# Patient Record
Sex: Female | Born: 1940
Health system: Southern US, Community
[De-identification: ages and names within clinical notes are randomized; demographics above are authoritative.]

## PROBLEM LIST (undated history)

## (undated) DIAGNOSIS — I358 Other nonrheumatic aortic valve disorders: Secondary | ICD-10-CM

## (undated) DIAGNOSIS — M40204 Unspecified kyphosis, thoracic region: Secondary | ICD-10-CM

## (undated) DIAGNOSIS — D539 Nutritional anemia, unspecified: Secondary | ICD-10-CM

## (undated) DIAGNOSIS — C9000 Multiple myeloma not having achieved remission: Principal | ICD-10-CM

## (undated) DIAGNOSIS — Z9289 Personal history of other medical treatment: Secondary | ICD-10-CM

## (undated) DIAGNOSIS — M810 Age-related osteoporosis without current pathological fracture: Secondary | ICD-10-CM

## (undated) DIAGNOSIS — G9520 Unspecified cord compression: Secondary | ICD-10-CM

## (undated) DIAGNOSIS — I341 Nonrheumatic mitral (valve) prolapse: Secondary | ICD-10-CM

## (undated) DIAGNOSIS — H269 Unspecified cataract: Secondary | ICD-10-CM

## (undated) DIAGNOSIS — Z923 Personal history of irradiation: Secondary | ICD-10-CM

## (undated) DIAGNOSIS — I5042 Chronic combined systolic (congestive) and diastolic (congestive) heart failure: Secondary | ICD-10-CM

## (undated) DIAGNOSIS — I429 Cardiomyopathy, unspecified: Secondary | ICD-10-CM

## (undated) DIAGNOSIS — I2510 Atherosclerotic heart disease of native coronary artery without angina pectoris: Secondary | ICD-10-CM

## (undated) DIAGNOSIS — I2119 ST elevation (STEMI) myocardial infarction involving other coronary artery of inferior wall: Principal | ICD-10-CM

## (undated) HISTORY — PX: CATARACT EXTRACTION, BILATERAL: SHX1313

## (undated) HISTORY — DX: Personal history of other medical treatment: Z92.89

## (undated) HISTORY — DX: Multiple myeloma not having achieved remission: C90.00

## (undated) HISTORY — DX: Other nonrheumatic aortic valve disorders: I35.8

## (undated) HISTORY — DX: Age-related osteoporosis without current pathological fracture: M81.0

## (undated) HISTORY — DX: Nutritional anemia, unspecified: D53.9

## (undated) HISTORY — DX: Nonrheumatic mitral (valve) prolapse: I34.1

## (undated) HISTORY — PX: APPENDECTOMY: SHX54

## (undated) HISTORY — PX: ELBOW SURGERY: SHX618

## (undated) HISTORY — DX: Cardiomyopathy, unspecified: I42.9

## (undated) HISTORY — PX: TUBAL LIGATION: SHX77

## (undated) HISTORY — DX: Unspecified cataract: H26.9

## (undated) HISTORY — DX: Chronic combined systolic (congestive) and diastolic (congestive) heart failure: I50.42

## (undated) HISTORY — DX: Atherosclerotic heart disease of native coronary artery without angina pectoris: I25.10

---

## 1998-09-26 ENCOUNTER — Other Ambulatory Visit: Admission: RE | Admit: 1998-09-26 | Discharge: 1998-09-26 | Payer: Self-pay | Admitting: *Deleted

## 1998-12-11 ENCOUNTER — Encounter: Admission: RE | Admit: 1998-12-11 | Discharge: 1998-12-11 | Payer: Self-pay | Admitting: Sports Medicine

## 2000-01-28 ENCOUNTER — Other Ambulatory Visit: Admission: RE | Admit: 2000-01-28 | Discharge: 2000-01-28 | Payer: Self-pay | Admitting: Obstetrics and Gynecology

## 2000-02-01 ENCOUNTER — Encounter: Admission: RE | Admit: 2000-02-01 | Discharge: 2000-02-01 | Payer: Self-pay | Admitting: Sports Medicine

## 2000-05-01 ENCOUNTER — Encounter: Admission: RE | Admit: 2000-05-01 | Discharge: 2000-05-01 | Payer: Self-pay | Admitting: Sports Medicine

## 2002-03-03 ENCOUNTER — Other Ambulatory Visit: Admission: RE | Admit: 2002-03-03 | Discharge: 2002-03-03 | Payer: Self-pay | Admitting: Obstetrics and Gynecology

## 2002-07-19 ENCOUNTER — Encounter: Payer: Self-pay | Admitting: Orthopedic Surgery

## 2002-07-19 ENCOUNTER — Emergency Department (HOSPITAL_COMMUNITY): Admission: EM | Admit: 2002-07-19 | Discharge: 2002-07-19 | Payer: Self-pay | Admitting: Emergency Medicine

## 2004-05-04 ENCOUNTER — Ambulatory Visit (HOSPITAL_COMMUNITY): Admission: RE | Admit: 2004-05-04 | Discharge: 2004-05-04 | Payer: Self-pay | Admitting: Gastroenterology

## 2004-05-04 ENCOUNTER — Encounter (INDEPENDENT_AMBULATORY_CARE_PROVIDER_SITE_OTHER): Payer: Self-pay | Admitting: Specialist

## 2005-04-14 ENCOUNTER — Encounter (INDEPENDENT_AMBULATORY_CARE_PROVIDER_SITE_OTHER): Payer: Self-pay | Admitting: *Deleted

## 2005-04-14 LAB — CONVERTED CEMR LAB

## 2005-07-15 HISTORY — PX: COLONOSCOPY: SHX174

## 2005-07-25 ENCOUNTER — Ambulatory Visit: Payer: Self-pay | Admitting: Family Medicine

## 2005-07-31 ENCOUNTER — Ambulatory Visit: Payer: Self-pay | Admitting: Family Medicine

## 2006-09-11 DIAGNOSIS — M1990 Unspecified osteoarthritis, unspecified site: Secondary | ICD-10-CM | POA: Insufficient documentation

## 2006-09-12 ENCOUNTER — Encounter (INDEPENDENT_AMBULATORY_CARE_PROVIDER_SITE_OTHER): Payer: Self-pay | Admitting: *Deleted

## 2007-07-16 HISTORY — PX: HIP SURGERY: SHX245

## 2007-09-07 ENCOUNTER — Encounter: Payer: Self-pay | Admitting: Family Medicine

## 2007-10-22 ENCOUNTER — Telehealth: Payer: Self-pay | Admitting: *Deleted

## 2007-10-22 ENCOUNTER — Ambulatory Visit: Payer: Self-pay | Admitting: Family Medicine

## 2007-10-22 ENCOUNTER — Encounter: Payer: Self-pay | Admitting: Family Medicine

## 2007-10-22 ENCOUNTER — Encounter: Admission: RE | Admit: 2007-10-22 | Discharge: 2007-10-22 | Payer: Self-pay | Admitting: Family Medicine

## 2007-10-22 LAB — CONVERTED CEMR LAB
Bacteria, UA: 2
Basophils Relative: 0 % (ref 0–1)
Blood in Urine, dipstick: NEGATIVE
Eosinophils Absolute: 0 10*3/uL (ref 0.0–0.7)
Eosinophils Relative: 0 % (ref 0–5)
Hemoglobin: 11 g/dL — ABNORMAL LOW (ref 12.0–15.0)
Lymphs Abs: 0.9 10*3/uL (ref 0.7–4.0)
MCHC: 33.2 g/dL (ref 30.0–36.0)
MCV: 103.4 fL — ABNORMAL HIGH (ref 78.0–100.0)
Monocytes Relative: 7 % (ref 3–12)
Neutrophils Relative %: 86 % — ABNORMAL HIGH (ref 43–77)
RBC: 3.2 M/uL — ABNORMAL LOW (ref 3.87–5.11)
Urobilinogen, UA: 0.2
WBC: 12.6 10*3/uL — ABNORMAL HIGH (ref 4.0–10.5)

## 2008-06-01 ENCOUNTER — Inpatient Hospital Stay (HOSPITAL_COMMUNITY): Admission: EM | Admit: 2008-06-01 | Discharge: 2008-06-07 | Payer: Self-pay | Admitting: Emergency Medicine

## 2009-06-02 ENCOUNTER — Encounter: Payer: Self-pay | Admitting: Family Medicine

## 2009-06-02 ENCOUNTER — Ambulatory Visit: Payer: Self-pay | Admitting: Family Medicine

## 2009-06-02 DIAGNOSIS — D539 Nutritional anemia, unspecified: Secondary | ICD-10-CM

## 2009-06-02 LAB — CONVERTED CEMR LAB: Pap Smear: NORMAL

## 2009-06-05 ENCOUNTER — Encounter: Payer: Self-pay | Admitting: Family Medicine

## 2009-06-05 LAB — CONVERTED CEMR LAB
ALT: 16 units/L (ref 0–35)
AST: 23 units/L (ref 0–37)
Albumin: 3.7 g/dL (ref 3.5–5.2)
CO2: 23 meq/L (ref 19–32)
Calcium: 8.7 mg/dL (ref 8.4–10.5)
Chloride: 106 meq/L (ref 96–112)
Cholesterol: 207 mg/dL — ABNORMAL HIGH (ref 0–200)
MCV: 109.2 fL — ABNORMAL HIGH (ref 78.0–100.0)
Platelets: 209 10*3/uL (ref 150–400)
RBC: 3.06 M/uL — ABNORMAL LOW (ref 3.87–5.11)
RDW: 14.1 % (ref 11.5–15.5)
Sodium: 139 meq/L (ref 135–145)
Total Bilirubin: 0.5 mg/dL (ref 0.3–1.2)
Total Protein: 7.7 g/dL (ref 6.0–8.3)
VLDL: 14 mg/dL (ref 0–40)

## 2009-08-25 ENCOUNTER — Telehealth: Payer: Self-pay | Admitting: Family Medicine

## 2010-01-04 ENCOUNTER — Telehealth: Payer: Self-pay | Admitting: Family Medicine

## 2010-01-11 ENCOUNTER — Telehealth: Payer: Self-pay | Admitting: Family Medicine

## 2010-03-01 ENCOUNTER — Telehealth: Payer: Self-pay | Admitting: Family Medicine

## 2010-03-02 ENCOUNTER — Ambulatory Visit: Payer: Self-pay | Admitting: Family Medicine

## 2010-03-02 DIAGNOSIS — S339XXA Sprain of unspecified parts of lumbar spine and pelvis, initial encounter: Secondary | ICD-10-CM | POA: Insufficient documentation

## 2010-03-02 DIAGNOSIS — S335XXA Sprain of ligaments of lumbar spine, initial encounter: Secondary | ICD-10-CM

## 2010-03-23 ENCOUNTER — Encounter: Payer: Self-pay | Admitting: Sports Medicine

## 2010-04-03 ENCOUNTER — Telehealth: Payer: Self-pay | Admitting: Family Medicine

## 2010-04-04 ENCOUNTER — Encounter (INDEPENDENT_AMBULATORY_CARE_PROVIDER_SITE_OTHER): Payer: Self-pay | Admitting: *Deleted

## 2010-04-04 ENCOUNTER — Telehealth: Payer: Self-pay | Admitting: Family Medicine

## 2010-04-05 ENCOUNTER — Encounter: Admission: RE | Admit: 2010-04-05 | Discharge: 2010-04-05 | Payer: Self-pay | Admitting: Sports Medicine

## 2010-04-06 ENCOUNTER — Telehealth (INDEPENDENT_AMBULATORY_CARE_PROVIDER_SITE_OTHER): Payer: Self-pay | Admitting: *Deleted

## 2010-05-17 ENCOUNTER — Encounter: Payer: Self-pay | Admitting: Sports Medicine

## 2010-05-23 ENCOUNTER — Ambulatory Visit: Payer: Self-pay | Admitting: Sports Medicine

## 2010-05-23 DIAGNOSIS — M4000 Postural kyphosis, site unspecified: Secondary | ICD-10-CM | POA: Insufficient documentation

## 2010-06-11 ENCOUNTER — Encounter: Payer: Self-pay | Admitting: Family Medicine

## 2010-06-11 DIAGNOSIS — M810 Age-related osteoporosis without current pathological fracture: Secondary | ICD-10-CM

## 2010-06-11 HISTORY — DX: Age-related osteoporosis without current pathological fracture: M81.0

## 2010-08-16 NOTE — Progress Notes (Signed)
  Phone Note Call from Patient Call back at Home Phone [phone] Call back at Work Phone [phone]   Caller: Patient Call For: Shannon Murphy NP Summary of Call: Back spasms Initial call taken by: Marily Memos  Follow-up for Phone Call        Tried to call and left a message, recommend physical therapy, there is an office at [facility]  Recommend 2 times per week for 4-6 weeks.  I will place order if patient agrees. Form is completed and on my desk. Follow-up by: Shannon Murphy NP,  April 03, 2010 8:27 AM

## 2010-08-16 NOTE — Miscellaneous (Signed)
Summary: LS spine xray  Clinical Lists Changes  Orders: Added new Test order of Radiology other (Radiology Other) - Signed

## 2010-08-16 NOTE — Progress Notes (Signed)
Summary: triage  Phone Note Call from Patient Call back at Home Phone [phone]   Caller: Patient Summary of Call: Pt having pain in upper back. Initial call taken by: Clydell Hakim,  March 01, 2010 4:15 PM  Follow-up for Phone Call        above bra strap. started last week when she was sanding her deck. keeping her awake at night. using ibu which does help. unable to weed in yard due to this. feels spasms & cramps along the "dermatone" per pt. she is reluctant to come in & asked about OTC muscle relaxers. told her they are rx meds. she agreed to see Dr. Jeanice Lim tomorrow pm Follow-up by: Golden Circle RN,  March 01, 2010 4:20 PM

## 2010-08-16 NOTE — Progress Notes (Signed)
Summary: phn msg  Phone Note Call from Patient Call back at Home Phone [phone] Call back at Work Phone [phone]   Caller: Patient Summary of Call: still interested in the study that Darl Pikes talked about at last visit - not sure if she was supposed to do something or if Darl Pikes was going to set that up.  pls advise Initial call taken by: De Nurse,  August 25, 2009 1:47 PM  Follow-up for Phone Call        called Follow-up by: Luretha Murphy NP,  August 28, 2009 2:25 PM

## 2010-08-16 NOTE — Assessment & Plan Note (Signed)
Summary: upper back pain x 1 wk/[place]/saxon   Vital Signs:  Patient profile:   70 year old female Height:      70.5 inches Weight:      133.9 pounds Pulse rate:   88 / minute BP sitting:   115 / 67  (right arm)  Vitals Entered By: Arlyss Repress CMA, (March 02, 2010 2:57 PM) CC: upper back pain after finishing deck. see note Is Patient Diabetic? No Pain Assessment Patient in pain? yes     Location: upper back Intensity: 2 Onset of pain  x 1 week   Primary Care Provider:  Luretha Murphy NP  CC:  upper back pain after finishing deck. see note.  History of Present Illness:   Pt was working on deck 1 week ago , a lot of bending and on knees a lot Since then has felt a spasm and tightening in lumbar area and lower thoracic region. Ibuprofen has helped pain, but it reoccurs. Feels stiff and tight when she gets up from a seated position or from lying to sitting. Denies injury, no paresthesia, no change in bowel or bladder habits  Habits & Providers  Alcohol-Tobacco-Diet     Tobacco Status: never  Current Medications (verified): 1)  Zostavax 04540 Unt/0.79ml Solr (Zoster Vaccine Live) .... Administer X1 2)  Actonel 35 Mg Tabs (Risedronate Sodium) .... Weekly 3)  Cyclobenzaprine Hcl 10 Mg Tabs (Cyclobenzaprine Hcl) .Marland Kitchen.. 1 By Mouth Three Times A Day As Needed Spasm  Allergies (verified): No Known Drug Allergies  Physical Exam  General:  Well-developed,well-nourished,in no acute distress; alert,appropriate and cooperative throughout examination Vital signs noted  Msk:  No deformity or scoliosis noted of thoracic or lumbar spine.   +paraspinal spasm, mild tenderness to palpation, relief with massage normal flexion extension of back, discomoft with lateral rotation in area above normal hip exam- internal/external rotation, hip rock, neg SLR  Neurologic:  Sensation in tact normal LE strength 5/5 bilat normal gait    Impression & Recommendations:  Problem # 1:  LUMBOSACRAL  STRAIN (ICD-846.0) Assessment New  supportive care, no red flags on exam muscle relaxant, NSAIDS exercise RTC if no improvement   Orders: [facility]- Est Level  3 (98119)  Complete Medication List: 1)  Zostavax 14782 Unt/0.19ml Solr (Zoster vaccine live) .... Administer x1 2)  Actonel 35 Mg Tabs (Risedronate sodium) .... Weekly 3)  Cyclobenzaprine Hcl 10 Mg Tabs (Cyclobenzaprine hcl) .Marland Kitchen.. 1 by mouth three times a day as needed spasm  Patient Instructions: 1)  Start the muscle relaxant 2)  Continue daily strectching 3)  If not improving come back to be re-evaluated Prescriptions: CYCLOBENZAPRINE HCL 10 MG TABS (CYCLOBENZAPRINE HCL) 1 by mouth three times a day as needed spasm  #40 x 0   Entered and Authorized by:   Milinda Antis MD   Signed by:   Milinda Antis MD on 03/02/2010   Method used:   Electronically to        [facility] [address] [phone][facility] (retail)       [address]       [place], [place]  [address]       Ph: [phone] or [phone]       Fax: [phone]   RxID:   [id number]

## 2010-08-16 NOTE — Miscellaneous (Signed)
  Clinical Lists Changes  Problems: Removed problem of OSTEOPENIA (ICD-733.90) - Signed Added new problem of OSTEOPOROSIS (ICD-733.00) - Signed Removed problem of PREVENTIVE HEALTH CARE (ICD-V70.0) - Signed Removed problem of FEVER, HX OF (ICD-V15.9) - Signed Removed problem of SCREENING OTHER&UNSPEC CARDIOVASCULAR CONDITIONS (ICD-V81.2) - Signed Removed problem of COUGH (ICD-786.2) - Signed Observations: Added new observation of BONE DENSITY: Compression deformities of T8 and T9.  T score -2.8 at LS spine. (03/23/2010 10:25)    Lynden Ang MD-GYN  Bone Density  Procedure date:  03/23/2010  Findings:      Compression deformities of T8 and T9.  T score -2.8 at LS spine.

## 2010-08-16 NOTE — Progress Notes (Signed)
Summary: triage  Phone Note Call from Patient Call back at Home Phone [phone]   Caller: Patient Summary of Call: Her and her husband [name] 09[birth year]41 are both having some digestive problems and wondering if they can be seen today. Initial call taken by: Clydell Hakim,  January 11, 2010 8:38 AM  Follow-up for Phone Call        both have diarrhea. no vomiting or fever. some nausea x 3 wks. they have not sought medical care yet. tried immodium which helped until effects wore off.  feels washed out at times.  she is worse today & spouse is fine. yesterday they both felt fine. we do not have any openings today. she does not want to wait until tomorrow. states she will go to UC. read her pcp note that will need stool samples to check for O& A. she wants something to cure her today. leaving for [place] saturday. states if they do not give her anything, she "might as well go in [place]" she had called last week about this & relayed that they had been in [place] recently. did not go to UC at that time Follow-up by: Golden Circle RN,  January 11, 2010 9:01 AM

## 2010-08-16 NOTE — Progress Notes (Signed)
  Phone Note Call from Patient   Caller: Patient Summary of Call: Ms. Burdick calling back to give more infor regarding visit and referral to PT.    Dr Darrick Penna will want and xray to further study the fx seen on Bone Density scan received from ob/gyn office.  Will be going out town Friday and back at end of Oct.  Any further tests will have to wait until.  We see Dr. Darrick Penna also at that time.  Thank you for your return phone call.    Any advice you may have as what to avoid, feel free to call back Initial call taken by: Britta Mccreedy mcgregor

## 2010-08-16 NOTE — Assessment & Plan Note (Signed)
Summary: POST BONE DENSITY SCAN   Vital Signs:  Patient profile:   70 year old female Height:      70 inches Weight:      130 pounds BMI:     18.72 BP sitting:   160 / 80  Vitals Entered By: Lillia Pauls CMA (May 23, 2010 9:57 AM) CC: discuss x-rays & labs, Low back pain, Kyphosis   Primary Provider:  Luretha Murphy NP  CC:  discuss x-rays & labs, Low back pain, and Kyphosis.  History of Present Illness: 70 y/o F who comes in with questions about her x-rays and lab work. She has had some c-telopeptide testing that was within the normal range. All other labs appear normal but her Vit D is on the low side of normal. She just had a bone density scan but we don't have the results for tha. She is concerned about osteopenia which she has been treated for the last 5 years with Actonel. She is now considering Prolia (twice a year injection monoclonal antibody) for treatement of osteopenia. Her biggest questions are what exercises can she do or should she do?   Low back pain: Pt has some low back pain that developed in Sept after playing basketball. Reviewed x-ray of lumbar spine with her. Soem degenerative changes as well as arthritis and spurring noted and discussed wtih patient.   Kyphosis: Pt has some mid back pain and kyphosis. She had significant pain in Aug but says that pain is mostly gone now. She is however, concerned about the curvature in her back and wants to know if there is anything that can help it.   Preventive Screening-Counseling & Management  Alcohol-Tobacco     Smoking Status: never  Allergies (verified): No Known Drug Allergies  Review of Systems       some rib pain, some low back pain, some upper back pain when she leans on it.   Physical Exam  General:  Well-developed,well-nourished,in no acute distress; alert,appropriate and cooperative throughout examination Msk:  kyphosis, joint protrusion at about T7, some rotation of the hips forward,   norm Hip ROM  bilat  neg slr and norm strength back extension to 20 deg full flexion pelvic rotation to forward position inc her lordosis   Impression & Recommendations:  Problem # 1:  LUMBOSACRAL STRAIN (ICD-846.0) Assessment Unchanged Pt had some lumbar strain the last time she played basketball. She does have some arthitic changes in her low back (spurring) but can continue to play basketball as her comfort level allows.   add knee to chest and to opp shoulder as needed w other exercises  reck 6 wks or so  Problem # 2:  KYPHOSIS (ICD-737.10) Assessment: Unchanged  Pt has kyphosis that I suspect can be at least partially reversed with some exercises. Pt given detailed handouts and instructions for exercises to increased muscle mass in her back.   Problem # 3:  OSTEOPENIA (ICD-733.90) Assessment: Unchanged Pt had questions about Prolia. Suggested she follow up with erh Ob/GYN MD about letting her try some conservative treatment (exercises given) and hold off on the Prolia for now. Will be happy to discuss with GYN MD.   Her updated medication list for this problem includes:    Actonel 35 Mg Tabs (Risedronate sodium) .Marland Kitchen... Weekly  Complete Medication List: 1)  Zostavax 16109 Unt/0.42ml Solr (Zoster vaccine live) .... Administer x1 2)  Actonel 35 Mg Tabs (Risedronate sodium) .... Weekly 3)  Cyclobenzaprine Hcl 10 Mg Tabs (Cyclobenzaprine  hcl) .... 1 by mouth three times a day as needed spasm  Patient Instructions: 1)  It's ok to keep playing basketball but if it hurts you need to back off.  2)  Do scapular exercises with light weights most days of the week. Do the lawn mower and robbery 3 sets with 3 lbs weights.  3)  Also do Fly exercises at chest level until shoulderblades touch.  4)  Do postural relaxation (lay down and flaten spine) hold for 5 deep breaths, repeat 3 times.  5)  Make sure you are getting in at least 800 IU of Vit D, and about 1500 mg of Calcium 6)  Do walking 30-45 min a  day most days a week.    Orders Added: 1)  Est. Patient Level IV [16109]

## 2010-08-16 NOTE — Progress Notes (Signed)
  Phone Note Outgoing Call   Summary of Call: called pt and left messg that we found - osteopenia as she found out on BMD no compression fxs but a fair amount of arthritic change disks are OK Initial call taken by: Lillia Pauls CMA,  April 06, 2010 11:04 AM

## 2010-08-16 NOTE — Progress Notes (Signed)
Summary: triage  Phone Note Call from Patient Call back at Home Phone [phone]   Caller: Patient Summary of Call: has had diarrhea for about 2 weeks off and on for both she and her husband Lyman Bishop)  his worse that hers but not all the time. did get back from [place] the first part of June, not sure if that has something to do with it. pls advise Initial call taken by: De Nurse,  January 04, 2010 9:48 AM  Follow-up for Phone Call        they have both tried immodium.   wife has it infrequently now spouse is much worse. the immodim slowed it but now this am it is worse again.  they have both lost weight from this.  have been in [place], home & [place] . offered appt tomorrow or go to UC today. we have no appts left for today. her spouse is not home so she will talk about it & then decide what she wants to do Follow-up by: Golden Circle RN,  January 04, 2010 9:58 AM  Additional Follow-up for Phone Call Additional follow up Details #1::        Will need to come in and be seen, have stools sent for O&P. Likely [place] diarrhea. Additional Follow-up by: Luretha Murphy NP,  January 04, 2010 3:58 PM

## 2010-08-22 ENCOUNTER — Encounter: Payer: Self-pay | Admitting: *Deleted

## 2010-09-13 ENCOUNTER — Encounter: Payer: Self-pay | Admitting: Home Health Services
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
## 2010-11-30 NOTE — Op Note (Signed)
NAME:  Shannon Rush, Shannon Rush                ACCOUNT NO.:  [id number]   MEDICAL RECORD NO.:  [id number]          PATIENT TYPE:  AMB   LOCATION:  ENDO                         FACILITY:  [facility]   PHYSICIAN:  Petra Kuba, M.D.    DATE OF BIRTH:  1940/07/24   DATE OF PROCEDURE:  05/04/2004  DATE OF DISCHARGE:                                 OPERATIVE REPORT   PROCEDURE:  Colonoscopy with polypectomy.   INDICATIONS:  Screening. Consent was signed after risks, benefits, methods,  and options thoroughly discussed in the office.   MEDICINES USED:  None. She preferred an unsedated procedure.   PROCEDURE NOTE:  Rectal inspection pertinent for external hemorrhoids,  small. Digital exam was negative. Pediatric video adjustable colonoscope was  inserted, and with abdominal pressure, advanced around the colon to the  cecum. On insertion, a tiny sigmoid polyp was seen as well as at  approximately the level of hepatic flexure a small pedunculated polyp. No  other abnormalities were seen on insertion. The cecum was identified by the  appendiceal orifice and ileocecal valve. Prep was adequate. There was some  liquid stool that required washing and suctioning, and she did have some  debris which clogged the scope periodically when we suctioned them. She did  have a tortuous colon, and we fell back around a loop, we did try to  readvance around the curves to decrease chances of missing things on slow  withdraw. No addition findings were seen other than the polyps mentioned  above. The pedunculated polyp in the hepatic flexure was snared,  electrocautery applied. Polyp was suctioned through the scope and collected  in the trap. Mid sigmoid polyp was seen and hot biopsied and put in a  separate container. No other abnormalities were seen as we slowly withdrew  back to the rectum. Anorectal pull through and retroflexion confirmed some  small hemorrhoids. The scope was straightened and readvanced short ways up  the left side of the colon. Air was suctioned. Scope removed. The patient  tolerated the procedure well. There were no obvious immediate complications.   ENDOSCOPIC DIAGNOSES:  1.  Internal and external hemorrhoids.  2.  Tiny mid sigmoid polyp, hot biopsied.  3.  Hepatic flexure small pedunculated polyp, snared.  4.  Tortuous colon.  5.  Otherwise within normal limits to the cecum.   PLAN:  Await pathology to determine further colonic screening. Happy to see  back p.r.n. Otherwise return to the care of Dr. __________ for customary  health care maintenance to include yearly rectals and guaiacs.      MEM/MEDQ  D:  05/04/2004  T:  05/04/2004  Job:  [id number]
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
## 2010-12-26 ENCOUNTER — Ambulatory Visit (INDEPENDENT_AMBULATORY_CARE_PROVIDER_SITE_OTHER): Payer: Medicare Other | Admitting: Home Health Services

## 2010-12-26 ENCOUNTER — Encounter: Payer: Self-pay | Admitting: Home Health Services

## 2010-12-26 VITALS — BP 137/82 | Temp 98.5°F | Ht 70.0 in | Wt 133.0 lb

## 2010-12-26 DIAGNOSIS — Z0000 Encounter for general adult medical examination without abnormal findings: Secondary | ICD-10-CM

## 2010-12-26 NOTE — Patient Instructions (Signed)
1. Consider getting shingles vaccine at pharmacy. 2. If shoulder pain is persistent and/or pain increases schedule an appointment with Dr. Darrick Penna.  3. Bring in copy of medical wishes to [facility]. 4. If you have a record of when you got your pneumonia vaccine, please bring a copy to [place].

## 2010-12-26 NOTE — Progress Notes (Signed)
Patient here for annual wellness visit, patient reports: Risk Factors/Conditions needing evaluation or treatment: Patient does not have any risk factors that need evaluation. Home Safety: Patient lives with spouse in 2 story home.  Patient reports having smoke detectors and adaptive equipment in the bathroom. Other Information: Corrective lens: Pt reports wearing corrective lens daily and visits eye doctor every 2 years. Dentures: Patient does not have dentures and visits dentist every 6 months. Memory: Patient reports some memory problems.  Patient's Mini Mental Score (recorded in doc. flowsheet): 30  Balance/Gait: Patient does not have any noticeable impairments.  Balance Abnormal Patient value  Sitting balance    Sit to stand    Attempts to arise    Immediate standing balance    Standing balance    Nudge    Eyes closed- Romberg    Tandem stance    Back lean    Neck Rotation    360 degree turn    Sitting down     Gait Abnormal Patient value  Initiation of gait    Step length-left    Step length-right    Step height-left    Step height-right    Step symmetry    Step continuity    Path deviation    Trunk movement    Walking stance        Annual Wellness Visit Requirements Recorded Today In  Medical, family, social history Past Medical, Family, Social History Section  Current providers Care team  Current medications Medications  Wt, BP, Ht, BMI Vital signs  Hearing assessment (welcome visit) declined  Tobacco, alcohol, illicit drug use History  ADL Nurse Assessment  Depression Screening Nurse Assessment  Cognitive impairment Nurse Assessment  Mini Mental Status Document Flowsheet  Fall Risk Nurse Assessment  Home Safety Progress Note  End of Life Planning (welcome visit) Social Documentation  Medicare preventative services Progress Note  Risk factors/conditions needing evaluation/treatment Progress Note  Personalized health advice Patient Instructions, goals,  letter  Diet & Exercise Social Documentation  Emergency Contact Social Documentation  Seat Belts Social Documentation  Sun exposure/protection Social Documentation    Prevention Plan: Recommended pt consider getting shingles vaccine.   Recommended Medicare Prevention Screenings Women over 27 Test For Frequency Date of Last- BOLD if needed  Breast Cancer 1-2 yrs 2012- pt reported  Cervical Cancer 1-3 yrs Not indicated  Colorectal Cancer 1-10 yrs 2/09  Osteoporosis once 9/11  Cholesterol 5 yrs 11/10  Diabetes yearly Non diabetic  HIV yearly declined  Influenza Shot yearly 2011- pt reported  Pneumonia Shot once Done-pt reported  Zostavax Shot once recommended

## 2010-12-27 ENCOUNTER — Encounter: Payer: Self-pay | Admitting: Home Health Services

## 2010-12-27 NOTE — Progress Notes (Signed)
  Subjective:    Patient ID: Shannon Rush, female    DOB: [birth year], 70 y.o.   MRN: [id number]  HPI    Review of Systems     Objective:   Physical Exam        Assessment & Plan:  Reviewed with Arlys John, and agree with assessment and plan

## 2011-03-31 ENCOUNTER — Emergency Department (HOSPITAL_COMMUNITY)
Admission: EM | Admit: 2011-03-31 | Discharge: 2011-04-01 | Disposition: A | Discharge status: Home / Self Care | Payer: Medicare Other | Attending: Emergency Medicine | Admitting: Emergency Medicine

## 2011-03-31 DIAGNOSIS — S2220XA Unspecified fracture of sternum, initial encounter for closed fracture: Secondary | ICD-10-CM | POA: Insufficient documentation

## 2011-03-31 DIAGNOSIS — M949 Disorder of cartilage, unspecified: Secondary | ICD-10-CM | POA: Insufficient documentation

## 2011-03-31 DIAGNOSIS — M899 Disorder of bone, unspecified: Secondary | ICD-10-CM | POA: Insufficient documentation

## 2011-03-31 DIAGNOSIS — R079 Chest pain, unspecified: Secondary | ICD-10-CM | POA: Insufficient documentation

## 2011-03-31 DIAGNOSIS — Y93H2 Activity, gardening and landscaping: Secondary | ICD-10-CM | POA: Insufficient documentation

## 2011-03-31 DIAGNOSIS — X503XXA Overexertion from repetitive movements, initial encounter: Secondary | ICD-10-CM | POA: Insufficient documentation

## 2011-03-31 DIAGNOSIS — Y92009 Unspecified place in unspecified non-institutional (private) residence as the place of occurrence of the external cause: Secondary | ICD-10-CM | POA: Insufficient documentation

## 2011-04-01 ENCOUNTER — Encounter (HOSPITAL_COMMUNITY): Payer: Self-pay

## 2011-04-01 ENCOUNTER — Emergency Department (HOSPITAL_COMMUNITY): Payer: Medicare Other

## 2011-04-01 ENCOUNTER — Encounter: Payer: Self-pay | Admitting: Family Medicine

## 2011-04-01 LAB — BASIC METABOLIC PANEL
CO2: 26 mEq/L (ref 19–32)
Calcium: 9.3 mg/dL (ref 8.4–10.5)
Creatinine, Ser: 0.55 mg/dL (ref 0.50–1.10)
GFR calc Af Amer: >60 mL/min (ref >60)
Sodium: 134 mEq/L — ABNORMAL LOW (ref 135–145)

## 2011-04-01 LAB — CBC
MCHC: 33.8 g/dL (ref 30.0–36.0)
RBC: 3.04 MIL/uL — ABNORMAL LOW (ref 3.87–5.11)
RDW: 14.6 % (ref 11.5–15.5)
WBC: 9.4 10*3/uL (ref 4.0–10.5)

## 2011-04-01 LAB — DIFFERENTIAL
Eosinophils Relative: 0 % (ref 0–5)
Lymphocytes Relative: 10 % — ABNORMAL LOW (ref 12–46)
Monocytes Absolute: 0.5 10*3/uL (ref 0.1–1.0)
Monocytes Relative: 5 % (ref 3–12)
Neutro Abs: 7.9 10*3/uL — ABNORMAL HIGH (ref 1.7–7.7)

## 2011-04-01 MED ORDER — IOHEXOL 300 MG/ML  SOLN
80.0000 mL | Freq: Once | INTRAMUSCULAR | Status: AC | PRN
  Administered 2011-04-01: 80 mL via INTRAVENOUS

## 2011-04-03 ENCOUNTER — Other Ambulatory Visit: Payer: Self-pay | Admitting: *Deleted

## 2011-04-03 MED ORDER — HYDROCODONE-ACETAMINOPHEN 5-325 MG PO TABS: 1.0000 | ORAL_TABLET | Freq: Four times a day (QID) | ORAL | Status: AC | PRN

## 2011-04-03 NOTE — Progress Notes (Signed)
Med called in for pt per Dr. Darrick Penna as pt has sternum fracture experienced when she was pulling weeds- went to ED and is in significant pain.  Pt scheduled to follow up here at our office.

## 2011-04-08 ENCOUNTER — Encounter: Payer: Self-pay | Admitting: Family Medicine

## 2011-04-08 ENCOUNTER — Ambulatory Visit (INDEPENDENT_AMBULATORY_CARE_PROVIDER_SITE_OTHER): Payer: Medicare Other | Admitting: Family Medicine

## 2011-04-08 VITALS — BP 165/79 | HR 91 | Temp 98.7°F | Wt 128.9 lb

## 2011-04-08 DIAGNOSIS — M810 Age-related osteoporosis without current pathological fracture: Secondary | ICD-10-CM

## 2011-04-08 DIAGNOSIS — D649 Anemia, unspecified: Secondary | ICD-10-CM

## 2011-04-08 DIAGNOSIS — J189 Pneumonia, unspecified organism: Secondary | ICD-10-CM | POA: Insufficient documentation

## 2011-04-08 LAB — VITAMIN B12: Vitamin B-12: 575 pg/mL (ref 211–911)

## 2011-04-08 MED ORDER — MOXIFLOXACIN HCL 400 MG PO TABS: 400.0000 mg | ORAL_TABLET | Freq: Every day | ORAL | Status: AC

## 2011-04-08 NOTE — Assessment & Plan Note (Signed)
B12 last check 2 years ago, will check again today with MMA

## 2011-04-08 NOTE — Assessment & Plan Note (Signed)
She plans to see [facility] in consultation for recommendations for alternative treatment,
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
## 2011-04-08 NOTE — Patient Instructions (Signed)
HCTZ 12.5 mg recommend Avelox 400 mg daily for 7 days for pneumonia  I will let you know about the B12 and MMA results [place]man for osteoporosis treatment Diet high in potassium Aspirin 81 mg daily Follow up with Dr. Leveda Anna end of October

## 2011-04-08 NOTE — Assessment & Plan Note (Signed)
Suspect with sternal fracture and chest wall pain she has been guarding, she also believes that she had contracted a respiratory illness prior to all of this.  CT angio showed bilateral lower lobe densities.  Will treat with Avelox for 7 days.

## 2011-04-11 ENCOUNTER — Encounter: Payer: Self-pay | Admitting: Family Medicine

## 2011-04-11 LAB — METHYLMALONIC ACID, SERUM: Methylmalonic Acid, Quantitative: 213 nmol/L (ref 87–318)

## 2011-04-17 LAB — FOLATE RBC: RBC Folate: 914 — ABNORMAL HIGH

## 2011-04-17 LAB — COMPREHENSIVE METABOLIC PANEL
AST: 47 — ABNORMAL HIGH
Albumin: 2.3 — ABNORMAL LOW
Albumin: 2.5 — ABNORMAL LOW
BUN: 9
CO2: 29
Calcium: 8.5
Creatinine, Ser: 0.5
Creatinine, Ser: 0.59
GFR calc Af Amer: >60
GFR calc non Af Amer: >60
Total Protein: 6.4

## 2011-04-17 LAB — BASIC METABOLIC PANEL
BUN: 11
BUN: 3 — ABNORMAL LOW
BUN: 6
CO2: 24
CO2: 25
Calcium: 7.9 — ABNORMAL LOW
Chloride: 105
Chloride: 105
Creatinine, Ser: 0.54
Creatinine, Ser: 0.6
Creatinine, Ser: 0.61
GFR calc Af Amer: >60
GFR calc Af Amer: >60
GFR calc non Af Amer: >60
Glucose, Bld: 114 — ABNORMAL HIGH
Glucose, Bld: 119 — ABNORMAL HIGH
Sodium: 135

## 2011-04-17 LAB — CBC
HCT: 31 — ABNORMAL LOW
HCT: 34.2 — ABNORMAL LOW
MCHC: 35.2
MCHC: 35.3
MCV: 102.1 — ABNORMAL HIGH
MCV: 106.3 — ABNORMAL HIGH
Platelets: 157
Platelets: 168
Platelets: 169
Platelets: 206
RBC: 2.42 — ABNORMAL LOW
RDW: 13.9
RDW: 18.2 — ABNORMAL HIGH
WBC: 6.4

## 2011-04-17 LAB — TYPE AND SCREEN: ABO/RH(D): A POS

## 2011-04-17 LAB — URINALYSIS, ROUTINE W REFLEX MICROSCOPIC
Bilirubin Urine: NEGATIVE
Glucose, UA: NEGATIVE
Hgb urine dipstick: NEGATIVE
Nitrite: NEGATIVE
Protein, ur: NEGATIVE
Specific Gravity, Urine: >1.046 — ABNORMAL HIGH
pH: 6.5

## 2011-04-17 LAB — CK: Total CK: 233 — ABNORMAL HIGH

## 2011-04-17 LAB — POCT I-STAT, CHEM 8
Calcium, Ion: 1.15
Chloride: 104
HCT: 35 — ABNORMAL LOW
Hemoglobin: 11.9 — ABNORMAL LOW

## 2011-04-17 LAB — LIPASE, BLOOD: Lipase: 14

## 2011-04-17 LAB — HEMOGLOBIN AND HEMATOCRIT, BLOOD: HCT: 32.4 — ABNORMAL LOW

## 2011-06-03 ENCOUNTER — Encounter: Payer: Self-pay | Admitting: *Deleted

## 2011-06-03 ENCOUNTER — Ambulatory Visit (INDEPENDENT_AMBULATORY_CARE_PROVIDER_SITE_OTHER): Payer: Medicare Other | Admitting: Sports Medicine

## 2011-06-03 ENCOUNTER — Other Ambulatory Visit: Payer: Self-pay | Admitting: *Deleted

## 2011-06-03 VITALS — BP 130/70

## 2011-06-03 DIAGNOSIS — M810 Age-related osteoporosis without current pathological fracture: Secondary | ICD-10-CM

## 2011-06-03 DIAGNOSIS — M5432 Sciatica, left side: Secondary | ICD-10-CM | POA: Insufficient documentation

## 2011-06-03 DIAGNOSIS — M5430 Sciatica, unspecified side: Secondary | ICD-10-CM

## 2011-06-03 MED ORDER — GABAPENTIN 300 MG PO CAPS: ORAL_CAPSULE | ORAL | Status: DC

## 2011-06-03 NOTE — Patient Instructions (Addendum)
Continue doing scapular exercises and stretches daily  Start hip exercises and piriformis stretching daily  Start 1 gabapentin 300 mg by mouth at bedtime  Please follow up in 1 month  Thank you for see [place] today!

## 2011-06-03 NOTE — Assessment & Plan Note (Signed)
This is very limiting so we will give her a trial of gabapentin for one month Recheck after this  Begin exercises and stretches to see if we can loosen the piriformis

## 2011-06-03 NOTE — Progress Notes (Signed)
  Subjective:    Patient ID: Shannon Rush, female    DOB: 1940-12-12, 70 y.o.   MRN: [id number]  HPI  Pt presents to clinic for f/u of osteoporosis and pain in her left buttocks.  specifically having pain over left buttock and radiates down lateral left leg.  Pain is worse first thing in the mornings and late at night.  Riding exercise bike improves pain.    She does not know a specific injury but she did start noticing more pain when sitting for a long period of time.  She does not have low back pain or history of disc problems in the low back  She is on forteo injections for osteoporosis  Has not noticed any side effects r/t the injections. Scapular strengthening exercises up to 3 sets with 8lbs  Sternal fracture is improved.  She is working on posture and trying to keep from getting worse kyphosis of her spine   Review of Systems     Objective:   Physical Exam nad  Rt Hip- 45 degrees IR, 30 deg ER seated Lt hip  30 deg ER, 30 deg IR seated   Rt hip -30 deg IR, 45 deg ER lying Lt hip- 25 deg ER, 30 deg  IR lying  Hip flexion nml bilat straight leg raise normal bilat  Direct tenderness over piriformis on lt, not painful on rt  No pain with ER and is strong on lt Abduction strong  Kyphosis in back is unchanged but she is able to get better back extension and can do this without getting any back pain      Assessment & Plan:

## 2011-06-03 NOTE — Assessment & Plan Note (Signed)
Continue to work on extension exercises for the upper back and on scapular strengthening exercises

## 2011-07-03 ENCOUNTER — Ambulatory Visit (INDEPENDENT_AMBULATORY_CARE_PROVIDER_SITE_OTHER): Payer: Medicare Other | Admitting: Sports Medicine

## 2011-07-03 VITALS — BP 120/70

## 2011-07-03 DIAGNOSIS — M5432 Sciatica, left side: Secondary | ICD-10-CM

## 2011-07-03 DIAGNOSIS — M5430 Sciatica, unspecified side: Secondary | ICD-10-CM

## 2011-07-03 MED ORDER — GABAPENTIN 800 MG PO TABS: ORAL_TABLET | ORAL | Status: DC

## 2011-07-03 NOTE — Progress Notes (Signed)
  Subjective:    Patient ID: Shannon Rush, female    DOB: 1940-10-17, 70 y.o.   MRN: [id number]  HPI Left-sided sciatica for approximately 3 months now. She's been on gabapentin currently at 600 mg at bedtime. She notes that her pain is particularly bad at night and for the first few hours in the morning, but really not been through the day. She is really having no side effects from the medicine. She's been only moderately compliant with her exercises and her stretches, and is approximately 20% improved.   Review of Systems    no fevers, chills, night sweats, weight loss. Objective:   Physical Exam General:  Well developed, well nourished, and in no acute distress. Neuro:  Alert and oriented x3, extra-ocular muscles intact. Skin: Warm and dry. Respiratory:  Not using accessory muscles, speaking in full sentences. MSK: Tender to palpation at the left sciatic notch. Negative straight leg raise. Good strength, sensation, as well as reflexes bilateral and symmetric. Reproduction of pain with abduction stretch.     Assessment & Plan:    Left Sided sciatica: Increasing gabapentin to 800 mg at bedtime, she will increase her compliance with her rehabilitation exercises, she will also do the tennis ball massage. At the next visit if still not sufficiently improved we can consider formal physical therapy. Afterwards if still not improved I can do an ultrasound guided hydrodissection of the sciatic nerve. We will see her back in a period of 6 weeks.

## 2011-07-03 NOTE — Patient Instructions (Signed)
Great to see you. Increasing gabapentin to 800mg  at bedtime. Use tennis ball massage as discussed. Please increase frequency of stretching and strengthening. Come back to see [place] in 6 weeks.

## 2012-05-07 ENCOUNTER — Telehealth: Payer: Self-pay | Admitting: *Deleted

## 2012-05-07 ENCOUNTER — Telehealth: Payer: Self-pay | Admitting: Family Medicine

## 2012-05-07 NOTE — Telephone Encounter (Signed)
Patient has tried ibuprofen also. Advised other than these OTC meds nothing else to do now. Advised if pain worsens and she cannot wait until morning to go to ED. States pain started a week ago.

## 2012-05-07 NOTE — Telephone Encounter (Signed)
Patient is calling complaining about a stitch/cramp on her right side, just under her rib cage.  She is scheduled to be seen tomorrow morning by Dr. Leveda Anna, but would like a suggestion for what she can do tonight.  She has tried Naprosyn which normally works for her back pain, but is not helping for this.  She has also tried putting heat on it.

## 2012-05-07 NOTE — Telephone Encounter (Signed)
Spoke with pt- she states she is having right side cramping that is intermittent x 4-5 days.  Taking naproxen, but not significantly helpful. Per Dr. Darrick Penna advised pt to get evaluation by PCP to make sure this issue is not GI or Kidney related.  Pt agreeable.

## 2012-05-08 ENCOUNTER — Encounter: Payer: Self-pay | Admitting: Family Medicine

## 2012-05-08 ENCOUNTER — Ambulatory Visit (INDEPENDENT_AMBULATORY_CARE_PROVIDER_SITE_OTHER): Payer: Medicare Other | Admitting: Family Medicine

## 2012-05-08 VITALS — BP 156/87 | HR 81 | Temp 97.9°F | Ht 67.6 in | Wt 128.5 lb

## 2012-05-08 DIAGNOSIS — R52 Pain, unspecified: Secondary | ICD-10-CM

## 2012-05-08 DIAGNOSIS — R1011 Right upper quadrant pain: Secondary | ICD-10-CM | POA: Insufficient documentation

## 2012-05-08 DIAGNOSIS — M546 Pain in thoracic spine: Secondary | ICD-10-CM

## 2012-05-08 LAB — COMPLETE METABOLIC PANEL WITH GFR
ALT: 15 U/L (ref 0–35)
AST: 21 U/L (ref 0–37)
Albumin: 3.3 g/dL — ABNORMAL LOW (ref 3.5–5.2)
Alkaline Phosphatase: 77 U/L (ref 39–117)
BUN: 20 mg/dL (ref 6–23)
CO2: 27 mEq/L (ref 19–32)
Calcium: 9.4 mg/dL (ref 8.4–10.5)
Chloride: 100 mEq/L (ref 96–112)
Creat: 0.88 mg/dL (ref 0.50–1.10)
GFR, Est African American: 78 mL/min
GFR, Est Non African American: 67 mL/min
Glucose, Bld: 85 mg/dL (ref 70–99)
Sodium: 133 mEq/L — ABNORMAL LOW (ref 135–145)
Total Bilirubin: 0.6 mg/dL (ref 0.3–1.2)
Total Protein: 8.4 g/dL — ABNORMAL HIGH (ref 6.0–8.3)

## 2012-05-08 LAB — CBC
HCT: 30.7 % — ABNORMAL LOW (ref 36.0–46.0)
Hemoglobin: 10.8 g/dL — ABNORMAL LOW (ref 12.0–15.0)
MCH: 35 pg — ABNORMAL HIGH (ref 26.0–34.0)
MCHC: 35.2 g/dL (ref 30.0–36.0)
MCV: 99.4 fL (ref 78.0–100.0)
Platelets: 232 10*3/uL (ref 150–400)
RBC: 3.09 MIL/uL — ABNORMAL LOW (ref 3.87–5.11)
RDW: 14.8 % (ref 11.5–15.5)
WBC: 4.9 10*3/uL (ref 4.0–10.5)

## 2012-05-08 MED ORDER — TRAMADOL HCL 50 MG PO TABS: 50.0000 mg | ORAL_TABLET | Freq: Three times a day (TID) | ORAL | Status: DC | PRN

## 2012-05-08 NOTE — Progress Notes (Signed)
  Subjective:    Patient ID: Shannon Rush, female    DOB: 07-08-1941, 71 y.o.   MRN: [id number]  HPI Send results to Dr. Ernestina Penna at [facility] Right upper quadrant pain for 2 weeks. No nausea.  No clear pattern with foods.  Worse with movements.  No trauma.  Known osteoposis.  Hx of thoracic compression fractures.  No recent increase in back pain.    Review of Systems     Objective:   Physical Exam  Lungs clear. Abd pain is centered below the ribs.  Epigastric Rt>Lt.  No rebound or masses. Some thoracic spine tenderness, but not exquisite.        Assessment & Plan:

## 2012-05-08 NOTE — Assessment & Plan Note (Signed)
Unclear whether musculoskeletal or GI.  Will start with analgesics, and labs.  Depending on how she does over WE, consider imaging (perhaps thoracic spine, perhaps abd ultrasound.)  Also imperic H2 blocker.

## 2012-05-08 NOTE — Patient Instructions (Signed)
Please check on when your last tetanus was Please research calcium and forteo - I recommend both You got your flu shot today.  Please get some over the counter Pepcid and take once daily to see if it helps I sent in a prescription for pain pills.  I will call Monday with lab results.  Pay attention to your symptoms over the weekend and see if you can tell me anything more. You may need some sort of imaging - we just need to decide what type

## 2012-05-11 DIAGNOSIS — M546 Pain in thoracic spine: Secondary | ICD-10-CM | POA: Insufficient documentation

## 2012-05-11 NOTE — Assessment & Plan Note (Signed)
Called with labs.  She thinks a thoracic back pain is the likely cause of the abd discomfort she has be having.  The high total protein and low serum albumen suggests the possibility of multiple myeloma.  Will get SPEP

## 2012-05-11 NOTE — Addendum Note (Signed)
Addended by: Tivis Ringer on: 05/11/2012 04:36 PM   Modules accepted: Orders

## 2012-05-18 ENCOUNTER — Emergency Department (INDEPENDENT_AMBULATORY_CARE_PROVIDER_SITE_OTHER): Payer: Medicare Other

## 2012-05-18 ENCOUNTER — Encounter (HOSPITAL_COMMUNITY): Payer: Self-pay | Admitting: Emergency Medicine

## 2012-05-18 ENCOUNTER — Emergency Department (HOSPITAL_COMMUNITY)
Admission: EM | Admit: 2012-05-18 | Discharge: 2012-05-18 | Disposition: A | Discharge status: Home / Self Care | Payer: Medicare Other | Source: Home / Self Care | Attending: Family Medicine | Admitting: Family Medicine

## 2012-05-18 ENCOUNTER — Telehealth: Payer: Self-pay | Admitting: Family Medicine

## 2012-05-18 DIAGNOSIS — S52509A Unspecified fracture of the lower end of unspecified radius, initial encounter for closed fracture: Secondary | ICD-10-CM

## 2012-05-18 DIAGNOSIS — S42002A Fracture of unspecified part of left clavicle, initial encounter for closed fracture: Secondary | ICD-10-CM

## 2012-05-18 DIAGNOSIS — S42009A Fracture of unspecified part of unspecified clavicle, initial encounter for closed fracture: Secondary | ICD-10-CM

## 2012-05-18 DIAGNOSIS — S52599A Other fractures of lower end of unspecified radius, initial encounter for closed fracture: Secondary | ICD-10-CM

## 2012-05-18 MED ORDER — HYDROCODONE-ACETAMINOPHEN 5-325 MG PO TABS: 2.0000 | ORAL_TABLET | ORAL | Status: AC | PRN

## 2012-05-18 MED ORDER — HYDROCODONE-ACETAMINOPHEN 5-325 MG PO TABS
ORAL_TABLET | ORAL | Status: AC
  Filled 2012-05-18: qty 2

## 2012-05-18 MED ORDER — HYDROCODONE-ACETAMINOPHEN 5-325 MG PO TABS
2.0000 | ORAL_TABLET | Freq: Once | ORAL | Status: AC
  Administered 2012-05-18: 2 via ORAL
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
## 2012-05-21 NOTE — ED Provider Notes (Signed)
Medical screening examination/treatment/procedure(s) were performed by resident physician or non-physician practitioner and as supervising physician I was immediately available for consultation/collaboration.   KINDL,JAMES DOUGLAS MD.    James D Kindl, MD 05/21/12 1827 

## 2012-05-22 ENCOUNTER — Telehealth: Payer: Self-pay | Admitting: Family Medicine

## 2012-05-22 NOTE — Telephone Encounter (Signed)
Wanted to let Dr Shannon Rush know that her last tetnus was 2010 Also is taking Calcium 500 mg w/ D  Is also having problems with pain in her back/mid section and is asking to speak with Dr Shannon Rush about this muscle cramp Also has a broken clavicle

## 2012-05-25 NOTE — Telephone Encounter (Signed)
Patient is calling to check on the status of hearing from Dr. Leveda Anna.

## 2012-05-26 NOTE — Telephone Encounter (Signed)
Attempted to call x 3.  LM.

## 2012-06-02 ENCOUNTER — Telehealth: Payer: Self-pay | Admitting: Family Medicine

## 2012-06-02 ENCOUNTER — Telehealth: Payer: Self-pay | Admitting: *Deleted

## 2012-06-02 NOTE — Telephone Encounter (Signed)
Spoke with pt- she states her RUQ is still painful, and had eval by Dr. Leveda Anna to r/o systemic cause which he did.  Pt taking tramadol and ibuprofen for pain without significant relief.  Also she broke her L wrist and clavicle x 2 weeks ago, wants to know if she should continue doing her back exercises.    Per Dr. Darrick Penna- scheduled pt for appt for eval of RUQ with him on thurs afternoon. Also advised her not to do back exercises now 2/2 broken wrist and clavicle.

## 2012-06-02 NOTE — Telephone Encounter (Signed)
Called and she is not expected back until 5:30 PM.  Will call again tomorrow morning.

## 2012-06-02 NOTE — Telephone Encounter (Signed)
Patient is calling wanting to speak to Dr. Leveda Anna about the pain she is having.  The pain medication that was prescribed to help, isn't helping.  She has been prescribed Tramadol.

## 2012-06-02 NOTE — Telephone Encounter (Signed)
Message copied by Mora Bellman on Tue Jun 02, 2012  1:52 PM ------      Message from: Merleen Nicely      Created: Tue Jun 02, 2012  8:52 AM      Regarding: pain      Contact: [phone]       Patient is wanting to ask a question regarding pain she has been having and whether or not she should continue to do the exercises she was given.

## 2012-06-02 NOTE — Telephone Encounter (Signed)
Called and spoke with pt and she informed me that the tramadol is not helping with the pain and is asking if she can get something else. Will forward to Dr. Leveda Anna.Loralee Pacas [place]

## 2012-06-03 NOTE — Telephone Encounter (Signed)
Called and discussed.  She is recovering nicely from the wrist, clavicle fracture, but still having significant abd. Pain that worsens throughout the day.  Two questions are 1. What can we do to get better pain relief and 2. Do we need to revisit the etiology of her pain.  She is aware that she will need a visit with me if she does not get good answers from Dr. Darrick Penna.

## 2012-06-04 ENCOUNTER — Ambulatory Visit (INDEPENDENT_AMBULATORY_CARE_PROVIDER_SITE_OTHER): Payer: Medicare Other | Admitting: Sports Medicine

## 2012-06-04 VITALS — BP 120/60 | Ht 68.0 in | Wt 125.0 lb

## 2012-06-04 DIAGNOSIS — R1011 Right upper quadrant pain: Secondary | ICD-10-CM

## 2012-06-04 DIAGNOSIS — M546 Pain in thoracic spine: Secondary | ICD-10-CM

## 2012-06-04 DIAGNOSIS — R52 Pain, unspecified: Secondary | ICD-10-CM

## 2012-06-04 NOTE — Assessment & Plan Note (Signed)
Pain today does not seem to be strictly abdominal  This seemed to be more in a radicular pattern from her thoracic spine  No organomegaly noted but she certainly has a pulsatile abdominal aorta  Another concern is that we have a cluster of abnormal lab tests-a macrocytic anemia that is long-standing, high total protein, low sodium and no clear medical diagnosis to link these

## 2012-06-04 NOTE — Progress Notes (Signed)
Subjective:   1. Back/abdominal pain in bandlike fashion follow up-patient states since October she has had pain in a band like distribution from her back around to her abdomen  within range of T7-T9 dermatomal distribution. She denies a history of rash or injury. Pain worse with side bending or twisting.  Patient states evaluated for this pain at office before and given exercises which included lawnmowers, robbery, and chest flies. She had been diligent in these without relief. She also tired ibuprofen 800 mg TID as well as Tramadol for pain. She couldn't tolerate Tramadol due to GI side effects and states gave minimal relief.    She had a recent injury within 2 weeks where she broke her wrist and clavicle. She was given Vicodin for this injury and states she could not tolerate it due to constipation and nausea/vomiting. Patient is very discouraged due to persistent pain and states she has been down as she cannot find a comfortable position. Pain tends to increase throughout the day.   She also notes some changes in sensation in her right outer thigh compared to her left. ROS also reveals 8 lbs weight loss within 2 months. Patient gets yearly mammograms, is up to date on colonoscopy, and denies bright red blood per rectum or melena.     ROS--See HPI  Past Medical History-kyphosis, osteoporosis (previously on Actonel and fosamax with minimal improvement, now on Forteo under Dr. Ernestina Penna and reports improved DEXA scans), compression fractures at T6, T8, T12, sternal.  Reviewed problem list.  Medications- reviewed and updated Chief complaint-noted  Objective: BP 120/60  Ht 5\' 8"  (1.727 m)  Wt 125 lb (56.7 kg)  BMI 19.01 kg/m2 Gen: NAD MSK: Figure of 8 brace on the upper chest Wrist splint the left wrist  Back - Normal skin, Spine with severe kyphosis  and compensatory lordosis.  No tenderness to vertebral process palpation.  Paraspinous muscles are not tender and without spasm with exception  of some pain approximately at level T8 with discomfort in band like distribution around through ribs and abdomen.   Range of motion is full at neck and lumbar sacral regions Abdomen: thin but patient with pulsatile aorta (does not feel widened)  Left rib cage from ribs T7-T9 are more notable in the right and are painful with compression   Assessment/Plan: See problem oriented charted

## 2012-06-04 NOTE — Assessment & Plan Note (Addendum)
Pain continues to be concerning for nerve impingement in thoracic area. Patient with history of multiple compression fractures and pain could be related to possible new compression fractures. Will obtain thoracic x-rays at this time.  Have encouraged patient to try aleve for pain instead of ibuprofen due to lowest CV risk of NSAIDs.  We may want to try gabapentin with her again as this did help her sciatica  Microcytic anemia and weight loss concerning for possible anemia of chronic disease and possible malignancy (normal b12 and MMA approximately 1 year ago). Will consider abdominal ultrasound, lipase (chronic pancreatitis though no history). Have encouraged patient to follow up with family medicine lab to obtain SPEP as previously ordered by Dr. Leveda Anna for suspicion of multiple myeloma. Could also consider ESR if other testing unrevealing.   Given abdominal pain and pulsatile aorta, hope that radiology will comment on aorta during thoracic films. May need to consider abdominal ultrasound to rule out aortic aneurysm. Do think thoracic nerve compression most likely cause presently.

## 2012-06-05 ENCOUNTER — Ambulatory Visit
Admission: RE | Admit: 2012-06-05 | Discharge: 2012-06-05 | Disposition: A | Discharge status: Home / Self Care | Payer: Medicare Other | Source: Ambulatory Visit | Attending: Sports Medicine | Admitting: Sports Medicine

## 2012-06-05 ENCOUNTER — Other Ambulatory Visit: Payer: Self-pay | Admitting: Family Medicine

## 2012-06-05 ENCOUNTER — Other Ambulatory Visit: Payer: Medicare Other

## 2012-06-05 DIAGNOSIS — M546 Pain in thoracic spine: Secondary | ICD-10-CM

## 2012-06-05 NOTE — Progress Notes (Signed)
SPEP DONE TODAY Shannon Rush

## 2012-06-09 LAB — PROTEIN ELECTROPHORESIS, SERUM, WITH REFLEX
Albumin ELP: 39.1 % — ABNORMAL LOW (ref 55.8–66.1)
Alpha-1-Globulin: 5.3 % — ABNORMAL HIGH (ref 2.9–4.9)
Alpha-2-Globulin: 8.8 % (ref 7.1–11.8)
Beta 2: 3.1 % — ABNORMAL LOW (ref 3.2–6.5)
Gamma Globulin: 38.8 % — ABNORMAL HIGH (ref 11.1–18.8)
M-Spike, %: 2.6 g/dL
Total Protein, Serum Electrophoresis: 7.1 g/dL (ref 6.0–8.3)

## 2012-06-09 LAB — IFE INTERPRETATION

## 2012-06-09 LAB — IGG, IGA, IGM
IgA: 35 mg/dL — ABNORMAL LOW (ref 69–380)
IgG (Immunoglobin G), Serum: 4040 mg/dL — ABNORMAL HIGH (ref 690–1700)
IgM, Serum: 34 mg/dL — ABNORMAL LOW (ref 52–322)

## 2012-06-10 ENCOUNTER — Ambulatory Visit (INDEPENDENT_AMBULATORY_CARE_PROVIDER_SITE_OTHER): Payer: Medicare Other | Admitting: Family Medicine

## 2012-06-10 ENCOUNTER — Encounter: Payer: Self-pay | Admitting: Family Medicine

## 2012-06-10 VITALS — BP 158/84 | HR 81 | Temp 97.5°F | Ht 68.0 in | Wt 128.9 lb

## 2012-06-10 DIAGNOSIS — R1011 Right upper quadrant pain: Secondary | ICD-10-CM

## 2012-06-10 DIAGNOSIS — M546 Pain in thoracic spine: Secondary | ICD-10-CM

## 2012-06-10 DIAGNOSIS — D472 Monoclonal gammopathy: Secondary | ICD-10-CM

## 2012-06-10 DIAGNOSIS — R52 Pain, unspecified: Secondary | ICD-10-CM

## 2012-06-10 MED ORDER — TRAMADOL HCL 50 MG PO TABS: 50.0000 mg | ORAL_TABLET | Freq: Three times a day (TID) | ORAL | Status: DC | PRN

## 2012-06-10 NOTE — Patient Instructions (Addendum)
My nurse will work with you on the hematology referral. Take the tramadol. Remember, I do not yet have a firm diagnosis, only suspicions.   Keep on your current medicines.  Try to enjoy your Thanksgiving.

## 2012-06-11 NOTE — Progress Notes (Signed)
  Subjective:    Patient ID: Shannon Rush, female    DOB: 04-12-1941, 71 y.o.   MRN: [id number]  HPI Dr. Darrick Penna had previously reviewed T spine films.  She is for FU of abnormal labs.  Her SPEP reveals monoclonal gammagolbin spike.  Explained differential diagnosis and that this may represent multiple myeloma (of course, explained in lay terms.)   She and her husband are aware, know importance of follow up, know that I have not confirmed any diagnosis and that she needs specialty care help at this point.  She is also aware that her compression fractures may be more that simple osteoporotic and that there may be a pathologic fx element.     Review of Systems     Objective:   Physical Exam Abd benign.   Mood good.  Seemed to accept and understand the possible diagnosis.       Assessment & Plan:

## 2012-06-11 NOTE — Assessment & Plan Note (Signed)
Stable on meds.  She is finding a pain med routine that works for her.

## 2012-06-11 NOTE — Assessment & Plan Note (Signed)
Heme/onc referral for further WU and treatment.

## 2012-06-16 ENCOUNTER — Telehealth: Payer: Self-pay | Admitting: *Deleted

## 2012-06-16 NOTE — Telephone Encounter (Signed)
Received vm call from pt stating that she called yest & left message but hasn't heard from anyone.  She reports that Dr. Leveda Anna referred her to Dr Cyndie Chime.  Returned call & informed pt that message would be left with our new pt coordinator & if referral has been made, they will be in touch with her soon.

## 2012-06-17 ENCOUNTER — Ambulatory Visit: Payer: Medicare Other | Admitting: Family Medicine

## 2012-06-17 ENCOUNTER — Telehealth: Payer: Self-pay | Admitting: Family Medicine

## 2012-06-17 NOTE — Telephone Encounter (Signed)
Patient is calling about the referral to the Hematologist, [place], they have note gotten the referral yet.  Also, she want to be sure that she understood correctly that taking up to 4 Tramadol at one time, is ok.

## 2012-06-18 MED ORDER — HYDROCODONE-ACETAMINOPHEN 5-500 MG PO TABS: 1.0000 | ORAL_TABLET | Freq: Four times a day (QID) | ORAL | Status: DC | PRN

## 2012-06-18 MED ORDER — POLYETHYLENE GLYCOL 3350 17 GM/SCOOP PO POWD: 17.0000 g | Freq: Every day | ORAL | Status: DC

## 2012-06-18 NOTE — Telephone Encounter (Signed)
Called and discussed.  Upped her to the next step on the pain ladder, hydrocodone.

## 2012-06-18 NOTE — Telephone Encounter (Signed)
Spoke with patient and informed her to do as the directions state about the Tramadol. patient states that it is not doing the job and she still is in horrible pain so she is taking 2 at a time and wants to go higher. IReferral was put in the workqueue the hematologist will contact the patient about the appointment. Explained to patient in 12/3 phone note.

## 2012-06-23 NOTE — Telephone Encounter (Signed)
Pt still hasn't heard from Dr Cyndie Chime and wants to know if we can call to see when they will call her.

## 2012-06-24 ENCOUNTER — Other Ambulatory Visit: Payer: Self-pay | Admitting: Oncology

## 2012-06-24 DIAGNOSIS — D472 Monoclonal gammopathy: Secondary | ICD-10-CM

## 2012-06-24 NOTE — Telephone Encounter (Signed)
Dr Patsy Lager office cannot see her until 1/8 and she thinks it needs to be much sooner.  Their office said they would call Dr Leveda Anna to determine if they need to bring her in as an emergency appt.  She is concerned about having bone cancer and wants to get in asap.

## 2012-06-25 ENCOUNTER — Telehealth: Payer: Self-pay | Admitting: Oncology

## 2012-06-25 MED ORDER — GABAPENTIN 100 MG PO CAPS: 100.0000 mg | ORAL_CAPSULE | Freq: Three times a day (TID) | ORAL | Status: DC

## 2012-06-25 NOTE — Telephone Encounter (Signed)
S/W pt in re NP appt 12/19 @ 3 w/Dr. Gaylyn Rong Referring Dr. Leveda Anna Dx- R/O Multi Myeloma Welcome packet mailed.

## 2012-06-25 NOTE — Addendum Note (Signed)
Addended by: Tivis Ringer on: 06/25/2012 02:50 PM   Modules accepted: Orders

## 2012-06-25 NOTE — Telephone Encounter (Signed)
Called Dr. Cyndie Chime first.  He is unable to see her before 1/8 - but will see if one of his partners can see earlier.  He will contact patient.  Called patient and apologized for the delay.  Informed someone from the cancer center should call her soon about possibly moving up appointment with another doc.  She agrees, would like the appointment soon and is frustrated by the delay.  Also discussed continued pain.  After discussion, we agreed that she may have an element of nerve pain and will try gabapentin, which she has used in the past.

## 2012-06-30 ENCOUNTER — Telehealth: Payer: Self-pay | Admitting: Oncology

## 2012-06-30 ENCOUNTER — Ambulatory Visit (HOSPITAL_COMMUNITY)
Admission: RE | Admit: 2012-06-30 | Discharge: 2012-06-30 | Disposition: A | Discharge status: Home / Self Care | Payer: Medicare Other | Source: Ambulatory Visit | Attending: Oncology | Admitting: Oncology

## 2012-06-30 DIAGNOSIS — M899 Disorder of bone, unspecified: Secondary | ICD-10-CM | POA: Insufficient documentation

## 2012-06-30 DIAGNOSIS — J9819 Other pulmonary collapse: Secondary | ICD-10-CM | POA: Insufficient documentation

## 2012-06-30 DIAGNOSIS — M549 Dorsalgia, unspecified: Secondary | ICD-10-CM | POA: Insufficient documentation

## 2012-06-30 DIAGNOSIS — X58XXXA Exposure to other specified factors, initial encounter: Secondary | ICD-10-CM | POA: Insufficient documentation

## 2012-06-30 DIAGNOSIS — M949 Disorder of cartilage, unspecified: Secondary | ICD-10-CM | POA: Insufficient documentation

## 2012-06-30 DIAGNOSIS — D472 Monoclonal gammopathy: Secondary | ICD-10-CM | POA: Insufficient documentation

## 2012-06-30 DIAGNOSIS — M47817 Spondylosis without myelopathy or radiculopathy, lumbosacral region: Secondary | ICD-10-CM | POA: Insufficient documentation

## 2012-06-30 DIAGNOSIS — S22009A Unspecified fracture of unspecified thoracic vertebra, initial encounter for closed fracture: Secondary | ICD-10-CM | POA: Insufficient documentation

## 2012-06-30 DIAGNOSIS — J90 Pleural effusion, not elsewhere classified: Secondary | ICD-10-CM | POA: Insufficient documentation

## 2012-06-30 DIAGNOSIS — M25519 Pain in unspecified shoulder: Secondary | ICD-10-CM | POA: Insufficient documentation

## 2012-06-30 NOTE — Telephone Encounter (Signed)
C/D 06/30/12 for appt. 07/02/12

## 2012-07-01 ENCOUNTER — Encounter: Payer: Self-pay | Admitting: Oncology

## 2012-07-01 DIAGNOSIS — C9000 Multiple myeloma not having achieved remission: Secondary | ICD-10-CM

## 2012-07-01 HISTORY — DX: Multiple myeloma not having achieved remission: C90.00

## 2012-07-01 NOTE — Patient Instructions (Addendum)
1.  Diagnosis:  Multiple myeloma. 2.  Causes:  Unknown.  3.  What is it:  Most of the time, a slow growing cancer of the plasma cell in the bone marrow.  Untreated, it can cause:  Hypercalcemia, renal failure, anemia, and bone fracture.  Having one of these 4 criteria indicates need for treatment.   You have anemia and bone lesions.  4.  Stage:  Not classily staged like solid cancer; but rather according to bone marrow cytogenetics and whether there are indication to treat.  5.  Treatment:    *  Oral chemo only:  Revlimid (days 1-21); Dexamethasone (once a week); every 4 week-cycle.  *  Oral + SQ chemo:  Revlimid (days 1-21); Velcade SQ once a week (3 weeks on, 1 week off);  Dexamethasone (once a week); every 4 week-cycle.  *  Clinical trial using all oral chemo (Revlimid + Dexamethasone + oral proteosome inhibitor MLN 9708 instead of Velcade).   *  In patients who are reasonably in good health, we may consider autologous bone marrow transplant to improve chance of cure.   6.  Potential side effects of these chemo:  Low blood count, bleeding, infection, neuropathy, skin rash, blood clot.   7.  Chance of response:  Upward to 70-80% depending on risk stratification.    8.  How to follow response:  M-spike (on SPEP) and serum light chain should decrease with chemo.    9.  Work up:  * Bone marrow biopsy soon.  First available:  Tomorrow Friday 07/03/2012 at 8am.      * Follow up after trip to [facility] to start therapy.  * Talk with Corrie Dandy, research nurse about potential particiation in a trial.

## 2012-07-02 ENCOUNTER — Encounter: Payer: Self-pay | Admitting: Oncology

## 2012-07-02 ENCOUNTER — Encounter: Payer: Self-pay | Admitting: *Deleted

## 2012-07-02 ENCOUNTER — Other Ambulatory Visit (HOSPITAL_BASED_OUTPATIENT_CLINIC_OR_DEPARTMENT_OTHER): Payer: Medicare Other | Admitting: Lab

## 2012-07-02 ENCOUNTER — Ambulatory Visit: Payer: Medicare Other

## 2012-07-02 ENCOUNTER — Ambulatory Visit (HOSPITAL_BASED_OUTPATIENT_CLINIC_OR_DEPARTMENT_OTHER): Payer: Medicare Other | Admitting: Oncology

## 2012-07-02 VITALS — BP 145/82 | HR 102 | Temp 97.5°F | Resp 18 | Ht 68.0 in | Wt 126.2 lb

## 2012-07-02 DIAGNOSIS — C9000 Multiple myeloma not having achieved remission: Secondary | ICD-10-CM

## 2012-07-02 DIAGNOSIS — R52 Pain, unspecified: Secondary | ICD-10-CM

## 2012-07-02 DIAGNOSIS — M549 Dorsalgia, unspecified: Secondary | ICD-10-CM

## 2012-07-02 DIAGNOSIS — D539 Nutritional anemia, unspecified: Secondary | ICD-10-CM

## 2012-07-02 DIAGNOSIS — D472 Monoclonal gammopathy: Secondary | ICD-10-CM

## 2012-07-02 DIAGNOSIS — M949 Disorder of cartilage, unspecified: Secondary | ICD-10-CM

## 2012-07-02 DIAGNOSIS — E859 Amyloidosis, unspecified: Secondary | ICD-10-CM

## 2012-07-02 DIAGNOSIS — M899 Disorder of bone, unspecified: Secondary | ICD-10-CM

## 2012-07-02 DIAGNOSIS — D649 Anemia, unspecified: Secondary | ICD-10-CM

## 2012-07-02 LAB — CBC WITH DIFFERENTIAL/PLATELET
BASO%: 0.9 % (ref 0.0–2.0)
EOS%: 1.6 % (ref 0.0–7.0)
HCT: 26.7 % — ABNORMAL LOW (ref 34.8–46.6)
MCH: 35.2 pg — ABNORMAL HIGH (ref 25.1–34.0)
MCHC: 34.7 g/dL (ref 31.5–36.0)
MONO#: 0.5 10*3/uL (ref 0.1–0.9)
RDW: 15.3 % — ABNORMAL HIGH (ref 11.2–14.5)
WBC: 5.4 10*3/uL (ref 3.9–10.3)
lymph#: 0.8 10*3/uL — ABNORMAL LOW (ref 0.9–3.3)

## 2012-07-02 LAB — IRON AND TIBC
%SAT: 24 % (ref 20–55)
Iron: 78 ug/dL (ref 42–145)
TIBC: 319 ug/dL (ref 250–470)
UIBC: 241 ug/dL (ref 125–400)

## 2012-07-02 LAB — COMPREHENSIVE METABOLIC PANEL (CC13)
AST: 25 U/L (ref 5–34)
BUN: 25 mg/dL (ref 7.0–26.0)
Calcium: 8.9 mg/dL (ref 8.4–10.4)
Chloride: 102 mEq/L (ref 98–107)
Creatinine: 0.8 mg/dL (ref 0.6–1.1)
Total Bilirubin: 0.34 mg/dL (ref 0.20–1.20)

## 2012-07-02 LAB — VITAMIN B12: Vitamin B-12: 497 pg/mL (ref 211–911)

## 2012-07-02 LAB — FERRITIN: Ferritin: 166 ng/mL (ref 10–291)
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
## 2012-07-02 NOTE — Progress Notes (Signed)
Checked in new pt with no financial concerns. °

## 2012-07-03 ENCOUNTER — Telehealth: Payer: Self-pay | Admitting: *Deleted

## 2012-07-03 ENCOUNTER — Ambulatory Visit (HOSPITAL_BASED_OUTPATIENT_CLINIC_OR_DEPARTMENT_OTHER): Payer: Medicare Other | Admitting: Oncology

## 2012-07-03 ENCOUNTER — Telehealth: Payer: Self-pay | Admitting: Oncology

## 2012-07-03 ENCOUNTER — Encounter: Payer: Self-pay | Admitting: *Deleted

## 2012-07-03 ENCOUNTER — Other Ambulatory Visit: Payer: Self-pay | Admitting: *Deleted

## 2012-07-03 ENCOUNTER — Other Ambulatory Visit (HOSPITAL_COMMUNITY)
Admission: RE | Admit: 2012-07-03 | Discharge: 2012-07-03 | Disposition: A | Discharge status: Home / Self Care | Payer: Medicare Other | Source: Ambulatory Visit | Attending: Oncology | Admitting: Oncology

## 2012-07-03 VITALS — BP 147/63 | HR 88 | Temp 97.0°F | Resp 18

## 2012-07-03 DIAGNOSIS — C9000 Multiple myeloma not having achieved remission: Secondary | ICD-10-CM

## 2012-07-03 MED ORDER — MORPHINE SULFATE ER 15 MG PO TBCR: 15.0000 mg | EXTENDED_RELEASE_TABLET | Freq: Two times a day (BID) | ORAL | Status: DC

## 2012-07-03 NOTE — Progress Notes (Signed)
Please see bone marrow biopsy note dated 07/03/12.

## 2012-07-03 NOTE — Telephone Encounter (Signed)
Gave pt appt for December 2013 lab and MD, gave precert to Lilyan Punt for MRI, ignore chemo orders from last POF dated 1219 per Dr. Gaylyn Rong

## 2012-07-03 NOTE — Procedures (Signed)
   [facility]  Telephone:(336) [phone] Fax:(336) [phone]   BONE MARROW BIOPSY AND ASPIRATION   INDICATION:  Suspected myeloma.    Procedure: After obtained from consent, Shannon Rush was placed in the prone position. Time out was performed verifying correct patient and procedure. The skin overlying the left posterior crest was prepped with Betadine and draped in the usual sterile fashion. The skin and periosteum were infiltrated with 10 mL of 2% lidocaine. A small puncture wound was made with #11 scalpel blade.  Bone marrow aspirate was obtained on the first pass of the aspiration needle.  Two separate core biopsies were obtained through the same incision.   The aspirate was sent for routine histology, flow cytometry, myeloma FISH panel, and cytogenetics.  Core biopsies  Were sent for routine histology.   Shannon Rush tolerated procedure well with minimal  blood loss and without immediate complication.   A sterile dressing was applied.   Shannon Rush M.D. 07/03/2012

## 2012-07-03 NOTE — Patient Instructions (Signed)
[facility] Discharge Instructions for Post Bone Marrow Procedure  Today you had a bone marrow biopsy and aspirate of Left hip.  Please keep the pressure dressing in place for at least 24 hours.  Have someone check your dressing periodically for bleeding.  If needed you can reapply a pressure dressing to the site.  Take pain medication, long acting morphine and Hydrocodone for breakthrough pain as directed.  IF BLEEDING REOCCURS THAT SHOULD BE REPORTED IMMEDIATELY. Call the Cancer Center at [phone] if during business hours. Or report to the Emergency Room.   I have been informed and understand all the instructions given to me. I know to contact the clinic, my physician, or go to the Emergency Department if any problems should occur. I do not have any questions at this time, but understand that I may call the clinic during office hours at [phone]  should I have any questions or need assistance in obtaining follow up care.    __________________________________________  _____________  __________ Signature of Patient or Authorized Representative            Date                   Time    __________________________________________ Nurse's Signature

## 2012-07-03 NOTE — Telephone Encounter (Signed)
per Dr. Gaylyn Rong disregard 12.19.13 pof

## 2012-07-03 NOTE — Telephone Encounter (Signed)
Per staff and POF I have scheduled appts. JMW

## 2012-07-03 NOTE — Progress Notes (Signed)
Called Shannon Rush with schedule for 07/13/12. She will have research labs drawn and processed beginning at 1:00. She will have an EKG and complete questionnaires after her lab appointment and before her appointment with Dr. Gaylyn Rong.

## 2012-07-06 LAB — KAPPA/LAMBDA LIGHT CHAINS
Kappa free light chain: 34.4 mg/dL — ABNORMAL HIGH (ref 0.33–1.94)
Kappa:Lambda Ratio: 34.75 — ABNORMAL HIGH (ref 0.26–1.65)
Lambda Free Lght Chn: 0.99 mg/dL (ref 0.57–2.63)

## 2012-07-06 LAB — IMMUNOFIXATION ELECTROPHORESIS
IgA: 24 mg/dL — ABNORMAL LOW (ref 69–380)
IgG (Immunoglobin G), Serum: 3880 mg/dL — ABNORMAL HIGH (ref 690–1700)
IgM, Serum: 25 mg/dL — ABNORMAL LOW (ref 52–322)
Total Protein, Serum Electrophoresis: 8.1 g/dL (ref 6.0–8.3)

## 2012-07-09 ENCOUNTER — Other Ambulatory Visit: Payer: Self-pay | Admitting: *Deleted

## 2012-07-09 DIAGNOSIS — C9000 Multiple myeloma not having achieved remission: Secondary | ICD-10-CM

## 2012-07-10 ENCOUNTER — Ambulatory Visit: Payer: Medicare Other | Admitting: Oncology

## 2012-07-10 ENCOUNTER — Other Ambulatory Visit: Payer: Medicare Other

## 2012-07-12 NOTE — Patient Instructions (Addendum)
1.  Diagnosis:  Multiple myeloma.  Bone marrow biopsy showed 49% plasma cell. 2.  Treatment:  Clinical trial Revlimid/Dexamethasone/ and an oral experimental proteosome inhibitor agent (in place of Velcade).  3.  Potential side effects of this combination chemoregimen include but not limited to fatigue, mouth sore, low blood count, bleeding, blood clot, infection, diarrhea/constipation, abnormal electrolytes, neuropathy.

## 2012-07-13 ENCOUNTER — Encounter: Payer: Self-pay | Admitting: Radiation Oncology

## 2012-07-13 ENCOUNTER — Ambulatory Visit
Admission: RE | Admit: 2012-07-13 | Discharge: 2012-07-13 | Disposition: A | Discharge status: Home / Self Care | Payer: Medicare Other | Source: Ambulatory Visit | Attending: Radiation Oncology | Admitting: Radiation Oncology

## 2012-07-13 ENCOUNTER — Other Ambulatory Visit: Payer: Self-pay | Admitting: Oncology

## 2012-07-13 ENCOUNTER — Ambulatory Visit: Payer: Medicare Other

## 2012-07-13 ENCOUNTER — Other Ambulatory Visit (HOSPITAL_BASED_OUTPATIENT_CLINIC_OR_DEPARTMENT_OTHER): Payer: Medicare Other | Admitting: Lab

## 2012-07-13 ENCOUNTER — Other Ambulatory Visit: Payer: Self-pay | Admitting: *Deleted

## 2012-07-13 ENCOUNTER — Ambulatory Visit (HOSPITAL_COMMUNITY)
Admission: RE | Admit: 2012-07-13 | Discharge: 2012-07-13 | Disposition: A | Discharge status: Home / Self Care | Payer: Medicare Other | Source: Ambulatory Visit | Attending: Oncology | Admitting: Oncology

## 2012-07-13 ENCOUNTER — Other Ambulatory Visit: Payer: Self-pay

## 2012-07-13 ENCOUNTER — Ambulatory Visit (HOSPITAL_BASED_OUTPATIENT_CLINIC_OR_DEPARTMENT_OTHER): Payer: Medicare Other | Admitting: Oncology

## 2012-07-13 ENCOUNTER — Ambulatory Visit: Payer: Medicare Other | Admitting: Radiation Oncology

## 2012-07-13 VITALS — BP 135/78 | HR 85 | Temp 97.0°F | Resp 20 | Ht 68.0 in | Wt 127.9 lb

## 2012-07-13 VITALS — BP 135/78 | HR 85 | Temp 97.0°F | Resp 18 | Ht 68.0 in | Wt 127.9 lb

## 2012-07-13 DIAGNOSIS — G959 Disease of spinal cord, unspecified: Secondary | ICD-10-CM | POA: Insufficient documentation

## 2012-07-13 DIAGNOSIS — M25519 Pain in unspecified shoulder: Secondary | ICD-10-CM | POA: Insufficient documentation

## 2012-07-13 DIAGNOSIS — D539 Nutritional anemia, unspecified: Secondary | ICD-10-CM

## 2012-07-13 DIAGNOSIS — C9000 Multiple myeloma not having achieved remission: Secondary | ICD-10-CM

## 2012-07-13 DIAGNOSIS — G9520 Unspecified cord compression: Secondary | ICD-10-CM

## 2012-07-13 DIAGNOSIS — M549 Dorsalgia, unspecified: Secondary | ICD-10-CM | POA: Insufficient documentation

## 2012-07-13 DIAGNOSIS — D472 Monoclonal gammopathy: Secondary | ICD-10-CM

## 2012-07-13 DIAGNOSIS — R030 Elevated blood-pressure reading, without diagnosis of hypertension: Secondary | ICD-10-CM | POA: Insufficient documentation

## 2012-07-13 DIAGNOSIS — R52 Pain, unspecified: Secondary | ICD-10-CM

## 2012-07-13 DIAGNOSIS — M40204 Unspecified kyphosis, thoracic region: Secondary | ICD-10-CM

## 2012-07-13 DIAGNOSIS — M546 Pain in thoracic spine: Secondary | ICD-10-CM

## 2012-07-13 DIAGNOSIS — R110 Nausea: Secondary | ICD-10-CM | POA: Insufficient documentation

## 2012-07-13 DIAGNOSIS — Z510 Encounter for antineoplastic radiation therapy: Secondary | ICD-10-CM | POA: Insufficient documentation

## 2012-07-13 HISTORY — DX: Unspecified cord compression: G95.20

## 2012-07-13 HISTORY — DX: Unspecified kyphosis, thoracic region: M40.204

## 2012-07-13 LAB — RESEARCH LABS

## 2012-07-13 MED ORDER — IOHEXOL 300 MG/ML  SOLN
100.0000 mL | Freq: Once | INTRAMUSCULAR | Status: AC | PRN
  Administered 2012-07-13: 100 mL via INTRAVENOUS

## 2012-07-13 MED ORDER — GADOBENATE DIMEGLUMINE 529 MG/ML IV SOLN
11.0000 mL | Freq: Once | INTRAVENOUS | Status: AC | PRN
  Administered 2012-07-13: 11 mL via INTRAVENOUS

## 2012-07-13 MED ORDER — DEXAMETHASONE 4 MG PO TABS: 4.0000 mg | ORAL_TABLET | Freq: Four times a day (QID) | ORAL | Status: DC

## 2012-07-13 NOTE — Progress Notes (Signed)
Patient presents to the clinic today accompanied by her husband and wife for new consult with Dr. Kathrynn Running to discuss the role of radiation therapy in the treatment of a cord compression. Patient is alert and oriented to person, place, and time. No distress noted. Slow unsteady gait noted. Patient require one person assist. Patient reports that her legs feel very unsteady and are numb from the hips to the ankles. Patient reports that occasionally her left knee turn to jello. Patient denies numbness in her feet or hands. Pleasant affect noted. Patient reports continual left and right flank pain 3 on a scale of 0-10 which increases with movement. Patient reports taking morphine 15 mg every 12 hours. Patient denies taking decadron presently. However, patient reports that Dr. Gaylyn Rong has prescribed decadron qid but, they are unsure of the dose. Patient reports persistent nausea. Patient reports taking zofran prn but, isn't "sure it helps." Encourage patient to take antiemetic otc instead of prn. Patient reports that she will try. Patient reports constipation. Patient denies having a bowel movement in two days. Encouraged patient to take colace bid instead of once. Also, encouraged patient to increase fluid intake. Reported all findings to Dr. Kathrynn Running.   NKDA No hx of radiation therapy Denies having a pacemaker

## 2012-07-13 NOTE — Progress Notes (Signed)
  Radiation Oncology         [phone] ________________________________  Name: Shannon Rush MRN: [id number]  Date: 07/13/2012  DOB: 1941/06/21  SIMULATION AND TREATMENT PLANNING NOTE  DIAGNOSIS:  71 yo woman with spinal cord compression from multiple myeloma in the thoracic spine  NARRATIVE:  The patient was brought to the CT Simulation planning suite.  Identity was confirmed.  All relevant records and images related to the planned course of therapy were reviewed.  The patient freely provided informed written consent to proceed with treatment after reviewing the details related to the planned course of therapy. The consent form was witnessed and verified by the simulation staff.  Then, the patient was set-up in a stable reproducible  supine position for radiation therapy.  CT images were obtained.  Surface markings were placed.  The CT images were loaded into the planning software.  Then the target and avoidance structures were contoured.  Treatment planning then occurred.  The radiation prescription was entered and confirmed.  A total of 2 complex treatment devices were fabricated in the form of anterior and posterior multileaf collimators. I have requested : Isodose Plan.   PLAN:  The patient will receive 20 Gy in 10 fractions.  She'll start emergently today.  ________________________________  Artist Pais. Kathrynn Running, M.D.

## 2012-07-13 NOTE — Progress Notes (Signed)
See progress note under physician encounter. 

## 2012-07-14 ENCOUNTER — Telehealth: Payer: Self-pay | Admitting: Oncology

## 2012-07-14 ENCOUNTER — Ambulatory Visit
Admission: RE | Admit: 2012-07-14 | Discharge: 2012-07-14 | Disposition: A | Discharge status: Home / Self Care | Payer: Medicare Other | Source: Ambulatory Visit | Attending: Radiation Oncology | Admitting: Radiation Oncology

## 2012-07-14 ENCOUNTER — Other Ambulatory Visit: Payer: Self-pay | Admitting: Oncology

## 2012-07-14 DIAGNOSIS — C9000 Multiple myeloma not having achieved remission: Secondary | ICD-10-CM

## 2012-07-14 DIAGNOSIS — G9520 Unspecified cord compression: Secondary | ICD-10-CM

## 2012-07-14 LAB — BETA 2 MICROGLOBULIN, SERUM: Beta-2 Microglobulin: 2.14 mg/L — ABNORMAL HIGH (ref 1.01–1.73)

## 2012-07-14 NOTE — Telephone Encounter (Signed)
lvm for pt [phone].Marland KitchenAlso faxed referral to their office [phone]

## 2012-07-14 NOTE — Progress Notes (Signed)
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 neurologically intact.  PLAN:Today, I talked to the patient and family about the findings and work-up thus far.  We discussed the natural history of disease and general treatment, highlighting the role or radiotherapy in the management.  We discussed the available radiation techniques, and focused on the details of logistics and delivery.  We reviewed the anticipated acute and late sequelae associated with radiation in this setting.  The patient was encouraged to ask questions that I answered to the best of my ability.  I filled out a patient counseling form during our discussion including treatment diagrams.  We retained a copy for our records.  The patient would like to proceed with radiation and will be scheduled for CT simulation.  I spent 40 minutes minutes face to face with the patient and more than 50% of that time was spent in counseling and/or coordination of care.    ------------------------------------------------  Artist Pais. Kathrynn Running, M.D.

## 2012-07-14 NOTE — Progress Notes (Signed)
[facility]  Telephone:([phone]) [phone] [phone]   OFFICE PROGRESS NOTE   Cc:  Sanjuana Letters, MD  DIAGNOSIS: multiple myeloma, presented with lytic bone lesions, anemia (Hgb 9.3). Presenting IgG was 4,040 mg/dL on 21/30/8657 (IgA 35; IgM 34); kappa free light chain 34.4 mg/dL, lambda 8.46, kappa:lambda ratio of 34.75; SPEP with M-spike of 2.60.  Bone marrow biopsy on 07/03/2012 showed 49% plasma cell.    PAST THERAPY:  Biopsy only.   CURRENT THERAPY:  Due to start palliative radiation therapy soon for cord compression.  She is on Dexamethasone 4mg  PO q6h.   INTERVAL HISTORY: Shannon Rush 71 y.o. female returns for regular follow up with her husband and daughter.  She had a good time in [place].  However, she needed to use a walker to steady her gait.  She feels that her bilateral legs are getting weaker and at time want to buckle.  However, she has not had any fall.  For the past week, she has been developing numbness and tingling of the bilateral lateral thighs and distal legs.  She has constipation but denied bowel/bladder incontinence. She still has mid back pain radiating like a band across her lower chest.   Patient denies fever, headache, visual changes, confusion, drenching night sweats, palpable lymph node swelling, mucositis, odynophagia, dysphagia, nausea vomiting, jaundice, chest pain, palpitation, shortness of breath, dyspnea on exertion, productive cough, gum bleeding, epistaxis, hematemesis, hemoptysis, abdominal pain, abdominal swelling, early satiety, melena, hematochezia, hematuria, skin rash, spontaneous bleeding, heat or cold intolerance, bowel bladder incontinence.    Past Medical History  Diagnosis Date  . Multiple myeloma 07/01/2012  . Unspecified deficiency anemia   . OSTEOPOROSIS 06/11/2010    Multiple compression fractures; and spontaneous fracture of sternum Qualifier: Diagnosis of  By: Humberto Seals NP, Darl Pikes     . Thoracic kyphosis  07/13/12    per MRI scan  . Cord compression 07/13/12    MRI- diffuse myeloma involvement of T-L spine    Past Surgical History  Procedure Date  . Appendectomy   . Tubal ligation   . Cesarean section     x2   . Hip surgery 2009    left  . Elbow surgery   . Colonoscopy 2007    neg with Dr. Ewing Schlein    Current Outpatient Prescriptions  Medication Sig Dispense Refill  . gabapentin (NEURONTIN) 100 MG capsule Take 300 mg by mouth daily. Patient has the med at home, has used in the past and will escalate the dose.      . morphine (MS CONTIN) 15 MG 12 hr tablet Take 1 tablet (15 mg total) by mouth 2 (two) times daily.  60 tablet  0  . polyethylene glycol powder (GLYCOLAX/MIRALAX) powder Take 17 g by mouth daily. May increase to twice daily as needed.  3350 g  3  . Teriparatide, Recombinant, (FORTEO [place]) Inject into the skin daily.        Marland Kitchen dexamethasone (DECADRON) 4 MG tablet Take 1 tablet (4 mg total) by mouth QID.  60 tablet  2  . docusate sodium (COLACE) 100 MG capsule Take 100 mg by mouth 2 (two) times daily.      Marland Kitchen HYDROcodone-acetaminophen (VICODIN) 5-500 MG per tablet Take 1 tablet by mouth every 6 (six) hours as needed for pain.  100 tablet  3  . IBUPROFEN PO Take 800 mg by mouth 3 (three) times daily as needed. Takes up to 12 tablets per day.      Marland Kitchen  ondansetron (ZOFRAN) 4 MG tablet Take 4 mg by mouth every 8 (eight) hours as needed.        ALLERGIES:   has no known allergies.  REVIEW OF SYSTEMS:  The rest of the 14-point review of system was negative.   Filed Vitals:   07/13/12 1417  BP: 135/78  Pulse: 85  Temp: 97 F (36.1 C)  Resp: 20   Wt Readings from Last 3 Encounters:  07/13/12 127 lb 14.4 oz (58.015 kg)  07/13/12 127 lb 14.4 oz (58.015 kg)  07/02/12 126 lb 3.2 oz (57.244 kg)   ECOG Performance status: 1-2  PHYSICAL EXAMINATION:   General:  thin appearing woman, in no acute distress.  Eyes:  no scleral icterus.  ENT:  There were no oropharyngeal lesions.  Neck  was without thyromegaly.  Lymphatics:  Negative cervical, supraclavicular or axillary adenopathy.  Respiratory: lungs were clear bilaterally without wheezing or crackles.  Cardiovascular:  Regular rate and rhythm, S1/S2, without murmur, rub or gallop.  There was no pedal edema.  GI:  abdomen was soft, flat, nontender, nondistended, without organomegaly.  Muscoloskeletal: mild tenderness to palpation of her mid spine.  Skin exam was without echymosis, petichae.  Neuro exam was nonfocal.  Patient was able to get on and off exam table with minor assistance.  Gait was measured.  Patient was alerted and oriented.  Attention was good.   Language was appropriate.  Mood was normal without depression.  Speech was not pressured.  Thought content was not tangential.     LABORATORY/RADIOLOGY DATA:  Lab Results  Component Value Date   WBC 5.4 07/02/2012   HGB 9.3* 07/02/2012   HCT 26.7* 07/02/2012   PLT 224 07/02/2012   GLUCOSE 93 07/02/2012   CHOL 207* 06/02/2009   TRIG 68 06/02/2009   HDL 73 06/02/2009   LDLCALC 120* 06/02/2009   ALKPHOS 85 07/02/2012   ALT 18 07/02/2012   AST 25 07/02/2012   NA 131* 07/02/2012   K 4.0 07/02/2012   CL 102 07/02/2012   CREATININE 0.67 07/13/2012   BUN 25.0 07/02/2012   CO2 27 07/02/2012    Ct Chest W Contrast  07/13/2012  *RADIOLOGY REPORT*  Clinical Data:  Multiple myeloma, evaluate for extramedullary disease.  Chest pain, weight loss, abdominal pain, nausea, constipation, prior appendectomy.  CT CHEST, ABDOMEN AND PELVIS WITH CONTRAST  Technique:  Multidetector CT imaging of the chest, abdomen and pelvis was performed following the standard protocol during bolus administration of intravenous contrast.  Contrast: OMNIPAQUE IOHEXOL 300 MG/ML  SOLN  Comparison:  MRI thoracic/lumbar spine dated 07/13/2012.  CTA chest dated 04/01/2011.  CT CHEST  Findings:  Biapical pleural parenchymal scarring.  Patchy opacity in the superior segment right lower lobe (series  5/image 15), possibly scarring and/or atelectasis.  Small right and trace left pleural effusions with associated lower lobe atelectasis.  Mild linear scarring in the right middle lobe.  No pneumothorax.  Visualized thyroid is unremarkable.  Mild cardiomegaly.  Coronary atherosclerosis.  No suspicious mediastinal, hilar, or axillary lymphadenopathy.  Multiple thoracic compression deformities.  Tumor most prominently involves the right posterior elements at T6 (series 2/image 20) and extends into the epidural space from T6-T9 (series 2/image 26). These findings are better evaluated on recent MRI thoracic spine.  Destructive lytic lesion in the left inferior scapula with associated pathologic fracture (sagittal image 98).  Expansile lytic lesions involving the left anterior third fourth ribs as well as the left lateral sixth rib. Left fourth  rib lesion measures 3.1 x 2.2 cm (series 2/image 33).  Multiple additional power rib deformities, many of which are likely chronic. Old displaced sternal fracture (sagittal image 53). Diffusely heterogeneous appearance of the visualized axial and appendicular skeleton.  IMPRESSION: Multiple destructive lytic lesions involving the thoracic spine, left scapula, and multiple left ribs, as described above, compatible with multiple myeloma.  Multiple thoracic compression deformities.  Tumor involves the right posterior elements at T6 extends into the epidural space from T6-9, better evaluated on MRI.  Lesion in the left scapula is notable for pathologic fracture.  Dominant left anterior fourth rib lesion measures 3.1 x 2.2 cm.  CT ABDOMEN AND PELVIS  Findings:  Liver, spleen, pancreas, and adrenal glands are within normal limits.  Gallbladder is underdistended.  No intrahepatic or extrahepatic ductal dilatation.  Left renal sinus cysts.  Kidneys are otherwise unremarkable.  No hydronephrosis.  No evidence of bowel obstruction.  Large volume colonic stool burden, suggesting constipation.   Atherosclerotic calcifications of the abdominal aorta and branch vessels.  Small volume abdominopelvic ascites.  No suspicious abdominopelvic lymphadenopathy.  Uterus is unremarkable.  No adnexal masses.  Bladder is distended.  Mild superior endplate compression deformity at L3.  Grade 1 anterolisthesis of L4 on L5.  Diffusely heterogeneous appearance of the visualized axial and appendicular skeleton.  These findings are better evaluated on recent MRI.  Status post ORIF of a prior proximal left femur fracture.  IMPRESSION: Diffusely heterogeneous appearance of the visualized axial and appendicular skeleton, compatible with multiple myeloma, better evaluated on MRI.  Mild super endplate compression deformity at L3.  Large volume colonic stool burden, suggesting constipation.   Original Report Authenticated By: Charline Bills, M.D.    Mr Thoracic Spine W Wo Contrast  07/13/2012  *RADIOLOGY REPORT*  Clinical Data:  Multiple myeloma.  Back pain.  Evaluate for possible cord compression.  MRI THORACIC AND LUMBAR SPINE WITHOUT AND WITH CONTRAST  Technique:  Multiplanar and multiecho pulse sequences of the thoracic and lumbar spine were obtained without and with intravenous contrast.  Contrast: 11mL MULTIHANCE GADOBENATE DIMEGLUMINE 529 MG/ML IV SOLN  Comparison:  06/30/2012 plain film examination.  04/01/2011 CT.  No comparison MR.  MRI THORACIC SPINE  Findings: Single sagittal scout sequence of the cervical spine obtained for proper level assignment reveals cervical spondylotic changes with spinal stenosis C3-4 through C6-7 incompletely evaluated on the present exam.  Diffuse myeloma involvement throughout the thoracic spine. Moderate focal involvement of portions of the T1, T3 T6, T7, T8, T9 T10 and T11.  Thoracic kyphosis.  Compression of fracture with 80-90% loss of height and anterior wedge compression deformity T5 and T12 with mild retropulsion posterior-superior aspect of the T12 vertebra with mild cord  deflection.  T6 vertebral body tumor involvement extends into the right pedicle. Anterior wedge compression deformity of the T6 vertebra with 85-90% loss of height retropulsion and posterior aspect of the vertebra contributing to cord compression.  T7 mild anterior wedge compression deformity with 25% loss of height.  Tumor involvement extends into the posterior elements greater on the right involving the pedicles and facets with epidural extension.  T8 anterior wedge compression fracture with 80% loss of height anteriorly.  Tumor involvement extends into the right pedicle of facet.   Epidural extension tumor.  T9 40% loss of height with retropulsion of the posterior inferior aspect of the compressed vertebra greater to left midline.  Cord compression.  Extension of tumor to the proximal pedicle level. Epidural extension tumor.  Epidural  extension of tumor extends from the T5 through the T11 level. Prominent epidural tumor right posterior lateral position T6, T7 and T8 level causing cord compression with the cord displaced anteriorly into the left.  At the T9 level, epidural spread of tumor noted circumferentially greater to left midline and more notable dorsal canal. Cord compression greater on the left. Epidural tumor at the T9-10 level circumferential fashion with circumferential spinal stenosis and cord compression.  At the T10 and T11 level, epidural tumor left posterior lateral position without the cord compression.  Bilateral pleural effusions and adjacent atelectasis greater on the right.  IMPRESSION: Diffuse myeloma throughout the thoracic spine. Various degrees of vertebral body compression fractures, tumor involvement and epidural extension of tumor contributing to spinal stenosis and cord compression as detailed above.  Epidural tumor and cord compression is most notable from T6- T9.  Within the compressed cord there is slight increased signal suggesting edema.  MRI LUMBAR SPINE  Findings: Diffuse  myeloma throughout the lumbar spine and visualized aspect of the sacrum and pelvis (ileum, acetabulum and femoral heads).  Focal involvement most prominent L3 vertebral body.  Schmorl's node deformities of the L3 vertebral body most notable superior endplate with 25% loss of height centrally. Minimal retropulsion posterior- superior aspect L3 with minimal canal narrowing.  Focal involvement right aspect sacral - coccyx junction.  Mild bony expansion at this level.  No epidural extension tumor within the lumbar region.  Conus L2 level.  Extrarenal type pelvis of the kidneys more notable on the left.  L3-4:  Mild facet joint degenerative changes greater left.  L4-5:  Marked bilateral facet joint degenerative changes.  2.8 mm anterior slip of L4.  Bulge.  Very mild spinal stenosis.  L5-S1:  Mild facet joint degenerative changes.  Minimal bulge.  IMPRESSION: Diffuse myeloma throughout the lumbar spine and visualized aspect of the sacrum and pelvis (ileum, acetabulum and femoral heads).  Focal involvement most prominent L3 vertebral body.  Schmorl's node deformities of the L3 vertebral body most notable superior endplate with 25% loss of height centrally. Minimal retropulsion posterior- superior aspect L3 with minimal canal narrowing.  Focal involvement right aspect sacral - coccyx junction.  Mild bony expansion at this level.  No epidural extension tumor within the lumbar region.  Degenerative changes most notable L4-5 level.  Critical Value/emergent results were called by telephone at the time of interpretation on 07/13/2012 at 10:12 am. to Dr. Gaylyn Rong, who verbally acknowledged these results.   Original Report Authenticated By: Lacy Duverney, M.D.    Mr Lumbar Spine W Wo Contrast  07/13/2012  *RADIOLOGY REPORT*  Clinical Data:  Multiple myeloma.  Back pain.  Evaluate for possible cord compression.  MRI THORACIC AND LUMBAR SPINE WITHOUT AND WITH CONTRAST  Technique:  Multiplanar and multiecho pulse sequences of the  thoracic and lumbar spine were obtained without and with intravenous contrast.  Contrast: 11mL MULTIHANCE GADOBENATE DIMEGLUMINE 529 MG/ML IV SOLN  Comparison:  06/30/2012 plain film examination.  04/01/2011 CT.  No comparison MR.  MRI THORACIC SPINE  Findings: Single sagittal scout sequence of the cervical spine obtained for proper level assignment reveals cervical spondylotic changes with spinal stenosis C3-4 through C6-7 incompletely evaluated on the present exam.  Diffuse myeloma involvement throughout the thoracic spine. Moderate focal involvement of portions of the T1, T3 T6, T7, T8, T9 T10 and T11.  Thoracic kyphosis.  Compression of fracture with 80-90% loss of height and anterior wedge compression deformity T5 and T12 with mild retropulsion posterior-superior  aspect of the T12 vertebra with mild cord deflection.  T6 vertebral body tumor involvement extends into the right pedicle. Anterior wedge compression deformity of the T6 vertebra with 85-90% loss of height retropulsion and posterior aspect of the vertebra contributing to cord compression.  T7 mild anterior wedge compression deformity with 25% loss of height.  Tumor involvement extends into the posterior elements greater on the right involving the pedicles and facets with epidural extension.  T8 anterior wedge compression fracture with 80% loss of height anteriorly.  Tumor involvement extends into the right pedicle of facet.   Epidural extension tumor.  T9 40% loss of height with retropulsion of the posterior inferior aspect of the compressed vertebra greater to left midline.  Cord compression.  Extension of tumor to the proximal pedicle level. Epidural extension tumor.  Epidural extension of tumor extends from the T5 through the T11 level. Prominent epidural tumor right posterior lateral position T6, T7 and T8 level causing cord compression with the cord displaced anteriorly into the left.  At the T9 level, epidural spread of tumor noted  circumferentially greater to left midline and more notable dorsal canal. Cord compression greater on the left. Epidural tumor at the T9-10 level circumferential fashion with circumferential spinal stenosis and cord compression.  At the T10 and T11 level, epidural tumor left posterior lateral position without the cord compression.  Bilateral pleural effusions and adjacent atelectasis greater on the right.  IMPRESSION: Diffuse myeloma throughout the thoracic spine. Various degrees of vertebral body compression fractures, tumor involvement and epidural extension of tumor contributing to spinal stenosis and cord compression as detailed above.  Epidural tumor and cord compression is most notable from T6- T9.  Within the compressed cord there is slight increased signal suggesting edema.  MRI LUMBAR SPINE  Findings: Diffuse myeloma throughout the lumbar spine and visualized aspect of the sacrum and pelvis (ileum, acetabulum and femoral heads).  Focal involvement most prominent L3 vertebral body.  Schmorl's node deformities of the L3 vertebral body most notable superior endplate with 25% loss of height centrally. Minimal retropulsion posterior- superior aspect L3 with minimal canal narrowing.  Focal involvement right aspect sacral - coccyx junction.  Mild bony expansion at this level.  No epidural extension tumor within the lumbar region.  Conus L2 level.  Extrarenal type pelvis of the kidneys more notable on the left.  L3-4:  Mild facet joint degenerative changes greater left.  L4-5:  Marked bilateral facet joint degenerative changes.  2.8 mm anterior slip of L4.  Bulge.  Very mild spinal stenosis.  L5-S1:  Mild facet joint degenerative changes.  Minimal bulge.  IMPRESSION: Diffuse myeloma throughout the lumbar spine and visualized aspect of the sacrum and pelvis (ileum, acetabulum and femoral heads).  Focal involvement most prominent L3 vertebral body.  Schmorl's node deformities of the L3 vertebral body most notable  superior endplate with 25% loss of height centrally. Minimal retropulsion posterior- superior aspect L3 with minimal canal narrowing.  Focal involvement right aspect sacral - coccyx junction.  Mild bony expansion at this level.  No epidural extension tumor within the lumbar region.  Degenerative changes most notable L4-5 level.  Critical Value/emergent results were called by telephone at the time of interpretation on 07/13/2012 at 10:12 am. to Dr. Gaylyn Rong, who verbally acknowledged these results.   Original Report Authenticated By: Lacy Duverney, M.D.    Ct Abdomen Pelvis W Contrast  07/13/2012  *RADIOLOGY REPORT*  Clinical Data:  Multiple myeloma, evaluate for extramedullary disease.  Chest pain,  weight loss, abdominal pain, nausea, constipation, prior appendectomy.  CT CHEST, ABDOMEN AND PELVIS WITH CONTRAST  Technique:  Multidetector CT imaging of the chest, abdomen and pelvis was performed following the standard protocol during bolus administration of intravenous contrast.  Contrast: OMNIPAQUE IOHEXOL 300 MG/ML  SOLN  Comparison:  MRI thoracic/lumbar spine dated 07/13/2012.  CTA chest dated 04/01/2011.  CT CHEST  Findings:  Biapical pleural parenchymal scarring.  Patchy opacity in the superior segment right lower lobe (series 5/image 15), possibly scarring and/or atelectasis.  Small right and trace left pleural effusions with associated lower lobe atelectasis.  Mild linear scarring in the right middle lobe.  No pneumothorax.  Visualized thyroid is unremarkable.  Mild cardiomegaly.  Coronary atherosclerosis.  No suspicious mediastinal, hilar, or axillary lymphadenopathy.  Multiple thoracic compression deformities.  Tumor most prominently involves the right posterior elements at T6 (series 2/image 20) and extends into the epidural space from T6-T9 (series 2/image 26). These findings are better evaluated on recent MRI thoracic spine.  Destructive lytic lesion in the left inferior scapula with associated  pathologic fracture (sagittal image 98).  Expansile lytic lesions involving the left anterior third fourth ribs as well as the left lateral sixth rib. Left fourth rib lesion measures 3.1 x 2.2 cm (series 2/image 33).  Multiple additional power rib deformities, many of which are likely chronic. Old displaced sternal fracture (sagittal image 53). Diffusely heterogeneous appearance of the visualized axial and appendicular skeleton.  IMPRESSION: Multiple destructive lytic lesions involving the thoracic spine, left scapula, and multiple left ribs, as described above, compatible with multiple myeloma.  Multiple thoracic compression deformities.  Tumor involves the right posterior elements at T6 extends into the epidural space from T6-9, better evaluated on MRI.  Lesion in the left scapula is notable for pathologic fracture.  Dominant left anterior fourth rib lesion measures 3.1 x 2.2 cm.  CT ABDOMEN AND PELVIS  Findings:  Liver, spleen, pancreas, and adrenal glands are within normal limits.  Gallbladder is underdistended.  No intrahepatic or extrahepatic ductal dilatation.  Left renal sinus cysts.  Kidneys are otherwise unremarkable.  No hydronephrosis.  No evidence of bowel obstruction.  Large volume colonic stool burden, suggesting constipation.  Atherosclerotic calcifications of the abdominal aorta and branch vessels.  Small volume abdominopelvic ascites.  No suspicious abdominopelvic lymphadenopathy.  Uterus is unremarkable.  No adnexal masses.  Bladder is distended.  Mild superior endplate compression deformity at L3.  Grade 1 anterolisthesis of L4 on L5.  Diffusely heterogeneous appearance of the visualized axial and appendicular skeleton.  These findings are better evaluated on recent MRI.  Status post ORIF of a prior proximal left femur fracture.  IMPRESSION: Diffusely heterogeneous appearance of the visualized axial and appendicular skeleton, compatible with multiple myeloma, better evaluated on MRI.  Mild super  endplate compression deformity at L3.  Large volume colonic stool burden, suggesting constipation.   Original Report Authenticated By: Charline Bills, M.D.    Dg Bone Survey Met  06/30/2012  *RADIOLOGY REPORT*  Clinical Data: Monoclonal gammopathy.  Rule out myeloma.  Left scapular pain.  Back pain.  METASTATIC BONE SURVEY  Comparison: Thoracic spine images 06/05/2012.  Left shoulder images 05/18/2012.  Findings:  The following bones were evaluated:  Lateral skull: Numerous lytic lesions throughout the calvarium of different sizes.  Cervical spine, AP and lateral views: Extensive degenerative facet disease.  This spaces are maintained.  Early anterior degenerative spurring.  Diffuse osteopenia.  Thoracic spine, AP and lateral views: Moderate to severe compression  fractures within the multiple levels of the thoracic spine, T5-T9 as well as T12.  These are stable since recent thoracic spine images.  Associated kyphosis.  Lumbar spine, AP and lateral views: Diffuse osteopenia. Degenerative facet disease in the mid and lower lumbar spine. Associated slight anterolisthesis of L4 on L5.  Slight compression involving the L3 superior and inferior endplates.  Diffuse osteopenia.  Pelvis, AP view: No definite focal lytic lesions.  Moderate stool and bowel gas projects over much of the bony pelvis, obscuring fine bony detail.  Diffuse osteopenia.  Bilateral femora, AP views: Dynamic hip screw and intramedullary nail within the proximal left femur.  This crosses an old healed femoral neck fracture.  No focal bony abnormality.  Degenerative changes in the knees bilaterally, left greater than right.  Bilateral shoulders, AP views: Widening of the right AC joint, likely from prior surgical resection, stable since 2012. Old left clavicle fracture seen on prior left shoulder series not as well visualized.  There is a lytic lesion within the lateral left scapula.  Multiple old left rib fractures involving the left fifth through  ninth ribs.  Bilateral humeri, AP views: No focal lytic lesion or acute bony abnormality.  Postoperative changes at the left elbow.  Chest, single view:  Old left rib fractures.  Heart is upper limits normal in size.  Small right pleural effusion.  Right base atelectasis.  IMPRESSION: Numerous lytic lesions throughout the calvarium.  Lytic lesion within the left lateral scapula.  Findings most compatible with metastases or myeloma.  Multiple mid and lower thoracic compression fractures.  Slight compression through the endplates at L3.  Multiple old left rib fractures.  Small right pleural effusion.  Right base atelectasis.   Original Report Authenticated By: Charlett Nose, M.D.        ASSESSMENT AND PLAN:   1.  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 Margaretmary Dys who graciously agreed to evaluate patient today for urgent palliative radiation. - We started patient on Dexamethasone 4mg  PO q6hour.  I advised patient to continue this dose until she is finished with radiation at which time we can start taper.  - I will refer patient to PT/OT for a brace to stabilize her back.  - I also advised patient to see her dentist for a dental clearance before starting Zometa once monthly.  Zometa has been proven in patients with myeloma to decrease risk of skeletal related event.    2.  IgG kappa multiple myeloma: - I recommended to delay start of treatment until after finish of radiation which is more acute issue.  - Options for chemo:  *  Off of trial:  Combination chemo Revlimid/Dexamethasone/SQ velcade.  I still do not have her myeloma FISH panel back or cytogenetics; however, the fact that she has cord compression is clinically high risk to me.  Combination therapy has higher chance of deep and fast remission which may correlate with improvement in overall survival.    *Clinical trial Revlimid/Dexamethasone/ and an oral experimental proteosome inhibitor agent (in place of Velcade).    One caveat is that she will have to be at least 14 days out after radiation to start this trial.  She will discuss with her family to see if she wants to wait this long after radiation to start chemo.   - Potential side effects of these combination chemoregimens include but not limited to fatigue, mouth sore, low blood count, bleeding, blood clot, infection, diarrhea/constipation, abnormal electrolytes, neuropathy.   -  I also recommended referral to [facility] with Dr. Barbaraann Boys for evaluation about role of autologous hematopoetic stem cell transplant.  They agreed to this.  I made the referral for within the next 2-3 weeks.   3.  Pain control:  MS contin, Vicodin, and gabapentin.  Pain is under relatively good control.  I advised her again on bowel regimen.   4.  Anemia:  Acute on chronic.  Anemia work up was negative.  This is most likely due to myeloma.  There is no active bleeding or symptomatic anemia; there is no indication for blood transfusion.  5.  Follow up:  In about 2-3 weeks when she has finished radiation.   The length of time of the face-to-face encounter was 45 minutes. More than 50% of time was spent counseling and coordination of care.

## 2012-07-14 NOTE — Telephone Encounter (Signed)
s/w pt and advised on Jan 2014 appt...pt aware that [facility] will contact her with appt with Dr. Mellody Memos...gave Selena Batten in medical records referral..S/w Vilinda Flake and confirmed that all imaging appts have been scheduled...wegenka confirmed they had all been completed except one that she cancelled b/c they wanted to wait to do the study.

## 2012-07-16 ENCOUNTER — Encounter: Payer: Self-pay | Admitting: *Deleted

## 2012-07-16 ENCOUNTER — Telehealth: Payer: Self-pay | Admitting: *Deleted

## 2012-07-16 ENCOUNTER — Ambulatory Visit
Admission: RE | Admit: 2012-07-16 | Discharge: 2012-07-16 | Disposition: A | Discharge status: Home / Self Care | Payer: Medicare Other | Source: Ambulatory Visit | Attending: Radiation Oncology | Admitting: Radiation Oncology

## 2012-07-16 LAB — UIFE/LIGHT CHAINS/TP QN, 24-HR UR
Beta, Urine: DETECTED — AB
Free Kappa Lt Chains,Ur: 32.4 mg/dL — ABNORMAL HIGH (ref 0.14–2.42)
Free Lambda Lt Chains,Ur: 0.2 mg/dL (ref 0.02–0.67)
Free Lt Chn Excr Rate: 388.8 mg/d
Gamma Globulin, Urine: DETECTED — AB
Total Protein, Urine-Ur/day: 397 mg/d — ABNORMAL HIGH (ref 10–140)
Volume, Urine: 1200 mL

## 2012-07-16 LAB — CREATININE CLEARANCE, URINE, 24 HOUR
Collection Interval-CRCL: 24 hours
Creatinine, 24H Ur: 911 mg/d (ref 700–1800)
Creatinine, Urine: 75.9 mg/dL

## 2012-07-16 MED ORDER — ONDANSETRON HCL 4 MG PO TABS: 4.0000 mg | ORAL_TABLET | Freq: Three times a day (TID) | ORAL | Status: DC | PRN

## 2012-07-16 NOTE — Progress Notes (Signed)
Received letter from Dr. Shelda Altes, DDS,  Pt is cleared for Zometa.  Forwarded to Dr. Gaylyn Rong for review.

## 2012-07-16 NOTE — Telephone Encounter (Signed)
Pt requesting refill on zofran from Dr. Gaylyn Rong.  Was originally prescribed by Dr. [name],  But pt asks if Dr. Gaylyn Rong will take over.

## 2012-07-16 NOTE — Telephone Encounter (Signed)
Yep. OK to fil Zofran.  Thanks.

## 2012-07-16 NOTE — Telephone Encounter (Signed)
Called pt and informed of refill on zofran sent to [facility] per Dr. Gaylyn Rong.  She verbalized understanding.

## 2012-07-17 ENCOUNTER — Ambulatory Visit: Payer: Medicare Other | Admitting: Radiation Oncology

## 2012-07-17 ENCOUNTER — Other Ambulatory Visit (HOSPITAL_COMMUNITY): Payer: Medicare Other

## 2012-07-17 ENCOUNTER — Ambulatory Visit
Admission: RE | Admit: 2012-07-17 | Discharge: 2012-07-17 | Disposition: A | Discharge status: Home / Self Care | Payer: Medicare Other | Source: Ambulatory Visit | Attending: Radiation Oncology | Admitting: Radiation Oncology

## 2012-07-17 ENCOUNTER — Encounter: Payer: Self-pay | Admitting: Radiation Oncology

## 2012-07-17 VITALS — BP 147/79 | HR 90 | Temp 97.6°F | Resp 20 | Wt 123.5 lb

## 2012-07-17 DIAGNOSIS — G9520 Unspecified cord compression: Secondary | ICD-10-CM

## 2012-07-17 DIAGNOSIS — C9000 Multiple myeloma not having achieved remission: Secondary | ICD-10-CM

## 2012-07-17 NOTE — Progress Notes (Signed)
Post sim ed completed w/pt and husband. Gave pt "Radiation and You" booklet w/pertinent information marked and discussed, re: fatigue, nausea, skin irritation/care, pain, nutrition. All questions answered.

## 2012-07-17 NOTE — Progress Notes (Signed)
Post sim ed completed w/pt and husband. Gave pt "Radiation and You" booklet w/pertinent information marked and discussed, re: fatigue, nausea, skin irritation/care, pain, nutrition. All questions answered. Pt c/o occasional nausea, takes Zofran bid w/fair relief, states her appetite is improving. Pt has mild mid back pain, takes Morphine 15 mg bid, Hydrocodone prn breakthrough pain. She states pain sometimes affected by certain movements. She states pain is never completely relieved.

## 2012-07-17 NOTE — Progress Notes (Signed)
  Radiation Oncology         [phone] ________________________________  Name: Shannon Rush MRN: [id number]  Date: 07/17/2012  DOB: 08/07/1940  Weekly Radiation Therapy Management  Current Dose: 8 Gy     Planned Dose:  20 Gy  Narrative . . . . . . . . The patient presents for routine under treatment assessment.                                                   Post sim ed completed w/pt and husband. Gave pt "Radiation and You" booklet w/pertinent information marked and discussed, re: fatigue, nausea, skin irritation/care, pain, nutrition. All questions answered. Pt c/o occasional nausea, takes Zofran bid w/fair relief, states her appetite is improving. Pt has mild mid back pain, takes Morphine 15 mg bid, Hydrocodone prn breakthrough pain. She states pain sometimes affected by certain movements. She states pain is never completely relieved   The patient is without complaint.                                 Set-up films were reviewed.                                 The chart was checked. Physical Findings. . .  weight is 123 lb 8 oz (56.019 kg). Her oral temperature is 97.6 F (36.4 C). Her blood pressure is 147/79 and her pulse is 90. Her respiration is 20. . Weight essentially stable.  No significant changes. Impression . . . . . . . The patient is  tolerating radiation. Plan . . . . . . . . . . . . Continue treatment as planned.  ________________________________  Artist Pais. Kathrynn Running, M.D.

## 2012-07-20 ENCOUNTER — Ambulatory Visit: Payer: Medicare Other

## 2012-07-20 ENCOUNTER — Ambulatory Visit
Admission: RE | Admit: 2012-07-20 | Discharge: 2012-07-20 | Disposition: A | Discharge status: Home / Self Care | Payer: Medicare Other | Source: Ambulatory Visit | Attending: Radiation Oncology | Admitting: Radiation Oncology

## 2012-07-20 ENCOUNTER — Telehealth: Payer: Self-pay | Admitting: Oncology

## 2012-07-20 ENCOUNTER — Encounter: Payer: Self-pay | Admitting: Radiation Oncology

## 2012-07-20 NOTE — Telephone Encounter (Signed)
Pt appt to see Dr. Freada Bergeron 08/06/12 @10 :00. Medical  Records faxed,slides will be fedex'ed pt is aware

## 2012-07-21 ENCOUNTER — Ambulatory Visit
Admission: RE | Admit: 2012-07-21 | Discharge: 2012-07-21 | Disposition: A | Discharge status: Home / Self Care | Payer: Medicare Other | Source: Ambulatory Visit | Attending: Radiation Oncology | Admitting: Radiation Oncology

## 2012-07-22 ENCOUNTER — Ambulatory Visit
Admission: RE | Admit: 2012-07-22 | Discharge: 2012-07-22 | Disposition: A | Discharge status: Home / Self Care | Payer: Medicare Other | Source: Ambulatory Visit | Attending: Radiation Oncology | Admitting: Radiation Oncology

## 2012-07-22 ENCOUNTER — Encounter: Payer: Self-pay | Admitting: Radiation Oncology

## 2012-07-22 NOTE — Progress Notes (Signed)
  Radiation Oncology         [phone] ________________________________  Name: AMANY RANDO MRN: [id number]  Date: 07/22/2012  DOB: 08-11-1940  VIRTUAL SIMULATION NOTE  NARRATIVE:  The patient underwent simulation today for additional radiation therapy to a second treatment site.  The existing CT study set was employed for the purpose of complex virtual treatment planning.  The new target and avoidance structure in the left scapula was added.  Treatment planning then occurred.  The radiation boost prescription was entered and confirmed.  A total of 2 complex treatment devices were fabricated in the form of multi-leaf collimators to shape radiation around the targets while maximally excluding nearby normal structures. I have requested : Isodose Plan.   PLAN:  This modified radiation beam arrangement is intended to continue the current radiation dose to an additional 20 Gy in 5 fractions as she completes her spinal radiotherapy for palliation of pain.  ------------------------------------------------  Artist Pais. Kathrynn Running, M.D.

## 2012-07-22 NOTE — Progress Notes (Signed)
  Radiation Oncology         [phone] ________________________________  Name: Shannon Rush MRN: [id number]  Date: 07/20/2012  DOB: [birth year]  Simulation Verification Note  Status: outpatient  NARRATIVE: The patient was brought to the treatment unit and placed in the planned treatment position. The clinical setup was verified. Then port films were obtained and uploaded to the radiation oncology medical record software.  The treatment beams were carefully compared against the planned radiation fields. The position location and shape of the radiation fields was reviewed. They targeted volume of tissue appears to be appropriately covered by the radiation beams. Organs at risk appear to be excluded as planned.  Based on my personal review, I approved the simulation verification. The patient's treatment will proceed as planned.  ------------------------------------------------  Artist Pais Kathrynn Running, M.D.

## 2012-07-23 ENCOUNTER — Ambulatory Visit
Admission: RE | Admit: 2012-07-23 | Discharge: 2012-07-23 | Disposition: A | Discharge status: Home / Self Care | Payer: Medicare Other | Source: Ambulatory Visit | Attending: Radiation Oncology | Admitting: Radiation Oncology

## 2012-07-23 ENCOUNTER — Encounter: Payer: Self-pay | Admitting: *Deleted

## 2012-07-23 DIAGNOSIS — C9000 Multiple myeloma not having achieved remission: Secondary | ICD-10-CM

## 2012-07-23 NOTE — Progress Notes (Signed)
07/23/12. Met with Mrs. Llamas this afternoon before her radiation treatment. She has expressed some hesitation about participating in the study D/T her concern about the the reduired for treatment. Discussed the schedules for both the protocol and standard of care with Revimid and velcade.  She is still considering participating in the study. She is aware the bone marrow biopsy does not need to be repeated for eligibility. She has agreed to have the DEXA scan scheduled.  She will discuss this further with Dr. Gaylyn Rong next week.

## 2012-07-24 ENCOUNTER — Encounter: Payer: Self-pay | Admitting: Radiation Oncology

## 2012-07-24 ENCOUNTER — Ambulatory Visit
Admission: RE | Admit: 2012-07-24 | Discharge: 2012-07-24 | Disposition: A | Discharge status: Home / Self Care | Payer: Medicare Other | Source: Ambulatory Visit | Attending: Radiation Oncology | Admitting: Radiation Oncology

## 2012-07-24 VITALS — BP 167/94 | HR 75 | Resp 16 | Wt 118.0 lb

## 2012-07-24 DIAGNOSIS — C9000 Multiple myeloma not having achieved remission: Secondary | ICD-10-CM

## 2012-07-24 MED ORDER — DEXAMETHASONE 4 MG PO TABS: 4.0000 mg | ORAL_TABLET | ORAL | Status: DC

## 2012-07-24 NOTE — Progress Notes (Addendum)
  Radiation Oncology         [phone] ________________________________  Name: Shannon Rush MRN: [id number]  Date: 07/24/2012  DOB: 06-Apr-1941  Weekly Radiation Therapy Management  Current Dose: 18 Gy     Planned Dose:  20 Gy  Narrative . . . . . . . . The patient presents for the final under treatment assessment.                                 The patient has had some continuation of pain in the back and shoulder, but much improved.                              Patient presents to the clinic today accompanied by her husband for a PUT with Dr. Kathrynn Running. Patient scheduled to complete treatment on Monday therefore an appointment card for a one month follow up was given. Patient reports that spine and shoulder pain have greatly decreased. Patient reports taking morphine 15 mg at 1130 today. Patient reports that presently her pain is 0.2 in her spine on a scale of 0-10. Patient toying with the idea of stretching the interval at which she takes her morphine. Patient reports that she is no longer taking gabapentin. Patient reports taking vicodin only prior to radiation treatment and for rare break through pain. Patient reports taking decadron 4 mg qid. Patient inquired about tapering dose today. Patient reports moving bowels without difficulty. Patient reports she used the stationary bike and did some water aerobics at the [facility] today. Blood pressure elevated. Encouraged patient to check bp while at [facility] daily and record. Encouraged patient to contact PCP if BP remains high       Set-up films were reviewed.                                  The chart was checked.  Physical Findings. . . Weight essentially stable.  Filed Vitals:   07/24/12 1700  BP: 167/94  Pulse: 75  Resp: 16  Patient alert and oriented to person, place, and time. No distress noted. Steady gait noted. Pleasant affect notedNo significant changes.  Impression . . . . . . . The patient tolerated radiation relatively well.  Plan .  . . . . . . . . . . . Complete radiation today as scheduled, and follow-up in one month. The patient was encouraged to call or return to the clinic in the interim for any worsening symptoms.  Steroid taper given.  ________________________________  Artist Pais. Kathrynn Running, M.D.

## 2012-07-24 NOTE — Progress Notes (Signed)
Patient presents to the clinic today accompanied by her husband for a PUT with Dr. Kathrynn Running. Patient scheduled to complete treatment on Monday therefore an appointment card for a one month follow up was given. Patient alert and oriented to person, place, and time. No distress noted. Steady gait noted. Pleasant affect noted. Patient reports that spine and shoulder pain have greatly decreased. Patient reports taking morphine 15 mg at 1130 today. Patient reports that presently her pain is 0.2 in her spine on a scale of 0-10. Patient toying with the idea of stretching the interval at which she takes her morphine. Patient reports that she is no longer taking gabapentin. Patient reports taking vicodin only prior to radiation treatment and for rare break through pain. Patient reports taking decadron 4 mg qid. Patient inquired about tapering dose today. Patient reports moving bowels without difficulty. Patient reports she used the stationary bike and did some water aerobics at the [facility] today. Blood pressure elevated. Encouraged patient to check bp while at [facility] daily and record. Encouraged patient to contact PCP if BP remains high. Reported all findings to Dr. Kathrynn Running.

## 2012-07-27 ENCOUNTER — Telehealth: Payer: Self-pay | Admitting: Oncology

## 2012-07-27 ENCOUNTER — Ambulatory Visit: Payer: Medicare Other

## 2012-07-27 ENCOUNTER — Encounter: Payer: Self-pay | Admitting: Radiation Oncology

## 2012-07-27 ENCOUNTER — Ambulatory Visit
Admission: RE | Admit: 2012-07-27 | Discharge: 2012-07-27 | Disposition: A | Discharge status: Home / Self Care | Payer: Medicare Other | Source: Ambulatory Visit | Attending: Radiation Oncology | Admitting: Radiation Oncology

## 2012-07-27 NOTE — Progress Notes (Signed)
  Radiation Oncology         [phone] ________________________________  Name: Shannon Rush MRN: [id number]  Date: 07/27/2012  DOB: [birth year]  End of Treatment Note  Diagnosis:   72 yo woman with spinal cord compression from multiple myeloma in the thoracic spine and left scapular and involvement.  Indication for treatment:   palliation, preservation of spinal cord function.        Radiation treatment dates:  07/13/2012-07/27/2012  Site/dose:    1. The thoracic spine from T3-T10 inclusive was treated to 20 gray in 10 fractions of 2 gray 2. The metastasis involving the tip of the left scapula was treated to 20 gray in 5 fractions of 4 gray   Beams/energy:    1. The patient's 6 thoracic spine was conformally treated using anterior and posterior radiation fields delivering 10 megavolt photons from the anterior ganty positions 6 megavolt photons from the posterior  Gantry position. This asymmetric energy was designed to provide better distribution along the posterior spine region while minimizing radiation exposure the heart and anterior mediastinum. 2. The patient's left scapula started radiation therapy during the second week of spinal radiation because of patient's complaints of pain. Her treatment was setup in the same body position using virtual simulation on her original CAT scan to design 2 oblique radiation fields characterized as left anterior oblique and right posterior oblique delivered with 6 megavolt photons and conformal blocking.  Narrative: The patient tolerated radiation treatment relatively well.   Her pain seemed to improve during the course of radiation and her spinal cord function remained entirely intact.  Plan: The patient has completed radiation treatment. The patient will return to radiation oncology clinic for routine followup in one month. I advised them to call or return sooner if they have any questions or concerns related to their recovery or  treatment. ________________________________  Artist Pais. Kathrynn Running, M.D.

## 2012-07-27 NOTE — Telephone Encounter (Signed)
lvm for pt regarding 1.16.14 appt.Marland KitchenMarland KitchenMarland KitchenMarland Kitchenpt aware

## 2012-07-28 ENCOUNTER — Ambulatory Visit: Payer: Medicare Other

## 2012-07-29 ENCOUNTER — Ambulatory Visit: Payer: Medicare Other | Attending: Oncology | Admitting: Physical Therapy

## 2012-07-29 ENCOUNTER — Ambulatory Visit (HOSPITAL_BASED_OUTPATIENT_CLINIC_OR_DEPARTMENT_OTHER): Payer: Medicare Other | Admitting: Oncology

## 2012-07-29 ENCOUNTER — Ambulatory Visit: Payer: Medicare Other

## 2012-07-29 ENCOUNTER — Other Ambulatory Visit (HOSPITAL_BASED_OUTPATIENT_CLINIC_OR_DEPARTMENT_OTHER): Payer: Medicare Other | Admitting: Lab

## 2012-07-29 ENCOUNTER — Encounter: Payer: Self-pay | Admitting: *Deleted

## 2012-07-29 ENCOUNTER — Telehealth: Payer: Self-pay | Admitting: Oncology

## 2012-07-29 VITALS — BP 139/91 | HR 93 | Temp 97.0°F | Resp 18 | Ht 68.0 in | Wt 114.8 lb

## 2012-07-29 DIAGNOSIS — M24519 Contracture, unspecified shoulder: Secondary | ICD-10-CM | POA: Insufficient documentation

## 2012-07-29 DIAGNOSIS — R5381 Other malaise: Secondary | ICD-10-CM | POA: Insufficient documentation

## 2012-07-29 DIAGNOSIS — R634 Abnormal weight loss: Secondary | ICD-10-CM

## 2012-07-29 DIAGNOSIS — M810 Age-related osteoporosis without current pathological fracture: Secondary | ICD-10-CM | POA: Insufficient documentation

## 2012-07-29 DIAGNOSIS — D649 Anemia, unspecified: Secondary | ICD-10-CM

## 2012-07-29 DIAGNOSIS — R269 Unspecified abnormalities of gait and mobility: Secondary | ICD-10-CM | POA: Insufficient documentation

## 2012-07-29 DIAGNOSIS — C9000 Multiple myeloma not having achieved remission: Secondary | ICD-10-CM

## 2012-07-29 DIAGNOSIS — R279 Unspecified lack of coordination: Secondary | ICD-10-CM | POA: Insufficient documentation

## 2012-07-29 DIAGNOSIS — R52 Pain, unspecified: Secondary | ICD-10-CM

## 2012-07-29 DIAGNOSIS — IMO0001 Reserved for inherently not codable concepts without codable children: Secondary | ICD-10-CM | POA: Insufficient documentation

## 2012-07-29 LAB — BASIC METABOLIC PANEL (CC13)
CO2: 26 mEq/L (ref 22–29)
Calcium: 8.7 mg/dL (ref 8.4–10.4)
Glucose: 136 mg/dl — ABNORMAL HIGH (ref 70–99)
Potassium: 4.4 mEq/L (ref 3.5–5.1)
Sodium: 132 mEq/L — ABNORMAL LOW (ref 136–145)

## 2012-07-29 LAB — CBC WITH DIFFERENTIAL/PLATELET
BASO%: 0 % (ref 0.0–2.0)
Eosinophils Absolute: 0 10*3/uL (ref 0.0–0.5)
MCHC: 34.7 g/dL (ref 31.5–36.0)
MONO#: 0.5 10*3/uL (ref 0.1–0.9)
MONO%: 5.8 % (ref 0.0–14.0)
NEUT#: 7.6 10*3/uL — ABNORMAL HIGH (ref 1.5–6.5)
RBC: 3.21 10*6/uL — ABNORMAL LOW (ref 3.70–5.45)
RDW: 15.9 % — ABNORMAL HIGH (ref 11.2–14.5)
WBC: 8.4 10*3/uL (ref 3.9–10.3)

## 2012-07-29 MED ORDER — LENALIDOMIDE 25 MG PO CAPS: 25.0000 mg | ORAL_CAPSULE | Freq: Every day | ORAL | Status: DC

## 2012-07-29 MED ORDER — ACYCLOVIR 400 MG PO TABS: 400.0000 mg | ORAL_TABLET | Freq: Two times a day (BID) | ORAL | Status: DC

## 2012-07-29 MED ORDER — ENOXAPARIN SODIUM 40 MG/0.4ML ~~LOC~~ SOLN: 40.0000 mg | SUBCUTANEOUS | Status: DC

## 2012-07-29 MED ORDER — DEXAMETHASONE 4 MG PO TABS: 40.0000 mg | ORAL_TABLET | ORAL | Status: DC

## 2012-07-29 MED ORDER — ACYCLOVIR 200 MG PO CAPS: 400.0000 mg | ORAL_CAPSULE | Freq: Every day | ORAL | Status: DC

## 2012-07-29 NOTE — Progress Notes (Signed)
Shannon Rush  Telephone:([phone]) [phone] Fax:(336) [phone]   OFFICE PROGRESS NOTE   Cc:  Shannon Letters, Shannon Rush  DIAGNOSIS: multiple myeloma, presented with lytic bone lesions, anemia (Hgb 9.3). Presenting IgG was 4,040 mg/dL on 62/13/0865 (IgA 35; IgM 34); kappa free light chain 34.4 mg/dL, lambda 7.84, kappa:lambda ratio of 34.75; SPEP with M-spike of 2.60.  Bone marrow biopsy on 07/03/2012 showed 49% plasma cell.  Normal classical cytogenetics; however, myeloma FISH panel showed 13q-.   PAST THERAPY:  Palliative radiation 20 Gy over 10 fractions between 07/14/2012 and 07/27/2012 to thoracic spine cord compression and symptomatic left scapula lytic lesion.   CURRENT THERAPY:  Due to start chemo soon.   INTERVAL HISTORY: Shannon Rush 72 y.o. female returns for regular follow up with her husband and daughter.  She tolerated radiation well. She no longer has upper back and left scapula.  She still has some numbness and tingling of bilateral lower extremities.  She is able to walk but she still needs a walker. She is tapering down her Decadron. She is undergoing PT.  She denied bowel/bladder incontinence.  She is taking MS Contin twice daily without needing Vicodin. She has mild constipation.  She has low appetite and continues to lose weight.  She does not want to take too many medications.  She has moderate fatigue; and spends most of her awake time at rest. She has mild SOB and DOE but without chest pain, palpitation, pedal edema.   Patient denies fever, headache, visual changes, confusion, drenching night sweats, palpable lymph node swelling, mucositis, odynophagia, dysphagia, nausea vomiting, jaundice, chest pain, palpitation, productive cough, gum bleeding, epistaxis, hematemesis, hemoptysis, abdominal pain, abdominal swelling, early satiety, melena, hematochezia, hematuria, skin rash, spontaneous bleeding, joint swelling, heat or cold intolerance, depression. .    Past  Medical History  Diagnosis Date  . Multiple myeloma 07/01/2012  . Unspecified deficiency anemia   . OSTEOPOROSIS 06/11/2010    Multiple compression fractures; and spontaneous fracture of sternum Qualifier: Diagnosis of  By: Humberto Seals NP, Darl Pikes     . 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 Cord compression 07/13/12    MRI- diffuse myeloma involvement of T-L spine    Past Surgical History  Procedure Date  . Appendectomy   . Tubal ligation   . Cesarean section     x2   . Hip surgery 2009    left  . Elbow surgery   . Colonoscopy 2007    neg with Dr. Ewing Schlein    Current Outpatient Prescriptions  Medication Sig Dispense Refill  . dexamethasone (DECADRON) 4 MG tablet Take 1 tablet (4 mg total) by mouth as directed. Take 4 mg three times per day for 5 days, then Take 4 mg twice per day for 5 days, then Take 2 mg three times per day for 5 days, then Take 2 mg twice per day for 5 days, then Take 2 mg once per day for 5 days, then stop.  60 tablet  2  . gabapentin (NEURONTIN) 100 MG capsule Take 300 mg by mouth daily. Patient has the med at home, has used in the past and will escalate the dose.      Marland Kitchen acyclovir (ZOVIRAX) 200 MG capsule Take 2 capsules (400 mg total) by mouth daily.  30 capsule  3  . dexamethasone (DECADRON) 4 MG tablet Take 10 tablets (40 mg total) by mouth once a week.  100 tablet  6  . docusate  sodium (COLACE) 100 MG capsule Take 100 mg by mouth 2 (two) times daily as needed.       . enoxaparin (LOVENOX) 40 MG/0.4ML injection Inject 0.4 mLs (40 mg total) into the skin daily.  30 Syringe  3  . HYDROcodone-acetaminophen (VICODIN) 5-500 MG per tablet Take 1 tablet by mouth every 6 (six) hours as needed for pain.  100 tablet  3  . lenalidomide (REVLIMID) 25 MG capsule Take 1 capsule (25 mg total) by mouth daily.  21 capsule  0  . morphine (MS CONTIN) 15 MG 12 hr tablet Take 1 tablet (15 mg total) by mouth 2 (two) times daily.  60 tablet  0  . ondansetron (ZOFRAN) 4 MG  tablet Take 1 tablet (4 mg total) by mouth every 8 (eight) hours as needed.  30 tablet  3    ALLERGIES:   has no known allergies.  REVIEW OF SYSTEMS:  The rest of the 14-point review of system was negative.   Filed Vitals:   07/29/12 1428  BP: 139/91  Pulse: 93  Temp: 97 F (36.1 C)  Resp: 18   Wt Readings from Last 3 Encounters:  07/29/12 114 lb 12.8 oz (52.073 kg)  07/24/12 118 lb (53.524 kg)  07/17/12 123 lb 8 oz (56.019 kg)   ECOG Performance status: 1-2  PHYSICAL EXAMINATION:   General:  thin appearing woman, in no acute distress.  Eyes:  no scleral icterus.  ENT:  There were no oropharyngeal lesions.  Neck was without thyromegaly.  Lymphatics:  Negative cervical, supraclavicular or axillary adenopathy.  Respiratory: lungs were clear bilaterally without wheezing or crackles.  Cardiovascular:  Regular rate and rhythm, S1/S2, without murmur, rub or gallop.  There was no pedal edema.  GI:  abdomen was soft, flat, nontender, nondistended, without organomegaly.  Muscoloskeletal: still some mild tenderness to palpation of mid back. Skin exam was without echymosis, petichae. There was no skin burn from recent radiation  Neuro exam was nonfocal.  Patient was able to get on and off exam table without any assistance.  Gait was measured.  Patient was alerted and oriented.  Attention was good.   Language was appropriate.  Mood was normal without depression.  Speech was not pressured.  Thought content was not tangential.     LABORATORY/RADIOLOGY DATA:  Lab Results  Component Value Date   WBC 8.4 07/29/2012   HGB 11.1* 07/29/2012   HCT 32.0* 07/29/2012   PLT 203 07/29/2012   GLUCOSE 136* 07/29/2012   CHOL 207* 06/02/2009   TRIG 68 06/02/2009   HDL 73 06/02/2009   LDLCALC 120* 06/02/2009   ALKPHOS 85 07/02/2012   ALT 18 07/02/2012   AST 25 07/02/2012   NA 132* 07/29/2012   K 4.4 07/29/2012   CL 99 07/29/2012   CREATININE 0.8 07/29/2012   BUN 29.0* 07/29/2012   CO2 26 07/29/2012        ASSESSMENT AND PLAN:   1.  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 - She is on Decadron taper.  - She is having PT.  - She had dental clearance for Zometa to start with chemo in about 10 days.  I discussed with her potential benefit of decreasing skeletal-related event.  There are risk of fever, renal insufficiency, osteonecrosis of the jaw.  She expressed informed understanding and wished to start Zometa along with chemo.   2.  IgG kappa multiple myeloma: - I recommended to delay start  of treatment until around 08/11/2011 to allow her about 2 weeks of from end of radiation to minimize cytopenia, and also allow time for Revlimid to arrive.  - Options for chemo:  *Clinical trial Revlimid/Dexamethasone/ and an oral experimental proteosome inhibitor agent (in place of Velcade).  She is not interested in this trial given potential randomization to placebo in the setting of 13q-.    *  Off of trial:  She wanted to be treated off of clinical trial.  Off of clinical trial, treatment include   + Velcade subcutaneous injection, weekly, 3 weeks on, 1 week off.   + Dexamethasone 40mg  by mouth, once a week, including the week off of chemo.   + Revlimid oral chemo pill; daily from days 1 through 21; then 1 week off.   - We discussed potential side effects of this combination chemo which include but not limited to cytopenia, bleeding, infection, neuropathy, skin rash, blood clot, birth defect, secondary cancers, constipation, electrolyte abnormalities, fatigue, mucositis.    - Chance of response:  Upward to 70-80% with both complete and partial response.   -  How to follow response:  M-spike (on SPEP) and serum light chain should decrease with chemo.     - I prescribed the following preventative medications while on chemo:  *  Acyclovir 400mg  PO daily to prevent risk of viral reactivation.  *  Lovenox injection 40mg  subcutaneous one a  day to prevent blood clot.   - She is seeing [facility] BMT next week to see if she is a candidate for auto HSCT.   3.  Pain control:  MS contin, and gabapentin.  Pain is under relatively good control.  I advised her again on bowel regimen.  I advised her to take taper down MS Contin to only at night and take Vicodin during the day prn.   4.  Anemia:  Acute on chronic.  Slight improved.  This is due to myeloma.  There is not active bleeding.  There is no need for transfusion.   5. Moderate calorie/protein malnutrition: due to myeloma.  I hope that this will get better with chemo.  I advised her on boosts.  She will be Nutrition soon.  In the future, if she continues to lose weight, we may consider Marinol.   6.  Follow up: I referred her to chemo class.  I will her her on 08/10/2012 to answer last minute questions before starting chemo that day.   The length of time of the face-to-face encounter was 40 minutes. More than 50% of time was spent counseling and coordination of care.

## 2012-07-29 NOTE — Patient Instructions (Addendum)
1.  Diagnosis:  Multiple myeloma with 13q- (intermediate risk mutation) 2.  Treatment:  Palliative radiation. 3.  I strongly recommend chemotherapy.  Need to see [facility] first.  If you are deemed candidate for autologous BMT, then no clinical trial.  If not a candidate for BMT, then clinical trial is still an option.  4.  Off of clinical trial, treatment include  *  Velcade subcutaneous injection, weekly, 3 weeks on, 1 week off.  *  Dexamethasone 40mg  by mouth, once a week, including the week off of chemo.  *  Revlimid oral chemo pill; daily from days 1 through 21; then 1 week off.   5.  Preventative medications while on chemo:  *  Acyclovir to prevent risk of viral reactivation.  *  Lovenox injection 40mg  subcutaneous one a day to prevent blood clot.   6.  Follow up with me on Mon 1/272014 to finalize treatment option.

## 2012-07-29 NOTE — Telephone Encounter (Signed)
gv pt appt schedule for January thru March.  °

## 2012-07-29 NOTE — Progress Notes (Signed)
Pt enrolled in Revlimid [employer] program. Patient assistance application filled out by pt and forwarded to Thelma Barge in managed care dept along w/ Rx for Revlimid for prior auth.

## 2012-07-30 ENCOUNTER — Ambulatory Visit: Payer: Medicare Other

## 2012-07-30 ENCOUNTER — Inpatient Hospital Stay (HOSPITAL_COMMUNITY): Admission: RE | Admit: 2012-07-30 | Payer: Medicare Other | Source: Ambulatory Visit

## 2012-07-31 ENCOUNTER — Ambulatory Visit: Payer: Medicare Other | Admitting: Physical Therapy

## 2012-07-31 ENCOUNTER — Ambulatory Visit: Payer: Medicare Other

## 2012-08-03 ENCOUNTER — Ambulatory Visit: Payer: Medicare Other | Admitting: Nutrition

## 2012-08-03 ENCOUNTER — Ambulatory Visit: Payer: Medicare Other

## 2012-08-03 ENCOUNTER — Other Ambulatory Visit: Payer: Self-pay | Admitting: *Deleted

## 2012-08-03 DIAGNOSIS — D539 Nutritional anemia, unspecified: Secondary | ICD-10-CM

## 2012-08-03 DIAGNOSIS — R52 Pain, unspecified: Secondary | ICD-10-CM

## 2012-08-03 DIAGNOSIS — C9000 Multiple myeloma not having achieved remission: Secondary | ICD-10-CM

## 2012-08-03 MED ORDER — MORPHINE SULFATE ER 15 MG PO TBCR: 15.0000 mg | EXTENDED_RELEASE_TABLET | Freq: Two times a day (BID) | ORAL | Status: DC

## 2012-08-03 NOTE — Progress Notes (Signed)
Patient called on the telephone with questions about how to increase her oral intake. Patient states she has had continued weight loss. She has difficulty swallowing and taste alterations. She would like some education on how to increase her intake. I briefly educated patient on strategies for dealing with taste alterations and to ease swallowing difficulty. I have offered to schedule a consult time with patient. Patient would like to try the strategies first. I will provide fact sheets for patient to refer to in the meantime. I've included my contact information for questions or concerns in the future.

## 2012-08-03 NOTE — Telephone Encounter (Signed)
Patient notified that mso4 script is ready to be picked up.

## 2012-08-04 ENCOUNTER — Encounter: Payer: Self-pay | Admitting: Oncology

## 2012-08-04 ENCOUNTER — Other Ambulatory Visit: Payer: Medicare Other

## 2012-08-04 ENCOUNTER — Encounter: Payer: Self-pay | Admitting: *Deleted

## 2012-08-04 NOTE — Progress Notes (Addendum)
Faxed the papers for [employer] Patient Support to Ellis Savage [phone] and her phone number is [phone] ext [phone].  08/07/12 Called [facility] and spoke to [place].  The 25 mg Revlimid for 21 days per 28 day cycle is approved until 02/04/13. I will send the RX to Michael at [facility]. Fax # [phone] Phone #[phone].

## 2012-08-05 ENCOUNTER — Ambulatory Visit: Payer: Medicare Other | Admitting: Physical Therapy

## 2012-08-07 ENCOUNTER — Telehealth: Payer: Self-pay | Admitting: *Deleted

## 2012-08-07 ENCOUNTER — Ambulatory Visit: Payer: Medicare Other | Admitting: Physical Therapy

## 2012-08-07 NOTE — Telephone Encounter (Signed)
Due her 1st chemo on 08/10/12, but has not heard about Revlimid yet. Asking if she should still come in for the treatment? Made her aware that more than likely MD will proceed with the Velcade without the Revlimid, but will email MD to confirm. Instructed her unless she is called Monday, come to office as scheduled. Called Elizabeth in managed care to follow up on status of the Revlimid papers sent to [employer]. She reports she is faxed the script to [facility] today (she was approved).

## 2012-08-10 ENCOUNTER — Other Ambulatory Visit: Payer: Medicare Other | Admitting: Lab

## 2012-08-10 ENCOUNTER — Ambulatory Visit (HOSPITAL_BASED_OUTPATIENT_CLINIC_OR_DEPARTMENT_OTHER): Payer: Medicare Other

## 2012-08-10 ENCOUNTER — Ambulatory Visit (HOSPITAL_BASED_OUTPATIENT_CLINIC_OR_DEPARTMENT_OTHER): Payer: Medicare Other | Admitting: Oncology

## 2012-08-10 ENCOUNTER — Telehealth: Payer: Self-pay | Admitting: Oncology

## 2012-08-10 VITALS — BP 108/70 | HR 103 | Temp 97.9°F | Resp 18 | Ht 68.0 in | Wt 112.8 lb

## 2012-08-10 DIAGNOSIS — C9000 Multiple myeloma not having achieved remission: Secondary | ICD-10-CM

## 2012-08-10 DIAGNOSIS — Z5112 Encounter for antineoplastic immunotherapy: Secondary | ICD-10-CM

## 2012-08-10 DIAGNOSIS — D649 Anemia, unspecified: Secondary | ICD-10-CM

## 2012-08-10 MED ORDER — HEPARIN SOD (PORK) LOCK FLUSH 100 UNIT/ML IV SOLN
500.0000 [IU] | Freq: Once | INTRAVENOUS | Status: DC | PRN
  Filled 2012-08-10: qty 5

## 2012-08-10 MED ORDER — SODIUM CHLORIDE 0.9 % IV SOLN
Freq: Once | INTRAVENOUS | Status: AC
  Administered 2012-08-10: 15:00:00 via INTRAVENOUS

## 2012-08-10 MED ORDER — BORTEZOMIB CHEMO SQ INJECTION 3.5 MG (2.5MG/ML)
1.3000 mg/m2 | Freq: Once | INTRAMUSCULAR | Status: AC
  Administered 2012-08-10: 2 mg via SUBCUTANEOUS
  Filled 2012-08-10: qty 2

## 2012-08-10 MED ORDER — HEPARIN SOD (PORK) LOCK FLUSH 100 UNIT/ML IV SOLN
250.0000 [IU] | Freq: Once | INTRAVENOUS | Status: DC | PRN
  Filled 2012-08-10: qty 5

## 2012-08-10 MED ORDER — ONDANSETRON HCL 8 MG PO TABS
8.0000 mg | ORAL_TABLET | Freq: Once | ORAL | Status: AC
  Administered 2012-08-10: 8 mg via ORAL

## 2012-08-10 MED ORDER — LENALIDOMIDE 25 MG PO CAPS: 25.0000 mg | ORAL_CAPSULE | Freq: Every day | ORAL | Status: DC

## 2012-08-10 MED ORDER — SODIUM CHLORIDE 0.9 % IJ SOLN
10.0000 mL | INTRAMUSCULAR | Status: DC | PRN
  Filled 2012-08-10: qty 10

## 2012-08-10 MED ORDER — SODIUM CHLORIDE 0.9 % IJ SOLN
3.0000 mL | Freq: Once | INTRAMUSCULAR | Status: DC | PRN
  Filled 2012-08-10: qty 10

## 2012-08-10 MED ORDER — ZOLEDRONIC ACID 4 MG/5ML IV CONC
3.5000 mg | Freq: Once | INTRAVENOUS | Status: AC
  Administered 2012-08-10: 3.5 mg via INTRAVENOUS
  Filled 2012-08-10: qty 4.38

## 2012-08-10 NOTE — Telephone Encounter (Signed)
gv and printed appt schedule for pt for Jan and Feb..swallowing and emailed michelle regarding to tx.

## 2012-08-10 NOTE — Progress Notes (Signed)
[facility]  Telephone:(336) [phone] Fax:(336) [phone]   OFFICE PROGRESS NOTE   Cc:  Sanjuana Letters, MD  DIAGNOSIS: multiple myeloma, presented with lytic bone lesions, anemia (Hgb 9.3). Presenting IgG was 4,040 mg/dL on 45/40/9811 (IgA 35; IgM 34); kappa free light chain 34.4 mg/dL, lambda 9.14, kappa:lambda ratio of 34.75; SPEP with M-spike of 2.60.  Bone marrow biopsy on 07/03/2012 showed 49% plasma cell.  Normal classical cytogenetics; however, myeloma FISH panel showed 13q-.   PAST THERAPY:  Palliative radiation 20 Gy over 10 fractions between 07/14/2012 and 07/27/2012 to thoracic spine cord compression and symptomatic left scapula lytic lesion.   CURRENT THERAPY:  Due to start today 08/10/2012 SQ Velcade once weekly, 3 weeks on, 1 weeks off; daily Revlimid d1-21, 7 days off; and Dexamethasone 40mg  PO weekly.   INTERVAL HISTORY: Shannon Rush 72 y.o. female returns for regular follow up with her husband.  She reports doing better than 2 months ago.  Her back pain has significantly improved.  She no longer feels a band from the back radiating to the front.  She still feels at time abdominal tightness but not pain.  She has been able to ambulate short distance without a walker; she came to clinic today without walker or wheelchair.  She denied frequent fall.  The rest of the 14 point-review of system was negative.     Past Medical History  Diagnosis Date  . Multiple myeloma 07/01/2012  . Unspecified deficiency anemia   . OSTEOPOROSIS 06/11/2010    Multiple compression fractures; and spontaneous fracture of sternum Qualifier: Diagnosis of  By: Humberto Seals NP, Darl Pikes     . 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 Cord compression 07/13/12    MRI- diffuse myeloma involvement of T-L spine    Past Surgical History  Procedure Date  . Appendectomy   . Tubal ligation   . Cesarean section     x2   . Hip surgery 2009    left  . Elbow surgery   .  Colonoscopy 2007    neg with Dr. Ewing Schlein    Current Outpatient Prescriptions  Medication Sig Dispense Refill  . docusate sodium (COLACE) 100 MG capsule Take 100 mg by mouth 2 (two) times daily as needed.       Marland Kitchen morphine (MS CONTIN) 15 MG 12 hr tablet Take 1 tablet (15 mg total) by mouth 2 (two) times daily.  60 tablet  0  . ondansetron (ZOFRAN) 4 MG tablet Take 1 tablet (4 mg total) by mouth every 8 (eight) hours as needed.  30 tablet  3  . acyclovir (ZOVIRAX) 200 MG capsule Take 2 capsules (400 mg total) by mouth daily.  30 capsule  3  . dexamethasone (DECADRON) 4 MG tablet Take 10 tablets (40 mg total) by mouth once a week.  100 tablet  6  . enoxaparin (LOVENOX) 40 MG/0.4ML injection Inject 0.4 mLs (40 mg total) into the skin daily.  30 Syringe  3  . HYDROcodone-acetaminophen (VICODIN) 5-500 MG per tablet Take 1 tablet by mouth every 6 (six) hours as needed for pain.  100 tablet  3  . lenalidomide (REVLIMID) 25 MG capsule Take 1 capsule (25 mg total) by mouth daily.  21 capsule  0   No current facility-administered medications for this visit.   Facility-Administered Medications Ordered in Other Visits  Medication Dose Route Frequency Provider Last Rate Last Dose  . heparin lock flush 100  unit/mL  500 Units Intracatheter Once PRN Exie Parody, MD      . heparin lock flush 100 unit/mL  250 Units Intracatheter Once PRN Exie Parody, MD      . sodium chloride 0.9 % injection 10 mL  10 mL Intracatheter PRN Exie Parody, MD      . sodium chloride 0.9 % injection 3 mL  3 mL Intravenous Once PRN Exie Parody, MD        ALLERGIES:   has no known allergies.  REVIEW OF SYSTEMS:  The rest of the 14-point review of system was negative.   Filed Vitals:   08/10/12 1332  BP: 108/70  Pulse: 103  Temp: 97.9 F (36.6 C)  Resp: 18   Wt Readings from Last 3 Encounters:  08/10/12 112 lb 12.8 oz (51.166 kg)  07/29/12 114 lb 12.8 oz (52.073 kg)  07/24/12 118 lb (53.524 kg)   ECOG Performance status:  1  PHYSICAL EXAMINATION:   General:  thin appearing woman, in no acute distress.  Eyes:  no scleral icterus.  ENT:  There were no oropharyngeal lesions.  Neck was without thyromegaly.  Lymphatics:  Negative cervical, supraclavicular or axillary adenopathy.  Respiratory: lungs were clear bilaterally without wheezing or crackles.  Cardiovascular:  Regular rate and rhythm, S1/S2, without murmur, rub or gallop.  There was no pedal edema.  GI:  abdomen was soft, flat, nontender, nondistended, without organomegaly.  Muscoloskeletal: still some mild tenderness to palpation of mid back. Skin exam was without echymosis, petichae. There was no skin burn from recent radiation  Neuro exam was nonfocal.  Patient was able to get on and off exam table without any assistance.  Gait was measured.  Patient was alerted and oriented.  Attention was good.   Language was appropriate.  Mood was normal without depression.  Speech was not pressured.  Thought content was not tangential.     LABORATORY/RADIOLOGY DATA:  Lab Results  Component Value Date   WBC 8.4 07/29/2012   HGB 11.1* 07/29/2012   HCT 32.0* 07/29/2012   PLT 203 07/29/2012   GLUCOSE 136* 07/29/2012   CHOL 207* 06/02/2009   TRIG 68 06/02/2009   HDL 73 06/02/2009   LDLCALC 120* 06/02/2009   ALKPHOS 85 07/02/2012   ALT 18 07/02/2012   AST 25 07/02/2012   NA 132* 07/29/2012   K 4.4 07/29/2012   CL 99 07/29/2012   CREATININE 0.8 07/29/2012   BUN 29.0* 07/29/2012   CO2 26 07/29/2012       ASSESSMENT AND PLAN:   1.  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 - She is having PT.  - She had dental clearance for Zometa to start with chemo today.   2.  IgG kappa multiple myeloma: - I recommended to start chemo for her symptomatic myeloma to avoid further end-organ damage.  She visited [facility] Dr. Barbaraann Boys last week and is leaning toward autologous BMT after 4-6 cycles of chemo.  She wanted to be  treated off of clinical trial.  Off of clinical trial, treatment include   + Velcade subcutaneous injection, weekly, 3 weeks on, 1 week off.   + Dexamethasone 40mg  by mouth, once a week, including the week off of chemo.   + Revlimid oral chemo pill; daily from days 1 through 21; then 1 week off.  This will start later this week when it arrives from [facility].   - We  again went over potential side effects of this combination chemo which include but not limited to cytopenia, bleeding, infection, neuropathy, skin rash, blood clot, birth defect, secondary cancers, constipation, electrolyte abnormalities, fatigue, mucositis.   She again expressed informed understanding and wished to proceed.   - I advised her to start the following preventative medications while on chemo:  *  Acyclovir 400mg  PO daily to prevent risk of viral reactivation.  *  Lovenox injection 40mg  subcutaneous one a day to prevent blood clot.   3.  Pain control:  Just on MS Contin once daily without breakthrough pain meds.   4.  Anemia:  Acute on chronic.  Slight improved.  This is due to myeloma.  There is not active bleeding.  There is no need for transfusion.   5. Moderate calorie/protein malnutrition: due to myeloma.  I hope that this will get better with chemo.  In the future, if she continues to lose weight, we may consider Marinol.   6.  Follow up: in one week to ensure she is tolerating chemo well.   The length of time of the face-to-face encounter was 15 minutes. More than 50% of time was spent counseling and coordination of care.

## 2012-08-10 NOTE — Patient Instructions (Addendum)
[facility] Discharge Instructions for Patients Receiving Chemotherapy  Today you received the following chemotherapy agents Velcade  To help prevent nausea and vomiting after your treatment, we encourage you to take your nausea medication as directed.   If you develop nausea and vomiting that is not controlled by your nausea medication, call the clinic. If it is after clinic hours your family physician or the after hours number for the clinic or go to the Emergency Department.   BELOW ARE SYMPTOMS THAT SHOULD BE REPORTED IMMEDIATELY:  *FEVER GREATER THAN 100.5 F  *CHILLS WITH OR WITHOUT FEVER  NAUSEA AND VOMITING THAT IS NOT CONTROLLED WITH YOUR NAUSEA MEDICATION  *UNUSUAL SHORTNESS OF BREATH  *UNUSUAL BRUISING OR BLEEDING  TENDERNESS IN MOUTH AND THROAT WITH OR WITHOUT PRESENCE OF ULCERS  *URINARY PROBLEMS  *BOWEL PROBLEMS  UNUSUAL RASH Items with * indicate a potential emergency and should be followed up as soon as possible.  One of the nurses will contact you 24 hours after your treatment. Please let the nurse know about any problems that you may have experienced. Feel free to call the clinic you have any questions or concerns. The clinic phone number is [phone].   I have been informed and understand all the instructions given to me. I know to contact the clinic, my physician, or go to the Emergency Department if any problems should occur. I do not have any questions at this time, but understand that I may call the clinic during office hours   should I have any questions or need assistance in obtaining follow up care.    __________________________________________  _____________  __________ Signature of Patient or Authorized Representative            Date                   Time    __________________________________________ Nurse's Signature    

## 2012-08-11 ENCOUNTER — Other Ambulatory Visit (HOSPITAL_COMMUNITY): Payer: Medicare Other

## 2012-08-11 ENCOUNTER — Ambulatory Visit: Payer: Medicare Other | Admitting: Physical Therapy

## 2012-08-12 ENCOUNTER — Encounter: Payer: Self-pay | Admitting: Oncology

## 2012-08-12 ENCOUNTER — Telehealth: Payer: Self-pay | Admitting: *Deleted

## 2012-08-12 ENCOUNTER — Ambulatory Visit: Payer: Medicare Other | Admitting: Physical Therapy

## 2012-08-12 NOTE — Telephone Encounter (Signed)
Daughter sent fax of letter for Dr. Gaylyn Rong to write to [employer] for reimbursement of airline tickets.  Daughter was unable to travel due to pt's condition/treatment.  Dr. Gaylyn Rong completed letter and this RN faxed to daughter's work at fax#[phone] as requested.  Called her to inform of letter faxed and she said she got it.

## 2012-08-14 ENCOUNTER — Encounter: Payer: Medicare Other | Admitting: Physical Therapy

## 2012-08-17 ENCOUNTER — Encounter: Payer: Self-pay | Admitting: Oncology

## 2012-08-17 ENCOUNTER — Ambulatory Visit: Payer: Medicare Other | Admitting: Oncology

## 2012-08-17 ENCOUNTER — Ambulatory Visit (HOSPITAL_BASED_OUTPATIENT_CLINIC_OR_DEPARTMENT_OTHER): Payer: Medicare Other

## 2012-08-17 ENCOUNTER — Other Ambulatory Visit: Payer: Medicare Other | Admitting: Lab

## 2012-08-17 ENCOUNTER — Ambulatory Visit (HOSPITAL_BASED_OUTPATIENT_CLINIC_OR_DEPARTMENT_OTHER): Payer: Medicare Other | Admitting: Oncology

## 2012-08-17 ENCOUNTER — Other Ambulatory Visit (HOSPITAL_BASED_OUTPATIENT_CLINIC_OR_DEPARTMENT_OTHER): Payer: Medicare Other | Admitting: Lab

## 2012-08-17 VITALS — BP 122/68 | HR 103 | Temp 97.2°F | Resp 18 | Ht 68.0 in | Wt 118.2 lb

## 2012-08-17 DIAGNOSIS — Z5112 Encounter for antineoplastic immunotherapy: Secondary | ICD-10-CM

## 2012-08-17 DIAGNOSIS — C9000 Multiple myeloma not having achieved remission: Secondary | ICD-10-CM

## 2012-08-17 DIAGNOSIS — D630 Anemia in neoplastic disease: Secondary | ICD-10-CM

## 2012-08-17 DIAGNOSIS — G992 Myelopathy in diseases classified elsewhere: Secondary | ICD-10-CM

## 2012-08-17 DIAGNOSIS — E440 Moderate protein-calorie malnutrition: Secondary | ICD-10-CM

## 2012-08-17 LAB — COMPREHENSIVE METABOLIC PANEL (CC13)
Alkaline Phosphatase: 110 U/L (ref 40–150)
BUN: 12.6 mg/dL (ref 7.0–26.0)
Glucose: 105 mg/dl — ABNORMAL HIGH (ref 70–99)
Sodium: 131 mEq/L — ABNORMAL LOW (ref 136–145)
Total Bilirubin: 0.76 mg/dL (ref 0.20–1.20)
Total Protein: 7.3 g/dL (ref 6.4–8.3)

## 2012-08-17 LAB — CBC WITH DIFFERENTIAL/PLATELET
Basophils Absolute: 0 10*3/uL (ref 0.0–0.1)
Eosinophils Absolute: 0.1 10*3/uL (ref 0.0–0.5)
HCT: 27.7 % — ABNORMAL LOW (ref 34.8–46.6)
HGB: 9.6 g/dL — ABNORMAL LOW (ref 11.6–15.9)
LYMPH%: 5.1 % — ABNORMAL LOW (ref 14.0–49.7)
MCV: 97.5 fL (ref 79.5–101.0)
MONO%: 5.4 % (ref 0.0–14.0)
NEUT#: 4 10*3/uL (ref 1.5–6.5)
NEUT%: 87.7 % — ABNORMAL HIGH (ref 38.4–76.8)
Platelets: 175 10*3/uL (ref 145–400)

## 2012-08-17 MED ORDER — ONDANSETRON HCL 8 MG PO TABS
8.0000 mg | ORAL_TABLET | Freq: Once | ORAL | Status: AC
  Administered 2012-08-17: 8 mg via ORAL

## 2012-08-17 MED ORDER — BORTEZOMIB CHEMO SQ INJECTION 3.5 MG (2.5MG/ML)
1.3000 mg/m2 | Freq: Once | INTRAMUSCULAR | Status: AC
  Administered 2012-08-17: 2 mg via SUBCUTANEOUS
  Filled 2012-08-17: qty 0.8

## 2012-08-17 NOTE — Progress Notes (Signed)
[facility]  Telephone:(336) [phone] Fax:(336) [phone]   OFFICE PROGRESS NOTE   Cc:  Sanjuana Letters, MD  DIAGNOSIS: multiple myeloma, presented with lytic bone lesions, anemia (Hgb 9.3). Presenting IgG was 4,040 mg/dL on 95/62/1308 (IgA 35; IgM 34); kappa free light chain 34.4 mg/dL, lambda 6.57, kappa:lambda ratio of 34.75; SPEP with M-spike of 2.60.  Bone marrow biopsy on 07/03/2012 showed 49% plasma cell.  Normal classical cytogenetics; however, myeloma FISH panel showed 13q-.   PAST THERAPY:  Palliative radiation 20 Gy over 10 fractions between 07/14/2012 and 07/27/2012 to thoracic spine cord compression and symptomatic left scapula lytic lesion.   CURRENT THERAPY:  Started SQ Velcade once weekly, 3 weeks on, 1 weeks off; daily Revlimid d1-21, 7 days off; and Dexamethasone 40mg  PO weekly on 08/10/12.   INTERVAL HISTORY: Shannon Rush 72 y.o. female returns for regular follow up with her husband.  Started chemotherapy last week and tolerated well. Has mild fatigue, but remains independent with ADLs. Her back pain has significantly improved.  She no longer feels a band from the back radiating to the front.  Using MS Contin daily. No breakthrough pain medication use. She has been able to ambulate short distance without a walker; she came to clinic today without walker or wheelchair.  She denied frequent falls.  No edema. No neuropathy. The rest of the 14 point-review of system was negative.     Past Medical History  Diagnosis Date  . Multiple myeloma 07/01/2012  . Unspecified deficiency anemia   . OSTEOPOROSIS 06/11/2010    Multiple compression fractures; and spontaneous fracture of sternum Qualifier: Diagnosis of  By: Humberto Seals NP, Darl Pikes     . 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 Cord compression 07/13/12    MRI- diffuse myeloma involvement of T-L spine    Past Surgical History  Procedure Date  . Appendectomy   . Tubal ligation   . Cesarean  section     x2   . Hip surgery 2009    left  . Elbow surgery   . Colonoscopy 2007    neg with Dr. Ewing Schlein    Current Outpatient Prescriptions  Medication Sig Dispense Refill  . acyclovir (ZOVIRAX) 200 MG capsule Take 2 capsules (400 mg total) by mouth daily.  30 capsule  3  . dexamethasone (DECADRON) 4 MG tablet Take 10 tablets (40 mg total) by mouth once a week.  100 tablet  6  . docusate sodium (COLACE) 100 MG capsule Take 100 mg by mouth 2 (two) times daily as needed.       . enoxaparin (LOVENOX) 40 MG/0.4ML injection Inject 0.4 mLs (40 mg total) into the skin daily.  30 Syringe  3  . HYDROcodone-acetaminophen (VICODIN) 5-500 MG per tablet Take 1 tablet by mouth every 6 (six) hours as needed for pain.  100 tablet  3  . lenalidomide (REVLIMID) 25 MG capsule Take 1 capsule (25 mg total) by mouth daily.  21 capsule  0  . morphine (MS CONTIN) 15 MG 12 hr tablet Take 1 tablet (15 mg total) by mouth 2 (two) times daily.  60 tablet  0  . ondansetron (ZOFRAN) 4 MG tablet Take 1 tablet (4 mg total) by mouth every 8 (eight) hours as needed.  30 tablet  3   No current facility-administered medications for this visit.   Facility-Administered Medications Ordered in Other Visits  Medication Dose Route Frequency Provider Last Rate Last Dose  .  bortezomib SQ (VELCADE) chemo injection 2 mg  1.3 mg/m2 (Treatment Plan Actual) Subcutaneous Once Exie Parody, MD        ALLERGIES:   has no known allergies.  REVIEW OF SYSTEMS:  The rest of the 14-point review of system was negative.   Filed Vitals:   08/17/12 1518  BP: 122/68  Pulse: 103  Temp: 97.2 F (36.2 C)  Resp: 18   Wt Readings from Last 3 Encounters:  08/17/12 118 lb 3.2 oz (53.615 kg)  08/10/12 112 lb 12.8 oz (51.166 kg)  07/29/12 114 lb 12.8 oz (52.073 kg)   ECOG Performance status: 1  PHYSICAL EXAMINATION:   General:  thin appearing woman, in no acute distress.  Eyes:  no scleral icterus.  ENT:  There were no oropharyngeal lesions.   Neck was without thyromegaly.  Lymphatics:  Negative cervical, supraclavicular or axillary adenopathy.  Respiratory: lungs were clear bilaterally without wheezing or crackles.  Cardiovascular:  Regular rate and rhythm, S1/S2, without murmur, rub or gallop.  There was no pedal edema.  GI:  abdomen was soft, flat, nontender, nondistended, without organomegaly.  Muscoloskeletal: still some mild tenderness to palpation of mid back. Skin exam was without echymosis, petichae. Neuro exam was nonfocal.  Patient was able to get on and off exam table without any assistance.  Gait was measured.  Patient was alerted and oriented.  Attention was good.   Language was appropriate.  Mood was normal without depression.  Speech was not pressured.  Thought content was not tangential.     LABORATORY/RADIOLOGY DATA:  Lab Results  Component Value Date   WBC 4.5 08/17/2012   HGB 9.6* 08/17/2012   HCT 27.7* 08/17/2012   PLT 175 08/17/2012   GLUCOSE 105* 08/17/2012   CHOL 207* 06/02/2009   TRIG 68 06/02/2009   HDL 73 06/02/2009   LDLCALC 120* 06/02/2009   ALKPHOS 110 08/17/2012   ALT 40 08/17/2012   AST 33 08/17/2012   NA 131* 08/17/2012   K 3.7 08/17/2012   CL 100 08/17/2012   CREATININE 0.7 08/17/2012   BUN 12.6 08/17/2012   CO2 23 08/17/2012       ASSESSMENT AND PLAN:   1.  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 - She is having PT.  - Receiving Zometa monthly.   2.  IgG kappa multiple myeloma: - On Velcade, Dexamethasone, Revlimid and tolerating well. She has grade 1 fatigue. Recommend that she continue chemotherapy without dose modification. She visited [facility] Dr. Barbaraann Boys last week and is leaning toward autologous BMT after 4-6 cycles of chemo. - Preventative medications while on chemo:  *  Acyclovir 400mg  PO daily to prevent risk of viral reactivation.  *  Lovenox injection 40mg  subcutaneous one a day to prevent blood clot.   3.  Pain control:  Just on MS  Contin once daily without breakthrough pain meds.   4.  Anemia:  Acute on chronic. This is due to myeloma.  There is not active bleeding.  There is no need for transfusion.   5. Moderate calorie/protein malnutrition: due to myeloma. Appetite improving with weight gain.  6.  Follow up: in one week to ensure she is tolerating chemo well.   The length of time of the face-to-face encounter was 15 minutes. More than 50% of time was spent counseling and coordination of care.

## 2012-08-17 NOTE — Patient Instructions (Addendum)
[facility] Discharge Instructions for Patients Receiving Chemotherapy  Today you received the following chemotherapy agents Velcade.  To help prevent nausea and vomiting after your treatment, we encourage you to take your nausea medication as prescribed.   If you develop nausea and vomiting that is not controlled by your nausea medication, call the clinic. If it is after clinic hours your family physician or the after hours number for the clinic or go to the Emergency Department.   BELOW ARE SYMPTOMS THAT SHOULD BE REPORTED IMMEDIATELY:  *FEVER GREATER THAN 100.5 F  *CHILLS WITH OR WITHOUT FEVER  NAUSEA AND VOMITING THAT IS NOT CONTROLLED WITH YOUR NAUSEA MEDICATION  *UNUSUAL SHORTNESS OF BREATH  *UNUSUAL BRUISING OR BLEEDING  TENDERNESS IN MOUTH AND THROAT WITH OR WITHOUT PRESENCE OF ULCERS  *URINARY PROBLEMS  *BOWEL PROBLEMS  UNUSUAL RASH Items with * indicate a potential emergency and should be followed up as soon as possible.  Feel free to call the clinic you have any questions or concerns. The clinic phone number is [phone].   I have been informed and understand all the instructions given to me. I know to contact the clinic, my physician, or go to the Emergency Department if any problems should occur. I do not have any questions at this time, but understand that I may call the clinic during office hours   should I have any questions or need assistance in obtaining follow up care.    __________________________________________  _____________  __________ Signature of Patient or Authorized Representative            Date                   Time    __________________________________________ Nurse's Signature    

## 2012-08-19 ENCOUNTER — Telehealth: Payer: Self-pay | Admitting: *Deleted

## 2012-08-19 MED ORDER — DIPHENOXYLATE-ATROPINE 2.5-0.025 MG PO TABS: 2.0000 | ORAL_TABLET | Freq: Four times a day (QID) | ORAL | Status: DC | PRN

## 2012-08-19 NOTE — Telephone Encounter (Signed)
Pt reports started having loose watery stools yesterday morning.  (Started on Revlimid 25 mg 08/11/12).   She spoke w/ on call MD last night and was instructed to take imodium prn.  Pt has taken 6 pills since 7 pm last night and the diarrhea is slowing down some.  Now she is only having stools after eating anything. Denies fever or any other symptoms. She asks if there is anything else she should be doing? Instructed pt to push PO fluids to prevent dehydration.  Do not take more than 8 imodium in 24 hr period.  Will report to Dr. Gaylyn Rong and call pt back.  She verbalized understanding.

## 2012-08-19 NOTE — Telephone Encounter (Signed)
Called Lomotil to [facility].  Instructed pt to take lomotil QID prn if diarrhea continues.  Push po fluids and avoid dairy products.  Pt states she will take 2 more imodium today if needed and then get lomotil if diarrhea does not resolves.  Encouraged pt to get lomotil filled to have on hand in case she needs it.  Instructed to call us if diarrhea does not resolve or gets worse.  She verbalized understanding.

## 2012-08-19 NOTE — Telephone Encounter (Signed)
Please call in Lomotil in case Imodium does not work.  Please advise her to refrain from all dairy products for now until diarrhea improves.  Thanks.

## 2012-08-20 ENCOUNTER — Telehealth: Payer: Self-pay | Admitting: Oncology

## 2012-08-20 ENCOUNTER — Telehealth: Payer: Self-pay | Admitting: *Deleted

## 2012-08-20 ENCOUNTER — Ambulatory Visit (HOSPITAL_COMMUNITY)
Admission: RE | Admit: 2012-08-20 | Discharge: 2012-08-20 | Disposition: A | Discharge status: Home / Self Care | Payer: Medicare Other | Source: Ambulatory Visit | Attending: Oncology | Admitting: Oncology

## 2012-08-20 ENCOUNTER — Ambulatory Visit (HOSPITAL_BASED_OUTPATIENT_CLINIC_OR_DEPARTMENT_OTHER): Payer: Medicare Other | Admitting: Oncology

## 2012-08-20 ENCOUNTER — Other Ambulatory Visit (HOSPITAL_BASED_OUTPATIENT_CLINIC_OR_DEPARTMENT_OTHER): Payer: Medicare Other

## 2012-08-20 ENCOUNTER — Other Ambulatory Visit: Payer: Self-pay | Admitting: Oncology

## 2012-08-20 VITALS — BP 120/77 | HR 91 | Temp 98.0°F | Resp 18 | Ht 68.0 in | Wt 118.2 lb

## 2012-08-20 DIAGNOSIS — R5383 Other fatigue: Secondary | ICD-10-CM

## 2012-08-20 DIAGNOSIS — C9000 Multiple myeloma not having achieved remission: Secondary | ICD-10-CM

## 2012-08-20 DIAGNOSIS — R05 Cough: Secondary | ICD-10-CM | POA: Insufficient documentation

## 2012-08-20 DIAGNOSIS — D649 Anemia, unspecified: Secondary | ICD-10-CM

## 2012-08-20 DIAGNOSIS — M949 Disorder of cartilage, unspecified: Secondary | ICD-10-CM | POA: Insufficient documentation

## 2012-08-20 DIAGNOSIS — R059 Cough, unspecified: Secondary | ICD-10-CM | POA: Insufficient documentation

## 2012-08-20 DIAGNOSIS — M899 Disorder of bone, unspecified: Secondary | ICD-10-CM | POA: Insufficient documentation

## 2012-08-20 DIAGNOSIS — R509 Fever, unspecified: Secondary | ICD-10-CM

## 2012-08-20 DIAGNOSIS — R5381 Other malaise: Secondary | ICD-10-CM

## 2012-08-20 LAB — CBC WITH DIFFERENTIAL/PLATELET
BASO%: 0.1 % (ref 0.0–2.0)
HCT: 25.9 % — ABNORMAL LOW (ref 34.8–46.6)
LYMPH%: 2.5 % — ABNORMAL LOW (ref 14.0–49.7)
MCH: 34.6 pg — ABNORMAL HIGH (ref 25.1–34.0)
MCHC: 35.2 g/dL (ref 31.5–36.0)
MCV: 98.3 fL (ref 79.5–101.0)
MONO#: 0.3 10*3/uL (ref 0.1–0.9)
MONO%: 5.2 % (ref 0.0–14.0)
NEUT%: 89.9 % — ABNORMAL HIGH (ref 38.4–76.8)
Platelets: 199 10*3/uL (ref 145–400)
WBC: 5.1 10*3/uL (ref 3.9–10.3)

## 2012-08-20 LAB — URINALYSIS, MICROSCOPIC - CHCC
Ketones: NEGATIVE mg/dL
Protein: <30 mg/dL
Urobilinogen, UR: 0.2 mg/dL (ref 0.2–1)
pH: 6.5 (ref 4.6–8.0)

## 2012-08-20 LAB — BASIC METABOLIC PANEL (CC13)
BUN: 10.5 mg/dL (ref 7.0–26.0)
CO2: 24 mEq/L (ref 22–29)
Chloride: 100 mEq/L (ref 98–107)
Potassium: 3.2 mEq/L — ABNORMAL LOW (ref 3.5–5.1)

## 2012-08-20 NOTE — Telephone Encounter (Signed)
Please advise patient to hold Revlimid chemo for now and get PA/lat CXR today.  GO ahead with CBC and BMET test today.  Thanks.

## 2012-08-20 NOTE — Telephone Encounter (Signed)
lvm for pt regarding to todays appt....per 2.6.14 pof

## 2012-08-20 NOTE — Telephone Encounter (Signed)
Per Dr. Gaylyn Rong,  He will also see pt today after she has CXR and labs done.  Called pt and instructed to go to [facility] for CXR then come to [facility] for lab and office visit.  She agreed,  States her husband can bring her w/i the next 30 minutes.

## 2012-08-20 NOTE — Telephone Encounter (Signed)
Pt c/o feeling "crappy" and fatigued with temp of 100.5 F, chills, non productive cough and mild sob w/ exertion.  States diarrhea resolved yesterday w/o having to take any lomotil.

## 2012-08-20 NOTE — Progress Notes (Signed)
[facility]  Telephone:(336) [phone] Fax:(336) [phone]   OFFICE PROGRESS NOTE   Cc:  Sanjuana Letters, MD  DIAGNOSIS: multiple myeloma, presented with lytic bone lesions, anemia (Hgb 9.3). Presenting IgG was 4,040 mg/dL on 45/40/9811 (IgA 35; IgM 34); kappa free light chain 34.4 mg/dL, lambda 9.14, kappa:lambda ratio of 34.75; SPEP with M-spike of 2.60.  Bone marrow biopsy on 07/03/2012 showed 49% plasma cell.  Normal classical cytogenetics; however, myeloma FISH panel showed 13q-.   PAST THERAPY:  Palliative radiation 20 Gy over 10 fractions between 07/14/2012 and 07/27/2012 to thoracic spine cord compression and symptomatic left scapula lytic lesion.   CURRENT THERAPY:  Started on1/27/2014 SQ Velcade once weekly, 3 weeks on, 1 weeks off; daily Revlimid d1-21, 7 days off; and Dexamethasone 40mg  PO weekly.   INTERVAL HISTORY: Shannon Rush 72 y.o. female returns for regular follow up with her husband for a walk in appointment.  She developed diarrhea on Tue 08/18/12 and had about 6 bouts of watery diarrhea.  She took Imodium and has not had recurrent diarrhea since.  She developed fever to 100.81F today.  She has nonproductive cough without hemoptysis.  She feels achy and very tired.  She does not feel like doing anything.  She has low appetite; however, she is trying her best to stay hydrated.  She has right posterior chest wall pain.  This pain is mild and resolves with MS Contin.  She denied relation of this pain with activity, coughing, or skin rash around the pain area.   Patient denies visual changes, confusion, drenching night sweats, palpable lymph node swelling, mucositis, odynophagia, dysphagia, nausea vomiting, jaundice, chest pain, palpitation, productive cough, gum bleeding, epistaxis, hematemesis, hemoptysis, abdominal pain, abdominal swelling, early satiety, melena, hematochezia, hematuria, skin rash, spontaneous bleeding, joint swelling, joint pain, heat  or cold intolerance, bowel bladder incontinence, focal motor weakness, paresthesia.      Past Medical History  Diagnosis Date  . Multiple myeloma 07/01/2012  . Unspecified deficiency anemia   . OSTEOPOROSIS 06/11/2010    Multiple compression fractures; and spontaneous fracture of sternum Qualifier: Diagnosis of  By: Humberto Seals NP, Darl Pikes     . 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 Cord compression 07/13/12    MRI- diffuse myeloma involvement of T-L spine    Past Surgical History  Procedure Date  . Appendectomy   . Tubal ligation   . Cesarean section     x2   . Hip surgery 2009    left  . Elbow surgery   . Colonoscopy 2007    neg with Dr. Ewing Schlein    Current Outpatient Prescriptions  Medication Sig Dispense Refill  . acyclovir (ZOVIRAX) 200 MG capsule Take 2 capsules (400 mg total) by mouth daily.  30 capsule  3  . dexamethasone (DECADRON) 4 MG tablet Take 10 tablets (40 mg total) by mouth once a week.  100 tablet  6  . enoxaparin (LOVENOX) 40 MG/0.4ML injection Inject 0.4 mLs (40 mg total) into the skin daily.  30 Syringe  3  . HYDROcodone-acetaminophen (VICODIN) 5-500 MG per tablet Take 1 tablet by mouth every 6 (six) hours as needed for pain.  100 tablet  3  . lenalidomide (REVLIMID) 25 MG capsule Take 1 capsule (25 mg total) by mouth daily.  21 capsule  0  . morphine (MS CONTIN) 15 MG 12 hr tablet Take 1 tablet (15 mg total) by mouth 2 (two) times daily.  60 tablet  0  . diphenoxylate-atropine (LOMOTIL) 2.5-0.025 MG per tablet Take 2 tablets by mouth 4 (four) times daily as needed for diarrhea or loose stools.  60 tablet  0  . docusate sodium (COLACE) 100 MG capsule Take 100 mg by mouth 2 (two) times daily as needed.       . ondansetron (ZOFRAN) 4 MG tablet Take 1 tablet (4 mg total) by mouth every 8 (eight) hours as needed.  30 tablet  3    ALLERGIES:   has no known allergies.  REVIEW OF SYSTEMS:  The rest of the 14-point review of system was negative.   Filed  Vitals:   08/20/12 1147  BP: 120/77  Pulse: 91  Temp: 98 F (36.7 C)  Resp: 18   Wt Readings from Last 3 Encounters:  08/20/12 118 lb 3.2 oz (53.615 kg)  08/17/12 118 lb 3.2 oz (53.615 kg)  08/10/12 112 lb 12.8 oz (51.166 kg)   ECOG Performance status: 1  PHYSICAL EXAMINATION:   General:  thin appearing woman, tired appearing, in no acute distress.  Eyes:  no scleral icterus.  ENT:  There were no oropharyngeal lesions.  Neck was without thyromegaly.  Lymphatics:  Negative cervical, supraclavicular or axillary adenopathy.  Respiratory: lungs were clear bilaterally without wheezing or crackles.  Cardiovascular:  Regular rate and rhythm, S1/S2, without murmur, rub or gallop.  There was no pedal edema.  GI:  abdomen was soft, flat, nontender, nondistended, without organomegaly.  Muscoloskeletal: palpation and percussion of right upper back did not result in any pain.  Skin exam was without echymosis, petichae. There was no skin burn from recent radiation  Neuro exam was nonfocal.  Patient was able to get on and off exam table without any assistance.  Gait was measured.  Patient was alerted and oriented.  Attention was good.   Language was appropriate.  Mood was normal without depression.  Speech was not pressured.  Thought content was not tangential.     LABORATORY/RADIOLOGY DATA:  Lab Results  Component Value Date   WBC 5.1 08/20/2012   HGB 9.1* 08/20/2012   HCT 25.9* 08/20/2012   PLT 199 08/20/2012   GLUCOSE 104* 08/20/2012   CHOL 207* 06/02/2009   TRIG 68 06/02/2009   HDL 73 06/02/2009   LDLCALC 120* 06/02/2009   ALKPHOS 110 08/17/2012   ALT 40 08/17/2012   AST 33 08/17/2012   NA 130* 08/20/2012   K 3.2* 08/20/2012   CL 100 08/20/2012   CREATININE 0.7 08/20/2012   BUN 10.5 08/20/2012   CO2 24 08/20/2012    IMAGING:  I personally reviewed and showed the patient and her husband the CXR today.   Findings: Moderate thoracic kyphosis is noted secondary to old  compression fractures in mid thoracic  spine. Cardiomediastinal  silhouette appears normal. Old left rib fractures are noted. No  acute pulmonary disease is noted. There is interval development of  a probable pathologic fracture involving the mid shaft of the left  clavicle.  IMPRESSION:  No acute pulmonary disease is noted. Interval development of  destructive lesion involving the midshaft of the left clavicle  consistent with history of multiple myeloma.    ASSESSMENT AND PLAN:   1.  Fever, fatigue:  Most likely due to tumor fever.  She has no obvious source.  CXR was negative.  UA was not convincingly positive.  I sent urine and blood culture.  She is not neutropenic. There is no strong indication for empiric antibiotics.  2.  IgG kappa multiple myeloma: - I recommended her to hold off on Revlimid until next week.  When she improves, we will resume at lower dose.  - I advised her to start the continue the following preventative medications   *  Acyclovir 400mg  PO daily to prevent risk of viral reactivation.  *  Lovenox injection 40mg  subcutaneous one a day to prevent blood clot.   3.  Pain control:  Just on MS Contin once daily without breakthrough pain meds.   4.  Anemia:  Slightly worsened due to treatment.  There is not active bleeding.  There is no need for transfusion.   6.  Follow up: early next week to decide what to do with her Revlimid.

## 2012-08-21 ENCOUNTER — Other Ambulatory Visit: Payer: Self-pay | Admitting: Oncology

## 2012-08-24 ENCOUNTER — Ambulatory Visit (HOSPITAL_BASED_OUTPATIENT_CLINIC_OR_DEPARTMENT_OTHER): Payer: Medicare Other

## 2012-08-24 ENCOUNTER — Telehealth: Payer: Self-pay | Admitting: *Deleted

## 2012-08-24 ENCOUNTER — Other Ambulatory Visit (HOSPITAL_BASED_OUTPATIENT_CLINIC_OR_DEPARTMENT_OTHER): Payer: Medicare Other

## 2012-08-24 ENCOUNTER — Ambulatory Visit (HOSPITAL_BASED_OUTPATIENT_CLINIC_OR_DEPARTMENT_OTHER): Payer: Medicare Other | Admitting: Oncology

## 2012-08-24 ENCOUNTER — Telehealth: Payer: Self-pay | Admitting: Oncology

## 2012-08-24 VITALS — BP 103/64 | HR 97 | Temp 96.9°F | Resp 20 | Ht 68.0 in | Wt 122.9 lb

## 2012-08-24 DIAGNOSIS — C9000 Multiple myeloma not having achieved remission: Secondary | ICD-10-CM

## 2012-08-24 DIAGNOSIS — Z5112 Encounter for antineoplastic immunotherapy: Secondary | ICD-10-CM

## 2012-08-24 DIAGNOSIS — D649 Anemia, unspecified: Secondary | ICD-10-CM

## 2012-08-24 DIAGNOSIS — R609 Edema, unspecified: Secondary | ICD-10-CM

## 2012-08-24 LAB — CBC WITH DIFFERENTIAL/PLATELET
Basophils Absolute: 0 10*3/uL (ref 0.0–0.1)
Eosinophils Absolute: 0.1 10*3/uL (ref 0.0–0.5)
HGB: 9 g/dL — ABNORMAL LOW (ref 11.6–15.9)
LYMPH%: 6 % — ABNORMAL LOW (ref 14.0–49.7)
MCV: 97 fL (ref 79.5–101.0)
MONO%: 12.6 % (ref 0.0–14.0)
NEUT#: 2.9 10*3/uL (ref 1.5–6.5)
Platelets: 221 10*3/uL (ref 145–400)
RBC: 2.68 10*6/uL — ABNORMAL LOW (ref 3.70–5.45)

## 2012-08-24 LAB — BASIC METABOLIC PANEL (CC13)
BUN: 10.9 mg/dL (ref 7.0–26.0)
Calcium: 8.1 mg/dL — ABNORMAL LOW (ref 8.4–10.4)
Glucose: 108 mg/dl — ABNORMAL HIGH (ref 70–99)

## 2012-08-24 MED ORDER — BORTEZOMIB CHEMO SQ INJECTION 3.5 MG (2.5MG/ML)
1.1700 mg/m2 | Freq: Once | INTRAMUSCULAR | Status: AC
  Administered 2012-08-24: 1.75 mg via SUBCUTANEOUS
  Filled 2012-08-24: qty 1.75

## 2012-08-24 MED ORDER — ONDANSETRON HCL 8 MG PO TABS
8.0000 mg | ORAL_TABLET | Freq: Once | ORAL | Status: AC
  Administered 2012-08-24: 8 mg via ORAL

## 2012-08-24 NOTE — Telephone Encounter (Signed)
Gave pt appt for February and March 2014 lab, MD and chemo

## 2012-08-24 NOTE — Progress Notes (Signed)
[facility]  Telephone:(336) [phone] Fax:(336) [phone]   OFFICE PROGRESS NOTE   Cc:  Sanjuana Letters, MD  DIAGNOSIS: multiple myeloma, presented with lytic bone lesions, anemia (Hgb 9.3). Presenting IgG was 4,040 mg/dL on 19/14/7829 (IgA 35; IgM 34); kappa free light chain 34.4 mg/dL, lambda 5.62, kappa:lambda ratio of 34.75; SPEP with M-spike of 2.60.  Bone marrow biopsy on 07/03/2012 showed 49% plasma cell.  Normal classical cytogenetics; however, myeloma FISH panel showed 13q-.   PAST THERAPY:  Palliative radiation 20 Gy over 10 fractions between 07/14/2012 and 07/27/2012 to thoracic spine cord compression and symptomatic left scapula lytic lesion.   CURRENT THERAPY:  Started on1/27/2014 SQ Velcade once weekly, 3 weeks on, 1 weeks off; daily Revlimid d1-21, 7 days off; and Dexamethasone 40mg  PO weekly.   INTERVAL HISTORY: Shannon Rush 72 y.o. female returns for regular follow up with her husband for cycle #1, day #15 of chemo Velcade.  She has been holding off of Revlimid because of severe fatigue last week.  Her diarrhea has completely resolved.  She no longer has low grade fever.  Her fatigue has improved.  However, it is still difficult for her to get out and active.  She has fatigue and DOE walking about 50 feet.  She is still independent of light activities of daily living; however, she has no drive to do much.  She is still able to tutor ESL.  She has low appetite but has gained a few pounds.  She has bilateral pedal edema but no PND, orthopnea, chest pain, abdominal swelling, ascites.  She still has a band of discomfort around her lower back.  It is mild; worsened with sudden body motion; no radiation; better than last month; no bowel/bladder incontinence; no leg weakness or paresthesia.   Patient denies fever, headache, visual changes, confusion, drenching night sweats, palpable lymph node swelling, mucositis, odynophagia, dysphagia, nausea vomiting,  jaundice, productive cough, gum bleeding, epistaxis, hematemesis, hemoptysis, abdominal pain, abdominal swelling, early satiety, melena, hematochezia, hematuria, skin rash, spontaneous bleeding, heat or cold intolerance, bowel bladder incontinence, depression.      Past Medical History  Diagnosis Date  . Multiple myeloma 07/01/2012  . Unspecified deficiency anemia   . OSTEOPOROSIS 06/11/2010    Multiple compression fractures; and spontaneous fracture of sternum Qualifier: Diagnosis of  By: Humberto Seals NP, Darl Pikes     . 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 Cord compression 07/13/12    MRI- diffuse myeloma involvement of T-L spine    Past Surgical History  Procedure Laterality Date  . Appendectomy    . Tubal ligation    . Cesarean section      x2   . Hip surgery  2009    left  . Elbow surgery    . Colonoscopy  2007    neg with Dr. Ewing Schlein    Current Outpatient Prescriptions  Medication Sig Dispense Refill  . acyclovir (ZOVIRAX) 200 MG capsule Take 2 capsules (400 mg total) by mouth daily.  30 capsule  3  . dexamethasone (DECADRON) 4 MG tablet Take 10 tablets (40 mg total) by mouth once a week.  100 tablet  6  . diphenoxylate-atropine (LOMOTIL) 2.5-0.025 MG per tablet Take 2 tablets by mouth 4 (four) times daily as needed for diarrhea or loose stools.  60 tablet  0  . docusate sodium (COLACE) 100 MG capsule Take 100 mg by mouth 2 (two) times daily as  needed.       . enoxaparin (LOVENOX) 40 MG/0.4ML injection Inject 0.4 mLs (40 mg total) into the skin daily.  30 Syringe  3  . HYDROcodone-acetaminophen (VICODIN) 5-500 MG per tablet Take 1 tablet by mouth every 6 (six) hours as needed for pain.  100 tablet  3  . lenalidomide (REVLIMID) 25 MG capsule Take 1 capsule (25 mg total) by mouth daily.  21 capsule  0  . morphine (MS CONTIN) 15 MG 12 hr tablet Take 1 tablet (15 mg total) by mouth 2 (two) times daily.  60 tablet  0  . ondansetron (ZOFRAN) 4 MG tablet Take 1 tablet (4 mg total)  by mouth every 8 (eight) hours as needed.  30 tablet  3   No current facility-administered medications for this visit.    ALLERGIES:  has No Known Allergies.  REVIEW OF SYSTEMS:  The rest of the 14-point review of system was negative.   Filed Vitals:   08/24/12 1423  BP: 103/64  Pulse: 97  Temp: 96.9 F (36.1 C)  Resp: 20   Wt Readings from Last 3 Encounters:  08/24/12 122 lb 14.4 oz (55.747 kg)  08/20/12 118 lb 3.2 oz (53.615 kg)  08/17/12 118 lb 3.2 oz (53.615 kg)   ECOG Performance status: 1  PHYSICAL EXAMINATION:   General:  thin appearing woman, less tired appearing than last week, in no acute distress.  Eyes:  no scleral icterus.  ENT:  There were no oropharyngeal lesions.  Neck was without thyromegaly.  Lymphatics:  Negative cervical, supraclavicular or axillary adenopathy.  Respiratory: lungs were clear bilaterally without wheezing or crackles.  Cardiovascular:  Regular rate and rhythm, S1/S2, without murmur, rub or gallop.  There was 1-2+ pidding pedal edema without calf/thigh pain or palpable cords.  GI:  abdomen was soft, flat, nontender, nondistended, without organomegaly.  Muscoloskeletal: palpation and percussion of right upper back did not result in any pain.  Skin exam was without echymosis, petichae. Neuro exam was nonfocal.  Patient was able to get on and off exam table without any assistance.  Gait was measured.  Patient was alerted and oriented.  Attention was good.   Language was appropriate.  Mood was normal without depression.  Speech was not pressured.  Thought content was not tangential.     LABORATORY/RADIOLOGY DATA:  Lab Results  Component Value Date   WBC 3.7* 08/24/2012   HGB 9.0* 08/24/2012   HCT 26.0* 08/24/2012   PLT 221 08/24/2012   GLUCOSE 108* 08/24/2012   CHOL 207* 06/02/2009   TRIG 68 06/02/2009   HDL 73 06/02/2009   LDLCALC 120* 06/02/2009   ALKPHOS 110 08/17/2012   ALT 40 08/17/2012   AST 33 08/17/2012   NA 131* 08/24/2012   K 3.8 08/24/2012    CL 98 08/24/2012   CREATININE 0.6 08/24/2012   BUN 10.9 08/24/2012   CO2 27 08/24/2012    ASSESSMENT AND PLAN:   1.  IgG kappa multiple myeloma: - She is s/p cycle #1 days 1 and 8.  She had grade 2-3 fatigue; grade 2 anemia; grade 2 pedal edema,  grade 2 diarrhea (which has resolved).  These are all most likely due to chemo combo regimen.  - I advised her to proceed with cycle #, day #15 of chemo.  I recommended decreasing the dose of Velcade by 10%.  With regard to Revlimid, the options include holding the rest of the Revlimid this cycle versus 25mg  PO q48 hours.  Of  course, I would not continue at the same dose of 25mg  PO daily due to grade 2-3 fatigue.  She is concerned about holding the dose completely.  She preferred to resume Revlimid at 25mg  PO every other days.  With next cycle, we will change dose to 15mg  PO daily.  I advised her to decrease dexamethasone to 20mg  PO qweek (from 40mg ) to attenuate the pedal edema.  - I advised her to continue the continue the following preventative medications   *  Acyclovir 400mg  PO daily to prevent risk of viral reactivation.  *  Lovenox injection 40mg  subcutaneous one a day to prevent blood clot.   3.  Pain control:  Just on MS Contin once daily without breakthrough pain meds.   4.  Anemia:  Slightly worsened due to treatment.  There is not active bleeding.  There is no need for transfusion today.  I will check her CBC next week during the week off to see if she needs transfusion.  5.  Pedal edema:  Due to dexamethasone.  There was low clinical suspicion for DVT.  If despite dose reduction of dexamethasone, and her pedal edema worsens, I may consider Lasix.   6.  Follow up: lab only appointment next week.  Return visit in about 2 weeks for cycle #2, day #1 of chemo Velcade and Zometa.

## 2012-08-24 NOTE — Patient Instructions (Signed)
[facility] Discharge Instructions for Patients Receiving Chemotherapy  Today you received the following chemotherapy agents Velcade.  To help prevent nausea and vomiting after your treatment, we encourage you to take your nausea medication as prescribed.   If you develop nausea and vomiting that is not controlled by your nausea medication, call the clinic. If it is after clinic hours your family physician or the after hours number for the clinic or go to the Emergency Department.   BELOW ARE SYMPTOMS THAT SHOULD BE REPORTED IMMEDIATELY:  *FEVER GREATER THAN 100.5 F  *CHILLS WITH OR WITHOUT FEVER  NAUSEA AND VOMITING THAT IS NOT CONTROLLED WITH YOUR NAUSEA MEDICATION  *UNUSUAL SHORTNESS OF BREATH  *UNUSUAL BRUISING OR BLEEDING  TENDERNESS IN MOUTH AND THROAT WITH OR WITHOUT PRESENCE OF ULCERS  *URINARY PROBLEMS  *BOWEL PROBLEMS  UNUSUAL RASH Items with * indicate a potential emergency and should be followed up as soon as possible.  One of the nurses will contact you 24 hours after your treatment. Please let the nurse know about any problems that you may have experienced. Feel free to call the clinic you have any questions or concerns. The clinic phone number is [phone].   I have been informed and understand all the instructions given to me. I know to contact the clinic, my physician, or go to the Emergency Department if any problems should occur. I do not have any questions at this time, but understand that I may call the clinic during office hours   should I have any questions or need assistance in obtaining follow up care.    __________________________________________  _____________  __________ Signature of Patient or Authorized Representative            Date                   Time    __________________________________________ Nurse's Signature    

## 2012-08-24 NOTE — Telephone Encounter (Signed)
Per staff phone call and POF I have schedueld appts.  JMW  

## 2012-08-26 ENCOUNTER — Encounter: Payer: Self-pay | Admitting: Radiation Oncology

## 2012-08-27 ENCOUNTER — Telehealth: Payer: Self-pay | Admitting: *Deleted

## 2012-08-27 ENCOUNTER — Ambulatory Visit: Admission: RE | Admit: 2012-08-27 | Payer: Medicare Other | Source: Ambulatory Visit | Admitting: Radiation Oncology

## 2012-08-27 HISTORY — DX: Personal history of irradiation: Z92.3

## 2012-08-27 NOTE — Telephone Encounter (Signed)
Called patient home  To cancel patient's appt today due to inclement weather,"Husband stated""if MD is in we would like to come in,asked his wife, they will try to come in ",I gave him our direct phone number#[phone], to call us if they are unable to make it,husband said he would call if unable to come in.Will inform MD  9:46 AM

## 2012-09-01 ENCOUNTER — Other Ambulatory Visit (HOSPITAL_BASED_OUTPATIENT_CLINIC_OR_DEPARTMENT_OTHER): Payer: Medicare Other

## 2012-09-01 DIAGNOSIS — C9000 Multiple myeloma not having achieved remission: Secondary | ICD-10-CM

## 2012-09-01 LAB — CBC WITH DIFFERENTIAL/PLATELET
Basophils Absolute: 0 10*3/uL (ref 0.0–0.1)
EOS%: 0.1 % (ref 0.0–7.0)
Eosinophils Absolute: 0 10*3/uL (ref 0.0–0.5)
HCT: 25.9 % — ABNORMAL LOW (ref 34.8–46.6)
HGB: 9.1 g/dL — ABNORMAL LOW (ref 11.6–15.9)
MCH: 34.6 pg — ABNORMAL HIGH (ref 25.1–34.0)
MCV: 99 fL (ref 79.5–101.0)
MONO%: 13.4 % (ref 0.0–14.0)
NEUT#: 3.5 10*3/uL (ref 1.5–6.5)
NEUT%: 78.6 % — ABNORMAL HIGH (ref 38.4–76.8)

## 2012-09-01 LAB — COMPREHENSIVE METABOLIC PANEL (CC13)
ALT: 30 U/L (ref 0–55)
CO2: 26 mEq/L (ref 22–29)
Calcium: 8.5 mg/dL (ref 8.4–10.4)
Chloride: 104 mEq/L (ref 98–107)
Glucose: 77 mg/dl (ref 70–99)
Sodium: 136 mEq/L (ref 136–145)
Total Bilirubin: 0.35 mg/dL (ref 0.20–1.20)
Total Protein: 6.8 g/dL (ref 6.4–8.3)

## 2012-09-03 ENCOUNTER — Encounter: Payer: Self-pay | Admitting: Radiation Oncology

## 2012-09-03 ENCOUNTER — Ambulatory Visit
Admission: RE | Admit: 2012-09-03 | Discharge: 2012-09-03 | Disposition: A | Discharge status: Home / Self Care | Payer: Medicare Other | Source: Ambulatory Visit | Attending: Radiation Oncology | Admitting: Radiation Oncology

## 2012-09-03 VITALS — BP 133/70 | HR 75 | Temp 98.4°F | Resp 20 | Wt 119.7 lb

## 2012-09-03 DIAGNOSIS — C9000 Multiple myeloma not having achieved remission: Secondary | ICD-10-CM

## 2012-09-03 DIAGNOSIS — M546 Pain in thoracic spine: Secondary | ICD-10-CM

## 2012-09-03 LAB — PROTEIN ELECTROPHORESIS, SERUM
Albumin ELP: 45.2 % — ABNORMAL LOW (ref 55.8–66.1)
Alpha-1-Globulin: 8.3 % — ABNORMAL HIGH (ref 2.9–4.9)
Beta 2: 3.6 % (ref 3.2–6.5)
Beta Globulin: 6.1 % (ref 4.7–7.2)
Gamma Globulin: 25.7 % — ABNORMAL HIGH (ref 11.1–18.8)

## 2012-09-03 LAB — KAPPA/LAMBDA LIGHT CHAINS: Kappa:Lambda Ratio: 2.76 — ABNORMAL HIGH (ref 0.26–1.65)

## 2012-09-03 NOTE — Progress Notes (Signed)
follow up palliation rad tx spinal cord 07/13/12-07/27/12  3- 10 20 Gy/64fxs Alert oriented x3, fatigued, c/o pain lower mid back, appetite okay, better stated, occasional nausea, doesn't take meds, CXR 08/20/12 because patient had fever and cough , mostly coughs in am 4:14 PM

## 2012-09-03 NOTE — Progress Notes (Signed)
Radiation Oncology         [phone]) [phone] ________________________________  Name: Shannon Rush MRN: [id number]  Date: 09/03/2012  DOB: 23-Apr-1941  Follow-Up Visit Note  CC: Sanjuana Letters, MD  Shannon Parody, MD  Diagnosis:   72 yo woman with  from multiple myeloma s/p palliative radiotherapy to 20 gray in 10 fractions of 2 gray for thoracic spinal cord compression (T3-T10) and left scapular pain - 07/13/2012-07/27/2012   Interval Since Last Radiation:  5  weeks  Narrative:  The patient returns today for routine follow-up.  Her back pain has significantly improved. She now uses hydrocodone intermittently at night in the event that she has pain preventing comfortable sleeping. She reports that her scapular pain has also improved significantly and only hurts with rigorous physical therapy. She did experience radiation esophagitis symptoms which resolved 2 weeks after completion of treatment.                              ALLERGIES:  has No Known Allergies.  Meds: Current Outpatient Prescriptions  Medication Sig Dispense Refill  . acyclovir (ZOVIRAX) 200 MG capsule Take 2 capsules (400 mg total) by mouth daily.  30 capsule  3  . dexamethasone (DECADRON) 4 MG tablet Take 10 tablets (40 mg total) by mouth once a week.  100 tablet  6  . docusate sodium (COLACE) 100 MG capsule Take 100 mg by mouth 2 (two) times daily as needed.       . enoxaparin (LOVENOX) 40 MG/0.4ML injection Inject 0.4 mLs (40 mg total) into the skin daily.  30 Syringe  3  . HYDROcodone-acetaminophen (VICODIN) 5-500 MG per tablet Take 1 tablet by mouth every 6 (six) hours as needed for pain.  100 tablet  3  . lenalidomide (REVLIMID) 25 MG capsule Take 1 capsule (25 mg total) by mouth daily.  21 capsule  0  . diphenoxylate-atropine (LOMOTIL) 2.5-0.025 MG per tablet Take 2 tablets by mouth 4 (four) times daily as needed for diarrhea or loose stools.  60 tablet  0  . morphine (MS CONTIN) 15 MG 12 hr tablet Take 1 tablet (15  mg total) by mouth 2 (two) times daily.  60 tablet  0  . ondansetron (ZOFRAN) 4 MG tablet Take 1 tablet (4 mg total) by mouth every 8 (eight) hours as needed.  30 tablet  3   No current facility-administered medications for this encounter.    Physical Findings: The patient is in no acute distress. Patient is alert and oriented.  weight is 119 lb 11.2 oz (54.296 kg). Her oral temperature is 98.4 F (36.9 C). Her blood pressure is 133/70 and her pulse is 75. Her respiration is 20. Marland Kitchen Palpation of the patient's skull does reveal lytic deposits characterized as indentation this in the solid skull. However, the overlying scalp is entirely intact and there is no tenderness in these lytic deposits. The patient's gait is slow but steady with intact motor strength throughout. No significant changes.  Lab Findings: Lab Results  Component Value Date   WBC 4.5 09/01/2012   HGB 9.1* 09/01/2012   HCT 25.9* 09/01/2012   MCV 99.0 09/01/2012   PLT 322 09/01/2012   Radiographic Findings: Dg Chest 2 View  08/20/2012  *RADIOLOGY REPORT*  Clinical Data: Cough, fever.  CHEST - 2 VIEW  Comparison: April 01, 2011  Findings: Moderate thoracic kyphosis is noted secondary to old compression fractures in mid thoracic spine.  Cardiomediastinal silhouette appears normal.  Old left rib fractures are noted.  No acute pulmonary disease is noted.  There is interval development of a probable pathologic fracture involving the mid shaft of the left clavicle.  IMPRESSION: No acute pulmonary disease is noted.  Interval development of destructive lesion involving the midshaft of the left clavicle consistent with history of multiple myeloma.   Original Report Authenticated By: Lupita Raider.,  M.D.    Impression:  The patient has recovered from the effects of radiation.  Her presenting pain appears improved. Her spinal neurologic function appears grossly intact following radiation.  Plan:  The patient will followup in the radiation  clinic in the future on an as-needed basis. I'll be more than happy to remain involved in her care if clinically indicated.  _____________________________________  Artist Pais. Kathrynn Running, M.D.

## 2012-09-07 ENCOUNTER — Ambulatory Visit (HOSPITAL_BASED_OUTPATIENT_CLINIC_OR_DEPARTMENT_OTHER): Payer: Medicare Other | Admitting: Oncology

## 2012-09-07 ENCOUNTER — Ambulatory Visit (HOSPITAL_BASED_OUTPATIENT_CLINIC_OR_DEPARTMENT_OTHER): Payer: Medicare Other

## 2012-09-07 ENCOUNTER — Encounter: Payer: Self-pay | Admitting: Oncology

## 2012-09-07 ENCOUNTER — Telehealth: Payer: Self-pay | Admitting: Dietician

## 2012-09-07 VITALS — BP 127/89 | HR 89 | Temp 97.1°F | Wt 123.2 lb

## 2012-09-07 DIAGNOSIS — Z5112 Encounter for antineoplastic immunotherapy: Secondary | ICD-10-CM

## 2012-09-07 DIAGNOSIS — C9000 Multiple myeloma not having achieved remission: Secondary | ICD-10-CM

## 2012-09-07 DIAGNOSIS — R609 Edema, unspecified: Secondary | ICD-10-CM

## 2012-09-07 DIAGNOSIS — D649 Anemia, unspecified: Secondary | ICD-10-CM

## 2012-09-07 MED ORDER — ACYCLOVIR 200 MG PO CAPS: 400.0000 mg | ORAL_CAPSULE | Freq: Every day | ORAL | Status: DC

## 2012-09-07 MED ORDER — LENALIDOMIDE 15 MG PO CAPS: 15.0000 mg | ORAL_CAPSULE | Freq: Every day | ORAL | Status: DC

## 2012-09-07 MED ORDER — SODIUM CHLORIDE 0.9 % IV SOLN
Freq: Once | INTRAVENOUS | Status: AC
  Administered 2012-09-07: 16:00:00 via INTRAVENOUS

## 2012-09-07 MED ORDER — BORTEZOMIB CHEMO SQ INJECTION 3.5 MG (2.5MG/ML)
1.1700 mg/m2 | Freq: Once | INTRAMUSCULAR | Status: AC
  Administered 2012-09-07: 1.75 mg via SUBCUTANEOUS
  Filled 2012-09-07: qty 1.75

## 2012-09-07 MED ORDER — ZOLEDRONIC ACID 4 MG/5ML IV CONC
3.5000 mg | Freq: Once | INTRAVENOUS | Status: AC
  Administered 2012-09-07: 3.5 mg via INTRAVENOUS
  Filled 2012-09-07: qty 4.38

## 2012-09-07 MED ORDER — ONDANSETRON HCL 8 MG PO TABS
8.0000 mg | ORAL_TABLET | Freq: Once | ORAL | Status: AC
  Administered 2012-09-07: 8 mg via ORAL

## 2012-09-07 NOTE — Progress Notes (Signed)
[facility]  Telephone:(336) [phone] [phone]   OFFICE PROGRESS NOTE   Cc:  Sanjuana Letters, MD  DIAGNOSIS: multiple myeloma, presented with lytic bone lesions, anemia (Hgb 9.3). Presenting IgG was 4,040 mg/dL on 78/29/5621 (IgA 35; IgM 34); kappa free light chain 34.4 mg/dL, lambda 3.08, kappa:lambda ratio of 34.75; SPEP with M-spike of 2.60.  Bone marrow biopsy on 07/03/2012 showed 49% plasma cell.  Normal classical cytogenetics; however, myeloma FISH panel showed 13q-.   PAST THERAPY:  Palliative radiation 20 Gy over 10 fractions between 07/14/2012 and 07/27/2012 to thoracic spine cord compression and symptomatic left scapula lytic lesion.   CURRENT THERAPY:  Started on1/27/2014 SQ Velcade once weekly, 3 weeks on, 1 weeks off; daily Revlimid d1-21, 7 days off; and Dexamethasone 40mg  PO weekly. Dexamethasone has been reduced to 20 mg weekly due to edema.  INTERVAL HISTORY: Shannon Rush 72 y.o. female returns for regular follow up with her husband for cycle #2, day #1 of chemo Velcade. States fatigue is better. However, it is still difficult for her to get out and active.  She has fatigue and DOE walking about 50 feet.  She is still independent of light activities of daily living; however, she has no drive to do much.  She is still able to tutor ESL. She has low appetite but has gained a few pounds.  She has bilateral pedal edema but no PND, orthopnea, chest pain, abdominal swelling, ascites.  She still has a band of discomfort around her lower back.  It is mild; worsened with sudden body motion; no radiation; better than last month; no bowel/bladder incontinence; no leg weakness or paresthesia.   Patient denies fever, headache, visual changes, confusion, drenching night sweats, palpable lymph node swelling, mucositis, odynophagia, dysphagia, nausea vomiting, jaundice, productive cough, gum bleeding, epistaxis, hematemesis, hemoptysis, abdominal pain,  abdominal swelling, early satiety, melena, hematochezia, hematuria, skin rash, spontaneous bleeding, heat or cold intolerance, bowel bladder incontinence, depression.      Past Medical History  Diagnosis Date  . Multiple myeloma 07/01/2012  . Unspecified deficiency anemia   . OSTEOPOROSIS 06/11/2010    Multiple compression fractures; and spontaneous fracture of sternum Qualifier: Diagnosis of  By: Humberto Seals NP, Darl Pikes     . 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 Cord compression 07/13/12    MRI- diffuse myeloma involvement of T-L spine  . History of radiation therapy 07/13/12-07/27/12    spinal cord compression T3-T10,left scapula    Past Surgical History  Procedure Laterality Date  . Appendectomy    . Tubal ligation    . Cesarean section      x2   . Hip surgery  2009    left  . Elbow surgery    . Colonoscopy  2007    neg with Dr. Ewing Schlein    Current Outpatient Prescriptions  Medication Sig Dispense Refill  . acyclovir (ZOVIRAX) 200 MG capsule Take 2 capsules (400 mg total) by mouth daily.  30 capsule  3  . dexamethasone (DECADRON) 4 MG tablet Take 10 tablets (40 mg total) by mouth once a week.  100 tablet  6  . enoxaparin (LOVENOX) 40 MG/0.4ML injection Inject 0.4 mLs (40 mg total) into the skin daily.  30 Syringe  3  . HYDROcodone-acetaminophen (VICODIN) 5-500 MG per tablet Take 1 tablet by mouth every 6 (six) hours as needed for pain.  100 tablet  3  . diphenoxylate-atropine (LOMOTIL) 2.5-0.025  MG per tablet Take 2 tablets by mouth 4 (four) times daily as needed for diarrhea or loose stools.  60 tablet  0  . docusate sodium (COLACE) 100 MG capsule Take 100 mg by mouth 2 (two) times daily as needed.       Marland Kitchen lenalidomide (REVLIMID) 15 MG capsule Take 1 capsule (15 mg total) by mouth daily. Take 15 mg daily for 21 days and 7 days off.  21 capsule  0  . morphine (MS CONTIN) 15 MG 12 hr tablet Take 1 tablet (15 mg total) by mouth 2 (two) times daily.  60 tablet  0  . ondansetron  (ZOFRAN) 4 MG tablet Take 1 tablet (4 mg total) by mouth every 8 (eight) hours as needed.  30 tablet  3   No current facility-administered medications for this visit.    ALLERGIES:  has No Known Allergies.  REVIEW OF SYSTEMS:  The rest of the 14-point review of system was negative.   Filed Vitals:   09/07/12 1501  BP: 127/89  Pulse: 89  Temp: 97.1 F (36.2 C)   Wt Readings from Last 3 Encounters:  09/07/12 123 lb 3 oz (55.877 kg)  09/03/12 119 lb 11.2 oz (54.296 kg)  08/24/12 122 lb 14.4 oz (55.747 kg)   ECOG Performance status: 1  PHYSICAL EXAMINATION:   General:  thin appearing woman, less tired appearing than last week, in no acute distress.  Eyes:  no scleral icterus.  ENT:  There were no oropharyngeal lesions.  Neck was without thyromegaly.  Lymphatics:  Negative cervical, supraclavicular or axillary adenopathy.  Respiratory: lungs were clear bilaterally without wheezing or crackles.  Cardiovascular:  Regular rate and rhythm, S1/S2, without murmur, rub or gallop.  There was 1-2+ pidding pedal edema without calf/thigh pain or palpable cords.  GI:  abdomen was soft, flat, nontender, nondistended, without organomegaly.  Muscoloskeletal: palpation and percussion of right upper back did not result in any pain.  Skin exam was without echymosis, petichae. Neuro exam was nonfocal.  Patient was able to get on and off exam table without any assistance.  Gait was measured.  Patient was alerted and oriented.  Attention was good.   Language was appropriate.  Mood was normal without depression.  Speech was not pressured.  Thought content was not tangential.     LABORATORY/RADIOLOGY DATA:  Lab Results  Component Value Date   WBC 4.5 09/01/2012   HGB 9.1* 09/01/2012   HCT 25.9* 09/01/2012   PLT 322 09/01/2012   GLUCOSE 77 09/01/2012   CHOL 207* 06/02/2009   TRIG 68 06/02/2009   HDL 73 06/02/2009   LDLCALC 120* 06/02/2009   ALKPHOS 109 09/01/2012   ALT 30 09/01/2012   AST 19 09/01/2012   NA  136 09/01/2012   K 3.7 09/01/2012   CL 104 09/01/2012   CREATININE 0.7 09/01/2012   BUN 18.7 09/01/2012   CO2 26 09/01/2012    ASSESSMENT AND PLAN:   1.  IgG kappa multiple myeloma: - She is s/p cycle #.  She had grade 2-3 fatigue; grade 2 anemia; grade 2 pedal edema,  grade 2 diarrhea (which has resolved) with cycle 1.  These are all most likely due to chemo combo regimen.  - I advised her to proceed with cycle 2#, day #1 of chemo. Velcade dose has been reduced by 10% previously and we will continue with that dose.  Will reduce Revlimid to 15mg  PO daily due to fatigue with higher dose. Continue dexamethasone 20mg   PO qweek (from 40mg ) to attenuate the pedal edema.  - I advised her to continue the continue the following preventative medications   *  Acyclovir 400mg  PO daily to prevent risk of viral reactivation.  *  Lovenox injection 40mg  subcutaneous one a day to prevent blood clot.   3.  Pain control:  Using Hydrocodone PRN.   4.  Anemia:  Slightly worsened due to treatment.  There is not active bleeding.  There is no need for transfusion today.  I will check her CBC next week during the week off to see if she needs transfusion.  5.  Pedal edema:  Due to dexamethasone.  There was low clinical suspicion for DVT.  If despite dose reduction of dexamethasone, and her pedal edema worsens, I may consider Lasix. I have discussed Lasix with her and she has declined at this time. She plans to use compression stockings.  6.  Follow up: lab and chemo 3 weeks on and 1 week off. Lab only 2/17. Visit 2/24 to begin cycle 3.

## 2012-09-07 NOTE — Telephone Encounter (Signed)
Patient has been identified to be at risk on malnutrition screen.  Called patient and left a message for patient to call if she has any questions regarding nutrition.  Oran Rein, RD, LDN

## 2012-09-07 NOTE — Patient Instructions (Signed)
[facility] Discharge Instructions for Patients Receiving Chemotherapy  Today you received the following chemotherapy agents Velcade, Zometa  To help prevent nausea and vomiting after your treatment, we encourage you to take your nausea medication.   If you develop nausea and vomiting that is not controlled by your nausea medication, call the clinic. If it is after clinic hours your family physician or the after hours number for the clinic or go to the Emergency Department.   BELOW ARE SYMPTOMS THAT SHOULD BE REPORTED IMMEDIATELY:  *FEVER GREATER THAN 100.5 F  *CHILLS WITH OR WITHOUT FEVER  NAUSEA AND VOMITING THAT IS NOT CONTROLLED WITH YOUR NAUSEA MEDICATION  *UNUSUAL SHORTNESS OF BREATH  *UNUSUAL BRUISING OR BLEEDING  TENDERNESS IN MOUTH AND THROAT WITH OR WITHOUT PRESENCE OF ULCERS  *URINARY PROBLEMS  *BOWEL PROBLEMS  UNUSUAL RASH Items with * indicate a potential emergency and should be followed up as soon as possible.  One of the nurses will contact you 24 hours after your treatment. Please let the nurse know about any problems that you may have experienced. Feel free to call the clinic you have any questions or concerns. The clinic phone number is [phone].   I have been informed and understand all the instructions given to me. I know to contact the clinic, my physician, or go to the Emergency Department if any problems should occur. I do not have any questions at this time, but understand that I may call the clinic during office hours   should I have any questions or need assistance in obtaining follow up care.    __________________________________________  _____________  __________ Signature of Patient or Authorized Representative            Date                   Time    __________________________________________ Nurse's Signature

## 2012-09-08 ENCOUNTER — Telehealth: Payer: Self-pay | Admitting: Oncology

## 2012-09-08 NOTE — Telephone Encounter (Signed)
S/W THE PT AND SHE IS AWARE TO PICK UP THE REST OF HER APPT SCHEDULES WHEN SHE COMES IN FOR HER 09/14/2012 APPT

## 2012-09-11 ENCOUNTER — Telehealth: Payer: Self-pay | Admitting: Oncology

## 2012-09-11 NOTE — Telephone Encounter (Signed)
returned pt called and lvm regarding all appt times...Marland Kitchenpt needed all 2:00pm and after appts.Marland KitchenMarland KitchenMarland KitchenDone

## 2012-09-14 ENCOUNTER — Ambulatory Visit (HOSPITAL_BASED_OUTPATIENT_CLINIC_OR_DEPARTMENT_OTHER): Payer: Medicare Other

## 2012-09-14 ENCOUNTER — Other Ambulatory Visit: Payer: Medicare Other | Admitting: Lab

## 2012-09-14 ENCOUNTER — Other Ambulatory Visit (HOSPITAL_BASED_OUTPATIENT_CLINIC_OR_DEPARTMENT_OTHER): Payer: Medicare Other

## 2012-09-14 VITALS — BP 136/79 | HR 91 | Temp 96.8°F | Resp 20

## 2012-09-14 DIAGNOSIS — C9000 Multiple myeloma not having achieved remission: Secondary | ICD-10-CM

## 2012-09-14 DIAGNOSIS — Z5112 Encounter for antineoplastic immunotherapy: Secondary | ICD-10-CM

## 2012-09-14 LAB — CBC WITH DIFFERENTIAL/PLATELET
BASO%: 2.2 % — ABNORMAL HIGH (ref 0.0–2.0)
Basophils Absolute: 0.1 10*3/uL (ref 0.0–0.1)
EOS%: 4 % (ref 0.0–7.0)
HCT: 29.2 % — ABNORMAL LOW (ref 34.8–46.6)
HGB: 9.9 g/dL — ABNORMAL LOW (ref 11.6–15.9)
LYMPH%: 6.7 % — ABNORMAL LOW (ref 14.0–49.7)
MCH: 34.2 pg — ABNORMAL HIGH (ref 25.1–34.0)
MCHC: 34.1 g/dL (ref 31.5–36.0)
MCV: 100.3 fL (ref 79.5–101.0)
MONO%: 9.4 % (ref 0.0–14.0)
NEUT%: 77.7 % — ABNORMAL HIGH (ref 38.4–76.8)

## 2012-09-14 MED ORDER — BORTEZOMIB CHEMO SQ INJECTION 3.5 MG (2.5MG/ML)
1.1700 mg/m2 | Freq: Once | INTRAMUSCULAR | Status: AC
  Administered 2012-09-14: 1.75 mg via SUBCUTANEOUS
  Filled 2012-09-14: qty 1.75

## 2012-09-14 MED ORDER — ONDANSETRON HCL 8 MG PO TABS
8.0000 mg | ORAL_TABLET | Freq: Once | ORAL | Status: AC
  Administered 2012-09-14: 8 mg via ORAL

## 2012-09-16 ENCOUNTER — Other Ambulatory Visit: Payer: Self-pay | Admitting: Oncology

## 2012-09-21 ENCOUNTER — Ambulatory Visit: Payer: Medicare Other

## 2012-09-23 ENCOUNTER — Ambulatory Visit (HOSPITAL_BASED_OUTPATIENT_CLINIC_OR_DEPARTMENT_OTHER): Payer: Medicare Other

## 2012-09-23 ENCOUNTER — Other Ambulatory Visit (HOSPITAL_BASED_OUTPATIENT_CLINIC_OR_DEPARTMENT_OTHER): Payer: Medicare Other

## 2012-09-23 ENCOUNTER — Other Ambulatory Visit: Payer: Medicare Other | Admitting: Lab

## 2012-09-23 VITALS — BP 135/81 | HR 74

## 2012-09-23 DIAGNOSIS — C9000 Multiple myeloma not having achieved remission: Secondary | ICD-10-CM

## 2012-09-23 DIAGNOSIS — Z5112 Encounter for antineoplastic immunotherapy: Secondary | ICD-10-CM

## 2012-09-23 LAB — CBC WITH DIFFERENTIAL/PLATELET
BASO%: 1 % (ref 0.0–2.0)
HCT: 28.9 % — ABNORMAL LOW (ref 34.8–46.6)
LYMPH%: 7.4 % — ABNORMAL LOW (ref 14.0–49.7)
MCHC: 34.5 g/dL (ref 31.5–36.0)
MONO#: 0.4 10*3/uL (ref 0.1–0.9)
NEUT%: 85 % — ABNORMAL HIGH (ref 38.4–76.8)
Platelets: 259 10*3/uL (ref 145–400)
WBC: 6.8 10*3/uL (ref 3.9–10.3)

## 2012-09-23 MED ORDER — ONDANSETRON HCL 8 MG PO TABS
8.0000 mg | ORAL_TABLET | Freq: Once | ORAL | Status: AC
  Administered 2012-09-23: 8 mg via ORAL

## 2012-09-23 MED ORDER — BORTEZOMIB CHEMO SQ INJECTION 3.5 MG (2.5MG/ML)
1.1700 mg/m2 | Freq: Once | INTRAMUSCULAR | Status: AC
  Administered 2012-09-23: 1.75 mg via SUBCUTANEOUS
  Filled 2012-09-23: qty 1.75

## 2012-09-28 ENCOUNTER — Other Ambulatory Visit (HOSPITAL_BASED_OUTPATIENT_CLINIC_OR_DEPARTMENT_OTHER): Payer: Medicare Other

## 2012-09-28 ENCOUNTER — Telehealth: Payer: Self-pay | Admitting: *Deleted

## 2012-09-28 DIAGNOSIS — C9000 Multiple myeloma not having achieved remission: Secondary | ICD-10-CM

## 2012-09-28 LAB — COMPREHENSIVE METABOLIC PANEL (CC13)
AST: 22 U/L (ref 5–34)
Albumin: 3.1 g/dL — ABNORMAL LOW (ref 3.5–5.0)
Alkaline Phosphatase: 111 U/L (ref 40–150)
Glucose: 83 mg/dl (ref 70–99)
Potassium: 3.4 mEq/L — ABNORMAL LOW (ref 3.5–5.1)
Sodium: 137 mEq/L (ref 136–145)
Total Bilirubin: 0.44 mg/dL (ref 0.20–1.20)
Total Protein: 7.3 g/dL (ref 6.4–8.3)

## 2012-09-28 LAB — CBC WITH DIFFERENTIAL/PLATELET
BASO%: 1.2 % (ref 0.0–2.0)
EOS%: 2.9 % (ref 0.0–7.0)
LYMPH%: 7.6 % — ABNORMAL LOW (ref 14.0–49.7)
MCH: 34.2 pg — ABNORMAL HIGH (ref 25.1–34.0)
MCHC: 34.2 g/dL (ref 31.5–36.0)
MCV: 99.8 fL (ref 79.5–101.0)
MONO%: 5.4 % (ref 0.0–14.0)
NEUT#: 3.7 10*3/uL (ref 1.5–6.5)
Platelets: 247 10*3/uL (ref 145–400)
RBC: 3.08 10*6/uL — ABNORMAL LOW (ref 3.70–5.45)
RDW: 18.4 % — ABNORMAL HIGH (ref 11.2–14.5)

## 2012-09-28 MED ORDER — HYDROCODONE-ACETAMINOPHEN 5-325 MG PO TABS: 1.0000 | ORAL_TABLET | Freq: Four times a day (QID) | ORAL | Status: DC | PRN

## 2012-09-28 MED ORDER — DEXAMETHASONE 4 MG PO TABS: 20.0000 mg | ORAL_TABLET | ORAL | Status: DC

## 2012-09-28 NOTE — Telephone Encounter (Signed)
Pt called to clarify her Decadron dose,  States she was instructed by Dr. Gaylyn Rong to take 20 mg weekly, but the bottle says 40 mg.   Per Dr. Gaylyn Rong,  He did decrease pt's dose to 20 mg, 5 tabs, once a week due to side effects.  Informed pt that 5 tabs (20 mg) is now the correct dose and she verbalized understanding.

## 2012-09-28 NOTE — Telephone Encounter (Signed)
Call from Crystal at [facility], pt requesting refill on hydrocodone.  Called in refill per Dr. Gaylyn Rong.

## 2012-09-29 ENCOUNTER — Other Ambulatory Visit: Payer: Self-pay | Admitting: *Deleted

## 2012-09-29 NOTE — Telephone Encounter (Signed)
THIS REFILL REQUEST FOR REVLIMID WAS PLACED IN DR.HA'S ACTIVE WORK FOLDER. 

## 2012-09-30 ENCOUNTER — Other Ambulatory Visit: Payer: Self-pay | Admitting: *Deleted

## 2012-09-30 DIAGNOSIS — C9000 Multiple myeloma not having achieved remission: Secondary | ICD-10-CM

## 2012-09-30 LAB — PROTEIN ELECTROPHORESIS, SERUM
Albumin ELP: 51.6 % — ABNORMAL LOW (ref 55.8–66.1)
Beta 2: 3.5 % (ref 3.2–6.5)
Beta Globulin: 6.1 % (ref 4.7–7.2)
Gamma Globulin: 22.7 % — ABNORMAL HIGH (ref 11.1–18.8)

## 2012-09-30 LAB — KAPPA/LAMBDA LIGHT CHAINS: Kappa:Lambda Ratio: 3.14 — ABNORMAL HIGH (ref 0.26–1.65)

## 2012-09-30 MED ORDER — LENALIDOMIDE 15 MG PO CAPS: 15.0000 mg | ORAL_CAPSULE | Freq: Every day | ORAL | Status: DC

## 2012-10-01 ENCOUNTER — Telehealth: Payer: Self-pay | Admitting: *Deleted

## 2012-10-01 NOTE — Telephone Encounter (Signed)
Call from [facility] at Dr. Lillia Abed office requesting recent labs and office note faxed to them at fax#[phone].  Faxed labs/office note.

## 2012-10-04 ENCOUNTER — Other Ambulatory Visit: Payer: Self-pay | Admitting: Oncology

## 2012-10-04 NOTE — Patient Instructions (Addendum)
1. Diagnosis: IgG kappa multiple myeloma:  2. Treatment: Velcade-Revlimid-dexamethasone. 3. Status: -Continue to have response with chemotherapy with decrease in M spike - Continue cycle #3. If no further decrease in M spike, we may consider slightly increase the dose of Revlimid with the fourth cycle. 4.  continue to the following supportive care medications:  * Acyclovir 400mg  PO daily to prevent risk of viral reactivation.  * Lovenox injection 40mg  subcutaneous one a day to prevent blood clot.

## 2012-10-05 ENCOUNTER — Telehealth: Payer: Self-pay | Admitting: Oncology

## 2012-10-05 ENCOUNTER — Other Ambulatory Visit: Payer: Medicare Other | Admitting: Lab

## 2012-10-05 ENCOUNTER — Ambulatory Visit (HOSPITAL_BASED_OUTPATIENT_CLINIC_OR_DEPARTMENT_OTHER): Payer: Medicare Other

## 2012-10-05 ENCOUNTER — Ambulatory Visit: Payer: Medicare Other | Admitting: Oncology

## 2012-10-05 ENCOUNTER — Ambulatory Visit (HOSPITAL_BASED_OUTPATIENT_CLINIC_OR_DEPARTMENT_OTHER): Payer: Medicare Other | Admitting: Oncology

## 2012-10-05 ENCOUNTER — Other Ambulatory Visit (HOSPITAL_BASED_OUTPATIENT_CLINIC_OR_DEPARTMENT_OTHER): Payer: Medicare Other

## 2012-10-05 ENCOUNTER — Telehealth: Payer: Self-pay | Admitting: *Deleted

## 2012-10-05 VITALS — BP 133/77 | HR 74 | Temp 96.9°F | Resp 18 | Ht 68.0 in | Wt 120.7 lb

## 2012-10-05 DIAGNOSIS — Z5112 Encounter for antineoplastic immunotherapy: Secondary | ICD-10-CM

## 2012-10-05 DIAGNOSIS — D649 Anemia, unspecified: Secondary | ICD-10-CM

## 2012-10-05 DIAGNOSIS — C9000 Multiple myeloma not having achieved remission: Secondary | ICD-10-CM

## 2012-10-05 DIAGNOSIS — R609 Edema, unspecified: Secondary | ICD-10-CM

## 2012-10-05 LAB — CBC WITH DIFFERENTIAL/PLATELET
Basophils Absolute: 0.1 10*3/uL (ref 0.0–0.1)
HCT: 31.4 % — ABNORMAL LOW (ref 34.8–46.6)
HGB: 10.2 g/dL — ABNORMAL LOW (ref 11.6–15.9)
LYMPH%: 17 % (ref 14.0–49.7)
MCH: 32.9 pg (ref 25.1–34.0)
MCHC: 32.5 g/dL (ref 31.5–36.0)
MONO#: 0.8 10*3/uL (ref 0.1–0.9)
NEUT%: 59 % (ref 38.4–76.8)
Platelets: 231 10*3/uL (ref 145–400)
WBC: 4.4 10*3/uL (ref 3.9–10.3)
lymph#: 0.8 10*3/uL — ABNORMAL LOW (ref 0.9–3.3)

## 2012-10-05 MED ORDER — BORTEZOMIB CHEMO SQ INJECTION 3.5 MG (2.5MG/ML)
1.1700 mg/m2 | Freq: Once | INTRAMUSCULAR | Status: AC
  Administered 2012-10-05: 1.75 mg via SUBCUTANEOUS
  Filled 2012-10-05: qty 1.75

## 2012-10-05 MED ORDER — ZOLEDRONIC ACID 4 MG/100ML IV SOLN
4.0000 mg | Freq: Once | INTRAVENOUS | Status: DC
  Filled 2012-10-05: qty 100

## 2012-10-05 MED ORDER — ZOLEDRONIC ACID 4 MG/5ML IV CONC
3.5000 mg | Freq: Once | INTRAVENOUS | Status: AC
  Administered 2012-10-05: 3.5 mg via INTRAVENOUS
  Filled 2012-10-05: qty 4.38

## 2012-10-05 MED ORDER — ONDANSETRON HCL 8 MG PO TABS
8.0000 mg | ORAL_TABLET | Freq: Once | ORAL | Status: AC
  Administered 2012-10-05: 8 mg via ORAL

## 2012-10-05 MED ORDER — SODIUM CHLORIDE 0.9 % IV SOLN
Freq: Once | INTRAVENOUS | Status: AC
  Administered 2012-10-05: 16:00:00 via INTRAVENOUS

## 2012-10-05 MED ORDER — ZOLEDRONIC ACID 4 MG/5ML IV CONC: 4.0000 mg | Freq: Once | INTRAVENOUS | Status: DC

## 2012-10-05 NOTE — Progress Notes (Signed)
[facility]  Telephone:(336) [phone] Fax:(336) [phone]   OFFICE PROGRESS NOTE   Cc:  Sanjuana Letters, MD  DIAGNOSIS: multiple myeloma, presented with lytic bone lesions, anemia (Hgb 9.3). Presenting IgG was 4,040 mg/dL on 69/62/9528 (IgA 35; IgM 34); kappa free light chain 34.4 mg/dL, lambda 4.13, kappa:lambda ratio of 34.75; SPEP with M-spike of 2.60.  Bone marrow biopsy on 07/03/2012 showed 49% plasma cell.  Normal classical cytogenetics; however, myeloma FISH panel showed 13q-.   PAST THERAPY:  Palliative radiation 20 Gy over 10 fractions between 07/14/2012 and 07/27/2012 to thoracic spine cord compression and symptomatic left scapula lytic lesion.   CURRENT THERAPY:  Started on1/27/2014 SQ Velcade once weekly, 3 weeks on, 1 weeks off; daily Revlimid d1-21, 7 days off; and Dexamethasone 40mg  PO weekly.   INTERVAL HISTORY: Shannon Rush 72 y.o. female returns for regular follow up with her husband for cycle #3, day #1 of chemo.  She reported that with dose reduction after the 1st cycle.  Her fatigue now is mild.  She is able to ambulate without cane or walker.  She has normal appetite and is independent of all activities of daily living.  Her back pain is minimal.  She only needs pain med about once daily.  She still has slightly unstable weight due to long term back pain.  She has not had recent fall.  She is having PT/OT at home.   Patient denies fever, anorexia, weight loss, headache, visual changes, confusion, drenching night sweats, palpable lymph node swelling, mucositis, odynophagia, dysphagia, nausea vomiting, jaundice, chest pain, palpitation, shortness of breath, dyspnea on exertion, productive cough, gum bleeding, epistaxis, hematemesis, hemoptysis, abdominal pain, abdominal swelling, early satiety, melena, hematochezia, hematuria, skin rash, spontaneous bleeding,heat or cold intolerance, bowel bladder incontinence, focal motor weakness, paresthesia,  depression.      Past Medical History  Diagnosis Date  . Multiple myeloma 07/01/2012  . Unspecified deficiency anemia   . OSTEOPOROSIS 06/11/2010    Multiple compression fractures; and spontaneous fracture of sternum Qualifier: Diagnosis of  By: Humberto Seals NP, Darl Pikes     . 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 Cord compression 07/13/12    MRI- diffuse myeloma involvement of T-L spine  . History of radiation therapy 07/13/12-07/27/12    spinal cord compression T3-T10,left scapula    Past Surgical History  Procedure Laterality Date  . Appendectomy    . Tubal ligation    . Cesarean section      x2   . Hip surgery  2009    left  . Elbow surgery    . Colonoscopy  2007    neg with Dr. Ewing Schlein    Current Outpatient Prescriptions  Medication Sig Dispense Refill  . acyclovir (ZOVIRAX) 200 MG capsule Take 2 capsules (400 mg total) by mouth daily.  30 capsule  3  . dexamethasone (DECADRON) 4 MG tablet Take 5 tablets (20 mg total) by mouth once a week.      . diphenoxylate-atropine (LOMOTIL) 2.5-0.025 MG per tablet Take 2 tablets by mouth 4 (four) times daily as needed for diarrhea or loose stools.  60 tablet  0  . docusate sodium (COLACE) 100 MG capsule Take 100 mg by mouth 2 (two) times daily as needed.       . enoxaparin (LOVENOX) 40 MG/0.4ML injection Inject 0.4 mLs (40 mg total) into the skin daily.  30 Syringe  3  . HYDROcodone-acetaminophen (NORCO) 5-325 MG per tablet Take 1  tablet by mouth every 6 (six) hours as needed for pain.  90 tablet  0  . lenalidomide (REVLIMID) 15 MG capsule Take 1 capsule (15 mg total) by mouth daily. Take 15 mg daily for 21 days and 7 days off.  21 capsule  0  . morphine (MS CONTIN) 15 MG 12 hr tablet Take 1 tablet (15 mg total) by mouth 2 (two) times daily.  60 tablet  0  . ondansetron (ZOFRAN) 4 MG tablet Take 1 tablet (4 mg total) by mouth every 8 (eight) hours as needed.  30 tablet  3   No current facility-administered medications for this visit.      ALLERGIES:  has No Known Allergies.  REVIEW OF SYSTEMS:  The rest of the 14-point review of system was negative.   Filed Vitals:   10/05/12 1437  BP: 133/77  Pulse: 74  Temp: 96.9 F (36.1 C)  Resp: 18   Wt Readings from Last 3 Encounters:  10/05/12 120 lb 11.2 oz (54.749 kg)  09/07/12 123 lb 3 oz (55.877 kg)  09/03/12 119 lb 11.2 oz (54.296 kg)   ECOG Performance status: 1  PHYSICAL EXAMINATION:   General:  thin appearing woman, less tired appearing than last week, in no acute distress.  Eyes:  no scleral icterus.  ENT:  There were no oropharyngeal lesions.  Neck was without thyromegaly.  Lymphatics:  Negative cervical, supraclavicular or axillary adenopathy.  Respiratory: lungs were clear bilaterally without wheezing or crackles.  Cardiovascular:  Regular rate and rhythm, S1/S2, without murmur, rub or gallop.  There was 1-2+ pidding pedal edema without calf/thigh pain or palpable cords.  GI:  abdomen was soft, flat, nontender, nondistended, without organomegaly.  Muscoloskeletal: palpation and percussion of right upper back did not result in any pain.  Skin exam was without echymosis, petichae. Neuro exam was nonfocal.  Patient was able to get on and off exam table without any assistance.  Gait was measured.  Patient was alerted and oriented.  Attention was good.   Language was appropriate.  Mood was normal without depression.  Speech was not pressured.  Thought content was not tangential.     LABORATORY/RADIOLOGY DATA:  Lab Results  Component Value Date   WBC 4.4 10/05/2012   HGB 10.2* 10/05/2012   HCT 31.4* 10/05/2012   PLT 231 10/05/2012   GLUCOSE 83 09/28/2012   CHOL 207* 06/02/2009   TRIG 68 06/02/2009   HDL 73 06/02/2009   LDLCALC 120* 06/02/2009   ALKPHOS 111 09/28/2012   ALT 27 09/28/2012   AST 22 09/28/2012   NA 137 09/28/2012   K 3.4* 09/28/2012   CL 103 09/28/2012   CREATININE 0.7 09/28/2012   BUN 10.0 09/28/2012   CO2 27 09/28/2012   M-spike 1.11 (was 2.6 at  diagnosis). Serum free kappa light chain 4.49 (was 34.4 at diagnosis). Serum kappa:lambda ratio:  3.14 (was 34.75 at diagnosis).   ASSESSMENT AND PLAN:    1. Diagnosis: IgG kappa multiple myeloma:  2. Treatment: Velcade-Revlimid-dexamethasone. 3. Status: -Continue to have response with chemotherapy with decrease in M spike - Continue cycle #3. If no further decrease in M spike, we may consider slightly increase the dose of Revlimid with the fourth cycle from 15mg  to 20mg   4.  continue to the following supportive care medications:  * Acyclovir 400mg  PO daily to prevent risk of viral reactivation.  * Lovenox injection 40mg  subcutaneous one a day to prevent blood clot.  5.    Pain control:  Just on MS Contin once daily without breakthrough pain meds.   6.    Anemia:  Slightly worsened due to treatment.  There is not active bleeding.  There is no need for transfusion today.  I will check her CBC next week during the week off to see if she needs transfusion.  7.  Pedal edema:  Due to dexamethasone.  There was low clinical suspicion for DVT. This is stable and improves with leg elevation.  I advised again leg elevation at night and defer Lasix if it worsens.   8.  Follow up: in about 4 weeks before cycle #4 of chemo.  She has follow up at [facility] after 4-6 cycles of chemo depending on her response.    The length of time of the face-to-face encounter was 25  minutes. More than 50% of time was spent counseling and coordination of care.

## 2012-10-05 NOTE — Telephone Encounter (Signed)
Appt for chemo moved to 4/23 per pt rqst ok per Dr. Gaylyn Rong

## 2012-10-05 NOTE — Telephone Encounter (Signed)
Per staff phone call and POF I have schedueld appts.  JMW  

## 2012-10-05 NOTE — Telephone Encounter (Signed)
Gave pt appt for lab, chemo and MD for April and May 2014

## 2012-10-05 NOTE — Patient Instructions (Addendum)
Patient aware of next appointment; discharged home with no complaints. 

## 2012-10-12 ENCOUNTER — Other Ambulatory Visit: Payer: Medicare Other | Admitting: Lab

## 2012-10-12 ENCOUNTER — Other Ambulatory Visit (HOSPITAL_BASED_OUTPATIENT_CLINIC_OR_DEPARTMENT_OTHER): Payer: Medicare Other

## 2012-10-12 ENCOUNTER — Telehealth: Payer: Self-pay | Admitting: Dietician

## 2012-10-12 ENCOUNTER — Ambulatory Visit (HOSPITAL_BASED_OUTPATIENT_CLINIC_OR_DEPARTMENT_OTHER): Payer: Medicare Other

## 2012-10-12 VITALS — BP 135/69 | HR 66 | Temp 97.2°F | Resp 18

## 2012-10-12 DIAGNOSIS — Z5112 Encounter for antineoplastic immunotherapy: Secondary | ICD-10-CM

## 2012-10-12 DIAGNOSIS — C9000 Multiple myeloma not having achieved remission: Secondary | ICD-10-CM

## 2012-10-12 LAB — CBC WITH DIFFERENTIAL/PLATELET
BASO%: 3.1 % — ABNORMAL HIGH (ref 0.0–2.0)
Eosinophils Absolute: 0.1 10*3/uL (ref 0.0–0.5)
MONO#: 0.5 10*3/uL (ref 0.1–0.9)
NEUT#: 2.1 10*3/uL (ref 1.5–6.5)
RBC: 3.03 10*6/uL — ABNORMAL LOW (ref 3.70–5.45)
RDW: 18.4 % — ABNORMAL HIGH (ref 11.2–14.5)
WBC: 3.2 10*3/uL — ABNORMAL LOW (ref 3.9–10.3)
lymph#: 0.3 10*3/uL — ABNORMAL LOW (ref 0.9–3.3)
nRBC: 0 % (ref 0–0)

## 2012-10-12 MED ORDER — BORTEZOMIB CHEMO SQ INJECTION 3.5 MG (2.5MG/ML)
1.1700 mg/m2 | Freq: Once | INTRAMUSCULAR | Status: AC
  Administered 2012-10-12: 1.75 mg via SUBCUTANEOUS
  Filled 2012-10-12: qty 1.75

## 2012-10-12 MED ORDER — ONDANSETRON HCL 8 MG PO TABS
8.0000 mg | ORAL_TABLET | Freq: Once | ORAL | Status: AC
  Administered 2012-10-12: 8 mg via ORAL

## 2012-10-12 NOTE — Patient Instructions (Addendum)
[facility] Discharge Instructions for Patients Receiving Chemotherapy  Today you received the following chemotherapy agents :  Velcade.  To help prevent nausea and vomiting after your treatment, we encourage you to take your nausea medication as instructed by your physician.    If you develop nausea and vomiting that is not controlled by your nausea medication, call the clinic. If it is after clinic hours your family physician or the after hours number for the clinic or go to the Emergency Department.   BELOW ARE SYMPTOMS THAT SHOULD BE REPORTED IMMEDIATELY:  *FEVER GREATER THAN 100.5 F  *CHILLS WITH OR WITHOUT FEVER  NAUSEA AND VOMITING THAT IS NOT CONTROLLED WITH YOUR NAUSEA MEDICATION  *UNUSUAL SHORTNESS OF BREATH  *UNUSUAL BRUISING OR BLEEDING  TENDERNESS IN MOUTH AND THROAT WITH OR WITHOUT PRESENCE OF ULCERS  *URINARY PROBLEMS  *BOWEL PROBLEMS  UNUSUAL RASH Items with * indicate a potential emergency and should be followed up as soon as possible.  One of the nurses will contact you 24 hours after your treatment. Please let the nurse know about any problems that you may have experienced. Feel free to call the clinic you have any questions or concerns. The clinic phone number is [phone].   I have been informed and understand all the instructions given to me. I know to contact the clinic, my physician, or go to the Emergency Department if any problems should occur. I do not have any questions at this time, but understand that I may call the clinic during office hours   should I have any questions or need assistance in obtaining follow up care.    __________________________________________  _____________  __________ Signature of Patient or Authorized Representative            Date                   Time    __________________________________________ Nurse's Signature    

## 2012-10-12 NOTE — Telephone Encounter (Signed)
Brief Outpatient Oncology Nutrition Note  Patient has been identified to be at risk on malnutrition screen.   Wt Readings from Last 10 Encounters:  10/05/12 120 lb 11.2 oz (54.749 kg)  09/07/12 123 lb 3 oz (55.877 kg)  09/03/12 119 lb 11.2 oz (54.296 kg)  08/24/12 122 lb 14.4 oz (55.747 kg)  08/20/12 118 lb 3.2 oz (53.615 kg)  08/17/12 118 lb 3.2 oz (53.615 kg)  08/10/12 112 lb 12.8 oz (51.166 kg)  07/29/12 114 lb 12.8 oz (52.073 kg)  07/24/12 118 lb (53.524 kg)  07/17/12 123 lb 8 oz (56.019 kg)    Called and spoke to patient's husband who states that patient is doing well with eating and has not concerns at this time.  Encouraged to call if questions.  Oran Rein, RD, LDN

## 2012-10-15 ENCOUNTER — Telehealth: Payer: Self-pay | Admitting: *Deleted

## 2012-10-15 ENCOUNTER — Other Ambulatory Visit: Payer: Self-pay | Admitting: Oncology

## 2012-10-15 DIAGNOSIS — C9000 Multiple myeloma not having achieved remission: Secondary | ICD-10-CM

## 2012-10-15 NOTE — Telephone Encounter (Signed)
Pt called to clarify her chemo schedule is 3 weeks on and one week off.  She changed her days to Wednesday and some of them are in the mornings.  She prefers afternoons.  Sent POF to change appt on 4/30 and 5/07 to the afternoon.

## 2012-10-16 ENCOUNTER — Telehealth: Payer: Self-pay | Admitting: Oncology

## 2012-10-16 ENCOUNTER — Telehealth: Payer: Self-pay | Admitting: *Deleted

## 2012-10-16 NOTE — Telephone Encounter (Signed)
Sent michelle a staff msg to r/s the chemo appts to the afternoon per pt's request.

## 2012-10-16 NOTE — Telephone Encounter (Signed)
LMONVM ADVISING THE PT OF HER APPT TIME CHANGES FOR April APPTS.

## 2012-10-16 NOTE — Telephone Encounter (Signed)
Per staff message and POF I have scheduled appts.  JMW  

## 2012-10-16 NOTE — Telephone Encounter (Signed)
Per staff message and POF I have adjusted appts. JMW  

## 2012-10-19 ENCOUNTER — Other Ambulatory Visit (HOSPITAL_BASED_OUTPATIENT_CLINIC_OR_DEPARTMENT_OTHER): Payer: Medicare Other

## 2012-10-19 ENCOUNTER — Ambulatory Visit (HOSPITAL_BASED_OUTPATIENT_CLINIC_OR_DEPARTMENT_OTHER): Payer: Medicare Other

## 2012-10-19 ENCOUNTER — Other Ambulatory Visit: Payer: Medicare Other | Admitting: Lab

## 2012-10-19 VITALS — BP 123/90 | HR 84 | Temp 96.5°F | Resp 18

## 2012-10-19 DIAGNOSIS — Z5112 Encounter for antineoplastic immunotherapy: Secondary | ICD-10-CM

## 2012-10-19 DIAGNOSIS — C9000 Multiple myeloma not having achieved remission: Secondary | ICD-10-CM

## 2012-10-19 LAB — CBC WITH DIFFERENTIAL/PLATELET
BASO%: 0.8 % (ref 0.0–2.0)
Basophils Absolute: 0.1 10*3/uL (ref 0.0–0.1)
HCT: 34.3 % — ABNORMAL LOW (ref 34.8–46.6)
HGB: 11.4 g/dL — ABNORMAL LOW (ref 11.6–15.9)
MONO#: 0.3 10*3/uL (ref 0.1–0.9)
NEUT%: 91 % — ABNORMAL HIGH (ref 38.4–76.8)
RDW: 18 % — ABNORMAL HIGH (ref 11.2–14.5)
WBC: 8.1 10*3/uL (ref 3.9–10.3)
lymph#: 0.4 10*3/uL — ABNORMAL LOW (ref 0.9–3.3)

## 2012-10-19 LAB — BASIC METABOLIC PANEL (CC13)
BUN: 14.7 mg/dL (ref 7.0–26.0)
CO2: 25 mEq/L (ref 22–29)
Chloride: 104 mEq/L (ref 98–107)
Creatinine: 0.7 mg/dL (ref 0.6–1.1)
Potassium: 3.5 mEq/L (ref 3.5–5.1)

## 2012-10-19 MED ORDER — BORTEZOMIB CHEMO SQ INJECTION 3.5 MG (2.5MG/ML)
1.1700 mg/m2 | Freq: Once | INTRAMUSCULAR | Status: AC
  Administered 2012-10-19: 1.75 mg via SUBCUTANEOUS
  Filled 2012-10-19: qty 1.75

## 2012-10-19 MED ORDER — ONDANSETRON HCL 8 MG PO TABS
8.0000 mg | ORAL_TABLET | Freq: Once | ORAL | Status: AC
  Administered 2012-10-19: 8 mg via ORAL

## 2012-10-19 NOTE — Patient Instructions (Addendum)

## 2012-10-26 ENCOUNTER — Telehealth: Payer: Self-pay | Admitting: *Deleted

## 2012-10-26 ENCOUNTER — Other Ambulatory Visit (HOSPITAL_BASED_OUTPATIENT_CLINIC_OR_DEPARTMENT_OTHER): Payer: Medicare Other | Admitting: Lab

## 2012-10-26 DIAGNOSIS — C9000 Multiple myeloma not having achieved remission: Secondary | ICD-10-CM

## 2012-10-26 LAB — CBC WITH DIFFERENTIAL/PLATELET
Basophils Absolute: 0.1 10*3/uL (ref 0.0–0.1)
EOS%: 5.9 % (ref 0.0–7.0)
Eosinophils Absolute: 0.3 10*3/uL (ref 0.0–0.5)
HCT: 32.6 % — ABNORMAL LOW (ref 34.8–46.6)
HGB: 10.5 g/dL — ABNORMAL LOW (ref 11.6–15.9)
MCH: 32.4 pg (ref 25.1–34.0)
MCV: 100.6 fL (ref 79.5–101.0)
MONO%: 18.5 % — ABNORMAL HIGH (ref 0.0–14.0)
NEUT%: 66.7 % (ref 38.4–76.8)
Platelets: 150 10*3/uL (ref 145–400)

## 2012-10-26 LAB — BASIC METABOLIC PANEL (CC13)
CO2: 28 mEq/L (ref 22–29)
Calcium: 8.4 mg/dL (ref 8.4–10.4)
Chloride: 104 mEq/L (ref 98–107)
Creatinine: 0.7 mg/dL (ref 0.6–1.1)
Sodium: 138 mEq/L (ref 136–145)

## 2012-10-26 NOTE — Telephone Encounter (Signed)
Pt called to clarify her treatment schedule.  Reviewed schedule w/ her and she verbalized understanding.  She also asks if her Revlimid will be refilled soon? Informed Dr. Gaylyn Rong will refill after reviewing her lab work from today.  She verbalized understanding.

## 2012-10-28 LAB — PROTEIN ELECTROPHORESIS, SERUM
Albumin ELP: 59 % (ref 55.8–66.1)
Alpha-1-Globulin: 4.4 % (ref 2.9–4.9)
Beta 2: 2.7 % — ABNORMAL LOW (ref 3.2–6.5)
Beta Globulin: 6.1 % (ref 4.7–7.2)
Total Protein, Serum Electrophoresis: 6.9 g/dL (ref 6.0–8.3)

## 2012-10-28 LAB — KAPPA/LAMBDA LIGHT CHAINS
Kappa:Lambda Ratio: 3.97 — ABNORMAL HIGH (ref 0.26–1.65)
Lambda Free Lght Chn: 1.11 mg/dL (ref 0.57–2.63)

## 2012-10-29 ENCOUNTER — Other Ambulatory Visit: Payer: Self-pay | Admitting: *Deleted

## 2012-10-29 DIAGNOSIS — C9000 Multiple myeloma not having achieved remission: Secondary | ICD-10-CM

## 2012-10-29 MED ORDER — LENALIDOMIDE 15 MG PO CAPS: 15.0000 mg | ORAL_CAPSULE | Freq: Every day | ORAL | Status: DC

## 2012-11-03 ENCOUNTER — Other Ambulatory Visit: Payer: Self-pay | Admitting: Oncology

## 2012-11-03 DIAGNOSIS — C9000 Multiple myeloma not having achieved remission: Secondary | ICD-10-CM

## 2012-11-04 ENCOUNTER — Ambulatory Visit (HOSPITAL_BASED_OUTPATIENT_CLINIC_OR_DEPARTMENT_OTHER): Payer: Medicare Other

## 2012-11-04 ENCOUNTER — Other Ambulatory Visit: Payer: Medicare Other

## 2012-11-04 ENCOUNTER — Other Ambulatory Visit: Payer: Self-pay | Admitting: Oncology

## 2012-11-04 ENCOUNTER — Telehealth: Payer: Self-pay | Admitting: Oncology

## 2012-11-04 ENCOUNTER — Ambulatory Visit (HOSPITAL_BASED_OUTPATIENT_CLINIC_OR_DEPARTMENT_OTHER): Payer: Medicare Other | Admitting: Oncology

## 2012-11-04 ENCOUNTER — Encounter: Payer: Self-pay | Admitting: Oncology

## 2012-11-04 VITALS — BP 123/72 | HR 93 | Temp 97.4°F | Resp 19 | Ht 68.0 in | Wt 120.7 lb

## 2012-11-04 DIAGNOSIS — R609 Edema, unspecified: Secondary | ICD-10-CM

## 2012-11-04 DIAGNOSIS — C9000 Multiple myeloma not having achieved remission: Secondary | ICD-10-CM

## 2012-11-04 DIAGNOSIS — D649 Anemia, unspecified: Secondary | ICD-10-CM

## 2012-11-04 DIAGNOSIS — Z5112 Encounter for antineoplastic immunotherapy: Secondary | ICD-10-CM

## 2012-11-04 MED ORDER — ZOLEDRONIC ACID 4 MG/5ML IV CONC
3.5000 mg | Freq: Once | INTRAVENOUS | Status: AC
  Administered 2012-11-04: 3.5 mg via INTRAVENOUS
  Filled 2012-11-04: qty 4.38

## 2012-11-04 MED ORDER — ONDANSETRON HCL 8 MG PO TABS
8.0000 mg | ORAL_TABLET | Freq: Once | ORAL | Status: AC
  Administered 2012-11-04: 8 mg via ORAL

## 2012-11-04 MED ORDER — BORTEZOMIB CHEMO SQ INJECTION 3.5 MG (2.5MG/ML)
1.1700 mg/m2 | Freq: Once | INTRAMUSCULAR | Status: AC
Start: 2012-11-04 — End: 2012-11-04
  Administered 2012-11-04: 1.75 mg via SUBCUTANEOUS
  Filled 2012-11-04: qty 1.75

## 2012-11-04 NOTE — Progress Notes (Signed)
Per Clenton Pare use pts CBC labs from 10/26/12 and give chemo today.

## 2012-11-04 NOTE — Patient Instructions (Addendum)
[facility] Discharge Instructions for Patients Receiving Chemotherapy  Today you received the following chemotherapy agents Velcade and Zometa  To help prevent nausea and vomiting after your treatment, we encourage you to take your nausea medication as prescribed.  If you develop nausea and vomiting that is not controlled by your nausea medication, call the clinic. If it is after clinic hours your family physician or the after hours number for the clinic or go to the Emergency Department.   BELOW ARE SYMPTOMS THAT SHOULD BE REPORTED IMMEDIATELY:  *FEVER GREATER THAN 100.5 F  *CHILLS WITH OR WITHOUT FEVER  NAUSEA AND VOMITING THAT IS NOT CONTROLLED WITH YOUR NAUSEA MEDICATION  *UNUSUAL SHORTNESS OF BREATH  *UNUSUAL BRUISING OR BLEEDING  TENDERNESS IN MOUTH AND THROAT WITH OR WITHOUT PRESENCE OF ULCERS  *URINARY PROBLEMS  *BOWEL PROBLEMS  UNUSUAL RASH Items with * indicate a potential emergency and should be followed up as soon as possible.   Feel free to call the clinic you have any questions or concerns. The clinic phone number is [phone].   I have been informed and understand all the instructions given to me. I know to contact the clinic, my physician, or go to the Emergency Department if any problems should occur. I do not have any questions at this time, but understand that I may call the clinic during office hours   should I have any questions or need assistance in obtaining follow up care.    __________________________________________  _____________  __________ Signature of Patient or Authorized Representative            Date                   Time    __________________________________________ Nurse's Signature

## 2012-11-04 NOTE — Progress Notes (Signed)
[facility]  Telephone:(336) [phone] Fax:(336) [phone]   OFFICE PROGRESS NOTE   Cc:  Sanjuana Letters, MD  DIAGNOSIS: multiple myeloma, presented with lytic bone lesions, anemia (Hgb 9.3). Presenting IgG was 4,040 mg/dL on 45/40/9811 (IgA 35; IgM 34); kappa free light chain 34.4 mg/dL, lambda 9.14, kappa:lambda ratio of 34.75; SPEP with M-spike of 2.60.  Bone marrow biopsy on 07/03/2012 showed 49% plasma cell.  Normal classical cytogenetics; however, myeloma FISH panel showed 13q-.   PAST THERAPY:  Palliative radiation 20 Gy over 10 fractions between 07/14/2012 and 07/27/2012 to thoracic spine cord compression and symptomatic left scapula lytic lesion.   CURRENT THERAPY:  Started on1/27/2014 SQ Velcade once weekly, 3 weeks on, 1 weeks off; daily Revlimid d1-21, 7 days off; and Dexamethasone 40mg  PO weekly.   INTERVAL HISTORY: Shannon Rush 72 y.o. female returns for regular follow up with her husband for cycle #4, day #1 of chemo.  She reported that with dose reduction after the 1st cycle.  Her fatigue now is mild.  She is able to ambulate without cane or walker.  She has normal appetite and is independent of all activities of daily living.  Her back pain is minimal.  She only needs pain med about once daily.    Patient denies fever, anorexia, weight loss, headache, visual changes, confusion, drenching night sweats, palpable lymph node swelling, mucositis, odynophagia, dysphagia, nausea vomiting, jaundice, chest pain, palpitation, shortness of breath, dyspnea on exertion, productive cough, gum bleeding, epistaxis, hematemesis, hemoptysis, abdominal pain, abdominal swelling, early satiety, melena, hematochezia, hematuria, skin rash, spontaneous bleeding,heat or cold intolerance, bowel bladder incontinence, focal motor weakness, paresthesia, depression.      Past Medical History  Diagnosis Date  . Multiple myeloma 07/01/2012  . Unspecified deficiency anemia   .  OSTEOPOROSIS 06/11/2010    Multiple compression fractures; and spontaneous fracture of sternum Qualifier: Diagnosis of  By: Humberto Seals NP, Darl Pikes     . 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 Cord compression 07/13/12    MRI- diffuse myeloma involvement of T-L spine  . History of radiation therapy 07/13/12-07/27/12    spinal cord compression T3-T10,left scapula    Past Surgical History  Procedure Laterality Date  . Appendectomy    . Tubal ligation    . Cesarean section      x2   . Hip surgery  2009    left  . Elbow surgery    . Colonoscopy  2007    neg with Dr. Ewing Schlein    Current Outpatient Prescriptions  Medication Sig Dispense Refill  . acyclovir (ZOVIRAX) 200 MG capsule Take 2 capsules (400 mg total) by mouth daily.  30 capsule  3  . dexamethasone (DECADRON) 4 MG tablet Take 5 tablets (20 mg total) by mouth once a week.      . diphenoxylate-atropine (LOMOTIL) 2.5-0.025 MG per tablet Take 2 tablets by mouth 4 (four) times daily as needed for diarrhea or loose stools.  60 tablet  0  . docusate sodium (COLACE) 100 MG capsule Take 100 mg by mouth 2 (two) times daily as needed.       . enoxaparin (LOVENOX) 40 MG/0.4ML injection Inject 0.4 mLs (40 mg total) into the skin daily.  30 Syringe  3  . HYDROcodone-acetaminophen (NORCO) 5-325 MG per tablet Take 1 tablet by mouth every 6 (six) hours as needed for pain.  90 tablet  0  . lenalidomide (REVLIMID) 15 MG capsule Take 1 capsule (  15 mg total) by mouth daily. Take 15 mg daily for 21 days and 7 days off.  21 capsule  0  . morphine (MS CONTIN) 15 MG 12 hr tablet Take 1 tablet (15 mg total) by mouth 2 (two) times daily.  60 tablet  0  . ondansetron (ZOFRAN) 4 MG tablet Take 1 tablet (4 mg total) by mouth every 8 (eight) hours as needed.  30 tablet  3   No current facility-administered medications for this visit.    ALLERGIES:  has No Known Allergies.  REVIEW OF SYSTEMS:  The rest of the 14-point review of system was negative.   Filed  Vitals:   11/04/12 1504  BP: 123/72  Pulse: 93  Temp: 97.4 F (36.3 C)  Resp: 19   Wt Readings from Last 3 Encounters:  11/04/12 120 lb 11.2 oz (54.749 kg)  10/05/12 120 lb 11.2 oz (54.749 kg)  09/07/12 123 lb 3 oz (55.877 kg)   ECOG Performance status: 1  PHYSICAL EXAMINATION:   General:  thin appearing woman, less tired appearing than last week, in no acute distress.  Eyes:  no scleral icterus.  ENT:  There were no oropharyngeal lesions.  Neck was without thyromegaly.  Lymphatics:  Negative cervical, supraclavicular or axillary adenopathy.  Respiratory: lungs were clear bilaterally without wheezing or crackles.  Cardiovascular:  Regular rate and rhythm, S1/S2, without murmur, rub or gallop.  There was 1-2+ pidding pedal edema without calf/thigh pain or palpable cords.  GI:  abdomen was soft, flat, nontender, nondistended, without organomegaly.  Muscoloskeletal: palpation and percussion of right upper back did not result in any pain.  Skin exam was without echymosis, petichae. Neuro exam was nonfocal.  Patient was able to get on and off exam table without any assistance.  Gait was measured.  Patient was alerted and oriented.  Attention was good.   Language was appropriate.  Mood was normal without depression.  Speech was not pressured.  Thought content was not tangential.     LABORATORY/RADIOLOGY DATA:  Lab Results  Component Value Date   WBC 5.3 10/26/2012   HGB 10.5* 10/26/2012   HCT 32.6* 10/26/2012   PLT 150 10/26/2012   GLUCOSE 89 10/26/2012   CHOL 207* 06/02/2009   TRIG 68 06/02/2009   HDL 73 06/02/2009   LDLCALC 120* 06/02/2009   ALKPHOS 111 09/28/2012   ALT 27 09/28/2012   AST 22 09/28/2012   NA 138 10/26/2012   K 3.8 10/26/2012   CL 104 10/26/2012   CREATININE 0.7 10/26/2012   BUN 14.2 10/26/2012   CO2 28 10/26/2012   M-spike 0.96 (was 2.6 at diagnosis). Serum free kappa light chain 4.41 (was 34.4 at diagnosis). Serum kappa:lambda ratio:  3.97 (was 34.75 at diagnosis).  IgG  level: 1430 (was 4040 at diagnosis).  ASSESSMENT AND PLAN:    1. Diagnosis: IgG kappa multiple myeloma:  2. Treatment: Velcade-Revlimid-dexamethasone. 3. Status: -Continue to have response with chemotherapy with decrease in M spike - Continue cycle #4. Per [facility], she will need a bone marrow biopsy after cycle 4. Will arrange this for 11/17/12.  4.  continue to the following supportive care medications:  * Acyclovir 400mg  PO daily to prevent risk of viral reactivation.  * Lovenox injection 40mg  subcutaneous one a day to prevent blood clot.  5.    Pain control:  Using Hydrocodone once a day.   6.    Anemia:  Slightly worsened due to treatment.  There is not active bleeding.  There  is no need for transfusion today.  CBC weekly with Velcade.  7.  Pedal edema:  Due to dexamethasone.  There was low clinical suspicion for DVT. This is stable and improves with leg elevation.  I advised again leg elevation at night and defer Lasix if it worsens.   8.  Follow up: Weekly for Velcade. Bone marrow biopsy 11/17/12. She has follow-up at [facility] on 12/10[facility]12. I have advised her to follow-up with [place] on 5/21, but she will be out of town. Will make arrangements for f/u when she has bone marrow biopsy.    The length of time of the face-to-face encounter was 15  minutes. More than 50% of time was spent counseling and coordination of care.

## 2012-11-08 ENCOUNTER — Other Ambulatory Visit: Payer: Self-pay | Admitting: Oncology

## 2012-11-10 ENCOUNTER — Telehealth: Payer: Self-pay | Admitting: *Deleted

## 2012-11-10 ENCOUNTER — Other Ambulatory Visit: Payer: Self-pay | Admitting: Oncology

## 2012-11-10 DIAGNOSIS — C9000 Multiple myeloma not having achieved remission: Secondary | ICD-10-CM

## 2012-11-10 MED ORDER — ACYCLOVIR 200 MG PO CAPS: 400.0000 mg | ORAL_CAPSULE | Freq: Every day | ORAL | Status: DC

## 2012-11-10 NOTE — Telephone Encounter (Signed)
Please refill with 2 additional refills.  Thanks

## 2012-11-10 NOTE — Telephone Encounter (Signed)
Informed pt of Acyclovir refilled.  She also had questions about when her Bone Marrow Biopsy is to be scheduled?  She reports next appt at [facility] is on 5/29 and wonders if the biopsy should be closer to that appt date but with enough time to have the results back?  She asks if she might have "better results" waiting another few weeks to have the biopsy done?

## 2012-11-10 NOTE — Telephone Encounter (Signed)
Pt called for refill on her acyclovir.  She ran out today.

## 2012-11-11 ENCOUNTER — Ambulatory Visit (HOSPITAL_BASED_OUTPATIENT_CLINIC_OR_DEPARTMENT_OTHER): Payer: Medicare Other

## 2012-11-11 ENCOUNTER — Other Ambulatory Visit: Payer: Self-pay | Admitting: Oncology

## 2012-11-11 ENCOUNTER — Other Ambulatory Visit (HOSPITAL_BASED_OUTPATIENT_CLINIC_OR_DEPARTMENT_OTHER): Payer: Medicare Other | Admitting: Lab

## 2012-11-11 ENCOUNTER — Telehealth: Payer: Self-pay | Admitting: Oncology

## 2012-11-11 ENCOUNTER — Telehealth: Payer: Self-pay | Admitting: *Deleted

## 2012-11-11 DIAGNOSIS — C9000 Multiple myeloma not having achieved remission: Secondary | ICD-10-CM

## 2012-11-11 DIAGNOSIS — Z5112 Encounter for antineoplastic immunotherapy: Secondary | ICD-10-CM

## 2012-11-11 LAB — CBC WITH DIFFERENTIAL/PLATELET
BASO%: 0.7 % (ref 0.0–2.0)
EOS%: 1.8 % (ref 0.0–7.0)
HCT: 30 % — ABNORMAL LOW (ref 34.8–46.6)
MCHC: 33.4 g/dL (ref 31.5–36.0)
MONO#: 0.3 10*3/uL (ref 0.1–0.9)
RBC: 2.98 10*6/uL — ABNORMAL LOW (ref 3.70–5.45)
RDW: 17.7 % — ABNORMAL HIGH (ref 11.2–14.5)
WBC: 3.4 10*3/uL — ABNORMAL LOW (ref 3.9–10.3)
lymph#: 0.5 10*3/uL — ABNORMAL LOW (ref 0.9–3.3)

## 2012-11-11 LAB — BASIC METABOLIC PANEL (CC13)
CO2: 25 mEq/L (ref 22–29)
Chloride: 107 mEq/L (ref 98–107)
Potassium: 3.4 mEq/L — ABNORMAL LOW (ref 3.5–5.1)
Sodium: 141 mEq/L (ref 136–145)

## 2012-11-11 MED ORDER — BORTEZOMIB CHEMO SQ INJECTION 3.5 MG (2.5MG/ML)
1.1700 mg/m2 | Freq: Once | INTRAMUSCULAR | Status: AC
  Administered 2012-11-11: 1.75 mg via SUBCUTANEOUS
  Filled 2012-11-11: qty 1.75

## 2012-11-11 MED ORDER — ONDANSETRON HCL 8 MG PO TABS
8.0000 mg | ORAL_TABLET | Freq: Once | ORAL | Status: AC
  Administered 2012-11-11: 8 mg via ORAL

## 2012-11-11 NOTE — Telephone Encounter (Signed)
Pt here for chemo today and she is not scheduled yet for any further office visits or Bone Marrow biopsy.  She wonders if she will get "better bone marrow results" if she can wait longer to have the procedure?  She has appt at [facility] on 5/29 so she knows it needs to be done prior to that date w/ enough time to get results.

## 2012-11-11 NOTE — Patient Instructions (Signed)
[facility] Discharge Instructions for Patients Receiving Chemotherapy  Today you received the following chemotherapy agents Velcade.  To help prevent nausea and vomiting after your treatment, we encourage you to take your nausea medication as prescribed.   If you develop nausea and vomiting that is not controlled by your nausea medication, call the clinic. If it is after clinic hours your family physician or the after hours number for the clinic or go to the Emergency Department.   BELOW ARE SYMPTOMS THAT SHOULD BE REPORTED IMMEDIATELY:  *FEVER GREATER THAN 100.5 F  *CHILLS WITH OR WITHOUT FEVER  NAUSEA AND VOMITING THAT IS NOT CONTROLLED WITH YOUR NAUSEA MEDICATION  *UNUSUAL SHORTNESS OF BREATH  *UNUSUAL BRUISING OR BLEEDING  TENDERNESS IN MOUTH AND THROAT WITH OR WITHOUT PRESENCE OF ULCERS  *URINARY PROBLEMS  *BOWEL PROBLEMS  UNUSUAL RASH Items with * indicate a potential emergency and should be followed up as soon as possible.  One of the nurses will contact you 24 hours after your treatment. Please let the nurse know about any problems that you may have experienced. Feel free to call the clinic you have any questions or concerns. The clinic phone number is [phone].   I have been informed and understand all the instructions given to me. I know to contact the clinic, my physician, or go to the Emergency Department if any problems should occur. I do not have any questions at this time, but understand that I may call the clinic during office hours   should I have any questions or need assistance in obtaining follow up care.    __________________________________________  _____________  __________ Signature of Patient or Authorized Representative            Date                   Time    __________________________________________ Nurse's Signature    

## 2012-11-11 NOTE — Telephone Encounter (Signed)
I've turned in a POF for her to get BM bx around 5/23; and to see me 5/28.  Please inform her.

## 2012-11-11 NOTE — Telephone Encounter (Signed)
S/w pt in infusion and informed Dr. Gaylyn Rong can do Bone Marrow Bx on 5/22 at 9 am.  Pt to come in at 8:30 for lab. Pt agreed.  POF sent to add on office visit for 5/28.

## 2012-11-12 ENCOUNTER — Telehealth: Payer: Self-pay | Admitting: Oncology

## 2012-11-12 ENCOUNTER — Other Ambulatory Visit: Payer: Self-pay | Admitting: Oncology

## 2012-11-12 ENCOUNTER — Ambulatory Visit: Payer: Medicare Other | Attending: Oncology | Admitting: Physical Therapy

## 2012-11-12 ENCOUNTER — Other Ambulatory Visit: Payer: Self-pay | Admitting: *Deleted

## 2012-11-12 DIAGNOSIS — M24519 Contracture, unspecified shoulder: Secondary | ICD-10-CM | POA: Insufficient documentation

## 2012-11-12 DIAGNOSIS — R5381 Other malaise: Secondary | ICD-10-CM | POA: Insufficient documentation

## 2012-11-12 DIAGNOSIS — IMO0001 Reserved for inherently not codable concepts without codable children: Secondary | ICD-10-CM | POA: Insufficient documentation

## 2012-11-13 ENCOUNTER — Ambulatory Visit: Payer: Medicare Other | Admitting: Oncology

## 2012-11-13 ENCOUNTER — Other Ambulatory Visit: Payer: Medicare Other

## 2012-11-13 ENCOUNTER — Telehealth: Payer: Self-pay | Admitting: Oncology

## 2012-11-17 ENCOUNTER — Telehealth: Payer: Self-pay | Admitting: *Deleted

## 2012-11-17 NOTE — Telephone Encounter (Signed)
PT. ASKED IF SHE CONTINUES THE REVLIMID AND LOVENOX. VERBAL ORDER AND READ BACK TO KRISTIN CURCIO,NP- PT. IS TO CONTINUE DEXAMETHASONE, REVLIMID, AND LOVENOX UNTIL THERE IS DIRECTION GIVEN FROM [facility] CONCERNING STEM CELL TRANSPLANT. ANY OTHER QUESTIONS CAN BE WRITTEN AND DISCUSSED ON 12/03/12 AT THE BONE MARROW BIOPSY PROCEDURE. NOTIFIED PT. VIA HOME VOICE MAIL.

## 2012-11-18 ENCOUNTER — Ambulatory Visit (HOSPITAL_BASED_OUTPATIENT_CLINIC_OR_DEPARTMENT_OTHER): Payer: Medicare Other

## 2012-11-18 ENCOUNTER — Other Ambulatory Visit: Payer: Self-pay | Admitting: Oncology

## 2012-11-18 ENCOUNTER — Other Ambulatory Visit (HOSPITAL_BASED_OUTPATIENT_CLINIC_OR_DEPARTMENT_OTHER): Payer: Medicare Other

## 2012-11-18 VITALS — BP 130/78 | HR 77 | Temp 98.6°F

## 2012-11-18 DIAGNOSIS — C9000 Multiple myeloma not having achieved remission: Secondary | ICD-10-CM

## 2012-11-18 DIAGNOSIS — Z5112 Encounter for antineoplastic immunotherapy: Secondary | ICD-10-CM

## 2012-11-18 LAB — CBC WITH DIFFERENTIAL/PLATELET
Basophils Absolute: 0.1 10*3/uL (ref 0.0–0.1)
Eosinophils Absolute: 0.2 10*3/uL (ref 0.0–0.5)
HGB: 10.9 g/dL — ABNORMAL LOW (ref 11.6–15.9)
MONO#: 0.7 10*3/uL (ref 0.1–0.9)
NEUT#: 3.2 10*3/uL (ref 1.5–6.5)
RDW: 17.4 % — ABNORMAL HIGH (ref 11.2–14.5)
lymph#: 0.5 10*3/uL — ABNORMAL LOW (ref 0.9–3.3)

## 2012-11-18 LAB — BASIC METABOLIC PANEL (CC13)
BUN: 12.8 mg/dL (ref 7.0–26.0)
Chloride: 104 mEq/L (ref 98–107)
Glucose: 127 mg/dl — ABNORMAL HIGH (ref 70–99)
Potassium: 3.6 mEq/L (ref 3.5–5.1)

## 2012-11-18 MED ORDER — BORTEZOMIB CHEMO SQ INJECTION 3.5 MG (2.5MG/ML)
1.1700 mg/m2 | Freq: Once | INTRAMUSCULAR | Status: AC
  Administered 2012-11-18: 1.75 mg via SUBCUTANEOUS
  Filled 2012-11-18: qty 1.75

## 2012-11-18 MED ORDER — ONDANSETRON HCL 8 MG PO TABS
8.0000 mg | ORAL_TABLET | Freq: Once | ORAL | Status: AC
  Administered 2012-11-18: 8 mg via ORAL

## 2012-11-18 NOTE — Patient Instructions (Addendum)

## 2012-11-19 NOTE — Progress Notes (Signed)
Faxed lab results from 11/19/11 to [facility] and Dr. Lillia Abed office.

## 2012-11-23 ENCOUNTER — Other Ambulatory Visit: Payer: Self-pay | Admitting: Oncology

## 2012-11-23 ENCOUNTER — Telehealth: Payer: Self-pay | Admitting: *Deleted

## 2012-11-23 NOTE — Telephone Encounter (Signed)
[name] moved to 11/26/12 at 8 am per Dr. Gaylyn Rong.  He was notified by [facility] of needing it sooner than 12/02/12 as scheduled.Shannon Rush

## 2012-11-23 NOTE — Telephone Encounter (Signed)
Left another Vm on home phone informing of BMBx scheduled at 8 am on 11/26/12.  Flow Cytometry has also been notified.  Asked pt to call back to confirm.

## 2012-11-25 ENCOUNTER — Other Ambulatory Visit: Payer: Medicare Other

## 2012-11-26 ENCOUNTER — Other Ambulatory Visit (HOSPITAL_BASED_OUTPATIENT_CLINIC_OR_DEPARTMENT_OTHER): Payer: Medicare Other

## 2012-11-26 ENCOUNTER — Other Ambulatory Visit: Payer: Self-pay | Admitting: Oncology

## 2012-11-26 ENCOUNTER — Ambulatory Visit (HOSPITAL_BASED_OUTPATIENT_CLINIC_OR_DEPARTMENT_OTHER): Payer: Medicare Other | Admitting: Oncology

## 2012-11-26 ENCOUNTER — Other Ambulatory Visit (HOSPITAL_COMMUNITY)
Admission: RE | Admit: 2012-11-26 | Discharge: 2012-11-26 | Disposition: A | Discharge status: Home / Self Care | Payer: Medicare Other | Source: Ambulatory Visit | Attending: Oncology | Admitting: Oncology

## 2012-11-26 VITALS — BP 162/88 | HR 64 | Temp 97.0°F | Resp 16

## 2012-11-26 DIAGNOSIS — C9000 Multiple myeloma not having achieved remission: Secondary | ICD-10-CM

## 2012-11-26 DIAGNOSIS — D72819 Decreased white blood cell count, unspecified: Secondary | ICD-10-CM | POA: Insufficient documentation

## 2012-11-26 DIAGNOSIS — D539 Nutritional anemia, unspecified: Secondary | ICD-10-CM | POA: Insufficient documentation

## 2012-11-26 DIAGNOSIS — D72822 Plasmacytosis: Secondary | ICD-10-CM | POA: Insufficient documentation

## 2012-11-26 LAB — COMPREHENSIVE METABOLIC PANEL (CC13)
AST: 30 U/L (ref 5–34)
BUN: 13.7 mg/dL (ref 7.0–26.0)
Calcium: 8.6 mg/dL (ref 8.4–10.4)
Chloride: 106 mEq/L (ref 98–107)
Creatinine: 0.7 mg/dL (ref 0.6–1.1)

## 2012-11-26 LAB — CBC WITH DIFFERENTIAL/PLATELET
Basophils Absolute: 0.1 10*3/uL (ref 0.0–0.1)
EOS%: 9.5 % — ABNORMAL HIGH (ref 0.0–7.0)
HCT: 32.3 % — ABNORMAL LOW (ref 34.8–46.6)
HGB: 10.7 g/dL — ABNORMAL LOW (ref 11.6–15.9)
MCH: 33.2 pg (ref 25.1–34.0)
MCV: 100.1 fL (ref 79.5–101.0)
MONO%: 13.6 % (ref 0.0–14.0)
NEUT%: 58.3 % (ref 38.4–76.8)

## 2012-11-26 LAB — BONE MARROW EXAM: Bone Marrow Exam: 301

## 2012-11-26 NOTE — Procedures (Signed)
   [facility]  Telephone:(336) [phone] Fax:(336) [phone]   BONE MARROW BIOPSY AND ASPIRATION   INDICATION:  Restaging bone marrow biopsy for myeloma.   Procedure: After obtained from consent, Shannon Rush was placed in the prone position. Time out was performed verifying correct patient and procedure. The skin overlying the left posterior crest was prepped with Betadine and draped in the usual sterile fashion. The skin and periosteum were infiltrated with 15 mL of 2% lidocaine. A small puncture wound was made with #11 scalpel blade.  Bone marrow aspirate was obtained on the first pass of the aspiration needle.  Two core biopsies were obtained through the same incision as the first one was subcentimeter.    The aspirate was sent for routine histology, flow cytometry, FISH myeloma, and cytogenetics.  Core biopsies were sent for routine histology.     Roxy Horseman tolerated procedure well with minimal  blood loss and without immediate complication.   A sterile dressing was applied.   Jethro Bolus M.D. 11/26/2012

## 2012-11-26 NOTE — Patient Instructions (Addendum)
Bone Marrow Aspiration, Bone Marrow Biopsy  Care After  Read the instructions outlined below and refer to this sheet in the next few weeks. These discharge instructions provide you with general information on caring for yourself after you leave the hospital. Your caregiver may also give you specific instructions. While your treatment has been planned according to the most current medical practices available, unavoidable complications occasionally occur. If you have any problems or questions after discharge, call your caregiver.  FINDING OUT THE RESULTS OF YOUR TEST  Not all test results are available during your visit. If your test results are not back during the visit, make an appointment with your caregiver to find out the results. Do not assume everything is normal if you have not heard from your caregiver or the medical facility. It is important for you to follow up on all of your test results.   HOME CARE INSTRUCTIONS   You have had sedation and may be sleepy or dizzy. Your thinking may not be as clear as usual. For the next 24 hours:   Only take over-the-counter or prescription medicines for pain, discomfort, and or fever as directed by your caregiver.   Do not drink alcohol.   Do not smoke.   Do not drive.   Do not make important legal decisions.   Do not operate heavy machinery.   Do not care for small children by yourself.   Keep your dressing clean and dry. You may replace dressing with a bandage after 24 hours.   You may take a bath or shower after 24 hours.   Use an ice pack for 20 minutes every 2 hours while awake for pain as needed.  SEEK MEDICAL CARE IF:    There is redness, swelling, or increasing pain at the biopsy site.   There is pus coming from the biopsy site.   There is drainage from a biopsy site lasting longer than one day.   An unexplained oral temperature above 102 F (38.9 C) develops.  SEEK IMMEDIATE MEDICAL CARE IF:    You develop a rash.   You have difficulty  breathing.   You develop any reaction or side effects to medications given.  Document Released: 01/18/2005 Document Revised: 09/23/2011 Document Reviewed: 06/28/2008  ExitCare Patient Information 2013 [employer].

## 2012-11-26 NOTE — Progress Notes (Signed)
Pt observed post bone marrow biopsy for 30 minutes. Sterile dry dressing intact. Pt denies complaints or needs at this time. Pt discharged alert and ambulatory.

## 2012-11-27 ENCOUNTER — Telehealth: Payer: Self-pay | Admitting: *Deleted

## 2012-11-27 NOTE — Telephone Encounter (Signed)
THIS REFILL REQUEST FOR REVLIMID WAS PLACED IN DR.HA'S ACTIVE WORK FOLDER. 

## 2012-11-27 NOTE — Telephone Encounter (Signed)
WRITTEN AND VERBAL ORDER READ BACK TO DR.HA- PLACE REVLIMID ON HOLD. AWAITING A DECISION FROM [facility] CONCERNING A BONE MARROW TRANSPLANT. CALLED AND FAXED ORDER TO [facility]. PHONE #[phone]/ FAX #[phone].

## 2012-11-30 LAB — SPEP & IFE WITH QIG
Alpha-2-Globulin: 9.5 % (ref 7.1–11.8)
Beta 2: 2.8 % — ABNORMAL LOW (ref 3.2–6.5)
Beta Globulin: 6.6 % (ref 4.7–7.2)
Gamma Globulin: 18.3 % (ref 11.1–18.8)
IgG (Immunoglobin G), Serum: 1230 mg/dL (ref 690–1700)
M-Spike, %: 0.9 g/dL

## 2012-11-30 LAB — KAPPA/LAMBDA LIGHT CHAINS: Kappa:Lambda Ratio: 1.69 — ABNORMAL HIGH (ref 0.26–1.65)

## 2012-12-01 ENCOUNTER — Other Ambulatory Visit: Payer: Self-pay | Admitting: Oncology

## 2012-12-01 NOTE — Progress Notes (Signed)
Bone marrow biopsy results faxed to Dr. Barbaraann Boys.

## 2012-12-02 ENCOUNTER — Ambulatory Visit: Payer: Medicare Other

## 2012-12-02 ENCOUNTER — Telehealth: Payer: Self-pay | Admitting: *Deleted

## 2012-12-02 ENCOUNTER — Other Ambulatory Visit: Payer: Self-pay | Admitting: Oncology

## 2012-12-02 NOTE — Telephone Encounter (Signed)
Left VM for pt on cell phone to return nurse's call.  Faxed BMBx results from May 15 and last Dec to Dr. Lillia Abed office at fax#[phone] per Dr. Lodema Pilot request.

## 2012-12-02 NOTE — Telephone Encounter (Signed)
Message copied by Wende Mott on Wed Dec 02, 2012 10:00 AM ------      Message from: HA, Raliegh Ip T      Created: Tue Dec 01, 2012  1:22 AM      Regarding: Result of BM bx       Please call patient and inform her that the result of BM bx last week showed 8% plasma cell.  This is a significant improvement from before (49% at diagnosis in 06/2012).            Please fax result of BM bx last week to Dr. Mellody Memos from [place].  She will decide whether patient will need additional chemo or proceed with BMT now.            Thanks. ------

## 2012-12-03 ENCOUNTER — Other Ambulatory Visit: Payer: Medicare Other

## 2012-12-03 ENCOUNTER — Telehealth: Payer: Self-pay | Admitting: *Deleted

## 2012-12-03 NOTE — Telephone Encounter (Signed)
Informed pt of Dr. Lodema Pilot message that her BMBx showed significant improvement with only 8% plasma cells.  Instructed to keep her appt at [facility] on 5/29 as scheduled.  She verbalized understanding.

## 2012-12-09 ENCOUNTER — Other Ambulatory Visit: Payer: Medicare Other | Admitting: Lab

## 2012-12-09 ENCOUNTER — Ambulatory Visit: Payer: Medicare Other

## 2012-12-09 ENCOUNTER — Ambulatory Visit: Payer: Medicare Other | Admitting: Oncology

## 2012-12-11 ENCOUNTER — Telehealth: Payer: Self-pay | Admitting: *Deleted

## 2012-12-11 NOTE — Telephone Encounter (Signed)
Pt left VM states went to [facility] yesterday for "work up" but did not see Dr. Barbaraann Boys.  Pt asks when she will know if she has been accepted for a Stem cell Transplant? Should she be on zometa?  Is she supposed to re start her chemo and if so, then when?  Left VM for Vinnie Langton, Bone marrow tranplant coordinator at [facility] w/ these questions.

## 2012-12-11 NOTE — Telephone Encounter (Signed)
Informed pt waiting to hear back from [facility] so we can plan course of action.  She verbalized understanding.  She is also requesting a Rx from Dr. Gaylyn Rong for Physical Therapy to Dr. Clyda Hurdle at fax [phone].

## 2012-12-14 ENCOUNTER — Encounter: Payer: Self-pay | Admitting: *Deleted

## 2012-12-14 NOTE — Progress Notes (Signed)
Faxed referral for Physical Therapy to Dr. Clyda Hurdle at fax [phone], per pt request.  PHT for chronic back pain and limited mobility due to  Multiple myeloma, compression fractures, osteoporosis, DJD and sciatica.

## 2012-12-16 ENCOUNTER — Ambulatory Visit: Payer: Medicare Other

## 2012-12-16 ENCOUNTER — Telehealth: Payer: Self-pay | Admitting: *Deleted

## 2012-12-17 ENCOUNTER — Other Ambulatory Visit: Payer: Self-pay | Admitting: Oncology

## 2012-12-17 ENCOUNTER — Telehealth: Payer: Self-pay | Admitting: Oncology

## 2012-12-17 DIAGNOSIS — C9000 Multiple myeloma not having achieved remission: Secondary | ICD-10-CM

## 2012-12-17 NOTE — Telephone Encounter (Signed)
VM received from Vinnie Langton, BMT coordinator at [facility] with Dr. [name]. She reports abnormal Echo and Dr. Reece Agar requests referral to Cardiologist in [place].  Forwarded Echo report to Dr. Gaylyn Rong and he made referral to [facility].

## 2012-12-17 NOTE — Telephone Encounter (Signed)
Appt w/ Dr. Eden Emms made for tomorrow.  Left VM on pt's home and cell phone to return call so we can give her appt information.

## 2012-12-17 NOTE — Telephone Encounter (Signed)
Faxed Echo report from [facility] to Dr. Eden Emms at fax [phone].

## 2012-12-17 NOTE — Telephone Encounter (Signed)
Pt returned call and confirmed appt w/ Dr. Eden Emms for tomorrow at 11:15 am.  Gave her ph# to their office to contact if needed.  She verbalized understanding.

## 2012-12-18 ENCOUNTER — Telehealth: Payer: Self-pay | Admitting: Cardiovascular Disease

## 2012-12-18 ENCOUNTER — Other Ambulatory Visit: Payer: Self-pay | Admitting: Oncology

## 2012-12-18 ENCOUNTER — Telehealth: Payer: Self-pay

## 2012-12-18 ENCOUNTER — Ambulatory Visit (INDEPENDENT_AMBULATORY_CARE_PROVIDER_SITE_OTHER): Payer: Medicare Other | Admitting: Cardiovascular Disease

## 2012-12-18 ENCOUNTER — Encounter: Payer: Self-pay | Admitting: Cardiovascular Disease

## 2012-12-18 ENCOUNTER — Encounter: Payer: Self-pay | Admitting: Oncology

## 2012-12-18 VITALS — BP 141/88 | HR 80 | Ht 68.0 in | Wt 126.0 lb

## 2012-12-18 DIAGNOSIS — R9389 Abnormal findings on diagnostic imaging of other specified body structures: Secondary | ICD-10-CM

## 2012-12-18 DIAGNOSIS — R931 Abnormal findings on diagnostic imaging of heart and coronary circulation: Secondary | ICD-10-CM | POA: Insufficient documentation

## 2012-12-18 NOTE — Telephone Encounter (Signed)
ROI faxed to [facility] at [phone] 12/18/12/KM

## 2012-12-18 NOTE — Telephone Encounter (Signed)
**Note De-Identified November Sypher Obfuscation** Release form completed by pt. and faxed to [facility] at [phone] [facility] Echo lab to get pts Echo disk and images.

## 2012-12-18 NOTE — Progress Notes (Signed)
Patient ID: Shannon Rush, female   DOB: 16-Nov-1940, 72 y.o.   MRN: [id number] 72 yo referred for cardiac clearance before bone marrow transplant Unfortunately her w/u has been at [facility] The patient accessed her MyChart.  Issue appears to be abnormal Echo.  Read report from 5/29  EF normal valves ok  " linear mobile density in aortic root above aortic valve.  She has not had any cardiac symptoms  She has not had any TIA;s  No clinical history of SBE, fevers.  No history of connective tissue disease.  She has been Rx for myeloma and is thinking about a BMT for this.  She has no chest pain dyspnea or palpitation No history of vascular disease aneurysm or hypercoagulable state  ROS: Denies fever, malais, weight loss, blurry vision, decreased visual acuity, cough, sputum, SOB, hemoptysis, pleuritic pain, palpitaitons, heartburn, abdominal pain, melena, lower extremity edema, claudication, or rash.  All other systems reviewed and negative   General: Affect appropriate Thin elderly female HEENT: normal Neck supple with no adenopathy JVP normal no bruits no thyromegaly Lungs clear with no wheezing and good diaphragmatic motion Heart:  S1/S2 no murmur,rub, gallop or click PMI normal Abdomen: benighn, BS positve, no tenderness, no AAA no bruit.  No HSM or HJR Distal pulses intact with no bruits No edema Neuro non-focal Skin warm and dry No muscular weakness  Medications Current Outpatient Prescriptions  Medication Sig Dispense Refill  . acyclovir (ZOVIRAX) 200 MG capsule Take 2 capsules (400 mg total) by mouth daily.  60 capsule  2  . HYDROcodone-acetaminophen (NORCO) 5-325 MG per tablet Take 1 tablet by mouth every 6 (six) hours as needed for pain.  90 tablet  0   No current facility-administered medications for this visit.    Allergies Review of patient's allergies indicates no known allergies.  Family History: Family History  Problem Relation Age of Onset  . Glaucoma Mother   . Heart  disease Mother   . Heart disease Father   . Cancer Brother     stage IV prostate CA    Social History: History   Social History  . Marital Status: Married    Spouse Name: [name]    Number of Children: 2  . Years of Education: N/A   Occupational History  . Retired-Pyschology Faculty [employer]   Social History Main Topics  . Smoking status: Never Smoker   . Smokeless tobacco: Never Used  . Alcohol Use: 3.5 oz/week    7 drink(s) per week  . Drug Use: No  . Sexually Active: Not on file   Other Topics Concern  . Not on file   Social History Narrative   Health Care POA:    Emergency Contact: spouse, Ainsleigh Kakos [phone]   End of Life Plan:    Who lives with you: spouse, 2 story home   Any pets: Dog, "AKU"   Diet: Patient has a varied diet of protein, vegetables, starches.  Limits red meats.   Exercise: Patient exercises atleast 3 times a week for over an hour.   Seatbelts: Patient reports wearing her seatbelt when in vehicle.    Wynelle Link Exposure/Protection: Patient reports wearing sunscreen daily.   Hobbies: Basketball, gardening, swimming, reading, biking          Electrocardiogram:  07/13/12  SR inferior T wave change  Assessment and Plan

## 2012-12-18 NOTE — Telephone Encounter (Signed)
New Problem  Pt was seen today, and the doctor wants her to have a TEE and she wants to know if she should set up a TEE now.

## 2012-12-18 NOTE — Telephone Encounter (Signed)
Pt is concerned that the disk from her Echo will not arrive in enough time for her have TEE before her 6/19 appt with her oncologist for bone marrow transplant. Spoke with Dr. Eden Emms and he states he will ask Dr. Shirlee Latch to review pts Echo disk with images when it arrives from [facility] as Dr Eden Emms will be on vacation next week. Pt states that she will speak with her oncologist about whether she should wait until her disk is reviewed by Dr. Shirlee Latch or go back to [facility] where she had original Echo done. Pt states that if she decides to have TEE at [facility] she will call office to let us know.

## 2012-12-18 NOTE — Assessment & Plan Note (Addendum)
Unfortunately I need to see the images to advise the patient  Suspect she may have lippman sachs type excresence Usually in patient like this we recommend ASA and Plavix.  The logical f/u test for this is a TEE.  She is not sure what she wants to do and rightly was upset that the echo report and images were not available to look at today. Outside of embolic risk I dont see an issue with BMT.  We have contacted [facility] to get images/disc  She will call us if she wants to set up TEE

## 2012-12-18 NOTE — Telephone Encounter (Signed)
Pt states that she is having TEE done at [facility] on Tuesday because she wants to be sure everything is in place for her bone marrow transplant. She expressed gratitude towards Dr. Eden Emms and the staff here.

## 2012-12-18 NOTE — Telephone Encounter (Signed)
Follow  Up   Pt states that they are going to go to [facility]. She said thank you for all you have done. She said to call her back when you have a chance.

## 2012-12-24 DIAGNOSIS — R9431 Abnormal electrocardiogram [ECG] [EKG]: Secondary | ICD-10-CM | POA: Insufficient documentation

## 2012-12-24 DIAGNOSIS — R222 Localized swelling, mass and lump, trunk: Secondary | ICD-10-CM | POA: Insufficient documentation

## 2012-12-25 ENCOUNTER — Telehealth: Payer: Self-pay | Admitting: *Deleted

## 2012-12-28 NOTE — Telephone Encounter (Signed)
Opened chart in error.

## 2012-12-29 ENCOUNTER — Encounter: Payer: Self-pay | Admitting: Oncology

## 2013-01-04 ENCOUNTER — Telehealth: Payer: Self-pay | Admitting: *Deleted

## 2013-01-04 NOTE — Telephone Encounter (Signed)
VM left for Vinnie Langton, Bone Marrow Transplant Coordinator at [facility],  Requesting return call to discuss treatment recommendations pending upcoming BMT scheduled for August.  Waiting for return call.

## 2013-01-05 ENCOUNTER — Telehealth: Payer: Self-pay | Admitting: *Deleted

## 2013-01-05 NOTE — Telephone Encounter (Signed)
Transplant Coordinator at [facility] left VM informing that Pt is having her stem cells harvested this week. They are ready to proceed with transplant but pt has chosen to go on a family vacation first.  She suggests Dr. Gaylyn Rong call Dr. Barbaraann Boys to discuss treatment plan.  Dr. Barbaraann Boys can be reached by calling the pager service at [phone].

## 2013-01-06 ENCOUNTER — Telehealth: Payer: Self-pay | Admitting: *Deleted

## 2013-01-06 ENCOUNTER — Other Ambulatory Visit: Payer: Self-pay | Admitting: Oncology

## 2013-01-06 NOTE — Telephone Encounter (Signed)
Pt called to clarify her treatment plan w/ Dr. Gaylyn Rong.  She is having her stem cells harvested at [facility] today.  She will be back in [place] this afternoon through July 10.  She is going on a family Vacation for 2 weeks from July 11 thru July 27.  She returns to [facility] on 7/30 for tests, 8/04 for [facility] placement and then starts chemo for transplant on 8/05.  She is not willing to forego her family vacation but is willing to do chemo while she is in town if Dr. Gaylyn Rong thinks that is best.  Informed Dr. Gaylyn Rong of above.  Since pt will only be available for the next 2 weeks,  It is not feasible to start another cycle of chemo prior to her transplant.  He instructs for pt to f/u w/ [place] after her Transplant.   Informed pt of Dr. Lodema Pilot response.  Instructed her to call us for any questions/ concerns/ updates as needed.  She verbalized understanding.

## 2013-01-12 ENCOUNTER — Other Ambulatory Visit: Payer: Self-pay | Admitting: Oncology

## 2013-01-12 ENCOUNTER — Telehealth: Payer: Self-pay | Admitting: *Deleted

## 2013-01-12 DIAGNOSIS — C9000 Multiple myeloma not having achieved remission: Secondary | ICD-10-CM

## 2013-01-12 NOTE — Telephone Encounter (Signed)
Please remind her to call a few days before so that Belenda Cruise can order them.  I cannot order tests to be done past 01/29/13.

## 2013-01-12 NOTE — Telephone Encounter (Signed)
Left pt VM informing her that orders for EKG and CXR were done by [facility].  Expect a call to schedule these appts and remember to have Radiology put the Xray on a disc and get copy of EKG to take w/ her to [facility].   Please call back if any questions.

## 2013-01-12 NOTE — Telephone Encounter (Signed)
Pt states needs to have EKG and CXR done at [facility] on 02/08/13.   She was told by nurse at [facility] that she could have these tests done here if Dr. Gaylyn Rong will order them and she needs to bring the reports/disc back w/ her to [facility] the following day.   Pt requesting tests at [facility] so she does not have to drive to [facility] that day.

## 2013-01-14 ENCOUNTER — Telehealth: Payer: Self-pay | Admitting: Oncology

## 2013-01-14 NOTE — Telephone Encounter (Signed)
, °

## 2013-02-03 ENCOUNTER — Telehealth: Payer: Self-pay | Admitting: Cardiovascular Disease

## 2013-02-03 NOTE — Telephone Encounter (Signed)
CD Of Echo Received Via mail, Will Hold onto until Wynona Canes comes back in Office 02/03/13/KM

## 2013-02-08 ENCOUNTER — Encounter: Payer: Self-pay | Admitting: *Deleted

## 2013-02-08 ENCOUNTER — Ambulatory Visit (HOSPITAL_COMMUNITY)
Admission: RE | Admit: 2013-02-08 | Discharge: 2013-02-08 | Disposition: A | Discharge status: Home / Self Care | Payer: Medicare Other | Source: Ambulatory Visit | Attending: Oncology | Admitting: Oncology

## 2013-02-08 DIAGNOSIS — Z01818 Encounter for other preprocedural examination: Secondary | ICD-10-CM | POA: Insufficient documentation

## 2013-02-08 DIAGNOSIS — M8448XA Pathological fracture, other site, initial encounter for fracture: Secondary | ICD-10-CM | POA: Insufficient documentation

## 2013-02-08 DIAGNOSIS — M4000 Postural kyphosis, site unspecified: Secondary | ICD-10-CM | POA: Insufficient documentation

## 2013-02-08 DIAGNOSIS — C9000 Multiple myeloma not having achieved remission: Secondary | ICD-10-CM

## 2013-02-08 NOTE — Progress Notes (Signed)
EKG and CXR reports faxed to Dr. Barbaraann Boys at [facility] fax #[phone].

## 2013-02-11 ENCOUNTER — Encounter: Payer: Self-pay | Admitting: Family Medicine

## 2013-02-11 NOTE — Progress Notes (Signed)
Patient ID: Shannon Rush, female   DOB: 1940-10-20, 72 y.o.   MRN: [id number] Outside notes from [facility] will be scanned.  It sounds like she is doing well with treatments and feeling much better.  Notes indicate she will have "GCSF stem cell mobilization."

## 2013-03-05 DIAGNOSIS — Z949 Transplanted organ and tissue status, unspecified: Secondary | ICD-10-CM | POA: Insufficient documentation

## 2013-03-05 DIAGNOSIS — Z9484 Stem cells transplant status: Secondary | ICD-10-CM | POA: Insufficient documentation

## 2013-03-18 ENCOUNTER — Telehealth: Payer: Self-pay | Admitting: *Deleted

## 2013-03-18 DIAGNOSIS — C9000 Multiple myeloma not having achieved remission: Secondary | ICD-10-CM

## 2013-03-18 NOTE — Telephone Encounter (Signed)
Call from Jacklynn Lewis, NP at [facility] BMT unit.  Pt is 43 days out from transplant.  She is being d/c'd home from hospital today.  They request pt be seen here mid next week w/ lab (CBC, CMET, Mag) and then lab again the following week. She will fax [place] over notes and orders.  Her next f/u appt at [facility] is on 04/07/13.

## 2013-03-19 ENCOUNTER — Telehealth: Payer: Self-pay | Admitting: *Deleted

## 2013-03-19 NOTE — Telephone Encounter (Signed)
sw pt gv appts for 03/24/13 w/ labs @ 3:30p and ov @ 4pm. Also gv appt for labs on 03/31/13 @ 11:15am. Pt is aware...td

## 2013-03-24 ENCOUNTER — Encounter: Payer: Self-pay | Admitting: Internal Medicine

## 2013-03-24 ENCOUNTER — Other Ambulatory Visit: Payer: Self-pay | Admitting: Oncology

## 2013-03-24 ENCOUNTER — Ambulatory Visit (HOSPITAL_BASED_OUTPATIENT_CLINIC_OR_DEPARTMENT_OTHER): Payer: Medicare Other | Admitting: Internal Medicine

## 2013-03-24 ENCOUNTER — Other Ambulatory Visit (HOSPITAL_BASED_OUTPATIENT_CLINIC_OR_DEPARTMENT_OTHER): Payer: Medicare Other

## 2013-03-24 ENCOUNTER — Encounter: Payer: Self-pay | Admitting: Medical Oncology

## 2013-03-24 VITALS — BP 150/89 | HR 78 | Temp 97.0°F | Resp 17 | Ht 68.0 in | Wt 126.8 lb

## 2013-03-24 DIAGNOSIS — D539 Nutritional anemia, unspecified: Secondary | ICD-10-CM

## 2013-03-24 DIAGNOSIS — Z9484 Stem cells transplant status: Secondary | ICD-10-CM

## 2013-03-24 DIAGNOSIS — C9000 Multiple myeloma not having achieved remission: Secondary | ICD-10-CM

## 2013-03-24 LAB — COMPREHENSIVE METABOLIC PANEL (CC13)
Albumin: 2.7 g/dL — ABNORMAL LOW (ref 3.5–5.0)
Alkaline Phosphatase: 141 U/L (ref 40–150)
BUN: 14.6 mg/dL (ref 7.0–26.0)
Creatinine: 0.6 mg/dL (ref 0.6–1.1)
Glucose: 99 mg/dl (ref 70–140)
Total Bilirubin: 0.7 mg/dL (ref 0.20–1.20)

## 2013-03-24 LAB — CBC WITH DIFFERENTIAL/PLATELET
Basophils Absolute: 0 10*3/uL (ref 0.0–0.1)
EOS%: 0.1 % (ref 0.0–7.0)
HCT: 29.8 % — ABNORMAL LOW (ref 34.8–46.6)
HGB: 10.1 g/dL — ABNORMAL LOW (ref 11.6–15.9)
LYMPH%: 5.5 % — ABNORMAL LOW (ref 14.0–49.7)
MCH: 32.7 pg (ref 25.1–34.0)
MCV: 96.2 fL (ref 79.5–101.0)
MONO%: 12.6 % (ref 0.0–14.0)
NEUT%: 81.2 % — ABNORMAL HIGH (ref 38.4–76.8)

## 2013-03-24 MED ORDER — POTASSIUM CHLORIDE CRYS ER 20 MEQ PO TBCR: 20.0000 meq | EXTENDED_RELEASE_TABLET | Freq: Every day | ORAL | Status: DC

## 2013-03-24 NOTE — Patient Instructions (Addendum)
Hypokalemia Hypokalemia means a low potassium level in the blood.Potassium is an electrolyte that helps regulate the amount of fluid in the body. It also stimulates muscle contraction and maintains a stable acid-base balance.Most of the body's potassium is inside of cells, and only a very small amount is in the blood. Because the amount in the blood is so small, minor changes can have big effects. PREPARATION FOR TEST Testing for potassium requires taking a blood sample taken by needle from a vein in the arm. The skin is cleaned thoroughly before the sample is drawn. There is no other special preparation needed. NORMAL VALUES Potassium levels below 3.5 mEq/L are abnormally low. Levels above 5.1 mEq/L are abnormally high. Ranges for normal findings may vary among different laboratories and hospitals. You should always check with your doctor after having lab work or other tests done to discuss the meaning of your test results and whether your values are considered within normal limits. MEANING OF TEST  Your caregiver will go over the test results with you and discuss the importance and meaning of your results, as well as treatment options and the need for additional tests, if necessary. A potassium level is frequently part of a routine medical exam. It is usually included as part of a whole "panel" of tests for several blood salts (such as Sodium and Chloride). It may be done as part of follow-up when a low potassium level was found in the past or other blood salts are suspected of being out of balance. A low potassium level might be suspected if you have one or more of the following:  Symptoms of weakness.  Abnormal heart rhythms.  High blood pressure and are taking medication to control this, especially water pills (diuretics).  Kidney disease that can affect your potassium level .  Diabetes requiring the use of insulin. The potassium may fall after taking insulin, especially if the diabetes  had been out of control for a while.  A condition requiring the use of cortisone-type medication or certain types of antibiotics.  Vomiting and/or diarrhea for more than a day or two.  A stomach or intestinal condition that may not permit appropriate absorption of potassium.  Fainting episodes.  Mental confusion. OBTAINING TEST RESULTS It is your responsibility to obtain your test results. Ask the lab or department performing the test when and how you will get your results.  Please contact your caregiver directly if you have not received the results within one week. At that time, ask if there is anything different or new you should be doing in relation to the results. TREATMENT Hypokalemia can be treated with potassium supplements taken by mouth and/or adjustments in your current medications. A diet high in potassium is also helpful. Foods with high potassium content are:  Peas, lentils, lima beans, nuts, and dried fruit.  Whole grain and bran cereals and breads.  Fresh fruit, vegetables (bananas, cantaloupe, grapefruit, oranges, tomatoes, honeydew melons, potatoes).  Orange and tomato juices.  Meats. If potassium supplement has been prescribed for you today or your medications have been adjusted, see your personal caregiver in time02 for a re-check. SEEK MEDICAL CARE IF:  There is a feeling of worsening weakness.  You experience repeated chest palpitations.  You are diabetic and having difficulty keeping your blood sugars in the normal range.  You are experiencing vomiting and/or diarrhea.  You are having difficulty with any of your regular medications. SEEK IMMEDIATE MEDICAL CARE IF:  You experience chest pain, shortness of   breath, or episodes of dizziness.  You have been having vomiting or diarrhea for more than 2 days.  You have a fainting episode. MAKE SURE YOU:   Understand these instructions.  Will watch your condition.  Will get help right away if you are  not doing well or get worse. Document Released: 07/01/2005 Document Revised: 09/23/2011 Document Reviewed: 06/11/2008 ExitCare Patient Information 2014 [employer].  

## 2013-03-25 ENCOUNTER — Telehealth: Payer: Self-pay | Admitting: Internal Medicine

## 2013-03-25 NOTE — Telephone Encounter (Signed)
, °

## 2013-03-28 ENCOUNTER — Other Ambulatory Visit: Payer: Self-pay | Admitting: Internal Medicine

## 2013-03-28 DIAGNOSIS — C9000 Multiple myeloma not having achieved remission: Secondary | ICD-10-CM

## 2013-03-28 NOTE — Progress Notes (Signed)
Hematology and Oncology Follow Up Visit  Shannon Rush [id number] 1941/04/14 72 y.o. 03/24/2013 10:30 pm Sanjuana Letters, MD  DIAGNOSIS: Multiple myeloma, presented with lytic bone lesions, anemia (Hgb 9.3). Presenting IgG was 4,040 mg/dL on 78/29/5621 (IgA 35; IgM 34); kappa free light chain 34.4 mg/dL, lambda 3.08, kappa:lambda ratio of 34.75; SPEP with M-spike of 2.60.  Bone marrow biopsy on 07/03/2012 showed 49% plasma cell.  Normal classical cytogenetics; however, myeloma FISH panel showed 13q- (intermediate risk)  PAST THERAPY:  Palliative radiation 20 Gy over 10 fractions between 07/14/2012 and 07/27/2012 to thoracic spine cord compression and symptomatic left scapula lytic lesion.   CURRENT THERAPY:  Started on1/27/2014 SQ Velcade once weekly, 3 weeks on, 1 weeks off; daily Revlimid d1-21, 7 days off; and Dexamethasone 40mg  PO weekly.  She received a auto bone marrow transplant (02/03/2013).  Today, she is Post transplant day #50.    Interim History: Shannon Rush 72 y.o. female returns for regular follow up with her husband.  She had a stem cell transplant at [facility] (Dr. Lyndel Safe).  Her course was complicated by fever of undertermied source.  Today she reports that her energy level is improving since transplant.  She is anxious to go bike riding.  Her appetite is somewhat diminished.   Her next follow up at [facility] will be on 09/24.    Patient denies visual changes, confusion, drenching night sweats, palpable lymph node swelling, mucositis, odynophagia, dysphagia, nausea vomiting, jaundice, chest pain, palpitation, productive cough, gum bleeding, epistaxis, hematemesis, hemoptysis, abdominal pain, abdominal swelling, early satiety, melena, hematochezia, hematuria, skin rash, spontaneous bleeding, joint swelling, joint pain, heat or cold intolerance, bowel bladder incontinence, focal motor weakness, paresthesia.   Medications: I have reviewed the patient's current medications.   Current Outpatient Prescriptions  Medication Sig Dispense Refill  . acyclovir (ZOVIRAX) 400 MG tablet Take by mouth. Take 400 mg by mouth daily.      Marland Kitchen HYDROcodone-acetaminophen (NORCO) 5-325 MG per tablet Take 1 tablet by mouth every 6 (six) hours as needed for pain.  90 tablet  0  . morphine (MS CONTIN) 15 MG 12 hr tablet Take by mouth. Take 15 mg by mouth every 12 (twelve) hours.      Marland Kitchen acyclovir (ZOVIRAX) 200 MG capsule TAKE 2 CAPSULES (400 MG TOTAL) BY MOUTH DAILY.  60 capsule  3  . potassium chloride SA (K-DUR,KLOR-CON) 20 MEQ tablet Take 1 tablet (20 mEq total) by mouth daily. Take one tablet daily for 4 days.  4 tablet  0   No current facility-administered medications for this visit.     Allergies:  Allergies  Allergen Reactions  . Zithromax [Azithromycin]     Full body rash  . Ciprofloxacin Rash    Throwing up and stomach pain     Past Medical History, Surgical history, Social history, and Family History were reviewed and updated.  Review of Systems: Constitutional:  Negative for fever, chills, night sweats, anorexia, weight loss, pain. Cardiovascular: no chest pain or dyspnea on exertion Respiratory: no cough, shortness of breath, or wheezing Neurological: no TIA or stroke symptoms Dermatological: negative for rash ENT: negative for - epistaxis or headaches Skin: Negative. Gastrointestinal: no abdominal pain, change in bowel habits, or black or bloody stools Genito-Urinary: no dysuria, trouble voiding, or hematuria Hematological and Lymphatic: negative for - bleeding problems or blood clots Breast: negative for breast lumps Musculoskeletal: negative for - gait disturbance Remaining ROS negative. Physical Exam: Blood pressure 150/89, pulse 78, temperature 97 F (  36.1 C), temperature source Oral, resp. rate 17, height 5\' 8"  (1.727 m), weight 126 lb 12.8 oz (57.516 kg). ECOG: 0 General appearance: alert, cooperative, appears stated age and no distress, thin,  alopecia Head: Normocephalic, without obvious abnormality, atraumatic Neck: no adenopathy, supple, symmetrical, trachea midline and thyroid not enlarged, symmetric, no tenderness/mass/nodules Lymph nodes: Cervical, supraclavicular, and axillary nodes normal. Heart:regular rate and rhythm, S1, S2 normal, no murmur, click, rub or gallop Lung:chest clear, no wheezing, rales, normal symmetric air entry,  Abdomin: soft, non-tender, without masses or organomegaly EXT: Trace peripheral edema   Lab Results: Lab Results  Component Value Date   WBC 6.6 03/24/2013   HGB 10.1* 03/24/2013   HCT 29.8* 03/24/2013   MCV 96.2 03/24/2013   PLT 167 03/24/2013     Chemistry      Component Value Date/Time   NA 137 03/24/2013 1539   NA 133* 05/08/2012 0922   K 2.9* 03/24/2013 1539   K 4.2 05/08/2012 0922   CL 106 11/26/2012 0936   CL 100 05/08/2012 0922   CO2 29 03/24/2013 1539   CO2 27 05/08/2012 0922   BUN 14.6 03/24/2013 1539   BUN 20 05/08/2012 0922   CREATININE 0.6 03/24/2013 1539   CREATININE 0.67 07/13/2012 1310   CREATININE 0.88 05/08/2012 0922   CREATININE 0.55 04/01/2011 0250      Component Value Date/Time   CALCIUM 8.3* 03/24/2013 1539   CALCIUM 9.4 05/08/2012 0922   ALKPHOS 141 03/24/2013 1539   ALKPHOS 77 05/08/2012 0922   AST 20 03/24/2013 1539   AST 21 05/08/2012 0922   ALT 31 03/24/2013 1539   ALT 15 05/08/2012 0922   BILITOT 0.70 03/24/2013 1539   BILITOT 0.6 05/08/2012 0922      Results for CHARLEENE, CALLEGARI (MRN [id number]) as of 03/28/2013 17:00  Ref. Range 09/28/2012 15:01 10/26/2012 14:53 11/26/2012 09:36  IgG (Immunoglobin G), Serum Latest Range: 561-244-4569 mg/dL  6213 0865  IgA Latest Range: 69-380 mg/dL  39 (L) 50 (L)  IgM, Serum Latest Range: 52-322 mg/dL  40 (L) 36 (L)  Total Protein, Serum Electrophoresis Latest Range: 6.0-8.3 g/dL 7.0 6.9 6.6  Kappa free light chain Latest Range: 0.33-1.94 mg/dL 7.84 (H) 6.96 (H) 2.95 (H)  Lambda Free Lght Chn Latest Range: 0.57-2.63 mg/dL 2.84  1.32 4.40  Kappa:Lambda Ratio Latest Range: 0.26-1.65  3.14 (H) 3.97 (H) 1.69 (H)   Radiological Studies: No results found.   Impression and Plan:  1.  S/p auto BMT:  Today's labs are reassuring. Repeat CBC, CMET, Magnesium in one week.  F/u with [facility] BMT (Dr. Carmell Austria on 04/07/2013)   -   Acyclovir 400mg  PO daily to prevent risk of viral reactivation. 2.  IgG kappa multiple myeloma: -   Continue close observation s/p #1. Monitor M-protein and kappa/lambda ratios  3.  Pain control:  Just on MS Contin once daily without breakthrough pain meds.   4.  Physical activity. - Gradual re-introduction into her exercise regiment, i.e., bicycling as tolerated.     5. Follow-up - RTC in 6 weeks with CBC, CMP and SPEP, KappaLambda chains.  Spent more than half the time coordinating care.    Tacy Chavis, MD 03/24/2013 10:30 pm

## 2013-03-29 ENCOUNTER — Telehealth: Payer: Self-pay

## 2013-03-29 NOTE — Telephone Encounter (Signed)
Pt called stating that she has a rash that [facility] prescribed hydrocortisone cream for. She states it is better but still itching. I explained that is an OTC cream. I educated if the rash gets worse or swollen or redder to call us back. She f/u wed at [facility] for lab and next wed at [facility] w/Dr Gasparetto.

## 2013-03-31 ENCOUNTER — Other Ambulatory Visit (HOSPITAL_BASED_OUTPATIENT_CLINIC_OR_DEPARTMENT_OTHER): Payer: Medicare Other

## 2013-03-31 DIAGNOSIS — C9000 Multiple myeloma not having achieved remission: Secondary | ICD-10-CM

## 2013-03-31 LAB — CBC WITH DIFFERENTIAL/PLATELET
Eosinophils Absolute: 0.1 10*3/uL (ref 0.0–0.5)
HCT: 28.6 % — ABNORMAL LOW (ref 34.8–46.6)
HGB: 9.9 g/dL — ABNORMAL LOW (ref 11.6–15.9)
LYMPH%: 4.7 % — ABNORMAL LOW (ref 14.0–49.7)
MONO#: 1 10*3/uL — ABNORMAL HIGH (ref 0.1–0.9)
NEUT#: 4.1 10*3/uL (ref 1.5–6.5)
Platelets: 198 10*3/uL (ref 145–400)
RBC: 2.94 10*6/uL — ABNORMAL LOW (ref 3.70–5.45)
WBC: 5.6 10*3/uL (ref 3.9–10.3)

## 2013-03-31 LAB — MAGNESIUM (CC13): Magnesium: 2 mg/dl (ref 1.5–2.5)

## 2013-03-31 LAB — COMPREHENSIVE METABOLIC PANEL (CC13)
Albumin: 2.4 g/dL — ABNORMAL LOW (ref 3.5–5.0)
CO2: 28 mEq/L (ref 22–29)
Glucose: 101 mg/dl (ref 70–140)
Sodium: 139 mEq/L (ref 136–145)
Total Bilirubin: 0.63 mg/dL (ref 0.20–1.20)
Total Protein: 6.2 g/dL — ABNORMAL LOW (ref 6.4–8.3)

## 2013-04-04 DIAGNOSIS — M810 Age-related osteoporosis without current pathological fracture: Secondary | ICD-10-CM | POA: Insufficient documentation

## 2013-04-07 ENCOUNTER — Other Ambulatory Visit: Payer: Self-pay | Admitting: Medical Oncology

## 2013-04-07 ENCOUNTER — Telehealth: Payer: Self-pay | Admitting: Medical Oncology

## 2013-04-07 DIAGNOSIS — C9000 Multiple myeloma not having achieved remission: Secondary | ICD-10-CM

## 2013-04-07 NOTE — Telephone Encounter (Signed)
Shannon Rush called from Dr. Lillia Abed office and left a message stating that pt needs to have bi-lateral dopplers of her lower legs. She was seen there today but the pt did not want to wait and wanted to come back to [place]. She will fax over the orders. Pt is having swelling in her lower legs. I spoke with Dr. Rosie Fate and he is aware of the order. Orders entered and POF made.

## 2013-04-09 ENCOUNTER — Ambulatory Visit (HOSPITAL_COMMUNITY)
Admission: RE | Admit: 2013-04-09 | Discharge: 2013-04-09 | Disposition: A | Discharge status: Home / Self Care | Payer: Medicare Other | Source: Ambulatory Visit | Attending: Family Medicine | Admitting: Family Medicine

## 2013-04-09 ENCOUNTER — Encounter (HOSPITAL_COMMUNITY): Payer: Medicare Other

## 2013-04-09 ENCOUNTER — Telehealth: Payer: Self-pay | Admitting: Internal Medicine

## 2013-04-09 DIAGNOSIS — M7989 Other specified soft tissue disorders: Secondary | ICD-10-CM

## 2013-04-09 DIAGNOSIS — C9000 Multiple myeloma not having achieved remission: Secondary | ICD-10-CM

## 2013-04-09 NOTE — Progress Notes (Signed)
*  Preliminary Results* Bilateral lower extremity venous duplex completed. Bilateral lower extremities are negative for deep vein thrombosis. There is no evidence of Baker's cyst bilaterally.  04/09/2013  Dolorez Jeffrey, RVT, RDCS, RDMS  

## 2013-04-09 NOTE — Telephone Encounter (Signed)
Desk nurse Amy M. S/w pt re doppler today @ [facility]. Pt has until 6pm to get and will go now. Cindy in vas lab made aware pt on her way.

## 2013-05-04 ENCOUNTER — Ambulatory Visit (HOSPITAL_BASED_OUTPATIENT_CLINIC_OR_DEPARTMENT_OTHER): Payer: Medicare Other | Admitting: Internal Medicine

## 2013-05-04 ENCOUNTER — Other Ambulatory Visit (HOSPITAL_BASED_OUTPATIENT_CLINIC_OR_DEPARTMENT_OTHER): Payer: Medicare Other

## 2013-05-04 VITALS — BP 118/74 | HR 94 | Temp 97.1°F | Resp 18 | Ht 68.0 in | Wt 128.5 lb

## 2013-05-04 DIAGNOSIS — M810 Age-related osteoporosis without current pathological fracture: Secondary | ICD-10-CM

## 2013-05-04 DIAGNOSIS — M4000 Postural kyphosis, site unspecified: Secondary | ICD-10-CM

## 2013-05-04 DIAGNOSIS — C9000 Multiple myeloma not having achieved remission: Secondary | ICD-10-CM

## 2013-05-04 DIAGNOSIS — D539 Nutritional anemia, unspecified: Secondary | ICD-10-CM

## 2013-05-04 DIAGNOSIS — E559 Vitamin D deficiency, unspecified: Secondary | ICD-10-CM

## 2013-05-04 LAB — CBC WITH DIFFERENTIAL/PLATELET
BASO%: 0.9 % (ref 0.0–2.0)
HCT: 30.9 % — ABNORMAL LOW (ref 34.8–46.6)
MCHC: 33.7 g/dL (ref 31.5–36.0)
MONO#: 0.4 10*3/uL (ref 0.1–0.9)
RBC: 2.95 10*6/uL — ABNORMAL LOW (ref 3.70–5.45)
RDW: 21.5 % — ABNORMAL HIGH (ref 11.2–14.5)
WBC: 5 10*3/uL (ref 3.9–10.3)
lymph#: 0.3 10*3/uL — ABNORMAL LOW (ref 0.9–3.3)

## 2013-05-04 LAB — COMPREHENSIVE METABOLIC PANEL (CC13)
ALT: 20 U/L (ref 0–55)
AST: 21 U/L (ref 5–34)
Alkaline Phosphatase: 81 U/L (ref 40–150)
Anion Gap: 9 mEq/L (ref 3–11)
Creatinine: 0.7 mg/dL (ref 0.6–1.1)
Total Bilirubin: 0.71 mg/dL (ref 0.20–1.20)

## 2013-05-04 LAB — MAGNESIUM (CC13): Magnesium: 2.3 mg/dl (ref 1.5–2.5)

## 2013-05-05 ENCOUNTER — Telehealth: Payer: Self-pay | Admitting: Internal Medicine

## 2013-05-05 DIAGNOSIS — E559 Vitamin D deficiency, unspecified: Secondary | ICD-10-CM | POA: Insufficient documentation

## 2013-05-05 NOTE — Telephone Encounter (Signed)
sw pt made aware of 12/03 appts per 10/21 POF shh

## 2013-05-05 NOTE — Progress Notes (Signed)
[facility] OFFICE PROGRESS NOTE  Shannon Letters, MD [address] [place] [address]  DIAGNOSIS: Multiple myeloma - Plan: CBC with Differential, Comprehensive metabolic panel, Kappa/lambda light chains, SPEP & IFE with QIG  Anemia, macrocytic  KYPHOSIS  OSTEOPOROSIS  Unspecified vitamin D deficiency  Chief Complaint  Patient presents with  . Multiple Myeloma    DIAGNOSIS: Multiple myeloma, presented with lytic bone lesions, anemia (Hgb 9.3). Presenting IgG was 4,040 mg/dL on 95/28/4132 (IgA 35; IgM 34); kappa free light chain 34.4 mg/dL, lambda 4.40, kappa:lambda ratio of 34.75; SPEP with M-spike of 2.60. Bone marrow biopsy on 07/03/2012 showed 49% plasma cell. Normal classical cytogenetics; however, myeloma FISH panel showed 13q- (intermediate risk)   PAST THERAPY: Palliative radiation 20 Gy over 10 fractions between 07/14/2012 and 07/27/2012 to thoracic spine cord compression and symptomatic left scapula lytic lesion.   CURRENT THERAPY: Started on1/27/2014 SQ Velcade once weekly, 3 weeks on, 1 weeks off; daily Revlimid d1-21, 7 days off; and Dexamethasone 40mg  PO weekly. She received a auto bone marrow transplant (02/12/2013). Today, she is Post transplant day #83.   INTERIM HISTORY: Shannon Rush 72 y.o. female returns for regular follow up with her husband. She had a stem cell transplant at [facility] (Dr. Lyndel Safe). We last saw here on 03/24/2013. Her course was complicated by fever of undertermied source. Today she reports that her energy level is improving since transplant. She has return to bike riding. Her appetite has also improved. Her next follow up at [facility] will be in a couple weeks.  She was last seen by Dr. Donnie Coffin on 04/07/2013.   Patient denies visual changes, confusion, drenching night sweats, palpable lymph node swelling, mucositis, odynophagia, dysphagia, nausea vomiting, jaundice, chest pain, palpitation, productive cough, gum  bleeding, epistaxis, hematemesis, hemoptysis, abdominal pain, abdominal swelling, early satiety, melena, hematochezia, hematuria, skin rash, spontaneous bleeding, joint swelling, joint pain, heat or cold intolerance, bowel bladder incontinence, focal motor weakness, paresthesia.   MEDICAL HISTORY: Past Medical History  Diagnosis Date  . Multiple myeloma 07/01/2012  . Unspecified deficiency anemia   . OSTEOPOROSIS 06/11/2010    Multiple compression fractures; and spontaneous fracture of sternum Qualifier: Diagnosis of  By: Humberto Seals NP, Darl Pikes     . 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 Cord compression 07/13/12    MRI- diffuse myeloma involvement of T-L spine  . History of radiation therapy 07/13/12-07/27/12    spinal cord compression T3-T10,left scapula    INTERIM HISTORY: has Anemia, macrocytic; DJD, UNSPECIFIED; OSTEOPOROSIS; KYPHOSIS; LUMBOSACRAL STRAIN; Sciatica of left side; Abdominal pain, acute, right upper quadrant; Thoracic back pain; Multiple myeloma; Cord compression; Abnormal echocardiogram; Swelling, mass, or lump in chest; Abnormal electrocardiogram; History of peripheral stem cell transplant; and Unspecified vitamin D deficiency on her problem list.    ALLERGIES:  is allergic to zithromax and ciprofloxacin.  MEDICATIONS: has a current medication list which includes the following prescription(s): acyclovir, calcium carbonate, morphine, vitamin d (ergocalciferol), and hydrocodone-acetaminophen.  SURGICAL HISTORY:  Past Surgical History  Procedure Laterality Date  . Appendectomy    . Tubal ligation    . Cesarean section      x2   . Hip surgery  2009    left  . Elbow surgery    . Colonoscopy  2007    neg with Dr. Ewing Schlein    REVIEW OF SYSTEMS:   Constitutional: Denies fevers, chills or abnormal weight loss Eyes: Denies blurriness of vision Ears, nose, mouth, throat,  and face: Denies mucositis or sore throat Respiratory: Denies cough, dyspnea or  wheezes Cardiovascular: Denies palpitation, chest discomfort or lower extremity swelling Gastrointestinal:  Denies nausea, heartburn or change in bowel habits Skin: Denies abnormal skin rashes Lymphatics: Denies new lymphadenopathy or easy bruising Neurological:Denies numbness, tingling or new weaknesses Behavioral/Psych: Mood is stable, no new changes  All other systems were reviewed with the patient and are negative.  PHYSICAL EXAMINATION: ECOG PERFORMANCE STATUS: 0 - Asymptomatic  Blood pressure 118/74, pulse 94, temperature 97.1 F (36.2 C), temperature source Oral, resp. rate 18, height 5\' 8"  (1.727 m), weight 128 lb 8 oz (58.287 kg).  GENERAL:alert, no distress and comfortable;thin elderly female with alopecia; Moderate kyphosis SKIN: skin color, texture, turgor are normal, no rashes or significant lesions EYES: normal, Conjunctiva are pink and non-injected, sclera clear OROPHARYNX:no exudate, no erythema and lips, buccal mucosa, and tongue normal  NECK: supple, thyroid normal size, non-tender, without nodularity LYMPH:  no palpable lymphadenopathy in the cervical, axillary or supraclavicular LUNGS: clear to auscultation and percussion with normal breathing effort HEART: regular rate & rhythm and no murmurs and no lower extremity edema ABDOMEN:abdomen soft, non-tender and normal bowel sounds Musculoskeletal:no cyanosis of digits and no clubbing  NEURO: alert & oriented x 3 with fluent speech, no focal motor/sensory deficits   LABORATORY DATA: Results for orders placed in visit on 05/04/13 (from the past 48 hour(s))  MAGNESIUM (CC13)     Status: None   Collection Time    05/04/13 10:10 AM      Result Value Range   Magnesium 2.3  1.5 - 2.5 mg/dl  COMPREHENSIVE METABOLIC PANEL (CC13)     Status: Abnormal   Collection Time    05/04/13 10:10 AM      Result Value Range   Sodium 139  136 - 145 mEq/L   Potassium 3.9  3.5 - 5.1 mEq/L   Chloride 105  98 - 109 mEq/L   CO2 25  22  - 29 mEq/L   Glucose 84  70 - 140 mg/dl   BUN 16.1  7.0 - 09.6 mg/dL   Creatinine 0.7  0.6 - 1.1 mg/dL   Total Bilirubin 0.45  0.20 - 1.20 mg/dL   Alkaline Phosphatase 81  40 - 150 U/L   AST 21  5 - 34 U/L   ALT 20  0 - 55 U/L   Total Protein 7.9  6.4 - 8.3 g/dL   Albumin 3.3 (*) 3.5 - 5.0 g/dL   Calcium 9.4  8.4 - 40.9 mg/dL   Anion Gap 9  3 - 11 mEq/L  CBC WITH DIFFERENTIAL     Status: Abnormal   Collection Time    05/04/13 10:11 AM      Result Value Range   WBC 5.0  3.9 - 10.3 10e3/uL   NEUT# 4.1  1.5 - 6.5 10e3/uL   HGB 10.4 (*) 11.6 - 15.9 g/dL   HCT 81.1 (*) 91.4 - 78.2 %   Platelets 206  145 - 400 10e3/uL   MCV 104.7 (*) 79.5 - 101.0 fL   MCH 35.2 (*) 25.1 - 34.0 pg   MCHC 33.7  31.5 - 36.0 g/dL   RBC 9.56 (*) 2.13 - 0.86 10e6/uL   RDW 21.5 (*) 11.2 - 14.5 %   lymph# 0.3 (*) 0.9 - 3.3 10e3/uL   MONO# 0.4  0.1 - 0.9 10e3/uL   Eosinophils Absolute 0.1  0.0 - 0.5 10e3/uL   Basophils Absolute 0.0  0.0 - 0.1  10e3/uL   NEUT% 83.3 (*) 38.4 - 76.8 %   LYMPH% 5.4 (*) 14.0 - 49.7 %   MONO% 8.6  0.0 - 14.0 %   EOS% 1.8  0.0 - 7.0 %   BASO% 0.9  0.0 - 2.0 %       Labs:  Lab Results  Component Value Date   WBC 5.0 05/04/2013   HGB 10.4* 05/04/2013   HCT 30.9* 05/04/2013   MCV 104.7* 05/04/2013   PLT 206 05/04/2013   NEUTROABS 4.1 05/04/2013      Chemistry      Component Value Date/Time   NA 139 05/04/2013 1010   NA 133* 05/08/2012 0922   K 3.9 05/04/2013 1010   K 4.2 05/08/2012 0922   CL 106 11/26/2012 0936   CL 100 05/08/2012 0922   CO2 25 05/04/2013 1010   CO2 27 05/08/2012 0922   BUN 15.1 05/04/2013 1010   BUN 20 05/08/2012 0922   CREATININE 0.7 05/04/2013 1010   CREATININE 0.67 07/13/2012 1310   CREATININE 0.88 05/08/2012 0922   CREATININE 0.55 04/01/2011 0250      Component Value Date/Time   CALCIUM 9.4 05/04/2013 1010   CALCIUM 9.4 05/08/2012 0922   ALKPHOS 81 05/04/2013 1010   ALKPHOS 77 05/08/2012 0922   AST 21 05/04/2013 1010   AST 21  05/08/2012 0922   ALT 20 05/04/2013 1010   ALT 15 05/08/2012 0922   BILITOT 0.71 05/04/2013 1010   BILITOT 0.6 05/08/2012 0922      Basic Metabolic Panel:  Recent Labs Lab 05/04/13 1010 05/04/13 1010  NA  --  139  K  --  3.9  CO2  --  25  GLUCOSE  --  84  BUN  --  15.1  CREATININE  --  0.7  CALCIUM  --  9.4  MG 2.3  --    GFR Estimated Creatinine Clearance: 60.2 ml/min (by C-G formula based on Cr of 0.7). Liver Function Tests:  Recent Labs Lab 05/04/13 1010  AST 21  ALT 20  ALKPHOS 81  BILITOT 0.71  PROT 7.9  ALBUMIN 3.3*   CBC:  Recent Labs Lab 05/04/13 1011  WBC 5.0  NEUTROABS 4.1  HGB 10.4*  HCT 30.9*  MCV 104.7*  PLT 206   Studies:  No results found.   RADIOGRAPHIC STUDIES: No results found.  ASSESSMENT: Shannon Rush 72 y.o. female with a history of Multiple myeloma - Plan: CBC with Differential, Comprehensive metabolic panel, Kappa/lambda light chains, SPEP & IFE with QIG  Anemia, macrocytic  KYPHOSIS  OSTEOPOROSIS  Unspecified vitamin D deficiency  PLAN:  1. IgG kappa Multiple myeloma s/p Auto SCT, intermediate risk. --Today's labs are reassuring.  - Acyclovir 400mg  PO daily to prevent risk of viral reactivation.  - Continue close observation s/p #1. Monitor M-protein and kappa/lambda ratios  --She is scheduled to receive her flu shot at the end of the month.  --We discussed maintenance therapy options with lenalodomide  10 mg once daily for 3 months, then increased to 15 mg daily if tolerated; continue until relapse (Attal, 2012; Melida Quitter, 2012) or 10 mg once daily for 21 days of a 28-day treatment cycle until relapse (Palumbo, 2010).  This is a catergory one recommendation based on The CALG 100104 trial demonstrating an improved median time to progression (46 months for lenalidomide group versus 27 months for placebo) and disease progression or death (37% versus 58% repectively).  However, second primary cancers occurred more  frequently with  the lenalidomide group (8% versus 3% in placebo) and increased with of severe neutropenia.   She will evaluate these risks and decide if she wants to pursue maintenance therapy with further discussion with her doctors at [facility].   2. Anemia, macrocytic. --Her hemoglobin is stable and she is asymptomatic.    3. Osteoporosis. -- Patient will have a DEXA scan performed at [facility].  She will continue calcium, vitamin D and weight bearing exercises.  4. Vitamin D defiency --She reports taking 50,000 units x one week, now tapering dose.    5. Follow-up. She will follow-up with [place] in 6 weeks with a repeat CBC, CMET, Magnesium.  F/u with [facility] (Dr. Carmell Austria on  05/19/2013)  All questions were answered. The patient knows to call the clinic with any problems, questions or concerns. We can certainly see the patient much sooner if necessary.  She was provided an after visit summary.   I spent 15 minutes counseling the patient face to face. The total time spent in the appointment was 25 minutes.    Chaden Doom, MD 05/05/2013 4:51 AM

## 2013-05-06 LAB — SPEP & IFE WITH QIG
Albumin ELP: 53.2 % — ABNORMAL LOW (ref 55.8–66.1)
Alpha-1-Globulin: 4.4 % (ref 2.9–4.9)
Alpha-2-Globulin: 9.1 % (ref 7.1–11.8)
Gamma Globulin: 24.7 % — ABNORMAL HIGH (ref 11.1–18.8)
IgM, Serum: 27 mg/dL — ABNORMAL LOW (ref 52–322)
Total Protein, Serum Electrophoresis: 7.4 g/dL (ref 6.0–8.3)

## 2013-05-06 LAB — KAPPA/LAMBDA LIGHT CHAINS
Kappa free light chain: 3.51 mg/dL — ABNORMAL HIGH (ref 0.33–1.94)
Kappa:Lambda Ratio: 1.47 (ref 0.26–1.65)

## 2013-05-12 ENCOUNTER — Ambulatory Visit (INDEPENDENT_AMBULATORY_CARE_PROVIDER_SITE_OTHER): Payer: Medicare Other | Admitting: *Deleted

## 2013-05-12 DIAGNOSIS — Z23 Encounter for immunization: Secondary | ICD-10-CM

## 2013-06-03 ENCOUNTER — Telehealth: Payer: Self-pay | Admitting: *Deleted

## 2013-06-03 NOTE — Telephone Encounter (Signed)
Pt called to move her appt from 06/16/13 to 06/14/13 w/ labs@ 3pm and ov @ 3:30pm...td

## 2013-06-14 ENCOUNTER — Ambulatory Visit (HOSPITAL_BASED_OUTPATIENT_CLINIC_OR_DEPARTMENT_OTHER): Payer: Medicare Other | Admitting: Internal Medicine

## 2013-06-14 ENCOUNTER — Encounter (INDEPENDENT_AMBULATORY_CARE_PROVIDER_SITE_OTHER): Payer: Self-pay

## 2013-06-14 ENCOUNTER — Other Ambulatory Visit (HOSPITAL_BASED_OUTPATIENT_CLINIC_OR_DEPARTMENT_OTHER): Payer: Medicare Other

## 2013-06-14 VITALS — BP 147/77 | HR 104 | Temp 98.0°F | Resp 20 | Ht 68.0 in | Wt 129.8 lb

## 2013-06-14 DIAGNOSIS — C9000 Multiple myeloma not having achieved remission: Secondary | ICD-10-CM

## 2013-06-14 DIAGNOSIS — Z9484 Stem cells transplant status: Secondary | ICD-10-CM

## 2013-06-14 DIAGNOSIS — D539 Nutritional anemia, unspecified: Secondary | ICD-10-CM

## 2013-06-14 DIAGNOSIS — M810 Age-related osteoporosis without current pathological fracture: Secondary | ICD-10-CM

## 2013-06-14 DIAGNOSIS — E559 Vitamin D deficiency, unspecified: Secondary | ICD-10-CM

## 2013-06-14 DIAGNOSIS — D649 Anemia, unspecified: Secondary | ICD-10-CM

## 2013-06-14 LAB — COMPREHENSIVE METABOLIC PANEL (CC13)
Albumin: 3.8 g/dL (ref 3.5–5.0)
Alkaline Phosphatase: 96 U/L (ref 40–150)
Anion Gap: 11 mEq/L (ref 3–11)
CO2: 24 mEq/L (ref 22–29)
Calcium: 9.6 mg/dL (ref 8.4–10.4)
Chloride: 107 mEq/L (ref 98–109)
Glucose: 125 mg/dl (ref 70–140)
Potassium: 3.6 mEq/L (ref 3.5–5.1)
Sodium: 142 mEq/L (ref 136–145)
Total Protein: 7.9 g/dL (ref 6.4–8.3)

## 2013-06-14 LAB — CBC WITH DIFFERENTIAL/PLATELET
Basophils Absolute: 0 10*3/uL (ref 0.0–0.1)
EOS%: 3 % (ref 0.0–7.0)
Eosinophils Absolute: 0.1 10*3/uL (ref 0.0–0.5)
HCT: 32.3 % — ABNORMAL LOW (ref 34.8–46.6)
HGB: 11 g/dL — ABNORMAL LOW (ref 11.6–15.9)
LYMPH%: 7.4 % — ABNORMAL LOW (ref 14.0–49.7)
MCH: 37.4 pg — ABNORMAL HIGH (ref 25.1–34.0)
MCV: 110.4 fL — ABNORMAL HIGH (ref 79.5–101.0)
MONO#: 0.4 10*3/uL (ref 0.1–0.9)
NEUT#: 2.7 10*3/uL (ref 1.5–6.5)
RBC: 2.93 10*6/uL — ABNORMAL LOW (ref 3.70–5.45)
RDW: 15.7 % — ABNORMAL HIGH (ref 11.2–14.5)
WBC: 3.5 10*3/uL — ABNORMAL LOW (ref 3.9–10.3)

## 2013-06-16 ENCOUNTER — Telehealth: Payer: Self-pay | Admitting: Internal Medicine

## 2013-06-16 ENCOUNTER — Ambulatory Visit: Payer: Medicare Other

## 2013-06-16 ENCOUNTER — Other Ambulatory Visit: Payer: Medicare Other | Admitting: Lab

## 2013-06-16 LAB — SPEP & IFE WITH QIG
Albumin ELP: 55.7 % — ABNORMAL LOW (ref 55.8–66.1)
Alpha-2-Globulin: 9.1 % (ref 7.1–11.8)
Beta Globulin: 5.3 % (ref 4.7–7.2)
IgA: 54 mg/dL — ABNORMAL LOW (ref 69–380)
IgM, Serum: 36 mg/dL — ABNORMAL LOW (ref 52–322)
Total Protein, Serum Electrophoresis: 7.3 g/dL (ref 6.0–8.3)

## 2013-06-16 LAB — KAPPA/LAMBDA LIGHT CHAINS: Kappa free light chain: 3.32 mg/dL — ABNORMAL HIGH (ref 0.33–1.94)

## 2013-06-16 NOTE — Progress Notes (Signed)
[facility] OFFICE PROGRESS NOTE  Sanjuana Letters, MD [address] [place] [address]  DIAGNOSIS: Anemia, macrocytic  OSTEOPOROSIS  Multiple myeloma - Plan: CBC with Differential in 1 month, Comprehensive metabolic panel  History of peripheral stem cell transplant - Plan: CBC with Differential in 1 month, Comprehensive metabolic panel  Chief Complaint  Patient presents with  . Multiple Myeloma    DIAGNOSIS: Multiple myeloma, presented with lytic bone lesions, anemia (Hgb 9.3). Presenting IgG was 4,040 mg/dL on 13/02/6577 (IgA 35; IgM 34); kappa free light chain 34.4 mg/dL, lambda 4.69, kappa:lambda ratio of 34.75; SPEP with M-spike of 2.60. Bone marrow biopsy on 07/03/2012 showed 49% plasma cell. Normal classical cytogenetics; however, myeloma FISH panel showed 13q- (intermediate risk)   PAST THERAPY: Palliative radiation 20 Gy over 10 fractions between 07/14/2012 and 07/27/2012 to thoracic spine cord compression and symptomatic left scapula lytic lesion.   CURRENT THERAPY: Started on 08/10/2012 SQ Velcade once weekly, 3 weeks on, 1 weeks off; daily Revlimid d1-21, 7 days off; and Dexamethasone 40mg  PO weekly. She received a auto bone marrow transplant (02/12/2013). Today, she is Post transplant day #124.   INTERIM HISTORY: Shannon Rush 72 y.o. female returns for regular follow up. She had a stem cell transplant at [facility] (Dr. Lyndel Safe). We last saw here on 05/04/2013. Her course was complicated by fever of undertermined source. Today she reports that her energy level is improving since transplant. She has return to bike riding. Her appetite has also improved and she reports her weight is near her baseline prior to transplant. She recently was seen by Dr. Donnie Coffin and Dr. Valarie Cones.  Dr. Valarie Cones suggested zometa based on her present osteopenia (Dexa repeated) and also she is being treated for vitamin d deficiency.    Patient denies visual changes,  confusion, drenching night sweats, palpable lymph node swelling, mucositis, odynophagia, dysphagia, nausea vomiting, jaundice, chest pain, palpitation, productive cough, gum bleeding, epistaxis, hematemesis, hemoptysis, abdominal pain, abdominal swelling, early satiety, melena, hematochezia, hematuria, skin rash, spontaneous bleeding, joint swelling, joint pain, heat or cold intolerance, bowel bladder incontinence, focal motor weakness, paresthesia.   MEDICAL HISTORY: Past Medical History  Diagnosis Date  . Multiple myeloma 07/01/2012  . Unspecified deficiency anemia   . OSTEOPOROSIS 06/11/2010    Multiple compression fractures; and spontaneous fracture of sternum Qualifier: Diagnosis of  By: Humberto Seals NP, Darl Pikes     . 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 Cord compression 07/13/12    MRI- diffuse myeloma involvement of T-L spine  . History of radiation therapy 07/13/12-07/27/12    spinal cord compression T3-T10,left scapula    INTERIM HISTORY: has Anemia, macrocytic; DJD, UNSPECIFIED; OSTEOPOROSIS; KYPHOSIS; LUMBOSACRAL STRAIN; Sciatica of left side; Abdominal pain, acute, right upper quadrant; Thoracic back pain; Multiple myeloma; Cord compression; Abnormal echocardiogram; Swelling, mass, or lump in chest; Abnormal electrocardiogram; History of peripheral stem cell transplant; and Unspecified vitamin D deficiency on her problem list.    ALLERGIES:  is allergic to zithromax and ciprofloxacin.  MEDICATIONS: has a current medication list which includes the following prescription(s): acyclovir, calcium carbonate, hydrocodone-acetaminophen, hydrocortisone cream, morphine, ondansetron, and vitamin d (ergocalciferol).  SURGICAL HISTORY:  Past Surgical History  Procedure Laterality Date  . Appendectomy    . Tubal ligation    . Cesarean section      x2   . Hip surgery  2009    left  . Elbow surgery    . Colonoscopy  2007  neg with Dr. Ewing Schlein    REVIEW OF SYSTEMS:   Constitutional:  Denies fevers, chills or abnormal weight loss Eyes: Denies blurriness of vision Ears, nose, mouth, throat, and face: Denies mucositis or sore throat Respiratory: Denies cough, dyspnea or wheezes Cardiovascular: Denies palpitation, chest discomfort or lower extremity swelling Gastrointestinal:  Denies nausea, heartburn or change in bowel habits Skin: Denies abnormal skin rashes Lymphatics: Denies new lymphadenopathy or easy bruising Neurological:Denies numbness, tingling or new weaknesses Behavioral/Psych: Mood is stable, no new changes  All other systems were reviewed with the patient and are negative.  PHYSICAL EXAMINATION: ECOG PERFORMANCE STATUS: 0 - Asymptomatic  Blood pressure 147/77, pulse 104, temperature 98 F (36.7 C), temperature source Oral, resp. rate 20, height 5\' 8"  (1.727 m), weight 129 lb 12.8 oz (58.877 kg).  GENERAL:alert, no distress and comfortable;thin elderly female with alopecia; Moderate kyphosis SKIN: skin color, texture, turgor are normal, no rashes or significant lesions EYES: normal, Conjunctiva are pink and non-injected, sclera clear OROPHARYNX:no exudate, no erythema and lips, buccal mucosa, and tongue normal  NECK: supple, thyroid normal size, non-tender, without nodularity LYMPH:  no palpable lymphadenopathy in the cervical, axillary or supraclavicular LUNGS: clear to auscultation and percussion with normal breathing effort HEART: regular rate & rhythm and no murmurs and no lower extremity edema ABDOMEN:abdomen soft, non-tender and normal bowel sounds Musculoskeletal:no cyanosis of digits and no clubbing  NEURO: alert & oriented x 3 with fluent speech, no focal motor/sensory deficits  LABORATORY DATA: Results for orders placed in visit on 06/14/13 (from the past 48 hour(s))  CBC WITH DIFFERENTIAL     Status: Abnormal   Collection Time    06/14/13  3:13 PM      Result Value Range   WBC 3.5 (*) 3.9 - 10.3 10e3/uL   NEUT# 2.7  1.5 - 6.5 10e3/uL   HGB  11.0 (*) 11.6 - 15.9 g/dL   HCT 16.1 (*) 09.6 - 04.5 %   Platelets 197  145 - 400 10e3/uL   MCV 110.4 (*) 79.5 - 101.0 fL   MCH 37.4 (*) 25.1 - 34.0 pg   MCHC 33.9  31.5 - 36.0 g/dL   RBC 4.09 (*) 8.11 - 9.14 10e6/uL   RDW 15.7 (*) 11.2 - 14.5 %   lymph# 0.3 (*) 0.9 - 3.3 10e3/uL   MONO# 0.4  0.1 - 0.9 10e3/uL   Eosinophils Absolute 0.1  0.0 - 0.5 10e3/uL   Basophils Absolute 0.0  0.0 - 0.1 10e3/uL   NEUT% 76.9 (*) 38.4 - 76.8 %   LYMPH% 7.4 (*) 14.0 - 49.7 %   MONO% 12.0  0.0 - 14.0 %   EOS% 3.0  0.0 - 7.0 %   BASO% 0.7  0.0 - 2.0 %  COMPREHENSIVE METABOLIC PANEL (CC13)     Status: None   Collection Time    06/14/13  3:13 PM      Result Value Range   Sodium 142  136 - 145 mEq/L   Potassium 3.6  3.5 - 5.1 mEq/L   Chloride 107  98 - 109 mEq/L   CO2 24  22 - 29 mEq/L   Glucose 125  70 - 140 mg/dl   BUN 78.2  7.0 - 95.6 mg/dL   Creatinine 0.8  0.6 - 1.1 mg/dL   Total Bilirubin 2.13  0.20 - 1.20 mg/dL   Alkaline Phosphatase 96  40 - 150 U/L   AST 30  5 - 34 U/L   ALT 25  0 -  55 U/L   Total Protein 7.9  6.4 - 8.3 g/dL   Albumin 3.8  3.5 - 5.0 g/dL   Calcium 9.6  8.4 - 96.0 mg/dL   Anion Gap 11  3 - 11 mEq/L  KAPPA/LAMBDA LIGHT CHAINS     Status: Abnormal (Preliminary result)   Collection Time    06/14/13  3:13 PM      Result Value Range   Kappa free light chain 3.32 (*) 0.33 - 1.94 mg/dL   Lambda Free Lght Chn 1.96  0.57 - 2.63 mg/dL   Kappa:Lambda Ratio 4.54 (*) 0.26 - 1.65    Labs:  Lab Results  Component Value Date   WBC 3.5* 06/14/2013   HGB 11.0* 06/14/2013   HCT 32.3* 06/14/2013   MCV 110.4* 06/14/2013   PLT 197 06/14/2013   NEUTROABS 2.7 06/14/2013      Chemistry      Component Value Date/Time   NA 142 06/14/2013 1513   NA 133* 05/08/2012 0922   K 3.6 06/14/2013 1513   K 4.2 05/08/2012 0922   CL 106 11/26/2012 0936   CL 100 05/08/2012 0922   CO2 24 06/14/2013 1513   CO2 27 05/08/2012 0922   BUN 18.3 06/14/2013 1513   BUN 20 05/08/2012 0922   CREATININE 0.8  06/14/2013 1513   CREATININE 0.67 07/13/2012 1310   CREATININE 0.88 05/08/2012 0922   CREATININE 0.55 04/01/2011 0250      Component Value Date/Time   CALCIUM 9.6 06/14/2013 1513   CALCIUM 9.4 05/08/2012 0922   ALKPHOS 96 06/14/2013 1513   ALKPHOS 77 05/08/2012 0922   AST 30 06/14/2013 1513   AST 21 05/08/2012 0922   ALT 25 06/14/2013 1513   ALT 15 05/08/2012 0922   BILITOT 0.52 06/14/2013 1513   BILITOT 0.6 05/08/2012 0922      Basic Metabolic Panel:  Recent Labs Lab 06/14/13 1513  NA 142  K 3.6  CO2 24  GLUCOSE 125  BUN 18.3  CREATININE 0.8  CALCIUM 9.6   GFR Estimated Creatinine Clearance: 60 ml/min (by C-G formula based on Cr of 0.8). Liver Function Tests:  Recent Labs Lab 06/14/13 1513  AST 30  ALT 25  ALKPHOS 96  BILITOT 0.52  PROT 7.9  ALBUMIN 3.8   CBC:  Recent Labs Lab 06/14/13 1513  WBC 3.5*  NEUTROABS 2.7  HGB 11.0*  HCT 32.3*  MCV 110.4*  PLT 197   Studies:  No results found.   RADIOGRAPHIC STUDIES: No results found.  ASSESSMENT: Shannon Rush 72 y.o. female with a history of Anemia, macrocytic  OSTEOPOROSIS  Multiple myeloma - Plan: CBC with Differential in 1 month, Comprehensive metabolic panel  History of peripheral stem cell transplant - Plan: CBC with Differential in 1 month, Comprehensive metabolic panel  PLAN:  1. IgG kappa Multiple myeloma s/p Auto SCT, intermediate risk. --Today's labs are reassuring.  - Acyclovir 400mg  PO daily to prevent risk of viral reactivation.  - Continue close observation s/p #1. Monitor M-protein and kappa/lambda ratios. Her kappa:Lambda ratio is 1.69.  We will await her M-ratio.    --She received her flu shot at the end of the month.  --We discussed maintenance therapy options with lenalodomide 10 mg once daily for 3 months, then increased to 15 mg daily if tolerated; continue until relapse (Attal, 2012; Melida Quitter, 2012) or 10 mg once daily for 21 days of a 28-day treatment cycle until relapse  (Palumbo, 2010).  This is a Dentist one recommendation  based on The CALG 100104 trial demonstrating an improved median time to progression (46 months for lenalidomide group versus 27 months for placebo) and disease progression or death (37% versus 58% repectively).  However, second primary cancers occurred more frequently with the lenalidomide group (8% versus 3% in placebo) and increased with of severe neutropenia.   She will evaluate these risks and decide if she wants to pursue maintenance therapy with further discussion with her doctors at [facility] in January. -- We will restart zometa 4 mg q 4 weeks. The risks and benefits of zometa were discussed.  Benefits include a significant risk reduction for vertebral, hip and non-vertebral fractures or skeletal related events. Potential risks of therapy were also conveyed , including but not limited to,  flu like reaction lasting 2-3 days, an approximately 1 in 1,000-10,000 risk for osteonecrosis of the jaw, nephrotoxicity, anemia, neutropenia, hypophosphatemia, hypocalcemia, hypomagnesemia.  She understood these risks and benefits and choose to proceed.  We will schedule her first infusion in 4 weeks.   2. Anemia, macrocytic. --Her hemoglobin is 11.0 and she is asymptomatic.    3. Osteoporosis. -- Patient had DEXA scan performed at [facility] consistent with improvement to osteopenia.  She will continue calcium, vitamin D and weight bearing exercises.  She will resume zometa as noted above.  4. Vitamin D defiency --She reports taking 50,000 units x one week, now tapering dose.    5. Follow-up. She will follow-up with [place] in 4 weeks with a repeat CBC, CMET, Magnesium.  F/u with [facility] (Dr. Carmell Austria on 07/2013).  Zometa next visit.   All questions were answered. The patient knows to call the clinic with any problems, questions or concerns. We can certainly see the patient much sooner if necessary.  She was provided an after visit summary.   I spent 15  minutes counseling the patient face to face. The total time spent in the appointment was 25 minutes.    Quentez Lober, MD 06/16/2013 6:38 AM

## 2013-06-16 NOTE — Telephone Encounter (Signed)
per 12/1 POF appts made Email to MW to add Tx Will call pt when Tx added shh

## 2013-06-17 ENCOUNTER — Telehealth: Payer: Self-pay | Admitting: Internal Medicine

## 2013-06-17 ENCOUNTER — Telehealth: Payer: Self-pay | Admitting: *Deleted

## 2013-06-17 NOTE — Telephone Encounter (Signed)
Per staff message and POF I have scheduled appts.  JMW  

## 2013-06-17 NOTE — Telephone Encounter (Signed)
called pt to advise of appts since MW added tx pt had questions so transferred pt to Nurse Kathy's extension shh

## 2013-06-24 ENCOUNTER — Telehealth: Payer: Self-pay | Admitting: Internal Medicine

## 2013-06-24 NOTE — Telephone Encounter (Signed)
returned pt call and r/s 12.12.14 time of appt....pt ok and aware

## 2013-06-25 ENCOUNTER — Ambulatory Visit (HOSPITAL_BASED_OUTPATIENT_CLINIC_OR_DEPARTMENT_OTHER): Payer: Medicare Other

## 2013-06-25 ENCOUNTER — Ambulatory Visit: Payer: Medicare Other

## 2013-06-25 ENCOUNTER — Other Ambulatory Visit: Payer: Self-pay | Admitting: Internal Medicine

## 2013-06-25 DIAGNOSIS — C9000 Multiple myeloma not having achieved remission: Secondary | ICD-10-CM

## 2013-06-25 MED ORDER — ZOLEDRONIC ACID 4 MG/5ML IV CONC
3.5000 mg | Freq: Once | INTRAVENOUS | Status: AC
  Administered 2013-06-25: 3.5 mg via INTRAVENOUS
  Filled 2013-06-25: qty 4.38

## 2013-06-25 MED ORDER — SODIUM CHLORIDE 0.9 % IV SOLN
Freq: Once | INTRAVENOUS | Status: AC
  Administered 2013-06-25: 13:00:00 via INTRAVENOUS

## 2013-06-25 NOTE — Patient Instructions (Signed)

## 2013-06-28 ENCOUNTER — Telehealth: Payer: Self-pay | Admitting: Internal Medicine

## 2013-06-28 NOTE — Telephone Encounter (Signed)
s.w. pt and advised that 1.2.14 appt was sched to early...advised pt on new d/t on 1.9 per 12.12.14 pof...pt ok and awre

## 2013-07-16 ENCOUNTER — Ambulatory Visit: Payer: Medicare Other

## 2013-07-16 ENCOUNTER — Other Ambulatory Visit: Payer: Medicare Other

## 2013-07-16 ENCOUNTER — Telehealth: Payer: Self-pay | Admitting: Medical Oncology

## 2013-07-16 NOTE — Telephone Encounter (Signed)
Pt called and left a message that she will be traveling to [place] in the future. She wanted to check with Dr. Juliann Mule to see if there are any immunizations she needs to get. She is post stem cell transplant Aug.01,2014. I spoke with Dr. Juliann Mule and she is currently up to date with immunizations that are required post transplant. She can check with her primary care MD and also [facility] to see if there is anything futher she needs. She voiced understanding.

## 2013-07-23 ENCOUNTER — Ambulatory Visit (HOSPITAL_BASED_OUTPATIENT_CLINIC_OR_DEPARTMENT_OTHER): Payer: Medicare Other | Admitting: Internal Medicine

## 2013-07-23 ENCOUNTER — Ambulatory Visit (HOSPITAL_BASED_OUTPATIENT_CLINIC_OR_DEPARTMENT_OTHER): Payer: Medicare Other

## 2013-07-23 ENCOUNTER — Ambulatory Visit (INDEPENDENT_AMBULATORY_CARE_PROVIDER_SITE_OTHER): Payer: Medicare Other | Admitting: Internal Medicine

## 2013-07-23 ENCOUNTER — Other Ambulatory Visit (HOSPITAL_BASED_OUTPATIENT_CLINIC_OR_DEPARTMENT_OTHER): Payer: Medicare Other

## 2013-07-23 VITALS — BP 133/72 | HR 84 | Temp 97.8°F | Resp 19 | Ht 68.0 in | Wt 131.0 lb

## 2013-07-23 DIAGNOSIS — C9000 Multiple myeloma not having achieved remission: Secondary | ICD-10-CM

## 2013-07-23 DIAGNOSIS — E559 Vitamin D deficiency, unspecified: Secondary | ICD-10-CM

## 2013-07-23 DIAGNOSIS — Z7184 Encounter for health counseling related to travel: Secondary | ICD-10-CM

## 2013-07-23 DIAGNOSIS — Z7189 Other specified counseling: Secondary | ICD-10-CM

## 2013-07-23 DIAGNOSIS — M899 Disorder of bone, unspecified: Secondary | ICD-10-CM

## 2013-07-23 DIAGNOSIS — M949 Disorder of cartilage, unspecified: Secondary | ICD-10-CM

## 2013-07-23 DIAGNOSIS — Z9484 Stem cells transplant status: Secondary | ICD-10-CM

## 2013-07-23 DIAGNOSIS — M810 Age-related osteoporosis without current pathological fracture: Secondary | ICD-10-CM

## 2013-07-23 LAB — CBC WITH DIFFERENTIAL/PLATELET
BASO%: 0.6 % (ref 0.0–2.0)
Basophils Absolute: 0 10*3/uL (ref 0.0–0.1)
EOS ABS: 0.1 10*3/uL (ref 0.0–0.5)
EOS%: 2.2 % (ref 0.0–7.0)
HCT: 35.7 % (ref 34.8–46.6)
HGB: 12.1 g/dL (ref 11.6–15.9)
LYMPH%: 8.9 % — AB (ref 14.0–49.7)
MCH: 35.9 pg — ABNORMAL HIGH (ref 25.1–34.0)
MCHC: 33.9 g/dL (ref 31.5–36.0)
MCV: 105.9 fL — ABNORMAL HIGH (ref 79.5–101.0)
MONO#: 0.9 10*3/uL (ref 0.1–0.9)
MONO%: 20.3 % — ABNORMAL HIGH (ref 0.0–14.0)
NEUT%: 68 % (ref 38.4–76.8)
NEUTROS ABS: 3.1 10*3/uL (ref 1.5–6.5)
PLATELETS: 178 10*3/uL (ref 145–400)
RBC: 3.37 10*6/uL — AB (ref 3.70–5.45)
RDW: 14.6 % — AB (ref 11.2–14.5)
WBC: 4.6 10*3/uL (ref 3.9–10.3)
lymph#: 0.4 10*3/uL — ABNORMAL LOW (ref 0.9–3.3)

## 2013-07-23 LAB — COMPREHENSIVE METABOLIC PANEL (CC13)
ALBUMIN: 3.9 g/dL (ref 3.5–5.0)
ALT: 23 U/L (ref 0–55)
AST: 25 U/L (ref 5–34)
Alkaline Phosphatase: 76 U/L (ref 40–150)
Anion Gap: 8 mEq/L (ref 3–11)
BILIRUBIN TOTAL: 0.61 mg/dL (ref 0.20–1.20)
BUN: 16.8 mg/dL (ref 7.0–26.0)
CO2: 25 meq/L (ref 22–29)
Calcium: 9.8 mg/dL (ref 8.4–10.4)
Chloride: 106 mEq/L (ref 98–109)
Creatinine: 0.8 mg/dL (ref 0.6–1.1)
GLUCOSE: 114 mg/dL (ref 70–140)
POTASSIUM: 3.8 meq/L (ref 3.5–5.1)
SODIUM: 139 meq/L (ref 136–145)
TOTAL PROTEIN: 7.4 g/dL (ref 6.4–8.3)

## 2013-07-23 MED ORDER — SODIUM CHLORIDE 0.9 % IV SOLN
Freq: Once | INTRAVENOUS | Status: AC
  Administered 2013-07-23: 13:00:00 via INTRAVENOUS

## 2013-07-23 MED ORDER — ZOLEDRONIC ACID 4 MG/5ML IV CONC
3.5000 mg | Freq: Once | INTRAVENOUS | Status: AC
  Administered 2013-07-23: 3.5 mg via INTRAVENOUS
  Filled 2013-07-23: qty 4.38

## 2013-07-23 MED ORDER — LENALIDOMIDE 5 MG PO CAPS: 5.0000 mg | ORAL_CAPSULE | Freq: Every day | ORAL | Status: DC

## 2013-07-23 NOTE — Patient Instructions (Signed)
Lenalidomide Oral Capsules What is this medicine? LENALIDOMIDE (len a LID oh mide) is used to treat certain types of cancer, including multiple myeloma and mantle cell lymphoma. It is also used to treat some myelodysplastic syndromes that cause severe anemia requiring blood transfusions. This medicine may be used for other purposes; ask your health care provider or pharmacist if you have questions. COMMON BRAND NAME(S): Revlimid What should I tell my health care provider before I take this medicine? They need to know if you have any of these conditions: -blood clots in the legs or the lungs -infection -irregular monthly periods or menstrual cycles -kidney disease -liver disease -an unusual or allergic reaction to lenalidomide, other medicines, foods, dyes, or preservatives -pregnant or trying to get pregnant -breast-feeding How should I use this medicine? Take this medicine by mouth with a glass of water. Follow the directions on the prescription label. Do not cut, crush, or chew this medicine. Take your medicine at regular intervals. Do not take it more often than directed. Do not stop taking except on your doctor's advice. A MedGuide will be given with each prescription and refill. Read this guide carefully each time. The MedGuide may change frequently. Talk to your pediatrician regarding the use of this medicine in children. Special care may be needed. Overdosage: If you think you have taken too much of this medicine contact a poison control center or emergency room at once. NOTE: This medicine is only for you. Do not share this medicine with others. What if I miss a dose? If you miss a dose, take it as soon as you can. If your next dose is to be taken in less than 12 hours, then do not take the missed dose. Take the next dose at your regular time. Do not take double or extra doses. What may interact with this medicine? -vaccines This list may not describe all possible interactions. Give  your health care provider a list of all the medicines, herbs, non-prescription drugs, or dietary supplements you use. Also tell them if you smoke, drink alcohol, or use illegal drugs. Some items may interact with your medicine. What should I watch for while using this medicine? Visit your doctor for regular check ups. Tell your doctor or healthcare professional if your symptoms do not start to get better or if they get worse. You will need to have important blood work done while you are taking this medicine. This medicine is available only through a special program. Doctors, pharmacies, and patients must meet all of the conditions of the program. Your health care provider will help you get signed up with the program if you need this medicine. Through the program you will only receive up to a 28 day supply of the medicine at one time. You will need a new prescription for each refill. This medicine can cause birth defects. Do not get pregnant while taking this drug. Females with child-bearing potential will need to have 2 negative pregnancy tests before starting this medicine. Pregnancy testing must be done every 2 to 4 weeks as directed while taking this medicine. Use 2 reliable forms of birth control together while you are taking this medicine and for 1 month after you stop taking this medicine. If you think that you might be pregnant talk to your doctor right away. Men must use a latex condom during sexual contact with a woman while taking this medicine and for 28 days after you stop taking this medicine. A latex condom is needed even  if you have had a vasectomy. Contact your doctor right away if your partner becomes pregnant. Do not donate sperm while taking this medicine and for 28 days after you stop taking this medicine. Do not give blood while taking the medicine and for 1 month after completion of treatment to avoid exposing pregnant women to the medicine through the donated blood. Talk to your doctor  about your risk of cancer. You may be more at risk for certain types of cancers if you take this medicine. You may need blood work done while you are taking this medicine. What side effects may I notice from receiving this medicine? Side effects that you should report to your doctor or health care professional as soon as possible: -allergic reactions like skin rash, itching or hives, swelling of the face, lips, or tongue -bloody or dark, tarry stools -breathing problems -chest pain -dark urine -fever, infection, runny nose, or sore throat -pain in the legs -right upper belly pain -swelling or your hands, ankles, or leg -tiredness -unusual bleeding or bruising -yellowing of the eyes or skin  Side effects that usually do not require medical attention (report to your doctor or health care professional if they continue or are bothersome): -diarrhea -dizziness -back pain This list may not describe all possible side effects. Call your doctor for medical advice about side effects. You may report side effects to FDA at 1-800-FDA-1088. Where should I keep my medicine? Keep out of the reach of children. Store at room temperature between 15 and 30 degrees C (59 and 86 degrees F). Throw away any unused medicine after the expiration date. NOTE: This sheet is a summary. It may not cover all possible information. If you have questions about this medicine, talk to your doctor, pharmacist, or health care provider.  2014, Elsevier/Gold Standard. (2011-12-24 18:01:39)

## 2013-07-23 NOTE — Progress Notes (Signed)
Spoke with Joya @ [facility] and was informed that pt is still on active file.  Informed Joya that a new script of  Revlimid 5 mg will be faxed to [facility] in early Feb. With the start date on 09/01/13 as per Dr. Juliann Mule.  Joya voiced understanding. [facility]  Phone    [phone].

## 2013-07-23 NOTE — Progress Notes (Signed)
  Subjective:    DESA RECH is a 73 y.o. female who presents to the Infectious Disease clinic for travel consultation. Planned departure date: January          Planned return date: 3 weeks Countries of travel: [place] Areas in country: travel   Accommodations: hotel Purpose of travel: vacation Prior travel out of [place]: yes Currently ill / Fever: no History of liver or kidney disease: no  Data Review:  reviewed medical problms, medications, has multiple myeloma currently getting treatment   Review of Systems A comprehensive review of systems was negative.    Objective:    n/a    Assessment:    No contraindications to travel. None, has had typhoid and yellow fever vaccine      Plan:    Issues discussed: future shots, malaria, MVA safety, rabies, safe food/water, traveler's diarrhea, website/handouts for more information, what to do if ill upon return, what to do if ill while there and Yellow Fever. Immunizations recommended: Td. Malaria prophylaxis: not indicated Traveler's diarrhea prophylaxis: has a supply.

## 2013-07-23 NOTE — Patient Instructions (Signed)

## 2013-07-25 ENCOUNTER — Encounter: Payer: Self-pay | Admitting: Internal Medicine

## 2013-07-25 NOTE — Progress Notes (Signed)
[place], MD [place] [address]  DIAGNOSIS: History of peripheral stem cell transplant - Plan: CBC with Differential, Comprehensive metabolic panel (Cmet) - [facility], Lactate dehydrogenase (LDH) - [facility], lenalidomide (REVLIMID) 5 MG capsule  Multiple myeloma - Plan: CBC with Differential, Comprehensive metabolic panel (Cmet) - [facility], Lactate dehydrogenase (LDH) - [facility], lenalidomide (REVLIMID) 5 MG capsule  Chief Complaint  Patient presents with  . Multiple Myeloma    DIAGNOSIS: Multiple myeloma, presented with lytic bone lesions, anemia (Hgb 9.3). Presenting IgG was 4,040 mg/dL on 06/05/2012 (IgA 35; IgM 34); kappa free light chain 34.4 mg/dL, lambda 0.00, kappa:lambda ratio of 34.75; SPEP with M-spike of 2.60. Bone marrow biopsy on 07/03/2012 showed 49% plasma cell. Normal classical cytogenetics; however, myeloma FISH panel showed 13q- (intermediate risk)   PAST THERAPY: Palliative radiation 20 Gy over 10 fractions between 07/14/2012 and 07/27/2012 to thoracic spine cord compression and symptomatic left scapula lytic lesion.   CURRENT THERAPY: Started on 08/10/2012 SQ Velcade once weekly, 3 weeks on, 1 weeks off; daily Revlimid d1-21, 7 days off; and Dexamethasone 19m PO weekly. She received a auto bone marrow transplant (02/12/2013).  Started zometa 3.5 mg monthly on 06/25/2013; Planning on maintenance therapy with revlimid starting on 09/01/2013.   INTERIM HISTORY: CKEYMORA GRILLOT73y.o. female returns for regular follow up. She had [facility](Dr. CAla Bent. We last saw here on 06/14/2013. Her course was complicated by fever of undertermined source. Today she reports that her energy level is improving since transplant. She has return to bike riding. Her appetite has also improved and she reports her weight is near her baseline prior to transplant. She recently was seen by Dr. GSamule Ohm and Dr. WGale Journey  Dr. WGale Journeysuggested zometa based on her present osteopenia (Dexa repeated) and also she is being treated for vitamin d deficiency.  Dr. GSamule Ohmsuggested she start revlimid 5 mg daily for maintenance.  Patient [place]   Patient denies visual changes, confusion, drenching night sweats, palpable lymph node swelling, mucositis, odynophagia, dysphagia, nausea vomiting, jaundice, chest pain, palpitation, productive cough, gum bleeding, epistaxis, hematemesis, hemoptysis, abdominal pain, abdominal swelling, early satiety, melena, hematochezia, hematuria, skin rash, spontaneous bleeding, joint swelling, joint pain, heat or cold intolerance, bowel bladder incontinence, focal motor weakness, paresthesia.   MEDICAL HISTORY: Past Medical History  Diagnosis Date  . Multiple myeloma 07/01/2012  . Unspecified deficiency anemia   . OSTEOPOROSIS 06/11/2010    Multiple compression fractures; and spontaneous fracture of sternum Qualifier: Diagnosis of  By: SZebedee IbaNP, SManuela Schwartz    . 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 Cord compression 07/13/12    MRI- diffuse myeloma involvement of T-L spine  . History of radiation therapy 07/13/12-07/27/12    spinal cord compression T3-T10,left scapula    INTERIM HISTORY: has Anemia, macrocytic; DJD, UNSPECIFIED; OSTEOPOROSIS; KYPHOSIS; LUMBOSACRAL STRAIN; Sciatica of left side; Abdominal pain, acute, right upper quadrant; Thoracic back pain; Multiple myeloma; Cord compression; Abnormal echocardiogram; Swelling, mass, or lump in chest; Abnormal electrocardiogram; History of peripheral stem cell transplant; and Unspecified vitamin D deficiency on her problem list.    ALLERGIES:  is allergic to zithromax and ciprofloxacin.  MEDICATIONS: has a current medication list which includes the following prescription(s): acyclovir, calcium carbonate, vitamin d (ergocalciferol), hydrocodone-acetaminophen,  hydrocortisone cream, lenalidomide, morphine, and ondansetron.  SURGICAL HISTORY:  Past Surgical History  Procedure Laterality Date  . Appendectomy    . Tubal ligation    . Cesarean section      x2   . Hip surgery  2009    left  . Elbow surgery    . Colonoscopy  2007    neg with Dr. [name]    REVIEW OF SYSTEMS:   Constitutional: Denies fevers, chills or abnormal weight loss Eyes: Denies blurriness of vision Ears, nose, mouth, throat, and face: Denies mucositis or sore throat Respiratory: Denies cough, dyspnea or wheezes Cardiovascular: Denies palpitation, chest discomfort or lower extremity swelling Gastrointestinal:  Denies nausea, heartburn or change in bowel habits Skin: Denies abnormal skin rashes Lymphatics: Denies new lymphadenopathy or easy bruising Neurological:Denies numbness, tingling or new weaknesses Behavioral/Psych: Mood is stable, no new changes  All other systems were reviewed with the patient and are negative.  PHYSICAL EXAMINATION: ECOG PERFORMANCE STATUS: 0 - Asymptomatic  Blood pressure 133/72, pulse 84, temperature 97.8 F (36.6 C), temperature source Oral, resp. rate 19, height _0  (1.727 m), weight 131 lb (59.421 kg), SpO2 100.00%.  GENERAL:alert, no distress and comfortable;thin elderly female with alopecia; Moderate kyphosis SKIN: skin color, texture, turgor are normal, no rashes or significant lesions EYES: normal, Conjunctiva are pink and non-injected, sclera clear OROPHARYNX:no exudate, no erythema and lips, buccal mucosa, and tongue normal  NECK: supple, thyroid normal size, non-tender, without nodularity LYMPH:  no palpable lymphadenopathy in the cervical, axillary or supraclavicular LUNGS: clear to auscultation and percussion with normal breathing effort HEART: regular rate & rhythm and no murmurs and no lower extremity edema ABDOMEN:abdomen soft, non-tender and normal bowel sounds Musculoskeletal:no cyanosis of digits and no clubbing   NEURO: alert & oriented x 3 with fluent speech, no focal motor/sensory deficits  LABORATORY DATA: Results for orders placed in visit on 07/23/13 (from the past 48 hour(s))  CBC WITH DIFFERENTIAL     Status: Abnormal   Collection Time    07/23/13 11:42 AM      Result Value Range   WBC 4.6  3.9 - 10.3 10e3/uL   NEUT# 3.1  1.5 - 6.5 10e3/uL   HGB 12.1  11.6 - 15.9 g/dL   HCT 35.7  34.8 - 46.6 %   Platelets 178  145 - 400 10e3/uL   MCV 105.9 (*) 79.5 - 101.0 fL   MCH 35.9 (*) 25.1 - 34.0 pg   MCHC 33.9  31.5 - 36.0 g/dL   RBC 3.37 (*) 3.70 - 5.45 10e6/uL   RDW 14.6 (*) 11.2 - 14.5 %   lymph# 0.4 (*) 0.9 - 3.3 10e3/uL   MONO# 0.9  0.1 - 0.9 10e3/uL   Eosinophils Absolute 0.1  0.0 - 0.5 10e3/uL   Basophils Absolute 0.0  0.0 - 0.1 10e3/uL   NEUT% 68.0  38.4 - 76.8 %   LYMPH% 8.9 (*) 14.0 - 49.7 %   MONO% 20.3 (*) 0.0 - 14.0 %   EOS% 2.2  0.0 - 7.0 %   BASO% 0.6  0.0 - 2.0 %  COMPREHENSIVE METABOLIC PANEL (ZL93)     Status: None   Collection Time    07/23/13 11:42 AM      Result Value Range   Sodium 139  136 - 145 mEq/L   Potassium 3.8  3.5 - 5.1 mEq/L   Chloride 106  98 - 109 mEq/L   CO2 25  22 - 29 mEq/L   Glucose 114  70 - 140 mg/dl   BUN 16.8  7.0 - 26.0 mg/dL   Creatinine 0.8  0.6 - 1.1 mg/dL   Total Bilirubin 0.61  0.20 - 1.20 mg/dL   Alkaline Phosphatase 76  40 - 150 U/L   AST 25  5 - 34 U/L   ALT 23  0 - 55 U/L   Total Protein 7.4  6.4 - 8.3 g/dL   Albumin 3.9  3.5 - 5.0 g/dL   Calcium 9.8  8.4 - 10.4 mg/dL   Anion Gap 8  3 - 11 mEq/L    Labs:  Lab Results  Component Value Date   WBC 4.6 07/23/2013   HGB 12.1 07/23/2013   HCT 35.7 07/23/2013   MCV 105.9* 07/23/2013   PLT 178 07/23/2013   NEUTROABS 3.1 07/23/2013      Chemistry      Component Value Date/Time   NA 139 07/23/2013 1142   NA 133* 05/08/2012 0922   K 3.8 07/23/2013 1142   K 4.2 05/08/2012 0922   CL 106 11/26/2012 0936   CL 100 05/08/2012 0922   CO2 25 07/23/2013 1142   CO2 27 05/08/2012 0922   BUN 16.8  07/23/2013 1142   BUN 20 05/08/2012 0922   CREATININE 0.8 07/23/2013 1142   CREATININE 0.67 07/13/2012 1310   CREATININE 0.88 05/08/2012 0922   CREATININE 0.55 04/01/2011 0250      Component Value Date/Time   CALCIUM 9.8 07/23/2013 1142   CALCIUM 9.4 05/08/2012 0922   ALKPHOS 76 07/23/2013 1142   ALKPHOS 77 05/08/2012 0922   AST 25 07/23/2013 1142   AST 21 05/08/2012 0922   ALT 23 07/23/2013 1142   ALT 15 05/08/2012 0922   BILITOT 0.61 07/23/2013 1142   BILITOT 0.6 05/08/2012 0922      Basic Metabolic Panel:  Recent Labs Lab 07/23/13 1142  NA 139  K 3.8  CO2 25  GLUCOSE 114  BUN 16.8  CREATININE 0.8  CALCIUM 9.8   GFR Estimated Creatinine Clearance: 60.5 ml/min (by C-G formula based on Cr of 0.8). Liver Function Tests:  Recent Labs Lab 07/23/13 1142  AST 25  ALT 23  ALKPHOS 76  BILITOT 0.61  PROT 7.4  ALBUMIN 3.9   CBC:  Recent Labs Lab 07/23/13 1142  WBC 4.6  NEUTROABS 3.1  HGB 12.1  HCT 35.7  MCV 105.9*  PLT 178   Studies:  No results found.   RADIOGRAPHIC STUDIES: No results found.  ASSESSMENT: Shannon Rush 73 y.o. female with a history of History of peripheral stem cell transplant - Plan: CBC with Differential, Comprehensive metabolic panel (Cmet) - [facility], Lactate dehydrogenase (LDH) - [facility], lenalidomide (REVLIMID) 5 MG capsule  Multiple myeloma - Plan: CBC with Differential, Comprehensive metabolic panel (Cmet) - [facility], Lactate dehydrogenase (LDH) - [facility], lenalidomide (REVLIMID) 5 MG capsule  PLAN:  1. IgG kappa Multiple myeloma s/p Auto SCT, intermediate risk. --Today's labs are reassuring.  - Acyclovir 473m PO daily to prevent risk of viral reactivation for one year post transplant.  - Continue close observation s/p #1. Monitor M-protein and kappa/lambda ratios. Her kappa:Lambda ratio is 1.69.  We will await her M-ratio.    --She received her flu shot at the end of the month.  --We discussed maintenance therapy options with lenalodomide  556mto 10  mg once daily for 3 months, then increased to 15 mg daily if tolerated; continue until relapse (Attal, 2012; McAnabel Bene2012) or 10 mg once daily for 21 days of a 28-day treatment cycle until relapse (Palumbo,  2010).  This is a catergory one recommendation based on The CALG 100104 trial demonstrating an improved median time to progression (46 months for lenalidomide group versus 27 months for placebo) and disease progression or death (37% versus 58% repectively).  However, second primary cancers occurred more frequently with the lenalidomide group (8% versus 3% in placebo) and increased with of severe neutropenia.   She evaluated these risks and decided and agreed to proceed with lenaladomide  maintenance therapy after further discussion with her doctors at [facility] in January.  We will plan on starting 5 mg daily upon her return from [place] on 09/01/2013.  She will start low dose ASA with revlimid.   -- We  restarted zometa 3.5 mg q 4 weeks on 06/25/2013. The risks and benefits of zometa were discussed.  Benefits include a significant risk reduction for vertebral, hip and non-vertebral fractures or skeletal related events. Potential risks of therapy were also conveyed , including but not limited to,  flu like reaction lasting 2-3 days, an approximately 1 in 1,000-10,000 risk for osteonecrosis of the jaw, nephrotoxicity, anemia, neutropenia, hypophosphatemia, hypocalcemia, hypomagnesemia.  She understood these risks and benefits and choose to proceed.  We will schedule her  infusion on 09/03/2013.   2. Anemia, macrocytic (resolved). --Her hemoglobin is 12.1and she is asymptomatic.    3. Osteoporosis. -- Patient had DEXA scan performed at [facility] consistent with improvement to osteopenia.  She will continue calcium, vitamin D and weight bearing exercises.  She will continue zometa as noted above.  4. Vitamin D defiency --She reports taking 50,000 units x one week, now tapering dose.    5. Follow-up. She will  follow-up with [place] in 4 weeks with a repeat CBC, CMET, Magnesium.  F/u with [facility].  Zometa next visit.   All questions were answered. The patient knows to call the clinic with any problems, questions or concerns. We can certainly see the patient much sooner if necessary.  She was provided an after visit summary.   I spent 15 minutes counseling the patient face to face. The total time spent in the appointment was 25 minutes.    Ryzen Deady, MD 07/25/2013 11:36 AM

## 2013-07-26 ENCOUNTER — Telehealth: Payer: Self-pay | Admitting: Internal Medicine

## 2013-07-26 NOTE — Telephone Encounter (Signed)
lvm for pt regarding to 2.2.15 appt....mailed pt appt sched, avs and letter

## 2013-07-29 ENCOUNTER — Telehealth: Payer: Self-pay

## 2013-07-29 ENCOUNTER — Encounter: Payer: Self-pay | Admitting: Family Medicine

## 2013-07-29 NOTE — Progress Notes (Signed)
   Subjective:    Patient ID: Shannon Rush, female    DOB: [birth year], 73 y.o.   MRN: [id number]  HPI Had colonoscopy via [facility] 07/21/13.  Has history of colon polyps No polyps this time    Review of Systems     Objective:   Physical Exam        Assessment & Plan:

## 2013-07-29 NOTE — Telephone Encounter (Signed)
Pt lvm that her MS IR was 15mg . She states she does not use it very often. I could not figure out how to update medication list from a phone call encounter so note made in "reason for call".

## 2013-08-02 ENCOUNTER — Other Ambulatory Visit: Payer: Self-pay | Admitting: Medical Oncology

## 2013-08-02 DIAGNOSIS — Z9484 Stem cells transplant status: Secondary | ICD-10-CM

## 2013-08-02 DIAGNOSIS — C9000 Multiple myeloma not having achieved remission: Secondary | ICD-10-CM

## 2013-08-02 MED ORDER — LENALIDOMIDE 5 MG PO CAPS: 5.0000 mg | ORAL_CAPSULE | Freq: Every day | ORAL | Status: DC

## 2013-08-03 ENCOUNTER — Other Ambulatory Visit: Payer: Self-pay | Admitting: Internal Medicine

## 2013-08-03 ENCOUNTER — Telehealth: Payer: Self-pay | Admitting: *Deleted

## 2013-08-03 NOTE — Telephone Encounter (Signed)
WOULD LIKE TO SPEAK TO DR.CHISM'S NURSE CONCERNING PT.'S NEW START ON REVLIMID. THIS NOTE TO DR.CHISM'S NURSE, ROBIN BASS,RN.

## 2013-08-04 ENCOUNTER — Telehealth: Payer: Self-pay | Admitting: Medical Oncology

## 2013-08-04 NOTE — Telephone Encounter (Signed)
Fax from [facility] regarding approval of Revlimid through 08/01/2014 received and given to Shannon Rush in managed care.

## 2013-09-02 ENCOUNTER — Telehealth: Payer: Self-pay | Admitting: Medical Oncology

## 2013-09-02 NOTE — Telephone Encounter (Signed)
Fax received from [facility] confirming delivery of Revlimid to patient on 09/02/2013.

## 2013-09-03 ENCOUNTER — Other Ambulatory Visit (HOSPITAL_BASED_OUTPATIENT_CLINIC_OR_DEPARTMENT_OTHER): Payer: Medicare Other

## 2013-09-03 ENCOUNTER — Encounter (INDEPENDENT_AMBULATORY_CARE_PROVIDER_SITE_OTHER): Payer: Self-pay

## 2013-09-03 ENCOUNTER — Ambulatory Visit (HOSPITAL_BASED_OUTPATIENT_CLINIC_OR_DEPARTMENT_OTHER): Payer: Medicare Other | Admitting: Internal Medicine

## 2013-09-03 ENCOUNTER — Ambulatory Visit (HOSPITAL_BASED_OUTPATIENT_CLINIC_OR_DEPARTMENT_OTHER): Payer: Medicare Other

## 2013-09-03 ENCOUNTER — Other Ambulatory Visit: Payer: Self-pay | Admitting: Internal Medicine

## 2013-09-03 VITALS — BP 120/77 | HR 79 | Temp 97.1°F | Resp 18 | Ht 68.0 in | Wt 128.9 lb

## 2013-09-03 DIAGNOSIS — C9000 Multiple myeloma not having achieved remission: Secondary | ICD-10-CM

## 2013-09-03 DIAGNOSIS — M810 Age-related osteoporosis without current pathological fracture: Secondary | ICD-10-CM

## 2013-09-03 DIAGNOSIS — Z9484 Stem cells transplant status: Secondary | ICD-10-CM

## 2013-09-03 DIAGNOSIS — Z23 Encounter for immunization: Secondary | ICD-10-CM

## 2013-09-03 LAB — COMPREHENSIVE METABOLIC PANEL (CC13)
ALK PHOS: 55 U/L (ref 40–150)
ALT: 15 U/L (ref 0–55)
AST: 22 U/L (ref 5–34)
Albumin: 3.9 g/dL (ref 3.5–5.0)
Anion Gap: 8 mEq/L (ref 3–11)
BUN: 17.7 mg/dL (ref 7.0–26.0)
CALCIUM: 9.5 mg/dL (ref 8.4–10.4)
CHLORIDE: 106 meq/L (ref 98–109)
CO2: 27 mEq/L (ref 22–29)
Creatinine: 0.8 mg/dL (ref 0.6–1.1)
Glucose: 78 mg/dl (ref 70–140)
POTASSIUM: 4 meq/L (ref 3.5–5.1)
Sodium: 141 mEq/L (ref 136–145)
Total Bilirubin: 0.59 mg/dL (ref 0.20–1.20)
Total Protein: 6.9 g/dL (ref 6.4–8.3)

## 2013-09-03 LAB — CBC WITH DIFFERENTIAL/PLATELET
BASO%: 0.7 % (ref 0.0–2.0)
BASOS ABS: 0 10*3/uL (ref 0.0–0.1)
EOS%: 1.5 % (ref 0.0–7.0)
Eosinophils Absolute: 0.1 10*3/uL (ref 0.0–0.5)
HCT: 37 % (ref 34.8–46.6)
HEMOGLOBIN: 12.6 g/dL (ref 11.6–15.9)
LYMPH%: 11.4 % — AB (ref 14.0–49.7)
MCH: 35.8 pg — AB (ref 25.1–34.0)
MCHC: 34.1 g/dL (ref 31.5–36.0)
MCV: 105.1 fL — ABNORMAL HIGH (ref 79.5–101.0)
MONO#: 0.4 10*3/uL (ref 0.1–0.9)
MONO%: 10.6 % (ref 0.0–14.0)
NEUT#: 3.1 10*3/uL (ref 1.5–6.5)
NEUT%: 75.8 % (ref 38.4–76.8)
PLATELETS: 168 10*3/uL (ref 145–400)
RBC: 3.52 10*6/uL — ABNORMAL LOW (ref 3.70–5.45)
RDW: 14.3 % (ref 11.2–14.5)
WBC: 4 10*3/uL (ref 3.9–10.3)
lymph#: 0.5 10*3/uL — ABNORMAL LOW (ref 0.9–3.3)
nRBC: 0 % (ref 0–0)

## 2013-09-03 LAB — LACTATE DEHYDROGENASE (CC13): LDH: 217 U/L (ref 125–245)

## 2013-09-03 MED ORDER — SODIUM CHLORIDE 0.9 % IV SOLN
Freq: Once | INTRAVENOUS | Status: AC
  Administered 2013-09-03: 12:00:00 via INTRAVENOUS

## 2013-09-03 MED ORDER — SODIUM CHLORIDE 0.9 % IV SOLN
3.5000 mg | Freq: Once | INTRAVENOUS | Status: AC
  Administered 2013-09-03: 3.5 mg via INTRAVENOUS
  Filled 2013-09-03: qty 4.38

## 2013-09-03 NOTE — Progress Notes (Signed)
[facility], MD [place] [address]  DIAGNOSIS: History of peripheral stem cell transplant  Multiple myeloma - Plan: CBC with Differential, Comprehensive metabolic panel (Cmet) - [facility], Protein / creatinine ratio, urine, Immunofixation interpretive, urine, SPEP & IFE with QIG, DG Bone Survey Met  Chief Complaint  Patient presents with  . Multiple Myeloma    CURRENT TREATMENT:  Zometa 3.5 mg monthly;  Revlimide 5 mg daily for maintenance for at least 2 years started on 09/02/2013.     Multiple myeloma   05/26/2012 Initial Diagnosis Presenting IgG was 4,040 mg/dL on 06/05/2012 (IgA 35; IgM 34); kappa free light chain 34.4 mg/dL, lambda 0.00, kappa:lambda ratio of 34.75; SPEP with M-spike of 2.60.   06/30/2012 Imaging Numerous lytic lesions throughout the calvarium.  Lytic lesion within the left lateral scapula.  Findings most compatible with metastases or myeloma. Multiple mid and lower thoracic compression fractures.  Slight compression through the endplates at L3.   79/89/2119 Bone Marrow Biopsy Bone marrow biopsy showed 49% plasma cell. Normal classical cytogenetics; however, myeloma FISH panel showed 13q- (intermediate risk)       07/14/2012 - 07/27/2012 Radiation Therapy Palliative radiation 20 Gy over 10 fractions between to thoracic spine cord compression and symptomatic left scapula lytic lesion.   08/10/2012 -  Chemotherapy Started SQ Velcade once weekly, 3 weeks on, 1 weeks off; daily Revlimid d1-21, 7 days off; and Dexamethasone 34m PO weekly.    02/12/2013 Bone Marrow Transplant [facility]    06/14/2013 Tumor Marker IgG  1830,  Kappa:lamba ratio, 1.69 (baseline was 34.75 or   M-spike 0.28 (baseline of 2.6 or  89.3% of baseline).     06/25/2013 -  Chemotherapy Started zometa 3.5 mg monthly    09/01/2013 -  Chemotherapy Maintenance therapy with revlimid 514mdaily.    INTERVAL  HISTORY: Shannon FISCHBACH89.o. female with a past medical history of MM as detailed above, s/p SCT is here for follow-up.  She reports returning from her trip to [place] 08/31/2013.  She reports having a wonderful time without any hospitalizations or emergency room visits.  She also reports being seen by Dr. GaSamule Ohmith recommendations to start revlidmid maintenance which she started on yesterday.  She denies any fevers or chills.    MEDICAL HISTORY: Past Medical History  Diagnosis Date  . Multiple myeloma 07/01/2012  . Unspecified deficiency anemia   . OSTEOPOROSIS 06/11/2010    Multiple compression fractures; and spontaneous fracture of sternum Qualifier: Diagnosis of  By: Shannon Rush, Shannon Rush   . 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 Cord compression 07/13/12    MRI- diffuse myeloma involvement of T-L spine  . History of radiation therapy 07/13/12-07/27/12    spinal cord compression T3-T10,left scapula    INTERIM HISTORY: has Anemia, macrocytic; DJD, UNSPECIFIED; OSTEOPOROSIS; KYPHOSIS; LUMBOSACRAL STRAIN; Sciatica of left side; Abdominal pain, acute, right upper quadrant; Thoracic back pain; Multiple myeloma; Cord compression; Abnormal echocardiogram; Swelling, mass, or lump in chest; Abnormal electrocardiogram; History of peripheral stem cell transplant; and Unspecified vitamin D deficiency on her problem list.    ALLERGIES:  is allergic to zithromax and ciprofloxacin.  MEDICATIONS: has a current medication list which includes the following prescription(s): acyclovir, calcium carbonate, hydrocodone-acetaminophen, hydrocortisone cream, lenalidomide, morphine, morphine, ondansetron, and vitamin d (ergocalciferol).  SURGICAL HISTORY:  Past Surgical History  Procedure Laterality Date  . Appendectomy    .  Tubal ligation    . Cesarean section      x2   . Hip surgery  2009    left  . Elbow surgery    . Colonoscopy  2007    neg with Dr. [name]    REVIEW OF SYSTEMS:    Constitutional: Denies abnormal weight loss Eyes: Denies blurriness of vision Ears, nose, mouth, throat, and face: Denies mucositis or sore throat Respiratory: Denies cough, dyspnea or wheezes Cardiovascular: Denies palpitation, chest discomfort or lower extremity swelling Gastrointestinal:  Denies nausea, heartburn or change in bowel habits Skin: Denies abnormal skin rashes Lymphatics: Denies new lymphadenopathy or easy bruising Neurological:Denies numbness, tingling or new weaknesses Behavioral/Psych: Mood is stable, no new changes  All other systems were reviewed with the patient and are negative.  PHYSICAL EXAMINATION: ECOG PERFORMANCE STATUS: 0 - Asymptomatic  Blood pressure 120/77, pulse 79, temperature 97.1 F (36.2 C), temperature source Oral, resp. rate 18, height _0  (1.727 m), weight 128 lb 14.4 oz (58.469 kg), SpO2 98.00%.  GENERAL:alert, no distress and comfortable; easily mobile to exam table; kyphosis, moderate.  SKIN: skin color, texture, turgor are normal, no rashes or significant lesions EYES: normal, Conjunctiva are pink and non-injected, sclera clear OROPHARYNX:no exudate, no erythema and lips, buccal mucosa, and tongue normal  NECK: supple, thyroid normal size, non-tender, without nodularity LYMPH:  no palpable lymphadenopathy in the cervical, axillary or supraclavicular LUNGS: clear to auscultation with normal breathing effort, no wheezes or rhonchi HEART: regular rate & rhythm and no murmurs and no lower extremity edema ABDOMEN:abdomen soft, non-tender and normal bowel sounds Musculoskeletal:no cyanosis of digits and no clubbing  NEURO: alert & oriented x 3 with fluent speech, no focal motor/sensory deficits  Labs:  Lab Results  Component Value Date   WBC 4.0 09/03/2013   HGB 12.6 09/03/2013   HCT 37.0 09/03/2013   MCV 105.1* 09/03/2013   PLT 168 09/03/2013   NEUTROABS 3.1 09/03/2013      Chemistry      Component Value Date/Time   NA 141 09/03/2013 1026    NA 133* 05/08/2012 0922   K 4.0 09/03/2013 1026   K 4.2 05/08/2012 0922   CL 106 11/26/2012 0936   CL 100 05/08/2012 0922   CO2 27 09/03/2013 1026   CO2 27 05/08/2012 0922   BUN 17.7 09/03/2013 1026   BUN 20 05/08/2012 0922   CREATININE 0.8 09/03/2013 1026   CREATININE 0.67 07/13/2012 1310   CREATININE 0.88 05/08/2012 0922   CREATININE 0.55 04/01/2011 0250      Component Value Date/Time   CALCIUM 9.5 09/03/2013 1026   CALCIUM 9.4 05/08/2012 0922   ALKPHOS 55 09/03/2013 1026   ALKPHOS 77 05/08/2012 0922   AST 22 09/03/2013 1026   AST 21 05/08/2012 0922   ALT 15 09/03/2013 1026   ALT 15 05/08/2012 0922   BILITOT 0.59 09/03/2013 1026   BILITOT 0.6 05/08/2012 0922     CBC:  Recent Labs Lab 09/03/13 1026  WBC 4.0  NEUTROABS 3.1  HGB 12.6  HCT 37.0  MCV 105.1*  PLT 168   Studies:  No results found.   RADIOGRAPHIC STUDIES: No results found.  ASSESSMENT: Shannon Rush 73 y.o. female with a history of History of peripheral stem cell transplant  Multiple myeloma - Plan: CBC with Differential, Comprehensive metabolic panel (Cmet) - [facility], Protein / creatinine ratio, urine, Immunofixation interpretive, urine, SPEP & IFE with QIG, DG Bone Survey Met   PLAN:  1. IgG kappa Multiple  myeloma s/p Auto SCT, intermediate risk.  --Today's labs are reassuring and demonstrate count recovery.  - Acyclovir 464m PO daily to prevent risk of viral reactivation for one year post transplant.  -- She started revlimid 5 mg daily for maintenance on 09/02/2013.  -- We restarted zometa 3.5 mg q 4 weeks on 06/25/2013. She will receive an infusion today.  --Now 6 months from SCT, will order prevnar today.  --We will obtain a baseline skeletal survey and it's been over one year and a half since the last one.   2. Osteoporosis.  -- [facility] with improvement to osteopenia. She will continue calcium, vitamin D and weight bearing exercises. She will continue  zometa as noted above.    3. Follow-up.  She will follow-up with [facility] 4 weeks with a repeat CBC, CMET, Magnesium.  Zometa next visit.    All questions were answered. The patient knows to call the clinic with any problems, questions or concerns. We can certainly see the patient much sooner if necessary.  I spent 15 minutes counseling the patient face to face. The total time spent in the appointment was 25 minutes.    Thurma Priego, MD 09/04/2013 3:09 PM

## 2013-09-03 NOTE — Patient Instructions (Signed)

## 2013-09-09 ENCOUNTER — Other Ambulatory Visit: Payer: Self-pay | Admitting: *Deleted

## 2013-09-09 DIAGNOSIS — C9000 Multiple myeloma not having achieved remission: Secondary | ICD-10-CM

## 2013-09-09 MED ORDER — ACYCLOVIR 400 MG PO TABS: 400.0000 mg | ORAL_TABLET | Freq: Every day | ORAL | Status: DC

## 2013-09-10 ENCOUNTER — Ambulatory Visit (HOSPITAL_COMMUNITY)
Admission: RE | Admit: 2013-09-10 | Discharge: 2013-09-10 | Disposition: A | Discharge status: Home / Self Care | Payer: Medicare Other | Source: Ambulatory Visit | Attending: Internal Medicine | Admitting: Internal Medicine

## 2013-09-10 DIAGNOSIS — C9000 Multiple myeloma not having achieved remission: Secondary | ICD-10-CM

## 2013-09-13 ENCOUNTER — Telehealth: Payer: Self-pay

## 2013-09-13 NOTE — Telephone Encounter (Signed)
Shannon Rush called stating pt needs a root canal and Dr Lilia Pro wants to verify if any premeds are needed. What prescriptions are OK for post canal antibiotic and pain control. They usually Rx motrin, hydrocodone and amoxicillin. This in context of pt is post stem cell transplant last August. Dr Juliann Mule will look into her case and we will contact their office tonight or tomorrow.

## 2013-09-13 NOTE — Telephone Encounter (Signed)
S/w Tanya that Dr Juliann Mule said it was OK to proceed with root canal.

## 2013-09-16 ENCOUNTER — Telehealth: Payer: Self-pay | Admitting: Internal Medicine

## 2013-09-16 ENCOUNTER — Telehealth: Payer: Self-pay

## 2013-09-16 NOTE — Telephone Encounter (Signed)
returned pt call and sched nxt appt....pt aware ofn d.t appt

## 2013-09-16 NOTE — Telephone Encounter (Signed)
Pt called at 1120 am First she thanked [place] for the speediness of communication with her orthodontist about her root canal. She asked about what to schedule next. LVM that her next appt should be 3/20 or later with doctor and to receive zometa. Her last visit was 2/20. Told her to call and ask for scheduler.

## 2013-09-20 ENCOUNTER — Other Ambulatory Visit: Payer: Self-pay | Admitting: *Deleted

## 2013-09-20 NOTE — Telephone Encounter (Signed)
THIS REFILL REQUEST FOR REVLIMID WAS GIVEN TO DR.CHISM'S NURSE, ROBIN BASS,RN. 

## 2013-09-22 ENCOUNTER — Other Ambulatory Visit: Payer: Self-pay | Admitting: Medical Oncology

## 2013-09-22 ENCOUNTER — Telehealth: Payer: Self-pay | Admitting: *Deleted

## 2013-09-22 DIAGNOSIS — C9000 Multiple myeloma not having achieved remission: Secondary | ICD-10-CM

## 2013-09-22 DIAGNOSIS — Z9484 Stem cells transplant status: Secondary | ICD-10-CM

## 2013-09-22 MED ORDER — LENALIDOMIDE 5 MG PO CAPS: 5.0000 mg | ORAL_CAPSULE | Freq: Every day | ORAL | Status: DC

## 2013-09-22 NOTE — Telephone Encounter (Signed)
[facility] faxed Revlimid refill request.  Request to provider's in basket for review.

## 2013-09-30 ENCOUNTER — Telehealth: Payer: Self-pay

## 2013-09-30 NOTE — Telephone Encounter (Signed)
Pt missed last night dose of revlimid. I instructed her not to make it up this morning. She will continue tonights dose. She verified tomorrow's appt at [facility]

## 2013-10-01 ENCOUNTER — Ambulatory Visit: Payer: Medicare Other

## 2013-10-01 ENCOUNTER — Ambulatory Visit (HOSPITAL_BASED_OUTPATIENT_CLINIC_OR_DEPARTMENT_OTHER): Payer: Medicare Other

## 2013-10-01 ENCOUNTER — Other Ambulatory Visit (HOSPITAL_BASED_OUTPATIENT_CLINIC_OR_DEPARTMENT_OTHER): Payer: Medicare Other

## 2013-10-01 ENCOUNTER — Telehealth: Payer: Self-pay | Admitting: Internal Medicine

## 2013-10-01 ENCOUNTER — Ambulatory Visit (HOSPITAL_BASED_OUTPATIENT_CLINIC_OR_DEPARTMENT_OTHER): Payer: Medicare Other | Admitting: Internal Medicine

## 2013-10-01 ENCOUNTER — Other Ambulatory Visit: Payer: Self-pay | Admitting: Internal Medicine

## 2013-10-01 VITALS — BP 123/63 | HR 100 | Temp 98.6°F | Resp 18 | Ht 68.0 in | Wt 131.1 lb

## 2013-10-01 DIAGNOSIS — C9000 Multiple myeloma not having achieved remission: Secondary | ICD-10-CM

## 2013-10-01 DIAGNOSIS — Z9481 Bone marrow transplant status: Secondary | ICD-10-CM

## 2013-10-01 DIAGNOSIS — M810 Age-related osteoporosis without current pathological fracture: Secondary | ICD-10-CM

## 2013-10-01 DIAGNOSIS — Z23 Encounter for immunization: Secondary | ICD-10-CM

## 2013-10-01 LAB — COMPREHENSIVE METABOLIC PANEL (CC13)
ALT: 17 U/L (ref 0–55)
AST: 22 U/L (ref 5–34)
Albumin: 3.7 g/dL (ref 3.5–5.0)
Alkaline Phosphatase: 56 U/L (ref 40–150)
Anion Gap: 10 mEq/L (ref 3–11)
BILIRUBIN TOTAL: 0.72 mg/dL (ref 0.20–1.20)
BUN: 12.1 mg/dL (ref 7.0–26.0)
CO2: 23 mEq/L (ref 22–29)
CREATININE: 0.8 mg/dL (ref 0.6–1.1)
Calcium: 9.2 mg/dL (ref 8.4–10.4)
Chloride: 108 mEq/L (ref 98–109)
GLUCOSE: 138 mg/dL (ref 70–140)
Potassium: 3.9 mEq/L (ref 3.5–5.1)
Sodium: 141 mEq/L (ref 136–145)
Total Protein: 6.6 g/dL (ref 6.4–8.3)

## 2013-10-01 LAB — CBC WITH DIFFERENTIAL/PLATELET
BASO%: 0.5 % (ref 0.0–2.0)
Basophils Absolute: 0 10*3/uL (ref 0.0–0.1)
EOS%: 3.3 % (ref 0.0–7.0)
Eosinophils Absolute: 0.1 10*3/uL (ref 0.0–0.5)
HCT: 31.9 % — ABNORMAL LOW (ref 34.8–46.6)
HGB: 10.7 g/dL — ABNORMAL LOW (ref 11.6–15.9)
LYMPH%: 14.5 % (ref 14.0–49.7)
MCH: 35.6 pg — ABNORMAL HIGH (ref 25.1–34.0)
MCHC: 33.6 g/dL (ref 31.5–36.0)
MCV: 106.1 fL — ABNORMAL HIGH (ref 79.5–101.0)
MONO#: 0.4 10*3/uL (ref 0.1–0.9)
MONO%: 16.3 % — ABNORMAL HIGH (ref 0.0–14.0)
NEUT#: 1.5 10*3/uL (ref 1.5–6.5)
NEUT%: 65.4 % (ref 38.4–76.8)
Platelets: 160 10*3/uL (ref 145–400)
RBC: 3 10*6/uL — ABNORMAL LOW (ref 3.70–5.45)
RDW: 14 % (ref 11.2–14.5)
WBC: 2.4 10*3/uL — AB (ref 3.9–10.3)
lymph#: 0.3 10*3/uL — ABNORMAL LOW (ref 0.9–3.3)

## 2013-10-01 LAB — PROTEIN / CREATININE RATIO, URINE
CREATININE, URINE: 130.3 mg/dL
PROTEIN CREATININE RATIO: 0.15 — AB (ref <0.15)
Total Protein, Urine: 19 mg/dL

## 2013-10-01 MED ORDER — ZOLEDRONIC ACID 4 MG/5ML IV CONC
3.5000 mg | Freq: Once | INTRAVENOUS | Status: AC
  Administered 2013-10-01: 3.5 mg via INTRAVENOUS
  Filled 2013-10-01: qty 4.38

## 2013-10-01 MED ORDER — SODIUM CHLORIDE 0.9 % IV SOLN
Freq: Once | INTRAVENOUS | Status: AC
  Administered 2013-10-01: 14:00:00 via INTRAVENOUS

## 2013-10-01 MED ORDER — PNEUMOCOCCAL 13-VAL CONJ VACC IM SUSP
0.5000 mL | Freq: Once | INTRAMUSCULAR | Status: AC
Start: 2013-10-01 — End: 2013-10-01
  Administered 2013-10-01: 0.5 mL via INTRAMUSCULAR
  Filled 2013-10-01: qty 0.5

## 2013-10-01 NOTE — Patient Instructions (Signed)
Pneumococcal Vaccine, Polyvalent suspension for injection - Prevnar What is this medicine? PNEUMOCOCCAL VACCINE, POLYVALENT (NEU mo KOK al vak SEEN, pol ee VEY luhnt) is a vaccine to prevent pneumococcus bacteria infection. These bacteria are a major cause of ear infections, 'Strep throat' infections, and serious pneumonia, meningitis, or blood infections worldwide. These vaccines help the body to produce antibodies (protective substances) that help your body defend against these bacteria. This vaccine is recommended for infants and young children. This vaccine will not treat an infection. This medicine may be used for other purposes; ask your health care provider or pharmacist if you have questions. COMMON BRAND NAME(S): Prevnar 13 , Prevnar What should I tell my health care provider before I take this medicine? They need to know if you have any of these conditions: -bleeding problems -fever -immune system problems -low platelet count in the blood -seizures -an unusual or allergic reaction to pneumococcal vaccine, diphtheria toxoid, other vaccines, latex, other medicines, foods, dyes, or preservatives -pregnant or trying to get pregnant -breast-feeding How should I use this medicine? This vaccine is for injection into a muscle. It is given by a health care professional. A copy of Vaccine Information Statements will be given before each vaccination. Read this sheet carefully each time. The sheet may change frequently. Talk to your pediatrician regarding the use of this medicine in children. While this drug may be prescribed for children as young as 93 weeks old for selected conditions, precautions do apply. Overdosage: If you think you have taken too much of this medicine contact a poison control center or emergency room at once. NOTE: This medicine is only for you. Do not share this medicine with others. What if I miss a dose? It is important not to miss your dose. Call your doctor or health  care professional if you are unable to keep an appointment. What may interact with this medicine? -medicines for cancer chemotherapy -medicines that suppress your immune function -medicines that treat or prevent blood clots like warfarin, enoxaparin, and dalteparin -steroid medicines like prednisone or cortisone This list may not describe all possible interactions. Give your health care provider a list of all the medicines, herbs, non-prescription drugs, or dietary supplements you use. Also tell them if you smoke, drink alcohol, or use illegal drugs. Some items may interact with your medicine. What should I watch for while using this medicine? Mild fever and pain should go away in 3 days or less. Report any unusual symptoms to your doctor or health care professional. What side effects may I notice from receiving this medicine? Side effects that you should report to your doctor or health care professional as soon as possible: -allergic reactions like skin rash, itching or hives, swelling of the face, lips, or tongue -breathing problems -confused -fever over 102 degrees F -pain, tingling, numbness in the hands or feet -seizures -unusual bleeding or bruising -unusual muscle weakness Side effects that usually do not require medical attention (report to your doctor or health care professional if they continue or are bothersome): -aches and pains -diarrhea -fever of 102 degrees F or less -headache -irritable -loss of appetite -pain, tender at site where injected -trouble sleeping This list may not describe all possible side effects. Call your doctor for medical advice about side effects. You may report side effects to FDA at 1-800-FDA-1088. Where should I keep my medicine? This does not apply. This vaccine is given in a clinic, pharmacy, doctor's office, or other health care setting and will not be  stored at home. NOTE: This sheet is a summary. It may not cover all possible information. If  you have questions about this medicine, talk to your doctor, pharmacist, or health care provider.  2014, Elsevier/Gold Standard. (2008-09-13 10:17:22)  Zoledronic Acid injection (Hypercalcemia, Oncology) - Zometa What is this medicine? ZOLEDRONIC ACID (ZOE le dron ik AS id) lowers the amount of calcium loss from bone. It is used to treat too much calcium in your blood from cancer. It is also used to prevent complications of cancer that has spread to the bone. This medicine may be used for other purposes; ask your health care provider or pharmacist if you have questions. COMMON BRAND NAME(S): Zometa What should I tell my health care provider before I take this medicine? They need to know if you have any of these conditions: -aspirin-sensitive asthma -cancer, especially if you are receiving medicines used to treat cancer -dental disease or wear dentures -infection -kidney disease -receiving corticosteroids like dexamethasone or prednisone -an unusual or allergic reaction to zoledronic acid, other medicines, foods, dyes, or preservatives -pregnant or trying to get pregnant -breast-feeding How should I use this medicine? This medicine is for infusion into a vein. It is given by a health care professional in a hospital or clinic setting. Talk to your pediatrician regarding the use of this medicine in children. Special care may be needed. Overdosage: If you think you have taken too much of this medicine contact a poison control center or emergency room at once. NOTE: This medicine is only for you. Do not share this medicine with others. What if I miss a dose? It is important not to miss your dose. Call your doctor or health care professional if you are unable to keep an appointment. What may interact with this medicine? -certain antibiotics given by injection -NSAIDs, medicines for pain and inflammation, like ibuprofen or naproxen -some diuretics like bumetanide,  furosemide -teriparatide -thalidomide This list may not describe all possible interactions. Give your health care provider a list of all the medicines, herbs, non-prescription drugs, or dietary supplements you use. Also tell them if you smoke, drink alcohol, or use illegal drugs. Some items may interact with your medicine. What should I watch for while using this medicine? Visit your doctor or health care professional for regular checkups. It may be some time before you see the benefit from this medicine. Do not stop taking your medicine unless your doctor tells you to. Your doctor may order blood tests or other tests to see how you are doing. Women should inform their doctor if they wish to become pregnant or think they might be pregnant. There is a potential for serious side effects to an unborn child. Talk to your health care professional or pharmacist for more information. You should make sure that you get enough calcium and vitamin D while you are taking this medicine. Discuss the foods you eat and the vitamins you take with your health care professional. Some people who take this medicine have severe bone, joint, and/or muscle pain. This medicine may also increase your risk for jaw problems or a broken thigh bone. Tell your doctor right away if you have severe pain in your jaw, bones, joints, or muscles. Tell your doctor if you have any pain that does not go away or that gets worse. Tell your dentist and dental surgeon that you are taking this medicine. You should not have major dental surgery while on this medicine. See your dentist to have a dental exam  and fix any dental problems before starting this medicine. Take good care of your teeth while on this medicine. Make sure you see your dentist for regular follow-up appointments. What side effects may I notice from receiving this medicine? Side effects that you should report to your doctor or health care professional as soon as possible: -allergic  reactions like skin rash, itching or hives, swelling of the face, lips, or tongue -anxiety, confusion, or depression -breathing problems -changes in vision -eye pain -feeling faint or lightheaded, falls -jaw pain, especially after dental work -mouth sores -muscle cramps, stiffness, or weakness -trouble passing urine or change in the amount of urine Side effects that usually do not require medical attention (report to your doctor or health care professional if they continue or are bothersome): -bone, joint, or muscle pain -constipation -diarrhea -fever -hair loss -irritation at site where injected -loss of appetite -nausea, vomiting -stomach upset -trouble sleeping -trouble swallowing -weak or tired This list may not describe all possible side effects. Call your doctor for medical advice about side effects. You may report side effects to FDA at 1-800-FDA-1088. Where should I keep my medicine? This drug is given in a hospital or clinic and will not be stored at home. NOTE: This sheet is a summary. It may not cover all possible information. If you have questions about this medicine, talk to your doctor, pharmacist, or health care provider.  2014, Elsevier/Gold Standard. (2012-12-10 13:03:13)

## 2013-10-01 NOTE — Progress Notes (Signed)
Patient treated in infusion room.

## 2013-10-01 NOTE — Telephone Encounter (Signed)
gv adn printed aptp sched and avs for pt for April....sed added tx. °

## 2013-10-04 NOTE — Progress Notes (Signed)
[place], MD [place] [address]  DIAGNOSIS: Multiple myeloma - Plan: CBC with Differential, Comprehensive metabolic panel (Cmet) - [facility], CBC with Differential, IgG, IgA, IgM, Kappa/lambda light chains  Chief Complaint  Patient presents with  . Multiple Myeloma    CURRENT TREATMENT:  Zometa 3.5 mg monthly;  Revlimide 5 mg daily for maintenance for at least 2 years started on 09/02/2013.     Multiple myeloma   05/26/2012 Initial Diagnosis Presenting IgG was 4,040 mg/dL on 06/05/2012 (IgA 35; IgM 34); kappa free light chain 34.4 mg/dL, lambda 0.00, kappa:lambda ratio of 34.75; SPEP with M-spike of 2.60.   06/30/2012 Imaging Numerous lytic lesions throughout the calvarium.  Lytic lesion within the left lateral scapula.  Findings most compatible with metastases or myeloma. Multiple mid and lower thoracic compression fractures.  Slight compression through the endplates at L3.   27/12/2374 Bone Marrow Biopsy Bone marrow biopsy showed 49% plasma cell. Normal classical cytogenetics; however, myeloma FISH panel showed 13q- (intermediate risk)       07/14/2012 - 07/27/2012 Radiation Therapy Palliative radiation 20 Gy over 10 fractions between to thoracic spine cord compression and symptomatic left scapula lytic lesion.   08/10/2012 -  Chemotherapy Started SQ Velcade once weekly, 3 weeks on, 1 weeks off; daily Revlimid d1-21, 7 days off; and Dexamethasone 17m PO weekly.    02[facility]12[facility]2013 [facility]    06/14/2013 Tumor Marker IgG  1830,  Kappa:lamba ratio, 1.69 (baseline was 34.75 or   M-spike 0.28 (baseline of 2.6 or  89.3% of baseline).     06/25/2013 -  Chemotherapy Started zometa 3.5 mg monthly    09/01/2013 -  Chemotherapy Maintenance therapy with revlimid 595mdaily.    INTERVAL HISTORY: Shannon HENDLEY[age] female with a past medical history of MM as detailed above,  s/p SCT is here for follow-up.  She reports returning from her trip to [place].  She reports having a wonderful time without any hospitalizations or emergency room visits.  She also reports being seen by Dr. GaSamule Ohmith recommendations to start revlidmid maintenance which she started on yesterday.  She denies any fevers or chills.  She is planning a one-week cruise.  She denies any symptoms of anemia.   MEDICAL HISTORY: Past Medical History  Diagnosis Date  . Multiple myeloma 07/01/2012  . Unspecified deficiency anemia   . OSTEOPOROSIS 06/11/2010    Multiple compression fractures; and spontaneous fracture of sternum Qualifier: Diagnosis of  By: SaZebedee IbaP, SuManuela Schwartz   . 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 Cord compression 07/13/12    MRI- diffuse myeloma involvement of T-L spine  . History of radiation therapy 07/13/12-07/27/12    spinal cord compression T3-T10,left scapula    INTERIM HISTORY: has Anemia, macrocytic; DJD, UNSPECIFIED; OSTEOPOROSIS; KYPHOSIS; LUMBOSACRAL STRAIN; Sciatica of left side; Abdominal pain, acute, right upper quadrant; Thoracic back pain; Multiple myeloma; Cord compression; Abnormal echocardiogram; Swelling, mass, or lump in chest; Abnormal electrocardiogram; History of peripheral stem cell transplant; and Unspecified vitamin D deficiency on her problem list.    ALLERGIES:  is allergic to zithromax and ciprofloxacin.  MEDICATIONS: has a current medication list which includes the following prescription(s): acyclovir, calcium carbonate, hydrocodone-acetaminophen, hydrocortisone cream, lenalidomide, morphine, morphine, and ondansetron.  SURGICAL HISTORY:  Past Surgical History  Procedure Laterality Date  . Appendectomy    . Tubal  ligation    . Cesarean section      x2   . Hip surgery  2009    left  . Elbow surgery    . Colonoscopy  2007    neg with Dr. [name]    REVIEW OF SYSTEMS:   Constitutional: Denies abnormal weight loss Eyes:  Denies blurriness of vision Ears, nose, mouth, throat, and face: Denies mucositis or sore throat Respiratory: Denies cough, dyspnea or wheezes Cardiovascular: Denies palpitation, chest discomfort or lower extremity swelling Gastrointestinal:  Denies nausea, heartburn or change in bowel habits Skin: Denies abnormal skin rashes Lymphatics: Denies new lymphadenopathy or easy bruising Neurological:Denies numbness, tingling or new weaknesses Behavioral/Psych: Mood is stable, no new changes  All other systems were reviewed with the patient and are negative.  PHYSICAL EXAMINATION: ECOG PERFORMANCE STATUS: 0 - Asymptomatic  Blood pressure 123/63, pulse 100, temperature 98.6 F (37 C), temperature source Oral, resp. rate 18, height 5' 8"  (1.727 m), weight 131 lb 1.6 oz (59.467 kg).  GENERAL:alert, no distress and comfortable; easily mobile to exam table; kyphosis, moderate.  SKIN: skin color, texture, turgor are normal, no rashes or significant lesions EYES: normal, Conjunctiva are pink and non-injected, sclera clear OROPHARYNX:no exudate, no erythema and lips, buccal mucosa, and tongue normal  NECK: supple, thyroid normal size, non-tender, without nodularity LYMPH:  no palpable lymphadenopathy in the cervical, axillary or supraclavicular LUNGS: clear to auscultation with normal breathing effort, no wheezes or rhonchi HEART: regular rate & rhythm and no murmurs and no lower extremity edema ABDOMEN:abdomen soft, non-tender and normal bowel sounds Musculoskeletal:no cyanosis of digits and no clubbing  NEURO: alert & oriented x 3 with fluent speech, no focal motor/sensory deficits  Labs:  Lab Results  Component Value Date   WBC 2.4* 10/01/2013   HGB 10.7* 10/01/2013   HCT 31.9* 10/01/2013   MCV 106.1* 10/01/2013   PLT 160 10/01/2013   NEUTROABS 1.5 10/01/2013      Chemistry      Component Value Date/Time   NA 141 10/01/2013 1151   NA 133* 05/08/2012 0922   K 3.9 10/01/2013 1151   K 4.2  05/08/2012 0922   CL 106 11/26/2012 0936   CL 100 05/08/2012 0922   CO2 23 10/01/2013 1151   CO2 27 05/08/2012 0922   BUN 12.1 10/01/2013 1151   BUN 20 05/08/2012 0922   CREATININE 0.8 10/01/2013 1151   CREATININE 0.67 07/13/2012 1310   CREATININE 0.88 05/08/2012 0922   CREATININE 0.55 04/01/2011 0250      Component Value Date/Time   CALCIUM 9.2 10/01/2013 1151   CALCIUM 9.4 05/08/2012 0922   ALKPHOS 56 10/01/2013 1151   ALKPHOS 77 05/08/2012 0922   AST 22 10/01/2013 1151   AST 21 05/08/2012 0922   ALT 17 10/01/2013 1151   ALT 15 05/08/2012 0922   BILITOT 0.72 10/01/2013 1151   BILITOT 0.6 05/08/2012 0922     CBC:  Recent Labs Lab 10/01/13 1151  WBC 2.4*  NEUTROABS 1.5  HGB 10.7*  HCT 31.9*  MCV 106.1*  PLT 160   Studies:  No results found.   RADIOGRAPHIC STUDIES: METASTATIC BONE SURVEY  COMPARISON: DG BONE SURVEY MET dated 06/30/2012  FINDINGS:  Multiple lytic lesions are noted throughout the skull. These are  stable in appearance and consistent with the patient's history of  multiple myeloma. Progressive mid and lower thoracic spine  compression fractures are noted. No prominent retropulsed fragments  noted. Persistent left scapular lesion is present. Left posterior  rib lytic lesions are present consistent with myeloma. Lytic lesion  of the distal left scapula. Deformity of the left clavicle is noted.  Focal lesion with associated healing fracture could present in this  fashion.  IMPRESSION:  1. Persistent unchanged multiple skull lesions.  2. Persistent left scapular lesion.  3. Lytic lesion in the distal right clavicle. Deformity of the left  clavicle consistent with focal lesion with associated fracture and  healing.  4. Progressive thoracic spine compression fractures. No retropulsed  fragments noted.  5. Multiple lytic left posterior rib lesions.  These findings are all consistent with the patient's history of  multiple myeloma  ASSESSMENT: Shannon Rush 73 y.o. female with a history of Multiple myeloma - Plan: CBC with Differential, Comprehensive metabolic panel (Cmet) - [facility], CBC with Differential, IgG, IgA, IgM, Kappa/lambda light chains   PLAN:  1. IgG kappa Multiple myeloma s/p Auto SCT, intermediate risk.  --Today's labs demonstrate a decrease in her WBCs and Hbg likely secondary to revlimide.  We discussed being off of her revlimid while on her cruise but she desired to take it every other day.   We will repeat her labs upon her return.   - Acyclovir 471m PO daily to prevent risk of viral reactivation for one year post transplant.  -- She started revlimid 5 mg daily for maintenance on 09/02/2013.  -- We restarted zometa 3.5 mg q 4 weeks on 06/25/2013. She will receive an infusion today.  --Now 7 months from SCT. --Skeletal survey as noted above.   2. Osteoporosis.  -- [facility]. She will continue calcium, vitamin D and weight bearing exercises. She will continue zometa as noted above.    3. Follow-up.  She will follow-up with [facility] 4 weeks with a repeat CBC, CMET, Magnesium.  Repeat labs in one week to assess trend given recent decrease while on revlimide. Zometa next visit.   All questions were answered. The patient knows to call the clinic with any problems, questions or concerns. We can certainly see the patient much sooner if necessary.  I spent 15 minutes counseling the patient face to face. The total time spent in the appointment was 25 minutes.    Shannon Cristiano, MD 10/04/2013 6:26 AM

## 2013-10-06 LAB — SPEP & IFE WITH QIG
Albumin ELP: 61.8 % (ref 55.8–66.1)
Alpha-1-Globulin: 4.5 % (ref 2.9–4.9)
Alpha-2-Globulin: 8.6 % (ref 7.1–11.8)
BETA GLOBULIN: 5.5 % (ref 4.7–7.2)
Beta 2: 2.2 % — ABNORMAL LOW (ref 3.2–6.5)
Gamma Globulin: 17.4 % (ref 11.1–18.8)
IGG (IMMUNOGLOBIN G), SERUM: 1240 mg/dL (ref 690–1700)
IgA: 54 mg/dL — ABNORMAL LOW (ref 69–380)
IgM, Serum: 41 mg/dL — ABNORMAL LOW (ref 52–322)
M-SPIKE, %: 0.18 g/dL
Total Protein, Serum Electrophoresis: 5.9 g/dL — ABNORMAL LOW (ref 6.0–8.3)

## 2013-10-15 LAB — UIFE/LIGHT CHAINS/TP QN, 24-HR UR
ALBUMIN, U: DETECTED
ALPHA 1 UR: DETECTED — AB
Alpha 2, Urine: DETECTED — AB
BETA UR: DETECTED — AB
FREE LAMBDA EXCRETION/DAY: 3.91 mg/d
Free Kappa Lt Chains,Ur: 3.03 mg/dL — ABNORMAL HIGH (ref 0.14–2.42)
Free Kappa/Lambda Ratio: 13.17 ratio — ABNORMAL HIGH (ref 2.04–10.37)
Free Lambda Lt Chains,Ur: 0.23 mg/dL (ref 0.02–0.67)
Free Lt Chn Excr Rate: 51.51 mg/d
GAMMA UR: DETECTED — AB
Time: 24 hours
Total Protein, Urine-Ur/day: 65 mg/d (ref 10–140)
Total Protein, Urine: 3.8 mg/dL
Volume, Urine: 1700 mL

## 2013-10-18 ENCOUNTER — Other Ambulatory Visit (HOSPITAL_BASED_OUTPATIENT_CLINIC_OR_DEPARTMENT_OTHER): Payer: Medicare Other

## 2013-10-18 DIAGNOSIS — C9000 Multiple myeloma not having achieved remission: Secondary | ICD-10-CM

## 2013-10-18 LAB — CBC WITH DIFFERENTIAL/PLATELET
BASO%: 1.6 % (ref 0.0–2.0)
Basophils Absolute: 0 10*3/uL (ref 0.0–0.1)
EOS%: 5.4 % (ref 0.0–7.0)
Eosinophils Absolute: 0.1 10*3/uL (ref 0.0–0.5)
HEMATOCRIT: 34.2 % — AB (ref 34.8–46.6)
HGB: 11.5 g/dL — ABNORMAL LOW (ref 11.6–15.9)
LYMPH%: 21.5 % (ref 14.0–49.7)
MCH: 35.6 pg — ABNORMAL HIGH (ref 25.1–34.0)
MCHC: 33.6 g/dL (ref 31.5–36.0)
MCV: 105.9 fL — ABNORMAL HIGH (ref 79.5–101.0)
MONO#: 0.4 10*3/uL (ref 0.1–0.9)
MONO%: 17.3 % — ABNORMAL HIGH (ref 0.0–14.0)
NEUT#: 1.2 10*3/uL — ABNORMAL LOW (ref 1.5–6.5)
NEUT%: 54.2 % (ref 38.4–76.8)
PLATELETS: 148 10*3/uL (ref 145–400)
RBC: 3.23 10*6/uL — ABNORMAL LOW (ref 3.70–5.45)
RDW: 14.5 % (ref 11.2–14.5)
WBC: 2.3 10*3/uL — AB (ref 3.9–10.3)
lymph#: 0.5 10*3/uL — ABNORMAL LOW (ref 0.9–3.3)

## 2013-10-26 ENCOUNTER — Other Ambulatory Visit: Payer: Self-pay | Admitting: Medical Oncology

## 2013-10-26 ENCOUNTER — Telehealth: Payer: Self-pay | Admitting: Family Medicine

## 2013-10-26 ENCOUNTER — Telehealth: Payer: Self-pay | Admitting: Medical Oncology

## 2013-10-26 DIAGNOSIS — C9000 Multiple myeloma not having achieved remission: Secondary | ICD-10-CM

## 2013-10-26 NOTE — Telephone Encounter (Signed)
Spoke with Dr. Nori Riis and she advised me to  Inform patient that she needs to contact her oncologist. Patient has been notified.

## 2013-10-26 NOTE — Telephone Encounter (Signed)
Pt called and states that she has continued with a cough, sinus congestion and low grade fever. She states that she gets better or a few days and then it comes right back. I asked her how high her fevers have been. She states 100.5 is the highest. I informed her that Dr. Juliann Mule would like to get a CBC to check her WBC and get a chest x-ray. POF made. She voiced understanding.

## 2013-10-26 NOTE — Telephone Encounter (Signed)
Pt called and has some concerns with her health. She has had cold symptoms for about three weeks sometimes with fever. The issue she is having is that she has had stem cell transplants and she is also in remission and is not sure if this a side effect or not. She would like Dr. Andria Frames to call her and give her advise on what she should do. Please call [phone]. jw

## 2013-10-27 ENCOUNTER — Telehealth: Payer: Self-pay | Admitting: Medical Oncology

## 2013-10-27 ENCOUNTER — Ambulatory Visit (HOSPITAL_COMMUNITY)
Admission: RE | Admit: 2013-10-27 | Discharge: 2013-10-27 | Disposition: A | Discharge status: Home / Self Care | Payer: Medicare Other | Source: Ambulatory Visit | Attending: Internal Medicine | Admitting: Internal Medicine

## 2013-10-27 ENCOUNTER — Telehealth: Payer: Self-pay | Admitting: Internal Medicine

## 2013-10-27 ENCOUNTER — Other Ambulatory Visit (HOSPITAL_BASED_OUTPATIENT_CLINIC_OR_DEPARTMENT_OTHER): Payer: Medicare Other

## 2013-10-27 DIAGNOSIS — R509 Fever, unspecified: Secondary | ICD-10-CM | POA: Insufficient documentation

## 2013-10-27 DIAGNOSIS — R05 Cough: Secondary | ICD-10-CM | POA: Insufficient documentation

## 2013-10-27 DIAGNOSIS — C9000 Multiple myeloma not having achieved remission: Secondary | ICD-10-CM

## 2013-10-27 DIAGNOSIS — R059 Cough, unspecified: Secondary | ICD-10-CM | POA: Insufficient documentation

## 2013-10-27 DIAGNOSIS — R0989 Other specified symptoms and signs involving the circulatory and respiratory systems: Secondary | ICD-10-CM | POA: Insufficient documentation

## 2013-10-27 LAB — CBC WITH DIFFERENTIAL/PLATELET
BASO%: 0.8 % (ref 0.0–2.0)
Basophils Absolute: 0.1 10*3/uL (ref 0.0–0.1)
EOS%: 3.7 % (ref 0.0–7.0)
Eosinophils Absolute: 0.2 10*3/uL (ref 0.0–0.5)
HEMATOCRIT: 32.8 % — AB (ref 34.8–46.6)
HGB: 11.2 g/dL — ABNORMAL LOW (ref 11.6–15.9)
LYMPH%: 8.9 % — ABNORMAL LOW (ref 14.0–49.7)
MCH: 35.7 pg — ABNORMAL HIGH (ref 25.1–34.0)
MCHC: 34 g/dL (ref 31.5–36.0)
MCV: 105.1 fL — ABNORMAL HIGH (ref 79.5–101.0)
MONO#: 0.6 10*3/uL (ref 0.1–0.9)
MONO%: 9.3 % (ref 0.0–14.0)
NEUT#: 4.7 10*3/uL (ref 1.5–6.5)
NEUT%: 77.3 % — ABNORMAL HIGH (ref 38.4–76.8)
Platelets: 221 10*3/uL (ref 145–400)
RBC: 3.12 10*6/uL — ABNORMAL LOW (ref 3.70–5.45)
RDW: 14.1 % (ref 11.2–14.5)
WBC: 6.1 10*3/uL (ref 3.9–10.3)
lymph#: 0.5 10*3/uL — ABNORMAL LOW (ref 0.9–3.3)

## 2013-10-27 NOTE — Telephone Encounter (Signed)
Follow up. Left message on voice mail.

## 2013-10-29 ENCOUNTER — Ambulatory Visit (HOSPITAL_BASED_OUTPATIENT_CLINIC_OR_DEPARTMENT_OTHER): Payer: Medicare Other | Admitting: Internal Medicine

## 2013-10-29 ENCOUNTER — Ambulatory Visit (HOSPITAL_BASED_OUTPATIENT_CLINIC_OR_DEPARTMENT_OTHER): Payer: Medicare Other

## 2013-10-29 ENCOUNTER — Other Ambulatory Visit (HOSPITAL_BASED_OUTPATIENT_CLINIC_OR_DEPARTMENT_OTHER): Payer: Medicare Other

## 2013-10-29 VITALS — BP 116/65 | HR 86 | Temp 97.3°F | Resp 18 | Ht 68.0 in | Wt 124.0 lb

## 2013-10-29 DIAGNOSIS — C9000 Multiple myeloma not having achieved remission: Secondary | ICD-10-CM

## 2013-10-29 DIAGNOSIS — M810 Age-related osteoporosis without current pathological fracture: Secondary | ICD-10-CM

## 2013-10-29 LAB — COMPREHENSIVE METABOLIC PANEL (CC13)
ALT: 22 U/L (ref 0–55)
ANION GAP: 9 meq/L (ref 3–11)
AST: 23 U/L (ref 5–34)
Albumin: 3.3 g/dL — ABNORMAL LOW (ref 3.5–5.0)
Alkaline Phosphatase: 149 U/L (ref 40–150)
BILIRUBIN TOTAL: 0.56 mg/dL (ref 0.20–1.20)
BUN: 12.4 mg/dL (ref 7.0–26.0)
CO2: 25 mEq/L (ref 22–29)
Calcium: 10 mg/dL (ref 8.4–10.4)
Chloride: 104 mEq/L (ref 98–109)
Creatinine: 0.7 mg/dL (ref 0.6–1.1)
Glucose: 93 mg/dl (ref 70–140)
Potassium: 4.1 mEq/L (ref 3.5–5.1)
Sodium: 138 mEq/L (ref 136–145)
Total Protein: 7.5 g/dL (ref 6.4–8.3)

## 2013-10-29 LAB — CBC WITH DIFFERENTIAL/PLATELET
BASO%: 0.9 % (ref 0.0–2.0)
Basophils Absolute: 0.1 10*3/uL (ref 0.0–0.1)
EOS%: 2.4 % (ref 0.0–7.0)
Eosinophils Absolute: 0.1 10*3/uL (ref 0.0–0.5)
HEMATOCRIT: 31.8 % — AB (ref 34.8–46.6)
HGB: 10.7 g/dL — ABNORMAL LOW (ref 11.6–15.9)
LYMPH%: 10.2 % — ABNORMAL LOW (ref 14.0–49.7)
MCH: 34.5 pg — AB (ref 25.1–34.0)
MCHC: 33.6 g/dL (ref 31.5–36.0)
MCV: 102.6 fL — ABNORMAL HIGH (ref 79.5–101.0)
MONO#: 0.7 10*3/uL (ref 0.1–0.9)
MONO%: 11.8 % (ref 0.0–14.0)
NEUT#: 4.4 10*3/uL (ref 1.5–6.5)
NEUT%: 74.7 % (ref 38.4–76.8)
NRBC: 0 % (ref 0–0)
PLATELETS: 190 10*3/uL (ref 145–400)
RBC: 3.1 10*6/uL — AB (ref 3.70–5.45)
RDW: 14.2 % (ref 11.2–14.5)
WBC: 5.9 10*3/uL (ref 3.9–10.3)
lymph#: 0.6 10*3/uL — ABNORMAL LOW (ref 0.9–3.3)

## 2013-10-29 MED ORDER — SODIUM CHLORIDE 0.9 % IV SOLN
Freq: Once | INTRAVENOUS | Status: AC
  Administered 2013-10-29: 13:00:00 via INTRAVENOUS

## 2013-10-29 MED ORDER — ZOLEDRONIC ACID 4 MG/5ML IV CONC
3.5000 mg | Freq: Once | INTRAVENOUS | Status: AC
  Administered 2013-10-29: 3.5 mg via INTRAVENOUS
  Filled 2013-10-29: qty 4.38

## 2013-10-29 NOTE — Patient Instructions (Signed)

## 2013-10-29 NOTE — Progress Notes (Signed)
[place], MD [address] [place] [address]  DIAGNOSIS: Multiple myeloma  Chief Complaint  Patient presents with  . Follow-up    CURRENT TREATMENT:  Zometa 3.5 mg monthly;  Revlimide 5 mg every other day for maintenance for at least 2 years started on 09/02/2013.     Multiple myeloma   05/26/2012 Initial Diagnosis Presenting IgG was 4,040 mg/dL on 06/05/2012 (IgA 35; IgM 34); kappa free light chain 34.4 mg/dL, lambda 0.00, kappa:lambda ratio of 34.75; SPEP with M-spike of 2.60.   06/30/2012 Imaging Numerous lytic lesions throughout the calvarium.  Lytic lesion within the left lateral scapula.  Findings most compatible with metastases or myeloma. Multiple mid and lower thoracic compression fractures.  Slight compression through the endplates at L3.   68/05/5725 Bone Marrow Biopsy Bone marrow biopsy showed 49% plasma cell. Normal classical cytogenetics; however, myeloma FISH panel showed 13q- (intermediate risk)       07/14/2012 - 07/27/2012 Radiation Therapy Palliative radiation 20 Gy over 10 fractions between to thoracic spine cord compression and symptomatic left scapula lytic lesion.   08/10/2012 -  Chemotherapy Started SQ Velcade once weekly, 3 weeks on, 1 weeks off; daily Revlimid d1-21, 7 days off; and Dexamethasone 72m PO weekly.    02/12/2013 [facility]    06/14/2013 Tumor Marker IgG  1830,  Kappa:lamba ratio, 1.69 (baseline was 34.75 or   M-spike 0.28 (baseline of 2.6 or  89.3% of baseline).     06/25/2013 -  Chemotherapy Started zometa 3.5 mg monthly    09/01/2013 -  Chemotherapy Maintenance therapy with revlimid 725mdaily.    INTERVAL HISTORY: [name]73.o. female with a past medical history of MM as detailed above, s/p SCT is here for follow-up.  She had a cold sore despite being on acyclovir prophylaxis that lasted about 2 weeks, occuring on her  bottom lower lip.  It has completely resolved.  She remains on revlimid 5 mg every other day for maintenance for the least 2 years started on 09/02/2013.  She visited an orthodontic for root canal therapy with a last visit 10 days ago.  She continues to have some discomfort.  She describes that it kind of hurts, not temperature sensitive.  It is in the lower left without any findings associated on the xray.  She denies any symptoms of anemia outside of fatigue.  She is still riding her bike 10 1/2 miles this past Wednesday.  She lost 5 lbs since her last visit.  She reports a decreased appetite.     MEDICAL HISTORY: Past Medical History  Diagnosis Date  . Multiple myeloma 07/01/2012  . Unspecified deficiency anemia   . OSTEOPOROSIS 06/11/2010    Multiple compression fractures; and spontaneous fracture of sternum Qualifier: Diagnosis of  By: SaZebedee IbaP, SuManuela Schwartz   . 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 Cord compression 07/13/12    MRI- diffuse myeloma involvement of T-L spine  . History of radiation therapy 07/13/12-07/27/12    spinal cord compression T3-T10,left scapula    INTERIM HISTORY: has Anemia, macrocytic; DJD, UNSPECIFIED; OSTEOPOROSIS; KYPHOSIS; LUMBOSACRAL STRAIN; Sciatica of left side; Abdominal pain, acute, right upper quadrant; Thoracic back pain; Multiple myeloma; Cord compression; Abnormal echocardiogram; Swelling, mass, or lump in chest; Abnormal electrocardiogram; History of peripheral stem cell transplant; and Unspecified vitamin D deficiency on her problem list.  ALLERGIES:  is allergic to zithromax and ciprofloxacin.  MEDICATIONS: has a current medication list which includes the following prescription(s): acyclovir, calcium carbonate, hydrocodone-acetaminophen, hydrocortisone cream, lenalidomide, morphine, morphine, and ondansetron.  SURGICAL HISTORY:  Past Surgical History  Procedure Laterality Date  . Appendectomy    . Tubal ligation    . Cesarean section       x2   . Hip surgery  2009    left  . Elbow surgery    . Colonoscopy  2007    neg with Dr. [name]    REVIEW OF SYSTEMS:   Constitutional: Denies abnormal weight loss Eyes: Denies blurriness of vision Ears, nose, mouth, throat, and face: Denies mucositis or sore throat Respiratory: Denies cough, dyspnea or wheezes Cardiovascular: Denies palpitation, chest discomfort or lower extremity swelling Gastrointestinal:  Denies nausea, heartburn or change in bowel habits Skin: Denies abnormal skin rashes Lymphatics: Denies new lymphadenopathy or easy bruising Neurological:Denies numbness, tingling or new weaknesses Behavioral/Psych: Mood is stable, no new changes  All other systems were reviewed with the patient and are negative.  PHYSICAL EXAMINATION: ECOG PERFORMANCE STATUS: 0 - Asymptomatic  Blood pressure 116/65, pulse 86, temperature 97.3 F (36.3 C), temperature source Oral, resp. rate 18, height _0  (1.727 m), weight 124 lb (56.246 kg), SpO2 98.00%.  GENERAL:alert, no distress and comfortable; easily mobile to exam table; kyphosis, moderate.  SKIN: skin color, texture, turgor are normal, no rashes or significant lesions EYES: normal, Conjunctiva are pink and non-injected, sclera clear OROPHARYNX:no exudate, no erythema and lips, buccal mucosa, and tongue normal  NECK: supple, thyroid normal size, non-tender, without nodularity; nodules cervical bilaterally.  LYMPH:  no palpable lymphadenopathy in the cervical, axillary or supraclavicular LUNGS: clear to auscultation with normal breathing effort, no wheezes or rhonchi HEART: regular rate & rhythm and no murmurs and no lower extremity edema ABDOMEN:abdomen soft, non-tender and normal bowel sounds Musculoskeletal:no cyanosis of digits and no clubbing  NEURO: alert & oriented x 3 with fluent speech, no focal motor/sensory deficits  Labs:  Lab Results  Component Value Date   WBC 5.9 10/29/2013   HGB 10.7* 10/29/2013   HCT 31.8*  10/29/2013   MCV 102.6* 10/29/2013   PLT 190 10/29/2013   NEUTROABS 4.4 10/29/2013      Chemistry      Component Value Date/Time   NA 141 10/01/2013 1151   NA 133* 05/08/2012 0922   K 3.9 10/01/2013 1151   K 4.2 05/08/2012 0922   CL 106 11/26/2012 0936   CL 100 05/08/2012 0922   CO2 23 10/01/2013 1151   CO2 27 05/08/2012 0922   BUN 12.1 10/01/2013 1151   BUN 20 05/08/2012 0922   CREATININE 0.8 10/01/2013 1151   CREATININE 0.67 07/13/2012 1310   CREATININE 0.88 05/08/2012 0922   CREATININE 0.55 04/01/2011 0250      Component Value Date/Time   CALCIUM 9.2 10/01/2013 1151   CALCIUM 9.4 05/08/2012 0922   ALKPHOS 56 10/01/2013 1151   ALKPHOS 77 05/08/2012 0922   AST 22 10/01/2013 1151   AST 21 05/08/2012 0922   ALT 17 10/01/2013 1151   ALT 15 05/08/2012 0922   BILITOT 0.72 10/01/2013 1151   BILITOT 0.6 05/08/2012 0922     CBC:  Recent Labs Lab 10/27/13 0834 10/29/13 1109  WBC 6.1 5.9  NEUTROABS 4.7 4.4  HGB 11.2* 10.7*  HCT 32.8* 31.8*  MCV 105.1* 102.6*  PLT 221 190   Studies:  No results found.   RADIOGRAPHIC STUDIES: METASTATIC  BONE SURVEY  COMPARISON: DG BONE SURVEY MET dated 06/30/2012  FINDINGS:  Multiple lytic lesions are noted throughout the skull. These are  stable in appearance and consistent with the patient's history of  multiple myeloma. Progressive mid and lower thoracic spine  compression fractures are noted. No prominent retropulsed fragments  noted. Persistent left scapular lesion is present. Left posterior  rib lytic lesions are present consistent with myeloma. Lytic lesion  of the distal left scapula. Deformity of the left clavicle is noted.  Focal lesion with associated healing fracture could present in this  fashion.  IMPRESSION:  1. Persistent unchanged multiple skull lesions.  2. Persistent left scapular lesion.  3. Lytic lesion in the distal right clavicle. Deformity of the left  clavicle consistent with focal lesion with associated fracture  and  healing.  4. Progressive thoracic spine compression fractures. No retropulsed  fragments noted.  5. Multiple lytic left posterior rib lesions.  These findings are all consistent with the patient's history of  multiple myeloma  ASSESSMENT: Shannon Rush 73 y.o. female with a history of Multiple myeloma   PLAN:  1. IgG kappa Multiple myeloma s/p Auto SCT, intermediate risk.  --Today's labs demonstrate an improvement WBCs and Hbg likely secondary to revlimid being every other day.   We discussed being off of her revlimid while on her cruise but she desired to take it every other day.   We will repeat her labs upon her return.   - Acyclovir 464m PO daily to prevent risk of viral reactivation for one year post transplant.  -- She started revlimid 5 mg daily for maintenance on 09/02/2013.  -- We restarted zometa 3.5 mg q 4 weeks on 06/25/2013. She will receive an infusion today.  --Now 8 months from SCT. --Skeletal survey as noted above.   2. Osteoporosis.  -- [facility]. She will continue calcium, vitamin D and weight bearing exercises. She will continue zometa as noted above.    3. Follow-up.  She will follow-up with [facility] 4 weeks with a repeat CBC, CMET, Magnesium.  Repeat labs in one week to assess trend given recent decrease while on revlimide. Zometa next visit.   All questions were answered. The patient knows to call the clinic with any problems, questions or concerns. We can certainly see the patient much sooner if necessary.  I spent 15 minutes counseling the patient face to face. The total time spent in the appointment was 25 minutes.    DConcha Norway MD 10/29/2013 12:09 PM

## 2013-10-29 NOTE — Patient Instructions (Signed)
Cold Sore  A cold sore (fever blister) is a skin infection caused by the herpes simplex virus (HSV-1). HSV-1 is closely related to the virus that causes gential herpes (HSV-2), but they are not the same even though both viruses can cause oral and genital infections. Cold sores are small, fluid-filled sores inside of the mouth or on the lips, gums, nose, chin, cheeks, or fingers.   The herpes simplex virus can be easily passed (contagious) to other people through close personal contact, such as kissing or sharing personal items. The virus can also spread to other parts of the body, such as the eyes or genitals. Cold sores are contagious until the sores crust over completely. They often heal within 2 weeks.   Once a person is infected, the herpes simplex virus remains permanently in the body. Therefore, there is no cure for cold sores, and they often recur when a person is tired, stressed, sick, or gets too much sun. Additional factors that can cause a recurrence include hormone changes in menstruation or pregnancy, certain drugs, and cold weather.   CAUSES   Cold sores are caused by the herpes simplex virus. The virus is spread from person to person through close contact, such as through kissing, touching the affected area, or sharing personal items such as lip balm, razors, or eating utensils.   SYMPTOMS   The first infection may not cause symptoms. If symptoms develop, the symptoms often go through different stages. Here is how a cold sore develops:   · Tingling, itching, or burning is felt 1 2 days before the outbreak.    · Fluid-filled blisters appear on the lips, inside the mouth, nose, or on the cheeks.    · The blisters start to ooze clear fluid.    · The blisters dry up and a yellow crust appears in its place.    · The crust falls off.    Symptoms depend on whether it is the initial outbreak or a recurrence. Some other symptoms with the first outbreak may include:   · Fever.    · Sore throat.    · Headache.     · Muscle aches.    · Swollen neck glands.    DIAGNOSIS   A diagnosis is often made based on your symptoms and looking at the sores. Sometimes, a sore may be swabbed and then examined in the lab to make a final diagnosis. If the sores are not present, blood tests can find the herpes simplex virus.   TREATMENT   There is no cure for cold sores and no vaccine for the herpes simplex virus. Within 2 weeks, most cold sores go away on their own without treatment. Medicines cannot make the infection go away, but medicine can help relieve some of the pain associated with the sores, can work to stop the virus from multiplying, and can also shorten healing time. Medicine may be in the form of creams, gels, pills, or a shot.   HOME CARE INSTRUCTIONS   · Only take over-the-counter or prescription medicines for pain, discomfort, or fever as directed by your caregiver. Do not use aspirin.    · Use a cotton-tip swab to apply creams or gels to your sores.    · Do not touch the sores or pick the scabs. Wash your hands often. Do not touch your eyes without washing your hands first.    · Avoid kissing, oral sex, and sharing personal items until sores heal.    · Apply an ice pack on your sores for 10 15 minutes to ease any   discomfort.    · Avoid hot, cold, or salty foods because they may hurt your mouth. Eat a soft, bland diet to avoid irritating the sores. Use a straw to drink if you have pain when drinking out of a glass.    · Keep sores clean and dry to prevent an infection of other tissues.    · Avoid the sun and limit stress if these things trigger outbreaks. If sun causes cold sores, apply sunscreen on the lips before being out in the sun.    SEEK MEDICAL CARE IF:   · You have a fever or persistent symptoms for more than 2 3 days.    · You have a fever and your symptoms suddenly get worse.    · You have pus, not clear fluid, coming from the sores.    · You have redness that is spreading.    · You have pain or irritation in your  eye.    · You get sores on your genitals.    · Your sores do not heal within 2 weeks.    · You have a weakened immune system.    · You have frequent recurrences of cold sores.    MAKE SURE YOU:   · Understand these instructions.  · Will watch your condition.  · Will get help right away if you are not doing well or get worse.  Document Released: 06/28/2000 Document Revised: 03/25/2012 Document Reviewed: 11/13/2011  ExitCare® Patient Information ©2014 [employer].

## 2013-11-01 ENCOUNTER — Telehealth: Payer: Self-pay | Admitting: *Deleted

## 2013-11-01 LAB — IGG, IGA, IGM
IgA: 77 mg/dL (ref 69–380)
IgG (Immunoglobin G), Serum: 1130 mg/dL (ref 690–1700)
IgM, Serum: 44 mg/dL — ABNORMAL LOW (ref 52–322)

## 2013-11-01 LAB — KAPPA/LAMBDA LIGHT CHAINS
Kappa free light chain: 2.97 mg/dL — ABNORMAL HIGH (ref 0.33–1.94)
Kappa:Lambda Ratio: 1.42 (ref 0.26–1.65)
Lambda Free Lght Chn: 2.09 mg/dL (ref 0.57–2.63)

## 2013-11-01 NOTE — Telephone Encounter (Signed)
Per staff message and POF I have scheduled appts.  JMW  

## 2013-11-04 ENCOUNTER — Telehealth: Payer: Self-pay | Admitting: Internal Medicine

## 2013-11-04 NOTE — Telephone Encounter (Signed)
, °

## 2013-11-08 ENCOUNTER — Other Ambulatory Visit: Payer: Self-pay | Admitting: Medical Oncology

## 2013-11-08 MED ORDER — LENALIDOMIDE 5 MG PO CAPS: ORAL_CAPSULE | ORAL | Status: DC

## 2013-11-09 ENCOUNTER — Telehealth: Payer: Self-pay | Admitting: *Deleted

## 2013-11-09 NOTE — Telephone Encounter (Signed)
[facility] faxed [facility] RX clarification for Revlimid.  Called [phone], option 1.  Verified MD orders for Revlimid 5 mg every other day for pharmacist.

## 2013-11-11 ENCOUNTER — Other Ambulatory Visit: Payer: Self-pay | Admitting: Medical Oncology

## 2013-11-11 ENCOUNTER — Encounter: Payer: Self-pay | Admitting: Medical Oncology

## 2013-11-11 MED ORDER — LENALIDOMIDE 5 MG PO CAPS: ORAL_CAPSULE | ORAL | Status: DC

## 2013-11-11 NOTE — Telephone Encounter (Signed)
Printed Revlimid requests

## 2013-11-11 NOTE — Telephone Encounter (Signed)
Pt called and states that she will need 7 more revlimid.When she returned from her trip she was to resume taking 5 mg daily x 21 days then rest x 7. I called [facility] and faxed a new prescription to [facility].

## 2013-11-20 ENCOUNTER — Ambulatory Visit (INDEPENDENT_AMBULATORY_CARE_PROVIDER_SITE_OTHER): Payer: Medicare Other | Admitting: Family Medicine

## 2013-11-20 VITALS — BP 128/78 | HR 81 | Temp 98.7°F | Ht 66.5 in | Wt 126.0 lb

## 2013-11-20 DIAGNOSIS — S81009A Unspecified open wound, unspecified knee, initial encounter: Secondary | ICD-10-CM

## 2013-11-20 DIAGNOSIS — Z23 Encounter for immunization: Secondary | ICD-10-CM

## 2013-11-20 DIAGNOSIS — M79609 Pain in unspecified limb: Secondary | ICD-10-CM

## 2013-11-20 DIAGNOSIS — S91009A Unspecified open wound, unspecified ankle, initial encounter: Secondary | ICD-10-CM

## 2013-11-20 DIAGNOSIS — S81811A Laceration without foreign body, right lower leg, initial encounter: Secondary | ICD-10-CM

## 2013-11-20 DIAGNOSIS — S81809A Unspecified open wound, unspecified lower leg, initial encounter: Secondary | ICD-10-CM

## 2013-11-20 NOTE — Progress Notes (Signed)
Subjective:   This chart was scribed for [name], MD, by Neta Ehlers, ED Scribe. This patient was seen at 5:07 PM.    Patient ID: Shannon Rush, female    DOB: [birth year], 73 y.o.   MRN: [id number] Chief Complaint  Patient presents with  . cut on right leg    HPI  HPI Comments: LUANNA Rush is a 73 y.o. female who presents to [facility] complaining of a laceration to her right shin which occurred PTA when she injured her leg on her bike's pedal. The bleeding is controlled. She denies numbness increased from the baseline. No difficulty moving any joint or walking.  Had an immunization review sev mos ago prior to overseas travel so knows she was UTD on her tetanus at that time (as she did not receive) but she does not remember when her last tetanus was. Ok to repeat it today.  She has a h/o multiple myeloma and is taking medication as prescribed.   She has a h/o antibiotic reaction including rashes and abdominal pain.   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  Past Medical History  Diagnosis Date  . Multiple myeloma 07/01/2012  . Unspecified deficiency anemia   . OSTEOPOROSIS 06/11/2010    Multiple compression fractures; and spontaneous fracture of sternum Qualifier: Diagnosis of  By: [name], [name]     . 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 Cord compression 07/13/12    MRI- diffuse myeloma involvement of T-L spine  . History of radiation therapy 07/13/12-07/27/12    spinal cord compression T3-T10,left scapula    Current Outpatient Prescriptions on File Prior to Visit  Medication Sig Dispense Refill  . acyclovir (ZOVIRAX) 400 MG tablet Take 1 tablet (400 mg total) by mouth daily. Take 400 mg by mouth daily.  30 tablet  2  . calcium carbonate (OS-CAL) 600 MG TABS tablet Take 600 mg by mouth daily with breakfast.       . HYDROcodone-acetaminophen (NORCO) 5-325 MG per tablet Take 1 tablet by mouth every 6 (six) hours as needed  for pain.  90 tablet  0  . hydrocortisone cream 1 % Apply topically as needed.      Marland Kitchen lenalidomide (REVLIMID) 5 MG capsule Take one capsule daily for 21 days then rest 7 days then repeat.  21 capsule  0  . morphine (MS CONTIN) 15 MG 12 hr tablet Take by mouth. Take 15 mg by mouth every 12 (twelve) hours. Using on as needed basis.      Marland Kitchen morphine (MSIR) 15 MG tablet Take 15 mg by mouth every 6 (six) hours as needed for severe pain.      Marland Kitchen ondansetron (ZOFRAN) 4 MG tablet Take 4 mg by mouth every 8 (eight) hours as needed for nausea or vomiting.       No current facility-administered medications on file prior to visit.    Allergies  Allergen Reactions  . Zithromax [Azithromycin]     Full body rash  . Ciprofloxacin Rash    Throwing up and stomach pain     Review of Systems  Constitutional: Negative for fever, chills, diaphoresis and activity change.  Cardiovascular: Positive for leg swelling.  Musculoskeletal: Positive for myalgias. Negative for gait problem and joint swelling.  Skin: Positive for wound. Negative for color change, pallor and rash.  Neurological: Positive for numbness (Baseline). Negative for weakness.  Psychiatric/Behavioral: The patient is not  nervous/anxious.        Objective:   Physical Exam  Nursing note and vitals reviewed. Constitutional: She is oriented to person, place, and time. She appears well-developed and well-nourished. No distress.  HENT:  Head: Normocephalic and atraumatic.  Eyes: EOM are normal.  Neck: Neck supple. No tracheal deviation present.  Cardiovascular: Normal rate.   Pulses:      Dorsalis pedis pulses are 2+ on the right side, and 2+ on the left side.       Posterior tibial pulses are 2+ on the right side, and 2+ on the left side.  1 nonpitting edema bilaterally.  Pulmonary/Chest: Effort normal. No respiratory distress.  Musculoskeletal: Normal range of motion. She exhibits edema.  Neurological: She is alert and oriented to person,  place, and time.  Skin: Skin is warm and dry.     Approximately 5 cm full thickness linear wound hemastatic with subcutaneous fat visible. Laceration to muscle sheath but sheath was not incised at all. Normal ROM of ankle and toes.    Psychiatric: She has a normal mood and affect. Her behavior is normal.   BP 128/78  Pulse 81  Temp(Src) 98.7 F (37.1 C) (Oral)  Ht 5' 6.5" (1.689 m)  Wt 126 lb (57.153 kg)  BMI 20.03 kg/m2  SpO2 96%     Assessment & Plan:   Pt will have sutures applied. Also pt will be given a tetanus booster.   Laceration of leg not thigh, right  No orders of the defined types were placed in this encounter.    I personally performed the services described in this documentation, which was scribed in my presence. The recorded information has been reviewed and considered, and addended by me as needed.  Delman Cheadle, MD MPH

## 2013-11-20 NOTE — Progress Notes (Signed)
Patient ID: FLYNN LININGER MRN: [id number], DOB: July 14, 1941, 73 y.o. Date of Encounter: 11/20/2013, 6:26 PM   PROCEDURE NOTE: Verbal consent obtained. Sterile technique employed. Numbing: Anesthesia obtained with 2% lidocaine with epinephrine.   Cleansed with soap and water. Irrigated.  Wound explored, no deep structures involved, no foreign bodies.   Tendon sheath visible but intact.  Wound repaired with # 13 HM sutures and #4 SI sutures.  Hemostasis obtained. Wound cleansed and dressed.  Wound care instructions including precautions covered with patient. Handout given.  Anticipate suture removal in 12-14 days  Georgiann Mccoy, PA-C 11/20/2013 6:26 PM

## 2013-11-23 ENCOUNTER — Telehealth: Payer: Self-pay | Admitting: Medical Oncology

## 2013-11-23 NOTE — Telephone Encounter (Signed)
I called pt and left a message regarding her revlimid. We received a call from [place] stating that pt did not need the extra 7 capsules we ordered. Pt was out of the country and was taking revlmid 5 mg every other day and prescription was sent for 14 capsules. When she returned she was to restart it for 21 days on and 7 days off. We had prescribed 14 capsules and then had to get an new authorization for 7 more capsules. When the company called the pt she told them she had found some revlimid and did not need the 7. Now [facility] is asking [place] to call [employer] to get an ok to dispense 21 days instead of 7. The authorization number is still good. I tried to contact the pt to verify this but was only able to leave a message. I asked her to call our office so we can discuss.

## 2013-11-26 ENCOUNTER — Other Ambulatory Visit (HOSPITAL_BASED_OUTPATIENT_CLINIC_OR_DEPARTMENT_OTHER): Payer: Medicare Other

## 2013-11-26 ENCOUNTER — Ambulatory Visit (HOSPITAL_BASED_OUTPATIENT_CLINIC_OR_DEPARTMENT_OTHER): Payer: Medicare Other | Admitting: Internal Medicine

## 2013-11-26 ENCOUNTER — Ambulatory Visit (HOSPITAL_BASED_OUTPATIENT_CLINIC_OR_DEPARTMENT_OTHER): Payer: Medicare Other

## 2013-11-26 VITALS — BP 136/73 | HR 72 | Temp 98.1°F | Resp 17 | Ht 66.5 in | Wt 124.0 lb

## 2013-11-26 DIAGNOSIS — C9000 Multiple myeloma not having achieved remission: Secondary | ICD-10-CM

## 2013-11-26 DIAGNOSIS — IMO0002 Reserved for concepts with insufficient information to code with codable children: Secondary | ICD-10-CM

## 2013-11-26 DIAGNOSIS — M810 Age-related osteoporosis without current pathological fracture: Secondary | ICD-10-CM

## 2013-11-26 DIAGNOSIS — Z9481 Bone marrow transplant status: Secondary | ICD-10-CM

## 2013-11-26 LAB — COMPREHENSIVE METABOLIC PANEL (CC13)
ALT: 18 U/L (ref 0–55)
AST: 21 U/L (ref 5–34)
Albumin: 3.8 g/dL (ref 3.5–5.0)
Alkaline Phosphatase: 71 U/L (ref 40–150)
Anion Gap: 12 mEq/L — ABNORMAL HIGH (ref 3–11)
BUN: 14.8 mg/dL (ref 7.0–26.0)
CO2: 21 meq/L — AB (ref 22–29)
Calcium: 9.7 mg/dL (ref 8.4–10.4)
Chloride: 108 mEq/L (ref 98–109)
Creatinine: 0.8 mg/dL (ref 0.6–1.1)
Glucose: 88 mg/dl (ref 70–140)
POTASSIUM: 4.2 meq/L (ref 3.5–5.1)
SODIUM: 140 meq/L (ref 136–145)
Total Bilirubin: 0.71 mg/dL (ref 0.20–1.20)
Total Protein: 7.2 g/dL (ref 6.4–8.3)

## 2013-11-26 LAB — CBC WITH DIFFERENTIAL/PLATELET
BASO%: 4.2 % — ABNORMAL HIGH (ref 0.0–2.0)
Basophils Absolute: 0.1 10*3/uL (ref 0.0–0.1)
EOS ABS: 0.1 10*3/uL (ref 0.0–0.5)
EOS%: 4.2 % (ref 0.0–7.0)
HCT: 35.7 % (ref 34.8–46.6)
HEMOGLOBIN: 11.9 g/dL (ref 11.6–15.9)
LYMPH%: 20.3 % (ref 14.0–49.7)
MCH: 35 pg — ABNORMAL HIGH (ref 25.1–34.0)
MCHC: 33.3 g/dL (ref 31.5–36.0)
MCV: 105 fL — AB (ref 79.5–101.0)
MONO#: 0.3 10*3/uL (ref 0.1–0.9)
MONO%: 14.4 % — AB (ref 0.0–14.0)
NEUT#: 1.3 10*3/uL — ABNORMAL LOW (ref 1.5–6.5)
NEUT%: 56.9 % (ref 38.4–76.8)
PLATELETS: 165 10*3/uL (ref 145–400)
RBC: 3.4 10*6/uL — AB (ref 3.70–5.45)
RDW: 15.3 % — AB (ref 11.2–14.5)
WBC: 2.4 10*3/uL — AB (ref 3.9–10.3)
lymph#: 0.5 10*3/uL — ABNORMAL LOW (ref 0.9–3.3)

## 2013-11-26 MED ORDER — ZOLEDRONIC ACID 4 MG/5ML IV CONC
3.5000 mg | Freq: Once | INTRAVENOUS | Status: AC
  Administered 2013-11-26: 3.5 mg via INTRAVENOUS
  Filled 2013-11-26: qty 4.38

## 2013-11-26 MED ORDER — SODIUM CHLORIDE 0.9 % IV SOLN
Freq: Once | INTRAVENOUS | Status: AC
Start: 2013-11-26 — End: 2013-11-26
  Administered 2013-11-26: 12:00:00 via INTRAVENOUS

## 2013-11-26 NOTE — Progress Notes (Signed)
[place], [place] [address]  DIAGNOSIS: Multiple myeloma - Plan: CBC with Differential, Lactate dehydrogenase (LDH) - [facility], Basic metabolic panel (Bmet) - [facility], CBC with Differential, Comprehensive metabolic panel (Cmet) - [facility], SPEP & IFE with QIG, Kappa/lambda light chains  Chief Complaint  Patient presents with  . Follow-up    CURRENT TREATMENT:  Zometa 3.5 mg monthly;  Revlimide 5 mg daily for 21 days then off for 7 days for maintenance for at least 2 years started on 09/02/2013.  Changed to 14 days then off for 7 days on 11/26/2013 based on her counts.     Multiple myeloma   05/26/2012 Initial Diagnosis Presenting IgG was 4,040 mg/dL on 06/05/2012 (IgA 35; IgM 34); kappa free light chain 34.4 mg/dL, lambda 0.00, kappa:lambda ratio of 34.75; SPEP with M-spike of 2.60.   06/30/2012 Imaging Numerous lytic lesions throughout the calvarium.  Lytic lesion within the left lateral scapula.  Findings most compatible with metastases or myeloma. Multiple mid and lower thoracic compression fractures.  Slight compression through the endplates at L3.   04/30/5101 Bone Marrow Biopsy Bone marrow biopsy showed 49% plasma cell. Normal classical cytogenetics; however, myeloma FISH panel showed 13q- (intermediate risk)       07/14/2012 - 07/27/2012 Radiation Therapy Palliative radiation 20 Gy over 10 fractions between to thoracic spine cord compression and symptomatic left scapula lytic lesion.   08/10/2012 -  Chemotherapy Started SQ Velcade once weekly, 3 weeks on, 1 weeks off; daily Revlimid d1-21, 7 days off; and Dexamethasone 21m PO weekly.    02/12/2013 [facility]    06/14/2013 Tumor Marker IgG  1830,  Kappa:lamba ratio, 1.69 (baseline was 34.75 or   M-spike 0.28 (baseline of 2.6 or  89.3% of baseline).     06/25/2013 -  Chemotherapy Started zometa 3.5 mg monthly     09/01/2013 -  Chemotherapy Maintenance therapy with revlimid 573mdaily for 14 days and then off for 7 (decreased from 21 days on and 7 days off based on neutropenia).    INTERVAL HISTORY: Shannon TAGLIAFERRI73.o. female with a past medical history of MM as detailed above, s/p SCT is here for follow-up. She remains on revlimid 5 mg daily. She is now on off days since Wednesday.  She had an accident with her right leg with a deep abrasion.  She she given a tetanus shot and required 17 stiches.  She denies any infections. She denies any symptoms of anemia outside of fatigue.  She is still riding her bike 10 1/2 miles this past Wednesday.  She lost 5 lbs since her last visit.  She reports a decreased appetite.     MEDICAL HISTORY: Past Medical History  Diagnosis Date  . Multiple myeloma 07/01/2012  . Unspecified deficiency anemia   . OSTEOPOROSIS 06/11/2010    Multiple compression fractures; and spontaneous fracture of sternum Qualifier: Diagnosis of  By: SaZebedee IbaP, SuManuela Schwartz   . 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 Cord compression 07/13/12    MRI- diffuse myeloma involvement of T-L spine  . History of radiation therapy 07/13/12-07/27/12    spinal cord compression T3-T10,left scapula    INTERIM HISTORY: has Anemia, macrocytic; DJD, UNSPECIFIED; OSTEOPOROSIS; KYPHOSIS; LUMBOSACRAL STRAIN; Sciatica of left side; Abdominal pain, acute, right upper quadrant; Thoracic back pain; Multiple myeloma; Cord compression; Abnormal echocardiogram; Swelling, mass, or  lump in chest; Abnormal electrocardiogram; History of peripheral stem cell transplant; and Unspecified vitamin D deficiency on her problem list.    ALLERGIES:  is allergic to zithromax and ciprofloxacin.  MEDICATIONS: has a current medication list which includes the following prescription(s): acyclovir, calcium carbonate, hydrocodone-acetaminophen, hydrocortisone cream, lenalidomide, morphine, morphine, and ondansetron.  SURGICAL HISTORY:   Past Surgical History  Procedure Laterality Date  . Appendectomy    . Tubal ligation    . Cesarean section      x2   . Hip surgery  2009    left  . Elbow surgery    . Colonoscopy  2007    neg with Dr. [name]    REVIEW OF SYSTEMS:   Constitutional: Denies abnormal weight loss Eyes: Denies blurriness of vision Ears, nose, mouth, throat, and face: Denies mucositis or sore throat Respiratory: Denies cough, dyspnea or wheezes Cardiovascular: Denies palpitation, chest discomfort or lower extremity swelling Gastrointestinal:  Denies nausea, heartburn or change in bowel habits Skin: Denies abnormal skin rashes Lymphatics: Denies new lymphadenopathy or easy bruising Neurological:Denies numbness, tingling or new weaknesses Behavioral/Psych: Mood is stable, no new changes  All other systems were reviewed with the patient and are negative.  PHYSICAL EXAMINATION: ECOG PERFORMANCE STATUS: 0 - Asymptomatic  Blood pressure 136/73, pulse 72, temperature 98.1 F (36.7 C), temperature source Oral, resp. rate 17, height 5' 6.5" (1.689 m), weight 124 lb (56.246 kg), SpO2 98.00%.  GENERAL:alert, no distress and comfortable; easily mobile to exam table; kyphosis, moderate.  SKIN: skin color, texture, turgor are normal, no rashes or significant lesions; Abrasion bounded by 17 stiches approximated without signs of infection on the right lower extremity.  EYES: normal, Conjunctiva are pink and non-injected, sclera clear OROPHARYNX:no exudate, no erythema and lips, buccal mucosa, and tongue normal  NECK: supple, thyroid normal size, non-tender, without nodularity; nodules cervical bilaterally.  LYMPH:  no palpable lymphadenopathy in the cervical, axillary or supraclavicular LUNGS: clear to auscultation with normal breathing effort, no wheezes or rhonchi HEART: regular rate & rhythm and no murmurs and no lower extremity edema ABDOMEN:abdomen soft, non-tender and normal bowel sounds Musculoskeletal:no  cyanosis of digits and no clubbing  NEURO: alert & oriented x 3 with fluent speech, no focal motor/sensory deficits  Labs:  Lab Results  Component Value Date   WBC 2.4* 11/26/2013   HGB 11.9 11/26/2013   HCT 35.7 11/26/2013   MCV 105.0* 11/26/2013   PLT 165 11/26/2013   NEUTROABS 1.3* 11/26/2013      Chemistry      Component Value Date/Time   NA 140 11/26/2013 1031   NA 133* 05/08/2012 0922   K 4.2 11/26/2013 1031   K 4.2 05/08/2012 0922   CL 106 11/26/2012 0936   CL 100 05/08/2012 0922   CO2 21* 11/26/2013 1031   CO2 27 05/08/2012 0922   BUN 14.8 11/26/2013 1031   BUN 20 05/08/2012 0922   CREATININE 0.8 11/26/2013 1031   CREATININE 0.67 07/13/2012 1310   CREATININE 0.88 05/08/2012 0922   CREATININE 0.55 04/01/2011 0250      Component Value Date/Time   CALCIUM 9.7 11/26/2013 1031   CALCIUM 9.4 05/08/2012 0922   ALKPHOS 71 11/26/2013 1031   ALKPHOS 77 05/08/2012 0922   AST 21 11/26/2013 1031   AST 21 05/08/2012 0922   ALT 18 11/26/2013 1031   ALT 15 05/08/2012 0922   BILITOT 0.71 11/26/2013 1031   BILITOT 0.6 05/08/2012 0922     CBC:  Recent Labs Lab 11/26/13  1031  WBC 2.4*  NEUTROABS 1.3*  HGB 11.9  HCT 35.7  MCV 105.0*  PLT 165   Studies:  No results found.   RADIOGRAPHIC STUDIES: METASTATIC BONE SURVEY  COMPARISON: DG BONE SURVEY MET dated 06/30/2012  FINDINGS:  Multiple lytic lesions are noted throughout the skull. These are  stable in appearance and consistent with the patient's history of  multiple myeloma. Progressive mid and lower thoracic spine  compression fractures are noted. No prominent retropulsed fragments  noted. Persistent left scapular lesion is present. Left posterior  rib lytic lesions are present consistent with myeloma. Lytic lesion  of the distal left scapula. Deformity of the left clavicle is noted.  Focal lesion with associated healing fracture could present in this  fashion.  IMPRESSION:  1. Persistent unchanged multiple skull lesions.   2. Persistent left scapular lesion.  3. Lytic lesion in the distal right clavicle. Deformity of the left  clavicle consistent with focal lesion with associated fracture and  healing.  4. Progressive thoracic spine compression fractures. No retropulsed  fragments noted.  5. Multiple lytic left posterior rib lesions.  These findings are all consistent with the patient's history of  multiple myeloma  ASSESSMENT: Shannon Rush 73 y.o. female with a history of Multiple myeloma - Plan: CBC with Differential, Lactate dehydrogenase (LDH) - [facility], Basic metabolic panel (Bmet) - [facility], CBC with Differential, Comprehensive metabolic panel (Cmet) - [facility], SPEP & IFE with QIG, Kappa/lambda light chains   PLAN:  1. IgG kappa Multiple myeloma s/p Auto SCT, intermediate risk.  --Today's labs demonstrate a decline in her WBCs and Hbg likely secondary to revlimid 5 mg being daily for 21 days and off 7 days.    We will follow CBC monthly and change to 14 days and off for 7 days.    - Acyclovir 431m PO daily to prevent risk of viral reactivation for one year post transplant.  -- She started revlimid 5 mg daily for maintenance on 09/02/2013.  -- We restarted zometa 3.5 mg q 4 weeks on 06/25/2013. She will receive an infusion today. Plan for one year (monthly) then every 3 months.  --Now 9 months from SCT. --Skeletal survey as noted above.   2. Osteoporosis.  -- [facility]. She will continue calcium, vitamin D and weight bearing exercises. She will continue zometa as noted above.   3. R LE abrasion. --She will have stiches removed in next few days.  She is without signs of infection presently.  She received the tetanus shot.    4. Follow-up.  She will follow-up with uKoreain 8 weeks with a repeat CBC, CMET, Magnesium.  Repeat labs in 4 weeks to assess trend given recent decrease while on revlimide. Zometa next visit.   All questions were  answered. The patient knows to call the clinic with any problems, questions or concerns. We can certainly see the patient much sooner if necessary.  I spent 15 minutes counseling the patient face to face. The total time spent in the appointment was 25 minutes.    DConcha Norway MD 11/26/2013 12:57 PM

## 2013-11-26 NOTE — Patient Instructions (Signed)

## 2013-11-28 ENCOUNTER — Telehealth: Payer: Self-pay | Admitting: Internal Medicine

## 2013-11-28 NOTE — Telephone Encounter (Signed)
s.w. pt and advised on June and July appt...pt ok and aware °

## 2013-11-29 LAB — IGG, IGA, IGM
IGA: 104 mg/dL (ref 69–380)
IGG (IMMUNOGLOBIN G), SERUM: 1350 mg/dL (ref 690–1700)
IgM, Serum: 42 mg/dL — ABNORMAL LOW (ref 52–322)

## 2013-11-29 LAB — KAPPA/LAMBDA LIGHT CHAINS
Kappa free light chain: 2 mg/dL — ABNORMAL HIGH (ref 0.33–1.94)
Kappa:Lambda Ratio: 0.8 (ref 0.26–1.65)
Lambda Free Lght Chn: 2.5 mg/dL (ref 0.57–2.63)

## 2013-12-02 ENCOUNTER — Telehealth: Payer: Self-pay

## 2013-12-02 ENCOUNTER — Ambulatory Visit (INDEPENDENT_AMBULATORY_CARE_PROVIDER_SITE_OTHER): Payer: Medicare Other | Admitting: Physician Assistant

## 2013-12-02 VITALS — BP 120/84 | HR 104 | Temp 97.9°F | Resp 16 | Wt 126.0 lb

## 2013-12-02 DIAGNOSIS — Z23 Encounter for immunization: Secondary | ICD-10-CM

## 2013-12-02 DIAGNOSIS — S81009A Unspecified open wound, unspecified knee, initial encounter: Secondary | ICD-10-CM

## 2013-12-02 DIAGNOSIS — S91009A Unspecified open wound, unspecified ankle, initial encounter: Secondary | ICD-10-CM

## 2013-12-02 DIAGNOSIS — S81809A Unspecified open wound, unspecified lower leg, initial encounter: Secondary | ICD-10-CM

## 2013-12-02 DIAGNOSIS — M79609 Pain in unspecified limb: Secondary | ICD-10-CM

## 2013-12-02 MED ORDER — DOXYCYCLINE HYCLATE 100 MG PO CAPS: 100.0000 mg | ORAL_CAPSULE | Freq: Two times a day (BID) | ORAL | Status: DC

## 2013-12-02 NOTE — Progress Notes (Signed)
   Subjective:    Patient ID: Shannon Rush, female    DOB: 10-05-1940, 72 y.o.   MRN: [id number]  Suture / Staple Removal   73 year old female presents today for suture removal. DOI 11/20/13.  Doing well. Does reports some erythema surrounding the wound but has not noticed any purulent drainage, warmth, or increasing pain.   No other concerns or questions today.     Review of Systems  Constitutional: Negative for fever and chills.  Gastrointestinal: Negative for nausea and vomiting.  Skin: Positive for color change and wound.       Objective:   Physical Exam  Constitutional: She is oriented to person, place, and time. She appears well-developed and well-nourished.  HENT:  Head: Normocephalic and atraumatic.  Right Ear: External ear normal.  Left Ear: External ear normal.  Eyes: Conjunctivae are normal.  Neck: Normal range of motion.  Cardiovascular: Normal rate.   Pulmonary/Chest: Effort normal.  Neurological: She is alert and oriented to person, place, and time.  Skin:     Psychiatric: She has a normal mood and affect. Her behavior is normal. Judgment and thought content normal.     Suture removed without difficulty. The most distal suture was imbedded in the wound. Patient tolerated well.      Assessment & Plan:  Wound, open, leg  Sutures removed. Distal suture left in place. Knot clipped. Patient to do warm compresses and suture will likely fall off with scab.   Discussed low liklihood of wound infection. She is going out of town tomorrow. Will sen rx for doxycycline 100 mg bid x 10 for her to fill if erythema worsens or if she develops purulent drainage or increasing pain, warmth.  RTC precautions discussed.

## 2013-12-02 NOTE — Telephone Encounter (Signed)
[facility] [address] is in patients chart as primary pharmacy.

## 2013-12-02 NOTE — Telephone Encounter (Signed)
Patient was seen at 102 today. Patient came up to [employer] desk and said to please send the antibiotic she and the doctor spoke [facility] [address] and [address]. Thank you.

## 2013-12-13 ENCOUNTER — Telehealth: Payer: Self-pay

## 2013-12-13 ENCOUNTER — Telehealth: Payer: Self-pay | Admitting: *Deleted

## 2013-12-13 NOTE — Telephone Encounter (Signed)
Patient called and left message to reschedule her appts. I have called her back and left a message to call me.  JMW

## 2013-12-13 NOTE — Telephone Encounter (Signed)
Message requesting r/s of lab and zometa forwarded to [facility].

## 2013-12-14 ENCOUNTER — Telehealth: Payer: Self-pay | Admitting: *Deleted

## 2013-12-14 NOTE — Telephone Encounter (Signed)
Patient called and left a message that she needs to reschedule her appts. I have called and left her a message to call me back to move appts    jmw

## 2013-12-14 NOTE — Telephone Encounter (Signed)
Patient called back and appts moved

## 2013-12-20 ENCOUNTER — Ambulatory Visit (HOSPITAL_BASED_OUTPATIENT_CLINIC_OR_DEPARTMENT_OTHER): Payer: Medicare Other | Admitting: Internal Medicine

## 2013-12-20 ENCOUNTER — Telehealth: Payer: Self-pay | Admitting: Medical Oncology

## 2013-12-20 ENCOUNTER — Other Ambulatory Visit: Payer: Self-pay | Admitting: Medical Oncology

## 2013-12-20 ENCOUNTER — Telehealth: Payer: Self-pay | Admitting: Internal Medicine

## 2013-12-20 ENCOUNTER — Other Ambulatory Visit (HOSPITAL_BASED_OUTPATIENT_CLINIC_OR_DEPARTMENT_OTHER): Payer: Medicare Other

## 2013-12-20 ENCOUNTER — Encounter: Payer: Self-pay | Admitting: Internal Medicine

## 2013-12-20 ENCOUNTER — Ambulatory Visit (HOSPITAL_COMMUNITY)
Admission: RE | Admit: 2013-12-20 | Discharge: 2013-12-20 | Disposition: A | Discharge status: Home / Self Care | Payer: Medicare Other | Source: Ambulatory Visit | Attending: Internal Medicine | Admitting: Internal Medicine

## 2013-12-20 VITALS — BP 129/69 | HR 82 | Temp 98.0°F | Resp 21 | Ht 66.5 in | Wt 122.5 lb

## 2013-12-20 DIAGNOSIS — R0989 Other specified symptoms and signs involving the circulatory and respiratory systems: Secondary | ICD-10-CM

## 2013-12-20 DIAGNOSIS — C9000 Multiple myeloma not having achieved remission: Secondary | ICD-10-CM | POA: Insufficient documentation

## 2013-12-20 DIAGNOSIS — R0609 Other forms of dyspnea: Secondary | ICD-10-CM

## 2013-12-20 DIAGNOSIS — D61818 Other pancytopenia: Secondary | ICD-10-CM

## 2013-12-20 DIAGNOSIS — R0602 Shortness of breath: Secondary | ICD-10-CM | POA: Insufficient documentation

## 2013-12-20 DIAGNOSIS — R0600 Dyspnea, unspecified: Secondary | ICD-10-CM

## 2013-12-20 DIAGNOSIS — J811 Chronic pulmonary edema: Secondary | ICD-10-CM

## 2013-12-20 DIAGNOSIS — M810 Age-related osteoporosis without current pathological fracture: Secondary | ICD-10-CM

## 2013-12-20 DIAGNOSIS — J9819 Other pulmonary collapse: Secondary | ICD-10-CM | POA: Insufficient documentation

## 2013-12-20 LAB — CBC WITH DIFFERENTIAL/PLATELET
BASO%: 0.9 % (ref 0.0–2.0)
BASOS ABS: 0 10*3/uL (ref 0.0–0.1)
EOS ABS: 0 10*3/uL (ref 0.0–0.5)
EOS%: 2.4 % (ref 0.0–7.0)
HCT: 34.3 % — ABNORMAL LOW (ref 34.8–46.6)
HEMOGLOBIN: 11.3 g/dL — AB (ref 11.6–15.9)
LYMPH%: 17.7 % (ref 14.0–49.7)
MCH: 34.6 pg — ABNORMAL HIGH (ref 25.1–34.0)
MCHC: 32.9 g/dL (ref 31.5–36.0)
MCV: 105 fL — AB (ref 79.5–101.0)
MONO#: 0.4 10*3/uL (ref 0.1–0.9)
MONO%: 20.3 % — AB (ref 0.0–14.0)
NEUT%: 58.7 % (ref 38.4–76.8)
NEUTROS ABS: 1 10*3/uL — AB (ref 1.5–6.5)
Platelets: 133 10*3/uL — ABNORMAL LOW (ref 145–400)
RBC: 3.26 10*6/uL — ABNORMAL LOW (ref 3.70–5.45)
RDW: 15.4 % — ABNORMAL HIGH (ref 11.2–14.5)
WBC: 1.8 10*3/uL — ABNORMAL LOW (ref 3.9–10.3)
lymph#: 0.3 10*3/uL — ABNORMAL LOW (ref 0.9–3.3)

## 2013-12-20 LAB — BASIC METABOLIC PANEL (CC13)
ANION GAP: 7 meq/L (ref 3–11)
BUN: 17.1 mg/dL (ref 7.0–26.0)
CALCIUM: 9.3 mg/dL (ref 8.4–10.4)
CO2: 26 mEq/L (ref 22–29)
Chloride: 107 mEq/L (ref 98–109)
Creatinine: 0.8 mg/dL (ref 0.6–1.1)
GLUCOSE: 102 mg/dL (ref 70–140)
Potassium: 3.5 mEq/L (ref 3.5–5.1)
Sodium: 140 mEq/L (ref 136–145)

## 2013-12-20 LAB — LACTATE DEHYDROGENASE (CC13): LDH: 204 U/L (ref 125–245)

## 2013-12-20 MED ORDER — LENALIDOMIDE 2.5 MG PO CAPS: ORAL_CAPSULE | ORAL | Status: DC

## 2013-12-20 MED ORDER — FUROSEMIDE 20 MG PO TABS: 20.0000 mg | ORAL_TABLET | Freq: Every day | ORAL | Status: DC

## 2013-12-20 NOTE — Telephone Encounter (Signed)
Pt called stating that she is hoarse, has a cough and coughing up white/yellowish secretions. She did not take her temperature yesterday but felt she had a fever. She took 3 ibuprofen and felt better. She does not have a fever this am but continues to cough. I spoke with Dr. Juliann Mule and he would like to get a CBC and he an see there this afternoon. I gave pt times for appt and she voiced understanding.

## 2013-12-20 NOTE — Patient Instructions (Signed)
Neutropenia Neutropenia is a condition that occurs when the level of a certain type of white blood cell (neutrophil) in your body becomes lower than normal. Neutrophils are made in the bone marrow and fight infections. These cells protect against bacteria and viruses. The fewer neutrophils you have, and the longer your body remains without them, the greater your risk of getting a severe infection becomes. CAUSES  The cause of neutropenia may be hard to determine. However, it is usually due to 3 main problems:   Decreased production of neutrophils. This may be due to:  Certain medicines such as chemotherapy.  Genetic problems.  Cancer.  Radiation treatments.  Vitamin deficiency.  Some pesticides.  Increased destruction of neutrophils. This may be due to:  Overwhelming infections.  Hemolytic anemia. This is when the body destroys its own blood cells.  Chemotherapy.  Neutrophils moving to areas of the body where they cannot fight infections. This may be due to:  Dialysis procedures.  Conditions where the spleen becomes enlarged. Neutrophils are held in the spleen and are not available to the rest of the body.  Overwhelming infections. The neutrophils are held in the area of the infection and are not available to the rest of the body. SYMPTOMS  There are no specific symptoms of neutropenia. The lack of neutrophils can result in an infection, and an infection can cause various problems. DIAGNOSIS  Diagnosis is made by a blood test. A complete blood count is performed. The normal level of neutrophils in human blood differs with age and race. Infants have lower counts than older children and adults. African Americans have lower counts than Caucasians or Asians. The average adult level is 1500 cells/mm3 of blood. Neutrophil counts are interpreted as follows:  Greater than 1000 cells/mm3 gives normal protection against infection.  500 to 1000 cells/mm3 gives an increased risk for  infection.  200 to 500 cells/mm3 is a greater risk for severe infection.  Lower than 200 cells/mm3 is a marked risk of infection. This may require hospitalization and treatment with antibiotic medicines. TREATMENT  Treatment depends on the underlying cause, severity, and presence of infections or symptoms. It also depends on your health. Your caregiver will discuss the treatment plan with you. Mild cases are often easily treated and have a good outcome. Preventative measures may also be started to limit your risk of infections. Treatment can include:  Taking antibiotics.  Stopping medicines that are known to cause neutropenia.  Correcting nutritional deficiencies by eating green vegetables to supply folic acid and taking vitamin B supplements.  Stopping exposure to pesticides if your neutropenia is related to pesticide exposure.  Taking a blood growth factor called sargramostim, pegfilgrastim, or filgrastim if you are undergoing chemotherapy for cancer. This stimulates white blood cell production.  Removal of the spleen if you have Felty's syndrome and have repeated infections. HOME CARE INSTRUCTIONS   Follow your caregiver's instructions about when you need to have blood work done.  Wash your hands often. Make sure others who come in contact with you also wash their hands.  Wash raw fruits and vegetables before eating them. They can carry bacteria and fungi.  Avoid people with colds or spreadable (contagious) diseases (chickenpox, herpes zoster, influenza).  Avoid large crowds.  Avoid construction areas. The dust can release fungus into the air.  Be cautious around children in daycare or school environments.  Take care of your respiratory system by coughing and deep breathing.  Bathe daily.  Protect your skin from cuts and   burns.  Do not work in the garden or with flowers and plants.  Care for the mouth before and after meals by brushing with a soft toothbrush. If you have  mucositis, do not use mouthwash. Mouthwash contains alcohol and can dry out the mouth even more.  Clean the area between the genitals and the anus (perineal area) after urination and bowel movements. Women need to wipe from front to back.  Use a water soluble lubricant during sexual intercourse and practice good hygiene after. Do not have intercourse if you are severely neutropenic. Check with your caregiver for guidelines.  Exercise daily as tolerated.  Avoid people who were vaccinated with a live vaccine in the past 30 days. You should not receive live vaccines (polio, typhoid).  Do not provide direct care for pets. Avoid animal droppings. Do not clean litter boxes and bird cages.  Do not share food utensils.  Do not use tampons, enemas, or rectal suppositories unless directed by your caregiver.  Use an electric razor to remove hair.  Wash your hands after handling magazines, letters, and newspapers. SEEK IMMEDIATE MEDICAL CARE IF:   You have a fever.  You have chills or start to shake.  You feel nauseous or vomit.  You develop mouth sores.  You develop aches and pains.  You have redness and swelling around open wounds.  Your skin is warm to the touch.  You have pus coming from your wounds.  You develop swollen lymph nodes.  You feel weak or fatigued.  You develop red streaks on the skin. MAKE SURE YOU:  Understand these instructions.  Will watch your condition.  Will get help right away if you are not doing well or get worse. Document Released: 12/21/2001 Document Revised: 09/23/2011 Document Reviewed: 01/18/2011 [facility] Patient Information 2014 [place], [place]. Dextromethorphan; Guaifenesin capsules and ER tablets What is this medicine? DEXTROMETHORPHAN; GUAIFENESIN (dex troe meth OR fan; gwye FEN e sin) is a cough suppressant, expectorant combination. It is used to provide relief from cough. This medicine will not treat an infection. This medicine may be  used for other purposes; ask your health care provider or pharmacist if you have questions. COMMON BRAND NAME(S): Alka-Seltzer Plus Max Cough, Mucus & Congestion, Alka-Seltzer Plus Mucus and Congestion, AllFen DM, Ambi, Amibid DM , Aquabid DM , Aquatab DM, Bidex-A, Bidex-DM, Cofex-DM , Coricidin HBP Chest Congetion and Cough, Duradex Forte, Duradex, Extuss LA , Fenesin DM, G-Bid DM TR, GFN 600/DM 30 , Guaidrine DM, Guaifenex DM, Guia-D , Humibid DM, Iobid DM , Mindal DM , Mucinex DM, Muco-Fen DM, Phlemex, Q-Bid DM, Relacon LAX, Respa-DM, Ru-Tuss-DM, Sudal DM, Touro DM, Tussi-Bid, Z-Cof LA, Z-Cof LAX What should I tell my health care provider before I take this medicine? They need to know if you have any of these conditions: -asthma -chronic bronchitis -emphysema -if you have taken an MAOI like Carbex, Eldepryl, Marplan, Nardil, or Parnate in last 14 days -kidney or liver disease -unable to sit up -an unusual or allergic reaction to dextromethorphan, guaifenesin, other medicines, foods, dyes, bromides, or preservatives -pregnant or trying to get pregnant -breast-feeding How should I use this medicine? Take this medicine by mouth with a full glass of water. Follow the directions on the prescription label. Do not crush or chew. Take your medicine at regular intervals. Do not take your medicine more often than directed. Talk to your pediatrician regarding the use of this medicine in children. While this drug may be prescribed for children as young as 6  years of age for selected conditions, precautions do apply. Overdosage: If you think you have taken too much of this medicine contact a poison control center or emergency room at once. NOTE: This medicine is only for you. Do not share this medicine with others. What if I miss a dose? If you miss a dose, take it as soon as you can. If it is almost time for your next dose, take only that dose. Do not take double or extra doses. What may interact with  this medicine? Do not take this medicine with any of the following medications: -MAOIs like Carbex, Eldepryl, Marplan, Nardil, and Parnate -procarbazine This medicine may also interact with the following medications: -medicines for depression or other mental disturbances -other medicines for colds or allergy This list may not describe all possible interactions. Give your health care provider a list of all the medicines, herbs, non-prescription drugs, or dietary supplements you use. Also tell them if you smoke, drink alcohol, or use illegal drugs. Some items may interact with your medicine. What should I watch for while using this medicine? Tell your doctor if your symptoms do not improve within 5 days or if they get worse. If you have a high fever, skin rash, lasting headache, or sore throat, see your doctor. Drink 6 to 8 glasses of water daily while you are taking this medicine to help loosen mucus. You may get drowsy or dizzy. Do not drive, use machinery, or do anything that needs mental alertness until you know how this medicine affects you. Do not stand or sit up quickly, especially if you are an older patient. This reduces the risk of dizzy or fainting spells. Alcohol may interfere with the effect of this medicine. Avoid alcoholic drinks. What side effects may I notice from receiving this medicine? Side effects that you should report to your doctor or health care professional as soon as possible: -allergic reactions like skin rash, itching or hives, swelling of the face, lips, or tongue -confusion -excitement, nervousness, restlessness, or irritability -slow or troubled breathing Side effects that usually do not require medical attention (report to your doctor or health care professional if they continue or are bothersome): -headache -stomach upset This list may not describe all possible side effects. Call your doctor for medical advice about side effects. You may report side effects to FDA  at 1-800-FDA-1088. Where should I keep my medicine? Keep out of the reach of children. Store at room temperature between 15 and 30 degrees C (59 and 86 degrees F). Protect from light and moisture. Throw away any unused medicine after the expiration date. NOTE: This sheet is a summary. It may not cover all possible information. If you have questions about this medicine, talk to your doctor, pharmacist, or health care provider.  2014, [employer]vier/Gold Standard. (2007-10-22 17:31:08)

## 2013-12-20 NOTE — Telephone Encounter (Signed)
added pt to appt....per pof...per robin pt already aware

## 2013-12-20 NOTE — Telephone Encounter (Signed)
pt sent to [facility] for cxr and given AVS. per desk nurse pt does not need to rtn for lab tomorrow. no other orders.

## 2013-12-21 NOTE — Progress Notes (Signed)
[place], [place] Shannon Rush [id number]  DIAGNOSIS: Dyspnea - Plan: DG Chest 2 View  Pulmonary edema  Multiple myeloma  Chief Complaint  Patient presents with  . Shortness of Breath    CURRENT TREATMENT:  Zometa 3.5 mg monthly;  Revlimide 5 mg daily for 21 days then off for 7 days for maintenance for at least 2 years started on 09/02/2013.  Changed to 14 days then off for 7 days on 11/26/2013 based on her counts.     Multiple myeloma   05/26/2012 Initial Diagnosis Presenting IgG was 4,040 mg/dL on 06/05/2012 (IgA 35; IgM 34); kappa free light chain 34.4 mg/dL, lambda 0.00, kappa:lambda ratio of 34.75; SPEP with M-spike of 2.60.   06/30/2012 Imaging Numerous lytic lesions throughout the calvarium.  Lytic lesion within the left lateral scapula.  Findings most compatible with metastases or myeloma. Multiple mid and lower thoracic compression fractures.  Slight compression through the endplates at L3.   38/04/1750 Bone Marrow Biopsy Bone marrow biopsy showed 49% plasma cell. Normal classical cytogenetics; however, myeloma FISH panel showed 13q- (intermediate risk)       07/14/2012 - 07/27/2012 Radiation Therapy Palliative radiation 20 Gy over 10 fractions between to thoracic spine cord compression and symptomatic left scapula lytic lesion.   08/10/2012 -  Chemotherapy Started SQ Velcade once weekly, 3 weeks on, 1 weeks off; daily Revlimid d1-21, 7 days off; and Dexamethasone 33m PO weekly.    02/12/2013 Bone Marrow Transplant [facility]    06/14/2013 Tumor Marker IgG  1830,  Kappa:lamba ratio, 1.69 (baseline was 34.75 or   M-spike 0.28 (baseline of 2.6 or  89.3% of baseline).     06/25/2013 -  Chemotherapy Started zometa 3.5 mg monthly    09/01/2013 -  Chemotherapy Maintenance therapy with revlimid 548mdaily for 14 days and then off for 7 (decreased from 21 days on and 7 days off based on  neutropenia).    INTERVAL HISTORY: Shannon GARIEPY89.o. female with a past medical history of MM as detailed above, s/p SCT is here for follow-up. She remains on revlimid 5 mg for 14 days on and 7 days off.  She has been off since this past Wednesday.  She complains of worsening productive cough and dyspnea especially on exertion. She denies fevers or exposures to sick contacts.   Her prior right leg accident has healed. She denies any infections. She denies any symptoms of anemia outside of fatigue.       MEDICAL HISTORY: Past Medical History  Diagnosis Date  . Multiple myeloma 07/01/2012  . Unspecified deficiency anemia   . OSTEOPOROSIS 06/11/2010    Multiple compression fractures; and spontaneous fracture of sternum Qualifier: Diagnosis of  By: SaZebedee IbaP, SuManuela Rush   . 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 Cord compression 07/13/12    MRI- diffuse myeloma involvement of T-L spine  . History of radiation therapy 07/13/12-07/27/12    spinal cord compression T3-T10,left scapula    INTERIM HISTORY: has Anemia, macrocytic; DJD, UNSPECIFIED; OSTEOPOROSIS; KYPHOSIS; LUMBOSACRAL STRAIN; Sciatica of left side; Abdominal pain, acute, right upper quadrant; Thoracic back pain; Multiple myeloma; Cord compression; Abnormal echocardiogram; Swelling, mass, or lump in chest; Abnormal electrocardiogram; History of peripheral stem cell transplant; and Unspecified vitamin D deficiency on her problem list.    ALLERGIES:  is allergic to zithromax and ciprofloxacin.  MEDICATIONS: has  a current medication list which includes the following prescription(s): acyclovir, calcium carbonate, hydrocodone-acetaminophen, hydrocortisone cream, morphine, morphine, ondansetron, furosemide, and lenalidomide.  SURGICAL HISTORY:  Past Surgical History  Procedure Laterality Date  . Appendectomy    . Tubal ligation    . Cesarean section      x2   . Hip surgery  2009    left  . Elbow surgery    . Colonoscopy  2007     neg with Dr. [name]    REVIEW OF SYSTEMS:   Constitutional: Denies abnormal weight loss Eyes: Denies blurriness of vision Ears, nose, mouth, throat, and face: Denies mucositis or sore throat Respiratory: Denies cough, dyspnea or wheezes Cardiovascular: Denies palpitation, chest discomfort or lower extremity swelling Gastrointestinal:  Denies nausea, heartburn or change in bowel habits Skin: Denies abnormal skin rashes Lymphatics: Denies new lymphadenopathy or easy bruising Neurological:Denies numbness, tingling or new weaknesses Behavioral/Psych: Mood is stable, no new changes  All other systems were reviewed with the patient and are negative.  PHYSICAL EXAMINATION: ECOG PERFORMANCE STATUS: 0 - Asymptomatic  Blood pressure 129/69, pulse 82, temperature 98 F (36.7 C), temperature source Oral, resp. rate 21, height 5' 6.5" (1.689 m), weight 122 lb 8 oz (55.566 kg), SpO2 99.00%.  GENERAL:alert, no distress and comfortable; easily mobile to exam table; kyphosis, moderate.  SKIN: skin color, texture, turgor are normal, no rashes or significant lesions; Abrasion well healed on RLE.  EYES: normal, Conjunctiva are pink and non-injected, sclera clear OROPHARYNX:no exudate, no erythema and lips, buccal mucosa, and tongue normal  NECK: supple, thyroid normal size, non-tender, without nodularity; nodules cervical bilaterally.  LYMPH:  no palpable lymphadenopathy in the cervical, axillary or supraclavicular LUNGS: crackles at the bases bilaterally with normal breathing effort, no wheezes or rhonchi HEART: regular rate & rhythm and no murmurs and no lower extremity edema ABDOMEN:abdomen soft, non-tender and normal bowel sounds Musculoskeletal:no cyanosis of digits and no clubbing  NEURO: alert & oriented x 3 with fluent speech, no focal motor/sensory deficits  Labs:  Lab Results  Component Value Date   WBC 1.8* 12/20/2013   HGB 11.3* 12/20/2013   HCT 34.3* 12/20/2013   MCV 105.0* 12/20/2013    PLT 133* 12/20/2013   NEUTROABS 1.0* 12/20/2013      Chemistry      Component Value Date/Time   NA 140 12/20/2013 1439   NA 133* 05/08/2012 0922   K 3.5 12/20/2013 1439   K 4.2 05/08/2012 0922   CL 106 11/26/2012 0936   CL 100 05/08/2012 0922   CO2 26 12/20/2013 1439   CO2 27 05/08/2012 0922   BUN 17.1 12/20/2013 1439   BUN 20 05/08/2012 0922   CREATININE 0.8 12/20/2013 1439   CREATININE 0.67 07/13/2012 1310   CREATININE 0.88 05/08/2012 0922   CREATININE 0.55 04/01/2011 0250      Component Value Date/Time   CALCIUM 9.3 12/20/2013 1439   CALCIUM 9.4 05/08/2012 0922   ALKPHOS 71 11/26/2013 1031   ALKPHOS 77 05/08/2012 0922   AST 21 11/26/2013 1031   AST 21 05/08/2012 0922   ALT 18 11/26/2013 1031   ALT 15 05/08/2012 0922   BILITOT 0.71 11/26/2013 1031   BILITOT 0.6 05/08/2012 0922     CBC:  Recent Labs Lab 12/20/13 1439  WBC 1.8*  NEUTROABS 1.0*  HGB 11.3*  HCT 34.3*  MCV 105.0*  PLT 133*   Studies:  Dg Chest 2 View  12/20/2013   CLINICAL DATA:  Dyspnea. Chemotherapy. Multiple myeloma. Short  of breath during chemotherapy.  EXAM: CHEST  2 VIEW  COMPARISON:  Multiple priors, including CT 07/13/2012 and chest radiograph 10/27/2013.  FINDINGS: Compared to the prior exam, there is new basilar opacity which appears to represent a combination of pulmonary edema and atelectasis. There is no definite consolidation. Chronic osseous lesions are little changed compared to prior with thoracic compression fractures and pathologic fracture of the left clavicle. Old left-sided rib fractures are also present. On the lateral view, right middle lobe atelectasis is present which is subsegmental.  IMPRESSION: New bilateral basilar opacities compatible with a combination of Mild pulmonary edema and atelectasis.   Electronically Signed   By: Dereck Ligas M.D.   On: 12/20/2013 16:02     RADIOGRAPHIC STUDIES: METASTATIC BONE SURVEY  COMPARISON: DG BONE SURVEY MET dated 06/30/2012  FINDINGS:  Multiple lytic  lesions are noted throughout the skull. These are  stable in appearance and consistent with the patient's history of  multiple myeloma. Progressive mid and lower thoracic spine  compression fractures are noted. No prominent retropulsed fragments  noted. Persistent left scapular lesion is present. Left posterior  rib lytic lesions are present consistent with myeloma. Lytic lesion  of the distal left scapula. Deformity of the left clavicle is noted.  Focal lesion with associated healing fracture could present in this  fashion.  IMPRESSION:  1. Persistent unchanged multiple skull lesions.  2. Persistent left scapular lesion.  3. Lytic lesion in the distal right clavicle. Deformity of the left  clavicle consistent with focal lesion with associated fracture and  healing.  4. Progressive thoracic spine compression fractures. No retropulsed  fragments noted.  5. Multiple lytic left posterior rib lesions.  These findings are all consistent with the patient's history of  multiple myeloma  ASSESSMENT: Shannon Rush 73 y.o. female with a history of Dyspnea - Plan: DG Chest 2 View  Pulmonary edema  Multiple myeloma   PLAN:  1. Dyspne/Cough. --We ordered a cxr to rule out pneumonia.  It revealed atelectasis and mild pulmonary edema.  We will give her lasix 20 mg daily x 5-7 days to determine if her symptoms improve.    2. Pancytopenia due to chemotherapy. --Despite doing revlimid 2 weeks on and then one week off, her counts remain low and she has had multiple complaints including mouth blisters and now dyspnea.  Thus, we will decrease her dose to 2.5 mg daily for 3 weeks on and then one week off.  Prescription was faxed today.   3. IgG kappa Multiple myeloma s/p Auto SCT, intermediate risk.  --MM markers demonstrate a very good response.     - Acyclovir $RemoveBef'400mg'[id number]$  PO daily to prevent risk of viral reactivation for one year post transplant.  -- She started revlimid 5 mg daily for maintenance on  09/02/2013.   Decreasing to 2.5 daily for 3 weeks on and then one week off on 12/20/2013.   -- We restarted zometa 3.5 mg q 4 weeks on 06/25/2013. She will receive an infusion next week Plan for one year (monthly) then every 3 months.  --Now 9 months from SCT. --Skeletal survey as noted above.   4. Osteoporosis.  -- Patient had DEXA scan performed at [facility] consistent with improvement to osteopenia. She will continue calcium, vitamin D and weight bearing exercises. She will continue zometa as noted above.   5. R LE abrasion, resolved.  --She will have stiches removed in next few days.  She is without signs of infection presently.  She  received the tetanus shot.    6. Follow-up.  She will follow-up with [place] next week for a symptom visit. Repeat labs in 4 weeks to assess trend given recent decrease while on revlimide. Zometa next visit.   All questions were answered. The patient knows to call the clinic with any problems, questions or concerns. We can certainly see the patient much sooner if necessary.  I spent 15 minutes counseling the patient face to face. The total time spent in the appointment was 25 minutes.    [name], MD 12/21/2013 10:03 AM

## 2013-12-22 ENCOUNTER — Other Ambulatory Visit: Payer: Self-pay | Admitting: Internal Medicine

## 2013-12-22 ENCOUNTER — Telehealth: Payer: Self-pay | Admitting: *Deleted

## 2013-12-22 DIAGNOSIS — J189 Pneumonia, unspecified organism: Secondary | ICD-10-CM

## 2013-12-22 MED ORDER — DOXYCYCLINE HYCLATE 100 MG PO TABS: 100.0000 mg | ORAL_TABLET | Freq: Two times a day (BID) | ORAL | Status: DC

## 2013-12-22 NOTE — Telephone Encounter (Signed)
Patient called this am.  She ran of fever of 100.8 last night.  She took ibuprofen.  She is taking her lasix and cough medicine. Called patient back.  Her temp now is 99.6.  She has a productive cough with green-yellow phlegm that "smells bad."  She is more tired.  She does not think she is SOB.  She is able to eat and drink.   Her allergies include zithromax and cipro.  [facility] is [facility]. Will discuss with Dr.Chism. Will prescribe antibiotic for patient.  Called and let her know to expect rx..  [facility]   Let her know to call us if she continues to run fever or does not improve.  She was "impressed" by how quickly we have responded.

## 2013-12-24 ENCOUNTER — Ambulatory Visit: Payer: Medicare Other | Admitting: Physician Assistant

## 2013-12-24 ENCOUNTER — Other Ambulatory Visit: Payer: Medicare Other

## 2013-12-24 ENCOUNTER — Ambulatory Visit: Payer: Medicare Other

## 2013-12-27 NOTE — Telephone Encounter (Signed)
Opened in error

## 2013-12-27 NOTE — Telephone Encounter (Signed)
Informed pt needs labs and chest x-ray.

## 2013-12-28 ENCOUNTER — Other Ambulatory Visit (HOSPITAL_BASED_OUTPATIENT_CLINIC_OR_DEPARTMENT_OTHER): Payer: Medicare Other

## 2013-12-28 ENCOUNTER — Ambulatory Visit (HOSPITAL_BASED_OUTPATIENT_CLINIC_OR_DEPARTMENT_OTHER): Payer: Medicare Other | Admitting: Internal Medicine

## 2013-12-28 ENCOUNTER — Ambulatory Visit (HOSPITAL_BASED_OUTPATIENT_CLINIC_OR_DEPARTMENT_OTHER): Payer: Medicare Other

## 2013-12-28 VITALS — BP 120/79 | HR 83 | Temp 97.7°F | Resp 18 | Ht 66.5 in | Wt 119.4 lb

## 2013-12-28 DIAGNOSIS — C9000 Multiple myeloma not having achieved remission: Secondary | ICD-10-CM

## 2013-12-28 DIAGNOSIS — R059 Cough, unspecified: Secondary | ICD-10-CM

## 2013-12-28 DIAGNOSIS — T451X5A Adverse effect of antineoplastic and immunosuppressive drugs, initial encounter: Secondary | ICD-10-CM

## 2013-12-28 DIAGNOSIS — M810 Age-related osteoporosis without current pathological fracture: Secondary | ICD-10-CM

## 2013-12-28 DIAGNOSIS — D61811 Other drug-induced pancytopenia: Secondary | ICD-10-CM

## 2013-12-28 DIAGNOSIS — R05 Cough: Secondary | ICD-10-CM

## 2013-12-28 LAB — CBC WITH DIFFERENTIAL/PLATELET
BASO%: 1.3 % (ref 0.0–2.0)
Basophils Absolute: 0 10*3/uL (ref 0.0–0.1)
EOS%: 2.4 % (ref 0.0–7.0)
Eosinophils Absolute: 0.1 10*3/uL (ref 0.0–0.5)
HCT: 32.8 % — ABNORMAL LOW (ref 34.8–46.6)
HGB: 10.9 g/dL — ABNORMAL LOW (ref 11.6–15.9)
LYMPH%: 15 % (ref 14.0–49.7)
MCH: 34.2 pg — AB (ref 25.1–34.0)
MCHC: 33.2 g/dL (ref 31.5–36.0)
MCV: 102.8 fL — AB (ref 79.5–101.0)
MONO#: 0.2 10*3/uL (ref 0.1–0.9)
MONO%: 7.3 % (ref 0.0–14.0)
NEUT#: 2.4 10*3/uL (ref 1.5–6.5)
NEUT%: 74 % (ref 38.4–76.8)
Platelets: 196 10*3/uL (ref 145–400)
RBC: 3.19 10*6/uL — AB (ref 3.70–5.45)
RDW: 15 % — ABNORMAL HIGH (ref 11.2–14.5)
WBC: 3.3 10*3/uL — ABNORMAL LOW (ref 3.9–10.3)
lymph#: 0.5 10*3/uL — ABNORMAL LOW (ref 0.9–3.3)

## 2013-12-28 MED ORDER — SODIUM CHLORIDE 0.9 % IV SOLN
3.5000 mg | Freq: Once | INTRAVENOUS | Status: AC
  Administered 2013-12-28: 3.5 mg via INTRAVENOUS
  Filled 2013-12-28: qty 4.38

## 2013-12-28 MED ORDER — SODIUM CHLORIDE 0.9 % IV SOLN
Freq: Once | INTRAVENOUS | Status: AC
  Administered 2013-12-28: 13:00:00 via INTRAVENOUS

## 2013-12-28 NOTE — Patient Instructions (Signed)
Incentive Spirometer An incentive spirometer is a tool that can help keep your lungs clear and active. This tool measures how well you are filling your lungs with each breath. Taking long deep breaths may help reverse or decrease the chance of developing breathing (pulmonary) problems (especially infection) following:  Surgery of the chest or abdomen.  Surgery if you have a history of smoking or a lung problem.  A long period of time when you are unable to move or be active. BEFORE THE PROCEDURE   If the spirometer includes an indictor to show your best effort, your nurse or respiratory therapist will set it to a desired goal.  If possible, sit up straight or lean slightly forward. Try not to slouch.  Hold the incentive spirometer in an upright position. INSTRUCTIONS FOR USE  1. Sit on the edge of your bed if possible, or sit up as far as you can in bed or on a chair. 2. Hold the incentive spirometer in an upright position. 3. Breathe out normally. 4. Place the mouthpiece in your mouth and seal your lips tightly around it. 5. Breathe in slowly and as deeply as possible, raising the piston or the ball toward the top of the column. 6. Hold your breath for 3-5 seconds or for as long as possible. Allow the piston or ball to fall to the bottom of the column. 7. Remove the mouthpiece from your mouth and breathe out normally. 8. Rest for a few seconds and repeat Steps 1 through 7 at least 10 times every 1-2 hours when you are awake. Take your time and take a few normal breaths between deep breaths. 9. The spirometer may include an indicator to show your best effort. Use the indicator as a goal to work toward during each repetition. 10. After each set of 10 deep breaths, practice coughing to be sure your lungs are clear. If you have an incision (the cut made at the time of surgery), support your incision when coughing by placing a pillow or rolled up towels firmly against it. Once you are able to  get out of bed, walk around indoors and cough well. You may stop using the incentive spirometer when instructed by your caregiver.  RISKS AND COMPLICATIONS  Breathing too quickly may cause dizziness. At an extreme, this could cause you to pass out. Take your time so you do not get dizzy or light-headed.  If you are in pain, you may need to take or ask for pain medication before doing incentive spirometry. It is harder to take a deep breath if you are having pain. AFTER USE  Rest and breathe slowly and easily.  It can be helpful to keep track of a log of your progress. Your caregiver can provide you with a simple table to help with this. If you are using the spirometer at home, follow these instructions: [place] IF:   You are having difficultly using the spirometer.  You have trouble using the spirometer as often as instructed.  Your pain medication is not giving enough relief while using the spirometer.  You develop fever of 100.5 F (38.1 C) or higher. SEEK IMMEDIATE MEDICAL CARE IF:   You cough up bloody sputum that had not been present before.  You develop fever of 102 F (38.9 C) or greater.  You develop worsening pain at or near the incision site. MAKE SURE YOU:   Understand these instructions.  Will watch your condition.  Will get help right away  if you are not doing well or get worse. Document Released: 11/11/2006 Document Revised: 09/23/2011 Document Reviewed: 01/12/2007 [facility] Patient Information 2014 [place], [place].

## 2013-12-28 NOTE — Patient Instructions (Signed)
Shannon Rush injection (Hypercalcemia, Oncology) What is this medicine? Shannon Rush (ZOE le dron ik AS id) lowers the amount of calcium loss from bone. It is used to treat too much calcium in your blood from cancer. It is also used to prevent complications of cancer that has spread to the bone. This medicine may be used for other purposes; ask your health care Shannon Rush or pharmacist if you have questions. COMMON BRAND NAME(S): Zometa What should I tell my health care Shannon Rush before I take this medicine? They need to know if you have any of these conditions: -aspirin-sensitive asthma -cancer, especially if you are receiving medicines used to treat cancer -dental disease or wear dentures -infection -kidney disease -receiving corticosteroids like dexamethasone or prednisone -an unusual or allergic reaction to Shannon Rush, other medicines, foods, dyes, or preservatives -pregnant or trying to get pregnant -breast-feeding How should I use this medicine? This medicine is for infusion into a vein. It is given by a health care professional in a hospital or clinic setting. Talk to your pediatrician regarding the use of this medicine in children. Special care may be needed. Overdosage: If you think you have taken too much of this medicine contact a poison control center or emergency room at once. NOTE: This medicine is only for you. Do not share this medicine with others. What if I miss a dose? It is important not to miss your dose. Call your doctor or health care professional if you are unable to keep an appointment. What may interact with this medicine? -certain antibiotics given by injection -NSAIDs, medicines for pain and inflammation, like ibuprofen or naproxen -some diuretics like bumetanide, furosemide -teriparatide -thalidomide This list may not describe all possible interactions. Give your health care Shannon Rush a list of all the medicines, herbs, non-prescription drugs, or  dietary supplements you use. Also tell them if you smoke, drink alcohol, or use illegal drugs. Some items may interact with your medicine. What should I watch for while using this medicine? Visit your doctor or health care professional for regular checkups. It may be some time before you see the benefit from this medicine. Do not stop taking your medicine unless your doctor tells you to. Your doctor may order blood tests or other tests to see how you are doing. Women should inform their doctor if they wish to become pregnant or think they might be pregnant. There is a potential for serious side effects to an unborn child. Talk to your health care professional or pharmacist for more information. You should make sure that you get enough calcium and vitamin D while you are taking this medicine. Discuss the foods you eat and the vitamins you take with your health care professional. Some people who take this medicine have severe bone, joint, and/or muscle pain. This medicine may also increase your risk for jaw problems or a broken thigh bone. Tell your doctor right away if you have severe pain in your jaw, bones, joints, or muscles. Tell your doctor if you have any pain that does not go away or that gets worse. Tell your dentist and dental surgeon that you are taking this medicine. You should not have major dental surgery while on this medicine. See your dentist to have a dental exam and fix any dental problems before starting this medicine. Take good care of your teeth while on this medicine. Make sure you see your dentist for regular follow-up appointments. What side effects may I notice from receiving this medicine? Side effects that   you should report to your doctor or health care professional as soon as possible: -allergic reactions like skin rash, itching or hives, swelling of the face, lips, or tongue -anxiety, confusion, or depression -breathing problems -changes in vision -eye pain -feeling faint or  lightheaded, falls -jaw pain, especially after dental work -mouth sores -muscle cramps, stiffness, or weakness -trouble passing urine or change in the amount of urine Side effects that usually do not require medical attention (report to your doctor or health care professional if they continue or are bothersome): -bone, joint, or muscle pain -constipation -diarrhea -fever -hair loss -irritation at site where injected -loss of appetite -nausea, vomiting -stomach upset -trouble sleeping -trouble swallowing -weak or tired This list may not describe all possible side effects. Call your doctor for medical advice about side effects. You may report side effects to FDA at 1-800-FDA-1088. Where should I keep my medicine? This drug is given in a hospital or clinic and will not be stored at home. NOTE: This sheet is a summary. It may not cover all possible information. If you have questions about this medicine, talk to your doctor, pharmacist, or health care Shannon Rush.  2014, [employer]. (2012-12-10 13:03:13)  

## 2013-12-28 NOTE — Progress Notes (Signed)
[place], [place] [address]  DIAGNOSIS: Multiple myeloma - Plan: CBC with Differential, Comprehensive metabolic panel (Cmet) - [facility], Kappa/lambda light chains, SPEP & IFE with QIG  Chief Complaint  Patient presents with  . Multiple Myeloma    CURRENT TREATMENT:  Zometa 3.5 mg monthly;  Revlimide 5 mg daily for 21 days then off for 7 days for maintenance for at least 2 years started on 09/02/2013.  Changed to 14 days then off for 7 days on 11/26/2013 based on her counts.     Multiple myeloma   05/26/2012 Initial Diagnosis Presenting IgG was 4,040 mg/dL on 06/05/2012 (IgA 35; IgM 34); kappa free light chain 34.4 mg/dL, lambda 0.00, kappa:lambda ratio of 34.75; SPEP with M-spike of 2.60.   06/30/2012 Imaging Numerous lytic lesions throughout the calvarium.  Lytic lesion within the left lateral scapula.  Findings most compatible with metastases or myeloma. Multiple mid and lower thoracic compression fractures.  Slight compression through the endplates at L3.   89/37/3428 Bone Marrow Biopsy Bone marrow biopsy showed 49% plasma cell. Normal classical cytogenetics; however, myeloma FISH panel showed 13q- (intermediate risk)       07/14/2012 - 07/27/2012 Radiation Therapy Palliative radiation 20 Gy over 10 fractions between to thoracic spine cord compression and symptomatic left scapula lytic lesion.   08/10/2012 -  Chemotherapy Started SQ Velcade once weekly, 3 weeks on, 1 weeks off; daily Revlimid d1-21, 7 days off; and Dexamethasone 75m PO weekly.    02/12/2013 [facility]    06/14/2013 Tumor Marker IgG  1830,  Kappa:lamba ratio, 1.69 (baseline was 34.75 or   M-spike 0.28 (baseline of 2.6 or  89.3% of baseline).     06/25/2013 -  Chemotherapy Started zometa 3.5 mg monthly    09/01/2013 -  Chemotherapy Maintenance therapy with revlimid 56mdaily for 14 days and  then off for 7 (decreased from 21 days on and 7 days off based on neutropenia).    INTERVAL HISTORY: Shannon M[age]SE89.o. female with a past medical history of MM as detailed above, s/p SCT is here for follow-up. She was seen this past 12/20/2013.  She reports improvement in her breathing .  CXR last week demonstrated bibasilar atelectasis and edema.  She has take lasix with improvement.  In addition, she was started on doxycyline 100 mg bid for fever and concerns for pneumonia.  She denies any further fevers.  She is feeling better.  Last visit, her revlimid was adjusted to 2.5 mg daily for 3 weeks on and 1 week off due to low counts. She continues on zometa monthly.   MEDICAL HISTORY: Past Medical History  Diagnosis Date  . Multiple myeloma 07/01/2012  . Unspecified deficiency anemia   . OSTEOPOROSIS 06/11/2010    Multiple compression fractures; and spontaneous fracture of sternum Qualifier: Diagnosis of  By: Shannon Rush, SuManuela Schwartz   . 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 Cord compression 07/13/12    MRI- diffuse myeloma involvement of T-L spine  . History of radiation therapy 07/13/12-07/27/12    spinal cord compression T3-T10,left scapula    INTERIM HISTORY: has Anemia, macrocytic; DJD, UNSPECIFIED; OSTEOPOROSIS; KYPHOSIS; LUMBOSACRAL STRAIN; Sciatica of left side; Abdominal pain, acute, right upper quadrant; Thoracic back pain; Multiple myeloma; Cord compression; Abnormal echocardiogram; Swelling, mass, or lump in chest; Abnormal electrocardiogram; History of peripheral stem cell  transplant; and Unspecified vitamin D deficiency on her problem list.    ALLERGIES:  is allergic to zithromax and ciprofloxacin.  MEDICATIONS: has a current medication list which includes the following prescription(s): acyclovir, calcium carbonate, doxycycline, furosemide, hydrocodone-acetaminophen, hydrocortisone cream, lenalidomide, morphine, morphine, and ondansetron.  SURGICAL HISTORY:  Past Surgical  History  Procedure Laterality Date  . Appendectomy    . Tubal ligation    . Cesarean section      x2   . Hip surgery  2009    left  . Elbow surgery    . Colonoscopy  2007    neg with Dr. [name]    REVIEW OF SYSTEMS:   Constitutional: Denies abnormal weight loss Eyes: Denies blurriness of vision Ears, nose, mouth, throat, and face: Denies mucositis or sore throat Respiratory: Denies cough, dyspnea or wheezes Cardiovascular: Denies palpitation, chest discomfort or lower extremity swelling Gastrointestinal:  Denies nausea, heartburn or change in bowel habits Skin: Denies abnormal skin rashes Lymphatics: Denies new lymphadenopathy or easy bruising Neurological:Denies numbness, tingling or new weaknesses Behavioral/Psych: Mood is stable, no new changes  All other systems were reviewed with the patient and are negative.  PHYSICAL EXAMINATION: ECOG PERFORMANCE STATUS: 0 - Asymptomatic  Blood pressure 120/79, pulse 83, temperature 97.7 F (36.5 C), temperature source Oral, resp. rate 18, height 5' 6.5" (1.689 m), weight 119 lb 6.4 oz (54.159 kg), SpO2 99.00%.  GENERAL:alert, no distress and comfortable; easily mobile to exam table; kyphosis, moderate.  SKIN: skin color, texture, turgor are normal, no rashes or significant lesions; Abrasion well healed on RLE.  EYES: normal, Conjunctiva are pink and non-injected, sclera clear OROPHARYNX:no exudate, no erythema and lips, buccal mucosa, and tongue normal  NECK: supple, thyroid normal size, non-tender, without nodularity; nodules cervical bilaterally.  LYMPH:  no palpable lymphadenopathy in the cervical, axillary or supraclavicular LUNGS: CTAB/L, no wheezes or rhonchi HEART: regular rate & rhythm and no murmurs and no lower extremity edema ABDOMEN:abdomen soft, non-tender and normal bowel sounds Musculoskeletal:no cyanosis of digits and no clubbing  NEURO: alert & oriented x 3 with fluent speech, no focal motor/sensory deficits  Labs:   Lab Results  Component Value Date   WBC 3.3* 12/28/2013   HGB 10.9* 12/28/2013   HCT 32.8* 12/28/2013   MCV 102.8* 12/28/2013   PLT 196 12/28/2013   NEUTROABS 2.4 12/28/2013      Chemistry      Component Value Date/Time   NA 140 12/20/2013 1439   NA 133* 05/08/2012 0922   K 3.5 12/20/2013 1439   K 4.2 05/08/2012 0922   CL 106 11/26/2012 0936   CL 100 05/08/2012 0922   CO2 26 12/20/2013 1439   CO2 27 05/08/2012 0922   BUN 17.1 12/20/2013 1439   BUN 20 05/08/2012 0922   CREATININE 0.8 12/20/2013 1439   CREATININE 0.67 07/13/2012 1310   CREATININE 0.88 05/08/2012 0922   CREATININE 0.55 04/01/2011 0250      Component Value Date/Time   CALCIUM 9.3 12/20/2013 1439   CALCIUM 9.4 05/08/2012 0922   ALKPHOS 71 11/26/2013 1031   ALKPHOS 77 05/08/2012 0922   AST 21 11/26/2013 1031   AST 21 05/08/2012 0922   ALT 18 11/26/2013 1031   ALT 15 05/08/2012 0922   BILITOT 0.71 11/26/2013 1031   BILITOT 0.6 05/08/2012 0922     CBC:  Recent Labs Lab 12/28/13 1125  WBC 3.3*  NEUTROABS 2.4  HGB 10.9*  HCT 32.8*  MCV 102.8*  PLT 196   Studies:  No results found.   RADIOGRAPHIC STUDIES: METASTATIC BONE SURVEY  COMPARISON: DG BONE SURVEY MET dated 06/30/2012  FINDINGS:  Multiple lytic lesions are noted throughout the skull. These are  stable in appearance and consistent with the patient's history of  multiple myeloma. Progressive mid and lower thoracic spine  compression fractures are noted. No prominent retropulsed fragments  noted. Persistent left scapular lesion is present. Left posterior  rib lytic lesions are present consistent with myeloma. Lytic lesion  of the distal left scapula. Deformity of the left clavicle is noted.  Focal lesion with associated healing fracture could present in this  fashion.  IMPRESSION:  1. Persistent unchanged multiple skull lesions.  2. Persistent left scapular lesion.  3. Lytic lesion in the distal right clavicle. Deformity of the left  clavicle consistent  with focal lesion with associated fracture and  healing.  4. Progressive thoracic spine compression fractures. No retropulsed  fragments noted.  5. Multiple lytic left posterior rib lesions.  These findings are all consistent with the patient's history of  multiple myeloma  ASSESSMENT: Shannon Rush 73 y.o. female with a history of Multiple myeloma - Plan: CBC with Differential, Comprehensive metabolic panel (Cmet) - [facility], Kappa/lambda light chains, SPEP & IFE with QIG   PLAN:  1. Dyspne/Cough, improving. --We ordered a cxr to rule out pneumonia.  It revealed atelectasis and mild pulmonary edema.  We will give her lasix 20 mg daily x 5-7 days to determine if her symptoms improve.  We also started doxycyline as she reported low grade fevers.  She is on Day #6 of doxycycline 100 mg bid.   2. Pancytopenia due to chemotherapy, improving. --We will continue  her dose to 2.5 mg daily for 3 weeks on and then one week off.    3. IgG kappa Multiple myeloma s/p Auto SCT, intermediate risk.  --MM markers demonstrate a very good response.     - Acyclovir 445m PO daily to prevent risk of viral reactivation for one year post transplant.  -- She started revlimid 5 mg daily for maintenance on 09/02/2013.   Decreased to 2.5 daily for 3 weeks on and then one week off on 12/20/2013.  -- We restarted zometa 3.5 mg q 4 weeks on 06/25/2013. She will receive today.  --Now 9 months from SCT. --Skeletal survey as noted above.   4. Osteoporosis.  -- [facility]. She will continue calcium, vitamin D and weight bearing exercises. She will continue zometa as noted above.   5. Follow-up.  --She will follow up in  4 weeks with labs while on revlimide. Zometa next visit.   All questions were answered. The patient knows to call the clinic with any problems, questions or concerns. We can certainly see the patient much sooner if necessary.  I spent  15 minutes counseling the patient face to face. The total time spent in the appointment was 25 minutes.    Saide Lanuza, MD 12/28/2013 12:16 PM

## 2013-12-30 ENCOUNTER — Telehealth: Payer: Self-pay | Admitting: Internal Medicine

## 2013-12-30 NOTE — Telephone Encounter (Signed)
s.w. pt husband and advised on July appt...sed added tx...Marland Kitchenok and awre

## 2014-01-13 ENCOUNTER — Other Ambulatory Visit: Payer: Self-pay | Admitting: *Deleted

## 2014-01-13 DIAGNOSIS — C9000 Multiple myeloma not having achieved remission: Secondary | ICD-10-CM

## 2014-01-13 MED ORDER — LENALIDOMIDE 2.5 MG PO CAPS: ORAL_CAPSULE | ORAL | Status: DC

## 2014-01-21 ENCOUNTER — Ambulatory Visit: Payer: Medicare Other

## 2014-01-21 ENCOUNTER — Other Ambulatory Visit: Payer: Medicare Other

## 2014-01-24 ENCOUNTER — Telehealth: Payer: Self-pay | Admitting: Internal Medicine

## 2014-01-24 NOTE — Telephone Encounter (Signed)
pt could not make appt r/s to diff day...done

## 2014-01-25 ENCOUNTER — Ambulatory Visit (HOSPITAL_BASED_OUTPATIENT_CLINIC_OR_DEPARTMENT_OTHER): Payer: Medicare Other

## 2014-01-25 ENCOUNTER — Telehealth: Payer: Self-pay | Admitting: Internal Medicine

## 2014-01-25 ENCOUNTER — Encounter: Payer: Self-pay | Admitting: Internal Medicine

## 2014-01-25 ENCOUNTER — Other Ambulatory Visit (HOSPITAL_BASED_OUTPATIENT_CLINIC_OR_DEPARTMENT_OTHER): Payer: Medicare Other

## 2014-01-25 ENCOUNTER — Ambulatory Visit (HOSPITAL_BASED_OUTPATIENT_CLINIC_OR_DEPARTMENT_OTHER): Payer: Medicare Other | Admitting: Internal Medicine

## 2014-01-25 VITALS — BP 126/70 | HR 73 | Temp 97.5°F | Resp 20 | Ht 66.5 in | Wt 122.2 lb

## 2014-01-25 DIAGNOSIS — C9000 Multiple myeloma not having achieved remission: Secondary | ICD-10-CM

## 2014-01-25 DIAGNOSIS — D61811 Other drug-induced pancytopenia: Secondary | ICD-10-CM

## 2014-01-25 DIAGNOSIS — M810 Age-related osteoporosis without current pathological fracture: Secondary | ICD-10-CM

## 2014-01-25 LAB — CBC WITH DIFFERENTIAL/PLATELET
BASO%: 1.4 % (ref 0.0–2.0)
BASOS ABS: 0 10*3/uL (ref 0.0–0.1)
EOS ABS: 0.1 10*3/uL (ref 0.0–0.5)
EOS%: 4.2 % (ref 0.0–7.0)
HEMATOCRIT: 33.7 % — AB (ref 34.8–46.6)
HEMOGLOBIN: 11.2 g/dL — AB (ref 11.6–15.9)
LYMPH#: 0.6 10*3/uL — AB (ref 0.9–3.3)
LYMPH%: 20.9 % (ref 14.0–49.7)
MCH: 34.7 pg — ABNORMAL HIGH (ref 25.1–34.0)
MCHC: 33.1 g/dL (ref 31.5–36.0)
MCV: 104.8 fL — ABNORMAL HIGH (ref 79.5–101.0)
MONO#: 0.4 10*3/uL (ref 0.1–0.9)
MONO%: 16.1 % — ABNORMAL HIGH (ref 0.0–14.0)
NEUT%: 57.4 % (ref 38.4–76.8)
NEUTROS ABS: 1.5 10*3/uL (ref 1.5–6.5)
PLATELETS: 206 10*3/uL (ref 145–400)
RBC: 3.22 10*6/uL — ABNORMAL LOW (ref 3.70–5.45)
RDW: 17 % — ABNORMAL HIGH (ref 11.2–14.5)
WBC: 2.7 10*3/uL — AB (ref 3.9–10.3)

## 2014-01-25 LAB — COMPREHENSIVE METABOLIC PANEL (CC13)
ALT: 15 U/L (ref 0–55)
ANION GAP: 7 meq/L (ref 3–11)
AST: 22 U/L (ref 5–34)
Albumin: 3.7 g/dL (ref 3.5–5.0)
Alkaline Phosphatase: 63 U/L (ref 40–150)
BILIRUBIN TOTAL: 0.57 mg/dL (ref 0.20–1.20)
BUN: 14.9 mg/dL (ref 7.0–26.0)
CHLORIDE: 108 meq/L (ref 98–109)
CO2: 26 meq/L (ref 22–29)
CREATININE: 0.8 mg/dL (ref 0.6–1.1)
Calcium: 9.3 mg/dL (ref 8.4–10.4)
GLUCOSE: 82 mg/dL (ref 70–140)
Potassium: 3.8 mEq/L (ref 3.5–5.1)
Sodium: 142 mEq/L (ref 136–145)
Total Protein: 7.2 g/dL (ref 6.4–8.3)

## 2014-01-25 MED ORDER — ZOLEDRONIC ACID 4 MG/5ML IV CONC
3.5000 mg | Freq: Once | INTRAVENOUS | Status: AC
  Administered 2014-01-25: 3.5 mg via INTRAVENOUS
  Filled 2014-01-25: qty 4.38

## 2014-01-25 MED ORDER — SODIUM CHLORIDE 0.9 % IV SOLN
Freq: Once | INTRAVENOUS | Status: AC
  Administered 2014-01-25: 16:00:00 via INTRAVENOUS

## 2014-01-25 NOTE — Patient Instructions (Signed)

## 2014-01-25 NOTE — Telephone Encounter (Signed)
gv adn printed aptp sched and avs for pt for Aug...sed added tx. °

## 2014-01-25 NOTE — Progress Notes (Signed)
[place], [place] [address]  DIAGNOSIS: Multiple myeloma - Plan: CBC with Differential, Comprehensive metabolic panel (Cmet) - [facility], Lactate dehydrogenase (LDH) - [facility]  Chief Complaint  Patient presents with  . Multiple Myeloma    CURRENT TREATMENT:  Zometa 3.5 mg monthly started on 06/2013;  Revlimide 5 mg daily for 21 days then off for 7 days for maintenance for at least 2 years started on 09/02/2013.  Decreased to revlimide 2.5 mg daily for 21 days then off for 7 days on 12/30/2013.     Multiple myeloma   05/26/2012 Initial Diagnosis Presenting IgG was 4,040 mg/dL on 06/05/2012 (IgA 35; IgM 34); kappa free light chain 34.4 mg/dL, lambda 0.00, kappa:lambda ratio of 34.75; SPEP with M-spike of 2.60.   06/30/2012 Imaging Numerous lytic lesions throughout the calvarium.  Lytic lesion within the left lateral scapula.  Findings most compatible with metastases or myeloma. Multiple mid and lower thoracic compression fractures.  Slight compression through the endplates at L3.   60/45/4098 Bone Marrow Biopsy Bone marrow biopsy showed 49% plasma cell. Normal classical cytogenetics; however, myeloma FISH panel showed 13q- (intermediate risk)       07/14/2012 - 07/27/2012 Radiation Therapy Palliative radiation 20 Gy over 10 fractions between to thoracic spine cord compression and symptomatic left scapula lytic lesion.   08/10/2012 -  Chemotherapy Started SQ Velcade once weekly, 3 weeks on, 1 weeks off; daily Revlimid d1-21, 7 days off; and Dexamethasone 76m PO weekly.    02/12/2013 [facility]    06/14/2013 Tumor Marker IgG  1830,  Kappa:lamba ratio, 1.69 (baseline was 34.75 or   M-spike 0.28 (baseline of 2.6 or  89.3% of baseline).     06/25/2013 -  Chemotherapy Started zometa 3.5 mg monthly    09/01/2013 -  Chemotherapy Maintenance therapy with revlimid 526mdaily for  14 days and then off for 7 (decreased from 21 days on and 7 days off based on neutropenia).    12/20/2013 Treatment Plan Change Maintenance therapy decreased to 2.5 mg daily for 21 days and then off for 7 days based on low counts.    INTERVAL HISTORY: ClTHRESSA SHIFFER89.o. female with a past medical history of MM as detailed above, s/p SCT is here for follow-up. She remains on revlimid 2.5 mg for 21 days on and 7 days off.  She started about 4 days ago.  She complains of worsening productive cough and dyspnea especially on exertion. She denies fevers or exposures to sick contacts.    MEDICAL HISTORY: Past Medical History  Diagnosis Date  . Multiple myeloma 07/01/2012  . Unspecified deficiency anemia   . OSTEOPOROSIS 06/11/2010    Multiple compression fractures; and spontaneous fracture of sternum Qualifier: Diagnosis of  By: SaZebedee IbaP, SuManuela Schwartz   . 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 Cord compression 07/13/12    MRI- diffuse myeloma involvement of T-L spine  . History of radiation therapy 07/13/12-07/27/12    spinal cord compression T3-T10,left scapula    INTERIM HISTORY: has Anemia, macrocytic; DJD, UNSPECIFIED; OSTEOPOROSIS; KYPHOSIS; LUMBOSACRAL STRAIN; Sciatica of left side; Abdominal pain, acute, right upper quadrant; Thoracic back pain; Multiple myeloma; Cord compression; Abnormal echocardiogram; Swelling, mass, or lump in chest; Abnormal electrocardiogram; History of peripheral stem cell transplant; and Unspecified vitamin D deficiency on her problem list.    ALLERGIES:  is  allergic to zithromax and ciprofloxacin.  MEDICATIONS: has a current medication list which includes the following prescription(s): acyclovir, calcium carbonate, hydrocodone-acetaminophen, hydrocortisone cream, lenalidomide, morphine, morphine, and ondansetron.  SURGICAL HISTORY:  Past Surgical History  Procedure Laterality Date  . Appendectomy    . Tubal ligation    . Cesarean section      x2   . Hip  surgery  2009    left  . Elbow surgery    . Colonoscopy  2007    neg with Dr. [name]    REVIEW OF SYSTEMS:   Constitutional: Denies abnormal weight loss Eyes: Denies blurriness of vision Ears, nose, mouth, throat, and face: Denies mucositis or sore throat Respiratory: Denies cough, dyspnea or wheezes Cardiovascular: Denies palpitation, chest discomfort or lower extremity swelling Gastrointestinal:  Denies nausea, heartburn or change in bowel habits Skin: Denies abnormal skin rashes Lymphatics: Denies new lymphadenopathy or easy bruising Neurological:Denies numbness, tingling or new weaknesses Behavioral/Psych: Mood is stable, no new changes  All other systems were reviewed with the patient and are negative.  PHYSICAL EXAMINATION: ECOG PERFORMANCE STATUS: 0 - Asymptomatic  Blood pressure 126/70, pulse 73, temperature 97.5 F (36.4 C), temperature source Oral, resp. rate 20, height 5' 6.5" (1.689 m), weight 122 lb 3.2 oz (55.43 kg), SpO2 100.00%.  GENERAL:alert, no distress and comfortable; easily mobile to exam table; kyphosis, moderate.  SKIN: skin color, texture, turgor are normal, no rashes or significant lesions; Abrasion well healed on RLE.  EYES: normal, Conjunctiva are pink and non-injected, sclera clear OROPHARYNX:no exudate, no erythema and lips, buccal mucosa, and tongue normal  NECK: supple, thyroid normal size, non-tender, without nodularity; nodules cervical bilaterally.  LYMPH:  no palpable lymphadenopathy in the cervical, axillary or supraclavicular LUNGS: crackles at the bases bilaterally with normal breathing effort, no wheezes or rhonchi HEART: regular rate & rhythm and no murmurs and no lower extremity edema ABDOMEN:abdomen soft, non-tender and normal bowel sounds Musculoskeletal:no cyanosis of digits and no clubbing  NEURO: alert & oriented x 3 with fluent speech, no focal motor/sensory deficits  Labs:  Lab Results  Component Value Date   WBC 2.7* 01/25/2014    HGB 11.2* 01/25/2014   HCT 33.7* 01/25/2014   MCV 104.8* 01/25/2014   PLT 206 01/25/2014   NEUTROABS 1.5 01/25/2014      Chemistry      Component Value Date/Time   NA 142 01/25/2014 1508   NA 133* 05/08/2012 0922   K 3.8 01/25/2014 1508   K 4.2 05/08/2012 0922   CL 106 11/26/2012 0936   CL 100 05/08/2012 0922   CO2 26 01/25/2014 1508   CO2 27 05/08/2012 0922   BUN 14.9 01/25/2014 1508   BUN 20 05/08/2012 0922   CREATININE 0.8 01/25/2014 1508   CREATININE 0.67 07/13/2012 1310   CREATININE 0.88 05/08/2012 0922   CREATININE 0.55 04/01/2011 0250      Component Value Date/Time   CALCIUM 9.3 01/25/2014 1508   CALCIUM 9.4 05/08/2012 0922   ALKPHOS 63 01/25/2014 1508   ALKPHOS 77 05/08/2012 0922   AST 22 01/25/2014 1508   AST 21 05/08/2012 0922   ALT 15 01/25/2014 1508   ALT 15 05/08/2012 0922   BILITOT 0.57 01/25/2014 1508   BILITOT 0.6 05/08/2012 0922     CBC:  Recent Labs Lab 01/25/14 1508  WBC 2.7*  NEUTROABS 1.5  HGB 11.2*  HCT 33.7*  MCV 104.8*  PLT 206   Studies:  No results found.   RADIOGRAPHIC STUDIES: None  ASSESSMENT: TRENT Shannon Rush 73 y.o. female with a history of Multiple myeloma - Plan: CBC with Differential, Comprehensive metabolic panel (Cmet) - [facility], Lactate dehydrogenase (LDH) - [facility]   PLAN:    1. Pancytopenia due to chemotherapy. -- We will continue her dose of revlimid 2.5 mg daily for 3 weeks on and then one week off.  Her ANC is 1500.   2. IgG kappa Multiple myeloma s/p Auto SCT, intermediate risk.  --MM markers demonstrate a very good response.     - Acyclovir 431m PO daily to prevent risk of viral reactivation for one year post transplant.  -- She started revlimid 5 mg daily for maintenance on 09/02/2013.   Decreased to 2.5 daily for 3 weeks on and then one week off on 12/20/2013.   -- We restarted zometa 3.5 mg q 4 weeks on 06/25/2013. She will receive an infusion today and plan for one year (monthly) then every 3 months.  --Now 10 months from  SCT.  3. Osteoporosis.  -- [facility]. She will continue calcium, vitamin D and weight bearing exercises. She will continue zometa as noted above.    4. Follow-up.  She will follow-up with uKoreanext week for a symptom visit. Repeat labs in 4 weeks to assess trend given recent decrease while on revlimide. Zometa next visit. She request to take return following her trip.  It may be delayed to 03/11/2014 at her request.   All questions were answered. The patient knows to call the clinic with any problems, questions or concerns. We can certainly see the patient much sooner if necessary.  I spent 10 minutes counseling the patient face to face. The total time spent in the appointment was 15 minutes.    Flynt Breeze, MD 01/25/2014 9:10 PM

## 2014-01-26 ENCOUNTER — Ambulatory Visit: Payer: Medicare Other

## 2014-01-26 ENCOUNTER — Other Ambulatory Visit: Payer: Medicare Other

## 2014-01-26 ENCOUNTER — Telehealth: Payer: Self-pay | Admitting: Medical Oncology

## 2014-01-26 LAB — KAPPA/LAMBDA LIGHT CHAINS
KAPPA FREE LGHT CHN: 3.38 mg/dL — AB (ref 0.33–1.94)
KAPPA LAMBDA RATIO: 0.87 (ref 0.26–1.65)
Lambda Free Lght Chn: 3.87 mg/dL — ABNORMAL HIGH (ref 0.57–2.63)

## 2014-01-26 NOTE — Telephone Encounter (Signed)
Pt called stating she is concerned about getting her pain medications when Dr. Juliann Mule leaves the practice. She does not take it every day but needs it prn. She was here yesterday but did not need a refill. I explained that we will continue to follow her and we will provide her with her medications. She states she knows she is in good hands and we will take care of her.

## 2014-01-27 LAB — SPEP & IFE WITH QIG
ALPHA-2-GLOBULIN: 8.9 % (ref 7.1–11.8)
Albumin ELP: 55.4 % — ABNORMAL LOW (ref 55.8–66.1)
Alpha-1-Globulin: 6.7 % — ABNORMAL HIGH (ref 2.9–4.9)
BETA GLOBULIN: 5.8 % (ref 4.7–7.2)
Beta 2: 3.2 % (ref 3.2–6.5)
Gamma Globulin: 20 % — ABNORMAL HIGH (ref 11.1–18.8)
IgA: 113 mg/dL (ref 69–380)
IgG (Immunoglobin G), Serum: 1360 mg/dL (ref 690–1700)
IgM, Serum: 43 mg/dL — ABNORMAL LOW (ref 52–322)
M-Spike, %: 0.5 g/dL
TOTAL PROTEIN, SERUM ELECTROPHOR: 6.6 g/dL (ref 6.0–8.3)

## 2014-02-10 ENCOUNTER — Other Ambulatory Visit: Payer: Self-pay

## 2014-02-10 DIAGNOSIS — C9000 Multiple myeloma not having achieved remission: Secondary | ICD-10-CM

## 2014-02-10 MED ORDER — MORPHINE SULFATE 15 MG PO TABS: 15.0000 mg | ORAL_TABLET | Freq: Four times a day (QID) | ORAL | Status: DC | PRN

## 2014-02-10 NOTE — Telephone Encounter (Signed)
Pt request refill of MSIR, called and LVM when Rx was ready for pickup.

## 2014-02-16 ENCOUNTER — Other Ambulatory Visit: Payer: Self-pay | Admitting: *Deleted

## 2014-02-16 NOTE — Telephone Encounter (Signed)
Refill request to MD desk for approval next week. Progress note looks like she will complete current cycle of Revlimid and then wait for her labs/visit on 8/28 to decide on continuation since dose was reduced.

## 2014-02-17 ENCOUNTER — Telehealth: Payer: Self-pay

## 2014-02-17 NOTE — Telephone Encounter (Signed)
Pt requested refill on Revlimid.  Let pt know I would research and return her call. Completed scrip on line.  Let pt know she needs to do pt survey - per pt she has always done by phone.   Let pt know she needed to complete pt survey and provided phone no.  Pt to return call to this RN once completed.  Pt voiced understanding.

## 2014-02-17 NOTE — Telephone Encounter (Signed)
Reviewed script with midlevel-go ahead and refill. Forwarded script to Dr. Alen Blew (on call today).

## 2014-02-17 NOTE — Telephone Encounter (Signed)
Call from [facility]: Patient calling for her Revlimid. They have no record of receiving refill on this. Must have faxed copy of the script sent to [phone].  They are not able to accept a electronic script for this drug.

## 2014-02-18 ENCOUNTER — Other Ambulatory Visit: Payer: Self-pay

## 2014-02-18 DIAGNOSIS — C9000 Multiple myeloma not having achieved remission: Secondary | ICD-10-CM

## 2014-02-18 MED ORDER — LENALIDOMIDE 2.5 MG PO CAPS: ORAL_CAPSULE | ORAL | Status: DC

## 2014-03-01 ENCOUNTER — Encounter: Disposition: A | Discharge status: Home / Self Care | Payer: Self-pay | Source: Ambulatory Visit | Attending: Cardiology

## 2014-03-01 ENCOUNTER — Other Ambulatory Visit: Payer: Self-pay | Admitting: Gastroenterology

## 2014-03-01 ENCOUNTER — Other Ambulatory Visit: Payer: Self-pay | Admitting: Cardiology

## 2014-03-01 ENCOUNTER — Encounter: Payer: Self-pay | Admitting: Cardiology

## 2014-03-01 ENCOUNTER — Inpatient Hospital Stay
Admit: 2014-03-01 | Disposition: A | Discharge status: Home / Self Care | Payer: Self-pay | Source: Ambulatory Visit | Attending: Cardiology | Admitting: Cardiology

## 2014-03-01 DIAGNOSIS — I2119 ST elevation (STEMI) myocardial infarction involving other coronary artery of inferior wall: Principal | ICD-10-CM | POA: Diagnosis present

## 2014-03-01 DIAGNOSIS — I213 ST elevation (STEMI) myocardial infarction of unspecified site: Secondary | ICD-10-CM

## 2014-03-01 HISTORY — DX: ST elevation (STEMI) myocardial infarction involving other coronary artery of inferior wall: I21.19

## 2014-03-01 HISTORY — DX: Multiple myeloma not having achieved remission: C90.00

## 2014-03-01 HISTORY — PX: CARDIAC CATHETERIZATION: SHX172

## 2014-03-01 LAB — DIFF MANUAL
Diff Based On: 115 CELLS
Misc. Cell %: 0 % (ref 0–0)

## 2014-03-01 LAB — LIPID PANEL
Chol/HDL Ratio: 2.4
Cholesterol: 157 mg/dL
HDL: 65 mg/dL
LDL Calculated: 83 mg/dL
Non HDL Cholesterol: 92 mg/dL
Triglycerides: 47 mg/dL

## 2014-03-01 LAB — CBC AND DIFFERENTIAL
Baso # K/uL: 0.1 10*3/uL (ref 0.0–0.1)
Basophil %: 1.7 %
Eos # K/uL: 0 10*3/uL (ref 0.0–0.4)
Eosinophil %: 0.9 %
Hematocrit: 27 % — ABNORMAL LOW (ref 34–45)
Hemoglobin: 8.9 g/dL — ABNORMAL LOW (ref 11.2–15.7)
Lymph # K/uL: 0.4 10*3/uL — ABNORMAL LOW (ref 1.2–3.7)
Lymphocyte %: 12.2 %
MCH: 35 pg/cell — ABNORMAL HIGH (ref 26–32)
MCHC: 33 g/dL (ref 32–36)
MCV: 107 fL — ABNORMAL HIGH (ref 79–95)
Mono # K/uL: 0.1 10*3/uL — ABNORMAL LOW (ref 0.2–0.9)
Monocyte %: 1.7 %
Neut # K/uL: 2.9 10*3/uL (ref 1.6–6.1)
Platelets: 126 10*3/uL — ABNORMAL LOW (ref 160–370)
RBC: 2.5 MIL/uL — ABNORMAL LOW (ref 3.9–5.2)
RDW: 15.5 % — ABNORMAL HIGH (ref 11.7–14.4)
Seg Neut %: 83.5 %
WBC: 3.5 10*3/uL — ABNORMAL LOW (ref 4.0–10.0)

## 2014-03-01 LAB — RBC MORPHOLOGY: RBC Morphology: NORMAL

## 2014-03-01 LAB — BASIC METABOLIC PANEL
Anion Gap: 14 (ref 7–16)
CO2: 21 mmol/L (ref 20–28)
Calcium: 8.5 mg/dL — ABNORMAL LOW (ref 8.6–10.2)
Chloride: 105 mmol/L (ref 96–108)
Creatinine: 0.63 mg/dL (ref 0.51–0.95)
GFR,Black: 104 *
GFR,Caucasian: 90 *
Glucose: 100 mg/dL — ABNORMAL HIGH (ref 60–99)
Lab: 12 mg/dL (ref 6–20)
Potassium: 4.2 mmol/L (ref 3.3–5.1)
Sodium: 140 mmol/L (ref 133–145)

## 2014-03-01 LAB — APTT: aPTT: 195.8 s — ABNORMAL HIGH (ref 25.8–37.9)

## 2014-03-01 SURGERY — CORONARY ANGIOGRAPHY

## 2014-03-01 MED ORDER — HEPARIN INFUSION 100 UNITS/ML (BOLUS FROM BAG) *I*
0.0000 [IU] | INTRAVENOUS | Status: DC | PRN
Start: 2014-03-01 — End: 2014-03-03

## 2014-03-01 MED ORDER — LIDOCAINE HCL 1 % IJ SOLN
INTRAMUSCULAR | Status: AC
Start: 2014-03-01 — End: 2014-03-01
  Filled 2014-03-01: qty 20

## 2014-03-01 MED ORDER — CLOPIDOGREL BISULFATE 75 MG PO TABS *I*
75.0000 mg | ORAL_TABLET | Freq: Every day | ORAL | Status: DC
Start: 2014-03-02 — End: 2014-03-03
  Administered 2014-03-02 – 2014-03-03 (×2): 75 mg via ORAL
  Filled 2014-03-01 (×2): qty 1

## 2014-03-01 MED ORDER — ATORVASTATIN CALCIUM 40 MG PO TABS *I*
40.0000 mg | ORAL_TABLET | Freq: Every day | ORAL | Status: DC
Start: 2014-03-02 — End: 2014-03-03
  Administered 2014-03-02: 40 mg via ORAL
  Filled 2014-03-01: qty 1

## 2014-03-01 MED ORDER — FENTANYL CITRATE 50 MCG/ML IJ SOLN *WRAPPED*
INTRAMUSCULAR | Status: DC | PRN
Start: 2014-03-01 — End: 2014-03-01
  Administered 2014-03-01: 16:00:00 25 ug via INTRAVENOUS

## 2014-03-01 MED ORDER — MIDAZOLAM HCL 5 MG/5ML IJ SOLN *I*
INTRAMUSCULAR | Status: DC | PRN
Start: 2014-03-01 — End: 2014-03-01
  Administered 2014-03-01: 1 mg via INTRAVENOUS

## 2014-03-01 MED ORDER — HEPARIN INFUSION 100 UNITS/ML (DVT-PE PROTOCOL) *I*
0.0000 [IU]/h | INTRAVENOUS | Status: DC
Start: 2014-03-01 — End: 2014-03-03
  Administered 2014-03-01: 2000 [IU]/h via INTRAVENOUS
  Administered 2014-03-01: 1750 [IU]/h via INTRAVENOUS
  Administered 2014-03-02: 10 [IU]/h via INTRAVENOUS
  Administered 2014-03-02 – 2014-03-03 (×4): 1000 [IU]/h via INTRAVENOUS

## 2014-03-01 MED ORDER — CARVEDILOL 3.125 MG PO TABS *I*
3.1250 mg | ORAL_TABLET | Freq: Two times a day (BID) | ORAL | Status: DC
Start: 2014-03-02 — End: 2014-03-02
  Filled 2014-03-01 (×2): qty 1

## 2014-03-01 MED ORDER — ASPIRIN 81 MG PO TBEC *I*
81.0000 mg | DELAYED_RELEASE_TABLET | Freq: Every day | ORAL | Status: DC
Start: 2014-03-02 — End: 2014-03-03
  Administered 2014-03-02 – 2014-03-03 (×2): 81 mg via ORAL
  Filled 2014-03-01 (×2): qty 1

## 2014-03-01 MED ORDER — ACETAMINOPHEN 325 MG PO TABS *I*
650.0000 mg | ORAL_TABLET | Freq: Four times a day (QID) | ORAL | Status: DC | PRN
Start: 2014-03-01 — End: 2014-03-03

## 2014-03-01 MED ORDER — SODIUM CHLORIDE 0.9 % IV SOLN WRAPPED *I*
75.0000 mL/h | Status: DC
Start: 2014-03-01 — End: 2014-03-02

## 2014-03-01 MED ORDER — SODIUM CHLORIDE 0.9 % IV SOLN WRAPPED *I*
100.0000 mL/h | Status: AC
Start: 2014-03-01 — End: 2014-03-01

## 2014-03-01 MED ORDER — MIDAZOLAM HCL 5 MG/5ML IJ SOLN *I*
INTRAMUSCULAR | Status: AC
Start: 2014-03-01 — End: 2014-03-01
  Filled 2014-03-01: qty 5

## 2014-03-01 MED ORDER — HEPARIN SODIUM (PORCINE) 1000 UNIT/ML IJ SOLN *WRAPPED*
Status: AC
Start: 2014-03-01 — End: 2014-03-01
  Filled 2014-03-01: qty 10

## 2014-03-01 MED ORDER — NITROGLYCERIN IN D5W 100 MCG/ML IV SOLN *I*
INTRAVENOUS | Status: AC
Start: 2014-03-01 — End: 2014-03-01
  Filled 2014-03-01: qty 250

## 2014-03-01 MED ORDER — FENTANYL CITRATE 50 MCG/ML IJ SOLN *WRAPPED*
INTRAMUSCULAR | Status: AC
Start: 2014-03-01 — End: 2014-03-01
  Filled 2014-03-01: qty 2

## 2014-03-01 MED ORDER — HEPARIN INFUSION IN 0.45% NACL 100 UNITS/ML(STANDARD) *I*
INTRAMUSCULAR | Status: DC | PRN
Start: 2014-03-01 — End: 2014-03-01
  Administered 2014-03-01: 20 mL via INTRAVENOUS

## 2014-03-01 SURGICAL SUPPLY — 11 items
APPLICATOR CHLORAPREP 3ML (Solution) ×8 IMPLANT
BAND TR STANDARD (Closure Device) ×1
CATH OPTI RADI 6F TIG 4.0 (Cardiac Catheter (Interventional)) ×2 IMPLANT
DEVICE COMPR L24CM REG RAD TR BND (Closure Device) ×1 IMPLANT
GUIDEWIRE .035-145-3 (Guidewire) ×2 IMPLANT
KIT ANGIO MFLD PT BT2000 W/ TRNSDUC (Supply) ×1 IMPLANT
KIT GLIDRSHEATH SLENDER USE 237757 (Sheath) ×2 IMPLANT
KIT HAND CONTROL 54IN TUBING (Supply) ×2 IMPLANT
KIT MANIFOLD (Supply) ×1
PACK CUSTOM CARDIAC CATH PACK (Pack) ×2 IMPLANT
SHIELD RADPAD INTERVENTIONAL (Supply) ×2 IMPLANT
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
## 2014-03-01 NOTE — H&P (Signed)
Subjective:     73 YO F with multiple myeloma s/p stem cell transplant, presents with inferior STEMI. She received thrombolytics at [facility] and ws transferred for [facility] for cardiac     History   Smoking status    Not on file   Smokeless tobacco    Not on file       Allergies:   Allergies   Allergen Reactions    Zithromax [Azithromycin] Hives    Ciprofloxacin Nausea Only       Prior to Admission Medications:    (Not in a hospital admission)    Active Hospital Medications:  No current outpatient prescriptions on file.     No current facility-administered medications for this encounter.          Objective:     Physical Exam  Vitals:  Blood pressure 119/63, pulse 63, temperature 37.3 C (99.1 F), temperature source Temporal, resp. rate 18, height 1.676 m (5' 6"), weight 55.792 kg (123 lb), SpO2 100 %.    Vitals in last 24 hrs:  Patient Vitals for the past 24 hrs:   BP Temp Temp src Pulse Resp SpO2 Height Weight   03/01/14 1517 119/63 mmHg - - - - - - -   03/01/14 1513 115/65 mmHg 37.3 C (99.1 F) TEMPORAL 63 18 100 % 1.676 m (5' 6") 55.792 kg (123 lb)     O2 Device: Nasal cannula (03/01/14 1513)  O2 Flow Rate: 2 L/min (03/01/14 1513)      Pulses:   L radial   R radial    L Femoral   R Femoral    L posterior tibial   R posterior tibial    L dorsalis pedis   R dorsalis pedis        BMI: Body mass index is 19.86 kg/(m^2).    Jugular venous pressure: 5 cm    Airway Visibility: uvula    Breath sounds: clear  Cardiovascular:  normal S1,S2 without murmur, rubs, gallops    Neuro exam: nonfocal  Lab Review   reviewed  CKD stage: (1=slight, GFR>90 / 2=mild / 3=moderate / 4=severe / 5=end-stage, PFX<90) Not Applicable    Anesthesiologist's Physical Status rating of the patient: Class IV: Severe Systemic Disease / Constant Threat to Health    Plan for sedation: Moderate  I am evaluating the patient immediately prior to admission of sedation medication. The plan for sedation remains  appropriate.      Assessment:   73 YO F with inferior STEMI   Indications for procedure: acute coronary syndrome     Plan:   Cardiac cath to rule out ischemic CAD.  Possible angioplasty.      Author: Michaelle Copas, MD  as of: 03/01/2014  at: 3:23 PM

## 2014-03-02 ENCOUNTER — Observation Stay: Payer: Self-pay

## 2014-03-02 ENCOUNTER — Encounter: Payer: Self-pay | Admitting: Cardiology

## 2014-03-02 LAB — ECHO COMPLETE
Aortic Diameter (sinus of Valsalva): 3.5 cm
BMI: 19.9 kg/m2
BP Diastolic: 58 mmHg
BP Systolic: 90 mmHg
BSA: 1.61 m2
Deceleration Time - MV: 199 ms
E/A ratio: 1.64
Echo RV Stroke Work Index Estimate: 1324.8 mmHg•mL/m2
Heart Rate: 73 {beats}/min
Height: 65.984 in
LA Diameter BSA Index: 2.7 cm/m2
LA Diameter Height Index: 2.6 cm/m
LA Diameter: 4.3 cm
LV ASE Mass BSA Index: 88.6 gm/m2
LV ASE Mass Height 2.7 Index: 35.4 gm/m2.7
LV ASE Mass Height Index: 85.1 gm/m
LV ASE Mass: 142.7 gm
LV CO BSA Index: 3.58 L/min/m2
LV Cardiac Output: 5.77 L/min
LV Diastolic Volume Index: 66.5 mL/m2
LV Posterior Wall Thickness: 0.9 cm
LV SV BSA Index: 49.1 mL/m2
LV SV Height Index: 47.1 mL/m
LV Septal Thickness: 0.9 cm
LV Stroke Volume: 79 mL
LV Systolic Volume Index: 17.4 mL/m2
LVED Diameter BSA Index: 2.9 cm/m2
LVED Diameter Height Index: 2.8 cm/m
LVED Diameter: 4.7 cm
LVED Volume BSA Index: 66 ml/m2
LVED Volume BSA Index: 66.5 mL/m2
LVED Volume Height Index: 63.8 mL/m
LVED Volume: 107 mL
LVEF (Volume): 74 %
LVES Volume BSA Index: 17 ml/m2
LVES Volume BSA Index: 17.4 mL/m2
LVES Volume Height Index: 16.7 mL/m
LVES Volume: 28 mL
LVOT PWD VTI: 17.6 cm
MV Peak A Velocity: 45 cm/s
MV Peak E Velocity: 74 cm/s
Mitral Annular E/Ea Vel Ratio: 9.25
Mitral Annular Ea Velocity: 8 cm/s
Peak Gradient - TR: 27 mmHg
Pulmonary Vascular Resistance Estimate: 4.7 mmHg
RA Pressure Estimate: 7 mmHg
RR Interval: 821.92 ms
RV Peak Systolic Pressure: 34 mmHg
Weight: 1968 oz

## 2014-03-02 LAB — BASIC METABOLIC PANEL
Anion Gap: 11 (ref 7–16)
CO2: 25 mmol/L (ref 20–28)
Calcium: 8.2 mg/dL — ABNORMAL LOW (ref 8.6–10.2)
Chloride: 104 mmol/L (ref 96–108)
Creatinine: 0.74 mg/dL (ref 0.51–0.95)
GFR,Black: 94 *
GFR,Caucasian: 81 *
Glucose: 91 mg/dL (ref 60–99)
Lab: 16 mg/dL (ref 6–20)
Potassium: 4.1 mmol/L (ref 3.3–5.1)
Sodium: 140 mmol/L (ref 133–145)

## 2014-03-02 LAB — HCT AND HGB
Hematocrit: 28 % — ABNORMAL LOW (ref 34–45)
Hemoglobin: 9.2 g/dL — ABNORMAL LOW (ref 11.2–15.7)

## 2014-03-02 LAB — DIFF MANUAL
Diff Based On: 115 CELLS
Misc. Cell %: 0 % (ref 0–0)

## 2014-03-02 LAB — CBC AND DIFFERENTIAL
Baso # K/uL: 0.1 10*3/uL (ref 0.0–0.1)
Basophil %: 1.7 %
Eos # K/uL: 0.1 10*3/uL (ref 0.0–0.4)
Eosinophil %: 1.7 %
Hematocrit: 26 % — ABNORMAL LOW (ref 34–45)
Hemoglobin: 8.7 g/dL — ABNORMAL LOW (ref 11.2–15.7)
Lymph # K/uL: 0.4 10*3/uL — ABNORMAL LOW (ref 1.2–3.7)
Lymphocyte %: 12.2 %
MCH: 35 pg/cell — ABNORMAL HIGH (ref 26–32)
MCHC: 33 g/dL (ref 32–36)
MCV: 107 fL — ABNORMAL HIGH (ref 79–95)
Mono # K/uL: 0.2 10*3/uL (ref 0.2–0.9)
Monocyte %: 6.1 %
Neut # K/uL: 2.8 10*3/uL (ref 1.6–6.1)
Platelets: 122 10*3/uL — ABNORMAL LOW (ref 160–370)
RBC: 2.5 MIL/uL — ABNORMAL LOW (ref 3.9–5.2)
RDW: 15.6 % — ABNORMAL HIGH (ref 11.7–14.4)
Seg Neut %: 78.3 %
WBC: 3.6 10*3/uL — ABNORMAL LOW (ref 4.0–10.0)

## 2014-03-02 LAB — EKG 12-LEAD
P: 12 degrees
QRS: 122 degrees
Rate: 62 {beats}/min
Severity: BORDERLINE
Severity: BORDERLINE
Statement: BORDERLINE
T: 6 degrees

## 2014-03-02 LAB — RBC MORPHOLOGY

## 2014-03-02 LAB — APTT
aPTT: >200 s — ABNORMAL HIGH (ref 25.8–37.9)
aPTT: 85.7 s — ABNORMAL HIGH (ref 25.8–37.9)
aPTT: 68.4 s — ABNORMAL HIGH (ref 25.8–37.9)

## 2014-03-02 LAB — HEMOGLOBIN A1C: Hemoglobin A1C: 5.4 % (ref 4.0–6.0)

## 2014-03-02 LAB — MCHC: MCHC: 33 g/dL (ref 32–36)

## 2014-03-02 MED ORDER — METOPROLOL SUCCINATE 25 MG PO TB24 *I*
12.5000 mg | ORAL_TABLET | Freq: Every day | ORAL | Status: DC
Start: 2014-03-03 — End: 2014-03-03
  Administered 2014-03-03: 12.5 mg via ORAL
  Filled 2014-03-02: qty 1

## 2014-03-02 MED ORDER — METOPROLOL SUCCINATE 25 MG PO TB24 *I*
25.0000 mg | ORAL_TABLET | Freq: Every day | ORAL | Status: DC
Start: 2014-03-02 — End: 2014-03-02
  Filled 2014-03-02: qty 1

## 2014-03-02 MED ORDER — SODIUM CHLORIDE 0.9 % IV BOLUS *I*
500.0000 mL | Freq: Once | Status: AC
Start: 2014-03-02 — End: 2014-03-02
  Administered 2014-03-02: 500 mL via INTRAVENOUS

## 2014-03-02 NOTE — Progress Notes (Signed)
Patient's level of care status is inpatient as of 03/01/14.    Susa Day, RN
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
## 2014-03-02 NOTE — Interdisciplinary Rounds (Addendum)
Interdisciplinary Rounds Note    Date: 03/02/2014   Time: 11:14 AM   Attendance:  Cardiac Rehab Nurse, Care Coordinator, Nurse Practitioner and Registered Nurse    Admit Date/Time:  03/01/2014  2:46 PM    Principal Problem: <principal problem not specified>  Problem List:   Patient Active Problem List    Diagnosis Date Noted    STEMI (ST elevation myocardial infarction) 03/01/2014    ST elevation myocardial infarction (STEMI) of inferior wall      s/p TNKase          The patient's problem list and interdisciplinary care plan was reviewed.    Discharge Planning  Lives in: Multi-level home  Location of bedroom: 2nd level  Location of bathroom: 1st level, 2nd level  Lives With: Spouse  Can they assist with pt needs after discharge?: Yes  *Does patient currently have home care services?: No     *Current External Services: None                      Plan: Adm 8/19 with STEMI at OSH s/p lytics, to cath lab. No intervention. Anticoagulate. Lives with spouse in [place], [place]. She is visiting here until Sunday when she and her husband are driving home. Her PCP is Madison Hickman; CCU team to get her a cardiologist and outpatient stress test/echo in [place]. Declined home care. Plan for D/C tomorrow  8/20-Home today    Anticipated Discharge Date:  Expected Discharge Date: 03/04/14  Discharge Disposition: Home

## 2014-03-02 NOTE — Student Note (Signed)
CC: Chest pressure    HPI:   Ms. Celmer is a 73 y/o female with h/o multiple myeloma s/p stem cell transplant who was transferred here from [facility] s/p thrombolytics for STEMI. She was riding her bicycle yesterday when she felt pressure / tightness in her chest. It was nonradiating, right over her sternum, rated as 3/10 in intensity. Once she got home, she became diaphoretic, incontinent of urine, nauseated, and felt the "world fading away" but did not faint or lose consciousness. When asked to describe further what that meant, she attributed the feeling to a drop in BP, as her pressures tend to run soft. This has never happened to her before. She was taken to [facility], where she was diagnosed with inferior STEMI, received thrombolytics and transferred here for further workup. Repeat EKG showed resolution of ST elevations. Underwent LHC, which showed evidence of resolving plaque rupture with some persistent non-flow-limiting thrombus in mid and prox RCA. No stent was placed. Started on heparin drip 12u/kg/hr.    PMH:   Multiple myeloma  Osteopenia / osteoporosis  Multiple fractures  Possible orthostatic hypotension    PSH:  Appendectomy  Caesarian sections x 2  Tubal ligation  Femoral artery repair     Medications:  Home  Prior to Admission medications    Medication Sig Start Date End Date Taking? Authorizing Provider   calcium carbonate-vitamin D 600-400 MG-UNIT per tablet Take 1 tablet by mouth daily   Yes [provider]   Lenalidomide (REVLIMID) 2.5 MG CAPS Take 2.5 mg by mouth daily   Yes [provider]     Scheduled   carvedilol  3.125 mg Oral 2 times per day    aspirin  81 mg Oral Daily    atorvastatin  40 mg Oral Daily    clopidogrel  75 mg Oral Daily       Allergies:   Allergies   Allergen Reactions    Zithromax [Azithromycin] Hives    Ciprofloxacin Nausea Only       FHx:    Mother had a pacemaker placed for an arrhythmia, passed at 78 from  "old age"  Father had unspecified "cardiac issues" and passed in his sleep at 24  Brother has prostate cancer, no cardiac disease    SHx:   Lives in [place], was on vacation in [place] when this occurred. She is very active, rides her bike frequently and used to run and play basketball before her bones became too weak from MM. Endorses a very healthy diet with little processed food, lots of fresh fruit and vegetables. Married for 13 years with 2 daughters and 2 grandchildren. Lives at home with husband and rescue dog.   Tobacco- None  Alcohol- Approximately 1 beer or glass of wine per night, more on social occasions  Drugs- None    ROS:  General: Denies fever, chills. Loss of appetite d/t MM.  HEENT: Denies mouth sores, sore throat, sinus congestion. Endorses blood-tinged saliva when swishing her mouth with water.  Pulm: Denies cough, SOB.  Cardiac: See HPI.  GI: Denies hematochezia or melena, but has not had a BM since before her admission yesterday.  GU: No urinary symptoms of pain. Denies burning or blood in urine.  Vasc: Baseline swelling in LE B/L  Neuro: No changes in vision, dizziness, headache.  Derm: Denies rashes, skin lesions.  Psych: Denies anxiety or depression.    Physical Exam:  Vitals: T 37.1, HR 78, RR 16,  BP 103/62, P 96    General: Pt is a thin, well-appearing female in NAD.  HEENT: PERRL. Dark lesion on L buccal mucosa with possible dried blood. Conjunctivae clear of splinter hemorrhages. Moist mucous membranes.  Lymph: No cervical, postauricular or supraclavicular LAD.  Cardio: +S4. RRR. No m/r/g. No JVD  Resp: Faint bibasilar crackles, L>R. Normal breath sounds.  Abd: Bowel sounds present. No t/r/g.  MSK: Bony mass over L clavicle and sternum consistent with previous fracture history. L small finger ulnar deviation.  Neuro: AAOx3. CN II-XII intact. 5/5 muscle strength in arms and legs. Normal grip strength.  Extremities: Soft, raised lesion palpated over anterior R thigh with slight ecchymosis and  tenderness. 2+ pitting edema in distal LE B/L.  Skin: No rash or petechiae.     I/O:    Intake/Output Summary (Last 24 hours) at 03/02/14 0739  Last data filed at 03/01/14 1556   Gross per 24 hour   Intake      0 ml   Output     10 ml   Net    -10 ml     Data:  Lab Results: from 00:43: Ca 8.2, aPTT >200, WBC 3.6 Hb 8.7, HCT 26, MCV 107, Platelets 122  Cholesterol 157, TG 47, HDL 65, LDL 83    Imaging- Coronary angiography: Evidence of resolving plaque rupture with some persistent thrombus in mid and prox RCA which appears non flow limiting. Plan for heparin ASA and post MI protocol with no intervention at this time. Pt to return to [facility] and will have nuc in 6 weeks.  Echo pending  No events on tele    Assessment:  This is a case of STEMI in a 73 y/o female with h/o multiple myeloma but no cardiac history or significant risk factors s/p thrombolytics with subsequent EKG ST elevation resolution and plaque rupture resolution on coronary angiography with no stent placement.    Plan:  STEMI:  -Pt does not have cardiac history, but she does have family history of cardiac disease including pacemaker in mother and sudden death in father. Does not have risk factors a/w STEMI except for age. Framingham score is 3.3% risk of MI over 10 years. Lenalidomide has been linked to thrombotic events, including clots in arteries and heart attacks. Often pts are put on anticoagulants for this reason, but pt was not on one. Will leave to oncologist's discretion. One large meta-analysis showed calcium supplements without coadminstered vitamin D were a/w increased risk of MI (although pt has been on combination pill), but subsequent meta-analyses have not proven this association.  -Carvedilol 3.125 mg BID. Given her hypotensive tendencies, could consider switching to metoprolol 12.5 mg BID to avoid vasodilatory effects.  -H/o on ACE inhibitor as it is recommended following STEMI in pts w/DM, HF, LVEF<40, or HTN, none of which  qualifies her.  -ASA 81 mg daily.  -Plavix 75 mg daily.  -Atorvastatin 40 mg daily. Could consider high dose at 80, but given LDL of 83, may not be necessary.  -Echo pending, presence of S4 signifies stiffened LV  -aPTT>200. Will recheck aPTT this AM. If still supratherapeutic, adjust heparin gtt per protocol especially in setting of bruising, R thigh hematoma and buccal mucosal bleeding. Has not had BM since before admission, but no frank bleeding when going to bathroom.    Multiple Myeloma:  -3rd week cycle of Revlimid, will resume on d/c.    Anemia:  -Hb 8.6, HCT 26, Platelets 122.  -Does not  currently meet transfusion criteria.    F: NS_0   E: Daily BMP, aPTT, CBC  N: Regular diet  DVT prophylaxis: Heparin gtt    Dispo: Pending normalization of aPTT, d/c back home    Code status: Full    Scarlette Calico, CC3 at 7:39 AM    *This is a Careers information officer note.

## 2014-03-02 NOTE — Progress Notes (Addendum)
Cardiac Care Unit Progress Note:     LOS: 1 day  Full Code    Any Significant 24 Hour Events:     - Pt started on heparin gtt, loading dose maintained for extended duration with pts aPtt rising to >200.  Drip adjusted, follow-up therapeutic.    Subjective:       - Overall feels well.  Notes stye on her L lower eyelid towards medial canthus.  Otherwise no CP, dyspnea, fevers, chills, night sweats, nausea, vomiting.    Objective:      Scheduled Meds:   carvedilol  3.125 mg Oral 2 times per day    aspirin  81 mg Oral Daily    atorvastatin  40 mg Oral Daily    clopidogrel  75 mg Oral Daily     Continuous Infusions:   sodium chloride      heparin 1,000 Units/hr (03/02/14 0355)     PRN Meds:.acetaminophen, heparin     Physical Exam:    Intake/Output Summary (Last 24 hours) at 03/02/14 0746  Last data filed at 03/01/14 1556   Gross per 24 hour   Intake      0 ml   Output     10 ml   Net    -10 ml     Filed Vitals:    03/01/14 2328 03/02/14 0154 03/02/14 0500 03/02/14 0558   BP: 97/42  88/46 103/62   Pulse: 65  76 78   Temp: 37.1 C (98.8 F)      TempSrc: Temporal      Resp:  16     Height:       Weight:       SpO2: 96%        General: WDWN, NAD   HEENT: PERRL, L eye with swelling of lower lid at medial canthus.  No erythema or purulent drainage.  Pulmonary: CTA B/L. No wheezes, rhonchi, or rales.  Normal work of breathing  Cardiovascular: 2+ radial pulses bilaterally.  RRR. No murmurs, rubs, or gallops. No lower extremity edema or JVD  Abdominal: Soft, non-tender, non-distended, normoactive bowel sounds, no rebound of guarding   Neuro: CN II-XII grossly intact.  Moving all 4 extremities spontaneously and to command with at least anti-gravity.  Sensation grossly intact to light tough throughout.  2+ patellar reflexes  Skin: No obvious rashes or lesions.    Labs:    Recent Labs  Lab 03/02/14  0043 03/01/14  2014   WBC 3.6* 3.5*   HEMOGLOBIN 8.7* 8.9*   HEMATOCRIT 26* 27*   PLATELETS 122* 126*       No components found  with this basename: NEUTOPHILPCT, MONOPCT,  EOSPCT    Recent Labs  Lab 03/02/14  0043 03/01/14  2014   SODIUM 140 140   POTASSIUM 4.1 4.2   CHLORIDE 104 105   CO2 25 21       No components found with this basename: BUN, CREATININE, CALCIUM, LABALBU, PROT, BILITOT, ALKPHOS, GLUCOSE    Recent Labs  Lab 03/02/14  0043 03/01/14  2014   APTT >200.0* 195.8*       No components found with this basename: APTT  No components found with this basename: CKTOTAL, TROPONINI, TROPONINT, CKMBINDEX  CHOL/HDL RATIO   Date Value Ref Range Status   03/01/2014 2.4  Final     CHOLESTEROL   Date Value Ref Range Status   03/01/2014 157 mg/dL Final     Comment:     REFERENCE RANGE:  <  200 Desirable                  200-239 Borderline High                    > 240 High       HDL   Date Value Ref Range Status   03/01/2014 65 mg/dL Final     Comment:     REFERENCE RANGE:  < 40 Low                    > 60 High       LDL CALCULATED   Date Value Ref Range Status   03/01/2014 83 mg/dL Final     Comment:     REFERENCE RANGE:  < 100 Optimal                  100-129 Near or above optimal                  130-159 Borderline High                  160-189 High                    > 189 Very High       TRIGLYCERIDES   Date Value Ref Range Status   03/01/2014 47 mg/dL Final     Comment:     REFERENCE RANGE:  < 150 Normal                  150-199 Borderline High                  200-499 High                    > 500 Very High       EKG/Telemetry: NSR over last 24 hours    Radiology: No results found.    Cardiology Studies:  Echo 8/18  pending    Cardiac Cath 8/18  Evidence of resolving plaque rupture with some persistent thrombus in mid and prox RCA which appears non flow limiting. no intervention performed.  - Prox RCA lesion, 60% stenosed.    - Mid RCA to Dist RCA lesion, 50% stenosed. The lesion is segmental and thrombotic.    Assessment:   73 y.o. female with hx MM s/p stem cell transplant presenting from OSH for inferior STEMI s/p thrombolytics with  resolution of her CP and EKG changes. Pt is currently HDS, but supertheraputic on Heparin gtt, without signs of active bleeding.  Heparin gtt has been redosed and we are waiting for her aPtt to fall.    Plans:   STEMI: s/p thrombolytics   - Supertheraputic on heparin gtt with aPtt >200.  Heparin dose adjusted, redrawn Ptt therapeutic.   - switch carvedilol to metoprolol XL 25 PO daily    - start lisinopril when BP allows    - ASA 81 mg every day    - Plavix 24m every day     - Start Atorva 425mevery day   - tele with NSR   - H/H q 12 hrs given low Hct    - ECHO in the AM to evaluate LVEF    Multiple Myeloma: in the middle of her 3 week cycle of Revlimid    - will resume on discharge     Anemia: no signs of active bleeding, baseline Hgb on the  Revlimid is around 10.   - recheck H/H this AM and will stop trending if stable.    F: PO  E: BMP daily  N: low sodium diet   Ppx: DVT: on therapeutic heparin gtt, PUD: none, Bowel: noen  Med Rec: completed   Ancillary Services: none  Problem list updated: yes  Discharge Plan: pending completion of her Echo, will need nuc stress test in 6 weeks    Cassell Clement, MD on 03/02/2014 at 7:46 AM      CCU Attending Addendum  Patient seen and evaluated with CCU team- I agree with the above findings and assessment. Note edited as appropriate. Khrista Braun is a 73 y.o. female with multiple myeloma that presented to [facility] with acute onset chest pain with inferior STE. Reperfusion was achieved with thrombolytics. Corresponding angiogram confirmed resolution with moderate residual disease in the culprit RCA. Will continue medical therapy with IV heparin for another 24 hours. Echocardiogram in AM.  Soft BP to likely limit medical therapy- will trial low dose metoprolol.    Sandria Manly, MD

## 2014-03-03 ENCOUNTER — Encounter: Payer: Self-pay | Admitting: Cardiology

## 2014-03-03 LAB — BASIC METABOLIC PANEL
Anion Gap: 13 (ref 7–16)
CO2: 22 mmol/L (ref 20–28)
Calcium: 7.9 mg/dL — ABNORMAL LOW (ref 8.6–10.2)
Chloride: 107 mmol/L (ref 96–108)
Creatinine: 0.72 mg/dL (ref 0.51–0.95)
GFR,Black: 97 *
GFR,Caucasian: 84 *
Glucose: 87 mg/dL (ref 60–99)
Lab: 13 mg/dL (ref 6–20)
Potassium: 3.8 mmol/L (ref 3.3–5.1)
Sodium: 142 mmol/L (ref 133–145)

## 2014-03-03 LAB — CBC AND DIFFERENTIAL
Baso # K/uL: 0 10*3/uL (ref 0.0–0.1)
Basophil %: 0 %
Eos # K/uL: 0 10*3/uL (ref 0.0–0.4)
Eosinophil %: 0.9 %
Hematocrit: 27 % — ABNORMAL LOW (ref 34–45)
Hemoglobin: 8.9 g/dL — ABNORMAL LOW (ref 11.2–15.7)
Lymph # K/uL: 0.5 10*3/uL — ABNORMAL LOW (ref 1.2–3.7)
Lymphocyte %: 15.5 %
MCH: 35 pg/cell — ABNORMAL HIGH (ref 26–32)
MCHC: 33 g/dL (ref 32–36)
MCV: 108 fL — ABNORMAL HIGH (ref 79–95)
Mono # K/uL: 0.4 10*3/uL (ref 0.2–0.9)
Monocyte %: 12.1 %
Neut # K/uL: 2 10*3/uL (ref 1.6–6.1)
Platelets: 123 10*3/uL — ABNORMAL LOW (ref 160–370)
RBC: 2.5 MIL/uL — ABNORMAL LOW (ref 3.9–5.2)
RDW: 15.8 % — ABNORMAL HIGH (ref 11.7–14.4)
Seg Neut %: 68.9 %
WBC: 2.9 10*3/uL — ABNORMAL LOW (ref 4.0–10.0)

## 2014-03-03 LAB — APTT: aPTT: 72.7 s — ABNORMAL HIGH (ref 25.8–37.9)

## 2014-03-03 LAB — DIFF MANUAL
Bands %: 1 % (ref 0–10)
Diff Based On: 116 CELLS
Misc. Cell %: 0 % (ref 0–0)
React Lymph %: 2 % (ref 0–6)

## 2014-03-03 MED ORDER — ASPIRIN 81 MG PO TBEC *I*
81.0000 mg | DELAYED_RELEASE_TABLET | Freq: Every day | ORAL | Status: AC
Start: 2014-03-03 — End: ?

## 2014-03-03 MED ORDER — METOPROLOL SUCCINATE 25 MG PO TB24 *I*
12.5000 mg | ORAL_TABLET | Freq: Every day | ORAL | Status: AC
Start: 2014-03-03 — End: ?

## 2014-03-03 MED ORDER — ATORVASTATIN CALCIUM 40 MG PO TABS *I*
40.0000 mg | ORAL_TABLET | Freq: Every day | ORAL | Status: AC
Start: 2014-03-03 — End: ?

## 2014-03-03 MED ORDER — CLOPIDOGREL BISULFATE 75 MG PO TABS *I*
75.0000 mg | ORAL_TABLET | Freq: Every day | ORAL | Status: AC
Start: 2014-03-03 — End: ?

## 2014-03-03 MED ORDER — NITROGLYCERIN 0.4 MG SL SUBL *I*
0.4000 mg | SUBLINGUAL_TABLET | SUBLINGUAL | Status: AC | PRN
Start: 2014-03-03 — End: 2014-04-02

## 2014-03-03 NOTE — Progress Notes (Addendum)
Cardiac Care Unit Progress Note:     LOS: 2 days  Full Code    Any Significant 24 Hour Events:     - NAE.  Heparin gtt stopped this AM in anticipation of dischrage    Subjective:       - No acute complaints.  Denies CP, dyspnea, fevers, chills, night sweats, nausea, vomiting.  Eager to discharge today.    Objective:      Scheduled Meds:   metoprolol  12.5 mg Oral Daily    aspirin  81 mg Oral Daily    atorvastatin  40 mg Oral Daily    clopidogrel  75 mg Oral Daily     Continuous Infusions:     PRN Meds:.acetaminophen     Physical Exam:    Intake/Output Summary (Last 24 hours) at 03/03/14 0727  Last data filed at 03/03/14 0459   Gross per 24 hour   Intake 1711.14 ml   Output      0 ml   Net 1711.14 ml     Filed Vitals:    03/02/14 1653 03/02/14 2028 03/02/14 2257 03/03/14 0443   BP: 86/54 95/53 90/58 101/61   Pulse: 68 76 66 68   Temp: 36.6 C (97.9 F) 36.6 C (97.9 F) 36.7 C (98.1 F)    TempSrc: Temporal Temporal Temporal    Resp: _0 Height:       Weight:       SpO2: 96% 95% 96% 96%     General: WDWN, NAD   HEENT: PERRL, L eye with swelling of lower lid at medial canthus.  No erythema or purulent drainage.  Pulmonary: CTA B/L. No wheezes, rhonchi, or rales.  Normal work of breathing  Cardiovascular: 2+ radial pulses bilaterally.  RRR. No murmurs, rubs, or gallops. No lower extremity edema or JVD  Abdominal: Soft, non-tender, non-distended, normoactive bowel sounds, no rebound of guarding   Neuro: CN II-XII grossly intact.  Moving all 4 extremities spontaneously and to command with at least anti-gravity.  Sensation grossly intact to light tough throughout.  2+ patellar reflexes  Skin: No obvious rashes or lesions.    Labs:    Recent Labs  Lab 03/03/14  0453 03/02/14  0752 03/02/14  0043 03/01/14  2014   WBC 2.9*  --  3.6* 3.5*   HEMOGLOBIN 8.9* 9.2* 8.7* 8.9*   HEMATOCRIT 27* 28* 26* 27*   PLATELETS 123*  --  122* 126*       No components found with this basename: NEUTOPHILPCT, MONOPCT,   EOSPCT    Recent Labs  Lab 03/03/14  0453 03/02/14  0043 03/01/14  2014   SODIUM 142 140 140   POTASSIUM 3.8 4.1 4.2   CHLORIDE 107 104 105   CO2 _1 No components found with this basename: BUN, CREATININE, CALCIUM, LABALBU, PROT, BILITOT, ALKPHOS, GLUCOSE    Recent Labs  Lab 03/03/14  0453 03/02/14  1509 03/02/14  0752   APTT 72.7* 68.4* 85.7*       No components found with this basename: APTT  No components found with this basename: CKTOTAL, TROPONINI, TROPONINT, CKMBINDEX  CHOL/HDL RATIO   Date Value Ref Range Status   03/01/2014 2.4  Final     CHOLESTEROL   Date Value Ref Range Status   03/01/2014 157 mg/dL Final     Comment:     REFERENCE RANGE:  < 200 Desirable  200-239 Borderline High                    > 240 High       HDL   Date Value Ref Range Status   03/01/2014 65 mg/dL Final     Comment:     REFERENCE RANGE:  < 40 Low                    > 60 High       LDL CALCULATED   Date Value Ref Range Status   03/01/2014 83 mg/dL Final     Comment:     REFERENCE RANGE:  < 100 Optimal                  100-129 Near or above optimal                  130-159 Borderline High                  160-189 High                    > 189 Very High       TRIGLYCERIDES   Date Value Ref Range Status   03/01/2014 47 mg/dL Final     Comment:     REFERENCE RANGE:  < 150 Normal                  150-199 Borderline High                  200-499 High                    > 500 Very High       EKG/Telemetry: NSR over last 24 hours, transient SVT (possible AF for several seconds this AM) without symptoms    Radiology: No results found.    Cardiology Studies:  Echo 8/19  Normal LVEF without significant regional wall motion abnormalities. Mild left atrial enlargement. No significant valvular abnormalities.    Cardiac Cath 8/18  Evidence of resolving plaque rupture with some persistent thrombus in mid and prox RCA which appears non flow limiting. no intervention performed.  - Prox RCA lesion, 60% stenosed.    - Mid RCA  to Dist RCA lesion, 50% stenosed. The lesion is segmental and thrombotic.    Assessment:   73 y.o. female with hx MM s/p stem cell transplant presenting from OSH for inferior STEMI s/p thrombolytics with resolution of her CP and EKG changes. Pt stable since her cath, heparin gtt stopped this AM, anticipate discharge later today.    Plans:   STEMI: s/p thrombolytics with improvement in EKG, no need for PCI.  Echo with preserved EF.  Will be following up with cardiologist closer to home.  - Heparin gtt stopped this AM in anticipation of discharge.  - Continue metoprolol XL 12.5 PO daily   - Will defer starting lisinopril to outpt cardiologist given soft BPs.    - ASA 81 mg every day   - Plavix 78m every day    - Start Atorva 435mevery day  - tele with NSR  - H/H q 12 hrs given low Hct   - Nuc med stress test in approx 6 weeks.    Multiple Myeloma: in the middle of her 3 week cycle of Revlimid   - d/w hematologist prior to resuming     Anemia: no signs of  active bleeding, baseline Hgb on the Revlimid is around 10 and has been stable while here.  - NTD    F: PO  E: BMP daily  N: low sodium diet   Ppx: DVT: none (discharge today), PUD: none, Bowel: noen  Med Rec: completed   Ancillary Services: none  Problem list updated: yes  Discharge Plan: likely discharge today, will need f/u with outpt cardiologist closer to her and nuc med stress test in 6 weeks    Cassell Clement, MD on 03/03/2014 at 7:27 AM      CCU Attending Addendum  Patient seen and evaluated with CCU team- I agree with the above findings and assessment. Note edited as appropriate. Roberta Simmons is a 73 y.o. female with multiple myeloma that presented to [facility] with acute onset chest pain with inferior STE. Reperfusion was achieved with thrombolytics. Corresponding angiogram confirmed resolution with moderate residual disease in the culprit RCA. Echocardiogram with normal LVEF and she has been asymptomatic since arrival.  Recommend stress test prior to  resuming strenuous activity.  Unclear trigger for STEMI, but suspicious for revlimid as reported in 1-5% of patients receiving therapy and her Framingham score <5% previously.  She will need to discuss risks:benefits with hematology prior to considering restarting. Transient SVT (likely AF) on telemetry today without symptoms.  Will discuss event monitor upon return home.      Discharge medications  Aspirin 81 mg once per day  Plavix 75 mg once per day  Metoprolol XL 12.5 mg daily  Atorvastatin 40 mg once per day    Plans to follow-up with new cardiologist in 1-2 weeks [facility]).  Her daughter is a physician and will help arrange.  Please ensure repeat CBC next week.      Sandria Manly, MD

## 2014-03-03 NOTE — Discharge Summary (Addendum)
Name: Lizvette Lightsey MRN: [id number] DOB: [birth year]     Admit Date: 03/01/2014   Date of Discharge:   03/03/2014      Discharge Attending Physician:   Sandria Manly        Hospitalization Summary    CONCISE NARRATIVE: Masa Lubin is a 73 y.o. female with multiple myeloma that presented to [facility] with acute onset chest pain with inferior STE. Reperfusion was achieved with thrombolytics. Corresponding angiogram confirmed resolution with moderate residual disease in the culprit RCA. Echocardiogram with normal LVEF and she has been asymptomatic since arrival. Recommend stress test prior to resuming strenuous activity. Unclear trigger for STEMI, but suspicious for revlimid as reported in 1-5% of patients receiving therapy and her Framingham score <5% previously. She will need to discuss risks:benefits with hematology prior to considering restarting. Transient SVT (likely AF) on telemetry without symptoms. Will consider event monitor upon follow-up.       CARDIAC TESTING:   LHC 03/01/14  Right Coronary Artery    Prox RCA lesion, 60% stenosed.    Mid RCA to Dist RCA lesion, 50% stenosed. The lesion is segmental and thrombotic.  Evidence of resolving plaque rupture with some persistent thrombus in mid and prox RCA which appears non flow limiting. Plan for heparin ASA and post MI protocol with no intervention at this time. Pt to return to [facility] and will have nuc in 6 weeks.      ULTRASOUND RESULTS:   ECHO 03/02/14  Normal LVEF without significant regional wall motion abnormalities. Mild left atrial enlargement. No significant valvular abnormalities    SIGNIFICANT MED CHANGES: Yes  Aspirin 81 mg once per day  Plavix 75 mg once per day  Metoprolol XL 12.5 mg daily  Atorvastatin 40 mg once per day        Signed: Chinita Greenland, MD  On: 03/03/2014  at: 11:48 AM

## 2014-03-03 NOTE — Progress Notes (Signed)
Discharge instructions reviewed with patient - acknowledged understanding.  Written copy provided to pt along with cath lab discharge instructions.  Reviewed all discharge meds and follow up appts.  Ne medications delivered to pt prior to discharge.

## 2014-03-03 NOTE — Progress Notes (Signed)
Report Given To  Zachary George, RN     Descriptive Sentence / Reason for Admission   -73yo who presented to OSH with CP while riding her bike; found to have elevated Trops and EKG changes. Pt was given lytics at OSH and transferred to [facility] for LHC. No PCI needed d/t lytic therapy she received.  [place].     Active Issues / Relevant Events   -NSR on tele, no c/o pain  -bandaid R radial cath site  CD&I  -Heparin gtt running at 1000 unit/hr-Blood oozing from old L ac IV site and -L forearm where Pt stated that her dog scratched her. Both sites covered with cover sponges and Kling wrap d/t leaking  -Beta blockers held today due to low BP, 500 cc bolus given BP remains the same  - left eye lid- stye- treating with warm compresses QID as ordered  - se doc flow sheet for complete assessment      To Do List  -Monitor tele  -Monitor BPs, 80-90s, asymptomatic  -Monitor bleeding      Anticipatory Guidance / Discharge Planning  HEPARIN GTT to stop in the am 8-20 per CCU resident.;       D/c Thursday

## 2014-03-03 NOTE — Student Note (Signed)
Hospital Medicine Progress Note                                                Significant 24 Hour Events      None.    Subjective      Ms. Cowens is resting comfortably this morning and ready to go home today. No chest pain or other complaints. No BM since her admission 2 days ago.    Objective      Physical Exam  BP 101/61 mmHg   Pulse 68   Temp(Src) 36.7 C (98.1 F) (Temporal)   Resp 18   Ht 1.676 m (5' 5.98")   Wt 55.792 kg (123 lb)   BMI 19.86 kg/m2   SpO2 96%    Recent vital signs reviewed.    General Constitutional: Well-developed, well-nourished female in NAD.  HEENT: PERRL. Erythematous hordeolum stye on left inferior eyelid. Anicteric. No oropharyngeal exudates/lesions. No anterior cervical LAD. Trachea midline.  Cardiovascular: RRR. No murmurs, rubs or gallops. No JVD.  Pulmonary: CTAB. No rales, rhonchi or wheezing.  Gastrointestinal: Soft. NT. ND. Positive bowel sounds.   Extremities: Mild lower extremity edema. Strong DP pulses B/L.  Neurologic: AAOx3. No abnormal movements. Strength grossly intact. CN II-XII intact.  Skin: No rash.    I/O    Intake/Output Summary (Last 24 hours) at 03/03/14 0822  Last data filed at 03/03/14 0459   Gross per 24 hour   Intake 1711.14 ml   Output      0 ml   Net 1711.14 ml       Recent Lab, Micro, and Imaging Studies   Personally reviewed and notable for:    Labs- Ca 7.9, aPTT 72.7, Hb 8.9, HCT 27, PLT 123  Imaging- Echo shows normal LVEF without significant regional wall abnormalities. Mild left atrial enlargement. No significant valvular abnormalities.  Tele shows NSR over last 24 hrs    Assessment     This is a case of STEMI in a 73 y/o female with h/o multiple myeloma but no cardiac history or significant risk factors s/p thrombolytics with subsequent EKG ST elevation resolution and plaque rupture resolution on coronary angiography with no stent placement.    Pt has been stable since thrombolytics, no CP, ready for d/c today.     Plan     STEMI:  -Metoprolol XL  12.5 mg daily.  -H/o on ACE inhibitor as pt's BP runs soft, and it is recommended following STEMI in pts w/DM, HF, LVEF<40, or HTN, none of which qualifies her.  -ASA 81 mg daily.  -Plavix 75 mg daily.  -Atorvastatin 40 mg daily.  -Echo normal  -Heparin drip discontinued.    Multiple Myeloma:  -3rd week cycle of Revlimid, will resume on d/c.    Anemia:  -Hb 8.9, HCT 27, PLT 123  -Does not currently meet transfusion criteria.              F:  PO  E:  Daily BMP, CBC  N:  Low sodium diet  DVT Prophylaxis:   None (d/c today)  Code Status:  Full    Dispo: Likely discharge today, f/u with cardiologist at home in [facility] and nuclear stress test in 6 weeks      Scarlette Calico on 03/03/2014 at [place], CC3    *This is a Careers information officer  note.

## 2014-03-03 NOTE — Discharge Instructions (Signed)
Admission Diagnosis  Chest Pain    Discharge Diagnosis  ST elevation myocardial infarction (STEMI) of inferior wall       Brief Summary of Your Hospital Course:  73 y.o.female admitted after presenting with chest pain, ruled in for myocardial infarction; given lytic therapy, underwent cardiac catheterization and no intervention was required.  Medical management was optimized.    Diagnosis  Principal Problem:    ST elevation myocardial infarction (STEMI) of inferior wall  Active Problems:    STEMI (ST elevation myocardial infarction)      Procedures performed during this hospitalization:  Coronary Angiogram    Recommended diet: Low sodium, low cholesterol, low fat      Recommended activity:  Walk and use stairs as tolerated.  Do not lift anything >5-10lbs x 2-3 weeks. No driving x 1 week.  Do not return to work until cleared by MD.     Wound Care:  See pre-written guidelines after cardiac cath lab procedure     Pain Medication: pre-written nitroglycerine guidelines    Follow Up: You should follow up with your PCP in 3-5 days following your hospitalization. Follow up with a cardiologist in 1-2 weeks. You should have a stress echocardiogram in 6 weeks. Discuss with your oncologist whether or not to continue Revlimid.      If you experience any of these symptoms chest pain, shortness of breath, Fever (temberature greater than of equal to 101.4, redness or drainage from your incision site, contact your physician immediately.  For concerns specific to this hospitalization, especially within the 1st 24 hours of hospital discharge, call [phone] and ask to speak with Dr. Sydell Axon.    Patient is encouraged to participate in cardiac rehab program after seen by cardiologist.           CMS Indicators       ACE inhibitor -consider as an outpatient, BP soft   ARB -as above  Beta blocker - metoprolol  Statin - atorvastatin  Aspirin - yes  Plavix- n/a  Smoking cessation - n/a    Smoking  Smoking can increase your chances of  developing chronic health problems or worsen conditions you already have.  If you smoke you should quit. Smoking cessation information has been given to you for your review to help you quit.  Medications to help you quit are available.  Ask your doctor if you would like to receive these medications.

## 2014-03-09 ENCOUNTER — Telehealth: Payer: Self-pay

## 2014-03-09 ENCOUNTER — Ambulatory Visit: Payer: Medicare Other | Admitting: Cardiovascular Disease

## 2014-03-09 NOTE — Telephone Encounter (Signed)
Faxed pt medical records to [facility] [phone] °

## 2014-03-10 ENCOUNTER — Ambulatory Visit (INDEPENDENT_AMBULATORY_CARE_PROVIDER_SITE_OTHER): Payer: Medicare Other | Admitting: Family Medicine

## 2014-03-10 ENCOUNTER — Encounter: Payer: Self-pay | Admitting: Family Medicine

## 2014-03-10 VITALS — BP 129/82 | HR 72 | Ht 66.5 in | Wt 121.0 lb

## 2014-03-10 DIAGNOSIS — I219 Acute myocardial infarction, unspecified: Secondary | ICD-10-CM | POA: Insufficient documentation

## 2014-03-10 DIAGNOSIS — I252 Old myocardial infarction: Secondary | ICD-10-CM | POA: Insufficient documentation

## 2014-03-10 NOTE — Assessment & Plan Note (Signed)
Pt doing well s/p MI.  Her Echo showed no LV dysfunction, she does not have DM or CKD, so would favor holding off on ACE since her pressure is well controlled.  As well, she has significant ecchymosis on exam and would consider shorter course (14-30 days of Plavix vs longer course (1 yr) in setting of STEMI w/o stent placement).  Continue ASA, Lipitor, Metoprolol and f/u with Dr. Johnsie Cancel tomorrow, cardiology.  Will need repeat Echo in about 4 weeks and appreciate cardiology recommendations.

## 2014-03-10 NOTE — Progress Notes (Signed)
Shannon Rush is a 73 y.o. female who presents today for hospital f/u for MI in [place] in the middle of August (March 01 2014)   STEMI - Pt seen in [place] for STEMI of inferiorposterior wall that was given fibrinolytics and documented 50-60% occlusion x 2 in the RCA during PCI.  No stent was placed and she was loaded with heparin gtt and 600 mg Plavix.  She was continued on Lipitor 40 mg daily, Metoprolol XL 25 mg daily, plavix 75 mg daily, ASA 81 mg daily and lisinopril was held due to low BP.  She has been doing great since d/c and return to [employer], walking about 1/2-1 mile daily and riding her bike moderately.  Denies any further chest pain but is having some occasional ecchymosis on her arms 2/2 new anticoagulants.    Past Medical History  Diagnosis Date  . Multiple myeloma 07/01/2012  . Unspecified deficiency anemia   . OSTEOPOROSIS 06/11/2010    Multiple compression fractures; and spontaneous fracture of sternum Qualifier: Diagnosis of  By: [name], [name]     . 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 Cord compression 07/13/12    MRI- diffuse myeloma involvement of T-L spine  . History of radiation therapy 07/13/12-07/27/12    spinal cord compression T3-T10,left scapula    History  Smoking status  . Never Smoker   Smokeless tobacco  . Never Used    Family History  Problem Relation Age of Onset  . Glaucoma Mother   . Heart disease Mother   . Heart disease Father   . Cancer Brother     stage IV prostate CA    Current Outpatient Prescriptions on File Prior to Visit  Medication Sig Dispense Refill  . calcium carbonate (OS-CAL) 600 MG TABS tablet Take 600 mg by mouth daily with breakfast.       . lenalidomide (REVLIMID) 2.5 MG capsule Take 1 tablet (2.5 mg daily) for 21 days ,  and then off for 7 days.  21 capsule  0  . morphine (MS CONTIN) 15 MG 12 hr tablet Take by mouth. Take 15 mg by mouth every 12 (twelve) hours. Using on as needed basis.      Marland Kitchen  morphine (MSIR) 15 MG tablet Take 1 tablet (15 mg total) by mouth every 6 (six) hours as needed for severe pain.  30 tablet  0  . ondansetron (ZOFRAN) 4 MG tablet Take 4 mg by mouth every 8 (eight) hours as needed for nausea or vomiting.       No current facility-administered medications on file prior to visit.    ROS: Per HPI.  All other systems reviewed and are negative.   Physical Exam Filed Vitals:   03/10/14 1445  BP: 129/82  Pulse: 72    Physical Examination: General appearance - alert, well appearing, and in no distress Neck - No JVD Chest - clear to auscultation, no wheezes, rales or rhonchi, symmetric air entry Heart - normal rate and regular rhythm, no murmurs noted Skin : Scattered ecchymosis over arms    Chemistry      Component Value Date/Time   NA 142 01/25/2014 1508   NA 133* 05/08/2012 0922   K 3.8 01/25/2014 1508   K 4.2 05/08/2012 0922   CL 106 11/26/2012 0936   CL 100 05/08/2012 0922   CO2 26 01/25/2014 1508   CO2 27 05/08/2012 0922   BUN 14.9 01/25/2014 1508   BUN  20 05/08/2012 0922   CREATININE 0.8 01/25/2014 1508   CREATININE 0.67 07/13/2012 1310   CREATININE 0.88 05/08/2012 0922   CREATININE 0.55 04/01/2011 0250      Component Value Date/Time   CALCIUM 9.3 01/25/2014 1508   CALCIUM 9.4 05/08/2012 0922   ALKPHOS 63 01/25/2014 1508   ALKPHOS 77 05/08/2012 0922   AST 22 01/25/2014 1508   AST 21 05/08/2012 0922   ALT 15 01/25/2014 1508   ALT 15 05/08/2012 0922   BILITOT 0.57 01/25/2014 1508   BILITOT 0.6 05/08/2012 0922      Lab Results  Component Value Date   WBC 2.7* 01/25/2014   HGB 11.2* 01/25/2014   HCT 33.7* 01/25/2014   MCV 104.8* 01/25/2014   PLT 206 01/25/2014

## 2014-03-11 ENCOUNTER — Other Ambulatory Visit (HOSPITAL_BASED_OUTPATIENT_CLINIC_OR_DEPARTMENT_OTHER): Payer: Medicare Other

## 2014-03-11 ENCOUNTER — Encounter: Payer: Self-pay | Admitting: Internal Medicine

## 2014-03-11 ENCOUNTER — Ambulatory Visit (INDEPENDENT_AMBULATORY_CARE_PROVIDER_SITE_OTHER): Payer: Medicare Other | Admitting: Physician Assistant

## 2014-03-11 ENCOUNTER — Telehealth: Payer: Self-pay | Admitting: Internal Medicine

## 2014-03-11 ENCOUNTER — Ambulatory Visit (HOSPITAL_BASED_OUTPATIENT_CLINIC_OR_DEPARTMENT_OTHER): Payer: Medicare Other

## 2014-03-11 ENCOUNTER — Encounter: Payer: Self-pay | Admitting: Physician Assistant

## 2014-03-11 ENCOUNTER — Ambulatory Visit (HOSPITAL_BASED_OUTPATIENT_CLINIC_OR_DEPARTMENT_OTHER): Payer: Medicare Other | Admitting: Internal Medicine

## 2014-03-11 VITALS — BP 119/62 | HR 76 | Temp 98.2°F | Resp 17 | Ht 66.0 in | Wt 122.9 lb

## 2014-03-11 VITALS — BP 115/70 | HR 70 | Ht 66.0 in | Wt 122.0 lb

## 2014-03-11 DIAGNOSIS — I252 Old myocardial infarction: Secondary | ICD-10-CM

## 2014-03-11 DIAGNOSIS — I2510 Atherosclerotic heart disease of native coronary artery without angina pectoris: Secondary | ICD-10-CM

## 2014-03-11 DIAGNOSIS — C9000 Multiple myeloma not having achieved remission: Secondary | ICD-10-CM

## 2014-03-11 DIAGNOSIS — T451X5A Adverse effect of antineoplastic and immunosuppressive drugs, initial encounter: Secondary | ICD-10-CM

## 2014-03-11 DIAGNOSIS — M810 Age-related osteoporosis without current pathological fracture: Secondary | ICD-10-CM

## 2014-03-11 DIAGNOSIS — D61810 Antineoplastic chemotherapy induced pancytopenia: Secondary | ICD-10-CM

## 2014-03-11 MED ORDER — SODIUM CHLORIDE 0.9 % IV SOLN
Freq: Once | INTRAVENOUS | Status: AC
  Administered 2014-03-11: 17:00:00 via INTRAVENOUS

## 2014-03-11 MED ORDER — SODIUM CHLORIDE 0.9 % IV SOLN
3.5000 mg | Freq: Once | INTRAVENOUS | Status: AC
  Administered 2014-03-11: 3.5 mg via INTRAVENOUS
  Filled 2014-03-11: qty 4.38

## 2014-03-11 NOTE — Patient Instructions (Signed)
Your physician recommends that you return for lab work in: [place] 3-4 WEEKS  Your physician recommends that you schedule a follow-up appointment in: [place]  Your physician recommends that you continue on your current medications as directed. Please refer to the Current Medication list given to you today.

## 2014-03-11 NOTE — Progress Notes (Signed)
Cardiology Office Note    Date:  03/11/2014   ID:  Shannon Rush, DOB 1940-11-23, MRN [id number]  PCP:  [name], MD  Cardiologist:  Dr. Jenkins Rouge      History of Present Illness: Shannon Rush is a 73 y.o. female with a history of multiple myeloma s/p bone marrow transplant (followed at [facility] by Dr. [name] and in [place] by Dr. Juliann Mule), osteoporosis.  Patient was seen by Dr. Johnsie Cancel in 12/2012. There were reports of an abnormal echocardiogram. 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  Admitted 8/18-8/22 to the [facility] with an inferior STEMI. She received thrombolytics. Cardiac catheterization demonstrated 60% proximal RCA, mid to distal RCA 50% (lesion segmental and thrombotic).  Echocardiogram demonstrated normal LV function. Patient was continued on heparin and aspirin. No intervention was performed. Recommendation was for the patient to return to [facility] and have a nuclear stress test in 6 weeks.  Patient was on Revlimid (for pancytopenia from chemotherapy) and the DC summary from the hospital in [place] cites this as a possible cause for her STEMI.  She returns for FU.  She is doing well. She denies chest pain. She is an avid bike rider. She has been back to cycling without chest discomfort or significant shortness of breath. She denies syncope. Denies orthopnea, PND or edema. She denies significant palpitations.  Studies:  - LHC (8/15- [place]):  pRCA 60%, mid to dist RCA 50% >>> med rx  - Echo (03/02/14-done at [facility]):  Normal LVEF without significant regional wall motion abnormalities. Mild  left atrial enlargement. No significant valvular abnormalities.    Recent Labs/Images: 01/25/2014: ALT 15; Creatinine 0.8; Hemoglobin 11.2*; Potassium 3.8    Wt Readings from Last 3  Encounters:  03/11/14 122 lb (55.339 kg)  03/10/14 121 lb (54.885 kg)  01/25/14 122 lb 3.2 oz (55.43 kg)     Past Medical History  Diagnosis Date  . Multiple myeloma 07/01/2012  . Unspecified deficiency anemia   . OSTEOPOROSIS 06/11/2010    Multiple compression fractures; and spontaneous fracture of sternum Qualifier: Diagnosis of  By: [name], [name]     . 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 Cord compression 07/13/12    MRI- diffuse myeloma involvement of T-L spine  . History of radiation therapy 07/13/12-07/27/12    spinal cord compression T3-T10,left scapula    Current Outpatient Prescriptions  Medication Sig Dispense Refill  . aspirin 81 MG EC tablet Take 81 mg by mouth daily.      Marland Kitchen atorvastatin (LIPITOR) 40 MG tablet Take 40 mg by mouth daily.      . calcium carbonate (TUMS - DOSED IN MG ELEMENTAL CALCIUM) 500 MG chewable tablet Chew 1 tablet by mouth daily.      . clopidogrel (PLAVIX) 75 MG tablet Take 75 mg by mouth daily.      Marland Kitchen lenalidomide (REVLIMID) 2.5 MG capsule Take 1 tablet (2.5 mg daily) for 21 days ,  and then off for 7 days.  21 capsule  0  . metoprolol succinate (TOPROL-XL) 25 MG 24 hr tablet Take 25 mg by mouth daily.      Marland Kitchen morphine (MS CONTIN) 15 MG 12 hr tablet Take by mouth. Take 15 mg by mouth every 12 (twelve) hours. Using on as needed basis.      Marland Kitchen  morphine (MSIR) 15 MG tablet Take 1 tablet (15 mg total) by mouth every 6 (six) hours as needed for severe pain.  30 tablet  0  . ondansetron (ZOFRAN) 4 MG tablet Take 4 mg by mouth every 8 (eight) hours as needed for nausea or vomiting.       No current facility-administered medications for this visit.     Allergies:   Zithromax and Ciprofloxacin   Social History:  The patient  reports that she has never smoked. She has never used smokeless tobacco. She reports that she drinks about 3.5 ounces of alcohol per week. She reports that she does not use illicit drugs.   Family History:  The patient's  family history includes Cancer in her brother; Glaucoma in her mother; Heart disease in her father and mother.   ROS:  Please see the history of present illness.      All other systems reviewed and negative.   PHYSICAL EXAM: VS:  BP 115/70  Pulse 70  Ht 5' 6"  (1.676 m)  Wt 122 lb (55.339 kg)  BMI 19.70 kg/m2 Well nourished, well developed, in no acute distress HEENT: normal Neck: no JVD Cardiac:  normal S1, S2; RRR; no murmur Lungs:  clear to auscultation bilaterally, no wheezing, rhonchi or rales Abd: soft, nontender, no hepatomegaly Ext: no edemaright wrist without hematoma or mass  Skin: warm and dry Neuro:  CNs 2-12 intact, no focal abnormalities noted  EKG:  NSR, HR 70,  rightward axis, inferolateral T wave inversions     ASSESSMENT AND PLAN:  Coronary Artery Disease:  She is doing well after recent inferior STEMI treated with thrombolytics in [place].  As noted, cardiac catheterization demonstrated moderate nonobstructive CAD in the RCA. This was treated medically. I reviewed her case today with Dr. Ron Parker (DOD). The cardiologist in [place] suggested proceeding with a nuclear study after 6 weeks. The patient is quite active without anginal symptoms. At this point, we do not think it is necessary to proceed with nuclear stress testing. The duration of Plavix plus aspirin as not clear at this time as she did not have PCI. After review with Dr. Ron Parker, we decided to keep her on dual antiplatelet therapy for the near future. She can follow up with Dr. Johnsie Cancel in the next month. He can decide further when she should stop her Plavix. She will remain on beta blocker, aspirin, Plavix and statin. Repeat lipids and LFTs will be obtained in the next month.  She prefers to do cardiac rehabilitation on her own.  Multiple myeloma:  Follow up with oncology as planned.   Disposition:  FU with Dr. Johnsie Cancel in one month.   Signed, Versie Starks, MHS 03/11/2014 11:41 AM    [facility] [address], [place], [place]  [address] Phone: [phone]; Fax: [phone]

## 2014-03-11 NOTE — Progress Notes (Signed)
[place], MD [place] [address]  DIAGNOSIS: Multiple myeloma - Plan: CBC with Differential, Comprehensive metabolic panel (Cmet) - [facility], Lactate dehydrogenase (LDH) - [facility], IgG, IgA, IgM, Kappa/lambda light chains  Chief Complaint  Patient presents with  . Multiple Myeloma    CURRENT TREATMENT:  Zometa 3.5 mg monthly started on 06/2013;  Revlimid 5 mg daily for 21 days then off for 7 days for maintenance for at least 2 years started on 09/02/2013.  Decreased to revlimide 2.5 mg daily for 21 days then off for 7 days on 12/30/2013.     Multiple myeloma   05/26/2012 Initial Diagnosis Presenting IgG was 4,040 mg/dL on 06/05/2012 (IgA 35; IgM 34); kappa free light chain 34.4 mg/dL, lambda 0.00, kappa:lambda ratio of 34.75; SPEP with M-spike of 2.60.   06/30/2012 Imaging Numerous lytic lesions throughout the calvarium.  Lytic lesion within the left lateral scapula.  Findings most compatible with metastases or myeloma. Multiple mid and lower thoracic compression fractures.  Slight compression through the endplates at L3.   68/34/1962 Bone Marrow Biopsy Bone marrow biopsy showed 49% plasma cell. Normal classical cytogenetics; however, myeloma FISH panel showed 13q- (intermediate risk)       07/14/2012 - 07/27/2012 Radiation Therapy Palliative radiation 20 Gy over 10 fractions between to thoracic spine cord compression and symptomatic left scapula lytic lesion.   08/10/2012 -  Chemotherapy Started SQ Velcade once weekly, 3 weeks on, 1 weeks off; daily Revlimid d1-21, 7 days off; and Dexamethasone 110m PO weekly.    02[facility]12/2013 [facility]    06/14/2013 Tumor Marker IgG  1830,  Kappa:lamba ratio, 1.69 (baseline was 34.75 or   M-spike 0.28 (baseline of 2.6 or  89.3% of baseline).     06/25/2013 -  Chemotherapy Started zometa 3.5 mg monthly    09/01/2013 -  Chemotherapy  Maintenance therapy with revlimid 529mdaily for 14 days and then off for 7 (decreased from 21 days on and 7 days off based on neutropenia).    12/20/2013 Treatment Plan Change Maintenance therapy decreased to 2.5 mg daily for 21 days and then off for 7 days based on low counts.    INTERVAL HISTORY: Shannon RIEKE73.o. female with a past medical history of MM as detailed above, s/p SCT is here for follow-up. She remains on revlimid 2.5 mg for 21 days on and 7 days off.    [facility] She received thrombolytics. Cardiac catheterization demonstrated 60% proximal RCA, mid to distal RCA 50% (lesion segmental and thrombotic). Echocardiogram demonstrated normal LV function. Patient was continued on heparin and aspirin. No intervention was performed. [facility]  She was seen on today with Dr. NiKyla Balzarineffice.  They will continue plavix plus aspirin with a one-month follow. She is compliant with lipitor and metoprolol.  Echo will be repeated in about one month.  She was seen by Dr. GaSamule Ohmith labs prior to this visit.  Per patient, she was instructed to continue her revlimid at the present dose. She is back to riding her bike and denies any chest discomfort.    MEDICAL HISTORY: Past Medical History  Diagnosis Date  . Multiple myeloma 07/01/2012  . Unspecified deficiency anemia   . OSTEOPOROSIS 06/11/2010  Multiple compression fractures; and spontaneous fracture of sternum Qualifier: Diagnosis of  By: [name], [name]     . 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 Cord compression 07/13/12    MRI- diffuse myeloma involvement of T-L spine  . History of radiation therapy 07/13/12-07/27/12    spinal cord compression T3-T10,left scapula  . CAD (coronary artery disease)     a. inf STEMI (in [place] >>> lytics) >>> LHC  (8/15- [place]):  pRCA 60%, mid to dist RCA 50% >>> med rx  . Hx of echocardiogram     Echo (03/02/14-done at [facility]):  Normal LVEF without significant regional wall motion abnormalities. Mild     INTERIM HISTORY: has Anemia, macrocytic; DJD, UNSPECIFIED; OSTEOPOROSIS; Sciatica of left side; Multiple myeloma; Swelling, mass, or lump in chest; History of peripheral stem cell transplant; Unspecified vitamin D deficiency; History of organ or tissue transplant; History of ST elevation myocardial infarction (STEMI); and Coronary atherosclerosis of native coronary artery on her problem list.    ALLERGIES:  is allergic to zithromax and ciprofloxacin.  MEDICATIONS: has a current medication list which includes the following prescription(s): aspirin, atorvastatin, calcium carbonate, clopidogrel, lenalidomide, metoprolol succinate, morphine, morphine, and ondansetron.  SURGICAL HISTORY:  Past Surgical History  Procedure Laterality Date  . Appendectomy    . Tubal ligation    . Cesarean section      x2   . Hip surgery  2009    left  . Elbow surgery    . Colonoscopy  2007    neg with Dr. [name]    REVIEW OF SYSTEMS:   Constitutional: Denies abnormal weight loss Eyes: Denies blurriness of vision Ears, nose, mouth, throat, and face: Denies mucositis or sore throat Respiratory: Denies cough, dyspnea or wheezes Cardiovascular: Denies palpitation, chest discomfort or lower extremity swelling Gastrointestinal:  Denies nausea, heartburn or change in bowel habits Skin: Denies abnormal skin rashes Lymphatics: Denies new lymphadenopathy or easy bruising Neurological:Denies numbness, tingling or new weaknesses Behavioral/Psych: Mood is stable, no new changes  All other systems were reviewed with the patient and are negative.  PHYSICAL EXAMINATION: ECOG PERFORMANCE STATUS: 0 - Asymptomatic  Blood pressure 119/62, pulse 76, temperature 98.2 F (36.8 C),  temperature source Oral, resp. rate 17, height 5' 6"  (1.676 m), weight 122 lb 14.4 oz (55.747 kg), SpO2 97.00%.  GENERAL:alert, no distress and comfortable; easily mobile to exam table; kyphosis, moderate.  SKIN: skin color, texture, turgor are normal, no rashes or significant lesions; Abrasion well healed on RLE.  EYES: normal, Conjunctiva are pink and non-injected, sclera clear OROPHARYNX:no exudate, no erythema and lips, buccal mucosa, and tongue normal  NECK: supple, thyroid normal size, non-tender, without nodularity; nodules cervical bilaterally.  LYMPH:  no palpable lymphadenopathy in the cervical, axillary or supraclavicular LUNGS: crackles at the bases bilaterally with normal breathing effort, no wheezes or rhonchi HEART: regular rate & rhythm and no murmurs and no lower extremity edema ABDOMEN:abdomen soft, non-tender and normal bowel sounds Musculoskeletal:no cyanosis of digits and no clubbing  NEURO: alert & oriented x 3 with fluent speech, no focal motor/sensory deficits  Labs:  Lab Results  Component Value Date   WBC 2.7* 01/25/2014   HGB 11.2* 01/25/2014   HCT 33.7* 01/25/2014   MCV 104.8* 01/25/2014   PLT 206 01/25/2014   NEUTROABS 1.5 01/25/2014      Chemistry      Component Value Date/Time   NA 142 01/25/2014 1508  NA 133* 05/08/2012 0922   K 3.8 01/25/2014 1508   K 4.2 05/08/2012 0922   CL 106 11/26/2012 0936   CL 100 05/08/2012 0922   CO2 26 01/25/2014 1508   CO2 27 05/08/2012 0922   BUN 14.9 01/25/2014 1508   BUN 20 05/08/2012 0922   CREATININE 0.8 01/25/2014 1508   CREATININE 0.67 07/13/2012 1310   CREATININE 0.88 05/08/2012 0922   CREATININE 0.55 04/01/2011 0250      Component Value Date/Time   CALCIUM 9.3 01/25/2014 1508   CALCIUM 9.4 05/08/2012 0922   ALKPHOS 63 01/25/2014 1508   ALKPHOS 77 05/08/2012 0922   AST 22 01/25/2014 1508   AST 21 05/08/2012 0922   ALT 15 01/25/2014 1508   ALT 15 05/08/2012 0922   BILITOT 0.57 01/25/2014 1508   BILITOT 0.6  05/08/2012 2423     RADIOGRAPHIC STUDIES: None  ASSESSMENT: Shannon Rush 73 y.o. female with a history of Multiple myeloma - Plan: CBC with Differential, Comprehensive metabolic panel (Cmet) - [facility], Lactate dehydrogenase (LDH) - [facility], IgG, IgA, IgM, Kappa/lambda light chains   PLAN:    1. Pancytopenia due to chemotherapy. -- We will continue her dose of revlimid 2.5 mg daily for 3 weeks on and then one week off.  [facility] W[facility] at [facility] 2.7 on 03/09/2014. .   2. IgG kappa Multiple myeloma s/p Auto SCT, intermediate risk.  --MM markers demonstrate a very good response.     - Acyclovir 448m PO daily to prevent risk of viral reactivation for one year post transplant.  -- She started revlimid 5 mg daily for maintenance on 09/02/2013.   Decreased to 2.5 daily for 3 weeks on and then one week off on 12/20/2013.   -- We restarted zometa 3.5 mg q 4 weeks on 06/25/2013. She will receive an infusion today and plan for one year (monthly) then every 3 months.  --Now 11 months from SCT. She is also following closely with [facility].  [facility] a [facility] and calcium of 8.8.  Albumin of 4.   3. Osteoporosis.  -- [facility]. She will continue calcium, vitamin D and weight bearing exercises. She will continue zometa as noted above.    4. Follow-up.  She will [place] with [place] for a symptom visit. Repeat labs in 4 weeks to assess trend given recent decrease while on revlimid. Zometa next visit.   All questions were answered. The patient knows to call the clinic with any problems, questions or concerns. We can certainly see the patient much sooner if necessary.  I spent 15 minutes counseling the patient face to face. The total time spent in the appointment was 25 minutes.    Markee Remlinger, MD 03/11/2014 4:17 PM

## 2014-03-11 NOTE — Telephone Encounter (Signed)
Pt confirmed labs/ov per 08/28 POF, sent msg to add Zometa, gave pt AVS..Marland KitchenKJ

## 2014-03-11 NOTE — Patient Instructions (Signed)

## 2014-03-14 ENCOUNTER — Other Ambulatory Visit: Payer: Self-pay | Admitting: *Deleted

## 2014-03-14 ENCOUNTER — Other Ambulatory Visit: Payer: Self-pay | Admitting: Hematology

## 2014-03-14 ENCOUNTER — Telehealth: Payer: Self-pay | Admitting: *Deleted

## 2014-03-14 DIAGNOSIS — C9000 Multiple myeloma not having achieved remission: Secondary | ICD-10-CM

## 2014-03-14 MED ORDER — LENALIDOMIDE 2.5 MG PO CAPS: ORAL_CAPSULE | ORAL | Status: DC

## 2014-03-14 NOTE — Addendum Note (Signed)
Addended by: Wyonia Hough on: 03/14/2014 05:02 PM   Modules accepted: Orders

## 2014-03-14 NOTE — Telephone Encounter (Signed)
THIS REFILL REQUEST FOR REVLIMID WAS GIVEN TO DR.SEHBAI'S NURSE, MYRTLE HARDIN,RN.

## 2014-03-14 NOTE — Telephone Encounter (Signed)
Per staff message and POF I have scheduled appts. Advised scheduler of appts. JMW  

## 2014-03-15 ENCOUNTER — Telehealth (HOSPITAL_COMMUNITY): Payer: Self-pay | Admitting: Cardiac Rehabilitation

## 2014-03-15 NOTE — Telephone Encounter (Signed)
pc to pt to enroll in cardiac rehab.  Pt states she was very active prior to her STEMI.  She prefers to exercise on her own however she is considering for risk factor modification. Pt has $50 copay.

## 2014-03-24 ENCOUNTER — Telehealth: Payer: Self-pay | Admitting: Internal Medicine

## 2014-03-24 NOTE — Telephone Encounter (Signed)
returned pt call and lvm conf appt per pt request °

## 2014-03-25 ENCOUNTER — Encounter: Payer: Medicare Other | Admitting: Physician Assistant

## 2014-03-25 ENCOUNTER — Telehealth: Payer: Self-pay | Admitting: Hematology

## 2014-03-25 NOTE — Telephone Encounter (Signed)
returned pt call adn s.w pt husband he will have pt call back to r/s

## 2014-04-04 ENCOUNTER — Other Ambulatory Visit: Payer: Self-pay

## 2014-04-04 ENCOUNTER — Telehealth: Payer: Self-pay | Admitting: Hematology

## 2014-04-04 MED ORDER — CLOPIDOGREL BISULFATE 75 MG PO TABS: 75.0000 mg | ORAL_TABLET | Freq: Every day | ORAL | Status: DC

## 2014-04-04 MED ORDER — ATORVASTATIN CALCIUM 40 MG PO TABS: 40.0000 mg | ORAL_TABLET | Freq: Every day | ORAL | Status: DC

## 2014-04-04 NOTE — Telephone Encounter (Signed)
PT CALLED TO R/S APPT....DONE....PT AWARE OF NEW D.T °

## 2014-04-06 ENCOUNTER — Other Ambulatory Visit: Payer: Self-pay | Admitting: *Deleted

## 2014-04-06 ENCOUNTER — Telehealth: Payer: Self-pay | Admitting: Cardiovascular Disease

## 2014-04-06 ENCOUNTER — Telehealth: Payer: Self-pay | Admitting: Internal Medicine

## 2014-04-06 NOTE — Telephone Encounter (Signed)
Pt called because she has an appointment for labs at the cancer center in [place] on 9/28 th at 8:00 Am she would like to have the Lipids and LFT done there instead of waiting till 04/14/14 scheduled do be done in this office. Pt gave me a phone # to call [phone] they transfered the call to another phone; phone ringed several times and no one answer. Pt aware and states that she will go to have the labs on the 28 th and will ask to have the blood work done there.

## 2014-04-06 NOTE — Telephone Encounter (Signed)
New message    Patient wants to have lab drawn at [facility] cancer center. The office is asking for order to be sent over.     Phone # [phone] . Fax # [phone].

## 2014-04-06 NOTE — Telephone Encounter (Signed)
returned pt call and r/s appt per pt request....pt ok and aware of time

## 2014-04-08 ENCOUNTER — Ambulatory Visit: Payer: Medicare Other

## 2014-04-08 ENCOUNTER — Other Ambulatory Visit: Payer: Medicare Other

## 2014-04-11 ENCOUNTER — Encounter: Payer: Self-pay | Admitting: Hematology

## 2014-04-11 ENCOUNTER — Other Ambulatory Visit (HOSPITAL_BASED_OUTPATIENT_CLINIC_OR_DEPARTMENT_OTHER): Payer: Medicare Other

## 2014-04-11 ENCOUNTER — Other Ambulatory Visit: Payer: Medicare Other

## 2014-04-11 ENCOUNTER — Ambulatory Visit (HOSPITAL_BASED_OUTPATIENT_CLINIC_OR_DEPARTMENT_OTHER): Payer: Medicare Other

## 2014-04-11 ENCOUNTER — Ambulatory Visit (HOSPITAL_BASED_OUTPATIENT_CLINIC_OR_DEPARTMENT_OTHER): Payer: Medicare Other | Admitting: Hematology

## 2014-04-11 VITALS — BP 114/64 | HR 80 | Temp 98.2°F | Resp 17 | Ht 66.0 in | Wt 127.9 lb

## 2014-04-11 DIAGNOSIS — M810 Age-related osteoporosis without current pathological fracture: Secondary | ICD-10-CM | POA: Diagnosis not present

## 2014-04-11 DIAGNOSIS — C9000 Multiple myeloma not having achieved remission: Secondary | ICD-10-CM

## 2014-04-11 DIAGNOSIS — T451X5A Adverse effect of antineoplastic and immunosuppressive drugs, initial encounter: Secondary | ICD-10-CM | POA: Diagnosis not present

## 2014-04-11 DIAGNOSIS — Z23 Encounter for immunization: Secondary | ICD-10-CM | POA: Diagnosis not present

## 2014-04-11 DIAGNOSIS — D61810 Antineoplastic chemotherapy induced pancytopenia: Secondary | ICD-10-CM

## 2014-04-11 LAB — CBC WITH DIFFERENTIAL/PLATELET
BASO%: 2 % (ref 0.0–2.0)
Basophils Absolute: 0 10*3/uL (ref 0.0–0.1)
EOS%: 8.1 % — ABNORMAL HIGH (ref 0.0–7.0)
Eosinophils Absolute: 0.2 10*3/uL (ref 0.0–0.5)
HCT: 34.1 % — ABNORMAL LOW (ref 34.8–46.6)
HGB: 11.2 g/dL — ABNORMAL LOW (ref 11.6–15.9)
LYMPH%: 25.6 % (ref 14.0–49.7)
MCH: 35 pg — ABNORMAL HIGH (ref 25.1–34.0)
MCHC: 32.8 g/dL (ref 31.5–36.0)
MCV: 106.5 fL — AB (ref 79.5–101.0)
MONO#: 0.3 10*3/uL (ref 0.1–0.9)
MONO%: 14.6 % — ABNORMAL HIGH (ref 0.0–14.0)
NEUT#: 1.1 10*3/uL — ABNORMAL LOW (ref 1.5–6.5)
NEUT%: 49.7 % (ref 38.4–76.8)
PLATELETS: 162 10*3/uL (ref 145–400)
RBC: 3.2 10*6/uL — AB (ref 3.70–5.45)
RDW: 15.4 % — ABNORMAL HIGH (ref 11.2–14.5)
WBC: 2.2 10*3/uL — AB (ref 3.9–10.3)
lymph#: 0.6 10*3/uL — ABNORMAL LOW (ref 0.9–3.3)

## 2014-04-11 LAB — COMPREHENSIVE METABOLIC PANEL (CC13)
ALK PHOS: 56 U/L (ref 40–150)
ALT: 21 U/L (ref 0–55)
ANION GAP: 8 meq/L (ref 3–11)
AST: 27 U/L (ref 5–34)
Albumin: 3.6 g/dL (ref 3.5–5.0)
BILIRUBIN TOTAL: 0.58 mg/dL (ref 0.20–1.20)
BUN: 14.1 mg/dL (ref 7.0–26.0)
CO2: 25 mEq/L (ref 22–29)
CREATININE: 0.8 mg/dL (ref 0.6–1.1)
Calcium: 9.2 mg/dL (ref 8.4–10.4)
Chloride: 109 mEq/L (ref 98–109)
Glucose: 94 mg/dl (ref 70–140)
Potassium: 3.8 mEq/L (ref 3.5–5.1)
Sodium: 141 mEq/L (ref 136–145)
TOTAL PROTEIN: 6.9 g/dL (ref 6.4–8.3)

## 2014-04-11 LAB — LACTATE DEHYDROGENASE (CC13): LDH: 189 U/L (ref 125–245)

## 2014-04-11 MED ORDER — INFLUENZA VAC SPLIT QUAD 0.5 ML IM SUSY
0.5000 mL | PREFILLED_SYRINGE | Freq: Once | INTRAMUSCULAR | Status: AC
  Administered 2014-04-11: 0.5 mL via INTRAMUSCULAR
  Filled 2014-04-11: qty 0.5

## 2014-04-11 MED ORDER — SODIUM CHLORIDE 0.9 % IV SOLN
Freq: Once | INTRAVENOUS | Status: AC
  Administered 2014-04-11: 16:00:00 via INTRAVENOUS

## 2014-04-11 MED ORDER — ZOLEDRONIC ACID 4 MG/5ML IV CONC
3.5000 mg | Freq: Once | INTRAVENOUS | Status: AC
  Administered 2014-04-11: 3.5 mg via INTRAVENOUS
  Filled 2014-04-11: qty 4.38

## 2014-04-11 NOTE — Progress Notes (Signed)
[place] ONCOLOGY OFFICE PROGRESS NOTE DATE OF VISIT: 04/11/2014  [name], MD [place] [address]  DIAGNOSIS: Multiple myeloma, without mention of having achieved remission - Plan: CBC with Differential, Comprehensive metabolic panel (Cmet) - [facility], SPEP & IFE with QIG, Kappa/lambda light chains, Influenza vac split quadrivalent PF (FLUARIX) injection 0.5 mL  Chief Complaint  Patient presents with  . Follow-up    CURRENT TREATMENT:  Zometa 3.5 mg monthly started on 06/2013;  Revlimid 5 mg daily for 21 days then off for 7 days for maintenance for at least 2 years started on 09/02/2013.  Decreased to Revlimid 2.5 mg daily for 21 days then off for 7 days on 12/30/2013.     Multiple myeloma   05/26/2012 Initial Diagnosis Presenting IgG was 4,040 mg/dL on 06/05/2012 (IgA 35; IgM 34); kappa free light chain 34.4 mg/dL, lambda 0.00, kappa:lambda ratio of 34.75; SPEP with M-spike of 2.60.   06/30/2012 Imaging Numerous lytic lesions throughout the calvarium.  Lytic lesion within the left lateral scapula.  Findings most compatible with metastases or myeloma. Multiple mid and lower thoracic compression fractures.  Slight compression through the endplates at L3.   19/14/7829 Bone Marrow Biopsy Bone marrow biopsy showed 49% plasma cell. Normal classical cytogenetics; however, myeloma FISH panel showed 13q- (intermediate risk)       07/14/2012 - 07/27/2012 Radiation Therapy Palliative radiation 20 Gy over 10 fractions between to thoracic spine cord compression and symptomatic left scapula lytic lesion.   08/10/2012 -  Chemotherapy Started SQ Velcade once weekly, 3 weeks on, 1 weeks off; daily Revlimid d1-21, 7 days off; and Dexamethasone 46m PO weekly.    02[facility]2013 [facility]    06/14/2013 Tumor Marker IgG  1830,  Kappa:lamba ratio, 1.69 (baseline was 34.75 or   M-spike 0.28 (baseline of 2.6 or  89.3% of  baseline).     06/25/2013 -  Chemotherapy Started zometa 3.5 mg monthly    09/01/2013 -  Chemotherapy Maintenance therapy with revlimid 555mdaily for 14 days and then off for 7 (decreased from 21 days on and 7 days off based on neutropenia).    12/20/2013 Treatment Plan Change Maintenance therapy decreased to 2.5 mg daily for 21 days and then off for 7 days based on low counts.    01/25/2014 Tumor Marker IgG 1360 M spike 0.5   03/01/2014 - [facility], received thrombolytic therapy, cath showed 60% prox RCA, mid-distal RCA 50%, Echo normal, no stents.   04/11/2014 -  Chemotherapy Continue Revlimid at 2.5 mg 3 weeks on 1 week off   INTERVAL HISTORY: Shannon PANAMENO73.o. female with a past medical history of MM as detailed above, s/p SCT is here for follow-up. She remains on revlimid 2.5 mg for 21 days on and 7 days off.  [facility] She received thrombolytics. Cardiac catheterization demonstrated 60% proximal RCA, mid to distal RCA 50% (lesion segmental and thrombotic). Echocardiogram demonstrated normal LV function. Patient was continued on heparin and aspirin. No intervention was performed. [facility]  She was seen on today with Dr. NiKyla Balzarineffice.  They will continue plavix plus aspirin with a one-month follow. She is compliant with lipitor and metoprolol.  Echo  will be repeated in about one month.  She has an appointment coming up with Dr. Samule Ohm on 04/20/2014 and will receive her post-transplant immunizations as per protocol.  Per patient, she was instructed to continue her revlimid at the present dose. She is back to riding her bike and denies any chest discomfort. She was last seen by Dr Juliann Mule on 03/11/2014 and first visit with me.   MEDICAL  HISTORY: Past Medical History  Diagnosis Date  . Multiple myeloma 07/01/2012  . Unspecified deficiency anemia   . OSTEOPOROSIS 06/11/2010    Multiple compression fractures; and spontaneous fracture of sternum Qualifier: Diagnosis of  By: [name], [name]     . 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 Cord compression 07/13/12    MRI- diffuse myeloma involvement of T-L spine  . History of radiation therapy 07/13/12-07/27/12    spinal cord compression T3-T10,left scapula  . CAD (coronary artery disease)     a. inf STEMI (in [place] >>> [facility]) >>> LHC (8/15- [place]):  pRCA 60%, mid to dist RCA 50% >>> med rx  . Hx of echocardiogram     Echo (03/02/14-done at [facility]):  Normal LVEF without significant regional wall motion abnormalities. Mild     INTERIM HISTORY: has Anemia, macrocytic; DJD, UNSPECIFIED; OSTEOPOROSIS; Sciatica of left side; Multiple myeloma; Swelling, mass, or lump in chest; History of peripheral stem cell transplant; Unspecified vitamin D deficiency; History of organ or tissue transplant; History of ST elevation myocardial infarction (STEMI); and Coronary atherosclerosis of native coronary artery on her problem list.    ALLERGIES:  is allergic to zithromax and ciprofloxacin.  MEDICATIONS: has a current medication list which includes the following prescription(s): aspirin, atorvastatin, calcium-magnesium-vitamin d, clopidogrel, lenalidomide, metoprolol succinate, morphine, morphine, and ondansetron.  SURGICAL HISTORY:  Past Surgical History  Procedure Laterality Date  . Appendectomy    . Tubal ligation    . Cesarean section      x2   . Hip surgery  2009    left  . Elbow surgery    . Colonoscopy  2007    neg with Dr. [name]    REVIEW OF SYSTEMS:   Constitutional: Denies abnormal weight loss Eyes: Denies blurriness of vision Ears, nose, mouth, throat, and face: Denies mucositis or sore throat Respiratory:  Denies cough, dyspnea or wheezes Cardiovascular: Denies palpitation, chest discomfort or lower extremity swelling Gastrointestinal:  Denies nausea, heartburn or change in bowel habits Skin: Denies abnormal skin rashes Lymphatics: Denies new lymphadenopathy or easy bruising Neurological:Denies numbness, tingling or new weaknesses Behavioral/Psych: Mood is stable, no new changes  All other systems were reviewed with the patient and are negative.  PHYSICAL EXAMINATION: ECOG PERFORMANCE STATUS: 0  Blood pressure 114/64, pulse 80, temperature 98.2 F (36.8 C), temperature source Oral, resp. rate 17, height 5' 6" (1.676 m), weight 127 lb 14.4 oz (58.015 kg), SpO2 98.00%.  GENERAL:alert, no distress and comfortable; easily mobile to exam table; kyphosis, moderate.  SKIN: skin color, texture, turgor are normal, no rashes or significant lesions; Abrasion well healed on RLE.  EYES: normal, Conjunctiva are pink and non-injected, sclera clear OROPHARYNX:no exudate, no erythema and lips, buccal mucosa, and tongue normal, no ONJ NECK: supple, thyroid normal size, non-tender, without nodularity; nodules cervical bilaterally.  LYMPH:  no palpable lymphadenopathy in the cervical, axillary or supraclavicular LUNGS: crackles at the bases bilaterally with normal breathing effort, no wheezes or rhonchi HEART: regular rate & rhythm  and no murmurs and no lower extremity edema ABDOMEN:abdomen soft, non-tender and normal bowel sounds Musculoskeletal:no cyanosis of digits and no clubbing  NEURO: alert & oriented x 3 with fluent speech, no focal motor/sensory deficits  Labs:          Myeloma markers from today are pending. Trend in last few months shows     RADIOGRAPHIC STUDIES: None  ASSESSMENT: Shannon Rush 73 y.o. female with a history of Multiple Myeloma s/p autotransplant in August 2014 now on maintenence low dose Revlimid therapy  PLAN:    1. Pancytopenia due to chemotherapy. -- We will  continue her dose of revlimid 2.5 mg daily for 3 weeks on and then one week off.  Her WBC is 2.2 and ANC 1100.    2. IgG kappa Multiple myeloma s/p Auto SCT, intermediate risk.  --MM markers demonstrate a very good response.     - Acyclovir has been stopped after 1 year of SCT.   -- She started revlimid 5 mg daily for maintenance on 09/02/2013.   Decreased to 2.5 daily for 3 weeks on and then one week off on 12/20/2013.   -- We restarted zometa 3.5 mg q 4 weeks on 06/25/2013. She will receive an infusion today and plan for one year (monthly) then every 3 months.  --Now 13 months from SCT. She is also following closely with [facility].  --Chemistries on 09/28 revealed a creatinine of 0.8 and calcium of 9.2.  Albumin of 3.6.  --She did get a flu shot in office today.  3. Osteoporosis.  -- Patient had D[facility] scan performed at [facility] consistent with improvement to osteopenia. She will continue calcium, vitamin D and weight bearing exercises. She will continue zometa as noted above and got Zometa today 3.5 mg dose. After December, we can switch Zometa to every 3 months.   4. Follow-up.  She will follow-up with [place] in one month for a symptom visit. Repeat labs in 4 weeks to assess trend given recent decrease while on revlimid. Zometa next visit.   All questions were answered. The patient knows to call the clinic with any problems, questions or concerns. We can certainly see the patient much sooner if necessary.  I spent 25 minutes counseling the patient face to face. The total time spent in the appointment was 30 minutes.    Bernadene Bell, MD Medical Hematologist/Oncologist [place] Pager: [phone] Office No: [phone]

## 2014-04-11 NOTE — Patient Instructions (Signed)

## 2014-04-12 ENCOUNTER — Encounter: Payer: Self-pay | Admitting: Physician Assistant

## 2014-04-12 ENCOUNTER — Telehealth: Payer: Self-pay | Admitting: Hematology

## 2014-04-12 LAB — IGG, IGA, IGM
IGM, SERUM: 29 mg/dL — AB (ref 52–322)
IgA: 110 mg/dL (ref 69–380)
IgG (Immunoglobin G), Serum: 1350 mg/dL (ref 690–1700)

## 2014-04-12 LAB — KAPPA/LAMBDA LIGHT CHAINS
KAPPA FREE LGHT CHN: 3.5 mg/dL — AB (ref 0.33–1.94)
KAPPA LAMBDA RATIO: 1.04 (ref 0.26–1.65)
LAMBDA FREE LGHT CHN: 3.36 mg/dL — AB (ref 0.57–2.63)

## 2014-04-12 NOTE — Telephone Encounter (Signed)
lvm for pt regarding to OCT appts....mailed pt appt sched/avs and letter

## 2014-04-13 ENCOUNTER — Telehealth: Payer: Self-pay | Admitting: *Deleted

## 2014-04-13 NOTE — Telephone Encounter (Signed)
pt notified about lab results witth verbal understanding, cancelled lab for 10/1 since lab already done and scanned in computer.

## 2014-04-14 ENCOUNTER — Other Ambulatory Visit: Payer: Medicare Other

## 2014-04-14 ENCOUNTER — Ambulatory Visit (INDEPENDENT_AMBULATORY_CARE_PROVIDER_SITE_OTHER): Payer: Medicare Other | Admitting: Cardiovascular Disease

## 2014-04-14 ENCOUNTER — Encounter: Payer: Self-pay | Admitting: Cardiovascular Disease

## 2014-04-14 VITALS — BP 106/72 | HR 92 | Ht 66.0 in | Wt 125.8 lb

## 2014-04-14 DIAGNOSIS — I2510 Atherosclerotic heart disease of native coronary artery without angina pectoris: Secondary | ICD-10-CM

## 2014-04-14 DIAGNOSIS — C9000 Multiple myeloma not having achieved remission: Secondary | ICD-10-CM

## 2014-04-14 NOTE — Progress Notes (Signed)
Patient ID: Shannon Rush, female   DOB: 07-20-1940, 73 y.o.   MRN: [id number] 73 yo referred for cardiac clearance before bone marrow transplant Unfortunately her w/u has been at [facility] The patient accessed her MyChart. Issue appears to be abnormal Echo. Read report from 5/29 EF normal valves ok " linear mobile density in aortic root above aortic valve. She has not had any cardiac symptoms She has not had any TIA;s No clinical history of SBE, fevers. No history of connective tissue disease. She has been Rx for myeloma and is thinking about a BMT for this. She has no chest pain dyspnea or palpitation No history of vascular disease aneurysm or hypercoagulable state  Reviewed echo done at [facility] 8/15  With ? Label's excresence  Trivial MR/AR and normal EF with no HOCM.  Recommended to have TEE done at [facility] with agreement of above diagnosis and no SBE/Disection  Admitted 8/18-8/22 to the [facility] with an inferior STEMI. She received thrombolytics. Cardiac catheterization demonstrated 60% proximal RCA, mid to distal RCA 50% (lesion segmental and thrombotic). Echocardiogram demonstrated normal LV function. Patient was continued on heparin and aspirin. No intervention was performed. Recommendation was for the patient to return to [facility] and have a nuclear stress test in 6 weeks. Patient was on Revlimid (for pancytopenia from chemotherapy) and the DC summary from the hospital in [place] cites this as a possible cause for her STEMI.  She returns for FU. She is doing well. She denies chest pain. She is an avid bike rider. She has been back to cycling without chest discomfort or significant shortness of breath. She denies syncope. Denies orthopnea, PND or edema. She denies significant palpitations.  Studies:  - LHC (8/15- [place]): pRCA 60%, mid to dist RCA 50% >>> med rx  - Echo (03/02/14-done at [facility]): Normal LVEF without significant  regional wall motion abnormalities. Mild  left atrial enlargement. No significant valvular abnormalities.   Doing well ? Hypercoagulable on revlamid  Discussed coumadin but she and I prefer to maintain ASA/Plavix  She is riding her bike everywhere again   ROS: Denies fever, malais, weight loss, blurry vision, decreased visual acuity, cough, sputum, SOB, hemoptysis, pleuritic pain, palpitaitons, heartburn, abdominal pain, melena, lower extremity edema, claudication, or rash.  All other systems reviewed and negative  General: Affect appropriate Healthy:  appears stated age Kyphotic  HEENT: normal Neck supple with no adenopathy JVP normal no bruits no thyromegaly Lungs clear with no wheezing and good diaphragmatic motion Heart:  S1/S2 no murmur, no rub, gallop or click PMI normal Abdomen: benighn, BS positve, no tenderness, no AAA no bruit.  No HSM or HJR Distal pulses intact with no bruits No edema Neuro non-focal Skin warm and dry No muscular weakness   Current Outpatient Prescriptions  Medication Sig Dispense Refill  . aspirin 81 MG EC tablet Take 81 mg by mouth daily.      Marland Kitchen atorvastatin (LIPITOR) 40 MG tablet Take 1 tablet (40 mg total) by mouth daily.  30 tablet  1  . Calcium-Magnesium-Vitamin D (CALCIUM 500 PO) Take 1 tablet by mouth daily.      . clopidogrel (PLAVIX) 75 MG tablet Take 1 tablet (75 mg total) by mouth daily.  30 tablet  1  . lenalidomide (REVLIMID) 2.5 MG capsule Take 1 tablet (2.5 mg daily) for 21 days ,  and then off for 7 days.  21 capsule  0  . metoprolol  succinate (TOPROL-XL) 25 MG 24 hr tablet Take 25 mg by mouth daily.      Marland Kitchen morphine (MS CONTIN) 15 MG 12 hr tablet Take by mouth. Take 15 mg by mouth every 12 (twelve) hours. Using on as needed basis.      Marland Kitchen morphine (MSIR) 15 MG tablet Take 1 tablet (15 mg total) by mouth every 6 (six) hours as needed for severe pain.  30 tablet  0  . ondansetron (ZOFRAN) 4 MG tablet Take 4 mg by mouth every 8 (eight)  hours as needed for nausea or vomiting.       No current facility-administered medications for this visit.    Allergies  Zithromax and Ciprofloxacin  Electrocardiogram:  SR marked inferolateral T wave inversions   Assessment and Plan

## 2014-04-14 NOTE — Assessment & Plan Note (Signed)
S/P transplant  21 day cycles of revlamid  F/U [facility]

## 2014-04-14 NOTE — Patient Instructions (Signed)
Your physician wants you to follow-up in: 6 months WITH  DR Blima Singer will receive a reminder letter in the mail two months in advance. If you don't receive a letter, please call our office to schedule the follow-up appointment.  Your physician recommends that you continue on your current medications as directed. Please refer to the Current Medication list given to you today.

## 2014-04-14 NOTE — Assessment & Plan Note (Signed)
Stable with no angina and good activity level.  Continue medical Rx Continue ASA/Plavix especially during cycles of revlamid.  Beta blocker and statin Presumed thrombus to RCA

## 2014-05-02 ENCOUNTER — Other Ambulatory Visit: Payer: Self-pay | Admitting: *Deleted

## 2014-05-02 MED ORDER — METOPROLOL SUCCINATE ER 25 MG PO TB24: 25.0000 mg | ORAL_TABLET | Freq: Every day | ORAL | Status: DC

## 2014-05-02 MED ORDER — CLOPIDOGREL BISULFATE 75 MG PO TABS: 75.0000 mg | ORAL_TABLET | Freq: Every day | ORAL | Status: DC

## 2014-05-02 MED ORDER — ATORVASTATIN CALCIUM 40 MG PO TABS
40.0000 mg | ORAL_TABLET | Freq: Every day | ORAL | Status: DC
Start: 2014-05-02 — End: 2014-11-16

## 2014-05-06 ENCOUNTER — Ambulatory Visit: Payer: Medicare Other

## 2014-05-09 ENCOUNTER — Ambulatory Visit: Payer: Medicare Other

## 2014-05-09 ENCOUNTER — Other Ambulatory Visit: Payer: Medicare Other

## 2014-05-11 ENCOUNTER — Other Ambulatory Visit: Payer: Self-pay

## 2014-05-11 DIAGNOSIS — C9000 Multiple myeloma not having achieved remission: Secondary | ICD-10-CM

## 2014-05-11 MED ORDER — LENALIDOMIDE 2.5 MG PO CAPS
ORAL_CAPSULE | ORAL | Status: DC
Start: 2014-05-11 — End: 2014-06-03

## 2014-05-12 ENCOUNTER — Encounter: Payer: Self-pay | Admitting: Hematology

## 2014-05-12 ENCOUNTER — Other Ambulatory Visit (HOSPITAL_BASED_OUTPATIENT_CLINIC_OR_DEPARTMENT_OTHER): Payer: Medicare Other

## 2014-05-12 ENCOUNTER — Ambulatory Visit (HOSPITAL_BASED_OUTPATIENT_CLINIC_OR_DEPARTMENT_OTHER): Payer: Medicare Other | Admitting: Hematology

## 2014-05-12 ENCOUNTER — Ambulatory Visit (HOSPITAL_BASED_OUTPATIENT_CLINIC_OR_DEPARTMENT_OTHER): Payer: Medicare Other

## 2014-05-12 ENCOUNTER — Telehealth: Payer: Self-pay | Admitting: Hematology

## 2014-05-12 VITALS — BP 128/77 | HR 73 | Temp 97.5°F | Resp 18 | Ht 66.0 in | Wt 127.8 lb

## 2014-05-12 DIAGNOSIS — C9000 Multiple myeloma not having achieved remission: Secondary | ICD-10-CM

## 2014-05-12 DIAGNOSIS — D61810 Antineoplastic chemotherapy induced pancytopenia: Secondary | ICD-10-CM | POA: Diagnosis not present

## 2014-05-12 DIAGNOSIS — M810 Age-related osteoporosis without current pathological fracture: Secondary | ICD-10-CM

## 2014-05-12 LAB — COMPREHENSIVE METABOLIC PANEL (CC13)
ALT: 19 U/L (ref 0–55)
AST: 27 U/L (ref 5–34)
Albumin: 3.9 g/dL (ref 3.5–5.0)
Alkaline Phosphatase: 55 U/L (ref 40–150)
Anion Gap: 5 mEq/L (ref 3–11)
BILIRUBIN TOTAL: 0.77 mg/dL (ref 0.20–1.20)
BUN: 16.2 mg/dL (ref 7.0–26.0)
CO2: 25 meq/L (ref 22–29)
CREATININE: 0.8 mg/dL (ref 0.6–1.1)
Calcium: 9 mg/dL (ref 8.4–10.4)
Chloride: 109 mEq/L (ref 98–109)
GLUCOSE: 90 mg/dL (ref 70–140)
Potassium: 3.8 mEq/L (ref 3.5–5.1)
Sodium: 139 mEq/L (ref 136–145)
TOTAL PROTEIN: 6.9 g/dL (ref 6.4–8.3)

## 2014-05-12 LAB — CBC WITH DIFFERENTIAL/PLATELET
BASO%: 1.5 % (ref 0.0–2.0)
BASOS ABS: 0 10*3/uL (ref 0.0–0.1)
EOS ABS: 0.1 10*3/uL (ref 0.0–0.5)
EOS%: 4.9 % (ref 0.0–7.0)
HEMATOCRIT: 32.1 % — AB (ref 34.8–46.6)
HGB: 10.6 g/dL — ABNORMAL LOW (ref 11.6–15.9)
LYMPH%: 20.8 % (ref 14.0–49.7)
MCH: 35.1 pg — ABNORMAL HIGH (ref 25.1–34.0)
MCHC: 32.9 g/dL (ref 31.5–36.0)
MCV: 106.8 fL — ABNORMAL HIGH (ref 79.5–101.0)
MONO#: 0.4 10*3/uL (ref 0.1–0.9)
MONO%: 14.2 % — ABNORMAL HIGH (ref 0.0–14.0)
NEUT%: 58.6 % (ref 38.4–76.8)
NEUTROS ABS: 1.6 10*3/uL (ref 1.5–6.5)
PLATELETS: 165 10*3/uL (ref 145–400)
RBC: 3.01 10*6/uL — ABNORMAL LOW (ref 3.70–5.45)
RDW: 15.1 % — ABNORMAL HIGH (ref 11.2–14.5)
WBC: 2.8 10*3/uL — ABNORMAL LOW (ref 3.9–10.3)
lymph#: 0.6 10*3/uL — ABNORMAL LOW (ref 0.9–3.3)

## 2014-05-12 MED ORDER — ZOLEDRONIC ACID 4 MG/5ML IV CONC
3.5000 mg | Freq: Once | INTRAVENOUS | Status: AC
  Administered 2014-05-12: 3.5 mg via INTRAVENOUS
  Filled 2014-05-12: qty 4.38

## 2014-05-12 MED ORDER — SODIUM CHLORIDE 0.9 % IV SOLN
Freq: Once | INTRAVENOUS | Status: AC
  Administered 2014-05-12: 15:00:00 via INTRAVENOUS

## 2014-05-12 NOTE — Telephone Encounter (Signed)
Gave avs & cal for Dec. Bone Density sch for 05/30/14 @ 3pm(arrive @ 2:45)

## 2014-05-12 NOTE — Progress Notes (Signed)
[facility] PROGRESS NOTE DATE OF VISIT: 05/12/2014  [name], MD [place] [id number]  DIAGNOSIS: Multiple myeloma - Plan: SPEP & IFE with QIG, CBC with Differential, Comprehensive metabolic panel (Cmet) - [facility], Lactate dehydrogenase (LDH) - [facility], SPEP & IFE with QIG, Kappa/lambda light chains, DG Bone Density, CANCELED: CBC with Differential, CANCELED: Comprehensive metabolic panel (Cmet) - [facility], CANCELED: Lactate dehydrogenase (LDH) - [facility], CANCELED: Kappa/lambda light chains, CANCELED: DG Bone Density  Chief Complaint  Patient presents with  . Follow-up    CURRENT TREATMENT:  Zometa 3.5 mg monthly started on 06/2013;  Revlimid 5 mg daily for 21 days then off for 7 days for maintenance for at least 2 years started on 09/02/2013.  Decreased to Revlimid 2.5 mg daily for 21 days then off for 7 days on 12/30/2013.     Multiple myeloma   05/26/2012 Initial Diagnosis Presenting IgG was 4,040 mg/dL on 06/05/2012 (IgA 35; IgM 34); kappa free light chain 34.4 mg/dL, lambda 0.00, kappa:lambda ratio of 34.75; SPEP with M-spike of 2.60.   06/30/2012 Imaging Numerous lytic lesions throughout the calvarium.  Lytic lesion within the left lateral scapula.  Findings most compatible with metastases or myeloma. Multiple mid and lower thoracic compression fractures.  Slight compression through the endplates at L3.   54/65/0354 Bone Marrow Biopsy Bone marrow biopsy showed 49% plasma cell. Normal classical cytogenetics; however, myeloma FISH panel showed 13q- (intermediate risk)       07/14/2012 - 07/27/2012 Radiation Therapy Palliative radiation 20 Gy over 10 fractions between to thoracic spine cord compression and symptomatic left scapula lytic lesion.   08/10/2012 -  Chemotherapy Started SQ Velcade once weekly, 3 weeks on, 1 weeks off; daily Revlimid d1-21, 7 days off; and Dexamethasone 53m PO weekly.    02/12/2013 [facility]    06/14/2013 Tumor Marker IgG  1830,  Kappa:lamba ratio, 1.69 (baseline was 34.75 or   M-spike 0.28 (baseline of 2.6 or  89.3% of baseline).     06/25/2013 -  Chemotherapy Started zometa 3.5 mg monthly    09/01/2013 -  Chemotherapy Maintenance therapy with revlimid 517mdaily for 14 days and then off for 7 (decreased from 21 days on and 7 days off based on neutropenia).    12/20/2013 Treatment Plan Change Maintenance therapy decreased to 2.5 mg daily for 21 days and then off for 7 days based on low counts.    01/25/2014 Tumor Marker IgG 1360 M spike 0.5   03/01/2014 - 03/05/2014 [facility], received thrombolytic therapy, cath showed 60% prox RCA, mid-distal RCA 50%, Echo normal, no stents.   04/11/2014 -  Chemotherapy Continue Revlimid at 2.5 mg 3 weeks on 1 week off   INTERVAL HISTORY:  ClPETRA DUMLER89.o. female with a past medical history of MM as detailed above, s/p SCT is here for follow-up. She remains on revlimid 2.5 mg for 21 days on and 7 days off.  [facility] She received thrombolytics. Cardiac catheterization demonstrated 60% proximal RCA, mid to distal RCA 50% (lesion segmental and thrombotic). Echocardiogram demonstrated normal LV function. Patient was continued on heparin and aspirin. No intervention was performed. [facility]  She was seen on today  with Dr. Kyla Balzarine office.  They will continue plavix plus aspirin with a one-month follow. She is compliant with lipitor and metoprolol.  Echo will be repeated in about one month.  She has an appointment coming up with Dr. Samule Ohm on 04/20/2014 and will receive her post-transplant immunizations as per protocol.  Per patient, she was instructed to continue her revlimid at the  present dose. She is back to riding her bike and denies any chest discomfort. She was last seen by me on 04/11/14.  Since then patient has been to [facility] and got her vaccinations. She continues to do well on very low-dose of Revlimid. She has a left eye tear in the conjunctiva which happened spontaneously. Her next cycle of Revlimid will start on 05/16/2014 this is her week off therapy. She will have a follow-up at [facility] in 6 months. Patient is due for a bone density which I will order before her next visit.   MEDICAL HISTORY: Past Medical History  Diagnosis Date  . Multiple myeloma 07/01/2012  . Unspecified deficiency anemia   . OSTEOPOROSIS 06/11/2010    Multiple compression fractures; and spontaneous fracture of sternum Qualifier: Diagnosis of  By: [name], [name]     . 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 Cord compression 07/13/12    MRI- diffuse myeloma involvement of T-L spine  . History of radiation therapy 07/13/12-07/27/12    spinal cord compression T3-T10,left scapula  . CAD (coronary artery disease)     a. inf STEMI (in [place] >>> [facility]) >>> LHC (8/15- [place]):  pRCA 60%, mid to dist RCA 50% >>> med rx  . Hx of echocardiogram     Echo (03/02/14-done at [facility]):  Normal LVEF without significant regional wall motion abnormalities. Mild     INTERIM HISTORY: has Anemia, macrocytic; DJD, UNSPECIFIED; OSTEOPOROSIS; Sciatica of left side; Multiple myeloma; Swelling, mass, or lump in chest; History of peripheral stem cell transplant; Unspecified vitamin D deficiency; History of organ or tissue transplant; History of ST elevation myocardial infarction (STEMI); and Coronary atherosclerosis of native coronary artery on her problem list.    ALLERGIES:  is allergic to zithromax and ciprofloxacin.  MEDICATIONS: has a current medication list which includes the following prescription(s): aspirin, atorvastatin, calcium-magnesium-vitamin  d, clopidogrel, lenalidomide, metoprolol succinate, morphine, morphine, and ondansetron.  SURGICAL HISTORY:  Past Surgical History  Procedure Laterality Date  . Appendectomy    . Tubal ligation    . Cesarean section      x2   . Hip surgery  2009    left  . Elbow surgery    . Colonoscopy  2007    neg with Dr. [name]    REVIEW OF SYSTEMS:   Constitutional: Denies abnormal weight loss Eyes: Denies blurriness of vision Ears, nose, mouth, throat, and face: Denies mucositis or sore throat Respiratory: Denies cough, dyspnea or wheezes Cardiovascular: Denies palpitation, chest discomfort or lower extremity swelling Gastrointestinal:  Denies nausea, heartburn or change in bowel habits Skin: Denies abnormal skin rashes Lymphatics: Denies new lymphadenopathy or easy bruising Neurological:Denies numbness, tingling or new weaknesses Behavioral/Psych: Mood is stable, no new changes  All other systems were reviewed with the patient and are negative.  PHYSICAL EXAMINATION: ECOG PERFORMANCE STATUS: 0  Blood pressure 128/77, pulse 73, temperature 97.5 F (36.4 C), temperature source Oral, resp. rate 18, height _0  (1.676 m), weight 127 lb 12.8 oz (57.97 kg), SpO2 99.00%.  GENERAL:alert, no distress  and comfortable; easily mobile to exam table; kyphosis, moderate.  SKIN: skin color, texture, turgor are normal, no rashes or significant lesions; Abrasion well healed on RLE.  EYES: normal, Conjunctiva are pink, 1 tear/injectionof blood vessel left eye in sclera. OROPHARYNX:no exudate, no erythema and lips, buccal mucosa, and tongue normal, no ONJ NECK: supple, thyroid normal size, non-tender, without nodularity; nodules cervical bilaterally.  LYMPH:  no palpable lymphadenopathy in the cervical, axillary or supraclavicular LUNGS: crackles at the bases bilaterally with normal breathing effort, no wheezes or rhonchi HEART: regular rate & rhythm and no murmurs and no lower extremity  edema ABDOMEN:abdomen soft, non-tender and normal bowel sounds Musculoskeletal:no cyanosis of digits and no clubbing  NEURO: alert & oriented x 3 with fluent speech, no focal motor/sensory deficits  Labs:              Myeloma markers from today are pending. Trend in last few months shows     RADIOGRAPHIC STUDIES: None  ASSESSMENT: CHAR FELTMAN 73 y.o. female with a history of Multiple Myeloma s/p autotransplant in August 2014 now on maintenence low dose Revlimid therapy  PLAN:    1. Pancytopenia due to chemotherapy. -- We will continue her dose of revlimid 2.5 mg daily for 3 weeks on and then one week off.  Her WBC is 2.8 and ANC 1600.    2. IgG kappa Multiple myeloma s/p Auto SCT, intermediate risk.  --MM markers demonstrate a very good response.     - Acyclovir has been stopped after 1 year of SCT.   -- She started revlimid 5 mg daily for maintenance on 09/02/2013.   Decreased to 2.5 daily for 3 weeks on and then one week off on 12/20/2013.   -- We restarted zometa 3.5 mg q 4 weeks on 06/25/2013. She will receive an infusion today and plan for one year (monthly) then every 3 months.  --Now 13 months from SCT. She is also following closely with [facility].  --Chemistries on 10/29 revealed a creatinine of 0.8 and calcium of 9.0.  Albumin of 3.9.  --She did get a flu shot in office today.Also got other vaccines at [facility]  3. Osteoporosis.  -- As per Dr Juliann Mule patient had DEXA scan performed at [facility] consistent with improvement to osteopenia. I looked at [facility] imaging and could not find it and patient does not remember taht either, she used to go to [place] for bone density. I am ordering one on 06/03/14 prior to her next visit. She will continue calcium, vitamin D and weight bearing exercises. She will continue zometa as noted above and got Zometa today 3.5 mg dose. After December, we can switch Zometa to every 3 months.   4. Follow-up.  She will follow-up with [place] in one  month 12/3 for a symptom visit. Repeat labs in 4 weeks to assess trend given recent decrease while on revlimid.   All questions were answered. The patient knows to call the clinic with any problems, questions or concerns. We can certainly see the patient much sooner if necessary.  I spent 25 minutes counseling the patient face to face. The total time spent in the appointment was 30 minutes.    Bernadene Bell, MD Medical Hematologist/Oncologist [place] Pager: [phone] Office No: [phone]

## 2014-05-12 NOTE — Patient Instructions (Signed)

## 2014-05-16 LAB — KAPPA/LAMBDA LIGHT CHAINS
Kappa free light chain: 2.09 mg/dL — ABNORMAL HIGH (ref 0.33–1.94)
Kappa:Lambda Ratio: 1.18 (ref 0.26–1.65)
Lambda Free Lght Chn: 1.77 mg/dL (ref 0.57–2.63)

## 2014-05-16 LAB — SPEP & IFE WITH QIG
ALPHA-2-GLOBULIN: 8.5 % (ref 7.1–11.8)
Albumin ELP: 58 % (ref 55.8–66.1)
Alpha-1-Globulin: 4.5 % (ref 2.9–4.9)
Beta 2: 4 % (ref 3.2–6.5)
Beta Globulin: 5.6 % (ref 4.7–7.2)
Gamma Globulin: 19.4 % — ABNORMAL HIGH (ref 11.1–18.8)
IGG (IMMUNOGLOBIN G), SERUM: 1310 mg/dL (ref 690–1700)
IGM, SERUM: 32 mg/dL — AB (ref 52–322)
IgA: 113 mg/dL (ref 69–380)
M-Spike, %: 0.5 g/dL
Total Protein, Serum Electrophoresis: 6.6 g/dL (ref 6.0–8.3)

## 2014-06-02 ENCOUNTER — Other Ambulatory Visit: Payer: Self-pay | Admitting: Hematology

## 2014-06-03 ENCOUNTER — Other Ambulatory Visit: Payer: Self-pay | Admitting: *Deleted

## 2014-06-03 DIAGNOSIS — C9000 Multiple myeloma not having achieved remission: Secondary | ICD-10-CM

## 2014-06-03 MED ORDER — LENALIDOMIDE 2.5 MG PO CAPS: ORAL_CAPSULE | ORAL | Status: DC

## 2014-06-03 NOTE — Telephone Encounter (Signed)
Refill request for Revlimid to MD desk for approval. 

## 2014-06-03 NOTE — Addendum Note (Signed)
Addended by: Tania Ade on: 06/03/2014 11:39 AM   Modules accepted: Orders

## 2014-06-08 ENCOUNTER — Telehealth: Payer: Self-pay | Admitting: Hematology

## 2014-06-08 NOTE — Telephone Encounter (Signed)
moved CP1 12/3 to [place] 12/8. lmonvm for pt and mailed schedule.

## 2014-06-16 ENCOUNTER — Ambulatory Visit: Payer: Medicare Other

## 2014-06-16 ENCOUNTER — Other Ambulatory Visit: Payer: Medicare Other

## 2014-06-21 ENCOUNTER — Ambulatory Visit: Payer: Medicare Other | Admitting: Hematology

## 2014-06-21 ENCOUNTER — Other Ambulatory Visit: Payer: Medicare Other

## 2014-06-23 ENCOUNTER — Ambulatory Visit (HOSPITAL_BASED_OUTPATIENT_CLINIC_OR_DEPARTMENT_OTHER): Payer: Medicare Other | Admitting: Hematology

## 2014-06-23 ENCOUNTER — Ambulatory Visit (HOSPITAL_BASED_OUTPATIENT_CLINIC_OR_DEPARTMENT_OTHER): Payer: Medicare Other

## 2014-06-23 ENCOUNTER — Encounter: Payer: Self-pay | Admitting: Hematology

## 2014-06-23 ENCOUNTER — Other Ambulatory Visit (HOSPITAL_BASED_OUTPATIENT_CLINIC_OR_DEPARTMENT_OTHER): Payer: Medicare Other

## 2014-06-23 ENCOUNTER — Telehealth: Payer: Self-pay | Admitting: Hematology

## 2014-06-23 VITALS — BP 117/71 | HR 80 | Temp 97.7°F | Resp 18 | Ht 65.0 in | Wt 126.5 lb

## 2014-06-23 DIAGNOSIS — D61810 Antineoplastic chemotherapy induced pancytopenia: Secondary | ICD-10-CM

## 2014-06-23 DIAGNOSIS — C9000 Multiple myeloma not having achieved remission: Secondary | ICD-10-CM

## 2014-06-23 DIAGNOSIS — M810 Age-related osteoporosis without current pathological fracture: Secondary | ICD-10-CM

## 2014-06-23 LAB — COMPREHENSIVE METABOLIC PANEL (CC13)
ALK PHOS: 87 U/L (ref 40–150)
ALT: 28 U/L (ref 0–55)
AST: 28 U/L (ref 5–34)
Albumin: 3.7 g/dL (ref 3.5–5.0)
Anion Gap: 11 mEq/L (ref 3–11)
BUN: 16.2 mg/dL (ref 7.0–26.0)
CALCIUM: 9.3 mg/dL (ref 8.4–10.4)
CO2: 26 mEq/L (ref 22–29)
Chloride: 104 mEq/L (ref 98–109)
Creatinine: 0.8 mg/dL (ref 0.6–1.1)
EGFR: 70 mL/min/{1.73_m2} — ABNORMAL LOW (ref >90)
Glucose: 103 mg/dl (ref 70–140)
Potassium: 3.5 mEq/L (ref 3.5–5.1)
SODIUM: 141 meq/L (ref 136–145)
TOTAL PROTEIN: 6.9 g/dL (ref 6.4–8.3)
Total Bilirubin: 0.55 mg/dL (ref 0.20–1.20)

## 2014-06-23 LAB — CBC WITH DIFFERENTIAL/PLATELET
BASO%: 2.5 % — AB (ref 0.0–2.0)
BASOS ABS: 0.1 10*3/uL (ref 0.0–0.1)
EOS%: 4.4 % (ref 0.0–7.0)
Eosinophils Absolute: 0.1 10*3/uL (ref 0.0–0.5)
HCT: 32.3 % — ABNORMAL LOW (ref 34.8–46.6)
HGB: 10.5 g/dL — ABNORMAL LOW (ref 11.6–15.9)
LYMPH%: 17.6 % (ref 14.0–49.7)
MCH: 34.4 pg — AB (ref 25.1–34.0)
MCHC: 32.6 g/dL (ref 31.5–36.0)
MCV: 105.7 fL — AB (ref 79.5–101.0)
MONO#: 0.2 10*3/uL (ref 0.1–0.9)
MONO%: 6.6 % (ref 0.0–14.0)
NEUT%: 68.9 % (ref 38.4–76.8)
NEUTROS ABS: 1.8 10*3/uL (ref 1.5–6.5)
Platelets: 217 10*3/uL (ref 145–400)
RBC: 3.06 10*6/uL — AB (ref 3.70–5.45)
RDW: 15.6 % — AB (ref 11.2–14.5)
WBC: 2.6 10*3/uL — ABNORMAL LOW (ref 3.9–10.3)
lymph#: 0.5 10*3/uL — ABNORMAL LOW (ref 0.9–3.3)

## 2014-06-23 LAB — LACTATE DEHYDROGENASE (CC13): LDH: 202 U/L (ref 125–245)

## 2014-06-23 MED ORDER — SODIUM CHLORIDE 0.9 % IV SOLN
3.5000 mg | Freq: Once | INTRAVENOUS | Status: AC
  Administered 2014-06-23: 3.5 mg via INTRAVENOUS
  Filled 2014-06-23: qty 4.38

## 2014-06-23 MED ORDER — SODIUM CHLORIDE 0.9 % IV SOLN
Freq: Once | INTRAVENOUS | Status: AC
  Administered 2014-06-23: 10:00:00 via INTRAVENOUS

## 2014-06-23 NOTE — Progress Notes (Signed)
[facility]COL[facility] OFFICE PROGRESS NOTE DATE OF VISIT: 05/12/2014  [name], MD [place] [address]  DIAGNOSIS: No diagnosis found.  Chief Complaint  Patient presents with  . Follow-up    multiple myeloma    CURRENT TREATMENT:  Zometa 3.5 mg monthly started on 06/2013;  Revlimid 5 mg daily for 21 days then off for 7 days for maintenance for at least 2 years started on 09/02/2013.  Decreased to Revlimid 2.5 mg daily for 21 days then off for 7 days on 12/30/2013.     Multiple myeloma   05/26/2012 Initial Diagnosis Presenting IgG was 4,040 mg/dL on 06/05/2012 (IgA 35; IgM 34); kappa free light chain 34.4 mg/dL, lambda 0.00, kappa:lambda ratio of 34.75; SPEP with M-spike of 2.60.   06/30/2012 Imaging Numerous lytic lesions throughout the calvarium.  Lytic lesion within the left lateral scapula.  Findings most compatible with metastases or myeloma. Multiple mid and lower thoracic compression fractures.  Slight compression through the endplates at L3.   42/70/6237 Bone Marrow Biopsy Bone marrow biopsy showed 49% plasma cell. Normal classical cytogenetics; however, myeloma FISH panel showed 13q- (intermediate risk)       07/14/2012 - 07/27/2012 Radiation Therapy Palliative radiation 20 Gy over 10 fractions between to thoracic spine cord compression and symptomatic left scapula lytic lesion.   08/10/2012 -  Chemotherapy Started SQ Velcade once weekly, 3 weeks on, 1 weeks off; daily Revlimid d1-21, 7 days off; and Dexamethasone 26m PO weekly.    02/12/2013 [facility]    06/14/2013 Tumor Marker IgG  1830,  Kappa:lamba ratio, 1.69 (baseline was 34.75 or   M-spike 0.28 (baseline of 2.6 or  89.3% of baseline).     06/25/2013 -  Chemotherapy Started zometa 3.5 mg monthly    09/01/2013 -  Chemotherapy Maintenance therapy with revlimid 565mdaily for 14 days and then off for 7 (decreased from 21 days on and 7  days off based on neutropenia).    12/20/2013 Treatment Plan Change Maintenance therapy decreased to 2.5 mg daily for 21 days and then off for 7 days based on low counts.    01/25/2014 Tumor Marker IgG 1360 M spike 0.5   03/01/2014 - 03/05/2014 [facility], received thrombolytic therapy, cath showed 60% prox RCA, mid-distal RCA 50%, Echo normal, no stents.   04/11/2014 -  Chemotherapy Continue Revlimid at 2.5 mg 3 weeks on 1 week off   INTERVAL HISTORY:  [name]89.o. female with a past medical history of MM as detailed above, s/p SCT is here for follow-up. She remains on revlimid 2.5 mg for 21 days on and 7 days off.  [facility] She received thrombolytics. Cardiac catheterization demonstrated 60% proximal RCA, mid to distal RCA 50% (lesion segmental and thrombotic). Echocardiogram demonstrated normal LV function. Patient was continued on heparin and aspirin. No intervention was performed. [facility]  She was seen on today with Dr. NiKyla Balzarineffice.  They will continue plavix plus aspirin with a one-month follow. She is compliant with lipitor and metoprolol.  Echo will be repeated in about one month.  She has an appointment coming up with Dr. GaSamule Ohmn 04/20/2014 and will receive her post-transplant immunizations as per  protocol.  Per patient, she was instructed to continue her revlimid at the present dose. She is back to riding her bike and denies any chest discomfort. She was last seen by me on 04/11/14.  She is tolerating the low dose Revlimid well. She is doing well overall, she is on day 8 of this cycle.  MEDICAL HISTORY: Past Medical History  Diagnosis Date  . Multiple myeloma 07/01/2012  . Unspecified deficiency anemia   . OSTEOPOROSIS 06/11/2010      Multiple compression fractures; and spontaneous fracture of sternum Qualifier: Diagnosis of  By: [name], [name]     . 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 Cord compression 07/13/12    MRI- diffuse myeloma involvement of T-L spine  . History of radiation therapy 07/13/12-07/27/12    spinal cord compression T3-T10,left scapula  . CAD (coronary artery disease)     a. inf STEMI (in [place] >>> lytics) >>> LHC (8/15- [place]):  pRCA 60%, mid to dist RCA 50% >>> med rx  . Hx of echocardiogram     Echo (03/02/14-[facility]  Normal LVEF without significant regional wall motion abnormalities. Mild     INTERIM HISTORY: has Anemia, macrocytic; DJD, UNSPECIFIED; OSTEOPOROSIS; Sciatica of left side; Multiple myeloma; Swelling, mass, or lump in chest; History of peripheral stem cell transplant; Unspecified vitamin D deficiency; History of organ or tissue transplant; History of ST elevation myocardial infarction (STEMI); and Coronary atherosclerosis of native coronary artery on her problem list.    ALLERGIES:  is allergic to zithromax and ciprofloxacin.  MEDICATIONS: has a current medication list which includes the following prescription(s): aspirin, atorvastatin, calcium carbonate, clopidogrel, lenalidomide, metoprolol succinate, morphine, morphine, and ondansetron.  SURGICAL HISTORY:  Past Surgical History  Procedure Laterality Date  . Appendectomy    . Tubal ligation    . Cesarean section      x2   . Hip surgery  2009    left  . Elbow surgery    . Colonoscopy  2007    neg with Dr. [name]    REVIEW OF SYSTEMS:   Constitutional: Denies abnormal weight loss Eyes: Denies blurriness of vision Ears, nose, mouth, throat, and face: Denies mucositis or sore throat Respiratory: Denies cough, dyspnea or wheezes Cardiovascular: Denies palpitation, chest discomfort or lower extremity swelling Gastrointestinal:  Denies nausea,  heartburn or change in bowel habits Skin: Denies abnormal skin rashes Lymphatics: Denies new lymphadenopathy or easy bruising Neurological:Denies numbness, tingling or new weaknesses Behavioral/Psych: Mood is stable, no new changes  All other systems were reviewed with the patient and are negative.  PHYSICAL EXAMINATION: ECOG PERFORMANCE STATUS: 0  Blood pressure 117/71, pulse 80, temperature 97.7 F (36.5 C), resp. rate 18, height 5' 5"  (1.651 m), weight 126 lb 8 oz (57.38 kg), SpO2 99 %.  GENERAL:alert, no distress and comfortable; easily mobile to exam table; kyphosis, moderate.  SKIN: skin color, texture, turgor are normal, no rashes or significant lesions; Abrasion well healed on RLE.  EYES: normal, Conjunctiva are pink, 1 tear/injectionof blood vessel left eye in sclera. OROPHARYNX:no exudate, no erythema and lips, buccal mucosa, and tongue normal, no ONJ NECK: supple, thyroid normal size, non-tender, without nodularity; nodules cervical bilaterally.  LYMPH:  no palpable lymphadenopathy in the cervical, axillary or supraclavicular LUNGS: crackles at the bases bilaterally with normal breathing effort, no wheezes or rhonchi HEART: regular rate & rhythm and no murmurs and no lower extremity edema ABDOMEN:abdomen  soft, non-tender and normal bowel sounds Musculoskeletal:no cyanosis of digits and no clubbing  NEURO: alert & oriented x 3 with fluent speech, no focal motor/sensory deficits  Labs:              Myeloma markers from today are pending. Trend in last few months shows     RADIOGRAPHIC STUDIES: None  ASSESSMENT: SUMIRE HALBLEIB 73 y.o. female with a history of Multiple Myeloma s/p autotransplant in August 2014 now on maintenence low dose Revlimid therapy  PLAN:   1. IgG kappa Multiple myeloma s/p Auto SCT, intermediate risk.  --MM markers demonstrate a very good response.     - Acyclovir has been stopped after 1 year of SCT.   -- She started revlimid 5 mg  daily for maintenance on 09/02/2013.   Decreased to 2.5 daily for 3 weeks on and then one week off on 12/20/2013.   -- We restarted zometa 3.5 mg q 4 weeks on 06/25/2013. She will receive an infusion today and plan for 1.5  year (monthly) then every 3 months.  - She is also following closely with [facility] transplant team.  --vaccine are uptodate We will continue her dose of revlimid 2.5 mg daily for 3 weeks on and then one week off.  2. Pancytopenia due to chemotherapy. --   Her blood counts are stable   3. Osteoporosis -recently repeated bone density scan showed worsening osteoporosis, will continue zometa monthly for additional 6 month then change to every 3 months.   Plan -RTC in one month with labs  -She wants PT for her kyphosis, order given to her today   All questions were answered. The patient knows to call the clinic with any problems, questions or concerns. We can certainly see the patient much sooner if necessary.  I spent 25 minutes counseling the patient face to face. The total time spent in the appointment was 30 minutes.   [name]  06/23/2014

## 2014-06-23 NOTE — Telephone Encounter (Signed)
gv and printed appt sched and avs for pt for Dec...sed added tx. °

## 2014-06-23 NOTE — Patient Instructions (Signed)

## 2014-06-28 LAB — SPEP & IFE WITH QIG
ALBUMIN ELP: 56.7 % (ref 55.8–66.1)
ALPHA-1-GLOBULIN: 5 % — AB (ref 2.9–4.9)
ALPHA-2-GLOBULIN: 8.7 % (ref 7.1–11.8)
BETA GLOBULIN: 6 % (ref 4.7–7.2)
Beta 2: 4.1 % (ref 3.2–6.5)
Gamma Globulin: 19.5 % — ABNORMAL HIGH (ref 11.1–18.8)
IGG (IMMUNOGLOBIN G), SERUM: 1250 mg/dL (ref 690–1700)
IgA: 132 mg/dL (ref 69–380)
IgM, Serum: 36 mg/dL — ABNORMAL LOW (ref 52–322)
M-SPIKE, %: 0.45 g/dL
TOTAL PROTEIN, SERUM ELECTROPHOR: 6.4 g/dL (ref 6.0–8.3)

## 2014-06-28 LAB — KAPPA/LAMBDA LIGHT CHAINS
KAPPA FREE LGHT CHN: 4.52 mg/dL — AB (ref 0.33–1.94)
KAPPA LAMBDA RATIO: 1.73 — AB (ref 0.26–1.65)
LAMBDA FREE LGHT CHN: 2.62 mg/dL (ref 0.57–2.63)

## 2014-07-06 ENCOUNTER — Other Ambulatory Visit: Payer: Self-pay | Admitting: *Deleted

## 2014-07-06 DIAGNOSIS — C9000 Multiple myeloma not having achieved remission: Secondary | ICD-10-CM

## 2014-07-06 MED ORDER — LENALIDOMIDE 2.5 MG PO CAPS: ORAL_CAPSULE | ORAL | Status: DC

## 2014-07-21 ENCOUNTER — Other Ambulatory Visit (HOSPITAL_BASED_OUTPATIENT_CLINIC_OR_DEPARTMENT_OTHER): Payer: 59

## 2014-07-21 ENCOUNTER — Encounter: Payer: Self-pay | Admitting: Hematology

## 2014-07-21 ENCOUNTER — Ambulatory Visit: Payer: Medicare Other

## 2014-07-21 ENCOUNTER — Ambulatory Visit (HOSPITAL_BASED_OUTPATIENT_CLINIC_OR_DEPARTMENT_OTHER): Payer: 59 | Admitting: Hematology

## 2014-07-21 VITALS — BP 121/69 | HR 78 | Temp 97.5°F | Resp 18 | Ht 65.0 in | Wt 126.8 lb

## 2014-07-21 DIAGNOSIS — C9000 Multiple myeloma not having achieved remission: Secondary | ICD-10-CM

## 2014-07-21 DIAGNOSIS — M810 Age-related osteoporosis without current pathological fracture: Secondary | ICD-10-CM | POA: Diagnosis not present

## 2014-07-21 DIAGNOSIS — D61810 Antineoplastic chemotherapy induced pancytopenia: Secondary | ICD-10-CM | POA: Diagnosis not present

## 2014-07-21 LAB — COMPREHENSIVE METABOLIC PANEL (CC13)
ALT: 20 U/L (ref 0–55)
ANION GAP: 9 meq/L (ref 3–11)
AST: 27 U/L (ref 5–34)
Albumin: 4 g/dL (ref 3.5–5.0)
Alkaline Phosphatase: 64 U/L (ref 40–150)
BILIRUBIN TOTAL: 0.73 mg/dL (ref 0.20–1.20)
BUN: 14.9 mg/dL (ref 7.0–26.0)
CALCIUM: 9.1 mg/dL (ref 8.4–10.4)
CHLORIDE: 105 meq/L (ref 98–109)
CO2: 27 mEq/L (ref 22–29)
Creatinine: 0.8 mg/dL (ref 0.6–1.1)
EGFR: 70 mL/min/{1.73_m2} — ABNORMAL LOW (ref >90)
GLUCOSE: 81 mg/dL (ref 70–140)
Potassium: 3.4 mEq/L — ABNORMAL LOW (ref 3.5–5.1)
SODIUM: 141 meq/L (ref 136–145)
TOTAL PROTEIN: 7.2 g/dL (ref 6.4–8.3)

## 2014-07-21 LAB — CBC WITH DIFFERENTIAL/PLATELET
BASO%: 3 % — ABNORMAL HIGH (ref 0.0–2.0)
BASOS ABS: 0.1 10*3/uL (ref 0.0–0.1)
EOS ABS: 0.1 10*3/uL (ref 0.0–0.5)
EOS%: 5.1 % (ref 0.0–7.0)
HCT: 35 % (ref 34.8–46.6)
HGB: 11.3 g/dL — ABNORMAL LOW (ref 11.6–15.9)
LYMPH#: 0.5 10*3/uL — AB (ref 0.9–3.3)
LYMPH%: 20.9 % (ref 14.0–49.7)
MCH: 34 pg (ref 25.1–34.0)
MCHC: 32.3 g/dL (ref 31.5–36.0)
MCV: 105.4 fL — AB (ref 79.5–101.0)
MONO#: 0.2 10*3/uL (ref 0.1–0.9)
MONO%: 9.8 % (ref 0.0–14.0)
NEUT#: 1.4 10*3/uL — ABNORMAL LOW (ref 1.5–6.5)
NEUT%: 61.2 % (ref 38.4–76.8)
Platelets: 168 10*3/uL (ref 145–400)
RBC: 3.32 10*6/uL — ABNORMAL LOW (ref 3.70–5.45)
RDW: 15.9 % — ABNORMAL HIGH (ref 11.2–14.5)
WBC: 2.4 10*3/uL — ABNORMAL LOW (ref 3.9–10.3)

## 2014-07-21 NOTE — Progress Notes (Signed)
[place]. Regarding a patient more (or I would like to you regarding she call back with use of 9 15 and his vision is without a [place] NOTE DATE OF VISIT: 05/12/2014  Shannon Gottron, MD [place] [address]  DIAGNOSIS: Multiple myeloma - Plan: SPEP & IFE with QIG, Kappa/lambda light chains  Chief Complaint  Patient presents with  . Follow-up    CURRENT TREATMENT:  Zometa 3.5 mg monthly started on 06/2013;  Revlimid 5 mg daily for 21 days then off for 7 days for maintenance for at least 2 years started on 09/02/2013.  Decreased to Revlimid 2.5 mg daily for 21 days then off for 7 days on 12/30/2013.     Multiple myeloma   05/26/2012 Initial Diagnosis Presenting IgG was 4,040 mg/dL on 06/05/2012 (IgA 35; IgM 34); kappa free light chain 34.4 mg/dL, lambda 0.00, kappa:lambda ratio of 34.75; SPEP with M-spike of 2.60.   06/30/2012 Imaging Numerous lytic lesions throughout the calvarium.  Lytic lesion within the left lateral scapula.  Findings most compatible with metastases or myeloma. Multiple mid and lower thoracic compression fractures.  Slight compression through the endplates at L3.   41/28/7867 Bone Marrow Biopsy Bone marrow biopsy showed 49% plasma cell. Normal classical cytogenetics; however, myeloma FISH panel showed 13q- (intermediate risk)       07/14/2012 - 07/27/2012 Radiation Therapy Palliative radiation 20 Gy over 10 fractions between to thoracic spine cord compression and symptomatic left scapula lytic lesion.   08/10/2012 -  Chemotherapy Started SQ Velcade once weekly, 3 weeks on, 1 weeks off; daily Revlimid d1-21, 7 days off; and Dexamethasone 3m PO weekly.    02[facility]12/2013 [facility]    06/14/2013 Tumor Marker IgG  1830,  Kappa:lamba ratio, 1.69 (baseline was 34.75 or   M-spike 0.28 (baseline of 2.6 or  89.3% of baseline).     06/25/2013 -   Chemotherapy Started zometa 3.5 mg monthly    09/01/2013 -  Chemotherapy Maintenance therapy with revlimid 571mdaily for 14 days and then off for 7 (decreased from 21 days on and 7 days off based on neutropenia).    12/20/2013 Treatment Plan Change Maintenance therapy decreased to 2.5 mg daily for 21 days and then off for 7 days based on low counts.    01/25/2014 Tumor Marker IgG 1360 M spike 0.5   03/01/2014 - 03/05/2014 [facility], received thrombolytic therapy, cath showed 60% prox RCA, mid-distal RCA 50%, Echo normal, no stents.   04/11/2014 -  Chemotherapy Continue Revlimid at 2.5 mg 3 weeks on 1 week off   INTERVAL HISTORY:  [name]74.o. female with a past medical history of MM as detailed above, s/p SCT is here for follow-up. She remains on revlimid 2.5 mg for 21 days on and 7 days off.  [facility] She received thrombolytics. Cardiac catheterization demonstrated 60% proximal RCA, mid to distal RCA 50% (lesion segmental and thrombotic). Echocardiogram demonstrated normal LV function. Patient was continued on heparin and aspirin. No intervention was performed. [facility]ation [facility]  She was seen on today with Dr. NiKyla Rush.  They will continue plavix plus aspirin with a one-month follow. She  is compliant with lipitor and metoprolol.  Echo will be repeated in about one month.  She has an appointment coming up with Dr. Samule Rush on 04/20/2014 and will receive her post-transplant immunizations as per protocol.  Per patient, she was instructed to continue her revlimid at the present dose. She is back to riding her bike and denies any chest discomfort. She was last seen by me on 73/10/15.   She is tolerating the low dose Revlimid well. She started this cycle on  07/10/14.She is doing well overall, she is on day 9 of this cycle. She has been having left lower molar tooth pain for the past month, and saw her dentist Dr. Deanna Rush [phone]) last week, who recommended to remove it due to the concern of infection.   MEDICAL HISTORY: Past Medical History  Diagnosis Date  . Multiple myeloma 07/01/2012  . Unspecified deficiency anemia   . OSTEOPOROSIS 06/11/2010    Multiple compression fractures; and spontaneous fracture of sternum Qualifier: Diagnosis of  By: [name], [name]     . 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 Cord compression 07/13/12    MRI- diffuse myeloma involvement of T-L spine  . History of radiation therapy 07/13/12-07/27/12    spinal cord compression T3-T10,left scapula  . CAD (coronary artery disease)     a. inf STEMI (in [place] >>> [facility]ytics) >>> LHC (8/15- [place]):  pRCA 60%, mid to dist RCA 50% >>> med rx  . Hx of echocardiogram     Echo (03/02/14-done at [facility]):  Normal LVEF without significant regional wall motion abnormalities. Mild      ALLERGIES:  is allergic to zithromax and ciprofloxacin.  MEDICATIONS: has a current medication list which includes the following prescription(s): aspirin, atorvastatin, calcium carbonate, clopidogrel, durezol, lenalidomide, metoprolol succinate, morphine, morphine, ondansetron, prolensa, and vitamin d (cholecalciferol).  SURGICAL HISTORY:  Past Surgical History  Procedure Laterality Date  . Appendectomy    . Tubal ligation    . Cesarean section      x2   . Hip surgery  2009    left  . Elbow surgery    . Colonoscopy  2007    neg with Dr. [name]    REVIEW OF SYSTEMS:   Constitutional: Denies abnormal weight loss Eyes: Denies blurriness of vision Ears, nose, mouth, throat, and face: Denies mucositis or sore throat, (+) left tooth pain  Respiratory: Denies cough, dyspnea or wheezes Cardiovascular: Denies palpitation, chest  discomfort or lower extremity swelling Gastrointestinal:  Denies nausea, heartburn or change in bowel habits Skin: Denies abnormal skin rashes Lymphatics: Denies new lymphadenopathy or easy bruising Neurological:Denies numbness, tingling or new weaknesses Behavioral/Psych: Mood is stable, no new changes  All other systems were reviewed with the patient and are negative.  PHYSICAL EXAMINATION: ECOG PERFORMANCE STATUS: 0  Blood pressure 121/69, pulse 78, temperature 97.5 F (36.4 C), temperature source Oral, resp. rate 18, height 5' 5"  (1.651 m), weight 126 lb 12.8 oz (57.516 kg), SpO2 97 %.  GENERAL:alert, no distress and comfortable; easily mobile to exam table; kyphosis, moderate.  SKIN: skin color, texture, turgor are normal, no rashes or significant lesions; Abrasion well healed on RLE.  EYES: normal, Conjunctiva are pink, 1 tear/injectionof blood vessel left eye in sclera. OROPHARYNX:no exudate, no erythema and lips, buccal mucosa, and tongue normal, no ONJ NECK: supple, thyroid normal size, non-tender, without nodularity; nodules cervical bilaterally.  LYMPH:  no palpable lymphadenopathy in the cervical,  axillary or supraclavicular LUNGS: crackles at the bases bilaterally with normal breathing effort, no wheezes or rhonchi HEART: regular rate & rhythm and no murmurs and no lower extremity edema ABDOMEN:abdomen soft, non-tender and normal bowel sounds Musculoskeletal:no cyanosis of digits and no clubbing  NEURO: alert & oriented x 3 with fluent speech, no focal motor/sensory deficits  Labs:  CBC Latest Ref Rng 07/21/2014 06/23/2014 05/12/2014  WBC 3.9 - 10.3 10e3/uL 2.4(L) 2.6(L) 2.8(L)  Hemoglobin 11.6 - 15.9 g/dL 11.3(L) 10.5(L) 10.6(L)  Hematocrit 34.8 - 46.6 % 35.0 32.3(L) 32.1(L)  Platelets 145 - 400 10e3/uL 168 217 165    CMP Latest Ref Rng 07/21/2014 06/23/2014 05/12/2014  Glucose 70 - 140 mg/dl 81 103 90  BUN 7.0 - 26.0 mg/dL 14.9 16.2 16.2  Creatinine 0.6 - 1.1 mg/dL 0.8  0.8 0.8  Sodium 136 - 145 mEq/L 141 141 139  Potassium 3.5 - 5.1 mEq/L 3.4(L) 3.5 3.8  Chloride 98 - 107 mEq/L - - -  CO2 22 - 29 mEq/L 27 26 25   Calcium 8.4 - 10.4 mg/dL 9.1 9.3 9.0  Total Protein 6.4 - 8.3 g/dL 7.2 6.9 6.9  Total Bilirubin 0.20 - 1.20 mg/dL 0.73 0.55 0.77  Alkaline Phos 40 - 150 U/L 64 87 55  AST 5 - 34 U/L 27 28 27   ALT 0 - 55 U/L 20 28 19    SPEP & IFE with QIG  Status: Finalresult Visible to patient:  MyChart Nextappt: None Dx:  Multiple myeloma              Ref Range 4wk ago  29moago  344mogo  60m457moo     IgG (Immunoglobin G), Serum 690 - 1700 mg/dL 1250 1310 1350 1360    IgA 69 - 380 mg/dL 132 113 110 113    IgM, Serum 52 - 322 mg/dL 36 (L) 32 (L) 29 (L) 43 (L)    Immunofix Electr Int  * *CM  *CM   Comments: Monoclonal IgG kappa protein is present.Area of slightly restricted mobility in the IgG and Lambda lanes.Suggest repeat in 6-8 months, if clinically indicated. Reviewed by MarFrancis Gainesmmarappallil MD (Electronic Signature on File)    Total Protein, Serum Electrophoresis 6.0 - 8.3 g/dL 6.4 6.6  6.6    Albumin ELP 55.8 - 66.1 % 56.7 58.0  55.4 (L)    Alpha-1-Globulin 2.9 - 4.9 % 5.0 (H) 4.5  6.7 (H)    Alpha-2-Globulin 7.1 - 11.8 % 8.7 8.5  8.9    Beta Globulin 4.7 - 7.2 % 6.0 5.6  5.8    Beta 2 3.2 - 6.5 % 4.1 4.0  3.2    Gamma Globulin 11.1 - 18.8 % 19.5 (H) 19.4 (H)  20.0 (H)    M-Spike, % g/dL 0.45 0.50  0.50    SPE Interp.  * *CM  *CM      Kappa/lambda light chains  Status: Finalresult Visible to patient:  MyChart Nextappt: None Dx:  Multiple myeloma              Ref Range 4wk ago  46mo79mo  57mo 28mo 60mo a1mo   Kappa free light chain 0.33 - 1.94 mg/dL 4.52 (H) 2.09 (H) 3.50 (H) 3.38 (H)    Lambda Free Lght Chn 0.57 - 2.63 mg/dL 2.62 1.77 3.36 (H) 3.87 (H)    Kappa:Lambda Ratio 0.26 - 1.65  1.73 (H) 1.18 1.04 0.87         RADIOGRAPHIC STUDIES:  None  ASSESSMENT: Shannon Rush 74 y.o. female with a history of Multiple Myeloma s/p autotransplant in August 2014 now on maintenence low dose Revlimid therapy  PLAN:   1. IgG kappa Multiple myeloma s/p Auto SCT, intermediate risk.  --MM markers demonstrate a very good response (M protein baseline 2.6, down to 0.28 after induction [place]emo and transplant, stable around 0.5 since July 2015,  Most recent 0.45)  - Acyclovir has been stopped after 1 year of SCT.   -- She started revlimid 5 mg daily for maintenance on 09/02/2013.   Decreased to 2.5 daily for 3 weeks on and then one week off on 12/20/2013.  We'll continue the current dose, given her stable disease.  -- We restarted zometa 3.5 mg q 4 weeks on 06/25/2013. She will receive an infusion today and plan for 1.5  year (monthly) then every 3 months. Due to the concern of her left tooth infection and pending extraction, I'll hold Zometa today. If necrotic jaw is ruled out, I will start Zometa or months after her tooth extraction. - She is also following closely with [facility] transplant team.  --vaccine are uptodate  2. Left lower premolar tooth infection -I spoke with her dentist Dr. Deanna Rush today. She would like to try a course of antibiotics, then consider tooth extraction next months when she is back from [place].  -I'll hold Zometa today and the next months. -She is not neutropenic, I do not think she needs to hold her Revlimid.   3. Pancytopenia due to chemotherapy. --   Her blood counts are stable   3. Osteoporosis -recently repeated bone density scan showed worsening osteoporosis, will continue zometa monthly for additional 6 month then change to every 3 months, but will hold a temporary for her dental procedure.   Plan -RTC in one month with labs  -Hold Zometa today and the next months.  All questions were answered. The patient knows to call the clinic with any problems, questions or concerns. We can certainly see the patient much sooner if necessary.  I spent  15 minutes counseling the patient face to face. The total time spent in the appointment was 25 minutes.   [name]  07/21/2014

## 2014-07-22 ENCOUNTER — Telehealth: Payer: Self-pay | Admitting: *Deleted

## 2014-07-22 DIAGNOSIS — H2512 Age-related nuclear cataract, left eye: Secondary | ICD-10-CM | POA: Diagnosis not present

## 2014-07-22 NOTE — Telephone Encounter (Signed)
Received vm call from pt stating that she saw Dr Burr Medico yest & dentist Sharyn Lull Montiger? & she must have misquoted.  She states she does not have osteonecrosis & she was inaccurate.  She does have an infected tooth & DDS wants to put her on an ATB & will check with Dr Burr Medico to see if this is OK strategy. Tried to reach pt & unable to do so.  Will leave message for Dr Burr Medico for Monday

## 2014-07-25 LAB — SPEP & IFE WITH QIG
Albumin ELP: 58.2 % (ref 55.8–66.1)
Alpha-1-Globulin: 4.4 % (ref 2.9–4.9)
Alpha-2-Globulin: 8.7 % (ref 7.1–11.8)
Beta 2: 4.1 % (ref 3.2–6.5)
Beta Globulin: 5.8 % (ref 4.7–7.2)
Gamma Globulin: 18.8 % (ref 11.1–18.8)
IgA: 126 mg/dL (ref 69–380)
IgG (Immunoglobin G), Serum: 1290 mg/dL (ref 690–1700)
IgM, Serum: 33 mg/dL — ABNORMAL LOW (ref 52–322)
M-Spike, %: 0.5 g/dL
Total Protein, Serum Electrophoresis: 6.8 g/dL (ref 6.0–8.3)

## 2014-07-25 LAB — KAPPA/LAMBDA LIGHT CHAINS
Kappa free light chain: 2.92 mg/dL — ABNORMAL HIGH (ref 0.33–1.94)
Kappa:Lambda Ratio: 1.26 (ref 0.26–1.65)
Lambda Free Lght Chn: 2.32 mg/dL (ref 0.57–2.63)

## 2014-07-28 DIAGNOSIS — H2512 Age-related nuclear cataract, left eye: Secondary | ICD-10-CM | POA: Diagnosis not present

## 2014-08-01 ENCOUNTER — Other Ambulatory Visit: Payer: Self-pay | Admitting: Hematology and Oncology

## 2014-08-01 NOTE — Telephone Encounter (Signed)
THIS REFILL REQUEST WAS GIVEN TO DR.FENG'S NURSE, MYRTLE HARDIN,RN.

## 2014-08-03 ENCOUNTER — Other Ambulatory Visit: Payer: Self-pay | Admitting: *Deleted

## 2014-08-03 DIAGNOSIS — C9000 Multiple myeloma not having achieved remission: Secondary | ICD-10-CM

## 2014-08-03 MED ORDER — LENALIDOMIDE 2.5 MG PO CAPS: ORAL_CAPSULE | ORAL | Status: DC

## 2014-08-04 ENCOUNTER — Telehealth: Payer: Self-pay | Admitting: Family Medicine

## 2014-08-04 DIAGNOSIS — M6281 Muscle weakness (generalized): Secondary | ICD-10-CM | POA: Diagnosis not present

## 2014-08-04 NOTE — Telephone Encounter (Signed)
Pt called and would like Dr. Andria Rush to call in to the pharmacy Malaria serum for herself and her husband since they are going to [place].

## 2014-08-04 NOTE — Telephone Encounter (Signed)
Tried calling, no answer.  I will try again next week.  Need to discuss how long they will be there and when they leave in order to choose the best drug.

## 2014-08-08 ENCOUNTER — Inpatient Hospital Stay (HOSPITAL_COMMUNITY)
Admission: EM | Admit: 2014-08-08 | Discharge: 2014-08-13 | DRG: 871 | Disposition: A | Discharge status: Home / Self Care | Payer: Medicare Other | Attending: Internal Medicine | Admitting: Internal Medicine

## 2014-08-08 ENCOUNTER — Encounter (HOSPITAL_COMMUNITY): Payer: Self-pay | Admitting: Emergency Medicine

## 2014-08-08 ENCOUNTER — Emergency Department (HOSPITAL_COMMUNITY): Payer: Medicare Other

## 2014-08-08 DIAGNOSIS — D709 Neutropenia, unspecified: Secondary | ICD-10-CM | POA: Insufficient documentation

## 2014-08-08 DIAGNOSIS — R509 Fever, unspecified: Secondary | ICD-10-CM | POA: Diagnosis not present

## 2014-08-08 DIAGNOSIS — J181 Lobar pneumonia, unspecified organism: Secondary | ICD-10-CM | POA: Diagnosis not present

## 2014-08-08 DIAGNOSIS — Z9484 Stem cells transplant status: Secondary | ICD-10-CM | POA: Diagnosis not present

## 2014-08-08 DIAGNOSIS — I10 Essential (primary) hypertension: Secondary | ICD-10-CM | POA: Diagnosis not present

## 2014-08-08 DIAGNOSIS — A419 Sepsis, unspecified organism: Principal | ICD-10-CM | POA: Diagnosis present

## 2014-08-08 DIAGNOSIS — E785 Hyperlipidemia, unspecified: Secondary | ICD-10-CM | POA: Diagnosis present

## 2014-08-08 DIAGNOSIS — J189 Pneumonia, unspecified organism: Secondary | ICD-10-CM | POA: Diagnosis not present

## 2014-08-08 DIAGNOSIS — T7840XA Allergy, unspecified, initial encounter: Secondary | ICD-10-CM | POA: Diagnosis not present

## 2014-08-08 DIAGNOSIS — Z7982 Long term (current) use of aspirin: Secondary | ICD-10-CM

## 2014-08-08 DIAGNOSIS — I252 Old myocardial infarction: Secondary | ICD-10-CM | POA: Diagnosis not present

## 2014-08-08 DIAGNOSIS — Y95 Nosocomial condition: Secondary | ICD-10-CM | POA: Diagnosis present

## 2014-08-08 DIAGNOSIS — R5081 Fever presenting with conditions classified elsewhere: Secondary | ICD-10-CM

## 2014-08-08 DIAGNOSIS — D539 Nutritional anemia, unspecified: Secondary | ICD-10-CM | POA: Diagnosis present

## 2014-08-08 DIAGNOSIS — L270 Generalized skin eruption due to drugs and medicaments taken internally: Secondary | ICD-10-CM | POA: Diagnosis not present

## 2014-08-08 DIAGNOSIS — T451X5A Adverse effect of antineoplastic and immunosuppressive drugs, initial encounter: Secondary | ICD-10-CM | POA: Diagnosis not present

## 2014-08-08 DIAGNOSIS — D61810 Antineoplastic chemotherapy induced pancytopenia: Secondary | ICD-10-CM | POA: Diagnosis not present

## 2014-08-08 DIAGNOSIS — B974 Respiratory syncytial virus as the cause of diseases classified elsewhere: Secondary | ICD-10-CM | POA: Diagnosis present

## 2014-08-08 DIAGNOSIS — M7989 Other specified soft tissue disorders: Secondary | ICD-10-CM | POA: Diagnosis not present

## 2014-08-08 DIAGNOSIS — L271 Localized skin eruption due to drugs and medicaments taken internally: Secondary | ICD-10-CM | POA: Diagnosis not present

## 2014-08-08 DIAGNOSIS — J13 Pneumonia due to Streptococcus pneumoniae: Secondary | ICD-10-CM | POA: Diagnosis not present

## 2014-08-08 DIAGNOSIS — R110 Nausea: Secondary | ICD-10-CM | POA: Diagnosis not present

## 2014-08-08 DIAGNOSIS — C9000 Multiple myeloma not having achieved remission: Secondary | ICD-10-CM | POA: Diagnosis not present

## 2014-08-08 DIAGNOSIS — I2510 Atherosclerotic heart disease of native coronary artery without angina pectoris: Secondary | ICD-10-CM | POA: Diagnosis present

## 2014-08-08 DIAGNOSIS — E876 Hypokalemia: Secondary | ICD-10-CM | POA: Diagnosis present

## 2014-08-08 DIAGNOSIS — M810 Age-related osteoporosis without current pathological fracture: Secondary | ICD-10-CM | POA: Diagnosis present

## 2014-08-08 DIAGNOSIS — R0602 Shortness of breath: Secondary | ICD-10-CM | POA: Diagnosis present

## 2014-08-08 DIAGNOSIS — T360X5A Adverse effect of penicillins, initial encounter: Secondary | ICD-10-CM | POA: Diagnosis not present

## 2014-08-08 DIAGNOSIS — J984 Other disorders of lung: Secondary | ICD-10-CM | POA: Diagnosis not present

## 2014-08-08 DIAGNOSIS — D61818 Other pancytopenia: Secondary | ICD-10-CM | POA: Diagnosis not present

## 2014-08-08 LAB — URINALYSIS, ROUTINE W REFLEX MICROSCOPIC
Bilirubin Urine: NEGATIVE
Glucose, UA: NEGATIVE mg/dL
HGB URINE DIPSTICK: NEGATIVE
KETONES UR: NEGATIVE mg/dL
Leukocytes, UA: NEGATIVE
Nitrite: NEGATIVE
PH: 5.5 (ref 5.0–8.0)
Protein, ur: NEGATIVE mg/dL
SPECIFIC GRAVITY, URINE: 1.015 (ref 1.005–1.030)
Urobilinogen, UA: 0.2 mg/dL (ref 0.0–1.0)

## 2014-08-08 LAB — COMPREHENSIVE METABOLIC PANEL
ALK PHOS: 72 U/L (ref 39–117)
ALT: 27 U/L (ref 0–35)
ANION GAP: 11 (ref 5–15)
AST: 36 U/L (ref 0–37)
Albumin: 4.3 g/dL (ref 3.5–5.2)
BUN: 19 mg/dL (ref 6–23)
CHLORIDE: 106 mmol/L (ref 96–112)
CO2: 23 mmol/L (ref 19–32)
CREATININE: 0.78 mg/dL (ref 0.50–1.10)
Calcium: 9.7 mg/dL (ref 8.4–10.5)
GFR calc Af Amer: >90 mL/min (ref >90)
GFR, EST NON AFRICAN AMERICAN: 82 mL/min — AB (ref >90)
Glucose, Bld: 119 mg/dL — ABNORMAL HIGH (ref 70–99)
POTASSIUM: 3.8 mmol/L (ref 3.5–5.1)
Sodium: 140 mmol/L (ref 135–145)
TOTAL PROTEIN: 7.4 g/dL (ref 6.0–8.3)
Total Bilirubin: 0.9 mg/dL (ref 0.3–1.2)

## 2014-08-08 LAB — CBC WITH DIFFERENTIAL/PLATELET
BASOS ABS: 0 10*3/uL (ref 0.0–0.1)
Basophils Relative: 2 % — ABNORMAL HIGH (ref 0–1)
EOS PCT: 1 % (ref 0–5)
Eosinophils Absolute: 0 10*3/uL (ref 0.0–0.7)
HCT: 33.6 % — ABNORMAL LOW (ref 36.0–46.0)
HEMOGLOBIN: 11.3 g/dL — AB (ref 12.0–15.0)
Lymphocytes Relative: 22 % (ref 12–46)
Lymphs Abs: 0.4 10*3/uL — ABNORMAL LOW (ref 0.7–4.0)
MCH: 35.1 pg — AB (ref 26.0–34.0)
MCHC: 33.6 g/dL (ref 30.0–36.0)
MCV: 104.3 fL — ABNORMAL HIGH (ref 78.0–100.0)
Monocytes Absolute: 0.3 10*3/uL (ref 0.1–1.0)
Monocytes Relative: 17 % — ABNORMAL HIGH (ref 3–12)
Neutro Abs: 1 10*3/uL — ABNORMAL LOW (ref 1.7–7.7)
Neutrophils Relative %: 58 % (ref 43–77)
PLATELETS: 162 10*3/uL (ref 150–400)
RBC: 3.22 MIL/uL — ABNORMAL LOW (ref 3.87–5.11)
RDW: 15.3 % (ref 11.5–15.5)
WBC: 1.7 10*3/uL — AB (ref 4.0–10.5)

## 2014-08-08 MED ORDER — VANCOMYCIN HCL IN DEXTROSE 1-5 GM/200ML-% IV SOLN
1000.0000 mg | Freq: Once | INTRAVENOUS | Status: AC
  Administered 2014-08-08: 1000 mg via INTRAVENOUS
  Filled 2014-08-08: qty 200

## 2014-08-08 MED ORDER — SODIUM CHLORIDE 0.9 % IV SOLN
INTRAVENOUS | Status: AC
  Administered 2014-08-09: 02:00:00 via INTRAVENOUS

## 2014-08-08 MED ORDER — DEXTROSE 5 % IV SOLN
1.0000 g | Freq: Once | INTRAVENOUS | Status: AC
  Administered 2014-08-08: 1 g via INTRAVENOUS
  Filled 2014-08-08: qty 1

## 2014-08-08 MED ORDER — ACETAMINOPHEN 325 MG PO TABS
650.0000 mg | ORAL_TABLET | Freq: Once | ORAL | Status: AC
  Administered 2014-08-08: 650 mg via ORAL
  Filled 2014-08-08: qty 2

## 2014-08-08 MED ORDER — SODIUM CHLORIDE 0.9 % IV BOLUS (SEPSIS)
1000.0000 mL | Freq: Once | INTRAVENOUS | Status: AC
  Administered 2014-08-08: 1000 mL via INTRAVENOUS

## 2014-08-08 MED ORDER — MEFLOQUINE HCL 250 MG PO TABS: 250.0000 mg | ORAL_TABLET | ORAL | Status: DC

## 2014-08-08 NOTE — ED Notes (Signed)
Pt reports having nausea today along with a fever of 102.0 at home. Pt states she is aching with chills. Pt is alert and oriented.

## 2014-08-08 NOTE — Telephone Encounter (Signed)
Patient leaving 2/2 and returning 2/26.  Will not be in malaria prone region for the first week.  Per CDC website: Recommended chemoprophylaxis: Atovaquone-proguanil, doxycycline, or mefloquine.  Per patient, the health department recommended "the medicine you start 2 weeks before travel" which would be mefloquine.   Verified pharmacy and that husband has no allergies or health problems.

## 2014-08-08 NOTE — ED Provider Notes (Signed)
CSN: [id number]     Arrival date & time 08/08/14  1951 History   First MD Initiated Contact with Patient 08/08/14 2034     Chief Complaint  Patient presents with  . Fever     (Consider location/radiation/quality/duration/timing/severity/associated sxs/prior Treatment) HPI  Pt presenting with c/o fever/chills as well as nausea.  Pt has hx of multiple myeloma currently on chemotherapy.  Last treatment approx 7 days ago.  She denies cough, no abdominal pain.  She denies having any sick contacts.  Has not had any treatment prior to arrival.  No diarrhea, no dysuria or sore throat. There are no other associated systemic symptoms, there are no other alleviating or modifying factors.   Past Medical History  Diagnosis Date  . Multiple myeloma 07/01/2012  . Unspecified deficiency anemia   . OSTEOPOROSIS 06/11/2010    Multiple compression fractures; and spontaneous fracture of sternum Qualifier: Diagnosis of  By: [name], [name]     . 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 Cord compression 07/13/12    MRI- diffuse myeloma involvement of T-L spine  . History of radiation therapy 07/13/12-07/27/12    spinal cord compression T3-T10,left scapula  . CAD (coronary artery disease)     a. inf STEMI (in [place] >>> [facility]) >>> LHC (8/15- [place]):  pRCA 60%, mid to dist RCA 50% >>> med rx  . Hx of echocardiogram     Echo (03/02/14-done at [facility]):  Normal LVEF without significant regional wall motion abnormalities. Mild    Past Surgical History  Procedure Laterality Date  . Appendectomy    . Tubal ligation    . Cesarean section      x2   . Hip surgery  2009    left  . Elbow surgery    . Colonoscopy  2007    neg with Dr. [name]   Family History  Problem Relation Age of Onset  . Glaucoma Mother   . Heart disease Mother   . Heart disease Father   . Cancer Brother     stage IV prostate CA   History  Substance Use Topics  . Smoking  status: Never Smoker   . Smokeless tobacco: Never Used  . Alcohol Use: 3.5 oz/week    7 drink(s) per week   OB History    No data available     Review of Systems  ROS reviewed and all otherwise negative except for mentioned in HPI    Allergies  Zithromax and Ciprofloxacin  Home Medications   Prior to Admission medications   Medication Sig Start Date End Date Taking? Authorizing Provider  aspirin 81 MG EC tablet Take 81 mg by mouth every other day.  03/03/14  Yes Historical Provider, MD  atorvastatin (LIPITOR) 40 MG tablet Take 1 tablet (40 mg total) by mouth daily. 05/02/14  Yes Josue Hector, MD  calcium carbonate (OS-CAL) 600 MG TABS tablet Take 600 mg by mouth daily.   Yes Historical Provider, MD  clopidogrel (PLAVIX) 75 MG tablet Take 1 tablet (75 mg total) by mouth daily. 05/02/14  Yes Josue Hector, MD  DUREZOL 0.05 % EMUL Place 1 drop into the left eye daily.  06/16/14  Yes Historical Provider, MD  guaiFENesin (ROBITUSSIN) 100 MG/5ML liquid Take 200 mg by mouth 3 (three) times daily as needed for cough (cough).   Yes Historical Provider, MD  metoprolol succinate (TOPROL-XL) 25 MG 24 hr tablet Take 1  tablet (25 mg total) by mouth daily. 05/02/14  Yes Josue Hector, MD  PROLENSA 0.07 % SOLN Place 1 drop into the left eye 2 (two) times daily.  06/16/14  Yes Historical Provider, MD  vitamin D, CHOLECALCIFEROL, 400 UNITS tablet Take 400 Units by mouth daily.   Yes Historical Provider, MD  lenalidomide (REVLIMID) 2.5 MG capsule Take 1 tablet (2.5 mg daily) for 21 days ,  and then off for 7 days. 08/03/14   [name], MD  mefloquine (LARIAM) 250 MG tablet Take 1 tablet (250 mg total) by mouth every 7 (seven) days. Self and husband start now, continue for 4 weeks after return 08/08/14   [name], MD  morphine (MSIR) 15 MG tablet Take 1 tablet (15 mg total) by mouth every 6 (six) hours as needed for severe pain. 02/10/14   [name], MD  ondansetron (ZOFRAN) 4 MG tablet Take  4 mg by mouth every 8 (eight) hours as needed for nausea or vomiting.    Historical Provider, MD   BP 117/48 mmHg  Pulse 104  Temp(Src) 102.3 F (39.1 C) (Oral)  Resp 16  Ht _0  (1.702 m)  Wt 128 lb 12 oz (58.4 kg)  BMI 20.16 kg/m2  SpO2 92%  Vitals reviewed Physical Exam  Physical Examination: General appearance - alert, well appearing, and in no distress Mental status - alert, oriented to person, place, and time Eyes - no conjunctival injection, no scleral icterus Mouth - mucous membranes moist, pharynx normal without lesions Chest - clear to auscultation, no wheezes, rales or rhonchi, symmetric air entry Heart - normal rate, regular rhythm, normal S1, S2, no murmurs, rubs, clicks or gallops Abdomen - soft, nontender, nondistended, no masses or organomegaly Extremities - peripheral pulses normal, no pedal edema, no clubbing or cyanosis Skin - normal coloration and turgor, no rashes  ED Course  Procedures (including critical care time)  11:42 PM d/w Dr. Blaine Hamper, triad hospitalist, pt to go to telemetry bed.  Labs Review Labs Reviewed  CBC WITH DIFFERENTIAL/PLATELET - Abnormal; Notable for the following:    WBC 1.7 (*)    RBC 3.22 (*)    Hemoglobin 11.3 (*)    HCT 33.6 (*)    MCV 104.3 (*)    MCH 35.1 (*)    Neutro Abs 1.0 (*)    Lymphs Abs 0.4 (*)    Monocytes Relative 17 (*)    Basophils Relative 2 (*)    All other components within normal limits  COMPREHENSIVE METABOLIC PANEL - Abnormal; Notable for the following:    Glucose, Bld 119 (*)    GFR calc non Af Amer 82 (*)    All other components within normal limits  STREP PNEUMONIAE URINARY ANTIGEN - Abnormal; Notable for the following:    Strep Pneumo Urinary Antigen POSITIVE (*)    All other components within normal limits  CULTURE, EXPECTORATED SPUTUM-ASSESSMENT  CULTURE, BLOOD (ROUTINE X 2)  CULTURE, BLOOD (ROUTINE X 2)  URINE CULTURE  RESPIRATORY VIRUS PANEL  CULTURE, RESPIRATORY (NON-EXPECTORATED)   URINALYSIS, ROUTINE W REFLEX MICROSCOPIC  LACTIC ACID, PLASMA  PROTIME-INR  LEGIONELLA ANTIGEN, URINE  CBC WITH DIFFERENTIAL/PLATELET  BASIC METABOLIC PANEL    Imaging Review Dg Chest 2 View  08/08/2014   CLINICAL DATA:  Fever. Nausea. Aches and pains. Onset of symptoms at 3 p.m. today.  EXAM: CHEST  2 VIEW  COMPARISON:  Multiple priors dating back to 2014.  FINDINGS: Focal consolidation is present at the RIGHT lung  base, overlying the RIGHT breast shadow. This likely represents a focus of pneumonia or aspiration pneumonitis. Thoracic kyphosis is present with multiple chronic thoracic spine compression fractures. Cardiopericardial silhouette appears within normal limits. Bilateral pleural apical scarring. Mediastinal contours are also within normal limits. Chronic deformity of the sternum compatible with an old fracture.  IMPRESSION: Dense RIGHT lower lobe consolidation, most compatible with pneumonia. Followup in 4-6 weeks to ensure radiographic clearing and exclude an underlying lesion is recommended.   Electronically Signed   By: Dereck Ligas M.D.   On: 08/08/2014 21:40     EKG Interpretation None      MDM   Final diagnoses:  None  febrile neutropenia Healthcare associated pneumonia  Pt with hx of multiple myeloma with neutropenia presenting with fever and pneumonia.  Blood and urine cultures obtained, pt started on vanc and cefepime.  Given IV fluids.  Pt admitted to triad for further management.      Threasa Beards, MD 08/09/14 [phone]

## 2014-08-09 DIAGNOSIS — Z9484 Stem cells transplant status: Secondary | ICD-10-CM

## 2014-08-09 DIAGNOSIS — D539 Nutritional anemia, unspecified: Secondary | ICD-10-CM

## 2014-08-09 DIAGNOSIS — C9000 Multiple myeloma not having achieved remission: Secondary | ICD-10-CM

## 2014-08-09 DIAGNOSIS — I252 Old myocardial infarction: Secondary | ICD-10-CM

## 2014-08-09 DIAGNOSIS — J189 Pneumonia, unspecified organism: Secondary | ICD-10-CM

## 2014-08-09 DIAGNOSIS — A419 Sepsis, unspecified organism: Secondary | ICD-10-CM | POA: Insufficient documentation

## 2014-08-09 LAB — PROTIME-INR
INR: 1.11 (ref 0.00–1.49)
Prothrombin Time: 14.4 seconds (ref 11.6–15.2)

## 2014-08-09 LAB — EXPECTORATED SPUTUM ASSESSMENT W GRAM STAIN, RFLX TO RESP C

## 2014-08-09 LAB — EXPECTORATED SPUTUM ASSESSMENT W REFEX TO RESP CULTURE

## 2014-08-09 LAB — LACTIC ACID, PLASMA: Lactic Acid, Venous: 1.3 mmol/L (ref 0.5–2.0)

## 2014-08-09 LAB — STREP PNEUMONIAE URINARY ANTIGEN: Strep Pneumo Urinary Antigen: POSITIVE — AB

## 2014-08-09 MED ORDER — ONDANSETRON HCL 4 MG PO TABS: 4.0000 mg | ORAL_TABLET | Freq: Three times a day (TID) | ORAL | Status: DC | PRN

## 2014-08-09 MED ORDER — ATORVASTATIN CALCIUM 40 MG PO TABS
40.0000 mg | ORAL_TABLET | Freq: Every day | ORAL | Status: DC
  Administered 2014-08-09 – 2014-08-12 (×4): 40 mg via ORAL
  Filled 2014-08-09 (×6): qty 1

## 2014-08-09 MED ORDER — ENSURE COMPLETE PO LIQD
237.0000 mL | Freq: Two times a day (BID) | ORAL | Status: DC
  Administered 2014-08-09 – 2014-08-13 (×8): 237 mL via ORAL

## 2014-08-09 MED ORDER — VANCOMYCIN HCL 500 MG IV SOLR
500.0000 mg | Freq: Two times a day (BID) | INTRAVENOUS | Status: DC
  Administered 2014-08-09 – 2014-08-10 (×4): 500 mg via INTRAVENOUS
  Filled 2014-08-09 (×5): qty 500

## 2014-08-09 MED ORDER — CHOLECALCIFEROL 10 MCG (400 UNIT) PO TABS
400.0000 [IU] | ORAL_TABLET | Freq: Every day | ORAL | Status: DC
Start: 2014-08-09 — End: 2014-08-13
  Administered 2014-08-09 – 2014-08-13 (×5): 400 [IU] via ORAL
  Filled 2014-08-09 (×5): qty 1

## 2014-08-09 MED ORDER — MEFLOQUINE HCL 250 MG PO TABS: 250.0000 mg | ORAL_TABLET | ORAL | Status: DC

## 2014-08-09 MED ORDER — SODIUM CHLORIDE 0.9 % IV SOLN: INTRAVENOUS | Status: DC

## 2014-08-09 MED ORDER — KETOROLAC TROMETHAMINE 0.5 % OP SOLN
1.0000 [drp] | Freq: Four times a day (QID) | OPHTHALMIC | Status: DC
  Filled 2014-08-09: qty 3

## 2014-08-09 MED ORDER — ALBUTEROL SULFATE (2.5 MG/3ML) 0.083% IN NEBU
2.5000 mg | INHALATION_SOLUTION | Freq: Three times a day (TID) | RESPIRATORY_TRACT | Status: DC
  Administered 2014-08-10 – 2014-08-11 (×4): 2.5 mg via RESPIRATORY_TRACT
  Filled 2014-08-09 (×4): qty 3

## 2014-08-09 MED ORDER — CEFEPIME HCL 2 G IJ SOLR
2.0000 g | Freq: Three times a day (TID) | INTRAMUSCULAR | Status: DC
  Administered 2014-08-09 – 2014-08-10 (×5): 2 g via INTRAVENOUS
  Filled 2014-08-09 (×7): qty 2

## 2014-08-09 MED ORDER — MORPHINE SULFATE 15 MG PO TABS: 15.0000 mg | ORAL_TABLET | Freq: Four times a day (QID) | ORAL | Status: DC | PRN

## 2014-08-09 MED ORDER — METOPROLOL SUCCINATE ER 25 MG PO TB24
25.0000 mg | ORAL_TABLET | Freq: Every day | ORAL | Status: DC
  Administered 2014-08-09 – 2014-08-13 (×5): 25 mg via ORAL
  Filled 2014-08-09 (×5): qty 1

## 2014-08-09 MED ORDER — DIFLUPREDNATE 0.05 % OP EMUL
1.0000 [drp] | Freq: Every day | OPHTHALMIC | Status: DC
  Administered 2014-08-10 – 2014-08-13 (×4): 1 [drp] via OPHTHALMIC

## 2014-08-09 MED ORDER — SODIUM CHLORIDE 0.9 % IV BOLUS (SEPSIS)
500.0000 mL | Freq: Once | INTRAVENOUS | Status: AC
  Administered 2014-08-09: 500 mL via INTRAVENOUS

## 2014-08-09 MED ORDER — ALBUTEROL SULFATE (2.5 MG/3ML) 0.083% IN NEBU
2.5000 mg | INHALATION_SOLUTION | Freq: Four times a day (QID) | RESPIRATORY_TRACT | Status: DC
  Administered 2014-08-09 (×2): 2.5 mg via RESPIRATORY_TRACT
  Filled 2014-08-09 (×2): qty 3

## 2014-08-09 MED ORDER — ALBUTEROL SULFATE (2.5 MG/3ML) 0.083% IN NEBU: 2.5000 mg | INHALATION_SOLUTION | RESPIRATORY_TRACT | Status: DC | PRN

## 2014-08-09 MED ORDER — SODIUM CHLORIDE 0.9 % IV SOLN
INTRAVENOUS | Status: DC
  Administered 2014-08-09: 14:00:00 via INTRAVENOUS

## 2014-08-09 MED ORDER — ACETAMINOPHEN 325 MG PO TABS
650.0000 mg | ORAL_TABLET | Freq: Four times a day (QID) | ORAL | Status: DC | PRN
  Administered 2014-08-09 – 2014-08-10 (×5): 650 mg via ORAL
  Filled 2014-08-09 (×6): qty 2

## 2014-08-09 MED ORDER — HEPARIN SODIUM (PORCINE) 5000 UNIT/ML IJ SOLN
5000.0000 [IU] | Freq: Three times a day (TID) | INTRAMUSCULAR | Status: DC
  Administered 2014-08-09 – 2014-08-13 (×15): 5000 [IU] via SUBCUTANEOUS
  Filled 2014-08-09 (×17): qty 1

## 2014-08-09 MED ORDER — CLOPIDOGREL BISULFATE 75 MG PO TABS
75.0000 mg | ORAL_TABLET | Freq: Every day | ORAL | Status: DC
  Administered 2014-08-09 – 2014-08-13 (×5): 75 mg via ORAL
  Filled 2014-08-09 (×5): qty 1

## 2014-08-09 MED ORDER — GUAIFENESIN 100 MG/5ML PO SOLN
200.0000 mg | Freq: Three times a day (TID) | ORAL | Status: DC | PRN
  Administered 2014-08-10: 200 mg via ORAL
  Filled 2014-08-09: qty 10

## 2014-08-09 MED ORDER — CALCIUM CARBONATE 1250 (500 CA) MG PO TABS
1250.0000 mg | ORAL_TABLET | Freq: Every day | ORAL | Status: DC
  Administered 2014-08-09 – 2014-08-13 (×5): 1250 mg via ORAL
  Filled 2014-08-09 (×5): qty 1

## 2014-08-09 MED ORDER — ASPIRIN EC 81 MG PO TBEC
81.0000 mg | DELAYED_RELEASE_TABLET | ORAL | Status: DC
Start: 2014-08-09 — End: 2014-08-13
  Administered 2014-08-09 – 2014-08-13 (×3): 81 mg via ORAL
  Filled 2014-08-09 (×3): qty 1

## 2014-08-09 NOTE — H&P (Signed)
[facility] History and Physical  Shannon Rush JQB:[id number] DOB: 07/08/1941 DOA: 08/08/2014  Referring physician: ED physician PCP: [name], MD  Specialists:   Chief Complaint: Fever, productive cough, shortness of breath and mild chest pain  HPI: Shannon Rush is a 74 y.o. female with past medical history of multiple myeloma (on chemotherapy), coronary artery disease, history of stem cell transplantation in [place], who presents with fever, productive cough, shortness of breath and mild chest pain.  Patient reports that she has been having cough in the past 5 weeks. She coughs up a little greenish colored sputum. She also has mild shortness of breath and mild chest pain which is related to coughing. She has mild runny nose, but no sore throat. Today she developed fever and nausea, but no vomiting, diarrhea or abdominal pain. Patient denies abdominal pain, diarrhea, dysuria, urgency, frequency, hematuria, skin rashes or leg swelling.  Work up in the ED demonstrates right lower lobe pneumonia by chest x-ray; WBC 1.7, negative urinalysis. Patient is admitted to inpatient for further evaluation and treatment.  Review of Systems: As presented in the history of presenting illness, rest negative.  Where does patient live?  At home Can patient participate in ADLs? Yes  Allergy:  Allergies  Allergen Reactions  . Zithromax [Azithromycin]     Full body rash  . Ciprofloxacin Rash    Throwing up and stomach pain     Past Medical History  Diagnosis Date  . Multiple myeloma 07/01/2012  . Unspecified deficiency anemia   . OSTEOPOROSIS 06/11/2010    Multiple compression fractures; and spontaneous fracture of sternum Qualifier: Diagnosis of  By: [name], [name]     . 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 Cord compression 07/13/12    MRI- diffuse myeloma involvement of T-L spine  . History of radiation therapy 07/13/12-07/27/12    spinal cord compression  T3-T10,left scapula  . CAD (coronary artery disease)     a. inf STEMI (in [place] >>> [facility]) >>> LHC (8/15- [place]):  pRCA 60%, mid to dist RCA 50% >>> med rx  . Hx of echocardiogram     Echo (03/02/14-done at [facility]):  Normal LVEF without significant regional wall motion abnormalities. Mild     Past Surgical History  Procedure Laterality Date  . Appendectomy    . Tubal ligation    . Cesarean section      x2   . Hip surgery  2009    left  . Elbow surgery    . Colonoscopy  2007    neg with Dr. [name]    Social History:  reports that she has never smoked. She has never used smokeless tobacco. She reports that she drinks about 3.5 oz of alcohol per week. She reports that she does not use illicit drugs.  Family History:  Family History  Problem Relation Age of Onset  . Glaucoma Mother   . Heart disease Mother   . Heart disease Father   . Cancer Brother     stage IV prostate CA     Prior to Admission medications   Medication Sig Start Date End Date Taking? Authorizing Provider  aspirin 81 MG EC tablet Take 81 mg by mouth every other day.  03/03/14  Yes Historical Provider, MD  atorvastatin (LIPITOR) 40 MG tablet Take 1 tablet (40 mg total) by mouth daily. 05/02/14  Yes Josue Hector, MD  calcium carbonate (OS-CAL)  600 MG TABS tablet Take 600 mg by mouth daily.   Yes Historical Provider, MD  clopidogrel (PLAVIX) 75 MG tablet Take 1 tablet (75 mg total) by mouth daily. 05/02/14  Yes Josue Hector, MD  DUREZOL 0.05 % EMUL Place 1 drop into the left eye daily.  06/16/14  Yes Historical Provider, MD  guaiFENesin (ROBITUSSIN) 100 MG/5ML liquid Take 200 mg by mouth 3 (three) times daily as needed for cough (cough).   Yes Historical Provider, MD  metoprolol succinate (TOPROL-XL) 25 MG 24 hr tablet Take 1 tablet (25 mg total) by mouth daily. 05/02/14  Yes Josue Hector, MD  PROLENSA 0.07 % SOLN Place 1 drop into the left eye 2 (two)  times daily.  06/16/14  Yes Historical Provider, MD  vitamin D, CHOLECALCIFEROL, 400 UNITS tablet Take 400 Units by mouth daily.   Yes Historical Provider, MD  lenalidomide (REVLIMID) 2.5 MG capsule Take 1 tablet (2.5 mg daily) for 21 days ,  and then off for 7 days. 08/03/14   [name], MD  mefloquine (LARIAM) 250 MG tablet Take 1 tablet (250 mg total) by mouth every 7 (seven) days. Self and husband start now, continue for 4 weeks after return 08/08/14   [name], MD  morphine (MSIR) 15 MG tablet Take 1 tablet (15 mg total) by mouth every 6 (six) hours as needed for severe pain. 02/10/14   [name], MD  ondansetron (ZOFRAN) 4 MG tablet Take 4 mg by mouth every 8 (eight) hours as needed for nausea or vomiting.    Historical Provider, MD    Physical Exam: Filed Vitals:   08/08/14 2230 08/08/14 2313 08/09/14 0050 08/09/14 0059  BP: 103/49 95/52 96/48  100/53  Pulse: 97 96 97   Temp:  100.9 F (38.3 C) 99.3 F (37.4 C)   TempSrc:  Rectal Oral   Resp: 16 25 191   Height:   5' 7"  (1.702 m)   Weight:   58.4 kg (128 lb 12 oz)   SpO2: 90% 95% 93%    General: Not in acute distress HEENT:       Eyes: PERRL, EOMI, no scleral icterus       ENT: No discharge from the ears and nose, no pharynx injection, no tonsillar enlargement.        Neck: No JVD, no bruit, no mass felt. Cardiac: S1/S2, RRR, No murmurs, No gallops or rubs Pulm: has rales bilaterally. Abd: Soft, nondistended, nontender, no rebound pain, no organomegaly, BS present Ext: No edema bilaterally. 2+DP/PT pulse bilaterally Musculoskeletal: No joint deformities, erythema, or stiffness, ROM full Skin: No rashes.  Neuro: Alert and oriented X3, cranial nerves II-XII grossly intact, muscle strength 5/5 in all extremeties, sensation to light touch intact.  Psych: Patient is not psychotic, no suicidal or hemocidal ideation.  Labs on Admission:  Basic Metabolic Panel:  Recent Labs Lab 08/08/14 2025  NA 140  K 3.8  CL 106   CO2 23  GLUCOSE 119*  BUN 19  CREATININE 0.78  CALCIUM 9.7   Liver Function Tests:  Recent Labs Lab 08/08/14 2025  AST 36  ALT 27  ALKPHOS 72  BILITOT 0.9  PROT 7.4  ALBUMIN 4.3   No results for input(s): LIPASE, AMYLASE in the last 168 hours. No results for input(s): AMMONIA in the last 168 hours. CBC:  Recent Labs Lab 08/08/14 2025  WBC 1.7*  NEUTROABS 1.0*  HGB 11.3*  HCT 33.6*  MCV 104.3*  PLT 162  Cardiac Enzymes: No results for input(s): CKTOTAL, CKMB, CKMBINDEX, TROPONINI in the last 168 hours.  BNP (last 3 results) No results for input(s): PROBNP in the last 8760 hours. CBG: No results for input(s): GLUCAP in the last 168 hours.  Radiological Exams on Admission: Dg Chest 2 View  08/08/2014   CLINICAL DATA:  Fever. Nausea. Aches and pains. Onset of symptoms at 3 p.m. today.  EXAM: CHEST  2 VIEW  COMPARISON:  Multiple priors dating back to 2014.  FINDINGS: Focal consolidation is present at the RIGHT lung base, overlying the RIGHT breast shadow. This likely represents a focus of pneumonia or aspiration pneumonitis. Thoracic kyphosis is present with multiple chronic thoracic spine compression fractures. Cardiopericardial silhouette appears within normal limits. Bilateral pleural apical scarring. Mediastinal contours are also within normal limits. Chronic deformity of the sternum compatible with an old fracture.  IMPRESSION: Dense RIGHT lower lobe consolidation, most compatible with pneumonia. Followup in 4-6 weeks to ensure radiographic clearing and exclude an underlying lesion is recommended.   Electronically Signed   By: Dereck Ligas M.D.   On: 08/08/2014 21:40    Assessment/Plan Principal Problem:   HCAP (healthcare-associated pneumonia) Active Problems:   Anemia, macrocytic   Osteoporosis   Multiple myeloma   History of peripheral stem cell transplant   History of ST elevation myocardial infarction (STEMI)   Coronary atherosclerosis of native  coronary artery   Sepsis   Pneumonia and sepsis Patient's presentations of productive cough, fever, chest pain, shortness of breath are consistent with pneumonia. Patient is currently receiving chemotherapy, and is immunosuppressed. I will treat the patient as HCAP. Patient is mildly septic with borderline neutropenia (WBC 1.7).  - Will admit to Telemetry Bed - IV Vancomycin and cefepime  - Urine legionella and S. pneumococcal antigen - IVF: 125 cc/h - Follow up blood culture x2, sputum culture and respiratory virus panel  MM and Pancytopenia due to chemotherapy: s/p of stem cell transplantation (autotransplant) in August 2014 n at [facility]. She has been followed by [facility] Last seen was 02/2014. She is also followed by dr. Krista Blue here. Last seen was 3 weeks ago. She has an appointment on next Thursday. Per dr. Rhea Belton note on 06/23/14, MM markers demonstrate a very good response and vaccine are up to date. She continued revlimid 2.5 mg daily for 3 weeks on and then one week off. She is supposed to restart Revlimid on 08/09/14, but I will hold it and let Dr. Krista Blue know she is admitted in AM. Her hgb is stable 11.3 on 07/21/14-->11.3 today.   -will need to call Dr. Krista Blue in AM   CAD: has mild chest pain which is related to coughing. -Continue aspirin, metoprolol and Lipitor, and Plavix -get EKG  DVT ppx: SQ Heparin         Code Status: Full code Family Communication: None at bed side.     Disposition Plan: Admit to inpatient   Date of Service 08/09/2014    Ivor Costa [facility] Pager [phone]  If 7PM-7AM, please contact night-coverage [email or web address] Password [facility] 08/09/2014, 4:54 AM

## 2014-08-09 NOTE — Progress Notes (Signed)
Patient ID: Shannon Rush, female   DOB: 11-Jun-1941, 74 y.o.   MRN: [id number]  [facility] PROGRESS NOTE  Shannon Rush:[id number] DOB: 09-Aug-1940 DOA: 08/08/2014 PCP: [name], MD  Brief narrative:    74 y.o. female with multiple myeloma (on chemotherapy), coronary artery disease, history of stem cell transplantation in [place], who presented with fever, productive cough of green sputum, shortness of breath and mild chest pain. Work up in the ED demonstrated right lower lobe pneumonia per chest x-ray; WBC 1.7, negative urinalysis.   Assessment/Plan:    Principal Problem:   Sepsis secondary to HCAP (healthcare-associated pneumonia) - pt started on broad spectrum ABX Vancomycin and Maxipime due to immunosuppressed status (receiving chemo) - pt is clinically improving but still reports dyspnea with exertion and productive cough, Tmax 102.3 F over the past 24 hours  - continue current regimen with ABX noted above - follow up on sputum analysis, urine legionella and strep pneumo, respiratory virus panel  - also continue to provide supportive care with IVF, analgesia and antitussives as needed, oxygen as needed - continue bronchodilators scheduled and as needed  MM - s/p of stem cell transplantation (autotransplant) in August 2014 at [facility] - has been followed by [facility]. Last seen 02/2014 - followed by Dr. Burr Medico  here, last seen was 3 weeks ago, has an appointment on next Thursday - MM markers demonstrate a very good response and vaccines are up to date - pt is on revlimid 2.5 mg daily for 3 weeks on and then one week off, supposed to restart Revlimid on 08/09/14 - will notify Dr. Burr Medico of pt's admission and will follow up on her recommendations  Active Problems:   Pancytopenia due to chemotherapy - Hg and Plt stable on admission - WBC low, will continue to monitor and will repeat CBC with diff in AM   Coronary atherosclerosis of native coronary artery -  asymptomatic, continue aspirin and plavix   HLD - continue statin    HTN - reasonable inpatient control  - continue Metoprolol    DVT prophylaxis - Heparin SQ   Code Status: Full.  Family Communication:  plan of care discussed with the patient Disposition Plan: Home when stable. Pt reports she was scheduled to travel to [place] next week.   IV access:  Peripheral IV Procedures and diagnostic studies:    Dg Chest 2 View  08/08/2014    Dense RIGHT lower lobe consolidation, c/w pneumonia. Medical Consultants:  None Other Consultants:  None IAnti-Infectives:   Vancomycin 1/26 --> Maxipime 1/26 -->  Faye Ramsay, MD  [facility] Pager [phone]  If 7PM-7AM, please contact night-coverage [email or web address] Password [facility] 08/09/2014, 2:54 PM   LOS: 1 day   HPI/Subjective: No events overnight. Reports persistent dyspnea and productive cough.  Objective: Filed Vitals:   08/09/14 0458 08/09/14 0625 08/09/14 0628 08/09/14 1419  BP: 89/43 77/50 90/48  121/56  Pulse: 92 88 91 99  Temp: 100.1 F (37.8 C) 99.8 F (37.7 C)  98.9 F (37.2 C)  TempSrc: Oral Oral  Oral  Resp:    18  Height:      Weight:      SpO2: 97%   97%    Intake/Output Summary (Last 24 hours) at 08/09/14 1454 Last data filed at 08/09/14 1426  Gross per 24 hour  Intake 1020.42 ml  Output   1325 ml  Net -304.58 ml    Exam:   General:  Pt is alert, follows commands appropriately, not in acute distress  Cardiovascular: Regular rate and rhythm, S1/S2, no rubs, no gallops  Respiratory: Course breath sounds bilaterally with bibasilar rhonchi worse on the right side   Abdomen: Soft, non tender, non distended, bowel sounds present, no guarding  Extremities: pulses DP and PT palpable bilaterally  Neuro: Grossly nonfocal  Data Reviewed: Basic Metabolic Panel:  Recent Labs Lab 08/08/14 2025  NA 140  K 3.8  CL 106  CO2 23  GLUCOSE 119*  BUN 19  CREATININE 0.78  CALCIUM 9.7   Liver Function  Tests:  Recent Labs Lab 08/08/14 2025  AST 36  ALT 27  ALKPHOS 72  BILITOT 0.9  PROT 7.4  ALBUMIN 4.3   CBC:  Recent Labs Lab 08/08/14 2025  WBC 1.7*  NEUTROABS 1.0*  HGB 11.3*  HCT 33.6*  MCV 104.3*  PLT 162    Recent Results (from the past 240 hour(s))  Culture, sputum-assessment     Status: None   Collection Time: 08/09/14 12:22 PM  Result Value Ref Range Status   Specimen Description SPUTUM  Final   Special Requests NONE  Final   Sputum evaluation   Final    THIS SPECIMEN IS ACCEPTABLE. RESPIRATORY CULTURE REPORT TO FOLLOW.   Report Status 08/09/2014 FINAL  Final     Scheduled Meds: . sodium chloride   Intravenous STAT  . aspirin EC  81 mg Oral QODAY  . atorvastatin  40 mg Oral q1800  . calcium carbonate  1,250 mg Oral Daily  . ceFEPime (MAXIPIME) IV  2 g Intravenous 3 times per day  . cholecalciferol  400 Units Oral Daily  . clopidogrel  75 mg Oral Daily  . Difluprednate  1 drop Left Eye Daily  . feeding supplement (ENSURE COMPLETE)  237 mL Oral BID BM  . heparin  5,000 Units Subcutaneous 3 times per day  . ketorolac  1 drop Left Eye QID  . metoprolol succinate  25 mg Oral Daily  . vancomycin  500 mg Intravenous Q12H   Continuous Infusions: . sodium chloride 125 mL/hr at 08/09/14 1419

## 2014-08-09 NOTE — Progress Notes (Signed)
OT Cancellation Note  Patient Details Name: Shannon Rush MRN: [id number] DOB: 1941-06-04   Cancelled Treatment:     Orders received and chart reviewed. Pt was screened today for acute OT services and had recently completed assessment for PT where she was supervision-Mod I for toilet transfers (supervision secondary to IV/lines. Pt/spouse report no needs related to OT & P.T. Confirms this w/ OT. Will sign off at this time secondary to no needs.  Carlynn Herald, Amy Beth Dixon, OTR/L 08/09/2014, 11:01 AM

## 2014-08-09 NOTE — Progress Notes (Signed)
Nutrition Brief Note  Patient identified on the Malnutrition Screening Tool (MST) Report  Pt's weight is stable per weight history. Pt states she stays around 120 lb. Pt eating adequately. Assisted pt in ordering lunch today. Pt drinking an Ensure during visit.  Wt Readings from Last 15 Encounters:  08/09/14 128 lb 12 oz (58.4 kg)  07/21/14 126 lb 12.8 oz (57.516 kg)  06/23/14 126 lb 8 oz (57.38 kg)  05/12/14 127 lb 12.8 oz (57.97 kg)  04/14/14 125 lb 12.8 oz (57.063 kg)  04/11/14 127 lb 14.4 oz (58.015 kg)  03/11/14 122 lb 14.4 oz (55.747 kg)  03/11/14 122 lb (55.339 kg)  03/10/14 121 lb (54.885 kg)  01/25/14 122 lb 3.2 oz (55.43 kg)  12/28/13 119 lb 6.4 oz (54.159 kg)  12/20/13 122 lb 8 oz (55.566 kg)  12/02/13 126 lb (57.153 kg)  11/26/13 124 lb (56.246 kg)  11/20/13 126 lb (57.153 kg)    Body mass index is 20.16 kg/(m^2). Patient meets criteria for normal range based on current BMI.   Current diet order is Heart Healthy, patient is consuming approximately 75% of meals at this time. Labs and medications reviewed.   No nutrition interventions warranted at this time. If nutrition issues arise, please consult RD.   Clayton Bibles, MS, RD, LDN Pager: [phone] After Hours Pager: [phone]

## 2014-08-09 NOTE — Progress Notes (Signed)
ANTIBIOTIC CONSULT NOTE - INITIAL  Pharmacy Consult for vancomycin Indication: pneumonia  Allergies  Allergen Reactions  . Zithromax [Azithromycin]     Full body rash  . Ciprofloxacin Rash    Throwing up and stomach pain     Patient Measurements: Height: 5' 7"  (170.2 cm) Weight: 128 lb 12 oz (58.4 kg) IBW/kg (Calculated) : 61.6 Adjusted Body Weight:   Vital Signs: Temp: 100.1 F (37.8 C) (01/26 0458) Temp Source: Oral (01/26 0458) BP: 89/43 mmHg (01/26 0458) Pulse Rate: 92 (01/26 0458) Intake/Output from previous day: 01/25 0701 - 01/26 0700 In: -  Out: 550 [Urine:550] Intake/Output from this shift: Total I/O In: -  Out: 550 [Urine:550]  Labs:  Recent Labs  08/08/14 2025  WBC 1.7*  HGB 11.3*  PLT 162  CREATININE 0.78   Estimated Creatinine Clearance: 58.6 mL/min (by C-G formula based on Cr of 0.78). No results for input(s): VANCOTROUGH, VANCOPEAK, VANCORANDOM, GENTTROUGH, GENTPEAK, GENTRANDOM, TOBRATROUGH, TOBRAPEAK, TOBRARND, AMIKACINPEAK, AMIKACINTROU, AMIKACIN in the last 72 hours.   Microbiology: No results found for this or any previous visit (from the past 720 hour(s)).  Medical History: Past Medical History  Diagnosis Date  . Multiple myeloma 07/01/2012  . Unspecified deficiency anemia   . OSTEOPOROSIS 06/11/2010    Multiple compression fractures; and spontaneous fracture of sternum Qualifier: Diagnosis of  By: [name], [name]     . 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 Cord compression 07/13/12    MRI- diffuse myeloma involvement of T-L spine  . History of radiation therapy 07/13/12-07/27/12    spinal cord compression T3-T10,left scapula  . CAD (coronary artery disease)     a. inf STEMI (in [place] >>> [facility]) >>> LHC (8/15- [place]):  pRCA 60%, mid to dist RCA 50% >>> med rx  . Hx of echocardiogram     Echo (03/02/14-done at [facility]):  Normal LVEF without significant regional wall  motion abnormalities. Mild     Medications:  Anti-infectives    Start     Dose/Rate Route Frequency Ordered Stop   08/09/14 1000  vancomycin (VANCOCIN) 500 mg in sodium chloride 0.9 % 100 mL IVPB     500 mg100 mL/hr over 60 Minutes Intravenous Every 12 hours 08/09/14 0556     08/09/14 0600  ceFEPIme (MAXIPIME) 2 g in dextrose 5 % 50 mL IVPB     2 g100 mL/hr over 30 Minutes Intravenous 3 times per day 08/09/14 0106 08/17/14 0559   08/09/14 0115  mefloquine (LARIAM) tablet 250 mg  Status:  Discontinued     250 mg Oral Every 7 days 08/09/14 0106 08/09/14 0225   08/08/14 2230  ceFEPIme (MAXIPIME) 1 g in dextrose 5 % 50 mL IVPB     1 g100 mL/hr over 30 Minutes Intravenous  Once 08/08/14 2151 08/09/14 0011   08/08/14 2200  vancomycin (VANCOCIN) IVPB 1000 mg/200 mL premix     1,000 mg200 mL/hr over 60 Minutes Intravenous  Once 08/08/14 2151 08/08/14 2311     Assessment: Patient with hx of multiple myeloma and now PNA.  First dose of antibiotics already given.  Goal of Therapy:  Vancomycin trough level 15-20 mcg/ml  Plan:  Measure antibiotic drug levels at steady state Follow up culture results Vancomycin 553m iv q12hr  GTyler Rush Shannon Rush 08/09/2014,5:56 AM

## 2014-08-09 NOTE — Progress Notes (Addendum)
Requested flu swab from Lab.  Lab staff informed me that they are currently of out flu swabs and won't have any until the am.  Will continue to monitor pt and assess again in the am to see if swab is available.  Owens Shark, Kimila Papaleo Cherie

## 2014-08-09 NOTE — Evaluation (Signed)
Physical Therapy Evaluation Patient Details Name: KHARIZMA LESNICK MRN: [id number] DOB: Jan 11, 1941 Today's Date: 08/09/2014   History of Present Illness  74 y.o. female with past medical history of multiple myeloma (on chemotherapy), coronary artery disease, history of stem cell transplantation in [facility], who presents with fever, productive cough, shortness of breath and mild chest pain, admitted for HCAP.  Clinical Impression  Patient evaluated by Physical Therapy with no further acute PT needs identified. All education has been completed and the patient has no further questions. Pt able to ambulate around unit with IV pole.  Pt SpO2 90% at lowest on room air during ambulation. No follow-up Physical Therapy or equipment needs identified at this time and pt in agreement. PT is signing off. Thank you for this referral.      Follow Up Recommendations No PT follow up    Equipment Recommendations  None recommended by PT    Recommendations for Other Services       Precautions / Restrictions Precautions Precautions: None      Mobility  Bed Mobility Overal bed mobility: Modified Independent                Transfers Overall transfer level: Needs assistance   Transfers: Sit to/from Stand Sit to Stand: Supervision         General transfer comment: supervision for lines   Ambulation/Gait Ambulation/Gait assistance: Supervision Ambulation Distance (Feet): 400 Feet Assistive device: None Gait Pattern/deviations: WFL(Within Functional Limits)     General Gait Details: pt pushed IV pole, good pace, no LOB or unsteadiness observed, SpO2 remained above 90% on room air and HR 112 or under  Stairs            Wheelchair Mobility    Modified Rankin (Stroke Patients Only)       Balance Overall balance assessment: No apparent balance deficits (not formally assessed)                                           Pertinent Vitals/Pain Pain Assessment:  No/denies pain    Home Living Family/patient expects to be discharged to:: Private residence Living Arrangements: Spouse/significant other   Type of Home: House       Home Layout: Two level Home Equipment: None      Prior Function Level of Independence: Independent               Hand Dominance        Extremity/Trunk Assessment               Lower Extremity Assessment: Overall WFL for tasks assessed      Cervical / Trunk Assessment: Normal  Communication   Communication: No difficulties  Cognition Arousal/Alertness: Awake/alert Behavior During Therapy: WFL for tasks assessed/performed Overall Cognitive Status: Within Functional Limits for tasks assessed                      General Comments      Exercises        Assessment/Plan    PT Assessment Patent does not need any further PT services  PT Diagnosis     PT Problem List    PT Treatment Interventions     PT Goals (Current goals can be found in the Care Plan section) Acute Rehab PT Goals PT Goal Formulation: All assessment and education complete, DC therapy  Frequency     Barriers to discharge        Co-evaluation               End of Session   Activity Tolerance: Patient tolerated treatment well Patient left: in bed;with call bell/phone within reach Nurse Communication: Mobility status         Time: 8309-4076 PT Time Calculation (min) (ACUTE ONLY): 13 min   Charges:   PT Evaluation $Initial PT Evaluation Tier I: 1 Procedure     PT G Codes:        Akira Perusse,KATHrine E 08/09/2014, 12:09 PM Carmelia Bake, PT, DPT 08/09/2014 Pager: [phone]

## 2014-08-09 NOTE — Progress Notes (Signed)
Cont with plan of care. No changes in initial am assessment at this time

## 2014-08-10 DIAGNOSIS — E876 Hypokalemia: Secondary | ICD-10-CM

## 2014-08-10 DIAGNOSIS — M810 Age-related osteoporosis without current pathological fracture: Secondary | ICD-10-CM

## 2014-08-10 DIAGNOSIS — D61818 Other pancytopenia: Secondary | ICD-10-CM | POA: Diagnosis present

## 2014-08-10 DIAGNOSIS — R509 Fever, unspecified: Secondary | ICD-10-CM

## 2014-08-10 DIAGNOSIS — D61810 Antineoplastic chemotherapy induced pancytopenia: Secondary | ICD-10-CM

## 2014-08-10 DIAGNOSIS — T451X5A Adverse effect of antineoplastic and immunosuppressive drugs, initial encounter: Secondary | ICD-10-CM

## 2014-08-10 LAB — URINE CULTURE
COLONY COUNT: NO GROWTH
Culture: NO GROWTH

## 2014-08-10 LAB — CBC WITH DIFFERENTIAL/PLATELET
BASOS PCT: 0 % (ref 0–1)
Basophils Absolute: 0 10*3/uL (ref 0.0–0.1)
EOS PCT: 4 % (ref 0–5)
Eosinophils Absolute: 0.1 10*3/uL (ref 0.0–0.7)
HCT: 27.9 % — ABNORMAL LOW (ref 36.0–46.0)
HEMOGLOBIN: 9.4 g/dL — AB (ref 12.0–15.0)
LYMPHS ABS: 0.2 10*3/uL — AB (ref 0.7–4.0)
LYMPHS PCT: 7 % — AB (ref 12–46)
MCH: 35.1 pg — ABNORMAL HIGH (ref 26.0–34.0)
MCHC: 33.7 g/dL (ref 30.0–36.0)
MCV: 104.1 fL — ABNORMAL HIGH (ref 78.0–100.0)
MONOS PCT: 5 % (ref 3–12)
Monocytes Absolute: 0.1 10*3/uL (ref 0.1–1.0)
NEUTROS ABS: 2.4 10*3/uL (ref 1.7–7.7)
NEUTROS PCT: 84 % — AB (ref 43–77)
Platelets: 132 10*3/uL — ABNORMAL LOW (ref 150–400)
RBC: 2.68 MIL/uL — AB (ref 3.87–5.11)
RDW: 16.3 % — ABNORMAL HIGH (ref 11.5–15.5)
WBC: 2.8 10*3/uL — AB (ref 4.0–10.5)

## 2014-08-10 LAB — BASIC METABOLIC PANEL
Anion gap: 5 (ref 5–15)
BUN: 16 mg/dL (ref 6–23)
CALCIUM: 7.8 mg/dL — AB (ref 8.4–10.5)
CHLORIDE: 108 mmol/L (ref 96–112)
CO2: 21 mmol/L (ref 19–32)
CREATININE: 0.8 mg/dL (ref 0.50–1.10)
GFR, EST AFRICAN AMERICAN: 83 mL/min — AB (ref >90)
GFR, EST NON AFRICAN AMERICAN: 72 mL/min — AB (ref >90)
Glucose, Bld: 105 mg/dL — ABNORMAL HIGH (ref 70–99)
POTASSIUM: 3.3 mmol/L — AB (ref 3.5–5.1)
SODIUM: 134 mmol/L — AB (ref 135–145)

## 2014-08-10 LAB — LEGIONELLA ANTIGEN, URINE

## 2014-08-10 MED ORDER — SODIUM CHLORIDE 0.9 % IV SOLN
INTRAVENOUS | Status: DC
Start: 2014-08-10 — End: 2014-08-11
  Administered 2014-08-10 – 2014-08-11 (×2): via INTRAVENOUS

## 2014-08-10 MED ORDER — POTASSIUM CHLORIDE CRYS ER 20 MEQ PO TBCR
40.0000 meq | EXTENDED_RELEASE_TABLET | Freq: Once | ORAL | Status: AC
  Administered 2014-08-10: 40 meq via ORAL
  Filled 2014-08-10: qty 2

## 2014-08-10 MED ORDER — IBUPROFEN 200 MG PO TABS
400.0000 mg | ORAL_TABLET | Freq: Four times a day (QID) | ORAL | Status: AC | PRN
  Administered 2014-08-10 – 2014-08-12 (×2): 400 mg via ORAL
  Filled 2014-08-10 (×4): qty 2

## 2014-08-10 MED ORDER — PIPERACILLIN-TAZOBACTAM 3.375 G IVPB
3.3750 g | Freq: Three times a day (TID) | INTRAVENOUS | Status: DC
  Administered 2014-08-10: 3.375 g via INTRAVENOUS
  Filled 2014-08-10 (×2): qty 50

## 2014-08-10 NOTE — Progress Notes (Addendum)
ANTIBIOTIC CONSULT NOTE - INITIAL  Pharmacy Consult for vancomycin/cefepime Indication: pneumonia  Allergies  Allergen Reactions  . Zithromax [Azithromycin]     Full body rash  . Ciprofloxacin Rash    Throwing up and stomach pain     Patient Measurements: Height: _0  (170.2 cm) Weight: 128 lb 12 oz (58.4 kg) IBW/kg (Calculated) : 61.6 Adjusted Body Weight:   Vital Signs: Temp: 102.7 F (39.3 C) (01/27 1711) Temp Source: Oral (01/27 1711) BP: 101/64 mmHg (01/27 1546) Pulse Rate: 106 (01/27 1546) Intake/Output from previous day: 01/26 0701 - 01/27 0700 In: 2575 [P.O.:600; I.V.:1675; IV Piggyback:300] Out: 4097 [Urine:1275] Intake/Output from this shift:    Labs:  Recent Labs  08/08/14 2025 08/10/14 0455  WBC 1.7* 2.8*  HGB 11.3* 9.4*  PLT 162 132*  CREATININE 0.78 0.80   Estimated Creatinine Clearance: 58.6 mL/min (by C-G formula based on Cr of 0.8). No results for input(s): VANCOTROUGH, VANCOPEAK, VANCORANDOM, GENTTROUGH, GENTPEAK, GENTRANDOM, TOBRATROUGH, TOBRAPEAK, TOBRARND, AMIKACINPEAK, AMIKACINTROU, AMIKACIN in the last 72 hours.   Microbiology: Recent Results (from the past 720 hour(s))  Urine culture     Status: None   Collection Time: 08/08/14  8:18 PM  Result Value Ref Range Status   Specimen Description URINE, CLEAN CATCH  Final   Special Requests NONE  Final   Colony Count NO GROWTH Performed at [facility]   Final   Culture NO GROWTH Performed at [facility]   Final   Report Status 08/10/2014 FINAL  Final  Blood culture (routine x 2)     Status: None (Preliminary result)   Collection Time: 08/08/14  9:53 PM  Result Value Ref Range Status   Specimen Description BLOOD LEFT FOREARM  Final   Special Requests BOTTLES DRAWN AEROBIC ONLY 3CC  Final   Culture   Final           BLOOD CULTURE RECEIVED NO GROWTH TO DATE CULTURE WILL BE HELD FOR 5 DAYS BEFORE ISSUING A FINAL NEGATIVE REPORT Performed at [facility]    Report Status PENDING  Incomplete  Blood culture (routine x 2)     Status: None (Preliminary result)   Collection Time: 08/08/14  9:54 PM  Result Value Ref Range Status   Specimen Description BLOOD LEFT ANTECUBITAL  Final   Special Requests BOTTLES DRAWN AEROBIC AND ANAEROBIC 5CC  Final   Culture   Final           BLOOD CULTURE RECEIVED NO GROWTH TO DATE CULTURE WILL BE HELD FOR 5 DAYS BEFORE ISSUING A FINAL NEGATIVE REPORT Performed at [facility]    Report Status PENDING  Incomplete  Culture, sputum-assessment     Status: None   Collection Time: 08/09/14 12:22 PM  Result Value Ref Range Status   Specimen Description SPUTUM  Final   Special Requests NONE  Final   Sputum evaluation   Final    THIS SPECIMEN IS ACCEPTABLE. RESPIRATORY CULTURE REPORT TO FOLLOW.   Report Status 08/09/2014 FINAL  Final  Culture, respiratory (NON-Expectorated)     Status: None (Preliminary result)   Collection Time: 08/09/14 12:22 PM  Result Value Ref Range Status   Specimen Description SPUTUM  Final   Special Requests NONE  Final   Gram Stain   Final    ABUNDANT WBC PRESENT, PREDOMINANTLY PMN ABUNDANT SQUAMOUS EPITHELIAL CELLS PRESENT MODERATE GRAM POSITIVE RODS FEW GRAM POSITIVE COCCI IN PAIRS FEW GRAM NEGATIVE RODS    Culture   Final  Culture reincubated for better growth Performed at [facility] PENDING  Incomplete     Medications:  Anti-infectives    Start     Dose/Rate Route Frequency Ordered Stop   08/09/14 1000  vancomycin (VANCOCIN) 500 mg in sodium chloride 0.9 % 100 mL IVPB     500 mg100 mL/hr over 60 Minutes Intravenous Every 12 hours 08/09/14 0556     08/09/14 0600  ceFEPIme (MAXIPIME) 2 g in dextrose 5 % 50 mL IVPB  Status:  Discontinued     2 g100 mL/hr over 30 Minutes Intravenous 3 times per day 08/09/14 0106 08/10/14 1930   08/09/14 0115  mefloquine (LARIAM) tablet 250 mg  Status:  Discontinued     250 mg Oral Every 7 days 08/09/14 0106  08/09/14 0225   08/08/14 2230  ceFEPIme (MAXIPIME) 1 g in dextrose 5 % 50 mL IVPB     1 g100 mL/hr over 30 Minutes Intravenous  Once 08/08/14 2151 08/09/14 0011   08/08/14 2200  vancomycin (VANCOCIN) IVPB 1000 mg/200 mL premix     1,000 mg200 mL/hr over 60 Minutes Intravenous  Once 08/08/14 2151 08/08/14 2311     Assessment: 73 yoF admitted on 1/26 with fever, cough, SOB and mild chest pain. PMH includes multiple myeloma, CAD, hx of stem cell transplant ([facility]). CXR shows RLL pneumonia. Pharmacy is consulted to dose vancomycin and renally adjust antibiotics. ID has changed cefepime to zosyn for better anaerobic coverage with recent dental infection.   1/26 >> vanco >> 1/26 >> cefepime>> 1/27 1/27 >> zosyn >>  Tmax: 102.3 WBCs: increased to 2.8 (ANC 2.4) Renal: SCr 0.8, CrCl ~ 59 ml/min Lactic acid: 1.3  1/25 blood x2: ngtd 1/25 urine: NGF 1/26 Strep pneumo Ur AG: positive 1/26 Legionella Ur AG: negative 1/26 Respiratory virus panel:  1/26 sputum cxt: reincubated  Goal of Therapy:  Vancomycin trough level 15-20 mcg/ml  Plan:   Continue vancomycin 564m IV q12h as previously ordered  Zosyn 3.375gm IV q8h over 4h infusion  Monitor renal function and check trough as indicated  DDoreene Eland PharmD, BCPS.   Pager: [phone] 08/10/2014,7:38 PM

## 2014-08-10 NOTE — Progress Notes (Signed)
Patient ID: Shannon Rush, female   DOB: July 23, 1940, 74 y.o.   MRN: [id number]  [facility] PROGRESS NOTE  Shannon Rush UVO:[id number] DOB: 04-02-1941 DOA: 08/08/2014 PCP: [name], MD  Primary Oncologist: Dr. [name].   Brief narrative:    74 y.o. female with multiple myeloma (on chemotherapy), coronary artery disease, history of stem cell transplantation in [facility] Aug 2014, who presented with fever, productive cough of green sputum, shortness of breath and mild chest pain. Work up in the ED demonstrated right lower lobe pneumonia per chest x-ray; WBC 1.7, negative urinalysis.   Assessment/Plan:    Principal Problem:   Sepsis secondary to RLL HCAP (healthcare-associated pneumonia)- Pneumococcal - pt started on broad spectrum ABX Vancomycin and Maxipime due to immunosuppressed status (receiving chemo) - Sputum Gram stain: Moderate gram-positive rods, few gram-positive cocci in pairs and few gram-negative rods. Sputum culture pending. Urine culture: No growth. Blood cultures 2: Negative to date. RSV panel: Pending. Urine Legionella antigen: Negative. Urine streptococcal antigen positive. - Clinically better but continued to spike high fever (102.64F on 1/26 at 9:48 PM) despite 48 hours of IV antibiotics. Options may be to continue current regimen without change unless she continues to spike high fever versus broadening antimicrobial spectrum. Requested ID consultation given her underlying immunocompromised state.  - Hold Revlimid. We will request HIV antibodies.   Multiple myeloma  - s/p stem cell transplantation (autotransplant) in August 2014 at [facility] - has been followed by [facility]. Last seen 02/2014 - followed by Dr. Burr Medico  here, last seen was 3 weeks ago, has an appointment on next Thursday. Dr. Burr Medico visited with patient in the hospital on 1/27. Discussed with Dr. Pilar Plate who advised holding Revlimid and suggested ID consultation.  - MM markers demonstrate a very  good response and vaccines are up to date - pt is on revlimid 2.5 mg daily for 3 weeks on and then one week off, supposed to restart Revlimid on 08/09/14-Held   Active Problems:   Pancytopenia due to chemotherapy - Hg and Plt stable on admission - WBC improved. Platelets and hemoglobin have dropped slightly compared to admission. No reported bleeding. Not neutropenic. Follow CBCs.     Coronary atherosclerosis of native coronary artery - asymptomatic, continue aspirin and plavix    HLD - continue statin     HTN - reasonable inpatient control  - continue Metoprolol   Hypokalemia - Replace and follow    DVT prophylaxis - Heparin SQ   Code Status: Full.  Family Communication:  None at bedside Disposition Plan: Home when stable. Pt reports she was scheduled to travel to [place] next week- suggested she postpone trip due to current hospitalization.   IV access:  Peripheral IV Procedures and diagnostic studies:    Dg Chest 2 View  08/08/2014    Dense RIGHT lower lobe consolidation, c/w pneumonia. Medical Consultants:  None Other Consultants:  None IAnti-Infectives:   Vancomycin 1/26 --> Maxipime 1/26 -->  Time spent: 19 minutes  Trayvond Viets, MD, FACP, FHM. [facility] Pager [phone]  If 7PM-7AM, please contact night-coverage [email or web address] Password [email or web address] 08/10/2014, 1:55 PM   LOS: 2 days   HPI/Subjective: Feels slightly better. Fevers/temperature 102.64F overnight. Feels "washed out". Minimal dry cough. No dyspnea, dysuria, urinary frequency, diarrhea.   Objective: Filed Vitals:   08/09/14 2148 08/10/14 0102 08/10/14 0437 08/10/14 0805  BP: 117/48  107/49   Pulse: 104  92   Temp: 102.3 F (39.1 C) 99.2 F (37.3 C) 99.1 F (37.3 C)  TempSrc: Oral Oral Oral   Resp: 16  16   Height:      Weight:      SpO2: 92%  95% 96%    Intake/Output Summary (Last 24 hours) at 08/10/14 1344 Last data filed at 08/10/14 3532  Gross per 24 hour  Intake   2165 ml   Output    750 ml  Net   1415 ml    Exam:   General:  Pt is alert, follows commands appropriately, not in acute distress  Cardiovascular: Regular rate and rhythm, S1/S2, no rubs, no gallops  Respiratory: Diminished breath sounds in the bases with occasional basal crackles. Otherwise clear to auscultation. No increased work of breathing.   Extremities: pulses DP and PT palpable bilaterally. Symmetric 5 x 5 power.  Neuro: Grossly nonfocal. Alert and oriented.  Data Reviewed: Basic Metabolic Panel:  Recent Labs Lab 08/08/14 2025 08/10/14 0455  NA 140 134*  K 3.8 3.3*  CL 106 108  CO2 23 21  GLUCOSE 119* 105*  BUN 19 16  CREATININE 0.78 0.80  CALCIUM 9.7 7.8*   Liver Function Tests:  Recent Labs Lab 08/08/14 2025  AST 36  ALT 27  ALKPHOS 72  BILITOT 0.9  PROT 7.4  ALBUMIN 4.3   CBC:  Recent Labs Lab 08/08/14 2025 08/10/14 0455  WBC 1.7* 2.8*  NEUTROABS 1.0* 2.4  HGB 11.3* 9.4*  HCT 33.6* 27.9*  MCV 104.3* 104.1*  PLT 162 132*    Recent Results (from the past 240 hour(s))  Urine culture     Status: None   Collection Time: 08/08/14  8:18 PM  Result Value Ref Range Status   Specimen Description URINE, CLEAN CATCH  Final   Special Requests NONE  Final   Colony Count NO GROWTH Performed at [facility]   Final   Culture NO GROWTH Performed at [facility]   Final   Report Status 08/10/2014 FINAL  Final  Blood culture (routine x 2)     Status: None (Preliminary result)   Collection Time: 08/08/14  9:53 PM  Result Value Ref Range Status   Specimen Description BLOOD LEFT FOREARM  Final   Special Requests BOTTLES DRAWN AEROBIC ONLY 3CC  Final   Culture   Final           BLOOD CULTURE RECEIVED NO GROWTH TO DATE CULTURE WILL BE HELD FOR 5 DAYS BEFORE ISSUING A FINAL NEGATIVE REPORT Performed at [facility]    Report Status PENDING  Incomplete  Blood culture (routine x 2)     Status: None (Preliminary result)   Collection  Time: 08/08/14  9:54 PM  Result Value Ref Range Status   Specimen Description BLOOD LEFT ANTECUBITAL  Final   Special Requests BOTTLES DRAWN AEROBIC AND ANAEROBIC 5CC  Final   Culture   Final           BLOOD CULTURE RECEIVED NO GROWTH TO DATE CULTURE WILL BE HELD FOR 5 DAYS BEFORE ISSUING A FINAL NEGATIVE REPORT Performed at [facility]    Report Status PENDING  Incomplete  Culture, sputum-assessment     Status: None   Collection Time: 08/09/14 12:22 PM  Result Value Ref Range Status   Specimen Description SPUTUM  Final   Special Requests NONE  Final   Sputum evaluation   Final    THIS SPECIMEN IS ACCEPTABLE. RESPIRATORY CULTURE REPORT TO FOLLOW.   Report Status 08/09/2014 FINAL  Final  Culture, respiratory (NON-Expectorated)  Status: None (Preliminary result)   Collection Time: 08/09/14 12:22 PM  Result Value Ref Range Status   Specimen Description SPUTUM  Final   Special Requests NONE  Final   Gram Stain   Final    ABUNDANT WBC PRESENT, PREDOMINANTLY PMN ABUNDANT SQUAMOUS EPITHELIAL CELLS PRESENT MODERATE GRAM POSITIVE RODS FEW GRAM POSITIVE COCCI IN PAIRS FEW GRAM NEGATIVE RODS    Culture   Final    Culture reincubated for better growth Performed at [facility]    Report Status PENDING  Incomplete     Scheduled Meds: . albuterol  2.5 mg Nebulization TID  . aspirin EC  81 mg Oral QODAY  . atorvastatin  40 mg Oral q1800  . calcium carbonate  1,250 mg Oral Daily  . ceFEPime (MAXIPIME) IV  2 g Intravenous 3 times per day  . cholecalciferol  400 Units Oral Daily  . clopidogrel  75 mg Oral Daily  . Difluprednate  1 drop Left Eye Daily  . feeding supplement (ENSURE COMPLETE)  237 mL Oral BID BM  . heparin  5,000 Units Subcutaneous 3 times per day  . ketorolac  1 drop Left Eye QID  . metoprolol succinate  25 mg Oral Daily  . vancomycin  500 mg Intravenous Q12H   Continuous Infusions: . sodium chloride 50 mL/hr at 08/09/14 1500

## 2014-08-10 NOTE — Consult Note (Signed)
[name]    Date of Admission:  08/08/2014           Day 3 vancomycin        Day 3 cefepime       Reason for Consult: Healthcare Associated Pneumonia and persistent fever    Referring Physician: Dr. Vernell Leep  Principal Problem:   HCAP (healthcare-associated pneumonia) Active Problems:   Multiple myeloma   History of peripheral stem cell transplant   Anemia, macrocytic   Osteoporosis   History of ST elevation myocardial infarction (STEMI)   Coronary atherosclerosis of native coronary artery   Pancytopenia   . albuterol  2.5 mg Nebulization TID  . aspirin EC  81 mg Oral QODAY  . atorvastatin  40 mg Oral q1800  . calcium carbonate  1,250 mg Oral Daily  . ceFEPime (MAXIPIME) IV  2 g Intravenous 3 times per day  . cholecalciferol  400 Units Oral Daily  . clopidogrel  75 mg Oral Daily  . Difluprednate  1 drop Left Eye Daily  . feeding supplement (ENSURE COMPLETE)  237 mL Oral BID BM  . heparin  5,000 Units Subcutaneous 3 times per day  . ketorolac  1 drop Left Eye QID  . metoprolol succinate  25 mg Oral Daily  . vancomycin  500 mg Intravenous Q12H    Recommendations: 1. Continue Vancomycin 2. Change cefepime to piperacillin tazobactam   Assessment: I suspect that she has acute, bacterial healthcare associated pneumonia. Her recent dental infection followed by pneumonia with mixed bacterial flora on Gram stain suggests possible early aerobic anaerobic pneumonia. I will continue vancomycin and broaden cefepime to piperacillin tazobactam pending final cultures.    HPI: Shannon Rush is a 74 y.o. female with multiple myeloma diagnosed in 2014. She initially underwent chemotherapy and radiation therapy followed by autologous bone marrow transplant at [facility] on 02/12/2013. Since that time she has been on Revlimid therapy, and cycles of 3 weeks on and one-week off. She has had some problems with cytopenias that required a  dose reduction and Revlimid but otherwise has been doing well. She recently developed some left lower molar pain.  She was seen by her dentist and was told that her x-rays showed some abnormalities suggesting infection. She was treated with oral penicillin for 10 days completing therapy about 10 days ago. She has had a dry cough for several weeks but she states that this is not unusual. 3 days ago she had sudden onset of chills and fever. She also developed worsening cough productive of green sputum leading to admission. She was febrile. Chest x-ray revealed a small area of dense consolidation in the right lower lobe. She was started on empiric vancomycin and cefepime. Sputum Gram stain reveals mixed bacterial flora with gram-positive rods, gram-positive cocci in pairs, and gram-negative rods. Her sputum culture has been re-intubated for better growth. Her blood culture is negative to date. She remains febrile and cannot tell much difference in how she is feeling.   Review of Systems: Constitutional: positive for chills, fevers and malaise, negative for sweats and weight loss Eyes: negative Ears, nose, mouth, throat, and face: negative Respiratory: positive for cough and sputum, negative for dyspnea on exertion and pleurisy/chest pain Cardiovascular: negative Gastrointestinal: negative Genitourinary:negative  Past Medical History  Diagnosis Date  . Multiple myeloma 07/01/2012  . Unspecified deficiency anemia   . OSTEOPOROSIS 06/11/2010    Multiple compression fractures; and spontaneous  fracture of sternum Qualifier: Diagnosis of  By: [name], [name]     . 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 Cord compression 07/13/12    MRI- diffuse myeloma involvement of T-L spine  . History of radiation therapy 07/13/12-07/27/12    spinal cord compression T3-T10,left scapula  . CAD (coronary artery disease)     a. inf STEMI (in [place] >>> [facility]) >>> LHC (8/15- [place]):  pRCA  60%, mid to dist RCA 50% >>> med rx  . Hx of echocardiogram     Echo (03/02/14-done at [facility]):  Normal LVEF without significant regional wall motion abnormalities. Mild     History  Substance Use Topics  . Smoking status: Never Smoker   . Smokeless tobacco: Never Used  . Alcohol Use: 3.5 oz/week    7 drink(s) per week    Family History  Problem Relation Age of Onset  . Glaucoma Mother   . Heart disease Mother   . Heart disease Father   . Cancer Brother     stage IV prostate CA   Allergies  Allergen Reactions  . Zithromax [Azithromycin]     Full body rash  . Ciprofloxacin Rash    Throwing up and stomach pain     OBJECTIVE: Blood pressure 101/64, pulse 106, temperature 102.7 F (39.3 C), temperature source Oral, resp. rate 17, height 5' 7"  (1.702 m), weight 128 lb 12 oz (58.4 kg), SpO2 94 %. General: She is alert and in good spirits. She is in no distress Skin: Some mild redness on her cheeks and puffiness in her face but no generalized rash Lungs: Bibasilar crackles right greater than left Cor: Regular S1 and S2 with no murmur Abdomen: Soft and nontender  Lab Results Lab Results  Component Value Date   WBC 2.8* 08/10/2014   HGB 9.4* 08/10/2014   HCT 27.9* 08/10/2014   MCV 104.1* 08/10/2014   PLT 132* 08/10/2014    Lab Results  Component Value Date   CREATININE 0.80 08/10/2014   BUN 16 08/10/2014   NA 134* 08/10/2014   K 3.3* 08/10/2014   CL 108 08/10/2014   CO2 21 08/10/2014    Lab Results  Component Value Date   ALT 27 08/08/2014   AST 36 08/08/2014   ALKPHOS 72 08/08/2014   BILITOT 0.9 08/08/2014     Microbiology: Recent Results (from the past 240 hour(s))  Urine culture     Status: None   Collection Time: 08/08/14  8:18 PM  Result Value Ref Range Status   Specimen Description URINE, CLEAN CATCH  Final   Special Requests NONE  Final   Colony Count NO GROWTH Performed at [facility]   Final   Culture NO  GROWTH Performed at [facility]   Final   Report Status 08/10/2014 FINAL  Final  Blood culture (routine x 2)     Status: None (Preliminary result)   Collection Time: 08/08/14  9:53 PM  Result Value Ref Range Status   Specimen Description BLOOD LEFT FOREARM  Final   Special Requests BOTTLES DRAWN AEROBIC ONLY 3CC  Final   Culture   Final           BLOOD CULTURE RECEIVED NO GROWTH TO DATE CULTURE WILL BE HELD FOR 5 DAYS BEFORE ISSUING A FINAL NEGATIVE REPORT Performed at [facility]    Report Status PENDING  Incomplete  Blood culture (routine x 2)  Status: None (Preliminary result)   Collection Time: 08/08/14  9:54 PM  Result Value Ref Range Status   Specimen Description BLOOD LEFT ANTECUBITAL  Final   Special Requests BOTTLES DRAWN AEROBIC AND ANAEROBIC 5CC  Final   Culture   Final           BLOOD CULTURE RECEIVED NO GROWTH TO DATE CULTURE WILL BE HELD FOR 5 DAYS BEFORE ISSUING A FINAL NEGATIVE REPORT Performed at [facility]    Report Status PENDING  Incomplete  Culture, sputum-assessment     Status: None   Collection Time: 08/09/14 12:22 PM  Result Value Ref Range Status   Specimen Description SPUTUM  Final   Special Requests NONE  Final   Sputum evaluation   Final    THIS SPECIMEN IS ACCEPTABLE. RESPIRATORY CULTURE REPORT TO FOLLOW.   Report Status 08/09/2014 FINAL  Final  Culture, respiratory (NON-Expectorated)     Status: None (Preliminary result)   Collection Time: 08/09/14 12:22 PM  Result Value Ref Range Status   Specimen Description SPUTUM  Final   Special Requests NONE  Final   Gram Stain   Final    ABUNDANT WBC PRESENT, PREDOMINANTLY PMN ABUNDANT SQUAMOUS EPITHELIAL CELLS PRESENT MODERATE GRAM POSITIVE RODS FEW GRAM POSITIVE COCCI IN PAIRS FEW GRAM NEGATIVE RODS    Culture   Final    Culture reincubated for better growth Performed at [facility]    Report Status PENDING  Incomplete    Michel Bickers, [facility] [phone] pager   [phone] cell 08/10/2014, 6:56 PM

## 2014-08-10 NOTE — Progress Notes (Signed)
Shannon Rush   DOB:1941-06-07   PY#:[id number]   KNL#:[id number]  Subjective: she was admitted for HCAP on 1/25, still febrile despite broad antibiotics. I was called by Dr. Algis Liming regarding her Revlimid.    Objective:  Filed Vitals:   08/10/14 1711  BP:   Pulse:   Temp: 102.7 F (39.3 C)  Resp:     Body mass index is 20.16 kg/(m^2).  Intake/Output Summary (Last 24 hours) at 08/10/14 1817 Last data filed at 08/10/14 1500  Gross per 24 hour  Intake   1300 ml  Output   1351 ml  Net    -51 ml     Sclerae unicteric  Oropharynx clear  No peripheral adenopathy  Lungs clear -- no rales or rhonchi  Heart regular rate and rhythm  Abdomen benign  MSK no focal spinal tenderness, no peripheral edema  Neuro nonfocal   CBG (last 3)  No results for input(s): GLUCAP in the last 72 hours.   Labs:  Lab Results  Component Value Date   WBC 2.8* 08/10/2014   HGB 9.4* 08/10/2014   HCT 27.9* 08/10/2014   MCV 104.1* 08/10/2014   PLT 132* 08/10/2014   NEUTROABS 2.4 08/10/2014    @LASTCHEMISTRY @  Urine Studies No results for input(s): UHGB, CRYS in the last 72 hours.  Invalid input(s): UACOL, UAPR, USPG, UPH, UTP, UGL, UKET, UBIL, UNIT, UROB, ULEU, UEPI, UWBC, URBC, UBAC, CAST, UCOM, BILUA  Basic Metabolic Panel:  Recent Labs Lab 08/08/14 2025 08/10/14 0455  NA 140 134*  K 3.8 3.3*  CL 106 108  CO2 23 21  GLUCOSE 119* 105*  BUN 19 16  CREATININE 0.78 0.80  CALCIUM 9.7 7.8*   GFR Estimated Creatinine Clearance: 58.6 mL/min (by C-G formula based on Cr of 0.8). Liver Function Tests:  Recent Labs Lab 08/08/14 2025  AST 36  ALT 27  ALKPHOS 72  BILITOT 0.9  PROT 7.4  ALBUMIN 4.3   No results for input(s): LIPASE, AMYLASE in the last 168 hours. No results for input(s): AMMONIA in the last 168 hours. Coagulation profile  Recent Labs Lab 08/09/14 0150  INR 1.11    CBC:  Recent Labs Lab 08/08/14 2025 08/10/14 0455  WBC 1.7* 2.8*  NEUTROABS 1.0* 2.4   HGB 11.3* 9.4*  HCT 33.6* 27.9*  MCV 104.3* 104.1*  PLT 162 132*   Cardiac Enzymes: No results for input(s): CKTOTAL, CKMB, CKMBINDEX, TROPONINI in the last 168 hours. BNP: Invalid input(s): POCBNP CBG: No results for input(s): GLUCAP in the last 168 hours. D-Dimer No results for input(s): DDIMER in the last 72 hours. Hgb A1c No results for input(s): HGBA1C in the last 72 hours. Lipid Profile No results for input(s): CHOL, HDL, LDLCALC, TRIG, CHOLHDL, LDLDIRECT in the last 72 hours. Thyroid function studies No results for input(s): TSH, T4TOTAL, T3FREE, THYROIDAB in the last 72 hours.  Invalid input(s): FREET3 Anemia work up No results for input(s): VITAMINB12, FOLATE, FERRITIN, TIBC, IRON, RETICCTPCT in the last 72 hours. Microbiology Recent Results (from the past 240 hour(s))  Urine culture     Status: None   Collection Time: 08/08/14  8:18 PM  Result Value Ref Range Status   Specimen Description URINE, CLEAN CATCH  Final   Special Requests NONE  Final   Colony Count NO GROWTH Performed at [facility]   Final   Culture NO GROWTH Performed at [facility]   Final   Report Status 08/10/2014 FINAL  Final  Blood culture (routine  x 2)     Status: None (Preliminary result)   Collection Time: 08/08/14  9:53 PM  Result Value Ref Range Status   Specimen Description BLOOD LEFT FOREARM  Final   Special Requests BOTTLES DRAWN AEROBIC ONLY 3CC  Final   Culture   Final           BLOOD CULTURE RECEIVED NO GROWTH TO DATE CULTURE WILL BE HELD FOR 5 DAYS BEFORE ISSUING A FINAL NEGATIVE REPORT Performed at [facility]    Report Status PENDING  Incomplete  Blood culture (routine x 2)     Status: None (Preliminary result)   Collection Time: 08/08/14  9:54 PM  Result Value Ref Range Status   Specimen Description BLOOD LEFT ANTECUBITAL  Final   Special Requests BOTTLES DRAWN AEROBIC AND ANAEROBIC 5CC  Final   Culture   Final           BLOOD CULTURE RECEIVED  NO GROWTH TO DATE CULTURE WILL BE HELD FOR 5 DAYS BEFORE ISSUING A FINAL NEGATIVE REPORT Performed at [facility]    Report Status PENDING  Incomplete  Culture, sputum-assessment     Status: None   Collection Time: 08/09/14 12:22 PM  Result Value Ref Range Status   Specimen Description SPUTUM  Final   Special Requests NONE  Final   Sputum evaluation   Final    THIS SPECIMEN IS ACCEPTABLE. RESPIRATORY CULTURE REPORT TO FOLLOW.   Report Status 08/09/2014 FINAL  Final  Culture, respiratory (NON-Expectorated)     Status: None (Preliminary result)   Collection Time: 08/09/14 12:22 PM  Result Value Ref Range Status   Specimen Description SPUTUM  Final   Special Requests NONE  Final   Gram Stain   Final    ABUNDANT WBC PRESENT, PREDOMINANTLY PMN ABUNDANT SQUAMOUS EPITHELIAL CELLS PRESENT MODERATE GRAM POSITIVE RODS FEW GRAM POSITIVE COCCI IN PAIRS FEW GRAM NEGATIVE RODS    Culture   Final    Culture reincubated for better growth Performed at [facility]    Report Status PENDING  Incomplete      Studies:  Dg Chest 2 View  08/08/2014   CLINICAL DATA:  Fever. Nausea. Aches and pains. Onset of symptoms at 3 p.m. today.  EXAM: CHEST  2 VIEW  COMPARISON:  Multiple priors dating back to 2014.  FINDINGS: Focal consolidation is present at the RIGHT lung base, overlying the RIGHT breast shadow. This likely represents a focus of pneumonia or aspiration pneumonitis. Thoracic kyphosis is present with multiple chronic thoracic spine compression fractures. Cardiopericardial silhouette appears within normal limits. Bilateral pleural apical scarring. Mediastinal contours are also within normal limits. Chronic deformity of the sternum compatible with an old fracture.  IMPRESSION: Dense RIGHT lower lobe consolidation, most compatible with pneumonia. Followup in 4-6 weeks to ensure radiographic clearing and exclude an underlying lesion is recommended.   Electronically Signed   By: Dereck Ligas M.D.   On: 08/08/2014 21:40    Assessment: 74 y.o. with MM was admitted for HCAP  1. HCAP -Agree with ID consult, broad antibiotics. She is at high risk for infection, including bacterial, virus, fungal infection due to her MM, Revlimid and history of bone marrrow transplant.   2. MM -Please hold on her Revlimid for now until she recovered from this episode of infection  3. Pancytopenia -secondary to revlimid -transfusion if Hb<8  I will follow as needed. Thanks for the call.     [name], MD 08/10/2014  6:17  PM

## 2014-08-11 ENCOUNTER — Other Ambulatory Visit: Payer: 59

## 2014-08-11 ENCOUNTER — Ambulatory Visit: Payer: 59 | Admitting: Hematology

## 2014-08-11 DIAGNOSIS — T7840XA Allergy, unspecified, initial encounter: Secondary | ICD-10-CM

## 2014-08-11 DIAGNOSIS — M7989 Other specified soft tissue disorders: Secondary | ICD-10-CM

## 2014-08-11 DIAGNOSIS — L270 Generalized skin eruption due to drugs and medicaments taken internally: Secondary | ICD-10-CM

## 2014-08-11 LAB — RESPIRATORY VIRUS PANEL
Adenovirus: NEGATIVE
Influenza A: NEGATIVE
Influenza B: NEGATIVE
Metapneumovirus: NEGATIVE
Parainfluenza 1: NEGATIVE
Parainfluenza 2: NEGATIVE
Parainfluenza 3: NEGATIVE
RHINOVIRUS: NEGATIVE
Respiratory Syncytial Virus A: NEGATIVE
Respiratory Syncytial Virus B: POSITIVE — AB

## 2014-08-11 LAB — CULTURE, RESPIRATORY: CULTURE: NORMAL

## 2014-08-11 LAB — CBC WITH DIFFERENTIAL/PLATELET
BASOS PCT: 0 % (ref 0–1)
Basophils Absolute: 0 10*3/uL (ref 0.0–0.1)
EOS PCT: 4 % (ref 0–5)
Eosinophils Absolute: 0.2 10*3/uL (ref 0.0–0.7)
HCT: 25.4 % — ABNORMAL LOW (ref 36.0–46.0)
Hemoglobin: 8.4 g/dL — ABNORMAL LOW (ref 12.0–15.0)
Lymphocytes Relative: 6 % — ABNORMAL LOW (ref 12–46)
Lymphs Abs: 0.2 10*3/uL — ABNORMAL LOW (ref 0.7–4.0)
MCH: 34.6 pg — ABNORMAL HIGH (ref 26.0–34.0)
MCHC: 33.1 g/dL (ref 30.0–36.0)
MCV: 104.5 fL — AB (ref 78.0–100.0)
MONO ABS: 0.2 10*3/uL (ref 0.1–1.0)
Monocytes Relative: 5 % (ref 3–12)
NEUTROS PCT: 85 % — AB (ref 43–77)
Neutro Abs: 3.5 10*3/uL (ref 1.7–7.7)
Platelets: 135 10*3/uL — ABNORMAL LOW (ref 150–400)
RBC: 2.43 MIL/uL — AB (ref 3.87–5.11)
RDW: 16.4 % — ABNORMAL HIGH (ref 11.5–15.5)
WBC: 4.1 10*3/uL (ref 4.0–10.5)

## 2014-08-11 LAB — MAGNESIUM: Magnesium: 1.7 mg/dL (ref 1.5–2.5)

## 2014-08-11 LAB — BASIC METABOLIC PANEL
Anion gap: 5 (ref 5–15)
BUN: 17 mg/dL (ref 6–23)
CHLORIDE: 110 mmol/L (ref 96–112)
CO2: 23 mmol/L (ref 19–32)
Calcium: 7.9 mg/dL — ABNORMAL LOW (ref 8.4–10.5)
Creatinine, Ser: 0.89 mg/dL (ref 0.50–1.10)
GFR calc Af Amer: 73 mL/min — ABNORMAL LOW (ref >90)
GFR calc non Af Amer: 63 mL/min — ABNORMAL LOW (ref >90)
GLUCOSE: 115 mg/dL — AB (ref 70–99)
Potassium: 3.6 mmol/L (ref 3.5–5.1)
Sodium: 138 mmol/L (ref 135–145)

## 2014-08-11 LAB — CULTURE, RESPIRATORY W GRAM STAIN

## 2014-08-11 LAB — VANCOMYCIN, TROUGH: Vancomycin Tr: 9.2 ug/mL — ABNORMAL LOW (ref 10.0–20.0)

## 2014-08-11 MED ORDER — POTASSIUM CHLORIDE CRYS ER 20 MEQ PO TBCR
30.0000 meq | EXTENDED_RELEASE_TABLET | Freq: Once | ORAL | Status: AC
  Administered 2014-08-11: 30 meq via ORAL
  Filled 2014-08-11 (×2): qty 1

## 2014-08-11 MED ORDER — DIPHENHYDRAMINE HCL 25 MG PO CAPS
25.0000 mg | ORAL_CAPSULE | Freq: Four times a day (QID) | ORAL | Status: DC | PRN
  Administered 2014-08-11 – 2014-08-13 (×7): 25 mg via ORAL
  Filled 2014-08-11 (×7): qty 1

## 2014-08-11 MED ORDER — ACETAMINOPHEN 325 MG PO TABS
650.0000 mg | ORAL_TABLET | Freq: Four times a day (QID) | ORAL | Status: DC | PRN
  Administered 2014-08-11 – 2014-08-13 (×3): 650 mg via ORAL
  Filled 2014-08-11 (×3): qty 2

## 2014-08-11 MED ORDER — DIPHENHYDRAMINE HCL 50 MG/ML IJ SOLN
25.0000 mg | Freq: Once | INTRAMUSCULAR | Status: AC
  Administered 2014-08-11: 25 mg via INTRAVENOUS
  Filled 2014-08-11: qty 1

## 2014-08-11 MED ORDER — VANCOMYCIN HCL IN DEXTROSE 750-5 MG/150ML-% IV SOLN
750.0000 mg | Freq: Two times a day (BID) | INTRAVENOUS | Status: DC
  Administered 2014-08-11: 750 mg via INTRAVENOUS
  Filled 2014-08-11 (×2): qty 150

## 2014-08-11 MED ORDER — IMIPENEM-CILASTATIN 500 MG IV SOLR
500.0000 mg | Freq: Three times a day (TID) | INTRAVENOUS | Status: DC
  Filled 2014-08-11: qty 500

## 2014-08-11 MED ORDER — SODIUM CHLORIDE 0.9 % IV SOLN
250.0000 mg | Freq: Four times a day (QID) | INTRAVENOUS | Status: DC
  Administered 2014-08-11 – 2014-08-13 (×8): 250 mg via INTRAVENOUS
  Filled 2014-08-11 (×10): qty 250

## 2014-08-11 NOTE — Progress Notes (Signed)
Patient ID: Shannon Rush, female   DOB: Jul 06, 1941, 74 y.o.   MRN: [id number]         [facility] for Infectious Disease    Date of Admission:  08/08/2014           Day 4 vancomycin                     Day 1 imipenem  Principal Problem:   HCAP (healthcare-associated pneumonia) Active Problems:   Multiple myeloma   History of peripheral stem cell transplant   Anemia, macrocytic   Osteoporosis   History of ST elevation myocardial infarction (STEMI)   Coronary atherosclerosis of native coronary artery   Pancytopenia   . aspirin EC  81 mg Oral QODAY  . atorvastatin  40 mg Oral q1800  . calcium carbonate  1,250 mg Oral Daily  . cholecalciferol  400 Units Oral Daily  . clopidogrel  75 mg Oral Daily  . Difluprednate  1 drop Left Eye Daily  . feeding supplement (ENSURE COMPLETE)  237 mL Oral BID BM  . heparin  5,000 Units Subcutaneous 3 times per day  . imipenem-cilastatin  250 mg Intravenous 4 times per day  . ketorolac  1 drop Left Eye QID  . metoprolol succinate  25 mg Oral Daily  . vancomycin  750 mg Intravenous Q12H    Subjective: Overnight she developed increased swelling of her hands associated with a pruritic red rash over her body. She feels like her cough may be slightly better and she is not bringing up as much green sputum. She did not sleep well last night and is very fatigued.  Review of Systems: Pertinent items are noted in HPI.  Past Medical History  Diagnosis Date  . Multiple myeloma 07/01/2012  . Unspecified deficiency anemia   . OSTEOPOROSIS 06/11/2010    Multiple compression fractures; and spontaneous fracture of sternum Qualifier: Diagnosis of  By: [name], [name]     . 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 Cord compression 07/13/12    MRI- diffuse myeloma involvement of T-L spine  . History of radiation therapy 07/13/12-07/27/12    spinal cord compression T3-T10,left scapula  . CAD (coronary artery disease)     a. inf STEMI (in  [place] >>> [facility]) >>> LHC (8/15- [place]):  pRCA 60%, mid to dist RCA 50% >>> med rx  . Hx of echocardiogram     Echo (03/02/14-[facility]):  Normal LVEF without significant regional wall motion abnormalities. Mild     History  Substance Use Topics  . Smoking status: Never Smoker   . Smokeless tobacco: Never Used  . Alcohol Use: 3.5 oz/week    7 drink(s) per week    Family History  Problem Relation Age of Onset  . Glaucoma Mother   . Heart disease Mother   . Heart disease Father   . Cancer Brother     stage IV prostate CA   Allergies  Allergen Reactions  . Zosyn [Piperacillin Sod-Tazobactam So] Rash    Itching, rash and swelling of extremities  . Zithromax [Azithromycin]     Full body rash  . Ciprofloxacin Rash    Throwing up and stomach pain     OBJECTIVE: Blood pressure 96/56, pulse 90, temperature 98.6 F (37 C), temperature source Oral, resp. rate 18, height 5' 7"  (1.702 m), weight 128 lb 12 oz (58.4 kg), SpO2 95 %.  General:  she is sitting up on the side of her head. She does appear tired  Skin:  fine red macular rash that blanches over her trunk and extremities. Marked palmar erythema or swelling of her hands. The redness and swelling of her cheeks that I noted last night is better Lungs:  bibasilar crackles right greater than left unchanged Cor:  regular S1 and S2 with no murmur  Lab Results Lab Results  Component Value Date   WBC 4.1 08/11/2014   HGB 8.4* 08/11/2014   HCT 25.4* 08/11/2014   MCV 104.5* 08/11/2014   PLT 135* 08/11/2014    Lab Results  Component Value Date   CREATININE 0.89 08/11/2014   BUN 17 08/11/2014   NA 138 08/11/2014   K 3.6 08/11/2014   CL 110 08/11/2014   CO2 23 08/11/2014    Lab Results  Component Value Date   ALT 27 08/08/2014   AST 36 08/08/2014   ALKPHOS 72 08/08/2014   BILITOT 0.9 08/08/2014     Microbiology: Recent Results (from the past 240 hour(s))  Urine  culture     Status: None   Collection Time: 08/08/14  8:18 PM  Result Value Ref Range Status   Specimen Description URINE, CLEAN CATCH  Final   Special Requests NONE  Final   Colony Count NO GROWTH Performed at [facility]   Final   Culture NO GROWTH Performed at [facility]   Final   Report Status 08/10/2014 FINAL  Final  Blood culture (routine x 2)     Status: None (Preliminary result)   Collection Time: 08/08/14  9:53 PM  Result Value Ref Range Status   Specimen Description BLOOD LEFT FOREARM  Final   Special Requests BOTTLES DRAWN AEROBIC ONLY 3CC  Final   Culture   Final           BLOOD CULTURE RECEIVED NO GROWTH TO DATE CULTURE WILL BE HELD FOR 5 DAYS BEFORE ISSUING A FINAL NEGATIVE REPORT Performed at [facility]    Report Status PENDING  Incomplete  Blood culture (routine x 2)     Status: None (Preliminary result)   Collection Time: 08/08/14  9:54 PM  Result Value Ref Range Status   Specimen Description BLOOD LEFT ANTECUBITAL  Final   Special Requests BOTTLES DRAWN AEROBIC AND ANAEROBIC 5CC  Final   Culture   Final           BLOOD CULTURE RECEIVED NO GROWTH TO DATE CULTURE WILL BE HELD FOR 5 DAYS BEFORE ISSUING A FINAL NEGATIVE REPORT Performed at [facility]    Report Status PENDING  Incomplete  Respiratory virus panel     Status: Abnormal   Collection Time: 08/09/14 12:20 PM  Result Value Ref Range Status   Respiratory Syncytial Virus A Negative Negative Final   Respiratory Syncytial Virus B Positive (A) Negative Final   Influenza A Negative Negative Final   Influenza B Negative Negative Final   Parainfluenza 1 Negative Negative Final   Parainfluenza 2 Negative Negative Final   Parainfluenza 3 Negative Negative Final   Metapneumovirus Negative Negative Final   Rhinovirus Negative Negative Final   Adenovirus Negative Negative Final    Comment: (NOTE) Performed At: [facility] [address] [place], [place]  [id number] Lindon Romp MD [id number]   Culture, sputum-assessment     Status: None   Collection Time: 08/09/14 12:22 PM  Result Value Ref Range Status   Specimen Description SPUTUM  Final   Special Requests NONE  Final   Sputum evaluation   Final    THIS SPECIMEN IS ACCEPTABLE. RESPIRATORY CULTURE REPORT TO FOLLOW.   Report Status 08/09/2014 FINAL  Final  Culture, respiratory (NON-Expectorated)     Status: None   Collection Time: 08/09/14 12:22 PM  Result Value Ref Range Status   Specimen Description SPUTUM  Final   Special Requests NONE  Final   Gram Stain   Final    ABUNDANT WBC PRESENT, PREDOMINANTLY PMN ABUNDANT SQUAMOUS EPITHELIAL CELLS PRESENT MODERATE GRAM POSITIVE RODS FEW GRAM POSITIVE COCCI IN PAIRS FEW GRAM NEGATIVE RODS    Culture   Final    NORMAL OROPHARYNGEAL FLORA Performed at [facility]    Report Status 08/11/2014 FINAL  Final    Assessment: She has developed an allergic reaction, probably to one of her antibiotics with rash and swelling of her hands. When I saw her last night she had some erythema of her cheeks and facial swelling. Therefore, I'm unclear if the reaction is due to piperacillin tazobactam, vancomycin or cefepime. An allergic reaction could've been contributing to her recent persistent fever. Her temperature appears to be coming down. I will continue imipenem for now for her healthcare associated pneumonia stop vancomycin.  Plan: 1. continue imipenem 2. stop vancomycin  Michel Bickers, MD [facility] for Infectious [place] [phone] pager   [phone] cell 08/11/2014, 4:05 PM

## 2014-08-11 NOTE — Progress Notes (Addendum)
ANTIBIOTIC CONSULT NOTE - FOLLOW UP  Pharmacy Consult for Vancomycin, Primaxin Indication: Febrile Neutropenia, HCAP  Allergies  Allergen Reactions  . Zosyn [Piperacillin Sod-Tazobactam So] Rash    Itching, rash and swelling of extremities  . Zithromax [Azithromycin]     Full body rash  . Ciprofloxacin Rash    Throwing up and stomach pain     Patient Measurements: Height: 5' 7"  (170.2 cm) Weight: 128 lb 12 oz (58.4 kg) IBW/kg (Calculated) : 61.6   Vital Signs: Temp: 97.8 F (36.6 C) (01/28 0521) Temp Source: Oral (01/28 0521) BP: 90/43 mmHg (01/27 2134) Pulse Rate: 84 (01/28 0521) Intake/Output from previous day: 01/27 0701 - 01/28 0700 In: 1948.8 [P.O.:660; I.V.:1188.8; IV Piggyback:100] Out: 851 [Urine:850; Stool:1] Intake/Output from this shift:    Labs:  Recent Labs  08/08/14 2025 08/10/14 0455 08/11/14 0533  WBC 1.7* 2.8* 4.1  HGB 11.3* 9.4* 8.4*  PLT 162 132* 135*  CREATININE 0.78 0.80 0.89   Estimated Creatinine Clearance: 52.7 mL/min (by C-G formula based on Cr of 0.89).  Recent Labs  08/11/14 0903  VANCOTROUGH 9.2*     Assessment: 32 yoF admitted on 1/26 with fever, cough, SOB and mild chest pain. PMH includes multiple myeloma, CAD, hx of stem cell transplant ([facility]), and recent dental infection. CXR shows RLL pneumonia. Pharmacy was initially consulted to dose vancomycin and cefepime.  ID changed cefepime to zosyn for better anaerobic coverage with recent dental infection, but Zosyn was d/c after patient reported an allergic reaction (swelling of hands and feet.)  Pharmacy is consulted to dose Primaxin today per ID.  1/26 >> vanco >> 1/26 >> cefepime>> 1/27 1/27 >> zosyn >> 1/27 1/28 >> Primaxin >>  Today, 08/11/2014:  Tmax: 102.7  WBCs: increased to 4.1 (ANC 3.5)  Renal: SCr 0.89, CrCl ~ 53 ml/min  Lactic acid: 1.3 (1/26)  Cultures remain negative except for Positive Strep pneumo urinary Ag.  Vancomycin trough level 9.2, below  goal   Goal of Therapy:  Vancomycin trough level 15-20 mcg/ml  Appropriate abx dosing, eradication of infection.   Plan:   Primaxin 250m IV q6h  Increase to Vancomycin 750 mg IV q12h.  Recheck Vanc trough at steady state.  Follow up renal fxn and culture results.   CGretta ArabPharmD, BCPS Pager [phone]/28/2016 8:46 AM

## 2014-08-11 NOTE — Progress Notes (Addendum)
Patient ID: Shannon Rush, female   DOB: [birth year], 74 y.o.   MRN: [id number]  [facility] PROGRESS NOTE  AHNA KONKLE ULA:[id number] DOB: 31-Jan-1941 DOA: 08/08/2014 PCP: [name], MD  Primary Oncologist: Dr. [name].   Brief narrative:    74 y.o. female with multiple myeloma (on chemotherapy), coronary artery disease, history of stem cell transplantation in [facility] Aug 2014, who presented with fever, productive cough of green sputum, shortness of breath and mild chest pain. Work up in the ED demonstrated right lower lobe pneumonia per chest x-ray; WBC 1.7, negative urinalysis.   Assessment/Plan:    Principal Problem:   Sepsis secondary to RLL HCAP (healthcare-associated pneumonia)- Pneumococcal - pt started on broad spectrum ABX Vancomycin and Cefepime due to immunosuppressed status (receiving chemo) - Sputum Gram stain: Moderate gram-positive rods, few gram-positive cocci in pairs and few gram-negative rods. Sputum culture normal OP flora. Urine culture: No growth. Blood cultures 2: Negative to date. RSV panel: Pending. Urine Legionella antigen: Negative. Urine streptococcal antigen positive. - Clinically better but continued to spike high fever (102.28F on 1/27 at 5:11 PM) despite 48 hours of IV antibiotics. Requested ID consultation given her underlying immunocompromised state: Antibiotics were switched from Cefepime to Zosyn but patient developed allergy reaction overnight and has been switched to Primaxin. Continue Vancomycin. - Hold Revlimid. We will request HIV antibodies.   Multiple myeloma  - s/p stem cell transplantation (autotransplant) in August 2014 at [facility] - has been followed by [facility]. Last seen 02/2014 - followed by Dr. Burr Medico  here, last seen was 3 weeks ago, has an appointment on next Thursday. Dr. Burr Medico visited with patient in the hospital on 1/27. Discussed with Dr. Pilar Plate who advised holding Revlimid and suggested ID consultation.  - MM markers  demonstrate a very good response and vaccines are up to date - pt is on revlimid 2.5 mg daily for 3 weeks on and then one week off, supposed to restart Revlimid on 08/09/14-Held   Active Problems:   Pancytopenia due to chemotherapy - WBC normalized. Thrombocytopenia stable. Hemoglobin gradually dropping. Transfuse if hemoglobin less than 8 g per DL. No reported bleeding.    Coronary atherosclerosis of native coronary artery - asymptomatic, continue aspirin and plavix    HLD - continue statin     HTN - reasonable inpatient control  - continue Metoprolol   Hypokalemia - Replace and follow    DVT prophylaxis - Heparin SQ   Code Status: Full.  Family Communication:  Offered to discuss with family which patient kindly declined on 1/28 Disposition Plan: Home when stable. Pt reports she was scheduled to travel to [place] next week- suggested she postpone trip due to current hospitalization.   IV access:  Peripheral IV Procedures and diagnostic studies:    Dg Chest 2 View  08/08/2014    Dense RIGHT lower lobe consolidation, c/w pneumonia. Medical Consultants:  Infectious disease Other Consultants:  None IAnti-Infectives:   Vancomycin 1/26 --> Maxipime 1/26 -->  Time spent: 98 minutes  Jebadiah Imperato, MD, FACP, FHM. [facility] Pager [phone]  If 7PM-7AM, please contact night-coverage [email or web address] Password [email or web address] 08/11/2014, 1:19 PM   LOS: 3 days   HPI/Subjective: Overnight events noted. Had fever of 102.7 on 1/27 PM. Mild mostly dry cough. No dyspnea. Apparently had generalized skin rash and pruritus post Zosyn which has since resolved. Still complains of swelling and tightness off hand/fingers, feet/toes.  Objective: Filed Vitals:   08/11/14 5003 08/11/14 0804 08/11/14 0952 08/11/14 1301  BP:  101/51 96/56  Pulse: 84  63 90  Temp: 97.8 F (36.6 C)   98.6 F (37 C)  TempSrc: Oral   Oral  Resp: 18   18  Height:      Weight:      SpO2: 94% 95%  95%     Intake/Output Summary (Last 24 hours) at 08/11/14 1319 Last data filed at 08/11/14 1135  Gross per 24 hour  Intake 1598.75 ml  Output   1651 ml  Net -52.25 ml    Exam:   General:  Pt is alert, follows commands appropriately, not in acute distress  Cardiovascular: Regular rate and rhythm, S1/S2, no rubs, no gallops  Respiratory: Diminished breath sounds in the bases with occasional basal crackles. Otherwise clear to auscultation. No increased work of breathing.   Extremities: pulses DP and PT palpable bilaterally. Symmetric 5 x 5 power. Chronic arthritic changes of bilateral fingers. Fingers, hand and feet slightly puffy but no acute findings.  Neuro: Grossly nonfocal. Alert and oriented.  Data Reviewed: Basic Metabolic Panel:  Recent Labs Lab 08/08/14 2025 08/10/14 0455 08/11/14 0533  NA 140 134* 138  K 3.8 3.3* 3.6  CL 106 108 110  CO2 23 21 23   GLUCOSE 119* 105* 115*  BUN 19 16 17   CREATININE 0.78 0.80 0.89  CALCIUM 9.7 7.8* 7.9*  MG  --   --  1.7   Liver Function Tests:  Recent Labs Lab 08/08/14 2025  AST 36  ALT 27  ALKPHOS 72  BILITOT 0.9  PROT 7.4  ALBUMIN 4.3   CBC:  Recent Labs Lab 08/08/14 2025 08/10/14 0455 08/11/14 0533  WBC 1.7* 2.8* 4.1  NEUTROABS 1.0* 2.4 3.5  HGB 11.3* 9.4* 8.4*  HCT 33.6* 27.9* 25.4*  MCV 104.3* 104.1* 104.5*  PLT 162 132* 135*    Recent Results (from the past 240 hour(s))  Urine culture     Status: None   Collection Time: 08/08/14  8:18 PM  Result Value Ref Range Status   Specimen Description URINE, CLEAN CATCH  Final   Special Requests NONE  Final   Colony Count NO GROWTH Performed at [facility]   Final   Culture NO GROWTH Performed at [facility]   Final   Report Status 08/10/2014 FINAL  Final  Blood culture (routine x 2)     Status: None (Preliminary result)   Collection Time: 08/08/14  9:53 PM  Result Value Ref Range Status   Specimen Description BLOOD LEFT FOREARM  Final    Special Requests BOTTLES DRAWN AEROBIC ONLY 3CC  Final   Culture   Final           BLOOD CULTURE RECEIVED NO GROWTH TO DATE CULTURE WILL BE HELD FOR 5 DAYS BEFORE ISSUING A FINAL NEGATIVE REPORT Performed at [facility]    Report Status PENDING  Incomplete  Blood culture (routine x 2)     Status: None (Preliminary result)   Collection Time: 08/08/14  9:54 PM  Result Value Ref Range Status   Specimen Description BLOOD LEFT ANTECUBITAL  Final   Special Requests BOTTLES DRAWN AEROBIC AND ANAEROBIC 5CC  Final   Culture   Final           BLOOD CULTURE RECEIVED NO GROWTH TO DATE CULTURE WILL BE HELD FOR 5 DAYS BEFORE ISSUING A FINAL NEGATIVE REPORT Performed at [facility]    Report Status PENDING  Incomplete  Culture, sputum-assessment     Status: None  Collection Time: 08/09/14 12:22 PM  Result Value Ref Range Status   Specimen Description SPUTUM  Final   Special Requests NONE  Final   Sputum evaluation   Final    THIS SPECIMEN IS ACCEPTABLE. RESPIRATORY CULTURE REPORT TO FOLLOW.   Report Status 08/09/2014 FINAL  Final  Culture, respiratory (NON-Expectorated)     Status: None   Collection Time: 08/09/14 12:22 PM  Result Value Ref Range Status   Specimen Description SPUTUM  Final   Special Requests NONE  Final   Gram Stain   Final    ABUNDANT WBC PRESENT, PREDOMINANTLY PMN ABUNDANT SQUAMOUS EPITHELIAL CELLS PRESENT MODERATE GRAM POSITIVE RODS FEW GRAM POSITIVE COCCI IN PAIRS FEW GRAM NEGATIVE RODS    Culture   Final    NORMAL OROPHARYNGEAL FLORA Performed at [facility]    Report Status 08/11/2014 FINAL  Final     Scheduled Meds: . albuterol  2.5 mg Nebulization TID  . aspirin EC  81 mg Oral QODAY  . atorvastatin  40 mg Oral q1800  . calcium carbonate  1,250 mg Oral Daily  . cholecalciferol  400 Units Oral Daily  . clopidogrel  75 mg Oral Daily  . Difluprednate  1 drop Left Eye Daily  . feeding supplement (ENSURE COMPLETE)  237 mL Oral BID  BM  . heparin  5,000 Units Subcutaneous 3 times per day  . imipenem-cilastatin  250 mg Intravenous 4 times per day  . ketorolac  1 drop Left Eye QID  . metoprolol succinate  25 mg Oral Daily  . vancomycin  750 mg Intravenous Q12H   Continuous Infusions:

## 2014-08-11 NOTE — Progress Notes (Signed)
Spoke with Mickel Baas from infection prevention about pt positive RSV-B from respiratory panel; she said pt did not need to be on precautions.

## 2014-08-11 NOTE — Progress Notes (Signed)
RN paged this NP secondary to pt developing edema of the hands and feet over the past 30 mins or so. Her only new med is Zosyn of which she has had one dose this past evening. Vitals stable, no SOB, wheezing, angioedema, or stridor.  NP to bedside. S: Pt has 2 allergies to other abx. Says this is what has happened before. The swelling feels tight. She is "itchy". No SOB or wheezing.  O: VSS. Appears well, in NAD. Fine, macular-papular rash to arms, legs, and trunk. Slight swelling of extremities in a stocking/glove pattern. Lungs are clear. No respiratory distress. No swelling of face, lips or tongue.  A/P: 1. Drug rash to Zosyn-Zosyn d/c'd. Benadryl 25mg  IV now and 25mg  po q6hr prn. Zosyn added to allergy list. Watch pt closely. ID started the Zosyn yesterday and are consulting for infection/abx. Will report off to attending and ID can choose a replacement today.  Clance Boll, NP [facility]

## 2014-08-12 DIAGNOSIS — R21 Rash and other nonspecific skin eruption: Secondary | ICD-10-CM

## 2014-08-12 LAB — COMPREHENSIVE METABOLIC PANEL
ALK PHOS: 113 U/L (ref 39–117)
ALT: 54 U/L — ABNORMAL HIGH (ref 0–35)
ANION GAP: 6 (ref 5–15)
AST: 51 U/L — ABNORMAL HIGH (ref 0–37)
Albumin: 2.5 g/dL — ABNORMAL LOW (ref 3.5–5.2)
BUN: 13 mg/dL (ref 6–23)
CO2: 22 mmol/L (ref 19–32)
Calcium: 8.1 mg/dL — ABNORMAL LOW (ref 8.4–10.5)
Chloride: 110 mmol/L (ref 96–112)
Creatinine, Ser: 0.72 mg/dL (ref 0.50–1.10)
GFR calc Af Amer: >90 mL/min (ref >90)
GFR calc non Af Amer: 84 mL/min — ABNORMAL LOW (ref >90)
GLUCOSE: 137 mg/dL — AB (ref 70–99)
Potassium: 3.6 mmol/L (ref 3.5–5.1)
SODIUM: 138 mmol/L (ref 135–145)
TOTAL PROTEIN: 5.3 g/dL — AB (ref 6.0–8.3)
Total Bilirubin: 0.6 mg/dL (ref 0.3–1.2)

## 2014-08-12 LAB — CBC WITH DIFFERENTIAL/PLATELET
Basophils Absolute: 0 10*3/uL (ref 0.0–0.1)
Basophils Relative: 0 % (ref 0–1)
EOS PCT: 7 % — AB (ref 0–5)
Eosinophils Absolute: 0.2 10*3/uL (ref 0.0–0.7)
HCT: 25.3 % — ABNORMAL LOW (ref 36.0–46.0)
HEMOGLOBIN: 8.5 g/dL — AB (ref 12.0–15.0)
Lymphocytes Relative: 7 % — ABNORMAL LOW (ref 12–46)
Lymphs Abs: 0.2 10*3/uL — ABNORMAL LOW (ref 0.7–4.0)
MCH: 35 pg — ABNORMAL HIGH (ref 26.0–34.0)
MCHC: 33.6 g/dL (ref 30.0–36.0)
MCV: 104.1 fL — ABNORMAL HIGH (ref 78.0–100.0)
MONO ABS: 0.1 10*3/uL (ref 0.1–1.0)
Monocytes Relative: 4 % (ref 3–12)
Neutro Abs: 2.6 10*3/uL (ref 1.7–7.7)
Neutrophils Relative %: 82 % — ABNORMAL HIGH (ref 43–77)
Platelets: 136 10*3/uL — ABNORMAL LOW (ref 150–400)
RBC: 2.43 MIL/uL — ABNORMAL LOW (ref 3.87–5.11)
RDW: 16.2 % — AB (ref 11.5–15.5)
WBC: 3.1 10*3/uL — ABNORMAL LOW (ref 4.0–10.5)

## 2014-08-12 LAB — TYPE AND SCREEN
ABO/RH(D): A POS
ANTIBODY SCREEN: NEGATIVE

## 2014-08-12 NOTE — Progress Notes (Signed)
CARE MANAGEMENT NOTE 08/12/2014  Patient:  Shannon Rush, Shannon Rush   Account Number:  [id number]  Date Initiated:  08/09/2014  Documentation initiated by:  Karl Bales  Subjective/Objective Assessment:   PT ADMITTED WITH [facility] HCAP, SOB     Action/Plan:   FROM HOME   Anticipated DC Date:  08/14/2014   Anticipated DC Plan:  [place]  CM consult      Choice offered to / List presented to:             Status of service:  In process, will continue to follow Medicare Important Message given?   (If response is "NO", the following Medicare IM given date fields will be blank) Date Medicare IM given:   Medicare IM given by:   Date Additional Medicare IM given:   Additional Medicare IM given by:    Discharge Disposition:    Per UR Regulation:  Reviewed for med. necessity/level of care/duration of stay  If discussed at [place] of Stay Meetings, dates discussed:    Comments:  08/09/14 [email or web address], RN, BSN CHART REVIEWED.

## 2014-08-12 NOTE — Progress Notes (Addendum)
Patient ID: Shannon Rush, female   DOB: 03/11/1941, 74 y.o.   MRN: [id number]  [facility] PROGRESS NOTE  BASHA KRYGIER XYV:[id number] DOB: 1941-07-02 DOA: 08/08/2014 PCP: [name], MD  Primary Oncologist: Dr. [name].   Brief narrative:    74 y.o. female with multiple myeloma (on chemotherapy), coronary artery disease, history of stem cell transplantation in [facility] Aug 2014, who presented with fever, productive cough of green sputum, shortness of breath and mild chest pain. Work up in the ED demonstrated right lower lobe pneumonia per chest x-ray; WBC 1.7, negative urinalysis.   Assessment/Plan:    Principal Problem:   Sepsis secondary to RLL HCAP (healthcare-associated pneumonia)- Pneumococcal - pt started on broad spectrum ABX Vancomycin and Cefepime due to immunosuppressed status (receiving chemo) - Sputum Gram stain: Moderate gram-positive rods, few gram-positive cocci in pairs and few gram-negative rods. Sputum culture normal OP flora. Urine culture: No growth. Blood cultures 2: Negative to date. RSV panel: Pending. Urine Legionella antigen: Negative. Urine streptococcal antigen positive. - Clinically better but continued to spike high fever (102.85F on 1/27 at 5:11 PM) despite 48 hours of IV antibiotics. Requested ID consultation given her underlying immunocompromised state: Antibiotics were switched from Cefepime to Zosyn but patient developed allergy reaction overnight and has been switched to Primaxin. Vancomycin discontinued. - Hold Revlimid. We will request HIV antibodies. - As per ID follow-up/discussed with Dr. Megan Salon: 1-2 more days of IV Primaxin, then discharge home. - RSV B +   Multiple myeloma  - s/p stem cell transplantation (autotransplant) in August 2014 at [facility] - has been followed by [facility]. Last seen 02/2014 - followed by Dr. Burr Medico  here, last seen was 3 weeks ago, has an appointment on next Thursday. Dr. Burr Medico visited with patient in the  hospital on 1/27. Discussed with Dr. Pilar Plate who advised holding Revlimid and suggested ID consultation.  - MM markers demonstrate a very good response and vaccines are up to date - pt is on revlimid 2.5 mg daily for 3 weeks on and then one week off, supposed to restart Revlimid on 08/09/14-Held   Active Problems:   Pancytopenia due to chemotherapy - WBC fluctuating and slightly low today. Thrombocytopenia stable. Hemoglobin gradually dropping. Transfuse if hemoglobin less than 8 g per DL. No reported bleeding. Hemoglobin stable over last 24 hours.    Coronary atherosclerosis of native coronary artery - asymptomatic, continue aspirin and plavix    HLD - continue statin     HTN - reasonable inpatient control  - continue Metoprolol   Hypokalemia - Replace and follow  Skin rash - Possibly related to antibiotics-unclear which one. Improving.    DVT prophylaxis - Heparin SQ   Code Status: Full.  Family Communication:  Discussed with patient's daughter Dr. Ricki Rodriguez on 08/12/14. Updated care and answered questions.  Disposition Plan: Home when stable-possibly 1-2 days.   IV access:  Peripheral IV Procedures and diagnostic studies:    Dg Chest 2 View  08/08/2014    Dense RIGHT lower lobe consolidation, c/w pneumonia. Medical Consultants:  Infectious disease Other Consultants:  None IAnti-Infectives:   Vancomycin 1/26 -->1/27 Maxipime 1/25 -->1/27 Primaxin 1/28> Zosyn- single dose 1/27   Time spent: 59 minutes  Isacc Turney, MD, FACP, FHM. [facility] Pager [phone]  If 7PM-7AM, please contact night-coverage [email or web address] Password [facility] 08/12/2014, 11:53 AM   LOS: 4 days   HPI/Subjective: Overall feels better. Slept well last night. Skin rash and itching decreasing. Cough improving-minimal dry. Denies dyspnea.  Objective:  Filed Vitals:   08/11/14 0952 08/11/14 1301 08/11/14 1939 08/11/14 2100  BP: 101/51 96/56 119/52   Pulse: 63 90 104   Temp:  98.6  F (37 C) 100.4 F (38 C) 98 F (36.7 C)  TempSrc:  Oral Oral Oral  Resp:  18 20   Height:      Weight:      SpO2:  95% 93%     Intake/Output Summary (Last 24 hours) at 08/12/14 1153 Last data filed at 08/12/14 0945  Gross per 24 hour  Intake    880 ml  Output   1900 ml  Net  -1020 ml    Exam:   General:  Pt is alert, follows commands appropriately, not in acute distress  Cardiovascular: Regular rate and rhythm, S1/S2, no rubs, no gallops  Respiratory: Diminished breath sounds in the bases with occasional basal crackles-Better. Otherwise clear to auscultation. No increased work of breathing.   Extremities: pulses DP and PT palpable bilaterally. Symmetric 5 x 5 power. Chronic arthritic changes of bilateral fingers. Fingers, hand and feet slightly puffy but no acute findings.  Neuro: Grossly nonfocal. Alert and oriented.  Skin: Fine erythematous macular rash that blanches, seen over trunk and lower extremities.  Data Reviewed: Basic Metabolic Panel:  Recent Labs Lab 08/08/14 2025 08/10/14 0455 08/11/14 0533 08/12/14 0555  NA 140 134* 138 138  K 3.8 3.3* 3.6 3.6  CL 106 108 110 110  CO2 23 21 23 22   GLUCOSE 119* 105* 115* 137*  BUN 19 16 17 13   CREATININE 0.78 0.80 0.89 0.72  CALCIUM 9.7 7.8* 7.9* 8.1*  MG  --   --  1.7  --    Liver Function Tests:  Recent Labs Lab 08/08/14 2025 08/12/14 0555  AST 36 51*  ALT 27 54*  ALKPHOS 72 113  BILITOT 0.9 0.6  PROT 7.4 5.3*  ALBUMIN 4.3 2.5*   CBC:  Recent Labs Lab 08/08/14 2025 08/10/14 0455 08/11/14 0533 08/12/14 0555  WBC 1.7* 2.8* 4.1 3.1*  NEUTROABS 1.0* 2.4 3.5 2.6  HGB 11.3* 9.4* 8.4* 8.5*  HCT 33.6* 27.9* 25.4* 25.3*  MCV 104.3* 104.1* 104.5* 104.1*  PLT 162 132* 135* 136*    Recent Results (from the past 240 hour(s))  Urine culture     Status: None   Collection Time: 08/08/14  8:18 PM  Result Value Ref Range Status   Specimen Description URINE, CLEAN CATCH  Final   Special Requests  NONE  Final   Colony Count NO GROWTH Performed at [facility]   Final   Culture NO GROWTH Performed at [facility]   Final   Report Status 08/10/2014 FINAL  Final  Blood culture (routine x 2)     Status: None (Preliminary result)   Collection Time: 08/08/14  9:53 PM  Result Value Ref Range Status   Specimen Description BLOOD LEFT FOREARM  Final   Special Requests BOTTLES DRAWN AEROBIC ONLY 3CC  Final   Culture   Final           BLOOD CULTURE RECEIVED NO GROWTH TO DATE CULTURE WILL BE HELD FOR 5 DAYS BEFORE ISSUING A FINAL NEGATIVE REPORT Performed at [facility]    Report Status PENDING  Incomplete  Blood culture (routine x 2)     Status: None (Preliminary result)   Collection Time: 08/08/14  9:54 PM  Result Value Ref Range Status   Specimen Description BLOOD LEFT ANTECUBITAL  Final   Special Requests  BOTTLES DRAWN AEROBIC AND ANAEROBIC 5CC  Final   Culture   Final           BLOOD CULTURE RECEIVED NO GROWTH TO DATE CULTURE WILL BE HELD FOR 5 DAYS BEFORE ISSUING A FINAL NEGATIVE REPORT Performed at [facility]    Report Status PENDING  Incomplete  Respiratory virus panel     Status: Abnormal   Collection Time: 08/09/14 12:20 PM  Result Value Ref Range Status   Respiratory Syncytial Virus A Negative Negative Final   Respiratory Syncytial Virus B Positive (A) Negative Final   Influenza A Negative Negative Final   Influenza B Negative Negative Final   Parainfluenza 1 Negative Negative Final   Parainfluenza 2 Negative Negative Final   Parainfluenza 3 Negative Negative Final   Metapneumovirus Negative Negative Final   Rhinovirus Negative Negative Final   Adenovirus Negative Negative Final    Comment: (NOTE) Performed At: [facility] [address] [place], [place] [id number] Lindon Romp MD UY:[id number]   Culture, sputum-assessment     Status: None   Collection Time: 08/09/14 12:22 PM  Result Value Ref Range Status    Specimen Description SPUTUM  Final   Special Requests NONE  Final   Sputum evaluation   Final    THIS SPECIMEN IS ACCEPTABLE. RESPIRATORY CULTURE REPORT TO FOLLOW.   Report Status 08/09/2014 FINAL  Final  Culture, respiratory (NON-Expectorated)     Status: None   Collection Time: 08/09/14 12:22 PM  Result Value Ref Range Status   Specimen Description SPUTUM  Final   Special Requests NONE  Final   Gram Stain   Final    ABUNDANT WBC PRESENT, PREDOMINANTLY PMN ABUNDANT SQUAMOUS EPITHELIAL CELLS PRESENT MODERATE GRAM POSITIVE RODS FEW GRAM POSITIVE COCCI IN PAIRS FEW GRAM NEGATIVE RODS    Culture   Final    NORMAL OROPHARYNGEAL FLORA Performed at [facility]    Report Status 08/11/2014 FINAL  Final     Scheduled Meds: . aspirin EC  81 mg Oral QODAY  . atorvastatin  40 mg Oral q1800  . calcium carbonate  1,250 mg Oral Daily  . cholecalciferol  400 Units Oral Daily  . clopidogrel  75 mg Oral Daily  . Difluprednate  1 drop Left Eye Daily  . feeding supplement (ENSURE COMPLETE)  237 mL Oral BID BM  . heparin  5,000 Units Subcutaneous 3 times per day  . imipenem-cilastatin  250 mg Intravenous 4 times per day  . ketorolac  1 drop Left Eye QID  . metoprolol succinate  25 mg Oral Daily   Continuous Infusions:

## 2014-08-12 NOTE — Progress Notes (Signed)
Patient ID: Shannon Rush, female   DOB: [birth year], 74 y.o.   MRN: [id number]         [facility] for Infectious Disease    Date of Admission:  08/08/2014             Day 5 antibiotic therapy                   Day 2 imipenem  Principal Problem:   HCAP (healthcare-associated pneumonia) Active Problems:   Multiple myeloma   History of peripheral stem cell transplant   Anemia, macrocytic   Osteoporosis   History of ST elevation myocardial infarction (STEMI)   Coronary atherosclerosis of native coronary artery   Pancytopenia   . aspirin EC  81 mg Oral QODAY  . atorvastatin  40 mg Oral q1800  . calcium carbonate  1,250 mg Oral Daily  . cholecalciferol  400 Units Oral Daily  . clopidogrel  75 mg Oral Daily  . Difluprednate  1 drop Left Eye Daily  . feeding supplement (ENSURE COMPLETE)  237 mL Oral BID BM  . heparin  5,000 Units Subcutaneous 3 times per day  . imipenem-cilastatin  250 mg Intravenous 4 times per day  . ketorolac  1 drop Left Eye QID  . metoprolol succinate  25 mg Oral Daily    Subjective: She is feeling much better. She slept well last night. She is still itching but feels like her rash is improving. Her cough is decreasing and her sputum is now clear.  Review of Systems: Pertinent items are noted in HPI.  Past Medical History  Diagnosis Date  . Multiple myeloma 07/01/2012  . Unspecified deficiency anemia   . OSTEOPOROSIS 06/11/2010    Multiple compression fractures; and spontaneous fracture of sternum Qualifier: Diagnosis of  By: [name], [name]     . 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 Cord compression 07/13/12    MRI- diffuse myeloma involvement of T-L spine  . History of radiation therapy 07/13/12-07/27/12    spinal cord compression T3-T10,left scapula  . CAD (coronary artery disease)     a. inf STEMI (in [place] >>> [facility]) >>> LHC (8/15- [place]):  pRCA 60%, mid to dist RCA 50% >>> med rx  . Hx of echocardiogram      Echo (03/02/14-done at [facility]):  Normal LVEF without significant regional wall motion abnormalities. Mild     History  Substance Use Topics  . Smoking status: Never Smoker   . Smokeless tobacco: Never Used  . Alcohol Use: 3.5 oz/week    7 drink(s) per week    Family History  Problem Relation Age of Onset  . Glaucoma Mother   . Heart disease Mother   . Heart disease Father   . Cancer Brother     stage IV prostate CA   Allergies  Allergen Reactions  . Zosyn [Piperacillin Sod-Tazobactam So] Rash    Itching, rash and swelling of extremities  . Zithromax [Azithromycin]     Full body rash  . Ciprofloxacin Rash    Throwing up and stomach pain     OBJECTIVE: Blood pressure 119/52, pulse 104, temperature 98 F (36.7 C), temperature source Oral, resp. rate 20, height 5' 7"  (1.702 m), weight 128 lb 12 oz (58.4 kg), SpO2 93 %. General:  she is sitting up on the side of her head. She is smiling and in much better spirits  Skin:  fine red macular rash that blanches over her trunk and extremities. Palmar erythema and swelling of her hands is resolving.  Lungs:  Much clearer today Cor:  regular S1 and S2 with no murmur  Lab Results Lab Results  Component Value Date   WBC 3.1* 08/12/2014   HGB 8.5* 08/12/2014   HCT 25.3* 08/12/2014   MCV 104.1* 08/12/2014   PLT 136* 08/12/2014    Lab Results  Component Value Date   CREATININE 0.72 08/12/2014   BUN 13 08/12/2014   NA 138 08/12/2014   K 3.6 08/12/2014   CL 110 08/12/2014   CO2 22 08/12/2014    Lab Results  Component Value Date   ALT 54* 08/12/2014   AST 51* 08/12/2014   ALKPHOS 113 08/12/2014   BILITOT 0.6 08/12/2014     Microbiology: Recent Results (from the past 240 hour(s))  Urine culture     Status: None   Collection Time: 08/08/14  8:18 PM  Result Value Ref Range Status   Specimen Description URINE, CLEAN CATCH  Final   Special Requests NONE  Final   Colony Count NO  GROWTH Performed at [facility]   Final   Culture NO GROWTH Performed at [facility]   Final   Report Status 08/10/2014 FINAL  Final  Blood culture (routine x 2)     Status: None (Preliminary result)   Collection Time: 08/08/14  9:53 PM  Result Value Ref Range Status   Specimen Description BLOOD LEFT FOREARM  Final   Special Requests BOTTLES DRAWN AEROBIC ONLY 3CC  Final   Culture   Final           BLOOD CULTURE RECEIVED NO GROWTH TO DATE CULTURE WILL BE HELD FOR 5 DAYS BEFORE ISSUING A FINAL NEGATIVE REPORT Performed at [facility]    Report Status PENDING  Incomplete  Blood culture (routine x 2)     Status: None (Preliminary result)   Collection Time: 08/08/14  9:54 PM  Result Value Ref Range Status   Specimen Description BLOOD LEFT ANTECUBITAL  Final   Special Requests BOTTLES DRAWN AEROBIC AND ANAEROBIC 5CC  Final   Culture   Final           BLOOD CULTURE RECEIVED NO GROWTH TO DATE CULTURE WILL BE HELD FOR 5 DAYS BEFORE ISSUING A FINAL NEGATIVE REPORT Performed at [facility]    Report Status PENDING  Incomplete  Respiratory virus panel     Status: Abnormal   Collection Time: 08/09/14 12:20 PM  Result Value Ref Range Status   Respiratory Syncytial Virus A Negative Negative Final   Respiratory Syncytial Virus B Positive (A) Negative Final   Influenza A Negative Negative Final   Influenza B Negative Negative Final   Parainfluenza 1 Negative Negative Final   Parainfluenza 2 Negative Negative Final   Parainfluenza 3 Negative Negative Final   Metapneumovirus Negative Negative Final   Rhinovirus Negative Negative Final   Adenovirus Negative Negative Final    Comment: (NOTE) Performed At: [facility] [address] [place], [place] [id number] Lindon Romp MD QP:[id number]   Culture, sputum-assessment     Status: None   Collection Time: 08/09/14 12:22 PM  Result Value Ref Range Status   Specimen Description SPUTUM  Final    Special Requests NONE  Final   Sputum evaluation   Final    THIS SPECIMEN IS ACCEPTABLE. RESPIRATORY CULTURE REPORT TO FOLLOW.   Report Status 08/09/2014 FINAL  Final  Culture, respiratory (NON-Expectorated)     Status: None   Collection Time: 08/09/14 12:22 PM  Result Value Ref Range Status   Specimen Description SPUTUM  Final   Special Requests NONE  Final   Gram Stain   Final    ABUNDANT WBC PRESENT, PREDOMINANTLY PMN ABUNDANT SQUAMOUS EPITHELIAL CELLS PRESENT MODERATE GRAM POSITIVE RODS FEW GRAM POSITIVE COCCI IN PAIRS FEW GRAM NEGATIVE RODS    Culture   Final    NORMAL OROPHARYNGEAL FLORA Performed at [facility]    Report Status 08/11/2014 FINAL  Final    Assessment: She is improving on empiric therapy for HCAP and her allergic drug reaction is resolving. I would continue imipenem 1-2 more days then stop and discharge her home.  Plan: 1. continue imipenem 1-2 more days 2. Please call my partner, Dr. Talbot Grumbling [phone]), for any infectious disease questions this weekend  Michel Bickers, MD [facility] for [place] [phone] pager   [phone] cell 08/12/2014, 10:12 AM

## 2014-08-13 DIAGNOSIS — D61818 Other pancytopenia: Secondary | ICD-10-CM

## 2014-08-13 LAB — CBC WITH DIFFERENTIAL/PLATELET
BASOS PCT: 1 % (ref 0–1)
Basophils Absolute: 0 10*3/uL (ref 0.0–0.1)
EOS ABS: 0.2 10*3/uL (ref 0.0–0.7)
EOS PCT: 8 % — AB (ref 0–5)
HCT: 26.2 % — ABNORMAL LOW (ref 36.0–46.0)
Hemoglobin: 8.8 g/dL — ABNORMAL LOW (ref 12.0–15.0)
Lymphocytes Relative: 11 % — ABNORMAL LOW (ref 12–46)
Lymphs Abs: 0.3 10*3/uL — ABNORMAL LOW (ref 0.7–4.0)
MCH: 34.9 pg — ABNORMAL HIGH (ref 26.0–34.0)
MCHC: 33.6 g/dL (ref 30.0–36.0)
MCV: 104 fL — AB (ref 78.0–100.0)
MONO ABS: 0.2 10*3/uL (ref 0.1–1.0)
Monocytes Relative: 9 % (ref 3–12)
Neutro Abs: 1.9 10*3/uL (ref 1.7–7.7)
Neutrophils Relative %: 71 % (ref 43–77)
Platelets: 134 10*3/uL — ABNORMAL LOW (ref 150–400)
RBC: 2.52 MIL/uL — ABNORMAL LOW (ref 3.87–5.11)
RDW: 16.3 % — AB (ref 11.5–15.5)
WBC: 2.6 10*3/uL — ABNORMAL LOW (ref 4.0–10.5)

## 2014-08-13 LAB — COMPREHENSIVE METABOLIC PANEL
ALT: 65 U/L — ABNORMAL HIGH (ref 0–35)
AST: 57 U/L — ABNORMAL HIGH (ref 0–37)
Albumin: 2.5 g/dL — ABNORMAL LOW (ref 3.5–5.2)
Alkaline Phosphatase: 168 U/L — ABNORMAL HIGH (ref 39–117)
Anion gap: 5 (ref 5–15)
BILIRUBIN TOTAL: 0.7 mg/dL (ref 0.3–1.2)
BUN: 13 mg/dL (ref 6–23)
CALCIUM: 8 mg/dL — AB (ref 8.4–10.5)
CO2: 24 mmol/L (ref 19–32)
CREATININE: 0.68 mg/dL (ref 0.50–1.10)
Chloride: 110 mmol/L (ref 96–112)
GFR calc Af Amer: >90 mL/min (ref >90)
GFR, EST NON AFRICAN AMERICAN: 85 mL/min — AB (ref >90)
Glucose, Bld: 93 mg/dL (ref 70–99)
Potassium: 3.8 mmol/L (ref 3.5–5.1)
Sodium: 139 mmol/L (ref 135–145)
TOTAL PROTEIN: 5.2 g/dL — AB (ref 6.0–8.3)

## 2014-08-13 MED ORDER — DIPHENHYDRAMINE HCL 25 MG PO CAPS: 25.0000 mg | ORAL_CAPSULE | Freq: Four times a day (QID) | ORAL | Status: DC | PRN

## 2014-08-13 MED ORDER — DIPHENHYDRAMINE-ZINC ACETATE 2-0.1 % EX CREA: TOPICAL_CREAM | Freq: Three times a day (TID) | CUTANEOUS | Status: DC | PRN

## 2014-08-13 MED ORDER — DIPHENHYDRAMINE-ZINC ACETATE 2-0.1 % EX CREA
TOPICAL_CREAM | Freq: Three times a day (TID) | CUTANEOUS | Status: DC | PRN
  Administered 2014-08-13: 05:00:00 via TOPICAL
  Filled 2014-08-13: qty 28

## 2014-08-13 NOTE — Discharge Summary (Addendum)
Physician Discharge Summary  Shannon Rush LKG:[id number] DOB: [birth year] DOA: 08/08/2014  PCP: [name], MD  Admit date: 08/08/2014 Discharge date: 08/13/2014  Time spent: Greater than 30 minutes  Recommendations for Outpatient Follow-up:  1. Dr. Madison Hickman, PCP in 2 days with repeat labs (CBC with differential & CMP). 2. Dr. [name], Oncology: Patient to call on Monday 08/15/14 for follow-up appointment. 3. Recommend follow-up chest x-ray in 4-6 weeks to ensure resolution of pneumonia findings. 4. Follow HIV antibodies sent from hospital.  Discharge Diagnoses:  Principal Problem:   HCAP (healthcare-associated pneumonia) Active Problems:   Anemia, macrocytic   Osteoporosis   Multiple myeloma   History of peripheral stem cell transplant   History of ST elevation myocardial infarction (STEMI)   Coronary atherosclerosis of native coronary artery   Pancytopenia   Discharge Condition: Improved & Stable  Diet recommendation: Heart healthy diet.  Filed Weights   08/09/14 0050  Weight: 58.4 kg (128 lb 12 oz)    History of present illness:  74 y.o. female with multiple myeloma (on chemotherapy), coronary artery disease, history of stem cell transplantation in [facility] Aug 2014, who presented with fever, productive cough of green sputum, shortness of breath and mild chest pain. Work up in the ED demonstrated right lower lobe pneumonia per chest x-ray; WBC 1.7, negative urinalysis.   Hospital Course:   Principal Problem:  Sepsis secondary to RLL possibly Pneumococcal HCAP (healthcare-associated pneumonia). - pt started on broad spectrum ABX Vancomycin and Cefepime due to immunosuppressed status (receiving chemo) - Sputum culture normal OP flora. Urine culture: No growth. Blood cultures 2: Negative to date. RSV panel: Positive for RSV B. Urine Legionella antigen: Negative. Urine streptococcal antigen positive. - Although she was clinically improving, she continued to  spike high fever (102.15F on 1/27 at 5:11 PM) despite 48 hours of IV antibiotics. Requested ID consultation given her underlying immunocompromised state: Antibiotics were switched from Cefepime to Zosyn but patient developed allergy reaction overnight and has been switched to Primaxin. Vancomycin discontinued. - Hold Revlimid. We will request HIV antibodies. - As per ID follow-up/discussed with Dr. Megan Salon on 1/29: 1-2 more days of IV Primaxin, then discharge home. - RSV B + - Patient has defervesced without high fevers since 1/27. Clinically she looks and feels well except for waxing and waning pruritic skin rash. The skin rash may be due to Primaxin. As discussed with Dr. Megan Salon yesterday, will discontinue all antibiotics and discharge home with close outpatient follow-up with her PCP early next week. Patient prefers and is agreeable to this plan.   Multiple myeloma  - s/p stem cell transplantation (autotransplant) in August 2014 at [facility] - has been followed by [facility]. Last seen 02/2014 - followed by Dr. Burr Medico here, last seen was 3 weeks ago, has an appointment on next Thursday. Dr. Burr Medico visited with patient in the hospital on 1/27. Discussed with Dr. Pilar Plate who advised holding Revlimid and suggested ID consultation.  - MM markers demonstrate a very good response and vaccines are up to date - pt is on revlimid 2.5 mg daily for 3 weeks on and then one week off, supposed to restart Revlimid on 08/09/14-Held until outpatient follow-up with oncology.  Active Problems:  Pancytopenia due to chemotherapy - Anemia and thrombocytopenia are stable. WBC count have gradually dropped to 2.6 with ANC of 1.9. She is advised to follow-up with her PCP or oncologist in the next 2-3 days with repeat CBC for follow-up and evaluation.   Coronary atherosclerosis of  native coronary artery - asymptomatic, continue aspirin and plavix   HLD - continue statin. Patient has mildly abnormal LFTs which were  normal on arrival. Unclear etiology. Follow-up CMP as outpatient.    HTN - reasonable inpatient control  - continue Metoprolol   Hypokalemia - Replaced  Skin rash - Possibly related to antibiotics-unclear which one. Waxing and waning. Discontinue Primaxin today and closely follow outpatient. Continue supportive/symptomatic treatment.   Malaria prophylaxis  - patient was supposed to go to [place] on Tuesday of next week. She has been advised to postpone her trip by a couple of weeks to allow adequate recovery from current acute illness. She and husband were prescribed malaria prophylaxis for that trip-patient to discuss with her PCP prior to travel if it is still safe to continue with medication.   Consultations:  Infectious disease   Oncology  Procedures:  None    Discharge Exam:  Complaints:  States that she had increased pruritus last night and may be the skin rash on her legs is a little worse. Denies any other complaints. Anxious to go home.   Filed Vitals:   08/12/14 1343 08/12/14 2225 08/13/14 0515 08/13/14 1437  BP: 98/64 122/65 117/69 133/84  Pulse: 85 91 95 99  Temp: 98 F (36.7 C) 98.1 F (36.7 C) 99 F (37.2 C) 98.1 F (36.7 C)  TempSrc: Oral Oral Oral Oral  Resp: _0 Height:      Weight:      SpO2: 95% 94% 92% 92%     General: Pt is alert, follows commands appropriately, not in acute distress  Cardiovascular: Regular rate and rhythm, S1/S2, no rubs, no gallops  Respiratory: Occasional basal crackles but otherwise clear to auscultation. No increased work of breathing.   Extremities: pulses DP and PT palpable bilaterally. Symmetric 5 x 5 power. Chronic arthritic changes of bilateral fingers. Fingers, hand and feet slightly puffy but no acute findings.  Neuro: Grossly nonfocal. Alert and oriented.  Skin: Fine erythematous macular rash that blanches, seen over trunk and lower extremities-seems about the same as it did in the last 2  days.Marland Kitchen  Discharge Instructions  Discharge Instructions    Call MD for:  difficulty breathing, headache or visual disturbances    Complete by:  As directed      Call MD for:  extreme fatigue    Complete by:  As directed      Call MD for:  persistant dizziness or light-headedness    Complete by:  As directed      Call MD for:  redness, tenderness, or signs of infection (pain, swelling, redness, odor or green/yellow discharge around incision site)    Complete by:  As directed      Call MD for:  severe uncontrolled pain    Complete by:  As directed      Call MD for:  temperature >100.4    Complete by:  As directed      Call MD for:    Complete by:  As directed   Worsening skin rash.     Diet - low sodium heart healthy    Complete by:  As directed      Increase activity slowly    Complete by:  As directed             Medication List    STOP taking these medications        lenalidomide 2.5 MG capsule  Commonly known as:  REVLIMID  TAKE these medications        aspirin 81 MG EC tablet  Take 81 mg by mouth every other day.     atorvastatin 40 MG tablet  Commonly known as:  LIPITOR  Take 1 tablet (40 mg total) by mouth daily.     calcium carbonate 600 MG Tabs tablet  Commonly known as:  OS-CAL  Take 600 mg by mouth daily.     clopidogrel 75 MG tablet  Commonly known as:  PLAVIX  Take 1 tablet (75 mg total) by mouth daily.     diphenhydrAMINE 25 mg capsule  Commonly known as:  BENADRYL  Take 1 capsule (25 mg total) by mouth every 6 (six) hours as needed for itching.     diphenhydrAMINE-zinc acetate cream  Commonly known as:  BENADRYL  Apply topically 3 (three) times daily as needed for itching.     DUREZOL 0.05 % Emul  Generic drug:  Difluprednate  Place 1 drop into the left eye daily.     guaiFENesin 100 MG/5ML liquid  Commonly known as:  ROBITUSSIN  Take 200 mg by mouth 3 (three) times daily as needed for cough (cough).     mefloquine 250 MG tablet   Commonly known as:  LARIAM  Take 1 tablet (250 mg total) by mouth every 7 (seven) days. Self and husband start now, continue for 4 weeks after return     metoprolol succinate 25 MG 24 hr tablet  Commonly known as:  TOPROL-XL  Take 1 tablet (25 mg total) by mouth daily.     morphine 15 MG tablet  Commonly known as:  MSIR  Take 1 tablet (15 mg total) by mouth every 6 (six) hours as needed for severe pain.     ondansetron 4 MG tablet  Commonly known as:  ZOFRAN  Take 4 mg by mouth every 8 (eight) hours as needed for nausea or vomiting.     PROLENSA 0.07 % Soln  Generic drug:  Bromfenac Sodium  Place 1 drop into the left eye 2 (two) times daily.     vitamin D (CHOLECALCIFEROL) 400 UNITS tablet  Take 400 Units by mouth daily.           Follow-up Information    Follow up with [name], MD. Schedule an appointment as soon as possible for a visit in 3 days.   Specialty:  Family Medicine   Why:  To be seen with repeat labs (CBC with differential & CMP).   Contact information:   [place] [address] [phone]       Follow up with [name], MD.   Specialties:  Hematology, Oncology   Why:  Call on 08/15/14 for follow-up appointment.   Contact information:   [place] [address] [phone]        The results of significant diagnostics from this hospitalization (including imaging, microbiology, ancillary and laboratory) are listed below for reference.    Significant Diagnostic Studies: Dg Chest 2 View  08/08/2014   CLINICAL DATA:  Fever. Nausea. Aches and pains. Onset of symptoms at 3 p.m. today.  EXAM: CHEST  2 VIEW  COMPARISON:  Multiple priors dating back to 2014.  FINDINGS: Focal consolidation is present at the RIGHT lung base, overlying the RIGHT breast shadow. This likely represents a focus of pneumonia or aspiration pneumonitis. Thoracic kyphosis is present with multiple chronic thoracic spine compression fractures.  Cardiopericardial silhouette appears within normal limits. Bilateral pleural apical  scarring. Mediastinal contours are also within normal limits. Chronic deformity of the sternum compatible with an old fracture.  IMPRESSION: Dense RIGHT lower lobe consolidation, most compatible with pneumonia. Followup in 4-6 weeks to ensure radiographic clearing and exclude an underlying lesion is recommended.   Electronically Signed   By: Dereck Ligas M.D.   On: 08/08/2014 21:40    Microbiology: Recent Results (from the past 240 hour(s))  Urine culture     Status: None   Collection Time: 08/08/14  8:18 PM  Result Value Ref Range Status   Specimen Description URINE, CLEAN CATCH  Final   Special Requests NONE  Final   Colony Count NO GROWTH Performed at [facility]   Final   Culture NO GROWTH Performed at [facility]   Final   Report Status 08/10/2014 FINAL  Final  Blood culture (routine x 2)     Status: None (Preliminary result)   Collection Time: 08/08/14  9:53 PM  Result Value Ref Range Status   Specimen Description BLOOD LEFT FOREARM  Final   Special Requests BOTTLES DRAWN AEROBIC ONLY 3CC  Final   Culture   Final           BLOOD CULTURE RECEIVED NO GROWTH TO DATE CULTURE WILL BE HELD FOR 5 DAYS BEFORE ISSUING A FINAL NEGATIVE REPORT Performed at [facility]    Report Status PENDING  Incomplete  Blood culture (routine x 2)     Status: None (Preliminary result)   Collection Time: 08/08/14  9:54 PM  Result Value Ref Range Status   Specimen Description BLOOD LEFT ANTECUBITAL  Final   Special Requests BOTTLES DRAWN AEROBIC AND ANAEROBIC 5CC  Final   Culture   Final           BLOOD CULTURE RECEIVED NO GROWTH TO DATE CULTURE WILL BE HELD FOR 5 DAYS BEFORE ISSUING A FINAL NEGATIVE REPORT Performed at [facility]    Report Status PENDING  Incomplete  Respiratory virus panel     Status: Abnormal   Collection Time: 08/09/14 12:20 PM  Result Value Ref Range  Status   Respiratory Syncytial Virus A Negative Negative Final   Respiratory Syncytial Virus B Positive (A) Negative Final   Influenza A Negative Negative Final   Influenza B Negative Negative Final   Parainfluenza 1 Negative Negative Final   Parainfluenza 2 Negative Negative Final   Parainfluenza 3 Negative Negative Final   Metapneumovirus Negative Negative Final   Rhinovirus Negative Negative Final   Adenovirus Negative Negative Final    Comment: (NOTE) Performed At: [facility] [address] [place], [place] [id number] Lindon Romp MD PO:[id number]   Culture, sputum-assessment     Status: None   Collection Time: 08/09/14 12:22 PM  Result Value Ref Range Status   Specimen Description SPUTUM  Final   Special Requests NONE  Final   Sputum evaluation   Final    THIS SPECIMEN IS ACCEPTABLE. RESPIRATORY CULTURE REPORT TO FOLLOW.   Report Status 08/09/2014 FINAL  Final  Culture, respiratory (NON-Expectorated)     Status: None   Collection Time: 08/09/14 12:22 PM  Result Value Ref Range Status   Specimen Description SPUTUM  Final   Special Requests NONE  Final   Gram Stain   Final    ABUNDANT WBC PRESENT, PREDOMINANTLY PMN ABUNDANT SQUAMOUS EPITHELIAL CELLS PRESENT MODERATE GRAM POSITIVE RODS FEW GRAM POSITIVE COCCI IN PAIRS FEW GRAM NEGATIVE RODS    Culture   Final  NORMAL OROPHARYNGEAL FLORA Performed at [facility]    Report Status 08/11/2014 FINAL  Final     Labs: Basic Metabolic Panel:  Recent Labs Lab 08/08/14 2025 08/10/14 0455 08/11/14 0533 08/12/14 0555 08/13/14 0524  NA 140 134* 138 138 139  K 3.8 3.3* 3.6 3.6 3.8  CL 106 108 110 110 110  CO2 _0 GLUCOSE 119* 105* 115* 137* 93  BUN _1 CREATININE 0.78 0.80 0.89 0.72 0.68  CALCIUM 9.7 7.8* 7.9* 8.1* 8.0*  MG  --   --  1.7  --   --    Liver Function Tests:  Recent Labs Lab 08/08/14 2025 08/12/14 0555 08/13/14 0524  AST 36 51* 57*  ALT 27 54* 65*   ALKPHOS 72 113 168*  BILITOT 0.9 0.6 0.7  PROT 7.4 5.3* 5.2*  ALBUMIN 4.3 2.5* 2.5*   No results for input(s): LIPASE, AMYLASE in the last 168 hours. No results for input(s): AMMONIA in the last 168 hours. CBC:  Recent Labs Lab 08/08/14 2025 08/10/14 0455 08/11/14 0533 08/12/14 0555 08/13/14 0524  WBC 1.7* 2.8* 4.1 3.1* 2.6*  NEUTROABS 1.0* 2.4 3.5 2.6 1.9  HGB 11.3* 9.4* 8.4* 8.5* 8.8*  HCT 33.6* 27.9* 25.4* 25.3* 26.2*  MCV 104.3* 104.1* 104.5* 104.1* 104.0*  PLT 162 132* 135* 136* 134*   Cardiac Enzymes: No results for input(s): CKTOTAL, CKMB, CKMBINDEX, TROPONINI in the last 168 hours. BNP: BNP (last 3 results) No results for input(s): PROBNP in the last 8760 hours. CBG: No results for input(s): GLUCAP in the last 168 hours.      Signed:  Vernell Leep, MD, FACP, FHM. [facility] Pager [phone]  If 7PM-7AM, please contact night-coverage [email or web address] Password [email or web address] 08/13/2014, 3:40 PM

## 2014-08-13 NOTE — Discharge Instructions (Signed)

## 2014-08-15 ENCOUNTER — Ambulatory Visit: Payer: 59 | Admitting: Family Medicine

## 2014-08-15 LAB — CULTURE, BLOOD (ROUTINE X 2)
CULTURE: NO GROWTH
Culture: NO GROWTH

## 2014-08-15 LAB — HIV ANTIBODY (ROUTINE TESTING W REFLEX): HIV Screen 4th Generation wRfx: NONREACTIVE

## 2014-08-17 ENCOUNTER — Encounter: Payer: Self-pay | Admitting: Family Medicine

## 2014-08-17 ENCOUNTER — Other Ambulatory Visit: Payer: Self-pay | Admitting: Hematology

## 2014-08-17 ENCOUNTER — Ambulatory Visit (INDEPENDENT_AMBULATORY_CARE_PROVIDER_SITE_OTHER): Payer: 59 | Admitting: Family Medicine

## 2014-08-17 VITALS — BP 123/82 | HR 77 | Temp 97.8°F | Ht 67.0 in | Wt 129.0 lb

## 2014-08-17 DIAGNOSIS — J189 Pneumonia, unspecified organism: Secondary | ICD-10-CM | POA: Diagnosis not present

## 2014-08-17 DIAGNOSIS — R600 Localized edema: Secondary | ICD-10-CM | POA: Diagnosis not present

## 2014-08-17 LAB — CBC WITH DIFFERENTIAL/PLATELET
BASOS ABS: 0 10*3/uL (ref 0.0–0.1)
Basophils Relative: 1 % (ref 0–1)
EOS ABS: 0.2 10*3/uL (ref 0.0–0.7)
EOS PCT: 5 % (ref 0–5)
HCT: 27.7 % — ABNORMAL LOW (ref 36.0–46.0)
Hemoglobin: 9.1 g/dL — ABNORMAL LOW (ref 12.0–15.0)
LYMPHS PCT: 34 % (ref 12–46)
Lymphs Abs: 1.4 10*3/uL (ref 0.7–4.0)
MCH: 33.8 pg (ref 26.0–34.0)
MCHC: 32.9 g/dL (ref 30.0–36.0)
MCV: 103 fL — AB (ref 78.0–100.0)
MPV: 9.2 fL (ref 8.6–12.4)
Monocytes Absolute: 0.4 10*3/uL (ref 0.1–1.0)
Monocytes Relative: 9 % (ref 3–12)
Neutro Abs: 2.1 10*3/uL (ref 1.7–7.7)
Neutrophils Relative %: 51 % (ref 43–77)
PLATELETS: 137 10*3/uL — AB (ref 150–400)
RBC: 2.69 MIL/uL — ABNORMAL LOW (ref 3.87–5.11)
RDW: 16.4 % — ABNORMAL HIGH (ref 11.5–15.5)
WBC: 4.1 10*3/uL (ref 4.0–10.5)

## 2014-08-17 MED ORDER — FUROSEMIDE 20 MG PO TABS: 20.0000 mg | ORAL_TABLET | Freq: Every day | ORAL | Status: DC

## 2014-08-17 NOTE — Progress Notes (Signed)
   Subjective:    Patient ID: Shannon Rush, female    DOB: [birth year], 74 y.o.   MRN: [id number]  HPI FU hospitalization and neutropenia.  Doing well since DC.  SLOWLY improving.  Still with cough.  Appetite marginal Sig leg swelling - which she has had before.    Review of Systems     Objective:   Physical ExamLungs Rt basilar crackles 2-3+ edema bilaterally.        Assessment & Plan:

## 2014-08-17 NOTE — Assessment & Plan Note (Signed)
Short term dose lasix.

## 2014-08-17 NOTE — Patient Instructions (Signed)
You seem to be recovering nicely. I will call on Monday with blood test results. I sent in a four day prescription for lasix. OK to get back to exercising SLOWLY Please get some extra calories in you over the next 4 weeks.  You are acutely malnourished.

## 2014-08-17 NOTE — Assessment & Plan Note (Signed)
Doing well.  Repeat CXR in 5 weeks

## 2014-08-18 ENCOUNTER — Telehealth: Payer: Self-pay | Admitting: Hematology

## 2014-08-18 NOTE — Telephone Encounter (Signed)
Patient confirmed appointment for 02/11.

## 2014-08-25 ENCOUNTER — Ambulatory Visit (HOSPITAL_BASED_OUTPATIENT_CLINIC_OR_DEPARTMENT_OTHER): Payer: 59 | Admitting: Hematology

## 2014-08-25 ENCOUNTER — Other Ambulatory Visit (HOSPITAL_BASED_OUTPATIENT_CLINIC_OR_DEPARTMENT_OTHER): Payer: 59

## 2014-08-25 ENCOUNTER — Ambulatory Visit (HOSPITAL_COMMUNITY)
Admission: RE | Admit: 2014-08-25 | Discharge: 2014-08-25 | Disposition: A | Discharge status: Home / Self Care | Payer: Medicare Other | Source: Ambulatory Visit | Attending: Hematology | Admitting: Hematology

## 2014-08-25 ENCOUNTER — Telehealth: Payer: Self-pay | Admitting: Hematology

## 2014-08-25 VITALS — BP 128/72 | HR 74 | Temp 97.7°F | Resp 18 | Ht 67.0 in | Wt 128.3 lb

## 2014-08-25 DIAGNOSIS — D61818 Other pancytopenia: Secondary | ICD-10-CM | POA: Insufficient documentation

## 2014-08-25 DIAGNOSIS — M810 Age-related osteoporosis without current pathological fracture: Secondary | ICD-10-CM

## 2014-08-25 DIAGNOSIS — C9000 Multiple myeloma not having achieved remission: Secondary | ICD-10-CM | POA: Diagnosis not present

## 2014-08-25 DIAGNOSIS — D61810 Antineoplastic chemotherapy induced pancytopenia: Secondary | ICD-10-CM

## 2014-08-25 DIAGNOSIS — R600 Localized edema: Secondary | ICD-10-CM | POA: Diagnosis not present

## 2014-08-25 LAB — CBC WITH DIFFERENTIAL/PLATELET
BASO%: 2 % (ref 0.0–2.0)
BASOS ABS: 0.1 10*3/uL (ref 0.0–0.1)
EOS ABS: 0.3 10*3/uL (ref 0.0–0.5)
EOS%: 6.7 % (ref 0.0–7.0)
HEMATOCRIT: 31.7 % — AB (ref 34.8–46.6)
HEMOGLOBIN: 10.2 g/dL — AB (ref 11.6–15.9)
LYMPH%: 19.2 % (ref 14.0–49.7)
MCH: 33.9 pg (ref 25.1–34.0)
MCHC: 32.3 g/dL (ref 31.5–36.0)
MCV: 105.2 fL — AB (ref 79.5–101.0)
MONO#: 0.4 10*3/uL (ref 0.1–0.9)
MONO%: 10 % (ref 0.0–14.0)
NEUT%: 62.1 % (ref 38.4–76.8)
NEUTROS ABS: 2.6 10*3/uL (ref 1.5–6.5)
PLATELETS: 208 10*3/uL (ref 145–400)
RBC: 3.02 10*6/uL — ABNORMAL LOW (ref 3.70–5.45)
RDW: 17.2 % — ABNORMAL HIGH (ref 11.2–14.5)
WBC: 4.1 10*3/uL (ref 3.9–10.3)
lymph#: 0.8 10*3/uL — ABNORMAL LOW (ref 0.9–3.3)

## 2014-08-25 LAB — COMPREHENSIVE METABOLIC PANEL (CC13)
ALT: 19 U/L (ref 0–55)
ANION GAP: 9 meq/L (ref 3–11)
AST: 26 U/L (ref 5–34)
Albumin: 3.7 g/dL (ref 3.5–5.0)
Alkaline Phosphatase: 121 U/L (ref 40–150)
BILIRUBIN TOTAL: 0.59 mg/dL (ref 0.20–1.20)
BUN: 16.9 mg/dL (ref 7.0–26.0)
CALCIUM: 9.1 mg/dL (ref 8.4–10.4)
CHLORIDE: 105 meq/L (ref 98–109)
CO2: 26 mEq/L (ref 22–29)
Creatinine: 0.8 mg/dL (ref 0.6–1.1)
EGFR: 79 mL/min/{1.73_m2} — AB (ref >90)
Glucose: 111 mg/dl (ref 70–140)
Potassium: 4.2 mEq/L (ref 3.5–5.1)
Sodium: 140 mEq/L (ref 136–145)
Total Protein: 7.3 g/dL (ref 6.4–8.3)

## 2014-08-25 NOTE — Progress Notes (Signed)
Bilateral lower extremity venous duplex completed:  No evidence of DVT, superficial thrombosis, or Baker's cyst.   

## 2014-08-25 NOTE — Progress Notes (Signed)
[place]. Regarding a patient more (or I would like to you regarding she call back with use of 9 15 and his vision is without a [place] NOTE DATE OF VISIT: 05/12/2014  Shannon Gottron, MD [place] [address]  DIAGNOSIS: Multiple myeloma - Plan: Lower Extremity Venous Duplex Bilateral  CHIEF COMPLAINS: Follow-up of multiple myeloma  CURRENT TREATMENT:  Zometa 3.5 mg monthly started on 06/2013;  Revlimid 5 mg daily for 21 days then off for 7 days for maintenance for at least 2 years started on 09/02/2013.  Decreased to Revlimid 2.5 mg daily for 21 days then off for 7 days on 12/30/2013.     Multiple myeloma   05/26/2012 Initial Diagnosis Presenting IgG was 4,040 mg/dL on 06/05/2012 (IgA 35; IgM 34); kappa free light chain 34.4 mg/dL, lambda 0.00, kappa:lambda ratio of 34.75; SPEP with M-spike of 2.60.   06/30/2012 Imaging Numerous lytic lesions throughout the calvarium.  Lytic lesion within the left lateral scapula.  Findings most compatible with metastases or myeloma. Multiple mid and lower thoracic compression fractures.  Slight compression through the endplates at L3.   37/10/8887 Bone Marrow Biopsy Bone marrow biopsy showed 49% plasma cell. Normal classical cytogenetics; however, myeloma FISH panel showed 13q- (intermediate risk)       07/14/2012 - 07/27/2012 Radiation Therapy Palliative radiation 20 Gy over 10 fractions between to thoracic spine cord compression and symptomatic left scapula lytic lesion.   08/10/2012 -  Chemotherapy Started SQ Velcade once weekly, 3 weeks on, 1 weeks off; daily Revlimid d1-21, 7 days off; and Dexamethasone 50m PO weekly.    02/12/2013 [facility] Mar[facility]    06/14/2013 Tumor Marker IgG  1830,  Kappa:lamba ratio, 1.69 (baseline was 34.75 or   M-spike 0.28 (baseline of 2.6 or  89.3% of baseline).     06/25/2013 -  Chemotherapy  Started zometa 3.5 mg monthly    09/01/2013 -  Chemotherapy Maintenance therapy with revlimid 514mdaily for 14 days and then off for 7 (decreased from 21 days on and 7 days off based on neutropenia).    12/20/2013 Treatment Plan Change Maintenance therapy decreased to 2.5 mg daily for 21 days and then off for 7 days based on low counts.    01/25/2014 Tumor Marker IgG 1360 M spike 0.5   03/01/2014 - 03/05/2014 [facility], received thrombolytic therapy, cath showed 60% prox RCA, mid-distal RCA 50%, Echo normal, no stents.   04/11/2014 -  Chemotherapy Continue Revlimid at 2.5 mg 3 weeks on 1 week off   INTERVAL HISTORY:  [name]89.o. female with a past medical history of MM as detailed above, s/p SCT is here for follow-up. She remains on revlimid 2.5 mg for 21 days on and 7 days off.    She was admitted for health care associated pneumonia on 08/08/2014. She was treated with broad antibiotics, and was discharged home on 08/13/2014. She has recovered well from the recent infection, no residual cough or dyspnea on exertion. She still has some residual fatigue, able to do her most routine daily activities. No more fever or chills. Her Revlimid was held during her hospitalization and she has not restarted yet.  MEDICAL HISTORY: Past Medical History  Diagnosis Date  . Multiple myeloma 07/01/2012  . Unspecified deficiency anemia   . OSTEOPOROSIS 06/11/2010  Multiple compression fractures; and spontaneous fracture of sternum Qualifier: Diagnosis of  By: [name], [name]     . 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 Cord compression 07/13/12    MRI- diffuse myeloma involvement of T-L spine  . History of radiation therapy 07/13/12-07/27/12    spinal cord compression T3-T10,left scapula  . CAD (coronary artery disease)     a. inf STEMI (in [place] >>> lytics) >>> LHC (8/15- [place]):  pRCA 60%, mid to  dist RCA 50% >>> med rx  . Hx of echocardiogram     Echo (03/02/14-done at [facility]):  Normal LVEF without significant regional wall motion abnormalities. Mild      ALLERGIES:  is allergic to zosyn; zithromax; and ciprofloxacin.  MEDICATIONS: has a current medication list which includes the following prescription(s): aspirin, atorvastatin, calcium carbonate, clopidogrel, metoprolol succinate, ondansetron, vitamin d (cholecalciferol), and morphine.  SURGICAL HISTORY:  Past Surgical History  Procedure Laterality Date  . Appendectomy    . Tubal ligation    . Cesarean section      x2   . Hip surgery  2009    left  . Elbow surgery    . Colonoscopy  2007    neg with Dr. [name]    REVIEW OF SYSTEMS:   Constitutional: Denies abnormal weight loss Eyes: Denies blurriness of vision Ears, nose, mouth, throat, and face: Denies mucositis or sore throat, (+) left tooth pain  Respiratory: Denies cough, dyspnea or wheezes Cardiovascular: Denies palpitation, chest discomfort or lower extremity swelling Gastrointestinal:  Denies nausea, heartburn or change in bowel habits Skin: Denies abnormal skin rashes Lymphatics: Denies new lymphadenopathy or easy bruising Neurological:Denies numbness, tingling or new weaknesses Behavioral/Psych: Mood is stable, no new changes  All other systems were reviewed with the patient and are negative.  PHYSICAL EXAMINATION: ECOG PERFORMANCE STATUS: 0  Blood pressure 128/72, pulse 74, temperature 97.7 F (36.5 C), temperature source Oral, resp. rate 18, height 5' 7"  (1.702 m), weight 128 lb 4.8 oz (58.196 kg), SpO2 95 %.  GENERAL:alert, no distress and comfortable; easily mobile to exam table; kyphosis, moderate.  SKIN: skin color, texture, turgor are normal, no rashes or significant lesions; Abrasion well healed on RLE.  EYES: normal, Conjunctiva are pink, 1 tear/injectionof blood vessel left eye in sclera. OROPHARYNX:no exudate, no  erythema and lips, buccal mucosa, and tongue normal, no ONJ NECK: supple, thyroid normal size, non-tender, without nodularity; nodules cervical bilaterally.  LYMPH:  no palpable lymphadenopathy in the cervical, axillary or supraclavicular LUNGS: crackles at the bases bilaterally with normal breathing effort, no wheezes or rhonchi HEART: regular rate & rhythm and no murmurs and no lower extremity edema ABDOMEN:abdomen soft, non-tender and normal bowel sounds Musculoskeletal:no cyanosis of digits and no clubbing  NEURO: alert & oriented x 3 with fluent speech, no focal motor/sensory deficits  Labs:  CBC Latest Ref Rng 08/25/2014 08/17/2014 08/13/2014  WBC 3.9 - 10.3 10e3/uL 4.1 4.1 2.6(L)  Hemoglobin 11.6 - 15.9 g/dL 10.2(L) 9.1(L) 8.8(L)  Hematocrit 34.8 - 46.6 % 31.7(L) 27.7(L) 26.2(L)  Platelets 145 - 400 10e3/uL 208 137(L) 134(L)    CMP Latest Ref Rng 08/25/2014 08/13/2014 08/12/2014  Glucose 70 - 140 mg/dl 111 93 137(H)  BUN 7.0 - 26.0 mg/dL 16.9 13 13   Creatinine 0.6 - 1.1 mg/dL 0.8 0.68 0.72  Sodium 136 - 145 mEq/L 140 139 138  Potassium 3.5 - 5.1 mEq/L 4.2 3.8 3.6  Chloride 96 -  112 mmol/L - 110 110  CO2 22 - 29 mEq/L 26 24 22   Calcium 8.4 - 10.4 mg/dL 9.1 8.0(L) 8.1(L)  Total Protein 6.4 - 8.3 g/dL 7.3 5.2(L) 5.3(L)  Total Bilirubin 0.20 - 1.20 mg/dL 0.59 0.7 0.6  Alkaline Phos 40 - 150 U/L 121 168(H) 113  AST 5 - 34 U/L 26 57(H) 51(H)  ALT 0 - 55 U/L 19 65(H) 54(H)    RADIOGRAPHIC STUDIES: None  ASSESSMENT: Shannon Rush 74 y.o. female with a history of Multiple Myeloma s/p autotransplant in August 2014 now on maintenence low dose Revlimid therapy  PLAN:   1. IgG kappa Multiple myeloma s/p Auto SCT, intermediate risk.  --MM markers demonstrate a very good response (M protein baseline 2.6, down to 0.28 after induction chemo and transplant, stable around 0.5 since July 2015,  Most recent 0.45)  - Acyclovir has been stopped after 1 year of SCT.   -- She started revlimid 5 mg  daily for maintenance on 09/02/2013.   Decreased to 2.5 daily for 3 weeks on and then one week off on 12/20/2013.  -Revlimid was recently held due to her pneumonia. She has recovered well, feels well overall. I'll restart her Revlimid next week. I discussed that she is at increased risk for infection when she is on Revlimid. However she's has residual disease, multiple myeloma is likely going to look progress of treatment. She agrees to restart Revlimid. -- Zometa will be held until she complete her dental work. - She is also following closely with [facility] transplant team in March. --vaccine are uptodate  2. Left lower premolar tooth infection -December resolved after the course of antibiotics. Her dentist recommends tooth extraction next month.  -I'll hold Zometa today and the next months.   3. Pancytopenia due to chemotherapy. --   Her blood counts improved after she is off Revlimid.  3. Osteoporosis -recently repeated bone density scan showed worsening osteoporosis, will continue zometa monthly for additional 6 month then change to every 3 months, but will hold temporary for her dental procedure. She was seen by her dentist last week, and is can have this extraction in March.   Plan -Restart Revlimid next Monday. -RTC in one month with labs  -Hold Zometa today and the next months.  All questions were answered. The patient knows to call the clinic with any problems, questions or concerns. We can certainly see the patient much sooner if necessary.  I spent 20 minutes counseling the patient face to face. The total time spent in the appointment was 25 minutes.   [name]  08/27/2014

## 2014-08-25 NOTE — Telephone Encounter (Signed)
Left message to confirm appointment for March. °

## 2014-08-27 ENCOUNTER — Encounter: Payer: Self-pay | Admitting: Hematology

## 2014-09-12 ENCOUNTER — Telehealth: Payer: Self-pay | Admitting: Hematology

## 2014-09-12 NOTE — Telephone Encounter (Signed)
Faxed pt office note to [facility]

## 2014-09-14 ENCOUNTER — Inpatient Hospital Stay (HOSPITAL_COMMUNITY)
Admission: EM | Admit: 2014-09-14 | Discharge: 2014-09-19 | DRG: 871 | Disposition: A | Discharge status: Home / Self Care | Payer: Medicare Other | Attending: Internal Medicine | Admitting: Internal Medicine

## 2014-09-14 ENCOUNTER — Other Ambulatory Visit: Payer: Self-pay | Admitting: Hematology

## 2014-09-14 ENCOUNTER — Telehealth: Payer: Self-pay | Admitting: *Deleted

## 2014-09-14 ENCOUNTER — Encounter (HOSPITAL_COMMUNITY): Payer: Self-pay

## 2014-09-14 ENCOUNTER — Telehealth: Payer: Self-pay | Admitting: Hematology

## 2014-09-14 ENCOUNTER — Emergency Department (HOSPITAL_COMMUNITY): Payer: Medicare Other

## 2014-09-14 DIAGNOSIS — E872 Acidosis: Secondary | ICD-10-CM | POA: Diagnosis present

## 2014-09-14 DIAGNOSIS — J9601 Acute respiratory failure with hypoxia: Secondary | ICD-10-CM | POA: Diagnosis not present

## 2014-09-14 DIAGNOSIS — D709 Neutropenia, unspecified: Secondary | ICD-10-CM

## 2014-09-14 DIAGNOSIS — D61810 Antineoplastic chemotherapy induced pancytopenia: Secondary | ICD-10-CM | POA: Diagnosis present

## 2014-09-14 DIAGNOSIS — E876 Hypokalemia: Secondary | ICD-10-CM | POA: Diagnosis present

## 2014-09-14 DIAGNOSIS — Z9481 Bone marrow transplant status: Secondary | ICD-10-CM

## 2014-09-14 DIAGNOSIS — J90 Pleural effusion, not elsewhere classified: Secondary | ICD-10-CM | POA: Diagnosis not present

## 2014-09-14 DIAGNOSIS — I252 Old myocardial infarction: Secondary | ICD-10-CM

## 2014-09-14 DIAGNOSIS — D61818 Other pancytopenia: Secondary | ICD-10-CM | POA: Diagnosis present

## 2014-09-14 DIAGNOSIS — A419 Sepsis, unspecified organism: Secondary | ICD-10-CM | POA: Diagnosis not present

## 2014-09-14 DIAGNOSIS — Z79899 Other long term (current) drug therapy: Secondary | ICD-10-CM | POA: Diagnosis not present

## 2014-09-14 DIAGNOSIS — J189 Pneumonia, unspecified organism: Secondary | ICD-10-CM | POA: Diagnosis present

## 2014-09-14 DIAGNOSIS — E8770 Fluid overload, unspecified: Secondary | ICD-10-CM | POA: Diagnosis present

## 2014-09-14 DIAGNOSIS — R6520 Severe sepsis without septic shock: Secondary | ICD-10-CM | POA: Diagnosis not present

## 2014-09-14 DIAGNOSIS — R0602 Shortness of breath: Secondary | ICD-10-CM | POA: Diagnosis not present

## 2014-09-14 DIAGNOSIS — Z9484 Stem cells transplant status: Secondary | ICD-10-CM | POA: Diagnosis not present

## 2014-09-14 DIAGNOSIS — D539 Nutritional anemia, unspecified: Secondary | ICD-10-CM | POA: Diagnosis present

## 2014-09-14 DIAGNOSIS — C9000 Multiple myeloma not having achieved remission: Secondary | ICD-10-CM | POA: Diagnosis present

## 2014-09-14 DIAGNOSIS — R5081 Fever presenting with conditions classified elsewhere: Secondary | ICD-10-CM | POA: Diagnosis present

## 2014-09-14 DIAGNOSIS — Z8579 Personal history of other malignant neoplasms of lymphoid, hematopoietic and related tissues: Secondary | ICD-10-CM | POA: Diagnosis not present

## 2014-09-14 DIAGNOSIS — IMO0001 Reserved for inherently not codable concepts without codable children: Secondary | ICD-10-CM | POA: Insufficient documentation

## 2014-09-14 DIAGNOSIS — D630 Anemia in neoplastic disease: Secondary | ICD-10-CM | POA: Diagnosis not present

## 2014-09-14 DIAGNOSIS — Y95 Nosocomial condition: Secondary | ICD-10-CM | POA: Diagnosis present

## 2014-09-14 DIAGNOSIS — Z923 Personal history of irradiation: Secondary | ICD-10-CM

## 2014-09-14 DIAGNOSIS — I2510 Atherosclerotic heart disease of native coronary artery without angina pectoris: Secondary | ICD-10-CM | POA: Diagnosis not present

## 2014-09-14 DIAGNOSIS — M810 Age-related osteoporosis without current pathological fracture: Secondary | ICD-10-CM | POA: Diagnosis not present

## 2014-09-14 DIAGNOSIS — T451X5A Adverse effect of antineoplastic and immunosuppressive drugs, initial encounter: Secondary | ICD-10-CM | POA: Diagnosis not present

## 2014-09-14 DIAGNOSIS — R05 Cough: Secondary | ICD-10-CM | POA: Diagnosis not present

## 2014-09-14 DIAGNOSIS — R509 Fever, unspecified: Secondary | ICD-10-CM | POA: Diagnosis not present

## 2014-09-14 LAB — URINALYSIS, ROUTINE W REFLEX MICROSCOPIC
Bilirubin Urine: NEGATIVE
GLUCOSE, UA: NEGATIVE mg/dL
HGB URINE DIPSTICK: NEGATIVE
Ketones, ur: NEGATIVE mg/dL
LEUKOCYTES UA: NEGATIVE
Nitrite: NEGATIVE
PH: 5 (ref 5.0–8.0)
PROTEIN: NEGATIVE mg/dL
Specific Gravity, Urine: 1.017 (ref 1.005–1.030)
Urobilinogen, UA: 0.2 mg/dL (ref 0.0–1.0)

## 2014-09-14 LAB — CBC WITH DIFFERENTIAL/PLATELET
Basophils Absolute: 0 10*3/uL (ref 0.0–0.1)
Basophils Relative: 0 % (ref 0–1)
EOS ABS: 0 10*3/uL (ref 0.0–0.7)
Eosinophils Relative: 2 % (ref 0–5)
HCT: 29.4 % — ABNORMAL LOW (ref 36.0–46.0)
Hemoglobin: 9.8 g/dL — ABNORMAL LOW (ref 12.0–15.0)
LYMPHS PCT: 9 % — AB (ref 12–46)
Lymphs Abs: 0.1 10*3/uL — ABNORMAL LOW (ref 0.7–4.0)
MCH: 34.8 pg — ABNORMAL HIGH (ref 26.0–34.0)
MCHC: 33.3 g/dL (ref 30.0–36.0)
MCV: 104.3 fL — AB (ref 78.0–100.0)
Monocytes Absolute: 0 10*3/uL — ABNORMAL LOW (ref 0.1–1.0)
Monocytes Relative: 2 % — ABNORMAL LOW (ref 3–12)
NEUTROS PCT: 87 % — AB (ref 43–77)
Neutro Abs: 0.5 10*3/uL — ABNORMAL LOW (ref 1.7–7.7)
Platelets: 191 10*3/uL (ref 150–400)
RBC: 2.82 MIL/uL — ABNORMAL LOW (ref 3.87–5.11)
RDW: 17.3 % — ABNORMAL HIGH (ref 11.5–15.5)
WBC: 0.6 10*3/uL — CL (ref 4.0–10.5)

## 2014-09-14 LAB — COMPREHENSIVE METABOLIC PANEL
ALT: 71 U/L — AB (ref 0–35)
AST: 147 U/L — ABNORMAL HIGH (ref 0–37)
Albumin: 3.3 g/dL — ABNORMAL LOW (ref 3.5–5.2)
Alkaline Phosphatase: 294 U/L — ABNORMAL HIGH (ref 39–117)
Anion gap: 10 (ref 5–15)
BUN: 15 mg/dL (ref 6–23)
CALCIUM: 8.7 mg/dL (ref 8.4–10.5)
CO2: 19 mmol/L (ref 19–32)
Chloride: 103 mmol/L (ref 96–112)
Creatinine, Ser: 0.95 mg/dL (ref 0.50–1.10)
GFR calc Af Amer: 68 mL/min — ABNORMAL LOW (ref >90)
GFR calc non Af Amer: 58 mL/min — ABNORMAL LOW (ref >90)
Glucose, Bld: 177 mg/dL — ABNORMAL HIGH (ref 70–99)
POTASSIUM: 3 mmol/L — AB (ref 3.5–5.1)
Sodium: 132 mmol/L — ABNORMAL LOW (ref 135–145)
TOTAL PROTEIN: 7.5 g/dL (ref 6.0–8.3)
Total Bilirubin: 1.2 mg/dL (ref 0.3–1.2)

## 2014-09-14 LAB — I-STAT CG4 LACTIC ACID, ED: Lactic Acid, Venous: 4.57 mmol/L (ref 0.5–2.0)

## 2014-09-14 MED ORDER — SODIUM CHLORIDE 0.9 % IV SOLN
INTRAVENOUS | Status: DC
  Administered 2014-09-15 (×2): via INTRAVENOUS

## 2014-09-14 MED ORDER — SODIUM CHLORIDE 0.9 % IV SOLN
1000.0000 mL | Freq: Once | INTRAVENOUS | Status: AC
  Administered 2014-09-14: 1000 mL via INTRAVENOUS

## 2014-09-14 MED ORDER — SODIUM CHLORIDE 0.9 % IV BOLUS (SEPSIS)
1000.0000 mL | Freq: Once | INTRAVENOUS | Status: AC
  Administered 2014-09-14: 1000 mL via INTRAVENOUS

## 2014-09-14 MED ORDER — POTASSIUM CHLORIDE 10 MEQ/100ML IV SOLN
10.0000 meq | INTRAVENOUS | Status: AC
  Administered 2014-09-15 (×4): 10 meq via INTRAVENOUS
  Filled 2014-09-14 (×2): qty 100

## 2014-09-14 MED ORDER — DEXTROSE 5 % IV SOLN
2.0000 g | Freq: Three times a day (TID) | INTRAVENOUS | Status: DC
  Administered 2014-09-15 – 2014-09-18 (×11): 2 g via INTRAVENOUS
  Filled 2014-09-14 (×13): qty 2

## 2014-09-14 MED ORDER — VANCOMYCIN HCL 500 MG IV SOLR
500.0000 mg | Freq: Two times a day (BID) | INTRAVENOUS | Status: DC
  Administered 2014-09-15 (×3): 500 mg via INTRAVENOUS
  Filled 2014-09-14 (×4): qty 500

## 2014-09-14 MED ORDER — SODIUM CHLORIDE 0.9 % IV SOLN
1000.0000 mL | INTRAVENOUS | Status: DC
  Administered 2014-09-14: 1000 mL via INTRAVENOUS

## 2014-09-14 MED ORDER — GUAIFENESIN 100 MG/5ML PO SOLN
200.0000 mg | Freq: Three times a day (TID) | ORAL | Status: DC | PRN
  Administered 2014-09-15 – 2014-09-17 (×7): 200 mg via ORAL
  Filled 2014-09-14 (×8): qty 10

## 2014-09-14 MED ORDER — SODIUM CHLORIDE 0.9 % IV BOLUS (SEPSIS): 1000.0000 mL | INTRAVENOUS | Status: DC

## 2014-09-14 MED ORDER — ENOXAPARIN SODIUM 40 MG/0.4ML ~~LOC~~ SOLN
40.0000 mg | Freq: Every day | SUBCUTANEOUS | Status: DC
  Administered 2014-09-15 – 2014-09-18 (×5): 40 mg via SUBCUTANEOUS
  Filled 2014-09-14 (×6): qty 0.4

## 2014-09-14 MED ORDER — ACETAMINOPHEN 325 MG PO TABS
650.0000 mg | ORAL_TABLET | Freq: Once | ORAL | Status: AC
  Administered 2014-09-14: 650 mg via ORAL
  Filled 2014-09-14: qty 2

## 2014-09-14 MED ORDER — LIP MEDEX EX OINT
TOPICAL_OINTMENT | Freq: Once | CUTANEOUS | Status: AC
  Administered 2014-09-14: 23:00:00 via TOPICAL
  Filled 2014-09-14: qty 7

## 2014-09-14 MED ORDER — TBO-FILGRASTIM 480 MCG/0.8ML ~~LOC~~ SOSY
480.0000 ug | PREFILLED_SYRINGE | Freq: Once | SUBCUTANEOUS | Status: AC
  Administered 2014-09-14: 480 ug via SUBCUTANEOUS
  Filled 2014-09-14: qty 0.8

## 2014-09-14 MED ORDER — SODIUM CHLORIDE 0.9 % IV BOLUS (SEPSIS)
1000.0000 mL | Freq: Once | INTRAVENOUS | Status: AC
  Administered 2014-09-15: 1000 mL via INTRAVENOUS

## 2014-09-14 MED ORDER — CLOPIDOGREL BISULFATE 75 MG PO TABS
75.0000 mg | ORAL_TABLET | Freq: Every day | ORAL | Status: DC
Start: 2014-09-15 — End: 2014-09-19
  Administered 2014-09-15 – 2014-09-19 (×5): 75 mg via ORAL
  Filled 2014-09-14 (×5): qty 1

## 2014-09-14 NOTE — ED Notes (Signed)
Per pt, dx at [facility].  Dx with pneumonia and sent home. Now pt with fever, low oxygen, shortness of breath. Elevated hr

## 2014-09-14 NOTE — ED Notes (Signed)
Requested Carmex from Pharmacy.

## 2014-09-14 NOTE — ED Notes (Signed)
Patient ambulated to restroom with minimal assistance. Patient to provide urine sample.

## 2014-09-14 NOTE — ED Notes (Signed)
Bed: WA04 Expected date:  Expected time:  Means of arrival:  Comments: Triage 2 

## 2014-09-14 NOTE — Telephone Encounter (Signed)
Left message to confirm appointment for 03/03.

## 2014-09-14 NOTE — H&P (Signed)
PCP:  [name], MD  Oncology [facility] [place] Bone marrow transplant  Chief Complaint:  Cough and fever  HPI: Shannon Rush is a 74 y.o. female   has a past medical history of Multiple myeloma (07/01/2012); Unspecified deficiency anemia; OSTEOPOROSIS (06/11/2010); Thoracic kyphosis (07/13/12); Cord compression (07/13/12); History of radiation therapy (07/13/12-07/27/12); CAD (coronary artery disease); and echocardiogram.   Presented with  Patient has hx of multiple myeloma sp Bone marrow transplant 08/20/ 2014 followed by [facility]. She is currently on Revlimid last dose was 2 days ago but currently this has been suspended due to PNA. Patient reports cough for the past 1 week.  She was seen at [facility] had a CXR done and was diagnosed with PNA she was given Vancomycin and ceftriaxone. She was discharged to home at that time her WBC was 6. Patient rapidly decompensated became febrile with worsening shortness of breath and presented to [facility] ER. She was found to be febrile up to 102.3 CXR confirmed PNA. WBC 0.6 ANC 0.5. She was noted to be hypotensive with BP  96/50.  She is sp 4 L.  Oncology has been consulted at this point the recommendation is to admit to [facility] for treatment of sepsis and neutropenic fever. Patient was given an injection of Tbo-Filgrastim 450 g. Oncology will follow up in the morning  Hospitalist was called for admission for Neutropenic fever, HCAP and sepsis  Review of Systems:    Pertinent positives include:   Fevers, chills, fatigue, productive cough,   Constitutional:  No weight loss, night sweats, weight loss  HEENT:  No headaches, Difficulty swallowing,Tooth/dental problems,Sore throat,  No sneezing, itching, ear ache, nasal congestion, post nasal drip,  Cardio-vascular:  No chest pain, Orthopnea, PND, anasarca, dizziness, palpitations.no Bilateral lower extremity swelling  GI:  No heartburn, indigestion, abdominal pain, nausea,  vomiting, diarrhea, change in bowel habits, loss of appetite, melena, blood in stool, hematemesis Resp:  no shortness of breath at rest. No dyspnea on exertion, No excess mucus, no No non-productive cough, No coughing up of blood.No change in color of mucus.No wheezing. Skin:  no rash or lesions. No jaundice GU:  no dysuria, change in color of urine, no urgency or frequency. No straining to urinate.  No flank pain.  Musculoskeletal:  No joint pain or no joint swelling. No decreased range of motion. No back pain.  Psych:  No change in mood or affect. No depression or anxiety. No memory loss.  Neuro: no localizing neurological complaints, no tingling, no weakness, no double vision, no gait abnormality, no slurred speech, no confusion  Otherwise ROS are negative except for above, 10 systems were reviewed  Past Medical History: Past Medical History  Diagnosis Date  . Multiple myeloma 07/01/2012  . Unspecified deficiency anemia   . OSTEOPOROSIS 06/11/2010    Multiple compression fractures; and spontaneous fracture of sternum Qualifier: Diagnosis of  By: [name], [name]     . 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 Cord compression 07/13/12    MRI- diffuse myeloma involvement of T-L spine  . History of radiation therapy 07/13/12-07/27/12    spinal cord compression T3-T10,left scapula  . CAD (coronary artery disease)     a. inf STEMI (in [place] >>> [facility]) >>> LHC (8/15- [place]):  pRCA 60%, mid to dist RCA 50% >>> med rx  . Hx of echocardiogram     Echo (03/02/14-[facility]  Normal  LVEF without significant regional wall motion abnormalities. Mild    Past Surgical History  Procedure Laterality Date  . Appendectomy    . Tubal ligation    . Cesarean section      x2   . Hip surgery  2009    left  . Elbow surgery    . Colonoscopy  2007    neg with Dr. [name]     Medications: Prior to Admission medications     Medication Sig Start Date End Date Taking? Authorizing Provider  acetaminophen (TYLENOL) 500 MG tablet Take 500 mg by mouth every 6 (six) hours as needed for moderate pain (pain).   Yes Historical Provider, MD  aspirin 81 MG EC tablet Take 81 mg by mouth every other day.  03/03/14  Yes Historical Provider, MD  atorvastatin (LIPITOR) 40 MG tablet Take 1 tablet (40 mg total) by mouth daily. 05/02/14  Yes Josue Hector, MD  calcium carbonate (OS-CAL) 600 MG TABS tablet Take 600 mg by mouth daily.   Yes Historical Provider, MD  clopidogrel (PLAVIX) 75 MG tablet Take 1 tablet (75 mg total) by mouth daily. 05/02/14  Yes Josue Hector, MD  guaiFENesin (ROBITUSSIN) 100 MG/5ML liquid Take 200 mg by mouth 3 (three) times daily as needed for cough (cough).   Yes Historical Provider, MD  metoprolol succinate (TOPROL-XL) 25 MG 24 hr tablet Take 1 tablet (25 mg total) by mouth daily. 05/02/14  Yes Josue Hector, MD  vitamin D, CHOLECALCIFEROL, 400 UNITS tablet Take 400 Units by mouth daily.   Yes Historical Provider, MD  morphine (MSIR) 15 MG tablet Take 1 tablet (15 mg total) by mouth every 6 (six) hours as needed for severe pain. Patient not taking: Reported on 08/25/2014 02/10/14   [name], MD    Allergies:   Allergies  Allergen Reactions  . Zosyn [Piperacillin Sod-Tazobactam So] Rash    Itching, rash and swelling of extremities  . Zithromax [Azithromycin]     Full body rash  . Ciprofloxacin Rash    Throwing up and stomach pain     Social History:  Ambulatory   independently   Lives at home With family     reports that she has never smoked. She has never used smokeless tobacco. She reports that she drinks about 3.5 oz of alcohol per week. She reports that she does not use illicit drugs.    Family History: family history includes Cancer in her brother; Glaucoma in her mother; Heart disease in her father and mother.    Physical Exam: Patient Vitals for the past 24 hrs:  BP Temp Temp  src Pulse Resp SpO2  09/14/14 2130 (!) 82/47 mmHg - - 99 25 97 %  09/14/14 2100 (!) 95/48 mmHg - - 115 18 90 %  09/14/14 2052 - 99.7 F (37.6 C) Oral - - -  09/14/14 2030 (!) 90/42 mmHg - - 104 13 91 %  09/14/14 2025 (!) 95/53 mmHg - - 108 18 92 %  09/14/14 2000 (!) 100/46 mmHg - - 119 26 93 %  09/14/14 1930 (!) 93/40 mmHg - - 116 18 97 %  09/14/14 1900 (!) 112/45 mmHg - - (!) 128 (!) 28 95 %  09/14/14 1830 151/65 mmHg - - - (!) 30 -  09/14/14 1755 - - - - - 92 %  09/14/14 1752 127/71 mmHg 102.3 F (39.1 C) Oral (!) 144 (!) 28 (!) 85 %    1. General:  in No Acute  distress 2. Psychological: Alert and    Oriented 3. Head/ENT:    Dry Mucous Membranes                          Head Non traumatic, neck supple                          Normal  Dentition 4. SKIN:  decreased Skin turgor,  Skin clean Dry and intact, macular  rash noted over the abdomen A she has stated she had similar rashes Zosyn in the past 5. Heart: Regular rate and rhythm no Murmur, Rub or gallop 6. Lungs: Clear to auscultation bilaterally, no wheezes or crackles   7. Abdomen: Soft, non-tender, Non distended 8. Lower extremities: no clubbing, cyanosis, or edema 9. Neurologically Grossly intact, moving all 4 extremities equally 10. MSK: Normal range of motion  body mass index is unknown because there is no weight on file.   Labs on Admission:   Results for orders placed or performed during the hospital encounter of 09/14/14 (from the past 24 hour(s))  CBC WITH DIFFERENTIAL     Status: Abnormal   Collection Time: 09/14/14  6:41 PM  Result Value Ref Range   WBC 0.6 (LL) 4.0 - 10.5 K/uL   RBC 2.82 (L) 3.87 - 5.11 MIL/uL   Hemoglobin 9.8 (L) 12.0 - 15.0 g/dL   HCT 29.4 (L) 36.0 - 46.0 %   MCV 104.3 (H) 78.0 - 100.0 fL   MCH 34.8 (H) 26.0 - 34.0 pg   MCHC 33.3 30.0 - 36.0 g/dL   RDW 17.3 (H) 11.5 - 15.5 %   Platelets 191 150 - 400 K/uL   Neutrophils Relative % 87 (H) 43 - 77 %   Lymphocytes Relative 9 (L) 12 - 46 %    Monocytes Relative 2 (L) 3 - 12 %   Eosinophils Relative 2 0 - 5 %   Basophils Relative 0 0 - 1 %   Neutro Abs 0.5 (L) 1.7 - 7.7 K/uL   Lymphs Abs 0.1 (L) 0.7 - 4.0 K/uL   Monocytes Absolute 0.0 (L) 0.1 - 1.0 K/uL   Eosinophils Absolute 0.0 0.0 - 0.7 K/uL   Basophils Absolute 0.0 0.0 - 0.1 K/uL   WBC Morphology DOHLE BODIES   Comprehensive metabolic panel     Status: Abnormal   Collection Time: 09/14/14  6:41 PM  Result Value Ref Range   Sodium 132 (L) 135 - 145 mmol/L   Potassium 3.0 (L) 3.5 - 5.1 mmol/L   Chloride 103 96 - 112 mmol/L   CO2 19 19 - 32 mmol/L   Glucose, Bld 177 (H) 70 - 99 mg/dL   BUN 15 6 - 23 mg/dL   Creatinine, Ser 0.95 0.50 - 1.10 mg/dL   Calcium 8.7 8.4 - 10.5 mg/dL   Total Protein 7.5 6.0 - 8.3 g/dL   Albumin 3.3 (L) 3.5 - 5.2 g/dL   AST 147 (H) 0 - 37 U/L   ALT 71 (H) 0 - 35 U/L   Alkaline Phosphatase 294 (H) 39 - 117 U/L   Total Bilirubin 1.2 0.3 - 1.2 mg/dL   GFR calc non Af Amer 58 (L) >90 mL/min   GFR calc Af Amer 68 (L) >90 mL/min   Anion gap 10 5 - 15  I-Stat CG4 Lactic Acid, ED (not at [facility])     Status: Abnormal   Collection Time: 09/14/14  6:53  PM  Result Value Ref Range   Lactic Acid, Venous 4.57 (HH) 0.5 - 2.0 mmol/L   Comment NOTIFIED PHYSICIAN   Urinalysis, Routine w reflex microscopic     Status: Abnormal   Collection Time: 09/14/14  9:00 PM  Result Value Ref Range   Color, Urine YELLOW YELLOW   APPearance CLOUDY (A) CLEAR   Specific Gravity, Urine 1.017 1.005 - 1.030   pH 5.0 5.0 - 8.0   Glucose, UA NEGATIVE NEGATIVE mg/dL   Hgb urine dipstick NEGATIVE NEGATIVE   Bilirubin Urine NEGATIVE NEGATIVE   Ketones, ur NEGATIVE NEGATIVE mg/dL   Protein, ur NEGATIVE NEGATIVE mg/dL   Urobilinogen, UA 0.2 0.0 - 1.0 mg/dL   Nitrite NEGATIVE NEGATIVE   Leukocytes, UA NEGATIVE NEGATIVE    UA no evidence of UTI  No results found for: HGBA1C  CrCl cannot be calculated (Unknown ideal weight.).  BNP (last 3 results) No results for  input(s): PROBNP in the last 8760 hours.  Other results:  I have pearsonaly reviewed this: ECG REPORT  Rate: 143  Rhythm: Sinus tachycardia ST&T Change: Repolarization abnormality but no acute ischemia   There were no vitals filed for this visit.   Cultures:    Component Value Date/Time   SDES SPUTUM 08/09/2014 1222   SDES SPUTUM 08/09/2014 1222   SPECREQUEST NONE 08/09/2014 1222   SPECREQUEST NONE 08/09/2014 1222   CULT  08/09/2014 1222    NORMAL OROPHARYNGEAL FLORA Performed at [facility] 08/09/2014 FINAL 08/09/2014 1222   REPTSTATUS 08/11/2014 FINAL 08/09/2014 1222     Radiological Exams on Admission: Dg Chest Port 1 View  09/14/2014   CLINICAL DATA:  Cough for 10 days. Fever for 3 days. Shortness of breath.  EXAM: PORTABLE CHEST - 1 VIEW  COMPARISON:  PA and lateral chest 12/20/2013 and 08/08/2014.  FINDINGS: Extensive airspace disease is seen in the right lower lung zone, worsened since the most recent prior examination. The left lung appears clear. Heart size is upper normal. No pneumothorax or pleural effusion. Remote left rib fractures are noted.  IMPRESSION: Worsened right lower lung zone airspace disease likely due to pneumonia. Recommend followup films to clearing.   Electronically Signed   By: Inge Rise M.D.   On: 09/14/2014 19:35    Chart has been reviewed  Assessment/Plan  74 year old female with history of Alzheimer myeloma status post bone marrow transplant at [facility] 2014 currently on Revlimid presents with evidence of sepsis and hypotension in the setting of neutropenia and hospital-acquired pneumonia  Present on Admission:  . Sepsis - given fever neutropenia hypotension and tachycardia meets criteria for severe sepsis will give IV fluids per sepsis protocol. Admit to step down. Discussed case with [facility] M. IV antibiotics. Given recent rash most likely secondary to ceftriaxone with prior history of allergic reaction to Zosyn  will  give aztreonam and vancomycin  . Pancytopenia - the setting of chemotherapy and sepsis currently hemoglobin stable platelet count stable will continue to monitor Bode  Tbo-Filgrastim has been administered  . Multiple myeloma - as per oncology, please contact [facility] oncology in the morning patient has been receiving care there  . HCAP (healthcare-associated pneumonia)-  - will admit for treatment of HCAP will start on appropriate antibiotic coverage. Oxygen provided as needed monitor and step down   Obtain sputum cultures, blood cultures if febrile or if decompensates.  Provide oxygen as needed.  . Febrile neutropenia- broad-spectrum antibiotics blood cultures pending  . Coronary atherosclerosis of  native coronary artery - continue Plavix  . Anemia, macrocytic stable continue to monitor   Prophylaxis:   Lovenox, Protonix  CODE STATUS:  FULL CODE   Other plan as per orders.  I have spent a total of 65 min on this admission extra time was taken to discuss care with CCM  Marleta Lapierre 09/14/2014, 10:14 PM  [facility]  Pager [phone]   after 2 AM please page floor coverage PA If 7AM-7PM, please contact the day team taking care of the patient  [email or web address]  Password [email or web address]

## 2014-09-14 NOTE — ED Provider Notes (Signed)
CSN: [id number]     Arrival date & time 09/14/14  1743 History   First MD Initiated Contact with Patient 09/14/14 1811     Chief Complaint  Patient presents with  . Pneumonia     (Consider location/radiation/quality/duration/timing/severity/associated sxs/prior Treatment) HPI   Shannon Rush is a 74 y.o. female who presents for evaluation of cough, fever, chills, shortness of breath and palpitations. Her symptoms worsened since she was treated earlier today at [facility]. She was treated there, with 2 different IV antibiotic infusions for right-sided pneumonia. Appointment today was for routine follow-up of multiple myeloma. She has been ill with cough and intermittent fever for about a week. She denies nausea, vomiting, weakness or dizziness. She's taking her usual medications. There are no other known modifying factors.   Past Medical History  Diagnosis Date  . Multiple myeloma 07/01/2012  . Unspecified deficiency anemia   . OSTEOPOROSIS 06/11/2010    Multiple compression fractures; and spontaneous fracture of sternum Qualifier: Diagnosis of  By: [name], [name]     . 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 Cord compression 07/13/12    MRI- diffuse myeloma involvement of T-L spine  . History of radiation therapy 07/13/12-07/27/12    spinal cord compression T3-T10,left scapula  . CAD (coronary artery disease)     a. inf STEMI (in [place] >>> [facility]) >>> LHC (8/15- [place]):  pRCA 60%, mid to dist RCA 50% >>> med rx  . Hx of echocardiogram     Echo (03/02/14-done at [facility]):  Normal LVEF without significant regional wall motion abnormalities. Mild    Past Surgical History  Procedure Laterality Date  . Appendectomy    . Tubal ligation    . Cesarean section      x2   . Hip surgery  2009    left  . Elbow surgery    . Colonoscopy  2007    neg with Dr. [name]   Family History  Problem Relation Age of Onset  . Glaucoma  Mother   . Heart disease Mother   . Heart disease Father   . Cancer Brother     stage IV prostate CA   History  Substance Use Topics  . Smoking status: Never Smoker   . Smokeless tobacco: Never Used  . Alcohol Use: 3.5 oz/week    7 Standard drinks or equivalent per week     Comment: 1 glass of wine a day   OB History    No data available     Review of Systems  All other systems reviewed and are negative.     Allergies  Zosyn; Zithromax; and Ciprofloxacin  Home Medications   Prior to Admission medications   Medication Sig Start Date End Date Taking? Authorizing Provider  acetaminophen (TYLENOL) 500 MG tablet Take 500 mg by mouth every 6 (six) hours as needed for moderate pain (pain).   Yes Historical Provider, MD  aspirin 81 MG EC tablet Take 81 mg by mouth every other day.  03/03/14  Yes Historical Provider, MD  atorvastatin (LIPITOR) 40 MG tablet Take 1 tablet (40 mg total) by mouth daily. 05/02/14  Yes Josue Hector, MD  calcium carbonate (OS-CAL) 600 MG TABS tablet Take 600 mg by mouth daily.   Yes Historical Provider, MD  clopidogrel (PLAVIX) 75 MG tablet Take 1 tablet (75 mg total) by mouth daily. 05/02/14  Yes Josue Hector, MD  guaiFENesin (  ROBITUSSIN) 100 MG/5ML liquid Take 200 mg by mouth 3 (three) times daily as needed for cough (cough).   Yes Historical Provider, MD  metoprolol succinate (TOPROL-XL) 25 MG 24 hr tablet Take 1 tablet (25 mg total) by mouth daily. 05/02/14  Yes Josue Hector, MD  vitamin D, CHOLECALCIFEROL, 400 UNITS tablet Take 400 Units by mouth daily.   Yes Historical Provider, MD  morphine (MSIR) 15 MG tablet Take 1 tablet (15 mg total) by mouth every 6 (six) hours as needed for severe pain. Patient not taking: Reported on 08/25/2014 02/10/14   [name], MD   BP 107/65 mmHg  Pulse 102  Temp(Src) 100.1 F (37.8 C) (Oral)  Resp 24  Ht 5' 7"  (1.702 m)  Wt 129 lb 3 oz (58.6 kg)  BMI 20.23 kg/m2  SpO2 95% Physical Exam  Constitutional:  She is oriented to person, place, and time. She appears well-developed.  Elderly, frail  HENT:  Head: Normocephalic and atraumatic.  Right Ear: External ear normal.  Left Ear: External ear normal.  Oral airway is intact. There is no swelling of the oral mucosa.  Eyes: Conjunctivae and EOM are normal. Pupils are equal, round, and reactive to light.  Neck: Normal range of motion and phonation normal. Neck supple.  Cardiovascular: Normal rate, regular rhythm and normal heart sounds.   Pulmonary/Chest: Effort normal and breath sounds normal. She exhibits no bony tenderness.  Abdominal: Soft. There is no tenderness.  Musculoskeletal: Normal range of motion.  Neurological: She is alert and oriented to person, place, and time. No cranial nerve deficit or sensory deficit. She exhibits normal muscle tone. Coordination normal.  Skin: Skin is warm, dry and intact.  Scattered flat, red patches of bilateral antecubital spaces, and right forearm. No distinct areas of urticaria, petechiae, skin sloughing or bleeding.  Psychiatric: She has a normal mood and affect. Her behavior is normal. Judgment and thought content normal.  Nursing note and vitals reviewed.   ED Course  Procedures (including critical care time)  18:22-Screen for sepsis, initial- qSofa score is negative, she has only elevated respiratory rate. Currently, mental status, and systolic blood pressure are normal. The patient has already received intravenous dosing of Rocephin, and vancomycin, today, starting at 1:30 PM. Arrangements were made for her to follow-up at the oncology infusion center here at [facility], tomorrow for additional dosing of intravenous medication.  Discussion with pharmacy, who recommends that patient be treated with aztreonam for pneumonia, and continue vancomycin treatment. Next doses would be due tomorrow.  20:40- case discussed with oncology, Dr. Lurlean Leyden, who agrees with hospitalization, here, and  recommends treatment with Granix ax 1 tonight. She will have Dr. Burr Medico see the patient tomorrow.  8:45 PM-Consult complete with Hospitalist. Patient case explained and discussed. She agrees to admit patient for further evaluation and treatment. Call ended at 2050   Medications  0.9 %  sodium chloride infusion (0 mLs Intravenous Stopped 09/14/14 2038)    Followed by  0.9 %  sodium chloride infusion (0 mLs Intravenous Stopped 09/14/14 2038)    Followed by  0.9 %  sodium chloride infusion (0 mLs Intravenous Stopped 09/14/14 2038)    Followed by  0.9 %  sodium chloride infusion (1,000 mLs Intravenous New Bag/Given 09/14/14 2041)  guaiFENesin (ROBITUSSIN) 100 MG/5ML solution 200 mg (not administered)  clopidogrel (PLAVIX) tablet 75 mg (not administered)  sodium chloride 0.9 % bolus 1,000 mL (1,000 mLs Intravenous Given 09/15/14 0004)  enoxaparin (LOVENOX) injection 40 mg (not  administered)  0.9 %  sodium chloride infusion (not administered)  potassium chloride 10 mEq in 100 mL IVPB (not administered)  vancomycin (VANCOCIN) 500 mg in sodium chloride 0.9 % 100 mL IVPB (not administered)  aztreonam (AZACTAM) 2 g in dextrose 5 % 50 mL IVPB (not administered)  acetaminophen (TYLENOL) tablet 650 mg (650 mg Oral Given 09/14/14 1835)  lip balm (CARMEX) ointment ( Topical Given 09/14/14 2233)  Tbo-Filgrastim (GRANIX) injection 480 mcg (480 mcg Subcutaneous Given 09/14/14 2150)  sodium chloride 0.9 % bolus 1,000 mL (1,000 mLs Intravenous New Bag/Given 09/14/14 2150)    Patient Vitals for the past 24 hrs:  BP Temp Temp src Pulse Resp SpO2 Height Weight  09/15/14 0004 107/65 mmHg - - (!) 102 (!) 24 95 % - -  09/14/14 2345 (!) 131/53 mmHg 100.1 F (37.8 C) Oral - - 90 % 5' 7"  (1.702 m) 129 lb 3 oz (58.6 kg)  09/14/14 2300 (!) 104/51 mmHg - - 102 23 91 % - -  09/14/14 2230 (!) 92/50 mmHg 98.9 F (37.2 C) Oral (!) 142 24 95 % - -  09/14/14 2130 (!) 82/47 mmHg - - 99 25 97 % - -  09/14/14 2100 (!) 95/48 mmHg - - 115 18  90 % - -  09/14/14 2052 - 99.7 F (37.6 C) Oral - - - - -  09/14/14 2030 (!) 90/42 mmHg - - 104 13 91 % - -  09/14/14 2025 (!) 95/53 mmHg - - 108 18 92 % - -  09/14/14 2000 (!) 100/46 mmHg - - 119 26 93 % - -  09/14/14 1930 (!) 93/40 mmHg - - 116 18 97 % - -  09/14/14 1900 (!) 112/45 mmHg - - (!) 128 (!) 28 95 % - -  09/14/14 1830 151/65 mmHg - - - (!) 30 - - -  09/14/14 1755 - - - - - 92 % - -  09/14/14 1752 127/71 mmHg 102.3 F (39.1 C) Oral (!) 144 (!) 28 (!) 85 % - -    CRITICAL CARE Performed by: Daleen Bo L Total critical care time: 55 minutes Critical care time was exclusive of separately billable procedures and treating other patients. Critical care was necessary to treat or prevent imminent or life-threatening deterioration. Critical care was time spent personally by me on the following activities: development of treatment plan with patient and/or surrogate as well as nursing, discussions with consultants, evaluation of patient's response to treatment, examination of patient, obtaining history from patient or surrogate, ordering and performing treatments and interventions, ordering and review of laboratory studies, ordering and review of radiographic studies, pulse oximetry and re-evaluation of patient's condition.      Labs Review Labs Reviewed  CBC WITH DIFFERENTIAL/PLATELET - Abnormal; Notable for the following:    WBC 0.6 (*)    RBC 2.82 (*)    Hemoglobin 9.8 (*)    HCT 29.4 (*)    MCV 104.3 (*)    MCH 34.8 (*)    RDW 17.3 (*)    Neutrophils Relative % 87 (*)    Lymphocytes Relative 9 (*)    Monocytes Relative 2 (*)    Neutro Abs 0.5 (*)    Lymphs Abs 0.1 (*)    Monocytes Absolute 0.0 (*)    All other components within normal limits  COMPREHENSIVE METABOLIC PANEL - Abnormal; Notable for the following:    Sodium 132 (*)    Potassium 3.0 (*)    Glucose, Bld 177 (*)  Albumin 3.3 (*)    AST 147 (*)    ALT 71 (*)    Alkaline Phosphatase 294 (*)    GFR  calc non Af Amer 58 (*)    GFR calc Af Amer 68 (*)    All other components within normal limits  URINALYSIS, ROUTINE W REFLEX MICROSCOPIC - Abnormal; Notable for the following:    APPearance CLOUDY (*)    All other components within normal limits  I-STAT CG4 LACTIC ACID, ED - Abnormal; Notable for the following:    Lactic Acid, Venous 4.57 (*)    All other components within normal limits  CULTURE, BLOOD (ROUTINE X 2)  CULTURE, BLOOD (ROUTINE X 2)  URINE CULTURE  CULTURE, EXPECTORATED SPUTUM-ASSESSMENT  GRAM STAIN  PATHOLOGIST SMEAR REVIEW  CBC WITH DIFFERENTIAL/PLATELET  LACTIC ACID, PLASMA  LACTIC ACID, PLASMA  PROCALCITONIN  PROTIME-INR  APTT  MAGNESIUM  PHOSPHORUS  LEGIONELLA ANTIGEN, URINE  STREP PNEUMONIAE URINARY ANTIGEN  COMPREHENSIVE METABOLIC PANEL  I-STAT CG4 LACTIC ACID, ED  TYPE AND SCREEN    Imaging Review Dg Chest Port 1 View  09/14/2014   CLINICAL DATA:  Cough for 10 days. Fever for 3 days. Shortness of breath.  EXAM: PORTABLE CHEST - 1 VIEW  COMPARISON:  PA and lateral chest 12/20/2013 and 08/08/2014.  FINDINGS: Extensive airspace disease is seen in the right lower lung zone, worsened since the most recent prior examination. The left lung appears clear. Heart size is upper normal. No pneumothorax or pleural effusion. Remote left rib fractures are noted.  IMPRESSION: Worsened right lower lung zone airspace disease likely due to pneumonia. Recommend followup films to clearing.   Electronically Signed   By: Inge Rise M.D.   On: 09/14/2014 19:35     EKG Interpretation   Date/Time:  Wednesday September 14 2014 18:16:09 EST Ventricular Rate:  143 PR Interval:  125 QRS Duration: 80 QT Interval:  278 QTC Calculation: 429 R Axis:   101 Text Interpretation:  Sinus tachycardia Right axis deviation  Repolarization abnormality, prob rate related Since last tracing rate  faster Confirmed by Jadian Karman  MD, Arynn Armand [phone]) on 09/14/2014 6:18:40 PM      MDM   Final  diagnoses:  Community acquired pneumonia  Sepsis, due to unspecified organism  Neutropenia  Multiple myeloma    Sepsis with pneumonia and neutropenia. Patient received antibiotics within 6 hours of arrival to the emergency department. Therefore was not treated with antibiotics in the emergency department. She received high-volume IV fluid resuscitation, for hypotension. Heart rate improved with IV fluids. Rest. Heart rate remained elevated. Admission arranged. Consultation has been made with oncology.  Nursing Notes Reviewed/ Care Coordinated Applicable Imaging Reviewed Interpretation of Laboratory Data incorporated into ED treatment   Plan: Admit   Richarda Blade, MD 09/15/14 [phone]

## 2014-09-14 NOTE — ED Notes (Signed)
Lead tech Edison Nasuti notified Dr Eulis Foster of elevated lactic acid.Marland Kitchenklj

## 2014-09-14 NOTE — Telephone Encounter (Signed)
Per staff message and POF I have scheduled appts. Advised scheduler of appts. JMW  

## 2014-09-14 NOTE — Progress Notes (Signed)
Attempted to call for report, ER nurse will return call when available.

## 2014-09-15 ENCOUNTER — Other Ambulatory Visit: Payer: Medicare Other

## 2014-09-15 ENCOUNTER — Ambulatory Visit: Payer: Medicare Other | Admitting: Hematology

## 2014-09-15 ENCOUNTER — Ambulatory Visit: Payer: Medicare Other

## 2014-09-15 DIAGNOSIS — D709 Neutropenia, unspecified: Secondary | ICD-10-CM

## 2014-09-15 DIAGNOSIS — J9601 Acute respiratory failure with hypoxia: Secondary | ICD-10-CM

## 2014-09-15 DIAGNOSIS — R6520 Severe sepsis without septic shock: Secondary | ICD-10-CM

## 2014-09-15 DIAGNOSIS — C9000 Multiple myeloma not having achieved remission: Secondary | ICD-10-CM

## 2014-09-15 DIAGNOSIS — J189 Pneumonia, unspecified organism: Secondary | ICD-10-CM

## 2014-09-15 DIAGNOSIS — A419 Sepsis, unspecified organism: Principal | ICD-10-CM

## 2014-09-15 LAB — CBC WITH DIFFERENTIAL/PLATELET
BASOS PCT: 0 % (ref 0–1)
Basophils Absolute: 0 10*3/uL (ref 0.0–0.1)
EOS ABS: 0.1 10*3/uL (ref 0.0–0.7)
Eosinophils Relative: 3 % (ref 0–5)
HCT: 24.5 % — ABNORMAL LOW (ref 36.0–46.0)
HEMOGLOBIN: 8.2 g/dL — AB (ref 12.0–15.0)
LYMPHS PCT: 3 % — AB (ref 12–46)
Lymphs Abs: 0.1 10*3/uL — ABNORMAL LOW (ref 0.7–4.0)
MCH: 34.5 pg — AB (ref 26.0–34.0)
MCHC: 33.5 g/dL (ref 30.0–36.0)
MCV: 102.9 fL — ABNORMAL HIGH (ref 78.0–100.0)
Monocytes Absolute: 0.1 10*3/uL (ref 0.1–1.0)
Monocytes Relative: 2 % — ABNORMAL LOW (ref 3–12)
Neutro Abs: 3.3 10*3/uL (ref 1.7–7.7)
Neutrophils Relative %: 92 % — ABNORMAL HIGH (ref 43–77)
Platelets: 148 10*3/uL — ABNORMAL LOW (ref 150–400)
RBC: 2.38 MIL/uL — ABNORMAL LOW (ref 3.87–5.11)
RDW: 17.3 % — AB (ref 11.5–15.5)
WBC Morphology: INCREASED
WBC: 3.6 10*3/uL — ABNORMAL LOW (ref 4.0–10.5)

## 2014-09-15 LAB — COMPREHENSIVE METABOLIC PANEL
ALK PHOS: 185 U/L — AB (ref 39–117)
ALT: 63 U/L — ABNORMAL HIGH (ref 0–35)
AST: 91 U/L — ABNORMAL HIGH (ref 0–37)
Albumin: 2.4 g/dL — ABNORMAL LOW (ref 3.5–5.2)
Anion gap: 6 (ref 5–15)
BUN: 11 mg/dL (ref 6–23)
CO2: 18 mmol/L — AB (ref 19–32)
Calcium: 7.1 mg/dL — ABNORMAL LOW (ref 8.4–10.5)
Chloride: 110 mmol/L (ref 96–112)
Creatinine, Ser: 0.85 mg/dL (ref 0.50–1.10)
GFR calc Af Amer: 77 mL/min — ABNORMAL LOW (ref >90)
GFR calc non Af Amer: 67 mL/min — ABNORMAL LOW (ref >90)
GLUCOSE: 118 mg/dL — AB (ref 70–99)
Potassium: 3.3 mmol/L — ABNORMAL LOW (ref 3.5–5.1)
SODIUM: 134 mmol/L — AB (ref 135–145)
Total Bilirubin: 0.7 mg/dL (ref 0.3–1.2)
Total Protein: 5.7 g/dL — ABNORMAL LOW (ref 6.0–8.3)

## 2014-09-15 LAB — LACTIC ACID, PLASMA
LACTIC ACID, VENOUS: 2 mmol/L (ref 0.5–2.0)
LACTIC ACID, VENOUS: 2.2 mmol/L — AB (ref 0.5–2.0)
Lactic Acid, Venous: 2.3 mmol/L (ref 0.5–2.0)

## 2014-09-15 LAB — TYPE AND SCREEN
ABO/RH(D): A POS
ANTIBODY SCREEN: NEGATIVE

## 2014-09-15 LAB — PROTIME-INR
INR: 1.16 (ref 0.00–1.49)
Prothrombin Time: 14.9 seconds (ref 11.6–15.2)

## 2014-09-15 LAB — LEGIONELLA ANTIGEN, URINE

## 2014-09-15 LAB — GLUCOSE, CAPILLARY
Glucose-Capillary: 123 mg/dL — ABNORMAL HIGH (ref 70–99)
Glucose-Capillary: 135 mg/dL — ABNORMAL HIGH (ref 70–99)

## 2014-09-15 LAB — PROCALCITONIN: Procalcitonin: 32.98 ng/mL

## 2014-09-15 LAB — PATHOLOGIST SMEAR REVIEW

## 2014-09-15 LAB — MAGNESIUM: MAGNESIUM: 1.3 mg/dL — AB (ref 1.5–2.5)

## 2014-09-15 LAB — PHOSPHORUS: PHOSPHORUS: 2.7 mg/dL (ref 2.3–4.6)

## 2014-09-15 LAB — APTT: APTT: 34 s (ref 24–37)

## 2014-09-15 LAB — STREP PNEUMONIAE URINARY ANTIGEN: Strep Pneumo Urinary Antigen: NEGATIVE

## 2014-09-15 MED ORDER — BOOST / RESOURCE BREEZE PO LIQD: 1.0000 | Freq: Three times a day (TID) | ORAL | Status: DC

## 2014-09-15 MED ORDER — MAGNESIUM SULFATE 2 GM/50ML IV SOLN
2.0000 g | Freq: Once | INTRAVENOUS | Status: AC
  Administered 2014-09-15: 2 g via INTRAVENOUS
  Filled 2014-09-15: qty 50

## 2014-09-15 MED ORDER — DEXTROSE-NACL 5-0.45 % IV SOLN
INTRAVENOUS | Status: DC
  Administered 2014-09-15: 125 mL/h via INTRAVENOUS
  Administered 2014-09-15 – 2014-09-16 (×2): via INTRAVENOUS

## 2014-09-15 MED ORDER — POTASSIUM CHLORIDE 10 MEQ/100ML IV SOLN
10.0000 meq | INTRAVENOUS | Status: AC
  Administered 2014-09-15 (×3): 10 meq via INTRAVENOUS
  Filled 2014-09-15 (×3): qty 100

## 2014-09-15 MED ORDER — ACETAMINOPHEN 325 MG PO TABS
650.0000 mg | ORAL_TABLET | Freq: Once | ORAL | Status: AC
  Administered 2014-09-15: 650 mg via ORAL
  Filled 2014-09-15: qty 2

## 2014-09-15 MED ORDER — PANTOPRAZOLE SODIUM 40 MG PO TBEC
40.0000 mg | DELAYED_RELEASE_TABLET | Freq: Every day | ORAL | Status: DC
  Administered 2014-09-15 – 2014-09-19 (×5): 40 mg via ORAL
  Filled 2014-09-15 (×5): qty 1

## 2014-09-15 MED ORDER — BOOST / RESOURCE BREEZE PO LIQD
1.0000 | Freq: Two times a day (BID) | ORAL | Status: DC
  Administered 2014-09-16 – 2014-09-19 (×8): 1 via ORAL

## 2014-09-15 MED ORDER — SODIUM CHLORIDE 0.9 % IV BOLUS (SEPSIS): 1000.0000 mL | Freq: Once | INTRAVENOUS | Status: DC

## 2014-09-15 MED ORDER — ACETAMINOPHEN 325 MG PO TABS
650.0000 mg | ORAL_TABLET | Freq: Four times a day (QID) | ORAL | Status: DC | PRN
  Administered 2014-09-15 – 2014-09-16 (×2): 650 mg via ORAL
  Filled 2014-09-15 (×2): qty 2

## 2014-09-15 NOTE — Care Management Note (Signed)
CARE MANAGEMENT NOTE 09/15/2014  Patient:  Shannon Rush, Shannon Rush   Account Number:  [id number]  Date Initiated:  09/15/2014  Documentation initiated by:  Michille Mcelrath  Subjective/Objective Assessment:   admitted with sepsis versus pna or both     Action/Plan:   home whe3n stable   Anticipated DC Date:  09/18/2014   Anticipated DC Plan:  HOME/SELF CARE         Choice offered to / List presented to:             Status of service:  In process, will continue to follow Medicare Important Message given?   (If response is "NO", the following Medicare IM given date fields will be blank) Date Medicare IM given:   Medicare IM given by:   Date Additional Medicare IM given:   Additional Medicare IM given by:    Discharge Disposition:    Per UR Regulation:  Reviewed for med. necessity/level of care/duration of stay  If discussed at [place] of Stay Meetings, dates discussed:    Comments:  September 15, 2014/Analyah Mcconnon L. Rosana Hoes, RN, BSN, CCM. Case Management [place] [phone] No discharge needs present of time of review.

## 2014-09-15 NOTE — Progress Notes (Signed)
CRITICAL VALUE ALERT  Critical value received:  Lactic Acid   Date of notification:  09/14/14  Time of notification:  6859  Critical value read back yes   Nurse who received alert:  Theodosia Quay Rn  MD notified (1st page):    Time of first page:  0445  MD notified (2nd page):  Time of second page:  Responding MD:  Tylene Fantasia NP   Time MD responded:  Tylene Fantasia Np, No new orders.

## 2014-09-15 NOTE — Progress Notes (Signed)
[facility] Progress Note Patient Name: Shannon Rush DOB: 09-11-1940 MRN: [id number]   Date of Service  09/15/2014  HPI/Events of Note  Called d/t hip pain. Patient request Tylenol which she takes at home for similar pain. AST = 91 and ALT = 63.   eICU Interventions  Will order Tylenol 650 mg PO Q 6 hours PRN pain. Trend transaminases. Hopefully, will only require one or two doses for pain relief.     Intervention Category Minor Interventions: Routine modifications to care plan (e.g. PRN medications for pain, fever)  Shannon Rush 09/15/2014, 3:32 PM

## 2014-09-15 NOTE — Progress Notes (Addendum)
Patient ID: Shannon Rush, female   DOB: 23-Jun-1941, 74 y.o.   MRN: [id number] [facility] PROGRESS NOTE  RIMSHA TREMBLEY PGF:[id number] DOB: 1941-03-02 DOA: 2014-09-19 PCP: [name], MD  Brief narrative:    74 y.o. female with past medical history of multiple myeloma, has had a bone marrow transplant 03/03/2013, follows at [facility], patient currently on Revlimid, further history of cord compression in 2013, history of radiation therapy (07/13/12-07/27/12). Patient was recently seen in [facility], reported she had chest x-ray done and was diagnosed with pneumonia. She was given vancomycin and Rocephin and then subsequently discharged home. Patient rapidly decompensated since then.  She also reported ongoing cough, fatigue and weakness.  In ED, patient was found to be hypotensive with blood pressure of 82/47, heart rate 144, respiratory rate 32, oxygen saturation 85% on nasal cannula oxygen support, T max 102.22F. Blood work revealed severe neutropenia, white blood cell count 0.6, hemoglobin 9.8, normal platelet count. Sodium was 132 and potassium 3.0. Chest x-ray showed worsening right lower lung airspace opacities likely due to pneumonia. Patient was started on broad-spectrum antibiotics for treatment of sepsis. She was started on aztreonam and vancomycin. Critical care medicine consulted for help with sepsis management.   Assessment/Plan:    Principal problem:  Acute respiratory failure with hypoxia / healthcare associated pneumonia - Patient was hypoxic on the admission with oxygen saturation of 85% on room air. Chest x-ray on the admission showed right lower lung opacities likely due to pneumonia. - Patient was started on broad-spectrum antibiotics, vancomycin and aztreonam. - Respiratory status is stable at this time. Legionella and strep pneumonia results are pending. - Appreciate critical care medicine consult.  Active Problems: Severe sepsis secondary to healthcare  associated pneumonia - Sepsis criteria met on the admission with hypotension, tachycardia, tachypnea, fever, hypoxia. In addition patient neutropenic with white blood cell count 0.6 (neutrophils 0.5), lactic acid 4.57. Procalcitonin level 32.98.Chest x-ray with evidence of pneumonia in right lower lung lobe. - Patient was started on broad-spectrum antibiotics, vancomycin and aztreonam. Blood cultures obtained, pending. - Blood pressure still on soft side. Critical care medicine consulted for help with sepsis management. Patient may required pressor support if blood pressure continues to be soft. - Continue to monitor in step down unit.  Febrile neutropenia - Likely sequela of chemotherapy, Revlimid - White blood cell count 0.6 on the admission. Subsequently improved to 3.6. - Patient started on broad-spectrum antibiotics. Febrile on the admission but no fevers since then. - Follow up blood culture results.  History of multiple myeloma - Currently on Revlimid which is put on hold because of acute infection, sepsis  Anemia of chronic disease - Secondary to history of multiple myeloma and chemotherapy - Hemoglobin is 9.8. No reports of bleeding. No current indications for transfusion  Hypokalemia - Likely secondary to sepsis. Potassium supplemented.  History of coronary artery disease - Continue Plavix   DVT Prophylaxis  -  Lovenox subcutaneous ordered    Code Status: Full.  Family Communication:  plan of care discussed with the patient Disposition:  in step down unit, hypotensive. May require pressor support. Critical care medicine consulted.   IV access:  Peripheral IV  Procedures and diagnostic studies:    Dg Chest Port 1 View 09-19-2014   Worsened right lower lung zone airspace disease likely due to pneumonia. Recommend followup films to clearing.      Medical Consultants:  CCM  Other Consultants:  None   IAnti-Infectives:   Vanco 09/19/2014 --> Aztreonam  09/14/2014  -->   Leisa Lenz, MD  [facility] Pager [phone]  If 7PM-7AM, please contact night-coverage [email or web address] Password [email or web address] 09/15/2014, 10:05 AM   LOS: 1 day    HPI/Subjective: No acute overnight events.  Objective: Filed Vitals:   09/15/14 0758 09/15/14 0800 09/15/14 0805 09/15/14 0815  BP:  _0  Pulse:  95 89 94  Temp: 98.6 F (37 C)     TempSrc:      Resp:  _1 Height:      Weight:      SpO2:  92% 92% 95%    Intake/Output Summary (Last 24 hours) at 09/15/14 1005 Last data filed at 09/15/14 0502  Gross per 24 hour  Intake 1383.75 ml  Output    500 ml  Net 883.75 ml    Exam:   General:  Pt is alert, follows commands appropriately, not in acute distress  Cardiovascular: Regular rate and rhythm, S1/S2, no murmurs  Respiratory: Clear to auscultation bilaterally, no wheezing, no crackles, no rhonchi  Abdomen: Soft, non tender, non distended, bowel sounds present  Extremities: No edema, pulses DP and PT palpable bilaterally  Neuro: Grossly nonfocal  Data Reviewed: Basic Metabolic Panel:  Recent Labs Lab 09/14/14 1841 09/15/14 0320  NA 132* 134*  K 3.0* 3.3*  CL 103 110  CO2 19 18*  GLUCOSE 177* 118*  BUN 15 11  CREATININE 0.95 0.85  CALCIUM 8.7 7.1*  MG  --  1.3*  PHOS  --  2.7   Liver Function Tests:  Recent Labs Lab 09/14/14 1841 09/15/14 0320  AST 147* 91*  ALT 71* 63*  ALKPHOS 294* 185*  BILITOT 1.2 0.7  PROT 7.5 5.7*  ALBUMIN 3.3* 2.4*   No results for input(s): LIPASE, AMYLASE in the last 168 hours. No results for input(s): AMMONIA in the last 168 hours. CBC:  Recent Labs Lab 09/14/14 1841 09/15/14 0320  WBC 0.6* 3.6*  NEUTROABS 0.5* 3.3  HGB 9.8* 8.2*  HCT 29.4* 24.5*  MCV 104.3* 102.9*  PLT 191 148*   Cardiac Enzymes: No results for input(s): CKTOTAL, CKMB, CKMBINDEX, TROPONINI in the last 168 hours. BNP: Invalid input(s): POCBNP CBG: No results for input(s): GLUCAP in the last 168  hours.  No results found for this or any previous visit (from the past 240 hour(s)).   Scheduled Meds: . aztreonam  2 g Intravenous 3 times per day  . clopidogrel  75 mg Oral Daily  . enoxaparin (LOVENOX) injection  40 mg Subcutaneous QHS  . potassium chloride  10 mEq Intravenous Q1 Hr x 3  . vancomycin  500 mg Intravenous BID   Continuous Infusions: . sodium chloride 1,000 mL (09/14/14 2041)  . sodium chloride 125 mL/hr at 09/15/14 0118

## 2014-09-15 NOTE — Progress Notes (Signed)
INITIAL NUTRITION ASSESSMENT  DOCUMENTATION CODES Per approved criteria  -Not Applicable   INTERVENTION: - Resource Breeze po BID, each supplement provides 250 kcal and 9 grams of protein - RD will continue to monitor  NUTRITION DIAGNOSIS: Inadequate oral intake related to cough and fever as evidenced by reported poor po.   Goal: Pt to meet >/= 90% of their estimated nutrition needs   Monitor:  Weight trend, po intake, acceptance of supplements, labs  Reason for Assessment: Malnutrition Screening Tool  74 y.o. female  Admitting Dx: <principal problem not specified>  ASSESSMENT: Patient has hx of multiple myeloma sp Bone marrow transplant 08/20/ 2014 followed by [facility]. She is currently on Revlimid last dose was 2 days ago but currently this has been suspended due to PNA. Patient reports cough for the past 1 week. She was seen at [facility] had a CXR done and was diagnosed with PNA she was given Vancomycin and ceftriaxone. She was discharged to home at that time her WBC was 6. Patient rapidly decompensated became febrile with worsening shortness of breath and presented to [facility] ER. She was found to be febrile up to 102.3CXR confirmed PNA. WBC 0.6 ANC 0.5. She was noted to be hypotensive with BP 96/50. She is sp 4 L.   - Pt ate most of her breakfast of a bagel, yogurt and grapes. She did not order lunch due to not feeling well. RD helped pt order dinner of chicken noodle soup, chef salad, and a fruit cup. Pt said that she does feel like eating dinner.  - No recent weight loss.  - Labs reviewed Na and K low  Height: Ht Readings from Last 1 Encounters:  09/14/14 _0  (1.702 m)    Weight: Wt Readings from Last 1 Encounters:  09/15/14 131 lb 2.8 oz (59.5 kg)    Ideal Body Weight: 61.6 kg  % Ideal Body Weight: 97%  Wt Readings from Last 10 Encounters:  09/15/14 131 lb 2.8 oz (59.5 kg)  08/25/14 128 lb 4.8 oz (58.196 kg)  08/17/14 129 lb (58.514 kg)  08/09/14 128 lb 12 oz (58.4  kg)  07/21/14 126 lb 12.8 oz (57.516 kg)  06/23/14 126 lb 8 oz (57.38 kg)  05/12/14 127 lb 12.8 oz (57.97 kg)  04/14/14 125 lb 12.8 oz (57.063 kg)  04/11/14 127 lb 14.4 oz (58.015 kg)  03/11/14 122 lb 14.4 oz (55.747 kg)    Usual Body Weight: 120 lbs  % Usual Body Weight: 109%  BMI:  Body mass index is 20.54 kg/(m^2).  Estimated Nutritional Needs: Kcal: 1600-1800 Protein: 80-90 g Fluid: 1.7 L/day  Skin: intact  Diet Order: Diet Heart  EDUCATION NEEDS: -Education needs addressed   Intake/Output Summary (Last 24 hours) at 09/15/14 1614 Last data filed at 09/15/14 1500  Gross per 24 hour  Intake 2844.16 ml  Output    500 ml  Net 2344.16 ml    Last BM: 3/3  Labs:   Recent Labs Lab 09/14/14 1841 09/15/14 0320  NA 132* 134*  K 3.0* 3.3*  CL 103 110  CO2 19 18*  BUN 15 11  CREATININE 0.95 0.85  CALCIUM 8.7 7.1*  MG  --  1.3*  PHOS  --  2.7  GLUCOSE 177* 118*    CBG (last 3)  No results for input(s): GLUCAP in the last 72 hours.  Scheduled Meds: . aztreonam  2 g Intravenous 3 times per day  . clopidogrel  75 mg Oral Daily  . enoxaparin (LOVENOX)  injection  40 mg Subcutaneous QHS  . pantoprazole  40 mg Oral Daily  . vancomycin  500 mg Intravenous BID    Continuous Infusions: . dextrose 5 % and 0.45% NaCl 125 mL/hr at 09/15/14 1329    Past Medical History  Diagnosis Date  . Multiple myeloma 07/01/2012  . Unspecified deficiency anemia   . OSTEOPOROSIS 06/11/2010    Multiple compression fractures; and spontaneous fracture of sternum Qualifier: Diagnosis of  By: [name], [name]     . 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 Cord compression 07/13/12    MRI- diffuse myeloma involvement of T-L spine  . History of radiation therapy 07/13/12-07/27/12    spinal cord compression T3-T10,left scapula  . CAD (coronary artery disease)     a. inf STEMI (in [place] >>> [facility]) >>> LHC (8/15- [place]):  pRCA 60%, mid to dist RCA 50%  >>> med rx  . Hx of echocardiogram     Echo (03/02/14-done at [facility]):  Normal LVEF without significant regional wall motion abnormalities. Mild     Past Surgical History  Procedure Laterality Date  . Appendectomy    . Tubal ligation    . Cesarean section      x2   . Hip surgery  2009    left  . Elbow surgery    . Colonoscopy  2007    neg with Dr. Daralene Milch [place], [place], [place]

## 2014-09-15 NOTE — Progress Notes (Signed)
PULMONARY / CRITICAL CARE MEDICINE   Name: Shannon Rush MRN: [id number] DOB: 1941-02-08    ADMISSION DATE:  09/14/2014 CONSULTATION DATE:  09/15/2014  REFERRING MD :  Charlies Silvers   CHIEF COMPLAINT:  sepsis  INITIAL PRESENTATION: 74 year old female with PMH multiple myeloma (s/p BMT 02/2013, XRT 12/13-1/14, currently on Revlimid) presented to ED on 3/2 with cough, increasing SOB, and fever. Seen at [facility] where CXR reportedly showed PNA and patient was given Vancomycin & ceftriaxone, and DC'd home with arrangements to receive additional IV antibiotics at [facility] oncology infusion center Patient presented to ED after increasing symptoms where she was neutropenic, hypotensive, and hypoxic on RA. PCCM asked to see patient for sepsis managment  STUDIE 3/2 CXR - RLL airspace opacity   SIGNIFICANT EVENTS:   HISTORY OF PRESENT ILLNESS:  74 year old female with PMH multiple myeloma (s/p BMT 02/2013, XRT 12/13-1/14, currently on Revlimid) presented to ED with cough, increasing SOB, and fever. Patient was seen at [facility] for reports of cough & feeling ill x 1 week, where CXR reportedly showed PNA and patient was given Vancomycin & ceftriaxone, and DC'd home with arrangements to receive additional IV antibiotics at [facility] oncology infusion center. After DC home, patient had fever and increase in SOB prompting her to seek treatment at [facility] ED. In ED patient hypotensive, tachycardic, tachypnic, hypoxic, and febrile. Work-up revealed severe neutropenia (WBC 0.6, ANC 0.5) and CXR showed right lower lung airspace opacities. Lactic acid 4.57 and procalcitonin 32.98. Sepsis management started - given 4L fluid for hypotension/sepsis. PCCM asked to see for sepsis management.   Of note, patient recently traveled to [place] for > 1 week, arrived home evening of 3/1 (symptoms as above). Denies any bug bites during trip. Reports stepping on a sea urchin with left foot with "splinter" still in foot. Small substance  visible under skin of left foot. Skin intact, no redness or edema noted. Patient denies pain unless putting pressure directly on area.   Patient was hospitalized 08/08/14-08/13/14 for pneumonia (strep pneumoniae antigen +, RSV B +). Completed course of empiric antibiotics.   REVIEW OF SYSTEMS:  + dyspnea, fatigue  Past Medical History  Diagnosis Date  . Multiple myeloma 07/01/2012  . Unspecified deficiency anemia   . OSTEOPOROSIS 06/11/2010    Multiple compression fractures; and spontaneous fracture of sternum Qualifier: Diagnosis of  By: [name], [name]     . 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 Cord compression 07/13/12    MRI- diffuse myeloma involvement of T-L spine  . History of radiation therapy 07/13/12-07/27/12    spinal cord compression T3-T10,left scapula  . CAD (coronary artery disease)     a. inf STEMI (in [place] >>> [facility]) >>> LHC (8/15- [place]):  pRCA 60%, mid to dist RCA 50% >>> med rx  . Hx of echocardiogram     Echo (03/02/14-done at [facility]):  Normal LVEF without significant regional wall motion abnormalities. Mild      Family History  Problem Relation Age of Onset  . Glaucoma Mother   . Heart disease Mother   . Heart disease Father   . Cancer Brother     stage IV prostate CA     History   Social History  . Marital Status: Married    Spouse Name: Fritz Pickerel  . Number of Children: 2  . Years of Education: N/A   Occupational History  . Retired-Pyschology  Faculty [employer]   Social History Main Topics  . Smoking status: Never Smoker   . Smokeless tobacco: Never Used  . Alcohol Use: 3.5 oz/week    7 Standard drinks or equivalent per week     Comment: 1 glass of wine a day  . Drug Use: No  . Sexual Activity: Not on file   Other Topics Concern  . Not on file   Social History Narrative   Health Care POA:    Emergency Contact: spouse, Enolia Koepke [phone]   End of Life Plan:    Who  lives with you: spouse, 2 story home   Any pets: Dog, "AKU"   Diet: Patient has a varied diet of protein, vegetables, starches.  Limits red meats.   Exercise: Patient exercises atleast 3 times a week for over an hour.   Seatbelts: Patient reports wearing her seatbelt when in vehicle.    Nancy Fetter Exposure/Protection: Patient reports wearing sunscreen daily.   Hobbies: Basketball, gardening, swimming, reading, biking           Allergies  Allergen Reactions  . Zosyn [Piperacillin Sod-Tazobactam So] Rash    Itching, rash and swelling of extremities  . Zithromax [Azithromycin]     Full body rash  . Ciprofloxacin Rash    Throwing up and stomach pain      @encmedstart @    SUBJECTIVE: no distress  VITAL SIGNS: Temp:  [98.6 F (37 C)-102.3 F (39.1 C)] 98.6 F (37 C) (03/03 0758) Pulse Rate:  [89-144] 94 (03/03 0815) Resp:  [13-32] 27 (03/03 0815) BP: (73-151)/(34-71) 80/36 mmHg (03/03 0815) SpO2:  [85 %-97 %] 95 % (03/03 0815) Weight:  [58.6 kg (129 lb 3 oz)-59.5 kg (131 lb 2.8 oz)] 59.5 kg (131 lb 2.8 oz) (03/03 0500) 2 liters HEMODYNAMICS:   VENTILATOR SETTINGS:   INTAKE / OUTPUT:  Intake/Output Summary (Last 24 hours) at 09/15/14 1050 Last data filed at 09/15/14 0502  Gross per 24 hour  Intake 1383.75 ml  Output    500 ml  Net 883.75 ml    PHYSICAL EXAMINATION: General:  Ill-appearing female in no acute distress.  Neuro:  Alert, oriented x 4. No focal deficits.  HEENT:  Mucous membranes moist. No JVD.  Cardiovascular:  RRR, no MGR.  Lungs:  Clear with exception of crackles right base.  Abdomen:  Soft, nontender. Bowel sounds present.  Musculoskeletal:  BLE 2+ pitting edema.  Skin:  Grossly intact.   LABS:  CBC  Recent Labs Lab 09/14/14 1841 09/15/14 0320  WBC 0.6* 3.6*  HGB 9.8* 8.2*  HCT 29.4* 24.5*  PLT 191 148*   Coag's  Recent Labs Lab 09/15/14 0002  APTT 34  INR 1.16   BMET  Recent Labs Lab 09/14/14 1841 09/15/14 0320  NA 132* 134*   K 3.0* 3.3*  CL 103 110  CO2 19 18*  BUN 15 11  CREATININE 0.95 0.85  GLUCOSE 177* 118*   Electrolytes  Recent Labs Lab 09/14/14 1841 09/15/14 0320  CALCIUM 8.7 7.1*  MG  --  1.3*  PHOS  --  2.7   Sepsis Markers  Recent Labs Lab 09/14/14 1853 09/15/14 0002 09/15/14 0324  LATICACIDVEN 4.57* 2.0 2.3*  PROCALCITON  --  32.98  --    ABG No results for input(s): PHART, PCO2ART, PO2ART in the last 168 hours. Liver Enzymes  Recent Labs Lab 09/14/14 1841 09/15/14 0320  AST 147* 91*  ALT 71* 63*  ALKPHOS 294* 185*  BILITOT 1.2 0.7  ALBUMIN 3.3* 2.4*   Cardiac Enzymes No results for input(s): TROPONINI, PROBNP in the last 168 hours. Glucose No results for input(s): GLUCAP in the last 168 hours.  Imaging Dg Chest Port 1 View  09/14/2014   CLINICAL DATA:  Cough for 10 days. Fever for 3 days. Shortness of breath.  EXAM: PORTABLE CHEST - 1 VIEW  COMPARISON:  PA and lateral chest 12/20/2013 and 08/08/2014.  FINDINGS: Extensive airspace disease is seen in the right lower lung zone, worsened since the most recent prior examination. The left lung appears clear. Heart size is upper normal. No pneumothorax or pleural effusion. Remote left rib fractures are noted.  IMPRESSION: Worsened right lower lung zone airspace disease likely due to pneumonia. Recommend followup films to clearing.   Electronically Signed   By: Inge Rise M.D.   On: 09/14/2014 19:35     ASSESSMENT / PLAN:  PULMONARY HCAP  P:   See ID section Continue [place] 02, wean as tolerated    CARDIOVASCULAR A:  Hypotension in setting of severe sepsis vs septic shock - MAP inconsistently >65  H/o STEMI  P:  Fluid bolus now.. Aim for SBP > 90 assuming f/u lactate still trending down Add levophed if SBP goal not maintained  Continue home antiplatelet therapy Ck cortisol  RENAL Hypokalemia Hypomagnesemia Hypocalcemia Metabolic acidosis + NAG-->likely d/t chlorine load from NS boluses   P:   Replace  electrolytes and Mg Replace Mg.  Trend BMET.  Change MIVF to D51/2  GASTROINTESTINAL A: no acute      .  P:   Add stress ulcer prophylaxis - pantoprazole.  Heart diet.   HEMATOLOGIC Anemia Neutropenia  H/o multiple myeloma  P:  Management per oncology  Trend CBC VTE prophylaxis   INFECTIOUS Severe sepsis r/t HCAP - has received adequate volume resuscitation for sepsis.  P:   Trend lactate.  Trend procalcitonin.  BCx2 3/2 >> UC 3/2 >> Sputum  Legionella antigen urine 3/2 >> Strep pneumoniae urine 3/2 >>  Antibiotics:  Aztreonam 3/2 >> Vancomycin 3/2 >> @[facility]  3/2: vancomycin x 1 dose & ceftriaxone x 1 dose   ENDOCRINE A:  No active issues  P:   Continue CBG  NEUROLOGIC A:  No active issues  P:   Continue to assess pain, delirium, and sleep disturbances.   FAMILY  - Updates:      TODAY'S SUMMARY:  74 year old female with PMH multiple myeloma (s/p BMT 02/2013, XRT 12/13-1/14, currently on Revlimid) presented to ED with febrile neutropenia and severe sepsis r/t HCAP. Volume resuscitated >64m/kg, cultures obtained, and antibiotics started in ED. Lactate decreasing from admission and MAP inconsistently >65. Will repeat lactate, and consider adding norepinephrine and transfer to ICU if MAPs not maintained at goal.      PErick ColaceACNP-BC LTananaPager # [phone] # [phone] no answer 09/15/2014, 10:50 AM   Attending Note:  I have examined patient, reviewed labs, studies and notes. I have discussed the case with PJerrye Bushy and I agree with the data and plans as amended above.   RBaltazar Apo MD, PhD 09/16/2014, 7:43 AM [facility] [phone] if no answer [phone]

## 2014-09-15 NOTE — Progress Notes (Signed)
ANTIBIOTIC CONSULT NOTE - INITIAL  Pharmacy Consult for vancomycin, aztreonam Indication: Neutropenic fever, HCAP and sepsis  Allergies  Allergen Reactions  . Zosyn [Piperacillin Sod-Tazobactam So] Rash    Itching, rash and swelling of extremities  . Zithromax [Azithromycin]     Full body rash  . Ciprofloxacin Rash    Throwing up and stomach pain     Patient Measurements: Height: 5' 7"  (170.2 cm) Weight: 129 lb 3 oz (58.6 kg) IBW/kg (Calculated) : 61.6 Adjusted Body Weight:   Vital Signs: Temp: 100.1 F (37.8 C) (03/02 2345) Temp Source: Oral (03/02 2345) BP: 107/65 mmHg (03/03 0004) Pulse Rate: 102 (03/03 0004) Intake/Output from previous day:   Intake/Output from this shift:    Labs:  Recent Labs  09/14/14 1841  WBC 0.6*  HGB 9.8*  PLT 191  CREATININE 0.95   Estimated Creatinine Clearance: 49.5 mL/min (by C-G formula based on Cr of 0.95). No results for input(s): VANCOTROUGH, VANCOPEAK, VANCORANDOM, GENTTROUGH, GENTPEAK, GENTRANDOM, TOBRATROUGH, TOBRAPEAK, TOBRARND, AMIKACINPEAK, AMIKACINTROU, AMIKACIN in the last 72 hours.   Microbiology: No results found for this or any previous visit (from the past 720 hour(s)).  Medical History: Past Medical History  Diagnosis Date  . Multiple myeloma 07/01/2012  . Unspecified deficiency anemia   . OSTEOPOROSIS 06/11/2010    Multiple compression fractures; and spontaneous fracture of sternum Qualifier: Diagnosis of  By: [name], [name]     . 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 Cord compression 07/13/12    MRI- diffuse myeloma involvement of T-L spine  . History of radiation therapy 07/13/12-07/27/12    spinal cord compression T3-T10,left scapula  . CAD (coronary artery disease)     a. inf STEMI (in [place] >>> [facility]) >>> LHC (8/15- [place]):  pRCA 60%, mid to dist RCA 50% >>> med rx  . Hx of echocardiogram     Echo (03/02/14-[facility] at [facility]):  Normal LVEF  without significant regional wall motion abnormalities. Mild     Medications:  Anti-infectives    Start     Dose/Rate Route Frequency Ordered Stop   09/15/14 0000  vancomycin (VANCOCIN) 500 mg in sodium chloride 0.9 % 100 mL IVPB     500 mg 100 mL/hr over 60 Minutes Intravenous 2 times daily 09/14/14 2353     09/15/14 0000  aztreonam (AZACTAM) 2 g in dextrose 5 % 50 mL IVPB     2 g 100 mL/hr over 30 Minutes Intravenous 3 times per day 09/14/14 2353       Assessment: Pt with Neutropenic fever, HCAP and sepsis.    Goal of Therapy:  Vancomycin trough level 15-20 mcg/ml  Aztreonam dosed based on patient weight and renal function   Plan:  Measure antibiotic drug levels at steady state Follow up culture results Vancomycin 518m iv q12hr  Aztreonam 2gm iv q8hr  [name] 09/15/2014,12:20 AM

## 2014-09-15 NOTE — Progress Notes (Addendum)
CRITICAL VALUE ALERT  Critical value received:  Lactic Acid 2.2  Date of notification:  09/15/14  Time of notification:  1500  Critical value read back: yes  Nurse who received alert:  Marvis Moeller RN   MD notified (1st page):  Critical Care  Time of first page:   MD notified (2nd page):  Time of second page:  Responding MD:    Time MD responded: 1510

## 2014-09-16 ENCOUNTER — Ambulatory Visit: Payer: Medicare Other

## 2014-09-16 ENCOUNTER — Inpatient Hospital Stay (HOSPITAL_COMMUNITY): Payer: Medicare Other

## 2014-09-16 DIAGNOSIS — R6520 Severe sepsis without septic shock: Secondary | ICD-10-CM

## 2014-09-16 DIAGNOSIS — A419 Sepsis, unspecified organism: Secondary | ICD-10-CM | POA: Insufficient documentation

## 2014-09-16 DIAGNOSIS — IMO0001 Reserved for inherently not codable concepts without codable children: Secondary | ICD-10-CM | POA: Insufficient documentation

## 2014-09-16 LAB — BASIC METABOLIC PANEL
Anion gap: 3 — ABNORMAL LOW (ref 5–15)
BUN: 12 mg/dL (ref 6–23)
CO2: 18 mmol/L — ABNORMAL LOW (ref 19–32)
Calcium: 7.5 mg/dL — ABNORMAL LOW (ref 8.4–10.5)
Chloride: 115 mmol/L — ABNORMAL HIGH (ref 96–112)
Creatinine, Ser: 0.73 mg/dL (ref 0.50–1.10)
GFR calc non Af Amer: 83 mL/min — ABNORMAL LOW (ref >90)
Glucose, Bld: 109 mg/dL — ABNORMAL HIGH (ref 70–99)
POTASSIUM: 3.1 mmol/L — AB (ref 3.5–5.1)
Sodium: 136 mmol/L (ref 135–145)

## 2014-09-16 LAB — URINE CULTURE
Colony Count: NO GROWTH
Culture: NO GROWTH

## 2014-09-16 LAB — LACTIC ACID, PLASMA: Lactic Acid, Venous: 1.5 mmol/L (ref 0.5–2.0)

## 2014-09-16 LAB — GLUCOSE, CAPILLARY
GLUCOSE-CAPILLARY: 101 mg/dL — AB (ref 70–99)
GLUCOSE-CAPILLARY: 118 mg/dL — AB (ref 70–99)
GLUCOSE-CAPILLARY: 142 mg/dL — AB (ref 70–99)
Glucose-Capillary: 135 mg/dL — ABNORMAL HIGH (ref 70–99)

## 2014-09-16 LAB — CBC
HEMATOCRIT: 24.5 % — AB (ref 36.0–46.0)
HEMOGLOBIN: 8.1 g/dL — AB (ref 12.0–15.0)
MCH: 34.5 pg — ABNORMAL HIGH (ref 26.0–34.0)
MCHC: 33.1 g/dL (ref 30.0–36.0)
MCV: 104.3 fL — AB (ref 78.0–100.0)
Platelets: 138 10*3/uL — ABNORMAL LOW (ref 150–400)
RBC: 2.35 MIL/uL — ABNORMAL LOW (ref 3.87–5.11)
RDW: 17.5 % — AB (ref 11.5–15.5)
WBC: 6.1 10*3/uL (ref 4.0–10.5)

## 2014-09-16 LAB — CORTISOL: Cortisol, Plasma: 23 ug/dL

## 2014-09-16 MED ORDER — POTASSIUM CHLORIDE 10 MEQ/100ML IV SOLN
10.0000 meq | INTRAVENOUS | Status: AC
  Administered 2014-09-16 (×3): 10 meq via INTRAVENOUS
  Filled 2014-09-16 (×3): qty 100

## 2014-09-16 MED ORDER — VANCOMYCIN HCL 500 MG IV SOLR
500.0000 mg | Freq: Two times a day (BID) | INTRAVENOUS | Status: DC
  Administered 2014-09-16 – 2014-09-17 (×4): 500 mg via INTRAVENOUS
  Filled 2014-09-16 (×6): qty 500

## 2014-09-16 MED ORDER — FUROSEMIDE 10 MG/ML IJ SOLN
40.0000 mg | Freq: Once | INTRAMUSCULAR | Status: AC
  Administered 2014-09-16: 40 mg via INTRAVENOUS
  Filled 2014-09-16: qty 4

## 2014-09-16 NOTE — Progress Notes (Signed)
PULMONARY / CRITICAL CARE MEDICINE   Name: Shannon Rush MRN: [id number] DOB: 01-16-1941    ADMISSION DATE:  09/14/2014 CONSULTATION DATE:  09/15/2014  REFERRING MD :  Charlies Silvers   CHIEF COMPLAINT:  sepsis  INITIAL PRESENTATION:   74 year old female with PMH multiple myeloma (s/p BMT 02/2013, XRT 12/13-1/14, currently on Revlimid) presented to ED on 3/2 with cough, increasing SOB, and fever. Seen at [facility] where CXR reportedly showed PNA and patient was given Vancomycin & ceftriaxone, and DC'd home with arrangements to receive additional IV antibiotics at [facility] oncology infusion center Patient presented to ED after increasing symptoms where she was neutropenic, hypotensive, and hypoxic on RA. PCCM asked to see patient for sepsis managment  STUDIE 3/2 CXR - RLL airspace opacity   SIGNIFICANT EVENTS:   SUBJECTIVE:  no distress  VITAL SIGNS: Temp:  [97.5 F (36.4 C)-98.3 F (36.8 C)] 98.1 F (36.7 C) (03/04 0800) Pulse Rate:  [82-117] 101 (03/04 0908) Resp:  [17-31] 30 (03/04 0908) BP: (76-176)/(38-76) 176/76 mmHg (03/04 0900) SpO2:  [79 %-100 %] 91 % (03/04 0908) FiO2 (%):  [55 %] 55 % (03/03 1535) Weight:  [61.4 kg (135 lb 5.8 oz)] 61.4 kg (135 lb 5.8 oz) (03/04 0400) 2 liters HEMODYNAMICS:   VENTILATOR SETTINGS: Vent Mode:  [-]  FiO2 (%):  [55 %] 55 % INTAKE / OUTPUT:  Intake/Output Summary (Last 24 hours) at 09/16/14 0915 Last data filed at 09/16/14 0907  Gross per 24 hour  Intake 3899.58 ml  Output   2700 ml  Net 1199.58 ml    PHYSICAL EXAMINATION: General:  Ill-appearing female in no acute distress.  Neuro:  Alert, oriented x 4. No focal deficits.  HEENT:  Mucous membranes moist. No JVD.  Cardiovascular:  RRR, no MGR.  Lungs:  Clear with exception of crackles and wheeze right LL Abdomen:  Soft, nontender. Bowel sounds present.  Musculoskeletal:  BLE 2+ pitting edema.  Skin:  Grossly intact.   LABS:  CBC  Recent Labs Lab 09/14/14 1841 09/15/14 0320  09/16/14 0340  WBC 0.6* 3.6* 6.1  HGB 9.8* 8.2* 8.1*  HCT 29.4* 24.5* 24.5*  PLT 191 148* 138*   Coag's  Recent Labs Lab 09/15/14 0002  APTT 34  INR 1.16   BMET  Recent Labs Lab 09/14/14 1841 09/15/14 0320 09/16/14 0340  NA 132* 134* 136  K 3.0* 3.3* 3.1*  CL 103 110 115*  CO2 19 18* 18*  BUN _0 CREATININE 0.95 0.85 0.73  GLUCOSE 177* 118* 109*   Electrolytes  Recent Labs Lab 09/14/14 1841 09/15/14 0320 09/16/14 0340  CALCIUM 8.7 7.1* 7.5*  MG  --  1.3*  --   PHOS  --  2.7  --    Sepsis Markers  Recent Labs Lab 09/15/14 0002 09/15/14 0324 09/15/14 1327 09/16/14 0340  LATICACIDVEN 2.0 2.3* 2.2* 1.5  PROCALCITON 32.98  --   --   --    ABG No results for input(s): PHART, PCO2ART, PO2ART in the last 168 hours. Liver Enzymes  Recent Labs Lab 09/14/14 1841 09/15/14 0320  AST 147* 91*  ALT 71* 63*  ALKPHOS 294* 185*  BILITOT 1.2 0.7  ALBUMIN 3.3* 2.4*   Cardiac Enzymes No results for input(s): TROPONINI, PROBNP in the last 168 hours. Glucose  Recent Labs Lab 09/15/14 1555 09/15/14 1952 09/15/14 2349 09/16/14 0441 09/16/14 0809  GLUCAP 123* 135* 135* 101* 118*    Imaging No results found.   ASSESSMENT /  PLAN:   Severe sepsis r/t HCAP -->now hemodynamically stable  Worsening bilateral infiltrates-->likely volume related, but at risk for ALI Anemia Neutropenia  H/o multiple myeloma  Hypokalemia Hypomagnesemia Hypocalcemia Metabolic acidosis + NAG-->likely d/t chlorine load from NS boluses  Discussion Looks better. No distress but she is a little more short of breath. Suspect that this is primarily volume excess from resuscitation efforts. Have discussed this w/ primary team. Agree 1 X lasix very reasonable. We will s/o.  Plan/rec:    Aztreonam 3/2 >> Vancomycin 3/2 >> Replace K Will need to decide on out-pt regimen. Might need PICC for IV admin as her allergy profile makes ABX choice challenging.      TODAY'S  SUMMARY:  74 year old female with PMH multiple myeloma (s/p BMT 02/2013, XRT 12/13-1/14, currently on Revlimid) presented to ED with febrile neutropenia and severe sepsis r/t HCAP. Volume resuscitated now hemodynamically stable. Her CXR reflects either mild overload from resuscitation efforts versus ALI. Agree w/ lasix. We will s/o.      Erick Colace ACNP-BC [place] Pager # [phone] OR # [phone] if no answer 09/16/2014, 9:15 AM   Attending Note:  I have examined patient, reviewed labs, studies and notes. I have discussed the case with Jerrye Bushy, and I agree with the data and plans as amended above. She has responded well to IVF, BP now at goal. Will need to consider outpatient abx regimen. ? Whether ID consultation would help. Please call if PCCM can help further.   Baltazar Apo, MD, PhD 09/16/2014, 10:26 AM [facility] [phone] or if no answer [phone]

## 2014-09-16 NOTE — Progress Notes (Signed)
Patient ID: Shannon Rush, female   DOB: 07/02/1941, 74 y.o.   MRN: [id number] [facility] PROGRESS NOTE  Shannon Rush HYQ:[id number] DOB: 1941/02/22 DOA: 09/14/2014 PCP: [name], MD  Brief narrative:    74 y.o. female with past medical history of multiple myeloma, has had a bone marrow transplant 03/03/2013, follows at [facility], patient currently on Revlimid, further history of cord compression in 2013, history of radiation therapy (07/13/12-07/27/12). Patient was recently seen in [facility], reported she had chest x-ray done and was diagnosed with pneumonia. She was given vancomycin and Rocephin and then subsequently discharged home. Patient rapidly decompensated since then.  She also reported ongoing cough, fatigue and weakness.  In ED, patient was found to be hypotensive with blood pressure of 82/47, heart rate 144, respiratory rate 32, oxygen saturation 85% on nasal cannula oxygen support, T max 102.35F. Blood work revealed severe neutropenia, white blood cell count 0.6, hemoglobin 9.8, normal platelet count. Sodium was 132 and potassium 3.0. Chest x-ray showed worsening right lower lung airspace opacities likely due to pneumonia. Patient was started on broad-spectrum antibiotics for treatment of sepsis. She was started on aztreonam and vancomycin. Critical care medicine consulted for help with sepsis management.   Assessment/Plan:    Principal problem:  Acute respiratory failure with hypoxia / healthcare associated pneumonia - Patient was hypoxic on the admission with oxygen saturation of 85% on room air. Chest x-ray on the admission showed right lower lung opacities likely due to pneumonia. - This morning patient appears to be more short of breath which is likely secondary to volume overload from volume resuscitation on the admission. Chest x-ray this morning shows persistent infiltrate and development of interstitial density consistent with edema, fluid overload. -  We'll give 1 dose of Lasix IV 40 mg. - Continue treatment for healthcare associated pneumonia, vancomycin and aztreonam. - Legionella and strep pneumonia are negative. - Critical care medicine has seen the patient in consultation. No further recommendations other than giving Lasix today for fluid overload. They signed off. - Patient will remain in step down unit for next 24 hours due to shortness of breath.  Active Problems: Severe sepsis secondary to healthcare associated pneumonia - Sepsis criteria met on the admission with hypotension, tachycardia, tachypnea, fever, hypoxia. In addition patient neutropenic with white blood cell count 0.6 (neutrophils 0.5), lactic acid 4.57. Procalcitonin level 32.98.Chest x-ray with evidence of pneumonia in right lower lung lobe. - Patient was started on vancomycin and aztreonam on the admission. Blood cultures to date are negative. Legionella and strep pneumonia are negative. Urine culture shows no growth. - Blood pressure normalized. Patient did not require pressor support.  Febrile neutropenia - Likely sequela of chemotherapy, Revlimid. - White blood cell count 0.6 on the admission. Subsequently improved to 3.6 and then 6.1. She has received filgrastim one dose in ED. - No fevers since the admission.  History of multiple myeloma - Currently on Revlimid which is put on hold because of acute infection, sepsis  Anemia of chronic disease - Secondary to history of multiple myeloma and chemotherapy - Hemoglobin is stable at 8.1. - No indication for transfusion.  Hypokalemia - Likely secondary to sepsis.  - Potassium being supplemented.  History of coronary artery disease - Continue Plavix   DVT Prophylaxis  -  Lovenox subcutaneous ordered while patient is in hospital.   Code Status: Full.  Family Communication:  plan of care discussed with the patient Disposition: in SDU since more short of breath this morning.  IV access:  Peripheral  IV  Procedures and diagnostic studies:    Dg Chest Port 1 View September 24, 2014   Worsened right lower lung zone airspace disease likely due to pneumonia. Recommend followup films to clearing.     Dg Chest Port 1 View 09/16/2014    Persistent infiltrate and volume loss in both lung bases.  Development of interstitial density consistent with edema/ fluid overload.  Small effusions right more than left.  Increased superior mediastinal density raising the possibility of lymphadenopathy.     Medical Consultants:  CCM  Other Consultants:  None   IAnti-Infectives:   Vanco 2014/09/24 --> Aztreonam 09/24/14 -->    Leisa Lenz, MD  [facility] Pager [phone]  If 7PM-7AM, please contact night-coverage [email or web address] Password [email or web address] 09/16/2014, 10:40 AM   LOS: 2 days    HPI/Subjective: No acute overnight events.  Objective: Filed Vitals:   09/16/14 0800 09/16/14 0900 09/16/14 0908 09/16/14 0934  BP:  176/76  131/66  Pulse:  117 101 103  Temp: 98.1 F (36.7 C)     TempSrc: Oral     Resp:  22 30 20   Height:      Weight:      SpO2:  84% 91% 94%    Intake/Output Summary (Last 24 hours) at 09/16/14 1040 Last data filed at 09/16/14 1005  Gross per 24 hour  Intake 3982.24 ml  Output   4400 ml  Net -417.76 ml    Exam:   General:  Pt is alert, no distress  Cardiovascular: Regular rate and rhythm, S1/S2 appreciated  Respiratory: Sounds congested, wheezing in upper lung lobes, rales (+)  Abdomen: Soft, non tender, non distended, bowel sounds present  Extremities: No edema, pulses  palpable bilaterally  Neuro: No focal neurological deficits  Data Reviewed: Basic Metabolic Panel:  Recent Labs Lab 09-24-2014 1841 09/15/14 0320 09/16/14 0340  NA 132* 134* 136  K 3.0* 3.3* 3.1*  CL 103 110 115*  CO2 19 18* 18*  GLUCOSE 177* 118* 109*  BUN 15 11 12   CREATININE 0.95 0.85 0.73  CALCIUM 8.7 7.1* 7.5*  MG  --  1.3*  --   PHOS  --  2.7  --    Liver Function  Tests:  Recent Labs Lab 09/24/2014 1841 09/15/14 0320  AST 147* 91*  ALT 71* 63*  ALKPHOS 294* 185*  BILITOT 1.2 0.7  PROT 7.5 5.7*  ALBUMIN 3.3* 2.4*   No results for input(s): LIPASE, AMYLASE in the last 168 hours. No results for input(s): AMMONIA in the last 168 hours. CBC:  Recent Labs Lab 2014/09/24 1841 09/15/14 0320 09/16/14 0340  WBC 0.6* 3.6* 6.1  NEUTROABS 0.5* 3.3  --   HGB 9.8* 8.2* 8.1*  HCT 29.4* 24.5* 24.5*  MCV 104.3* 102.9* 104.3*  PLT 191 148* 138*   Cardiac Enzymes: No results for input(s): CKTOTAL, CKMB, CKMBINDEX, TROPONINI in the last 168 hours. BNP: Invalid input(s): POCBNP CBG:  Recent Labs Lab 09/15/14 1555 09/15/14 1952 09/15/14 2349 09/16/14 0441 09/16/14 0809  GLUCAP 123* 135* 135* 101* 118*    Recent Results (from the past 240 hour(s))  Blood Culture (routine x 2)     Status: None (Preliminary result)   Collection Time: 09-24-14  6:33 PM  Result Value Ref Range Status   Specimen Description BLOOD LEFT WRIST  Final   Special Requests BOTTLES DRAWN AEROBIC AND ANAEROBIC [facility] EACH  Final   Culture   Final  BLOOD CULTURE RECEIVED NO GROWTH TO DATE CULTURE WILL BE HELD FOR 5 DAYS BEFORE ISSUING A FINAL NEGATIVE REPORT Performed at [facility]    Report Status PENDING  Incomplete  Blood Culture (routine x 2)     Status: None (Preliminary result)   Collection Time: 09/14/14  6:40 PM  Result Value Ref Range Status   Specimen Description BLOOD RIGHT ANTECUBITAL  Final   Special Requests BOTTLES DRAWN AEROBIC AND ANAEROBIC 5CC EACH  Final   Culture   Final           BLOOD CULTURE RECEIVED NO GROWTH TO DATE CULTURE WILL BE HELD FOR 5 DAYS BEFORE ISSUING A FINAL NEGATIVE REPORT Performed at [facility]    Report Status PENDING  Incomplete  Urine culture     Status: None   Collection Time: 09/14/14  9:00 PM  Result Value Ref Range Status   Specimen Description URINE, CLEAN CATCH  Final   Special Requests NONE   Final   Colony Count NO GROWTH Performed at [facility]   Final   Culture NO GROWTH Performed at [facility]   Final   Report Status 09/16/2014 FINAL  Final     Scheduled Meds: . aztreonam  2 g Intravenous 3 times per day  . clopidogrel  75 mg Oral Daily  . enoxaparin (LOVENOX) injection  40 mg Subcutaneous QHS  . feeding supplement (RESOURCE BREEZE)  1 Container Oral BID BM  . pantoprazole  40 mg Oral Daily  . potassium chloride  10 mEq Intravenous Q1 Hr x 3  . vancomycin  500 mg Intravenous BID   Continuous Infusions: . dextrose 5 % and 0.45% NaCl 10 mL/hr at 09/16/14 0063        Is is interval

## 2014-09-17 ENCOUNTER — Ambulatory Visit: Payer: Medicare Other

## 2014-09-17 DIAGNOSIS — D61818 Other pancytopenia: Secondary | ICD-10-CM

## 2014-09-17 LAB — BASIC METABOLIC PANEL
ANION GAP: 4 — AB (ref 5–15)
BUN: 11 mg/dL (ref 6–23)
CO2: 26 mmol/L (ref 19–32)
Calcium: 7.5 mg/dL — ABNORMAL LOW (ref 8.4–10.5)
Chloride: 106 mmol/L (ref 96–112)
Creatinine, Ser: 0.71 mg/dL (ref 0.50–1.10)
GFR calc non Af Amer: 84 mL/min — ABNORMAL LOW (ref >90)
Glucose, Bld: 99 mg/dL (ref 70–99)
Potassium: 3.2 mmol/L — ABNORMAL LOW (ref 3.5–5.1)
Sodium: 136 mmol/L (ref 135–145)

## 2014-09-17 LAB — CBC
HEMATOCRIT: 24.2 % — AB (ref 36.0–46.0)
HEMOGLOBIN: 8.2 g/dL — AB (ref 12.0–15.0)
MCH: 34.7 pg — AB (ref 26.0–34.0)
MCHC: 33.9 g/dL (ref 30.0–36.0)
MCV: 102.5 fL — ABNORMAL HIGH (ref 78.0–100.0)
Platelets: 140 10*3/uL — ABNORMAL LOW (ref 150–400)
RBC: 2.36 MIL/uL — ABNORMAL LOW (ref 3.87–5.11)
RDW: 17.1 % — ABNORMAL HIGH (ref 11.5–15.5)
WBC: 5 10*3/uL (ref 4.0–10.5)

## 2014-09-17 LAB — VANCOMYCIN, TROUGH: Vancomycin Tr: 10.4 ug/mL (ref 10.0–20.0)

## 2014-09-17 MED ORDER — LIP MEDEX EX OINT
TOPICAL_OINTMENT | CUTANEOUS | Status: DC | PRN
  Administered 2014-09-17: 21:00:00 via TOPICAL
  Filled 2014-09-17: qty 7

## 2014-09-17 MED ORDER — POTASSIUM CHLORIDE CRYS ER 20 MEQ PO TBCR
40.0000 meq | EXTENDED_RELEASE_TABLET | Freq: Once | ORAL | Status: AC
  Administered 2014-09-17: 40 meq via ORAL
  Filled 2014-09-17: qty 2

## 2014-09-17 NOTE — Progress Notes (Signed)
ANTIBIOTIC CONSULT NOTE - follow up  Pharmacy Consult for vancomycin, aztreonam Indication: Neutropenic fever, HCAP and sepsis  Allergies  Allergen Reactions  . Zosyn [Piperacillin Sod-Tazobactam So] Rash    Itching, rash and swelling of extremities  . Zithromax [Azithromycin]     Full body rash  . Ciprofloxacin Rash    Throwing up and stomach pain     Patient Measurements: Height: 5' 7"  (170.2 cm) Weight: 127 lb 6.8 oz (57.8 kg) IBW/kg (Calculated) : 61.6 Adjusted Body Weight:   Vital Signs: Temp: 98.2 F (36.8 C) (03/05 0300) BP: 95/56 mmHg (03/05 1200) Pulse Rate: 107 (03/05 1200) Intake/Output from previous day: 03/04 0701 - 03/05 0700 In: 1227.2 [P.O.:180; I.V.:397.2; IV Piggyback:650] Out: 5400 [Urine:5400] Intake/Output from this shift: Total I/O In: 942.7 [P.O.:800; I.V.:42.7; IV Piggyback:100] Out: -   Labs:  Recent Labs  09/15/14 0320 09/16/14 0340 09/17/14 0715  WBC 3.6* 6.1 5.0  HGB 8.2* 8.1* 8.2*  PLT 148* 138* 140*  CREATININE 0.85 0.73 0.71   Estimated Creatinine Clearance: 58 mL/min (by C-G formula based on Cr of 0.71). No results for input(s): VANCOTROUGH, VANCOPEAK, VANCORANDOM, GENTTROUGH, GENTPEAK, GENTRANDOM, TOBRATROUGH, TOBRAPEAK, TOBRARND, AMIKACINPEAK, AMIKACINTROU, AMIKACIN in the last 72 hours.   Microbiology: Recent Results (from the past 720 hour(s))  Blood Culture (routine x 2)     Status: None (Preliminary result)   Collection Time: 09/14/14  6:33 PM  Result Value Ref Range Status   Specimen Description BLOOD LEFT WRIST  Final   Special Requests BOTTLES DRAWN AEROBIC AND ANAEROBIC 5CC EACH  Final   Culture   Final           BLOOD CULTURE RECEIVED NO GROWTH TO DATE CULTURE WILL BE HELD FOR 5 DAYS BEFORE ISSUING A FINAL NEGATIVE REPORT Performed at [facility]    Report Status PENDING  Incomplete  Blood Culture (routine x 2)     Status: None (Preliminary result)   Collection Time: 09/14/14  6:40 PM  Result Value Ref  Range Status   Specimen Description BLOOD RIGHT ANTECUBITAL  Final   Special Requests BOTTLES DRAWN AEROBIC AND ANAEROBIC 5CC EACH  Final   Culture   Final           BLOOD CULTURE RECEIVED NO GROWTH TO DATE CULTURE WILL BE HELD FOR 5 DAYS BEFORE ISSUING A FINAL NEGATIVE REPORT Performed at [facility]    Report Status PENDING  Incomplete  Urine culture     Status: None   Collection Time: 09/14/14  9:00 PM  Result Value Ref Range Status   Specimen Description URINE, CLEAN CATCH  Final   Special Requests NONE  Final   Colony Count NO GROWTH Performed at [facility]   Final   Culture NO GROWTH Performed at [facility]   Final   Report Status 09/16/2014 FINAL  Final    Medical History: Past Medical History  Diagnosis Date  . Multiple myeloma 07/01/2012  . Unspecified deficiency anemia   . OSTEOPOROSIS 06/11/2010    Multiple compression fractures; and spontaneous fracture of sternum Qualifier: Diagnosis of  By: [name], [name]     . 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 Cord compression 07/13/12    MRI- diffuse myeloma involvement of T-L spine  . History of radiation therapy 07/13/12-07/27/12    spinal cord compression T3-T10,left scapula  . CAD (coronary artery disease)     a. inf STEMI (in [place] >>> [facility]) >>>  LHC (8/15- [place]):  pRCA 60%, mid to dist RCA 50% >>> med rx  . Hx of echocardiogram     Echo (03/02/14-done at [facility]):  Normal LVEF without significant regional wall motion abnormalities. Mild     Medications:  Anti-infectives    Start     Dose/Rate Route Frequency Ordered Stop   09/16/14 1000  vancomycin (VANCOCIN) 500 mg in sodium chloride 0.9 % 100 mL IVPB     500 mg 50 mL/hr over 120 Minutes Intravenous 2 times daily 09/16/14 0607     09/15/14 0000  vancomycin (VANCOCIN) 500 mg in sodium chloride 0.9 % 100 mL IVPB  Status:  Discontinued     500 mg 100 mL/hr over 60  Minutes Intravenous 2 times daily 09/14/14 2353 09/16/14 0607   09/15/14 0000  aztreonam (AZACTAM) 2 g in dextrose 5 % 50 mL IVPB     2 g 100 mL/hr over 30 Minutes Intravenous 3 times per day 09/14/14 2353       Assessment: HCAP/sepsis/neutropenic fever with MM currently on Revlimid PTA --She was seen at [facility] had a CXR done and was diagnosed with PNA she was given Vancomycin and ceftriaxone (hospitalized 1/25-1/30 with strep pneumoniae antigen +, RSV B +). She was discharged to home at that time her WBC was 6. Patient rapidly decompensated became febrile with worsening shortness of breath and presented to [facility] ER. She was found to be febrile up to 102.3. revlimid PTA Per EDP, MM patient received vancomycin and rocephin ~1:30PM at [facility] today for pneumonia and was discharged.  Presents to our ED with allergic reaction, possibly cross-reactivity from cephalosporin and previous zosyn reaction and vancomycin rate has been slowed down as a result for possible Redman's syndrome  3/5: Per RN, rash remains but has not gotten worse with further administration of vanc doses.  PTA: Vanc and Ceftriaxone x1 dose at [facility] 3/2 Inpatient: 09/15/2014 >> vanc >> 09/15/2014 >> aztreo >>  Tmax: 102.3 on 3/2, 100.7 on 3/3, afebrile since WBCs: 5, improving - Revlimid off right now - last dose 2/29 per notes Renal: SCr 0.71, stable. CrCl 61 ml/min Lactic acid: 4.57 > 2 > 2.3 > 2.2 PCT: 32.98  Micro: 3/2 blood x2: sent 3/2 urine: sent 3/2 sputum:  3/3 Legionella antigen urine: negative 3/3 Strep pneumoniae urine: negative  Goal of Therapy:  Vancomycin trough level 15-20 mcg/ml  Aztreonam dosed based on patient weight and renal function   Plan: Day 3 Abx's 1) Continue vancomycin 513m q12 for now 2) Continue current Aztreonam dosing 3) Will check vanc trough prior to this evening's dose and schedule for 9pm   JAdrian Saran PharmD, BCPS Pager [phone]/11/2014 12:23 PM

## 2014-09-17 NOTE — Progress Notes (Signed)
Pt arrived in room 1515 at 1800.  Vitals signs wnl.  Denies any cp.  No signs of resp distress.  Tele applied with cont pulse ox.  Irven Baltimore, RN

## 2014-09-17 NOTE — Progress Notes (Signed)
Patient ID: CADEE AGRO, female   DOB: [birth year], 74 y.o.   MRN: [id number] [facility] PROGRESS NOTE  CAMREIGH MICHIE FYB:[id number] DOB: 12/06/1940 DOA: 2014-10-06 PCP: [name], MD  Brief narrative:    74 y.o. female with past medical history of multiple myeloma, has had a bone marrow transplant 03/03/2013, follows at [facility], patient currently on Revlimid, further history of cord compression in 2013, history of radiation therapy (07/13/12-07/27/12). Patient was recently seen in [facility], reported she had chest x-ray done and was diagnosed with pneumonia. She was given vancomycin and Rocephin and then subsequently discharged home. Patient rapidly decompensated since then.  She also reported ongoing cough, fatigue and weakness.  In ED, patient was found to be hypotensive with blood pressure of 82/47, heart rate 144, respiratory rate 32, oxygen saturation 85% on nasal cannula oxygen support, T max 102.29F. Blood work revealed severe neutropenia, white blood cell count 0.6, hemoglobin 9.8, normal platelet count. Sodium was 132 and potassium 3.0. Chest x-ray showed worsening right lower lung airspace opacities likely due to pneumonia. Patient was started on broad-spectrum antibiotics for treatment of sepsis. She was started on aztreonam and vancomycin. Critical care medicine consulted for help with sepsis management.   Assessment/Plan:    Principal problem:  Acute respiratory failure with hypoxia / healthcare associated pneumonia - Patient was hypoxic on the admission with oxygen saturation of 85% on room air. Chest x-ray on the admission showed right lower lung opacities likely due to pneumonia. -Repeat chest x-ray on 09/16/2014 showing persistent infiltrate -Showing clinical improvement, continue aztreonam and vancomycin   Active Problems: Severe sepsis secondary to healthcare associated pneumonia - Sepsis criteria met on the admission with hypotension, tachycardia,  tachypnea, fever, hypoxia. In addition patient neutropenic with white blood cell count 0.6 (neutrophils 0.5), lactic acid 4.57. Procalcitonin level 32.98.Chest x-ray with evidence of pneumonia in right lower lung lobe. -Showing clinical improvement, blood pressure stable, having less blood pressure 120/73.  Febrile neutropenia - Likely sequela of chemotherapy, Revlimid - White blood cell count 0.6 on the admission. CBC on 09/17/2014 showing white count of 5.0. - Patient started on broad-spectrum antibiotics. Febrile on the admission but no fevers since then. - Blood cultures drawn on 10-06-14 showed no growth 2 sets  History of multiple myeloma - Currently on Revlimid which is put on hold because of acute infection, sepsis  Anemia of chronic disease - Secondary to history of multiple myeloma and chemotherapy - Hemoglobin is 9.8. No reports of bleeding. No current indications for transfusion  Hypokalemia - Likely secondary to sepsis. Potassium supplemented.  History of coronary artery disease - Continue Plavix   DVT Prophylaxis  -  Lovenox subcutaneous ordered    Code Status: Full.  Family Communication:  plan of care discussed with the patient Disposition: Given patient's stability will transfer to [place]   IV access:  Peripheral IV  Procedures and diagnostic studies:    Dg Chest Port 1 View 10-06-14   Worsened right lower lung zone airspace disease likely due to pneumonia. Recommend followup films to clearing.      Medical Consultants:  CCM  Other Consultants:  None   IAnti-Infectives:   Vanco 10-06-14 --> Aztreonam 10-06-14 -->   Kelvin Cellar, MD  [facility] Pager [phone]  If 7PM-7AM, please contact night-coverage [email or web address] Password [email or web address] 09/17/2014, 3:23 PM   LOS: 3 days    HPI/Subjective: No acute overnight events.  Objective: Filed Vitals:   09/17/14 0914 09/17/14 1000 09/17/14 1200 09/17/14 1415  BP: 117/72 90/55 95/56  128/73   Pulse: 114 106 107 111  Temp:      TempSrc:      Resp: 21 21 21 24   Height:      Weight:      SpO2: 99% 100% 100% 98%    Intake/Output Summary (Last 24 hours) at 09/17/14 1523 Last data filed at 09/17/14 1100  Gross per 24 hour  Intake 1537.17 ml  Output   1300 ml  Net 237.17 ml    Exam:   General:  Pt is alert, follows commands appropriately, not in acute distress  Cardiovascular: Regular rate and rhythm, S1/S2, no murmurs  Respiratory: Clear to auscultation bilaterally, no wheezing, no crackles, no rhonchi  Abdomen: Soft, non tender, non distended, bowel sounds present  Extremities: No edema, pulses DP and PT palpable bilaterally  Neuro: Grossly nonfocal  Data Reviewed: Basic Metabolic Panel:  Recent Labs Lab 09/14/14 1841 09/15/14 0320 09/16/14 0340 09/17/14 0715  NA 132* 134* 136 136  K 3.0* 3.3* 3.1* 3.2*  CL 103 110 115* 106  CO2 19 18* 18* 26  GLUCOSE 177* 118* 109* 99  BUN 15 11 12 11   CREATININE 0.95 0.85 0.73 0.71  CALCIUM 8.7 7.1* 7.5* 7.5*  MG  --  1.3*  --   --   PHOS  --  2.7  --   --    Liver Function Tests:  Recent Labs Lab 09/14/14 1841 09/15/14 0320  AST 147* 91*  ALT 71* 63*  ALKPHOS 294* 185*  BILITOT 1.2 0.7  PROT 7.5 5.7*  ALBUMIN 3.3* 2.4*   No results for input(s): LIPASE, AMYLASE in the last 168 hours. No results for input(s): AMMONIA in the last 168 hours. CBC:  Recent Labs Lab 09/14/14 1841 09/15/14 0320 09/16/14 0340 09/17/14 0715  WBC 0.6* 3.6* 6.1 5.0  NEUTROABS 0.5* 3.3  --   --   HGB 9.8* 8.2* 8.1* 8.2*  HCT 29.4* 24.5* 24.5* 24.2*  MCV 104.3* 102.9* 104.3* 102.5*  PLT 191 148* 138* 140*   Cardiac Enzymes: No results for input(s): CKTOTAL, CKMB, CKMBINDEX, TROPONINI in the last 168 hours. BNP: Invalid input(s): POCBNP CBG:  Recent Labs Lab 09/15/14 1952 09/15/14 2349 09/16/14 0441 09/16/14 0809 09/16/14 1148  GLUCAP 135* 135* 101* 118* 142*    Recent Results (from the past 240 hour(s))   Blood Culture (routine x 2)     Status: None (Preliminary result)   Collection Time: 09/14/14  6:33 PM  Result Value Ref Range Status   Specimen Description BLOOD LEFT WRIST  Final   Special Requests BOTTLES DRAWN AEROBIC AND ANAEROBIC 5CC EACH  Final   Culture   Final           BLOOD CULTURE RECEIVED NO GROWTH TO DATE CULTURE WILL BE HELD FOR 5 DAYS BEFORE ISSUING A FINAL NEGATIVE REPORT Performed at [facility]    Report Status PENDING  Incomplete  Blood Culture (routine x 2)     Status: None (Preliminary result)   Collection Time: 09/14/14  6:40 PM  Result Value Ref Range Status   Specimen Description BLOOD RIGHT ANTECUBITAL  Final   Special Requests BOTTLES DRAWN AEROBIC AND ANAEROBIC 5CC EACH  Final   Culture   Final           BLOOD CULTURE RECEIVED NO GROWTH TO DATE CULTURE WILL BE HELD FOR 5 DAYS BEFORE ISSUING A FINAL NEGATIVE REPORT Performed at [facility]    Report Status PENDING  Incomplete  Urine culture     Status: None   Collection Time: 09/14/14  9:00 PM  Result Value Ref Range Status   Specimen Description URINE, CLEAN CATCH  Final   Special Requests NONE  Final   Colony Count NO GROWTH Performed at [facility]   Final   Culture NO GROWTH Performed at [facility]   Final   Report Status 09/16/2014 FINAL  Final     Scheduled Meds: . aztreonam  2 g Intravenous 3 times per day  . clopidogrel  75 mg Oral Daily  . enoxaparin (LOVENOX) injection  40 mg Subcutaneous QHS  . feeding supplement (RESOURCE BREEZE)  1 Container Oral BID BM  . pantoprazole  40 mg Oral Daily  . potassium chloride  40 mEq Oral Once  . vancomycin  500 mg Intravenous BID   Continuous Infusions: . dextrose 5 % and 0.45% NaCl 10 mL/hr at 09/17/14 0700

## 2014-09-18 LAB — BASIC METABOLIC PANEL
ANION GAP: 7 (ref 5–15)
BUN: 11 mg/dL (ref 6–23)
CO2: 25 mmol/L (ref 19–32)
CREATININE: 0.58 mg/dL (ref 0.50–1.10)
Calcium: 7.8 mg/dL — ABNORMAL LOW (ref 8.4–10.5)
Chloride: 106 mmol/L (ref 96–112)
GFR calc non Af Amer: 90 mL/min — ABNORMAL LOW (ref >90)
Glucose, Bld: 89 mg/dL (ref 70–99)
Potassium: 3.3 mmol/L — ABNORMAL LOW (ref 3.5–5.1)
Sodium: 138 mmol/L (ref 135–145)

## 2014-09-18 LAB — CBC
HCT: 22.5 % — ABNORMAL LOW (ref 36.0–46.0)
Hemoglobin: 7.6 g/dL — ABNORMAL LOW (ref 12.0–15.0)
MCH: 34.7 pg — ABNORMAL HIGH (ref 26.0–34.0)
MCHC: 33.8 g/dL (ref 30.0–36.0)
MCV: 102.7 fL — ABNORMAL HIGH (ref 78.0–100.0)
Platelets: 126 10*3/uL — ABNORMAL LOW (ref 150–400)
RBC: 2.19 MIL/uL — ABNORMAL LOW (ref 3.87–5.11)
RDW: 17.1 % — AB (ref 11.5–15.5)
WBC: 2.7 10*3/uL — AB (ref 4.0–10.5)

## 2014-09-18 MED ORDER — DOXYCYCLINE HYCLATE 100 MG PO TABS
100.0000 mg | ORAL_TABLET | Freq: Two times a day (BID) | ORAL | Status: DC
Start: 2014-09-18 — End: 2014-09-19
  Administered 2014-09-18 – 2014-09-19 (×3): 100 mg via ORAL
  Filled 2014-09-18 (×4): qty 1

## 2014-09-18 MED ORDER — VANCOMYCIN HCL IN DEXTROSE 750-5 MG/150ML-% IV SOLN
750.0000 mg | Freq: Two times a day (BID) | INTRAVENOUS | Status: DC
  Administered 2014-09-18: 750 mg via INTRAVENOUS
  Filled 2014-09-18: qty 150

## 2014-09-18 MED ORDER — POTASSIUM CHLORIDE CRYS ER 20 MEQ PO TBCR
40.0000 meq | EXTENDED_RELEASE_TABLET | Freq: Four times a day (QID) | ORAL | Status: AC
  Administered 2014-09-18 (×2): 40 meq via ORAL
  Filled 2014-09-18 (×2): qty 2

## 2014-09-18 NOTE — Progress Notes (Signed)
Patient ID: Shannon Rush, female   DOB: [birth year], 74 y.o.   MRN: [id number] [facility] PROGRESS NOTE  Shannon Rush WNI:[id number] DOB: 12/07/1940 DOA: Sep 27, 2014 PCP: [name], MD  Brief narrative:    74 y.o. female with past medical history of multiple myeloma, has had a bone marrow transplant 03/03/2013, follows at [facility], patient currently on Revlimid, further history of cord compression in 2013, history of radiation therapy (07/13/12-07/27/12). Patient was recently seen in [facility], reported she had chest x-ray done and was diagnosed with pneumonia. She was given vancomycin and Rocephin and then subsequently discharged home. Patient rapidly decompensated since then.  She also reported ongoing cough, fatigue and weakness.  In ED, patient was found to be hypotensive with blood pressure of 82/47, heart rate 144, respiratory rate 32, oxygen saturation 85% on nasal cannula oxygen support, T max 102.25F. Blood work revealed severe neutropenia, white blood cell count 0.6, hemoglobin 9.8, normal platelet count. Sodium was 132 and potassium 3.0. Chest x-ray showed worsening right lower lung airspace opacities likely due to pneumonia. Patient was started on broad-spectrum antibiotics for treatment of sepsis. She was started on aztreonam and vancomycin. Critical care medicine consulted for help with sepsis management.   Assessment/Plan:    Principal problem:  Acute respiratory failure with hypoxia / healthcare associated pneumonia - Patient was hypoxic on the admission with oxygen saturation of 85% on room air. Chest x-ray on the admission showed right lower lung opacities likely due to pneumonia. -Repeat chest x-ray on 09/16/2014 showing persistent infiltrate -Showing clinical improvement, as she is presently satting mid 90s on 2 L supplemental oxygen -Will discontinue aztreonam and vancomycin. Patient having multiple drug allergies including allergies to Zosyn and  ciprofloxacin. -Will transition to doxycycline 100 mg by mouth twice a day   Active Problems: Severe sepsis secondary to healthcare associated pneumonia - Sepsis criteria met on the admission with hypotension, tachycardia, tachypnea, fever, hypoxia. In addition patient neutropenic with white blood cell count 0.6 (neutrophils 0.5), lactic acid 4.57. Procalcitonin level 32.98.Chest x-ray with evidence of pneumonia in right lower lung lobe. -Patient receiving 4 days of broad-spectrum IV antimicrobial therapy with IV vancomycin and aztreonam. Will transition to oral doxycycline today  Febrile neutropenia - Likely sequela of chemotherapy, Revlimid - White blood cell count 0.6 on the admission. CBC on 09/17/2014 showing white count of 5.0. - Patient started on broad-spectrum antibiotics. Febrile on the admission but no fevers since then. - Blood cultures drawn on 27-Sep-2014 showed no growth 2 sets  History of multiple myeloma - Currently on Revlimid which is put on hold because of acute infection, sepsis  Anemia of chronic disease - Secondary to history of multiple myeloma and chemotherapy - Hemoglobin is 9.8. No reports of bleeding. No current indications for transfusion  Hypokalemia - Potassium 3.3 on a.m. lab work, will provide supplementation with [place]  History of coronary artery disease - Continue Plavix   DVT Prophylaxis  -  Lovenox subcutaneous ordered    Code Status: Full.  Family Communication:  plan of care discussed with the patient Disposition: Anticipate discharge in the next 24 hours   IV access:  Peripheral IV  Procedures and diagnostic studies:    Dg Chest Port 1 View 09/27/14   Worsened right lower lung zone airspace disease likely due to pneumonia. Recommend followup films to clearing.      Medical Consultants:  CCM  Other Consultants:  None   IAnti-Infectives:   Vanco 09-27-14 --> Aztreonam 09-27-14 -->  Kelvin Cellar, MD  [facility] Pager [phone]  If 7PM-7AM, please contact night-coverage [email or web address] Password [email or web address] 09/18/2014, 2:22 PM   LOS: 4 days    HPI/Subjective: No acute overnight events. Patient reports doing well, she is sitting out of bed to chair having her lunch. Asking when she can go home.  Objective: Filed Vitals:   09/17/14 2100 09/18/14 0016 09/18/14 0518 09/18/14 0744  BP: 106/65 100/53 92/46 88/56   Pulse: 109 101 87   Temp: 97.8 F (36.6 C) 98.3 F (36.8 C) 98.1 F (36.7 C)   TempSrc: Oral Oral Oral   Resp: 18 20 18    Height:      Weight:      SpO2: 91% 94% 94%     Intake/Output Summary (Last 24 hours) at 09/18/14 1422 Last data filed at 09/17/14 1600  Gross per 24 hour  Intake     70 ml  Output      0 ml  Net     70 ml    Exam:   General:  Pt is alert, follows commands appropriately, not in acute distress  Cardiovascular: Regular rate and rhythm, S1/S2, no murmurs  Respiratory: Clear to auscultation bilaterally, no wheezing, no crackles, no rhonchi  Abdomen: Soft, non tender, non distended, bowel sounds present  Extremities: No edema, pulses DP and PT palpable bilaterally  Neuro: Grossly nonfocal  Data Reviewed: Basic Metabolic Panel:  Recent Labs Lab 09/14/14 1841 09/15/14 0320 09/16/14 0340 09/17/14 0715 09/18/14 0525  NA 132* 134* 136 136 138  K 3.0* 3.3* 3.1* 3.2* 3.3*  CL 103 110 115* 106 106  CO2 19 18* 18* 26 25  GLUCOSE 177* 118* 109* 99 89  BUN 15 11 12 11 11   CREATININE 0.95 0.85 0.73 0.71 0.58  CALCIUM 8.7 7.1* 7.5* 7.5* 7.8*  MG  --  1.3*  --   --   --   PHOS  --  2.7  --   --   --    Liver Function Tests:  Recent Labs Lab 09/14/14 1841 09/15/14 0320  AST 147* 91*  ALT 71* 63*  ALKPHOS 294* 185*  BILITOT 1.2 0.7  PROT 7.5 5.7*  ALBUMIN 3.3* 2.4*   No results for input(s): LIPASE, AMYLASE in the last 168 hours. No results for input(s): AMMONIA in the last 168 hours. CBC:  Recent Labs Lab 09/14/14 1841  09/15/14 0320 09/16/14 0340 09/17/14 0715 09/18/14 0525  WBC 0.6* 3.6* 6.1 5.0 2.7*  NEUTROABS 0.5* 3.3  --   --   --   HGB 9.8* 8.2* 8.1* 8.2* 7.6*  HCT 29.4* 24.5* 24.5* 24.2* 22.5*  MCV 104.3* 102.9* 104.3* 102.5* 102.7*  PLT 191 148* 138* 140* 126*   Cardiac Enzymes: No results for input(s): CKTOTAL, CKMB, CKMBINDEX, TROPONINI in the last 168 hours. BNP: Invalid input(s): POCBNP CBG:  Recent Labs Lab 09/15/14 1952 09/15/14 2349 09/16/14 0441 09/16/14 0809 09/16/14 1148  GLUCAP 135* 135* 101* 118* 142*    Recent Results (from the past 240 hour(s))  Blood Culture (routine x 2)     Status: None (Preliminary result)   Collection Time: 09/14/14  6:33 PM  Result Value Ref Range Status   Specimen Description BLOOD LEFT WRIST  Final   Special Requests BOTTLES DRAWN AEROBIC AND ANAEROBIC 5CC EACH  Final   Culture   Final           BLOOD CULTURE RECEIVED NO GROWTH TO DATE CULTURE WILL BE HELD FOR 5 DAYS BEFORE  ISSUING A FINAL NEGATIVE REPORT Performed at [facility]    Report Status PENDING  Incomplete  Blood Culture (routine x 2)     Status: None (Preliminary result)   Collection Time: 09/14/14  6:40 PM  Result Value Ref Range Status   Specimen Description BLOOD RIGHT ANTECUBITAL  Final   Special Requests BOTTLES DRAWN AEROBIC AND ANAEROBIC 5CC EACH  Final   Culture   Final           BLOOD CULTURE RECEIVED NO GROWTH TO DATE CULTURE WILL BE HELD FOR 5 DAYS BEFORE ISSUING A FINAL NEGATIVE REPORT Performed at [facility]    Report Status PENDING  Incomplete  Urine culture     Status: None   Collection Time: 09/14/14  9:00 PM  Result Value Ref Range Status   Specimen Description URINE, CLEAN CATCH  Final   Special Requests NONE  Final   Colony Count NO GROWTH Performed at [facility]   Final   Culture NO GROWTH Performed at [facility]   Final   Report Status 09/16/2014 FINAL  Final     Scheduled Meds: . clopidogrel  75 mg  Oral Daily  . doxycycline  100 mg Oral Q12H  . enoxaparin (LOVENOX) injection  40 mg Subcutaneous QHS  . feeding supplement (RESOURCE BREEZE)  1 Container Oral BID BM  . pantoprazole  40 mg Oral Daily  . potassium chloride  40 mEq Oral Q6H   Continuous Infusions: . dextrose 5 % and 0.45% NaCl 10 mL/hr at 09/17/14 0700

## 2014-09-18 NOTE — Progress Notes (Signed)
ANTIBIOTIC CONSULT NOTE - Follow up  Pharmacy Consult for vancomycin, aztreonam Indication: Neutropenic fever, HCAP and sepsis  Allergies  Allergen Reactions  . Zosyn [Piperacillin Sod-Tazobactam So] Rash    Itching, rash and swelling of extremities  . Zithromax [Azithromycin]     Full body rash  . Ciprofloxacin Rash    Throwing up and stomach pain     Patient Measurements: Height: 5' 7"  (170.2 cm) Weight: 127 lb 6.8 oz (57.8 kg) IBW/kg (Calculated) : 61.6 Adjusted Body Weight:   Vital Signs: Temp: 98.1 F (36.7 C) (03/06 0518) Temp Source: Oral (03/06 0518) BP: 88/56 mmHg (03/06 0744) Pulse Rate: 87 (03/06 0518) Intake/Output from previous day: 03/05 0701 - 03/06 0700 In: 1042.7 [P.O.:800; I.V.:92.7; IV Piggyback:150] Out: -  Intake/Output from this shift:    Labs:  Recent Labs  09/16/14 0340 09/17/14 0715 09/18/14 0525  WBC 6.1 5.0 2.7*  HGB 8.1* 8.2* 7.6*  PLT 138* 140* 126*  CREATININE 0.73 0.71 0.58   Estimated Creatinine Clearance: 58 mL/min (by C-G formula based on Cr of 0.58).  Recent Labs  09/17/14 2054  [place] 10.4     Microbiology: Recent Results (from the past 720 hour(s))  Blood Culture (routine x 2)     Status: None (Preliminary result)   Collection Time: 09/14/14  6:33 PM  Result Value Ref Range Status   Specimen Description BLOOD LEFT WRIST  Final   Special Requests BOTTLES DRAWN AEROBIC AND ANAEROBIC 5CC EACH  Final   Culture   Final           BLOOD CULTURE RECEIVED NO GROWTH TO DATE CULTURE WILL BE HELD FOR 5 DAYS BEFORE ISSUING A FINAL NEGATIVE REPORT Performed at [facility]    Report Status PENDING  Incomplete  Blood Culture (routine x 2)     Status: None (Preliminary result)   Collection Time: 09/14/14  6:40 PM  Result Value Ref Range Status   Specimen Description BLOOD RIGHT ANTECUBITAL  Final   Special Requests BOTTLES DRAWN AEROBIC AND ANAEROBIC 5CC EACH  Final   Culture   Final           BLOOD CULTURE  RECEIVED NO GROWTH TO DATE CULTURE WILL BE HELD FOR 5 DAYS BEFORE ISSUING A FINAL NEGATIVE REPORT Performed at [facility]    Report Status PENDING  Incomplete  Urine culture     Status: None   Collection Time: 09/14/14  9:00 PM  Result Value Ref Range Status   Specimen Description URINE, CLEAN CATCH  Final   Special Requests NONE  Final   Colony Count NO GROWTH Performed at [facility]   Final   Culture NO GROWTH Performed at [facility]   Final   Report Status 09/16/2014 FINAL  Final    Medical History: Past Medical History  Diagnosis Date  . Multiple myeloma 07/01/2012  . Unspecified deficiency anemia   . OSTEOPOROSIS 06/11/2010    Multiple compression fractures; and spontaneous fracture of sternum Qualifier: Diagnosis of  By: [name], [name]     . 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 Cord compression 07/13/12    MRI- diffuse myeloma involvement of T-L spine  . History of radiation therapy 07/13/12-07/27/12    spinal cord compression T3-T10,left scapula  . CAD (coronary artery disease)     a. inf STEMI (in [place] [facility]) >>> LHC (8/15- [facility]):  pRCA 60%, mid to dist RCA 50% >>>  med rx  . Hx of echocardiogram     Echo (03/02/14-[facility]):  Normal LVEF without significant regional wall motion abnormalities. Mild     Medications:  Anti-infectives    Start     Dose/Rate Route Frequency Ordered Stop   09/18/14 1000  vancomycin (VANCOCIN) IVPB 750 mg/150 ml premix     750 mg 150 mL/hr over 60 Minutes Intravenous 2 times daily 09/18/14 0928     09/16/14 1000  vancomycin (VANCOCIN) 500 mg in sodium chloride 0.9 % 100 mL IVPB  Status:  Discontinued     500 mg 50 mL/hr over 120 Minutes Intravenous 2 times daily 09/16/14 0607 09/18/14 0928   09/15/14 0000  vancomycin (VANCOCIN) 500 mg in sodium chloride 0.9 % 100 mL IVPB  Status:  Discontinued     500 mg 100 mL/hr over 60 Minutes  Intravenous 2 times daily 09/14/14 2353 09/16/14 0607   09/15/14 0000  aztreonam (AZACTAM) 2 g in dextrose 5 % 50 mL IVPB     2 g 100 mL/hr over 30 Minutes Intravenous 3 times per day 09/14/14 2353       Assessment: HPI: HCAP/sepsis/neutropenic fever with MM currently on Revlimid PTA --She was seen at [facility] had a CXR done and was diagnosed with PNA she was given Vancomycin and ceftriaxone (hospitalized 1/25-1/30 with strep pneumoniae antigen +, RSV B +). She was discharged to home at that time her WBC was 6. Patient rapidly decompensated became febrile with worsening shortness of breath and presented to [facility] ER. She was found to be febrile up to 102.3. revlimid PTA  PTA: Vanc and Ceftriaxone x1 dose at [facility] 3/2 Inpatient: 3/3 >> Vanc >> 3/3 >> Aztreo >>  Tmax: 102.3 on 3/2, 100.7 on 3/3, afebrile since WBCs: Neutropenia resolved, now trending down again. Revlimid off right now - last dose 2/29 per notes Renal: SCr wnl, stable. CrCl 28m/min Lactic acid: 4.57 > 2 > 2.3 > 2.2 PCT: 32.98  Micro: 3/2 blood x2: ngtd 3/2 urine: neg 3/2 sputum: ? 3/3 Legionella antigen urine: negative 3/3 Strep pneumoniae urine: negative  3/5 Vanc trough = 10.4 on [id number]  Goal of Therapy:  Vancomycin trough level 15-20 mcg/ml  Aztreonam dosed based on patient weight and renal function  Plan: Increase Vancomycin to 75023mV q12h. Continue Aztreonam 2g IV q8h.  Recheck Vanc trough at steady state. Follow up renal fxn, culture results, and clinical course.  TomRomeo RabonharmD, pager [phone]/12/2014,9:31 AM.

## 2014-09-19 ENCOUNTER — Inpatient Hospital Stay (HOSPITAL_COMMUNITY): Payer: Medicare Other

## 2014-09-19 DIAGNOSIS — Z9484 Stem cells transplant status: Secondary | ICD-10-CM

## 2014-09-19 DIAGNOSIS — D539 Nutritional anemia, unspecified: Secondary | ICD-10-CM

## 2014-09-19 DIAGNOSIS — J189 Pneumonia, unspecified organism: Secondary | ICD-10-CM | POA: Insufficient documentation

## 2014-09-19 MED ORDER — GUAIFENESIN ER 600 MG PO TB12
1200.0000 mg | ORAL_TABLET | Freq: Two times a day (BID) | ORAL | Status: DC
  Administered 2014-09-19: 1200 mg via ORAL
  Filled 2014-09-19: qty 2

## 2014-09-19 MED ORDER — DOXYCYCLINE HYCLATE 100 MG PO TABS: 100.0000 mg | ORAL_TABLET | Freq: Two times a day (BID) | ORAL | Status: DC

## 2014-09-19 NOTE — Discharge Summary (Signed)
Physician Discharge Summary  Shannon Rush DGU:[id number] DOB: 1941/07/04 DOA: 09/14/2014  PCP: [name], MD  Admit date: 09/14/2014 Discharge date: 09/19/2014  Time spent: 35 minutes  Recommendations for Outpatient Follow-up:  1. Please follow up on CBC, she has a history of multiple myeloma, pancytopenia resulting from revlamid therapy, treated for febrile neutropenia.  2. Follow up on BMP, she was treated for hypokalemia during this hospitailzation 3. Follow up on PNA, she has multiple antibiotic allergies, was discharged on Doxy  Discharge Diagnoses:  Active Problems:   Anemia, macrocytic   Multiple myeloma   History of peripheral stem cell transplant   Coronary atherosclerosis of native coronary artery   HCAP (healthcare-associated pneumonia)   Febrile neutropenia   Pancytopenia   Sepsis   Blood poisoning   Severe sepsis   Community acquired pneumonia   Discharge Condition: Stable/improved  Diet recommendation: Regular diet  Filed Weights   09/15/14 0500 09/16/14 0400 09/17/14 0300  Weight: 59.5 kg (131 lb 2.8 oz) 61.4 kg (135 lb 5.8 oz) 57.8 kg (127 lb 6.8 oz)    History of present illness:  Presented with  Patient has hx of multiple myeloma sp Bone marrow transplant 08/20/ 2014 followed by [facility]. She is currently on Revlimid last dose was 2 days ago but currently this has been suspended due to PNA. Patient reports cough for the past 1 week.  She was seen at [facility] had a CXR done and was diagnosed with PNA she was given Vancomycin and ceftriaxone. She was discharged to home at that time her WBC was 6. Patient rapidly decompensated became febrile with worsening shortness of breath and presented to [facility] ER. She was found to be febrile up to 102.3 CXR confirmed PNA. WBC 0.6 ANC 0.5. She was noted to be hypotensive with BP 96/50. She is sp 4 L.  Oncology has been consulted at this point the recommendation is to admit to [facility] for treatment of sepsis and  neutropenic fever. Patient was given an injection of Tbo-Filgrastim 450 g. Oncology will follow up in the morning  Hospital Course:  Acute respiratory failure with hypoxia / healthcare associated pneumonia - Patient was hypoxic on the admission with oxygen saturation of 85% on room air. Chest x-ray on the admission showed right lower lung opacities likely due to pneumonia. -Repeat chest x-ray on 09/16/2014 showing persistent infiltrate -Showing clinical improvement, as she is presently satting mid 90s on 2 L supplemental oxygen -Will discontinue aztreonam and vancomycin. Patient having multiple drug allergies including allergies to Zosyn and ciprofloxacin. -Will transition to doxycycline 100 mg by mouth twice a day   Active Problems: Severe sepsis secondary to healthcare associated pneumonia - Sepsis criteria met on the admission with hypotension, tachycardia, tachypnea, fever, hypoxia. In addition patient neutropenic with white blood cell count 0.6 (neutrophils 0.5), lactic acid 4.57. Procalcitonin level 32.98.Chest x-ray with evidence of pneumonia in right lower lung lobe. -Blood cultures remain negative -Patient receiving 4 days of broad-spectrum IV antimicrobial therapy with IV vancomycin and aztreonam, was transioned to oral antibiotic therapy with Doxy. She has multiple drug allergies including zosyn, cipro and zithromax.   Febrile neutropenia - Likely sequela of chemotherapy, Revlimid - White blood cell count 0.6 on the admission. CBC on 09/17/2014 showing white count of 5.0. - Patient started on broad-spectrum antibiotics. Febrile on the admission but no fevers since then. - Blood cultures drawn on 09/14/2014 showed no growth 2 sets  History of multiple myeloma - Currently on Revlimid which is  put on hold because of acute infection, sepsis  Anemia of chronic disease - Secondary to history of multiple myeloma and chemotherapy - Please follow up on CBC. On day of discharge she  denied symptoms of anemia.   Hypokalemia - Potassium 3.3 for which she received potassium replacement.  -Follow up on BMP   Disposition On day of discharge she ambulated down the hall way on room air, maintained sat of 92%, denies weakness, fatigue, dizziness, lightheadedness. She stating feeling better and expressed desire to go home today. I explained to her that she was anemic however she reassured me that she would follow up in several days to have repeat CBC.    Discharge Exam: Filed Vitals:   09/19/14 0419  BP: 103/59  Pulse: 88  Temp: 98.2 F (36.8 C)  Resp: 20    General: No acute distress, ambulated down the hallway, tolerated physical exertion well.  Cardiovascular: Regular rate and rhythm, normal S1S2 Respiratory: Normal inspiratory effort, having a few bibasilar crackles Abdomen: Soft, nontender nondistended  Discharge Instructions   Discharge Instructions    Call MD for:  difficulty breathing, headache or visual disturbances    Complete by:  As directed      Call MD for:  extreme fatigue    Complete by:  As directed      Call MD for:  hives    Complete by:  As directed      Call MD for:  persistant dizziness or light-headedness    Complete by:  As directed      Call MD for:  persistant nausea and vomiting    Complete by:  As directed      Call MD for:  redness, tenderness, or signs of infection (pain, swelling, redness, odor or green/yellow discharge around incision site)    Complete by:  As directed      Call MD for:  severe uncontrolled pain    Complete by:  As directed      Call MD for:  temperature >100.4    Complete by:  As directed      Diet - low sodium heart healthy    Complete by:  As directed      Increase activity slowly    Complete by:  As directed           Current Discharge Medication List    START taking these medications   Details  doxycycline (VIBRA-TABS) 100 MG tablet Take 1 tablet (100 mg total) by mouth every 12 (twelve)  hours. Qty: 12 tablet, Refills: 0      CONTINUE these medications which have NOT CHANGED   Details  acetaminophen (TYLENOL) 500 MG tablet Take 500 mg by mouth every 6 (six) hours as needed for moderate pain (pain).    atorvastatin (LIPITOR) 40 MG tablet Take 1 tablet (40 mg total) by mouth daily. Qty: 30 tablet, Refills: 6    calcium carbonate (OS-CAL) 600 MG TABS tablet Take 600 mg by mouth daily.    clopidogrel (PLAVIX) 75 MG tablet Take 1 tablet (75 mg total) by mouth daily. Qty: 30 tablet, Refills: 6    guaiFENesin (ROBITUSSIN) 100 MG/5ML liquid Take 200 mg by mouth 3 (three) times daily as needed for cough (cough).    vitamin D, CHOLECALCIFEROL, 400 UNITS tablet Take 400 Units by mouth daily.    morphine (MSIR) 15 MG tablet Take 1 tablet (15 mg total) by mouth every 6 (six) hours as needed for severe pain. Qty: 30 tablet,  Refills: 0   Associated Diagnoses: Multiple myeloma, without mention of having achieved remission      STOP taking these medications     aspirin 81 MG EC tablet      metoprolol succinate (TOPROL-XL) 25 MG 24 hr tablet        Allergies  Allergen Reactions  . Zosyn [Piperacillin Sod-Tazobactam So] Rash    Itching, rash and swelling of extremities  . Zithromax [Azithromycin]     Full body rash  . Ciprofloxacin Rash    Throwing up and stomach pain    Follow-up Information    Follow up with [name], MD In 2 days.   Specialty:  Family Medicine   Contact information:   [place] [address] [phone]        The results of significant diagnostics from this hospitalization (including imaging, microbiology, ancillary and laboratory) are listed below for reference.    Significant Diagnostic Studies: Dg Chest 2 View  09/19/2014   CLINICAL DATA:  Multiple myeloma. Previous bone marrow transplant 2014. Recently diagnosed with pneumonia and treated with vancomycin and Rocephin. Patient decompensated after  discharge. She reports ongoing cough, fatigue, weakness. Patient has neutropenia, low oxygen saturation.  EXAM: CHEST  2 VIEW  COMPARISON:  09/16/2014  FINDINGS: Heart is normal in size. Increased bilateral pleural effusions. Bibasilar atelectasis. Persistent right upper lobe infiltrate. No pulmonary edema. Chronic left clavicle fracture. Multiple wedge compression fractures of the thoracic vertebrae associated with marked kyphotic thoracic deformity. Sternal deformity.  IMPRESSION: 1. Increased bilateral pleural effusions. 2. Persistent right upper lobe infiltrate.   Electronically Signed   By: Nolon Nations M.D.   On: 09/19/2014 11:47   Dg Chest Port 1 View  09/16/2014   CLINICAL DATA:  Pneumonia.  Follow-up.  EXAM: PORTABLE CHEST - 1 VIEW  COMPARISON:  09/14/2014.  08/08/2014.  FINDINGS: Heart size remains normal. The aorta is calcified and unfolded. There is persistent patchy density in both lower lobes consistent with pneumonia. There is a pattern of increased diffuse interstitial density that could represent fluid overload/ edema. Superior mediastinal density appears increased raising the possibility of lymphadenopathy. There is a small amount of pleural fluid, right more than left.  IMPRESSION: Persistent infiltrate and volume loss in both lung bases.  Development of interstitial density consistent with edema/ fluid overload.  Small effusions right more than left.  Increased superior mediastinal density raising the possibility of lymphadenopathy.   Electronically Signed   By: Nelson Chimes M.D.   On: 09/16/2014 07:21   Dg Chest Port 1 View  09/14/2014   CLINICAL DATA:  Cough for 10 days. Fever for 3 days. Shortness of breath.  EXAM: PORTABLE CHEST - 1 VIEW  COMPARISON:  PA and lateral chest 12/20/2013 and 08/08/2014.  FINDINGS: Extensive airspace disease is seen in the right lower lung zone, worsened since the most recent prior examination. The left lung appears clear. Heart size is upper normal. No  pneumothorax or pleural effusion. Remote left rib fractures are noted.  IMPRESSION: Worsened right lower lung zone airspace disease likely due to pneumonia. Recommend followup films to clearing.   Electronically Signed   By: Inge Rise M.D.   On: 09/14/2014 19:35    Microbiology: Recent Results (from the past 240 hour(s))  Blood Culture (routine x 2)     Status: None (Preliminary result)   Collection Time: 09/14/14  6:33 PM  Result Value Ref Range Status   Specimen Description BLOOD LEFT WRIST  Final  Special Requests BOTTLES DRAWN AEROBIC AND ANAEROBIC 5CC EACH  Final   Culture   Final           BLOOD CULTURE RECEIVED NO GROWTH TO DATE CULTURE WILL BE HELD FOR 5 DAYS BEFORE ISSUING A FINAL NEGATIVE REPORT Performed at [facility]    Report Status PENDING  Incomplete  Blood Culture (routine x 2)     Status: None (Preliminary result)   Collection Time: 09/14/14  6:40 PM  Result Value Ref Range Status   Specimen Description BLOOD RIGHT ANTECUBITAL  Final   Special Requests BOTTLES DRAWN AEROBIC AND ANAEROBIC 5CC EACH  Final   Culture   Final           BLOOD CULTURE RECEIVED NO GROWTH TO DATE CULTURE WILL BE HELD FOR 5 DAYS BEFORE ISSUING A FINAL NEGATIVE REPORT Performed at [facility]    Report Status PENDING  Incomplete  Urine culture     Status: None   Collection Time: 09/14/14  9:00 PM  Result Value Ref Range Status   Specimen Description URINE, CLEAN CATCH  Final   Special Requests NONE  Final   Colony Count NO GROWTH Performed at [facility]   Final   Culture NO GROWTH Performed at [facility]   Final   Report Status 09/16/2014 FINAL  Final     Labs: Basic Metabolic Panel:  Recent Labs Lab 09/14/14 1841 09/15/14 0320 09/16/14 0340 09/17/14 0715 09/18/14 0525  NA 132* 134* 136 136 138  K 3.0* 3.3* 3.1* 3.2* 3.3*  CL 103 110 115* 106 106  CO2 19 18* 18* 26 25  GLUCOSE 177* 118* 109* 99 89  BUN _0 CREATININE 0.95 0.85 0.73 0.71 0.58  CALCIUM 8.7 7.1* 7.5* 7.5* 7.8*  MG  --  1.3*  --   --   --   PHOS  --  2.7  --   --   --    Liver Function Tests:  Recent Labs Lab 09/14/14 1841 09/15/14 0320  AST 147* 91*  ALT 71* 63*  ALKPHOS 294* 185*  BILITOT 1.2 0.7  PROT 7.5 5.7*  ALBUMIN 3.3* 2.4*   No results for input(s): LIPASE, AMYLASE in the last 168 hours. No results for input(s): AMMONIA in the last 168 hours. CBC:  Recent Labs Lab 09/14/14 1841 09/15/14 0320 09/16/14 0340 09/17/14 0715 09/18/14 0525  WBC 0.6* 3.6* 6.1 5.0 2.7*  NEUTROABS 0.5* 3.3  --   --   --   HGB 9.8* 8.2* 8.1* 8.2* 7.6*  HCT 29.4* 24.5* 24.5* 24.2* 22.5*  MCV 104.3* 102.9* 104.3* 102.5* 102.7*  PLT 191 148* 138* 140* 126*   Cardiac Enzymes: No results for input(s): CKTOTAL, CKMB, CKMBINDEX, TROPONINI in the last 168 hours. BNP: BNP (last 3 results) No results for input(s): BNP in the last 8760 hours.  ProBNP (last 3 results) No results for input(s): PROBNP in the last 8760 hours.  CBG:  Recent Labs Lab 09/15/14 1952 09/15/14 2349 09/16/14 0441 09/16/14 0809 09/16/14 1148  GLUCAP 135* 135* 101* 118* 142*       Signed:  Allea Kassner  [facility] 09/19/2014, 2:18 PM

## 2014-09-20 ENCOUNTER — Telehealth: Payer: Self-pay | Admitting: *Deleted

## 2014-09-20 ENCOUNTER — Other Ambulatory Visit: Payer: Self-pay | Admitting: Hematology

## 2014-09-20 NOTE — Telephone Encounter (Signed)
Pt called and left message re:  Pt was discharged from the hospital yesterday 09/19/14.  Pt wanted to know when she could come in for f/u with Dr. Burr Medico. Pt's  Phone    [phone].

## 2014-09-21 LAB — CULTURE, BLOOD (ROUTINE X 2)
CULTURE: NO GROWTH
CULTURE: NO GROWTH

## 2014-09-22 ENCOUNTER — Other Ambulatory Visit: Payer: Medicare Other

## 2014-09-22 ENCOUNTER — Ambulatory Visit: Payer: Medicare Other | Admitting: Hematology

## 2014-09-23 ENCOUNTER — Ambulatory Visit (INDEPENDENT_AMBULATORY_CARE_PROVIDER_SITE_OTHER): Payer: Medicare Other | Admitting: Family Medicine

## 2014-09-23 ENCOUNTER — Encounter: Payer: Self-pay | Admitting: Family Medicine

## 2014-09-23 VITALS — BP 127/88 | HR 88 | Temp 98.1°F | Ht 67.0 in | Wt 124.6 lb

## 2014-09-23 DIAGNOSIS — D539 Nutritional anemia, unspecified: Secondary | ICD-10-CM

## 2014-09-23 DIAGNOSIS — J189 Pneumonia, unspecified organism: Secondary | ICD-10-CM | POA: Diagnosis not present

## 2014-09-23 DIAGNOSIS — E46 Unspecified protein-calorie malnutrition: Secondary | ICD-10-CM

## 2014-09-23 LAB — CBC
HEMATOCRIT: 29.4 % — AB (ref 36.0–46.0)
HEMOGLOBIN: 9.7 g/dL — AB (ref 12.0–15.0)
MCH: 34.3 pg — ABNORMAL HIGH (ref 26.0–34.0)
MCHC: 33 g/dL (ref 30.0–36.0)
MCV: 103.9 fL — AB (ref 78.0–100.0)
MPV: 9.3 fL (ref 8.6–12.4)
PLATELETS: 211 10*3/uL (ref 150–400)
RBC: 2.83 MIL/uL — AB (ref 3.87–5.11)
RDW: 17.7 % — ABNORMAL HIGH (ref 11.5–15.5)
WBC: 3.8 10*3/uL — ABNORMAL LOW (ref 4.0–10.5)

## 2014-09-23 LAB — COMPREHENSIVE METABOLIC PANEL
ALBUMIN: 3.3 g/dL — AB (ref 3.5–5.2)
ALT: 14 U/L (ref 0–35)
AST: 16 U/L (ref 0–37)
Alkaline Phosphatase: 102 U/L (ref 39–117)
BUN: 15 mg/dL (ref 6–23)
CO2: 28 mEq/L (ref 19–32)
Calcium: 8.7 mg/dL (ref 8.4–10.5)
Chloride: 104 mEq/L (ref 96–112)
Creat: 0.66 mg/dL (ref 0.50–1.10)
Glucose, Bld: 81 mg/dL (ref 70–99)
POTASSIUM: 5.2 meq/L (ref 3.5–5.3)
Sodium: 137 mEq/L (ref 135–145)
Total Bilirubin: 0.5 mg/dL (ref 0.2–1.2)
Total Protein: 6.4 g/dL (ref 6.0–8.3)

## 2014-09-23 MED ORDER — DOXYCYCLINE HYCLATE 100 MG PO TABS: 100.0000 mg | ORAL_TABLET | Freq: Two times a day (BID) | ORAL | Status: DC

## 2014-09-23 NOTE — Progress Notes (Signed)
   Subjective:    Patient ID: Shannon Rush, female    DOB: 08/25/1940, 74 y.o.   MRN: [id number]  HPI Unfortunately, hospitalized with a second case of pneumonia.  She was traveling and likely exposed.  She is also considered immunocompromised due to her chemo treatments.  Currently on doxy and was given a total of 2week antibiotic course. No suggestion of aspiration.  Had rare nighttime cough so GERD is a possible risk factor. Reviewed films.  Does have bilateral pleural effusion. Labs showed a significant anemia and a low albumin.    Review of Systems     Objective:   Physical Exam Lungs clear Cardiac 2/6 SEM 1+ symteric ankle edema. No resp distress or tachypnea. Mouth, lip stomatitis consistent with herpes simplex.  Patient states present x 2 weeks.        Assessment & Plan:

## 2014-09-23 NOTE — Patient Instructions (Signed)
I will call Monday with the blood test results. Get the Chest Xray on Monday or Tuesday next week Talk to Dr. Burr Medico about going back on acyclovir.   There is a small chance that I will need to get the specialist to sample the pleural fluid.

## 2014-09-23 NOTE — Assessment & Plan Note (Signed)
Given second pneumonia and now pleural effusions (small and would be difficult to tap), I want to be conservative and prolong the antibiotic course.  Also, I want a follow up CXR before stopping the doxy.

## 2014-09-23 NOTE — Assessment & Plan Note (Signed)
Recent labs:

## 2014-09-27 ENCOUNTER — Ambulatory Visit
Admission: RE | Admit: 2014-09-27 | Discharge: 2014-09-27 | Disposition: A | Discharge status: Home / Self Care | Payer: Medicare Other | Source: Ambulatory Visit | Attending: Family Medicine | Admitting: Family Medicine

## 2014-09-27 DIAGNOSIS — J189 Pneumonia, unspecified organism: Secondary | ICD-10-CM | POA: Diagnosis not present

## 2014-09-27 DIAGNOSIS — J90 Pleural effusion, not elsewhere classified: Secondary | ICD-10-CM | POA: Diagnosis not present

## 2014-09-28 ENCOUNTER — Encounter: Payer: Self-pay | Admitting: Hematology

## 2014-09-28 ENCOUNTER — Other Ambulatory Visit (HOSPITAL_BASED_OUTPATIENT_CLINIC_OR_DEPARTMENT_OTHER): Payer: Medicare Other

## 2014-09-28 ENCOUNTER — Ambulatory Visit (HOSPITAL_BASED_OUTPATIENT_CLINIC_OR_DEPARTMENT_OTHER): Payer: Medicare Other | Admitting: Hematology

## 2014-09-28 ENCOUNTER — Telehealth: Payer: Self-pay | Admitting: Hematology

## 2014-09-28 VITALS — BP 111/60 | HR 78 | Temp 98.1°F | Resp 18 | Ht 67.0 in | Wt 122.1 lb

## 2014-09-28 DIAGNOSIS — J189 Pneumonia, unspecified organism: Secondary | ICD-10-CM

## 2014-09-28 DIAGNOSIS — C9000 Multiple myeloma not having achieved remission: Secondary | ICD-10-CM

## 2014-09-28 DIAGNOSIS — D6959 Other secondary thrombocytopenia: Secondary | ICD-10-CM

## 2014-09-28 LAB — COMPREHENSIVE METABOLIC PANEL (CC13)
ALBUMIN: 3.1 g/dL — AB (ref 3.5–5.0)
ALT: 19 U/L (ref 0–55)
ANION GAP: 7 meq/L (ref 3–11)
AST: 23 U/L (ref 5–34)
Alkaline Phosphatase: 87 U/L (ref 40–150)
BUN: 21.2 mg/dL (ref 7.0–26.0)
CO2: 25 meq/L (ref 22–29)
Calcium: 8.5 mg/dL (ref 8.4–10.4)
Chloride: 107 mEq/L (ref 98–109)
Creatinine: 0.8 mg/dL (ref 0.6–1.1)
EGFR: 78 mL/min/{1.73_m2} — ABNORMAL LOW (ref >90)
GLUCOSE: 73 mg/dL (ref 70–140)
Potassium: 4.3 mEq/L (ref 3.5–5.1)
SODIUM: 139 meq/L (ref 136–145)
TOTAL PROTEIN: 6.7 g/dL (ref 6.4–8.3)
Total Bilirubin: 0.42 mg/dL (ref 0.20–1.20)

## 2014-09-28 LAB — CBC WITH DIFFERENTIAL/PLATELET
BASO%: 4.9 % — AB (ref 0.0–2.0)
Basophils Absolute: 0.2 10*3/uL — ABNORMAL HIGH (ref 0.0–0.1)
EOS%: 4.5 % (ref 0.0–7.0)
Eosinophils Absolute: 0.2 10*3/uL (ref 0.0–0.5)
HCT: 29.6 % — ABNORMAL LOW (ref 34.8–46.6)
HEMOGLOBIN: 9.5 g/dL — AB (ref 11.6–15.9)
LYMPH%: 21.5 % (ref 14.0–49.7)
MCH: 33.8 pg (ref 25.1–34.0)
MCHC: 32.1 g/dL (ref 31.5–36.0)
MCV: 105.4 fL — AB (ref 79.5–101.0)
MONO#: 0.4 10*3/uL (ref 0.1–0.9)
MONO%: 12.1 % (ref 0.0–14.0)
NEUT%: 57 % (ref 38.4–76.8)
NEUTROS ABS: 2.1 10*3/uL (ref 1.5–6.5)
Platelets: 268 10*3/uL (ref 145–400)
RBC: 2.81 10*6/uL — AB (ref 3.70–5.45)
RDW: 19.1 % — ABNORMAL HIGH (ref 11.2–14.5)
WBC: 3.7 10*3/uL — AB (ref 3.9–10.3)
lymph#: 0.8 10*3/uL — ABNORMAL LOW (ref 0.9–3.3)

## 2014-09-28 NOTE — Progress Notes (Signed)
I [place]. Regarding a patient more (or I would like to you regarding she call back with use of 9 15 and his vision is without a [place] NOTE DATE OF VISIT: 05/12/2014  Shannon Gottron, MD [place] [address]  DIAGNOSIS: Multiple myeloma - Plan: SPEP & IFE with QIG, 24 hour urine, UPEP (protein electrophoresis), Kappa/lambda light chains, 24 Hr Urine, Kappa/Lambda Light Chains, IFE, Urine (with Tot Prot)  CHIEF COMPLAINS: Follow-up of multiple myeloma  CURRENT TREATMENT:  Zometa 3.5 mg monthly started on 06/2013;  Revlimid 5 mg daily for 21 days then off for 7 days for maintenance for at least 2 years started on 09/02/2013.  Decreased to Revlimid 2.5 mg daily for 21 days then off for 7 days on 12/30/2013.     Multiple myeloma   05/26/2012 Initial Diagnosis Presenting IgG was 4,040 mg/dL on 06/05/2012 (IgA 35; IgM 34); kappa free light chain 34.4 mg/dL, lambda 0.00, kappa:lambda ratio of 34.75; SPEP with M-spike of 2.60.   06/30/2012 Imaging Numerous lytic lesions throughout the calvarium.  Lytic lesion within the left lateral scapula.  Findings most compatible with metastases or myeloma. Multiple mid and lower thoracic compression fractures.  Slight compression through the endplates at L3.   19/14/7829 Bone Marrow Biopsy Bone marrow biopsy showed 49% plasma cell. Normal classical cytogenetics; however, myeloma FISH panel showed 13q- (intermediate risk)       07/14/2012 - 07/27/2012 Radiation Therapy Palliative radiation 20 Gy over 10 fractions between to thoracic spine cord compression and symptomatic left scapula lytic lesion.   08/10/2012 -  Chemotherapy Started SQ Velcade once weekly, 3 weeks on, 1 weeks off; daily Revlimid d1-21, 7 days off; and Dexamethasone 49m PO weekly.    02/12/2013 [facility]    06/14/2013 Tumor Marker IgG  1830,  Kappa:lamba  ratio, 1.69 (baseline was 34.75 or   M-spike 0.28 (baseline of 2.6 or  89.3% of baseline).     06/25/2013 -  Chemotherapy Started zometa 3.5 mg monthly    09/01/2013 -  Chemotherapy Maintenance therapy with revlimid 539mdaily for 14 days and then off for 7 (decreased from 21 days on and 7 days off based on neutropenia).    09/13/2013 - 09/17/2014 Hospital Admission Hospital admission for pneumonia   12/20/2013 Treatment Plan Change Maintenance therapy decreased to 2.5 mg daily for 21 days and then off for 7 days based on low counts.    01/25/2014 Tumor Marker IgG 1360 M spike 0.5   03/01/2014 - 03/05/2014 [facility], received thrombolytic therapy, cath showed 60% prox RCA, mid-distal RCA 50%, Echo normal, no stents.   04/11/2014 -  Chemotherapy Continue Revlimid at 2.5 mg 3 weeks on 1 week off   08/08/2014 - 08/13/2014 Hospital Admission Hospital admission for pneumonia. Her Revlimid was held.   08/29/2014 -  Chemotherapy Maintenance Revlimid restarted   INTERVAL HISTORY:  ClKYIAH CANEPA89.o. female with a past medical history of MM as detailed above, s/p SCT is here for follow-up.   She went to [place] the end of February for location. She started having fever and productive cough the day before she came back. [facility] she [facility] trip. She was febrile and had a productive cough at that time, and chest x-ray showed probable  pneumonia. She was given IV antibiotics in the [place] clinic, and she came back to [place]. However her symptoms got worse and she was quite short of breast. She went to emergency room at [facility] and was found to be hypoxic and neutropenic with ANC 0.5. She was admitted to the hospital and started on broad antibiotics again and was given 1 dose of Neupogen. She was discharged home with doxycycline on 09/13/2014.  She has been recovering well from the recent  hospitalization. Her productive cough is much less, although she still has small amount yellow sputum production. No fever or chills, her dyspnea has nearly resolved. She saw her primary care physician Dr. Andria Rush yesterday, repeat this chest x-ray showed significant improvement and pleural effusion is much less. Dr. Andria Rush extended her antibiotics to additional 1 week.  MEDICAL HISTORY: Past Medical History  Diagnosis Date  . Multiple myeloma 07/01/2012  . Unspecified deficiency anemia   . OSTEOPOROSIS 06/11/2010    Multiple compression fractures; and spontaneous fracture of sternum Qualifier: Diagnosis of  By: Shannon Iba NP, Shannon Rush     . 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 Cord compression 07/13/12    MRI- diffuse myeloma involvement of T-L spine  . History of radiation therapy 07/13/12-07/27/12    spinal cord compression T3-T10,left scapula  . CAD (coronary artery disease)     a. inf STEMI (in [place] >>> lytics) >>> LHC (8/15- [place]):  pRCA 60%, mid to dist RCA 50% >>> med rx  . Hx of echocardiogram     Echo (03/02/14-done at [facility]):  Normal LVEF without significant regional wall motion abnormalities. Mild      ALLERGIES:  is allergic to zosyn; zithromax; and ciprofloxacin.  MEDICATIONS: has a current medication list which includes the following prescription(s): acetaminophen, atorvastatin, calcium carbonate, clopidogrel, doxycycline, vitamin d (cholecalciferol), guaifenesin, metoprolol succinate, morphine, and proair hfa.  SURGICAL HISTORY:  Past Surgical History  Procedure Laterality Date  . Appendectomy    . Tubal ligation    . Cesarean section      x2   . Hip surgery  2009    left  . Elbow surgery    . Colonoscopy  2007    neg with Dr. Watt Rush    REVIEW OF SYSTEMS:   Constitutional: Denies abnormal weight loss, (+) fatigue Eyes: Denies blurriness of vision Ears, nose, mouth, throat, and face: Denies mucositis or  sore throat, (+) left tooth pain  Respiratory: Denies cough, dyspnea or wheezes Cardiovascular: Denies palpitation, chest discomfort or lower extremity swelling Gastrointestinal:  Denies nausea, heartburn or change in bowel habits Skin: Denies abnormal skin rashes Lymphatics: Denies new lymphadenopathy or easy bruising Neurological:Denies numbness, tingling or new weaknesses Behavioral/Psych: Mood is stable, no new changes  All other systems were reviewed with the patient and are negative.  PHYSICAL EXAMINATION: ECOG PERFORMANCE STATUS: 0  Blood pressure 111/60, pulse 78, temperature 98.1 F (36.7 C), resp. rate 18, height _0  (1.702 m), weight 122 lb 1.6 oz (55.384 kg).  GENERAL:alert, no distress and comfortable; easily mobile to exam table; kyphosis, moderate.  SKIN: skin color, texture, turgor are normal, no rashes or significant lesions; Abrasion well healed on RLE.  EYES: normal, Conjunctiva are pink, 1 tear/injectionof blood vessel left eye in sclera. OROPHARYNX:no exudate, no erythema and lips, buccal mucosa, and tongue normal, no ONJ NECK: supple, thyroid normal size, non-tender, without nodularity; nodules cervical bilaterally.  LYMPH:  no palpable lymphadenopathy  in the cervical, axillary or supraclavicular LUNGS: crackles at the bases bilaterally with normal breathing effort, no wheezes or rhonchi HEART: regular rate & rhythm and no murmurs and no lower extremity edema ABDOMEN:abdomen soft, non-tender and normal bowel sounds Musculoskeletal:no cyanosis of digits and no clubbing  NEURO: alert & oriented x 3 with fluent speech, no focal motor/sensory deficits  Labs:  CBC Latest Ref Rng 09/28/2014 09/23/2014 09/18/2014  WBC 3.9 - 10.3 10e3/uL 3.7(L) 3.8(L) 2.7(L)  Hemoglobin 11.6 - 15.9 g/dL 9.5(L) 9.7(L) 7.6(L)  Hematocrit 34.8 - 46.6 % 29.6(L) 29.4(L) 22.5(L)  Platelets 145 - 400 10e3/uL 268 211 126(L)    CMP Latest Ref Rng 09/28/2014 09/23/2014 09/18/2014  Glucose 70 - 140  mg/dl 73 81 89  BUN 7.0 - 26.0 mg/dL 21._0 Creatinine 0.6 - 1.1 mg/dL 0.8 0.66 0.58  Sodium 136 - 145 mEq/L 139 137 138  Potassium 3.5 - 5.1 mEq/L 4.3 5.2 3.3(L)  Chloride 96 - 112 mEq/L - 104 106  CO2 22 - 29 mEq/L _1 Calcium 8.4 - 10.4 mg/dL 8.5 8.7 7.8(L)  Total Protein 6.4 - 8.3 g/dL 6.7 6.4 -  Total Bilirubin 0.20 - 1.20 mg/dL 0.42 0.5 -  Alkaline Phos 40 - 150 U/L 87 102 -  AST 5 - 34 U/L 23 16 -  ALT 0 - 55 U/L 19 14 -    RADIOGRAPHIC STUDIES: None  ASSESSMENT: Karel Jarvis 74 y.o. female with a history of Multiple Myeloma s/p autotransplant in August 2014 now on maintenence low dose Revlimid therapy  PLAN:   1. IgG kappa Multiple myeloma s/p Auto SCT, intermediate risk.  --MM markers demonstrate a very good response (M protein baseline 2.6, down to 0.28 after induction chemo and transplant, stable around 0.5 since July 2015,  Most recent 0.45)  - Acyclovir has been stopped after 1 year of SCT.   -- She started revlimid 5 mg daily for maintenance on 09/02/2013.   Decreased to 2.5 daily for 3 weeks on and then one week off on 12/20/2013.  -Revlimid was recently held due to her pneumonia. Giving her 2 episodes of any pneumonia quite hospitalization, I'll hold on Revlimid for 4-6 weeks this time until she completely recovery.  -We'll repeat her multiple myeloma blood in the urine test on next visit. -- Zometa will be held until she complete her dental work. --vaccine are uptodate  2. Recurrent pneumonia -She is going to finish doxycycline next week.  2. Left lower premolar tooth infection - Her dentist recommends tooth extraction.  -I'll hold Zometa until she is cleared from dental work.   3. Pancytopenia due to chemotherapy. --   Her blood counts improved after she is off Revlimid.  3. Osteoporosis -recently repeated bone density scan showed worsening osteoporosis, will continue zometa monthly for additional 6 month then change to every 3 months, but will  hold temporary for her dental procedure.    Plan -hold on Revlimid for now. -RTC in 2 weeks  -Hold Zometa  until she completes her dental work  All questions were answered. The patient knows to call the clinic with any problems, questions or concerns. We can certainly see the patient much sooner if necessary.  I spent 20 minutes counseling the patient face to face. The total time spent in the appointment was 25 minutes.   [name]  09/28/2014

## 2014-09-28 NOTE — Telephone Encounter (Signed)
Confirm appointment for April.

## 2014-09-29 ENCOUNTER — Telehealth: Payer: Self-pay | Admitting: Hematology

## 2014-09-29 NOTE — Telephone Encounter (Signed)
Left message rescheduling labs for 03/31.

## 2014-10-12 ENCOUNTER — Other Ambulatory Visit: Payer: Medicare Other

## 2014-10-13 ENCOUNTER — Other Ambulatory Visit: Payer: Self-pay | Admitting: *Deleted

## 2014-10-13 ENCOUNTER — Other Ambulatory Visit (HOSPITAL_BASED_OUTPATIENT_CLINIC_OR_DEPARTMENT_OTHER): Payer: Medicare Other

## 2014-10-13 DIAGNOSIS — C9000 Multiple myeloma not having achieved remission: Secondary | ICD-10-CM

## 2014-10-13 LAB — COMPREHENSIVE METABOLIC PANEL (CC13)
ALT: 21 U/L (ref 0–55)
AST: 22 U/L (ref 5–34)
Albumin: 3.6 g/dL (ref 3.5–5.0)
Alkaline Phosphatase: 65 U/L (ref 40–150)
Anion Gap: 10 meq/L (ref 3–11)
BUN: 18.2 mg/dL (ref 7.0–26.0)
CO2: 24 meq/L (ref 22–29)
Calcium: 9 mg/dL (ref 8.4–10.4)
Chloride: 105 meq/L (ref 98–109)
Creatinine: 0.8 mg/dL (ref 0.6–1.1)
EGFR: 79 ml/min/1.73 m2 — ABNORMAL LOW
Glucose: 70 mg/dL (ref 70–140)
Potassium: 3.8 meq/L (ref 3.5–5.1)
Sodium: 139 meq/L (ref 136–145)
Total Bilirubin: 0.57 mg/dL (ref 0.20–1.20)
Total Protein: 7.1 g/dL (ref 6.4–8.3)

## 2014-10-13 LAB — CBC WITH DIFFERENTIAL/PLATELET
BASO%: 0.9 % (ref 0.0–2.0)
Basophils Absolute: 0 10e3/uL (ref 0.0–0.1)
EOS%: 5.2 % (ref 0.0–7.0)
Eosinophils Absolute: 0.2 10e3/uL (ref 0.0–0.5)
HCT: 31.5 % — ABNORMAL LOW (ref 34.8–46.6)
HGB: 10.3 g/dL — ABNORMAL LOW (ref 11.6–15.9)
LYMPH%: 14.9 % (ref 14.0–49.7)
MCH: 33.8 pg (ref 25.1–34.0)
MCHC: 32.7 g/dL (ref 31.5–36.0)
MCV: 103.5 fL — ABNORMAL HIGH (ref 79.5–101.0)
MONO#: 0.4 10e3/uL (ref 0.1–0.9)
MONO%: 8.1 % (ref 0.0–14.0)
NEUT#: 3.1 10e3/uL (ref 1.5–6.5)
NEUT%: 70.9 % (ref 38.4–76.8)
Platelets: 207 10e3/uL (ref 145–400)
RBC: 3.05 10e6/uL — ABNORMAL LOW (ref 3.70–5.45)
RDW: 18.5 % — ABNORMAL HIGH (ref 11.2–14.5)
WBC: 4.3 10e3/uL (ref 3.9–10.3)
lymph#: 0.6 10e3/uL — ABNORMAL LOW (ref 0.9–3.3)

## 2014-10-14 ENCOUNTER — Other Ambulatory Visit: Payer: Medicare Other

## 2014-10-17 ENCOUNTER — Ambulatory Visit (HOSPITAL_BASED_OUTPATIENT_CLINIC_OR_DEPARTMENT_OTHER): Payer: Medicare Other | Admitting: Hematology

## 2014-10-17 ENCOUNTER — Encounter: Payer: Self-pay | Admitting: Hematology

## 2014-10-17 ENCOUNTER — Telehealth: Payer: Self-pay | Admitting: Hematology

## 2014-10-17 VITALS — BP 130/74 | HR 77 | Temp 97.5°F | Resp 18 | Ht 67.0 in | Wt 128.8 lb

## 2014-10-17 DIAGNOSIS — D61810 Antineoplastic chemotherapy induced pancytopenia: Secondary | ICD-10-CM

## 2014-10-17 DIAGNOSIS — J189 Pneumonia, unspecified organism: Secondary | ICD-10-CM

## 2014-10-17 DIAGNOSIS — M810 Age-related osteoporosis without current pathological fracture: Secondary | ICD-10-CM | POA: Diagnosis not present

## 2014-10-17 DIAGNOSIS — C9000 Multiple myeloma not having achieved remission: Secondary | ICD-10-CM

## 2014-10-17 LAB — KAPPA/LAMBDA LIGHT CHAINS
Kappa free light chain: 3.73 mg/dL — ABNORMAL HIGH (ref 0.33–1.94)
Kappa:Lambda Ratio: 1.45 (ref 0.26–1.65)
Lambda Free Lght Chn: 2.57 mg/dL (ref 0.57–2.63)

## 2014-10-17 LAB — SPEP & IFE WITH QIG
ALPHA-1-GLOBULIN: 0.3 g/dL (ref 0.2–0.3)
Abnormal Protein Band1: 0.6 g/dL
Albumin ELP: 3.9 g/dL (ref 3.8–4.8)
Alpha-2-Globulin: 0.6 g/dL (ref 0.5–0.9)
Beta 2: 0.3 g/dL (ref 0.2–0.5)
Beta Globulin: 0.4 g/dL (ref 0.4–0.6)
GAMMA GLOBULIN: 1.8 g/dL — AB (ref 0.8–1.7)
IGM, SERUM: 43 mg/dL — AB (ref 52–322)
IgA: 143 mg/dL (ref 69–380)
IgG (Immunoglobin G), Serum: 1710 mg/dL — ABNORMAL HIGH (ref 690–1700)
Total Protein, Serum Electrophoresis: 7.3 g/dL (ref 6.1–8.1)

## 2014-10-17 LAB — UPEP/TP, 24-HR URINE
Collection Interval: 24 hours
TOTAL PROTEIN, URINE/DAY: 98 mg/d (ref 50–100)
Total Protein, Urine: 7 mg/dL
Total Volume, Urine: 1400 mL

## 2014-10-17 LAB — UIFE/LIGHT CHAINS/TP QN, 24-HR UR
ALPHA 2 UR: DETECTED — AB
Albumin, U: DETECTED
Alpha 1, Urine: DETECTED — AB
BETA UR: DETECTED — AB
GAMMA UR: DETECTED — AB
TIME-UPE24: 24 h
Total Protein, Urine-Ur/day: 98 mg/d (ref <150)
Total Protein, Urine: 7 mg/dL (ref 5–24)
VOLUME, URINE-UPE24: 1400 mL

## 2014-10-17 LAB — 24 HR URINE,KAPPA/LAMBDA LIGHT CHAINS
24H Urine Volume: 1400 mL/24 h
Measured Kappa Chain: 1.03 mg/dL (ref <2.00)
Measured Lambda Chain: <0.4 mg/dL (ref <2.00)
Total Kappa Chain: 14.42 mg/24 h

## 2014-10-17 NOTE — Telephone Encounter (Signed)
Confirm appointment for April.

## 2014-10-17 NOTE — Progress Notes (Signed)
[facility] ONCOLOGY OFFICE PROGRESS NOTE   Shannon ARTHUR, MD [address] [place] [address]  DIAGNOSIS: Multiple myeloma  CHIEF COMPLAINS: Follow-up of multiple myeloma  CURRENT TREATMENT:  Zometa 3.5 mg monthly started on 06/2013;  Revlimid 5 mg daily for 21 days then off for 7 days for maintenance for at least 2 years started on 09/02/2013.  Decreased to Revlimid 2.5 mg daily for 21 days then off for 7 days on 12/30/2013.     Multiple myeloma   05/26/2012 Initial Diagnosis Presenting IgG was 4,040 mg/dL on 06/05/2012 (IgA 35; IgM 34); kappa free light chain 34.4 mg/dL, lambda 0.00, kappa:lambda ratio of 34.75; SPEP with M-spike of 2.60.   06/30/2012 Imaging Numerous lytic lesions throughout the calvarium.  Lytic lesion within the left lateral scapula.  Findings most compatible with metastases or myeloma. Multiple mid and lower thoracic compression fractures.  Slight compression through the endplates at L3.   07/03/2012 Bone Marrow Biopsy Bone marrow biopsy showed 49% plasma cell. Normal classical cytogenetics; however, myeloma FISH panel showed 13q- (intermediate risk)       07/14/2012 - 07/27/2012 Radiation Therapy Palliative radiation 20 Gy over 10 fractions between to thoracic spine cord compression and symptomatic left scapula lytic lesion.   08/10/2012 -  Chemotherapy Started SQ Velcade once weekly, 3 weeks on, 1 weeks off; daily Revlimid d1-21, 7 days off; and Dexamethasone 40mg PO weekly.    02/12/2013 Bone Marrow Transplant Auto bone marrow transplant at [facility]     06/14/2013 Tumor Marker IgG  1830,  Kappa:lamba ratio, 1.69 (baseline was 34.75 or   M-spike 0.28 (baseline of 2.6 or  89.3% of baseline).     06/25/2013 -  Chemotherapy Started zometa 3.5 mg monthly    09/01/2013 -  Chemotherapy Maintenance therapy with revlimid 5mg daily for 14 days and then off for 7 (decreased from 21 days on and 7 days off based on neutropenia).    09/13/2013 - 09/17/2014  Hospital Admission Hospital admission for pneumonia   12/20/2013 Treatment Plan Change Maintenance therapy decreased to 2.5 mg daily for 21 days and then off for 7 days based on low counts.    01/25/2014 Tumor Marker IgG 1360 M spike 0.5   03/01/2014 - 03/05/2014 Hospital Admission [facility] with an inferior STEMI, received thrombolytic therapy, cath showed 60% prox RCA, mid-distal RCA 50%, Echo normal, no stents.   04/11/2014 -  Chemotherapy Continue Revlimid at 2.5 mg 3 weeks on 1 week off   08/08/2014 - 08/13/2014 Hospital Admission Hospital admission for pneumonia. Her Revlimid was held.   08/29/2014 -  Chemotherapy Maintenance Revlimid restarted   09/14/2014 - 09/17/2014 Hospital Admission Admitted for pneumonia after coming back from a trip in [place]. Revlimid was held again.   INTERVAL HISTORY:  Shannon Rush 73 y.o. female with a past medical history of MM as detailed above, s/p SCT is here for follow-up.   She finished her course of antibiotics doxycycline about 10 days ago. She overall feels much better. Her cough is nearly resolved. She denies any chest pain or dyspnea on exertion. Her energy level and appetite has improved, and back to her baseline. She denies any pain, fever chills or any other new symptoms.  MEDICAL HISTORY: Past Medical History  Diagnosis Date  . Multiple myeloma 07/01/2012  . Unspecified deficiency anemia   . OSTEOPOROSIS 06/11/2010    Multiple compression fractures; and spontaneous fracture of sternum Qualifier: Diagnosis of  By:   [name], [name]     . 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 Cord compression 07/13/12    MRI- diffuse myeloma involvement of T-L spine  . History of radiation therapy 07/13/12-07/27/12    spinal cord compression T3-T10,left scapula  . CAD (coronary artery disease)     a. inf STEMI (in [place] >>> [facility]ytics) >>> LHC (8/15- [place]):  pRCA 60%, mid to dist RCA 50% >>> med rx  . Hx  of echocardiogram     Echo (03/02/14-[facility]):  Normal LVEF without significant regional wall motion abnormalities. Mild      ALLERGIES:  is allergic to zosyn; zithromax; and ciprofloxacin.  MEDICATIONS: has a current medication list which includes the following prescription(s): acetaminophen, atorvastatin, calcium carbonate, clopidogrel, metoprolol succinate, vitamin d (cholecalciferol), morphine, proair hfa, and revlimid.  SURGICAL HISTORY:  Past Surgical History  Procedure Laterality Date  . Appendectomy    . Tubal ligation    . Cesarean section      x2   . Hip surgery  2009    left  . Elbow surgery    . Colonoscopy  2007    neg with Dr. [name]    REVIEW OF SYSTEMS:   Constitutional: Denies abnormal weight loss, (+) fatigue Eyes: Denies blurriness of vision Ears, nose, mouth, throat, and face: Denies mucositis or sore throat, (+) left tooth pain  Respiratory: Denies cough, dyspnea or wheezes Cardiovascular: Denies palpitation, chest discomfort or lower extremity swelling Gastrointestinal:  Denies nausea, heartburn or change in bowel habits Skin: Denies abnormal skin rashes Lymphatics: Denies new lymphadenopathy or easy bruising Neurological:Denies numbness, tingling or new weaknesses Behavioral/Psych: Mood is stable, no new changes  All other systems were reviewed with the patient and are negative.  PHYSICAL EXAMINATION: ECOG PERFORMANCE STATUS: 0  Blood pressure 130/74, pulse 77, temperature 97.5 F (36.4 C), temperature source Oral, resp. rate 18, height 5' 7" (1.702 m), weight 128 lb 12.8 oz (58.423 kg), SpO2 96 %.  GENERAL:alert, no distress and comfortable; easily mobile to exam table; kyphosis, moderate.  SKIN: skin color, texture, turgor are normal, no rashes or significant lesions; Abrasion well healed on RLE.  EYES: normal, Conjunctiva are pink, 1 tear/injectionof blood vessel left eye in sclera. OROPHARYNX:no exudate, no  erythema and lips, buccal mucosa, and tongue normal, no ONJ NECK: supple, thyroid normal size, non-tender, without nodularity; nodules cervical bilaterally.  LYMPH:  no palpable lymphadenopathy in the cervical, axillary or supraclavicular LUNGS: crackles at the bases bilaterally with normal breathing effort, no wheezes or rhonchi HEART: regular rate & rhythm and no murmurs and no lower extremity edema ABDOMEN:abdomen soft, non-tender and normal bowel sounds Musculoskeletal:no cyanosis of digits and no clubbing  NEURO: alert & oriented x 3 with fluent speech, no focal motor/sensory deficits  Labs:  CBC Latest Ref Rng 10/13/2014 09/28/2014 09/23/2014  WBC 3.9 - 10.3 10e3/uL 4.3 3.7(L) 3.8(L)  Hemoglobin 11.6 - 15.9 g/dL 10.3(L) 9.5(L) 9.7(L)  Hematocrit 34.8 - 46.6 % 31.5(L) 29.6(L) 29.4(L)  Platelets 145 - 400 10e3/uL 207 268 211    CMP Latest Ref Rng 10/13/2014 09/28/2014 09/23/2014  Glucose 70 - 140 mg/dl 70 73 81  BUN 7.0 - 26.0 mg/dL 18.2 21.2 15  Creatinine 0.6 - 1.1 mg/dL 0.8 0.8 0.66  Sodium 136 - 145 mEq/L 139 139 137  Potassium 3.5 - 5.1 mEq/L 3.8 4.3 5.2  Chloride 96 - 112 mEq/L - - 104  CO2 22 -  29 mEq/L _0 Calcium 8.4 - 10.4 mg/dL 9.0 8.5 8.7  Total Protein 6.4 - 8.3 g/dL 7.1 6.7 6.4  Total Bilirubin 0.20 - 1.20 mg/dL 0.57 0.42 0.5  Alkaline Phos 40 - 150 U/L 65 87 102  AST 5 - 34 U/L _1 ALT 0 - 55 U/L _2 RADIOGRAPHIC STUDIES: None  ASSESSMENT: Shannon Rush 74 y.o. female with a history of Multiple Myeloma s/p autotransplant in August 2014 now on maintenence low dose Revlimid therapy  PLAN:   1. IgG kappa Multiple myeloma s/p Auto SCT, intermediate risk.  --MM markers demonstrate a very good response (M protein baseline 2.6, down to 0.28 after induction chemo and transplant, stable around 0.5 since July 2015,  Most recent 0.6 on 10/13/2014)  -Her quantitative IgG and kappa light chain level has slightly increased comparing to 2 months ago -  Acyclovir has been stopped after 1 year of SCT.   -Revlimid was recently held due to her pneumonia. Giving her 2 episodes of severe pneumonia quite hospitalization and vent mask, I'll hold on Revlimid for 6-8 weeks this time until she completely recovery. She is critically recovering well, I would like her to have a repeat chest x-ray in to 3 weeks to see if her residual pleural effusion are resolved. -I'll see her back in 3 weeks and restart Revlimid if she continue doing well clinically. -Restart Zometa on next visit. She has had one tooth pulled a few month  Ago, no other pending dental work.  2. Recurrent pneumonia -She will follow-up with her primary care physician and have a repeat chest x-ray in 2-3 weeks  2. Left lower premolar tooth infection -She has had the tooth extraction. -Restart Zometa on next visit   3. Pancytopenia due to chemotherapy. --   Her blood counts improved after she is off Revlimid.  3. Osteoporosis -recently repeated bone density scan showed worsening osteoporosis, will continue zometa monthly for additional 6 month then change to every 3 months, but will hold temporary for her dental procedure.    Plan -Refill Revlimid -Return to clinic in 3 weeks and to restart Revlimid -Zometa infusion on next visit  All questions were answered. The patient knows to call the clinic with any problems, questions or concerns. We can certainly see the patient much sooner if necessary.  I spent 20 minutes counseling the patient face to face. The total time spent in the appointment was 25 minutes.   [name]  10/17/2014

## 2014-10-18 ENCOUNTER — Other Ambulatory Visit: Payer: Self-pay | Admitting: Hematology

## 2014-10-18 ENCOUNTER — Other Ambulatory Visit: Payer: Self-pay | Admitting: *Deleted

## 2014-10-18 ENCOUNTER — Ambulatory Visit: Payer: Medicare Other | Admitting: Hematology

## 2014-10-18 DIAGNOSIS — C9000 Multiple myeloma not having achieved remission: Secondary | ICD-10-CM

## 2014-10-18 MED ORDER — REVLIMID 2.5 MG PO CAPS: 2.5000 mg | ORAL_CAPSULE | Freq: Every day | ORAL | Status: DC

## 2014-10-26 ENCOUNTER — Telehealth: Payer: Self-pay | Admitting: Family Medicine

## 2014-10-26 DIAGNOSIS — J189 Pneumonia, unspecified organism: Secondary | ICD-10-CM

## 2014-10-26 NOTE — Telephone Encounter (Signed)
Pt is calling and would like a chest xray. jw

## 2014-10-26 NOTE — Progress Notes (Signed)
Patient ID: Shannon Rush, female   DOB: December 22, 1940, 74 y.o.   MRN: [id number] 74 y.o.  F/u abnormal echo and hypercoagulable state on Rx for myeloma   Read report from 12/10/13   EF normal valves ok " linear mobile density in aortic root above aortic valve. She has not had any cardiac symptoms She has not had any TIA;s No clinical history of SBE, fevers. No history of connective tissue disease. She has been Rx for myeloma and is thinking about a BMT for this. She has no chest pain dyspnea or palpitation No history of vascular disease aneurysm or hypercoagulable state  Reviewed echo done at [facility] 8/15  With ? Lambel's excresence  Trivial MR/AR and normal EF with no HOCM.  Recommended to have TEE done at [facility] with agreement of above diagnosis and no SBE/Disection  Admitted 8/18-8/22 to the [facility] with an inferior STEMI. She received thrombolytics. Cardiac catheterization demonstrated 60% proximal RCA, mid to distal RCA 50% (lesion segmental and thrombotic). Echocardiogram demonstrated normal LV function. Patient was continued on heparin and aspirin. No intervention was performed. Recommendation was for the patient to return to [facility] and have a nuclear stress test in 6 weeks. Patient was on Revlimid (for pancytopenia from chemotherapy) and the DC summary from the hospital in [place] cites this as a possible cause for her STEMI.  She returns for FU. She is doing well. She denies chest pain. She is an avid bike rider. She has been back to cycling without chest discomfort or significant shortness of breath. She denies syncope. Denies orthopnea, PND or edema. She denies significant palpitations.  Studies:  - LHC (8/15- [place]): pRCA 60%, mid to dist RCA 50% >>> med rx  - Echo (03/02/14-done at [facility]): Normal LVEF without significant regional wall motion abnormalities. Mild  left atrial enlargement. No significant valvular  abnormalities.   Doing well ? Hypercoagulable on revlamid  Discussed coumadin but she and I prefer to maintain ASA/Plavix  She is riding her bike everywhere again   Admitted to hospital with pneumonia 08/2014 after trip to [name]  Will restart revlamid in May   ROS: Denies fever, malais, weight loss, blurry vision, decreased visual acuity, cough, sputum, SOB, hemoptysis, pleuritic pain, palpitaitons, heartburn, abdominal pain, melena, lower extremity edema, claudication, or rash.  All other systems reviewed and negative  General: Affect appropriate Healthy:  appears stated age Kyphotic  HEENT: normal Neck supple with no adenopathy JVP normal no bruits no thyromegaly Lungs clear with no wheezing and good diaphragmatic motion Heart:  S1/S2 no murmur, no rub, gallop or click PMI normal Abdomen: benighn, BS positve, no tenderness, no AAA no bruit.  No HSM or HJR Distal pulses intact with no bruits No edema Neuro non-focal Skin warm and dry No muscular weakness   Current Outpatient Prescriptions  Medication Sig Dispense Refill  . acetaminophen (TYLENOL) 500 MG tablet Take 500 mg by mouth every 6 (six) hours as needed for moderate pain (pain).    Marland Kitchen atorvastatin (LIPITOR) 40 MG tablet Take 1 tablet (40 mg total) by mouth daily. 30 tablet 6  . calcium carbonate (OS-CAL) 600 MG TABS tablet Take 600 mg by mouth daily.    . clopidogrel (PLAVIX) 75 MG tablet Take 1 tablet (75 mg total) by mouth daily. 30 tablet 6  . metoprolol succinate (TOPROL-XL) 25 MG 24 hr tablet Take 25 mg by mouth daily.  6  . morphine (  MSIR) 15 MG tablet Take 1 tablet (15 mg total) by mouth every 6 (six) hours as needed for severe pain. 30 tablet 0  . PROAIR HFA 108 (90 BASE) MCG/ACT inhaler Inhale 2 puffs into the lungs every 6 (six) hours as needed.  2  . [START ON 11/07/2014] REVLIMID 2.5 MG capsule Take 1 capsule (2.5 mg total) by mouth daily. Take 1 capsule  2.5 mg  by mouth daily for 21 days, then rest 7  days. 21 capsule 0  . vitamin D, CHOLECALCIFEROL, 400 UNITS tablet Take 400 Units by mouth daily.     No current facility-administered medications for this visit.    Allergies  Zosyn; Zithromax; and Ciprofloxacin  Electrocardiogram:   03/04/14  SR marked inferolateral T wave inversions  10/27/14  SR rate 78 normal   Assessment and Plan

## 2014-10-26 NOTE — Telephone Encounter (Signed)
Agree needs FU XR to insure complete clearing.

## 2014-10-27 ENCOUNTER — Encounter: Payer: Self-pay | Admitting: Cardiovascular Disease

## 2014-10-27 ENCOUNTER — Ambulatory Visit (INDEPENDENT_AMBULATORY_CARE_PROVIDER_SITE_OTHER): Payer: Medicare Other | Admitting: Cardiovascular Disease

## 2014-10-27 VITALS — BP 120/78 | HR 77 | Ht 67.0 in | Wt 128.0 lb

## 2014-10-27 DIAGNOSIS — I2510 Atherosclerotic heart disease of native coronary artery without angina pectoris: Secondary | ICD-10-CM

## 2014-10-27 DIAGNOSIS — J189 Pneumonia, unspecified organism: Secondary | ICD-10-CM | POA: Diagnosis not present

## 2014-10-27 DIAGNOSIS — Z949 Transplanted organ and tissue status, unspecified: Secondary | ICD-10-CM

## 2014-10-27 NOTE — Patient Instructions (Signed)
Medication Instructions:  NO CHANGE  Labwork: NONE  Testing/Procedures: NONE  Follow-Up: YEAR WITH  DR Johnsie Cancel  Any Other Special Instructions Will Be Listed Below (If Applicable).

## 2014-10-27 NOTE — Assessment & Plan Note (Signed)
Resolved clear lung fields  WBC rebounding no revlamid until May

## 2014-10-27 NOTE — Assessment & Plan Note (Signed)
F/U oncology  revlamid to start May

## 2014-10-27 NOTE — Assessment & Plan Note (Signed)
Stable with no angina and good activity level.  Continue medical Rx continue asa and plavix as IMI thought to be from hypercoagulable state

## 2014-11-02 ENCOUNTER — Ambulatory Visit
Admission: RE | Admit: 2014-11-02 | Discharge: 2014-11-02 | Disposition: A | Discharge status: Home / Self Care | Payer: Medicare Other | Source: Ambulatory Visit | Attending: Family Medicine | Admitting: Family Medicine

## 2014-11-02 DIAGNOSIS — J189 Pneumonia, unspecified organism: Secondary | ICD-10-CM

## 2014-11-04 ENCOUNTER — Encounter: Payer: Self-pay | Admitting: Family Medicine

## 2014-11-04 ENCOUNTER — Ambulatory Visit (INDEPENDENT_AMBULATORY_CARE_PROVIDER_SITE_OTHER): Payer: Medicare Other | Admitting: Family Medicine

## 2014-11-04 VITALS — BP 124/65 | HR 82 | Temp 97.7°F | Ht 67.0 in | Wt 129.8 lb

## 2014-11-04 DIAGNOSIS — L84 Corns and callosities: Secondary | ICD-10-CM

## 2014-11-04 DIAGNOSIS — Z23 Encounter for immunization: Secondary | ICD-10-CM | POA: Diagnosis not present

## 2014-11-04 NOTE — Assessment & Plan Note (Signed)
Trimmed and frozen.  Instructed on care.  Return prn this problem

## 2014-11-04 NOTE — Progress Notes (Signed)
   Subjective:    Patient ID: Shannon Rush, female    DOB: 11-04-1940, 74 y.o.   MRN: [id number]  HPI Has sore left foot.  Started after walking on beach.  Wonders if there is a retained FB such as an urchin spine.  No fever, redness. Present for over one month.  Due for mammogram.  She will scheduled. Showing due for second pneumonia vaccine.    Review of Systems     Objective:   Physical Exam Hyperkaratotic area on plantar surface at the head of the fourth metatarsal.  Trimmed - no FB seen.  No pinpoint bleeding suggestive of wart.  Clinically, a non infected corn       Assessment & Plan:

## 2014-11-04 NOTE — Patient Instructions (Signed)
Use corn pads and keep the 'corn" clean.  I will be happy to trim again if it grows back.

## 2014-11-07 ENCOUNTER — Other Ambulatory Visit (HOSPITAL_BASED_OUTPATIENT_CLINIC_OR_DEPARTMENT_OTHER): Payer: Medicare Other

## 2014-11-07 ENCOUNTER — Telehealth: Payer: Self-pay | Admitting: Hematology

## 2014-11-07 ENCOUNTER — Ambulatory Visit (HOSPITAL_BASED_OUTPATIENT_CLINIC_OR_DEPARTMENT_OTHER): Payer: Medicare Other

## 2014-11-07 ENCOUNTER — Encounter: Payer: Self-pay | Admitting: Hematology

## 2014-11-07 ENCOUNTER — Ambulatory Visit (HOSPITAL_BASED_OUTPATIENT_CLINIC_OR_DEPARTMENT_OTHER): Payer: Medicare Other | Admitting: Hematology

## 2014-11-07 VITALS — BP 123/70 | HR 94 | Temp 98.5°F | Resp 18 | Ht 67.0 in | Wt 130.1 lb

## 2014-11-07 DIAGNOSIS — J189 Pneumonia, unspecified organism: Secondary | ICD-10-CM | POA: Diagnosis not present

## 2014-11-07 DIAGNOSIS — D61811 Other drug-induced pancytopenia: Secondary | ICD-10-CM | POA: Diagnosis not present

## 2014-11-07 DIAGNOSIS — M810 Age-related osteoporosis without current pathological fracture: Secondary | ICD-10-CM | POA: Diagnosis not present

## 2014-11-07 DIAGNOSIS — C9000 Multiple myeloma not having achieved remission: Secondary | ICD-10-CM

## 2014-11-07 LAB — CBC WITH DIFFERENTIAL/PLATELET
BASO%: 0.5 % (ref 0.0–2.0)
BASOS ABS: 0 10*3/uL (ref 0.0–0.1)
EOS ABS: 0.1 10*3/uL (ref 0.0–0.5)
EOS%: 2.7 % (ref 0.0–7.0)
HEMATOCRIT: 32.8 % — AB (ref 34.8–46.6)
HEMOGLOBIN: 10.8 g/dL — AB (ref 11.6–15.9)
LYMPH#: 0.6 10*3/uL — AB (ref 0.9–3.3)
LYMPH%: 16.6 % (ref 14.0–49.7)
MCH: 35 pg — ABNORMAL HIGH (ref 25.1–34.0)
MCHC: 32.9 g/dL (ref 31.5–36.0)
MCV: 106.1 fL — ABNORMAL HIGH (ref 79.5–101.0)
MONO#: 0.4 10*3/uL (ref 0.1–0.9)
MONO%: 11 % (ref 0.0–14.0)
NEUT%: 69.2 % (ref 38.4–76.8)
NEUTROS ABS: 2.6 10*3/uL (ref 1.5–6.5)
Platelets: 173 10*3/uL (ref 145–400)
RBC: 3.09 10*6/uL — AB (ref 3.70–5.45)
RDW: 17.1 % — AB (ref 11.2–14.5)
WBC: 3.7 10*3/uL — ABNORMAL LOW (ref 3.9–10.3)

## 2014-11-07 LAB — COMPREHENSIVE METABOLIC PANEL (CC13)
ALBUMIN: 3.7 g/dL (ref 3.5–5.0)
ALT: 23 U/L (ref 0–55)
AST: 28 U/L (ref 5–34)
Alkaline Phosphatase: 67 U/L (ref 40–150)
Anion Gap: 12 mEq/L — ABNORMAL HIGH (ref 3–11)
BILIRUBIN TOTAL: 0.62 mg/dL (ref 0.20–1.20)
BUN: 14.9 mg/dL (ref 7.0–26.0)
CALCIUM: 8.9 mg/dL (ref 8.4–10.4)
CO2: 20 mEq/L — ABNORMAL LOW (ref 22–29)
Chloride: 106 mEq/L (ref 98–109)
Creatinine: 0.9 mg/dL (ref 0.6–1.1)
EGFR: 67 mL/min/{1.73_m2} — AB (ref >90)
Glucose: 101 mg/dl (ref 70–140)
POTASSIUM: 4.1 meq/L (ref 3.5–5.1)
Sodium: 139 mEq/L (ref 136–145)
TOTAL PROTEIN: 7.2 g/dL (ref 6.4–8.3)

## 2014-11-07 MED ORDER — ZOLEDRONIC ACID 4 MG/5ML IV CONC
3.5000 mg | Freq: Once | INTRAVENOUS | Status: AC
  Administered 2014-11-07: 3.5 mg via INTRAVENOUS
  Filled 2014-11-07: qty 4.38

## 2014-11-07 MED ORDER — SODIUM CHLORIDE 0.9 % IV SOLN
Freq: Once | INTRAVENOUS | Status: AC
  Administered 2014-11-07: 11:00:00 via INTRAVENOUS

## 2014-11-07 NOTE — Patient Instructions (Signed)

## 2014-11-07 NOTE — Progress Notes (Signed)
[name], MD Shannon Rush [place] [address]  DIAGNOSIS: Multiple myeloma  CHIEF COMPLAINS: Follow-up of multiple myeloma  CURRENT TREATMENT:  Zometa 3.5 mg monthly started on 06/2013;  Revlimid 5 mg daily for 21 days then off for 7 days for maintenance for at least 2 years started on 09/02/2013.  Decreased to Revlimid 2.5 mg daily for 21 days then off for 7 days on 12/30/2013.     Multiple myeloma   05/26/2012 Initial Diagnosis Presenting IgG was 4,040 mg/dL on 06/05/2012 (IgA 35; IgM 34); kappa free light chain 34.4 mg/dL, lambda 0.00, kappa:lambda ratio of 34.75; SPEP with M-spike of 2.60.   12/73/2013 Imaging Numerous lytic lesions throughout the calvarium.  Lytic lesion within the left lateral scapula.  Findings most compatible with metastases or myeloma. Multiple mid and lower thoracic compression fractures.  Slight compression through the endplates at L3.   48/25/0037 Bone Marrow Biopsy Bone marrow biopsy showed 49% plasma cell. Normal classical cytogenetics; however, myeloma FISH panel showed 13q- (intermediate risk)       07/14/2012 - 73/13/2014 Radiation Therapy Palliative radiation 20 Gy over 10 fractions between to thoracic spine cord compression and symptomatic left scapula lytic lesion.   1/73/2014 -  Chemotherapy Started SQ Velcade once weekly, 3 weeks on, 1 weeks off; daily Revlimid d1-21, 7 days off; and Dexamethasone 51m PO weekly.    [facility]    06/14/2013 Tumor Marker IgG  1830,  Kappa:lamba ratio, 1.69 (baseline was 34.75 or   M-spike 0.28 (baseline of 2.6 or  89.3% of baseline).     06/25/72 -  Chemotherapy Started zometa 3.5 mg monthly    09/01/2013 -  Chemotherapy Maintenance therapy with revlimid 581mdaily for 14 days and then off for 7 (decreased from 21 days on and 7 days off based on neutropenia).    09/13/2013 - 09/17/2014  Hospital Admission Hospital admission for pneumonia   6/73/2015 Treatment Plan Change Maintenance therapy decreased to 2.5 mg daily for 21 days and then off for 7 days based on low counts.    7/73/2015 Tumor Marker IgG 1360 M spike 0.5   8/73/2015 - 03/05/2014 [facility], received thrombolytic therapy, cath showed 60% prox RCA, mid-distal RCA 50%, Echo normal, no stents.   9/73/2015 -  Chemotherapy Continue Revlimid at 2.5 mg 3 weeks on 1 week off   1/73/2016 - 08/13/2014 Hospital Admission Hospital admission for pneumonia. Her Revlimid was held.   2/73/2016 -  Chemotherapy Maintenance Revlimid restarted   3/73/2016 - 09/17/2014 Hospital Admission [place] pneumonia [place].   INTERVAL HISTORY:  ClJARELY JUNCAJ89.o. female with a past medical history of MM as detailed above, s/p SCT is here for follow-up.   She history very well clinically. She denies any pain, cough, shortness of breath or other symptoms. She had a repeated status x-ray last week which was normal. No fever or chills. She has good energy level and appetite.  MEDICAL HISTORY: Past Medical History  Diagnosis Date  . Multiple myeloma 07/01/2012  . Unspecified deficiency anemia   . OSTEOPOROSIS 06/11/2010    Multiple compression fractures; and spontaneous fracture of sternum Qualifier: Diagnosis of  By: SaZebedee IbaP, SuManuela Schwartz   . Thoracic kyphosis 07/13/12    per  MRI scan  . Cord compression 07/13/12    MRI- diffuse myeloma involvement of T-L spine  . History of radiation therapy 07/13/12-07/27/12    spinal cord compression T3-T10,left scapula  . CAD (coronary artery disease)     a. inf STEMI (in [place] >>> [facility]ytics) >>> LHC (8/15- [place]):  pRCA 60%, mid to dist RCA 50% >>> med rx  . Hx of echocardiogram     Echo (03/02/14-done at [facility]):   Normal LVEF without significant regional wall motion abnormalities. Mild      ALLERGIES:  is allergic to zosyn; zithromax; and ciprofloxacin.  MEDICATIONS: has a current medication list which includes the following prescription(s): acetaminophen, atorvastatin, calcium carbonate, clopidogrel, metoprolol succinate, proair hfa, vitamin d (cholecalciferol), morphine, and revlimid, and the following Facility-Administered Medications: zolendronic acid (ZOMETA) 3.5 mg in sodium chloride 0.9 % 100 mL IVPB.  SURGICAL HISTORY:  Past Surgical History  Procedure Laterality Date  . Appendectomy    . Tubal ligation    . Cesarean section      x2   . Hip surgery  2009    left  . Elbow surgery    . Colonoscopy  2007    neg with Dr. [name]    REVIEW OF SYSTEMS:   Constitutional: Denies abnormal weight loss, (+) fatigue Eyes: Denies blurriness of vision Ears, nose, mouth, throat, and face: Denies mucositis or sore throat, (+) left tooth pain  Respiratory: Denies cough, dyspnea or wheezes Cardiovascular: Denies palpitation, chest discomfort or lower extremity swelling Gastrointestinal:  Denies nausea, heartburn or change in bowel habits Skin: Denies abnormal skin rashes Lymphatics: Denies new lymphadenopathy or easy bruising Neurological:Denies numbness, tingling or new weaknesses Behavioral/Psych: Mood is stable, no new changes  All other systems were reviewed with the patient and are negative.  PHYSICAL EXAMINATION: ECOG PERFORMANCE STATUS: 0  Blood pressure 123/70, pulse 94, temperature 98.5 F (36.9 C), temperature source Oral, resp. rate 18, height 5' 7" (1.702 m), weight 130 lb 1.6 oz (59.013 kg), SpO2 98 %.  GENERAL:alert, no distress and comfortable; easily mobile to exam table; kyphosis, moderate.  SKIN: skin color, texture, turgor are normal, no rashes or significant lesions; Abrasion well healed on RLE.  EYES: normal, Conjunctiva are pink, 1 tear/injectionof blood vessel left eye in  sclera. OROPHARYNX:no exudate, no erythema and lips, buccal mucosa, and tongue normal, no ONJ NECK: supple, thyroid normal size, non-tender, without nodularity; nodules cervical bilaterally.  LYMPH:  no palpable lymphadenopathy in the cervical, axillary or supraclavicular LUNGS: crackles at the bases bilaterally with normal breathing effort, no wheezes or rhonchi HEART: regular rate & rhythm and no murmurs and no lower extremity edema ABDOMEN:abdomen soft, non-tender and normal bowel sounds Musculoskeletal:no cyanosis of digits and no clubbing  NEURO: alert & oriented x 3 with fluent speech, no focal motor/sensory deficits  Labs:  CBC Latest Ref Rng 11/07/2014 10/13/2014 09/28/2014  WBC 3.9 - 10.3 10e3/uL 3.7(L) 4.3 3.7(L)  Hemoglobin 11.6 - 15.9 g/dL 10.8(L) 10.3(L) 9.5(L)  Hematocrit 34.8 - 46.6 % 32.8(L) 31.5(L) 29.6(L)  Platelets 145 - 400 10e3/uL 173 207 268    CMP Latest Ref Rng 11/07/2014 10/13/2014 09/28/2014  Glucose 70 - 140 mg/dl 101 70 73  BUN 7.0 - 26.0 mg/dL 14.9 18.2 21.2  Creatinine 0.6 - 1.1 mg/dL 0.9 0.8 0.8  Sodium 136 - 145 mEq/L 139 139 139  Potassium 3.5 - 5.1 mEq/L 4.1 3.8 4.3  Chloride 96 - 112 mEq/L - - -  CO2 22 - 29 mEq/L 20(L) 24 25  Calcium 8.4 - 10.4 mg/dL 8.9 9.0 8.5  Total Protein 6.4 - 8.3 g/dL 7.2 7.1 6.7  Total Bilirubin 0.20 - 1.20 mg/dL 0.62 0.57 0.42  Alkaline Phos 40 - 150 U/L 67 65 87  AST 5 - 34 U/L _0 ALT 0 - 55 U/L _1 RADIOGRAPHIC STUDIES: None  ASSESSMENT: Shannon Rush 74 y.o. female with a history of Multiple Myeloma s/p autotransplant in August 2014 now on maintenence low dose Revlimid therapy  PLAN:   1. IgG kappa Multiple myeloma s/p Auto SCT, intermediate risk.  --MM markers demonstrate a very good response (M protein baseline 2.6, down to 0.28 after induction chemo and transplant, stable around 0.5 since July 2015,  Most recent 0.6 on 10/13/2014)  -Her quantitative IgG and kappa light chain level has slightly  increased comparing to 2 months ago - Acyclovir has been stopped after 1 year of SCT.   -Revlimid was recently held due to her pneumonia. Giving her 2 episodes of severe pneumonia quite hospitalization and vent mask, I'll hold on Revlimid for 2 month until she completely recovery. -she is clinically doing very well, repeat a chest x-ray is normal. -Restart Revlimid next Monday, 11/14/2014  2. Recurrent pneumonia -Resolved. Repeated chest x-ray on 11/02/2014 was normal.  2. Left lower premolar tooth infection -She has had the tooth extraction. -Restart Zometa today and continue monthly   3. Pancytopenia due to chemotherapy. --   Her blood counts improved after she is off Revlimid.  3. Osteoporosis -recently repeated bone density scan showed worsening osteoporosis, will continue zometa monthly for additional 6 month then change to every 3 months.    Plan -Restart Revlimid on 11/14/2014 -Zometa infusion today, And continue monthly  -follow-up with a APP in 2-3 weeks, and asked me back in 5 weeks before next cycle.  All questions were answered. The patient knows to call the clinic with any problems, questions or concerns. We can certainly see the patient much sooner if necessary.  I spent 20 minutes counseling the patient face to face. The total time spent in the appointment was 25 minutes.   [name]  11/07/2014

## 2014-11-07 NOTE — Progress Notes (Signed)
Patient has no future appointments scheduled at this time. Patient wants to call and schedule at a later date. MD's plan reviewed with pt.

## 2014-11-07 NOTE — Telephone Encounter (Signed)
lvm for pt regarding to May appt....mailed pt appt sched avs and letter °

## 2014-11-10 DIAGNOSIS — Z1231 Encounter for screening mammogram for malignant neoplasm of breast: Secondary | ICD-10-CM | POA: Diagnosis not present

## 2014-11-11 ENCOUNTER — Telehealth: Payer: Self-pay | Admitting: Hematology

## 2014-11-11 NOTE — Telephone Encounter (Signed)
returned call and s.w. patient and confirmed appts. °

## 2014-11-15 DIAGNOSIS — M6281 Muscle weakness (generalized): Secondary | ICD-10-CM | POA: Diagnosis not present

## 2014-11-16 ENCOUNTER — Other Ambulatory Visit: Payer: Self-pay

## 2014-11-16 MED ORDER — ATORVASTATIN CALCIUM 40 MG PO TABS: 40.0000 mg | ORAL_TABLET | Freq: Every day | ORAL | Status: DC

## 2014-11-16 MED ORDER — METOPROLOL SUCCINATE ER 25 MG PO TB24: 25.0000 mg | ORAL_TABLET | Freq: Every day | ORAL | Status: DC

## 2014-11-16 MED ORDER — CLOPIDOGREL BISULFATE 75 MG PO TABS: 75.0000 mg | ORAL_TABLET | Freq: Every day | ORAL | Status: DC

## 2014-11-21 ENCOUNTER — Ambulatory Visit (HOSPITAL_BASED_OUTPATIENT_CLINIC_OR_DEPARTMENT_OTHER): Payer: Self-pay | Admitting: Oncology

## 2014-11-21 ENCOUNTER — Encounter: Payer: Self-pay | Admitting: Oncology

## 2014-11-21 ENCOUNTER — Other Ambulatory Visit (HOSPITAL_BASED_OUTPATIENT_CLINIC_OR_DEPARTMENT_OTHER): Payer: Medicare Other

## 2014-11-21 VITALS — BP 104/59 | HR 74 | Temp 97.8°F | Resp 16 | Wt 129.5 lb

## 2014-11-21 DIAGNOSIS — D61810 Antineoplastic chemotherapy induced pancytopenia: Secondary | ICD-10-CM

## 2014-11-21 DIAGNOSIS — C9000 Multiple myeloma not having achieved remission: Secondary | ICD-10-CM

## 2014-11-21 DIAGNOSIS — Z946 Bone transplant status: Secondary | ICD-10-CM

## 2014-11-21 DIAGNOSIS — M810 Age-related osteoporosis without current pathological fracture: Secondary | ICD-10-CM

## 2014-11-21 LAB — CBC WITH DIFFERENTIAL/PLATELET
BASO%: 1 % (ref 0.0–2.0)
Basophils Absolute: 0 10*3/uL (ref 0.0–0.1)
EOS%: 3.7 % (ref 0.0–7.0)
Eosinophils Absolute: 0.1 10*3/uL (ref 0.0–0.5)
HCT: 30.9 % — ABNORMAL LOW (ref 34.8–46.6)
HEMOGLOBIN: 10 g/dL — AB (ref 11.6–15.9)
LYMPH%: 17.3 % (ref 14.0–49.7)
MCH: 34.2 pg — AB (ref 25.1–34.0)
MCHC: 32.4 g/dL (ref 31.5–36.0)
MCV: 105.8 fL — ABNORMAL HIGH (ref 79.5–101.0)
MONO#: 0.3 10*3/uL (ref 0.1–0.9)
MONO%: 9.5 % (ref 0.0–14.0)
NEUT#: 2 10*3/uL (ref 1.5–6.5)
NEUT%: 68.5 % (ref 38.4–76.8)
Platelets: 163 10*3/uL (ref 145–400)
RBC: 2.92 10*6/uL — ABNORMAL LOW (ref 3.70–5.45)
RDW: 16.5 % — ABNORMAL HIGH (ref 11.2–14.5)
WBC: 3 10*3/uL — AB (ref 3.9–10.3)
lymph#: 0.5 10*3/uL — ABNORMAL LOW (ref 0.9–3.3)

## 2014-11-21 LAB — COMPREHENSIVE METABOLIC PANEL (CC13)
ALT: 23 U/L (ref 0–55)
AST: 25 U/L (ref 5–34)
Albumin: 3.6 g/dL (ref 3.5–5.0)
Alkaline Phosphatase: 61 U/L (ref 40–150)
Anion Gap: 9 mEq/L (ref 3–11)
BUN: 14.3 mg/dL (ref 7.0–26.0)
CALCIUM: 8.5 mg/dL (ref 8.4–10.4)
CHLORIDE: 108 meq/L (ref 98–109)
CO2: 24 meq/L (ref 22–29)
CREATININE: 0.8 mg/dL (ref 0.6–1.1)
EGFR: 77 mL/min/{1.73_m2} — ABNORMAL LOW (ref >90)
Glucose: 84 mg/dl (ref 70–140)
POTASSIUM: 3.8 meq/L (ref 3.5–5.1)
SODIUM: 141 meq/L (ref 136–145)
TOTAL PROTEIN: 6.7 g/dL (ref 6.4–8.3)
Total Bilirubin: 0.63 mg/dL (ref 0.20–1.20)

## 2014-11-21 NOTE — Progress Notes (Signed)
[place], MD [place] [address]  DIAGNOSIS: Multiple myeloma  CHIEF COMPLAINS: Follow-up of multiple myeloma  CURRENT TREATMENT:  Zometa 3.5 mg monthly started on 06/2013;  Revlimid 5 mg daily for 21 days then off for 7 days for maintenance for at least 2 years started on 09/02/2013.  Decreased to Revlimid 2.5 mg daily for 21 days then off for 7 days on 12/30/2013.     Multiple myeloma   05/26/2012 Initial Diagnosis Presenting IgG was 4,040 mg/dL on 06/05/2012 (IgA 35; IgM 34); kappa free light chain 34.4 mg/dL, lambda 0.00, kappa:lambda ratio of 34.75; SPEP with M-spike of 2.60.   06/30/2012 Imaging Numerous lytic lesions throughout the calvarium.  Lytic lesion within the left lateral scapula.  Findings most compatible with metastases or myeloma. Multiple mid and lower thoracic compression fractures.  Slight compression through the endplates at L3.   75/17/0017 Bone Marrow Biopsy Bone marrow biopsy showed 49% plasma cell. Normal classical cytogenetics; however, myeloma FISH panel showed 13q- (intermediate risk)       07/14/2012 - 07/27/2012 Radiation Therapy Palliative radiation 20 Gy over 10 fractions between to thoracic spine cord compression and symptomatic left scapula lytic lesion.   08/10/2012 -  Chemotherapy Started SQ Velcade once weekly, 3 weeks on, 1 weeks off; daily Revlimid d1-21, 7 days off; and Dexamethasone 95m PO weekly.    02[facility]12[facility]    06/14/2013 Tumor Marker IgG  1830,  Kappa:lamba ratio, 1.69 (baseline was 34.75 or   M-spike 0.28 (baseline of 2.6 or  89.3% of baseline).     06/25/2013 -  Chemotherapy Started zometa 3.5 mg monthly    09/01/2013 -  Chemotherapy Maintenance therapy with revlimid 582mdaily for 14 days and then off for 7 (decreased from 21 days on and 7 days off based on neutropenia).    09/13/2013 - 09/17/2014  Hospital Admission Hospital admission for pneumonia   12/20/2013 Treatment Plan Change Maintenance therapy decreased to 2.5 mg daily for 21 days and then off for 7 days based on low counts.    01/25/2014 Tumor Marker IgG 1360 M spike 0.5   03/01/2014 - 03/05/2014 [facility], received thrombolytic therapy, cath showed 60% prox RCA, mid-distal RCA 50%, Echo normal, no stents.   04/11/2014 -  Chemotherapy Continue Revlimid at 2.5 mg 3 weeks on 1 week off   08/08/2014 - 08/13/2014 Hospital Admission Hospital admission for pneumonia. Her Revlimid was held.   08/29/2014 -  Chemotherapy Maintenance Revlimid restarted   09/14/2014 - 09/17/2014 Hospital Admission [place] pneumonia [place].   INTERVAL HISTORY:  Shannon CHANDRA89.o. female with a past medical history of MM as detailed above, s/p SCT is here for follow-up.   She resumed Revlimid 2.5 mg on 11/13/14. Shefeels very well clinically. She denies any pain, cough, shortness of breath or other symptoms. No fever or chills. She has good energy level and appetite.  MEDICAL HISTORY: Past Medical History  Diagnosis Date  . Multiple myeloma 07/01/2012  . Unspecified deficiency anemia   . OSTEOPOROSIS 06/11/2010    Multiple compression fractures; and spontaneous fracture of sternum Qualifier: Diagnosis of  By: SaZebedee IbaP, SuManuela Schwartz   . 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 Cord  compression 07/13/12    MRI- diffuse myeloma involvement of T-L spine  . History of radiation therapy 07/13/12-07/27/12    spinal cord compression T3-T10,left scapula  . CAD (coronary artery disease)     a. inf STEMI (in [place] >>> [facility]) >>> LHC (8/15- [place]):  pRCA 60%, mid to dist RCA 50% >>> med rx  . Hx of echocardiogram     Echo (03/02/14-[facility] at [facility]):  Normal LVEF without  significant regional wall motion abnormalities. Mild      ALLERGIES:  is allergic to zosyn; zithromax; and ciprofloxacin.  MEDICATIONS: has a current medication list which includes the following prescription(s): acetaminophen, atorvastatin, calcium carbonate, clopidogrel, metoprolol succinate, morphine, proair hfa, revlimid, and vitamin d (cholecalciferol).  SURGICAL HISTORY:  Past Surgical History  Procedure Laterality Date  . Appendectomy    . Tubal ligation    . Cesarean section      x2   . Hip surgery  2009    left  . Elbow surgery    . Colonoscopy  2007    neg with Dr. [name]    REVIEW OF SYSTEMS:   Constitutional: Denies abnormal weight loss, (+) fatigue Eyes: Denies blurriness of vision Ears, nose, mouth, throat, and face: Denies mucositis or sore throat  Respiratory: Denies cough, dyspnea or wheezes Cardiovascular: Denies palpitation, chest discomfort or lower extremity swelling Gastrointestinal:  Denies nausea, heartburn or change in bowel habits Skin: Denies abnormal skin rashes Lymphatics: Denies new lymphadenopathy or easy bruising Neurological:Denies numbness, tingling or new weaknesses Behavioral/Psych: Mood is stable, no new changes  All other systems were reviewed with the patient and are negative.  PHYSICAL EXAMINATION: ECOG PERFORMANCE STATUS: 0  Blood pressure 104/59, pulse 74, temperature 97.8 F (36.6 C), temperature source Oral, resp. rate 16, weight 129 lb 8 oz (58.741 kg).  GENERAL:alert, no distress and comfortable; easily mobile to exam table; kyphosis, moderate.  SKIN: skin color, texture, turgor are normal, no rashes or significant lesions; Abrasion well healed on RLE.  EYES: normal, Conjunctiva are pink, 1 tear/injectionof blood vessel left eye in sclera. OROPHARYNX:no exudate, no erythema and lips, buccal mucosa, and tongue normal, no ONJ NECK: supple, thyroid normal size, non-tender, without nodularity; nodules cervical bilaterally.  LYMPH:   no palpable lymphadenopathy in the cervical, axillary or supraclavicular LUNGS: crackles at the bases bilaterally with normal breathing effort, no wheezes or rhonchi HEART: regular rate & rhythm and no murmurs and no lower extremity edema ABDOMEN:abdomen soft, non-tender and normal bowel sounds Musculoskeletal:no cyanosis of digits and no clubbing  NEURO: alert & oriented x 3 with fluent speech, no focal motor/sensory deficits  Labs:  CBC Latest Ref Rng 11/21/2014 11/07/2014 10/13/2014  WBC 3.9 - 10.3 10e3/uL 3.0(L) 3.7(L) 4.3  Hemoglobin 11.6 - 15.9 g/dL 10.0(L) 10.8(L) 10.3(L)  Hematocrit 34.8 - 46.6 % 30.9(L) 32.8(L) 31.5(L)  Platelets 145 - 400 10e3/uL 163 173 207    CMP Latest Ref Rng 11/21/2014 11/07/2014 10/13/2014  Glucose 70 - 140 mg/dl 84 101 70  BUN 7.0 - 26.0 mg/dL 14.3 14.9 18.2  Creatinine 0.6 - 1.1 mg/dL 0.8 0.9 0.8  Sodium 136 - 145 mEq/L 141 139 139  Potassium 3.5 - 5.1 mEq/L 3.8 4.1 3.8  Chloride 96 - 112 mEq/L - - -  CO2 22 - 29 mEq/L 24 20(L) 24  Calcium 8.4 - 10.4 mg/dL 8.5 8.9 9.0  Total Protein 6.4 - 8.3 g/dL 6.7 7.2 7.1  Total Bilirubin 0.20 - 1.20 mg/dL  0.63 0.62 0.57  Alkaline Phos 40 - 150 U/L 61 67 65  AST 5 - 34 U/L 25 28 22   ALT 0 - 55 U/L 23 23 21     RADIOGRAPHIC STUDIES: None  ASSESSMENT: ADYN HOES 74 y.o. female with a history of Multiple Myeloma s/p autotransplant in August 2014 now on maintenence low dose Revlimid therapy  PLAN:   1. IgG kappa Multiple myeloma s/p Auto SCT, intermediate risk.  --MM markers demonstrate a very good response (M protein baseline 2.6, down to 0.28 after induction chemo and transplant, stable around 0.5 since July 2015,  Most recent 0.6 on 10/13/2014)  -Her quantitative IgG and kappa light chain level has slightly increased comparing to 2 months ago - Acyclovir has been stopped after 1 year of SCT.   -Revlimid was recently held due to her pneumonia. Giving her 2 episodes of severe pneumonia quite hospitalization and  vent mask, I'll hold on Revlimid for 2 month until she completely recovery. -she is clinically doing very well, repeat a chest x-ray is normal. -Restarted Revlimid on 11/13/2014  2. Recurrent pneumonia -Resolved. Repeated chest x-ray on 11/02/2014 was normal.  2. Left lower premolar tooth infection -She has had the tooth extraction. -Restart Zometa today and continue monthly   3. Pancytopenia due to chemotherapy. --   Her blood counts improved after she is off Revlimid.  3. Osteoporosis -recently repeated bone density scan showed worsening osteoporosis, will continue zometa monthly for additional 6 month then change to every 3 months.    Plan -Continue Revlimid -Zometa infusion monthly  -follow-up with Dr. Burr Medico in as already scheduled in about 2 weeks prior to the next cycle.  All questions were answered. The patient knows to call the clinic with any problems, questions or concerns. We can certainly see the patient much sooner if necessary.  I spent 15 minutes counseling the patient face to face. The total time spent in the appointment was 20 minutes.   Mikey Bussing  11/21/2014

## 2014-11-24 LAB — SPEP & IFE WITH QIG
ALPHA-2-GLOBULIN: 0.6 g/dL (ref 0.5–0.9)
Abnormal Protein Band1: 0.5 g/dL
Albumin ELP: 3.8 g/dL (ref 3.8–4.8)
Alpha-1-Globulin: 0.3 g/dL (ref 0.2–0.3)
Beta 2: 0.2 g/dL (ref 0.2–0.5)
Beta Globulin: 0.4 g/dL (ref 0.4–0.6)
Gamma Globulin: 1.4 g/dL (ref 0.8–1.7)
IGG (IMMUNOGLOBIN G), SERUM: 1460 mg/dL (ref 690–1700)
IGM, SERUM: 29 mg/dL — AB (ref 52–322)
IgA: 105 mg/dL (ref 69–380)
TOTAL PROTEIN, SERUM ELECTROPHOR: 6.7 g/dL (ref 6.1–8.1)

## 2014-11-24 LAB — KAPPA/LAMBDA LIGHT CHAINS
Kappa free light chain: 3.95 mg/dL — ABNORMAL HIGH (ref 0.33–1.94)
Kappa:Lambda Ratio: 1.9 — ABNORMAL HIGH (ref 0.26–1.65)
Lambda Free Lght Chn: 2.08 mg/dL (ref 0.57–2.63)

## 2014-11-29 ENCOUNTER — Other Ambulatory Visit: Payer: Self-pay | Admitting: Hematology

## 2014-11-30 ENCOUNTER — Other Ambulatory Visit: Payer: Self-pay | Admitting: *Deleted

## 2014-11-30 DIAGNOSIS — C9000 Multiple myeloma not having achieved remission: Secondary | ICD-10-CM

## 2014-11-30 MED ORDER — REVLIMID 2.5 MG PO CAPS: 2.5000 mg | ORAL_CAPSULE | Freq: Every day | ORAL | Status: DC

## 2014-12-05 ENCOUNTER — Ambulatory Visit (HOSPITAL_BASED_OUTPATIENT_CLINIC_OR_DEPARTMENT_OTHER): Payer: Medicare Other

## 2014-12-05 ENCOUNTER — Ambulatory Visit (HOSPITAL_BASED_OUTPATIENT_CLINIC_OR_DEPARTMENT_OTHER): Payer: Medicare Other | Admitting: Hematology

## 2014-12-05 ENCOUNTER — Encounter: Payer: Self-pay | Admitting: Hematology

## 2014-12-05 ENCOUNTER — Other Ambulatory Visit (HOSPITAL_BASED_OUTPATIENT_CLINIC_OR_DEPARTMENT_OTHER): Payer: Medicare Other

## 2014-12-05 ENCOUNTER — Other Ambulatory Visit: Payer: Self-pay | Admitting: *Deleted

## 2014-12-05 VITALS — BP 122/69 | HR 74 | Temp 97.8°F | Resp 18 | Ht 67.0 in | Wt 132.0 lb

## 2014-12-05 DIAGNOSIS — C9000 Multiple myeloma not having achieved remission: Secondary | ICD-10-CM | POA: Diagnosis not present

## 2014-12-05 DIAGNOSIS — D61810 Antineoplastic chemotherapy induced pancytopenia: Secondary | ICD-10-CM

## 2014-12-05 LAB — COMPREHENSIVE METABOLIC PANEL (CC13)
ALK PHOS: 59 U/L (ref 40–150)
ALT: 25 U/L (ref 0–55)
AST: 29 U/L (ref 5–34)
Albumin: 3.7 g/dL (ref 3.5–5.0)
Anion Gap: 13 mEq/L — ABNORMAL HIGH (ref 3–11)
BILIRUBIN TOTAL: 0.61 mg/dL (ref 0.20–1.20)
BUN: 17 mg/dL (ref 7.0–26.0)
CHLORIDE: 107 meq/L (ref 98–109)
CO2: 21 mEq/L — ABNORMAL LOW (ref 22–29)
CREATININE: 0.8 mg/dL (ref 0.6–1.1)
Calcium: 8.7 mg/dL (ref 8.4–10.4)
EGFR: 73 mL/min/{1.73_m2} — ABNORMAL LOW (ref >90)
Glucose: 87 mg/dl (ref 70–140)
POTASSIUM: 3.7 meq/L (ref 3.5–5.1)
SODIUM: 141 meq/L (ref 136–145)
TOTAL PROTEIN: 6.9 g/dL (ref 6.4–8.3)

## 2014-12-05 LAB — CBC WITH DIFFERENTIAL/PLATELET
BASO%: 2.3 % — AB (ref 0.0–2.0)
BASOS ABS: 0.1 10*3/uL (ref 0.0–0.1)
EOS%: 4 % (ref 0.0–7.0)
Eosinophils Absolute: 0.1 10*3/uL (ref 0.0–0.5)
HEMATOCRIT: 32.5 % — AB (ref 34.8–46.6)
HGB: 10.9 g/dL — ABNORMAL LOW (ref 11.6–15.9)
LYMPH%: 23.6 % (ref 14.0–49.7)
MCH: 35.1 pg — ABNORMAL HIGH (ref 25.1–34.0)
MCHC: 33.4 g/dL (ref 31.5–36.0)
MCV: 105.3 fL — AB (ref 79.5–101.0)
MONO#: 0.3 10*3/uL (ref 0.1–0.9)
MONO%: 14.2 % — ABNORMAL HIGH (ref 0.0–14.0)
NEUT#: 1.2 10*3/uL — ABNORMAL LOW (ref 1.5–6.5)
NEUT%: 55.9 % (ref 38.4–76.8)
Platelets: 178 10*3/uL (ref 145–400)
RBC: 3.09 10*6/uL — AB (ref 3.70–5.45)
RDW: 15.5 % — AB (ref 11.2–14.5)
WBC: 2.2 10*3/uL — ABNORMAL LOW (ref 3.9–10.3)
lymph#: 0.5 10*3/uL — ABNORMAL LOW (ref 0.9–3.3)

## 2014-12-05 MED ORDER — REVLIMID 2.5 MG PO CAPS: 2.5000 mg | ORAL_CAPSULE | Freq: Every day | ORAL | Status: DC

## 2014-12-05 MED ORDER — ZOLEDRONIC ACID 4 MG/5ML IV CONC
3.5000 mg | Freq: Once | INTRAVENOUS | Status: AC
  Administered 2014-12-05: 3.5 mg via INTRAVENOUS
  Filled 2014-12-05: qty 4.38

## 2014-12-05 MED ORDER — SODIUM CHLORIDE 0.9 % IV SOLN
Freq: Once | INTRAVENOUS | Status: AC
  Administered 2014-12-05: 11:00:00 via INTRAVENOUS

## 2014-12-05 NOTE — Progress Notes (Addendum)
Shannon Rush Shannon Rush [id number]  DIAGNOSIS: No diagnosis found.  CHIEF COMPLAINS: Follow-up of multiple myeloma  CURRENT TREATMENT:  Zometa 3.5 mg monthly started on 06/2013;  Revlimid 5 mg daily for 21 days then off for 7 days for maintenance for at least 2 years started on 09/02/2013.  Decreased to Revlimid 2.5 mg daily for 21 days then off for 7 days on 12/30/2013. Chemo was held multiple times due to hospitalization and infection.     Multiple myeloma   05/26/2012 Initial Diagnosis Presenting IgG was 4,040 mg/dL on 06/05/2012 (IgA 35; IgM 34); kappa free light chain 34.4 mg/dL, lambda 0.00, kappa:lambda ratio of 34.75; SPEP with M-spike of 2.60.   06/30/2012 Imaging Numerous lytic lesions throughout the calvarium.  Lytic lesion within the left lateral scapula.  Findings most compatible with metastases or myeloma. Multiple mid and lower thoracic compression fractures.  Slight compression through the endplates at L3.   02/25/4817 Bone Marrow Biopsy Bone marrow biopsy showed 49% plasma cell. Normal classical cytogenetics; however, myeloma FISH panel showed 13q- (intermediate risk)       07/14/2012 - 07/27/2012 Radiation Therapy Palliative radiation 20 Gy over 10 fractions between to thoracic spine cord compression and symptomatic left scapula lytic lesion.   08/10/2012 -  Chemotherapy Started SQ Velcade once weekly, 3 weeks on, 1 weeks off; daily Revlimid d1-21, 7 days off; and Dexamethasone 28m PO weekly.    02/12/2013 [facility]    06/14/2013 Tumor Marker IgG  1830,  Kappa:lamba ratio, 1.69 (baseline was 34.75 or   M-spike 0.28 (baseline of 2.6 or  89.3% of baseline).     06/25/2013 -  Chemotherapy Started zometa 3.5 mg monthly    09/01/2013 -  Chemotherapy Maintenance therapy with revlimid 559mdaily for 14 days and then off for 7 (decreased from 21  days on and 7 days off based on neutropenia).    09/13/2013 - 09/17/2014 Hospital Admission Hospital admission for pneumonia   12/20/2013 Treatment Plan Change Maintenance therapy decreased to 2.5 mg daily for 21 days and then off for 7 days based on low counts.    01/25/2014 Tumor Marker IgG 1360 M spike 0.5   03/01/2014 - 03/05/2014 [facility], received thrombolytic therapy, cath showed 60% prox RCA, mid-distal RCA 50%, Echo normal, no stents.   04/11/2014 - 08/07/2014 Chemotherapy Continue Revlimid at 2.5 mg 3 weeks on 1 week off   08/08/2014 - 08/13/2014 Hospital Admission Hospital admission for pneumonia. Her Revlimid was held.   08/29/2014 - 09/14/2014 Chemotherapy Maintenance Revlimid restarted   09/14/2014 - 09/17/2014 Hospital Admission [place].   11/13/2014 -  Chemotherapy Maintenance Revlimid restarted   INTERVAL HISTORY:  Shannon BUSIC74.o. female with a past medical history of MM as detailed above, s/p SCT is here for follow-up.   She restarted her Revlimid on 11/13/2014. She finished this cycle 2 days ago. She tolerated well, although does report slightly worse fatigue. No fever or chills, no cough, dyspnea, or pain. She remains to be active, exercise and bike regularly. Appetite and energy level are good.  MEDICAL HISTORY: Past Medical History  Diagnosis Date  . Multiple myeloma 07/01/2012  . Unspecified deficiency anemia   . OSTEOPOROSIS 06/11/2010  Multiple compression fractures; and spontaneous fracture of sternum Qualifier: Diagnosis of  By: [name], [name]     . 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 Cord compression 07/13/12    MRI- diffuse myeloma involvement of T-L spine  . History of radiation therapy 07/13/12-07/27/12    spinal cord compression T3-T10,left scapula  . CAD (coronary artery disease)     a. inf STEMI (in  [place] >>> [facility]) >>> LHC (8/15- [place]):  pRCA 60%, mid to dist RCA 50% >>> med rx  . Hx of echocardiogram     Echo (03/02/14-done at [facility]):  Normal LVEF without significant regional wall motion abnormalities. Mild      ALLERGIES:  is allergic to zosyn; zithromax; and ciprofloxacin.  MEDICATIONS: has a current medication list which includes the following prescription(s): acetaminophen, atorvastatin, calcium carbonate, clopidogrel, metoprolol succinate, morphine, revlimid, vitamin d (cholecalciferol), and proair hfa.  SURGICAL HISTORY:  Past Surgical History  Procedure Laterality Date  . Appendectomy    . Tubal ligation    . Cesarean section      x2   . Hip surgery  2009    left  . Elbow surgery    . Colonoscopy  2007    neg with Dr. [name]    REVIEW OF SYSTEMS:   Constitutional: Denies abnormal weight loss, (+) fatigue Eyes: Denies blurriness of vision Ears, nose, mouth, throat, and face: Denies mucositis or sore throat, (+) left tooth pain  Respiratory: Denies cough, dyspnea or wheezes Cardiovascular: Denies palpitation, chest discomfort or lower extremity swelling Gastrointestinal:  Denies nausea, heartburn or change in bowel habits Skin: Denies abnormal skin rashes Lymphatics: Denies new lymphadenopathy or easy bruising Neurological:Denies numbness, tingling or new weaknesses Behavioral/Psych: Mood is stable, no new changes  All other systems were reviewed with the patient and are negative.  PHYSICAL EXAMINATION: ECOG PERFORMANCE STATUS: 0  Blood pressure 122/69, pulse 74, temperature 97.8 F (36.6 C), temperature source Oral, resp. rate 18, height _0  (1.702 m), weight 132 lb (59.875 kg), SpO2 100 %.  GENERAL:alert, no distress and comfortable; easily mobile to exam table; kyphosis, moderate.  SKIN: skin color, texture, turgor are normal, no rashes or significant lesions; Abrasion well healed on RLE.  EYES:  normal, Conjunctiva are pink, 1 tear/injectionof blood vessel left eye in sclera. OROPHARYNX:no exudate, no erythema and lips, buccal mucosa, and tongue normal, no ONJ NECK: supple, thyroid normal size, non-tender, without nodularity; nodules cervical bilaterally.  LYMPH:  no palpable lymphadenopathy in the cervical, axillary or supraclavicular LUNGS: crackles at the bases bilaterally with normal breathing effort, no wheezes or rhonchi HEART: regular rate & rhythm and no murmurs and no lower extremity edema ABDOMEN:abdomen soft, non-tender and normal bowel sounds Musculoskeletal:no cyanosis of digits and no clubbing  NEURO: alert & oriented x 3 with fluent speech, no focal motor/sensory deficits  Labs:  CBC Latest Ref Rng 12/05/2014 11/21/2014 11/07/2014  WBC 3.9 - 10.3 10e3/uL 2.2(L) 3.0(L) 3.7(L)  Hemoglobin 11.6 - 15.9 g/dL 10.9(L) 10.0(L) 10.8(L)  Hematocrit 34.8 - 46.6 % 32.5(L) 30.9(L) 32.8(L)  Platelets 145 - 400 10e3/uL 178 163 173    CMP Latest Ref Rng 11/21/2014 11/07/2014 10/13/2014  Glucose 70 - 140 mg/dl 84 101 70  BUN 7.0 - 26.0 mg/dL 14.3 14.9 18.2  Creatinine 0.6 - 1.1 mg/dL 0.8 0.9 0.8  Sodium 136 - 145 mEq/L 141 139 139  Potassium 3.5 - 5.1 mEq/L 3.8 4.1 3.8  Chloride 96 - 112 mEq/L - - -  CO2 22 - 29 mEq/L 24 20(L) 24  Calcium 8.4 - 10.4 mg/dL 8.5 8.9 9.0  Total Protein 6.4 - 8.3 g/dL 6.7 7.2 7.1  Total Bilirubin 0.20 - 1.20 mg/dL 0.63 0.62 0.57  Alkaline Phos 40 - 150 U/L 61 67 65  AST 5 - 34 U/L _0 ALT 0 - 55 U/L _1 SPEP M-PROTEIN 11/21/2014: 0.5  Serum kappa, lambda light chains, and ratio  11/21/2014: 3.95, 2.08, 1.90   24 hr urine PEP 10/13/2014: (-)  RADIOGRAPHIC STUDIES: None  ASSESSMENT: Shannon Rush 74 y.o. female with a history of Multiple Myeloma s/p autotransplant in August 2014 now on maintenence low dose Revlimid therapy  PLAN:   1. IgG kappa Multiple myeloma s/p Auto SCT, intermediate risk.  --MM markers demonstrate a very good  response (M protein baseline 2.6, down to 0.28 after induction chemo and transplant, stable around 0.5 since July 2015,  Most recent 0.6 on 10/13/2014)  -Her quantitative IgG and kappa light chain level has slightly increased comparing to 2 months ago - Acyclovir has been stopped after 1 year of SCT.   -Revlimid was recently held due to her pneumonia, and restarted it again.  -Her [name] today is 1.2, neutropenia precautions reviewed. -I'll check her CBC again next Monday before she starts the next cycle. If recovers well, we'll continue.  2. Recurrent pneumonia -Resolved. Repeated chest x-ray on 11/02/2014 was normal.  2. Left lower premolar tooth infection -She has had the tooth extraction. -Continue Zometa.   3. Pancytopenia due to chemotherapy. -Continue monitoring  3. Osteoporosis -recently repeated bone density scan showed worsening osteoporosis, will continue zometa monthly for additional 6 month then change to every 3 months.    Plan -Zometa infusion today, And continue monthly  -Repeat a CBC next Monday. If [place] recovers about 1.5, we'll restart next cycle of Revlimid. -Return to clinic in 5 weeks. She has extra 5 tablet of Revlimid at home, we'll refill 16 tablets for next cycle.  All questions were answered. The patient knows to call the clinic with any problems, questions or concerns. We can certainly see the patient much sooner if necessary.  I spent 20 minutes counseling the patient face to face. The total time spent in the appointment was 25 minutes.   [name]  12/05/2014   Addendum I repeated CBC showed ANC 1.4 today. I called patient and instructed her to start next cycle Revlimid tomorrow, but change from 3 weeks on to 2 weeks on, followed by 1 week off. He needs a repeat CBC in 2 weeks, and I'll see her back in 3 weeks before next cycle.  [name]

## 2014-12-13 ENCOUNTER — Other Ambulatory Visit: Payer: Self-pay | Admitting: *Deleted

## 2014-12-13 ENCOUNTER — Telehealth: Payer: Self-pay | Admitting: *Deleted

## 2014-12-13 ENCOUNTER — Other Ambulatory Visit (HOSPITAL_BASED_OUTPATIENT_CLINIC_OR_DEPARTMENT_OTHER): Payer: Medicare Other

## 2014-12-13 DIAGNOSIS — C9000 Multiple myeloma not having achieved remission: Secondary | ICD-10-CM

## 2014-12-13 LAB — CBC WITH DIFFERENTIAL/PLATELET
BASO%: 3.5 % — AB (ref 0.0–2.0)
Basophils Absolute: 0.1 10*3/uL (ref 0.0–0.1)
EOS%: 2.7 % (ref 0.0–7.0)
Eosinophils Absolute: 0.1 10*3/uL (ref 0.0–0.5)
HCT: 32.8 % — ABNORMAL LOW (ref 34.8–46.6)
HGB: 11 g/dL — ABNORMAL LOW (ref 11.6–15.9)
LYMPH#: 0.7 10*3/uL — AB (ref 0.9–3.3)
LYMPH%: 25.2 % (ref 14.0–49.7)
MCH: 35.4 pg — ABNORMAL HIGH (ref 25.1–34.0)
MCHC: 33.7 g/dL (ref 31.5–36.0)
MCV: 105 fL — ABNORMAL HIGH (ref 79.5–101.0)
MONO#: 0.5 10*3/uL (ref 0.1–0.9)
MONO%: 16.8 % — ABNORMAL HIGH (ref 0.0–14.0)
NEUT#: 1.4 10*3/uL — ABNORMAL LOW (ref 1.5–6.5)
NEUT%: 51.8 % (ref 38.4–76.8)
PLATELETS: 233 10*3/uL (ref 145–400)
RBC: 3.12 10*6/uL — AB (ref 3.70–5.45)
RDW: 15.1 % — ABNORMAL HIGH (ref 11.2–14.5)
WBC: 2.8 10*3/uL — AB (ref 3.9–10.3)

## 2014-12-13 LAB — COMPREHENSIVE METABOLIC PANEL (CC13)
ALK PHOS: 59 U/L (ref 40–150)
ALT: 19 U/L (ref 0–55)
AST: 27 U/L (ref 5–34)
Albumin: 3.9 g/dL (ref 3.5–5.0)
Anion Gap: 7 mEq/L (ref 3–11)
BUN: 16.8 mg/dL (ref 7.0–26.0)
CALCIUM: 8.5 mg/dL (ref 8.4–10.4)
CO2: 24 mEq/L (ref 22–29)
CREATININE: 0.8 mg/dL (ref 0.6–1.1)
Chloride: 107 mEq/L (ref 98–109)
EGFR: 77 mL/min/{1.73_m2} — ABNORMAL LOW (ref >90)
Glucose: 89 mg/dl (ref 70–140)
Potassium: 4.1 mEq/L (ref 3.5–5.1)
Sodium: 138 mEq/L (ref 136–145)
Total Bilirubin: 0.84 mg/dL (ref 0.20–1.20)
Total Protein: 7.3 g/dL (ref 6.4–8.3)

## 2014-12-13 NOTE — Telephone Encounter (Signed)
Labs added wkly per standing order, schedule made to give to pt at lab visit today... KJ

## 2014-12-13 NOTE — Telephone Encounter (Addendum)
PT. WANTED A LAB APPOINTMENT TODAY AT 2:30PM SO DR.FENG COULD SEE ABOUT HER REVLIMID MEDICATION. STANDING LABS,CBC WITH DIFF AND A CMET, WERE IN THE SYSTEM. AN APPOINTMENT AT 2:30PM TODAY HAS BEEN SCHEDULED AND PT. IS AWARE.

## 2014-12-14 ENCOUNTER — Telehealth: Payer: Self-pay | Admitting: *Deleted

## 2014-12-14 NOTE — Telephone Encounter (Signed)
I called pt back, instructed her to start revlimid tomorrow, for 2 weeks (instead of 3 weeks) then have one week off. She will need lab in 2 weeks and I will see her back in 3 weeks.   Shannon Rush  12/14/2014

## 2014-12-14 NOTE — Telephone Encounter (Signed)
Patient called .  She had lab work yesterday and she is waiting for results and directions on whether or not she should restart her Revlimid.  Please call her @ [phone].

## 2014-12-15 ENCOUNTER — Other Ambulatory Visit: Payer: Self-pay | Admitting: *Deleted

## 2014-12-15 ENCOUNTER — Telehealth: Payer: Self-pay | Admitting: Hematology

## 2014-12-15 ENCOUNTER — Telehealth: Payer: Self-pay | Admitting: *Deleted

## 2014-12-15 NOTE — Telephone Encounter (Signed)
per pof to sch pt appt-cld & left pt a message and adv pt of next appt-adv to get updated sch on 6/16

## 2014-12-15 NOTE — Telephone Encounter (Signed)
Per staff message and POF I have scheduled appts. Advised scheduler of appts. JMW  

## 2014-12-19 ENCOUNTER — Other Ambulatory Visit: Payer: Self-pay

## 2014-12-26 ENCOUNTER — Other Ambulatory Visit: Payer: Self-pay

## 2014-12-28 ENCOUNTER — Telehealth: Payer: Self-pay | Admitting: *Deleted

## 2014-12-28 NOTE — Telephone Encounter (Signed)
[facility] faxed Revlimid refill request.  Request to provider's desk/in-basket for review.

## 2014-12-29 ENCOUNTER — Other Ambulatory Visit: Payer: Self-pay

## 2014-12-29 ENCOUNTER — Telehealth: Payer: Self-pay | Admitting: *Deleted

## 2014-12-29 ENCOUNTER — Other Ambulatory Visit (HOSPITAL_BASED_OUTPATIENT_CLINIC_OR_DEPARTMENT_OTHER): Payer: Medicare Other

## 2014-12-29 ENCOUNTER — Other Ambulatory Visit: Payer: Self-pay | Admitting: *Deleted

## 2014-12-29 DIAGNOSIS — C9000 Multiple myeloma not having achieved remission: Secondary | ICD-10-CM | POA: Diagnosis not present

## 2014-12-29 LAB — CBC WITH DIFFERENTIAL/PLATELET
BASO%: 1.4 % (ref 0.0–2.0)
Basophils Absolute: 0.1 10*3/uL (ref 0.0–0.1)
EOS%: 4.7 % (ref 0.0–7.0)
Eosinophils Absolute: 0.2 10*3/uL (ref 0.0–0.5)
HCT: 35 % (ref 34.8–46.6)
HEMOGLOBIN: 11.7 g/dL (ref 11.6–15.9)
LYMPH%: 23.1 % (ref 14.0–49.7)
MCH: 35.2 pg — AB (ref 25.1–34.0)
MCHC: 33.4 g/dL (ref 31.5–36.0)
MCV: 105.4 fL — ABNORMAL HIGH (ref 79.5–101.0)
MONO#: 0.6 10*3/uL (ref 0.1–0.9)
MONO%: 15.8 % — ABNORMAL HIGH (ref 0.0–14.0)
NEUT#: 2 10*3/uL (ref 1.5–6.5)
NEUT%: 55 % (ref 38.4–76.8)
Platelets: 165 10*3/uL (ref 145–400)
RBC: 3.32 10*6/uL — ABNORMAL LOW (ref 3.70–5.45)
RDW: 14.6 % — AB (ref 11.2–14.5)
WBC: 3.6 10*3/uL — ABNORMAL LOW (ref 3.9–10.3)
lymph#: 0.8 10*3/uL — ABNORMAL LOW (ref 0.9–3.3)

## 2014-12-29 LAB — COMPREHENSIVE METABOLIC PANEL (CC13)
ALK PHOS: 60 U/L (ref 40–150)
ALT: 28 U/L (ref 0–55)
AST: 32 U/L (ref 5–34)
Albumin: 4.1 g/dL (ref 3.5–5.0)
Anion Gap: 7 mEq/L (ref 3–11)
BUN: 15.6 mg/dL (ref 7.0–26.0)
CO2: 26 mEq/L (ref 22–29)
Calcium: 9.3 mg/dL (ref 8.4–10.4)
Chloride: 107 mEq/L (ref 98–109)
Creatinine: 0.8 mg/dL (ref 0.6–1.1)
EGFR: 70 mL/min/{1.73_m2} — ABNORMAL LOW (ref >90)
Glucose: 88 mg/dl (ref 70–140)
POTASSIUM: 3.9 meq/L (ref 3.5–5.1)
SODIUM: 140 meq/L (ref 136–145)
TOTAL PROTEIN: 7.7 g/dL (ref 6.4–8.3)
Total Bilirubin: 0.99 mg/dL (ref 0.20–1.20)

## 2014-12-29 MED ORDER — REVLIMID 2.5 MG PO CAPS: 2.5000 mg | ORAL_CAPSULE | Freq: Every day | ORAL | Status: DC

## 2014-12-29 NOTE — Telephone Encounter (Signed)
Spoke with pt and informed pt re:  Dr. Burr Medico reviewed all lab results today.  Pt is on 1 week off starting today 12/29/14. Informed pt that a refill for Revlimid was sent to [facility].   Pt aware of office visit appt on 01/04/15.   Instructed pt to start Revlimid on same day - 2.5mg  daily for 14 days,  Rest 7 days - as per Dr. Ernestina Penna instructions.    Pt voiced understanding.

## 2015-01-02 ENCOUNTER — Ambulatory Visit: Payer: Self-pay

## 2015-01-02 ENCOUNTER — Other Ambulatory Visit: Payer: Self-pay

## 2015-01-02 DIAGNOSIS — M6281 Muscle weakness (generalized): Secondary | ICD-10-CM | POA: Diagnosis not present

## 2015-01-04 ENCOUNTER — Ambulatory Visit (HOSPITAL_BASED_OUTPATIENT_CLINIC_OR_DEPARTMENT_OTHER): Payer: Medicare Other

## 2015-01-04 ENCOUNTER — Telehealth: Payer: Self-pay | Admitting: Hematology

## 2015-01-04 ENCOUNTER — Ambulatory Visit (HOSPITAL_BASED_OUTPATIENT_CLINIC_OR_DEPARTMENT_OTHER): Payer: Medicare Other | Admitting: Hematology

## 2015-01-04 ENCOUNTER — Other Ambulatory Visit (HOSPITAL_BASED_OUTPATIENT_CLINIC_OR_DEPARTMENT_OTHER): Payer: Medicare Other

## 2015-01-04 ENCOUNTER — Encounter: Payer: Self-pay | Admitting: Hematology

## 2015-01-04 VITALS — BP 126/65 | HR 79 | Temp 98.6°F | Resp 18 | Ht 67.0 in | Wt 129.3 lb

## 2015-01-04 DIAGNOSIS — C9000 Multiple myeloma not having achieved remission: Secondary | ICD-10-CM | POA: Diagnosis not present

## 2015-01-04 DIAGNOSIS — M810 Age-related osteoporosis without current pathological fracture: Secondary | ICD-10-CM

## 2015-01-04 DIAGNOSIS — D61811 Other drug-induced pancytopenia: Secondary | ICD-10-CM

## 2015-01-04 LAB — COMPREHENSIVE METABOLIC PANEL (CC13)
ALK PHOS: 60 U/L (ref 40–150)
ALT: 42 U/L (ref 0–55)
ANION GAP: 7 meq/L (ref 3–11)
AST: 42 U/L — AB (ref 5–34)
Albumin: 3.7 g/dL (ref 3.5–5.0)
BUN: 17.2 mg/dL (ref 7.0–26.0)
CO2: 23 mEq/L (ref 22–29)
CREATININE: 0.8 mg/dL (ref 0.6–1.1)
Calcium: 8.9 mg/dL (ref 8.4–10.4)
Chloride: 110 mEq/L — ABNORMAL HIGH (ref 98–109)
EGFR: 76 mL/min/{1.73_m2} — ABNORMAL LOW (ref >90)
Glucose: 107 mg/dl (ref 70–140)
Potassium: 4 mEq/L (ref 3.5–5.1)
Sodium: 139 mEq/L (ref 136–145)
Total Bilirubin: 0.64 mg/dL (ref 0.20–1.20)
Total Protein: 6.9 g/dL (ref 6.4–8.3)

## 2015-01-04 LAB — CBC WITH DIFFERENTIAL/PLATELET
BASO%: 1.9 % (ref 0.0–2.0)
BASOS ABS: 0.1 10*3/uL (ref 0.0–0.1)
EOS%: 5.3 % (ref 0.0–7.0)
Eosinophils Absolute: 0.2 10*3/uL (ref 0.0–0.5)
HCT: 33.7 % — ABNORMAL LOW (ref 34.8–46.6)
HGB: 11.4 g/dL — ABNORMAL LOW (ref 11.6–15.9)
LYMPH%: 25.2 % (ref 14.0–49.7)
MCH: 34.9 pg — AB (ref 25.1–34.0)
MCHC: 33.7 g/dL (ref 31.5–36.0)
MCV: 103.6 fL — AB (ref 79.5–101.0)
MONO#: 0.4 10*3/uL (ref 0.1–0.9)
MONO%: 12.9 % (ref 0.0–14.0)
NEUT#: 1.6 10*3/uL (ref 1.5–6.5)
NEUT%: 54.7 % (ref 38.4–76.8)
Platelets: 165 10*3/uL (ref 145–400)
RBC: 3.25 10*6/uL — ABNORMAL LOW (ref 3.70–5.45)
RDW: 14.3 % (ref 11.2–14.5)
WBC: 3 10*3/uL — ABNORMAL LOW (ref 3.9–10.3)
lymph#: 0.8 10*3/uL — ABNORMAL LOW (ref 0.9–3.3)

## 2015-01-04 MED ORDER — ZOLEDRONIC ACID 4 MG/5ML IV CONC
3.5000 mg | Freq: Once | INTRAVENOUS | Status: AC
  Administered 2015-01-04: 3.5 mg via INTRAVENOUS
  Filled 2015-01-04: qty 4.38

## 2015-01-04 MED ORDER — SODIUM CHLORIDE 0.9 % IV SOLN
Freq: Once | INTRAVENOUS | Status: AC
  Administered 2015-01-04: 09:00:00 via INTRAVENOUS

## 2015-01-04 NOTE — Telephone Encounter (Signed)
per pof to add lab to 7/18 appt-cld & spoke w/pt to adv of appt time & date-pt understood

## 2015-01-04 NOTE — Patient Instructions (Signed)

## 2015-01-04 NOTE — Telephone Encounter (Signed)
added lab per pof per Burr Medico

## 2015-01-04 NOTE — Progress Notes (Signed)
[place], [place] [address]  DIAGNOSIS: Multiple myeloma  CHIEF COMPLAINS: Follow-up of multiple myeloma  CURRENT TREATMENT:  Zometa 3.5 mg monthly started on 06/2013;  Revlimid 5 mg daily for 21 days then off for 7 days for maintenance for at least 2 years started on 09/02/2013.  Decreased to Revlimid 2.5 mg daily for 21 days then off for 7 days on 12/30/2013. Chemo was held multiple times due to hospitalization and infection.     Multiple myeloma   05/26/2012 Initial Diagnosis Presenting IgG was 4,040 mg/dL on 06/05/2012 (IgA 35; IgM 34); kappa free light chain 34.4 mg/dL, lambda 0.00, kappa:lambda ratio of 34.75; SPEP with M-spike of 2.60.   06/30/2012 Imaging Numerous lytic lesions throughout the calvarium.  Lytic lesion within the left lateral scapula.  Findings most compatible with metastases or myeloma. Multiple mid and lower thoracic compression fractures.  Slight compression through the endplates at L3.   21/97/5883 Bone Marrow Biopsy Bone marrow biopsy showed 49% plasma cell. Normal classical cytogenetics; however, myeloma FISH panel showed 13q- (intermediate risk)       07/14/2012 - 07/27/2012 Radiation Therapy Palliative radiation 20 Gy over 10 fractions between to thoracic spine cord compression and symptomatic left scapula lytic lesion.   08/10/2012 -  Chemotherapy Started SQ Velcade once weekly, 3 weeks on, 1 weeks off; daily Revlimid d1-21, 7 days off; and Dexamethasone 27m PO weekly.    02[facility]12[facility]2013 [facility]    06/14/2013 Tumor Marker IgG  1830,  Kappa:lamba ratio, 1.69 (baseline was 34.75 or   M-spike 0.28 (baseline of 2.6 or  89.3% of baseline).     06/25/2013 -  Chemotherapy Started zometa 3.5 mg monthly    09/01/2013 -  Chemotherapy Maintenance therapy with revlimid 532mdaily for 14 days and then off for 7 (decreased from 21 days  on and 7 days off based on neutropenia).    09/13/2013 - 09/17/2014 Hospital Admission Hospital admission for pneumonia   12/20/2013 Treatment Plan Change Maintenance therapy decreased to 2.5 mg daily for 21 days and then off for 7 days based on low counts.    01/25/2014 Tumor Marker IgG 1360 M spike 0.5   03/01/2014 - 03/05/2014 [facility], received thrombolytic therapy, cath showed 60% prox RCA, mid-distal RCA 50%, Echo normal, no stents.   04/11/2014 - 08/07/2014 Chemotherapy Continue Revlimid at 2.5 mg 3 weeks on 1 week off   08/08/2014 - 08/13/2014 Hospital Admission Hospital admission for pneumonia. Her Revlimid was held.   08/29/2014 - 09/14/2014 Chemotherapy Maintenance Revlimid restarted   09/14/2014 - 09/17/2014 Hospital Admission [place].   11/13/2014 -  Chemotherapy Maintenance Revlimid restarted, changed to 2.60m22maily, 2 weks on, 1 week off from 12/15/2014    INTERVAL HISTORY:  Shannon Rush 23o. female with a past medical history of MM as detailed above, s/p SCT is here for follow-up.   She finished last cycle Revlimid one week ago, and will restart next cycle tomorrow. She tolerated well. She denies any fever or chills, mild sore throat and dry cough, no other complaints. [place]  MEDICAL HISTORY: Past Medical History  Diagnosis Date  . Multiple myeloma 07/01/2012  . Unspecified deficiency  anemia   . OSTEOPOROSIS 06/11/2010    Multiple compression fractures; and spontaneous fracture of sternum Qualifier: Diagnosis of  By: [name], [name]     . 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 Cord compression 07/13/12    MRI- diffuse myeloma involvement of T-L spine  . History of radiation therapy 07/13/12-07/27/12    spinal cord compression T3-T10,left scapula  . CAD (coronary artery  disease)     a. inf STEMI (in [place] >>> [facility]) >>> LHC (8/15- [place]):  pRCA 60%, mid to dist RCA 50% >>> med rx  . Hx of echocardiogram     Echo (03/02/14-done at [facility]):  Normal LVEF without significant regional wall motion abnormalities. Mild      ALLERGIES:  is allergic to zosyn; zithromax; and ciprofloxacin.  MEDICATIONS: has a current medication list which includes the following prescription(s): acetaminophen, atorvastatin, calcium carbonate, clopidogrel, metoprolol succinate, morphine, proair hfa, revlimid, and vitamin d (cholecalciferol).  SURGICAL HISTORY:  Past Surgical History  Procedure Laterality Date  . Appendectomy    . Tubal ligation    . Cesarean section      x2   . Hip surgery  2009    left  . Elbow surgery    . Colonoscopy  2007    neg with Dr. [name]    REVIEW OF SYSTEMS:   Constitutional: Denies abnormal weight loss, (+) fatigue Eyes: Denies blurriness of vision Ears, nose, mouth, throat, and face: Denies mucositis or sore throat, (+) left tooth pain  Respiratory: Denies cough, dyspnea or wheezes Cardiovascular: Denies palpitation, chest discomfort or lower extremity swelling Gastrointestinal:  Denies nausea, heartburn or change in bowel habits Skin: Denies abnormal skin rashes Lymphatics: Denies new lymphadenopathy or easy bruising Neurological:Denies numbness, tingling or new weaknesses Behavioral/Psych: Mood is stable, no new changes  All other systems were reviewed with the patient and are negative.  PHYSICAL EXAMINATION: ECOG PERFORMANCE STATUS: 0  Blood pressure 126/65, pulse 79, temperature 98.6 F (37 C), temperature source Oral, resp. rate 18, height 5' 7"  (1.702 m), weight 129 lb 4.8 oz (58.65 kg), SpO2 97 %.  GENERAL:alert, no distress and comfortable; easily mobile to exam table; kyphosis, moderate.  SKIN: skin color, texture, turgor are normal, no rashes or significant lesions;  Abrasion well healed on RLE.  EYES: normal, Conjunctiva are pink, 1 tear/injectionof blood vessel left eye in sclera. OROPHARYNX:no exudate, no erythema and lips, buccal mucosa, and tongue normal, no ONJ NECK: supple, thyroid normal size, non-tender, without nodularity; nodules cervical bilaterally.  LYMPH:  no palpable lymphadenopathy in the cervical, axillary or supraclavicular LUNGS: crackles at the bases bilaterally with normal breathing effort, no wheezes or rhonchi HEART: regular rate & rhythm and no murmurs and no lower extremity edema ABDOMEN:abdomen soft, non-tender and normal bowel sounds Musculoskeletal:no cyanosis of digits and no clubbing  NEURO: alert & oriented x 3 with fluent speech, no focal motor/sensory deficits  Labs:  CBC Latest Ref Rng 01/04/2015 12/29/2014 12/13/2014  WBC 3.9 - 10.3 10e3/uL 3.0(L) 3.6(L) 2.8(L)  Hemoglobin 11.6 - 15.9 g/dL 11.4(L) 11.7 11.0(L)  Hematocrit 34.8 - 46.6 % 33.7(L) 35.0 32.8(L)  Platelets 145 - 400 10e3/uL 165 165 233    CMP Latest Ref Rng 01/04/2015 12/29/2014 12/13/2014  Glucose 70 - 140 mg/dl 107 88 89  BUN 7.0 - 26.0 mg/dL 17.2 15.6 16.8  Creatinine 0.6 - 1.1 mg/dL 0.8 0.8 0.8  Sodium 136 - 145 mEq/L 139 140  138  Potassium 3.5 - 5.1 mEq/L 4.0 3.9 4.1  Chloride 96 - 112 mEq/L - - -  CO2 22 - 29 mEq/L 23 26 24   Calcium 8.4 - 10.4 mg/dL 8.9 9.3 8.5  Total Protein 6.4 - 8.3 g/dL 6.9 7.7 7.3  Total Bilirubin 0.20 - 1.20 mg/dL 0.64 0.99 0.84  Alkaline Phos 40 - 150 U/L 60 60 59  AST 5 - 34 U/L 42(H) 32 27  ALT 0 - 55 U/L 42 28 19   SPEP M-PROTEIN 11/21/2014: 0.5  Serum kappa, lambda light chains, and ratio  11/21/2014: 3.95, 2.08, 1.90   24 hr urine PEP 10/13/2014: (-)  RADIOGRAPHIC STUDIES: None  ASSESSMENT: WALDINE ZENZ 74 y.o. female with a history of Multiple Myeloma s/p autotransplant in August 2014 now on maintenence low dose Revlimid therapy  PLAN:   1. IgG kappa Multiple myeloma s/p Auto SCT, intermediate risk.  --MM  markers demonstrate a very good response (M protein baseline 2.6, down to 0.28 after induction chemo and transplant, stable around 0.5 since July 2015,  Most recent 0.6 on 10/13/2014)  -Her quantitative IgG and kappa light chain level has slightly increased comparing to 2 months ago - Acyclovir has been stopped after 1 year of SCT.   -Revlimid was recently held due to her pneumonia, and restarted it again, and the reduced to 2.5 mg 2 weeks on, one-week off -Her ANC is 1.6 today, lab reviewed, adequate for treatment, we'll start next cycle tomorrow.   2. Recurrent pneumonia -Resolved. Repeated chest x-ray on 11/02/2014 was normal.  2. Left lower premolar tooth infection -She has had the tooth extraction. -Continue Zometa.   3. Pancytopenia due to chemotherapy. -Continue monitoring  3. Osteoporosis -recently repeated bone density scan showed worsening osteoporosis, will continue zometa monthly for additional 6 month then change to every 3 months after 03/2015.    Plan -Zometa infusion today, And continue monthly  -Start Revlimid tomorrow -Return to clinic on July 18, and restart next cycle Revlimid if blood counts adequate   All questions were answered. The patient knows to call the clinic with any problems, questions or concerns. We can certainly see the patient much sooner if necessary.  I spent 20 minutes counseling the patient face to face. The total time spent in the appointment was 25 minutes.   [name]  01/04/2015

## 2015-01-09 ENCOUNTER — Other Ambulatory Visit: Payer: Self-pay

## 2015-01-17 ENCOUNTER — Other Ambulatory Visit: Payer: Self-pay

## 2015-01-17 ENCOUNTER — Other Ambulatory Visit: Payer: Self-pay | Admitting: *Deleted

## 2015-01-18 ENCOUNTER — Other Ambulatory Visit: Payer: Self-pay | Admitting: *Deleted

## 2015-01-18 MED ORDER — CLOPIDOGREL BISULFATE 75 MG PO TABS: 75.0000 mg | ORAL_TABLET | Freq: Every day | ORAL | Status: DC

## 2015-01-18 MED ORDER — ATORVASTATIN CALCIUM 40 MG PO TABS: 40.0000 mg | ORAL_TABLET | Freq: Every day | ORAL | Status: DC

## 2015-01-18 MED ORDER — METOPROLOL SUCCINATE ER 25 MG PO TB24: 25.0000 mg | ORAL_TABLET | Freq: Every day | ORAL | Status: DC

## 2015-01-23 ENCOUNTER — Other Ambulatory Visit: Payer: Self-pay | Admitting: *Deleted

## 2015-01-23 ENCOUNTER — Other Ambulatory Visit: Payer: Self-pay

## 2015-01-23 DIAGNOSIS — C9000 Multiple myeloma not having achieved remission: Secondary | ICD-10-CM

## 2015-01-23 MED ORDER — REVLIMID 2.5 MG PO CAPS: 2.5000 mg | ORAL_CAPSULE | Freq: Every day | ORAL | Status: DC

## 2015-01-25 ENCOUNTER — Telehealth: Payer: Self-pay | Admitting: Hematology

## 2015-01-25 ENCOUNTER — Telehealth: Payer: Self-pay | Admitting: *Deleted

## 2015-01-25 NOTE — Telephone Encounter (Signed)
per pof tos ch pt appt-cld & left pt a message of sch appt/time & date

## 2015-01-25 NOTE — Telephone Encounter (Signed)
Called pt at home and left message on voice mail requesting pt to call [employer] to take pt survey for Revlimid to be dispensed.  Gave pt phone number to [employer] to call.

## 2015-01-30 ENCOUNTER — Encounter: Payer: Self-pay | Admitting: Hematology

## 2015-01-30 ENCOUNTER — Ambulatory Visit (HOSPITAL_BASED_OUTPATIENT_CLINIC_OR_DEPARTMENT_OTHER): Payer: Medicare Other | Admitting: Hematology

## 2015-01-30 ENCOUNTER — Other Ambulatory Visit (HOSPITAL_BASED_OUTPATIENT_CLINIC_OR_DEPARTMENT_OTHER): Payer: Medicare Other

## 2015-01-30 ENCOUNTER — Other Ambulatory Visit: Payer: Self-pay

## 2015-01-30 ENCOUNTER — Ambulatory Visit (HOSPITAL_BASED_OUTPATIENT_CLINIC_OR_DEPARTMENT_OTHER): Payer: Medicare Other

## 2015-01-30 ENCOUNTER — Telehealth: Payer: Self-pay | Admitting: Hematology

## 2015-01-30 VITALS — BP 126/69 | HR 82 | Temp 98.4°F | Resp 17 | Ht 67.0 in | Wt 130.0 lb

## 2015-01-30 DIAGNOSIS — C9000 Multiple myeloma not having achieved remission: Secondary | ICD-10-CM | POA: Diagnosis not present

## 2015-01-30 LAB — CBC WITH DIFFERENTIAL/PLATELET
BASO%: 2.8 % — ABNORMAL HIGH (ref 0.0–2.0)
Basophils Absolute: 0.1 10*3/uL (ref 0.0–0.1)
EOS%: 4.5 % (ref 0.0–7.0)
Eosinophils Absolute: 0.1 10*3/uL (ref 0.0–0.5)
HCT: 33.3 % — ABNORMAL LOW (ref 34.8–46.6)
HGB: 11.1 g/dL — ABNORMAL LOW (ref 11.6–15.9)
LYMPH%: 24.1 % (ref 14.0–49.7)
MCH: 34.7 pg — ABNORMAL HIGH (ref 25.1–34.0)
MCHC: 33.2 g/dL (ref 31.5–36.0)
MCV: 104.5 fL — AB (ref 79.5–101.0)
MONO#: 0.4 10*3/uL (ref 0.1–0.9)
MONO%: 16.2 % — ABNORMAL HIGH (ref 0.0–14.0)
NEUT#: 1.2 10*3/uL — ABNORMAL LOW (ref 1.5–6.5)
NEUT%: 52.4 % (ref 38.4–76.8)
Platelets: 193 10*3/uL (ref 145–400)
RBC: 3.18 10*6/uL — ABNORMAL LOW (ref 3.70–5.45)
RDW: 14.4 % (ref 11.2–14.5)
WBC: 2.3 10*3/uL — ABNORMAL LOW (ref 3.9–10.3)
lymph#: 0.5 10*3/uL — ABNORMAL LOW (ref 0.9–3.3)

## 2015-01-30 LAB — COMPREHENSIVE METABOLIC PANEL (CC13)
ALBUMIN: 3.8 g/dL (ref 3.5–5.0)
ALT: 21 U/L (ref 0–55)
ANION GAP: 6 meq/L (ref 3–11)
AST: 27 U/L (ref 5–34)
Alkaline Phosphatase: 58 U/L (ref 40–150)
BILIRUBIN TOTAL: 0.79 mg/dL (ref 0.20–1.20)
BUN: 15.8 mg/dL (ref 7.0–26.0)
CHLORIDE: 109 meq/L (ref 98–109)
CO2: 26 meq/L (ref 22–29)
Calcium: 9.7 mg/dL (ref 8.4–10.4)
Creatinine: 0.8 mg/dL (ref 0.6–1.1)
EGFR: 74 mL/min/{1.73_m2} — ABNORMAL LOW (ref >90)
GLUCOSE: 84 mg/dL (ref 70–140)
POTASSIUM: 4.5 meq/L (ref 3.5–5.1)
Sodium: 140 mEq/L (ref 136–145)
TOTAL PROTEIN: 7 g/dL (ref 6.4–8.3)

## 2015-01-30 MED ORDER — ZOLEDRONIC ACID 4 MG/5ML IV CONC
3.5000 mg | Freq: Once | INTRAVENOUS | Status: AC
  Administered 2015-01-30: 3.5 mg via INTRAVENOUS
  Filled 2015-01-30: qty 4.38

## 2015-01-30 NOTE — Progress Notes (Signed)
Please draw CBC and C-met today per Dr. Burr Medico.

## 2015-01-30 NOTE — Patient Instructions (Signed)
Zoledronic Acid injection (Hypercalcemia, Oncology) (Zometa) What is this medicine? ZOLEDRONIC ACID (ZOE le dron ik AS id) lowers the amount of calcium loss from bone. It is used to treat too much calcium in your blood from cancer. It is also used to prevent complications of cancer that has spread to the bone. This medicine may be used for other purposes; ask your health care provider or pharmacist if you have questions. COMMON BRAND NAME(S): Zometa What should I tell my health care provider before I take this medicine? They need to know if you have any of these conditions: -aspirin-sensitive asthma -cancer, especially if you are receiving medicines used to treat cancer -dental disease or wear dentures -infection -kidney disease -receiving corticosteroids like dexamethasone or prednisone -an unusual or allergic reaction to zoledronic acid, other medicines, foods, dyes, or preservatives -pregnant or trying to get pregnant -breast-feeding How should I use this medicine? This medicine is for infusion into a vein. It is given by a health care professional in a hospital or clinic setting. Talk to your pediatrician regarding the use of this medicine in children. Special care may be needed. Overdosage: If you think you have taken too much of this medicine contact a poison control center or emergency room at once. NOTE: This medicine is only for you. Do not share this medicine with others. What if I miss a dose? It is important not to miss your dose. Call your doctor or health care professional if you are unable to keep an appointment. What may interact with this medicine? -certain antibiotics given by injection -NSAIDs, medicines for pain and inflammation, like ibuprofen or naproxen -some diuretics like bumetanide, furosemide -teriparatide -thalidomide This list may not describe all possible interactions. Give your health care provider a list of all the medicines, herbs, non-prescription drugs,  or dietary supplements you use. Also tell them if you smoke, drink alcohol, or use illegal drugs. Some items may interact with your medicine. What should I watch for while using this medicine? Visit your doctor or health care professional for regular checkups. It may be some time before you see the benefit from this medicine. Do not stop taking your medicine unless your doctor tells you to. Your doctor may order blood tests or other tests to see how you are doing. Women should inform their doctor if they wish to become pregnant or think they might be pregnant. There is a potential for serious side effects to an unborn child. Talk to your health care professional or pharmacist for more information. You should make sure that you get enough calcium and vitamin D while you are taking this medicine. Discuss the foods you eat and the vitamins you take with your health care professional. Some people who take this medicine have severe bone, joint, and/or muscle pain. This medicine may also increase your risk for jaw problems or a broken thigh bone. Tell your doctor right away if you have severe pain in your jaw, bones, joints, or muscles. Tell your doctor if you have any pain that does not go away or that gets worse. Tell your dentist and dental surgeon that you are taking this medicine. You should not have major dental surgery while on this medicine. See your dentist to have a dental exam and fix any dental problems before starting this medicine. Take good care of your teeth while on this medicine. Make sure you see your dentist for regular follow-up appointments. What side effects may I notice from receiving this medicine? Side effects   that you should report to your doctor or health care professional as soon as possible: -allergic reactions like skin rash, itching or hives, swelling of the face, lips, or tongue -anxiety, confusion, or depression -breathing problems -changes in vision -eye pain -feeling faint  or lightheaded, falls -jaw pain, especially after dental work -mouth sores -muscle cramps, stiffness, or weakness -trouble passing urine or change in the amount of urine Side effects that usually do not require medical attention (report to your doctor or health care professional if they continue or are bothersome): -bone, joint, or muscle pain -constipation -diarrhea -fever -hair loss -irritation at site where injected -loss of appetite -nausea, vomiting -stomach upset -trouble sleeping -trouble swallowing -weak or tired This list may not describe all possible side effects. Call your doctor for medical advice about side effects. You may report side effects to FDA at 1-800-FDA-1088. Where should I keep my medicine? This drug is given in a hospital or clinic and will not be stored at home. NOTE: This sheet is a summary. It may not cover all possible information. If you have questions about this medicine, talk to your doctor, pharmacist, or health care provider.  2015, [employer]vier/Gold Standard. (2012-12-10 13:03:13)  

## 2015-01-30 NOTE — Telephone Encounter (Signed)
appointments complete per 7/18 pof. Patient will get avs report and appointments while in inf. Patient aware.

## 2015-01-30 NOTE — Progress Notes (Signed)
[place], MD [name] [address]  DIAGNOSIS: Multiple myeloma  CHIEF COMPLAINS: Follow-up of multiple myeloma  CURRENT TREATMENT:  Zometa 3.5 mg monthly started on 06/2013;  Revlimid 5 mg daily for 21 days then off for 7 days for maintenance for at least 2 years started on 09/02/2013.  Decreased to Revlimid 2.5 mg daily for 21 days then off for 7 days on 12/30/2013. Chemo was held multiple times due to hospitalization and infection.     Multiple myeloma   05/26/2012 Initial Diagnosis Presenting IgG was 4,040 mg/dL on 06/05/2012 (IgA 35; IgM 34); kappa free light chain 34.4 mg/dL, lambda 0.00, kappa:lambda ratio of 34.75; SPEP with M-spike of 2.60.   06/30/2012 Imaging Numerous lytic lesions throughout the calvarium.  Lytic lesion within the left lateral scapula.  Findings most compatible with metastases or myeloma. Multiple mid and lower thoracic compression fractures.  Slight compression through the endplates at L3.   81/82/9937 Bone Marrow Biopsy Bone marrow biopsy showed 49% plasma cell. Normal classical cytogenetics; however, myeloma FISH panel showed 13q- (intermediate risk)       07/14/2012 - 07/27/2012 Radiation Therapy Palliative radiation 20 Gy over 10 fractions between to thoracic spine cord compression and symptomatic left scapula lytic lesion.   08/10/2012 -  Chemotherapy Started SQ Velcade once weekly, 3 weeks on, 1 weeks off; daily Revlimid d1-21, 7 days off; and Dexamethasone 33m PO weekly.    02/12/2013 Bone Marrow Transplant [facility] bone marrow transplant [facility]    06/14/2013 Tumor Marker IgG  1830,  Kappa:lamba ratio, 1.69 (baseline was 34.75 or   M-spike 0.28 (baseline of 2.6 or  89.3% of baseline).     06/25/2013 -  Chemotherapy Started zometa 3.5 mg monthly    09/01/2013 -  Chemotherapy Maintenance therapy with revlimid 576mdaily for 14 days and then off for 7 (decreased from 21 days  on and 7 days off based on neutropenia).    09/13/2013 - 09/17/2014 Hospital Admission Hospital admission for pneumonia   12/20/2013 Treatment Plan Change Maintenance therapy decreased to 2.5 mg daily for 21 days and then off for 7 days based on low counts.    01/25/2014 Tumor Marker IgG 1360 M spike 0.5   03/01/2014 - 03/05/2014 [facility], received thrombolytic therapy, cath showed 60% prox RCA, mid-distal RCA 50%, Echo normal, no stents.   04/11/2014 - 08/07/2014 Chemotherapy Continue Revlimid at 2.5 mg 3 weeks on 1 week off   08/08/2014 - 08/13/2014 Hospital Admission Hospital admission for pneumonia. Her Revlimid was held.   08/29/2014 - 09/14/2014 Chemotherapy Maintenance Revlimid restarted   09/14/2014 - 09/17/2014 Hospital Admission [place].   11/13/2014 -  Chemotherapy Maintenance Revlimid restarted, changed to 2.26m76maily, 2 weks on, 1 week off from 12/15/2014    INTERVAL HISTORY:  [name] 74o. female with a past medical history of MM as detailed above, s/p SCT is here for follow-up.   She finished last cycle Revlimid last Wednesday, but did not start next cycle due to her appointment scheduling issue. She is clinically doing well. She is a mild chronic joint pain, and worsening left shoulder pain  possibly because pulled muscle left of her left shoulder when she was sailing a few weeks ago. She otherwise denies any fever or chills,  no cough or diarrhea. She remains to be physically active, and bikes regularly.   MEDICAL HISTORY: Past Medical History  Diagnosis Date  . Multiple myeloma 07/01/2012  . Unspecified deficiency anemia   . OSTEOPOROSIS 06/11/2010    Multiple compression fractures; and spontaneous fracture of sternum Qualifier: Diagnosis of  By: [name], [name]     . 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 Cord compression  07/13/12    MRI- diffuse myeloma involvement of T-L spine  . History of radiation therapy 07/13/12-07/27/12    spinal cord compression T3-T10,left scapula  . CAD (coronary artery disease)     a. inf STEMI (in [place] >>> [facility]) >>> LHC (8/15- [place]):  pRCA 60%, mid to dist RCA 50% >>> med rx  . Hx of echocardiogram     Echo (03/02/14-done at [facility]):  Normal LVEF without significant regional wall motion abnormalities. Mild      ALLERGIES:  is allergic to zosyn; zithromax; and ciprofloxacin.  MEDICATIONS: has a current medication list which includes the following prescription(s): acetaminophen, atorvastatin, calcium carbonate, clopidogrel, metoprolol succinate, morphine, proair hfa, vitamin d (cholecalciferol), and revlimid.  SURGICAL HISTORY:  Past Surgical History  Procedure Laterality Date  . Appendectomy    . Tubal ligation    . Cesarean section      x2   . Hip surgery  2009    left  . Elbow surgery    . Colonoscopy  2007    neg with Dr. [name]    REVIEW OF SYSTEMS:   Constitutional: Denies abnormal weight loss, (+) fatigue Eyes: Denies blurriness of vision Ears, nose, mouth, throat, and face: Denies mucositis or sore throat, (+) left tooth pain  Respiratory: Denies cough, dyspnea or wheezes Cardiovascular: Denies palpitation, chest discomfort or lower extremity swelling Gastrointestinal:  Denies nausea, heartburn or change in bowel habits Skin: Denies abnormal skin rashes Lymphatics: Denies new lymphadenopathy or easy bruising Neurological:Denies numbness, tingling or new weaknesses Behavioral/Psych: Mood is stable, no new changes  All other systems were reviewed with the patient and are negative.  PHYSICAL EXAMINATION: ECOG PERFORMANCE STATUS: 0  Blood pressure 126/69, pulse 82, temperature 98.4 F (36.9 C), temperature source Oral, resp. rate 17, height 5' 7"  (1.702 m), weight 130 lb (58.968 kg), SpO2 97  %.  GENERAL:alert, no distress and comfortable; easily mobile to exam table; kyphosis, moderate.  SKIN: skin color, texture, turgor are normal, no rashes or significant lesions; Abrasion well healed on RLE.  EYES: normal, Conjunctiva are pink, 1 tear/injectionof blood vessel left eye in sclera. OROPHARYNX:no exudate, no erythema and lips, buccal mucosa, and tongue normal, no ONJ NECK: supple, thyroid normal size, non-tender, without nodularity; nodules cervical bilaterally.  LYMPH:  no palpable lymphadenopathy in the cervical, axillary or supraclavicular LUNGS: crackles at the bases bilaterally with normal breathing effort, no wheezes or rhonchi HEART: regular rate & rhythm and no murmurs and no lower extremity edema ABDOMEN:abdomen soft, non-tender and normal bowel sounds Musculoskeletal:no cyanosis of digits and no clubbing  NEURO: alert & oriented x 3 with fluent speech, no focal motor/sensory deficits  Labs:  CBC Latest Ref Rng 01/04/2015 12/29/2014 12/13/2014  WBC 3.9 - 10.3 10e3/uL 3.0(L) 3.6(L) 2.8(L)  Hemoglobin 11.6 - 15.9 g/dL 11.4(L) 11.7 11.0(L)  Hematocrit 34.8 - 46.6 % 33.7(L) 35.0 32.8(L)  Platelets 145 - 400 10e3/uL 165 165 233    CMP Latest Ref Rng 01/04/2015 12/29/2014 12/13/2014  Glucose 70 - 140  mg/dl 107 88 89  BUN 7.0 - 26.0 mg/dL 17.2 15.6 16.8  Creatinine 0.6 - 1.1 mg/dL 0.8 0.8 0.8  Sodium 136 - 145 mEq/L 139 140 138  Potassium 3.5 - 5.1 mEq/L 4.0 3.9 4.1  Chloride 96 - 112 mEq/L - - -  CO2 22 - 29 mEq/L 23 26 24   Calcium 8.4 - 10.4 mg/dL 8.9 9.3 8.5  Total Protein 6.4 - 8.3 g/dL 6.9 7.7 7.3  Total Bilirubin 0.20 - 1.20 mg/dL 0.64 0.99 0.84  Alkaline Phos 40 - 150 U/L 60 60 59  AST 5 - 34 U/L 42(H) 32 27  ALT 0 - 55 U/L 42 28 19   SPEP M-PROTEIN 11/21/2014: 0.5  Serum kappa, lambda light chains, and ratio  11/21/2014: 3.95, 2.08, 1.90   24 hr urine PEP 10/13/2014: (-)  RADIOGRAPHIC STUDIES: None  ASSESSMENT: Shannon Rush 74 y.o. female with a history  of Multiple Myeloma s/p autotransplant in August 2014 now on maintenence low dose Revlimid therapy  PLAN:   1. IgG kappa Multiple myeloma s/p Auto SCT, intermediate risk.  --MM markers demonstrate a very good response (M protein baseline 2.6, down to 0.28 after induction chemo and transplant, stable around 0.5 since July 2015,  Most recent 0.6 on 10/13/2014)  -Her quantitative IgG and kappa light chain level has slightly increased comparing to 2 months ago - Acyclovir has been stopped after 1 year of SCT.   -Revlimid was recently held due to her pneumonia, and restarted it again, and the reduced to 2.5 mg 2 weeks on, one-week off -Her lab results is pending, if adequate, she will restart next cycle of Revlimid tomorrow -SPEP and light chain levels are pending from today   2. Recurrent pneumonia -Resolved. Repeated chest x-ray on 11/02/2014 was normal.  2. Left lower premolar tooth infection -She has had the tooth extraction. -Continue Zometa, infusion today   3. Pancytopenia due to chemotherapy. -Continue monitoring  3. Osteoporosis -recently repeated bone density scan showed worsening osteoporosis, will continue zometa monthly for additional 6 month then change to every 3 months after 03/2015.    Plan -Zometa infusion today, And continue monthly until sep 2016 -Start Revlimid tomorrow if lab adequate  -Return to clinic in 3 weeks   All questions were answered. The patient knows to call the clinic with any problems, questions or concerns. We can certainly see the patient much sooner if necessary.  I spent 20 minutes counseling the patient face to face. The total time spent in the appointment was 25 minutes.   [name]  01/30/2015

## 2015-02-01 LAB — SPEP & IFE WITH QIG
ABNORMAL PROTEIN BAND1: 0.6 g/dL
ALBUMIN ELP: 3.8 g/dL (ref 3.8–4.8)
Alpha-1-Globulin: 0.3 g/dL (ref 0.2–0.3)
Alpha-2-Globulin: 0.6 g/dL (ref 0.5–0.9)
Beta 2: 0.3 g/dL (ref 0.2–0.5)
Beta Globulin: 0.4 g/dL (ref 0.4–0.6)
Gamma Globulin: 1.4 g/dL (ref 0.8–1.7)
IGM, SERUM: 41 mg/dL — AB (ref 52–322)
IgA: 117 mg/dL (ref 69–380)
IgG (Immunoglobin G), Serum: 1380 mg/dL (ref 690–1700)
TOTAL PROTEIN, SERUM ELECTROPHOR: 6.7 g/dL (ref 6.1–8.1)

## 2015-02-01 LAB — KAPPA/LAMBDA LIGHT CHAINS
Kappa free light chain: 3.92 mg/dL — ABNORMAL HIGH (ref 0.33–1.94)
Kappa:Lambda Ratio: 1.48 (ref 0.26–1.65)
LAMBDA FREE LGHT CHN: 2.64 mg/dL — AB (ref 0.57–2.63)

## 2015-02-02 DIAGNOSIS — M25512 Pain in left shoulder: Secondary | ICD-10-CM | POA: Diagnosis not present

## 2015-02-06 ENCOUNTER — Other Ambulatory Visit: Payer: Self-pay | Admitting: *Deleted

## 2015-02-07 DIAGNOSIS — Z961 Presence of intraocular lens: Secondary | ICD-10-CM | POA: Diagnosis not present

## 2015-02-07 DIAGNOSIS — H4423 Degenerative myopia, bilateral: Secondary | ICD-10-CM | POA: Diagnosis not present

## 2015-02-10 ENCOUNTER — Encounter: Payer: Self-pay | Admitting: Podiatry

## 2015-02-10 ENCOUNTER — Ambulatory Visit (INDEPENDENT_AMBULATORY_CARE_PROVIDER_SITE_OTHER): Payer: Medicare Other | Admitting: Podiatry

## 2015-02-10 VITALS — BP 109/65 | HR 76 | Resp 17

## 2015-02-10 DIAGNOSIS — Q828 Other specified congenital malformations of skin: Secondary | ICD-10-CM | POA: Diagnosis not present

## 2015-02-10 NOTE — Progress Notes (Signed)
Subjective:     Patient ID: Shannon Rush, female   DOB: 1941-02-08, 74 y.o.   MRN: [id number]  HPIThis patient presents with painful callus under the outside ball of left foot.  She says the callus becomes painful walking and wearing her shoes.  She is very active.   Review of Systems     Objective:   Physical Exam GENERAL APPEARANCE: Alert, conversant. Appropriately groomed. No acute distress.  VASCULAR: Pedal pulses palpable at 2/4 DP and PT bilateral.  Capillary refill time is immediate to all digits,  Proximal to distal cooling it warm to warm.  Digital hair growth is present bilateral  NEUROLOGIC: sensation is intact epicritically and protectively to 5.07 monofilament at 5/5 sites bilateral.  Light touch is intact bilateral, vibratory sensation intact bilateral, achilles tendon reflex is intact bilateral.  MUSCULOSKELETAL: acceptable muscle strength, tone and stability bilateral.  Intrinsic muscluature intact bilateral.  Rectus appearance of foot and digits noted bilateral.   DERMATOLOGIC: skin color, texture, and turgor are within normal limits.  No preulcerative lesions or ulcers  are seen, no interdigital maceration noted.  No open lesions present.  Digital nails are asymptomatic. No drainage noted. Porokeratosis sub 4th metatarsal left foot.      Assessment:     Porokeratosis left foot    Plan:     IE  Debride porokeratosis.  RTC prn  Discussed treatments with patient.

## 2015-02-15 ENCOUNTER — Other Ambulatory Visit: Payer: Self-pay | Admitting: *Deleted

## 2015-02-15 DIAGNOSIS — C9000 Multiple myeloma not having achieved remission: Secondary | ICD-10-CM

## 2015-02-15 MED ORDER — REVLIMID 2.5 MG PO CAPS: 2.5000 mg | ORAL_CAPSULE | Freq: Every day | ORAL | Status: DC

## 2015-02-17 ENCOUNTER — Other Ambulatory Visit (HOSPITAL_BASED_OUTPATIENT_CLINIC_OR_DEPARTMENT_OTHER): Payer: Medicare Other

## 2015-02-17 ENCOUNTER — Ambulatory Visit (HOSPITAL_BASED_OUTPATIENT_CLINIC_OR_DEPARTMENT_OTHER): Payer: Medicare Other | Admitting: Hematology

## 2015-02-17 ENCOUNTER — Encounter: Payer: Self-pay | Admitting: Hematology

## 2015-02-17 VITALS — BP 117/73 | HR 60 | Temp 98.2°F | Resp 18 | Ht 67.0 in | Wt 124.8 lb

## 2015-02-17 DIAGNOSIS — D61810 Antineoplastic chemotherapy induced pancytopenia: Secondary | ICD-10-CM | POA: Diagnosis not present

## 2015-02-17 DIAGNOSIS — C9000 Multiple myeloma not having achieved remission: Secondary | ICD-10-CM | POA: Diagnosis not present

## 2015-02-17 DIAGNOSIS — M810 Age-related osteoporosis without current pathological fracture: Secondary | ICD-10-CM

## 2015-02-17 LAB — CBC WITH DIFFERENTIAL/PLATELET
BASO%: 1 % (ref 0.0–2.0)
Basophils Absolute: 0 10*3/uL (ref 0.0–0.1)
EOS%: 3.7 % (ref 0.0–7.0)
Eosinophils Absolute: 0.1 10*3/uL (ref 0.0–0.5)
HCT: 35 % (ref 34.8–46.6)
HGB: 11.6 g/dL (ref 11.6–15.9)
LYMPH%: 18.9 % (ref 14.0–49.7)
MCH: 34.6 pg — ABNORMAL HIGH (ref 25.1–34.0)
MCHC: 33 g/dL (ref 31.5–36.0)
MCV: 104.7 fL — AB (ref 79.5–101.0)
MONO#: 0.3 10*3/uL (ref 0.1–0.9)
MONO%: 9.2 % (ref 0.0–14.0)
NEUT#: 2.5 10*3/uL (ref 1.5–6.5)
NEUT%: 67.2 % (ref 38.4–76.8)
Platelets: 163 10*3/uL (ref 145–400)
RBC: 3.34 10*6/uL — ABNORMAL LOW (ref 3.70–5.45)
RDW: 14.4 % (ref 11.2–14.5)
WBC: 3.7 10*3/uL — AB (ref 3.9–10.3)
lymph#: 0.7 10*3/uL — ABNORMAL LOW (ref 0.9–3.3)

## 2015-02-17 LAB — COMPREHENSIVE METABOLIC PANEL (CC13)
ALBUMIN: 3.8 g/dL (ref 3.5–5.0)
ALT: 19 U/L (ref 0–55)
ANION GAP: 4 meq/L (ref 3–11)
AST: 21 U/L (ref 5–34)
Alkaline Phosphatase: 52 U/L (ref 40–150)
BUN: 13.6 mg/dL (ref 7.0–26.0)
CO2: 29 mEq/L (ref 22–29)
CREATININE: 0.8 mg/dL (ref 0.6–1.1)
Calcium: 9.2 mg/dL (ref 8.4–10.4)
Chloride: 108 mEq/L (ref 98–109)
EGFR: 76 mL/min/{1.73_m2} — ABNORMAL LOW (ref >90)
GLUCOSE: 88 mg/dL (ref 70–140)
POTASSIUM: 4 meq/L (ref 3.5–5.1)
Sodium: 141 mEq/L (ref 136–145)
TOTAL PROTEIN: 7 g/dL (ref 6.4–8.3)
Total Bilirubin: 0.79 mg/dL (ref 0.20–1.20)

## 2015-02-17 NOTE — Progress Notes (Signed)
[facility] ONCOL[facility] OFF[facility] PROGRESS NOTE   [name],[name], MD [address] [place] [address]  DIAGNOSIS: Multiple myeloma  CHIEF COMPLAINS: Follow-up of multiple myeloma  CURRENT TREATMENT:  Zometa 3.5 mg monthly started on 06/2013;  Revlimid 5 mg daily for 21 days then off for 7 days for maintenance for at least 2 years started on 09/02/2013.  Decreased to Revlimid 2.5 mg daily for 21 days then off for 7 days on 12/30/2013, and changed to 2.5mg daily for 14 days then off for 7 days on 12/15/2014 due to multiple infection.      Multiple myeloma   05/26/2012 Initial Diagnosis Presenting IgG was 4,040 mg/dL on 06/05/2012 (IgA 35; IgM 34); kappa free light chain 34.4 mg/dL, lambda 0.00, kappa:lambda ratio of 34.75; SPEP with M-spike of 2.60.   06/30/2012 Imaging Numerous lytic lesions throughout the calvarium.  Lytic lesion within the left lateral scapula.  Findings most compatible with metastases or myeloma. Multiple mid and lower thoracic compression fractures.  Slight compression through the endplates at L3.   07/03/2012 Bone Marrow Biopsy Bone marrow biopsy showed 49% plasma cell. Normal classical cytogenetics; however, myeloma FISH panel showed 13q- (intermediate risk)       07/14/2012 - 07/27/2012 Radiation Therapy Palliative radiation 20 Gy over 10 fractions between to thoracic spine cord compression and symptomatic left scapula lytic lesion.   08/10/2012 -  Chemotherapy Started SQ Velcade once weekly, 3 weeks on, 1 weeks off; daily Revlimid d1-21, 7 days off; and Dexamethasone 40mg PO weekly.    02/12/2013 Bone Marrow Transplant Auto bone marrow transplant at [facility]     06/14/2013 Tumor Marker IgG  1830,  Kappa:lamba ratio, 1.69 (baseline was 34.75 or   M-spike 0.28 (baseline of 2.6 or  89.3% of baseline).     06/25/2013 -  Chemotherapy Started zometa 3.5 mg monthly    09/01/2013 -  Chemotherapy Maintenance therapy with revlimid 5mg daily for 14 days and then  off for 7 (decreased from 21 days on and 7 days off based on neutropenia).    09/13/2013 - 09/17/2014 Hospital Admission Hospital admission for pneumonia   12/20/2013 Treatment Plan Change Maintenance therapy decreased to 2.5 mg daily for 21 days and then off for 7 days based on low counts.    01/25/2014 Tumor Marker IgG 1360 M spike 0.5   03/01/2014 - 03/05/2014 Hospital Admission [facility] with an inferior STEMI, received thrombolytic therapy, cath showed 60% prox RCA, mid-distal RCA 50%, Echo normal, no stents.   04/11/2014 - 08/07/2014 Chemotherapy Continue Revlimid at 2.5 mg 3 weeks on 1 week off   08/08/2014 - 08/13/2014 Hospital Admission Hospital admission for pneumonia. Her Revlimid was held.   08/29/2014 - 09/14/2014 Chemotherapy Maintenance Revlimid restarted   09/14/2014 - 09/17/2014 Hospital Admission Admitted for pneumonia after coming back from a trip in [place]. Revlimid was held again.   11/13/2014 -  Chemotherapy Maintenance Revlimid restarted, changed to 2.5mg daily, 2 weks on, 1 week off from 12/15/2014    INTERVAL HISTORY:  Shannon Rush 74 y.o. female with a past medical history of MM as detailed above, s/p SCT is here for follow-up.   She finished last cycle Revlimid 4 days ago, and will restart next Monday. She is clinically doing very well, no fever, chill, or other new complaints. She does have mild dry cough in the morning, no chest pain or dyspnea. She has good appetite, eating well. She remains very physically active,   biking, gardening, etc. She takes Tylenol at bedtime.  MEDICAL HISTORY: Past Medical History  Diagnosis Date  . Multiple myeloma 07/01/2012  . Unspecified deficiency anemia   . OSTEOPOROSIS 06/11/2010    Multiple compression fractures; and spontaneous fracture of sternum Qualifier: Diagnosis of  By: Saxon NP, Susan     . 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 Cord compression 07/13/12    MRI- diffuse myeloma involvement of T-L  spine  . History of radiation therapy 07/13/12-07/27/12    spinal cord compression T3-T10,left scapula  . CAD (coronary artery disease)     a. inf STEMI (in [place] >>> [facility]) >>> LHC (8/15- [facility]):  pRCA 60%, mid to dist RCA 50% >>> med rx  . Hx of echocardiogram     Echo (03/02/14-done at [facility]):  Normal LVEF without significant regional wall motion abnormalities. Mild      ALLERGIES:  is allergic to zosyn; zithromax; and ciprofloxacin.  MEDICATIONS: has a current medication list which includes the following prescription(s): acetaminophen, atorvastatin, calcium carbonate, clopidogrel, metoprolol succinate, morphine, proair hfa, revlimid, and vitamin d (cholecalciferol).  SURGICAL HISTORY:  Past Surgical History  Procedure Laterality Date  . Appendectomy    . Tubal ligation    . Cesarean section      x2   . Hip surgery  2009    left  . Elbow surgery    . Colonoscopy  2007    neg with Dr. Magod    REVIEW OF SYSTEMS:   Constitutional: Denies abnormal weight loss, (+) fatigue Eyes: Denies blurriness of vision Ears, nose, mouth, throat, and face: Denies mucositis or sore throat, (+) left tooth pain  Respiratory: Denies cough, dyspnea or wheezes Cardiovascular: Denies palpitation, chest discomfort or lower extremity swelling Gastrointestinal:  Denies nausea, heartburn or change in bowel habits Skin: Denies abnormal skin rashes Lymphatics: Denies new lymphadenopathy or easy bruising Neurological:Denies numbness, tingling or new weaknesses Behavioral/Psych: Mood is stable, no new changes  All other systems were reviewed with the patient and are negative.  PHYSICAL EXAMINATION: ECOG PERFORMANCE STATUS: 0  Blood pressure 117/73, pulse 60, temperature 98.2 F (36.8 C), temperature source Oral, resp. rate 18, height 5' 7" (1.702 m), weight 124 lb 12.8 oz (56.609 kg), SpO2 98 %.  GENERAL:alert, no distress and comfortable;  easily mobile to exam table; kyphosis, moderate.  SKIN: skin color, texture, turgor are normal, no rashes or significant lesions; Abrasion well healed on RLE.  EYES: normal, Conjunctiva are pink, 1 tear/injectionof blood vessel left eye in sclera. OROPHARYNX:no exudate, no erythema and lips, buccal mucosa, and tongue normal, no ONJ NECK: supple, thyroid normal size, non-tender, without nodularity; nodules cervical bilaterally.  LYMPH:  no palpable lymphadenopathy in the cervical, axillary or supraclavicular LUNGS: crackles at the bases bilaterally with normal breathing effort, no wheezes or rhonchi HEART: regular rate & rhythm and no murmurs and no lower extremity edema ABDOMEN:abdomen soft, non-tender and normal bowel sounds Musculoskeletal:no cyanosis of digits and no clubbing  NEURO: alert & oriented x 3 with fluent speech, no focal motor/sensory deficits  Labs:  CBC Latest Ref Rng 02/17/2015 01/30/2015 01/04/2015  WBC 3.9 - 10.3 10e3/uL 3.7(L) 2.3(L) 3.0(L)  Hemoglobin 11.6 - 15.9 g/dL 11.6 11.1(L) 11.4(L)  Hematocrit 34.8 - 46.6 % 35.0 33.3(L) 33.7(L)  Platelets 145 - 400 10e3/uL 163 193 165    CMP Latest Ref Rng 02/17/2015 01/30/2015 01/04/2015  Glucose 70 - 140 mg/dl 88 84 107    BUN 7.0 - 26.0 mg/dL 13.6 15.8 17.2  Creatinine 0.6 - 1.1 mg/dL 0.8 0.8 0.8  Sodium 136 - 145 mEq/L 141 140 139  Potassium 3.5 - 5.1 mEq/L 4.0 4.5 4.0  Chloride 96 - 112 mEq/L - - -  CO2 22 - 29 mEq/L 29 26 23  Calcium 8.4 - 10.4 mg/dL 9.2 9.7 8.9  Total Protein 6.4 - 8.3 g/dL 7.0 7.0 6.9  Total Bilirubin 0.20 - 1.20 mg/dL 0.79 0.79 0.64  Alkaline Phos 40 - 150 U/L 52 58 60  AST 5 - 34 U/L 21 27 42(H)  ALT 0 - 55 U/L 19 21 42    SPEP M-PROTEIN 10/13/2014: 0.6 11/21/2014: 0.5 01/30/2015: 0.6  IgG (690-1700) 10/13/2014: 1710 11/21/2014: 1460 01/30/2015: 1380  Serum kappa, lambda light chains, and ratio  11/21/2014: 3.95, 2.08, 1.90  01/30/2015: 3.92, 2.64, 1.48  24 hr urine PEP 10/13/2014: (-) RADIOGRAPHIC  STUDIES: None  ASSESSMENT: Amora K Kamer 73 y.o. female with a history of Multiple Myeloma s/p autotransplant in August 2014 now on maintenence low dose Revlimid therapy  PLAN:   1. IgG kappa Multiple myeloma s/p Auto SCT, intermediate risk.  --MM markers demonstrate a very good response (M protein baseline 2.6, down to 0.28 after induction chemo and transplant, stable around 0.5 since July 2015,  Most recent 0.6 on 01/30/2015 -Her quantitative IgG and kappa light chain levels are stable  - Acyclovir has been stopped after 1 year of SCT.   -Revlimid was held due to her multiple pneumonia requiring hospitalization, and subsequently dose reduced to 2.5 mg 2 weeks on, one-week off, she has been tolerating low-dose Revlimid very well -Lab reviewed, no significant cytopenia, she will start next cycle next Monday -Continue baby aspirin for this thrombosis prophylaxis. She is driving to [place] in a few weeks, I suggest her to use compression stocks during the driving and stop and ambulatory every a few hours  -She is planning to have a trip to [place] in January or February for 3 weeks, I may hold Revlimid during her trip  2. Recurrent pneumonia -Resolved. Repeated chest x-ray on 11/02/2014 was normal.  2. Left lower premolar tooth infection -She has had the tooth extraction. -Continue Zometa, infusion today   3. Pancytopenia due to chemotherapy. -Continue monitoring  3. Osteoporosis -recently repeated bone density scan showed worsening osteoporosis, will continue zometa monthly for additional 6 month then change to every 3 months after 03/2015.    Plan -Start Revlimid 8/8  -Return to clinic on 8/29 before next cycle  -she is scheduled for zometa 8/15 and 9/12.   All questions were answered. The patient knows to call the clinic with any problems, questions or concerns. We can certainly see the patient much sooner if necessary.  I spent 20 minutes counseling the patient face to  face. The total time spent in the appointment was 25 minutes.   Feng, Yan  02/17/2015   [facility] ONCOLOGY OFFICE PROGRESS NOTE   [name],[name], MD [address]  [place] [address]  DIAGNOSIS: Multiple myeloma  CHIEF COMPLAINS: Follow-up of multiple myeloma  CURRENT TREATMENT:  Zometa 3.5 mg monthly started on 06/2013;  Revlimid 5 mg daily for 21 days then off for 7 days for maintenance for at least 2 years started on 09/02/2013.  Decreased to Revlimid 2.5 mg daily for 21 days then off for 7 days on 12/30/2013. Chemo was held multiple times due to hospitalization and infection.       Multiple myeloma   05/26/2012 Initial Diagnosis Presenting IgG was 4,040 mg/dL on 06/05/2012 (IgA 35; IgM 34); kappa free light chain 34.4 mg/dL, lambda 0.00, kappa:lambda ratio of 34.75; SPEP with M-spike of 2.60.   06/30/2012 Imaging Numerous lytic lesions throughout the calvarium.  Lytic lesion within the left lateral scapula.  Findings most compatible with metastases or myeloma. Multiple mid and lower thoracic compression fractures.  Slight compression through the endplates at L3.   88/05/314 Bone Marrow Biopsy Bone marrow biopsy showed 49% plasma cell. Normal classical cytogenetics; however, myeloma FISH panel showed 13q- (intermediate risk)       07/14/2012 - 07/27/2012 Radiation Therapy Palliative radiation 20 Gy over 10 fractions between to thoracic spine cord compression and symptomatic left scapula lytic lesion.   08/10/2012 -  Chemotherapy Started SQ Velcade once weekly, 3 weeks on, 1 weeks off; daily Revlimid d1-21, 7 days off; and Dexamethasone 134m PO weekly.    02/12/2013 [facility]    06/14/2013 Tumor Marker IgG  1830,  Kappa:lamba ratio, 1.69 (baseline was 34.75 or   M-spike 0.28 (baseline of 2.6 or  89.3% of baseline).     06/25/2013 -  Chemotherapy Started zometa 3.5 mg monthly    09/01/2013 -  Chemotherapy  Maintenance therapy with revlimid 559mdaily for 14 days and then off for 7 (decreased from 21 days on and 7 days off based on neutropenia).    09/13/2013 - 09/17/2014 Hospital Admission Hospital admission for pneumonia   12/20/2013 Treatment Plan Change Maintenance therapy decreased to 2.5 mg daily for 21 days and then off for 7 days based on low counts.    01/25/2014 Tumor Marker IgG 1360 M spike 0.5   03/01/2014 - 03/05/2014 [facility], received thrombolytic therapy, cath showed 60% prox RCA, mid-distal RCA 50%, Echo normal, no stents.   04/11/2014 - 08/07/2014 Chemotherapy Continue Revlimid at 2.5 mg 3 weeks on 1 week off   08/08/2014 - 08/13/2014 Hospital Admission Hospital admission for pneumonia. Her Revlimid was held.   08/29/2014 - 09/14/2014 Chemotherapy Maintenance Revlimid restarted   09/14/2014 - 09/17/2014 Hospital Admission [place].   11/13/2014 -  Chemotherapy Maintenance Revlimid restarted, changed to 2.34m29maily, 2 weks on, 1 week off from 12/15/2014    INTERVAL HISTORY:  ClaMARYHELEN LINDLER 2o. female with a past medical history of MM as detailed above, s/p SCT is here for follow-up.   She finished last cycle Revlimid last Wednesday, but did not start next cycle due to her appointment scheduling issue. She is clinically doing well. She is a mild chronic joint pain, and worsening left shoulder pain  possibly because pulled muscle left of her left shoulder when she was sailing a few weeks ago. She otherwise denies any fever or chills, no cough or diarrhea. She remains to be physically active, and bikes regularly.   MEDICAL HISTORY: Past Medical History  Diagnosis Date  . Multiple myeloma 07/01/2012  . Unspecified deficiency anemia   . OSTEOPOROSIS 06/11/2010    Multiple compression fractures; and spontaneous fracture of sternum Qualifier: Diagnosis  of  By: SaxZebedee Iba, SusManuela Schwartz  . 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 Cord compression 07/13/12    MRI- diffuse myeloma involvement of T-L spine  . History of radiation therapy 07/13/12-07/27/12  spinal cord compression T3-T10,left scapula  . CAD (coronary artery disease)     a. inf STEMI (in [place] >>> [facility]) >>> LHC (8/15- [facility]):  pRCA 60%, mid to dist RCA 50% >>> med rx  . Hx of echocardiogram     Echo (03/02/14-done at [facility]):  Normal LVEF without significant regional wall motion abnormalities. Mild      ALLERGIES:  is allergic to zosyn; zithromax; and ciprofloxacin.  MEDICATIONS: has a current medication list which includes the following prescription(s): acetaminophen, atorvastatin, calcium carbonate, clopidogrel, metoprolol succinate, morphine, proair hfa, revlimid, and vitamin d (cholecalciferol).  SURGICAL HISTORY:  Past Surgical History  Procedure Laterality Date  . Appendectomy    . Tubal ligation    . Cesarean section      x2   . Hip surgery  2009    left  . Elbow surgery    . Colonoscopy  2007    neg with Dr. Magod    REVIEW OF SYSTEMS:   Constitutional: Denies abnormal weight loss, (+) fatigue Eyes: Denies blurriness of vision Ears, nose, mouth, throat, and face: Denies mucositis or sore throat, (+) left tooth pain  Respiratory: Denies cough, dyspnea or wheezes Cardiovascular: Denies palpitation, chest discomfort or lower extremity swelling Gastrointestinal:  Denies nausea, heartburn or change in bowel habits Skin: Denies abnormal skin rashes Lymphatics: Denies new lymphadenopathy or easy bruising Neurological:Denies numbness, tingling or new weaknesses Behavioral/Psych: Mood is stable, no new changes  All other systems were reviewed with the patient and are negative.  PHYSICAL EXAMINATION: ECOG PERFORMANCE STATUS: 0  Blood pressure 117/73, pulse 60, temperature 98.2 F (36.8 C), temperature  source Oral, resp. rate 18, height 5' 7" (1.702 m), weight 124 lb 12.8 oz (56.609 kg), SpO2 98 %.  GENERAL:alert, no distress and comfortable; easily mobile to exam table; kyphosis, moderate.  SKIN: skin color, texture, turgor are normal, no rashes or significant lesions; Abrasion well healed on RLE.  EYES: normal, Conjunctiva are pink, 1 tear/injectionof blood vessel left eye in sclera. OROPHARYNX:no exudate, no erythema and lips, buccal mucosa, and tongue normal, no ONJ NECK: supple, thyroid normal size, non-tender, without nodularity; nodules cervical bilaterally.  LYMPH:  no palpable lymphadenopathy in the cervical, axillary or supraclavicular LUNGS: crackles at the bases bilaterally with normal breathing effort, no wheezes or rhonchi HEART: regular rate & rhythm and no murmurs and no lower extremity edema ABDOMEN:abdomen soft, non-tender and normal bowel sounds Musculoskeletal:no cyanosis of digits and no clubbing  NEURO: alert & oriented x 3 with fluent speech, no focal motor/sensory deficits  Labs:  CBC Latest Ref Rng 02/17/2015 01/30/2015 01/04/2015  WBC 3.9 - 10.3 10e3/uL 3.7(L) 2.3(L) 3.0(L)  Hemoglobin 11.6 - 15.9 g/dL 11.6 11.1(L) 11.4(L)  Hematocrit 34.8 - 46.6 % 35.0 33.3(L) 33.7(L)  Platelets 145 - 400 10e3/uL 163 193 165    CMP Latest Ref Rng 02/17/2015 01/30/2015 01/04/2015  Glucose 70 - 140 mg/dl 88 84 107  BUN 7.0 - 26.0 mg/dL 13.6 15.8 17.2  Creatinine 0.6 - 1.1 mg/dL 0.8 0.8 0.8  Sodium 136 - 145 mEq/L 141 140 139  Potassium 3.5 - 5.1 mEq/L 4.0 4.5 4.0  Chloride 96 - 112 mEq/L - - -  CO2 22 - 29 mEq/L 29 26 23  Calcium 8.4 - 10.4 mg/dL 9.2 9.7 8.9  Total Protein 6.4 - 8.3 g/dL 7.0 7.0 6.9  Total Bilirubin 0.20 - 1.20 mg/dL 0.79 0.79 0.64  Alkaline Phos 40 - 150 U/L 52   58 60  AST 5 - 34 U/L 21 27 42(H)  ALT 0 - 55 U/L 19 21 42   SPEP M-PROTEIN 10/13/2014: 0.6 11/21/2014: 0.5 01/30/2015: 0.6  IgG (690-1700) 10/13/2014: 1710 11/21/2014: 1460 01/30/2015: 1380  Serum  kappa, lambda light chains, and ratio  11/21/2014: 3.95, 2.08, 1.90  01/30/2015: 3.92, 2.64, 1.48  24 hr urine PEP 10/13/2014: (-)  RADIOGRAPHIC STUDIES: None  ASSESSMENT: Shannon Rush 73 y.o. female with a history of Multiple Myeloma s/p autotransplant in August 2014 now on maintenence low dose Revlimid therapy  PLAN:   1. IgG kappa Multiple myeloma s/p Auto SCT, intermediate risk.  --MM markers demonstrate a very good response (M protein baseline 2.6, down to 0.28 after induction chemo and transplant, stable around 0.5 since July 2015,  Most recent 0.6 on 01/30/2015 -Her quantitative IgG and kappa light chain level has slightly increased comparing to 2 months ago - Acyclovir has been stopped after 1 year of SCT.   -Revlimid was recently held due to her pneumonia, and restarted it again, and the reduced to 2.5 mg 2 weeks on, one-week off -Her lab results is pending, if adequate, she will restart next cycle of Revlimid tomorrow -SPEP and light chain levels are pending from today   2. Recurrent pneumonia -Resolved. Repeated chest x-ray on 11/02/2014 was normal.  2. Left lower premolar tooth infection -She has had the tooth extraction. -Continue Zometa, infusion today   3. Pancytopenia due to chemotherapy. -Continue monitoring  3. Osteoporosis -recently repeated bone density scan showed worsening osteoporosis, will continue zometa monthly for additional 6 month then change to every 3 months after 03/2015.    Plan -Zometa infusion today, And continue monthly until sep 2016 -Start Revlimid tomorrow if lab adequate  -Return to clinic in 3 weeks   All questions were answered. The patient knows to call the clinic with any problems, questions or concerns. We can certainly see the patient much sooner if necessary.  I spent 20 minutes counseling the patient face to face. The total time spent in the appointment was 25 minutes.   Feng, Yan  02/17/2015    

## 2015-02-21 ENCOUNTER — Other Ambulatory Visit: Payer: Self-pay | Admitting: *Deleted

## 2015-02-21 DIAGNOSIS — C9000 Multiple myeloma not having achieved remission: Secondary | ICD-10-CM

## 2015-02-21 MED ORDER — REVLIMID 2.5 MG PO CAPS: 2.5000 mg | ORAL_CAPSULE | Freq: Every day | ORAL | Status: DC

## 2015-02-27 ENCOUNTER — Ambulatory Visit (HOSPITAL_BASED_OUTPATIENT_CLINIC_OR_DEPARTMENT_OTHER): Payer: Medicare Other

## 2015-02-27 VITALS — BP 108/68 | HR 69 | Temp 97.4°F

## 2015-02-27 DIAGNOSIS — C9000 Multiple myeloma not having achieved remission: Secondary | ICD-10-CM | POA: Diagnosis not present

## 2015-02-27 MED ORDER — SODIUM CHLORIDE 0.9 % IV SOLN
Freq: Once | INTRAVENOUS | Status: AC
  Administered 2015-02-27: 16:00:00 via INTRAVENOUS

## 2015-02-27 MED ORDER — SODIUM CHLORIDE 0.9 % IV SOLN
3.5000 mg | Freq: Once | INTRAVENOUS | Status: AC
  Administered 2015-02-27: 3.5 mg via INTRAVENOUS
  Filled 2015-02-27: qty 4.38

## 2015-02-27 NOTE — Patient Instructions (Signed)

## 2015-03-08 ENCOUNTER — Telehealth: Payer: Self-pay | Admitting: Hematology

## 2015-03-08 NOTE — Telephone Encounter (Signed)
pt cld & left voice mail wanting to know next appt time-cld & left pt a message and adv 8/26 appt @10am 

## 2015-03-09 ENCOUNTER — Telehealth: Payer: Self-pay | Admitting: Hematology

## 2015-03-09 ENCOUNTER — Telehealth: Payer: Self-pay | Admitting: *Deleted

## 2015-03-09 ENCOUNTER — Other Ambulatory Visit: Payer: Self-pay | Admitting: *Deleted

## 2015-03-09 NOTE — Telephone Encounter (Signed)
Pt called and left message that she will not make appts for 03/10/15 due to pt is out of town, and will not be back until late "Sunday night.  Spoke with pt and was informed that pt could come in for lab on Mon 8/29, and collaborative nurse could call pt with instructions from Dr. Feng about when to restart Revlimid. Pt is currently on her week off.   Pt is due to restart Revlimid on  Mon  03/13/15. Gave pt several appt times to see Dr. Feng on 8/29 ,  but none could work out for pt.   Pt stated she could come in late in the afternoon on Mon for office visit. Message to Dr. Feng for review. Pt's   Phone   Now     [phone]" [phone].

## 2015-03-09 NOTE — Telephone Encounter (Signed)
OK to see her at 4pm on Monday, if that works for her.  [name]  03/09/2015

## 2015-03-09 NOTE — Telephone Encounter (Signed)
pt cld to CX appt for 8/26-no r/s mad

## 2015-03-09 NOTE — Telephone Encounter (Signed)
Left message to confirm appointment for 08/29

## 2015-03-10 ENCOUNTER — Ambulatory Visit: Payer: Self-pay | Admitting: Hematology

## 2015-03-10 ENCOUNTER — Other Ambulatory Visit: Payer: Self-pay

## 2015-03-13 ENCOUNTER — Ambulatory Visit (HOSPITAL_BASED_OUTPATIENT_CLINIC_OR_DEPARTMENT_OTHER): Payer: Medicare Other | Admitting: Hematology

## 2015-03-13 ENCOUNTER — Telehealth: Payer: Self-pay | Admitting: Hematology

## 2015-03-13 ENCOUNTER — Other Ambulatory Visit (HOSPITAL_BASED_OUTPATIENT_CLINIC_OR_DEPARTMENT_OTHER): Payer: Medicare Other

## 2015-03-13 ENCOUNTER — Encounter: Payer: Self-pay | Admitting: Hematology

## 2015-03-13 VITALS — BP 119/72 | HR 64 | Temp 98.0°F | Resp 17 | Ht 67.0 in | Wt 125.8 lb

## 2015-03-13 DIAGNOSIS — M810 Age-related osteoporosis without current pathological fracture: Secondary | ICD-10-CM

## 2015-03-13 DIAGNOSIS — C9000 Multiple myeloma not having achieved remission: Secondary | ICD-10-CM

## 2015-03-13 DIAGNOSIS — D61818 Other pancytopenia: Secondary | ICD-10-CM | POA: Diagnosis not present

## 2015-03-13 LAB — CBC WITH DIFFERENTIAL/PLATELET
BASO%: 1.1 % (ref 0.0–2.0)
Basophils Absolute: 0 10*3/uL (ref 0.0–0.1)
EOS%: 1.9 % (ref 0.0–7.0)
Eosinophils Absolute: 0.1 10*3/uL (ref 0.0–0.5)
HEMATOCRIT: 33.6 % — AB (ref 34.8–46.6)
HEMOGLOBIN: 11.4 g/dL — AB (ref 11.6–15.9)
LYMPH%: 22.3 % (ref 14.0–49.7)
MCH: 35.6 pg — ABNORMAL HIGH (ref 25.1–34.0)
MCHC: 33.9 g/dL (ref 31.5–36.0)
MCV: 105 fL — ABNORMAL HIGH (ref 79.5–101.0)
MONO#: 0.4 10*3/uL (ref 0.1–0.9)
MONO%: 12 % (ref 0.0–14.0)
NEUT%: 62.7 % (ref 38.4–76.8)
NEUTROS ABS: 2.3 10*3/uL (ref 1.5–6.5)
Platelets: 146 10*3/uL (ref 145–400)
RBC: 3.2 10*6/uL — ABNORMAL LOW (ref 3.70–5.45)
RDW: 15.1 % — AB (ref 11.2–14.5)
WBC: 3.6 10*3/uL — AB (ref 3.9–10.3)
lymph#: 0.8 10*3/uL — ABNORMAL LOW (ref 0.9–3.3)

## 2015-03-13 LAB — COMPREHENSIVE METABOLIC PANEL (CC13)
ALK PHOS: 52 U/L (ref 40–150)
ALT: 27 U/L (ref 0–55)
AST: 31 U/L (ref 5–34)
Albumin: 4.1 g/dL (ref 3.5–5.0)
Anion Gap: 6 mEq/L (ref 3–11)
BUN: 19 mg/dL (ref 7.0–26.0)
CALCIUM: 9.3 mg/dL (ref 8.4–10.4)
CO2: 25 mEq/L (ref 22–29)
CREATININE: 0.8 mg/dL (ref 0.6–1.1)
Chloride: 110 mEq/L — ABNORMAL HIGH (ref 98–109)
EGFR: 74 mL/min/{1.73_m2} — ABNORMAL LOW (ref >90)
GLUCOSE: 80 mg/dL (ref 70–140)
Potassium: 3.8 mEq/L (ref 3.5–5.1)
SODIUM: 141 meq/L (ref 136–145)
TOTAL PROTEIN: 7.4 g/dL (ref 6.4–8.3)
Total Bilirubin: 0.69 mg/dL (ref 0.20–1.20)

## 2015-03-13 NOTE — Progress Notes (Signed)
Bracey, MD [place] [address]  DIAGNOSIS: Multiple myeloma - Plan: DG Bone Density  CHIEF COMPLAINS: Follow-up of multiple myeloma  CURRENT TREATMENT:  Zometa 3.5 mg monthly started on 06/2013;  Revlimid 5 mg daily for 21 days then off for 7 days for maintenance for at least 2 years started on 09/02/2013.  Decreased to Revlimid 2.5 mg daily for 21 days then off for 7 days on 12/30/2013, and changed to 2.100m daily for 14 days then off for 7 days on 12/15/2014 due to multiple infection.      Multiple myeloma   05/26/2012 Initial Diagnosis Presenting IgG was 4,040 mg/dL on 06/05/2012 (IgA 35; IgM 34); kappa free light chain 34.4 mg/dL, lambda 0.00, kappa:lambda ratio of 34.75; SPEP with M-spike of 2.60.   06/30/2012 Imaging Numerous lytic lesions throughout the calvarium.  Lytic lesion within the left lateral scapula.  Findings most compatible with metastases or myeloma. Multiple mid and lower thoracic compression fractures.  Slight compression through the endplates at L3.   103/00/9233Bone Marrow Biopsy Bone marrow biopsy showed 49% plasma cell. Normal classical cytogenetics; however, myeloma FISH panel showed 13q- (intermediate risk)       07/14/2012 - 07/27/2012 Radiation Therapy Palliative radiation 20 Gy over 10 fractions between to thoracic spine cord compression and symptomatic left scapula lytic lesion.   08/10/2012 -  Chemotherapy Started SQ Velcade once weekly, 3 weeks on, 1 weeks off; daily Revlimid d1-21, 7 days off; and Dexamethasone 420mPO weekly.    02/12/2013 Bone Marrow Transplant [facility]   06/14/2013 Tumor Marker IgG  1830,  Kappa:lamba ratio, 1.69 (baseline was 34.75 or   M-spike 0.28 (baseline of 2.6 or  89.3% of baseline).     06/25/2013 -  Chemotherapy Started zometa 3.5 mg monthly    09/01/2013 -  Chemotherapy Maintenance therapy with revlimid 89m30maily  for 14 days and then off for 7 (decreased from 21 days on and 7 days off based on neutropenia).    09/13/2013 - 09/17/2014 Hospital Admission Hospital admission for pneumonia   12/20/2013 Treatment Plan Change Maintenance therapy decreased to 2.5 mg daily for 21 days and then off for 7 days based on low counts.    01/25/2014 Tumor Marker IgG 1360 M spike 0.5   03/01/2014 - 03/05/2014 [facility], received thrombolytic therapy, cath showed 60% prox RCA, mid-distal RCA 50%, Echo normal, no stents.   04/11/2014 - 08/07/2014 Chemotherapy Continue Revlimid at 2.5 mg 3 weeks on 1 week off   08/08/2014 - 08/13/2014 Hospital Admission Hospital admission for pneumonia. Her Revlimid was held.   08/29/2014 - 09/14/2014 Chemotherapy Maintenance Revlimid restarted   09/14/2014 - 09/17/2014 Hospital Admission [place]mitted for pneumonia [place].   11/13/2014 -  Chemotherapy Maintenance Revlimid restarted, changed to 2.89mg27mily, 2 weks on, 1 week off from 12/15/2014    INTERVAL HISTORY:  Shannon HAYWORTHy74. female with a past medical history of MM as detailed above, s/p SCT is here for follow-up.   She finished last cycle Revlimid one week ago, will restart today. She just came [place], and had a good time with her grandchildren. She is doing well, denies any new pain, cough, or  other symptoms. She has mild stable arthritic pain. She reports leg cramps lately, she takes calcium once a day, and does take some over-the-counter magnesium and potassium. She is scheduled to see Dr. [name] on September 7.  MEDICAL HISTORY: Past Medical History  Diagnosis Date  . Multiple myeloma 07/01/2012  . Unspecified deficiency anemia   . OSTEOPOROSIS 06/11/2010    Multiple compression fractures; and spontaneous fracture of sternum Qualifier:  Diagnosis of  By: [name], [name]     . 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 Cord compression 07/13/12    MRI- diffuse myeloma involvement of T-L spine  . History of radiation therapy 07/13/12-07/27/12    spinal cord compression T3-T10,left scapula  . CAD (coronary artery disease)     a. inf STEMI (in [place] >>> [facility]) >>> LHC (8/15- [place]):  pRCA 60%, mid to dist RCA 50% >>> med rx  . Hx of echocardiogram     Echo (03/02/14-done at [facility]):  Normal LVEF without significant regional wall motion abnormalities. Mild      ALLERGIES:  is allergic to zosyn; zithromax; and ciprofloxacin.  MEDICATIONS: has a current medication list which includes the following prescription(s): acetaminophen, atorvastatin, calcium carbonate, clopidogrel, metoprolol succinate, morphine, proair hfa, revlimid, and vitamin d (cholecalciferol).  SURGICAL HISTORY:  Past Surgical History  Procedure Laterality Date  . Appendectomy    . Tubal ligation    . Cesarean section      x2   . Hip surgery  2009    left  . Elbow surgery    . Colonoscopy  2007    neg with Dr. [name]    REVIEW OF SYSTEMS:   Constitutional: Denies abnormal weight loss, (+) fatigue Eyes: Denies blurriness of vision Ears, nose, mouth, throat, and face: Denies mucositis or sore throat, (+) left tooth pain  Respiratory: Denies cough, dyspnea or wheezes Cardiovascular: Denies palpitation, chest discomfort or lower extremity swelling Gastrointestinal:  Denies nausea, heartburn or change in bowel habits Skin: Denies abnormal skin rashes Lymphatics: Denies new lymphadenopathy or easy bruising Neurological:Denies numbness, tingling or new weaknesses Behavioral/Psych: Mood is stable, no new changes  All other systems were reviewed with the patient and are negative.  PHYSICAL EXAMINATION: ECOG PERFORMANCE STATUS: 0  Blood pressure 119/72, pulse 64, temperature 98 F (36.7 C),  temperature source Oral, resp. rate 17, height 5' 7"  (1.702 m), weight 125 lb 12.8 oz (57.063 kg), SpO2 99 %.  GENERAL:alert, no distress and comfortable; easily mobile to exam table; kyphosis, moderate.  SKIN: skin color, texture, turgor are normal, no rashes or significant lesions; Abrasion well healed on RLE.  EYES: normal, Conjunctiva are pink, 1 tear/injectionof blood vessel left eye in sclera. OROPHARYNX:no exudate, no erythema and lips, buccal mucosa, and tongue normal, no ONJ NECK: supple, thyroid normal size, non-tender, without nodularity; nodules cervical bilaterally.  LYMPH:  no palpable lymphadenopathy in the cervical, axillary or supraclavicular LUNGS: crackles at the bases bilaterally with normal breathing effort, no wheezes or rhonchi HEART: regular rate & rhythm and no murmurs and no lower extremity edema ABDOMEN:abdomen soft, non-tender and normal bowel sounds Musculoskeletal:no cyanosis of digits and no clubbing, (+) spine scoliosis NEURO: alert & oriented x 3 with fluent speech, no focal motor/sensory deficits  Labs:  CBC Latest Ref Rng 03/13/2015 02/17/2015 01/30/2015  WBC 3.9 - 10.3 10e3/uL 3.6(L) 3.7(L) 2.3(L)  Hemoglobin 11.6 - 15.9 g/dL 11.4(L) 11.6 11.1(L)  Hematocrit 34.8 - 46.6 %  33.6(L) 35.0 33.3(L)  Platelets 145 - 400 10e3/uL 146 163 193    CMP Latest Ref Rng 03/13/2015 02/17/2015 01/30/2015  Glucose 70 - 140 mg/dl 80 88 84  BUN 7.0 - 26.0 mg/dL 19.0 13.6 15.8  Creatinine 0.6 - 1.1 mg/dL 0.8 0.8 0.8  Sodium 136 - 145 mEq/L 141 141 140  Potassium 3.5 - 5.1 mEq/L 3.8 4.0 4.5  Chloride 96 - 112 mEq/L - - -  CO2 22 - 29 mEq/L 25 29 26   Calcium 8.4 - 10.4 mg/dL 9.3 9.2 9.7  Total Protein 6.4 - 8.3 g/dL 7.4 7.0 7.0  Total Bilirubin 0.20 - 1.20 mg/dL 0.69 0.79 0.79  Alkaline Phos 40 - 150 U/L 52 52 58  AST 5 - 34 U/L 31 21 27   ALT 0 - 55 U/L 27 19 21     SPEP M-PROTEIN 10/13/2014: 0.6 11/21/2014: 0.5 01/30/2015: 0.6  IgG [phone]) 10/13/2014: 1710 11/21/2014:  1460 01/30/2015: 1380  Serum kappa, lambda light chains, and ratio  11/21/2014: 3.95, 2.08, 1.90  01/30/2015: 3.92, 2.64, 1.48  24 hr urine PEP 10/13/2014: (-) RADIOGRAPHIC STUDIES: None  ASSESSMENT: Shannon Rush 74 y.o. female with a history of Multiple Myeloma s/p autotransplant in August 2014 now on maintenence low dose Revlimid therapy  PLAN:   1. IgG kappa Multiple myeloma s/p Auto SCT, intermediate risk.  --MM markers demonstrate a very good response (M protein baseline 2.6, down to 0.28 after induction chemo and transplant, stable around 0.5 since July 2015,  Most recent 0.6 on 01/30/2015 -Her quantitative IgG and kappa light chain levels are stable  - Acyclovir has been stopped after 1 year of SCT.   -Revlimid was held due to her multiple pneumonia requiring hospitalization, and subsequently dose reduced to 2.5 mg 2 weeks on, one-week off, she has been tolerating low-dose Revlimid very well -Lab reviewed, no significant cytopenia, she will start next cycle today -Continue baby aspirin for this thrombosis prophylaxis.  -She is planning to have a trip to [name] in January or February for 3 weeks, I may hold Revlimid during her trip --Continue Zometa monthly until next month, then change to every 3 month   2. Recurrent pneumonia -Resolved. Repeated chest x-ray on 11/02/2014 was normal.  2. Pancytopenia due to chemotherapy. -Continue monitoring  3. Osteoporosis -recently repeated bone density scan showed worsening osteoporosis, will continue zometa monthly for additional 6 month then change to every 3 months after 03/2015.    Plan -Start Revlimid 8/8  -Return to clinic on 8/29 before next cycle  -she is scheduled for zometa 8/15 and 9/12.   All questions were answered. The patient knows to call the clinic with any problems, questions or concerns. We can certainly see the patient much sooner if necessary.  I spent 20 minutes counseling the patient face to face. The total  time spent in the appointment was 25 minutes.   [name]  03/13/2015   [place] OFFICE PROGRESS NOTE   [name], MD [place] [address]  DIAGNOSIS: Multiple myeloma - Plan: DG Bone Density  CHIEF COMPLAINS: Follow-up of multiple myeloma  CURRENT TREATMENT:  Zometa 3.5 mg monthly started on 06/2013;  Revlimid 5 mg daily for 21 days then off for 7 days for maintenance for at least 2 years started on 09/02/2013.  Decreased to Revlimid 2.5 mg daily for 21 days then off for 7 days on 12/30/2013. Chemo was held multiple times due to hospitalization and infection.  Multiple myeloma   05/26/2012 Initial Diagnosis Presenting IgG was 4,040 mg/dL on 06/05/2012 (IgA 35; IgM 34); kappa free light chain 34.4 mg/dL, lambda 0.00, kappa:lambda ratio of 34.75; SPEP with M-spike of 2.60.   06/30/2012 Imaging Numerous lytic lesions throughout the calvarium.  Lytic lesion within the left lateral scapula.  Findings most compatible with metastases or myeloma. Multiple mid and lower thoracic compression fractures.  Slight compression through the endplates at L3.   67/06/4579 Bone Marrow Biopsy Bone marrow biopsy showed 49% plasma cell. Normal classical cytogenetics; however, myeloma FISH panel showed 13q- (intermediate risk)       07/14/2012 - 07/27/2012 Radiation Therapy Palliative radiation 20 Gy over 10 fractions between to thoracic spine cord compression and symptomatic left scapula lytic lesion.   08/10/2012 -  Chemotherapy Started SQ Velcade once weekly, 3 weeks on, 1 weeks off; daily Revlimid d1-21, 7 days off; and Dexamethasone 3m PO weekly.    02/12/2013 [facility]    06/14/2013 Tumor Marker IgG  1830,  Kappa:lamba ratio, 1.69 (baseline was 34.75 or   M-spike 0.28 (baseline of 2.6 or  89.3% of baseline).     06/25/2013 -  Chemotherapy Started zometa 3.5 mg monthly    09/01/2013 -   Chemotherapy Maintenance therapy with revlimid 549mdaily for 14 days and then off for 7 (decreased from 21 days on and 7 days off based on neutropenia).    09/13/2013 - 09/17/2014 Hospital Admission Hospital admission for pneumonia   12/20/2013 Treatment Plan Change Maintenance therapy decreased to 2.5 mg daily for 21 days and then off for 7 days based on low counts.    01/25/2014 Tumor Marker IgG 1360 M spike 0.5   03/01/2014 - 03/05/2014 [facility], received thrombolytic therapy, cath showed 60% prox RCA, mid-distal RCA 50%, Echo normal, no stents.   04/11/2014 - 08/07/2014 Chemotherapy Continue Revlimid at 2.5 mg 3 weeks on 1 week off   08/08/2014 - 08/13/2014 Hospital Admission Hospital admission for pneumonia. Her Revlimid was held.   08/29/2014 - 09/14/2014 Chemotherapy Maintenance Revlimid restarted   09/14/2014 - 09/17/2014 Hospital Admission [place] pneumonia [place].   11/13/2014 -  Chemotherapy Maintenance Revlimid restarted, changed to 2.42m80maily, 2 weks on, 1 week off from 12/15/2014    INTERVAL HISTORY:  Shannon Rush 39o. female with a past medical history of MM as detailed above, s/p SCT is here for follow-up.   She finished last cycle Revlimid last Wednesday, but did not start next cycle due to her appointment scheduling issue. She is clinically doing well. She is a mild chronic joint pain, and worsening left shoulder pain  possibly because pulled muscle left of her left shoulder when she was sailing a few weeks ago. She otherwise denies any fever or chills, no cough or diarrhea. She remains to be physically active, and bikes regularly.   MEDICAL HISTORY: Past Medical History  Diagnosis Date  . Multiple myeloma 07/01/2012  . Unspecified deficiency anemia   . OSTEOPOROSIS 06/11/2010    Multiple compression fractures; and spontaneous fracture of sternum  Qualifier: Diagnosis of  By: SaxZebedee Iba, SusManuela Schwartz  . 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 Cord compression 07/13/12    MRI- diffuse myeloma involvement of T-L spine  . History of radiation therapy 07/13/12-07/27/12  spinal cord compression T3-T10,left scapula  . CAD (coronary artery disease)     a. inf STEMI (in [place] >>> [facility]) >>> LHC (8/15- [place]):  pRCA 60%, mid to dist RCA 50% >>> med rx  . Hx of echocardiogram     Echo (03/02/14-done at [facility]):  Normal LVEF without significant regional wall motion abnormalities. Mild      ALLERGIES:  is allergic to zosyn; zithromax; and ciprofloxacin.  MEDICATIONS: has a current medication list which includes the following prescription(s): acetaminophen, atorvastatin, calcium carbonate, clopidogrel, metoprolol succinate, morphine, proair hfa, revlimid, and vitamin d (cholecalciferol).  SURGICAL HISTORY:  Past Surgical History  Procedure Laterality Date  . Appendectomy    . Tubal ligation    . Cesarean section      x2   . Hip surgery  2009    left  . Elbow surgery    . Colonoscopy  2007    neg with Dr. [name]    REVIEW OF SYSTEMS:   Constitutional: Denies abnormal weight loss, (+) fatigue Eyes: Denies blurriness of vision Ears, nose, mouth, throat, and face: Denies mucositis or sore throat, (+) left tooth pain  Respiratory: Denies cough, dyspnea or wheezes Cardiovascular: Denies palpitation, chest discomfort or lower extremity swelling Gastrointestinal:  Denies nausea, heartburn or change in bowel habits Skin: Denies abnormal skin rashes Lymphatics: Denies new lymphadenopathy or easy bruising Neurological:Denies numbness, tingling or new weaknesses Behavioral/Psych: Mood is stable, no new changes  All other systems were reviewed with the patient and are negative.  PHYSICAL EXAMINATION: ECOG PERFORMANCE STATUS: 0  Blood pressure 119/72, pulse 64, temperature 98 F  (36.7 C), temperature source Oral, resp. rate 17, height 5' 7"  (1.702 m), weight 125 lb 12.8 oz (57.063 kg), SpO2 99 %.  GENERAL:alert, no distress and comfortable; easily mobile to exam table; kyphosis, moderate.  SKIN: skin color, texture, turgor are normal, no rashes or significant lesions; Abrasion well healed on RLE.  EYES: normal, Conjunctiva are pink, 1 tear/injectionof blood vessel left eye in sclera. OROPHARYNX:no exudate, no erythema and lips, buccal mucosa, and tongue normal, no ONJ NECK: supple, thyroid normal size, non-tender, without nodularity; nodules cervical bilaterally.  LYMPH:  no palpable lymphadenopathy in the cervical, axillary or supraclavicular LUNGS: crackles at the bases bilaterally with normal breathing effort, no wheezes or rhonchi HEART: regular rate & rhythm and no murmurs and no lower extremity edema ABDOMEN:abdomen soft, non-tender and normal bowel sounds Musculoskeletal:no cyanosis of digits and no clubbing  NEURO: alert & oriented x 3 with fluent speech, no focal motor/sensory deficits  Labs:  CBC Latest Ref Rng 03/13/2015 02/17/2015 01/30/2015  WBC 3.9 - 10.3 10e3/uL 3.6(L) 3.7(L) 2.3(L)  Hemoglobin 11.6 - 15.9 g/dL 11.4(L) 11.6 11.1(L)  Hematocrit 34.8 - 46.6 % 33.6(L) 35.0 33.3(L)  Platelets 145 - 400 10e3/uL 146 163 193    CMP Latest Ref Rng 03/13/2015 02/17/2015 01/30/2015  Glucose 70 - 140 mg/dl 80 88 84  BUN 7.0 - 26.0 mg/dL 19.0 13.6 15.8  Creatinine 0.6 - 1.1 mg/dL 0.8 0.8 0.8  Sodium 136 - 145 mEq/L 141 141 140  Potassium 3.5 - 5.1 mEq/L 3.8 4.0 4.5  Chloride 96 - 112 mEq/L - - -  CO2 22 - 29 mEq/L 25 29 26   Calcium 8.4 - 10.4 mg/dL 9.3 9.2 9.7  Total Protein 6.4 - 8.3 g/dL 7.4 7.0 7.0  Total Bilirubin 0.20 - 1.20 mg/dL 0.69 0.79 0.79  Alkaline Phos 40 - 150 U/L 52  52 58  AST 5 - 34 U/L 31 21 27   ALT 0 - 55 U/L 27 19 21    SPEP M-PROTEIN 10/13/2014: 0.6 11/21/2014: 0.5 01/30/2015: 0.6  IgG ([id number]) 10/13/2014: 1710 11/21/2014:  1460 01/30/2015: 1380  Serum kappa, lambda light chains, and ratio  11/21/2014: 3.95, 2.08, 1.90  01/30/2015: 3.92, 2.64, 1.48  24 hr urine PEP 10/13/2014: (-)  RADIOGRAPHIC STUDIES: None  ASSESSMENT: Shannon Rush 74 y.o. female with a history of Multiple Myeloma s/p autotransplant in August 2014 now on maintenence low dose Revlimid therapy  PLAN:   1. IgG kappa Multiple myeloma s/p Auto SCT, intermediate risk.  --MM markers demonstrate a very good response (M protein baseline 2.6, down to 0.28 after induction chemo and transplant, stable around 0.5 since July 2015,  Most recent 0.6 on 01/30/2015 -Her quantitative IgG and kappa light chain level has slightly increased comparing to 2 months ago - Acyclovir has been stopped after 1 year of SCT.   -Revlimid was held in April due to her pneumonia, and restarted it again, and the reduced to 2.5 mg 2 weeks on, one-week off -I reviewed her lab results with her, mild leukopenia, adequate for treatment, she will restart next cycle of Revlimid today -SPEP and light chain levels are pending from today   2. Recurrent pneumonia -Resolved. Repeated chest x-ray on 11/02/2014 was normal.   3. Pancytopenia due to chemotherapy. -Continue monitoring  3. Osteoporosis -recently repeated bone density scan showed worsening osteoporosis, will continue zometa monthly for additional 6 month then change to every 3 months after 03/2015.  -repeat bone density scan in the next month    Plan -Start Revlimid today  -Return to clinic in 3 weeks for lab before next cycle, and I will see her back in 6 weeks. She will see Dr. [name] on 9/7 -bone density scan in [place] in the next month   All questions were answered. The patient knows to call the clinic with any problems, questions or concerns. We can certainly see the patient much sooner if necessary.  I spent 20 minutes counseling the patient face to face. The total time spent in the appointment was 25  minutes.   [name]  03/13/2015

## 2015-03-13 NOTE — Telephone Encounter (Signed)
Pt left without getting AVS or calendar states she wants [place] to call her with D/T due to she was trying to get out of here. Per MD moved Zometa toward the of the wk she is scheduled and used those labs then schedule .Marland Kitchen... KJ

## 2015-03-14 ENCOUNTER — Telehealth: Payer: Self-pay | Admitting: *Deleted

## 2015-03-14 DIAGNOSIS — M25512 Pain in left shoulder: Secondary | ICD-10-CM | POA: Diagnosis not present

## 2015-03-14 NOTE — Telephone Encounter (Signed)
Per staff message from scheduler I have moved appt from 9/12 to 9/16

## 2015-03-15 LAB — SPEP & IFE WITH QIG
Abnormal Protein Band1: 0.6 g/dL
Albumin ELP: 4.1 g/dL (ref 3.8–4.8)
Alpha-1-Globulin: 0.3 g/dL (ref 0.2–0.3)
Alpha-2-Globulin: 0.6 g/dL (ref 0.5–0.9)
BETA GLOBULIN: 0.4 g/dL (ref 0.4–0.6)
Beta 2: 0.2 g/dL (ref 0.2–0.5)
Gamma Globulin: 1.4 g/dL (ref 0.8–1.7)
IGA: 140 mg/dL (ref 69–380)
IGG (IMMUNOGLOBIN G), SERUM: 1570 mg/dL (ref 690–1700)
IgM, Serum: 37 mg/dL — ABNORMAL LOW (ref 52–322)
TOTAL PROTEIN, SERUM ELECTROPHOR: 7 g/dL (ref 6.1–8.1)

## 2015-03-15 LAB — KAPPA/LAMBDA LIGHT CHAINS
KAPPA FREE LGHT CHN: 3.55 mg/dL — AB (ref 0.33–1.94)
Kappa:Lambda Ratio: 1.64 (ref 0.26–1.65)
LAMBDA FREE LGHT CHN: 2.17 mg/dL (ref 0.57–2.63)

## 2015-03-22 DIAGNOSIS — Z9484 Stem cells transplant status: Secondary | ICD-10-CM | POA: Diagnosis not present

## 2015-03-22 DIAGNOSIS — C9000 Multiple myeloma not having achieved remission: Secondary | ICD-10-CM | POA: Diagnosis not present

## 2015-03-22 DIAGNOSIS — Z23 Encounter for immunization: Secondary | ICD-10-CM | POA: Diagnosis not present

## 2015-03-22 DIAGNOSIS — Z79899 Other long term (current) drug therapy: Secondary | ICD-10-CM | POA: Diagnosis not present

## 2015-03-27 ENCOUNTER — Other Ambulatory Visit (HOSPITAL_BASED_OUTPATIENT_CLINIC_OR_DEPARTMENT_OTHER): Payer: Medicare Other

## 2015-03-27 ENCOUNTER — Ambulatory Visit: Payer: Self-pay

## 2015-03-27 DIAGNOSIS — C9000 Multiple myeloma not having achieved remission: Secondary | ICD-10-CM

## 2015-03-27 LAB — COMPREHENSIVE METABOLIC PANEL (CC13)
ALBUMIN: 4 g/dL (ref 3.5–5.0)
ALK PHOS: 52 U/L (ref 40–150)
ALT: 23 U/L (ref 0–55)
ANION GAP: 6 meq/L (ref 3–11)
AST: 27 U/L (ref 5–34)
BUN: 15.6 mg/dL (ref 7.0–26.0)
CALCIUM: 9.4 mg/dL (ref 8.4–10.4)
CO2: 27 mEq/L (ref 22–29)
Chloride: 107 mEq/L (ref 98–109)
Creatinine: 0.8 mg/dL (ref 0.6–1.1)
EGFR: 72 mL/min/{1.73_m2} — AB (ref >90)
Glucose: 80 mg/dl (ref 70–140)
POTASSIUM: 3.9 meq/L (ref 3.5–5.1)
Sodium: 141 mEq/L (ref 136–145)
Total Bilirubin: 0.63 mg/dL (ref 0.20–1.20)
Total Protein: 7.3 g/dL (ref 6.4–8.3)

## 2015-03-27 LAB — CBC WITH DIFFERENTIAL/PLATELET
BASO%: 1.2 % (ref 0.0–2.0)
BASOS ABS: 0 10*3/uL (ref 0.0–0.1)
EOS ABS: 0.1 10*3/uL (ref 0.0–0.5)
EOS%: 3.2 % (ref 0.0–7.0)
HEMATOCRIT: 32.4 % — AB (ref 34.8–46.6)
HEMOGLOBIN: 10.9 g/dL — AB (ref 11.6–15.9)
LYMPH#: 0.6 10*3/uL — AB (ref 0.9–3.3)
LYMPH%: 15.8 % (ref 14.0–49.7)
MCH: 35.1 pg — AB (ref 25.1–34.0)
MCHC: 33.7 g/dL (ref 31.5–36.0)
MCV: 104.2 fL — AB (ref 79.5–101.0)
MONO#: 0.5 10*3/uL (ref 0.1–0.9)
MONO%: 13.8 % (ref 0.0–14.0)
NEUT#: 2.5 10*3/uL (ref 1.5–6.5)
NEUT%: 66 % (ref 38.4–76.8)
Platelets: 176 10*3/uL (ref 145–400)
RBC: 3.12 10*6/uL — ABNORMAL LOW (ref 3.70–5.45)
RDW: 15.2 % — AB (ref 11.2–14.5)
WBC: 3.8 10*3/uL — ABNORMAL LOW (ref 3.9–10.3)

## 2015-03-28 ENCOUNTER — Other Ambulatory Visit: Payer: Self-pay | Admitting: *Deleted

## 2015-03-28 ENCOUNTER — Telehealth: Payer: Self-pay | Admitting: Hematology

## 2015-03-28 DIAGNOSIS — C9000 Multiple myeloma not having achieved remission: Secondary | ICD-10-CM

## 2015-03-28 MED ORDER — REVLIMID 2.5 MG PO CAPS: 2.5000 mg | ORAL_CAPSULE | Freq: Every day | ORAL | Status: DC

## 2015-03-28 NOTE — Telephone Encounter (Signed)
S/w pt confirming Zometa apt and pt also declined again per her Bone Density schedule. She told me the day I tried to schedule her she would call her self if she decided and pt stated today she didn't want this scan and that she would discuss it with MD at next visit... KJ

## 2015-03-31 ENCOUNTER — Ambulatory Visit (HOSPITAL_BASED_OUTPATIENT_CLINIC_OR_DEPARTMENT_OTHER): Payer: Medicare Other

## 2015-03-31 DIAGNOSIS — C9000 Multiple myeloma not having achieved remission: Secondary | ICD-10-CM | POA: Diagnosis not present

## 2015-03-31 MED ORDER — SODIUM CHLORIDE 0.9 % IV SOLN
Freq: Once | INTRAVENOUS | Status: AC
  Administered 2015-03-31: 13:00:00 via INTRAVENOUS

## 2015-03-31 MED ORDER — ZOLEDRONIC ACID 4 MG/5ML IV CONC
3.5000 mg | Freq: Once | INTRAVENOUS | Status: AC
  Administered 2015-03-31: 3.5 mg via INTRAVENOUS
  Filled 2015-03-31: qty 4.38

## 2015-03-31 NOTE — Patient Instructions (Signed)

## 2015-04-14 ENCOUNTER — Other Ambulatory Visit: Payer: Self-pay | Admitting: Hematology

## 2015-04-17 ENCOUNTER — Other Ambulatory Visit: Payer: Self-pay | Admitting: *Deleted

## 2015-04-17 MED ORDER — REVLIMID 2.5 MG PO CAPS: 2.5000 mg | ORAL_CAPSULE | Freq: Every day | ORAL | Status: DC

## 2015-04-20 ENCOUNTER — Encounter: Payer: Self-pay | Admitting: Hematology

## 2015-04-20 ENCOUNTER — Telehealth: Payer: Self-pay | Admitting: Hematology

## 2015-04-20 ENCOUNTER — Other Ambulatory Visit (HOSPITAL_BASED_OUTPATIENT_CLINIC_OR_DEPARTMENT_OTHER): Payer: Medicare Other

## 2015-04-20 ENCOUNTER — Ambulatory Visit (HOSPITAL_BASED_OUTPATIENT_CLINIC_OR_DEPARTMENT_OTHER): Payer: Medicare Other | Admitting: Hematology

## 2015-04-20 VITALS — BP 118/60 | HR 71 | Temp 97.9°F | Resp 18 | Ht 67.0 in | Wt 127.0 lb

## 2015-04-20 DIAGNOSIS — Z23 Encounter for immunization: Secondary | ICD-10-CM | POA: Diagnosis not present

## 2015-04-20 DIAGNOSIS — C9000 Multiple myeloma not having achieved remission: Secondary | ICD-10-CM

## 2015-04-20 DIAGNOSIS — I2510 Atherosclerotic heart disease of native coronary artery without angina pectoris: Secondary | ICD-10-CM

## 2015-04-20 DIAGNOSIS — D61810 Antineoplastic chemotherapy induced pancytopenia: Secondary | ICD-10-CM | POA: Diagnosis not present

## 2015-04-20 DIAGNOSIS — M810 Age-related osteoporosis without current pathological fracture: Secondary | ICD-10-CM

## 2015-04-20 LAB — CBC WITH DIFFERENTIAL/PLATELET
BASO%: 1.9 % (ref 0.0–2.0)
BASOS ABS: 0.1 10*3/uL (ref 0.0–0.1)
EOS ABS: 0.1 10*3/uL (ref 0.0–0.5)
EOS%: 4.3 % (ref 0.0–7.0)
HEMATOCRIT: 34.7 % — AB (ref 34.8–46.6)
HEMOGLOBIN: 11.8 g/dL (ref 11.6–15.9)
LYMPH#: 0.6 10*3/uL — AB (ref 0.9–3.3)
LYMPH%: 22.3 % (ref 14.0–49.7)
MCH: 35.5 pg — AB (ref 25.1–34.0)
MCHC: 34.1 g/dL (ref 31.5–36.0)
MCV: 104.1 fL — AB (ref 79.5–101.0)
MONO#: 0.3 10*3/uL (ref 0.1–0.9)
MONO%: 9.4 % (ref 0.0–14.0)
NEUT%: 62.1 % (ref 38.4–76.8)
NEUTROS ABS: 1.7 10*3/uL (ref 1.5–6.5)
Platelets: 163 10*3/uL (ref 145–400)
RBC: 3.33 10*6/uL — ABNORMAL LOW (ref 3.70–5.45)
RDW: 15.2 % — AB (ref 11.2–14.5)
WBC: 2.7 10*3/uL — AB (ref 3.9–10.3)

## 2015-04-20 LAB — COMPREHENSIVE METABOLIC PANEL (CC13)
ALBUMIN: 4.1 g/dL (ref 3.5–5.0)
ALK PHOS: 52 U/L (ref 40–150)
ALT: 21 U/L (ref 0–55)
AST: 25 U/L (ref 5–34)
Anion Gap: 6 mEq/L (ref 3–11)
BILIRUBIN TOTAL: 0.76 mg/dL (ref 0.20–1.20)
BUN: 17.2 mg/dL (ref 7.0–26.0)
CALCIUM: 9.6 mg/dL (ref 8.4–10.4)
CO2: 26 mEq/L (ref 22–29)
Chloride: 109 mEq/L (ref 98–109)
Creatinine: 0.8 mg/dL (ref 0.6–1.1)
EGFR: 71 mL/min/{1.73_m2} — AB (ref >90)
GLUCOSE: 86 mg/dL (ref 70–140)
Potassium: 3.8 mEq/L (ref 3.5–5.1)
SODIUM: 141 meq/L (ref 136–145)
TOTAL PROTEIN: 7.4 g/dL (ref 6.4–8.3)

## 2015-04-20 MED ORDER — INFLUENZA VAC SPLIT QUAD 0.5 ML IM SUSY
0.5000 mL | PREFILLED_SYRINGE | Freq: Once | INTRAMUSCULAR | Status: AC
  Administered 2015-04-20: 0.5 mL via INTRAMUSCULAR
  Filled 2015-04-20: qty 0.5

## 2015-04-20 NOTE — Progress Notes (Signed)
[place], MD Shannon Rush [address]  DIAGNOSIS: Multiple myeloma not having achieved remission ([place])  CHIEF COMPLAINS: Follow-up of multiple myeloma  CURRENT TREATMENT:  Zometa 3.5 mg monthly started on 06/2013;  Revlimid 5 mg daily for 21 days then off for 7 days for maintenance for at least 2 years started on 09/02/2013.  Decreased to Revlimid 2.5 mg daily for 21 days then off for 7 days on 12/30/2013, and changed to 2.60m daily for 14 days then off for 7 days on 12/15/2014 due to multiple infection.      Multiple myeloma ([place]   05/26/2012 Initial Diagnosis Presenting IgG was 4,040 mg/dL on 06/05/2012 (IgA 35; IgM 34); kappa free light chain 34.4 mg/dL, lambda 0.00, kappa:lambda ratio of 34.75; SPEP with M-spike of 2.60.   06/30/2012 Imaging Numerous lytic lesions throughout the calvarium.  Lytic lesion within the left lateral scapula.  Findings most compatible with metastases or myeloma. Multiple mid and lower thoracic compression fractures.  Slight compression through the endplates at L3.   107/12/1975Bone Marrow Biopsy Bone marrow biopsy showed 49% plasma cell. Normal classical cytogenetics; however, myeloma FISH panel showed 13q- (intermediate risk)       07/14/2012 - 07/27/2012 Radiation Therapy Palliative radiation 20 Gy over 10 fractions between to thoracic spine cord compression and symptomatic left scapula lytic lesion.   08/10/2012 -  Chemotherapy Started SQ Velcade once weekly, 3 weeks on, 1 weeks off; daily Revlimid d1-21, 7 days off; and Dexamethasone 466mPO weekly.    02/12/2013 [facility]   06/14/2013 Tumor Marker IgG  1830,  Kappa:lamba ratio, 1.69 (baseline was 34.75 or   M-spike 0.28 (baseline of 2.6 or  89.3% of baseline).     06/25/2013 -  Chemotherapy Started zometa 3.5 mg monthly    09/01/2013 -  Chemotherapy Maintenance therapy with  revlimid 33m62maily for 14 days and then off for 7 (decreased from 21 days on and 7 days off based on neutropenia).    09/13/2013 - 09/17/2014 Hospital Admission Hospital admission for pneumonia   12/20/2013 Treatment Plan Change Maintenance therapy decreased to 2.5 mg daily for 21 days and then off for 7 days based on low counts.    01/25/2014 Tumor Marker IgG 1360 M spike 0.5   03/01/2014 - 03/05/2014 [facility], received thrombolytic therapy, cath showed 60% prox RCA, mid-distal RCA 50%, Echo normal, no stents.   04/11/2014 - 08/07/2014 Chemotherapy Continue Revlimid at 2.5 mg 3 weeks on 1 week off   08/08/2014 - 08/13/2014 Hospital Admission Hospital admission for pneumonia. Her Revlimid was held.   08/29/2014 - 09/14/2014 Chemotherapy Maintenance Revlimid restarted   09/14/2014 - 09/17/2014 Hospital Admission Admitted for pneumonia [place]   11/13/2014 -  Chemotherapy Maintenance Revlimid restarted, changed to 2.33mg31mily, 2 weks on, 1 week off from 12/15/2014    INTERVAL HISTORY:  [name] with a past medical history of MM as detailed above, s/p SCT is here for follow-up.   She is doing well overall. She remains very physically active, likes a lot. Mild back and joint pain is stable. She denies any episodes of fever, productive cough, or other infections. [facility]. No other new  complains. She is off Revlimid this week, and will restart next Monday.   MEDICAL HISTORY: Past Medical History  Diagnosis Date  . Multiple myeloma ([place]) 07/01/2012  . Unspecified deficiency anemia   . OSTEOPOROSIS 06/11/2010    Multiple compression fractures; and spontaneous fracture of sternum Qualifier: Diagnosis of  By: [name], [name]     . 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 Cord compression ([place]) 07/13/12    MRI- diffuse  myeloma involvement of T-L spine  . History of radiation therapy 07/13/12-07/27/12    spinal cord compression T3-T10,left scapula  . CAD (coronary artery disease)     a. inf STEMI (in [place] >>> lytics) >>> LHC (8/15- [place]):  pRCA 60%, mid to dist RCA 50% >>> med rx  . Hx of echocardiogram     E[facility]  Normal LVEF without significant regional wall motion abnormalities. Mild      ALLERGIES:  is allergic to zosyn; zithromax; and ciprofloxacin.  MEDICATIONS: has a current medication list which includes the following prescription(s): acetaminophen, atorvastatin, calcium carbonate, clopidogrel, metoprolol succinate, morphine, proair hfa, revlimid, and vitamin d (cholecalciferol).  SURGICAL HISTORY:  Past Surgical History  Procedure Laterality Date  . Appendectomy    . Tubal ligation    . Cesarean section      x2   . Hip surgery  2009    left  . Elbow surgery    . Colonoscopy  2007    neg with Dr. [name]    REVIEW OF SYSTEMS:   Constitutional: Denies abnormal weight loss, (+) fatigue Eyes: Denies blurriness of vision Ears, nose, mouth, throat, and face: Denies mucositis or sore throat, (+) left tooth pain  Respiratory: Denies cough, dyspnea or wheezes Cardiovascular: Denies palpitation, chest discomfort or lower extremity swelling Gastrointestinal:  Denies nausea, heartburn or change in bowel habits Skin: Denies abnormal skin rashes Lymphatics: Denies new lymphadenopathy or easy bruising Neurological:Denies numbness, tingling or new weaknesses Behavioral/Psych: Mood is stable, no new changes  All other systems were reviewed with the patient and are negative.  PHYSICAL EXAMINATION: ECOG PERFORMANCE STATUS: 0  There were no vitals taken for this visit.  GENERAL:alert, no distress and comfortable; easily mobile to exam table; kyphosis, moderate.  SKIN: skin color, texture, turgor are normal, no rashes  or significant lesions; Abrasion well healed on RLE.  EYES: normal, Conjunctiva are pink, 1 tear/injectionof blood vessel left eye in sclera. OROPHARYNX:no exudate, no erythema and lips, buccal mucosa, and tongue normal, no ONJ NECK: supple, thyroid normal size, non-tender, without nodularity; nodules cervical bilaterally.  LYMPH:  no palpable lymphadenopathy in the cervical, axillary or supraclavicular LUNGS: crackles at the bases bilaterally with normal breathing effort, no wheezes or rhonchi HEART: regular rate & rhythm and no murmurs and no lower extremity edema ABDOMEN:abdomen soft, non-tender and normal bowel sounds Musculoskeletal:no cyanosis of digits and no clubbing, (+) spine scoliosis NEURO: alert & oriented x 3 with fluent speech, no focal motor/sensory deficits  Labs:  CBC Latest Ref Rng 03/27/2015 03/13/2015 02/17/2015  WBC 3.9 - 10.3 10e3/uL 3.8(L) 3.6(L) 3.7(L)  Hemoglobin 11.6 - 15.9 g/dL 10.9(L) 11.4(L) 11.6  Hematocrit 34.8 - 46.6 % 32.4(L) 33.6(L) 35.0  Platelets 145 - 400 10e3/uL 176 146 163    CMP Latest Ref Rng 03/27/2015 03/13/2015 02/17/2015  Glucose 70 - 140 mg/dl 80 80 88  BUN 7.0 - 26.0 mg/dL 15.6 19.0 13.6  Creatinine 0.6 - 1.1  mg/dL 0.8 0.8 0.8  Sodium 136 - 145 mEq/L 141 141 141  Potassium 3.5 - 5.1 mEq/L 3.9 3.8 4.0  Chloride 96 - 112 mEq/L - - -  CO2 22 - 29 mEq/L 27 25 29   Calcium 8.4 - 10.4 mg/dL 9.4 9.3 9.2  Total Protein 6.4 - 8.3 g/dL 7.3 7.4 7.0  Total Bilirubin 0.20 - 1.20 mg/dL 0.63 0.69 0.79  Alkaline Phos 40 - 150 U/L 52 52 52  AST 5 - 34 U/L 27 31 21   ALT 0 - 55 U/L 23 27 19    SPEP M-PROTEIN 10/13/2014: 0.6 11/21/2014: 0.5 01/30/2015: 0.6 03/13/2015: 0.6  IgG ([phone]) 10/13/2014: 1710 11/21/2014: 1460 01/30/2015: 1380 03/13/15: 1570  Serum kappa, lambda light chains, and ratio  11/21/2014: 3.95, 2.08, 1.90  01/30/2015: 3.92, 2.64, 1.48 03/13/2015: 3.55, 2.17, 1.64  24 hr urine PEP 10/13/2014: (-)  RADIOGRAPHIC STUDIES: None  ASSESSMENT:  TATIANNA IBBOTSON 74 y.o. female with a history of Multiple Myeloma s/p autotransplant in August 2014 now on maintenence low dose Revlimid therapy  PLAN:   1. IgG kappa Multiple myeloma s/p Auto SCT, intermediate risk.  --MM markers demonstrate a very good response (M protein baseline 2.6, down to 0.28 after induction chemo and transplant, stable around 0.5 since July 2015,  Most recent 0.6 on 03/13/2015 -Her quantitative IgG and kappa light chain level has slightly increased, will continue monitoring  - Acyclovir has been stopped after 1 year of SCT.   -Revlimid was held in April due to her pneumonia, and restarted it again, reduced to 2.5 mg 2 weeks on, one-week off -I reviewed her lab results with her, mild leukopenia, adequate for treatment, she will restart next cycle of Revlimid next Monday  -Her disease has been very stable overall. She will repeat CBC before each cycle of Revlimid, CMP and MM lab every 2 cycles    2. Pancytopenia due to chemotherapy. -overall mild, no recent recurrent infection  -Continue monitoring  3. Osteoporosis -recently repeated bone density scan showed worsening osteoporosis, continue zometa, changed to every 3 months after 03/2015.  -she declined repeating DEXA   4. CAD, inferior STEMI in 02/2014 -continue plavix, metoprolol and lipitor  -follow up with Dr. Johnsie Cancel    Plan -Start Revlimid next Monday  -I will see her back in 6 weeks, repeat CBC in 3 weeks  -flu shot today   All questions were answered. The patient knows to call the clinic with any problems, questions or concerns. We can certainly see the patient much sooner if necessary.  I spent 20 minutes counseling the patient face to face. The total time spent in the appointment was 25 minutes.   [name]  04/20/2015

## 2015-04-20 NOTE — Telephone Encounter (Signed)
lvm for pt regarding to OCT and NOV appt....mailed pta ppt sched/avs and letter

## 2015-04-21 ENCOUNTER — Telehealth: Payer: Self-pay | Admitting: Hematology

## 2015-04-21 NOTE — Telephone Encounter (Signed)
returned call and s.w. pt and advised on OCT appt....pt ok and aware

## 2015-04-24 LAB — SPEP & IFE WITH QIG
ABNORMAL PROTEIN BAND1: 0.5 g/dL
ALPHA-2-GLOBULIN: 0.6 g/dL (ref 0.5–0.9)
Albumin ELP: 3.9 g/dL (ref 3.8–4.8)
Alpha-1-Globulin: 0.3 g/dL (ref 0.2–0.3)
BETA GLOBULIN: 0.4 g/dL (ref 0.4–0.6)
Beta 2: 0.3 g/dL (ref 0.2–0.5)
Gamma Globulin: 1.4 g/dL (ref 0.8–1.7)
IGG (IMMUNOGLOBIN G), SERUM: 1550 mg/dL (ref 690–1700)
IgA: 116 mg/dL (ref 69–380)
IgM, Serum: 33 mg/dL — ABNORMAL LOW (ref 52–322)
TOTAL PROTEIN, SERUM ELECTROPHOR: 6.8 g/dL (ref 6.1–8.1)

## 2015-04-24 LAB — KAPPA/LAMBDA LIGHT CHAINS
Kappa free light chain: 4.52 mg/dL — ABNORMAL HIGH (ref 0.33–1.94)
Kappa:Lambda Ratio: 2.54 — ABNORMAL HIGH (ref 0.26–1.65)
Lambda Free Lght Chn: 1.78 mg/dL (ref 0.57–2.63)

## 2015-04-27 ENCOUNTER — Telehealth: Payer: Self-pay | Admitting: Hematology

## 2015-04-27 NOTE — Telephone Encounter (Signed)
pt cld to get appt time for 10/27-gave pt appt tkime and date for 11/17 appt as well.

## 2015-05-04 DIAGNOSIS — L820 Inflamed seborrheic keratosis: Secondary | ICD-10-CM | POA: Diagnosis not present

## 2015-05-04 DIAGNOSIS — L570 Actinic keratosis: Secondary | ICD-10-CM | POA: Diagnosis not present

## 2015-05-11 ENCOUNTER — Other Ambulatory Visit: Payer: Self-pay

## 2015-05-11 ENCOUNTER — Other Ambulatory Visit: Payer: Self-pay | Admitting: *Deleted

## 2015-05-11 ENCOUNTER — Other Ambulatory Visit (HOSPITAL_BASED_OUTPATIENT_CLINIC_OR_DEPARTMENT_OTHER): Payer: Medicare Other

## 2015-05-11 ENCOUNTER — Ambulatory Visit: Payer: Self-pay | Admitting: Hematology

## 2015-05-11 DIAGNOSIS — C9000 Multiple myeloma not having achieved remission: Secondary | ICD-10-CM

## 2015-05-11 LAB — COMPREHENSIVE METABOLIC PANEL (CC13)
ALT: 16 U/L (ref 0–55)
ANION GAP: 7 meq/L (ref 3–11)
AST: 21 U/L (ref 5–34)
Albumin: 3.7 g/dL (ref 3.5–5.0)
Alkaline Phosphatase: 53 U/L (ref 40–150)
BILIRUBIN TOTAL: 0.76 mg/dL (ref 0.20–1.20)
BUN: 16.5 mg/dL (ref 7.0–26.0)
CALCIUM: 9.3 mg/dL (ref 8.4–10.4)
CHLORIDE: 107 meq/L (ref 98–109)
CO2: 25 mEq/L (ref 22–29)
CREATININE: 0.8 mg/dL (ref 0.6–1.1)
EGFR: 70 mL/min/{1.73_m2} — AB (ref >90)
Glucose: 97 mg/dl (ref 70–140)
Potassium: 3.6 mEq/L (ref 3.5–5.1)
Sodium: 140 mEq/L (ref 136–145)
TOTAL PROTEIN: 6.8 g/dL (ref 6.4–8.3)

## 2015-05-11 LAB — CBC WITH DIFFERENTIAL/PLATELET
BASO%: 2.2 % — AB (ref 0.0–2.0)
Basophils Absolute: 0.1 10*3/uL (ref 0.0–0.1)
EOS ABS: 0.1 10*3/uL (ref 0.0–0.5)
EOS%: 4.4 % (ref 0.0–7.0)
HEMATOCRIT: 33.2 % — AB (ref 34.8–46.6)
HGB: 11.1 g/dL — ABNORMAL LOW (ref 11.6–15.9)
LYMPH#: 0.7 10*3/uL — AB (ref 0.9–3.3)
LYMPH%: 25.6 % (ref 14.0–49.7)
MCH: 35.1 pg — ABNORMAL HIGH (ref 25.1–34.0)
MCHC: 33.4 g/dL (ref 31.5–36.0)
MCV: 105.1 fL — AB (ref 79.5–101.0)
MONO#: 0.3 10*3/uL (ref 0.1–0.9)
MONO%: 12.2 % (ref 0.0–14.0)
NEUT#: 1.5 10*3/uL (ref 1.5–6.5)
NEUT%: 55.6 % (ref 38.4–76.8)
PLATELETS: 145 10*3/uL (ref 145–400)
RBC: 3.16 10*6/uL — ABNORMAL LOW (ref 3.70–5.45)
RDW: 14.8 % — ABNORMAL HIGH (ref 11.2–14.5)
WBC: 2.7 10*3/uL — ABNORMAL LOW (ref 3.9–10.3)

## 2015-05-11 MED ORDER — REVLIMID 2.5 MG PO CAPS: 2.5000 mg | ORAL_CAPSULE | Freq: Every day | ORAL | Status: DC

## 2015-05-12 ENCOUNTER — Telehealth: Payer: Self-pay | Admitting: *Deleted

## 2015-05-12 NOTE — Telephone Encounter (Signed)
-----   Message from [name], MD sent at 05/11/2015  5:40 PM EDT ----- Please let pt know what her lab today are good, and she can start next cycle of Revlimid, thanks  [name]  05/11/2015

## 2015-05-12 NOTE — Telephone Encounter (Signed)
Message left on vm with OK to start next cycle of revlimid per Dr Ernestina Penna instructions.

## 2015-06-01 ENCOUNTER — Other Ambulatory Visit: Payer: Self-pay | Admitting: *Deleted

## 2015-06-01 ENCOUNTER — Telehealth: Payer: Self-pay | Admitting: *Deleted

## 2015-06-01 ENCOUNTER — Encounter: Payer: Self-pay | Admitting: Hematology

## 2015-06-01 ENCOUNTER — Other Ambulatory Visit: Payer: Self-pay

## 2015-06-01 ENCOUNTER — Telehealth: Payer: Self-pay | Admitting: Hematology

## 2015-06-01 ENCOUNTER — Other Ambulatory Visit (HOSPITAL_BASED_OUTPATIENT_CLINIC_OR_DEPARTMENT_OTHER): Payer: Medicare Other

## 2015-06-01 ENCOUNTER — Ambulatory Visit (HOSPITAL_BASED_OUTPATIENT_CLINIC_OR_DEPARTMENT_OTHER): Payer: Medicare Other | Admitting: Hematology

## 2015-06-01 VITALS — BP 118/66 | HR 78 | Temp 97.8°F | Resp 18 | Ht 67.0 in | Wt 126.4 lb

## 2015-06-01 DIAGNOSIS — C9000 Multiple myeloma not having achieved remission: Secondary | ICD-10-CM

## 2015-06-01 LAB — CBC WITH DIFFERENTIAL/PLATELET
BASO%: 1.3 % (ref 0.0–2.0)
BASOS ABS: 0 10*3/uL (ref 0.0–0.1)
EOS%: 3.3 % (ref 0.0–7.0)
Eosinophils Absolute: 0.1 10*3/uL (ref 0.0–0.5)
HEMATOCRIT: 33.9 % — AB (ref 34.8–46.6)
HEMOGLOBIN: 11.2 g/dL — AB (ref 11.6–15.9)
LYMPH#: 0.7 10*3/uL — AB (ref 0.9–3.3)
LYMPH%: 21.2 % (ref 14.0–49.7)
MCH: 34.8 pg — AB (ref 25.1–34.0)
MCHC: 33.1 g/dL (ref 31.5–36.0)
MCV: 105 fL — ABNORMAL HIGH (ref 79.5–101.0)
MONO#: 0.4 10*3/uL (ref 0.1–0.9)
MONO%: 11.7 % (ref 0.0–14.0)
NEUT#: 1.9 10*3/uL (ref 1.5–6.5)
NEUT%: 62.5 % (ref 38.4–76.8)
PLATELETS: 171 10*3/uL (ref 145–400)
RBC: 3.22 10*6/uL — ABNORMAL LOW (ref 3.70–5.45)
RDW: 14.9 % — ABNORMAL HIGH (ref 11.2–14.5)
WBC: 3.1 10*3/uL — ABNORMAL LOW (ref 3.9–10.3)

## 2015-06-01 MED ORDER — CLOPIDOGREL BISULFATE 75 MG PO TABS: 75.0000 mg | ORAL_TABLET | Freq: Every day | ORAL | Status: DC

## 2015-06-01 MED ORDER — REVLIMID 2.5 MG PO CAPS: 2.5000 mg | ORAL_CAPSULE | Freq: Every day | ORAL | Status: DC

## 2015-06-01 MED ORDER — METOPROLOL SUCCINATE ER 25 MG PO TB24: 25.0000 mg | ORAL_TABLET | Freq: Every day | ORAL | Status: DC

## 2015-06-01 NOTE — Telephone Encounter (Signed)
per pof to sch pt appt-sent MW emailt os ch pt trmt-will mail pt copy of avs after reply

## 2015-06-01 NOTE — Telephone Encounter (Signed)
Per staff message and POF I have scheduled appts. Advised scheduler of appts. JMW  

## 2015-06-01 NOTE — Progress Notes (Signed)
[place], MD Shannon Rush [place] [address]  DIAGNOSIS: Multiple myeloma not having achieved remission ([place])  CHIEF COMPLAINS: Follow-up of multiple myeloma  CURRENT TREATMENT:  Zometa 3.5 mg monthly started on 06/2013;  Revlimid 5 mg daily for 21 days then off for 7 days for maintenance for at least 2 years started on 09/02/2013.  Decreased to Revlimid 2.5 mg daily for 21 days then off for 7 days on 12/30/2013, and changed to 2.20m daily for 14 days then off for 7 days on 12/15/2014 due to multiple infection.      Multiple myeloma ([place]   05/26/2012 Initial Diagnosis Presenting IgG was 4,040 mg/dL on 06/05/2012 (IgA 35; IgM 34); kappa free light chain 34.4 mg/dL, lambda 0.00, kappa:lambda ratio of 34.75; SPEP with M-spike of 2.60.   06/30/2012 Imaging Numerous lytic lesions throughout the calvarium.  Lytic lesion within the left lateral scapula.  Findings most compatible with metastases or myeloma. Multiple mid and lower thoracic compression fractures.  Slight compression through the endplates at L3.   169/48/5462Bone Marrow Biopsy Bone marrow biopsy showed 49% plasma cell. Normal classical cytogenetics; however, myeloma FISH panel showed 13q- (intermediate risk)       07/14/2012 - 07/27/2012 Radiation Therapy Palliative radiation 20 Gy over 10 fractions between to thoracic spine cord compression and symptomatic left scapula lytic lesion.   08/10/2012 -  Chemotherapy Started SQ Velcade once weekly, 3 weeks on, 1 weeks off; daily Revlimid d1-21, 7 days off; and Dexamethasone 44mPO weekly.    02/12/2013 [facility]   06/14/2013 Tumor Marker IgG  1830,  Kappa:lamba ratio, 1.69 (baseline was 34.75 or   M-spike 0.28 (baseline of 2.6 or  89.3% of baseline).     06/25/2013 -  Chemotherapy Started zometa 3.5 mg monthly    09/01/2013 -  Chemotherapy Maintenance therapy with  revlimid 81m59maily for 14 days and then off for 7 (decreased from 21 days on and 7 days off based on neutropenia).    09/13/2013 - 09/17/2014 Hospital Admission Hospital admission for pneumonia   12/20/2013 Treatment Plan Change Maintenance therapy decreased to 2.5 mg daily for 21 days and then off for 7 days based on low counts.    01/25/2014 Tumor Marker IgG 1360 M spike 0.5   03/01/2014 - 03/05/2014 [facility], received thrombolytic therapy, cath showed 60% prox RCA, mid-distal RCA 50%, Echo normal, no stents.   04/11/2014 - 08/07/2014 Chemotherapy Continue Revlimid at 2.5 mg 3 weeks on 1 week off   08/08/2014 - 08/13/2014 Hospital Admission Hospital admission for pneumonia. Her Revlimid was held.   08/29/2014 - 09/14/2014 Chemotherapy Maintenance Revlimid restarted   09/14/2014 - 09/17/2014 Hospital Admission Admitted for pneumonia [place]   11/13/2014 -  Chemotherapy Maintenance Revlimid restarted, changed to 2.81mg110mily, 2 weks on, 1 week off from 12/15/2014    INTERVAL HISTORY:  Shannon IGOEy47. female with a past medical history of MM as detailed above, s/p SCT is here for follow-up.   She is doing well overall. She has mild stable joint and body ache, does not need any pain medication. She remains to be very physically active, bikes and exercise every day, her appetite and energy level are moderate and stable. She denies any fever, chill,  cough, diarrhea, or other new symptoms. She is off Revlimid this week, and supposed to start next cycle on coming Monday, November 21. She is going to [place] with her family members this weekend, and will come back on 11/27.   MEDICAL HISTORY: Past Medical History  Diagnosis Date  . Multiple myeloma ([place]) 07/01/2012  . Unspecified deficiency anemia   . OSTEOPOROSIS 06/11/2010    Multiple compression fractures; and spontaneous  fracture of sternum Qualifier: Diagnosis of  By: [name], [name]     . 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 Cord compression ([place]) 07/13/12    MRI- diffuse myeloma involvement of T-L spine  . History of radiation therapy 07/13/12-07/27/12    spinal cord compression T3-T10,left scapula  . CAD (coronary artery disease)     a. inf STEMI (in [place] >>> [facility]ytics) >>> LHC (8/15- [place]):  pRCA 60%, mid to dist RCA 50% >>> med rx  . Hx of echocardiogram     Echo (03/02/14-[facility] at [facility]):  Normal LVEF without significant regional wall motion abnormalities. Mild      ALLERGIES:  is allergic to zosyn; zithromax; and ciprofloxacin.  MEDICATIONS: has a current medication list which includes the following prescription(s): acetaminophen, atorvastatin, calcium carbonate, clopidogrel, metoprolol succinate, morphine, nitroglycerin, proair hfa, revlimid, and vitamin d (cholecalciferol).  SURGICAL HISTORY:  Past Surgical History  Procedure Laterality Date  . Appendectomy    . Tubal ligation    . Cesarean section      x2   . Hip surgery  2009    left  . Elbow surgery    . Colonoscopy  2007    neg with Dr. [name]    REVIEW OF SYSTEMS:   Constitutional: Denies abnormal weight loss, (+) fatigue Eyes: Denies blurriness of vision Ears, nose, mouth, throat, and face: Denies mucositis or sore throat, (+) left tooth pain  Respiratory: Denies cough, dyspnea or wheezes Cardiovascular: Denies palpitation, chest discomfort or lower extremity swelling Gastrointestinal:  Denies nausea, heartburn or change in bowel habits Skin: Denies abnormal skin rashes Lymphatics: Denies new lymphadenopathy or easy bruising Neurological:Denies numbness, tingling or new weaknesses Behavioral/Psych: Mood is stable, no new changes  All other systems were reviewed with the patient and are negative.  PHYSICAL EXAMINATION: ECOG PERFORMANCE STATUS: 0  Blood pressure  118/66, pulse 78, temperature 97.8 F (36.6 C), temperature source Oral, resp. rate 18, height _0  (1.702 m), weight 126 lb 6.4 oz (57.335 kg), SpO2 99 %.  GENERAL:alert, no distress and comfortable; easily mobile to exam table; kyphosis, moderate.  SKIN: skin color, texture, turgor are normal, no rashes or significant lesions; Abrasion well healed on RLE.  EYES: normal, Conjunctiva are pink, 1 tear/injectionof blood vessel left eye in sclera. OROPHARYNX:no exudate, no erythema and lips, buccal mucosa, and tongue normal, no ONJ NECK: supple, thyroid normal size, non-tender, without nodularity; nodules cervical bilaterally.  LYMPH:  no palpable lymphadenopathy in the cervical, axillary or supraclavicular LUNGS: crackles at the bases bilaterally with normal breathing effort, no wheezes or rhonchi HEART: regular rate & rhythm and no murmurs and no lower extremity edema ABDOMEN:abdomen soft, non-tender and normal bowel sounds Musculoskeletal:no cyanosis of digits and no clubbing, (+) spine scoliosis and mild tenderness  NEURO: alert & oriented x 3 with fluent speech, no focal motor/sensory deficits  Labs:  CBC Latest Ref Rng 06/01/2015 05/11/2015 04/20/2015  WBC 3.9 - 10.3 10e3/uL 3.1(L) 2.7(L) 2.7(L)  Hemoglobin 11.6 -  15.9 g/dL 11.2(L) 11.1(L) 11.8  Hematocrit 34.8 - 46.6 % 33.9(L) 33.2(L) 34.7(L)  Platelets 145 - 400 10e3/uL 171 145 163    CMP Latest Ref Rng 05/11/2015 04/20/2015 03/27/2015  Glucose 70 - 140 mg/dl 97 86 80  BUN 7.0 - 26.0 mg/dL 16.5 17.2 15.6  Creatinine 0.6 - 1.1 mg/dL 0.8 0.8 0.8  Sodium 136 - 145 mEq/L 140 141 141  Potassium 3.5 - 5.1 mEq/L 3.6 3.8 3.9  Chloride 96 - 112 mEq/L - - -  CO2 22 - 29 mEq/L _0 Calcium 8.4 - 10.4 mg/dL 9.3 9.6 9.4  Total Protein 6.4 - 8.3 g/dL 6.8 7.4 7.3  Total Bilirubin 0.20 - 1.20 mg/dL 0.76 0.76 0.63  Alkaline Phos 40 - 150 U/L 53 52 52  AST 5 - 34 U/L _1 ALT 0 - 55 U/L _2 SPEP M-PROTEIN 10/13/2014:  0.6 11/21/2014: 0.5 01/30/2015: 0.6 03/13/2015: 0.6 04/20/2015: 0.5  IgG [phone] 10/13/2014: 1710 11/21/2014: 1460 01/30/2015: 1380 03/13/15: 1570 04/20/2015: 1550  Serum kappa, lambda light chains, and ratio  11/21/2014: 3.95, 2.08, 1.90  01/30/2015: 3.92, 2.64, 1.48 03/13/2015: 3.55, 2.17, 1.64 04/20/15: 4.52, 1.78, 2.54  24 hr urine PEP 10/13/2014: (-)  RADIOGRAPHIC STUDIES: None  ASSESSMENT: TZIPPY TESTERMAN 74 y.o. female with a history of Multiple Myeloma s/p autotransplant in August 2014 now on maintenence low dose Revlimid therapy  PLAN:   1. IgG kappa Multiple myeloma s/p Auto SCT, intermediate risk, not in remission  --MM markers demonstrate a very good response (M protein baseline 2.6, down to 0.28 after induction chemo and transplant, stable around 0.5 since July 2015,  Most recent 0.6 on 03/13/2015 -Her quantitative IgG and kappa light chain level has slightly increased, will continue monitoring  - Acyclovir has been stopped after 1 year of SCT.   -Revlimid was held in April due to her pneumonia, and restarted it again, reduced to 2.5 mg 2 weeks on, one-week off -I reviewed her lab results with her, mild leukopenia, adequate for treatment. Due to her trip to [place], I'll postpone her next cycle for 1 week, she will restart next cycle of Revlimid on 06/12/2015  -we discussed neutropenic precaution during the trip, and also thrombosis prevention during the flight. She is going to wear compression stocks. -Her disease has been very stable overall. She will repeat CBC before each cycle of Revlimid, CMP and MM lab every 2 cycles    2. Pancytopenia due to chemotherapy. -overall mild, no recent recurrent infection  -Continue monitoring  3. Osteoporosis -recently repeated bone density scan showed worsening osteoporosis, continue zometa, changed to every 3 months after 03/2015.  -she declined repeating DEXA  -continue Zometa every 3 months  4. CAD, inferior STEMI in  02/2014 -continue plavix, metoprolol and lipitor  -follow up with Dr. Johnsie Cancel    Plan -postpone next cycle of Revlimid for 1 week due to her trip to [place]. Start Revlimid on 06/12/15 -I will see her back in 7 weeks, repeat CBC in 4 weeks  -Zometa infusion in early January  All questions were answered. The patient knows to call the clinic with any problems, questions or concerns. We can certainly see the patient much sooner if necessary.  I spent 20 minutes counseling the patient face to face. The total time spent in the appointment was 25 minutes.   [name]  06/01/2015

## 2015-06-02 ENCOUNTER — Other Ambulatory Visit: Payer: Self-pay | Admitting: *Deleted

## 2015-06-02 MED ORDER — ATORVASTATIN CALCIUM 40 MG PO TABS: 40.0000 mg | ORAL_TABLET | Freq: Every day | ORAL | Status: DC

## 2015-06-06 LAB — SPEP & IFE WITH QIG
Abnormal Protein Band1: 0.5 g/dL
Albumin ELP: 4.1 g/dL (ref 3.8–4.8)
Alpha-1-Globulin: 0.3 g/dL (ref 0.2–0.3)
Alpha-2-Globulin: 0.6 g/dL (ref 0.5–0.9)
BETA GLOBULIN: 0.4 g/dL (ref 0.4–0.6)
Beta 2: 0.3 g/dL (ref 0.2–0.5)
GAMMA GLOBULIN: 1.4 g/dL (ref 0.8–1.7)
IGA: 107 mg/dL (ref 69–380)
IgG (Immunoglobin G), Serum: 1590 mg/dL (ref 690–1700)
IgM, Serum: 34 mg/dL — ABNORMAL LOW (ref 52–322)
TOTAL PROTEIN, SERUM ELECTROPHOR: 7 g/dL (ref 6.1–8.1)

## 2015-06-06 LAB — KAPPA/LAMBDA LIGHT CHAINS
KAPPA FREE LGHT CHN: 4.82 mg/dL — AB (ref 0.33–1.94)
Kappa:Lambda Ratio: 2.75 — ABNORMAL HIGH (ref 0.26–1.65)
Lambda Free Lght Chn: 1.75 mg/dL (ref 0.57–2.63)

## 2015-06-15 DIAGNOSIS — N9089 Other specified noninflammatory disorders of vulva and perineum: Secondary | ICD-10-CM | POA: Diagnosis not present

## 2015-06-15 DIAGNOSIS — Z01419 Encounter for gynecological examination (general) (routine) without abnormal findings: Secondary | ICD-10-CM | POA: Diagnosis not present

## 2015-06-15 DIAGNOSIS — Z124 Encounter for screening for malignant neoplasm of cervix: Secondary | ICD-10-CM | POA: Diagnosis not present

## 2015-06-20 DIAGNOSIS — N9089 Other specified noninflammatory disorders of vulva and perineum: Secondary | ICD-10-CM | POA: Diagnosis not present

## 2015-06-21 DIAGNOSIS — N763 Subacute and chronic vulvitis: Secondary | ICD-10-CM | POA: Diagnosis not present

## 2015-06-29 ENCOUNTER — Other Ambulatory Visit (HOSPITAL_BASED_OUTPATIENT_CLINIC_OR_DEPARTMENT_OTHER): Payer: Medicare Other

## 2015-06-29 DIAGNOSIS — C9000 Multiple myeloma not having achieved remission: Secondary | ICD-10-CM

## 2015-06-29 LAB — CBC WITH DIFFERENTIAL/PLATELET
BASO%: 0.7 % (ref 0.0–2.0)
Basophils Absolute: 0 10*3/uL (ref 0.0–0.1)
EOS ABS: 0.2 10*3/uL (ref 0.0–0.5)
EOS%: 3.5 % (ref 0.0–7.0)
HCT: 32.9 % — ABNORMAL LOW (ref 34.8–46.6)
HGB: 10.9 g/dL — ABNORMAL LOW (ref 11.6–15.9)
LYMPH%: 14.7 % (ref 14.0–49.7)
MCH: 34.9 pg — ABNORMAL HIGH (ref 25.1–34.0)
MCHC: 33.1 g/dL (ref 31.5–36.0)
MCV: 105.4 fL — AB (ref 79.5–101.0)
MONO#: 0.6 10*3/uL (ref 0.1–0.9)
MONO%: 13.5 % (ref 0.0–14.0)
NEUT%: 67.6 % (ref 38.4–76.8)
NEUTROS ABS: 2.9 10*3/uL (ref 1.5–6.5)
Platelets: 128 10*3/uL — ABNORMAL LOW (ref 145–400)
RBC: 3.12 10*6/uL — AB (ref 3.70–5.45)
RDW: 14.8 % — ABNORMAL HIGH (ref 11.2–14.5)
WBC: 4.3 10*3/uL (ref 3.9–10.3)
lymph#: 0.6 10*3/uL — ABNORMAL LOW (ref 0.9–3.3)

## 2015-06-29 LAB — COMPREHENSIVE METABOLIC PANEL
ALT: 28 U/L (ref 0–55)
ANION GAP: 6 meq/L (ref 3–11)
AST: 27 U/L (ref 5–34)
Albumin: 3.6 g/dL (ref 3.5–5.0)
Alkaline Phosphatase: 68 U/L (ref 40–150)
BUN: 15 mg/dL (ref 7.0–26.0)
CHLORIDE: 106 meq/L (ref 98–109)
CO2: 26 meq/L (ref 22–29)
Calcium: 9.2 mg/dL (ref 8.4–10.4)
Creatinine: 0.9 mg/dL (ref 0.6–1.1)
EGFR: 68 mL/min/{1.73_m2} — AB (ref >90)
GLUCOSE: 90 mg/dL (ref 70–140)
POTASSIUM: 3.8 meq/L (ref 3.5–5.1)
SODIUM: 138 meq/L (ref 136–145)
TOTAL PROTEIN: 7.1 g/dL (ref 6.4–8.3)
Total Bilirubin: 0.72 mg/dL (ref 0.20–1.20)

## 2015-07-03 ENCOUNTER — Other Ambulatory Visit: Payer: Self-pay | Admitting: *Deleted

## 2015-07-03 ENCOUNTER — Telehealth: Payer: Self-pay | Admitting: Hematology

## 2015-07-03 ENCOUNTER — Other Ambulatory Visit: Payer: Self-pay | Admitting: Hematology

## 2015-07-03 ENCOUNTER — Telehealth: Payer: Self-pay | Admitting: *Deleted

## 2015-07-03 MED ORDER — REVLIMID 2.5 MG PO CAPS: 2.5000 mg | ORAL_CAPSULE | Freq: Every day | ORAL | Status: DC

## 2015-07-03 NOTE — Telephone Encounter (Signed)
Pt called to state she is waiting to hear about lab results from last week to obtain a refill on revlimid.  Wilfred Curtis states she meant to call Friday but was unable to.  She is due to start the revlimid today per lab review but does not have prescription per mail order pharmacy.  This RN informed pt above would be given to MD and nurse at desk for review and return call as well as refill of Revlimid.  Return call given as [phone].

## 2015-07-03 NOTE — Telephone Encounter (Signed)
I called pt back and discussed her lab result, adequate for treatment, will refill her Revlimid.today, and she will start the next cycle as soon as she receives it.  Shannon Rush 07/03/2015

## 2015-07-03 NOTE — Telephone Encounter (Signed)
No entry 

## 2015-07-11 DIAGNOSIS — J019 Acute sinusitis, unspecified: Secondary | ICD-10-CM | POA: Diagnosis not present

## 2015-07-20 ENCOUNTER — Encounter: Payer: Self-pay | Admitting: Hematology

## 2015-07-20 ENCOUNTER — Telehealth: Payer: Self-pay | Admitting: Hematology

## 2015-07-20 ENCOUNTER — Ambulatory Visit (HOSPITAL_BASED_OUTPATIENT_CLINIC_OR_DEPARTMENT_OTHER): Payer: Medicare Other | Admitting: Hematology

## 2015-07-20 ENCOUNTER — Other Ambulatory Visit (HOSPITAL_BASED_OUTPATIENT_CLINIC_OR_DEPARTMENT_OTHER): Payer: Medicare Other

## 2015-07-20 ENCOUNTER — Ambulatory Visit (HOSPITAL_BASED_OUTPATIENT_CLINIC_OR_DEPARTMENT_OTHER): Payer: Medicare Other

## 2015-07-20 VITALS — BP 141/79 | HR 75 | Temp 97.6°F | Resp 18 | Ht 67.0 in | Wt 129.8 lb

## 2015-07-20 DIAGNOSIS — D61810 Antineoplastic chemotherapy induced pancytopenia: Secondary | ICD-10-CM

## 2015-07-20 DIAGNOSIS — I2510 Atherosclerotic heart disease of native coronary artery without angina pectoris: Secondary | ICD-10-CM | POA: Diagnosis not present

## 2015-07-20 DIAGNOSIS — M810 Age-related osteoporosis without current pathological fracture: Secondary | ICD-10-CM | POA: Diagnosis not present

## 2015-07-20 DIAGNOSIS — C9000 Multiple myeloma not having achieved remission: Secondary | ICD-10-CM

## 2015-07-20 DIAGNOSIS — J329 Chronic sinusitis, unspecified: Secondary | ICD-10-CM

## 2015-07-20 LAB — CBC WITH DIFFERENTIAL/PLATELET
BASO%: 0.9 % (ref 0.0–2.0)
Basophils Absolute: 0 10*3/uL (ref 0.0–0.1)
EOS%: 4 % (ref 0.0–7.0)
Eosinophils Absolute: 0.1 10*3/uL (ref 0.0–0.5)
HEMATOCRIT: 35.8 % (ref 34.8–46.6)
HGB: 11.7 g/dL (ref 11.6–15.9)
LYMPH%: 21.3 % (ref 14.0–49.7)
MCH: 34.4 pg — AB (ref 25.1–34.0)
MCHC: 32.7 g/dL (ref 31.5–36.0)
MCV: 105.3 fL — ABNORMAL HIGH (ref 79.5–101.0)
MONO#: 0.4 10*3/uL (ref 0.1–0.9)
MONO%: 12.5 % (ref 0.0–14.0)
NEUT#: 2 10*3/uL (ref 1.5–6.5)
NEUT%: 61.3 % (ref 38.4–76.8)
PLATELETS: 185 10*3/uL (ref 145–400)
RBC: 3.4 10*6/uL — ABNORMAL LOW (ref 3.70–5.45)
RDW: 15.3 % — ABNORMAL HIGH (ref 11.2–14.5)
WBC: 3.3 10*3/uL — AB (ref 3.9–10.3)
lymph#: 0.7 10*3/uL — ABNORMAL LOW (ref 0.9–3.3)

## 2015-07-20 MED ORDER — SODIUM CHLORIDE 0.9 % IV SOLN
Freq: Once | INTRAVENOUS | Status: AC
  Administered 2015-07-20: 11:00:00 via INTRAVENOUS

## 2015-07-20 MED ORDER — ZOLEDRONIC ACID 4 MG/5ML IV CONC
3.5000 mg | Freq: Once | INTRAVENOUS | Status: AC
  Administered 2015-07-20: 3.5 mg via INTRAVENOUS
  Filled 2015-07-20: qty 4.38

## 2015-07-20 NOTE — Patient Instructions (Signed)

## 2015-07-20 NOTE — Progress Notes (Signed)
[place], [place] [address]  DIAGNOSIS: Multiple myeloma not having achieved remission ([place])  CHIEF COMPLAINS: Follow-up of multiple myeloma  CURRENT TREATMENT:  Zometa 3.5 mg monthly started on 06/2013;  Revlimid 5 mg daily for 21 days then off for 7 days for maintenance for at least 2 years started on 09/02/2013.  Decreased to Revlimid 2.5 mg daily for 21 days then off for 7 days on 12/30/2013, and changed to 2.74m daily for 14 days then off for 7 days on 12/15/2014 due to multiple infection.      [place]oma ([place]   05/26/2012 Initial Diagnosis Presenting IgG was 4,040 mg/dL on 06/05/2012 (IgA 35; IgM 34); kappa free light chain 34.4 mg/dL, lambda 0.00, kappa:lambda ratio of 34.75; SPEP with M-spike of 2.60.   06/30/2012 Imaging Numerous lytic lesions throughout the calvarium.  Lytic lesion within the left lateral scapula.  Findings most compatible with metastases or myeloma. Multiple mid and lower thoracic compression fractures.  Slight compression through the endplates at L3.   188/41/6606Bone Marrow Biopsy Bone marrow biopsy showed 49% plasma cell. Normal classical cytogenetics; however, myeloma FISH panel showed 13q- (intermediate risk)       07/14/2012 - 07/27/2012 Radiation Therapy Palliative radiation 20 Gy over 10 fractions between to thoracic spine cord compression and symptomatic left scapula lytic lesion.   08/10/2012 -  Chemotherapy Started SQ Velcade once weekly, 3 weeks on, 1 weeks off; daily Revlimid d1-21, 7 days off; and Dexamethasone 436mPO weekly.    02/12/2013 [facility] Mar[facility] Trans[facility]   06/14/2013 Tumor Marker IgG  1830,  Kappa:lamba ratio, 1.69 (baseline was 34.75 or   M-spike 0.28 (baseline of 2.6 or  89.3% of baseline).     06/25/2013 -  Chemotherapy Started zometa 3.5 mg monthly    09/01/2013 -  Chemotherapy Maintenance therapy with  revlimid 81m59maily for 14 days and then off for 7 (decreased from 21 days on and 7 days off based on neutropenia).    09/13/2013 - 09/17/2014 Hospital Admission Hospital admission for pneumonia   12/20/2013 Treatment Plan Change Maintenance therapy decreased to 2.5 mg daily for 21 days and then off for 7 days based on low counts.    01/25/2014 Tumor Marker IgG 1360 M spike 0.5   03/01/2014 - 03/05/2014 [facility], received thrombolytic therapy, cath showed 60% prox RCA, mid-distal RCA 50%, Echo normal, no stents.   04/11/2014 - 08/07/2014 Chemotherapy Continue Revlimid at 2.5 mg 3 weeks on 1 week off   08/08/2014 - 08/13/2014 Hospital Admission Hospital admission for pneumonia. Her Revlimid was held.   08/29/2014 - 09/14/2014 Chemotherapy Maintenance Revlimid restarted   09/14/2014 - 09/17/2014 Hospital Admission [place] for pneumonia [place]   11/13/2014 -  Chemotherapy Maintenance Revlimid restarted, changed to 2.81mg67mily, 2 weks on, 1 week off from 12/15/2014    INTERVAL HISTORY:  ClaiSHELL [id number]. female with a past medical history of MM as detailed above, s/p SCT is here for follow-up.   [place], no issues with fever or infection. She started having sinus infection about 2-3 weeks ago, no fever, mild dry cough, [facility] [place] [facility] [place], and was started on doxycycline for 10 days.  Her symptoms has improved since then, but has not completely resolved. She finished the last cycle Revlimid yesterday. She otherwise doing well, no other new complaints.  MEDICAL HISTORY: Past Medical History  Diagnosis Date  . Multiple myeloma ([place]) 07/01/2012  . Unspecified deficiency anemia   . OSTEOPOROSIS 06/11/2010    Multiple compression fractures; and spontaneous fracture of sternum Qualifier: Diagnosis of  By: [name], [name]     .  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 Cord compression ([place]) 07/13/12    MRI- diffuse myeloma involvement of T-L spine  . History of radiation therapy 07/13/12-07/27/12    spinal cord compression T3-T10,left scapula  . CAD (coronary artery disease)     a. inf STEMI (in [place] >>> [facility]) >>> LHC (8/15- [place]):  pRCA 60%, mid to dist RCA 50% >>> med rx  . Hx of echocardiogram     Echo (03/02/14-done at [facility]):  Normal LVEF without significant regional wall motion abnormalities. Mild      ALLERGIES:  is allergic to zosyn; piperacillin-tazobactam in dex; zithromax; and ciprofloxacin.  MEDICATIONS: has a current medication list which includes the following prescription(s): acetaminophen, atorvastatin, calcium carbonate, clopidogrel, doxycycline, metoprolol succinate, morphine, nitroglycerin, proair hfa, revlimid, and vitamin d (cholecalciferol), and the following Facility-Administered Medications: sodium chloride and zolendronic acid (ZOMETA) 3.5 mg in sodium chloride 0.9 % 100 mL IVPB.  SURGICAL HISTORY:  Past Surgical History  Procedure Laterality Date  . Appendectomy    . Tubal ligation    . Cesarean section      x2   . Hip surgery  2009    left  . Elbow surgery    . Colonoscopy  2007    neg with Dr. [name]    REVIEW OF SYSTEMS:   Constitutional: Denies abnormal weight loss, (+) fatigue Eyes: Denies blurriness of vision Ears, nose, mouth, throat, and face: Denies mucositis or sore throat, (+) left tooth pain  Respiratory: Denies cough, dyspnea or wheezes Cardiovascular: Denies palpitation, chest discomfort or lower extremity swelling Gastrointestinal:  Denies nausea, heartburn or change in bowel habits Skin: Denies abnormal skin rashes Lymphatics: Denies new lymphadenopathy or easy bruising Neurological:Denies numbness, tingling or new weaknesses Behavioral/Psych: Mood is stable, no new changes  All other systems  were reviewed with the patient and are negative.  PHYSICAL EXAMINATION: ECOG PERFORMANCE STATUS: 0  Blood pressure 141/79, pulse 75, temperature 97.6 F (36.4 C), temperature source Oral, resp. rate 18, height _0  (1.702 m), weight 129 lb 12.8 oz (58.877 kg), SpO2 99 %.  GENERAL:alert, no distress and comfortable; easily mobile to exam table; kyphosis, moderate.  SKIN: skin color, texture, turgor are normal, no rashes or significant lesions; Abrasion well healed on RLE.  EYES: normal, Conjunctiva are pink, 1 tear/injectionof blood vessel left eye in sclera. OROPHARYNX:no exudate, no erythema and lips, buccal mucosa, and tongue normal, no ONJ NECK: supple, thyroid normal size, non-tender, without nodularity; nodules cervical bilaterally.  LYMPH:  no palpable lymphadenopathy in the cervical, axillary or supraclavicular LUNGS: crackles at the bases bilaterally with normal breathing effort, no wheezes or rhonchi HEART: regular rate & rhythm and no murmurs and no lower extremity edema ABDOMEN:abdomen soft, non-tender and normal bowel sounds Musculoskeletal:no cyanosis of digits and no clubbing, (+) spine scoliosis and mild tenderness  NEURO: alert & oriented x 3 with fluent speech, no focal motor/sensory deficits  Labs:  CBC Latest Ref Rng 07/20/2015 06/29/2015 06/01/2015  WBC 3.9 -  10.3 10e3/uL 3.3(L) 4.3 3.1(L)  Hemoglobin 11.6 - 15.9 g/dL 11.7 10.9(L) 11.2(L)  Hematocrit 34.8 - 46.6 % 35.8 32.9(L) 33.9(L)  Platelets 145 - 400 10e3/uL 185 128(L) 171    CMP Latest Ref Rng 06/29/2015 05/11/2015 04/20/2015  Glucose 70 - 140 mg/dl 90 97 86  BUN 7.0 - 26.0 mg/dL 15.0 16.5 17.2  Creatinine 0.6 - 1.1 mg/dL 0.9 0.8 0.8  Sodium 136 - 145 mEq/L 138 140 141  Potassium 3.5 - 5.1 mEq/L 3.8 3.6 3.8  Chloride 96 - 112 mEq/L - - -  CO2 22 - 29 mEq/L _0 Calcium 8.4 - 10.4 mg/dL 9.2 9.3 9.6  Total Protein 6.4 - 8.3 g/dL 7.1 6.8 7.4  Total Bilirubin 0.20 - 1.20 mg/dL 0.72 0.76 0.76  Alkaline  Phos 40 - 150 U/L 68 53 52  AST 5 - 34 U/L _1 ALT 0 - 55 U/L _2 SPEP M-PROTEIN 10/13/2014: 0.6 11/21/2014: 0.5 01/30/2015: 0.6 03/13/2015: 0.6 04/20/2015: 0.5 06/01/2015: 0.5  IgG ([phone]) 10/13/2014: 1710 11/21/2014: 1460 01/30/2015: 1380 03/13/15: 1570 04/20/2015: 1550 06/01/2015: 1590   Serum kappa, lambda light chains, and ratio  11/21/2014: 3.95, 2.08, 1.90  01/30/2015: 3.92, 2.64, 1.48 03/13/2015: 3.55, 2.17, 1.64 04/20/15: 4.52, 1.78, 2.54 06/01/2015: 4.82, 1.75, 2.75  24 hr urine PEP 10/13/2014: (-)  RADIOGRAPHIC STUDIES: None  ASSESSMENT: Shannon Rush 75 y.o. female with a history of Multiple Myeloma s/p autotransplant in August 2014 now on maintenence low dose Revlimid therapy  PLAN:   1. IgG kappa Multiple myeloma s/p Auto SCT, intermediate risk, not in remission  --MM markers demonstrate a very good response (M protein baseline 2.6, down to 0.28 after induction chemo and transplant, stable around 0.5 since July 2015,  Most recent 0.6 on 03/13/2015 -Her quantitative IgG and kappa light chain level has slightly increased, will continue monitoring  - Acyclovir has been stopped after 1 year of SCT.   -Revlimid was held in April due to her pneumonia, and restarted it again, reduced to 2.5 mg 2 weeks on, one-week off -I reviewed her lab results with her, mild leukopenia, adequate for treatment. Due to her trip to [place], I'll postpone her next cycle for 1 week, she will restart next cycle of Revlimid on 06/12/2015  -we discussed neutropenic precaution during the trip, and also thrombosis prevention during the flight. She is going to wear compression stocks. -Her disease has been very stable overall. She will repeat CBC before each cycle of Revlimid, CMP and MM lab every 2 cycles    2. Pancytopenia due to chemotherapy. -overall mild, no recent recurrent infection except a cold and sinus infection lately   -Continue monitoring  3.  Osteoporosis -recently repeated bone density scan showed worsening osteoporosis, continue zometa, changed to every 3 months after 03/2015.  -she declined repeating DEXA  -continue Zometa every 3 months  4. CAD, inferior STEMI in 02/2014 -continue plavix, metoprolol and lipitor  -follow up with Dr. Johnsie Cancel   5. Sinus infection  - her symptoms and has significant for improved. She'll finish Doxycycline in 3 days. - we again reviewed the risk of infection and prevention strategy.    Plan -Start next cycle Revlimid on 1/12, 2 weeks on, one week off -repeat CBC on the week of 1/30. Due to her travel to [facility] on February 8, the subsequent cycle of Revlimid will be postponed to 2/13-2/27 -I will see her back on 3/1 -Zometa infusion in  early April   All questions were answered. The patient knows to call the clinic with any problems, questions or concerns. We can certainly see the patient much sooner if necessary.  I spent 20 minutes counseling the patient face to face. The total time spent in the appointment was 25 minutes.   [name]  07/20/2015

## 2015-07-20 NOTE — Telephone Encounter (Signed)
per pof to sch pt appt-gave pt copy of avs °

## 2015-07-24 LAB — SPEP & IFE WITH QIG
ABNORMAL PROTEIN BAND1: 0.7 g/dL
ALPHA-1-GLOBULIN: 0.4 g/dL — AB (ref 0.2–0.3)
ALPHA-2-GLOBULIN: 0.6 g/dL (ref 0.5–0.9)
Albumin ELP: 3.7 g/dL — ABNORMAL LOW (ref 3.8–4.8)
BETA GLOBULIN: 0.4 g/dL (ref 0.4–0.6)
Beta 2: 0.2 g/dL (ref 0.2–0.5)
GAMMA GLOBULIN: 1.6 g/dL (ref 0.8–1.7)
IgA: 130 mg/dL (ref 69–380)
IgG (Immunoglobin G), Serum: 1770 mg/dL — ABNORMAL HIGH (ref 690–1700)
IgM, Serum: 38 mg/dL — ABNORMAL LOW (ref 52–322)
Total Protein, Serum Electrophoresis: 7 g/dL (ref 6.1–8.1)

## 2015-07-24 LAB — KAPPA/LAMBDA LIGHT CHAINS
KAPPA FREE LGHT CHN: 8.71 mg/dL — AB (ref 0.33–1.94)
KAPPA LAMBDA RATIO: 3.58 — AB (ref 0.26–1.65)
Lambda Free Lght Chn: 2.43 mg/dL (ref 0.57–2.63)

## 2015-07-25 ENCOUNTER — Telehealth: Payer: Self-pay | Admitting: *Deleted

## 2015-07-25 NOTE — Telephone Encounter (Signed)
[facility] faxed Revlimid refill request to [facility]  Request received by Triage today and to provider's Triage medication request folder for review.

## 2015-07-26 ENCOUNTER — Other Ambulatory Visit: Payer: Self-pay | Admitting: *Deleted

## 2015-07-26 ENCOUNTER — Telehealth: Payer: Self-pay | Admitting: *Deleted

## 2015-07-26 MED ORDER — REVLIMID 2.5 MG PO CAPS: 2.5000 mg | ORAL_CAPSULE | Freq: Every day | ORAL | Status: DC

## 2015-07-26 NOTE — Telephone Encounter (Signed)
Received call from pt yest 07/25/15 asking about refill on her revlimid, stating that she has not heard from anyone.  Last refill on 06/26/15 & seen 07/20/15 & per Dr Ernestina Penna note to cont. Revlimid.  Will prepare script & send to her pharmacy & ask for script to be expedited. Pt notified.

## 2015-08-10 ENCOUNTER — Telehealth: Payer: Self-pay | Admitting: *Deleted

## 2015-08-10 NOTE — Telephone Encounter (Signed)
TC from patient requesting clarification of revlimid start and stop and scheduled lab appts.   Reviewed with pt what was in Dr. Ernestina Penna note from early January office visit.  Pt. Voiced understanding.

## 2015-08-14 ENCOUNTER — Other Ambulatory Visit: Payer: Self-pay | Admitting: Hematology

## 2015-08-14 DIAGNOSIS — C9000 Multiple myeloma not having achieved remission: Secondary | ICD-10-CM

## 2015-08-14 MED ORDER — REVLIMID 2.5 MG PO CAPS: 2.5000 mg | ORAL_CAPSULE | Freq: Every day | ORAL | Status: DC

## 2015-08-16 ENCOUNTER — Telehealth: Payer: Self-pay | Admitting: *Deleted

## 2015-08-16 ENCOUNTER — Other Ambulatory Visit: Payer: Self-pay | Admitting: *Deleted

## 2015-08-16 DIAGNOSIS — C9000 Multiple myeloma not having achieved remission: Secondary | ICD-10-CM

## 2015-08-16 MED ORDER — REVLIMID 2.5 MG PO CAPS: 2.5000 mg | ORAL_CAPSULE | Freq: Every day | ORAL | Status: DC

## 2015-08-16 NOTE — Telephone Encounter (Signed)
"  Joey with [facility] calling in reference to this mutual patient. We need a new authorization number to dispense Revlimid.  Please call authorization number to [phone] Mon. - Fri. 8:00 am till 8:00 pm EST."  Authorization number associated with this order was obtained 07-26-2015 and dispensed 07-27-2015.    This nurse obtained new authorization number [id number].  Called and entered new eRx order.

## 2015-08-17 ENCOUNTER — Other Ambulatory Visit (HOSPITAL_BASED_OUTPATIENT_CLINIC_OR_DEPARTMENT_OTHER): Payer: Medicare Other

## 2015-08-17 DIAGNOSIS — C9000 Multiple myeloma not having achieved remission: Secondary | ICD-10-CM | POA: Diagnosis not present

## 2015-08-17 LAB — COMPREHENSIVE METABOLIC PANEL
ALK PHOS: 56 U/L (ref 40–150)
ALT: 22 U/L (ref 0–55)
ANION GAP: 9 meq/L (ref 3–11)
AST: 27 U/L (ref 5–34)
Albumin: 3.9 g/dL (ref 3.5–5.0)
BUN: 17.6 mg/dL (ref 7.0–26.0)
CO2: 24 meq/L (ref 22–29)
Calcium: 9.2 mg/dL (ref 8.4–10.4)
Chloride: 107 mEq/L (ref 98–109)
Creatinine: 0.9 mg/dL (ref 0.6–1.1)
EGFR: 67 mL/min/{1.73_m2} — AB (ref >90)
GLUCOSE: 86 mg/dL (ref 70–140)
POTASSIUM: 3.6 meq/L (ref 3.5–5.1)
SODIUM: 140 meq/L (ref 136–145)
Total Bilirubin: 0.8 mg/dL (ref 0.20–1.20)
Total Protein: 7.9 g/dL (ref 6.4–8.3)

## 2015-08-17 LAB — CBC WITH DIFFERENTIAL/PLATELET
BASO%: 0.9 % (ref 0.0–2.0)
BASOS ABS: 0 10*3/uL (ref 0.0–0.1)
EOS ABS: 0.1 10*3/uL (ref 0.0–0.5)
EOS%: 3.5 % (ref 0.0–7.0)
HEMATOCRIT: 35.7 % (ref 34.8–46.6)
HEMOGLOBIN: 11.7 g/dL (ref 11.6–15.9)
LYMPH#: 1 10*3/uL (ref 0.9–3.3)
LYMPH%: 28 % (ref 14.0–49.7)
MCH: 34.2 pg — AB (ref 25.1–34.0)
MCHC: 32.8 g/dL (ref 31.5–36.0)
MCV: 104.4 fL — ABNORMAL HIGH (ref 79.5–101.0)
MONO#: 0.4 10*3/uL (ref 0.1–0.9)
MONO%: 11.5 % (ref 0.0–14.0)
NEUT#: 1.9 10*3/uL (ref 1.5–6.5)
NEUT%: 56.1 % (ref 38.4–76.8)
Platelets: 148 10*3/uL (ref 145–400)
RBC: 3.42 10*6/uL — ABNORMAL LOW (ref 3.70–5.45)
RDW: 15.1 % — ABNORMAL HIGH (ref 11.2–14.5)
WBC: 3.4 10*3/uL — ABNORMAL LOW (ref 3.9–10.3)
nRBC: 0 % (ref 0–0)

## 2015-08-23 ENCOUNTER — Other Ambulatory Visit: Payer: Self-pay | Admitting: Cardiovascular Disease

## 2015-08-23 MED ORDER — ATORVASTATIN CALCIUM 40 MG PO TABS: 40.0000 mg | ORAL_TABLET | Freq: Every day | ORAL | Status: DC

## 2015-09-11 ENCOUNTER — Other Ambulatory Visit: Payer: Self-pay | Admitting: *Deleted

## 2015-09-11 DIAGNOSIS — C9000 Multiple myeloma not having achieved remission: Secondary | ICD-10-CM

## 2015-09-11 MED ORDER — REVLIMID 2.5 MG PO CAPS: 2.5000 mg | ORAL_CAPSULE | Freq: Every day | ORAL | Status: DC

## 2015-09-13 ENCOUNTER — Telehealth: Payer: Self-pay | Admitting: Hematology

## 2015-09-13 NOTE — Telephone Encounter (Signed)
Due to YF out moved 3/2 f/u to LT. Left message for patient on both home/cell phone re change and new time for lab/LT 3/2 @ 2:15pm.

## 2015-09-14 ENCOUNTER — Other Ambulatory Visit (HOSPITAL_BASED_OUTPATIENT_CLINIC_OR_DEPARTMENT_OTHER): Payer: Medicare Other

## 2015-09-14 ENCOUNTER — Ambulatory Visit (HOSPITAL_BASED_OUTPATIENT_CLINIC_OR_DEPARTMENT_OTHER): Payer: Medicare Other | Admitting: Nurse Practitioner

## 2015-09-14 VITALS — BP 131/69 | HR 70 | Temp 97.6°F | Resp 17 | Ht 67.0 in | Wt 130.5 lb

## 2015-09-14 DIAGNOSIS — C9000 Multiple myeloma not having achieved remission: Secondary | ICD-10-CM | POA: Diagnosis not present

## 2015-09-14 DIAGNOSIS — M810 Age-related osteoporosis without current pathological fracture: Secondary | ICD-10-CM | POA: Diagnosis not present

## 2015-09-14 LAB — CBC WITH DIFFERENTIAL/PLATELET
BASO%: 0.9 % (ref 0.0–2.0)
Basophils Absolute: 0 10*3/uL (ref 0.0–0.1)
EOS ABS: 0.1 10*3/uL (ref 0.0–0.5)
EOS%: 2.4 % (ref 0.0–7.0)
HCT: 32.1 % — ABNORMAL LOW (ref 34.8–46.6)
HGB: 10.8 g/dL — ABNORMAL LOW (ref 11.6–15.9)
LYMPH%: 24.7 % (ref 14.0–49.7)
MCH: 35 pg — ABNORMAL HIGH (ref 25.1–34.0)
MCHC: 33.6 g/dL (ref 31.5–36.0)
MCV: 103.9 fL — AB (ref 79.5–101.0)
MONO#: 0.3 10*3/uL (ref 0.1–0.9)
MONO%: 8.2 % (ref 0.0–14.0)
NEUT%: 63.8 % (ref 38.4–76.8)
NEUTROS ABS: 2.1 10*3/uL (ref 1.5–6.5)
Platelets: 144 10*3/uL — ABNORMAL LOW (ref 145–400)
RBC: 3.09 10*6/uL — AB (ref 3.70–5.45)
RDW: 15.3 % — AB (ref 11.2–14.5)
WBC: 3.3 10*3/uL — AB (ref 3.9–10.3)
lymph#: 0.8 10*3/uL — ABNORMAL LOW (ref 0.9–3.3)

## 2015-09-14 NOTE — Progress Notes (Signed)
[facility] OFFICE PROGRESS NOTE   Diagnosis:  Multiple myeloma Multiple myeloma ([place])   05/26/2012 Initial Diagnosis Presenting IgG was 4,040 mg/dL on 06/05/2012 (IgA 35; IgM 34); kappa free light chain 34.4 mg/dL, lambda 0.00, kappa:lambda ratio of 34.75; SPEP with M-spike of 2.60.   06/30/2012 Imaging Numerous lytic lesions throughout the calvarium. Lytic lesion within the left lateral scapula. Findings most compatible with metastases or myeloma. Multiple mid and lower thoracic compression fractures. Slight compression through the endplates at L3.   47/03/2956 Bone Marrow Biopsy Bone marrow biopsy showed 49% plasma cell. Normal classical cytogenetics; however, myeloma FISH panel showed 13q- (intermediate risk)    07/14/2012 - 07/27/2012 Radiation Therapy Palliative radiation 20 Gy over 10 fractions between to thoracic spine cord compression and symptomatic left scapula lytic lesion.   08/10/2012 -  Chemotherapy Started SQ Velcade once weekly, 3 weeks on, 1 weeks off; daily Revlimid d1-21, 7 days off; and Dexamethasone 13m PO weekly.    02/12/2013 [facility]   06/14/2013 Tumor Marker IgG 1830, Kappa:lamba ratio, 1.69 (baseline was 34.75 or M-spike 0.28 (baseline of 2.6 or 89.3% of baseline).    06/25/2013 -  Chemotherapy Started zometa 3.5 mg monthly    09/01/2013 -  Chemotherapy Maintenance therapy with revlimid 577mdaily for 14 days and then off for 7 (decreased from 21 days on and 7 days off based on neutropenia).    09/13/2013 - 09/17/2014 Hospital Admission Hospital admission for pneumonia   12/20/2013 Treatment Plan Change Maintenance therapy decreased to 2.5 mg daily for 21 days and then off for 7 days based on low counts.    01/25/2014 Tumor Marker IgG 1360 M spike 0.5   03/01/2014 - 03/05/2014 [facility],  received thrombolytic therapy, cath showed 60% prox RCA, mid-distal RCA 50%, Echo normal, no stents.   04/11/2014 - 08/07/2014 Chemotherapy Continue Revlimid at 2.5 mg 3 weeks on 1 week off   08/08/2014 - 08/13/2014 Hospital Admission Hospital admission for pneumonia. Her Revlimid was held.   08/29/2014 - 09/14/2014 Chemotherapy Maintenance Revlimid restarted   09/14/2014 - 09/17/2014 Hospital Admission [place] pneumonia [place].   11/13/2014 -  Chemotherapy Maintenance Revlimid restarted, changed to 2.24m41maily, 2 weks on, 1 week off from 12/15/2014         INTERVAL HISTORY:   Ms. MorHinchturns as scheduled. She continues Revlimid 2.5 mg 2 weeks on/1 week off. She completed the most recent cycle beginning 08/28/2015. She denies nausea/vomiting. No mouth sores. No diarrhea. Bowels moving regularly. No numbness or tingling in the hands. She does have some numbness in her feet. This does not interfere with activity.  Objective:  Vital signs in last 24 hours:  Blood pressure 131/69, pulse 70, temperature 97.6 F (36.4 C), temperature source Oral, resp. rate 17, height 5' 7" (1.702 m), weight 130 lb 8 oz (59.194 kg), SpO2 100 %.    HEENT: No thrush or ulcers. Lymphatics: No palpable cervical, supraclavicular lymph nodes. Resp: Lungs clear bilaterally. Cardio: Regular rate and rhythm. GI: Abdomen soft and nontender. No organomegaly. Vascular: Trace lower leg edema bilaterally. Calves soft and nontender. Skin: No rash.    Lab Results:  Lab Results  Component Value Date   WBC 3.3* 09/14/2015   HGB 10.8* 09/14/2015   HCT 32.1* 09/14/2015   MCV 103.9* 09/14/2015  PLT 144* 09/14/2015   NEUTROABS 2.1 09/14/2015    Imaging:  No results found.  Medications: I have reviewed the patient's current medications.  Assessment/Plan: 1. IgG kappa Multiple myeloma s/p Auto SCT, currently on maintenance Revlimid 2.5  mg 2 weeks on/1 week off. 2. Mild pancytopenia secondary to chemotherapy. Stable. 3. Osteoporosis. She receives Zometa every 3 months. Next Zometa due in April 2017. 4. CAD, inferior STEMI August 2015. She is on Plavix, metoprolol and Lipitor. She is followed by Dr. Nishan.   Disposition: Shannon Rush appears stable. The plan is to continue maintenance Revlimid. She will begin the next cycle as planned on 09/18/2015. She will return for labs in 3 weeks and a follow-up visit in 6 weeks. She will receive Zometa when she is here for her 6 week visit. She will contact the office between visits with any problems.    Thomas, Lisa ANP/GNP-BC   09/14/2015  3:00 PM         

## 2015-09-15 LAB — KAPPA/LAMBDA LIGHT CHAINS
Ig Kappa Free Light Chain: 91.71 mg/L — ABNORMAL HIGH (ref 3.30–19.40)
Ig Lambda Free Light Chain: 20.17 mg/L (ref 5.71–26.30)
KAPPA/LAMBDA FLC RATIO: 4.55 — AB (ref 0.26–1.65)

## 2015-09-18 LAB — MULTIPLE MYELOMA PANEL, SERUM
ALBUMIN/GLOB SERPL: 1.3 (ref 0.7–1.7)
ALPHA2 GLOB SERPL ELPH-MCNC: 0.5 g/dL (ref 0.4–1.0)
Albumin SerPl Elph-Mcnc: 4 g/dL (ref 2.9–4.4)
Alpha 1: 0.2 g/dL (ref 0.0–0.4)
B-Globulin SerPl Elph-Mcnc: 0.8 g/dL (ref 0.7–1.3)
Gamma Glob SerPl Elph-Mcnc: 1.6 g/dL (ref 0.4–1.8)
Globulin, Total: 3.1 g/dL (ref 2.2–3.9)
IGA/IMMUNOGLOBULIN A, SERUM: 123 mg/dL (ref 64–422)
IGM (IMMUNOGLOBIN M), SRM: 31 mg/dL (ref 26–217)
M Protein SerPl Elph-Mcnc: 0.7 g/dL — ABNORMAL HIGH
Total Protein: 7.1 g/dL (ref 6.0–8.5)

## 2015-10-02 ENCOUNTER — Other Ambulatory Visit: Payer: Self-pay | Admitting: *Deleted

## 2015-10-02 DIAGNOSIS — C9000 Multiple myeloma not having achieved remission: Secondary | ICD-10-CM

## 2015-10-02 MED ORDER — REVLIMID 2.5 MG PO CAPS: 2.5000 mg | ORAL_CAPSULE | Freq: Every day | ORAL | Status: DC

## 2015-10-24 ENCOUNTER — Other Ambulatory Visit: Payer: Self-pay | Admitting: *Deleted

## 2015-10-24 DIAGNOSIS — C9000 Multiple myeloma not having achieved remission: Secondary | ICD-10-CM

## 2015-10-24 MED ORDER — REVLIMID 2.5 MG PO CAPS: 2.5000 mg | ORAL_CAPSULE | Freq: Every day | ORAL | Status: DC

## 2015-10-26 ENCOUNTER — Telehealth: Payer: Self-pay | Admitting: Hematology

## 2015-10-26 ENCOUNTER — Other Ambulatory Visit (HOSPITAL_BASED_OUTPATIENT_CLINIC_OR_DEPARTMENT_OTHER): Payer: Medicare Other

## 2015-10-26 ENCOUNTER — Encounter: Payer: Self-pay | Admitting: Hematology

## 2015-10-26 ENCOUNTER — Ambulatory Visit (HOSPITAL_BASED_OUTPATIENT_CLINIC_OR_DEPARTMENT_OTHER): Payer: Medicare Other

## 2015-10-26 ENCOUNTER — Ambulatory Visit (HOSPITAL_BASED_OUTPATIENT_CLINIC_OR_DEPARTMENT_OTHER): Payer: Medicare Other | Admitting: Hematology

## 2015-10-26 VITALS — BP 125/60 | HR 67 | Temp 97.5°F | Resp 18 | Ht 67.0 in | Wt 129.6 lb

## 2015-10-26 DIAGNOSIS — I252 Old myocardial infarction: Secondary | ICD-10-CM | POA: Diagnosis not present

## 2015-10-26 DIAGNOSIS — D61818 Other pancytopenia: Secondary | ICD-10-CM | POA: Diagnosis not present

## 2015-10-26 DIAGNOSIS — M810 Age-related osteoporosis without current pathological fracture: Secondary | ICD-10-CM | POA: Diagnosis not present

## 2015-10-26 DIAGNOSIS — C9000 Multiple myeloma not having achieved remission: Secondary | ICD-10-CM | POA: Diagnosis not present

## 2015-10-26 LAB — COMPREHENSIVE METABOLIC PANEL
ALBUMIN: 3.7 g/dL (ref 3.5–5.0)
ALK PHOS: 57 U/L (ref 40–150)
ALT: 18 U/L (ref 0–55)
AST: 27 U/L (ref 5–34)
Anion Gap: 8 mEq/L (ref 3–11)
BILIRUBIN TOTAL: 0.78 mg/dL (ref 0.20–1.20)
BUN: 15.9 mg/dL (ref 7.0–26.0)
CO2: 23 mEq/L (ref 22–29)
CREATININE: 0.9 mg/dL (ref 0.6–1.1)
Calcium: 9.3 mg/dL (ref 8.4–10.4)
Chloride: 109 mEq/L (ref 98–109)
EGFR: 67 mL/min/{1.73_m2} — AB (ref >90)
GLUCOSE: 81 mg/dL (ref 70–140)
Potassium: 3.7 mEq/L (ref 3.5–5.1)
SODIUM: 140 meq/L (ref 136–145)
TOTAL PROTEIN: 7.8 g/dL (ref 6.4–8.3)

## 2015-10-26 LAB — CBC WITH DIFFERENTIAL/PLATELET
BASO%: 1 % (ref 0.0–2.0)
Basophils Absolute: 0 10*3/uL (ref 0.0–0.1)
EOS ABS: 0.1 10*3/uL (ref 0.0–0.5)
EOS%: 3.3 % (ref 0.0–7.0)
HCT: 33.1 % — ABNORMAL LOW (ref 34.8–46.6)
HEMOGLOBIN: 10.9 g/dL — AB (ref 11.6–15.9)
LYMPH%: 21.7 % (ref 14.0–49.7)
MCH: 34.1 pg — ABNORMAL HIGH (ref 25.1–34.0)
MCHC: 32.9 g/dL (ref 31.5–36.0)
MCV: 103.4 fL — AB (ref 79.5–101.0)
MONO#: 0.3 10*3/uL (ref 0.1–0.9)
MONO%: 10.5 % (ref 0.0–14.0)
NEUT%: 63.5 % (ref 38.4–76.8)
NEUTROS ABS: 1.9 10*3/uL (ref 1.5–6.5)
PLATELETS: 150 10*3/uL (ref 145–400)
RBC: 3.2 10*6/uL — ABNORMAL LOW (ref 3.70–5.45)
RDW: 15.1 % — AB (ref 11.2–14.5)
WBC: 3 10*3/uL — AB (ref 3.9–10.3)
lymph#: 0.7 10*3/uL — ABNORMAL LOW (ref 0.9–3.3)

## 2015-10-26 MED ORDER — ZOLEDRONIC ACID 4 MG/5ML IV CONC
3.5000 mg | Freq: Once | INTRAVENOUS | Status: AC
  Administered 2015-10-26: 3.5 mg via INTRAVENOUS
  Filled 2015-10-26: qty 4.38

## 2015-10-26 MED ORDER — SODIUM CHLORIDE 0.9 % IV SOLN
Freq: Once | INTRAVENOUS | Status: AC
  Administered 2015-10-26: 12:00:00 via INTRAVENOUS

## 2015-10-26 NOTE — Progress Notes (Signed)
[place], MD [place] [address]  DIAGNOSIS: Multiple myeloma not having achieved remission ([place]) - Plan: Multiple Myeloma Panel (SPEP&IFE w/QIG)  Osteoporosis  History of ST elevation myocardial infarction (STEMI)  Pancytopenia (HCC)  CHIEF COMPLAINS: Follow-up of multiple myeloma  CURRENT TREATMENT:  Zometa 3.5 mg monthly started on 06/2013;  Revlimid 5 mg daily for 21 days then off for 7 days for maintenance for at least 2 years started on 09/02/2013.  Decreased to Revlimid 2.5 mg daily for 21 days then off for 7 days on 12/30/2013, and changed to 2.66m daily for 14 days then off for 7 days on 12/15/2014 due to multiple infection.      [place]   05/26/2012 Initial Diagnosis Presenting IgG was 4,040 mg/dL on 06/05/2012 (IgA 35; IgM 34); kappa free light chain 34.4 mg/dL, lambda 0.00, kappa:lambda ratio of 34.75; SPEP with M-spike of 2.60.   06/30/2012 Imaging Numerous lytic lesions throughout the calvarium.  Lytic lesion within the left lateral scapula.  Findings most compatible with metastases or myeloma. Multiple mid and lower thoracic compression fractures.  Slight compression through the endplates at L3.   140/08/6761Bone Marrow Biopsy Bone marrow biopsy showed 49% plasma cell. Normal classical cytogenetics; however, myeloma FISH panel showed 13q- (intermediate risk)       07/14/2012 - 07/27/2012 Radiation Therapy Palliative radiation 20 Gy over 10 fractions between to thoracic spine cord compression and symptomatic left scapula lytic lesion.   08/10/2012 -  Chemotherapy Started SQ Velcade once weekly, 3 weeks on, 1 weeks off; daily Revlimid d1-21, 7 days off; and Dexamethasone 459mPO weekly.    02/12/2013 [facility] Mar[facility]   06/14/2013 Tumor Marker IgG  1830,  Kappa:lamba ratio, 1.69 (baseline was 34.75 or   M-spike 0.28 (baseline of  2.6 or  89.3% of baseline).     06/25/2013 -  Chemotherapy Started zometa 3.5 mg monthly    09/01/2013 -  Chemotherapy Maintenance therapy with revlimid 48m648maily for 14 days and then off for 7 (decreased from 21 days on and 7 days off based on neutropenia).    09/13/2013 - 09/17/2014 Hospital Admission Hospital admission for pneumonia   12/20/2013 Treatment Plan Change Maintenance therapy decreased to 2.5 mg daily for 21 days and then off for 7 days based on low counts.    01/25/2014 Tumor Marker IgG 1360 M spike 0.5   03/01/2014 - 03/05/2014 [facility], received thrombolytic therapy, cath showed 60% prox RCA, mid-distal RCA 50%, Echo normal, no stents.   04/11/2014 - 08/07/2014 Chemotherapy Continue Revlimid at 2.5 mg 3 weeks on 1 week off   08/08/2014 - 08/13/2014 Hospital Admission Hospital admission for pneumonia. Her Revlimid was held.   08/29/2014 - 09/14/2014 Chemotherapy Maintenance Revlimid restarted   09/14/2014 - 09/17/2014 Hospital Admission [place] pneumonia [place].   11/13/2014 -  Chemotherapy Maintenance Revlimid restarted, changed to 2.48mg10mily, 2 weks on, 1 week off from 12/15/2014    INTERVAL HISTORY:  Shannon Rush. female with a past medical history of MM as detailed above, s/p SCT is here for follow-up.   She is doing well overall, denies any new change since her last visit. She remains to be very physically active, no recent prostate trouble. She  has stable mild joint and muscular discomfort, not taking any pain medication, appetite and energy level remains to be good, no fever chills or episodes of infection lately.  MEDICAL HISTORY: Past Medical History  Diagnosis Date  . Multiple myeloma ([place]) 07/01/2012  . Unspecified deficiency anemia   . OSTEOPOROSIS 06/11/2010    Multiple compression fractures; and spontaneous fracture of sternum Qualifier:  Diagnosis of  By: [name], [name]     . 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 Cord compression ([place]) 07/13/12    MRI- diffuse myeloma involvement of T-L spine  . History of radiation therapy 07/13/12-07/27/12    spinal cord compression T3-T10,left scapula  . CAD (coronary artery disease)     a. inf STEMI (in [place] >>> lytics) >>> LHC (8/15- [place]):  pRCA 60%, mid to dist RCA 50% >>> med rx  . Hx of echocardiogram     Echo (03/02/14-done at [facility]):  Normal LVEF without significant regional wall motion abnormalities. Mild      ALLERGIES:  is allergic to zosyn; piperacillin-tazobactam in dex; zithromax; and ciprofloxacin.  MEDICATIONS: has a current medication list which includes the following prescription(s): acetaminophen, atorvastatin, calcium carbonate, clopidogrel, metoprolol succinate, proair hfa, revlimid, vitamin d (cholecalciferol), morphine, and nitroglycerin.  SURGICAL HISTORY:  Past Surgical History  Procedure Laterality Date  . Appendectomy    . Tubal ligation    . Cesarean section      x2   . Hip surgery  2009    left  . Elbow surgery    . Colonoscopy  2007    neg with Dr. [name]    REVIEW OF SYSTEMS:   Constitutional: Denies abnormal weight loss, (+) fatigue Eyes: Denies blurriness of vision Ears, nose, mouth, throat, and face: Denies mucositis or sore throat, (+) left tooth pain  Respiratory: Denies cough, dyspnea or wheezes Cardiovascular: Denies palpitation, chest discomfort or lower extremity swelling Gastrointestinal:  Denies nausea, heartburn or change in bowel habits Skin: Denies abnormal skin rashes Lymphatics: Denies new lymphadenopathy or easy bruising Neurological:Denies numbness, tingling or new weaknesses Behavioral/Psych: Mood is stable, no new changes  All other systems were reviewed with the patient and are negative.  PHYSICAL EXAMINATION: ECOG PERFORMANCE STATUS: 0  Blood  pressure 125/60, pulse 67, temperature 97.5 F (36.4 C), temperature source Oral, resp. rate 18, height 5' 7"  (1.702 m), weight 129 lb 9.6 oz (58.786 kg), SpO2 100 %.  GENERAL:alert, no distress and comfortable; easily mobile to exam table; kyphosis, moderate.  SKIN: skin color, texture, turgor are normal, no rashes or significant lesions; Abrasion well healed on RLE.  EYES: normal, Conjunctiva are pink, 1 tear/injectionof blood vessel left eye in sclera. OROPHARYNX:no exudate, no erythema and lips, buccal mucosa, and tongue normal, no ONJ NECK: supple, thyroid normal size, non-tender, without nodularity; nodules cervical bilaterally.  LYMPH:  no palpable lymphadenopathy in the cervical, axillary or supraclavicular LUNGS: crackles at the bases bilaterally with normal breathing effort, no wheezes or rhonchi HEART: regular rate & rhythm and no murmurs and no lower extremity edema ABDOMEN:abdomen soft, non-tender and normal bowel sounds Musculoskeletal:no cyanosis of digits and no clubbing, (+) spine scoliosis and mild tenderness  NEURO: alert & oriented x 3 with fluent speech, no focal motor/sensory deficits  Labs:  CBC Latest Ref Rng 10/26/2015 09/14/2015 08/17/2015  WBC 3.9 - 10.3 10e3/uL 3.0(L) 3.3(L) 3.4(L)  Hemoglobin 11.6 - 15.9 g/dL 10.9(L) 10.8(L) 11.7  Hematocrit 34.8 - 46.6 %  33.1(L) 32.1(L) 35.7  Platelets 145 - 400 10e3/uL 150 144(L) 148    CMP Latest Ref Rng 10/26/2015 09/14/2015 08/17/2015  Glucose 70 - 140 mg/dl 81 - 86  BUN 7.0 - 26.0 mg/dL 15.9 - 17.6  Creatinine 0.6 - 1.1 mg/dL 0.9 - 0.9  Sodium 136 - 145 mEq/L 140 - 140  Potassium 3.5 - 5.1 mEq/L 3.7 - 3.6  CO2 22 - 29 mEq/L 23 - 24  Calcium 8.4 - 10.4 mg/dL 9.3 - 9.2  Total Protein 6.4 - 8.3 g/dL 7.8 7.1 7.9  Total Bilirubin 0.20 - 1.20 mg/dL 0.78 - 0.80  Alkaline Phos 40 - 150 U/L 57 - 56  AST 5 - 34 U/L 27 - 27  ALT 0 - 55 U/L 18 - 22   SPEP M-PROTEIN 10/13/2014: 0.6 11/21/2014: 0.5 01/30/2015: 0.6 03/13/2015:  0.6 04/20/2015: 0.5 06/01/2015: 0.5 07/20/2015: 0.7 09/14/2015: 0.7  IgG ([phone]) 10/13/2014: 1710 11/21/2014: 1460 01/30/2015: 1380 03/13/15: 1570 04/20/2015: 1550 06/01/2015: 1590 07/22/2015: 1770 09/14/2015: 1787   Serum kappa, lambda light chains, and ratio  11/21/2014: 3.95, 2.08, 1.90  01/30/2015: 3.92, 2.64, 1.48 03/13/2015: 3.55, 2.17, 1.64 04/20/15: 4.52, 1.78, 2.54 06/01/2015: 4.82, 1.75, 2.75 07/20/2015: 8.71, 2.43, 3.58 09/14/2015: 9.17, 2.02, 4.55  24 hr urine PEP 10/13/2014: (-)  RADIOGRAPHIC STUDIES: None  ASSESSMENT: Shannon Rush 75 y.o. female with a history of Multiple Myeloma s/p autotransplant in August 2014 now on maintenence low dose Revlimid therapy  PLAN:   1. IgG kappa Multiple myeloma s/p Auto SCT, intermediate risk, not in remission  --MM markers demonstrate a very good response (M protein baseline 2.6, down to 0.28 after induction chemo and transplant, stable around 0.5 since July 2015,  Most recent 0.7 in 09/2015 -Her quantitative IgG and kappa light chain level has slightly increased, will continue monitoring  - Acyclovir has been stopped after 1 year of SCT.   -Revlimid was held in April 2016 due to her pneumonia, and restarted it again, reduced to 2.5 mg 2 weeks on, one-week off -I reviewed her lab results with her, mild leukopenia, adequate for treatment.  -Her disease has been very stable overall. She will repeat CBC, CMP and MM lab every 2 cycles (6 weeks)  2. Pancytopenia due to chemotherapy. -overall mild, no recent recurrent infection except a cold and sinus infection lately   -Continue monitoring  3. Osteoporosis -recently repeated bone density scan showed worsening osteoporosis, continue zometa, changed to every 3 months after 03/2015.  -she declined repeating DEXA  -continue Zometa every 3 months  4. CAD, inferior STEMI in 02/2014 -continue plavix, metoprolol and lipitor  -follow up with Dr. Johnsie Cancel     Plan -Start next cycle Revlimid on 4/18,  2.101m 2 weeks on, one week off -Zometa infusion today and continue every 3 months -return to clinic in 6 weeks with lab   All questions were answered. The patient knows to call the clinic with any problems, questions or concerns. We can certainly see the patient much sooner if necessary.  I spent 20 minutes counseling the patient face to face. The total time spent in the appointment was 25 minutes.   FTruitt Merle 10/26/2015

## 2015-10-26 NOTE — Patient Instructions (Signed)
Zoledronic Acid injection (Paget's Disease, Osteoporosis) °What is this medicine? °ZOLEDRONIC ACID (ZOE le dron ik AS id) lowers the amount of calcium loss from bone. It is used to treat Paget's disease and osteoporosis in women. °This medicine may be used for other purposes; ask your health care provider or pharmacist if you have questions. °What should I tell my health care provider before I take this medicine? °They need to know if you have any of these conditions: °-aspirin-sensitive asthma °-cancer, especially if you are receiving medicines used to treat cancer °-dental disease or wear dentures °-infection °-kidney disease °-low levels of calcium in the blood °-past surgery on the parathyroid gland or intestines °-receiving corticosteroids like dexamethasone or prednisone °-an unusual or allergic reaction to zoledronic acid, other medicines, foods, dyes, or preservatives °-pregnant or trying to get pregnant °-breast-feeding °How should I use this medicine? °This medicine is for infusion into a vein. It is given by a health care professional in a hospital or clinic setting. °Talk to your pediatrician regarding the use of this medicine in children. This medicine is not approved for use in children. °Overdosage: If you think you have taken too much of this medicine contact a poison control center or emergency room at once. °NOTE: This medicine is only for you. Do not share this medicine with others. °What if I miss a dose? °It is important not to miss your dose. Call your doctor or health care professional if you are unable to keep an appointment. °What may interact with this medicine? °-certain antibiotics given by injection °-NSAIDs, medicines for pain and inflammation, like ibuprofen or naproxen °-some diuretics like bumetanide, furosemide °-teriparatide °This list may not describe all possible interactions. Give your health care provider a list of all the medicines, herbs, non-prescription drugs, or dietary  supplements you use. Also tell them if you smoke, drink alcohol, or use illegal drugs. Some items may interact with your medicine. °What should I watch for while using this medicine? °Visit your doctor or health care professional for regular checkups. It may be some time before you see the benefit from this medicine. Do not stop taking your medicine unless your doctor tells you to. Your doctor may order blood tests or other tests to see how you are doing. °Women should inform their doctor if they wish to become pregnant or think they might be pregnant. There is a potential for serious side effects to an unborn child. Talk to your health care professional or pharmacist for more information. °You should make sure that you get enough calcium and vitamin D while you are taking this medicine. Discuss the foods you eat and the vitamins you take with your health care professional. °Some people who take this medicine have severe bone, joint, and/or muscle pain. This medicine may also increase your risk for jaw problems or a broken thigh bone. Tell your doctor right away if you have severe pain in your jaw, bones, joints, or muscles. Tell your doctor if you have any pain that does not go away or that gets worse. °Tell your dentist and dental surgeon that you are taking this medicine. You should not have major dental surgery while on this medicine. See your dentist to have a dental exam and fix any dental problems before starting this medicine. Take good care of your teeth while on this medicine. Make sure you see your dentist for regular follow-up appointments. °What side effects may I notice from receiving this medicine? °Side effects that you should   report to your doctor or health care professional as soon as possible: °-allergic reactions like skin rash, itching or hives, swelling of the face, lips, or tongue °-anxiety, confusion, or depression °-breathing problems °-changes in vision °-eye pain °-feeling faint or  lightheaded, falls °-jaw pain, especially after dental work °-mouth sores °-muscle cramps, stiffness, or weakness °-redness, blistering, peeling or loosening of the skin, including inside the mouth °-trouble passing urine or change in the amount of urine °Side effects that usually do not require medical attention (report to your doctor or health care professional if they continue or are bothersome): °-bone, joint, or muscle pain °-constipation °-diarrhea °-fever °-hair loss °-irritation at site where injected °-loss of appetite °-nausea, vomiting °-stomach upset °-trouble sleeping °-trouble swallowing °-weak or tired °This list may not describe all possible side effects. Call your doctor for medical advice about side effects. You may report side effects to FDA at 1-800-FDA-1088. °Where should I keep my medicine? °This drug is given in a hospital or clinic and will not be stored at home. °NOTE: This sheet is a summary. It may not cover all possible information. If you have questions about this medicine, talk to your doctor, pharmacist, or health care provider. °  °© 2016, [employer]. (2013-11-27 14:19:57) ° °

## 2015-10-26 NOTE — Telephone Encounter (Signed)
Added appt pt will p/u in tx

## 2015-10-27 LAB — KAPPA/LAMBDA LIGHT CHAINS
IG LAMBDA FREE LIGHT CHAIN: 17.75 mg/L (ref 5.71–26.30)
Ig Kappa Free Light Chain: 113.88 mg/L — ABNORMAL HIGH (ref 3.30–19.40)
KAPPA/LAMBDA FLC RATIO: 6.42 — AB (ref 0.26–1.65)

## 2015-10-30 LAB — MULTIPLE MYELOMA PANEL, SERUM
ALBUMIN/GLOB SERPL: 1.1 (ref 0.7–1.7)
ALPHA2 GLOB SERPL ELPH-MCNC: 0.6 g/dL (ref 0.4–1.0)
Albumin SerPl Elph-Mcnc: 3.7 g/dL (ref 2.9–4.4)
Alpha 1: 0.2 g/dL (ref 0.0–0.4)
B-GLOBULIN SERPL ELPH-MCNC: 0.8 g/dL (ref 0.7–1.3)
GAMMA GLOB SERPL ELPH-MCNC: 1.8 g/dL (ref 0.4–1.8)
GLOBULIN, TOTAL: 3.4 g/dL (ref 2.2–3.9)
IgA, Qn, Serum: 123 mg/dL (ref 64–422)
IgM, Qn, Serum: 30 mg/dL (ref 26–217)
M PROTEIN SERPL ELPH-MCNC: 0.8 g/dL — AB
TOTAL PROTEIN: 7.1 g/dL (ref 6.0–8.5)

## 2015-10-31 NOTE — Progress Notes (Signed)
Patient ID: ZAIDEE HOLLOMAN, female   DOB: March 29, 1941, 75 y.o.   MRN: [id number]   74 y.o.  F/u abnormal echo and hypercoagulable state on Rx for myeloma   Read report from 12/10/13   EF normal valves ok " linear mobile density in aortic root above aortic valve. She has not had any cardiac symptoms She has not had any TIA;s No clinical history of SBE, fevers. No history of connective tissue disease. She has been Rx for myeloma and is thinking about a BMT for this. She has no chest pain dyspnea or palpitation No history of vascular disease aneurysm or hypercoagulable state  Reviewed echo done at [facility] 8/15  With ? Lambel's excresence  Trivial MR/AR and normal EF with no HOCM.  Recommended to have TEE done at [facility] with agreement of above diagnosis and no SBE/Disection  Admitted 8/18-8/22 to [facility] with an inferior STEMI. She received thrombolytics. Cardiac catheterization demonstrated 60% proximal RCA, mid to distal RCA 50% (lesion segmental and thrombotic). Echocardiogram demonstrated normal LV function. Patient was continued on heparin and aspirin. No intervention was performed. Recommendation was for the patient to return to [facility] and have a nuclear stress test in 6 weeks. Patient was on Revlimid (for pancytopenia from chemotherapy) and the DC summary from the hospital in [place] cites this as a possible cause for her STEMI.  She returns for FU. She is doing well. She denies chest pain. She is an avid bike rider. She has been back to cycling without chest discomfort or significant shortness of breath. She denies syncope. Denies orthopnea, PND or edema. She denies significant palpitations.  Studies:  - LHC (8/15- [place]): pRCA 60%, mid to dist RCA 50% >>> med rx  - Echo (03/02/14-done at [facility]): Normal LVEF without significant regional wall motion abnormalities. Mild  left atrial enlargement. No significant valvular  abnormalities.   Doing well ? Hypercoagulable on revlamid  Discussed coumadin but she and I prefer to maintain ASA/Plavix  She is riding her bike everywhere again   Admitted to hospital with pneumonia 08/2014 after trip to [name]  Will restart revlamid in May   ROS: Denies fever, malais, weight loss, blurry vision, decreased visual acuity, cough, sputum, SOB, hemoptysis, pleuritic pain, palpitaitons, heartburn, abdominal pain, melena, lower extremity edema, claudication, or rash.  All other systems reviewed and negative  General: Affect appropriate Healthy:  appears stated age Kyphotic  HEENT: normal Neck supple with no adenopathy JVP normal no bruits no thyromegaly Lungs clear with no wheezing and good diaphragmatic motion Heart:  S1/S2 no murmur, no rub, gallop or click PMI normal Abdomen: benighn, BS positve, no tenderness, no AAA no bruit.  No HSM or HJR Distal pulses intact with no bruits No edema Neuro non-focal Skin warm and dry No muscular weakness   Current Outpatient Prescriptions  Medication Sig Dispense Refill  . acetaminophen (TYLENOL) 500 MG tablet Take 500 mg by mouth every 6 (six) hours as needed for moderate pain (pain). Reported on 07/20/2015    . atorvastatin (LIPITOR) 40 MG tablet Take 1 tablet (40 mg total) by mouth daily. 90 tablet 1  . calcium carbonate (OS-CAL) 600 MG TABS tablet Take 600 mg by mouth daily.    . clopidogrel (PLAVIX) 75 MG tablet Take 1 tablet (75 mg total) by mouth daily. 90 tablet 3  . metoprolol succinate (TOPROL-XL) 25 MG 24 hr tablet Take 1 tablet (25 mg total)  by mouth daily. 90 tablet 3  . morphine (MSIR) 15 MG tablet Take 1 tablet (15 mg total) by mouth every 6 (six) hours as needed for severe pain. 30 tablet 0  . nitroGLYCERIN (NITROSTAT) 0.4 MG SL tablet Place under the tongue. Reported on 07/20/2015    . PROAIR HFA 108 (90 BASE) MCG/ACT inhaler Inhale 2 puffs into the lungs every 6 (six) hours as needed.  2  . REVLIMID 2.5 MG  capsule Take 1 capsule (2.5 mg total) by mouth daily. Take 1 capsule  2.5 mg  by mouth daily for 14 days, then rest 7 days. 14 capsule 0   No current facility-administered medications for this visit.    Allergies  Zosyn; Piperacillin-tazobactam in dex; Zithromax; and Ciprofloxacin  Electrocardiogram:   03/04/14  SR marked inferolateral T wave inversions  10/27/14  SR rate 78 normal  11/03/15  SR rate 89  Inferior T wave changes   Assessment and Plan Myeloma: f/u with oncology on revlimid 2weeks/ then off a week MI: discussed importance of DAT , beta blocker and statin she does not like taking meds has not been compliant with ASA  Chol:  Labs followed by primary on statin  Lab Results  Component Value Date   LDLCALC 120* 06/02/2009    Hypercoagulable  ? From revlamid conitnue DAT    Jenkins Rouge

## 2015-11-03 ENCOUNTER — Ambulatory Visit (INDEPENDENT_AMBULATORY_CARE_PROVIDER_SITE_OTHER): Payer: Medicare Other | Admitting: Cardiovascular Disease

## 2015-11-03 ENCOUNTER — Encounter: Payer: Self-pay | Admitting: Cardiovascular Disease

## 2015-11-03 VITALS — BP 104/62 | HR 89 | Ht 67.5 in | Wt 127.8 lb

## 2015-11-03 DIAGNOSIS — I2510 Atherosclerotic heart disease of native coronary artery without angina pectoris: Secondary | ICD-10-CM | POA: Diagnosis not present

## 2015-11-03 NOTE — Patient Instructions (Signed)

## 2015-11-10 ENCOUNTER — Other Ambulatory Visit: Payer: Self-pay | Admitting: Hematology

## 2015-11-14 ENCOUNTER — Other Ambulatory Visit: Payer: Self-pay | Admitting: *Deleted

## 2015-11-14 DIAGNOSIS — C9000 Multiple myeloma not having achieved remission: Secondary | ICD-10-CM

## 2015-11-14 MED ORDER — REVLIMID 2.5 MG PO CAPS: 2.5000 mg | ORAL_CAPSULE | Freq: Every day | ORAL | Status: DC

## 2015-11-30 ENCOUNTER — Other Ambulatory Visit: Payer: Self-pay | Admitting: Hematology

## 2015-12-07 ENCOUNTER — Ambulatory Visit (HOSPITAL_BASED_OUTPATIENT_CLINIC_OR_DEPARTMENT_OTHER): Payer: Medicare Other | Admitting: Hematology

## 2015-12-07 ENCOUNTER — Encounter: Payer: Self-pay | Admitting: Hematology

## 2015-12-07 ENCOUNTER — Telehealth: Payer: Self-pay | Admitting: Hematology

## 2015-12-07 ENCOUNTER — Telehealth: Payer: Self-pay | Admitting: *Deleted

## 2015-12-07 ENCOUNTER — Other Ambulatory Visit (HOSPITAL_BASED_OUTPATIENT_CLINIC_OR_DEPARTMENT_OTHER): Payer: Medicare Other

## 2015-12-07 VITALS — BP 128/62 | HR 77 | Temp 98.7°F | Resp 18 | Ht 67.5 in | Wt 127.5 lb

## 2015-12-07 DIAGNOSIS — C9000 Multiple myeloma not having achieved remission: Secondary | ICD-10-CM

## 2015-12-07 DIAGNOSIS — M810 Age-related osteoporosis without current pathological fracture: Secondary | ICD-10-CM | POA: Diagnosis not present

## 2015-12-07 DIAGNOSIS — D61818 Other pancytopenia: Secondary | ICD-10-CM

## 2015-12-07 DIAGNOSIS — I252 Old myocardial infarction: Secondary | ICD-10-CM | POA: Diagnosis not present

## 2015-12-07 LAB — CBC WITH DIFFERENTIAL/PLATELET
BASO%: 1.3 % (ref 0.0–2.0)
Basophils Absolute: 0 10*3/uL (ref 0.0–0.1)
EOS ABS: 0.1 10*3/uL (ref 0.0–0.5)
EOS%: 3.3 % (ref 0.0–7.0)
HCT: 35.8 % (ref 34.8–46.6)
HEMOGLOBIN: 12.1 g/dL (ref 11.6–15.9)
LYMPH%: 24.9 % (ref 14.0–49.7)
MCH: 34.8 pg — ABNORMAL HIGH (ref 25.1–34.0)
MCHC: 33.8 g/dL (ref 31.5–36.0)
MCV: 102.9 fL — AB (ref 79.5–101.0)
MONO#: 0.4 10*3/uL (ref 0.1–0.9)
MONO%: 12.8 % (ref 0.0–14.0)
NEUT%: 57.7 % (ref 38.4–76.8)
NEUTROS ABS: 1.8 10*3/uL (ref 1.5–6.5)
Platelets: 173 10*3/uL (ref 145–400)
RBC: 3.48 10*6/uL — ABNORMAL LOW (ref 3.70–5.45)
RDW: 15 % — AB (ref 11.2–14.5)
WBC: 3.1 10*3/uL — AB (ref 3.9–10.3)
lymph#: 0.8 10*3/uL — ABNORMAL LOW (ref 0.9–3.3)

## 2015-12-07 LAB — COMPREHENSIVE METABOLIC PANEL
ALBUMIN: 3.9 g/dL (ref 3.5–5.0)
ALT: 19 U/L (ref 0–55)
AST: 25 U/L (ref 5–34)
Alkaline Phosphatase: 59 U/L (ref 40–150)
Anion Gap: 8 mEq/L (ref 3–11)
BILIRUBIN TOTAL: 0.87 mg/dL (ref 0.20–1.20)
BUN: 15.3 mg/dL (ref 7.0–26.0)
CO2: 25 meq/L (ref 22–29)
Calcium: 9.6 mg/dL (ref 8.4–10.4)
Chloride: 106 mEq/L (ref 98–109)
Creatinine: 0.9 mg/dL (ref 0.6–1.1)
EGFR: 62 mL/min/{1.73_m2} — ABNORMAL LOW (ref >90)
GLUCOSE: 76 mg/dL (ref 70–140)
Potassium: 3.8 mEq/L (ref 3.5–5.1)
SODIUM: 139 meq/L (ref 136–145)
TOTAL PROTEIN: 8.1 g/dL (ref 6.4–8.3)

## 2015-12-07 NOTE — Telephone Encounter (Signed)
Per staff message and POF I have scheduled appts. Advised scheduler of appts. JMW  

## 2015-12-07 NOTE — Progress Notes (Signed)
[place], MD [place] [address]  DIAGNOSIS: No diagnosis found.  CHIEF COMPLAINS: Follow-up of multiple myeloma  CURRENT TREATMENT:  Zometa 3.5 mg monthly started on 06/2013, on every 3 months now;  Revlimid 5 mg daily for 21 days then off for 7 days for maintenance for at least 2 years started on 09/02/2013.  Decreased to Revlimid 2.5 mg daily for 21 days then off for 7 days on 12/30/2013, and changed to 2.83m daily for 14 days then off for 7 days on 12/15/2014 due to multiple infection.      Multiple myeloma ([place]   05/26/2012 Initial Diagnosis Presenting IgG was 4,040 mg/dL on 06/05/2012 (IgA 35; IgM 34); kappa free light chain 34.4 mg/dL, lambda 0.00, kappa:lambda ratio of 34.75; SPEP with M-spike of 2.60.   06/30/2012 Imaging Numerous lytic lesions throughout the calvarium.  Lytic lesion within the left lateral scapula.  Findings most compatible with metastases or myeloma. Multiple mid and lower thoracic compression fractures.  Slight compression through the endplates at L3.   163/33/5456Bone Marrow Biopsy Bone marrow biopsy showed 49% plasma cell. Normal classical cytogenetics; however, myeloma FISH panel showed 13q- (intermediate risk)       07/14/2012 - 07/27/2012 Radiation Therapy Palliative radiation 20 Gy over 10 fractions between to thoracic spine cord compression and symptomatic left scapula lytic lesion.   08/10/2012 -  Chemotherapy Started SQ Velcade once weekly, 3 weeks on, 1 weeks off; daily Revlimid d1-21, 7 days off; and Dexamethasone 466mPO weekly.    02/12/2013 Bone Marrow Transplant Auto bone marrow transplant at [facility]   06/14/2013 Tumor Marker IgG  1830,  Kappa:lamba ratio, 1.69 (baseline was 34.75 or   M-spike 0.28 (baseline of 2.6 or  89.3% of baseline).     06/25/2013 -  Chemotherapy Started zometa 3.5 mg monthly    09/01/2013 -  Chemotherapy Maintenance therapy with revlimid  66m33maily for 14 days and then off for 7 (decreased from 21 days on and 7 days off based on neutropenia).    09/13/2013 - 09/17/2014 Hospital Admission Hospital admission for pneumonia   12/20/2013 Treatment Plan Change Maintenance therapy decreased to 2.5 mg daily for 21 days and then off for 7 days based on low counts.    01/25/2014 Tumor Marker IgG 1360 M spike 0.5   03/01/2014 - 03/05/2014 [facility], received thrombolytic therapy, cath showed 60% prox RCA, mid-distal RCA 50%, Echo normal, no stents.   04/11/2014 - 08/07/2014 Chemotherapy Continue Revlimid at 2.5 mg 3 weeks on 1 week off   08/08/2014 - 08/13/2014 Hospital Admission Hospital admission for pneumonia. Her Revlimid was held.   08/29/2014 - 09/14/2014 Chemotherapy Maintenance Revlimid restarted   09/14/2014 - 09/17/2014 Hospital Admission [place].   11/13/2014 -  Chemotherapy Maintenance Revlimid restarted, changed to 2.66mg666mily, 2 weks on, 1 week off from 12/15/2014    INTERVAL HISTORY:  ClaiDIEDRA SINORy31. female with a past medical history of MM as detailed above, s/p SCT is here for follow-up.   She is doing well overall, no new changes since her last visit. She has been tolerating low dose Revlimid maintenance therapy very well, has not had any episodes of infection since March of last year. She does have chronic mild body and joint achiness,  but remains to be very physically active, and functions very well. No neuropathy or other new complaints.  MEDICAL HISTORY: Past Medical History  Diagnosis Date  . Multiple myeloma ([place]) 07/01/2012  . Unspecified deficiency anemia   . OSTEOPOROSIS 06/11/2010    Multiple compression fractures; and spontaneous fracture of sternum Qualifier: Diagnosis of  By: [name], [name]     . 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 Cord compression ([place])  07/13/12    MRI- diffuse myeloma involvement of T-L spine  . History of radiation therapy 07/13/12-07/27/12    spinal cord compression T3-T10,left scapula  . CAD (coronary artery disease)     a. inf STEMI (in [place] >>> [facility]) >>> LHC (8/15- [place]):  pRCA 60%, mid to dist RCA 50% >>> med rx  . Hx of echocardiogram     Echo (03/02/14-done at [facility]):  Normal LVEF without significant regional wall motion abnormalities. Mild      ALLERGIES:  is allergic to zosyn; piperacillin-tazobactam in dex; zithromax; and ciprofloxacin.  MEDICATIONS: has a current medication list which includes the following prescription(s): acetaminophen, atorvastatin, calcium carbonate, clopidogrel, metoprolol succinate, morphine, nitroglycerin, proair hfa, and revlimid.  SURGICAL HISTORY:  Past Surgical History  Procedure Laterality Date  . Appendectomy    . Tubal ligation    . Cesarean section      x2   . Hip surgery  2009    left  . Elbow surgery    . Colonoscopy  2007    neg with Dr. [name]    REVIEW OF SYSTEMS:   Constitutional: Denies abnormal weight loss, (+) fatigue Eyes: Denies blurriness of vision Ears, nose, mouth, throat, and face: Denies mucositis or sore throat, (+) left tooth pain  Respiratory: Denies cough, dyspnea or wheezes Cardiovascular: Denies palpitation, chest discomfort or lower extremity swelling Gastrointestinal:  Denies nausea, heartburn or change in bowel habits Skin: Denies abnormal skin rashes Lymphatics: Denies new lymphadenopathy or easy bruising Neurological:Denies numbness, tingling or new weaknesses Behavioral/Psych: Mood is stable, no new changes  All other systems were reviewed with the patient and are negative.  PHYSICAL EXAMINATION: ECOG PERFORMANCE STATUS: 0  Blood pressure 128/62, pulse 77, temperature 98.7 F (37.1 C), temperature source Oral, resp. rate 18, height 5' 7.5" (1.715 m), weight 127 lb 8 oz  (57.834 kg), SpO2 98 %. GENERAL:alert, no distress and comfortable; easily mobile to exam table; kyphosis, moderate.  SKIN: skin color, texture, turgor are normal, no rashes or significant lesions; Abrasion well healed on RLE.  EYES: normal, Conjunctiva are pink, 1 tear/injectionof blood vessel left eye in sclera. OROPHARYNX:no exudate, no erythema and lips, buccal mucosa, and tongue normal, no ONJ NECK: supple, thyroid normal size, non-tender, without nodularity; nodules cervical bilaterally.  LYMPH:  no palpable lymphadenopathy in the cervical, axillary or supraclavicular LUNGS: crackles at the bases bilaterally with normal breathing effort, no wheezes or rhonchi HEART: regular rate & rhythm and no murmurs and no lower extremity edema ABDOMEN:abdomen soft, non-tender and normal bowel sounds Musculoskeletal:no cyanosis of digits and no clubbing, (+) spine scoliosis and mild tenderness  NEURO: alert & oriented x 3 with fluent speech, no focal motor/sensory deficits  Labs:  CBC Latest Ref Rng 12/07/2015 10/26/2015 09/14/2015  WBC 3.9 - 10.3 10e3/uL 3.1(L) 3.0(L) 3.3(L)  Hemoglobin 11.6 - 15.9 g/dL 12.1 10.9(L) 10.8(L)  Hematocrit 34.8 - 46.6 % 35.8 33.1(L) 32.1(L)  Platelets 145 - 400 10e3/uL 173 150 144(L)  CMP Latest Ref Rng 12/07/2015 12/07/2015 10/26/2015  Glucose 70 - 140 mg/dl 76 - 81  BUN 7.0 - 26.0 mg/dL 15.3 - 15.9  Creatinine 0.6 - 1.1 mg/dL 0.9 - 0.9  Sodium 136 - 145 mEq/L 139 - 140  Potassium 3.5 - 5.1 mEq/L 3.8 - 3.7  CO2 22 - 29 mEq/L 25 - 23  Calcium 8.4 - 10.4 mg/dL 9.6 - 9.3  Total Protein 6.0 - 8.5 g/dL 8.1 7.5 7.8  Total Bilirubin 0.20 - 1.20 mg/dL 0.87 - 0.78  Alkaline Phos 40 - 150 U/L 59 - 57  AST 5 - 34 U/L 25 - 27  ALT 0 - 55 U/L 19 - 18   SPEP M-PROTEIN 10/01/2013: 0.18 (nadir)  10/13/2014: 0.6 11/21/2014: 0.5 01/30/2015: 0.6 03/13/2015: 0.6 04/20/2015: 0.5 06/01/2015: 0.5 07/20/2015: 0.7 09/14/2015: 0.7 10/26/2015: 0.8 12/07/2015: 1.0  IgG  ([phone]) 10/13/2014: 1710 11/21/2014: 1460 01/30/2015: 1380 03/13/15: 1570 04/20/2015: 1550 06/01/2015: 1590 07/22/2015: 1770 09/14/2015: 1787 10/26/2015: 1898 12/07/2015: 2045   Serum kappa, lambda light chains, and ratio  11/21/2014: 3.95, 2.08, 1.90  01/30/2015: 3.92, 2.64, 1.48 03/13/2015: 3.55, 2.17, 1.64 04/20/15: 4.52, 1.78, 2.54 06/01/2015: 4.82, 1.75, 2.75 07/20/2015: 8.71, 2.43, 3.58 09/14/2015: 9.17, 2.02, 4.55 10/26/2015: 11.4, 1.75, 6.42 12/07/2015: 1.54, 1.83, 8.44  24 hr urine PEP 10/13/2014: (-)  RADIOGRAPHIC STUDIES: None  ASSESSMENT: Shannon Rush 75 y.o. female with a history of Multiple Myeloma s/p autotransplant in August 2014 now on maintenence low dose Revlimid therapy  PLAN:   1. IgG kappa Multiple myeloma s/p Auto SCT, intermediate risk, relapsed  --MM markers demonstrate a very good response (M protein baseline 2.6, down to 0.18 after induction chemo and transplant, stable around 0.5 since July 2015, but has gradually increased lately, most recent 1.0 on 12/07/2015 -Her quantitative IgG and kappa light chain level has also slightly increased, will continue monitoring  - Acyclovir has been stopped after 1 year of SCT.   -Revlimid was held in April 2016 due to her pneumonia, and restarted it again, reduced to 2.5 mg 2 weeks on, one-week off -I reviewed her lab results with her, mild leukopenia, adequate for treatment.  -due to slightly increased M-protein level, I suggest her to follow up with Dr. [name] soon. Based on her M protein level, she has disease relapse, although her disease is more indolent, is only progressed slightly since a year ago.  -We do have many treatment options for relapsed multiple myeloma, my concern is mainly the risk of infection if we change her to therapeutic regimen.  -will continue low-dose Revlimid maintenance for now, and repeat her lab in 6 weeks.  2. Pancytopenia due to chemotherapy. -overall mild, no recent recurrent infection  except a cold and sinus infection lately   -Continue monitoring  3. Osteoporosis -recently repeated bone density scan showed worsening osteoporosis, continue zometa, changed to every 3 months after 03/2015.  -she declined repeating DEXA  -continue Zometa every 3 months  4. CAD, inferior STEMI in 02/2014 -continue plavix, metoprolol and lipitor  -follow up with Dr. Johnsie Cancel     Plan -Start next cycle Revlimid on 5/30, 2.49m 2 weeks on, one week off -Zometa infusion every 3 months -I suggest her to see Dr. GAlvie Heidelbergwithin a month for disease relapse  -return to clinic in 6 weeks with lab, or sooner if needed  All questions were answered. The patient knows to call the clinic with any problems, questions or concerns. We can certainly see the patient much sooner if necessary.  I spent 20 minutes counseling the patient face to face. The total time spent in the appointment was 25 minutes.   [name]  12/07/2015

## 2015-12-07 NOTE — Telephone Encounter (Signed)
per pof to sch pt appt-cld pt and gave appt time & date for 6/15 lab

## 2015-12-07 NOTE — Telephone Encounter (Signed)
per pof to sch pt appt-sent MW email to sch trmt-will call pt once reply °

## 2015-12-08 LAB — KAPPA/LAMBDA LIGHT CHAINS
Ig Kappa Free Light Chain: 154.34 mg/L — ABNORMAL HIGH (ref 3.30–19.40)
Ig Lambda Free Light Chain: 18.28 mg/L (ref 5.71–26.30)
KAPPA/LAMBDA FLC RATIO: 8.44 — AB (ref 0.26–1.65)

## 2015-12-12 LAB — MULTIPLE MYELOMA PANEL, SERUM
ALBUMIN SERPL ELPH-MCNC: 4 g/dL (ref 2.9–4.4)
Albumin/Glob SerPl: 1.2 (ref 0.7–1.7)
Alpha 1: 0.2 g/dL (ref 0.0–0.4)
Alpha2 Glob SerPl Elph-Mcnc: 0.6 g/dL (ref 0.4–1.0)
B-GLOBULIN SERPL ELPH-MCNC: 0.9 g/dL (ref 0.7–1.3)
Gamma Glob SerPl Elph-Mcnc: 1.8 g/dL (ref 0.4–1.8)
Globulin, Total: 3.5 g/dL (ref 2.2–3.9)
IgA, Qn, Serum: 115 mg/dL (ref 64–422)
IgM, Qn, Serum: 27 mg/dL (ref 26–217)
M PROTEIN SERPL ELPH-MCNC: 1 g/dL — AB
TOTAL PROTEIN: 7.5 g/dL (ref 6.0–8.5)

## 2015-12-13 ENCOUNTER — Telehealth: Payer: Self-pay | Admitting: *Deleted

## 2015-12-13 NOTE — Telephone Encounter (Signed)
Faxed Dr Ernestina Penna last OV note to Dr [name] with note stating pt ha MM relapse & requesting that she see her in next 2-3 wks per Dr Burr Medico.

## 2015-12-14 ENCOUNTER — Telehealth: Payer: Self-pay | Admitting: Hematology

## 2015-12-14 NOTE — Telephone Encounter (Signed)
pt called to cx 6.15 lab...pt did not want to r/s

## 2015-12-28 ENCOUNTER — Other Ambulatory Visit: Payer: Self-pay

## 2015-12-28 DIAGNOSIS — C9000 Multiple myeloma not having achieved remission: Secondary | ICD-10-CM | POA: Diagnosis not present

## 2015-12-28 DIAGNOSIS — C9002 Multiple myeloma in relapse: Secondary | ICD-10-CM | POA: Diagnosis not present

## 2015-12-29 ENCOUNTER — Telehealth: Payer: Self-pay | Admitting: *Deleted

## 2015-12-29 NOTE — Telephone Encounter (Signed)
Received call from pt this am stating that she has seen Dr Mady Gemma on tues & she suggested that she restart her Revlimid on Tues.  She has been on 2.5 mg daily x 14 days & off 7 days but wonders if Dr Burr Medico may want to change her back to 3 wks on & 1 wk off.  Message sent via email & EPIC/Inbasket to Dr Burr Medico. Informed pt's husband this pm that we have not received any messages yet from Dr Burr Medico but will hopefully get back with her on Monday.

## 2016-01-01 ENCOUNTER — Telehealth: Payer: Self-pay | Admitting: *Deleted

## 2016-01-01 NOTE — Telephone Encounter (Signed)
Pt called wanting to know if she should restart Revlimid after consultation with Dr. [name] at [facility] last week.   No notes found in Care Everywhere.  Spoke with Karalee Height @ Dr. Kendell Bane office and was informed that lab results along with office notes will not be available until possibly Wed 01/03/16.   Nicanor Bake phone numbers to desk nurse and fax number for notes to be faxed to our office for Dr. Benay Spice to review in Dr. Ernestina Penna absence. Spoke with husband and informed him of same above info.  Informed husband that pt will be contacted with Dr. Gearldine Shown instructions after reviewing [facility]'s office notes. Pt will be out of town from [place] through the weekend.   Both cell phones will not work out of town. Pt's  Out  Of  [employer]        [phone].

## 2016-01-02 NOTE — Telephone Encounter (Signed)
Voicemail from patient asking for a call as she has questions about message received yesterday.  This nurse called patient after review of following message.      Thu Simon Rhein, RN at 01/01/2016 1:01 PM     Status: Signed       Expand All Collapse All   Pt called wanting to know if she should restart Revlimid after consultation with Dr. [name] at [facility] last week. No notes found in Care Everywhere. Spoke with Karalee Height @ Dr. Kendell Bane office and was informed that lab results along with office notes will not be available until possibly Wed 01/03/16. Nicanor Bake phone numbers to desk nurse and fax number for notes to be faxed to our office for Dr. Benay Spice to review in Dr. Ernestina Penna absence. Spoke with husband and informed him of same above info. Informed husband that pt will be contacted with Dr. Gearldine Shown instructions after reviewing [facility]'s office notes. Pt will be out of town from [place] through the weekend. Both cell phones will not work out of town. Pt's Out Of [employer] [phone]       "I need to know what's going on.  I saw Dr. Samule Ohm last week.  I was to restart Revlimid TODAY.  Dr. [name] said it's up to Dr. Burr Medico if I need to resume Revlimid at two or three week intervals and rest for seven.  I called Friday and now this.  I've never had this happen before.  I'm leaving tomorrow morning to go out of town.  Lets just send the order for fourteen days and then if I need another week we'll take care of this then."  Will notify Dr. Ellamae Sia for Dr. Burr Medico of this request.

## 2016-01-03 ENCOUNTER — Telehealth: Payer: Self-pay | Admitting: *Deleted

## 2016-01-03 NOTE — Telephone Encounter (Signed)
Called pt on mobile # after speaking with Kathryne Eriksson @ Dr. Kendell Bane office & explained to pt that we still do not have Dr Kendell Bane OV note & Dr [name] is out of the office until next week & therefore we can not refill her Revlimid.  Assured her that we would discuss with Dr Burr Medico upon her return next week & she & Dr. [name] can talk & make a decision on the revlilmid.  Pt was concerned & asked if Dr Benay Spice or On call MD could just read her chart & order the revlimid.  Explained that they probably would not feel comfortable without having a note from Dr [name].  She also mentioned that there was a NP at [facility] & asked if she could help with this.  Informed her that I would at least try to find out but if she wasn't in the room with Dr [name], she probably wouldn't know what Dr [name] wanted either & most likely we would just have to wait till next week for answer.  Returned call to [facility] Mechele Claude is no longer in that department.  Will discuss with Dr Benay Spice today & Dr Burr Medico on her return next week.

## 2016-01-03 NOTE — Telephone Encounter (Signed)
Received message from [facility] @ [place] requesting refill of Revlimid.  Called [facility] and spoke with [place].  Informed [place] that office is still waiting for office notes from [place]  for our provider to review before Revlimid can be refilled.   [place] direct fax number to nurses' desk.  [place] voiced understanding and stated she would relay message to [place]. [facility]     Phone     [phone]     ;     Fax        [phone].

## 2016-01-08 ENCOUNTER — Other Ambulatory Visit: Payer: Self-pay | Admitting: Hematology

## 2016-01-08 ENCOUNTER — Other Ambulatory Visit: Payer: Self-pay | Admitting: *Deleted

## 2016-01-08 DIAGNOSIS — C9000 Multiple myeloma not having achieved remission: Secondary | ICD-10-CM

## 2016-01-08 MED ORDER — LENALIDOMIDE 2.5 MG PO CAPS: 2.5000 mg | ORAL_CAPSULE | Freq: Every day | ORAL | Status: DC

## 2016-01-08 NOTE — Telephone Encounter (Signed)
Dr Burr Medico talked with Shannon Rush after receiving Dr Bunnie Philips OV note today.

## 2016-01-08 NOTE — Telephone Encounter (Signed)
I called pt back. I have reviewed Dr Kendell Bane office note from 6/15. Pt prefers to have staging work up here, I will schedule her bone marrow biopsy, lab and bone survey in the next 1-2 weeks, and see her back in 3 weeks.   I sent a prescription of Revlimid 2.5 mg daily for 21 days to her specialty pharmacy today, she will start as soon as she receives it. She voiced good understanding about the planning, and agrees to follow.   [name]  01/08/2016

## 2016-01-09 ENCOUNTER — Encounter: Payer: Self-pay | Admitting: Family Medicine

## 2016-01-09 ENCOUNTER — Ambulatory Visit (INDEPENDENT_AMBULATORY_CARE_PROVIDER_SITE_OTHER): Payer: Medicare Other | Admitting: Family Medicine

## 2016-01-09 ENCOUNTER — Telehealth: Payer: Self-pay | Admitting: Hematology

## 2016-01-09 VITALS — BP 119/75 | HR 91 | Temp 97.7°F | Ht 67.0 in | Wt 123.8 lb

## 2016-01-09 DIAGNOSIS — H6592 Unspecified nonsuppurative otitis media, left ear: Secondary | ICD-10-CM

## 2016-01-09 NOTE — Patient Instructions (Signed)
Thank you for coming in today!  You have fluid in your left ear, though I'm not sure why. Try using afrin for no more than 3 days to see if this helps. You can also try zyrtec or claritin, but stay away from benadryl as this may cause significant drowsiness.   If no better in 6 - 12 weeks, please return.   Our clinic's number is [phone]. Feel free to call any time with questions or concerns. We will answer any questions after hours with our 24-hour emergency line at that number as well.   - Dr. Bonner Puna

## 2016-01-09 NOTE — Progress Notes (Signed)
Subjective: Shannon Rush is a 75 y.o. female with a history of multiple myeloma s/p stem cell transplant presenting for ear pain.   She repots 3 weeks of constant left ear fullness associated with decreased hearing. Denies tinnitus, dizziness, cold-like or allergic symptoms, fever, ear discharge or pain. Does not clean ears. Has tried vinegar into the ear without improvement, and po antihistamine x1 without improvement. No history of similar problem.   - ROS: As above, otherwise feels well. Rode her bike 25 minutes here today.  - Non-smoker  Objective: BP 119/75 mmHg  Pulse 91  Temp(Src) 97.7 F (36.5 C) (Oral)  Ht 5' 7"  (1.702 m)  Wt 123 lb 12.8 oz (56.155 kg)  BMI 19.39 kg/m2 Gen: Well-appearing 75 y.o. female in no distress HEENT: Normocephalic, conjunctivae normal, PERL, R ear canal and TM wnl. Left ear canal with nonobstructive pedunculated cysts anterior/superiorly x2. Mild excoriation posteriorly without drainage or erythema. Canal otherwise wnl. Clear effusion present. Nares normal, posterior oropharynx clear.  Assessment/Plan: Shannon Rush is a 75 y.o. female here for left middle ear effusion.  Uncertain cause - no URI or allergic symptoms. Given relatively acute time course, will treat with topical decongestants (recommended afrin for no more than 3 days), valsalva maneuver intermittently, and to return if not improved in 6 to 12 weeks with consideration for imaging/further work up of etiology at that time.

## 2016-01-09 NOTE — Telephone Encounter (Signed)
per pof to sch pt appt-cld * left pt a message of time & date of r/s appt per Dr Renae Gloss 7/19 @9 :12

## 2016-01-18 ENCOUNTER — Ambulatory Visit (HOSPITAL_COMMUNITY)
Admission: RE | Admit: 2016-01-18 | Discharge: 2016-01-18 | Disposition: A | Discharge status: Home / Self Care | Payer: Medicare Other | Source: Ambulatory Visit | Attending: Hematology | Admitting: Hematology

## 2016-01-18 ENCOUNTER — Other Ambulatory Visit: Payer: Self-pay | Admitting: Hematology

## 2016-01-18 ENCOUNTER — Ambulatory Visit: Payer: Self-pay | Admitting: Hematology

## 2016-01-18 ENCOUNTER — Telehealth: Payer: Self-pay | Admitting: *Deleted

## 2016-01-18 ENCOUNTER — Ambulatory Visit: Payer: Self-pay

## 2016-01-18 ENCOUNTER — Other Ambulatory Visit: Payer: Self-pay

## 2016-01-18 DIAGNOSIS — R938 Abnormal findings on diagnostic imaging of other specified body structures: Secondary | ICD-10-CM | POA: Insufficient documentation

## 2016-01-18 DIAGNOSIS — C9000 Multiple myeloma not having achieved remission: Secondary | ICD-10-CM

## 2016-01-18 DIAGNOSIS — M5134 Other intervertebral disc degeneration, thoracic region: Secondary | ICD-10-CM | POA: Insufficient documentation

## 2016-01-18 DIAGNOSIS — M5136 Other intervertebral disc degeneration, lumbar region: Secondary | ICD-10-CM | POA: Insufficient documentation

## 2016-01-18 DIAGNOSIS — M5030 Other cervical disc degeneration, unspecified cervical region: Secondary | ICD-10-CM | POA: Insufficient documentation

## 2016-01-18 NOTE — Telephone Encounter (Signed)
Pt called left message re:  Dr. [name] at [facility] would like for pt to do 3 tests -   Bone Survey,  Bone Marrow  Biopsy, and  The third test which pt could not remember.   Stated that pt still has not heard about BM Bx yet.  Pt also stated she had appt with Dr. [name]  On 02/01/16 .   Pt wanted to know if it is necessary to keep appt with Dr. Burr Medico on  01/31/16. Pt's   Phone    [phone].

## 2016-01-19 ENCOUNTER — Other Ambulatory Visit: Payer: Self-pay | Admitting: *Deleted

## 2016-01-19 ENCOUNTER — Telehealth: Payer: Self-pay | Admitting: *Deleted

## 2016-01-19 NOTE — Telephone Encounter (Signed)
Spoke with husband  and informed him  that :  Per Dr. Burr Medico,  Pt's  appts for 7/19 will be recheduled to  7/27 -  After appt with Dr. [name] on 02/01/16.   Fritz Pickerel voiced understanding and stated he would relay message to pt.  POF sent to scheduler.

## 2016-01-22 ENCOUNTER — Telehealth: Payer: Self-pay | Admitting: Hematology

## 2016-01-22 ENCOUNTER — Telehealth: Payer: Self-pay | Admitting: *Deleted

## 2016-01-22 NOTE — Telephone Encounter (Signed)
pt cld to r/s appt-gave pt r/s time & date °

## 2016-01-22 NOTE — Telephone Encounter (Signed)
cld pt and left message of time & date of appt for 7/26-no openings in inf or Dr Burr Medico sch-adv pt appt 7/26@12 :30

## 2016-01-22 NOTE — Telephone Encounter (Signed)
Myrtle, please inform IR and let pt know that will be fine.  [name]  01/22/2016

## 2016-01-22 NOTE — Telephone Encounter (Signed)
Patient called and left message that she does not want "conscious sedation" for her bone marrow bx (scheduled for 7/12).    She would like those orders removed, if possible.  She does not think she needs it and she has a 9:30am appt. That day and she does not want to be delayed for that appt.  Will let Dr. Burr Medico know.

## 2016-01-23 ENCOUNTER — Other Ambulatory Visit: Payer: Self-pay | Admitting: Radiology

## 2016-01-23 ENCOUNTER — Other Ambulatory Visit: Payer: Self-pay | Admitting: General Surgery

## 2016-01-23 NOTE — Telephone Encounter (Signed)
Called radiology & pt has already called & talked with CT & was told that she wouldn't be out by 9:30 am even if no conscious sedation.  Talked with pt & she will try to get to hosp early & see if there is any chance they can get to her sooner.  If not, she will just go for her class later.

## 2016-01-24 ENCOUNTER — Ambulatory Visit (HOSPITAL_COMMUNITY)
Admission: RE | Admit: 2016-01-24 | Discharge: 2016-01-24 | Disposition: A | Discharge status: Home / Self Care | Payer: Medicare Other | Source: Ambulatory Visit | Attending: Hematology | Admitting: Hematology

## 2016-01-24 ENCOUNTER — Encounter (HOSPITAL_COMMUNITY): Payer: Self-pay

## 2016-01-24 DIAGNOSIS — C9000 Multiple myeloma not having achieved remission: Secondary | ICD-10-CM | POA: Insufficient documentation

## 2016-01-24 DIAGNOSIS — D539 Nutritional anemia, unspecified: Secondary | ICD-10-CM | POA: Insufficient documentation

## 2016-01-24 DIAGNOSIS — D4989 Neoplasm of unspecified behavior of other specified sites: Secondary | ICD-10-CM | POA: Diagnosis not present

## 2016-01-24 DIAGNOSIS — C9002 Multiple myeloma in relapse: Secondary | ICD-10-CM | POA: Diagnosis not present

## 2016-01-24 DIAGNOSIS — R918 Other nonspecific abnormal finding of lung field: Secondary | ICD-10-CM | POA: Insufficient documentation

## 2016-01-24 LAB — CBC
HCT: 33.1 % — ABNORMAL LOW (ref 36.0–46.0)
Hemoglobin: 11.1 g/dL — ABNORMAL LOW (ref 12.0–15.0)
MCH: 34.7 pg — ABNORMAL HIGH (ref 26.0–34.0)
MCHC: 33.5 g/dL (ref 30.0–36.0)
MCV: 103.4 fL — ABNORMAL HIGH (ref 78.0–100.0)
PLATELETS: 155 10*3/uL (ref 150–400)
RBC: 3.2 MIL/uL — AB (ref 3.87–5.11)
RDW: 14.9 % (ref 11.5–15.5)
WBC: 4 10*3/uL (ref 4.0–10.5)

## 2016-01-24 LAB — APTT: APTT: 28 s (ref 24–37)

## 2016-01-24 LAB — BONE MARROW EXAM

## 2016-01-24 LAB — PROTIME-INR
INR: 1.07 (ref 0.00–1.49)
PROTHROMBIN TIME: 13.6 s (ref 11.6–15.2)

## 2016-01-24 MED ORDER — SODIUM CHLORIDE 0.9 % IV SOLN: INTRAVENOUS | Status: DC

## 2016-01-24 NOTE — Discharge Instructions (Signed)
Bone Marrow Aspiration and Bone Marrow Biopsy, Care After Refer to this sheet in the next few weeks. These instructions provide you with information about caring for yourself after your procedure. Your health care provider may also give you more specific instructions. Your treatment has been planned according to current medical practices, but problems sometimes occur. Call your health care provider if you have any problems or questions after your procedure. WHAT TO EXPECT AFTER THE PROCEDURE After your procedure, it is common to have:  Soreness or tenderness around the puncture site.  Bruising. HOME CARE INSTRUCTIONS  Take medicines only as directed by your health care provider.  Follow your health care provider's instructions about:  Puncture site care.  Bandage (dressing) changes and removal.  Bathe and shower as directed by your health care provider.  Check your puncture site every day for signs of infection. Watch for:  Redness, swelling, or pain.  Fluid, blood, or pus.  Return to your normal activities as directed by your health care provider.  Keep all follow-up visits as directed by your health care provider. This is important. SEEK MEDICAL CARE IF:  You have a fever.  You have uncontrollable bleeding.  You have redness, swelling, or pain at the site of your puncture.  You have fluid, blood, or pus coming from your puncture site.   This information is not intended to replace advice given to you by your health care provider. Make sure you discuss any questions you have with your health care provider.   Document Released: 01/18/2005 Document Revised: 11/15/2014 Document Reviewed: 06/22/2014 Elsevier Interactive Patient Education [employer].

## 2016-01-24 NOTE — Progress Notes (Signed)
Patient ID: Shannon Rush, female   DOB: [birth year], 75 y.o.   MRN: [id number] Pt presents today for CT guided BM biopsy to rule out relapsing multiple myeloma. She does not wish to have IV conscious sedation. Details/risks of procedure, incl but not limited to, internal bleeding, infection d/w pt with her understanding and consent. Labs checked.

## 2016-01-24 NOTE — Procedures (Signed)
Successful RT ILIAC BM ASP AND CORE BX No comp Stable Path pending Full report in PACS

## 2016-01-31 ENCOUNTER — Ambulatory Visit: Payer: Self-pay | Admitting: Hematology

## 2016-01-31 ENCOUNTER — Other Ambulatory Visit: Payer: Self-pay

## 2016-01-31 ENCOUNTER — Ambulatory Visit: Payer: Self-pay

## 2016-01-31 LAB — TISSUE HYBRIDIZATION (BONE MARROW)-NCBH

## 2016-01-31 LAB — CHROMOSOME ANALYSIS, BONE MARROW

## 2016-02-01 DIAGNOSIS — Z8579 Personal history of other malignant neoplasms of lymphoid, hematopoietic and related tissues: Secondary | ICD-10-CM | POA: Diagnosis not present

## 2016-02-06 ENCOUNTER — Other Ambulatory Visit: Payer: Self-pay

## 2016-02-06 ENCOUNTER — Other Ambulatory Visit: Payer: Self-pay | Admitting: *Deleted

## 2016-02-06 DIAGNOSIS — C9000 Multiple myeloma not having achieved remission: Secondary | ICD-10-CM

## 2016-02-06 MED ORDER — LENALIDOMIDE 2.5 MG PO CAPS: 2.5000 mg | ORAL_CAPSULE | Freq: Every day | ORAL | 0 refills | Status: DC

## 2016-02-07 ENCOUNTER — Ambulatory Visit (HOSPITAL_BASED_OUTPATIENT_CLINIC_OR_DEPARTMENT_OTHER): Payer: Medicare Other | Admitting: Hematology

## 2016-02-07 ENCOUNTER — Other Ambulatory Visit (HOSPITAL_BASED_OUTPATIENT_CLINIC_OR_DEPARTMENT_OTHER): Payer: Medicare Other

## 2016-02-07 ENCOUNTER — Ambulatory Visit (HOSPITAL_BASED_OUTPATIENT_CLINIC_OR_DEPARTMENT_OTHER): Payer: Medicare Other

## 2016-02-07 VITALS — BP 115/58 | HR 76 | Temp 98.2°F | Resp 18 | Ht 67.0 in | Wt 124.4 lb

## 2016-02-07 DIAGNOSIS — C9000 Multiple myeloma not having achieved remission: Secondary | ICD-10-CM

## 2016-02-07 DIAGNOSIS — I252 Old myocardial infarction: Secondary | ICD-10-CM

## 2016-02-07 DIAGNOSIS — M810 Age-related osteoporosis without current pathological fracture: Secondary | ICD-10-CM | POA: Diagnosis not present

## 2016-02-07 DIAGNOSIS — D61818 Other pancytopenia: Secondary | ICD-10-CM

## 2016-02-07 DIAGNOSIS — C9002 Multiple myeloma in relapse: Secondary | ICD-10-CM

## 2016-02-07 LAB — CBC WITH DIFFERENTIAL/PLATELET
BASO%: 2.3 % — AB (ref 0.0–2.0)
Basophils Absolute: 0.1 10*3/uL (ref 0.0–0.1)
EOS%: 2.7 % (ref 0.0–7.0)
Eosinophils Absolute: 0.1 10*3/uL (ref 0.0–0.5)
HCT: 29.2 % — ABNORMAL LOW (ref 34.8–46.6)
HEMOGLOBIN: 9.9 g/dL — AB (ref 11.6–15.9)
LYMPH%: 24.9 % (ref 14.0–49.7)
MCH: 34.5 pg — ABNORMAL HIGH (ref 25.1–34.0)
MCHC: 33.9 g/dL (ref 31.5–36.0)
MCV: 101.7 fL — AB (ref 79.5–101.0)
MONO#: 0.4 10*3/uL (ref 0.1–0.9)
MONO%: 14 % (ref 0.0–14.0)
NEUT%: 56.1 % (ref 38.4–76.8)
NEUTROS ABS: 1.4 10*3/uL — AB (ref 1.5–6.5)
Platelets: 158 10*3/uL (ref 145–400)
RBC: 2.87 10*6/uL — AB (ref 3.70–5.45)
RDW: 15.1 % — AB (ref 11.2–14.5)
WBC: 2.6 10*3/uL — ABNORMAL LOW (ref 3.9–10.3)
lymph#: 0.6 10*3/uL — ABNORMAL LOW (ref 0.9–3.3)

## 2016-02-07 LAB — COMPREHENSIVE METABOLIC PANEL
ALT: 15 U/L (ref 0–55)
AST: 23 U/L (ref 5–34)
Albumin: 3.3 g/dL — ABNORMAL LOW (ref 3.5–5.0)
Alkaline Phosphatase: 72 U/L (ref 40–150)
Anion Gap: 7 mEq/L (ref 3–11)
BILIRUBIN TOTAL: 0.78 mg/dL (ref 0.20–1.20)
BUN: 20.8 mg/dL (ref 7.0–26.0)
CO2: 22 meq/L (ref 22–29)
Calcium: 9 mg/dL (ref 8.4–10.4)
Chloride: 110 mEq/L — ABNORMAL HIGH (ref 98–109)
Creatinine: 0.9 mg/dL (ref 0.6–1.1)
EGFR: 61 mL/min/{1.73_m2} — AB (ref >90)
GLUCOSE: 150 mg/dL — AB (ref 70–140)
Potassium: 4.1 mEq/L (ref 3.5–5.1)
SODIUM: 139 meq/L (ref 136–145)
TOTAL PROTEIN: 7.2 g/dL (ref 6.4–8.3)

## 2016-02-07 MED ORDER — SODIUM CHLORIDE 0.9 % IV SOLN
Freq: Once | INTRAVENOUS | Status: AC
  Administered 2016-02-07: 15:00:00 via INTRAVENOUS

## 2016-02-07 MED ORDER — DEXAMETHASONE 4 MG PO TABS: 4.0000 mg | ORAL_TABLET | ORAL | 4 refills | Status: DC

## 2016-02-07 MED ORDER — ZOLEDRONIC ACID 4 MG/5ML IV CONC
3.5000 mg | Freq: Once | INTRAVENOUS | Status: AC
  Administered 2016-02-07: 3.5 mg via INTRAVENOUS
  Filled 2016-02-07: qty 4.38

## 2016-02-07 MED ORDER — POMALIDOMIDE 4 MG PO CAPS
4.0000 mg | ORAL_CAPSULE | Freq: Every day | ORAL | 2 refills | Status: DC
Start: 2016-02-07 — End: 2016-04-18

## 2016-02-07 NOTE — Progress Notes (Signed)
Faxed new script for Pomalyst with authorization   #   [phone]      02/07/16  To   [facility].   Called pt and left message requesting pt to call [employer] to take pt survey for script to be effective.

## 2016-02-07 NOTE — Patient Instructions (Signed)
Zoledronic Acid injection (Paget's Disease, Osteoporosis) °What is this medicine? °ZOLEDRONIC ACID (ZOE le dron ik AS id) lowers the amount of calcium loss from bone. It is used to treat Paget's disease and osteoporosis in women. °This medicine may be used for other purposes; ask your health care provider or pharmacist if you have questions. °What should I tell my health care provider before I take this medicine? °They need to know if you have any of these conditions: °-aspirin-sensitive asthma °-cancer, especially if you are receiving medicines used to treat cancer °-dental disease or wear dentures °-infection °-kidney disease °-low levels of calcium in the blood °-past surgery on the parathyroid gland or intestines °-receiving corticosteroids like dexamethasone or prednisone °-an unusual or allergic reaction to zoledronic acid, other medicines, foods, dyes, or preservatives °-pregnant or trying to get pregnant °-breast-feeding °How should I use this medicine? °This medicine is for infusion into a vein. It is given by a health care professional in a hospital or clinic setting. °Talk to your pediatrician regarding the use of this medicine in children. This medicine is not approved for use in children. °Overdosage: If you think you have taken too much of this medicine contact a poison control center or emergency room at once. °NOTE: This medicine is only for you. Do not share this medicine with others. °What if I miss a dose? °It is important not to miss your dose. Call your doctor or health care professional if you are unable to keep an appointment. °What may interact with this medicine? °-certain antibiotics given by injection °-NSAIDs, medicines for pain and inflammation, like ibuprofen or naproxen °-some diuretics like bumetanide, furosemide °-teriparatide °This list may not describe all possible interactions. Give your health care provider a list of all the medicines, herbs, non-prescription drugs, or dietary  supplements you use. Also tell them if you smoke, drink alcohol, or use illegal drugs. Some items may interact with your medicine. °What should I watch for while using this medicine? °Visit your doctor or health care professional for regular checkups. It may be some time before you see the benefit from this medicine. Do not stop taking your medicine unless your doctor tells you to. Your doctor may order blood tests or other tests to see how you are doing. °Women should inform their doctor if they wish to become pregnant or think they might be pregnant. There is a potential for serious side effects to an unborn child. Talk to your health care professional or pharmacist for more information. °You should make sure that you get enough calcium and vitamin D while you are taking this medicine. Discuss the foods you eat and the vitamins you take with your health care professional. °Some people who take this medicine have severe bone, joint, and/or muscle pain. This medicine may also increase your risk for jaw problems or a broken thigh bone. Tell your doctor right away if you have severe pain in your jaw, bones, joints, or muscles. Tell your doctor if you have any pain that does not go away or that gets worse. °Tell your dentist and dental surgeon that you are taking this medicine. You should not have major dental surgery while on this medicine. See your dentist to have a dental exam and fix any dental problems before starting this medicine. Take good care of your teeth while on this medicine. Make sure you see your dentist for regular follow-up appointments. °What side effects may I notice from receiving this medicine? °Side effects that you should   report to your doctor or health care professional as soon as possible: °-allergic reactions like skin rash, itching or hives, swelling of the face, lips, or tongue °-anxiety, confusion, or depression °-breathing problems °-changes in vision °-eye pain °-feeling faint or  lightheaded, falls °-jaw pain, especially after dental work °-mouth sores °-muscle cramps, stiffness, or weakness °-redness, blistering, peeling or loosening of the skin, including inside the mouth °-trouble passing urine or change in the amount of urine °Side effects that usually do not require medical attention (report to your doctor or health care professional if they continue or are bothersome): °-bone, joint, or muscle pain °-constipation °-diarrhea °-fever °-hair loss °-irritation at site where injected °-loss of appetite °-nausea, vomiting °-stomach upset °-trouble sleeping °-trouble swallowing °-weak or tired °This list may not describe all possible side effects. Call your doctor for medical advice about side effects. You may report side effects to FDA at 1-800-FDA-1088. °Where should I keep my medicine? °This drug is given in a hospital or clinic and will not be stored at home. °NOTE: This sheet is a summary. It may not cover all possible information. If you have questions about this medicine, talk to your doctor, pharmacist, or health care provider. °  °© 2016, [employer]. (2013-11-27 14:19:57) ° °

## 2016-02-07 NOTE — Progress Notes (Signed)
[place], MD [place] [address]  DIAGNOSIS: Multiple myeloma not having achieved remission ([place])  Osteoporosis  History of ST elevation myocardial infarction (STEMI)  Pancytopenia ([place])  CHIEF COMPLAINS: Follow-up of multiple myeloma  CURRENT TREATMENT:  Zometa 3.5 mg monthly started on 06/2013, on every 3 months now;  Revlimid 5 mg daily for 21 days then off for 7 days for maintenance for at least 2 years started on 09/02/2013.  Decreased to Revlimid 2.5 mg daily for 21 days then off for 7 days on 12/30/2013, and changed to 2.74m daily for 14 days then off for 7 days on 12/15/2014 due to multiple infection.      Multiple myeloma ([place]   05/26/2012 Initial Diagnosis    Presenting IgG was 4,040 mg/dL on 06/05/2012 (IgA 35; IgM 34); kappa free light chain 34.4 mg/dL, lambda 0.00, kappa:lambda ratio of 34.75; SPEP with M-spike of 2.60.     06/30/2012 Imaging    Numerous lytic lesions throughout the calvarium.  Lytic lesion within the left lateral scapula.  Findings most compatible with metastases or myeloma. Multiple mid and lower thoracic compression fractures.  Slight compression through the endplates at L3.     138/25/0539Bone Marrow Biopsy    Bone marrow biopsy showed 49% plasma cell. Normal classical cytogenetics; however, myeloma FISH panel showed 13q- (intermediate risk)         07/14/2012 - 07/27/2012 Radiation Therapy    Palliative radiation 20 Gy over 10 fractions between to thoracic spine cord compression and symptomatic left scapula lytic lesion.     08/10/2012 -  Chemotherapy    Started SQ Velcade once weekly, 3 weeks on, 1 weeks off; daily Revlimid d1-21, 7 days off; and Dexamethasone 74mPO weekly.      02/12/2013 Bone Marrow Transplant    [facility]     06/14/2013 Tumor Marker    IgG  1830,  Kappa:lamba ratio, 1.69 (baseline was 34.75 or   M-spike  0.28 (baseline of 2.6 or  89.3% of baseline).       06/25/2013 -  Chemotherapy    Started zometa 3.5 mg monthly      09/01/2013 -  Chemotherapy    Maintenance therapy with revlimid 74m40maily for 14 days and then off for 7 (decreased from 21 days on and 7 days off based on neutropenia).      09/13/2013 - 09/17/2014 Hospital Admission    Hospital admission for pneumonia     12/20/2013 Treatment Plan Change    Maintenance therapy decreased to 2.5 mg daily for 21 days and then off for 7 days based on low counts.      01/25/2014 Tumor Marker    IgG 1360 M spike 0.5     03/01/2014 - 03/05/2014 Hospital Ad[facility], received thrombolytic therapy, cath showed 60% prox RCA, mid-distal RCA 50%, Echo normal, no stents.     04/11/2014 - 08/07/2014 Chemotherapy    Continue Revlimid at 2.5 mg 3 weeks on 1 week off     08/08/2014 - 08/13/2014 Hospital Admission    Hospital admission for pneumonia. Her Revlimid was held.     08/29/2014 - 09/14/2014 Chemotherapy    Maintenance Revlimid restarted     09/14/2014 - 09/17/2014 Hospital Admission    [place] for pneumonia [place] Revlimid was held again.     11/13/2014 - 12/25/2015 Chemotherapy    Maintenance Revlimid restarted, changed to 2.74m daily, 2 weks on, 1 week off from 12/15/2014, stopped due to disease progression      INTERVAL HISTORY:  Shannon CENTANNI75y.o. female with a past medical history of MM as detailed above, s/p SCT is here for follow-up.   She is doing well overall, no new changes since her last visit. She underwent a bone marrow biopsy a few weeks ago, and was seen by Dr. GAlvie Heidelberglast week.   MEDICAL HISTORY: Past Medical History:  Diagnosis Date  . CAD (coronary artery disease)    [place] [facility] [facility] [place] [facility] [place]  pRCA 60%, mid to dist RCA 50% >>> med rx  . Cord compression [place] 07/[place]12    MRI- diffuse myeloma involvement of T-L spine  . History of radiation therapy 07/13/12-07/27/12   spinal cord compression T3-T10,left scapula  . Hx of echocardiogram    [facility]  Normal LVEF without significant regional wall motion abnormalities. Mild   . Multiple myeloma ([place] 07/01/2012  . OSTEOPOROSIS 06/11/2010   Multiple compression fractures; and spontaneous fracture of sternum Qualifier: Diagnosis of  By: SZebedee IbaNP, SManuela Schwartz    . Thoracic kyphosis 07/13/12   per MRI scan  . Unspecified deficiency anemia      ALLERGIES:  is allergic to zosyn [piperacillin sod-tazobactam so]; piperacillin-tazobactam in dex; zithromax [azithromycin]; and ciprofloxacin.  MEDICATIONS: has a current medication list which includes the following prescription(s): acetaminophen, atorvastatin, calcium carbonate, clopidogrel, lenalidomide, metoprolol succinate, morphine, nitroglycerin, and proair hfa.  SURGICAL HISTORY:  Past Surgical History:  Procedure Laterality Date  . APPENDECTOMY    . CESAREAN SECTION     x2   . COLONOSCOPY  2007   neg with Dr. MWatt Climes . ELBOW SURGERY    . HIP SURGERY  2009   left  . TUBAL LIGATION      REVIEW OF SYSTEMS:   Constitutional: Denies abnormal weight loss, (+) fatigue Eyes: Denies blurriness of vision Ears, nose, mouth, throat, and face: Denies mucositis or sore throat, (+) left tooth pain  Respiratory: Denies cough, dyspnea or wheezes Cardiovascular: Denies palpitation, chest discomfort or lower extremity swelling Gastrointestinal:  Denies nausea, heartburn or change in bowel habits Skin: Denies abnormal skin rashes Lymphatics: Denies new lymphadenopathy or easy bruising Neurological:Denies numbness, tingling or new weaknesses Behavioral/Psych: Mood is stable, no new changes  All other systems were reviewed with the patient and are negative.  PHYSICAL EXAMINATION: ECOG PERFORMANCE STATUS: 0 Blood pressure  (!) 115/58, pulse 76, temperature 98.2 F (36.8 C), temperature source Oral, resp. rate 18, height 5' 7" (1.702 m), weight 124 lb 6.4 oz (56.4 kg), SpO2 99 %. GENERAL:alert, no distress and comfortable; easily mobile to exam table; kyphosis, moderate.  SKIN: skin color, texture, turgor are normal, no rashes or significant lesions; Abrasion well healed on RLE.  EYES: normal, Conjunctiva are pink, 1 tear/injectionof blood vessel left eye in sclera. OROPHARYNX:no exudate, no erythema and lips, buccal mucosa, and tongue normal, no ONJ NECK: supple, thyroid normal size, non-tender, without nodularity; nodules cervical bilaterally.  LYMPH:  no palpable lymphadenopathy in the cervical, axillary or supraclavicular LUNGS: crackles at the bases bilaterally with normal breathing effort, no wheezes or rhonchi HEART: regular rate & rhythm and no murmurs and no lower extremity edema ABDOMEN:abdomen soft, non-tender and normal  bowel sounds Musculoskeletal:no cyanosis of digits and no clubbing, (+) spine scoliosis and mild tenderness  NEURO: alert & oriented x 3 with fluent speech, no focal motor/sensory deficits  Labs:  CBC Latest Ref Rng & Units 02/07/2016 01/24/2016 12/07/2015  WBC 3.9 - 10.3 10e3/uL 2.6(L) 4.0 3.1(L)  Hemoglobin 11.6 - 15.9 g/dL 9.9(L) 11.1(L) 12.1  Hematocrit 34.8 - 46.6 % 29.2(L) 33.1(L) 35.8  Platelets 145 - 400 10e3/uL 158 155 173    CMP Latest Ref Rng & Units 02/07/2016 12/07/2015 12/07/2015  Glucose 70 - 140 mg/dl 150(H) 76 -  BUN 7.0 - 26.0 mg/dL 20.8 15.3 -  Creatinine 0.6 - 1.1 mg/dL 0.9 0.9 -  Sodium 136 - 145 mEq/L 139 139 -  Potassium 3.5 - 5.1 mEq/L 4.1 3.8 -  Chloride 96 - 112 mEq/L - - -  CO2 22 - 29 mEq/L 22 25 -  Calcium 8.4 - 10.4 mg/dL 9.0 9.6 -  Total Protein 6.4 - 8.3 g/dL 7.2 8.1 7.5  Total Bilirubin 0.20 - 1.20 mg/dL 0.78 0.87 -  Alkaline Phos 40 - 150 U/L 72 59 -  AST 5 - 34 U/L 23 25 -  ALT 0 - 55 U/L 15 19 -   SPEP M-PROTEIN 10/01/2013: 0.18 (nadir)   10/13/2014: 0.6 11/21/2014: 0.5 01/30/2015: 0.6 03/13/2015: 0.6 04/20/2015: 0.5 06/01/2015: 0.5 07/20/2015: 0.7 09/14/2015: 0.7 10/26/2015: 0.8 12/07/2015: 1.0  IgG (609 501 1061) 10/13/2014: 1710 11/21/2014: 1460 01/30/2015: 1380 03/13/15: 1570 04/20/2015: 1550 06/01/2015: 1590 07/22/2015: 1770 09/14/2015: 1787 10/26/2015: 1898 12/07/2015: 2045   Serum kappa, lambda light chains, and ratio  11/21/2014: 3.95, 2.08, 1.90  01/30/2015: 3.92, 2.64, 1.48 03/13/2015: 3.55, 2.17, 1.64 04/20/15: 4.52, 1.78, 2.54 06/01/2015: 4.82, 1.75, 2.75 07/20/2015: 8.71, 2.43, 3.58 09/14/2015: 9.17, 2.02, 4.55 10/26/2015: 11.4, 1.75, 6.42 12/07/2015: 1.54, 1.83, 8.44  24 hr urine PEP 10/13/2014: (-)  PATHOLOGY REPORT  Diagnosis 01/24/2016 Bone Marrow, Aspirate,Biopsy, and Clot, RT iliac and core - VARIABLY CELLULAR BONE MARROW WITH PLASMA CELL NEOPLASM. - SEE COMMENT. PERIPHERAL BLOOD: - MACROCYTIC ANEMIA. Diagnosis Note The bone marrow is variably cellular with trilineage hematopoiesis and non specific myeloid changes. In this background, the plasma cell component is variable ranging from less than 10 % in many areas to 30% in some aspirate particles with relatively low cellularity. Foci of interstitial infiltrates and small clusters are also seen in the clot section. Immunohistochemical stains show kappa light chain restriction in atypical plasma cell clusters and infiltrates consistent with plasma cell neoplasm. Correlation with cytogenetic and FISH studies is recommended. (BNS:gt, 01-29-16)   RADIOGRAPHIC STUDIES: None  ASSESSMENT: AMRI LIEN 75 y.o. female with a history of Multiple Myeloma s/p autotransplant in August 2014 now on maintenence low dose Revlimid therapy  PLAN:   1. IgG kappa Multiple myeloma s/p Auto SCT, intermediate risk, relapsed in 01/2016 --MM markers demonstrate a very good response (M protein baseline 2.6, down to 0.18 after induction chemo and transplant, stable around 0.5 since July 2015,  but has gradually increased lately, most recent 1.0 on 12/07/2015) -I discussed her repeated a bone marrow biopsy from 02/01/2016, which showed 10-30% plasma cells in her marrow -I agree with Dr. Kendell Bane recommendation to stop revlimid and change to Daratumumab, Pomalidomide and dexamethasone due to her disease relapse. I don't think she has resistance to Revlimid, she has been on very small dose of Revlimid due to her prior history of multiple infections. --Chemotherapy consent: Side effects including but does not not limited to, fatigue, infusion reaction, nausea, vomiting, diarrhea, neuropathy,  renal and kidney dysfunction, neutropenic fever, high risk of infection, needed for blood transfusion, bleeding, were discussed with patient in great detail. She agrees to proceed. -she has a trip planned for 8/6 to 8/20, she will start pomalidomide 90m daily for 3 weeks every 28 days on 8/7, and dexa 449mevery week on 8/14  -we plan to start Daratumumab when she returns from her trip on 8/24. We plan to give it every week for 8 weeks then every 2 weeks for 4 month then monthly. Daratumumab may interfere her blood type and cross in the future, I'll obtain a baseline blood type and cross before she starts. It may also interfere her measurement IgG M-protein  -Neutropenic fever precautions and signs of infection were discussed with her again in details. Given her prior multiple pneumonia, I'll have low threshold to start her on prophylactic antibiotics if she develops neutropenia.  2. Pancytopenia due to chemotherapy. -overall mild, no recent recurrent infection except a cold and sinus infection lately   -Continue monitoring  3. Osteoporosis -recently repeated bone density scan showed worsening osteoporosis, continue zometa, changed to every 3 months after 03/2015.  -she declined repeating DEXA  -continue Zometa every 3 months  4. CAD, inferior STEMI in 02/2014 -continue plavix, metoprolol and lipitor   -follow up with Dr. NiJohnsie Cancel   Plan -Stop Revlimid  -She will start pomalidomide 67m267maily for 3 weeks every 4 weeks on 8/7 -she will start dexa 84m80mery week (with pomalidomide) on 8/14 -she will start daratumumab on 8/24  -Zometa infusion every 3 months, treatment today  -I will see her back on 8/24   All questions were answered. The patient knows to call the clinic with any problems, questions or concerns. We can certainly see the patient much sooner if necessary.  I spent 30 minutes counseling the patient face to face. The total time spent in the appointment was 40 minutes.   FengTruitt Merle26/2017

## 2016-02-08 ENCOUNTER — Ambulatory Visit: Payer: Self-pay

## 2016-02-08 ENCOUNTER — Encounter: Payer: Self-pay | Admitting: Hematology

## 2016-02-08 ENCOUNTER — Encounter (HOSPITAL_COMMUNITY): Payer: Self-pay

## 2016-02-08 LAB — KAPPA/LAMBDA LIGHT CHAINS
IG LAMBDA FREE LIGHT CHAIN: 14.2 mg/L (ref 5.7–26.3)
Ig Kappa Free Light Chain: 192.7 mg/L — ABNORMAL HIGH (ref 3.3–19.4)
Kappa/Lambda FluidC Ratio: 13.57 — ABNORMAL HIGH (ref 0.26–1.65)

## 2016-02-09 ENCOUNTER — Telehealth: Payer: Self-pay | Admitting: *Deleted

## 2016-02-09 ENCOUNTER — Telehealth: Payer: Self-pay | Admitting: Hematology

## 2016-02-09 NOTE — Telephone Encounter (Signed)
cld and spoke topt and adv of time & date of appt for 8/24

## 2016-02-09 NOTE — Telephone Encounter (Signed)
Per staff phone call and POF I have schedueld appts. Scheduler advised of appts.  JMW  

## 2016-02-12 ENCOUNTER — Telehealth: Payer: Self-pay | Admitting: *Deleted

## 2016-02-12 DIAGNOSIS — C9000 Multiple myeloma not having achieved remission: Secondary | ICD-10-CM | POA: Diagnosis not present

## 2016-02-12 LAB — MULTIPLE MYELOMA PANEL, SERUM
ALPHA2 GLOB SERPL ELPH-MCNC: 0.6 g/dL (ref 0.4–1.0)
Albumin SerPl Elph-Mcnc: 3.3 g/dL (ref 2.9–4.4)
Albumin/Glob SerPl: 1.1 (ref 0.7–1.7)
Alpha 1: 0.2 g/dL (ref 0.0–0.4)
B-Globulin SerPl Elph-Mcnc: 0.7 g/dL (ref 0.7–1.3)
Gamma Glob SerPl Elph-Mcnc: 1.8 g/dL (ref 0.4–1.8)
Globulin, Total: 3.3 g/dL (ref 2.2–3.9)
IGM (IMMUNOGLOBIN M), SRM: 20 mg/dL — AB (ref 26–217)
IgA, Qn, Serum: 87 mg/dL (ref 64–422)
M Protein SerPl Elph-Mcnc: 1.1 g/dL — ABNORMAL HIGH
TOTAL PROTEIN: 6.6 g/dL (ref 6.0–8.5)

## 2016-02-12 NOTE — Telephone Encounter (Signed)
Pt called Saturday at 231 pm, and left message re:  Pt had collected 24 hour urine sample, and would like to know if she could bring sample in to the lab.   Called pt today 02/12/16 and left message on voice mail informing pt that the clinic was closed on the weekend.  Left instructions that as long as pt collected urine and kept on ice or in the refrigirator, pt can bring sample in to the lab. Pt's   Phone   [phone].

## 2016-02-14 ENCOUNTER — Encounter: Payer: Self-pay | Admitting: Pharmacist

## 2016-02-14 NOTE — Progress Notes (Signed)
Oral Chemotherapy Pharmacist Encounter   Received prescription for Pomalyst 4mg  to be taken in conjunction with daratumumab and dexamethasone.  Labs reviewed, no DDI's noted except possibility for increased CNS depression with Pomalyst and use of opioids (MSIR). Pt will be monitored closely for fatigue and AMS.  Prior authorization process initiated with [employer] at [phone], transferred to Specialty line. Pending PA# [id number], may take 48hours to get determination.  Prescription form for [facility] filled out and faxed to [phone], received notification of successful transmission.  Johny Drilling, PharmD, BCPS Oral Chemotherapy Clinic

## 2016-02-15 ENCOUNTER — Ambulatory Visit (INDEPENDENT_AMBULATORY_CARE_PROVIDER_SITE_OTHER): Payer: Medicare Other | Admitting: Family Medicine

## 2016-02-15 ENCOUNTER — Encounter: Payer: Self-pay | Admitting: Family Medicine

## 2016-02-15 VITALS — BP 115/79 | HR 101 | Temp 98.5°F | Wt 122.6 lb

## 2016-02-15 DIAGNOSIS — H9202 Otalgia, left ear: Secondary | ICD-10-CM

## 2016-02-15 LAB — UPEP/UIFE/LIGHT CHAINS/TP, 24-HR UR
% BETA, Urine: 21 %
ALBUMIN, U: 16.6 %
ALPHA 1 URINE: 4.5 %
ALPHA-2-GLOBULIN, U: 9.3 %
FREE LAMBDA LT CHAINS, UR: 4.52 mg/L (ref 0.24–6.66)
Free Kappa Lt Chains,Ur: 534 mg/L — ABNORMAL HIGH (ref 1.35–24.19)
GAMMA GLOBULIN URINE: 48.6 %
KAPPA/LAMBDA RATIO, U: 118.14 — AB (ref 2.04–10.37)
M-SPIKE, %: 25.8 % — AB
M-SPIKE, MG/24 HR: 68 mg/(24.h) — AB
PROTEIN UR: 18.3 mg/dL
Prot,24hr calculated: 265 mg/24 hr — ABNORMAL HIGH (ref 30–150)

## 2016-02-15 MED ORDER — FLUTICASONE PROPIONATE 50 MCG/ACT NA SUSP: 2.0000 | Freq: Every day | NASAL | 1 refills | Status: DC

## 2016-02-15 NOTE — Patient Instructions (Signed)
Thank you so much for coming to visit today! I have sent a prescription for Flonase to help with the congestion and hopefully help drain the fluid in your ear. I have placed a referral to ENT. They should contact you with an appointment, but please try to see them as soon as possible. Please let us know if there is anything else we can do for you!  Dr. Gerlean Ren

## 2016-02-15 NOTE — Progress Notes (Signed)
Subjective:     Patient ID: Shannon Rush, female   DOB: 10/15/1940, 75 y.o.   MRN: [id number]  HPI Shannon Rush is a 75yo female presenting for ear pain. - Previously seen on 01/09/16 for 3week history of constant left ear fullness with decreased hearing. Exam with left ear canal with nonobstructive pedunculated cysts anteriorly and superiorly and clear effusion. Topical decongestant recommended (Afrin x3 days). - Continues to note left ear pain, now for greater than 3 months - Constant - Reports muffled hearing in left ear - Feels like left ear is "full of junk" - Last night she noted severe pain in her left ear. Has not noticed pain in her ear prior. Relieved by Tylenol. - Denies drainage from ear - Denies fever, nausea, vomiting, diarrhea, constipation, headache, blurred vision, changes in taste - Reports some mild congestion - Has used Afrin intermittently, never for greater than 3 days - Has tried white vinegar without relief - Has never been seen by ENT  - Nonsmoker - Daughter OB attending at [employer]  Review of Systems Per HPI. Other systems negative.    Objective:   Physical Exam  Constitutional: She appears well-developed and well-nourished. No distress.  HENT:  Mouth/Throat: Oropharynx is clear and moist.  Right ear normal. Left ear with two pedunculated lesions at 7:00 and a scab at 5-6:00; mild effusion. Weber with localization to left ear. Rinne difficult with testing in right ear affected by loud noise in left ear, making her unable to differentiate sound for air vs. Bone conduction on right.  Eyes:  Subconjunctival hemorrhage noted in right eye at 5:00  Cardiovascular: Normal rate and regular rhythm.   No murmur heard. Pulmonary/Chest: Effort normal. No respiratory distress. She has no wheezes.  Lymphadenopathy:    She has no cervical adenopathy.  Skin: No rash noted.  Psychiatric: She has a normal mood and affect. Her behavior is normal.        Assessment and Plan:     Left ear pain - Concerning given now chronic left ear pain and abnormal exam and hearing. - Flonase given reports of congestion and mild effusion of left ear - Referral to ENT - To return following ENT or if symptoms continue to worsen

## 2016-02-18 DIAGNOSIS — H9202 Otalgia, left ear: Secondary | ICD-10-CM | POA: Insufficient documentation

## 2016-02-18 NOTE — Assessment & Plan Note (Signed)
-   Concerning given now chronic left ear pain and abnormal exam and hearing. - Flonase given reports of congestion and mild effusion of left ear - Referral to ENT - To return following ENT or if symptoms continue to worsen

## 2016-02-20 ENCOUNTER — Ambulatory Visit: Payer: Self-pay | Admitting: Hematology

## 2016-02-23 ENCOUNTER — Encounter: Payer: Self-pay | Admitting: Pharmacist

## 2016-02-23 NOTE — Progress Notes (Signed)
Oral Chemotherapy Pharmacist Encounter   Received notification from [employer] that prior authorization request for Pomalyst had been denied. Reason for denial was lack of failure or intolerance to Velcade. Pt had been on Velcade in 2014. Appeal application completed and faxed 8/10.  Received notification from [employer] [facility] 8/11 am that appeal had been approved [id number]).  Called [facility] to re-try Pomalyst prescription. Pomalyst prescription approved however copay >$700  [facility] will begin looking for patient assistance for co-pay and reach out to the patient for any additional information needed in that process.  Oral Chemo Clinic will continue to follow.  Johny Drilling, PharmD, BCPS Oral Chemotherapy Clinic

## 2016-03-01 ENCOUNTER — Telehealth: Payer: Self-pay | Admitting: *Deleted

## 2016-03-01 NOTE — Telephone Encounter (Signed)
-----   Message from [name], MD sent at 02/29/2016  2:03 PM EDT ----- Domenica Reamer call her back, yes she should continue pomalyst, and start dexa once a week this week. We will see her back on 8/24 as it scheduled  Krista Blue  ----- Message ----- From: Tomasa Rand, RN Sent: 02/29/2016  10:55 AM To: [name], MD  Patient called to say she started taking Pomalyst this past Tuesday, and will continue taking it on the schedule set by Dr. Burr Medico.  Please call patient to confirm this is correct.

## 2016-03-01 NOTE — Telephone Encounter (Signed)
Called pt & left message to cont pomolyst & take dex once weekly & keep appt for next week.

## 2016-03-05 DIAGNOSIS — H9012 Conductive hearing loss, unilateral, left ear, with unrestricted hearing on the contralateral side: Secondary | ICD-10-CM | POA: Diagnosis not present

## 2016-03-05 DIAGNOSIS — H90A32 Mixed conductive and sensorineural hearing loss, unilateral, left ear with restricted hearing on the contralateral side: Secondary | ICD-10-CM | POA: Diagnosis not present

## 2016-03-05 DIAGNOSIS — H918X1 Other specified hearing loss, right ear: Secondary | ICD-10-CM | POA: Diagnosis not present

## 2016-03-05 DIAGNOSIS — H6522 Chronic serous otitis media, left ear: Secondary | ICD-10-CM | POA: Diagnosis not present

## 2016-03-06 ENCOUNTER — Telehealth: Payer: Self-pay | Admitting: Pharmacist

## 2016-03-06 ENCOUNTER — Other Ambulatory Visit: Payer: Self-pay | Admitting: Cardiovascular Disease

## 2016-03-06 ENCOUNTER — Encounter: Payer: Self-pay | Admitting: Pharmacist

## 2016-03-06 DIAGNOSIS — H01022 Squamous blepharitis right lower eyelid: Secondary | ICD-10-CM | POA: Diagnosis not present

## 2016-03-06 DIAGNOSIS — H4423 Degenerative myopia, bilateral: Secondary | ICD-10-CM | POA: Diagnosis not present

## 2016-03-06 DIAGNOSIS — H01024 Squamous blepharitis left upper eyelid: Secondary | ICD-10-CM | POA: Diagnosis not present

## 2016-03-06 DIAGNOSIS — H01021 Squamous blepharitis right upper eyelid: Secondary | ICD-10-CM | POA: Diagnosis not present

## 2016-03-06 DIAGNOSIS — H01025 Squamous blepharitis left lower eyelid: Secondary | ICD-10-CM | POA: Diagnosis not present

## 2016-03-06 NOTE — Telephone Encounter (Signed)
Oral Chemotherapy Pharmacist Encounter Attempted to reach patient to provide new oral chemotherapy education for Pomalyst. No answer. Left VM for patient to call back with any questions or issues.   Thank you,  Nuala Alpha, PharmD Oral Chemotherapy Clinic [phone]

## 2016-03-07 ENCOUNTER — Other Ambulatory Visit (HOSPITAL_BASED_OUTPATIENT_CLINIC_OR_DEPARTMENT_OTHER): Payer: Medicare Other

## 2016-03-07 ENCOUNTER — Ambulatory Visit (HOSPITAL_COMMUNITY)
Admission: RE | Admit: 2016-03-07 | Discharge: 2016-03-07 | Disposition: A | Discharge status: Home / Self Care | Payer: Medicare Other | Source: Ambulatory Visit | Attending: Hematology | Admitting: Hematology

## 2016-03-07 ENCOUNTER — Encounter: Payer: Self-pay | Admitting: Hematology

## 2016-03-07 ENCOUNTER — Ambulatory Visit (HOSPITAL_BASED_OUTPATIENT_CLINIC_OR_DEPARTMENT_OTHER): Payer: Medicare Other

## 2016-03-07 ENCOUNTER — Ambulatory Visit (HOSPITAL_BASED_OUTPATIENT_CLINIC_OR_DEPARTMENT_OTHER): Payer: Medicare Other | Admitting: Hematology

## 2016-03-07 ENCOUNTER — Encounter: Payer: Self-pay | Admitting: Pharmacist

## 2016-03-07 ENCOUNTER — Other Ambulatory Visit: Payer: Self-pay | Admitting: *Deleted

## 2016-03-07 VITALS — BP 116/66 | HR 89 | Temp 98.2°F | Resp 18

## 2016-03-07 VITALS — BP 135/73 | HR 70 | Temp 97.8°F | Resp 18 | Ht 67.0 in | Wt 124.6 lb

## 2016-03-07 DIAGNOSIS — I252 Old myocardial infarction: Secondary | ICD-10-CM

## 2016-03-07 DIAGNOSIS — C9000 Multiple myeloma not having achieved remission: Secondary | ICD-10-CM

## 2016-03-07 DIAGNOSIS — R05 Cough: Secondary | ICD-10-CM | POA: Diagnosis not present

## 2016-03-07 DIAGNOSIS — Z9484 Stem cells transplant status: Secondary | ICD-10-CM

## 2016-03-07 DIAGNOSIS — C9002 Multiple myeloma in relapse: Secondary | ICD-10-CM | POA: Diagnosis not present

## 2016-03-07 DIAGNOSIS — D61818 Other pancytopenia: Secondary | ICD-10-CM | POA: Diagnosis not present

## 2016-03-07 DIAGNOSIS — Z5112 Encounter for antineoplastic immunotherapy: Secondary | ICD-10-CM

## 2016-03-07 DIAGNOSIS — M810 Age-related osteoporosis without current pathological fracture: Secondary | ICD-10-CM

## 2016-03-07 LAB — TYPE AND SCREEN
ABO/RH(D): A POS
ANTIBODY SCREEN: NEGATIVE

## 2016-03-07 LAB — COMPREHENSIVE METABOLIC PANEL
ALBUMIN: 3.1 g/dL — AB (ref 3.5–5.0)
ALK PHOS: 81 U/L (ref 40–150)
ALT: 27 U/L (ref 0–55)
ANION GAP: 7 meq/L (ref 3–11)
AST: 31 U/L (ref 5–34)
BUN: 26.8 mg/dL — AB (ref 7.0–26.0)
CALCIUM: 9.1 mg/dL (ref 8.4–10.4)
CO2: 24 mEq/L (ref 22–29)
Chloride: 107 mEq/L (ref 98–109)
Creatinine: 0.9 mg/dL (ref 0.6–1.1)
EGFR: 67 mL/min/{1.73_m2} — AB (ref >90)
Glucose: 72 mg/dl (ref 70–140)
POTASSIUM: 3.7 meq/L (ref 3.5–5.1)
Sodium: 139 mEq/L (ref 136–145)
Total Bilirubin: 0.53 mg/dL (ref 0.20–1.20)
Total Protein: 7.5 g/dL (ref 6.4–8.3)

## 2016-03-07 LAB — CBC WITH DIFFERENTIAL/PLATELET
BASO%: 0.2 % (ref 0.0–2.0)
BASOS ABS: 0 10*3/uL (ref 0.0–0.1)
EOS%: 0.4 % (ref 0.0–7.0)
Eosinophils Absolute: 0 10*3/uL (ref 0.0–0.5)
HEMATOCRIT: 28.7 % — AB (ref 34.8–46.6)
HEMOGLOBIN: 9.5 g/dL — AB (ref 11.6–15.9)
LYMPH#: 0.5 10*3/uL — AB (ref 0.9–3.3)
LYMPH%: 8.3 % — ABNORMAL LOW (ref 14.0–49.7)
MCH: 34.8 pg — AB (ref 25.1–34.0)
MCHC: 33.2 g/dL (ref 31.5–36.0)
MCV: 104.8 fL — AB (ref 79.5–101.0)
MONO#: 0.8 10*3/uL (ref 0.1–0.9)
MONO%: 12.5 % (ref 0.0–14.0)
NEUT#: 5 10*3/uL (ref 1.5–6.5)
NEUT%: 78.6 % — AB (ref 38.4–76.8)
Platelets: 155 10*3/uL (ref 145–400)
RBC: 2.74 10*6/uL — ABNORMAL LOW (ref 3.70–5.45)
RDW: 15.4 % — AB (ref 11.2–14.5)
WBC: 6.4 10*3/uL (ref 3.9–10.3)

## 2016-03-07 MED ORDER — LEVOFLOXACIN 750 MG PO TABS: 750.0000 mg | ORAL_TABLET | Freq: Every day | ORAL | 0 refills | Status: DC

## 2016-03-07 MED ORDER — SODIUM CHLORIDE 0.9 % IV SOLN
Freq: Once | INTRAVENOUS | Status: AC
  Administered 2016-03-07: 11:00:00 via INTRAVENOUS

## 2016-03-07 MED ORDER — PROCHLORPERAZINE MALEATE 10 MG PO TABS
ORAL_TABLET | ORAL | Status: AC
  Filled 2016-03-07: qty 1

## 2016-03-07 MED ORDER — ACETAMINOPHEN 325 MG PO TABS
650.0000 mg | ORAL_TABLET | Freq: Once | ORAL | Status: AC
  Administered 2016-03-07: 650 mg via ORAL

## 2016-03-07 MED ORDER — DIPHENHYDRAMINE HCL 25 MG PO CAPS
ORAL_CAPSULE | ORAL | Status: AC
  Filled 2016-03-07: qty 2

## 2016-03-07 MED ORDER — PROCHLORPERAZINE MALEATE 10 MG PO TABS
10.0000 mg | ORAL_TABLET | Freq: Once | ORAL | Status: AC
  Administered 2016-03-07: 10 mg via ORAL

## 2016-03-07 MED ORDER — DARATUMUMAB CHEMO INJECTION 400 MG/20ML
16.0000 mg/kg | Freq: Once | INTRAVENOUS | Status: DC
  Filled 2016-03-07: qty 45

## 2016-03-07 MED ORDER — METHYLPREDNISOLONE SODIUM SUCC 125 MG IJ SOLR
125.0000 mg | Freq: Once | INTRAMUSCULAR | Status: AC
  Administered 2016-03-07: 125 mg via INTRAVENOUS

## 2016-03-07 MED ORDER — PROCHLORPERAZINE MALEATE 10 MG PO TABS: 10.0000 mg | ORAL_TABLET | Freq: Four times a day (QID) | ORAL | 1 refills | Status: DC | PRN

## 2016-03-07 MED ORDER — ACETAMINOPHEN 325 MG PO TABS
ORAL_TABLET | ORAL | Status: AC
  Filled 2016-03-07: qty 2

## 2016-03-07 MED ORDER — ACYCLOVIR 400 MG PO TABS: 400.0000 mg | ORAL_TABLET | Freq: Two times a day (BID) | ORAL | 11 refills | Status: DC

## 2016-03-07 MED ORDER — MONTELUKAST SODIUM 10 MG PO TABS
10.0000 mg | ORAL_TABLET | Freq: Once | ORAL | Status: AC
  Administered 2016-03-07: 10 mg via ORAL
  Filled 2016-03-07: qty 1

## 2016-03-07 MED ORDER — SODIUM CHLORIDE 0.9 % IV SOLN
500.0000 mg | Freq: Once | INTRAVENOUS | Status: AC
  Administered 2016-03-07: 500 mg via INTRAVENOUS
  Filled 2016-03-07: qty 25

## 2016-03-07 MED ORDER — METHYLPREDNISOLONE SODIUM SUCC 125 MG IJ SOLR
INTRAMUSCULAR | Status: AC
  Filled 2016-03-07: qty 2

## 2016-03-07 MED ORDER — DIPHENHYDRAMINE HCL 25 MG PO CAPS
50.0000 mg | ORAL_CAPSULE | Freq: Once | ORAL | Status: AC
  Administered 2016-03-07: 50 mg via ORAL

## 2016-03-07 MED ORDER — ONDANSETRON HCL 8 MG PO TABS: 8.0000 mg | ORAL_TABLET | Freq: Two times a day (BID) | ORAL | 1 refills | Status: DC | PRN

## 2016-03-07 NOTE — Telephone Encounter (Signed)
Please advise on refill request as the patient does not have a recent lipid panel in epic and per last office visit, lipids are followed by primary. Thanks, MI

## 2016-03-07 NOTE — Patient Instructions (Signed)
[facility] Discharge Instructions for Patients Receiving Chemotherapy  Today you received the following chemotherapy agents: daratumumab  To help prevent nausea and vomiting after your treatment, we encourage you to take your nausea medication.  Take it as often as prescribed.     If you develop nausea and vomiting that is not controlled by your nausea medication, call the clinic. If it is after clinic hours your family physician or the after hours number for the clinic or go to the Emergency Department.   BELOW ARE SYMPTOMS THAT SHOULD BE REPORTED IMMEDIATELY:  *FEVER GREATER THAN 100.5 F  *CHILLS WITH OR WITHOUT FEVER  NAUSEA AND VOMITING THAT IS NOT CONTROLLED WITH YOUR NAUSEA MEDICATION  *UNUSUAL SHORTNESS OF BREATH  *UNUSUAL BRUISING OR BLEEDING  TENDERNESS IN MOUTH AND THROAT WITH OR WITHOUT PRESENCE OF ULCERS  *URINARY PROBLEMS  *BOWEL PROBLEMS  UNUSUAL RASH Items with * indicate a potential emergency and should be followed up as soon as possible.  Feel free to call the clinic you have any questions or concerns. The clinic phone number is [phone].   I have been informed and understand all the instructions given to me. I know to contact the clinic, my physician, or go to the Emergency Department if any problems should occur. I do not have any questions at this time, but understand that I may call the clinic during office hours   should I have any questions or need assistance in obtaining follow up care.    __________________________________________  _____________  __________ Signature of Patient or Authorized Representative            Date                   Time    __________________________________________ Nurse's Signature    Daratumumab injection What is this medicine? DARATUMUMAB (dar a toom ue mab) is a monoclonal antibody. It is used to treat multiple myeloma. This medicine may be used for other purposes; ask your health care  provider or pharmacist if you have questions. What should I tell my health care provider before I take this medicine? They need to know if you have any of these conditions: -infection (especially a virus infection such as chickenpox, cold sores, or herpes) -lung or breathing disease -pregnant or trying to get pregnant -breast-feeding -an unusual or allergic reaction to daratumumab, other medicines, foods, dyes, or preservatives How should I use this medicine? This medicine is for infusion into a vein. It is given by a health care professional in a hospital or clinic setting. Talk to your pediatrician regarding the use of this medicine in children. Special care may be needed. Overdosage: If you think you have taken too much of this medicine contact a poison control center or emergency room at once. NOTE: This medicine is only for you. Do not share this medicine with others. What if I miss a dose? Keep appointments for follow-up doses as directed. It is important not to miss your dose. Call your doctor or health care professional if you are unable to keep an appointment. What may interact with this medicine? Interactions have not been studied. Give your health care provider a list of all the medicines, herbs, non-prescription drugs, or dietary supplements you use. Also tell them if you smoke, drink alcohol, or use illegal drugs. Some items may interact with your medicine. This list may not describe all possible interactions. Give your health care provider a list of all the medicines, herbs,  non-prescription drugs, or dietary supplements you use. Also tell them if you smoke, drink alcohol, or use illegal drugs. Some items may interact with your medicine. What should I watch for while using this medicine? This drug may make you feel generally unwell. Report any side effects. Continue your course of treatment even though you feel ill unless your doctor tells you to stop. This medicine can cause  serious allergic reactions. To reduce your risk you may need to take medicine before treatment with this medicine. Take your medicine as directed. This medicine can affect the results of blood tests to match your blood type. These changes can last for up to 6 months after the final dose. Your healthcare provider will do blood tests to match your blood type before you start treatment. Tell all of your healthcare providers that you are being treated with this medicine before receiving a blood transfusion. This medicine can affect the results of some tests used to determine treatment response; extra tests may be needed to evaluate response. Do not become pregnant while taking this medicine or for 3 months after stopping it. Women should inform their doctor if they wish to become pregnant or think they might be pregnant. There is a potential for serious side effects to an unborn child. Talk to your health care professional or pharmacist for more information. What side effects may I notice from receiving this medicine? Side effects that you should report to your doctor or health care professional as soon as possible: -allergic reactions like skin rash, itching or hives, swelling of the face, lips, or tongue -breathing problems -chills -cough -dizziness -feeling faint or lightheaded -headache -nausea, vomiting -shortness of breath Side effects that usually do not require medical attention (Report these to your doctor or health care professional if they continue or are bothersome.): -back pain -fever -joint pain -loss of appetite -tiredness This list may not describe all possible side effects. Call your doctor for medical advice about side effects. You may report side effects to FDA at 1-800-FDA-1088. Where should I keep my medicine? Keep out of the reach of children. This drug is given in a hospital or clinic and will not be stored at home. NOTE: This sheet is a summary. It may not cover all  possible information. If you have questions about this medicine, talk to your doctor, pharmacist, or health care provider.    2016, Elsevier/Gold Standard. (2014-08-30 17:02:23)

## 2016-03-07 NOTE — Progress Notes (Signed)
Oral Chemotherapy Pharmacist Encounter  I spoke with patient for overview of new oral chemotherapy medication: Pomalyst.  Original start date: approx 02/27/16  Filled by [facility] in [place], [place].  Copay ~$700.  Pt states the copay is affordable.  I encouraged her to reach out to her pharmacy to see if they have copay assistance programs if the copay becomes an issue.  Provided re-enforcement drug info to patient on administration, dosing, side effects, safe handling, and monitoring. She voiced understanding and appreciation.   Pt saw Dr. Burr Medico today prior to arriving to the infusion room where we spoke. Adverse effects so far: Itching - has subsided but did keep pt awake at night.  She did not take any medications/creams to help w/ the itching.  I cannot identify that this is a side effect of Pomalyst.  Pt states no rash associated w/ the itching.  [place] up to her knees.  She said after that the itching went away and has not returned. SOB - common. Pt aware this could be coming from her anemia.  I alerted her to s/sxs of PE.  She is aware that Pomalyst may cause VTE. LE edema - common.  Pt states her legs felt "full" last [place].  Pt again is aware of s/sxs of DVT.  She is currently not on ASA.  She takes Plavix.  Fatigue - common.  She feels better after taking the steroids.  All questions answered.  We will follow up in 1-2 weeks for adherence and toxicity management.   Thank you, Kennith Center, Pharm.D., CPP 03/07/2016@11 :54 AM Oral Chemotherapy Clinic

## 2016-03-07 NOTE — Progress Notes (Signed)
[name], MD [place] [address]  DIAGNOSIS: Multiple myeloma with relapse  CHIEF COMPLAINS: Follow-up of multiple myeloma  CURRENT TREATMENT:   1. Pomalidomide 4 mg daily on day 1-21, dexamethasone 40 mg on day 1, 8 and 15, every 28 days, started on 02/27/2016, daratumumab weekly started on 03/07/2016 2. Zometa 3.5 mg monthly started on 06/2013, on every 3 months now      Multiple myeloma ([place])   05/26/2012 Initial Diagnosis    Presenting IgG was 4,040 mg/dL on 06/05/2012 (IgA 35; IgM 34); kappa free light chain 34.4 mg/dL, lambda 0.00, kappa:lambda ratio of 34.75; SPEP with M-spike of 2.60.      06/30/2012 Imaging    Numerous lytic lesions throughout the calvarium.  Lytic lesion within the left lateral scapula.  Findings most compatible with metastases or myeloma. Multiple mid and lower thoracic compression fractures.  Slight compression through the endplates at L3.      26/37/8588 Bone Marrow Biopsy    Bone marrow biopsy showed 49% plasma cell. Normal classical cytogenetics; however, myeloma FISH panel showed 13q- (intermediate risk)          07/14/2012 - 07/27/2012 Radiation Therapy    Palliative radiation 20 Gy over 10 fractions between to thoracic spine cord compression and symptomatic left scapula lytic lesion.      08/10/2012 -  Chemotherapy    Started SQ Velcade once weekly, 3 weeks on, 1 weeks off; daily Revlimid d1-21, 7 days off; and Dexamethasone 87m PO weekly.       02/12/2013 Bone Marrow Transplant    Auto bone marrow transplant at [facility]       06/14/2013 Tumor Marker    IgG  1830,  Kappa:lamba ratio, 1.69 (baseline was 34.75 or   M-spike 0.28 (baseline of 2.6 or  89.3% of baseline).        06/25/2013 -  Chemotherapy    Started zometa 3.5 mg monthly       09/01/2013 -  Chemotherapy    Maintenance therapy with revlimid 551mdaily for 14 days and then off for 7  (decreased from 21 days on and 7 days off based on neutropenia).       09/13/2013 - 09/17/2014 Hospital Admission    Hospital admission for pneumonia      12/20/2013 Treatment Plan Change    Maintenance therapy decreased to 2.5 mg daily for 21 days and then off for 7 days based on low counts.       01/25/2014 Tumor Marker    IgG 1360 M spike 0.5      03/01/2014 - 03/05/2014 Hospital Admission    [facility] received thrombolytic therapy, cath showed 60% prox RCA, mid-distal RCA 50%, Echo normal, no stents.      04/11/2014 - 08/07/2014 Chemotherapy    Continue Revlimid at 2.5 mg 3 weeks on 1 week off      08/08/2014 - 08/13/2014 Hospital Admission    Hospital admission for pneumonia. Her Revlimid was held.      08/29/2014 - 09/14/2014 Chemotherapy    Maintenance Revlimid restarted      09/14/2014 - 09/17/2014 Hospital Admission    [place]      11/13/2014 - 12/25/2015 Chemotherapy    Maintenance Revlimid restarted, changed to 2.31m51maily, 2 weks on, 1 week  off from 12/15/2014, stopped due to disease progression       01/24/2016 Progression    Patient is M protein has gradually increased to 1.0g, repeated a bone marrow biopsy showed plasma cell 10-30%      02/27/2016 -  Chemotherapy    Pomalidomide 4 mg daily on day 1-21, dexamethasone 40 mg on day 1, 8 and 15, every 28 days, started on 02/27/2016, daratumumab weekly started on 03/07/2016      INTERVAL HISTORY:  ANNESSA Rush 75 y.o. female with a past medical history of MM as detailed above, s/p SCT is here for follow-up.   She started having nasal congestion and a productive cough about 2 weeks ago. She went to the [place] in [place] for vacation with her family about 2 weeks ago, and just returned 4 days ago. She denies any fever, chills, chest pain or dyspnea. Her husband also started mild  productive cough daily. He started pomalidomide last week, and dexa this week. She is scheduled to start daratumumab today.  MEDICAL HISTORY: Past Medical History:  Diagnosis Date  . CAD (coronary artery disease)    a. inf STEMI (in [place] >>> [facility]) >>> LHC (8/15- [place]):  pRCA 60%, mid to dist RCA 50% >>> med rx  . Cord compression ([place]) 07/13/12   MRI- diffuse myeloma involvement of T-L spine  . History of radiation therapy 07/13/12-07/27/12   spinal cord compression T3-T10,left scapula  . Hx of echocardiogram    Echo (03/02/14-done at [facility]):  Normal LVEF without significant regional wall motion abnormalities. Mild   . Multiple myeloma ([facility]) 07/01/2012  . OSTEOPOROSIS 06/11/2010   Multiple compression fractures; and spontaneous fracture of sternum Qualifier: Diagnosis of  By: [name], [name]     . Thoracic kyphosis 07/13/12   per MRI scan  . Unspecified deficiency anemia      ALLERGIES:  is allergic to zosyn [piperacillin sod-tazobactam so]; piperacillin-tazobactam in dex; zithromax [azithromycin]; and ciprofloxacin.  MEDICATIONS: has a current medication list which includes the following prescription(s): acetaminophen, acyclovir, atorvastatin, calcium carbonate, clopidogrel, dexamethasone, fluticasone, levofloxacin, metoprolol succinate, morphine, nitroglycerin, ondansetron, pomalidomide, proair hfa, and prochlorperazine.  SURGICAL HISTORY:  Past Surgical History:  Procedure Laterality Date  . APPENDECTOMY    . CESAREAN SECTION     x2   . COLONOSCOPY  2007   neg with Dr. [name]  . ELBOW SURGERY    . HIP SURGERY  2009   left  . TUBAL LIGATION      REVIEW OF SYSTEMS:   Constitutional: Denies abnormal weight loss, (+) fatigue Eyes: Denies blurriness of vision Ears, nose, mouth, throat, and face: Denies mucositis or sore throat, (+) left tooth pain  Respiratory: Denies cough, dyspnea or wheezes Cardiovascular: Denies  palpitation, chest discomfort or lower extremity swelling Gastrointestinal:  Denies nausea, heartburn or change in bowel habits Skin: Denies abnormal skin rashes Lymphatics: Denies new lymphadenopathy or easy bruising Neurological:Denies numbness, tingling or new weaknesses Behavioral/Psych: Mood is stable, no new changes  All other systems were reviewed with the patient and are negative.  PHYSICAL EXAMINATION: ECOG PERFORMANCE STATUS: 1 Blood pressure 135/73, pulse 70, temperature 97.8 F (36.6 C), temperature source Oral, resp. rate 18, height _0  (1.702 m), weight 124 lb 9.6 oz (56.5 kg), SpO2 97 %. GENERAL:alert, no distress and comfortable; easily mobile to exam table; kyphosis, moderate.  SKIN: skin color, texture, turgor are normal, no rashes or significant lesions; Abrasion well  healed on RLE.  EYES: normal, Conjunctiva are pink, 1 tear/injectionof blood vessel left eye in sclera. OROPHARYNX:no exudate, no erythema and lips, buccal mucosa, and tongue normal, no ONJ NECK: supple, thyroid normal size, non-tender, without nodularity; nodules cervical bilaterally.  LYMPH:  no palpable lymphadenopathy in the cervical, axillary or supraclavicular LUNGS: crackles at the bases bilaterally with normal breathing effort, no wheezes or rhonchi HEART: regular rate & rhythm and no murmurs and no lower extremity edema ABDOMEN:abdomen soft, non-tender and normal bowel sounds Musculoskeletal:no cyanosis of digits and no clubbing, (+) spine scoliosis and mild tenderness  NEURO: alert & oriented x 3 with fluent speech, no focal motor/sensory deficits  Labs:  CBC Latest Ref Rng & Units 03/07/2016 02/07/2016 01/24/2016  WBC 3.9 - 10.3 10e3/uL 6.4 2.6(L) 4.0  Hemoglobin 11.6 - 15.9 g/dL 9.5(L) 9.9(L) 11.1(L)  Hematocrit 34.8 - 46.6 % 28.7(L) 29.2(L) 33.1(L)  Platelets 145 - 400 10e3/uL 155 158 155    CMP Latest Ref Rng & Units 03/07/2016 02/07/2016 02/07/2016  Glucose 70 - 140 mg/dl 72 150(H) -  BUN  7.0 - 26.0 mg/dL 26.8(H) 20.8 -  Creatinine 0.6 - 1.1 mg/dL 0.9 0.9 -  Sodium 136 - 145 mEq/L 139 139 -  Potassium 3.5 - 5.1 mEq/L 3.7 4.1 -  Chloride 96 - 112 mEq/L - - -  CO2 22 - 29 mEq/L 24 22 -  Calcium 8.4 - 10.4 mg/dL 9.1 9.0 -  Total Protein 6.4 - 8.3 g/dL 7.5 7.2 6.6  Total Bilirubin 0.20 - 1.20 mg/dL 0.53 0.78 -  Alkaline Phos 40 - 150 U/L 81 72 -  AST 5 - 34 U/L 31 23 -  ALT 0 - 55 U/L 27 15 -   SPEP M-PROTEIN 10/01/2013: 0.18 (nadir)  10/13/2014: 0.6 11/21/2014: 0.5 01/30/2015: 0.6 03/13/2015: 0.6 04/20/2015: 0.5 06/01/2015: 0.5 07/20/2015: 0.7 09/14/2015: 0.7 10/26/2015: 0.8 12/07/2015: 1.0 02/07/2016: 1.1   IgG [phone]) 10/13/2014: 1710 11/21/2014: 1460 01/30/2015: 1380 03/13/15: 1570 04/20/2015: 1550 06/01/2015: 1590 07/22/2015: 1770 09/14/2015: 1787 10/26/2015: 1898 12/07/2015: 2045 02/07/2016: 1883  Serum kappa, lambda light chains, and ratio  11/21/2014: 3.95, 2.08, 1.90  01/30/2015: 3.92, 2.64, 1.48 03/13/2015: 3.55, 2.17, 1.64 04/20/15: 4.52, 1.78, 2.54 06/01/2015: 4.82, 1.75, 2.75 07/20/2015: 8.71, 2.43, 3.58 09/14/2015: 9.17, 2.02, 4.55 10/26/2015: 11.4, 1.75, 6.42 12/07/2015: 1.54, 1.83, 8.44 02/07/2016: 1.93, 1.42, 13.6  24 hr urine PEP 10/13/2014: (-)02/12/2016: 89m/24hr   PATHOLOGY REPORT  Diagnosis 01/24/2016 Bone Marrow, Aspirate,Biopsy, and Clot, RT iliac and core - VARIABLY CELLULAR BONE MARROW WITH PLASMA CELL NEOPLASM. - SEE COMMENT. PERIPHERAL BLOOD: - MACROCYTIC ANEMIA. Diagnosis Note The bone marrow is variably cellular with trilineage hematopoiesis and non specific myeloid changes. In this background, the plasma cell component is variable ranging from less than 10 % in many areas to 30% in some aspirate particles with relatively low cellularity. Foci of interstitial infiltrates and small clusters are also seen in the clot section. Immunohistochemical stains show kappa light chain restriction in atypical plasma cell clusters and infiltrates consistent with  plasma cell neoplasm. Correlation with cytogenetic and FISH studies is recommended. (BNS:gt, [id number]   RADIOGRAPHIC STUDIES: None  ASSESSMENT: Shannon STAHELI89y.o. female with a history of Multiple Myeloma s/p autotransplant in August 2014 now on maintenence low dose Revlimid therapy  PLAN:   1. IgG kappa Multiple myeloma s/p Auto SCT, intermediate risk, relapsed in 01/2016 --MM markers demonstrate a very good response (M protein baseline 2.6, down to 0.18 after induction chemo and transplant, stable around 0.5  since July 2015, but has gradually increased lately, most recent 1.0 on 12/07/2015) -I discussed her repeated a bone marrow biopsy from 02/01/2016, which showed 10-30% plasma cells in her marrow -I agree with Dr. Kendell Bane recommendation to stop revlimid and change to Daratumumab, Pomalidomide and dexamethasone due to her disease relapse. I don't think she has resistance to Revlimid, she has been on very small dose of Revlimid due to her prior history of multiple infections. -She has started pomalidomide and dexa last week, tolerating well so far  -we plan to start Daratumumab today. We plan to give it every week for 8 weeks then every 2 weeks for 4 month then monthly. Daratumumab may interfere her blood type and cross in the future, I'll obtain a baseline blood type and cross before she starts. It may also interfere her measurement IgG M-protein  -To prevent infusion reaction to the tumor, she will receive Solu-Medrol, Benadryl, Tylenol,etc as premeds, and I instructed her to take dexa 39m today and tomorrow to prevent delayed infusion reaction -She will start acyclovir for herpes prevention. I also called in Zofran and Compazine for her -Neutropenic fever precautions and signs of infection were discussed with her again in details. Given her prior multiple pneumonia, I'll have low threshold to start her on prophylactic antibiotics if she develops neutropenia. -Due to her productive  cough, I recommend her to have a course of antibiotics.  2. Productive cough, likely bronchitis -She has had productive cough for the past 2 weeks, before she started pomalidomide -Her white is stable, she is not hypoxic, white count is normal, no fever, clinically does not like pneumonia. Patient refused x-ray today -I'll call in Levaquin 750 mg daily for 10 days, she agrees to to take  3. Pancytopenia due to chemotherapy. -overall mild, no recent recurrent infection except a cold and sinus infection lately   -Continue monitoring  3. Osteoporosis -recently repeated bone density scan showed worsening osteoporosis, continue zometa, changed to every 3 months after 03/2015.  -she declined repeating DEXA  -continue Zometa every 3 months  4. CAD, inferior STEMI in 02/2014 -continue plavix, metoprolol and lipitor  -follow up with Dr. NJohnsie Cancel    Plan -Levaquin 750 mg daily for 10 days -She will continue pomalidomide 469mdaily for 3 weeks, last dose 9/4 -she will continue dexa 4032mvery week (with pomalidomide)  -she will start daratumumab today and continue weekly for 8 weeks  -I will see her back in one week    All questions were answered. The patient knows to call the clinic with any problems, questions or concerns. We can certainly see the patient much sooner if necessary.  I spent 30 minutes counseling the patient face to face. The total time spent in the appointment was 40 minutes.   FenTruitt Merle/24/2017

## 2016-03-08 ENCOUNTER — Ambulatory Visit (HOSPITAL_BASED_OUTPATIENT_CLINIC_OR_DEPARTMENT_OTHER): Payer: Medicare Other

## 2016-03-08 VITALS — BP 114/64 | HR 69 | Temp 98.2°F | Resp 18

## 2016-03-08 DIAGNOSIS — Z5112 Encounter for antineoplastic immunotherapy: Secondary | ICD-10-CM | POA: Diagnosis not present

## 2016-03-08 DIAGNOSIS — C9000 Multiple myeloma not having achieved remission: Secondary | ICD-10-CM

## 2016-03-08 DIAGNOSIS — C9002 Multiple myeloma in relapse: Secondary | ICD-10-CM | POA: Diagnosis not present

## 2016-03-08 LAB — KAPPA/LAMBDA LIGHT CHAINS
IG LAMBDA FREE LIGHT CHAIN: 20.8 mg/L (ref 5.7–26.3)
Ig Kappa Free Light Chain: 68.6 mg/L — ABNORMAL HIGH (ref 3.3–19.4)
KAPPA/LAMBDA FLC RATIO: 3.3 — AB (ref 0.26–1.65)

## 2016-03-08 MED ORDER — MONTELUKAST SODIUM 10 MG PO TABS
10.0000 mg | ORAL_TABLET | Freq: Once | ORAL | Status: AC
  Administered 2016-03-08: 10 mg via ORAL
  Filled 2016-03-08: qty 1

## 2016-03-08 MED ORDER — METHYLPREDNISOLONE SODIUM SUCC 125 MG IJ SOLR
125.0000 mg | Freq: Once | INTRAMUSCULAR | Status: AC
  Administered 2016-03-08: 125 mg via INTRAVENOUS

## 2016-03-08 MED ORDER — PROCHLORPERAZINE MALEATE 10 MG PO TABS
ORAL_TABLET | ORAL | Status: AC
  Filled 2016-03-08: qty 1

## 2016-03-08 MED ORDER — METHYLPREDNISOLONE SODIUM SUCC 125 MG IJ SOLR
INTRAMUSCULAR | Status: AC
  Filled 2016-03-08: qty 2

## 2016-03-08 MED ORDER — ACETAMINOPHEN 325 MG PO TABS
ORAL_TABLET | ORAL | Status: AC
  Filled 2016-03-08: qty 2

## 2016-03-08 MED ORDER — SODIUM CHLORIDE 0.9 % IV SOLN
400.0000 mg | Freq: Once | INTRAVENOUS | Status: AC
  Administered 2016-03-08: 400 mg via INTRAVENOUS
  Filled 2016-03-08: qty 20

## 2016-03-08 MED ORDER — PROCHLORPERAZINE MALEATE 10 MG PO TABS
10.0000 mg | ORAL_TABLET | Freq: Once | ORAL | Status: AC
  Administered 2016-03-08: 10 mg via ORAL

## 2016-03-08 MED ORDER — ACETAMINOPHEN 325 MG PO TABS
650.0000 mg | ORAL_TABLET | Freq: Once | ORAL | Status: AC
  Administered 2016-03-08: 650 mg via ORAL

## 2016-03-08 MED ORDER — DIPHENHYDRAMINE HCL 25 MG PO CAPS
50.0000 mg | ORAL_CAPSULE | Freq: Once | ORAL | Status: AC
  Administered 2016-03-08: 50 mg via ORAL

## 2016-03-08 MED ORDER — DIPHENHYDRAMINE HCL 25 MG PO CAPS
ORAL_CAPSULE | ORAL | Status: AC
Start: 2016-03-08 — End: 2016-03-08
  Filled 2016-03-08: qty 2

## 2016-03-08 NOTE — Patient Instructions (Signed)
[facility] Discharge Instructions for Patients Receiving Chemotherapy  Today you received the following immunotherapy agents;  Daratumumab.    THESE ARE SYMPTOMS THAT SHOULD BE REPORTED IMMEDIATELY:  *FEVER GREATER THAN 100.5 F  *CHILLS WITH OR WITHOUT FEVER  NAUSEA AND VOMITING THAT IS NOT CONTROLLED WITH YOUR NAUSEA MEDICATION  *UNUSUAL SHORTNESS OF BREATH  *UNUSUAL BRUISING OR BLEEDING  TENDERNESS IN MOUTH AND THROAT WITH OR WITHOUT PRESENCE OF ULCERS  *URINARY PROBLEMS  *BOWEL PROBLEMS  UNUSUAL RASH Items with * indicate a potential emergency and should be followed up as soon as possible.  Feel free to call the clinic you have any questions or concerns. The clinic phone number is [phone].  Please show the [facility] at check-in to the Emergency Department and triage nurse.

## 2016-03-10 ENCOUNTER — Inpatient Hospital Stay (HOSPITAL_COMMUNITY)
Admission: EM | Admit: 2016-03-10 | Discharge: 2016-03-29 | DRG: 871 | Disposition: A | Discharge status: Home / Self Care | Payer: Medicare Other | Attending: Internal Medicine | Admitting: Internal Medicine

## 2016-03-10 ENCOUNTER — Other Ambulatory Visit: Payer: Self-pay

## 2016-03-10 ENCOUNTER — Emergency Department (HOSPITAL_COMMUNITY): Payer: Medicare Other

## 2016-03-10 ENCOUNTER — Encounter (HOSPITAL_COMMUNITY): Payer: Self-pay | Admitting: Emergency Medicine

## 2016-03-10 DIAGNOSIS — J9690 Respiratory failure, unspecified, unspecified whether with hypoxia or hypercapnia: Secondary | ICD-10-CM | POA: Diagnosis not present

## 2016-03-10 DIAGNOSIS — T451X5A Adverse effect of antineoplastic and immunosuppressive drugs, initial encounter: Secondary | ICD-10-CM | POA: Diagnosis not present

## 2016-03-10 DIAGNOSIS — D709 Neutropenia, unspecified: Secondary | ICD-10-CM | POA: Diagnosis not present

## 2016-03-10 DIAGNOSIS — I429 Cardiomyopathy, unspecified: Secondary | ICD-10-CM | POA: Diagnosis present

## 2016-03-10 DIAGNOSIS — M40204 Unspecified kyphosis, thoracic region: Secondary | ICD-10-CM | POA: Diagnosis present

## 2016-03-10 DIAGNOSIS — Z5111 Encounter for antineoplastic chemotherapy: Secondary | ICD-10-CM | POA: Diagnosis not present

## 2016-03-10 DIAGNOSIS — J9601 Acute respiratory failure with hypoxia: Secondary | ICD-10-CM | POA: Diagnosis not present

## 2016-03-10 DIAGNOSIS — D701 Agranulocytosis secondary to cancer chemotherapy: Secondary | ICD-10-CM | POA: Diagnosis not present

## 2016-03-10 DIAGNOSIS — D6481 Anemia due to antineoplastic chemotherapy: Secondary | ICD-10-CM

## 2016-03-10 DIAGNOSIS — E876 Hypokalemia: Secondary | ICD-10-CM | POA: Diagnosis not present

## 2016-03-10 DIAGNOSIS — D61818 Other pancytopenia: Secondary | ICD-10-CM | POA: Diagnosis not present

## 2016-03-10 DIAGNOSIS — I252 Old myocardial infarction: Secondary | ICD-10-CM

## 2016-03-10 DIAGNOSIS — Z7951 Long term (current) use of inhaled steroids: Secondary | ICD-10-CM

## 2016-03-10 DIAGNOSIS — C9002 Multiple myeloma in relapse: Secondary | ICD-10-CM | POA: Diagnosis present

## 2016-03-10 DIAGNOSIS — I110 Hypertensive heart disease with heart failure: Secondary | ICD-10-CM | POA: Diagnosis not present

## 2016-03-10 DIAGNOSIS — Z923 Personal history of irradiation: Secondary | ICD-10-CM

## 2016-03-10 DIAGNOSIS — A419 Sepsis, unspecified organism: Principal | ICD-10-CM | POA: Diagnosis present

## 2016-03-10 DIAGNOSIS — G893 Neoplasm related pain (acute) (chronic): Secondary | ICD-10-CM | POA: Diagnosis present

## 2016-03-10 DIAGNOSIS — R846 Abnormal cytological findings in specimens from respiratory organs and thorax: Secondary | ICD-10-CM | POA: Diagnosis not present

## 2016-03-10 DIAGNOSIS — E46 Unspecified protein-calorie malnutrition: Secondary | ICD-10-CM | POA: Diagnosis present

## 2016-03-10 DIAGNOSIS — R509 Fever, unspecified: Secondary | ICD-10-CM | POA: Diagnosis not present

## 2016-03-10 DIAGNOSIS — E785 Hyperlipidemia, unspecified: Secondary | ICD-10-CM | POA: Diagnosis present

## 2016-03-10 DIAGNOSIS — C9000 Multiple myeloma not having achieved remission: Secondary | ICD-10-CM | POA: Diagnosis present

## 2016-03-10 DIAGNOSIS — D696 Thrombocytopenia, unspecified: Secondary | ICD-10-CM | POA: Diagnosis not present

## 2016-03-10 DIAGNOSIS — D702 Other drug-induced agranulocytosis: Secondary | ICD-10-CM

## 2016-03-10 DIAGNOSIS — R079 Chest pain, unspecified: Secondary | ICD-10-CM | POA: Diagnosis not present

## 2016-03-10 DIAGNOSIS — Z681 Body mass index (BMI) 19 or less, adult: Secondary | ICD-10-CM | POA: Diagnosis not present

## 2016-03-10 DIAGNOSIS — I519 Heart disease, unspecified: Secondary | ICD-10-CM | POA: Diagnosis not present

## 2016-03-10 DIAGNOSIS — Z7902 Long term (current) use of antithrombotics/antiplatelets: Secondary | ICD-10-CM

## 2016-03-10 DIAGNOSIS — J9811 Atelectasis: Secondary | ICD-10-CM | POA: Diagnosis not present

## 2016-03-10 DIAGNOSIS — Z880 Allergy status to penicillin: Secondary | ICD-10-CM

## 2016-03-10 DIAGNOSIS — Y95 Nosocomial condition: Secondary | ICD-10-CM | POA: Diagnosis present

## 2016-03-10 DIAGNOSIS — D539 Nutritional anemia, unspecified: Secondary | ICD-10-CM | POA: Diagnosis not present

## 2016-03-10 DIAGNOSIS — I5189 Other ill-defined heart diseases: Secondary | ICD-10-CM | POA: Diagnosis present

## 2016-03-10 DIAGNOSIS — Z79899 Other long term (current) drug therapy: Secondary | ICD-10-CM

## 2016-03-10 DIAGNOSIS — E2740 Unspecified adrenocortical insufficiency: Secondary | ICD-10-CM | POA: Diagnosis not present

## 2016-03-10 DIAGNOSIS — R05 Cough: Secondary | ICD-10-CM

## 2016-03-10 DIAGNOSIS — J189 Pneumonia, unspecified organism: Secondary | ICD-10-CM | POA: Diagnosis not present

## 2016-03-10 DIAGNOSIS — E43 Unspecified severe protein-calorie malnutrition: Secondary | ICD-10-CM | POA: Diagnosis present

## 2016-03-10 DIAGNOSIS — Z7969 Long term (current) use of other immunomodulators and immunosuppressants: Secondary | ICD-10-CM

## 2016-03-10 DIAGNOSIS — R0602 Shortness of breath: Secondary | ICD-10-CM | POA: Diagnosis not present

## 2016-03-10 DIAGNOSIS — J181 Lobar pneumonia, unspecified organism: Secondary | ICD-10-CM | POA: Diagnosis present

## 2016-03-10 DIAGNOSIS — R5081 Fever presenting with conditions classified elsewhere: Secondary | ICD-10-CM | POA: Diagnosis not present

## 2016-03-10 DIAGNOSIS — E860 Dehydration: Secondary | ICD-10-CM | POA: Diagnosis present

## 2016-03-10 DIAGNOSIS — D61810 Antineoplastic chemotherapy induced pancytopenia: Secondary | ICD-10-CM | POA: Diagnosis present

## 2016-03-10 DIAGNOSIS — Z7952 Long term (current) use of systemic steroids: Secondary | ICD-10-CM

## 2016-03-10 DIAGNOSIS — R059 Cough, unspecified: Secondary | ICD-10-CM

## 2016-03-10 DIAGNOSIS — M810 Age-related osteoporosis without current pathological fracture: Secondary | ICD-10-CM | POA: Diagnosis not present

## 2016-03-10 DIAGNOSIS — Z9889 Other specified postprocedural states: Secondary | ICD-10-CM

## 2016-03-10 DIAGNOSIS — I259 Chronic ischemic heart disease, unspecified: Secondary | ICD-10-CM | POA: Diagnosis not present

## 2016-03-10 DIAGNOSIS — R0902 Hypoxemia: Secondary | ICD-10-CM | POA: Diagnosis not present

## 2016-03-10 DIAGNOSIS — R062 Wheezing: Secondary | ICD-10-CM | POA: Diagnosis not present

## 2016-03-10 DIAGNOSIS — I2510 Atherosclerotic heart disease of native coronary artery without angina pectoris: Secondary | ICD-10-CM | POA: Diagnosis not present

## 2016-03-10 DIAGNOSIS — J90 Pleural effusion, not elsewhere classified: Secondary | ICD-10-CM | POA: Diagnosis not present

## 2016-03-10 DIAGNOSIS — D649 Anemia, unspecified: Secondary | ICD-10-CM | POA: Diagnosis not present

## 2016-03-10 DIAGNOSIS — I5043 Acute on chronic combined systolic (congestive) and diastolic (congestive) heart failure: Secondary | ICD-10-CM | POA: Diagnosis not present

## 2016-03-10 DIAGNOSIS — Z881 Allergy status to other antibiotic agents status: Secondary | ICD-10-CM

## 2016-03-10 DIAGNOSIS — Z8249 Family history of ischemic heart disease and other diseases of the circulatory system: Secondary | ICD-10-CM

## 2016-03-10 DIAGNOSIS — J9600 Acute respiratory failure, unspecified whether with hypoxia or hypercapnia: Secondary | ICD-10-CM

## 2016-03-10 LAB — CBC WITH DIFFERENTIAL/PLATELET
BASOS ABS: 0 10*3/uL (ref 0.0–0.1)
Basophils Relative: 0 %
EOS ABS: 0.3 10*3/uL (ref 0.0–0.7)
Eosinophils Relative: 10 %
HEMATOCRIT: 33.3 % — AB (ref 36.0–46.0)
Hemoglobin: 11.4 g/dL — ABNORMAL LOW (ref 12.0–15.0)
LYMPHS ABS: 0.3 10*3/uL — AB (ref 0.7–4.0)
LYMPHS PCT: 9 %
MCH: 35 pg — ABNORMAL HIGH (ref 26.0–34.0)
MCHC: 34.2 g/dL (ref 30.0–36.0)
MCV: 102.1 fL — AB (ref 78.0–100.0)
Monocytes Absolute: 0.2 10*3/uL (ref 0.1–1.0)
Monocytes Relative: 8 %
NEUTROS PCT: 74 %
Neutro Abs: 2.1 10*3/uL (ref 1.7–7.7)
PLATELETS: 81 10*3/uL — AB (ref 150–400)
RBC: 3.26 MIL/uL — AB (ref 3.87–5.11)
RDW: 15.5 % (ref 11.5–15.5)
WBC: 2.9 10*3/uL — AB (ref 4.0–10.5)

## 2016-03-10 LAB — COMPREHENSIVE METABOLIC PANEL
ALT: 43 U/L (ref 14–54)
ANION GAP: 5 (ref 5–15)
AST: 31 U/L (ref 15–41)
Albumin: 3.3 g/dL — ABNORMAL LOW (ref 3.5–5.0)
Alkaline Phosphatase: 67 U/L (ref 38–126)
BUN: 15 mg/dL (ref 6–20)
CHLORIDE: 103 mmol/L (ref 101–111)
CO2: 26 mmol/L (ref 22–32)
Calcium: 8.3 mg/dL — ABNORMAL LOW (ref 8.9–10.3)
Creatinine, Ser: 0.81 mg/dL (ref 0.44–1.00)
GFR calc non Af Amer: >60 mL/min (ref >60)
Glucose, Bld: 97 mg/dL (ref 65–99)
POTASSIUM: 3.4 mmol/L — AB (ref 3.5–5.1)
SODIUM: 134 mmol/L — AB (ref 135–145)
Total Bilirubin: 1.1 mg/dL (ref 0.3–1.2)
Total Protein: 6.9 g/dL (ref 6.5–8.1)

## 2016-03-10 LAB — URINALYSIS, ROUTINE W REFLEX MICROSCOPIC
Bilirubin Urine: NEGATIVE
Glucose, UA: NEGATIVE mg/dL
Hgb urine dipstick: NEGATIVE
Ketones, ur: NEGATIVE mg/dL
LEUKOCYTES UA: NEGATIVE
NITRITE: NEGATIVE
PH: 7 (ref 5.0–8.0)
Protein, ur: NEGATIVE mg/dL
SPECIFIC GRAVITY, URINE: 1.017 (ref 1.005–1.030)

## 2016-03-10 LAB — TROPONIN I

## 2016-03-10 LAB — STREP PNEUMONIAE URINARY ANTIGEN: Strep Pneumo Urinary Antigen: NEGATIVE

## 2016-03-10 LAB — I-STAT CG4 LACTIC ACID, ED: LACTIC ACID, VENOUS: 1.48 mmol/L (ref 0.5–1.9)

## 2016-03-10 MED ORDER — DEXTROSE 5 % IV SOLN: 2.0000 g | Freq: Three times a day (TID) | INTRAVENOUS | Status: DC

## 2016-03-10 MED ORDER — GUAIFENESIN-DM 100-10 MG/5ML PO SYRP
5.0000 mL | ORAL_SOLUTION | ORAL | Status: DC | PRN
  Administered 2016-03-11 – 2016-03-24 (×3): 5 mL via ORAL
  Filled 2016-03-10 (×3): qty 10

## 2016-03-10 MED ORDER — BENZONATATE 100 MG PO CAPS
100.0000 mg | ORAL_CAPSULE | Freq: Once | ORAL | Status: DC
  Filled 2016-03-10: qty 1

## 2016-03-10 MED ORDER — CLOPIDOGREL BISULFATE 75 MG PO TABS
75.0000 mg | ORAL_TABLET | Freq: Every day | ORAL | Status: DC
  Administered 2016-03-11: 75 mg via ORAL
  Filled 2016-03-10: qty 1

## 2016-03-10 MED ORDER — IPRATROPIUM BROMIDE 0.02 % IN SOLN
0.5000 mg | Freq: Once | RESPIRATORY_TRACT | Status: AC
  Administered 2016-03-10: 0.5 mg via RESPIRATORY_TRACT
  Filled 2016-03-10: qty 2.5

## 2016-03-10 MED ORDER — METOPROLOL SUCCINATE ER 25 MG PO TB24
25.0000 mg | ORAL_TABLET | Freq: Every day | ORAL | Status: DC
  Administered 2016-03-12 – 2016-03-16 (×5): 25 mg via ORAL
  Filled 2016-03-10 (×6): qty 1

## 2016-03-10 MED ORDER — PROCHLORPERAZINE MALEATE 10 MG PO TABS
10.0000 mg | ORAL_TABLET | Freq: Four times a day (QID) | ORAL | Status: DC | PRN
  Filled 2016-03-10: qty 1

## 2016-03-10 MED ORDER — BENZONATATE 100 MG PO CAPS
100.0000 mg | ORAL_CAPSULE | Freq: Once | ORAL | Status: AC
  Administered 2016-03-10: 100 mg via ORAL
  Filled 2016-03-10: qty 1

## 2016-03-10 MED ORDER — ONDANSETRON HCL 8 MG PO TABS
8.0000 mg | ORAL_TABLET | Freq: Two times a day (BID) | ORAL | Status: DC | PRN
  Administered 2016-03-14 – 2016-03-15 (×2): 8 mg via ORAL
  Filled 2016-03-10 (×2): qty 2

## 2016-03-10 MED ORDER — ENOXAPARIN SODIUM 40 MG/0.4ML ~~LOC~~ SOLN
40.0000 mg | SUBCUTANEOUS | Status: DC
  Administered 2016-03-10 – 2016-03-11 (×2): 40 mg via SUBCUTANEOUS
  Filled 2016-03-10 (×2): qty 0.4

## 2016-03-10 MED ORDER — MORPHINE SULFATE 15 MG PO TABS: 15.0000 mg | ORAL_TABLET | Freq: Four times a day (QID) | ORAL | Status: DC | PRN

## 2016-03-10 MED ORDER — ALBUTEROL SULFATE (2.5 MG/3ML) 0.083% IN NEBU
5.0000 mg | INHALATION_SOLUTION | Freq: Once | RESPIRATORY_TRACT | Status: AC
  Administered 2016-03-10: 5 mg via RESPIRATORY_TRACT
  Filled 2016-03-10: qty 6

## 2016-03-10 MED ORDER — DEXTROSE 5 % IV SOLN
1.0000 g | Freq: Three times a day (TID) | INTRAVENOUS | Status: DC
  Administered 2016-03-10 – 2016-03-12 (×5): 1 g via INTRAVENOUS
  Filled 2016-03-10 (×7): qty 1

## 2016-03-10 MED ORDER — ACETAMINOPHEN 500 MG PO TABS
500.0000 mg | ORAL_TABLET | Freq: Four times a day (QID) | ORAL | Status: DC | PRN
  Administered 2016-03-10 – 2016-03-15 (×5): 500 mg via ORAL
  Filled 2016-03-10 (×6): qty 1

## 2016-03-10 MED ORDER — ATORVASTATIN CALCIUM 40 MG PO TABS
40.0000 mg | ORAL_TABLET | Freq: Every day | ORAL | Status: DC
  Administered 2016-03-11 – 2016-03-29 (×19): 40 mg via ORAL
  Filled 2016-03-10 (×13): qty 1
  Filled 2016-03-10: qty 4
  Filled 2016-03-10 (×5): qty 1

## 2016-03-10 MED ORDER — AZTREONAM 2 G IJ SOLR: 2.0000 g | Freq: Once | INTRAMUSCULAR | Status: DC

## 2016-03-10 MED ORDER — ZOLPIDEM TARTRATE 5 MG PO TABS
5.0000 mg | ORAL_TABLET | Freq: Every evening | ORAL | Status: DC | PRN
  Administered 2016-03-10 – 2016-03-26 (×16): 5 mg via ORAL
  Filled 2016-03-10 (×17): qty 1

## 2016-03-10 MED ORDER — IPRATROPIUM-ALBUTEROL 0.5-2.5 (3) MG/3ML IN SOLN
3.0000 mL | RESPIRATORY_TRACT | Status: DC | PRN
  Administered 2016-03-11: 3 mL via RESPIRATORY_TRACT
  Filled 2016-03-10: qty 3

## 2016-03-10 MED ORDER — CALCIUM CARBONATE 1250 (500 CA) MG PO TABS
1250.0000 mg | ORAL_TABLET | Freq: Every day | ORAL | Status: DC
  Administered 2016-03-10 – 2016-03-29 (×20): 1250 mg via ORAL
  Filled 2016-03-10 (×20): qty 1

## 2016-03-10 MED ORDER — ACYCLOVIR 400 MG PO TABS
400.0000 mg | ORAL_TABLET | Freq: Two times a day (BID) | ORAL | Status: DC
  Administered 2016-03-10 – 2016-03-21 (×23): 400 mg via ORAL
  Filled 2016-03-10 (×23): qty 1

## 2016-03-10 MED ORDER — DEXTROSE 5 % IV SOLN
2.0000 g | INTRAVENOUS | Status: AC
  Administered 2016-03-10: 2 g via INTRAVENOUS
  Filled 2016-03-10: qty 2

## 2016-03-10 MED ORDER — SODIUM CHLORIDE 0.9 % IV BOLUS (SEPSIS)
500.0000 mL | Freq: Once | INTRAVENOUS | Status: AC
  Administered 2016-03-10: 500 mL via INTRAVENOUS

## 2016-03-10 MED ORDER — VANCOMYCIN HCL IN DEXTROSE 750-5 MG/150ML-% IV SOLN
750.0000 mg | INTRAVENOUS | Status: AC
  Administered 2016-03-10: 750 mg via INTRAVENOUS
  Filled 2016-03-10: qty 150

## 2016-03-10 MED ORDER — VANCOMYCIN HCL IN DEXTROSE 750-5 MG/150ML-% IV SOLN
750.0000 mg | Freq: Two times a day (BID) | INTRAVENOUS | Status: DC
  Administered 2016-03-10 – 2016-03-12 (×3): 750 mg via INTRAVENOUS
  Filled 2016-03-10 (×4): qty 150

## 2016-03-10 MED ORDER — FLUTICASONE PROPIONATE 50 MCG/ACT NA SUSP
2.0000 | Freq: Every day | NASAL | Status: DC
  Administered 2016-03-11 – 2016-03-29 (×19): 2 via NASAL
  Filled 2016-03-10: qty 16

## 2016-03-10 NOTE — H&P (Signed)
History and Physical    Shannon Rush EGB:[id number] DOB: 09/10/1940 DOA: 03/10/2016  PCP: [name], MD  Outpatient Specialists: Dr. Ellender Hose, Dr. Johnsie Cancel, cardiology Patient coming from: home  Chief Complaint: Shortness of breath  HPI: Shannon Rush is a 75 y.o. female with medical history significant of multiple myeloma currently undergoing chemotherapy, coronary artery disease status post STEMI in August 2016 status post thrombolytics, osteoporosis, hyperlipidemia, presents to the emergency room with chief complaint of shortness of breath. Patient tells me that over the last 5-6 days she's been having subjective fevers at home, chest congestion and a productive cough. She saw her primary care provider about 3 days ago and at that time she was prescribed levofloxacin which patient has been taking for about 3 days. She thought she was getting better, however in the last 24 hours she started experiencing more difficulties with breathing, and she decided to come to the emergency room. She complains of chest pain with coughing. She complains of chills. She has no abdominal pain, no nausea, vomiting. She has no lightheadedness or dizziness.  ED Course: In the emergency room, patient was found to be hypoxic requiring oxygen supplementation, she had a low-grade temp of 99.9, she is tachypnea into the mid 20s. Lab work revealed an cytopenia. Lactic acid is normal. Chest x-ray showed bibasilar opacities concerning for pneumonia. TRH less for admission for healthcare associated pneumonia  Review of Systems: As per HPI otherwise 10 point review of systems negative.   Past Medical History:  Diagnosis Date  . CAD (coronary artery disease)    a. inf STEMI (in [place] >>> lytics) >>> LHC (8/15- [place]):  pRCA 60%, mid to dist RCA 50% >>> med rx  . Cord compression ([place]) 07/13/12   MRI- diffuse myeloma involvement of T-L spine  . History of radiation therapy  07/13/12-07/27/12   spinal cord compression T3-T10,left scapula  . Hx of echocardiogram    Echo (03/02/14-done at [facility]):  Normal LVEF without significant regional wall motion abnormalities. Mild   . Multiple myeloma ([place]) 07/01/2012  . OSTEOPOROSIS 06/11/2010   Multiple compression fractures; and spontaneous fracture of sternum Qualifier: Diagnosis of  By: [name], [name]     . Thoracic kyphosis 07/13/12   per MRI scan  . Unspecified deficiency anemia     Past Surgical History:  Procedure Laterality Date  . APPENDECTOMY    . CESAREAN SECTION     x2   . COLONOSCOPY  2007   neg with Dr. [name]  . ELBOW SURGERY    . HIP SURGERY  2009   left  . TUBAL LIGATION       reports that she has never smoked. She has never used smokeless tobacco. She reports that she drinks about 3.5 oz of alcohol per week . She reports that she does not use drugs.  Allergies  Allergen Reactions  . Zosyn [Piperacillin Sod-Tazobactam So] Rash    Itching, rash and swelling of extremities  . Piperacillin-Tazobactam In Dex Rash  . Zithromax [Azithromycin]     Full body rash  . Ciprofloxacin Rash    Throwing up and stomach pain     Family History  Problem Relation Age of Onset  . Glaucoma Mother   . Heart disease Mother   . Heart disease Father   . Cancer Brother     stage IV prostate CA    Prior to Admission medications   Medication Sig Start Date End  Date Taking? Authorizing Provider  acetaminophen (TYLENOL) 500 MG tablet Take 500 mg by mouth every 6 (six) hours as needed for moderate pain (pain). Reported on 07/20/2015    Historical Provider, MD  acyclovir (ZOVIRAX) 400 MG tablet Take 1 tablet (400 mg total) by mouth 2 (two) times daily. 03/07/16   [name], MD  atorvastatin (LIPITOR) 40 MG tablet Take 1 tablet (40 mg total) by mouth daily. 08/23/15   Josue Hector, MD  calcium carbonate (OS-CAL) 600 MG TABS tablet Take 600 mg by mouth daily.    Historical Provider, MD    clopidogrel (PLAVIX) 75 MG tablet Take 1 tablet (75 mg total) by mouth daily. 06/01/15   Josue Hector, MD  dexamethasone (DECADRON) 4 MG tablet Take 1 tablet (4 mg total) by mouth once a week. 02/07/16   [name], MD  fluticasone (FLONASE) 50 MCG/ACT nasal spray Place 2 sprays into both nostrils daily. 02/15/16   [place] N Rumley, DO  levofloxacin (LEVAQUIN) 750 MG tablet Take 1 tablet (750 mg total) by mouth daily. 03/07/16   [name], MD  metoprolol succinate (TOPROL-XL) 25 MG 24 hr tablet Take 1 tablet (25 mg total) by mouth daily. 06/01/15   Josue Hector, MD  morphine (MSIR) 15 MG tablet Take 1 tablet (15 mg total) by mouth every 6 (six) hours as needed for severe pain. 02/10/14   [name], MD  nitroGLYCERIN (NITROSTAT) 0.4 MG SL tablet Place under the tongue. Reported on 07/20/2015 03/03/14   Historical Provider, MD  ondansetron (ZOFRAN) 8 MG tablet Take 1 tablet (8 mg total) by mouth 2 (two) times daily as needed (Nausea or vomiting). 03/07/16   [name], MD  pomalidomide (POMALYST) 4 MG capsule Take 1 capsule (4 mg total) by mouth daily. Take with water on days 1-21. Repeat every 28 days. 02/07/16   [name], MD  PROAIR HFA 108 (90 BASE) MCG/ACT inhaler Inhale 2 puffs into the lungs every 6 (six) hours as needed. 09/14/14   Historical Provider, MD  prochlorperazine (COMPAZINE) 10 MG tablet Take 1 tablet (10 mg total) by mouth every 6 (six) hours as needed (Nausea or vomiting). 03/07/16   [name], MD    Physical Exam: Vitals:   03/10/16 0952 03/10/16 1137 03/10/16 1141 03/10/16 1254  BP: 147/96   (!) 118/54  Pulse: 93   95  Resp: 16   22  SpO2: 93% (!) 89% (!) 81% (!) 89%      Constitutional: NAD, tachypneic however Vitals:   03/10/16 0952 03/10/16 1137 03/10/16 1141 03/10/16 1254  BP: 147/96   (!) 118/54  Pulse: 93   95  Resp: 16   22  SpO2: 93% (!) 89% (!) 81% (!) 89%   Eyes: PERRL ENMT: Mucous membranes are moist.   Neck: normal, supple Respiratory: Diffuse rhonchi  bilaterally, no wheezing Cardiovascular: Regular rate and rhythm, no murmurs / rubs / gallops. No extremity edema. 2+ pedal pulses.  Abdomen: no tenderness, no masses palpated. Bowel sounds positive.  Musculoskeletal: no clubbing / cyanosis. Normal muscle tone.  Skin: no rashes, lesions, ulcers. No induration Neurologic: Nonfocal Psychiatric: Normal judgment and insight. Alert and oriented x 3. Normal mood.   Labs on Admission: I have personally reviewed following labs and imaging studies  CBC:  Recent Labs Lab 03/07/16 0927 03/10/16 1005  WBC 6.4 2.9*  NEUTROABS 5.0 2.1  HGB 9.5* 11.4*  HCT 28.7* 33.3*  MCV 104.8* 102.1*  PLT 155 81*   Basic  Metabolic Panel:  Recent Labs Lab 03/07/16 0927 03/10/16 1005  NA 139 134*  K 3.7 3.4*  CL  --  103  CO2 24 26  GLUCOSE 72 97  BUN 26.8* 15  CREATININE 0.9 0.81  CALCIUM 9.1 8.3*   GFR: Estimated Creatinine Clearance: 55.2 mL/min (by C-G formula based on SCr of 0.81 mg/dL). Liver Function Tests:  Recent Labs Lab 03/07/16 0927 03/10/16 1005  AST 31 31  ALT 27 43  ALKPHOS 81 67  BILITOT 0.53 1.1  PROT 7.5 6.9  ALBUMIN 3.1* 3.3*   No results for input(s): LIPASE, AMYLASE in the last 168 hours. No results for input(s): AMMONIA in the last 168 hours. Coagulation Profile: No results for input(s): INR, PROTIME in the last 168 hours. Cardiac Enzymes: No results for input(s): CKTOTAL, CKMB, CKMBINDEX, TROPONINI in the last 168 hours. BNP (last 3 results) No results for input(s): PROBNP in the last 8760 hours. HbA1C: No results for input(s): HGBA1C in the last 72 hours. CBG: No results for input(s): GLUCAP in the last 168 hours. Lipid Profile: No results for input(s): CHOL, HDL, LDLCALC, TRIG, CHOLHDL, LDLDIRECT in the last 72 hours. Thyroid Function Tests: No results for input(s): TSH, T4TOTAL, FREET4, T3FREE, THYROIDAB in the last 72 hours. Anemia Panel: No results for input(s): VITAMINB12, FOLATE, FERRITIN, TIBC,  IRON, RETICCTPCT in the last 72 hours. Urine analysis:    Component Value Date/Time   COLORURINE YELLOW 03/10/2016 0954   APPEARANCEUR CLEAR 03/10/2016 0954   LABSPEC 1.017 03/10/2016 0954   LABSPEC 1.015 08/20/2012 1124   PHURINE 7.0 03/10/2016 0954   GLUCOSEU NEGATIVE 03/10/2016 0954   GLUCOSEU Negative 08/20/2012 1124   HGBUR NEGATIVE 03/10/2016 0954   HGBUR negative 10/22/2007 1336   BILIRUBINUR NEGATIVE 03/10/2016 0954   BILIRUBINUR Negative 08/20/2012 1124   KETONESUR NEGATIVE 03/10/2016 0954   PROTEINUR NEGATIVE 03/10/2016 0954   UROBILINOGEN 0.2 09/14/2014 2100   UROBILINOGEN 0.2 08/20/2012 1124   NITRITE NEGATIVE 03/10/2016 0954   LEUKOCYTESUR NEGATIVE 03/10/2016 0954   LEUKOCYTESUR Small 08/20/2012 1124   Sepsis Labs: @LABRCNTIP (procalcitonin:4,lacticidven:4) )No results found for this or any previous visit (from the past 240 hour(s)).   Radiological Exams on Admission: Dg Chest 2 View  Result Date: 03/10/2016 CLINICAL DATA:  Cough and shortness of breath.  Chest pain EXAM: CHEST  2 VIEW COMPARISON:  11/02/2014 FINDINGS: Normal heart size. Advanced chronic interstitial lung changes are again identified. Superimposed bibasilar opacities are identified. There are small pleural effusions present. Chronic multi level compression deformities are identified within the thoracic spine. IMPRESSION: 1. Bibasilar opacities which may represent atelectasis and/or pneumonia. 2. Small bilateral pleural effusions. Electronically Signed   By: Kerby Moors M.D.   On: 03/10/2016 10:25    EKG: Independently reviewed. Sinus rhythm  Assessment/Plan Active Problems:   Anemia, macrocytic   Multiple myeloma (HCC)   Coronary atherosclerosis of native coronary artery   Pancytopenia (HCC)   Protein-calorie malnutrition (HCC)   HCAP (healthcare-associated pneumonia)   Respiratory failure, acute ([place])    Healthcare associated pneumonia - positive chest x-ray and typical symptoms for  pneumonia, failing to respond to outpatient Levaquin - Patient with pancytopenia, currently on chemotherapy, start broad-spectrum antibiotics with vancomycin and cefepime - Obtain cultures, sputum culture - no blood cultures obtained prior to antibiotics administration  Acute hypoxic respiratory failure - Likely in the setting of #1, oxygen as needed  Multiple myeloma - Currently undergoing chemotherapy, will tag Dr. Burr Medico  Pancytopenia with leukopenia, anemia and thrombocytopenia - Likely  due to underlying malignancy as well as chemotherapy, transfuse to keep hemoglobin greater than 7 - She is not neutropenic currently  Coronary artery disease - No cardiac type chest pain, she has pain with coughing, continue Plavix and Toprol, however has known disease and we'll cycle cardiac enzymes   DVT prophylaxis: Lovenox  Code Status: Full  Family Communication: no familybedside Disposition Plan: admit to tele Consults called: none  Admission status: inpatient    Marzetta Board, MD [facility] Pager [phone]  If 7PM-7AM, please contact night-coverage [email or web address] Password [email or web address]  03/10/2016, 1:45 PM

## 2016-03-10 NOTE — Progress Notes (Signed)
Pharmacy Antibiotic Note  Shannon Rush is a 75 y.o. female with PMH of multiple myeloma admitted on 03/10/2016 with suspected pneumonia. Pharmacy has been consulted for Vancomycin and Cefepime dosing. Noted patient's allergy to Zosyn; however, she tolerated multiple doses of Cefepime in January 2016.  Plan: Vancomycin 778m IV q12h per review of past dosing history. Plan for Vancomycin level at steady state. Goal trough level 15-20 mcg/mL. Cefepime 2g IV x 1, then 1g IV q8h.  Monitor renal function, cultures, clinical course.     No data recorded.   Recent Labs Lab 03/07/16 0927 03/07/16 0927 03/10/16 1005 03/10/16 1014  WBC 6.4  --  2.9*  --   CREATININE  --  0.9 0.81  --   LATICACIDVEN  --   --   --  1.48    Estimated Creatinine Clearance: 55.2 mL/min (by C-G formula based on SCr of 0.81 mg/dL).    Allergies  Allergen Reactions  . Zosyn [Piperacillin Sod-Tazobactam So] Rash    Itching, rash and swelling of extremities  . Piperacillin-Tazobactam In Dex Rash  . Zithromax [Azithromycin]     Full body rash  . Ciprofloxacin Rash    Throwing up and stomach pain     Antimicrobials this admission: 8/27 >> Vancomycin >> 8/27 >> Cefepime >>  Dose adjustments this admission: ---  Microbiology results: 8/27 UCx: sent    Thank you for allowing pharmacy to be a part of this patient's care.   JLindell Spar PharmD, BCPS Pager: [phone]/27/2017 11:45 AM

## 2016-03-10 NOTE — ED Provider Notes (Signed)
[facility] DEPT Provider Note   CSN: [id number] Arrival date & time: 03/10/16  0944     History   Chief Complaint Chief Complaint  Patient presents with  . Shortness of Breath    Immunocompromise    HPI Shannon Rush is a 75 y.o. female.  HPI  75 year old female with history of multiple myeloma with last dose of chemotherapy 2 days ago, CAD, anemia presenting to ED complaining of shortness of breath.Patient report for the past 2 weeks she has had cough productive with green sputum for the past 4 days she reported increased worsening shortness of breath is persistent and for the past 2 days she has fever and chills. She she was seen by her PCP early this week and she was started on Levaquin as treatment for suspect pneumonia. She has been taking it for the past 3 days which she noticed mild improvement but his symptoms still persist. She is currently on a new chemotherapy treatment which she had 2 sessions 2 days ago on Thursday and Friday. She denies any prior history of DVT or PE. Denies nausea vomiting or diarrhea, no hemoptysis, no leg swelling or calf pain. Does report pleuritic chest pain only with cough.  Past Medical History:  Diagnosis Date  . CAD (coronary artery disease)    a. inf STEMI (in [place] >>> lytics) >>> LHC (8/15- [place]):  pRCA 60%, mid to dist RCA 50% >>> med rx  . Cord compression ([place]) 07/13/12   MRI- diffuse myeloma involvement of T-L spine  . History of radiation therapy 07/13/12-07/27/12   spinal cord compression T3-T10,left scapula  . Hx of echocardiogram    Echo (03/02/14-done at [facility]):  Normal LVEF without significant regional wall motion abnormalities. Mild   . Multiple myeloma ([place]) 07/01/2012  . OSTEOPOROSIS 06/11/2010   Multiple compression fractures; and spontaneous fracture of sternum Qualifier: Diagnosis of  By: [name], [name]     . Thoracic kyphosis 07/13/12   per MRI scan  .  Unspecified deficiency anemia     Patient Active Problem List   Diagnosis Date Noted  . Left ear pain 02/18/2016  . Corn of foot 11/04/2014  . Protein-calorie malnutrition ([place]) 09/23/2014  . Edema of both legs 08/17/2014  . Pancytopenia ([place]) 08/10/2014  . Febrile neutropenia ([employer]) 08/08/2014  . Coronary atherosclerosis of native coronary artery 03/11/2014  . History of ST elevation myocardial infarction (STEMI) 03/10/2014  . Unspecified vitamin D deficiency 05/05/2013  . History of peripheral stem cell transplant ([place]) 03/05/2013  . History of organ or tissue transplant 03/05/2013  . Swelling, mass, or lump in chest 12/24/2012  . Multiple myeloma ([place]) 07/01/2012  . Sciatica of left side 06/03/2011  . Osteoporosis 06/11/2010  . Anemia, macrocytic 06/02/2009  . DJD, UNSPECIFIED 09/11/2006    Past Surgical History:  Procedure Laterality Date  . APPENDECTOMY    . CESAREAN SECTION     x2   . COLONOSCOPY  2007   neg with Dr. [name]  . ELBOW SURGERY    . HIP SURGERY  2009   left  . TUBAL LIGATION      OB History    No data available       Home Medications    Prior to Admission medications   Medication Sig Start Date End Date Taking? Authorizing Provider  acetaminophen (TYLENOL) 500 MG tablet Take 500 mg by mouth every 6 (six) hours as needed for moderate pain (pain). Reported  on 07/20/2015    Historical Provider, MD  acyclovir (ZOVIRAX) 400 MG tablet Take 1 tablet (400 mg total) by mouth 2 (two) times daily. 03/07/16   [name], MD  atorvastatin (LIPITOR) 40 MG tablet Take 1 tablet (40 mg total) by mouth daily. 08/23/15   Josue Hector, MD  calcium carbonate (OS-CAL) 600 MG TABS tablet Take 600 mg by mouth daily.    Historical Provider, MD  clopidogrel (PLAVIX) 75 MG tablet Take 1 tablet (75 mg total) by mouth daily. 06/01/15   Josue Hector, MD  dexamethasone (DECADRON) 4 MG tablet Take 1 tablet (4 mg total) by mouth once a week. 02/07/16   [name], MD  fluticasone  (FLONASE) 50 MCG/ACT nasal spray Place 2 sprays into both nostrils daily. 02/15/16    N Rumley, DO  levofloxacin (LEVAQUIN) 750 MG tablet Take 1 tablet (750 mg total) by mouth daily. 03/07/16   [name], MD  metoprolol succinate (TOPROL-XL) 25 MG 24 hr tablet Take 1 tablet (25 mg total) by mouth daily. 06/01/15   Josue Hector, MD  morphine (MSIR) 15 MG tablet Take 1 tablet (15 mg total) by mouth every 6 (six) hours as needed for severe pain. 02/10/14   [name], MD  nitroGLYCERIN (NITROSTAT) 0.4 MG SL tablet Place under the tongue. Reported on 07/20/2015 03/03/14   Historical Provider, MD  ondansetron (ZOFRAN) 8 MG tablet Take 1 tablet (8 mg total) by mouth 2 (two) times daily as needed (Nausea or vomiting). 03/07/16   [name], MD  pomalidomide (POMALYST) 4 MG capsule Take 1 capsule (4 mg total) by mouth daily. Take with water on days 1-21. Repeat every 28 days. 02/07/16   [name], MD  PROAIR HFA 108 (90 BASE) MCG/ACT inhaler Inhale 2 puffs into the lungs every 6 (six) hours as needed. 09/14/14   Historical Provider, MD  prochlorperazine (COMPAZINE) 10 MG tablet Take 1 tablet (10 mg total) by mouth every 6 (six) hours as needed (Nausea or vomiting). 03/07/16   [name], MD    Family History Family History  Problem Relation Age of Onset  . Glaucoma Mother   . Heart disease Mother   . Heart disease Father   . Cancer Brother     stage IV prostate CA    Social History Social History  Substance Use Topics  . Smoking status: Never Smoker  . Smokeless tobacco: Never Used  . Alcohol use 3.5 oz/week    7 Standard drinks or equivalent per week     Comment: 1 glass of wine a day     Allergies   Zosyn [piperacillin sod-tazobactam so]; Piperacillin-tazobactam in dex; Zithromax [azithromycin]; and Ciprofloxacin   Review of Systems Review of Systems  All other systems reviewed and are negative.    Physical Exam Updated Vital Signs BP 147/96 (BP Location: Left Arm)   Pulse 93   Resp  16   SpO2 93%   Physical Exam  Constitutional: She appears well-developed and well-nourished. No distress.  Elderly female laying in bed with occasional cough but in no acute respiratory discomfort.  HENT:  Head: Atraumatic.  Mouth/Throat: Oropharynx is clear and moist.  Eyes: Conjunctivae are normal.  Neck: Neck supple.  Cardiovascular: Normal rate, regular rhythm and intact distal pulses.   Pulmonary/Chest: She has wheezes.  Scattered rhonchi with expiratory wheezes and faint crackles at lung bases  Abdominal: Soft. There is no tenderness.  Musculoskeletal: She exhibits no edema.  Lymphadenopathy:    She  has no cervical adenopathy.  Neurological: She is alert.  Skin: No rash noted.  Psychiatric: She has a normal mood and affect.  Nursing note and vitals reviewed.    ED Treatments / Results  Labs (all labs ordered are listed, but only abnormal results are displayed) Labs Reviewed  COMPREHENSIVE METABOLIC PANEL - Abnormal; Notable for the following:       Result Value   Sodium 134 (*)    Potassium 3.4 (*)    Calcium 8.3 (*)    Albumin 3.3 (*)    All other components within normal limits  CBC WITH DIFFERENTIAL/PLATELET - Abnormal; Notable for the following:    WBC 2.9 (*)    RBC 3.26 (*)    Hemoglobin 11.4 (*)    HCT 33.3 (*)    MCV 102.1 (*)    MCH 35.0 (*)    Platelets 81 (*)    Lymphs Abs 0.3 (*)    All other components within normal limits  URINE CULTURE  CULTURE, EXPECTORATED SPUTUM-ASSESSMENT  GRAM STAIN  URINALYSIS, ROUTINE W REFLEX MICROSCOPIC (NOT AT [facility])  HIV ANTIBODY (ROUTINE TESTING)  STREP PNEUMONIAE URINARY ANTIGEN  TROPONIN I  TROPONIN I  TROPONIN I  I-STAT CG4 LACTIC ACID, ED  I-STAT CG4 LACTIC ACID, ED    EKG  EKG Interpretation  Date/Time:  Sunday March 10 2016 10:50:01 EDT Ventricular Rate:  78 PR Interval:    QRS Duration: 91 QT Interval:  323 QTC Calculation: 368 R Axis:   111 Text Interpretation:  Right and left arm electrode  reversal, interpretation assumes no reversal Sinus rhythm Right axis deviation Abnormal T, consider ischemia, --Unchaged vs 09/2014 Confirmed by Jeneen Rinks  MD, [place] ([id number]) on 03/10/2016 10:54:31 AM Also confirmed by Jeneen Rinks  MD, [place] ([id number]), editor [place], Joelene Millin [phone])  on 03/10/2016 11:35:16 AM       Radiology Dg Chest 2 View  Result Date: 03/10/2016 CLINICAL DATA:  Cough and shortness of breath.  Chest pain EXAM: CHEST  2 VIEW COMPARISON:  11/02/2014 FINDINGS: Normal heart size. Advanced chronic interstitial lung changes are again identified. Superimposed bibasilar opacities are identified. There are small pleural effusions present. Chronic multi level compression deformities are identified within the thoracic spine. IMPRESSION: 1. Bibasilar opacities which may represent atelectasis and/or pneumonia. 2. Small bilateral pleural effusions. Electronically Signed   By: Kerby Moors M.D.   On: 03/10/2016 10:25    Procedures Procedures (including critical care time)  Medications Ordered in ED Medications  vancomycin (VANCOCIN) IVPB 750 mg/150 ml premix (not administered)  ceFEPIme (MAXIPIME) 1 g in dextrose 5 % 50 mL IVPB (not administered)  enoxaparin (LOVENOX) injection 40 mg (not administered)  guaiFENesin-dextromethorphan (ROBITUSSIN DM) 100-10 MG/5ML syrup 5 mL (not administered)  ipratropium-albuterol (DUONEB) 0.5-2.5 (3) MG/3ML nebulizer solution 3 mL (not administered)  ipratropium (ATROVENT) nebulizer solution 0.5 mg (0.5 mg Nebulization Given 03/10/16 1135)  benzonatate (TESSALON) capsule 100 mg (100 mg Oral Given 03/10/16 1129)  vancomycin (VANCOCIN) IVPB 750 mg/150 ml premix (750 mg Intravenous Given 03/10/16 1306)  ceFEPIme (MAXIPIME) 2 g in dextrose 5 % 50 mL IVPB (0 g Intravenous Stopped 03/10/16 1306)  albuterol (PROVENTIL) (2.5 MG/3ML) 0.083% nebulizer solution 5 mg (5 mg Nebulization Given 03/10/16 1142)     Initial Impression / Assessment and Plan / ED Course  I have reviewed  the triage vital signs and the nursing notes.  Pertinent labs & imaging results that were available during my care of the patient were reviewed by me and considered  in my medical decision making (see chart for details).  Clinical Course   BP 127/75 (BP Location: Right Arm)   Pulse 88   Temp 99.8 F (37.7 C) (Oral)   Resp (!) 22   SpO2 91%    Final Clinical Impressions(s) / ED Diagnoses   Final diagnoses:  HCAP (healthcare-associated pneumonia)  Shortness of breath  Patient on antineoplastic chemotherapy regimen    New Prescriptions New Prescriptions   No medications on file   10:46 AM Patient here with fever chills productive cough and shortness of breath. Chest x-ray shows bilateral opacity concerning for either atelectasis or pneumonia. I suspect this is pneumonia. She is currently immunocompromise as she is receiving chemotherapy. She has been on Levaquin without adequate improvement. She will need to be admitted for further management of her infection. She is allergic to multiple medications and therefore she will be treated with aztreonam and vancomycin via IVs. she has normal lactic acid, afebrile here, her vital signs stable. Code Sepsis is not indicated at this time.  Care discussed with Dr. Laverta Baltimore.   1:24 PM Appreciate consultation from [facility] resident who request management of pt at [facility] due to her cancer hx.  I have consulted [facility] Dr. Cruzita Lederer who agrees to admit pt to telemetry, under his care.  Pt agrees with plan.     Domenic Moras, PA-C 03/10/16 [place], MD 03/10/16 [phone]

## 2016-03-10 NOTE — ED Triage Notes (Signed)
Patient with hx of multiple myeloma, c/o productive cough with clear to milky mucous x several weeks, nasal drainage, pain with respirations, back pain x several weeks, mild nausea, chills. Last dose chemo was Friday.

## 2016-03-11 ENCOUNTER — Inpatient Hospital Stay (HOSPITAL_COMMUNITY): Payer: Medicare Other

## 2016-03-11 DIAGNOSIS — I2510 Atherosclerotic heart disease of native coronary artery without angina pectoris: Secondary | ICD-10-CM

## 2016-03-11 DIAGNOSIS — R509 Fever, unspecified: Secondary | ICD-10-CM

## 2016-03-11 DIAGNOSIS — C9002 Multiple myeloma in relapse: Secondary | ICD-10-CM

## 2016-03-11 DIAGNOSIS — D701 Agranulocytosis secondary to cancer chemotherapy: Secondary | ICD-10-CM

## 2016-03-11 DIAGNOSIS — R0602 Shortness of breath: Secondary | ICD-10-CM

## 2016-03-11 DIAGNOSIS — J9601 Acute respiratory failure with hypoxia: Secondary | ICD-10-CM

## 2016-03-11 DIAGNOSIS — D702 Other drug-induced agranulocytosis: Secondary | ICD-10-CM | POA: Insufficient documentation

## 2016-03-11 LAB — URINE CULTURE: CULTURE: NO GROWTH

## 2016-03-11 LAB — COMPREHENSIVE METABOLIC PANEL
ALBUMIN: 2.6 g/dL — AB (ref 3.5–5.0)
ALK PHOS: 58 U/L (ref 38–126)
ALT: 39 U/L (ref 14–54)
ANION GAP: 3 — AB (ref 5–15)
AST: 28 U/L (ref 15–41)
BILIRUBIN TOTAL: 1.4 mg/dL — AB (ref 0.3–1.2)
BUN: 15 mg/dL (ref 6–20)
CALCIUM: 7.7 mg/dL — AB (ref 8.9–10.3)
CO2: 27 mmol/L (ref 22–32)
CREATININE: 0.8 mg/dL (ref 0.44–1.00)
Chloride: 101 mmol/L (ref 101–111)
GFR calc non Af Amer: >60 mL/min (ref >60)
GLUCOSE: 103 mg/dL — AB (ref 65–99)
Potassium: 3.4 mmol/L — ABNORMAL LOW (ref 3.5–5.1)
SODIUM: 131 mmol/L — AB (ref 135–145)
TOTAL PROTEIN: 5.8 g/dL — AB (ref 6.5–8.1)

## 2016-03-11 LAB — EXPECTORATED SPUTUM ASSESSMENT W REFEX TO RESP CULTURE

## 2016-03-11 LAB — CBC WITH DIFFERENTIAL/PLATELET
BASOS ABS: 0 10*3/uL (ref 0.0–0.1)
BASOS PCT: 0 %
EOS ABS: 0.1 10*3/uL (ref 0.0–0.7)
Eosinophils Relative: 9 %
HCT: 31.4 % — ABNORMAL LOW (ref 36.0–46.0)
Hemoglobin: 10.8 g/dL — ABNORMAL LOW (ref 12.0–15.0)
Lymphocytes Relative: 6 %
Lymphs Abs: 0.1 10*3/uL — ABNORMAL LOW (ref 0.7–4.0)
MCH: 35 pg — AB (ref 26.0–34.0)
MCHC: 34.4 g/dL (ref 30.0–36.0)
MCV: 101.6 fL — AB (ref 78.0–100.0)
MONO ABS: 0.1 10*3/uL (ref 0.1–1.0)
Monocytes Relative: 4 %
NEUTROS ABS: 1 10*3/uL — AB (ref 1.7–7.7)
Neutrophils Relative %: 81 %
PLATELETS: 57 10*3/uL — AB (ref 150–400)
RBC: 3.09 MIL/uL — ABNORMAL LOW (ref 3.87–5.11)
RDW: 15.7 % — AB (ref 11.5–15.5)
WBC: 1.3 10*3/uL — CL (ref 4.0–10.5)

## 2016-03-11 LAB — HIV ANTIBODY (ROUTINE TESTING W REFLEX): HIV Screen 4th Generation wRfx: NONREACTIVE

## 2016-03-11 LAB — TROPONIN I: Troponin I: <0.03 ng/mL (ref <0.03)

## 2016-03-11 LAB — MULTIPLE MYELOMA PANEL, SERUM
ALBUMIN/GLOB SERPL: 1 (ref 0.7–1.7)
ALPHA 1: 0.3 g/dL (ref 0.0–0.4)
ALPHA2 GLOB SERPL ELPH-MCNC: 0.7 g/dL (ref 0.4–1.0)
Albumin SerPl Elph-Mcnc: 3.3 g/dL (ref 2.9–4.4)
B-Globulin SerPl Elph-Mcnc: 0.8 g/dL (ref 0.7–1.3)
GAMMA GLOB SERPL ELPH-MCNC: 1.7 g/dL (ref 0.4–1.8)
GLOBULIN, TOTAL: 3.5 g/dL (ref 2.2–3.9)
IGA/IMMUNOGLOBULIN A, SERUM: 98 mg/dL (ref 64–422)
IGM (IMMUNOGLOBIN M), SRM: 40 mg/dL (ref 26–217)
M Protein SerPl Elph-Mcnc: 1.2 g/dL — ABNORMAL HIGH
Total Protein: 6.8 g/dL (ref 6.0–8.5)

## 2016-03-11 LAB — LACTIC ACID, PLASMA
LACTIC ACID, VENOUS: 1.4 mmol/L (ref 0.5–1.9)
Lactic Acid, Venous: 1.3 mmol/L (ref 0.5–1.9)

## 2016-03-11 LAB — PROCALCITONIN: Procalcitonin: 1.42 ng/mL

## 2016-03-11 LAB — PROTIME-INR
INR: 1.33
Prothrombin Time: 16.6 seconds — ABNORMAL HIGH (ref 11.4–15.2)

## 2016-03-11 LAB — APTT: aPTT: 39 seconds — ABNORMAL HIGH (ref 24–36)

## 2016-03-11 LAB — EXPECTORATED SPUTUM ASSESSMENT W GRAM STAIN, RFLX TO RESP C

## 2016-03-11 MED ORDER — FUROSEMIDE 10 MG/ML IJ SOLN
INTRAMUSCULAR | Status: AC
  Administered 2016-03-11: 20 mg via INTRAVENOUS
  Filled 2016-03-11: qty 2

## 2016-03-11 MED ORDER — FILGRASTIM 300 MCG/ML IJ SOLN
300.0000 ug | Freq: Once | INTRAMUSCULAR | Status: AC
  Administered 2016-03-11: 300 ug via SUBCUTANEOUS
  Filled 2016-03-11: qty 1

## 2016-03-11 MED ORDER — SODIUM CHLORIDE 0.9 % IV BOLUS (SEPSIS)
1000.0000 mL | Freq: Once | INTRAVENOUS | Status: AC
  Administered 2016-03-11: 1000 mL via INTRAVENOUS

## 2016-03-11 MED ORDER — IPRATROPIUM-ALBUTEROL 0.5-2.5 (3) MG/3ML IN SOLN
3.0000 mL | RESPIRATORY_TRACT | Status: DC
  Administered 2016-03-11: 3 mL via RESPIRATORY_TRACT
  Filled 2016-03-11: qty 3

## 2016-03-11 MED ORDER — POTASSIUM CHLORIDE CRYS ER 20 MEQ PO TBCR
40.0000 meq | EXTENDED_RELEASE_TABLET | Freq: Once | ORAL | Status: AC
  Administered 2016-03-11: 40 meq via ORAL
  Filled 2016-03-11: qty 2

## 2016-03-11 MED ORDER — FUROSEMIDE 10 MG/ML IJ SOLN: 40.0000 mg | Freq: Once | INTRAMUSCULAR | Status: DC

## 2016-03-11 MED ORDER — FUROSEMIDE 10 MG/ML IJ SOLN
60.0000 mg | Freq: Once | INTRAMUSCULAR | Status: DC
  Filled 2016-03-11: qty 6

## 2016-03-11 MED ORDER — FUROSEMIDE 10 MG/ML IJ SOLN
20.0000 mg | Freq: Once | INTRAMUSCULAR | Status: AC
  Administered 2016-03-11: 20 mg via INTRAVENOUS

## 2016-03-11 MED ORDER — GUAIFENESIN ER 600 MG PO TB12
600.0000 mg | ORAL_TABLET | Freq: Two times a day (BID) | ORAL | Status: DC
  Administered 2016-03-11 – 2016-03-18 (×15): 600 mg via ORAL
  Filled 2016-03-11 (×16): qty 1

## 2016-03-11 MED ORDER — SODIUM CHLORIDE 0.9 % IV BOLUS (SEPSIS)
2000.0000 mL | Freq: Once | INTRAVENOUS | Status: AC
  Administered 2016-03-11: 2000 mL via INTRAVENOUS

## 2016-03-11 MED ORDER — SODIUM CHLORIDE 0.9 % IV BOLUS (SEPSIS)
500.0000 mL | Freq: Once | INTRAVENOUS | Status: AC
Start: 2016-03-11 — End: 2016-03-11
  Administered 2016-03-11: 500 mL via INTRAVENOUS

## 2016-03-11 MED ORDER — ALBUTEROL SULFATE (2.5 MG/3ML) 0.083% IN NEBU: 2.5000 mg | INHALATION_SOLUTION | RESPIRATORY_TRACT | Status: DC | PRN

## 2016-03-11 MED ORDER — IPRATROPIUM-ALBUTEROL 0.5-2.5 (3) MG/3ML IN SOLN
3.0000 mL | Freq: Three times a day (TID) | RESPIRATORY_TRACT | Status: DC
  Administered 2016-03-12 – 2016-03-14 (×8): 3 mL via RESPIRATORY_TRACT
  Filled 2016-03-11 (×9): qty 3

## 2016-03-11 MED ORDER — SODIUM CHLORIDE 0.9 % IV BOLUS (SEPSIS)
250.0000 mL | Freq: Once | INTRAVENOUS | Status: AC
Start: 2016-03-11 — End: 2016-03-11
  Administered 2016-03-11: 250 mL via INTRAVENOUS

## 2016-03-11 MED ORDER — SODIUM CHLORIDE 0.9 % IV SOLN: INTRAVENOUS | Status: DC

## 2016-03-11 NOTE — Progress Notes (Signed)
WBC 1.3, called to on-call provider. No new orders received. Patient placed on protective precautions per protocol. Will continue to monitor and follow the POC.

## 2016-03-11 NOTE — Progress Notes (Addendum)
PROGRESS NOTE                                                                                                                                                                                                             Patient Demographics:    Shannon Rush, is a 75 y.o. female, DOB - 03-28-1941, WEX:[id number]  Admit date - 03/10/2016   Admitting Physician Costin Karlyne Greenspan, MD  Outpatient Primary MD for the patient is [name], MD  LOS - 1  Outpatient Specialists: Dr Burr Medico  Chief Complaint  Patient presents with  . Shortness of Breath    Immunocompromise       Brief Narrative   75 y/o  female with history of multiple myeloma on chemotherapy, coronary artery disease with history of STE MI in August 2016 status post thrombolytic, hyperlipidemia, osteoporosis presented to the ED with shortness of breath for the past 5-6 days associated with chest congestion, productive cough and chills. He saw her PCP 3 days back and was prescribed Levaquin without any improvement. In the ED she was hypoxic requiring oxygen, had low-grade fever, tachypnea with pancytopenia. Chest x-ray showed bibasilar opacity suggestive of pneumonia and was admitted to hospitalist service. This morning she is found to be in sepsis with fever of 102.8 F, hypotensive , hypoxic and pancytopenic.   Subjective:  Patient feels tired, still has cough. Shortness of breath much better.   Assessment  & Plan :    Principal problem Sepsis ([place]) Secondary to healthcare associated pneumonia with associated hypoxic respiratory failure. Sepsis pathway initiated. Aggressive IV hydration. lactic acid normal. Follow blood  and sputum cultures. Empiric IV vancomycin and cefepime. Supportive care with antitussives and Tylenol..   Active Problems: Acute hypoxic respiratory failure Secondary to HCAP. Continue O2 via nasal cannula and  antibiotics.  Pancytopenia ([place]) Secondary to recent chemotherapy. Will ask Dr Burr Medico if pt could benefit from neupogen.     Multiple myeloma ([facility]) Currently on pomalidomide and daratumumab. Follows with Dr Burr Medico    Coronary atherosclerosis of native coronary artery Continue Toprol. hold Plavix given thrombocytopenia    Protein-calorie malnutrition [facility])  consult dietitian.   Hypokalemia Replenish      Code Status : Full code  Family Communication  : Husband at bedside  Disposition Plan  : Home possibly 48 hours if sepsis resolved and  cultures negative  Barriers For Discharge : Active symptoms  Consults  : Dr Burr Medico  Procedures  : None  DVT Prophylaxis  :  Lovenox -   Lab Results  Component Value Date   PLT 57 (L) 03/11/2016    Antibiotics  :    Anti-infectives    Start     Dose/Rate Route Frequency Ordered Stop   03/11/16 0000  vancomycin (VANCOCIN) IVPB 750 mg/150 ml premix     750 mg 150 mL/hr over 60 Minutes Intravenous Every 12 hours 03/10/16 1205     03/10/16 2200  acyclovir (ZOVIRAX) tablet 400 mg     400 mg Oral 2 times daily 03/10/16 1511     03/10/16 2000  ceFEPIme (MAXIPIME) 1 g in dextrose 5 % 50 mL IVPB     1 g 100 mL/hr over 30 Minutes Intravenous Every 8 hours 03/10/16 1307     03/10/16 1400  aztreonam (AZACTAM) 2 g in dextrose 5 % 50 mL IVPB  Status:  Discontinued     2 g 100 mL/hr over 30 Minutes Intravenous Every 8 hours 03/10/16 1341 03/10/16 1344   03/10/16 1115  ceFEPIme (MAXIPIME) 2 g in dextrose 5 % 50 mL IVPB     2 g 100 mL/hr over 30 Minutes Intravenous STAT 03/10/16 1100 03/10/16 1306   03/10/16 1100  vancomycin (VANCOCIN) IVPB 750 mg/150 ml premix     750 mg 150 mL/hr over 60 Minutes Intravenous STAT 03/10/16 1057 03/10/16 1406   03/10/16 1045  aztreonam (AZACTAM) 2 g in dextrose 5 % 50 mL IVPB  Status:  Discontinued     2 g 100 mL/hr over 30 Minutes Intravenous  Once 03/10/16 1042 03/10/16 1100        Objective:   Vitals:    03/10/16 1447 03/10/16 2031 03/11/16 0517 03/11/16 0825  BP:  103/62 (!) 93/48 (!) 86/43  Pulse:  94 90 84  Resp:  _0 Temp:  (!) 102.1 F (38.9 C) (!) 102.8 F (39.3 C) 97.5 F (36.4 C)  TempSrc:  Oral Oral Oral  SpO2:  95% 93% 96%  Weight: 56.5 kg (124 lb 9 oz)     Height: 5' 6" (1.676 m)       Wt Readings from Last 3 Encounters:  03/10/16 56.5 kg (124 lb 9 oz)  03/07/16 56.5 kg (124 lb 9.6 oz)  02/15/16 55.6 kg (122 lb 9.6 oz)     Intake/Output Summary (Last 24 hours) at 03/11/16 1150 Last data filed at 03/11/16 0346  Gross per 24 hour  Intake              400 ml  Output             1500 ml  Net            -1100 ml     Physical Exam  Gen: not in distress HEENT: no pallor, Dry mucosa, supple neck Chest: Diminished bibasilar breath sounds CVS: N S1&S2, no murmurs, rubs or gallop GI: soft, NT, ND, BS+ Musculoskeletal: warm, no edema CNS: Alert and oriented    Data Review:    CBC  Recent Labs Lab 03/07/16 0927 03/10/16 1005 03/11/16 0236  WBC 6.4 2.9* 1.3*  HGB 9.5* 11.4* 10.8*  HCT 28.7* 33.3* 31.4*  PLT 155 81* 57*  MCV 104.8* 102.1* 101.6*  MCH 34.8* 35.0* 35.0*  MCHC 33.2 34.2 34.4  RDW 15.4* 15.5 15.7*  LYMPHSABS 0.5* 0.3* 0.1*  MONOABS 0.8  0.2 0.1  EOSABS 0.0 0.3 0.1  BASOSABS 0.0 0.0 0.0    Chemistries   Recent Labs Lab 03/07/16 0927 03/10/16 1005 03/11/16 0236  NA 139 134* 131*  K 3.7 3.4* 3.4*  CL  --  103 101  CO2 _0 GLUCOSE 72 97 103*  BUN 26.8* 15 15  CREATININE 0.9 0.81 0.80  CALCIUM 9.1 8.3* 7.7*  AST _1 ALT 27 43 39  ALKPHOS 81 67 58  BILITOT 0.53 1.1 1.4*   ------------------------------------------------------------------------------------------------------------------ No results for input(s): CHOL, HDL, LDLCALC, TRIG, CHOLHDL, LDLDIRECT in the last 72 hours.  No results found for:  HGBA1C ------------------------------------------------------------------------------------------------------------------ No results for input(s): TSH, T4TOTAL, T3FREE, THYROIDAB in the last 72 hours.  Invalid input(s): FREET3 ------------------------------------------------------------------------------------------------------------------ No results for input(s): VITAMINB12, FOLATE, FERRITIN, TIBC, IRON, RETICCTPCT in the last 72 hours.  Coagulation profile  Recent Labs Lab 03/11/16 0855  INR 1.33    No results for input(s): DDIMER in the last 72 hours.  Cardiac Enzymes  Recent Labs Lab 03/10/16 1451 03/10/16 2006 03/11/16 0236  TROPONINI <0.03 <0.03 <0.03   ------------------------------------------------------------------------------------------------------------------ No results found for: BNP  Inpatient Medications  Scheduled Meds: . acyclovir  400 mg Oral BID  . atorvastatin  40 mg Oral Daily  . benzonatate  100 mg Oral Once  . calcium carbonate  1,250 mg Oral Daily  . ceFEPime (MAXIPIME) IV  1 g Intravenous Q8H  . enoxaparin (LOVENOX) injection  40 mg Subcutaneous Q24H  . fluticasone  2 spray Each Nare Daily  . metoprolol succinate  25 mg Oral Daily  . vancomycin  750 mg Intravenous Q12H   Continuous Infusions:  PRN Meds:.acetaminophen, guaiFENesin-dextromethorphan, ipratropium-albuterol, morphine, ondansetron, prochlorperazine, zolpidem  Micro Results No results found for this or any previous visit (from the past 240 hour(s)).  Radiology Reports Dg Chest 2 View  Result Date: 03/10/2016 CLINICAL DATA:  Cough and shortness of breath.  Chest pain EXAM: CHEST  2 VIEW COMPARISON:  11/02/2014 FINDINGS: Normal heart size. Advanced chronic interstitial lung changes are again identified. Superimposed bibasilar opacities are identified. There are small pleural effusions present. Chronic multi level compression deformities are identified within the thoracic spine.  IMPRESSION: 1. Bibasilar opacities which may represent atelectasis and/or pneumonia. 2. Small bilateral pleural effusions. Electronically Signed   By: Kerby Moors M.D.   On: 03/10/2016 10:25    Time Spent in minutes  35   Louellen Molder M.D on 03/11/2016 at 11:50 AM  Between 7am to 7pm - Pager - [phone]  After 7pm go to [email or web address] - password [facility]  [facility] -  Office  [phone]

## 2016-03-11 NOTE — Progress Notes (Signed)
Shannon Rush   DOB:Oct 13, 1940   GY#:[id number]   [phone]  Hematology and Oncology follow up note  Subjective: Pt is well-known to me, recently started pomalidomide, dexamethasone and daratumumab for her relapsed multiple myeloma. She has been having productive cough and congestion for the past 2 weeks, was admitted yesterday for fever and hypoxia. She feels slightly better today, was hypotensive this morning, received IV fluids, blood pressure normalizes dissection on. She is on nasal oxygen for liter per minute, pulse ox was 89% when I saw her on the floor.    Objective:  Vitals:   03/11/16 1606 03/11/16 1723  BP: (!) 142/61   Pulse: 100   Resp: (!) 22   Temp: (!) 100.6 F (38.1 C) 100.2 F (37.9 C)    Body mass index is 20.1 kg/m.  Intake/Output Summary (Last 24 hours) at 03/11/16 1744 Last data filed at 03/11/16 1700  Gross per 24 hour  Intake             1500 ml  Output             2100 ml  Net             -600 ml     Sclerae unicteric  Oropharynx clear  No peripheral adenopathy  Lungs clear -- no rales or rhonchi  Heart regular rate and rhythm  Abdomen benign  MSK no focal spinal tenderness, no peripheral edema  Neuro nonfocal    CBG (last 3)  No results for input(s): GLUCAP in the last 72 hours.   Labs:  Lab Results  Component Value Date   WBC 1.3 (LL) 03/11/2016   HGB 10.8 (L) 03/11/2016   HCT 31.4 (L) 03/11/2016   MCV 101.6 (H) 03/11/2016   PLT 57 (L) 03/11/2016   NEUTROABS 1.0 (L) 03/11/2016    CMP Latest Ref Rng & Units 03/11/2016 03/10/2016 03/07/2016  Glucose 65 - 99 mg/dL 103(H) 97 72  BUN 6 - 20 mg/dL 15 15 26.8(H)  Creatinine 0.44 - 1.00 mg/dL 0.80 0.81 0.9  Sodium 135 - 145 mmol/L 131(L) 134(L) 139  Potassium 3.5 - 5.1 mmol/L 3.4(L) 3.4(L) 3.7  Chloride 101 - 111 mmol/L 101 103 -  CO2 22 - 32 mmol/L 27 26 24   Calcium 8.9 - 10.3 mg/dL 7.7(L) 8.3(L) 9.1  Total Protein 6.5 - 8.1 g/dL 5.8(L) 6.9 6.8  Total Bilirubin 0.3 - 1.2 mg/dL 1.4(H)  1.1 0.53  Alkaline Phos 38 - 126 U/L 58 67 81  AST 15 - 41 U/L 28 31 31   ALT 14 - 54 U/L 39 43 27     Urine Studies No results for input(s): UHGB, CRYS in the last 72 hours.  Invalid input(s): UACOL, UAPR, USPG, UPH, UTP, UGL, UKET, UBIL, UNIT, UROB, ULEU, UEPI, UWBC, URBC, UBAC, CAST, [place], [place]  Basic Metabolic Panel:  Recent Labs Lab 03/07/16 0927 03/10/16 1005 03/11/16 0236  NA 139 134* 131*  K 3.7 3.4* 3.4*  CL  --  103 101  CO2 24 26 27   GLUCOSE 72 97 103*  BUN 26.8* 15 15  CREATININE 0.9 0.81 0.80  CALCIUM 9.1 8.3* 7.7*   GFR Estimated Creatinine Clearance: 55.9 mL/min (by C-G formula based on SCr of 0.8 mg/dL). Liver Function Tests:  Recent Labs Lab 03/07/16 0927 03/07/16 0928 03/10/16 1005 03/11/16 0236  AST 31  --  31 28  ALT 27  --  43 39  ALKPHOS 81  --  67 58  BILITOT 0.53  --  1.1 1.4*  PROT 7.5 6.8 6.9 5.8*  ALBUMIN 3.1*  --  3.3* 2.6*   No results for input(s): LIPASE, AMYLASE in the last 168 hours. No results for input(s): AMMONIA in the last 168 hours. Coagulation profile  Recent Labs Lab 03/11/16 0855  INR 1.33    CBC:  Recent Labs Lab 03/07/16 0927 03/10/16 1005 03/11/16 0236  WBC 6.4 2.9* 1.3*  NEUTROABS 5.0 2.1 1.0*  HGB 9.5* 11.4* 10.8*  HCT 28.7* 33.3* 31.4*  MCV 104.8* 102.1* 101.6*  PLT 155 81* 57*   Cardiac Enzymes:  Recent Labs Lab 03/10/16 1451 03/10/16 2006 03/11/16 0236  TROPONINI <0.03 <0.03 <0.03   BNP: Invalid input(s): POCBNP CBG: No results for input(s): GLUCAP in the last 168 hours. D-Dimer No results for input(s): DDIMER in the last 72 hours. Hgb A1c No results for input(s): HGBA1C in the last 72 hours. Lipid Profile No results for input(s): CHOL, HDL, LDLCALC, TRIG, CHOLHDL, LDLDIRECT in the last 72 hours. Thyroid function studies No results for input(s): TSH, T4TOTAL, T3FREE, THYROIDAB in the last 72 hours.  Invalid input(s): FREET3 Anemia work up No results for input(s): VITAMINB12,  FOLATE, FERRITIN, TIBC, IRON, RETICCTPCT in the last 72 hours. Microbiology Recent Results (from the past 240 hour(s))  Urine culture     Status: None   Collection Time: 03/10/16  9:54 AM  Result Value Ref Range Status   Specimen Description URINE, RANDOM  Final   Special Requests NONE  Final   Culture NO GROWTH Performed at [facility]   Final   Report Status 03/11/2016 FINAL  Final  Culture, sputum-assessment     Status: None   Collection Time: 03/11/16  4:24 PM  Result Value Ref Range Status   Specimen Description SPUTUM  Final   Special Requests NONE  Final   Sputum evaluation   Final    THIS SPECIMEN IS ACCEPTABLE. RESPIRATORY CULTURE REPORT TO FOLLOW.   Report Status 03/11/2016 FINAL  Final      Studies:  Dg Chest 2 View  Result Date: 03/10/2016 CLINICAL DATA:  Cough and shortness of breath.  Chest pain EXAM: CHEST  2 VIEW COMPARISON:  11/02/2014 FINDINGS: Normal heart size. Advanced chronic interstitial lung changes are again identified. Superimposed bibasilar opacities are identified. There are small pleural effusions present. Chronic multi level compression deformities are identified within the thoracic spine. IMPRESSION: 1. Bibasilar opacities which may represent atelectasis and/or pneumonia. 2. Small bilateral pleural effusions. Electronically Signed   By: Kerby Moors M.D.   On: 03/10/2016 10:25    Assessment: 75 y.o. female with history of multiple myeloma status post autotransplant in August 2014, recently had disease relapse, and switched to new chemotherapy treatment.  1. HCAP with hypoxic respiratory failure 2. Sepsis with hypotension, improved with IVF 3. Multiple myeloma, relapsed in July 2017, on new chemo  4. Pancytopenia due to chemotherapy 5. Coronary artery disease, history of STEMI in August 2015  Plan:  -Agree with broad antibiotics vancomycin and cefepime -Aggressive supportive care with IV fluids, bronchodilator, etc -due to her history  of heart disease, need to be careful about I/O and fluid overload  -I will hold all MM treatment for now (Pomalidomide,  dexamethasone and Daratumumab) -I will order Granix 300 g im daily until neutropenia resolves  -consider blood transfusion if Hb<7.5 or Plt <20K  -I will follow up closely  -If her VS unstable, may need ICU care   I appreciate excellent care from the hospitalist team, I  spoke with Dr. Clementeen Graham today.   [name], MD 03/11/2016  5:44 PM

## 2016-03-12 ENCOUNTER — Telehealth: Payer: Self-pay | Admitting: *Deleted

## 2016-03-12 LAB — BASIC METABOLIC PANEL
Anion gap: 3 — ABNORMAL LOW (ref 5–15)
BUN: 13 mg/dL (ref 6–20)
CHLORIDE: 108 mmol/L (ref 101–111)
CO2: 24 mmol/L (ref 22–32)
CREATININE: 0.66 mg/dL (ref 0.44–1.00)
Calcium: 7.3 mg/dL — ABNORMAL LOW (ref 8.9–10.3)
GFR calc Af Amer: >60 mL/min (ref >60)
GFR calc non Af Amer: >60 mL/min (ref >60)
Glucose, Bld: 111 mg/dL — ABNORMAL HIGH (ref 65–99)
Potassium: 3.2 mmol/L — ABNORMAL LOW (ref 3.5–5.1)
SODIUM: 135 mmol/L (ref 135–145)

## 2016-03-12 LAB — CBC WITH DIFFERENTIAL/PLATELET
BASOS ABS: 0 10*3/uL (ref 0.0–0.1)
Basophils Relative: 0 %
Eosinophils Absolute: 0.1 10*3/uL (ref 0.0–0.7)
Eosinophils Relative: 15 %
HEMATOCRIT: 26.1 % — AB (ref 36.0–46.0)
HEMOGLOBIN: 9 g/dL — AB (ref 12.0–15.0)
LYMPHS ABS: 0.1 10*3/uL — AB (ref 0.7–4.0)
Lymphocytes Relative: 9 %
MCH: 34.9 pg — AB (ref 26.0–34.0)
MCHC: 34.5 g/dL (ref 30.0–36.0)
MCV: 101.2 fL — ABNORMAL HIGH (ref 78.0–100.0)
MONO ABS: 0 10*3/uL — AB (ref 0.1–1.0)
MONOS PCT: 4 %
NEUTROS ABS: 0.6 10*3/uL — AB (ref 1.7–7.7)
Neutrophils Relative %: 72 %
Platelets: 31 10*3/uL — ABNORMAL LOW (ref 150–400)
RBC: 2.58 MIL/uL — ABNORMAL LOW (ref 3.87–5.11)
RDW: 15.6 % — ABNORMAL HIGH (ref 11.5–15.5)
WBC: 0.8 10*3/uL — CL (ref 4.0–10.5)

## 2016-03-12 LAB — VANCOMYCIN, TROUGH: Vancomycin Tr: 10 ug/mL — ABNORMAL LOW (ref 15–20)

## 2016-03-12 MED ORDER — FUROSEMIDE 10 MG/ML IJ SOLN
20.0000 mg | Freq: Once | INTRAMUSCULAR | Status: AC
  Administered 2016-03-12: 20 mg via INTRAVENOUS
  Filled 2016-03-12: qty 2

## 2016-03-12 MED ORDER — VANCOMYCIN HCL IN DEXTROSE 1-5 GM/200ML-% IV SOLN
1000.0000 mg | Freq: Two times a day (BID) | INTRAVENOUS | Status: DC
  Administered 2016-03-12 – 2016-03-13 (×3): 1000 mg via INTRAVENOUS
  Filled 2016-03-12 (×4): qty 200

## 2016-03-12 MED ORDER — VANCOMYCIN HCL IN DEXTROSE 1-5 GM/200ML-% IV SOLN
1000.0000 mg | INTRAVENOUS | Status: AC
  Administered 2016-03-12: 1000 mg via INTRAVENOUS
  Filled 2016-03-12: qty 200

## 2016-03-12 MED ORDER — TBO-FILGRASTIM 300 MCG/0.5ML ~~LOC~~ SOSY
300.0000 ug | PREFILLED_SYRINGE | Freq: Once | SUBCUTANEOUS | Status: AC
  Administered 2016-03-12: 300 ug via SUBCUTANEOUS
  Filled 2016-03-12: qty 0.5

## 2016-03-12 MED ORDER — POTASSIUM CHLORIDE CRYS ER 20 MEQ PO TBCR
40.0000 meq | EXTENDED_RELEASE_TABLET | Freq: Once | ORAL | Status: AC
  Administered 2016-03-12: 40 meq via ORAL
  Filled 2016-03-12: qty 2

## 2016-03-12 MED ORDER — TBO-FILGRASTIM 300 MCG/0.5ML ~~LOC~~ SOSY
300.0000 ug | PREFILLED_SYRINGE | Freq: Every day | SUBCUTANEOUS | Status: DC
  Administered 2016-03-13 – 2016-03-14 (×2): 300 ug via SUBCUTANEOUS
  Filled 2016-03-12 (×2): qty 0.5

## 2016-03-12 MED ORDER — DEXTROSE 5 % IV SOLN
2.0000 g | Freq: Three times a day (TID) | INTRAVENOUS | Status: DC
  Administered 2016-03-12 – 2016-03-22 (×30): 2 g via INTRAVENOUS
  Filled 2016-03-12 (×32): qty 2

## 2016-03-12 NOTE — Progress Notes (Signed)
PROGRESS NOTE                                                                                                                                                                                                             Patient Demographics:    Shannon Rush, is a 75 y.o. female, DOB - 06/05/1941, EVO:[id number]  Admit date - 03/10/2016   Admitting Physician Costin Karlyne Greenspan, MD  Outpatient Primary MD for the patient is [name], MD  LOS - 2  Outpatient Specialists: Dr Burr Medico  Chief Complaint  Patient presents with  . Shortness of Breath    Immunocompromise       Brief Narrative   75 y/o  female with history of multiple myeloma on chemotherapy, coronary artery disease with history of STE MI in August 2016 status post thrombolytic, hyperlipidemia, osteoporosis presented to the ED with shortness of breath for the past 5-6 days associated with chest congestion, productive cough and chills. He saw her PCP 3 days back and was prescribed Levaquin without any improvement. Patient admitted with sepsis and pancytopenia secondary to lobar pneumonia.   Subjective:   Sepsis pathway initiated yesterday morning since she was febrile and hypotensive and received IV fluid bolus. During the evening patient was requiring more oxygen (5 L). Stop IV fluids and given 20 mg IV Lasix with good diuresis. Currently maintaining O2 sat on 3.5 L. MAXIMUM TEMPERATURE of 100.37F   Assessment  & Plan :    Principal problem Sepsis ([place]) Secondary to healthcare associated pneumonia with associated hypoxic respiratory failure. Received aggressive IV hydration. Blood pressure now improved. Continue empiric vancomycin and cefepime. Culture so far negative.    Active Problems: Acute hypoxic respiratory failure Secondary to HCAP and mild fluid overload. Will order another for 20 mg IV Lasix. Titrate oxygen as tolerated.  Pancytopenia  ([place]) Secondary to recent chemotherapy. Dr Burr Medico recommends daily IV granix until neutropenia resolved. ( 1st dose on 8/28) Monitor CBC daily. Maintain neutropenic precautions.     Multiple myeloma (H[facility]) Currently on pomalidomide and daratumumab. Follows with Dr Burr Medico    Coronary atherosclerosis of native coronary artery Continue Toprol. hold Plavix given thrombocytopenia    Protein-calorie malnutrition [facility])  consult dietitian.   Hypokalemia Replenished      Code Status : Full code  Family Communication  : Husband at bedside  Disposition Plan  : Home possibly 48-72  hours if sepsis resolved and cultures negative  Barriers For Discharge : Active symptoms  Consults  : Dr Burr Medico  Procedures  : None  DVT Prophylaxis  :  SCDs  Lab Results  Component Value Date   PLT 31 (L) 03/12/2016    Antibiotics  :    Anti-infectives    Start     Dose/Rate Route Frequency Ordered Stop   03/12/16 1245  vancomycin (VANCOCIN) IVPB 1000 mg/200 mL premix     1,000 mg 200 mL/hr over 60 Minutes Intravenous STAT 03/12/16 1233 03/13/16 1245   03/12/16 1200  ceFEPIme (MAXIPIME) 2 g in dextrose 5 % 50 mL IVPB     2 g 100 mL/hr over 30 Minutes Intravenous Every 8 hours 03/12/16 0843     03/11/16 0000  vancomycin (VANCOCIN) IVPB 750 mg/150 ml premix  Status:  Discontinued     750 mg 150 mL/hr over 60 Minutes Intravenous Every 12 hours 03/10/16 1205 03/12/16 1233   03/10/16 2200  acyclovir (ZOVIRAX) tablet 400 mg     400 mg Oral 2 times daily 03/10/16 1511     03/10/16 2000  ceFEPIme (MAXIPIME) 1 g in dextrose 5 % 50 mL IVPB  Status:  Discontinued     1 g 100 mL/hr over 30 Minutes Intravenous Every 8 hours 03/10/16 1307 03/12/16 0843   03/10/16 1400  aztreonam (AZACTAM) 2 g in dextrose 5 % 50 mL IVPB  Status:  Discontinued     2 g 100 mL/hr over 30 Minutes Intravenous Every 8 hours 03/10/16 1341 03/10/16 1344   03/10/16 1115  ceFEPIme (MAXIPIME) 2 g in dextrose 5 % 50 mL IVPB     2 g 100  mL/hr over 30 Minutes Intravenous STAT 03/10/16 1100 03/10/16 1306   03/10/16 1100  vancomycin (VANCOCIN) IVPB 750 mg/150 ml premix     750 mg 150 mL/hr over 60 Minutes Intravenous STAT 03/10/16 1057 03/10/16 1406   03/10/16 1045  aztreonam (AZACTAM) 2 g in dextrose 5 % 50 mL IVPB  Status:  Discontinued     2 g 100 mL/hr over 30 Minutes Intravenous  Once 03/10/16 1042 03/10/16 1100        Objective:   Vitals:   03/12/16 0014 03/12/16 0408 03/12/16 0834 03/12/16 0838  BP: (!) 105/53 (!) 116/51    Pulse: 87 98    Resp: 20     Temp: 98.4 F (36.9 C) 99.6 F (37.6 C)    TempSrc: Oral Oral    SpO2: 93% 94% 97% 94%  Weight:      Height:        Wt Readings from Last 3 Encounters:  03/10/16 56.5 kg (124 lb 9 oz)  03/07/16 56.5 kg (124 lb 9.6 oz)  02/15/16 55.6 kg (122 lb 9.6 oz)     Intake/Output Summary (Last 24 hours) at 03/12/16 1242 Last data filed at 03/12/16 0407  Gross per 24 hour  Intake             1040 ml  Output             3200 ml  Net            -2160 ml     Physical Exam  Gen: not in distress HEENT: no pallor, Moist mucosa supple neck Chest: Fine bibasilar crackles CVS: N S1&S2, no murmurs, rubs or gallop GI: soft, NT, ND, BS+ Musculoskeletal: warm, no edema CNS: Alert and  oriented    Data Review:    CBC  Recent Labs Lab 03/07/16 0927 03/10/16 1005 03/11/16 0236 03/12/16 0537  WBC 6.4 2.9* 1.3* 0.8*  HGB 9.5* 11.4* 10.8* 9.0*  HCT 28.7* 33.3* 31.4* 26.1*  PLT 155 81* 57* 31*  MCV 104.8* 102.1* 101.6* 101.2*  MCH 34.8* 35.0* 35.0* 34.9*  MCHC 33.2 34.2 34.4 34.5  RDW 15.4* 15.5 15.7* 15.6*  LYMPHSABS 0.5* 0.3* 0.1* 0.1*  MONOABS 0.8 0.2 0.1 0.0*  EOSABS 0.0 0.3 0.1 0.1  BASOSABS 0.0 0.0 0.0 0.0    Chemistries   Recent Labs Lab 03/07/16 0927 03/10/16 1005 03/11/16 0236 03/12/16 0537  NA 139 134* 131* 135  K 3.7 3.4* 3.4* 3.2*  CL  --  103 101 108  CO2 _0 GLUCOSE 72 97 103* 111*  BUN 26.8* _1 CREATININE  0.9 0.81 0.80 0.66  CALCIUM 9.1 8.3* 7.7* 7.3*  AST _2 --   ALT 27 43 39  --   ALKPHOS 81 67 58  --   BILITOT 0.53 1.1 1.4*  --    ------------------------------------------------------------------------------------------------------------------ No results for input(s): CHOL, HDL, LDLCALC, TRIG, CHOLHDL, LDLDIRECT in the last 72 hours.  No results found for: HGBA1C ------------------------------------------------------------------------------------------------------------------ No results for input(s): TSH, T4TOTAL, T3FREE, THYROIDAB in the last 72 hours.  Invalid input(s): FREET3 ------------------------------------------------------------------------------------------------------------------ No results for input(s): VITAMINB12, FOLATE, FERRITIN, TIBC, IRON, RETICCTPCT in the last 72 hours.  Coagulation profile  Recent Labs Lab 03/11/16 0855  INR 1.33    No results for input(s): DDIMER in the last 72 hours.  Cardiac Enzymes  Recent Labs Lab 03/10/16 1451 03/10/16 2006 03/11/16 0236  TROPONINI <0.03 <0.03 <0.03   ------------------------------------------------------------------------------------------------------------------ No results found for: BNP  Inpatient Medications  Scheduled Meds: . acyclovir  400 mg Oral BID  . atorvastatin  40 mg Oral Daily  . benzonatate  100 mg Oral Once  . calcium carbonate  1,250 mg Oral Daily  . ceFEPime (MAXIPIME) IV  2 g Intravenous Q8H  . fluticasone  2 spray Each Nare Daily  . furosemide  20 mg Intravenous Once  . furosemide  60 mg Intravenous Once  . guaiFENesin  600 mg Oral BID  . ipratropium-albuterol  3 mL Nebulization TID  . metoprolol succinate  25 mg Oral Daily  . vancomycin  1,000 mg Intravenous STAT   Continuous Infusions:  PRN Meds:.acetaminophen, albuterol, guaiFENesin-dextromethorphan, morphine, ondansetron, prochlorperazine, zolpidem  Micro Results Recent Results (from the past 240 hour(s))  Urine  culture     Status: None   Collection Time: 03/10/16  9:54 AM  Result Value Ref Range Status   Specimen Description URINE, RANDOM  Final   Special Requests NONE  Final   Culture NO GROWTH Performed at [facility]   Final   Report Status 03/11/2016 FINAL  Final  Culture, blood (routine x 2)     Status: None (Preliminary result)   Collection Time: 03/10/16  9:12 PM  Result Value Ref Range Status   Specimen Description BLOOD LEFT ARM  Final   Special Requests BOTTLES DRAWN AEROBIC AND ANAEROBIC 5CC  Final   Culture   Final    NO GROWTH 1 DAY Performed at [facility]    Report Status PENDING  Incomplete  Culture, blood (routine x 2)     Status: None (Preliminary result)   Collection Time: 03/10/16  9:18 PM  Result Value Ref Range Status   Specimen Description BLOOD  LEFT ARM  Final   Special Requests BOTTLES DRAWN AEROBIC AND ANAEROBIC 5CC  Final   Culture   Final    NO GROWTH 1 DAY Performed at [facility]    Report Status PENDING  Incomplete  Culture, sputum-assessment     Status: None   Collection Time: 03/11/16  4:24 PM  Result Value Ref Range Status   Specimen Description SPUTUM  Final   Special Requests NONE  Final   Sputum evaluation   Final    THIS SPECIMEN IS ACCEPTABLE. RESPIRATORY CULTURE REPORT TO FOLLOW.   Report Status 03/11/2016 FINAL  Final  Culture, respiratory (NON-Expectorated)     Status: None (Preliminary result)   Collection Time: 03/11/16  4:24 PM  Result Value Ref Range Status   Specimen Description SPUTUM  Final   Special Requests NONE  Final   Gram Stain   Final    ABUNDANT WBC PRESENT,BOTH PMN AND MONONUCLEAR FEW HYPHAE RARE GRAM NEGATIVE RODS RARE GRAM POSITIVE RODS FEW GRAM POSITIVE COCCI    Culture   Final    CULTURE REINCUBATED FOR BETTER GROWTH Performed at [facility]    Report Status PENDING  Incomplete    Radiology Reports Dg Chest 2 View  Result Date: 03/10/2016 CLINICAL DATA:  Cough and  shortness of breath.  Chest pain EXAM: CHEST  2 VIEW COMPARISON:  11/02/2014 FINDINGS: Normal heart size. Advanced chronic interstitial lung changes are again identified. Superimposed bibasilar opacities are identified. There are small pleural effusions present. Chronic multi level compression deformities are identified within the thoracic spine. IMPRESSION: 1. Bibasilar opacities which may represent atelectasis and/or pneumonia. 2. Small bilateral pleural effusions. Electronically Signed   By: Kerby Moors M.D.   On: 03/10/2016 10:25   Dg Chest Port 1 View  Result Date: 03/11/2016 CLINICAL DATA:  Productive cough and chest congestion for the past 2 weeks. Fever and hypoxia. Shortness of breath. Multiple myeloma. EXAM: PORTABLE CHEST 1 VIEW COMPARISON:  Yesterday. FINDINGS: Increased patchy density at both lung bases. Small bilateral pleural effusions. Diffuse osteopenia. Old, healed left clavicle fracture and old, healed left rib fractures. Previously described chronic thoracic vertebral compression deformities. IMPRESSION: Increased probable pneumonia at both lung bases with increased bilateral pleural fluid. Electronically Signed   By: Claudie Revering M.D.   On: 03/11/2016 18:18    Time Spent in minutes  35   Louellen Molder M.D on 03/12/2016 at 12:42 PM  Between 7am to 7pm - Pager - [phone]  After 7pm go to [email or web address] - password [facility]  [facility] -  Office  [phone]

## 2016-03-12 NOTE — Progress Notes (Signed)
Shannon Rush   DOB:Nov 12, 1971   CV#:[id number]   [phone]  Hematology and Oncology follow up note  Subjective: Pt has required less oxygen today, afebrile, BP still boardline low, no dizziness, able to ambulate and uses bathroom. No other complains.  Pancytopenia continues getting worse    Objective:  Vitals:   03/12/16 0408 03/12/16 1412  BP: (!) 116/51 (!) 91/58  Pulse: 98 100  Resp:  20  Temp: 99.6 F (37.6 C) 99.4 F (37.4 C)    Body mass index is 20.1 kg/m.  Intake/Output Summary (Last 24 hours) at 03/12/16 1855 Last data filed at 03/12/16 1424  Gross per 24 hour  Intake              390 ml  Output             5000 ml  Net            -4610 ml     Sclerae unicteric  Oropharynx clear  No peripheral adenopathy  Lungs clear -- no rales or rhonchi  Heart regular rate and rhythm  Abdomen benign  MSK no focal spinal tenderness, no peripheral edema  Neuro nonfocal    CBG (last 3)  No results for input(s): GLUCAP in the last 72 hours.   Labs:  Lab Results  Component Value Date   WBC 0.8 (LL) 03/12/2016   HGB 9.0 (L) 03/12/2016   HCT 26.1 (L) 03/12/2016   MCV 101.2 (H) 03/12/2016   PLT 31 (L) 03/12/2016   NEUTROABS 0.6 (L) 03/12/2016    CMP Latest Ref Rng & Units 03/12/2016 03/11/2016 03/10/2016  Glucose 65 - 99 mg/dL 111(H) 103(H) 97  BUN 6 - 20 mg/dL _0 Creatinine 0.44 - 1.00 mg/dL 0.66 0.80 0.81  Sodium 135 - 145 mmol/L 135 131(L) 134(L)  Potassium 3.5 - 5.1 mmol/L 3.2(L) 3.4(L) 3.4(L)  Chloride 101 - 111 mmol/L 108 101 103  CO2 22 - 32 mmol/L _1 Calcium 8.9 - 10.3 mg/dL 7.3(L) 7.7(L) 8.3(L)  Total Protein 6.5 - 8.1 g/dL - 5.8(L) 6.9  Total Bilirubin 0.3 - 1.2 mg/dL - 1.4(H) 1.1  Alkaline Phos 38 - 126 U/L - 58 67  AST 15 - 41 U/L - 28 31  ALT 14 - 54 U/L - 39 43     Urine Studies No results for input(s): UHGB, CRYS in the last 72 hours.  Invalid input(s): UACOL, UAPR, USPG, UPH, UTP, UGL, UKET, UBIL, UNIT, UROB, ULEU, UEPI,  UWBC, URBC, UBAC, CAST, [place], [place]  Basic Metabolic Panel:  Recent Labs Lab 03/07/16 0927  03/10/16 1005 03/11/16 0236 03/12/16 0537  NA 139  --  134* 131* 135  K 3.7  < > 3.4* 3.4* 3.2*  CL  --   --  103 101 108  CO2 24  --  _2 GLUCOSE 72  --  97 103* 111*  BUN 26.8*  --  _3 CREATININE 0.9  --  0.81 0.80 0.66  CALCIUM 9.1  --  8.3* 7.7* 7.3*  < > = values in this interval not displayed. GFR Estimated Creatinine Clearance: 55.9 mL/min (by C-G formula based on SCr of 0.8 mg/dL). Liver Function Tests:  Recent Labs Lab 03/07/16 0927 03/07/16 0928 03/10/16 1005 03/11/16 0236  AST 31  --  31 28  ALT 27  --  43 39  ALKPHOS 81  --  67 58  BILITOT 0.53  --  1.1 1.4*  PROT 7.5 6.8 6.9 5.8*  ALBUMIN 3.1*  --  3.3* 2.6*   No results for input(s): LIPASE, AMYLASE in the last 168 hours. No results for input(s): AMMONIA in the last 168 hours. Coagulation profile  Recent Labs Lab 03/11/16 0855  INR 1.33    CBC:  Recent Labs Lab 03/07/16 0927 03/10/16 1005 03/11/16 0236 03/12/16 0537  WBC 6.4 2.9* 1.3* 0.8*  NEUTROABS 5.0 2.1 1.0* 0.6*  HGB 9.5* 11.4* 10.8* 9.0*  HCT 28.7* 33.3* 31.4* 26.1*  MCV 104.8* 102.1* 101.6* 101.2*  PLT 155 81* 57* 31*   Cardiac Enzymes:  Recent Labs Lab 03/10/16 1451 03/10/16 2006 03/11/16 0236  TROPONINI <0.03 <0.03 <0.03   BNP: Invalid input(s): POCBNP CBG: No results for input(s): GLUCAP in the last 168 hours. D-Dimer No results for input(s): DDIMER in the last 72 hours. Hgb A1c No results for input(s): HGBA1C in the last 72 hours. Lipid Profile No results for input(s): CHOL, HDL, LDLCALC, TRIG, CHOLHDL, LDLDIRECT in the last 72 hours. Thyroid function studies No results for input(s): TSH, T4TOTAL, T3FREE, THYROIDAB in the last 72 hours.  Invalid input(s): FREET3 Anemia work up No results for input(s): VITAMINB12, FOLATE, FERRITIN, TIBC, IRON, RETICCTPCT in the last 72 hours. Microbiology Recent Results  (from the past 240 hour(s))  Urine culture     Status: None   Collection Time: 03/10/16  9:54 AM  Result Value Ref Range Status   Specimen Description URINE, RANDOM  Final   Special Requests NONE  Final   Culture NO GROWTH Performed at [facility]   Final   Report Status 03/11/2016 FINAL  Final  Culture, blood (routine x 2)     Status: None (Preliminary result)   Collection Time: 03/10/16  9:12 PM  Result Value Ref Range Status   Specimen Description BLOOD LEFT ARM  Final   Special Requests BOTTLES DRAWN AEROBIC AND ANAEROBIC 5CC  Final   Culture   Final    NO GROWTH 1 DAY Performed at [facility]    Report Status PENDING  Incomplete  Culture, blood (routine x 2)     Status: None (Preliminary result)   Collection Time: 03/10/16  9:18 PM  Result Value Ref Range Status   Specimen Description BLOOD LEFT ARM  Final   Special Requests BOTTLES DRAWN AEROBIC AND ANAEROBIC 5CC  Final   Culture   Final    NO GROWTH 1 DAY Performed at [facility]    Report Status PENDING  Incomplete  Culture, sputum-assessment     Status: None   Collection Time: 03/11/16  4:24 PM  Result Value Ref Range Status   Specimen Description SPUTUM  Final   Special Requests NONE  Final   Sputum evaluation   Final    THIS SPECIMEN IS ACCEPTABLE. RESPIRATORY CULTURE REPORT TO FOLLOW.   Report Status 03/11/2016 FINAL  Final  Culture, respiratory (NON-Expectorated)     Status: None (Preliminary result)   Collection Time: 03/11/16  4:24 PM  Result Value Ref Range Status   Specimen Description SPUTUM  Final   Special Requests NONE  Final   Gram Stain   Final    ABUNDANT WBC PRESENT,BOTH PMN AND MONONUCLEAR FEW HYPHAE RARE GRAM NEGATIVE RODS RARE GRAM POSITIVE RODS FEW GRAM POSITIVE COCCI    Culture   Final    CULTURE REINCUBATED FOR BETTER GROWTH Performed at [facility]    Report Status PENDING  Incomplete      Studies:  Dg Chest Port 1 View  Result Date:  03/11/2016 CLINICAL DATA:  Productive cough and chest congestion for the past 2 weeks. Fever and hypoxia. Shortness of breath. Multiple myeloma. EXAM: PORTABLE CHEST 1 VIEW COMPARISON:  Yesterday. FINDINGS: Increased patchy density at both lung bases. Small bilateral pleural effusions. Diffuse osteopenia. Old, healed left clavicle fracture and old, healed left rib fractures. Previously described chronic thoracic vertebral compression deformities. IMPRESSION: Increased probable pneumonia at both lung bases with increased bilateral pleural fluid. Electronically Signed   By: Claudie Revering M.D.   On: 03/11/2016 18:18    Assessment: 75 y.o. female with history of multiple myeloma status post autotransplant in August 2014, recently had disease relapse, and switched to new chemotherapy treatment.  1. HCAP with hypoxic respiratory failure, improving  2. Sepsis with hypotension, improved with IVF 3. Multiple myeloma, relapsed in July 2017, on new chemo  4. Pancytopenia due to chemotherapy, worsening  5. Coronary artery disease, history of STEMI in August 2015  Plan:  -Continue braod antibiotics  -continue Granix 300 g daily until neutropenia resolves, I ordered  -consider blood transfusion if Hb<7.5 or Plt <20K. I told her she may need plt transfusion tomorrow, watch for bleeding  -I will follow up closely   I appreciate excellent care from the hospitalist team, I spoke with Dr. Clementeen Graham today.   [name], MD 03/12/2016  6:54 PM

## 2016-03-12 NOTE — Telephone Encounter (Signed)
-----   Message from Prentiss Bells, RN sent at 03/11/2016  9:53 AM EDT ----- Regarding: FW: 1st daratumumab - sorry ladies - forgot to put [facility] pt   ----- Message ----- From: Prentiss Bells, RN Sent: 03/07/2016   4:55 PM To: Onc Triage Nurse [facility] Subject: 1st daratumumab

## 2016-03-12 NOTE — Progress Notes (Signed)
While ambulating patient's O2 Sat maintained in the low 90's without oxygen. Pt spot checked all day. Maintained low to mid 90's all day without oxygen.

## 2016-03-12 NOTE — Progress Notes (Signed)
Pharmacy Antibiotic Note  Shannon Rush is a 75 y.o. female with PMH of multiple myeloma admitted on 03/10/2016 with HCAP. Pharmacy has been consulted for Vancomycin and Cefepime dosing. Noted patient's allergy to Zosyn; however, she tolerated multiple doses of Cefepime in January 2016.  Tmax: 100.64F WBC low, 0.8; ANC 0.6 SCr 0.66 with CrCl ~ 56 ml/min  Plan: Increase Vancomycin to 1g IV x 1 STAT, then 1g IV q12h to start at 2200 tonight.  Plan for Vancomycin level at new steady state, as indicated.  Increase Cefepime to 2g IV q8h due to fever, neutropenia.  Monitor renal function, cultures, clinical course.  Height: 5' 6"  (167.6 cm) Weight: 124 lb 9 oz (56.5 kg) IBW/kg (Calculated) : 59.3  Temp (24hrs), Avg:99.5 F (37.5 C), Min:98.4 F (36.9 C), Max:100.6 F (38.1 C)   Recent Labs Lab 03/07/16 0927 03/07/16 0927 03/10/16 1005 03/10/16 1014 03/11/16 0236 03/11/16 0848 03/11/16 1225 03/12/16 0537 03/12/16 1142  WBC 6.4  --  2.9*  --  1.3*  --   --  0.8*  --   CREATININE  --  0.9 0.81  --  0.80  --   --  0.66  --   LATICACIDVEN  --   --   --  1.48  --  1.4 1.3  --   --   VANCOTROUGH  --   --   --   --   --   --   --   --  10*    Estimated Creatinine Clearance: 55.9 mL/min (by C-G formula based on SCr of 0.8 mg/dL).    Allergies  Allergen Reactions  . Zosyn [Piperacillin Sod-Tazobactam So] Rash    Itching, rash and swelling of extremities  . Piperacillin-Tazobactam In Dex Rash  . Zithromax [Azithromycin]     Full body rash  . Ciprofloxacin Rash    Throwing up and stomach pain     Antimicrobials this admission: 8/27 >> Vancomycin >> 8/27 >> Cefepime >>  Dose adjustments this admission: 8/29 1142 VT: 10 mcg/mL on 771m q12h, increase to 1g q12h  Microbiology results: 8/27 BCx: NGTD 8/27 UCx: NGF 8/28 Sputum: rare GNR, GPR, GPC, cx reincubated for better growth  Thank you for allowing pharmacy to be a part of this patient's care.   JLindell Spar PharmD,  BCPS Pager: [phone]/29/2017 12:50 PM

## 2016-03-12 NOTE — Telephone Encounter (Signed)
Pt was admitted to hospital on 8/27 with pneumonia.

## 2016-03-13 LAB — CBC WITH DIFFERENTIAL/PLATELET
BASOS ABS: 0 10*3/uL (ref 0.0–0.1)
Basophils Relative: 0 %
EOS ABS: 0.1 10*3/uL (ref 0.0–0.7)
Eosinophils Relative: 17 %
HCT: 23.7 % — ABNORMAL LOW (ref 36.0–46.0)
HEMOGLOBIN: 8.4 g/dL — AB (ref 12.0–15.0)
LYMPHS PCT: 17 %
Lymphs Abs: 0.1 10*3/uL — ABNORMAL LOW (ref 0.7–4.0)
MCH: 34.6 pg — ABNORMAL HIGH (ref 26.0–34.0)
MCHC: 35.4 g/dL (ref 30.0–36.0)
MCV: 97.5 fL (ref 78.0–100.0)
MONOS PCT: 7 %
Monocytes Absolute: 0 10*3/uL — ABNORMAL LOW (ref 0.1–1.0)
NEUTROS ABS: 0.4 10*3/uL — AB (ref 1.7–7.7)
NEUTROS PCT: 59 %
PLATELETS: 26 10*3/uL — AB (ref 150–400)
RBC: 2.43 MIL/uL — AB (ref 3.87–5.11)
RDW: 15.2 % (ref 11.5–15.5)
WBC: 0.6 10*3/uL — CL (ref 4.0–10.5)

## 2016-03-13 LAB — CULTURE, RESPIRATORY: CULTURE: NORMAL

## 2016-03-13 LAB — BASIC METABOLIC PANEL
ANION GAP: 4 — AB (ref 5–15)
BUN: 17 mg/dL (ref 6–20)
CHLORIDE: 104 mmol/L (ref 101–111)
CO2: 23 mmol/L (ref 22–32)
Calcium: 7.5 mg/dL — ABNORMAL LOW (ref 8.9–10.3)
Creatinine, Ser: 0.86 mg/dL (ref 0.44–1.00)
Glucose, Bld: 112 mg/dL — ABNORMAL HIGH (ref 65–99)
POTASSIUM: 3.4 mmol/L — AB (ref 3.5–5.1)
SODIUM: 131 mmol/L — AB (ref 135–145)

## 2016-03-13 LAB — CULTURE, RESPIRATORY W GRAM STAIN

## 2016-03-13 NOTE — Progress Notes (Signed)
CRITICAL VALUE ALERT  Critical value received:  WBC 0.6       Platelet 26  Date of notification:  03/13/2016  Time of notification:  0620  Critical value read back:Yes.    Nurse who received alert:  Daleen Bo RN  MD notified (1st page):  Lamar Blinks NP  Time of first page:  [phone]  MD notified (2nd page):  Time of second page:  Responding MD:  Lamar Blinks NP  Time MD responded:  [id number]

## 2016-03-13 NOTE — Progress Notes (Signed)
Shannon Rush   DOB:May 19, 1941   OV#:[id number]   [phone]  Hematology and Oncology follow up note  Subjective: Pt is doing better, off oxygen, dyspnea improved, feels tired, afebrile, VS stable, blood counts continue dropping, no bleeding    Objective:  Vitals:   03/13/16 1425 03/13/16 2031  BP: (!) 106/56 (!) 102/54  Pulse: 94 100  Resp: 18 16  Temp: 97.2 F (36.2 C) 98.9 F (37.2 C)    Body mass index is 20.1 kg/m.  Intake/Output Summary (Last 24 hours) at 03/13/16 2230 Last data filed at 03/13/16 2211  Gross per 24 hour  Intake              290 ml  Output             1500 ml  Net            -1210 ml     Sclerae unicteric  Oropharynx clear  No peripheral adenopathy  Lungs clear -- no rales or rhonchi  Heart regular rate and rhythm  Abdomen benign  MSK no focal spinal tenderness, no peripheral edema  Neuro nonfocal    CBG (last 3)  No results for input(s): GLUCAP in the last 72 hours.   Labs:  Lab Results  Component Value Date   WBC 0.6 (LL) 03/13/2016   HGB 8.4 (L) 03/13/2016   HCT 23.7 (L) 03/13/2016   MCV 97.5 03/13/2016   PLT 26 (LL) 03/13/2016   NEUTROABS 0.4 (L) 03/13/2016    CMP Latest Ref Rng & Units 03/13/2016 03/12/2016 03/11/2016  Glucose 65 - 99 mg/dL 112(H) 111(H) 103(H)  BUN 6 - 20 mg/dL 17 13 15   Creatinine 0.44 - 1.00 mg/dL 0.86 0.66 0.80  Sodium 135 - 145 mmol/L 131(L) 135 131(L)  Potassium 3.5 - 5.1 mmol/L 3.4(L) 3.2(L) 3.4(L)  Chloride 101 - 111 mmol/L 104 108 101  CO2 22 - 32 mmol/L 23 24 27   Calcium 8.9 - 10.3 mg/dL 7.5(L) 7.3(L) 7.7(L)  Total Protein 6.5 - 8.1 g/dL - - 5.8(L)  Total Bilirubin 0.3 - 1.2 mg/dL - - 1.4(H)  Alkaline Phos 38 - 126 U/L - - 58  AST 15 - 41 U/L - - 28  ALT 14 - 54 U/L - - 39     Urine Studies No results for input(s): UHGB, CRYS in the last 72 hours.  Invalid input(s): UACOL, UAPR, USPG, UPH, UTP, UGL, UKET, UBIL, UNIT, UROB, ULEU, UEPI, UWBC, [place], [place]  Basic Metabolic  Panel:  Recent Labs Lab 03/07/16 0927  03/10/16 1005 03/11/16 0236 03/12/16 0537 03/13/16 0548  NA 139  --  134* 131* 135 131*  K 3.7  < > 3.4* 3.4* 3.2* 3.4*  CL  --   --  103 101 108 104  CO2 24  --  26 27 24 23   GLUCOSE 72  --  97 103* 111* 112*  BUN 26.8*  --  15 15 13 17   CREATININE 0.9  --  0.81 0.80 0.66 0.86  CALCIUM 9.1  --  8.3* 7.7* 7.3* 7.5*  < > = values in this interval not displayed. GFR Estimated Creatinine Clearance: 52 mL/min (by C-G formula based on SCr of 0.86 mg/dL). Liver Function Tests:  Recent Labs Lab 03/07/16 0927 03/07/16 0928 03/10/16 1005 03/11/16 0236  AST 31  --  31 28  ALT 27  --  43 39  ALKPHOS 81  --  67 58  BILITOT 0.53  --  1.1  1.4*  PROT 7.5 6.8 6.9 5.8*  ALBUMIN 3.1*  --  3.3* 2.6*   No results for input(s): LIPASE, AMYLASE in the last 168 hours. No results for input(s): AMMONIA in the last 168 hours. Coagulation profile  Recent Labs Lab 03/11/16 0855  INR 1.33    CBC:  Recent Labs Lab 03/07/16 0927 03/10/16 1005 03/11/16 0236 03/12/16 0537 03/13/16 0548  WBC 6.4 2.9* 1.3* 0.8* 0.6*  NEUTROABS 5.0 2.1 1.0* 0.6* 0.4*  HGB 9.5* 11.4* 10.8* 9.0* 8.4*  HCT 28.7* 33.3* 31.4* 26.1* 23.7*  MCV 104.8* 102.1* 101.6* 101.2* 97.5  PLT 155 81* 57* 31* 26*   Cardiac Enzymes:  Recent Labs Lab 03/10/16 1451 03/10/16 2006 03/11/16 0236  TROPONINI <0.03 <0.03 <0.03   BNP: Invalid input(s): POCBNP CBG: No results for input(s): GLUCAP in the last 168 hours. D-Dimer No results for input(s): DDIMER in the last 72 hours. Hgb A1c No results for input(s): HGBA1C in the last 72 hours. Lipid Profile No results for input(s): CHOL, HDL, LDLCALC, TRIG, CHOLHDL, LDLDIRECT in the last 72 hours. Thyroid function studies No results for input(s): TSH, T4TOTAL, T3FREE, THYROIDAB in the last 72 hours.  Invalid input(s): FREET3 Anemia work up No results for input(s): VITAMINB12, FOLATE, FERRITIN, TIBC, IRON, RETICCTPCT in the last  72 hours. Microbiology Recent Results (from the past 240 hour(s))  Urine culture     Status: None   Collection Time: 03/10/16  9:54 AM  Result Value Ref Range Status   Specimen Description URINE, RANDOM  Final   Special Requests NONE  Final   Culture NO GROWTH Performed at [facility]   Final   Report Status 03/11/2016 FINAL  Final  Culture, blood (routine x 2)     Status: None (Preliminary result)   Collection Time: 03/10/16  9:12 PM  Result Value Ref Range Status   Specimen Description BLOOD LEFT ARM  Final   Special Requests BOTTLES DRAWN AEROBIC AND ANAEROBIC 5CC  Final   Culture   Final    NO GROWTH 2 DAYS Performed at [facility]    Report Status PENDING  Incomplete  Culture, blood (routine x 2)     Status: None (Preliminary result)   Collection Time: 03/10/16  9:18 PM  Result Value Ref Range Status   Specimen Description BLOOD LEFT ARM  Final   Special Requests BOTTLES DRAWN AEROBIC AND ANAEROBIC 5CC  Final   Culture   Final    NO GROWTH 2 DAYS Performed at [facility]    Report Status PENDING  Incomplete  Culture, sputum-assessment     Status: None   Collection Time: 03/11/16  4:24 PM  Result Value Ref Range Status   Specimen Description SPUTUM  Final   Special Requests NONE  Final   Sputum evaluation   Final    THIS SPECIMEN IS ACCEPTABLE. RESPIRATORY CULTURE REPORT TO FOLLOW.   Report Status 03/11/2016 FINAL  Final  Culture, respiratory (NON-Expectorated)     Status: None   Collection Time: 03/11/16  4:24 PM  Result Value Ref Range Status   Specimen Description SPUTUM  Final   Special Requests NONE  Final   Gram Stain   Final    ABUNDANT WBC PRESENT,BOTH PMN AND MONONUCLEAR FEW HYPHAE RARE GRAM NEGATIVE RODS RARE GRAM POSITIVE RODS FEW GRAM POSITIVE COCCI    Culture   Final    Consistent with normal respiratory flora. Performed at [facility]    Report Status 03/13/2016 FINAL  Final      Studies:  No results  found.  Assessment: 75 y.o. female with history of multiple myeloma status post autotransplant in August 2014, recently had disease relapse, and switched to new chemotherapy treatment.  1. HCAP with hypoxic respiratory failure, improving  2. Sepsis with hypotension, improved with IVF and antibiotics  3. Multiple myeloma, relapsed in July 2017, on new chemo  4. Pancytopenia due to chemotherapy, worsening  5. Coronary artery disease, history of STEMI in August 2015  Plan:  -hypoxia resolved, off oxygen, cultures negative so far. Continue braod antibiotics  -continue Granix 300 g daily until neutropenia resolves, I ordered  -consider blood transfusion if Hb<7.5 or Plt <20K, No need transfusion today, watch for bleeding  -I will follow up closely   I appreciate excellent care from the hospitalist team, I spoke with Dr. Waldron Labs today.   [name], MD 03/13/2016  10:30 PM

## 2016-03-13 NOTE — Progress Notes (Signed)
PROGRESS NOTE                                                                                                                                                                                                             Patient Demographics:    Shannon Rush, is a 75 y.o. female, DOB - 1940-10-20, GGY:[id number]  Admit date - 03/10/2016   Admitting Physician Costin Karlyne Greenspan, MD  Outpatient Primary MD for the patient is [name], MD  LOS - 3  Outpatient Specialists: Dr Burr Medico  Chief Complaint  Patient presents with  . Shortness of Breath    Immunocompromise       Brief Narrative   75 y/o  female with history of multiple myeloma on chemotherapy, coronary artery disease with history of STE MI in August 2016 status post thrombolytic, hyperlipidemia, osteoporosis presented to the ED with shortness of breath for the past 5-6 days associated with chest congestion, productive cough and chills. He saw her PCP 3 days back and was prescribed Levaquin without any improvement. Patient admitted with sepsis and pancytopenia secondary to lobar pneumonia.   Subjective:   Patient reports she is feeling much better, cough is subsiding, still productive with yellow phlegm, afebrile over last 24 hours   Assessment  & Plan :    Principal problem Sepsis ([facility]) Secondary to healthcare associated pneumonia with associated hypoxic respiratory failure. Received aggressive IV hydration. Blood pressure now improved. Continue empiric vancomycin and cefepime. Culture so far negative.    Active Problems: Acute hypoxic respiratory failure Secondary to HCAP and mild fluid overload. Will order another for 20 mg IV Lasix. Currently on room air.  Pancytopenia ([place]) Secondary to recent chemotherapy. Dr Burr Medico recommends daily IV granix until neutropenia resolved. ( 1st dose on 8/28) Monitor CBC daily. Maintain neutropenic  precautions.     Multiple myeloma ([facility]) Currently on pomalidomide and daratumumab. Follows with Dr Burr Medico    Coronary atherosclerosis of native coronary artery Continue Toprol. hold Plavix given thrombocytopenia    Protein-calorie malnutrition [facility])  consult dietitian.   Hypokalemia Replenished      Code Status : Full code  Family Communication  : Husband at bedside  Disposition Plan  : Home possibly 48-72  hours if sepsis resolved and cultures negative  Barriers For Discharge : Active symptoms  Consults  : Dr Burr Medico  Procedures  : None  DVT Prophylaxis  :  SCDs  Lab Results  Component Value Date   PLT 26 (LL) 03/13/2016    Antibiotics  :    Anti-infectives    Start     Dose/Rate Route Frequency Ordered Stop   03/12/16 2200  vancomycin (VANCOCIN) IVPB 1000 mg/200 mL premix     1,000 mg 200 mL/hr over 60 Minutes Intravenous Every 12 hours 03/12/16 1246     03/12/16 1245  vancomycin (VANCOCIN) IVPB 1000 mg/200 mL premix     1,000 mg 200 mL/hr over 60 Minutes Intravenous STAT 03/12/16 1233 03/12/16 1348   03/12/16 1200  ceFEPIme (MAXIPIME) 2 g in dextrose 5 % 50 mL IVPB     2 g 100 mL/hr over 30 Minutes Intravenous Every 8 hours 03/12/16 0843     03/11/16 0000  vancomycin (VANCOCIN) IVPB 750 mg/150 ml premix  Status:  Discontinued     750 mg 150 mL/hr over 60 Minutes Intravenous Every 12 hours 03/10/16 1205 03/12/16 1233   03/10/16 2200  acyclovir (ZOVIRAX) tablet 400 mg     400 mg Oral 2 times daily 03/10/16 1511     03/10/16 2000  ceFEPIme (MAXIPIME) 1 g in dextrose 5 % 50 mL IVPB  Status:  Discontinued     1 g 100 mL/hr over 30 Minutes Intravenous Every 8 hours 03/10/16 1307 03/12/16 0843   03/10/16 1400  aztreonam (AZACTAM) 2 g in dextrose 5 % 50 mL IVPB  Status:  Discontinued     2 g 100 mL/hr over 30 Minutes Intravenous Every 8 hours 03/10/16 1341 03/10/16 1344   03/10/16 1115  ceFEPIme (MAXIPIME) 2 g in dextrose 5 % 50 mL IVPB     2 g 100 mL/hr over 30  Minutes Intravenous STAT 03/10/16 1100 03/10/16 1306   03/10/16 1100  vancomycin (VANCOCIN) IVPB 750 mg/150 ml premix     750 mg 150 mL/hr over 60 Minutes Intravenous STAT 03/10/16 1057 03/10/16 1406   03/10/16 1045  aztreonam (AZACTAM) 2 g in dextrose 5 % 50 mL IVPB  Status:  Discontinued     2 g 100 mL/hr over 30 Minutes Intravenous  Once 03/10/16 1042 03/10/16 1100        Objective:   Vitals:   03/12/16 1412 03/12/16 2123 03/13/16 0459 03/13/16 0808  BP: (!) 91/58 (!) 110/58 (!) 105/53   Pulse: 100 72 69   Resp: 20 20 18    Temp: 99.4 F (37.4 C) 98.9 F (37.2 C) 99.8 F (37.7 C)   TempSrc: Oral Oral Oral   SpO2: 98% 93% 92% 92%  Weight:      Height:        Wt Readings from Last 3 Encounters:  03/10/16 56.5 kg (124 lb 9 oz)  03/07/16 56.5 kg (124 lb 9.6 oz)  02/15/16 55.6 kg (122 lb 9.6 oz)     Intake/Output Summary (Last 24 hours) at 03/13/16 1331 Last data filed at 03/13/16 1100  Gross per 24 hour  Intake              780 ml  Output             3500 ml  Net            -2720 ml     Physical Exam  Gen: not in distress HEENT: no pallor, Moist mucosa supple neck Chest: Fine bibasilar crackles CVS: N S1&S2, no murmurs, rubs or gallop GI: soft, NT, ND, BS+  Musculoskeletal: warm, no edema CNS: Alert and oriented    Data Review:    CBC  Recent Labs Lab 03/07/16 0927 03/10/16 1005 03/11/16 0236 03/12/16 0537 03/13/16 0548  WBC 6.4 2.9* 1.3* 0.8* 0.6*  HGB 9.5* 11.4* 10.8* 9.0* 8.4*  HCT 28.7* 33.3* 31.4* 26.1* 23.7*  PLT 155 81* 57* 31* 26*  MCV 104.8* 102.1* 101.6* 101.2* 97.5  MCH 34.8* 35.0* 35.0* 34.9* 34.6*  MCHC 33.2 34.2 34.4 34.5 35.4  RDW 15.4* 15.5 15.7* 15.6* 15.2  LYMPHSABS 0.5* 0.3* 0.1* 0.1* 0.1*  MONOABS 0.8 0.2 0.1 0.0* 0.0*  EOSABS 0.0 0.3 0.1 0.1 0.1  BASOSABS 0.0 0.0 0.0 0.0 0.0    Chemistries   Recent Labs Lab 03/07/16 0927 03/10/16 1005 03/11/16 0236 03/12/16 0537 03/13/16 0548  NA 139 134* 131* 135 131*  K 3.7  3.4* 3.4* 3.2* 3.4*  CL  --  103 101 108 104  CO2 24 26 27 24 23   GLUCOSE 72 97 103* 111* 112*  BUN 26.8* 15 15 13 17   CREATININE 0.9 0.81 0.80 0.66 0.86  CALCIUM 9.1 8.3* 7.7* 7.3* 7.5*  AST 31 31 28   --   --   ALT 27 43 39  --   --   ALKPHOS 81 67 58  --   --   BILITOT 0.53 1.1 1.4*  --   --    ------------------------------------------------------------------------------------------------------------------ No results for input(s): CHOL, HDL, LDLCALC, TRIG, CHOLHDL, LDLDIRECT in the last 72 hours.  No results found for: HGBA1C ------------------------------------------------------------------------------------------------------------------ No results for input(s): TSH, T4TOTAL, T3FREE, THYROIDAB in the last 72 hours.  Invalid input(s): FREET3 ------------------------------------------------------------------------------------------------------------------ No results for input(s): VITAMINB12, FOLATE, FERRITIN, TIBC, IRON, RETICCTPCT in the last 72 hours.  Coagulation profile  Recent Labs Lab 03/11/16 0855  INR 1.33    No results for input(s): DDIMER in the last 72 hours.  Cardiac Enzymes  Recent Labs Lab 03/10/16 1451 03/10/16 2006 03/11/16 0236  TROPONINI <0.03 <0.03 <0.03   ------------------------------------------------------------------------------------------------------------------ No results found for: BNP  Inpatient Medications  Scheduled Meds: . acyclovir  400 mg Oral BID  . atorvastatin  40 mg Oral Daily  . benzonatate  100 mg Oral Once  . calcium carbonate  1,250 mg Oral Daily  . ceFEPime (MAXIPIME) IV  2 g Intravenous Q8H  . fluticasone  2 spray Each Nare Daily  . furosemide  60 mg Intravenous Once  . guaiFENesin  600 mg Oral BID  . ipratropium-albuterol  3 mL Nebulization TID  . metoprolol succinate  25 mg Oral Daily  . Tbo-filgastrim (GRANIX) SQ  300 mcg Subcutaneous q1800  . vancomycin  1,000 mg Intravenous Q12H   Continuous Infusions:   PRN Meds:.acetaminophen, albuterol, guaiFENesin-dextromethorphan, morphine, ondansetron, prochlorperazine, zolpidem  Micro Results Recent Results (from the past 240 hour(s))  Urine culture     Status: None   Collection Time: 03/10/16  9:54 AM  Result Value Ref Range Status   Specimen Description URINE, RANDOM  Final   Special Requests NONE  Final   Culture NO GROWTH Performed at [facility]   Final   Report Status 03/11/2016 FINAL  Final  Culture, blood (routine x 2)     Status: None (Preliminary result)   Collection Time: 03/10/16  9:12 PM  Result Value Ref Range Status   Specimen Description BLOOD LEFT ARM  Final   Special Requests BOTTLES DRAWN AEROBIC AND ANAEROBIC 5CC  Final   Culture   Final    NO GROWTH 1 DAY Performed  at [facility]    Report Status PENDING  Incomplete  Culture, blood (routine x 2)     Status: None (Preliminary result)   Collection Time: 03/10/16  9:18 PM  Result Value Ref Range Status   Specimen Description BLOOD LEFT ARM  Final   Special Requests BOTTLES DRAWN AEROBIC AND ANAEROBIC 5CC  Final   Culture   Final    NO GROWTH 1 DAY Performed at [facility]    Report Status PENDING  Incomplete  Culture, sputum-assessment     Status: None   Collection Time: 03/11/16  4:24 PM  Result Value Ref Range Status   Specimen Description SPUTUM  Final   Special Requests NONE  Final   Sputum evaluation   Final    THIS SPECIMEN IS ACCEPTABLE. RESPIRATORY CULTURE REPORT TO FOLLOW.   Report Status 03/11/2016 FINAL  Final  Culture, respiratory (NON-Expectorated)     Status: None   Collection Time: 03/11/16  4:24 PM  Result Value Ref Range Status   Specimen Description SPUTUM  Final   Special Requests NONE  Final   Gram Stain   Final    ABUNDANT WBC PRESENT,BOTH PMN AND MONONUCLEAR FEW HYPHAE RARE GRAM NEGATIVE RODS RARE GRAM POSITIVE RODS FEW GRAM POSITIVE COCCI    Culture   Final    Consistent with normal respiratory  flora. Performed at [facility]    Report Status 03/13/2016 FINAL  Final    Radiology Reports Dg Chest 2 View  Result Date: 03/10/2016 CLINICAL DATA:  Cough and shortness of breath.  Chest pain EXAM: CHEST  2 VIEW COMPARISON:  11/02/2014 FINDINGS: Normal heart size. Advanced chronic interstitial lung changes are again identified. Superimposed bibasilar opacities are identified. There are small pleural effusions present. Chronic multi level compression deformities are identified within the thoracic spine. IMPRESSION: 1. Bibasilar opacities which may represent atelectasis and/or pneumonia. 2. Small bilateral pleural effusions. Electronically Signed   By: Kerby Moors M.D.   On: 03/10/2016 10:25   Dg Chest Port 1 View  Result Date: 03/11/2016 CLINICAL DATA:  Productive cough and chest congestion for the past 2 weeks. Fever and hypoxia. Shortness of breath. Multiple myeloma. EXAM: PORTABLE CHEST 1 VIEW COMPARISON:  Yesterday. FINDINGS: Increased patchy density at both lung bases. Small bilateral pleural effusions. Diffuse osteopenia. Old, healed left clavicle fracture and old, healed left rib fractures. Previously described chronic thoracic vertebral compression deformities. IMPRESSION: Increased probable pneumonia at both lung bases with increased bilateral pleural fluid. Electronically Signed   By: Claudie Revering M.D.   On: 03/11/2016 18:18     Anastashia Westerfeld M.D on 03/13/2016 at 1:31 PM  Between 7am to 7pm - Pager - [phone]  After 7pm go to [email or web address] - password [facility]  [facility] -  Office  [phone]

## 2016-03-14 LAB — CBC
HEMATOCRIT: 24.1 % — AB (ref 36.0–46.0)
Hemoglobin: 8.6 g/dL — ABNORMAL LOW (ref 12.0–15.0)
MCH: 34.3 pg — ABNORMAL HIGH (ref 26.0–34.0)
MCHC: 35.7 g/dL (ref 30.0–36.0)
MCV: 96 fL (ref 78.0–100.0)
PLATELETS: 23 10*3/uL — AB (ref 150–400)
RBC: 2.51 MIL/uL — ABNORMAL LOW (ref 3.87–5.11)
RDW: 15.2 % (ref 11.5–15.5)
WBC: 0.3 10*3/uL — AB (ref 4.0–10.5)

## 2016-03-14 LAB — BASIC METABOLIC PANEL
ANION GAP: 6 (ref 5–15)
BUN: 14 mg/dL (ref 6–20)
CALCIUM: 7.7 mg/dL — AB (ref 8.9–10.3)
CO2: 23 mmol/L (ref 22–32)
CREATININE: 0.7 mg/dL (ref 0.44–1.00)
Chloride: 104 mmol/L (ref 101–111)
Glucose, Bld: 107 mg/dL — ABNORMAL HIGH (ref 65–99)
Potassium: 3.4 mmol/L — ABNORMAL LOW (ref 3.5–5.1)
SODIUM: 133 mmol/L — AB (ref 135–145)

## 2016-03-14 MED ORDER — GUAIFENESIN ER 600 MG PO TB12: 600.0000 mg | ORAL_TABLET | Freq: Two times a day (BID) | ORAL | Status: DC

## 2016-03-14 MED ORDER — POTASSIUM CHLORIDE CRYS ER 10 MEQ PO TBCR
30.0000 meq | EXTENDED_RELEASE_TABLET | Freq: Four times a day (QID) | ORAL | Status: AC
  Administered 2016-03-14 (×2): 30 meq via ORAL
  Filled 2016-03-14 (×2): qty 1

## 2016-03-14 MED ORDER — IPRATROPIUM-ALBUTEROL 0.5-2.5 (3) MG/3ML IN SOLN
3.0000 mL | Freq: Two times a day (BID) | RESPIRATORY_TRACT | Status: DC
  Administered 2016-03-15 – 2016-03-25 (×21): 3 mL via RESPIRATORY_TRACT
  Filled 2016-03-14 (×21): qty 3

## 2016-03-14 MED ORDER — ENSURE ENLIVE PO LIQD
237.0000 mL | Freq: Three times a day (TID) | ORAL | Status: DC
  Administered 2016-03-14 – 2016-03-19 (×11): 237 mL via ORAL

## 2016-03-14 NOTE — Care Management Important Message (Signed)
Important Message  Patient Details  Name: MURSAL SELL MRN: [id number] Date of Birth: [birth year]   Medicare Important Message Given:  Yes    Camillo Flaming 03/14/2016, 10:18 AMImportant Message  Patient Details  Name: RAYSSA STRAMEL MRN: [id number] Date of Birth: 1940-12-22   Medicare Important Message Given:  Yes    Camillo Flaming 03/14/2016, 10:18 AM

## 2016-03-14 NOTE — Progress Notes (Signed)
PROGRESS NOTE                                                                                                                                                                                                             Patient Demographics:    Shannon Rush, is a 75 y.o. female, DOB - January 21, 1941, VVO:[id number]  Admit date - 03/10/2016   Admitting Physician Costin Karlyne Greenspan, MD  Outpatient Primary MD for the patient is [name], MD  LOS - 4  Outpatient Specialists: Dr Burr Medico  Chief Complaint  Patient presents with  . Shortness of Breath    Immunocompromise       Brief Narrative   75 y/o  female with history of multiple myeloma on chemotherapy, coronary artery disease with history of STE MI in August 2016 status post thrombolytic, hyperlipidemia, osteoporosis presented to the ED with shortness of breath for the past 5-6 days associated with chest congestion, productive cough and chills. He saw her PCP 3 days back and was prescribed Levaquin without any improvement. Patient admitted with sepsis and pancytopenia secondary to lobar pneumonia.   Subjective:   Patient reports she is feeling much better, cough is subsiding, still productive with yellow phlegm, afebrile over last 24 hours   Assessment  & Plan :    Sepsis ([place]) - Secondary to healthcare associated pneumonia with associated hypoxic respiratory failure. - Received aggressive IV hydration. Blood pressure now improved. -  On empiric vancomycin and cefepime, start vancomycin 8/31 - Culture so far negative.   Acute hypoxic respiratory failure - Secondary to HCAP and mild fluid overload. - Currently on room air.  Pancytopenia (HCC)/neutropenia -Secondary to recent chemotherapy. Dr Burr Medico recommends daily IV granix until neutropenia resolved. ( 1st dose on 8/28) Monitor CBC daily. Maintain neutropenic precautions. - With thrombocytopenia, platelet  count today is 23K, recommendation to transfuse if less than 20 K.   Multiple myeloma (HCC) - Follows with Dr Burr Medico  Coronary atherosclerosis of native coronary artery - Continue Toprol. hold Plavix given thrombocytopenia  Protein-calorie malnutrition ([place]) - Continue with ensure  Hypokalemia Replenished      Code Status : Full code  Family Communication  : Husband at bedside  Disposition Plan  : Home possibly 48-72  hours if sepsis/neutropenia resolved and cultures negative  Consults  : Dr Burr Medico  Procedures  :  None  DVT Prophylaxis  :  SCDs  Lab Results  Component Value Date   PLT 23 (LL) 03/14/2016    Antibiotics  :    Anti-infectives    Start     Dose/Rate Route Frequency Ordered Stop   03/12/16 2200  vancomycin (VANCOCIN) IVPB 1000 mg/200 mL premix  Status:  Discontinued     1,000 mg 200 mL/hr over 60 Minutes Intravenous Every 12 hours 03/12/16 1246 03/14/16 0937   03/12/16 1245  vancomycin (VANCOCIN) IVPB 1000 mg/200 mL premix     1,000 mg 200 mL/hr over 60 Minutes Intravenous STAT 03/12/16 1233 03/12/16 1348   03/12/16 1200  ceFEPIme (MAXIPIME) 2 g in dextrose 5 % 50 mL IVPB     2 g 100 mL/hr over 30 Minutes Intravenous Every 8 hours 03/12/16 0843     03/11/16 0000  vancomycin (VANCOCIN) IVPB 750 mg/150 ml premix  Status:  Discontinued     750 mg 150 mL/hr over 60 Minutes Intravenous Every 12 hours 03/10/16 1205 03/12/16 1233   03/10/16 2200  acyclovir (ZOVIRAX) tablet 400 mg     400 mg Oral 2 times daily 03/10/16 1511     03/10/16 2000  ceFEPIme (MAXIPIME) 1 g in dextrose 5 % 50 mL IVPB  Status:  Discontinued     1 g 100 mL/hr over 30 Minutes Intravenous Every 8 hours 03/10/16 1307 03/12/16 0843   03/10/16 1400  aztreonam (AZACTAM) 2 g in dextrose 5 % 50 mL IVPB  Status:  Discontinued     2 g 100 mL/hr over 30 Minutes Intravenous Every 8 hours 03/10/16 1341 03/10/16 1344   03/10/16 1115  ceFEPIme (MAXIPIME) 2 g in dextrose 5 % 50 mL IVPB     2 g 100  mL/hr over 30 Minutes Intravenous STAT 03/10/16 1100 03/10/16 1306   03/10/16 1100  vancomycin (VANCOCIN) IVPB 750 mg/150 ml premix     750 mg 150 mL/hr over 60 Minutes Intravenous STAT 03/10/16 1057 03/10/16 1406   03/10/16 1045  aztreonam (AZACTAM) 2 g in dextrose 5 % 50 mL IVPB  Status:  Discontinued     2 g 100 mL/hr over 30 Minutes Intravenous  Once 03/10/16 1042 03/10/16 1100        Objective:   Vitals:   03/13/16 1425 03/13/16 2031 03/14/16 0509 03/14/16 0918  BP: (!) 106/56 (!) 102/54 (!) 91/48   Pulse: 94 100 (!) 101 94  Resp: 18 16 20 18   Temp: 97.2 F (36.2 C) 98.9 F (37.2 C) 99.4 F (37.4 C)   TempSrc: Oral Oral Oral   SpO2: 93% 92% 97% 98%  Weight:      Height:        Wt Readings from Last 3 Encounters:  03/10/16 56.5 kg (124 lb 9 oz)  03/07/16 56.5 kg (124 lb 9.6 oz)  02/15/16 55.6 kg (122 lb 9.6 oz)     Intake/Output Summary (Last 24 hours) at 03/14/16 1210 Last data filed at 03/14/16 0758  Gross per 24 hour  Intake                0 ml  Output             1851 ml  Net            -1851 ml     Physical Exam  Gen: not in distress HEENT: no pallor, Moist mucosa supple neck Chest: Fine bibasilar crackles CVS: N S1&S2, no murmurs, rubs or gallop GI:  soft, NT, ND, BS+ Musculoskeletal: warm, no edema CNS: Alert and oriented    Data Review:    CBC  Recent Labs Lab 03/10/16 1005 03/11/16 0236 03/12/16 0537 03/13/16 0548 03/14/16 0526  WBC 2.9* 1.3* 0.8* 0.6* 0.3*  HGB 11.4* 10.8* 9.0* 8.4* 8.6*  HCT 33.3* 31.4* 26.1* 23.7* 24.1*  PLT 81* 57* 31* 26* 23*  MCV 102.1* 101.6* 101.2* 97.5 96.0  MCH 35.0* 35.0* 34.9* 34.6* 34.3*  MCHC 34.2 34.4 34.5 35.4 35.7  RDW 15.5 15.7* 15.6* 15.2 15.2  LYMPHSABS 0.3* 0.1* 0.1* 0.1*  --   MONOABS 0.2 0.1 0.0* 0.0*  --   EOSABS 0.3 0.1 0.1 0.1  --   BASOSABS 0.0 0.0 0.0 0.0  --     Chemistries   Recent Labs Lab 03/10/16 1005 03/11/16 0236 03/12/16 0537 03/13/16 0548 03/14/16 0526  NA 134*  131* 135 131* 133*  K 3.4* 3.4* 3.2* 3.4* 3.4*  CL 103 101 108 104 104  CO2 26 27 24 23 23   GLUCOSE 97 103* 111* 112* 107*  BUN 15 15 13 17 14   CREATININE 0.81 0.80 0.66 0.86 0.70  CALCIUM 8.3* 7.7* 7.3* 7.5* 7.7*  AST 31 28  --   --   --   ALT 43 39  --   --   --   ALKPHOS 67 58  --   --   --   BILITOT 1.1 1.4*  --   --   --    ------------------------------------------------------------------------------------------------------------------ No results for input(s): CHOL, HDL, LDLCALC, TRIG, CHOLHDL, LDLDIRECT in the last 72 hours.  No results found for: HGBA1C ------------------------------------------------------------------------------------------------------------------ No results for input(s): TSH, T4TOTAL, T3FREE, THYROIDAB in the last 72 hours.  Invalid input(s): FREET3 ------------------------------------------------------------------------------------------------------------------ No results for input(s): VITAMINB12, FOLATE, FERRITIN, TIBC, IRON, RETICCTPCT in the last 72 hours.  Coagulation profile  Recent Labs Lab 03/11/16 0855  INR 1.33    No results for input(s): DDIMER in the last 72 hours.  Cardiac Enzymes  Recent Labs Lab 03/10/16 1451 03/10/16 2006 03/11/16 0236  TROPONINI <0.03 <0.03 <0.03   ------------------------------------------------------------------------------------------------------------------ No results found for: BNP  Inpatient Medications  Scheduled Meds: . acyclovir  400 mg Oral BID  . atorvastatin  40 mg Oral Daily  . benzonatate  100 mg Oral Once  . calcium carbonate  1,250 mg Oral Daily  . ceFEPime (MAXIPIME) IV  2 g Intravenous Q8H  . feeding supplement (ENSURE ENLIVE)  237 mL Oral TID BM  . fluticasone  2 spray Each Nare Daily  . furosemide  60 mg Intravenous Once  . guaiFENesin  600 mg Oral BID  . ipratropium-albuterol  3 mL Nebulization TID  . metoprolol succinate  25 mg Oral Daily  . potassium chloride  30 mEq Oral  Q6H  . Tbo-filgastrim (GRANIX) SQ  300 mcg Subcutaneous q1800   Continuous Infusions:  PRN Meds:.acetaminophen, albuterol, guaiFENesin-dextromethorphan, morphine, ondansetron, prochlorperazine, zolpidem  Micro Results Recent Results (from the past 240 hour(s))  Urine culture     Status: None   Collection Time: 03/10/16  9:54 AM  Result Value Ref Range Status   Specimen Description URINE, RANDOM  Final   Special Requests NONE  Final   Culture NO GROWTH Performed at [facility]   Final   Report Status 03/11/2016 FINAL  Final  Culture, blood (routine x 2)     Status: None (Preliminary result)   Collection Time: 03/10/16  9:12 PM  Result Value Ref Range Status   Specimen Description  BLOOD LEFT ARM  Final   Special Requests BOTTLES DRAWN AEROBIC AND ANAEROBIC 5CC  Final   Culture   Final    NO GROWTH 2 DAYS Performed at [facility]    Report Status PENDING  Incomplete  Culture, blood (routine x 2)     Status: None (Preliminary result)   Collection Time: 03/10/16  9:18 PM  Result Value Ref Range Status   Specimen Description BLOOD LEFT ARM  Final   Special Requests BOTTLES DRAWN AEROBIC AND ANAEROBIC 5CC  Final   Culture   Final    NO GROWTH 2 DAYS Performed at [facility]    Report Status PENDING  Incomplete  Culture, sputum-assessment     Status: None   Collection Time: 03/11/16  4:24 PM  Result Value Ref Range Status   Specimen Description SPUTUM  Final   Special Requests NONE  Final   Sputum evaluation   Final    THIS SPECIMEN IS ACCEPTABLE. RESPIRATORY CULTURE REPORT TO FOLLOW.   Report Status 03/11/2016 FINAL  Final  Culture, respiratory (NON-Expectorated)     Status: None   Collection Time: 03/11/16  4:24 PM  Result Value Ref Range Status   Specimen Description SPUTUM  Final   Special Requests NONE  Final   Gram Stain   Final    ABUNDANT WBC PRESENT,BOTH PMN AND MONONUCLEAR FEW HYPHAE RARE GRAM NEGATIVE RODS RARE GRAM POSITIVE RODS FEW  GRAM POSITIVE COCCI    Culture   Final    Consistent with normal respiratory flora. Performed at [facility]    Report Status 03/13/2016 FINAL  Final    Radiology Reports Dg Chest 2 View  Result Date: 03/10/2016 CLINICAL DATA:  Cough and shortness of breath.  Chest pain EXAM: CHEST  2 VIEW COMPARISON:  11/02/2014 FINDINGS: Normal heart size. Advanced chronic interstitial lung changes are again identified. Superimposed bibasilar opacities are identified. There are small pleural effusions present. Chronic multi level compression deformities are identified within the thoracic spine. IMPRESSION: 1. Bibasilar opacities which may represent atelectasis and/or pneumonia. 2. Small bilateral pleural effusions. Electronically Signed   By: Kerby Moors M.D.   On: 03/10/2016 10:25   Dg Chest Port 1 View  Result Date: 03/11/2016 CLINICAL DATA:  Productive cough and chest congestion for the past 2 weeks. Fever and hypoxia. Shortness of breath. Multiple myeloma. EXAM: PORTABLE CHEST 1 VIEW COMPARISON:  Yesterday. FINDINGS: Increased patchy density at both lung bases. Small bilateral pleural effusions. Diffuse osteopenia. Old, healed left clavicle fracture and old, healed left rib fractures. Previously described chronic thoracic vertebral compression deformities. IMPRESSION: Increased probable pneumonia at both lung bases with increased bilateral pleural fluid. Electronically Signed   By: Claudie Revering M.D.   On: 03/11/2016 18:18     Amarion Portell M.D on 03/14/2016 at 12:10 PM  Between 7am to 7pm - Pager - [phone]  After 7pm go to [email or web address] - password [facility]  [facility] -  Office  [phone]

## 2016-03-15 DIAGNOSIS — J449 Chronic obstructive pulmonary disease, unspecified: Secondary | ICD-10-CM

## 2016-03-15 DIAGNOSIS — I959 Hypotension, unspecified: Secondary | ICD-10-CM

## 2016-03-15 DIAGNOSIS — R0902 Hypoxemia: Secondary | ICD-10-CM

## 2016-03-15 LAB — GLUCOSE, CAPILLARY: Glucose-Capillary: 107 mg/dL — ABNORMAL HIGH (ref 65–99)

## 2016-03-15 LAB — CBC WITH DIFFERENTIAL/PLATELET
BASOS PCT: 0 %
BLASTS: 0 %
Band Neutrophils: 0 %
Basophils Absolute: 0 10*3/uL (ref 0.0–0.1)
EOS ABS: 0.1 10*3/uL (ref 0.0–0.7)
Eosinophils Relative: 42 %
HCT: 24.2 % — ABNORMAL LOW (ref 36.0–46.0)
HEMOGLOBIN: 8.6 g/dL — AB (ref 12.0–15.0)
Lymphocytes Relative: 36 %
Lymphs Abs: 0.2 10*3/uL — ABNORMAL LOW (ref 0.7–4.0)
MCH: 35.1 pg — ABNORMAL HIGH (ref 26.0–34.0)
MCHC: 35.5 g/dL (ref 30.0–36.0)
MCV: 98.8 fL (ref 78.0–100.0)
MONO ABS: 0 10*3/uL — AB (ref 0.1–1.0)
MYELOCYTES: 0 %
Metamyelocytes Relative: 0 %
Monocytes Relative: 6 %
NEUTROS PCT: 16 %
NRBC: 0 /100{WBCs}
Neutro Abs: 0 10*3/uL — ABNORMAL LOW (ref 1.7–7.7)
Other: 0 %
PLATELETS: 18 10*3/uL — AB (ref 150–400)
PROMYELOCYTES ABS: 0 %
RBC: 2.45 MIL/uL — ABNORMAL LOW (ref 3.87–5.11)
RDW: 15.3 % (ref 11.5–15.5)
WBC: 0.3 10*3/uL — CL (ref 4.0–10.5)

## 2016-03-15 LAB — BASIC METABOLIC PANEL
ANION GAP: 3 — AB (ref 5–15)
BUN: 17 mg/dL (ref 6–20)
CO2: 24 mmol/L (ref 22–32)
Calcium: 7.9 mg/dL — ABNORMAL LOW (ref 8.9–10.3)
Chloride: 104 mmol/L (ref 101–111)
Creatinine, Ser: 0.75 mg/dL (ref 0.44–1.00)
GFR calc Af Amer: >60 mL/min (ref >60)
Glucose, Bld: 110 mg/dL — ABNORMAL HIGH (ref 65–99)
POTASSIUM: 3.7 mmol/L (ref 3.5–5.1)
SODIUM: 131 mmol/L — AB (ref 135–145)

## 2016-03-15 LAB — PHOSPHORUS: PHOSPHORUS: 1.8 mg/dL — AB (ref 2.5–4.6)

## 2016-03-15 MED ORDER — SODIUM CHLORIDE 0.9 % IV SOLN
Freq: Once | INTRAVENOUS | Status: AC
  Administered 2016-03-15: 13:00:00 via INTRAVENOUS

## 2016-03-15 MED ORDER — DEXTROSE 5 % IV SOLN
30.0000 mmol | Freq: Once | INTRAVENOUS | Status: AC
  Administered 2016-03-15: 30 mmol via INTRAVENOUS
  Filled 2016-03-15: qty 10

## 2016-03-15 MED ORDER — TBO-FILGRASTIM 300 MCG/0.5ML ~~LOC~~ SOSY: 300.0000 ug | PREFILLED_SYRINGE | Freq: Every day | SUBCUTANEOUS | Status: DC

## 2016-03-15 MED ORDER — TBO-FILGRASTIM 480 MCG/0.8ML ~~LOC~~ SOSY
480.0000 ug | PREFILLED_SYRINGE | Freq: Every day | SUBCUTANEOUS | Status: DC
  Administered 2016-03-15 – 2016-03-16 (×2): 480 ug via SUBCUTANEOUS
  Filled 2016-03-15 (×2): qty 0.8

## 2016-03-15 NOTE — Plan of Care (Signed)
Problem: Physical Regulation: Goal: Will remain free from infection Continue.    Problem: Skin Integrity: Goal: Risk for impaired skin integrity will decrease Outcome: Progressing .  Problem: Nutrition: Goal: Adequate nutrition will be maintained Continue.

## 2016-03-15 NOTE — Progress Notes (Signed)
Shannon Rush   DOB:Jul 19, 1940   KA#:[id number]   [phone]  Hematology and Oncology follow up note  Subjective: Pancytopenia continue worsening, ANC 0, plt 18K today, she had T100.4 last night, no bleeding. Feels tired, dyspnea and cough have improved. No other new complains.    Objective:  Vitals:   03/15/16 0705 03/15/16 0900  BP: (!) 94/49   Pulse: (!) 107   Resp: 18   Temp: (!) 100.4 F (38 C) 99.3 F (37.4 C)    Body mass index is 20.1 kg/m.  Intake/Output Summary (Last 24 hours) at 03/15/16 1145 Last data filed at 03/15/16 0432  Gross per 24 hour  Intake              390 ml  Output                0 ml  Net              390 ml     Sclerae unicteric  Oropharynx clear  No peripheral adenopathy  Lungs clear -- no rales or rhonchi  Heart regular rate and rhythm  Abdomen benign  MSK no focal spinal tenderness, no peripheral edema  Neuro nonfocal    CBG (last 3)   Recent Labs  03/15/16 0754  GLUCAP 107*     Labs:  Lab Results  Component Value Date   WBC 0.3 (LL) 03/15/2016   HGB 8.6 (L) 03/15/2016   HCT 24.2 (L) 03/15/2016   MCV 98.8 03/15/2016   PLT 18 (LL) 03/15/2016   NEUTROABS 0.0 (L) 03/15/2016    CMP Latest Ref Rng & Units 03/15/2016 03/14/2016 03/13/2016  Glucose 65 - 99 mg/dL 110(H) 107(H) 112(H)  BUN 6 - 20 mg/dL _0 Creatinine 0.44 - 1.00 mg/dL 0.75 0.70 0.86  Sodium 135 - 145 mmol/L 131(L) 133(L) 131(L)  Potassium 3.5 - 5.1 mmol/L 3.7 3.4(L) 3.4(L)  Chloride 101 - 111 mmol/L 104 104 104  CO2 22 - 32 mmol/L _1 Calcium 8.9 - 10.3 mg/dL 7.9(L) 7.7(L) 7.5(L)  Total Protein 6.5 - 8.1 g/dL - - -  Total Bilirubin 0.3 - 1.2 mg/dL - - -  Alkaline Phos 38 - 126 U/L - - -  AST 15 - 41 U/L - - -  ALT 14 - 54 U/L - - -     Urine Studies No results for input(s): UHGB, CRYS in the last 72 hours.  Invalid input(s): UACOL, UAPR, USPG, UPH, UTP, UGL, UKET, UBIL, UNIT, UROB, [place], UEPI, UWBC, Junie Panning [place], [place], [place]  Basic  Metabolic Panel:  Recent Labs Lab 03/11/16 0236 03/12/16 0537 03/13/16 0548 03/14/16 0526 03/15/16 0450  NA 131* 135 131* 133* 131*  K 3.4* 3.2* 3.4* 3.4* 3.7  CL 101 108 104 104 104  CO2 _2 GLUCOSE 103* 111* 112* 107* 110*  BUN _3 CREATININE 0.80 0.66 0.86 0.70 0.75  CALCIUM 7.7* 7.3* 7.5* 7.7* 7.9*  PHOS  --   --   --   --  1.8*   GFR Estimated Creatinine Clearance: 55.9 mL/min (by C-G formula based on SCr of 0.8 mg/dL). Liver Function Tests:  Recent Labs Lab 03/10/16 1005 03/11/16 0236  AST 31 28  ALT 43 39  ALKPHOS 67 58  BILITOT 1.1 1.4*  PROT 6.9 5.8*  ALBUMIN 3.3* 2.6*   No results for input(s): LIPASE, AMYLASE in the last 168 hours. No results for input(s):  AMMONIA in the last 168 hours. Coagulation profile  Recent Labs Lab 03/11/16 0855  INR 1.33    CBC:  Recent Labs Lab 03/10/16 1005 03/11/16 0236 03/12/16 0537 03/13/16 0548 03/14/16 0526 03/15/16 0450  WBC 2.9* 1.3* 0.8* 0.6* 0.3* 0.3*  NEUTROABS 2.1 1.0* 0.6* 0.4*  --  0.0*  HGB 11.4* 10.8* 9.0* 8.4* 8.6* 8.6*  HCT 33.3* 31.4* 26.1* 23.7* 24.1* 24.2*  MCV 102.1* 101.6* 101.2* 97.5 96.0 98.8  PLT 81* 57* 31* 26* 23* 18*   Cardiac Enzymes:  Recent Labs Lab 03/10/16 1451 03/10/16 2006 03/11/16 0236  TROPONINI <0.03 <0.03 <0.03   BNP: Invalid input(s): POCBNP CBG:  Recent Labs Lab 03/15/16 0754  GLUCAP 107*   D-Dimer No results for input(s): DDIMER in the last 72 hours. Hgb A1c No results for input(s): HGBA1C in the last 72 hours. Lipid Profile No results for input(s): CHOL, HDL, LDLCALC, TRIG, CHOLHDL, LDLDIRECT in the last 72 hours. Thyroid function studies No results for input(s): TSH, T4TOTAL, T3FREE, THYROIDAB in the last 72 hours.  Invalid input(s): FREET3 Anemia work up No results for input(s): VITAMINB12, FOLATE, FERRITIN, TIBC, IRON, RETICCTPCT in the last 72 hours. Microbiology Recent Results (from the past 240 hour(s))  Urine  culture     Status: None   Collection Time: 03/10/16  9:54 AM  Result Value Ref Range Status   Specimen Description URINE, RANDOM  Final   Special Requests NONE  Final   Culture NO GROWTH Performed at [facility]   Final   Report Status 03/11/2016 FINAL  Final  Culture, blood (routine x 2)     Status: None (Preliminary result)   Collection Time: 03/10/16  9:12 PM  Result Value Ref Range Status   Specimen Description BLOOD LEFT ARM  Final   Special Requests BOTTLES DRAWN AEROBIC AND ANAEROBIC 5CC  Final   Culture   Final    NO GROWTH 4 DAYS Performed at [facility]    Report Status PENDING  Incomplete  Culture, blood (routine x 2)     Status: None (Preliminary result)   Collection Time: 03/10/16  9:18 PM  Result Value Ref Range Status   Specimen Description BLOOD LEFT ARM  Final   Special Requests BOTTLES DRAWN AEROBIC AND ANAEROBIC 5CC  Final   Culture   Final    NO GROWTH 4 DAYS Performed at [facility]    Report Status PENDING  Incomplete  Culture, sputum-assessment     Status: None   Collection Time: 03/11/16  4:24 PM  Result Value Ref Range Status   Specimen Description SPUTUM  Final   Special Requests NONE  Final   Sputum evaluation   Final    THIS SPECIMEN IS ACCEPTABLE. RESPIRATORY CULTURE REPORT TO FOLLOW.   Report Status 03/11/2016 FINAL  Final  Culture, respiratory (NON-Expectorated)     Status: None   Collection Time: 03/11/16  4:24 PM  Result Value Ref Range Status   Specimen Description SPUTUM  Final   Special Requests NONE  Final   Gram Stain   Final    ABUNDANT WBC PRESENT,BOTH PMN AND MONONUCLEAR FEW HYPHAE RARE GRAM NEGATIVE RODS RARE GRAM POSITIVE RODS FEW GRAM POSITIVE COCCI    Culture   Final    Consistent with normal respiratory flora. Performed at [facility]    Report Status 03/13/2016 FINAL  Final      Studies:  No results found.  Assessment: 75 y.o. female with history of multiple  myeloma status post  autotransplant in August 2014, recently had disease relapse, and switched to new chemotherapy treatment with pomalidomide, dexa and daratumumab    1. HCAP with hypoxic respiratory failure, improving  2. Sepsis with hypotension, improved  3. Multiple myeloma, relapsed in July 2017, on new chemo  4. Pancytopenia due to chemotherapy, worsening  5. Coronary artery disease, history of STEMI in August 2015  Plan:  -Due to the episode of fever last night, vancomycin was restarted, continue cefepime -continue Granix, increase to 480 g daily until neutropenia resolves -1u plt transfusion today, consider blood transfusion if Hb<7.5 or Plt <20K -I will follow up closely. I will ask my partner Dr. Lindi Adie to see her over the weekend.   I appreciate excellent care from the hospitalist team, I spoke with Dr. Waldron Labs today.   [name], MD 03/15/2016  11:45 AM

## 2016-03-15 NOTE — Telephone Encounter (Signed)
Patient is in the hospital at this time. Will see if patient can get a lipid panel while there. Will route to Dr. Johnsie Cancel.

## 2016-03-15 NOTE — Progress Notes (Signed)
PROGRESS NOTE                                                                                                                                                                                                             Patient Demographics:    Shannon Rush, is a 75 y.o. female, DOB - Oct 15, 1940, MAU:[id number]  Admit date - 03/10/2016   Admitting Physician Costin Karlyne Greenspan, MD  Outpatient Primary MD for the patient is [name], MD  LOS - 5  Outpatient Specialists: Dr Burr Medico  Chief Complaint  Patient presents with  . Shortness of Breath    Immunocompromise       Brief Narrative   75 y/o  female with history of multiple myeloma on chemotherapy, coronary artery disease with history of STE MI in August 2016 status post thrombolytic, hyperlipidemia, osteoporosis presented to the ED with shortness of breath for the past 5-6 days associated with chest congestion, productive cough and chills. He saw her PCP 3 days back and was prescribed Levaquin without any improvement. Patient admitted with sepsis and pancytopenia secondary to lobar pneumonia.   Subjective:   Patient reports she is feeling much better, cough is subsiding, still productive with yellow phlegm, afebrile over last 24 hours   Assessment  & Plan :    Sepsis ([place]) - Secondary to healthcare associated pneumonia with associated hypoxic respiratory failure. - Received aggressive IV hydration. Blood pressure now improved. -  On empiric vancomycin and cefepime, stopped vancomycin 8/31 - Culture so far negative. - Agent with fever 100.4 today, will continue to monitor  Acute hypoxic respiratory failure - Secondary to HCAP and mild fluid overload. - Resolved, Currently on room air.  Pancytopenia (HCC)/neutropenia -Secondary to recent chemotherapy. Dr Burr Medico recommends daily IV granix until neutropenia resolved. ( 1st dose on 8/28) Monitor CBC daily.  Maintain neutropenic precautions. - With thrombocytopenia, platelet count today is 18 K, will transfuse 1 unit of platelets   Multiple myeloma (HCC) - Follows with Dr Burr Medico  Coronary atherosclerosis of native coronary artery - Continue Toprol. hold Plavix given thrombocytopenia  Protein-calorie malnutrition ([place]) - Continue with ensure  Hypokalemia Replenished      Code Status : Full code  Family Communication  : Husband at bedside  Disposition Plan  : Home when sepsis/neutropenia resolve.  Consults  : Dr Burr Medico  Procedures  : None  DVT Prophylaxis  :  SCDs  Lab Results  Component Value Date   PLT 18 (LL) 03/15/2016    Antibiotics  :    Anti-infectives    Start     Dose/Rate Route Frequency Ordered Stop   03/12/16 2200  vancomycin (VANCOCIN) IVPB 1000 mg/200 mL premix  Status:  Discontinued     1,000 mg 200 mL/hr over 60 Minutes Intravenous Every 12 hours 03/12/16 1246 03/14/16 0937   03/12/16 1245  vancomycin (VANCOCIN) IVPB 1000 mg/200 mL premix     1,000 mg 200 mL/hr over 60 Minutes Intravenous STAT 03/12/16 1233 03/12/16 1348   03/12/16 1200  ceFEPIme (MAXIPIME) 2 g in dextrose 5 % 50 mL IVPB     2 g 100 mL/hr over 30 Minutes Intravenous Every 8 hours 03/12/16 0843     03/11/16 0000  vancomycin (VANCOCIN) IVPB 750 mg/150 ml premix  Status:  Discontinued     750 mg 150 mL/hr over 60 Minutes Intravenous Every 12 hours 03/10/16 1205 03/12/16 1233   03/10/16 2200  acyclovir (ZOVIRAX) tablet 400 mg     400 mg Oral 2 times daily 03/10/16 1511     03/10/16 2000  ceFEPIme (MAXIPIME) 1 g in dextrose 5 % 50 mL IVPB  Status:  Discontinued     1 g 100 mL/hr over 30 Minutes Intravenous Every 8 hours 03/10/16 1307 03/12/16 0843   03/10/16 1400  aztreonam (AZACTAM) 2 g in dextrose 5 % 50 mL IVPB  Status:  Discontinued     2 g 100 mL/hr over 30 Minutes Intravenous Every 8 hours 03/10/16 1341 03/10/16 1344   03/10/16 1115  ceFEPIme (MAXIPIME) 2 g in dextrose 5 % 50 mL  IVPB     2 g 100 mL/hr over 30 Minutes Intravenous STAT 03/10/16 1100 03/10/16 1306   03/10/16 1100  vancomycin (VANCOCIN) IVPB 750 mg/150 ml premix     750 mg 150 mL/hr over 60 Minutes Intravenous STAT 03/10/16 1057 03/10/16 1406   03/10/16 1045  aztreonam (AZACTAM) 2 g in dextrose 5 % 50 mL IVPB  Status:  Discontinued     2 g 100 mL/hr over 30 Minutes Intravenous  Once 03/10/16 1042 03/10/16 1100        Objective:   Vitals:   03/15/16 0900 03/15/16 1200 03/15/16 1253 03/15/16 1314  BP:  (!) 90/58 (!) 92/59 (!) 105/54  Pulse:  95 86 96  Resp:   18 18  Temp: 99.3 F (37.4 C) 97.6 F (36.4 C) 97.5 F (36.4 C) 97.9 F (36.6 C)  TempSrc: Oral Oral Oral Oral  SpO2:  100% 99% 96%  Weight:      Height:        Wt Readings from Last 3 Encounters:  03/10/16 56.5 kg (124 lb 9 oz)  03/07/16 56.5 kg (124 lb 9.6 oz)  02/15/16 55.6 kg (122 lb 9.6 oz)     Intake/Output Summary (Last 24 hours) at 03/15/16 1323 Last data filed at 03/15/16 1300  Gross per 24 hour  Intake              460 ml  Output                0 ml  Net              460 ml     Physical Exam  Gen: not in distress HEENT: no pallor, Moist mucosa supple neck Chest: Fine bibasilar crackles CVS:  N S1&S2, no murmurs, rubs or gallop GI: soft, NT, ND, BS+ Musculoskeletal: warm, no edema CNS: Alert and oriented    Data Review:    CBC  Recent Labs Lab 03/10/16 1005 03/11/16 0236 03/12/16 0537 03/13/16 0548 03/14/16 0526 03/15/16 0450  WBC 2.9* 1.3* 0.8* 0.6* 0.3* 0.3*  HGB 11.4* 10.8* 9.0* 8.4* 8.6* 8.6*  HCT 33.3* 31.4* 26.1* 23.7* 24.1* 24.2*  PLT 81* 57* 31* 26* 23* 18*  MCV 102.1* 101.6* 101.2* 97.5 96.0 98.8  MCH 35.0* 35.0* 34.9* 34.6* 34.3* 35.1*  MCHC 34.2 34.4 34.5 35.4 35.7 35.5  RDW 15.5 15.7* 15.6* 15.2 15.2 15.3  LYMPHSABS 0.3* 0.1* 0.1* 0.1*  --  0.2*  MONOABS 0.2 0.1 0.0* 0.0*  --  0.0*  EOSABS 0.3 0.1 0.1 0.1  --  0.1  BASOSABS 0.0 0.0 0.0 0.0  --  0.0    Chemistries    Recent Labs Lab 03/10/16 1005 03/11/16 0236 03/12/16 0537 03/13/16 0548 03/14/16 0526 03/15/16 0450  NA 134* 131* 135 131* 133* 131*  K 3.4* 3.4* 3.2* 3.4* 3.4* 3.7  CL 103 101 108 104 104 104  CO2 26 27 24 23 23 24   GLUCOSE 97 103* 111* 112* 107* 110*  BUN 15 15 13 17 14 17   CREATININE 0.81 0.80 0.66 0.86 0.70 0.75  CALCIUM 8.3* 7.7* 7.3* 7.5* 7.7* 7.9*  AST 31 28  --   --   --   --   ALT 43 39  --   --   --   --   ALKPHOS 67 58  --   --   --   --   BILITOT 1.1 1.4*  --   --   --   --    ------------------------------------------------------------------------------------------------------------------ No results for input(s): CHOL, HDL, LDLCALC, TRIG, CHOLHDL, LDLDIRECT in the last 72 hours.  No results found for: HGBA1C ------------------------------------------------------------------------------------------------------------------ No results for input(s): TSH, T4TOTAL, T3FREE, THYROIDAB in the last 72 hours.  Invalid input(s): FREET3 ------------------------------------------------------------------------------------------------------------------ No results for input(s): VITAMINB12, FOLATE, FERRITIN, TIBC, IRON, RETICCTPCT in the last 72 hours.  Coagulation profile  Recent Labs Lab 03/11/16 0855  INR 1.33    No results for input(s): DDIMER in the last 72 hours.  Cardiac Enzymes  Recent Labs Lab 03/10/16 1451 03/10/16 2006 03/11/16 0236  TROPONINI <0.03 <0.03 <0.03   ------------------------------------------------------------------------------------------------------------------ No results found for: BNP  Inpatient Medications  Scheduled Meds: . sodium chloride   Intravenous Once  . acyclovir  400 mg Oral BID  . atorvastatin  40 mg Oral Daily  . benzonatate  100 mg Oral Once  . calcium carbonate  1,250 mg Oral Daily  . ceFEPime (MAXIPIME) IV  2 g Intravenous Q8H  . feeding supplement (ENSURE ENLIVE)  237 mL Oral TID BM  . fluticasone  2 spray  Each Nare Daily  . furosemide  60 mg Intravenous Once  . guaiFENesin  600 mg Oral BID  . ipratropium-albuterol  3 mL Nebulization BID  . metoprolol succinate  25 mg Oral Daily  . sodium phosphate  Dextrose 5% IVPB  30 mmol Intravenous Once  . Tbo-filgastrim (GRANIX) SQ  480 mcg Subcutaneous q1800   Continuous Infusions:  PRN Meds:.acetaminophen, albuterol, guaiFENesin-dextromethorphan, morphine, ondansetron, prochlorperazine, zolpidem  Micro Results Recent Results (from the past 240 hour(s))  Urine culture     Status: None   Collection Time: 03/10/16  9:54 AM  Result Value Ref Range Status   Specimen Description URINE, RANDOM  Final   Special Requests  NONE  Final   Culture NO GROWTH Performed at [facility]   Final   Report Status 03/11/2016 FINAL  Final  Culture, blood (routine x 2)     Status: None (Preliminary result)   Collection Time: 03/10/16  9:12 PM  Result Value Ref Range Status   Specimen Description BLOOD LEFT ARM  Final   Special Requests BOTTLES DRAWN AEROBIC AND ANAEROBIC 5CC  Final   Culture   Final    NO GROWTH 4 DAYS Performed at [facility]    Report Status PENDING  Incomplete  Culture, blood (routine x 2)     Status: None (Preliminary result)   Collection Time: 03/10/16  9:18 PM  Result Value Ref Range Status   Specimen Description BLOOD LEFT ARM  Final   Special Requests BOTTLES DRAWN AEROBIC AND ANAEROBIC 5CC  Final   Culture   Final    NO GROWTH 4 DAYS Performed at [facility]    Report Status PENDING  Incomplete  Culture, sputum-assessment     Status: None   Collection Time: 03/11/16  4:24 PM  Result Value Ref Range Status   Specimen Description SPUTUM  Final   Special Requests NONE  Final   Sputum evaluation   Final    THIS SPECIMEN IS ACCEPTABLE. RESPIRATORY CULTURE REPORT TO FOLLOW.   Report Status 03/11/2016 FINAL  Final  Culture, respiratory (NON-Expectorated)     Status: None   Collection Time: 03/11/16  4:24 PM   Result Value Ref Range Status   Specimen Description SPUTUM  Final   Special Requests NONE  Final   Gram Stain   Final    ABUNDANT WBC PRESENT,BOTH PMN AND MONONUCLEAR FEW HYPHAE RARE GRAM NEGATIVE RODS RARE GRAM POSITIVE RODS FEW GRAM POSITIVE COCCI    Culture   Final    Consistent with normal respiratory flora. Performed at [facility]    Report Status 03/13/2016 FINAL  Final    Radiology Reports Dg Chest 2 View  Result Date: 03/10/2016 CLINICAL DATA:  Cough and shortness of breath.  Chest pain EXAM: CHEST  2 VIEW COMPARISON:  11/02/2014 FINDINGS: Normal heart size. Advanced chronic interstitial lung changes are again identified. Superimposed bibasilar opacities are identified. There are small pleural effusions present. Chronic multi level compression deformities are identified within the thoracic spine. IMPRESSION: 1. Bibasilar opacities which may represent atelectasis and/or pneumonia. 2. Small bilateral pleural effusions. Electronically Signed   By: Kerby Moors M.D.   On: 03/10/2016 10:25   Dg Chest Port 1 View  Result Date: 03/11/2016 CLINICAL DATA:  Productive cough and chest congestion for the past 2 weeks. Fever and hypoxia. Shortness of breath. Multiple myeloma. EXAM: PORTABLE CHEST 1 VIEW COMPARISON:  Yesterday. FINDINGS: Increased patchy density at both lung bases. Small bilateral pleural effusions. Diffuse osteopenia. Old, healed left clavicle fracture and old, healed left rib fractures. Previously described chronic thoracic vertebral compression deformities. IMPRESSION: Increased probable pneumonia at both lung bases with increased bilateral pleural fluid. Electronically Signed   By: Claudie Revering M.D.   On: 03/11/2016 18:18     Salah Nakamura M.D on 03/15/2016 at 1:23 PM  Between 7am to 7pm - Pager - [phone]  After 7pm go to [email or web address] - password [facility]  [facility] -  Office  [phone]

## 2016-03-15 NOTE — Progress Notes (Signed)
Pharmacy Antibiotic Note  Shannon Rush is a 75 y.o. female with PMH of multiple myeloma admitted on 03/10/2016 with HCAP.  She continues on day#6 Cefepime per pharmacy dosing.  Tmax: 100.F WBC low, 0.3; ANC 0.0 SCr 0.75 with CrCl ~ 3m/min  Plan: Continue Cefepime to 2g IV q8h due to fever, neutropenia.  No further dose adjustments anticipated- pharmacy to sign off.  Height: 5' 6"  (167.6 cm) Weight: 124 lb 9 oz (56.5 kg) IBW/kg (Calculated) : 59.3  Temp (24hrs), Avg:99.4 F (37.4 C), Min:98.8 F (37.1 C), Max:100.4 F (38 C)   Recent Labs Lab 03/10/16 1014 03/11/16 0236 03/11/16 0848 03/11/16 1225 03/12/16 0537 03/12/16 1142 03/13/16 0548 03/14/16 0526 03/15/16 0450  WBC  --  1.3*  --   --  0.8*  --  0.6* 0.3* 0.3*  CREATININE  --  0.80  --   --  0.66  --  0.86 0.70 0.75  LATICACIDVEN 1.48  --  1.4 1.3  --   --   --   --   --   VANCOTROUGH  --   --   --   --   --  10*  --   --   --     Estimated Creatinine Clearance: 55.9 mL/min (by C-G formula based on SCr of 0.8 mg/dL).    Allergies  Allergen Reactions  . Zosyn [Piperacillin Sod-Tazobactam So] Rash    Itching, rash and swelling of extremities  . Piperacillin-Tazobactam In Dex Rash  . Zithromax [Azithromycin]     Full body rash  . Ciprofloxacin Rash    Throwing up and stomach pain     Antimicrobials this admission: 8/27 >> Vancomycin >>8/31 8/27 >> Cefepime >>  Dose adjustments this admission: 8/29 1142 VT: 10 mcg/mL on 7538mq12h, increase to 1g q12h  Microbiology results: 8/27 BCx: NGTD 8/27 UCx: NGF 8/28 Sputum: rare GNR, GPR, GPC, cx reincubated for better growth  Thank you for allowing pharmacy to be a part of this patient's care.  MiNetta CedarsPharmD, BCPS Pager: [phone]/29/2017 12:50 PM

## 2016-03-16 ENCOUNTER — Inpatient Hospital Stay (HOSPITAL_COMMUNITY): Payer: Medicare Other

## 2016-03-16 LAB — CBC WITH DIFFERENTIAL/PLATELET
Basophils Absolute: 0 10*3/uL (ref 0.0–0.1)
Basophils Relative: 3 %
EOS ABS: 0 10*3/uL (ref 0.0–0.7)
EOS PCT: 0 %
HCT: 23.1 % — ABNORMAL LOW (ref 36.0–46.0)
Hemoglobin: 8.2 g/dL — ABNORMAL LOW (ref 12.0–15.0)
Lymphocytes Relative: 43 %
Lymphs Abs: 0.1 10*3/uL — ABNORMAL LOW (ref 0.7–4.0)
MCH: 35 pg — ABNORMAL HIGH (ref 26.0–34.0)
MCHC: 35.5 g/dL (ref 30.0–36.0)
MCV: 98.7 fL (ref 78.0–100.0)
MONO ABS: 0 10*3/uL — AB (ref 0.1–1.0)
Monocytes Relative: 13 %
NEUTROS PCT: 41 %
Neutro Abs: 0.2 10*3/uL — ABNORMAL LOW (ref 1.7–7.7)
PLATELETS: 34 10*3/uL — AB (ref 150–400)
RBC: 2.34 MIL/uL — AB (ref 3.87–5.11)
RDW: 15.4 % (ref 11.5–15.5)
WBC: 0.3 10*3/uL — AB (ref 4.0–10.5)

## 2016-03-16 LAB — URINALYSIS, ROUTINE W REFLEX MICROSCOPIC
Bilirubin Urine: NEGATIVE
Glucose, UA: 100 mg/dL — AB
KETONES UR: NEGATIVE mg/dL
LEUKOCYTES UA: NEGATIVE
NITRITE: NEGATIVE
PROTEIN: 100 mg/dL — AB
Specific Gravity, Urine: 1.029 (ref 1.005–1.030)
pH: 6 (ref 5.0–8.0)

## 2016-03-16 LAB — CULTURE, BLOOD (ROUTINE X 2)
Culture: NO GROWTH
Culture: NO GROWTH

## 2016-03-16 LAB — PHOSPHORUS: PHOSPHORUS: 2 mg/dL — AB (ref 2.5–4.6)

## 2016-03-16 LAB — BASIC METABOLIC PANEL
ANION GAP: 6 (ref 5–15)
BUN: 18 mg/dL (ref 6–20)
CHLORIDE: 101 mmol/L (ref 101–111)
CO2: 24 mmol/L (ref 22–32)
CREATININE: 0.66 mg/dL (ref 0.44–1.00)
Calcium: 7.6 mg/dL — ABNORMAL LOW (ref 8.9–10.3)
GFR calc non Af Amer: >60 mL/min (ref >60)
Glucose, Bld: 127 mg/dL — ABNORMAL HIGH (ref 65–99)
Potassium: 3.3 mmol/L — ABNORMAL LOW (ref 3.5–5.1)
SODIUM: 131 mmol/L — AB (ref 135–145)

## 2016-03-16 LAB — URINE MICROSCOPIC-ADD ON: WBC, UA: NONE SEEN WBC/hpf (ref 0–5)

## 2016-03-16 LAB — MAGNESIUM: Magnesium: 1.7 mg/dL (ref 1.7–2.4)

## 2016-03-16 LAB — VITAMIN B12: VITAMIN B 12: 736 pg/mL (ref 180–914)

## 2016-03-16 LAB — FOLATE: Folate: 12.8 ng/mL (ref >5.9)

## 2016-03-16 MED ORDER — MAGNESIUM SULFATE IN D5W 1-5 GM/100ML-% IV SOLN
1.0000 g | Freq: Once | INTRAVENOUS | Status: AC
  Administered 2016-03-16: 1 g via INTRAVENOUS
  Filled 2016-03-16: qty 100

## 2016-03-16 MED ORDER — SODIUM PHOSPHATES 45 MMOLE/15ML IV SOLN
30.0000 mmol | Freq: Once | INTRAVENOUS | Status: AC
  Administered 2016-03-16: 30 mmol via INTRAVENOUS
  Filled 2016-03-16: qty 10

## 2016-03-16 MED ORDER — POTASSIUM CHLORIDE IN NACL 20-0.9 MEQ/L-% IV SOLN
INTRAVENOUS | Status: DC
  Administered 2016-03-16 – 2016-03-17 (×2): via INTRAVENOUS
  Filled 2016-03-16 (×3): qty 1000

## 2016-03-16 MED ORDER — VANCOMYCIN HCL IN DEXTROSE 1-5 GM/200ML-% IV SOLN
1000.0000 mg | Freq: Two times a day (BID) | INTRAVENOUS | Status: DC
  Administered 2016-03-16 – 2016-03-18 (×5): 1000 mg via INTRAVENOUS
  Filled 2016-03-16 (×7): qty 200

## 2016-03-16 MED ORDER — POTASSIUM CHLORIDE CRYS ER 20 MEQ PO TBCR
20.0000 meq | EXTENDED_RELEASE_TABLET | Freq: Once | ORAL | Status: AC
Start: 2016-03-16 — End: 2016-03-16
  Administered 2016-03-16: 20 meq via ORAL
  Filled 2016-03-16: qty 1

## 2016-03-16 NOTE — Progress Notes (Signed)
Pharmacy Antibiotic Note  Shannon Rush is a 75 y.o. female with PMH of multiple myeloma admitted on 03/10/2016 with HCAP.  She continues on day#7 Cefepime.  Vancomycin is being restarted per pharmacy dosing.    03/16/2016: Tmax: 100.58F WBC low, 0.3; ANC 0.2 SCr 0.66 with CrCl ~ 45m/min  Plan: Continue Cefepime to 2g IV q8h due to fever, neutropenia.  Resume Vancomycin 1gm IV q12h Check Vancomycin trough at steady state Monitor renal function and cx data   Height: 5' 6"  (167.6 cm) Weight: 124 lb 9 oz (56.5 kg) IBW/kg (Calculated) : 59.3  Temp (24hrs), Avg:98.5 F (36.9 C), Min:97.5 F (36.4 C), Max:99.7 F (37.6 C)   Recent Labs Lab 03/10/16 1014  03/11/16 0848 03/11/16 1225 03/12/16 0537 03/12/16 1142 03/13/16 0548 03/14/16 0526 03/15/16 0450 03/16/16 0523  WBC  --   < >  --   --  0.8*  --  0.6* 0.3* 0.3* 0.3*  CREATININE  --   < >  --   --  0.66  --  0.86 0.70 0.75 0.66  LATICACIDVEN 1.48  --  1.4 1.3  --   --   --   --   --   --   VANCOTROUGH  --   --   --   --   --  10*  --   --   --   --   < > = values in this interval not displayed.  Estimated Creatinine Clearance: 55.9 mL/min (by C-G formula based on SCr of 0.8 mg/dL).    Allergies  Allergen Reactions  . Zosyn [Piperacillin Sod-Tazobactam So] Rash    Itching, rash and swelling of extremities  . Piperacillin-Tazobactam In Dex Rash  . Zithromax [Azithromycin]     Full body rash  . Ciprofloxacin Rash    Throwing up and stomach pain     Antimicrobials this admission: 8/27 >> Vancomycin >>8/31; 9/2>> 8/27 >> Cefepime >>  Dose adjustments this admission: 8/29 1142 VT: 10 mcg/mL on 7573mq12h, increase to 1g q12h  Microbiology results: 8/27 BCx: NGTD 8/27 UCx: NGF 8/28 Sputum: rare GNR, GPR, GPC, cx reincubated for better growth  Thank you for allowing pharmacy to be a part of this patient's care.  MiNetta CedarsPharmD, BCPS Pager: [phone]/29/2017 12:50 PM

## 2016-03-16 NOTE — Progress Notes (Addendum)
PROGRESS NOTE                                                                                                                                                                                                             Patient Demographics:    Shannon Rush, is a 75 y.o. female, DOB - 11/14/1940, MEB:[id number]  Admit date - 03/10/2016   Admitting Physician Costin Karlyne Greenspan, MD  Outpatient Primary MD for the patient is Shannon Gottron, MD  LOS - 6  Outpatient Specialists: Dr Burr Medico  Chief Complaint  Patient presents with  . Shortness of Breath    Immunocompromise       Brief Narrative   75 y/o  female with history of multiple myeloma on chemotherapy, coronary artery disease with history of STE MI in August 2016 status post thrombolytic, hyperlipidemia, osteoporosis presented to the ED with shortness of breath for the past 5-6 days associated with chest congestion, productive cough and chills. He saw her PCP 3 days back and was prescribed Levaquin without any improvement. Patient admitted with sepsis and pancytopenia secondary to lobar pneumonia.   Subjective:   Patient reports she is feeling much better, cough is subsiding,Currently nonproductive.   Assessment  & Plan :    Sepsis ([place]) - Secondary to healthcare associated pneumonia with associated hypoxic respiratory failure. - Received aggressive IV hydration. Blood pressure now improved. -  On empiric vancomycin and cefepime, stopped vancomycin 8/31 - Culture so far negative. - Patient hypotensive, tachycardic, low-grade temperature again today, so repeat septic workup including 2 view chest x-ray, blood cultures, urinalysis, will resume back on vancomycin.  Acute hypoxic respiratory failure - Secondary to HCAP and mild fluid overload.  Pancytopenia (HCC)/neutropenia -Secondary to recent chemotherapy. Dr Burr Medico recommends daily IV granix until neutropenia  resolved. ( 1st dose on 8/28) Monitor CBC daily. Maintain neutropenic precautions, very poor response despite multiple days on IV, discussed with oncology, will check K-08, folic acid, a NA. - With thrombocytopenia, CO2 1 unit platelet yesterday, platelet count improved from 18 K2 34K.  Multiple myeloma (HCC) - Follows with Dr Burr Medico  Coronary atherosclerosis of native coronary artery - Continue Toprol. hold Plavix given thrombocytopenia  Protein-calorie malnutrition ([place]) - Continue with ensure  Hypokalemia/hypophosphatemia Replenished      Code Status : Full code  Family Communication  : None at  bedside  Disposition Plan  : Home when sepsis/neutropenia resolve.  Consults  : Dr Burr Medico  Procedures  : None  DVT Prophylaxis  :  SCDs  Lab Results  Component Value Date   PLT 34 (L) 03/16/2016    Antibiotics  :    Anti-infectives    Start     Dose/Rate Route Frequency Ordered Stop   03/16/16 0900  vancomycin (VANCOCIN) IVPB 1000 mg/200 mL premix     1,000 mg 200 mL/hr over 60 Minutes Intravenous Every 12 hours 03/16/16 0802     03/12/16 2200  vancomycin (VANCOCIN) IVPB 1000 mg/200 mL premix  Status:  Discontinued     1,000 mg 200 mL/hr over 60 Minutes Intravenous Every 12 hours 03/12/16 1246 03/14/16 0937   03/12/16 1245  vancomycin (VANCOCIN) IVPB 1000 mg/200 mL premix     1,000 mg 200 mL/hr over 60 Minutes Intravenous STAT 03/12/16 1233 03/12/16 1348   03/12/16 1200  ceFEPIme (MAXIPIME) 2 g in dextrose 5 % 50 mL IVPB     2 g 100 mL/hr over 30 Minutes Intravenous Every 8 hours 03/12/16 0843     03/11/16 0000  vancomycin (VANCOCIN) IVPB 750 mg/150 ml premix  Status:  Discontinued     750 mg 150 mL/hr over 60 Minutes Intravenous Every 12 hours 03/10/16 1205 03/12/16 1233   03/10/16 2200  acyclovir (ZOVIRAX) tablet 400 mg     400 mg Oral 2 times daily 03/10/16 1511     03/10/16 2000  ceFEPIme (MAXIPIME) 1 g in dextrose 5 % 50 mL IVPB  Status:  Discontinued     1 g 100  mL/hr over 30 Minutes Intravenous Every 8 hours 03/10/16 1307 03/12/16 0843   03/10/16 1400  aztreonam (AZACTAM) 2 g in dextrose 5 % 50 mL IVPB  Status:  Discontinued     2 g 100 mL/hr over 30 Minutes Intravenous Every 8 hours 03/10/16 1341 03/10/16 1344   03/10/16 1115  ceFEPIme (MAXIPIME) 2 g in dextrose 5 % 50 mL IVPB     2 g 100 mL/hr over 30 Minutes Intravenous STAT 03/10/16 1100 03/10/16 1306   03/10/16 1100  vancomycin (VANCOCIN) IVPB 750 mg/150 ml premix     750 mg 150 mL/hr over 60 Minutes Intravenous STAT 03/10/16 1057 03/10/16 1406   03/10/16 1045  aztreonam (AZACTAM) 2 g in dextrose 5 % 50 mL IVPB  Status:  Discontinued     2 g 100 mL/hr over 30 Minutes Intravenous  Once 03/10/16 1042 03/10/16 1100        Objective:   Vitals:   03/16/16 0644 03/16/16 0925 03/16/16 0927 03/16/16 1259  BP: (!) 95/40   (!) 94/56  Pulse: (!) 109   (!) 102  Resp: 20   20  Temp: 99.7 F (37.6 C)   99.1 F (37.3 C)  TempSrc: Oral   Oral  SpO2: 93% (!) 83% 94% 100%  Weight:      Height:        Wt Readings from Last 3 Encounters:  03/10/16 56.5 kg (124 lb 9 oz)  03/07/16 56.5 kg (124 lb 9.6 oz)  02/15/16 55.6 kg (122 lb 9.6 oz)     Intake/Output Summary (Last 24 hours) at 03/16/16 1305 Last data filed at 03/16/16 0916  Gross per 24 hour  Intake              540 ml  Output             1800  ml  Net            -1260 ml     Physical Exam  Gen: not in distress HEENT: no pallor, Moist mucosa supple neck Chest: Fine bibasilar crackles CVS: N S1&S2, no murmurs, rubs or gallop GI: soft, NT, ND, BS+ Musculoskeletal: warm, no edema CNS: Alert and oriented    Data Review:    CBC  Recent Labs Lab 03/11/16 0236 03/12/16 0537 03/13/16 0548 03/14/16 0526 03/15/16 0450 03/16/16 0523  WBC 1.3* 0.8* 0.6* 0.3* 0.3* 0.3*  HGB 10.8* 9.0* 8.4* 8.6* 8.6* 8.2*  HCT 31.4* 26.1* 23.7* 24.1* 24.2* 23.1*  PLT 57* 31* 26* 23* 18* 34*  MCV 101.6* 101.2* 97.5 96.0 98.8 98.7  MCH 35.0*  34.9* 34.6* 34.3* 35.1* 35.0*  MCHC 34.4 34.5 35.4 35.7 35.5 35.5  RDW 15.7* 15.6* 15.2 15.2 15.3 15.4  LYMPHSABS 0.1* 0.1* 0.1*  --  0.2* 0.1*  MONOABS 0.1 0.0* 0.0*  --  0.0* 0.0*  EOSABS 0.1 0.1 0.1  --  0.1 0.0  BASOSABS 0.0 0.0 0.0  --  0.0 0.0    Chemistries   Recent Labs Lab 03/10/16 1005 03/11/16 0236 03/12/16 0537 03/13/16 0548 03/14/16 0526 03/15/16 0450 03/16/16 0523  NA 134* 131* 135 131* 133* 131* 131*  K 3.4* 3.4* 3.2* 3.4* 3.4* 3.7 3.3*  CL 103 101 108 104 104 104 101  CO2 _0 GLUCOSE 97 103* 111* 112* 107* 110* 127*  BUN _1 CREATININE 0.81 0.80 0.66 0.86 0.70 0.75 0.66  CALCIUM 8.3* 7.7* 7.3* 7.5* 7.7* 7.9* 7.6*  MG  --   --   --   --   --   --  1.7  AST 31 28  --   --   --   --   --   ALT 43 39  --   --   --   --   --   ALKPHOS 67 58  --   --   --   --   --   BILITOT 1.1 1.4*  --   --   --   --   --    ------------------------------------------------------------------------------------------------------------------ No results for input(s): CHOL, HDL, LDLCALC, TRIG, CHOLHDL, LDLDIRECT in the last 72 hours.  No results found for: HGBA1C ------------------------------------------------------------------------------------------------------------------ No results for input(s): TSH, T4TOTAL, T3FREE, THYROIDAB in the last 72 hours.  Invalid input(s): FREET3 ------------------------------------------------------------------------------------------------------------------ No results for input(s): VITAMINB12, FOLATE, FERRITIN, TIBC, IRON, RETICCTPCT in the last 72 hours.  Coagulation profile  Recent Labs Lab 03/11/16 0855  INR 1.33    No results for input(s): DDIMER in the last 72 hours.  Cardiac Enzymes  Recent Labs Lab 03/10/16 1451 03/10/16 2006 03/11/16 0236  TROPONINI <0.03 <0.03 <0.03   ------------------------------------------------------------------------------------------------------------------ No  results found for: BNP  Inpatient Medications  Scheduled Meds: . acyclovir  400 mg Oral BID  . atorvastatin  40 mg Oral Daily  . benzonatate  100 mg Oral Once  . calcium carbonate  1,250 mg Oral Daily  . ceFEPime (MAXIPIME) IV  2 g Intravenous Q8H  . feeding supplement (ENSURE ENLIVE)  237 mL Oral TID BM  . fluticasone  2 spray Each Nare Daily  . furosemide  60 mg Intravenous Once  . guaiFENesin  600 mg Oral BID  . ipratropium-albuterol  3 mL Nebulization BID  . magnesium sulfate 1 - 4 g bolus IVPB  1 g Intravenous Once  .  metoprolol succinate  25 mg Oral Daily  . sodium phosphate  Dextrose 5% IVPB  30 mmol Intravenous Once  . Tbo-filgastrim (GRANIX) SQ  480 mcg Subcutaneous q1800  . vancomycin  1,000 mg Intravenous Q12H   Continuous Infusions: . 0.9 % NaCl with KCl 20 mEq / L 75 mL/hr at 03/16/16 0916   PRN Meds:.acetaminophen, albuterol, guaiFENesin-dextromethorphan, morphine, ondansetron, prochlorperazine, zolpidem  Micro Results Recent Results (from the past 240 hour(s))  Urine culture     Status: None   Collection Time: 03/10/16  9:54 AM  Result Value Ref Range Status   Specimen Description URINE, RANDOM  Final   Special Requests NONE  Final   Culture NO GROWTH Performed at [facility]   Final   Report Status 03/11/2016 FINAL  Final  Culture, blood (routine x 2)     Status: None (Preliminary result)   Collection Time: 03/10/16  9:12 PM  Result Value Ref Range Status   Specimen Description BLOOD LEFT ARM  Final   Special Requests BOTTLES DRAWN AEROBIC AND ANAEROBIC 5CC  Final   Culture   Final    NO GROWTH 4 DAYS Performed at [facility]    Report Status PENDING  Incomplete  Culture, blood (routine x 2)     Status: None (Preliminary result)   Collection Time: 03/10/16  9:18 PM  Result Value Ref Range Status   Specimen Description BLOOD LEFT ARM  Final   Special Requests BOTTLES DRAWN AEROBIC AND ANAEROBIC 5CC  Final   Culture   Final    NO  GROWTH 4 DAYS Performed at [facility]    Report Status PENDING  Incomplete  Culture, sputum-assessment     Status: None   Collection Time: 03/11/16  4:24 PM  Result Value Ref Range Status   Specimen Description SPUTUM  Final   Special Requests NONE  Final   Sputum evaluation   Final    THIS SPECIMEN IS ACCEPTABLE. RESPIRATORY CULTURE REPORT TO FOLLOW.   Report Status 03/11/2016 FINAL  Final  Culture, respiratory (NON-Expectorated)     Status: None   Collection Time: 03/11/16  4:24 PM  Result Value Ref Range Status   Specimen Description SPUTUM  Final   Special Requests NONE  Final   Gram Stain   Final    ABUNDANT WBC PRESENT,BOTH PMN AND MONONUCLEAR FEW HYPHAE RARE GRAM NEGATIVE RODS RARE GRAM POSITIVE RODS FEW GRAM POSITIVE COCCI    Culture   Final    Consistent with normal respiratory flora. Performed at [facility]    Report Status 03/13/2016 FINAL  Final    Radiology Reports Dg Chest 2 View  Result Date: 03/16/2016 CLINICAL DATA:  Fever.  History of multiple myeloma. EXAM: CHEST  2 VIEW COMPARISON:  03/03/2016. FINDINGS: Underlying chronic lung disease with emphysema and pulmonary scarring. Superimposed bilateral infiltrates and small effusions. Numerous bone lesions are again demonstrated consistent with known multiple myeloma. IMPRESSION: Chronic underlying lung disease with superimposed infiltrates and effusions. Multiple bone lesions consistent with known multiple myeloma. Electronically Signed   By: Marijo Sanes M.D.   On: 03/16/2016 08:49   Dg Chest 2 View  Result Date: 03/10/2016 CLINICAL DATA:  Cough and shortness of breath.  Chest pain EXAM: CHEST  2 VIEW COMPARISON:  11/02/2014 FINDINGS: Normal heart size. Advanced chronic interstitial lung changes are again identified. Superimposed bibasilar opacities are identified. There are small pleural effusions present. Chronic multi level compression deformities are identified within the thoracic spine.  IMPRESSION: 1. Bibasilar opacities which may represent atelectasis and/or pneumonia. 2. Small bilateral pleural effusions. Electronically Signed   By: Kerby Moors M.D.   On: 03/10/2016 10:25   Dg Chest Port 1 View  Result Date: 03/11/2016 CLINICAL DATA:  Productive cough and chest congestion for the past 2 weeks. Fever and hypoxia. Shortness of breath. Multiple myeloma. EXAM: PORTABLE CHEST 1 VIEW COMPARISON:  Yesterday. FINDINGS: Increased patchy density at both lung bases. Small bilateral pleural effusions. Diffuse osteopenia. Old, healed left clavicle fracture and old, healed left rib fractures. Previously described chronic thoracic vertebral compression deformities. IMPRESSION: Increased probable pneumonia at both lung bases with increased bilateral pleural fluid. Electronically Signed   By: Claudie Revering M.D.   On: 03/11/2016 18:18     Belita Warsame M.D on 03/16/2016 at 1:05 PM  Between 7am to 7pm - Pager - [phone]  After 7pm go to [email or web address] - password [facility]  [facility] -  Office  [phone]

## 2016-03-17 ENCOUNTER — Inpatient Hospital Stay (HOSPITAL_COMMUNITY): Payer: Medicare Other

## 2016-03-17 DIAGNOSIS — R062 Wheezing: Secondary | ICD-10-CM

## 2016-03-17 DIAGNOSIS — J189 Pneumonia, unspecified organism: Secondary | ICD-10-CM

## 2016-03-17 DIAGNOSIS — D649 Anemia, unspecified: Secondary | ICD-10-CM

## 2016-03-17 DIAGNOSIS — D696 Thrombocytopenia, unspecified: Secondary | ICD-10-CM

## 2016-03-17 LAB — PROCALCITONIN
PROCALCITONIN: 1.05 ng/mL
Procalcitonin: 0.95 ng/mL

## 2016-03-17 LAB — BASIC METABOLIC PANEL
Anion gap: 6 (ref 5–15)
BUN: 18 mg/dL (ref 6–20)
CALCIUM: 7.2 mg/dL — AB (ref 8.9–10.3)
CO2: 24 mmol/L (ref 22–32)
CREATININE: 0.68 mg/dL (ref 0.44–1.00)
Chloride: 101 mmol/L (ref 101–111)
GFR calc non Af Amer: >60 mL/min (ref >60)
Glucose, Bld: 111 mg/dL — ABNORMAL HIGH (ref 65–99)
Potassium: 3.4 mmol/L — ABNORMAL LOW (ref 3.5–5.1)
SODIUM: 131 mmol/L — AB (ref 135–145)

## 2016-03-17 LAB — CBC WITH DIFFERENTIAL/PLATELET
BASOS PCT: 5 %
Basophils Absolute: 0 10*3/uL (ref 0.0–0.1)
Eosinophils Absolute: 0 10*3/uL (ref 0.0–0.7)
Eosinophils Relative: 0 %
HCT: 20.4 % — ABNORMAL LOW (ref 36.0–46.0)
Hemoglobin: 7.3 g/dL — ABNORMAL LOW (ref 12.0–15.0)
LYMPHS ABS: 0.2 10*3/uL — AB (ref 0.7–4.0)
Lymphocytes Relative: 38 %
MCH: 34.8 pg — AB (ref 26.0–34.0)
MCHC: 35.8 g/dL (ref 30.0–36.0)
MCV: 97.1 fL (ref 78.0–100.0)
MONO ABS: 0.1 10*3/uL (ref 0.1–1.0)
Monocytes Relative: 19 %
NEUTROS PCT: 38 %
Neutro Abs: 0.1 10*3/uL — ABNORMAL LOW (ref 1.7–7.7)
PLATELETS: 23 10*3/uL — AB (ref 150–400)
RBC: 2.1 MIL/uL — ABNORMAL LOW (ref 3.87–5.11)
RDW: 15.3 % (ref 11.5–15.5)
WBC: 0.4 10*3/uL — AB (ref 4.0–10.5)

## 2016-03-17 LAB — PHOSPHORUS: PHOSPHORUS: 2.7 mg/dL (ref 2.5–4.6)

## 2016-03-17 LAB — LACTIC ACID, PLASMA: LACTIC ACID, VENOUS: 1.8 mmol/L (ref 0.5–1.9)

## 2016-03-17 LAB — PREPARE RBC (CROSSMATCH)

## 2016-03-17 MED ORDER — K PHOS MONO-SOD PHOS DI & MONO 155-852-130 MG PO TABS
250.0000 mg | ORAL_TABLET | Freq: Every day | ORAL | Status: DC
  Administered 2016-03-17 – 2016-03-25 (×9): 250 mg via ORAL
  Filled 2016-03-17 (×9): qty 1

## 2016-03-17 MED ORDER — FILGRASTIM 480 MCG/1.6ML IJ SOLN
480.0000 ug | INTRAMUSCULAR | Status: DC
  Administered 2016-03-17 – 2016-03-21 (×5): 480 ug via SUBCUTANEOUS
  Filled 2016-03-17 (×13): qty 1.6

## 2016-03-17 MED ORDER — SODIUM CHLORIDE 0.9 % IV SOLN
INTRAVENOUS | Status: DC
  Administered 2016-03-17: 19:00:00 via INTRAVENOUS

## 2016-03-17 MED ORDER — POTASSIUM CHLORIDE CRYS ER 20 MEQ PO TBCR
20.0000 meq | EXTENDED_RELEASE_TABLET | Freq: Every day | ORAL | Status: DC
  Administered 2016-03-18 – 2016-03-29 (×12): 20 meq via ORAL
  Filled 2016-03-17 (×12): qty 1

## 2016-03-17 MED ORDER — SODIUM CHLORIDE 0.9 % IV BOLUS (SEPSIS)
500.0000 mL | Freq: Once | INTRAVENOUS | Status: AC
  Administered 2016-03-17: 500 mL via INTRAVENOUS

## 2016-03-17 MED ORDER — SODIUM CHLORIDE 0.9 % IV SOLN: Freq: Once | INTRAVENOUS | Status: DC

## 2016-03-17 MED ORDER — METHYLPREDNISOLONE SODIUM SUCC 125 MG IJ SOLR
80.0000 mg | Freq: Three times a day (TID) | INTRAMUSCULAR | Status: DC
  Administered 2016-03-17: 80 mg via INTRAVENOUS
  Filled 2016-03-17: qty 2

## 2016-03-17 MED ORDER — SODIUM CHLORIDE 0.9 % IV BOLUS (SEPSIS)
250.0000 mL | Freq: Once | INTRAVENOUS | Status: AC
Start: 2016-03-17 — End: 2016-03-17
  Administered 2016-03-17: 250 mL via INTRAVENOUS

## 2016-03-17 MED ORDER — POTASSIUM CHLORIDE CRYS ER 20 MEQ PO TBCR
40.0000 meq | EXTENDED_RELEASE_TABLET | Freq: Once | ORAL | Status: AC
  Administered 2016-03-17: 40 meq via ORAL
  Filled 2016-03-17: qty 2

## 2016-03-17 MED ORDER — SODIUM CHLORIDE 0.9 % IV BOLUS (SEPSIS): 250.0000 mL | Freq: Once | INTRAVENOUS | Status: DC

## 2016-03-17 MED ORDER — IPRATROPIUM-ALBUTEROL 0.5-2.5 (3) MG/3ML IN SOLN
3.0000 mL | Freq: Four times a day (QID) | RESPIRATORY_TRACT | Status: DC | PRN
  Administered 2016-03-24: 3 mL via RESPIRATORY_TRACT
  Filled 2016-03-17 (×2): qty 3

## 2016-03-17 MED ORDER — PHENYLEPHRINE HCL 10 MG/ML IJ SOLN
0.0000 ug/min | INTRAVENOUS | Status: DC
  Filled 2016-03-17: qty 1

## 2016-03-17 MED ORDER — POTASSIUM CHLORIDE CRYS ER 20 MEQ PO TBCR: 20.0000 meq | EXTENDED_RELEASE_TABLET | Freq: Every day | ORAL | Status: DC

## 2016-03-17 MED ORDER — HYDROCORTISONE NA SUCCINATE PF 100 MG IJ SOLR
100.0000 mg | Freq: Three times a day (TID) | INTRAMUSCULAR | Status: DC
  Administered 2016-03-17 – 2016-03-21 (×11): 100 mg via INTRAVENOUS
  Filled 2016-03-17 (×11): qty 2

## 2016-03-17 NOTE — Progress Notes (Signed)
Pt VS obtained, temp 98.2, HR 82, RR 18, BP 73/43 (sitting in chair). BP rechecked with result of 68/40.  RN assisted back to bed. MD notified, orders obtained for NS fluid bolus of 250cc over one hour. Pt has wheezing present throughout as per earlier assessment. Stacey Drain

## 2016-03-17 NOTE — Progress Notes (Signed)
PROGRESS NOTE                                                                                                                                                                                                             Patient Demographics:    Shannon Rush, is a 75 y.o. female, DOB - 03/27/1941, MBE:[id number]  Admit date - 03/10/2016   Admitting Physician Costin Karlyne Greenspan, MD  Outpatient Primary MD for the patient is [name], MD  LOS - 7  Outpatient Specialists: Dr Burr Medico  Chief Complaint  Patient presents with  . Shortness of Breath    Immunocompromise       Brief Narrative   75 y/o  female with history of multiple myeloma on chemotherapy, coronary artery disease with history of STE MI in August 2016 status post thrombolytic, hyperlipidemia, osteoporosis presented to the ED with shortness of breath for the past 5-6 days associated with chest congestion, productive cough and chills. He saw her PCP 3 days back and was prescribed Levaquin without any improvement. Patient admitted with sepsis and pancytopenia secondary to lobar pneumonia.   Subjective:   Patient reports she is feelingWeak today, febrile over the last 24 hours, still complaining of cough.   Assessment  & Plan :    Sepsis ([place]) - Secondary to healthcare associated pneumonia with associated hypoxic respiratory failure. -  On empiric vancomycin and cefepime, stopped vancomycin 8/31, resumed on 9/2 as she became febrile again. - Culture so far negative. - Remains hypotensive today, septic workup repeated yesterday with no growth to date - Remains hypotensive, will check lactic acid, will check pro-calcitonin, and will give fluid bolus, if no response will transfer to stepdown and start on chest to steroids  HCAP - Patient treated with vancomycin and cefepime, without significant improvement this is most likely due to her immunocompromise  status with neutropenia - Sputum culture growing normal respiratory flora, blood cultures with no growth to date - Would consider CT chest without contrast if no improvement   Acute hypoxic respiratory failure - Secondary to HCAP and mild fluid overload. - Patient with wheezing this a.m., will start on Solu-Medrol  Pancytopenia (HCC)/neutropenia -Secondary to recent chemotherapy. Management per oncology, on IV Granix since admission, changed today to Neupogen  - Monitor CBC daily. Maintain neutropenic precautions, very poor response despite multiple days  on IV, discussed with oncology, S-50 and folic acid within normal limits - Received 20 units platelet on 9/1 for platelet count less than 20 - Patient with anemia, hemoglobin of 7.3, will transfuse 1 unit of PRBC today, it is okay for blood back to transfuse the least incompatible emergency release unit.  Multiple myeloma (HCC) - Follows with Dr Burr Medico  Coronary atherosclerosis of native coronary artery - Continue Toprol. hold Plavix given thrombocytopenia  Protein-calorie malnutrition ([place]) - Continue with ensure  Hypokalemia/hypophosphatemia Replenished      Code Status : Full code  Family Communication  : None at bedside  Disposition Plan  : Home when sepsis/neutropenia resolve.  Consults  : Dr Burr Medico  Procedures  : None  DVT Prophylaxis  :  SCDs  Lab Results  Component Value Date   PLT 23 (LL) 03/17/2016    Antibiotics  :    Anti-infectives    Start     Dose/Rate Route Frequency Ordered Stop   03/16/16 0900  vancomycin (VANCOCIN) IVPB 1000 mg/200 mL premix     1,000 mg 200 mL/hr over 60 Minutes Intravenous Every 12 hours 03/16/16 0802     03/12/16 2200  vancomycin (VANCOCIN) IVPB 1000 mg/200 mL premix  Status:  Discontinued     1,000 mg 200 mL/hr over 60 Minutes Intravenous Every 12 hours 03/12/16 1246 03/14/16 0937   03/12/16 1245  vancomycin (VANCOCIN) IVPB 1000 mg/200 mL premix     1,000 mg 200 mL/hr  over 60 Minutes Intravenous STAT 03/12/16 1233 03/12/16 1348   03/12/16 1200  ceFEPIme (MAXIPIME) 2 g in dextrose 5 % 50 mL IVPB     2 g 100 mL/hr over 30 Minutes Intravenous Every 8 hours 03/12/16 0843     03/11/16 0000  vancomycin (VANCOCIN) IVPB 750 mg/150 ml premix  Status:  Discontinued     750 mg 150 mL/hr over 60 Minutes Intravenous Every 12 hours 03/10/16 1205 03/12/16 1233   03/10/16 2200  acyclovir (ZOVIRAX) tablet 400 mg     400 mg Oral 2 times daily 03/10/16 1511     03/10/16 2000  ceFEPIme (MAXIPIME) 1 g in dextrose 5 % 50 mL IVPB  Status:  Discontinued     1 g 100 mL/hr over 30 Minutes Intravenous Every 8 hours 03/10/16 1307 03/12/16 0843   03/10/16 1400  aztreonam (AZACTAM) 2 g in dextrose 5 % 50 mL IVPB  Status:  Discontinued     2 g 100 mL/hr over 30 Minutes Intravenous Every 8 hours 03/10/16 1341 03/10/16 1344   03/10/16 1115  ceFEPIme (MAXIPIME) 2 g in dextrose 5 % 50 mL IVPB     2 g 100 mL/hr over 30 Minutes Intravenous STAT 03/10/16 1100 03/10/16 1306   03/10/16 1100  vancomycin (VANCOCIN) IVPB 750 mg/150 ml premix     750 mg 150 mL/hr over 60 Minutes Intravenous STAT 03/10/16 1057 03/10/16 1406   03/10/16 1045  aztreonam (AZACTAM) 2 g in dextrose 5 % 50 mL IVPB  Status:  Discontinued     2 g 100 mL/hr over 30 Minutes Intravenous  Once 03/10/16 1042 03/10/16 1100        Objective:   Vitals:   03/17/16 0754 03/17/16 1015 03/17/16 1257 03/17/16 1300  BP:  (!) 92/58 (!) 73/42 (!) 68/40  Pulse:   82   Resp:   18   Temp:   98.2 F (36.8 C)   TempSrc:   Oral   SpO2: 94%  97%  Weight:      Height:        Wt Readings from Last 3 Encounters:  03/10/16 56.5 kg (124 lb 9 oz)  03/07/16 56.5 kg (124 lb 9.6 oz)  02/15/16 55.6 kg (122 lb 9.6 oz)     Intake/Output Summary (Last 24 hours) at 03/17/16 1313 Last data filed at 03/17/16 1236  Gross per 24 hour  Intake          3314.59 ml  Output              300 ml  Net          3014.59 ml     Physical  Exam  Gen: not in distress, frail HEENT: no pallor, Moist mucosa supple neck Chest: Fine bibasilar crackles CVS: N S1&S2, no murmurs, rubs or gallop GI: soft, NT, ND, BS+ Musculoskeletal: warm, mild pitting edema CNS: Alert and oriented    Data Review:    CBC  Recent Labs Lab 03/12/16 0537 03/13/16 0548 03/14/16 0526 03/15/16 0450 03/16/16 0523 03/17/16 0537  WBC 0.8* 0.6* 0.3* 0.3* 0.3* 0.4*  HGB 9.0* 8.4* 8.6* 8.6* 8.2* 7.3*  HCT 26.1* 23.7* 24.1* 24.2* 23.1* 20.4*  PLT 31* 26* 23* 18* 34* 23*  MCV 101.2* 97.5 96.0 98.8 98.7 97.1  MCH 34.9* 34.6* 34.3* 35.1* 35.0* 34.8*  MCHC 34.5 35.4 35.7 35.5 35.5 35.8  RDW 15.6* 15.2 15.2 15.3 15.4 15.3  LYMPHSABS 0.1* 0.1*  --  0.2* 0.1* 0.2*  MONOABS 0.0* 0.0*  --  0.0* 0.0* 0.1  EOSABS 0.1 0.1  --  0.1 0.0 0.0  BASOSABS 0.0 0.0  --  0.0 0.0 0.0    Chemistries   Recent Labs Lab 03/11/16 0236  03/13/16 0548 03/14/16 0526 03/15/16 0450 03/16/16 0523 03/17/16 0537  NA 131*  < > 131* 133* 131* 131* 131*  K 3.4*  < > 3.4* 3.4* 3.7 3.3* 3.4*  CL 101  < > 104 104 104 101 101  CO2 27  < > 23 23 24 24 24   GLUCOSE 103*  < > 112* 107* 110* 127* 111*  BUN 15  < > 17 14 17 18 18   CREATININE 0.80  < > 0.86 0.70 0.75 0.66 0.68  CALCIUM 7.7*  < > 7.5* 7.7* 7.9* 7.6* 7.2*  MG  --   --   --   --   --  1.7  --   AST 28  --   --   --   --   --   --   ALT 39  --   --   --   --   --   --   ALKPHOS 58  --   --   --   --   --   --   BILITOT 1.4*  --   --   --   --   --   --   < > = values in this interval not displayed. ------------------------------------------------------------------------------------------------------------------ No results for input(s): CHOL, HDL, LDLCALC, TRIG, CHOLHDL, LDLDIRECT in the last 72 hours.  No results found for: HGBA1C ------------------------------------------------------------------------------------------------------------------ No results for input(s): TSH, T4TOTAL, T3FREE, THYROIDAB in the last  72 hours.  Invalid input(s): FREET3 ------------------------------------------------------------------------------------------------------------------  Recent Labs  03/16/16 1115  VITAMINB12 736  FOLATE 12.8    Coagulation profile  Recent Labs Lab 03/11/16 0855  INR 1.33    No results for input(s): DDIMER in the last 72 hours.  Cardiac Enzymes  Recent Labs Lab 03/10/16 1451  03/10/16 2006 03/11/16 0236  TROPONINI <0.03 <0.03 <0.03   ------------------------------------------------------------------------------------------------------------------ No results found for: BNP  Inpatient Medications  Scheduled Meds: . sodium chloride   Intravenous Once  . acyclovir  400 mg Oral BID  . atorvastatin  40 mg Oral Daily  . benzonatate  100 mg Oral Once  . calcium carbonate  1,250 mg Oral Daily  . ceFEPime (MAXIPIME) IV  2 g Intravenous Q8H  . feeding supplement (ENSURE ENLIVE)  237 mL Oral TID BM  . filgrastim  480 mcg Subcutaneous Q24H  . fluticasone  2 spray Each Nare Daily  . furosemide  60 mg Intravenous Once  . guaiFENesin  600 mg Oral BID  . ipratropium-albuterol  3 mL Nebulization BID  . methylPREDNISolone (SOLU-MEDROL) injection  80 mg Intravenous Q8H  . phosphorus  250 mg Oral Daily  . [START ON 03/18/2016] potassium chloride  20 mEq Oral Daily  . sodium chloride  250 mL Intravenous Once  . vancomycin  1,000 mg Intravenous Q12H   Continuous Infusions: . 0.9 % NaCl with KCl 20 mEq / L 50 mL/hr at 03/17/16 1022   PRN Meds:.acetaminophen, guaiFENesin-dextromethorphan, ipratropium-albuterol, morphine, ondansetron, prochlorperazine, zolpidem  Micro Results Recent Results (from the past 240 hour(s))  Urine culture     Status: None   Collection Time: 03/10/16  9:54 AM  Result Value Ref Range Status   Specimen Description URINE, RANDOM  Final   Special Requests NONE  Final   Culture NO GROWTH Performed at [facility]   Final   Report Status 03/11/2016  FINAL  Final  Culture, blood (routine x 2)     Status: None   Collection Time: 03/10/16  9:12 PM  Result Value Ref Range Status   Specimen Description BLOOD LEFT ARM  Final   Special Requests BOTTLES DRAWN AEROBIC AND ANAEROBIC 5CC  Final   Culture   Final    NO GROWTH 5 DAYS Performed at [facility]    Report Status 03/16/2016 FINAL  Final  Culture, blood (routine x 2)     Status: None   Collection Time: 03/10/16  9:18 PM  Result Value Ref Range Status   Specimen Description BLOOD LEFT ARM  Final   Special Requests BOTTLES DRAWN AEROBIC AND ANAEROBIC 5CC  Final   Culture   Final    NO GROWTH 5 DAYS Performed at [facility]    Report Status 03/16/2016 FINAL  Final  Culture, sputum-assessment     Status: None   Collection Time: 03/11/16  4:24 PM  Result Value Ref Range Status   Specimen Description SPUTUM  Final   Special Requests NONE  Final   Sputum evaluation   Final    THIS SPECIMEN IS ACCEPTABLE. RESPIRATORY CULTURE REPORT TO FOLLOW.   Report Status 03/11/2016 FINAL  Final  Culture, respiratory (NON-Expectorated)     Status: None   Collection Time: 03/11/16  4:24 PM  Result Value Ref Range Status   Specimen Description SPUTUM  Final   Special Requests NONE  Final   Gram Stain   Final    ABUNDANT WBC PRESENT,BOTH PMN AND MONONUCLEAR FEW HYPHAE RARE GRAM NEGATIVE RODS RARE GRAM POSITIVE RODS FEW GRAM POSITIVE COCCI    Culture   Final    Consistent with normal respiratory flora. Performed at [facility]    Report Status 03/13/2016 FINAL  Final    Radiology Reports Dg Chest 2 View  Result Date: 03/17/2016 CLINICAL DATA:  Sepsis and cough.  Neutropenia and history of multiple myeloma. EXAM: CHEST  2 VIEW COMPARISON:  03/16/2016 FINDINGS: Bilateral lower lung infiltrates again noted, left greater than right. Associated small bilateral pleural effusions appears stable. Stable chronic lung disease. The heart size is normal. Stable compression  deformities of the thoracic spine and left scapular lesion. Poorly healed left-sided rib fractures also again noted. IMPRESSION: Bilateral lower lobe pulmonary infiltrates, left greater than right, appear relatively stable. Associated small bilateral pleural effusions. Electronically Signed   By: Aletta Edouard M.D.   On: 03/17/2016 11:30   Dg Chest 2 View  Result Date: 03/16/2016 CLINICAL DATA:  Fever.  History of multiple myeloma. EXAM: CHEST  2 VIEW COMPARISON:  03/03/2016. FINDINGS: Underlying chronic lung disease with emphysema and pulmonary scarring. Superimposed bilateral infiltrates and small effusions. Numerous bone lesions are again demonstrated consistent with known multiple myeloma. IMPRESSION: Chronic underlying lung disease with superimposed infiltrates and effusions. Multiple bone lesions consistent with known multiple myeloma. Electronically Signed   By: Marijo Sanes M.D.   On: 03/16/2016 08:49   Dg Chest 2 View  Result Date: 03/10/2016 CLINICAL DATA:  Cough and shortness of breath.  Chest pain EXAM: CHEST  2 VIEW COMPARISON:  11/02/2014 FINDINGS: Normal heart size. Advanced chronic interstitial lung changes are again identified. Superimposed bibasilar opacities are identified. There are small pleural effusions present. Chronic multi level compression deformities are identified within the thoracic spine. IMPRESSION: 1. Bibasilar opacities which may represent atelectasis and/or pneumonia. 2. Small bilateral pleural effusions. Electronically Signed   By: Kerby Moors M.D.   On: 03/10/2016 10:25   Dg Chest Port 1 View  Result Date: 03/11/2016 CLINICAL DATA:  Productive cough and chest congestion for the past 2 weeks. Fever and hypoxia. Shortness of breath. Multiple myeloma. EXAM: PORTABLE CHEST 1 VIEW COMPARISON:  Yesterday. FINDINGS: Increased patchy density at both lung bases. Small bilateral pleural effusions. Diffuse osteopenia. Old, healed left clavicle fracture and old, healed left  rib fractures. Previously described chronic thoracic vertebral compression deformities. IMPRESSION: Increased probable pneumonia at both lung bases with increased bilateral pleural fluid. Electronically Signed   By: Claudie Revering M.D.   On: 03/11/2016 18:18     Nicolis Boody M.D on 03/17/2016 at 1:13 PM  Between 7am to 7pm - Pager - [phone]  After 7pm go to [email or web address] - password [facility]  [facility] -  Office  [phone]

## 2016-03-17 NOTE — Progress Notes (Signed)
Order obtained to transfer patient to stepdown.  Shannon Rush

## 2016-03-17 NOTE — Consult Note (Addendum)
PULMONARY / CRITICAL CARE MEDICINE   Name: Shannon Rush MRN: [id number] DOB: Apr 02, 1941    ADMISSION DATE:  03/10/2016 CONSULTATION DATE:  03/17/16  REFERRING MD:  Noelle Penner MD  CHIEF COMPLAINT:  Hypotension, sepsis  HISTORY OF PRESENT ILLNESS:   Mrs. Shannon Rush is a 75 year old with past medical history of coronary artery disease, multiple myeloma, currently undergoing chemotherapy. She was admitted on 03/10/16 shortness of breath, fevers, chest congestion, productive cough. She had failed outpatient levofloxacin. She was admitted with a diagnosis of healthcare associated pneumonia, hypoxic respiratory failure and started on broad-spectrum antibiotics. She was noted to be pancytopenic with neutropenia.  On the evening of 03/17/16 she became more hypotensive with blood pressure of 68/40, increased work of breathing with wheezing. She was transferred to stepdown for closer monitoring and PCCM called for consult.  She has been started on steroids and had about 250 mL of fluid with some improvement in her blood pressure. Her lactate is normal and chest x-ray does not show any new abnormality except for previous basal infiltrates. She reports very poor by mouth intake over this admission.  PAST MEDICAL HISTORY :  She  has a past medical history of CAD (coronary artery disease); Cord compression ([place]) (07/13/12); History of radiation therapy (07/13/12-07/27/12); echocardiogram; Multiple myeloma ([place]) (07/01/2012); OSTEOPOROSIS (06/11/2010); Thoracic kyphosis (07/13/12); and Unspecified deficiency anemia.  PAST SURGICAL HISTORY: She  has a past surgical history that includes Appendectomy; Tubal ligation; Cesarean section; Hip surgery (2009); Elbow surgery; and Colonoscopy (2007).  Allergies  Allergen Reactions  . Zosyn [Piperacillin Sod-Tazobactam So] Rash    Itching, rash and swelling of extremities  . Piperacillin-Tazobactam In Dex Rash  . Zithromax [Azithromycin]     Full body rash  .  Ciprofloxacin Rash    Throwing up and stomach pain     No current facility-administered medications on file prior to encounter.    Current Outpatient Prescriptions on File Prior to Encounter  Medication Sig  . acetaminophen (TYLENOL) 500 MG tablet Take 500 mg by mouth every 6 (six) hours as needed for moderate pain (pain). Reported on 07/20/2015  . acyclovir (ZOVIRAX) 400 MG tablet Take 1 tablet (400 mg total) by mouth 2 (two) times daily.  . calcium carbonate (OS-CAL) 600 MG TABS tablet Take 600 mg by mouth daily.  . clopidogrel (PLAVIX) 75 MG tablet Take 1 tablet (75 mg total) by mouth daily.  Marland Kitchen dexamethasone (DECADRON) 4 MG tablet Take 1 tablet (4 mg total) by mouth once a week. (Patient taking differently: Take 4-40 mg by mouth See admin instructions. ON TUESDAYS TAKE 40 MG THEN TAKE  4 MG TABLET ONCE DAILY FOR TWO DAYS STARTING THE DAY AFTER  CHEMOTHERAPY)  . fluticasone (FLONASE) 50 MCG/ACT nasal spray Place 2 sprays into both nostrils daily.  Marland Kitchen levofloxacin (LEVAQUIN) 750 MG tablet Take 1 tablet (750 mg total) by mouth daily.  . metoprolol succinate (TOPROL-XL) 25 MG 24 hr tablet Take 1 tablet (25 mg total) by mouth daily.  Marland Kitchen morphine (MSIR) 15 MG tablet Take 1 tablet (15 mg total) by mouth every 6 (six) hours as needed for severe pain.  . nitroGLYCERIN (NITROSTAT) 0.4 MG SL tablet Place under the tongue. Reported on 07/20/2015  . ondansetron (ZOFRAN) 8 MG tablet Take 1 tablet (8 mg total) by mouth 2 (two) times daily as needed (Nausea or vomiting).  . pomalidomide (POMALYST) 4 MG capsule Take 1 capsule (4 mg total) by mouth daily. Take with water on days 1-21. Repeat every 28  days.  Marland Kitchen PROAIR HFA 108 (90 BASE) MCG/ACT inhaler Inhale 2 puffs into the lungs every 6 (six) hours as needed.  . prochlorperazine (COMPAZINE) 10 MG tablet Take 1 tablet (10 mg total) by mouth every 6 (six) hours as needed (Nausea or vomiting).  Marland Kitchen atorvastatin (LIPITOR) 40 MG tablet TAKE 1 TABLET (40 MG TOTAL) BY MOUTH  DAILY.   FAMILY HISTORY:  Her indicated that her mother is deceased. She indicated that her father is deceased. She indicated that her brother is alive.   SOCIAL HISTORY: She  reports that she has never smoked. She has never used smokeless tobacco. She reports that she drinks about 3.5 oz of alcohol per week . She reports that she does not use drugs.  REVIEW OF SYSTEMS:   Complains of generalized malaise, fatigue. Denies any cough, sputum production, fevers, chills, hemoptysis. Denies any chest pain, palpitation. Denies any nausea, vomiting, diarrhea, constipation. All other review of systems are negative.  SUBJECTIVE:    VITAL SIGNS: BP (!) 83/52   Pulse 82   Temp 97.4 F (36.3 C) (Oral)   Resp (!) 24   Ht _0  (1.676 m)   Wt 124 lb 9 oz (56.5 kg)   SpO2 93%   BMI 20.10 kg/m   HEMODYNAMICS:    VENTILATOR SETTINGS:    INTAKE / OUTPUT: I/O last 3 completed shifts: In: 3217.9 [P.O.:240; I.V.:1967.9; IV Piggyback:1010] Out: 1100 [Urine:1100]  PHYSICAL EXAMINATION: General:  Elderly fairly frail lady in no apparent distress Neuro:  Moves all 4 extremities, no focal deficits HEENT:  Dry mucous membranes, no JVD, thyromegaly. Cardiovascular:  Regular rate and rhythm, no murmurs rubs gallops Lungs:  Bilateral rhonchi. No wheeze heard. Abdomen: Soft, positive bowel sounds, nontender, nondistended Musculoskeletal:  Reduced bulk, no edema, reduced capillary refill Skin:  Intact  LABS:  BMET  Recent Labs Lab 03/15/16 0450 03/16/16 0523 03/17/16 0537  NA 131* 131* 131*  K 3.7 3.3* 3.4*  CL 104 101 101  CO2 _1 BUN _2 CREATININE 0.75 0.66 0.68  GLUCOSE 110* 127* 111*    Electrolytes  Recent Labs Lab 03/15/16 0450 03/16/16 0523 03/17/16 0537  CALCIUM 7.9* 7.6* 7.2*  MG  --  1.7  --   PHOS 1.8* 2.0* 2.7    CBC  Recent Labs Lab 03/15/16 0450 03/16/16 0523 03/17/16 0537  WBC 0.3* 0.3* 0.4*  HGB 8.6* 8.2* 7.3*  HCT 24.2* 23.1*  20.4*  PLT 18* 34* 23*    Coag's  Recent Labs Lab 03/11/16 0855  APTT 39*  INR 1.33    Sepsis Markers  Recent Labs Lab 03/11/16 0848 03/11/16 0855 03/11/16 1225 03/17/16 1409  LATICACIDVEN 1.4  --  1.3 1.8  PROCALCITON  --  1.42  --  1.05    ABG No results for input(s): PHART, PCO2ART, PO2ART in the last 168 hours.  Liver Enzymes  Recent Labs Lab 03/11/16 0236  AST 28  ALT 39  ALKPHOS 58  BILITOT 1.4*  ALBUMIN 2.6*    Cardiac Enzymes  Recent Labs Lab 03/10/16 2006 03/11/16 0236  TROPONINI <0.03 <0.03    Glucose  Recent Labs Lab 03/15/16 0754  GLUCAP 107*    Imaging Dg Chest 2 View  Result Date: 03/17/2016 CLINICAL DATA:  Sepsis and cough. Neutropenia and history of multiple myeloma. EXAM: CHEST  2 VIEW COMPARISON:  03/16/2016 FINDINGS: Bilateral lower lung infiltrates again noted, left greater than right. Associated small bilateral pleural effusions appears stable. Stable  chronic lung disease. The heart size is normal. Stable compression deformities of the thoracic spine and left scapular lesion. Poorly healed left-sided rib fractures also again noted. IMPRESSION: Bilateral lower lobe pulmonary infiltrates, left greater than right, appear relatively stable. Associated small bilateral pleural effusions. Electronically Signed   By: Aletta Edouard M.D.   On: 03/17/2016 11:30   STUDIES:  CXR 9/3 > bilateral lower lobe infiltrates. Images reviewed.  CULTURES: Blood cultures 2 9/2 >NGTD Blood cultures 2 8/27 >NGTD Sputum 8/28> Normal resp flora Ucx 8/27> NGTD  ANTIBIOTICS: Vanco 8/27 > 8/31, 9/2> Cefepime 8/27>   SIGNIFICANT EVENTS: 8/27 > Admit with sepsis, HCAP,  9/3 > Transfer to SDU  LINES/TUBES: PIV  DISCUSSION: 75 year old with neutropenic fevers, sepsis from HCAP, immunosuppressed from multiple myeloma, chemotherapy. She has a slow time improving- secondary to neutropenia, debility.   She has been transferred to the stepdown unit  for hypotension and concern for new sepsis. She looks stable on examination and low lactate, improving procalcitonin are reassuring. Her episode of hypotension is mostly likely related to dehydration. She has very PO intake, dry mucus membranes, poor cap refill and is about 5-6 L negative since admission.  ASSESSMENT / PLAN: - Stay the course with antibiotics- Vanco, cefepime. - Follow Pct, cultures. - Gentle hydration. 500cc IVF bolus and then 75cc/hr - Patient would like to avoid CVL. Use peripheral neo first if needed - Change solumedrol to stress dose hydrocortisone for better mineralocorticoid acitivity - Continue nebs  Marshell Garfinkel MD [facility] Pager [phone] If no answer or after 3pm call: [phone] 03/17/2016, 6:58 PM

## 2016-03-17 NOTE — Progress Notes (Signed)
HEMATOLOGY-ONCOLOGY PROGRESS NOTE  SUBJECTIVE:neutropenic fever with sepsis, fevers have come down today,  Generally extremely weak  And tired. Mild wheezing today.  OBJECTIVE: REVIEW OF SYSTEMS:   Constitutional: Denies fevers, chills or abnormal weight loss Eyes: Denies blurriness of vision Ears, nose, mouth, throat, and face: Denies mucositis or sore throat Respiratory: shortness of breath; uses oxygen Cardiovascular: Denies palpitation, chest discomfort Gastrointestinal:  Denies nausea, heartburn or change in bowel habits Skin: Denies abnormal skin rashes Lymphatics: Denies new lymphadenopathy or easy bruising Neurological:generalized weakness Behavioral/Psych: Mood is stable, no new changes feeling exhausted from the hospitalization  Extremities: No lower extremity edema All other systems were reviewed with the patient and are negative.  I have reviewed the past medical history, past surgical history, social history and family history with the patient and they are unchanged from previous note.   PHYSICAL EXAMINATION: ECOG PERFORMANCE STATUS: 3 - Symptomatic, >50% confined to bed  Vitals:   03/16/16 2135 03/17/16 0732  BP: (!) 103/42 (!) 90/42  Pulse: (!) 116 96  Resp: 20 18  Temp: 98.4 F (36.9 C) 98.7 F (37.1 C)   Filed Weights   03/10/16 1447  Weight: 124 lb 9 oz (56.5 kg)    GENERAL:alert, no distress and comfortable SKIN: skin color, texture, turgor are normal, no rashes or significant lesions EYES: normal, Conjunctiva are pink and non-injected, sclera clear OROPHARYNX:no exudate, no erythema and lips, buccal mucosa, and tongue normal  NECK: supple, thyroid normal size, non-tender, without nodularity LYMPH:  no palpable lymphadenopathy in the cervical, axillary or inguinal LUNGS: bilateral wheezes HEART: regular rate & rhythm and no murmurs and no lower extremity edema ABDOMEN:abdomen soft, non-tender and normal bowel sounds Musculoskeletal:no cyanosis of  digits and no clubbing  NEURO: alert & oriented x 3 with fluent speech, no focal motor/sensory deficits  LABORATORY DATA:  I have reviewed the data as listed CMP Latest Ref Rng & Units 03/17/2016 03/16/2016 03/15/2016  Glucose 65 - 99 mg/dL 111(H) 127(H) 110(H)  BUN 6 - 20 mg/dL 18 18 17   Creatinine 0.44 - 1.00 mg/dL 0.68 0.66 0.75  Sodium 135 - 145 mmol/L 131(L) 131(L) 131(L)  Potassium 3.5 - 5.1 mmol/L 3.4(L) 3.3(L) 3.7  Chloride 101 - 111 mmol/L 101 101 104  CO2 22 - 32 mmol/L 24 24 24   Calcium 8.9 - 10.3 mg/dL 7.2(L) 7.6(L) 7.9(L)  Total Protein 6.5 - 8.1 g/dL - - -  Total Bilirubin 0.3 - 1.2 mg/dL - - -  Alkaline Phos 38 - 126 U/L - - -  AST 15 - 41 U/L - - -  ALT 14 - 54 U/L - - -    Lab Results  Component Value Date   WBC 0.4 (LL) 03/17/2016   HGB 7.3 (L) 03/17/2016   HCT 20.4 (L) 03/17/2016   MCV 97.1 03/17/2016   PLT 23 (LL) 03/17/2016   NEUTROABS 0.1 (L) 03/17/2016    ASSESSMENT AND PLAN: 1. Neutropenic fever/sepsis: Continue current antibiotic regimen 2. nonresponsive to Granix: I recommended switching her to Neupogen 480 g daily. 3. No evidence of 0000000 or folic acid deficiencies 4. Bilateral wheezes:Patient will be started on steroids today 5. Encourage the patient to use incentive spirometry and ambulate. 6. Profound neutropenia  Not responding to Neupogen.I suspect daratumumab and Pomolidomide are both profoundly neutropenic.Dr. Burr Medico will plan her treatments depending on resolution of the current neutropenic fever hospitalization. 7. Severe anemia with shortness of breath: Patient likely to get blood transfusion. 8. Severe thrombocytopenia: Being observed

## 2016-03-17 NOTE — Progress Notes (Signed)
CRITICAL VALUE ALERT  Critical value received: Platelets 23   Date of notification:  03/17/16  Time of notification:  0615  Critical value read back: Yes   Nurse who received alert:  Elwin Sleight RN   MD notified (1st page): Floor Coverage -Eulas Post   Time of first page:  0620  MD notified (2nd page):  Time of second page:  Responding MD:    Time MD responded:

## 2016-03-18 ENCOUNTER — Inpatient Hospital Stay (HOSPITAL_COMMUNITY): Payer: Medicare Other

## 2016-03-18 DIAGNOSIS — R5081 Fever presenting with conditions classified elsewhere: Secondary | ICD-10-CM

## 2016-03-18 DIAGNOSIS — A419 Sepsis, unspecified organism: Principal | ICD-10-CM

## 2016-03-18 DIAGNOSIS — E2740 Unspecified adrenocortical insufficiency: Secondary | ICD-10-CM | POA: Diagnosis present

## 2016-03-18 DIAGNOSIS — D709 Neutropenia, unspecified: Secondary | ICD-10-CM

## 2016-03-18 LAB — CBC WITH DIFFERENTIAL/PLATELET
BASOS ABS: 0 10*3/uL (ref 0.0–0.1)
Basophils Relative: 5 %
Eosinophils Absolute: 0 10*3/uL (ref 0.0–0.7)
Eosinophils Relative: 2 %
HCT: 23 % — ABNORMAL LOW (ref 36.0–46.0)
Hemoglobin: 8.2 g/dL — ABNORMAL LOW (ref 12.0–15.0)
LYMPHS ABS: 0.2 10*3/uL — AB (ref 0.7–4.0)
Lymphocytes Relative: 34 %
MCH: 32.8 pg (ref 26.0–34.0)
MCHC: 35.7 g/dL (ref 30.0–36.0)
MCV: 92 fL (ref 78.0–100.0)
MONO ABS: 0.1 10*3/uL (ref 0.1–1.0)
Monocytes Relative: 14 %
NEUTROS ABS: 0.3 10*3/uL — AB (ref 1.7–7.7)
Neutrophils Relative %: 45 %
PLATELETS: 14 10*3/uL — AB (ref 150–400)
RBC: 2.5 MIL/uL — AB (ref 3.87–5.11)
RDW: 18.2 % — AB (ref 11.5–15.5)
WBC: 0.6 10*3/uL — AB (ref 4.0–10.5)

## 2016-03-18 LAB — TYPE AND SCREEN
ABO/RH(D): A POS
ANTIBODY SCREEN: POSITIVE
DAT, IgG: NEGATIVE
DONOR AG TYPE: NEGATIVE
Unit division: 0

## 2016-03-18 LAB — BASIC METABOLIC PANEL
Anion gap: 4 — ABNORMAL LOW (ref 5–15)
BUN: 24 mg/dL — AB (ref 6–20)
CO2: 22 mmol/L (ref 22–32)
CREATININE: 0.68 mg/dL (ref 0.44–1.00)
Calcium: 7.4 mg/dL — ABNORMAL LOW (ref 8.9–10.3)
Chloride: 108 mmol/L (ref 101–111)
GFR calc Af Amer: >60 mL/min (ref >60)
Glucose, Bld: 163 mg/dL — ABNORMAL HIGH (ref 65–99)
Potassium: 4.3 mmol/L (ref 3.5–5.1)
SODIUM: 134 mmol/L — AB (ref 135–145)

## 2016-03-18 LAB — PREPARE PLATELET PHERESIS: UNIT DIVISION: 0

## 2016-03-18 LAB — VANCOMYCIN, TROUGH: VANCOMYCIN TR: 20 ug/mL (ref 15–20)

## 2016-03-18 MED ORDER — SODIUM CHLORIDE 0.9 % IV SOLN: Freq: Once | INTRAVENOUS | Status: DC

## 2016-03-18 MED ORDER — VANCOMYCIN HCL IN DEXTROSE 750-5 MG/150ML-% IV SOLN
750.0000 mg | Freq: Two times a day (BID) | INTRAVENOUS | Status: DC
  Administered 2016-03-18 – 2016-03-22 (×8): 750 mg via INTRAVENOUS
  Filled 2016-03-18 (×8): qty 150

## 2016-03-18 NOTE — Progress Notes (Signed)
PULMONARY / CRITICAL CARE MEDICINE   Name: Shannon Rush MRN: [id number] DOB: 12/23/1940    ADMISSION DATE:  03/10/2016 CONSULTATION DATE:  03/17/2016  REFERRING MD:  Dr. Chucky May  CHIEF COMPLAINT:  Fever  SUBJECTIVE:  Cough decreased.  Pain in chest with coughing.  Still on supplemental oxygen.  No hx of asthma >> feels like BD's are helping her cough and breathing.  VITAL SIGNS: BP (!) 98/51   Pulse 95   Temp 98 F (36.7 C)   Resp (!) 24   Ht 5' 6"  (1.676 m)   Wt 124 lb 9 oz (56.5 kg)   SpO2 90%   BMI 20.10 kg/m   INTAKE / OUTPUT: I/O last 3 completed shifts: In: 5567.1 [I.V.:3867.1; Blood:300; Other:750; IV Piggyback:650] Out: 300 [Urine:300]  PHYSICAL EXAMINATION: General:  pleasant Neuro:  Alert, normal strength HEENT:  No stridor Cardiovascular:  Regular, no murmur Lungs:  B/l crackles and faint wheeze Abdomen: soft, non tender Musculoskeletal:  No edema Skin:  No rashes  LABS:  BMET  Recent Labs Lab 03/16/16 0523 03/17/16 0537 03/18/16 0403  NA 131* 131* 134*  K 3.3* 3.4* 4.3  CL 101 101 108  CO2 24 24 22   BUN 18 18 24*  CREATININE 0.66 0.68 0.68  GLUCOSE 127* 111* 163*    Electrolytes  Recent Labs Lab 03/15/16 0450 03/16/16 0523 03/17/16 0537 03/18/16 0403  CALCIUM 7.9* 7.6* 7.2* 7.4*  MG  --  1.7  --   --   PHOS 1.8* 2.0* 2.7  --     CBC  Recent Labs Lab 03/16/16 0523 03/17/16 0537 03/18/16 0403  WBC 0.3* 0.4* 0.6*  HGB 8.2* 7.3* 8.2*  HCT 23.1* 20.4* 23.0*  PLT 34* 23* 14*    Coag's No results for input(s): APTT, INR in the last 168 hours.  Sepsis Markers  Recent Labs Lab 03/11/16 1225 03/17/16 1409 03/17/16 1914  LATICACIDVEN 1.3 1.8  --   PROCALCITON  --  1.05 0.95    ABG No results for input(s): PHART, PCO2ART, PO2ART in the last 168 hours.  Liver Enzymes No results for input(s): AST, ALT, ALKPHOS, BILITOT, ALBUMIN in the last 168 hours.  Cardiac Enzymes No results for input(s): TROPONINI, PROBNP  in the last 168 hours.  Glucose  Recent Labs Lab 03/15/16 0754  GLUCAP 107*    Imaging Dg Chest 2 View  Result Date: 03/17/2016 CLINICAL DATA:  Sepsis and cough. Neutropenia and history of multiple myeloma. EXAM: CHEST  2 VIEW COMPARISON:  03/16/2016 FINDINGS: Bilateral lower lung infiltrates again noted, left greater than right. Associated small bilateral pleural effusions appears stable. Stable chronic lung disease. The heart size is normal. Stable compression deformities of the thoracic spine and left scapular lesion. Poorly healed left-sided rib fractures also again noted. IMPRESSION: Bilateral lower lobe pulmonary infiltrates, left greater than right, appear relatively stable. Associated small bilateral pleural effusions. Electronically Signed   By: Aletta Edouard M.D.   On: 03/17/2016 11:30   Dg Chest Port 1 View  Result Date: 03/18/2016 CLINICAL DATA:  Acute respiratory failure, multiple myeloma EXAM: PORTABLE CHEST 1 VIEW COMPARISON:  Multiple priors, most recently 03/17/2016 FINDINGS: Upper lobe predominant scarring, chronic. Possible mild perihilar edema. Small bilateral pleural effusions, mildly increased. Associated patchy right lower lobe opacity, atelectasis versus pneumonia. Suspected left lower lobe opacity, although obscured by overlying soft tissues. Cardiomegaly. Degenerative changes of the thoracic spine. IMPRESSION: Cardiomegaly with possible mild perihilar edema and increasing small bilateral pleural effusions. Associated patchy  right lower lobe opacity, atelectasis versus pneumonia. Electronically Signed   By: Julian Hy M.D.   On: 03/18/2016 07:32     STUDIES:   CULTURES: 8/28 Sputum >> negative 8/28 Urine >> negative 8/28 Blood >> negative 9/02 Blood >>   ANTIBIOTICS: 8/27 Vancomycin >> 8/27 Cefepime >> 8/27 Acyclovir >>   SIGNIFICANT EVENTS: 8/27 Admit, oncology consulted 9/03 to ICU with low blood pressure  LINES/TUBES:  DISCUSSION: 75 yo  female presented with fever (Tm 102.8), production cough, pleurisy.  She has hx of relapsed multiple myeloma on chemotherapy (pomalidomide, dexamethasone, daratumumab).  She was found to have neutropenia and concern for pneumonia with hypoxia.  Transferred to ICU 9/03 with concern for sepsis with low blood pressure.  PMHx of CAD, HLD, Osteoporosis.  ASSESSMENT / PLAN:  PULMONARY A: Acute hypoxic respiratory failure, wheezing 2nd to HCAP. P:   Oxygen to keep SpO2 > 92% F/u CXR Continue BDs  CARDIOVASCULAR A:  Sepsis. Hx of CAD, HLD. P:  Continue IV fluids Continue lipitor  RENAL A:   No acute issues. P:   Monitor renal fx, urine outpt  GASTROINTESTINAL A:   Protein calorie malnutrition. P:   Regular diet  HEMATOLOGIC A:   Pancytopenia 2nd to chemotherapy for MM. P:  F/u CBC Neupogen per oncology  INFECTIOUS A:   Febrile neutropenia with HCAP. P:   Continue vancomycin, cefepime, acyclovir until [place] improved  ENDOCRINE A:   Relative adrenal insufficiency >> gets decadron for chemotherapy. P:   Continue solu cortef  NEUROLOGIC A:   Cancer pain. P:   Prn morphine  CC time 32 minutes.  Chesley Mires, MD [facility] Pulmonary/Critical Care 03/18/2016, 10:42 AM Pager:  [phone] After 3pm call: [phone]

## 2016-03-18 NOTE — Progress Notes (Signed)
PHARMACIST - PHYSICIAN COMMUNICATION CONCERNING:  Vancomycin  74 yoF on IV Vancomycin and Cefepime for febrile neutropenia.    Currently on Vancomycin 1g IV q12h.  VT tonight = 20 mcg/ml - upper end of therapeutic range. Renal function has remained stable.   RECOMMENDATION: Reduce to Vancomycin 750mg  IV q12h to avoid further accumulation.    Ralene Bathe, PharmD, BCPS 03/18/2016, 9:34 PM  Pager: [phone]

## 2016-03-18 NOTE — Progress Notes (Signed)
HEMATOLOGY-ONCOLOGY PROGRESS NOTE  SUBJECTIVE:Hypotension with pancytopenia, transfer to the intensive care unit, continuing to be frail and short of breath. Improvement in blood pressure with steroids. She did require pressors briefly. Continues to be pancytopenic.  OBJECTIVE: REVIEW OF SYSTEMS:   Frail appearing lady in moderate distress She was sleeping when I saw her.  PHYSICAL EXAMINATION: ECOG PERFORMANCE STATUS: 3 - Symptomatic, >50% confined to bed  Vitals:   03/18/16 0600 03/18/16 0814  BP: (!) 90/52 (!) 103/58  Pulse: 83 86  Resp:  17  Temp:  97.8 F (36.6 C)   Filed Weights   03/10/16 1447  Weight: 124 lb 9 oz (56.5 kg)    GENERAL:Open mouth breathing OROPHARYNX: Dry mouth  LUNGS: Wheezes have improved HEART: regular rate & rhythm and no murmurs and no lower extremity edema ABDOMEN:abdomen soft, non-tender and normal bowel sounds  LABORATORY DATA:  I have reviewed the data as listed CMP Latest Ref Rng & Units 03/18/2016 03/17/2016 03/16/2016  Glucose 65 - 99 mg/dL 163(H) 111(H) 127(H)  BUN 6 - 20 mg/dL 24(H) 18 18  Creatinine 0.44 - 1.00 mg/dL 0.68 0.68 0.66  Sodium 135 - 145 mmol/L 134(L) 131(L) 131(L)  Potassium 3.5 - 5.1 mmol/L 4.3 3.4(L) 3.3(L)  Chloride 101 - 111 mmol/L 108 101 101  CO2 22 - 32 mmol/L 22 24 24   Calcium 8.9 - 10.3 mg/dL 7.4(L) 7.2(L) 7.6(L)  Total Protein 6.5 - 8.1 g/dL - - -  Total Bilirubin 0.3 - 1.2 mg/dL - - -  Alkaline Phos 38 - 126 U/L - - -  AST 15 - 41 U/L - - -  ALT 14 - 54 U/L - - -    Lab Results  Component Value Date   WBC 0.6 (LL) 03/18/2016   HGB 8.2 (L) 03/18/2016   HCT 23.0 (L) 03/18/2016   MCV 92.0 03/18/2016   PLT 14 (LL) 03/18/2016   NEUTROABS 0.3 (L) 03/18/2016    ASSESSMENT AND PLAN: 1. Neutropenic fever/sepsis 2. I changed her growth factor to Neupogen from Granix. 3. Slight improvement in the white blood cell count although still extremely low. 4. Continue IV antibiotics 5. Patient will be getting  platelet transfusion today. 6. Severe anemia: Blood transfusion was given yesterday. Continue current supportive care. Dr. Burr Medico will see the patient from tomorrow.

## 2016-03-18 NOTE — Progress Notes (Signed)
Pharmacy Antibiotic Note  Shannon Rush is a 75 y.o. female with PMH of multiple myeloma admitted on 03/10/2016 with HCAP.  She continues on day#10 Cefepime.  Vancomycin was restarted on 9/2 per pharmacy dosing due to repeat sepsis workup.  Today, 03/18/2016  D10 Cefepime 2g q8h D3 resumed Vanc 1g IV q12h Afebrile WBC low, 0.6; ANC 0.3 on Neupogen 480mcg SQ q24h SCr 0.68 with CrCl ~ 55ml/min  Plan:  Continue Cefepime to 2g IV q8h due to fever, neutropenia.   ContinueVancomycin 1gm IV q12h  Check Vancomycin trough tonight at  steady state  Monitor renal function and cx data  Height: 5' 6" (167.6 cm) Weight: 124 lb 9 oz (56.5 kg) IBW/kg (Calculated) : 59.3  Temp (24hrs), Avg:97.9 F (36.6 C), Min:97.4 F (36.3 C), Max:98.3 F (36.8 C)   Recent Labs Lab 03/11/16 1225  03/12/16 1142  03/14/16 0526 03/15/16 0450 03/16/16 0523 03/17/16 0537 03/17/16 1409 03/18/16 0403  WBC  --   < >  --   < > 0.3* 0.3* 0.3* 0.4*  --  0.6*  CREATININE  --   < >  --   < > 0.70 0.75 0.66 0.68  --  0.68  LATICACIDVEN 1.3  --   --   --   --   --   --   --  1.8  --   VANCOTROUGH  --   --  10*  --   --   --   --   --   --   --   < > = values in this interval not displayed.  Estimated Creatinine Clearance: 55.9 mL/min (by C-G formula based on SCr of 0.8 mg/dL).    Allergies  Allergen Reactions  . Zosyn [Piperacillin Sod-Tazobactam So] Rash    Itching, rash and swelling of extremities  . Piperacillin-Tazobactam In Dex Rash  . Zithromax [Azithromycin]     Full body rash  . Ciprofloxacin Rash    Throwing up and stomach pain     Antimicrobials this admission: PTA Zovirax cont'd 8/27 >> Vancomycin >> 8/31; 9/2>> 8/27 >> Cefepime >>   Dose adjustments this admission: Prev adm: 09/17/14 VT = 10.4 on 500mg q12h, inc to 750mg q12h Current adm: 8/29 1142 VT: 10 on 750mg q12h, inc to 1g q12h   Microbiology results: 8/27 BCx: NGF 8/27 UCx: NGF 8/28 Sputum: Normal flora-final 9/2 BCx:  ngtd   Thank you for allowing pharmacy to be a part of this patient's care.   , PharmD, BCPS Pager: [phone] 03/12/2016 12:50 PM   

## 2016-03-18 NOTE — Progress Notes (Signed)
PROGRESS NOTE                                                                                                                                                                                                             Patient Demographics:    Shannon Rush, is a 75 y.o. female, DOB - [birth year], LXB:[id number]  Admit date - 03/10/2016   Admitting Physician Costin Karlyne Greenspan, MD  Outpatient Primary MD for the patient is [name], MD  LOS - 8  Outpatient Specialists: Dr Burr Medico  Chief Complaint  Patient presents with  . Shortness of Breath    Immunocompromise       Brief Narrative   75 year old female with history of multiple myeloma on chemotherapy (pomalidomide, dexamethasone, daratumumab), CAD, hyperlipidemia, osteoporosis, admitted with sepsis secondary to pneumonia, as well with neutropenia secondary to chemotherapy, treated with vancomycin and cefepime, remain septic, hypotensive, transferred to stepdown on 9/3, and with persistent neutropenia despite being one week on IV Granix   Subjective:   Patient reports she is feeling Weak today, afebrile over the last 24 hours, still complaining of cough.   Assessment  & Plan :    Sepsis ([place]) - Secondary to healthcare associated pneumonia with associated hypoxic respiratory failure. -  On empiric vancomycin and cefepime, stopped vancomycin 8/31, resumed on 9/2 as she became febrile again. - Culture so far negative. - Maintenance hypotensive, so transferred to stepdown on 9/3, which evidently no need for pressors so far. - With relative adrenal insufficiency giving Decadron on chemotherapy, continue with stress dose steroids. - Repeat septic workup with no growth to date.  HCAP - Patient treated with vancomycin and cefepime, without significant improvement this is most likely due to her immunocompromise status with neutropenia - Sputum culture growing normal  respiratory flora, blood cultures with no growth to date - Would consider CT chest without contrast if no improvement   Acute hypoxic respiratory failure - Secondary to HCAP and mild fluid overload. - Wheezing improved after IV steroids and nebs  Pancytopenia (HCC)/neutropenia - Neutropenia:Secondary to recent chemotherapy. Management per oncology, on IV Granix since admission, changed 9/3 to Neupogen ,Maintain neutropenic precautions, very poor response despite multiple days on IV granix, minimal improvement of neutrophil count today, but remains extremely low , C-16 and folic acid within normal limits - Thrombocytopenia: Received 1 unit  platelet 9/1, will receive another unit today and given platelet count of 14 K - Anemia, received 1 unit PRBC 03/3039 mg 7.3, monitor closely and transfuse as needed.  Multiple myeloma ([place]) - Follows with Dr Burr Medico  Coronary atherosclerosis of native coronary artery - Continue Toprol. hold Plavix given thrombocytopenia  Protein-calorie malnutrition ([place]) - Continue with ensure  Hypokalemia/hypophosphatemia Replenished      Code Status : Full code  Family Communication  : None at bedside  Disposition Plan  : Pending further workup  Consults  : Dr Burr Medico, PCCM  Procedures  : None  DVT Prophylaxis  :  SCDs  Lab Results  Component Value Date   PLT 14 (LL) 03/18/2016    Antibiotics  :    Anti-infectives    Start     Dose/Rate Route Frequency Ordered Stop   03/16/16 0900  vancomycin (VANCOCIN) IVPB 1000 mg/200 mL premix     1,000 mg 200 mL/hr over 60 Minutes Intravenous Every 12 hours 03/16/16 0802     03/12/16 2200  vancomycin (VANCOCIN) IVPB 1000 mg/200 mL premix  Status:  Discontinued     1,000 mg 200 mL/hr over 60 Minutes Intravenous Every 12 hours 03/12/16 1246 03/14/16 0937   03/12/16 1245  vancomycin (VANCOCIN) IVPB 1000 mg/200 mL premix     1,000 mg 200 mL/hr over 60 Minutes Intravenous STAT 03/12/16 1233 03/12/16 1348    03/12/16 1200  ceFEPIme (MAXIPIME) 2 g in dextrose 5 % 50 mL IVPB     2 g 100 mL/hr over 30 Minutes Intravenous Every 8 hours 03/12/16 0843     03/11/16 0000  vancomycin (VANCOCIN) IVPB 750 mg/150 ml premix  Status:  Discontinued     750 mg 150 mL/hr over 60 Minutes Intravenous Every 12 hours 03/10/16 1205 03/12/16 1233   03/10/16 2200  acyclovir (ZOVIRAX) tablet 400 mg     400 mg Oral 2 times daily 03/10/16 1511     03/10/16 2000  ceFEPIme (MAXIPIME) 1 g in dextrose 5 % 50 mL IVPB  Status:  Discontinued     1 g 100 mL/hr over 30 Minutes Intravenous Every 8 hours 03/10/16 1307 03/12/16 0843   03/10/16 1400  aztreonam (AZACTAM) 2 g in dextrose 5 % 50 mL IVPB  Status:  Discontinued     2 g 100 mL/hr over 30 Minutes Intravenous Every 8 hours 03/10/16 1341 03/10/16 1344   03/10/16 1115  ceFEPIme (MAXIPIME) 2 g in dextrose 5 % 50 mL IVPB     2 g 100 mL/hr over 30 Minutes Intravenous STAT 03/10/16 1100 03/10/16 1306   03/10/16 1100  vancomycin (VANCOCIN) IVPB 750 mg/150 ml premix     750 mg 150 mL/hr over 60 Minutes Intravenous STAT 03/10/16 1057 03/10/16 1406   03/10/16 1045  aztreonam (AZACTAM) 2 g in dextrose 5 % 50 mL IVPB  Status:  Discontinued     2 g 100 mL/hr over 30 Minutes Intravenous  Once 03/10/16 1042 03/10/16 1100        Objective:   Vitals:   03/18/16 0908 03/18/16 0923 03/18/16 1044 03/18/16 1045  BP: (!) 95/59 (!) 98/51 (!) 102/58 (!) 102/58  Pulse: 95 95 90 92  Resp: (!) 30 (!) 24 (!) 26 (!) 24  Temp: 98.3 F (36.8 C) 98 F (36.7 C) 98 F (36.7 C)   TempSrc: Oral  Oral   SpO2: 91% 90% 91% 91%  Weight:      Height:  Wt Readings from Last 3 Encounters:  03/10/16 56.5 kg (124 lb 9 oz)  03/07/16 56.5 kg (124 lb 9.6 oz)  02/15/16 55.6 kg (122 lb 9.6 oz)     Intake/Output Summary (Last 24 hours) at 03/18/16 1055 Last data filed at 03/18/16 1044  Gross per 24 hour  Intake          3961.17 ml  Output              950 ml  Net          3011.17 ml      Physical Exam  Gen: not in distress, frail HEENT: no pallor, Moist mucosa supple neck Chest: Fine bibasilar crackles, good air entry CVS: N S1&S2, no murmurs, rubs or gallop GI: soft, NT, ND, BS+ Musculoskeletal: warm, mild pitting edema CNS: Alert and oriented    Data Review:    CBC  Recent Labs Lab 03/13/16 0548 03/14/16 0526 03/15/16 0450 03/16/16 0523 03/17/16 0537 03/18/16 0403  WBC 0.6* 0.3* 0.3* 0.3* 0.4* 0.6*  HGB 8.4* 8.6* 8.6* 8.2* 7.3* 8.2*  HCT 23.7* 24.1* 24.2* 23.1* 20.4* 23.0*  PLT 26* 23* 18* 34* 23* 14*  MCV 97.5 96.0 98.8 98.7 97.1 92.0  MCH 34.6* 34.3* 35.1* 35.0* 34.8* 32.8  MCHC 35.4 35.7 35.5 35.5 35.8 35.7  RDW 15.2 15.2 15.3 15.4 15.3 18.2*  LYMPHSABS 0.1*  --  0.2* 0.1* 0.2* 0.2*  MONOABS 0.0*  --  0.0* 0.0* 0.1 0.1  EOSABS 0.1  --  0.1 0.0 0.0 0.0  BASOSABS 0.0  --  0.0 0.0 0.0 0.0    Chemistries   Recent Labs Lab 03/14/16 0526 03/15/16 0450 03/16/16 0523 03/17/16 0537 03/18/16 0403  NA 133* 131* 131* 131* 134*  K 3.4* 3.7 3.3* 3.4* 4.3  CL 104 104 101 101 108  CO2 23 24 24 24 22   GLUCOSE 107* 110* 127* 111* 163*  BUN 14 17 18 18  24*  CREATININE 0.70 0.75 0.66 0.68 0.68  CALCIUM 7.7* 7.9* 7.6* 7.2* 7.4*  MG  --   --  1.7  --   --    ------------------------------------------------------------------------------------------------------------------ No results for input(s): CHOL, HDL, LDLCALC, TRIG, CHOLHDL, LDLDIRECT in the last 72 hours.  No results found for: HGBA1C ------------------------------------------------------------------------------------------------------------------ No results for input(s): TSH, T4TOTAL, T3FREE, THYROIDAB in the last 72 hours.  Invalid input(s): FREET3 ------------------------------------------------------------------------------------------------------------------  Recent Labs  03/16/16 1115  VITAMINB12 736  FOLATE 12.8    Coagulation profile No results for input(s): INR, PROTIME  in the last 168 hours.  No results for input(s): DDIMER in the last 72 hours.  Cardiac Enzymes No results for input(s): CKMB, TROPONINI, MYOGLOBIN in the last 168 hours.  Invalid input(s): CK ------------------------------------------------------------------------------------------------------------------ No results found for: BNP  Inpatient Medications  Scheduled Meds: . acyclovir  400 mg Oral BID  . atorvastatin  40 mg Oral Daily  . calcium carbonate  1,250 mg Oral Daily  . ceFEPime (MAXIPIME) IV  2 g Intravenous Q8H  . feeding supplement (ENSURE ENLIVE)  237 mL Oral TID BM  . filgrastim  480 mcg Subcutaneous Q24H  . fluticasone  2 spray Each Nare Daily  . guaiFENesin  600 mg Oral BID  . hydrocortisone sod succinate (SOLU-CORTEF) inj  100 mg Intravenous Q8H  . ipratropium-albuterol  3 mL Nebulization BID  . phosphorus  250 mg Oral Daily  . potassium chloride  20 mEq Oral Daily  . vancomycin  1,000 mg Intravenous Q12H   Continuous Infusions: . sodium  chloride 75 mL/hr at 03/18/16 0600  . phenylephrine (NEO-SYNEPHRINE) Adult infusion     PRN Meds:.acetaminophen, guaiFENesin-dextromethorphan, ipratropium-albuterol, morphine, ondansetron, prochlorperazine, zolpidem  Micro Results Recent Results (from the past 240 hour(s))  Urine culture     Status: None   Collection Time: 03/10/16  9:54 AM  Result Value Ref Range Status   Specimen Description URINE, RANDOM  Final   Special Requests NONE  Final   Culture NO GROWTH Performed at [facility]   Final   Report Status 03/11/2016 FINAL  Final  Culture, blood (routine x 2)     Status: None   Collection Time: 03/10/16  9:12 PM  Result Value Ref Range Status   Specimen Description BLOOD LEFT ARM  Final   Special Requests BOTTLES DRAWN AEROBIC AND ANAEROBIC 5CC  Final   Culture   Final    NO GROWTH 5 DAYS Performed at [facility]    Report Status 03/16/2016 FINAL  Final  Culture, blood (routine x 2)      Status: None   Collection Time: 03/10/16  9:18 PM  Result Value Ref Range Status   Specimen Description BLOOD LEFT ARM  Final   Special Requests BOTTLES DRAWN AEROBIC AND ANAEROBIC 5CC  Final   Culture   Final    NO GROWTH 5 DAYS Performed at [facility]    Report Status 03/16/2016 FINAL  Final  Culture, sputum-assessment     Status: None   Collection Time: 03/11/16  4:24 PM  Result Value Ref Range Status   Specimen Description SPUTUM  Final   Special Requests NONE  Final   Sputum evaluation   Final    THIS SPECIMEN IS ACCEPTABLE. RESPIRATORY CULTURE REPORT TO FOLLOW.   Report Status 03/11/2016 FINAL  Final  Culture, respiratory (NON-Expectorated)     Status: None   Collection Time: 03/11/16  4:24 PM  Result Value Ref Range Status   Specimen Description SPUTUM  Final   Special Requests NONE  Final   Gram Stain   Final    ABUNDANT WBC PRESENT,BOTH PMN AND MONONUCLEAR FEW HYPHAE RARE GRAM NEGATIVE RODS RARE GRAM POSITIVE RODS FEW GRAM POSITIVE COCCI    Culture   Final    Consistent with normal respiratory flora. Performed at [facility]    Report Status 03/13/2016 FINAL  Final  Culture, blood (Routine X 2) w Reflex to ID Panel     Status: None (Preliminary result)   Collection Time: 03/16/16  9:40 AM  Result Value Ref Range Status   Specimen Description BLOOD LEFT ANTECUBITAL  Final   Special Requests IN PEDIATRIC BOTTLE 2CC  Final   Culture   Final    NO GROWTH 1 DAY Performed at [facility]    Report Status PENDING  Incomplete  Culture, blood (Routine X 2) w Reflex to ID Panel     Status: None (Preliminary result)   Collection Time: 03/16/16  9:44 AM  Result Value Ref Range Status   Specimen Description BLOOD RIGHT HAND  Final   Special Requests IN PEDIATRIC BOTTLE .5CC  Final   Culture   Final    NO GROWTH 1 DAY Performed at [facility]    Report Status PENDING  Incomplete    Radiology Reports Dg Chest 2 View  Result Date:  03/17/2016 CLINICAL DATA:  Sepsis and cough. Neutropenia and history of multiple myeloma. EXAM: CHEST  2 VIEW COMPARISON:  03/16/2016 FINDINGS: Bilateral lower lung infiltrates again noted,  left greater than right. Associated small bilateral pleural effusions appears stable. Stable chronic lung disease. The heart size is normal. Stable compression deformities of the thoracic spine and left scapular lesion. Poorly healed left-sided rib fractures also again noted. IMPRESSION: Bilateral lower lobe pulmonary infiltrates, left greater than right, appear relatively stable. Associated small bilateral pleural effusions. Electronically Signed   By: Aletta Edouard M.D.   On: 03/17/2016 11:30   Dg Chest 2 View  Result Date: 03/16/2016 CLINICAL DATA:  Fever.  History of multiple myeloma. EXAM: CHEST  2 VIEW COMPARISON:  03/03/2016. FINDINGS: Underlying chronic lung disease with emphysema and pulmonary scarring. Superimposed bilateral infiltrates and small effusions. Numerous bone lesions are again demonstrated consistent with known multiple myeloma. IMPRESSION: Chronic underlying lung disease with superimposed infiltrates and effusions. Multiple bone lesions consistent with known multiple myeloma. Electronically Signed   By: Marijo Sanes M.D.   On: 03/16/2016 08:49   Dg Chest 2 View  Result Date: 03/10/2016 CLINICAL DATA:  Cough and shortness of breath.  Chest pain EXAM: CHEST  2 VIEW COMPARISON:  11/02/2014 FINDINGS: Normal heart size. Advanced chronic interstitial lung changes are again identified. Superimposed bibasilar opacities are identified. There are small pleural effusions present. Chronic multi level compression deformities are identified within the thoracic spine. IMPRESSION: 1. Bibasilar opacities which may represent atelectasis and/or pneumonia. 2. Small bilateral pleural effusions. Electronically Signed   By: Kerby Moors M.D.   On: 03/10/2016 10:25   Dg Chest Port 1 View  Result Date:  03/18/2016 CLINICAL DATA:  Acute respiratory failure, multiple myeloma EXAM: PORTABLE CHEST 1 VIEW COMPARISON:  Multiple priors, most recently 03/17/2016 FINDINGS: Upper lobe predominant scarring, chronic. Possible mild perihilar edema. Small bilateral pleural effusions, mildly increased. Associated patchy right lower lobe opacity, atelectasis versus pneumonia. Suspected left lower lobe opacity, although obscured by overlying soft tissues. Cardiomegaly. Degenerative changes of the thoracic spine. IMPRESSION: Cardiomegaly with possible mild perihilar edema and increasing small bilateral pleural effusions. Associated patchy right lower lobe opacity, atelectasis versus pneumonia. Electronically Signed   By: Julian Hy M.D.   On: 03/18/2016 07:32   Dg Chest Port 1 View  Result Date: 03/11/2016 CLINICAL DATA:  Productive cough and chest congestion for the past 2 weeks. Fever and hypoxia. Shortness of breath. Multiple myeloma. EXAM: PORTABLE CHEST 1 VIEW COMPARISON:  Yesterday. FINDINGS: Increased patchy density at both lung bases. Small bilateral pleural effusions. Diffuse osteopenia. Old, healed left clavicle fracture and old, healed left rib fractures. Previously described chronic thoracic vertebral compression deformities. IMPRESSION: Increased probable pneumonia at both lung bases with increased bilateral pleural fluid. Electronically Signed   By: Claudie Revering M.D.   On: 03/11/2016 18:18     Feige Lowdermilk M.D on 03/18/2016 at 10:55 AM  Between 7am to 7pm - Pager - [phone]  After 7pm go to [email or web address] - password [facility]  [facility] -  Office  [phone]

## 2016-03-19 ENCOUNTER — Encounter (HOSPITAL_COMMUNITY): Payer: Self-pay | Admitting: Pulmonary Disease

## 2016-03-19 ENCOUNTER — Inpatient Hospital Stay (HOSPITAL_COMMUNITY): Payer: Medicare Other

## 2016-03-19 DIAGNOSIS — D61810 Antineoplastic chemotherapy induced pancytopenia: Secondary | ICD-10-CM

## 2016-03-19 LAB — CBC WITH DIFFERENTIAL/PLATELET
BASOS ABS: 0 10*3/uL (ref 0.0–0.1)
Basophils Relative: 2 %
EOS ABS: 0 10*3/uL (ref 0.0–0.7)
Eosinophils Relative: 1 %
HCT: 23.7 % — ABNORMAL LOW (ref 36.0–46.0)
Hemoglobin: 8.4 g/dL — ABNORMAL LOW (ref 12.0–15.0)
LYMPHS ABS: 0.4 10*3/uL — AB (ref 0.7–4.0)
Lymphocytes Relative: 34 %
MCH: 32.8 pg (ref 26.0–34.0)
MCHC: 35.4 g/dL (ref 30.0–36.0)
MCV: 92.6 fL (ref 78.0–100.0)
MONO ABS: 0.1 10*3/uL (ref 0.1–1.0)
Monocytes Relative: 7 %
Neutro Abs: 0.6 10*3/uL — ABNORMAL LOW (ref 1.7–7.7)
Neutrophils Relative %: 56 %
Platelets: 20 10*3/uL — CL (ref 150–400)
RBC: 2.56 MIL/uL — AB (ref 3.87–5.11)
RDW: 17.8 % — AB (ref 11.5–15.5)
WBC: 1.1 10*3/uL — AB (ref 4.0–10.5)

## 2016-03-19 LAB — BASIC METABOLIC PANEL
Anion gap: 5 (ref 5–15)
BUN: 29 mg/dL — AB (ref 6–20)
CALCIUM: 7.5 mg/dL — AB (ref 8.9–10.3)
CO2: 20 mmol/L — ABNORMAL LOW (ref 22–32)
CREATININE: 0.72 mg/dL (ref 0.44–1.00)
Chloride: 109 mmol/L (ref 101–111)
GFR calc Af Amer: >60 mL/min (ref >60)
Glucose, Bld: 139 mg/dL — ABNORMAL HIGH (ref 65–99)
POTASSIUM: 4.2 mmol/L (ref 3.5–5.1)
SODIUM: 134 mmol/L — AB (ref 135–145)

## 2016-03-19 LAB — PREPARE PLATELET PHERESIS: Unit division: 0

## 2016-03-19 LAB — MRSA PCR SCREENING: MRSA BY PCR: NEGATIVE

## 2016-03-19 LAB — PROCALCITONIN: PROCALCITONIN: 1.23 ng/mL

## 2016-03-19 LAB — ANTINUCLEAR ANTIBODIES, IFA: ANTINUCLEAR ANTIBODIES, IFA: NEGATIVE

## 2016-03-19 MED ORDER — PRO-STAT SUGAR FREE PO LIQD
30.0000 mL | Freq: Two times a day (BID) | ORAL | Status: DC
  Administered 2016-03-19 – 2016-03-20 (×2): 30 mL via ORAL
  Filled 2016-03-19 (×4): qty 30

## 2016-03-19 MED ORDER — GUAIFENESIN ER 600 MG PO TB12
1200.0000 mg | ORAL_TABLET | Freq: Two times a day (BID) | ORAL | Status: DC
  Administered 2016-03-19 – 2016-03-29 (×21): 1200 mg via ORAL
  Filled 2016-03-19 (×21): qty 2

## 2016-03-19 MED ORDER — SODIUM CHLORIDE 3 % IN NEBU
4.0000 mL | INHALATION_SOLUTION | Freq: Two times a day (BID) | RESPIRATORY_TRACT | Status: AC
  Administered 2016-03-19 (×2): 4 mL via RESPIRATORY_TRACT
  Filled 2016-03-19 (×2): qty 4

## 2016-03-19 NOTE — Progress Notes (Signed)
PULMONARY / CRITICAL CARE MEDICINE   Name: Shannon Rush MRN: [id number] DOB: 01/13/1941    ADMISSION DATE:  03/10/2016 CONSULTATION DATE:  03/17/2016  REFERRING MD:  Dr. Chucky May  CHIEF COMPLAINT:  Fever  SUBJECTIVE:  Still has the cough, some chest congestion, didn't rest well last night, dyspnea a bit better  VITAL SIGNS: BP 107/65   Pulse 89   Temp 98.2 F (36.8 C) (Oral)   Resp (!) 23   Ht 5' 6"  (1.676 m)   Wt 124 lb 9 oz (56.5 kg)   SpO2 93%   BMI 20.10 kg/m   INTAKE / OUTPUT: I/O last 3 completed shifts: In: 4127.4 [P.O.:360; I.V.:2775.4; Blood:192; IV Piggyback:800] Out: 8185 [Urine:1575]  PHYSICAL EXAMINATION: General:  No distress in bed Neuro:  Awake, alert, no distress HEENT:  NCAT, OP clear Cardiovascular:  RRR, no mgr Lungs:  No wheezing today, rhonchi noted left lung, good air movement Abdomen: soft, no masses Musculoskeletal:  Normal bulk, no deformities Skin:  No rash  LABS:  BMET  Recent Labs Lab 03/17/16 0537 03/18/16 0403 03/19/16 0350  NA 131* 134* 134*  K 3.4* 4.3 4.2  CL 101 108 109  CO2 24 22 20*  BUN 18 24* 29*  CREATININE 0.68 0.68 0.72  GLUCOSE 111* 163* 139*    Electrolytes  Recent Labs Lab 03/15/16 0450 03/16/16 0523 03/17/16 0537 03/18/16 0403 03/19/16 0350  CALCIUM 7.9* 7.6* 7.2* 7.4* 7.5*  MG  --  1.7  --   --   --   PHOS 1.8* 2.0* 2.7  --   --     CBC  Recent Labs Lab 03/17/16 0537 03/18/16 0403 03/19/16 0350  WBC 0.4* 0.6* 1.1*  HGB 7.3* 8.2* 8.4*  HCT 20.4* 23.0* 23.7*  PLT 23* 14* 20*    Coag's No results for input(s): APTT, INR in the last 168 hours.  Sepsis Markers  Recent Labs Lab 03/17/16 1409 03/17/16 1914 03/19/16 0350  LATICACIDVEN 1.8  --   --   PROCALCITON 1.05 0.95 1.23    ABG No results for input(s): PHART, PCO2ART, PO2ART in the last 168 hours.  Liver Enzymes No results for input(s): AST, ALT, ALKPHOS, BILITOT, ALBUMIN in the last 168 hours.  Cardiac Enzymes No  results for input(s): TROPONINI, PROBNP in the last 168 hours.  Glucose  Recent Labs Lab 03/15/16 0754  GLUCAP 107*    Imaging Dg Chest Port 1 View  Result Date: 03/19/2016 CLINICAL DATA:  Shortness of breath/respiratory failure EXAM: PORTABLE CHEST 1 VIEW COMPARISON:  March 18, 2016 FINDINGS: Interstitial edema is stable. There are pleural effusions bilaterally with apparent loculated effusion at the right base. There is a degree of bibasilar atelectasis. A degree of superimposed pneumonia in the left base is questioned. Heart is mildly enlarged with pulmonary venous hypertension. No adenopathy is evident. Bones are osteoporotic. There are old fractures involving multiple ribs on the left. The inferior left scapula, and mid left clavicle. IMPRESSION: Congestive heart failure. Suspect superimposed pneumonia left lower lobe. Multiple prior fractures. Bones osteoporotic. Electronically Signed   By: Lowella Grip III M.D.   On: 03/19/2016 07:07     STUDIES:   CULTURES: 8/28 Sputum >> negative 8/28 Urine >> negative 8/28 Blood >> negative 9/02 Blood >> negative  ANTIBIOTICS: 8/27 Vancomycin >> 8/27 Cefepime >> 8/27 Acyclovir >>   SIGNIFICANT EVENTS: 8/27 Admit, oncology consulted 9/03 to ICU with low blood pressure  LINES/TUBES:  CXR over weekend personally reviewed> bilateral airspace disease,  kyphosis, small effusions  DISCUSSION: 75 yo female with HCAP in setting of relapsed multiple myeloma on treatment with pomalidomide, dexamethasone, and daratumumab.  Slow improvement.  PMHx of CAD, HLD, Osteoporosis.  ASSESSMENT / PLAN:  PULMONARY A: Acute hypoxic respiratory failure HCAP P:   Oxygen to keep SpO2 > 92% F/u CXR Continue BDs Add hypertonic saline Out of bed Increase mucinex Continue antibiotics Encouraged flutter valve use  CARDIOVASCULAR A:  Sepsis > improved Hx of CAD, HLD P:  KVO IV fluids Continue lipitor  INFECTIOUS A:   Febrile  neutropenia with HCAP P:   Continue vancomycin, cefepime, acyclovir until [place] improved  ENDOCRINE A:   Relative adrenal insufficiency >> gets decadron for chemotherapy P:   Continue solu cortef  Roselie Awkward, MD [place] [facility] Pager: [phone] Cell: [phone] After 3pm or if no response, call [phone]

## 2016-03-19 NOTE — Progress Notes (Signed)
Nutrition Brief Note  Patient identified for LOS (9 days).  Wt Readings from Last 15 Encounters:  03/10/16 124 lb 9 oz (56.5 kg)  03/07/16 124 lb 9.6 oz (56.5 kg)  02/15/16 122 lb 9.6 oz (55.6 kg)  02/07/16 124 lb 6.4 oz (56.4 kg)  01/09/16 123 lb 12.8 oz (56.2 kg)  12/07/15 127 lb 8 oz (57.8 kg)  11/03/15 127 lb 12.8 oz (58 kg)  10/26/15 129 lb 9.6 oz (58.8 kg)  09/14/15 130 lb 8 oz (59.2 kg)  07/20/15 129 lb 12.8 oz (58.9 kg)  06/01/15 126 lb 6.4 oz (57.3 kg)  04/20/15 127 lb (57.6 kg)  03/13/15 125 lb 12.8 oz (57.1 kg)  02/17/15 124 lb 12.8 oz (56.6 kg)  01/30/15 130 lb (59 kg)    Body mass index is 20.1 kg/m. Patient meets criteria for normal weight based on current BMI. Skin WDL.   Pt with hx of multiple myeloma with recent relapse and started on a new chemo treatment. Per Dr. Ernestina Penna note, treatment regimen: pomalidomide, dexa and daratumumab.   Current diet order is Regular, patient is consuming approximately 75% of meals at this time. Labs and medications reviewed.   Pt is currently ordered Ensure Enlive TID but has not accepted supplement since order placed 8/31. Will d/c Ensure Enlive at this time d/t pt preference. Continue to encourage PO intakes of meals. No further nutrition interventions warranted at this time. If nutrition issues arise, please consult RD.     Shannon Matin, MS, RD, LDN Inpatient Clinical Dietitian Pager # [phone] After hours/weekend pager # [phone]

## 2016-03-19 NOTE — Progress Notes (Signed)
PROGRESS NOTE                                                                                                                                                                                                             Patient Demographics:    Shannon Rush, is a 74 y.o. female, DOB - [birth year], ZOX:[id number]  Admit date - 03/10/2016   Admitting Physician Costin Karlyne Greenspan, MD  Outpatient Primary MD for the patient is [name], MD  LOS - 9  Outpatient Specialists: Dr Burr Medico  Chief Complaint  Patient presents with  . Shortness of Breath    Immunocompromise       Brief Narrative   75 year old female with history of multiple myeloma on chemotherapy (pomalidomide, dexamethasone, daratumumab), CAD, hyperlipidemia, osteoporosis, admitted with sepsis secondary to pneumonia, as well with neutropenia secondary to chemotherapy, treated with vancomycin and cefepime, remain septic, hypotensive, transferred to stepdown on 9/3, Blood pressure improved on stress dose steroids , as well slow to respond to IV Granix.   Subjective:   Patient reports she is feeling Better today, afebrile, reports cough, nonproductive, blood pressure stable overnight   Assessment  & Plan :    Sepsis ([place]) - Secondary to healthcare associated pneumonia with associated hypoxic respiratory failure. - On empiric vancomycin and cefepime, stopped vancomycin 8/31, resumed on 9/2 as she became febrile again. - Culture so far negative. - Became hypotensive, so transferred to stepdown on 9/3, so far did not need any pressors. - With relative adrenal insufficiency givien Decadron on chemotherapy, blood pressure is significantly improved after initiation of stress dose steroids. - Repeat septic workup with no growth to date.  HCAP - Patient treated with vancomycin and cefepime, without significant improvement this is most likely due to her  immunocompromise status with neutropenia - Sputum culture growing normal respiratory flora, blood cultures with no growth to date  Acute hypoxic respiratory failure - Secondary to HCAP and mild fluid overload. - Wheezing improved after IV steroids and nebs - continue  with further valve, pulmonary toilet  Pancytopenia (HCC)/neutropenia - Neutropenia:Secondary to recent chemotherapy. Management per oncology, on IV Granix since admission, changed 9/3 to Neupogen ,Maintain neutropenic precautions, very slow to respond, but today significantly improvement ANC count of 811, B-14 and folic acid within normal limits - Thrombocytopenia: Received 1 unit platelet 9/1, will receive  another unit 9/4, platelet count today is 20 K, continue to monitor very likely will need another unit of platelets tomorrow if she drops below 20 K. - Anemia, received 1 unit PRBC 03/3039 mg 7.3, monitor closely and transfuse as needed.  Multiple myeloma ([place]) - Follows with Dr Burr Medico  Coronary atherosclerosis of native coronary artery - Continue Toprol. hold Plavix given thrombocytopenia  Protein-calorie malnutrition ([place]) - Continue with ensure  Hypokalemia/hypophosphatemia Replenished      Code Status : Full code  Family Communication  : None at bedside  Disposition Plan  : Pending further workup, remains in stepdown  Consults  : Dr Burr Medico, PCCM  Procedures  : None  DVT Prophylaxis  :  SCDs  Lab Results  Component Value Date   PLT 20 (LL) 03/19/2016    Antibiotics  :    Anti-infectives    Start     Dose/Rate Route Frequency Ordered Stop   03/18/16 2200  vancomycin (VANCOCIN) IVPB 750 mg/150 ml premix     750 mg 150 mL/hr over 60 Minutes Intravenous Every 12 hours 03/18/16 2134     03/16/16 0900  vancomycin (VANCOCIN) IVPB 1000 mg/200 mL premix  Status:  Discontinued     1,000 mg 200 mL/hr over 60 Minutes Intravenous Every 12 hours 03/16/16 0802 03/18/16 2133   03/12/16 2200  vancomycin  (VANCOCIN) IVPB 1000 mg/200 mL premix  Status:  Discontinued     1,000 mg 200 mL/hr over 60 Minutes Intravenous Every 12 hours 03/12/16 1246 03/14/16 0937   03/12/16 1245  vancomycin (VANCOCIN) IVPB 1000 mg/200 mL premix     1,000 mg 200 mL/hr over 60 Minutes Intravenous STAT 03/12/16 1233 03/12/16 1348   03/12/16 1200  ceFEPIme (MAXIPIME) 2 g in dextrose 5 % 50 mL IVPB     2 g 100 mL/hr over 30 Minutes Intravenous Every 8 hours 03/12/16 0843     03/11/16 0000  vancomycin (VANCOCIN) IVPB 750 mg/150 ml premix  Status:  Discontinued     750 mg 150 mL/hr over 60 Minutes Intravenous Every 12 hours 03/10/16 1205 03/12/16 1233   03/10/16 2200  acyclovir (ZOVIRAX) tablet 400 mg     400 mg Oral 2 times daily 03/10/16 1511     03/10/16 2000  ceFEPIme (MAXIPIME) 1 g in dextrose 5 % 50 mL IVPB  Status:  Discontinued     1 g 100 mL/hr over 30 Minutes Intravenous Every 8 hours 03/10/16 1307 03/12/16 0843   03/10/16 1400  aztreonam (AZACTAM) 2 g in dextrose 5 % 50 mL IVPB  Status:  Discontinued     2 g 100 mL/hr over 30 Minutes Intravenous Every 8 hours 03/10/16 1341 03/10/16 1344   03/10/16 1115  ceFEPIme (MAXIPIME) 2 g in dextrose 5 % 50 mL IVPB     2 g 100 mL/hr over 30 Minutes Intravenous STAT 03/10/16 1100 03/10/16 1306   03/10/16 1100  vancomycin (VANCOCIN) IVPB 750 mg/150 ml premix     750 mg 150 mL/hr over 60 Minutes Intravenous STAT 03/10/16 1057 03/10/16 1406   03/10/16 1045  aztreonam (AZACTAM) 2 g in dextrose 5 % 50 mL IVPB  Status:  Discontinued     2 g 100 mL/hr over 30 Minutes Intravenous  Once 03/10/16 1042 03/10/16 1100        Objective:   Vitals:   03/18/16 2021 03/18/16 2228 03/19/16 0241 03/19/16 0802  BP: 103/63 (!) 111/56 107/65   Pulse: 90 100 89   Resp: Marland Kitchen)  28 (!) 25 (!) 23   Temp:  98.7 F (37.1 C) 98.4 F (36.9 C) 98.2 F (36.8 C)  TempSrc:    Oral  SpO2: (!) 86% 96% 94% 93%  Weight:      Height:        Wt Readings from Last 3 Encounters:  03/10/16 56.5  kg (124 lb 9 oz)  03/07/16 56.5 kg (124 lb 9.6 oz)  02/15/16 55.6 kg (122 lb 9.6 oz)     Intake/Output Summary (Last 24 hours) at 03/19/16 1023 Last data filed at 03/19/16 0916  Gross per 24 hour  Intake          2347.42 ml  Output              925 ml  Net          1422.42 ml     Physical Exam  Gen: not in distress, frail HEENT: no pallor, Moist mucosa supple neck Chest: Fine bibasilar crackles, good air entry CVS: N S1&S2, no murmurs, rubs or gallop GI: soft, NT, ND, BS+ Musculoskeletal: warm, mild pitting edema CNS: Alert and oriented    Data Review:    CBC  Recent Labs Lab 03/15/16 0450 03/16/16 0523 03/17/16 0537 03/18/16 0403 03/19/16 0350  WBC 0.3* 0.3* 0.4* 0.6* 1.1*  HGB 8.6* 8.2* 7.3* 8.2* 8.4*  HCT 24.2* 23.1* 20.4* 23.0* 23.7*  PLT 18* 34* 23* 14* 20*  MCV 98.8 98.7 97.1 92.0 92.6  MCH 35.1* 35.0* 34.8* 32.8 32.8  MCHC 35.5 35.5 35.8 35.7 35.4  RDW 15.3 15.4 15.3 18.2* 17.8*  LYMPHSABS 0.2* 0.1* 0.2* 0.2* 0.4*  MONOABS 0.0* 0.0* 0.1 0.1 0.1  EOSABS 0.1 0.0 0.0 0.0 0.0  BASOSABS 0.0 0.0 0.0 0.0 0.0    Chemistries   Recent Labs Lab 03/15/16 0450 03/16/16 0523 03/17/16 0537 03/18/16 0403 03/19/16 0350  NA 131* 131* 131* 134* 134*  K 3.7 3.3* 3.4* 4.3 4.2  CL 104 101 101 108 109  CO2 _0 20*  GLUCOSE 110* 127* 111* 163* 139*  BUN _1 24* 29*  CREATININE 0.75 0.66 0.68 0.68 0.72  CALCIUM 7.9* 7.6* 7.2* 7.4* 7.5*  MG  --  1.7  --   --   --    ------------------------------------------------------------------------------------------------------------------ No results for input(s): CHOL, HDL, LDLCALC, TRIG, CHOLHDL, LDLDIRECT in the last 72 hours.  No results found for: HGBA1C ------------------------------------------------------------------------------------------------------------------ No results for input(s): TSH, T4TOTAL, T3FREE, THYROIDAB in the last 72 hours.  Invalid input(s):  FREET3 ------------------------------------------------------------------------------------------------------------------  Recent Labs  03/16/16 1115  VITAMINB12 736  FOLATE 12.8    Coagulation profile No results for input(s): INR, PROTIME in the last 168 hours.  No results for input(s): DDIMER in the last 72 hours.  Cardiac Enzymes No results for input(s): CKMB, TROPONINI, MYOGLOBIN in the last 168 hours.  Invalid input(s): CK ------------------------------------------------------------------------------------------------------------------ No results found for: BNP  Inpatient Medications  Scheduled Meds: . acyclovir  400 mg Oral BID  . atorvastatin  40 mg Oral Daily  . calcium carbonate  1,250 mg Oral Daily  . ceFEPime (MAXIPIME) IV  2 g Intravenous Q8H  . feeding supplement (ENSURE ENLIVE)  237 mL Oral TID BM  . filgrastim  480 mcg Subcutaneous Q24H  . fluticasone  2 spray Each Nare Daily  . guaiFENesin  1,200 mg Oral BID  . hydrocortisone sod succinate (SOLU-CORTEF) inj  100 mg Intravenous Q8H  . ipratropium-albuterol  3 mL Nebulization BID  . phosphorus  250 mg Oral Daily  . potassium chloride  20 mEq Oral Daily  . sodium chloride HYPERTONIC  4 mL Nebulization BID  . vancomycin  750 mg Intravenous Q12H   Continuous Infusions: . sodium chloride 10 mL/hr at 03/19/16 0922  . phenylephrine (NEO-SYNEPHRINE) Adult infusion     PRN Meds:.acetaminophen, guaiFENesin-dextromethorphan, ipratropium-albuterol, morphine, ondansetron, prochlorperazine, zolpidem  Micro Results Recent Results (from the past 240 hour(s))  Urine culture     Status: None   Collection Time: 03/10/16  9:54 AM  Result Value Ref Range Status   Specimen Description URINE, RANDOM  Final   Special Requests NONE  Final   Culture NO GROWTH Performed at [facility]   Final   Report Status 03/11/2016 FINAL  Final  Culture, blood (routine x 2)     Status: None   Collection Time: 03/10/16  9:12  PM  Result Value Ref Range Status   Specimen Description BLOOD LEFT ARM  Final   Special Requests BOTTLES DRAWN AEROBIC AND ANAEROBIC 5CC  Final   Culture   Final    NO GROWTH 5 DAYS Performed at [facility]    Report Status 03/16/2016 FINAL  Final  Culture, blood (routine x 2)     Status: None   Collection Time: 03/10/16  9:18 PM  Result Value Ref Range Status   Specimen Description BLOOD LEFT ARM  Final   Special Requests BOTTLES DRAWN AEROBIC AND ANAEROBIC 5CC  Final   Culture   Final    NO GROWTH 5 DAYS Performed at [facility]    Report Status 03/16/2016 FINAL  Final  Culture, sputum-assessment     Status: None   Collection Time: 03/11/16  4:24 PM  Result Value Ref Range Status   Specimen Description SPUTUM  Final   Special Requests NONE  Final   Sputum evaluation   Final    THIS SPECIMEN IS ACCEPTABLE. RESPIRATORY CULTURE REPORT TO FOLLOW.   Report Status 03/11/2016 FINAL  Final  Culture, respiratory (NON-Expectorated)     Status: None   Collection Time: 03/11/16  4:24 PM  Result Value Ref Range Status   Specimen Description SPUTUM  Final   Special Requests NONE  Final   Gram Stain   Final    ABUNDANT WBC PRESENT,BOTH PMN AND MONONUCLEAR FEW HYPHAE RARE GRAM NEGATIVE RODS RARE GRAM POSITIVE RODS FEW GRAM POSITIVE COCCI    Culture   Final    Consistent with normal respiratory flora. Performed at [facility]    Report Status 03/13/2016 FINAL  Final  Culture, blood (Routine X 2) w Reflex to ID Panel     Status: None (Preliminary result)   Collection Time: 03/16/16  9:40 AM  Result Value Ref Range Status   Specimen Description BLOOD LEFT ANTECUBITAL  Final   Special Requests IN PEDIATRIC BOTTLE 2CC  Final   Culture   Final    NO GROWTH 2 DAYS Performed at [facility]    Report Status PENDING  Incomplete  Culture, blood (Routine X 2) w Reflex to ID Panel     Status: None (Preliminary result)   Collection Time: 03/16/16  9:44 AM   Result Value Ref Range Status   Specimen Description BLOOD RIGHT HAND  Final   Special Requests IN PEDIATRIC BOTTLE .5CC  Final   Culture   Final    NO GROWTH 2 DAYS Performed at [facility]    Report Status PENDING  Incomplete  Radiology Reports Dg Chest 2 View  Result Date: 03/17/2016 CLINICAL DATA:  Sepsis and cough. Neutropenia and history of multiple myeloma. EXAM: CHEST  2 VIEW COMPARISON:  03/16/2016 FINDINGS: Bilateral lower lung infiltrates again noted, left greater than right. Associated small bilateral pleural effusions appears stable. Stable chronic lung disease. The heart size is normal. Stable compression deformities of the thoracic spine and left scapular lesion. Poorly healed left-sided rib fractures also again noted. IMPRESSION: Bilateral lower lobe pulmonary infiltrates, left greater than right, appear relatively stable. Associated small bilateral pleural effusions. Electronically Signed   By: Aletta Edouard M.D.   On: 03/17/2016 11:30   Dg Chest 2 View  Result Date: 03/16/2016 CLINICAL DATA:  Fever.  History of multiple myeloma. EXAM: CHEST  2 VIEW COMPARISON:  03/03/2016. FINDINGS: Underlying chronic lung disease with emphysema and pulmonary scarring. Superimposed bilateral infiltrates and small effusions. Numerous bone lesions are again demonstrated consistent with known multiple myeloma. IMPRESSION: Chronic underlying lung disease with superimposed infiltrates and effusions. Multiple bone lesions consistent with known multiple myeloma. Electronically Signed   By: Marijo Sanes M.D.   On: 03/16/2016 08:49   Dg Chest 2 View  Result Date: 03/10/2016 CLINICAL DATA:  Cough and shortness of breath.  Chest pain EXAM: CHEST  2 VIEW COMPARISON:  11/02/2014 FINDINGS: Normal heart size. Advanced chronic interstitial lung changes are again identified. Superimposed bibasilar opacities are identified. There are small pleural effusions present. Chronic multi level compression  deformities are identified within the thoracic spine. IMPRESSION: 1. Bibasilar opacities which may represent atelectasis and/or pneumonia. 2. Small bilateral pleural effusions. Electronically Signed   By: Kerby Moors M.D.   On: 03/10/2016 10:25   Dg Chest Port 1 View  Result Date: 03/19/2016 CLINICAL DATA:  Shortness of breath/respiratory failure EXAM: PORTABLE CHEST 1 VIEW COMPARISON:  March 18, 2016 FINDINGS: Interstitial edema is stable. There are pleural effusions bilaterally with apparent loculated effusion at the right base. There is a degree of bibasilar atelectasis. A degree of superimposed pneumonia in the left base is questioned. Heart is mildly enlarged with pulmonary venous hypertension. No adenopathy is evident. Bones are osteoporotic. There are old fractures involving multiple ribs on the left. The inferior left scapula, and mid left clavicle. IMPRESSION: Congestive heart failure. Suspect superimposed pneumonia left lower lobe. Multiple prior fractures. Bones osteoporotic. Electronically Signed   By: Lowella Grip III M.D.   On: 03/19/2016 07:07   Dg Chest Port 1 View  Result Date: 03/18/2016 CLINICAL DATA:  Acute respiratory failure, multiple myeloma EXAM: PORTABLE CHEST 1 VIEW COMPARISON:  Multiple priors, most recently 03/17/2016 FINDINGS: Upper lobe predominant scarring, chronic. Possible mild perihilar edema. Small bilateral pleural effusions, mildly increased. Associated patchy right lower lobe opacity, atelectasis versus pneumonia. Suspected left lower lobe opacity, although obscured by overlying soft tissues. Cardiomegaly. Degenerative changes of the thoracic spine. IMPRESSION: Cardiomegaly with possible mild perihilar edema and increasing small bilateral pleural effusions. Associated patchy right lower lobe opacity, atelectasis versus pneumonia. Electronically Signed   By: Julian Hy M.D.   On: 03/18/2016 07:32   Dg Chest Port 1 View  Result Date: 03/11/2016 CLINICAL  DATA:  Productive cough and chest congestion for the past 2 weeks. Fever and hypoxia. Shortness of breath. Multiple myeloma. EXAM: PORTABLE CHEST 1 VIEW COMPARISON:  Yesterday. FINDINGS: Increased patchy density at both lung bases. Small bilateral pleural effusions. Diffuse osteopenia. Old, healed left clavicle fracture and old, healed left rib fractures. Previously described chronic thoracic vertebral compression deformities. IMPRESSION: Increased probable  pneumonia at both lung bases with increased bilateral pleural fluid. Electronically Signed   By: Claudie Revering M.D.   On: 03/11/2016 18:18     Kadence Mimbs M.D on 03/19/2016 at 10:23 AM  Between 7am to 7pm - Pager - [phone]  After 7pm go to [email or web address] - password [facility]  [facility] -  Office  [phone]

## 2016-03-19 NOTE — Progress Notes (Signed)
LIZANN EDELMAN   DOB:June 15, 1941   JA#:[id number]   [phone]  Hematology and Oncology follow up note  Subjective: Pt was transferred to ICU over the weekend due to hypotension and fever, improved now. She ambulated in the hallway yesterday, dyspnea improved, she is on [place] oxygen again, still has cough and some chest congestion.  Objective:  Vitals:   03/19/16 0241 03/19/16 0802  BP: 107/65   Pulse: 89   Resp: (!) 23   Temp: 98.4 F (36.9 C) 98.2 F (36.8 C)    Body mass index is 20.1 kg/m.  Intake/Output Summary (Last 24 hours) at 03/19/16 1055 Last data filed at 03/19/16 0916  Gross per 24 hour  Intake          2145.42 ml  Output              925 ml  Net          1220.42 ml     Sclerae unicteric  Oropharynx clear  No peripheral adenopathy  Lungs clear -- no rales or rhonchi  Heart regular rate and rhythm  Abdomen benign  MSK no focal spinal tenderness, no peripheral edema  Neuro nonfocal    CBG (last 3)  No results for input(s): GLUCAP in the last 72 hours.   Labs:  Lab Results  Component Value Date   WBC 1.1 (LL) 03/19/2016   HGB 8.4 (L) 03/19/2016   HCT 23.7 (L) 03/19/2016   MCV 92.6 03/19/2016   PLT 20 (LL) 03/19/2016   NEUTROABS 0.6 (L) 03/19/2016    CMP Latest Ref Rng & Units 03/19/2016 03/18/2016 03/17/2016  Glucose 65 - 99 mg/dL 139(H) 163(H) 111(H)  BUN 6 - 20 mg/dL 29(H) 24(H) 18  Creatinine 0.44 - 1.00 mg/dL 0.72 0.68 0.68  Sodium 135 - 145 mmol/L 134(L) 134(L) 131(L)  Potassium 3.5 - 5.1 mmol/L 4.2 4.3 3.4(L)  Chloride 101 - 111 mmol/L 109 108 101  CO2 22 - 32 mmol/L 20(L) 22 24  Calcium 8.9 - 10.3 mg/dL 7.5(L) 7.4(L) 7.2(L)  Total Protein 6.5 - 8.1 g/dL - - -  Total Bilirubin 0.3 - 1.2 mg/dL - - -  Alkaline Phos 38 - 126 U/L - - -  AST 15 - 41 U/L - - -  ALT 14 - 54 U/L - - -     Urine Studies No results for input(s): UHGB, CRYS in the last 72 hours.  Invalid input(s): UACOL, UAPR, USPG, UPH, UTP, UGL, UKET, UBIL, UNIT, UROB, [place],  UEPI, UWBC, [place], [place]  Basic Metabolic Panel:  Recent Labs Lab 03/15/16 0450 03/16/16 0523 03/17/16 0537 03/18/16 0403 03/19/16 0350  NA 131* 131* 131* 134* 134*  K 3.7 3.3* 3.4* 4.3 4.2  CL 104 101 101 108 109  CO2 24 24 24 22  20*  GLUCOSE 110* 127* 111* 163* 139*  BUN 17 18 18  24* 29*  CREATININE 0.75 0.66 0.68 0.68 0.72  CALCIUM 7.9* 7.6* 7.2* 7.4* 7.5*  MG  --  1.7  --   --   --   PHOS 1.8* 2.0* 2.7  --   --    GFR Estimated Creatinine Clearance: 55.9 mL/min (by C-G formula based on SCr of 0.8 mg/dL). Liver Function Tests: No results for input(s): AST, ALT, ALKPHOS, BILITOT, PROT, ALBUMIN in the last 168 hours. No results for input(s): LIPASE, AMYLASE in the last 168 hours. No results for input(s): AMMONIA in the last 168 hours. Coagulation profile No results for input(s): INR,  PROTIME in the last 168 hours.  CBC:  Recent Labs Lab 03/15/16 0450 03/16/16 0523 03/17/16 0537 03/18/16 0403 03/19/16 0350  WBC 0.3* 0.3* 0.4* 0.6* 1.1*  NEUTROABS 0.0* 0.2* 0.1* 0.3* 0.6*  HGB 8.6* 8.2* 7.3* 8.2* 8.4*  HCT 24.2* 23.1* 20.4* 23.0* 23.7*  MCV 98.8 98.7 97.1 92.0 92.6  PLT 18* 34* 23* 14* 20*   Cardiac Enzymes: No results for input(s): CKTOTAL, CKMB, CKMBINDEX, TROPONINI in the last 168 hours. BNP: Invalid input(s): POCBNP CBG:  Recent Labs Lab 03/15/16 0754  GLUCAP 107*   D-Dimer No results for input(s): DDIMER in the last 72 hours. Hgb A1c No results for input(s): HGBA1C in the last 72 hours. Lipid Profile No results for input(s): CHOL, HDL, LDLCALC, TRIG, CHOLHDL, LDLDIRECT in the last 72 hours. Thyroid function studies No results for input(s): TSH, T4TOTAL, T3FREE, THYROIDAB in the last 72 hours.  Invalid input(s): FREET3 Anemia work up  Recent Labs  03/16/16 1115  [place] 12.8   Microbiology Recent Results (from the past 240 hour(s))  Urine culture     Status: None   Collection Time: 03/10/16  9:54 AM   Result Value Ref Range Status   Specimen Description URINE, RANDOM  Final   Special Requests NONE  Final   Culture NO GROWTH Performed at [facility]   Final   Report Status 03/11/2016 FINAL  Final  Culture, blood (routine x 2)     Status: None   Collection Time: 03/10/16  9:12 PM  Result Value Ref Range Status   Specimen Description BLOOD LEFT ARM  Final   Special Requests BOTTLES DRAWN AEROBIC AND ANAEROBIC 5CC  Final   Culture   Final    NO GROWTH 5 DAYS Performed at [facility]    Report Status 03/16/2016 FINAL  Final  Culture, blood (routine x 2)     Status: None   Collection Time: 03/10/16  9:18 PM  Result Value Ref Range Status   Specimen Description BLOOD LEFT ARM  Final   Special Requests BOTTLES DRAWN AEROBIC AND ANAEROBIC 5CC  Final   Culture   Final    NO GROWTH 5 DAYS Performed at [facility]    Report Status 03/16/2016 FINAL  Final  Culture, sputum-assessment     Status: None   Collection Time: 03/11/16  4:24 PM  Result Value Ref Range Status   Specimen Description SPUTUM  Final   Special Requests NONE  Final   Sputum evaluation   Final    THIS SPECIMEN IS ACCEPTABLE. RESPIRATORY CULTURE REPORT TO FOLLOW.   Report Status 03/11/2016 FINAL  Final  Culture, respiratory (NON-Expectorated)     Status: None   Collection Time: 03/11/16  4:24 PM  Result Value Ref Range Status   Specimen Description SPUTUM  Final   Special Requests NONE  Final   Gram Stain   Final    ABUNDANT WBC PRESENT,BOTH PMN AND MONONUCLEAR FEW HYPHAE RARE GRAM NEGATIVE RODS RARE GRAM POSITIVE RODS FEW GRAM POSITIVE COCCI    Culture   Final    Consistent with normal respiratory flora. Performed at [facility]    Report Status 03/13/2016 FINAL  Final  Culture, blood (Routine X 2) w Reflex to ID Panel     Status: None (Preliminary result)   Collection Time: 03/16/16  9:40 AM  Result Value Ref Range Status   Specimen Description BLOOD LEFT ANTECUBITAL   Final   Special Requests IN PEDIATRIC  BOTTLE 2CC  Final   Culture   Final    NO GROWTH 2 DAYS Performed at [facility]    Report Status PENDING  Incomplete  Culture, blood (Routine X 2) w Reflex to ID Panel     Status: None (Preliminary result)   Collection Time: 03/16/16  9:44 AM  Result Value Ref Range Status   Specimen Description BLOOD RIGHT HAND  Final   Special Requests IN PEDIATRIC BOTTLE .5CC  Final   Culture   Final    NO GROWTH 2 DAYS Performed at [facility]    Report Status PENDING  Incomplete      Studies:  Dg Chest 2 View  Result Date: 03/17/2016 CLINICAL DATA:  Sepsis and cough. Neutropenia and history of multiple myeloma. EXAM: CHEST  2 VIEW COMPARISON:  03/16/2016 FINDINGS: Bilateral lower lung infiltrates again noted, left greater than right. Associated small bilateral pleural effusions appears stable. Stable chronic lung disease. The heart size is normal. Stable compression deformities of the thoracic spine and left scapular lesion. Poorly healed left-sided rib fractures also again noted. IMPRESSION: Bilateral lower lobe pulmonary infiltrates, left greater than right, appear relatively stable. Associated small bilateral pleural effusions. Electronically Signed   By: Aletta Edouard M.D.   On: 03/17/2016 11:30   Dg Chest Port 1 View  Result Date: 03/19/2016 CLINICAL DATA:  Shortness of breath/respiratory failure EXAM: PORTABLE CHEST 1 VIEW COMPARISON:  March 18, 2016 FINDINGS: Interstitial edema is stable. There are pleural effusions bilaterally with apparent loculated effusion at the right base. There is a degree of bibasilar atelectasis. A degree of superimposed pneumonia in the left base is questioned. Heart is mildly enlarged with pulmonary venous hypertension. No adenopathy is evident. Bones are osteoporotic. There are old fractures involving multiple ribs on the left. The inferior left scapula, and mid left clavicle. IMPRESSION: Congestive heart  failure. Suspect superimposed pneumonia left lower lobe. Multiple prior fractures. Bones osteoporotic. Electronically Signed   By: Lowella Grip III M.D.   On: 03/19/2016 07:07   Dg Chest Port 1 View  Result Date: 03/18/2016 CLINICAL DATA:  Acute respiratory failure, multiple myeloma EXAM: PORTABLE CHEST 1 VIEW COMPARISON:  Multiple priors, most recently 03/17/2016 FINDINGS: Upper lobe predominant scarring, chronic. Possible mild perihilar edema. Small bilateral pleural effusions, mildly increased. Associated patchy right lower lobe opacity, atelectasis versus pneumonia. Suspected left lower lobe opacity, although obscured by overlying soft tissues. Cardiomegaly. Degenerative changes of the thoracic spine. IMPRESSION: Cardiomegaly with possible mild perihilar edema and increasing small bilateral pleural effusions. Associated patchy right lower lobe opacity, atelectasis versus pneumonia. Electronically Signed   By: Julian Hy M.D.   On: 03/18/2016 07:32    Assessment: 75 y.o. female with history of multiple myeloma status post autotransplant in August 2014, recently had disease relapse, and switched to new chemotherapy treatment with pomalidomide, dexa and daratumumab    1. HCAP with hypoxic respiratory failure, slowly improving  2. Sepsis with hypotension, improved  3. Multiple myeloma, relapsed in July 2017, on new chemo  4. Pancytopenia due to chemotherapy 5. Coronary artery disease, history of STEMI in August 2015  Plan:  -she is on vancomycin and cefepime with slow improvement  -continue Neupogen daily until ANC>1.5 -plt 20K today, no bleeding, will hold on plt transfusion today. Consider blood transfusion if Hb<7.5 or Plt <20K -continue holding MM treatment  -I will follow up closely.  I appreciate excellent care from the hospitalist team, I spoke with Dr. Waldron Labs today.  [name], MD 03/19/2016  10:55 AM

## 2016-03-20 DIAGNOSIS — D702 Other drug-induced agranulocytosis: Secondary | ICD-10-CM

## 2016-03-20 LAB — CBC WITH DIFFERENTIAL/PLATELET
BASOS ABS: 0 10*3/uL (ref 0.0–0.1)
Basophils Relative: 1 %
EOS ABS: 0 10*3/uL (ref 0.0–0.7)
Eosinophils Relative: 1 %
HCT: 22.9 % — ABNORMAL LOW (ref 36.0–46.0)
HEMOGLOBIN: 8 g/dL — AB (ref 12.0–15.0)
LYMPHS PCT: 31 %
Lymphs Abs: 0.5 10*3/uL — ABNORMAL LOW (ref 0.7–4.0)
MCH: 32.9 pg (ref 26.0–34.0)
MCHC: 34.9 g/dL (ref 30.0–36.0)
MCV: 94.2 fL (ref 78.0–100.0)
Monocytes Absolute: 0.2 10*3/uL (ref 0.1–1.0)
Monocytes Relative: 11 %
NEUTROS ABS: 0.9 10*3/uL — AB (ref 1.7–7.7)
Neutrophils Relative %: 56 %
PLATELETS: 19 10*3/uL — AB (ref 150–400)
RBC: 2.43 MIL/uL — ABNORMAL LOW (ref 3.87–5.11)
RDW: 17.6 % — AB (ref 11.5–15.5)
WBC: 1.6 10*3/uL — ABNORMAL LOW (ref 4.0–10.5)

## 2016-03-20 LAB — BASIC METABOLIC PANEL
ANION GAP: 3 — AB (ref 5–15)
BUN: 34 mg/dL — ABNORMAL HIGH (ref 6–20)
CALCIUM: 7.4 mg/dL — AB (ref 8.9–10.3)
CO2: 23 mmol/L (ref 22–32)
Chloride: 111 mmol/L (ref 101–111)
Creatinine, Ser: 0.73 mg/dL (ref 0.44–1.00)
GLUCOSE: 124 mg/dL — AB (ref 65–99)
Potassium: 4.3 mmol/L (ref 3.5–5.1)
Sodium: 137 mmol/L (ref 135–145)

## 2016-03-20 MED ORDER — PRO-STAT SUGAR FREE PO LIQD
30.0000 mL | Freq: Two times a day (BID) | ORAL | Status: DC
  Administered 2016-03-20 – 2016-03-29 (×18): 30 mL via ORAL
  Filled 2016-03-20 (×18): qty 30

## 2016-03-20 NOTE — Progress Notes (Signed)
PROGRESS NOTE    Shannon Rush  OEH:[id number] DOB: 1941-06-16 DOA: 03/10/2016  PCP: [name], MD   Brief Narrative:  75 year old female with history of multiple myeloma on chemotherapy (pomalidomide, dexamethasone, daratumumab), CAD, hyperlipidemia, osteoporosis, admitted with sepsis secondary to pneumonia, as well with neutropenia secondary to chemotherapy- treated with vancomycin and cefepime- remained septic, hypotensive and therefore transferred to stepdown on 9/3 - Blood pressure improved on stress dose steroids.   Subjective: States she feels much better today. No dyspnea. Coughing up yellowish sputum. No pain.  Assessment & Plan:   Sepsis ([place]) - Secondary to healthcare associated pneumonia  - On empiric vancomycin and cefepime, stopped vancomycin 8/31, resumed on 9/2 as she became febrile again. - Cultures so far negative. -transferred to stepdown on 9/3 for hypotension- did not need any pressors-pressure improved with stress dose steroids  HCAP-acute respiratory failure - Left lower lobe-Vancomycin, cefepime, acyclovir -Oxygen weaned down to 3.5 L today  Acute hypoxic respiratory failure - Secondary to HCAP and mild fluid overload. - Wheezing improved after IV steroids and nebs - continue  with further valve, pulmonary toilet  Pancytopenia  - Neutropenia:Secondary to recent chemotherapy. Management per oncology, on IV Granix since admission, changed 9/3 to Neupogen ,Maintain neutropenic precautions - Thrombocytopenia: Received 2 unit platelet 9/1,  9/4- continue to follow counts- no obvious bleeding - Anemia, received 1 unit PRBC 9/30 for hemoglobin of 7.3-hemoglobin has subsequently been stable between 8-9  Multiple myeloma ([place]) - Follows with Dr Burr Medico who was following in the hospital as well  Coronary atherosclerosis of native coronary artery - Continue Toprol. hold Plavix given thrombocytopenia  Protein-calorie malnutrition ([place]) -  Continue with ensure  Hypokalemia/hypophosphatemia Replenished   DVT prophylaxis: SCDs only due to thrombocytopenia Code Status: Full Family Communication:  Disposition Plan: Transfer out of stepdown unit Consultants:   Pulmonary, oncology Procedures:    Antimicrobials:  Anti-infectives    Start     Dose/Rate Route Frequency Ordered Stop   03/18/16 2200  vancomycin (VANCOCIN) IVPB 750 mg/150 ml premix     750 mg 150 mL/hr over 60 Minutes Intravenous Every 12 hours 03/18/16 2134     03/16/16 0900  vancomycin (VANCOCIN) IVPB 1000 mg/200 mL premix  Status:  Discontinued     1,000 mg 200 mL/hr over 60 Minutes Intravenous Every 12 hours 03/16/16 0802 03/18/16 2133   03/12/16 2200  vancomycin (VANCOCIN) IVPB 1000 mg/200 mL premix  Status:  Discontinued     1,000 mg 200 mL/hr over 60 Minutes Intravenous Every 12 hours 03/12/16 1246 03/14/16 0937   03/12/16 1245  vancomycin (VANCOCIN) IVPB 1000 mg/200 mL premix     1,000 mg 200 mL/hr over 60 Minutes Intravenous STAT 03/12/16 1233 03/12/16 1348   03/12/16 1200  ceFEPIme (MAXIPIME) 2 g in dextrose 5 % 50 mL IVPB     2 g 100 mL/hr over 30 Minutes Intravenous Every 8 hours 03/12/16 0843     03/11/16 0000  vancomycin (VANCOCIN) IVPB 750 mg/150 ml premix  Status:  Discontinued     750 mg 150 mL/hr over 60 Minutes Intravenous Every 12 hours 03/10/16 1205 03/12/16 1233   03/10/16 2200  acyclovir (ZOVIRAX) tablet 400 mg     400 mg Oral 2 times daily 03/10/16 1511     03/10/16 2000  ceFEPIme (MAXIPIME) 1 g in dextrose 5 % 50 mL IVPB  Status:  Discontinued     1 g 100 mL/hr over 30 Minutes Intravenous Every 8 hours 03/10/16 1307 03/12/16  3646   03/10/16 1400  aztreonam (AZACTAM) 2 g in dextrose 5 % 50 mL IVPB  Status:  Discontinued     2 g 100 mL/hr over 30 Minutes Intravenous Every 8 hours 03/10/16 1341 03/10/16 1344   03/10/16 1115  ceFEPIme (MAXIPIME) 2 g in dextrose 5 % 50 mL IVPB     2 g 100 mL/hr over 30 Minutes Intravenous STAT  03/10/16 1100 03/10/16 1306   03/10/16 1100  vancomycin (VANCOCIN) IVPB 750 mg/150 ml premix     750 mg 150 mL/hr over 60 Minutes Intravenous STAT 03/10/16 1057 03/10/16 1406   03/10/16 1045  aztreonam (AZACTAM) 2 g in dextrose 5 % 50 mL IVPB  Status:  Discontinued     2 g 100 mL/hr over 30 Minutes Intravenous  Once 03/10/16 1042 03/10/16 1100       Objective: Vitals:   03/20/16 1148 03/20/16 1149 03/20/16 1237 03/20/16 1303  BP:   114/63 116/80  Pulse:   94 92  Resp:   (!) 22 20  Temp:  97.7 F (36.5 C)  97.6 F (36.4 C)  TempSrc:  Oral  Oral  SpO2: 99%  96% 94%  Weight:      Height:        Intake/Output Summary (Last 24 hours) at 03/20/16 1401 Last data filed at 03/20/16 0952  Gross per 24 hour  Intake          1024.99 ml  Output             1475 ml  Net          -450.01 ml   Filed Weights   03/10/16 1447  Weight: 56.5 kg (124 lb 9 oz)    Examination: General exam: Appears comfortable  HEENT: PERRLA, oral mucosa moist, no sclera icterus or thrush Respiratory system: Clear to auscultation. Respiratory effort normal. Cardiovascular system: S1 & S2 heard, RRR.  No murmurs  Gastrointestinal system: Abdomen soft, non-tender, nondistended. Normal bowel sound. No organomegaly Central nervous system: Alert and oriented. No focal neurological deficits. Extremities: No cyanosis, clubbing or edema Skin: No rashes or ulcers Psychiatry:  Mood & affect appropriate.     Data Reviewed: I have personally reviewed following labs and imaging studies  CBC:  Recent Labs Lab 03/16/16 0523 03/17/16 0537 03/18/16 0403 03/19/16 0350 03/20/16 0404  WBC 0.3* 0.4* 0.6* 1.1* 1.6*  NEUTROABS 0.2* 0.1* 0.3* 0.6* 0.9*  HGB 8.2* 7.3* 8.2* 8.4* 8.0*  HCT 23.1* 20.4* 23.0* 23.7* 22.9*  MCV 98.7 97.1 92.0 92.6 94.2  PLT 34* 23* 14* 20* 19*   Basic Metabolic Panel:  Recent Labs Lab 03/15/16 0450 03/16/16 0523 03/17/16 0537 03/18/16 0403 03/19/16 0350 03/20/16 0404  NA 131*  131* 131* 134* 134* 137  K 3.7 3.3* 3.4* 4.3 4.2 4.3  CL 104 101 101 108 109 111  CO2 _0 20* 23  GLUCOSE 110* 127* 111* 163* 139* 124*  BUN _1 24* 29* 34*  CREATININE 0.75 0.66 0.68 0.68 0.72 0.73  CALCIUM 7.9* 7.6* 7.2* 7.4* 7.5* 7.4*  MG  --  1.7  --   --   --   --   PHOS 1.8* 2.0* 2.7  --   --   --    GFR: Estimated Creatinine Clearance: 55.9 mL/min (by C-G formula based on SCr of 0.8 mg/dL). Liver Function Tests: No results for input(s): AST, ALT, ALKPHOS, BILITOT, PROT, ALBUMIN in the last 168 hours. No results for input(s):  LIPASE, AMYLASE in the last 168 hours. No results for input(s): AMMONIA in the last 168 hours. Coagulation Profile: No results for input(s): INR, PROTIME in the last 168 hours. Cardiac Enzymes: No results for input(s): CKTOTAL, CKMB, CKMBINDEX, TROPONINI in the last 168 hours. BNP (last 3 results) No results for input(s): PROBNP in the last 8760 hours. HbA1C: No results for input(s): HGBA1C in the last 72 hours. CBG:  Recent Labs Lab 03/15/16 0754  GLUCAP 107*   Lipid Profile: No results for input(s): CHOL, HDL, LDLCALC, TRIG, CHOLHDL, LDLDIRECT in the last 72 hours. Thyroid Function Tests: No results for input(s): TSH, T4TOTAL, FREET4, T3FREE, THYROIDAB in the last 72 hours. Anemia Panel: No results for input(s): VITAMINB12, FOLATE, FERRITIN, TIBC, IRON, RETICCTPCT in the last 72 hours. Urine analysis:    Component Value Date/Time   COLORURINE AMBER (A) 03/16/2016 1645   APPEARANCEUR CLOUDY (A) 03/16/2016 1645   LABSPEC 1.029 03/16/2016 1645   LABSPEC 1.015 08/20/2012 1124   PHURINE 6.0 03/16/2016 1645   GLUCOSEU 100 (A) 03/16/2016 1645   GLUCOSEU Negative 08/20/2012 1124   HGBUR MODERATE (A) 03/16/2016 1645   HGBUR negative 10/22/2007 1336   BILIRUBINUR NEGATIVE 03/16/2016 1645   BILIRUBINUR Negative 08/20/2012 1124   KETONESUR NEGATIVE 03/16/2016 1645   PROTEINUR 100 (A) 03/16/2016 1645   UROBILINOGEN 0.2 09/14/2014  2100   UROBILINOGEN 0.2 08/20/2012 1124   NITRITE NEGATIVE 03/16/2016 1645   LEUKOCYTESUR NEGATIVE 03/16/2016 1645   LEUKOCYTESUR Small 08/20/2012 1124   Sepsis Labs: _0 (procalcitonin:4,lacticidven:4) ) Recent Results (from the past 240 hour(s))  Culture, blood (routine x 2)     Status: None   Collection Time: 03/10/16  9:12 PM  Result Value Ref Range Status   Specimen Description BLOOD LEFT ARM  Final   Special Requests BOTTLES DRAWN AEROBIC AND ANAEROBIC 5CC  Final   Culture   Final    NO GROWTH 5 DAYS Performed at [facility]    Report Status 03/16/2016 FINAL  Final  Culture, blood (routine x 2)     Status: None   Collection Time: 03/10/16  9:18 PM  Result Value Ref Range Status   Specimen Description BLOOD LEFT ARM  Final   Special Requests BOTTLES DRAWN AEROBIC AND ANAEROBIC 5CC  Final   Culture   Final    NO GROWTH 5 DAYS Performed at [facility]    Report Status 03/16/2016 FINAL  Final  Culture, sputum-assessment     Status: None   Collection Time: 03/11/16  4:24 PM  Result Value Ref Range Status   Specimen Description SPUTUM  Final   Special Requests NONE  Final   Sputum evaluation   Final    THIS SPECIMEN IS ACCEPTABLE. RESPIRATORY CULTURE REPORT TO FOLLOW.   Report Status 03/11/2016 FINAL  Final  Culture, respiratory (NON-Expectorated)     Status: None   Collection Time: 03/11/16  4:24 PM  Result Value Ref Range Status   Specimen Description SPUTUM  Final   Special Requests NONE  Final   Gram Stain   Final    ABUNDANT WBC PRESENT,BOTH PMN AND MONONUCLEAR FEW HYPHAE RARE GRAM NEGATIVE RODS RARE GRAM POSITIVE RODS FEW GRAM POSITIVE COCCI    Culture   Final    Consistent with normal respiratory flora. Performed at [facility]    Report Status 03/13/2016 FINAL  Final  Culture, blood (Routine X 2) w Reflex to ID Panel     Status: None (Preliminary result)   Collection Time:  03/16/16  9:40 AM  Result Value Ref Range Status     Specimen Description BLOOD LEFT ANTECUBITAL  Final   Special Requests IN PEDIATRIC BOTTLE 2CC  Final   Culture   Final    NO GROWTH 3 DAYS Performed at [facility]    Report Status PENDING  Incomplete  Culture, blood (Routine X 2) w Reflex to ID Panel     Status: None (Preliminary result)   Collection Time: 03/16/16  9:44 AM  Result Value Ref Range Status   Specimen Description BLOOD RIGHT HAND  Final   Special Requests IN PEDIATRIC BOTTLE .5CC  Final   Culture   Final    NO GROWTH 3 DAYS Performed at [facility]    Report Status PENDING  Incomplete  MRSA PCR Screening     Status: None   Collection Time: 03/19/16  9:33 AM  Result Value Ref Range Status   MRSA by PCR NEGATIVE NEGATIVE Final    Comment:        The GeneXpert MRSA Assay (FDA approved for NASAL specimens only), is one component of a comprehensive MRSA colonization surveillance program. It is not intended to diagnose MRSA infection nor to guide or monitor treatment for MRSA infections.          Radiology Studies: Dg Chest Port 1 View  Result Date: 03/19/2016 CLINICAL DATA:  Shortness of breath/respiratory failure EXAM: PORTABLE CHEST 1 VIEW COMPARISON:  March 18, 2016 FINDINGS: Interstitial edema is stable. There are pleural effusions bilaterally with apparent loculated effusion at the right base. There is a degree of bibasilar atelectasis. A degree of superimposed pneumonia in the left base is questioned. Heart is mildly enlarged with pulmonary venous hypertension. No adenopathy is evident. Bones are osteoporotic. There are old fractures involving multiple ribs on the left. The inferior left scapula, and mid left clavicle. IMPRESSION: Congestive heart failure. Suspect superimposed pneumonia left lower lobe. Multiple prior fractures. Bones osteoporotic. Electronically Signed   By: Lowella Grip III M.D.   On: 03/19/2016 07:07      Scheduled Meds: . acyclovir  400 mg Oral BID  .  atorvastatin  40 mg Oral Daily  . calcium carbonate  1,250 mg Oral Daily  . ceFEPime (MAXIPIME) IV  2 g Intravenous Q8H  . feeding supplement (PRO-STAT SUGAR FREE 64)  30 mL Oral BID  . filgrastim  480 mcg Subcutaneous Q24H  . fluticasone  2 spray Each Nare Daily  . guaiFENesin  1,200 mg Oral BID  . hydrocortisone sod succinate (SOLU-CORTEF) inj  100 mg Intravenous Q8H  . ipratropium-albuterol  3 mL Nebulization BID  . phosphorus  250 mg Oral Daily  . potassium chloride  20 mEq Oral Daily  . vancomycin  750 mg Intravenous Q12H   Continuous Infusions: . sodium chloride 10 mL/hr at 03/19/16 0922     LOS: 10 days    Time spent in minutes: 65    [place], MD [facility] Pager: [email or web address] Password [email or web address] 03/20/2016, 2:01 PM

## 2016-03-20 NOTE — Progress Notes (Signed)
Date:  March 20, 2016 Chart reviewed for concurrent status and case management needs. Will continue to follow the patient for status change: Discharge Planning: following for needs Expected discharge date: [id number] Velva Harman, BSN, [place], [place]

## 2016-03-20 NOTE — Progress Notes (Signed)
PULMONARY / CRITICAL CARE MEDICINE   Name: Shannon Rush MRN: [id number] DOB: 12/31/1940    ADMISSION DATE:  03/10/2016 CONSULTATION DATE:  03/17/2016  REFERRING MD:  Dr. Chucky May  CHIEF COMPLAINT:  Fever  SUBJECTIVE:  Feels better but didn't sleep well Hypertonic saline helped her cough and helped clear her chest a bit more  VITAL SIGNS: BP 117/65   Pulse 80   Temp 97.7 F (36.5 C) (Oral)   Resp (!) 23   Ht _0  (1.676 m)   Wt 56.5 kg (124 lb 9 oz)   SpO2 97%   BMI 20.10 kg/m   INTAKE / OUTPUT: I/O last 3 completed shifts: In: 3055.5 [P.O.:1350; I.V.:955.5; IV Piggyback:750] Out: 1600 [Urine:1600]  PHYSICAL EXAMINATION: General:  No distress in bed Neuro:  Awake, alert, no distress HEENT:  NCAT, OP clear Cardiovascular:  RRR, no mgr Lungs:  CTA B today Abdomen: soft, no masses Musculoskeletal:  Normal bulk, no deformities Skin:  No rash  LABS:  BMET  Recent Labs Lab 03/18/16 0403 03/19/16 0350 03/20/16 0404  NA 134* 134* 137  K 4.3 4.2 4.3  CL 108 109 111  CO2 22 20* 23  BUN 24* 29* 34*  CREATININE 0.68 0.72 0.73  GLUCOSE 163* 139* 124*    Electrolytes  Recent Labs Lab 03/15/16 0450 03/16/16 0523 03/17/16 0537 03/18/16 0403 03/19/16 0350 03/20/16 0404  CALCIUM 7.9* 7.6* 7.2* 7.4* 7.5* 7.4*  MG  --  1.7  --   --   --   --   PHOS 1.8* 2.0* 2.7  --   --   --     CBC  Recent Labs Lab 03/18/16 0403 03/19/16 0350 03/20/16 0404  WBC 0.6* 1.1* 1.6*  HGB 8.2* 8.4* 8.0*  HCT 23.0* 23.7* 22.9*  PLT 14* 20* 19*    Coag's No results for input(s): APTT, INR in the last 168 hours.  Sepsis Markers  Recent Labs Lab 03/17/16 1409 03/17/16 1914 03/19/16 0350  LATICACIDVEN 1.8  --   --   PROCALCITON 1.05 0.95 1.23    ABG No results for input(s): PHART, PCO2ART, PO2ART in the last 168 hours.  Liver Enzymes No results for input(s): AST, ALT, ALKPHOS, BILITOT, ALBUMIN in the last 168 hours.  Cardiac Enzymes No results for  input(s): TROPONINI, PROBNP in the last 168 hours.  Glucose  Recent Labs Lab 03/15/16 0754  GLUCAP 107*    Imaging No results found.   STUDIES:   CULTURES: 8/28 Sputum >> negative 8/28 Urine >> negative 8/28 Blood >> negative 9/02 Blood >> negative  ANTIBIOTICS: 8/27 Vancomycin >> 8/27 Cefepime >> 8/27 Acyclovir >>   SIGNIFICANT EVENTS: 8/27 Admit, oncology consulted 9/03 to ICU with low blood pressure  LINES/TUBES:  CXR over weekend personally reviewed> bilateral airspace disease, kyphosis, small effusions  DISCUSSION: 75 yo female with HCAP in setting of relapsed multiple myeloma on treatment with pomalidomide, dexamethasone, and daratumumab.  Slow improvement.  PMHx of CAD, HLD, Osteoporosis.  ASSESSMENT / PLAN:  PULMONARY A: Acute hypoxic respiratory failure > improving HCAP P:   Oxygen to keep SpO2 > 92% F/u CXR Continue BDs Out of bed Continue mucinex Continue antibiotics Encouraged flutter valve use   INFECTIOUS A:   Febrile neutropenia with HCAP P:   Continue vancomycin, cefepime, acyclovir until [place] improved  ENDOCRINE A:   Relative adrenal insufficiency >> gets decadron for chemotherapy P:   Continue solu cortef  Will transfer to [place] will sign off, call  if questions  Roselie Awkward, MD [facility] Pager: [phone] Cell: [phone] After 3pm or if no response, call [phone]

## 2016-03-20 NOTE — Progress Notes (Signed)
Shannon Rush   DOB:05-Jan-1941   ME#:[id number]   [phone]  Hematology and Oncology follow up note  Subjective: Pt feels better overall, afebrile for the past few days, oxygen requirement has decreased, she is on 3.5 L now, will be transferred to regular floor today. No bleeding. She has been ambulating in the hallway.   Objective:  Vitals:   03/20/16 0700 03/20/16 0800  BP:    Pulse: 80   Resp: (!) 23   Temp:  97.7 F (36.5 C)    Body mass index is 20.1 kg/m.  Intake/Output Summary (Last 24 hours) at 03/20/16 0938 Last data filed at 03/20/16 0700  Gross per 24 hour  Intake          1117.16 ml  Output              675 ml  Net           442.16 ml     Sclerae unicteric  Oropharynx clear  No peripheral adenopathy  Lungs clear -- no rales or rhonchi, decreased breath sound on bilateral lung bases.  Heart regular rate and rhythm  Abdomen benign  MSK no focal spinal tenderness, no peripheral edema  Neuro nonfocal    CBG (last 3)  No results for input(s): GLUCAP in the last 72 hours.   Labs:  Lab Results  Component Value Date   WBC 1.6 (L) 03/20/2016   HGB 8.0 (L) 03/20/2016   HCT 22.9 (L) 03/20/2016   MCV 94.2 03/20/2016   PLT 19 (LL) 03/20/2016   NEUTROABS 0.9 (L) 03/20/2016    CMP Latest Ref Rng & Units 03/20/2016 03/19/2016 03/18/2016  Glucose 65 - 99 mg/dL 124(H) 139(H) 163(H)  BUN 6 - 20 mg/dL 34(H) 29(H) 24(H)  Creatinine 0.44 - 1.00 mg/dL 0.73 0.72 0.68  Sodium 135 - 145 mmol/L 137 134(L) 134(L)  Potassium 3.5 - 5.1 mmol/L 4.3 4.2 4.3  Chloride 101 - 111 mmol/L 111 109 108  CO2 22 - 32 mmol/L 23 20(L) 22  Calcium 8.9 - 10.3 mg/dL 7.4(L) 7.5(L) 7.4(L)  Total Protein 6.5 - 8.1 g/dL - - -  Total Bilirubin 0.3 - 1.2 mg/dL - - -  Alkaline Phos 38 - 126 U/L - - -  AST 15 - 41 U/L - - -  ALT 14 - 54 U/L - - -     Urine Studies No results for input(s): UHGB, CRYS in the last 72 hours.  Invalid input(s): UACOL, UAPR, USPG, UPH, UTP, UGL, UKET, UBIL, UNIT,  UROB, [place], UEPI, UWBC, Duwayne Heck [place], [place]  Basic Metabolic Panel:  Recent Labs Lab 03/15/16 0450 03/16/16 0523 03/17/16 0537 03/18/16 0403 03/19/16 0350 03/20/16 0404  NA 131* 131* 131* 134* 134* 137  K 3.7 3.3* 3.4* 4.3 4.2 4.3  CL 104 101 101 108 109 111  CO2 _0 20* 23  GLUCOSE 110* 127* 111* 163* 139* 124*  BUN _1 24* 29* 34*  CREATININE 0.75 0.66 0.68 0.68 0.72 0.73  CALCIUM 7.9* 7.6* 7.2* 7.4* 7.5* 7.4*  MG  --  1.7  --   --   --   --   PHOS 1.8* 2.0* 2.7  --   --   --    GFR Estimated Creatinine Clearance: 55.9 mL/min (by C-G formula based on SCr of 0.8 mg/dL). Liver Function Tests: No results for input(s): AST, ALT, ALKPHOS, BILITOT, PROT, ALBUMIN in the last 168 hours. No results for input(s): LIPASE, AMYLASE  in the last 168 hours. No results for input(s): AMMONIA in the last 168 hours. Coagulation profile No results for input(s): INR, PROTIME in the last 168 hours.  CBC:  Recent Labs Lab 03/16/16 0523 03/17/16 0537 03/18/16 0403 03/19/16 0350 03/20/16 0404  WBC 0.3* 0.4* 0.6* 1.1* 1.6*  NEUTROABS 0.2* 0.1* 0.3* 0.6* 0.9*  HGB 8.2* 7.3* 8.2* 8.4* 8.0*  HCT 23.1* 20.4* 23.0* 23.7* 22.9*  MCV 98.7 97.1 92.0 92.6 94.2  PLT 34* 23* 14* 20* 19*   Cardiac Enzymes: No results for input(s): CKTOTAL, CKMB, CKMBINDEX, TROPONINI in the last 168 hours. BNP: Invalid input(s): POCBNP CBG:  Recent Labs Lab 03/15/16 0754  GLUCAP 107*   D-Dimer No results for input(s): DDIMER in the last 72 hours. Hgb A1c No results for input(s): HGBA1C in the last 72 hours. Lipid Profile No results for input(s): CHOL, HDL, LDLCALC, TRIG, CHOLHDL, LDLDIRECT in the last 72 hours. Thyroid function studies No results for input(s): TSH, T4TOTAL, T3FREE, THYROIDAB in the last 72 hours.  Invalid input(s): FREET3 Anemia work up No results for input(s): VITAMINB12, FOLATE, FERRITIN, TIBC, IRON, RETICCTPCT in the last 72 hours. Microbiology Recent  Results (from the past 240 hour(s))  Urine culture     Status: None   Collection Time: 03/10/16  9:54 AM  Result Value Ref Range Status   Specimen Description URINE, RANDOM  Final   Special Requests NONE  Final   Culture NO GROWTH Performed at [facility]   Final   Report Status 03/11/2016 FINAL  Final  Culture, blood (routine x 2)     Status: None   Collection Time: 03/10/16  9:12 PM  Result Value Ref Range Status   Specimen Description BLOOD LEFT ARM  Final   Special Requests BOTTLES DRAWN AEROBIC AND ANAEROBIC 5CC  Final   Culture   Final    NO GROWTH 5 DAYS Performed at [facility]    Report Status 03/16/2016 FINAL  Final  Culture, blood (routine x 2)     Status: None   Collection Time: 03/10/16  9:18 PM  Result Value Ref Range Status   Specimen Description BLOOD LEFT ARM  Final   Special Requests BOTTLES DRAWN AEROBIC AND ANAEROBIC 5CC  Final   Culture   Final    NO GROWTH 5 DAYS Performed at [facility]    Report Status 03/16/2016 FINAL  Final  Culture, sputum-assessment     Status: None   Collection Time: 03/11/16  4:24 PM  Result Value Ref Range Status   Specimen Description SPUTUM  Final   Special Requests NONE  Final   Sputum evaluation   Final    THIS SPECIMEN IS ACCEPTABLE. RESPIRATORY CULTURE REPORT TO FOLLOW.   Report Status 03/11/2016 FINAL  Final  Culture, respiratory (NON-Expectorated)     Status: None   Collection Time: 03/11/16  4:24 PM  Result Value Ref Range Status   Specimen Description SPUTUM  Final   Special Requests NONE  Final   Gram Stain   Final    ABUNDANT WBC PRESENT,BOTH PMN AND MONONUCLEAR FEW HYPHAE RARE GRAM NEGATIVE RODS RARE GRAM POSITIVE RODS FEW GRAM POSITIVE COCCI    Culture   Final    Consistent with normal respiratory flora. Performed at [facility]    Report Status 03/13/2016 FINAL  Final  Culture, blood (Routine X 2) w Reflex to ID Panel     Status: None (Preliminary result)   Collection  Time: 03/16/16  9:40 AM  Result Value Ref Range Status   Specimen Description BLOOD LEFT ANTECUBITAL  Final   Special Requests IN PEDIATRIC BOTTLE 2CC  Final   Culture   Final    NO GROWTH 3 DAYS Performed at [facility]    Report Status PENDING  Incomplete  Culture, blood (Routine X 2) w Reflex to ID Panel     Status: None (Preliminary result)   Collection Time: 03/16/16  9:44 AM  Result Value Ref Range Status   Specimen Description BLOOD RIGHT HAND  Final   Special Requests IN PEDIATRIC BOTTLE .5CC  Final   Culture   Final    NO GROWTH 3 DAYS Performed at [facility]    Report Status PENDING  Incomplete  MRSA PCR Screening     Status: None   Collection Time: 03/19/16  9:33 AM  Result Value Ref Range Status   MRSA by PCR NEGATIVE NEGATIVE Final    Comment:        The GeneXpert MRSA Assay (FDA approved for NASAL specimens only), is one component of a comprehensive MRSA colonization surveillance program. It is not intended to diagnose MRSA infection nor to guide or monitor treatment for MRSA infections.       Studies:  Dg Chest Port 1 View  Result Date: 03/19/2016 CLINICAL DATA:  Shortness of breath/respiratory failure EXAM: PORTABLE CHEST 1 VIEW COMPARISON:  March 18, 2016 FINDINGS: Interstitial edema is stable. There are pleural effusions bilaterally with apparent loculated effusion at the right base. There is a degree of bibasilar atelectasis. A degree of superimposed pneumonia in the left base is questioned. Heart is mildly enlarged with pulmonary venous hypertension. No adenopathy is evident. Bones are osteoporotic. There are old fractures involving multiple ribs on the left. The inferior left scapula, and mid left clavicle. IMPRESSION: Congestive heart failure. Suspect superimposed pneumonia left lower lobe. Multiple prior fractures. Bones osteoporotic. Electronically Signed   By: Lowella Grip III M.D.   On: 03/19/2016 07:07    Assessment: 75  y.o. female with history of multiple myeloma status post autotransplant in August 2014, recently had disease relapse, and switched to new chemotherapy treatment with pomalidomide, dexa and daratumumab    1. HCAP with hypoxic respiratory failure, slowly improving  2. Sepsis with hypotension, improved  3. Multiple myeloma, relapsed in July 2017, on new chemo  4. Pancytopenia due to chemotherapy, improving 5. Coronary artery disease, history of STEMI in August 2015  Plan:  -she is on vancomycin and cefepime with gradual improvement  , Requires less oxygen, will be transferred to regular for today -Neutropenia is improving, her ANC 0.6 today, we'll continue Neupogen daily until [place] above 1.5 -plt 19K today, no bleeding, her pneumonia has improved, will hold on plt transfusion today. -follow up daily CBC, consider blood transfusion if Hb<7.5 -continue holding MM treatment  -I will follow up closely.   [name], MD 03/20/2016  9:38 AM

## 2016-03-21 DIAGNOSIS — Z79899 Other long term (current) drug therapy: Secondary | ICD-10-CM

## 2016-03-21 LAB — EXPECTORATED SPUTUM ASSESSMENT W GRAM STAIN, RFLX TO RESP C

## 2016-03-21 LAB — CBC WITH DIFFERENTIAL/PLATELET
BASOS ABS: 0 10*3/uL (ref 0.0–0.1)
Basophils Relative: 1 %
EOS ABS: 0 10*3/uL (ref 0.0–0.7)
Eosinophils Relative: 0 %
HCT: 23.8 % — ABNORMAL LOW (ref 36.0–46.0)
Hemoglobin: 8.1 g/dL — ABNORMAL LOW (ref 12.0–15.0)
Lymphocytes Relative: 35 %
Lymphs Abs: 0.7 10*3/uL (ref 0.7–4.0)
MCH: 33.2 pg (ref 26.0–34.0)
MCHC: 34 g/dL (ref 30.0–36.0)
MCV: 97.5 fL (ref 78.0–100.0)
MONO ABS: 0.1 10*3/uL (ref 0.1–1.0)
Monocytes Relative: 7 %
NEUTROS PCT: 57 %
Neutro Abs: 1.3 10*3/uL — ABNORMAL LOW (ref 1.7–7.7)
PLATELETS: 23 10*3/uL — AB (ref 150–400)
RBC: 2.44 MIL/uL — AB (ref 3.87–5.11)
RDW: 17.6 % — ABNORMAL HIGH (ref 11.5–15.5)
WBC: 2.1 10*3/uL — AB (ref 4.0–10.5)

## 2016-03-21 LAB — EXPECTORATED SPUTUM ASSESSMENT W REFEX TO RESP CULTURE

## 2016-03-21 LAB — CULTURE, BLOOD (ROUTINE X 2)
CULTURE: NO GROWTH
Culture: NO GROWTH

## 2016-03-21 LAB — BASIC METABOLIC PANEL
Anion gap: 3 — ABNORMAL LOW (ref 5–15)
BUN: 41 mg/dL — ABNORMAL HIGH (ref 6–20)
CALCIUM: 7.3 mg/dL — AB (ref 8.9–10.3)
CHLORIDE: 111 mmol/L (ref 101–111)
CO2: 24 mmol/L (ref 22–32)
CREATININE: 0.84 mg/dL (ref 0.44–1.00)
Glucose, Bld: 123 mg/dL — ABNORMAL HIGH (ref 65–99)
Potassium: 4.4 mmol/L (ref 3.5–5.1)
SODIUM: 138 mmol/L (ref 135–145)

## 2016-03-21 MED ORDER — HYDROCORTISONE NA SUCCINATE PF 100 MG IJ SOLR
50.0000 mg | Freq: Three times a day (TID) | INTRAMUSCULAR | Status: DC
  Administered 2016-03-21 – 2016-03-22 (×3): 50 mg via INTRAVENOUS
  Filled 2016-03-21 (×3): qty 2

## 2016-03-21 NOTE — Care Management Important Message (Signed)
Important Message  Patient Details  Name: ELDRED LUFF MRN: [id number] Date of Birth: 01-Mar-1941   Medicare Important Message Given:  Yes    Camillo Flaming 03/21/2016, 10:26 AM

## 2016-03-21 NOTE — Care Management Note (Signed)
Case Management Note  Patient Details  Name: Shannon Rush MRN: [id number] Date of Birth: [birth year]  Subjective/Objective:       75 yo admitted with HCAP. Hx of Multiple Myeloma             Action/Plan: From home with spouse. Per nursing staff pt ambulates independently. Chart reviewed and CM following for DC needs.  Expected Discharge Date:                  Expected Discharge Plan:  Home/Self Care  In-House Referral:     Discharge planning Services  CM Consult  Post Acute Care Choice:    Choice offered to:     DME Arranged:    DME Agency:     HH Arranged:    HH Agency:     Status of Service:  In process, will continue to follow  If discussed at Long Length of Stay Meetings, dates discussed:    Additional CommentsLynnell Catalan, RN 03/21/2016, 12:49 PM  [phone]

## 2016-03-21 NOTE — Progress Notes (Signed)
Pharmacy Antibiotic Note  SHACORIA LATIF is a 75 y.o. female with PMH of multiple myeloma admitted on 03/10/2016 with HCAP.  She continues on day#10 Cefepime.  Vancomycin was restarted on 9/2 per pharmacy dosing due to repeat sepsis workup.   Plan:  Continue Cefepime to 2g IV q8h due to fever, neutropenia.   Continue Vancomycin 726m  IV q12h  Monitor renal function and cx data  Height: 5' 6"  (167.6 cm) Weight: 124 lb 9 oz (56.5 kg) IBW/kg (Calculated) : 59.3  Temp (24hrs), Avg:97.8 F (36.6 C), Min:97.6 F (36.4 C), Max:97.9 F (36.6 C)   Recent Labs Lab 03/17/16 0537 03/17/16 1409 03/18/16 0403 03/18/16 2033 03/19/16 0350 03/20/16 0404 03/21/16 0351  WBC 0.4*  --  0.6*  --  1.1* 1.6* 2.1*  CREATININE 0.68  --  0.68  --  0.72 0.73 0.84  LATICACIDVEN  --  1.8  --   --   --   --   --   VANCOTROUGH  --   --   --  20  --   --   --     Estimated Creatinine Clearance: 53.2 mL/min (by C-G formula based on SCr of 0.84 mg/dL).    Allergies  Allergen Reactions  . Zosyn [Piperacillin Sod-Tazobactam So] Rash    Itching, rash and swelling of extremities  . Piperacillin-Tazobactam In Dex Rash  . Zithromax [Azithromycin]     Full body rash  . Ciprofloxacin Rash    Throwing up and stomach pain     Antimicrobials this admission: PTA Zovirax cont'd 8/27 >> Vancomycin >> 8/31; 9/2>> 8/27 >> Cefepime >>   Dose adjustments this admission: Prev adm: 09/17/14 VT = 10.4 on 5068mq12h, inc to 75054m12h Current adm: 8/29 1142 VT: 10 on 750m17m2h, inc to 1g q12h 9/4 VT = 20 on 1g q12h, reduce back to 750 q12h to avoid further accumulation  Microbiology results: 8/27 BCx: NGF 8/27 UCx: NGF 8/28 Sputum: Normal flora-final 9/2 BCx: ngtd   Thank you for allowing pharmacy to be a part of this patient's care.  ElleDolly Rias 03/21/2016, 12:12 PM Pager [phone]

## 2016-03-21 NOTE — Progress Notes (Signed)
Shannon Rush   DOB:19-Aug-1940   TD#:[id number]   [phone]  Hematology and Oncology follow up note  Subjective: Shannon Rush feels better today, she was transferred to regular floor yesterday, is off oxygen now, and saturating well. WBC and platelet are recovering slowly. No bleeding or fever. Hemodynamic stable, she has been ambulating in the hallway.  Objective:  Vitals:   03/21/16 0556 03/21/16 0902  BP: 113/73   Pulse: 87 88  Resp: 20 20  Temp: 97.9 F (36.6 C)     Body mass index is 20.1 kg/m.  Intake/Output Summary (Last 24 hours) at 03/21/16 0912 Last data filed at 03/20/16 2228  Gross per 24 hour  Intake              590 ml  Output              800 ml  Net             -210 ml     Sclerae unicteric  Oropharynx clear  No peripheral adenopathy  Lungs clear -- no rales or rhonchi, decreased breath sound on bilateral lung bases.  Heart regular rate and rhythm  Abdomen benign  MSK no focal spinal tenderness, no peripheral edema  Neuro nonfocal    CBG (last 3)  No results for input(s): GLUCAP in the last 72 hours.   Labs:  Lab Results  Component Value Date   WBC 2.1 (L) 03/21/2016   HGB 8.1 (L) 03/21/2016   HCT 23.8 (L) 03/21/2016   MCV 97.5 03/21/2016   PLT 23 (LL) 03/21/2016   NEUTROABS 1.3 (L) 03/21/2016    CMP Latest Ref Rng & Units 03/21/2016 03/20/2016 03/19/2016  Glucose 65 - 99 mg/dL 123(H) 124(H) 139(H)  BUN 6 - 20 mg/dL 41(H) 34(H) 29(H)  Creatinine 0.44 - 1.00 mg/dL 0.84 0.73 0.72  Sodium 135 - 145 mmol/L 138 137 134(L)  Potassium 3.5 - 5.1 mmol/L 4.4 4.3 4.2  Chloride 101 - 111 mmol/L 111 111 109  CO2 22 - 32 mmol/L 24 23 20(L)  Calcium 8.9 - 10.3 mg/dL 7.3(L) 7.4(L) 7.5(L)  Total Protein 6.5 - 8.1 g/dL - - -  Total Bilirubin 0.3 - 1.2 mg/dL - - -  Alkaline Phos 38 - 126 U/L - - -  AST 15 - 41 U/L - - -  ALT 14 - 54 U/L - - -     Urine Studies No results for input(s): UHGB, CRYS in the last 72 hours.  Invalid input(s): UACOL, UAPR, USPG,  UPH, UTP, UGL, UKET, UBIL, UNIT, UROB, [place], UEPI, UWBC, Duwayne Heck [place], [place]  Basic Metabolic Panel:  Recent Labs Lab 03/15/16 0450 03/16/16 0523 03/17/16 0537 03/18/16 0403 03/19/16 0350 03/20/16 0404 03/21/16 0351  NA 131* 131* 131* 134* 134* 137 138  K 3.7 3.3* 3.4* 4.3 4.2 4.3 4.4  CL 104 101 101 108 109 111 111  CO2 _0 20* 23 24  GLUCOSE 110* 127* 111* 163* 139* 124* 123*  BUN _1 24* 29* 34* 41*  CREATININE 0.75 0.66 0.68 0.68 0.72 0.73 0.84  CALCIUM 7.9* 7.6* 7.2* 7.4* 7.5* 7.4* 7.3*  MG  --  1.7  --   --   --   --   --   PHOS 1.8* 2.0* 2.7  --   --   --   --    GFR Estimated Creatinine Clearance: 53.2 mL/min (by C-G formula based on SCr of 0.84 mg/dL). Liver Function Tests:  No results for input(s): AST, ALT, ALKPHOS, BILITOT, PROT, ALBUMIN in the last 168 hours. No results for input(s): LIPASE, AMYLASE in the last 168 hours. No results for input(s): AMMONIA in the last 168 hours. Coagulation profile No results for input(s): INR, PROTIME in the last 168 hours.  CBC:  Recent Labs Lab 03/17/16 0537 03/18/16 0403 03/19/16 0350 03/20/16 0404 03/21/16 0351  WBC 0.4* 0.6* 1.1* 1.6* 2.1*  NEUTROABS 0.1* 0.3* 0.6* 0.9* 1.3*  HGB 7.3* 8.2* 8.4* 8.0* 8.1*  HCT 20.4* 23.0* 23.7* 22.9* 23.8*  MCV 97.1 92.0 92.6 94.2 97.5  PLT 23* 14* 20* 19* 23*   Cardiac Enzymes: No results for input(s): CKTOTAL, CKMB, CKMBINDEX, TROPONINI in the last 168 hours. BNP: Invalid input(s): POCBNP CBG:  Recent Labs Lab 03/15/16 0754  GLUCAP 107*   D-Dimer No results for input(s): DDIMER in the last 72 hours. Hgb A1c No results for input(s): HGBA1C in the last 72 hours. Lipid Profile No results for input(s): CHOL, HDL, LDLCALC, TRIG, CHOLHDL, LDLDIRECT in the last 72 hours. Thyroid function studies No results for input(s): TSH, T4TOTAL, T3FREE, THYROIDAB in the last 72 hours.  Invalid input(s): FREET3 Anemia work up No results for input(s):  VITAMINB12, FOLATE, FERRITIN, TIBC, IRON, RETICCTPCT in the last 72 hours. Microbiology Recent Results (from the past 240 hour(s))  Culture, sputum-assessment     Status: None   Collection Time: 03/11/16  4:24 PM  Result Value Ref Range Status   Specimen Description SPUTUM  Final   Special Requests NONE  Final   Sputum evaluation   Final    THIS SPECIMEN IS ACCEPTABLE. RESPIRATORY CULTURE REPORT TO FOLLOW.   Report Status 03/11/2016 FINAL  Final  Culture, respiratory (NON-Expectorated)     Status: None   Collection Time: 03/11/16  4:24 PM  Result Value Ref Range Status   Specimen Description SPUTUM  Final   Special Requests NONE  Final   Gram Stain   Final    ABUNDANT WBC PRESENT,BOTH PMN AND MONONUCLEAR FEW HYPHAE RARE GRAM NEGATIVE RODS RARE GRAM POSITIVE RODS FEW GRAM POSITIVE COCCI    Culture   Final    Consistent with normal respiratory flora. Performed at [facility]    Report Status 03/13/2016 FINAL  Final  Culture, blood (Routine X 2) w Reflex to ID Panel     Status: None (Preliminary result)   Collection Time: 03/16/16  9:40 AM  Result Value Ref Range Status   Specimen Description BLOOD LEFT ANTECUBITAL  Final   Special Requests IN PEDIATRIC BOTTLE 2CC  Final   Culture   Final    NO GROWTH 4 DAYS Performed at [facility]    Report Status PENDING  Incomplete  Culture, blood (Routine X 2) w Reflex to ID Panel     Status: None (Preliminary result)   Collection Time: 03/16/16  9:44 AM  Result Value Ref Range Status   Specimen Description BLOOD RIGHT HAND  Final   Special Requests IN PEDIATRIC BOTTLE .5CC  Final   Culture   Final    NO GROWTH 4 DAYS Performed at [facility]    Report Status PENDING  Incomplete  MRSA PCR Screening     Status: None   Collection Time: 03/19/16  9:33 AM  Result Value Ref Range Status   MRSA by PCR NEGATIVE NEGATIVE Final    Comment:        The GeneXpert MRSA Assay (FDA approved for NASAL specimens only),  is one  component of a comprehensive MRSA colonization surveillance program. It is not intended to diagnose MRSA infection nor to guide or monitor treatment for MRSA infections.     Studies:  No results found.  Assessment: 75 y.o. female with history of multiple myeloma status post autotransplant in August 2014, recently had disease relapse, and switched to new chemotherapy treatment with pomalidomide, dexa and daratumumab    1. HCAP with hypoxic respiratory failure, improving, off oxygen now  2. Sepsis with hypotension, much improved  3. Multiple myeloma, relapsed in July 2017, on new chemo  4. Pancytopenia due to chemotherapy, improving 5. Coronary artery disease, history of STEMI in August 2015  Plan:  -she is on vancomycin and cefepime with significant improvement, off oxygen now -Neutropenia is improving, her ANC 1.3 today, we'll continue Neupogen daily until [place] above 1.5, likely can come off tomorrow -plt 23K today, trending up, we'll continue monitoring. No need platelet transfusion  -follow up daily CBC, consider blood transfusion if Hb<7.5 -continue holding MM treatment  -I will follow up closely.   [name], MD 03/21/2016  9:11 AM

## 2016-03-21 NOTE — Progress Notes (Signed)
PROGRESS NOTE    Shannon Rush  YQI:[id number] DOB: 10-02-1940 DOA: 03/10/2016  PCP: [name], MD   Brief Narrative:  75 year old female with history of multiple myeloma on chemotherapy (pomalidomide, dexamethasone, daratumumab), CAD, hyperlipidemia, osteoporosis, admitted with sepsis secondary to pneumonia, as well with neutropenia secondary to chemotherapy- treated with vancomycin and cefepime- remained septic, hypotensive and therefore transferred to stepdown on 9/3 - Blood pressure improved on stress dose steroids.   Subjective: Continues to feel well. Has been ambulating in the hall without O2 without dyspnea. Coughing is mild. No pain.  Assessment & Plan:   Sepsis ([place]) - Secondary to healthcare associated pneumonia  - On empiric vancomycin and cefepime, stopped vancomycin 8/31, resumed on 9/2 as she became febrile again. - Cultures so far negative. -transferred to stepdown on 9/3 for hypotension- did not need any pressors -pressure improved with stress dose steroids- begin to wean steroids today  HCAP-acute respiratory failure - Left lower lobe-Vancomycin, cefepime, acyclovir -Oxygen weaned off- doing well on room air - resp culture >> normal resp flora  Pancytopenia  - Neutropenia:Secondary to recent chemotherapy. Management per oncology, on IV Granix since admission, changed 9/3 to Neupogen  - Thrombocytopenia: Received 2 unit platelet 9/1,  9/4- continue to follow counts- no obvious bleeding - Anemia, received 1 unit PRBC 9/30 for hemoglobin of 7.3-hemoglobin has subsequently been stable between 8-9  Multiple myeloma ([place]) - Follows with Dr Burr Medico   - being treated with Pomalidomide, Dexamethasone, Daratumumab, Zometa  Coronary atherosclerosis of native coronary artery - Continue Toprol - hold Plavix given thrombocytopenia  Protein-calorie malnutrition ([place]) - Continue with ensure  Hypokalemia/hypophosphatemia Replenished   DVT  prophylaxis: SCDs only due to thrombocytopenia Code Status: Full Family Communication:  Disposition Plan: home in 2-3 days Consultants:   Pulmonary, oncology Procedures:    Antimicrobials:  Anti-infectives    Start     Dose/Rate Route Frequency Ordered Stop   03/18/16 2200  vancomycin (VANCOCIN) IVPB 750 mg/150 ml premix     750 mg 150 mL/hr over 60 Minutes Intravenous Every 12 hours 03/18/16 2134     03/16/16 0900  vancomycin (VANCOCIN) IVPB 1000 mg/200 mL premix  Status:  Discontinued     1,000 mg 200 mL/hr over 60 Minutes Intravenous Every 12 hours 03/16/16 0802 03/18/16 2133   03/12/16 2200  vancomycin (VANCOCIN) IVPB 1000 mg/200 mL premix  Status:  Discontinued     1,000 mg 200 mL/hr over 60 Minutes Intravenous Every 12 hours 03/12/16 1246 03/14/16 0937   03/12/16 1245  vancomycin (VANCOCIN) IVPB 1000 mg/200 mL premix     1,000 mg 200 mL/hr over 60 Minutes Intravenous STAT 03/12/16 1233 03/12/16 1348   03/12/16 1200  ceFEPIme (MAXIPIME) 2 g in dextrose 5 % 50 mL IVPB     2 g 100 mL/hr over 30 Minutes Intravenous Every 8 hours 03/12/16 0843     03/11/16 0000  vancomycin (VANCOCIN) IVPB 750 mg/150 ml premix  Status:  Discontinued     750 mg 150 mL/hr over 60 Minutes Intravenous Every 12 hours 03/10/16 1205 03/12/16 1233   03/10/16 2200  acyclovir (ZOVIRAX) tablet 400 mg     400 mg Oral 2 times daily 03/10/16 1511     03/10/16 2000  ceFEPIme (MAXIPIME) 1 g in dextrose 5 % 50 mL IVPB  Status:  Discontinued     1 g 100 mL/hr over 30 Minutes Intravenous Every 8 hours 03/10/16 1307 03/12/16 0843   03/10/16 1400  aztreonam (AZACTAM) 2 g  in dextrose 5 % 50 mL IVPB  Status:  Discontinued     2 g 100 mL/hr over 30 Minutes Intravenous Every 8 hours 03/10/16 1341 03/10/16 1344   03/10/16 1115  ceFEPIme (MAXIPIME) 2 g in dextrose 5 % 50 mL IVPB     2 g 100 mL/hr over 30 Minutes Intravenous STAT 03/10/16 1100 03/10/16 1306   03/10/16 1100  vancomycin (VANCOCIN) IVPB 750 mg/150 ml  premix     750 mg 150 mL/hr over 60 Minutes Intravenous STAT 03/10/16 1057 03/10/16 1406   03/10/16 1045  aztreonam (AZACTAM) 2 g in dextrose 5 % 50 mL IVPB  Status:  Discontinued     2 g 100 mL/hr over 30 Minutes Intravenous  Once 03/10/16 1042 03/10/16 1100       Objective: Vitals:   03/20/16 1956 03/20/16 2227 03/21/16 0556 03/21/16 0902  BP:  111/78 113/73   Pulse:  88 87 88  Resp:  _0 Temp:  97.7 F (36.5 C) 97.9 F (36.6 C)   TempSrc:  Oral Oral   SpO2: 94% 93% 91% 92%  Weight:      Height:        Intake/Output Summary (Last 24 hours) at 03/21/16 1112 Last data filed at 03/20/16 2228  Gross per 24 hour  Intake              350 ml  Output                0 ml  Net              350 ml   Filed Weights   03/10/16 1447  Weight: 56.5 kg (124 lb 9 oz)    Examination: General exam: Appears comfortable  HEENT: PERRLA, oral mucosa moist, no sclera icterus or thrush Respiratory system: Clear to auscultation. Respiratory effort normal. Cardiovascular system: S1 & S2 heard, RRR.  No murmurs  Gastrointestinal system: Abdomen soft, non-tender, nondistended. Normal bowel sound. No organomegaly Central nervous system: Alert and oriented. No focal neurological deficits. Extremities: No cyanosis, clubbing or edema Skin: No rashes or ulcers Psychiatry:  Mood & affect appropriate.     Data Reviewed: I have personally reviewed following labs and imaging studies  CBC:  Recent Labs Lab 03/17/16 0537 03/18/16 0403 03/19/16 0350 03/20/16 0404 03/21/16 0351  WBC 0.4* 0.6* 1.1* 1.6* 2.1*  NEUTROABS 0.1* 0.3* 0.6* 0.9* 1.3*  HGB 7.3* 8.2* 8.4* 8.0* 8.1*  HCT 20.4* 23.0* 23.7* 22.9* 23.8*  MCV 97.1 92.0 92.6 94.2 97.5  PLT 23* 14* 20* 19* 23*   Basic Metabolic Panel:  Recent Labs Lab 03/15/16 0450 03/16/16 0523 03/17/16 0537 03/18/16 0403 03/19/16 0350 03/20/16 0404 03/21/16 0351  NA 131* 131* 131* 134* 134* 137 138  K 3.7 3.3* 3.4* 4.3 4.2 4.3 4.4  CL  104 101 101 108 109 111 111  CO2 _1 20* 23 24  GLUCOSE 110* 127* 111* 163* 139* 124* 123*  BUN _2 24* 29* 34* 41*  CREATININE 0.75 0.66 0.68 0.68 0.72 0.73 0.84  CALCIUM 7.9* 7.6* 7.2* 7.4* 7.5* 7.4* 7.3*  MG  --  1.7  --   --   --   --   --   PHOS 1.8* 2.0* 2.7  --   --   --   --    GFR: Estimated Creatinine Clearance: 53.2 mL/min (by C-G formula based on SCr of 0.84 mg/dL). Liver Function Tests: No results for  input(s): AST, ALT, ALKPHOS, BILITOT, PROT, ALBUMIN in the last 168 hours. No results for input(s): LIPASE, AMYLASE in the last 168 hours. No results for input(s): AMMONIA in the last 168 hours. Coagulation Profile: No results for input(s): INR, PROTIME in the last 168 hours. Cardiac Enzymes: No results for input(s): CKTOTAL, CKMB, CKMBINDEX, TROPONINI in the last 168 hours. BNP (last 3 results) No results for input(s): PROBNP in the last 8760 hours. HbA1C: No results for input(s): HGBA1C in the last 72 hours. CBG:  Recent Labs Lab 03/15/16 0754  GLUCAP 107*   Lipid Profile: No results for input(s): CHOL, HDL, LDLCALC, TRIG, CHOLHDL, LDLDIRECT in the last 72 hours. Thyroid Function Tests: No results for input(s): TSH, T4TOTAL, FREET4, T3FREE, THYROIDAB in the last 72 hours. Anemia Panel: No results for input(s): VITAMINB12, FOLATE, FERRITIN, TIBC, IRON, RETICCTPCT in the last 72 hours. Urine analysis:    Component Value Date/Time   COLORURINE AMBER (A) 03/16/2016 1645   APPEARANCEUR CLOUDY (A) 03/16/2016 1645   LABSPEC 1.029 03/16/2016 1645   LABSPEC 1.015 08/20/2012 1124   PHURINE 6.0 03/16/2016 1645   GLUCOSEU 100 (A) 03/16/2016 1645   GLUCOSEU Negative 08/20/2012 1124   HGBUR MODERATE (A) 03/16/2016 1645   HGBUR negative 10/22/2007 1336   BILIRUBINUR NEGATIVE 03/16/2016 1645   BILIRUBINUR Negative 08/20/2012 1124   KETONESUR NEGATIVE 03/16/2016 1645   PROTEINUR 100 (A) 03/16/2016 1645   UROBILINOGEN 0.2 09/14/2014 2100   UROBILINOGEN 0.2  08/20/2012 1124   NITRITE NEGATIVE 03/16/2016 1645   LEUKOCYTESUR NEGATIVE 03/16/2016 1645   LEUKOCYTESUR Small 08/20/2012 1124   Sepsis Labs: '@LABRCNTIP'$ (procalcitonin:4,lacticidven:4) ) Recent Results (from the past 240 hour(s))  Culture, sputum-assessment     Status: None   Collection Time: 03/11/16  4:24 PM  Result Value Ref Range Status   Specimen Description SPUTUM  Final   Special Requests NONE  Final   Sputum evaluation   Final    THIS SPECIMEN IS ACCEPTABLE. RESPIRATORY CULTURE REPORT TO FOLLOW.   Report Status 03/11/2016 FINAL  Final  Culture, respiratory (NON-Expectorated)     Status: None   Collection Time: 03/11/16  4:24 PM  Result Value Ref Range Status   Specimen Description SPUTUM  Final   Special Requests NONE  Final   Gram Stain   Final    ABUNDANT WBC PRESENT,BOTH PMN AND MONONUCLEAR FEW HYPHAE RARE GRAM NEGATIVE RODS RARE GRAM POSITIVE RODS FEW GRAM POSITIVE COCCI    Culture   Final    Consistent with normal respiratory flora. Performed at [facility]    Report Status 03/13/2016 FINAL  Final  Culture, blood (Routine X 2) w Reflex to ID Panel     Status: None (Preliminary result)   Collection Time: 03/16/16  9:40 AM  Result Value Ref Range Status   Specimen Description BLOOD LEFT ANTECUBITAL  Final   Special Requests IN PEDIATRIC BOTTLE 2CC  Final   Culture   Final    NO GROWTH 4 DAYS Performed at [facility]    Report Status PENDING  Incomplete  Culture, blood (Routine X 2) w Reflex to ID Panel     Status: None (Preliminary result)   Collection Time: 03/16/16  9:44 AM  Result Value Ref Range Status   Specimen Description BLOOD RIGHT HAND  Final   Special Requests IN PEDIATRIC BOTTLE .5CC  Final   Culture   Final    NO GROWTH 4 DAYS Performed at [facility]    Report Status PENDING  Incomplete  MRSA PCR Screening     Status: None   Collection Time: 03/19/16  9:33 AM  Result Value Ref Range Status   MRSA by PCR NEGATIVE  NEGATIVE Final    Comment:        The GeneXpert MRSA Assay (FDA approved for NASAL specimens only), is one component of a comprehensive MRSA colonization surveillance program. It is not intended to diagnose MRSA infection nor to guide or monitor treatment for MRSA infections.          Radiology Studies: No results found.    Scheduled Meds: . acyclovir  400 mg Oral BID  . atorvastatin  40 mg Oral Daily  . calcium carbonate  1,250 mg Oral Daily  . ceFEPime (MAXIPIME) IV  2 g Intravenous Q8H  . feeding supplement (PRO-STAT SUGAR FREE 64)  30 mL Oral BID  . filgrastim  480 mcg Subcutaneous Q24H  . fluticasone  2 spray Each Nare Daily  . guaiFENesin  1,200 mg Oral BID  . hydrocortisone sod succinate (SOLU-CORTEF) inj  50 mg Intravenous Q8H  . ipratropium-albuterol  3 mL Nebulization BID  . phosphorus  250 mg Oral Daily  . potassium chloride  20 mEq Oral Daily  . vancomycin  750 mg Intravenous Q12H   Continuous Infusions: . sodium chloride 10 mL/hr at 03/19/16 0922     LOS: 11 days    Time spent in minutes: 34    [place], MD [facility] Pager: [email or web address] Password [email or web address] 03/21/2016, 11:12 AM

## 2016-03-22 DIAGNOSIS — D539 Nutritional anemia, unspecified: Secondary | ICD-10-CM

## 2016-03-22 LAB — DIFFERENTIAL
Basophils Absolute: 0 10*3/uL (ref 0.0–0.1)
Basophils Relative: 0 %
EOS ABS: 0 10*3/uL (ref 0.0–0.7)
Eosinophils Relative: 1 %
LYMPHS ABS: 0.8 10*3/uL (ref 0.7–4.0)
LYMPHS PCT: 30 %
Monocytes Absolute: 0.2 10*3/uL (ref 0.1–1.0)
Monocytes Relative: 9 %
NEUTROS ABS: 1.7 10*3/uL (ref 1.7–7.7)
Neutrophils Relative %: 60 %

## 2016-03-22 LAB — CBC
HCT: 23.8 % — ABNORMAL LOW (ref 36.0–46.0)
Hemoglobin: 8.1 g/dL — ABNORMAL LOW (ref 12.0–15.0)
MCH: 33.5 pg (ref 26.0–34.0)
MCHC: 34 g/dL (ref 30.0–36.0)
MCV: 98.3 fL (ref 78.0–100.0)
PLATELETS: 27 10*3/uL — AB (ref 150–400)
RBC: 2.42 MIL/uL — ABNORMAL LOW (ref 3.87–5.11)
RDW: 17.3 % — AB (ref 11.5–15.5)
WBC: 2.7 10*3/uL — ABNORMAL LOW (ref 4.0–10.5)

## 2016-03-22 LAB — BASIC METABOLIC PANEL
Anion gap: 4 — ABNORMAL LOW (ref 5–15)
BUN: 38 mg/dL — AB (ref 6–20)
CALCIUM: 7.4 mg/dL — AB (ref 8.9–10.3)
CHLORIDE: 113 mmol/L — AB (ref 101–111)
CO2: 22 mmol/L (ref 22–32)
CREATININE: 0.71 mg/dL (ref 0.44–1.00)
GFR calc non Af Amer: >60 mL/min (ref >60)
Glucose, Bld: 115 mg/dL — ABNORMAL HIGH (ref 65–99)
Potassium: 4.2 mmol/L (ref 3.5–5.1)
SODIUM: 139 mmol/L (ref 135–145)

## 2016-03-22 MED ORDER — HYDROCORTISONE NA SUCCINATE PF 100 MG IJ SOLR
50.0000 mg | Freq: Two times a day (BID) | INTRAMUSCULAR | Status: DC
  Administered 2016-03-22 – 2016-03-25 (×6): 50 mg via INTRAVENOUS
  Filled 2016-03-22 (×6): qty 2

## 2016-03-22 MED ORDER — CEFUROXIME AXETIL 500 MG PO TABS
500.0000 mg | ORAL_TABLET | Freq: Two times a day (BID) | ORAL | Status: DC
  Administered 2016-03-22 – 2016-03-25 (×6): 500 mg via ORAL
  Filled 2016-03-22 (×6): qty 1

## 2016-03-22 NOTE — Progress Notes (Signed)
PROGRESS NOTE    Shannon Rush  ZDG:[id number] DOB: 08-11-1940 DOA: 03/10/2016  PCP: [name], MD   Brief Narrative:  75 year old female with history of multiple myeloma on chemotherapy (pomalidomide, dexamethasone, daratumumab), CAD, hyperlipidemia, osteoporosis, admitted with sepsis secondary to pneumonia, as well with neutropenia secondary to chemotherapy- treated with vancomycin and cefepime- remained septic, hypotensive and therefore transferred to stepdown on 9/3 - Blood pressure improved on stress dose steroids.   Subjective: Cough is very bad however, she has no dyspnea. Appetite is poor.   Assessment & Plan:   Sepsis ([place]) - Secondary to healthcare associated pneumonia  - has been on empiric vancomycin and cefepime, stopped vancomycin 8/31, resumed on 9/2 as she became febrile again. - Cultures so far negative. -transferred to stepdown on 9/3 for hypotension- did not need any pressors -pressure improved with stress dose steroids- cont wean steroids today   HCAP-acute respiratory failure - Left lower lobe-Vancomycin, cefepime, acyclovir- change antibiotics to Ceftin and Doxycyline- cont oral Acyclovir which she was taking at home -Oxygen weaned off- doing well on room air - resp culture >> normal resp flora  Pancytopenia  - Neutropenia:Secondary to recent chemotherapy. Management per oncology- stopping Neupogen today - Thrombocytopenia: Received 2 unit platelet 9/1,  9/4- continue to follow counts- no obvious bleeding - Anemia, received 1 unit PRBC 9/30 for hemoglobin of 7.3-hemoglobin has subsequently been stable between 8-9  Multiple myeloma ([place]) - Follows with Dr Burr Medico   - being treated with Pomalidomide, Dexamethasone, Daratumumab, Zometa  Coronary atherosclerosis of native coronary artery - Continue Toprol - hold Plavix given thrombocytopenia  Protein-calorie malnutrition ([place]) - Continue with  ensure  Hypokalemia/hypophosphatemia Replenished   DVT prophylaxis: SCDs only due to thrombocytopenia Code Status: Full Family Communication:  Disposition Plan: home tomorrow Consultants:   Pulmonary, oncology Procedures:    Antimicrobials:  Anti-infectives    Start     Dose/Rate Route Frequency Ordered Stop   03/18/16 2200  vancomycin (VANCOCIN) IVPB 750 mg/150 ml premix     750 mg 150 mL/hr over 60 Minutes Intravenous Every 12 hours 03/18/16 2134     03/16/16 0900  vancomycin (VANCOCIN) IVPB 1000 mg/200 mL premix  Status:  Discontinued     1,000 mg 200 mL/hr over 60 Minutes Intravenous Every 12 hours 03/16/16 0802 03/18/16 2133   03/12/16 2200  vancomycin (VANCOCIN) IVPB 1000 mg/200 mL premix  Status:  Discontinued     1,000 mg 200 mL/hr over 60 Minutes Intravenous Every 12 hours 03/12/16 1246 03/14/16 0937   03/12/16 1245  vancomycin (VANCOCIN) IVPB 1000 mg/200 mL premix     1,000 mg 200 mL/hr over 60 Minutes Intravenous STAT 03/12/16 1233 03/12/16 1348   03/12/16 1200  ceFEPIme (MAXIPIME) 2 g in dextrose 5 % 50 mL IVPB     2 g 100 mL/hr over 30 Minutes Intravenous Every 8 hours 03/12/16 0843     03/11/16 0000  vancomycin (VANCOCIN) IVPB 750 mg/150 ml premix  Status:  Discontinued     750 mg 150 mL/hr over 60 Minutes Intravenous Every 12 hours 03/10/16 1205 03/12/16 1233   03/10/16 2200  acyclovir (ZOVIRAX) tablet 400 mg  Status:  Discontinued     400 mg Oral 2 times daily 03/10/16 1511 03/22/16 0727   03/10/16 2000  ceFEPIme (MAXIPIME) 1 g in dextrose 5 % 50 mL IVPB  Status:  Discontinued     1 g 100 mL/hr over 30 Minutes Intravenous Every 8 hours 03/10/16 1307 03/12/16 0843   03/10/16  1400  aztreonam (AZACTAM) 2 g in dextrose 5 % 50 mL IVPB  Status:  Discontinued     2 g 100 mL/hr over 30 Minutes Intravenous Every 8 hours 03/10/16 1341 03/10/16 1344   03/10/16 1115  ceFEPIme (MAXIPIME) 2 g in dextrose 5 % 50 mL IVPB     2 g 100 mL/hr over 30 Minutes Intravenous  STAT 03/10/16 1100 03/10/16 1306   03/10/16 1100  vancomycin (VANCOCIN) IVPB 750 mg/150 ml premix     750 mg 150 mL/hr over 60 Minutes Intravenous STAT 03/10/16 1057 03/10/16 1406   03/10/16 1045  aztreonam (AZACTAM) 2 g in dextrose 5 % 50 mL IVPB  Status:  Discontinued     2 g 100 mL/hr over 30 Minutes Intravenous  Once 03/10/16 1042 03/10/16 1100       Objective: Vitals:   03/21/16 2140 03/22/16 0300 03/22/16 0505 03/22/16 0831  BP: 108/63  106/66   Pulse: 86  86 87  Resp: 16  16 16   Temp: 98.2 F (36.8 C)  98 F (36.7 C)   TempSrc: Oral  Oral   SpO2: 99% 91% 95% 96%  Weight:      Height:        Intake/Output Summary (Last 24 hours) at 03/22/16 1143 Last data filed at 03/22/16 0921  Gross per 24 hour  Intake              420 ml  Output                0 ml  Net              420 ml   Filed Weights   03/10/16 1447  Weight: 56.5 kg (124 lb 9 oz)    Examination: General exam: Appears comfortable  HEENT: PERRLA, oral mucosa moist, no sclera icterus or thrush Respiratory system: Clear to auscultation. Respiratory effort normal. Cardiovascular system: S1 & S2 heard, RRR.  No murmurs  Gastrointestinal system: Abdomen soft, non-tender, nondistended. Normal bowel sound. No organomegaly Central nervous system: Alert and oriented. No focal neurological deficits. Extremities: No cyanosis, clubbing or edema Skin: No rashes or ulcers Psychiatry:  Mood & affect appropriate.     Data Reviewed: I have personally reviewed following labs and imaging studies  CBC:  Recent Labs Lab 03/18/16 0403 03/19/16 0350 03/20/16 0404 03/21/16 0351 03/22/16 0410  WBC 0.6* 1.1* 1.6* 2.1* 2.7*  NEUTROABS 0.3* 0.6* 0.9* 1.3* 1.7  HGB 8.2* 8.4* 8.0* 8.1* 8.1*  HCT 23.0* 23.7* 22.9* 23.8* 23.8*  MCV 92.0 92.6 94.2 97.5 98.3  PLT 14* 20* 19* 23* 27*   Basic Metabolic Panel:  Recent Labs Lab 03/16/16 0523 03/17/16 0537 03/18/16 0403 03/19/16 0350 03/20/16 0404 03/21/16 0351  03/22/16 0410  NA 131* 131* 134* 134* 137 138 139  K 3.3* 3.4* 4.3 4.2 4.3 4.4 4.2  CL 101 101 108 109 111 111 113*  CO2 24 24 22  20* 23 24 22   GLUCOSE 127* 111* 163* 139* 124* 123* 115*  BUN 18 18 24* 29* 34* 41* 38*  CREATININE 0.66 0.68 0.68 0.72 0.73 0.84 0.71  CALCIUM 7.6* 7.2* 7.4* 7.5* 7.4* 7.3* 7.4*  MG 1.7  --   --   --   --   --   --   PHOS 2.0* 2.7  --   --   --   --   --    GFR: Estimated Creatinine Clearance: 55.9 mL/min (by C-G formula based on SCr of  0.8 mg/dL). Liver Function Tests: No results for input(s): AST, ALT, ALKPHOS, BILITOT, PROT, ALBUMIN in the last 168 hours. No results for input(s): LIPASE, AMYLASE in the last 168 hours. No results for input(s): AMMONIA in the last 168 hours. Coagulation Profile: No results for input(s): INR, PROTIME in the last 168 hours. Cardiac Enzymes: No results for input(s): CKTOTAL, CKMB, CKMBINDEX, TROPONINI in the last 168 hours. BNP (last 3 results) No results for input(s): PROBNP in the last 8760 hours. HbA1C: No results for input(s): HGBA1C in the last 72 hours. CBG: No results for input(s): GLUCAP in the last 168 hours. Lipid Profile: No results for input(s): CHOL, HDL, LDLCALC, TRIG, CHOLHDL, LDLDIRECT in the last 72 hours. Thyroid Function Tests: No results for input(s): TSH, T4TOTAL, FREET4, T3FREE, THYROIDAB in the last 72 hours. Anemia Panel: No results for input(s): VITAMINB12, FOLATE, FERRITIN, TIBC, IRON, RETICCTPCT in the last 72 hours. Urine analysis:    Component Value Date/Time   COLORURINE AMBER (A) 03/16/2016 1645   APPEARANCEUR CLOUDY (A) 03/16/2016 1645   LABSPEC 1.029 03/16/2016 1645   LABSPEC 1.015 08/20/2012 1124   PHURINE 6.0 03/16/2016 1645   GLUCOSEU 100 (A) 03/16/2016 1645   GLUCOSEU Negative 08/20/2012 1124   HGBUR MODERATE (A) 03/16/2016 1645   HGBUR negative 10/22/2007 1336   BILIRUBINUR NEGATIVE 03/16/2016 1645   BILIRUBINUR Negative 08/20/2012 1124   KETONESUR NEGATIVE 03/16/2016  1645   PROTEINUR 100 (A) 03/16/2016 1645   UROBILINOGEN 0.2 09/14/2014 2100   UROBILINOGEN 0.2 08/20/2012 1124   NITRITE NEGATIVE 03/16/2016 1645   LEUKOCYTESUR NEGATIVE 03/16/2016 1645   LEUKOCYTESUR Small 08/20/2012 1124   Sepsis Labs: @LABRCNTIP (procalcitonin:4,lacticidven:4) ) Recent Results (from the past 240 hour(s))  Culture, blood (Routine X 2) w Reflex to ID Panel     Status: None   Collection Time: 03/16/16  9:40 AM  Result Value Ref Range Status   Specimen Description BLOOD LEFT ANTECUBITAL  Final   Special Requests IN PEDIATRIC BOTTLE [place]  Final   Culture   Final    NO GROWTH 5 DAYS Performed at [facility]    Report Status 03/21/2016 FINAL  Final  Culture, blood (Routine X 2) w Reflex to ID Panel     Status: None   Collection Time: 03/16/16  9:44 AM  Result Value Ref Range Status   Specimen Description BLOOD RIGHT HAND  Final   Special Requests IN PEDIATRIC BOTTLE .5CC  Final   Culture   Final    NO GROWTH 5 DAYS Performed at [facility]    Report Status 03/21/2016 FINAL  Final  Culture, expectorated sputum-assessment     Status: None   Collection Time: 03/16/16  9:00 PM  Result Value Ref Range Status   Specimen Description SPUTUM  Final   Special Requests NONE  Final   Sputum evaluation   Final    THIS SPECIMEN IS ACCEPTABLE. RESPIRATORY CULTURE REPORT TO FOLLOW.   Report Status 03/21/2016 FINAL  Final  MRSA PCR Screening     Status: None   Collection Time: 03/19/16  9:33 AM  Result Value Ref Range Status   MRSA by PCR NEGATIVE NEGATIVE Final    Comment:        The GeneXpert MRSA Assay (FDA approved for NASAL specimens only), is one component of a comprehensive MRSA colonization surveillance program. It is not intended to diagnose MRSA infection nor to guide or monitor treatment for MRSA infections.          Radiology  Studies: No results found.    Scheduled Meds: . atorvastatin  40 mg Oral Daily  . calcium carbonate   1,250 mg Oral Daily  . ceFEPime (MAXIPIME) IV  2 g Intravenous Q8H  . feeding supplement (PRO-STAT SUGAR FREE 64)  30 mL Oral BID  . filgrastim  480 mcg Subcutaneous Q24H  . fluticasone  2 spray Each Nare Daily  . guaiFENesin  1,200 mg Oral BID  . hydrocortisone sod succinate (SOLU-CORTEF) inj  50 mg Intravenous Q8H  . ipratropium-albuterol  3 mL Nebulization BID  . phosphorus  250 mg Oral Daily  . potassium chloride  20 mEq Oral Daily  . vancomycin  750 mg Intravenous Q12H   Continuous Infusions: . sodium chloride 10 mL/hr at 03/19/16 0922     LOS: 12 days    Time spent in minutes: 52    [place], MD [facility] Pager: [email or web address] Password [facility] 03/22/2016, 11:43 AM

## 2016-03-22 NOTE — Progress Notes (Signed)
PROGRESS NOTE    Shannon Rush  LPF:[id number] DOB: 24-May-1941 DOA: 03/10/2016  PCP: [name], MD   Brief Narrative:  75 year old female with history of multiple myeloma on chemotherapy (pomalidomide, dexamethasone, daratumumab), CAD, hyperlipidemia, osteoporosis, admitted with sepsis secondary to pneumonia, as well with neutropenia secondary to chemotherapy- treated with vancomycin and cefepime- remained septic, hypotensive and therefore transferred to stepdown on 9/3 - Blood pressure improved on stress dose steroids.   Subjective: Cough is very bad however, she has no dyspnea. Appetite is poor.   Assessment & Plan:   Sepsis ([place]) - Secondary to healthcare associated pneumonia  - has been on empiric vancomycin and cefepime, stopped vancomycin 8/31, resumed on 9/2 as she became febrile again. - Cultures so far negative. -transferred to stepdown on 9/3 for hypotension- did not need any pressors -pressure improved with stress dose steroids- cont wean steroids today   HCAP-acute respiratory failure - Left lower lobe-Vancomycin, cefepime, acyclovir- change antibiotics to Ceftin and Doxycyline- cont oral Acyclovir which she was taking at home -Oxygen weaned off- doing well on room air - resp culture >> normal resp flora  Pancytopenia  - Neutropenia:Secondary to recent chemotherapy. Management per oncology- stopping Neupogen today - Thrombocytopenia: Received 2 unit platelet 9/1,  9/4- continue to follow counts- no obvious bleeding - Anemia, received 1 unit PRBC 9/30 for hemoglobin of 7.3-hemoglobin has subsequently been stable between 8-9  Multiple myeloma ([place]) - Follows with Dr Burr Medico   - being treated with Pomalidomide, Dexamethasone, Daratumumab, Zometa  Coronary atherosclerosis of native coronary artery - Continue Toprol - hold Plavix given thrombocytopenia  Protein-calorie malnutrition ([place]) - Continue with  ensure  Hypokalemia/hypophosphatemia Replenished   DVT prophylaxis: SCDs only due to thrombocytopenia Code Status: Full Family Communication:  Disposition Plan: home tomorrow Consultants:   Pulmonary, oncology Procedures:    Antimicrobials:  Anti-infectives    Start     Dose/Rate Route Frequency Ordered Stop   03/22/16 1700  cefUROXime (CEFTIN) tablet 500 mg     500 mg Oral 2 times daily with meals 03/22/16 1148     03/18/16 2200  vancomycin (VANCOCIN) IVPB 750 mg/150 ml premix  Status:  Discontinued     750 mg 150 mL/hr over 60 Minutes Intravenous Every 12 hours 03/18/16 2134 03/22/16 1148   03/16/16 0900  vancomycin (VANCOCIN) IVPB 1000 mg/200 mL premix  Status:  Discontinued     1,000 mg 200 mL/hr over 60 Minutes Intravenous Every 12 hours 03/16/16 0802 03/18/16 2133   03/12/16 2200  vancomycin (VANCOCIN) IVPB 1000 mg/200 mL premix  Status:  Discontinued     1,000 mg 200 mL/hr over 60 Minutes Intravenous Every 12 hours 03/12/16 1246 03/14/16 0937   03/12/16 1245  vancomycin (VANCOCIN) IVPB 1000 mg/200 mL premix     1,000 mg 200 mL/hr over 60 Minutes Intravenous STAT 03/12/16 1233 03/12/16 1348   03/12/16 1200  ceFEPIme (MAXIPIME) 2 g in dextrose 5 % 50 mL IVPB  Status:  Discontinued     2 g 100 mL/hr over 30 Minutes Intravenous Every 8 hours 03/12/16 0843 03/22/16 1148   03/11/16 0000  vancomycin (VANCOCIN) IVPB 750 mg/150 ml premix  Status:  Discontinued     750 mg 150 mL/hr over 60 Minutes Intravenous Every 12 hours 03/10/16 1205 03/12/16 1233   03/10/16 2200  acyclovir (ZOVIRAX) tablet 400 mg  Status:  Discontinued     400 mg Oral 2 times daily 03/10/16 1511 03/22/16 0727   03/10/16 2000  ceFEPIme (MAXIPIME) 1  g in dextrose 5 % 50 mL IVPB  Status:  Discontinued     1 g 100 mL/hr over 30 Minutes Intravenous Every 8 hours 03/10/16 1307 03/12/16 0843   03/10/16 1400  aztreonam (AZACTAM) 2 g in dextrose 5 % 50 mL IVPB  Status:  Discontinued     2 g 100 mL/hr over 30  Minutes Intravenous Every 8 hours 03/10/16 1341 03/10/16 1344   03/10/16 1115  ceFEPIme (MAXIPIME) 2 g in dextrose 5 % 50 mL IVPB     2 g 100 mL/hr over 30 Minutes Intravenous STAT 03/10/16 1100 03/10/16 1306   03/10/16 1100  vancomycin (VANCOCIN) IVPB 750 mg/150 ml premix     750 mg 150 mL/hr over 60 Minutes Intravenous STAT 03/10/16 1057 03/10/16 1406   03/10/16 1045  aztreonam (AZACTAM) 2 g in dextrose 5 % 50 mL IVPB  Status:  Discontinued     2 g 100 mL/hr over 30 Minutes Intravenous  Once 03/10/16 1042 03/10/16 1100       Objective: Vitals:   03/21/16 2140 03/22/16 0300 03/22/16 0505 03/22/16 0831  BP: 108/63  106/66   Pulse: 86  86 87  Resp: 16  16 16   Temp: 98.2 F (36.8 C)  98 F (36.7 C)   TempSrc: Oral  Oral   SpO2: 99% 91% 95% 96%  Weight:      Height:        Intake/Output Summary (Last 24 hours) at 03/22/16 1148 Last data filed at 03/22/16 0921  Gross per 24 hour  Intake              420 ml  Output                0 ml  Net              420 ml   Filed Weights   03/10/16 1447  Weight: 56.5 kg (124 lb 9 oz)    Examination: General exam: Appears comfortable  HEENT: PERRLA, oral mucosa moist, no sclera icterus or thrush Respiratory system: Clear to auscultation. Respiratory effort normal. Cardiovascular system: S1 & S2 heard, RRR.  No murmurs  Gastrointestinal system: Abdomen soft, non-tender, nondistended. Normal bowel sound. No organomegaly Central nervous system: Alert and oriented. No focal neurological deficits. Extremities: No cyanosis, clubbing or edema Skin: No rashes or ulcers Psychiatry:  Mood & affect appropriate.     Data Reviewed: I have personally reviewed following labs and imaging studies  CBC:  Recent Labs Lab 03/18/16 0403 03/19/16 0350 03/20/16 0404 03/21/16 0351 03/22/16 0410  WBC 0.6* 1.1* 1.6* 2.1* 2.7*  NEUTROABS 0.3* 0.6* 0.9* 1.3* 1.7  HGB 8.2* 8.4* 8.0* 8.1* 8.1*  HCT 23.0* 23.7* 22.9* 23.8* 23.8*  MCV 92.0 92.6  94.2 97.5 98.3  PLT 14* 20* 19* 23* 27*   Basic Metabolic Panel:  Recent Labs Lab 03/16/16 0523 03/17/16 0537 03/18/16 0403 03/19/16 0350 03/20/16 0404 03/21/16 0351 03/22/16 0410  NA 131* 131* 134* 134* 137 138 139  K 3.3* 3.4* 4.3 4.2 4.3 4.4 4.2  CL 101 101 108 109 111 111 113*  CO2 24 24 22  20* 23 24 22   GLUCOSE 127* 111* 163* 139* 124* 123* 115*  BUN 18 18 24* 29* 34* 41* 38*  CREATININE 0.66 0.68 0.68 0.72 0.73 0.84 0.71  CALCIUM 7.6* 7.2* 7.4* 7.5* 7.4* 7.3* 7.4*  MG 1.7  --   --   --   --   --   --  PHOS 2.0* 2.7  --   --   --   --   --    GFR: Estimated Creatinine Clearance: 55.9 mL/min (by C-G formula based on SCr of 0.8 mg/dL). Liver Function Tests: No results for input(s): AST, ALT, ALKPHOS, BILITOT, PROT, ALBUMIN in the last 168 hours. No results for input(s): LIPASE, AMYLASE in the last 168 hours. No results for input(s): AMMONIA in the last 168 hours. Coagulation Profile: No results for input(s): INR, PROTIME in the last 168 hours. Cardiac Enzymes: No results for input(s): CKTOTAL, CKMB, CKMBINDEX, TROPONINI in the last 168 hours. BNP (last 3 results) No results for input(s): PROBNP in the last 8760 hours. HbA1C: No results for input(s): HGBA1C in the last 72 hours. CBG: No results for input(s): GLUCAP in the last 168 hours. Lipid Profile: No results for input(s): CHOL, HDL, LDLCALC, TRIG, CHOLHDL, LDLDIRECT in the last 72 hours. Thyroid Function Tests: No results for input(s): TSH, T4TOTAL, FREET4, T3FREE, THYROIDAB in the last 72 hours. Anemia Panel: No results for input(s): VITAMINB12, FOLATE, FERRITIN, TIBC, IRON, RETICCTPCT in the last 72 hours. Urine analysis:    Component Value Date/Time   COLORURINE AMBER (A) 03/16/2016 1645   APPEARANCEUR CLOUDY (A) 03/16/2016 1645   LABSPEC 1.029 03/16/2016 1645   LABSPEC 1.015 08/20/2012 1124   PHURINE 6.0 03/16/2016 1645   GLUCOSEU 100 (A) 03/16/2016 1645   GLUCOSEU Negative 08/20/2012 1124   HGBUR  MODERATE (A) 03/16/2016 1645   HGBUR negative 10/22/2007 1336   BILIRUBINUR NEGATIVE 03/16/2016 1645   BILIRUBINUR Negative 08/20/2012 1124   KETONESUR NEGATIVE 03/16/2016 1645   PROTEINUR 100 (A) 03/16/2016 1645   UROBILINOGEN 0.2 09/14/2014 2100   UROBILINOGEN 0.2 08/20/2012 1124   NITRITE NEGATIVE 03/16/2016 1645   LEUKOCYTESUR NEGATIVE 03/16/2016 1645   LEUKOCYTESUR Small 08/20/2012 1124   Sepsis Labs: @LABRCNTIP (procalcitonin:4,lacticidven:4) ) Recent Results (from the past 240 hour(s))  Culture, blood (Routine X 2) w Reflex to ID Panel     Status: None   Collection Time: 03/16/16  9:40 AM  Result Value Ref Range Status   Specimen Description BLOOD LEFT ANTECUBITAL  Final   Special Requests IN PEDIATRIC BOTTLE [place]  Final   Culture   Final    NO GROWTH 5 DAYS Performed at [facility]    Report Status 03/21/2016 FINAL  Final  Culture, blood (Routine X 2) w Reflex to ID Panel     Status: None   Collection Time: 03/16/16  9:44 AM  Result Value Ref Range Status   Specimen Description BLOOD RIGHT HAND  Final   Special Requests IN PEDIATRIC BOTTLE .5CC  Final   Culture   Final    NO GROWTH 5 DAYS Performed at [facility]    Report Status 03/21/2016 FINAL  Final  Culture, expectorated sputum-assessment     Status: None   Collection Time: 03/16/16  9:00 PM  Result Value Ref Range Status   Specimen Description SPUTUM  Final   Special Requests NONE  Final   Sputum evaluation   Final    THIS SPECIMEN IS ACCEPTABLE. RESPIRATORY CULTURE REPORT TO FOLLOW.   Report Status 03/21/2016 FINAL  Final  MRSA PCR Screening     Status: None   Collection Time: 03/19/16  9:33 AM  Result Value Ref Range Status   MRSA by PCR NEGATIVE NEGATIVE Final    Comment:        The GeneXpert MRSA Assay (FDA approved for NASAL specimens only), is one component of  a comprehensive MRSA colonization surveillance program. It is not intended to diagnose MRSA infection nor to guide  or monitor treatment for MRSA infections.          Radiology Studies: No results found.    Scheduled Meds: . atorvastatin  40 mg Oral Daily  . calcium carbonate  1,250 mg Oral Daily  . cefUROXime  500 mg Oral BID WC  . feeding supplement (PRO-STAT SUGAR FREE 64)  30 mL Oral BID  . filgrastim  480 mcg Subcutaneous Q24H  . fluticasone  2 spray Each Nare Daily  . guaiFENesin  1,200 mg Oral BID  . hydrocortisone sod succinate (SOLU-CORTEF) inj  50 mg Intravenous Q12H  . ipratropium-albuterol  3 mL Nebulization BID  . phosphorus  250 mg Oral Daily  . potassium chloride  20 mEq Oral Daily   Continuous Infusions:     LOS: 12 days    Time spent in minutes: [place], MD [facility] Pager: [email or web address] Password [email or web address] 03/22/2016, 11:48 AM

## 2016-03-23 ENCOUNTER — Inpatient Hospital Stay (HOSPITAL_COMMUNITY): Payer: Medicare Other

## 2016-03-23 LAB — CBC
HCT: 25.6 % — ABNORMAL LOW (ref 36.0–46.0)
Hemoglobin: 8.6 g/dL — ABNORMAL LOW (ref 12.0–15.0)
MCH: 33.5 pg (ref 26.0–34.0)
MCHC: 33.6 g/dL (ref 30.0–36.0)
MCV: 99.6 fL (ref 78.0–100.0)
PLATELETS: 30 10*3/uL — AB (ref 150–400)
RBC: 2.57 MIL/uL — AB (ref 3.87–5.11)
RDW: 17.2 % — AB (ref 11.5–15.5)
WBC: 3.3 10*3/uL — AB (ref 4.0–10.5)

## 2016-03-23 LAB — BASIC METABOLIC PANEL
ANION GAP: 2 — AB (ref 5–15)
BUN: 36 mg/dL — AB (ref 6–20)
CALCIUM: 7.4 mg/dL — AB (ref 8.9–10.3)
CO2: 24 mmol/L (ref 22–32)
Chloride: 114 mmol/L — ABNORMAL HIGH (ref 101–111)
Creatinine, Ser: 0.55 mg/dL (ref 0.44–1.00)
GFR calc Af Amer: >60 mL/min (ref >60)
GLUCOSE: 97 mg/dL (ref 65–99)
POTASSIUM: 3.8 mmol/L (ref 3.5–5.1)
SODIUM: 140 mmol/L (ref 135–145)

## 2016-03-23 MED ORDER — FUROSEMIDE 10 MG/ML IJ SOLN
20.0000 mg | Freq: Two times a day (BID) | INTRAMUSCULAR | Status: DC
  Administered 2016-03-23 – 2016-03-25 (×4): 20 mg via INTRAVENOUS
  Filled 2016-03-23 (×5): qty 2

## 2016-03-23 MED ORDER — FUROSEMIDE 10 MG/ML IJ SOLN: 40.0000 mg | Freq: Two times a day (BID) | INTRAMUSCULAR | Status: DC

## 2016-03-23 MED ORDER — LIP MEDEX EX OINT
TOPICAL_OINTMENT | CUTANEOUS | Status: AC
  Administered 2016-03-23: 13:00:00
  Filled 2016-03-23: qty 7

## 2016-03-23 NOTE — Progress Notes (Signed)
SATURATION QUALIFICATIONS: (This note is used to comply with regulatory documentation for home oxygen)  Patient Saturations on Room Air at Rest = 87  Patient Saturations on Room Air while Ambulating = 82  Patient Saturations on 2 Liters of oxygen while Ambulating = 90  Please briefly explain why patient needs home oxygen:desaturates

## 2016-03-23 NOTE — Progress Notes (Signed)
PROGRESS NOTE    Shannon Rush  PJA:[id number] DOB: 17-Jan-1941 DOA: 03/10/2016  PCP: [name], MD   Brief Narrative:  75 year old female with history of multiple myeloma on chemotherapy (pomalidomide, dexamethasone, daratumumab), CAD, hyperlipidemia, osteoporosis, admitted with sepsis secondary to pneumonia, as well with neutropenia secondary to chemotherapy- treated with vancomycin and cefepime- remained septic, hypotensive and therefore transferred to stepdown on 9/3 - Blood pressure improved on stress dose steroids.   Subjective: Mild to mod cough. Sputum is white.   Assessment & Plan:   Sepsis ([place]) - Secondary to healthcare associated pneumonia  - has been on empiric vancomycin and cefepime, stopped vancomycin 8/31, resumed on 9/2 as she became febrile again. - Cultures so far negative. -transferred to stepdown on 9/3 for hypotension- did not need any pressors -pressure improved with stress dose steroids- cont wean steroids today   HCAP-acute respiratory failure - Left lower lobe-Vancomycin, cefepime, acyclovir- change antibiotics to Ceftin and Doxycyline- cont oral Acyclovir which she was taking at home -Oxygen weaned off- doing well on room air - resp culture >> normal resp flora - hypoxic again today- CXR show pleural effusions- obtain ECHO, start low dose Lasix and follow   Pancytopenia  - Neutropenia:Secondary to recent chemotherapy. Management per oncology- stopping Neupogen today - Thrombocytopenia: Received 2 unit platelet 9/1,  9/4- continue to follow counts- no obvious bleeding - Anemia, received 1 unit PRBC 9/30 for hemoglobin of 7.3-hemoglobin has subsequently been stable between 8-9  Multiple myeloma ([place]) - Follows with Dr Burr Medico   - being treated with Pomalidomide, Dexamethasone, Daratumumab, Zometa  Coronary atherosclerosis of native coronary artery - Continue Toprol - hold Plavix given thrombocytopenia  Protein-calorie malnutrition  ([place]) - Continue with ensure  Hypokalemia/hypophosphatemia Replenished   DVT prophylaxis: SCDs only due to thrombocytopenia Code Status: Full Family Communication:  Disposition Plan: home tomorrow Consultants:   Pulmonary, oncology Procedures:    Antimicrobials:  Anti-infectives    Start     Dose/Rate Route Frequency Ordered Stop   03/22/16 1700  cefUROXime (CEFTIN) tablet 500 mg     500 mg Oral 2 times daily with meals 03/22/16 1148     03/18/16 2200  vancomycin (VANCOCIN) IVPB 750 mg/150 ml premix  Status:  Discontinued     750 mg 150 mL/hr over 60 Minutes Intravenous Every 12 hours 03/18/16 2134 03/22/16 1148   03/16/16 0900  vancomycin (VANCOCIN) IVPB 1000 mg/200 mL premix  Status:  Discontinued     1,000 mg 200 mL/hr over 60 Minutes Intravenous Every 12 hours 03/16/16 0802 03/18/16 2133   03/12/16 2200  vancomycin (VANCOCIN) IVPB 1000 mg/200 mL premix  Status:  Discontinued     1,000 mg 200 mL/hr over 60 Minutes Intravenous Every 12 hours 03/12/16 1246 03/14/16 0937   03/12/16 1245  vancomycin (VANCOCIN) IVPB 1000 mg/200 mL premix     1,000 mg 200 mL/hr over 60 Minutes Intravenous STAT 03/12/16 1233 03/12/16 1348   03/12/16 1200  ceFEPIme (MAXIPIME) 2 g in dextrose 5 % 50 mL IVPB  Status:  Discontinued     2 g 100 mL/hr over 30 Minutes Intravenous Every 8 hours 03/12/16 0843 03/22/16 1148   03/11/16 0000  vancomycin (VANCOCIN) IVPB 750 mg/150 ml premix  Status:  Discontinued     750 mg 150 mL/hr over 60 Minutes Intravenous Every 12 hours 03/10/16 1205 03/12/16 1233   03/10/16 2200  acyclovir (ZOVIRAX) tablet 400 mg  Status:  Discontinued     400 mg Oral 2 times daily  03/10/16 1511 03/22/16 0727   03/10/16 2000  ceFEPIme (MAXIPIME) 1 g in dextrose 5 % 50 mL IVPB  Status:  Discontinued     1 g 100 mL/hr over 30 Minutes Intravenous Every 8 hours 03/10/16 1307 03/12/16 0843   03/10/16 1400  aztreonam (AZACTAM) 2 g in dextrose 5 % 50 mL IVPB  Status:  Discontinued      2 g 100 mL/hr over 30 Minutes Intravenous Every 8 hours 03/10/16 1341 03/10/16 1344   03/10/16 1115  ceFEPIme (MAXIPIME) 2 g in dextrose 5 % 50 mL IVPB     2 g 100 mL/hr over 30 Minutes Intravenous STAT 03/10/16 1100 03/10/16 1306   03/10/16 1100  vancomycin (VANCOCIN) IVPB 750 mg/150 ml premix     750 mg 150 mL/hr over 60 Minutes Intravenous STAT 03/10/16 1057 03/10/16 1406   03/10/16 1045  aztreonam (AZACTAM) 2 g in dextrose 5 % 50 mL IVPB  Status:  Discontinued     2 g 100 mL/hr over 30 Minutes Intravenous  Once 03/10/16 1042 03/10/16 1100       Objective: Vitals:   03/22/16 2058 03/23/16 0459 03/23/16 0951 03/23/16 1318  BP:  117/74  105/66  Pulse:  80  94  Resp:  20  20  Temp:  98 F (36.7 C)  98.1 F (36.7 C)  TempSrc:  Oral  Oral  SpO2: 91% 97% 96% 97%  Weight:      Height:        Intake/Output Summary (Last 24 hours) at 03/23/16 1352 Last data filed at 03/23/16 1319  Gross per 24 hour  Intake              835 ml  Output                0 ml  Net              835 ml   Filed Weights   03/10/16 1447  Weight: 56.5 kg (124 lb 9 oz)    Examination: General exam: Appears comfortable  HEENT: PERRLA, oral mucosa moist, no sclera icterus or thrush Respiratory system: Clear to auscultation. Respiratory effort normal. Cardiovascular system: S1 & S2 heard, RRR.  No murmurs  Gastrointestinal system: Abdomen soft, non-tender, nondistended. Normal bowel sound. No organomegaly Central nervous system: Alert and oriented. No focal neurological deficits. Extremities: No cyanosis, clubbing or edema Skin: No rashes or ulcers Psychiatry:  Mood & affect appropriate.     Data Reviewed: I have personally reviewed following labs and imaging studies  CBC:  Recent Labs Lab 03/18/16 0403 03/19/16 0350 03/20/16 0404 03/21/16 0351 03/22/16 0410 03/23/16 0413  WBC 0.6* 1.1* 1.6* 2.1* 2.7* 3.3*  NEUTROABS 0.3* 0.6* 0.9* 1.3* 1.7  --   HGB 8.2* 8.4* 8.0* 8.1* 8.1* 8.6*  HCT  23.0* 23.7* 22.9* 23.8* 23.8* 25.6*  MCV 92.0 92.6 94.2 97.5 98.3 99.6  PLT 14* 20* 19* 23* 27* 30*   Basic Metabolic Panel:  Recent Labs Lab 03/17/16 0537  03/19/16 0350 03/20/16 0404 03/21/16 0351 03/22/16 0410 03/23/16 0413  NA 131*  < > 134* 137 138 139 140  K 3.4*  < > 4.2 4.3 4.4 4.2 3.8  CL 101  < > 109 111 111 113* 114*  CO2 24  < > 20* 23 24 22 24   GLUCOSE 111*  < > 139* 124* 123* 115* 97  BUN 18  < > 29* 34* 41* 38* 36*  CREATININE 0.68  < >  0.72 0.73 0.84 0.71 0.55  CALCIUM 7.2*  < > 7.5* 7.4* 7.3* 7.4* 7.4*  PHOS 2.7  --   --   --   --   --   --   < > = values in this interval not displayed. GFR: Estimated Creatinine Clearance: 55.9 mL/min (by C-G formula based on SCr of 0.8 mg/dL). Liver Function Tests: No results for input(s): AST, ALT, ALKPHOS, BILITOT, PROT, ALBUMIN in the last 168 hours. No results for input(s): LIPASE, AMYLASE in the last 168 hours. No results for input(s): AMMONIA in the last 168 hours. Coagulation Profile: No results for input(s): INR, PROTIME in the last 168 hours. Cardiac Enzymes: No results for input(s): CKTOTAL, CKMB, CKMBINDEX, TROPONINI in the last 168 hours. BNP (last 3 results) No results for input(s): PROBNP in the last 8760 hours. HbA1C: No results for input(s): HGBA1C in the last 72 hours. CBG: No results for input(s): GLUCAP in the last 168 hours. Lipid Profile: No results for input(s): CHOL, HDL, LDLCALC, TRIG, CHOLHDL, LDLDIRECT in the last 72 hours. Thyroid Function Tests: No results for input(s): TSH, T4TOTAL, FREET4, T3FREE, THYROIDAB in the last 72 hours. Anemia Panel: No results for input(s): VITAMINB12, FOLATE, FERRITIN, TIBC, IRON, RETICCTPCT in the last 72 hours. Urine analysis:    Component Value Date/Time   COLORURINE AMBER (A) 03/16/2016 1645   APPEARANCEUR CLOUDY (A) 03/16/2016 1645   LABSPEC 1.029 03/16/2016 1645   LABSPEC 1.015 08/20/2012 1124   PHURINE 6.0 03/16/2016 1645   GLUCOSEU 100 (A)  03/16/2016 1645   GLUCOSEU Negative 08/20/2012 1124   HGBUR MODERATE (A) 03/16/2016 1645   HGBUR negative 10/22/2007 1336   BILIRUBINUR NEGATIVE 03/16/2016 1645   BILIRUBINUR Negative 08/20/2012 1124   KETONESUR NEGATIVE 03/16/2016 1645   PROTEINUR 100 (A) 03/16/2016 1645   UROBILINOGEN 0.2 09/14/2014 2100   UROBILINOGEN 0.2 08/20/2012 1124   NITRITE NEGATIVE 03/16/2016 1645   LEUKOCYTESUR NEGATIVE 03/16/2016 1645   LEUKOCYTESUR Small 08/20/2012 1124   Sepsis Labs: @LABRCNTIP (procalcitonin:4,lacticidven:4) ) Recent Results (from the past 240 hour(s))  Culture, blood (Routine X 2) w Reflex to ID Panel     Status: None   Collection Time: 03/16/16  9:40 AM  Result Value Ref Range Status   Specimen Description BLOOD LEFT ANTECUBITAL  Final   Special Requests IN PEDIATRIC BOTTLE [place]  Final   Culture   Final    NO GROWTH 5 DAYS Performed at [facility]    Report Status 03/21/2016 FINAL  Final  Culture, blood (Routine X 2) w Reflex to ID Panel     Status: None   Collection Time: 03/16/16  9:44 AM  Result Value Ref Range Status   Specimen Description BLOOD RIGHT HAND  Final   Special Requests IN PEDIATRIC BOTTLE .5CC  Final   Culture   Final    NO GROWTH 5 DAYS Performed at [facility]    Report Status 03/21/2016 FINAL  Final  Culture, expectorated sputum-assessment     Status: None   Collection Time: 03/16/16  9:00 PM  Result Value Ref Range Status   Specimen Description SPUTUM  Final   Special Requests NONE  Final   Sputum evaluation   Final    THIS SPECIMEN IS ACCEPTABLE. RESPIRATORY CULTURE REPORT TO FOLLOW.   Report Status 03/21/2016 FINAL  Final  MRSA PCR Screening     Status: None   Collection Time: 03/19/16  9:33 AM  Result Value Ref Range Status   MRSA by PCR NEGATIVE  NEGATIVE Final    Comment:        The GeneXpert MRSA Assay (FDA approved for NASAL specimens only), is one component of a comprehensive MRSA colonization surveillance program. It  is not intended to diagnose MRSA infection nor to guide or monitor treatment for MRSA infections.   Culture, respiratory (NON-Expectorated)     Status: None (Preliminary result)   Collection Time: 03/21/16  9:43 PM  Result Value Ref Range Status   Specimen Description SPU  Final   Special Requests NONE  Final   Gram Stain   Final    ABUNDANT WBC PRESENT,BOTH PMN AND MONONUCLEAR ABUNDANT GRAM POSITIVE COCCI IN PAIRS IN CHAINS ABUNDANT GRAM VARIABLE ROD ABUNDANT YEAST    Culture PENDING  Incomplete   Report Status PENDING  Incomplete  Culture, blood (x 2)     Status: None (Preliminary result)   Collection Time: 03/22/16 12:36 PM  Result Value Ref Range Status   Specimen Description BLOOD LEFT ARM  Final   Special Requests BOTTLES DRAWN AEROBIC AND ANAEROBIC [facility] EACH  Final   Culture   Final    NO GROWTH < 24 HOURS Performed at [facility]    Report Status PENDING  Incomplete  Culture, blood (x 2)     Status: None (Preliminary result)   Collection Time: 03/22/16 12:44 PM  Result Value Ref Range Status   Specimen Description BLOOD RIGHT ANTECUBITAL  Final   Special Requests BOTTLES DRAWN AEROBIC AND ANAEROBIC 10CC EACH  Final   Culture   Final    NO GROWTH < 24 HOURS Performed at [facility]    Report Status PENDING  Incomplete         Radiology Studies: Dg Chest 2 View  Result Date: 03/23/2016 CLINICAL DATA:  Shortness of breath, cough. EXAM: CHEST  2 VIEW COMPARISON:  Radiograph of March 19, 2016. FINDINGS: Stable cardiomediastinal silhouette. No pneumothorax is noted. Old left rib fractures are noted. Bibasilar opacities are noted concerning for edema or atelectasis with mild associated pleural effusions. Severe compression deformity is seen involving multiple thoracic vertebral bodies most consistent with old fractures. IMPRESSION: Bibasilar edema or atelectasis is noted with mild associated pleural effusions. Electronically Signed   By: Marijo Conception, M.D.   On: 03/23/2016 13:18      Scheduled Meds: . atorvastatin  40 mg Oral Daily  . calcium carbonate  1,250 mg Oral Daily  . cefUROXime  500 mg Oral BID WC  . feeding supplement (PRO-STAT SUGAR FREE 64)  30 mL Oral BID  . fluticasone  2 spray Each Nare Daily  . furosemide  20 mg Intravenous BID  . guaiFENesin  1,200 mg Oral BID  . hydrocortisone sod succinate (SOLU-CORTEF) inj  50 mg Intravenous Q12H  . ipratropium-albuterol  3 mL Nebulization BID  . phosphorus  250 mg Oral Daily  . potassium chloride  20 mEq Oral Daily   Continuous Infusions:     LOS: 13 days    Time spent in minutes: 49    [place], MD [facility] Pager: [email or web address] Password [email or web address] 03/23/2016, 1:52 PM

## 2016-03-24 ENCOUNTER — Encounter (HOSPITAL_COMMUNITY): Payer: Self-pay | Admitting: Radiology

## 2016-03-24 ENCOUNTER — Inpatient Hospital Stay (HOSPITAL_COMMUNITY): Payer: Medicare Other

## 2016-03-24 DIAGNOSIS — I259 Chronic ischemic heart disease, unspecified: Secondary | ICD-10-CM

## 2016-03-24 LAB — BASIC METABOLIC PANEL
Anion gap: 4 — ABNORMAL LOW (ref 5–15)
BUN: 34 mg/dL — AB (ref 6–20)
CO2: 26 mmol/L (ref 22–32)
CREATININE: 0.51 mg/dL (ref 0.44–1.00)
Calcium: 7.4 mg/dL — ABNORMAL LOW (ref 8.9–10.3)
Chloride: 111 mmol/L (ref 101–111)
GFR calc non Af Amer: >60 mL/min (ref >60)
GLUCOSE: 104 mg/dL — AB (ref 65–99)
Potassium: 4.2 mmol/L (ref 3.5–5.1)
Sodium: 141 mmol/L (ref 135–145)

## 2016-03-24 LAB — ECHOCARDIOGRAM COMPLETE
Height: 66 in
WEIGHTICAEL: 2128 [oz_av]

## 2016-03-24 LAB — CBC
HCT: 26 % — ABNORMAL LOW (ref 36.0–46.0)
Hemoglobin: 8.6 g/dL — ABNORMAL LOW (ref 12.0–15.0)
MCH: 33.3 pg (ref 26.0–34.0)
MCHC: 33.1 g/dL (ref 30.0–36.0)
MCV: 100.8 fL — AB (ref 78.0–100.0)
PLATELETS: 39 10*3/uL — AB (ref 150–400)
RBC: 2.58 MIL/uL — AB (ref 3.87–5.11)
RDW: 16.8 % — ABNORMAL HIGH (ref 11.5–15.5)
WBC: 3.6 10*3/uL — ABNORMAL LOW (ref 4.0–10.5)

## 2016-03-24 LAB — CULTURE, RESPIRATORY

## 2016-03-24 LAB — CULTURE, RESPIRATORY W GRAM STAIN: Culture: NORMAL

## 2016-03-24 MED ORDER — DOXYCYCLINE HYCLATE 100 MG PO TABS
100.0000 mg | ORAL_TABLET | Freq: Two times a day (BID) | ORAL | Status: DC
  Administered 2016-03-24 – 2016-03-25 (×3): 100 mg via ORAL
  Filled 2016-03-24 (×3): qty 1

## 2016-03-24 MED ORDER — IOPAMIDOL (ISOVUE-370) INJECTION 76%
100.0000 mL | Freq: Once | INTRAVENOUS | Status: AC | PRN
  Administered 2016-03-24: 100 mL via INTRAVENOUS

## 2016-03-24 NOTE — Progress Notes (Signed)
PROGRESS NOTE    Shannon CUNNING  TDD:[id number] DOB: [birth year] DOA: 03/10/2016  PCP: [name], MD   Brief Narrative:  75 year old female with history of multiple myeloma on chemotherapy (pomalidomide, dexamethasone, daratumumab), CAD, hyperlipidemia, osteoporosis, admitted with sepsis secondary to pneumonia, as well with neutropenia secondary to chemotherapy- treated with vancomycin and cefepime- remained septic, hypotensive and therefore transferred to stepdown on 9/3 - Blood pressure improved on stress dose steroids.   Subjective: Mild to mod cough.  Still becoming short of breath with movement.  Assessment & Plan:   Sepsis ([place]) - Secondary to healthcare associated pneumonia  - has been on empiric vancomycin and cefepime, stopped vancomycin 8/31, resumed on 9/2 as she became febrile again. - Cultures so far negative. -transferred to stepdown on 9/3 for hypotension- did not need any pressors -pressure improved with stress dose steroids- cont wean steroids today   HCAP-acute respiratory failure - pleural effusions - Left lower lobe-Vancomycin, cefepime, acyclovir- changed antibiotics to Ceftin and Doxycyline- cont oral Acyclovir which she was taking at home -Oxygen weaned off- doing well on room air - resp culture >> normal resp flora - hypoxic again on 9/9- CXR show pleural effusions- CT shows these are moderate in size- obtain ECHO -  started low dose Lasix  - cont to keep in negative balance  Pancytopenia  - Neutropenia:Secondary to recent chemotherapy. Management per oncology- stopping Neupogen today - Thrombocytopenia: Received 2 unit platelet 9/1,  9/4- continue to follow counts- no obvious bleeding - Anemia, received 1 unit PRBC 9/30 for hemoglobin of 7.3-hemoglobin has subsequently been stable between 8-9  Multiple myeloma ([place]) - Follows with Dr Burr Medico   - being treated with Pomalidomide, Dexamethasone, Daratumumab, Zometa  Coronary atherosclerosis  of native coronary artery - Continue Toprol - hold Plavix given thrombocytopenia  Protein-calorie malnutrition ([place]) - Continue with ensure  Hypokalemia/hypophosphatemia Replenished   DVT prophylaxis: SCDs only due to thrombocytopenia Code Status: Full Family Communication:  Disposition Plan: home in 2-3 days once hypoxia resolves Consultants:   Pulmonary, oncology Procedures:    Antimicrobials:  Anti-infectives    Start     Dose/Rate Route Frequency Ordered Stop   03/24/16 1100  doxycycline (VIBRA-TABS) tablet 100 mg     100 mg Oral Every 12 hours 03/24/16 1015     03/22/16 1700  cefUROXime (CEFTIN) tablet 500 mg     500 mg Oral 2 times daily with meals 03/22/16 1148     03/18/16 2200  vancomycin (VANCOCIN) IVPB 750 mg/150 ml premix  Status:  Discontinued     750 mg 150 mL/hr over 60 Minutes Intravenous Every 12 hours 03/18/16 2134 03/22/16 1148   03/16/16 0900  vancomycin (VANCOCIN) IVPB 1000 mg/200 mL premix  Status:  Discontinued     1,000 mg 200 mL/hr over 60 Minutes Intravenous Every 12 hours 03/16/16 0802 03/18/16 2133   03/12/16 2200  vancomycin (VANCOCIN) IVPB 1000 mg/200 mL premix  Status:  Discontinued     1,000 mg 200 mL/hr over 60 Minutes Intravenous Every 12 hours 03/12/16 1246 03/14/16 0937   03/12/16 1245  vancomycin (VANCOCIN) IVPB 1000 mg/200 mL premix     1,000 mg 200 mL/hr over 60 Minutes Intravenous STAT 03/12/16 1233 03/12/16 1348   03/12/16 1200  ceFEPIme (MAXIPIME) 2 g in dextrose 5 % 50 mL IVPB  Status:  Discontinued     2 g 100 mL/hr over 30 Minutes Intravenous Every 8 hours 03/12/16 0843 03/22/16 1148   03/11/16 0000  vancomycin (VANCOCIN) IVPB  750 mg/150 ml premix  Status:  Discontinued     750 mg 150 mL/hr over 60 Minutes Intravenous Every 12 hours 03/10/16 1205 03/12/16 1233   03/10/16 2200  acyclovir (ZOVIRAX) tablet 400 mg  Status:  Discontinued     400 mg Oral 2 times daily 03/10/16 1511 03/22/16 0727   03/10/16 2000  ceFEPIme  (MAXIPIME) 1 g in dextrose 5 % 50 mL IVPB  Status:  Discontinued     1 g 100 mL/hr over 30 Minutes Intravenous Every 8 hours 03/10/16 1307 03/12/16 0843   03/10/16 1400  aztreonam (AZACTAM) 2 g in dextrose 5 % 50 mL IVPB  Status:  Discontinued     2 g 100 mL/hr over 30 Minutes Intravenous Every 8 hours 03/10/16 1341 03/10/16 1344   03/10/16 1115  ceFEPIme (MAXIPIME) 2 g in dextrose 5 % 50 mL IVPB     2 g 100 mL/hr over 30 Minutes Intravenous STAT 03/10/16 1100 03/10/16 1306   03/10/16 1100  vancomycin (VANCOCIN) IVPB 750 mg/150 ml premix     750 mg 150 mL/hr over 60 Minutes Intravenous STAT 03/10/16 1057 03/10/16 1406   03/10/16 1045  aztreonam (AZACTAM) 2 g in dextrose 5 % 50 mL IVPB  Status:  Discontinued     2 g 100 mL/hr over 30 Minutes Intravenous  Once 03/10/16 1042 03/10/16 1100       Objective: Vitals:   03/23/16 2053 03/24/16 0500 03/24/16 0517 03/24/16 1103  BP: 106/63  104/67   Pulse: 95  88   Resp: 20  18   Temp: 98.8 F (37.1 C)  97.7 F (36.5 C)   TempSrc: Oral  Oral   SpO2: 92%  100% 92%  Weight:  60.3 kg (133 lb)    Height:        Intake/Output Summary (Last 24 hours) at 03/24/16 1237 Last data filed at 03/24/16 1052  Gross per 24 hour  Intake             1290 ml  Output             3600 ml  Net            -2310 ml   Filed Weights   03/10/16 1447 03/24/16 0500  Weight: 56.5 kg (124 lb 9 oz) 60.3 kg (133 lb)    Examination: General exam: Appears comfortable  HEENT: PERRLA, oral mucosa moist, no sclera icterus or thrush Respiratory system: Clear to auscultation. Respiratory effort normal. Cardiovascular system: S1 & S2 heard, RRR.  No murmurs  Gastrointestinal system: Abdomen soft, non-tender, nondistended. Normal bowel sound. No organomegaly Central nervous system: Alert and oriented. No focal neurological deficits. Extremities: No cyanosis, clubbing or edema Skin: No rashes or ulcers Psychiatry:  Mood & affect appropriate.     Data Reviewed:  I have personally reviewed following labs and imaging studies  CBC:  Recent Labs Lab 03/18/16 0403 03/19/16 0350 03/20/16 0404 03/21/16 0351 03/22/16 0410 03/23/16 0413 03/24/16 0358  WBC 0.6* 1.1* 1.6* 2.1* 2.7* 3.3* 3.6*  NEUTROABS 0.3* 0.6* 0.9* 1.3* 1.7  --   --   HGB 8.2* 8.4* 8.0* 8.1* 8.1* 8.6* 8.6*  HCT 23.0* 23.7* 22.9* 23.8* 23.8* 25.6* 26.0*  MCV 92.0 92.6 94.2 97.5 98.3 99.6 100.8*  PLT 14* 20* 19* 23* 27* 30* 39*   Basic Metabolic Panel:  Recent Labs Lab 03/20/16 0404 03/21/16 0351 03/22/16 0410 03/23/16 0413 03/24/16 0358  NA 137 138 139 140 141  K 4.3 4.4  4.2 3.8 4.2  CL 111 111 113* 114* 111  CO2 _0 GLUCOSE 124* 123* 115* 97 104*  BUN 34* 41* 38* 36* 34*  CREATININE 0.73 0.84 0.71 0.55 0.51  CALCIUM 7.4* 7.3* 7.4* 7.4* 7.4*   GFR: Estimated Creatinine Clearance: 58.6 mL/min (by C-G formula based on SCr of 0.8 mg/dL). Liver Function Tests: No results for input(s): AST, ALT, ALKPHOS, BILITOT, PROT, ALBUMIN in the last 168 hours. No results for input(s): LIPASE, AMYLASE in the last 168 hours. No results for input(s): AMMONIA in the last 168 hours. Coagulation Profile: No results for input(s): INR, PROTIME in the last 168 hours. Cardiac Enzymes: No results for input(s): CKTOTAL, CKMB, CKMBINDEX, TROPONINI in the last 168 hours. BNP (last 3 results) No results for input(s): PROBNP in the last 8760 hours. HbA1C: No results for input(s): HGBA1C in the last 72 hours. CBG: No results for input(s): GLUCAP in the last 168 hours. Lipid Profile: No results for input(s): CHOL, HDL, LDLCALC, TRIG, CHOLHDL, LDLDIRECT in the last 72 hours. Thyroid Function Tests: No results for input(s): TSH, T4TOTAL, FREET4, T3FREE, THYROIDAB in the last 72 hours. Anemia Panel: No results for input(s): VITAMINB12, FOLATE, FERRITIN, TIBC, IRON, RETICCTPCT in the last 72 hours. Urine analysis:    Component Value Date/Time   COLORURINE AMBER (A) 03/16/2016 1645    APPEARANCEUR CLOUDY (A) 03/16/2016 1645   LABSPEC 1.029 03/16/2016 1645   LABSPEC 1.015 08/20/2012 1124   PHURINE 6.0 03/16/2016 1645   GLUCOSEU 100 (A) 03/16/2016 1645   GLUCOSEU Negative 08/20/2012 1124   HGBUR MODERATE (A) 03/16/2016 1645   HGBUR negative 10/22/2007 1336   BILIRUBINUR NEGATIVE 03/16/2016 1645   BILIRUBINUR Negative 08/20/2012 1124   KETONESUR NEGATIVE 03/16/2016 1645   PROTEINUR 100 (A) 03/16/2016 1645   UROBILINOGEN 0.2 09/14/2014 2100   UROBILINOGEN 0.2 08/20/2012 1124   NITRITE NEGATIVE 03/16/2016 1645   LEUKOCYTESUR NEGATIVE 03/16/2016 1645   LEUKOCYTESUR Small 08/20/2012 1124   Sepsis Labs: _1 (procalcitonin:4,lacticidven:4) ) Recent Results (from the past 240 hour(s))  Culture, blood (Routine X 2) w Reflex to ID Panel     Status: None   Collection Time: 03/16/16  9:40 AM  Result Value Ref Range Status   Specimen Description BLOOD LEFT ANTECUBITAL  Final   Special Requests IN PEDIATRIC BOTTLE [place]  Final   Culture   Final    NO GROWTH 5 DAYS Performed at [facility]    Report Status 03/21/2016 FINAL  Final  Culture, blood (Routine X 2) w Reflex to ID Panel     Status: None   Collection Time: 03/16/16  9:44 AM  Result Value Ref Range Status   Specimen Description BLOOD RIGHT HAND  Final   Special Requests IN PEDIATRIC BOTTLE .5CC  Final   Culture   Final    NO GROWTH 5 DAYS Performed at [facility]    Report Status 03/21/2016 FINAL  Final  Culture, expectorated sputum-assessment     Status: None   Collection Time: 03/16/16  9:00 PM  Result Value Ref Range Status   Specimen Description SPUTUM  Final   Special Requests NONE  Final   Sputum evaluation   Final    THIS SPECIMEN IS ACCEPTABLE. RESPIRATORY CULTURE REPORT TO FOLLOW.   Report Status 03/21/2016 FINAL  Final  MRSA PCR Screening     Status: None   Collection Time: 03/19/16  9:33 AM  Result Value Ref Range Status   MRSA by PCR NEGATIVE  NEGATIVE Final     Comment:        The GeneXpert MRSA Assay (FDA approved for NASAL specimens only), is one component of a comprehensive MRSA colonization surveillance program. It is not intended to diagnose MRSA infection nor to guide or monitor treatment for MRSA infections.   Culture, respiratory (NON-Expectorated)     Status: None   Collection Time: 03/21/16  9:43 PM  Result Value Ref Range Status   Specimen Description SPU  Final   Special Requests NONE  Final   Gram Stain   Final    ABUNDANT WBC PRESENT,BOTH PMN AND MONONUCLEAR ABUNDANT GRAM POSITIVE COCCI IN PAIRS IN CHAINS ABUNDANT GRAM VARIABLE ROD ABUNDANT YEAST    Culture   Final    Consistent with normal respiratory flora. Performed at [facility]    Report Status 03/24/2016 FINAL  Final  Culture, blood (x 2)     Status: None (Preliminary result)   Collection Time: 03/22/16 12:36 PM  Result Value Ref Range Status   Specimen Description BLOOD LEFT ARM  Final   Special Requests BOTTLES DRAWN AEROBIC AND ANAEROBIC [place]  Final   Culture   Final    NO GROWTH < 24 HOURS Performed at [facility]    Report Status PENDING  Incomplete  Culture, blood (x 2)     Status: None (Preliminary result)   Collection Time: 03/22/16 12:44 PM  Result Value Ref Range Status   Specimen Description BLOOD RIGHT ANTECUBITAL  Final   Special Requests BOTTLES DRAWN AEROBIC AND ANAEROBIC 10CC EACH  Final   Culture   Final    NO GROWTH < 24 HOURS Performed at [facility]    Report Status PENDING  Incomplete         Radiology Studies: Dg Chest 2 View  Result Date: 03/23/2016 CLINICAL DATA:  Shortness of breath, cough. EXAM: CHEST  2 VIEW COMPARISON:  Radiograph of March 19, 2016. FINDINGS: Stable cardiomediastinal silhouette. No pneumothorax is noted. Old left rib fractures are noted. Bibasilar opacities are noted concerning for edema or atelectasis with mild associated pleural effusions. Severe compression deformity is  seen involving multiple thoracic vertebral bodies most consistent with old fractures. IMPRESSION: Bibasilar edema or atelectasis is noted with mild associated pleural effusions. Electronically Signed   By: Marijo Conception, M.D.   On: 03/23/2016 13:18   Ct Angio Chest Pe W Or Wo Contrast  Result Date: 03/24/2016 CLINICAL DATA:  Hypoxia, history of multiple myeloma. EXAM: CT ANGIOGRAPHY CHEST WITH CONTRAST TECHNIQUE: Multidetector CT imaging of the chest was performed using the standard protocol during bolus administration of intravenous contrast. Multiplanar CT image reconstructions and MIPs were obtained to evaluate the vascular anatomy. CONTRAST:  100 mL of Isovue 370 intravenously. COMPARISON:  CT scan of July 13, 2012. FINDINGS: No pneumothorax is noted. Moderate bilateral pleural effusions are noted. Subsegmental atelectasis of right lower lobe is noted. Large atelectasis of left lower lobe is noted. There is no evidence of pulmonary embolus. There is no evidence of thoracic aortic dissection or aneurysm. Coronary artery calcifications are noted. No mediastinal mass or adenopathy is noted. Sclerotic densities are noted throughout the ribs consistent with old myeloma lesions. Focal lytic area is seen involving right scapula concerning for multiple myeloma. Multiple severe old compression fractures are seen involving the thoracic spine consistent with multiple myeloma. Old sternal fracture is noted. Review of the MIP images confirms the above findings. IMPRESSION: No evidence of pulmonary embolus. Moderate bilateral  pleural effusions are noted with atelectasis of the adjacent lower lobes. Coronary artery calcifications are noted. Sclerotic and lytic bone lesions are seen consistent with history of multiple myeloma. Electronically Signed   By: Marijo Conception, M.D.   On: 03/24/2016 11:56      Scheduled Meds: . atorvastatin  40 mg Oral Daily  . calcium carbonate  1,250 mg Oral Daily  . cefUROXime   500 mg Oral BID WC  . doxycycline  100 mg Oral Q12H  . feeding supplement (PRO-STAT SUGAR FREE 64)  30 mL Oral BID  . fluticasone  2 spray Each Nare Daily  . furosemide  20 mg Intravenous BID  . guaiFENesin  1,200 mg Oral BID  . hydrocortisone sod succinate (SOLU-CORTEF) inj  50 mg Intravenous Q12H  . ipratropium-albuterol  3 mL Nebulization BID  . phosphorus  250 mg Oral Daily  . potassium chloride  20 mEq Oral Daily   Continuous Infusions:     LOS: 14 days    Time spent in minutes: 62    [place], MD [facility] Pager: [email or web address] Password [facility] 03/24/2016, 12:37 PM

## 2016-03-24 NOTE — Progress Notes (Signed)
Echocardiogram 2D Echocardiogram has been performed.  Aggie Cosier 03/24/2016, 9:43 AM

## 2016-03-24 NOTE — Progress Notes (Signed)
SATURATION QUALIFICATIONS: (This note is used to comply with regulatory documentation for home oxygen)  Patient Saturations on Room Air at Rest = *87  Patient Saturations on Room Air while Ambulating =74  Patient Saturations on2iters of oxygen while Ambulating =88  Please briefly explain why patient needs home oxygen:hypoxia

## 2016-03-25 DIAGNOSIS — I429 Cardiomyopathy, unspecified: Secondary | ICD-10-CM

## 2016-03-25 DIAGNOSIS — D61818 Other pancytopenia: Secondary | ICD-10-CM

## 2016-03-25 DIAGNOSIS — I252 Old myocardial infarction: Secondary | ICD-10-CM

## 2016-03-25 LAB — BRAIN NATRIURETIC PEPTIDE: B Natriuretic Peptide: 188.9 pg/mL — ABNORMAL HIGH (ref 0.0–100.0)

## 2016-03-25 LAB — BASIC METABOLIC PANEL
ANION GAP: 5 (ref 5–15)
BUN: 33 mg/dL — AB (ref 6–20)
CHLORIDE: 104 mmol/L (ref 101–111)
CO2: 31 mmol/L (ref 22–32)
Calcium: 7.7 mg/dL — ABNORMAL LOW (ref 8.9–10.3)
Creatinine, Ser: 0.54 mg/dL (ref 0.44–1.00)
GFR calc Af Amer: >60 mL/min (ref >60)
GLUCOSE: 122 mg/dL — AB (ref 65–99)
POTASSIUM: 3.2 mmol/L — AB (ref 3.5–5.1)
Sodium: 140 mmol/L (ref 135–145)

## 2016-03-25 LAB — CBC
HEMATOCRIT: 26.6 % — AB (ref 36.0–46.0)
HEMOGLOBIN: 8.9 g/dL — AB (ref 12.0–15.0)
MCH: 33.6 pg (ref 26.0–34.0)
MCHC: 33.5 g/dL (ref 30.0–36.0)
MCV: 100.4 fL — AB (ref 78.0–100.0)
Platelets: 36 10*3/uL — ABNORMAL LOW (ref 150–400)
RBC: 2.65 MIL/uL — ABNORMAL LOW (ref 3.87–5.11)
RDW: 16.3 % — AB (ref 11.5–15.5)
WBC: 2.8 10*3/uL — ABNORMAL LOW (ref 4.0–10.5)

## 2016-03-25 MED ORDER — METOPROLOL SUCCINATE ER 25 MG PO TB24
25.0000 mg | ORAL_TABLET | Freq: Every day | ORAL | Status: DC
  Administered 2016-03-26 – 2016-03-27 (×2): 25 mg via ORAL
  Filled 2016-03-25 (×4): qty 1

## 2016-03-25 MED ORDER — POTASSIUM CHLORIDE CRYS ER 20 MEQ PO TBCR
40.0000 meq | EXTENDED_RELEASE_TABLET | Freq: Once | ORAL | Status: AC
  Administered 2016-03-25: 40 meq via ORAL
  Filled 2016-03-25: qty 2

## 2016-03-25 MED ORDER — CARVEDILOL 3.125 MG PO TABS: 3.1250 mg | ORAL_TABLET | Freq: Two times a day (BID) | ORAL | Status: DC

## 2016-03-25 MED ORDER — HYDROCORTISONE NA SUCCINATE PF 100 MG IJ SOLR
25.0000 mg | Freq: Two times a day (BID) | INTRAMUSCULAR | Status: DC
  Administered 2016-03-25 – 2016-03-26 (×3): 25 mg via INTRAVENOUS
  Filled 2016-03-25 (×3): qty 2

## 2016-03-25 NOTE — Progress Notes (Signed)
Patient desat to 88% on 2 L [place] while ambulating. Oxygen sats improved to 93% when oxygen increased to 3 L [place] while ambulating. Patient has dyspnea with exertion. Will continue to monitor closely.

## 2016-03-25 NOTE — Consult Note (Signed)
Reason for Consult:   CHF  Requesting Physician: Dr Wynelle Cleveland Primary Cardiologist Dr Johnsie Cancel  HPI:   75y/o female with history of multiple myeloma on chemotherapy. She had a prior echo at [facility] in 2014 that showed her EF to be 60%. She has a history of a STE MI in August 2015 in [place] and was treated with a thrombolytic. Cath showed a 50-60% RCA. It was felt this was a thrombotic event secondary to chemotherapy (Revlimid). Dr Johnsie Cancel has suggested ASA, Plavix, statin and beta blocker. She had been doing pretty well, riding her bike daily a couple of weeks ago. Her chemotherapy has recently been changed by her oncologist.   She was admitted with respiratory failure 03/11/16, she had been treated the previous few days as an OP with ABs. The pt was shocky and required IVF, transfer to ICU, and IV pressors. She was noted to be thrombocytopenic and her Plavix has been stopped. She improved initially but has had persistent hypoxia. Chest CT 03/24/16 showed moderate bilateral effusions. She was given Lasix but this has been stopped secondary to hypotension. I/O negative 2.5L.  Echo done 03/24/16 shows an EF of 45-50% with grade 2 DD. She denies chest pain. Generally feels weak, dyspnea with any activity.   PMHx:  Past Medical History:  Diagnosis Date  . CAD (coronary artery disease)    a. inf STEMI (in [place] >>> [facility]) >>> LHC (8/15- [place]):  pRCA 60%, mid to dist RCA 50% >>> med rx  . Cord compression ([facility]) 07/13/12   MRI- diffuse myeloma involvement of T-L spine  . History of radiation therapy 07/13/12-07/27/12   spinal cord compression T3-T10,left scapula  . Hx of echocardiogram    Echo (03/02/14-done at [facility]):  Normal LVEF without significant regional wall motion abnormalities. Mild   . Multiple myeloma ([place]) 07/01/2012  . OSTEOPOROSIS 06/11/2010   Multiple compression fractures; and spontaneous fracture of sternum Qualifier:  Diagnosis of  By: [name], [name]     . Thoracic kyphosis 07/13/12   per MRI scan  . Unspecified deficiency anemia     Past Surgical History:  Procedure Laterality Date  . APPENDECTOMY    . CESAREAN SECTION     x2   . COLONOSCOPY  2007   neg with Dr. [name]  . ELBOW SURGERY    . HIP SURGERY  2009   left  . TUBAL LIGATION     SOCHx:  reports that she has never smoked. She has never used smokeless tobacco. She reports that she drinks about 3.5 oz of alcohol per week . She reports that she does not use drugs.  FAMHx: Family History  Problem Relation Age of Onset  . Glaucoma Mother   . Heart disease Mother   . Heart disease Father   . Cancer Brother     stage IV prostate CA    ALLERGIES: Allergies  Allergen Reactions  . Zosyn [Piperacillin Sod-Tazobactam So] Rash    Itching, rash and swelling of extremities  . Piperacillin-Tazobactam In Dex Rash  . Zithromax [Azithromycin]     Full body rash  . Ciprofloxacin Rash    Throwing up and stomach pain     ROS: Review of Systems: General: see HPI Cardiovascular: See HPI HEENT: negative for any visual disturbances, blindness, glaucoma Dermatological: negative for rash Respiratory: See HPI- denies orthopnea Urologic: negative for hematuria or dysuria Abdominal: negative for nausea, vomiting, diarrhea,  bright red blood per rectum, melena, or hematemesis Neurologic: negative for visual changes, syncope, or dizziness Musculoskeletal: negative for back pain, joint pain, or swelling Psych: cooperative and appropriate All other systems reviewed and are otherwise negative except as noted above.  MEDS:  . atorvastatin  40 mg Oral Daily  . calcium carbonate  1,250 mg Oral Daily  . feeding supplement (PRO-STAT SUGAR FREE 64)  30 mL Oral BID  . fluticasone  2 spray Each Nare Daily  . guaiFENesin  1,200 mg Oral BID  . hydrocortisone sod succinate (SOLU-CORTEF) inj  25 mg Intravenous Q12H  . metoprolol succinate  25 mg Oral Daily   . potassium chloride  20 mEq Oral Daily   VITALS: Blood pressure 102/62, pulse 84, temperature 98.4 F (36.9 C), temperature source Oral, resp. rate 18, height 5' 6"  (1.676 m), weight 127 lb 14.4 oz (58 kg), SpO2 97 %.  PHYSICAL EXAM: General appearance: alert, cooperative, no distress and frail Neck: no JVD Lungs: kyphosis, decreased breath sounds 1/2 up bilaterally Heart: regular rate and rhythm Abdomen: soft, non-tender; bowel sounds normal; no masses,  no organomegaly and multiple surgical scars Extremities: 1+ pitting LE edema at ankles Pulses: diminnished Skin: pale, cool, dry Neurologic: Grossly normal  LABS: Results for orders placed or performed during the hospital encounter of 03/10/16 (from the past 24 hour(s))  Basic metabolic panel     Status: Abnormal   Collection Time: 03/25/16  8:24 AM  Result Value Ref Range   Sodium 140 135 - 145 mmol/L   Potassium 3.2 (L) 3.5 - 5.1 mmol/L   Chloride 104 101 - 111 mmol/L   CO2 31 22 - 32 mmol/L   Glucose, Bld 122 (H) 65 - 99 mg/dL   BUN 33 (H) 6 - 20 mg/dL   Creatinine, Ser 0.54 0.44 - 1.00 mg/dL   Calcium 7.7 (L) 8.9 - 10.3 mg/dL   GFR calc non Af Amer >60 >60 mL/min   GFR calc Af Amer >60 >60 mL/min   Anion gap 5 5 - 15  CBC     Status: Abnormal   Collection Time: 03/25/16  8:24 AM  Result Value Ref Range   WBC 2.8 (L) 4.0 - 10.5 K/uL   RBC 2.65 (L) 3.87 - 5.11 MIL/uL   Hemoglobin 8.9 (L) 12.0 - 15.0 g/dL   HCT 26.6 (L) 36.0 - 46.0 %   MCV 100.4 (H) 78.0 - 100.0 fL   MCH 33.6 26.0 - 34.0 pg   MCHC 33.5 30.0 - 36.0 g/dL   RDW 16.3 (H) 11.5 - 15.5 %   Platelets 36 (L) 150 - 400 K/uL   EKG: NSR, NSST changes  IMAGING: Dg Chest 2 View  Result Date: 03/23/2016 CLINICAL DATA:  Shortness of breath, cough. EXAM: CHEST  2 VIEW COMPARISON:  Radiograph of March 19, 2016. FINDINGS: Stable cardiomediastinal silhouette. No pneumothorax is noted. Old left rib fractures are noted. Bibasilar opacities are noted concerning for  edema or atelectasis with mild associated pleural effusions. Severe compression deformity is seen involving multiple thoracic vertebral bodies most consistent with old fractures. IMPRESSION: Bibasilar edema or atelectasis is noted with mild associated pleural effusions. Electronically Signed   By: Marijo Conception, M.D.   On: 03/23/2016 13:18   Ct Angio Chest Pe W Or Wo Contrast  Result Date: 03/24/2016 CLINICAL DATA:  Hypoxia, history of multiple myeloma. EXAM: CT ANGIOGRAPHY CHEST WITH CONTRAST TECHNIQUE: Multidetector CT imaging of the chest was performed using the standard protocol during  bolus administration of intravenous contrast. Multiplanar CT image reconstructions and MIPs were obtained to evaluate the vascular anatomy. CONTRAST:  100 mL of Isovue 370 intravenously. COMPARISON:  CT scan of July 13, 2012. FINDINGS: No pneumothorax is noted. Moderate bilateral pleural effusions are noted. Subsegmental atelectasis of right lower lobe is noted. Large atelectasis of left lower lobe is noted. There is no evidence of pulmonary embolus. There is no evidence of thoracic aortic dissection or aneurysm. Coronary artery calcifications are noted. No mediastinal mass or adenopathy is noted. Sclerotic densities are noted throughout the ribs consistent with old myeloma lesions. Focal lytic area is seen involving right scapula concerning for multiple myeloma. Multiple severe old compression fractures are seen involving the thoracic spine consistent with multiple myeloma. Old sternal fracture is noted. Review of the MIP images confirms the above findings. IMPRESSION: No evidence of pulmonary embolus. Moderate bilateral pleural effusions are noted with atelectasis of the adjacent lower lobes. Coronary artery calcifications are noted. Sclerotic and lytic bone lesions are seen consistent with history of multiple myeloma. Electronically Signed   By: Marijo Conception, M.D.   On: 03/24/2016 11:56   Echo 03/24/16 Study  Conclusions  - Left ventricle: The cavity size was normal. Wall thickness was   normal. Systolic function was mildly reduced. The estimated   ejection fraction was in the range of 45% to 50%. Features are   consistent with a pseudonormal left ventricular filling pattern,   with concomitant abnormal relaxation and increased filling   pressure (grade 2 diastolic dysfunction). - Mitral valve: There was mild regurgitation. - Left atrium: The atrium was mildly dilated.   IMPRESSION: Principal Problem:   Acute respiratory failure (HCC) Active Problems:   Multiple myeloma ([place])   Sepsis ([place])   HCAP (healthcare-associated pneumonia)   History of STEMI-Aug 2015- secondary to thrombotic state from chemo   CAD -50-60% RCA Aug 2015   Pancytopenia ([place])   Adrenocortical insufficiency ([place])   Patient on antineoplastic chemotherapy regimen   Cardiomyopathy-drop in EF to 45-50% etiology not yet determined   Protein-calorie malnutrition ([place])  RECOMMENDATION: Admission wgt was 124 lbs- wgt today 127 lbs. Not clear how much of her respiratory failure is from mixed systolic and diastolic CHF. Consider changing Coreg to more selective beta blocker- she was on Toprol 25 mg prior to admission. MD to see today. Check BNP with next lab.   Time Spent Directly with Patient: [id number] minutes  Kerin Ransom, [place] beeper 03/25/2016, 89:22 AM   75 year old female with known multiple myeloma since 2013 treated with Revlimid, recently switched to (pomalidomide, dexamethasone, daratumumab) with pancytopenia, admitted with acute hypoxic respiratory failure sec to pneumonia. She also has a h/o STEMI in August 2015 in [place] and was treated with a thrombolytic. Cath showed a 50-60% RCA. It was felt this was a thrombotic event secondary to chemotherapy (Revlimid). She was on ASA, Plavix, statin and beta blocker.   During this hospital visit she was volume resuscitated for septic shock, then diuresed, now still  hypoxic, pulmonary asking cardiology input for possible CHF. Her LVEF is mildly decreased from previous 55-60% in 2015, now 45-50% possibly secondary to daratumumab.  On physical exam she has decreased breathing sounds in the lung bases and mild B/L pitting edema.  I would continue betablockers and follow her echocardiogram every 3 months if the same chemotherapy is going to be continued. However, this appears to be a prolonged course of pneumonia in a immunosuppressed patient, her BNP  is 180. I would give lasix 20 mg po daily to mobilize the fluid.   Ena Dawley 03/25/2016

## 2016-03-25 NOTE — Progress Notes (Signed)
Shannon CHILDREY   DOB:Jun 12, 1941   OF#:[id number]   [phone]  Hematology and Oncology follow up note  Subjective: Cameka developed hypoxia again over the weekend, CXR and CT showed bilateral pleural effusion, on lasix now, was seen by cardiologist Dr. Meda Coffee and pulmonologist Dr. Vaughan Browner today.   Objective:  Vitals:   03/25/16 1611 03/25/16 2112  BP: 97/65 (!) 99/52  Pulse:  100  Resp:    Temp:  98.1 F (36.7 C)    Body mass index is 20.64 kg/m.  Intake/Output Summary (Last 24 hours) at 03/25/16 2143 Last data filed at 03/25/16 1928  Gross per 24 hour  Intake              660 ml  Output              600 ml  Net               60 ml     Sclerae unicteric  Oropharynx clear  No peripheral adenopathy  Lungs clear -- no rales or rhonchi, decreased breath sound on bilateral lung bases.  Heart regular rate and rhythm  Abdomen benign  MSK no focal spinal tenderness, no peripheral edema  Neuro nonfocal    CBG (last 3)  No results for input(s): GLUCAP in the last 72 hours.   Labs:  Lab Results  Component Value Date   WBC 2.8 (L) 03/25/2016   HGB 8.9 (L) 03/25/2016   HCT 26.6 (L) 03/25/2016   MCV 100.4 (H) 03/25/2016   PLT 36 (L) 03/25/2016   NEUTROABS 1.7 03/22/2016    CMP Latest Ref Rng & Units 03/25/2016 03/24/2016 03/23/2016  Glucose 65 - 99 mg/dL 122(H) 104(H) 97  BUN 6 - 20 mg/dL 33(H) 34(H) 36(H)  Creatinine 0.44 - 1.00 mg/dL 0.54 0.51 0.55  Sodium 135 - 145 mmol/L 140 141 140  Potassium 3.5 - 5.1 mmol/L 3.2(L) 4.2 3.8  Chloride 101 - 111 mmol/L 104 111 114(H)  CO2 22 - 32 mmol/L 31 26 24   Calcium 8.9 - 10.3 mg/dL 7.7(L) 7.4(L) 7.4(L)  Total Protein 6.5 - 8.1 g/dL - - -  Total Bilirubin 0.3 - 1.2 mg/dL - - -  Alkaline Phos 38 - 126 U/L - - -  AST 15 - 41 U/L - - -  ALT 14 - 54 U/L - - -     Urine Studies No results for input(s): UHGB, CRYS in the last 72 hours.  Invalid input(s): UACOL, UAPR, USPG, UPH, UTP, UGL, UKET, UBIL, UNIT, UROB, [place], UEPI,  UWBC, Duwayne Heck [place], [place]  Basic Metabolic Panel:  Recent Labs Lab 03/21/16 0351 03/22/16 0410 03/23/16 0413 03/24/16 0358 03/25/16 0824  NA 138 139 140 141 140  K 4.4 4.2 3.8 4.2 3.2*  CL 111 113* 114* 111 104  CO2 24 22 24 26 31   GLUCOSE 123* 115* 97 104* 122*  BUN 41* 38* 36* 34* 33*  CREATININE 0.84 0.71 0.55 0.51 0.54  CALCIUM 7.3* 7.4* 7.4* 7.4* 7.7*   GFR Estimated Creatinine Clearance: 57.3 mL/min (by C-G formula based on SCr of 0.8 mg/dL). Liver Function Tests: No results for input(s): AST, ALT, ALKPHOS, BILITOT, PROT, ALBUMIN in the last 168 hours. No results for input(s): LIPASE, AMYLASE in the last 168 hours. No results for input(s): AMMONIA in the last 168 hours. Coagulation profile No results for input(s): INR, PROTIME in the last 168 hours.  CBC:  Recent Labs Lab 03/19/16 0350 03/20/16 0404 03/21/16 0351 03/22/16 0410  03/23/16 0413 03/24/16 0358 03/25/16 0824  WBC 1.1* 1.6* 2.1* 2.7* 3.3* 3.6* 2.8*  NEUTROABS 0.6* 0.9* 1.3* 1.7  --   --   --   HGB 8.4* 8.0* 8.1* 8.1* 8.6* 8.6* 8.9*  HCT 23.7* 22.9* 23.8* 23.8* 25.6* 26.0* 26.6*  MCV 92.6 94.2 97.5 98.3 99.6 100.8* 100.4*  PLT 20* 19* 23* 27* 30* 39* 36*   Cardiac Enzymes: No results for input(s): CKTOTAL, CKMB, CKMBINDEX, TROPONINI in the last 168 hours. BNP: Invalid input(s): POCBNP CBG: No results for input(s): GLUCAP in the last 168 hours. D-Dimer No results for input(s): DDIMER in the last 72 hours. Hgb A1c No results for input(s): HGBA1C in the last 72 hours. Lipid Profile No results for input(s): CHOL, HDL, LDLCALC, TRIG, CHOLHDL, LDLDIRECT in the last 72 hours. Thyroid function studies No results for input(s): TSH, T4TOTAL, T3FREE, THYROIDAB in the last 72 hours.  Invalid input(s): FREET3 Anemia work up No results for input(s): VITAMINB12, FOLATE, FERRITIN, TIBC, IRON, RETICCTPCT in the last 72 hours. Microbiology Recent Results (from the past 240 hour(s))  Culture,  blood (Routine X 2) w Reflex to ID Panel     Status: None   Collection Time: 03/16/16  9:40 AM  Result Value Ref Range Status   Specimen Description BLOOD LEFT ANTECUBITAL  Final   Special Requests IN PEDIATRIC BOTTLE [facility]  Final   Culture   Final    NO GROWTH 5 DAYS Performed at [facility]    Report Status 03/21/2016 FINAL  Final  Culture, blood (Routine X 2) w Reflex to ID Panel     Status: None   Collection Time: 03/16/16  9:44 AM  Result Value Ref Range Status   Specimen Description BLOOD RIGHT HAND  Final   Special Requests IN PEDIATRIC BOTTLE .5CC  Final   Culture   Final    NO GROWTH 5 DAYS Performed at [facility]    Report Status 03/21/2016 FINAL  Final  Culture, expectorated sputum-assessment     Status: None   Collection Time: 03/16/16  9:00 PM  Result Value Ref Range Status   Specimen Description SPUTUM  Final   Special Requests NONE  Final   Sputum evaluation   Final    THIS SPECIMEN IS ACCEPTABLE. RESPIRATORY CULTURE REPORT TO FOLLOW.   Report Status 03/21/2016 FINAL  Final  MRSA PCR Screening     Status: None   Collection Time: 03/19/16  9:33 AM  Result Value Ref Range Status   MRSA by PCR NEGATIVE NEGATIVE Final    Comment:        The GeneXpert MRSA Assay (FDA approved for NASAL specimens only), is one component of a comprehensive MRSA colonization surveillance program. It is not intended to diagnose MRSA infection nor to guide or monitor treatment for MRSA infections.   Culture, respiratory (NON-Expectorated)     Status: None   Collection Time: 03/21/16  9:43 PM  Result Value Ref Range Status   Specimen Description SPU  Final   Special Requests NONE  Final   Gram Stain   Final    ABUNDANT WBC PRESENT,BOTH PMN AND MONONUCLEAR ABUNDANT GRAM POSITIVE COCCI IN PAIRS IN CHAINS ABUNDANT GRAM VARIABLE ROD ABUNDANT YEAST    Culture   Final    Consistent with normal respiratory flora. Performed at [facility]    Report Status  03/24/2016 FINAL  Final  Culture, blood (x 2)     Status: None (Preliminary result)   Collection Time:  03/22/16 12:36 PM  Result Value Ref Range Status   Specimen Description BLOOD LEFT ARM  Final   Special Requests BOTTLES DRAWN AEROBIC AND ANAEROBIC [facility] EACH  Final   Culture   Final    NO GROWTH 3 DAYS Performed at [facility]    Report Status PENDING  Incomplete  Culture, blood (x 2)     Status: None (Preliminary result)   Collection Time: 03/22/16 12:44 PM  Result Value Ref Range Status   Specimen Description BLOOD RIGHT ANTECUBITAL  Final   Special Requests BOTTLES DRAWN AEROBIC AND ANAEROBIC 10CC EACH  Final   Culture   Final    NO GROWTH 3 DAYS Performed at [facility]    Report Status PENDING  Incomplete    Studies:  Ct Angio Chest Pe W Or Wo Contrast  Result Date: 03/24/2016 CLINICAL DATA:  Hypoxia, history of multiple myeloma. EXAM: CT ANGIOGRAPHY CHEST WITH CONTRAST TECHNIQUE: Multidetector CT imaging of the chest was performed using the standard protocol during bolus administration of intravenous contrast. Multiplanar CT image reconstructions and MIPs were obtained to evaluate the vascular anatomy. CONTRAST:  100 mL of Isovue 370 intravenously. COMPARISON:  CT scan of July 13, 2012. FINDINGS: No pneumothorax is noted. Moderate bilateral pleural effusions are noted. Subsegmental atelectasis of right lower lobe is noted. Large atelectasis of left lower lobe is noted. There is no evidence of pulmonary embolus. There is no evidence of thoracic aortic dissection or aneurysm. Coronary artery calcifications are noted. No mediastinal mass or adenopathy is noted. Sclerotic densities are noted throughout the ribs consistent with old myeloma lesions. Focal lytic area is seen involving right scapula concerning for multiple myeloma. Multiple severe old compression fractures are seen involving the thoracic spine consistent with multiple myeloma. Old sternal fracture is  noted. Review of the MIP images confirms the above findings. IMPRESSION: No evidence of pulmonary embolus. Moderate bilateral pleural effusions are noted with atelectasis of the adjacent lower lobes. Coronary artery calcifications are noted. Sclerotic and lytic bone lesions are seen consistent with history of multiple myeloma. Electronically Signed   By: Marijo Conception, M.D.   On: 03/24/2016 11:56    Assessment: 75 y.o. female with history of multiple myeloma status post autotransplant in August 2014, recently had disease relapse, and switched to new chemotherapy treatment with pomalidomide, dexa and daratumumab    1. HCAP with hypoxic respiratory failure, improved, completed antibiotics course  2. Hypoxia, b/l pleural effusion, possible related to CHF and IVF during hospital stay  2. Sepsis with hypotension, resolved  3. Multiple myeloma, relapsed in July 2017, on new chemo, on hold now  4. Pancytopenia due to chemotherapy, improving 5. Coronary artery disease, history of STEMI in August 2015, CHF with EF 45-50% on 03/24/16  Plan:  -she is on lasix for pleural effusion and hypoxia, slowly improving  -I think her CHF exacerbation is likely related to fluid resuscitation when she was hypotension from sepsis, less likely from daratumumab (not reported in clinical trials data) -Her neutropenia has resolved, thrombocytopenia slowly improving  -we discussed that her pneumonia is the complication from her recent MM treatment, which caused severe cytopenia and immunocompromises. I have contacted and updated her hematologist Dr. [name] at [facility] today, and she recommends to continue DPd with dose reduction on pomalyst. But I think her marrow tolerance is very low, and will recommend her to try low dose Pomalyst 1-44m alone for cycle 2, when she recovers well.  -I also tried to  call her daughter Evelena Peat.  I will continue follow up.    [name], MD 03/25/2016  9:43 PM

## 2016-03-25 NOTE — Progress Notes (Signed)
PROGRESS NOTE    Shannon Rush  ZOX:[id number] DOB: 08-28-1940 DOA: 03/10/2016  PCP: [name], MD   Brief Narrative:  75 year old female with history of multiple myeloma on chemotherapy (pomalidomide, dexamethasone, daratumumab), CAD, hyperlipidemia, osteoporosis, admitted with sepsis secondary to pneumonia, as well with neutropenia secondary to chemotherapy- treated with vancomycin and cefepime- remained septic, hypotensive and therefore transferred to stepdown on 9/3 - Blood pressure improved on stress dose steroids.   Subjective: Mild to mod cough unchagned.  Still becoming short of breath with movement.  Assessment & Plan:   Sepsis ([place]) - Secondary to healthcare associated pneumonia  - has been on empiric vancomycin and cefepime, stopped vancomycin 8/31, resumed on 9/2 as she became febrile again. - Cultures so far negative. -transferred to stepdown on 9/3 for hypotension- did not need any pressors -pressure improved with stress dose steroids- cont wean steroids to off by tomorrow   HCAP-acute respiratory failure - pleural effusions - Left lower lobe-Vancomycin, cefepime, acyclovir- changed antibiotics to Ceftin and Doxycyline- cont oral Acyclovir which she was taking at home- - resp culture >> normal resp flora -will d/c antibiotics today -Oxygen weaned off- doing well on room air on 9/7 - hypoxic again on 9/9- CXR show pleural effusions- CT shows these are moderate in size- no PE and no consolidation noted - obtained ECHO with shows EF of 45-50 % and Grade 2 diastolic dysfunction which is new -  started low dose Lasix however continues to be hypoxic- due to positive orthostatic vitals, I have held Lasix today and am consulting cardiology and Pulmonary  Acute systolic and diastolic CHF - will need to start Coreg and ACE I- will start with low dose Coreg  Pancytopenia  - due to chemo -  Neutropenia: Secondary to recent chemotherapy. Management per oncology-  stopped Neupogen  - Thrombocytopenia: Received 2 unit platelet 9/1,  9/4- continue to follow counts- no obvious bleeding -  Anemia: received 1 unit PRBC 9/30 for hemoglobin of 7.3-hemoglobin has subsequently been stable between 8-9  Multiple myeloma ([place]) - Follows with Dr Burr Medico   - being treated with Pomalidomide, Dexamethasone, Daratumumab, Zometa  Coronary atherosclerosis of native coronary artery - Continue Toprol - hold Plavix given thrombocytopenia  Protein-calorie malnutrition ([place]) - Continue with ensure  Hypokalemia/hypophosphatemia Replenished   DVT prophylaxis: SCDs only due to thrombocytopenia Code Status: Full Family Communication:  Disposition Plan: home in 2-3 days once hypoxia resolves Consultants:   Pulmonary, oncology Procedures:    Antimicrobials:  Anti-infectives    Start     Dose/Rate Route Frequency Ordered Stop   03/24/16 1100  doxycycline (VIBRA-TABS) tablet 100 mg     100 mg Oral Every 12 hours 03/24/16 1015     03/22/16 1700  cefUROXime (CEFTIN) tablet 500 mg     500 mg Oral 2 times daily with meals 03/22/16 1148     03/18/16 2200  vancomycin (VANCOCIN) IVPB 750 mg/150 ml premix  Status:  Discontinued     750 mg 150 mL/hr over 60 Minutes Intravenous Every 12 hours 03/18/16 2134 03/22/16 1148   03/16/16 0900  vancomycin (VANCOCIN) IVPB 1000 mg/200 mL premix  Status:  Discontinued     1,000 mg 200 mL/hr over 60 Minutes Intravenous Every 12 hours 03/16/16 0802 03/18/16 2133   03/12/16 2200  vancomycin (VANCOCIN) IVPB 1000 mg/200 mL premix  Status:  Discontinued     1,000 mg 200 mL/hr over 60 Minutes Intravenous Every 12 hours 03/12/16 1246 03/14/16 0937   03/12/16 1245  vancomycin (VANCOCIN) IVPB 1000 mg/200 mL premix     1,000 mg 200 mL/hr over 60 Minutes Intravenous STAT 03/12/16 1233 03/12/16 1348   03/12/16 1200  ceFEPIme (MAXIPIME) 2 g in dextrose 5 % 50 mL IVPB  Status:  Discontinued     2 g 100 mL/hr over 30 Minutes Intravenous Every  8 hours 03/12/16 0843 03/22/16 1148   03/11/16 0000  vancomycin (VANCOCIN) IVPB 750 mg/150 ml premix  Status:  Discontinued     750 mg 150 mL/hr over 60 Minutes Intravenous Every 12 hours 03/10/16 1205 03/12/16 1233   03/10/16 2200  acyclovir (ZOVIRAX) tablet 400 mg  Status:  Discontinued     400 mg Oral 2 times daily 03/10/16 1511 03/22/16 0727   03/10/16 2000  ceFEPIme (MAXIPIME) 1 g in dextrose 5 % 50 mL IVPB  Status:  Discontinued     1 g 100 mL/hr over 30 Minutes Intravenous Every 8 hours 03/10/16 1307 03/12/16 0843   03/10/16 1400  aztreonam (AZACTAM) 2 g in dextrose 5 % 50 mL IVPB  Status:  Discontinued     2 g 100 mL/hr over 30 Minutes Intravenous Every 8 hours 03/10/16 1341 03/10/16 1344   03/10/16 1115  ceFEPIme (MAXIPIME) 2 g in dextrose 5 % 50 mL IVPB     2 g 100 mL/hr over 30 Minutes Intravenous STAT 03/10/16 1100 03/10/16 1306   03/10/16 1100  vancomycin (VANCOCIN) IVPB 750 mg/150 ml premix     750 mg 150 mL/hr over 60 Minutes Intravenous STAT 03/10/16 1057 03/10/16 1406   03/10/16 1045  aztreonam (AZACTAM) 2 g in dextrose 5 % 50 mL IVPB  Status:  Discontinued     2 g 100 mL/hr over 30 Minutes Intravenous  Once 03/10/16 1042 03/10/16 1100       Objective: Vitals:   03/24/16 2010 03/25/16 0540 03/25/16 0600 03/25/16 0932  BP: 106/63 102/62    Pulse: 88 84    Resp: 16 18    Temp: 97.8 F (36.6 C) 98.4 F (36.9 C)    TempSrc: Oral Oral    SpO2: 93% 93%  97%  Weight:   58 kg (127 lb 14.4 oz)   Height:        Intake/Output Summary (Last 24 hours) at 03/25/16 1045 Last data filed at 03/25/16 0700  Gross per 24 hour  Intake              660 ml  Output             1100 ml  Net             -440 ml   Filed Weights   03/10/16 1447 03/24/16 0500 03/25/16 0600  Weight: 56.5 kg (124 lb 9 oz) 60.3 kg (133 lb) 58 kg (127 lb 14.4 oz)    Examination: General exam: Appears comfortable  HEENT: PERRLA, oral mucosa moist, no sclera icterus or thrush Respiratory system:  Clear to auscultation. Respiratory effort normal. Cardiovascular system: S1 & S2 heard, RRR.  No murmurs  Gastrointestinal system: Abdomen soft, non-tender, nondistended. Normal bowel sound. No organomegaly Central nervous system: Alert and oriented. No focal neurological deficits. Extremities: No cyanosis, clubbing or edema Skin: No rashes or ulcers Psychiatry:  Mood & affect appropriate.     Data Reviewed: I have personally reviewed following labs and imaging studies  CBC:  Recent Labs Lab 03/19/16 0350 03/20/16 0404 03/21/16 0351 03/22/16 0410 03/23/16 0413 03/24/16 0358 03/25/16 0824  WBC 1.1* 1.6* 2.1*  2.7* 3.3* 3.6* 2.8*  NEUTROABS 0.6* 0.9* 1.3* 1.7  --   --   --   HGB 8.4* 8.0* 8.1* 8.1* 8.6* 8.6* 8.9*  HCT 23.7* 22.9* 23.8* 23.8* 25.6* 26.0* 26.6*  MCV 92.6 94.2 97.5 98.3 99.6 100.8* 100.4*  PLT 20* 19* 23* 27* 30* 39* 36*   Basic Metabolic Panel:  Recent Labs Lab 03/21/16 0351 03/22/16 0410 03/23/16 0413 03/24/16 0358 03/25/16 0824  NA 138 139 140 141 140  K 4.4 4.2 3.8 4.2 3.2*  CL 111 113* 114* 111 104  CO2 24 22 24 26 31  GLUCOSE 123* 115* 97 104* 122*  BUN 41* 38* 36* 34* 33*  CREATININE 0.84 0.71 0.55 0.51 0.54  CALCIUM 7.3* 7.4* 7.4* 7.4* 7.7*   GFR: Estimated Creatinine Clearance: 57.3 mL/min (by C-G formula based on SCr of 0.8 mg/dL). Liver Function Tests: No results for input(s): AST, ALT, ALKPHOS, BILITOT, PROT, ALBUMIN in the last 168 hours. No results for input(s): LIPASE, AMYLASE in the last 168 hours. No results for input(s): AMMONIA in the last 168 hours. Coagulation Profile: No results for input(s): INR, PROTIME in the last 168 hours. Cardiac Enzymes: No results for input(s): CKTOTAL, CKMB, CKMBINDEX, TROPONINI in the last 168 hours. BNP (last 3 results) No results for input(s): PROBNP in the last 8760 hours. HbA1C: No results for input(s): HGBA1C in the last 72 hours. CBG: No results for input(s): GLUCAP in the last 168  hours. Lipid Profile: No results for input(s): CHOL, HDL, LDLCALC, TRIG, CHOLHDL, LDLDIRECT in the last 72 hours. Thyroid Function Tests: No results for input(s): TSH, T4TOTAL, FREET4, T3FREE, THYROIDAB in the last 72 hours. Anemia Panel: No results for input(s): VITAMINB12, FOLATE, FERRITIN, TIBC, IRON, RETICCTPCT in the last 72 hours. Urine analysis:    Component Value Date/Time   COLORURINE AMBER (A) 03/16/2016 1645   APPEARANCEUR CLOUDY (A) 03/16/2016 1645   LABSPEC 1.029 03/16/2016 1645   LABSPEC 1.015 08/20/2012 1124   PHURINE 6.0 03/16/2016 1645   GLUCOSEU 100 (A) 03/16/2016 1645   GLUCOSEU Negative 08/20/2012 1124   HGBUR MODERATE (A) 03/16/2016 1645   HGBUR negative 10/22/2007 1336   BILIRUBINUR NEGATIVE 03/16/2016 1645   BILIRUBINUR Negative 08/20/2012 1124   KETONESUR NEGATIVE 03/16/2016 1645   PROTEINUR 100 (A) 03/16/2016 1645   UROBILINOGEN 0.2 09/14/2014 2100   UROBILINOGEN 0.2 08/20/2012 1124   NITRITE NEGATIVE 03/16/2016 1645   LEUKOCYTESUR NEGATIVE 03/16/2016 1645   LEUKOCYTESUR Small 08/20/2012 1124   Sepsis Labs: @LABRCNTIP(procalcitonin:4,lacticidven:4) ) Recent Results (from the past 240 hour(s))  Culture, blood (Routine X 2) w Reflex to ID Panel     Status: None   Collection Time: 03/16/16  9:40 AM  Result Value Ref Range Status   Specimen Description BLOOD LEFT ANTECUBITAL  Final   Special Requests IN PEDIATRIC BOTTLE 2CC  Final   Culture   Final    NO GROWTH 5 DAYS Performed at [facility]    Report Status 03/21/2016 FINAL  Final  Culture, blood (Routine X 2) w Reflex to ID Panel     Status: None   Collection Time: 03/16/16  9:44 AM  Result Value Ref Range Status   Specimen Description BLOOD RIGHT HAND  Final   Special Requests IN PEDIATRIC BOTTLE .5CC  Final   Culture   Final    NO GROWTH 5 DAYS Performed at [facility]    Report Status 03/21/2016 FINAL  Final  Culture, expectorated sputum-assessment     Status: None      Collection Time: 03/16/16  9:00 PM  Result Value Ref Range Status   Specimen Description SPUTUM  Final   Special Requests NONE  Final   Sputum evaluation   Final    THIS SPECIMEN IS ACCEPTABLE. RESPIRATORY CULTURE REPORT TO FOLLOW.   Report Status 03/21/2016 FINAL  Final  MRSA PCR Screening     Status: None   Collection Time: 03/19/16  9:33 AM  Result Value Ref Range Status   MRSA by PCR NEGATIVE NEGATIVE Final    Comment:        The GeneXpert MRSA Assay (FDA approved for NASAL specimens only), is one component of a comprehensive MRSA colonization surveillance program. It is not intended to diagnose MRSA infection nor to guide or monitor treatment for MRSA infections.   Culture, respiratory (NON-Expectorated)     Status: None   Collection Time: 03/21/16  9:43 PM  Result Value Ref Range Status   Specimen Description SPU  Final   Special Requests NONE  Final   Gram Stain   Final    ABUNDANT WBC PRESENT,BOTH PMN AND MONONUCLEAR ABUNDANT GRAM POSITIVE COCCI IN PAIRS IN CHAINS ABUNDANT GRAM VARIABLE ROD ABUNDANT YEAST    Culture   Final    Consistent with normal respiratory flora. Performed at [facility]    Report Status 03/24/2016 FINAL  Final  Culture, blood (x 2)     Status: None (Preliminary result)   Collection Time: 03/22/16 12:36 PM  Result Value Ref Range Status   Specimen Description BLOOD LEFT ARM  Final   Special Requests BOTTLES DRAWN AEROBIC AND ANAEROBIC 6CC EACH  Final   Culture   Final    NO GROWTH 2 DAYS Performed at [facility]    Report Status PENDING  Incomplete  Culture, blood (x 2)     Status: None (Preliminary result)   Collection Time: 03/22/16 12:44 PM  Result Value Ref Range Status   Specimen Description BLOOD RIGHT ANTECUBITAL  Final   Special Requests BOTTLES DRAWN AEROBIC AND ANAEROBIC 10CC EACH  Final   Culture   Final    NO GROWTH 2 DAYS Performed at [facility]    Report Status PENDING  Incomplete          Radiology Studies: Dg Chest 2 View  Result Date: 03/23/2016 CLINICAL DATA:  Shortness of breath, cough. EXAM: CHEST  2 VIEW COMPARISON:  Radiograph of March 19, 2016. FINDINGS: Stable cardiomediastinal silhouette. No pneumothorax is noted. Old left rib fractures are noted. Bibasilar opacities are noted concerning for edema or atelectasis with mild associated pleural effusions. Severe compression deformity is seen involving multiple thoracic vertebral bodies most consistent with old fractures. IMPRESSION: Bibasilar edema or atelectasis is noted with mild associated pleural effusions. Electronically Signed   By: James  Green Jr, M.D.   On: 03/23/2016 13:18   Ct Angio Chest Pe W Or Wo Contrast  Result Date: 03/24/2016 CLINICAL DATA:  Hypoxia, history of multiple myeloma. EXAM: CT ANGIOGRAPHY CHEST WITH CONTRAST TECHNIQUE: Multidetector CT imaging of the chest was performed using the standard protocol during bolus administration of intravenous contrast. Multiplanar CT image reconstructions and MIPs were obtained to evaluate the vascular anatomy. CONTRAST:  100 mL of Isovue 370 intravenously. COMPARISON:  CT scan of July 13, 2012. FINDINGS: No pneumothorax is noted. Moderate bilateral pleural effusions are noted. Subsegmental atelectasis of right lower lobe is noted. Large atelectasis of left lower lobe is noted. There is no evidence of pulmonary embolus. There is no   evidence of thoracic aortic dissection or aneurysm. Coronary artery calcifications are noted. No mediastinal mass or adenopathy is noted. Sclerotic densities are noted throughout the ribs consistent with old myeloma lesions. Focal lytic area is seen involving right scapula concerning for multiple myeloma. Multiple severe old compression fractures are seen involving the thoracic spine consistent with multiple myeloma. Old sternal fracture is noted. Review of the MIP images confirms the above findings. IMPRESSION: No evidence of  pulmonary embolus. Moderate bilateral pleural effusions are noted with atelectasis of the adjacent lower lobes. Coronary artery calcifications are noted. Sclerotic and lytic bone lesions are seen consistent with history of multiple myeloma. Electronically Signed   By: Marijo Conception, M.D.   On: 03/24/2016 11:56      Scheduled Meds: . atorvastatin  40 mg Oral Daily  . calcium carbonate  1,250 mg Oral Daily  . cefUROXime  500 mg Oral BID WC  . doxycycline  100 mg Oral Q12H  . feeding supplement (PRO-STAT SUGAR FREE 64)  30 mL Oral BID  . fluticasone  2 spray Each Nare Daily  . guaiFENesin  1,200 mg Oral BID  . hydrocortisone sod succinate (SOLU-CORTEF) inj  50 mg Intravenous Q12H  . ipratropium-albuterol  3 mL Nebulization BID  . phosphorus  250 mg Oral Daily  . potassium chloride  20 mEq Oral Daily  . potassium chloride  40 mEq Oral Once   Continuous Infusions:     LOS: 15 days    Time spent in minutes: [place], MD [facility] Pager: [email or web address] Password [email or web address] 03/25/2016, 10:45 AM

## 2016-03-25 NOTE — Progress Notes (Signed)
PULMONARY / CRITICAL CARE MEDICINE   Name: Shannon Rush MRN: [id number] DOB: 1941/02/21    ADMISSION DATE:  03/10/2016 CONSULTATION DATE:  03/17/2016  REFERRING MD:  Dr. Chucky May  CHIEF COMPLAINT:  Fever  SUBJECTIVE:  Felt dizzy today. Orthostasis vitals checked but BP did not change on standing. Still dyspneic and hypoxic.  PCCM re consulted to evaluate pleural effusions.     VITAL SIGNS: BP 102/62 (BP Location: Left Arm)   Pulse 84   Temp 98.4 F (36.9 C) (Oral)   Resp 18   Ht 5' 6"  (1.676 m)   Wt 127 lb 14.4 oz (58 kg)   SpO2 97%   BMI 20.64 kg/m   INTAKE / OUTPUT: I/O last 3 completed shifts: In: 1020 [P.O.:1020] Out: 2100 [Urine:2100]  PHYSICAL EXAMINATION: General:  No distress in bed Neuro:  Awake, alert, no distress HEENT:  No thyromegaly, JVD Cardiovascular:  RRR, no MRG Lungs:  Clear, No wheeze or crackles. Abdomen: soft, no masses Musculoskeletal:  Normal bulk, no deformities Skin:  No rash  LABS:  BMET  Recent Labs Lab 03/23/16 0413 03/24/16 0358 03/25/16 0824  NA 140 141 140  K 3.8 4.2 3.2*  CL 114* 111 104  CO2 24 26 31   BUN 36* 34* 33*  CREATININE 0.55 0.51 0.54  GLUCOSE 97 104* 122*    Electrolytes  Recent Labs Lab 03/23/16 0413 03/24/16 0358 03/25/16 0824  CALCIUM 7.4* 7.4* 7.7*    CBC  Recent Labs Lab 03/23/16 0413 03/24/16 0358 03/25/16 0824  WBC 3.3* 3.6* 2.8*  HGB 8.6* 8.6* 8.9*  HCT 25.6* 26.0* 26.6*  PLT 30* 39* 36*    Coag's No results for input(s): APTT, INR in the last 168 hours.  Sepsis Markers  Recent Labs Lab 03/19/16 0350  PROCALCITON 1.23    ABG No results for input(s): PHART, PCO2ART, PO2ART in the last 168 hours.  Liver Enzymes No results for input(s): AST, ALT, ALKPHOS, BILITOT, ALBUMIN in the last 168 hours.  Cardiac Enzymes No results for input(s): TROPONINI, PROBNP in the last 168 hours.  Glucose No results for input(s): GLUCAP in the last 168 hours.  Imaging No results  found. CT chest 03/24/16 > B/L pleural effusions, associated atelectasis. No PE Echo 03/24/16 > Grade 2 diastolic dysfunction. LVEF 45-50%  STUDIES:  Rt effusion 9/11    Lt effusion 9/11     CULTURES: 8/28 Sputum >> negative 8/28 Urine >> negative 8/28 Blood >> negative 9/02 Blood >> negative  ANTIBIOTICS: 8/27 Vancomycin >> 8/27 Cefepime >> 8/27 Acyclovir >>   SIGNIFICANT EVENTS: 8/27 Admit, oncology consulted 9/03 to ICU with low blood pressure  LINES/TUBES:  CXR over weekend personally reviewed> bilateral airspace disease, kyphosis, small effusions  DISCUSSION: 75 yo female with HCAP in setting of relapsed multiple myeloma on treatment with pomalidomide, dexamethasone, and daratumumab.  Slow improvement.  PMHx of CAD, HLD, Osteoporosis.  PCCM re consulted to evaluate pleural effusions as she is still hypoxic.  Effusions examined with U/S at bedside. They are smaller than what they appear on CT. Bilateral atelectasis seen on CT scan. If resp status declined then we can consider rt thora but would like to avoid if possible as platelets are low. Echo results noted with new systolic and diastolic dysfunction. I suspect the hypoxia, effusions are a result of the above, atelectasis, third spacing from malnutrition.   ASSESSMENT / PLAN:  PULMONARY A: Acute hypoxic respiratory failure. Bilateral pleural effusions with atelectasis Finished treatment for HCAP P:  Oxygen to keep SpO2 > 92% Repeat CXR in AM Continue flutter valve, added incentive spirometer to help with atelectasis Out of bed and ambulation with supervision Continue mucinex Optimize nutrition.   We will continue to follow. Discussed with Dr. Wynelle Cleveland.  Marshell Garfinkel MD [facility] Pager [phone] If no answer or after 3pm call: [phone] 03/25/2016, 12:15 PM

## 2016-03-25 NOTE — Progress Notes (Signed)
Patient called out and stated that the room was spinning. Orthostatics vitals were taken and charted. Patient stated this is the first time she has experienced dizziness since being admitted. Will continue to monitor closely.

## 2016-03-26 ENCOUNTER — Inpatient Hospital Stay (HOSPITAL_COMMUNITY): Payer: Medicare Other

## 2016-03-26 DIAGNOSIS — R0902 Hypoxemia: Secondary | ICD-10-CM

## 2016-03-26 DIAGNOSIS — J90 Pleural effusion, not elsewhere classified: Secondary | ICD-10-CM

## 2016-03-26 LAB — CBC
HCT: 23.9 % — ABNORMAL LOW (ref 36.0–46.0)
Hemoglobin: 7.9 g/dL — ABNORMAL LOW (ref 12.0–15.0)
MCH: 33.3 pg (ref 26.0–34.0)
MCHC: 33.1 g/dL (ref 30.0–36.0)
MCV: 100.8 fL — ABNORMAL HIGH (ref 78.0–100.0)
Platelets: 35 10*3/uL — ABNORMAL LOW (ref 150–400)
RBC: 2.37 MIL/uL — ABNORMAL LOW (ref 3.87–5.11)
RDW: 16.3 % — AB (ref 11.5–15.5)
WBC: 2.5 10*3/uL — AB (ref 4.0–10.5)

## 2016-03-26 LAB — DIFFERENTIAL
BASOS ABS: 0 10*3/uL (ref 0.0–0.1)
BASOS PCT: 0 %
EOS ABS: 0 10*3/uL (ref 0.0–0.7)
Eosinophils Relative: 0 %
Lymphocytes Relative: 44 %
Lymphs Abs: 1.1 10*3/uL (ref 0.7–4.0)
Monocytes Absolute: 0.1 10*3/uL (ref 0.1–1.0)
Monocytes Relative: 4 %
NEUTROS ABS: 1.2 10*3/uL — AB (ref 1.7–7.7)
NEUTROS PCT: 52 %

## 2016-03-26 LAB — BASIC METABOLIC PANEL WITH GFR
Anion gap: 3 — ABNORMAL LOW (ref 5–15)
BUN: 34 mg/dL — ABNORMAL HIGH (ref 6–20)
CO2: 30 mmol/L (ref 22–32)
Calcium: 7.9 mg/dL — ABNORMAL LOW (ref 8.9–10.3)
Chloride: 108 mmol/L (ref 101–111)
Creatinine, Ser: 0.47 mg/dL (ref 0.44–1.00)
GFR calc Af Amer: >60 mL/min
GFR calc non Af Amer: >60 mL/min
Glucose, Bld: 110 mg/dL — ABNORMAL HIGH (ref 65–99)
Potassium: 3.5 mmol/L (ref 3.5–5.1)
Sodium: 141 mmol/L (ref 135–145)

## 2016-03-26 MED ORDER — HYDROCORTISONE NA SUCCINATE PF 100 MG IJ SOLR: 25.0000 mg | Freq: Every day | INTRAMUSCULAR | Status: DC

## 2016-03-26 MED ORDER — BUDESONIDE 0.5 MG/2ML IN SUSP
0.5000 mg | Freq: Two times a day (BID) | RESPIRATORY_TRACT | Status: DC
  Administered 2016-03-26 – 2016-03-29 (×6): 0.5 mg via RESPIRATORY_TRACT
  Filled 2016-03-26 (×6): qty 2

## 2016-03-26 MED ORDER — FILGRASTIM 480 MCG/1.6ML IJ SOLN
480.0000 ug | Freq: Once | INTRAMUSCULAR | Status: AC
  Administered 2016-03-26: 480 ug via SUBCUTANEOUS
  Filled 2016-03-26 (×2): qty 1.6

## 2016-03-26 MED ORDER — FUROSEMIDE 10 MG/ML IJ SOLN
10.0000 mg | Freq: Once | INTRAMUSCULAR | Status: AC
  Administered 2016-03-26: 10 mg via INTRAVENOUS
  Filled 2016-03-26: qty 2

## 2016-03-26 MED ORDER — BUDESONIDE 0.25 MG/2ML IN SUSP: 0.2500 mg | Freq: Two times a day (BID) | RESPIRATORY_TRACT | Status: DC

## 2016-03-26 NOTE — Progress Notes (Signed)
PULMONARY / CRITICAL CARE MEDICINE   Name: Shannon Rush MRN: [id number] DOB: Jun 19, 1941    ADMISSION DATE:  03/10/2016 CONSULTATION DATE:  03/17/2016  REFERRING MD:  Dr. Chucky May  CHIEF COMPLAINT:  Fever  SUBJECTIVE:  Pt reports improved dyspnea with ambulation but does continue to get SOB with exertion.  Reports swollen ankles, cough with white sputum production.  Denies fevers, chills.  No further dizziness.     VITAL SIGNS: BP 100/63   Pulse 80   Temp 98.2 F (36.8 C) (Oral)   Resp 16   Ht 5' 6"  (1.676 m)   Wt 127 lb 14.4 oz (58 kg)   SpO2 93%   BMI 20.64 kg/m   INTAKE / OUTPUT: I/O last 3 completed shifts: In: 61 [P.O.:880] Out: 74 [Urine:700]  PHYSICAL EXAMINATION: General:  Elderly female in NAD, sitting in chair Neuro:  Awake, alert, no distress HEENT:  No thyromegaly, JVD, MM pink/moist, good dentition Cardiovascular:  RRR, no MRG Lungs:  Clear, No wheeze or crackles. Mildly diminished on R Abdomen: soft, no masses Musculoskeletal:  Normal bulk, no deformities Skin:  No rashes or lesions, 2+ pitting pre-tibial edema BLE   LABS:  BMET  Recent Labs Lab 03/24/16 0358 03/25/16 0824 03/26/16 0844  NA 141 140 141  K 4.2 3.2* 3.5  CL 111 104 108  CO2 26 31 30   BUN 34* 33* 34*  CREATININE 0.51 0.54 0.47  GLUCOSE 104* 122* 110*    Electrolytes  Recent Labs Lab 03/24/16 0358 03/25/16 0824 03/26/16 0844  CALCIUM 7.4* 7.7* 7.9*    CBC  Recent Labs Lab 03/24/16 0358 03/25/16 0824 03/26/16 0844  WBC 3.6* 2.8* 2.5*  HGB 8.6* 8.9* 7.9*  HCT 26.0* 26.6* 23.9*  PLT 39* 36* 35*    Coag's No results for input(s): APTT, INR in the last 168 hours.  Sepsis Markers No results for input(s): LATICACIDVEN, PROCALCITON, O2SATVEN in the last 168 hours.  ABG No results for input(s): PHART, PCO2ART, PO2ART in the last 168 hours.  Liver Enzymes No results for input(s): AST, ALT, ALKPHOS, BILITOT, ALBUMIN in the last 168 hours.  Cardiac  Enzymes No results for input(s): TROPONINI, PROBNP in the last 168 hours.  Glucose No results for input(s): GLUCAP in the last 168 hours.  Imaging Dg Chest Port 1 View  Result Date: 03/26/2016 CLINICAL DATA:  75 year old female with respiratory failure. Effusions. Subsequent encounter. EXAM: PORTABLE CHEST 1 VIEW COMPARISON:  03/24/2016 CT.  03/23/2016 chest x-ray. FINDINGS: Shifting of moderate-size bilateral pleural effusions. Basilar atelectasis more notable on left. Pulmonary vascular congestion. Remote left rib fractures and left clavicle fracture. Diffuse osseous changes of myeloma. Calcified aorta. IMPRESSION: Shifting of moderate-size bilateral pleural effusions. Persistent bibasilar atelectasis. Pulmonary vascular congestion. Electronically Signed   By: Genia Del M.D.   On: 03/26/2016 07:23     STUDIES:  CT chest 03/24/16 > B/L pleural effusions, associated atelectasis. No PE Echo 03/24/16 > Grade 2 diastolic dysfunction. LVEF 45-50%  Rt effusion 9/11    Lt effusion 9/11     CULTURES: 8/28 Sputum >> negative 8/28 Urine >> negative 8/28 Blood >> negative 9/02 Blood >> negative  ANTIBIOTICS: 8/27 Vancomycin >> 9/8 8/27 Cefepime >> 9/8 8/27 Acyclovir >> 9/8  SIGNIFICANT EVENTS: 8/27 Admit, oncology consulted 9/03 to ICU with low blood pressure 9/11 Felt dizzy today. Orthostasis vitals checked but BP did not change on standing. Still dyspneic and hypoxic.  PCCM re consulted to evaluate pleural effusions.  LINES/TUBES:  DISCUSSION:  75 yo female with a PMH of CAD, HLD, osteoporosis, admitted with HCAP in setting of relapsed multiple myeloma on treatment with pomalidomide, dexamethasone, and daratumumab.  Slow improvement.  PCCM re-consulted 9/11 to evaluate pleural effusions as she is still hypoxic.  Effusions examined with U/S at bedside. They are smaller than what they appear on CT. Bilateral atelectasis seen on CT scan.  If resp status declined then we can  consider rt thora but would like to avoid if possible as platelets are low.  ECHO results noted with new systolic and diastolic dysfunction. I suspect the hypoxia, effusions are a result of the above, atelectasis, third spacing from malnutrition.   ASSESSMENT / PLAN:  PULMONARY A: Acute hypoxic respiratory failure - in the setting of HCAP, bilateral pleural effusions, atelectasis & decompensated dCHF Bilateral pleural effusions with atelectasis Finished treatment for HCAP P:   Oxygen to keep SpO2 > 92% Intermittent CXR  Continue flutter valve, incentive spirometer to help with atelectasis Out of bed and ambulation with supervision Continue mucinex Optimize nutrition > prostat.  10 mg IV lasix 9/12 with f/u BMP in am to review electrolytes Hold on thoracentesis due to thrombocytopenia. Will need home O2 arranged at discharge Continue budesonide, duonebs for now.  Doubt will need at discharge   Relative Adrenal Insufficiency - on decadron at home, hypotensive after steroids stopped, improved with restart   P: Reduce hydrocortisone to 25 mg QD   Noe Gens, NP-C [facility] Pgr: [phone] or if no answer [phone] 03/26/2016, 11:09 AM   Attending note: I have seen and examined the patient with nurse practitioner/resident and agree with the note. History, labs and imaging reviewed.  75 Y/O with PMH of MM on chemo, sepsis with HCAP, shock, realtive adrenal insufficiency. Continue to have hypoxic resp failure. New systolic and diastolic cardiomyopathy B/L pleural effusions, atelectasis  - Continue gentle diuresis - Wean down o2 as tolerated. - OOB and ambulation. - Continue flutter valve and incentive spirometer. - Wean down stress dose steroids.  Rest of plan as above.  Marshell Garfinkel MD  [facility]ulmonary [facility] Pager [phone] If no answer or after 3pm call: [phone] 03/26/2016, 1:20 PM

## 2016-03-26 NOTE — Care Management Important Message (Signed)
Important Message  Patient Details  Name: Shannon Rush MRN: [id number] Date of Birth: 1941/04/24   Medicare Important Message Given:  Yes    Camillo Flaming 03/26/2016, 9:11 AMImportant Message  Patient Details  Name: Shannon Rush MRN: [id number] Date of Birth: January 25, 1941   Medicare Important Message Given:  Yes    Camillo Flaming 03/26/2016, 9:10 AMImportant Message  Patient Details  Name: Shannon Rush MRN: [id number] Date of Birth: 1940/12/30   Medicare Important Message Given:  Yes    Camillo Flaming 03/26/2016, 9:10 AM

## 2016-03-26 NOTE — Progress Notes (Signed)
CM continuing to follow for discharge planning. Marney Doctor RN,BSN,NCM [phone]

## 2016-03-26 NOTE — Progress Notes (Addendum)
Patient Name: Shannon Rush Date of Encounter: 03/26/2016  Principal Problem:   Acute respiratory failure [facility]) Active Problems:   Multiple myeloma ([place])   History of STEMI-Aug 2015- secondary to thrombotic state from chemo   CAD -50-60% RCA Aug 2015   Sepsis ([place])   Pancytopenia ([place])   Protein-calorie malnutrition ([place])   HCAP (healthcare-associated pneumonia)   Adrenocortical insufficiency ([place])   Patient on antineoplastic chemotherapy regimen   Cardiomyopathy-drop in EF to 45-50% etiology not yet determined   Primary Cardiologist: Dr Johnsie Cancel Patient Profile: 75 yo w/ hx STEMI ([place]) 2015 w/ thrombolytics (RCA 50-60%) 2nd chemo, mult myeloma, osteoporosis, HLD. Admitted 08/27 w/ HCAP, cards saw 09/11 for CHF  SUBJECTIVE: Not breathing quite as well as yesterday, was able to walk last pm, today more SOB at rest.  OBJECTIVE Vitals:   03/25/16 1436 03/25/16 1611 03/25/16 2112 03/26/16 0634  BP: 97/65 97/65 (!) 99/52 98/61  Pulse: (!) 106  100 100  Resp: 16   16  Temp: 97.3 F (36.3 C)  98.1 F (36.7 C) 98.2 F (36.8 C)  TempSrc: Oral  Oral Oral  SpO2: 92%  93% 93%  Weight:      Height:        Intake/Output Summary (Last 24 hours) at 03/26/16 0816 Last data filed at 03/26/16 5537  Gross per 24 hour  Intake              700 ml  Output              700 ml  Net                0 ml   Filed Weights   03/10/16 1447 03/24/16 0500 03/25/16 0600  Weight: 124 lb 9 oz (56.5 kg) 133 lb (60.3 kg) 127 lb 14.4 oz (58 kg)    PHYSICAL EXAM General: Well developed, well nourished, female in no acute distress. Head: Normocephalic, atraumatic.  Neck: Supple without bruits, JVD 12 cm Lungs:  Resp regular and unlabored, +rhonchi, + rales. Heart: RRR, S1, S2, no S3, S4, or murmur; no rub. Abdomen: Soft, non-tender, non-distended, BS + x 4.  Extremities: No clubbing, cyanosis, trace LE edema.  Neuro: Alert and oriented X 3. Moves all extremities spontaneously. Psych: Normal  affect.  LABS: CBC:  Recent Labs  03/24/16 0358 03/25/16 0824  WBC 3.6* 2.8*  HGB 8.6* 8.9*  HCT 26.0* 26.6*  MCV 100.8* 100.4*  PLT 39* 36*   Basic Metabolic Panel:  Recent Labs  03/24/16 0358 03/25/16 0824  NA 141 140  K 4.2 3.2*  CL 111 104  CO2 26 31  GLUCOSE 104* 122*  BUN 34* 33*  CREATININE 0.51 0.54  CALCIUM 7.4* 7.7*   BNP:  B Natriuretic Peptide  Date/Time Value Ref Range Status  03/25/2016 01:14 PM 188.9 (H) 0.0 - 100.0 pg/mL Final   Radiology/Studies: Ct Angio Chest Pe W Or Wo Contrast Result Date: 03/24/2016 CLINICAL DATA:  Hypoxia, history of multiple myeloma. EXAM: CT ANGIOGRAPHY CHEST WITH CONTRAST TECHNIQUE: Multidetector CT imaging of the chest was performed using the standard protocol during bolus administration of intravenous contrast. Multiplanar CT image reconstructions and MIPs were obtained to evaluate the vascular anatomy. CONTRAST:  100 mL of Isovue 370 intravenously. COMPARISON:  CT scan of July 13, 2012. FINDINGS: No pneumothorax is noted. Moderate bilateral pleural effusions are noted. Subsegmental atelectasis of right lower lobe is noted. Large atelectasis of left lower lobe is noted. There  is no evidence of pulmonary embolus. There is no evidence of thoracic aortic dissection or aneurysm. Coronary artery calcifications are noted. No mediastinal mass or adenopathy is noted. Sclerotic densities are noted throughout the ribs consistent with old myeloma lesions. Focal lytic area is seen involving right scapula concerning for multiple myeloma. Multiple severe old compression fractures are seen involving the thoracic spine consistent with multiple myeloma. Old sternal fracture is noted. Review of the MIP images confirms the above findings. IMPRESSION: No evidence of pulmonary embolus. Moderate bilateral pleural effusions are noted with atelectasis of the adjacent lower lobes. Coronary artery calcifications are noted. Sclerotic and lytic bone lesions  are seen consistent with history of multiple myeloma. Electronically Signed   By: Marijo Conception, M.D.   On: 03/24/2016 11:56   Dg Chest Port 1 View Result Date: 03/26/2016 CLINICAL DATA:  75 year old female with respiratory failure. Effusions. Subsequent encounter. EXAM: PORTABLE CHEST 1 VIEW COMPARISON:  03/24/2016 CT.  03/23/2016 chest x-ray. FINDINGS: Shifting of moderate-size bilateral pleural effusions. Basilar atelectasis more notable on left. Pulmonary vascular congestion. Remote left rib fractures and left clavicle fracture. Diffuse osseous changes of myeloma. Calcified aorta. IMPRESSION: Shifting of moderate-size bilateral pleural effusions. Persistent bibasilar atelectasis. Pulmonary vascular congestion. Electronically Signed   By: Genia Del M.D.   On: 03/26/2016 07:23     Current Medications:  . atorvastatin  40 mg Oral Daily  . calcium carbonate  1,250 mg Oral Daily  . feeding supplement (PRO-STAT SUGAR FREE 64)  30 mL Oral BID  . fluticasone  2 spray Each Nare Daily  . guaiFENesin  1,200 mg Oral BID  . hydrocortisone sod succinate (SOLU-CORTEF) inj  25 mg Intravenous Q12H  . metoprolol succinate  25 mg Oral Daily  . potassium chloride  20 mEq Oral Daily      ASSESSMENT AND PLAN: 75 year old female with known multiple myeloma since 2013 treated with Revlimid, recently switched to (pomalidomide, dexamethasone, daratumumab) with pancytopenia, admitted with acute hypoxic respiratory failure sec to pneumonia. She also has a h/o STEMI in August 2015 in [place] rx with a thrombolytic. Cath w/ 50-60% RCA. Felt a thrombotic event secondary to chemotherapy (Revlimid). She was on ASA, Plavix, statin and beta blocker.   Principal Problem:   Acute respiratory failure (HCC) - EF now 45-50% possibly secondary to daratumumab w/ grade 2 dd - CXR today w/ pleural effusions, got low-dose IV Lasix thru yesterday, ?continue this as she is still SOB at rest, on O2  - make sure to get daily weights,  recheck BMET today - CCM involved  Otherwise, per CCM/IM/Oncology Active Problems:   Multiple myeloma ([place])   History of STEMI-Aug 2015- secondary to thrombotic state from chemo   CAD -50-60% RCA Aug 2015   Sepsis ([place])   Pancytopenia ([place])   Protein-calorie malnutrition ([place])   HCAP (healthcare-associated pneumonia)   Adrenocortical insufficiency ([place])   Patient on antineoplastic chemotherapy regimen   Cardiomyopathy-drop in EF to 45-50% etiology not yet determined  Signed, Barrett, Rhonda , PA-C 8:16 AM 03/26/2016  The patient was seen, examined and discussed with Rosaria Ferries, PA-C and I agree with the above.   75 year old female with known multiple myeloma since 2013 treated with Revlimid, recently switched to (pomalidomide, dexamethasone, daratumumab) with pancytopenia, admitted with acute hypoxic respiratory failure sec to pneumonia. She also has a h/o STEMI in August 2015 in [place] and was treated with a thrombolytic. Cath showed a 50-60% RCA. It was felt this was  a thrombotic event secondary to chemotherapy (Revlimid). She was on ASA, Plavix, statin and beta blocker.   During this hospital visit she was volume resuscitated for septic shock, then diuresed, now still hypoxic, pulmonary asking cardiology input for possible CHF. Her LVEF is mildly decreased from previous 55-60% in 2015, now 45-50%, previously on daratumumab, not shown to cause LVEF decrease in clinical trials. On physical exam she has decreased breathing sounds in the lung bases and minimal B/L pitting edema.  I would continue betablockers and follow her echocardiogram every 3 months if the same chemotherapy is going to be continued. However, this appears to be a prolonged course of pneumonia in a immunosuppressed patient, her BNP is 180. In addition she has a slow recovery from pancytopenia post cheo with Hb drop today, this is possibly contributing to her fatigue and desaturation. If oncology consider a blood  transfusion I would be supportive of that, followed by 40 mg of po lasix after each unit.  Ena Dawley 03/26/2016

## 2016-03-26 NOTE — Progress Notes (Signed)
Shannon Rush   DOB:[birth year]   WN#:[id number]   [id number]  Hematology and Oncology follow up note  Subjective: Shannon Rush walked in the hallway this morning, with PT, still on 2L [place] oxygen. CXR showed pulmonary congestion, she received iv lasix 54m today. WBC trending down again, ANC 1.2K, plt stable, afebrile. She had dizziness spell this morning, BP normal.   Objective:  Vitals:   03/26/16 1010 03/26/16 1413  BP: 100/63 100/60  Pulse: 80 91  Resp:  18  Temp:  98.3 F (36.8 C)    Body mass index is 19.86 kg/m.  Intake/Output Summary (Last 24 hours) at 03/26/16 1859 Last data filed at 03/26/16 1510  Gross per 24 hour  Intake              700 ml  Output             1050 ml  Net             -350 ml     Sclerae unicteric  Oropharynx clear  No peripheral adenopathy  Lungs clear -- no rales or rhonchi, decreased breath sound on bilateral lung bases.  Heart regular rate and rhythm  Abdomen benign  MSK no focal spinal tenderness, no peripheral edema  Neuro nonfocal    CBG (last 3)  No results for input(s): GLUCAP in the last 72 hours.   Labs:  Lab Results  Component Value Date   WBC 2.5 (L) 03/26/2016   HGB 7.9 (L) 03/26/2016   HCT 23.9 (L) 03/26/2016   MCV 100.8 (H) 03/26/2016   PLT 35 (L) 03/26/2016   NEUTROABS 1.2 (L) 03/26/2016    CMP Latest Ref Rng & Units 03/26/2016 03/25/2016 03/24/2016  Glucose 65 - 99 mg/dL 110(H) 122(H) 104(H)  BUN 6 - 20 mg/dL 34(H) 33(H) 34(H)  Creatinine 0.44 - 1.00 mg/dL 0.47 0.54 0.51  Sodium 135 - 145 mmol/L 141 140 141  Potassium 3.5 - 5.1 mmol/L 3.5 3.2(L) 4.2  Chloride 101 - 111 mmol/L 108 104 111  CO2 22 - 32 mmol/L _0 Calcium 8.9 - 10.3 mg/dL 7.9(L) 7.7(L) 7.4(L)  Total Protein 6.5 - 8.1 g/dL - - -  Total Bilirubin 0.3 - 1.2 mg/dL - - -  Alkaline Phos 38 - 126 U/L - - -  AST 15 - 41 U/L - - -  ALT 14 - 54 U/L - - -     Urine Studies No results for input(s): UHGB, CRYS in the last 72 hours.  Invalid input(s):  UACOL, UAPR, USPG, UPH, UTP, UGL, UKET, UBIL, UNIT, UROB, [place] UEPI, UWBC, UJunie [place] Basic Metabolic Panel:  Recent Labs Lab 03/22/16 0410 03/23/16 0413 03/24/16 0358 03/25/16 0824 03/26/16 0844  NA 139 140 141 140 141  K 4.2 3.8 4.2 3.2* 3.5  CL 113* 114* 111 104 108  CO2 _1 GLUCOSE 115* 97 104* 122* 110*  BUN 38* 36* 34* 33* 34*  CREATININE 0.71 0.55 0.51 0.54 0.47  CALCIUM 7.4* 7.4* 7.4* 7.7* 7.9*   GFR Estimated Creatinine Clearance: 55.2 mL/min (by C-G formula based on SCr of 0.8 mg/dL). Liver Function Tests: No results for input(s): AST, ALT, ALKPHOS, BILITOT, PROT, ALBUMIN in the last 168 hours. No results for input(s): LIPASE, AMYLASE in the last 168 hours. No results for input(s): AMMONIA in the last 168 hours. Coagulation profile No results for input(s): INR, PROTIME in the last 168 hours.  CBC:  Recent  Labs Lab 03/20/16 0404 03/21/16 0351 03/22/16 0410 03/23/16 0413 03/24/16 0358 03/25/16 0824 03/26/16 0844  WBC 1.6* 2.1* 2.7* 3.3* 3.6* 2.8* 2.5*  NEUTROABS 0.9* 1.3* 1.7  --   --   --  1.2*  HGB 8.0* 8.1* 8.1* 8.6* 8.6* 8.9* 7.9*  HCT 22.9* 23.8* 23.8* 25.6* 26.0* 26.6* 23.9*  MCV 94.2 97.5 98.3 99.6 100.8* 100.4* 100.8*  PLT 19* 23* 27* 30* 39* 36* 35*   Cardiac Enzymes: No results for input(s): CKTOTAL, CKMB, CKMBINDEX, TROPONINI in the last 168 hours. BNP: Invalid input(s): POCBNP CBG: No results for input(s): GLUCAP in the last 168 hours. D-Dimer No results for input(s): DDIMER in the last 72 hours. Hgb A1c No results for input(s): HGBA1C in the last 72 hours. Lipid Profile No results for input(s): CHOL, HDL, LDLCALC, TRIG, CHOLHDL, LDLDIRECT in the last 72 hours. Thyroid function studies No results for input(s): TSH, T4TOTAL, T3FREE, THYROIDAB in the last 72 hours.  Invalid input(s): FREET3 Anemia work up No results for input(s): VITAMINB12, FOLATE, FERRITIN, TIBC, IRON, RETICCTPCT in the last 72  hours. Microbiology Recent Results (from the past 240 hour(s))  Culture, expectorated sputum-assessment     Status: None   Collection Time: 03/16/16  9:00 PM  Result Value Ref Range Status   Specimen Description SPUTUM  Final   Special Requests NONE  Final   Sputum evaluation   Final    THIS SPECIMEN IS ACCEPTABLE. RESPIRATORY CULTURE REPORT TO FOLLOW.   Report Status 03/21/2016 FINAL  Final  MRSA PCR Screening     Status: None   Collection Time: 03/19/16  9:33 AM  Result Value Ref Range Status   MRSA by PCR NEGATIVE NEGATIVE Final    Comment:        The GeneXpert MRSA Assay (FDA approved for NASAL specimens only), is one component of a comprehensive MRSA colonization surveillance program. It is not intended to diagnose MRSA infection nor to guide or monitor treatment for MRSA infections.   Culture, respiratory (NON-Expectorated)     Status: None   Collection Time: 03/21/16  9:43 PM  Result Value Ref Range Status   Specimen Description SPU  Final   Special Requests NONE  Final   Gram Stain   Final    ABUNDANT WBC PRESENT,BOTH PMN AND MONONUCLEAR ABUNDANT GRAM POSITIVE COCCI IN PAIRS IN CHAINS ABUNDANT GRAM VARIABLE ROD ABUNDANT YEAST    Culture   Final    Consistent with normal respiratory flora. Performed at [facility]    Report Status 03/24/2016 FINAL  Final  Culture, blood (x 2)     Status: None (Preliminary result)   Collection Time: 03/22/16 12:36 PM  Result Value Ref Range Status   Specimen Description BLOOD LEFT ARM  Final   Special Requests BOTTLES DRAWN AEROBIC AND ANAEROBIC [place]  Final   Culture   Final    NO GROWTH 4 DAYS Performed at [facility]    Report Status PENDING  Incomplete  Culture, blood (x 2)     Status: None (Preliminary result)   Collection Time: 03/22/16 12:44 PM  Result Value Ref Range Status   Specimen Description BLOOD RIGHT ANTECUBITAL  Final   Special Requests BOTTLES DRAWN AEROBIC AND ANAEROBIC 10CC EACH   Final   Culture   Final    NO GROWTH 4 DAYS Performed at [facility]    Report Status PENDING  Incomplete    Studies:  Dg Chest Port 1 View  Result Date:  03/26/2016 CLINICAL DATA:  75 year old female with respiratory failure. Effusions. Subsequent encounter. EXAM: PORTABLE CHEST 1 VIEW COMPARISON:  03/24/2016 CT.  03/23/2016 chest x-ray. FINDINGS: Shifting of moderate-size bilateral pleural effusions. Basilar atelectasis more notable on left. Pulmonary vascular congestion. Remote left rib fractures and left clavicle fracture. Diffuse osseous changes of myeloma. Calcified aorta. IMPRESSION: Shifting of moderate-size bilateral pleural effusions. Persistent bibasilar atelectasis. Pulmonary vascular congestion. Electronically Signed   By: Genia Del M.D.   On: 03/26/2016 07:23    Assessment: 75 y.o. female with history of multiple myeloma status post autotransplant in August 2014, recently had disease relapse, and switched to new chemotherapy treatment with pomalidomide, dexa and daratumumab    1. HCAP with hypoxic respiratory failure, improved  2. Bilateral pulmonary effusion, likely related to fluid overload and CHF  3. Sepsis with hypotension, resolved  4. Multiple myeloma, relapsed in July 2017, on new chemo  5. Pancytopenia due to chemotherapy, neotropenia again  6. Coronary artery disease, history of STEMI in August 2015 7. CHF exacerbation  8. Malnutrition and deconditioning   Plan:  -her WBC has been trending down again since off GCSF, ANC 1.2K today, will restart neupogen, I ordered -Hb 7.9 today, consider 1u blood transfusion with lasix tomorrow if Hb still below 8.0 -I think she may benefit from more aggressive diuretics  -I encourage her to eat more and continue exercise   Will follow up.   [name], MD 03/26/2016  6:59 PM

## 2016-03-26 NOTE — Progress Notes (Signed)
PROGRESS NOTE    Shannon Rush  KNL:[id number] DOB: 1940/12/09 DOA: 03/10/2016  PCP: [name], MD   Brief Narrative:  75 year old female with history of multiple myeloma on chemotherapy (pomalidomide, dexamethasone, daratumumab), CAD, hyperlipidemia, osteoporosis, admitted with sepsis secondary to pneumonia, as well with neutropenia secondary to chemotherapy- treated with vancomycin and cefepime- remained septic, hypotensive and therefore transferred to stepdown on 9/3 - Blood pressure improved on stress dose steroids.   Subjective:    Still becoming short of breath with movement. Coughing up clear mucous still.   Assessment & Plan:   Sepsis ([place]) - Secondary to healthcare associated pneumonia  - has been on empiric vancomycin and cefepime, stopped vancomycin 8/31, resumed on 9/2 as she became febrile again. - Cultures so far negative. -transferred to stepdown on 9/3 for hypotension- did not need any pressors -pressure improved with stress dose steroids- continuing to slowly wean steroids- will stop Hydrocortisone today- BP has been stable   Acute respiratory failure -HCAP-  pleural effusions - Left lower lobe-Vancomycin, cefepime, acyclovir- changed antibiotics to Ceftin and Doxycyline and then stopped after a total of a 2 wk course- CT shows resolution of pneumonia - cont oral Acyclovir which she was taking at home- - resp culture >> normal resp flora -Oxygen weaned off- doing well on room air on 9/7 - hypoxic again on 9/9 with pulse ox in 70-80- CXR showed small pleural effusions- CT shows these to be moderate in size- no PE and no consolidation noted - 9/10 - obtained ECHO with shows EF of 45-50 % and Grade 2 diastolic dysfunction which is new -  started low dose Lasix however continues to be hypoxic- see below in regards to CHF - PCCM checked with ultrasound- effusions have nearly resolved with Lasix and do not warrant thoracentesis - also has atelectasis-  ambulate, incentive spirometry, cont Nebs for now and she still has a cough with congestion which is likely post inflammatory  Acute systolic and diastolic CHF - new finding - have resumed home dose of Toprol - hold off on ACE for now - cardiology following and assisting with management- given low dose IV Lasix this AM  Pancytopenia  - due to chemo -  Neutropenia: Secondary to recent chemotherapy. Management per oncology- improved with Neupogen  - Thrombocytopenia: Received 2 unit platelet 9/1,  9/4-  -  Anemia: received 1 unit PRBC 9/30 for hemoglobin of 7.3-hemoglobin down to 7.9 today- follow - appreciate oncology f/u  Multiple myeloma ([place]) - Follows with Dr Burr Medico   - being treated with Pomalidomide, Dexamethasone, Daratumumab, Zometa  Coronary atherosclerosis of native coronary artery - Continue Toprol - hold Plavix given thrombocytopenia  Protein-calorie malnutrition ([place]) - Continue with ensure  Hypokalemia/hypophosphatemia Replenished   DVT prophylaxis: SCDs only due to thrombocytopenia Code Status: Full Family Communication:  Disposition Plan: home in 2-3 days once hypoxia resolves Consultants:   Pulmonary, oncology Procedures:    Antimicrobials:  Anti-infectives    Start     Dose/Rate Route Frequency Ordered Stop   03/24/16 1100  doxycycline (VIBRA-TABS) tablet 100 mg  Status:  Discontinued     100 mg Oral Every 12 hours 03/24/16 1015 03/25/16 1051   03/22/16 1700  cefUROXime (CEFTIN) tablet 500 mg  Status:  Discontinued     500 mg Oral 2 times daily with meals 03/22/16 1148 03/25/16 1051   03/18/16 2200  vancomycin (VANCOCIN) IVPB 750 mg/150 ml premix  Status:  Discontinued     750 mg 150 mL/hr over  60 Minutes Intravenous Every 12 hours 03/18/16 2134 03/22/16 1148   03/16/16 0900  vancomycin (VANCOCIN) IVPB 1000 mg/200 mL premix  Status:  Discontinued     1,000 mg 200 mL/hr over 60 Minutes Intravenous Every 12 hours 03/16/16 0802 03/18/16 2133    03/12/16 2200  vancomycin (VANCOCIN) IVPB 1000 mg/200 mL premix  Status:  Discontinued     1,000 mg 200 mL/hr over 60 Minutes Intravenous Every 12 hours 03/12/16 1246 03/14/16 0937   03/12/16 1245  vancomycin (VANCOCIN) IVPB 1000 mg/200 mL premix     1,000 mg 200 mL/hr over 60 Minutes Intravenous STAT 03/12/16 1233 03/12/16 1348   03/12/16 1200  ceFEPIme (MAXIPIME) 2 g in dextrose 5 % 50 mL IVPB  Status:  Discontinued     2 g 100 mL/hr over 30 Minutes Intravenous Every 8 hours 03/12/16 0843 03/22/16 1148   03/11/16 0000  vancomycin (VANCOCIN) IVPB 750 mg/150 ml premix  Status:  Discontinued     750 mg 150 mL/hr over 60 Minutes Intravenous Every 12 hours 03/10/16 1205 03/12/16 1233   03/10/16 2200  acyclovir (ZOVIRAX) tablet 400 mg  Status:  Discontinued     400 mg Oral 2 times daily 03/10/16 1511 03/22/16 0727   03/10/16 2000  ceFEPIme (MAXIPIME) 1 g in dextrose 5 % 50 mL IVPB  Status:  Discontinued     1 g 100 mL/hr over 30 Minutes Intravenous Every 8 hours 03/10/16 1307 03/12/16 0843   03/10/16 1400  aztreonam (AZACTAM) 2 g in dextrose 5 % 50 mL IVPB  Status:  Discontinued     2 g 100 mL/hr over 30 Minutes Intravenous Every 8 hours 03/10/16 1341 03/10/16 1344   03/10/16 1115  ceFEPIme (MAXIPIME) 2 g in dextrose 5 % 50 mL IVPB     2 g 100 mL/hr over 30 Minutes Intravenous STAT 03/10/16 1100 03/10/16 1306   03/10/16 1100  vancomycin (VANCOCIN) IVPB 750 mg/150 ml premix     750 mg 150 mL/hr over 60 Minutes Intravenous STAT 03/10/16 1057 03/10/16 1406   03/10/16 1045  aztreonam (AZACTAM) 2 g in dextrose 5 % 50 mL IVPB  Status:  Discontinued     2 g 100 mL/hr over 30 Minutes Intravenous  Once 03/10/16 1042 03/10/16 1100       Objective: Vitals:   03/25/16 1611 03/25/16 2112 03/26/16 0634 03/26/16 1010  BP: 97/65 (!) 99/52 98/61 100/63  Pulse:  100 100 80  Resp:   16   Temp:  98.1 F (36.7 C) 98.2 F (36.8 C)   TempSrc:  Oral Oral   SpO2:  93% 93%   Weight:      Height:          Intake/Output Summary (Last 24 hours) at 03/26/16 1315 Last data filed at 03/26/16 0948  Gross per 24 hour  Intake              700 ml  Output             1100 ml  Net             -400 ml   Filed Weights   03/10/16 1447 03/24/16 0500 03/25/16 0600  Weight: 56.5 kg (124 lb 9 oz) 60.3 kg (133 lb) 58 kg (127 lb 14.4 oz)    Examination: General exam: Appears comfortable  HEENT: PERRLA, oral mucosa moist, no sclera icterus or thrush Respiratory system: Clear to auscultation. Respiratory effort normal. Cardiovascular system: S1 & S2 heard, RRR.  No murmurs  Gastrointestinal system: Abdomen soft, non-tender, nondistended. Normal bowel sound. No organomegaly Central nervous system: Alert and oriented. No focal neurological deficits. Extremities: No cyanosis, clubbing or edema Skin: No rashes or ulcers Psychiatry:  Mood & affect appropriate.     Data Reviewed: I have personally reviewed following labs and imaging studies  CBC:  Recent Labs Lab 03/20/16 0404 03/21/16 0351 03/22/16 0410 03/23/16 0413 03/24/16 0358 03/25/16 0824 03/26/16 0844  WBC 1.6* 2.1* 2.7* 3.3* 3.6* 2.8* 2.5*  NEUTROABS 0.9* 1.3* 1.7  --   --   --  1.2*  HGB 8.0* 8.1* 8.1* 8.6* 8.6* 8.9* 7.9*  HCT 22.9* 23.8* 23.8* 25.6* 26.0* 26.6* 23.9*  MCV 94.2 97.5 98.3 99.6 100.8* 100.4* 100.8*  PLT 19* 23* 27* 30* 39* 36* 35*   Basic Metabolic Panel:  Recent Labs Lab 03/22/16 0410 03/23/16 0413 03/24/16 0358 03/25/16 0824 03/26/16 0844  NA 139 140 141 140 141  K 4.2 3.8 4.2 3.2* 3.5  CL 113* 114* 111 104 108  CO2 22 24 26 31 30   GLUCOSE 115* 97 104* 122* 110*  BUN 38* 36* 34* 33* 34*  CREATININE 0.71 0.55 0.51 0.54 0.47  CALCIUM 7.4* 7.4* 7.4* 7.7* 7.9*   GFR: Estimated Creatinine Clearance: 57.3 mL/min (by C-G formula based on SCr of 0.8 mg/dL). Liver Function Tests: No results for input(s): AST, ALT, ALKPHOS, BILITOT, PROT, ALBUMIN in the last 168 hours. No results for input(s): LIPASE,  AMYLASE in the last 168 hours. No results for input(s): AMMONIA in the last 168 hours. Coagulation Profile: No results for input(s): INR, PROTIME in the last 168 hours. Cardiac Enzymes: No results for input(s): CKTOTAL, CKMB, CKMBINDEX, TROPONINI in the last 168 hours. BNP (last 3 results) No results for input(s): PROBNP in the last 8760 hours. HbA1C: No results for input(s): HGBA1C in the last 72 hours. CBG: No results for input(s): GLUCAP in the last 168 hours. Lipid Profile: No results for input(s): CHOL, HDL, LDLCALC, TRIG, CHOLHDL, LDLDIRECT in the last 72 hours. Thyroid Function Tests: No results for input(s): TSH, T4TOTAL, FREET4, T3FREE, THYROIDAB in the last 72 hours. Anemia Panel: No results for input(s): VITAMINB12, FOLATE, FERRITIN, TIBC, IRON, RETICCTPCT in the last 72 hours. Urine analysis:    Component Value Date/Time   COLORURINE AMBER (A) 03/16/2016 1645   APPEARANCEUR CLOUDY (A) 03/16/2016 1645   LABSPEC 1.029 03/16/2016 1645   LABSPEC 1.015 08/20/2012 1124   PHURINE 6.0 03/16/2016 1645   GLUCOSEU 100 (A) 03/16/2016 1645   GLUCOSEU Negative 08/20/2012 1124   HGBUR MODERATE (A) 03/16/2016 1645   HGBUR negative 10/22/2007 1336   BILIRUBINUR NEGATIVE 03/16/2016 1645   BILIRUBINUR Negative 08/20/2012 1124   KETONESUR NEGATIVE 03/16/2016 1645   PROTEINUR 100 (A) 03/16/2016 1645   UROBILINOGEN 0.2 09/14/2014 2100   UROBILINOGEN 0.2 08/20/2012 1124   NITRITE NEGATIVE 03/16/2016 1645   LEUKOCYTESUR NEGATIVE 03/16/2016 1645   LEUKOCYTESUR Small 08/20/2012 1124   Sepsis Labs: @LABRCNTIP (procalcitonin:4,lacticidven:4) ) Recent Results (from the past 240 hour(s))  Culture, expectorated sputum-assessment     Status: None   Collection Time: 03/16/16  9:00 PM  Result Value Ref Range Status   Specimen Description SPUTUM  Final   Special Requests NONE  Final   Sputum evaluation   Final    THIS SPECIMEN IS ACCEPTABLE. RESPIRATORY CULTURE REPORT TO FOLLOW.   Report  Status 03/21/2016 FINAL  Final  MRSA PCR Screening     Status: None   Collection Time: 03/19/16  9:33  AM  Result Value Ref Range Status   MRSA by PCR NEGATIVE NEGATIVE Final    Comment:        The GeneXpert MRSA Assay (FDA approved for NASAL specimens only), is one component of a comprehensive MRSA colonization surveillance program. It is not intended to diagnose MRSA infection nor to guide or monitor treatment for MRSA infections.   Culture, respiratory (NON-Expectorated)     Status: None   Collection Time: 03/21/16  9:43 PM  Result Value Ref Range Status   Specimen Description SPU  Final   Special Requests NONE  Final   Gram Stain   Final    ABUNDANT WBC PRESENT,BOTH PMN AND MONONUCLEAR ABUNDANT GRAM POSITIVE COCCI IN PAIRS IN CHAINS ABUNDANT GRAM VARIABLE ROD ABUNDANT YEAST    Culture   Final    Consistent with normal respiratory flora. Performed at [facility]    Report Status 03/24/2016 FINAL  Final  Culture, blood (x 2)     Status: None (Preliminary result)   Collection Time: 03/22/16 12:36 PM  Result Value Ref Range Status   Specimen Description BLOOD LEFT ARM  Final   Special Requests BOTTLES DRAWN AEROBIC AND ANAEROBIC [facility] EACH  Final   Culture   Final    NO GROWTH 3 DAYS Performed at [facility]    Report Status PENDING  Incomplete  Culture, blood (x 2)     Status: None (Preliminary result)   Collection Time: 03/22/16 12:44 PM  Result Value Ref Range Status   Specimen Description BLOOD RIGHT ANTECUBITAL  Final   Special Requests BOTTLES DRAWN AEROBIC AND ANAEROBIC 10CC EACH  Final   Culture   Final    NO GROWTH 3 DAYS Performed at [facility]    Report Status PENDING  Incomplete         Radiology Studies: Dg Chest Port 1 View  Result Date: 03/26/2016 CLINICAL DATA:  75 year old female with respiratory failure. Effusions. Subsequent encounter. EXAM: PORTABLE CHEST 1 VIEW COMPARISON:  03/24/2016 CT.  03/23/2016 chest x-ray.  FINDINGS: Shifting of moderate-size bilateral pleural effusions. Basilar atelectasis more notable on left. Pulmonary vascular congestion. Remote left rib fractures and left clavicle fracture. Diffuse osseous changes of myeloma. Calcified aorta. IMPRESSION: Shifting of moderate-size bilateral pleural effusions. Persistent bibasilar atelectasis. Pulmonary vascular congestion. Electronically Signed   By: Genia Del M.D.   On: 03/26/2016 07:23      Scheduled Meds: . atorvastatin  40 mg Oral Daily  . budesonide (PULMICORT) nebulizer solution  0.5 mg Nebulization BID  . calcium carbonate  1,250 mg Oral Daily  . feeding supplement (PRO-STAT SUGAR FREE 64)  30 mL Oral BID  . fluticasone  2 spray Each Nare Daily  . guaiFENesin  1,200 mg Oral BID  . [START ON 03/27/2016] hydrocortisone sod succinate (SOLU-CORTEF) inj  25 mg Intravenous Daily  . metoprolol succinate  25 mg Oral Daily  . potassium chloride  20 mEq Oral Daily   Continuous Infusions:     LOS: 16 days    Time spent in minutes: 20    Kane, MD [facility] Pager: [email or web address] Password [email or web address] 03/26/2016, 1:15 PM

## 2016-03-27 ENCOUNTER — Inpatient Hospital Stay (HOSPITAL_COMMUNITY): Payer: Medicare Other

## 2016-03-27 DIAGNOSIS — I519 Heart disease, unspecified: Secondary | ICD-10-CM

## 2016-03-27 DIAGNOSIS — I5189 Other ill-defined heart diseases: Secondary | ICD-10-CM | POA: Diagnosis present

## 2016-03-27 DIAGNOSIS — E46 Unspecified protein-calorie malnutrition: Secondary | ICD-10-CM

## 2016-03-27 DIAGNOSIS — Z5111 Encounter for antineoplastic chemotherapy: Secondary | ICD-10-CM

## 2016-03-27 DIAGNOSIS — J90 Pleural effusion, not elsewhere classified: Secondary | ICD-10-CM

## 2016-03-27 LAB — CULTURE, BLOOD (ROUTINE X 2)
CULTURE: NO GROWTH
Culture: NO GROWTH

## 2016-03-27 LAB — CBC
HCT: 21.5 % — ABNORMAL LOW (ref 36.0–46.0)
HEMOGLOBIN: 7.4 g/dL — AB (ref 12.0–15.0)
MCH: 33.9 pg (ref 26.0–34.0)
MCHC: 34.4 g/dL (ref 30.0–36.0)
MCV: 98.6 fL (ref 78.0–100.0)
Platelets: 36 10*3/uL — ABNORMAL LOW (ref 150–400)
RBC: 2.18 MIL/uL — AB (ref 3.87–5.11)
RDW: 15.8 % — ABNORMAL HIGH (ref 11.5–15.5)
WBC: 2.7 10*3/uL — AB (ref 4.0–10.5)

## 2016-03-27 LAB — BODY FLUID CELL COUNT WITH DIFFERENTIAL
Eos, Fluid: 0 %
Lymphs, Fluid: 59 %
Monocyte-Macrophage-Serous Fluid: 28 % — ABNORMAL LOW (ref 50–90)
Neutrophil Count, Fluid: 13 % (ref 0–25)
Total Nucleated Cell Count, Fluid: 210 uL (ref 0–1000)

## 2016-03-27 LAB — LACTATE DEHYDROGENASE, PLEURAL OR PERITONEAL FLUID: LD, Fluid: 88 U/L — ABNORMAL HIGH (ref 3–23)

## 2016-03-27 LAB — BASIC METABOLIC PANEL
ANION GAP: 3 — AB (ref 5–15)
BUN: 38 mg/dL — ABNORMAL HIGH (ref 6–20)
CALCIUM: 8 mg/dL — AB (ref 8.9–10.3)
CO2: 30 mmol/L (ref 22–32)
Chloride: 109 mmol/L (ref 101–111)
Creatinine, Ser: 0.58 mg/dL (ref 0.44–1.00)
Glucose, Bld: 90 mg/dL (ref 65–99)
POTASSIUM: 4 mmol/L (ref 3.5–5.1)
SODIUM: 142 mmol/L (ref 135–145)

## 2016-03-27 LAB — PROTEIN, BODY FLUID: Total protein, fluid: <3 g/dL

## 2016-03-27 LAB — PROTEIN, TOTAL: Total Protein: 4.4 g/dL — ABNORMAL LOW (ref 6.5–8.1)

## 2016-03-27 LAB — LACTATE DEHYDROGENASE: LDH: 130 U/L (ref 98–192)

## 2016-03-27 MED ORDER — FILGRASTIM 480 MCG/1.6ML IJ SOLN
480.0000 ug | Freq: Every day | INTRAMUSCULAR | Status: DC
  Administered 2016-03-27 – 2016-03-28 (×2): 480 ug via SUBCUTANEOUS
  Filled 2016-03-27 (×3): qty 1.6

## 2016-03-27 MED ORDER — FUROSEMIDE 10 MG/ML IJ SOLN
40.0000 mg | Freq: Once | INTRAMUSCULAR | Status: AC
  Administered 2016-03-27: 40 mg via INTRAVENOUS
  Filled 2016-03-27: qty 4

## 2016-03-27 MED ORDER — SODIUM CHLORIDE 0.9 % IV SOLN
Freq: Once | INTRAVENOUS | Status: AC
  Administered 2016-03-27: 15:00:00 via INTRAVENOUS

## 2016-03-27 NOTE — Procedures (Signed)
Thoracentesis Procedure Note  Pre-operative Diagnosis: Right Pleural Effusion   Post-operative Diagnosis: same  Indications: Diagnostic evaluation of pleural fluid and therapeutic relief of dyspnea  Procedure Details  Consent: Informed consent was obtained. Risks of the procedure were discussed including: infection, bleeding, pain, pneumothorax.  Under sterile conditions the patient was positioned. Betadine solution and sterile drapes were utilized.  1% plain lidocaine was used to anesthetize the 8th rib space. Fluid was obtained without any difficulties and minimal blood loss.  A dressing was applied to the wound and wound care instructions were provided.   Findings 600 ml of clear dark yellow pleural fluid was obtained. A sample was sent to Pathology, cell counts, as well as for infection analysis.  Complications:  None; patient tolerated the procedure well.          Condition: stable  Plan A follow up chest x-ray was ordered.    Procedure performed under direct supervision of Dr. Corrie Dandy and with ultrasound guidance for pleural space evaluation.     Noe Gens, NP-C [facility] Pgr: [phone] or if no answer [phone] 03/27/2016, 3:59 PM

## 2016-03-27 NOTE — Progress Notes (Signed)
[facility] PROGRESS NOTE    Progress Note  Shannon Rush  UKG:[id number] DOB: January 05, 1941 DOA: 03/10/2016 PCP: [name], MD     Brief Narrative:   Shannon Rush is an 75 y.o. female past medical history of multiple myeloma on chemotherapy presents with sepsis secondary to pneumonia on admission he was neutropenic likely due to chemotherapy he was started on vancomycin and cefepime and remain symptomatic and hypotensive and therefore was admitted to the stepdown  Assessment/Plan:   Sepsis likely due to healthcare associated pneumonia: Started empirically on admission on vancomycin and cefepime stopped on 03/14/2016, resume on 03/16/2016 as she became febrile, cultures were sent which remained negative. She was transferred back to step down due to hypertension there is no need for pressors. Blood pressure improved with stress dose steroids which was stopped. Her antibiotic regimen was Deescalated to Ceftin and doxycycline which were stopped after a two-week course, a CT scan of the chest show resolution of her pneumonia.   Acute respiratory failure with hypoxia due to healthcare associated pneumonia/pleural effusion: Respiratory cultures were done which showed normal flora. CT image of the chest was negative for PE but did show a large pleural effusion. 2-D echo was obtained that showed an EF of 45% with a grade 2 diastolic heart failure which is new. Pulmonary recommended thoracentesis as effusion has not improved. Will send for light criteria labs. She will receive platelet transfusions as her platelet counts less than 50.  New acute combined systolic and diastolic heart failure/  Cardiomyopathy-drop in EF to 45-50% etiology not yet determined: Cardiology was consulted due to the results of the 2-D echo, she was continued on her metoprolol. She relates dizziness upon standing that started yesterday will limit the use of Lasix.  Pancytopenia: Likely due to  chemotherapy. /9 per oncology did improve with Neupogen. Follow-up with oncology as an outpatient. Her hemoglobin has slowly trended down, she relates no melanotic stools or bright red per rectum. This could be due to chemotherapy will continue monitor CBC. If hemoglobin continues to trend down we'll transfuse 1 unit of packed red blood cells.  Multiple myeloma [facility]) Follow-up with oncologist as an outpatient continue current regimen.  Coronary artery Disease/  History of STEMI-Aug 2015- secondary to thrombotic state from chemo: Continue Toprol, hold Plavix given thrombocytopenia and thoracocentesis.  Protein caloric malnutrition: Continue ensure.  Hypokalemia/hypophosphatemia: Continue oral repletion.  Relative Adrenocortical insufficiency (HCC) Resolved now off steroids.  Patient on antineoplastic chemotherapy regimen   DVT prophylaxis: lovenox Family Communication:daughter Disposition Plan/Barrier to D/C: home once off oxygen. Code Status:     Code Status Orders        Start     Ordered   03/10/16 1341  Full code  Continuous     03/10/16 1341    Code Status History    Date Active Date Inactive Code Status Order ID Comments User Context   09/14/2014 11:44 PM 09/19/2014  6:20 PM Full Code [id number]  Toy Baker, MD Inpatient   08/09/2014  1:06 AM 08/13/2014  7:25 PM Full Code [id number]  Ivor Costa, MD Inpatient    Advance Directive Documentation   Flowsheet Row Most Recent Value  Type of Advance Directive  Living will, Out of facility DNR (pink MOST or yellow form)  Pre-existing out of facility DNR order (yellow form or pink MOST form)  No data  "MOST" Form in Place?  No data        IV Access:    Peripheral IV  Procedures and diagnostic studies:   Dg Chest 2 View  Result Date: 03/27/2016 CLINICAL DATA:  History of multiple myeloma, on chemotherapy. Pleural effusion. EXAM: CHEST  2 VIEW COMPARISON:  03/26/2016 FINDINGS: There are moderate bilateral  pleural effusions. Bibasilar atelectasis or infiltrates, left worse than right. Effusions have increased slightly since prior study. Heart is borderline in size. Underlying COPD. Biapical scarring. Multiple old left rib fractures and multiple severe compression fractures throughout the thoracic spine. IMPRESSION: Moderate bilateral pleural effusions, increasing since prior study. Bibasilar atelectasis or infiltrates, left greater than right. COPD. Multiple old left rib fractures and multiple severe thoracic spine compression fractures. Electronically Signed   By: Rolm Baptise M.D.   On: 03/27/2016 10:01   Dg Chest Port 1 View  Result Date: 03/26/2016 CLINICAL DATA:  76 year old female with respiratory failure. Effusions. Subsequent encounter. EXAM: PORTABLE CHEST 1 VIEW COMPARISON:  03/24/2016 CT.  03/23/2016 chest x-ray. FINDINGS: Shifting of moderate-size bilateral pleural effusions. Basilar atelectasis more notable on left. Pulmonary vascular congestion. Remote left rib fractures and left clavicle fracture. Diffuse osseous changes of myeloma. Calcified aorta. IMPRESSION: Shifting of moderate-size bilateral pleural effusions. Persistent bibasilar atelectasis. Pulmonary vascular congestion. Electronically Signed   By: Genia Del M.D.   On: 03/26/2016 07:23     Medical Consultants:    None.  Anti-Infectives:   Is completed her course of antibiotics.  Subjective:    Shannon Rush July she feels short of breath but also generalized weakness. Lost of energy.  Objective:    Vitals:   03/27/16 0523 03/27/16 0640 03/27/16 1020 03/27/16 1023  BP: 103/63     Pulse: 84     Resp:      Temp: 98.1 F (36.7 C)     TempSrc: Oral     SpO2: (!) 84% 95% 95% 95%  Weight:      Height:        Intake/Output Summary (Last 24 hours) at 03/27/16 1221 Last data filed at 03/27/16 1138  Gross per 24 hour  Intake              480 ml  Output             2650 ml  Net            -2170 ml   Filed  Weights   03/25/16 0600 03/26/16 1510 03/27/16 0521  Weight: 58 kg (127 lb 14.4 oz) 55.8 kg (123 lb 0.3 oz) 55.7 kg (122 lb 11.2 oz)    Exam: General exam: In no acute distress.Cachectic Respiratory system: Good air movement and clear to auscultation. Cardiovascular system: S1 & S2 heard, RRR. No JVD. Gastrointestinal system: Abdomen is nondistended, soft and nontender.  Central nervous system: Alert and oriented. No focal neurological deficits. Extremities: No pedal edema. Skin: No rashes, lesions or ulcers Psychiatry: Judgement and insight appear normal. Mood & affect appropriate.    Data Reviewed:    Labs: Basic Metabolic Panel:  Recent Labs Lab 03/23/16 0413 03/24/16 0358 03/25/16 0824 03/26/16 0844 03/27/16 0421  NA 140 141 140 141 142  K 3.8 4.2 3.2* 3.5 4.0  CL 114* 111 104 108 109  CO2 24 26 31 30 30   GLUCOSE 97 104* 122* 110* 90  BUN 36* 34* 33* 34* 38*  CREATININE 0.55 0.51 0.54 0.47 0.58  CALCIUM 7.4* 7.4* 7.7* 7.9* 8.0*   GFR Estimated Creatinine Clearance: 55.1 mL/min (by C-G formula based on SCr of 0.58 mg/dL). Liver Function Tests: No results for  input(s): AST, ALT, ALKPHOS, BILITOT, PROT, ALBUMIN in the last 168 hours. No results for input(s): LIPASE, AMYLASE in the last 168 hours. No results for input(s): AMMONIA in the last 168 hours. Coagulation profile No results for input(s): INR, PROTIME in the last 168 hours.  CBC:  Recent Labs Lab 03/21/16 0351 03/22/16 0410 03/23/16 0413 03/24/16 0358 03/25/16 0824 03/26/16 0844 03/27/16 0421  WBC 2.1* 2.7* 3.3* 3.6* 2.8* 2.5* 2.7*  NEUTROABS 1.3* 1.7  --   --   --  1.2*  --   HGB 8.1* 8.1* 8.6* 8.6* 8.9* 7.9* 7.4*  HCT 23.8* 23.8* 25.6* 26.0* 26.6* 23.9* 21.5*  MCV 97.5 98.3 99.6 100.8* 100.4* 100.8* 98.6  PLT 23* 27* 30* 39* 36* 35* 36*   Cardiac Enzymes: No results for input(s): CKTOTAL, CKMB, CKMBINDEX, TROPONINI in the last 168 hours. BNP (last 3 results) No results for input(s): PROBNP  in the last 8760 hours. CBG: No results for input(s): GLUCAP in the last 168 hours. D-Dimer: No results for input(s): DDIMER in the last 72 hours. Hgb A1c: No results for input(s): HGBA1C in the last 72 hours. Lipid Profile: No results for input(s): CHOL, HDL, LDLCALC, TRIG, CHOLHDL, LDLDIRECT in the last 72 hours. Thyroid function studies: No results for input(s): TSH, T4TOTAL, T3FREE, THYROIDAB in the last 72 hours.  Invalid input(s): FREET3 Anemia work up: No results for input(s): VITAMINB12, FOLATE, FERRITIN, TIBC, IRON, RETICCTPCT in the last 72 hours. Sepsis Labs:  Recent Labs Lab 03/24/16 0358 03/25/16 0824 03/26/16 0844 03/27/16 0421  WBC 3.6* 2.8* 2.5* 2.7*   Microbiology Recent Results (from the past 240 hour(s))  MRSA PCR Screening     Status: None   Collection Time: 03/19/16  9:33 AM  Result Value Ref Range Status   MRSA by PCR NEGATIVE NEGATIVE Final    Comment:        The GeneXpert MRSA Assay (FDA approved for NASAL specimens only), is one component of a comprehensive MRSA colonization surveillance program. It is not intended to diagnose MRSA infection nor to guide or monitor treatment for MRSA infections.   Culture, respiratory (NON-Expectorated)     Status: None   Collection Time: 03/21/16  9:43 PM  Result Value Ref Range Status   Specimen Description SPU  Final   Special Requests NONE  Final   Gram Stain   Final    ABUNDANT WBC PRESENT,BOTH PMN AND MONONUCLEAR ABUNDANT GRAM POSITIVE COCCI IN PAIRS IN CHAINS ABUNDANT GRAM VARIABLE ROD ABUNDANT YEAST    Culture   Final    Consistent with normal respiratory flora. Performed at [facility]    Report Status 03/24/2016 FINAL  Final  Culture, blood (x 2)     Status: None (Preliminary result)   Collection Time: 03/22/16 12:36 PM  Result Value Ref Range Status   Specimen Description BLOOD LEFT ARM  Final   Special Requests BOTTLES DRAWN AEROBIC AND ANAEROBIC [place]  Final   Culture    Final    NO GROWTH 4 DAYS Performed at [facility]    Report Status PENDING  Incomplete  Culture, blood (x 2)     Status: None (Preliminary result)   Collection Time: 03/22/16 12:44 PM  Result Value Ref Range Status   Specimen Description BLOOD RIGHT ANTECUBITAL  Final   Special Requests BOTTLES DRAWN AEROBIC AND ANAEROBIC 10CC EACH  Final   Culture   Final    NO GROWTH 4 DAYS Performed at [facility]  Report Status PENDING  Incomplete     Medications:   . atorvastatin  40 mg Oral Daily  . budesonide (PULMICORT) nebulizer solution  0.5 mg Nebulization BID  . calcium carbonate  1,250 mg Oral Daily  . feeding supplement (PRO-STAT SUGAR FREE 64)  30 mL Oral BID  . fluticasone  2 spray Each Nare Daily  . guaiFENesin  1,200 mg Oral BID  . metoprolol succinate  25 mg Oral Daily  . potassium chloride  20 mEq Oral Daily   Continuous Infusions:   Time spent: 25 min   LOS: 17 days   Charlynne Cousins  [facility] Pager [phone]  *Please refer to [email or web address], password [email or web address] to get updated schedule on who will round on this patient, as hospitalists switch teams weekly. If 7PM-7AM, please contact night-coverage at [email or web address], password [email or web address] for any overnight needs.  03/27/2016, 12:21 PM

## 2016-03-27 NOTE — Progress Notes (Addendum)
Subjective:  Struggling this am when I saw her- SOB, has to sit up  Objective:  Vital Signs in the last 24 hours: Temp:  [98.1 F (36.7 C)-98.3 F (36.8 C)] 98.1 F (36.7 C) (09/13 0523) Pulse Rate:  [80-91] 84 (09/13 0523) Resp:  [18-20] 20 (09/12 2059) BP: (100-133)/(60-77) 103/63 (09/13 0523) SpO2:  [84 %-98 %] 95 % (09/13 0640) FiO2 (%):  [2 %] 2 % (09/12 2059) Weight:  [122 lb 11.2 oz (55.7 kg)-123 lb 0.3 oz (55.8 kg)] 122 lb 11.2 oz (55.7 kg) (09/13 0521)  Intake/Output from previous day:  Intake/Output Summary (Last 24 hours) at 03/27/16 0814 Last data filed at 03/26/16 2101  Gross per 24 hour  Intake              480 ml  Output              950 ml  Net             -470 ml    Physical Exam: General appearance: alert, cooperative, cachectic and mild distress Lungs: kyphosis, decreased breath sounds 1/3 up bilaterally Heart: regular rate and rhythm Skin: cool, pale, dry Neurologic: Grossly normal   Rate: 84  Lab Results:  Recent Labs  03/26/16 0844 03/27/16 0421  WBC 2.5* 2.7*  HGB 7.9* 7.4*  PLT 35* 36*    Recent Labs  03/26/16 0844 03/27/16 0421  NA 141 142  K 3.5 4.0  CL 108 109  CO2 30 30  GLUCOSE 110* 90  BUN 34* 38*  CREATININE 0.47 0.58    Scheduled Meds: . atorvastatin  40 mg Oral Daily  . budesonide (PULMICORT) nebulizer solution  0.5 mg Nebulization BID  . calcium carbonate  1,250 mg Oral Daily  . feeding supplement (PRO-STAT SUGAR FREE 64)  30 mL Oral BID  . fluticasone  2 spray Each Nare Daily  . furosemide  40 mg Intravenous Once  . guaiFENesin  1,200 mg Oral BID  . metoprolol succinate  25 mg Oral Daily  . potassium chloride  20 mEq Oral Daily   Continuous Infusions:  PRN Meds:.acetaminophen, guaiFENesin-dextromethorphan, ipratropium-albuterol, morphine, ondansetron, prochlorperazine, zolpidem   Imaging: Dg Chest Port 1 View  Result Date: 03/26/2016 CLINICAL DATA:  75 year old female with respiratory failure.  Effusions. Subsequent encounter. EXAM: PORTABLE CHEST 1 VIEW COMPARISON:  03/24/2016 CT.  03/23/2016 chest x-ray. FINDINGS: Shifting of moderate-size bilateral pleural effusions. Basilar atelectasis more notable on left. Pulmonary vascular congestion. Remote left rib fractures and left clavicle fracture. Diffuse osseous changes of myeloma. Calcified aorta. IMPRESSION: Shifting of moderate-size bilateral pleural effusions. Persistent bibasilar atelectasis. Pulmonary vascular congestion. Electronically Signed   By: Genia Del M.D.   On: 03/26/2016 07:23    Cardiac Studies: Echo 03/24/16 Study Conclusions  - Left ventricle: The cavity size was normal. Wall thickness was   normal. Systolic function was mildly reduced. The estimated   ejection fraction was in the range of 45% to 50%. Features are   consistent with a pseudonormal left ventricular filling pattern,   with concomitant abnormal relaxation and increased filling   pressure (grade 2 diastolic dysfunction). - Mitral valve: There was mild regurgitation. - Left atrium: The atrium was mildly dilated.  Assessment/Plan:   Principal Problem:   Acute respiratory failure ([place]) Active Problems:   Multiple myeloma ([place])   Sepsis ([place])   HCAP (healthcare-associated pneumonia)   History of STEMI-Aug 2015- secondary to thrombotic state from chemo   CAD -50-60% RCA Aug 2015  Pancytopenia ([place])   Adrenocortical insufficiency (HCC)   Patient on antineoplastic chemotherapy regimen   Cardiomyopathy-drop in EF to 45-50% etiology not yet determined   Diastolic dysfunction-grade 2   Protein-calorie malnutrition (HCC)   Pleural effusion   Hypoxia   PLAN: Will give Lasix 40 mg IV now x 1.  Plts too low for pleural effusion tap.   Consider transfusion.  Kerin Ransom PA-C 03/27/2016, 8:14 AM [phone]  The patient was seen, examined and discussed with Kerin Ransom, PA-C and I agree with the above.   75 year old female with known multiple  myeloma since 2013 treated with Revlimid, recently switched to (pomalidomide, dexamethasone, daratumumab) with pancytopenia, admitted with acute hypoxic respiratory failure sec to pneumonia. She also has a h/o STEMIin August 2015 in [place] and was treated with athrombolytic. Cath showed a 50-60% RCA. It was felt this was a thrombotic event secondary to chemotherapy (Revlimid). She was on ASA, Plavix, statin and beta blocker.   During this hospital visit she was volume resuscitated for septic shock, then diuresed, now still hypoxic, pulmonary asking cardiology input for possible CHF. Her LVEF is mildly decreased from previous 55-60% in 2015, now 45-50%, previously on daratumumab, not shown to cause LVEF decrease in clinical trials. On physical exam she has decreased breathing sounds in the lung bases and minimal B/L pitting edema.  I would continue betablockers and follow her echocardiogram every 3 months if the same chemotherapy is going to be continued. However, this appears to be a prolonged course of pneumonia in a immunosuppressed patient, her BNP is 180. In addition she has a slow recovery from pancytopenia post cheo with Hb drop today, this is possibly contributing to her fatigue and desaturation. S/P thoracentesis today, 600 cc of yellow fluid. Fluid analysis and cytology pending.  We will give lasix 40 mg iv x 1 now and after each transfusion. I would strongly encourage to transfuse 2 units of blood with h/o STEMI and Hb 7.4 and dropping.   Ena Dawley 03/27/2016

## 2016-03-27 NOTE — Progress Notes (Signed)
PULMONARY / CRITICAL CARE MEDICINE   Name: Shannon Rush MRN: [id number] DOB: 1940-12-24    ADMISSION DATE:  03/10/2016 CONSULTATION DATE:  03/17/2016  REFERRING MD:  Dr. Chucky May  CHIEF COMPLAINT:  Fever  SUBJECTIVE:  Pt reports she woke this am with increased SOB, congested cough.  Felt worse this morning but has improved some as the day has progressed.   Remains in positive balance.     VITAL SIGNS: BP 103/63 (BP Location: Right Arm)   Pulse 84   Temp 98.1 F (36.7 C) (Oral)   Resp 20   Ht _0  (1.676 m)   Wt 122 lb 11.2 oz (55.7 kg)   SpO2 95%   BMI 19.80 kg/m   INTAKE / OUTPUT: I/O last 3 completed shifts: In: 700 [P.O.:700] Out: 1300 [Urine:1300]  PHYSICAL EXAMINATION: General:  Elderly female in NAD, sitting in chair Neuro:  Awake, alert, no distress HEENT:  No thyromegaly, JVD, MM pink/moist, good dentition Cardiovascular:  RRR, no MRG Lungs:  Clear, No wheeze or crackles. Diminished lower bilaterally posterior, rhonchi anterior Abdomen: soft, no masses Musculoskeletal:  Normal bulk, no deformities Skin:  No rashes or lesions, 2+ pitting pre-tibial edema BLE   LABS:  BMET  Recent Labs Lab 03/25/16 0824 03/26/16 0844 03/27/16 0421  NA 140 141 142  K 3.2* 3.5 4.0  CL 104 108 109  CO2 _1 BUN 33* 34* 38*  CREATININE 0.54 0.47 0.58  GLUCOSE 122* 110* 90    Electrolytes  Recent Labs Lab 03/25/16 0824 03/26/16 0844 03/27/16 0421  CALCIUM 7.7* 7.9* 8.0*    CBC  Recent Labs Lab 03/25/16 0824 03/26/16 0844 03/27/16 0421  WBC 2.8* 2.5* 2.7*  HGB 8.9* 7.9* 7.4*  HCT 26.6* 23.9* 21.5*  PLT 36* 35* 36*    Coag's No results for input(s): APTT, INR in the last 168 hours.  Sepsis Markers No results for input(s): LATICACIDVEN, PROCALCITON, O2SATVEN in the last 168 hours.  ABG No results for input(s): PHART, PCO2ART, PO2ART in the last 168 hours.  Liver Enzymes No results for input(s): AST, ALT, ALKPHOS, BILITOT, ALBUMIN in the  last 168 hours.  Cardiac Enzymes No results for input(s): TROPONINI, PROBNP in the last 168 hours.  Glucose No results for input(s): GLUCAP in the last 168 hours.  Imaging Dg Chest 2 View  Result Date: 03/27/2016 CLINICAL DATA:  History of multiple myeloma, on chemotherapy. Pleural effusion. EXAM: CHEST  2 VIEW COMPARISON:  03/26/2016 FINDINGS: There are moderate bilateral pleural effusions. Bibasilar atelectasis or infiltrates, left worse than right. Effusions have increased slightly since prior study. Heart is borderline in size. Underlying COPD. Biapical scarring. Multiple old left rib fractures and multiple severe compression fractures throughout the thoracic spine. IMPRESSION: Moderate bilateral pleural effusions, increasing since prior study. Bibasilar atelectasis or infiltrates, left greater than right. COPD. Multiple old left rib fractures and multiple severe thoracic spine compression fractures. Electronically Signed   By: Rolm Baptise M.D.   On: 03/27/2016 10:01     STUDIES:  CT chest 03/24/16 > B/L pleural effusions, associated atelectasis. No PE Echo 03/24/16 > Grade 2 diastolic dysfunction. LVEF 45-50%  Rt effusion 9/11    Lt effusion 9/11     CULTURES: 8/28 Sputum >> negative 8/28 Urine >> negative 8/28 Blood >> negative 9/02 Blood >> negative  ANTIBIOTICS: 8/27 Vancomycin >> 9/8 8/27 Cefepime >> 9/8 8/27 Acyclovir >> 9/8  SIGNIFICANT EVENTS: 8/27 Admit, oncology consulted 9/03 to ICU with low blood  pressure 9/11 Felt dizzy today. Orthostasis vitals checked but BP did not change on standing. Still dyspneic and hypoxic.  PCCM re consulted to evaluate pleural effusions.  LINES/TUBES:  DISCUSSION: 75 yo female with a PMH of CAD, HLD, osteoporosis, admitted with HCAP in setting of relapsed multiple myeloma on treatment with pomalidomide, dexamethasone, and daratumumab.  Slow improvement.  PCCM re-consulted 9/11 to evaluate pleural effusions as she is still  hypoxic.  Effusions examined with U/S at bedside. They are smaller than what they appear on CT. Bilateral atelectasis seen on CT scan.  If resp status declined then we can consider rt thora but would like to avoid if possible as platelets are low.  ECHO results noted with new systolic and diastolic dysfunction. I suspect the hypoxia, effusions are a result of the above, atelectasis, third spacing from malnutrition.   ASSESSMENT / PLAN:  PULMONARY A: Acute hypoxic respiratory failure - in the setting of HCAP, bilateral pleural effusions, atelectasis & decompensated dCHF Bilateral pleural effusions with atelectasis Finished treatment for HCAP P:   Oxygen to keep SpO2 > 92% Intermittent CXR  Continue flutter valve, incentive spirometer to help with atelectasis Out of bed and ambulation with supervision Continue mucinex Optimize nutrition > prostat.  40 mg IV lasix 9/13 per Cardiology. Will need home O2 arranged at discharge Continue budesonide, duonebs for now.  Doubt will need at discharge Extensive discussion with patient regarding thoracentesis risks (bleeding, infection, pneumothorax) / benefits.  As well as the possibility of performing one today and another tomorrow for symptom relief.  She would need pre-procedure platelets.  Await patient decision for moving forward.      Relative Adrenal Insufficiency - on decadron at home, hypotensive after steroids stopped, improved with restart   P: Reduce hydrocortisone to 25 mg QD, consider d/c 9/14   Noe Gens, NP-C [facility] Pgr: [phone] or if no answer [phone] 03/27/2016, 11:24 AM   Attending note: I have seen and examined the patient with nurse practitioner/resident and agree with the note. History, labs and imaging reviewed.  75 Y/O with PMH of MM on chemo, sepsis with HCAP, shock, realtive adrenal insufficiency. New systolic and diastolic cardiomyopathy B/L pleural effusions, atelectasis.  She  continues to be dyspneic.CXR today reviewed and they shows persistent pleural effusion, slightly larger.   We discussed doing a thoracentesis with the patient for symptoms relief and will proceed after transfusing platelets.   Rest of plan as above.  Marshell Garfinkel MD [facility] Pager [phone] If no answer or after 3pm call: [phone] 03/27/2016, 2:26 PM

## 2016-03-28 ENCOUNTER — Inpatient Hospital Stay (HOSPITAL_COMMUNITY): Payer: Medicare Other

## 2016-03-28 DIAGNOSIS — T451X5A Adverse effect of antineoplastic and immunosuppressive drugs, initial encounter: Secondary | ICD-10-CM

## 2016-03-28 DIAGNOSIS — R0602 Shortness of breath: Secondary | ICD-10-CM

## 2016-03-28 DIAGNOSIS — I5043 Acute on chronic combined systolic (congestive) and diastolic (congestive) heart failure: Secondary | ICD-10-CM

## 2016-03-28 DIAGNOSIS — D6481 Anemia due to antineoplastic chemotherapy: Secondary | ICD-10-CM

## 2016-03-28 LAB — BASIC METABOLIC PANEL
Anion gap: 2 — ABNORMAL LOW (ref 5–15)
BUN: 39 mg/dL — AB (ref 6–20)
CALCIUM: 8.1 mg/dL — AB (ref 8.9–10.3)
CO2: 33 mmol/L — AB (ref 22–32)
Chloride: 107 mmol/L (ref 101–111)
Creatinine, Ser: 0.52 mg/dL (ref 0.44–1.00)
GFR calc Af Amer: >60 mL/min (ref >60)
GLUCOSE: 98 mg/dL (ref 65–99)
Potassium: 3.8 mmol/L (ref 3.5–5.1)
Sodium: 142 mmol/L (ref 135–145)

## 2016-03-28 LAB — ALBUMIN, FLUID (OTHER): Albumin, Fluid: 1.1 g/dL

## 2016-03-28 LAB — CBC
HCT: 19.2 % — ABNORMAL LOW (ref 36.0–46.0)
HEMOGLOBIN: 6.5 g/dL — AB (ref 12.0–15.0)
MCH: 34.2 pg — AB (ref 26.0–34.0)
MCHC: 33.9 g/dL (ref 30.0–36.0)
MCV: 101.1 fL — ABNORMAL HIGH (ref 78.0–100.0)
Platelets: 54 10*3/uL — ABNORMAL LOW (ref 150–400)
RBC: 1.9 MIL/uL — ABNORMAL LOW (ref 3.87–5.11)
RDW: 15.6 % — AB (ref 11.5–15.5)
WBC: 2.9 10*3/uL — ABNORMAL LOW (ref 4.0–10.5)

## 2016-03-28 LAB — LACTATE DEHYDROGENASE, PLEURAL OR PERITONEAL FLUID: LD FL: 77 U/L — AB (ref 3–23)

## 2016-03-28 LAB — PREPARE PLATELET PHERESIS: Unit division: 0

## 2016-03-28 LAB — PROTEIN, BODY FLUID: Total protein, fluid: <3 g/dL

## 2016-03-28 LAB — ALBUMIN: ALBUMIN: 2.3 g/dL — AB (ref 3.5–5.0)

## 2016-03-28 LAB — BODY FLUID CELL COUNT WITH DIFFERENTIAL
Eos, Fluid: 0 %
LYMPHS FL: 85 %
Monocyte-Macrophage-Serous Fluid: 2 % — ABNORMAL LOW (ref 50–90)
NEUTROPHIL FLUID: 13 % (ref 0–25)
Total Nucleated Cell Count, Fluid: 181 cu mm (ref 0–1000)

## 2016-03-28 LAB — PREPARE RBC (CROSSMATCH)

## 2016-03-28 LAB — PROTEIN, TOTAL: Total Protein: 4.2 g/dL — ABNORMAL LOW (ref 6.5–8.1)

## 2016-03-28 MED ORDER — FUROSEMIDE 10 MG/ML IJ SOLN: 40.0000 mg | Freq: Once | INTRAMUSCULAR | Status: DC

## 2016-03-28 MED ORDER — SODIUM CHLORIDE 0.9 % IV SOLN: Freq: Once | INTRAVENOUS | Status: DC

## 2016-03-28 MED ORDER — FUROSEMIDE 10 MG/ML IJ SOLN
20.0000 mg | Freq: Two times a day (BID) | INTRAMUSCULAR | Status: DC
  Administered 2016-03-28: 20 mg via INTRAVENOUS
  Filled 2016-03-28 (×2): qty 2

## 2016-03-28 NOTE — Progress Notes (Signed)
Patient Name: Shannon Rush Date of Encounter: 03/28/2016  Principal Problem:   Acute respiratory failure [facility]) Active Problems:   Multiple myeloma ([place])   History of STEMI-Aug 2015- secondary to thrombotic state from chemo   CAD -50-60% RCA Aug 2015   Sepsis ([place])   Pancytopenia ([place])   Protein-calorie malnutrition ([place])   HCAP (healthcare-associated pneumonia)   Adrenocortical insufficiency ([place])   Patient on antineoplastic chemotherapy regimen   Cardiomyopathy-drop in EF to 45-50% etiology not yet determined   Pleural effusion   Hypoxia   Diastolic dysfunction-grade 2   Primary Cardiologist: Dr Johnsie Cancel Patient Profile: 75 yo w/ hx STEMI ([place]) 2015 w/ thrombolytics (RCA 50-60%) 2nd chemo, mult myeloma, osteoporosis, HLD. Admitted 08/27 w/ HCAP, cards saw 09/11 for CHF, pleural effusions s/p tap 600 cc  SUBJECTIVE: Breathing better, hopes to be transfused +/- tap L lung and go home.  OBJECTIVE Vitals:   03/27/16 1448 03/27/16 1615 03/27/16 2039 03/28/16 0535  BP: 102/62 91/61 111/70 (!) 93/55  Pulse: 86 92 80 90  Resp: 18 18 16 16   Temp: 98.1 F (36.7 C) 97.9 F (36.6 C) 98 F (36.7 C) 98.2 F (36.8 C)  TempSrc: Oral Oral Oral Oral  SpO2: 93% 100% 99% 90%  Weight:      Height:        Intake/Output Summary (Last 24 hours) at 03/28/16 0754 Last data filed at 03/28/16 0539  Gross per 24 hour  Intake             1275 ml  Output             2800 ml  Net            -1525 ml   Filed Weights   03/25/16 0600 03/26/16 1510 03/27/16 0521  Weight: 127 lb 14.4 oz (58 kg) 123 lb 0.3 oz (55.8 kg) 122 lb 11.2 oz (55.7 kg)    PHYSICAL EXAM General: Well developed, well nourished, female in no acute distress. Head: Normocephalic, atraumatic.  Neck: Supple without bruits, JVD not seen  Lungs:  Resp regular and unlabored, R side improved, L w/ decreased BS bases. Heart: RRR, S1, S2, no S3, S4, or murmur; no rub. Abdomen: Soft, non-tender, non-distended, BS + x 4.    Extremities: No clubbing, cyanosis, edema.  Neuro: Alert and oriented X 3. Moves all extremities spontaneously. Psych: Normal affect.  LABS: CBC: Recent Labs  03/26/16 0844 03/27/16 0421 03/28/16 0338  WBC 2.5* 2.7* 2.9*  NEUTROABS 1.2*  --   --   HGB 7.9* 7.4* 6.5*  HCT 23.9* 21.5* 19.2*  MCV 100.8* 98.6 101.1*  PLT 35* 36* 54*   Basic Metabolic Panel: Recent Labs  03/27/16 0421 03/28/16 0338  NA 142 142  K 4.0 3.8  CL 109 107  CO2 30 33*  GLUCOSE 90 98  BUN 38* 39*  CREATININE 0.58 0.52  CALCIUM 8.0* 8.1*   Liver Function Tests: Recent Labs  03/27/16 1649  PROT 4.4*   BNP:  B Natriuretic Peptide  Date/Time Value Ref Range Status  03/25/2016 01:14 PM 188.9 (H) 0.0 - 100.0 pg/mL Final   Radiology/Studies: Dg Chest 2 View Result Date: 03/27/2016 CLINICAL DATA:  History of multiple myeloma, on chemotherapy. Pleural effusion. EXAM: CHEST  2 VIEW COMPARISON:  03/26/2016 FINDINGS: There are moderate bilateral pleural effusions. Bibasilar atelectasis or infiltrates, left worse than right. Effusions have increased slightly since prior study. Heart is borderline in size. Underlying COPD. Biapical scarring. Multiple  old left rib fractures and multiple severe compression fractures throughout the thoracic spine. IMPRESSION: Moderate bilateral pleural effusions, increasing since prior study. Bibasilar atelectasis or infiltrates, left greater than right. COPD. Multiple old left rib fractures and multiple severe thoracic spine compression fractures. Electronically Signed   By: Rolm Baptise M.D.   On: 03/27/2016 10:01   Dg Chest Port 1 View Result Date: 03/27/2016 CLINICAL DATA:  Followup right thoracentesis. EXAM: PORTABLE CHEST 1 VIEW COMPARISON:  Earlier same day FINDINGS: Much less pleural fluid on the right. No pneumothorax. Improved aeration at the right lung base. Persistent left effusion with left lower lobe volume loss. Chronic healed fractures on the left. IMPRESSION:  Status post right thoracentesis. Much less pleural fluid on the right. No pneumothorax. Better aeration of the right lower lobe. Persistent left effusion and left lower lobe volume loss. Electronically Signed   By: Milla Wahlberg Chimes M.D.   On: 03/27/2016 16:15   Current Medications:  . sodium chloride   Intravenous Once  . atorvastatin  40 mg Oral Daily  . budesonide (PULMICORT) nebulizer solution  0.5 mg Nebulization BID  . calcium carbonate  1,250 mg Oral Daily  . feeding supplement (PRO-STAT SUGAR FREE 64)  30 mL Oral BID  . filgrastim  480 mcg Subcutaneous QPC supper  . fluticasone  2 spray Each Nare Daily  . guaiFENesin  1,200 mg Oral BID  . metoprolol succinate  25 mg Oral Daily  . potassium chloride  20 mEq Oral Daily      ASSESSMENT AND PLAN: 75 year old female with known multiple myeloma since 2013 treated with Revlimid, recently switched to (pomalidomide, dexamethasone, daratumumab) with pancytopenia, admitted with acute hypoxic respiratory failure sec to pneumonia. She also has a h/o STEMIin August 2015 in [place] rx with athrombolytic. Cath w/ 50-60% RCA. Felt a thrombotic event secondary to chemotherapy (Revlimid). She was on ASA, Plavix, statin and beta blocker.   Principal Problem:   Acute respiratory failure (HCC) - EF now 45-50% possibly secondary to daratumumab w/ grade 2 dd - CXR w/ pleural effusions, s/p 600 cc out 09/13  - protein 4.4, LDH 130, LD 88, mesothelial cells w/ WBCs 210 (28% monocytes) - management per CCM  Otherwise, per IM/CCM/Oncology; pt for transfusion today; will give Lasix 40 mg after the unit of blood. Active Problems:   Multiple myeloma ([place])   History of STEMI-Aug 2015- secondary to thrombotic state from chemo   CAD -50-60% RCA Aug 2015   Sepsis ([place])   Pancytopenia ([place])   Protein-calorie malnutrition ([place])   HCAP (healthcare-associated pneumonia)   Adrenocortical insufficiency ([place])   Patient on antineoplastic chemotherapy regimen    Cardiomyopathy-drop in EF to 45-50% etiology not yet determined   Pleural effusion   Hypoxia   Diastolic dysfunction-grade 2  Signed, Barrett, [address] 7:54 AM 03/28/2016   The patient was seen, examined and discussed with Rosaria Ferries, PA-C and I agree with the above.   75 year old female with known multiple myeloma since 2013 treated with Revlimid, recently switched to (pomalidomide, dexamethasone, daratumumab) with pancytopenia, admitted with acute hypoxic respiratory failure sec to pneumonia. She also has a h/o STEMIin August 2015 in [place] and was treated with athrombolytic. Cath showed a 50-60% RCA. It was felt this was a thrombotic event secondary to chemotherapy (Revlimid). She was on ASA, Plavix, statin and beta blocker.   During this hospital visit she was volume resuscitated for septic shock, then diuresed, now still hypoxic, pulmonary asking cardiology  input for possible CHF. Her LVEF is mildly decreased from previous 55-60% in 2015, now 45-50%, previously on daratumumab, not shown to cause LVEF decrease in clinical trials. On physical exam she has decreased breathing sounds in the lung bases and minimal B/L pitting edema.  I would continue betablockers and follow her echocardiogram every 3 months if the same chemotherapy is going to be continued. However, this appears to be a prolonged course of pneumonia in a immunosuppressed patient, her BNP is 180. In addition she has a slow recovery from pancytopenia post cheo with Hb drop today, this is possibly contributing to her fatigue and desaturation. S/P thoracentesis yesterday on the right and today on the left with removal of 600 cc of yellow fluid x2. Fluid analysis and cytology pending.  She is getting 2 units of red blood cells transfusion, we will give lasix 20 mg iv after each.    Ena Dawley 03/28/2016

## 2016-03-28 NOTE — Procedures (Signed)
Thoracentesis Procedure Note  Pre-operative Diagnosis: Right Pleural Effusion   Post-operative Diagnosis: same  Indications: Diagnostic evaluation of pleural fluid and therapeutic relief of dyspnea  Procedure Details  Consent: Informed consent was obtained. Risks of the procedure were discussed including: infection, bleeding, pain, pneumothorax.  Under sterile conditions the patient was positioned. Betadine solution and sterile drapes were utilized.  1% plain lidocaine was used to anesthetize the 8th rib space. Fluid was obtained without any difficulties and minimal blood loss.  A dressing was applied to the wound and wound care instructions were provided.   Findings 600 ml of clear pleural fluid was obtained. A sample was sent to pathology, cell counts, as well as for infection analysis.  Complications:  None; patient tolerated the procedure well.          Condition: stable  Plan A follow up chest x-ray was ordered.  Procedure performed under direct supervision of Dr. Vaughan Browner and with ultrasound guidance.  Noe Gens, NP-C [facility] Pgr: [phone] or if no answer [phone] 03/28/2016, 11:51 AM

## 2016-03-28 NOTE — Progress Notes (Signed)
   03/28/16 1600  Clinical Encounter Type  Visited With Patient  Visit Type Initial;Psychological support  Counseling intern visited with patient at chaplain's recommendation due to patient's length of stay. Patient appeared alert, oriented, and in bright spirits. She spoke pleasantly and with a strong voice throughout the visit. Patient indicated that she felt emotionally supported by her husband, neighbors, and friends who come to visit her. Patient acknowledged that she was not pleased with having been in the hospital for such a long time but seemed resigned to the necessity of her treatment. Patient stated that she was looking forward to going home in the next 24 to 48 hours and that she was most eager to see her pet puppy. 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 Patient described her interest in civil rights activism and gave intern resources to research.   Lamount Cohen, Counseling Intern Department of Spiritual Care and [facility] Supervisor - [address] [place], [place]

## 2016-03-28 NOTE — Progress Notes (Signed)
PULMONARY / CRITICAL CARE MEDICINE   Name: Shannon Rush MRN: [id number] DOB: 09-Feb-1941    ADMISSION DATE:  03/10/2016 CONSULTATION DATE:  03/17/2016  REFERRING MD:  Dr. Chucky May  CHIEF COMPLAINT:  Fever  SUBJECTIVE:  Pt reports she feels better "in some ways".  No acute distress.  Less cough.  Hopeful to go home soon.  Pending blood transfusion.     VITAL SIGNS: BP (!) 86/56 (BP Location: Right Arm)   Pulse 83   Temp 98.2 F (36.8 C) (Oral)   Resp 16   Ht _0  (1.676 m)   Wt 116 lb 6.5 oz (52.8 kg)   SpO2 95%   BMI 18.79 kg/m   INTAKE / OUTPUT: I/O last 3 completed shifts: In: 7001 [P.O.:1075; Blood:200] Out: 3050 [Urine:3050]  PHYSICAL EXAMINATION: General:  Elderly female in NAD, sitting in bed Neuro:  Awake, alert, no distress HEENT:  No thyromegaly, JVD, MM pink/moist, good dentition Cardiovascular:  RRR, no MRG Lungs:  Clear, No wheeze or crackles. Diminished left lower posterior Abdomen: soft, no masses Musculoskeletal:  Normal bulk, no deformities Skin:  No rashes or lesions, 1+ pitting pre-tibial edema BLE   LABS:  BMET  Recent Labs Lab 03/26/16 0844 03/27/16 0421 03/28/16 0338  NA 141 142 142  K 3.5 4.0 3.8  CL 108 109 107  CO2 30 30 33*  BUN 34* 38* 39*  CREATININE 0.47 0.58 0.52  GLUCOSE 110* 90 98    Electrolytes  Recent Labs Lab 03/26/16 0844 03/27/16 0421 03/28/16 0338  CALCIUM 7.9* 8.0* 8.1*    CBC  Recent Labs Lab 03/26/16 0844 03/27/16 0421 03/28/16 0338  WBC 2.5* 2.7* 2.9*  HGB 7.9* 7.4* 6.5*  HCT 23.9* 21.5* 19.2*  PLT 35* 36* 54*    Coag's No results for input(s): APTT, INR in the last 168 hours.  Sepsis Markers No results for input(s): LATICACIDVEN, PROCALCITON, O2SATVEN in the last 168 hours.  ABG No results for input(s): PHART, PCO2ART, PO2ART in the last 168 hours.  Liver Enzymes No results for input(s): AST, ALT, ALKPHOS, BILITOT, ALBUMIN in the last 168 hours.  Cardiac Enzymes No results for  input(s): TROPONINI, PROBNP in the last 168 hours.  Glucose No results for input(s): GLUCAP in the last 168 hours.  Imaging Dg Chest Port 1 View  Result Date: 03/27/2016 CLINICAL DATA:  Followup right thoracentesis. EXAM: PORTABLE CHEST 1 VIEW COMPARISON:  Earlier same day FINDINGS: Much less pleural fluid on the right. No pneumothorax. Improved aeration at the right lung base. Persistent left effusion with left lower lobe volume loss. Chronic healed fractures on the left. IMPRESSION: Status post right thoracentesis. Much less pleural fluid on the right. No pneumothorax. Better aeration of the right lower lobe. Persistent left effusion and left lower lobe volume loss. Electronically Signed   By: Nelson Chimes M.D.   On: 03/27/2016 16:15     STUDIES:  CT chest 03/24/16 > B/L pleural effusions, associated atelectasis. No PE Echo 03/24/16 > Grade 2 diastolic dysfunction. LVEF 45-50%  Rt effusion 9/11    Lt effusion 9/11     CULTURES: 8/28 Sputum >> negative 8/28 Urine >> negative 8/28 Blood >> negative 9/02 Blood >> negative  ANTIBIOTICS: 8/27 Vancomycin >> 9/8 8/27 Cefepime >> 9/8 8/27 Acyclovir >> 9/8  SIGNIFICANT EVENTS: 8/27 Admit, oncology consulted 9/03 to ICU with low blood pressure 9/11 Felt dizzy today. Orthostasis vitals checked but BP did not change on standing. Still dyspneic and hypoxic.  PCCM  re consulted to evaluate pleural effusions. 9/13  Right Thoracentesis  9/14  Left Thoracentesis   LINES/TUBES:  DISCUSSION: 75 yo female with a PMH of CAD, HLD, osteoporosis, admitted with HCAP in setting of relapsed multiple myeloma on treatment with pomalidomide, dexamethasone, and daratumumab.  Slow improvement.  PCCM re-consulted 9/11 to evaluate pleural effusions as she is still hypoxic.  Effusions examined with U/S at bedside. They are smaller than what they appear on CT. Bilateral atelectasis seen on CT scan.  If resp status declined then we can consider rt thora  but would like to avoid if possible as platelets are low.  ECHO results noted with new systolic and diastolic dysfunction. I suspect the hypoxia, effusions are a result of the above, atelectasis, third spacing from malnutrition.   ASSESSMENT / PLAN:  PULMONARY A: Acute hypoxic respiratory failure - in the setting of HCAP, bilateral pleural effusions, atelectasis & decompensated dCHF Bilateral pleural effusions with atelectasis - s/p R thora 9/13 with 600 ml, L thora 9/14 with 600 ml removed Finished treatment for HCAP P:   Oxygen to keep SpO2 > 92% Intermittent CXR  Continue flutter valve, incentive spirometer to help with atelectasis Out of bed and ambulation with supervision Continue mucinex Optimize nutrition > prostat.  Will need ambulatory O2 assessment prior discharge Continue budesonide, duonebs for now.  Doubt will need at discharge Plan for Left Thoracentesis 9/14.  Tolerated Right Thora well 9/13 with improvement in film   Relative Adrenal Insufficiency - on decadron at home, hypotensive after steroids stopped, improved with restart   P: D/c stress dose steroids, monitor BP closely   Anemia   P: Pending PRBC per Heme  Soft BP - suspect related to diuresis  P:  Monitor, likely to improve post administration of PRBC's  Noe Gens, NP-C [facility] Pgr: [phone] or if no answer [phone] 03/28/2016, 11:45 AM   Attending note: I have seen and examined the patient with nurse practitioner/resident om 9/14 and agree with the note. History, labs and imaging reviewed.  75 Y/O with PMH of MM on chemo, sepsis with HCAP, shock, realtive adrenal insufficiency. New systolic and diastolic cardiomyopathy B/L pleural effusions, atelectasis.  Feels better today after rt thoracentesis. Proceed with left tap today  Rest of plan as above.  Marshell Garfinkel MD [facility] Pager [phone] If no answer or after 3pm call:  [phone] 03/29/2016, 9:02 AM

## 2016-03-28 NOTE — Progress Notes (Signed)
[facility] PROGRESS NOTE    Progress Note  [name]  RCV:[id number] DOB: 1940-12-24 DOA: 03/10/2016 PCP: [name], MD     Brief Narrative:   Shannon Rush is an 75 y.o. female past medical history of multiple myeloma on chemotherapy presents with sepsis secondary to pneumonia on admission he was neutropenic likely due to chemotherapy he was started on vancomycin and cefepime and remain symptomatic and hypotensive and therefore was admitted to the stepdown  Assessment/Plan:   Sepsis likely due to healthcare associated pneumonia: Her antibiotic regimen was Deescalated to Ceftin and doxycycline which were stopped after a two-week course, a CT scan of the chest show resolution of her pneumonia.   Acute respiratory failure with hypoxia due to healthcare associated pneumonia/pleural effusion: Respiratory cultures were done which showed normal flora. CT image of the chest was negative for PE but did show a large pleural effusion. 2-D echo was obtained that showed an new decrease EF of 45% with a grade 2 diastolic heart failure. Status post right and left thoracocentesis with improvement in dyspnea. Will send for light criteria labs.  New acute combined systolic and diastolic heart failure/  Cardiomyopathy-drop in EF to 45-50% etiology not yet determined: Cardiology was consulted due to the results of the 2-D echo, she was continued on her metoprolol. She will get 1 unit of packed red blood cells will check orthostatic after blood transfusion.  Chemotherapy-induced Pancytopenia/antineoplastic-induced anemia: Likely due to chemotherapy. We'll need to repeat Neupogen and check CBC with differential. Follow-up with oncology as an outpatient. She will recieve one unit of packed red blood cells will check orthostasis 2 hours after blood transfusion.  Multiple myeloma [facility]) Follow-up with oncologist as an outpatient continue current regimen.  Coronary artery Disease/   History of STEMI-Aug 2015- secondary to thrombotic state from chemo: Continue Toprol, hold Plavix given thrombocytopenia and thoracocentesis.  Protein caloric malnutrition: Continue ensure.  Hypokalemia/hypophosphatemia: Continue oral repletion.  Relative Adrenocortical insufficiency (HCC) Resolved now off steroids.  Patient on antineoplastic chemotherapy regimen   DVT prophylaxis: lovenox Family Communication:daughter Disposition Plan/Barrier to D/C: home once off oxygen. Code Status:     Code Status Orders        Start     Ordered   03/10/16 1341  Full code  Continuous     03/10/16 1341    Code Status History    Date Active Date Inactive Code Status Order ID Comments User Context   09/14/2014 11:44 PM 09/19/2014  6:20 PM Full Code [id number]  Toy Baker, MD Inpatient   08/09/2014  1:06 AM 08/13/2014  7:25 PM Full Code [id number]  Ivor Costa, MD Inpatient    Advance Directive Documentation   Flowsheet Row Most Recent Value  Type of Advance Directive  Living will, Out of facility DNR (pink MOST or yellow form)  Pre-existing out of facility DNR order (yellow form or pink MOST form)  No data  "MOST" Form in Place?  No data        IV Access:    Peripheral IV   Procedures and diagnostic studies:   Dg Chest 2 View  Result Date: 03/27/2016 CLINICAL DATA:  History of multiple myeloma, on chemotherapy. Pleural effusion. EXAM: CHEST  2 VIEW COMPARISON:  03/26/2016 FINDINGS: There are moderate bilateral pleural effusions. Bibasilar atelectasis or infiltrates, left worse than right. Effusions have increased slightly since prior study. Heart is borderline in size. Underlying COPD. Biapical scarring. Multiple old left rib fractures and multiple severe compression fractures throughout  the thoracic spine. IMPRESSION: Moderate bilateral pleural effusions, increasing since prior study. Bibasilar atelectasis or infiltrates, left greater than right. COPD. Multiple old left rib  fractures and multiple severe thoracic spine compression fractures. Electronically Signed   By: Rolm Baptise M.D.   On: 03/27/2016 10:01   Dg Chest Port 1 View  Result Date: 03/27/2016 CLINICAL DATA:  Followup right thoracentesis. EXAM: PORTABLE CHEST 1 VIEW COMPARISON:  Earlier same day FINDINGS: Much less pleural fluid on the right. No pneumothorax. Improved aeration at the right lung base. Persistent left effusion with left lower lobe volume loss. Chronic healed fractures on the left. IMPRESSION: Status post right thoracentesis. Much less pleural fluid on the right. No pneumothorax. Better aeration of the right lower lobe. Persistent left effusion and left lower lobe volume loss. Electronically Signed   By: Nelson Chimes M.D.   On: 03/27/2016 16:15     Medical Consultants:    None.  Anti-Infectives:   Is completed her course of antibiotics.  Subjective:    Karel Jarvis she relates her breathing is much better compared to yesterday.  Objective:    Vitals:   03/28/16 0535 03/28/16 0949 03/28/16 1014 03/28/16 1100  BP: (!) 93/55   (!) 86/56  Pulse: 90   83  Resp: 16     Temp: 98.2 F (36.8 C)     TempSrc: Oral     SpO2: 90% 95%    Weight:   52.8 kg (116 lb 6.5 oz)   Height:        Intake/Output Summary (Last 24 hours) at 03/28/16 1134 Last data filed at 03/28/16 0937  Gross per 24 hour  Intake             1395 ml  Output             2200 ml  Net             -805 ml   Filed Weights   03/26/16 1510 03/27/16 0521 03/28/16 1014  Weight: 55.8 kg (123 lb 0.3 oz) 55.7 kg (122 lb 11.2 oz) 52.8 kg (116 lb 6.5 oz)    Exam: General exam: In no acute distress.Cachectic Respiratory system: Good air movement and clear to auscultation. Cardiovascular system: S1 & S2 heard, RRR. No JVD. Gastrointestinal system: Abdomen is nondistended, soft and nontender.  Central nervous system: Alert and oriented. No focal neurological deficits. Extremities: No pedal edema. Skin: No  rashes, lesions or ulcers Psychiatry: Judgement and insight appear normal. Mood & affect appropriate.    Data Reviewed:    Labs: Basic Metabolic Panel:  Recent Labs Lab 03/24/16 0358 03/25/16 0824 03/26/16 0844 03/27/16 0421 03/28/16 0338  NA 141 140 141 142 142  K 4.2 3.2* 3.5 4.0 3.8  CL 111 104 108 109 107  CO2 26 31 30 30  33*  GLUCOSE 104* 122* 110* 90 98  BUN 34* 33* 34* 38* 39*  CREATININE 0.51 0.54 0.47 0.58 0.52  CALCIUM 7.4* 7.7* 7.9* 8.0* 8.1*   GFR Estimated Creatinine Clearance: 52.2 mL/min (by C-G formula based on SCr of 0.52 mg/dL). Liver Function Tests:  Recent Labs Lab 03/27/16 1649  PROT 4.4*   No results for input(s): LIPASE, AMYLASE in the last 168 hours. No results for input(s): AMMONIA in the last 168 hours. Coagulation profile No results for input(s): INR, PROTIME in the last 168 hours.  CBC:  Recent Labs Lab 03/22/16 0410  03/24/16 5597 03/25/16 4163 03/26/16 8453 03/27/16 0421 03/28/16 6468  WBC 2.7*  < > 3.6* 2.8* 2.5* 2.7* 2.9*  NEUTROABS 1.7  --   --   --  1.2*  --   --   HGB 8.1*  < > 8.6* 8.9* 7.9* 7.4* 6.5*  HCT 23.8*  < > 26.0* 26.6* 23.9* 21.5* 19.2*  MCV 98.3  < > 100.8* 100.4* 100.8* 98.6 101.1*  PLT 27*  < > 39* 36* 35* 36* 54*  < > = values in this interval not displayed. Cardiac Enzymes: No results for input(s): CKTOTAL, CKMB, CKMBINDEX, TROPONINI in the last 168 hours. BNP (last 3 results) No results for input(s): PROBNP in the last 8760 hours. CBG: No results for input(s): GLUCAP in the last 168 hours. D-Dimer: No results for input(s): DDIMER in the last 72 hours. Hgb A1c: No results for input(s): HGBA1C in the last 72 hours. Lipid Profile: No results for input(s): CHOL, HDL, LDLCALC, TRIG, CHOLHDL, LDLDIRECT in the last 72 hours. Thyroid function studies: No results for input(s): TSH, T4TOTAL, T3FREE, THYROIDAB in the last 72 hours.  Invalid input(s): FREET3 Anemia work up: No results for input(s):  VITAMINB12, FOLATE, FERRITIN, TIBC, IRON, RETICCTPCT in the last 72 hours. Sepsis Labs:  Recent Labs Lab 03/25/16 0824 03/26/16 0844 03/27/16 0421 03/28/16 0338  WBC 2.8* 2.5* 2.7* 2.9*   Microbiology Recent Results (from the past 240 hour(s))  MRSA PCR Screening     Status: None   Collection Time: 03/19/16  9:33 AM  Result Value Ref Range Status   MRSA by PCR NEGATIVE NEGATIVE Final    Comment:        The GeneXpert MRSA Assay (FDA approved for NASAL specimens only), is one component of a comprehensive MRSA colonization surveillance program. It is not intended to diagnose MRSA infection nor to guide or monitor treatment for MRSA infections.   Culture, respiratory (NON-Expectorated)     Status: None   Collection Time: 03/21/16  9:43 PM  Result Value Ref Range Status   Specimen Description SPU  Final   Special Requests NONE  Final   Gram Stain   Final    ABUNDANT WBC PRESENT,BOTH PMN AND MONONUCLEAR ABUNDANT GRAM POSITIVE COCCI IN PAIRS IN CHAINS ABUNDANT GRAM VARIABLE ROD ABUNDANT YEAST    Culture   Final    Consistent with normal respiratory flora. Performed at [name]    Report Status 03/24/2016 FINAL  Final  Culture, blood (x 2)     Status: None   Collection Time: 03/22/16 12:36 PM  Result Value Ref Range Status   Specimen Description BLOOD LEFT ARM  Final   Special Requests BOTTLES DRAWN AEROBIC AND ANAEROBIC [name]  Final   Culture   Final    NO GROWTH 5 DAYS Performed at [facility]    Report Status 03/27/2016 FINAL  Final  Culture, blood (x 2)     Status: None   Collection Time: 03/22/16 12:44 PM  Result Value Ref Range Status   Specimen Description BLOOD RIGHT ANTECUBITAL  Final   Special Requests BOTTLES DRAWN AEROBIC AND ANAEROBIC 10CC EACH  Final   Culture   Final    NO GROWTH 5 DAYS Performed at [facility]    Report Status 03/27/2016 FINAL  Final  Body fluid culture     Status: None (Preliminary result)    Collection Time: 03/27/16  3:40 PM  Result Value Ref Range Status   Specimen Description PLEURAL  Final   Special Requests NONE  Final   Gram  Stain   Final    RARE WBC PRESENT, PREDOMINANTLY PMN NO ORGANISMS SEEN MICROSCOPIC FINDINGS SUGGEST THAT THIS SPECIMEN IS NOT REPRESENTATIVE OF LOWER RESPIRATORY SECRETIONS. PLEASE RECOLLECT.    Culture   Final    NO GROWTH < 24 HOURS Performed at [facility]    Report Status PENDING  Incomplete     Medications:   . sodium chloride   Intravenous Once  . atorvastatin  40 mg Oral Daily  . budesonide (PULMICORT) nebulizer solution  0.5 mg Nebulization BID  . calcium carbonate  1,250 mg Oral Daily  . feeding supplement (PRO-STAT SUGAR FREE 64)  30 mL Oral BID  . filgrastim  480 mcg Subcutaneous QPC supper  . fluticasone  2 spray Each Nare Daily  . furosemide  40 mg Intravenous Once  . guaiFENesin  1,200 mg Oral BID  . metoprolol succinate  25 mg Oral Daily  . potassium chloride  20 mEq Oral Daily   Continuous Infusions:   Time spent: 25 min   LOS: 18 days   Charlynne Cousins  [facility] Pager [phone]  *Please refer to [email or web address], password [email or web address] to get updated schedule on who will round on this patient, as hospitalists switch teams weekly. If 7PM-7AM, please contact night-coverage at [email or web address], password [email or web address] for any overnight needs.  03/28/2016, 11:34 AM

## 2016-03-28 NOTE — Progress Notes (Signed)
Shannon Rush   DOB:13-Mar-1941   YI#:[id number]   [phone]  Hematology and Oncology follow up note  Subjective: Shannon Rush had thoracentisis yesterday and today, with plt transfusion before procedure yesterday. She also has been on higher dose Lasix, her blood pressure has been borderline in to since yesterday, feels dizzy sometimes. She feels that her short of breath has improved some. Hb dropped to 6.5 this morning, no clinical signs of bleeding   Objective:  Vitals:   03/28/16 1400 03/28/16 1627  BP: (!) 91/57 (!) 86/50  Pulse: 78 84  Resp: 20 20  Temp: 98.4 F (36.9 C) 98.3 F (36.8 C)    Body mass index is 18.79 kg/m.  Intake/Output Summary (Last 24 hours) at 03/28/16 2059 Last data filed at 03/28/16 1922  Gross per 24 hour  Intake              569 ml  Output             1200 ml  Net             -631 ml     Sclerae unicteric  Oropharynx clear  No peripheral adenopathy  Lungs clear -- no rales or rhonchi, decreased breath sound on bilateral lung bases.  Heart regular rate and rhythm  Abdomen benign  MSK no focal spinal tenderness, no peripheral edema  Neuro nonfocal    CBG (last 3)  No results for input(s): GLUCAP in the last 72 hours.   Labs:  Lab Results  Component Value Date   WBC 2.9 (L) 03/28/2016   HGB 6.5 (LL) 03/28/2016   HCT 19.2 (L) 03/28/2016   MCV 101.1 (H) 03/28/2016   PLT 54 (L) 03/28/2016   NEUTROABS 1.2 (L) 03/26/2016    CMP Latest Ref Rng & Units 03/28/2016 03/27/2016 03/26/2016  Glucose 65 - 99 mg/dL 98 90 110(H)  BUN 6 - 20 mg/dL 39(H) 38(H) 34(H)  Creatinine 0.44 - 1.00 mg/dL 0.52 0.58 0.47  Sodium 135 - 145 mmol/L 142 142 141  Potassium 3.5 - 5.1 mmol/L 3.8 4.0 3.5  Chloride 101 - 111 mmol/L 107 109 108  CO2 22 - 32 mmol/L 33(H) 30 30  Calcium 8.9 - 10.3 mg/dL 8.1(L) 8.0(L) 7.9(L)  Total Protein 6.5 - 8.1 g/dL 4.2(L) 4.4(L) -  Total Bilirubin 0.3 - 1.2 mg/dL - - -  Alkaline Phos 38 - 126 U/L - - -  AST 15 - 41 U/L - - -  ALT 14 -  54 U/L - - -     Urine Studies No results for input(s): UHGB, CRYS in the last 72 hours.  Invalid input(s): UACOL, UAPR, USPG, UPH, UTP, UGL, UKET, UBIL, UNIT, UROB, [place], UEPI, UWBC, Duwayne Heck [place], [place]  Basic Metabolic Panel:  Recent Labs Lab 03/24/16 0358 03/25/16 0824 03/26/16 0844 03/27/16 0421 03/28/16 0338  NA 141 140 141 142 142  K 4.2 3.2* 3.5 4.0 3.8  CL 111 104 108 109 107  CO2 _0 33*  GLUCOSE 104* 122* 110* 90 98  BUN 34* 33* 34* 38* 39*  CREATININE 0.51 0.54 0.47 0.58 0.52  CALCIUM 7.4* 7.7* 7.9* 8.0* 8.1*   GFR Estimated Creatinine Clearance: 52.2 mL/min (by C-G formula based on SCr of 0.52 mg/dL). Liver Function Tests:  Recent Labs Lab 03/27/16 1649 03/28/16 0338  PROT 4.4* 4.2*  ALBUMIN  --  2.3*   No results for input(s): LIPASE, AMYLASE in the last 168 hours. No results for input(s):  AMMONIA in the last 168 hours. Coagulation profile No results for input(s): INR, PROTIME in the last 168 hours.  CBC:  Recent Labs Lab 03/22/16 0410  03/24/16 0358 03/25/16 0824 03/26/16 0844 03/27/16 0421 03/28/16 0338  WBC 2.7*  < > 3.6* 2.8* 2.5* 2.7* 2.9*  NEUTROABS 1.7  --   --   --  1.2*  --   --   HGB 8.1*  < > 8.6* 8.9* 7.9* 7.4* 6.5*  HCT 23.8*  < > 26.0* 26.6* 23.9* 21.5* 19.2*  MCV 98.3  < > 100.8* 100.4* 100.8* 98.6 101.1*  PLT 27*  < > 39* 36* 35* 36* 54*  < > = values in this interval not displayed. Cardiac Enzymes: No results for input(s): CKTOTAL, CKMB, CKMBINDEX, TROPONINI in the last 168 hours. BNP: Invalid input(s): POCBNP CBG: No results for input(s): GLUCAP in the last 168 hours. D-Dimer No results for input(s): DDIMER in the last 72 hours. Hgb A1c No results for input(s): HGBA1C in the last 72 hours. Lipid Profile No results for input(s): CHOL, HDL, LDLCALC, TRIG, CHOLHDL, LDLDIRECT in the last 72 hours. Thyroid function studies No results for input(s): TSH, T4TOTAL, T3FREE, THYROIDAB in the last 72  hours.  Invalid input(s): FREET3 Anemia work up No results for input(s): VITAMINB12, FOLATE, FERRITIN, TIBC, IRON, RETICCTPCT in the last 72 hours. Microbiology Recent Results (from the past 240 hour(s))  MRSA PCR Screening     Status: None   Collection Time: 03/19/16  9:33 AM  Result Value Ref Range Status   MRSA by PCR NEGATIVE NEGATIVE Final    Comment:        The GeneXpert MRSA Assay (FDA approved for NASAL specimens only), is one component of a comprehensive MRSA colonization surveillance program. It is not intended to diagnose MRSA infection nor to guide or monitor treatment for MRSA infections.   Culture, respiratory (NON-Expectorated)     Status: None   Collection Time: 03/21/16  9:43 PM  Result Value Ref Range Status   Specimen Description SPU  Final   Special Requests NONE  Final   Gram Stain   Final    ABUNDANT WBC PRESENT,BOTH PMN AND MONONUCLEAR ABUNDANT GRAM POSITIVE COCCI IN PAIRS IN CHAINS ABUNDANT GRAM VARIABLE ROD ABUNDANT YEAST    Culture   Final    Consistent with normal respiratory flora. Performed at [facility]    Report Status 03/24/2016 FINAL  Final  Culture, blood (x 2)     Status: None   Collection Time: 03/22/16 12:36 PM  Result Value Ref Range Status   Specimen Description BLOOD LEFT ARM  Final   Special Requests BOTTLES DRAWN AEROBIC AND ANAEROBIC [place]  Final   Culture   Final    NO GROWTH 5 DAYS Performed at [facility]    Report Status 03/27/2016 FINAL  Final  Culture, blood (x 2)     Status: None   Collection Time: 03/22/16 12:44 PM  Result Value Ref Range Status   Specimen Description BLOOD RIGHT ANTECUBITAL  Final   Special Requests BOTTLES DRAWN AEROBIC AND ANAEROBIC 10CC EACH  Final   Culture   Final    NO GROWTH 5 DAYS Performed at [facility]    Report Status 03/27/2016 FINAL  Final  Body fluid culture     Status: None (Preliminary result)   Collection Time: 03/27/16  3:40 PM  Result Value  Ref Range Status   Specimen Description PLEURAL  Final   Special  Requests NONE  Final   Gram Stain   Final    RARE WBC PRESENT, PREDOMINANTLY PMN NO ORGANISMS SEEN MICROSCOPIC FINDINGS SUGGEST THAT THIS SPECIMEN IS NOT REPRESENTATIVE OF LOWER RESPIRATORY SECRETIONS. PLEASE RECOLLECT.    Culture   Final    NO GROWTH < 24 HOURS Performed at [facility]    Report Status PENDING  Incomplete  Body fluid culture     Status: None (Preliminary result)   Collection Time: 03/28/16 11:48 AM  Result Value Ref Range Status   Specimen Description PLEURAL LEFT  Final   Special Requests NONE  Final   Gram Stain   Final    RARE WBC PRESENT, PREDOMINANTLY MONONUCLEAR NO ORGANISMS SEEN Performed at [facility]    Culture PENDING  Incomplete   Report Status PENDING  Incomplete    Studies:  Dg Chest 2 View  Result Date: 03/27/2016 CLINICAL DATA:  History of multiple myeloma, on chemotherapy. Pleural effusion. EXAM: CHEST  2 VIEW COMPARISON:  03/26/2016 FINDINGS: There are moderate bilateral pleural effusions. Bibasilar atelectasis or infiltrates, left worse than right. Effusions have increased slightly since prior study. Heart is borderline in size. Underlying COPD. Biapical scarring. Multiple old left rib fractures and multiple severe compression fractures throughout the thoracic spine. IMPRESSION: Moderate bilateral pleural effusions, increasing since prior study. Bibasilar atelectasis or infiltrates, left greater than right. COPD. Multiple old left rib fractures and multiple severe thoracic spine compression fractures. Electronically Signed   By: Rolm Baptise M.D.   On: 03/27/2016 10:01   Dg Chest Port 1 View  Result Date: 03/28/2016 CLINICAL DATA:  Left thoracentesis EXAM: PORTABLE CHEST 1 VIEW COMPARISON:  Chest x-ray of 03/27/2016 FINDINGS: Some of the left pleural effusion has been evacuated after left thoracentesis. No pneumothorax is seen. There may be a tiny right pleural  effusion present. Mild cardiomegaly is stable. Multiple old healed left rib fractures are noted with an old healed fracture of the left proximal clavicle. However, there does appear to be an acute fracture of the anterior right eighth rib on the frontal view. IMPRESSION: 1. Decrease in left pleural effusion after left thoracentesis. No pneumothorax. 2. Fracture of the anterior right eighth rib appears acute. Old left rib fractures are noted. 3. Cannot exclude a small right pleural effusion. Electronically Signed   By: Ivar Drape M.D.   On: 03/28/2016 12:18   Dg Chest Port 1 View  Result Date: 03/27/2016 CLINICAL DATA:  Followup right thoracentesis. EXAM: PORTABLE CHEST 1 VIEW COMPARISON:  Earlier same day FINDINGS: Much less pleural fluid on the right. No pneumothorax. Improved aeration at the right lung base. Persistent left effusion with left lower lobe volume loss. Chronic healed fractures on the left. IMPRESSION: Status post right thoracentesis. Much less pleural fluid on the right. No pneumothorax. Better aeration of the right lower lobe. Persistent left effusion and left lower lobe volume loss. Electronically Signed   By: Nelson Chimes M.D.   On: 03/27/2016 16:15    Assessment: 75 y.o. female with history of multiple myeloma status post autotransplant in August 2014, recently had disease relapse, and switched to new chemotherapy treatment with pomalidomide, dexa and daratumumab    1. HCAP with hypoxic respiratory failure, improved  2. Bilateral pulmonary effusion, likely related to fluid overload and CHF, status post bilateral thoracentesis 3. Sepsis with hypotension, resolved  4. Multiple myeloma, relapsed in July 2017, on new chemo  5. Pancytopenia due to chemotherapy, neotropenia again  6. Coronary artery disease, history  of STEMI in August 2015 7. CHF exacerbation  8. Malnutrition and deconditioning   Plan:  -her pleural effusion tests supportive transudate, no evidence of infection   -cytoplogy is still pending, doubt her pleural effusion is related to MM -Please consider 2u RBC to bring her Hb above 8  -her worsening anemia could be related to her recent chemo, no clinical suspicion of bleeding, will check ret and haptoglobin to rule out hemolysis  -I spoke with her daughter Dr. Leamon Arnt today  -will see if we can stop neupogen tomorrow if ANC>1.5   Will continue following   [name], MD 03/28/2016  8:59 PM

## 2016-03-29 LAB — CBC WITH DIFFERENTIAL/PLATELET
BASOS PCT: 0 %
Basophils Absolute: 0 10*3/uL (ref 0.0–0.1)
EOS PCT: 1 %
Eosinophils Absolute: 0 10*3/uL (ref 0.0–0.7)
HCT: 20.6 % — ABNORMAL LOW (ref 36.0–46.0)
Hemoglobin: 7.2 g/dL — ABNORMAL LOW (ref 12.0–15.0)
LYMPHS ABS: 0.9 10*3/uL (ref 0.7–4.0)
Lymphocytes Relative: 24 %
MCH: 32.9 pg (ref 26.0–34.0)
MCHC: 35 g/dL (ref 30.0–36.0)
MCV: 94.1 fL (ref 78.0–100.0)
MONO ABS: 0.1 10*3/uL (ref 0.1–1.0)
Monocytes Relative: 4 %
NEUTROS ABS: 2.6 10*3/uL (ref 1.7–7.7)
Neutrophils Relative %: 71 %
PLATELETS: 43 10*3/uL — AB (ref 150–400)
RBC: 2.19 MIL/uL — ABNORMAL LOW (ref 3.87–5.11)
RDW: 17 % — AB (ref 11.5–15.5)
WBC: 3.6 10*3/uL — ABNORMAL LOW (ref 4.0–10.5)

## 2016-03-29 LAB — CBC
HCT: 29 % — ABNORMAL LOW (ref 36.0–46.0)
HEMOGLOBIN: 9.9 g/dL — AB (ref 12.0–15.0)
MCH: 32.7 pg (ref 26.0–34.0)
MCHC: 34.1 g/dL (ref 30.0–36.0)
MCV: 95.7 fL (ref 78.0–100.0)
PLATELETS: 43 10*3/uL — AB (ref 150–400)
RBC: 3.03 MIL/uL — AB (ref 3.87–5.11)
RDW: 16.5 % — ABNORMAL HIGH (ref 11.5–15.5)
WBC: 4.4 10*3/uL (ref 4.0–10.5)

## 2016-03-29 LAB — BASIC METABOLIC PANEL
Anion gap: 2 — ABNORMAL LOW (ref 5–15)
BUN: 34 mg/dL — AB (ref 6–20)
CHLORIDE: 106 mmol/L (ref 101–111)
CO2: 33 mmol/L — AB (ref 22–32)
CREATININE: 0.48 mg/dL (ref 0.44–1.00)
Calcium: 8.2 mg/dL — ABNORMAL LOW (ref 8.9–10.3)
GFR calc Af Amer: >60 mL/min (ref >60)
GFR calc non Af Amer: >60 mL/min (ref >60)
Glucose, Bld: 94 mg/dL (ref 65–99)
Potassium: 3.6 mmol/L (ref 3.5–5.1)
Sodium: 141 mmol/L (ref 135–145)

## 2016-03-29 LAB — PREPARE RBC (CROSSMATCH)

## 2016-03-29 MED ORDER — FUROSEMIDE 20 MG PO TABS: 20.0000 mg | ORAL_TABLET | Freq: Every day | ORAL | 0 refills | Status: DC

## 2016-03-29 MED ORDER — SODIUM CHLORIDE 0.9 % IV SOLN: Freq: Once | INTRAVENOUS | Status: DC

## 2016-03-29 MED ORDER — FUROSEMIDE 20 MG PO TABS: 20.0000 mg | ORAL_TABLET | Freq: Every day | ORAL | 0 refills | Status: DC | PRN

## 2016-03-29 NOTE — Progress Notes (Signed)
Patient ambulatory around entire length of unit, maintained oxygen level in 90's.  Denied dyspnea.

## 2016-03-29 NOTE — Progress Notes (Signed)
Shannon Rush   DOB:1940-08-09   PN#:[id number]   [phone]  Hematology and Oncology follow up note  Subjective: Shannon Rush feels much better today, off oxygen, has walked in hallway, no dizziness, likely go home today after blood transfusion today   Objective:  Vitals:   03/29/16 1045 03/29/16 1236  BP: (!) 92/59   Pulse: 84 83  Resp: 20 18  Temp: 97.9 F (36.6 C) 98.1 F (36.7 C)    Body mass index is 18.79 kg/m.  Intake/Output Summary (Last 24 hours) at 03/29/16 1241 Last data filed at 03/29/16 1236  Gross per 24 hour  Intake             1419 ml  Output             3600 ml  Net            -2181 ml     Sclerae unicteric  Oropharynx clear  No peripheral adenopathy  Lungs clear -- no rales or rhonchi, decreased breath sound on bilateral lung bases.  Heart regular rate and rhythm  Abdomen benign  MSK no focal spinal tenderness, no peripheral edema  Neuro nonfocal    CBG (last 3)  No results for input(s): GLUCAP in the last 72 hours.   Labs:  Lab Results  Component Value Date   WBC 3.6 (L) 03/29/2016   HGB 7.2 (L) 03/29/2016   HCT 20.6 (L) 03/29/2016   MCV 94.1 03/29/2016   PLT 43 (L) 03/29/2016   NEUTROABS 2.6 03/29/2016    CMP Latest Ref Rng & Units 03/29/2016 03/28/2016 03/27/2016  Glucose 65 - 99 mg/dL 94 98 90  BUN 6 - 20 mg/dL 34(H) 39(H) 38(H)  Creatinine 0.44 - 1.00 mg/dL 0.48 0.52 0.58  Sodium 135 - 145 mmol/L 141 142 142  Potassium 3.5 - 5.1 mmol/L 3.6 3.8 4.0  Chloride 101 - 111 mmol/L 106 107 109  CO2 22 - 32 mmol/L 33(H) 33(H) 30  Calcium 8.9 - 10.3 mg/dL 8.2(L) 8.1(L) 8.0(L)  Total Protein 6.5 - 8.1 g/dL - 4.2(L) 4.4(L)  Total Bilirubin 0.3 - 1.2 mg/dL - - -  Alkaline Phos 38 - 126 U/L - - -  AST 15 - 41 U/L - - -  ALT 14 - 54 U/L - - -     Urine Studies No results for input(s): UHGB, CRYS in the last 72 hours.  Invalid input(s): UACOL, UAPR, USPG, UPH, UTP, UGL, UKET, UBIL, UNIT, UROB, [place], UEPI, UWBC, [place],  [place]  Basic Metabolic Panel:  Recent Labs Lab 03/25/16 0824 03/26/16 0844 03/27/16 0421 03/28/16 0338 03/29/16 0424  NA 140 141 142 142 141  K 3.2* 3.5 4.0 3.8 3.6  CL 104 108 109 107 106  CO2 _0 33* 33*  GLUCOSE 122* 110* 90 98 94  BUN 33* 34* 38* 39* 34*  CREATININE 0.54 0.47 0.58 0.52 0.48  CALCIUM 7.7* 7.9* 8.0* 8.1* 8.2*   GFR Estimated Creatinine Clearance: 52.2 mL/min (by C-G formula based on SCr of 0.48 mg/dL). Liver Function Tests:  Recent Labs Lab 03/27/16 1649 03/28/16 0338  PROT 4.4* 4.2*  ALBUMIN  --  2.3*   No results for input(s): LIPASE, AMYLASE in the last 168 hours. No results for input(s): AMMONIA in the last 168 hours. Coagulation profile No results for input(s): INR, PROTIME in the last 168 hours.  CBC:  Recent Labs Lab 03/25/16 0824 03/26/16 0844 03/27/16 0421 03/28/16 0338 03/29/16 0424  WBC 2.8*  2.5* 2.7* 2.9* 3.6*  NEUTROABS  --  1.2*  --   --  2.6  HGB 8.9* 7.9* 7.4* 6.5* 7.2*  HCT 26.6* 23.9* 21.5* 19.2* 20.6*  MCV 100.4* 100.8* 98.6 101.1* 94.1  PLT 36* 35* 36* 54* 43*   Cardiac Enzymes: No results for input(s): CKTOTAL, CKMB, CKMBINDEX, TROPONINI in the last 168 hours. BNP: Invalid input(s): POCBNP CBG: No results for input(s): GLUCAP in the last 168 hours. D-Dimer No results for input(s): DDIMER in the last 72 hours. Hgb A1c No results for input(s): HGBA1C in the last 72 hours. Lipid Profile No results for input(s): CHOL, HDL, LDLCALC, TRIG, CHOLHDL, LDLDIRECT in the last 72 hours. Thyroid function studies No results for input(s): TSH, T4TOTAL, T3FREE, THYROIDAB in the last 72 hours.  Invalid input(s): FREET3 Anemia work up No results for input(s): VITAMINB12, FOLATE, FERRITIN, TIBC, IRON, RETICCTPCT in the last 72 hours. Microbiology Recent Results (from the past 240 hour(s))  Culture, respiratory (NON-Expectorated)     Status: None   Collection Time: 03/21/16  9:43 PM  Result Value Ref Range Status    Specimen Description SPU  Final   Special Requests NONE  Final   Gram Stain   Final    ABUNDANT WBC PRESENT,BOTH PMN AND MONONUCLEAR ABUNDANT GRAM POSITIVE COCCI IN PAIRS IN CHAINS ABUNDANT GRAM VARIABLE ROD ABUNDANT YEAST    Culture   Final    Consistent with normal respiratory flora. Performed at [facility]    Report Status 03/24/2016 FINAL  Final  Culture, blood (x 2)     Status: None   Collection Time: 03/22/16 12:36 PM  Result Value Ref Range Status   Specimen Description BLOOD LEFT ARM  Final   Special Requests BOTTLES DRAWN AEROBIC AND ANAEROBIC [place]  Final   Culture   Final    NO GROWTH 5 DAYS Performed at [facility]    Report Status 03/27/2016 FINAL  Final  Culture, blood (x 2)     Status: None   Collection Time: 03/22/16 12:44 PM  Result Value Ref Range Status   Specimen Description BLOOD RIGHT ANTECUBITAL  Final   Special Requests BOTTLES DRAWN AEROBIC AND ANAEROBIC 10CC EACH  Final   Culture   Final    NO GROWTH 5 DAYS Performed at [facility]    Report Status 03/27/2016 FINAL  Final  Body fluid culture     Status: None (Preliminary result)   Collection Time: 03/27/16  3:40 PM  Result Value Ref Range Status   Specimen Description PLEURAL  Final   Special Requests NONE  Final   Gram Stain   Final    RARE WBC PRESENT, PREDOMINANTLY PMN NO ORGANISMS SEEN    Culture   Final    NO GROWTH 2 DAYS Performed at [facility]    Report Status PENDING  Incomplete  Body fluid culture     Status: None (Preliminary result)   Collection Time: 03/28/16 11:48 AM  Result Value Ref Range Status   Specimen Description PLEURAL LEFT  Final   Special Requests NONE  Final   Gram Stain   Final    RARE WBC PRESENT, PREDOMINANTLY MONONUCLEAR NO ORGANISMS SEEN    Culture   Final    NO GROWTH 1 DAY Performed at [facility]    Report Status PENDING  Incomplete    Studies:  Dg Chest Port 1 View  Result Date:  03/28/2016 CLINICAL DATA:  Left thoracentesis EXAM: PORTABLE CHEST  1 VIEW COMPARISON:  Chest x-ray of 03/27/2016 FINDINGS: Some of the left pleural effusion has been evacuated after left thoracentesis. No pneumothorax is seen. There may be a tiny right pleural effusion present. Mild cardiomegaly is stable. Multiple old healed left rib fractures are noted with an old healed fracture of the left proximal clavicle. However, there does appear to be an acute fracture of the anterior right eighth rib on the frontal view. IMPRESSION: 1. Decrease in left pleural effusion after left thoracentesis. No pneumothorax. 2. Fracture of the anterior right eighth rib appears acute. Old left rib fractures are noted. 3. Cannot exclude a small right pleural effusion. Electronically Signed   By: Ivar Drape M.D.   On: 03/28/2016 12:18   Dg Chest Port 1 View  Result Date: 03/27/2016 CLINICAL DATA:  Followup right thoracentesis. EXAM: PORTABLE CHEST 1 VIEW COMPARISON:  Earlier same day FINDINGS: Much less pleural fluid on the right. No pneumothorax. Improved aeration at the right lung base. Persistent left effusion with left lower lobe volume loss. Chronic healed fractures on the left. IMPRESSION: Status post right thoracentesis. Much less pleural fluid on the right. No pneumothorax. Better aeration of the right lower lobe. Persistent left effusion and left lower lobe volume loss. Electronically Signed   By: Nelson Chimes M.D.   On: 03/27/2016 16:15    Assessment: 75 y.o. female with history of multiple myeloma status post autotransplant in August 2014, recently had disease relapse, and switched to new chemotherapy treatment with pomalidomide, dexa and daratumumab    1. HCAP with hypoxic respiratory failure, improved  2. Bilateral pulmonary effusion, likely related to fluid overload and CHF, status post bilateral thoracentesis 3. Sepsis with hypotension, resolved  4. Multiple myeloma, relapsed in July 2017, on new chemo  5.  Pancytopenia due to chemotherapy, neotropenia again  6. Coronary artery disease, history of STEMI in August 2015 7. CHF exacerbation  8. Malnutrition and deconditioning   Plan:  -Her pleural effusion cytology was negative for plasma cells, her pleural effusions are unlikely related to her MM -OK to discharge home from my standpoint, I will see her on 9/20 with lab  -She would benefit from home PT, please refer on discharge, thanks.   [name], MD 03/29/2016  12:41 PM

## 2016-03-29 NOTE — Progress Notes (Signed)
Patient Name: Shannon Rush Date of Encounter: 03/29/2016  Principal Problem:   Acute respiratory failure [facility]) Active Problems:   Multiple myeloma ([place])   History of STEMI-Aug 2015- secondary to thrombotic state from chemo   CAD -50-60% RCA Aug 2015   Sepsis ([place])   Pancytopenia ([place])   Protein-calorie malnutrition ([place])   HCAP (healthcare-associated pneumonia)   Adrenocortical insufficiency ([place])   Patient on antineoplastic chemotherapy regimen   Cardiomyopathy-drop in EF to 45-50% etiology not yet determined   Pleural effusion   Hypoxia   Diastolic dysfunction-grade 2   Antineoplastic chemotherapy induced anemia   Shortness of breath   Acute on chronic combined systolic and diastolic CHF, NYHA class 3 ([place])   Primary Cardiologist: Dr Johnsie Cancel Patient Profile:74 yo w/ hx STEMI ([place]) 2015 w/ thrombolytics (RCA 50-60%) 2nd chemo, mult myeloma, osteoporosis, HLD. Admitted 08/27 w/ HCAP, cards saw 09/11 for CHF, pleural effusions s/p tap 600 cc  SUBJECTIVE: Feels stronger today, has been walking without oxygen and doing ok.   OBJECTIVE Vitals:   03/28/16 2114 03/29/16 0300 03/29/16 0430 03/29/16 0433  BP: 93/64  (!) 87/58   Pulse: 87  97   Resp: 18     Temp: 98 F (36.7 C)  98.2 F (36.8 C)   TempSrc: Oral  Oral   SpO2: 100% 92% 90% 93%  Weight:   116 lb 6.4 oz (52.8 kg)   Height:        Intake/Output Summary (Last 24 hours) at 03/29/16 0745 Last data filed at 03/29/16 0431  Gross per 24 hour  Intake             1084 ml  Output             3000 ml  Net            -1916 ml   Filed Weights   03/27/16 0521 03/28/16 1014 03/29/16 0430  Weight: 122 lb 11.2 oz (55.7 kg) 116 lb 6.5 oz (52.8 kg) 116 lb 6.4 oz (52.8 kg)    PHYSICAL EXAM General: Well developed, well nourished, female in no acute distress. Head: Normocephalic, atraumatic.  Neck: Supple without bruits, JVD 10 cm. Lungs:  Resp regular and unlabored, scattered rales and rhonchi. Heart: RRR, S1, S2,  no S3, S4, or murmur; no rub. Abdomen: Soft, non-tender, non-distended, BS + x 4.  Extremities: No clubbing, cyanosis, edema.  Neuro: Alert and oriented X 3. Moves all extremities spontaneously. Psych: Normal affect.  LABS: CBC: Recent Labs  03/26/16 0844  03/28/16 0338 03/29/16 0424  WBC 2.5*  < > 2.9* 3.6*  NEUTROABS 1.2*  --   --  2.6  HGB 7.9*  < > 6.5* 7.2*  HCT 23.9*  < > 19.2* 20.6*  MCV 100.8*  < > 101.1* 94.1  PLT 35*  < > 54* 43*  < > = values in this interval not displayed.  Basic Metabolic Panel: Recent Labs  03/28/16 0338 03/29/16 0424  NA 142 141  K 3.8 3.6  CL 107 106  CO2 33* 33*  GLUCOSE 98 94  BUN 39* 34*  CREATININE 0.52 0.48  CALCIUM 8.1* 8.2*   Liver Function Tests: Recent Labs  03/27/16 1649 03/28/16 0338  PROT 4.4* 4.2*  ALBUMIN  --  2.3*   BNP:  B Natriuretic Peptide  Date/Time Value Ref Range Status  03/25/2016 01:14 PM 188.9 (H) 0.0 - 100.0 pg/mL Final    Radiology/Studies: Dg Chest Port 1 View Result  Date: 03/28/2016 CLINICAL DATA:  Left thoracentesis EXAM: PORTABLE CHEST 1 VIEW COMPARISON:  Chest x-ray of 03/27/2016 FINDINGS: Some of the left pleural effusion has been evacuated after left thoracentesis. No pneumothorax is seen. There may be a tiny right pleural effusion present. Mild cardiomegaly is stable. Multiple old healed left rib fractures are noted with an old healed fracture of the left proximal clavicle. However, there does appear to be an acute fracture of the anterior right eighth rib on the frontal view. IMPRESSION: 1. Decrease in left pleural effusion after left thoracentesis. No pneumothorax. 2. Fracture of the anterior right eighth rib appears acute. Old left rib fractures are noted. 3. Cannot exclude a small right pleural effusion. Electronically Signed   By: Ivar Drape M.D.   On: 03/28/2016 12:18   Dg Chest Port 1 View Result Date: 03/27/2016 CLINICAL DATA:  Followup right thoracentesis. EXAM: PORTABLE CHEST 1 VIEW  COMPARISON:  Earlier same day FINDINGS: Much less pleural fluid on the right. No pneumothorax. Improved aeration at the right lung base. Persistent left effusion with left lower lobe volume loss. Chronic healed fractures on the left. IMPRESSION: Status post right thoracentesis. Much less pleural fluid on the right. No pneumothorax. Better aeration of the right lower lobe. Persistent left effusion and left lower lobe volume loss. Electronically Signed   By: Cotton Beckley Chimes M.D.   On: 03/27/2016 16:15     Current Medications:  . sodium chloride   Intravenous Once  . atorvastatin  40 mg Oral Daily  . budesonide (PULMICORT) nebulizer solution  0.5 mg Nebulization BID  . calcium carbonate  1,250 mg Oral Daily  . feeding supplement (PRO-STAT SUGAR FREE 64)  30 mL Oral BID  . filgrastim  480 mcg Subcutaneous QPC supper  . fluticasone  2 spray Each Nare Daily  . furosemide  20 mg Intravenous BID  . furosemide  40 mg Intravenous Once  . guaiFENesin  1,200 mg Oral BID  . metoprolol succinate  25 mg Oral Daily  . potassium chloride  20 mEq Oral Daily      ASSESSMENT AND PLAN: 75 year old female with known multiple myeloma since 2013 treated with Revlimid, recently switched to (pomalidomide, dexamethasone, daratumumab) with pancytopenia, admitted with acute hypoxic respiratory failure sec to pneumonia. She also has a h/o STEMIin August 2015 in [place] rxwith athrombolytic. Cath w/50-60% RCA. [place] Felt thrombotic secondary to chemotherapy (Revlimid). She was on ASA, Plavix, statin and beta blocker.   Principal Problem:    Acute respiratory failure (HCC) - EF now 45-50% possibly secondary to daratumumab w/ grade 2 dd - CXR w/ pleural effusions, s/p 600 cc out 09/13 on R and 600 cc 09/14 on L  - transudative - management per CCM, resp much improved - anemia likely contributing to her SOB, weakness, s/p 1 u PRBCs, consider 2nd unit - hold am Lasix, but will need if gets another unit of blood - since  thoracentesis, MD advise on changing Lasix to po - no additional cardiology workup  Otherwise, per IM/Oncology/CCM Active Problems:   Multiple myeloma ([place])   History of STEMI-Aug 2015- secondary to thrombotic state from chemo   CAD -50-60% RCA Aug 2015   Sepsis ([place])   Pancytopenia ([place])   Protein-calorie malnutrition ([place])   HCAP (healthcare-associated pneumonia)   Adrenocortical insufficiency ([place])   Patient on antineoplastic chemotherapy regimen   Cardiomyopathy-drop in EF to 45-50% etiology not yet determined   Pleural effusion   Hypoxia   Diastolic dysfunction-grade 2  Antineoplastic chemotherapy induced anemia   Shortness of breath   Acute on chronic combined systolic and diastolic CHF, NYHA class 3 (HCC)   Signed, Barrett, Rhonda , PA-C 7:45 AM 03/29/2016    The patient was seen, examined and discussed with Rosaria Ferries, PA-C and I agree with the above.   The patient was seen, examined and discussed with Rosaria Ferries, PA-C and I agree with the above.   75 year old female with known multiple myeloma since 2013 treated with Revlimid, recently switched to (pomalidomide, dexamethasone, daratumumab) with pancytopenia, admitted with acute hypoxic respiratory failure sec to pneumonia. She also has a h/o STEMIin August 2015 in [place] and was treated with athrombolytic. Cath showed a 50-60% RCA. It was felt this was a thrombotic event secondary to chemotherapy (Revlimid). She was on ASA, Plavix, statin and beta blocker.   During this hospital visit she was volume resuscitated for septic shock, then diuresed, now still hypoxic, pulmonary asking cardiology input for possible CHF. Her LVEF is mildly decreased from previous 55-60% in 2015, now 45-50%, previously on daratumumab, not shown to cause LVEF decrease in clinical trials. On physical exam she has decreased breathing sounds in the lung bases and minimal B/L pitting edema.  I would continue betablockers and follow her  echocardiogram every 3 months if the same chemotherapy is going to be continued. However, this appears to be a prolonged course of pneumonia in a immunosuppressed patient, her BNP is 180. In addition she has a slow recovery from pancytopenia post cheo with Hb drop today, this is possibly contributing to her fatigue and desaturation. S/P thoracentesis yesterday on the right and today on the left with removal of 600 cc of yellow fluid x2. Fluid analysis and cytology pending.  She got 1 unit of red blood cells transfusion yesterday with significant symptoms improvement, I would support another transfusion since Hb still 7.2. She can be discharged from cardiac standpoint, no diuretics needed, only lasix 20 mg po daily PRN for LE edema and worsening SOB.  We will arrange for a follow up with OP clinic (Dr Johnsie Cancel).   Ena Dawley 03/29/2016

## 2016-03-29 NOTE — Progress Notes (Signed)
Discharge instructions reviewed with patient and spouse, questions answered, verbalized understanding.  Patient given script for Lasix.  Reviewed with patient the importance of taking her weight daily in the morning after urination and prior to dressing.  Patient verbalized understanding of this.

## 2016-03-29 NOTE — Discharge Summary (Signed)
Physician Discharge Summary  Shannon Rush GHW:[id number] DOB: 1941/02/28 DOA: 03/10/2016  PCP: [name], MD  Admit date: 03/10/2016 Discharge date: 03/29/2016  Admitted From: home Disposition:  Home  Recommendations for Outpatient Follow-up:  1. Follow up with Oncologist next  weeks 2. Please obtain BMP/CBC in one week 3.   Home Health:no Equipment/Devices:none  Discharge Condition:stable CODE STATUS:full Diet recommendation: Heart Healthy Fluid restricted 2L  Brief/Interim Summary: 75 year old with past medical history of multiple myeloma on chemotherapy Presents with sepsis secondary to community-acquired pneumonia and neutropenia.  Discharge Diagnoses:  Sepsis likely due to healthcare associated pneumonia: Started empirically on admission on vancomycin and cefepime stopped on 03/14/2016 resume back on 03/16/2016 and she became febrile with an increase in her leukocytosis, cultures were sent which have remained negative to date. She was transferred back to step down due to hypotension which resolved with IV fluid hydration and there were no needs for pressors. She was also treated with IV steroids and after her blood pressure improved she was tapered off steroids. Her antibiotics were Deescalated to Ceftin and doxycycline which she completed 2 week course in the hospital. CT scan of the chest was done that showed resolution of her pneumonia.  Acute respiratory failure with hypoxia due to healthcare associated pneumonia/pleural effusion: Respiratory cultures were done which showed normal flora. CT image of the chest was negative for PE but did show a large pleural effusion. 2-D echo was obtained that showed an new decrease EF of 45% with a grade 2 diastolic heart failure. Pulmonary was consulted and recommended bilateral thoracocentesis which improved her shortness of breath and she has remains without oxygen since then. Her pleural fluid was sent for analysis which  will be followed by pulmonary as an outpatient.  New acute combined systolic and diastolic heart failure/  Cardiomyopathy-drop in EF to 45-50% etiology not yet determined: Cardiology was consulted due to the results of the 2-D echo, she was continued on her metoprolol. She was aggressively diureses and her respiration improve. Cardiology recommended to take Lasix 20 mg as needed for any increase in her weight of 5 pounds or swelling of the lower extremities.  Chemotherapy-induced Pancytopenia/antineoplastic-induced anemia: Likely due to chemotherapy. She was given Neupogen and her leukocyte improved. Transfuse 2 units of packed red blood cells, the likely cause of this is chemotherapy.  Multiple myeloma [facility]) Follow-up with oncologist as an outpatient continue current regimen.  Coronary artery Disease/  History of STEMI-Aug 2015- secondary to thrombotic state from chemo: Continue Toprol, hold Plavix given thrombocytopenia and thoracocentesis.  Protein caloric malnutrition: Continue ensure.  Hypokalemia/hypophosphatemia: Continue oral repletion.  Relative Adrenocortical insufficiency (HCC) Resolved now off steroids.   Discharge Instructions  Discharge Instructions    Diet - low sodium heart healthy    Complete by:  As directed    Increase activity slowly    Complete by:  As directed        Medication List    STOP taking these medications   levofloxacin 750 MG tablet Commonly known as:  LEVAQUIN     TAKE these medications   acetaminophen 500 MG tablet Commonly known as:  TYLENOL Take 500 mg by mouth every 6 (six) hours as needed for moderate pain (pain). Reported on 07/20/2015   acyclovir 400 MG tablet Commonly known as:  ZOVIRAX Take 1 tablet (400 mg total) by mouth 2 (two) times daily.   atorvastatin 40 MG tablet Commonly known as:  LIPITOR TAKE 1 TABLET (40 MG TOTAL) BY MOUTH DAILY.  calcium carbonate 600 MG Tabs tablet Commonly known as:  OS-CAL Take  600 mg by mouth daily.   clopidogrel 75 MG tablet Commonly known as:  PLAVIX Take 1 tablet (75 mg total) by mouth daily.   dexamethasone 4 MG tablet Commonly known as:  DECADRON Take 1 tablet (4 mg total) by mouth once a week. What changed:  how much to take  when to take this  additional instructions   fluticasone 50 MCG/ACT nasal spray Commonly known as:  FLONASE Place 2 sprays into both nostrils daily.   furosemide 20 MG tablet Commonly known as:  LASIX Take 1 tablet (20 mg total) by mouth daily as needed. Take if leg swelling or and increase of 5 lb in weight   metoprolol succinate 25 MG 24 hr tablet Commonly known as:  TOPROL-XL Take 1 tablet (25 mg total) by mouth daily.   morphine 15 MG tablet Commonly known as:  MSIR Take 1 tablet (15 mg total) by mouth every 6 (six) hours as needed for severe pain.   nitroGLYCERIN 0.4 MG SL tablet Commonly known as:  NITROSTAT Place under the tongue. Reported on 07/20/2015   ondansetron 8 MG tablet Commonly known as:  ZOFRAN Take 1 tablet (8 mg total) by mouth 2 (two) times daily as needed (Nausea or vomiting).   pomalidomide 4 MG capsule Commonly known as:  POMALYST Take 1 capsule (4 mg total) by mouth daily. Take with water on days 1-21. Repeat every 28 days.   PROAIR HFA 108 (90 Base) MCG/ACT inhaler Generic drug:  albuterol Inhale 2 puffs into the lungs every 6 (six) hours as needed.   prochlorperazine 10 MG tablet Commonly known as:  COMPAZINE Take 1 tablet (10 mg total) by mouth every 6 (six) hours as needed (Nausea or vomiting).       Allergies  Allergen Reactions  . Zosyn [Piperacillin Sod-Tazobactam So] Rash    Itching, rash and swelling of extremities  . Piperacillin-Tazobactam In Dex Rash  . Zithromax [Azithromycin]     Full body rash  . Ciprofloxacin Rash    Throwing up and stomach pain     Consultations:  Cardiology  Oncology  Pulmonary and critical care   Procedures/Studies: Dg Chest 2  View  Result Date: 03/27/2016 CLINICAL DATA:  History of multiple myeloma, on chemotherapy. Pleural effusion. EXAM: CHEST  2 VIEW COMPARISON:  03/26/2016 FINDINGS: There are moderate bilateral pleural effusions. Bibasilar atelectasis or infiltrates, left worse than right. Effusions have increased slightly since prior study. Heart is borderline in size. Underlying COPD. Biapical scarring. Multiple old left rib fractures and multiple severe compression fractures throughout the thoracic spine. IMPRESSION: Moderate bilateral pleural effusions, increasing since prior study. Bibasilar atelectasis or infiltrates, left greater than right. COPD. Multiple old left rib fractures and multiple severe thoracic spine compression fractures. Electronically Signed   By: Rolm Baptise M.D.   On: 03/27/2016 10:01   Dg Chest 2 View  Result Date: 03/23/2016 CLINICAL DATA:  Shortness of breath, cough. EXAM: CHEST  2 VIEW COMPARISON:  Radiograph of March 19, 2016. FINDINGS: Stable cardiomediastinal silhouette. No pneumothorax is noted. Old left rib fractures are noted. Bibasilar opacities are noted concerning for edema or atelectasis with mild associated pleural effusions. Severe compression deformity is seen involving multiple thoracic vertebral bodies most consistent with old fractures. IMPRESSION: Bibasilar edema or atelectasis is noted with mild associated pleural effusions. Electronically Signed   By: Marijo Conception, M.D.   On: 03/23/2016 13:18   Dg  Chest 2 View  Result Date: 03/17/2016 CLINICAL DATA:  Sepsis and cough. Neutropenia and history of multiple myeloma. EXAM: CHEST  2 VIEW COMPARISON:  03/16/2016 FINDINGS: Bilateral lower lung infiltrates again noted, left greater than right. Associated small bilateral pleural effusions appears stable. Stable chronic lung disease. The heart size is normal. Stable compression deformities of the thoracic spine and left scapular lesion. Poorly healed left-sided rib fractures also  again noted. IMPRESSION: Bilateral lower lobe pulmonary infiltrates, left greater than right, appear relatively stable. Associated small bilateral pleural effusions. Electronically Signed   By: Aletta Edouard M.D.   On: 03/17/2016 11:30   Dg Chest 2 View  Result Date: 03/16/2016 CLINICAL DATA:  Fever.  History of multiple myeloma. EXAM: CHEST  2 VIEW COMPARISON:  03/03/2016. FINDINGS: Underlying chronic lung disease with emphysema and pulmonary scarring. Superimposed bilateral infiltrates and small effusions. Numerous bone lesions are again demonstrated consistent with known multiple myeloma. IMPRESSION: Chronic underlying lung disease with superimposed infiltrates and effusions. Multiple bone lesions consistent with known multiple myeloma. Electronically Signed   By: Marijo Sanes M.D.   On: 03/16/2016 08:49   Dg Chest 2 View  Result Date: 03/10/2016 CLINICAL DATA:  Cough and shortness of breath.  Chest pain EXAM: CHEST  2 VIEW COMPARISON:  11/02/2014 FINDINGS: Normal heart size. Advanced chronic interstitial lung changes are again identified. Superimposed bibasilar opacities are identified. There are small pleural effusions present. Chronic multi level compression deformities are identified within the thoracic spine. IMPRESSION: 1. Bibasilar opacities which may represent atelectasis and/or pneumonia. 2. Small bilateral pleural effusions. Electronically Signed   By: Kerby Moors M.D.   On: 03/10/2016 10:25   Ct Angio Chest Pe W Or Wo Contrast  Result Date: 03/24/2016 CLINICAL DATA:  Hypoxia, history of multiple myeloma. EXAM: CT ANGIOGRAPHY CHEST WITH CONTRAST TECHNIQUE: Multidetector CT imaging of the chest was performed using the standard protocol during bolus administration of intravenous contrast. Multiplanar CT image reconstructions and MIPs were obtained to evaluate the vascular anatomy. CONTRAST:  100 mL of Isovue 370 intravenously. COMPARISON:  CT scan of July 13, 2012. FINDINGS: No  pneumothorax is noted. Moderate bilateral pleural effusions are noted. Subsegmental atelectasis of right lower lobe is noted. Large atelectasis of left lower lobe is noted. There is no evidence of pulmonary embolus. There is no evidence of thoracic aortic dissection or aneurysm. Coronary artery calcifications are noted. No mediastinal mass or adenopathy is noted. Sclerotic densities are noted throughout the ribs consistent with old myeloma lesions. Focal lytic area is seen involving right scapula concerning for multiple myeloma. Multiple severe old compression fractures are seen involving the thoracic spine consistent with multiple myeloma. Old sternal fracture is noted. Review of the MIP images confirms the above findings. IMPRESSION: No evidence of pulmonary embolus. Moderate bilateral pleural effusions are noted with atelectasis of the adjacent lower lobes. Coronary artery calcifications are noted. Sclerotic and lytic bone lesions are seen consistent with history of multiple myeloma. Electronically Signed   By: Marijo Conception, M.D.   On: 03/24/2016 11:56   Dg Chest Port 1 View  Result Date: 03/28/2016 CLINICAL DATA:  Left thoracentesis EXAM: PORTABLE CHEST 1 VIEW COMPARISON:  Chest x-ray of 03/27/2016 FINDINGS: Some of the left pleural effusion has been evacuated after left thoracentesis. No pneumothorax is seen. There may be a tiny right pleural effusion present. Mild cardiomegaly is stable. Multiple old healed left rib fractures are noted with an old healed fracture of the left proximal clavicle. However, there does  appear to be an acute fracture of the anterior right eighth rib on the frontal view. IMPRESSION: 1. Decrease in left pleural effusion after left thoracentesis. No pneumothorax. 2. Fracture of the anterior right eighth rib appears acute. Old left rib fractures are noted. 3. Cannot exclude a small right pleural effusion. Electronically Signed   By: Ivar Drape M.D.   On: 03/28/2016 12:18   Dg  Chest Port 1 View  Result Date: 03/27/2016 CLINICAL DATA:  Followup right thoracentesis. EXAM: PORTABLE CHEST 1 VIEW COMPARISON:  Earlier same day FINDINGS: Much less pleural fluid on the right. No pneumothorax. Improved aeration at the right lung base. Persistent left effusion with left lower lobe volume loss. Chronic healed fractures on the left. IMPRESSION: Status post right thoracentesis. Much less pleural fluid on the right. No pneumothorax. Better aeration of the right lower lobe. Persistent left effusion and left lower lobe volume loss. Electronically Signed   By: Nelson Chimes M.D.   On: 03/27/2016 16:15   Dg Chest Port 1 View  Result Date: 03/26/2016 CLINICAL DATA:  75 year old female with respiratory failure. Effusions. Subsequent encounter. EXAM: PORTABLE CHEST 1 VIEW COMPARISON:  03/24/2016 CT.  03/23/2016 chest x-ray. FINDINGS: Shifting of moderate-size bilateral pleural effusions. Basilar atelectasis more notable on left. Pulmonary vascular congestion. Remote left rib fractures and left clavicle fracture. Diffuse osseous changes of myeloma. Calcified aorta. IMPRESSION: Shifting of moderate-size bilateral pleural effusions. Persistent bibasilar atelectasis. Pulmonary vascular congestion. Electronically Signed   By: Genia Del M.D.   On: 03/26/2016 07:23   Dg Chest Port 1 View  Result Date: 03/19/2016 CLINICAL DATA:  Shortness of breath/respiratory failure EXAM: PORTABLE CHEST 1 VIEW COMPARISON:  March 18, 2016 FINDINGS: Interstitial edema is stable. There are pleural effusions bilaterally with apparent loculated effusion at the right base. There is a degree of bibasilar atelectasis. A degree of superimposed pneumonia in the left base is questioned. Heart is mildly enlarged with pulmonary venous hypertension. No adenopathy is evident. Bones are osteoporotic. There are old fractures involving multiple ribs on the left. The inferior left scapula, and mid left clavicle. IMPRESSION: Congestive  heart failure. Suspect superimposed pneumonia left lower lobe. Multiple prior fractures. Bones osteoporotic. Electronically Signed   By: Lowella Grip III M.D.   On: 03/19/2016 07:07   Dg Chest Port 1 View  Result Date: 03/18/2016 CLINICAL DATA:  Acute respiratory failure, multiple myeloma EXAM: PORTABLE CHEST 1 VIEW COMPARISON:  Multiple priors, most recently 03/17/2016 FINDINGS: Upper lobe predominant scarring, chronic. Possible mild perihilar edema. Small bilateral pleural effusions, mildly increased. Associated patchy right lower lobe opacity, atelectasis versus pneumonia. Suspected left lower lobe opacity, although obscured by overlying soft tissues. Cardiomegaly. Degenerative changes of the thoracic spine. IMPRESSION: Cardiomegaly with possible mild perihilar edema and increasing small bilateral pleural effusions. Associated patchy right lower lobe opacity, atelectasis versus pneumonia. Electronically Signed   By: Julian Hy M.D.   On: 03/18/2016 07:32   Dg Chest Port 1 View  Result Date: 03/11/2016 CLINICAL DATA:  Productive cough and chest congestion for the past 2 weeks. Fever and hypoxia. Shortness of breath. Multiple myeloma. EXAM: PORTABLE CHEST 1 VIEW COMPARISON:  Yesterday. FINDINGS: Increased patchy density at both lung bases. Small bilateral pleural effusions. Diffuse osteopenia. Old, healed left clavicle fracture and old, healed left rib fractures. Previously described chronic thoracic vertebral compression deformities. IMPRESSION: Increased probable pneumonia at both lung bases with increased bilateral pleural fluid. Electronically Signed   By: Claudie Revering M.D.   On: 03/11/2016 18:18  2-D echo showed an EF of 45% with grade 2 diastolic heart failure.   Subjective: She relates she feels much better with more energy and less weak  Discharge Exam: Vitals:   03/29/16 1016 03/29/16 1045  BP: 112/72 (!) 92/59  Pulse: (!) 105 84  Resp: 20 20  Temp: 97.9 F (36.6 C) 97.9  F (36.6 C)   Vitals:   03/29/16 0911 03/29/16 0954 03/29/16 1016 03/29/16 1045  BP:  (!) 84/50 112/72 (!) 92/59  Pulse:   (!) 105 84  Resp:   20 20  Temp:   97.9 F (36.6 C) 97.9 F (36.6 C)  TempSrc:   Oral Oral  SpO2: 91%  100% 99%  Weight:      Height:        General: Pt is alert, awake, not in acute distress Cardiovascular: RRR, S1/S2 +, no rubs, no gallops Respiratory: CTA bilaterally, no wheezing, no rhonchi Abdominal: Soft, NT, ND, bowel sounds + Extremities: no edema, no cyanosis    The results of significant diagnostics from this hospitalization (including imaging, microbiology, ancillary and laboratory) are listed below for reference.     Microbiology: Recent Results (from the past 240 hour(s))  Culture, respiratory (NON-Expectorated)     Status: None   Collection Time: 03/21/16  9:43 PM  Result Value Ref Range Status   Specimen Description SPU  Final   Special Requests NONE  Final   Gram Stain   Final    ABUNDANT WBC PRESENT,BOTH PMN AND MONONUCLEAR ABUNDANT GRAM POSITIVE COCCI IN PAIRS IN CHAINS ABUNDANT GRAM VARIABLE ROD ABUNDANT YEAST    Culture   Final    Consistent with normal respiratory flora. Performed at [facility]    Report Status 03/24/2016 FINAL  Final  Culture, blood (x 2)     Status: None   Collection Time: 03/22/16 12:36 PM  Result Value Ref Range Status   Specimen Description BLOOD LEFT ARM  Final   Special Requests BOTTLES DRAWN AEROBIC AND ANAEROBIC [place]  Final   Culture   Final    NO GROWTH 5 DAYS Performed at [facility]    Report Status 03/27/2016 FINAL  Final  Culture, blood (x 2)     Status: None   Collection Time: 03/22/16 12:44 PM  Result Value Ref Range Status   Specimen Description BLOOD RIGHT ANTECUBITAL  Final   Special Requests BOTTLES DRAWN AEROBIC AND ANAEROBIC 10CC EACH  Final   Culture   Final    NO GROWTH 5 DAYS Performed at [facility]    Report Status 03/27/2016 FINAL  Final   Body fluid culture     Status: None (Preliminary result)   Collection Time: 03/27/16  3:40 PM  Result Value Ref Range Status   Specimen Description PLEURAL  Final   Special Requests NONE  Final   Gram Stain   Final    RARE WBC PRESENT, PREDOMINANTLY PMN NO ORGANISMS SEEN    Culture   Final    NO GROWTH 2 DAYS Performed at [facility]    Report Status PENDING  Incomplete  Body fluid culture     Status: None (Preliminary result)   Collection Time: 03/28/16 11:48 AM  Result Value Ref Range Status   Specimen Description PLEURAL LEFT  Final   Special Requests NONE  Final   Gram Stain   Final    RARE WBC PRESENT, PREDOMINANTLY MONONUCLEAR NO ORGANISMS SEEN    Culture   Final  NO GROWTH 1 DAY Performed at [facility]    Report Status PENDING  Incomplete     Labs: BNP (last 3 results)  Recent Labs  03/25/16 1314  BNP 092.3*   Basic Metabolic Panel:  Recent Labs Lab 03/25/16 0824 03/26/16 0844 03/27/16 0421 03/28/16 0338 03/29/16 0424  NA 140 141 142 142 141  K 3.2* 3.5 4.0 3.8 3.6  CL 104 108 109 107 106  CO2 _0 33* 33*  GLUCOSE 122* 110* 90 98 94  BUN 33* 34* 38* 39* 34*  CREATININE 0.54 0.47 0.58 0.52 0.48  CALCIUM 7.7* 7.9* 8.0* 8.1* 8.2*   Liver Function Tests:  Recent Labs Lab 03/27/16 1649 03/28/16 0338  PROT 4.4* 4.2*  ALBUMIN  --  2.3*   No results for input(s): LIPASE, AMYLASE in the last 168 hours. No results for input(s): AMMONIA in the last 168 hours. CBC:  Recent Labs Lab 03/25/16 0824 03/26/16 0844 03/27/16 0421 03/28/16 0338 03/29/16 0424  WBC 2.8* 2.5* 2.7* 2.9* 3.6*  NEUTROABS  --  1.2*  --   --  2.6  HGB 8.9* 7.9* 7.4* 6.5* 7.2*  HCT 26.6* 23.9* 21.5* 19.2* 20.6*  MCV 100.4* 100.8* 98.6 101.1* 94.1  PLT 36* 35* 36* 54* 43*   Cardiac Enzymes: No results for input(s): CKTOTAL, CKMB, CKMBINDEX, TROPONINI in the last 168 hours. BNP: Invalid input(s): POCBNP CBG: No results for input(s): GLUCAP in  the last 168 hours. D-Dimer No results for input(s): DDIMER in the last 72 hours. Hgb A1c No results for input(s): HGBA1C in the last 72 hours. Lipid Profile No results for input(s): CHOL, HDL, LDLCALC, TRIG, CHOLHDL, LDLDIRECT in the last 72 hours. Thyroid function studies No results for input(s): TSH, T4TOTAL, T3FREE, THYROIDAB in the last 72 hours.  Invalid input(s): FREET3 Anemia work up No results for input(s): VITAMINB12, FOLATE, FERRITIN, TIBC, IRON, RETICCTPCT in the last 72 hours. Urinalysis    Component Value Date/Time   COLORURINE AMBER (A) 03/16/2016 1645   APPEARANCEUR CLOUDY (A) 03/16/2016 1645   LABSPEC 1.029 03/16/2016 1645   LABSPEC 1.015 08/20/2012 1124   PHURINE 6.0 03/16/2016 1645   GLUCOSEU 100 (A) 03/16/2016 1645   GLUCOSEU Negative 08/20/2012 1124   HGBUR MODERATE (A) 03/16/2016 1645   HGBUR negative 10/22/2007 1336   BILIRUBINUR NEGATIVE 03/16/2016 1645   BILIRUBINUR Negative 08/20/2012 1124   KETONESUR NEGATIVE 03/16/2016 1645   PROTEINUR 100 (A) 03/16/2016 1645   UROBILINOGEN 0.2 09/14/2014 2100   UROBILINOGEN 0.2 08/20/2012 1124   NITRITE NEGATIVE 03/16/2016 1645   LEUKOCYTESUR NEGATIVE 03/16/2016 1645   LEUKOCYTESUR Small 08/20/2012 1124   Sepsis Labs Invalid input(s): PROCALCITONIN,  WBC,  LACTICIDVEN Microbiology Recent Results (from the past 240 hour(s))  Culture, respiratory (NON-Expectorated)     Status: None   Collection Time: 03/21/16  9:43 PM  Result Value Ref Range Status   Specimen Description SPU  Final   Special Requests NONE  Final   Gram Stain   Final    ABUNDANT WBC PRESENT,BOTH PMN AND MONONUCLEAR ABUNDANT GRAM POSITIVE COCCI IN PAIRS IN CHAINS ABUNDANT GRAM VARIABLE ROD ABUNDANT YEAST    Culture   Final    Consistent with normal respiratory flora. Performed at [facility]    Report Status 03/24/2016 FINAL  Final  Culture, blood (x 2)     Status: None   Collection Time: 03/22/16 12:36 PM  Result Value Ref  Range Status   Specimen Description BLOOD LEFT ARM  Final  Special Requests BOTTLES DRAWN AEROBIC AND ANAEROBIC [place]  Final   Culture   Final    NO GROWTH 5 DAYS Performed at [facility]    Report Status 03/27/2016 FINAL  Final  Culture, blood (x 2)     Status: None   Collection Time: 03/22/16 12:44 PM  Result Value Ref Range Status   Specimen Description BLOOD RIGHT ANTECUBITAL  Final   Special Requests BOTTLES DRAWN AEROBIC AND ANAEROBIC 10CC EACH  Final   Culture   Final    NO GROWTH 5 DAYS Performed at [facility]    Report Status 03/27/2016 FINAL  Final  Body fluid culture     Status: None (Preliminary result)   Collection Time: 03/27/16  3:40 PM  Result Value Ref Range Status   Specimen Description PLEURAL  Final   Special Requests NONE  Final   Gram Stain   Final    RARE WBC PRESENT, PREDOMINANTLY PMN NO ORGANISMS SEEN    Culture   Final    NO GROWTH 2 DAYS Performed at [facility]    Report Status PENDING  Incomplete  Body fluid culture     Status: None (Preliminary result)   Collection Time: 03/28/16 11:48 AM  Result Value Ref Range Status   Specimen Description PLEURAL LEFT  Final   Special Requests NONE  Final   Gram Stain   Final    RARE WBC PRESENT, PREDOMINANTLY MONONUCLEAR NO ORGANISMS SEEN    Culture   Final    NO GROWTH 1 DAY Performed at [facility]    Report Status PENDING  Incomplete     Time coordinating discharge: Over 30 minutes  SIGNED:   Charlynne Cousins, MD  [facility] 03/29/2016, 12:17 PM Pager   If 7PM-7AM, please contact night-coverage [email or web address] Password [email or web address]

## 2016-03-29 NOTE — Progress Notes (Addendum)
PULMONARY / CRITICAL CARE MEDICINE   Name: Shannon Rush MRN: [id number] DOB: 1940-10-24    ADMISSION DATE:  03/10/2016 CONSULTATION DATE:  03/17/2016  REFERRING MD:  Dr. Chucky May  CHIEF COMPLAINT:  Fever  SUBJECTIVE:   Net negative balance, BP stable 92/59.  Feels better after blood transfusion.  Hopeful to go home late this evening.      VITAL SIGNS: BP (!) 92/59   Pulse 84   Temp 97.9 F (36.6 C) (Oral)   Resp 20   Ht 5' 6"  (1.676 m)   Wt 116 lb 6.4 oz (52.8 kg)   SpO2 99%   BMI 18.79 kg/m   INTAKE / OUTPUT: I/O last 3 completed shifts: In: 1679 [P.O.:1350; Blood:329] Out: 34 [Urine:3700]  PHYSICAL EXAMINATION: General:  Elderly female in NAD, sitting in bed Neuro:  Awake, alert, no distress HEENT:  No thyromegaly, JVD, MM pink/moist, good dentition Cardiovascular:  RRR, no MRG Lungs:  Clear, No wheeze or crackles. Clear bilaterally  Abdomen: soft, no masses Musculoskeletal:  Normal bulk, no deformities Skin:  No rashes or lesions, 1+ pitting pre-tibial edema BLE   LABS:  BMET  Recent Labs Lab 03/27/16 0421 03/28/16 0338 03/29/16 0424  NA 142 142 141  K 4.0 3.8 3.6  CL 109 107 106  CO2 30 33* 33*  BUN 38* 39* 34*  CREATININE 0.58 0.52 0.48  GLUCOSE 90 98 94    Electrolytes  Recent Labs Lab 03/27/16 0421 03/28/16 0338 03/29/16 0424  CALCIUM 8.0* 8.1* 8.2*    CBC  Recent Labs Lab 03/27/16 0421 03/28/16 0338 03/29/16 0424  WBC 2.7* 2.9* 3.6*  HGB 7.4* 6.5* 7.2*  HCT 21.5* 19.2* 20.6*  PLT 36* 54* 43*    Coag's No results for input(s): APTT, INR in the last 168 hours.  Sepsis Markers No results for input(s): LATICACIDVEN, PROCALCITON, O2SATVEN in the last 168 hours.  ABG No results for input(s): PHART, PCO2ART, PO2ART in the last 168 hours.  Liver Enzymes  Recent Labs Lab 03/28/16 0338  ALBUMIN 2.3*    Cardiac Enzymes No results for input(s): TROPONINI, PROBNP in the last 168 hours.  Glucose No results for  input(s): GLUCAP in the last 168 hours.  Imaging Dg Chest Port 1 View  Result Date: 03/28/2016 CLINICAL DATA:  Left thoracentesis EXAM: PORTABLE CHEST 1 VIEW COMPARISON:  Chest x-ray of 03/27/2016 FINDINGS: Some of the left pleural effusion has been evacuated after left thoracentesis. No pneumothorax is seen. There may be a tiny right pleural effusion present. Mild cardiomegaly is stable. Multiple old healed left rib fractures are noted with an old healed fracture of the left proximal clavicle. However, there does appear to be an acute fracture of the anterior right eighth rib on the frontal view. IMPRESSION: 1. Decrease in left pleural effusion after left thoracentesis. No pneumothorax. 2. Fracture of the anterior right eighth rib appears acute. Old left rib fractures are noted. 3. Cannot exclude a small right pleural effusion. Electronically Signed   By: Ivar Drape M.D.   On: 03/28/2016 12:18     STUDIES:  CT chest 03/24/16 > B/L pleural effusions, associated atelectasis. No PE Echo 03/24/16 > Grade 2 diastolic dysfunction. LVEF 45-50%  Rt effusion 9/11    Lt effusion 9/11     CULTURES: 8/28 Sputum >> negative 8/28 Urine >> negative 8/28 Blood >> negative 9/02 Blood >> negative  ANTIBIOTICS: 8/27 Vancomycin >> 9/8 8/27 Cefepime >> 9/8 8/27 Acyclovir >> 9/8  SIGNIFICANT EVENTS: 8/27  Admit, oncology consulted 9/03 to ICU with low blood pressure 9/11 Felt dizzy today. Orthostasis vitals checked but BP did not change on standing. Still dyspneic and hypoxic.  PCCM re consulted to evaluate pleural effusions. 9/13  Right Thoracentesis  9/14  Left Thoracentesis, 1 unit PRBC  LINES/TUBES:  DISCUSSION: 75 yo female with a PMH of CAD, HLD, osteoporosis, admitted with HCAP in setting of relapsed multiple myeloma on treatment with pomalidomide, dexamethasone, and daratumumab.  Slow improvement.  PCCM re-consulted 9/11 to evaluate pleural effusions as she is still hypoxic.  S/p b/l  thoracentesis  ASSESSMENT / PLAN:  PULMONARY A: Acute hypoxic respiratory failure - in the setting of HCAP, bilateral pleural effusions, atelectasis, severe malnutrition & decompensated dCHF Bilateral pleural effusions with atelectasis - s/p R thora 9/13 with 600 ml, L thora 9/14 with 600 ml removed.  Borderline extudative properties likely due to diuresis / chronicity of fluid, negative cytology on first throa Finished treatment for HCAP P:   Oxygen to keep SpO2 > 92% Intermittent CXR  Continue flutter valve, incentive spirometer to help with atelectasis Out of bed and ambulation with supervision Continue mucinex Optimize nutrition > prostat.  Will need ambulatory O2 assessment prior discharge > doubt she needs for d/c Continue budesonide, duonebs for now.  Doubt will need at discharge Monitor for reacumulation of pleural fluid Follow pleural fluid, follow cytology on 2nd tap   Relative Adrenal Insufficiency - on decadron at home, hypotensive after steroids stopped, improved with restart  P: D/c stress dose steroids, monitor BP closely   Anemia  P: PRBC per Heme  Soft BP - suspect related to diuresis P:  Monitor, likely to improve post administration of PRBC's Hold further lasix   PCCM will sign off. Please call back if new needs arise.    Noe Gens, NP-C [facility] Pgr: [phone] or if no answer [phone] 03/29/2016, 11:10 AM   Attending note: I have seen and examined the patient with nurse practitioner/resident om 9/14 and agree with the note. History, labs and imaging reviewed.  75 Y/O with PMH of MM on chemo, sepsis with HCAP, shock, realtive adrenal insufficiency. New systolic and diastolic cardiomyopathy B/L pleural effusions, atelectasis.  Feels better today after b/l thoracentesis. Off oxygen now. Pleural studies reviewed. They show the effusions are likely transudate. Cytology is negative except for reactive mesothelial cells.    All cultures are negative. CXR from 9/14 reviewed > shows significant improvement in effusions. No pneumothorax.  PCCM will be available as needed. Please call with any questions.  Rest of plan as above.  Marshell Garfinkel MD [facility] Pager [phone] If no answer or after 3pm call: [phone] 03/29/2016, 1:32 PM

## 2016-03-30 LAB — TYPE AND SCREEN
ABO/RH(D): A POS
Antibody Screen: POSITIVE
DAT, IgG: NEGATIVE
Donor AG Type: NEGATIVE
Donor AG Type: NEGATIVE
Unit division: 0
Unit division: 0

## 2016-03-30 LAB — ACID FAST SMEAR (AFB, MYCOBACTERIA): Acid Fast Smear: NEGATIVE

## 2016-03-31 LAB — BODY FLUID CULTURE: Culture: NO GROWTH

## 2016-04-01 LAB — BODY FLUID CULTURE: CULTURE: NO GROWTH

## 2016-04-02 ENCOUNTER — Telehealth: Payer: Self-pay | Admitting: *Deleted

## 2016-04-02 ENCOUNTER — Telehealth: Payer: Self-pay | Admitting: Hematology

## 2016-04-02 NOTE — Telephone Encounter (Signed)
I am not sure if we can refer to [facility] physical therapist. Could you check? I am see her tomorrow. Thanks   [name] MD

## 2016-04-02 NOTE — Telephone Encounter (Signed)
Patient called to see if 09/20 Appointment had been scheduled. 09/20 Appointment scheduled per 09/15 message with Dr. Burr Medico.

## 2016-04-02 NOTE — Telephone Encounter (Signed)
Patient called and left message.  Dr. Burr Medico suggested physical therapy and patient would like PT referral to Dr. Homero Fellers (sp?) at [facility] /number is [phone].  Patient is not sure if this is phone or fax.

## 2016-04-03 ENCOUNTER — Encounter: Payer: Self-pay | Admitting: Hematology

## 2016-04-03 ENCOUNTER — Other Ambulatory Visit (HOSPITAL_BASED_OUTPATIENT_CLINIC_OR_DEPARTMENT_OTHER): Payer: Medicare Other

## 2016-04-03 ENCOUNTER — Ambulatory Visit (HOSPITAL_BASED_OUTPATIENT_CLINIC_OR_DEPARTMENT_OTHER): Payer: Medicare Other | Admitting: Hematology

## 2016-04-03 VITALS — BP 105/50 | HR 81 | Temp 98.0°F | Resp 17 | Ht 66.0 in | Wt 116.6 lb

## 2016-04-03 DIAGNOSIS — I252 Old myocardial infarction: Secondary | ICD-10-CM

## 2016-04-03 DIAGNOSIS — D61818 Other pancytopenia: Secondary | ICD-10-CM

## 2016-04-03 DIAGNOSIS — M810 Age-related osteoporosis without current pathological fracture: Secondary | ICD-10-CM

## 2016-04-03 DIAGNOSIS — C9000 Multiple myeloma not having achieved remission: Secondary | ICD-10-CM

## 2016-04-03 DIAGNOSIS — C9002 Multiple myeloma in relapse: Secondary | ICD-10-CM

## 2016-04-03 LAB — CBC WITH DIFFERENTIAL/PLATELET
BASO%: 0.5 % (ref 0.0–2.0)
BASOS ABS: 0 10*3/uL (ref 0.0–0.1)
EOS ABS: 0 10*3/uL (ref 0.0–0.5)
EOS%: 0.5 % (ref 0.0–7.0)
HCT: 22.9 % — ABNORMAL LOW (ref 34.8–46.6)
HGB: 7.7 g/dL — ABNORMAL LOW (ref 11.6–15.9)
LYMPH%: 22.2 % (ref 14.0–49.7)
MCH: 32.4 pg (ref 25.1–34.0)
MCHC: 33.6 g/dL (ref 31.5–36.0)
MCV: 96.2 fL (ref 79.5–101.0)
MONO#: 0.2 10*3/uL (ref 0.1–0.9)
MONO%: 8.8 % (ref 0.0–14.0)
NEUT#: 1.5 10*3/uL (ref 1.5–6.5)
NEUT%: 68 % (ref 38.4–76.8)
NRBC: 0 % (ref 0–0)
PLATELETS: 39 10*3/uL — AB (ref 145–400)
RBC: 2.38 10*6/uL — AB (ref 3.70–5.45)
RDW: 15 % — ABNORMAL HIGH (ref 11.2–14.5)
WBC: 2.2 10*3/uL — ABNORMAL LOW (ref 3.9–10.3)
lymph#: 0.5 10*3/uL — ABNORMAL LOW (ref 0.9–3.3)

## 2016-04-03 LAB — COMPREHENSIVE METABOLIC PANEL
ALT: 17 U/L (ref 0–55)
ANION GAP: 7 meq/L (ref 3–11)
AST: 16 U/L (ref 5–34)
Albumin: 2.6 g/dL — ABNORMAL LOW (ref 3.5–5.0)
Alkaline Phosphatase: 112 U/L (ref 40–150)
BILIRUBIN TOTAL: 0.79 mg/dL (ref 0.20–1.20)
BUN: 17.7 mg/dL (ref 7.0–26.0)
CO2: 27 meq/L (ref 22–29)
Calcium: 8.5 mg/dL (ref 8.4–10.4)
Chloride: 106 mEq/L (ref 98–109)
Creatinine: 0.7 mg/dL (ref 0.6–1.1)
EGFR: 86 mL/min/{1.73_m2} — AB (ref >90)
Glucose: 107 mg/dl (ref 70–140)
Potassium: 3.9 mEq/L (ref 3.5–5.1)
Sodium: 139 mEq/L (ref 136–145)
TOTAL PROTEIN: 5.3 g/dL — AB (ref 6.4–8.3)

## 2016-04-03 NOTE — Progress Notes (Signed)
[place], [place]  [address]  DIAGNOSIS: Multiple myeloma with relapse  CHIEF COMPLAINS: Follow-up of multiple myeloma  CURRENT TREATMENT:   1. Pomalidomide 4 mg daily on day 1-21, dexamethasone 40 mg on day 1, 8 and 15, every 28 days, started on 02/27/2016, daratumumab weekly started on 03/07/2016, treatment on hold since 03/09/2016 due to hospitalization and severe cytopenia  2. Zometa 3.5 mg monthly started on 06/2013, on every 3 months now    Oncology History   Multiple myeloma [facility])   Staging form: Multiple Myeloma, AJCC 6th Edition   - Clinical: Stage IIIA - Signed by [name], MD on 09/04/2013      Multiple myeloma ([place])   05/26/2012 Initial Diagnosis    Presenting IgG was 4,040 mg/dL on 06/05/2012 (IgA 35; IgM 34); kappa free light chain 34.4 mg/dL, lambda 0.00, kappa:lambda ratio of 34.75; SPEP with M-spike of 2.60.      06/30/2012 Imaging    Numerous lytic lesions throughout the calvarium.  Lytic lesion within the left lateral scapula.  Findings most compatible with metastases or myeloma. Multiple mid and lower thoracic compression fractures.  Slight compression through the endplates at L3.      37/85/8850 Bone Marrow Biopsy    Bone marrow biopsy showed 49% plasma cell. Normal classical cytogenetics; however, myeloma FISH panel showed 13q- (intermediate risk)          07/14/2012 - 07/27/2012 Radiation Therapy    Palliative radiation 20 Gy over 10 fractions between to thoracic spine cord compression and symptomatic left scapula lytic lesion.      08/10/2012 -  Chemotherapy    Started SQ Velcade once weekly, 3 weeks on, 1 weeks off; daily Revlimid d1-21, 7 days off; and Dexamethasone 38m PO weekly.       02/12/2013 Bone Marrow Transplant    [facility]       06/14/2013 Tumor Marker    IgG  1830,  Kappa:lamba ratio, 1.69 (baseline was 34.75  or   M-spike 0.28 (baseline of 2.6 or  89.3% of baseline).        06/25/2013 -  Chemotherapy    Started zometa 3.5 mg monthly       09/01/2013 -  Chemotherapy    Maintenance therapy with revlimid 57mdaily for 14 days and then off for 7 (decreased from 21 days on and 7 days off based on neutropenia).       09/13/2013 - 09/17/2014 Hospital Admission    Hospital admission for pneumonia      12/20/2013 Treatment Plan Change    Maintenance therapy decreased to 2.5 mg daily for 21 days and then off for 7 days based on low counts.       01/25/2014 Tumor Marker    IgG 1360 M spike 0.5      03/01/2014 - 03/05/2014 Hospital Ad[facility], received thrombolytic therapy, cath showed 60% prox RCA, mid-distal RCA 50%, Echo normal, no stents.      04/11/2014 - 08/07/2014 Chemotherapy    Continue Revlimid at 2.5 mg 3 weeks on 1 week off      08/08/2014 - 08/13/2014 Hospital Admission    Hospital admission for pneumonia. Her Revlimid was held.      08/29/2014 - 09/14/2014 Chemotherapy    Maintenance Revlimid restarted      09/14/2014 - 09/17/2014  Hospital Admission    Admitted for pneumonia after coming back from a trip in [name]. Revlimid was held again.      11/13/2014 - 12/25/2015 Chemotherapy    Maintenance Revlimid restarted, changed to 2.9m daily, 2 weks on, 1 week off from 12/15/2014, stopped due to disease progression       01/24/2016 Progression    Patient is M protein has gradually increased to 1.0g, repeated a bone marrow biopsy showed plasma cell 10-30%      02/27/2016 -  Chemotherapy    Pomalidomide 4 mg daily on day 1-21, dexamethasone 40 mg on day 1, 8 and 15, every 28 days, started on 02/27/2016, daratumumab weekly started on 03/07/2016      03/10/2016 - 03/29/2016 Hospital Admission    Pt was admitted for sepsis from pneumonia, and severe pancytopenia. She required ICU stay for a few days due to hypotension, developed b/l  pleural effusion required diuretics and b/l thoracentesis. She also required blood and plt transfusion, and prolonged neupogen injection for severe neutropenia. She was discharged home after 3 weeks hospital stay       INTERVAL HISTORY:  CCARLA RASHAD75y.o. female with a past medical history of MM as detailed above, s/p SCT is here for follow-up.   CShonniereturns for follow up after her recent hospitalization. She was admitted for sepsis and pneumonia (see above). She was finally discharged home 5 days ago. She has been recovering well, she is able to cook sometime, walk for a few blocks, and bike for a short period of time. Her energy has been recovering well, cough and dyspnea near resolved. She is not using oxygen. No other new complains. She gained a few lbs back this week.    MEDICAL HISTORY: Past Medical History:  Diagnosis Date  . CAD (coronary artery disease)    a[place] [facility] [facility] ([facility]:  pRCA 60%, mid to dist RCA 50% >>> med rx  . Cord compression ([facility]   MRI- diffuse myeloma involvement of T-L spine  . History of radiation therapy 07/13/12-07/27/12   spinal cord compression T3-T10,left scapula  . Hx of echocardiogram    [facility]  Normal LVEF without significant regional wall motion abnormalities. Mild   . Multiple myeloma ([place] 07/01/2012  . OSTEOPOROSIS 06/11/2010   Multiple compression fractures; and spontaneous fracture of sternum Qualifier: Diagnosis of  By: SZebedee IbaNP, SManuela Schwartz    . Thoracic kyphosis 07/13/12   per MRI scan  . Unspecified deficiency anemia      ALLERGIES:  is allergic to zosyn [piperacillin sod-tazobactam so]; other; piperacillin-tazobactam in dex; zithromax [azithromycin]; and ciprofloxacin.  MEDICATIONS: has a current medication list which includes the following prescription(s): acetaminophen, atorvastatin, calcium carbonate,  clopidogrel, dexamethasone, fluticasone, furosemide, metoprolol succinate, nitroglycerin, ondansetron, proair hfa, prochlorperazine, acyclovir, and pomalidomide.  SURGICAL HISTORY:  Past Surgical History:  Procedure Laterality Date  . APPENDECTOMY    . CESAREAN SECTION     x2   . COLONOSCOPY  2007   neg with Dr. MWatt Climes . ELBOW SURGERY    . HIP SURGERY  2009   left  . TUBAL LIGATION      REVIEW OF SYSTEMS:   Constitutional: Denies abnormal weight loss, (+) fatigue Eyes: Denies blurriness of vision Ears, nose, mouth, throat, and face: Denies mucositis or sore throat, (+) left tooth pain  Respiratory: Denies cough, dyspnea or wheezes  Cardiovascular: Denies palpitation, chest discomfort or lower extremity swelling Gastrointestinal:  Denies nausea, heartburn or change in bowel habits Skin: Denies abnormal skin rashes Lymphatics: Denies new lymphadenopathy or easy bruising Neurological:Denies numbness, tingling or new weaknesses Behavioral/Psych: Mood is stable, no new changes  All other systems were reviewed with the patient and are negative.  PHYSICAL EXAMINATION: ECOG PERFORMANCE STATUS: 2 Blood pressure (!) 105/50, pulse 81, temperature 98 F (36.7 C), temperature source Oral, resp. rate 17, height 5' 6"  (1.676 m), weight 116 lb 9.6 oz (52.9 kg), SpO2 99 %. GENERAL:alert, no distress and comfortable; easily mobile to exam table; kyphosis, moderate.  SKIN: skin color, texture, turgor are normal, no rashes or significant lesions; Abrasion well healed on RLE.  EYES: normal, Conjunctiva are pink, 1 tear/injectionof blood vessel left eye in sclera. OROPHARYNX:no exudate, no erythema and lips, buccal mucosa, and tongue normal, no ONJ NECK: supple, thyroid normal size, non-tender, without nodularity; nodules cervical bilaterally.  LYMPH:  no palpable lymphadenopathy in the cervical, axillary or supraclavicular LUNGS: crackles at the bases bilaterally with normal breathing effort, no  wheezes or rhonchi HEART: regular rate & rhythm and no murmurs and no lower extremity edema ABDOMEN:abdomen soft, non-tender and normal bowel sounds Musculoskeletal:no cyanosis of digits and no clubbing, (+) spine scoliosis and mild tenderness  NEURO: alert & oriented x 3 with fluent speech, no focal motor/sensory deficits  Labs:  CBC Latest Ref Rng & Units 04/03/2016 03/29/2016 03/29/2016  WBC 3.9 - 10.3 10e3/uL 2.2(L) 4.4 3.6(L)  Hemoglobin 11.6 - 15.9 g/dL 7.7(L) 9.9(L) 7.2(L)  Hematocrit 34.8 - 46.6 % 22.9(L) 29.0(L) 20.6(L)  Platelets 145 - 400 10e3/uL 39(L) 43(L) 43(L)    CMP Latest Ref Rng & Units 04/03/2016 03/29/2016 03/28/2016  Glucose 70 - 140 mg/dl 107 94 98  BUN 7.0 - 26.0 mg/dL 17.7 34(H) 39(H)  Creatinine 0.6 - 1.1 mg/dL 0.7 0.48 0.52  Sodium 136 - 145 mEq/L 139 141 142  Potassium 3.5 - 5.1 mEq/L 3.9 3.6 3.8  Chloride 101 - 111 mmol/L - 106 107  CO2 22 - 29 mEq/L 27 33(H) 33(H)  Calcium 8.4 - 10.4 mg/dL 8.5 8.2(L) 8.1(L)  Total Protein 6.4 - 8.3 g/dL 5.3(L) - 4.2(L)  Total Bilirubin 0.20 - 1.20 mg/dL 0.79 - -  Alkaline Phos 40 - 150 U/L 112 - -  AST 5 - 34 U/L 16 - -  ALT 0 - 55 U/L 17 - -   SPEP M-PROTEIN 10/01/2013: 0.18 (nadir)  10/13/2014: 0.6 11/21/2014: 0.5 01/30/2015: 0.6 03/13/2015: 0.6 04/20/2015: 0.5 06/01/2015: 0.5 07/20/2015: 0.7 09/14/2015: 0.7 10/26/2015: 0.8 12/07/2015: 1.0 02/07/2016: 1.1   IgG ([phone]) 10/13/2014: 1710 11/21/2014: 1460 01/30/2015: 1380 03/13/15: 1570 04/20/2015: 1550 06/01/2015: 1590 07/22/2015: 1770 09/14/2015: 1787 10/26/2015: 1898 12/07/2015: 2045 02/07/2016: 1883  Serum kappa, lambda light chains, and ratio  11/21/2014: 3.95, 2.08, 1.90  01/30/2015: 3.92, 2.64, 1.48 03/13/2015: 3.55, 2.17, 1.64 04/20/15: 4.52, 1.78, 2.54 06/01/2015: 4.82, 1.75, 2.75 07/20/2015: 8.71, 2.43, 3.58 09/14/2015: 9.17, 2.02, 4.55 10/26/2015: 11.4, 1.75, 6.42 12/07/2015: 1.54, 1.83, 8.44 02/07/2016: 1.93, 1.42, 13.6  24 hr urine PEP 10/13/2014: (-)02/12/2016:  21m/24hr   PATHOLOGY REPORT  Diagnosis 01/24/2016 Bone Marrow, Aspirate,Biopsy, and Clot, RT iliac and core - VARIABLY CELLULAR BONE MARROW WITH PLASMA CELL NEOPLASM. - SEE COMMENT. PERIPHERAL BLOOD: - MACROCYTIC ANEMIA. Diagnosis Note The bone marrow is variably cellular with trilineage hematopoiesis and non specific myeloid changes. In this background, the plasma cell component is variable ranging from less than 10 % in many areas to  30% in some aspirate particles with relatively low cellularity. Foci of interstitial infiltrates and small clusters are also seen in the clot section. Immunohistochemical stains show kappa light chain restriction in atypical plasma cell clusters and infiltrates consistent with plasma cell neoplasm. Correlation with cytogenetic and FISH studies is recommended. (BNS:gt, 02/24/2016)   RADIOGRAPHIC STUDIES: None  ASSESSMENT: Shannon Rush 75 y.o. female with a history of Multiple Myeloma s/p autotransplant in August 2014 now on maintenence low dose Revlimid therapy  PLAN:   1. IgG kappa Multiple myeloma s/p Auto SCT, intermediate risk, relapsed in 01/2016 --MM markers demonstrate a very good response (M protein baseline 2.6, down to 0.18 after induction chemo and transplant, stable around 0.5 since July 2015, but has gradually increased lately, most recent 1.0 on 12/07/2015) -I discussed her repeated a bone marrow biopsy from 02/01/2016, which showed 10-30% plasma cells in her marrow -I agree with Dr. Kendell Bane recommendation to stop revlimid and change to Daratumumab, Pomalidomide and dexamethasone due to her disease relapse. I don't think she has resistance to Revlimid, she has been on very small dose of Revlimid due to her prior history of multiple infections. -She started first cycle DaraPD in 02/2016, but in 2 weeks she developed severe pancytopenia, pneumonia with sepsis required 3 weeks hospital stay. -Her cytopenia is recovering very slowly, lab reviewed  with her, I recommend 1u RBC for her in a few days -will need to modify her treatment when she recovers   2. Pancytopenia  -secondary to chemo  -slow recovery.  -1uRBC in 2 days  3. CHF, CAD, inferior STEMI in 02/2014 -continue plavix, metoprolol and lipitor  -follow up with Dr. Johnsie Cancel  -her recent echo showed EF 45-50% -her b/l pleural effusion much improved after lasix and thoracentesis, she is off lasix now   4. Osteoporosis -recently repeated bone density scan showed worsening osteoporosis, continue zometa, changed to every 3 months after 03/2015.  -she declined repeating DEXA  -continue Zometa every 3 months  5. Weight loss, deconditioning  -secondary to recent peumonia and hospital stay -I will refer her to outpt PT  -I encourage her to drink nutritional supplement    Plan -1U RBC in 2 days with lasix 31m iv once  -lab in one week -f/u and lab in 2 weeks    All questions were answered. The patient knows to call the clinic with any problems, questions or concerns. We can certainly see the patient much sooner if necessary.  I spent 30 minutes counseling the patient face to face. The total time spent in the appointment was 35 minutes.   FTruitt Merle 04/03/2016

## 2016-04-04 LAB — KAPPA/LAMBDA LIGHT CHAINS
Ig Kappa Free Light Chain: 9.6 mg/L (ref 3.3–19.4)
Ig Lambda Free Light Chain: 9.5 mg/L (ref 5.7–26.3)
KAPPA/LAMBDA FLC RATIO: 1.01 (ref 0.26–1.65)

## 2016-04-05 ENCOUNTER — Ambulatory Visit (HOSPITAL_COMMUNITY)
Admission: RE | Admit: 2016-04-05 | Discharge: 2016-04-05 | Disposition: A | Discharge status: Home / Self Care | Payer: Medicare Other | Source: Ambulatory Visit | Attending: Hematology | Admitting: Hematology

## 2016-04-05 ENCOUNTER — Other Ambulatory Visit: Payer: Self-pay | Admitting: Hematology

## 2016-04-05 ENCOUNTER — Ambulatory Visit: Payer: Medicare Other

## 2016-04-05 ENCOUNTER — Ambulatory Visit (HOSPITAL_BASED_OUTPATIENT_CLINIC_OR_DEPARTMENT_OTHER): Payer: Medicare Other

## 2016-04-05 ENCOUNTER — Other Ambulatory Visit: Payer: Self-pay | Admitting: *Deleted

## 2016-04-05 DIAGNOSIS — C9000 Multiple myeloma not having achieved remission: Secondary | ICD-10-CM | POA: Diagnosis not present

## 2016-04-05 DIAGNOSIS — C9002 Multiple myeloma in relapse: Secondary | ICD-10-CM | POA: Insufficient documentation

## 2016-04-05 DIAGNOSIS — D638 Anemia in other chronic diseases classified elsewhere: Secondary | ICD-10-CM | POA: Diagnosis not present

## 2016-04-05 LAB — PREPARE RBC (CROSSMATCH)

## 2016-04-05 MED ORDER — ACETAMINOPHEN 325 MG PO TABS
650.0000 mg | ORAL_TABLET | Freq: Once | ORAL | Status: AC
  Administered 2016-04-05: 650 mg via ORAL

## 2016-04-05 MED ORDER — SODIUM CHLORIDE 0.9 % IV SOLN: 250.0000 mL | Freq: Once | INTRAVENOUS | Status: DC

## 2016-04-05 MED ORDER — HEPARIN SOD (PORK) LOCK FLUSH 100 UNIT/ML IV SOLN
250.0000 [IU] | INTRAVENOUS | Status: DC | PRN
  Filled 2016-04-05: qty 5

## 2016-04-05 MED ORDER — SODIUM CHLORIDE 0.9 % IV SOLN
250.0000 mL | Freq: Once | INTRAVENOUS | Status: AC
  Administered 2016-04-05: 250 mL via INTRAVENOUS

## 2016-04-05 MED ORDER — HEPARIN SOD (PORK) LOCK FLUSH 100 UNIT/ML IV SOLN: 250.0000 [IU] | INTRAVENOUS | Status: DC | PRN

## 2016-04-05 MED ORDER — SODIUM CHLORIDE 0.9% FLUSH
10.0000 mL | INTRAVENOUS | Status: DC | PRN
  Filled 2016-04-05: qty 10

## 2016-04-05 MED ORDER — ACETAMINOPHEN 325 MG PO TABS
ORAL_TABLET | ORAL | Status: AC
Start: 2016-04-05 — End: 2016-04-05
  Filled 2016-04-05: qty 2

## 2016-04-05 MED ORDER — SODIUM CHLORIDE 0.9% FLUSH
3.0000 mL | INTRAVENOUS | Status: DC | PRN
  Filled 2016-04-05: qty 10

## 2016-04-05 MED ORDER — HEPARIN SOD (PORK) LOCK FLUSH 100 UNIT/ML IV SOLN: 500.0000 [IU] | Freq: Every day | INTRAVENOUS | Status: DC | PRN

## 2016-04-05 MED ORDER — SODIUM CHLORIDE 0.9% FLUSH: 3.0000 mL | INTRAVENOUS | Status: DC | PRN

## 2016-04-05 MED ORDER — ACETAMINOPHEN 325 MG PO TABS: 650.0000 mg | ORAL_TABLET | Freq: Once | ORAL | Status: DC

## 2016-04-05 MED ORDER — FUROSEMIDE 10 MG/ML IJ SOLN: 10.0000 mg | Freq: Once | INTRAMUSCULAR | Status: DC

## 2016-04-05 MED ORDER — HEPARIN SOD (PORK) LOCK FLUSH 100 UNIT/ML IV SOLN
500.0000 [IU] | Freq: Every day | INTRAVENOUS | Status: DC | PRN
  Filled 2016-04-05: qty 5

## 2016-04-05 MED ORDER — SODIUM CHLORIDE 0.9% FLUSH: 10.0000 mL | INTRAVENOUS | Status: DC | PRN

## 2016-04-05 NOTE — Patient Instructions (Signed)
Blood Transfusion, Care After °Refer to this sheet in the next few weeks. These instructions provide you with information about caring for yourself after your procedure. Your health care provider may also give you more specific instructions. Your treatment has been planned according to current medical practices, but problems sometimes occur. Call your health care provider if you have any problems or questions after your procedure. °WHAT TO EXPECT AFTER THE PROCEDURE °After your procedure, it is common to have: °· Bruising and soreness at the IV site. °· Chills or fever. °· Headache. °HOME CARE INSTRUCTIONS °· Take medicines only as directed by your health care provider. Ask your health care provider if you can take an over-the-counter pain reliever in case you have a fever or headache a day or two after your transfusion. °· Return to your normal activities as directed by your health care provider. °SEEK MEDICAL CARE IF:  °· You develop redness or irritation at your IV site. °· You have persistent fever, chills, or headache. °· Your urine is darker than normal. °· Your urine turns pink, red, or brown.   °· The white part of your eye turns yellow (jaundice).   °· You feel weak after doing your normal activities.   °SEEK IMMEDIATE MEDICAL CARE IF:  °· You have trouble breathing. °· You have fever and chills along with: °¨ Anxiety. °¨ Chest or back pain. °¨ Flushed skin. °¨ Clammy skin. °¨ A rapid heartbeat. °¨ Nausea. °  °This information is not intended to replace advice given to you by your health care provider. Make sure you discuss any questions you have with your health care provider. °  °Document Released: 07/22/2014 Document Reviewed: 07/22/2014 °Elsevier Interactive Patient Education ©2016 [employer] ° °

## 2016-04-05 NOTE — Addendum Note (Signed)
Addended by: [name] on: 04/05/2016 10:35 AM   Modules accepted: Orders, SmartSet

## 2016-04-06 LAB — TYPE AND SCREEN
ABO/RH(D): A POS
ANTIBODY SCREEN: POSITIVE
DAT, IgG: NEGATIVE
Donor AG Type: NEGATIVE
UNIT DIVISION: 0

## 2016-04-08 ENCOUNTER — Telehealth: Payer: Self-pay | Admitting: Pulmonary Disease

## 2016-04-08 DIAGNOSIS — C9000 Multiple myeloma not having achieved remission: Secondary | ICD-10-CM | POA: Diagnosis not present

## 2016-04-08 DIAGNOSIS — M6281 Muscle weakness (generalized): Secondary | ICD-10-CM | POA: Diagnosis not present

## 2016-04-08 LAB — MULTIPLE MYELOMA PANEL, SERUM
ALBUMIN SERPL ELPH-MCNC: 2.9 g/dL (ref 2.9–4.4)
ALPHA 1: 0.3 g/dL (ref 0.0–0.4)
Albumin/Glob SerPl: 1.4 (ref 0.7–1.7)
Alpha2 Glob SerPl Elph-Mcnc: 0.5 g/dL (ref 0.4–1.0)
B-Globulin SerPl Elph-Mcnc: 0.6 g/dL — ABNORMAL LOW (ref 0.7–1.3)
GAMMA GLOB SERPL ELPH-MCNC: 0.7 g/dL (ref 0.4–1.8)
GLOBULIN, TOTAL: 2.1 g/dL — AB (ref 2.2–3.9)
IGA/IMMUNOGLOBULIN A, SERUM: 22 mg/dL — AB (ref 64–422)
IgG, Qn, Serum: 787 mg/dL (ref 700–1600)
IgM, Qn, Serum: 21 mg/dL — ABNORMAL LOW (ref 26–217)
M PROTEIN SERPL ELPH-MCNC: 0.2 g/dL — AB
TOTAL PROTEIN: 5 g/dL — AB (ref 6.0–8.5)

## 2016-04-08 NOTE — Telephone Encounter (Signed)
lmtcb x1 for pt. 

## 2016-04-09 ENCOUNTER — Telehealth: Payer: Self-pay | Admitting: Hematology

## 2016-04-09 NOTE — Telephone Encounter (Signed)
Spoke with patient and confirmed 9/27 and 10/4 appointments

## 2016-04-09 NOTE — Telephone Encounter (Signed)
Patient returning call - pt doesn't need call back she states -pr

## 2016-04-09 NOTE — Telephone Encounter (Signed)
Noted  

## 2016-04-10 ENCOUNTER — Other Ambulatory Visit (HOSPITAL_BASED_OUTPATIENT_CLINIC_OR_DEPARTMENT_OTHER): Payer: Medicare Other

## 2016-04-10 DIAGNOSIS — C9000 Multiple myeloma not having achieved remission: Secondary | ICD-10-CM

## 2016-04-10 LAB — CBC WITH DIFFERENTIAL/PLATELET
BASO%: 0.7 % (ref 0.0–2.0)
BASOS ABS: 0 10*3/uL (ref 0.0–0.1)
EOS%: 0.5 % (ref 0.0–7.0)
Eosinophils Absolute: 0 10*3/uL (ref 0.0–0.5)
HCT: 26.4 % — ABNORMAL LOW (ref 34.8–46.6)
HGB: 8.7 g/dL — ABNORMAL LOW (ref 11.6–15.9)
LYMPH%: 17.3 % (ref 14.0–49.7)
MCH: 31.6 pg (ref 25.1–34.0)
MCHC: 32.8 g/dL (ref 31.5–36.0)
MCV: 96.4 fL (ref 79.5–101.0)
MONO#: 0.2 10*3/uL (ref 0.1–0.9)
MONO%: 9.3 % (ref 0.0–14.0)
NEUT#: 1.6 10*3/uL (ref 1.5–6.5)
NEUT%: 72.2 % (ref 38.4–76.8)
PLATELETS: 114 10*3/uL — AB (ref 145–400)
RBC: 2.74 10*6/uL — AB (ref 3.70–5.45)
RDW: 16 % — ABNORMAL HIGH (ref 11.2–14.5)
WBC: 2.2 10*3/uL — ABNORMAL LOW (ref 3.9–10.3)
lymph#: 0.4 10*3/uL — ABNORMAL LOW (ref 0.9–3.3)

## 2016-04-10 LAB — COMPREHENSIVE METABOLIC PANEL
ALT: 17 U/L (ref 0–55)
ANION GAP: 8 meq/L (ref 3–11)
AST: 18 U/L (ref 5–34)
Albumin: 3 g/dL — ABNORMAL LOW (ref 3.5–5.0)
Alkaline Phosphatase: 107 U/L (ref 40–150)
BUN: 18.8 mg/dL (ref 7.0–26.0)
CHLORIDE: 107 meq/L (ref 98–109)
CO2: 25 meq/L (ref 22–29)
CREATININE: 0.7 mg/dL (ref 0.6–1.1)
Calcium: 8.9 mg/dL (ref 8.4–10.4)
EGFR: 84 mL/min/{1.73_m2} — ABNORMAL LOW (ref >90)
Glucose: 88 mg/dl (ref 70–140)
POTASSIUM: 4 meq/L (ref 3.5–5.1)
Sodium: 140 mEq/L (ref 136–145)
Total Bilirubin: 0.69 mg/dL (ref 0.20–1.20)
Total Protein: 6 g/dL — ABNORMAL LOW (ref 6.4–8.3)

## 2016-04-12 LAB — UPEP/UIFE/LIGHT CHAINS/TP, 24-HR UR
% BETA, Urine: 39 %
ALBUMIN, U: 21 %
ALPHA 1 URINE: 1.4 %
ALPHA-2-GLOBULIN, U: 15.8 %
FREE LAMBDA LT CHAINS, UR: 1.38 mg/L (ref 0.24–6.66)
Free Kappa Lt Chains,Ur: 32.1 mg/L — ABNORMAL HIGH (ref 1.35–24.19)
GAMMA GLOBULIN URINE: 22.7 %
KAPPA/LAMBDA RATIO, U: 23.26 — AB (ref 2.04–10.37)
PDF, PE URINE: 0
PROTEIN 24H UR: 114 mg/(24.h) (ref 30–150)
PROTEIN,TOTAL,URINE: 10.4 mg/dL

## 2016-04-16 ENCOUNTER — Encounter: Payer: Self-pay | Admitting: Physician Assistant

## 2016-04-17 ENCOUNTER — Telehealth: Payer: Self-pay | Admitting: Hematology

## 2016-04-17 ENCOUNTER — Ambulatory Visit (HOSPITAL_BASED_OUTPATIENT_CLINIC_OR_DEPARTMENT_OTHER): Payer: Medicare Other | Admitting: Hematology

## 2016-04-17 ENCOUNTER — Ambulatory Visit (HOSPITAL_COMMUNITY)
Admission: RE | Admit: 2016-04-17 | Discharge: 2016-04-17 | Disposition: A | Discharge status: Home / Self Care | Payer: Medicare Other | Source: Ambulatory Visit | Attending: Hematology | Admitting: Hematology

## 2016-04-17 ENCOUNTER — Other Ambulatory Visit (HOSPITAL_BASED_OUTPATIENT_CLINIC_OR_DEPARTMENT_OTHER): Payer: Medicare Other

## 2016-04-17 VITALS — BP 108/64 | HR 90 | Temp 98.3°F | Resp 18 | Ht 66.0 in | Wt 119.0 lb

## 2016-04-17 DIAGNOSIS — M818 Other osteoporosis without current pathological fracture: Secondary | ICD-10-CM

## 2016-04-17 DIAGNOSIS — D61810 Antineoplastic chemotherapy induced pancytopenia: Secondary | ICD-10-CM | POA: Diagnosis not present

## 2016-04-17 DIAGNOSIS — C9002 Multiple myeloma in relapse: Secondary | ICD-10-CM | POA: Diagnosis not present

## 2016-04-17 DIAGNOSIS — C9000 Multiple myeloma not having achieved remission: Secondary | ICD-10-CM

## 2016-04-17 DIAGNOSIS — I252 Old myocardial infarction: Secondary | ICD-10-CM

## 2016-04-17 LAB — CBC WITH DIFFERENTIAL/PLATELET
BASO%: 0.4 % (ref 0.0–2.0)
BASOS ABS: 0 10*3/uL (ref 0.0–0.1)
EOS ABS: 0 10*3/uL (ref 0.0–0.5)
EOS%: 0.4 % (ref 0.0–7.0)
HCT: 24.5 % — ABNORMAL LOW (ref 34.8–46.6)
HEMOGLOBIN: 8 g/dL — AB (ref 11.6–15.9)
LYMPH%: 19.7 % (ref 14.0–49.7)
MCH: 32.4 pg (ref 25.1–34.0)
MCHC: 32.7 g/dL (ref 31.5–36.0)
MCV: 99.2 fL (ref 79.5–101.0)
MONO#: 0.3 10*3/uL (ref 0.1–0.9)
MONO%: 10.7 % (ref 0.0–14.0)
NEUT%: 68.8 % (ref 38.4–76.8)
NEUTROS ABS: 1.7 10*3/uL (ref 1.5–6.5)
PLATELETS: 111 10*3/uL — AB (ref 145–400)
RBC: 2.47 10*6/uL — ABNORMAL LOW (ref 3.70–5.45)
RDW: 19.8 % — AB (ref 11.2–14.5)
WBC: 2.4 10*3/uL — AB (ref 3.9–10.3)
lymph#: 0.5 10*3/uL — ABNORMAL LOW (ref 0.9–3.3)

## 2016-04-17 LAB — COMPREHENSIVE METABOLIC PANEL
ALBUMIN: 3.1 g/dL — AB (ref 3.5–5.0)
ALK PHOS: 88 U/L (ref 40–150)
ALT: 11 U/L (ref 0–55)
ANION GAP: 9 meq/L (ref 3–11)
AST: 19 U/L (ref 5–34)
BILIRUBIN TOTAL: 0.57 mg/dL (ref 0.20–1.20)
BUN: 18.8 mg/dL (ref 7.0–26.0)
CO2: 22 mEq/L (ref 22–29)
Calcium: 8.9 mg/dL (ref 8.4–10.4)
Chloride: 108 mEq/L (ref 98–109)
Creatinine: 0.7 mg/dL (ref 0.6–1.1)
EGFR: 86 mL/min/{1.73_m2} — AB (ref >90)
Glucose: 108 mg/dl (ref 70–140)
POTASSIUM: 4.1 meq/L (ref 3.5–5.1)
SODIUM: 140 meq/L (ref 136–145)
TOTAL PROTEIN: 5.9 g/dL — AB (ref 6.4–8.3)

## 2016-04-17 NOTE — Progress Notes (Signed)
Cardiology Office Note    Date:  04/18/2016   ID:  Shannon Rush, DOB [birth year], MRN [id number]  PCP:  Shannon Gottron, MD  Cardiologist:  Dr. Johnsie Rush  CC: post hospital follow up  History of Present Illness:  Shannon Rush is a 75 y.o. female with a history of multiple myeloma s/p bone marrow transplant (followed at [facility] by Dr. Alvie Rush and in [place] by Dr. Juliann Rush), osteoporosis, HLD and CAD: inferior STEMI s/p thrombolytics (02/2014) who presents to clinic for post hospital follow up.  Patient was seen by Dr. Johnsie Rush in 12/2012. There were reports of an abnormal echocardiogram. 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   Admitted 8/18-8/22/15 to the [facility] with an inferior STEMI. She received thrombolytics. Cardiac catheterization demonstrated 60% proximal RCA, mid to distal RCA 50% (lesion segmental and thrombotic). Echocardiogram demonstrated normal LV function. Patient was continued on heparin and aspirin. No intervention was performed. Recommendation was for the patient to return to [facility] and have a nuclear stress test in 6 weeks. Patient was on Revlimid (for pancytopenia from chemotherapy) and the DC summary from the hospital in [place] cites this as a possible cause for her STEMI.   She has a known hx of multiple myeloma since 2013 treated with Revlimid, recently switched to pomalidomide, dexamethasone, daratumumab with resultant pancytopenia. She was recently admitted from 8/28-9/15/17 for sepsis and acute respiratory failure 2/2 HCAP with acute on chronic systolic CHF with pleural effusions. The pt was shocky and required IVF, transfer to ICU, and IV pressors. She was noted to be thrombocytopenic and her Plavix was stopped. She improved initially but has had persistent hypoxia. Chest CT 03/24/16 showed moderate bilateral effusions. She  was given Lasix but this has been stopped secondary to hypotension. Echo done 03/24/16 showed an EF of 45-50% with grade 2 DD. She required bilateral thoracocentesis and IV diuresis. She also received blood transfusions with symptomatic improvement. Discharged on 63m Lasix PRN. Per Dr. NMeda Rush she will need a repeat 2D echo every 3 months while on this chemo regimen.  Today she presents to clinic for follow up. She has been feeling a lot better since discharge. She usually rides her bike here but she didn't feel quite up to it today. She is going to do a "test ride" later today. [facility] is much better. She saw her oncologist yesterday who got a chest x ray which was normal. No LE edema, orthopea or PND. NO  Dizziness or syncope.     Past Medical History:  Diagnosis Date  . CAD (coronary artery disease)    [place] [facility] ([place]:  pRCA 60%, mid to dist RCA 50% >>> med rx  . Cord [place] [place]   MRI- diffuse myeloma involvement of T-L spine  . History of radiation therapy 07/13/12-07/27/12   spinal cord compression T3-T10,left scapula  . Hx of echocardiogram    [facility]  Normal LVEF without significant regional wall motion abnormalities. Mild   . [place]  . OSTEOPOROSIS 06/11/2010   Multiple compression fractures; and spontaneous fracture of sternum Qualifier: Diagnosis of  By: SZebedee IbaNP, SManuela Schwartz    . Thoracic kyphosis 07/13/12   per MRI scan  . Unspecified deficiency  anemia     Past Surgical History:  Procedure Laterality Date  . APPENDECTOMY    . CESAREAN SECTION     x2   . COLONOSCOPY  2007   neg with Dr. Watt Rush  . ELBOW SURGERY    . HIP SURGERY  2009   left  . TUBAL LIGATION      Current Medications: Outpatient Medications Prior to Visit  Medication Sig Dispense Refill  . acetaminophen  (TYLENOL) 500 MG tablet Take 500 mg by mouth every 6 (six) hours as needed for moderate pain (pain). Reported on 07/20/2015    . acyclovir (ZOVIRAX) 400 MG tablet Take 1 tablet (400 mg total) by mouth 2 (two) times daily. 60 tablet 11  . atorvastatin (LIPITOR) 40 MG tablet TAKE 1 TABLET (40 MG TOTAL) BY MOUTH DAILY. 90 tablet 1  . calcium carbonate (OS-CAL) 600 MG TABS tablet Take 600 mg by mouth daily.    . clopidogrel (PLAVIX) 75 MG tablet Take 1 tablet (75 mg total) by mouth daily. 90 tablet 3  . fluticasone (FLONASE) 50 MCG/ACT nasal spray Place 2 sprays into both nostrils daily. 16 g 1  . metoprolol succinate (TOPROL-XL) 25 MG 24 hr tablet Take 1 tablet (25 mg total) by mouth daily. 90 tablet 3  . nitroGLYCERIN (NITROSTAT) 0.4 MG SL tablet Place under the tongue as needed. Reported on 07/20/2015    . ondansetron (ZOFRAN) 8 MG tablet Take 1 tablet (8 mg total) by mouth 2 (two) times daily as needed (Nausea or vomiting). 30 tablet 1  . PROAIR HFA 108 (90 BASE) MCG/ACT inhaler Inhale 2 puffs into the lungs every 6 (six) hours as needed.  2  . prochlorperazine (COMPAZINE) 10 MG tablet Take 1 tablet (10 mg total) by mouth every 6 (six) hours as needed (Nausea or vomiting). 30 tablet 1  . dexamethasone (DECADRON) 4 MG tablet Take 1 tablet (4 mg total) by mouth once a week. (Patient taking differently: Take 4-40 mg by mouth See admin instructions. ON TUESDAYS TAKE 40 MG THEN TAKE  4 MG TABLET ONCE DAILY FOR TWO DAYS STARTING THE DAY AFTER  CHEMOTHERAPY) 30 tablet 4  . furosemide (LASIX) 20 MG tablet Take 1 tablet (20 mg total) by mouth daily as needed. Take if leg swelling or and increase of 5 lb in weight 30 tablet 0  . pomalidomide (POMALYST) 4 MG capsule Take 1 capsule (4 mg total) by mouth daily. Take with water on days 1-21. Repeat every 28 days. (Patient not taking: Reported on 04/03/2016) 21 capsule 2   Facility-Administered Medications Prior to Visit  Medication Dose Route Frequency Provider Last  Rate Last Dose  . 0.9 %  sodium chloride infusion  250 mL Intravenous Once [name], MD      . 0.9 %  sodium chloride infusion  250 mL Intravenous Once [name], MD      . acetaminophen (TYLENOL) tablet 650 mg  650 mg Oral Once [name], MD      . acetaminophen (TYLENOL) tablet 650 mg  650 mg Oral Once [name], MD      . furosemide (LASIX) injection 10 mg  10 mg Intravenous Once [name], MD      . furosemide (LASIX) injection 10 mg  10 mg Intravenous Once [name], MD      . heparin lock flush 100 unit/mL  500 Units Intracatheter Daily PRN [name], MD      . heparin lock flush 100 unit/mL  250  Units Intracatheter PRN [name], MD      . heparin lock flush 100 unit/mL  500 Units Intracatheter Daily PRN [name], MD      . heparin lock flush 100 unit/mL  250 Units Intracatheter PRN [name], MD      . sodium chloride flush (NS) 0.9 % injection 10 mL  10 mL Intracatheter PRN [name], MD      . sodium chloride flush (NS) 0.9 % injection 10 mL  10 mL Intracatheter PRN [name], MD      . sodium chloride flush (NS) 0.9 % injection 10 mL  10 mL Intracatheter PRN [name], MD      . sodium chloride flush (NS) 0.9 % injection 3 mL  3 mL Intracatheter PRN [name], MD      . sodium chloride flush (NS) 0.9 % injection 3 mL  3 mL Intracatheter PRN [name], MD         Allergies:   Zosyn [piperacillin sod-tazobactam so]; Other; Piperacillin-tazobactam in dex; Zithromax [azithromycin]; and Ciprofloxacin   Social History   Social History  . Marital status: Married    Spouse name: Fritz Pickerel  . Number of children: 2  . Years of education: N/A   Occupational History  . Retired-Pyschology Faculty [employer]   Social History Main Topics  . Smoking status: Never Smoker  . Smokeless tobacco: Never Used  . Alcohol use 3.5 oz/week    7 Standard drinks or equivalent per week     Comment: 1 glass of wine a day  . Drug use: No  . Sexual activity: Not Asked   Other Topics Concern  . None    Social History Narrative   Health Care POA:    Emergency Contact: spouse, Theona Muhs [phone]   End of Life Plan:    Who lives with you: spouse, 2 story home   Any pets: Dog, "AKU"   Diet: Patient has a varied diet of protein, vegetables, starches.  Limits red meats.   Exercise: Patient exercises atleast 3 times a week for over an hour.   Seatbelts: Patient reports wearing her seatbelt when in vehicle.    Nancy Fetter Exposure/Protection: Patient reports wearing sunscreen daily.   Hobbies: Basketball, gardening, swimming, reading, biking           Family History:  The patient's family history includes Cancer in her brother; Glaucoma in her mother; Heart disease in her father and mother.     ROS:   Please see the history of present illness.    ROS All other systems reviewed and are negative.   PHYSICAL EXAM:   VS:  BP (!) 112/56   Pulse 84   Ht 5' 6"  (1.676 m)   Wt 119 lb 3.2 oz (54.1 kg)   BMI 19.24 kg/m    GEN: Well nourished, well developed, in no acute distress  HEENT: normal  Neck: no JVD, carotid bruits, or masses Cardiac: RRR; no murmurs, rubs, or gallops,no edema  Respiratory:  clear to auscultation bilaterally, normal work of breathing GI: soft, nontender, nondistended, + BS MS: no deformity or atrophy  Skin: warm and dry, no rash Neuro:  Alert and Oriented x 3, Strength and sensation are intact Psych: euthymic mood, full affect  Wt Readings from Last 3 Encounters:  04/18/16 119 lb 3.2 oz (54.1 kg)  04/17/16 119 lb (54 kg)  04/03/16 116 lb 9.6 oz (52.9 kg)      Studies/Labs Reviewed:   EKG:  EKG is NOT ordered today.   Recent Labs: 03/16/2016: Magnesium 1.7 03/25/2016: B Natriuretic Peptide 188.9 04/17/2016: ALT 11; BUN 18.8; Creatinine 0.7; HGB 8.0; Platelets 111; Potassium 4.1; Sodium 140   Lipid Panel    Component Value Date/Time   CHOL 207 (H) 06/02/2009 2018   TRIG 68 06/02/2009 2018   HDL 73 06/02/2009 2018   CHOLHDL 2.8 Ratio 06/02/2009 2018    VLDL 14 06/02/2009 2018   [place] 120 (H) 06/02/2009 2018    Additional studies/ records that were reviewed today include:  2D ECHO: 03/24/2016 LV EF: 45% -   50% Study Conclusions - Left ventricle: The cavity size was normal. Wall thickness was   normal. Systolic function was mildly reduced. The estimated   ejection fraction was in the range of 45% to 50%. Features are   consistent with a pseudonormal left ventricular filling pattern,   with concomitant abnormal relaxation and increased filling   pressure (grade 2 diastolic dysfunction). - Mitral valve: There was mild regurgitation. - Left atrium: The atrium was mildly dilated.    ASSESSMENT & PLAN:   Shannon Rush is a 75 y.o. female with a history of multiple myeloma s/p bone marrow transplant (followed at [facility] by Dr. Alvie Rush and in [place] by Dr. Juliann Rush), osteoporosis, HLD and CAD: inferior STEMI s/p thrombolytics (02/2014) who presents to clinic for post hospital follow up.  Chronic combined S/D CHF: appears euvolemic. Continue PRN Lasix. Will plan for repeat echo in mid December.   Multiple myeloma s/p bone marrow transplant: (followed at [facility] by Dr. Alvie Rush and in [facility] by Dr. Juliann Rush). Will follow 2D ECHO q 3 months per Dr. Eather Colas recs  HLD: continue statin  CAD: inferior STEMI s/p thrombolytics (02/2014). Plavix recent discontinued 2/2 thrombocytopenia but it was resumed at discharge. Continue statin and BB   Medication Adjustments/Labs and Tests Ordered: Current medicines are reviewed at length with the patient today.  Concerns regarding medicines are outlined above.  Medication changes, Labs and Tests ordered today are listed in the Patient Instructions below. Patient Instructions  Medication Instructions:  Your physician recommends that you continue on your current medications as directed. Please refer to the Current Medication list given to you today.  Labwork: None ordered  Testing/Procedures: Your  physician has requested that you have an echocardiogram AFTER 06/23/16. Echocardiography is a painless test that uses sound waves to create images of your heart. It provides your doctor with information about the size and shape of your heart and how well your heart's chambers and valves are working. This procedure takes approximately one hour. There are no restrictions for this procedure.    Follow-Up: Your physician recommends that you schedule a follow-up appointment in: 06/2016 AFTER THE ECHOCARDIOGRAM WITH DR. Johnsie Rush   Any Other Special Instructions Will Be Listed Below (If Applicable).  Echocardiogram An echocardiogram, or echocardiography, uses sound waves (ultrasound) to produce an image of your heart. The echocardiogram is simple, painless, obtained within a short period of time, and offers valuable information to your health care provider. The images from an echocardiogram can provide information such as:  Evidence of coronary artery disease (CAD).  Heart size.  Heart muscle function.  Heart valve function.  Aneurysm detection.  Evidence of a past heart attack.  Fluid buildup around the heart.  Heart muscle thickening.  Assess heart valve function. LET [facility] CARE PROVIDER KNOW ABOUT:  Any allergies you have.  All medicines you are taking, including vitamins, herbs, eye drops, creams, and  over-the-counter medicines.  Previous problems you or members of your family have had with the use of anesthetics.  Any blood disorders you have.  Previous surgeries you have had.  Medical conditions you have.  Possibility of pregnancy, if this applies. BEFORE THE PROCEDURE  No special preparation is needed. Eat and drink normally.  PROCEDURE   In order to produce an image of your heart, gel will be applied to your chest and a wand-like tool (transducer) will be moved over your chest. The gel will help transmit the sound waves from the transducer. The sound waves will  harmlessly bounce off your heart to allow the heart images to be captured in real-time motion. These images will then be recorded.  You may need an IV to receive a medicine that improves the quality of the pictures. AFTER THE PROCEDURE You may return to your normal schedule including diet, activities, and medicines, unless your health care provider tells you otherwise.   This information is not intended to replace advice given to you by your health care provider. Make sure you discuss any questions you have with your health care provider.   Document Released: 06/28/2000 Document Revised: 07/22/2014 Document Reviewed: 03/08/2013 Elsevier Interactive Patient Education [employer].    If you need a refill on your cardiac medications before your next appointment, please call your pharmacy.      Signed, Angelena Form, PA-C  04/18/2016 12:25 PM    [facility] [address], [place], [place]  [address] Phone: [phone]; Fax: [phone]

## 2016-04-17 NOTE — Progress Notes (Signed)
[place], [place] Shannon Rush [id number]  DIAGNOSIS: Multiple myeloma with relapse  CHIEF COMPLAINS: Follow-up of multiple myeloma  CURRENT TREATMENT:   1. Pomalidomide 4 mg daily on day 1-21, dexamethasone 40 mg on day 1, 8 and 15, every 28 days, started on 02/27/2016, daratumumab weekly started on 03/07/2016, treatment on hold since 03/09/2016 due to hospitalization and severe cytopenia 2. Zometa 3.5 mg monthly started on 06/2013, on every 3 months now    Oncology History   Multiple myeloma [facility])   Staging form: Multiple Myeloma, AJCC 6th Edition   - Clinical: Stage IIIA - Signed by [name], MD on 09/04/2013      Multiple myeloma ([place])   05/26/2012 Initial Diagnosis    Presenting IgG was 4,040 mg/dL on 06/05/2012 (IgA 35; IgM 34); kappa free light chain 34.4 mg/dL, lambda 0.00, kappa:lambda ratio of 34.75; SPEP with M-spike of 2.60.      06/30/2012 Imaging    Numerous lytic lesions throughout the calvarium.  Lytic lesion within the left lateral scapula.  Findings most compatible with metastases or myeloma. Multiple mid and lower thoracic compression fractures.  Slight compression through the endplates at L3.      81/82/9937 Bone Marrow Biopsy    Bone marrow biopsy showed 49% plasma cell. Normal classical cytogenetics; however, myeloma FISH panel showed 13q- (intermediate risk)          07/14/2012 - 07/27/2012 Radiation Therapy    Palliative radiation 20 Gy over 10 fractions between to thoracic spine cord compression and symptomatic left scapula lytic lesion.      08/10/2012 -  Chemotherapy    Started SQ Velcade once weekly, 3 weeks on, 1 weeks off; daily Revlimid d1-21, 7 days off; and Dexamethasone 2m PO weekly.       02/12/2013 Bone Marrow Transplant    [facility]       06/14/2013 Tumor Marker    IgG  1830,  Kappa:lamba ratio, 1.69 (baseline was 34.75  or   M-spike 0.28 (baseline of 2.6 or  89.3% of baseline).        06/25/2013 -  Chemotherapy    Started zometa 3.5 mg monthly       09/01/2013 -  Chemotherapy    Maintenance therapy with revlimid 541mdaily for 14 days and then off for 7 (decreased from 21 days on and 7 days off based on neutropenia).       09/13/2013 - 09/17/2014 Hospital Admission    Hospital admission for pneumonia      12/20/2013 Treatment Plan Change    Maintenance therapy decreased to 2.5 mg daily for 21 days and then off for 7 days based on low counts.       01/25/2014 Tumor Marker    IgG 1360 M spike 0.5      03/01/2014 - 03/05/2014 Hospital Ad[facility] received thrombolytic therapy, cath showed 60% prox RCA, mid-distal RCA 50%, Echo normal, no stents.      04/11/2014 - 08/07/2014 Chemotherapy    Continue Revlimid at 2.5 mg 3 weeks on 1 week off      08/08/2014 - 08/13/2014 Hospital Admission    Hospital admission for pneumonia. Her Revlimid was held.      08/29/2014 - 09/14/2014 Chemotherapy    Maintenance Revlimid restarted      09/14/2014 - 09/17/2014 Hospital  Admission    Admitted for pneumonia after coming back from a trip in [name]. Revlimid was held again.      11/13/2014 - 12/25/2015 Chemotherapy    Maintenance Revlimid restarted, changed to 2.4m daily, 2 weks on, 1 week off from 12/15/2014, stopped due to disease progression       01/24/2016 Progression    Patient is M protein has gradually increased to 1.0g, repeated a bone marrow biopsy showed plasma cell 10-30%      02/27/2016 -  Chemotherapy    Pomalidomide 4 mg daily on day 1-21, dexamethasone 40 mg on day 1, 8 and 15, every 28 days, started on 02/27/2016, daratumumab weekly started on 03/07/2016      03/10/2016 - 03/29/2016 Hospital Admission    Pt was admitted for sepsis from pneumonia, and severe pancytopenia. She required ICU stay for a few days due to hypotension, developed b/l  pleural effusion required diuretics and b/l thoracentesis. She also required blood and plt transfusion, and prolonged neupogen injection for severe neutropenia. She was discharged home after 3 weeks hospital stay       INTERVAL HISTORY:  Shannon [id number].o. female with a past medical history of MM as detailed above, s/p SCT is here for follow-up.   [name] been recovering well from her recent hospitalization. Her energy level is about 60-70% of her baseline, she is fully active at home, and goes out sometime, but has no been back to her for activities, such as going to school, or biking for long distance. She has been doing physical therapy at this gym regularly. Her cough was completely resolved after her hospital discharge, she has developed mild dry cough again in the past few days. No fever or chills, mild dyspnea on exertion. No chest pain or other complaints. No leg swelling.  Her appetite has improved some, she has gained 3 pounds in the past 2 weeks.   MEDICAL HISTORY: Past Medical History:  Diagnosis Date  . CAD (coronary artery disease)    [place]  pRCA 60%, mid to dist RCA 50% >>> med rx  . Cord compression [place]   MRI- diffuse myeloma involvement of T-L spine  . History of radiation therapy 07/13/12-07/27/12   spinal cord compression T3-T10,left scapula  . Hx of echocardiogram    [facility]  Normal LVEF without significant regional wall motion abnormalities. Mild   . [place] 07/01/2012  . OSTEOPOROSIS 06/11/2010   Multiple compression fractures; and spontaneous fracture of sternum Qualifier: Diagnosis of  By: SZebedee IbaNP, SManuela Schwartz    . Thoracic kyphosis 07/13/12   per MRI scan  . Unspecified deficiency anemia      ALLERGIES:  is allergic to zosyn [piperacillin sod-tazobactam so]; other; piperacillin-tazobactam in dex;  zithromax [azithromycin]; and ciprofloxacin.  MEDICATIONS: has a current medication list which includes the following prescription(s): acetaminophen, acyclovir, atorvastatin, calcium carbonate, clopidogrel, dexamethasone, fluticasone, furosemide, metoprolol succinate, nitroglycerin, ondansetron, pomalidomide, proair hfa, and prochlorperazine, and the following Facility-Administered Medications: sodium chloride, sodium chloride, acetaminophen, acetaminophen, furosemide, furosemide, heparin lock flush, heparin lock flush, heparin lock flush, heparin lock flush, sodium chloride flush, sodium chloride flush, sodium chloride flush, sodium chloride flush, and sodium chloride flush.  SURGICAL HISTORY:  Past Surgical History:  Procedure Laterality Date  . APPENDECTOMY    . CESAREAN SECTION     x2   .  COLONOSCOPY  2007   neg with Dr. [name]  . ELBOW SURGERY    . HIP SURGERY  2009   left  . TUBAL LIGATION      REVIEW OF SYSTEMS:   Constitutional: Denies abnormal weight loss, (+) fatigue Eyes: Denies blurriness of vision Ears, nose, mouth, throat, and face: Denies mucositis or sore throat, (+) left tooth pain  Respiratory: Denies cough, dyspnea or wheezes Cardiovascular: Denies palpitation, chest discomfort or lower extremity swelling Gastrointestinal:  Denies nausea, heartburn or change in bowel habits Skin: Denies abnormal skin rashes Lymphatics: Denies new lymphadenopathy or easy bruising Neurological:Denies numbness, tingling or new weaknesses Behavioral/Psych: Mood is stable, no new changes  All other systems were reviewed with the patient and are negative.  PHYSICAL EXAMINATION: ECOG PERFORMANCE STATUS: 1 Blood pressure 108/64, pulse 90, temperature 98.3 F (36.8 C), temperature source Oral, resp. rate 18, height 5' 6"  (1.676 m), weight 119 lb (54 kg), SpO2 100 %. GENERAL:alert, no distress and comfortable; easily mobile to exam table; kyphosis, moderate.  SKIN: skin color, texture,  turgor are normal, no rashes or significant lesions; Abrasion well healed on RLE.  EYES: normal, Conjunctiva are pink, 1 tear/injectionof blood vessel left eye in sclera. OROPHARYNX:no exudate, no erythema and lips, buccal mucosa, and tongue normal, no ONJ NECK: supple, thyroid normal size, non-tender, without nodularity; nodules cervical bilaterally.  LYMPH:  no palpable lymphadenopathy in the cervical, axillary or supraclavicular LUNGS: crackles at the bases bilaterally with normal breathing effort, no wheezes or rhonchi HEART: regular rate & rhythm and no murmurs and no lower extremity edema ABDOMEN:abdomen soft, non-tender and normal bowel sounds Musculoskeletal:no cyanosis of digits and no clubbing, (+) spine scoliosis and mild tenderness  NEURO: alert & oriented x 3 with fluent speech, no focal motor/sensory deficits  Labs:  CBC Latest Ref Rng & Units 04/17/2016 04/10/2016 04/03/2016  WBC 3.9 - 10.3 10e3/uL 2.4(L) 2.2(L) 2.2(L)  Hemoglobin 11.6 - 15.9 g/dL 8.0(L) 8.7(L) 7.7(L)  Hematocrit 34.8 - 46.6 % 24.5(L) 26.4(L) 22.9(L)  Platelets 145 - 400 10e3/uL 111(L) 114(L) 39(L)    CMP Latest Ref Rng & Units 04/17/2016 04/10/2016 04/03/2016  Glucose 70 - 140 mg/dl 108 88 107  BUN 7.0 - 26.0 mg/dL 18.8 18.8 17.7  Creatinine 0.6 - 1.1 mg/dL 0.7 0.7 0.7  Sodium 136 - 145 mEq/L 140 140 139  Potassium 3.5 - 5.1 mEq/L 4.1 4.0 3.9  Chloride 101 - 111 mmol/L - - -  CO2 22 - 29 mEq/L 22 25 27   Calcium 8.4 - 10.4 mg/dL 8.9 8.9 8.5  Total Protein 6.4 - 8.3 g/dL 5.9(L) 6.0(L) 5.0(L)  Total Bilirubin 0.20 - 1.20 mg/dL 0.57 0.69 0.79  Alkaline Phos 40 - 150 U/L 88 107 112  AST 5 - 34 U/L 19 18 16   ALT 0 - 55 U/L 11 17 17    SPEP M-PROTEIN 10/01/2013: 0.18 (nadir)  10/13/2014: 0.6 11/21/2014: 0.5 01/30/2015: 0.6 03/13/2015: 0.6 04/20/2015: 0.5 06/01/2015: 0.5 07/20/2015: 0.7 09/14/2015: 0.7 10/26/2015: 0.8 12/07/2015: 1.0 02/07/2016: 1.1  04/03/2016: 0.2  IgG ([id number]) 10/13/2014: 1710 11/21/2014:  1460 01/30/2015: 1380 03/13/15: 1570 04/20/2015: 1550 06/01/2015: 1590 07/22/2015: 1770 09/14/2015: 1787 10/26/2015: 1898 12/07/2015: 2045 02/07/2016: 1883 04/03/2016: 787  Serum kappa, lambda light chains, and ratio  11/21/2014: 3.95, 2.08, 1.90  01/30/2015: 3.92, 2.64, 1.48 03/13/2015: 3.55, 2.17, 1.64 04/20/15: 4.52, 1.78, 2.54 06/01/2015: 4.82, 1.75, 2.75 07/20/2015: 8.71, 2.43, 3.58 09/14/2015: 9.17, 2.02, 4.55 10/26/2015: 11.4, 1.75, 6.42 12/07/2015: 1.54, 1.83, 8.44 02/07/2016: 1.93, 1.42, 13.6 04/03/2016: 0.96, 0.95,  1.01   24 hr urine PEP (total protein, M-spike) 10/13/2014: (-) 02/12/2016: 285m, 650m9/22/2017: 114 mg, (-)  PATHOLOGY REPORT  Diagnosis 01/24/2016 Bone Marrow, Aspirate,Biopsy, and Clot, RT iliac and core - VARIABLY CELLULAR BONE MARROW WITH PLASMA CELL NEOPLASM. - SEE COMMENT. PERIPHERAL BLOOD: - MACROCYTIC ANEMIA. Diagnosis Note The bone marrow is variably cellular with trilineage hematopoiesis and non specific myeloid changes. In this background, the plasma cell component is variable ranging from less than 10 % in many areas to 30% in some aspirate particles with relatively low cellularity. Foci of interstitial infiltrates and small clusters are also seen in the clot section. Immunohistochemical stains show kappa light chain restriction in atypical plasma cell clusters and infiltrates consistent with plasma cell neoplasm. Correlation with cytogenetic and FISH studies is recommended. (BNS:gt, 7/08-02-17  RADIOGRAPHIC STUDIES: None  ASSESSMENT: ClWESLYN HOLSONBACK89.o. female with a history of Multiple Myeloma s/p autotransplant in August 2014 now on maintenence low dose Revlimid therapy, and relapsed in 01/2016  PLAN:   1. IgG kappa Multiple myeloma s/p Auto SCT, intermediate risk, relapsed in 01/2016 --MM markers demonstrate a very good initial response (M protein baseline 2.6, down to 0.18 after induction chemo and transplant, stable around 0.5 since July 2015, but has  gradually increased lately, most recent 1.0 on 12/07/2015) -I discussed her repeated bone marrow biopsy from 02/01/2016, which showed 10-30% plasma cells in her marrow -I agree with Dr. GaKendell Baneecommendation to stop revlimid and change to Daratumumab, Pomalidomide and dexamethasone due to her disease relapse. I don't think she has resistance to Revlimid, she has been on very small dose of Revlimid due to her prior history of multiple infections. -She started first cycle DaraPD in 02/2016, but in 2 weeks she developed severe pancytopenia, pneumonia with sepsis required 3 weeks hospital stay. She did have a very nice response to the treatment, her M-protein has significantly reduced from 1.2 to 0.2, light chain levels normalized even with half cycle treatment  -Her cytopenia have recovered well now, I recommend her to restart therapy next week -Due to her significant pancytopenia, and the risk of infection, I recommend her to change regiment to DaraVD, and will start her on dara every 2 weeks for 2 doses, then change to full dose if she tolerate well  -She previously tolerated located very well, no significant neuropath. . -She is mildly dry cough, and a mild dyspnea on exertion. I'll obtain a repeat his chest x-ray to follow up her pneumonia, and rule out recurrent pleural effusion.  2. Pancytopenia  -secondary to chemo  -Much improved and now  3. CHF, CAD, inferior STEMI in 02/2014 -continue plavix, metoprolol and lipitor  -follow up with Dr. NiJohnsie Cancel-her recent echo showed EF 45-50% -her b/l pleural effusion much improved after lasix and thoracentesis, she is off lasix now   4. Osteoporosis -recently repeated bone density scan showed worsening osteoporosis, continue zometa, changed to every 3 months after 03/2015.  -she declined repeating DEXA  -continue Zometa every 3 months  5. Weight loss, deconditioning  -secondary to recent peumonia and hospital stay, improving -Continue  outpt PT   -I encourage her to drink nutritional supplement    Plan -start daratumumab next week, I will see her before infusion  -CXR today     All questions were answered. The patient knows to call the clinic with any problems, questions or concerns. We can certainly see the patient much sooner if necessary.  I spent 20 minutes  counseling the patient face to face. The total time spent in the appointment was 25 minutes.   [name]  04/17/2016   Addendum Her chest x-ray was negative. I called her later today and discuss the result.  [name]

## 2016-04-17 NOTE — Telephone Encounter (Signed)
Gave patient avs report and appointments. Patient sent to [facility] rad for cxr

## 2016-04-18 ENCOUNTER — Ambulatory Visit (INDEPENDENT_AMBULATORY_CARE_PROVIDER_SITE_OTHER): Payer: Medicare Other | Admitting: Physician Assistant

## 2016-04-18 ENCOUNTER — Encounter: Payer: Self-pay | Admitting: Physician Assistant

## 2016-04-18 VITALS — BP 112/56 | HR 84 | Ht 66.0 in | Wt 119.2 lb

## 2016-04-18 DIAGNOSIS — C9000 Multiple myeloma not having achieved remission: Secondary | ICD-10-CM

## 2016-04-18 DIAGNOSIS — E785 Hyperlipidemia, unspecified: Secondary | ICD-10-CM | POA: Diagnosis not present

## 2016-04-18 DIAGNOSIS — I5022 Chronic systolic (congestive) heart failure: Secondary | ICD-10-CM | POA: Diagnosis not present

## 2016-04-18 DIAGNOSIS — I2510 Atherosclerotic heart disease of native coronary artery without angina pectoris: Secondary | ICD-10-CM

## 2016-04-18 NOTE — Patient Instructions (Signed)
Medication Instructions:  Your physician recommends that you continue on your current medications as directed. Please refer to the Current Medication list given to you today.  Labwork: None ordered  Testing/Procedures: Your physician has requested that you have an echocardiogram AFTER 06/23/16. Echocardiography is a painless test that uses sound waves to create images of your heart. It provides your doctor with information about the size and shape of your heart and how well your heart's chambers and valves are working. This procedure takes approximately one hour. There are no restrictions for this procedure.    Follow-Up: Your physician recommends that you schedule a follow-up appointment in: 06/2016 AFTER THE ECHOCARDIOGRAM WITH DR. Johnsie Cancel   Any Other Special Instructions Will Be Listed Below (If Applicable).  Echocardiogram An echocardiogram, or echocardiography, uses sound waves (ultrasound) to produce an image of your heart. The echocardiogram is simple, painless, obtained within a short period of time, and offers valuable information to your health care provider. The images from an echocardiogram can provide information such as:  Evidence of coronary artery disease (CAD).  Heart size.  Heart muscle function.  Heart valve function.  Aneurysm detection.  Evidence of a past heart attack.  Fluid buildup around the heart.  Heart muscle thickening.  Assess heart valve function. LET [facility] CARE PROVIDER KNOW ABOUT:  Any allergies you have.  All medicines you are taking, including vitamins, herbs, eye drops, creams, and over-the-counter medicines.  Previous problems you or members of your family have had with the use of anesthetics.  Any blood disorders you have.  Previous surgeries you have had.  Medical conditions you have.  Possibility of pregnancy, if this applies. BEFORE THE PROCEDURE  No special preparation is needed. Eat and drink normally.  PROCEDURE    In order to produce an image of your heart, gel will be applied to your chest and a wand-like tool (transducer) will be moved over your chest. The gel will help transmit the sound waves from the transducer. The sound waves will harmlessly bounce off your heart to allow the heart images to be captured in real-time motion. These images will then be recorded.  You may need an IV to receive a medicine that improves the quality of the pictures. AFTER THE PROCEDURE You may return to your normal schedule including diet, activities, and medicines, unless your health care provider tells you otherwise.   This information is not intended to replace advice given to you by your health care provider. Make sure you discuss any questions you have with your health care provider.   Document Released: 06/28/2000 Document Revised: 07/22/2014 Document Reviewed: 03/08/2013 Elsevier Interactive Patient Education [employer].    If you need a refill on your cardiac medications before your next appointment, please call your pharmacy.

## 2016-04-21 ENCOUNTER — Encounter: Payer: Self-pay | Admitting: Hematology

## 2016-04-25 LAB — FUNGUS CULTURE WITH STAIN

## 2016-04-25 LAB — FUNGUS CULTURE RESULT

## 2016-04-25 LAB — FUNGAL ORGANISM REFLEX

## 2016-04-26 ENCOUNTER — Ambulatory Visit (HOSPITAL_BASED_OUTPATIENT_CLINIC_OR_DEPARTMENT_OTHER): Payer: Medicare Other

## 2016-04-26 ENCOUNTER — Ambulatory Visit (HOSPITAL_BASED_OUTPATIENT_CLINIC_OR_DEPARTMENT_OTHER): Payer: Medicare Other | Admitting: Hematology

## 2016-04-26 ENCOUNTER — Telehealth: Payer: Self-pay | Admitting: Hematology

## 2016-04-26 ENCOUNTER — Other Ambulatory Visit (HOSPITAL_BASED_OUTPATIENT_CLINIC_OR_DEPARTMENT_OTHER): Payer: Medicare Other

## 2016-04-26 ENCOUNTER — Encounter: Payer: Self-pay | Admitting: Hematology

## 2016-04-26 VITALS — BP 118/68 | HR 88 | Temp 97.8°F | Resp 17 | Ht 66.0 in | Wt 123.1 lb

## 2016-04-26 VITALS — BP 111/66 | HR 76 | Temp 98.1°F | Resp 18

## 2016-04-26 DIAGNOSIS — Z5112 Encounter for antineoplastic immunotherapy: Secondary | ICD-10-CM

## 2016-04-26 DIAGNOSIS — C9002 Multiple myeloma in relapse: Secondary | ICD-10-CM | POA: Diagnosis not present

## 2016-04-26 DIAGNOSIS — C9000 Multiple myeloma not having achieved remission: Secondary | ICD-10-CM

## 2016-04-26 DIAGNOSIS — I252 Old myocardial infarction: Secondary | ICD-10-CM

## 2016-04-26 DIAGNOSIS — M818 Other osteoporosis without current pathological fracture: Secondary | ICD-10-CM | POA: Diagnosis not present

## 2016-04-26 DIAGNOSIS — I5022 Chronic systolic (congestive) heart failure: Secondary | ICD-10-CM | POA: Diagnosis not present

## 2016-04-26 DIAGNOSIS — D61818 Other pancytopenia: Secondary | ICD-10-CM

## 2016-04-26 DIAGNOSIS — I509 Heart failure, unspecified: Secondary | ICD-10-CM | POA: Insufficient documentation

## 2016-04-26 LAB — CBC WITH DIFFERENTIAL/PLATELET
BASO%: 0.7 % (ref 0.0–2.0)
BASOS ABS: 0 10*3/uL (ref 0.0–0.1)
EOS ABS: 0.1 10*3/uL (ref 0.0–0.5)
EOS%: 1.7 % (ref 0.0–7.0)
HCT: 26.1 % — ABNORMAL LOW (ref 34.8–46.6)
HEMOGLOBIN: 8.5 g/dL — AB (ref 11.6–15.9)
LYMPH%: 11.8 % — AB (ref 14.0–49.7)
MCH: 33.3 pg (ref 25.1–34.0)
MCHC: 32.7 g/dL (ref 31.5–36.0)
MCV: 101.8 fL — AB (ref 79.5–101.0)
MONO#: 0.3 10*3/uL (ref 0.1–0.9)
MONO%: 10.4 % (ref 0.0–14.0)
NEUT#: 2.5 10*3/uL (ref 1.5–6.5)
NEUT%: 75.4 % (ref 38.4–76.8)
Platelets: 141 10*3/uL — ABNORMAL LOW (ref 145–400)
RBC: 2.56 10*6/uL — ABNORMAL LOW (ref 3.70–5.45)
RDW: 25.9 % — AB (ref 11.2–14.5)
WBC: 3.3 10*3/uL — ABNORMAL LOW (ref 3.9–10.3)
lymph#: 0.4 10*3/uL — ABNORMAL LOW (ref 0.9–3.3)

## 2016-04-26 LAB — COMPREHENSIVE METABOLIC PANEL
ALBUMIN: 3.2 g/dL — AB (ref 3.5–5.0)
ALK PHOS: 80 U/L (ref 40–150)
ALT: 10 U/L (ref 0–55)
AST: 15 U/L (ref 5–34)
Anion Gap: 8 mEq/L (ref 3–11)
BUN: 14.9 mg/dL (ref 7.0–26.0)
CO2: 23 mEq/L (ref 22–29)
Calcium: 8.8 mg/dL (ref 8.4–10.4)
Chloride: 109 mEq/L (ref 98–109)
Creatinine: 0.7 mg/dL (ref 0.6–1.1)
EGFR: 86 mL/min/{1.73_m2} — AB (ref >90)
GLUCOSE: 102 mg/dL (ref 70–140)
POTASSIUM: 3.9 meq/L (ref 3.5–5.1)
SODIUM: 140 meq/L (ref 136–145)
Total Bilirubin: 0.69 mg/dL (ref 0.20–1.20)
Total Protein: 5.9 g/dL — ABNORMAL LOW (ref 6.4–8.3)

## 2016-04-26 MED ORDER — METHYLPREDNISOLONE SODIUM SUCC 125 MG IJ SOLR
125.0000 mg | Freq: Once | INTRAMUSCULAR | Status: AC
  Administered 2016-04-26: 125 mg via INTRAVENOUS

## 2016-04-26 MED ORDER — ACETAMINOPHEN 325 MG PO TABS
650.0000 mg | ORAL_TABLET | Freq: Once | ORAL | Status: AC
  Administered 2016-04-26: 650 mg via ORAL

## 2016-04-26 MED ORDER — SODIUM CHLORIDE 0.9 % IV SOLN
Freq: Once | INTRAVENOUS | Status: AC
  Administered 2016-04-26: 10:00:00 via INTRAVENOUS

## 2016-04-26 MED ORDER — MONTELUKAST SODIUM 10 MG PO TABS
10.0000 mg | ORAL_TABLET | Freq: Once | ORAL | Status: AC
  Administered 2016-04-26: 10 mg via ORAL
  Filled 2016-04-26: qty 1

## 2016-04-26 MED ORDER — DIPHENHYDRAMINE HCL 25 MG PO CAPS
ORAL_CAPSULE | ORAL | Status: AC
  Filled 2016-04-26: qty 2

## 2016-04-26 MED ORDER — ACETAMINOPHEN 325 MG PO TABS
ORAL_TABLET | ORAL | Status: AC
  Filled 2016-04-26: qty 2

## 2016-04-26 MED ORDER — PROCHLORPERAZINE MALEATE 10 MG PO TABS
ORAL_TABLET | ORAL | Status: AC
  Filled 2016-04-26: qty 1

## 2016-04-26 MED ORDER — DIPHENHYDRAMINE HCL 25 MG PO CAPS
50.0000 mg | ORAL_CAPSULE | Freq: Once | ORAL | Status: AC
  Administered 2016-04-26: 50 mg via ORAL

## 2016-04-26 MED ORDER — PROCHLORPERAZINE MALEATE 10 MG PO TABS
10.0000 mg | ORAL_TABLET | Freq: Once | ORAL | Status: AC
  Administered 2016-04-26: 10 mg via ORAL

## 2016-04-26 MED ORDER — DARATUMUMAB CHEMO INJECTION 400 MG/20ML
16.0000 mg/kg | Freq: Once | INTRAVENOUS | Status: AC
  Administered 2016-04-26: 900 mg via INTRAVENOUS
  Filled 2016-04-26: qty 20

## 2016-04-26 MED ORDER — METHYLPREDNISOLONE SODIUM SUCC 125 MG IJ SOLR
INTRAMUSCULAR | Status: AC
  Filled 2016-04-26: qty 2

## 2016-04-26 NOTE — Addendum Note (Signed)
Addended by: San Morelle on: 04/26/2016 09:50 AM   Modules accepted: Orders

## 2016-04-26 NOTE — Progress Notes (Signed)
Darzalex line was clamped during the first portion of infusion. Per Dr. Burr Medico, it is ok to run infusion at 50 ml for 30 minutes, 100 ml for 1h, 150 ml for 1h, and then 200 ml per hour for the remainder of infusion as long as patient tolerates these rates.

## 2016-04-26 NOTE — Telephone Encounter (Signed)
Left message for patient re next appointments for 10/27. Schedule mailed.

## 2016-04-26 NOTE — Progress Notes (Signed)
[place], MD Shannon Rush [place] [address]  DIAGNOSIS: Multiple myeloma with relapse  CHIEF COMPLAINS: Follow-up of multiple myeloma  CURRENT TREATMENT:   1. Pomalidomide 4 mg daily on day 1-21, dexamethasone 40 mg on day 1, 8 and 15, every 28 days, started on 02/27/2016, daratumumab weekly started on 03/07/2016, treatment on hold since 03/09/2016 due to hospitalization and severe cytopenia 2. Daratumumab restarted on 04/26/2016 2. Zometa 3.5 mg monthly started on 06/2013, on every 3 months now, last 02/07/2016   Oncology History   Multiple myeloma Shannon Rush)   Staging form: Multiple Myeloma, AJCC 6th Edition   - Clinical: Stage IIIA - Signed by [name], MD on 09/04/2013      Multiple myeloma ([facility])   05/26/2012 Initial Diagnosis    Presenting IgG was 4,040 mg/dL on 06/05/2012 (IgA 35; IgM 34); kappa free light chain 34.4 mg/dL, lambda 0.00, kappa:lambda ratio of 34.75; SPEP with M-spike of 2.60.      06/30/2012 Imaging    Numerous lytic lesions throughout the calvarium.  Lytic lesion within the left lateral scapula.  Findings most compatible with metastases or myeloma. Multiple mid and lower thoracic compression fractures.  Slight compression through the endplates at L3.      76/14/7092 Bone Marrow Biopsy    Bone marrow biopsy showed 49% plasma cell. Normal classical cytogenetics; however, myeloma FISH panel showed 13q- (intermediate risk)          07/14/2012 - 07/27/2012 Radiation Therapy    Palliative radiation 20 Gy over 10 fractions between to thoracic spine cord compression and symptomatic left scapula lytic lesion.      08/10/2012 -  Chemotherapy    Started SQ Velcade once weekly, 3 weeks on, 1 weeks off; daily Revlimid d1-21, 7 days off; and Dexamethasone 11m PO weekly.       02/12/2013 Bone Marrow Transplant    [facility]       06/14/2013 Tumor Marker     IgG  1830,  Kappa:lamba ratio, 1.69 (baseline was 34.75 or   M-spike 0.28 (baseline of 2.6 or  89.3% of baseline).        06/25/2013 -  Chemotherapy    Started zometa 3.5 mg monthly       09/01/2013 -  Chemotherapy    Maintenance therapy with revlimid 531mdaily for 14 days and then off for 7 (decreased from 21 days on and 7 days off based on neutropenia).       09/13/2013 - 09/17/2014 Hospital Admission    Hospital admission for pneumonia      12/20/2013 Treatment Plan Change    Maintenance therapy decreased to 2.5 mg daily for 21 days and then off for 7 days based on low counts.       01/25/2014 Tumor Marker    IgG 1360 M spike 0.5      03/01/2014 - 03/05/2014 Hospital Admission    [facility], received thrombolytic therapy, cath showed 60% prox RCA, mid-distal RCA 50%, Echo normal, no stents.      04/11/2014 - 08/07/2014 Chemotherapy    Continue Revlimid at 2.5 mg 3 weeks on 1 week off      08/08/2014 - 08/13/2014 Hospital Admission    Hospital admission for pneumonia. Her Revlimid was held.      08/29/2014 - 09/14/2014 Chemotherapy    Maintenance Revlimid restarted  09/14/2014 - 09/17/2014 Hospital Admission    Admitted for pneumonia after coming back from a trip in [place]. Revlimid was held again.      11/13/2014 - 12/25/2015 Chemotherapy    Maintenance Revlimid restarted, changed to 2.5mg daily, 2 weks on, 1 week off from 12/15/2014, stopped due to disease progression       01/24/2016 Progression    Patient is M protein has gradually increased to 1.0g, repeated a bone marrow biopsy showed plasma cell 10-30%      02/27/2016 -  Chemotherapy    Pomalidomide 4 mg daily on day 1-21, dexamethasone 40 mg on day 1, 8 and 15, every 28 days, started on 02/27/2016, daratumumab weekly started on 03/07/2016      03/10/2016 - 03/29/2016 Hospital Admission    Pt was admitted for sepsis from pneumonia, and severe pancytopenia. She required  ICU stay for a few days due to hypotension, developed b/l pleural effusion required diuretics and b/l thoracentesis. She also required blood and plt transfusion, and prolonged neupogen injection for severe neutropenia. She was discharged home after 3 weeks hospital stay       INTERVAL HISTORY:  Shannon Rush 75 y.o. female with a past medical history of MM as detailed above, s/p SCT is here for follow-up.   Shannon Rush continues to recover, her energy and activities are pretty much back to her normal level now. She still has moderate cough, no sputum production, no fever or chills. She is here to restart the tumor amputation. No other new complaints.     MEDICAL HISTORY: Past Medical History:  Diagnosis Date  . CAD (coronary artery disease)    a. inf STEMI (in [place] >>> [facility]) >>> LHC (8/15- [facility]):  pRCA 60%, mid to dist RCA 50% >>> med rx  . Cord compression (HCC) 07/13/12   MRI- diffuse myeloma involvement of T-L spine  . History of radiation therapy 07/13/12-07/27/12   spinal cord compression T3-T10,left scapula  . Hx of echocardiogram    Echo (03/02/14-done at [facility]):  Normal LVEF without significant regional wall motion abnormalities. Mild   . Multiple myeloma (HCC) 07/01/2012  . OSTEOPOROSIS 06/11/2010   Multiple compression fractures; and spontaneous fracture of sternum Qualifier: Diagnosis of  By: Saxon NP, Susan     . Thoracic kyphosis 07/13/12   per MRI scan  . Unspecified deficiency anemia      ALLERGIES:  is allergic to zosyn [piperacillin sod-tazobactam so]; other; piperacillin-tazobactam in dex; zithromax [azithromycin]; and ciprofloxacin.  MEDICATIONS: has a current medication list which includes the following prescription(s): acetaminophen, acyclovir, atorvastatin, calcium carbonate, clopidogrel, fluticasone, metoprolol succinate, nitroglycerin, ondansetron, proair hfa, and prochlorperazine, and the following  Facility-Administered Medications: sodium chloride, sodium chloride, sodium chloride, acetaminophen, acetaminophen, acetaminophen, daratumumab (DARZALEX) 900 mg in sodium chloride 0.9 % 455 mL (1.8 mg/mL) chemo infusion, diphenhydramine, furosemide, furosemide, heparin lock flush, heparin lock flush, heparin lock flush, heparin lock flush, methylprednisolone sodium succinate, montelukast, prochlorperazine, sodium chloride flush, sodium chloride flush, sodium chloride flush, sodium chloride flush, and sodium chloride flush.  SURGICAL HISTORY:  Past Surgical History:  Procedure Laterality Date  . APPENDECTOMY    . CESAREAN SECTION     x2   . COLONOSCOPY  2007   neg with Dr. Magod  . ELBOW SURGERY    . HIP SURGERY  2009   left  . TUBAL LIGATION      REVIEW OF SYSTEMS:   Constitutional: Denies abnormal weight loss, (+)   fatigue Eyes: Denies blurriness of vision Ears, nose, mouth, throat, and face: Denies mucositis or sore throat, (+) left tooth pain  Respiratory: Denies cough, dyspnea or wheezes Cardiovascular: Denies palpitation, chest discomfort or lower extremity swelling Gastrointestinal:  Denies nausea, heartburn or change in bowel habits Skin: Denies abnormal skin rashes Lymphatics: Denies new lymphadenopathy or easy bruising Neurological:Denies numbness, tingling or new weaknesses Behavioral/Psych: Mood is stable, no new changes  All other systems were reviewed with the patient and are negative.  PHYSICAL EXAMINATION: ECOG PERFORMANCE STATUS: 1 Blood pressure 118/68, pulse 88, temperature 97.8 F (36.6 C), temperature source Oral, resp. rate 17, height 5' 6" (1.676 m), weight 123 lb 1.6 oz (55.8 kg), SpO2 100 %. GENERAL:alert, no distress and comfortable; easily mobile to exam table; kyphosis, moderate.  SKIN: skin color, texture, turgor are normal, no rashes or significant lesions; Abrasion well healed on RLE.  EYES: normal, Conjunctiva are pink, 1 tear/injectionof blood vessel  left eye in sclera. OROPHARYNX:no exudate, no erythema and lips, buccal mucosa, and tongue normal, no ONJ NECK: supple, thyroid normal size, non-tender, without nodularity; nodules cervical bilaterally.  LYMPH:  no palpable lymphadenopathy in the cervical, axillary or supraclavicular LUNGS: crackles at the bases bilaterally with normal breathing effort, no wheezes or rhonchi HEART: regular rate & rhythm and no murmurs and no lower extremity edema ABDOMEN:abdomen soft, non-tender and normal bowel sounds Musculoskeletal:no cyanosis of digits and no clubbing, (+) spine scoliosis and mild tenderness  NEURO: alert & oriented x 3 with fluent speech, no focal motor/sensory deficits  Labs:  CBC Latest Ref Rng & Units 04/26/2016 04/17/2016 04/10/2016  WBC 3.9 - 10.3 10e3/uL 3.3(L) 2.4(L) 2.2(L)  Hemoglobin 11.6 - 15.9 g/dL 8.5(L) 8.0(L) 8.7(L)  Hematocrit 34.8 - 46.6 % 26.1(L) 24.5(L) 26.4(L)  Platelets 145 - 400 10e3/uL 141(L) 111(L) 114(L)    CMP Latest Ref Rng & Units 04/17/2016 04/10/2016 04/03/2016  Glucose 70 - 140 mg/dl 108 88 107  BUN 7.0 - 26.0 mg/dL 18.8 18.8 17.7  Creatinine 0.6 - 1.1 mg/dL 0.7 0.7 0.7  Sodium 136 - 145 mEq/L 140 140 139  Potassium 3.5 - 5.1 mEq/L 4.1 4.0 3.9  Chloride 101 - 111 mmol/L - - -  CO2 22 - 29 mEq/L 22 25 27  Calcium 8.4 - 10.4 mg/dL 8.9 8.9 8.5  Total Protein 6.4 - 8.3 g/dL 5.9(L) 6.0(L) 5.0(L)  Total Bilirubin 0.20 - 1.20 mg/dL 0.57 0.69 0.79  Alkaline Phos 40 - 150 U/L 88 107 112  AST 5 - 34 U/L 19 18 16  ALT 0 - 55 U/L 11 17 17   SPEP M-PROTEIN 10/01/2013: 0.18 (nadir)  10/13/2014: 0.6 11/21/2014: 0.5 01/30/2015: 0.6 03/13/2015: 0.6 04/20/2015: 0.5 06/01/2015: 0.5 07/20/2015: 0.7 09/14/2015: 0.7 10/26/2015: 0.8 12/07/2015: 1.0 02/07/2016: 1.1  04/03/2016: 0.2  IgG (690-1700) 10/13/2014: 1710 11/21/2014: 1460 01/30/2015: 1380 03/13/15: 1570 04/20/2015: 1550 06/01/2015: 1590 07/22/2015: 1770 09/14/2015: 1787 10/26/2015: 1898 12/07/2015: 2045 02/07/2016:  1883 04/03/2016: 787  Serum kappa, lambda light chains, and ratio  11/21/2014: 3.95, 2.08, 1.90  01/30/2015: 3.92, 2.64, 1.48 03/13/2015: 3.55, 2.17, 1.64 04/20/15: 4.52, 1.78, 2.54 06/01/2015: 4.82, 1.75, 2.75 07/20/2015: 8.71, 2.43, 3.58 09/14/2015: 9.17, 2.02, 4.55 10/26/2015: 11.4, 1.75, 6.42 12/07/2015: 1.54, 1.83, 8.44 02/07/2016: 1.93, 1.42, 13.6 04/03/2016: 0.96, 0.95, 1.01   24 hr urine PEP (total protein, M-spike) 10/13/2014: (-) 02/12/2016: 265mg, 68mg 04/05/2016: 114 mg, (-)  PATHOLOGY REPORT  Diagnosis 01/24/2016 Bone Marrow, Aspirate,Biopsy, and Clot, RT iliac and core - VARIABLY CELLULAR BONE MARROW WITH PLASMA CELL   NEOPLASM. - SEE COMMENT. PERIPHERAL BLOOD: - MACROCYTIC ANEMIA. Diagnosis Note The bone marrow is variably cellular with trilineage hematopoiesis and non specific myeloid changes. In this background, the plasma cell component is variable ranging from less than 10 % in many areas to 30% in some aspirate particles with relatively low cellularity. Foci of interstitial infiltrates and small clusters are also seen in the clot section. Immunohistochemical stains show kappa light chain restriction in atypical plasma cell clusters and infiltrates consistent with plasma cell neoplasm. Correlation with cytogenetic and FISH studies is recommended. (BNS:gt, 01/25/16)   RADIOGRAPHIC STUDIES: None  ASSESSMENT: Shannon Rush 74 y.o. female with a history of Multiple Myeloma s/p autotransplant in August 2014 now on maintenence low dose Revlimid therapy, and relapsed in 01/2016  PLAN:   1. IgG kappa Multiple myeloma s/p Auto SCT, intermediate risk, relapsed in 01/2016 --MM markers demonstrate a very good initial response (M protein baseline 2.6, down to 0.18 after induction chemo and transplant, stable around 0.5 since July 2015, but has gradually increased lately, most recent 1.0 on 12/07/2015) -I discussed her repeated bone marrow biopsy from 02/01/2016, which showed 10-30% plasma  cells in her marrow -I agree with Dr. Gasparetto's recommendation to stop revlimid and change to Daratumumab, Pomalidomide and dexamethasone due to her disease relapse. I don't think she has resistance to Revlimid, she has been on very small dose of Revlimid due to her prior history of multiple infections. -She started first cycle DaraPD in 02/2016, but in 2 weeks she developed severe pancytopenia, pneumonia with sepsis required 3 weeks hospital stay. She did have a very nice response to the treatment, her M-protein has significantly reduced from 1.2 to 0.2, light chain levels normalized even with half cycle treatment  -I recommended her to change regiment to DaraVD, and will start her on dara every 2 weeks for 2 doses, then change to full dose if she tolerate well, will start Velcade and dexamethasone until 4 weeks later -She previously tolerated Velcade very well, no significant neuropath. . -She has recovered very well, lab reviewed, cytopenia E resolved, or restart daratumumab today.  2. Pancytopenia  -secondary to chemo  -Nearly resolved and now except mild anemia  3. CHF, CAD, inferior STEMI in 02/2014 -continue plavix, metoprolol and lipitor  -follow up with Dr. Nishan  -her recent echo showed EF 45-50% -her b/l pleural effusion much improved after lasix and thoracentesis, she is off lasix now   4. Osteoporosis -recently repeated bone density scan showed worsening osteoporosis, continue zometa, changed to every 3 months after 03/2015.  -she declined repeating DEXA  -continue Zometa every 3 months  5. Weight loss, deconditioning  -secondary to recent peumonia and hospital stay, improving -Continue  outpt PT  -I encourage her to drink nutritional supplement    Plan -start daratumumab today, next dose in 2 weeks -Repeat lab in 1 week -I'll see her back in 2 weeks   All questions were answered. The patient knows to call the clinic with any problems, questions or concerns. We can  certainly see the patient much sooner if necessary.  I spent 20 minutes counseling the patient face to face. The total time spent in the appointment was 25 minutes.   Feng, Yan  04/26/2016     

## 2016-04-26 NOTE — Patient Instructions (Signed)
[facility] Discharge Instructions for Patients Receiving Chemotherapy  Today you received the following immunotherapy agents;  Daratumumab.    THESE ARE SYMPTOMS THAT SHOULD BE REPORTED IMMEDIATELY:  *FEVER GREATER THAN 100.5 F  *CHILLS WITH OR WITHOUT FEVER  NAUSEA AND VOMITING THAT IS NOT CONTROLLED WITH YOUR NAUSEA MEDICATION  *UNUSUAL SHORTNESS OF BREATH  *UNUSUAL BRUISING OR BLEEDING  TENDERNESS IN MOUTH AND THROAT WITH OR WITHOUT PRESENCE OF ULCERS  *URINARY PROBLEMS  *BOWEL PROBLEMS  UNUSUAL RASH Items with * indicate a potential emergency and should be followed up as soon as possible.  Feel free to call the clinic you have any questions or concerns. The clinic phone number is [phone].  Please show the [facility] at check-in to the Emergency Department and triage nurse.

## 2016-04-27 ENCOUNTER — Encounter: Payer: Self-pay | Admitting: Hematology

## 2016-05-02 ENCOUNTER — Other Ambulatory Visit: Payer: Self-pay | Admitting: *Deleted

## 2016-05-03 ENCOUNTER — Other Ambulatory Visit: Payer: Self-pay | Admitting: *Deleted

## 2016-05-03 ENCOUNTER — Other Ambulatory Visit: Payer: Self-pay | Admitting: Hematology

## 2016-05-03 ENCOUNTER — Other Ambulatory Visit (HOSPITAL_BASED_OUTPATIENT_CLINIC_OR_DEPARTMENT_OTHER): Payer: Medicare Other

## 2016-05-03 ENCOUNTER — Ambulatory Visit (HOSPITAL_BASED_OUTPATIENT_CLINIC_OR_DEPARTMENT_OTHER): Payer: Self-pay

## 2016-05-03 VITALS — BP 107/63 | HR 79 | Temp 98.0°F | Resp 17

## 2016-05-03 DIAGNOSIS — D61818 Other pancytopenia: Secondary | ICD-10-CM

## 2016-05-03 DIAGNOSIS — I252 Old myocardial infarction: Secondary | ICD-10-CM

## 2016-05-03 DIAGNOSIS — M810 Age-related osteoporosis without current pathological fracture: Secondary | ICD-10-CM | POA: Diagnosis not present

## 2016-05-03 DIAGNOSIS — C9000 Multiple myeloma not having achieved remission: Secondary | ICD-10-CM

## 2016-05-03 DIAGNOSIS — C9002 Multiple myeloma in relapse: Secondary | ICD-10-CM | POA: Diagnosis not present

## 2016-05-03 LAB — COMPREHENSIVE METABOLIC PANEL
ALT: 15 U/L (ref 0–55)
ANION GAP: 11 meq/L (ref 3–11)
AST: 19 U/L (ref 5–34)
Albumin: 3.6 g/dL (ref 3.5–5.0)
Alkaline Phosphatase: 95 U/L (ref 40–150)
BUN: 16.7 mg/dL (ref 7.0–26.0)
CALCIUM: 9.1 mg/dL (ref 8.4–10.4)
CHLORIDE: 108 meq/L (ref 98–109)
CO2: 21 mEq/L — ABNORMAL LOW (ref 22–29)
Creatinine: 0.7 mg/dL (ref 0.6–1.1)
EGFR: 84 mL/min/{1.73_m2} — ABNORMAL LOW (ref >90)
Glucose: 93 mg/dl (ref 70–140)
POTASSIUM: 3.8 meq/L (ref 3.5–5.1)
Sodium: 141 mEq/L (ref 136–145)
Total Bilirubin: 0.92 mg/dL (ref 0.20–1.20)
Total Protein: 6.6 g/dL (ref 6.4–8.3)

## 2016-05-03 LAB — CBC WITH DIFFERENTIAL/PLATELET
BASO%: 0.9 % (ref 0.0–2.0)
BASOS ABS: 0 10*3/uL (ref 0.0–0.1)
EOS%: 2.9 % (ref 0.0–7.0)
Eosinophils Absolute: 0.1 10*3/uL (ref 0.0–0.5)
HEMATOCRIT: 28.2 % — AB (ref 34.8–46.6)
HGB: 9.3 g/dL — ABNORMAL LOW (ref 11.6–15.9)
LYMPH#: 0.4 10*3/uL — AB (ref 0.9–3.3)
LYMPH%: 11.7 % — ABNORMAL LOW (ref 14.0–49.7)
MCH: 34.2 pg — AB (ref 25.1–34.0)
MCHC: 33 g/dL (ref 31.5–36.0)
MCV: 103.7 fL — ABNORMAL HIGH (ref 79.5–101.0)
MONO#: 0.3 10*3/uL (ref 0.1–0.9)
MONO%: 8.3 % (ref 0.0–14.0)
NEUT#: 2.7 10*3/uL (ref 1.5–6.5)
NEUT%: 76.2 % (ref 38.4–76.8)
PLATELETS: 160 10*3/uL (ref 145–400)
RBC: 2.72 10*6/uL — ABNORMAL LOW (ref 3.70–5.45)
RDW: 24.1 % — ABNORMAL HIGH (ref 11.2–14.5)
WBC: 3.5 10*3/uL — ABNORMAL LOW (ref 3.9–10.3)

## 2016-05-03 MED ORDER — ZOLEDRONIC ACID 4 MG/5ML IV CONC
3.5000 mg | Freq: Once | INTRAVENOUS | Status: AC
  Administered 2016-05-03: 3.5 mg via INTRAVENOUS
  Filled 2016-05-03: qty 4.38

## 2016-05-03 MED ORDER — SODIUM CHLORIDE 0.9 % IV SOLN
Freq: Once | INTRAVENOUS | Status: AC
  Administered 2016-05-03: 10:00:00 via INTRAVENOUS

## 2016-05-03 NOTE — Patient Instructions (Signed)

## 2016-05-03 NOTE — Addendum Note (Signed)
Addended by: [name] on: 05/03/2016 09:28 AM   Modules accepted: Orders

## 2016-05-06 LAB — KAPPA/LAMBDA LIGHT CHAINS
Ig Kappa Free Light Chain: 7.1 mg/L (ref 3.3–19.4)
Ig Lambda Free Light Chain: 3.4 mg/L — ABNORMAL LOW (ref 5.7–26.3)
KAPPA/LAMBDA FLC RATIO: 2.09 — AB (ref 0.26–1.65)

## 2016-05-08 LAB — MULTIPLE MYELOMA PANEL, SERUM
ALBUMIN SERPL ELPH-MCNC: 3.8 g/dL (ref 2.9–4.4)
ALBUMIN/GLOB SERPL: 1.7 (ref 0.7–1.7)
ALPHA 1: 0.3 g/dL (ref 0.0–0.4)
ALPHA2 GLOB SERPL ELPH-MCNC: 0.6 g/dL (ref 0.4–1.0)
B-GLOBULIN SERPL ELPH-MCNC: 0.8 g/dL (ref 0.7–1.3)
GAMMA GLOB SERPL ELPH-MCNC: 0.6 g/dL (ref 0.4–1.8)
GLOBULIN, TOTAL: 2.3 g/dL (ref 2.2–3.9)
IgA, Qn, Serum: 15 mg/dL — ABNORMAL LOW (ref 64–422)
IgG, Qn, Serum: 685 mg/dL — ABNORMAL LOW (ref 700–1600)
IgM, Qn, Serum: 13 mg/dL — ABNORMAL LOW (ref 26–217)
M PROTEIN SERPL ELPH-MCNC: 0.2 g/dL — AB
Total Protein: 6.1 g/dL (ref 6.0–8.5)

## 2016-05-10 ENCOUNTER — Ambulatory Visit (HOSPITAL_BASED_OUTPATIENT_CLINIC_OR_DEPARTMENT_OTHER): Payer: Medicare Other

## 2016-05-10 ENCOUNTER — Ambulatory Visit (HOSPITAL_BASED_OUTPATIENT_CLINIC_OR_DEPARTMENT_OTHER): Payer: Medicare Other | Admitting: Hematology

## 2016-05-10 ENCOUNTER — Other Ambulatory Visit (HOSPITAL_BASED_OUTPATIENT_CLINIC_OR_DEPARTMENT_OTHER): Payer: Medicare Other

## 2016-05-10 ENCOUNTER — Encounter: Payer: Self-pay | Admitting: Hematology

## 2016-05-10 VITALS — BP 103/57 | HR 71 | Temp 98.2°F | Resp 18

## 2016-05-10 VITALS — BP 122/83 | HR 81 | Temp 97.6°F | Resp 18 | Ht 66.0 in | Wt 120.3 lb

## 2016-05-10 DIAGNOSIS — C9002 Multiple myeloma in relapse: Secondary | ICD-10-CM

## 2016-05-10 DIAGNOSIS — Z5112 Encounter for antineoplastic immunotherapy: Secondary | ICD-10-CM

## 2016-05-10 DIAGNOSIS — I252 Old myocardial infarction: Secondary | ICD-10-CM | POA: Diagnosis not present

## 2016-05-10 DIAGNOSIS — C9000 Multiple myeloma not having achieved remission: Secondary | ICD-10-CM

## 2016-05-10 DIAGNOSIS — M818 Other osteoporosis without current pathological fracture: Secondary | ICD-10-CM

## 2016-05-10 DIAGNOSIS — I5022 Chronic systolic (congestive) heart failure: Secondary | ICD-10-CM

## 2016-05-10 LAB — COMPREHENSIVE METABOLIC PANEL
ALBUMIN: 3.9 g/dL (ref 3.5–5.0)
ALK PHOS: 91 U/L (ref 40–150)
ALT: 15 U/L (ref 0–55)
AST: 19 U/L (ref 5–34)
Anion Gap: 10 mEq/L (ref 3–11)
BILIRUBIN TOTAL: 0.83 mg/dL (ref 0.20–1.20)
BUN: 13.8 mg/dL (ref 7.0–26.0)
CO2: 21 mEq/L — ABNORMAL LOW (ref 22–29)
Calcium: 8.9 mg/dL (ref 8.4–10.4)
Chloride: 108 mEq/L (ref 98–109)
Creatinine: 0.7 mg/dL (ref 0.6–1.1)
EGFR: 84 mL/min/{1.73_m2} — ABNORMAL LOW (ref >90)
GLUCOSE: 108 mg/dL (ref 70–140)
POTASSIUM: 3.7 meq/L (ref 3.5–5.1)
SODIUM: 139 meq/L (ref 136–145)
TOTAL PROTEIN: 6.7 g/dL (ref 6.4–8.3)

## 2016-05-10 LAB — CBC WITH DIFFERENTIAL/PLATELET
BASO%: 1.3 % (ref 0.0–2.0)
Basophils Absolute: 0.1 10*3/uL (ref 0.0–0.1)
EOS%: 2.8 % (ref 0.0–7.0)
Eosinophils Absolute: 0.1 10*3/uL (ref 0.0–0.5)
HCT: 32.7 % — ABNORMAL LOW (ref 34.8–46.6)
HEMOGLOBIN: 10.7 g/dL — AB (ref 11.6–15.9)
LYMPH#: 0.6 10*3/uL — AB (ref 0.9–3.3)
LYMPH%: 14.2 % (ref 14.0–49.7)
MCH: 34.7 pg — ABNORMAL HIGH (ref 25.1–34.0)
MCHC: 32.7 g/dL (ref 31.5–36.0)
MCV: 106.2 fL — ABNORMAL HIGH (ref 79.5–101.0)
MONO#: 0.3 10*3/uL (ref 0.1–0.9)
MONO%: 7.8 % (ref 0.0–14.0)
NEUT%: 73.9 % (ref 38.4–76.8)
NEUTROS ABS: 2.9 10*3/uL (ref 1.5–6.5)
NRBC: 0 % (ref 0–0)
Platelets: 174 10*3/uL (ref 145–400)
RBC: 3.08 10*6/uL — ABNORMAL LOW (ref 3.70–5.45)
RDW: 23.5 % — AB (ref 11.2–14.5)
WBC: 4 10*3/uL (ref 3.9–10.3)

## 2016-05-10 MED ORDER — PROCHLORPERAZINE MALEATE 10 MG PO TABS
10.0000 mg | ORAL_TABLET | Freq: Once | ORAL | Status: AC
  Administered 2016-05-10: 10 mg via ORAL

## 2016-05-10 MED ORDER — SODIUM CHLORIDE 0.9 % IV SOLN
16.0000 mg/kg | Freq: Once | INTRAVENOUS | Status: AC
  Administered 2016-05-10: 900 mg via INTRAVENOUS
  Filled 2016-05-10: qty 40

## 2016-05-10 MED ORDER — DIPHENHYDRAMINE HCL 25 MG PO CAPS
50.0000 mg | ORAL_CAPSULE | Freq: Once | ORAL | Status: AC
  Administered 2016-05-10: 50 mg via ORAL

## 2016-05-10 MED ORDER — METHYLPREDNISOLONE SODIUM SUCC 125 MG IJ SOLR
125.0000 mg | Freq: Once | INTRAMUSCULAR | Status: AC
  Administered 2016-05-10: 125 mg via INTRAVENOUS

## 2016-05-10 MED ORDER — SODIUM CHLORIDE 0.9 % IV SOLN
Freq: Once | INTRAVENOUS | Status: AC
  Administered 2016-05-10: 10:00:00 via INTRAVENOUS

## 2016-05-10 MED ORDER — PROCHLORPERAZINE MALEATE 10 MG PO TABS
ORAL_TABLET | ORAL | Status: AC
  Filled 2016-05-10: qty 1

## 2016-05-10 MED ORDER — MONTELUKAST SODIUM 10 MG PO TABS
10.0000 mg | ORAL_TABLET | Freq: Once | ORAL | Status: AC
  Administered 2016-05-10: 10 mg via ORAL
  Filled 2016-05-10: qty 1

## 2016-05-10 MED ORDER — METHYLPREDNISOLONE SODIUM SUCC 125 MG IJ SOLR
INTRAMUSCULAR | Status: AC
  Filled 2016-05-10: qty 2

## 2016-05-10 MED ORDER — ACETAMINOPHEN 325 MG PO TABS
650.0000 mg | ORAL_TABLET | Freq: Once | ORAL | Status: AC
  Administered 2016-05-10: 650 mg via ORAL

## 2016-05-10 MED ORDER — ACETAMINOPHEN 325 MG PO TABS
ORAL_TABLET | ORAL | Status: AC
  Filled 2016-05-10: qty 2

## 2016-05-10 MED ORDER — DIPHENHYDRAMINE HCL 25 MG PO CAPS
ORAL_CAPSULE | ORAL | Status: AC
  Filled 2016-05-10: qty 2

## 2016-05-10 NOTE — Patient Instructions (Signed)
[facility] Discharge Instructions for Patients Receiving Chemotherapy  Today you received the following immunotherapy agents;  Daratumumab.    THESE ARE SYMPTOMS THAT SHOULD BE REPORTED IMMEDIATELY:  *FEVER GREATER THAN 100.5 F  *CHILLS WITH OR WITHOUT FEVER  NAUSEA AND VOMITING THAT IS NOT CONTROLLED WITH YOUR NAUSEA MEDICATION  *UNUSUAL SHORTNESS OF BREATH  *UNUSUAL BRUISING OR BLEEDING  TENDERNESS IN MOUTH AND THROAT WITH OR WITHOUT PRESENCE OF ULCERS  *URINARY PROBLEMS  *BOWEL PROBLEMS  UNUSUAL RASH Items with * indicate a potential emergency and should be followed up as soon as possible.  Feel free to call the clinic you have any questions or concerns. The clinic phone number is [phone].  Please show the [facility] at check-in to the Emergency Department and triage nurse.

## 2016-05-10 NOTE — Progress Notes (Signed)
[facility] ONCOLOGY OFFICE PROGRESS NOTE   Shannon Rush,Shannon ARTHUR, MD [address] [place] [address]  DIAGNOSIS: Multiple myeloma with relapse  CHIEF COMPLAINS: Follow-up of multiple myeloma  CURRENT TREATMENT:   1. Pomalidomide 4 mg daily on day 1-21, dexamethasone 40 mg on day 1, 8 and 15, every 28 days, started on 02/27/2016, daratumumab weekly started on 03/07/2016, treatment on hold since 03/09/2016 due to hospitalization and severe cytopenia 2. Daratumumab restarted on 04/26/2016, Velcade 1.3mg/m2 and dexa 20mg weekly start on 11/3 2. Zometa 3.5 mg monthly started on 06/2013, on every 3 months now, last 05/03/2016   Oncology History   Multiple myeloma (HCC)   Staging form: Multiple Myeloma, AJCC 6th Edition   - Clinical: Stage IIIA - Signed by David Chism, MD on 09/04/2013      Multiple myeloma (HCC)   05/26/2012 Initial Diagnosis    Presenting IgG was 4,040 mg/dL on 06/05/2012 (IgA 35; IgM 34); kappa free light chain 34.4 mg/dL, lambda 0.00, kappa:lambda ratio of 34.75; SPEP with M-spike of 2.60.      06/30/2012 Imaging    Numerous lytic lesions throughout the calvarium.  Lytic lesion within the left lateral scapula.  Findings most compatible with metastases or myeloma. Multiple mid and lower thoracic compression fractures.  Slight compression through the endplates at L3.      07/03/2012 Bone Marrow Biopsy    Bone marrow biopsy showed 49% plasma cell. Normal classical cytogenetics; however, myeloma FISH panel showed 13q- (intermediate risk)          07/14/2012 - 07/27/2012 Radiation Therapy    Palliative radiation 20 Gy over 10 fractions between to thoracic spine cord compression and symptomatic left scapula lytic lesion.      08/10/2012 -  Chemotherapy    Started SQ Velcade once weekly, 3 weeks on, 1 weeks off; daily Revlimid d1-21, 7 days off; and Dexamethasone 40mg PO weekly.       02/12/2013 Bone Marrow Transplant    Auto bone marrow  transplant at [facility]        06/14/2013 Tumor Marker    IgG  1830,  Kappa:lamba ratio, 1.69 (baseline was 34.75 or   M-spike 0.28 (baseline of 2.6 or  89.3% of baseline).        06/25/2013 -  Chemotherapy    Started zometa 3.5 mg monthly       09/01/2013 -  Chemotherapy    Maintenance therapy with revlimid 5mg daily for 14 days and then off for 7 (decreased from 21 days on and 7 days off based on neutropenia).       09/13/2013 - 09/17/2014 Hospital Admission    Hospital admission for pneumonia      12/20/2013 Treatment Plan Change    Maintenance therapy decreased to 2.5 mg daily for 21 days and then off for 7 days based on low counts.       01/25/2014 Tumor Marker    IgG 1360 M spike 0.5      03/01/2014 - 03/05/2014 Hospital Admission    [facility] with an inferior STEMI, received thrombolytic therapy, cath showed 60% prox RCA, mid-distal RCA 50%, Echo normal, no stents.      04/11/2014 - 08/07/2014 Chemotherapy    Continue Revlimid at 2.5 mg 3 weeks on 1 week off      08/08/2014 - 08/13/2014 Hospital Admission    Hospital admission for pneumonia. Her Revlimid was held.      08/29/2014 - 09/14/2014 Chemotherapy      Maintenance Revlimid restarted      09/14/2014 - 09/17/2014 Hospital Admission    Admitted for pneumonia after coming back from a trip in [name]. Revlimid was held again.      11/13/2014 - 12/25/2015 Chemotherapy    Maintenance Revlimid restarted, changed to 2.'5mg'$  daily, 2 weks on, 1 week off from 12/15/2014, stopped due to disease progression       01/24/2016 Progression    Patient is M protein has gradually increased to 1.0g, repeated a bone marrow biopsy showed plasma cell 10-30%      02/27/2016 -  Chemotherapy    Pomalidomide 4 mg daily on day 1-21, dexamethasone 40 mg on day 1, 8 and 15, every 28 days, started on 02/27/2016, daratumumab weekly started on 03/07/2016, held after first dose, due to hospitalization and a severe pancytopenia       03/10/2016 - 03/29/2016 Hospital Admission    Pt was admitted for sepsis from pneumonia, and severe pancytopenia. She required ICU stay for a few days due to hypotension, developed b/l pleural effusion required diuretics and b/l thoracentesis. She also required blood and plt transfusion, and prolonged neupogen injection for severe neutropenia. She was discharged home after 3 weeks hospital stay       04/26/2016 -  Chemotherapy    Daratumumab restarted on 04/26/2016, Velcade 1.'3mg'$ /m2 and dexa '20mg'$  weekly start on 11/3      INTERVAL HISTORY:  Shannon Rush 75 y.o. female with a past medical history of MM as detailed above, s/p SCT is here for follow-up.   Shannon Rush Continues to cough, her energy and activity level up pretty much back to 2-3 months ago before we changed her chemotherapy. She has good appetite, eating well, and remains to be physically active. Occasional dry cough in the morning, no dyspnea or chest pain. No other new complaints.  MEDICAL HISTORY: Past Medical History:  Diagnosis Date  . CAD (coronary artery disease)    a. inf STEMI (in [place] >>> [facility]) >>> LHC (8/15- [place]):  pRCA 60%, mid to dist RCA 50% >>> med rx  . Cord compression ([place]) 07/13/12   MRI- diffuse myeloma involvement of T-L spine  . History of radiation therapy 07/13/12-07/27/12   spinal cord compression T3-T10,left scapula  . Hx of echocardiogram    Echo (03/02/14-[facility] at [facility]):  Normal LVEF without significant regional wall motion abnormalities. Mild   . Multiple myeloma ([place]) 07/01/2012  . OSTEOPOROSIS 06/11/2010   Multiple compression fractures; and spontaneous fracture of sternum Qualifier: Diagnosis of  By: [name], [name]     . Thoracic kyphosis 07/13/12   per MRI scan  . Unspecified deficiency anemia      ALLERGIES:  is allergic to zosyn [piperacillin sod-tazobactam so]; other; piperacillin-tazobactam in dex; zithromax [azithromycin];  and ciprofloxacin.  MEDICATIONS: has a current medication list which includes the following prescription(s): acetaminophen, acyclovir, atorvastatin, calcium carbonate, clopidogrel, metoprolol succinate, nitroglycerin, ondansetron, proair hfa, prochlorperazine, and fluticasone, and the following Facility-Administered Medications: sodium chloride, sodium chloride, acetaminophen, acetaminophen, furosemide, furosemide, heparin lock flush, heparin lock flush, heparin lock flush, heparin lock flush, sodium chloride flush, sodium chloride flush, sodium chloride flush, sodium chloride flush, and sodium chloride flush.  SURGICAL HISTORY:  Past Surgical History:  Procedure Laterality Date  . APPENDECTOMY    . CESAREAN SECTION     x2   . COLONOSCOPY  2007   neg with Dr. [name]  . ELBOW SURGERY    . HIP  SURGERY  2009   left  . TUBAL LIGATION      REVIEW OF SYSTEMS:   Constitutional: Denies abnormal weight loss, (+) fatigue Eyes: Denies blurriness of vision Ears, nose, mouth, throat, and face: Denies mucositis or sore throat, (+) left tooth pain  Respiratory: Denies cough, dyspnea or wheezes Cardiovascular: Denies palpitation, chest discomfort or lower extremity swelling Gastrointestinal:  Denies nausea, heartburn or change in bowel habits Skin: Denies abnormal skin rashes Lymphatics: Denies new lymphadenopathy or easy bruising Neurological:Denies numbness, tingling or new weaknesses Behavioral/Psych: Mood is stable, no new changes  All other systems were reviewed with the patient and are negative.  PHYSICAL EXAMINATION: ECOG PERFORMANCE STATUS: 1 Blood pressure 122/83, pulse 81, temperature 97.6 F (36.4 C), temperature source Oral, resp. rate 18, height '5\' 6"'$  (1.676 m), weight 120 lb 4.8 oz (54.6 kg), SpO2 100 %. GENERAL:alert, no distress and comfortable; easily mobile to exam table; kyphosis, moderate.  SKIN: skin color, texture, turgor are normal, no rashes or significant lesions; Abrasion  well healed on RLE.  EYES: normal, Conjunctiva are pink, 1 tear/injectionof blood vessel left eye in sclera. OROPHARYNX:no exudate, no erythema and lips, buccal mucosa, and tongue normal, no ONJ NECK: supple, thyroid normal size, non-tender, without nodularity; nodules cervical bilaterally.  LYMPH:  no palpable lymphadenopathy in the cervical, axillary or supraclavicular LUNGS: crackles at the bases bilaterally with normal breathing effort, no wheezes or rhonchi HEART: regular rate & rhythm and no murmurs and no lower extremity edema ABDOMEN:abdomen soft, non-tender and normal bowel sounds Musculoskeletal:no cyanosis of digits and no clubbing, (+) spine scoliosis and mild tenderness  NEURO: alert & oriented x 3 with fluent speech, no focal motor/sensory deficits  Labs:  CBC Latest Ref Rng & Units 05/10/2016 05/03/2016 04/26/2016  WBC 3.9 - 10.3 10e3/uL 4.0 3.5(L) 3.3(L)  Hemoglobin 11.6 - 15.9 g/dL 10.7(L) 9.3(L) 8.5(L)  Hematocrit 34.8 - 46.6 % 32.7(L) 28.2(L) 26.1(L)  Platelets 145 - 400 10e3/uL 174 160 141(L)    CMP Latest Ref Rng & Units 05/10/2016 05/03/2016 05/03/2016  Glucose 70 - 140 mg/dl 108 93 -  BUN 7.0 - 26.0 mg/dL 13.8 16.7 -  Creatinine 0.6 - 1.1 mg/dL 0.7 0.7 -  Sodium 136 - 145 mEq/L 139 141 -  Potassium 3.5 - 5.1 mEq/L 3.7 3.8 -  Chloride 101 - 111 mmol/L - - -  CO2 22 - 29 mEq/L 21(L) 21(L) -  Calcium 8.4 - 10.4 mg/dL 8.9 9.1 -  Total Protein 6.4 - 8.3 g/dL 6.7 6.6 6.1  Total Bilirubin 0.20 - 1.20 mg/dL 0.83 0.92 -  Alkaline Phos 40 - 150 U/L 91 95 -  AST 5 - 34 U/L 19 19 -  ALT 0 - 55 U/L 15 15 -   SPEP M-PROTEIN 10/01/2013: 0.18 (nadir)  10/13/2014: 0.6 11/21/2014: 0.5 01/30/2015: 0.6 03/13/2015: 0.6 04/20/2015: 0.5 06/01/2015: 0.5 07/20/2015: 0.7 09/14/2015: 0.7 10/26/2015: 0.8 12/07/2015: 1.0 02/07/2016: 1.1  04/03/2016: 0.2 05/03/2016: 0.2  IgG (334 663 1740) 10/13/2014: 1710 11/21/2014: 1460 01/30/2015: 1380 03/13/15: 1570 04/20/2015: 1550 06/01/2015:  1590 07/22/2015: 1770 09/14/2015: 1787 10/26/2015: 1898 12/07/2015: 2045 02/07/2016: 1883 04/03/2016: 787 05/03/2016: 685  Serum kappa, lambda light chains, and ratio  11/21/2014: 3.95, 2.08, 1.90  01/30/2015: 3.92, 2.64, 1.48 03/13/2015: 3.55, 2.17, 1.64 04/20/15: 4.52, 1.78, 2.54 06/01/2015: 4.82, 1.75, 2.75 07/20/2015: 8.71, 2.43, 3.58 09/14/2015: 9.17, 2.02, 4.55 10/26/2015: 11.4, 1.75, 6.42 12/07/2015: 1.54, 1.83, 8.44 02/07/2016: 1.93, 1.42, 13.6 04/03/2016: 0.96, 0.95, 1.01  05/03/2016: 0.71, 0.34, 2.09  24 hr urine PEP (total  protein, M-spike) 10/13/2014: (-) 02/12/2016: '265mg'$ , '68mg'$  04/05/2016: 114 mg, (-)  PATHOLOGY REPORT  Diagnosis 01/24/2016 Bone Marrow, Aspirate,Biopsy, and Clot, RT iliac and core - VARIABLY CELLULAR BONE MARROW WITH PLASMA CELL NEOPLASM. - SEE COMMENT. PERIPHERAL BLOOD: - MACROCYTIC ANEMIA. Diagnosis Note The bone marrow is variably cellular with trilineage hematopoiesis and non specific myeloid changes. In this background, the plasma cell component is variable ranging from less than 10 % in many areas to 30% in some aspirate particles with relatively low cellularity. Foci of interstitial infiltrates and small clusters are also seen in the clot section. Immunohistochemical stains show kappa light chain restriction in atypical plasma cell clusters and infiltrates consistent with plasma cell neoplasm. Correlation with cytogenetic and FISH studies is recommended. (BNS:gt, 2016-02-04)   RADIOGRAPHIC STUDIES: None  ASSESSMENT: Shannon Rush 75 y.o. female with a history of Multiple Myeloma s/p autotransplant in August 2014 followed by maintenence low dose Revlimid therapy, and relapsed in 01/2016  PLAN:   1. IgG kappa Multiple myeloma s/p Auto SCT, intermediate risk, relapsed in 01/2016 --MM markers demonstrate a very good initial response (M protein baseline 2.6, down to 0.18 after induction chemo and transplant, stable around 0.5 since July 2015, but has gradually  increased lately, most recent 1.0 on 12/07/2015) -I discussed her repeated bone marrow biopsy from 02/01/2016, which showed 10-30% plasma cells in her marrow -I agree with Dr. Kendell Bane recommendation to stop revlimid and change to Daratumumab, Pomalidomide and dexamethasone due to her disease relapse. I don't think she has resistance to Revlimid, she has been on very small dose of Revlimid due to her prior history of multiple infections. -She started first cycle DaraPD in 02/2016, but in 2 weeks she developed severe pancytopenia, pneumonia with sepsis required 3 weeks hospital stay. She did have a very nice response to the treatment, her M-protein has significantly reduced from 1.2 to 0.2, light chain levels normalized even with half cycle treatment  -I recommended her to change regiment to DaraVD, and started her on dara every 2 weeks for 2 doses, she has been tolerating very well, pancytopenia has much improved, she will change to weekly full dose in 2 weeksl, will start weekly Velcade and dexamethasone next week  -She previously tolerated Velcade very well, no significant neuropath. . -She has recovered very well, lab reviewed, neutropenia and thrombocytopenia has resolved, her anemia also much improved.  2. Pancytopenia  -secondary to chemo  -Nearly resolved and now except mild anemia  3. CHF, CAD, inferior STEMI in 02/2014 -continue plavix, metoprolol and lipitor  -follow up with Dr. Johnsie Cancel  -her recent echo showed EF 45-50% -her b/l pleural effusion much improved after lasix and thoracentesis, she is off lasix now   4. Osteoporosis -recently repeated bone density scan showed worsening osteoporosis, continue zometa, changed to every 3 months after 03/2015.  -she declined repeating DEXA  -continue Zometa every 3 months    Plan -continue daratumumab, treatment today, next dose in 2 weeks -She will start weekly Velcade and dexamethasone 20 mg in one week -I will see her back in 2  weeks -she will have the week of Thanksgiving off per her request    All questions were answered. The patient knows to call the clinic with any problems, questions or concerns. We can certainly see the patient much sooner if necessary.  I spent 20 minutes counseling the patient face to face. The total time spent in the appointment was 25 minutes.   [name]  05/10/2016

## 2016-05-11 ENCOUNTER — Telehealth: Payer: Self-pay | Admitting: Hematology

## 2016-05-11 LAB — ACID FAST CULTURE WITH REFLEXED SENSITIVITIES (MYCOBACTERIA): Acid Fast Culture: NEGATIVE

## 2016-05-11 NOTE — Telephone Encounter (Signed)
Spoke with patient re next appointment for 11/3 and patient will get schedule 11/3. Per 10/27 los cx dara next Friday (11/3) and add velcade injection.

## 2016-05-14 NOTE — Progress Notes (Signed)
Forgan, MD [place] [address]  DIAGNOSIS: Multiple myeloma with relapse  CHIEF COMPLAINS: Follow-up of multiple myeloma  CURRENT TREATMENT:   1. Pomalidomide 4 mg daily on day 1-21, dexamethasone 40 mg on day 1, 8 and 15, every 28 days, started on 02/27/2016, daratumumab weekly started on 03/07/2016, treatment on hold since 03/09/2016 due to hospitalization and severe cytopenia 2. Daratumumab restarted on 04/26/2016, Velcade 1.58m/m2 and dexa 226mweekly start on 11/3 2. Zometa 3.5 mg monthly started on 06/2013, on every 3 months now, last 05/03/2016   Oncology History   [facility]  Staging form: Multiple Myeloma, AJCC 6th Edition   - Clinical: Stage IIIA - Signed by [name] on 09/04/2013      [place] ([facility]  05/26/2012 Initial Diagnosis    Presenting IgG was 4,040 mg/dL on 06/05/2012 (IgA 35; IgM 34); kappa free light chain 34.4 mg/dL, lambda 0.00, kappa:lambda ratio of 34.75; SPEP with M-spike of 2.60.      06/30/2012 Imaging    Numerous lytic lesions throughout the calvarium.  Lytic lesion within the left lateral scapula.  Findings most compatible with metastases or myeloma. Multiple mid and lower thoracic compression fractures.  Slight compression through the endplates at L3.      1227/51/7001one Marrow Biopsy    Bone marrow biopsy showed 49% plasma cell. Normal classical cytogenetics; however, myeloma FISH panel showed 13q- (intermediate risk)          07/14/2012 - 07/27/2012 Radiation Therapy    Palliative radiation 20 Gy over 10 fractions between to thoracic spine cord compression and symptomatic left scapula lytic lesion.      08/10/2012 -  Chemotherapy    Started SQ Velcade once weekly, 3 weeks on, 1 weeks off; daily Revlimid d1-21, 7 days off; and Dexamethasone 4079mO weekly.       02/12/2013 Bone Marrow Transplant    [facility]     06/14/2013 Tumor Marker    IgG  1830,  Kappa:lamba ratio, 1.69 (baseline was 34.75 or   M-spike 0.28 (baseline of 2.6 or  89.3% of baseline).        06/25/2013 -  Chemotherapy    Started zometa 3.5 mg monthly       09/01/2013 -  Chemotherapy    Maintenance therapy with revlimid 5mg69mily for 14 days and then off for 7 (decreased from 21 days on and 7 days off based on neutropenia).       09/13/2013 - 09/17/2014 Hospital Admission    Hospital admission for pneumonia      12/20/2013 Treatment Plan Change    Maintenance therapy decreased to 2.5 mg daily for 21 days and then off for 7 days based on low counts.       01/25/2014 Tumor Marker    IgG 1360 M spike 0.5      03/01/2014 - 03/05/2014 [facility] thromb[facility], cath showed 60% prox RCA, mid-distal RCA 50%, Echo normal, no stents.      04/11/2014 - 08/07/2014 Chemotherapy    Continue Revlimid at 2.5 mg 3 weeks on 1 week off      08/08/2014 - 08/13/2014 Hospital Admission    Hospital admission for pneumonia. Her Revlimid was held.      08/29/2014 - 09/14/2014 Chemotherapy  Maintenance Revlimid restarted      09/14/2014 - 09/17/2014 Hospital Admission    Admitted for pneumonia after coming back from a trip in [name]. Revlimid was held again.      11/13/2014 - 12/25/2015 Chemotherapy    Maintenance Revlimid restarted, changed to 2.9m daily, 2 weks on, 1 week off from 12/15/2014, stopped due to disease progression       01/24/2016 Progression    Patient is M protein has gradually increased to 1.0g, repeated a bone marrow biopsy showed plasma cell 10-30%      02/27/2016 -  Chemotherapy    Pomalidomide 4 mg daily on day 1-21, dexamethasone 40 mg on day 1, 8 and 15, every 28 days, started on 02/27/2016, daratumumab weekly started on 03/07/2016, held after first dose, due to hospitalization and a severe pancytopenia       03/10/2016 - 03/29/2016 Hospital Admission    Pt was admitted for sepsis from pneumonia, and severe pancytopenia. She required ICU stay for a few days due to hypotension, developed b/l pleural effusion required diuretics and b/l thoracentesis. She also required blood and plt transfusion, and prolonged neupogen injection for severe neutropenia. She was discharged home after 3 weeks hospital stay       04/26/2016 -  Chemotherapy    Daratumumab restarted on 04/26/2016, Velcade 1.341mm2 and dexa 2048meekly start on 11/3      INTERVAL HISTORY:  ClaGRACLYNN VANANTWERP 75o. female with a past medical history of MM as detailed above, s/p SCT is here for follow-up.   ClaKiarah doing well overall. She did well with her most recent treatment. She took her dexamethasone medication during our visit since she forgot to do so this morning. She is eating well. Her energy level is good. She reports a mild productive cough.  MEDICAL HISTORY: Past Medical History:  Diagnosis Date  . CAD (coronary artery disease)    [place] pRCA 60%, mid to dist RCA 50% >>> med rx  . [place]   MRI- diffuse myeloma involvement of T-L spine  . History of radiation therapy 07/13/12-07/27/12   spinal cord compression T3-T10,left scapula  . Hx of echocardiogram    [facility] Normal LVEF without significant regional wall motion abnormalities. Mild   . Multiple [place]oma ([place]  . OSTEOPOROSIS 06/11/2010   Multiple compression fractures; and spontaneous fracture of sternum Qualifier: Diagnosis of  By: SaxZebedee Iba, SusManuela Schwartz  . Thoracic kyphosis 07/13/12   per MRI scan  . Unspecified deficiency anemia      ALLERGIES:  is allergic to zosyn [piperacillin sod-tazobactam so]; other; piperacillin-tazobactam in dex; zithromax [azithromycin]; and ciprofloxacin.  MEDICATIONS: has a  current medication list which includes the following prescription(s): acetaminophen, acyclovir, atorvastatin, calcium carbonate, clopidogrel, fluticasone, metoprolol succinate, nitroglycerin, ondansetron, proair hfa, prochlorperazine, and dexamethasone, and the following Facility-Administered Medications: sodium chloride, sodium chloride, acetaminophen, acetaminophen, furosemide, furosemide, heparin lock flush, heparin lock flush, heparin lock flush, heparin lock flush, sodium chloride flush, sodium chloride flush, sodium chloride flush, sodium chloride flush, and sodium chloride flush.  SURGICAL HISTORY:  Past Surgical History:  Procedure Laterality Date  . APPENDECTOMY    . CESAREAN SECTION     x2   . COLONOSCOPY  2007   neg with Dr. MagWatt Climes ELBOW SURGERY    . HIP SURGERY  2009  left  . TUBAL LIGATION      REVIEW OF SYSTEMS:   Constitutional: Denies abnormal weight loss Eyes: Denies blurriness of vision Ears, nose, mouth, throat, and face: Denies mucositis or sore throat,  Respiratory: Denies dyspnea or wheezes (+) mild productive cough Cardiovascular: Denies palpitation, chest discomfort (+) mild lower extremity swelling Gastrointestinal:  Denies nausea, heartburn or change in bowel habits Skin: Denies abnormal skin rashes Lymphatics: Denies new lymphadenopathy or easy bruising Neurological:Denies numbness, tingling or new weaknesses Behavioral/Psych: Mood is stable, no new changes  All other systems were reviewed with the patient and are negative.   PHYSICAL EXAMINATION: ECOG PERFORMANCE STATUS: 1 Blood pressure (!) 107/57, pulse 72, temperature 97.8 F (36.6 C), temperature source Oral, resp. rate 17, height 5' 6" (1.676 m), weight 120 lb (54.4 kg), SpO2 100 %. GENERAL:alert, no distress and comfortable; easily mobile to exam table; kyphosis, moderate.  SKIN: skin color, texture, turgor are normal, no rashes or significant lesions; Abrasion well healed on RLE.  EYES: normal,  Conjunctiva are pink, 1 tear/injectionof blood vessel left eye in sclera. OROPHARYNX:no exudate, no erythema and lips, buccal mucosa, and tongue normal, no ONJ NECK: supple, thyroid normal size, non-tender, without nodularity; nodules cervical bilaterally.  LYMPH:  no palpable lymphadenopathy in the cervical, axillary or supraclavicular LUNGS: crackles at the bases bilaterally with normal breathing effort, no wheezes or rhonchi HEART: regular rate & rhythm and no murmurs (+) trace lower extremity edema ABDOMEN:abdomen soft, non-tender and normal bowel sounds Musculoskeletal:no cyanosis of digits and no clubbing, (+) spine scoliosis and mild tenderness  NEURO: alert & oriented x 3 with fluent speech, no focal motor/sensory deficits   Labs:  CBC Latest Ref Rng & Units 05/17/2016 05/10/2016 05/03/2016  WBC 3.9 - 10.3 10e3/uL 3.0(L) 4.0 3.5(L)  Hemoglobin 11.6 - 15.9 g/dL 9.9(L) 10.7(L) 9.3(L)  Hematocrit 34.8 - 46.6 % 29.9(L) 32.7(L) 28.2(L)  Platelets 145 - 400 10e3/uL 152 174 160    CMP Latest Ref Rng & Units 05/17/2016 05/10/2016 05/03/2016  Glucose 70 - 140 mg/dl 68(L) 108 93  BUN 7.0 - 26.0 mg/dL 15.3 13.8 16.7  Creatinine 0.6 - 1.1 mg/dL 0.7 0.7 0.7  Sodium 136 - 145 mEq/L 141 139 141  Potassium 3.5 - 5.1 mEq/L 3.6 3.7 3.8  Chloride 101 - 111 mmol/L - - -  CO2 22 - 29 mEq/L 25 21(L) 21(L)  Calcium 8.4 - 10.4 mg/dL 8.6 8.9 9.1  Total Protein 6.4 - 8.3 g/dL 6.1(L) 6.7 6.6  Total Bilirubin 0.20 - 1.20 mg/dL 0.85 0.83 0.92  Alkaline Phos 40 - 150 U/L 77 91 95  AST 5 - 34 U/L _0 ALT 0 - 55 U/L _1 SPEP M-PROTEIN 10/01/2013: 0.18 (nadir)  10/13/2014: 0.6 11/21/2014: 0.5 01/30/2015: 0.6 03/13/2015: 0.6 04/20/2015: 0.5 06/01/2015: 0.5 07/20/2015: 0.7 09/14/2015: 0.7 10/26/2015: 0.8 12/07/2015: 1.0 02/07/2016: 1.1  04/03/2016: 0.2 05/03/2016: 0.2   IgG [phone]) 10/13/2014: 1710 11/21/2014: 1460 01/30/2015: 1380 03/13/15: 1570 04/20/2015: 1550 06/01/2015: 1590 07/22/2015:  1770 09/14/2015: 1787 10/26/2015: 1898 12/07/2015: 2045 02/07/2016: 1883 04/03/2016: 787 05/03/2016: 685   Serum kappa, lambda light chains, and ratio  11/21/2014: 3.95, 2.08, 1.90  01/30/2015: 3.92, 2.64, 1.48 03/13/2015: 3.55, 2.17, 1.64 04/20/15: 4.52, 1.78, 2.54 06/01/2015: 4.82, 1.75, 2.75 07/20/2015: 8.71, 2.43, 3.58 09/14/2015: 9.17, 2.02, 4.55 10/26/2015: 11.4, 1.75, 6.42 12/07/2015: 1.54, 1.83, 8.44 02/07/2016: 1.93, 1.42, 13.6 04/03/2016: 0.96, 0.95, 1.01  05/03/2016: 0.71, 0.34, 2.09   24 hr urine PEP (total protein, M-spike) 10/13/2014: (-)  02/12/2016: 265mg, 68mg 04/05/2016: 114 mg, (-)  PATHOLOGY REPORT  Diagnosis 01/24/2016 Bone Marrow, Aspirate,Biopsy, and Clot, RT iliac and core - VARIABLY CELLULAR BONE MARROW WITH PLASMA CELL NEOPLASM. - SEE COMMENT. PERIPHERAL BLOOD: - MACROCYTIC ANEMIA. Diagnosis Note The bone marrow is variably cellular with trilineage hematopoiesis and non specific myeloid changes. In this background, the plasma cell component is variable ranging from less than 10 % in many areas to 30% in some aspirate particles with relatively low cellularity. Foci of interstitial infiltrates and small clusters are also seen in the clot section. Immunohistochemical stains show kappa light chain restriction in atypical plasma cell clusters and infiltrates consistent with plasma cell neoplasm. Correlation with cytogenetic and FISH studies is recommended. (BNS:gt, 01/25/16)   RADIOGRAPHIC STUDIES: None  ASSESSMENT: Shannon Rush 74 y.o. female with a history of Multiple Myeloma s/p autotransplant in August 2014 followed by maintenence low dose Revlimid therapy, and relapsed in 01/2016  PLAN:   1. IgG kappa Multiple myeloma s/p Auto SCT, intermediate risk, relapsed in 01/2016 --MM markers demonstrate a very good initial response (M protein baseline 2.6, down to 0.18 after induction chemo and transplant, stable around 0.5 since July 2015, but has gradually increased lately),   she unfortunately relapsed in July 2017 -I discussed her repeated bone marrow biopsy from 02/01/2016, which showed 10-30% plasma cells in her marrow -I agree with Dr. Gasparetto's recommendation to stop revlimid and change to Daratumumab, Pomalidomide and dexamethasone due to her disease relapse. I don't think she has resistance to Revlimid, she has been on very small dose of Revlimid due to her prior history of multiple infections. -She started first cycle DaraPD in 02/2016, but in 2 weeks she developed severe pancytopenia, pneumonia with sepsis required 3 weeks hospital stay. She did have a very nice response to the treatment, her M-protein has significantly reduced from 1.2 to 0.2, light chain levels normalized even with half cycle treatment  -I recommended her to change regiment to DaraVD, and started her on dara every 2 weeks for 2 doses, she tolerated this very well, pancytopenia was much improved, she has changed to weekly full dose, l will start weekly Velcade and dexamethasone today -She previously tolerated Velcade very well, no significant neuropathy  -She has recovered very well, lab reviewed, thrombocytopenia has resolved, her anemia also much improved. -24-hour urine collection every 3 months   2. Pancytopenia  -secondary to chemo  -WBC 3.0 and RBC 2.8 today, HGB 9.9 and Platelets 152 today -She again has developed a mild leukopenia, ANC normal, we'll follow up closely  3. CHF, CAD, inferior STEMI in 02/2014 -continue plavix, metoprolol and lipitor  -follow up with Dr. Nishan  -her recent echo showed EF 45-50% -her b/l pleural effusion much improved after lasix and thoracentesis, she is off lasix now   4. Osteoporosis -recently repeated bone density scan showed worsening osteoporosis, continue zometa, changed to every 3 months after 03/2015.  -she declined repeating DEXA  -continue Zometa every 3 months    Plan -Continue daratumumab every 2 weeks for now, next dose in 1  week -She will start weekly Velcade and dexamethasone 20 mg today -labs weekly -I will see her back in 2 weeks -She will have the week of Thanksgiving off per her request    All questions were answered. The patient knows to call the clinic with any problems, questions or concerns. We can certainly see the patient much sooner if necessary.  I spent 20 minutes counseling the patient face   to face. The total time spent in the appointment was 25 minutes.   This document serves as a record of services personally performed by [name], MD. It was created on her behalf by Arlyce Harman, a trained medical scribe. The creation of this record is based on the scribe's personal observations and the provider's statements to them. This document has been checked and approved by the attending provider.   [name]  05/17/2016

## 2016-05-15 DIAGNOSIS — C9000 Multiple myeloma not having achieved remission: Secondary | ICD-10-CM | POA: Diagnosis not present

## 2016-05-15 DIAGNOSIS — M6281 Muscle weakness (generalized): Secondary | ICD-10-CM | POA: Diagnosis not present

## 2016-05-16 ENCOUNTER — Other Ambulatory Visit: Payer: Self-pay | Admitting: Hematology

## 2016-05-17 ENCOUNTER — Other Ambulatory Visit (HOSPITAL_BASED_OUTPATIENT_CLINIC_OR_DEPARTMENT_OTHER): Payer: Medicare Other

## 2016-05-17 ENCOUNTER — Ambulatory Visit (HOSPITAL_BASED_OUTPATIENT_CLINIC_OR_DEPARTMENT_OTHER): Payer: Medicare Other

## 2016-05-17 ENCOUNTER — Encounter: Payer: Self-pay | Admitting: Hematology

## 2016-05-17 ENCOUNTER — Ambulatory Visit (HOSPITAL_BASED_OUTPATIENT_CLINIC_OR_DEPARTMENT_OTHER): Payer: Self-pay | Admitting: Hematology

## 2016-05-17 VITALS — BP 107/57 | HR 72 | Temp 97.8°F | Resp 17 | Ht 66.0 in | Wt 120.0 lb

## 2016-05-17 DIAGNOSIS — M818 Other osteoporosis without current pathological fracture: Secondary | ICD-10-CM

## 2016-05-17 DIAGNOSIS — Z5112 Encounter for antineoplastic immunotherapy: Secondary | ICD-10-CM

## 2016-05-17 DIAGNOSIS — C9002 Multiple myeloma in relapse: Secondary | ICD-10-CM

## 2016-05-17 DIAGNOSIS — C9000 Multiple myeloma not having achieved remission: Secondary | ICD-10-CM

## 2016-05-17 DIAGNOSIS — T451X5A Adverse effect of antineoplastic and immunosuppressive drugs, initial encounter: Secondary | ICD-10-CM

## 2016-05-17 DIAGNOSIS — I5022 Chronic systolic (congestive) heart failure: Secondary | ICD-10-CM

## 2016-05-17 DIAGNOSIS — I252 Old myocardial infarction: Secondary | ICD-10-CM

## 2016-05-17 DIAGNOSIS — D6481 Anemia due to antineoplastic chemotherapy: Secondary | ICD-10-CM

## 2016-05-17 LAB — CBC WITH DIFFERENTIAL/PLATELET
BASO%: 1.5 % (ref 0.0–2.0)
BASOS ABS: 0 10*3/uL (ref 0.0–0.1)
EOS ABS: 0.1 10*3/uL (ref 0.0–0.5)
EOS%: 2.3 % (ref 0.0–7.0)
HEMATOCRIT: 29.9 % — AB (ref 34.8–46.6)
HEMOGLOBIN: 9.9 g/dL — AB (ref 11.6–15.9)
LYMPH#: 0.4 10*3/uL — AB (ref 0.9–3.3)
LYMPH%: 14.9 % (ref 14.0–49.7)
MCH: 35.4 pg — AB (ref 25.1–34.0)
MCHC: 33.2 g/dL (ref 31.5–36.0)
MCV: 106.9 fL — AB (ref 79.5–101.0)
MONO#: 0.3 10*3/uL (ref 0.1–0.9)
MONO%: 10.5 % (ref 0.0–14.0)
NEUT%: 70.8 % (ref 38.4–76.8)
NEUTROS ABS: 2.1 10*3/uL (ref 1.5–6.5)
Platelets: 152 10*3/uL (ref 145–400)
RBC: 2.8 10*6/uL — ABNORMAL LOW (ref 3.70–5.45)
RDW: 25 % — AB (ref 11.2–14.5)
WBC: 3 10*3/uL — ABNORMAL LOW (ref 3.9–10.3)

## 2016-05-17 LAB — COMPREHENSIVE METABOLIC PANEL
ALBUMIN: 3.6 g/dL (ref 3.5–5.0)
ALK PHOS: 77 U/L (ref 40–150)
ALT: 15 U/L (ref 0–55)
AST: 17 U/L (ref 5–34)
Anion Gap: 7 mEq/L (ref 3–11)
BILIRUBIN TOTAL: 0.85 mg/dL (ref 0.20–1.20)
BUN: 15.3 mg/dL (ref 7.0–26.0)
CALCIUM: 8.6 mg/dL (ref 8.4–10.4)
CO2: 25 mEq/L (ref 22–29)
Chloride: 109 mEq/L (ref 98–109)
Creatinine: 0.7 mg/dL (ref 0.6–1.1)
EGFR: 86 mL/min/{1.73_m2} — AB (ref >90)
Glucose: 68 mg/dl — ABNORMAL LOW (ref 70–140)
POTASSIUM: 3.6 meq/L (ref 3.5–5.1)
Sodium: 141 mEq/L (ref 136–145)
TOTAL PROTEIN: 6.1 g/dL — AB (ref 6.4–8.3)

## 2016-05-17 MED ORDER — PROCHLORPERAZINE MALEATE 10 MG PO TABS
10.0000 mg | ORAL_TABLET | Freq: Once | ORAL | Status: AC
  Administered 2016-05-17: 10 mg via ORAL

## 2016-05-17 MED ORDER — BORTEZOMIB CHEMO SQ INJECTION 3.5 MG (2.5MG/ML)
1.3000 mg/m2 | Freq: Once | INTRAMUSCULAR | Status: AC
  Administered 2016-05-17: 2 mg via SUBCUTANEOUS
  Filled 2016-05-17: qty 2

## 2016-05-17 MED ORDER — PROCHLORPERAZINE MALEATE 10 MG PO TABS
ORAL_TABLET | ORAL | Status: AC
  Filled 2016-05-17: qty 1

## 2016-05-17 MED ORDER — BORTEZOMIB CHEMO IV INJECTION 3.5 MG
1.3000 mg/m2 | Freq: Once | INTRAMUSCULAR | Status: DC
  Filled 2016-05-17: qty 2.1

## 2016-05-17 NOTE — Patient Instructions (Signed)
Bortezomib injection What is this medicine? BORTEZOMIB (bor TEZ oh mib) is a medicine that targets proteins in cancer cells and stops the cancer cells from growing. It is used to treat multiple myeloma and mantle-cell lymphoma. This medicine may be used for other purposes; ask your health care provider or pharmacist if you have questions. What should I tell my health care provider before I take this medicine? They need to know if you have any of these conditions: -diabetes -heart disease -irregular heartbeat -liver disease -on hemodialysis -low blood counts, like low white blood cells, platelets, or hemoglobin -peripheral neuropathy -taking medicine for blood pressure -an unusual or allergic reaction to bortezomib, mannitol, boron, other medicines, foods, dyes, or preservatives -pregnant or trying to get pregnant -breast-feeding How should I use this medicine? This medicine is for injection into a vein or for injection under the skin. It is given by a health care professional in a hospital or clinic setting. Talk to your pediatrician regarding the use of this medicine in children. Special care may be needed. Overdosage: If you think you have taken too much of this medicine contact a poison control center or emergency room at once. NOTE: This medicine is only for you. Do not share this medicine with others. What if I miss a dose? It is important not to miss your dose. Call your doctor or health care professional if you are unable to keep an appointment. What may interact with this medicine? This medicine may interact with the following medications: -ketoconazole -rifampin -ritonavir -St. John's Wort This list may not describe all possible interactions. Give your health care provider a list of all the medicines, herbs, non-prescription drugs, or dietary supplements you use. Also tell them if you smoke, drink alcohol, or use illegal drugs. Some items may interact with your medicine. What  should I watch for while using this medicine? Visit your doctor for checks on your progress. This drug may make you feel generally unwell. This is not uncommon, as chemotherapy can affect healthy cells as well as cancer cells. Report any side effects. Continue your course of treatment even though you feel ill unless your doctor tells you to stop. You may get drowsy or dizzy. Do not drive, use machinery, or do anything that needs mental alertness until you know how this medicine affects you. Do not stand or sit up quickly, especially if you are an older patient. This reduces the risk of dizzy or fainting spells. In some cases, you may be given additional medicines to help with side effects. Follow all directions for their use. Call your doctor or health care professional for advice if you get a fever, chills or sore throat, or other symptoms of a cold or flu. Do not treat yourself. This drug decreases your body's ability to fight infections. Try to avoid being around people who are sick. This medicine may increase your risk to bruise or bleed. Call your doctor or health care professional if you notice any unusual bleeding. You may need blood work done while you are taking this medicine. In some patients, this medicine may cause a serious brain infection that may cause death. If you have any problems seeing, thinking, speaking, walking, or standing, tell your doctor right away. If you cannot reach your doctor, urgently seek other source of medical care. Do not become pregnant while taking this medicine. Women should inform their doctor if they wish to become pregnant or think they might be pregnant. There is a potential for serious  side effects to an unborn child. Talk to your health care professional or pharmacist for more information. Do not breast-feed an infant while taking this medicine. Check with your doctor or health care professional if you get an attack of severe diarrhea, nausea and vomiting, or if  you sweat a lot. The loss of too much body fluid can make it dangerous for you to take this medicine. What side effects may I notice from receiving this medicine? Side effects that you should report to your doctor or health care professional as soon as possible: -allergic reactions like skin rash, itching or hives, swelling of the face, lips, or tongue -breathing problems -changes in hearing -changes in vision -fast, irregular heartbeat -feeling faint or lightheaded, falls -pain, tingling, numbness in the hands or feet -right upper belly pain -seizures -swelling of the ankles, feet, hands -unusual bleeding or bruising -unusually weak or tired -vomiting -yellowing of the eyes or skin Side effects that usually do not require medical attention (report to your doctor or health care professional if they continue or are bothersome): -changes in emotions or moods -constipation -diarrhea -loss of appetite -headache -irritation at site where injected -nausea This list may not describe all possible side effects. Call your doctor for medical advice about side effects. You may report side effects to FDA at 1-800-FDA-1088. Where should I keep my medicine? This drug is given in a hospital or clinic and will not be stored at home. NOTE: This sheet is a summary. It may not cover all possible information. If you have questions about this medicine, talk to your doctor, pharmacist, or health care provider.    2016, [employer] Standard. (2014-08-30 14:47:04)

## 2016-05-23 ENCOUNTER — Encounter: Payer: Self-pay | Admitting: Hematology

## 2016-05-23 ENCOUNTER — Other Ambulatory Visit (HOSPITAL_BASED_OUTPATIENT_CLINIC_OR_DEPARTMENT_OTHER): Payer: Medicare Other

## 2016-05-23 ENCOUNTER — Ambulatory Visit (HOSPITAL_BASED_OUTPATIENT_CLINIC_OR_DEPARTMENT_OTHER): Payer: Medicare Other | Admitting: Hematology

## 2016-05-23 VITALS — BP 129/65 | HR 75 | Temp 98.0°F | Resp 18 | Ht 66.0 in | Wt 122.6 lb

## 2016-05-23 DIAGNOSIS — D6481 Anemia due to antineoplastic chemotherapy: Secondary | ICD-10-CM | POA: Diagnosis not present

## 2016-05-23 DIAGNOSIS — C9000 Multiple myeloma not having achieved remission: Secondary | ICD-10-CM

## 2016-05-23 DIAGNOSIS — M818 Other osteoporosis without current pathological fracture: Secondary | ICD-10-CM

## 2016-05-23 DIAGNOSIS — I5022 Chronic systolic (congestive) heart failure: Secondary | ICD-10-CM

## 2016-05-23 DIAGNOSIS — C9002 Multiple myeloma in relapse: Secondary | ICD-10-CM | POA: Diagnosis not present

## 2016-05-23 DIAGNOSIS — T451X5A Adverse effect of antineoplastic and immunosuppressive drugs, initial encounter: Secondary | ICD-10-CM

## 2016-05-23 DIAGNOSIS — I252 Old myocardial infarction: Secondary | ICD-10-CM | POA: Diagnosis not present

## 2016-05-23 LAB — COMPREHENSIVE METABOLIC PANEL
ALT: 13 U/L (ref 0–55)
AST: 15 U/L (ref 5–34)
Albumin: 3.5 g/dL (ref 3.5–5.0)
Alkaline Phosphatase: 69 U/L (ref 40–150)
Anion Gap: 10 mEq/L (ref 3–11)
BUN: 13.8 mg/dL (ref 7.0–26.0)
CALCIUM: 8.9 mg/dL (ref 8.4–10.4)
CHLORIDE: 108 meq/L (ref 98–109)
CO2: 22 meq/L (ref 22–29)
CREATININE: 0.7 mg/dL (ref 0.6–1.1)
EGFR: 86 mL/min/{1.73_m2} — ABNORMAL LOW (ref >90)
Glucose: 103 mg/dl (ref 70–140)
POTASSIUM: 3.4 meq/L — AB (ref 3.5–5.1)
Sodium: 140 mEq/L (ref 136–145)
Total Bilirubin: 0.87 mg/dL (ref 0.20–1.20)
Total Protein: 6.1 g/dL — ABNORMAL LOW (ref 6.4–8.3)

## 2016-05-23 LAB — CBC WITH DIFFERENTIAL/PLATELET
BASO%: 0.9 % (ref 0.0–2.0)
BASOS ABS: 0 10*3/uL (ref 0.0–0.1)
EOS%: 1.6 % (ref 0.0–7.0)
Eosinophils Absolute: 0.1 10*3/uL (ref 0.0–0.5)
HCT: 30.2 % — ABNORMAL LOW (ref 34.8–46.6)
HGB: 10 g/dL — ABNORMAL LOW (ref 11.6–15.9)
LYMPH%: 12.4 % — AB (ref 14.0–49.7)
MCH: 35.9 pg — AB (ref 25.1–34.0)
MCHC: 33.2 g/dL (ref 31.5–36.0)
MCV: 108 fL — ABNORMAL HIGH (ref 79.5–101.0)
MONO#: 0.4 10*3/uL (ref 0.1–0.9)
MONO%: 13.7 % (ref 0.0–14.0)
NEUT#: 2.3 10*3/uL (ref 1.5–6.5)
NEUT%: 71.4 % (ref 38.4–76.8)
Platelets: 140 10*3/uL — ABNORMAL LOW (ref 145–400)
RBC: 2.8 10*6/uL — AB (ref 3.70–5.45)
RDW: 23.8 % — ABNORMAL HIGH (ref 11.2–14.5)
WBC: 3.3 10*3/uL — ABNORMAL LOW (ref 3.9–10.3)
lymph#: 0.4 10*3/uL — ABNORMAL LOW (ref 0.9–3.3)

## 2016-05-23 NOTE — Progress Notes (Signed)
[facility] ONCOL[facility] OFFICE PROGRESS NOTE   Rush,Shannon ARTHUR, MD [address] [name] [address]  DIAGNOSIS: Multiple myeloma with relapse  CHIEF COMPLAINS: Follow-up of multiple myeloma  CURRENT TREATMENT:   1. Pomalidomide 4 mg daily on day 1-21, dexamethasone 40 mg on day 1, 8 and 15, every 28 days, started on 02/27/2016, daratumumab weekly started on 03/07/2016, treatment on hold since 03/09/2016 due to hospitalization and severe cytopenia 2. Daratumumab restarted on 04/26/2016, Velcade 1.3mg/m2 and dexa 20mg weekly start on 11/3 2. Zometa 3.5 mg monthly started on 06/2013, on every 3 months now, last 05/03/2016   Oncology History   Multiple myeloma (HCC)   Staging form: Multiple Myeloma, AJCC 6th Edition   - Clinical: Stage IIIA - Signed by David Chism, MD on 09/04/2013      Multiple myeloma (HCC)   05/26/2012 Initial Diagnosis    Presenting IgG was 4,040 mg/dL on 06/05/2012 (IgA 35; IgM 34); kappa free light chain 34.4 mg/dL, lambda 0.00, kappa:lambda ratio of 34.75; SPEP with M-spike of 2.60.      06/30/2012 Imaging    Numerous lytic lesions throughout the calvarium.  Lytic lesion within the left lateral scapula.  Findings most compatible with metastases or myeloma. Multiple mid and lower thoracic compression fractures.  Slight compression through the endplates at L3.      07/03/2012 Bone Marrow Biopsy    Bone marrow biopsy showed 49% plasma cell. Normal classical cytogenetics; however, myeloma FISH panel showed 13q- (intermediate risk)          07/14/2012 - 07/27/2012 Radiation Therapy    Palliative radiation 20 Gy over 10 fractions between to thoracic spine cord compression and symptomatic left scapula lytic lesion.      08/10/2012 -  Chemotherapy    Started SQ Velcade once weekly, 3 weeks on, 1 weeks off; daily Revlimid d1-21, 7 days off; and Dexamethasone 40mg PO weekly.       02/12/2013 Bone Marrow Transplant    Auto bone marrow  transplant at [facility]        06/14/2013 Tumor Marker    IgG  1830,  Kappa:lamba ratio, 1.69 (baseline was 34.75 or   M-spike 0.28 (baseline of 2.6 or  89.3% of baseline).        06/25/2013 -  Chemotherapy    Started zometa 3.5 mg monthly       09/01/2013 -  Chemotherapy    Maintenance therapy with revlimid 5mg daily for 14 days and then off for 7 (decreased from 21 days on and 7 days off based on neutropenia).       09/13/2013 - 09/17/2014 Hospital Admission    Hospital admission for pneumonia      12/20/2013 Treatment Plan Change    Maintenance therapy decreased to 2.5 mg daily for 21 days and then off for 7 days based on low counts.       01/25/2014 Tumor Marker    IgG 1360 M spike 0.5      03/01/2014 - 03/05/2014 Hospital Admission    [facility] with an inferior STEMI, received thrombolytic therapy, cath showed 60% prox RCA, mid-distal RCA 50%, Echo normal, no stents.      04/11/2014 - 08/07/2014 Chemotherapy    Continue Revlimid at 2.5 mg 3 weeks on 1 week off      08/08/2014 - 08/13/2014 Hospital Admission    Hospital admission for pneumonia. Her Revlimid was held.      08/29/2014 - 09/14/2014 Chemotherapy      Maintenance Revlimid restarted      09/14/2014 - 09/17/2014 Hospital Admission    Admitted for pneumonia after coming back from a trip in [name]. Revlimid was held again.      11/13/2014 - 12/25/2015 Chemotherapy    Maintenance Revlimid restarted, changed to 2.'5mg'$  daily, 2 weks on, 1 week off from 12/15/2014, stopped due to disease progression       01/24/2016 Progression    Patient is M protein has gradually increased to 1.0g, repeated a bone marrow biopsy showed plasma cell 10-30%      02/27/2016 -  Chemotherapy    Pomalidomide 4 mg daily on day 1-21, dexamethasone 40 mg on day 1, 8 and 15, every 28 days, started on 02/27/2016, daratumumab weekly started on 03/07/2016, held after first dose, due to hospitalization and a severe pancytopenia       03/10/2016 - 03/29/2016 Hospital Admission    Pt was admitted for sepsis from pneumonia, and severe pancytopenia. She required ICU stay for a few days due to hypotension, developed b/l pleural effusion required diuretics and b/l thoracentesis. She also required blood and plt transfusion, and prolonged neupogen injection for severe neutropenia. She was discharged home after 3 weeks hospital stay       04/26/2016 -  Chemotherapy    Daratumumab restarted on 04/26/2016, Velcade 1.'3mg'$ /m2 and dexa '20mg'$  weekly start on 11/3      INTERVAL HISTORY:  Shannon Rush 75 y.o. female with a past medical history of MM as detailed above, s/p SCT is here for follow-up.   Shannon Rush is doing well overall. She tolerated dexamethasone and Velcade injection very well last week, without noticeable side effects. She has good appetite and energy level, remains to be physically active. No other new complaints.  MEDICAL HISTORY: Past Medical History:  Diagnosis Date  . CAD (coronary artery disease)    a. inf STEMI (in [place] >>> [facility]) >>> LHC (8/15- [place]):  pRCA 60%, mid to dist RCA 50% >>> med rx  . Cord compression ([place]) 07/13/12   MRI- diffuse myeloma involvement of T-L spine  . History of radiation therapy 07/13/12-07/27/12   spinal cord compression T3-T10,left scapula  . Hx of echocardiogram    Echo (03/02/14-done at [facility]):  Normal LVEF without significant regional wall motion abnormalities. Mild   . Multiple myeloma ([place]) 07/01/2012  . OSTEOPOROSIS 06/11/2010   Multiple compression fractures; and spontaneous fracture of sternum Qualifier: Diagnosis of  By: [name], [name]     . Thoracic kyphosis 07/13/12   per MRI scan  . Unspecified deficiency anemia      ALLERGIES:  is allergic to zosyn [piperacillin sod-tazobactam so]; other; piperacillin-tazobactam in dex; zithromax [azithromycin]; and ciprofloxacin.  MEDICATIONS: has a current medication  list which includes the following prescription(s): acetaminophen, acyclovir, atorvastatin, calcium carbonate, clopidogrel, dexamethasone, fluticasone, metoprolol succinate, nitroglycerin, ondansetron, proair hfa, and prochlorperazine, and the following Facility-Administered Medications: sodium chloride, sodium chloride, acetaminophen, acetaminophen, furosemide, furosemide, heparin lock flush, heparin lock flush, heparin lock flush, heparin lock flush, sodium chloride flush, sodium chloride flush, sodium chloride flush, sodium chloride flush, and sodium chloride flush.  SURGICAL HISTORY:  Past Surgical History:  Procedure Laterality Date  . APPENDECTOMY    . CESAREAN SECTION     x2   . COLONOSCOPY  2007   neg with Dr. [name]  . ELBOW SURGERY    . HIP SURGERY  2009   left  . TUBAL LIGATION  REVIEW OF SYSTEMS:   Constitutional: Denies abnormal weight loss Eyes: Denies blurriness of vision Ears, nose, mouth, throat, and face: Denies mucositis or sore throat,  Respiratory: Denies dyspnea or wheezes (+) mild productive cough Cardiovascular: Denies palpitation, chest discomfort (+) mild lower extremity swelling Gastrointestinal:  Denies nausea, heartburn or change in bowel habits Skin: Denies abnormal skin rashes Lymphatics: Denies new lymphadenopathy or easy bruising Neurological:Denies numbness, tingling or new weaknesses Behavioral/Psych: Mood is stable, no new changes  All other systems were reviewed with the patient and are negative.   PHYSICAL EXAMINATION: ECOG PERFORMANCE STATUS: 1 Blood pressure 129/65, pulse 75, temperature 98 F (36.7 C), temperature source Oral, resp. rate 18, height '5\' 6"'$  (1.676 m), weight 122 lb 9.6 oz (55.6 kg), SpO2 98 %. GENERAL:alert, no distress and comfortable; easily mobile to exam table; kyphosis, moderate.  SKIN: skin color, texture, turgor are normal, no rashes or significant lesions; Abrasion well healed on RLE.  EYES: normal, Conjunctiva are  pink, 1 tear/injectionof blood vessel left eye in sclera. OROPHARYNX:no exudate, no erythema and lips, buccal mucosa, and tongue normal, no ONJ NECK: supple, thyroid normal size, non-tender, without nodularity; nodules cervical bilaterally.  LYMPH:  no palpable lymphadenopathy in the cervical, axillary or supraclavicular LUNGS: crackles at the bases bilaterally with normal breathing effort, no wheezes or rhonchi HEART: regular rate & rhythm and no murmurs (+) trace lower extremity edema ABDOMEN:abdomen soft, non-tender and normal bowel sounds Musculoskeletal:no cyanosis of digits and no clubbing, (+) spine scoliosis and mild tenderness  NEURO: alert & oriented x 3 with fluent speech, no focal motor/sensory deficits   Labs:  CBC Latest Ref Rng & Units 05/23/2016 05/17/2016 05/10/2016  WBC 3.9 - 10.3 10e3/uL 3.3(L) 3.0(L) 4.0  Hemoglobin 11.6 - 15.9 g/dL 10.0(L) 9.9(L) 10.7(L)  Hematocrit 34.8 - 46.6 % 30.2(L) 29.9(L) 32.7(L)  Platelets 145 - 400 10e3/uL 140(L) 152 174    CMP Latest Ref Rng & Units 05/23/2016 05/17/2016 05/10/2016  Glucose 70 - 140 mg/dl 103 68(L) 108  BUN 7.0 - 26.0 mg/dL 13.8 15.3 13.8  Creatinine 0.6 - 1.1 mg/dL 0.7 0.7 0.7  Sodium 136 - 145 mEq/L 140 141 139  Potassium 3.5 - 5.1 mEq/L 3.4(L) 3.6 3.7  Chloride 101 - 111 mmol/L - - -  CO2 22 - 29 mEq/L 22 25 21(L)  Calcium 8.4 - 10.4 mg/dL 8.9 8.6 8.9  Total Protein 6.4 - 8.3 g/dL 6.1(L) 6.1(L) 6.7  Total Bilirubin 0.20 - 1.20 mg/dL 0.87 0.85 0.83  Alkaline Phos 40 - 150 U/L 69 77 91  AST 5 - 34 U/L '15 17 19  '$ ALT 0 - 55 U/L '13 15 15   '$ ANC 2.3K  SPEP M-PROTEIN 10/01/2013: 0.18 (nadir)  10/13/2014: 0.6 11/21/2014: 0.5 01/30/2015: 0.6 03/13/2015: 0.6 04/20/2015: 0.5 06/01/2015: 0.5 07/20/2015: 0.7 09/14/2015: 0.7 10/26/2015: 0.8 12/07/2015: 1.0 02/07/2016: 1.1  04/03/2016: 0.2 05/03/2016: 0.2   IgG ([id number]) 10/13/2014: 1710 11/21/2014: 1460 01/30/2015: 1380 03/13/15: 1570 04/20/2015: 1550 06/01/2015: 1590 07/22/2015:  1770 09/14/2015: 1787 10/26/2015: 1898 12/07/2015: 2045 02/07/2016: 1883 04/03/2016: 787 05/03/2016: 685   Serum kappa, lambda light chains, and ratio  11/21/2014: 3.95, 2.08, 1.90  01/30/2015: 3.92, 2.64, 1.48 03/13/2015: 3.55, 2.17, 1.64 04/20/15: 4.52, 1.78, 2.54 06/01/2015: 4.82, 1.75, 2.75 07/20/2015: 8.71, 2.43, 3.58 09/14/2015: 9.17, 2.02, 4.55 10/26/2015: 11.4, 1.75, 6.42 12/07/2015: 1.54, 1.83, 8.44 02/07/2016: 1.93, 1.42, 13.6 04/03/2016: 0.96, 0.95, 1.01  05/03/2016: 0.71, 0.34, 2.09   24 hr urine PEP (total protein, M-spike) 10/13/2014: (-) 02/12/2016: '265mg'$ , '68mg'$  04/05/2016: 114  mg, (-)  PATHOLOGY REPORT  Diagnosis 01/24/2016 Bone Marrow, Aspirate,Biopsy, and Clot, RT iliac and core - VARIABLY CELLULAR BONE MARROW WITH PLASMA CELL NEOPLASM. - SEE COMMENT. PERIPHERAL BLOOD: - MACROCYTIC ANEMIA. Diagnosis Note The bone marrow is variably cellular with trilineage hematopoiesis and non specific myeloid changes. In this background, the plasma cell component is variable ranging from less than 10 % in many areas to 30% in some aspirate particles with relatively low cellularity. Foci of interstitial infiltrates and small clusters are also seen in the clot section. Immunohistochemical stains show kappa light chain restriction in atypical plasma cell clusters and infiltrates consistent with plasma cell neoplasm. Correlation with cytogenetic and FISH studies is recommended. (BNS:gt, 01-28-2016)   RADIOGRAPHIC STUDIES: None  ASSESSMENT: Shannon Rush 75 y.o. female with a history of Multiple Myeloma s/p autotransplant in August 2014 followed by maintenence low dose Revlimid therapy, and relapsed in 01/2016  PLAN:   1. IgG kappa Multiple myeloma s/p Auto SCT, intermediate risk, relapsed in 01/2016 --MM markers demonstrate a very good initial response (M protein baseline 2.6, down to 0.18 after induction chemo and transplant, stable around 0.5 since July 2015, but has gradually increased lately),   she unfortunately relapsed in July 2017 -I discussed her repeated bone marrow biopsy from 02/01/2016, which showed 10-30% plasma cells in her marrow -I agree with Dr. Kendell Bane recommendation to stop revlimid and change to Daratumumab, Pomalidomide and dexamethasone due to her disease relapse. I don't think she has resistance to Revlimid, she has been on very small dose of Revlimid due to her prior history of multiple infections. -She started first cycle DaraPD in 02/2016, but in 2 weeks she developed severe pancytopenia, pneumonia with sepsis required 3 weeks hospital stay. She did have a very nice response to the treatment, her M-protein has significantly reduced from 1.2 to 0.2, light chain levels normalized even with half cycle treatment  -I recommended her to change regiment to DaraVD, and started her on dara every 2 weeks for 2 doses, she tolerated this very well -lab reviewed, will proceed full regimen weekly Dara, Velcade and dexa '20mg'$  tomorrow  -We'll monitor her labs closely  2. Pancytopenia  -secondary to chemo  -Mild leukopenia, no neutropenia, mild anemia stable, platelet to 140 K today.  -Lab weekly  3. CHF, CAD, inferior STEMI in 02/2014 -continue plavix, metoprolol and lipitor  -follow up with Dr. Johnsie Cancel  -her recent echo showed EF 45-50% -her b/l pleural effusion much improved after lasix and thoracentesis, she is off lasix now   4. Osteoporosis -recently repeated bone density scan showed worsening osteoporosis, continue zometa, changed to every 3 months after 03/2015.  -she declined repeating DEXA  -continue Zometa every 3 months, next due in Jan 2018    Plan -will proceed 4th dose daratumumab tomorrow, along with second week Velcade and dexa '20mg'$ , if her counts stable, will continue weekly afterwards -I will see her next Friday before treatment  -She will have the week of Thanksgiving off due to travel    All questions were answered. The patient knows to call the  clinic with any problems, questions or concerns. We can certainly see the patient much sooner if necessary.  I spent 15 minutes counseling the patient face to face. The total time spent in the appointment was 20 minutes.   This document serves as a record of services personally performed by [name], MD. It was created on her behalf by Arlyce Harman, a trained medical scribe. The creation  of this record is based on the scribe's personal observations and the provider's statements to them. This document has been checked and approved by the attending provider.   [name]  05/23/2016

## 2016-05-24 ENCOUNTER — Ambulatory Visit (HOSPITAL_BASED_OUTPATIENT_CLINIC_OR_DEPARTMENT_OTHER): Payer: Medicare Other

## 2016-05-24 ENCOUNTER — Encounter: Payer: Self-pay | Admitting: General Practice

## 2016-05-24 VITALS — BP 106/66 | HR 84 | Temp 98.9°F | Resp 18

## 2016-05-24 DIAGNOSIS — C9002 Multiple myeloma in relapse: Secondary | ICD-10-CM

## 2016-05-24 DIAGNOSIS — Z5112 Encounter for antineoplastic immunotherapy: Secondary | ICD-10-CM | POA: Diagnosis not present

## 2016-05-24 DIAGNOSIS — C9000 Multiple myeloma not having achieved remission: Secondary | ICD-10-CM

## 2016-05-24 MED ORDER — ACETAMINOPHEN 325 MG PO TABS
650.0000 mg | ORAL_TABLET | Freq: Once | ORAL | Status: AC
  Administered 2016-05-24: 650 mg via ORAL

## 2016-05-24 MED ORDER — SODIUM CHLORIDE 0.9 % IV SOLN
16.0000 mg/kg | Freq: Once | INTRAVENOUS | Status: AC
  Administered 2016-05-24: 900 mg via INTRAVENOUS
  Filled 2016-05-24: qty 40

## 2016-05-24 MED ORDER — ACETAMINOPHEN 325 MG PO TABS
ORAL_TABLET | ORAL | Status: AC
  Filled 2016-05-24: qty 2

## 2016-05-24 MED ORDER — BORTEZOMIB CHEMO SQ INJECTION 3.5 MG (2.5MG/ML)
1.3000 mg/m2 | Freq: Once | INTRAMUSCULAR | Status: AC
  Administered 2016-05-24: 2 mg via SUBCUTANEOUS
  Filled 2016-05-24: qty 2

## 2016-05-24 MED ORDER — MONTELUKAST SODIUM 10 MG PO TABS
10.0000 mg | ORAL_TABLET | Freq: Once | ORAL | Status: AC
  Administered 2016-05-24: 10 mg via ORAL
  Filled 2016-05-24: qty 1

## 2016-05-24 MED ORDER — METHYLPREDNISOLONE SODIUM SUCC 125 MG IJ SOLR
INTRAMUSCULAR | Status: AC
  Filled 2016-05-24: qty 2

## 2016-05-24 MED ORDER — METHYLPREDNISOLONE SODIUM SUCC 125 MG IJ SOLR
125.0000 mg | Freq: Once | INTRAMUSCULAR | Status: AC
  Administered 2016-05-24: 125 mg via INTRAVENOUS

## 2016-05-24 MED ORDER — SODIUM CHLORIDE 0.9 % IV SOLN
Freq: Once | INTRAVENOUS | Status: AC
  Administered 2016-05-24: 09:00:00 via INTRAVENOUS

## 2016-05-24 MED ORDER — DIPHENHYDRAMINE HCL 25 MG PO CAPS
50.0000 mg | ORAL_CAPSULE | Freq: Once | ORAL | Status: AC
  Administered 2016-05-24: 50 mg via ORAL

## 2016-05-24 MED ORDER — DIPHENHYDRAMINE HCL 25 MG PO CAPS
ORAL_CAPSULE | ORAL | Status: AC
  Filled 2016-05-24: qty 2

## 2016-05-24 MED ORDER — PROCHLORPERAZINE MALEATE 10 MG PO TABS
10.0000 mg | ORAL_TABLET | Freq: Once | ORAL | Status: AC
  Administered 2016-05-24: 10 mg via ORAL

## 2016-05-24 MED ORDER — PROCHLORPERAZINE MALEATE 10 MG PO TABS
ORAL_TABLET | ORAL | Status: AC
  Filled 2016-05-24: qty 1

## 2016-05-24 NOTE — Patient Instructions (Signed)
[facility] Discharge Instructions for Patients Receiving Chemotherapy  Today you received the following chemotherapy agents: Darzelex.  To help prevent nausea and vomiting after your treatment, we encourage you to take your nausea medication as prescribed. If you develop nausea and vomiting that is not controlled by your nausea medication, call the clinic.   BELOW ARE SYMPTOMS THAT SHOULD BE REPORTED IMMEDIATELY:  *FEVER GREATER THAN 100.5 F  *CHILLS WITH OR WITHOUT FEVER  NAUSEA AND VOMITING THAT IS NOT CONTROLLED WITH YOUR NAUSEA MEDICATION  *UNUSUAL SHORTNESS OF BREATH  *UNUSUAL BRUISING OR BLEEDING  TENDERNESS IN MOUTH AND THROAT WITH OR WITHOUT PRESENCE OF ULCERS  *URINARY PROBLEMS  *BOWEL PROBLEMS  UNUSUAL RASH Items with * indicate a potential emergency and should be followed up as soon as possible.  Feel free to call the clinic you have any questions or concerns. The clinic phone number is [phone].  Please show the [place] at check-in to the Emergency Department and triage nurse.

## 2016-05-24 NOTE — Addendum Note (Signed)
Addended by: [name] on: 05/24/2016 11:33 AM   Modules accepted: Orders

## 2016-05-24 NOTE — Progress Notes (Signed)
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  She welcomed opportunity for uplifting, reflective conversation and shared readily about sources of meaning-making.  She is aware of Multiple Myeloma Support Group, should she desire to attend, but at this time feels no emotional need for group participation.  Shannon Rush describes herself as someone who "puts [her] trust in [her] doctors" without needing to conduct deep personal research into her clinical situation.  Of Support Center resources and programs, she is most excited to learn of massage therapy and plans to pursue her two free sessions.  Per pt, she has good support and is aware of ongoing Support Team availability, should additional needs arise.    [place], [place], [facility] Pager [phone] Voicemail [phone]

## 2016-05-30 NOTE — Progress Notes (Signed)
[facility] ONCOLOGY OFFICE PROGRESS NOTE   [name],[name], MD [address] [place] [address]  DIAGNOSIS: Multiple myeloma with relapse  CHIEF COMPLAINS: Follow-up of multiple myeloma  CURRENT TREATMENT:   1. Pomalidomide 4 mg daily on day 1-21, dexamethasone 40 mg on day 1, 8 and 15, every 28 days, started on 02/27/2016, daratumumab weekly started on 03/07/2016, treatment on hold since 03/09/2016 due to hospitalization and severe cytopenia 2. Daratumumab restarted on 04/26/2016, Velcade 1.3mg/m2 and dexa 20mg weekly start on 11/3 2. Zometa 3.5 mg monthly started on 06/2013, on every 3 months now, last 05/03/2016   Oncology History   Multiple myeloma (HCC)   Staging form: Multiple Myeloma, AJCC 6th Edition   - Clinical: Stage IIIA - Signed by David Chism, MD on 09/04/2013      Multiple myeloma (HCC)   05/26/2012 Initial Diagnosis    Presenting IgG was 4,040 mg/dL on 06/05/2012 (IgA 35; IgM 34); kappa free light chain 34.4 mg/dL, lambda 0.00, kappa:lambda ratio of 34.75; SPEP with M-spike of 2.60.      06/30/2012 Imaging    Numerous lytic lesions throughout the calvarium.  Lytic lesion within the left lateral scapula.  Findings most compatible with metastases or myeloma. Multiple mid and lower thoracic compression fractures.  Slight compression through the endplates at L3.      07/03/2012 Bone Marrow Biopsy    Bone marrow biopsy showed 49% plasma cell. Normal classical cytogenetics; however, myeloma FISH panel showed 13q- (intermediate risk)          07/14/2012 - 07/27/2012 Radiation Therapy    Palliative radiation 20 Gy over 10 fractions between to thoracic spine cord compression and symptomatic left scapula lytic lesion.      08/10/2012 -  Chemotherapy    Started SQ Velcade once weekly, 3 weeks on, 1 weeks off; daily Revlimid d1-21, 7 days off; and Dexamethasone 40mg PO weekly.       02/12/2013 Bone Marrow Transplant    Auto bone marrow  transplant at [facility]        06/14/2013 Tumor Marker    IgG  1830,  Kappa:lamba ratio, 1.69 (baseline was 34.75 or   M-spike 0.28 (baseline of 2.6 or  89.3% of baseline).        06/25/2013 -  Chemotherapy    Started zometa 3.5 mg monthly       09/01/2013 -  Chemotherapy    Maintenance therapy with revlimid 5mg daily for 14 days and then off for 7 (decreased from 21 days on and 7 days off based on neutropenia).       09/13/2013 - 09/17/2014 Hospital Admission    Hospital admission for pneumonia      12/20/2013 Treatment Plan Change    Maintenance therapy decreased to 2.5 mg daily for 21 days and then off for 7 days based on low counts.       01/25/2014 Tumor Marker    IgG 1360 M spike 0.5      03/01/2014 - 03/05/2014 Hospital Admission    [facility] with an inferior STEMI, received thrombolytic therapy, cath showed 60% prox RCA, mid-distal RCA 50%, Echo normal, no stents.      04/11/2014 - 08/07/2014 Chemotherapy    Continue Revlimid at 2.5 mg 3 weeks on 1 week off      08/08/2014 - 08/13/2014 Hospital Admission    Hospital admission for pneumonia. Her Revlimid was held.      08/29/2014 - 09/14/2014 Chemotherapy      Maintenance Revlimid restarted      09/14/2014 - 09/17/2014 Hospital Admission    Admitted for pneumonia after coming back from a trip in [name]. Revlimid was held again.      11/13/2014 - 12/25/2015 Chemotherapy    Maintenance Revlimid restarted, changed to 2.'5mg'$  daily, 2 weks on, 1 week off from 12/15/2014, stopped due to disease progression       01/24/2016 Progression    Patient is M protein has gradually increased to 1.0g, repeated a bone marrow biopsy showed plasma cell 10-30%      02/27/2016 -  Chemotherapy    Pomalidomide 4 mg daily on day 1-21, dexamethasone 40 mg on day 1, 8 and 15, every 28 days, started on 02/27/2016, daratumumab weekly started on 03/07/2016, held after first dose, due to hospitalization and a severe pancytopenia       03/10/2016 - 03/29/2016 Hospital Admission    Pt was admitted for sepsis from pneumonia, and severe pancytopenia. She required ICU stay for a few days due to hypotension, developed b/l pleural effusion required diuretics and b/l thoracentesis. She also required blood and plt transfusion, and prolonged neupogen injection for severe neutropenia. She was discharged home after 3 weeks hospital stay       04/26/2016 -  Chemotherapy    Daratumumab restarted on 04/26/2016, Velcade 1.'3mg'$ /m2 and dexa '20mg'$  weekly start on 11/3      INTERVAL HISTORY:  Shannon Rush 75 y.o. female with a past medical history of MM as detailed above, s/p SCT is here for follow-up.   Stephie is doing well overall. She tolerated dexamethasone and Velcade injection and daratumumab together very well last week, had a few episodes of loose bowel movement, resolved. No significant fatigue, nausea, infusion retraction, or other complaints. No fever or chills, she has good appetite and energy level, she biked to the cancer center this morning.  MEDICAL HISTORY: Past Medical History:  Diagnosis Date  . CAD (coronary artery disease)    a. inf STEMI (in [place] >>> [facility]) >>> LHC (8/15- [place]):  pRCA 60%, mid to dist RCA 50% >>> med rx  . Cord compression ([place]) 07/13/12   MRI- diffuse myeloma involvement of T-L spine  . History of radiation therapy 07/13/12-07/27/12   spinal cord compression T3-T10,left scapula  . Hx of echocardiogram    Echo (03/02/14-done at [facility]):  Normal LVEF without significant regional wall motion abnormalities. Mild   . Multiple myeloma ([place]) 07/01/2012  . OSTEOPOROSIS 06/11/2010   Multiple compression fractures; and spontaneous fracture of sternum Qualifier: Diagnosis of  By: [name], [name]     . Thoracic kyphosis 07/13/12   per MRI scan  . Unspecified deficiency anemia      ALLERGIES:  is allergic to zosyn [piperacillin sod-tazobactam so];  other; piperacillin-tazobactam in dex; zithromax [azithromycin]; and ciprofloxacin.  MEDICATIONS: has a current medication list which includes the following prescription(s): acetaminophen, acyclovir, atorvastatin, calcium carbonate, clopidogrel, dexamethasone, fluticasone, metoprolol succinate, nitroglycerin, ondansetron, proair hfa, and prochlorperazine, and the following Facility-Administered Medications: sodium chloride, sodium chloride, acetaminophen, acetaminophen, furosemide, furosemide, heparin lock flush, heparin lock flush, heparin lock flush, heparin lock flush, sodium chloride flush, sodium chloride flush, sodium chloride flush, sodium chloride flush, and sodium chloride flush.  SURGICAL HISTORY:  Past Surgical History:  Procedure Laterality Date  . APPENDECTOMY    . CESAREAN SECTION     x2   . COLONOSCOPY  2007   neg with Dr. [name]  .  ELBOW SURGERY    . HIP SURGERY  2009   left  . TUBAL LIGATION      REVIEW OF SYSTEMS:   Constitutional: Denies abnormal weight loss Eyes: Denies blurriness of vision Ears, nose, mouth, throat, and face: Denies mucositis or sore throat,  Respiratory: Denies dyspnea or wheezes (+) mild productive cough Cardiovascular: Denies palpitation, chest discomfort (+) mild lower extremity swelling Gastrointestinal:  Denies nausea, heartburn or change in bowel habits Skin: Denies abnormal skin rashes Lymphatics: Denies new lymphadenopathy or easy bruising Neurological:Denies numbness, tingling or new weaknesses Behavioral/Psych: Mood is stable, no new changes  All other systems were reviewed with the patient and are negative.   PHYSICAL EXAMINATION: ECOG PERFORMANCE STATUS: 1 Blood pressure 124/64, pulse 100, temperature 98.1 F (36.7 C), temperature source Oral, resp. rate 18, height _0  (1.676 m), weight 122 lb 11.2 oz (55.7 kg), SpO2 100 %. GENERAL:alert, no distress and comfortable; easily mobile to exam table; kyphosis, moderate.  SKIN: skin  color, texture, turgor are normal, no rashes or significant lesions; Abrasion well healed on RLE.  EYES: normal, Conjunctiva are pink, 1 tear/injectionof blood vessel left eye in sclera. OROPHARYNX:no exudate, no erythema and lips, buccal mucosa, and tongue normal, no ONJ NECK: supple, thyroid normal size, non-tender, without nodularity; nodules cervical bilaterally.  LYMPH:  no palpable lymphadenopathy in the cervical, axillary or supraclavicular LUNGS: crackles at the bases bilaterally with normal breathing effort, no wheezes or rhonchi HEART: regular rate & rhythm and no murmurs (+) trace lower extremity edema ABDOMEN:abdomen soft, non-tender and normal bowel sounds Musculoskeletal:no cyanosis of digits and no clubbing, (+) spine scoliosis and mild tenderness  NEURO: alert & oriented x 3 with fluent speech, no focal motor/sensory deficits   Labs:  CBC Latest Ref Rng & Units 05/31/2016 05/23/2016 05/17/2016  WBC 3.9 - 10.3 10e3/uL 3.1(L) 3.3(L) 3.0(L)  Hemoglobin 11.6 - 15.9 g/dL 10.5(L) 10.0(L) 9.9(L)  Hematocrit 34.8 - 46.6 % 31.9(L) 30.2(L) 29.9(L)  Platelets 145 - 400 10e3/uL 140(L) 140(L) 152    CMP Latest Ref Rng & Units 05/23/2016 05/17/2016 05/10/2016  Glucose 70 - 140 mg/dl 103 68(L) 108  BUN 7.0 - 26.0 mg/dL 13.8 15.3 13.8  Creatinine 0.6 - 1.1 mg/dL 0.7 0.7 0.7  Sodium 136 - 145 mEq/L 140 141 139  Potassium 3.5 - 5.1 mEq/L 3.4(L) 3.6 3.7  Chloride 101 - 111 mmol/L - - -  CO2 22 - 29 mEq/L 22 25 21(L)  Calcium 8.4 - 10.4 mg/dL 8.9 8.6 8.9  Total Protein 6.4 - 8.3 g/dL 6.1(L) 6.1(L) 6.7  Total Bilirubin 0.20 - 1.20 mg/dL 0.87 0.85 0.83  Alkaline Phos 40 - 150 U/L 69 77 91  AST 5 - 34 U/L _1 ALT 0 - 55 U/L _2 ANC 2.6K  SPEP M-PROTEIN 10/01/2013: 0.18 (nadir)  10/13/2014: 0.6 11/21/2014: 0.5 01/30/2015: 0.6 03/13/2015: 0.6 04/20/2015: 0.5 06/01/2015: 0.5 07/20/2015: 0.7 09/14/2015: 0.7 10/26/2015: 0.8 12/07/2015: 1.0 02/07/2016: 1.1  04/03/2016: 0.2 05/03/2016:  0.2   IgG [phone]) 10/13/2014: 1710 11/21/2014: 1460 01/30/2015: 1380 03/13/15: 1570 04/20/2015: 1550 06/01/2015: 1590 07/22/2015: 1770 09/14/2015: 1787 10/26/2015: 1898 12/07/2015: 2045 02/07/2016: 1883 04/03/2016: 787 05/03/2016: 685   Serum kappa, lambda light chains, and ratio  11/21/2014: 3.95, 2.08, 1.90  01/30/2015: 3.92, 2.64, 1.48 03/13/2015: 3.55, 2.17, 1.64 04/20/15: 4.52, 1.78, 2.54 06/01/2015: 4.82, 1.75, 2.75 07/20/2015: 8.71, 2.43, 3.58 09/14/2015: 9.17, 2.02, 4.55 10/26/2015: 11.4, 1.75, 6.42 12/07/2015: 1.54, 1.83, 8.44 02/07/2016: 1.93, 1.42, 13.6 04/03/2016: 0.96, 0.95,  1.01  05/03/2016: 0.71, 0.34, 2.09   24 hr urine PEP (total protein, M-spike) 10/13/2014: (-) 02/12/2016: 286m, 659m9/22/2017: 114 mg, (-)  PATHOLOGY REPORT  Diagnosis 01/24/2016 Bone Marrow, Aspirate,Biopsy, and Clot, RT iliac and core - VARIABLY CELLULAR BONE MARROW WITH PLASMA CELL NEOPLASM. - SEE COMMENT. PERIPHERAL BLOOD: - MACROCYTIC ANEMIA. Diagnosis Note The bone marrow is variably cellular with trilineage hematopoiesis and non specific myeloid changes. In this background, the plasma cell component is variable ranging from less than 10 % in many areas to 30% in some aspirate particles with relatively low cellularity. Foci of interstitial infiltrates and small clusters are also seen in the clot section. Immunohistochemical stains show kappa light chain restriction in atypical plasma cell clusters and infiltrates consistent with plasma cell neoplasm. Correlation with cytogenetic and FISH studies is recommended. (BNS:gt, 01/19/02/17  RADIOGRAPHIC STUDIES: None  ASSESSMENT: ClPEIGHTON MEHRA89.o. female with a history of Multiple Myeloma s/p autotransplant in August 2014 followed by maintenence low dose Revlimid therapy, and relapsed in 01/2016  PLAN:   1. IgG kappa Multiple myeloma s/p Auto SCT, intermediate risk, relapsed in 01/2016 --MM markers demonstrate a very good initial response (M protein  baseline 2.6, down to 0.18 after induction chemo and transplant, stable around 0.5 since July 2015, but has gradually increased lately),  she unfortunately relapsed in July 2017 -I discussed her repeated bone marrow biopsy from 02/01/2016, which showed 10-30% plasma cells in her marrow -I agree with Dr. GaKendell Baneecommendation to stop revlimid and change to Daratumumab, Pomalidomide and dexamethasone due to her disease relapse. I don't think she has resistance to Revlimid, she has been on very small dose of Revlimid due to her prior history of multiple infections. -She started first cycle DaraPD in 02/2016, but in 2 weeks she developed severe pancytopenia, pneumonia with sepsis required 3 weeks hospital stay. She did have a very nice response to the treatment, her M-protein has significantly reduced from 1.2 to 0.2, light chain levels normalized even with half cycle treatment  -I recommended her to change regiment to DaraVD, and started her on dara every 2 weeks for 2 doses, she tolerated this very well -lab reviewed, will proceed full regimen weekly Dara, Velcade and dexa [id number]  -We'll monitor her labs closely  2. Pancytopenia  -secondary to chemo  -Mild leukopenia, no neutropenia, mild anemia stable, platelet to 140 K today.  -Lab weekly  3. CHF, CAD, inferior STEMI in 02/2014 -continue plavix, metoprolol and lipitor  -follow up with Dr. NisJohnsie Cancelher recent echo showed EF 45-50% -her b/l pleural effusion much improved after lasix and thoracentesis, she is off lasix now   4. Osteoporosis -recently repeated bone density scan showed worsening osteoporosis, continue zometa, changed to every 3 months after 03/2015.  -she declined repeating DEXA  -continue Zometa every 3 months, next due in Jan 2018    Plan -will proceed 5th dose daratumumab today, along with second week Velcade and dexa [id number] continue weekly -No treatment next week due to Thanksgiving holiday and her travel -She  will return in 2 weeks for lab, follow-up and treatment.  All questions were answered. The patient knows to call the clinic with any problems, questions or concerns. We can certainly see the patient much sooner if necessary.  I spent 15 minutes counseling the patient face to face. The total time spent in the appointment was 20 minutes.    FengTruitt Merle/17/2017

## 2016-05-31 ENCOUNTER — Encounter: Payer: Self-pay | Admitting: Hematology

## 2016-05-31 ENCOUNTER — Other Ambulatory Visit (HOSPITAL_BASED_OUTPATIENT_CLINIC_OR_DEPARTMENT_OTHER): Payer: Medicare Other

## 2016-05-31 ENCOUNTER — Ambulatory Visit (HOSPITAL_BASED_OUTPATIENT_CLINIC_OR_DEPARTMENT_OTHER): Payer: Medicare Other | Admitting: Hematology

## 2016-05-31 ENCOUNTER — Ambulatory Visit (HOSPITAL_BASED_OUTPATIENT_CLINIC_OR_DEPARTMENT_OTHER): Payer: Medicare Other

## 2016-05-31 VITALS — BP 124/64 | HR 100 | Temp 98.1°F | Resp 18 | Ht 66.0 in | Wt 122.7 lb

## 2016-05-31 VITALS — BP 96/51 | HR 78 | Temp 98.6°F | Resp 16

## 2016-05-31 DIAGNOSIS — C9000 Multiple myeloma not having achieved remission: Secondary | ICD-10-CM

## 2016-05-31 DIAGNOSIS — I252 Old myocardial infarction: Secondary | ICD-10-CM

## 2016-05-31 DIAGNOSIS — D61818 Other pancytopenia: Secondary | ICD-10-CM | POA: Diagnosis not present

## 2016-05-31 DIAGNOSIS — C9002 Multiple myeloma in relapse: Secondary | ICD-10-CM

## 2016-05-31 DIAGNOSIS — T451X5A Adverse effect of antineoplastic and immunosuppressive drugs, initial encounter: Secondary | ICD-10-CM

## 2016-05-31 DIAGNOSIS — M818 Other osteoporosis without current pathological fracture: Secondary | ICD-10-CM

## 2016-05-31 DIAGNOSIS — I5022 Chronic systolic (congestive) heart failure: Secondary | ICD-10-CM

## 2016-05-31 DIAGNOSIS — Z5112 Encounter for antineoplastic immunotherapy: Secondary | ICD-10-CM | POA: Diagnosis not present

## 2016-05-31 DIAGNOSIS — D61810 Antineoplastic chemotherapy induced pancytopenia: Secondary | ICD-10-CM | POA: Diagnosis not present

## 2016-05-31 DIAGNOSIS — M810 Age-related osteoporosis without current pathological fracture: Secondary | ICD-10-CM

## 2016-05-31 DIAGNOSIS — D6481 Anemia due to antineoplastic chemotherapy: Secondary | ICD-10-CM

## 2016-05-31 LAB — CBC WITH DIFFERENTIAL/PLATELET
BASO%: 1.3 % (ref 0.0–2.0)
BASOS ABS: 0 10*3/uL (ref 0.0–0.1)
EOS ABS: 0 10*3/uL (ref 0.0–0.5)
EOS%: 1.4 % (ref 0.0–7.0)
HEMATOCRIT: 31.9 % — AB (ref 34.8–46.6)
HGB: 10.5 g/dL — ABNORMAL LOW (ref 11.6–15.9)
LYMPH#: 0.4 10*3/uL — AB (ref 0.9–3.3)
LYMPH%: 11.6 % — ABNORMAL LOW (ref 14.0–49.7)
MCH: 36 pg — AB (ref 25.1–34.0)
MCHC: 32.9 g/dL (ref 31.5–36.0)
MCV: 109.5 fL — ABNORMAL HIGH (ref 79.5–101.0)
MONO#: 0.2 10*3/uL (ref 0.1–0.9)
MONO%: 5.3 % (ref 0.0–14.0)
NEUT#: 2.5 10*3/uL (ref 1.5–6.5)
NEUT%: 80.4 % — AB (ref 38.4–76.8)
PLATELETS: 140 10*3/uL — AB (ref 145–400)
RBC: 2.91 10*6/uL — ABNORMAL LOW (ref 3.70–5.45)
RDW: 22.2 % — ABNORMAL HIGH (ref 11.2–14.5)
WBC: 3.1 10*3/uL — ABNORMAL LOW (ref 3.9–10.3)

## 2016-05-31 LAB — COMPREHENSIVE METABOLIC PANEL
ALK PHOS: 91 U/L (ref 40–150)
ALT: 21 U/L (ref 0–55)
ANION GAP: 12 meq/L — AB (ref 3–11)
AST: 20 U/L (ref 5–34)
Albumin: 3.9 g/dL (ref 3.5–5.0)
BUN: 14.6 mg/dL (ref 7.0–26.0)
CALCIUM: 9.4 mg/dL (ref 8.4–10.4)
CHLORIDE: 108 meq/L (ref 98–109)
CO2: 21 mEq/L — ABNORMAL LOW (ref 22–29)
CREATININE: 0.7 mg/dL (ref 0.6–1.1)
EGFR: 84 mL/min/{1.73_m2} — ABNORMAL LOW (ref >90)
Glucose: 97 mg/dl (ref 70–140)
POTASSIUM: 3.3 meq/L — AB (ref 3.5–5.1)
Sodium: 141 mEq/L (ref 136–145)
Total Bilirubin: 0.94 mg/dL (ref 0.20–1.20)
Total Protein: 6.9 g/dL (ref 6.4–8.3)

## 2016-05-31 MED ORDER — PROCHLORPERAZINE MALEATE 10 MG PO TABS
10.0000 mg | ORAL_TABLET | Freq: Once | ORAL | Status: AC
  Administered 2016-05-31: 10 mg via ORAL

## 2016-05-31 MED ORDER — MONTELUKAST SODIUM 10 MG PO TABS
10.0000 mg | ORAL_TABLET | Freq: Once | ORAL | Status: AC
  Administered 2016-05-31: 10 mg via ORAL
  Filled 2016-05-31: qty 1

## 2016-05-31 MED ORDER — SODIUM CHLORIDE 0.9 % IV SOLN
Freq: Once | INTRAVENOUS | Status: AC
  Administered 2016-05-31: 10:00:00 via INTRAVENOUS

## 2016-05-31 MED ORDER — SODIUM CHLORIDE 0.9 % IV SOLN
16.0000 mg/kg | Freq: Once | INTRAVENOUS | Status: AC
  Administered 2016-05-31: 900 mg via INTRAVENOUS
  Filled 2016-05-31: qty 40

## 2016-05-31 MED ORDER — DIPHENHYDRAMINE HCL 25 MG PO CAPS
ORAL_CAPSULE | ORAL | Status: AC
  Filled 2016-05-31: qty 2

## 2016-05-31 MED ORDER — BORTEZOMIB CHEMO SQ INJECTION 3.5 MG (2.5MG/ML)
1.3000 mg/m2 | Freq: Once | INTRAMUSCULAR | Status: AC
  Administered 2016-05-31: 2 mg via SUBCUTANEOUS
  Filled 2016-05-31: qty 2

## 2016-05-31 MED ORDER — METHYLPREDNISOLONE SODIUM SUCC 125 MG IJ SOLR
125.0000 mg | Freq: Once | INTRAMUSCULAR | Status: AC
  Administered 2016-05-31: 125 mg via INTRAVENOUS

## 2016-05-31 MED ORDER — DIPHENHYDRAMINE HCL 25 MG PO CAPS
50.0000 mg | ORAL_CAPSULE | Freq: Once | ORAL | Status: AC
  Administered 2016-05-31: 50 mg via ORAL

## 2016-05-31 MED ORDER — ACETAMINOPHEN 325 MG PO TABS
650.0000 mg | ORAL_TABLET | Freq: Once | ORAL | Status: AC
  Administered 2016-05-31: 650 mg via ORAL

## 2016-05-31 MED ORDER — ACETAMINOPHEN 325 MG PO TABS
ORAL_TABLET | ORAL | Status: AC
  Filled 2016-05-31: qty 2

## 2016-05-31 MED ORDER — PROCHLORPERAZINE MALEATE 10 MG PO TABS
ORAL_TABLET | ORAL | Status: AC
  Filled 2016-05-31: qty 1

## 2016-05-31 MED ORDER — METHYLPREDNISOLONE SODIUM SUCC 125 MG IJ SOLR
INTRAMUSCULAR | Status: AC
Start: 2016-05-31 — End: 2016-05-31
  Filled 2016-05-31: qty 2

## 2016-05-31 NOTE — Patient Instructions (Signed)
[facility] Discharge Instructions for Patients Receiving Chemotherapy  Today you received the following chemotherapy agents Darzalex and Velcade   To help prevent nausea and vomiting after your treatment, we encourage you to take your nausea medication as directed.    If you develop nausea and vomiting that is not controlled by your nausea medication, call the clinic.   BELOW ARE SYMPTOMS THAT SHOULD BE REPORTED IMMEDIATELY:  *FEVER GREATER THAN 100.5 F  *CHILLS WITH OR WITHOUT FEVER  NAUSEA AND VOMITING THAT IS NOT CONTROLLED WITH YOUR NAUSEA MEDICATION  *UNUSUAL SHORTNESS OF BREATH  *UNUSUAL BRUISING OR BLEEDING  TENDERNESS IN MOUTH AND THROAT WITH OR WITHOUT PRESENCE OF ULCERS  *URINARY PROBLEMS  *BOWEL PROBLEMS  UNUSUAL RASH Items with * indicate a potential emergency and should be followed up as soon as possible.  Feel free to call the clinic you have any questions or concerns. The clinic phone number is [phone].  Please show the CHEMO ALERT CARD at check-in to the Emergency Department and triage nurse.   

## 2016-06-03 ENCOUNTER — Telehealth: Payer: Self-pay | Admitting: General Practice

## 2016-06-03 LAB — KAPPA/LAMBDA LIGHT CHAINS
IG KAPPA FREE LIGHT CHAIN: 4.8 mg/L (ref 3.3–19.4)
IG LAMBDA FREE LIGHT CHAIN: 2.6 mg/L — AB (ref 5.7–26.3)
KAPPA/LAMBDA FLC RATIO: 1.85 — AB (ref 0.26–1.65)

## 2016-06-03 NOTE — Telephone Encounter (Signed)
Left msg regarding 06/21/16 appt.

## 2016-06-04 LAB — MULTIPLE MYELOMA PANEL, SERUM
Albumin SerPl Elph-Mcnc: 3.7 g/dL (ref 2.9–4.4)
Albumin/Glob SerPl: 1.7 (ref 0.7–1.7)
Alpha 1: 0.3 g/dL (ref 0.0–0.4)
Alpha2 Glob SerPl Elph-Mcnc: 0.6 g/dL (ref 0.4–1.0)
B-GLOBULIN SERPL ELPH-MCNC: 0.8 g/dL (ref 0.7–1.3)
Gamma Glob SerPl Elph-Mcnc: 0.5 g/dL (ref 0.4–1.8)
Globulin, Total: 2.2 g/dL (ref 2.2–3.9)
IGA/IMMUNOGLOBULIN A, SERUM: 9 mg/dL — AB (ref 64–422)
IgG, Qn, Serum: 544 mg/dL — ABNORMAL LOW (ref 700–1600)
IgM, Qn, Serum: 13 mg/dL — ABNORMAL LOW (ref 26–217)
M PROTEIN SERPL ELPH-MCNC: 0.1 g/dL — AB
TOTAL PROTEIN: 5.9 g/dL — AB (ref 6.0–8.5)

## 2016-06-13 NOTE — Progress Notes (Signed)
[facility] ONCOLOGY OFFICE PROGRESS NOTE   Shannon ARTHUR, MD [address] [place] [address]  DIAGNOSIS: Multiple myeloma with relapse  CHIEF COMPLAINS: Follow-up of multiple myeloma  CURRENT TREATMENT:   1. Pomalidomide 4 mg daily on day 1-21, dexamethasone 40 mg on day 1, 8 and 15, every 28 days, started on 02/27/2016, daratumumab weekly started on 03/07/2016, treatment on hold since 03/09/2016 due to hospitalization and severe cytopenia 2. Daratumumab restarted on 04/26/2016, Velcade 1.3mg/m2 and dexa 20mg weekly start on 11/3 2. Zometa 3.5 mg monthly started on 06/2013, on every 3 months now, last 05/03/2016   Oncology History   Multiple myeloma (HCC)   Staging form: Multiple Myeloma, AJCC 6th Edition   - Clinical: Stage IIIA - Signed by Shannon Chism, MD on 09/04/2013      Multiple myeloma (HCC)   05/26/2012 Initial Diagnosis    Presenting IgG was 4,040 mg/dL on 06/05/2012 (IgA 35; IgM 34); kappa free light chain 34.4 mg/dL, lambda 0.00, kappa:lambda ratio of 34.75; SPEP with M-spike of 2.60.      06/30/2012 Imaging    Numerous lytic lesions throughout the calvarium.  Lytic lesion within the left lateral scapula.  Findings most compatible with metastases or myeloma. Multiple mid and lower thoracic compression fractures.  Slight compression through the endplates at L3.      07/03/2012 Bone Marrow Biopsy    Bone marrow biopsy showed 49% plasma cell. Normal classical cytogenetics; however, myeloma FISH panel showed 13q- (intermediate risk)          07/14/2012 - 07/27/2012 Radiation Therapy    Palliative radiation 20 Gy over 10 fractions between to thoracic spine cord compression and symptomatic left scapula lytic lesion.      08/10/2012 -  Chemotherapy    Started SQ Velcade once weekly, 3 weeks on, 1 weeks off; daily Revlimid d1-21, 7 days off; and Dexamethasone 40mg PO weekly.       02/12/2013 Bone Marrow Transplant    Auto bone marrow  transplant at [facility]        06/14/2013 Tumor Marker    IgG  1830,  Kappa:lamba ratio, 1.69 (baseline was 34.75 or   M-spike 0.28 (baseline of 2.6 or  89.3% of baseline).        06/25/2013 -  Chemotherapy    Started zometa 3.5 mg monthly       09/01/2013 -  Chemotherapy    Maintenance therapy with revlimid 5mg daily for 14 days and then off for 7 (decreased from 21 days on and 7 days off based on neutropenia).       09/13/2013 - 09/17/2014 Hospital Admission    Hospital admission for pneumonia      12/20/2013 Treatment Plan Change    Maintenance therapy decreased to 2.5 mg daily for 21 days and then off for 7 days based on low counts.       01/25/2014 Tumor Marker    IgG 1360 M spike 0.5      03/01/2014 - 03/05/2014 Hospital Admission    [facility] with an inferior STEMI, received thrombolytic therapy, cath showed 60% prox RCA, mid-distal RCA 50%, Echo normal, no stents.      04/11/2014 - 08/07/2014 Chemotherapy    Continue Revlimid at 2.5 mg 3 weeks on 1 week off      08/08/2014 - 08/13/2014 Hospital Admission    Hospital admission for pneumonia. Her Revlimid was held.      08/29/2014 - 09/14/2014 Chemotherapy      Maintenance Revlimid restarted      09/14/2014 - 09/17/2014 Hospital Admission    Admitted for pneumonia after coming back from a trip in [name]. Revlimid was held again.      11/13/2014 - 12/25/2015 Chemotherapy    Maintenance Revlimid restarted, changed to 2.61m daily, 2 weks on, 1 week off from 12/15/2014, stopped due to disease progression       01/24/2016 Progression    Patient is M protein has gradually increased to 1.0g, repeated a bone marrow biopsy showed plasma cell 10-30%      02/27/2016 -  Chemotherapy    Pomalidomide 4 mg daily on day 1-21, dexamethasone 40 mg on day 1, 8 and 15, every 28 days, started on 02/27/2016, daratumumab weekly started on 03/07/2016, held after first dose, due to hospitalization and a severe pancytopenia       03/10/2016 - 03/29/2016 Hospital Admission    Pt was admitted for sepsis from pneumonia, and severe pancytopenia. She required ICU stay for a few days due to hypotension, developed b/l pleural effusion required diuretics and b/l thoracentesis. She also required blood and plt transfusion, and prolonged neupogen injection for severe neutropenia. She was discharged home after 3 weeks hospital stay       04/26/2016 -  Chemotherapy    Daratumumab restarted on 04/26/2016, Velcade 1.363mm2 and dexa 2076meekly start on 11/3      INTERVAL HISTORY:  Shannon Rush 75o. female with a past medical history of MM as detailed above, s/p SCT is here for follow-up. She did not have any adverse reaction with her last treatment. She rode her bike to the clinic this morning. She continues to teach school. She has no concerns at this time. She does not need any refills at this time. She hasn't noticed any side effects while taking steroids. She denies heartburn.    MEDICAL HISTORY: Past Medical History:  Diagnosis Date  . CAD (coronary artery disease)    a. [place] >>> [facility] (8/15- [facility] pRCA 60%, mid to dist RCA 50% >>> med rx  . [place]   MRI- diffuse myeloma involvement of T-L spine  . History of radiation therapy 07/13/12-07/27/12   spinal cord compression T3-T10,left scapula  . Hx of echocardiogram    [facility] Normal LVEF without significant regional wall motion abnormalities. Mild   . Multiple [place]oma ([facility]  . OSTEOPOROSIS 06/11/2010   Multiple compression fractures; and spontaneous fracture of sternum Qualifier: Diagnosis of  By: Shannon Rush, Shannon Rush  . Thoracic kyphosis 07/13/12   per MRI scan  . Unspecified deficiency anemia      ALLERGIES:  is allergic to zosyn [piperacillin sod-tazobactam so]; other; piperacillin-tazobactam in dex; zithromax  [azithromycin]; and ciprofloxacin.  MEDICATIONS: has a current medication list which includes the following prescription(s): acetaminophen, acyclovir, atorvastatin, calcium carbonate, clopidogrel, dexamethasone, fluticasone, metoprolol succinate, nitroglycerin, ondansetron, proair hfa, and prochlorperazine, and the following Facility-Administered Medications: sodium chloride, sodium chloride, acetaminophen, acetaminophen, furosemide, furosemide, heparin lock flush, heparin lock flush, heparin lock flush, heparin lock flush, sodium chloride flush, sodium chloride flush, sodium chloride flush, sodium chloride flush, and sodium chloride flush.  SURGICAL HISTORY:  Past Surgical History:  Procedure Laterality Date  . APPENDECTOMY    . CESAREAN SECTION     x2   . COLONOSCOPY  2007   neg with Dr. MagWatt Climes ELBOW SURGERY    .  HIP SURGERY  2009   left  . TUBAL LIGATION      REVIEW OF SYSTEMS:   Constitutional: Denies abnormal weight loss Eyes: Denies blurriness of vision Ears, nose, mouth, throat, and face: Denies mucositis or sore throat,  Respiratory: Denies dyspnea or wheezes  Cardiovascular: Denies palpitation, chest discomfort  Gastrointestinal:  Denies nausea, heartburn or change in bowel habits Skin: Denies abnormal skin rashes Lymphatics: Denies new lymphadenopathy or easy bruising Neurological:Denies numbness, tingling or new weaknesses Behavioral/Psych: Mood is stable, no new changes  All other systems were reviewed with the patient and are negative.   PHYSICAL EXAMINATION: ECOG PERFORMANCE STATUS: 1 Blood pressure (!) 106/57, pulse 85, temperature 97.8 F (36.6 C), temperature source Oral, resp. rate 18, height _0  (1.676 m), weight 122 lb 8 oz (55.6 kg), SpO2 96 %. GENERAL:alert, no distress and comfortable; easily mobile to exam table; kyphosis, moderate.  SKIN: skin color, texture, turgor are normal, no rashes or significant lesions; Abrasion well healed on RLE.  EYES:  normal, Conjunctiva are pink, 1 tear/injectionof blood vessel left eye in sclera. OROPHARYNX:no exudate, no erythema and lips, buccal mucosa, and tongue normal, no ONJ NECK: supple, thyroid normal size, non-tender, without nodularity; nodules cervical bilaterally.  LYMPH:  no palpable lymphadenopathy in the cervical, axillary or supraclavicular LUNGS: crackles at the bases bilaterally with normal breathing effort, no wheezes or rhonchi HEART: regular rate & rhythm and no murmurs  ABDOMEN:abdomen soft, non-tender and normal bowel sounds Musculoskeletal:no cyanosis of digits and no clubbing, (+) spine scoliosis  NEURO: alert & oriented x 3 with fluent speech, no focal motor/sensory deficits   Labs:  CBC Latest Ref Rng & Units 06/14/2016 05/31/2016 05/23/2016  WBC 3.9 - 10.3 10e3/uL 3.8(L) 3.1(L) 3.3(L)  Hemoglobin 11.6 - 15.9 g/dL 10.1(L) 10.5(L) 10.0(L)  Hematocrit 34.8 - 46.6 % 31.0(L) 31.9(L) 30.2(L)  Platelets 145 - 400 10e3/uL 155 140(L) 140(L)    CMP Latest Ref Rng & Units 05/31/2016 05/31/2016 05/23/2016  Glucose 70 - 140 mg/dl 97 - 103  BUN 7.0 - 26.0 mg/dL 14.6 - 13.8  Creatinine 0.6 - 1.1 mg/dL 0.7 - 0.7  Sodium 136 - 145 mEq/L 141 - 140  Potassium 3.5 - 5.1 mEq/L 3.3(L) - 3.4(L)  Chloride 101 - 111 mmol/L - - -  CO2 22 - 29 mEq/L 21(L) - 22  Calcium 8.4 - 10.4 mg/dL 9.4 - 8.9  Total Protein 6.4 - 8.3 g/dL 6.9 5.9(L) 6.1(L)  Total Bilirubin 0.20 - 1.20 mg/dL 0.94 - 0.87  Alkaline Phos 40 - 150 U/L 91 - 69  AST 5 - 34 U/L 20 - 15  ALT 0 - 55 U/L 21 - 13   ANC 2.6K  SPEP M-PROTEIN 10/01/2013: 0.18 (nadir)  10/13/2014: 0.6 11/21/2014: 0.5 01/30/2015: 0.6 03/13/2015: 0.6 04/20/2015: 0.5 06/01/2015: 0.5 07/20/2015: 0.7 09/14/2015: 0.7 10/26/2015: 0.8 12/07/2015: 1.0 02/07/2016: 1.1  04/03/2016: 0.2 05/03/2016: 0.2   IgG ([phone]) 10/13/2014: 1710 11/21/2014: 1460 01/30/2015: 1380 03/13/15: 1570 04/20/2015: 1550 06/01/2015: 1590 07/22/2015: 1770 09/14/2015: 1787 10/26/2015:  1898 12/07/2015: 2045 02/07/2016: 1883 04/03/2016: 787 05/03/2016: 685   Serum kappa, lambda light chains, and ratio  11/21/2014: 3.95, 2.08, 1.90  01/30/2015: 3.92, 2.64, 1.48 03/13/2015: 3.55, 2.17, 1.64 04/20/15: 4.52, 1.78, 2.54 06/01/2015: 4.82, 1.75, 2.75 07/20/2015: 8.71, 2.43, 3.58 09/14/2015: 9.17, 2.02, 4.55 10/26/2015: 11.4, 1.75, 6.42 12/07/2015: 1.54, 1.83, 8.44 02/07/2016: 1.93, 1.42, 13.6 04/03/2016: 0.96, 0.95, 1.01  05/03/2016: 0.71, 0.34, 2.09   24 hr urine PEP (total protein, M-spike) 10/13/2014: (-) 02/12/2016: 266m,  $'68mg'L$  04/05/2016: 114 mg, (-)  PATHOLOGY REPORT  Diagnosis 01/24/2016 Bone Marrow, Aspirate,Biopsy, and Clot, RT iliac and core - VARIABLY CELLULAR BONE MARROW WITH PLASMA CELL NEOPLASM. - SEE COMMENT. PERIPHERAL BLOOD: - MACROCYTIC ANEMIA. Diagnosis Note The bone marrow is variably cellular with trilineage hematopoiesis and non specific myeloid changes. In this background, the plasma cell component is variable ranging from less than 10 % in many areas to 30% in some aspirate particles with relatively low cellularity. Foci of interstitial infiltrates and small clusters are also seen in the clot section. Immunohistochemical stains show kappa light chain restriction in atypical plasma cell clusters and infiltrates consistent with plasma cell neoplasm. Correlation with cytogenetic and FISH studies is recommended. (BNS:gt, 2016-02-11)   RADIOGRAPHIC STUDIES: None  ASSESSMENT: Shannon Rush 75 y.o. female with a history of Multiple Myeloma s/p autotransplant in August 2014 followed by maintenence low dose Revlimid therapy, and relapsed in 01/2016  PLAN:   1. IgG kappa Multiple myeloma s/p Auto SCT, intermediate risk, relapsed in 01/2016 --MM markers demonstrate a very good initial response (M protein baseline 2.6, down to 0.18 after induction chemo and transplant, stable around 0.5 since July 2015, but has gradually increased lately),  she unfortunately relapsed in  July 2017 -I previously discussed her repeated bone marrow biopsy from 02/01/2016, which showed 10-30% plasma cells in her marrow -I agree with Dr. Kendell Bane recommendation to stop revlimid and change to Daratumumab, Pomalidomide and dexamethasone due to her disease relapse. I don't think she has resistance to Revlimid, she has been on very small dose of Revlimid due to her prior history of multiple infections. -She started first cycle DaraPD in 02/2016, but in 2 weeks she developed severe pancytopenia, pneumonia with sepsis required 3 weeks hospital stay. She did have a very nice response to the treatment, her M-protein has significantly reduced from 1.2 to 0.2, light chain levels normalized even with half cycle treatment  -I recommended her to change regiment to DaraVD, and started her on dara every 2 weeks for 2 doses, she tolerated this very well -lab reviewed, will proceed full regimen weekly Dara (cycle 6), Velcade and dexa '20mg'$  today  -We'll monitor her labs closely  2. Pancytopenia  -secondary to chemo  -Mild leukopenia, no neutropenia, mild anemia stable, platelet to 155 K today.  -Lab weekly  3. CHF, CAD, inferior STEMI in 02/2014 -continue plavix, metoprolol and lipitor  -follow up with Dr. Johnsie Cancel  -her recent echo showed EF 45-50% -her b/l pleural effusion much improved after lasix and thoracentesis, she is off lasix now   4. Osteoporosis -recently repeated bone density scan showed worsening osteoporosis, continue zometa, changed to every 3 months after 03/2015.  -she declined repeating DEXA  -continue Zometa every 3 months, next due in Jan 2018    Plan -will proceed 6th dose daratumumab today, along with fourth week Velcade and dexa '20mg'$ , and continue weekly -she does not need any refills at this time -Echocardiogram scheduled for 06/27/2016 -She will return in 2 weeks for lab, follow-up and treatment.   All questions were answered. The patient knows to call the clinic  with any problems, questions or concerns. We can certainly see the patient much sooner if necessary.  I spent 20 minutes counseling the patient face to face. The total time spent in the appointment was 25 minutes.  This document serves as a record of services personally performed by [name], MD. It was created on her behalf by Arlyce Harman, a trained medical  scribe. The creation of this record is based on the scribe's personal observations and the provider's statements to them. This document has been checked and approved by the attending provider.    [name]  06/14/2016

## 2016-06-14 ENCOUNTER — Encounter: Payer: Self-pay | Admitting: Hematology

## 2016-06-14 ENCOUNTER — Ambulatory Visit (HOSPITAL_BASED_OUTPATIENT_CLINIC_OR_DEPARTMENT_OTHER): Payer: Medicare Other

## 2016-06-14 ENCOUNTER — Other Ambulatory Visit (HOSPITAL_BASED_OUTPATIENT_CLINIC_OR_DEPARTMENT_OTHER): Payer: Medicare Other

## 2016-06-14 ENCOUNTER — Ambulatory Visit (HOSPITAL_BASED_OUTPATIENT_CLINIC_OR_DEPARTMENT_OTHER): Payer: Medicare Other | Admitting: Hematology

## 2016-06-14 VITALS — BP 93/49 | HR 78 | Temp 97.7°F | Resp 18

## 2016-06-14 VITALS — BP 106/57 | HR 85 | Temp 97.8°F | Resp 18 | Ht 66.0 in | Wt 122.5 lb

## 2016-06-14 DIAGNOSIS — M818 Other osteoporosis without current pathological fracture: Secondary | ICD-10-CM

## 2016-06-14 DIAGNOSIS — I252 Old myocardial infarction: Secondary | ICD-10-CM | POA: Diagnosis not present

## 2016-06-14 DIAGNOSIS — C9002 Multiple myeloma in relapse: Secondary | ICD-10-CM

## 2016-06-14 DIAGNOSIS — D6481 Anemia due to antineoplastic chemotherapy: Secondary | ICD-10-CM

## 2016-06-14 DIAGNOSIS — T451X5A Adverse effect of antineoplastic and immunosuppressive drugs, initial encounter: Secondary | ICD-10-CM

## 2016-06-14 DIAGNOSIS — I5022 Chronic systolic (congestive) heart failure: Secondary | ICD-10-CM

## 2016-06-14 DIAGNOSIS — D61810 Antineoplastic chemotherapy induced pancytopenia: Secondary | ICD-10-CM

## 2016-06-14 DIAGNOSIS — C9000 Multiple myeloma not having achieved remission: Secondary | ICD-10-CM

## 2016-06-14 DIAGNOSIS — Z5112 Encounter for antineoplastic immunotherapy: Secondary | ICD-10-CM

## 2016-06-14 LAB — CBC WITH DIFFERENTIAL/PLATELET
BASO%: 0.5 % (ref 0.0–2.0)
BASOS ABS: 0 10*3/uL (ref 0.0–0.1)
EOS%: 1.3 % (ref 0.0–7.0)
Eosinophils Absolute: 0.1 10*3/uL (ref 0.0–0.5)
HEMATOCRIT: 31 % — AB (ref 34.8–46.6)
HEMOGLOBIN: 10.1 g/dL — AB (ref 11.6–15.9)
LYMPH#: 0.5 10*3/uL — AB (ref 0.9–3.3)
LYMPH%: 12 % — ABNORMAL LOW (ref 14.0–49.7)
MCH: 35.7 pg — AB (ref 25.1–34.0)
MCHC: 32.6 g/dL (ref 31.5–36.0)
MCV: 109.5 fL — ABNORMAL HIGH (ref 79.5–101.0)
MONO#: 0.3 10*3/uL (ref 0.1–0.9)
MONO%: 6.5 % (ref 0.0–14.0)
NEUT#: 3.1 10*3/uL (ref 1.5–6.5)
NEUT%: 79.7 % — AB (ref 38.4–76.8)
NRBC: 0 % (ref 0–0)
Platelets: 155 10*3/uL (ref 145–400)
RBC: 2.83 10*6/uL — ABNORMAL LOW (ref 3.70–5.45)
RDW: 18.4 % — AB (ref 11.2–14.5)
WBC: 3.8 10*3/uL — ABNORMAL LOW (ref 3.9–10.3)

## 2016-06-14 LAB — COMPREHENSIVE METABOLIC PANEL
ALBUMIN: 3.6 g/dL (ref 3.5–5.0)
ALK PHOS: 160 U/L — AB (ref 40–150)
ALT: 12 U/L (ref 0–55)
AST: 15 U/L (ref 5–34)
Anion Gap: 11 mEq/L (ref 3–11)
BILIRUBIN TOTAL: 0.6 mg/dL (ref 0.20–1.20)
BUN: 16.5 mg/dL (ref 7.0–26.0)
CALCIUM: 9 mg/dL (ref 8.4–10.4)
CO2: 23 mEq/L (ref 22–29)
CREATININE: 0.7 mg/dL (ref 0.6–1.1)
Chloride: 107 mEq/L (ref 98–109)
EGFR: 82 mL/min/{1.73_m2} — ABNORMAL LOW (ref >90)
GLUCOSE: 123 mg/dL (ref 70–140)
POTASSIUM: 3.5 meq/L (ref 3.5–5.1)
SODIUM: 140 meq/L (ref 136–145)
TOTAL PROTEIN: 6.1 g/dL — AB (ref 6.4–8.3)

## 2016-06-14 MED ORDER — MONTELUKAST SODIUM 10 MG PO TABS
ORAL_TABLET | ORAL | Status: AC
  Filled 2016-06-14: qty 1

## 2016-06-14 MED ORDER — MONTELUKAST SODIUM 10 MG PO TABS
10.0000 mg | ORAL_TABLET | Freq: Once | ORAL | Status: AC
  Administered 2016-06-14: 10 mg via ORAL

## 2016-06-14 MED ORDER — ACETAMINOPHEN 325 MG PO TABS
ORAL_TABLET | ORAL | Status: AC
  Filled 2016-06-14: qty 2

## 2016-06-14 MED ORDER — DIPHENHYDRAMINE HCL 25 MG PO CAPS
ORAL_CAPSULE | ORAL | Status: AC
  Filled 2016-06-14: qty 2

## 2016-06-14 MED ORDER — METHYLPREDNISOLONE SODIUM SUCC 125 MG IJ SOLR
INTRAMUSCULAR | Status: AC
  Filled 2016-06-14: qty 2

## 2016-06-14 MED ORDER — PROCHLORPERAZINE MALEATE 10 MG PO TABS
ORAL_TABLET | ORAL | Status: AC
  Filled 2016-06-14: qty 1

## 2016-06-14 MED ORDER — SODIUM CHLORIDE 0.9 % IV SOLN
16.0000 mg/kg | Freq: Once | INTRAVENOUS | Status: AC
  Administered 2016-06-14: 900 mg via INTRAVENOUS
  Filled 2016-06-14: qty 40

## 2016-06-14 MED ORDER — ACETAMINOPHEN 325 MG PO TABS
650.0000 mg | ORAL_TABLET | Freq: Once | ORAL | Status: AC
  Administered 2016-06-14: 650 mg via ORAL

## 2016-06-14 MED ORDER — METHYLPREDNISOLONE SODIUM SUCC 125 MG IJ SOLR
125.0000 mg | Freq: Once | INTRAMUSCULAR | Status: AC
  Administered 2016-06-14: 125 mg via INTRAVENOUS

## 2016-06-14 MED ORDER — SODIUM CHLORIDE 0.9 % IV SOLN
Freq: Once | INTRAVENOUS | Status: AC
  Administered 2016-06-14: 10:00:00 via INTRAVENOUS

## 2016-06-14 MED ORDER — DIPHENHYDRAMINE HCL 25 MG PO CAPS
50.0000 mg | ORAL_CAPSULE | Freq: Once | ORAL | Status: AC
  Administered 2016-06-14: 50 mg via ORAL

## 2016-06-14 MED ORDER — PROCHLORPERAZINE MALEATE 10 MG PO TABS
10.0000 mg | ORAL_TABLET | Freq: Once | ORAL | Status: AC
  Administered 2016-06-14: 10 mg via ORAL

## 2016-06-14 MED ORDER — BORTEZOMIB CHEMO SQ INJECTION 3.5 MG (2.5MG/ML)
1.3000 mg/m2 | Freq: Once | INTRAMUSCULAR | Status: AC
  Administered 2016-06-14: 2 mg via SUBCUTANEOUS
  Filled 2016-06-14: qty 2

## 2016-06-21 ENCOUNTER — Ambulatory Visit (HOSPITAL_BASED_OUTPATIENT_CLINIC_OR_DEPARTMENT_OTHER): Payer: Medicare Other

## 2016-06-21 ENCOUNTER — Other Ambulatory Visit (HOSPITAL_BASED_OUTPATIENT_CLINIC_OR_DEPARTMENT_OTHER): Payer: Medicare Other

## 2016-06-21 ENCOUNTER — Other Ambulatory Visit: Payer: Self-pay | Admitting: Hematology

## 2016-06-21 VITALS — BP 117/72 | HR 79 | Temp 98.4°F | Resp 18

## 2016-06-21 DIAGNOSIS — C9002 Multiple myeloma in relapse: Secondary | ICD-10-CM

## 2016-06-21 DIAGNOSIS — C9000 Multiple myeloma not having achieved remission: Secondary | ICD-10-CM

## 2016-06-21 DIAGNOSIS — Z5112 Encounter for antineoplastic immunotherapy: Secondary | ICD-10-CM | POA: Diagnosis not present

## 2016-06-21 LAB — CBC WITH DIFFERENTIAL/PLATELET
BASO%: 0.9 % (ref 0.0–2.0)
BASOS ABS: 0 10*3/uL (ref 0.0–0.1)
EOS ABS: 0 10*3/uL (ref 0.0–0.5)
EOS%: 1.3 % (ref 0.0–7.0)
HEMATOCRIT: 34.7 % — AB (ref 34.8–46.6)
HEMOGLOBIN: 11.3 g/dL — AB (ref 11.6–15.9)
LYMPH#: 0.4 10*3/uL — AB (ref 0.9–3.3)
LYMPH%: 13.5 % — ABNORMAL LOW (ref 14.0–49.7)
MCH: 35.9 pg — ABNORMAL HIGH (ref 25.1–34.0)
MCHC: 32.6 g/dL (ref 31.5–36.0)
MCV: 110 fL — AB (ref 79.5–101.0)
MONO#: 0.3 10*3/uL (ref 0.1–0.9)
MONO%: 8.6 % (ref 0.0–14.0)
NEUT%: 75.7 % (ref 38.4–76.8)
NEUTROS ABS: 2.5 10*3/uL (ref 1.5–6.5)
Platelets: 152 10*3/uL (ref 145–400)
RBC: 3.15 10*6/uL — ABNORMAL LOW (ref 3.70–5.45)
RDW: 19 % — AB (ref 11.2–14.5)
WBC: 3.3 10*3/uL — AB (ref 3.9–10.3)

## 2016-06-21 LAB — COMPREHENSIVE METABOLIC PANEL
ALBUMIN: 3.7 g/dL (ref 3.5–5.0)
ALK PHOS: 138 U/L (ref 40–150)
ALT: 16 U/L (ref 0–55)
AST: 17 U/L (ref 5–34)
Anion Gap: 13 mEq/L — ABNORMAL HIGH (ref 3–11)
BILIRUBIN TOTAL: 0.62 mg/dL (ref 0.20–1.20)
BUN: 15.3 mg/dL (ref 7.0–26.0)
CALCIUM: 9.1 mg/dL (ref 8.4–10.4)
CO2: 24 mEq/L (ref 22–29)
Chloride: 105 mEq/L (ref 98–109)
Creatinine: 0.8 mg/dL (ref 0.6–1.1)
EGFR: 78 mL/min/{1.73_m2} — AB (ref >90)
GLUCOSE: 105 mg/dL (ref 70–140)
Potassium: 3.5 mEq/L (ref 3.5–5.1)
SODIUM: 142 meq/L (ref 136–145)
TOTAL PROTEIN: 6.3 g/dL — AB (ref 6.4–8.3)

## 2016-06-21 MED ORDER — METHYLPREDNISOLONE SODIUM SUCC 125 MG IJ SOLR
INTRAMUSCULAR | Status: AC
  Filled 2016-06-21: qty 2

## 2016-06-21 MED ORDER — SODIUM CHLORIDE 0.9 % IV SOLN
16.0000 mg/kg | Freq: Once | INTRAVENOUS | Status: AC
  Administered 2016-06-21: 900 mg via INTRAVENOUS
  Filled 2016-06-21: qty 40

## 2016-06-21 MED ORDER — DIPHENHYDRAMINE HCL 25 MG PO CAPS
50.0000 mg | ORAL_CAPSULE | Freq: Once | ORAL | Status: AC
  Administered 2016-06-21: 50 mg via ORAL

## 2016-06-21 MED ORDER — PROCHLORPERAZINE MALEATE 10 MG PO TABS
ORAL_TABLET | ORAL | Status: AC
  Filled 2016-06-21: qty 1

## 2016-06-21 MED ORDER — METHYLPREDNISOLONE SODIUM SUCC 125 MG IJ SOLR
125.0000 mg | Freq: Once | INTRAMUSCULAR | Status: AC
  Administered 2016-06-21: 125 mg via INTRAVENOUS

## 2016-06-21 MED ORDER — PROCHLORPERAZINE MALEATE 10 MG PO TABS
10.0000 mg | ORAL_TABLET | Freq: Once | ORAL | Status: AC
  Administered 2016-06-21: 10 mg via ORAL

## 2016-06-21 MED ORDER — ACETAMINOPHEN 325 MG PO TABS
650.0000 mg | ORAL_TABLET | Freq: Once | ORAL | Status: AC
  Administered 2016-06-21: 650 mg via ORAL

## 2016-06-21 MED ORDER — MONTELUKAST SODIUM 10 MG PO TABS
10.0000 mg | ORAL_TABLET | Freq: Once | ORAL | Status: AC
  Administered 2016-06-21: 10 mg via ORAL

## 2016-06-21 MED ORDER — PROCHLORPERAZINE MALEATE 10 MG PO TABS: 10.0000 mg | ORAL_TABLET | Freq: Once | ORAL | Status: DC

## 2016-06-21 MED ORDER — ACETAMINOPHEN 325 MG PO TABS
ORAL_TABLET | ORAL | Status: AC
  Filled 2016-06-21: qty 2

## 2016-06-21 MED ORDER — BORTEZOMIB CHEMO SQ INJECTION 3.5 MG (2.5MG/ML)
1.3000 mg/m2 | Freq: Once | INTRAMUSCULAR | Status: AC
  Administered 2016-06-21: 2 mg via SUBCUTANEOUS
  Filled 2016-06-21: qty 2

## 2016-06-21 MED ORDER — MONTELUKAST SODIUM 10 MG PO TABS
ORAL_TABLET | ORAL | Status: AC
  Filled 2016-06-21: qty 1

## 2016-06-21 MED ORDER — DIPHENHYDRAMINE HCL 25 MG PO CAPS
ORAL_CAPSULE | ORAL | Status: AC
  Filled 2016-06-21: qty 2

## 2016-06-21 MED ORDER — SODIUM CHLORIDE 0.9 % IV SOLN
Freq: Once | INTRAVENOUS | Status: AC
Start: 2016-06-21 — End: 2016-06-21
  Administered 2016-06-21: 10:00:00 via INTRAVENOUS

## 2016-06-21 NOTE — Patient Instructions (Signed)
[facility] Discharge Instructions for Patients Receiving Chemotherapy  Today you received the following chemotherapy agents Darzalex and Velcade   To help prevent nausea and vomiting after your treatment, we encourage you to take your nausea medication as directed.    If you develop nausea and vomiting that is not controlled by your nausea medication, call the clinic.   BELOW ARE SYMPTOMS THAT SHOULD BE REPORTED IMMEDIATELY:  *FEVER GREATER THAN 100.5 F  *CHILLS WITH OR WITHOUT FEVER  NAUSEA AND VOMITING THAT IS NOT CONTROLLED WITH YOUR NAUSEA MEDICATION  *UNUSUAL SHORTNESS OF BREATH  *UNUSUAL BRUISING OR BLEEDING  TENDERNESS IN MOUTH AND THROAT WITH OR WITHOUT PRESENCE OF ULCERS  *URINARY PROBLEMS  *BOWEL PROBLEMS  UNUSUAL RASH Items with * indicate a potential emergency and should be followed up as soon as possible.  Feel free to call the clinic you have any questions or concerns. The clinic phone number is [phone].  Please show the CHEMO ALERT CARD at check-in to the Emergency Department and triage nurse.   

## 2016-06-27 ENCOUNTER — Ambulatory Visit (HOSPITAL_COMMUNITY): Payer: Medicare Other | Attending: Cardiology

## 2016-06-27 ENCOUNTER — Other Ambulatory Visit: Payer: Self-pay

## 2016-06-27 DIAGNOSIS — I509 Heart failure, unspecified: Secondary | ICD-10-CM | POA: Diagnosis present

## 2016-06-27 DIAGNOSIS — I517 Cardiomegaly: Secondary | ICD-10-CM | POA: Insufficient documentation

## 2016-06-27 DIAGNOSIS — I252 Old myocardial infarction: Secondary | ICD-10-CM | POA: Diagnosis not present

## 2016-06-27 DIAGNOSIS — I340 Nonrheumatic mitral (valve) insufficiency: Secondary | ICD-10-CM | POA: Diagnosis not present

## 2016-06-27 DIAGNOSIS — I2510 Atherosclerotic heart disease of native coronary artery without angina pectoris: Secondary | ICD-10-CM | POA: Insufficient documentation

## 2016-06-27 DIAGNOSIS — I5022 Chronic systolic (congestive) heart failure: Secondary | ICD-10-CM | POA: Diagnosis not present

## 2016-06-28 ENCOUNTER — Ambulatory Visit (HOSPITAL_COMMUNITY)
Admission: RE | Admit: 2016-06-28 | Discharge: 2016-06-28 | Disposition: A | Discharge status: Home / Self Care | Payer: Medicare Other | Source: Ambulatory Visit | Attending: Hematology | Admitting: Hematology

## 2016-06-28 ENCOUNTER — Encounter: Payer: Self-pay | Admitting: Hematology

## 2016-06-28 ENCOUNTER — Ambulatory Visit (HOSPITAL_BASED_OUTPATIENT_CLINIC_OR_DEPARTMENT_OTHER): Payer: Medicare Other | Admitting: Hematology

## 2016-06-28 ENCOUNTER — Other Ambulatory Visit (HOSPITAL_BASED_OUTPATIENT_CLINIC_OR_DEPARTMENT_OTHER): Payer: Medicare Other

## 2016-06-28 ENCOUNTER — Ambulatory Visit (HOSPITAL_BASED_OUTPATIENT_CLINIC_OR_DEPARTMENT_OTHER): Payer: Medicare Other

## 2016-06-28 ENCOUNTER — Telehealth: Payer: Self-pay | Admitting: Physician Assistant

## 2016-06-28 VITALS — BP 118/75 | HR 77 | Temp 97.7°F | Resp 18 | Ht 66.0 in | Wt 123.8 lb

## 2016-06-28 DIAGNOSIS — C9002 Multiple myeloma in relapse: Secondary | ICD-10-CM | POA: Diagnosis not present

## 2016-06-28 DIAGNOSIS — I5022 Chronic systolic (congestive) heart failure: Secondary | ICD-10-CM | POA: Diagnosis not present

## 2016-06-28 DIAGNOSIS — M810 Age-related osteoporosis without current pathological fracture: Secondary | ICD-10-CM | POA: Diagnosis not present

## 2016-06-28 DIAGNOSIS — C9000 Multiple myeloma not having achieved remission: Secondary | ICD-10-CM | POA: Diagnosis not present

## 2016-06-28 DIAGNOSIS — J069 Acute upper respiratory infection, unspecified: Secondary | ICD-10-CM | POA: Diagnosis not present

## 2016-06-28 DIAGNOSIS — M818 Other osteoporosis without current pathological fracture: Secondary | ICD-10-CM | POA: Diagnosis not present

## 2016-06-28 DIAGNOSIS — I252 Old myocardial infarction: Secondary | ICD-10-CM

## 2016-06-28 DIAGNOSIS — D61818 Other pancytopenia: Secondary | ICD-10-CM | POA: Diagnosis not present

## 2016-06-28 DIAGNOSIS — Z5112 Encounter for antineoplastic immunotherapy: Secondary | ICD-10-CM | POA: Diagnosis not present

## 2016-06-28 DIAGNOSIS — R05 Cough: Secondary | ICD-10-CM | POA: Diagnosis not present

## 2016-06-28 LAB — COMPREHENSIVE METABOLIC PANEL WITH GFR
ALT: 16 U/L (ref 0–55)
AST: 17 U/L (ref 5–34)
Albumin: 3.5 g/dL (ref 3.5–5.0)
Alkaline Phosphatase: 102 U/L (ref 40–150)
Anion Gap: 9 meq/L (ref 3–11)
BUN: 19 mg/dL (ref 7.0–26.0)
CO2: 23 meq/L (ref 22–29)
Calcium: 8.9 mg/dL (ref 8.4–10.4)
Chloride: 107 meq/L (ref 98–109)
Creatinine: 0.7 mg/dL (ref 0.6–1.1)
EGFR: 81 mL/min/{1.73_m2} — ABNORMAL LOW
Glucose: 105 mg/dL (ref 70–140)
Potassium: 3.9 meq/L (ref 3.5–5.1)
Sodium: 140 meq/L (ref 136–145)
Total Bilirubin: 0.57 mg/dL (ref 0.20–1.20)
Total Protein: 5.9 g/dL — ABNORMAL LOW (ref 6.4–8.3)

## 2016-06-28 LAB — CBC WITH DIFFERENTIAL/PLATELET
BASO%: 0.6 % (ref 0.0–2.0)
Basophils Absolute: 0 10*3/uL (ref 0.0–0.1)
EOS ABS: 0 10*3/uL (ref 0.0–0.5)
EOS%: 1.1 % (ref 0.0–7.0)
HCT: 30.8 % — ABNORMAL LOW (ref 34.8–46.6)
HGB: 10.1 g/dL — ABNORMAL LOW (ref 11.6–15.9)
LYMPH%: 9.9 % — AB (ref 14.0–49.7)
MCH: 36.3 pg — ABNORMAL HIGH (ref 25.1–34.0)
MCHC: 32.8 g/dL (ref 31.5–36.0)
MCV: 110.8 fL — AB (ref 79.5–101.0)
MONO#: 0.2 10*3/uL (ref 0.1–0.9)
MONO%: 5.7 % (ref 0.0–14.0)
NEUT%: 82.7 % — ABNORMAL HIGH (ref 38.4–76.8)
NEUTROS ABS: 2.9 10*3/uL (ref 1.5–6.5)
Platelets: 114 10*3/uL — ABNORMAL LOW (ref 145–400)
RBC: 2.78 10*6/uL — AB (ref 3.70–5.45)
RDW: 16.8 % — ABNORMAL HIGH (ref 11.2–14.5)
WBC: 3.5 10*3/uL — AB (ref 3.9–10.3)
lymph#: 0.4 10*3/uL — ABNORMAL LOW (ref 0.9–3.3)

## 2016-06-28 MED ORDER — PROCHLORPERAZINE MALEATE 10 MG PO TABS
ORAL_TABLET | ORAL | Status: AC
  Filled 2016-06-28: qty 1

## 2016-06-28 MED ORDER — PROCHLORPERAZINE MALEATE 10 MG PO TABS
10.0000 mg | ORAL_TABLET | Freq: Once | ORAL | Status: AC
  Administered 2016-06-28: 10 mg via ORAL

## 2016-06-28 MED ORDER — BORTEZOMIB CHEMO SQ INJECTION 3.5 MG (2.5MG/ML)
1.3000 mg/m2 | Freq: Once | INTRAMUSCULAR | Status: AC
  Administered 2016-06-28: 2 mg via SUBCUTANEOUS
  Filled 2016-06-28: qty 2

## 2016-06-28 MED ORDER — LEVOFLOXACIN 750 MG PO TABS: 750.0000 mg | ORAL_TABLET | Freq: Every day | ORAL | 0 refills | Status: DC

## 2016-06-28 NOTE — Telephone Encounter (Signed)
New message ° ° ° ° ° °Returning a call to the nurse to get echo results °

## 2016-06-28 NOTE — Telephone Encounter (Signed)
Pt has been made aware of her echo results. She is coming in for f/u appt 07/02/16 @ 2:00. Pt verbalized understanding.

## 2016-06-28 NOTE — Patient Instructions (Signed)
[facility] Discharge Instructions for Patients Receiving Chemotherapy  Today you received the following chemotherapy agents Velcade. To help prevent nausea and vomiting after your treatment, we encourage you to take your nausea medication as directed.  If you develop nausea and vomiting that is not controlled by your nausea medication, call the clinic.   BELOW ARE SYMPTOMS THAT SHOULD BE REPORTED IMMEDIATELY:  *FEVER GREATER THAN 100.5 F  *CHILLS WITH OR WITHOUT FEVER  NAUSEA AND VOMITING THAT IS NOT CONTROLLED WITH YOUR NAUSEA MEDICATION  *UNUSUAL SHORTNESS OF BREATH  *UNUSUAL BRUISING OR BLEEDING  TENDERNESS IN MOUTH AND THROAT WITH OR WITHOUT PRESENCE OF ULCERS  *URINARY PROBLEMS  *BOWEL PROBLEMS  UNUSUAL RASH Items with * indicate a potential emergency and should be followed up as soon as possible.  Feel free to call the clinic you have any questions or concerns. The clinic phone number is [phone].  Please show the CHEMO ALERT CARD at check-in to the Emergency Department and triage nurse.    

## 2016-06-28 NOTE — Progress Notes (Signed)
[facility] ONCOLOGY OFFICE PROGRESS NOTE   Shannon Rush,Shannon ARTHUR, Shannon Rush [address] [place] [address]  DIAGNOSIS: Multiple myeloma with relapse  CHIEF COMPLAINS: Follow-up of multiple myeloma  CURRENT TREATMENT:   1. Pomalidomide 4 mg daily on day 1-21, dexamethasone 40 mg on day 1, 8 and 15, every 28 days, started on 02/27/2016, daratumumab weekly started on 03/07/2016, treatment on hold since 03/09/2016 due to hospitalization and severe cytopenia 2. Daratumumab restarted on 04/26/2016, Velcade 1.3mg/m2 and dexa 20mg weekly start on 11/3 2. Zometa 3.5 mg monthly started on 06/2013, on every 3 months now, last 05/03/2016   Oncology History   Multiple myeloma (HCC)   Staging form: Multiple Myeloma, AJCC 6th Edition   - Clinical: Stage IIIA - Signed by David Chism, Shannon Rush on 09/04/2013      Multiple myeloma (HCC)   05/26/2012 Initial Diagnosis    Presenting IgG was 4,040 mg/dL on 06/05/2012 (IgA 35; IgM 34); kappa free light chain 34.4 mg/dL, lambda 0.00, kappa:lambda ratio of 34.75; SPEP with M-spike of 2.60.      06/30/2012 Imaging    Numerous lytic lesions throughout the calvarium.  Lytic lesion within the left lateral scapula.  Findings most compatible with metastases or myeloma. Multiple mid and lower thoracic compression fractures.  Slight compression through the endplates at L3.      07/03/2012 Bone Marrow Biopsy    Bone marrow biopsy showed 49% plasma cell. Normal classical cytogenetics; however, myeloma FISH panel showed 13q- (intermediate risk)          07/14/2012 - 07/27/2012 Radiation Therapy    Palliative radiation 20 Gy over 10 fractions between to thoracic spine cord compression and symptomatic left scapula lytic lesion.      08/10/2012 -  Chemotherapy    Started SQ Velcade once weekly, 3 weeks on, 1 weeks off; daily Revlimid d1-21, 7 days off; and Dexamethasone 40mg PO weekly.       02/12/2013 Bone Marrow Transplant    Auto bone marrow  transplant at [facility]        06/14/2013 Tumor Marker    IgG  1830,  Kappa:lamba ratio, 1.69 (baseline was 34.75 or   M-spike 0.28 (baseline of 2.6 or  89.3% of baseline).        06/25/2013 -  Chemotherapy    Started zometa 3.5 mg monthly       09/01/2013 -  Chemotherapy    Maintenance therapy with revlimid 5mg daily for 14 days and then off for 7 (decreased from 21 days on and 7 days off based on neutropenia).       09/13/2013 - 09/17/2014 Hospital Admission    Hospital admission for pneumonia      12/20/2013 Treatment Plan Change    Maintenance therapy decreased to 2.5 mg daily for 21 days and then off for 7 days based on low counts.       01/25/2014 Tumor Marker    IgG 1360 M spike 0.5      03/01/2014 - 03/05/2014 Hospital Admission    [facility] with an inferior STEMI, received thrombolytic therapy, cath showed 60% prox RCA, mid-distal RCA 50%, Echo normal, no stents.      04/11/2014 - 08/07/2014 Chemotherapy    Continue Revlimid at 2.5 mg 3 weeks on 1 week off      08/08/2014 - 08/13/2014 Hospital Admission    Hospital admission for pneumonia. Her Revlimid was held.      08/29/2014 - 09/14/2014 Chemotherapy      Maintenance Revlimid restarted      09/14/2014 - 09/17/2014 Hospital Admission    Admitted for pneumonia after coming back from a trip in [name]. Revlimid was held again.      11/13/2014 - 12/25/2015 Chemotherapy    Maintenance Revlimid restarted, changed to 2.'5mg'$  daily, 2 weks on, 1 week off from 12/15/2014, stopped due to disease progression       01/24/2016 Progression    Patient is M protein has gradually increased to 1.0g, repeated a bone marrow biopsy showed plasma cell 10-30%      02/27/2016 -  Chemotherapy    Pomalidomide 4 mg daily on day 1-21, dexamethasone 40 mg on day 1, 8 and 15, every 28 days, started on 02/27/2016, daratumumab weekly started on 03/07/2016, held after first dose, due to hospitalization and a severe pancytopenia       03/10/2016 - 03/29/2016 Hospital Admission    Pt was admitted for sepsis from pneumonia, and severe pancytopenia. She required ICU stay for a few days due to hypotension, developed b/l pleural effusion required diuretics and b/l thoracentesis. She also required blood and plt transfusion, and prolonged neupogen injection for severe neutropenia. She was discharged home after 3 weeks hospital stay       04/26/2016 -  Chemotherapy    Daratumumab restarted on 04/26/2016, Velcade 1.'3mg'$ /m2 and dexa '20mg'$  weekly start on 11/3      INTERVAL HISTORY:  Shannon Rush 75 y.o. female with a past medical history of MM as detailed above, s/p SCT is here for follow-up. She has been having productive cough with yellow sputums, small amount for the past 2-3 weeks, cough is mild, no chest pain, dyspnea, fever, chills. Her appetite and energy level remains to be good. She still bikes, goes to school for one T at work, no limitation of her activities. She also had a small wound in her right leg above the ankle from injury from her car door about 2-3 weeks ago, she has been using topical antibiotics, the wound is healed, but she still has skin erythema and swollen around it.  MEDICAL HISTORY: Past Medical History:  Diagnosis Date  . CAD (coronary artery disease)    a. inf STEMI (in [place] >>> [facility]) >>> LHC (8/15- [place]):  pRCA 60%, mid to dist RCA 50% >>> med rx  . Cord compression ([place]) 07/13/12   MRI- diffuse myeloma involvement of T-L spine  . History of radiation therapy 07/13/12-07/27/12   spinal cord compression T3-T10,left scapula  . Hx of echocardiogram    Echo (03/02/14-done at [facility]):  Normal LVEF without significant regional wall motion abnormalities. Mild   . Multiple myeloma ([place]) 07/01/2012  . OSTEOPOROSIS 06/11/2010   Multiple compression fractures; and spontaneous fracture of sternum Qualifier: Diagnosis of  By: [name], [name]     .  Thoracic kyphosis 07/13/12   per MRI scan  . Unspecified deficiency anemia      ALLERGIES:  is allergic to zosyn [piperacillin sod-tazobactam so]; other; piperacillin-tazobactam in dex; zithromax [azithromycin]; and ciprofloxacin.  MEDICATIONS: has a current medication list which includes the following prescription(s): acetaminophen, acyclovir, atorvastatin, calcium carbonate, clopidogrel, dexamethasone, fluticasone, metoprolol succinate, nitroglycerin, ondansetron, proair hfa, prochlorperazine, and levofloxacin, and the following Facility-Administered Medications: sodium chloride, sodium chloride, acetaminophen, acetaminophen, furosemide, furosemide, heparin lock flush, heparin lock flush, heparin lock flush, heparin lock flush, sodium chloride flush, sodium chloride flush, sodium chloride flush, sodium chloride flush, and sodium chloride flush.  SURGICAL  HISTORY:  Past Surgical History:  Procedure Laterality Date  . APPENDECTOMY    . CESAREAN SECTION     x2   . COLONOSCOPY  2007   neg with Dr. [name]  . ELBOW SURGERY    . HIP SURGERY  2009   left  . TUBAL LIGATION      REVIEW OF SYSTEMS:   Constitutional: Denies abnormal weight loss Eyes: Denies blurriness of vision Ears, nose, mouth, throat, and face: Denies mucositis or sore throat,  Respiratory: Denies dyspnea or wheezes  Cardiovascular: Denies palpitation, chest discomfort  Gastrointestinal:  Denies nausea, heartburn or change in bowel habits Skin: Denies abnormal skin rashes Lymphatics: Denies new lymphadenopathy or easy bruising Neurological:Denies numbness, tingling or new weaknesses Behavioral/Psych: Mood is stable, no new changes  All other systems were reviewed with the patient and are negative.   PHYSICAL EXAMINATION: ECOG PERFORMANCE STATUS: 1 Blood pressure 118/75, pulse 77, temperature 97.7 F (36.5 C), temperature source Oral, resp. rate 18, height _0  (1.676 m), weight 123 lb 12.8 oz (56.2 kg), SpO2 100  %. GENERAL:alert, no distress and comfortable; easily mobile to exam table; kyphosis, moderate.  SKIN: skin color, texture, turgor are normal, no rashes or significant lesions except a small healing, dry wound in the right lower leg above his ankle, with surrounding skin erythema and swelling EYES: normal, Conjunctiva are pink, 1 tear/injectionof blood vessel left eye in sclera. OROPHARYNX:no exudate, no erythema and lips, buccal mucosa, and tongue normal, no ONJ NECK: supple, thyroid normal size, non-tender, without nodularity; nodules cervical bilaterally.  LYMPH:  no palpable lymphadenopathy in the cervical, axillary or supraclavicular LUNGS: crackles at the bases bilaterally with normal breathing effort, no wheezes or rhonchi HEART: regular rate & rhythm and no murmurs  ABDOMEN:abdomen soft, non-tender and normal bowel sounds Musculoskeletal:no cyanosis of digits and no clubbing, (+) spine scoliosis  NEURO: alert & oriented x 3 with fluent speech, no focal motor/sensory deficits   Labs:  CBC Latest Ref Rng & Units 06/28/2016 06/21/2016 06/14/2016  WBC 3.9 - 10.3 10e3/uL 3.5(L) 3.3(L) 3.8(L)  Hemoglobin 11.6 - 15.9 g/dL 10.1(L) 11.3(L) 10.1(L)  Hematocrit 34.8 - 46.6 % 30.8(L) 34.7(L) 31.0(L)  Platelets 145 - 400 10e3/uL 114(L) 152 155    CMP Latest Ref Rng & Units 06/28/2016 06/21/2016 06/14/2016  Glucose 70 - 140 mg/dl 105 105 123  BUN 7.0 - 26.0 mg/dL 19.0 15.3 16.5  Creatinine 0.6 - 1.1 mg/dL 0.7 0.8 0.7  Sodium 136 - 145 mEq/L 140 142 140  Potassium 3.5 - 5.1 mEq/L 3.9 3.5 3.5  Chloride 101 - 111 mmol/L - - -  CO2 22 - 29 mEq/L _1 Calcium 8.4 - 10.4 mg/dL 8.9 9.1 9.0  Total Protein 6.4 - 8.3 g/dL 5.9(L) 6.3(L) 6.1(L)  Total Bilirubin 0.20 - 1.20 mg/dL 0.57 0.62 0.60  Alkaline Phos 40 - 150 U/L 102 138 160(H)  AST 5 - 34 U/L _2 ALT 0 - 55 U/L _3 ANC 2.6K  SPEP M-PROTEIN 10/01/2013: 0.18 (nadir)  10/13/2014: 0.6 11/21/2014: 0.5 01/30/2015: 0.6 03/13/2015:  0.6 04/20/2015: 0.5 06/01/2015: 0.5 07/20/2015: 0.7 09/14/2015: 0.7 10/26/2015: 0.8 12/07/2015: 1.0 02/07/2016: 1.1  04/03/2016: 0.2 05/03/2016: 0.2  05/31/2016: 0.1  IgG [phone]) 10/13/2014: 1710 11/21/2014: 1460 01/30/2015: 1380 03/13/15: 1570 04/20/2015: 1550 06/01/2015: 1590 07/22/2015: 1770 09/14/2015: 1787 10/26/2015: 1898 12/07/2015: 2045 02/07/2016: 1883 04/03/2016: 787 05/03/2016: 685  05/31/2016: 544  Serum kappa, lambda light chains, and ratio  11/21/2014: 3.95, 2.08,  1.90  01/30/2015: 3.92, 2.64, 1.48 03/13/2015: 3.55, 2.17, 1.64 04/20/15: 4.52, 1.78, 2.54 06/01/2015: 4.82, 1.75, 2.75 07/20/2015: 8.71, 2.43, 3.58 09/14/2015: 9.17, 2.02, 4.55 10/26/2015: 11.4, 1.75, 6.42 12/07/2015: 1.54, 1.83, 8.44 02/07/2016: 1.93, 1.42, 13.6 04/03/2016: 0.96, 0.95, 1.01  05/03/2016: 0.71, 0.34, 2.09  05/31/2016: 0.48, 0.26, 1.85  24 hr urine PEP (total protein, M-spike) 10/13/2014: (-) 02/12/2016: 284m, 656m9/22/2017: 114 mg, (-)  PATHOLOGY REPORT  Diagnosis 01/24/2016 Bone Marrow, Aspirate,Biopsy, and Clot, RT iliac and core - VARIABLY CELLULAR BONE MARROW WITH PLASMA CELL NEOPLASM. - SEE COMMENT. PERIPHERAL BLOOD: - MACROCYTIC ANEMIA. Diagnosis Note The bone marrow is variably cellular with trilineage hematopoiesis and non specific myeloid changes. In this background, the plasma cell component is variable ranging from less than 10 % in many areas to 30% in some aspirate particles with relatively low cellularity. Foci of interstitial infiltrates and small clusters are also seen in the clot section. Immunohistochemical stains show kappa light chain restriction in atypical plasma cell clusters and infiltrates consistent with plasma cell neoplasm. Correlation with cytogenetic and FISH studies is recommended. (BNS:gt, 01/2016-07-29  RADIOGRAPHIC STUDIES: None  ASSESSMENT: Shannon HARBIN89.o. female with a history of Multiple Myeloma s/p autotransplant in August 2014 followed by maintenence low  dose Revlimid therapy, and relapsed in 01/2016  PLAN:   1. IgG kappa Multiple myeloma s/p Auto SCT, intermediate risk, relapsed in 01/2016 --MM markers demonstrate a very good initial response (M protein baseline 2.6, down to 0.18 after induction chemo and transplant, stable around 0.5 since July 2015, but has gradually increased lately),  she unfortunately relapsed in July 2017 -I previously discussed her repeated bone marrow biopsy from 02/01/2016, which showed 10-30% plasma cells in her marrow -I agree with Dr. GaKendell Baneecommendation to stop revlimid and change to Daratumumab, Pomalidomide and dexamethasone due to her disease relapse. I don't think she has resistance to Revlimid, she has been on very small dose of Revlimid due to her prior history of multiple infections. -She started first cycle DaraPD in 02/2016, but in 2 weeks she developed severe pancytopenia, pneumonia with sepsis required 3 weeks hospital stay. She did have a very nice response to the treatment, her M-protein has significantly reduced from 1.2 to 0.2, light chain levels normalized even with half cycle treatment  -I recommended her to change regiment to DaraVD, and started her on dara every 2 weeks for 2 doses, she tolerated this very well -lab reviewed, mild leukocytopenia, and anemia, all stable, due to her productive cough, and mild cellulitis in right leg, I will hold her daratumumab for now, will proceed with velcade injection today, and hold for the next 2-3 weeks, and let her recover from the infections  -We'll restart treatment after she comes back from her holidays   2. Pancytopenia  -secondary to chemo  -Mild leukopenia, no neutropenia, mild anemia stable -Continue monitoring closely  3. CHF, CAD, inferior STEMI in 02/2014 -continue plavix, metoprolol and lipitor  -follow up with Dr. NiJohnsie Cancel-her recent echo showed EF 45-50% -her b/l pleural effusion much improved after lasix and thoracentesis, she is off lasix  now   4. Osteoporosis -recently repeated bone density scan showed worsening osteoporosis, continue zometa, changed to every 3 months after 03/2015.  -she declined repeating DEXA  -continue Zometa every 3 months, next due in Jan 2018   5. Bronchitis -She has had productive cough for 2-3 weeks, exam was unremarkable, I'll get a chest x-ray today -Due to her immunocompromised status,  I'll give her a course of Levaquin for 10 days  6. Right lower extremity cellulitis -Secondary to a small wound, which is healing. -She will receive Levaquin for bronchitis and cellulitis   Plan -will hold daratumumab today, continue Velcade and dexa 68m (she took at home) today, and hold treatment for the next 3 weeks -CXR today -Levaquin 750 mg daily for 10 days  -Lab, follow-up and treatment (DaraVD) on 1/9 or 1/10  All questions were answered. The patient knows to call the clinic with any problems, questions or concerns. We can certainly see the patient much sooner if necessary.  I spent 20 minutes counseling the patient face to face. The total time spent in the appointment was 25 minutes.    FTruitt Merle 06/28/2016

## 2016-06-30 NOTE — Progress Notes (Signed)
Cardiology Office Note    Date:  07/02/2016   ID:  Shannon Rush, DOB 09/08/1940, MRN [id number]  PCP:  [name], MD  Cardiologist:  Dr. Meda Coffee  CC: follow up- cardiomyopathy and mass on TTE      History of Present Illness:  Shannon Rush is a 74 y.o. female with a history of multiple myeloma s/p bone marrow transplant (followed at [facility] by Dr. [name] and in [place] by Dr. Juliann Mule), osteoporosis, HLD and CAD: inferior STEMI s/p thrombolytics (02/2014) who presents to clinic for follow up.   Patient was seen by Dr. Johnsie Cancel in 12/2012. There were reports of an abnormal echocardiogram. 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   Admitted 8/18-8/22/15 to the [facility] with an inferior STEMI. She received thrombolytics. Cardiac catheterization demonstrated 60% proximal RCA, mid to distal RCA 50% (lesion segmental and thrombotic). Echocardiogram demonstrated normal LV function. Patient was continued on heparin and aspirin. No intervention was performed. Recommendation was for the patient to return to [facility] and have a nuclear stress test in 6 weeks. Patient was on Revlimid (for pancytopenia from chemotherapy) and the DC summary from the hospital in [place] cites this as a possible cause for her STEMI.   She has a known hx of multiple myeloma since 2013 treated with Revlimid, recently switched to pomalidomide, dexamethasone, daratumumab with resultant pancytopenia. She was admitted from 8/28-9/15/17 for sepsis and acute respiratory failure 2/2 HCAP with acute on chronic systolic CHF with pleural effusions. The pt was shocky and required IVF, transfer to ICU, and IV pressors. She was noted to be thrombocytopenic and her Plavix was stopped. She improved initially but has had persistent hypoxia. Chest CT 03/24/16 showed moderate bilateral effusions.  She was given Lasix but this has been stopped secondary to hypotension. Echo done 03/24/16 showed an EF of 45-50% with grade 2 DD. She required bilateral thoracocentesis and IV diuresis. She also received blood transfusions with symptomatic improvement. Discharged on 5m Lasix PRN. Her MM drug regimen was held after this hospitalization due to severe cytopenia. Per Dr. NMeda Coffee she will need a repeat 2D echo every 3 months while on this chemo regimen.  I saw her for post hospital follow up on 04/18/16. She was improving but still felt weak. I ordered a TTE in 3 months per Dr. NFrancesca Omanrecommendation. This was performed on 06/27/16 and showed EF down to 25-30% and a highly mobile echodensity attached to the aortic sideof the left coronary leaflet measuring 1 x 0.7 x 0.4 cm. I discussed with Dr. NMeda Coffeewho felt the worsening cardiomyopathy was likely secondary to daratumumab and the patient needed a TEE to further investigate aortic mass.   Daratumumab was restarted on 04/26/2016 and Velcade 1.330mm2 and dexa 2035meekly started on 11/3. Also on Zometa 3.5 mg monthly started on 06/2013 given every 3 months (last 05/03/2016).   I talked to Dr. FenBurr Medico the phone today about this patient. She was recently seen in the office by Dr. FenBurr Medico 06/28/16 and reported a productive cough x2-3 weeks. She was started on Levaquin and her daratumumab was held with plans to restart after the holidays after she had recovered from possible infection. She is currently in a relapse of her MM but had been responding quite well to treatment despite recurrent PNA. Dr. FenBurr Medicoels like she has at least 1 year  to live and thought TEE was appropriate if necessary. She also was willing to hold Daratumumab to see if her EF would improve.   Today she presents to clinic for follow up. She has stopped Levaquin due to a rash. She has been feeling quite well. She has been exercising at the gym and doing quite well. No CP or SOB. No LE edema,  orthopnea or PND. No dizziness or syncope. No blood in stool or urine. No palpitations.     Past Medical History:  Diagnosis Date  . CAD (coronary artery disease)    a. inf STEMI (in [place] >>> lytics) >>> LHC (8/15- [place]):  pRCA 60%, mid to dist RCA 50% >>> med rx  . Cord compression ([place]) 07/13/12   MRI- diffuse myeloma involvement of T-L spine  . History of radiation therapy 07/13/12-07/27/12   spinal cord compression T3-T10,left scapula  . Hx of echocardiogram    Echo (03/02/14-done at [facility]):  Normal LVEF without significant regional wall motion abnormalities. Mild   . Multiple myeloma ([place]) 07/01/2012  . OSTEOPOROSIS 06/11/2010   Multiple compression fractures; and spontaneous fracture of sternum Qualifier: Diagnosis of  By: [name], [name]     . Thoracic kyphosis 07/13/12   per MRI scan  . Unspecified deficiency anemia     Past Surgical History:  Procedure Laterality Date  . APPENDECTOMY    . CESAREAN SECTION     x2   . COLONOSCOPY  2007   neg with Dr. [name]  . ELBOW SURGERY    . HIP SURGERY  2009   left  . TUBAL LIGATION      Current Medications: Outpatient Medications Prior to Visit  Medication Sig Dispense Refill  . acetaminophen (TYLENOL) 500 MG tablet Take 500 mg by mouth every 6 (six) hours as needed for moderate pain (pain). Reported on 07/20/2015    . acyclovir (ZOVIRAX) 400 MG tablet Take 1 tablet (400 mg total) by mouth 2 (two) times daily. 60 tablet 11  . atorvastatin (LIPITOR) 40 MG tablet TAKE 1 TABLET (40 MG TOTAL) BY MOUTH DAILY. 90 tablet 1  . calcium carbonate (OS-CAL) 600 MG TABS tablet Take 600 mg by mouth daily.    . clopidogrel (PLAVIX) 75 MG tablet Take 1 tablet (75 mg total) by mouth daily. 90 tablet 3  . dexamethasone (DECADRON) 4 MG tablet Take 20 mg by mouth once a week.    . doxycycline (VIBRAMYCIN) 100 MG capsule Take 1 capsule (100 mg total) by mouth 2 (two) times daily. 14 capsule 0    . fluticasone (FLONASE) 50 MCG/ACT nasal spray Place 2 sprays into both nostrils daily. 16 g 1  . metoprolol succinate (TOPROL-XL) 25 MG 24 hr tablet Take 1 tablet (25 mg total) by mouth daily. 90 tablet 3  . nitroGLYCERIN (NITROSTAT) 0.4 MG SL tablet Place under the tongue as needed. Reported on 07/20/2015    . ondansetron (ZOFRAN) 8 MG tablet Take 1 tablet (8 mg total) by mouth 2 (two) times daily as needed (Nausea or vomiting). 30 tablet 1  . PROAIR HFA 108 (90 BASE) MCG/ACT inhaler Inhale 2 puffs into the lungs every 6 (six) hours as needed.  2  . prochlorperazine (COMPAZINE) 10 MG tablet Take 1 tablet (10 mg total) by mouth every 6 (six) hours as needed (Nausea or vomiting). 30 tablet 1   Facility-Administered Medications Prior to Visit  Medication Dose Route Frequency Provider Last Rate Last Dose  .  0.9 %  sodium chloride infusion  250 mL Intravenous Once [name], MD      . 0.9 %  sodium chloride infusion  250 mL Intravenous Once [name], MD      . acetaminophen (TYLENOL) tablet 650 mg  650 mg Oral Once [name], MD      . acetaminophen (TYLENOL) tablet 650 mg  650 mg Oral Once [name], MD      . furosemide (LASIX) injection 10 mg  10 mg Intravenous Once [name], MD      . furosemide (LASIX) injection 10 mg  10 mg Intravenous Once [name], MD      . heparin lock flush 100 unit/mL  500 Units Intracatheter Daily PRN [name], MD      . heparin lock flush 100 unit/mL  250 Units Intracatheter PRN [name], MD      . heparin lock flush 100 unit/mL  500 Units Intracatheter Daily PRN [name], MD      . heparin lock flush 100 unit/mL  250 Units Intracatheter PRN [name], MD      . sodium chloride flush (NS) 0.9 % injection 10 mL  10 mL Intracatheter PRN [name], MD      . sodium chloride flush (NS) 0.9 % injection 10 mL  10 mL Intracatheter PRN [name], MD      . sodium chloride flush (NS) 0.9 % injection 10 mL  10 mL Intracatheter PRN [name], MD      . sodium chloride flush (NS) 0.9 %  injection 3 mL  3 mL Intracatheter PRN [name], MD      . sodium chloride flush (NS) 0.9 % injection 3 mL  3 mL Intracatheter PRN [name], MD         Allergies:   Zosyn [piperacillin sod-tazobactam so]; Levaquin [levofloxacin in d5w]; Other; Piperacillin-tazobactam in dex; Zithromax [azithromycin]; and Ciprofloxacin   Social History   Social History  . Marital status: Married    Spouse name: Fritz Pickerel  . Number of children: 2  . Years of education: N/A   Occupational History  . Retired-Pyschology Faculty [employer]   Social History Main Topics  . Smoking status: Never Smoker  . Smokeless tobacco: Never Used  . Alcohol use 3.5 oz/week    7 Standard drinks or equivalent per week     Comment: 1 glass of wine a day  . Drug use: No  . Sexual activity: Not Asked   Other Topics Concern  . None   Social History Narrative   Health Care POA:    Emergency Contact: spouse, Matsue Strom [phone]   End of Life Plan:    Who lives with you: spouse, 2 story home   Any pets: Dog, "AKU"   Diet: Patient has a varied diet of protein, vegetables, starches.  Limits red meats.   Exercise: Patient exercises atleast 3 times a week for over an hour.   Seatbelts: Patient reports wearing her seatbelt when in vehicle.    Nancy Fetter Exposure/Protection: Patient reports wearing sunscreen daily.   Hobbies: Basketball, gardening, swimming, reading, biking           Family History:  The patient's family history includes Cancer in her brother; Glaucoma in her mother; Heart disease in her father and mother.     ROS:   Please see the history of present illness.    ROS All other systems reviewed and are negative.   PHYSICAL EXAM:  VS:  BP 106/68   Pulse 98   Ht 5' 6"  (1.676 m)   Wt 122 lb (55.3 kg)   BMI 19.69 kg/m    GEN: Well nourished, well developed, in no acute distress  HEENT: normal  Neck: no JVD, carotid bruits, or masses Cardiac: RRR; no murmurs, rubs, or gallops,no edema    Respiratory:  clear to auscultation bilaterally, normal work of breathing GI: soft, nontender, nondistended, + BS MS: no deformity or atrophy  Skin: warm and dry, no rash Neuro:  Alert and Oriented x 3, Strength and sensation are intact Psych: euthymic mood, full affect  Wt Readings from Last 3 Encounters:  07/02/16 122 lb (55.3 kg)  06/28/16 123 lb 12.8 oz (56.2 kg)  06/14/16 122 lb 8 oz (55.6 kg)      Studies/Labs Reviewed:   EKG:  EKG is NOT ordered today.   Recent Labs: 03/16/2016: Magnesium 1.7 03/25/2016: B Natriuretic Peptide 188.9 06/28/2016: ALT 16; BUN 19.0; Creatinine 0.7; HGB 10.1; Platelets 114; Potassium 3.9; Sodium 140   Lipid Panel    Component Value Date/Time   CHOL 207 (H) 06/02/2009 2018   TRIG 68 06/02/2009 2018   HDL 73 06/02/2009 2018   CHOLHDL 2.8 Ratio 06/02/2009 2018   VLDL 14 06/02/2009 2018   [place] 120 (H) 06/02/2009 2018    Additional studies/ records that were reviewed today include:  2D ECHO: 03/24/2016 LV EF: 45% -   50% Study Conclusions - Left ventricle: The cavity size was normal. Wall thickness was   normal. Systolic function was mildly reduced. The estimated   ejection fraction was in the range of 45% to 50%. Features are   consistent with a pseudonormal left ventricular filling pattern,   with concomitant abnormal relaxation and increased filling   pressure (grade 2 diastolic dysfunction). - Mitral valve: There was mild regurgitation. - Left atrium: The atrium was mildly dilated.  2D ECHO: 06/27/2016 LV EF: 25% -   30% Study Conclusions - Left ventricle: The cavity size was mildly dilated. Systolic   function was severely reduced. The estimated ejection fraction   was in the range of 25% to 30%. Diffuse hypokinesis. Doppler   parameters are consistent with abnormal left ventricular   relaxation (grade 1 diastolic dysfunction). Doppler parameters   are consistent with intermediate end-diastolic filling pressure. - Aortic  valve: Trileaflet; normal thickness leaflets. There was   trivial regurgitation. - Aortic root: The aortic root was normal in size. - Mitral valve: Structurally normal valve. There was mild   regurgitation. - Right ventricle: The cavity size was normal. Wall thickness was   normal. Systolic function was normal. - Right atrium: The atrium was normal in size. - Tricuspid valve: There was trivial regurgitation. - Pulmonic valve: There was trivial regurgitation. - Pulmonary arteries: Systolic pressure was within the normal   range. - Inferior vena cava: The vessel was dilated. The respirophasic   diameter changes were blunted (< 50%), consistent with elevated   central venous pressure. - Pericardium, extracardiac: There was no pericardial effusion. Impressions: - LVEF is severely decreased estimated at 25-30% with diffuse   hypokinesis.   There is a highly mobile echodensity attached to the aortic side   of the left coronary leaflet measuring 1 x 0.7 x 0.4 cm. The   differential diagnosis includes inferctious endocarditis,   marantic endocarditis, thrombus, papillary fibroelastoma. Further   evaluation with a TEE is recommended.   ASSESSMENT & PLAN:   Shannon Rush is a  75 y.o. female with a history of multiple myeloma s/p bone marrow transplant (followed at [facility] by Dr. [name] and in [place] by Dr. Juliann Mule), osteoporosis, HLD and CAD: inferior STEMI s/p thrombolytics (02/2014) who presents to clinic for follow up.   Chronic systolic CHF: EF 38-75% in 03/2016. Repeat ECHO last week showed EF down to 25-30%. Discussed with Dr. Meda Coffee who felt this was likely from daratumumab. Discussed case with Dr. Burr Medico and we will continue to hold Daratumumab at this time. No s/s CHF. Continue Toprol XL. Has lasix if she needs it.   Aortic mobile echodensity: attached to the aortic side of the left coronary leaflet measuring 1 x 0.7 x 0.4 cm. At first plan was to perform a TEE but then review of  previous echo from [place] in 2014 showed linear mass on aortic valve that is likely the same mass that has been stable over time: ?  Lambl's excrescence. Discussed with Dr. Meda Coffee and now no need for TEE.   Multiple myeloma s/p bone marrow transplant: (followed at [facility] by Dr. [name] and in [facility] by Dr. Burr Medico). Holding Daratumumab   HLD: continue statin  CAD: inferior STEMI s/p thrombolytics (02/2014). Plavix recent discontinued 2/2 thrombocytopenia but it was resumed at discharge. Continue plavix, statin and BB   Medication Adjustments/Labs and Tests Ordered: Current medicines are reviewed at length with the patient today.  Concerns regarding medicines are outlined above.  Medication changes, Labs and Tests ordered today are listed in the Patient Instructions below. Patient Instructions  Medication Instructions:  Your physician recommends that you continue on your current medications as directed. Please refer to the Current Medication list given to you today.   Labwork: None ordered  Testing/Procedures: None ordered  Follow-Up: Your physician recommends that you schedule a follow-up appointment in: [place]   Any Other Special Instructions Will Be Listed Below (If Applicable).     If you need a refill on your cardiac medications before your next appointment, please call your pharmacy.      Signed, Angelena Form, PA-C  07/02/2016 3:18 PM    [facility] [address], [place], [place]  [address] Phone: [phone]; Fax: [phone]

## 2016-07-01 ENCOUNTER — Telehealth: Payer: Self-pay | Admitting: *Deleted

## 2016-07-01 LAB — KAPPA/LAMBDA LIGHT CHAINS
IG LAMBDA FREE LIGHT CHAIN: 6 mg/L (ref 5.7–26.3)
Ig Kappa Free Light Chain: 5.2 mg/L (ref 3.3–19.4)
KAPPA/LAMBDA FLC RATIO: 0.87 (ref 0.26–1.65)

## 2016-07-01 NOTE — Telephone Encounter (Signed)
Received vm call from pt stating that she took ATB on Fri pm & in 1-2 hours felt funny in face & neck but never developed a rash but stopped ATB.  Returned call to pt & she states she felt warm & prickly in her face & as time went by she felt hotter & redder & this moved to her upper body, neck & back.  She states no rash but felt achy & ran low grade fever @ 100.  She didn't sleep well that night.  She reports her cough is better although still coughing some & productive of white-yellow sputum.  Discussed with Dr Burr Medico & she thinks pt should take benadryl 25 mg @ 30 " before she takes the Levaquin & try taking ATB again & if symptoms develop again to stop. Message left on pt's vm to call back if needed.

## 2016-07-02 ENCOUNTER — Encounter: Payer: Self-pay | Admitting: Physician Assistant

## 2016-07-02 ENCOUNTER — Other Ambulatory Visit: Payer: Self-pay | Admitting: Hematology

## 2016-07-02 ENCOUNTER — Ambulatory Visit (INDEPENDENT_AMBULATORY_CARE_PROVIDER_SITE_OTHER): Payer: Medicare Other | Admitting: Physician Assistant

## 2016-07-02 ENCOUNTER — Telehealth: Payer: Self-pay

## 2016-07-02 VITALS — BP 106/68 | HR 98 | Ht 66.0 in | Wt 122.0 lb

## 2016-07-02 DIAGNOSIS — C9002 Multiple myeloma in relapse: Secondary | ICD-10-CM | POA: Diagnosis not present

## 2016-07-02 DIAGNOSIS — E785 Hyperlipidemia, unspecified: Secondary | ICD-10-CM

## 2016-07-02 DIAGNOSIS — I2510 Atherosclerotic heart disease of native coronary artery without angina pectoris: Secondary | ICD-10-CM | POA: Diagnosis not present

## 2016-07-02 MED ORDER — DOXYCYCLINE HYCLATE 100 MG PO CAPS: 100.0000 mg | ORAL_CAPSULE | Freq: Two times a day (BID) | ORAL | 0 refills | Status: DC

## 2016-07-02 NOTE — Telephone Encounter (Signed)
Pt called saying she took the antibiotic again after benadryl. She was more red, lasting longer, sore and itchy. She has used 4 benadryl total, and some tylenol. Her skin is tender. Muscles are a little achey. She will stop levaquin. What next?

## 2016-07-02 NOTE — Telephone Encounter (Signed)
I called pt back, and recommend her stop Levaquin which she did, and I will switch her to doxycycline 100 mg twice daily for 7 days. Prescription was called into her pharmacy today.  I spoke with her cardiologist this morning, she has appointment with them this afternoon. Her echo showed decreased EF to 20-25%, and a possible regurgitation and aortic well. She'll get blood culture today in the office, and we'll follow the culture results. I'll hold on all her multiple myeloma treatment for now.   [name] MD

## 2016-07-02 NOTE — Patient Instructions (Signed)
Medication Instructions:  Your physician recommends that you continue on your current medications as directed. Please refer to the Current Medication list given to you today.   Labwork: None ordered  Testing/Procedures: None ordered  Follow-Up: Your physician recommends that you schedule a follow-up appointment in: [place]   Any Other Special Instructions Will Be Listed Below (If Applicable).     If you need a refill on your cardiac medications before your next appointment, please call your pharmacy.

## 2016-07-03 LAB — MULTIPLE MYELOMA PANEL, SERUM
ALBUMIN/GLOB SERPL: 2 — AB (ref 0.7–1.7)
Albumin SerPl Elph-Mcnc: 3.8 g/dL (ref 2.9–4.4)
Alpha 1: 0.3 g/dL (ref 0.0–0.4)
Alpha2 Glob SerPl Elph-Mcnc: 0.6 g/dL (ref 0.4–1.0)
B-Globulin SerPl Elph-Mcnc: 0.8 g/dL (ref 0.7–1.3)
Gamma Glob SerPl Elph-Mcnc: 0.4 g/dL (ref 0.4–1.8)
Globulin, Total: 2 g/dL — ABNORMAL LOW (ref 2.2–3.9)
IGA/IMMUNOGLOBULIN A, SERUM: 7 mg/dL — AB (ref 64–422)
IGM (IMMUNOGLOBIN M), SRM: 22 mg/dL — AB (ref 26–217)
IgG, Qn, Serum: 463 mg/dL — ABNORMAL LOW (ref 700–1600)
M Protein SerPl Elph-Mcnc: 0.1 g/dL — ABNORMAL HIGH
Total Protein: 5.8 g/dL — ABNORMAL LOW (ref 6.0–8.5)

## 2016-07-05 ENCOUNTER — Telehealth: Payer: Self-pay | Admitting: Hematology

## 2016-07-05 NOTE — Telephone Encounter (Signed)
Left message re January appointments. Schedule mailed.

## 2016-07-11 ENCOUNTER — Telehealth: Payer: Self-pay | Admitting: Hematology

## 2016-07-11 NOTE — Telephone Encounter (Signed)
Pt called to r/s 1/9 appt date/time to 1/11. Pt has new appt date/time

## 2016-07-18 ENCOUNTER — Telehealth: Payer: Self-pay | Admitting: *Deleted

## 2016-07-18 DIAGNOSIS — Z79899 Other long term (current) drug therapy: Secondary | ICD-10-CM

## 2016-07-18 DIAGNOSIS — I429 Cardiomyopathy, unspecified: Secondary | ICD-10-CM

## 2016-07-18 MED ORDER — LISINOPRIL 2.5 MG PO TABS: 2.5000 mg | ORAL_TABLET | Freq: Every day | ORAL | 3 refills | Status: DC

## 2016-07-18 NOTE — Telephone Encounter (Signed)
Pts echo is scheduled for 08/26/16 at [facility] location.  Pt made aware of appt date and time by [facility] scheduling.

## 2016-07-18 NOTE — Telephone Encounter (Signed)
-----   Message from [name], MD sent at 07/18/2016  1:36 PM EST ----- Dr Burr Medico, Thank you on following, I was not planning to do a TEE since she had that mobile echodensity on an echo few years ago. I am seeing her on 07/24/16, I will repeat her echocardiogram begging of February [facility] please schedule). I will start lisinopril 2.5 mg po daily for now. I will follow her every 4-6 months. Shannon Rush   ----- Message ----- From: [name], MD Sent: 07/15/2016   3:40 PM To: [name], MD, #  Dr. Meda Coffee,  Her recent echo showed decreased EF. It think it could be related to Velcade (reported CHF <1% but it has other cardiac toxicities also), less likely daratumumab. I understand that you do not plan to do TEE.   I plan to restart Dara on 1/11 (about 4 weeks after her last dose), but will hold on her velcade and dexamethasone. Let me know if you have any concerns.   When do you plan to repeat her echo? I think we need to monitor her heart closely when she is on daratumumab, although CHF has not been reported.   Thanks  [name]

## 2016-07-18 NOTE — Telephone Encounter (Signed)
Notified the pt that per Dr Meda Coffee, she spoke with Dr Burr Medico about her recent echo and restarting Dara on 1/11.  Informed the pt of Dr Francesca Oman recommendations to both her and Dr Burr Medico,  Is as mentioned below:   I was not planning to do a TEE since she had that mobile echodensity on an echo few years ago.  I am seeing her on 07/24/16, I will repeat her echocardiogram beginning of February [facility] Einstein please schedule).  I will start lisinopril 2.5 mg po daily for now.  I will follow her every 4-6 months.   Informed the pt that I will place the order for the echo in the system, and have a [facility] scheduler call her back to have this arranged for the beginning of Feb.  Confirmed the pharmacy of choice where the pt would like for her lisinopril to be sent too.   Informed the pt that we will see her as planned for next week 07/24/16 at 1045 with Dr Meda Coffee.   Informed the pt that Dr Meda Coffee will follow her every 4-6 months, as mentioned this to Dr Burr Medico as well.   Pt verbalized understanding and agrees with this plan.

## 2016-07-19 ENCOUNTER — Other Ambulatory Visit: Payer: Self-pay | Admitting: Cardiovascular Disease

## 2016-07-23 ENCOUNTER — Other Ambulatory Visit: Payer: Medicare Other

## 2016-07-23 ENCOUNTER — Ambulatory Visit: Payer: Medicare Other

## 2016-07-23 ENCOUNTER — Ambulatory Visit: Payer: Medicare Other | Admitting: Hematology

## 2016-07-24 ENCOUNTER — Ambulatory Visit (INDEPENDENT_AMBULATORY_CARE_PROVIDER_SITE_OTHER): Payer: Medicare Other | Admitting: Cardiology

## 2016-07-24 VITALS — BP 122/68 | HR 84 | Ht 66.0 in | Wt 124.0 lb

## 2016-07-24 DIAGNOSIS — I5023 Acute on chronic systolic (congestive) heart failure: Secondary | ICD-10-CM | POA: Diagnosis not present

## 2016-07-24 DIAGNOSIS — I429 Cardiomyopathy, unspecified: Secondary | ICD-10-CM

## 2016-07-24 DIAGNOSIS — I359 Nonrheumatic aortic valve disorder, unspecified: Secondary | ICD-10-CM

## 2016-07-24 DIAGNOSIS — C9002 Multiple myeloma in relapse: Secondary | ICD-10-CM

## 2016-07-24 DIAGNOSIS — E785 Hyperlipidemia, unspecified: Secondary | ICD-10-CM

## 2016-07-24 DIAGNOSIS — Z79899 Other long term (current) drug therapy: Secondary | ICD-10-CM

## 2016-07-24 DIAGNOSIS — I358 Other nonrheumatic aortic valve disorders: Secondary | ICD-10-CM

## 2016-07-24 MED ORDER — ASPIRIN EC 81 MG PO TBEC: 81.0000 mg | DELAYED_RELEASE_TABLET | Freq: Every day | ORAL | 3 refills | Status: DC

## 2016-07-24 NOTE — Progress Notes (Signed)
[place], [place] [employer] Shannon Rush [id number]  DIAGNOSIS: Multiple myeloma with relapse  CHIEF COMPLAINS: Follow-up of multiple myeloma  CURRENT TREATMENT:   1. Pomalidomide 4 mg daily on day 1-21, dexamethasone 40 mg on day 1, 8 and 15, every 28 days, started on 02/27/2016, daratumumab weekly started on 03/07/2016, treatment on hold since 03/09/2016 due to hospitalization and severe cytopenia 2. Daratumumab restarted on 04/26/2016, Velcade 1.69m/m2 and dexa 243mweekly start on 11/3, velcade held since 06/28/2016 due to CHF  2. Zometa 3.5 mg monthly started on 06/2013, on every 3 months now, last 05/03/2016   Oncology History   [facility]  Staging form: Multiple Myeloma, AJCC 6th Edition   - Clinical: Stage IIIA - Signed by Shannon Rush on 09/04/2013      [facility]  05/26/2012 Initial Diagnosis    Presenting IgG was 4,040 mg/dL on 06/05/2012 (IgA 35; IgM 34); kappa free light chain 34.4 mg/dL, lambda 0.00, kappa:lambda ratio of 34.75; SPEP with M-spike of 2.60.      06/30/2012 Imaging    Numerous lytic lesions throughout the calvarium.  Lytic lesion within the left lateral scapula.  Findings most compatible with metastases or myeloma. Multiple mid and lower thoracic compression fractures.  Slight compression through the endplates at L3.      [id number]/[id number] Marrow Biopsy    Bone marrow biopsy showed 49% plasma cell. Normal classical cytogenetics; however, myeloma FISH panel showed 13q- (intermediate risk)          07/14/2012 - 07/27/2012 Radiation Therapy    Palliative radiation 20 Gy over 10 fractions between to thoracic spine cord compression and symptomatic left scapula lytic lesion.      08/10/2012 -  Chemotherapy    Started SQ Velcade once weekly, 3 weeks on, 1 weeks off; daily Revlimid d1-21, 7 days off; and Dexamethasone 4023mO weekly.       02/12/2013 Bone  Marrow Transplant    [facility]     06/14/2013 Tumor Marker    IgG  1830,  Kappa:lamba ratio, 1.69 (baseline was 34.75 or   M-spike 0.28 (baseline of 2.6 or  89.3% of baseline).        06/25/2013 -  Chemotherapy    Started zometa 3.5 mg monthly       09/01/2013 -  Chemotherapy    Maintenance therapy with revlimid 5mg37mily for 14 days and then off for 7 (decreased from 21 days on and 7 days off based on neutropenia).       09/13/2013 - 09/17/2014 Hospital Admission    Hospital admission for pneumonia      12/20/2013 Treatment Plan Change    Maintenance therapy decreased to 2.5 mg daily for 21 days and then off for 7 days based on low counts.       01/25/2014 Tumor Marker    IgG 1360 M spike 0.5      03/01/2014 - 03/05/2014 [facility] received thromb[facility] therapy, cath showed 60% prox RCA, mid-distal RCA 50%, Echo normal, no stents.      04/11/2014 - 08/07/2014 Chemotherapy    Continue Revlimid at 2.5 mg 3 weeks on 1 week off      08/08/2014 - 08/13/2014 Hospital Admission    Hospital admission for pneumonia. Her Revlimid was held.  08/29/2014 - 09/14/2014 Chemotherapy    Maintenance Revlimid restarted      09/14/2014 - 09/17/2014 Hospital Admission    Admitted for pneumonia after coming back from a trip in [name]. Revlimid was held again.      11/13/2014 - 12/25/2015 Chemotherapy    Maintenance Revlimid restarted, changed to 2.26m daily, 2 weks on, 1 week off from 12/15/2014, stopped due to disease progression       01/24/2016 Progression    Patient is M protein has gradually increased to 1.0g, repeated a bone marrow biopsy showed plasma cell 10-30%      02/27/2016 -  Chemotherapy    Pomalidomide 4 mg daily on day 1-21, dexamethasone 40 mg on day 1, 8 and 15, every 28 days, started on 02/27/2016, daratumumab weekly started on 03/07/2016, held after first dose, due to  hospitalization and a severe pancytopenia      03/10/2016 - 03/29/2016 Hospital Admission    Pt was admitted for sepsis from pneumonia, and severe pancytopenia. She required ICU stay for a few days due to hypotension, developed b/l pleural effusion required diuretics and b/l thoracentesis. She also required blood and plt transfusion, and prolonged neupogen injection for severe neutropenia. She was discharged home after 3 weeks hospital stay       04/26/2016 -  Chemotherapy    Daratumumab restarted on 04/26/2016, Velcade 1.368mm2 and dexa 2079meekly start on 11/3      INTERVAL HISTORY:  ClaJAMIAYA Rush 36o. female with a past medical history of MM as detailed above, s/p SCT is here for follow-up. She has been seeing her cardiologist Dr. NelMeda Coffeetely due to her worsening CHF, her recent repeated ECHO showed EF 25-30%, She denies any significant dyspnea, but is still has mild cough with clear mucus production. No fever or chills. No orthopnea. Dr. NelMeda Coffees started her on low-dose lisinopril, and plan to repeat echo next month. She otherwise is doing well, has good appetite and energy level. No fever or chills.  MEDICAL HISTORY: Past Medical History:  Diagnosis Date  . CAD (coronary artery disease)    [place] [facility]%, mid to dist RCA 50% >>> med rx  . [place]   MRI- diffuse myeloma involvement of T-L spine  . History of radiation therapy 07/13/12-07/27/12   spinal cord compression T3-T10,left scapula  . Hx of echocardiogram    [facility] Normal LVEF without significant regional wall motion abnormalities. Mild   . [facility]  . OSTEOPOROSIS 06/11/2010   Multiple compression fractures; and spontaneous fracture of sternum Qualifier: Diagnosis of  By: SaxZebedee Iba, SusManuela Schwartz  . Thoracic kyphosis 07/13/12   per MRI scan  .  Unspecified deficiency anemia      ALLERGIES:  is allergic to zosyn [piperacillin sod-tazobactam so]; levaquin [levofloxacin in d5w]; other; piperacillin-tazobactam in dex; zithromax [azithromycin]; and ciprofloxacin.  MEDICATIONS: has a current medication list which includes the following prescription(s): acetaminophen, acyclovir, aspirin ec, atorvastatin, calcium carbonate, dexamethasone, fluticasone, lisinopril, metoprolol succinate, nitroglycerin, ondansetron, proair hfa, and prochlorperazine, and the following Facility-Administered Medications: sodium chloride, sodium chloride, acetaminophen, acetaminophen, furosemide, furosemide, heparin lock flush, heparin lock flush, heparin lock flush, heparin lock flush, sodium chloride flush, sodium chloride flush, sodium chloride flush, sodium chloride flush, and sodium chloride flush.  SURGICAL HISTORY:  Past Surgical History:  Procedure Laterality Date  .  APPENDECTOMY    . CESAREAN SECTION     x2   . COLONOSCOPY  2007   neg with Dr. [name]  . ELBOW SURGERY    . HIP SURGERY  2009   left  . TUBAL LIGATION      REVIEW OF SYSTEMS:   Constitutional: Denies abnormal weight loss Eyes: Denies blurriness of vision Ears, nose, mouth, throat, and face: Denies mucositis or sore throat,  Respiratory: Denies dyspnea or wheezes  Cardiovascular: Denies palpitation, chest discomfort  Gastrointestinal:  Denies nausea, heartburn or change in bowel habits Skin: Denies abnormal skin rashes Lymphatics: Denies new lymphadenopathy or easy bruising Neurological:Denies numbness, tingling or new weaknesses Behavioral/Psych: Mood is stable, no new changes  All other systems were reviewed with the patient and are negative.   PHYSICAL EXAMINATION: ECOG PERFORMANCE STATUS: 1 Blood pressure 119/66, pulse 78, temperature 97.9 F (36.6 C), temperature source Oral, resp. rate 18, height 5' 6" (1.676 m), weight 128 lb 8 oz (58.3 kg), SpO2 95 %. GENERAL:alert, no  distress and comfortable; easily mobile to exam table; kyphosis, moderate.  SKIN: skin color, texture, turgor are normal, no rashes or significant lesions except a small healing, dry wound in the right lower leg above his ankle, with surrounding skin erythema and swelling EYES: normal, Conjunctiva are pink, 1 tear/injectionof blood vessel left eye in sclera. OROPHARYNX:no exudate, no erythema and lips, buccal mucosa, and tongue normal, no ONJ NECK: supple, thyroid normal size, non-tender, without nodularity; nodules cervical bilaterally.  LYMPH:  no palpable lymphadenopathy in the cervical, axillary or supraclavicular LUNGS: crackles at the bases bilaterally with normal breathing effort, no wheezes or rhonchi HEART: regular rate & rhythm and no murmurs  ABDOMEN:abdomen soft, non-tender and normal bowel sounds Musculoskeletal:no cyanosis of digits and no clubbing, (+) spine scoliosis  NEURO: alert & oriented x 3 with fluent speech, no focal motor/sensory deficits   Labs:  CBC Latest Ref Rng & Units 07/25/2016 06/28/2016 06/21/2016  WBC 3.9 - 10.3 10e3/uL 3.3(L) 3.5(L) 3.3(L)  Hemoglobin 11.6 - 15.9 g/dL 11.0(L) 10.1(L) 11.3(L)  Hematocrit 34.8 - 46.6 % 33.4(L) 30.8(L) 34.7(L)  Platelets 145 - 400 10e3/uL 128(L) 114(L) 152    CMP Latest Ref Rng & Units 06/28/2016 06/28/2016 06/21/2016  Glucose 70 - 140 mg/dl 105 - 105  BUN 7.0 - 26.0 mg/dL 19.0 - 15.3  Creatinine 0.6 - 1.1 mg/dL 0.7 - 0.8  Sodium 136 - 145 mEq/L 140 - 142  Potassium 3.5 - 5.1 mEq/L 3.9 - 3.5  Chloride 101 - 111 mmol/L - - -  CO2 22 - 29 mEq/L 23 - 24  Calcium 8.4 - 10.4 mg/dL 8.9 - 9.1  Total Protein 6.4 - 8.3 g/dL 5.9(L) 5.8(L) 6.3(L)  Total Bilirubin 0.20 - 1.20 mg/dL 0.57 - 0.62  Alkaline Phos 40 - 150 U/L 102 - 138  AST 5 - 34 U/L 17 - 17  ALT 0 - 55 U/L 16 - 16   ANC 2.6K  SPEP M-PROTEIN 10/01/2013: 0.18 (nadir)  10/13/2014: 0.6 11/21/2014: 0.5 01/30/2015: 0.6 03/13/2015: 0.6 04/20/2015: 0.5 06/01/2015:  0.5 07/20/2015: 0.7 09/14/2015: 0.7 10/26/2015: 0.8 12/07/2015: 1.0 02/07/2016: 1.1  04/03/2016: 0.2 05/03/2016: 0.2  05/31/2016: 0.1 06/28/2016: 0.1   IgG ([phone]) 10/13/2014: 1710 11/21/2014: 1460 01/30/2015: 1380 03/13/15: 1570 04/20/2015: 1550 06/01/2015: 1590 07/22/2015: 1770 09/14/2015: 1787 10/26/2015: 1898 12/07/2015: 2045 02/07/2016: 1883 04/03/2016: 787 05/03/2016: 685  05/31/2016: 544 06/28/2016: 463  Serum kappa, lambda light chains, and ratio  11/21/2014: 3.95, 2.08, 1.90  01/30/2015: 3.92, 2.64, 1.48  03/13/2015: 3.55, 2.17, 1.64 04/20/15: 4.52, 1.78, 2.54 06/01/2015: 4.82, 1.75, 2.75 07/20/2015: 8.71, 2.43, 3.58 09/14/2015: 9.17, 2.02, 4.55 10/26/2015: 11.4, 1.75, 6.42 12/07/2015: 1.54, 1.83, 8.44 02/07/2016: 1.93, 1.42, 13.6 04/03/2016: 0.96, 0.95, 1.01  05/03/2016: 0.71, 0.34, 2.09  05/31/2016: 0.48, 0.26, 1.85 06/28/2016: 0.52, 0.60, 0.87  24 hr urine PEP (total protein, M-spike) 10/13/2014: (-) 02/12/2016: 24m, 665m9/22/2017: 114 mg, (-)  PATHOLOGY REPORT  Diagnosis 01/24/2016 Bone Marrow, Aspirate,Biopsy, and Clot, RT iliac and core - VARIABLY CELLULAR BONE MARROW WITH PLASMA CELL NEOPLASM. - SEE COMMENT. PERIPHERAL BLOOD: - MACROCYTIC ANEMIA. Diagnosis Note The bone marrow is variably cellular with trilineage hematopoiesis and non specific myeloid changes. In this background, the plasma cell component is variable ranging from less than 10 % in many areas to 30% in some aspirate particles with relatively low cellularity. Foci of interstitial infiltrates and small clusters are also seen in the clot section. Immunohistochemical stains show kappa light chain restriction in atypical plasma cell clusters and infiltrates consistent with plasma cell neoplasm. Correlation with cytogenetic and FISH studies is recommended. (BNS:gt, 01/2016-07-16  RADIOGRAPHIC STUDIES: None  ASSESSMENT: Shannon Rush.o. female with a history of Multiple Myeloma s/p autotransplant in August  2014 followed by maintenence low dose Revlimid therapy, and relapsed in 01/2016  PLAN:   1. IgG kappa Multiple myeloma s/p Auto SCT, intermediate risk, relapsed in 01/2016 --MM markers demonstrate a very good initial response (M protein baseline 2.6, down to 0.18 after induction chemo and transplant, stable around 0.5 since July 2015, but has gradually increased lately),  she unfortunately relapsed in July 2017 -I previously discussed her repeated bone marrow biopsy from 02/01/2016, which showed 10-30% plasma cells in her marrow -I agree with Dr. GaKendell Baneecommendation to stop revlimid and change to Daratumumab, Pomalidomide and dexamethasone due to her disease relapse. I don't think she has resistance to Revlimid, she has been on very small dose of Revlimid due to her prior history of multiple infections. -She started first cycle DaraPD in 02/2016, but in 2 weeks she developed severe pancytopenia, pneumonia with sepsis required 3 weeks hospital stay. She did have a very nice response to the treatment, her M-protein has significantly reduced from 1.2 to 0.2, light chain levels normalized even with half cycle treatment  -I recommended her to change regiment to DaraVD, and started her on dara every 2 weeks for 2 doses, she tolerated this very well -Her repeated echo in December 2070 showed a significant drop on EF, she is not much symptomatically clinically for CHF, due to the potential contribution of Velcade to her CHF, I'll hold her Velcade for now. She is scheduled to repeat echo next month -Significant cardiotoxicity including CHF has not been reported [place] continue every 2 weeks, along with dexamethasone 20 mg every other week -lab reviewed, mild leukocytopenia, and anemia, all stable, will proceed dexa and dara today, hold velcade   2. Pancytopenia  -secondary to chemo  -Mild leukopenia, no neutropenia, mild anemia stable -Continue monitoring closely  3. CHF, CAD, inferior STEMI in  02/2014 -continue metoprolol and lipitor, Dr. NeMeda Coffeetarted her on lisinopril also -follow up with Dr. NeMeda Rush-her recent echo showed EF 25-30%, significantly lower than before, she is scheduled for repeat echo next month  4. Osteoporosis -recently repeated bone density scan showed worsening osteoporosis, continue zometa, changed to every 3 months after 03/2015.  -she declined repeating DEXA  -continue Zometa every 3 months, next due today  5. Mild productive cough -Possibly related to her CHF, she had allergy reaction to Levaquin, completed a course of doxycycline. No significant improvement.    Plan -restart dexa and daratumumab today, hold Velcade  -Follow up in 2 weeks with Dara treatment   All questions were answered. The patient knows to call the clinic with any problems, questions or concerns. We can certainly see the patient much sooner if necessary.  I spent 20 minutes counseling the patient face to face. The total time spent in the appointment was 25 minutes.    [name]  07/24/2016

## 2016-07-24 NOTE — Patient Instructions (Signed)
Medication Instructions:   STOP TAKING PLAVIX NOW  START TAKING ASPIRIN 81 MG ONCE DAILY     Follow-Up:  WITH DR Meda Coffee IN MID February 2018, AFTER YOUR SCHEDULED ECHO ON 2/12.         If you need a refill on your cardiac medications before your next appointment, please call your pharmacy.

## 2016-07-24 NOTE — Progress Notes (Signed)
Cardiology Office Note    Date:  07/24/2016   ID:  PURVI RUEHL, DOB [birth year], MRN [id number]  PCP:  [name], MD  Cardiologist:  Dr. Meda Coffee  CC: follow up- cardiomyopathy and mass on TTE      History of Present Illness:  Shannon Rush is a 76 y.o. female with a history of multiple myeloma s/p bone marrow transplant (followed at [facility] by Dr. [name] and in [place] by Dr. Juliann Mule), osteoporosis, HLD and CAD: inferior STEMI s/p thrombolytics (02/2014) who presents to clinic for follow up.   Patient was seen by Dr. Johnsie Cancel in 12/2012. There were reports of an abnormal echocardiogram. 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   Admitted 8/18-8/22/15 to the [facility] with an inferior STEMI. She received thrombolytics. Cardiac catheterization demonstrated 60% proximal RCA, mid to distal RCA 50% (lesion segmental and thrombotic). Echocardiogram demonstrated normal LV function. Patient was continued on heparin and aspirin. No intervention was performed. Recommendation was for the patient to return to [facility] and have a nuclear stress test in 6 weeks. Patient was on Revlimid (for pancytopenia from chemotherapy) and the DC summary from the hospital in [place] cites this as a possible cause for her STEMI.   She has a known hx of multiple myeloma since 2013 treated with Revlimid, recently switched to pomalidomide, dexamethasone, daratumumab with resultant pancytopenia. She was admitted from 8/28-9/15/17 for sepsis and acute respiratory failure 2/2 HCAP with acute on chronic systolic CHF with pleural effusions. The pt was shocky and required IVF, transfer to ICU, and IV pressors. She was noted to be thrombocytopenic and her Plavix was stopped. She improved initially but has had persistent hypoxia. Chest CT 03/24/16 showed moderate bilateral effusions.  She was given Lasix but this has been stopped secondary to hypotension. Echo done 03/24/16 showed an EF of 45-50% with grade 2 DD. She required bilateral thoracocentesis and IV diuresis. She also received blood transfusions with symptomatic improvement. Discharged on 48m Lasix PRN. Her MM drug regimen was held after this hospitalization due to severe cytopenia. Per Dr. NMeda Coffee she will need a repeat 2D echo every 3 months while on this chemo regimen.  I saw her for post hospital follow up on 04/18/16. She was improving but still felt weak. I ordered a TTE in 3 months per Dr. NFrancesca Omanrecommendation. This was performed on 06/27/16 and showed EF down to 25-30% and a highly mobile echodensity attached to the aortic sideof the left coronary leaflet measuring 1 x 0.7 x 0.4 cm. I discussed with Dr. NMeda Coffeewho felt the worsening cardiomyopathy was likely secondary to daratumumab and the patient needed a TEE to further investigate aortic mass.   Daratumumab was restarted on 04/26/2016 and Velcade 1.336mm2 and dexa 2068meekly started on 11/3. Also on Zometa 3.5 mg monthly started on 06/2013 given every 3 months (last 05/03/2016).   I talked to Dr. FenBurr Medico the phone today about this patient. She was recently seen in the office by Dr. FenBurr Medico 06/28/16 and reported a productive cough x2-3 weeks. She was started on Levaquin and her daratumumab was held with plans to restart after the holidays after she had recovered from possible infection. She is currently in a relapse of her MM but had been responding quite well to treatment despite recurrent PNA. Dr. FenBurr Medicoels like she has at least 1 year  to live and thought TEE was appropriate if necessary. She also was willing to hold Daratumumab to see if her EF would improve.   Today she presents to clinic for follow up. She has stopped Levaquin due to a rash. She has been feeling quite well. She has been exercising at the gym and doing quite well. No CP or SOB. No LE edema,  orthopnea or PND. No dizziness or syncope. No blood in stool or urine. No palpitations.   07/25/2015 - this is a very delightful patient who is coming back for follow-up after 1 month. In the last months she feels like she has more energy she denies any lower extremity edema she is no shortness of breath and in fact she rode her bike here from [facility]. Because of new further decrease of her LVEF from 45-50% to 25-30% she was started on low-dose of lisinopril and continued on Toprol-XL. She has no side effects from these medications. Dr. Mosetta Putt feels that her decrease in LVEF is secondary to pomalidomide. She denies any chest pain, no orthopnea proximal nocturnal dyspnea no palpitations or syncope.  Past Medical History:  Diagnosis Date  . CAD (coronary artery disease)    a. inf STEMI (in [place] >>> [facility]) >>> LHC (8/[facility]):  pRCA 60%, mid to dist RCA 50% >>> med rx  . Cord compression (HCC) 07/13/12   MRI- diffuse myeloma involvement of T-L spine  . History of radiation therapy 07/13/12-07/27/12   spinal cord compression T3-T10,left scapula  . Hx of echocardiogram    Echo (03/02/14-done at [facility]):  Normal LVEF without significant regional wall motion abnormalities. Mild   . Multiple myeloma (HCC) 07/01/2012  . OSTEOPOROSIS 06/11/2010   Multiple compression fractures; and spontaneous fracture of sternum Qualifier: Diagnosis of  By: Humberto Seals NP, Darl Pikes     . Thoracic kyphosis 07/13/12   per MRI scan  . Unspecified deficiency anemia     Past Surgical History:  Procedure Laterality Date  . APPENDECTOMY    . CESAREAN SECTION     x2   . COLONOSCOPY  2007   neg with Dr. Ewing Schlein  . ELBOW SURGERY    . HIP SURGERY  2009   left  . TUBAL LIGATION      Current Medications: Outpatient Medications Prior to Visit  Medication Sig Dispense Refill  . acetaminophen (TYLENOL) 500 MG tablet Take 500 mg by mouth every 6 (six) hours as needed for  moderate pain (pain). Reported on 07/20/2015    . acyclovir (ZOVIRAX) 400 MG tablet Take 1 tablet (400 mg total) by mouth 2 (two) times daily. 60 tablet 11  . atorvastatin (LIPITOR) 40 MG tablet TAKE 1 TABLET (40 MG TOTAL) BY MOUTH DAILY. 90 tablet 1  . calcium carbonate (OS-CAL) 600 MG TABS tablet Take 600 mg by mouth daily.    . clopidogrel (PLAVIX) 75 MG tablet TAKE 1 TABLET (75 MG TOTAL) BY MOUTH DAILY. 90 tablet 0  . dexamethasone (DECADRON) 4 MG tablet Take 20 mg by mouth once a week.    . fluticasone (FLONASE) 50 MCG/ACT nasal spray Place 2 sprays into both nostrils daily. 16 g 1  . lisinopril (PRINIVIL,ZESTRIL) 2.5 MG tablet Take 1 tablet (2.5 mg total) by mouth daily. 90 tablet 3  . metoprolol succinate (TOPROL-XL) 25 MG 24 hr tablet TAKE 1 TABLET (25 MG TOTAL) BY MOUTH DAILY. 90 tablet 0  . nitroGLYCERIN (NITROSTAT) 0.4 MG SL tablet Place under the  tongue as needed. Reported on 07/20/2015    . ondansetron (ZOFRAN) 8 MG tablet Take 1 tablet (8 mg total) by mouth 2 (two) times daily as needed (Nausea or vomiting). 30 tablet 1  . PROAIR HFA 108 (90 BASE) MCG/ACT inhaler Inhale 2 puffs into the lungs every 6 (six) hours as needed.  2  . prochlorperazine (COMPAZINE) 10 MG tablet Take 1 tablet (10 mg total) by mouth every 6 (six) hours as needed (Nausea or vomiting). 30 tablet 1  . doxycycline (VIBRAMYCIN) 100 MG capsule Take 1 capsule (100 mg total) by mouth 2 (two) times daily. (Patient not taking: Reported on 07/24/2016) 14 capsule 0   Facility-Administered Medications Prior to Visit  Medication Dose Route Frequency Provider Last Rate Last Dose  . 0.9 %  sodium chloride infusion  250 mL Intravenous Once [name], MD      . 0.9 %  sodium chloride infusion  250 mL Intravenous Once [name], MD      . acetaminophen (TYLENOL) tablet 650 mg  650 mg Oral Once [name], MD      . acetaminophen (TYLENOL) tablet 650 mg  650 mg Oral Once [name], MD      . furosemide (LASIX) injection 10 mg  10 mg  Intravenous Once [name], MD      . furosemide (LASIX) injection 10 mg  10 mg Intravenous Once [name], MD      . heparin lock flush 100 unit/mL  500 Units Intracatheter Daily PRN [name], MD      . heparin lock flush 100 unit/mL  250 Units Intracatheter PRN [name], MD      . heparin lock flush 100 unit/mL  500 Units Intracatheter Daily PRN [name], MD      . heparin lock flush 100 unit/mL  250 Units Intracatheter PRN [name], MD      . sodium chloride flush (NS) 0.9 % injection 10 mL  10 mL Intracatheter PRN [name], MD      . sodium chloride flush (NS) 0.9 % injection 10 mL  10 mL Intracatheter PRN [name], MD      . sodium chloride flush (NS) 0.9 % injection 10 mL  10 mL Intracatheter PRN [name], MD      . sodium chloride flush (NS) 0.9 % injection 3 mL  3 mL Intracatheter PRN [name], MD      . sodium chloride flush (NS) 0.9 % injection 3 mL  3 mL Intracatheter PRN [name], MD         Allergies:   Zosyn [piperacillin sod-tazobactam so]; Levaquin [levofloxacin in d5w]; Other; Piperacillin-tazobactam in dex; Zithromax [azithromycin]; and Ciprofloxacin   Social History   Social History  . Marital status: Married    Spouse name: Fritz Pickerel  . Number of children: 2  . Years of education: N/A   Occupational History  . Retired-Pyschology Faculty [employer]   Social History Main Topics  . Smoking status: Never Smoker  . Smokeless tobacco: Never Used  . Alcohol use 3.5 oz/week    7 Standard drinks or equivalent per week     Comment: 1 glass of wine a day  . Drug use: No  . Sexual activity: Not on file   Other Topics Concern  . Not on file   Social History Narrative   Health Care POA:    Emergency Contact: spouse, Yeimy Brabant [phone]   End of Life Plan:    Who  lives with you: spouse, 2 story home   Any pets: Dog, "AKU"   Diet: Patient has a varied diet of protein, vegetables, starches.  Limits red meats.   Exercise: Patient exercises atleast 3 times a week for  over an hour.   Seatbelts: Patient reports wearing her seatbelt when in vehicle.    Nancy Fetter Exposure/Protection: Patient reports wearing sunscreen daily.   Hobbies: Basketball, gardening, swimming, reading, biking           Family History:  The patient's family history includes Cancer in her brother; Glaucoma in her mother; Heart disease in her father and mother.     ROS:   Please see the history of present illness.    ROS All other systems reviewed and are negative.   PHYSICAL EXAM:   VS:  BP 122/68   Pulse 84   Ht _0  (1.676 m)   Wt 124 lb (56.2 kg)   BMI 20.01 kg/m    GEN: Well nourished, well developed, in no acute distress  HEENT: normal  Neck: no JVD, carotid bruits, or masses Cardiac: RRR; no murmurs, rubs, or gallops,no edema  Respiratory:  clear to auscultation bilaterally, normal work of breathing GI: soft, nontender, nondistended, + BS MS: no deformity or atrophy  Skin: warm and dry, no rash Neuro:  Alert and Oriented x 3, Strength and sensation are intact Psych: euthymic mood, full affect  Wt Readings from Last 3 Encounters:  07/24/16 124 lb (56.2 kg)  07/02/16 122 lb (55.3 kg)  06/28/16 123 lb 12.8 oz (56.2 kg)      Studies/Labs Reviewed:   EKG:  EKG is NOT ordered today.   Recent Labs: 03/16/2016: Magnesium 1.7 03/25/2016: B Natriuretic Peptide 188.9 06/28/2016: ALT 16; BUN 19.0; Creatinine 0.7; HGB 10.1; Platelets 114; Potassium 3.9; Sodium 140   Lipid Panel    Component Value Date/Time   CHOL 207 (H) 06/02/2009 2018   TRIG 68 06/02/2009 2018   HDL 73 06/02/2009 2018   CHOLHDL 2.8 Ratio 06/02/2009 2018   VLDL 14 06/02/2009 2018   [place] 120 (H) 06/02/2009 2018    Additional studies/ records that were reviewed today include:  2D ECHO: 03/24/2016 LV EF: 45% -   50% Study Conclusions - Left ventricle: The cavity size was normal. Wall thickness was   normal. Systolic function was mildly reduced. The estimated   ejection fraction was in the  range of 45% to 50%. Features are   consistent with a pseudonormal left ventricular filling pattern,   with concomitant abnormal relaxation and increased filling   pressure (grade 2 diastolic dysfunction). - Mitral valve: There was mild regurgitation. - Left atrium: The atrium was mildly dilated.  2D ECHO: 06/27/2016 LV EF: 25% -   30% Study Conclusions - Left ventricle: The cavity size was mildly dilated. Systolic   function was severely reduced. The estimated ejection fraction   was in the range of 25% to 30%. Diffuse hypokinesis. Doppler   parameters are consistent with abnormal left ventricular   relaxation (grade 1 diastolic dysfunction). Doppler parameters   are consistent with intermediate end-diastolic filling pressure. - Aortic valve: Trileaflet; normal thickness leaflets. There was   trivial regurgitation. - Aortic root: The aortic root was normal in size. - Mitral valve: Structurally normal valve. There was mild   regurgitation. - Right ventricle: The cavity size was normal. Wall thickness was   normal. Systolic function was normal. - Right atrium: The atrium was normal in size. - Tricuspid valve: There  was trivial regurgitation. - Pulmonic valve: There was trivial regurgitation. - Pulmonary arteries: Systolic pressure was within the normal   range. - Inferior vena cava: The vessel was dilated. The respirophasic   diameter changes were blunted (< 50%), consistent with elevated   central venous pressure. - Pericardium, extracardiac: There was no pericardial effusion. Impressions: - LVEF is severely decreased estimated at 25-30% with diffuse   hypokinesis.   There is a highly mobile echodensity attached to the aortic side   of the left coronary leaflet measuring 1 x 0.7 x 0.4 cm. The   differential diagnosis includes inferctious endocarditis,   marantic endocarditis, thrombus, papillary fibroelastoma. Further   evaluation with a TEE is recommended.   ASSESSMENT &  PLAN:   Shannon Rush is a 76 y.o. female with a history of multiple myeloma s/p bone marrow transplant (followed at [facility] by Dr. [name] and in [place] by Dr. Juliann Mule), osteoporosis, HLD and CAD: inferior STEMI s/p thrombolytics (02/2014) who presents to clinic for follow up.   1. Acute on Chronic systolic CHF: EF 01-00% in 03/2016, decreased to 25-30% in December 2017 on velcade and daratuzumab for relapsing multiple myeloma. Previously on Pomalidomide. She is being followed by Dr. Burr Medico. She is currently asymptomatic, euvolemic, we will repeat her echocardiogram in February and if her heart function not improved or worsening we will have to recommend to change her regimen, d.\/c velcade. Continue lisinopril and Toprol at the current dose.  2. Aortic mobile echodensity: attached to the aortic side of the left coronary leaflet measuring 1 x 0.7 x 0.4 cm. At first plan was to perform a TEE but then review of previous echo from [facility] in 2014 showed linear mass on aortic valve that is likely the same mass that has been stable over time: No need for TEE.  She has no signs of infective endocarditis.  3. Multiple myeloma s/p bone marrow transplant: (followed at [facility] by Dr. [name] and in [facility] by Dr. Burr Medico). Restarted Daratumumab and Velcade as above.  4. HLD: continue statin  5. CAD: inferior STEMI s/p thrombolytics (02/2014). Plavix recent discontinued 2/2 thrombocytopenia but it was resumed at discharge. Continue plavix, statin and BB   6. Easy bruising - stop Plavix, start ASA.   Medication Adjustments/Labs and Tests Ordered: Current medicines are reviewed at length with the patient today.  Concerns regarding medicines are outlined above.  Medication changes, Labs and Tests ordered today are listed in the Patient Instructions below. There are no Patient Instructions on file for this visit.   Signed, Ena Dawley, MD  07/24/2016 11:24 AM    [address], [place], [place]  [address] Phone: [phone]; Fax: [phone]

## 2016-07-25 ENCOUNTER — Ambulatory Visit (HOSPITAL_BASED_OUTPATIENT_CLINIC_OR_DEPARTMENT_OTHER): Payer: Medicare Other

## 2016-07-25 ENCOUNTER — Ambulatory Visit (HOSPITAL_BASED_OUTPATIENT_CLINIC_OR_DEPARTMENT_OTHER): Payer: Medicare Other | Admitting: Hematology

## 2016-07-25 ENCOUNTER — Encounter: Payer: Self-pay | Admitting: Hematology

## 2016-07-25 ENCOUNTER — Other Ambulatory Visit (HOSPITAL_BASED_OUTPATIENT_CLINIC_OR_DEPARTMENT_OTHER): Payer: Medicare Other

## 2016-07-25 VITALS — BP 119/66 | HR 78 | Temp 97.9°F | Resp 18 | Ht 66.0 in | Wt 128.5 lb

## 2016-07-25 VITALS — BP 98/54 | HR 76 | Temp 97.8°F | Resp 16

## 2016-07-25 DIAGNOSIS — D6481 Anemia due to antineoplastic chemotherapy: Secondary | ICD-10-CM | POA: Diagnosis not present

## 2016-07-25 DIAGNOSIS — C9000 Multiple myeloma not having achieved remission: Secondary | ICD-10-CM

## 2016-07-25 DIAGNOSIS — Z5112 Encounter for antineoplastic immunotherapy: Secondary | ICD-10-CM | POA: Diagnosis not present

## 2016-07-25 DIAGNOSIS — T451X5A Adverse effect of antineoplastic and immunosuppressive drugs, initial encounter: Secondary | ICD-10-CM

## 2016-07-25 DIAGNOSIS — C9002 Multiple myeloma in relapse: Secondary | ICD-10-CM | POA: Diagnosis not present

## 2016-07-25 DIAGNOSIS — I5022 Chronic systolic (congestive) heart failure: Secondary | ICD-10-CM

## 2016-07-25 DIAGNOSIS — M810 Age-related osteoporosis without current pathological fracture: Secondary | ICD-10-CM | POA: Diagnosis not present

## 2016-07-25 DIAGNOSIS — D61818 Other pancytopenia: Secondary | ICD-10-CM | POA: Diagnosis not present

## 2016-07-25 DIAGNOSIS — I252 Old myocardial infarction: Secondary | ICD-10-CM

## 2016-07-25 DIAGNOSIS — R05 Cough: Secondary | ICD-10-CM | POA: Diagnosis not present

## 2016-07-25 DIAGNOSIS — M818 Other osteoporosis without current pathological fracture: Secondary | ICD-10-CM

## 2016-07-25 LAB — COMPREHENSIVE METABOLIC PANEL
ALBUMIN: 3.9 g/dL (ref 3.5–5.0)
ALK PHOS: 71 U/L (ref 40–150)
ALT: 17 U/L (ref 0–55)
ANION GAP: 9 meq/L (ref 3–11)
AST: 21 U/L (ref 5–34)
BILIRUBIN TOTAL: 0.53 mg/dL (ref 0.20–1.20)
BUN: 22.3 mg/dL (ref 7.0–26.0)
CALCIUM: 9.4 mg/dL (ref 8.4–10.4)
CO2: 24 mEq/L (ref 22–29)
Chloride: 108 mEq/L (ref 98–109)
Creatinine: 0.8 mg/dL (ref 0.6–1.1)
EGFR: 78 mL/min/{1.73_m2} — AB (ref >90)
GLUCOSE: 84 mg/dL (ref 70–140)
Potassium: 4 mEq/L (ref 3.5–5.1)
Sodium: 141 mEq/L (ref 136–145)
TOTAL PROTEIN: 6 g/dL — AB (ref 6.4–8.3)

## 2016-07-25 LAB — CBC WITH DIFFERENTIAL/PLATELET
BASO%: 0.6 % (ref 0.0–2.0)
BASOS ABS: 0 10*3/uL (ref 0.0–0.1)
EOS%: 1.8 % (ref 0.0–7.0)
Eosinophils Absolute: 0.1 10*3/uL (ref 0.0–0.5)
HEMATOCRIT: 33.4 % — AB (ref 34.8–46.6)
HEMOGLOBIN: 11 g/dL — AB (ref 11.6–15.9)
LYMPH%: 13.4 % — ABNORMAL LOW (ref 14.0–49.7)
MCH: 36.4 pg — ABNORMAL HIGH (ref 25.1–34.0)
MCHC: 32.9 g/dL (ref 31.5–36.0)
MCV: 110.6 fL — AB (ref 79.5–101.0)
MONO#: 0.3 10*3/uL (ref 0.1–0.9)
MONO%: 9.1 % (ref 0.0–14.0)
NEUT%: 75.1 % (ref 38.4–76.8)
NEUTROS ABS: 2.5 10*3/uL (ref 1.5–6.5)
PLATELETS: 128 10*3/uL — AB (ref 145–400)
RBC: 3.02 10*6/uL — ABNORMAL LOW (ref 3.70–5.45)
RDW: 14.9 % — AB (ref 11.2–14.5)
WBC: 3.3 10*3/uL — AB (ref 3.9–10.3)
lymph#: 0.4 10*3/uL — ABNORMAL LOW (ref 0.9–3.3)

## 2016-07-25 MED ORDER — ACETAMINOPHEN 325 MG PO TABS
ORAL_TABLET | ORAL | Status: AC
  Filled 2016-07-25: qty 2

## 2016-07-25 MED ORDER — PROCHLORPERAZINE MALEATE 10 MG PO TABS
10.0000 mg | ORAL_TABLET | Freq: Once | ORAL | Status: AC
  Administered 2016-07-25: 10 mg via ORAL

## 2016-07-25 MED ORDER — ZOLEDRONIC ACID 4 MG/5ML IV CONC
3.5000 mg | Freq: Once | INTRAVENOUS | Status: AC
  Administered 2016-07-25: 3.5 mg via INTRAVENOUS
  Filled 2016-07-25: qty 4.38

## 2016-07-25 MED ORDER — PROCHLORPERAZINE MALEATE 10 MG PO TABS
ORAL_TABLET | ORAL | Status: AC
  Filled 2016-07-25: qty 1

## 2016-07-25 MED ORDER — DIPHENHYDRAMINE HCL 25 MG PO CAPS
ORAL_CAPSULE | ORAL | Status: AC
  Filled 2016-07-25: qty 2

## 2016-07-25 MED ORDER — DIPHENHYDRAMINE HCL 25 MG PO CAPS
50.0000 mg | ORAL_CAPSULE | Freq: Once | ORAL | Status: AC
  Administered 2016-07-25: 50 mg via ORAL

## 2016-07-25 MED ORDER — METHYLPREDNISOLONE SODIUM SUCC 125 MG IJ SOLR
INTRAMUSCULAR | Status: AC
  Filled 2016-07-25: qty 2

## 2016-07-25 MED ORDER — SODIUM CHLORIDE 0.9 % IV SOLN
Freq: Once | INTRAVENOUS | Status: AC
  Administered 2016-07-25: 10:00:00 via INTRAVENOUS

## 2016-07-25 MED ORDER — SODIUM CHLORIDE 0.9 % IV SOLN
16.0000 mg/kg | Freq: Once | INTRAVENOUS | Status: AC
  Administered 2016-07-25: 900 mg via INTRAVENOUS
  Filled 2016-07-25: qty 40

## 2016-07-25 MED ORDER — MONTELUKAST SODIUM 10 MG PO TABS
ORAL_TABLET | ORAL | Status: AC
  Filled 2016-07-25: qty 1

## 2016-07-25 MED ORDER — MONTELUKAST SODIUM 10 MG PO TABS
10.0000 mg | ORAL_TABLET | Freq: Once | ORAL | Status: AC
  Administered 2016-07-25: 10 mg via ORAL

## 2016-07-25 MED ORDER — ACETAMINOPHEN 325 MG PO TABS
650.0000 mg | ORAL_TABLET | Freq: Once | ORAL | Status: AC
  Administered 2016-07-25: 650 mg via ORAL

## 2016-07-25 MED ORDER — METHYLPREDNISOLONE SODIUM SUCC 125 MG IJ SOLR
125.0000 mg | Freq: Once | INTRAMUSCULAR | Status: AC
  Administered 2016-07-25: 125 mg via INTRAVENOUS

## 2016-07-25 NOTE — Patient Instructions (Signed)
[facility] Discharge Instructions for Patients Receiving Chemotherapy  Today you received the following chemotherapy agents:  Daratumumab (Darzalex)  To help prevent nausea and vomiting after your treatment, we encourage you to take your nausea medication as prescribed.   If you develop nausea and vomiting that is not controlled by your nausea medication, call the clinic.   BELOW ARE SYMPTOMS THAT SHOULD BE REPORTED IMMEDIATELY:  *FEVER GREATER THAN 100.5 F  *CHILLS WITH OR WITHOUT FEVER  NAUSEA AND VOMITING THAT IS NOT CONTROLLED WITH YOUR NAUSEA MEDICATION  *UNUSUAL SHORTNESS OF BREATH  *UNUSUAL BRUISING OR BLEEDING  TENDERNESS IN MOUTH AND THROAT WITH OR WITHOUT PRESENCE OF ULCERS  *URINARY PROBLEMS  *BOWEL PROBLEMS  UNUSUAL RASH Items with * indicate a potential emergency and should be followed up as soon as possible.  Feel free to call the clinic you have any questions or concerns. The clinic phone number is [phone].  Please show the CHEMO ALERT CARD at check-in to the Emergency Department and triage nurse.   

## 2016-07-26 LAB — KAPPA/LAMBDA LIGHT CHAINS
IG LAMBDA FREE LIGHT CHAIN: 2.7 mg/L — AB (ref 5.7–26.3)
Ig Kappa Free Light Chain: 4.6 mg/L (ref 3.3–19.4)
Kappa/Lambda FluidC Ratio: 1.7 — ABNORMAL HIGH (ref 0.26–1.65)

## 2016-07-28 ENCOUNTER — Telehealth: Payer: Self-pay | Admitting: Hematology

## 2016-07-28 NOTE — Telephone Encounter (Signed)
S/w pt, gave appt for 1/25 @ 9.45 am.

## 2016-07-29 LAB — MULTIPLE MYELOMA PANEL, SERUM
ALBUMIN SERPL ELPH-MCNC: 3.6 g/dL (ref 2.9–4.4)
ALPHA 1: 0.3 g/dL (ref 0.0–0.4)
Albumin/Glob SerPl: 1.9 — ABNORMAL HIGH (ref 0.7–1.7)
Alpha2 Glob SerPl Elph-Mcnc: 0.6 g/dL (ref 0.4–1.0)
B-GLOBULIN SERPL ELPH-MCNC: 0.8 g/dL (ref 0.7–1.3)
Gamma Glob SerPl Elph-Mcnc: 0.4 g/dL (ref 0.4–1.8)
Globulin, Total: 2 g/dL — ABNORMAL LOW (ref 2.2–3.9)
IGA/IMMUNOGLOBULIN A, SERUM: 6 mg/dL — AB (ref 64–422)
IgG, Qn, Serum: 455 mg/dL — ABNORMAL LOW (ref 700–1600)
IgM, Qn, Serum: 17 mg/dL — ABNORMAL LOW (ref 26–217)
M PROTEIN SERPL ELPH-MCNC: 0.1 g/dL — AB
TOTAL PROTEIN: 5.6 g/dL — AB (ref 6.0–8.5)

## 2016-08-06 NOTE — Progress Notes (Signed)
[name], MD [name] [id number]  DIAGNOSIS: Multiple myeloma with relapse  CHIEF COMPLAINS: Follow-up of multiple myeloma  CURRENT TREATMENT:   1. Pomalidomide 4 mg daily on day 1-21, dexamethasone 40 mg on day 1, 8 and 15, every 28 days, started on 02/27/2016, daratumumab weekly started on 03/07/2016, treatment on hold since 03/09/2016 due to hospitalization and severe cytopenia 2. Daratumumab restarted on 04/26/2016, Velcade 1.28m/m2 and dexa 219mweekly start on 11/3, velcade held since 06/28/2016 due to CHF  2. Zometa 3.5 mg monthly started on 06/2013, on every 3 months now, last 07/25/2016  Oncology History   [facility]  Staging form: Multiple Myeloma, AJCC 6th Edition   - Clinical: Stage IIIA - Signed by [name] on 09/04/2013      [name]  05/26/2012 Initial Diagnosis    Presenting IgG was 4,040 mg/dL on 06/05/2012 (IgA 35; IgM 34); kappa free light chain 34.4 mg/dL, lambda 0.00, kappa:lambda ratio of 34.75; SPEP with M-spike of 2.60.      06/30/2012 Imaging    Numerous lytic lesions throughout the calvarium.  Lytic lesion within the left lateral scapula.  Findings most compatible with metastases or myeloma. Multiple mid and lower thoracic compression fractures.  Slight compression through the endplates at L3.      1203/50/0938one Marrow Biopsy    Bone marrow biopsy showed 49% plasma cell. Normal classical cytogenetics; however, myeloma FISH panel showed 13q- (intermediate risk)          07/14/2012 - 07/27/2012 Radiation Therapy    Palliative radiation 20 Gy over 10 fractions between to thoracic spine cord compression and symptomatic left scapula lytic lesion.      08/10/2012 -  Chemotherapy    Started SQ Velcade once weekly, 3 weeks on, 1 weeks off; daily Revlimid d1-21, 7 days off; and Dexamethasone 4055mO weekly.       02/12/2013 Bone  Marrow Transplant    [facility]     06/14/2013 Tumor Marker    IgG  1830,  Kappa:lamba ratio, 1.69 (baseline was 34.75 or   M-spike 0.28 (baseline of 2.6 or  89.3% of baseline).        06/25/2013 -  Chemotherapy    Started zometa 3.5 mg monthly       09/01/2013 -  Chemotherapy    Maintenance therapy with revlimid 5mg33mily for 14 days and then off for 7 (decreased from 21 days on and 7 days off based on neutropenia).       09/13/2013 - 09/17/2014 Hospital Admission    Hospital admission for pneumonia      12/20/2013 Treatment Plan Change    Maintenance therapy decreased to 2.5 mg daily for 21 days and then off for 7 days based on low counts.       01/25/2014 Tumor Marker    IgG 1360 M spike 0.5      03/01/2014 - 03/05/2014 Hospital Ad[facility], received thrombolytic therapy, cath showed 60% prox RCA, mid-distal RCA 50%, Echo normal, no stents.      04/11/2014 - 08/07/2014 Chemotherapy    Continue Revlimid at 2.5 mg 3 weeks on 1 week off      08/08/2014 - 08/13/2014 Hospital Admission    Hospital admission for pneumonia. Her Revlimid was held.  08/29/2014 - 09/14/2014 Chemotherapy    Maintenance Revlimid restarted      09/14/2014 - 09/17/2014 Hospital Admission    Admitted for pneumonia after coming back from a trip in [name]. Revlimid was held again.      11/13/2014 - 12/25/2015 Chemotherapy    Maintenance Revlimid restarted, changed to 2.'5mg'$  daily, 2 weks on, 1 week off from 12/15/2014, stopped due to disease progression       01/24/2016 Progression    Patient is M protein has gradually increased to 1.0g, repeated a bone marrow biopsy showed plasma cell 10-30%      02/27/2016 -  Chemotherapy    Pomalidomide 4 mg daily on day 1-21, dexamethasone 40 mg on day 1, 8 and 15, every 28 days, started on 02/27/2016, daratumumab weekly started on 03/07/2016, held after first dose, due to  hospitalization and a severe pancytopenia      03/10/2016 - 03/29/2016 Hospital Admission    Pt was admitted for sepsis from pneumonia, and severe pancytopenia. She required ICU stay for a few days due to hypotension, developed b/l pleural effusion required diuretics and b/l thoracentesis. She also required blood and plt transfusion, and prolonged neupogen injection for severe neutropenia. She was discharged home after 3 weeks hospital stay       04/26/2016 -  Chemotherapy    Daratumumab restarted on 04/26/2016, Velcade 1.'3mg'$ /m2 and dexa '20mg'$  weekly start on 11/3      INTERVAL HISTORY:  Shannon Rush 76 y.o. female with a past medical history of MM as detailed above, s/p SCT is here for follow-up. She has been seeing her cardiologist Dr. Meda Coffee lately due to her worsening CHF, her recent repeated ECHO showed EF 25-30%, She denies any significant dyspnea, but is still has mild cough with clear mucus production. No fever or chills. No orthopnea. Dr. Meda Coffee has started her on low-dose lisinopril, and plan to repeat echo next month. She otherwise is doing well, has good appetite and energy level. No fever or chills.  INTERIM HISTORY:  She returns for follow up and cycle 9 of chemo. She feels about the same as 2 weeks ago. Her dry cough is a little better. Denies fever, or SOB. She is still riding her bike. She is having some problems with depression due to her son-in-law who is terminal from cancer. She has some swelling in her legs, but wears compression socks. Denies any other concerns.   MEDICAL HISTORY: Past Medical History:  Diagnosis Date  . CAD (coronary artery disease)    a. inf STEMI (in [place] >>> [facility]) >>> LHC (8/15- [place]):  pRCA 60%, mid to dist RCA 50% >>> med rx  . Cord compression ([place]) 07/13/12   MRI- diffuse myeloma involvement of T-L spine  . History of radiation therapy 07/13/12-07/27/12   spinal cord compression T3-T10,left scapula  . Hx of  echocardiogram    Echo (03/02/14-[facility] at [facility]):  Normal LVEF without significant regional wall motion abnormalities. Mild   . Multiple myeloma ([place]) 07/01/2012  . OSTEOPOROSIS 06/11/2010   Multiple compression fractures; and spontaneous fracture of sternum Qualifier: Diagnosis of  By: [name], [name]     . Thoracic kyphosis 07/13/12   per MRI scan  . Unspecified deficiency anemia      ALLERGIES:  is allergic to zosyn [piperacillin sod-tazobactam so]; levaquin [levofloxacin in d5w]; other; piperacillin-tazobactam in dex; zithromax [azithromycin]; and ciprofloxacin.  MEDICATIONS: has a current medication list which includes the  following prescription(s): acetaminophen, acyclovir, aspirin ec, atorvastatin, calcium carbonate, dexamethasone, fluticasone, lisinopril, metoprolol succinate, nitroglycerin, ondansetron, proair hfa, and prochlorperazine, and the following Facility-Administered Medications: sodium chloride, sodium chloride, acetaminophen, acetaminophen, furosemide, furosemide, heparin lock flush, heparin lock flush, heparin lock flush, heparin lock flush, sodium chloride flush, sodium chloride flush, sodium chloride flush, sodium chloride flush, and sodium chloride flush.  SURGICAL HISTORY:  Past Surgical History:  Procedure Laterality Date  . APPENDECTOMY    . CESAREAN SECTION     x2   . COLONOSCOPY  2007   neg with Dr. [name]  . ELBOW SURGERY    . HIP SURGERY  2009   left  . TUBAL LIGATION      REVIEW OF SYSTEMS:   Constitutional: Denies abnormal weight loss Eyes: Denies blurriness of vision Ears, nose, mouth, throat, and face: Denies mucositis or sore throat,  Respiratory: Denies dyspnea or wheezes  Cardiovascular: Denies palpitation, chest discomfort (+) leg swelling Gastrointestinal:  Denies nausea, heartburn or change in bowel habits Skin: Denies abnormal skin rashes Lymphatics: Denies new lymphadenopathy or easy  bruising Neurological:Denies numbness, tingling or new weaknesses Behavioral/Psych: Mood is stable, no new changes (+) depression All other systems were reviewed with the patient and are negative.   PHYSICAL EXAMINATION: ECOG PERFORMANCE STATUS: 1  Blood pressure 118/70, pulse 84, temperature 97.8 F (36.6 C), temperature source Oral, resp. rate 18, height '5\' 6"'$  (1.676 m), weight 125 lb (56.7 kg), SpO2 98 %.   GENERAL:alert, no distress and comfortable; easily mobile to exam table; kyphosis, moderate.  SKIN: skin color, texture, turgor are normal, no rashes or significant lesions except a small healing, dry wound in the right lower leg above his ankle, with surrounding skin erythema and swelling EYES: normal, Conjunctiva are pink, 1 tear/injectionof blood vessel left eye in sclera. OROPHARYNX:no exudate, no erythema and lips, buccal mucosa, and tongue normal, no ONJ NECK: supple, thyroid normal size, non-tender, without nodularity; nodules cervical bilaterally.  LYMPH:  no palpable lymphadenopathy in the cervical, axillary or supraclavicular LUNGS: crackles at the bases bilaterally with normal breathing effort, no wheezes or rhonchi HEART: regular rate & rhythm and no murmurs  ABDOMEN:abdomen soft, non-tender and normal bowel sounds Musculoskeletal:no cyanosis of digits and no clubbing, (+) spine scoliosis  NEURO: alert & oriented x 3 with fluent speech, no focal motor/sensory deficits   Labs:  CBC Latest Ref Rng & Units 08/08/2016 07/25/2016 06/28/2016  WBC 3.9 - 10.3 10e3/uL 3.5(L) 3.3(L) 3.5(L)  Hemoglobin 11.6 - 15.9 g/dL 11.6 11.0(L) 10.1(L)  Hematocrit 34.8 - 46.6 % 34.5(L) 33.4(L) 30.8(L)  Platelets 145 - 400 10e3/uL 143(L) 128(L) 114(L)    CMP Latest Ref Rng & Units 07/25/2016 07/25/2016 06/28/2016  Glucose 70 - 140 mg/dl 84 - 105  BUN 7.0 - 26.0 mg/dL 22.3 - 19.0  Creatinine 0.6 - 1.1 mg/dL 0.8 - 0.7  Sodium 136 - 145 mEq/L 141 - 140  Potassium 3.5 - 5.1 mEq/L 4.0 - 3.9   Chloride 101 - 111 mmol/L - - -  CO2 22 - 29 mEq/L 24 - 23  Calcium 8.4 - 10.4 mg/dL 9.4 - 8.9  Total Protein 6.4 - 8.3 g/dL 6.0(L) 5.6(L) 5.9(L)  Total Bilirubin 0.20 - 1.20 mg/dL 0.53 - 0.57  Alkaline Phos 40 - 150 U/L 71 - 102  AST 5 - 34 U/L 21 - 17  ALT 0 - 55 U/L 17 - 16   ANC 2.6K  SPEP M-PROTEIN 10/01/2013: 0.18 (nadir)  10/13/2014: 0.6 11/21/2014: 0.5 01/30/2015: 0.6 03/13/2015:  0.6 04/20/2015: 0.5 06/01/2015: 0.5 07/20/2015: 0.7 09/14/2015: 0.7 10/26/2015: 0.8 12/07/2015: 1.0 02/07/2016: 1.1  04/03/2016: 0.2 05/03/2016: 0.2  05/31/2016: 0.1 06/28/2016: 0.1 07/25/2016: 0.1  IgG ([phone]) 10/13/2014: 1710 11/21/2014: 1460 01/30/2015: 1380 03/13/15: 1570 04/20/2015: 1550 06/01/2015: 1590 07/22/2015: 1770 09/14/2015: 1787 10/26/2015: 1898 12/07/2015: 2045 02/07/2016: 1883 04/03/2016: 787 05/03/2016: 685  05/31/2016: 544 06/28/2016: 463 07/25/2016: 455  Serum kappa, lambda light chains, and ratio  11/21/2014: 3.95, 2.08, 1.90  01/30/2015: 3.92, 2.64, 1.48 03/13/2015: 3.55, 2.17, 1.64 04/20/15: 4.52, 1.78, 2.54 06/01/2015: 4.82, 1.75, 2.75 07/20/2015: 8.71, 2.43, 3.58 09/14/2015: 9.17, 2.02, 4.55 10/26/2015: 11.4, 1.75, 6.42 12/07/2015: 1.54, 1.83, 8.44 02/07/2016: 1.93, 1.42, 13.6 04/03/2016: 0.96, 0.95, 1.01  05/03/2016: 0.71, 0.34, 2.09  05/31/2016: 0.48, 0.26, 1.85 06/28/2016: 0.52, 0.60, 0.87  24 hr urine PEP (total protein, M-spike) 10/13/2014: (-) 02/12/2016: '265mg'$ , '68mg'$  04/05/2016: 114 mg, (-)  PATHOLOGY REPORT  Diagnosis 01/24/2016 Bone Marrow, Aspirate,Biopsy, and Clot, RT iliac and core - VARIABLY CELLULAR BONE MARROW WITH PLASMA CELL NEOPLASM. - SEE COMMENT. PERIPHERAL BLOOD: - MACROCYTIC ANEMIA. Diagnosis Note The bone marrow is variably cellular with trilineage hematopoiesis and non specific myeloid changes. In this background, the plasma cell component is variable ranging from less than 10 % in many areas to 30% in some aspirate particles with relatively low  cellularity. Foci of interstitial infiltrates and small clusters are also seen in the clot section. Immunohistochemical stains show kappa light chain restriction in atypical plasma cell clusters and infiltrates consistent with plasma cell neoplasm. Correlation with cytogenetic and FISH studies is recommended. (BNS:gt, 02/09/16)  Diagnosis 03/28/16 PLEURAL FLUID, LEFT (SPECIMEN 1 OF 1 COLLECTED 03/28/16): REACTIVE MESOTHELIAL CELLS  RADIOGRAPHIC STUDIES:  DG Chest 2 View 06/28/16 IMPRESSION: Chronic changes without acute abnormality.  ASSESSMENT: Shannon Rush 76 y.o. female with a history of Multiple Myeloma s/p autotransplant in August 2014 followed by maintenence low dose Revlimid therapy, and relapsed in 01/2016  PLAN:   1. IgG kappa Multiple myeloma s/p Auto SCT, intermediate risk, relapsed in 01/2016 --MM markers demonstrate a very good initial response (M protein baseline 2.6, down to 0.18 after induction chemo and transplant, stable around 0.5 since July 2015, but has gradually increased lately),  she unfortunately relapsed in July 2017 -I previously discussed her repeated bone marrow biopsy from 02/01/2016, which showed 10-30% plasma cells in her marrow -I agree with Dr. Kendell Bane recommendation to stop revlimid and change to Daratumumab, Pomalidomide and dexamethasone due to her disease relapse. I don't think she has resistance to Revlimid, she has been on very small dose of Revlimid due to her prior history of multiple infections. -She started first cycle DaraPD in 02/2016, but in 2 weeks she developed severe pancytopenia, pneumonia with sepsis required 3 weeks hospital stay. She did have a very nice response to the treatment, her M-protein has significantly reduced from 1.2 to 0.2, light chain levels normalized even with half cycle treatment  -I recommended her to change regiment to DaraVD, and started her on dara every 2 weeks for 2 doses, she tolerated this very well -Her previous  echo in December 2017 showed a significant drop on EF, she is not much symptomatically for CHF, due to the potential contribution of Velcade to her CHF, I'll hold her Velcade for now. She is scheduled to repeat echo next month -Significant cardiotoxicity including CHF has not been reported in [place], I'll continue every 2 weeks, along with dexamethasone 20 mg every other week -lab reviewed, mild leukocytopenia, and anemia, all stable, will proceed  dexa and dara today, hold velcade -She forgot to take her dexamethasone yesterday. She will take it tomorrow morning. I recommend her to take dexa the day before Dara infusion, which also helps to prevent infusion reaction. -She is doing well intravenously. She does not want a port for now.   2. Pancytopenia  -secondary to chemo  -Mild leukopenia, no neutropenia, mild anemia stable -Continue monitoring closely  3. CHF, CAD, inferior STEMI in 02/2014 -continue metoprolol and lipitor, Dr. Meda Coffee started her on lisinopril also -follow up with Dr. Meda Coffee  -her previous echo showed EF 25-30%, significantly lower than before. -Another Echo ordered for Feb 2018  4. Osteoporosis -previous bone density scan showed worsening osteoporosis, continue zometa, changed to every 3 months after 03/2015.  -she declined repeating DEXA  -continue Zometa every 3 months, next due in early April    5. Mild productive cough -Possibly related to her CHF, she had allergy reaction to Levaquin, completed a course of doxycycline. No significant improvement. -Still present, but improving.   Plan -will proceed with aratumumab today and continue every 2 weeks, hold Velcade, she will take dexa tomorrow  -Echo Feb 12.  -Follow up in 2 weeks with Dara treatment   All questions were answered. The patient knows to call the clinic with any problems, questions or concerns. We can certainly see the patient much sooner if necessary.  I spent 20 minutes counseling the patient face to  face. The total time spent in the appointment was 25 minutes.  This document serves as a record of services personally performed by [name], MD. It was created on her behalf by Martinique Casey, a trained medical scribe. The creation of this record is based on the scribe's personal observations and the provider's statements to them. This document has been checked and approved by the attending provider.  I have reviewed the above documentation for accuracy and completeness, and I agree with the above information.       [name]  08/08/2016

## 2016-08-08 ENCOUNTER — Encounter: Payer: Self-pay | Admitting: Hematology

## 2016-08-08 ENCOUNTER — Other Ambulatory Visit (HOSPITAL_BASED_OUTPATIENT_CLINIC_OR_DEPARTMENT_OTHER): Payer: Medicare Other

## 2016-08-08 ENCOUNTER — Ambulatory Visit (HOSPITAL_BASED_OUTPATIENT_CLINIC_OR_DEPARTMENT_OTHER): Payer: Medicare Other | Admitting: Hematology

## 2016-08-08 ENCOUNTER — Ambulatory Visit (HOSPITAL_BASED_OUTPATIENT_CLINIC_OR_DEPARTMENT_OTHER): Payer: Medicare Other

## 2016-08-08 VITALS — BP 118/70 | HR 84 | Temp 97.8°F | Resp 18 | Ht 66.0 in | Wt 125.0 lb

## 2016-08-08 VITALS — BP 101/74 | HR 74 | Temp 98.0°F | Resp 16

## 2016-08-08 DIAGNOSIS — I5022 Chronic systolic (congestive) heart failure: Secondary | ICD-10-CM | POA: Diagnosis not present

## 2016-08-08 DIAGNOSIS — T451X5A Adverse effect of antineoplastic and immunosuppressive drugs, initial encounter: Secondary | ICD-10-CM

## 2016-08-08 DIAGNOSIS — D6481 Anemia due to antineoplastic chemotherapy: Secondary | ICD-10-CM | POA: Diagnosis not present

## 2016-08-08 DIAGNOSIS — C9000 Multiple myeloma not having achieved remission: Secondary | ICD-10-CM

## 2016-08-08 DIAGNOSIS — I252 Old myocardial infarction: Secondary | ICD-10-CM

## 2016-08-08 DIAGNOSIS — M818 Other osteoporosis without current pathological fracture: Secondary | ICD-10-CM

## 2016-08-08 DIAGNOSIS — Z5112 Encounter for antineoplastic immunotherapy: Secondary | ICD-10-CM | POA: Diagnosis not present

## 2016-08-08 DIAGNOSIS — C9002 Multiple myeloma in relapse: Secondary | ICD-10-CM

## 2016-08-08 LAB — CBC WITH DIFFERENTIAL/PLATELET
BASO%: 0.9 % (ref 0.0–2.0)
BASOS ABS: 0 10*3/uL (ref 0.0–0.1)
EOS ABS: 0 10*3/uL (ref 0.0–0.5)
EOS%: 0.6 % (ref 0.0–7.0)
HEMATOCRIT: 34.5 % — AB (ref 34.8–46.6)
HEMOGLOBIN: 11.6 g/dL (ref 11.6–15.9)
LYMPH%: 11 % — ABNORMAL LOW (ref 14.0–49.7)
MCH: 36.5 pg — AB (ref 25.1–34.0)
MCHC: 33.6 g/dL (ref 31.5–36.0)
MCV: 108.5 fL — AB (ref 79.5–101.0)
MONO#: 0.4 10*3/uL (ref 0.1–0.9)
MONO%: 11 % (ref 0.0–14.0)
NEUT%: 76.5 % (ref 38.4–76.8)
NEUTROS ABS: 2.7 10*3/uL (ref 1.5–6.5)
PLATELETS: 143 10*3/uL — AB (ref 145–400)
RBC: 3.18 10*6/uL — ABNORMAL LOW (ref 3.70–5.45)
RDW: 14.3 % (ref 11.2–14.5)
WBC: 3.5 10*3/uL — ABNORMAL LOW (ref 3.9–10.3)
lymph#: 0.4 10*3/uL — ABNORMAL LOW (ref 0.9–3.3)

## 2016-08-08 LAB — COMPREHENSIVE METABOLIC PANEL
ALT: 18 U/L (ref 0–55)
ANION GAP: 9 meq/L (ref 3–11)
AST: 20 U/L (ref 5–34)
Albumin: 4.1 g/dL (ref 3.5–5.0)
Alkaline Phosphatase: 69 U/L (ref 40–150)
BUN: 21.1 mg/dL (ref 7.0–26.0)
CALCIUM: 9.2 mg/dL (ref 8.4–10.4)
CHLORIDE: 106 meq/L (ref 98–109)
CO2: 25 meq/L (ref 22–29)
CREATININE: 0.8 mg/dL (ref 0.6–1.1)
EGFR: 78 mL/min/{1.73_m2} — ABNORMAL LOW (ref >90)
Glucose: 91 mg/dl (ref 70–140)
POTASSIUM: 4 meq/L (ref 3.5–5.1)
Sodium: 140 mEq/L (ref 136–145)
Total Bilirubin: 0.7 mg/dL (ref 0.20–1.20)
Total Protein: 6.2 g/dL — ABNORMAL LOW (ref 6.4–8.3)

## 2016-08-08 MED ORDER — ACETAMINOPHEN 325 MG PO TABS
650.0000 mg | ORAL_TABLET | Freq: Once | ORAL | Status: AC
  Administered 2016-08-08: 650 mg via ORAL

## 2016-08-08 MED ORDER — MONTELUKAST SODIUM 10 MG PO TABS
ORAL_TABLET | ORAL | Status: AC
  Filled 2016-08-08: qty 1

## 2016-08-08 MED ORDER — DIPHENHYDRAMINE HCL 25 MG PO CAPS
50.0000 mg | ORAL_CAPSULE | Freq: Once | ORAL | Status: AC
  Administered 2016-08-08: 50 mg via ORAL

## 2016-08-08 MED ORDER — MONTELUKAST SODIUM 10 MG PO TABS
10.0000 mg | ORAL_TABLET | Freq: Once | ORAL | Status: AC
  Administered 2016-08-08: 10 mg via ORAL

## 2016-08-08 MED ORDER — PROCHLORPERAZINE MALEATE 10 MG PO TABS
10.0000 mg | ORAL_TABLET | Freq: Once | ORAL | Status: AC
  Administered 2016-08-08: 10 mg via ORAL

## 2016-08-08 MED ORDER — DIPHENHYDRAMINE HCL 25 MG PO CAPS
ORAL_CAPSULE | ORAL | Status: AC
  Filled 2016-08-08: qty 2

## 2016-08-08 MED ORDER — SODIUM CHLORIDE 0.9 % IV SOLN
Freq: Once | INTRAVENOUS | Status: AC
  Administered 2016-08-08: 11:00:00 via INTRAVENOUS

## 2016-08-08 MED ORDER — SODIUM CHLORIDE 0.9 % IV SOLN
16.0000 mg/kg | Freq: Once | INTRAVENOUS | Status: AC
  Administered 2016-08-08: 900 mg via INTRAVENOUS
  Filled 2016-08-08: qty 40

## 2016-08-08 MED ORDER — METHYLPREDNISOLONE SODIUM SUCC 125 MG IJ SOLR
125.0000 mg | Freq: Once | INTRAMUSCULAR | Status: AC
  Administered 2016-08-08: 125 mg via INTRAVENOUS

## 2016-08-08 MED ORDER — PROCHLORPERAZINE MALEATE 10 MG PO TABS
ORAL_TABLET | ORAL | Status: AC
  Filled 2016-08-08: qty 1

## 2016-08-08 MED ORDER — METHYLPREDNISOLONE SODIUM SUCC 125 MG IJ SOLR
INTRAMUSCULAR | Status: AC
  Filled 2016-08-08: qty 2

## 2016-08-08 MED ORDER — ACETAMINOPHEN 325 MG PO TABS
ORAL_TABLET | ORAL | Status: AC
  Filled 2016-08-08: qty 2

## 2016-08-08 NOTE — Patient Instructions (Addendum)
[facility] Discharge Instructions for Patients Receiving Chemotherapy  Today you received the following chemotherapy agents Darzalex.  To help prevent nausea and vomiting after your treatment, we encourage you to take your nausea medication as directed.  If you develop nausea and vomiting that is not controlled by your nausea medication, call the clinic.   BELOW ARE SYMPTOMS THAT SHOULD BE REPORTED IMMEDIATELY:  *FEVER GREATER THAN 100.5 F  *CHILLS WITH OR WITHOUT FEVER  NAUSEA AND VOMITING THAT IS NOT CONTROLLED WITH YOUR NAUSEA MEDICATION  *UNUSUAL SHORTNESS OF BREATH  *UNUSUAL BRUISING OR BLEEDING  TENDERNESS IN MOUTH AND THROAT WITH OR WITHOUT PRESENCE OF ULCERS  *URINARY PROBLEMS  *BOWEL PROBLEMS  UNUSUAL RASH Items with * indicate a potential emergency and should be followed up as soon as possible.  Feel free to call the clinic you have any questions or concerns. The clinic phone number is [phone].  Please show the CHEMO ALERT CARD at check-in to the Emergency Department and triage nurse.    

## 2016-08-09 LAB — UPEP/UIFE/LIGHT CHAINS/TP, 24-HR UR
% BETA, URINE: 32.4 %
ALBUMIN, U: 27.8 %
ALPHA 1 URINE: 2.3 %
ALPHA-2-GLOBULIN, U: 7.9 %
FREE KAPPA LT CHAINS, UR: 7.15 mg/L (ref 1.35–24.19)
Free Lambda Lt Chains,Ur: 0.29 mg/L (ref 0.24–6.66)
GAMMA GLOBULIN URINE: 29.6 %
Kappa/Lambda Ratio,U: 24.66 — ABNORMAL HIGH (ref 2.04–10.37)
PROTEIN UR: 6 mg/dL
Prot,24hr calculated: 99 mg/24 hr (ref 30–150)

## 2016-08-10 ENCOUNTER — Encounter: Payer: Self-pay | Admitting: Hematology

## 2016-08-14 DIAGNOSIS — M6281 Muscle weakness (generalized): Secondary | ICD-10-CM | POA: Diagnosis not present

## 2016-08-14 DIAGNOSIS — C9000 Multiple myeloma not having achieved remission: Secondary | ICD-10-CM | POA: Diagnosis not present

## 2016-08-15 ENCOUNTER — Telehealth: Payer: Self-pay

## 2016-08-15 ENCOUNTER — Other Ambulatory Visit: Payer: Self-pay | Admitting: Hematology

## 2016-08-15 MED ORDER — DOXYCYCLINE HYCLATE 100 MG PO TABS: 100.0000 mg | ORAL_TABLET | Freq: Two times a day (BID) | ORAL | 0 refills | Status: DC

## 2016-08-15 NOTE — Telephone Encounter (Signed)
Pt called stating she has a sore throat and low grade fever (not 100.5). She has had sore throat for about 3-4 days. No nasal sinus involvement. No body aches. she doesn't feel terrible but not great either. She has infrequent cough of yellow tinged sputum. She is asking how to prevent it from getting worse. This RN instructed salt and lemon gargles, Vitamin C and zinc, and echinacea. Watch for worsening temp, cough. She is on Daratumumab 08/08/16  and velcade 06/28/16  Next daratumumab 2/8 Velcade on hold Echo on 2/12  NOTE no appt scheduled for 2/8 yet (LOS sent OV 1/25)

## 2016-08-15 NOTE — Telephone Encounter (Signed)
I called pt back, she is afebrile now, feels better. Due to her multiple severe pneumonia in the past, I will call in doxycycline 100mg  bid for 7 days, she will start taking if she spikes fever again with T>100, productive cough or not feeling well. She knows to call us tomorrow if she still not feeling well.   [name] MD

## 2016-08-16 ENCOUNTER — Telehealth: Payer: Self-pay | Admitting: *Deleted

## 2016-08-16 NOTE — Telephone Encounter (Signed)
Pt called to give update on her progress.   Stated she is feeling much better today;  Able to do her routine at the gym;  Drinking fluids fine.  Temp this am was  99, but did not feel she has any fever now.

## 2016-08-19 ENCOUNTER — Telehealth: Payer: Self-pay | Admitting: Hematology

## 2016-08-19 NOTE — Progress Notes (Signed)
[name], [place] Shannon Rush [id number]  DIAGNOSIS: Multiple myeloma with relapse  CHIEF COMPLAINS: Follow-up of multiple myeloma  CURRENT TREATMENT:   1. Pomalidomide 4 mg daily on day 1-21, dexamethasone 40 mg on day 1, 8 and 15, every 28 days, started on 02/27/2016, daratumumab weekly started on 03/07/2016, treatment on hold since 03/09/2016 due to hospitalization and severe cytopenia 2. Daratumumab restarted on 04/26/2016, Velcade 1.28m/m2 and dexa 219mweekly start on 11/3, velcade held since 06/28/2016 due to CHF  2. Zometa 3.5 mg monthly started on 06/2013, on every 3 months now, last 07/25/2016  Oncology History   [facility]  Staging form: Multiple Myeloma, AJCC 6th Edition   - Clinical: Stage IIIA - Signed by [name] on 09/04/2013      [name]  05/26/2012 Initial Diagnosis    Presenting IgG was 4,040 mg/dL on 06/05/2012 (IgA 35; IgM 34); kappa free light chain 34.4 mg/dL, lambda 0.00, kappa:lambda ratio of 34.75; SPEP with M-spike of 2.60.      06/30/2012 Imaging    Numerous lytic lesions throughout the calvarium.  Lytic lesion within the left lateral scapula.  Findings most compatible with metastases or myeloma. Multiple mid and lower thoracic compression fractures.  Slight compression through the endplates at L3.      1203/50/0938one Marrow Biopsy    Bone marrow biopsy showed 49% plasma cell. Normal classical cytogenetics; however, myeloma FISH panel showed 13q- (intermediate risk)          07/14/2012 - 07/27/2012 Radiation Therapy    Palliative radiation 20 Gy over 10 fractions between to thoracic spine cord compression and symptomatic left scapula lytic lesion.      08/10/2012 -  Chemotherapy    Started SQ Velcade once weekly, 3 weeks on, 1 weeks off; daily Revlimid d1-21, 7 days off; and Dexamethasone 4055mO weekly.       02/12/2013 Bone  Marrow Transplant    [facility]     06/14/2013 Tumor Marker    IgG  1830,  Kappa:lamba ratio, 1.69 (baseline was 34.75 or   M-spike 0.28 (baseline of 2.6 or  89.3% of baseline).        06/25/2013 -  Chemotherapy    Started zometa 3.5 mg monthly       09/01/2013 -  Chemotherapy    Maintenance therapy with revlimid 5mg33mily for 14 days and then off for 7 (decreased from 21 days on and 7 days off based on neutropenia).       09/13/2013 - 09/17/2014 Hospital Admission    Hospital admission for pneumonia      12/20/2013 Treatment Plan Change    Maintenance therapy decreased to 2.5 mg daily for 21 days and then off for 7 days based on low counts.       01/25/2014 Tumor Marker    IgG 1360 M spike 0.5      03/01/2014 - 03/05/2014 Hospital Ad[facility], received thrombolytic therapy, cath showed 60% prox RCA, mid-distal RCA 50%, Echo normal, no stents.      04/11/2014 - 08/07/2014 Chemotherapy    Continue Revlimid at 2.5 mg 3 weeks on 1 week off      08/08/2014 - 08/13/2014 Hospital Admission    Hospital admission for pneumonia. Her Revlimid was held.  08/29/2014 - 09/14/2014 Chemotherapy    Maintenance Revlimid restarted      09/14/2014 - 09/17/2014 Hospital Admission    Admitted for pneumonia after coming back from a trip in [name]. Revlimid was held again.      11/13/2014 - 12/25/2015 Chemotherapy    Maintenance Revlimid restarted, changed to 2.'5mg'$  daily, 2 weks on, 1 week off from 12/15/2014, stopped due to disease progression       01/24/2016 Progression    Patient is M protein has gradually increased to 1.0g, repeated a bone marrow biopsy showed plasma cell 10-30%      02/27/2016 -  Chemotherapy    Pomalidomide 4 mg daily on day 1-21, dexamethasone 40 mg on day 1, 8 and 15, every 28 days, started on 02/27/2016, daratumumab weekly started on 03/07/2016, held after first dose, due to  hospitalization and a severe pancytopenia      03/10/2016 - 03/29/2016 Hospital Admission    Pt was admitted for sepsis from pneumonia, and severe pancytopenia. She required ICU stay for a few days due to hypotension, developed b/l pleural effusion required diuretics and b/l thoracentesis. She also required blood and plt transfusion, and prolonged neupogen injection for severe neutropenia. She was discharged home after 3 weeks hospital stay       04/26/2016 -  Chemotherapy    Daratumumab restarted on 04/26/2016, Velcade 1.'3mg'$ /m2 and dexa '20mg'$  weekly start on 11/3      INTERVAL HISTORY:  Shannon Rush 76 y.o. female with a past medical history of MM as detailed above, s/p SCT is here for follow-up. She developed low grade Fever last week, and resolved spontaneously in few hours. She also reports intermittent dizziness when she stands up, it happened several times over the weekend, but has not happened lately. She denies any worsening of her cough, with mild sputum production. Mild dyspnea on exertion is stable, she is able to tolerate routine activities without difficulties. No other new complaints.   MEDICAL HISTORY: Past Medical History:  Diagnosis Date  . CAD (coronary artery disease)    a. inf STEMI (in [place] >>> [facility]) >>> LHC (8/15- [place]):  pRCA 60%, mid to dist RCA 50% >>> med rx  . Cord compression ([place]) 07/13/12   MRI- diffuse myeloma involvement of T-L spine  . History of radiation therapy 07/13/12-07/27/12   spinal cord compression T3-T10,left scapula  . Hx of echocardiogram    Echo (03/02/14-done at [facility]):  Normal LVEF without significant regional wall motion abnormalities. Mild   . Multiple myeloma ([place]) 07/01/2012  . OSTEOPOROSIS 06/11/2010   Multiple compression fractures; and spontaneous fracture of sternum Qualifier: Diagnosis of  By: [name], [name]     . Thoracic kyphosis 07/13/12   per MRI scan  . Unspecified  deficiency anemia      ALLERGIES:  is allergic to zosyn [piperacillin sod-tazobactam so]; levaquin [levofloxacin in d5w]; other; piperacillin-tazobactam in dex; zithromax [azithromycin]; and ciprofloxacin.  MEDICATIONS: has a current medication list which includes the following prescription(s): acetaminophen, acyclovir, aspirin ec, atorvastatin, calcium carbonate, dexamethasone, doxycycline, fluticasone, lisinopril, metoprolol succinate, nitroglycerin, ondansetron, proair hfa, and prochlorperazine, and the following Facility-Administered Medications: sodium chloride, sodium chloride, acetaminophen, acetaminophen, furosemide, furosemide, heparin lock flush, heparin lock flush, heparin lock flush, heparin lock flush, sodium chloride flush, sodium chloride flush, sodium chloride flush, sodium chloride flush, and sodium chloride flush.  SURGICAL HISTORY:  Past Surgical History:  Procedure Laterality Date  . APPENDECTOMY    .  CESAREAN SECTION     x2   . COLONOSCOPY  2007   neg with Dr. [name]  . ELBOW SURGERY    . HIP SURGERY  2009   left  . TUBAL LIGATION      REVIEW OF SYSTEMS:   Constitutional: Denies abnormal weight loss Eyes: Denies blurriness of vision Ears, nose, mouth, throat, and face: Denies mucositis or sore throat,  Respiratory: Denies dyspnea or wheezes  Cardiovascular: Denies palpitation, chest discomfort (+) leg swelling Gastrointestinal:  Denies nausea, heartburn or change in bowel habits Skin: Denies abnormal skin rashes Lymphatics: Denies new lymphadenopathy or easy bruising Neurological:Denies numbness, tingling or new weaknesses Behavioral/Psych: Mood is stable, no new changes (+) depression All other systems were reviewed with the patient and are negative.   PHYSICAL EXAMINATION: ECOG PERFORMANCE STATUS: 1 Her pressure 102/62, heart rate 70, respiratory rate 16, temperature 98.9, pulse ox 96% on room year. GENERAL:alert, no distress and comfortable; easily mobile  to exam table; kyphosis, moderate.  SKIN: skin color, texture, turgor are normal, no rashes or significant lesions except a small healing, dry wound in the right lower leg above his ankle, with surrounding skin erythema and swelling EYES: normal, Conjunctiva are pink, 1 tear/injectionof blood vessel left eye in sclera. OROPHARYNX:no exudate, no erythema and lips, buccal mucosa, and tongue normal, no ONJ NECK: supple, thyroid normal size, non-tender, without nodularity; nodules cervical bilaterally.  LYMPH:  no palpable lymphadenopathy in the cervical, axillary or supraclavicular LUNGS: crackles at the bases bilaterally with normal breathing effort, no wheezes or rhonchi HEART: regular rate & rhythm and no murmurs  ABDOMEN:abdomen soft, non-tender and normal bowel sounds Musculoskeletal:no cyanosis of digits and no clubbing, (+) spine scoliosis  NEURO: alert & oriented x 3 with fluent speech, no focal motor/sensory deficits   Labs:  CBC Latest Ref Rng & Units 08/21/2016 08/08/2016 07/25/2016  WBC 3.9 - 10.3 10e3/uL 9.5 3.5(L) 3.3(L)  Hemoglobin 11.6 - 15.9 g/dL 11.1(L) 11.6 11.0(L)  Hematocrit 34.8 - 46.6 % 33.7(L) 34.5(L) 33.4(L)  Platelets 145 - 400 10e3/uL 137(L) 143(L) 128(L)    CMP Latest Ref Rng & Units 08/21/2016 08/08/2016 07/25/2016  Glucose 70 - 140 mg/dl 99 91 84  BUN 7.0 - 26.0 mg/dL 22.2 21.1 22.3  Creatinine 0.6 - 1.1 mg/dL 0.8 0.8 0.8  Sodium 136 - 145 mEq/L 140 140 141  Potassium 3.5 - 5.1 mEq/L 3.9 4.0 4.0  Chloride 101 - 111 mmol/L - - -  CO2 22 - 29 mEq/L '24 25 24  '$ Calcium 8.4 - 10.4 mg/dL 9.7 9.2 9.4  Total Protein 6.4 - 8.3 g/dL 6.5 6.2(L) 6.0(L)  Total Bilirubin 0.20 - 1.20 mg/dL 0.68 0.70 0.53  Alkaline Phos 40 - 150 U/L 76 69 71  AST 5 - 34 U/L '17 20 21  '$ ALT 0 - 55 U/L '20 18 17    '$ SPEP M-PROTEIN 10/01/2013: 0.18 (nadir)  10/13/2014: 0.6 11/21/2014: 0.5 01/30/2015: 0.6 03/13/2015: 0.6 04/20/2015: 0.5 06/01/2015: 0.5 07/20/2015: 0.7 09/14/2015: 0.7 10/26/2015:  0.8 12/07/2015: 1.0 02/07/2016: 1.1  04/03/2016: 0.2 05/03/2016: 0.2  05/31/2016: 0.1 06/28/2016: 0.1 07/25/2016: 0.1  IgG ([phone]) 10/13/2014: 1710 11/21/2014: 1460 01/30/2015: 1380 03/13/15: 1570 04/20/2015: 1550 06/01/2015: 1590 07/22/2015: 1770 09/14/2015: 1787 10/26/2015: 1898 12/07/2015: 2045 02/07/2016: 1883 04/03/2016: 787 05/03/2016: 685  05/31/2016: 544 06/28/2016: 463 07/25/2016: 455  Serum kappa, lambda light chains, and ratio  11/21/2014: 3.95, 2.08, 1.90  01/30/2015: 3.92, 2.64, 1.48 03/13/2015: 3.55, 2.17, 1.64 04/20/15: 4.52, 1.78, 2.54 06/01/2015: 4.82, 1.75, 2.75 07/20/2015: 8.71, 2.43,  3.58 09/14/2015: 9.17, 2.02, 4.55 10/26/2015: 11.4, 1.75, 6.42 12/07/2015: 1.54, 1.83, 8.44 02/07/2016: 1.93, 1.42, 13.6 04/03/2016: 0.96, 0.95, 1.01  05/03/2016: 0.71, 0.34, 2.09  05/31/2016: 0.48, 0.26, 1.85 06/28/2016: 0.52, 0.60, 0.87  24 hr urine PEP (total protein, M-spike) 10/13/2014: (-) 02/12/2016: '265mg'$ , '68mg'$  04/05/2016: 114 mg, (-)  PATHOLOGY REPORT  Diagnosis 01/24/2016 Bone Marrow, Aspirate,Biopsy, and Clot, RT iliac and core - VARIABLY CELLULAR BONE MARROW WITH PLASMA CELL NEOPLASM. - SEE COMMENT. PERIPHERAL BLOOD: - MACROCYTIC ANEMIA. Diagnosis Note The bone marrow is variably cellular with trilineage hematopoiesis and non specific myeloid changes. In this background, the plasma cell component is variable ranging from less than 10 % in many areas to 30% in some aspirate particles with relatively low cellularity. Foci of interstitial infiltrates and small clusters are also seen in the clot section. Immunohistochemical stains show kappa light chain restriction in atypical plasma cell clusters and infiltrates consistent with plasma cell neoplasm. Correlation with cytogenetic and FISH studies is recommended. (BNS:gt, 01-26-16)  Diagnosis 03/28/16 PLEURAL FLUID, LEFT (SPECIMEN 1 OF 1 COLLECTED 03/28/16): REACTIVE MESOTHELIAL CELLS  RADIOGRAPHIC STUDIES:  DG Chest 2 View  06/28/16 IMPRESSION: Chronic changes without acute abnormality.   ASSESSMENT: Shannon Rush 76 y.o. female with a history of Multiple Myeloma s/p autotransplant in August 2014 followed by maintenence low dose Revlimid therapy, and relapsed in 01/2016  PLAN:   1. IgG kappa Multiple myeloma s/p Auto SCT, intermediate risk, relapsed in 01/2016 --MM markers demonstrate a very good initial response (M protein baseline 2.6, down to 0.18 after induction chemo and transplant, stable around 0.5 since July 2015, but has gradually increased lately),  she unfortunately relapsed in July 2017 -I previously discussed her repeated bone marrow biopsy from 02/01/2016, which showed 10-30% plasma cells in her marrow -I agree with Dr. Kendell Bane recommendation to stop revlimid and change to Daratumumab, Pomalidomide and dexamethasone due to her disease relapse. I don't think she has resistance to Revlimid, she has been on very small dose of Revlimid due to her prior history of multiple infections. -She started first cycle DaraPD in 02/2016, but in 2 weeks she developed severe pancytopenia, pneumonia with sepsis required 3 weeks hospital stay. She did have a very nice response to the treatment, her M-protein has significantly reduced from 1.2 to 0.2, light chain levels normalized even with half cycle treatment  -I recommended her to change regiment to DaraVD, and started her on dara every 2 weeks for 2 doses, she tolerated this very well -Her previous echo in December 2017 showed a significant drop on EF, she is not much symptomatically for CHF, due to the potential contribution of Velcade to her CHF, I'll hold her Velcade for now. She is scheduled to repeat echo next month -Significant cardiotoxicity including CHF has not been reported in [place], I'll continue every 2 weeks, along with dexamethasone 20 mg every other week -lab reviewed, mild leukocytopenia, and anemia, all stable, will proceed dara today, she took dexa  yesterday, hold velcade for noiw  2. Pancytopenia  -secondary to chemo  -Mild leukopenia, no neutropenia, mild anemia stable -Continue monitoring closely  3. CHF, CAD, inferior STEMI in 02/2014 -continue metoprolol and lipitor, Dr. Meda Coffee started her on lisinopril also -follow up with Dr. Meda Coffee  -her previous echo showed EF 25-30%, significantly lower than before. -Another Echo ordered for Feb 2018  4. Osteoporosis -previous bone density scan showed worsening osteoporosis, continue zometa, changed to every 3 months after 03/2015.  -she declined repeating DEXA  -continue  Zometa every 3 months, next due in early April    5. Mild productive cough -Possibly related to her CHF, she had allergy reaction to Levaquin, completed a course of doxycycline. No significant improvement. -Still present, but improving.   Plan -will proceed with aratumumab today and continue every 2 weeks, hold Velcade, she will take dexa the day before dara, will refill for her  -Follow up in 2 weeks with Dara treatment   All questions were answered. The patient knows to call the clinic with any problems, questions or concerns. We can certainly see the patient much sooner if necessary.  I spent 15 minutes counseling the patient face to face. The total time spent in the appointment was 20 minutes.     [name]  08/21/2016

## 2016-08-19 NOTE — Telephone Encounter (Signed)
Called and left a message about the 3/2 appointments

## 2016-08-21 ENCOUNTER — Other Ambulatory Visit (HOSPITAL_BASED_OUTPATIENT_CLINIC_OR_DEPARTMENT_OTHER): Payer: Medicare Other

## 2016-08-21 ENCOUNTER — Ambulatory Visit (HOSPITAL_BASED_OUTPATIENT_CLINIC_OR_DEPARTMENT_OTHER): Payer: Medicare Other | Admitting: Hematology

## 2016-08-21 ENCOUNTER — Ambulatory Visit (HOSPITAL_BASED_OUTPATIENT_CLINIC_OR_DEPARTMENT_OTHER): Payer: Medicare Other

## 2016-08-21 ENCOUNTER — Encounter: Payer: Self-pay | Admitting: Hematology

## 2016-08-21 VITALS — BP 102/52 | HR 70 | Temp 98.9°F | Resp 16

## 2016-08-21 DIAGNOSIS — D61818 Other pancytopenia: Secondary | ICD-10-CM

## 2016-08-21 DIAGNOSIS — Z5112 Encounter for antineoplastic immunotherapy: Secondary | ICD-10-CM

## 2016-08-21 DIAGNOSIS — C9000 Multiple myeloma not having achieved remission: Secondary | ICD-10-CM | POA: Diagnosis not present

## 2016-08-21 DIAGNOSIS — M810 Age-related osteoporosis without current pathological fracture: Secondary | ICD-10-CM

## 2016-08-21 DIAGNOSIS — C9002 Multiple myeloma in relapse: Secondary | ICD-10-CM | POA: Diagnosis not present

## 2016-08-21 DIAGNOSIS — I252 Old myocardial infarction: Secondary | ICD-10-CM | POA: Diagnosis not present

## 2016-08-21 DIAGNOSIS — R05 Cough: Secondary | ICD-10-CM | POA: Diagnosis not present

## 2016-08-21 LAB — COMPREHENSIVE METABOLIC PANEL
ALK PHOS: 76 U/L (ref 40–150)
ALT: 20 U/L (ref 0–55)
ANION GAP: 11 meq/L (ref 3–11)
AST: 17 U/L (ref 5–34)
Albumin: 4 g/dL (ref 3.5–5.0)
BUN: 22.2 mg/dL (ref 7.0–26.0)
CO2: 24 meq/L (ref 22–29)
Calcium: 9.7 mg/dL (ref 8.4–10.4)
Chloride: 105 mEq/L (ref 98–109)
Creatinine: 0.8 mg/dL (ref 0.6–1.1)
EGFR: 77 mL/min/{1.73_m2} — AB (ref >90)
GLUCOSE: 99 mg/dL (ref 70–140)
POTASSIUM: 3.9 meq/L (ref 3.5–5.1)
SODIUM: 140 meq/L (ref 136–145)
Total Bilirubin: 0.68 mg/dL (ref 0.20–1.20)
Total Protein: 6.5 g/dL (ref 6.4–8.3)

## 2016-08-21 LAB — CBC WITH DIFFERENTIAL/PLATELET
BASO%: 0 % (ref 0.0–2.0)
BASOS ABS: 0 10*3/uL (ref 0.0–0.1)
EOS%: 0 % (ref 0.0–7.0)
Eosinophils Absolute: 0 10*3/uL (ref 0.0–0.5)
HEMATOCRIT: 33.7 % — AB (ref 34.8–46.6)
HEMOGLOBIN: 11.1 g/dL — AB (ref 11.6–15.9)
LYMPH#: 0.4 10*3/uL — AB (ref 0.9–3.3)
LYMPH%: 4.1 % — ABNORMAL LOW (ref 14.0–49.7)
MCH: 36 pg — ABNORMAL HIGH (ref 25.1–34.0)
MCHC: 32.9 g/dL (ref 31.5–36.0)
MCV: 109.4 fL — ABNORMAL HIGH (ref 79.5–101.0)
MONO#: 0.5 10*3/uL (ref 0.1–0.9)
MONO%: 5.3 % (ref 0.0–14.0)
NEUT#: 8.6 10*3/uL — ABNORMAL HIGH (ref 1.5–6.5)
NEUT%: 90.6 % — AB (ref 38.4–76.8)
PLATELETS: 137 10*3/uL — AB (ref 145–400)
RBC: 3.08 10*6/uL — ABNORMAL LOW (ref 3.70–5.45)
RDW: 14.2 % (ref 11.2–14.5)
WBC: 9.5 10*3/uL (ref 3.9–10.3)

## 2016-08-21 MED ORDER — ACETAMINOPHEN 325 MG PO TABS
650.0000 mg | ORAL_TABLET | Freq: Once | ORAL | Status: AC
  Administered 2016-08-21: 650 mg via ORAL

## 2016-08-21 MED ORDER — SODIUM CHLORIDE 0.9 % IV SOLN
16.0000 mg/kg | Freq: Once | INTRAVENOUS | Status: AC
  Administered 2016-08-21: 900 mg via INTRAVENOUS
  Filled 2016-08-21: qty 5

## 2016-08-21 MED ORDER — MONTELUKAST SODIUM 10 MG PO TABS
ORAL_TABLET | ORAL | Status: AC
  Filled 2016-08-21: qty 1

## 2016-08-21 MED ORDER — PROCHLORPERAZINE MALEATE 10 MG PO TABS
10.0000 mg | ORAL_TABLET | Freq: Once | ORAL | Status: AC
  Administered 2016-08-21: 10 mg via ORAL

## 2016-08-21 MED ORDER — PROCHLORPERAZINE MALEATE 10 MG PO TABS
ORAL_TABLET | ORAL | Status: AC
  Filled 2016-08-21: qty 1

## 2016-08-21 MED ORDER — METHYLPREDNISOLONE SODIUM SUCC 125 MG IJ SOLR
125.0000 mg | Freq: Once | INTRAMUSCULAR | Status: AC
  Administered 2016-08-21: 125 mg via INTRAVENOUS

## 2016-08-21 MED ORDER — DEXAMETHASONE 4 MG PO TABS: ORAL_TABLET | ORAL | 1 refills | Status: DC

## 2016-08-21 MED ORDER — METHYLPREDNISOLONE SODIUM SUCC 125 MG IJ SOLR
INTRAMUSCULAR | Status: AC
  Filled 2016-08-21: qty 2

## 2016-08-21 MED ORDER — SODIUM CHLORIDE 0.9 % IV SOLN
Freq: Once | INTRAVENOUS | Status: AC
  Administered 2016-08-21: 10:00:00 via INTRAVENOUS

## 2016-08-21 MED ORDER — DIPHENHYDRAMINE HCL 25 MG PO CAPS
50.0000 mg | ORAL_CAPSULE | Freq: Once | ORAL | Status: AC
  Administered 2016-08-21: 50 mg via ORAL

## 2016-08-21 MED ORDER — DIPHENHYDRAMINE HCL 25 MG PO CAPS
ORAL_CAPSULE | ORAL | Status: AC
  Filled 2016-08-21: qty 2

## 2016-08-21 MED ORDER — MONTELUKAST SODIUM 10 MG PO TABS
10.0000 mg | ORAL_TABLET | Freq: Every day | ORAL | Status: DC
  Administered 2016-08-21: 10 mg via ORAL

## 2016-08-21 MED ORDER — ACETAMINOPHEN 325 MG PO TABS
ORAL_TABLET | ORAL | Status: AC
  Filled 2016-08-21: qty 2

## 2016-08-21 NOTE — Patient Instructions (Signed)
[facility] Discharge Instructions for Patients Receiving Chemotherapy  Today you received the following chemotherapy agents darzalex  To help prevent nausea and vomiting after your treatment, we encourage you to take your nausea medication as directed  If you develop nausea and vomiting that is not controlled by your nausea medication, call the clinic.   BELOW ARE SYMPTOMS THAT SHOULD BE REPORTED IMMEDIATELY:  *FEVER GREATER THAN 100.5 F  *CHILLS WITH OR WITHOUT FEVER  NAUSEA AND VOMITING THAT IS NOT CONTROLLED WITH YOUR NAUSEA MEDICATION  *UNUSUAL SHORTNESS OF BREATH  *UNUSUAL BRUISING OR BLEEDING  TENDERNESS IN MOUTH AND THROAT WITH OR WITHOUT PRESENCE OF ULCERS  *URINARY PROBLEMS  *BOWEL PROBLEMS  UNUSUAL RASH Items with * indicate a potential emergency and should be followed up as soon as possible.  Feel free to call the clinic you have any questions or concerns. The clinic phone number is [phone].  

## 2016-08-22 LAB — KAPPA/LAMBDA LIGHT CHAINS
Ig Kappa Free Light Chain: 4.3 mg/L (ref 3.3–19.4)
Ig Lambda Free Light Chain: 2.7 mg/L — ABNORMAL LOW (ref 5.7–26.3)
KAPPA/LAMBDA FLC RATIO: 1.59 (ref 0.26–1.65)

## 2016-08-23 LAB — MULTIPLE MYELOMA PANEL, SERUM
ALBUMIN SERPL ELPH-MCNC: 3.7 g/dL (ref 2.9–4.4)
Albumin/Glob SerPl: 1.5 (ref 0.7–1.7)
Alpha 1: 0.4 g/dL (ref 0.0–0.4)
Alpha2 Glob SerPl Elph-Mcnc: 0.9 g/dL (ref 0.4–1.0)
B-GLOBULIN SERPL ELPH-MCNC: 0.9 g/dL (ref 0.7–1.3)
Gamma Glob SerPl Elph-Mcnc: 0.4 g/dL (ref 0.4–1.8)
Globulin, Total: 2.6 g/dL (ref 2.2–3.9)
IGA/IMMUNOGLOBULIN A, SERUM: 5 mg/dL — AB (ref 64–422)
IGM (IMMUNOGLOBIN M), SRM: 11 mg/dL — AB (ref 26–217)
IgG, Qn, Serum: 426 mg/dL — ABNORMAL LOW (ref 700–1600)
M PROTEIN SERPL ELPH-MCNC: 0.1 g/dL — AB
TOTAL PROTEIN: 6.3 g/dL (ref 6.0–8.5)

## 2016-08-26 ENCOUNTER — Ambulatory Visit (HOSPITAL_COMMUNITY): Payer: Medicare Other | Attending: Internal Medicine

## 2016-08-26 DIAGNOSIS — D649 Anemia, unspecified: Secondary | ICD-10-CM | POA: Diagnosis not present

## 2016-08-26 DIAGNOSIS — Z79899 Other long term (current) drug therapy: Secondary | ICD-10-CM | POA: Diagnosis not present

## 2016-08-26 DIAGNOSIS — I429 Cardiomyopathy, unspecified: Secondary | ICD-10-CM | POA: Insufficient documentation

## 2016-08-26 DIAGNOSIS — I252 Old myocardial infarction: Secondary | ICD-10-CM | POA: Diagnosis not present

## 2016-08-26 DIAGNOSIS — I517 Cardiomegaly: Secondary | ICD-10-CM | POA: Diagnosis not present

## 2016-08-26 DIAGNOSIS — I509 Heart failure, unspecified: Secondary | ICD-10-CM | POA: Insufficient documentation

## 2016-08-26 DIAGNOSIS — R29898 Other symptoms and signs involving the musculoskeletal system: Secondary | ICD-10-CM | POA: Diagnosis not present

## 2016-08-26 DIAGNOSIS — C9000 Multiple myeloma not having achieved remission: Secondary | ICD-10-CM | POA: Diagnosis not present

## 2016-08-26 DIAGNOSIS — Z09 Encounter for follow-up examination after completed treatment for conditions other than malignant neoplasm: Secondary | ICD-10-CM | POA: Insufficient documentation

## 2016-08-26 DIAGNOSIS — I2510 Atherosclerotic heart disease of native coronary artery without angina pectoris: Secondary | ICD-10-CM | POA: Diagnosis not present

## 2016-08-26 DIAGNOSIS — R609 Edema, unspecified: Secondary | ICD-10-CM | POA: Insufficient documentation

## 2016-08-26 LAB — ECHOCARDIOGRAM COMPLETE
Ao-asc: 28 cm
E decel time: 197 msec
E/e' ratio: 9.36
FS: 26 % — AB (ref 28–44)
IVS/LV PW RATIO, ED: 0.8
LA ID, A-P, ES: 31 mm
LA diam end sys: 31 mm
LA diam index: 1.89 cm/m2
LA vol A4C: 38.1 ml
LA vol index: 29.8 mL/m2
LA vol: 48.9 mL
LV E/e' medial: 9.36
LV E/e'average: 9.36
LV PW d: 10.1 mm — AB (ref 0.6–1.1)
LV e' LATERAL: 6.08 cm/s
LVOT SV: 49 mL
LVOT VTI: 14.2 cm
LVOT area: 3.46 cm2
LVOT diameter: 21 mm
LVOT peak vel: 57.3 cm/s
Lateral S' vel: 8.93 cm/s
MV Dec: 197
MV pk A vel: 73.8 m/s
MV pk E vel: 56.9 m/s
TAPSE: 13.4 mm
TDI e' lateral: 6.08
TDI e' medial: 4.65

## 2016-08-29 ENCOUNTER — Telehealth: Payer: Self-pay | Admitting: Cardiology

## 2016-08-29 NOTE — Telephone Encounter (Signed)
Left a message for the pt to call back to endorse echo results and recommendations per Dr Meda Coffee.

## 2016-08-29 NOTE — Telephone Encounter (Signed)
New message   Pt returning call to [facility] about echo.

## 2016-08-30 ENCOUNTER — Other Ambulatory Visit: Payer: Self-pay

## 2016-08-30 DIAGNOSIS — I429 Cardiomyopathy, unspecified: Secondary | ICD-10-CM

## 2016-08-30 NOTE — Telephone Encounter (Signed)
Follow up    Pt calling about echo results.

## 2016-08-30 NOTE — Telephone Encounter (Signed)
The pt has been given her Echo results and she verbalized understanding. 

## 2016-09-04 NOTE — Progress Notes (Signed)
[name], MD [name] [address]  DIAGNOSIS: Multiple myeloma with relapse  CHIEF COMPLAINS: Follow-up of multiple myeloma  CURRENT TREATMENT:   1. Pomalidomide 4 mg daily on day 1-21, dexamethasone 40 mg on day 1, 8 and 15, every 28 days, started on 02/27/2016, daratumumab weekly started on 03/07/2016, treatment on hold since 03/09/2016 due to hospitalization and severe cytopenia 2. Daratumumab restarted on 04/26/2016, Velcade 1.28m/m2 and dexa 219mweekly start on 11/3, velcade held since 06/28/2016 due to CHF  2. Zometa 3.5 mg monthly started on 06/2013, on every 3 months now, last 07/25/2016  Oncology History   [facility]  Staging form: Multiple Myeloma, AJCC 6th Edition   - Clinical: Stage IIIA - Signed by [name] on 09/04/2013      [name]  05/26/2012 Initial Diagnosis    Presenting IgG was 4,040 mg/dL on 06/05/2012 (IgA 35; IgM 34); kappa free light chain 34.4 mg/dL, lambda 0.00, kappa:lambda ratio of 34.75; SPEP with M-spike of 2.60.      06/30/2012 Imaging    Numerous lytic lesions throughout the calvarium.  Lytic lesion within the left lateral scapula.  Findings most compatible with metastases or myeloma. Multiple mid and lower thoracic compression fractures.  Slight compression through the endplates at L3.      1203/50/0938one Marrow Biopsy    Bone marrow biopsy showed 49% plasma cell. Normal classical cytogenetics; however, myeloma FISH panel showed 13q- (intermediate risk)          07/14/2012 - 07/27/2012 Radiation Therapy    Palliative radiation 20 Gy over 10 fractions between to thoracic spine cord compression and symptomatic left scapula lytic lesion.      08/10/2012 -  Chemotherapy    Started SQ Velcade once weekly, 3 weeks on, 1 weeks off; daily Revlimid d1-21, 7 days off; and Dexamethasone 4055mO weekly.       02/12/2013 Bone  Marrow Transplant    [facility]     06/14/2013 Tumor Marker    IgG  1830,  Kappa:lamba ratio, 1.69 (baseline was 34.75 or   M-spike 0.28 (baseline of 2.6 or  89.3% of baseline).        06/25/2013 -  Chemotherapy    Started zometa 3.5 mg monthly       09/01/2013 -  Chemotherapy    Maintenance therapy with revlimid 5mg33mily for 14 days and then off for 7 (decreased from 21 days on and 7 days off based on neutropenia).       09/13/2013 - 09/17/2014 Hospital Admission    Hospital admission for pneumonia      12/20/2013 Treatment Plan Change    Maintenance therapy decreased to 2.5 mg daily for 21 days and then off for 7 days based on low counts.       01/25/2014 Tumor Marker    IgG 1360 M spike 0.5      03/01/2014 - 03/05/2014 [facility] Ad[facility], received thrombolytic therapy, cath showed 60% prox RCA, mid-distal RCA 50%, Echo normal, no stents.      04/11/2014 - 08/07/2014 Chemotherapy    Continue Revlimid at 2.5 mg 3 weeks on 1 week off      08/08/2014 - 08/13/2014 Hospital Admission    Hospital admission for pneumonia. Her Revlimid was held.  08/29/2014 - 09/14/2014 Chemotherapy    Maintenance Revlimid restarted      09/14/2014 - 09/17/2014 Hospital Admission    Admitted for pneumonia after coming back from a trip in [name]. Revlimid was held again.      11/13/2014 - 12/25/2015 Chemotherapy    Maintenance Revlimid restarted, changed to 2.'5mg'$  daily, 2 weks on, 1 week off from 12/15/2014, stopped due to disease progression       01/24/2016 Progression    Patient is M protein has gradually increased to 1.0g, repeated a bone marrow biopsy showed plasma cell 10-30%      02/27/2016 -  Chemotherapy    Pomalidomide 4 mg daily on day 1-21, dexamethasone 40 mg on day 1, 8 and 15, every 28 days, started on 02/27/2016, daratumumab weekly started on 03/07/2016, held after first dose, due to  hospitalization and a severe pancytopenia      03/10/2016 - 03/29/2016 Hospital Admission    Pt was admitted for sepsis from pneumonia, and severe pancytopenia. She required ICU stay for a few days due to hypotension, developed b/l pleural effusion required diuretics and b/l thoracentesis. She also required blood and plt transfusion, and prolonged neupogen injection for severe neutropenia. She was discharged home after 3 weeks hospital stay       04/26/2016 -  Chemotherapy    Daratumumab restarted on 04/26/2016, Velcade 1.'3mg'$ /m2 and dexa '20mg'$  weekly start on 11/3      08/26/2016 Echocardiogram    - Left ventricle: LVEF is approximately 35% with diffuse diffuse   hypokinesis with severe hypokinesis/ akinesis of the   inferior/inferoseptal walls. In comparison to echo images from   December 2017, there does not appear to be significant change The   cavity size was normal. Wall thickness was normal. Doppler   parameters are consistent with abnormal left ventricular   relaxation (grade 1 diastolic dysfunction). - Aortic valve: AV is thickened. 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 Present in echo from December 2017 There was   trivial regurgitation. - Right ventricle: Systolic function was mildly reduced. - Right atrium: The atrium was mildly dilated. - Pericardium, extracardiac: A trivial pericardial effusion was   identified.      INTERVAL HISTORY:  Shannon Rush 76 y.o. female with a past medical history of MM as detailed above, s/p SCT is here for follow-up. She has been doing well. She still has a very mild cough. She has been remaining very active and can still bike. She uses Lasix PRN, but she has only had to take one in the past month for some leg swelling. Denies SOB, fever, pain, or any other concerns. She had an echo done on Monday. She is going on vacation from March 8-15, and will return for treatment on the  16th.   MEDICAL HISTORY: Past Medical History:  Diagnosis Date  . CAD (coronary artery disease)    a. inf STEMI (in [place] >>> [facility]) >>> LHC (8/15- [place]):  pRCA 60%, mid to dist RCA 50% >>> med rx  . Cord compression ([place]) 07/13/12   MRI- diffuse myeloma involvement of T-L spine  . History of radiation therapy 07/13/12-07/27/12   spinal cord compression T3-T10,left scapula  . Hx of echocardiogram    Echo (03/02/14-[facility]):  Normal LVEF without significant regional wall motion abnormalities. Mild   . Multiple myeloma ([place]) 07/01/2012  . OSTEOPOROSIS 06/11/2010  Multiple compression fractures; and spontaneous fracture of sternum Qualifier: Diagnosis of  By: [name], [name]     . Thoracic kyphosis 07/13/12   per MRI scan  . Unspecified deficiency anemia      ALLERGIES:  is allergic to zosyn [piperacillin sod-tazobactam so]; levaquin [levofloxacin in d5w]; other; piperacillin-tazobactam in dex; zithromax [azithromycin]; and ciprofloxacin.  MEDICATIONS: has a current medication list which includes the following prescription(s): acetaminophen, acyclovir, aspirin ec, atorvastatin, calcium carbonate, dexamethasone, doxycycline, fluticasone, lisinopril, metoprolol succinate, nitroglycerin, ondansetron, proair hfa, and prochlorperazine, and the following Facility-Administered Medications: sodium chloride, sodium chloride, acetaminophen, acetaminophen, furosemide, furosemide, heparin lock flush, heparin lock flush, heparin lock flush, heparin lock flush, sodium chloride flush, sodium chloride flush, sodium chloride flush, sodium chloride flush, and sodium chloride flush.  SURGICAL HISTORY:  Past Surgical History:  Procedure Laterality Date  . APPENDECTOMY    . CESAREAN SECTION     x2   . COLONOSCOPY  2007   neg with Dr. [name]  . ELBOW SURGERY    . HIP SURGERY  2009   left  . TUBAL LIGATION      REVIEW OF SYSTEMS:    Constitutional: Denies abnormal weight loss Eyes: Denies blurriness of vision Ears, nose, mouth, throat, and face: Denies mucositis or sore throat,  Respiratory: Denies dyspnea or wheezes (+) cough Cardiovascular: Denies palpitation, chest discomfort (+) leg swelling Gastrointestinal:  Denies nausea, heartburn or change in bowel habits Skin: Denies abnormal skin rashes Lymphatics: Denies new lymphadenopathy or easy bruising Neurological:Denies numbness, tingling or new weaknesses Behavioral/Psych: Mood is stable, no new changes  All other systems were reviewed with the patient and are negative.   PHYSICAL EXAMINATION: ECOG PERFORMANCE STATUS: 1  Vitals with BMI 09/05/2016  Height   Weight 127 lbs 6 oz  BMI   Systolic 354  Diastolic 61  Pulse 76  Respirations 18   GENERAL:alert, no distress and comfortable; easily mobile to exam table; kyphosis, moderate.  SKIN: skin color, texture, turgor are normal, no rashes or significant lesions except a small healing, dry wound in the right lower leg above his ankle, with surrounding skin erythema and swelling EYES: normal, Conjunctiva are pink, 1 tear/injectionof blood vessel left eye in sclera. OROPHARYNX:no exudate, no erythema and lips, buccal mucosa, and tongue normal, no ONJ NECK: supple, thyroid normal size, non-tender, without nodularity; nodules cervical bilaterally.  LYMPH:  no palpable lymphadenopathy in the cervical, axillary or supraclavicular LUNGS: crackles at the bases bilaterally with normal breathing effort, no wheezes or rhonchi HEART: regular rate & rhythm and no murmurs  ABDOMEN:abdomen soft, non-tender and normal bowel sounds Musculoskeletal:no cyanosis of digits and no clubbing, (+) spine scoliosis  NEURO: alert & oriented x 3 with fluent speech, no focal motor/sensory deficits   Labs:  CBC Latest Ref Rng & Units 09/05/2016 08/21/2016 08/08/2016  WBC 3.9 - 10.3 10e3/uL 6.2 9.5 3.5(L)  Hemoglobin 11.6 - 15.9 g/dL 10.0(L)  11.1(L) 11.6  Hematocrit 34.8 - 46.6 % 28.9(L) 33.7(L) 34.5(L)  Platelets 145 - 400 10e3/uL 148 137(L) 143(L)    CMP Latest Ref Rng & Units 09/05/2016 08/21/2016 08/21/2016  Glucose 70 - 140 mg/dl 87 99 -  BUN 7.0 - 26.0 mg/dL 31.2(H) 22.2 -  Creatinine 0.6 - 1.1 mg/dL 0.8 0.8 -  Sodium 136 - 145 mEq/L 139 140 -  Potassium 3.5 - 5.1 mEq/L 4.0 3.9 -  Chloride 101 - 111 mmol/L - - -  CO2 22 - 29 mEq/L 20(L) 24 -  Calcium 8.4 - 10.4  mg/dL 9.4 9.7 -  Total Protein 6.4 - 8.3 g/dL 6.1(L) 6.5 6.3  Total Bilirubin 0.20 - 1.20 mg/dL 0.69 0.68 -  Alkaline Phos 40 - 150 U/L 56 76 -  AST 5 - 34 U/L 20 17 -  ALT 0 - 55 U/L 19 20 -    SPEP M-PROTEIN 10/01/2013: 0.18 (nadir)  10/13/2014: 0.6 11/21/2014: 0.5 01/30/2015: 0.6 03/13/2015: 0.6 04/20/2015: 0.5 06/01/2015: 0.5 07/20/2015: 0.7 09/14/2015: 0.7 10/26/2015: 0.8 12/07/2015: 1.0 02/07/2016: 1.1  04/03/2016: 0.2 05/03/2016: 0.2  05/31/2016: 0.1 06/28/2016: 0.1 07/25/2016: 0.1 08/21/2016: 0.1  IgG ([phone]) 10/13/2014: 1710 11/21/2014: 1460 01/30/2015: 1380 03/13/15: 1570 04/20/2015: 1550 06/01/2015: 1590 07/22/2015: 1770 09/14/2015: 1787 10/26/2015: 1898 12/07/2015: 2045 02/07/2016: 1883 04/03/2016: 787 05/03/2016: 685  05/31/2016: 544 06/28/2016: 463 07/25/2016: 455 08/21/2016: 426  Serum kappa, lambda light chains, and ratio  11/21/2014: 3.95, 2.08, 1.90  01/30/2015: 3.92, 2.64, 1.48 03/13/2015: 3.55, 2.17, 1.64 04/20/15: 4.52, 1.78, 2.54 06/01/2015: 4.82, 1.75, 2.75 07/20/2015: 8.71, 2.43, 3.58 09/14/2015: 9.17, 2.02, 4.55 10/26/2015: 11.4, 1.75, 6.42 12/07/2015: 1.54, 1.83, 8.44 02/07/2016: 1.93, 1.42, 13.6 04/03/2016: 0.96, 0.95, 1.01  05/03/2016: 0.71, 0.34, 2.09  05/31/2016: 0.48, 0.26, 1.85 06/28/2016: 0.52, 0.60, 0.87  24 hr urine PEP (total protein, M-spike) 10/13/2014: (-) 02/12/2016: '265mg'$ , '68mg'$  04/05/2016: 114 mg, (-)  PATHOLOGY REPORT   Diagnosis 01/24/2016 Bone Marrow, Aspirate,Biopsy, and Clot, RT iliac and core - VARIABLY CELLULAR  BONE MARROW WITH PLASMA CELL NEOPLASM. - SEE COMMENT. PERIPHERAL BLOOD: - MACROCYTIC ANEMIA. Diagnosis Note The bone marrow is variably cellular with trilineage hematopoiesis and non specific myeloid changes. In this background, the plasma cell component is variable ranging from less than 10 % in many areas to 30% in some aspirate particles with relatively low cellularity. Foci of interstitial infiltrates and small clusters are also seen in the clot section. Immunohistochemical stains show kappa light chain restriction in atypical plasma cell clusters and infiltrates consistent with plasma cell neoplasm. Correlation with cytogenetic and FISH studies is recommended. (BNS:gt, 01-26-16)  Diagnosis 03/28/16 PLEURAL FLUID, LEFT (SPECIMEN 1 OF 1 COLLECTED 03/28/16): REACTIVE MESOTHELIAL CELLS  RADIOGRAPHIC STUDIES:  Echo 08/26/2016 - Left ventricle: LVEF is approximately 35% with diffuse diffuse   hypokinesis with severe hypokinesis/ akinesis of the   inferior/inferoseptal walls. In comparison to echo images from   December 2017, there does not appear to be significant change The   cavity size was normal. Wall thickness was normal. Doppler   parameters are consistent with abnormal left ventricular   relaxation (grade 1 diastolic dysfunction). - Aortic valve: AV is thickened. 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 Present in echo from December 2017 There was   trivial regurgitation. - Right ventricle: Systolic function was mildly reduced. - Right atrium: The atrium was mildly dilated. - Pericardium, extracardiac: A trivial pericardial effusion was   identified.  ASSESSMENT: Shannon Rush 76 y.o. female with a history of Multiple Myeloma s/p autotransplant in August 2014 followed by maintenence low dose Revlimid therapy, and relapsed in 01/2016  PLAN:   1. IgG kappa Multiple myeloma s/p Auto SCT, intermediate risk, relapsed in  01/2016 --MM markers demonstrate a very good initial response (M protein baseline 2.6, down to 0.18 after induction chemo and transplant, stable around 0.5 since July 2015, but has gradually increased lately),  she unfortunately relapsed in July 2017 -I previously discussed her repeated bone marrow biopsy from 02/01/2016, which showed 10-30% plasma cells in her marrow -I agree with  Dr. Kendell Bane recommendation to stop revlimid and change to Daratumumab, Pomalidomide and dexamethasone due to her disease relapse. I don't think she has resistance to Revlimid, she has been on very small dose of Revlimid due to her prior history of multiple infections. -She started first cycle DaraPD in 02/2016, but in 2 weeks she developed severe pancytopenia, pneumonia with sepsis required 3 weeks hospital stay. She did have a very nice response to the treatment, her M-protein has significantly reduced from 1.2 to 0.2, light chain levels normalized even with half cycle treatment  -I recommended her to change regiment to DaraVD, and started her on dara every 2 weeks for 2 doses, she tolerated this very well -Her previous echo in December 2017 showed a significant drop on EF, she is not much symptomatically for CHF, due to the potential contribution of Velcade to her CHF, I'll hold her Velcade for now. Her repeated echo in February 2018 showed improved EF 35%. -Significant cardiotoxicity including CHF has not been reported in [place], I'll continue every 2 weeks, along with dexamethasone 20 mg every other week -lab reviewed, mild anemia, stable, will proceed dara today -She hasn't had Velcade since December 2017. Will continue holding it for now    2. Pancytopenia  -secondary to chemo  -Mild leukopenia, no neutropenia, mild anemia stable -Continue monitoring closely  3. CHF, CAD, inferior STEMI in 02/2014 -continue metoprolol and lipitor, Dr. Meda Coffee started her on lisinopril also -follow up with Dr. Meda Coffee  -her previous  echo showed EF 25-30%, significantly lower than before. -Reviewed the echo with the patient.   4. Osteoporosis -previous bone density scan showed worsening osteoporosis, continue zometa, changed to every 3 months after 03/2015.  -she declined repeating DEXA  -continue Zometa every 3 months, next due in early April    5. Mild productive cough -Possibly related to her CHF, she had allergy reaction to Levaquin, completed a course of doxycycline. No significant improvement. -Still present, but improving.   Plan -will proceed with aratumumab today and continue every 2 weeks, hold Velcade, she will take dexa the day before dara -Due to her, upper coming trip to [place], we'll postpone her next treatment for one week. Follow up March 16 with Dara treatment   All questions were answered. The patient knows to call the clinic with any problems, questions or concerns. We can certainly see the patient much sooner if necessary.  I spent 20 minutes counseling the patient face to face. The total time spent in the appointment was 25 minutes.  This document serves as a record of services personally performed by [name], MD. It was created on her behalf by Martinique Casey, a trained medical scribe. The creation of this record is based on the scribe's personal observations and the provider's statements to them. This document has been checked and approved by the attending provider.  I have reviewed the above documentation for accuracy and completeness and I agree with the above.   [name]  09/05/2016

## 2016-09-05 ENCOUNTER — Ambulatory Visit (HOSPITAL_BASED_OUTPATIENT_CLINIC_OR_DEPARTMENT_OTHER): Payer: Medicare Other | Admitting: Hematology

## 2016-09-05 ENCOUNTER — Encounter: Payer: Self-pay | Admitting: Hematology

## 2016-09-05 ENCOUNTER — Ambulatory Visit (HOSPITAL_BASED_OUTPATIENT_CLINIC_OR_DEPARTMENT_OTHER): Payer: Medicare Other

## 2016-09-05 ENCOUNTER — Other Ambulatory Visit (HOSPITAL_BASED_OUTPATIENT_CLINIC_OR_DEPARTMENT_OTHER): Payer: Medicare Other

## 2016-09-05 VITALS — BP 109/61 | HR 76 | Temp 98.6°F | Resp 18 | Wt 127.4 lb

## 2016-09-05 VITALS — BP 109/64 | HR 68 | Temp 98.4°F | Resp 16

## 2016-09-05 DIAGNOSIS — I252 Old myocardial infarction: Secondary | ICD-10-CM | POA: Diagnosis not present

## 2016-09-05 DIAGNOSIS — C9002 Multiple myeloma in relapse: Secondary | ICD-10-CM

## 2016-09-05 DIAGNOSIS — D6481 Anemia due to antineoplastic chemotherapy: Secondary | ICD-10-CM | POA: Diagnosis not present

## 2016-09-05 DIAGNOSIS — T451X5A Adverse effect of antineoplastic and immunosuppressive drugs, initial encounter: Secondary | ICD-10-CM

## 2016-09-05 DIAGNOSIS — M818 Other osteoporosis without current pathological fracture: Secondary | ICD-10-CM | POA: Diagnosis not present

## 2016-09-05 DIAGNOSIS — C9000 Multiple myeloma not having achieved remission: Secondary | ICD-10-CM

## 2016-09-05 DIAGNOSIS — Z5112 Encounter for antineoplastic immunotherapy: Secondary | ICD-10-CM | POA: Diagnosis not present

## 2016-09-05 DIAGNOSIS — I5022 Chronic systolic (congestive) heart failure: Secondary | ICD-10-CM

## 2016-09-05 LAB — COMPREHENSIVE METABOLIC PANEL
ALK PHOS: 56 U/L (ref 40–150)
ALT: 19 U/L (ref 0–55)
ANION GAP: 11 meq/L (ref 3–11)
AST: 20 U/L (ref 5–34)
Albumin: 4 g/dL (ref 3.5–5.0)
BILIRUBIN TOTAL: 0.69 mg/dL (ref 0.20–1.20)
BUN: 31.2 mg/dL — ABNORMAL HIGH (ref 7.0–26.0)
CALCIUM: 9.4 mg/dL (ref 8.4–10.4)
CO2: 20 mEq/L — ABNORMAL LOW (ref 22–29)
Chloride: 108 mEq/L (ref 98–109)
Creatinine: 0.8 mg/dL (ref 0.6–1.1)
EGFR: 69 mL/min/{1.73_m2} — AB (ref >90)
Glucose: 87 mg/dl (ref 70–140)
Potassium: 4 mEq/L (ref 3.5–5.1)
Sodium: 139 mEq/L (ref 136–145)
Total Protein: 6.1 g/dL — ABNORMAL LOW (ref 6.4–8.3)

## 2016-09-05 LAB — CBC WITH DIFFERENTIAL/PLATELET
BASO%: 0.3 % (ref 0.0–2.0)
Basophils Absolute: 0 10*3/uL (ref 0.0–0.1)
EOS%: 0.1 % (ref 0.0–7.0)
Eosinophils Absolute: 0 10*3/uL (ref 0.0–0.5)
HEMATOCRIT: 28.9 % — AB (ref 34.8–46.6)
HGB: 10 g/dL — ABNORMAL LOW (ref 11.6–15.9)
LYMPH#: 0.6 10*3/uL — AB (ref 0.9–3.3)
LYMPH%: 9.3 % — ABNORMAL LOW (ref 14.0–49.7)
MCH: 37.1 pg — ABNORMAL HIGH (ref 25.1–34.0)
MCHC: 34.6 g/dL (ref 31.5–36.0)
MCV: 106.9 fL — ABNORMAL HIGH (ref 79.5–101.0)
MONO#: 0.6 10*3/uL (ref 0.1–0.9)
MONO%: 9.2 % (ref 0.0–14.0)
NEUT%: 81.1 % — ABNORMAL HIGH (ref 38.4–76.8)
NEUTROS ABS: 5.1 10*3/uL (ref 1.5–6.5)
PLATELETS: 148 10*3/uL (ref 145–400)
RBC: 2.7 10*6/uL — AB (ref 3.70–5.45)
RDW: 14.4 % (ref 11.2–14.5)
WBC: 6.2 10*3/uL (ref 3.9–10.3)

## 2016-09-05 MED ORDER — MONTELUKAST SODIUM 10 MG PO TABS: 10.0000 mg | ORAL_TABLET | Freq: Every day | ORAL | Status: DC

## 2016-09-05 MED ORDER — MONTELUKAST SODIUM 10 MG PO TABS
10.0000 mg | ORAL_TABLET | Freq: Once | ORAL | Status: AC
  Administered 2016-09-05: 10 mg via ORAL

## 2016-09-05 MED ORDER — METHYLPREDNISOLONE SODIUM SUCC 125 MG IJ SOLR
INTRAMUSCULAR | Status: AC
  Filled 2016-09-05: qty 2

## 2016-09-05 MED ORDER — PROCHLORPERAZINE MALEATE 10 MG PO TABS
ORAL_TABLET | ORAL | Status: AC
  Filled 2016-09-05: qty 1

## 2016-09-05 MED ORDER — SODIUM CHLORIDE 0.9 % IV SOLN
Freq: Once | INTRAVENOUS | Status: AC
  Administered 2016-09-05: 10:00:00 via INTRAVENOUS

## 2016-09-05 MED ORDER — ACETAMINOPHEN 325 MG PO TABS
650.0000 mg | ORAL_TABLET | Freq: Once | ORAL | Status: AC
  Administered 2016-09-05: 650 mg via ORAL

## 2016-09-05 MED ORDER — PROCHLORPERAZINE MALEATE 10 MG PO TABS
10.0000 mg | ORAL_TABLET | Freq: Once | ORAL | Status: AC
  Administered 2016-09-05: 10 mg via ORAL

## 2016-09-05 MED ORDER — METHYLPREDNISOLONE SODIUM SUCC 125 MG IJ SOLR
125.0000 mg | Freq: Once | INTRAMUSCULAR | Status: AC
  Administered 2016-09-05: 125 mg via INTRAVENOUS

## 2016-09-05 MED ORDER — BORTEZOMIB CHEMO SQ INJECTION 3.5 MG (2.5MG/ML): 1.3000 mg/m2 | Freq: Once | INTRAMUSCULAR | Status: DC

## 2016-09-05 MED ORDER — DIPHENHYDRAMINE HCL 25 MG PO CAPS
50.0000 mg | ORAL_CAPSULE | Freq: Once | ORAL | Status: AC
  Administered 2016-09-05: 50 mg via ORAL

## 2016-09-05 MED ORDER — ACETAMINOPHEN 325 MG PO TABS
ORAL_TABLET | ORAL | Status: AC
  Filled 2016-09-05: qty 2

## 2016-09-05 MED ORDER — SODIUM CHLORIDE 0.9 % IV SOLN
16.0000 mg/kg | Freq: Once | INTRAVENOUS | Status: AC
  Administered 2016-09-05: 900 mg via INTRAVENOUS
  Filled 2016-09-05: qty 5

## 2016-09-05 MED ORDER — MONTELUKAST SODIUM 10 MG PO TABS
ORAL_TABLET | ORAL | Status: AC
  Filled 2016-09-05: qty 1

## 2016-09-05 MED ORDER — DIPHENHYDRAMINE HCL 25 MG PO CAPS
ORAL_CAPSULE | ORAL | Status: AC
  Filled 2016-09-05: qty 2

## 2016-09-05 NOTE — Patient Instructions (Signed)
[place] Discharge Instructions for Patients Receiving Chemotherapy  Today you received the following chemotherapy agents Velcade and Darzalex.  To help prevent nausea and vomiting after your treatment, we encourage you to take your nausea medication.   If you develop nausea and vomiting that is not controlled by your nausea medication, call the clinic.   BELOW ARE SYMPTOMS THAT SHOULD BE REPORTED IMMEDIATELY:  *FEVER GREATER THAN 100.5 F  *CHILLS WITH OR WITHOUT FEVER  NAUSEA AND VOMITING THAT IS NOT CONTROLLED WITH YOUR NAUSEA MEDICATION  *UNUSUAL SHORTNESS OF BREATH  *UNUSUAL BRUISING OR BLEEDING  TENDERNESS IN MOUTH AND THROAT WITH OR WITHOUT PRESENCE OF ULCERS  *URINARY PROBLEMS  *BOWEL PROBLEMS  UNUSUAL RASH Items with * indicate a potential emergency and should be followed up as soon as possible.  Feel free to call the clinic you have any questions or concerns. The clinic phone number is [phone].  Please show the [place] at check-in to the Emergency Department and triage nurse.

## 2016-09-06 ENCOUNTER — Telehealth: Payer: Self-pay | Admitting: Hematology

## 2016-09-06 NOTE — Telephone Encounter (Signed)
lvm re 3/16 appt date/times per LOS

## 2016-09-06 NOTE — Telephone Encounter (Signed)
No los per 08/21/16 los

## 2016-09-09 ENCOUNTER — Ambulatory Visit (INDEPENDENT_AMBULATORY_CARE_PROVIDER_SITE_OTHER): Payer: Medicare Other | Admitting: Cardiology

## 2016-09-09 VITALS — BP 122/62 | HR 88 | Ht 66.0 in | Wt 127.0 lb

## 2016-09-09 DIAGNOSIS — I5022 Chronic systolic (congestive) heart failure: Secondary | ICD-10-CM

## 2016-09-09 DIAGNOSIS — Z79899 Other long term (current) drug therapy: Secondary | ICD-10-CM | POA: Diagnosis not present

## 2016-09-09 DIAGNOSIS — I429 Cardiomyopathy, unspecified: Secondary | ICD-10-CM | POA: Diagnosis not present

## 2016-09-09 DIAGNOSIS — E785 Hyperlipidemia, unspecified: Secondary | ICD-10-CM

## 2016-09-09 DIAGNOSIS — I359 Nonrheumatic aortic valve disorder, unspecified: Secondary | ICD-10-CM

## 2016-09-09 DIAGNOSIS — I2510 Atherosclerotic heart disease of native coronary artery without angina pectoris: Secondary | ICD-10-CM | POA: Diagnosis not present

## 2016-09-09 DIAGNOSIS — C9002 Multiple myeloma in relapse: Secondary | ICD-10-CM

## 2016-09-09 DIAGNOSIS — I358 Other nonrheumatic aortic valve disorders: Secondary | ICD-10-CM

## 2016-09-09 NOTE — Patient Instructions (Signed)
Medication Instructions:   Your physician recommends that you continue on your current medications as directed. Please refer to the Current Medication list given to you today.     Follow-Up:  3 MONTHS WITH DR NELSON       If you need a refill on your cardiac medications before your next appointment, please call your pharmacy.   

## 2016-09-09 NOTE — Progress Notes (Signed)
Cardiology Office Note    Date:  09/09/2016   ID:  Shannon Rush, DOB [birth year], MRN [id number]  PCP:  Shannon Gottron, MD  Cardiologist:  Dr. Meda Rush  CC: follow up- cardiomyopathy and mass on TTE      History of Present Illness:  Shannon Rush is a 76 y.o. female with a history of multiple myeloma s/p bone marrow transplant (followed at [facility] by Dr. Alvie Rush and in [place] by Dr. Juliann Rush), osteoporosis, HLD and CAD: inferior STEMI s/p thrombolytics (02/2014) who presents to clinic for follow up.   Patient was seen by Dr. Johnsie Rush in 12/2012. There were reports of an abnormal echocardiogram. 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   Admitted 8/18-8/22/15 to the [facility] with an inferior STEMI. She received thrombolytics. Cardiac catheterization demonstrated 60% proximal RCA, mid to distal RCA 50% (lesion segmental and thrombotic). Echocardiogram demonstrated normal LV function. Patient was continued on heparin and aspirin. No intervention was performed. Recommendation was for the patient to return to [facility] and have a nuclear stress test in 6 weeks. Patient was on Revlimid (for pancytopenia from chemotherapy) and the DC summary from the hospital in [place] cites this as a possible cause for her STEMI.   She has a known hx of multiple myeloma since 2013 treated with Revlimid, recently switched to pomalidomide, dexamethasone, daratumumab with resultant pancytopenia. She was admitted from 8/28-9/15/17 for sepsis and acute respiratory failure 2/2 HCAP with acute on chronic systolic CHF with pleural effusions. The pt was shocky and required IVF, transfer to ICU, and IV pressors. She was noted to be thrombocytopenic and her Plavix was stopped. She improved initially but has had persistent hypoxia. Chest CT 03/24/16 showed moderate bilateral effusions.  She was given Lasix but this has been stopped secondary to hypotension. Echo done 03/24/16 showed an EF of 45-50% with grade 2 DD. She required bilateral thoracocentesis and IV diuresis. She also received blood transfusions with symptomatic improvement. Discharged on 55m Lasix PRN. Her MM drug regimen was held after this hospitalization due to severe cytopenia. Per Dr. NMeda Rush she will need a repeat 2D echo every 3 months while on this chemo regimen.  I saw her for post hospital follow up on 04/18/16. She was improving but still felt weak. I ordered a TTE in 3 months per Dr. NFrancesca Omanrecommendation. This was performed on 06/27/16 and showed EF down to 25-30% and a highly mobile echodensity attached to the aortic sideof the left coronary leaflet measuring 1 x 0.7 x 0.4 cm. I discussed with Dr. NMeda Coffeewho felt the worsening cardiomyopathy was likely secondary to daratumumab and the patient needed a TEE to further investigate aortic mass.   Daratumumab was restarted on 04/26/2016 and Velcade 1.330mm2 and dexa 2062meekly started on 11/3. Also on Zometa 3.5 mg monthly started on 06/2013 given every 3 months (last 05/03/2016).   I talked to Dr. FenBurr Rush the phone today about this patient. She was recently seen in the office by Dr. FenBurr Rush 06/28/16 and reported a productive cough x2-3 weeks. She was started on Levaquin and her daratumumab was held with plans to restart after the holidays after she had recovered from possible infection. She is currently in a relapse of her MM but had been responding quite well to treatment despite recurrent PNA. Dr. FenBurr Medicoels like she has at least 1 year  to live and thought TEE was appropriate if necessary. She also was willing to hold Daratumumab to see if her EF would improve.   07/24/2016 - this is a very delightful patient who is coming back for follow-up after 1 month. In the last months she feels like she has more energy she denies any lower extremity edema she is no shortness  of breath and in fact she rode her bike here from [facility]. Because of new further decrease of her LVEF from 45-50% to 25-30% she was started on low-dose of lisinopril and continued on Toprol-XL. She has no side effects from these medications. Dr. Burr Rush feels that her decrease in LVEF is secondary to pomalidomide. She denies any chest pain, no orthopnea proximal nocturnal dyspnea no palpitations or syncope.  09/09/2016 -this is 6 weeks follow-up, the patient is feeling very well and is planning trip to [place]. Her most recent echocardiogram showed improvement of LVEF from 25-30% to 35%. She did have a very nice response to the treatment, her M-protein has significantly reduced from 1.2 to 0.2, light chain levels normalized even with half cycle treatment. Dr Burr Rush is changing regiment to DaraVD, and started her on dara every 2 weeks for 2 doses. She denies any palpitations no dizziness or syncope. She continues to exercise and denies any chest pain or shortness of breath she is no lower extremity edema and worse compression stockings. She uses Lasix just as needed only needed to use it once in the last 6 weeks.  Past Medical History:  Diagnosis Date  . CAD (coronary artery disease)    a. inf STEMI (in [place] >>> lytics) >>> LHC (8/15- [place]):  pRCA 60%, mid to dist RCA 50% >>> med rx  . Cord compression ([place]) 07/13/12   MRI- diffuse myeloma involvement of T-L spine  . History of radiation therapy 07/13/12-07/27/12   spinal cord compression T3-T10,left scapula  . Hx of echocardiogram    Echo (03/02/14-done at [facility]):  Normal LVEF without significant regional wall motion abnormalities. Mild   . Multiple myeloma ([place]) 07/01/2012  . OSTEOPOROSIS 06/11/2010   Multiple compression fractures; and spontaneous fracture of sternum Qualifier: Diagnosis of  By: [name], [name]     . Thoracic kyphosis 07/13/12   per MRI scan  . Unspecified deficiency  anemia     Past Surgical History:  Procedure Laterality Date  . APPENDECTOMY    . CESAREAN SECTION     x2   . COLONOSCOPY  2007   neg with Dr. [name]  . ELBOW SURGERY    . HIP SURGERY  2009   left  . TUBAL LIGATION      Current Medications: Outpatient Medications Prior to Visit  Medication Sig Dispense Refill  . acetaminophen (TYLENOL) 500 MG tablet Take 500 mg by mouth every 6 (six) hours as needed for moderate pain (pain). Reported on 07/20/2015    . acyclovir (ZOVIRAX) 400 MG tablet Take 1 tablet (400 mg total) by mouth 2 (two) times daily. 60 tablet 11  . aspirin EC 81 MG tablet Take 1 tablet (81 mg total) by mouth daily. 90 tablet 3  . atorvastatin (LIPITOR) 40 MG tablet TAKE 1 TABLET (40 MG TOTAL) BY MOUTH DAILY. 90 tablet 1  . calcium carbonate (OS-CAL) 600 MG TABS tablet Take 600 mg by mouth daily.    Marland Kitchen dexamethasone (DECADRON) 4 MG tablet Take 5 tab the day before chemo, every 2 weeks 30 tablet  1  . doxycycline (VIBRA-TABS) 100 MG tablet Take 1 tablet (100 mg total) by mouth 2 (two) times daily. 14 tablet 0  . fluticasone (FLONASE) 50 MCG/ACT nasal spray Place 2 sprays into both nostrils daily. 16 g 1  . lisinopril (PRINIVIL,ZESTRIL) 2.5 MG tablet Take 1 tablet (2.5 mg total) by mouth daily. 90 tablet 3  . metoprolol succinate (TOPROL-XL) 25 MG 24 hr tablet TAKE 1 TABLET (25 MG TOTAL) BY MOUTH DAILY. 90 tablet 0  . nitroGLYCERIN (NITROSTAT) 0.4 MG SL tablet Place under the tongue as needed. Reported on 07/20/2015    . ondansetron (ZOFRAN) 8 MG tablet Take 1 tablet (8 mg total) by mouth 2 (two) times daily as needed (Nausea or vomiting). 30 tablet 1  . PROAIR HFA 108 (90 BASE) MCG/ACT inhaler Inhale 2 puffs into the lungs every 6 (six) hours as needed.  2  . prochlorperazine (COMPAZINE) 10 MG tablet Take 1 tablet (10 mg total) by mouth every 6 (six) hours as needed (Nausea or vomiting). 30 tablet 1   Facility-Administered Medications Prior to Visit  Medication Dose Route  Frequency Provider Last Rate Last Dose  . 0.9 %  sodium chloride infusion  250 mL Intravenous Once [name], MD      . 0.9 %  sodium chloride infusion  250 mL Intravenous Once [name], MD      . acetaminophen (TYLENOL) tablet 650 mg  650 mg Oral Once [name], MD      . acetaminophen (TYLENOL) tablet 650 mg  650 mg Oral Once [name], MD      . furosemide (LASIX) injection 10 mg  10 mg Intravenous Once [name], MD      . furosemide (LASIX) injection 10 mg  10 mg Intravenous Once [name], MD      . heparin lock flush 100 unit/mL  500 Units Intracatheter Daily PRN [name], MD      . heparin lock flush 100 unit/mL  250 Units Intracatheter PRN [name], MD      . heparin lock flush 100 unit/mL  500 Units Intracatheter Daily PRN [name], MD      . heparin lock flush 100 unit/mL  250 Units Intracatheter PRN [name], MD      . sodium chloride flush (NS) 0.9 % injection 10 mL  10 mL Intracatheter PRN [name], MD      . sodium chloride flush (NS) 0.9 % injection 10 mL  10 mL Intracatheter PRN [name], MD      . sodium chloride flush (NS) 0.9 % injection 10 mL  10 mL Intracatheter PRN [name], MD      . sodium chloride flush (NS) 0.9 % injection 3 mL  3 mL Intracatheter PRN [name], MD      . sodium chloride flush (NS) 0.9 % injection 3 mL  3 mL Intracatheter PRN [name], MD         Allergies:   Zosyn [piperacillin sod-tazobactam so]; Levaquin [levofloxacin in d5w]; Other; Piperacillin-tazobactam in dex; Zithromax [azithromycin]; and Ciprofloxacin   Social History   Social History  . Marital status: Married    Spouse name: Fritz Pickerel  . Number of children: 2  . Years of education: N/A   Occupational History  . Retired-Pyschology Faculty [employer]   Social History Main Topics  . Smoking status: Never Smoker  . Smokeless tobacco: Never Used  . Alcohol use 3.5 oz/week    7 Standard drinks or  equivalent per week     Comment: 1 glass of wine a day  . Drug use: No  . Sexual  activity: Not on file   Other Topics Concern  . Not on file   Social History Narrative   Health Care POA:    Emergency Contact: spouse, Dezyre Hoefer [phone]   End of Life Plan:    Who lives with you: spouse, 2 story home   Any pets: Dog, "AKU"   Diet: Patient has a varied diet of protein, vegetables, starches.  Limits red meats.   Exercise: Patient exercises atleast 3 times a week for over an hour.   Seatbelts: Patient reports wearing her seatbelt when in vehicle.    Nancy Fetter Exposure/Protection: Patient reports wearing sunscreen daily.   Hobbies: Basketball, gardening, swimming, reading, biking          Family History:  The patient's family history includes Cancer in her brother; Glaucoma in her mother; Heart disease in her father and mother.     ROS:   Please see the history of present illness.    ROS All other systems reviewed and are negative.  PHYSICAL EXAM:   VS:  BP 122/62   Pulse 88   Ht _0  (1.676 m)   Wt 127 lb (57.6 kg)   BMI 20.50 kg/m    GEN: Well nourished, well developed, in no acute distress  HEENT: normal  Neck: no JVD, carotid bruits, or masses Cardiac: RRR; no murmurs, rubs, or gallops,no edema  Respiratory:  clear to auscultation bilaterally, normal work of breathing GI: soft, nontender, nondistended, + BS MS: no deformity or atrophy  Skin: warm and dry, no rash Neuro:  Alert and Oriented x 3, Strength and sensation are intact Psych: euthymic mood, full affect  Wt Readings from Last 3 Encounters:  09/09/16 127 lb (57.6 kg)  09/05/16 127 lb 6 oz (57.8 kg)  08/08/16 125 lb (56.7 kg)    Studies/Labs Reviewed:   EKG:  EKG is NOT ordered today.   Recent Labs: 03/16/2016: Magnesium 1.7 03/25/2016: B Natriuretic Peptide 188.9 09/05/2016: ALT 19; BUN 31.2; Creatinine 0.8; HGB 10.0; Platelets 148; Potassium 4.0; Sodium 139   Lipid Panel    Component Value Date/Time   CHOL 207 (H) 06/02/2009 2018   TRIG 68 06/02/2009 2018   HDL 73 06/02/2009 2018    CHOLHDL 2.8 Ratio 06/02/2009 2018   VLDL 14 06/02/2009 2018   [place] 120 (H) 06/02/2009 2018    Additional studies/ records that were reviewed today include:  2D ECHO: 03/24/2016 LV EF: 45% -   50% Study Conclusions - Left ventricle: The cavity size was normal. Wall thickness was   normal. Systolic function was mildly reduced. The estimated   ejection fraction was in the range of 45% to 50%. Features are   consistent with a pseudonormal left ventricular filling pattern,   with concomitant abnormal relaxation and increased filling   pressure (grade 2 diastolic dysfunction). - Mitral valve: There was mild regurgitation. - Left atrium: The atrium was mildly dilated.  2D ECHO: 06/27/2016 LV EF: 25% -   30% Study Conclusions - Left ventricle: The cavity size was mildly dilated. Systolic   function was severely reduced. The estimated ejection fraction   was in the range of 25% to 30%. Diffuse hypokinesis. Doppler   parameters are consistent with abnormal left ventricular   relaxation (grade 1 diastolic dysfunction). Doppler parameters   are consistent with intermediate end-diastolic filling pressure. - Aortic valve: Trileaflet; normal  thickness leaflets. There was   trivial regurgitation. - Aortic root: The aortic root was normal in size. - Mitral valve: Structurally normal valve. There was mild   regurgitation. - Right ventricle: The cavity size was normal. Wall thickness was   normal. Systolic function was normal. - Right atrium: The atrium was normal in size. - Tricuspid valve: There was trivial regurgitation. - Pulmonic valve: There was trivial regurgitation. - Pulmonary arteries: Systolic pressure was within the normal   range. - Inferior vena cava: The vessel was dilated. The respirophasic   diameter changes were blunted (< 50%), consistent with elevated   central venous pressure. - Pericardium, extracardiac: There was no pericardial effusion. Impressions: - LVEF is  severely decreased estimated at 25-30% with diffuse   hypokinesis.   There is a highly mobile echodensity attached to the aortic side   of the left coronary leaflet measuring 1 x 0.7 x 0.4 cm. The   differential diagnosis includes inferctious endocarditis,   marantic endocarditis, thrombus, papillary fibroelastoma. Further   evaluation with a TEE is recommended.  TTE: 08/2016 Left ventricle: LVEF is approximately 35% with diffuse diffuse   hypokinesis with severe hypokinesis/ akinesis of the   inferior/inferoseptal walls. In comparison to echo images from   December 2017, there does not appear to be significant change The   cavity size was normal. Wall thickness was normal. Doppler   parameters are consistent with abnormal left ventricular   relaxation (grade 1 diastolic dysfunction). - Aortic valve: AV is thickened. 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 Present in echo from December 2017 There was   trivial regurgitation. - Right ventricle: Systolic function was mildly reduced. - Right atrium: The atrium was mildly dilated. - Pericardium, extracardiac: A trivial pericardial effusion was   identified.    ASSESSMENT & PLAN:   LOGEN FOWLE is a 76 y.o. female with a history of multiple myeloma s/p bone marrow transplant (followed at [facility] by Dr. Alvie Rush and in [place] by Dr. Juliann Rush), osteoporosis, HLD and CAD: inferior STEMI s/p thrombolytics (02/2014) who presents to clinic for follow up.   1. Acute on Chronic systolic CHF: EF 02-63% in 03/2016, decreased to 25-30% in December 2017 on velcade and daratuzumab for relapsing multiple myeloma. Previously on Pomalidomide. Dr Burr Rush is changing regiment to DaraVD, and started her on dara every 2 weeks for 2 doses. LVEF improved to 35% in 08/2016, repeat echo scheduled for May 2018. She is asymptomatic and euvolemic.  2. Aortic mobile echodensity: attached to the aortic  side of the left coronary leaflet measuring 1 x 0.7 x 0.4 cm. At first plan was to perform a TEE but then review of previous echo from [facility] in 2014 showed linear mass on aortic valve that is likely the same mass that has been stable over time: No need for TEE.  She has no signs of infective endocarditis.  3. Multiple myeloma s/p bone marrow transplant: (followed at [facility] by Dr. Alvie Rush and in [facility] by Dr. Burr Rush). Chemo as above.   4. HLD: continue statin  5. CAD: inferior STEMI s/p thrombolytics (02/2014). Plavix recent discontinued 2/2 thrombocytopenia but it was resumed at discharge. Continue plavix, statin and BB   6. Easy bruising - stop Plavix, start ASA.   Medication Adjustments/Labs and Tests Ordered: Current medicines are reviewed at length with the patient today.  Concerns regarding medicines are outlined above.  Medication changes, Labs and Tests ordered today  are listed in the Patient Instructions below. Patient Instructions  Medication Instructions:   Your physician recommends that you continue on your current medications as directed. Please refer to the Current Medication list given to you today.    Follow-Up:  3 MONTHS WITH DR Shannon Rush       If you need a refill on your cardiac medications before your next appointment, please call your pharmacy.      Signed, Ena Dawley, MD  09/09/2016 11:17 AM    [address], [place], [place]  [address] Phone: [phone]; Fax: [phone]

## 2016-09-23 NOTE — Progress Notes (Signed)
[name], MD Shannon Rush [id number]  DIAGNOSIS: Multiple myeloma with relapse  CHIEF COMPLAINS: Follow-up of multiple myeloma  CURRENT TREATMENT:   1. Pomalidomide 4 mg daily on day 1-21, dexamethasone 40 mg on day 1, 8 and 15, every 28 days, started on 02/27/2016, daratumumab weekly started on 03/07/2016, treatment on hold since 03/09/2016 due to hospitalization and severe cytopenia 2. Daratumumab restarted on 04/26/2016, Velcade 1.28m/m2 and dexa 219mweekly start on 11/3, velcade held since 06/28/2016 due to CHF  2. Zometa 3.5 mg monthly started on 06/2013, on every 3 months now, last 07/25/2016  Oncology History   [facility]  Staging form: Multiple Myeloma, AJCC 6th Edition   - Clinical: Stage IIIA - Signed by [name] on 09/04/2013      [name]  05/26/2012 Initial Diagnosis    Presenting IgG was 4,040 mg/dL on 06/05/2012 (IgA 35; IgM 34); kappa free light chain 34.4 mg/dL, lambda 0.00, kappa:lambda ratio of 34.75; SPEP with M-spike of 2.60.      06/30/2012 Imaging    Numerous lytic lesions throughout the calvarium.  Lytic lesion within the left lateral scapula.  Findings most compatible with metastases or myeloma. Multiple mid and lower thoracic compression fractures.  Slight compression through the endplates at L3.      1203/50/0938one Marrow Biopsy    Bone marrow biopsy showed 49% plasma cell. Normal classical cytogenetics; however, myeloma FISH panel showed 13q- (intermediate risk)          07/14/2012 - 07/27/2012 Radiation Therapy    Palliative radiation 20 Gy over 10 fractions between to thoracic spine cord compression and symptomatic left scapula lytic lesion.      08/10/2012 -  Chemotherapy    Started SQ Velcade once weekly, 3 weeks on, 1 weeks off; daily Revlimid d1-21, 7 days off; and Dexamethasone 4055mO weekly.       02/12/2013 Bone  Marrow Transplant    [facility]     06/14/2013 Tumor Marker    IgG  1830,  Kappa:lamba ratio, 1.69 (baseline was 34.75 or   M-spike 0.28 (baseline of 2.6 or  89.3% of baseline).        06/25/2013 -  Chemotherapy    Started zometa 3.5 mg monthly       09/01/2013 -  Chemotherapy    Maintenance therapy with revlimid 5mg33mily for 14 days and then off for 7 (decreased from 21 days on and 7 days off based on neutropenia).       09/13/2013 - 09/17/2014 Hospital Admission    Hospital admission for pneumonia      12/20/2013 Treatment Plan Change    Maintenance therapy decreased to 2.5 mg daily for 21 days and then off for 7 days based on low counts.       01/25/2014 Tumor Marker    IgG 1360 M spike 0.5      03/01/2014 - 03/05/2014 Hospital Ad[facility], received thrombolytic therapy, cath showed 60% prox RCA, mid-distal RCA 50%, Echo normal, no stents.      04/11/2014 - 08/07/2014 Chemotherapy    Continue Revlimid at 2.5 mg 3 weeks on 1 week off      08/08/2014 - 08/13/2014 Hospital Admission    Hospital admission for pneumonia. Her Revlimid was held.  08/29/2014 - 09/14/2014 Chemotherapy    Maintenance Revlimid restarted      09/14/2014 - 09/17/2014 Hospital Admission    Admitted for pneumonia after coming back from a trip in [name]. Revlimid was held again.      11/13/2014 - 12/25/2015 Chemotherapy    Maintenance Revlimid restarted, changed to 2.'5mg'$  daily, 2 weks on, 1 week off from 12/15/2014, stopped due to disease progression       01/24/2016 Progression    Patient is M protein has gradually increased to 1.0g, repeated a bone marrow biopsy showed plasma cell 10-30%      02/27/2016 -  Chemotherapy    Pomalidomide 4 mg daily on day 1-21, dexamethasone 40 mg on day 1, 8 and 15, every 28 days, started on 02/27/2016, daratumumab weekly started on 03/07/2016, held after first dose, due to  hospitalization and a severe pancytopenia      03/10/2016 - 03/29/2016 Hospital Admission    Pt was admitted for sepsis from pneumonia, and severe pancytopenia. She required ICU stay for a few days due to hypotension, developed b/l pleural effusion required diuretics and b/l thoracentesis. She also required blood and plt transfusion, and prolonged neupogen injection for severe neutropenia. She was discharged home after 3 weeks hospital stay       04/26/2016 -  Chemotherapy    Daratumumab restarted on 04/26/2016, Velcade 1.'3mg'$ /m2 and dexa '20mg'$  weekly start on 11/3      08/26/2016 Echocardiogram    - Left ventricle: LVEF is approximately 35% with diffuse diffuse   hypokinesis with severe hypokinesis/ akinesis of the   inferior/inferoseptal walls. In comparison to echo images from   December 2017, there does not appear to be significant change The   cavity size was normal. Wall thickness was normal. Doppler   parameters are consistent with abnormal left ventricular   relaxation (grade 1 diastolic dysfunction). - Aortic valve: AV is thickened. 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 Present in echo from December 2017 There was   trivial regurgitation. - Right ventricle: Systolic function was mildly reduced. - Right atrium: The atrium was mildly dilated. - Pericardium, extracardiac: A trivial pericardial effusion was   identified.      INTERVAL HISTORY: Shannon Rush 76 y.o. female with a past medical history of MM as detailed above, s/p SCT is here for follow-up. She is not doing well. Her husband has been having a lot of health problems and they had to come home from their vacation early due to a medical emergency. Denies fatigue, fever, cough, leg swelling, or any other concerns.   MEDICAL HISTORY: Past Medical History:  Diagnosis Date  . CAD (coronary artery disease)    a. inf STEMI (in [place] >>> [facility]) >>> LHC  (8/15- [place]):  pRCA 60%, mid to dist RCA 50% >>> med rx  . Cord compression ([place]) 07/13/12   MRI- diffuse myeloma involvement of T-L spine  . History of radiation therapy 07/13/12-07/27/12   spinal cord compression T3-T10,left scapula  . Hx of echocardiogram    Echo (03/02/14-done at [facility]):  Normal LVEF without significant regional wall motion abnormalities. Mild   . Multiple myeloma ([place]) 07/01/2012  . OSTEOPOROSIS 06/11/2010   Multiple compression fractures; and spontaneous fracture of sternum Qualifier: Diagnosis of  By: [name], [name]     . Thoracic kyphosis 07/13/12   per MRI scan  . Unspecified deficiency  anemia      ALLERGIES:  is allergic to zosyn [piperacillin sod-tazobactam so]; levaquin [levofloxacin in d5w]; other; piperacillin-tazobactam in dex; zithromax [azithromycin]; and ciprofloxacin.  MEDICATIONS: has a current medication list which includes the following prescription(s): acetaminophen, acyclovir, aspirin ec, atorvastatin, calcium carbonate, dexamethasone, doxycycline, fluticasone, lisinopril, metoprolol succinate, nitroglycerin, ondansetron, proair hfa, and prochlorperazine, and the following Facility-Administered Medications: sodium chloride, sodium chloride, acetaminophen, acetaminophen, furosemide, furosemide, heparin lock flush, heparin lock flush, heparin lock flush, heparin lock flush, sodium chloride flush, sodium chloride flush, sodium chloride flush, sodium chloride flush, and sodium chloride flush.  SURGICAL HISTORY:  Past Surgical History:  Procedure Laterality Date  . APPENDECTOMY    . CESAREAN SECTION     x2   . COLONOSCOPY  2007   neg with Dr. Ewing Schlein  . ELBOW SURGERY    . HIP SURGERY  2009   left  . TUBAL LIGATION      REVIEW OF SYSTEMS:   Constitutional: Denies abnormal weight loss Eyes: Denies blurriness of vision Ears, nose, mouth, throat, and face: Denies mucositis or sore throat,   Respiratory: Denies dyspnea or wheezes Cardiovascular: Denies palpitation, chest discomfort  Gastrointestinal:  Denies nausea, heartburn or change in bowel habits Skin: Denies abnormal skin rashes Lymphatics: Denies new lymphadenopathy or easy bruising Neurological:Denies numbness, tingling or new weaknesses Behavioral/Psych: Mood is stable, no new changes (+) anxiety over husband's health All other systems were reviewed with the patient and are negative.   PHYSICAL EXAMINATION: ECOG PERFORMANCE STATUS: 1  BP 116/77 (BP Location: Left Arm, Patient Position: Sitting)   Pulse 77   Temp 97.7 F (36.5 C) (Oral)   Resp 18   Ht 5\' 6"  (1.676 m)   Wt 128 lb 12.8 oz (58.4 kg)   SpO2 100%   BMI 20.79 kg/m   GENERAL:alert, no distress and comfortable; easily mobile to exam table; kyphosis, moderate.  SKIN: skin color, texture, turgor are normal, no rashes or significant lesions except a small healing, dry wound in the right lower leg above his ankle, with surrounding skin erythema and swelling EYES: normal, Conjunctiva are pink, 1 tear/injectionof blood vessel left eye in sclera. OROPHARYNX:no exudate, no erythema and lips, buccal mucosa, and tongue normal, no ONJ NECK: supple, thyroid normal size, non-tender, without nodularity; nodules cervical bilaterally.  LYMPH:  no palpable lymphadenopathy in the cervical, axillary or supraclavicular LUNGS: crackles at the bases bilaterally with normal breathing effort, no wheezes or rhonchi HEART: regular rate & rhythm and no murmurs  ABDOMEN:abdomen soft, non-tender and normal bowel sounds Musculoskeletal:no cyanosis of digits and no clubbing, (+) spine scoliosis  NEURO: alert & oriented x 3 with fluent speech, no focal motor/sensory deficits   Labs:  CBC Latest Ref Rng & Units 09/27/2016 09/05/2016 08/21/2016  WBC 3.9 - 10.3 10e3/uL 4.6 6.2 9.5  Hemoglobin 11.6 - 15.9 g/dL 10/19/2016) 10.0(L) 11.1(L)  Hematocrit 34.8 - 46.6 % 29.1(L) 28.9(L) 33.7(L)   Platelets 145 - 400 10e3/uL 152 148 137(L)    CMP Latest Ref Rng & Units 09/27/2016 09/05/2016 08/21/2016  Glucose 70 - 140 mg/dl 94 87 99  BUN 7.0 - 10/19/2016 mg/dL 28.0 31.2(H) 22.2  Creatinine 0.6 - 1.1 mg/dL 0.8 0.8 0.8  Sodium 36.6 - 145 mEq/L 140 139 140  Potassium 3.5 - 5.1 mEq/L 3.6 4.0 3.9  Chloride 101 - 111 mmol/L - - -  CO2 22 - 29 mEq/L 20(L) 20(L) 24  Calcium 8.4 - 10.4 mg/dL 9.6 9.4 9.7  Total Protein 6.4 - 8.3 g/dL  6.5 6.1(L) 6.5  Total Bilirubin 0.20 - 1.20 mg/dL 0.77 0.69 0.68  Alkaline Phos 40 - 150 U/L 53 56 76  AST 5 - 34 U/L _0 ALT 0 - 55 U/L _1 SPEP M-PROTEIN 10/01/2013: 0.18 (nadir)  10/13/2014: 0.6 11/21/2014: 0.5 01/30/2015: 0.6 03/13/2015: 0.6 04/20/2015: 0.5 06/01/2015: 0.5 07/20/2015: 0.7 09/14/2015: 0.7 10/26/2015: 0.8 12/07/2015: 1.0 02/07/2016: 1.1  04/03/2016: 0.2 05/03/2016: 0.2  05/31/2016: 0.1 06/28/2016: 0.1 07/25/2016: 0.1 08/21/2016: 0.1  IgG [phone]) 10/13/2014: 1710 11/21/2014: 1460 01/30/2015: 1380 03/13/15: 1570 04/20/2015: 1550 06/01/2015: 1590 07/22/2015: 1770 09/14/2015: 1787 10/26/2015: 1898 12/07/2015: 2045 02/07/2016: 1883 04/03/2016: 787 05/03/2016: 685  05/31/2016: 544 06/28/2016: 463 07/25/2016: 455 08/21/2016: 426  Serum kappa, lambda light chains, and ratio  11/21/2014: 3.95, 2.08, 1.90  01/30/2015: 3.92, 2.64, 1.48 03/13/2015: 3.55, 2.17, 1.64 04/20/15: 4.52, 1.78, 2.54 06/01/2015: 4.82, 1.75, 2.75 07/20/2015: 8.71, 2.43, 3.58 09/14/2015: 9.17, 2.02, 4.55 10/26/2015: 11.4, 1.75, 6.42 12/07/2015: 1.54, 1.83, 8.44 02/07/2016: 1.93, 1.42, 13.6 04/03/2016: 0.96, 0.95, 1.01  05/03/2016: 0.71, 0.34, 2.09  05/31/2016: 0.48, 0.26, 1.85 06/28/2016: 0.52, 0.60, 0.87  24 hr urine PEP (total protein, M-spike) 10/13/2014: (-) 02/12/2016: 254m, 659m9/22/2017: 114 mg, (-)  PATHOLOGY REPORT   Diagnosis 01/24/2016 Bone Marrow, Aspirate,Biopsy, and Clot, RT iliac and core - VARIABLY CELLULAR BONE MARROW WITH PLASMA CELL NEOPLASM. - SEE  COMMENT. PERIPHERAL BLOOD: - MACROCYTIC ANEMIA. Diagnosis Note The bone marrow is variably cellular with trilineage hematopoiesis and non specific myeloid changes. In this background, the plasma cell component is variable ranging from less than 10 % in many areas to 30% in some aspirate particles with relatively low cellularity. Foci of interstitial infiltrates and small clusters are also seen in the clot section. Immunohistochemical stains show kappa light chain restriction in atypical plasma cell clusters and infiltrates consistent with plasma cell neoplasm. Correlation with cytogenetic and FISH studies is recommended. (BNS:gt, 7/08-09-2015 Diagnosis 03/28/16 PLEURAL FLUID, LEFT (SPECIMEN 1 OF 1 COLLECTED 03/28/16): REACTIVE MESOTHELIAL CELLS  RADIOGRAPHIC STUDIES:  Echo 08/26/2016 - Left ventricle: LVEF is approximately 35% with diffuse diffuse   hypokinesis with severe hypokinesis/ akinesis of the   inferior/inferoseptal walls. In comparison to echo images from   December 2017, there does not appear to be significant change The   cavity size was normal. Wall thickness was normal. Doppler   parameters are consistent with abnormal left ventricular   relaxation (grade 1 diastolic dysfunction). - Aortic valve: AV is thickened. 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 Present in echo from December 2017 There was   trivial regurgitation. - Right ventricle: Systolic function was mildly reduced. - Right atrium: The atrium was mildly dilated. - Pericardium, extracardiac: A trivial pericardial effusion was   identified.  ASSESSMENT: Shannon SPILLER89.o. female with a history of Multiple Myeloma s/p autotransplant in August 2014 followed by maintenence low dose Revlimid therapy, and relapsed in 01/2016  PLAN:   1. IgG kappa Multiple myeloma s/p Auto SCT, intermediate risk, relapsed in 01/2016 --MM markers demonstrate a very good  initial response (M protein baseline 2.6, down to 0.18 after induction chemo and transplant, stable around 0.5 since July 2015, but has gradually increased lately),  she unfortunately relapsed in July 2017 -I previously discussed her repeated bone marrow biopsy from 02/01/2016, which showed 10-30% plasma cells in her marrow -I agree with Dr. GaKendell Baneecommendation to stop revlimid and change to Daratumumab, Pomalidomide  and dexamethasone due to her disease relapse. I don't think she has resistance to Revlimid, she has been on very small dose of Revlimid due to her prior history of multiple infections. -She started first cycle DaraPD in 02/2016, but in 2 weeks she developed severe pancytopenia, pneumonia with sepsis required 3 weeks hospital stay. She did have a very nice response to the treatment, her M-protein has significantly reduced from 1.2 to 0.2, light chain levels normalized even with half cycle treatment  -I recommended her to change regiment to DaraVD, and started her on dara every 2 weeks for 2 doses, she tolerated this very well -Her previous echo in December 2017 showed a significant drop on EF, she is not much symptomatically for CHF, due to the potential contribution of Velcade to her CHF, I'll hold her Velcade for now. Her repeated echo in February 2018 showed improved EF 35%. -Significant cardiotoxicity including CHF has not been reported in [place], I'll continue every 2 weeks, along with dexamethasone 20 mg every other week -lab reviewed, mild anemia, stable, will proceed dara today -She hasn't had Velcade since December 2017. Will continue holding it for now due to her CHF  -she has Dara 4 more treatments every 2 weeks, then change to monthly treatments. She has a trip from [place]12[place]. Do treatment a day early due to this.   2. Pancytopenia  -secondary to chemo  -Mild leukopenia, no neutropenia, mild anemia stable -Continue monitoring closely  3. CHF, CAD, inferior STEMI in  02/2014 -continue metoprolol and lipitor, Dr. Meda Coffee started her on lisinopril also -follow up with Dr. Meda Coffee  -her previous echo showed EF 25-30%, significantly lower than before. -Reviewed the echo with the patient.   4. Osteoporosis -previous bone density scan showed worsening osteoporosis, continue zometa, changed to every 3 months after 03/2015.  -she declined repeating DEXA  -continue Zometa every 3 months, next due in early April    5. Mild productive cough -Possibly related to her CHF, she had allergy reaction to Levaquin, completed a course of doxycycline. No significant improvement. -Still present, but improving.   Plan -will proceed with Daratumumab today and continue every 2 weeks, she takes dexa the day before infusion,  hold Velcade -lab, f/u and Dara on 3/29 and 4/11 -Zometa infusion on April 11   All questions were answered. The patient knows to call the clinic with any problems, questions or concerns. We can certainly see the patient much sooner if necessary.  I spent 20 minutes counseling the patient face to face. The total time spent in the appointment was 25 minutes.  This document serves as a record of services personally performed by [name], MD. It was created on her behalf by Martinique Casey, a trained medical scribe. The creation of this record is based on the scribe's personal observations and the provider's statements to them. This document has been checked and approved by the attending provider.  I have reviewed the above documentation for accuracy and completeness and I agree with the above.   [name]  09/27/2016

## 2016-09-27 ENCOUNTER — Encounter: Payer: Self-pay | Admitting: Hematology

## 2016-09-27 ENCOUNTER — Ambulatory Visit (HOSPITAL_BASED_OUTPATIENT_CLINIC_OR_DEPARTMENT_OTHER): Payer: Medicare Other | Admitting: Hematology

## 2016-09-27 ENCOUNTER — Ambulatory Visit (HOSPITAL_BASED_OUTPATIENT_CLINIC_OR_DEPARTMENT_OTHER): Payer: Medicare Other

## 2016-09-27 ENCOUNTER — Other Ambulatory Visit (HOSPITAL_BASED_OUTPATIENT_CLINIC_OR_DEPARTMENT_OTHER): Payer: Medicare Other

## 2016-09-27 VITALS — BP 125/62 | HR 71 | Temp 97.9°F | Resp 18

## 2016-09-27 VITALS — BP 116/77 | HR 77 | Temp 97.7°F | Resp 18 | Ht 66.0 in | Wt 128.8 lb

## 2016-09-27 DIAGNOSIS — I5022 Chronic systolic (congestive) heart failure: Secondary | ICD-10-CM

## 2016-09-27 DIAGNOSIS — M818 Other osteoporosis without current pathological fracture: Secondary | ICD-10-CM

## 2016-09-27 DIAGNOSIS — M810 Age-related osteoporosis without current pathological fracture: Secondary | ICD-10-CM

## 2016-09-27 DIAGNOSIS — C9000 Multiple myeloma not having achieved remission: Secondary | ICD-10-CM | POA: Diagnosis not present

## 2016-09-27 DIAGNOSIS — C9002 Multiple myeloma in relapse: Secondary | ICD-10-CM

## 2016-09-27 DIAGNOSIS — D61810 Antineoplastic chemotherapy induced pancytopenia: Secondary | ICD-10-CM

## 2016-09-27 DIAGNOSIS — Z5112 Encounter for antineoplastic immunotherapy: Secondary | ICD-10-CM

## 2016-09-27 DIAGNOSIS — D61818 Other pancytopenia: Secondary | ICD-10-CM

## 2016-09-27 DIAGNOSIS — I252 Old myocardial infarction: Secondary | ICD-10-CM | POA: Diagnosis not present

## 2016-09-27 LAB — CBC WITH DIFFERENTIAL/PLATELET
BASO%: 0.4 % (ref 0.0–2.0)
Basophils Absolute: 0 10*3/uL (ref 0.0–0.1)
EOS%: 0.1 % (ref 0.0–7.0)
Eosinophils Absolute: 0 10*3/uL (ref 0.0–0.5)
HCT: 29.1 % — ABNORMAL LOW (ref 34.8–46.6)
HGB: 9.9 g/dL — ABNORMAL LOW (ref 11.6–15.9)
LYMPH#: 0.5 10*3/uL — AB (ref 0.9–3.3)
LYMPH%: 10.6 % — AB (ref 14.0–49.7)
MCH: 37.2 pg — ABNORMAL HIGH (ref 25.1–34.0)
MCHC: 34 g/dL (ref 31.5–36.0)
MCV: 109.2 fL — ABNORMAL HIGH (ref 79.5–101.0)
MONO#: 0.3 10*3/uL (ref 0.1–0.9)
MONO%: 7.3 % (ref 0.0–14.0)
NEUT%: 81.6 % — AB (ref 38.4–76.8)
NEUTROS ABS: 3.7 10*3/uL (ref 1.5–6.5)
PLATELETS: 152 10*3/uL (ref 145–400)
RBC: 2.66 10*6/uL — AB (ref 3.70–5.45)
RDW: 15.9 % — ABNORMAL HIGH (ref 11.2–14.5)
WBC: 4.6 10*3/uL (ref 3.9–10.3)

## 2016-09-27 LAB — COMPREHENSIVE METABOLIC PANEL
ALT: 17 U/L (ref 0–55)
ANION GAP: 13 meq/L — AB (ref 3–11)
AST: 21 U/L (ref 5–34)
Albumin: 4.3 g/dL (ref 3.5–5.0)
Alkaline Phosphatase: 53 U/L (ref 40–150)
BILIRUBIN TOTAL: 0.77 mg/dL (ref 0.20–1.20)
BUN: 21.4 mg/dL (ref 7.0–26.0)
CHLORIDE: 107 meq/L (ref 98–109)
CO2: 20 meq/L — AB (ref 22–29)
CREATININE: 0.8 mg/dL (ref 0.6–1.1)
Calcium: 9.6 mg/dL (ref 8.4–10.4)
EGFR: 69 mL/min/{1.73_m2} — ABNORMAL LOW (ref >90)
Glucose: 94 mg/dl (ref 70–140)
Potassium: 3.6 mEq/L (ref 3.5–5.1)
Sodium: 140 mEq/L (ref 136–145)
TOTAL PROTEIN: 6.5 g/dL (ref 6.4–8.3)

## 2016-09-27 MED ORDER — PROCHLORPERAZINE MALEATE 10 MG PO TABS
10.0000 mg | ORAL_TABLET | Freq: Once | ORAL | Status: AC
  Administered 2016-09-27: 10 mg via ORAL

## 2016-09-27 MED ORDER — MONTELUKAST SODIUM 10 MG PO TABS
ORAL_TABLET | ORAL | Status: AC
  Filled 2016-09-27: qty 1

## 2016-09-27 MED ORDER — METHYLPREDNISOLONE SODIUM SUCC 125 MG IJ SOLR
INTRAMUSCULAR | Status: AC
Start: 2016-09-27 — End: 2016-09-27
  Filled 2016-09-27: qty 2

## 2016-09-27 MED ORDER — DIPHENHYDRAMINE HCL 25 MG PO CAPS
50.0000 mg | ORAL_CAPSULE | Freq: Once | ORAL | Status: AC
  Administered 2016-09-27: 50 mg via ORAL

## 2016-09-27 MED ORDER — DIPHENHYDRAMINE HCL 25 MG PO CAPS
ORAL_CAPSULE | ORAL | Status: AC
  Filled 2016-09-27: qty 2

## 2016-09-27 MED ORDER — PROCHLORPERAZINE MALEATE 10 MG PO TABS
ORAL_TABLET | ORAL | Status: AC
  Filled 2016-09-27: qty 1

## 2016-09-27 MED ORDER — ACETAMINOPHEN 325 MG PO TABS
650.0000 mg | ORAL_TABLET | Freq: Once | ORAL | Status: AC
  Administered 2016-09-27: 650 mg via ORAL

## 2016-09-27 MED ORDER — SODIUM CHLORIDE 0.9 % IV SOLN
16.0000 mg/kg | Freq: Once | INTRAVENOUS | Status: AC
  Administered 2016-09-27: 900 mg via INTRAVENOUS
  Filled 2016-09-27: qty 5

## 2016-09-27 MED ORDER — SODIUM CHLORIDE 0.9 % IV SOLN
Freq: Once | INTRAVENOUS | Status: AC
  Administered 2016-09-27: 10:00:00 via INTRAVENOUS

## 2016-09-27 MED ORDER — ACETAMINOPHEN 325 MG PO TABS
ORAL_TABLET | ORAL | Status: AC
  Filled 2016-09-27: qty 2

## 2016-09-27 MED ORDER — MONTELUKAST SODIUM 10 MG PO TABS
10.0000 mg | ORAL_TABLET | Freq: Once | ORAL | Status: AC
  Administered 2016-09-27: 10 mg via ORAL

## 2016-09-27 MED ORDER — METHYLPREDNISOLONE SODIUM SUCC 125 MG IJ SOLR
125.0000 mg | Freq: Once | INTRAMUSCULAR | Status: AC
  Administered 2016-09-27: 125 mg via INTRAVENOUS

## 2016-09-27 NOTE — Patient Instructions (Signed)
[facility] Discharge Instructions for Patients Receiving Chemotherapy  Today you received the following chemotherapy agents Darzalex.  To help prevent nausea and vomiting after your treatment, we encourage you to take your nausea medication as directed.  If you develop nausea and vomiting that is not controlled by your nausea medication, call the clinic.   BELOW ARE SYMPTOMS THAT SHOULD BE REPORTED IMMEDIATELY:  *FEVER GREATER THAN 100.5 F  *CHILLS WITH OR WITHOUT FEVER  NAUSEA AND VOMITING THAT IS NOT CONTROLLED WITH YOUR NAUSEA MEDICATION  *UNUSUAL SHORTNESS OF BREATH  *UNUSUAL BRUISING OR BLEEDING  TENDERNESS IN MOUTH AND THROAT WITH OR WITHOUT PRESENCE OF ULCERS  *URINARY PROBLEMS  *BOWEL PROBLEMS  UNUSUAL RASH Items with * indicate a potential emergency and should be followed up as soon as possible.  Feel free to call the clinic you have any questions or concerns. The clinic phone number is [phone].  Please show the CHEMO ALERT CARD at check-in to the Emergency Department and triage nurse.    

## 2016-09-30 LAB — KAPPA/LAMBDA LIGHT CHAINS
IG KAPPA FREE LIGHT CHAIN: 5.2 mg/L (ref 3.3–19.4)
Ig Lambda Free Light Chain: 2.4 mg/L — ABNORMAL LOW (ref 5.7–26.3)
Kappa/Lambda FluidC Ratio: 2.17 — ABNORMAL HIGH (ref 0.26–1.65)

## 2016-10-02 LAB — MULTIPLE MYELOMA PANEL, SERUM
Albumin SerPl Elph-Mcnc: 4.2 g/dL (ref 2.9–4.4)
Albumin/Glob SerPl: 2.1 — ABNORMAL HIGH (ref 0.7–1.7)
Alpha 1: 0.2 g/dL (ref 0.0–0.4)
Alpha2 Glob SerPl Elph-Mcnc: 0.6 g/dL (ref 0.4–1.0)
B-Globulin SerPl Elph-Mcnc: 0.9 g/dL (ref 0.7–1.3)
Gamma Glob SerPl Elph-Mcnc: 0.4 g/dL (ref 0.4–1.8)
Globulin, Total: 2.1 g/dL — ABNORMAL LOW (ref 2.2–3.9)
IGG (IMMUNOGLOBIN G), SERUM: 438 mg/dL — AB (ref 700–1600)
IgA, Qn, Serum: 6 mg/dL — ABNORMAL LOW (ref 64–422)
IgM, Qn, Serum: 11 mg/dL — ABNORMAL LOW (ref 26–217)
M PROTEIN SERPL ELPH-MCNC: 0.1 g/dL — AB
Total Protein: 6.3 g/dL (ref 6.0–8.5)

## 2016-10-07 ENCOUNTER — Telehealth: Payer: Self-pay | Admitting: Hematology

## 2016-10-07 ENCOUNTER — Other Ambulatory Visit: Payer: Self-pay | Admitting: Cardiovascular Disease

## 2016-10-07 NOTE — Telephone Encounter (Signed)
Pt called to change 4/123 appt to 4/11. Pt will look on MyChart for new appt times

## 2016-10-09 NOTE — Telephone Encounter (Signed)
Patient does not have a recent lipid panel in epic. Please advise on refill request. Thanks, MI

## 2016-10-10 NOTE — Telephone Encounter (Signed)
Please refill.

## 2016-10-11 ENCOUNTER — Ambulatory Visit (HOSPITAL_BASED_OUTPATIENT_CLINIC_OR_DEPARTMENT_OTHER): Payer: Medicare Other | Admitting: Nurse Practitioner

## 2016-10-11 ENCOUNTER — Ambulatory Visit (HOSPITAL_BASED_OUTPATIENT_CLINIC_OR_DEPARTMENT_OTHER): Payer: Medicare Other

## 2016-10-11 ENCOUNTER — Encounter: Payer: Self-pay | Admitting: General Practice

## 2016-10-11 ENCOUNTER — Ambulatory Visit: Payer: Medicare Other

## 2016-10-11 VITALS — BP 98/54 | HR 70 | Temp 97.5°F | Resp 17

## 2016-10-11 VITALS — BP 117/86 | HR 82 | Temp 98.4°F | Resp 18 | Ht 66.0 in | Wt 125.1 lb

## 2016-10-11 DIAGNOSIS — D61810 Antineoplastic chemotherapy induced pancytopenia: Secondary | ICD-10-CM

## 2016-10-11 DIAGNOSIS — C9002 Multiple myeloma in relapse: Secondary | ICD-10-CM

## 2016-10-11 DIAGNOSIS — Z5112 Encounter for antineoplastic immunotherapy: Secondary | ICD-10-CM

## 2016-10-11 DIAGNOSIS — C9000 Multiple myeloma not having achieved remission: Secondary | ICD-10-CM

## 2016-10-11 LAB — COMPREHENSIVE METABOLIC PANEL
ALT: 17 U/L (ref 0–55)
ANION GAP: 13 meq/L — AB (ref 3–11)
AST: 17 U/L (ref 5–34)
Albumin: 4.2 g/dL (ref 3.5–5.0)
Alkaline Phosphatase: 53 U/L (ref 40–150)
BILIRUBIN TOTAL: 0.75 mg/dL (ref 0.20–1.20)
BUN: 25.5 mg/dL (ref 7.0–26.0)
CHLORIDE: 106 meq/L (ref 98–109)
CO2: 21 mEq/L — ABNORMAL LOW (ref 22–29)
Calcium: 9.4 mg/dL (ref 8.4–10.4)
Creatinine: 0.9 mg/dL (ref 0.6–1.1)
EGFR: 65 mL/min/{1.73_m2} — ABNORMAL LOW (ref >90)
GLUCOSE: 90 mg/dL (ref 70–140)
POTASSIUM: 3.8 meq/L (ref 3.5–5.1)
SODIUM: 140 meq/L (ref 136–145)
TOTAL PROTEIN: 6.3 g/dL — AB (ref 6.4–8.3)

## 2016-10-11 LAB — CBC WITH DIFFERENTIAL/PLATELET
BASO%: 0.3 % (ref 0.0–2.0)
Basophils Absolute: 0 10*3/uL (ref 0.0–0.1)
EOS ABS: 0 10*3/uL (ref 0.0–0.5)
EOS%: 0.1 % (ref 0.0–7.0)
HCT: 31.3 % — ABNORMAL LOW (ref 34.8–46.6)
HGB: 11 g/dL — ABNORMAL LOW (ref 11.6–15.9)
LYMPH%: 8 % — AB (ref 14.0–49.7)
MCH: 38.5 pg — AB (ref 25.1–34.0)
MCHC: 35.1 g/dL (ref 31.5–36.0)
MCV: 109.5 fL — AB (ref 79.5–101.0)
MONO#: 0.5 10*3/uL (ref 0.1–0.9)
MONO%: 6.8 % (ref 0.0–14.0)
NEUT%: 84.8 % — AB (ref 38.4–76.8)
NEUTROS ABS: 5.8 10*3/uL (ref 1.5–6.5)
PLATELETS: 139 10*3/uL — AB (ref 145–400)
RBC: 2.86 10*6/uL — ABNORMAL LOW (ref 3.70–5.45)
RDW: 15.3 % — ABNORMAL HIGH (ref 11.2–14.5)
WBC: 6.9 10*3/uL (ref 3.9–10.3)
lymph#: 0.5 10*3/uL — ABNORMAL LOW (ref 0.9–3.3)

## 2016-10-11 MED ORDER — MONTELUKAST SODIUM 10 MG PO TABS
10.0000 mg | ORAL_TABLET | Freq: Once | ORAL | Status: AC
  Administered 2016-10-11: 10 mg via ORAL

## 2016-10-11 MED ORDER — MONTELUKAST SODIUM 10 MG PO TABS
ORAL_TABLET | ORAL | Status: AC
  Filled 2016-10-11: qty 1

## 2016-10-11 MED ORDER — DIPHENHYDRAMINE HCL 25 MG PO CAPS
50.0000 mg | ORAL_CAPSULE | Freq: Once | ORAL | Status: AC
  Administered 2016-10-11: 50 mg via ORAL

## 2016-10-11 MED ORDER — ACETAMINOPHEN 325 MG PO TABS
650.0000 mg | ORAL_TABLET | Freq: Once | ORAL | Status: AC
  Administered 2016-10-11: 650 mg via ORAL

## 2016-10-11 MED ORDER — ACETAMINOPHEN 325 MG PO TABS
ORAL_TABLET | ORAL | Status: AC
  Filled 2016-10-11: qty 2

## 2016-10-11 MED ORDER — METHYLPREDNISOLONE SODIUM SUCC 125 MG IJ SOLR
125.0000 mg | Freq: Once | INTRAMUSCULAR | Status: AC
  Administered 2016-10-11: 125 mg via INTRAVENOUS

## 2016-10-11 MED ORDER — PROCHLORPERAZINE MALEATE 10 MG PO TABS
ORAL_TABLET | ORAL | Status: AC
  Filled 2016-10-11: qty 1

## 2016-10-11 MED ORDER — SODIUM CHLORIDE 0.9 % IV SOLN
Freq: Once | INTRAVENOUS | Status: AC
  Administered 2016-10-11: 11:00:00 via INTRAVENOUS

## 2016-10-11 MED ORDER — PROCHLORPERAZINE MALEATE 10 MG PO TABS
10.0000 mg | ORAL_TABLET | Freq: Once | ORAL | Status: AC
  Administered 2016-10-11: 10 mg via ORAL

## 2016-10-11 MED ORDER — DIPHENHYDRAMINE HCL 25 MG PO CAPS
ORAL_CAPSULE | ORAL | Status: AC
  Filled 2016-10-11: qty 2

## 2016-10-11 MED ORDER — METHYLPREDNISOLONE SODIUM SUCC 125 MG IJ SOLR
INTRAMUSCULAR | Status: AC
  Filled 2016-10-11: qty 2

## 2016-10-11 MED ORDER — SODIUM CHLORIDE 0.9 % IV SOLN
16.0000 mg/kg | Freq: Once | INTRAVENOUS | Status: AC
  Administered 2016-10-11: 900 mg via INTRAVENOUS
  Filled 2016-10-11: qty 40

## 2016-10-11 NOTE — Patient Instructions (Signed)
[facility] Discharge Instructions for Patients Receiving Chemotherapy  Today you received the following chemotherapy agents Darzalex.  To help prevent nausea and vomiting after your treatment, we encourage you to take your nausea medication as directed.  If you develop nausea and vomiting that is not controlled by your nausea medication, call the clinic.   BELOW ARE SYMPTOMS THAT SHOULD BE REPORTED IMMEDIATELY:  *FEVER GREATER THAN 100.5 F  *CHILLS WITH OR WITHOUT FEVER  NAUSEA AND VOMITING THAT IS NOT CONTROLLED WITH YOUR NAUSEA MEDICATION  *UNUSUAL SHORTNESS OF BREATH  *UNUSUAL BRUISING OR BLEEDING  TENDERNESS IN MOUTH AND THROAT WITH OR WITHOUT PRESENCE OF ULCERS  *URINARY PROBLEMS  *BOWEL PROBLEMS  UNUSUAL RASH Items with * indicate a potential emergency and should be followed up as soon as possible.  Feel free to call the clinic you have any questions or concerns. The clinic phone number is [phone].  Please show the CHEMO ALERT CARD at check-in to the Emergency Department and triage nurse.    

## 2016-10-11 NOTE — Progress Notes (Signed)
Shannon Rush Spiritual Care Note  Followed up with Shannon Rush in infusion, providing pastoral presence, empathic listening, and massage therapy packet (including her two free gift certificates).  She used opportunity well to share and process about dear family relationships and also the anticipatory grief related to her son-in-law's cancer (age 75, in hospice care in [place]). In addition to family, sources of meaning and purpose include reading, knitting, gardening, and neighborhood/community engagement.  Shannon Rush is aware of ongoing Support Team availability, but please also page if immediate needs arise.  Thank you.   [place], [place], [facility] Pager [phone] Voicemail [phone]

## 2016-10-11 NOTE — Progress Notes (Signed)
  Shannon Rush OFFICE PROGRESS NOTE   Diagnosis:  Multiple myeloma  INTERVAL HISTORY:   Shannon Rush returns as scheduled. She completed another cycle of Daratumumab on 09/27/2016. She overall feels well. She denies any problems with the infusion. No signs of a reaction. No nausea or vomiting. No mouth sores. No diarrhea. No rash. She describes her appetite is "okay". She reports her weight is stable. When asked about pain she states she has no "serious" pain.  Objective:  Vital signs in last 24 hours:  Blood pressure 117/86, pulse 82, temperature 98.4 F (36.9 C), temperature source Oral, resp. rate 18, height '5\' 6"'$  (1.676 m), weight 125 lb 1.6 oz (56.7 kg), SpO2 97 %.    HEENT: No thrush or ulcers. Resp: Lungs clear bilaterally. Cardio: Regular rate and rhythm. GI: Abdomen soft and nontender. No hepatomegaly. No splenomegaly. Vascular: No leg edema. Calves soft and nontender. Neuro: Alert and oriented.  Skin: No rash.    Lab Results:  Lab Results  Component Value Date   WBC 6.9 10/11/2016   HGB 11.0 (L) 10/11/2016   HCT 31.3 (L) 10/11/2016   MCV 109.5 (H) 10/11/2016   PLT 139 (L) 10/11/2016   NEUTROABS 5.8 10/11/2016    Imaging:  No results found.  Medications: I have reviewed the patient's current medications.  Assessment/Plan: 1. IgG kappa Multiple myeloma s/p Auto SCT, intermediate risk, relapsed in 01/2016; currently being treated with Daratumumab every 2 weeks.  2. Pancytopenia secondary to chemotherapy. She has stable mild anemia and thrombocytopenia. White count is normal. 3. CHF, CAD, inferior STEMI August 2015.  4. Osteoporosis. She continues Zometa every 3 months.   Disposition: Shannon Rush appears stable. Plan to proceed with Daratumumab today as scheduled. She will return for a follow-up visit and the next Daratumumab on 10/23/2016. She will contact the office in the interim with any problems.    Ned Card ANP/GNP-BC   10/11/2016  9:47  AM

## 2016-10-18 ENCOUNTER — Other Ambulatory Visit: Payer: Self-pay | Admitting: Cardiovascular Disease

## 2016-10-22 NOTE — Progress Notes (Signed)
[name], MD Shannon Rush [id number]  DIAGNOSIS: Multiple myeloma with relapse  CHIEF COMPLAINS: Follow-up of multiple myeloma  CURRENT TREATMENT:   1. Pomalidomide 4 mg daily on day 1-21, dexamethasone 40 mg on day 1, 8 and 15, every 28 days, started on 02/27/2016, daratumumab weekly started on 03/07/2016, treatment on hold since 03/09/2016 due to hospitalization and severe cytopenia 2. Daratumumab restarted on 04/26/2016, Velcade 1.28m/m2 and dexa 219mweekly start on 11/3, velcade held since 06/28/2016 due to CHF  2. Zometa 3.5 mg monthly started on 06/2013, on every 3 months now, last 07/25/2016  Oncology History   [facility]  Staging form: Multiple Myeloma, AJCC 6th Edition   - Clinical: Stage IIIA - Signed by [name] on 09/04/2013      [facility] ([name]  05/26/2012 Initial Diagnosis    Presenting IgG was 4,040 mg/dL on 06/05/2012 (IgA 35; IgM 34); kappa free light chain 34.4 mg/dL, lambda 0.00, kappa:lambda ratio of 34.75; SPEP with M-spike of 2.60.      06/30/2012 Imaging    Numerous lytic lesions throughout the calvarium.  Lytic lesion within the left lateral scapula.  Findings most compatible with metastases or myeloma. Multiple mid and lower thoracic compression fractures.  Slight compression through the endplates at L3.      [id number]/50/[id number] Marrow Biopsy    Bone marrow biopsy showed 49% plasma cell. Normal classical cytogenetics; however, myeloma FISH panel showed 13q- (intermediate risk)          07/14/2012 - 07/27/2012 Radiation Therapy    Palliative radiation 20 Gy over 10 fractions between to thoracic spine cord compression and symptomatic left scapula lytic lesion.      08/10/2012 -  Chemotherapy    Started SQ Velcade once weekly, 3 weeks on, 1 weeks off; daily Revlimid d1-21, 7 days off; and Dexamethasone 4055mO weekly.       02/12/2013 Bone  Marrow Transplant    [facility]     06/14/2013 Tumor Marker    IgG  1830,  Kappa:lamba ratio, 1.69 (baseline was 34.75 or   M-spike 0.28 (baseline of 2.6 or  89.3% of baseline).        06/25/2013 -  Chemotherapy    Started zometa 3.5 mg monthly       09/01/2013 -  Chemotherapy    Maintenance therapy with revlimid 5mg33mily for 14 days and then off for 7 (decreased from 21 days on and 7 days off based on neutropenia).       09/13/2013 - 09/17/2014 Hospital Admission    Hospital admission for pneumonia      12/20/2013 Treatment Plan Change    Maintenance therapy decreased to 2.5 mg daily for 21 days and then off for 7 days based on low counts.       01/25/2014 Tumor Marker    IgG 1360 M spike 0.5      03/01/2014 - 03/05/2014 [facility] Ad[facility], received thrombolytic therapy, cath showed 60% prox RCA, mid-distal RCA 50%, Echo normal, no stents.      04/11/2014 - 08/07/2014 Chemotherapy    Continue Revlimid at 2.5 mg 3 weeks on 1 week off      08/08/2014 - 08/13/2014 Hospital Admission    Hospital admission for pneumonia. Her Revlimid was held.  08/29/2014 - 09/14/2014 Chemotherapy    Maintenance Revlimid restarted      09/14/2014 - 09/17/2014 Hospital Admission    Admitted for pneumonia after coming back from a trip in [name]. Revlimid was held again.      11/13/2014 - 12/25/2015 Chemotherapy    Maintenance Revlimid restarted, changed to 2.67m daily, 2 weks on, 1 week off from 12/15/2014, stopped due to disease progression       01/24/2016 Progression    Patient is M protein has gradually increased to 1.0g, repeated a bone marrow biopsy showed plasma cell 10-30%      02/27/2016 -  Chemotherapy    Pomalidomide 4 mg daily on day 1-21, dexamethasone 40 mg on day 1, 8 and 15, every 28 days, started on 02/27/2016, daratumumab weekly started on 03/07/2016, held after first dose, due to  hospitalization and a severe pancytopenia      03/10/2016 - 03/29/2016 Hospital Admission    Pt was admitted for sepsis from pneumonia, and severe pancytopenia. She required ICU stay for a few days due to hypotension, developed b/l pleural effusion required diuretics and b/l thoracentesis. She also required blood and plt transfusion, and prolonged neupogen injection for severe neutropenia. She was discharged home after 3 weeks hospital stay       04/26/2016 -  Chemotherapy    Daratumumab restarted on 04/26/2016, Velcade 1.320mm2 and dexa 2064meekly start on 11/3, velcade held since 06/28/2016 due to CHF       08/26/2016 Echocardiogram    - Left ventricle: LVEF is approximately 35% with diffuse diffuse   hypokinesis with severe hypokinesis/ akinesis of the   inferior/inferoseptal walls. In comparison to echo images from   December 2017, there does not appear to be significant change The   cavity size was normal. Wall thickness was normal. Doppler   parameters are consistent with abnormal left ventricular   relaxation (grade 1 diastolic dysfunction). - Aortic valve: AV is thickened. 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 Present in echo from December 2017 There was   trivial regurgitation. - Right ventricle: Systolic function was mildly reduced. - Right atrium: The atrium was mildly dilated. - Pericardium, extracardiac: A trivial pericardial effusion was   identified.      INTERVAL HISTORY: Shannon Rush 36o. female with a past medical history of MM as detailed above, s/p SCT is here for follow-up. The patient's son-in-law passed away on Easter from bladder cancer. She is mourning his loss at this time. [place] She will be back the 30th of April. The patient denies fever or chills. She has a cough in the monrings. She forgot to take her Dexamethasone  yesterday.  MEDICAL HISTORY: Past Medical History:  Diagnosis Date  . CAD (coronary artery disease)    a. [place] [facility] >>> [place] [facility] pRCA 60%, mid to dist RCA 50% >>> med rx  . Cord compression [place]   MRI- diffuse myeloma involvement of T-L spine  . History of radiation therapy 07/13/12-07/27/12   spinal cord compression T3-T10,left scapula  . Hx of echocardiogram    [facility] Normal LVEF without significant regional wall motion abnormalities. Mild   . [place] [place]  . OSTEOPOROSIS 06/11/2010   Multiple compression fractures; and spontaneous fracture of sternum Qualifier: Diagnosis  of  By: [name], [name]     . Thoracic kyphosis 07/13/12   per MRI scan  . Unspecified deficiency anemia      ALLERGIES:  is allergic to zosyn [piperacillin sod-tazobactam so]; levaquin [levofloxacin in d5w]; other; piperacillin-tazobactam in dex; zithromax [azithromycin]; and ciprofloxacin.  MEDICATIONS: has a current medication list which includes the following prescription(s): acetaminophen, acyclovir, aspirin ec, atorvastatin, calcium carbonate, dexamethasone, doxycycline, fluticasone, lisinopril, metoprolol succinate, nitroglycerin, ondansetron, proair hfa, and prochlorperazine, and the following Facility-Administered Medications: sodium chloride, sodium chloride, acetaminophen, acetaminophen, furosemide, furosemide, heparin lock flush, heparin lock flush, heparin lock flush, heparin lock flush, sodium chloride flush, sodium chloride flush, sodium chloride flush, sodium chloride flush, and sodium chloride flush.  SURGICAL HISTORY:  Past Surgical History:  Procedure Laterality Date  . APPENDECTOMY    . CESAREAN SECTION     x2   . COLONOSCOPY  2007   neg with Dr. [name]  . ELBOW SURGERY    . HIP SURGERY  2009   left  . TUBAL LIGATION      REVIEW OF SYSTEMS:    Constitutional: Denies abnormal weight loss Eyes: Denies blurriness of vision Ears, nose, mouth, throat, and face: Denies mucositis or sore throat,  Respiratory: Denies dyspnea or wheezes Cardiovascular: Denies palpitation, chest discomfort  Gastrointestinal:  Denies nausea, heartburn or change in bowel habits Skin: Denies abnormal skin rashes Lymphatics: Denies new lymphadenopathy or easy bruising Neurological:Denies numbness, tingling or new weaknesses Behavioral/Psych: Mood is stable, no new changes (+) anxiety over husband's health and passing of son All other systems were reviewed with the patient and are negative.   PHYSICAL EXAMINATION: ECOG PERFORMANCE STATUS: 1  BP (!) 114/59 (BP Location: Right Arm, Patient Position: Sitting)   Pulse 77   Temp 97.7 F (36.5 C) (Oral)   Resp 18   Ht _0  (1.676 m)   Wt 125 lb 11.2 oz (57 kg)   SpO2 98%   BMI 20.29 kg/m   GENERAL:alert, no distress and comfortable; easily mobile to exam table; kyphosis, moderate.  SKIN: skin color, texture, turgor are normal, no rashes or significant lesions except a small healing, dry wound in the right lower leg above his ankle, with surrounding skin erythema and swelling EYES: normal, Conjunctiva are pink, 1 tear/injectionof blood vessel left eye in sclera. OROPHARYNX:no exudate, no erythema and lips, buccal mucosa, and tongue normal, no ONJ NECK: supple, thyroid normal size, non-tender, without nodularity; nodules cervical bilaterally.  LYMPH:  no palpable lymphadenopathy in the cervical, axillary or supraclavicular LUNGS: crackles at the bases bilaterally with normal breathing effort, no wheezes or rhonchi HEART: regular rate & rhythm and no murmurs  ABDOMEN:abdomen soft, non-tender and normal bowel sounds Musculoskeletal:no cyanosis of digits and no clubbing, (+) spine scoliosis  NEURO: alert & oriented x 3 with fluent speech, no focal motor/sensory deficits   Labs:  CBC Latest Ref Rng & Units  10/23/2016 10/11/2016 09/27/2016  WBC 3.9 - 10.3 10e3/uL 3.5(L) 6.9 4.6  Hemoglobin 11.6 - 15.9 g/dL 10.7(L) 11.0(L) 9.9(L)  Hematocrit 34.8 - 46.6 % 30.9(L) 31.3(L) 29.1(L)  Platelets 145 - 400 10e3/uL 150 139(L) 152    CMP Latest Ref Rng & Units 10/23/2016 10/11/2016 09/27/2016  Glucose 70 - 140 mg/dl 110 90 94  BUN 7.0 - 26.0 mg/dL 18.8 25.5 21.4  Creatinine 0.6 - 1.1 mg/dL 0.8 0.9 0.8  Sodium 136 - 145 mEq/L 142 140 140  Potassium 3.5 - 5.1 mEq/L 3.8 3.8 3.6  Chloride 101 - 111 mmol/L - - -  CO2 22 - 29 mEq/L 24 21(L) 20(L)  Calcium 8.4 - 10.4 mg/dL 9.3 9.4 9.6  Total Protein 6.4 - 8.3 g/dL 5.6(L) 6.3(L) 6.5  Total Bilirubin 0.20 - 1.20 mg/dL 0.89 0.75 0.77  Alkaline Phos 40 - 150 U/L 45 53 53  AST 5 - 34 U/L _0 ALT 0 - 55 U/L _1 SPEP M-PROTEIN 10/01/2013: 0.18 (nadir)  10/13/2014: 0.6 11/21/2014: 0.5 01/30/2015: 0.6 03/13/2015: 0.6 04/20/2015: 0.5 06/01/2015: 0.5 07/20/2015: 0.7 09/14/2015: 0.7 10/26/2015: 0.8 12/07/2015: 1.0 02/07/2016: 1.1  04/03/2016: 0.2 05/03/2016: 0.2  05/31/2016: 0.1 06/28/2016: 0.1 07/25/2016: 0.1 08/21/2016: 0.1 09/27/2016: 0.1  IgG ([phone]) 10/13/2014: 1710 11/21/2014: 1460 01/30/2015: 1380 03/13/15: 1570 04/20/2015: 1550 06/01/2015: 1590 07/22/2015: 1770 09/14/2015: 1787 10/26/2015: 1898 12/07/2015: 2045 02/07/2016: 1883 04/03/2016: 787 05/03/2016: 685  05/31/2016: 544 06/28/2016: 463 07/25/2016: 455 08/21/2016: 426 09/27/2016: 438  Serum kappa, lambda light chains, and ratio  11/21/2014: 3.95, 2.08, 1.90  01/30/2015: 3.92, 2.64, 1.48 03/13/2015: 3.55, 2.17, 1.64 04/20/15: 4.52, 1.78, 2.54 06/01/2015: 4.82, 1.75, 2.75 07/20/2015: 8.71, 2.43, 3.58 09/14/2015: 9.17, 2.02, 4.55 10/26/2015: 11.4, 1.75, 6.42 12/07/2015: 1.54, 1.83, 8.44 02/07/2016: 1.93, 1.42, 13.6 04/03/2016: 0.96, 0.95, 1.01  05/03/2016: 0.71, 0.34, 2.09  05/31/2016: 0.48, 0.26, 1.85 06/28/2016: 0.52, 0.60, 0.87 07/25/2016: 0.46, 0.27, 1.70 08/21/2016: 0.43, 0.27, 1.59 09/27/2016:  0.52, 0.24, 2.17  24 hr urine PEP (total protein, M-spike) 10/13/2014: (-) 02/12/2016: 224m, 695m9/22/2017: 114 mg, (-)  PATHOLOGY REPORT   Diagnosis 01/24/2016 Bone Marrow, Aspirate,Biopsy, and Clot, RT iliac and core - VARIABLY CELLULAR BONE MARROW WITH PLASMA CELL NEOPLASM. - SEE COMMENT. PERIPHERAL BLOOD: - MACROCYTIC ANEMIA. Diagnosis Note The bone marrow is variably cellular with trilineage hematopoiesis and non specific myeloid changes. In this background, the plasma cell component is variable ranging from less than 10 % in many areas to 30% in some aspirate particles with relatively low cellularity. Foci of interstitial infiltrates and small clusters are also seen in the clot section. Immunohistochemical stains show kappa light chain restriction in atypical plasma cell clusters and infiltrates consistent with plasma cell neoplasm. Correlation with cytogenetic and FISH studies is recommended. (BNS:gt, 7/29-Jan-2016 Diagnosis 03/28/16 PLEURAL FLUID, LEFT (SPECIMEN 1 OF 1 COLLECTED 03/28/16): REACTIVE MESOTHELIAL CELLS  RADIOGRAPHIC STUDIES:  Echo 08/26/2016 - Left ventricle: LVEF is approximately 35% with diffuse diffuse   hypokinesis with severe hypokinesis/ akinesis of the   inferior/inferoseptal walls. In comparison to echo images from   December 2017, there does not appear to be significant change The   cavity size was normal. Wall thickness was normal. Doppler   parameters are consistent with abnormal left ventricular   relaxation (grade 1 diastolic dysfunction). - Aortic valve: AV is thickened. 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 Present in echo from December 2017 There was   trivial regurgitation. - Right ventricle: Systolic function was mildly reduced. - Right atrium: The atrium was mildly dilated. - Pericardium, extracardiac: A trivial pericardial effusion was   identified.  ASSESSMENT: ClLINDALEE HUIZINGA[age].o. female with a history of Multiple Myeloma s/p autotransplant in August 2014 followed by maintenence low dose Revlimid therapy, and relapsed in 01/2016  PLAN:   1. IgG kappa Multiple myeloma s/p Auto SCT, intermediate risk, relapsed in 01/2016 --MM markers demonstrate a very good initial response (M protein baseline 2.6, down to 0.18 after induction chemo and transplant, stable around 0.5 since July 2015, but has gradually increased  lately),  she unfortunately relapsed in July 2017 -I previously discussed her repeated bone marrow biopsy from 02/01/2016, which showed 10-30% plasma cells in her marrow -I agree with Dr. Kendell Bane recommendation to stop revlimid and change to Daratumumab, Pomalidomide and dexamethasone due to her disease relapse. I don't think she has resistance to Revlimid, she has been on very small dose of Revlimid due to her prior history of multiple infections. -She started first cycle DaraPD in 02/2016, but in 2 weeks she developed severe pancytopenia, pneumonia with sepsis required 3 weeks hospital stay. She did have a very nice response to the treatment, her M-protein has significantly reduced from 1.2 to 0.2, light chain levels normalized even with half cycle treatment  -I recommended her to change regiment to DaraVD, and started her on dara every 2 weeks for 2 doses, she tolerated this very well -she has had good response to treatment, M-protein significantly dropped  -Her previous echo in December 2017 showed a significant drop on EF, she is not much symptomatically for CHF, due to the potential contribution of Velcade to her CHF, I'll hold her Velcade for now. Her repeated echo in February 2018 showed improved EF 35%. -Significant cardiotoxicity including CHF has not been reported in [place], I'll continue every 2 weeks, along with dexamethasone 20 mg every other week -lab reviewed, mild anemia, stable, will proceed with Dara today  -She hasn't had Velcade since December  2017. Will continue holding it for now due to her CHF  -she has Dara 2 more treatments every 2 weeks, then change to monthly treatments. She has a trip from [place]. Will postpone her next treatment to 3 weeks  -she forgot about taking dexa yesterday, due to her coming trip, I will skip her dexa this cycle, she will receive solu-medro as premeds   2. Pancytopenia  -secondary to chemo  -Mild leukopenia, no neutropenia, mild anemia stable -Continue monitoring closely  3. CHF, CAD, inferior STEMI in 02/2014 -continue metoprolol and lipitor, Dr. Meda Coffee started her on lisinopril also -follow up with Dr. Meda Coffee  -her previous echo on 08/26/16 showed improvement with EF 30-35% from 25-30%. -Reviewed the echo with the patient.   4. Osteoporosis -previous bone density scan showed worsening osteoporosis, continue zometa, changed to every 3 months after 03/2015.  -she declined repeating DEXA  -continue Zometa every 3 months, next due today    Plan -will proceed with Daratumumab today and continue every 2 weeks (patient on a trip from [place]12[place]4/30 so will postpone next cycle to 3 weeks), she takes dexa the day before infusion, hold Velcade. -The patient forgot to take Dex yesterday. She will skip dexa this cycle, but OK to take after her infusion if she has a reaction. -Zometa infusion on April 11 -I signed an approval form for her to have massages. -Lab, f/u, and Dara in 3 weeks.   All questions were answered. The patient knows to call the clinic with any problems, questions or concerns. We can certainly see the patient much sooner if necessary.  I spent 20 minutes counseling the patient face to face. The total time spent in the appointment was 25 minutes.   [name]  10/23/2016   This document serves as a record of services personally performed by [name], MD. It was created on her behalf by Darcus Austin, a trained medical scribe. The creation of this record is based on the scribe's personal  observations and the provider's statements to them. This document has been checked and  approved by the attending provider.

## 2016-10-23 ENCOUNTER — Ambulatory Visit (HOSPITAL_BASED_OUTPATIENT_CLINIC_OR_DEPARTMENT_OTHER): Payer: Medicare Other

## 2016-10-23 ENCOUNTER — Ambulatory Visit (HOSPITAL_BASED_OUTPATIENT_CLINIC_OR_DEPARTMENT_OTHER): Payer: Medicare Other | Admitting: Hematology

## 2016-10-23 ENCOUNTER — Other Ambulatory Visit (HOSPITAL_BASED_OUTPATIENT_CLINIC_OR_DEPARTMENT_OTHER): Payer: Medicare Other

## 2016-10-23 ENCOUNTER — Encounter: Payer: Self-pay | Admitting: Hematology

## 2016-10-23 VITALS — BP 114/59 | HR 77 | Temp 97.7°F | Resp 18 | Ht 66.0 in | Wt 125.7 lb

## 2016-10-23 VITALS — BP 103/46 | HR 70 | Temp 98.7°F | Resp 18

## 2016-10-23 DIAGNOSIS — T451X5A Adverse effect of antineoplastic and immunosuppressive drugs, initial encounter: Secondary | ICD-10-CM

## 2016-10-23 DIAGNOSIS — D61810 Antineoplastic chemotherapy induced pancytopenia: Secondary | ICD-10-CM

## 2016-10-23 DIAGNOSIS — I5022 Chronic systolic (congestive) heart failure: Secondary | ICD-10-CM | POA: Diagnosis not present

## 2016-10-23 DIAGNOSIS — D61818 Other pancytopenia: Secondary | ICD-10-CM

## 2016-10-23 DIAGNOSIS — M810 Age-related osteoporosis without current pathological fracture: Secondary | ICD-10-CM

## 2016-10-23 DIAGNOSIS — Z5112 Encounter for antineoplastic immunotherapy: Secondary | ICD-10-CM | POA: Diagnosis not present

## 2016-10-23 DIAGNOSIS — C9000 Multiple myeloma not having achieved remission: Secondary | ICD-10-CM | POA: Diagnosis not present

## 2016-10-23 DIAGNOSIS — C9002 Multiple myeloma in relapse: Secondary | ICD-10-CM

## 2016-10-23 DIAGNOSIS — I252 Old myocardial infarction: Secondary | ICD-10-CM | POA: Diagnosis not present

## 2016-10-23 DIAGNOSIS — M818 Other osteoporosis without current pathological fracture: Secondary | ICD-10-CM | POA: Diagnosis not present

## 2016-10-23 DIAGNOSIS — D6481 Anemia due to antineoplastic chemotherapy: Secondary | ICD-10-CM

## 2016-10-23 LAB — CBC WITH DIFFERENTIAL/PLATELET
BASO%: 0.5 % (ref 0.0–2.0)
BASOS ABS: 0 10*3/uL (ref 0.0–0.1)
EOS%: 0.9 % (ref 0.0–7.0)
Eosinophils Absolute: 0 10*3/uL (ref 0.0–0.5)
HEMATOCRIT: 30.9 % — AB (ref 34.8–46.6)
HGB: 10.7 g/dL — ABNORMAL LOW (ref 11.6–15.9)
LYMPH#: 0.4 10*3/uL — AB (ref 0.9–3.3)
LYMPH%: 10.3 % — AB (ref 14.0–49.7)
MCH: 37.1 pg — AB (ref 25.1–34.0)
MCHC: 34.7 g/dL (ref 31.5–36.0)
MCV: 106.8 fL — AB (ref 79.5–101.0)
MONO#: 0.3 10*3/uL (ref 0.1–0.9)
MONO%: 7.4 % (ref 0.0–14.0)
NEUT#: 2.9 10*3/uL (ref 1.5–6.5)
NEUT%: 80.9 % — AB (ref 38.4–76.8)
PLATELETS: 150 10*3/uL (ref 145–400)
RBC: 2.89 10*6/uL — AB (ref 3.70–5.45)
RDW: 15.3 % — ABNORMAL HIGH (ref 11.2–14.5)
WBC: 3.5 10*3/uL — ABNORMAL LOW (ref 3.9–10.3)

## 2016-10-23 LAB — COMPREHENSIVE METABOLIC PANEL
ALBUMIN: 3.8 g/dL (ref 3.5–5.0)
ALK PHOS: 45 U/L (ref 40–150)
ALT: 15 U/L (ref 0–55)
AST: 17 U/L (ref 5–34)
Anion Gap: 10 mEq/L (ref 3–11)
BUN: 18.8 mg/dL (ref 7.0–26.0)
CO2: 24 mEq/L (ref 22–29)
Calcium: 9.3 mg/dL (ref 8.4–10.4)
Chloride: 107 mEq/L (ref 98–109)
Creatinine: 0.8 mg/dL (ref 0.6–1.1)
EGFR: 72 mL/min/{1.73_m2} — AB (ref >90)
GLUCOSE: 110 mg/dL (ref 70–140)
POTASSIUM: 3.8 meq/L (ref 3.5–5.1)
SODIUM: 142 meq/L (ref 136–145)
TOTAL PROTEIN: 5.6 g/dL — AB (ref 6.4–8.3)
Total Bilirubin: 0.89 mg/dL (ref 0.20–1.20)

## 2016-10-23 MED ORDER — DIPHENHYDRAMINE HCL 25 MG PO CAPS
ORAL_CAPSULE | ORAL | Status: AC
  Filled 2016-10-23: qty 2

## 2016-10-23 MED ORDER — DIPHENHYDRAMINE HCL 25 MG PO CAPS
50.0000 mg | ORAL_CAPSULE | Freq: Once | ORAL | Status: AC
  Administered 2016-10-23: 50 mg via ORAL

## 2016-10-23 MED ORDER — ACETAMINOPHEN 325 MG PO TABS
650.0000 mg | ORAL_TABLET | Freq: Once | ORAL | Status: AC
  Administered 2016-10-23: 650 mg via ORAL

## 2016-10-23 MED ORDER — METHYLPREDNISOLONE SODIUM SUCC 125 MG IJ SOLR
125.0000 mg | Freq: Once | INTRAMUSCULAR | Status: AC
Start: 2016-10-23 — End: 2016-10-23
  Administered 2016-10-23: 125 mg via INTRAVENOUS

## 2016-10-23 MED ORDER — PROCHLORPERAZINE MALEATE 10 MG PO TABS
ORAL_TABLET | ORAL | Status: AC
  Filled 2016-10-23: qty 1

## 2016-10-23 MED ORDER — MONTELUKAST SODIUM 10 MG PO TABS
ORAL_TABLET | ORAL | Status: AC
  Filled 2016-10-23: qty 1

## 2016-10-23 MED ORDER — SODIUM CHLORIDE 0.9 % IV SOLN
16.0000 mg/kg | Freq: Once | INTRAVENOUS | Status: AC
  Administered 2016-10-23: 900 mg via INTRAVENOUS
  Filled 2016-10-23: qty 40

## 2016-10-23 MED ORDER — ZOLEDRONIC ACID 4 MG/5ML IV CONC
3.5000 mg | Freq: Once | INTRAVENOUS | Status: AC
  Administered 2016-10-23: 3.5 mg via INTRAVENOUS
  Filled 2016-10-23: qty 4.38

## 2016-10-23 MED ORDER — ACETAMINOPHEN 325 MG PO TABS
ORAL_TABLET | ORAL | Status: AC
  Filled 2016-10-23: qty 2

## 2016-10-23 MED ORDER — METHYLPREDNISOLONE SODIUM SUCC 125 MG IJ SOLR
INTRAMUSCULAR | Status: AC
  Filled 2016-10-23: qty 2

## 2016-10-23 MED ORDER — MONTELUKAST SODIUM 10 MG PO TABS
10.0000 mg | ORAL_TABLET | Freq: Once | ORAL | Status: AC
Start: 2016-10-23 — End: 2016-10-23
  Administered 2016-10-23: 10 mg via ORAL

## 2016-10-23 MED ORDER — SODIUM CHLORIDE 0.9 % IV SOLN
Freq: Once | INTRAVENOUS | Status: AC
  Administered 2016-10-23: 10:00:00 via INTRAVENOUS

## 2016-10-23 MED ORDER — PROCHLORPERAZINE MALEATE 10 MG PO TABS
10.0000 mg | ORAL_TABLET | Freq: Once | ORAL | Status: AC
Start: 2016-10-23 — End: 2016-10-23
  Administered 2016-10-23: 10 mg via ORAL

## 2016-10-23 NOTE — Patient Instructions (Signed)
[place] Discharge Instructions for Patients Receiving Chemotherapy  Today you received the following chemotherapy agents: Darzalex   To help prevent nausea and vomiting after your treatment, we encourage you to take your nausea medication as directed.    If you develop nausea and vomiting that is not controlled by your nausea medication, call the clinic.   BELOW ARE SYMPTOMS THAT SHOULD BE REPORTED IMMEDIATELY:  *FEVER GREATER THAN 100.5 F  *CHILLS WITH OR WITHOUT FEVER  NAUSEA AND VOMITING THAT IS NOT CONTROLLED WITH YOUR NAUSEA MEDICATION  *UNUSUAL SHORTNESS OF BREATH  *UNUSUAL BRUISING OR BLEEDING  TENDERNESS IN MOUTH AND THROAT WITH OR WITHOUT PRESENCE OF ULCERS  *URINARY PROBLEMS  *BOWEL PROBLEMS  UNUSUAL RASH Items with * indicate a potential emergency and should be followed up as soon as possible.  Feel free to call the clinic you have any questions or concerns. The clinic phone number is [phone].  Please show the [place] at check-in to the Emergency Department and triage nurse.   Zoledronic Acid injection (Hypercalcemia, Oncology) What is this medicine? ZOLEDRONIC ACID (ZOE le dron ik AS id) lowers the amount of calcium loss from bone. It is used to treat too much calcium in your blood from cancer. It is also used to prevent complications of cancer that has spread to the bone. This medicine may be used for other purposes; ask your health care provider or pharmacist if you have questions. COMMON BRAND NAME(S): Zometa What should I tell my health care provider before I take this medicine? They need to know if you have any of these conditions: -aspirin-sensitive asthma -cancer, especially if you are receiving medicines used to treat cancer -dental disease or wear dentures -infection -kidney disease -receiving corticosteroids like dexamethasone or prednisone -an unusual or allergic reaction to zoledronic acid, other medicines,  foods, dyes, or preservatives -pregnant or trying to get pregnant -breast-feeding How should I use this medicine? This medicine is for infusion into a vein. It is given by a health care professional in a hospital or clinic setting. Talk to your pediatrician regarding the use of this medicine in children. Special care may be needed. Overdosage: If you think you have taken too much of this medicine contact a poison control center or emergency room at once. NOTE: This medicine is only for you. Do not share this medicine with others. What if I miss a dose? It is important not to miss your dose. Call your doctor or health care professional if you are unable to keep an appointment. What may interact with this medicine? -certain antibiotics given by injection -NSAIDs, medicines for pain and inflammation, like ibuprofen or naproxen -some diuretics like bumetanide, furosemide -teriparatide -thalidomide This list may not describe all possible interactions. Give your health care provider a list of all the medicines, herbs, non-prescription drugs, or dietary supplements you use. Also tell them if you smoke, drink alcohol, or use illegal drugs. Some items may interact with your medicine. What should I watch for while using this medicine? Visit your doctor or health care professional for regular checkups. It may be some time before you see the benefit from this medicine. Do not stop taking your medicine unless your doctor tells you to. Your doctor may order blood tests or other tests to see how you are doing. Women should inform their doctor if they wish to become pregnant or think they might be pregnant. There is a potential for serious side effects to an unborn child.  Talk to your health care professional or pharmacist for more information. You should make sure that you get enough calcium and vitamin D while you are taking this medicine. Discuss the foods you eat and the vitamins you take with your health  care professional. Some people who take this medicine have severe bone, joint, and/or muscle pain. This medicine may also increase your risk for jaw problems or a broken thigh bone. Tell your doctor right away if you have severe pain in your jaw, bones, joints, or muscles. Tell your doctor if you have any pain that does not go away or that gets worse. Tell your dentist and dental surgeon that you are taking this medicine. You should not have major dental surgery while on this medicine. See your dentist to have a dental exam and fix any dental problems before starting this medicine. Take good care of your teeth while on this medicine. Make sure you see your dentist for regular follow-up appointments. What side effects may I notice from receiving this medicine? Side effects that you should report to your doctor or health care professional as soon as possible: -allergic reactions like skin rash, itching or hives, swelling of the face, lips, or tongue -anxiety, confusion, or depression -breathing problems -changes in vision -eye pain -feeling faint or lightheaded, falls -jaw pain, especially after dental work -mouth sores -muscle cramps, stiffness, or weakness -redness, blistering, peeling or loosening of the skin, including inside the mouth -trouble passing urine or change in the amount of urine Side effects that usually do not require medical attention (report to your doctor or health care professional if they continue or are bothersome): -bone, joint, or muscle pain -constipation -diarrhea -fever -hair loss -irritation at site where injected -loss of appetite -nausea, vomiting -stomach upset -trouble sleeping -trouble swallowing -weak or tired This list may not describe all possible side effects. Call your doctor for medical advice about side effects. You may report side effects to FDA at 1-800-FDA-1088. Where should I keep my medicine? This drug is given in a hospital or clinic and  will not be stored at home. NOTE: This sheet is a summary. It may not cover all possible information. If you have questions about this medicine, talk to your doctor, pharmacist, or health care provider.  2018 Elsevier/Gold Standard (2013-11-27 14:19:39)

## 2016-10-24 ENCOUNTER — Telehealth: Payer: Self-pay | Admitting: Hematology

## 2016-10-24 ENCOUNTER — Other Ambulatory Visit: Payer: Medicare Other

## 2016-10-24 ENCOUNTER — Ambulatory Visit: Payer: Medicare Other | Admitting: Hematology

## 2016-10-24 ENCOUNTER — Ambulatory Visit: Payer: Medicare Other

## 2016-10-24 LAB — KAPPA/LAMBDA LIGHT CHAINS
IG KAPPA FREE LIGHT CHAIN: 4.9 mg/L (ref 3.3–19.4)
IG LAMBDA FREE LIGHT CHAIN: 3.5 mg/L — AB (ref 5.7–26.3)
KAPPA/LAMBDA FLC RATIO: 1.4 (ref 0.26–1.65)

## 2016-10-24 NOTE — Telephone Encounter (Signed)
Patient saw Dr Burr Medico yesterday 4/11 and was not able to go to scheduleing she needs to make an appointment on either/or 5/2, 5/3, 5/4 she will be out of town before that.  Patient states that she uses MyChart and will get her appointment that way

## 2016-10-24 NOTE — Telephone Encounter (Signed)
Patient bypassed scheduling. Appointments scheduled per 10/23/16 los. Per Caryl Pina, patient will view appointment schedule via [place].

## 2016-10-25 LAB — MULTIPLE MYELOMA PANEL, SERUM
ALPHA2 GLOB SERPL ELPH-MCNC: 0.5 g/dL (ref 0.4–1.0)
Albumin SerPl Elph-Mcnc: 3.6 g/dL (ref 2.9–4.4)
Albumin/Glob SerPl: 2.2 — ABNORMAL HIGH (ref 0.7–1.7)
Alpha 1: 0.2 g/dL (ref 0.0–0.4)
B-GLOBULIN SERPL ELPH-MCNC: 0.7 g/dL (ref 0.7–1.3)
GAMMA GLOB SERPL ELPH-MCNC: 0.3 g/dL — AB (ref 0.4–1.8)
GLOBULIN, TOTAL: 1.7 g/dL — AB (ref 2.2–3.9)
IGG (IMMUNOGLOBIN G), SERUM: 374 mg/dL — AB (ref 700–1600)
IgA, Qn, Serum: <5 mg/dL — ABNORMAL LOW (ref 64–422)
IgM, Qn, Serum: 13 mg/dL — ABNORMAL LOW (ref 26–217)
M PROTEIN SERPL ELPH-MCNC: 0.1 g/dL — AB
TOTAL PROTEIN: 5.3 g/dL — AB (ref 6.0–8.5)

## 2016-11-12 NOTE — Progress Notes (Signed)
[place], [place] [address]  DIAGNOSIS: Multiple myeloma with relapse  CHIEF COMPLAINS: Follow-up of multiple myeloma  CURRENT TREATMENT:   1. Pomalidomide 4 mg daily on day 1-21, dexamethasone 40 mg on day 1, 8 and 15, every 28 days, started on 02/27/2016, daratumumab weekly started on 03/07/2016, treatment on hold since 03/09/2016 due to hospitalization and severe cytopenia 2. Daratumumab restarted on 04/26/2016, Velcade 1.'3mg'$ /m2 and dexa '20mg'$  weekly start on 11/3, velcade held since 06/28/2016 due to CHF  2. Zometa 3.5 mg monthly started on 06/2013, on every 3 months now  Oncology History   Multiple myeloma [facility])   Staging form: Multiple Myeloma, AJCC 6th Edition   - Clinical: Stage IIIA - Signed by [name], MD on 09/04/2013      Multiple myeloma ([place])   05/26/2012 Initial Diagnosis    Presenting IgG was 4,040 mg/dL on 06/05/2012 (IgA 35; IgM 34); kappa free light chain 34.4 mg/dL, lambda 0.00, kappa:lambda ratio of 34.75; SPEP with M-spike of 2.60.      06/30/2012 Imaging    Numerous lytic lesions throughout the calvarium.  Lytic lesion within the left lateral scapula.  Findings most compatible with metastases or myeloma. Multiple mid and lower thoracic compression fractures.  Slight compression through the endplates at L3.      60/45/4098 Bone Marrow Biopsy    Bone marrow biopsy showed 49% plasma cell. Normal classical cytogenetics; however, myeloma FISH panel showed 13q- (intermediate risk)          07/14/2012 - 07/27/2012 Radiation Therapy    Palliative radiation 20 Gy over 10 fractions between to thoracic spine cord compression and symptomatic left scapula lytic lesion.      08/10/2012 -  Chemotherapy    Started SQ Velcade once weekly, 3 weeks on, 1 weeks off; daily Revlimid d1-21, 7 days off; and Dexamethasone '40mg'$  PO weekly.       02/12/2013 Bone Marrow Transplant     Auto bone marrow transplant at [facility]        06/14/2013 Tumor Marker    IgG  1830,  Kappa:lamba ratio, 1.69 (baseline was 34.75 or   M-spike 0.28 (baseline of 2.6 or  89.3% of baseline).        06/25/2013 -  Chemotherapy    Started zometa 3.5 mg monthly       09/01/2013 -  Chemotherapy    Maintenance therapy with revlimid '5mg'$  daily for 14 days and then off for 7 (decreased from 21 days on and 7 days off based on neutropenia).       09/13/2013 - 09/17/2014 Hospital Admission    Hospital admission for pneumonia      12/20/2013 Treatment Plan Change    Maintenance therapy decreased to 2.5 mg daily for 21 days and then off for 7 days based on low counts.       01/25/2014 Tumor Marker    IgG 1360 M spike 0.5      03/01/2014 - 03/05/2014 Hospital Admission    [facility] center with an inferior STEMI, received thrombolytic therapy, cath showed 60% prox RCA, mid-distal RCA 50%, Echo normal, no stents.      04/11/2014 - 08/07/2014 Chemotherapy    Continue Revlimid at 2.5 mg 3 weeks on 1 week off      08/08/2014 - 08/13/2014 Hospital Admission    Hospital admission for pneumonia. Her Revlimid was held.  08/29/2014 - 09/14/2014 Chemotherapy    Maintenance Revlimid restarted      09/14/2014 - 09/17/2014 Hospital Admission    Admitted for pneumonia after coming back from a trip in [name]. Revlimid was held again.      11/13/2014 - 12/25/2015 Chemotherapy    Maintenance Revlimid restarted, changed to 2.'5mg'$  daily, 2 weks on, 1 week off from 12/15/2014, stopped due to disease progression       01/24/2016 Progression    Patient is M protein has gradually increased to 1.0g, repeated a bone marrow biopsy showed plasma cell 10-30%      02/27/2016 -  Chemotherapy    Pomalidomide 4 mg daily on day 1-21, dexamethasone 40 mg on day 1, 8 and 15, every 28 days, started on 02/27/2016, daratumumab weekly started on 03/07/2016, held after first dose, due to hospitalization and a  severe pancytopenia      03/10/2016 - 03/29/2016 Hospital Admission    Pt was admitted for sepsis from pneumonia, and severe pancytopenia. She required ICU stay for a few days due to hypotension, developed b/l pleural effusion required diuretics and b/l thoracentesis. She also required blood and plt transfusion, and prolonged neupogen injection for severe neutropenia. She was discharged home after 3 weeks hospital stay       04/26/2016 -  Chemotherapy    Daratumumab restarted on 04/26/2016, Velcade 1.'3mg'$ /m2 and dexa '20mg'$  weekly start on 11/3, velcade held since 06/28/2016 due to CHF       08/26/2016 Echocardiogram    - Left ventricle: LVEF is approximately 35% with diffuse diffuse   hypokinesis with severe hypokinesis/ akinesis of the   inferior/inferoseptal walls. In comparison to echo images from   December 2017, there does not appear to be significant change The   cavity size was normal. Wall thickness was normal. Doppler   parameters are consistent with abnormal left ventricular   relaxation (grade 1 diastolic dysfunction). - Aortic valve: AV is thickened. 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 Present in echo from December 2017 There was   trivial regurgitation. - Right ventricle: Systolic function was mildly reduced. - Right atrium: The atrium was mildly dilated. - Pericardium, extracardiac: A trivial pericardial effusion was   identified.      INTERVAL HISTORY:  Shannon Rush 76 y.o. female with a past medical history of MM as detailed above, s/p SCT is here for follow-up. The patient presents to the clinic today speaking of her trip to the [facility] with her family. She reported no fever and that she experienced some high altitude effects and diarrhea but they have now resolved. She reports her dog pushing her down and her left hand has slight abrasion. She reports her feet and back hurt but that it is her  usual pain. She reports that she remembered to take her steroids. She has another trip coming up around her 4th cycle so she is requesting to push that back.    MEDICAL HISTORY: Past Medical History:  Diagnosis Date  . CAD (coronary artery disease)    a. inf STEMI (in [place] >>> [facility]) >>> LHC (8/15- [place]):  pRCA 60%, mid to dist RCA 50% >>> med rx  . Cord compression ([place]) 07/13/12   MRI- diffuse myeloma involvement of T-L spine  . History of radiation therapy 07/13/12-07/27/12   spinal cord compression T3-T10,left scapula  . Hx of echocardiogram    Echo (03/02/14-done  at [facility]):  Normal LVEF without significant regional wall motion abnormalities. Mild   . Multiple myeloma ([place]) 07/01/2012  . OSTEOPOROSIS 06/11/2010   Multiple compression fractures; and spontaneous fracture of sternum Qualifier: Diagnosis of  By: [name], [name]     . Thoracic kyphosis 07/13/12   per MRI scan  . Unspecified deficiency anemia      ALLERGIES:  is allergic to zosyn [piperacillin sod-tazobactam so]; levaquin [levofloxacin in d5w]; other; piperacillin-tazobactam in dex; zithromax [azithromycin]; and ciprofloxacin.  MEDICATIONS: has a current medication list which includes the following prescription(s): acetaminophen, acyclovir, aspirin ec, atorvastatin, calcium carbonate, dexamethasone, doxycycline, fluticasone, lisinopril, metoprolol succinate, nitroglycerin, ondansetron, proair hfa, and prochlorperazine, and the following Facility-Administered Medications: sodium chloride, sodium chloride, acetaminophen, acetaminophen, furosemide, furosemide, heparin lock flush, heparin lock flush, heparin lock flush, heparin lock flush, sodium chloride flush, sodium chloride flush, sodium chloride flush, sodium chloride flush, and sodium chloride flush.  SURGICAL HISTORY:  Past Surgical History:  Procedure Laterality Date  . APPENDECTOMY    . CESAREAN SECTION      x2   . COLONOSCOPY  2007   neg with Dr. [name]  . ELBOW SURGERY    . HIP SURGERY  2009   left  . TUBAL LIGATION      REVIEW OF SYSTEMS:  Constitutional: Denies abnormal weight loss Eyes: Denies blurriness of vision Ears, nose, mouth, throat, and face: Denies mucositis or sore throat,  Respiratory: Denies dyspnea or wheezes Cardiovascular: Denies palpitation, chest discomfort  Gastrointestinal:  Denies nausea, heartburn or change in bowel habits Skin: Denies abnormal skin rashes Lymphatics: Denies new lymphadenopathy or easy bruising Neurological:Denies numbness, tingling or new weaknesses Behavioral/Psych: Mood is stable, no new changes (+) anxiety over husband's health and passing of son MSK: (+) foot and back pain All other systems were reviewed with the patient and are negative.   PHYSICAL EXAMINATION: ECOG PERFORMANCE STATUS: 1  BP 110/64 (BP Location: Left Arm, Patient Position: Sitting)   Pulse 82   Temp 97.7 F (36.5 C) (Oral)   Resp 18   Ht '5\' 6"'$  (1.676 m)   Wt 123 lb (55.8 kg)   SpO2 100%   BMI 19.85 kg/m   GENERAL:alert, no distress and comfortable; easily mobile to exam table; kyphosis, moderate.  SKIN: skin color, texture, turgor are normal, no rashes or significant lesions except a small healing, dry wound in the right lower leg above his ankle, with surrounding skin erythema and swelling EYES: normal, Conjunctiva are pink, 1 tear/injectionof blood vessel left eye in sclera. OROPHARYNX:no exudate, no erythema and lips, buccal mucosa, and tongue normal, no ONJ NECK: supple, thyroid normal size, non-tender, without nodularity; nodules cervical bilaterally.  LYMPH:  no palpable lymphadenopathy in the cervical, axillary or supraclavicular LUNGS: crackles at the bases bilaterally with normal breathing effort, no wheezes or rhonchi HEART: regular rate & rhythm and no murmurs  ABDOMEN:abdomen soft, and normal bowel sounds (+) mild palpable liver under ribcage  slightly tender and enlarged  Musculoskeletal:no cyanosis of digits and no clubbing, (+) spine scoliosis  NEURO: alert & oriented x 3 with fluent speech, no focal motor/sensory deficits   Labs:  CBC Latest Ref Rng & Units 11/13/2016 10/23/2016 10/11/2016  WBC 3.9 - 10.3 10e3/uL 4.9 3.5(L) 6.9  Hemoglobin 11.6 - 15.9 g/dL 10.8(L) 10.7(L) 11.0(L)  Hematocrit 34.8 - 46.6 % 31.8(L) 30.9(L) 31.3(L)  Platelets 145 - 400 10e3/uL 185 150 139(L)    CMP Latest Ref Rng & Units 11/13/2016 10/23/2016 10/23/2016  Glucose 70 - 140 mg/dl 212(H) 110 -  BUN 7.0 - 26.0 mg/dL 19.5 18.8 -  Creatinine 0.6 - 1.1 mg/dL 0.9 0.8 -  Sodium 136 - 145 mEq/L 137 142 -  Potassium 3.5 - 5.1 mEq/L 4.0 3.8 -  Chloride 101 - 111 mmol/L - - -  CO2 22 - 29 mEq/L 17(L) 24 -  Calcium 8.4 - 10.4 mg/dL 9.2 9.3 -  Total Protein 6.4 - 8.3 g/dL 6.3(L) 5.3(L) 5.6(L)  Total Bilirubin 0.20 - 1.20 mg/dL 0.88 0.89 -  Alkaline Phos 40 - 150 U/L 92 45 -  AST 5 - 34 U/L 27 17 -  ALT 0 - 55 U/L 25 15 -    SPEP M-PROTEIN 10/01/2013: 0.18 (nadir)  10/13/2014: 0.6 11/21/2014: 0.5 01/30/2015: 0.6 03/13/2015: 0.6 04/20/2015: 0.5 06/01/2015: 0.5 07/20/2015: 0.7 09/14/2015: 0.7 10/26/2015: 0.8 12/07/2015: 1.0 02/07/2016: 1.1  04/03/2016: 0.2 05/03/2016: 0.2  05/31/2016: 0.1 06/28/2016: 0.1 07/25/2016: 0.1 08/21/2016: 0.1 09/27/2016: 0.1 10/23/16: 0.1  IgG [phone]) 10/13/2014: 1710 11/21/2014: 1460 01/30/2015: 1380 03/13/15: 1570 04/20/2015: 1550 06/01/2015: 1590 07/22/2015: 1770 09/14/2015: 4098 10/26/2015: 1898 12/07/2015: 2045 02/07/2016: 1883 04/03/2016: 787 05/03/2016: 685  05/31/2016: 544 06/28/2016: 463 07/25/2016: 455 08/21/2016: 426 09/27/2016: 438 10/23/16: 374  Serum kappa, lambda light chains, and ratio  11/21/2014: 3.95, 2.08, 1.90  01/30/2015: 3.92, 2.64, 1.48 03/13/2015: 3.55, 2.17, 1.64 04/20/15: 4.52, 1.78, 2.54 06/01/2015: 4.82, 1.75, 2.75 07/20/2015: 8.71, 2.43, 3.58 09/14/2015: 9.17, 2.02, 4.55 10/26/2015: 11.4, 1.75,  6.42 12/07/2015: 1.54, 1.83, 8.44 02/07/2016: 1.93, 1.42, 13.6 04/03/2016: 0.96, 0.95, 1.01  05/03/2016: 0.71, 0.34, 2.09  05/31/2016: 0.48, 0.26, 1.85 06/28/2016: 0.52, 0.60, 0.87 07/25/2016: 0.46, 0.27, 1.70 08/21/2016: 0.43, 0.27, 1.59 09/27/2016: 0.52, 0.24, 2.17 10/23/16: .0.49, 0.35, 1.40  24 hr urine PEP (total protein, M-spike) 10/13/2014: (-) 02/12/2016: '265mg'$ , '68mg'$  04/05/2016: 114 mg, (-) 08/08/16: 99 10/23/16: PENDING  PATHOLOGY REPORT   Diagnosis 01/24/2016 Bone Marrow, Aspirate,Biopsy, and Clot, RT iliac and core - VARIABLY CELLULAR BONE MARROW WITH PLASMA CELL NEOPLASM. - SEE COMMENT. PERIPHERAL BLOOD: - MACROCYTIC ANEMIA. Diagnosis Note The bone marrow is variably cellular with trilineage hematopoiesis and non specific myeloid changes. In this background, the plasma cell component is variable ranging from less than 10 % in many areas to 30% in some aspirate particles with relatively low cellularity. Foci of interstitial infiltrates and small clusters are also seen in the clot section. Immunohistochemical stains show kappa light chain restriction in atypical plasma cell clusters and infiltrates consistent with plasma cell neoplasm. Correlation with cytogenetic and FISH studies is recommended. (BNS:gt, 2016-02-12)  Diagnosis 03/28/16 PLEURAL FLUID, LEFT (SPECIMEN 1 OF 1 COLLECTED 03/28/16): REACTIVE MESOTHELIAL CELLS  RADIOGRAPHIC STUDIES:  Echo 08/26/2016 - Left ventricle: LVEF is approximately 35% with diffuse diffuse   hypokinesis with severe hypokinesis/ akinesis of the   inferior/inferoseptal walls. In comparison to echo images from   December 2017, there does not appear to be significant change The   cavity size was normal. Wall thickness was normal. Doppler   parameters are consistent with abnormal left ventricular   relaxation (grade 1 diastolic dysfunction). - Aortic valve: AV is thickened. There is a small mobile   echodenisity on ventricular surface consistent with  possible   fibroelastoma. Present in echo from December 2017 There was   trivial regurgitation. - Right ventricle: Systolic function was mildly reduced. - Right atrium: The atrium was mildly dilated. - Pericardium, extracardiac: A trivial pericardial effusion was   identified.  ASSESSMENT: Shannon Rush 76 y.o. female with a history  of Multiple Myeloma s/p autotransplant in August 2014 followed by maintenence low dose Revlimid therapy, and relapsed in 01/2016  PLAN:   1. IgG kappa Multiple myeloma s/p Auto SCT, intermediate risk, relapsed in 01/2016 --MM markers demonstrate a very good initial response (M protein baseline 2.6, down to 0.18 after induction chemo and transplant, stable around 0.5 since July 2015, but has gradually increased lately),  she unfortunately relapsed in July 2017 -I previously discussed her repeated bone marrow biopsy from 02/01/2016, which showed 10-30% plasma cells in her marrow -I agree with Dr. Kendell Bane recommendation to stop revlimid and change to Daratumumab, Pomalidomide and dexamethasone due to her disease relapse. I don't think she has resistance to Revlimid, she has been on very small dose of Revlimid due to her prior history of multiple infections. -She started first cycle DaraPD in 02/2016, but in 2 weeks she developed severe pancytopenia, pneumonia with sepsis required 3 weeks hospital stay. She did have a very nice response to the treatment, her M-protein has significantly reduced from 1.2 to 0.2, light chain levels normalized even with half cycle treatment  -I recommended her to change regiment to DaraVD, and started her on dara every 2 weeks for 2 doses, she tolerated this very well -she has had good response to treatment, M-protein significantly dropped, will get dara-specific IFE next time to see if she still has M-protein  -Her previous echo in December 2017 showed a significant drop on EF, she is not much symptomatically for CHF, due to the potential  contribution of Velcade to her CHF, I'll hold her Velcade for now. Her repeated echo in February 2018 showed improved EF 35%. -Significant cardiotoxicity including CHF has not been reported in [place], I'll continue, she is on every 2 weeks now, will change to every 4 weeks soon, along with dexamethasone 20 mg the day before infusion  -lab prevsiouly reviewed, mild anemia, stable, will proceed with Dara as scheduled -She hasn't had Velcade since December 2017. Will continue holding it for now due to her CHF -Labs reviewed and she is adequate to continue treatment today   2. Pancytopenia  -secondary to chemo  - mild anemia stable -Continue monitoring closely  3. CHF, CAD, inferior STEMI in 02/2014 -continue metoprolol and lipitor, Dr. Meda Coffee previously started her on lisinopril also -previously followed up with Dr. Meda Coffee  -her previous echo on 08/26/16 showed improvement with EF 30-35% from 25-30%. -I previously reviewed the echo with the patient.   4. Osteoporosis -previous bone density scan showed worsening osteoporosis, continue zometa, changed to every 3 months after 03/2015.  -she declined repeating DEXA  -she has been on zometa for 4 years, the risk is probably overweight the benefit at this point, will stop    Plan -Reviewed labs, they are adequate for her to continue Dara today, postpone next cycle for one week due to her travel plan -Lab, f/u and daratumumab in 3 and 7 weeks  -will stop zometa, she has completed 4 years therapy  -Echo scheduled for 5/21 by her cardiologist    All questions were answered. The patient knows to call the clinic with any problems, questions or concerns. We can certainly see the patient much sooner if necessary.  I spent 20 minutes counseling the patient face to face. The total time spent in the appointment was 25 minutes.   [name]  11/13/2016   This document serves as a record of services personally performed by [name], MD. It was created on her  behalf by Joslyn Devon, a trained medical scribe. The creation of this record is based on the scribe's personal observations and the provider's statements to them. This document has been checked and approved by the attending provider.

## 2016-11-13 ENCOUNTER — Other Ambulatory Visit (HOSPITAL_BASED_OUTPATIENT_CLINIC_OR_DEPARTMENT_OTHER): Payer: Medicare Other

## 2016-11-13 ENCOUNTER — Ambulatory Visit (HOSPITAL_BASED_OUTPATIENT_CLINIC_OR_DEPARTMENT_OTHER): Payer: Medicare Other | Admitting: Hematology

## 2016-11-13 ENCOUNTER — Ambulatory Visit (HOSPITAL_BASED_OUTPATIENT_CLINIC_OR_DEPARTMENT_OTHER): Payer: Medicare Other

## 2016-11-13 VITALS — BP 110/64 | HR 82 | Temp 97.7°F | Resp 18 | Ht 66.0 in | Wt 123.0 lb

## 2016-11-13 VITALS — BP 94/64 | HR 69 | Temp 97.4°F | Resp 17

## 2016-11-13 DIAGNOSIS — M818 Other osteoporosis without current pathological fracture: Secondary | ICD-10-CM | POA: Diagnosis not present

## 2016-11-13 DIAGNOSIS — I509 Heart failure, unspecified: Secondary | ICD-10-CM | POA: Diagnosis not present

## 2016-11-13 DIAGNOSIS — C9000 Multiple myeloma not having achieved remission: Secondary | ICD-10-CM

## 2016-11-13 DIAGNOSIS — C9002 Multiple myeloma in relapse: Secondary | ICD-10-CM

## 2016-11-13 DIAGNOSIS — Z5112 Encounter for antineoplastic immunotherapy: Secondary | ICD-10-CM

## 2016-11-13 LAB — COMPREHENSIVE METABOLIC PANEL
ALBUMIN: 3.7 g/dL (ref 3.5–5.0)
ALK PHOS: 92 U/L (ref 40–150)
ALT: 25 U/L (ref 0–55)
AST: 27 U/L (ref 5–34)
Anion Gap: 13 mEq/L — ABNORMAL HIGH (ref 3–11)
BILIRUBIN TOTAL: 0.88 mg/dL (ref 0.20–1.20)
BUN: 19.5 mg/dL (ref 7.0–26.0)
CALCIUM: 9.2 mg/dL (ref 8.4–10.4)
CO2: 17 mEq/L — ABNORMAL LOW (ref 22–29)
Chloride: 107 mEq/L (ref 98–109)
Creatinine: 0.9 mg/dL (ref 0.6–1.1)
EGFR: 60 mL/min/{1.73_m2} — AB (ref >90)
Glucose: 212 mg/dl — ABNORMAL HIGH (ref 70–140)
POTASSIUM: 4 meq/L (ref 3.5–5.1)
Sodium: 137 mEq/L (ref 136–145)
TOTAL PROTEIN: 6.3 g/dL — AB (ref 6.4–8.3)

## 2016-11-13 LAB — CBC WITH DIFFERENTIAL/PLATELET
BASO%: 0.2 % (ref 0.0–2.0)
BASOS ABS: 0 10*3/uL (ref 0.0–0.1)
EOS ABS: 0 10*3/uL (ref 0.0–0.5)
EOS%: 0 % (ref 0.0–7.0)
HCT: 31.8 % — ABNORMAL LOW (ref 34.8–46.6)
HGB: 10.8 g/dL — ABNORMAL LOW (ref 11.6–15.9)
LYMPH%: 4.5 % — ABNORMAL LOW (ref 14.0–49.7)
MCH: 36 pg — AB (ref 25.1–34.0)
MCHC: 34 g/dL (ref 31.5–36.0)
MCV: 106 fL — AB (ref 79.5–101.0)
MONO#: 0 10*3/uL — ABNORMAL LOW (ref 0.1–0.9)
MONO%: 0.4 % (ref 0.0–14.0)
NEUT#: 4.6 10*3/uL (ref 1.5–6.5)
NEUT%: 94.9 % — AB (ref 38.4–76.8)
NRBC: 0 % (ref 0–0)
PLATELETS: 185 10*3/uL (ref 145–400)
RBC: 3 10*6/uL — ABNORMAL LOW (ref 3.70–5.45)
RDW: 15.6 % — AB (ref 11.2–14.5)
WBC: 4.9 10*3/uL (ref 3.9–10.3)
lymph#: 0.2 10*3/uL — ABNORMAL LOW (ref 0.9–3.3)

## 2016-11-13 MED ORDER — DIPHENHYDRAMINE HCL 25 MG PO CAPS
50.0000 mg | ORAL_CAPSULE | Freq: Once | ORAL | Status: AC
  Administered 2016-11-13: 50 mg via ORAL

## 2016-11-13 MED ORDER — SODIUM CHLORIDE 0.9 % IV SOLN
Freq: Once | INTRAVENOUS | Status: AC
  Administered 2016-11-13: 11:00:00 via INTRAVENOUS

## 2016-11-13 MED ORDER — ACETAMINOPHEN 325 MG PO TABS
ORAL_TABLET | ORAL | Status: AC
  Filled 2016-11-13: qty 2

## 2016-11-13 MED ORDER — MONTELUKAST SODIUM 10 MG PO TABS
10.0000 mg | ORAL_TABLET | Freq: Once | ORAL | Status: AC
  Administered 2016-11-13: 10 mg via ORAL

## 2016-11-13 MED ORDER — METHYLPREDNISOLONE SODIUM SUCC 125 MG IJ SOLR
INTRAMUSCULAR | Status: AC
  Filled 2016-11-13: qty 2

## 2016-11-13 MED ORDER — MONTELUKAST SODIUM 10 MG PO TABS
ORAL_TABLET | ORAL | Status: AC
  Filled 2016-11-13: qty 1

## 2016-11-13 MED ORDER — DIPHENHYDRAMINE HCL 25 MG PO CAPS
ORAL_CAPSULE | ORAL | Status: AC
  Filled 2016-11-13: qty 2

## 2016-11-13 MED ORDER — ACETAMINOPHEN 325 MG PO TABS
650.0000 mg | ORAL_TABLET | Freq: Once | ORAL | Status: AC
  Administered 2016-11-13: 650 mg via ORAL

## 2016-11-13 MED ORDER — PROCHLORPERAZINE MALEATE 10 MG PO TABS
10.0000 mg | ORAL_TABLET | Freq: Once | ORAL | Status: AC
  Administered 2016-11-13: 10 mg via ORAL

## 2016-11-13 MED ORDER — DARATUMUMAB CHEMO INJECTION 400 MG/20ML
16.0000 mg/kg | Freq: Once | INTRAVENOUS | Status: AC
  Administered 2016-11-13: 900 mg via INTRAVENOUS
  Filled 2016-11-13: qty 5

## 2016-11-13 MED ORDER — PROCHLORPERAZINE MALEATE 10 MG PO TABS
ORAL_TABLET | ORAL | Status: AC
  Filled 2016-11-13: qty 1

## 2016-11-13 MED ORDER — METHYLPREDNISOLONE SODIUM SUCC 125 MG IJ SOLR
125.0000 mg | Freq: Once | INTRAMUSCULAR | Status: AC
  Administered 2016-11-13: 125 mg via INTRAVENOUS

## 2016-11-13 NOTE — Patient Instructions (Signed)
[facility] Discharge Instructions for Patients Receiving Chemotherapy  Today you received the following chemotherapy agents Darzalex.  To help prevent nausea and vomiting after your treatment, we encourage you to take your nausea medication as directed.  If you develop nausea and vomiting that is not controlled by your nausea medication, call the clinic.   BELOW ARE SYMPTOMS THAT SHOULD BE REPORTED IMMEDIATELY:  *FEVER GREATER THAN 100.5 F  *CHILLS WITH OR WITHOUT FEVER  NAUSEA AND VOMITING THAT IS NOT CONTROLLED WITH YOUR NAUSEA MEDICATION  *UNUSUAL SHORTNESS OF BREATH  *UNUSUAL BRUISING OR BLEEDING  TENDERNESS IN MOUTH AND THROAT WITH OR WITHOUT PRESENCE OF ULCERS  *URINARY PROBLEMS  *BOWEL PROBLEMS  UNUSUAL RASH Items with * indicate a potential emergency and should be followed up as soon as possible.  Feel free to call the clinic you have any questions or concerns. The clinic phone number is [phone].  Please show the CHEMO ALERT CARD at check-in to the Emergency Department and triage nurse.    

## 2016-11-15 ENCOUNTER — Encounter: Payer: Self-pay | Admitting: Hematology

## 2016-11-18 ENCOUNTER — Ambulatory Visit (INDEPENDENT_AMBULATORY_CARE_PROVIDER_SITE_OTHER): Payer: Medicare Other | Admitting: Family Medicine

## 2016-11-18 ENCOUNTER — Encounter: Payer: Self-pay | Admitting: Family Medicine

## 2016-11-18 VITALS — BP 100/60 | HR 98 | Temp 97.8°F | Ht 66.0 in | Wt 120.4 lb

## 2016-11-18 DIAGNOSIS — A09 Infectious gastroenteritis and colitis, unspecified: Secondary | ICD-10-CM

## 2016-11-18 DIAGNOSIS — S6992XA Unspecified injury of left wrist, hand and finger(s), initial encounter: Secondary | ICD-10-CM

## 2016-11-18 MED ORDER — BISMUTH SUBSALICYLATE 262 MG PO TABS: ORAL_TABLET | ORAL | 0 refills | Status: DC

## 2016-11-18 MED ORDER — DOXYCYCLINE HYCLATE 100 MG PO TABS: 100.0000 mg | ORAL_TABLET | Freq: Two times a day (BID) | ORAL | 0 refills | Status: DC

## 2016-11-18 NOTE — Patient Instructions (Addendum)
Thank you so much for coming to visit today! I suspect your diarrhea is due to Traveler's Diarrhea. I recommend Pepto Bismol as needed for your diarrhea. Usually we exercise extra caution when treating possible infections of the hand. I have ordered an xray of your left hand to check for foreign body. We will also start an antibiotic to take twice daily for 7 days. Please return in 1 week or sooner if you notice worsening or fevers. Please let us know if you start having blood in your stool.  Dr. Gerlean Ren

## 2016-11-19 NOTE — Progress Notes (Signed)
Subjective:     Patient ID: Shannon Rush, female   DOB: 07/03/1941, 76 y.o.   MRN: [id number]  HPI Shannon Rush is a 76yo female presenting today for diarrhea and left hand pain.  # Diarrhea: Reports recent visit to [place] and the [place]. Developed diarrhea 2 weeks ago, 1 week into their trip. Returned 1 week ago. Notes slightly increased frequency of stools, occurring 3 times per day. Stools are watery and unformed. Denies blood in stool. Denies fever, abdominal pain, nausea, or vomiting. Does note decreased appetite, but has been eating. Has tried Immodium once with some relief. Allergies to Zosyn, Levaquin, Azithromycin, and Ciprofloxacin noted.  # Left Hand Pain: Was walking her dog one week ago when her dog pulled her over and she fell on her left hand. Initially with swelling and redness, but this has been improving. Now with localized redness and swelling with possible foreign body in left palm. Noted slight draining yesterday. Denies fever. Immunocompromised. Nonsmoker.  Review of Systems Per HPI    Objective:   Physical Exam  Constitutional: She appears well-developed and well-nourished. No distress.  HENT:  Head: Normocephalic and atraumatic.  Cardiovascular: Normal rate and regular rhythm.   No murmur heard. Pulmonary/Chest: Effort normal. No respiratory distress. She has no wheezes.  Abdominal: Soft. Bowel sounds are normal. She exhibits no distension. There is no tenderness.  Musculoskeletal:  No snuffbox tenderness noted bilaterally  Skin:  Area of fluctuance and mild erythema with slight discharge noted over left thenar eminence.   Psychiatric: She has a normal mood and affect. Her behavior is normal.      Assessment and Plan:     1. Traveler's diarrhea Recent trip to [place]. Unfortunately with significant allergies to multiple antibiotics. Recommend Pepto Bismol as needed for diarrhea, which should resolve on its own with time. To return if symptoms do  not improve over the next several weeks or sooner if she develops bloody stools or fever.  2. Injury of left hand, initial encounter Possible foreign body. Xrays of left hand ordered. Prescription for Doxycycline given. Reluctant to I&D given location over hand. Follow up in 1 week to check for foreign body when inflammation is decreased.

## 2016-11-20 ENCOUNTER — Telehealth: Payer: Self-pay | Admitting: Hematology

## 2016-11-20 NOTE — Telephone Encounter (Signed)
Called patient to inform her of next scheduled appointments for 5/25. LVM

## 2016-12-02 ENCOUNTER — Other Ambulatory Visit (HOSPITAL_COMMUNITY): Payer: Medicare Other

## 2016-12-03 ENCOUNTER — Other Ambulatory Visit: Payer: Self-pay

## 2016-12-03 ENCOUNTER — Ambulatory Visit (HOSPITAL_COMMUNITY): Payer: Medicare Other | Attending: Cardiovascular Disease

## 2016-12-03 DIAGNOSIS — I509 Heart failure, unspecified: Secondary | ICD-10-CM | POA: Insufficient documentation

## 2016-12-03 DIAGNOSIS — I429 Cardiomyopathy, unspecified: Secondary | ICD-10-CM

## 2016-12-03 DIAGNOSIS — I2510 Atherosclerotic heart disease of native coronary artery without angina pectoris: Secondary | ICD-10-CM | POA: Diagnosis not present

## 2016-12-03 DIAGNOSIS — I252 Old myocardial infarction: Secondary | ICD-10-CM | POA: Insufficient documentation

## 2016-12-03 DIAGNOSIS — I083 Combined rheumatic disorders of mitral, aortic and tricuspid valves: Secondary | ICD-10-CM | POA: Insufficient documentation

## 2016-12-04 ENCOUNTER — Ambulatory Visit (HOSPITAL_COMMUNITY)
Admission: RE | Admit: 2016-12-04 | Discharge: 2016-12-04 | Disposition: A | Discharge status: Home / Self Care | Payer: Medicare Other | Source: Ambulatory Visit | Attending: Cardiology | Admitting: Cardiology

## 2016-12-04 ENCOUNTER — Ambulatory Visit (INDEPENDENT_AMBULATORY_CARE_PROVIDER_SITE_OTHER): Payer: Medicare Other | Admitting: Cardiology

## 2016-12-04 VITALS — BP 124/60 | HR 84 | Ht 66.0 in | Wt 124.0 lb

## 2016-12-04 DIAGNOSIS — E785 Hyperlipidemia, unspecified: Secondary | ICD-10-CM

## 2016-12-04 DIAGNOSIS — I5022 Chronic systolic (congestive) heart failure: Secondary | ICD-10-CM | POA: Diagnosis not present

## 2016-12-04 DIAGNOSIS — I428 Other cardiomyopathies: Secondary | ICD-10-CM

## 2016-12-04 DIAGNOSIS — Z79899 Other long term (current) drug therapy: Secondary | ICD-10-CM | POA: Diagnosis not present

## 2016-12-04 DIAGNOSIS — I2510 Atherosclerotic heart disease of native coronary artery without angina pectoris: Secondary | ICD-10-CM | POA: Insufficient documentation

## 2016-12-04 DIAGNOSIS — I429 Cardiomyopathy, unspecified: Secondary | ICD-10-CM | POA: Diagnosis not present

## 2016-12-04 DIAGNOSIS — R600 Localized edema: Secondary | ICD-10-CM | POA: Insufficient documentation

## 2016-12-04 DIAGNOSIS — I359 Nonrheumatic aortic valve disorder, unspecified: Secondary | ICD-10-CM

## 2016-12-04 DIAGNOSIS — I358 Other nonrheumatic aortic valve disorders: Secondary | ICD-10-CM

## 2016-12-04 NOTE — Progress Notes (Signed)
Cardiology Office Note    Date:  12/06/2016   ID:  Shannon Rush, DOB 1940/07/29, MRN [id number]  PCP:  [name], MD  Cardiologist:  Dr. Meda Coffee  CC: follow up- cardiomyopathy     History of Present Illness:  Shannon Rush is a 76 y.o. female with a history of multiple myeloma s/p bone marrow transplant (followed at [facility] by Dr. [name] and in [facility] by Dr. Juliann Mule), osteoporosis, HLD and CAD: inferior STEMI s/p thrombolytics (02/2014) who presents to clinic for follow up.   Patient was seen by Dr. Johnsie Cancel in 12/2012. There were reports of an abnormal echocardiogram. 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   Admitted 8/18-8/22/15 to the [facility] with an inferior STEMI. She received thrombolytics. Cardiac catheterization demonstrated 60% proximal RCA, mid to distal RCA 50% (lesion segmental and thrombotic). Echocardiogram demonstrated normal LV function. Patient was continued on heparin and aspirin. No intervention was performed. Recommendation was for the patient to return to [facility] and have a nuclear stress test in 6 weeks. Patient was on Revlimid (for pancytopenia from chemotherapy) and the DC summary from the hospital in [place] cites this as a possible cause for her STEMI.   She has a known hx of multiple myeloma since 2013 treated with Revlimid, recently switched to pomalidomide, dexamethasone, daratumumab with resultant pancytopenia. She was admitted from 8/28-9/15/17 for sepsis and acute respiratory failure 2/2 HCAP with acute on chronic systolic CHF with pleural effusions. The pt was shocky and required IVF, transfer to ICU, and IV pressors. She was noted to be thrombocytopenic and her Plavix was stopped. She improved initially but has had persistent hypoxia. Chest CT 03/24/16 showed moderate bilateral effusions. She was given Lasix  but this has been stopped secondary to hypotension. Echo done 03/24/16 showed an EF of 45-50% with grade 2 DD. She required bilateral thoracocentesis and IV diuresis. She also received blood transfusions with symptomatic improvement. Discharged on 68m Lasix PRN. Her MM drug regimen was held after this hospitalization due to severe cytopenia. Per Dr. NMeda Coffee she will need a repeat 2D echo every 3 months while on this chemo regimen.  I saw her for post hospital follow up on 04/18/16. She was improving but still felt weak. I ordered a TTE in 3 months per Dr. NFrancesca Omanrecommendation. This was performed on 06/27/16 and showed EF down to 25-30% and a highly mobile echodensity attached to the aortic sideof the left coronary leaflet measuring 1 x 0.7 x 0.4 cm. I discussed with Dr. NMeda Coffeewho felt the worsening cardiomyopathy was likely secondary to daratumumab and the patient needed a TEE to further investigate aortic mass.   Daratumumab was restarted on 04/26/2016 and Velcade 1.375mm2 and dexa 2053meekly started on 11/3. Also on Zometa 3.5 mg monthly started on 06/2013 given every 3 months (last 05/03/2016).   I talked to Dr. FenBurr Medico the phone today about this patient. She was recently seen in the office by Dr. FenBurr Medico 06/28/16 and reported a productive cough x2-3 weeks. She was started on Levaquin and her daratumumab was held with plans to restart after the holidays after she had recovered from possible infection. She is currently in a relapse of her MM but had been responding quite well to treatment despite recurrent PNA. Dr. FenBurr Medicoels like she has at least 1 year to live and thought  TEE was appropriate if necessary. She also was willing to hold Daratumumab to see if her EF would improve.   07/24/2016 - this is a very delightful patient who is coming back for follow-up after 1 month. In the last months she feels like she has more energy she denies any lower extremity edema she is no shortness of breath and in  fact she rode her bike here from [facility]. Because of new further decrease of her LVEF from 45-50% to 25-30% she was started on low-dose of lisinopril and continued on Toprol-XL. She has no side effects from these medications. Dr. Burr Medico feels that her decrease in LVEF is secondary to pomalidomide. She denies any chest pain, no orthopnea proximal nocturnal dyspnea no palpitations or syncope.  09/09/2016 -this is 6 weeks follow-up, the patient is feeling very well and is planning trip to [place]. Her most recent echocardiogram showed improvement of LVEF from 25-30% to 35%. She did have a very nice response to the treatment, her M-protein has significantly reduced from 1.2 to 0.2, light chain levels normalized even with half cycle treatment. Dr Burr Medico is changing regiment to DaraVD, and started her on dara every 2 weeks for 2 doses. She denies any palpitations no dizziness or syncope. She continues to exercise and denies any chest pain or shortness of breath she is no lower extremity edema and worse compression stockings. She uses Lasix just as needed only needed to use it once in the last 6 weeks.  12/04/2016 - 3 months follow up, no change in symptoms, the patient remains very active, rides a bike to the clinic and everywhere else.  She denies CP, DOE, but has noticed mild LE edema R>L for about a week, no associated pain. No SOB, no orthopnea or PND.  Past Medical History:  Diagnosis Date  . CAD (coronary artery disease)    a. inf STEMI (in [place] >>> lytics) >>> LHC (8/15- [place]):  pRCA 60%, mid to dist RCA 50% >>> med rx  . Cord compression ([place]) 07/13/12   MRI- diffuse myeloma involvement of T-L spine  . History of radiation therapy 07/13/12-07/27/12   spinal cord compression T3-T10,left scapula  . Hx of echocardiogram    Echo (03/02/14-done at [facility]):  Normal LVEF without significant regional wall motion abnormalities. Mild   . Multiple  myeloma ([place]) 07/01/2012  . OSTEOPOROSIS 06/11/2010   Multiple compression fractures; and spontaneous fracture of sternum Qualifier: Diagnosis of  By: [name], [name]     . Thoracic kyphosis 07/13/12   per MRI scan  . Unspecified deficiency anemia     Past Surgical History:  Procedure Laterality Date  . APPENDECTOMY    . CESAREAN SECTION     x2   . COLONOSCOPY  2007   neg with Dr. [name]  . ELBOW SURGERY    . HIP SURGERY  2009   left  . TUBAL LIGATION      Current Medications: Outpatient Medications Prior to Visit  Medication Sig Dispense Refill  . aspirin EC 81 MG tablet Take 1 tablet (81 mg total) by mouth daily. 90 tablet 3  . atorvastatin (LIPITOR) 40 MG tablet TAKE 1 TABLET (40 MG TOTAL) BY MOUTH DAILY. 90 tablet 2  . dexamethasone (DECADRON) 4 MG tablet Take 5 tab the day before chemo, every 2 weeks 30 tablet 1  . lisinopril (PRINIVIL,ZESTRIL) 2.5 MG tablet Take 1 tablet (2.5 mg total) by mouth daily. 90 tablet 3  .  metoprolol succinate (TOPROL-XL) 25 MG 24 hr tablet TAKE 1 TABLET (25 MG TOTAL) BY MOUTH DAILY. 90 tablet 3  . acetaminophen (TYLENOL) 500 MG tablet Take 500 mg by mouth every 6 (six) hours as needed for moderate pain (pain). Reported on 07/20/2015    . acyclovir (ZOVIRAX) 400 MG tablet Take 1 tablet (400 mg total) by mouth 2 (two) times daily. (Patient not taking: Reported on 12/04/2016) 60 tablet 11  . Bismuth Subsalicylate 878 MG TABS Take 2 tablets every 30 to 60 minutes as needed for up to 8 doses/24 hours (Patient not taking: Reported on 12/04/2016) 80 each 0  . calcium carbonate (OS-CAL) 600 MG TABS tablet Take 600 mg by mouth daily.    . fluticasone (FLONASE) 50 MCG/ACT nasal spray Place 2 sprays into both nostrils daily. (Patient not taking: Reported on 11/13/2016) 16 g 1  . nitroGLYCERIN (NITROSTAT) 0.4 MG SL tablet Place under the tongue as needed. Reported on 07/20/2015    . ondansetron (ZOFRAN) 8 MG tablet Take 1 tablet (8 mg total) by mouth 2 (two) times  daily as needed (Nausea or vomiting). (Patient not taking: Reported on 11/13/2016) 30 tablet 1  . PROAIR HFA 108 (90 BASE) MCG/ACT inhaler Inhale 2 puffs into the lungs every 6 (six) hours as needed.  2  . prochlorperazine (COMPAZINE) 10 MG tablet Take 1 tablet (10 mg total) by mouth every 6 (six) hours as needed (Nausea or vomiting). (Patient not taking: Reported on 09/27/2016) 30 tablet 1  . doxycycline (VIBRA-TABS) 100 MG tablet Take 1 tablet (100 mg total) by mouth 2 (two) times daily. (Patient not taking: Reported on 12/04/2016) 14 tablet 0   Facility-Administered Medications Prior to Visit  Medication Dose Route Frequency Provider Last Rate Last Dose  . 0.9 %  sodium chloride infusion  250 mL Intravenous Once [name], MD      . 0.9 %  sodium chloride infusion  250 mL Intravenous Once [name], MD      . acetaminophen (TYLENOL) tablet 650 mg  650 mg Oral Once [name], MD      . acetaminophen (TYLENOL) tablet 650 mg  650 mg Oral Once [name], MD      . furosemide (LASIX) injection 10 mg  10 mg Intravenous Once [name], MD      . furosemide (LASIX) injection 10 mg  10 mg Intravenous Once [name], MD      . heparin lock flush 100 unit/mL  500 Units Intracatheter Daily PRN [name], MD      . heparin lock flush 100 unit/mL  250 Units Intracatheter PRN [name], MD      . heparin lock flush 100 unit/mL  500 Units Intracatheter Daily PRN [name], MD      . heparin lock flush 100 unit/mL  250 Units Intracatheter PRN [name], MD      . sodium chloride flush (NS) 0.9 % injection 10 mL  10 mL Intracatheter PRN [name], MD      . sodium chloride flush (NS) 0.9 % injection 10 mL  10 mL Intracatheter PRN [name], MD      . sodium chloride flush (NS) 0.9 % injection 10 mL  10 mL Intracatheter PRN [name], MD      . sodium chloride flush (NS) 0.9 % injection 3 mL  3 mL Intracatheter PRN [name], MD      . sodium chloride flush (NS) 0.9 % injection 3 mL  3 mL Intracatheter PRN [name], MD         Allergies:   Zosyn [piperacillin sod-tazobactam so]; Levaquin [levofloxacin in d5w]; Other; Piperacillin-tazobactam in dex; Zithromax [azithromycin]; and Ciprofloxacin   Social History   Social History  . Marital status: Married    Spouse name: Fritz Pickerel  . Number of children: 2  . Years of education: N/A   Occupational History  . Retired-Pyschology Faculty [employer]   Social History Main Topics  . Smoking status: Never Smoker  . Smokeless tobacco: Never Used  . Alcohol use 3.5 oz/week    7 Standard drinks or equivalent per week     Comment: 1 glass of wine a day  . Drug use: No  . Sexual activity: Not on file   Other Topics Concern  . Not on file   Social History Narrative   Health Care POA:    Emergency Contact: spouse, Shealyn Sean [phone]   End of Life Plan:    Who lives with you: spouse, 2 story home   Any pets: Dog, "AKU"   Diet: Patient has a varied diet of protein, vegetables, starches.  Limits red meats.   Exercise: Patient exercises atleast 3 times a week for over an hour.   Seatbelts: Patient reports wearing her seatbelt when in vehicle.    Nancy Fetter Exposure/Protection: Patient reports wearing sunscreen daily.   Hobbies: Basketball, gardening, swimming, reading, biking          Family History:  The patient's family history includes Cancer in her brother; Glaucoma in her mother; Heart disease in her father and mother.     ROS:   Please see the history of present illness.    ROS All other systems reviewed and are negative.  PHYSICAL EXAM:   VS:  BP 124/60   Pulse 84   Ht 5' 6"  (1.676 m)   Wt 124 lb (56.2 kg)   BMI 20.01 kg/m    GEN: Well nourished, well developed, in no acute distress  HEENT: normal  Neck: no JVD, carotid bruits, or masses Cardiac: RRR; no murmurs, rubs, or gallops, mild LE edema R>L Respiratory:  clear to auscultation bilaterally, normal work of breathing GI: soft, nontender, nondistended, + BS MS: no deformity  or atrophy  Skin: warm and dry, no rash Neuro:  Alert and Oriented x 3, Strength and sensation are intact Psych: euthymic mood, full affect  Wt Readings from Last 3 Encounters:  12/04/16 124 lb (56.2 kg)  11/18/16 120 lb 6.4 oz (54.6 kg)  11/13/16 123 lb (55.8 kg)    Studies/Labs Reviewed:   EKG:  EKG is NOT ordered today.   Recent Labs: 03/16/2016: Magnesium 1.7 03/25/2016: B Natriuretic Peptide 188.9 11/13/2016: ALT 25; BUN 19.5; Creatinine 0.9; HGB 10.8; Platelets 185; Potassium 4.0; Sodium 137   Lipid Panel    Component Value Date/Time   CHOL 207 (H) 06/02/2009 2018   TRIG 68 06/02/2009 2018   HDL 73 06/02/2009 2018   CHOLHDL 2.8 Ratio 06/02/2009 2018   VLDL 14 06/02/2009 2018   LDLCALC 120 (H) 06/02/2009 2018    Additional studies/ records that were reviewed today include:  2D ECHO: 03/24/2016 LV EF: 45% -   50% Study Conclusions - Left ventricle: The cavity size was normal. Wall thickness was   normal. Systolic function was mildly reduced. The estimated   ejection fraction was in the range of 45% to 50%. Features are   consistent with a pseudonormal left ventricular filling pattern,  with concomitant abnormal relaxation and increased filling   pressure (grade 2 diastolic dysfunction). - Mitral valve: There was mild regurgitation. - Left atrium: The atrium was mildly dilated.  2D ECHO: 06/27/2016 LV EF: 25% -   30% Study Conclusions - Left ventricle: The cavity size was mildly dilated. Systolic   function was severely reduced. The estimated ejection fraction   was in the range of 25% to 30%. Diffuse hypokinesis. Doppler   parameters are consistent with abnormal left ventricular   relaxation (grade 1 diastolic dysfunction). Doppler parameters   are consistent with intermediate end-diastolic filling pressure. - Aortic valve: Trileaflet; normal thickness leaflets. There was   trivial regurgitation. - Aortic root: The aortic root was normal in size. - Mitral  valve: Structurally normal valve. There was mild   regurgitation. - Right ventricle: The cavity size was normal. Wall thickness was   normal. Systolic function was normal. - Right atrium: The atrium was normal in size. - Tricuspid valve: There was trivial regurgitation. - Pulmonic valve: There was trivial regurgitation. - Pulmonary arteries: Systolic pressure was within the normal   range. - Inferior vena cava: The vessel was dilated. The respirophasic   diameter changes were blunted (< 50%), consistent with elevated   central venous pressure. - Pericardium, extracardiac: There was no pericardial effusion. Impressions: - LVEF is severely decreased estimated at 25-30% with diffuse   hypokinesis.   There is a highly mobile echodensity attached to the aortic side   of the left coronary leaflet measuring 1 x 0.7 x 0.4 cm. The   differential diagnosis includes inferctious endocarditis,   marantic endocarditis, thrombus, papillary fibroelastoma. Further   evaluation with a TEE is recommended.  TTE: 08/2016 Left ventricle: LVEF is approximately 35% with diffuse diffuse   hypokinesis with severe hypokinesis/ akinesis of the   inferior/inferoseptal walls. In comparison to echo images from   December 2017, there does not appear to be significant change The   cavity size was normal. Wall thickness was normal. Doppler   parameters are consistent with abnormal left ventricular   relaxation (grade 1 diastolic dysfunction). - Aortic valve: AV is thickened. 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 Present in echo from December 2017 There was   trivial regurgitation. - Right ventricle: Systolic function was mildly reduced. - Right atrium: The atrium was mildly dilated. - Pericardium, extracardiac: A trivial pericardial effusion was   identified.  TTE: 11/2016\Left ventricle: The cavity size was normal. Systolic function was   mildly  to moderately reduced. The estimated ejection fraction was   in the range of 40% to 45%. Diffuse hypokinesis. Doppler   parameters are consistent with abnormal left ventricular   relaxation (grade 1 diastolic dysfunction). Doppler parameters   are consistent with indeterminate ventricular filling pressure. - Aortic valve: Transvalvular velocity was within the normal range.   There was no stenosis. There was trivial regurgitation. - Left atrium: The atrium was severely dilated. - Right ventricle: The cavity size was normal. Wall thickness was   normal. Systolic function was normal. - Tricuspid valve: There was trivial regurgitation. - Pulmonary arteries: Systolic pressure was within the normal   range. PA peak pressure: 33 mm Hg (S). - Global longitudinal strain -10.5% (abnormal)     ASSESSMENT & PLAN:   KAMIA INSALACO is a 76 y.o. female with a history of multiple myeloma s/p bone marrow transplant (followed at [facility] by Dr. [name] and in [place]  by Dr. Juliann Mule), osteoporosis, HLD and CAD: inferior STEMI s/p thrombolytics (02/2014) who presents to clinic for follow up.   1. Acute on Chronic systolic CHF,dilated cardiomyopathy: EF 45-50% in 03/2016, decreased to 25-30% in December 2017 on velcade and daratuzumab for relapsing multiple myeloma. Previously on Pomalidomide. Dr Burr Medico is changing regiment to DaraVD, and started her on dara every 2 weeks for 2 doses. LVEF improved to 35% in 08/2016, repeat echo in May 2018 improved LVEF to 40-45%. She is asymptomatic and euvolemic. We will repeat echo in 02/2017 followed by a visit in our office.   2. RLE edema - concern for DVT in the settings of existing cancer, LE venous Duplex was negative.   3. Aortic mobile echodensity: attached to the aortic side of the left coronary leaflet measuring 1 x 0.7 x 0.4 cm. At first plan was to perform a TEE but then review of previous echo from [facility] in 2014 showed linear mass on aortic valve that is likely  the same mass that has been stable over time: No need for TEE.  She has no signs of infective endocarditis.  4. Multiple myeloma s/p bone marrow transplant: (followed at [facility] by Dr. [name] and in [facility] by Dr. Burr Medico). Chemo as above.   5. HLD: continue statin  6. CAD: inferior STEMI s/p thrombolytics (02/2014). Plavix recent discontinued 2/2 thrombocytopenia but it was resumed at discharge. Continue plavix, statin and BB   7. Easy bruising - improved with discontinuation of Plavix, start ASA.   Medication Adjustments/Labs and Tests Ordered: Current medicines are reviewed at length with the patient today.  Concerns regarding medicines are outlined above.  Medication changes, Labs and Tests ordered today are listed in the Patient Instructions below. Patient Instructions  Medication Instructions:   Your physician recommends that you continue on your current medications as directed. Please refer to the Current Medication list given to you today.     Testing/Procedures:  Your physician has requested that you have an echocardiogram. Echocardiography is a painless test that uses sound waves to create images of your heart. It provides your doctor with information about the size and shape of your heart and how well your heart's chambers and valves are working. This procedure takes approximately one hour. There are no restrictions for this procedure.  PER DR Sherene Plancarte THIS ECHO NEEDS TO BE SCHEDULED IN AUGUST 2018 FOR THE PATIENT IS ON CHEMO AND WE FOLLOW HER WITH ECHOS EVERY 3 MONTHS   Your physician has requested that you have a lower  extremity venous duplex. This test is an ultrasound of the veins in the legs. It looks at venous blood flow that carries blood from the heart to the legs. Allow one hour for a Lower Venous exam.. There are no restrictions or special instructions.  PER DR Jeremias Broyhill THIS NEEDS TO BE SCHEDULED ASAP--PREFERABLY THIS WEEK     Follow-Up:  September WITH DR  Meda Coffee       If you need a refill on your cardiac medications before your next appointment, please call your pharmacy.      Signed, Ena Dawley, MD  12/06/2016 1:06 AM    [address], [place], [place]  [address] Phone: [phone]; Fax: [phone]

## 2016-12-04 NOTE — Patient Instructions (Signed)
Medication Instructions:   Your physician recommends that you continue on your current medications as directed. Please refer to the Current Medication list given to you today.     Testing/Procedures:  Your physician has requested that you have an echocardiogram. Echocardiography is a painless test that uses sound waves to create images of your heart. It provides your doctor with information about the size and shape of your heart and how well your heart's chambers and valves are working. This procedure takes approximately one hour. There are no restrictions for this procedure.  PER DR NELSON THIS ECHO NEEDS TO BE SCHEDULED IN AUGUST 2018 FOR THE PATIENT IS ON CHEMO AND WE FOLLOW HER WITH ECHOS EVERY 3 MONTHS   Your physician has requested that you have a lower  extremity venous duplex. This test is an ultrasound of the veins in the legs. It looks at venous blood flow that carries blood from the heart to the legs. Allow one hour for a Lower Venous exam.. There are no restrictions or special instructions.  PER DR NELSON THIS NEEDS TO BE SCHEDULED ASAP--PREFERABLY THIS WEEK     Follow-Up:  September WITH DR Meda Coffee       If you need a refill on your cardiac medications before your next appointment, please call your pharmacy.

## 2016-12-05 ENCOUNTER — Telehealth: Payer: Self-pay | Admitting: *Deleted

## 2016-12-05 NOTE — Telephone Encounter (Signed)
Pt aware that per Dr Meda Coffee, her venous US showed no DVT and appears negative for DVT and SVT.  Pt verbalized understanding and pleased with these results.

## 2016-12-05 NOTE — Telephone Encounter (Signed)
-----   Message from [name], MD sent at 12/05/2016  8:22 AM EDT ----- Regarding: FW: Venous Ultrasound  No DVT  ----- Message ----- From: Michelene Heady, RVT Sent: 12/04/2016   2:30 PM To: [name], MD Subject: Venous Ultrasound                              BLE venous ultrasound appears negative for DVT and SVT

## 2016-12-05 NOTE — Progress Notes (Signed)
[facility] PROGRESS NOTE   [name], MD [place] [address]  DIAGNOSIS: Multiple myeloma with relapse  CHIEF COMPLAINS: Follow-up of multiple myeloma  CURRENT TREATMENT:   1. Pomalidomide 4 mg daily on day 1-21, dexamethasone 40 mg on day 1, 8 and 15, every 28 days, started on 02/27/2016, daratumumab weekly started on 03/07/2016, treatment on hold since 03/09/2016 due to hospitalization and severe cytopenia 2. Daratumumab restarted on 04/26/2016, Velcade 1.54m/m2 and dexa 242mweekly start on 11/3, velcade held since 06/28/2016 due to CHF  2. Zometa 3.5 mg monthly started on 06/2013, on every 3 months now  Oncology History   [facility]  Staging form: Multiple Myeloma, AJCC 6th Edition   - Clinical: Stage IIIA - Signed by [name] on 09/04/2013      [facility]  05/26/2012 Initial Diagnosis    Presenting IgG was 4,040 mg/dL on 06/05/2012 (IgA 35; IgM 34); kappa free light chain 34.4 mg/dL, lambda 0.00, kappa:lambda ratio of 34.75; SPEP with M-spike of 2.60.      06/30/2012 Imaging    Numerous lytic lesions throughout the calvarium.  Lytic lesion within the left lateral scapula.  Findings most compatible with metastases or myeloma. Multiple mid and lower thoracic compression fractures.  Slight compression through the endplates at L3.      1260/45/4098one Marrow Biopsy    Bone marrow biopsy showed 49% plasma cell. Normal classical cytogenetics; however, myeloma FISH panel showed 13q- (intermediate risk)          07/14/2012 - 07/27/2012 Radiation Therapy    Palliative radiation 20 Gy over 10 fractions between to thoracic spine cord compression and symptomatic left scapula lytic lesion.      08/10/2012 -  Chemotherapy    Started SQ Velcade once weekly, 3 weeks on, 1 weeks off; daily Revlimid d1-21, 7 days off; and Dexamethasone 4019mO weekly.       02/12/2013 Bone Marrow Transplant     [facility]     06/14/2013 Tumor Marker    IgG  1830,  Kappa:lamba ratio, 1.69 (baseline was 34.75 or   M-spike 0.28 (baseline of 2.6 or  89.3% of baseline).        06/25/2013 -  Chemotherapy    Started zometa 3.5 mg monthly       09/01/2013 -  Chemotherapy    Maintenance therapy with revlimid 5mg70mily for 14 days and then off for 7 (decreased from 21 days on and 7 days off based on neutropenia).       09/13/2013 - 09/17/2014 Hospital Admission    Hospital admission for pneumonia      12/20/2013 Treatment Plan Change    Maintenance therapy decreased to 2.5 mg daily for 21 days and then off for 7 days based on low counts.       01/25/2014 Tumor Marker    IgG 1360 M spike 0.5      03/01/2014 - 03/05/2014 Hospital Ad[facility] received thrombolytic therapy, cath showed 60% prox RCA, mid-distal RCA 50%, Echo normal, no stents.      04/11/2014 - 08/07/2014 Chemotherapy    Continue Revlimid at 2.5 mg 3 weeks on 1 week off      08/08/2014 - 08/13/2014 Hospital Admission    Hospital admission for pneumonia. Her Revlimid was held.  08/29/2014 - 09/14/2014 Chemotherapy    Maintenance Revlimid restarted      09/14/2014 - 09/17/2014 Hospital Admission    Admitted for pneumonia after coming back from a trip in [name]. Revlimid was held again.      11/13/2014 - 12/25/2015 Chemotherapy    Maintenance Revlimid restarted, changed to 2.71m daily, 2 weks on, 1 week off from 12/15/2014, stopped due to disease progression       01/24/2016 Progression    Patient is M protein has gradually increased to 1.0g, repeated a bone marrow biopsy showed plasma cell 10-30%      02/27/2016 -  Chemotherapy    Pomalidomide 4 mg daily on day 1-21, dexamethasone 40 mg on day 1, 8 and 15, every 28 days, started on 02/27/2016, daratumumab weekly started on 03/07/2016, held after first dose, due to hospitalization and a severe  pancytopenia      03/10/2016 - 03/29/2016 Hospital Admission    Pt was admitted for sepsis from pneumonia, and severe pancytopenia. She required ICU stay for a few days due to hypotension, developed b/l pleural effusion required diuretics and b/l thoracentesis. She also required blood and plt transfusion, and prolonged neupogen injection for severe neutropenia. She was discharged home after 3 weeks hospital stay       04/26/2016 -  Chemotherapy    Daratumumab restarted on 04/26/2016, Velcade 1.334mm2 and dexa 2070meekly start on 11/3, velcade held since 06/28/2016 due to CHF       08/26/2016 Echocardiogram    - Left ventricle: LVEF is approximately 35% with diffuse diffuse   hypokinesis with severe hypokinesis/ akinesis of the   inferior/inferoseptal walls. In comparison to echo images from   December 2017, there does not appear to be significant change The   cavity size was normal. Wall thickness was normal. Doppler   parameters are consistent with abnormal left ventricular   relaxation (grade 1 diastolic dysfunction). - Aortic valve: AV is thickened. 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 Present in echo from December 2017 There was   trivial regurgitation. - Right ventricle: Systolic function was mildly reduced. - Right atrium: The atrium was mildly dilated. - Pericardium, extracardiac: A trivial pericardial effusion was   identified.      12/03/2016 Echocardiogram    -Left Ventricle: Systolic function was   mildly to moderately reduced. The estimated ejection fraction was   in the range of 40% to 45%. Diffuse hypokinesis. Doppler   parameters are consistent with abnormal left ventricular   relaxation (grade 1 diastolic dysfunction). Doppler parameters   are consistent with indeterminate ventricular filling pressure. - Left atrium: The atrium was severely dilated. - Tricuspid valve: There was trivial  regurgitation. - Pulmonary arteries: Systolic pressure was within the normal   range. PA peak pressure: 33 mm Hg (S). - Global longitudinal strain -10.5% (abnormal)      INTERVAL HISTORY:  ClaALVARETTA EISENBERGER 67o. female with a past medical history of MM as detailed above, s/p SCT is here for follow-up. She is doing fine. She is recieving an echo every 3 months to monitor her heart. She denies any stomach plans or problems with bowel movements.   MEDICAL HISTORY: Past Medical History:  Diagnosis Date  . CAD (coronary artery disease)    [place] [facility] >>> [facility] [place] [facility] pRCA 60%, mid to dist RCA 50% >>> med rx  .  Cord compression ([place]) 07/13/12   MRI- diffuse myeloma involvement of T-L spine  . History of radiation therapy 07/13/12-07/27/12   spinal cord compression T3-T10,left scapula  . Hx of echocardiogram    Echo (03/02/14-done at [facility]):  Normal LVEF without significant regional wall motion abnormalities. Mild   . Multiple myeloma ([place]) 07/01/2012  . OSTEOPOROSIS 06/11/2010   Multiple compression fractures; and spontaneous fracture of sternum Qualifier: Diagnosis of  By: [name], [name]     . Thoracic kyphosis 07/13/12   per MRI scan  . Unspecified deficiency anemia      ALLERGIES:  is allergic to zosyn [piperacillin sod-tazobactam so]; levaquin [levofloxacin in d5w]; other; piperacillin-tazobactam in dex; zithromax [azithromycin]; and ciprofloxacin.  MEDICATIONS: has a current medication list which includes the following prescription(s): acetaminophen, acyclovir, aspirin ec, atorvastatin, calcium carbonate, dexamethasone, doxycycline, fluticasone, lisinopril, metoprolol succinate, nitroglycerin, ondansetron, proair hfa, and prochlorperazine, and the following Facility-Administered Medications: sodium chloride, sodium chloride, acetaminophen, acetaminophen, furosemide, furosemide, heparin lock flush,  heparin lock flush, heparin lock flush, heparin lock flush, sodium chloride flush, sodium chloride flush, sodium chloride flush, sodium chloride flush, and sodium chloride flush.  SURGICAL HISTORY:  Past Surgical History:  Procedure Laterality Date  . APPENDECTOMY    . CESAREAN SECTION     x2   . COLONOSCOPY  2007   neg with Dr. [name]  . ELBOW SURGERY    . HIP SURGERY  2009   left  . TUBAL LIGATION      REVIEW OF SYSTEMS:  Constitutional: Denies abnormal weight loss Eyes: Denies blurriness of vision Ears, nose, mouth, throat, and face: Denies mucositis or sore throat,  Respiratory: Denies dyspnea or wheezes Cardiovascular: Denies palpitation, chest discomfort  Gastrointestinal:  Denies nausea, heartburn or change in bowel habits Skin: Denies abnormal skin rashes Lymphatics: Denies new lymphadenopathy or easy bruising Neurological:Denies numbness, tingling or new weaknesses Behavioral/Psych: Mood is stable, no new changes   All other systems were reviewed with the patient and are negative.   PHYSICAL EXAMINATION: ECOG PERFORMANCE STATUS: 0  BP 128/65 (BP Location: Left Arm, Patient Position: Sitting)   Pulse 76   Temp 98 F (36.7 C) (Oral)   Resp 18   Ht 5' 6"  (1.676 m)   Wt 124 lb 9.6 oz (56.5 kg)   SpO2 100%   BMI 20.11 kg/m   GENERAL:alert, no distress and comfortable; easily mobile to exam table; kyphosis, moderate.  SKIN: skin color, texture, turgor are normal, no rashes or significant lesions except a small healing, dry wound in the right lower leg above his ankle, with surrounding skin erythema and swelling EYES: normal, Conjunctiva are pink, 1 tear/injectionof blood vessel left eye in sclera. OROPHARYNX:no exudate, no erythema and lips, buccal mucosa, and tongue normal, no ONJ NECK: supple, thyroid normal size, non-tender, without nodularity; nodules cervical bilaterally.  LYMPH:  no palpable lymphadenopathy in the cervical, axillary or supraclavicular LUNGS:  crackles at the bases bilaterally with normal breathing effort, no wheezes or rhonchi HEART: regular rate & rhythm and no murmurs  ABDOMEN:abdomen soft, and normal bowel sounds Musculoskeletal:no cyanosis of digits and no clubbing, NEURO: alert & oriented x 3 with fluent speech, no focal motor/sensory deficits   Labs:  CBC Latest Ref Rng & Units 12/06/2016 11/13/2016 10/23/2016  WBC 3.9 - 10.3 10e3/uL 6.6 4.9 3.5(L)  Hemoglobin 11.6 - 15.9 g/dL 10.1(L) 10.8(L) 10.7(L)  Hematocrit 34.8 - 46.6 % 30.8(L) 31.8(L) 30.9(L)  Platelets 145 - 400 10e3/uL 177 185 150  CMP Latest Ref Rng & Units 11/13/2016 10/23/2016 10/23/2016  Glucose 70 - 140 mg/dl 212(H) 110 -  BUN 7.0 - 26.0 mg/dL 19.5 18.8 -  Creatinine 0.6 - 1.1 mg/dL 0.9 0.8 -  Sodium 136 - 145 mEq/L 137 142 -  Potassium 3.5 - 5.1 mEq/L 4.0 3.8 -  Chloride 101 - 111 mmol/L - - -  CO2 22 - 29 mEq/L 17(L) 24 -  Calcium 8.4 - 10.4 mg/dL 9.2 9.3 -  Total Protein 6.4 - 8.3 g/dL 6.3(L) 5.3(L) 5.6(L)  Total Bilirubin 0.20 - 1.20 mg/dL 0.88 0.89 -  Alkaline Phos 40 - 150 U/L 92 45 -  AST 5 - 34 U/L 27 17 -  ALT 0 - 55 U/L 25 15 -    SPEP M-PROTEIN 10/01/2013: 0.18 (nadir)  10/13/2014: 0.6 11/21/2014: 0.5 01/30/2015: 0.6 03/13/2015: 0.6 04/20/2015: 0.5 06/01/2015: 0.5 07/20/2015: 0.7 09/14/2015: 0.7 10/26/2015: 0.8 12/07/2015: 1.0 02/07/2016: 1.1  04/03/2016: 0.2 05/03/2016: 0.2  05/31/2016: 0.1 06/28/2016: 0.1 07/25/2016: 0.1 08/21/2016: 0.1 09/27/2016: 0.1 10/23/16: 0.1  IgG [phone]) 10/13/2014: 1710 11/21/2014: 1460 01/30/2015: 1380 03/13/15: 1570 04/20/2015: 1550 06/01/2015: 1590 07/22/2015: 1770 09/14/2015: 3546 10/26/2015: 1898 12/07/2015: 2045 02/07/2016: 1883 04/03/2016: 787 05/03/2016: 685  05/31/2016: 544 06/28/2016: 463 07/25/2016: 455 08/21/2016: 426 09/27/2016: 438 10/23/16: 374  Serum kappa, lambda light chains, and ratio  11/21/2014: 3.95, 2.08, 1.90  01/30/2015: 3.92, 2.64, 1.48 03/13/2015: 3.55, 2.17, 1.64 04/20/15: 4.52, 1.78,  2.54 06/01/2015: 4.82, 1.75, 2.75 07/20/2015: 8.71, 2.43, 3.58 09/14/2015: 9.17, 2.02, 4.55 10/26/2015: 11.4, 1.75, 6.42 12/07/2015: 1.54, 1.83, 8.44 02/07/2016: 1.93, 1.42, 13.6 04/03/2016: 0.96, 0.95, 1.01  05/03/2016: 0.71, 0.34, 2.09  05/31/2016: 0.48, 0.26, 1.85 06/28/2016: 0.52, 0.60, 0.87 07/25/2016: 0.46, 0.27, 1.70 08/21/2016: 0.43, 0.27, 1.59 09/27/2016: 0.52, 0.24, 2.17 10/23/16: .0.49, 0.35, 1.40  24 hr urine PEP (total protein, M-spike) 10/13/2014: (-) 02/12/2016: 22m, 677m9/22/2017: 114 mg, (-) 08/08/16: 99 10/23/16: PENDING  PATHOLOGY REPORT   Diagnosis 01/24/2016 Bone Marrow, Aspirate,Biopsy, and Clot, RT iliac and core - VARIABLY CELLULAR BONE MARROW WITH PLASMA CELL NEOPLASM. - SEE COMMENT. PERIPHERAL BLOOD: - MACROCYTIC ANEMIA. Diagnosis Note The bone marrow is variably cellular with trilineage hematopoiesis and non specific myeloid changes. In this background, the plasma cell component is variable ranging from less than 10 % in many areas to 30% in some aspirate particles with relatively low cellularity. Foci of interstitial infiltrates and small clusters are also seen in the clot section. Immunohistochemical stains show kappa light chain restriction in atypical plasma cell clusters and infiltrates consistent with plasma cell neoplasm. Correlation with cytogenetic and FISH studies is recommended. (BNS:gt, 7/27-Jan-2016 Diagnosis 03/28/16 PLEURAL FLUID, LEFT (SPECIMEN 1 OF 1 COLLECTED 03/28/16): REACTIVE MESOTHELIAL CELLS  RADIOGRAPHIC STUDIES:  Echo 12/03/16 -Left Ventricle: Systolic function was   mildly to moderately reduced. The estimated ejection fraction was   in the range of 40% to 45%. Diffuse hypokinesis. Doppler   parameters are consistent with abnormal left ventricular   relaxation (grade 1 diastolic dysfunction). Doppler parameters   are consistent with indeterminate ventricular filling pressure. - Left atrium: The atrium was severely dilated. - Tricuspid  valve: There was trivial regurgitation. - Pulmonary arteries: Systolic pressure was within the normal   range. PA peak pressure: 33 mm Hg (S). - Global longitudinal strain -10.5% (abnormal)  Echo 08/26/2016 - Left ventricle: LVEF is approximately 35% with diffuse diffuse   hypokinesis with severe hypokinesis/ akinesis of the   inferior/inferoseptal walls. In comparison to echo images from   December 2017, there  does not appear to be significant change The   cavity size was normal. Wall thickness was normal. Doppler   parameters are consistent with abnormal left ventricular   relaxation (grade 1 diastolic dysfunction). - Aortic valve: AV is thickened. 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 Present in echo from December 2017 There was   trivial regurgitation. - Right ventricle: Systolic function was mildly reduced. - Right atrium: The atrium was mildly dilated. - Pericardium, extracardiac: A trivial pericardial effusion was   identified.  ASSESSMENT: Shannon Rush 76 y.o. female with a history of Multiple Myeloma s/p autotransplant in August 2014 followed by maintenence low dose Revlimid therapy, and relapsed in 01/2016  PLAN:   1. IgG kappa Multiple myeloma s/p Auto SCT, intermediate risk, relapsed in 01/2016 --MM markers demonstrate a very good initial response (M protein baseline 2.6, down to 0.18 after induction chemo and transplant, stable around 0.5 since July 2015, but has gradually increased lately),  she unfortunately relapsed in July 2017 -I previously discussed her repeated bone marrow biopsy from 02/01/2016, which showed 10-30% plasma cells in her marrow -I agree with Dr. Kendell Bane recommendation to stop revlimid and change to Daratumumab, Pomalidomide and dexamethasone due to her disease relapse. I don't think she has resistance to Revlimid, she has been on very small dose of Revlimid due to her prior history of  multiple infections. -She started first cycle DaraPD in 02/2016, but in 2 weeks she developed severe pancytopenia, pneumonia with sepsis required 3 weeks hospital stay. She did have a very nice response to the treatment, her M-protein has significantly reduced from 1.2 to 0.2, light chain levels normalized even with half cycle treatment  -I recommended her to change regiment to DaraVD, and started her on dara every 2 weeks for 2 doses, she tolerated this very well -she has had good response to treatment, M-protein significantly dropped, will get dara-specific IFE next time to see if she still has M-protein  -Her previous echo in December 2017 showed a significant drop on EF, she is not much symptomatically for CHF, due to the potential contribution of Velcade to her CHF, I'll hold her Velcade for now. Her repeated echo in May 2018 showed a significant improvement, EF 40-45%. -Significant cardiotoxicity including CHF has not been reported in [place], I'll continue, she is on every 2 weeks now, will change to every 4 weeks after next infuion, along with dexamethasone 20 mg the day before infusion  -lab prevsiouly reviewed, mild anemia, stable, will proceed with Dara as scheduled -She hasn't had Velcade since December 2017. Will continue holding it for now due to her CHF -Labs reviewed. Adequate for treatment. She will recieve cycle 16 today (5/25) - She has travel plans that interfere with her June 8th appointment. She will figure out her schedule and call me back.  2. Pancytopenia  -secondary to chemo  - mild anemia stable, neutropenia and some cytopenia result -Continue monitoring closely  3. CHF, CAD, inferior STEMI in 02/2014 -continue metoprolol and lipitor, Dr. Meda Coffee previously started her on lisinopril also -previously followed up with Dr. Meda Coffee  -her previous echo on 08/26/16 showed improvement with EF 30-35% from 25-30%, further improved to 40-45%, echo in May 2018 -Dr. Meda Coffee plan to repeat  her echo every 3 months when she is on treatment  4. Osteoporosis -previous bone density scan showed worsening osteoporosis, continue zometa, changed to every 3 months after 03/2015.  -she declined  repeating DEXA  -she has been on zometa for 4 years, the risk is probably overweight the benefit at this point, we have stopped it    Plan -Reviewed labs, they are adequate for her to continue cycle 16 Dara today. -will check Dara specific SPEP/IFE, I spoke with lab  -Next follow-up and treatment in 2 weeks, although she may reschedule due to her travel plan.   All questions were answered. The patient knows to call the clinic with any problems, questions or concerns. We can certainly see the patient much sooner if necessary.  I spent 20 minutes counseling the patient face to face. The total time spent in the appointment was 25 minutes.   [name]  12/06/2016   This document serves as a record of services personally performed by [name], MD. It was created on her behalf by Brandt Loosen, a trained medical scribe. The creation of this record is based on the scribe's personal observations and the provider's statements to them. This document has been checked and approved by the attending provider.

## 2016-12-06 ENCOUNTER — Other Ambulatory Visit (HOSPITAL_BASED_OUTPATIENT_CLINIC_OR_DEPARTMENT_OTHER): Payer: Medicare Other

## 2016-12-06 ENCOUNTER — Other Ambulatory Visit: Payer: Medicare Other

## 2016-12-06 ENCOUNTER — Other Ambulatory Visit: Payer: Self-pay | Admitting: *Deleted

## 2016-12-06 ENCOUNTER — Ambulatory Visit (HOSPITAL_BASED_OUTPATIENT_CLINIC_OR_DEPARTMENT_OTHER): Payer: Medicare Other | Admitting: Hematology

## 2016-12-06 ENCOUNTER — Encounter: Payer: Self-pay | Admitting: Hematology

## 2016-12-06 ENCOUNTER — Ambulatory Visit (HOSPITAL_BASED_OUTPATIENT_CLINIC_OR_DEPARTMENT_OTHER): Payer: Medicare Other

## 2016-12-06 VITALS — BP 117/65 | HR 64 | Temp 97.8°F | Resp 16

## 2016-12-06 VITALS — BP 128/65 | HR 76 | Temp 98.0°F | Resp 18 | Ht 66.0 in | Wt 124.6 lb

## 2016-12-06 DIAGNOSIS — I252 Old myocardial infarction: Secondary | ICD-10-CM

## 2016-12-06 DIAGNOSIS — T451X5A Adverse effect of antineoplastic and immunosuppressive drugs, initial encounter: Secondary | ICD-10-CM

## 2016-12-06 DIAGNOSIS — D6481 Anemia due to antineoplastic chemotherapy: Secondary | ICD-10-CM

## 2016-12-06 DIAGNOSIS — M818 Other osteoporosis without current pathological fracture: Secondary | ICD-10-CM

## 2016-12-06 DIAGNOSIS — C9000 Multiple myeloma not having achieved remission: Secondary | ICD-10-CM

## 2016-12-06 DIAGNOSIS — C9002 Multiple myeloma in relapse: Secondary | ICD-10-CM

## 2016-12-06 DIAGNOSIS — M810 Age-related osteoporosis without current pathological fracture: Secondary | ICD-10-CM | POA: Diagnosis not present

## 2016-12-06 DIAGNOSIS — D61818 Other pancytopenia: Secondary | ICD-10-CM

## 2016-12-06 DIAGNOSIS — Z5112 Encounter for antineoplastic immunotherapy: Secondary | ICD-10-CM

## 2016-12-06 DIAGNOSIS — I509 Heart failure, unspecified: Secondary | ICD-10-CM

## 2016-12-06 LAB — COMPREHENSIVE METABOLIC PANEL
ALBUMIN: 4.1 g/dL (ref 3.5–5.0)
ALT: 21 U/L (ref 0–55)
ANION GAP: 13 meq/L — AB (ref 3–11)
AST: 24 U/L (ref 5–34)
Alkaline Phosphatase: 54 U/L (ref 40–150)
BUN: 28 mg/dL — AB (ref 7.0–26.0)
CO2: 22 mEq/L (ref 22–29)
Calcium: 9.6 mg/dL (ref 8.4–10.4)
Chloride: 105 mEq/L (ref 98–109)
Creatinine: 0.9 mg/dL (ref 0.6–1.1)
EGFR: 62 mL/min/{1.73_m2} — AB (ref >90)
GLUCOSE: 88 mg/dL (ref 70–140)
Potassium: 3.9 mEq/L (ref 3.5–5.1)
Sodium: 140 mEq/L (ref 136–145)
TOTAL PROTEIN: 6.3 g/dL — AB (ref 6.4–8.3)
Total Bilirubin: 0.54 mg/dL (ref 0.20–1.20)

## 2016-12-06 LAB — CBC WITH DIFFERENTIAL/PLATELET
BASO%: 0.2 % (ref 0.0–2.0)
Basophils Absolute: 0 10*3/uL (ref 0.0–0.1)
EOS ABS: 0 10*3/uL (ref 0.0–0.5)
EOS%: 0.3 % (ref 0.0–7.0)
HCT: 30.8 % — ABNORMAL LOW (ref 34.8–46.6)
HEMOGLOBIN: 10.1 g/dL — AB (ref 11.6–15.9)
LYMPH%: 10.2 % — ABNORMAL LOW (ref 14.0–49.7)
MCH: 35.6 pg — ABNORMAL HIGH (ref 25.1–34.0)
MCHC: 32.8 g/dL (ref 31.5–36.0)
MCV: 108.5 fL — AB (ref 79.5–101.0)
MONO#: 0.7 10*3/uL (ref 0.1–0.9)
MONO%: 10.1 % (ref 0.0–14.0)
NEUT%: 79.2 % — ABNORMAL HIGH (ref 38.4–76.8)
NEUTROS ABS: 5.2 10*3/uL (ref 1.5–6.5)
PLATELETS: 177 10*3/uL (ref 145–400)
RBC: 2.84 10*6/uL — ABNORMAL LOW (ref 3.70–5.45)
RDW: 15.6 % — AB (ref 11.2–14.5)
WBC: 6.6 10*3/uL (ref 3.9–10.3)
lymph#: 0.7 10*3/uL — ABNORMAL LOW (ref 0.9–3.3)

## 2016-12-06 MED ORDER — METHYLPREDNISOLONE SODIUM SUCC 125 MG IJ SOLR
125.0000 mg | Freq: Once | INTRAMUSCULAR | Status: AC
  Administered 2016-12-06: 125 mg via INTRAVENOUS

## 2016-12-06 MED ORDER — PROCHLORPERAZINE MALEATE 10 MG PO TABS
10.0000 mg | ORAL_TABLET | Freq: Once | ORAL | Status: AC
  Administered 2016-12-06: 10 mg via ORAL

## 2016-12-06 MED ORDER — ACETAMINOPHEN 325 MG PO TABS
650.0000 mg | ORAL_TABLET | Freq: Once | ORAL | Status: AC
  Administered 2016-12-06: 650 mg via ORAL

## 2016-12-06 MED ORDER — METHYLPREDNISOLONE SODIUM SUCC 125 MG IJ SOLR
INTRAMUSCULAR | Status: AC
  Filled 2016-12-06: qty 2

## 2016-12-06 MED ORDER — SODIUM CHLORIDE 0.9 % IV SOLN
16.0000 mg/kg | Freq: Once | INTRAVENOUS | Status: AC
  Administered 2016-12-06: 900 mg via INTRAVENOUS
  Filled 2016-12-06: qty 5

## 2016-12-06 MED ORDER — ACETAMINOPHEN 325 MG PO TABS
ORAL_TABLET | ORAL | Status: AC
  Filled 2016-12-06: qty 2

## 2016-12-06 MED ORDER — SODIUM CHLORIDE 0.9 % IV SOLN
Freq: Once | INTRAVENOUS | Status: AC
  Administered 2016-12-06: 10:00:00 via INTRAVENOUS

## 2016-12-06 MED ORDER — DIPHENHYDRAMINE HCL 25 MG PO CAPS
ORAL_CAPSULE | ORAL | Status: AC
  Filled 2016-12-06: qty 2

## 2016-12-06 MED ORDER — DIPHENHYDRAMINE HCL 25 MG PO CAPS
50.0000 mg | ORAL_CAPSULE | Freq: Once | ORAL | Status: AC
  Administered 2016-12-06: 50 mg via ORAL

## 2016-12-06 MED ORDER — MONTELUKAST SODIUM 10 MG PO TABS
10.0000 mg | ORAL_TABLET | Freq: Once | ORAL | Status: AC
  Administered 2016-12-06: 10 mg via ORAL

## 2016-12-06 MED ORDER — MONTELUKAST SODIUM 10 MG PO TABS
ORAL_TABLET | ORAL | Status: AC
  Filled 2016-12-06: qty 1

## 2016-12-06 MED ORDER — PROCHLORPERAZINE MALEATE 10 MG PO TABS
ORAL_TABLET | ORAL | Status: AC
  Filled 2016-12-06: qty 1

## 2016-12-06 NOTE — Patient Instructions (Signed)
[facility] Discharge Instructions for Patients Receiving Chemotherapy  Today you received the following chemotherapy agents Darzalex.  To help prevent nausea and vomiting after your treatment, we encourage you to take your nausea medication as directed.  If you develop nausea and vomiting that is not controlled by your nausea medication, call the clinic.   BELOW ARE SYMPTOMS THAT SHOULD BE REPORTED IMMEDIATELY:  *FEVER GREATER THAN 100.5 F  *CHILLS WITH OR WITHOUT FEVER  NAUSEA AND VOMITING THAT IS NOT CONTROLLED WITH YOUR NAUSEA MEDICATION  *UNUSUAL SHORTNESS OF BREATH  *UNUSUAL BRUISING OR BLEEDING  TENDERNESS IN MOUTH AND THROAT WITH OR WITHOUT PRESENCE OF ULCERS  *URINARY PROBLEMS  *BOWEL PROBLEMS  UNUSUAL RASH Items with * indicate a potential emergency and should be followed up as soon as possible.  Feel free to call the clinic you have any questions or concerns. The clinic phone number is [phone].  Please show the CHEMO ALERT CARD at check-in to the Emergency Department and triage nurse.    

## 2016-12-06 NOTE — Addendum Note (Signed)
Addended by: [name] on: 12/06/2016 09:49 AM   Modules accepted: Orders

## 2016-12-10 ENCOUNTER — Telehealth: Payer: Self-pay | Admitting: *Deleted

## 2016-12-10 LAB — PROTEIN ELECTROPHORESIS, SERUM
A/G Ratio: 1.7 (ref 0.7–1.7)
Albumin: 3.8 g/dL (ref 2.9–4.4)
Alpha 1: 0.3 g/dL (ref 0.0–0.4)
Alpha 2: 0.7 g/dL (ref 0.4–1.0)
BETA: 0.8 g/dL (ref 0.7–1.3)
GLOBULIN, TOTAL: 2.2 g/dL (ref 2.2–3.9)
Gamma Globulin: 0.4 g/dL (ref 0.4–1.8)
M-Spike, %: 0.1 g/dL — ABNORMAL HIGH
Total Protein: 6 g/dL (ref 6.0–8.5)

## 2016-12-10 LAB — IFE, DARA-SPECIFIC, SERUM
IgA, Qn, Serum: 7 mg/dL — ABNORMAL LOW (ref 64–422)
IgG, Qn, Serum: 370 mg/dL — ABNORMAL LOW (ref 700–1600)
IgM, Qn, Serum: 18 mg/dL — ABNORMAL LOW (ref 26–217)

## 2016-12-10 LAB — KAPPA/LAMBDA LIGHT CHAINS
Ig Kappa Free Light Chain: 6.8 mg/L (ref 3.3–19.4)
Ig Lambda Free Light Chain: 2.9 mg/L — ABNORMAL LOW (ref 5.7–26.3)
KAPPA/LAMBDA FLC RATIO: 2.34 — AB (ref 0.26–1.65)

## 2016-12-10 NOTE — Telephone Encounter (Signed)
Received vm call from pt stating that she needs to move her scheduled appt /8/18 to 12/27/16 due to being out of town.  OK with Dr Burr Medico.  Message to scheduler.

## 2016-12-11 ENCOUNTER — Telehealth: Payer: Self-pay | Admitting: Hematology

## 2016-12-11 NOTE — Telephone Encounter (Signed)
lvm to inform pt of 6/15 appts per sch msg

## 2016-12-24 NOTE — Progress Notes (Signed)
[facility] OFF[facility] PROGRESS NOTE   Shannon Resides, MD [place] [address]  DIAGNOSIS: Multiple myeloma with relapse  CHIEF COMPLAINS: Follow-up of multiple myeloma  CURRENT TREATMENT:   1. Pomalidomide 4 mg daily on day 1-21, dexamethasone 40 mg on day 1, 8 and 15, every 28 days, started on 02/27/2016, daratumumab weekly started on 03/07/2016, treatment on hold since 03/09/2016 due to hospitalization and severe cytopenia 2. Daratumumab restarted on 04/26/2016, Velcade 1.7m/m2 and dexa 22mweekly start on 11/3, velcade held since 06/28/2016 due to CHF  2. Zometa 3.5 mg monthly started on 06/2013, on every 3 months now  Oncology History   [facility] ([facility]  Staging form: Multiple Myeloma, AJCC 6th Edition   - Clinical: Stage IIIA - Signed by [name] on 09/04/2013      Multiple myeloma ([facility]  05/26/2012 Initial Diagnosis    Presenting IgG was 4,040 mg/dL on 06/05/2012 (IgA 35; IgM 34); kappa free light chain 34.4 mg/dL, lambda 0.00, kappa:lambda ratio of 34.75; SPEP with M-spike of 2.60.      06/30/2012 Imaging    Numerous lytic lesions throughout the calvarium.  Lytic lesion within the left lateral scapula.  Findings most compatible with metastases or myeloma. Multiple mid and lower thoracic compression fractures.  Slight compression through the endplates at L3.      1250/38/8828one Marrow Biopsy    Bone marrow biopsy showed 49% plasma cell. Normal classical cytogenetics; however, myeloma FISH panel showed 13q- (intermediate risk)          07/14/2012 - 07/27/2012 Radiation Therapy    Palliative radiation 20 Gy over 10 fractions between to thoracic spine cord compression and symptomatic left scapula lytic lesion.      08/10/2012 -  Chemotherapy    Started SQ Velcade once weekly, 3 weeks on, 1 weeks off; daily Revlimid d1-21, 7 days off; and Dexamethasone 4039mO weekly.       02/12/2013 Bone Marrow Transplant     [facility]     06/14/2013 Tumor Marker    IgG  1830,  Kappa:lamba ratio, 1.69 (baseline was 34.75 or   M-spike 0.28 (baseline of 2.6 or  89.3% of baseline).        06/25/2013 -  Chemotherapy    Started zometa 3.5 mg monthly       09/01/2013 -  Chemotherapy    Maintenance therapy with revlimid 5mg11mily for 14 days and then off for 7 (decreased from 21 days on and 7 days off based on neutropenia).       09/13/2013 - 09/17/2014 Hospital Admission    Hospital admission for pneumonia      12/20/2013 Treatment Plan Change    Maintenance therapy decreased to 2.5 mg daily for 21 days and then off for 7 days based on low counts.       01/25/2014 Tumor Marker    IgG 1360 M spike 0.5      03/01/2014 - 03/05/2014 Hospital [facility] received thromb[facility] therapy, cath showed 60% prox RCA, mid-distal RCA 50%, Echo normal, no stents.      04/11/2014 - 08/07/2014 Chemotherapy    Continue Revlimid at 2.5 mg 3 weeks on 1 week off      08/08/2014 - 08/13/2014 Hospital Admission    Hospital admission for pneumonia. Her Revlimid was held.  08/29/2014 - 09/14/2014 Chemotherapy    Maintenance Revlimid restarted      09/14/2014 - 09/17/2014 Hospital Admission    Admitted for pneumonia after coming back from a trip in [name]. Revlimid was held again.      11/13/2014 - 12/25/2015 Chemotherapy    Maintenance Revlimid restarted, changed to 2.73m daily, 2 weks on, 1 week off from 12/15/2014, stopped due to disease progression       01/24/2016 Progression    Patient is M protein has gradually increased to 1.0g, repeated a bone marrow biopsy showed plasma cell 10-30%      02/27/2016 -  Chemotherapy    Pomalidomide 4 mg daily on day 1-21, dexamethasone 40 mg on day 1, 8 and 15, every 28 days, started on 02/27/2016, daratumumab weekly started on 03/07/2016, held after first dose, due to hospitalization and a severe  pancytopenia      03/10/2016 - 03/29/2016 Hospital Admission    Pt was admitted for sepsis from pneumonia, and severe pancytopenia. She required ICU stay for a few days due to hypotension, developed b/l pleural effusion required diuretics and b/l thoracentesis. She also required blood and plt transfusion, and prolonged neupogen injection for severe neutropenia. She was discharged home after 3 weeks hospital stay       04/26/2016 -  Chemotherapy    Daratumumab restarted on 04/26/2016, Velcade 1.341mm2 and dexa 2036meekly start on 11/3, velcade held since 06/28/2016 due to CHF       08/26/2016 Echocardiogram    - Left ventricle: LVEF is approximately 35% with diffuse diffuse   hypokinesis with severe hypokinesis/ akinesis of the   inferior/inferoseptal walls. In comparison to echo images from   December 2017, there does not appear to be significant change The   cavity size was normal. Wall thickness was normal. Doppler   parameters are consistent with abnormal left ventricular   relaxation (grade 1 diastolic dysfunction). - Aortic valve: AV is thickened. 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 Present in echo from December 2017 There was   trivial regurgitation. - Right ventricle: Systolic function was mildly reduced. - Right atrium: The atrium was mildly dilated. - Pericardium, extracardiac: A trivial pericardial effusion was   identified.      12/03/2016 Echocardiogram    -Left Ventricle: Systolic function was   mildly to moderately reduced. The estimated ejection fraction was   in the range of 40% to 45%. Diffuse hypokinesis. Doppler   parameters are consistent with abnormal left ventricular   relaxation (grade 1 diastolic dysfunction). Doppler parameters   are consistent with indeterminate ventricular filling pressure. - Left atrium: The atrium was severely dilated. - Tricuspid valve: There was trivial  regurgitation. - Pulmonary arteries: Systolic pressure was within the normal   range. PA peak pressure: 33 mm Hg (S). - Global longitudinal strain -10.5% (abnormal)      INTERVAL HISTORY:  Shannon Rush 76o. female with a past medical history of MM as detailed above, s/p SCT is here for follow-up.She has been doing well. She denies any concerns or refills needed. Her right leg is slightly swollen compared to her left one but denies any associated problems. denies any cough. She see her cardiologist every 3 months.  She plans to go to [place] end of July    MEDICAL HISTORY: Past Medical History:  Diagnosis Date  . CAD (coronary artery disease)    a. inf STEMI (in  [place] >>> [facility]) >>> LHC ([facility]):  pRCA 60%, mid to dist RCA 50% >>> med rx  . Cord compression ([place]) 07/13/12   MRI- diffuse myeloma involvement of T-L spine  . History of radiation therapy 07/13/12-07/27/12   spinal cord compression T3-T10,left scapula  . Hx of echocardiogram    Echo (03/02/14-done at [facility]):  Normal LVEF without significant regional wall motion abnormalities. Mild   . Multiple myeloma ([place]) 07/01/2012  . OSTEOPOROSIS 06/11/2010   Multiple compression fractures; and spontaneous fracture of sternum Qualifier: Diagnosis of  By: [name], [name]     . Thoracic kyphosis 07/13/12   per MRI scan  . Unspecified deficiency anemia      ALLERGIES:  is allergic to zosyn [piperacillin sod-tazobactam so]; levaquin [levofloxacin in d5w]; other; piperacillin-tazobactam in dex; zithromax [azithromycin]; and ciprofloxacin.   SURGICAL HISTORY:  Past Surgical History:  Procedure Laterality Date  . APPENDECTOMY    . CESAREAN SECTION     x2   . COLONOSCOPY  2007   neg with Dr. [name]  . ELBOW SURGERY    . HIP SURGERY  2009   left  . TUBAL LIGATION      REVIEW OF SYSTEMS:  Constitutional: Denies abnormal weight loss Eyes: Denies blurriness of  vision Ears, nose, mouth, throat, and face: Denies mucositis or sore throat,  Respiratory: Denies dyspnea or wheezes Cardiovascular: Denies palpitation, chest discomfort (+)right leg swelling Gastrointestinal:  Denies nausea, heartburn or change in bowel habits Skin: Denies abnormal skin rashes Lymphatics: Denies new lymphadenopathy or easy bruising Neurological:Denies numbness, tingling or new weaknesses Behavioral/Psych: Mood is stable, no new changes   All other systems were reviewed with the patient and are negative.   PHYSICAL EXAMINATION:  ECOG PERFORMANCE STATUS: 0 BP (!) 91/54 (BP Location: Left Arm, Patient Position: Sitting) Comment: nurse aware  Pulse 75   Temp 97.7 F (36.5 C) (Oral)   Resp 18   Ht 5' 6"  (1.676 m)   Wt 124 lb 6.4 oz (56.4 kg)   SpO2 100%   BMI 20.08 kg/m  GENERAL:alert, no distress and comfortable; easily mobile to exam table; kyphosis, moderate.  SKIN: skin color, texture, turgor are normal, no rashes or significant lesions except a small healing, dry wound in the right lower leg above his ankle, with surrounding skin erythema and swelling EYES: normal, Conjunctiva are pink, 1 tear/injectionof blood vessel left eye in sclera. OROPHARYNX:no exudate, no erythema and lips, buccal mucosa, and tongue normal, no ONJ NECK: supple, thyroid normal size, non-tender, without nodularity; nodules cervical bilaterally.  LYMPH:  no palpable lymphadenopathy in the cervical, axillary or supraclavicular LUNGS: crackles at the bases bilaterally with normal breathing effort, no wheezes or rhonchi HEART: regular rate & rhythm and no murmurs  ABDOMEN:abdomen soft, and normal bowel sounds Musculoskeletal:no cyanosis of digits and no clubbing, NEURO: alert & oriented x 3 with fluent speech, no focal motor/sensory deficits   Labs:  CBC Latest Ref Rng & Units 12/27/2016 12/06/2016 11/13/2016  WBC 3.9 - 10.3 10e3/uL 4.0 6.6 4.9  Hemoglobin 11.6 - 15.9 g/dL 10.6(L) 10.1(L)  10.8(L)  Hematocrit 34.8 - 46.6 % 30.8(L) 30.8(L) 31.8(L)  Platelets 145 - 400 10e3/uL 171 177 185    CMP Latest Ref Rng & Units 12/06/2016 12/06/2016 11/13/2016  Glucose 70 - 140 mg/dl 88 - 212(H)  BUN 7.0 - 26.0 mg/dL 28.0(H) - 19.5  Creatinine 0.6 - 1.1 mg/dL 0.9 - 0.9  Sodium 136 - 145  mEq/L 140 - 137  Potassium 3.5 - 5.1 mEq/L 3.9 - 4.0  Chloride 101 - 111 mmol/L - - -  CO2 22 - 29 mEq/L 22 - 17(L)  Calcium 8.4 - 10.4 mg/dL 9.6 - 9.2  Total Protein 6.0 - 8.5 g/dL 6.0 6.3(L) 6.3(L)  Total Bilirubin 0.20 - 1.20 mg/dL 0.54 - 0.88  Alkaline Phos 40 - 150 U/L 54 - 92  AST 5 - 34 U/L 24 - 27  ALT 0 - 55 U/L 21 - 25    SPEP M-PROTEIN 10/01/2013: 0.18 (nadir)  10/13/2014: 0.6 11/21/2014: 0.5 01/30/2015: 0.6 03/13/2015: 0.6 04/20/2015: 0.5 06/01/2015: 0.5 07/20/2015: 0.7 09/14/2015: 0.7 10/26/2015: 0.8 12/07/2015: 1.0 02/07/2016: 1.1  04/03/2016: 0.2 05/03/2016: 0.2  05/31/2016: 0.1 06/28/2016: 0.1 07/25/2016: 0.1 08/21/2016: 0.1 09/27/2016: 0.1 10/23/16: 0.1  IgG ([id number]) 10/13/2014: 1710 11/21/2014: 1460 01/30/2015: 1380 03/13/15: 1570 04/20/2015: 1550 06/01/2015: 1590 07/22/2015: 1770 09/14/2015: 1787 10/26/2015: 1898 12/07/2015: 2045 02/07/2016: 1883 04/03/2016: 787 05/03/2016: 685  05/31/2016: 544 06/28/2016: 463 07/25/2016: 455 08/21/2016: 426 09/27/2016: 438 10/23/16: 374  Serum kappa, lambda light chains, and ratio  11/21/2014: 3.95, 2.08, 1.90  01/30/2015: 3.92, 2.64, 1.48 03/13/2015: 3.55, 2.17, 1.64 04/20/15: 4.52, 1.78, 2.54 06/01/2015: 4.82, 1.75, 2.75 07/20/2015: 8.71, 2.43, 3.58 09/14/2015: 9.17, 2.02, 4.55 10/26/2015: 11.4, 1.75, 6.42 12/07/2015: 1.54, 1.83, 8.44 02/07/2016: 1.93, 1.42, 13.6 04/03/2016: 0.96, 0.95, 1.01  05/03/2016: 0.71, 0.34, 2.09  05/31/2016: 0.48, 0.26, 1.85 06/28/2016: 0.52, 0.60, 0.87 07/25/2016: 0.46, 0.27, 1.70 08/21/2016: 0.43, 0.27, 1.59 09/27/2016: 0.52, 0.24, 2.17 10/23/16: .0.49, 0.35, 1.40  24 hr urine PEP (total protein, M-spike) 10/13/2014:  (-) 02/12/2016: 240m, 626m9/22/2017: 114 mg, (-) 08/08/16: 99 10/23/16: PENDING  PATHOLOGY REPORT   Diagnosis 01/24/2016 Bone Marrow, Aspirate,Biopsy, and Clot, RT iliac and core - VARIABLY CELLULAR BONE MARROW WITH PLASMA CELL NEOPLASM. - SEE COMMENT. PERIPHERAL BLOOD: - MACROCYTIC ANEMIA. Diagnosis Note The bone marrow is variably cellular with trilineage hematopoiesis and non specific myeloid changes. In this background, the plasma cell component is variable ranging from less than 10 % in many areas to 30% in some aspirate particles with relatively low cellularity. Foci of interstitial infiltrates and small clusters are also seen in the clot section. Immunohistochemical stains show kappa light chain restriction in atypical plasma cell clusters and infiltrates consistent with plasma cell neoplasm. Correlation with cytogenetic and FISH studies is recommended. (BNS:gt, 7/07-28-17 Diagnosis 03/28/16 PLEURAL FLUID, LEFT (SPECIMEN 1 OF 1 COLLECTED 03/28/16): REACTIVE MESOTHELIAL CELLS  RADIOGRAPHIC STUDIES:  Echo 12/03/16 -Left Ventricle: Systolic function was   mildly to moderately reduced. The estimated ejection fraction was   in the range of 40% to 45%. Diffuse hypokinesis. Doppler   parameters are consistent with abnormal left ventricular   relaxation (grade 1 diastolic dysfunction). Doppler parameters   are consistent with indeterminate ventricular filling pressure. - Left atrium: The atrium was severely dilated. - Tricuspid valve: There was trivial regurgitation. - Pulmonary arteries: Systolic pressure was within the normal   range. PA peak pressure: 33 mm Hg (S). - Global longitudinal strain -10.5% (abnormal)  Echo 08/26/2016 - Left ventricle: LVEF is approximately 35% with diffuse diffuse   hypokinesis with severe hypokinesis/ akinesis of the   inferior/inferoseptal walls. In comparison to echo images from   December 2017, there does not appear to be significant change The    cavity size was normal. Wall thickness was normal. Doppler   parameters are consistent with abnormal left ventricular   relaxation (grade 1 diastolic dysfunction). - Aortic valve: AV is thickened.  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 Present in echo from December 2017 There was   trivial regurgitation. - Right ventricle: Systolic function was mildly reduced. - Right atrium: The atrium was mildly dilated. - Pericardium, extracardiac: A trivial pericardial effusion was   identified.  ASSESSMENT: Shannon Rush 76 y.o. female with a history of Multiple Myeloma s/p autotransplant in August 2014 followed by maintenence low dose Revlimid therapy, and relapsed in 01/2016  PLAN:   1. IgG kappa Multiple myeloma s/p Auto SCT, intermediate risk, relapsed in 01/2016 --MM markers demonstrate a very good initial response (M protein baseline 2.6, down to 0.18 after induction chemo and transplant, stable around 0.5 since July 2015, but has gradually increased lately),  she unfortunately relapsed in July 2017 -I previously discussed her repeated bone marrow biopsy from 02/01/2016, which showed 10-30% plasma cells in her marrow -I agree with Dr. Kendell Bane recommendation to stop revlimid and change to Daratumumab, Pomalidomide and dexamethasone due to her disease relapse. I don't think she has resistance to Revlimid, she has been on very small dose of Revlimid due to her prior history of multiple infections. -She started first cycle DaraPD in 02/2016, but in 2 weeks she developed severe pancytopenia, pneumonia with sepsis required 3 weeks hospital stay. She did have a very nice response to the treatment, her M-protein has significantly reduced from 1.2 to 0.2, light chain levels normalized even with half cycle treatment  -I recommended her to change regiment to DaraVD, and started her on dara every 2 weeks for 2 doses, she tolerated this very  well -she has had good response to treatment, M-protein significantly dropped, will get dara-specific IFE next time to see if she still has M-protein  -Her previous echo in December 2017 showed a significant drop on EF, she is not much symptomatically for CHF, due to the potential contribution of Velcade to her CHF, I'll hold her Velcade for now. Her repeated echo in May 2018 showed a significant improvement, EF 40-45%. -She is clinically doing very well, and has had good response to treatment. We'll continue Dara and dexa 30m every 4 weeks. -I ordered dara-specific IFE to see if she has achieved complete response, she has low level M-protein, possible related to dara  -She hasn't had Velcade since December 2017. Will continue holding it for now due to her CHF -Labs reviewed. Adequate for treatment. She has been tolerating treatment well. She will recieve cycle 17 today (6/15)  2. Pancytopenia  -secondary to chemo  - mild anemia stable, neutropenia and some cytopenia result -Continue monitoring closely  3. CHF, CAD, inferior STEMI in 02/2014 -continue metoprolol and lipitor, Dr. NMeda Coffeepreviously started her on lisinopril also -previously followed up with Dr. NMeda Coffee -her previous echo on 08/26/16 showed improvement with EF 30-35% from 25-30%, further improved to 40-45%, echo in May 2018 -Dr. NMeda Coffeeplan to repeat her echo every 3 months when she is on treatment  4. Osteoporosis -previous bone density scan showed worsening osteoporosis, continue zometa, changed to every 3 months after 03/2015.  -she declined repeating DEXA  -she has been on zometa for 4 years, the risk is probably overweight the benefit at this point, we have stopped it    Plan -Reviewed labs, they are adequate for her to continue cycle 17 Dara today. She take dexa 248mthe day before dara  -Next follow-up and treatment in 4 weeks, although she may reschedule due to  her travel plan.   All questions were answered. The  patient knows to call the clinic with any problems, questions or concerns. We can certainly see the patient much sooner if necessary.  I spent 20 minutes counseling the patient face to face. The total time spent in the appointment was 25 minutes.   Levada Schilling  12/27/2016   This document serves as a record of services personally performed by [name], MD. It was created on her behalf by Brandt Loosen, a trained medical scribe. The creation of this record is based on the scribe's personal observations and the provider's statements to them. This document has been checked and approved by the attending provider.

## 2016-12-27 ENCOUNTER — Ambulatory Visit (HOSPITAL_BASED_OUTPATIENT_CLINIC_OR_DEPARTMENT_OTHER): Payer: Medicare Other

## 2016-12-27 ENCOUNTER — Ambulatory Visit (HOSPITAL_BASED_OUTPATIENT_CLINIC_OR_DEPARTMENT_OTHER): Payer: Medicare Other | Admitting: Hematology

## 2016-12-27 ENCOUNTER — Other Ambulatory Visit (HOSPITAL_BASED_OUTPATIENT_CLINIC_OR_DEPARTMENT_OTHER): Payer: Medicare Other

## 2016-12-27 VITALS — BP 91/54 | HR 75 | Temp 97.7°F | Resp 18 | Ht 66.0 in | Wt 124.4 lb

## 2016-12-27 VITALS — BP 102/56 | HR 70 | Temp 98.0°F | Resp 18

## 2016-12-27 DIAGNOSIS — I252 Old myocardial infarction: Secondary | ICD-10-CM | POA: Diagnosis not present

## 2016-12-27 DIAGNOSIS — C9000 Multiple myeloma not having achieved remission: Secondary | ICD-10-CM

## 2016-12-27 DIAGNOSIS — M818 Other osteoporosis without current pathological fracture: Secondary | ICD-10-CM

## 2016-12-27 DIAGNOSIS — C9002 Multiple myeloma in relapse: Secondary | ICD-10-CM

## 2016-12-27 DIAGNOSIS — D6481 Anemia due to antineoplastic chemotherapy: Secondary | ICD-10-CM

## 2016-12-27 DIAGNOSIS — M810 Age-related osteoporosis without current pathological fracture: Secondary | ICD-10-CM | POA: Diagnosis not present

## 2016-12-27 DIAGNOSIS — T451X5A Adverse effect of antineoplastic and immunosuppressive drugs, initial encounter: Secondary | ICD-10-CM

## 2016-12-27 DIAGNOSIS — Z5112 Encounter for antineoplastic immunotherapy: Secondary | ICD-10-CM | POA: Diagnosis not present

## 2016-12-27 DIAGNOSIS — I509 Heart failure, unspecified: Secondary | ICD-10-CM

## 2016-12-27 DIAGNOSIS — D61818 Other pancytopenia: Secondary | ICD-10-CM | POA: Diagnosis not present

## 2016-12-27 DIAGNOSIS — D61810 Antineoplastic chemotherapy induced pancytopenia: Secondary | ICD-10-CM

## 2016-12-27 LAB — COMPREHENSIVE METABOLIC PANEL
ALBUMIN: 3.8 g/dL (ref 3.5–5.0)
ALK PHOS: 49 U/L (ref 40–150)
ALT: 14 U/L (ref 0–55)
AST: 18 U/L (ref 5–34)
Anion Gap: 13 mEq/L — ABNORMAL HIGH (ref 3–11)
BILIRUBIN TOTAL: 0.77 mg/dL (ref 0.20–1.20)
BUN: 25.3 mg/dL (ref 7.0–26.0)
CO2: 20 meq/L — AB (ref 22–29)
CREATININE: 1.1 mg/dL (ref 0.6–1.1)
Calcium: 9.5 mg/dL (ref 8.4–10.4)
Chloride: 106 mEq/L (ref 98–109)
EGFR: 52 mL/min/{1.73_m2} — AB (ref >90)
GLUCOSE: 203 mg/dL — AB (ref 70–140)
Potassium: 4.2 mEq/L (ref 3.5–5.1)
Sodium: 139 mEq/L (ref 136–145)
TOTAL PROTEIN: 6 g/dL — AB (ref 6.4–8.3)

## 2016-12-27 LAB — CBC WITH DIFFERENTIAL/PLATELET
BASO%: 0.4 % (ref 0.0–2.0)
Basophils Absolute: 0 10*3/uL (ref 0.0–0.1)
EOS%: 0 % (ref 0.0–7.0)
Eosinophils Absolute: 0 10*3/uL (ref 0.0–0.5)
HCT: 30.8 % — ABNORMAL LOW (ref 34.8–46.6)
HGB: 10.6 g/dL — ABNORMAL LOW (ref 11.6–15.9)
LYMPH%: 4.8 % — AB (ref 14.0–49.7)
MCH: 36.9 pg — ABNORMAL HIGH (ref 25.1–34.0)
MCHC: 34.6 g/dL (ref 31.5–36.0)
MCV: 106.9 fL — ABNORMAL HIGH (ref 79.5–101.0)
MONO#: 0.1 10*3/uL (ref 0.1–0.9)
MONO%: 1.8 % (ref 0.0–14.0)
NEUT%: 93 % — ABNORMAL HIGH (ref 38.4–76.8)
NEUTROS ABS: 3.7 10*3/uL (ref 1.5–6.5)
PLATELETS: 171 10*3/uL (ref 145–400)
RBC: 2.88 10*6/uL — AB (ref 3.70–5.45)
RDW: 15.4 % — ABNORMAL HIGH (ref 11.2–14.5)
WBC: 4 10*3/uL (ref 3.9–10.3)
lymph#: 0.2 10*3/uL — ABNORMAL LOW (ref 0.9–3.3)

## 2016-12-27 MED ORDER — METHYLPREDNISOLONE SODIUM SUCC 125 MG IJ SOLR
125.0000 mg | Freq: Once | INTRAMUSCULAR | Status: AC
  Administered 2016-12-27: 125 mg via INTRAVENOUS

## 2016-12-27 MED ORDER — MONTELUKAST SODIUM 10 MG PO TABS
10.0000 mg | ORAL_TABLET | Freq: Once | ORAL | Status: AC
  Administered 2016-12-27: 10 mg via ORAL

## 2016-12-27 MED ORDER — METHYLPREDNISOLONE SODIUM SUCC 125 MG IJ SOLR
INTRAMUSCULAR | Status: AC
Start: 2016-12-27 — End: 2016-12-27
  Filled 2016-12-27: qty 2

## 2016-12-27 MED ORDER — PROCHLORPERAZINE MALEATE 10 MG PO TABS
ORAL_TABLET | ORAL | Status: AC
  Filled 2016-12-27: qty 1

## 2016-12-27 MED ORDER — DIPHENHYDRAMINE HCL 25 MG PO CAPS
ORAL_CAPSULE | ORAL | Status: AC
  Filled 2016-12-27: qty 2

## 2016-12-27 MED ORDER — PROCHLORPERAZINE MALEATE 10 MG PO TABS
10.0000 mg | ORAL_TABLET | Freq: Once | ORAL | Status: AC
  Administered 2016-12-27: 10 mg via ORAL

## 2016-12-27 MED ORDER — SODIUM CHLORIDE 0.9 % IV SOLN
Freq: Once | INTRAVENOUS | Status: AC
  Administered 2016-12-27: 11:00:00 via INTRAVENOUS

## 2016-12-27 MED ORDER — MONTELUKAST SODIUM 10 MG PO TABS
ORAL_TABLET | ORAL | Status: AC
  Filled 2016-12-27: qty 1

## 2016-12-27 MED ORDER — DIPHENHYDRAMINE HCL 25 MG PO CAPS
50.0000 mg | ORAL_CAPSULE | Freq: Once | ORAL | Status: AC
  Administered 2016-12-27: 50 mg via ORAL

## 2016-12-27 MED ORDER — ACETAMINOPHEN 325 MG PO TABS
650.0000 mg | ORAL_TABLET | Freq: Once | ORAL | Status: AC
  Administered 2016-12-27: 650 mg via ORAL

## 2016-12-27 MED ORDER — SODIUM CHLORIDE 0.9 % IV SOLN
16.0000 mg/kg | Freq: Once | INTRAVENOUS | Status: AC
  Administered 2016-12-27: 900 mg via INTRAVENOUS
  Filled 2016-12-27: qty 40

## 2016-12-27 MED ORDER — ACETAMINOPHEN 325 MG PO TABS
ORAL_TABLET | ORAL | Status: AC
Start: 2016-12-27 — End: 2016-12-27
  Filled 2016-12-27: qty 2

## 2016-12-27 NOTE — Patient Instructions (Signed)
[facility] Discharge Instructions for Patients Receiving Chemotherapy  Today you received the following chemotherapy agents Darzalex.  To help prevent nausea and vomiting after your treatment, we encourage you to take your nausea medication as directed.  If you develop nausea and vomiting that is not controlled by your nausea medication, call the clinic.   BELOW ARE SYMPTOMS THAT SHOULD BE REPORTED IMMEDIATELY:  *FEVER GREATER THAN 100.5 F  *CHILLS WITH OR WITHOUT FEVER  NAUSEA AND VOMITING THAT IS NOT CONTROLLED WITH YOUR NAUSEA MEDICATION  *UNUSUAL SHORTNESS OF BREATH  *UNUSUAL BRUISING OR BLEEDING  TENDERNESS IN MOUTH AND THROAT WITH OR WITHOUT PRESENCE OF ULCERS  *URINARY PROBLEMS  *BOWEL PROBLEMS  UNUSUAL RASH Items with * indicate a potential emergency and should be followed up as soon as possible.  Feel free to call the clinic you have any questions or concerns. The clinic phone number is [phone].  Please show the CHEMO ALERT CARD at check-in to the Emergency Department and triage nurse.    

## 2016-12-28 LAB — IGG, IGA, IGM
IgA, Qn, Serum: 6 mg/dL — ABNORMAL LOW (ref 64–422)
IgG, Qn, Serum: 346 mg/dL — ABNORMAL LOW (ref 700–1600)
IgM, Qn, Serum: 14 mg/dL — ABNORMAL LOW (ref 26–217)

## 2016-12-29 ENCOUNTER — Encounter: Payer: Self-pay | Admitting: Hematology

## 2016-12-29 LAB — PROTEIN ELECTROPHORESIS, SERUM
A/G Ratio: 2 — ABNORMAL HIGH (ref 0.7–1.7)
ALPHA 1: 0.3 g/dL (ref 0.0–0.4)
Albumin: 3.8 g/dL (ref 2.9–4.4)
Alpha 2: 0.6 g/dL (ref 0.4–1.0)
Beta: 0.8 g/dL (ref 0.7–1.3)
GAMMA GLOBULIN: 0.3 g/dL — AB (ref 0.4–1.8)
GLOBULIN, TOTAL: 1.9 g/dL — AB (ref 2.2–3.9)
M-SPIKE, %: 0.1 g/dL — AB
TOTAL PROTEIN: 5.7 g/dL — AB (ref 6.0–8.5)

## 2016-12-30 ENCOUNTER — Ambulatory Visit: Payer: Medicare Other | Admitting: Hematology

## 2016-12-30 LAB — UPEP/UIFE/LIGHT CHAINS/TP, 24-HR UR
% BETA, Urine: 0 %
ALBUMIN, U: 100 %
ALPHA 1 URINE: 0 %
ALPHA-2-GLOBULIN, U: 0 %
Free Kappa Lt Chains,Ur: 14.1 mg/L (ref 1.35–24.19)
Free Lambda Lt Chains,Ur: 0.36 mg/L (ref 0.24–6.66)
GAMMA GLOBULIN URINE: 0 %
KAPPA/LAMBDA RATIO, U: 39.17 — AB (ref 2.04–10.37)
PROTEIN 24H UR: 59 mg/(24.h) (ref 30–150)
PROTEIN,TOTAL,URINE: 6.4 mg/dL

## 2016-12-30 LAB — KAPPA/LAMBDA LIGHT CHAINS
IG LAMBDA FREE LIGHT CHAIN: 2.9 mg/L — AB (ref 5.7–26.3)
Ig Kappa Free Light Chain: 5.4 mg/L (ref 3.3–19.4)
KAPPA/LAMBDA FLC RATIO: 1.86 — AB (ref 0.26–1.65)

## 2016-12-31 ENCOUNTER — Ambulatory Visit (INDEPENDENT_AMBULATORY_CARE_PROVIDER_SITE_OTHER): Payer: Medicare Other | Admitting: *Deleted

## 2016-12-31 ENCOUNTER — Encounter: Payer: Self-pay | Admitting: *Deleted

## 2016-12-31 VITALS — BP 104/60 | HR 80 | Temp 98.5°F | Ht 66.0 in | Wt 120.4 lb

## 2016-12-31 DIAGNOSIS — R739 Hyperglycemia, unspecified: Secondary | ICD-10-CM | POA: Diagnosis not present

## 2016-12-31 DIAGNOSIS — E785 Hyperlipidemia, unspecified: Secondary | ICD-10-CM

## 2016-12-31 DIAGNOSIS — Z0000 Encounter for general adult medical examination without abnormal findings: Secondary | ICD-10-CM

## 2016-12-31 LAB — IFE, DARA-SPECIFIC, SERUM
IGG (IMMUNOGLOBIN G), SERUM: 343 mg/dL — AB (ref 700–1600)
IgA, Qn, Serum: 6 mg/dL — ABNORMAL LOW (ref 64–422)
IgM, Qn, Serum: 14 mg/dL — ABNORMAL LOW (ref 26–217)

## 2016-12-31 LAB — POCT GLYCOSYLATED HEMOGLOBIN (HGB A1C): Hemoglobin A1C: 5.2

## 2016-12-31 NOTE — Progress Notes (Signed)
Subjective:   Shannon Rush is a 76 y.o. female who presents for Medicare Annual (Subsequent) preventive examination.  Cardiac Risk Factors include: advanced age (>72mn, >>28women);dyslipidemia;hypertension     Objective:     Vitals: BP 104/60 (BP Location: Right Arm, Patient Position: Sitting, Cuff Size: Small)   Pulse 80   Temp 98.5 F (36.9 C) (Oral)   Ht _0  (1.676 m)   Wt 120 lb 6.4 oz (54.6 kg)   SpO2 94%   BMI 19.43 kg/m   Body mass index is 19.43 kg/m.   Tobacco History  Smoking Status  . Never Smoker  Smokeless Tobacco  . Never Used    Patient has never smoked and has no plans to start.   Past Medical History:  Diagnosis Date  . CAD (coronary artery disease)    a. inf STEMI (in [facility]) [place] [facility] [place]  pRCA 60%, mid to dist RCA 50% >>> med rx  . Cord compression ([facility] 07/13/12   MRI- diffuse myeloma involvement of T-L spine  . History of radiation therapy 07/13/12-07/27/12   spinal cord compression T3-T10,left scapula  . Hx of echocardiogram    [facility]  Normal LVEF without significant regional wall motion abnormalities. Mild   . Multiple myeloma ([facility] 07/01/2012  . OSTEOPOROSIS 06/11/2010   Multiple compression fractures; and spontaneous fracture of sternum Qualifier: Diagnosis of  By: SZebedee IbaNP, SManuela Schwartz    . Thoracic kyphosis 07/13/12   per MRI scan  . Unspecified deficiency anemia    Past Surgical History:  Procedure Laterality Date  . APPENDECTOMY    . CESAREAN SECTION     x2   . COLONOSCOPY  2007   neg with Dr. MWatt Climes . ELBOW SURGERY    . HIP SURGERY  2009   left  . TUBAL LIGATION     Family History  Problem Relation Age of Onset  . Glaucoma Mother   . Heart disease Mother   . Peripheral vascular disease Mother   . Raynaud syndrome Mother   . Heart disease Father   . Heart failure Father   . Glaucoma Father   . Cancer Brother    stage IV prostate CA  . Raynaud syndrome Daughter   . Raynaud syndrome Daughter    History  Sexual Activity  . Sexual activity: Yes    Outpatient Encounter Prescriptions as of 12/31/2016  Medication Sig  . acetaminophen (TYLENOL) 500 MG tablet Take 500 mg by mouth every 6 (six) hours as needed for moderate pain (pain). Reported on 07/20/2015  . acyclovir (ZOVIRAX) 400 MG tablet Take 1 tablet (400 mg total) by mouth 2 (two) times daily.  .Marland Kitchenaspirin EC 81 MG tablet Take 1 tablet (81 mg total) by mouth daily.  .Marland Kitchenatorvastatin (LIPITOR) 40 MG tablet TAKE 1 TABLET (40 MG TOTAL) BY MOUTH DAILY.  . calcium carbonate (OS-CAL) 600 MG TABS tablet Take 600 mg by mouth daily.  .Marland Kitchendexamethasone (DECADRON) 4 MG tablet Take 5 tab the day before chemo, every 2 weeks  . fluticasone (FLONASE) 50 MCG/ACT nasal spray Place 2 sprays into both nostrils daily.  . furosemide (LASIX) 20 MG tablet Take 20 mg by mouth 2 (two) times daily as needed for edema.  .Marland Kitchenlisinopril (PRINIVIL,ZESTRIL) 2.5 MG tablet Take 1 tablet (2.5 mg total) by mouth daily.  . metoprolol succinate (TOPROL-XL) 25 MG 24 hr tablet TAKE 1 TABLET (25  MG TOTAL) BY MOUTH DAILY.  . nitroGLYCERIN (NITROSTAT) 0.4 MG SL tablet Place under the tongue as needed. Reported on 07/20/2015  . PROAIR HFA 108 (90 BASE) MCG/ACT inhaler Inhale 2 puffs into the lungs every 6 (six) hours as needed.  . Bismuth Subsalicylate 836 MG TABS Take 2 tablets every 30 to 60 minutes as needed for up to 8 doses/24 hours (Patient not taking: Reported on 12/04/2016)  . ondansetron (ZOFRAN) 8 MG tablet Take 1 tablet (8 mg total) by mouth 2 (two) times daily as needed (Nausea or vomiting). (Patient not taking: Reported on 12/31/2016)  . prochlorperazine (COMPAZINE) 10 MG tablet Take 1 tablet (10 mg total) by mouth every 6 (six) hours as needed (Nausea or vomiting). (Patient not taking: Reported on 09/27/2016)   Facility-Administered Encounter Medications as of 12/31/2016  Medication  . 0.9  %  sodium chloride infusion  . 0.9 %  sodium chloride infusion  . acetaminophen (TYLENOL) tablet 650 mg  . acetaminophen (TYLENOL) tablet 650 mg  . furosemide (LASIX) injection 10 mg  . furosemide (LASIX) injection 10 mg  . heparin lock flush 100 unit/mL  . heparin lock flush 100 unit/mL  . heparin lock flush 100 unit/mL  . heparin lock flush 100 unit/mL  . sodium chloride flush (NS) 0.9 % injection 10 mL  . sodium chloride flush (NS) 0.9 % injection 10 mL  . sodium chloride flush (NS) 0.9 % injection 10 mL  . sodium chloride flush (NS) 0.9 % injection 3 mL  . sodium chloride flush (NS) 0.9 % injection 3 mL    Activities of Daily Living In your present state of health, do you have any difficulty performing the following activities: 12/31/2016 03/10/2016  Hearing? N N  Vision? N N  Difficulty concentrating or making decisions? N N  Walking or climbing stairs? Y N  Dressing or bathing? N N  Doing errands, shopping? N N  Preparing Food and eating ? N -  Using the Toilet? N -  In the past six months, have you accidently leaked urine? Y -  Do you have problems with loss of bowel control? N -  Managing your Medications? N -  Managing your Finances? N -  Housekeeping or managing your Housekeeping? N -  Some recent data might be hidden   Home Safety:  My home has a working smoke alarm:  Yes X 3           My home throw rugs have been fastened down to the floor or removed:  Removed I have a non-slip surface or non-slip mats in the bathtub and shower:  Yes and building a handicap shower        All my home's stairs have handrails, including any outdoor stairs  Two level home with handrails inside and 2 level deck outside with handrails        My home's floors, stairs and hallways are free from clutter, wires and cords:  Yes     I have animals in my home  Yes, 65 lb black lab/boxer mix I wear seatbelts consistently:  Yes    Patient Care Team: [name], MD as PCP - General  (Family Medicine) Stefanie Libel, MD (Family Medicine) [name], MD as Attending Physician (Obstetrics and Gynecology) [name], MD as Consulting Physician (Internal Medicine) [name], MD as Consulting Physician (Hematology) [name], MD as Consulting Physician (Ophthalmology) [name], MD as Consulting Physician (Cardiology)    Assessment:  Exercise Activities and Dietary recommendations Current Exercise Habits: Home exercise routine, Type of exercise: strength training/weights;walking (biking, gardening, swimming), Time (Minutes): 60, Frequency (Times/Week): 3, Weekly Exercise (Minutes/Week): 180, Intensity: Moderate, Exercise limited by: orthopedic condition(s) (Back and feet pain)  Goals    . Exercise 150 minutes per week (moderate activity)          Continue current regimine      Fall Risk Fall Risk  12/31/2016 02/15/2016 01/09/2016 11/04/2014 09/23/2014  Falls in the past year? Yes No No No No  Number falls in past yr: 2 or more - - - -  Injury with Fall? Yes - - - -  Risk Factor Category  High Fall Risk - - - -  Risk for fall due to : Impaired balance/gait - - - -  Follow up Falls evaluation completed;Education provided;Falls prevention discussed - - - -   TUG Test:  Done in 7 seconds. Falls prevention discussed in detail and literature given. PCP notified of High Fall Risk status  Cognitive Function: Mini-Cog  Passed with score 5/5   Depression Screen PHQ 2/9 Scores 12/31/2016 02/15/2016 01/09/2016 11/04/2014  PHQ - 2 Score 2 0 0 0  PHQ- 9 Score 6 - - -  Patient became teary when talking about son-in-law's recent death on Easter Sunday. Declined to speak with [facility].  Cognitive Function MMSE - Mini Mental State Exam 12/26/2010  Orientation to time 5  Orientation to Place 5  Registration 3  Attention/ Calculation 5  Recall 3  Language- name 2 objects 2  Language- repeat 1  Language- follow 3 step command 3  Language- read &  follow direction 1  Write a sentence 1  Copy design 1  Total score 30        Immunization History  Administered Date(s) Administered  . Influenza Split 04/17/2012  . Influenza Whole 06/02/2009  . Influenza, High Dose Seasonal PF 04/15/2016  . Influenza,inj,Quad PF,36+ Mos 05/12/2013, 04/11/2014, 04/20/2015  . Pneumococcal Conjugate-13 09/03/2013, 10/01/2013  . Pneumococcal Polysaccharide-23 11/04/2014  . Td 07/15/2001, 11/20/2013   Screening Tests Health Maintenance  Topic Date Due  . MAMMOGRAM  11/09/2016  . INFLUENZA VACCINE  02/12/2017  . COLONOSCOPY  07/21/2018  . TETANUS/TDAP  11/21/2023  . DEXA SCAN  Completed  . PNA vac Low Risk Adult  Completed      Plan:     Lipid panel drawn today Hgb A1c done today = 5.2 Patient declines to have Dexa scan as she has previously failed Fosamax and Actonel Patient will contact [facility] for mammogram  I have personally reviewed and noted the following in the patient's chart:   . Medical and social history . Use of alcohol, tobacco or illicit drugs  . Current medications and supplements . Functional ability and status . Nutritional status . Physical activity . Advanced directives . List of other physicians . Hospitalizations, surgeries, and ER visits in previous 12 months . Vitals . Screenings to include cognitive, depression, and falls . Referrals and appointments  In addition, I have reviewed and discussed with patient certain preventive protocols, quality metrics, and best practice recommendations. A written personalized care plan for preventive services as well as general preventive health recommendations were provided to patient.     Velora Heckler, RN  12/31/2016

## 2016-12-31 NOTE — Patient Instructions (Addendum)
Shannon Rush,  Thank you for taking time to come for yourMedicare Wellness Visit. I appreciate your ongoing commitment to your health goals. Please review the following plan we discussed and let me know if I can assist you in the future.   These are the goals we discussed:  Goals    . Exercise 150 minutes per week (moderate activity)          Continue current regimine         Fall Prevention in the Home Falls can cause injuries. They can happen to people of all ages. There are many things you can do to make your home safe and to help prevent falls. What can I do on the outside of my home?  Regularly fix the edges of walkways and driveways and fix any cracks.  Remove anything that might make you trip as you walk through a door, such as a raised step or threshold.  Trim any bushes or trees on the path to your home.  Use bright outdoor lighting.  Clear any walking paths of anything that might make someone trip, such as rocks or tools.  Regularly check to see if handrails are loose or broken. Make sure that both sides of any steps have handrails.  Any raised decks and porches should have guardrails on the edges.  Have any leaves, snow, or ice cleared regularly.  Use sand or salt on walking paths during winter.  Clean up any spills in your garage right away. This includes oil or grease spills. What can I do in the bathroom?  Use night lights.  Install grab bars by the toilet and in the tub and shower. Do not use towel bars as grab bars.  Use non-skid mats or decals in the tub or shower.  If you need to sit down in the shower, use a plastic, non-slip stool.  Keep the floor dry. Clean up any water that spills on the floor as soon as it happens.  Remove soap buildup in the tub or shower regularly.  Attach bath mats securely with double-sided non-slip rug tape.  Do not have throw rugs and other things on the floor that can make you trip. What can I do in the  bedroom?  Use night lights.  Make sure that you have a light by your bed that is easy to reach.  Do not use any sheets or blankets that are too big for your bed. They should not hang down onto the floor.  Have a firm chair that has side arms. You can use this for support while you get dressed.  Do not have throw rugs and other things on the floor that can make you trip. What can I do in the kitchen?  Clean up any spills right away.  Avoid walking on wet floors.  Keep items that you use a lot in easy-to-reach places.  If you need to reach something above you, use a strong step stool that has a grab bar.  Keep electrical cords out of the way.  Do not use floor polish or wax that makes floors slippery. If you must use wax, use non-skid floor wax.  Do not have throw rugs and other things on the floor that can make you trip. What can I do with my stairs?  Do not leave any items on the stairs.  Make sure that there are handrails on both sides of the stairs and use them. Fix handrails that are broken or loose. Make  sure that handrails are as long as the stairways.  Check any carpeting to make sure that it is firmly attached to the stairs. Fix any carpet that is loose or worn.  Avoid having throw rugs at the top or bottom of the stairs. If you do have throw rugs, attach them to the floor with carpet tape.  Make sure that you have a light switch at the top of the stairs and the bottom of the stairs. If you do not have them, ask someone to add them for you. What else can I do to help prevent falls?  Wear shoes that: ? Do not have high heels. ? Have rubber bottoms. ? Are comfortable and fit you well. ? Are closed at the toe. Do not wear sandals.  If you use a stepladder: ? Make sure that it is fully opened. Do not climb a closed stepladder. ? Make sure that both sides of the stepladder are locked into place. ? Ask someone to hold it for you, if possible.  Clearly mark and make  sure that you can see: ? Any grab bars or handrails. ? First and last steps. ? Where the edge of each step is.  Use tools that help you move around (mobility aids) if they are needed. These include: ? Canes. ? Walkers. ? Scooters. ? Crutches.  Turn on the lights when you go into a dark area. Replace any light bulbs as soon as they burn out.  Set up your furniture so you have a clear path. Avoid moving your furniture around.  If any of your floors are uneven, fix them.  If there are any pets around you, be aware of where they are.  Review your medicines with your doctor. Some medicines can make you feel dizzy. This can increase your chance of falling. Ask your doctor what other things that you can do to help prevent falls. This information is not intended to replace advice given to you by your health care provider. Make sure you discuss any questions you have with your health care provider. Document Released: 04/27/2009 Document Revised: 12/07/2015 Document Reviewed: 08/05/2014 Elsevier Interactive Patient Education  2018 [employer] Maintenance, Female Adopting a healthy lifestyle and getting preventive care can go a long way to promote health and wellness. Talk with your health care provider about what schedule of regular examinations is right for you. This is a good chance for you to check in with your provider about disease prevention and staying healthy. In between checkups, there are plenty of things you can do on your own. Experts have done a lot of research about which lifestyle changes and preventive measures are most likely to keep you healthy. Ask your health care provider for more information. Weight and diet Eat a healthy diet  Be sure to include plenty of vegetables, fruits, low-fat dairy products, and lean protein.  Do not eat a lot of foods high in solid fats, added sugars, or salt.  Get regular exercise. This is one of the most important things you can do  for your health. ? Most adults should exercise for at least 150 minutes each week. The exercise should increase your heart rate and make you sweat (moderate-intensity exercise). ? Most adults should also do strengthening exercises at least twice a week. This is in addition to the moderate-intensity exercise.  Maintain a healthy weight  Body mass index (BMI) is a measurement that can be used to identify possible weight problems. It estimates  body fat based on height and weight. Your health care provider can help determine your BMI and help you achieve or maintain a healthy weight.  For females 61 years of age and older: ? A BMI below 18.5 is considered underweight. ? A BMI of 18.5 to 24.9 is normal. ? A BMI of 25 to 29.9 is considered overweight. ? A BMI of 30 and above is considered obese.  Watch levels of cholesterol and blood lipids  You should start having your blood tested for lipids and cholesterol at 75 years of age, then have this test every 5 years.  You may need to have your cholesterol levels checked more often if: ? Your lipid or cholesterol levels are high. ? You are older than 75 years of age. ? You are at high risk for heart disease.  Cancer screening Lung Cancer  Lung cancer screening is recommended for adults 63-67 years old who are at high risk for lung cancer because of a history of smoking.  A yearly low-dose CT scan of the lungs is recommended for people who: ? Currently smoke. ? Have quit within the past 15 years. ? Have at least a 30-pack-year history of smoking. A pack year is smoking an average of one pack of cigarettes a day for 1 year.  Yearly screening should continue until it has been 15 years since you quit.  Yearly screening should stop if you develop a health problem that would prevent you from having lung cancer treatment.  Breast Cancer  Practice breast self-awareness. This means understanding how your breasts normally appear and feel.  It  also means doing regular breast self-exams. Let your health care provider know about any changes, no matter how small.  If you are in your 20s or 30s, you should have a clinical breast exam (CBE) by a health care provider every 1-3 years as part of a regular health exam.  If you are 34 or older, have a CBE every year. Also consider having a breast X-ray (mammogram) every year.  If you have a family history of breast cancer, talk to your health care provider about genetic screening.  If you are at high risk for breast cancer, talk to your health care provider about having an MRI and a mammogram every year.  Breast cancer gene (BRCA) assessment is recommended for women who have family members with BRCA-related cancers. BRCA-related cancers include: ? Breast. ? Ovarian. ? Tubal. ? Peritoneal cancers.  Results of the assessment will determine the need for genetic counseling and BRCA1 and BRCA2 testing.  Cervical Cancer Your health care provider may recommend that you be screened regularly for cancer of the pelvic organs (ovaries, uterus, and vagina). This screening involves a pelvic examination, including checking for microscopic changes to the surface of your cervix (Pap test). You may be encouraged to have this screening done every 3 years, beginning at age 53.  For women ages 43-65, health care providers may recommend pelvic exams and Pap testing every 3 years, or they may recommend the Pap and pelvic exam, combined with testing for human papilloma virus (HPV), every 5 years. Some types of HPV increase your risk of cervical cancer. Testing for HPV may also be done on women of any age with unclear Pap test results.  Other health care providers may not recommend any screening for nonpregnant women who are considered low risk for pelvic cancer and who do not have symptoms. Ask your health care provider if a screening pelvic exam  is right for you.  If you have had past treatment for cervical  cancer or a condition that could lead to cancer, you need Pap tests and screening for cancer for at least 20 years after your treatment. If Pap tests have been discontinued, your risk factors (such as having a new sexual partner) need to be reassessed to determine if screening should resume. Some women have medical problems that increase the chance of getting cervical cancer. In these cases, your health care provider may recommend more frequent screening and Pap tests.  Colorectal Cancer  This type of cancer can be detected and often prevented.  Routine colorectal cancer screening usually begins at 75 years of age and continues through 75 years of age.  Your health care provider may recommend screening at an earlier age if you have risk factors for colon cancer.  Your health care provider may also recommend using home test kits to check for hidden blood in the stool.  A small camera at the end of a tube can be used to examine your colon directly (sigmoidoscopy or colonoscopy). This is done to check for the earliest forms of colorectal cancer.  Routine screening usually begins at age 2.  Direct examination of the colon should be repeated every 5-10 years through 75 years of age. However, you may need to be screened more often if early forms of precancerous polyps or small growths are found.  Skin Cancer  Check your skin from head to toe regularly.  Tell your health care provider about any new moles or changes in moles, especially if there is a change in a mole's shape or color.  Also tell your health care provider if you have a mole that is larger than the size of a pencil eraser.  Always use sunscreen. Apply sunscreen liberally and repeatedly throughout the day.  Protect yourself by wearing long sleeves, pants, a wide-brimmed hat, and sunglasses whenever you are outside.  Heart disease, diabetes, and high blood pressure  High blood pressure causes heart disease and increases the risk  of stroke. High blood pressure is more likely to develop in: ? People who have blood pressure in the high end of the normal range (130-139/85-89 mm Hg). ? People who are overweight or obese. ? People who are African American.  If you are 63-82 years of age, have your blood pressure checked every 3-5 years. If you are 62 years of age or older, have your blood pressure checked every year. You should have your blood pressure measured twice-once when you are at a hospital or clinic, and once when you are not at a hospital or clinic. Record the average of the two measurements. To check your blood pressure when you are not at a hospital or clinic, you can use: ? An automated blood pressure machine at a pharmacy. ? A home blood pressure monitor.  If you are between 40 years and 5 years old, ask your health care provider if you should take aspirin to prevent strokes.  Have regular diabetes screenings. This involves taking a blood sample to check your fasting blood sugar level. ? If you are at a normal weight and have a low risk for diabetes, have this test once every three years after 75 years of age. ? If you are overweight and have a high risk for diabetes, consider being tested at a younger age or more often. Preventing infection Hepatitis B  If you have a higher risk for hepatitis B, you should be  screened for this virus. You are considered at high risk for hepatitis B if: ? You were born in a country where hepatitis B is common. Ask your health care provider which countries are considered high risk. ? Your parents were born in a high-risk country, and you have not been immunized against hepatitis B (hepatitis B vaccine). ? You have HIV or AIDS. ? You use needles to inject street drugs. ? You live with someone who has hepatitis B. ? You have had sex with someone who has hepatitis B. ? You get hemodialysis treatment. ? You take certain medicines for conditions, including cancer, organ  transplantation, and autoimmune conditions.  Hepatitis C  Blood testing is recommended for: ? Everyone born from [birth year] through 1964. ? Anyone with known risk factors for hepatitis C.  Sexually transmitted infections (STIs)  You should be screened for sexually transmitted infections (STIs) including gonorrhea and chlamydia if: ? You are sexually active and are younger than 75 years of age. ? You are older than 75 years of age and your health care provider tells you that you are at risk for this type of infection. ? Your sexual activity has changed since you were last screened and you are at an increased risk for chlamydia or gonorrhea. Ask your health care provider if you are at risk.  If you do not have HIV, but are at risk, it may be recommended that you take a prescription medicine daily to prevent HIV infection. This is called pre-exposure prophylaxis (PrEP). You are considered at risk if: ? You are sexually active and do not regularly use condoms or know the HIV status of your partner(s). ? You take drugs by injection. ? You are sexually active with a partner who has HIV.  Talk with your health care provider about whether you are at high risk of being infected with HIV. If you choose to begin PrEP, you should first be tested for HIV. You should then be tested every 3 months for as long as you are taking PrEP. Pregnancy  If you are premenopausal and you may become pregnant, ask your health care provider about preconception counseling.  If you may become pregnant, take 400 to 800 micrograms (mcg) of folic acid every day.  If you want to prevent pregnancy, talk to your health care provider about birth control (contraception). Osteoporosis and menopause  Osteoporosis is a disease in which the bones lose minerals and strength with aging. This can result in serious bone fractures. Your risk for osteoporosis can be identified using a bone density scan.  If you are 65 years of age or  older, or if you are at risk for osteoporosis and fractures, ask your health care provider if you should be screened.  Ask your health care provider whether you should take a calcium or vitamin D supplement to lower your risk for osteoporosis.  Menopause may have certain physical symptoms and risks.  Hormone replacement therapy may reduce some of these symptoms and risks. Talk to your health care provider about whether hormone replacement therapy is right for you. Follow these instructions at home:  Schedule regular health, dental, and eye exams.  Stay current with your immunizations.  Do not use any tobacco products including cigarettes, chewing tobacco, or electronic cigarettes.  If you are pregnant, do not drink alcohol.  If you are breastfeeding, limit how much and how often you drink alcohol.  Limit alcohol intake to no more than 1 drink per day for nonpregnant women.  One drink equals 12 ounces of beer, 5 ounces of wine, or 1 ounces of hard liquor.  Do not use street drugs.  Do not share needles.  Ask your health care provider for help if you need support or information about quitting drugs.  Tell your health care provider if you often feel depressed.  Tell your health care provider if you have ever been abused or do not feel safe at home. This information is not intended to replace advice given to you by your health care provider. Make sure you discuss any questions you have with your health care provider. Document Released: 01/14/2011 Document Revised: 12/07/2015 Document Reviewed: 04/04/2015 Elsevier Interactive Patient Education  [employer].

## 2017-01-01 ENCOUNTER — Ambulatory Visit: Payer: Medicare Other

## 2017-01-01 ENCOUNTER — Encounter: Payer: Self-pay | Admitting: *Deleted

## 2017-01-01 ENCOUNTER — Other Ambulatory Visit: Payer: Medicare Other

## 2017-01-01 LAB — LIPID PANEL
CHOL/HDL RATIO: 2 ratio (ref 0.0–4.4)
Cholesterol, Total: 121 mg/dL (ref 100–199)
HDL: 62 mg/dL (ref >39)
LDL Calculated: 39 mg/dL (ref 0–99)
Triglycerides: 100 mg/dL (ref 0–149)
VLDL CHOLESTEROL CAL: 20 mg/dL (ref 5–40)

## 2017-01-01 NOTE — Progress Notes (Signed)
Patient ID: Shannon Rush, female   DOB: 14-Jun-1941, 77 y.o.   MRN: [id number] I have reviewed this visit and discussed with Howell Rucks, RN, BSN, and agree with her documentation.

## 2017-01-02 ENCOUNTER — Telehealth: Payer: Self-pay | Admitting: Hematology

## 2017-01-02 NOTE — Telephone Encounter (Signed)
sw pt to confirm 7/13 appt at 0800 per sch msg

## 2017-01-22 NOTE — Progress Notes (Signed)
[facility] OFFICE PROGRESS NOTE   Shannon Resides, MD [place] [address]  DIAGNOSIS: Multiple myeloma with relapse  CHIEF COMPLAINS: Follow-up of multiple myeloma   Oncology History   Multiple myeloma [facility])   Staging form: Multiple Myeloma, AJCC 6th Edition   - Clinical: Stage IIIA - Signed by Shannon Norway, MD on 09/04/2013      Multiple myeloma ([place])   05/26/2012 Initial Diagnosis    Presenting IgG was 4,040 mg/dL on 06/05/2012 (IgA 35; IgM 34); kappa free light chain 34.4 mg/dL, lambda 0.00, kappa:lambda ratio of 34.75; SPEP with M-spike of 2.60.      06/30/2012 Imaging    Numerous lytic lesions throughout the calvarium.  Lytic lesion within the left lateral scapula.  Findings most compatible with metastases or myeloma. Multiple mid and lower thoracic compression fractures.  Slight compression through the endplates at L3.      35/59/7416 Bone Marrow Biopsy    Bone marrow biopsy showed 49% plasma cell. Normal classical cytogenetics; however, myeloma FISH panel showed 13q- (intermediate risk)          07/14/2012 - 07/27/2012 Radiation Therapy    Palliative radiation 20 Gy over 10 fractions between to thoracic spine cord compression and symptomatic left scapula lytic lesion.      08/10/2012 -  Chemotherapy    Started SQ Velcade once weekly, 3 weeks on, 1 weeks off; daily Revlimid d1-21, 7 days off; and Dexamethasone 54m PO weekly.       02/12/2013 Bone Marrow Transplant    [facility]       06/14/2013 Tumor Marker    IgG  1830,  Kappa:lamba ratio, 1.69 (baseline was 34.75 or   M-spike 0.28 (baseline of 2.6 or  89.3% of baseline).        06/25/2013 -  Chemotherapy    Started zometa 3.5 mg monthly       09/01/2013 -  Chemotherapy    Maintenance therapy with revlimid 558mdaily for 14 days and then off for 7 (decreased from 21 days on and 7 days off based on neutropenia).       09/13/2013 -  09/17/2014 Hospital Admission    Hospital admission for pneumonia      12/20/2013 Treatment Plan Change    Maintenance therapy decreased to 2.5 mg daily for 21 days and then off for 7 days based on low counts.       01/25/2014 Tumor Marker    IgG 1360 M spike 0.5      03/01/2014 - 03/05/2014 Hospital Ad[facility], received thrombolytic therapy, cath showed 60% prox RCA, mid-distal RCA 50%, Echo normal, no stents.      04/11/2014 - 08/07/2014 Chemotherapy    Continue Revlimid at 2.5 mg 3 weeks on 1 week off      08/08/2014 - 08/13/2014 Hospital Admission    Hospital admission for pneumonia. Her Revlimid was held.      08/29/2014 - 09/14/2014 Chemotherapy    Maintenance Revlimid restarted      09/14/2014 - 09/17/2014 Hospital Admission    [place]      11/13/2014 - 12/25/2015 Chemotherapy    Maintenance Revlimid restarted, changed to 2.78m38maily, 2 weks on, 1 week off from 12/15/2014, stopped due to disease progression  01/24/2016 Progression    Patient is M protein has gradually increased to 1.0g, repeated a bone marrow biopsy showed plasma cell 10-30%      02/27/2016 -  Chemotherapy    Pomalidomide 4 mg daily on day 1-21, dexamethasone 40 mg on day 1, 8 and 15, every 28 days, started on 02/27/2016, daratumumab weekly started on 03/07/2016, held after first dose, due to hospitalization and a severe pancytopenia      03/10/2016 - 03/29/2016 Hospital Admission    Pt was admitted for sepsis from pneumonia, and severe pancytopenia. She required ICU stay for a few days due to hypotension, developed b/l pleural effusion required diuretics and b/l thoracentesis. She also required blood and plt transfusion, and prolonged neupogen injection for severe neutropenia. She was discharged home after 3 weeks hospital stay       04/26/2016 -  Chemotherapy     Daratumumab restarted on 04/26/2016, Velcade 1.42m/m2 and dexa 232mweekly start on 11/3, velcade held since 06/28/2016 due to CHF       08/26/2016 Echocardiogram    - Left ventricle: LVEF is approximately 35% with diffuse diffuse   hypokinesis with severe hypokinesis/ akinesis of the   inferior/inferoseptal walls. In comparison to echo images from   December 2017, there does not appear to be significant change The   cavity size was normal. Wall thickness was normal. Doppler   parameters are consistent with abnormal left ventricular   relaxation (grade 1 diastolic dysfunction). - Aortic valve: AV is thickened. 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 Present in echo from December 2017 There was   trivial regurgitation. - Right ventricle: Systolic function was mildly reduced. - Right atrium: The atrium was mildly dilated. - Pericardium, extracardiac: A trivial pericardial effusion was   identified.      12/03/2016 Echocardiogram    -Left Ventricle: Systolic function was   mildly to moderately reduced. The estimated ejection fraction was   in the range of 40% to 45%. Diffuse hypokinesis. Doppler   parameters are consistent with abnormal left ventricular   relaxation (grade 1 diastolic dysfunction). Doppler parameters   are consistent with indeterminate ventricular filling pressure. - Left atrium: The atrium was severely dilated. - Tricuspid valve: There was trivial regurgitation. - Pulmonary arteries: Systolic pressure was within the normal   range. PA peak pressure: 33 mm Hg (S). - Global longitudinal strain -10.5% (abnormal)      CURRENT TREATMENT:   1. Pomalidomide 4 mg daily on day 1-21, dexamethasone 40 mg on day 1, 8 and 15, every 28 days, started on 02/27/2016, daratumumab weekly started on 03/07/2016, treatment on hold since 03/09/2016 due to hospitalization and severe cytopenia 2. Daratumumab restarted on  04/26/2016, Velcade 1.27m52m2 and dexa 9m29mekly start on 11/3, velcade held since 06/28/2016 due to CHF  2. Zometa 3.5 mg monthly started on 06/2013, on every 3 months now   INTERVAL HISTORY:  Shannon SEEMAN76. female with a past medical history of MM as detailed above, s/p SCT is here for follow-up. She presents to the clinic today.  She has a slight dry cough with no fever. She did not go on her trip but [place]    MEDICAL HISTORY: Past Medical History:  Diagnosis Date  . CAD (coronary artery disease)    [place]  LHC (8/15- [place]):  pRCA 60%, mid to dist RCA 50% >>> med rx  . Cord compression ([place]) 07/13/12   MRI- diffuse myeloma involvement of T-L spine  . History of radiation therapy 07/13/12-07/27/12   spinal cord compression T3-T10,left scapula  . Hx of echocardiogram    Echo (03/02/14-done at [facility]):  Normal LVEF without significant regional wall motion abnormalities. Mild   . Multiple myeloma ([place]) 07/01/2012  . OSTEOPOROSIS 06/11/2010   Multiple compression fractures; and spontaneous fracture of sternum Qualifier: Diagnosis of  By: [name], [name]     . Thoracic kyphosis 07/13/12   per MRI scan  . Unspecified deficiency anemia      ALLERGIES:  is allergic to zosyn [piperacillin sod-tazobactam so]; levaquin [levofloxacin in d5w]; other; piperacillin-tazobactam in dex; zithromax [azithromycin]; and ciprofloxacin.   SURGICAL HISTORY:  Past Surgical History:  Procedure Laterality Date  . APPENDECTOMY    . CESAREAN SECTION     x2   . COLONOSCOPY  2007   neg with Dr. [name]  . ELBOW SURGERY    . HIP SURGERY  2009   left  . TUBAL LIGATION      REVIEW OF SYSTEMS:  Constitutional: Denies abnormal weight loss Eyes: Denies blurriness of vision Ears, nose, mouth, throat, and face: Denies mucositis or sore  throat,  Respiratory: Denies dyspnea or wheezes Cardiovascular: Denies palpitation, chest discomfort (+)right leg swelling Gastrointestinal:  Denies nausea, heartburn or change in bowel habits Skin: Denies abnormal skin rashes Lymphatics: Denies new lymphadenopathy or easy bruising Neurological:Denies numbness, tingling or new weaknesses Behavioral/Psych: Mood is stable, no new changes   All other systems were reviewed with the patient and are negative.   PHYSICAL EXAMINATION:  ECOG PERFORMANCE STATUS: 0 BP (!) 138/59 (BP Location: Right Arm, Patient Position: Sitting)   Pulse 75   Temp 97.9 F (36.6 C) (Oral)   Resp 18   Ht _0  (1.676 m)   Wt 122 lb (55.3 kg)   SpO2 98%   BMI 19.69 kg/m   GENERAL:alert, no distress and comfortable; easily mobile to exam table; kyphosis, moderate.  SKIN: skin color, texture, turgor are normal, no rashes or significant lesions except a small healing, dry wound in the right lower leg above his ankle, with surrounding skin erythema and swelling EYES: normal, Conjunctiva are pink, 1 tear/injectionof blood vessel left eye in sclera. OROPHARYNX:no exudate, no erythema and lips, buccal mucosa, and tongue normal, no ONJ NECK: supple, thyroid normal size, non-tender, without nodularity; nodules cervical bilaterally.  LYMPH:  no palpable lymphadenopathy in the cervical, axillary or supraclavicular LUNGS: crackles at the bases bilaterally with normal breathing effort, no wheezes or rhonchi HEART: regular rate & rhythm and no murmurs  ABDOMEN:abdomen soft, and normal bowel sounds Musculoskeletal:no cyanosis of digits and no clubbing, NEURO: alert & oriented x 3 with fluent speech, no focal motor/sensory deficits   Labs:  CBC Latest Ref Rng & Units 01/24/2017 12/27/2016 12/06/2016  WBC 3.9 - 10.3 10e3/uL 3.6(L) 4.0 6.6  Hemoglobin 11.6 - 15.9 g/dL 11.1(L) 10.6(L) 10.1(L)  Hematocrit 34.8 - 46.6 % 32.6(L) 30.8(L) 30.8(L)  Platelets 145 - 400 10e3/uL 175 171  177    CMP Latest Ref Rng & Units 12/27/2016 12/27/2016 12/06/2016  Glucose 70 - 140 mg/dl 203(H) - 88  BUN 7.0 - 26.0 mg/dL 25.3 - 28.0(H)  Creatinine 0.6 - 1.1 mg/dL 1.1 - 0.9  Sodium 136 - 145 mEq/L 139 - 140  Potassium 3.5 -  5.1 mEq/L 4.2 - 3.9  Chloride 101 - 111 mmol/L - - -  CO2 22 - 29 mEq/L 20(L) - 22  Calcium 8.4 - 10.4 mg/dL 9.5 - 9.6  Total Protein 6.0 - 8.5 g/dL 5.7(L) 6.0(L) 6.0  Total Bilirubin 0.20 - 1.20 mg/dL 0.77 - 0.54  Alkaline Phos 40 - 150 U/L 49 - 54  AST 5 - 34 U/L 18 - 24  ALT 0 - 55 U/L 14 - 21    SPEP M-PROTEIN 10/01/2013: 0.18 (nadir)  10/13/2014: 0.6 11/21/2014: 0.5 01/30/2015: 0.6 03/13/2015: 0.6 04/20/2015: 0.5 06/01/2015: 0.5 07/20/2015: 0.7 09/14/2015: 0.7 10/26/2015: 0.8 12/07/2015: 1.0 02/07/2016: 1.1  04/03/2016: 0.2 05/03/2016: 0.2  05/31/2016: 0.1 06/28/2016: 0.1 07/25/2016: 0.1 08/21/2016: 0.1 09/27/2016: 0.1 10/23/16: 0.1 12/27/2016: 0.1 (Dara Specific IFE was still positive) January 31, 2017: PENDING   IgG ([phone]) 10/13/2014: 1710 11/21/2014: 1460 01/30/2015: 1380 03/13/15: 1570 04/20/2015: 1550 06/01/2015: 1590 07/22/2015: 1770 09/14/2015: 9509 10/26/2015: 1898 12/07/2015: 2045 02/07/2016: 1883 04/03/2016: 787 05/03/2016: 685  05/31/2016: 544 06/28/2016: 463 07/25/2016: 455 08/21/2016: 426 09/27/2016: 438 10/23/16: 374 12/06/16: 370 12/27/16: 343, 346 Jan 31, 2017: PENDING   Serum kappa, lambda light chains, and ratio  11/21/2014: 3.95, 2.08, 1.90  01/30/2015: 3.92, 2.64, 1.48 03/13/2015: 3.55, 2.17, 1.64 04/20/15: 4.52, 1.78, 2.54 06/01/2015: 4.82, 1.75, 2.75 07/20/2015: 8.71, 2.43, 3.58 09/14/2015: 9.17, 2.02, 4.55 10/26/2015: 11.4, 1.75, 6.42 12/07/2015: 1.54, 1.83, 8.44 02/07/2016: 1.93, 1.42, 13.6 04/03/2016: 0.96, 0.95, 1.01  05/03/2016: 0.71, 0.34, 2.09  05/31/2016: 0.48, 0.26, 1.85 06/28/2016: 0.52, 0.60, 0.87 07/25/2016: 0.46, 0.27, 1.70 08/21/2016: 0.43, 0.27, 1.59 09/27/2016: 0.52, 0.24, 2.17 10/23/16: .0.49, 0.35, 1.40 12/06/16: 0.68, 0.29,  2.34 12/27/16: 0.54, 0.29, 1.86   24 hr urine PEP (total protein, M-spike) 10/13/2014: (-) 02/12/2016: 282m, 672m9/22/2017: 114 mg, (-) 08/08/16: 99, (-) 12/27/16: 5973m(-)  PATHOLOGY REPORT   Diagnosis 01/24/2016 Bone Marrow, Aspirate,Biopsy, and Clot, RT iliac and core - VARIABLY CELLULAR BONE MARROW WITH PLASMA CELL NEOPLASM. - SEE COMMENT. PERIPHERAL BLOOD: - MACROCYTIC ANEMIA. Diagnosis Note The bone marrow is variably cellular with trilineage hematopoiesis and non specific myeloid changes. In this background, the plasma cell component is variable ranging from less than 10 % in many areas to 30% in some aspirate particles with relatively low cellularity. Foci of interstitial infiltrates and small clusters are also seen in the clot section. Immunohistochemical stains show kappa light chain restriction in atypical plasma cell clusters and infiltrates consistent with plasma cell neoplasm. Correlation with cytogenetic and FISH studies is recommended. (BNS:gt, 7/107/20/2017Diagnosis 03/28/16 PLEURAL FLUID, LEFT (SPECIMEN 1 OF 1 COLLECTED 03/28/16): REACTIVE MESOTHELIAL CELLS  RADIOGRAPHIC STUDIES:   Echo 12/03/16 -Left Ventricle: Systolic function was   mildly to moderately reduced. The estimated ejection fraction was   in the range of 40% to 45%. Diffuse hypokinesis. Doppler   parameters are consistent with abnormal left ventricular   relaxation (grade 1 diastolic dysfunction). Doppler parameters   are consistent with indeterminate ventricular filling pressure. - Left atrium: The atrium was severely dilated. - Tricuspid valve: There was trivial regurgitation. - Pulmonary arteries: Systolic pressure was within the normal   range. PA peak pressure: 33 mm Hg (S). - Global longitudinal strain -10.5% (abnormal)  Echo 08/26/2016 - Left ventricle: LVEF is approximately 35% with diffuse diffuse   hypokinesis with severe hypokinesis/ akinesis of the   inferior/inferoseptal walls. In  comparison to echo images from   December 2017, there does not appear to be significant change The   cavity size was normal. Wall thickness was normal.  Doppler   parameters are consistent with abnormal left ventricular   relaxation (grade 1 diastolic dysfunction). - Aortic valve: AV is thickened. 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 Present in echo from December 2017 There was   trivial regurgitation. - Right ventricle: Systolic function was mildly reduced. - Right atrium: The atrium was mildly dilated. - Pericardium, extracardiac: A trivial pericardial effusion was   identified.  ASSESSMENT: Shannon Rush 76 y.o. female with a history of Multiple Myeloma s/p autotransplant in August 2014 followed by maintenence low dose Revlimid therapy, and relapsed in 01/2016  PLAN:   1. IgG kappa Multiple myeloma s/p Auto SCT, intermediate risk, relapsed in 01/2016 --MM markers demonstrate a very good initial response (M protein baseline 2.6, down to 0.18 after induction chemo and transplant, stable around 0.5 since July 2015, but has gradually increased lately),  she unfortunately relapsed in July 2017 -I previously discussed her repeated bone marrow biopsy from 02/01/2016, which showed 10-30% plasma cells in her marrow -I agree with Dr. Kendell Bane recommendation to stop revlimid and change to Daratumumab, Pomalidomide and dexamethasone due to her disease relapse. I don't think she has resistance to Revlimid, she has been on very small dose of Revlimid due to her prior history of multiple infections. -She started first cycle DaraPD in 02/2016, but in 2 weeks she developed severe pancytopenia, pneumonia with sepsis required 3 weeks hospital stay. She did have a very nice response to the treatment, her M-protein has significantly reduced from 1.2 to 0.2, light chain levels normalized even with half cycle treatment  -I recommended her to  change regiment to DaraVD, and started her on dara every 2 weeks for 2 doses, she tolerated this very well -she has had good response to treatment, M-protein significantly dropped, will get dara-specific IFE next time to see if she still has M-protein  -Her previous echo in December 2017 showed a significant drop on EF, she is not much symptomatically for CHF, due to the potential contribution of Velcade to her CHF, I'll hold her Velcade for now. Her repeated echo in May 2018 showed a significant improvement, EF 40-45%. -She is clinically doing very well, and has had good response to treatment. We'll continue Dara and dexa 64m every 4 weeks. -her dara-specific IFE in 12/2016 was still positive, she has low level M-protein, she has achieved a good partial response. -She hasn't had Velcade since December 2017. Will continue holding it for now due to her CHF -Labs reviewed and today Hg 11.1, improved, M Spike is still positive but low levels. Will continue monthly Dara treatment.  -We discussed continuing DDevoltreatment as maintenance therapy.   2. Pancytopenia  -secondary to chemo  - mild anemia stable, neutropenia and some cytopenia result -Continue monitoring closely  3. CHF, CAD, inferior STEMI in 02/2014 -continue metoprolol and lipitor, Dr. NMeda Coffeepreviously started her on lisinopril also -previously followed up with Dr. NMeda Coffee -her previous echo on 08/26/16 showed improvement with EF 30-35% from 25-30%, further improved to 40-45%, echo in May 2018 -Dr. NMeda Coffeeplan to repeat her echo every 3 months when she is on treatment  4. Osteoporosis -previous bone density scan showed worsening osteoporosis, continue zometa, changed to every 3 months after 03/2015.  -she declined repeating DEXA  -she has been on zometa for 4 years, the risk is probably overweight the benefit at this point, we have stopped it    Plan -Refill dexamethasone  -  Reviewed labs, they are adequate for her to continue    -Dara today and continue every 4 weeks She take dexa 40m the day before dara  -Lab, flush, f/u and daratumumab in 5 weeks (postpone for 1 week due to her travel)   All questions were answered. The patient knows to call the clinic with any problems, questions or concerns. We can certainly see the patient much sooner if necessary.  I spent 20 minutes counseling the patient face to face. The total time spent in the appointment was 25 minutes.   FTruitt Merle 01/24/2017   This document serves as a record of services personally performed by YTruitt Merle MD. It was created on her behalf by AJoslyn Devon a trained medical scribe. The creation of this record is based on the scribe's personal observations and the provider's statements to them. This document has been checked and approved by the attending provider.

## 2017-01-23 ENCOUNTER — Other Ambulatory Visit: Payer: Self-pay | Admitting: *Deleted

## 2017-01-23 DIAGNOSIS — I429 Cardiomyopathy, unspecified: Secondary | ICD-10-CM

## 2017-01-23 DIAGNOSIS — Z7969 Long term (current) use of other immunomodulators and immunosuppressants: Secondary | ICD-10-CM

## 2017-01-23 DIAGNOSIS — Z79899 Other long term (current) drug therapy: Secondary | ICD-10-CM

## 2017-01-23 MED ORDER — METOPROLOL SUCCINATE ER 25 MG PO TB24: 25.0000 mg | ORAL_TABLET | Freq: Every day | ORAL | 3 refills | Status: DC

## 2017-01-23 MED ORDER — LISINOPRIL 2.5 MG PO TABS: 2.5000 mg | ORAL_TABLET | Freq: Every day | ORAL | 3 refills | Status: DC

## 2017-01-24 ENCOUNTER — Encounter: Payer: Self-pay | Admitting: General Practice

## 2017-01-24 ENCOUNTER — Ambulatory Visit (HOSPITAL_BASED_OUTPATIENT_CLINIC_OR_DEPARTMENT_OTHER): Payer: Medicare Other | Admitting: Hematology

## 2017-01-24 ENCOUNTER — Ambulatory Visit (HOSPITAL_BASED_OUTPATIENT_CLINIC_OR_DEPARTMENT_OTHER): Payer: Medicare Other

## 2017-01-24 ENCOUNTER — Other Ambulatory Visit (HOSPITAL_BASED_OUTPATIENT_CLINIC_OR_DEPARTMENT_OTHER): Payer: Medicare Other

## 2017-01-24 VITALS — BP 103/63 | HR 62 | Temp 97.9°F | Resp 17

## 2017-01-24 VITALS — BP 138/59 | HR 75 | Temp 97.9°F | Resp 18 | Ht 66.0 in | Wt 122.0 lb

## 2017-01-24 DIAGNOSIS — D61810 Antineoplastic chemotherapy induced pancytopenia: Secondary | ICD-10-CM | POA: Diagnosis not present

## 2017-01-24 DIAGNOSIS — D649 Anemia, unspecified: Secondary | ICD-10-CM | POA: Diagnosis not present

## 2017-01-24 DIAGNOSIS — D709 Neutropenia, unspecified: Secondary | ICD-10-CM

## 2017-01-24 DIAGNOSIS — M810 Age-related osteoporosis without current pathological fracture: Secondary | ICD-10-CM | POA: Diagnosis not present

## 2017-01-24 DIAGNOSIS — C9002 Multiple myeloma in relapse: Secondary | ICD-10-CM

## 2017-01-24 DIAGNOSIS — D6481 Anemia due to antineoplastic chemotherapy: Secondary | ICD-10-CM

## 2017-01-24 DIAGNOSIS — C9000 Multiple myeloma not having achieved remission: Secondary | ICD-10-CM

## 2017-01-24 DIAGNOSIS — M818 Other osteoporosis without current pathological fracture: Secondary | ICD-10-CM

## 2017-01-24 DIAGNOSIS — I509 Heart failure, unspecified: Secondary | ICD-10-CM | POA: Diagnosis not present

## 2017-01-24 DIAGNOSIS — Z5112 Encounter for antineoplastic immunotherapy: Secondary | ICD-10-CM

## 2017-01-24 DIAGNOSIS — T451X5A Adverse effect of antineoplastic and immunosuppressive drugs, initial encounter: Secondary | ICD-10-CM

## 2017-01-24 DIAGNOSIS — R05 Cough: Secondary | ICD-10-CM | POA: Diagnosis not present

## 2017-01-24 LAB — CBC WITH DIFFERENTIAL/PLATELET
BASO%: 0.3 % (ref 0.0–2.0)
Basophils Absolute: 0 10e3/uL (ref 0.0–0.1)
EOS%: 0.1 % (ref 0.0–7.0)
Eosinophils Absolute: 0 10e3/uL (ref 0.0–0.5)
HCT: 32.6 % — ABNORMAL LOW (ref 34.8–46.6)
HGB: 11.1 g/dL — ABNORMAL LOW (ref 11.6–15.9)
LYMPH%: 3.7 % — ABNORMAL LOW (ref 14.0–49.7)
MCH: 36.7 pg — ABNORMAL HIGH (ref 25.1–34.0)
MCHC: 34 g/dL (ref 31.5–36.0)
MCV: 107.8 fL — ABNORMAL HIGH (ref 79.5–101.0)
MONO#: 0 10e3/uL — ABNORMAL LOW (ref 0.1–0.9)
MONO%: 0.6 % (ref 0.0–14.0)
NEUT#: 3.4 10e3/uL (ref 1.5–6.5)
NEUT%: 95.3 % — ABNORMAL HIGH (ref 38.4–76.8)
Platelets: 175 10e3/uL (ref 145–400)
RBC: 3.02 10e6/uL — ABNORMAL LOW (ref 3.70–5.45)
RDW: 15 % — ABNORMAL HIGH (ref 11.2–14.5)
WBC: 3.6 10e3/uL — ABNORMAL LOW (ref 3.9–10.3)
lymph#: 0.1 10e3/uL — ABNORMAL LOW (ref 0.9–3.3)

## 2017-01-24 LAB — COMPREHENSIVE METABOLIC PANEL
ALBUMIN: 3.9 g/dL (ref 3.5–5.0)
ALK PHOS: 51 U/L (ref 40–150)
ALT: 17 U/L (ref 0–55)
AST: 18 U/L (ref 5–34)
Anion Gap: 13 mEq/L — ABNORMAL HIGH (ref 3–11)
BUN: 22.3 mg/dL (ref 7.0–26.0)
CALCIUM: 9.4 mg/dL (ref 8.4–10.4)
CHLORIDE: 108 meq/L (ref 98–109)
CO2: 21 mEq/L — ABNORMAL LOW (ref 22–29)
Creatinine: 0.9 mg/dL (ref 0.6–1.1)
EGFR: 64 mL/min/{1.73_m2} — AB (ref >90)
GLUCOSE: 234 mg/dL — AB (ref 70–140)
POTASSIUM: 3.9 meq/L (ref 3.5–5.1)
SODIUM: 142 meq/L (ref 136–145)
Total Bilirubin: 0.71 mg/dL (ref 0.20–1.20)
Total Protein: 6.2 g/dL — ABNORMAL LOW (ref 6.4–8.3)

## 2017-01-24 MED ORDER — MONTELUKAST SODIUM 10 MG PO TABS
10.0000 mg | ORAL_TABLET | Freq: Once | ORAL | Status: AC
  Administered 2017-01-24: 10 mg via ORAL

## 2017-01-24 MED ORDER — ACETAMINOPHEN 325 MG PO TABS
650.0000 mg | ORAL_TABLET | Freq: Once | ORAL | Status: AC
  Administered 2017-01-24: 650 mg via ORAL

## 2017-01-24 MED ORDER — METHYLPREDNISOLONE SODIUM SUCC 125 MG IJ SOLR
125.0000 mg | Freq: Once | INTRAMUSCULAR | Status: AC
  Administered 2017-01-24: 125 mg via INTRAVENOUS

## 2017-01-24 MED ORDER — DIPHENHYDRAMINE HCL 25 MG PO CAPS
ORAL_CAPSULE | ORAL | Status: AC
Start: 2017-01-24 — End: 2017-01-24
  Filled 2017-01-24: qty 2

## 2017-01-24 MED ORDER — METHYLPREDNISOLONE SODIUM SUCC 125 MG IJ SOLR
INTRAMUSCULAR | Status: AC
  Filled 2017-01-24: qty 2

## 2017-01-24 MED ORDER — DEXAMETHASONE 4 MG PO TABS: ORAL_TABLET | ORAL | 1 refills | Status: DC

## 2017-01-24 MED ORDER — PROCHLORPERAZINE MALEATE 10 MG PO TABS
10.0000 mg | ORAL_TABLET | Freq: Once | ORAL | Status: AC
  Administered 2017-01-24: 10 mg via ORAL

## 2017-01-24 MED ORDER — SODIUM CHLORIDE 0.9 % IV SOLN
16.0000 mg/kg | Freq: Once | INTRAVENOUS | Status: AC
  Administered 2017-01-24: 900 mg via INTRAVENOUS
  Filled 2017-01-24: qty 5

## 2017-01-24 MED ORDER — SODIUM CHLORIDE 0.9 % IV SOLN
Freq: Once | INTRAVENOUS | Status: AC
  Administered 2017-01-24: 11:00:00 via INTRAVENOUS

## 2017-01-24 MED ORDER — ACETAMINOPHEN 325 MG PO TABS
ORAL_TABLET | ORAL | Status: AC
  Filled 2017-01-24: qty 2

## 2017-01-24 MED ORDER — PROCHLORPERAZINE MALEATE 10 MG PO TABS
ORAL_TABLET | ORAL | Status: AC
Start: 2017-01-24 — End: 2017-01-24
  Filled 2017-01-24: qty 1

## 2017-01-24 MED ORDER — MONTELUKAST SODIUM 10 MG PO TABS
ORAL_TABLET | ORAL | Status: AC
  Filled 2017-01-24: qty 1

## 2017-01-24 MED ORDER — DIPHENHYDRAMINE HCL 25 MG PO CAPS
50.0000 mg | ORAL_CAPSULE | Freq: Once | ORAL | Status: AC
  Administered 2017-01-24: 50 mg via ORAL

## 2017-01-24 NOTE — Patient Instructions (Signed)
[facility] Discharge Instructions for Patients Receiving Chemotherapy  Today you received the following chemotherapy agents:  Daratumumab (Darzalex)  To help prevent nausea and vomiting after your treatment, we encourage you to take your nausea medication as prescribed.   If you develop nausea and vomiting that is not controlled by your nausea medication, call the clinic.   BELOW ARE SYMPTOMS THAT SHOULD BE REPORTED IMMEDIATELY:  *FEVER GREATER THAN 100.5 F  *CHILLS WITH OR WITHOUT FEVER  NAUSEA AND VOMITING THAT IS NOT CONTROLLED WITH YOUR NAUSEA MEDICATION  *UNUSUAL SHORTNESS OF BREATH  *UNUSUAL BRUISING OR BLEEDING  TENDERNESS IN MOUTH AND THROAT WITH OR WITHOUT PRESENCE OF ULCERS  *URINARY PROBLEMS  *BOWEL PROBLEMS  UNUSUAL RASH Items with * indicate a potential emergency and should be followed up as soon as possible.  Feel free to call the clinic you have any questions or concerns. The clinic phone number is [phone].  Please show the CHEMO ALERT CARD at check-in to the Emergency Department and triage nurse.   

## 2017-01-24 NOTE — Progress Notes (Signed)
[place] Spiritual Care Note  Followed up in [place] in infusion, having a lively conversation about family, events, and social issues of deep interest to her.  She values Spiritual Care encounters as opportunities for meaning-making and encouragement.  Overall she is in good spirits and living life fully; per pt, tx is minor and barely slows her down.   [place], [place], [facility] Pager [phone] Voicemail [phone]

## 2017-01-26 ENCOUNTER — Encounter: Payer: Self-pay | Admitting: Hematology

## 2017-01-30 LAB — PROTEIN ELECTROPHORESIS, SERUM
A/G Ratio: 2.1 — ABNORMAL HIGH (ref 0.7–1.7)
ALBUMIN: 4 g/dL (ref 2.9–4.4)
ALPHA 1: 0.2 g/dL (ref 0.0–0.4)
Alpha 2: 0.6 g/dL (ref 0.4–1.0)
Beta: 0.8 g/dL (ref 0.7–1.3)
Gamma Globulin: 0.3 g/dL — ABNORMAL LOW (ref 0.4–1.8)
Globulin, Total: 1.9 g/dL — ABNORMAL LOW (ref 2.2–3.9)
M-Spike, %: 0.1 g/dL — ABNORMAL HIGH
TOTAL PROTEIN: 5.9 g/dL — AB (ref 6.0–8.5)

## 2017-01-31 LAB — IFE, DARA-SPECIFIC, SERUM
IGA/IMMUNOGLOBULIN A, SERUM: 8 mg/dL — AB (ref 64–422)
IGM (IMMUNOGLOBIN M), SRM: 13 mg/dL — AB (ref 26–217)
IgG, Qn, Serum: 332 mg/dL — ABNORMAL LOW (ref 700–1600)

## 2017-02-21 NOTE — Progress Notes (Signed)
[facility] PROGRESS NOTE  PCP: Shannon Resides, MD [place] [address]  DIAGNOSIS: Multiple myeloma with relapse  CHIEF COMPLAINTS: Follow-up of multiple myeloma  Oncology History   Multiple myeloma ([place])   Staging form: Multiple Myeloma, AJCC 6th Edition   - Clinical: Stage IIIA - Signed by [name], MD on 09/04/2013      Multiple myeloma ([place])   05/26/2012 Initial Diagnosis    Presenting IgG was 4,040 mg/dL on 06/05/2012 (IgA 35; IgM 34); kappa free light chain 34.4 mg/dL, lambda 0.00, kappa:lambda ratio of 34.75; SPEP with M-spike of 2.60.      06/30/2012 Imaging    Numerous lytic lesions throughout the calvarium.  Lytic lesion within the left lateral scapula.  Findings most compatible with metastases or myeloma. Multiple mid and lower thoracic compression fractures.  Slight compression through the endplates at L3.      85/88/5027 Bone Marrow Biopsy    Bone marrow biopsy showed 49% plasma cell. Normal classical cytogenetics; however, myeloma FISH panel showed 13q- (intermediate risk)          07/14/2012 - 07/27/2012 Radiation Therapy    Palliative radiation 20 Gy over 10 fractions between to thoracic spine cord compression and symptomatic left scapula lytic lesion.      08/10/2012 - 11/2012 Chemotherapy    Started SQ Velcade once weekly, 3 weeks on, 1 weeks off; daily Revlimid d1-21, 7 days off; and Dexamethasone 859m PO weekly.       02/12/2013 Bone Marrow Transplant    [facility]       06/14/2013 Tumor Marker    IgG  1830,  Kappa:lamba ratio, 1.69 (baseline was 34.75 or   M-spike 0.28 (baseline of 2.6 or  89.3% of baseline).        06/25/2013 -  Chemotherapy    Started zometa 3.5 mg monthly       09/01/2013 - 01/2014 Chemotherapy    Maintenance therapy with revlimid 511mdaily for 14 days and then off for 7 (decreased from 21 days on and 7 days off based on neutropenia). Stopped due  to disease progression 01/2017      09/13/2013 - 09/17/2014 Hospital Admission    Hospital admission for pneumonia      12/20/2013 Treatment Plan Change    Maintenance therapy decreased to 2.5 mg daily for 21 days and then off for 7 days based on low counts.       01/25/2014 Tumor Marker    IgG 1360 M spike 0.5      03/01/2014 - 03/05/2014 [facility] Ad[facility] received thromb[facility] therapy, cath showed 60% prox RCA, mid-distal RCA 50%, Echo normal, no stents.      04/11/2014 - 08/07/2014 Chemotherapy    Continue Revlimid at 2.5 mg 3 weeks on 1 week off      08/08/2014 - 08/13/2014 Hospital Admission    Hospital admission for pneumonia. Her Revlimid was held.      08/29/2014 - 09/14/2014 Chemotherapy    Maintenance Revlimid restarted      09/14/2014 - 09/17/2014 Hospital Admission    [place]      11/13/2014 - 12/25/2015 Chemotherapy    Maintenance Revlimid restarted, changed to 2.59m40maily, 2 weks on, 1 week off from 12/15/2014, stopped due to disease progression  01/24/2016 Progression    Patient is M protein has gradually increased to 1.0g, repeated a bone marrow biopsy showed plasma cell 10-30%      02/27/2016 - 03/07/2016 Chemotherapy    Pomalidomide 4 mg daily on day 1-21, dexamethasone 40 mg on day 1, 8 and 15, every 28 days, started on 02/27/2016, daratumumab weekly started on 03/07/2016, held after first dose, due to hospitalization and a severe pancytopenia, pomalidomide was stopped afterwards       03/10/2016 - 03/29/2016 Hospital Admission    Pt was admitted for sepsis from pneumonia, and severe pancytopenia. She required ICU stay for a few days due to hypotension, developed b/l pleural effusion required diuretics and b/l thoracentesis. She also required blood and plt transfusion, and prolonged neupogen injection for severe  neutropenia. She was discharged home after 3 weeks hospital stay       04/26/2016 -  Chemotherapy    Daratumumab restarted on 04/26/2016, Velcade 1.61m/m2 and dexa 235mweekly start on 11/3, velcade held since 06/28/2016 due to CHF       08/26/2016 Echocardiogram    - Left ventricle: LVEF is approximately 35% with diffuse diffuse   hypokinesis with severe hypokinesis/ akinesis of the   inferior/inferoseptal walls. In comparison to echo images from   December 2017, there does not appear to be significant change The   cavity size was normal. Wall thickness was normal. Doppler   parameters are consistent with abnormal left ventricular   relaxation (grade 1 diastolic dysfunction). - Aortic valve: AV is thickened. 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 Present in echo from December 2017 There was   trivial regurgitation. - Right ventricle: Systolic function was mildly reduced. - Right atrium: The atrium was mildly dilated. - Pericardium, extracardiac: A trivial pericardial effusion was   identified.      12/03/2016 Echocardiogram    -Left Ventricle: Systolic function was   mildly to moderately reduced. The estimated ejection fraction was   in the range of 40% to 45%. Diffuse hypokinesis. Doppler   parameters are consistent with abnormal left ventricular   relaxation (grade 1 diastolic dysfunction). Doppler parameters   are consistent with indeterminate ventricular filling pressure. - Left atrium: The atrium was severely dilated. - Tricuspid valve: There was trivial regurgitation. - Pulmonary arteries: Systolic pressure was within the normal   range. PA peak pressure: 33 mm Hg (S). - Global longitudinal strain -10.5% (abnormal)        CURRENT TREATMENT:   1. Pomalidomide 4 mg daily on day 1-21, dexamethasone 40 mg on day 1, 8 and 15, every 28 days, started on 02/27/2016, daratumumab weekly started on 03/07/2016,  treatment on hold since 03/09/2016 due to hospitalization and severe cytopenia 2. Daratumumab restarted on 04/26/2016, Velcade 1.46m21m2 and dexa 99m51mekly start on 11/3, velcade held since 06/28/2016 due to CHF    INTERVAL HISTORY:  Shannon GREENLEAFy76. female with a past medical history of MM as detailed above, s/p SCT is here for follow-up. Overall she is doing well. She was recently on vacation which she enjoyed. She has been experiencing some neuropathy previously to her feet, it has remained unchanged since beginning treatment. This is mostly numbness and some paraesthesias. She reports that this is worse with ambulation and it is worse at night prior to going to bed. She was able to tolerate her recent course of Dexamethasone well.   On review of system, pt denies  any change from baseline. She reports continued neuropathy to her feet.    MEDICAL HISTORY: Past Medical History:  Diagnosis Date  . CAD (coronary artery disease)    a. inf STEMI (in [place] >>> [facility]) >>> LHC (8/15- [place]):  pRCA 60%, mid to dist RCA 50% >>> med rx  . Cord compression ([place]) 07/13/12   MRI- diffuse myeloma involvement of T-L spine  . History of radiation therapy 07/13/12-07/27/12   spinal cord compression T3-T10,left scapula  . Hx of echocardiogram    Echo (03/02/14-done at [facility]):  Normal LVEF without significant regional wall motion abnormalities. Mild   . Multiple myeloma ([place]) 07/01/2012  . OSTEOPOROSIS 06/11/2010   Multiple compression fractures; and spontaneous fracture of sternum Qualifier: Diagnosis of  By: [name], [name]     . Thoracic kyphosis 07/13/12   per MRI scan  . Unspecified deficiency anemia    ALLERGIES:  is allergic to zosyn [piperacillin sod-tazobactam so]; levaquin [levofloxacin in d5w]; other; piperacillin-tazobactam in dex; zithromax [azithromycin]; and ciprofloxacin.  SURGICAL HISTORY:  Past Surgical History:  Procedure  Laterality Date  . APPENDECTOMY    . CESAREAN SECTION     x2   . COLONOSCOPY  2007   neg with Dr. [name]  . ELBOW SURGERY    . HIP SURGERY  2009   left  . TUBAL LIGATION     REVIEW OF SYSTEMS:  Constitutional: Denies abnormal weight loss Eyes: Denies blurriness of vision Ears, nose, mouth, throat, and face: Denies mucositis or sore throat,  Respiratory: Denies dyspnea or wheezes Cardiovascular: Denies palpitation, chest discomfort (+)right leg swelling Gastrointestinal:  Denies nausea, heartburn or change in bowel habits Skin: Denies abnormal skin rashes Lymphatics: Denies new lymphadenopathy or easy bruising Neurological:Denies new numbness, tingling or new weaknesses. Positive for paraesthesias and numbness to the feet (baseline).  Behavioral/Psych: Mood is stable, no new changes   All other systems were reviewed with the patient and are negative.   PHYSICAL EXAMINATION:  ECOG PERFORMANCE STATUS: 1 BP 126/66 (BP Location: Left Arm, Patient Position: Sitting)   Pulse 64   Temp 98 F (36.7 C) (Oral)   Resp 18   Ht 5' 6" (1.676 m)   Wt 123 lb 8 oz (56 kg)   SpO2 100%   BMI 19.93 kg/m   GENERAL:alert, no distress and comfortable; easily mobile to exam table; kyphosis, moderate.  SKIN: skin color, texture, turgor are normal, no rashes or significant lesions except a small healing, dry wound in the right lower leg above his ankle, with surrounding skin erythema and swelling EYES: normal, Conjunctiva are pink, 1 tear/injectionof blood vessel left eye in sclera. OROPHARYNX:no exudate, no erythema and lips, buccal mucosa, and tongue normal, no ONJ NECK: supple, thyroid normal size, non-tender, without nodularity; nodules cervical bilaterally.  LYMPH:  no palpable lymphadenopathy in the cervical, axillary or supraclavicular LUNGS: crackles at the bases bilaterally with normal breathing effort, no wheezes or rhonchi HEART: regular rate & rhythm and no murmurs  ABDOMEN:abdomen soft,  and normal bowel sounds Musculoskeletal:no cyanosis of digits and no clubbing, NEURO: alert & oriented x 3 with fluent speech, no focal motor/sensory deficits   Labs:  CBC Latest Ref Rng & Units 02/28/2017 01/24/2017 12/27/2016  WBC 3.9 - 10.3 10e3/uL 5.8 3.6(L) 4.0  Hemoglobin 11.6 - 15.9 g/dL 9.8(L) 11.1(L) 10.6(L)  Hematocrit 34.8 - 46.6 % 28.8(L) 32.6(L) 30.8(L)  Platelets 145 - 400 10e3/uL 153 175 171    CMP  Latest Ref Rng & Units 02/28/2017 2017-01-31 2017-01-31  Glucose 70 - 140 mg/dl 99 234(H) -  BUN 7.0 - 26.0 mg/dL 30.0(H) 22.3 -  Creatinine 0.6 - 1.1 mg/dL 0.8 0.9 -  Sodium 136 - 145 mEq/L 138 142 -  Potassium 3.5 - 5.1 mEq/L 4.1 3.9 -  Chloride 101 - 111 mmol/L - - -  CO2 22 - 29 mEq/L 23 21(L) -  Calcium 8.4 - 10.4 mg/dL 9.8 9.4 -  Total Protein 6.4 - 8.3 g/dL 5.9(L) 6.2(L) 5.9(L)  Total Bilirubin 0.20 - 1.20 mg/dL 0.67 0.71 -  Alkaline Phos 40 - 150 U/L 47 51 -  AST 5 - 34 U/L 18 18 -  ALT 0 - 55 U/L 15 17 -   SPEP M-PROTEIN 10/01/2013: 0.18 (nadir)  10/13/2014: 0.6 11/21/2014: 0.5 01/30/2015: 0.6 03/13/2015: 0.6 04/20/2015: 0.5 06/01/2015: 0.5 07/20/2015: 0.7 09/14/2015: 0.7 10/26/2015: 0.8 12/07/2015: 1.0 02/07/2016: 1.1  04/03/2016: 0.2 05/03/2016: 0.2  05/31/2016: 0.1 06/28/2016: 0.1 07/25/2016: 0.1 08/21/2016: 0.1 09/27/2016: 0.1 10/23/16: 0.1 12/27/2016: 0.1 (Dara Specific IFE was still positive) 01/31/2017: 0.1  IgG ([phone]) 10/13/2014: 1710 11/21/2014: 1460 01/30/2015: 1380 03/13/15: 1570 04/20/2015: 1550 06/01/2015: 1590 07/22/2015: 1770 09/14/2015: 3016 10/26/2015: 1898 12/07/2015: 2045 02/07/2016: 1883 04/03/2016: 787 05/03/2016: 685  05/31/2016: 544 06/28/2016: 463 07/25/2016: 455 08/21/2016: 426 09/27/2016: 438 10/23/16: 374 12/06/16: 370 12/27/16: 343, 346 Jan 31, 2017: 332   Serum kappa, lambda light chains, and ratio  11/21/2014: 3.95, 2.08, 1.90  01/30/2015: 3.92, 2.64, 1.48 03/13/2015: 3.55, 2.17, 1.64 04/20/15: 4.52, 1.78, 2.54 06/01/2015: 4.82, 1.75,  2.75 07/20/2015: 8.71, 2.43, 3.58 09/14/2015: 9.17, 2.02, 4.55 10/26/2015: 11.4, 1.75, 6.42 12/07/2015: 1.54, 1.83, 8.44 02/07/2016: 1.93, 1.42, 13.6 04/03/2016: 0.96, 0.95, 1.01  05/03/2016: 0.71, 0.34, 2.09  05/31/2016: 0.48, 0.26, 1.85 06/28/2016: 0.52, 0.60, 0.87 07/25/2016: 0.46, 0.27, 1.70 08/21/2016: 0.43, 0.27, 1.59 09/27/2016: 0.52, 0.24, 2.17 10/23/16: .0.49, 0.35, 1.40 12/06/16: 0.68, 0.29, 2.34 12/27/16: 0.54, 0.29, 1.86  24 hr urine PEP (total protein, M-spike) 10/13/2014: (-) 02/12/2016: 271m, 656m9/22/2017: 114 mg, (-) 08/08/16: 99, (-) 12/27/16: 5921m(-)  PATHOLOGY REPORT  Diagnosis 01/24/2016 Bone Marrow, Aspirate,Biopsy, and Clot, RT iliac and core - VARIABLY CELLULAR BONE MARROW WITH PLASMA CELL NEOPLASM. - SEE COMMENT. PERIPHERAL BLOOD: - MACROCYTIC ANEMIA.  Diagnosis Note The bone marrow is variably cellular with trilineage hematopoiesis and non specific myeloid changes. In this background, the plasma cell component is variable ranging from less than 10 % in many areas to 30% in some aspirate particles with relatively low cellularity. Foci of interstitial infiltrates and small clusters are also seen in the clot section. Immunohistochemical stains show kappa light chain restriction in atypical plasma cell clusters and infiltrates consistent with plasma cell neoplasm. Correlation with cytogenetic and FISH studies is recommended. (BNS:gt, 7/12017-07-20Diagnosis 03/28/16 PLEURAL FLUID, LEFT (SPECIMEN 1 OF 1 COLLECTED 03/28/16): REACTIVE MESOTHELIAL CELLS  RADIOGRAPHIC STUDIES:  Echo 12/03/16 -Left Ventricle: Systolic function was   mildly to moderately reduced. The estimated ejection fraction was   in the range of 40% to 45%. Diffuse hypokinesis. Doppler  parameters are consistent with abnormal left ventricular relaxation (grade 1 diastolic dysfunction). Doppler parameters are consistent with indeterminate ventricular filling pressure. - Left atrium: The atrium was severely  dilated. - Tricuspid valve: There was trivial regurgitation. - Pulmonary arteries: Systolic pressure was within the normal   range. PA peak pressure: 33 mm Hg (S). - Global longitudinal strain -10.5% (abnormal)  Echo 08/26/2016 - Left ventricle: LVEF is approximately 35% with diffuse diffuse hypokinesis with severe  hypokinesis/ akinesis of the inferior/inferoseptal walls. In comparison to echo images from December 2017, there does not appear to be significant change The cavity size was normal. Wall thickness was normal. Doppler parameters are consistent with abnormal left ventricular relaxation (grade 1 diastolic dysfunction). - Aortic valve: AV is thickened. There is a small mobile   echodenisity on ventricular surface consistent with possible fibroelastoma. Present in echo from December 2017 There was trivial regurgitation. - Right ventricle: Systolic function was mildly reduced. - Right atrium: The atrium was mildly dilated. - Pericardium, extracardiac: A trivial pericardial effusion was identified.  ASSESSMENT: Shannon Rush 76 y.o. female with a history of Multiple Myeloma s/p autotransplant in August 2014 followed by maintenence low dose Revlimid therapy, and relapsed in 01/2016  PLAN:   1. IgG kappa Multiple myeloma s/p Auto SCT, intermediate risk, relapsed in 01/2016 --MM markers demonstrate a very good initial response (M protein baseline 2.6, down to 0.18 after induction chemo and transplant, stable around 0.5 since July 2015, but has gradually increased lately),  she unfortunately relapsed in July 2017 -I previously discussed her repeated bone marrow biopsy from 02/01/2016, which showed 10-30% plasma cells in her marrow -I agree with Dr. Kendell Bane recommendation to stop revlimid and change to Daratumumab, Pomalidomide and dexamethasone due to her disease relapse. I don't think she has resistance to Revlimid, she has been on very small dose of Revlimid due to her prior history of  multiple infections. -She started first cycle DaraPD in 02/2016, but in 2 weeks she developed severe pancytopenia, pneumonia with sepsis required 3 weeks hospital stay. She did have a very nice response to the treatment, her M-protein has significantly reduced from 1.2 to 0.2, light chain levels normalized even with half cycle treatment  -I recommended her to change regiment to DaraVD, and started her on dara every 2 weeks for 2 doses, she tolerated this very well -she has had good response to treatment, M-protein significantly dropped, will get dara-specific IFE next time to see if she still has M-protein  -Her previous echo in December 2017 showed a significant drop on EF, she is not much symptomatically for CHF, due to the potential contribution of Velcade to her CHF, I'll hold her Velcade for now. Her repeated echo in May 2018 showed a significant improvement, EF 40-45%. -She is clinically doing very well, and has had good response to treatment. We'll continue Dara and dexa 41m every 4 weeks. -her dara-specific IFE in 12/2016 was still positive, she has low level M-protein, she has achieved a good partial response. -She hasn't had Velcade since December 2017. Will continue holding it for now due to her CHF -Labs reviewed, mild anemia stable, M Spike is still positive but low levels. Will continue monthly Dara treatment.  -We discussed continuing monthly Dara treatment as maintenance therapy.  -She has peripheral neuropathy from her previous treatment. I discussed with her that we could place her on Neurotin; however, she would not like to be placed on anymore medications.  -Hgb was mildly lower, discussed with her to begin a multivitamin. She will consider adding this to her regimen.  -[facility]   2. Pancytopenia  -secondary to chemo  - mild anemia stable, neutropenia and some cytopenia result -Continue monitoring closely.  3.  CHF, CAD, inferior STEMI in 02/2014 -continue metoprolol and lipitor, Dr. NMeda Coffeepreviously started her on lisinopril also -previously followed up with Dr. NMeda Coffee -  her previous echo on 08/26/16 showed improvement with EF 30-35% from 25-30%, further improved to 40-45%, echo in May 2018 -Dr. Meda Coffee plan to repeat her echo every 3 months when she is on treatment. -Continue with f/u and Echo at the end of this month.   4. Osteoporosis -previous bone density scan showed worsening osteoporosis, continue zometa, changed to every 3 months after 03/2015.  -she declined repeating DEXA  -she has been on zometa for 4 years, the risk is probably overweight the benefit at this point, we have stopped it   5. Peripheral neuropathy, G1-2 -Second 2 previous chemotherapy. Slightly worse lately. -We discussed the role of Neurontin, she declined.  6. Goal of care discussion  -We again discussed the incurable nature of her cancer, and the overall poor prognosis, especially if she does not have good response to chemotherapy or progress on chemo -The patient understands the goal of care is palliative. -she is full code now    Plan -Labs reviewed, Hgb is mildly low but overall labs are stable. Will proceed dara infusion today  -Lab, f/u, and Dara in 4wks.   All questions were answered. The patient knows to call the clinic with any problems, questions or concerns. We can certainly see the patient much sooner if necessary.  I spent 20 minutes counseling the patient face to face. The total time spent in the appointment was 25 minutes.  This document serves as a record of services personally performed by [name], MD. It was created on her behalf by Reola Mosher, a trained medical scribe. The creation of this record is based on the scribe's personal observations and the provider's statements to them. This document has been checked and approved by the attending provider.    [name]  02/28/2017

## 2017-02-28 ENCOUNTER — Encounter: Payer: Self-pay | Admitting: Hematology

## 2017-02-28 ENCOUNTER — Telehealth: Payer: Self-pay | Admitting: Hematology

## 2017-02-28 ENCOUNTER — Ambulatory Visit (HOSPITAL_BASED_OUTPATIENT_CLINIC_OR_DEPARTMENT_OTHER): Payer: Medicare Other

## 2017-02-28 ENCOUNTER — Ambulatory Visit (HOSPITAL_BASED_OUTPATIENT_CLINIC_OR_DEPARTMENT_OTHER): Payer: Medicare Other | Admitting: Hematology

## 2017-02-28 ENCOUNTER — Other Ambulatory Visit (HOSPITAL_BASED_OUTPATIENT_CLINIC_OR_DEPARTMENT_OTHER): Payer: Medicare Other

## 2017-02-28 VITALS — BP 114/62 | HR 66 | Temp 98.4°F | Resp 18

## 2017-02-28 VITALS — BP 126/66 | HR 64 | Temp 98.0°F | Resp 18 | Ht 66.0 in | Wt 123.5 lb

## 2017-02-28 DIAGNOSIS — D61818 Other pancytopenia: Secondary | ICD-10-CM

## 2017-02-28 DIAGNOSIS — C9000 Multiple myeloma not having achieved remission: Secondary | ICD-10-CM

## 2017-02-28 DIAGNOSIS — M810 Age-related osteoporosis without current pathological fracture: Secondary | ICD-10-CM

## 2017-02-28 DIAGNOSIS — C9002 Multiple myeloma in relapse: Secondary | ICD-10-CM

## 2017-02-28 DIAGNOSIS — Z5112 Encounter for antineoplastic immunotherapy: Secondary | ICD-10-CM | POA: Diagnosis not present

## 2017-02-28 DIAGNOSIS — M818 Other osteoporosis without current pathological fracture: Secondary | ICD-10-CM

## 2017-02-28 DIAGNOSIS — D61810 Antineoplastic chemotherapy induced pancytopenia: Secondary | ICD-10-CM

## 2017-02-28 DIAGNOSIS — Z7189 Other specified counseling: Secondary | ICD-10-CM | POA: Insufficient documentation

## 2017-02-28 DIAGNOSIS — I252 Old myocardial infarction: Secondary | ICD-10-CM

## 2017-02-28 DIAGNOSIS — I509 Heart failure, unspecified: Secondary | ICD-10-CM

## 2017-02-28 LAB — CBC WITH DIFFERENTIAL/PLATELET
BASO%: 0.4 % (ref 0.0–2.0)
Basophils Absolute: 0 10*3/uL (ref 0.0–0.1)
EOS%: 0.1 % (ref 0.0–7.0)
Eosinophils Absolute: 0 10*3/uL (ref 0.0–0.5)
HCT: 28.8 % — ABNORMAL LOW (ref 34.8–46.6)
HGB: 9.8 g/dL — ABNORMAL LOW (ref 11.6–15.9)
LYMPH#: 0.6 10*3/uL — AB (ref 0.9–3.3)
LYMPH%: 9.9 % — AB (ref 14.0–49.7)
MCH: 36.7 pg — AB (ref 25.1–34.0)
MCHC: 34.2 g/dL (ref 31.5–36.0)
MCV: 107.2 fL — ABNORMAL HIGH (ref 79.5–101.0)
MONO#: 0.6 10*3/uL (ref 0.1–0.9)
MONO%: 11 % (ref 0.0–14.0)
NEUT#: 4.6 10*3/uL (ref 1.5–6.5)
NEUT%: 78.6 % — AB (ref 38.4–76.8)
Platelets: 153 10*3/uL (ref 145–400)
RBC: 2.69 10*6/uL — AB (ref 3.70–5.45)
RDW: 14.7 % — ABNORMAL HIGH (ref 11.2–14.5)
WBC: 5.8 10*3/uL (ref 3.9–10.3)

## 2017-02-28 LAB — COMPREHENSIVE METABOLIC PANEL
ALT: 15 U/L (ref 0–55)
AST: 18 U/L (ref 5–34)
Albumin: 3.9 g/dL (ref 3.5–5.0)
Alkaline Phosphatase: 47 U/L (ref 40–150)
Anion Gap: 8 mEq/L (ref 3–11)
BUN: 30 mg/dL — AB (ref 7.0–26.0)
CHLORIDE: 107 meq/L (ref 98–109)
CO2: 23 meq/L (ref 22–29)
CREATININE: 0.8 mg/dL (ref 0.6–1.1)
Calcium: 9.8 mg/dL (ref 8.4–10.4)
EGFR: 69 mL/min/{1.73_m2} — ABNORMAL LOW (ref >90)
Glucose: 99 mg/dl (ref 70–140)
POTASSIUM: 4.1 meq/L (ref 3.5–5.1)
SODIUM: 138 meq/L (ref 136–145)
Total Bilirubin: 0.67 mg/dL (ref 0.20–1.20)
Total Protein: 5.9 g/dL — ABNORMAL LOW (ref 6.4–8.3)

## 2017-02-28 MED ORDER — PROCHLORPERAZINE MALEATE 10 MG PO TABS: 10.0000 mg | ORAL_TABLET | Freq: Once | ORAL | Status: DC

## 2017-02-28 MED ORDER — ACETAMINOPHEN 325 MG PO TABS
650.0000 mg | ORAL_TABLET | Freq: Once | ORAL | Status: AC
  Administered 2017-02-28: 650 mg via ORAL

## 2017-02-28 MED ORDER — SODIUM CHLORIDE 0.9 % IV SOLN
16.0000 mg/kg | Freq: Once | INTRAVENOUS | Status: AC
  Administered 2017-02-28: 900 mg via INTRAVENOUS
  Filled 2017-02-28: qty 40

## 2017-02-28 MED ORDER — ACETAMINOPHEN 325 MG PO TABS
ORAL_TABLET | ORAL | Status: AC
  Filled 2017-02-28: qty 2

## 2017-02-28 MED ORDER — DIPHENHYDRAMINE HCL 25 MG PO CAPS
50.0000 mg | ORAL_CAPSULE | Freq: Once | ORAL | Status: AC
  Administered 2017-02-28: 50 mg via ORAL

## 2017-02-28 MED ORDER — MONTELUKAST SODIUM 10 MG PO TABS
10.0000 mg | ORAL_TABLET | Freq: Once | ORAL | Status: AC
  Administered 2017-02-28: 10 mg via ORAL

## 2017-02-28 MED ORDER — METHYLPREDNISOLONE SODIUM SUCC 125 MG IJ SOLR
125.0000 mg | Freq: Once | INTRAMUSCULAR | Status: AC
  Administered 2017-02-28: 125 mg via INTRAVENOUS

## 2017-02-28 MED ORDER — SODIUM CHLORIDE 0.9 % IV SOLN
Freq: Once | INTRAVENOUS | Status: AC
  Administered 2017-02-28: 11:00:00 via INTRAVENOUS

## 2017-02-28 MED ORDER — MONTELUKAST SODIUM 10 MG PO TABS
ORAL_TABLET | ORAL | Status: AC
  Filled 2017-02-28: qty 1

## 2017-02-28 MED ORDER — METHYLPREDNISOLONE SODIUM SUCC 125 MG IJ SOLR
INTRAMUSCULAR | Status: AC
  Filled 2017-02-28: qty 2

## 2017-02-28 MED ORDER — PROCHLORPERAZINE MALEATE 10 MG PO TABS
ORAL_TABLET | ORAL | Status: AC
  Filled 2017-02-28: qty 1

## 2017-02-28 MED ORDER — DIPHENHYDRAMINE HCL 25 MG PO CAPS
ORAL_CAPSULE | ORAL | Status: AC
  Filled 2017-02-28: qty 2

## 2017-02-28 NOTE — Telephone Encounter (Signed)
Scheduled appt per 8/17 los - patient to get new schedule in the treatment area.

## 2017-02-28 NOTE — Patient Instructions (Addendum)
[facility] Discharge Instructions for Patients Receiving Chemotherapy  Today you received the following Immunotherapy  agents Daratumumab  BELOW ARE SYMPTOMS THAT SHOULD BE REPORTED IMMEDIATELY:  *FEVER GREATER THAN 100.5 F  *CHILLS WITH OR WITHOUT FEVER  NAUSEA AND VOMITING THAT IS NOT CONTROLLED WITH YOUR NAUSEA MEDICATION  *UNUSUAL SHORTNESS OF BREATH  *UNUSUAL BRUISING OR BLEEDING  TENDERNESS IN MOUTH AND THROAT WITH OR WITHOUT PRESENCE OF ULCERS  *URINARY PROBLEMS  *BOWEL PROBLEMS  UNUSUAL RASH Items with * indicate a potential emergency and should be followed up as soon as possible.  Feel free to call the clinic you have any questions or concerns. The clinic phone number is [phone].  Please show the [facility] at check-in to the Emergency Department and triage nurse.  Daratumumab injection What is this medicine? DARATUMUMAB (dar a toom ue mab) is a monoclonal antibody. It is used to treat multiple myeloma. This medicine may be used for other purposes; ask your health care provider or pharmacist if you have questions. COMMON BRAND NAME(S): DARZALEX What should I tell my health care provider before I take this medicine? They need to know if you have any of these conditions: -infection (especially a virus infection such as chickenpox, cold sores, or herpes) -lung or breathing disease -pregnant or trying to get pregnant -breast-feeding -an unusual or allergic reaction to daratumumab, other medicines, foods, dyes, or preservatives How should I use this medicine? This medicine is for infusion into a vein. It is given by a health care professional in a hospital or clinic setting. Talk to your pediatrician regarding the use of this medicine in children. Special care may be needed. Overdosage: If you think you have taken too much of this medicine contact a poison control center or emergency room at once. NOTE: This medicine is only for you. Do  not share this medicine with others. What if I miss a dose? Keep appointments for follow-up doses as directed. It is important not to miss your dose. Call your doctor or health care professional if you are unable to keep an appointment. What may interact with this medicine? Interactions have not been studied. Give your health care provider a list of all the medicines, herbs, non-prescription drugs, or dietary supplements you use. Also tell them if you smoke, drink alcohol, or use illegal drugs. Some items may interact with your medicine. This list may not describe all possible interactions. Give your health care provider a list of all the medicines, herbs, non-prescription drugs, or dietary supplements you use. Also tell them if you smoke, drink alcohol, or use illegal drugs. Some items may interact with your medicine. What should I watch for while using this medicine? This drug may make you feel generally unwell. Report any side effects. Continue your course of treatment even though you feel ill unless your doctor tells you to stop. This medicine can cause serious allergic reactions. To reduce your risk you may need to take medicine before treatment with this medicine. Take your medicine as directed. This medicine can affect the results of blood tests to match your blood type. These changes can last for up to 6 months after the final dose. Your healthcare provider will do blood tests to match your blood type before you start treatment. Tell all of your healthcare providers that you are being treated with this medicine before receiving a blood transfusion. This medicine can affect the results of some tests used to determine treatment response; extra tests may  be needed to evaluate response. Do not become pregnant while taking this medicine or for 3 months after stopping it. Women should inform their doctor if they wish to become pregnant or think they might be pregnant. There is a potential for serious  side effects to an unborn child. Talk to your health care professional or pharmacist for more information. What side effects may I notice from receiving this medicine? Side effects that you should report to your doctor or health care professional as soon as possible: -allergic reactions like skin rash, itching or hives, swelling of the face, lips, or tongue -breathing problems -chills -cough -dizziness -feeling faint or lightheaded -headache -low blood counts - this medicine may decrease the number of white blood cells, red blood cells and platelets. You may be at increased risk for infections and bleeding. -nausea, vomiting -shortness of breath -signs of decreased platelets or bleeding - bruising, pinpoint red spots on the skin, black, tarry stools, blood in the urine -signs of decreased red blood cells - unusually weak or tired, feeling faint or lightheaded, falls -signs of infection - fever or chills, cough, sore throat, pain or difficulty passing urine Side effects that usually do not require medical attention (report to your doctor or health care professional if they continue or are bothersome): -back pain -diarrhea -muscle cramps -pain, tingling, numbness in the hands or feet -swelling of the ankles, feet, hands -tiredness This list may not describe all possible side effects. Call your doctor for medical advice about side effects. You may report side effects to FDA at 1-800-FDA-1088. Where should I keep my medicine? Keep out of the reach of children. This drug is given in a hospital or clinic and will not be stored at home. NOTE: This sheet is a summary. It may not cover all possible information. If you have questions about this medicine, talk to your doctor, pharmacist, or health care provider.  2018 [employer] (2015-08-03 10:38:11)

## 2017-03-03 LAB — KAPPA/LAMBDA LIGHT CHAINS
IG KAPPA FREE LIGHT CHAIN: 5.6 mg/L (ref 3.3–19.4)
IG LAMBDA FREE LIGHT CHAIN: 3 mg/L — AB (ref 5.7–26.3)
Kappa/Lambda FluidC Ratio: 1.87 — ABNORMAL HIGH (ref 0.26–1.65)

## 2017-03-04 LAB — PROTEIN ELECTROPHORESIS, SERUM
A/G RATIO SPE: 1.9 — AB (ref 0.7–1.7)
ALBUMIN: 3.8 g/dL (ref 2.9–4.4)
ALPHA 1: 0.3 g/dL (ref 0.0–0.4)
ALPHA 2: 0.6 g/dL (ref 0.4–1.0)
BETA: 0.8 g/dL (ref 0.7–1.3)
Gamma Globulin: 0.3 g/dL — ABNORMAL LOW (ref 0.4–1.8)
Globulin, Total: 2 g/dL — ABNORMAL LOW (ref 2.2–3.9)
M-Spike, %: 0.1 g/dL — ABNORMAL HIGH
Total Protein: 5.8 g/dL — ABNORMAL LOW (ref 6.0–8.5)

## 2017-03-06 LAB — IFE, DARA-SPECIFIC, SERUM
IgA, Qn, Serum: 7 mg/dL — ABNORMAL LOW (ref 64–422)
IgG, Qn, Serum: 362 mg/dL — ABNORMAL LOW (ref 700–1600)
IgM, Qn, Serum: 10 mg/dL — ABNORMAL LOW (ref 26–217)

## 2017-03-10 DIAGNOSIS — H01022 Squamous blepharitis right lower eyelid: Secondary | ICD-10-CM | POA: Diagnosis not present

## 2017-03-10 DIAGNOSIS — H0014 Chalazion left upper eyelid: Secondary | ICD-10-CM | POA: Diagnosis not present

## 2017-03-10 DIAGNOSIS — H4423 Degenerative myopia, bilateral: Secondary | ICD-10-CM | POA: Diagnosis not present

## 2017-03-10 DIAGNOSIS — H01021 Squamous blepharitis right upper eyelid: Secondary | ICD-10-CM | POA: Diagnosis not present

## 2017-03-10 DIAGNOSIS — H01025 Squamous blepharitis left lower eyelid: Secondary | ICD-10-CM | POA: Diagnosis not present

## 2017-03-10 DIAGNOSIS — H01024 Squamous blepharitis left upper eyelid: Secondary | ICD-10-CM | POA: Diagnosis not present

## 2017-03-12 ENCOUNTER — Ambulatory Visit (HOSPITAL_COMMUNITY): Payer: Medicare Other | Attending: Cardiology

## 2017-03-12 ENCOUNTER — Other Ambulatory Visit: Payer: Self-pay

## 2017-03-12 ENCOUNTER — Other Ambulatory Visit: Payer: Self-pay | Admitting: *Deleted

## 2017-03-12 DIAGNOSIS — L821 Other seborrheic keratosis: Secondary | ICD-10-CM | POA: Diagnosis not present

## 2017-03-12 DIAGNOSIS — D044 Carcinoma in situ of skin of scalp and neck: Secondary | ICD-10-CM | POA: Diagnosis not present

## 2017-03-12 DIAGNOSIS — E785 Hyperlipidemia, unspecified: Secondary | ICD-10-CM | POA: Diagnosis not present

## 2017-03-12 DIAGNOSIS — I252 Old myocardial infarction: Secondary | ICD-10-CM

## 2017-03-12 DIAGNOSIS — I071 Rheumatic tricuspid insufficiency: Secondary | ICD-10-CM | POA: Diagnosis not present

## 2017-03-12 DIAGNOSIS — I509 Heart failure, unspecified: Secondary | ICD-10-CM | POA: Diagnosis not present

## 2017-03-12 DIAGNOSIS — D61818 Other pancytopenia: Secondary | ICD-10-CM

## 2017-03-12 DIAGNOSIS — I429 Cardiomyopathy, unspecified: Secondary | ICD-10-CM | POA: Insufficient documentation

## 2017-03-12 DIAGNOSIS — I428 Other cardiomyopathies: Secondary | ICD-10-CM | POA: Insufficient documentation

## 2017-03-12 DIAGNOSIS — D485 Neoplasm of uncertain behavior of skin: Secondary | ICD-10-CM | POA: Diagnosis not present

## 2017-03-12 DIAGNOSIS — L814 Other melanin hyperpigmentation: Secondary | ICD-10-CM | POA: Diagnosis not present

## 2017-03-12 DIAGNOSIS — I2510 Atherosclerotic heart disease of native coronary artery without angina pectoris: Secondary | ICD-10-CM | POA: Insufficient documentation

## 2017-03-12 DIAGNOSIS — L570 Actinic keratosis: Secondary | ICD-10-CM | POA: Diagnosis not present

## 2017-03-12 DIAGNOSIS — C9000 Multiple myeloma not having achieved remission: Secondary | ICD-10-CM

## 2017-03-12 MED ORDER — ACYCLOVIR 400 MG PO TABS: 400.0000 mg | ORAL_TABLET | Freq: Two times a day (BID) | ORAL | 11 refills | Status: DC

## 2017-03-19 DIAGNOSIS — C9002 Multiple myeloma in relapse: Secondary | ICD-10-CM | POA: Diagnosis not present

## 2017-03-19 DIAGNOSIS — Z8579 Personal history of other malignant neoplasms of lymphoid, hematopoietic and related tissues: Secondary | ICD-10-CM | POA: Diagnosis not present

## 2017-03-20 ENCOUNTER — Encounter: Payer: Self-pay | Admitting: Pharmacist

## 2017-03-26 DIAGNOSIS — D044 Carcinoma in situ of skin of scalp and neck: Secondary | ICD-10-CM | POA: Diagnosis not present
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
## 2017-03-28 ENCOUNTER — Ambulatory Visit (HOSPITAL_BASED_OUTPATIENT_CLINIC_OR_DEPARTMENT_OTHER): Payer: Medicare Other

## 2017-03-28 ENCOUNTER — Encounter: Payer: Self-pay | Admitting: Hematology

## 2017-03-28 ENCOUNTER — Other Ambulatory Visit (HOSPITAL_BASED_OUTPATIENT_CLINIC_OR_DEPARTMENT_OTHER): Payer: Medicare Other

## 2017-03-28 ENCOUNTER — Ambulatory Visit (HOSPITAL_BASED_OUTPATIENT_CLINIC_OR_DEPARTMENT_OTHER): Payer: Medicare Other | Admitting: Hematology

## 2017-03-28 ENCOUNTER — Telehealth: Payer: Self-pay

## 2017-03-28 VITALS — BP 115/76 | HR 66 | Temp 98.1°F | Resp 17 | Ht 66.0 in | Wt 123.1 lb

## 2017-03-28 VITALS — BP 110/65 | HR 64 | Temp 97.9°F | Resp 17

## 2017-03-28 DIAGNOSIS — I509 Heart failure, unspecified: Secondary | ICD-10-CM | POA: Diagnosis not present

## 2017-03-28 DIAGNOSIS — Z5112 Encounter for antineoplastic immunotherapy: Secondary | ICD-10-CM | POA: Diagnosis not present

## 2017-03-28 DIAGNOSIS — M818 Other osteoporosis without current pathological fracture: Secondary | ICD-10-CM

## 2017-03-28 DIAGNOSIS — D61818 Other pancytopenia: Secondary | ICD-10-CM

## 2017-03-28 DIAGNOSIS — C9002 Multiple myeloma in relapse: Secondary | ICD-10-CM | POA: Diagnosis not present

## 2017-03-28 DIAGNOSIS — C9000 Multiple myeloma not having achieved remission: Secondary | ICD-10-CM

## 2017-03-28 DIAGNOSIS — D6481 Anemia due to antineoplastic chemotherapy: Secondary | ICD-10-CM | POA: Diagnosis not present

## 2017-03-28 DIAGNOSIS — T451X5A Adverse effect of antineoplastic and immunosuppressive drugs, initial encounter: Secondary | ICD-10-CM

## 2017-03-28 LAB — CBC WITH DIFFERENTIAL/PLATELET
BASO%: 0.2 % (ref 0.0–2.0)
Basophils Absolute: 0 10*3/uL (ref 0.0–0.1)
EOS%: 0.1 % (ref 0.0–7.0)
Eosinophils Absolute: 0 10*3/uL (ref 0.0–0.5)
HCT: 31.8 % — ABNORMAL LOW (ref 34.8–46.6)
HEMOGLOBIN: 11 g/dL — AB (ref 11.6–15.9)
LYMPH%: 6.3 % — AB (ref 14.0–49.7)
MCH: 37.3 pg — ABNORMAL HIGH (ref 25.1–34.0)
MCHC: 34.7 g/dL (ref 31.5–36.0)
MCV: 107.4 fL — ABNORMAL HIGH (ref 79.5–101.0)
MONO#: 0.6 10*3/uL (ref 0.1–0.9)
MONO%: 7.5 % (ref 0.0–14.0)
NEUT#: 6.4 10*3/uL (ref 1.5–6.5)
NEUT%: 85.9 % — AB (ref 38.4–76.8)
PLATELETS: 164 10*3/uL (ref 145–400)
RBC: 2.96 10*6/uL — ABNORMAL LOW (ref 3.70–5.45)
RDW: 14.5 % (ref 11.2–14.5)
WBC: 7.5 10*3/uL (ref 3.9–10.3)
lymph#: 0.5 10*3/uL — ABNORMAL LOW (ref 0.9–3.3)

## 2017-03-28 LAB — COMPREHENSIVE METABOLIC PANEL
ALT: 28 U/L (ref 0–55)
ANION GAP: 10 meq/L (ref 3–11)
AST: 29 U/L (ref 5–34)
Albumin: 4.3 g/dL (ref 3.5–5.0)
Alkaline Phosphatase: 51 U/L (ref 40–150)
BILIRUBIN TOTAL: 0.73 mg/dL (ref 0.20–1.20)
BUN: 26.9 mg/dL — AB (ref 7.0–26.0)
CALCIUM: 9.6 mg/dL (ref 8.4–10.4)
CO2: 22 mEq/L (ref 22–29)
CREATININE: 0.9 mg/dL (ref 0.6–1.1)
Chloride: 104 mEq/L (ref 98–109)
EGFR: 64 mL/min/{1.73_m2} — ABNORMAL LOW (ref >90)
Glucose: 87 mg/dl (ref 70–140)
Potassium: 4.3 mEq/L (ref 3.5–5.1)
Sodium: 136 mEq/L (ref 136–145)
TOTAL PROTEIN: 6.5 g/dL (ref 6.4–8.3)

## 2017-03-28 MED ORDER — ACETAMINOPHEN 325 MG PO TABS
ORAL_TABLET | ORAL | Status: AC
  Filled 2017-03-28: qty 2

## 2017-03-28 MED ORDER — DIPHENHYDRAMINE HCL 25 MG PO CAPS
50.0000 mg | ORAL_CAPSULE | Freq: Once | ORAL | Status: AC
  Administered 2017-03-28: 50 mg via ORAL

## 2017-03-28 MED ORDER — MONTELUKAST SODIUM 10 MG PO TABS
ORAL_TABLET | ORAL | Status: AC
  Filled 2017-03-28: qty 1

## 2017-03-28 MED ORDER — SODIUM CHLORIDE 0.9 % IV SOLN
Freq: Once | INTRAVENOUS | Status: AC
  Administered 2017-03-28: 11:00:00 via INTRAVENOUS

## 2017-03-28 MED ORDER — DIPHENHYDRAMINE HCL 25 MG PO CAPS
ORAL_CAPSULE | ORAL | Status: AC
  Filled 2017-03-28: qty 2

## 2017-03-28 MED ORDER — SODIUM CHLORIDE 0.9 % IV SOLN
16.0000 mg/kg | Freq: Once | INTRAVENOUS | Status: AC
  Administered 2017-03-28: 900 mg via INTRAVENOUS
  Filled 2017-03-28: qty 40

## 2017-03-28 MED ORDER — ACETAMINOPHEN 325 MG PO TABS
650.0000 mg | ORAL_TABLET | Freq: Once | ORAL | Status: AC
  Administered 2017-03-28: 650 mg via ORAL

## 2017-03-28 MED ORDER — MONTELUKAST SODIUM 10 MG PO TABS
10.0000 mg | ORAL_TABLET | Freq: Once | ORAL | Status: AC
  Administered 2017-03-28: 10 mg via ORAL

## 2017-03-28 MED ORDER — METHYLPREDNISOLONE SODIUM SUCC 125 MG IJ SOLR
125.0000 mg | Freq: Once | INTRAMUSCULAR | Status: AC
  Administered 2017-03-28: 125 mg via INTRAVENOUS

## 2017-03-28 MED ORDER — METHYLPREDNISOLONE SODIUM SUCC 125 MG IJ SOLR
INTRAMUSCULAR | Status: AC
  Filled 2017-03-28: qty 2

## 2017-03-28 NOTE — Telephone Encounter (Signed)
Avs and calender to be printed in infusion per 9/14 los

## 2017-03-28 NOTE — Patient Instructions (Signed)
[facility] Discharge Instructions for Patients Receiving Chemotherapy  Today you received the following chemotherapy agents Darzalex.  To help prevent nausea and vomiting after your treatment, we encourage you to take your nausea medication as directed.  If you develop nausea and vomiting that is not controlled by your nausea medication, call the clinic.   BELOW ARE SYMPTOMS THAT SHOULD BE REPORTED IMMEDIATELY:  *FEVER GREATER THAN 100.5 F  *CHILLS WITH OR WITHOUT FEVER  NAUSEA AND VOMITING THAT IS NOT CONTROLLED WITH YOUR NAUSEA MEDICATION  *UNUSUAL SHORTNESS OF BREATH  *UNUSUAL BRUISING OR BLEEDING  TENDERNESS IN MOUTH AND THROAT WITH OR WITHOUT PRESENCE OF ULCERS  *URINARY PROBLEMS  *BOWEL PROBLEMS  UNUSUAL RASH Items with * indicate a potential emergency and should be followed up as soon as possible.  Feel free to call the clinic you have any questions or concerns. The clinic phone number is [phone].  Please show the CHEMO ALERT CARD at check-in to the Emergency Department and triage nurse.    

## 2017-03-31 DIAGNOSIS — Z1231 Encounter for screening mammogram for malignant neoplasm of breast: Secondary | ICD-10-CM | POA: Diagnosis not present

## 2017-03-31 LAB — UPEP/UIFE/LIGHT CHAINS/TP, 24-HR UR
% BETA, Urine: 0 %
ALBUMIN, U: 100 %
ALPHA 1 URINE: 0 %
ALPHA-2-GLOBULIN, U: 0 %
FREE LAMBDA LT CHAINS, UR: 0.18 mg/L — AB (ref 0.24–6.66)
Free Kappa Lt Chains,Ur: 7.98 mg/L (ref 1.35–24.19)
GAMMA GLOBULIN URINE: 0 %
KAPPA/LAMBDA RATIO, U: 44.33 — AB (ref 2.04–10.37)
PROTEIN,TOTAL,URINE: 4.8 mg/dL
Prot,24hr calculated: 96 mg/24 hr (ref 30–150)

## 2017-04-03 LAB — IFE, DARA-SPECIFIC, SERUM
IgA, Qn, Serum: 7 mg/dL — ABNORMAL LOW (ref 64–422)
IgG, Qn, Serum: 436 mg/dL — ABNORMAL LOW (ref 700–1600)
IgM, Qn, Serum: 10 mg/dL — ABNORMAL LOW (ref 26–217)

## 2017-04-04 LAB — PROTEIN ELECTROPHORESIS, SERUM
A/G Ratio: 1.9 — ABNORMAL HIGH (ref 0.7–1.7)
ALBUMIN: 3.9 g/dL (ref 2.9–4.4)
ALPHA 1: 0.3 g/dL (ref 0.0–0.4)
Alpha 2: 0.6 g/dL (ref 0.4–1.0)
BETA: 0.9 g/dL (ref 0.7–1.3)
GAMMA GLOBULIN: 0.4 g/dL (ref 0.4–1.8)
Globulin, Total: 2.1 g/dL — ABNORMAL LOW (ref 2.2–3.9)
M-Spike, %: 0.1 g/dL — ABNORMAL HIGH
Total Protein: 6 g/dL (ref 6.0–8.5)

## 2017-04-11 ENCOUNTER — Ambulatory Visit (HOSPITAL_BASED_OUTPATIENT_CLINIC_OR_DEPARTMENT_OTHER): Payer: Medicare Other

## 2017-04-11 ENCOUNTER — Other Ambulatory Visit (HOSPITAL_BASED_OUTPATIENT_CLINIC_OR_DEPARTMENT_OTHER): Payer: Medicare Other

## 2017-04-11 VITALS — BP 98/67 | HR 60 | Temp 98.1°F | Resp 17

## 2017-04-11 DIAGNOSIS — Z5112 Encounter for antineoplastic immunotherapy: Secondary | ICD-10-CM | POA: Diagnosis not present

## 2017-04-11 DIAGNOSIS — C9000 Multiple myeloma not having achieved remission: Secondary | ICD-10-CM

## 2017-04-11 DIAGNOSIS — I252 Old myocardial infarction: Secondary | ICD-10-CM

## 2017-04-11 DIAGNOSIS — C9002 Multiple myeloma in relapse: Secondary | ICD-10-CM | POA: Diagnosis not present

## 2017-04-11 DIAGNOSIS — M810 Age-related osteoporosis without current pathological fracture: Secondary | ICD-10-CM

## 2017-04-11 DIAGNOSIS — D61818 Other pancytopenia: Secondary | ICD-10-CM

## 2017-04-11 LAB — CBC WITH DIFFERENTIAL/PLATELET
BASO%: 0.8 % (ref 0.0–2.0)
Basophils Absolute: 0 10*3/uL (ref 0.0–0.1)
EOS%: 1.2 % (ref 0.0–7.0)
Eosinophils Absolute: 0 10*3/uL (ref 0.0–0.5)
HEMATOCRIT: 33.1 % — AB (ref 34.8–46.6)
HEMOGLOBIN: 11.5 g/dL — AB (ref 11.6–15.9)
LYMPH#: 0.4 10*3/uL — AB (ref 0.9–3.3)
LYMPH%: 11.9 % — ABNORMAL LOW (ref 14.0–49.7)
MCH: 37.3 pg — ABNORMAL HIGH (ref 25.1–34.0)
MCHC: 34.7 g/dL (ref 31.5–36.0)
MCV: 107.4 fL — ABNORMAL HIGH (ref 79.5–101.0)
MONO#: 0.3 10*3/uL (ref 0.1–0.9)
MONO%: 9 % (ref 0.0–14.0)
NEUT#: 2.7 10*3/uL (ref 1.5–6.5)
NEUT%: 77.1 % — ABNORMAL HIGH (ref 38.4–76.8)
Platelets: 158 10*3/uL (ref 145–400)
RBC: 3.08 10*6/uL — ABNORMAL LOW (ref 3.70–5.45)
RDW: 14.7 % — AB (ref 11.2–14.5)
WBC: 3.5 10*3/uL — ABNORMAL LOW (ref 3.9–10.3)

## 2017-04-11 LAB — COMPREHENSIVE METABOLIC PANEL
ALBUMIN: 4 g/dL (ref 3.5–5.0)
ALT: 12 U/L (ref 0–55)
AST: 18 U/L (ref 5–34)
Alkaline Phosphatase: 49 U/L (ref 40–150)
Anion Gap: 10 mEq/L (ref 3–11)
BUN: 18.4 mg/dL (ref 7.0–26.0)
CALCIUM: 9.6 mg/dL (ref 8.4–10.4)
CHLORIDE: 107 meq/L (ref 98–109)
CO2: 24 mEq/L (ref 22–29)
CREATININE: 0.8 mg/dL (ref 0.6–1.1)
EGFR: 70 mL/min/{1.73_m2} — ABNORMAL LOW (ref >90)
GLUCOSE: 82 mg/dL (ref 70–140)
POTASSIUM: 4.2 meq/L (ref 3.5–5.1)
SODIUM: 141 meq/L (ref 136–145)
Total Bilirubin: 0.81 mg/dL (ref 0.20–1.20)
Total Protein: 6.1 g/dL — ABNORMAL LOW (ref 6.4–8.3)

## 2017-04-11 MED ORDER — SODIUM CHLORIDE 0.9 % IV SOLN
Freq: Once | INTRAVENOUS | Status: AC
  Administered 2017-04-11: 10:00:00 via INTRAVENOUS

## 2017-04-11 MED ORDER — MONTELUKAST SODIUM 10 MG PO TABS
ORAL_TABLET | ORAL | Status: AC
  Filled 2017-04-11: qty 1

## 2017-04-11 MED ORDER — METHYLPREDNISOLONE SODIUM SUCC 125 MG IJ SOLR
125.0000 mg | Freq: Once | INTRAMUSCULAR | Status: AC
  Administered 2017-04-11: 125 mg via INTRAVENOUS

## 2017-04-11 MED ORDER — ACETAMINOPHEN 325 MG PO TABS
ORAL_TABLET | ORAL | Status: AC
  Filled 2017-04-11: qty 2

## 2017-04-11 MED ORDER — MONTELUKAST SODIUM 10 MG PO TABS
10.0000 mg | ORAL_TABLET | Freq: Once | ORAL | Status: AC
  Administered 2017-04-11: 10 mg via ORAL

## 2017-04-11 MED ORDER — ACETAMINOPHEN 325 MG PO TABS
650.0000 mg | ORAL_TABLET | Freq: Once | ORAL | Status: AC
  Administered 2017-04-11: 650 mg via ORAL

## 2017-04-11 MED ORDER — DIPHENHYDRAMINE HCL 25 MG PO CAPS
ORAL_CAPSULE | ORAL | Status: AC
  Filled 2017-04-11: qty 2

## 2017-04-11 MED ORDER — DIPHENHYDRAMINE HCL 25 MG PO CAPS
50.0000 mg | ORAL_CAPSULE | Freq: Once | ORAL | Status: AC
  Administered 2017-04-11: 50 mg via ORAL

## 2017-04-11 MED ORDER — METHYLPREDNISOLONE SODIUM SUCC 125 MG IJ SOLR
INTRAMUSCULAR | Status: AC
  Filled 2017-04-11: qty 2

## 2017-04-11 MED ORDER — SODIUM CHLORIDE 0.9 % IV SOLN
16.0000 mg/kg | Freq: Once | INTRAVENOUS | Status: AC
  Administered 2017-04-11: 900 mg via INTRAVENOUS
  Filled 2017-04-11: qty 40

## 2017-04-11 NOTE — Patient Instructions (Signed)
[facility] Discharge Instructions for Patients Receiving Chemotherapy  Today you received the following chemotherapy agents Darzalex.  To help prevent nausea and vomiting after your treatment, we encourage you to take your nausea medication as directed.  If you develop nausea and vomiting that is not controlled by your nausea medication, call the clinic.   BELOW ARE SYMPTOMS THAT SHOULD BE REPORTED IMMEDIATELY:  *FEVER GREATER THAN 100.5 F  *CHILLS WITH OR WITHOUT FEVER  NAUSEA AND VOMITING THAT IS NOT CONTROLLED WITH YOUR NAUSEA MEDICATION  *UNUSUAL SHORTNESS OF BREATH  *UNUSUAL BRUISING OR BLEEDING  TENDERNESS IN MOUTH AND THROAT WITH OR WITHOUT PRESENCE OF ULCERS  *URINARY PROBLEMS  *BOWEL PROBLEMS  UNUSUAL RASH Items with * indicate a potential emergency and should be followed up as soon as possible.  Feel free to call the clinic you have any questions or concerns. The clinic phone number is [phone].  Please show the CHEMO ALERT CARD at check-in to the Emergency Department and triage nurse.    

## 2017-04-14 LAB — KAPPA/LAMBDA LIGHT CHAINS
IG KAPPA FREE LIGHT CHAIN: 6.1 mg/L (ref 3.3–19.4)
IG LAMBDA FREE LIGHT CHAIN: 2.9 mg/L — AB (ref 5.7–26.3)
KAPPA/LAMBDA FLC RATIO: 2.1 — AB (ref 0.26–1.65)

## 2017-04-21 ENCOUNTER — Other Ambulatory Visit: Payer: Self-pay

## 2017-04-21 MED ORDER — ATORVASTATIN CALCIUM 40 MG PO TABS: 40.0000 mg | ORAL_TABLET | Freq: Every day | ORAL | 1 refills | Status: DC

## 2017-04-22 ENCOUNTER — Telehealth: Payer: Self-pay | Admitting: Hematology

## 2017-04-22 NOTE — Telephone Encounter (Signed)
04/20/17 new prescription request. Prescription scanned in media

## 2017-04-23 ENCOUNTER — Telehealth: Payer: Self-pay | Admitting: *Deleted

## 2017-04-23 NOTE — Telephone Encounter (Signed)
TCT patient to clarify with her if she needs her acyclovir refilled through [facility] or if she is ok getting this through her local [facility] on [address].  A [facility] with 11 refills. No answer but identified voice mail available. Message elft for pt to call back and clarify

## 2017-04-24 ENCOUNTER — Other Ambulatory Visit: Payer: Self-pay | Admitting: *Deleted

## 2017-04-24 DIAGNOSIS — D61818 Other pancytopenia: Secondary | ICD-10-CM

## 2017-04-24 DIAGNOSIS — I252 Old myocardial infarction: Secondary | ICD-10-CM

## 2017-04-24 DIAGNOSIS — C9000 Multiple myeloma not having achieved remission: Secondary | ICD-10-CM

## 2017-04-24 MED ORDER — ACYCLOVIR 400 MG PO TABS: 400.0000 mg | ORAL_TABLET | Freq: Two times a day (BID) | ORAL | 11 refills | Status: DC

## 2017-04-24 NOTE — Telephone Encounter (Signed)
TCT patient and confirmed with her that she wants her acyclovir filled through [facility] as it is much less expensive. She will let us know if, in the future, which pharmcay she wants for any other prescriptions.

## 2017-04-25 ENCOUNTER — Ambulatory Visit (HOSPITAL_BASED_OUTPATIENT_CLINIC_OR_DEPARTMENT_OTHER): Payer: Medicare Other | Admitting: Nurse Practitioner

## 2017-04-25 ENCOUNTER — Other Ambulatory Visit (HOSPITAL_BASED_OUTPATIENT_CLINIC_OR_DEPARTMENT_OTHER): Payer: Medicare Other

## 2017-04-25 ENCOUNTER — Ambulatory Visit (HOSPITAL_BASED_OUTPATIENT_CLINIC_OR_DEPARTMENT_OTHER): Payer: Medicare Other

## 2017-04-25 VITALS — BP 123/78 | HR 64 | Temp 97.5°F | Resp 18 | Ht 66.0 in | Wt 122.7 lb

## 2017-04-25 DIAGNOSIS — C9000 Multiple myeloma not having achieved remission: Secondary | ICD-10-CM

## 2017-04-25 DIAGNOSIS — D61810 Antineoplastic chemotherapy induced pancytopenia: Secondary | ICD-10-CM | POA: Diagnosis not present

## 2017-04-25 DIAGNOSIS — T451X5A Adverse effect of antineoplastic and immunosuppressive drugs, initial encounter: Secondary | ICD-10-CM

## 2017-04-25 DIAGNOSIS — C9002 Multiple myeloma in relapse: Secondary | ICD-10-CM

## 2017-04-25 DIAGNOSIS — Z5112 Encounter for antineoplastic immunotherapy: Secondary | ICD-10-CM

## 2017-04-25 DIAGNOSIS — D6481 Anemia due to antineoplastic chemotherapy: Secondary | ICD-10-CM

## 2017-04-25 LAB — COMPREHENSIVE METABOLIC PANEL
ALT: 16 U/L (ref 0–55)
AST: 19 U/L (ref 5–34)
Albumin: 4.2 g/dL (ref 3.5–5.0)
Alkaline Phosphatase: 49 U/L (ref 40–150)
Anion Gap: 11 mEq/L (ref 3–11)
BILIRUBIN TOTAL: 0.78 mg/dL (ref 0.20–1.20)
BUN: 19.3 mg/dL (ref 7.0–26.0)
CHLORIDE: 106 meq/L (ref 98–109)
CO2: 21 meq/L — AB (ref 22–29)
Calcium: 9.5 mg/dL (ref 8.4–10.4)
Creatinine: 0.9 mg/dL (ref 0.6–1.1)
GLUCOSE: 81 mg/dL (ref 70–140)
Potassium: 4.1 mEq/L (ref 3.5–5.1)
SODIUM: 139 meq/L (ref 136–145)
TOTAL PROTEIN: 6.3 g/dL — AB (ref 6.4–8.3)

## 2017-04-25 LAB — CBC WITH DIFFERENTIAL/PLATELET
BASO%: 0.1 % (ref 0.0–2.0)
Basophils Absolute: 0 10*3/uL (ref 0.0–0.1)
EOS ABS: 0 10*3/uL (ref 0.0–0.5)
EOS%: 0 % (ref 0.0–7.0)
HCT: 31.8 % — ABNORMAL LOW (ref 34.8–46.6)
HGB: 11 g/dL — ABNORMAL LOW (ref 11.6–15.9)
LYMPH%: 6 % — AB (ref 14.0–49.7)
MCH: 36.9 pg — ABNORMAL HIGH (ref 25.1–34.0)
MCHC: 34.7 g/dL (ref 31.5–36.0)
MCV: 106.3 fL — AB (ref 79.5–101.0)
MONO#: 0.6 10*3/uL (ref 0.1–0.9)
MONO%: 7.4 % (ref 0.0–14.0)
NEUT%: 86.5 % — AB (ref 38.4–76.8)
NEUTROS ABS: 6.6 10*3/uL — AB (ref 1.5–6.5)
Platelets: 142 10*3/uL — ABNORMAL LOW (ref 145–400)
RBC: 2.99 10*6/uL — AB (ref 3.70–5.45)
RDW: 14.4 % (ref 11.2–14.5)
WBC: 7.7 10*3/uL (ref 3.9–10.3)
lymph#: 0.5 10*3/uL — ABNORMAL LOW (ref 0.9–3.3)

## 2017-04-25 MED ORDER — HEPARIN SOD (PORK) LOCK FLUSH 100 UNIT/ML IV SOLN
500.0000 [IU] | Freq: Once | INTRAVENOUS | Status: AC | PRN
  Administered 2017-04-25: 500 [IU]
  Filled 2017-04-25: qty 5

## 2017-04-25 MED ORDER — MONTELUKAST SODIUM 10 MG PO TABS
ORAL_TABLET | ORAL | Status: AC
  Filled 2017-04-25: qty 1

## 2017-04-25 MED ORDER — SODIUM CHLORIDE 0.9 % IV SOLN
Freq: Once | INTRAVENOUS | Status: AC
  Administered 2017-04-25: 11:00:00 via INTRAVENOUS

## 2017-04-25 MED ORDER — DIPHENHYDRAMINE HCL 25 MG PO CAPS
ORAL_CAPSULE | ORAL | Status: AC
  Filled 2017-04-25: qty 2

## 2017-04-25 MED ORDER — SODIUM CHLORIDE 0.9 % IV SOLN
16.0000 mg/kg | Freq: Once | INTRAVENOUS | Status: AC
  Administered 2017-04-25: 900 mg via INTRAVENOUS
  Filled 2017-04-25: qty 40

## 2017-04-25 MED ORDER — METHYLPREDNISOLONE SODIUM SUCC 125 MG IJ SOLR
INTRAMUSCULAR | Status: AC
  Filled 2017-04-25: qty 2

## 2017-04-25 MED ORDER — MONTELUKAST SODIUM 10 MG PO TABS
10.0000 mg | ORAL_TABLET | Freq: Once | ORAL | Status: AC
  Administered 2017-04-25: 10 mg via ORAL

## 2017-04-25 MED ORDER — SODIUM CHLORIDE 0.9% FLUSH
10.0000 mL | INTRAVENOUS | Status: DC | PRN
  Administered 2017-04-25: 10 mL
  Filled 2017-04-25: qty 10

## 2017-04-25 MED ORDER — ACETAMINOPHEN 325 MG PO TABS
650.0000 mg | ORAL_TABLET | Freq: Once | ORAL | Status: AC
  Administered 2017-04-25: 650 mg via ORAL

## 2017-04-25 MED ORDER — METHYLPREDNISOLONE SODIUM SUCC 125 MG IJ SOLR
125.0000 mg | Freq: Once | INTRAMUSCULAR | Status: AC
  Administered 2017-04-25: 125 mg via INTRAVENOUS

## 2017-04-25 MED ORDER — ACETAMINOPHEN 325 MG PO TABS
ORAL_TABLET | ORAL | Status: AC
  Filled 2017-04-25: qty 2

## 2017-04-25 MED ORDER — DIPHENHYDRAMINE HCL 25 MG PO CAPS
50.0000 mg | ORAL_CAPSULE | Freq: Once | ORAL | Status: AC
  Administered 2017-04-25: 50 mg via ORAL

## 2017-04-25 NOTE — Progress Notes (Signed)
[facility] OFFICE PROGRESS NOTE    DIAGNOSIS: Multiple myeloma with relapse  CHIEF COMPLAINTS: Follow-up of multiple myeloma      Oncology History   Multiple myeloma ([place])   Staging form: Multiple Myeloma, AJCC 6th Edition   - Clinical: Stage IIIA - Signed by [name], MD on 09/04/2013      Multiple myeloma ([place])   05/26/2012 Initial Diagnosis    Presenting IgG was 4,040 mg/dL on 06/05/2012 (IgA 35; IgM 34); kappa free light chain 34.4 mg/dL, lambda 0.00, kappa:lambda ratio of 34.75; SPEP with M-spike of 2.60.      06/30/2012 Imaging    Numerous lytic lesions throughout the calvarium.  Lytic lesion within the left lateral scapula.  Findings most compatible with metastases or myeloma. Multiple mid and lower thoracic compression fractures.  Slight compression through the endplates at L3.      38/18/2993 Bone Marrow Biopsy    Bone marrow biopsy showed 49% plasma cell. Normal classical cytogenetics; however, myeloma FISH panel showed 13q- (intermediate risk)          07/14/2012 - 07/27/2012 Radiation Therapy    Palliative radiation 20 Gy over 10 fractions between to thoracic spine cord compression and symptomatic left scapula lytic lesion.      08/10/2012 - 11/2012 Chemotherapy    Started SQ Velcade once weekly, 3 weeks on, 1 weeks off; daily Revlimid d1-21, 7 days off; and Dexamethasone 565m PO weekly.       02/12/2013 Bone Marrow Transplant    [facility]       06/14/2013 Tumor Marker    IgG  1830,  Kappa:lamba ratio, 1.69 (baseline was 34.75 or   M-spike 0.28 (baseline of 2.6 or  89.3% of baseline).        06/25/2013 -  Chemotherapy    Started zometa 3.5 mg monthly       09/01/2013 - 01/2014 Chemotherapy    Maintenance therapy with revlimid 514mdaily for 14 days and then off for 7 (decreased from 21 days on and 7 days off based on neutropenia). Stopped due to disease progression  01/2017      09/13/2013 - 09/17/2014 Hospital Admission    Hospital admission for pneumonia      12/20/2013 Treatment Plan Change    Maintenance therapy decreased to 2.5 mg daily for 21 days and then off for 7 days based on low counts.       01/25/2014 Tumor Marker    IgG 1360 M spike 0.5      03/01/2014 - 03/05/2014 Hospital Ad[facility], received thrombolytic therapy, cath showed 60% prox RCA, mid-distal RCA 50%, Echo normal, no stents.      04/11/2014 - 08/07/2014 Chemotherapy    Continue Revlimid at 2.5 mg 3 weeks on 1 week off      08/08/2014 - 08/13/2014 Hospital Admission    Hospital admission for pneumonia. Her Revlimid was held.      08/29/2014 - 09/14/2014 Chemotherapy    Maintenance Revlimid restarted      09/14/2014 - 09/17/2014 Hospital Admission    [place]      11/13/2014 - 12/25/2015 Chemotherapy    Maintenance Revlimid restarted, changed to 2.65m23maily, 2 weks on, 1 week off from 12/15/2014, stopped due to disease progression       01/24/2016 Progression  Patient is M protein has gradually increased to 1.0g, repeated a bone marrow biopsy showed plasma cell 10-30%      02/27/2016 - 03/07/2016 Chemotherapy    Pomalidomide 4 mg daily on day 1-21, dexamethasone 40 mg on day 1, 8 and 15, every 28 days, started on 02/27/2016, daratumumab weekly started on 03/07/2016, held after first dose, due to hospitalization and a severe pancytopenia, pomalidomide was stopped afterwards       03/10/2016 - 03/29/2016 Hospital Admission    Pt was admitted for sepsis from pneumonia, and severe pancytopenia. She required ICU stay for a few days due to hypotension, developed b/l pleural effusion required diuretics and b/l thoracentesis. She also required blood and plt transfusion, and prolonged  neupogen injection for severe neutropenia. She was discharged home after 3 weeks hospital stay       04/26/2016 -  Chemotherapy    Daratumumab restarted on 04/26/2016, Velcade 1.71m/m2 and dexa 225mweekly start on 11/3, velcade held since 06/28/2016 due to CHF       08/26/2016 Echocardiogram    - Left ventricle: LVEF is approximately 35% with diffuse diffuse hypokinesis with severe hypokinesis/ akinesis of the inferior/inferoseptal walls. In comparison to echo images from December 2017, there does not appear to be significant change The cavity size was normal. Wall thickness was normal. Doppler parameters are consistent with abnormal left ventricular relaxation (grade 1 diastolic dysfunction). - Aortic valve: AV is thickened. 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 Present in echo from December 2017 There was trivial regurgitation. - Right ventricle: Systolic function was mildly reduced. - Right atrium: The atrium was mildly dilated. - Pericardium, extracardiac: A trivial pericardial effusion was identified.      12/03/2016 Echocardiogram    -Left Ventricle: Systolic function was   mildly to moderately reduced. The estimated ejection fraction was   in the range of 40% to 45%. Diffuse hypokinesis. Doppler   parameters are consistent with abnormal left ventricular   relaxation (grade 1 diastolic dysfunction). Doppler parameters   are consistent with indeterminate ventricular filling pressure. - Left atrium: The atrium was severely dilated. - Tricuspid valve: There was trivial regurgitation. - Pulmonary arteries: Systolic pressure was within the normal   range. PA peak pressure: 33 mm Hg (S). - Global longitudinal strain -10.5% (abnormal)      03/12/2017 Echocardiogram    ECHO 03/12/17 Study Conclusions - Left ventricle: The cavity size was normal. Wall thickness was increased  in a pattern of mild LVH. Systolic function was normal. The estimated ejection fraction was in the range of 50% to 55%. There is hypokinesis of the basalinferior myocardium. Doppler parameters are consistent with abnormal left ventricular relaxation (grade 1 diastolic dysfunction). - Aortic valve: There was trivial regurgitation. - Mitral valve: Calcified annulus. Mildly thickened leaflets . There was trivial regurgitation. - Tricuspid valve: There was mild regurgitation. Impressions: - There has been mild improvement in EF since prior study.         CURRENT TREATMENT:   1. Pomalidomide 4 mg daily on day 1-21, dexamethasone 40 mg on day 1, 8 and 15, every 28 days, started on 02/27/2016, daratumumab weekly started on 03/07/2016, treatment on hold since 03/09/2016 due to hospitalization and severe cytopenia 2. Daratumumab restarted on 04/26/2016, Velcade 1.94m65m2 and dexa 25m27mekly start on 11/3, velcade held since 06/28/2016 due to CHF. DaraTonette Rush changed from every 4 weeks to every 2 weeks on 03/28/2017 per Dr. GaspKendell Baneommendation  INTERVAL HISTORY:   Shannon Rush returns as scheduled. She completed a cycle of Daratumumab 04/11/2017. She feels she is tolerating treatment well. She denies any signs of allergic reaction. No rash. No mouth sores. No nausea or vomiting. She continues to have good appetite and good energy level. No areas of new or worsening pain.  Objective:  Vital signs in last 24 hours:  Blood pressure 123/78, pulse 64, temperature (!) 97.5 F (36.4 C), temperature source Oral, resp. rate 18, height 5' 6"  (1.676 m), weight 122 lb 11.2 oz (55.7 kg), SpO2 100 %.    HEENT: No thrush or ulcers. Resp: Lungs clear bilaterally. Cardio: Regular rate and rhythm. GI: Abdomen soft and nontender. No hepatomegaly. Vascular: No leg edema. Neuro: Alert and oriented.  Skin: Nail on the left index finger has a linear groove.    Lab Results:  Lab Results   Component Value Date   WBC 7.7 04/25/2017   HGB 11.0 (L) 04/25/2017   HCT 31.8 (L) 04/25/2017   MCV 106.3 (H) 04/25/2017   PLT 142 (L) 04/25/2017   NEUTROABS 6.6 (H) 04/25/2017    Imaging:  No results found.  Medications: I have reviewed the patient's current medications.  Assessment/Plan: 1. IgG kappa Multiple myeloma s/p Auto SCT, intermediate risk, relapsed in 01/2016 currently being treated with Daratumumab/dexamethasone every 2 weeks. 2. Pancytopenia secondary to chemotherapy. 3. CHF, CAD, inferior STEMI August 2015 4. Osteoporosis. Previously received Zometa. Zometa discontinued as risk felt to outweigh the benefit. 5. Peripheral neuropathy secondary to previous chemotherapy.   Disposition: Shannon Rush appears stable. Plan to continue every 2 week Daratumumab/dexamethasone. She will return for a follow-up visit in 4 weeks. She will contact the office in the interim with any problems.    Ned Card ANP/GNP-BC   04/25/2017  10:11 AM

## 2017-04-25 NOTE — Patient Instructions (Signed)
[facility] Discharge Instructions for Patients Receiving Chemotherapy  Today you received the following chemotherapy agents Darzalex.  To help prevent nausea and vomiting after your treatment, we encourage you to take your nausea medication as directed.  If you develop nausea and vomiting that is not controlled by your nausea medication, call the clinic.   BELOW ARE SYMPTOMS THAT SHOULD BE REPORTED IMMEDIATELY:  *FEVER GREATER THAN 100.5 F  *CHILLS WITH OR WITHOUT FEVER  NAUSEA AND VOMITING THAT IS NOT CONTROLLED WITH YOUR NAUSEA MEDICATION  *UNUSUAL SHORTNESS OF BREATH  *UNUSUAL BRUISING OR BLEEDING  TENDERNESS IN MOUTH AND THROAT WITH OR WITHOUT PRESENCE OF ULCERS  *URINARY PROBLEMS  *BOWEL PROBLEMS  UNUSUAL RASH Items with * indicate a potential emergency and should be followed up as soon as possible.  Feel free to call the clinic you have any questions or concerns. The clinic phone number is [phone].  Please show the CHEMO ALERT CARD at check-in to the Emergency Department and triage nurse.    

## 2017-04-28 ENCOUNTER — Encounter: Payer: Self-pay | Admitting: Family Medicine

## 2017-04-28 MED ORDER — ATORVASTATIN CALCIUM 40 MG PO TABS: 40.0000 mg | ORAL_TABLET | Freq: Every day | ORAL | 1 refills | Status: DC

## 2017-04-28 NOTE — Addendum Note (Signed)
Addended by: Juventino Slovak on: 04/28/2017 03:19 PM   Modules accepted: Orders

## 2017-04-29 ENCOUNTER — Telehealth: Payer: Self-pay | Admitting: Nurse Practitioner

## 2017-04-29 LAB — PROTEIN ELECTROPHORESIS, SERUM
A/G RATIO SPE: 1.4 (ref 0.7–1.7)
ALPHA 1: 0.3 g/dL (ref 0.0–0.4)
Albumin: 3.6 g/dL (ref 2.9–4.4)
Alpha 2: 0.7 g/dL (ref 0.4–1.0)
Beta: 1 g/dL (ref 0.7–1.3)
GAMMA GLOBULIN: 0.4 g/dL (ref 0.4–1.8)
Globulin, Total: 2.5 g/dL (ref 2.2–3.9)
M-Spike, %: 0.1 g/dL — ABNORMAL HIGH
TOTAL PROTEIN: 6.1 g/dL (ref 6.0–8.5)

## 2017-04-29 NOTE — Telephone Encounter (Signed)
Scheduled appt per 10/12 los - Unable to reschedule 10/26 to 10/25 due to capped day - message sent to [facility] . All other appts scheduled - left message with appt date and time.

## 2017-04-30 ENCOUNTER — Encounter: Payer: Self-pay | Admitting: *Deleted

## 2017-04-30 LAB — IFE, DARA-SPECIFIC, SERUM
IGA/IMMUNOGLOBULIN A, SERUM: 6 mg/dL — AB (ref 64–422)
IGM (IMMUNOGLOBIN M), SRM: 14 mg/dL — AB (ref 26–217)
IgG, Qn, Serum: 431 mg/dL — ABNORMAL LOW (ref 700–1600)

## 2017-05-09 ENCOUNTER — Ambulatory Visit (HOSPITAL_BASED_OUTPATIENT_CLINIC_OR_DEPARTMENT_OTHER): Payer: Medicare Other

## 2017-05-09 ENCOUNTER — Other Ambulatory Visit (HOSPITAL_BASED_OUTPATIENT_CLINIC_OR_DEPARTMENT_OTHER): Payer: Medicare Other

## 2017-05-09 VITALS — BP 113/81 | HR 66 | Temp 98.0°F | Resp 17

## 2017-05-09 DIAGNOSIS — I252 Old myocardial infarction: Secondary | ICD-10-CM

## 2017-05-09 DIAGNOSIS — D61818 Other pancytopenia: Secondary | ICD-10-CM | POA: Diagnosis not present

## 2017-05-09 DIAGNOSIS — C9002 Multiple myeloma in relapse: Secondary | ICD-10-CM

## 2017-05-09 DIAGNOSIS — Z5112 Encounter for antineoplastic immunotherapy: Secondary | ICD-10-CM

## 2017-05-09 DIAGNOSIS — C9000 Multiple myeloma not having achieved remission: Secondary | ICD-10-CM

## 2017-05-09 DIAGNOSIS — M810 Age-related osteoporosis without current pathological fracture: Secondary | ICD-10-CM | POA: Diagnosis not present

## 2017-05-09 LAB — CBC WITH DIFFERENTIAL/PLATELET
BASO%: 0.1 % (ref 0.0–2.0)
Basophils Absolute: 0 10*3/uL (ref 0.0–0.1)
EOS ABS: 0 10*3/uL (ref 0.0–0.5)
EOS%: 0 % (ref 0.0–7.0)
HCT: 31.8 % — ABNORMAL LOW (ref 34.8–46.6)
HGB: 11 g/dL — ABNORMAL LOW (ref 11.6–15.9)
LYMPH%: 6.9 % — AB (ref 14.0–49.7)
MCH: 36.9 pg — ABNORMAL HIGH (ref 25.1–34.0)
MCHC: 34.6 g/dL (ref 31.5–36.0)
MCV: 106.7 fL — AB (ref 79.5–101.0)
MONO#: 0.6 10*3/uL (ref 0.1–0.9)
MONO%: 8.1 % (ref 0.0–14.0)
NEUT#: 5.8 10*3/uL (ref 1.5–6.5)
NEUT%: 84.9 % — AB (ref 38.4–76.8)
PLATELETS: 159 10*3/uL (ref 145–400)
RBC: 2.98 10*6/uL — AB (ref 3.70–5.45)
RDW: 14.5 % (ref 11.2–14.5)
WBC: 6.9 10*3/uL (ref 3.9–10.3)
lymph#: 0.5 10*3/uL — ABNORMAL LOW (ref 0.9–3.3)

## 2017-05-09 LAB — COMPREHENSIVE METABOLIC PANEL
ALT: 18 U/L (ref 0–55)
ANION GAP: 11 meq/L (ref 3–11)
AST: 20 U/L (ref 5–34)
Albumin: 4.1 g/dL (ref 3.5–5.0)
Alkaline Phosphatase: 44 U/L (ref 40–150)
BUN: 19.9 mg/dL (ref 7.0–26.0)
CHLORIDE: 106 meq/L (ref 98–109)
CO2: 22 meq/L (ref 22–29)
Calcium: 9.5 mg/dL (ref 8.4–10.4)
Creatinine: 0.8 mg/dL (ref 0.6–1.1)
Glucose: 99 mg/dl (ref 70–140)
POTASSIUM: 4 meq/L (ref 3.5–5.1)
Sodium: 140 mEq/L (ref 136–145)
Total Bilirubin: 0.81 mg/dL (ref 0.20–1.20)
Total Protein: 6.3 g/dL — ABNORMAL LOW (ref 6.4–8.3)

## 2017-05-09 MED ORDER — DIPHENHYDRAMINE HCL 25 MG PO CAPS
50.0000 mg | ORAL_CAPSULE | Freq: Once | ORAL | Status: AC
  Administered 2017-05-09: 50 mg via ORAL

## 2017-05-09 MED ORDER — ACETAMINOPHEN 325 MG PO TABS
650.0000 mg | ORAL_TABLET | Freq: Once | ORAL | Status: AC
  Administered 2017-05-09: 650 mg via ORAL

## 2017-05-09 MED ORDER — ACETAMINOPHEN 325 MG PO TABS
ORAL_TABLET | ORAL | Status: AC
  Filled 2017-05-09: qty 2

## 2017-05-09 MED ORDER — METHYLPREDNISOLONE SODIUM SUCC 125 MG IJ SOLR
INTRAMUSCULAR | Status: AC
  Filled 2017-05-09: qty 2

## 2017-05-09 MED ORDER — SODIUM CHLORIDE 0.9 % IV SOLN
16.0000 mg/kg | Freq: Once | INTRAVENOUS | Status: AC
  Administered 2017-05-09: 900 mg via INTRAVENOUS
  Filled 2017-05-09: qty 40

## 2017-05-09 MED ORDER — MONTELUKAST SODIUM 10 MG PO TABS
10.0000 mg | ORAL_TABLET | Freq: Once | ORAL | Status: AC
  Administered 2017-05-09: 10 mg via ORAL

## 2017-05-09 MED ORDER — SODIUM CHLORIDE 0.9 % IV SOLN
Freq: Once | INTRAVENOUS | Status: AC
  Administered 2017-05-09: 10:00:00 via INTRAVENOUS

## 2017-05-09 MED ORDER — MONTELUKAST SODIUM 10 MG PO TABS
ORAL_TABLET | ORAL | Status: AC
  Filled 2017-05-09: qty 1

## 2017-05-09 MED ORDER — METHYLPREDNISOLONE SODIUM SUCC 125 MG IJ SOLR
125.0000 mg | Freq: Once | INTRAMUSCULAR | Status: AC
  Administered 2017-05-09: 125 mg via INTRAVENOUS

## 2017-05-09 MED ORDER — DIPHENHYDRAMINE HCL 25 MG PO CAPS
ORAL_CAPSULE | ORAL | Status: AC
  Filled 2017-05-09: qty 2

## 2017-05-09 NOTE — Patient Instructions (Signed)
[facility] Discharge Instructions for Patients Receiving Chemotherapy  Today you received the following chemotherapy agents Darzalex.  To help prevent nausea and vomiting after your treatment, we encourage you to take your nausea medication as directed.  If you develop nausea and vomiting that is not controlled by your nausea medication, call the clinic.   BELOW ARE SYMPTOMS THAT SHOULD BE REPORTED IMMEDIATELY:  *FEVER GREATER THAN 100.5 F  *CHILLS WITH OR WITHOUT FEVER  NAUSEA AND VOMITING THAT IS NOT CONTROLLED WITH YOUR NAUSEA MEDICATION  *UNUSUAL SHORTNESS OF BREATH  *UNUSUAL BRUISING OR BLEEDING  TENDERNESS IN MOUTH AND THROAT WITH OR WITHOUT PRESENCE OF ULCERS  *URINARY PROBLEMS  *BOWEL PROBLEMS  UNUSUAL RASH Items with * indicate a potential emergency and should be followed up as soon as possible.  Feel free to call the clinic you have any questions or concerns. The clinic phone number is [phone].  Please show the CHEMO ALERT CARD at check-in to the Emergency Department and triage nurse.    

## 2017-05-12 LAB — KAPPA/LAMBDA LIGHT CHAINS
IG KAPPA FREE LIGHT CHAIN: 5.7 mg/L (ref 3.3–19.4)
Ig Lambda Free Light Chain: 3.3 mg/L — ABNORMAL LOW (ref 5.7–26.3)
Kappa/Lambda FluidC Ratio: 1.73 — ABNORMAL HIGH (ref 0.26–1.65)

## 2017-05-22 NOTE — Progress Notes (Signed)
[name] ONCOLOGY OFFICE PROGRESS NOTE  PCP: [name], MD [place] [address]  DIAGNOSIS: Multiple myeloma with relapse  CHIEF COMPLAINTS: Follow-up of multiple myeloma  Oncology History   Multiple myeloma ([name])   Staging form: Multiple Myeloma, AJCC 6th Edition   - Clinical: Stage IIIA - Signed by [name], MD on 09/04/2013      Multiple myeloma ([name])   05/26/2012 Initial Diagnosis    Presenting IgG was 4,040 mg/dL on 06/05/2012 (IgA 35; IgM 34); kappa free light chain 34.4 mg/dL, lambda 0.00, kappa:lambda ratio of 34.75; SPEP with M-spike of 2.60.      06/30/2012 Imaging    Numerous lytic lesions throughout the calvarium.  Lytic lesion within the left lateral scapula.  Findings most compatible with metastases or myeloma. Multiple mid and lower thoracic compression fractures.  Slight compression through the endplates at L3.      85/88/5027 Bone Marrow Biopsy    Bone marrow biopsy showed 49% plasma cell. Normal classical cytogenetics; however, myeloma FISH panel showed 13q- (intermediate risk)          07/14/2012 - 07/27/2012 Radiation Therapy    Palliative radiation 20 Gy over 10 fractions between to thoracic spine cord compression and symptomatic left scapula lytic lesion.      08/10/2012 - 11/2012 Chemotherapy    Started SQ Velcade once weekly, 3 weeks on, 1 weeks off; daily Revlimid d1-21, 7 days off; and Dexamethasone 76m PO weekly.       02/12/2013 Bone Marrow Transplant    [facility]       06/14/2013 Tumor Marker    IgG  1830,  Kappa:lamba ratio, 1.69 (baseline was 34.75 or   M-spike 0.28 (baseline of 2.6 or  89.3% of baseline).        06/25/2013 -  Chemotherapy    Started zometa 3.5 mg monthly       09/01/2013 - 01/2014 Chemotherapy    Maintenance therapy with revlimid 581mdaily for 14 days and then off for 7 (decreased from 21 days on and 7 days off based on neutropenia). Stopped due  to disease progression 01/2017      09/13/2013 - 09/17/2014 Hospital Admission    Hospital admission for pneumonia      12/20/2013 Treatment Plan Change    Maintenance therapy decreased to 2.5 mg daily for 21 days and then off for 7 days based on low counts.       01/25/2014 Tumor Marker    IgG 1360 M spike 0.5      03/01/2014 - 03/05/2014 Hospital [facility] received thromb[facility] therapy, cath showed 60% prox RCA, mid-distal RCA 50%, Echo normal, no stents.      04/11/2014 - 08/07/2014 Chemotherapy    Continue Revlimid at 2.5 mg 3 weeks on 1 week off      08/08/2014 - 08/13/2014 Hospital Admission    Hospital admission for pneumonia. Her Revlimid was held.      08/29/2014 - 09/14/2014 Chemotherapy    Maintenance Revlimid restarted      09/14/2014 - 09/17/2014 Hospital Admission    [place]      11/13/2014 - 12/25/2015 Chemotherapy    Maintenance Revlimid restarted, changed to 2.64m77maily, 2 weks on, 1 week off from 12/15/2014, stopped due to disease progression  01/24/2016 Progression    Patient is M protein has gradually increased to 1.0g, repeated a bone marrow biopsy showed plasma cell 10-30%      02/27/2016 - 03/07/2016 Chemotherapy    Pomalidomide 4 mg daily on day 1-21, dexamethasone 40 mg on day 1, 8 and 15, every 28 days, started on 02/27/2016, daratumumab weekly started on 03/07/2016, held after first dose, due to hospitalization and a severe pancytopenia, pomalidomide was stopped afterwards       03/10/2016 - 03/29/2016 Hospital Admission    Pt was admitted for sepsis from pneumonia, and severe pancytopenia. She required ICU stay for a few days due to hypotension, developed b/l pleural effusion required diuretics and b/l thoracentesis. She also required blood and plt transfusion, and prolonged neupogen injection for severe  neutropenia. She was discharged home after 3 weeks hospital stay       04/26/2016 -  Chemotherapy    Daratumumab restarted on 04/26/2016, Velcade 1.44m/m2 and dexa 211mweekly start on 11/3, velcade held since 06/28/2016 due to CHF       08/26/2016 Echocardiogram    - Left ventricle: LVEF is approximately 35% with diffuse diffuse   hypokinesis with severe hypokinesis/ akinesis of the   inferior/inferoseptal walls. In comparison to echo images from   December 2017, there does not appear to be significant change The   cavity size was normal. Wall thickness was normal. Doppler   parameters are consistent with abnormal left ventricular   relaxation (grade 1 diastolic dysfunction). - Aortic valve: AV is thickened. 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 Present in echo from December 2017 There was   trivial regurgitation. - Right ventricle: Systolic function was mildly reduced. - Right atrium: The atrium was mildly dilated. - Pericardium, extracardiac: A trivial pericardial effusion was   identified.      12/03/2016 Echocardiogram    -Left Ventricle: Systolic function was   mildly to moderately reduced. The estimated ejection fraction was   in the range of 40% to 45%. Diffuse hypokinesis. Doppler   parameters are consistent with abnormal left ventricular   relaxation (grade 1 diastolic dysfunction). Doppler parameters   are consistent with indeterminate ventricular filling pressure. - Left atrium: The atrium was severely dilated. - Tricuspid valve: There was trivial regurgitation. - Pulmonary arteries: Systolic pressure was within the normal   range. PA peak pressure: 33 mm Hg (S). - Global longitudinal strain -10.5% (abnormal)      03/12/2017 Echocardiogram    ECHO 03/12/17 Study Conclusions - Left ventricle: The cavity size was normal. Wall thickness was   increased in a pattern of mild LVH. Systolic function was  normal.   The estimated ejection fraction was in the range of 50% to 55%.   There is hypokinesis of the basalinferior myocardium. Doppler   parameters are consistent with abnormal left ventricular   relaxation (grade 1 diastolic dysfunction). - Aortic valve: There was trivial regurgitation. - Mitral valve: Calcified annulus. Mildly thickened leaflets .   There was trivial regurgitation. - Tricuspid valve: There was mild regurgitation. Impressions: - There has been mild improvement in EF since prior study.         CURRENT TREATMENT:   1. Pomalidomide 4 mg daily on day 1-21, dexamethasone 40 mg on day 1, 8 and 15, every 28 days, started on 02/27/2016, daratumumab weekly started on 03/07/2016, treatment on hold since 03/09/2016 due to hospitalization and severe cytopenia 2. Daratumumab restarted on  04/26/2016, Velcade 1.66m/m2 and dexa 264mweekly start on 11/3, velcade held since 06/28/2016 due to CHF. DaTonette Biharias changed from every 4 weeks to every 2 weeks on 03/28/2017 per Dr. GaKendell Baneecommendation    INTERVAL HISTORY:  ClANASTASIA TOMPSON89.o. female with a past medical history of MM as detailed above, s/p SCT is here for follow-up.  She presents to the clinic today noting she is doing fine. She is tolerating Dara every 2 weeks. She takes her Dexamethasone in the morning instead of at night so she can sleep at night..  She shows her left index finger nail is very thin. It does not hurt and is not infected currently. She is willing to take biotin to help the growth. She denies pain, fever or cough except in the morning. She had her flu shot already. She denies stomach issues and has regular BM.  [place].      MEDICAL HISTORY: Past Medical History:  Diagnosis Date  . CAD (coronary artery disease)    [place]  pRCA 60%, mid to dist RCA 50% >>>  med rx  . [place]   MRI- diffuse myeloma involvement of T-L spine  . History of radiation therapy 07/13/12-07/27/12   spinal cord compression T3-T10,left scapula  . Hx of echocardiogram    [facility]  Normal LVEF without significant regional wall motion abnormalities. Mild   . Multiple myeloma ([place]/18/2013  . OSTEOPOROSIS 06/11/2010   Multiple compression fractures; and spontaneous fracture of sternum Qualifier: Diagnosis of  By: SaZebedee IbaP, SuManuela Schwartz   . Thoracic kyphosis 07/13/12   per MRI scan  . Unspecified deficiency anemia    ALLERGIES:  is allergic to zosyn [piperacillin sod-tazobactam so]; levaquin [levofloxacin in d5w]; other; piperacillin-tazobactam in dex; zithromax [azithromycin]; and ciprofloxacin.  SURGICAL HISTORY:  Past Surgical History:  Procedure Laterality Date  . APPENDECTOMY    . CESAREAN SECTION     x2   . COLONOSCOPY  2007   neg with Dr. MaWatt Climes. ELBOW SURGERY    . HIP SURGERY  2009   left  . TUBAL LIGATION     REVIEW OF SYSTEMS:  Constitutional: Denies abnormal weight loss (+) slightly lower energy Eyes: Denies blurriness of vision Ears, nose, mouth, throat, and face: Denies mucositis or sore throat,  Respiratory: Denies dyspnea or wheezes Cardiovascular: Denies palpitation, chest discomfort (+)right leg swelling, mild Gastrointestinal:  Denies nausea, heartburn or change in bowel habits Skin: Denies abnormal skin rashes (+) thin nail on left index ifnger Lymphatics: Denies new lymphadenopathy or easy bruising Neurological:Denies new numbness, tingling or new weaknesses. Positive for paraesthesias and numbness to the feet (baseline).  Behavioral/Psych: Mood is stable, no new changes  All other systems were reviewed with the patient and are negative.   PHYSICAL EXAMINATION:  ECOG PERFORMANCE STATUS: 1 BP 138/75 (BP Location: Left Arm, Patient Position: Sitting)   Pulse 76    Temp 97.9 F (36.6 C) (Oral)   Resp 18   Ht _0  (1.727 m)   Wt 125 lb 8 oz (56.9 kg)   SpO2 100%   BMI 19.08 kg/m    GENERAL:alert, no distress and comfortable; easily mobile to exam table; kyphosis, moderate.  SKIN: skin color, texture, turgor are normal, no rashes or significant  lesions except a small healing, dry wound in the right lower leg above his ankle, with surrounding skin erythema and swelling EYES: normal, Conjunctiva are pink, 1 tear/injectionof blood vessel left eye in sclera. OROPHARYNX:no exudate, no erythema and lips, buccal mucosa, and tongue normal, no ONJ NECK: supple, thyroid normal size, non-tender, without nodularity; nodules cervical bilaterally.  LYMPH:  no palpable lymphadenopathy in the cervical, axillary or supraclavicular LUNGS: crackles at the bases bilaterally with normal breathing effort, no wheezes or rhonchi HEART: regular rate & rhythm and no murmurs  ABDOMEN:abdomen soft, and normal bowel sounds Musculoskeletal:no cyanosis of digits and no clubbing, NEURO: alert & oriented x 3 with fluent speech, no focal motor/sensory deficits   Labs:  CBC Latest Ref Rng & Units 05/23/2017 05/09/2017 04/25/2017  WBC 3.9 - 10.3 10e3/uL 5.5 6.9 7.7  Hemoglobin 11.6 - 15.9 g/dL 10.5(L) 11.0(L) 11.0(L)  Hematocrit 34.8 - 46.6 % 32.1(L) 31.8(L) 31.8(L)  Platelets 145 - 400 10e3/uL 138(L) 159 142(L)    CMP Latest Ref Rng & Units 05/23/2017 05/09/2017 04/25/2017  Glucose 70 - 140 mg/dl 141(H) 99 81  BUN 7.0 - 26.0 mg/dL 20.3 19.9 19.3  Creatinine 0.6 - 1.1 mg/dL 0.8 0.8 0.9  Sodium 136 - 145 mEq/L 136 140 139  Potassium 3.5 - 5.1 mEq/L 4.2 4.0 4.1  Chloride 101 - 111 mmol/L - - -  CO2 22 - 29 mEq/L 21(L) 22 21(L)  Calcium 8.4 - 10.4 mg/dL 9.5 9.5 9.5  Total Protein 6.4 - 8.3 g/dL 6.1(L) 6.3(L) 6.1  Total Bilirubin 0.20 - 1.20 mg/dL 0.88 0.81 0.78  Alkaline Phos 40 - 150 U/L 47 44 49  AST 5 - 34 U/L _0 ALT 0 - 55 U/L _1 SPEP  M-PROTEIN 10/01/2013: 0.18 (nadir)  10/13/2014: 0.6 11/21/2014: 0.5 01/30/2015: 0.6 03/13/2015: 0.6 04/20/2015: 0.5 06/01/2015: 0.5 07/20/2015: 0.7 09/14/2015: 0.7 10/26/2015: 0.8 12/07/2015: 1.0 02/07/2016: 1.1  04/03/2016: 0.2 05/03/2016: 0.2  05/31/2016: 0.1 06/28/2016: 0.1 07/25/2016: 0.1 08/21/2016: 0.1 09/27/2016: 0.1 10/23/16: 0.1 12/27/2016: 0.1 (Dara Specific IFE was still positive) 01/24/17: 0.1 02/28/17: 0.1 03/28/17: 0.1 04/25/17: 0.1 05/23/17: PENDING    IgG ([phone]) 10/13/2014: 1710 11/21/2014: 1460 01/30/2015: 1380 03/13/15: 1570 04/20/2015: 1550 06/01/2015: 1590 07/22/2015: 1770 09/14/2015: 7579 10/26/2015: 1898 12/07/2015: 2045 02/07/2016: 1883 04/03/2016: 787 05/03/2016: 685  05/31/2016: 544 06/28/2016: 463 07/25/2016: 455 08/21/2016: 426 09/27/2016: 438 10/23/16: 374 12/06/16: 370 12/27/16: 343, 346 01/24/17: 332  02/28/17: 362 03/28/17:  436 04/25/17: 431 05/23/17: PENDING   Serum kappa, lambda light chains, and ratio  11/21/2014: 3.95, 2.08, 1.90  01/30/2015: 3.92, 2.64, 1.48 03/13/2015: 3.55, 2.17, 1.64 04/20/15: 4.52, 1.78, 2.54 06/01/2015: 4.82, 1.75, 2.75 07/20/2015: 8.71, 2.43, 3.58 09/14/2015: 9.17, 2.02, 4.55 10/26/2015: 11.4, 1.75, 6.42 12/07/2015: 1.54, 1.83, 8.44 02/07/2016: 1.93, 1.42, 13.6 04/03/2016: 0.96, 0.95, 1.01  05/03/2016: 0.71, 0.34, 2.09  05/31/2016: 0.48, 0.26, 1.85 06/28/2016: 0.52, 0.60, 0.87 07/25/2016: 0.46, 0.27, 1.70 08/21/2016: 0.43, 0.27, 1.59 09/27/2016: 0.52, 0.24, 2.17 10/23/16: .0.49, 0.35, 1.40 12/06/16: 0.68, 0.29, 2.34 12/27/16: 0.54, 0.29, 1.86 02/28/17: 0.56, 0.30, 1.87 04/11/17: 0.61, 0.29, 2.10 05/09/17: 0.57, 0.33, 1.73 05/23/17: PENDING    24 hr urine PEP (total protein, M-spike) 10/13/2014: (-) 02/12/2016: 265m, 663m9/22/2017: 114 mg, (-) 08/08/16: 99, (-) 12/27/16: 5913m(-) 03/28/17: 31m36m-) 05/23/17: PENDING   PATHOLOGY REPORT  Diagnosis 01/24/2016 Bone Marrow, Aspirate,Biopsy, and Clot, RT iliac and core - VARIABLY CELLULAR  BONE MARROW WITH PLASMA CELL NEOPLASM. - SEE COMMENT. PERIPHERAL BLOOD: - MACROCYTIC ANEMIA.  Diagnosis Note The bone marrow is variably cellular with trilineage hematopoiesis and non specific myeloid changes. In this background, the plasma cell component is variable ranging from less than 10 % in many areas to 30% in some aspirate particles with relatively low cellularity. Foci of interstitial infiltrates and small clusters are also seen in the clot section. Immunohistochemical stains show kappa light chain restriction in atypical plasma cell clusters and infiltrates consistent with plasma cell neoplasm. Correlation with cytogenetic and FISH studies is recommended. (BNS:gt, 02-17-16)  Diagnosis 03/28/16 PLEURAL FLUID, LEFT (SPECIMEN 1 OF 1 COLLECTED 03/28/16): REACTIVE MESOTHELIAL CELLS  RADIOGRAPHIC STUDIES:  ECHO 03/12/17 Study Conclusions - Left ventricle: The cavity size was normal. Wall thickness was   increased in a pattern of mild LVH. Systolic function was normal.   The estimated ejection fraction was in the range of 50% to 55%.   There is hypokinesis of the basalinferior myocardium. Doppler   parameters are consistent with abnormal left ventricular   relaxation (grade 1 diastolic dysfunction). - Aortic valve: There was trivial regurgitation. - Mitral valve: Calcified annulus. Mildly thickened leaflets .   There was trivial regurgitation. - Tricuspid valve: There was mild regurgitation. Impressions: - There has been mild improvement in EF since prior study.    Echo 12/03/16 -Left Ventricle: Systolic function was   mildly to moderately reduced. The estimated ejection fraction was   in the range of 40% to 45%. Diffuse hypokinesis. Doppler  parameters are consistent with abnormal left ventricular relaxation (grade 1 diastolic dysfunction). Doppler parameters are consistent with indeterminate ventricular filling pressure. - Left atrium: The atrium was severely dilated. -  Tricuspid valve: There was trivial regurgitation. - Pulmonary arteries: Systolic pressure was within the normal   range. PA peak pressure: 33 mm Hg (S). - Global longitudinal strain -10.5% (abnormal)  Echo 08/26/2016 - Left ventricle: LVEF is approximately 35% with diffuse hypokinesis with severe hypokinesis/ akinesis of the inferior/inferoseptal walls. In comparison to echo images from December 2017, there does not appear to be significant change The cavity size was normal. Wall thickness was normal. Doppler parameters are consistent with abnormal left ventricular relaxation (grade 1 diastolic dysfunction). - Aortic valve: AV is thickened. There is a small mobile   echodenisity on ventricular surface consistent with possible fibroelastoma. Present in echo from December 2017 There was trivial regurgitation. - Right ventricle: Systolic function was mildly reduced. - Right atrium: The atrium was mildly dilated. - Pericardium, extracardiac: A trivial pericardial effusion was identified.  ASSESSMENT: Shannon Rush 76 y.o. female with a history of Multiple Myeloma s/p autotransplant in August 2014 followed by maintenance low dose Revlimid therapy, and relapsed in 01/2016  PLAN:   1. IgG kappa Multiple myeloma s/p Auto SCT, intermediate risk, relapsed in 01/2016 --MM markers demonstrate a very good initial response (M protein baseline 2.6, down to 0.18 after induction chemo and transplant, stable around 0.5 since July 2015, but has gradually increased lately),  she unfortunately relapsed in July 2017 -I previously discussed her repeated bone marrow biopsy from 02/01/2016, which showed 10-30% plasma cells in her marrow -I agreed with Dr. Kendell Bane recommendation to stop revlimid and change to Daratumumab, Pomalidomide and dexamethasone due to her disease relapse. I don't think she has resistance to Revlimid, she has been on very small dose of Revlimid due to her prior history of multiple  infections. -She started first cycle DaraPD in 02/2016, but in 2 weeks she developed severe pancytopenia, pneumonia with sepsis required 3 weeks hospital stay. She  did have a very nice response to the treatment, her M-protein has significantly reduced from 1.2 to 0.2, light chain levels normalized even with half cycle treatment  -I recommended her to change regiment to DaraVD, and started her on dara every 2 weeks for 2 doses, she tolerated this very well -she has had good response to treatment, M-protein significantly dropped, will get dara-specific IFE next time to see if she still has M-protein  -Her previous echo in December 2017 showed a significant drop on EF, she is not much symptomatically for CHF, due to the potential contribution of Velcade to her CHF, I'll hold her Velcade for now. Her repeated echo in May 2018 showed a significant improvement, EF 40-45%. -She is clinically doing very well, and has had good response to treatment. We'll continue Dara and dexa 28m every 4 weeks. -her dara-specific IFE in 12/2016 was still positive, she has low level M-protein, she has achieved a good partial response. -She hasn't had Velcade since December 2017. Will continue holding it for now due to her CHF -We discussed continuing monthly Dara treatment as maintenance therapy.  -She has peripheral neuropathy from her previous treatment. I discussed with her that we could place her on Neurotin; however, she would not like to be placed on anymore medications.  -Hgb was mildly lower, discussed with her to begin a multivitamin. She will consider adding this to her regimen.  -[facility]  -We discussed her ECHO from 03/12/17 that showed her heart function has normalized to EF of 50% to 55%.  -[facility] use a combination treatment of Dara + low dose Pomalidomide with and increase treatment to every 2 weeks.  -I  discussed the available data of Daratumumab as single agent or in combination with Pomalidomide or Velcade. Due to her previous severe cytopenia and infection, and a prolonged excellent response to single agent Dara, I did not recommend her to change to DaraPD again. We discussed that there is no data to support Dara every 2 weeks is better than every 4 weeks maintenance therapy at this point, however it is a logical suggestion, and I am not against Dr. GIsabella Bowensrecommendation -After a lengthy discussion, we decided to change Dara/dexa  treatment to every 2 weeks on 03/28/17, she has been tolerating well overall  -if she has disease progression, I would consider adding Velcade back -Labs reviewed, PLT is 138K but in safe range. Her Hg dropped to 10.5. I explained Dara will decrease her blood counts so we will continue to monitor. Her Sugar is 141, non-fasting.  -She will continue Dara/Dexa every 2 weeks  -I will post pone her next infusion [place]  -For her thin nail I suggest she take OTC biotin and make sure her nail does not get infected by doing regular hand hygiene.  -F/u in 5 weeks      2. Pancytopenia  -secondary Dara, slightly worse after we increased Dara to every 2 weeks  -Continue monitoring closely. -Blood counts stable except slight drop in Hg to 10.5.   3. CHF, CAD, inferior STEMI in 02/2014 -continue metoprolol and lipitor, Dr. NMeda Coffeepreviously started her on lisinopril also -previously followed up with Dr. NMeda Coffee -her previous echo on 08/26/16 showed improvement with EF 30-35% from 25-30%, further improved to 40-45%, echo in May 2018 -Dr. NMeda Coffeeplan to repeat her echo every  3 months when she is on treatment. -Her 03/12/17 ECHO shows improved heart function to normal range of EF at 50% to 55%. -I encouraged her to watch for leg swelling and SBO and chest pain as these are symptoms of her heart condition.   4. Osteoporosis -previous bone density scan showed  worsening osteoporosis, continue zometa, changed to every 3 months after 03/2015.  -she declined repeating DEXA  -she has been on zometa for 4 years, the risk is probably outweighs the benefit at this point, we have stopped it, last injection was 10/23/16  5. Peripheral neuropathy, G1-2 -Second 2 previous chemotherapy. Slightly worse lately. -We discussed the role of Neurontin, she declined.  6. Goal of care discussion  -We again discussed the incurable nature of her cancer, and the overall poor prognosis, especially if she does not have good response to chemotherapy or progress on chemo -The patient understands the goal of care is palliative. -she is full code now    Plan -Refill Dexamethasone today  -Lab, and Rapid daratumumab infusion in 3 weeks (or any day of that week), 5 and 7 weeks  -F/u in 5 weeks   All questions were answered. The patient knows to call the clinic with any problems, questions or concerns. We can certainly see the patient much sooner if necessary.  I spent 20 minutes counseling the patient face to face. The total time spent in the appointment was 25 minutes.  This document serves as a record of services personally performed by [name], MD. It was created on her behalf by Joslyn Devon, a trained medical scribe. The creation of this record is based on the scribe's personal observations and the provider's statements to them.    I have reviewed the above documentation for accuracy and completeness, and I agree with the above.     [name]  05/23/2017

## 2017-05-23 ENCOUNTER — Other Ambulatory Visit (HOSPITAL_BASED_OUTPATIENT_CLINIC_OR_DEPARTMENT_OTHER): Payer: Medicare Other

## 2017-05-23 ENCOUNTER — Ambulatory Visit (HOSPITAL_BASED_OUTPATIENT_CLINIC_OR_DEPARTMENT_OTHER): Payer: Medicare Other | Admitting: Hematology

## 2017-05-23 ENCOUNTER — Ambulatory Visit (HOSPITAL_BASED_OUTPATIENT_CLINIC_OR_DEPARTMENT_OTHER): Payer: Medicare Other

## 2017-05-23 VITALS — BP 115/54 | HR 65 | Temp 98.4°F | Resp 18

## 2017-05-23 VITALS — BP 138/75 | HR 76 | Temp 97.9°F | Resp 18 | Ht 68.0 in | Wt 125.5 lb

## 2017-05-23 DIAGNOSIS — T451X5A Adverse effect of antineoplastic and immunosuppressive drugs, initial encounter: Secondary | ICD-10-CM

## 2017-05-23 DIAGNOSIS — I509 Heart failure, unspecified: Secondary | ICD-10-CM

## 2017-05-23 DIAGNOSIS — M818 Other osteoporosis without current pathological fracture: Secondary | ICD-10-CM

## 2017-05-23 DIAGNOSIS — C9000 Multiple myeloma not having achieved remission: Secondary | ICD-10-CM

## 2017-05-23 DIAGNOSIS — Z5112 Encounter for antineoplastic immunotherapy: Secondary | ICD-10-CM | POA: Diagnosis not present

## 2017-05-23 DIAGNOSIS — D6481 Anemia due to antineoplastic chemotherapy: Secondary | ICD-10-CM

## 2017-05-23 DIAGNOSIS — C9002 Multiple myeloma in relapse: Secondary | ICD-10-CM

## 2017-05-23 DIAGNOSIS — I252 Old myocardial infarction: Secondary | ICD-10-CM

## 2017-05-23 LAB — COMPREHENSIVE METABOLIC PANEL
ALBUMIN: 3.9 g/dL (ref 3.5–5.0)
ALK PHOS: 47 U/L (ref 40–150)
ALT: 23 U/L (ref 0–55)
AST: 19 U/L (ref 5–34)
Anion Gap: 10 mEq/L (ref 3–11)
BILIRUBIN TOTAL: 0.88 mg/dL (ref 0.20–1.20)
BUN: 20.3 mg/dL (ref 7.0–26.0)
CO2: 21 mEq/L — ABNORMAL LOW (ref 22–29)
Calcium: 9.5 mg/dL (ref 8.4–10.4)
Chloride: 105 mEq/L (ref 98–109)
Creatinine: 0.8 mg/dL (ref 0.6–1.1)
EGFR: >60 mL/min/{1.73_m2} (ref >60)
GLUCOSE: 141 mg/dL — AB (ref 70–140)
Potassium: 4.2 mEq/L (ref 3.5–5.1)
SODIUM: 136 meq/L (ref 136–145)
TOTAL PROTEIN: 6.1 g/dL — AB (ref 6.4–8.3)

## 2017-05-23 LAB — CBC WITH DIFFERENTIAL/PLATELET
BASO%: 0.2 % (ref 0.0–2.0)
Basophils Absolute: 0 10*3/uL (ref 0.0–0.1)
EOS%: 0 % (ref 0.0–7.0)
Eosinophils Absolute: 0 10*3/uL (ref 0.0–0.5)
HEMATOCRIT: 32.1 % — AB (ref 34.8–46.6)
HEMOGLOBIN: 10.5 g/dL — AB (ref 11.6–15.9)
LYMPH#: 0.3 10*3/uL — AB (ref 0.9–3.3)
LYMPH%: 5.1 % — ABNORMAL LOW (ref 14.0–49.7)
MCH: 37.1 pg — ABNORMAL HIGH (ref 25.1–34.0)
MCHC: 32.7 g/dL (ref 31.5–36.0)
MCV: 113.4 fL — ABNORMAL HIGH (ref 79.5–101.0)
MONO#: 0.2 10*3/uL (ref 0.1–0.9)
MONO%: 4.4 % (ref 0.0–14.0)
NEUT#: 5 10*3/uL (ref 1.5–6.5)
NEUT%: 90.3 % — ABNORMAL HIGH (ref 38.4–76.8)
Platelets: 138 10*3/uL — ABNORMAL LOW (ref 145–400)
RBC: 2.83 10*6/uL — ABNORMAL LOW (ref 3.70–5.45)
RDW: 16.4 % — AB (ref 11.2–14.5)
WBC: 5.5 10*3/uL (ref 3.9–10.3)

## 2017-05-23 MED ORDER — DEXAMETHASONE 4 MG PO TABS: ORAL_TABLET | ORAL | 2 refills | Status: DC

## 2017-05-23 MED ORDER — SODIUM CHLORIDE 0.9 % IV SOLN
Freq: Once | INTRAVENOUS | Status: AC
  Administered 2017-05-23: 10:00:00 via INTRAVENOUS

## 2017-05-23 MED ORDER — MONTELUKAST SODIUM 10 MG PO TABS
ORAL_TABLET | ORAL | Status: AC
  Filled 2017-05-23: qty 1

## 2017-05-23 MED ORDER — METHYLPREDNISOLONE SODIUM SUCC 125 MG IJ SOLR
125.0000 mg | Freq: Once | INTRAMUSCULAR | Status: AC
  Administered 2017-05-23: 125 mg via INTRAVENOUS

## 2017-05-23 MED ORDER — METHYLPREDNISOLONE SODIUM SUCC 125 MG IJ SOLR
INTRAMUSCULAR | Status: AC
  Filled 2017-05-23: qty 2

## 2017-05-23 MED ORDER — MONTELUKAST SODIUM 10 MG PO TABS
10.0000 mg | ORAL_TABLET | Freq: Once | ORAL | Status: AC
  Administered 2017-05-23: 10 mg via ORAL

## 2017-05-23 MED ORDER — ACETAMINOPHEN 325 MG PO TABS
650.0000 mg | ORAL_TABLET | Freq: Once | ORAL | Status: AC
  Administered 2017-05-23: 650 mg via ORAL

## 2017-05-23 MED ORDER — DIPHENHYDRAMINE HCL 25 MG PO CAPS
50.0000 mg | ORAL_CAPSULE | Freq: Once | ORAL | Status: AC
  Administered 2017-05-23: 50 mg via ORAL

## 2017-05-23 MED ORDER — ACETAMINOPHEN 325 MG PO TABS
ORAL_TABLET | ORAL | Status: AC
  Filled 2017-05-23: qty 2

## 2017-05-23 MED ORDER — SODIUM CHLORIDE 0.9 % IV SOLN
16.0000 mg/kg | Freq: Once | INTRAVENOUS | Status: AC
  Administered 2017-05-23: 900 mg via INTRAVENOUS
  Filled 2017-05-23: qty 40

## 2017-05-23 MED ORDER — DIPHENHYDRAMINE HCL 25 MG PO CAPS
ORAL_CAPSULE | ORAL | Status: AC
  Filled 2017-05-23: qty 2

## 2017-05-23 NOTE — Patient Instructions (Signed)
[facility] Discharge Instructions for Patients Receiving Chemotherapy  Today you received the following chemotherapy agents: Darzalex  To help prevent nausea and vomiting after your treatment, we encourage you to take your nausea medication as directed.    If you develop nausea and vomiting that is not controlled by your nausea medication, call the clinic.   BELOW ARE SYMPTOMS THAT SHOULD BE REPORTED IMMEDIATELY:  *FEVER GREATER THAN 100.5 F  *CHILLS WITH OR WITHOUT FEVER  NAUSEA AND VOMITING THAT IS NOT CONTROLLED WITH YOUR NAUSEA MEDICATION  *UNUSUAL SHORTNESS OF BREATH  *UNUSUAL BRUISING OR BLEEDING  TENDERNESS IN MOUTH AND THROAT WITH OR WITHOUT PRESENCE OF ULCERS  *URINARY PROBLEMS  *BOWEL PROBLEMS  UNUSUAL RASH Items with * indicate a potential emergency and should be followed up as soon as possible.  Feel free to call the clinic should you have any questions or concerns. The clinic phone number is [phone].  Please show the CHEMO ALERT CARD at check-in to the Emergency Department and triage nurse.   

## 2017-05-26 LAB — PROTEIN ELECTROPHORESIS, SERUM
A/G RATIO SPE: 2.1 — AB (ref 0.7–1.7)
ALBUMIN: 3.9 g/dL (ref 2.9–4.4)
Alpha 1: 0.2 g/dL (ref 0.0–0.4)
Alpha 2: 0.5 g/dL (ref 0.4–1.0)
BETA: 0.7 g/dL (ref 0.7–1.3)
GAMMA GLOBULIN: 0.4 g/dL (ref 0.4–1.8)
GLOBULIN, TOTAL: 1.9 g/dL — AB (ref 2.2–3.9)
M-SPIKE, %: 0.1 g/dL — AB
TOTAL PROTEIN: 5.8 g/dL — AB (ref 6.0–8.5)

## 2017-05-27 LAB — IFE, DARA-SPECIFIC, SERUM
IGA/IMMUNOGLOBULIN A, SERUM: 6 mg/dL — AB (ref 64–422)
IGG (IMMUNOGLOBIN G), SERUM: 471 mg/dL — AB (ref 700–1600)
IGM (IMMUNOGLOBIN M), SRM: 17 mg/dL — AB (ref 26–217)

## 2017-05-28 ENCOUNTER — Telehealth: Payer: Self-pay | Admitting: Hematology

## 2017-05-28 DIAGNOSIS — Z85828 Personal history of other malignant neoplasm of skin: Secondary | ICD-10-CM | POA: Diagnosis not present

## 2017-05-28 DIAGNOSIS — L603 Nail dystrophy: Secondary | ICD-10-CM | POA: Diagnosis not present

## 2017-05-28 DIAGNOSIS — L570 Actinic keratosis: Secondary | ICD-10-CM | POA: Diagnosis not present

## 2017-05-28 NOTE — Telephone Encounter (Signed)
Called patient regarding current schedule °

## 2017-06-13 ENCOUNTER — Other Ambulatory Visit (HOSPITAL_BASED_OUTPATIENT_CLINIC_OR_DEPARTMENT_OTHER): Payer: Medicare Other

## 2017-06-13 ENCOUNTER — Ambulatory Visit (HOSPITAL_BASED_OUTPATIENT_CLINIC_OR_DEPARTMENT_OTHER): Payer: Medicare Other

## 2017-06-13 VITALS — BP 120/61 | HR 81 | Temp 97.9°F | Resp 18

## 2017-06-13 DIAGNOSIS — C9000 Multiple myeloma not having achieved remission: Secondary | ICD-10-CM | POA: Diagnosis not present

## 2017-06-13 DIAGNOSIS — D61818 Other pancytopenia: Secondary | ICD-10-CM

## 2017-06-13 DIAGNOSIS — Z5112 Encounter for antineoplastic immunotherapy: Secondary | ICD-10-CM | POA: Diagnosis not present

## 2017-06-13 DIAGNOSIS — C9002 Multiple myeloma in relapse: Secondary | ICD-10-CM | POA: Diagnosis not present

## 2017-06-13 DIAGNOSIS — I252 Old myocardial infarction: Secondary | ICD-10-CM | POA: Diagnosis not present

## 2017-06-13 DIAGNOSIS — M810 Age-related osteoporosis without current pathological fracture: Secondary | ICD-10-CM

## 2017-06-13 LAB — CBC WITH DIFFERENTIAL/PLATELET
BASO%: 0.1 % (ref 0.0–2.0)
Basophils Absolute: 0 10*3/uL (ref 0.0–0.1)
EOS ABS: 0 10*3/uL (ref 0.0–0.5)
EOS%: 0 % (ref 0.0–7.0)
HCT: 32.1 % — ABNORMAL LOW (ref 34.8–46.6)
HEMOGLOBIN: 10.8 g/dL — AB (ref 11.6–15.9)
LYMPH%: 4.8 % — AB (ref 14.0–49.7)
MCH: 36.4 pg — ABNORMAL HIGH (ref 25.1–34.0)
MCHC: 33.6 g/dL (ref 31.5–36.0)
MCV: 108.1 fL — ABNORMAL HIGH (ref 79.5–101.0)
MONO#: 0.6 10*3/uL (ref 0.1–0.9)
MONO%: 8.7 % (ref 0.0–14.0)
NEUT%: 86.4 % — ABNORMAL HIGH (ref 38.4–76.8)
NEUTROS ABS: 6.1 10*3/uL (ref 1.5–6.5)
PLATELETS: 166 10*3/uL (ref 145–400)
RBC: 2.97 10*6/uL — ABNORMAL LOW (ref 3.70–5.45)
RDW: 15.1 % — AB (ref 11.2–14.5)
WBC: 7.1 10*3/uL (ref 3.9–10.3)
lymph#: 0.3 10*3/uL — ABNORMAL LOW (ref 0.9–3.3)

## 2017-06-13 LAB — COMPREHENSIVE METABOLIC PANEL
ALT: 21 U/L (ref 0–55)
AST: 21 U/L (ref 5–34)
Albumin: 4.2 g/dL (ref 3.5–5.0)
Alkaline Phosphatase: 49 U/L (ref 40–150)
Anion Gap: 12 mEq/L — ABNORMAL HIGH (ref 3–11)
BUN: 26 mg/dL (ref 7.0–26.0)
CALCIUM: 9.6 mg/dL (ref 8.4–10.4)
CHLORIDE: 107 meq/L (ref 98–109)
CO2: 20 meq/L — AB (ref 22–29)
Creatinine: 0.9 mg/dL (ref 0.6–1.1)
EGFR: >60 mL/min/{1.73_m2} (ref >60)
Glucose: 118 mg/dl (ref 70–140)
POTASSIUM: 3.9 meq/L (ref 3.5–5.1)
Sodium: 140 mEq/L (ref 136–145)
Total Bilirubin: 0.68 mg/dL (ref 0.20–1.20)
Total Protein: 6.5 g/dL (ref 6.4–8.3)

## 2017-06-13 MED ORDER — MONTELUKAST SODIUM 10 MG PO TABS
10.0000 mg | ORAL_TABLET | Freq: Once | ORAL | Status: AC
  Administered 2017-06-13: 10 mg via ORAL

## 2017-06-13 MED ORDER — ACETAMINOPHEN 325 MG PO TABS
ORAL_TABLET | ORAL | Status: AC
  Filled 2017-06-13: qty 2

## 2017-06-13 MED ORDER — SODIUM CHLORIDE 0.9 % IV SOLN
16.0000 mg/kg | Freq: Once | INTRAVENOUS | Status: AC
  Administered 2017-06-13: 900 mg via INTRAVENOUS
  Filled 2017-06-13: qty 40

## 2017-06-13 MED ORDER — METHYLPREDNISOLONE SODIUM SUCC 125 MG IJ SOLR
INTRAMUSCULAR | Status: AC
  Filled 2017-06-13: qty 2

## 2017-06-13 MED ORDER — ACETAMINOPHEN 325 MG PO TABS
650.0000 mg | ORAL_TABLET | Freq: Once | ORAL | Status: AC
  Administered 2017-06-13: 650 mg via ORAL

## 2017-06-13 MED ORDER — DIPHENHYDRAMINE HCL 25 MG PO CAPS
50.0000 mg | ORAL_CAPSULE | Freq: Once | ORAL | Status: AC
  Administered 2017-06-13: 50 mg via ORAL

## 2017-06-13 MED ORDER — MONTELUKAST SODIUM 10 MG PO TABS
ORAL_TABLET | ORAL | Status: AC
  Filled 2017-06-13: qty 1

## 2017-06-13 MED ORDER — SODIUM CHLORIDE 0.9 % IV SOLN
Freq: Once | INTRAVENOUS | Status: AC
  Administered 2017-06-13: 11:00:00 via INTRAVENOUS

## 2017-06-13 MED ORDER — DIPHENHYDRAMINE HCL 25 MG PO CAPS
ORAL_CAPSULE | ORAL | Status: AC
  Filled 2017-06-13: qty 2

## 2017-06-13 MED ORDER — METHYLPREDNISOLONE SODIUM SUCC 125 MG IJ SOLR
125.0000 mg | Freq: Once | INTRAMUSCULAR | Status: AC
  Administered 2017-06-13: 125 mg via INTRAVENOUS

## 2017-06-13 NOTE — Patient Instructions (Signed)
[facility] Discharge Instructions for Patients Receiving Chemotherapy  Today you received the following chemotherapy agents: daratumumab (Darzalex).  To help prevent nausea and vomiting after your treatment, we encourage you to take your nausea medication as directed.   If you develop nausea and vomiting that is not controlled by your nausea medication, call the clinic.   BELOW ARE SYMPTOMS THAT SHOULD BE REPORTED IMMEDIATELY:  *FEVER GREATER THAN 100.5 F  *CHILLS WITH OR WITHOUT FEVER  NAUSEA AND VOMITING THAT IS NOT CONTROLLED WITH YOUR NAUSEA MEDICATION  *UNUSUAL SHORTNESS OF BREATH  *UNUSUAL BRUISING OR BLEEDING  TENDERNESS IN MOUTH AND THROAT WITH OR WITHOUT PRESENCE OF ULCERS  *URINARY PROBLEMS  *BOWEL PROBLEMS  UNUSUAL RASH Items with * indicate a potential emergency and should be followed up as soon as possible.  Feel free to call the clinic should you have any questions or concerns. The clinic phone number is [phone].  Please show the CHEMO ALERT CARD at check-in to the Emergency Department and triage nurse.   

## 2017-06-16 LAB — KAPPA/LAMBDA LIGHT CHAINS
IG KAPPA FREE LIGHT CHAIN: 6.5 mg/L (ref 3.3–19.4)
IG LAMBDA FREE LIGHT CHAIN: 2.3 mg/L — AB (ref 5.7–26.3)
KAPPA/LAMBDA FLC RATIO: 2.83 — AB (ref 0.26–1.65)

## 2017-06-18 ENCOUNTER — Ambulatory Visit: Payer: Medicare Other | Admitting: Cardiology

## 2017-06-19 ENCOUNTER — Encounter: Payer: Self-pay | Admitting: Cardiology

## 2017-06-19 ENCOUNTER — Ambulatory Visit: Payer: Medicare Other | Admitting: Cardiology

## 2017-06-19 VITALS — BP 122/62 | HR 85 | Ht 68.0 in | Wt 122.0 lb

## 2017-06-19 DIAGNOSIS — I358 Other nonrheumatic aortic valve disorders: Secondary | ICD-10-CM

## 2017-06-19 DIAGNOSIS — Z79899 Other long term (current) drug therapy: Secondary | ICD-10-CM | POA: Diagnosis not present

## 2017-06-19 DIAGNOSIS — I252 Old myocardial infarction: Secondary | ICD-10-CM | POA: Diagnosis not present

## 2017-06-19 DIAGNOSIS — I2510 Atherosclerotic heart disease of native coronary artery without angina pectoris: Secondary | ICD-10-CM | POA: Diagnosis not present

## 2017-06-19 DIAGNOSIS — I429 Cardiomyopathy, unspecified: Secondary | ICD-10-CM | POA: Diagnosis not present

## 2017-06-19 DIAGNOSIS — I359 Nonrheumatic aortic valve disorder, unspecified: Secondary | ICD-10-CM

## 2017-06-19 DIAGNOSIS — I428 Other cardiomyopathies: Secondary | ICD-10-CM | POA: Diagnosis not present

## 2017-06-19 MED ORDER — ATORVASTATIN CALCIUM 20 MG PO TABS: 20.0000 mg | ORAL_TABLET | Freq: Every day | ORAL | 3 refills | Status: DC

## 2017-06-19 NOTE — Patient Instructions (Signed)
Medication Instructions:   DECREASE YOUR ATORVASTATIN TO 20 MG ONCE DAILY    Testing/Procedures:  Your physician has requested that you have an echocardiogram. Echocardiography is a painless test that uses sound waves to create images of your heart. It provides your doctor with information about the size and shape of your heart and how well your heart's chambers and valves are working. This procedure takes approximately one hour. There are no restrictions for this procedure.    Follow-Up:  3 MONTHS WITH DR Meda Coffee       If you need a refill on your cardiac medications before your next appointment, please call your pharmacy. \

## 2017-06-19 NOTE — Progress Notes (Signed)
Cardiology Office Note    Date:  06/20/2017   ID:  Shannon Rush, DOB September 05, 1940, MRN [id number]  PCP:  [name], MD  Cardiologist:  Dr. Meda Coffee  CC: follow up- cardiomyopathy     History of Present Illness:  Shannon Rush is a 76 y.o. female with a history of multiple myeloma s/p bone marrow transplant (followed at [facility] [place] by Dr. [name] and in [facility] by Dr. Juliann Mule), osteoporosis, HLD and CAD: inferior STEMI s/p thrombolytics (02/2014) who presents to clinic for follow up.   Patient was seen by Dr. Johnsie Cancel in 12/2012. There were reports of an abnormal echocardiogram. 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   Admitted 8/18-8/22/15 to the [facility] with an inferior STEMI. She received thrombolytics. Cardiac catheterization demonstrated 60% proximal RCA, mid to distal RCA 50% (lesion segmental and thrombotic). Echocardiogram demonstrated normal LV function. Patient was continued on heparin and aspirin. No intervention was performed. Recommendation was for the patient to return to [facility] and have a nuclear stress test in 6 weeks. Patient was on Revlimid (for pancytopenia from chemotherapy) and the DC summary from the hospital in [place] cites this as a possible cause for her STEMI.   She has a known hx of multiple myeloma since 2013 treated with Revlimid, recently switched to pomalidomide, dexamethasone, daratumumab with resultant pancytopenia. She was admitted from 8/28-9/15/17 for sepsis and acute respiratory failure 2/2 HCAP with acute on chronic systolic CHF with pleural effusions. The pt was shocky and required IVF, transfer to ICU, and IV pressors. She was noted to be thrombocytopenic and her Plavix was stopped. She improved initially but has had persistent hypoxia. Chest CT 03/24/16 showed moderate bilateral effusions. She was given Lasix  but this has been stopped secondary to hypotension. Echo done 03/24/16 showed an EF of 45-50% with grade 2 DD. She required bilateral thoracocentesis and IV diuresis. She also received blood transfusions with symptomatic improvement. Discharged on 62m Lasix PRN. Her MM drug regimen was held after this hospitalization due to severe cytopenia. Per Dr. NMeda Coffee she will need a repeat 2D echo every 3 months while on this chemo regimen.  I saw her for post hospital follow up on 04/18/16. She was improving but still felt weak. I ordered a TTE in 3 months per Dr. NFrancesca Omanrecommendation. This was performed on 06/27/16 and showed EF down to 25-30% and a highly mobile echodensity attached to the aortic sideof the left coronary leaflet measuring 1 x 0.7 x 0.4 cm. I discussed with Dr. NMeda Coffeewho felt the worsening cardiomyopathy was likely secondary to daratumumab and the patient needed a TEE to further investigate aortic mass.   Daratumumab was restarted on 04/26/2016 and Velcade 1.324mm2 and dexa 2079meekly started on 11/3. Also on Zometa 3.5 mg monthly started on 06/2013 given every 3 months (last 05/03/2016).   I talked to Dr. FenBurr Medico the phone today about this patient. She was recently seen in the office by Dr. FenBurr Medico 06/28/16 and reported a productive cough x2-3 weeks. She was started on Levaquin and her daratumumab was held with plans to restart after the holidays after she had recovered from possible infection. She is currently in a relapse of her MM but had been responding quite well to treatment despite recurrent PNA. Dr. FenBurr Medicoels like she has at least 1 year to live and thought  TEE was appropriate if necessary. She also was willing to hold Daratumumab to see if her EF would improve.   07/24/2016 - this is a very delightful patient who is coming back for follow-up after 1 month. In the last months she feels like she has more energy she denies any lower extremity edema she is no shortness of breath and in  fact she rode her bike here from [facility]. Because of new further decrease of her LVEF from 45-50% to 25-30% she was started on low-dose of lisinopril and continued on Toprol-XL. She has no side effects from these medications. Dr. Burr Medico feels that her decrease in LVEF is secondary to pomalidomide. She denies any chest pain, no orthopnea proximal nocturnal dyspnea no palpitations or syncope.  09/09/2016 -this is 6 weeks follow-up, the patient is feeling very well and is planning trip to [place]. Her most recent echocardiogram showed improvement of LVEF from 25-30% to 35%. She did have a very nice response to the treatment, her M-protein has significantly reduced from 1.2 to 0.2, light chain levels normalized even with half cycle treatment. Dr Burr Medico is changing regiment to DaraVD, and started her on dara every 2 weeks for 2 doses. She denies any palpitations no dizziness or syncope. She continues to exercise and denies any chest pain or shortness of breath she is no lower extremity edema and worse compression stockings. She uses Lasix just as needed only needed to use it once in the last 6 weeks.  12/04/2016 - 3 months follow up, no change in symptoms, the patient remains very active, rides a bike to the clinic and everywhere else.  She denies CP, DOE, but has noticed mild LE edema R>L for about a week, no associated pain. No SOB, no orthopnea or PND.  06/19/2017 - the patient feels and looks great, again she comes to the office riding a bike and denies any chest pain or shortness of breath. She has no lower extremity edema orthopnea proximal nocturnal dyspnea no palpitations dizziness or syncope. She was previously on combination of Daratuzumab, Pomalidomide and Velcade ( DaraPD), currently on Dara/dexa every other week since 03/2017 and she is tolerating it well. Her most recent LVEF from 02/2017 showed LVEF 50-55%.  Past Medical History:  Diagnosis Date  . CAD (coronary artery disease)    a. inf STEMI  (in [place] >>> [facility]ytics) >>> LHC (8/15- [place]):  pRCA 60%, mid to dist RCA 50% >>> med rx  . Cord compression ([place]) 07/13/12   MRI- diffuse myeloma involvement of T-L spine  . History of radiation therapy 07/13/12-07/27/12   spinal cord compression T3-T10,left scapula  . Hx of echocardiogram    Echo (03/02/14-done at [facility]):  Normal LVEF without significant regional wall motion abnormalities. Mild   . Multiple myeloma ([place]) 07/01/2012  . OSTEOPOROSIS 06/11/2010   Multiple compression fractures; and spontaneous fracture of sternum Qualifier: Diagnosis of  By: [name], [name]     . Thoracic kyphosis 07/13/12   per MRI scan  . Unspecified deficiency anemia     Past Surgical History:  Procedure Laterality Date  . APPENDECTOMY    . CESAREAN SECTION     x2   . COLONOSCOPY  2007   neg with Dr. [name]  . ELBOW SURGERY    . HIP SURGERY  2009   left  . TUBAL LIGATION      Current Medications: Outpatient Medications Prior to Visit  Medication Sig Dispense Refill  .  acetaminophen (TYLENOL) 500 MG tablet Take 500 mg by mouth every 6 (six) hours as needed for moderate pain (pain). Reported on 07/20/2015    . acyclovir (ZOVIRAX) 400 MG tablet Take 1 tablet (400 mg total) by mouth 2 (two) times daily. 60 tablet 11  . aspirin EC 81 MG tablet Take 1 tablet (81 mg total) by mouth daily. 90 tablet 3  . calcium carbonate (OS-CAL) 600 MG TABS tablet Take 600 mg by mouth daily.    Marland Kitchen dexamethasone (DECADRON) 4 MG tablet Take 5 tab the day before chemo, every 2 weeks 30 tablet 2  . fluticasone (FLONASE) 50 MCG/ACT nasal spray Place 2 sprays into both nostrils daily. 16 g 1  . furosemide (LASIX) 20 MG tablet Take 20 mg by mouth 2 (two) times daily as needed for edema.    Marland Kitchen lisinopril (PRINIVIL,ZESTRIL) 2.5 MG tablet Take 1 tablet (2.5 mg total) by mouth daily. 90 tablet 3  . metoprolol succinate (TOPROL-XL) 25 MG 24 hr tablet Take 1 tablet (25 mg total)  by mouth daily. 90 tablet 3  . nitroGLYCERIN (NITROSTAT) 0.4 MG SL tablet Place under the tongue as needed. Reported on 07/20/2015    . ondansetron (ZOFRAN) 8 MG tablet Take 1 tablet (8 mg total) by mouth 2 (two) times daily as needed (Nausea or vomiting). 30 tablet 1  . PROAIR HFA 108 (90 BASE) MCG/ACT inhaler Inhale 2 puffs into the lungs every 6 (six) hours as needed.  2  . prochlorperazine (COMPAZINE) 10 MG tablet Take 1 tablet (10 mg total) by mouth every 6 (six) hours as needed (Nausea or vomiting). 30 tablet 1  . atorvastatin (LIPITOR) 40 MG tablet Take 1 tablet (40 mg total) by mouth daily. 90 tablet 1   Facility-Administered Medications Prior to Visit  Medication Dose Route Frequency Provider Last Rate Last Dose  . 0.9 %  sodium chloride infusion  250 mL Intravenous Once [name], MD      . 0.9 %  sodium chloride infusion  250 mL Intravenous Once [name], MD      . acetaminophen (TYLENOL) tablet 650 mg  650 mg Oral Once [name], MD      . acetaminophen (TYLENOL) tablet 650 mg  650 mg Oral Once [name], MD      . furosemide (LASIX) injection 10 mg  10 mg Intravenous Once [name], MD      . furosemide (LASIX) injection 10 mg  10 mg Intravenous Once [name], MD      . heparin lock flush 100 unit/mL  500 Units Intracatheter Daily PRN [name], MD      . heparin lock flush 100 unit/mL  250 Units Intracatheter PRN [name], MD      . heparin lock flush 100 unit/mL  500 Units Intracatheter Daily PRN [name], MD      . heparin lock flush 100 unit/mL  250 Units Intracatheter PRN [name], MD      . sodium chloride flush (NS) 0.9 % injection 10 mL  10 mL Intracatheter PRN [name], MD      . sodium chloride flush (NS) 0.9 % injection 10 mL  10 mL Intracatheter PRN [name], MD      . sodium chloride flush (NS) 0.9 % injection 10 mL  10 mL Intracatheter PRN [name], MD      . sodium chloride flush (NS) 0.9 % injection 3 mL  3 mL Intracatheter PRN [name], MD      .  sodium  chloride flush (NS) 0.9 % injection 3 mL  3 mL Intracatheter PRN [name], MD         Allergies:   Zosyn [piperacillin sod-tazobactam so]; Levaquin [levofloxacin in d5w]; Other; Piperacillin-tazobactam in dex; Zithromax [azithromycin]; and Ciprofloxacin   Social History   Socioeconomic History  . Marital status: Married    Spouse name: Fritz Pickerel  . Number of children: 2  . Years of education: None  . Highest education level: None  Social Needs  . Financial resource strain: None  . Food insecurity - worry: None  . Food insecurity - inability: None  . Transportation needs - medical: None  . Transportation needs - non-medical: None  Occupational History  . Occupation: Museum/gallery conservator: [employer]  Tobacco Use  . Smoking status: Never Smoker  . Smokeless tobacco: Never Used  Substance and Sexual Activity  . Alcohol use: Yes    Alcohol/week: 3.5 oz    Types: 7 Standard drinks or equivalent per week    Comment: 1 glass of wine or beer 3-5 days per week  . Drug use: No  . Sexual activity: Yes  Other Topics Concern  . None  Social History Narrative   Health Care POA:    Emergency Contact: spouse, Alley Neils [phone]   End of Life Plan:    Who lives with you: spouse, 2 story home   Any pets: Dog, "AKU"   Diet: Patient has a varied diet of protein, vegetables, starches.  Limits red meats.   Exercise: Patient exercises atleast 3 times a week for over an hour.   Seatbelts: Patient reports wearing her seatbelt when in vehicle.    Nancy Fetter Exposure/Protection: Patient reports wearing sunscreen daily.   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 Ducatte, RN, BSN                                                                                                        Family History:  The patient's family history includes Cancer in her brother; Glaucoma in her father and mother; Heart disease in her father and mother; Heart failure in her father; Peripheral vascular disease in her mother; Raynaud syndrome in her daughter, daughter, and mother.     ROS:   Please see the history of present illness.    ROS All other systems reviewed and are negative.  PHYSICAL EXAM:   VS:  BP 122/62   Pulse 85   Ht  5' 8"  (1.727 m)   Wt 122 lb (55.3 kg)   BMI 18.55 kg/m    GEN: Well nourished, well developed, in no acute distress  HEENT: normal  Neck: no JVD, carotid bruits, or masses Cardiac: RRR; no murmurs, rubs, or gallops, mild LE edema R>L Respiratory:  clear to auscultation bilaterally, normal work of breathing GI: soft, nontender, nondistended, + BS MS: no deformity or atrophy  Skin: warm and dry, no rash Neuro:  Alert and Oriented x 3, Strength and sensation are intact Psych: euthymic mood, full affect  Wt Readings from Last 3 Encounters:  06/19/17 122 lb (55.3 kg)  05/23/17 125 lb 8 oz (56.9 kg)  04/25/17 122 lb 11.2 oz (55.7 kg)    Studies/Labs Reviewed:   EKG:  EKG is NOT ordered today.   Recent Labs: 06/13/2017: ALT 21; BUN 26.0; Creatinine 0.9; HGB 10.8; Platelets 166; Potassium 3.9; Sodium 140   Lipid Panel    Component Value Date/Time   CHOL 121 12/31/2016 1543   TRIG 100 12/31/2016 1543   HDL 62 12/31/2016 1543   CHOLHDL 2.0 12/31/2016 1543   CHOLHDL 2.8 Ratio 06/02/2009 2018   VLDL 14 06/02/2009 2018   LDLCALC 39  12/31/2016 1543    Additional studies/ records that were reviewed today include:  2D ECHO: 03/24/2016 LV EF: 45% -   50% Study Conclusions - Left ventricle: The cavity size was normal. Wall thickness was   normal. Systolic function was mildly reduced. The estimated   ejection fraction was in the range of 45% to 50%. Features are   consistent with a pseudonormal left ventricular filling pattern,   with concomitant abnormal relaxation and increased filling   pressure (grade 2 diastolic dysfunction). - Mitral valve: There was mild regurgitation. - Left atrium: The atrium was mildly dilated.  2D ECHO: 06/27/2016 LV EF: 25% -   30% Study Conclusions - Left ventricle: The cavity size was mildly dilated. Systolic   function was severely reduced. The estimated ejection fraction   was in the range of 25% to 30%. Diffuse hypokinesis. Doppler   parameters are consistent with abnormal left ventricular   relaxation (grade 1 diastolic dysfunction). Doppler parameters   are consistent with intermediate end-diastolic filling pressure. - Aortic valve: Trileaflet; normal thickness leaflets. There was   trivial regurgitation. - Aortic root: The aortic root was normal in size. - Mitral valve: Structurally normal valve. There was mild   regurgitation. - Right ventricle: The cavity size was normal. Wall thickness was   normal. Systolic function was normal. - Right atrium: The atrium was normal in size. - Tricuspid valve: There was trivial regurgitation. - Pulmonic valve: There was trivial regurgitation. - Pulmonary arteries: Systolic pressure was within the normal   range. - Inferior vena cava: The vessel was dilated. The respirophasic   diameter changes were blunted (< 50%), consistent with elevated   central venous pressure. - Pericardium, extracardiac: There was no pericardial effusion. Impressions: - LVEF is severely decreased estimated at 25-30% with diffuse   hypokinesis.   There is a  highly mobile echodensity attached to the aortic side   of the left coronary leaflet measuring 1 x 0.7 x 0.4 cm. The   differential diagnosis includes inferctious endocarditis,   marantic endocarditis, thrombus, papillary fibroelastoma. Further   evaluation with a TEE is recommended.  TTE: 08/2016 Left ventricle: LVEF is approximately 35% with diffuse diffuse   hypokinesis with severe hypokinesis/ akinesis of the   inferior/inferoseptal walls. In comparison to  echo images from   December 2017, there does not appear to be significant change The   cavity size was normal. Wall thickness was normal. Doppler   parameters are consistent with abnormal left ventricular   relaxation (grade 1 diastolic dysfunction). - Aortic valve: AV is thickened. 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 Present in echo from December 2017 There was   trivial regurgitation. - Right ventricle: Systolic function was mildly reduced. - Right atrium: The atrium was mildly dilated. - Pericardium, extracardiac: A trivial pericardial effusion was   identified.  TTE: 11/2016\Left ventricle: The cavity size was normal. Systolic function was   mildly to moderately reduced. The estimated ejection fraction was   in the range of 40% to 45%. Diffuse hypokinesis. Doppler   parameters are consistent with abnormal left ventricular   relaxation (grade 1 diastolic dysfunction). Doppler parameters   are consistent with indeterminate ventricular filling pressure. - Aortic valve: Transvalvular velocity was within the normal range.   There was no stenosis. There was trivial regurgitation. - Left atrium: The atrium was severely dilated. - Right ventricle: The cavity size was normal. Wall thickness was   normal. Systolic function was normal. - Tricuspid valve: There was trivial regurgitation. - Pulmonary arteries: Systolic pressure was within the normal   range. PA peak  pressure: 33 mm Hg (S). - Global longitudinal strain -10.5% (abnormal)     ASSESSMENT & PLAN:   Shannon Rush is a 76 y.o. female with a history of multiple myeloma s/p bone marrow transplant (followed at [facility] by Dr. [name] and in [place] by Dr. Burr Medico), osteoporosis, HLD and CAD: inferior STEMI s/p thrombolytics (02/2014) who presents to clinic for follow up.   1. Acute on Chronic systolic CHF,dilated cardiomyopathy: EF 45-50% in 03/2016, decreased to 25-30% in December 2017 on velcade and daratuzumab for relapsing multiple myeloma. Previously on Pomalidomide. Dr Burr Medico is changing regiment to Dara/dexa in 03/2017, most recent LVEF in 02/2017 50-55%.  She is euvolemic.  2. RLE edema - concern for DVT in the settings of existing cancer, LE venous Duplex was negative.   3. Aortic mobile echodensity: attached to the aortic side of the left coronary leaflet measuring 1 x 0.7 x 0.4 cm. At first plan was to perform a TEE but then review of previous echo from [facility] in 2014 showed linear mass on aortic valve that is likely the same mass that has been stable over time: No need for TEE.  She has no signs of infective endocarditis.  4. Multiple myeloma s/p bone marrow transplant: (followed at [facility] by Dr. [name] and in [facility] by Dr. Burr Medico). Chemo as above.   5. HLD: continue statin  6. CAD: inferior STEMI s/p thrombolytics (02/2014). Plavix recent discontinued 2/2 thrombocytopenia but it was resumed at discharge. Continue plavix, statin and BB, she will elect to cut down on amount of medications, will decrease atorvastatin to 20 mg daily.  7. Easy bruising - improved with discontinuation of Plavix, start ASA.   Follow up in 3 months, repeat echo now.  Medication Adjustments/Labs and Tests Ordered: Current medicines are reviewed at length with the patient today.  Concerns regarding medicines are outlined above.  Medication changes, Labs and Tests ordered today are listed in the Patient  Instructions below. Patient Instructions  Medication Instructions:   DECREASE YOUR ATORVASTATIN TO 20 MG ONCE DAILY    Testing/Procedures:  Your physician has requested that you have an echocardiogram.  Echocardiography is a painless test that uses sound waves to create images of your heart. It provides your doctor with information about the size and shape of your heart and how well your heart's chambers and valves are working. This procedure takes approximately one hour. There are no restrictions for this procedure.    Follow-Up:  3 MONTHS WITH DR Meda Coffee       If you need a refill on your cardiac medications before your next appointment, please call your pharmacy. \    Signed, Ena Dawley, MD  06/20/2017 3:46 PM    [address], [place], [place]  [address] Phone: [phone]; Fax: [phone]

## 2017-06-26 ENCOUNTER — Ambulatory Visit (HOSPITAL_COMMUNITY): Payer: Medicare Other | Attending: Cardiovascular Disease

## 2017-06-26 ENCOUNTER — Other Ambulatory Visit: Payer: Self-pay

## 2017-06-26 DIAGNOSIS — I509 Heart failure, unspecified: Secondary | ICD-10-CM | POA: Diagnosis not present

## 2017-06-26 DIAGNOSIS — Z79899 Other long term (current) drug therapy: Secondary | ICD-10-CM

## 2017-06-26 DIAGNOSIS — I252 Old myocardial infarction: Secondary | ICD-10-CM | POA: Diagnosis not present

## 2017-06-26 DIAGNOSIS — Z9221 Personal history of antineoplastic chemotherapy: Secondary | ICD-10-CM | POA: Diagnosis not present

## 2017-06-26 DIAGNOSIS — I2510 Atherosclerotic heart disease of native coronary artery without angina pectoris: Secondary | ICD-10-CM | POA: Diagnosis not present

## 2017-06-26 DIAGNOSIS — Z8249 Family history of ischemic heart disease and other diseases of the circulatory system: Secondary | ICD-10-CM | POA: Diagnosis not present

## 2017-06-26 DIAGNOSIS — E785 Hyperlipidemia, unspecified: Secondary | ICD-10-CM | POA: Diagnosis not present

## 2017-06-26 DIAGNOSIS — Z7969 Long term (current) use of other immunomodulators and immunosuppressants: Secondary | ICD-10-CM

## 2017-06-26 NOTE — Progress Notes (Signed)
[name] OFF[facility] PROGRESS NOTE  PCP: [name], MD [place] [address]  DIAGNOSIS: Multiple myeloma with relapse  CHIEF COMPLAINTS: Follow-up of multiple myeloma  Oncology History   Multiple myeloma ([name])   Staging form: Multiple Myeloma, AJCC 6th Edition   - Clinical: Stage IIIA - Signed by [name], MD on 09/04/2013      Multiple myeloma ([name])   05/26/2012 Initial Diagnosis    Presenting IgG was 4,040 mg/dL on 06/05/2012 (IgA 35; IgM 34); kappa free light chain 34.4 mg/dL, lambda 0.00, kappa:lambda ratio of 34.75; SPEP with M-spike of 2.60.      06/30/2012 Imaging    Numerous lytic lesions throughout the calvarium.  Lytic lesion within the left lateral scapula.  Findings most compatible with metastases or myeloma. Multiple mid and lower thoracic compression fractures.  Slight compression through the endplates at L3.      85/88/5027 Bone Marrow Biopsy    Bone marrow biopsy showed 49% plasma cell. Normal classical cytogenetics; however, myeloma FISH panel showed 13q- (intermediate risk)          07/14/2012 - 07/27/2012 Radiation Therapy    Palliative radiation 20 Gy over 10 fractions between to thoracic spine cord compression and symptomatic left scapula lytic lesion.      08/10/2012 - 11/2012 Chemotherapy    Started SQ Velcade once weekly, 3 weeks on, 1 weeks off; daily Revlimid d1-21, 7 days off; and Dexamethasone 76m PO weekly.       02/12/2013 Bone Marrow Transplant    [facility]       06/14/2013 Tumor Marker    IgG  1830,  Kappa:lamba ratio, 1.69 (baseline was 34.75 or   M-spike 0.28 (baseline of 2.6 or  89.3% of baseline).        06/25/2013 -  Chemotherapy    Started zometa 3.5 mg monthly       09/01/2013 - 01/2014 Chemotherapy    Maintenance therapy with revlimid 581mdaily for 14 days and then off for 7 (decreased from 21 days on and 7 days off based on neutropenia). Stopped due  to disease progression 01/2017      09/13/2013 - 09/17/2014 Hospital Admission    Hospital admission for pneumonia      12/20/2013 Treatment Plan Change    Maintenance therapy decreased to 2.5 mg daily for 21 days and then off for 7 days based on low counts.       01/25/2014 Tumor Marker    IgG 1360 M spike 0.5      03/01/2014 - 03/05/2014 Hospital Ad[facility], received thrombolytic therapy, cath showed 60% prox RCA, mid-distal RCA 50%, Echo normal, no stents.      04/11/2014 - 08/07/2014 Chemotherapy    Continue Revlimid at 2.5 mg 3 weeks on 1 week off      08/08/2014 - 08/13/2014 Hospital Admission    Hospital admission for pneumonia. Her Revlimid was held.      08/29/2014 - 09/14/2014 Chemotherapy    Maintenance Revlimid restarted      09/14/2014 - 09/17/2014 Hospital Admission    [place]      11/13/2014 - 12/25/2015 Chemotherapy    Maintenance Revlimid restarted, changed to 2.64m77maily, 2 weks on, 1 week off from 12/15/2014, stopped due to disease progression  01/24/2016 Progression    Patient is M protein has gradually increased to 1.0g, repeated a bone marrow biopsy showed plasma cell 10-30%      02/27/2016 - 03/07/2016 Chemotherapy    Pomalidomide 4 mg daily on day 1-21, dexamethasone 40 mg on day 1, 8 and 15, every 28 days, started on 02/27/2016, daratumumab weekly started on 03/07/2016, held after first dose, due to hospitalization and a severe pancytopenia, pomalidomide was stopped afterwards       03/10/2016 - 03/29/2016 Hospital Admission    Pt was admitted for sepsis from pneumonia, and severe pancytopenia. She required ICU stay for a few days due to hypotension, developed b/l pleural effusion required diuretics and b/l thoracentesis. She also required blood and plt transfusion, and prolonged neupogen injection for severe  neutropenia. She was discharged home after 3 weeks hospital stay       04/26/2016 -  Chemotherapy    Daratumumab restarted on 04/26/2016, Velcade 1.44m/m2 and dexa 211mweekly start on 11/3, velcade held since 06/28/2016 due to CHF       08/26/2016 Echocardiogram    - Left ventricle: LVEF is approximately 35% with diffuse diffuse   hypokinesis with severe hypokinesis/ akinesis of the   inferior/inferoseptal walls. In comparison to echo images from   December 2017, there does not appear to be significant change The   cavity size was normal. Wall thickness was normal. Doppler   parameters are consistent with abnormal left ventricular   relaxation (grade 1 diastolic dysfunction). - Aortic valve: AV is thickened. 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 Present in echo from December 2017 There was   trivial regurgitation. - Right ventricle: Systolic function was mildly reduced. - Right atrium: The atrium was mildly dilated. - Pericardium, extracardiac: A trivial pericardial effusion was   identified.      12/03/2016 Echocardiogram    -Left Ventricle: Systolic function was   mildly to moderately reduced. The estimated ejection fraction was   in the range of 40% to 45%. Diffuse hypokinesis. Doppler   parameters are consistent with abnormal left ventricular   relaxation (grade 1 diastolic dysfunction). Doppler parameters   are consistent with indeterminate ventricular filling pressure. - Left atrium: The atrium was severely dilated. - Tricuspid valve: There was trivial regurgitation. - Pulmonary arteries: Systolic pressure was within the normal   range. PA peak pressure: 33 mm Hg (S). - Global longitudinal strain -10.5% (abnormal)      03/12/2017 Echocardiogram    ECHO 03/12/17 Study Conclusions - Left ventricle: The cavity size was normal. Wall thickness was   increased in a pattern of mild LVH. Systolic function was  normal.   The estimated ejection fraction was in the range of 50% to 55%.   There is hypokinesis of the basalinferior myocardium. Doppler   parameters are consistent with abnormal left ventricular   relaxation (grade 1 diastolic dysfunction). - Aortic valve: There was trivial regurgitation. - Mitral valve: Calcified annulus. Mildly thickened leaflets .   There was trivial regurgitation. - Tricuspid valve: There was mild regurgitation. Impressions: - There has been mild improvement in EF since prior study.         CURRENT TREATMENT:   1. Pomalidomide 4 mg daily on day 1-21, dexamethasone 40 mg on day 1, 8 and 15, every 28 days, started on 02/27/2016, daratumumab weekly started on 03/07/2016, treatment on hold since 03/09/2016 due to hospitalization and severe cytopenia 2. Daratumumab restarted on  04/26/2016, Velcade 1.'3mg'$ /m2 and dexa '20mg'$  weekly start on 11/3, velcade held since 06/28/2016 due to CHF. Tonette Bihari was changed from every 4 weeks to every 2 weeks on 03/28/2017 per Dr. Kendell Bane recommendation    INTERVAL HISTORY:  Shannon Rush 76 y.o. female with a past medical history of MM as detailed above, s/p SCT is here for follow-up.  She presents to the clinic today noting she has been doing her dara every 2 weeks. She notes she may need every 3 weeks around Christmas Holiday.  She notes she has diaphragm cramping from the muscles. She also have cramping in her hands and lower legs as well. The last few days this has worsened. She does no think this is related to her steroids. She does not think she drinks enough water. She is still taking calcium, vitamin D and magnesium.  She tries to eat a banana everyday. She has not noticed much change since increasing her dara to every 3 weeks. She feels change in her sleep from steroids and red flushing of her face.    MEDICAL HISTORY: Past Medical History:  Diagnosis Date  . CAD (coronary artery disease)    a. inf STEMI (in [place] >>>  lytics) >>> LHC (8/15- [place]):  pRCA 60%, mid to dist RCA 50% >>> med rx  . Cord compression ([place]) 07/13/12   MRI- diffuse myeloma involvement of T-L spine  . History of radiation therapy 07/13/12-07/27/12   spinal cord compression T3-T10,left scapula  . Hx of echocardiogram    Echo (03/02/14-done at [facility]):  Normal LVEF without significant regional wall motion abnormalities. Mild   . Multiple myeloma ([place]) 07/01/2012  . OSTEOPOROSIS 06/11/2010   Multiple compression fractures; and spontaneous fracture of sternum Qualifier: Diagnosis of  By: [name], [name]     . Thoracic kyphosis 07/13/12   per MRI scan  . Unspecified deficiency anemia    ALLERGIES:  is allergic to zosyn [piperacillin sod-tazobactam so]; levaquin [levofloxacin in d5w]; other; piperacillin-tazobactam in dex; zithromax [azithromycin]; and ciprofloxacin.  SURGICAL HISTORY:  Past Surgical History:  Procedure Laterality Date  . APPENDECTOMY    . CESAREAN SECTION     x2   . COLONOSCOPY  2007   neg with Dr. [name]  . ELBOW SURGERY    . HIP SURGERY  2009   left  . TUBAL LIGATION     REVIEW OF SYSTEMS:  Constitutional: Denies abnormal weight loss( +) lessened, lighter sleep and red flushing of face, related to steriods Eyes: Denies blurriness of vision Ears, nose, mouth, throat, and face: Denies mucositis or sore throat,  Respiratory: Denies dyspnea or wheezes Cardiovascular: Denies palpitation, chest discomfort  Gastrointestinal:  Denies nausea, heartburn or change in bowel habits Skin: Denies abnormal skin rashes (+) thin nail on left index finger Lymphatics: Denies new lymphadenopathy or easy bruising MSK: (+) muscle cramping in lower legs, hands and diaphragm  Neurological:Denies new numbness, tingling or new weaknesses. Positive for paraesthesias and numbness to the feet (baseline).  Behavioral/Psych: Mood is stable, no new changes  All other systems were reviewed  with the patient and are negative.   PHYSICAL EXAMINATION:  ECOG PERFORMANCE STATUS: 1 BP 124/82 (BP Location: Right Arm, Patient Position: Sitting)   Pulse 67   Temp 98 F (36.7 C) (Oral)   Resp 20   Ht '5\' 8"'$  (1.727 m)   Wt 124 lb 1.6 oz (56.3 kg)   SpO2 100%   BMI 18.87 kg/m  GENERAL:alert, no distress and comfortable; easily mobile to exam table; kyphosis, moderate.  SKIN: skin color, texture, turgor are normal, no rashes or significant lesions except a small healing, dry wound in the right lower leg above his ankle, with surrounding skin erythema and swelling EYES: normal, Conjunctiva are pink, 1 tear/injection blood vessel left eye in sclera. OROPHARYNX:no exudate, no erythema and lips, buccal mucosa, and tongue normal, no ONJ NECK: supple, thyroid normal size, non-tender, without nodularity; nodules cervical bilaterally.  LYMPH:  no palpable lymphadenopathy in the cervical, axillary or supraclavicular LUNGS: crackles at the bases bilaterally with normal breathing effort, no wheezes or rhonchi HEART: regular rate & rhythm and no murmurs  ABDOMEN:abdomen soft, and normal bowel sounds Musculoskeletal:no cyanosis of digits and no clubbing, NEURO: alert & oriented x 3 with fluent speech, no focal motor/sensory deficits   Labs:  CBC Latest Ref Rng & Units 06/27/2017 06/13/2017 05/23/2017  WBC 3.9 - 10.3 10e3/uL 8.1 7.1 5.5  Hemoglobin 11.6 - 15.9 g/dL 11.2(L) 10.8(L) 10.5(L)  Hematocrit 34.8 - 46.6 % 33.4(L) 32.1(L) 32.1(L)  Platelets 145 - 400 10e3/uL 152 166 138(L)    CMP Latest Ref Rng & Units 06/27/2017 06/13/2017 05/23/2017  Glucose 70 - 140 mg/dl 94 118 141(H)  BUN 7.0 - 26.0 mg/dL 24.1 26.0 20.3  Creatinine 0.6 - 1.1 mg/dL 0.8 0.9 0.8  Sodium 136 - 145 mEq/L 140 140 136  Potassium 3.5 - 5.1 mEq/L 3.9 3.9 4.2  Chloride 101 - 111 mmol/L - - -  CO2 22 - 29 mEq/L 21(L) 20(L) 21(L)  Calcium 8.4 - 10.4 mg/dL 9.6 9.6 9.5  Total Protein 6.4 - 8.3 g/dL 6.6 6.5 5.8(L)  Total  Bilirubin 0.20 - 1.20 mg/dL 0.73 0.68 0.88  Alkaline Phos 40 - 150 U/L 48 49 47  AST 5 - 34 U/L '21 21 19  '$ ALT 0 - 55 U/L '18 21 23    '$ SPEP M-PROTEIN 10/01/2013: 0.18 (nadir)  10/13/2014: 0.6 11/21/2014: 0.5 01/30/2015: 0.6 03/13/2015: 0.6 04/20/2015: 0.5 06/01/2015: 0.5 07/20/2015: 0.7 09/14/2015: 0.7 10/26/2015: 0.8 12/07/2015: 1.0 02/07/2016: 1.1  04/03/2016: 0.2 05/03/2016: 0.2  05/31/2016: 0.1 06/28/2016: 0.1 07/25/2016: 0.1 08/21/2016: 0.1 09/27/2016: 0.1 10/23/16: 0.1 12/27/2016: 0.1 (Dara Specific IFE was still positive) 01/24/17: 0.1 02/28/17: 0.1 03/28/17: 0.1 04/25/17: 0.1 05/23/17: 0.1 06/27/17: PENDING    IgG ([phone]) 10/13/2014: 1710 11/21/2014: 1460 01/30/2015: 1380 03/13/15: 1570 04/20/2015: 1550 06/01/2015: 1590 07/22/2015: 7564 09/14/2015: 1787 10/26/2015: 1898 12/07/2015: 2045 02/07/2016: 1883 04/03/2016: 787 05/03/2016: 685  05/31/2016: 544 06/28/2016: 463 07/25/2016: 455 08/21/2016: 426 09/27/2016: 438 10/23/16: 374 12/06/16: 370 12/27/16: 343, 346 01/24/17: 332  02/28/17: 362 03/28/17:  436 04/25/17: 431 05/23/17: 471 06/27/17: PENDING   Serum kappa, lambda light chains, and ratio  11/21/2014: 3.95, 2.08, 1.90  01/30/2015: 3.92, 2.64, 1.48 03/13/2015: 3.55, 2.17, 1.64 04/20/15: 4.52, 1.78, 2.54 06/01/2015: 4.82, 1.75, 2.75 07/20/2015: 8.71, 2.43, 3.58 09/14/2015: 9.17, 2.02, 4.55 10/26/2015: 11.4, 1.75, 6.42 12/07/2015: 1.54, 1.83, 8.44 02/07/2016: 1.93, 1.42, 13.6 04/03/2016: 0.96, 0.95, 1.01  05/03/2016: 0.71, 0.34, 2.09  05/31/2016: 0.48, 0.26, 1.85 06/28/2016: 0.52, 0.60, 0.87 07/25/2016: 0.46, 0.27, 1.70 08/21/2016: 0.43, 0.27, 1.59 09/27/2016: 0.52, 0.24, 2.17 10/23/16: .0.49, 0.35, 1.40 12/06/16: 0.68, 0.29, 2.34 12/27/16: 0.54, 0.29, 1.86 02/28/17: 0.56, 0.30, 1.87 04/11/17: 0.61, 0.29, 2.10 05/09/17: 0.57, 0.33, 1.73 06/13/17: 0.65, 0.23, 2.83 06/27/17: PENDING    24 hr urine PEP (total protein, M-spike) 10/13/2014: (-) 02/12/2016: '265mg'$ , '68mg'$  04/05/2016: 114 mg,  (-) 08/08/16: 99, (-) 12/27/16: '59mg'$ , (-) 03/28/17: '96mg'$ , (-) 05/23/17: PENDING  PATHOLOGY REPORT  Diagnosis 01/24/2016 Bone Marrow, Aspirate,Biopsy, and Clot, RT iliac and core - VARIABLY CELLULAR BONE MARROW WITH PLASMA CELL NEOPLASM. - SEE COMMENT. PERIPHERAL BLOOD: - MACROCYTIC ANEMIA.  Diagnosis Note The bone marrow is variably cellular with trilineage hematopoiesis and non specific myeloid changes. In this background, the plasma cell component is variable ranging from less than 10 % in many areas to 30% in some aspirate particles with relatively low cellularity. Foci of interstitial infiltrates and small clusters are also seen in the clot section. Immunohistochemical stains show kappa light chain restriction in atypical plasma cell clusters and infiltrates consistent with plasma cell neoplasm. Correlation with cytogenetic and FISH studies is recommended. (BNS:gt, 02/11/2016)  Diagnosis 03/28/16 PLEURAL FLUID, LEFT (SPECIMEN 1 OF 1 COLLECTED 03/28/16): REACTIVE MESOTHELIAL CELLS  RADIOGRAPHIC STUDIES:  ECHO 03/12/17 Study Conclusions - Left ventricle: The cavity size was normal. Wall thickness was   increased in a pattern of mild LVH. Systolic function was normal.   The estimated ejection fraction was in the range of 50% to 55%.   There is hypokinesis of the basalinferior myocardium. Doppler   parameters are consistent with abnormal left ventricular   relaxation (grade 1 diastolic dysfunction). - Aortic valve: There was trivial regurgitation. - Mitral valve: Calcified annulus. Mildly thickened leaflets .   There was trivial regurgitation. - Tricuspid valve: There was mild regurgitation. Impressions: - There has been mild improvement in EF since prior study.    Echo 12/03/16 -Left Ventricle: Systolic function was   mildly to moderately reduced. The estimated ejection fraction was   in the range of 40% to 45%. Diffuse hypokinesis. Doppler  parameters are consistent with abnormal  left ventricular relaxation (grade 1 diastolic dysfunction). Doppler parameters are consistent with indeterminate ventricular filling pressure. - Left atrium: The atrium was severely dilated. - Tricuspid valve: There was trivial regurgitation. - Pulmonary arteries: Systolic pressure was within the normal   range. PA peak pressure: 33 mm Hg (S). - Global longitudinal strain -10.5% (abnormal)  Echo 08/26/2016 - Left ventricle: LVEF is approximately 35% with diffuse hypokinesis with severe hypokinesis/ akinesis of the inferior/inferoseptal walls. In comparison to echo images from December 2017, there does not appear to be significant change The cavity size was normal. Wall thickness was normal. Doppler parameters are consistent with abnormal left ventricular relaxation (grade 1 diastolic dysfunction). - Aortic valve: AV is thickened. 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 Present in echo from December 2017 There was trivial regurgitation. - Right ventricle: Systolic function was mildly reduced. - Right atrium: The atrium was mildly dilated. - Pericardium, extracardiac: A trivial pericardial effusion was identified.  ASSESSMENT: ANGINETTE ESPEJO 76 y.o. female with a history of Multiple Myeloma s/p autotransplant in August 2014 followed by maintenance low dose Revlimid therapy, and relapsed in 01/2016  PLAN:   1. IgG kappa Multiple myeloma s/p Auto SCT, intermediate risk, relapsed in 01/2016 --MM markers demonstrate a very good initial response (M protein baseline 2.6, down to 0.18 after induction chemo and transplant, stable around 0.5 since July 2015, but has gradually increased lately),  she unfortunately relapsed in July 2017 -I previously discussed her repeated bone marrow biopsy from 02/01/2016, which showed 10-30% plasma cells in her marrow -I agreed with Dr. Kendell Bane recommendation to stop Revlimid and change to Daratumumab,  Pomalidomide and dexamethasone due to her disease relapse. I don't think she has resistance to Revlimid, she has been on very small dose of Revlimid  due to her prior history of multiple infections. -She started first cycle DaraPD in 02/2016, but in 2 weeks she developed severe pancytopenia, pneumonia with sepsis required 3 weeks hospital stay. She did have a very nice response to the treatment, her M-protein has significantly reduced from 1.2 to 0.2, light chain levels normalized even with half cycle treatment  -I recommended her to change regiment to DaraVD, and started her on dara every 2 weeks for 2 doses, she tolerated this very well -she has had good response to treatment, M-protein significantly dropped, will get dara-specific IFE next time to see if she still has M-protein  -Her previous echo in December 2017 showed a significant drop on EF, she is not much symptomatically for CHF, due to the potential contribution of Velcade to her CHF, I'll hold her Velcade for now. Her repeated echo in May 2018 showed a significant improvement, EF 40-45%. -She is clinically doing very well, and has had good response to treatment. We'll continue Dara and dexa '20mg'$  every 4 weeks. -her dara-specific IFE in 12/2016 was still positive, she has low level M-protein, she has achieved a good partial response. -She hasn't had Velcade since December 2017. Will continue holding it for now due to her CHF -We discussed continuing monthly Dara treatment as maintenance therapy.  -She has peripheral neuropathy from her previous treatment. I discussed with her that we could place her on Neurontin; however, she would not like to be placed on anymore medications.  -Hgb was mildly lower, discussed with her to begin a multivitamin. She will consider adding this to her regimen.  -Would like her to f/u w/ her stem cell doctors at [facility] in the near future.  -We discussed her ECHO from 03/12/17 that showed her heart function has normalized  to EF of 50% to 55%.  -She has seen Dr. [name] at [facility] who suggests she use a combination treatment of Dara + low dose Pomalidomide with and increase treatment to every 2 weeks.  -I discussed the available data of Daratumumab as single agent or in combination with Pomalidomide or Velcade. Due to her previous severe cytopenia and infection, and a prolonged excellent response to single agent Dara, I did not recommend her to change to DaraPD again. We discussed that there is no data to support Dara every 2 weeks is better than every 4 weeks maintenance therapy at this point, however it is a logical suggestion, and I am not against Dr. Isabella Bowens recommendation -After a lengthy discussion, we decided to change Dara/dexa  treatment to every 2 weeks on 03/28/17, she has been tolerating well overall  -if she has disease progression, I would consider adding Velcade back -I advised her to drink adequate water, take magnesium, continue calcium and vitamin D to help her muscle cramping. -Labs reviewed, her CBC is overall stable, and her CMP is WNL. Her MM labs is pending for today. Overall adequate to proceed with Dara infusion today  -She will continue Dara/Dexa every 2 weeks  -I will post pone her next infusion to 3 weeks due to holiday.  -F/u in 3 weeks      2. Pancytopenia  -secondary Dara, slightly worse after we increased Dara to every 2 weeks  -Continue monitoring closely. -Blood counts stable except slight drop in Hg to 10.5 on 05/23/17.  -Blood counts have increased as of 06/27/17   3. CHF, CAD, inferior STEMI in 02/2014 -continue metoprolol and lipitor, Dr. Meda Coffee previously started her on lisinopril also -previously followed up  with Dr. Meda Coffee  -her previous echo on 08/26/16 showed improvement with EF 30-35% from 25-30%, further improved to 40-45%, echo in May 2018 -Dr. Meda Coffee plan to repeat her echo every 3 months when she is on treatment. -Her 03/12/17 ECHO shows improved heart function to  normal range of EF at 50% to 55%. -I encouraged her to watch for leg swelling and SBO and chest pain as these are symptoms of her heart condition.   4. Osteoporosis -previous bone density scan showed worsening osteoporosis, continue Zometa, changed to every 3 months after 03/2015.  -she declined repeating DEXA  -she has been on Zometa for 4 years, the risk is probably outweighs the benefit at this point, we have stopped it, last injection was 10/23/16  5. Peripheral neuropathy, G1-2 -Second 2 previous chemotherapy. Slightly worse lately. -We discussed the role of Neurontin, she declined.  6. Goal of care discussion  -We again discussed the incurable nature of her cancer, and the overall poor prognosis, especially if she does not have good response to chemotherapy or progress on chemo -The patient understands the goal of care is palliative. -she is full code now    Plan -Refill Dexamethasone today, she take '20mg'$  every 2 weeks, the day before Dara infusion  -Labs reviewed and adequate for dara infusion today  -Please move all her appointment from 12/28 to 1/4 and add f/u appointment   All questions were answered. The patient knows to call the clinic with any problems, questions or concerns. We can certainly see the patient much sooner if necessary.  I spent 20 minutes counseling the patient face to face. The total time spent in the appointment was 25 minutes.  This document serves as a record of services personally performed by [name], MD. It was created on her behalf by Joslyn Devon, a trained medical scribe. The creation of this record is based on the scribe's personal observations and the provider's statements to them.    I have reviewed the above documentation for accuracy and completeness, and I agree with the above.   [name]  06/27/2017

## 2017-06-27 ENCOUNTER — Ambulatory Visit (HOSPITAL_BASED_OUTPATIENT_CLINIC_OR_DEPARTMENT_OTHER): Payer: Medicare Other

## 2017-06-27 ENCOUNTER — Encounter: Payer: Self-pay | Admitting: Hematology

## 2017-06-27 ENCOUNTER — Other Ambulatory Visit (HOSPITAL_BASED_OUTPATIENT_CLINIC_OR_DEPARTMENT_OTHER): Payer: Medicare Other

## 2017-06-27 ENCOUNTER — Ambulatory Visit (HOSPITAL_BASED_OUTPATIENT_CLINIC_OR_DEPARTMENT_OTHER): Payer: Medicare Other | Admitting: Hematology

## 2017-06-27 VITALS — BP 124/82 | HR 67 | Temp 98.0°F | Resp 20 | Ht 68.0 in | Wt 124.1 lb

## 2017-06-27 VITALS — BP 106/67 | HR 75 | Temp 97.8°F | Resp 16

## 2017-06-27 DIAGNOSIS — D6481 Anemia due to antineoplastic chemotherapy: Secondary | ICD-10-CM

## 2017-06-27 DIAGNOSIS — C9002 Multiple myeloma in relapse: Secondary | ICD-10-CM | POA: Diagnosis not present

## 2017-06-27 DIAGNOSIS — M818 Other osteoporosis without current pathological fracture: Secondary | ICD-10-CM | POA: Diagnosis not present

## 2017-06-27 DIAGNOSIS — I252 Old myocardial infarction: Secondary | ICD-10-CM

## 2017-06-27 DIAGNOSIS — I509 Heart failure, unspecified: Secondary | ICD-10-CM

## 2017-06-27 DIAGNOSIS — C9000 Multiple myeloma not having achieved remission: Secondary | ICD-10-CM

## 2017-06-27 DIAGNOSIS — T451X5A Adverse effect of antineoplastic and immunosuppressive drugs, initial encounter: Secondary | ICD-10-CM

## 2017-06-27 DIAGNOSIS — Z5112 Encounter for antineoplastic immunotherapy: Secondary | ICD-10-CM | POA: Diagnosis not present

## 2017-06-27 LAB — CBC WITH DIFFERENTIAL/PLATELET
BASO%: 0.1 % (ref 0.0–2.0)
Basophils Absolute: 0 10*3/uL (ref 0.0–0.1)
EOS ABS: 0 10*3/uL (ref 0.0–0.5)
EOS%: 0 % (ref 0.0–7.0)
HEMATOCRIT: 33.4 % — AB (ref 34.8–46.6)
HEMOGLOBIN: 11.2 g/dL — AB (ref 11.6–15.9)
LYMPH%: 6.6 % — AB (ref 14.0–49.7)
MCH: 35.9 pg — ABNORMAL HIGH (ref 25.1–34.0)
MCHC: 33.5 g/dL (ref 31.5–36.0)
MCV: 107.1 fL — AB (ref 79.5–101.0)
MONO#: 0.6 10*3/uL (ref 0.1–0.9)
MONO%: 7.6 % (ref 0.0–14.0)
NEUT%: 85.7 % — ABNORMAL HIGH (ref 38.4–76.8)
NEUTROS ABS: 7 10*3/uL — AB (ref 1.5–6.5)
PLATELETS: 152 10*3/uL (ref 145–400)
RBC: 3.12 10*6/uL — AB (ref 3.70–5.45)
RDW: 14.7 % — ABNORMAL HIGH (ref 11.2–14.5)
WBC: 8.1 10*3/uL (ref 3.9–10.3)
lymph#: 0.5 10*3/uL — ABNORMAL LOW (ref 0.9–3.3)

## 2017-06-27 LAB — COMPREHENSIVE METABOLIC PANEL
ALBUMIN: 4.4 g/dL (ref 3.5–5.0)
ALK PHOS: 48 U/L (ref 40–150)
ALT: 18 U/L (ref 0–55)
ANION GAP: 13 meq/L — AB (ref 3–11)
AST: 21 U/L (ref 5–34)
BILIRUBIN TOTAL: 0.73 mg/dL (ref 0.20–1.20)
BUN: 24.1 mg/dL (ref 7.0–26.0)
CALCIUM: 9.6 mg/dL (ref 8.4–10.4)
CO2: 21 mEq/L — ABNORMAL LOW (ref 22–29)
Chloride: 106 mEq/L (ref 98–109)
Creatinine: 0.8 mg/dL (ref 0.6–1.1)
Glucose: 94 mg/dl (ref 70–140)
Potassium: 3.9 mEq/L (ref 3.5–5.1)
Sodium: 140 mEq/L (ref 136–145)
TOTAL PROTEIN: 6.6 g/dL (ref 6.4–8.3)

## 2017-06-27 MED ORDER — ACETAMINOPHEN 325 MG PO TABS
650.0000 mg | ORAL_TABLET | Freq: Once | ORAL | Status: AC
Start: 2017-06-27 — End: 2017-06-27
  Administered 2017-06-27: 650 mg via ORAL

## 2017-06-27 MED ORDER — MONTELUKAST SODIUM 10 MG PO TABS
10.0000 mg | ORAL_TABLET | Freq: Once | ORAL | Status: AC
  Administered 2017-06-27: 10 mg via ORAL

## 2017-06-27 MED ORDER — SODIUM CHLORIDE 0.9 % IV SOLN
Freq: Once | INTRAVENOUS | Status: AC
  Administered 2017-06-27: 12:00:00 via INTRAVENOUS

## 2017-06-27 MED ORDER — DEXAMETHASONE 4 MG PO TABS: ORAL_TABLET | ORAL | 1 refills | Status: DC

## 2017-06-27 MED ORDER — METHYLPREDNISOLONE SODIUM SUCC 125 MG IJ SOLR
INTRAMUSCULAR | Status: AC
  Filled 2017-06-27: qty 2

## 2017-06-27 MED ORDER — SODIUM CHLORIDE 0.9 % IV SOLN
16.0000 mg/kg | Freq: Once | INTRAVENOUS | Status: AC
  Administered 2017-06-27: 900 mg via INTRAVENOUS
  Filled 2017-06-27: qty 40

## 2017-06-27 MED ORDER — MONTELUKAST SODIUM 10 MG PO TABS
ORAL_TABLET | ORAL | Status: AC
  Filled 2017-06-27: qty 1

## 2017-06-27 MED ORDER — DIPHENHYDRAMINE HCL 25 MG PO CAPS
ORAL_CAPSULE | ORAL | Status: AC
  Filled 2017-06-27: qty 2

## 2017-06-27 MED ORDER — ACETAMINOPHEN 325 MG PO TABS
ORAL_TABLET | ORAL | Status: AC
  Filled 2017-06-27: qty 2

## 2017-06-27 MED ORDER — DIPHENHYDRAMINE HCL 25 MG PO CAPS
50.0000 mg | ORAL_CAPSULE | Freq: Once | ORAL | Status: AC
  Administered 2017-06-27: 50 mg via ORAL

## 2017-06-27 MED ORDER — METHYLPREDNISOLONE SODIUM SUCC 125 MG IJ SOLR
125.0000 mg | Freq: Once | INTRAMUSCULAR | Status: AC
  Administered 2017-06-27: 125 mg via INTRAVENOUS

## 2017-06-27 NOTE — Patient Instructions (Signed)
[facility] Discharge Instructions for Patients Receiving Chemotherapy  Today you received the following chemotherapy agents: Darzalex  To help prevent nausea and vomiting after your treatment, we encourage you to take your nausea medication as directed.    If you develop nausea and vomiting that is not controlled by your nausea medication, call the clinic.   BELOW ARE SYMPTOMS THAT SHOULD BE REPORTED IMMEDIATELY:  *FEVER GREATER THAN 100.5 F  *CHILLS WITH OR WITHOUT FEVER  NAUSEA AND VOMITING THAT IS NOT CONTROLLED WITH YOUR NAUSEA MEDICATION  *UNUSUAL SHORTNESS OF BREATH  *UNUSUAL BRUISING OR BLEEDING  TENDERNESS IN MOUTH AND THROAT WITH OR WITHOUT PRESENCE OF ULCERS  *URINARY PROBLEMS  *BOWEL PROBLEMS  UNUSUAL RASH Items with * indicate a potential emergency and should be followed up as soon as possible.  Feel free to call the clinic should you have any questions or concerns. The clinic phone number is [phone].  Please show the CHEMO ALERT CARD at check-in to the Emergency Department and triage nurse.   

## 2017-06-28 ENCOUNTER — Telehealth: Payer: Self-pay | Admitting: Hematology

## 2017-06-28 NOTE — Telephone Encounter (Signed)
Message to [place] re not having room to move 12/28 tx to 1/4.

## 2017-06-30 ENCOUNTER — Telehealth: Payer: Self-pay | Admitting: Hematology

## 2017-06-30 NOTE — Telephone Encounter (Signed)
Per 12/14 los moved lab/inf tio 1/4 and add f/u. No room for infusion 1/4. Per YF ok for lab/fu 1/4 and infusion 1/5 - also ok per nursing.  Spoke with patient husband re changes and new appointments for 1/4 and 1/5. Per husband patient will call back to confirm.

## 2017-07-01 LAB — PROTEIN ELECTROPHORESIS, SERUM
A/G Ratio: 2 — ABNORMAL HIGH (ref 0.7–1.7)
ALBUMIN: 3.9 g/dL (ref 2.9–4.4)
ALPHA 1: 0.2 g/dL (ref 0.0–0.4)
ALPHA 2: 0.6 g/dL (ref 0.4–1.0)
Beta: 0.8 g/dL (ref 0.7–1.3)
GAMMA GLOBULIN: 0.4 g/dL (ref 0.4–1.8)
Globulin, Total: 2 g/dL — ABNORMAL LOW (ref 2.2–3.9)
M-SPIKE, %: 0.1 g/dL — AB
TOTAL PROTEIN: 5.9 g/dL — AB (ref 6.0–8.5)

## 2017-07-02 LAB — IFE, DARA-SPECIFIC, SERUM
IGG (IMMUNOGLOBIN G), SERUM: 429 mg/dL — AB (ref 700–1600)
IgM, Qn, Serum: 15 mg/dL — ABNORMAL LOW (ref 26–217)

## 2017-07-11 ENCOUNTER — Other Ambulatory Visit: Payer: Medicare Other

## 2017-07-11 ENCOUNTER — Ambulatory Visit: Payer: Medicare Other

## 2017-07-17 NOTE — Progress Notes (Signed)
[name] ONCOLOGY OFFICE PROGRESS NOTE  PCP: [name], MD [place] [address]  DIAGNOSIS: Multiple myeloma with relapse  CHIEF COMPLAINTS: Follow-up of multiple myeloma  Oncology History   Multiple myeloma ([name])   Staging form: Multiple Myeloma, AJCC 6th Edition   - Clinical: Stage IIIA - Signed by [name], MD on 09/04/2013      Multiple myeloma ([name])   05/26/2012 Initial Diagnosis    Presenting IgG was 4,040 mg/dL on 06/05/2012 (IgA 35; IgM 34); kappa free light chain 34.4 mg/dL, lambda 0.00, kappa:lambda ratio of 34.75; SPEP with M-spike of 2.60.      06/30/2012 Imaging    Numerous lytic lesions throughout the calvarium.  Lytic lesion within the left lateral scapula.  Findings most compatible with metastases or myeloma. Multiple mid and lower thoracic compression fractures.  Slight compression through the endplates at L3.      85/88/5027 Bone Marrow Biopsy    Bone marrow biopsy showed 49% plasma cell. Normal classical cytogenetics; however, myeloma FISH panel showed 13q- (intermediate risk)          07/14/2012 - 07/27/2012 Radiation Therapy    Palliative radiation 20 Gy over 10 fractions between to thoracic spine cord compression and symptomatic left scapula lytic lesion.      08/10/2012 - 11/2012 Chemotherapy    Started SQ Velcade once weekly, 3 weeks on, 1 weeks off; daily Revlimid d1-21, 7 days off; and Dexamethasone 76m PO weekly.       02/12/2013 Bone Marrow Transplant    [facility]       06/14/2013 Tumor Marker    IgG  1830,  Kappa:lamba ratio, 1.69 (baseline was 34.75 or   M-spike 0.28 (baseline of 2.6 or  89.3% of baseline).        06/25/2013 -  Chemotherapy    Started zometa 3.5 mg monthly       09/01/2013 - 01/2014 Chemotherapy    Maintenance therapy with revlimid 581mdaily for 14 days and then off for 7 (decreased from 21 days on and 7 days off based on neutropenia). Stopped due  to disease progression 01/2017      09/13/2013 - 09/17/2014 Hospital Admission    Hospital admission for pneumonia      12/20/2013 Treatment Plan Change    Maintenance therapy decreased to 2.5 mg daily for 21 days and then off for 7 days based on low counts.       01/25/2014 Tumor Marker    IgG 1360 M spike 0.5      03/01/2014 - 03/05/2014 Hospital Ad[facility] received thromb[facility] therapy, cath showed 60% prox RCA, mid-distal RCA 50%, Echo normal, no stents.      04/11/2014 - 08/07/2014 Chemotherapy    Continue Revlimid at 2.5 mg 3 weeks on 1 week off      08/08/2014 - 08/13/2014 Hospital Admission    Hospital admission for pneumonia. Her Revlimid was held.      08/29/2014 - 09/14/2014 Chemotherapy    Maintenance Revlimid restarted      09/14/2014 - 09/17/2014 Hospital Admission    [place]      11/13/2014 - 12/25/2015 Chemotherapy    Maintenance Revlimid restarted, changed to 2.64m77maily, 2 weks on, 1 week off from 12/15/2014, stopped due to disease progression  01/24/2016 Progression    Patient is M protein has gradually increased to 1.0g, repeated a bone marrow biopsy showed plasma cell 10-30%      02/27/2016 - 03/07/2016 Chemotherapy    Pomalidomide 4 mg daily on day 1-21, dexamethasone 40 mg on day 1, 8 and 15, every 28 days, started on 02/27/2016, daratumumab weekly started on 03/07/2016, held after first dose, due to hospitalization and a severe pancytopenia, pomalidomide was stopped afterwards       03/10/2016 - 03/29/2016 Hospital Admission    Pt was admitted for sepsis from pneumonia, and severe pancytopenia. She required ICU stay for a few days due to hypotension, developed b/l pleural effusion required diuretics and b/l thoracentesis. She also required blood and plt transfusion, and prolonged neupogen injection for severe  neutropenia. She was discharged home after 3 weeks hospital stay       04/26/2016 -  Chemotherapy    Daratumumab restarted on 04/26/2016, Velcade 1.44m/m2 and dexa 211mweekly start on 11/3, velcade held since 06/28/2016 due to CHF       08/26/2016 Echocardiogram    - Left ventricle: LVEF is approximately 35% with diffuse diffuse   hypokinesis with severe hypokinesis/ akinesis of the   inferior/inferoseptal walls. In comparison to echo images from   December 2017, there does not appear to be significant change The   cavity size was normal. Wall thickness was normal. Doppler   parameters are consistent with abnormal left ventricular   relaxation (grade 1 diastolic dysfunction). - Aortic valve: AV is thickened. 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 Present in echo from December 2017 There was   trivial regurgitation. - Right ventricle: Systolic function was mildly reduced. - Right atrium: The atrium was mildly dilated. - Pericardium, extracardiac: A trivial pericardial effusion was   identified.      12/03/2016 Echocardiogram    -Left Ventricle: Systolic function was   mildly to moderately reduced. The estimated ejection fraction was   in the range of 40% to 45%. Diffuse hypokinesis. Doppler   parameters are consistent with abnormal left ventricular   relaxation (grade 1 diastolic dysfunction). Doppler parameters   are consistent with indeterminate ventricular filling pressure. - Left atrium: The atrium was severely dilated. - Tricuspid valve: There was trivial regurgitation. - Pulmonary arteries: Systolic pressure was within the normal   range. PA peak pressure: 33 mm Hg (S). - Global longitudinal strain -10.5% (abnormal)      03/12/2017 Echocardiogram    ECHO 03/12/17 Study Conclusions - Left ventricle: The cavity size was normal. Wall thickness was   increased in a pattern of mild LVH. Systolic function was  normal.   The estimated ejection fraction was in the range of 50% to 55%.   There is hypokinesis of the basalinferior myocardium. Doppler   parameters are consistent with abnormal left ventricular   relaxation (grade 1 diastolic dysfunction). - Aortic valve: There was trivial regurgitation. - Mitral valve: Calcified annulus. Mildly thickened leaflets .   There was trivial regurgitation. - Tricuspid valve: There was mild regurgitation. Impressions: - There has been mild improvement in EF since prior study.         CURRENT TREATMENT:   1. Pomalidomide 4 mg daily on day 1-21, dexamethasone 40 mg on day 1, 8 and 15, every 28 days, started on 02/27/2016, daratumumab weekly started on 03/07/2016, treatment on hold since 03/09/2016 due to hospitalization and severe cytopenia 2. Daratumumab restarted on  04/26/2016, Velcade 1.60m/m2 and dexa 234mweekly start on 11/3, velcade held since 06/28/2016 due to CHF. DaTonette Biharias changed from every 4 weeks to every 2 weeks on 03/28/2017 per Dr. GaKendell Baneecommendation.    INTERVAL HISTORY:  ClJESSAMYN WATTERSON89.o. female with a past medical history of MM as detailed above, s/p SCT is here for follow-up.  She presents to the clinic today noting she gained slight weight. She notes she feels about the same as before. She practices increased circulation to her feet for her neuropathy.   On review of symptoms, she notes she will cough just in the morning and denies leg swelling, or chest pain. She notes peripheral neuropathy of the feet, manageable, that causes reduced sensitivity and discomfort. She also notes slight weight gain.     MEDICAL HISTORY: Past Medical History:  Diagnosis Date  . CAD (coronary artery disease)    [place]  p[place] 60%, mid to dist RCA 50% >>> med rx  . Cord compression ([place]   MRI- diffuse myeloma involvement of T-L spine  . History of radiation  therapy 07/13/12-07/27/12   spinal cord compression T3-T10,left scapula  . Hx of echocardiogram    [facility]  Normal LVEF without significant regional wall motion abnormalities. Mild   . Multiple myeloma ([place]12/18/2013  . OSTEOPOROSIS 06/11/2010   Multiple compression fractures; and spontaneous fracture of sternum Qualifier: Diagnosis of  By: SaZebedee IbaP, SuManuela Schwartz   . Thoracic kyphosis 07/13/12   per MRI scan  . Unspecified deficiency anemia    ALLERGIES:  is allergic to zosyn [piperacillin sod-tazobactam so]; levaquin [levofloxacin in d5w]; other; piperacillin-tazobactam in dex; zithromax [azithromycin]; and ciprofloxacin.  SURGICAL HISTORY:  Past Surgical History:  Procedure Laterality Date  . APPENDECTOMY    . CESAREAN SECTION     x2   . COLONOSCOPY  2007   neg with Dr. MaWatt Climes. ELBOW SURGERY    . HIP SURGERY  2009   left  . TUBAL LIGATION     REVIEW OF SYSTEMS:  Constitutional: Denies abnormal weight loss( +) slight weight gain Eyes: Denies blurriness of vision Ears, nose, mouth, throat, and face: Denies mucositis or sore throat,  Respiratory: Denies dyspnea or wheezes Cardiovascular: Denies palpitation, chest discomfort  Gastrointestinal:  Denies nausea, heartburn or change in bowel habits Skin: Denies abnormal skin rashes  Lymphatics: Denies new lymphadenopathy or easy bruising MSK: (+) muscle cramping in lower legs, hands and diaphragm  Neurological:Denies new weaknesses. Positive for paraesthesias and numbness to the feet (baseline). (+) neuropathy of her feet  Behavioral/Psych: Mood is stable, no new changes  All other systems were reviewed with the patient and are negative.   PHYSICAL EXAMINATION:  ECOG PERFORMANCE STATUS: 1 BP (!) 113/56 (BP Location: Right Arm, Patient Position: Sitting)   Pulse 72   Temp 98.4 F (36.9 C) (Oral)   Resp 20   Ht _0  (1.727 m)   Wt 128 lb 6.4 oz (58.2 kg)   SpO2 98%   BMI  19.52 kg/m    GENERAL:alert, no distress and comfortable; easily mobile to exam table; kyphosis, moderate.  SKIN: skin color, texture, turgor are normal, no rashes or significant lesions except a small healing, dry wound in the right lower leg above his ankle, with surrounding skin erythema and swelling EYES: normal, Conjunctiva are pink, 1 tear/injection blood vessel left  eye in sclera. OROPHARYNX:no exudate, no erythema and lips, buccal mucosa, and tongue normal, no ONJ NECK: supple, thyroid normal size, non-tender, without nodularity; nodules cervical bilaterally.  LYMPH:  no palpable lymphadenopathy in the cervical, axillary or supraclavicular LUNGS: crackles at the bases bilaterally with normal breathing effort, no wheezes or rhonchi HEART: regular rate & rhythm and no murmurs  ABDOMEN:abdomen soft, and normal bowel sounds Musculoskeletal:no cyanosis of digits and no clubbing, NEURO: alert & oriented x 3 with fluent speech, no focal motor/sensory deficits   Labs:  CBC Latest Ref Rng & Units 07/18/2017 06/27/2017 06/13/2017  WBC 3.9 - 10.3 10e3/uL 2.6(L) 8.1 7.1  Hemoglobin 11.6 - 15.9 g/dL 11.2(L) 11.2(L) 10.8(L)  Hematocrit 34.8 - 46.6 % 33.9(L) 33.4(L) 32.1(L)  Platelets 145 - 400 10e3/uL 170 152 166    CMP Latest Ref Rng & Units 07/18/2017 06/27/2017 06/27/2017  Glucose 70 - 140 mg/dl 68(L) 94 -  BUN 7.0 - 26.0 mg/dL 15.1 24.1 -  Creatinine 0.6 - 1.1 mg/dL 0.8 0.8 -  Sodium 136 - 145 mEq/L 140 140 -  Potassium 3.5 - 5.1 mEq/L 3.9 3.9 -  Chloride 101 - 111 mmol/L - - -  CO2 22 - 29 mEq/L 27 21(L) -  Calcium 8.4 - 10.4 mg/dL 9.0 9.6 -  Total Protein 6.4 - 8.3 g/dL 5.8(L) 6.6 5.9(L)  Total Bilirubin 0.20 - 1.20 mg/dL 0.50 0.73 -  Alkaline Phos 40 - 150 U/L 51 48 -  AST 5 - 34 U/L 19 21 -  ALT 0 - 55 U/L 17 18 -    SPEP M-PROTEIN 10/01/2013: 0.18 (nadir)  10/13/2014: 0.6 11/21/2014: 0.5 01/30/2015: 0.6 03/13/2015: 0.6 04/20/2015: 0.5 06/01/2015: 0.5 07/20/2015: 0.7 09/14/2015:  0.7 10/26/2015: 0.8 12/07/2015: 1.0 02/07/2016: 1.1  04/03/2016: 0.2 05/03/2016: 0.2  05/31/2016: 0.1 06/28/2016: 0.1 07/25/2016: 0.1 08/21/2016: 0.1 09/27/2016: 0.1 10/23/16: 0.1 12/27/2016: 0.1 (Dara Specific IFE was still positive) 01/24/17: 0.1 02/28/17: 0.1 03/28/17: 0.1 04/25/17: 0.1 05/23/17: 0.1 06/27/17: 0.1   IgG ([phone]) 10/13/2014: 1710 11/21/2014: 1460 01/30/2015: 1380 03/13/15: 1570 04/20/2015: 1550 06/01/2015: 1590 07/22/2015: 1770 09/14/2015: 2119 10/26/2015: 1898 12/07/2015: 2045 02/07/2016: 1883 04/03/2016: 787 05/03/2016: 685  05/31/2016: 544 06/28/2016: 463 07/25/2016: 455 08/21/2016: 426 09/27/2016: 438 10/23/16: 374 12/06/16: 370 12/27/16: 343, 346 01/24/17: 332  02/28/17: 362 03/28/17:  436 04/25/17: 431 05/23/17: 471 06/27/17: 429  Serum kappa, lambda light chains, and ratio  11/21/2014: 3.95, 2.08, 1.90  01/30/2015: 3.92, 2.64, 1.48 03/13/2015: 3.55, 2.17, 1.64 04/20/15: 4.52, 1.78, 2.54 06/01/2015: 4.82, 1.75, 2.75 07/20/2015: 8.71, 2.43, 3.58 09/14/2015: 9.17, 2.02, 4.55 10/26/2015: 11.4, 1.75, 6.42 12/07/2015: 1.54, 1.83, 8.44 02/07/2016: 1.93, 1.42, 13.6 04/03/2016: 0.96, 0.95, 1.01  05/03/2016: 0.71, 0.34, 2.09  05/31/2016: 0.48, 0.26, 1.85 06/28/2016: 0.52, 0.60, 0.87 07/25/2016: 0.46, 0.27, 1.70 08/21/2016: 0.43, 0.27, 1.59 09/27/2016: 0.52, 0.24, 2.17 10/23/16: .0.49, 0.35, 1.40 12/06/16: 0.68, 0.29, 2.34 12/27/16: 0.54, 0.29, 1.86 02/28/17: 0.56, 0.30, 1.87 04/11/17: 0.61, 0.29, 2.10 05/09/17: 0.57, 0.33, 1.73 06/13/17: 0.65, 0.23, 2.83 07/18/16: PENDING    24 hr urine PEP (total protein, M-spike) 10/13/2014: (-) 02/12/2016: 265m, 657m9/22/2017: 114 mg, (-) 08/08/16: 99, (-) 12/27/16: 5946m(-) 03/28/17: 78m45m-)   PATHOLOGY REPORT  Diagnosis 01/24/2016 Bone Marrow, Aspirate,Biopsy, and Clot, RT iliac and core - VARIABLY CELLULAR BONE MARROW WITH PLASMA CELL NEOPLASM. - SEE COMMENT. PERIPHERAL BLOOD: - MACROCYTIC ANEMIA.  Diagnosis Note The bone  marrow is variably cellular with trilineage hematopoiesis and non specific myeloid changes. In this background, the plasma cell component is variable ranging  from less than 10 % in many areas to 30% in some aspirate particles with relatively low cellularity. Foci of interstitial infiltrates and small clusters are also seen in the clot section. Immunohistochemical stains show kappa light chain restriction in atypical plasma cell clusters and infiltrates consistent with plasma cell neoplasm. Correlation with cytogenetic and FISH studies is recommended. (BNS:gt, 2016/02/20)  Diagnosis 03/28/16 PLEURAL FLUID, LEFT (SPECIMEN 1 OF 1 COLLECTED 03/28/16): REACTIVE MESOTHELIAL CELLS  RADIOGRAPHIC STUDIES:  ECHO 03/12/17 Study Conclusions - Left ventricle: The cavity size was normal. Wall thickness was   increased in a pattern of mild LVH. Systolic function was normal.   The estimated ejection fraction was in the range of 50% to 55%.   There is hypokinesis of the basalinferior myocardium. Doppler   parameters are consistent with abnormal left ventricular   relaxation (grade 1 diastolic dysfunction). - Aortic valve: There was trivial regurgitation. - Mitral valve: Calcified annulus. Mildly thickened leaflets .   There was trivial regurgitation. - Tricuspid valve: There was mild regurgitation. Impressions: - There has been mild improvement in EF since prior study.    Echo 12/03/16 -Left Ventricle: Systolic function was   mildly to moderately reduced. The estimated ejection fraction was   in the range of 40% to 45%. Diffuse hypokinesis. Doppler  parameters are consistent with abnormal left ventricular relaxation (grade 1 diastolic dysfunction). Doppler parameters are consistent with indeterminate ventricular filling pressure. - Left atrium: The atrium was severely dilated. - Tricuspid valve: There was trivial regurgitation. - Pulmonary arteries: Systolic pressure was within the normal   range. PA peak  pressure: 33 mm Hg (S). - Global longitudinal strain -10.5% (abnormal)  Echo 08/26/2016 - Left ventricle: LVEF is approximately 35% with diffuse hypokinesis with severe hypokinesis/ akinesis of the inferior/inferoseptal walls. In comparison to echo images from December 2017, there does not appear to be significant change The cavity size was normal. Wall thickness was normal. Doppler parameters are consistent with abnormal left ventricular relaxation (grade 1 diastolic dysfunction). - Aortic valve: AV is thickened. 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 Present in echo from December 2017 There was trivial regurgitation. - Right ventricle: Systolic function was mildly reduced. - Right atrium: The atrium was mildly dilated. - Pericardium, extracardiac: A trivial pericardial effusion was identified.  ASSESSMENT: Shannon Rush 77 y.o. female with a history of Multiple Myeloma s/p autotransplant in August 2014 followed by maintenance low dose Revlimid therapy, and relapsed in 01/2016  PLAN:   1. IgG kappa Multiple myeloma s/p Auto SCT, intermediate risk, relapsed in 01/2016 --MM markers demonstrate a very good initial response (M protein baseline 2.6, down to 0.18 after induction chemo and transplant, stable around 0.5 since July 2015, but has gradually increased lately),  she unfortunately relapsed in July 2017 -I previously discussed her repeated bone marrow biopsy from 02/01/2016, which showed 10-30% plasma cells in her marrow -I agreed with Dr. Kendell Bane recommendation to stop Revlimid and change to Daratumumab, Pomalidomide and dexamethasone due to her disease relapse. I don't think she has resistance to Revlimid, she has been on very small dose of Revlimid due to her prior history of multiple infections. -She started first cycle DaraPD in 02/2016, but in 2 weeks she developed severe pancytopenia, pneumonia with sepsis required 3 weeks  hospital stay. She did have a very nice response to the treatment, her M-protein has significantly reduced from 1.2 to 0.2, light chain levels normalized even with half cycle  treatment  -I recommended her to change regiment to DaraVD, and started her on dara every 2 weeks for 2 doses, she tolerated this very well -she has had good response to treatment, M-protein significantly dropped, will get dara-specific IFE next time to see if she still has M-protein  -Her previous echo in December 2017 showed a significant drop on EF, she is not much symptomatically for CHF, due to the potential contribution of Velcade to her CHF, I'll hold her Velcade for now. Her repeated echo in May 2018 showed a significant improvement, EF 40-45%. -She is clinically doing very well, and has had good response to treatment. We'll continue Dara and dexa 42m every 4 weeks. -her dara-specific IFE in 12/2016 was still positive, she has low level M-protein, she has achieved a good partial response. -She hasn't had Velcade since December 2017. Will continue holding it for now due to her CHF -She has peripheral neuropathy from her previous treatment. I discussed with her that we could place her on Neurontin; however, she would not like to be placed on anymore medications.  -We discussed her ECHO from 03/12/17 that showed her heart function has normalized to EF of 50% to 55%.  -[facility] she use a combination treatment of Dara + low dose Pomalidomide with and increase treatment to every 2 weeks.  -I discussed the available data of Daratumumab as single agent or in combination with Pomalidomide or Velcade. Due to her previous severe cytopenia and infection, and a prolonged excellent response to single agent Dara, I did not recommend her to change to DaraPD again. We discussed that there is no data to support Dara every 2 weeks is better than every 4 weeks maintenance therapy at this point, however it is  a logical suggestion, and I am not against Dr. GIsabella Bowensrecommendation -After a lengthy discussion, we decided to change Dara/dexa treatment to every 2 weeks on 03/28/17, she has been tolerating well overall  -if she has disease progression, I would consider adding Velcade back -I advised her to drink adequate water, take magnesium, continue calcium and vitamin D to help her muscle cramping -CBC reviewed, CBC and MM panel overall stable, WBC at 2.6. Overall adequate to proceed with Dara infusion tomorrow  -She will continue Dara/Dexa every 2 weeks  -F/u in 4 weeks      2. Pancytopenia  -secondary Dara, slightly worse after we increased Dara to every 2 weeks  -Continue monitoring closely. -She has mild anemia, overall stable -Blood counts stable except a slight drop in WBC to 2.6 (07/18/17)  3. CHF, CAD, inferior STEMI in 02/2014 -continue metoprolol and lipitor, Dr. NMeda Coffeepreviously started her on lisinopril also -previously followed up with Dr. NMeda Coffee -her previous echo on 08/26/16 showed improvement with EF 30-35% from 25-30%, further improved to 40-45%, echo in May 2018 -Dr. NMeda Coffeeplan to repeat her echo every 3 months when she is on treatment. -Her 03/12/17 ECHO shows improved heart function to normal range of EF at 50% to 55%. -I encouraged her to watch for leg swelling and SBO and chest pain as these are symptoms of her heart condition.   4. Osteoporosis -previous bone density scan showed worsening osteoporosis, continue Zometa, changed to every 3 months after 03/2015.  -she declined repeating DEXA  -she has been on Zometa for 4 years, the risk is probably outweighs the benefit at this point, we have stopped it, last injection was 10/23/16  5. Peripheral  neuropathy in feet, G1-2 -Secondary to previous chemotherapy. Slightly worse lately. -We discussed the role of Neurontin, she declined. -I suggest Vitamin B complex and continue practice of circulation of her feet. I advised her to  be careful to not fall.  -Will monitor, she will let us know if this worsens.    6. Goal of care discussion  -We again discussed the incurable nature of her cancer, and the overall poor prognosis, especially if she does not have good response to chemotherapy or progress on chemo -The patient understands the goal of care is palliative. -she is full code now    Plan -Lab, flush and dara in 2, 4 and 6 week -she will take dexa 28m the day before Dara infusion  -F/u in 4 weeks    All questions were answered. The patient knows to call the clinic with any problems, questions or concerns. We can certainly see the patient much sooner if necessary.  I spent 20 minutes counseling the patient face to face. The total time spent in the appointment was 25 minutes.  This document serves as a record of services personally performed by YTruitt Merle MD. It was created on her behalf by AJoslyn Devon a trained medical scribe. The creation of this record is based on the scribe's personal observations and the provider's statements to them.    I have reviewed the above documentation for accuracy and completeness, and I agree with the above.     YTruitt Merle 07/18/2017

## 2017-07-18 ENCOUNTER — Other Ambulatory Visit (HOSPITAL_BASED_OUTPATIENT_CLINIC_OR_DEPARTMENT_OTHER): Payer: Medicare Other

## 2017-07-18 ENCOUNTER — Telehealth: Payer: Self-pay | Admitting: Hematology

## 2017-07-18 ENCOUNTER — Encounter: Payer: Self-pay | Admitting: Hematology

## 2017-07-18 ENCOUNTER — Ambulatory Visit (HOSPITAL_BASED_OUTPATIENT_CLINIC_OR_DEPARTMENT_OTHER): Payer: Medicare Other | Admitting: Hematology

## 2017-07-18 VITALS — BP 113/56 | HR 72 | Temp 98.4°F | Resp 20 | Ht 68.0 in | Wt 128.4 lb

## 2017-07-18 DIAGNOSIS — I509 Heart failure, unspecified: Secondary | ICD-10-CM | POA: Diagnosis not present

## 2017-07-18 DIAGNOSIS — I252 Old myocardial infarction: Secondary | ICD-10-CM

## 2017-07-18 DIAGNOSIS — G620 Drug-induced polyneuropathy: Secondary | ICD-10-CM

## 2017-07-18 DIAGNOSIS — M810 Age-related osteoporosis without current pathological fracture: Secondary | ICD-10-CM | POA: Diagnosis not present

## 2017-07-18 DIAGNOSIS — D6481 Anemia due to antineoplastic chemotherapy: Secondary | ICD-10-CM

## 2017-07-18 DIAGNOSIS — C9002 Multiple myeloma in relapse: Secondary | ICD-10-CM | POA: Diagnosis not present

## 2017-07-18 DIAGNOSIS — T451X5A Adverse effect of antineoplastic and immunosuppressive drugs, initial encounter: Secondary | ICD-10-CM

## 2017-07-18 DIAGNOSIS — D61818 Other pancytopenia: Secondary | ICD-10-CM

## 2017-07-18 DIAGNOSIS — C9000 Multiple myeloma not having achieved remission: Secondary | ICD-10-CM | POA: Diagnosis not present

## 2017-07-18 DIAGNOSIS — M818 Other osteoporosis without current pathological fracture: Secondary | ICD-10-CM

## 2017-07-18 LAB — COMPREHENSIVE METABOLIC PANEL
ALBUMIN: 3.7 g/dL (ref 3.5–5.0)
ALK PHOS: 51 U/L (ref 40–150)
ALT: 17 U/L (ref 0–55)
ANION GAP: 7 meq/L (ref 3–11)
AST: 19 U/L (ref 5–34)
BUN: 15.1 mg/dL (ref 7.0–26.0)
CO2: 27 mEq/L (ref 22–29)
CREATININE: 0.8 mg/dL (ref 0.6–1.1)
Calcium: 9 mg/dL (ref 8.4–10.4)
Chloride: 106 mEq/L (ref 98–109)
EGFR: >60 mL/min/{1.73_m2} (ref >60)
GLUCOSE: 68 mg/dL — AB (ref 70–140)
Potassium: 3.9 mEq/L (ref 3.5–5.1)
Sodium: 140 mEq/L (ref 136–145)
TOTAL PROTEIN: 5.8 g/dL — AB (ref 6.4–8.3)
Total Bilirubin: 0.5 mg/dL (ref 0.20–1.20)

## 2017-07-18 LAB — CBC WITH DIFFERENTIAL/PLATELET
BASO%: 0.8 % (ref 0.0–2.0)
BASOS ABS: 0 10*3/uL (ref 0.0–0.1)
EOS ABS: 0.1 10*3/uL (ref 0.0–0.5)
EOS%: 2.3 % (ref 0.0–7.0)
HEMATOCRIT: 33.9 % — AB (ref 34.8–46.6)
HEMOGLOBIN: 11.2 g/dL — AB (ref 11.6–15.9)
LYMPH%: 10.9 % — ABNORMAL LOW (ref 14.0–49.7)
MCH: 35.8 pg — AB (ref 25.1–34.0)
MCHC: 33 g/dL (ref 31.5–36.0)
MCV: 108.3 fL — AB (ref 79.5–101.0)
MONO#: 0.3 10*3/uL (ref 0.1–0.9)
MONO%: 10.5 % (ref 0.0–14.0)
NEUT%: 75.5 % (ref 38.4–76.8)
NEUTROS ABS: 1.9 10*3/uL (ref 1.5–6.5)
Platelets: 170 10*3/uL (ref 145–400)
RBC: 3.13 10*6/uL — ABNORMAL LOW (ref 3.70–5.45)
RDW: 14.8 % — AB (ref 11.2–14.5)
WBC: 2.6 10*3/uL — AB (ref 3.9–10.3)
lymph#: 0.3 10*3/uL — ABNORMAL LOW (ref 0.9–3.3)

## 2017-07-18 NOTE — Telephone Encounter (Signed)
Patient declined avs and calendar  °

## 2017-07-19 ENCOUNTER — Ambulatory Visit (HOSPITAL_BASED_OUTPATIENT_CLINIC_OR_DEPARTMENT_OTHER): Payer: Medicare Other

## 2017-07-19 VITALS — BP 102/57 | HR 79 | Temp 98.2°F | Resp 18

## 2017-07-19 DIAGNOSIS — Z5112 Encounter for antineoplastic immunotherapy: Secondary | ICD-10-CM | POA: Diagnosis not present

## 2017-07-19 DIAGNOSIS — C9000 Multiple myeloma not having achieved remission: Secondary | ICD-10-CM

## 2017-07-19 DIAGNOSIS — C9002 Multiple myeloma in relapse: Secondary | ICD-10-CM

## 2017-07-19 MED ORDER — DIPHENHYDRAMINE HCL 25 MG PO CAPS
ORAL_CAPSULE | ORAL | Status: AC
  Filled 2017-07-19: qty 2

## 2017-07-19 MED ORDER — DIPHENHYDRAMINE HCL 25 MG PO CAPS
50.0000 mg | ORAL_CAPSULE | Freq: Once | ORAL | Status: AC
  Administered 2017-07-19: 50 mg via ORAL

## 2017-07-19 MED ORDER — MONTELUKAST SODIUM 10 MG PO TABS
10.0000 mg | ORAL_TABLET | Freq: Once | ORAL | Status: AC
  Administered 2017-07-19: 10 mg via ORAL

## 2017-07-19 MED ORDER — SODIUM CHLORIDE 0.9 % IV SOLN
Freq: Once | INTRAVENOUS | Status: AC
  Administered 2017-07-19: 09:00:00 via INTRAVENOUS

## 2017-07-19 MED ORDER — METHYLPREDNISOLONE SODIUM SUCC 125 MG IJ SOLR
INTRAMUSCULAR | Status: AC
  Filled 2017-07-19: qty 2

## 2017-07-19 MED ORDER — METHYLPREDNISOLONE SODIUM SUCC 125 MG IJ SOLR
125.0000 mg | Freq: Once | INTRAMUSCULAR | Status: AC
  Administered 2017-07-19: 125 mg via INTRAVENOUS

## 2017-07-19 MED ORDER — ACETAMINOPHEN 325 MG PO TABS
ORAL_TABLET | ORAL | Status: AC
  Filled 2017-07-19: qty 2

## 2017-07-19 MED ORDER — MONTELUKAST SODIUM 10 MG PO TABS
ORAL_TABLET | ORAL | Status: AC
  Filled 2017-07-19: qty 1

## 2017-07-19 MED ORDER — SODIUM CHLORIDE 0.9 % IV SOLN
16.0000 mg/kg | Freq: Once | INTRAVENOUS | Status: AC
  Administered 2017-07-19: 900 mg via INTRAVENOUS
  Filled 2017-07-19: qty 40

## 2017-07-19 MED ORDER — ACETAMINOPHEN 325 MG PO TABS
650.0000 mg | ORAL_TABLET | Freq: Once | ORAL | Status: AC
  Administered 2017-07-19: 650 mg via ORAL

## 2017-07-19 NOTE — Patient Instructions (Signed)
[facility] Discharge Instructions for Patients Receiving Chemotherapy  Today you received the following chemotherapy agents: Darzalex  To help prevent nausea and vomiting after your treatment, we encourage you to take your nausea medication as directed.    If you develop nausea and vomiting that is not controlled by your nausea medication, call the clinic.   BELOW ARE SYMPTOMS THAT SHOULD BE REPORTED IMMEDIATELY:  *FEVER GREATER THAN 100.5 F  *CHILLS WITH OR WITHOUT FEVER  NAUSEA AND VOMITING THAT IS NOT CONTROLLED WITH YOUR NAUSEA MEDICATION  *UNUSUAL SHORTNESS OF BREATH  *UNUSUAL BRUISING OR BLEEDING  TENDERNESS IN MOUTH AND THROAT WITH OR WITHOUT PRESENCE OF ULCERS  *URINARY PROBLEMS  *BOWEL PROBLEMS  UNUSUAL RASH Items with * indicate a potential emergency and should be followed up as soon as possible.  Feel free to call the clinic should you have any questions or concerns. The clinic phone number is [phone].  Please show the CHEMO ALERT CARD at check-in to the Emergency Department and triage nurse.   

## 2017-07-21 LAB — KAPPA/LAMBDA LIGHT CHAINS
IG KAPPA FREE LIGHT CHAIN: 7.5 mg/L (ref 3.3–19.4)
Ig Lambda Free Light Chain: 2.6 mg/L — ABNORMAL LOW (ref 5.7–26.3)
Kappa/Lambda FluidC Ratio: 2.88 — ABNORMAL HIGH (ref 0.26–1.65)

## 2017-07-25 IMAGING — DX DG CHEST 1V PORT
1 series · 1 of 1 positions shown · non-contrast
Comparison: Multiple priors, most recently 03/17/2016

CLINICAL DATA: Acute respiratory failure, multiple myeloma

EXAM:
PORTABLE CHEST 1 VIEW

[chest ap]
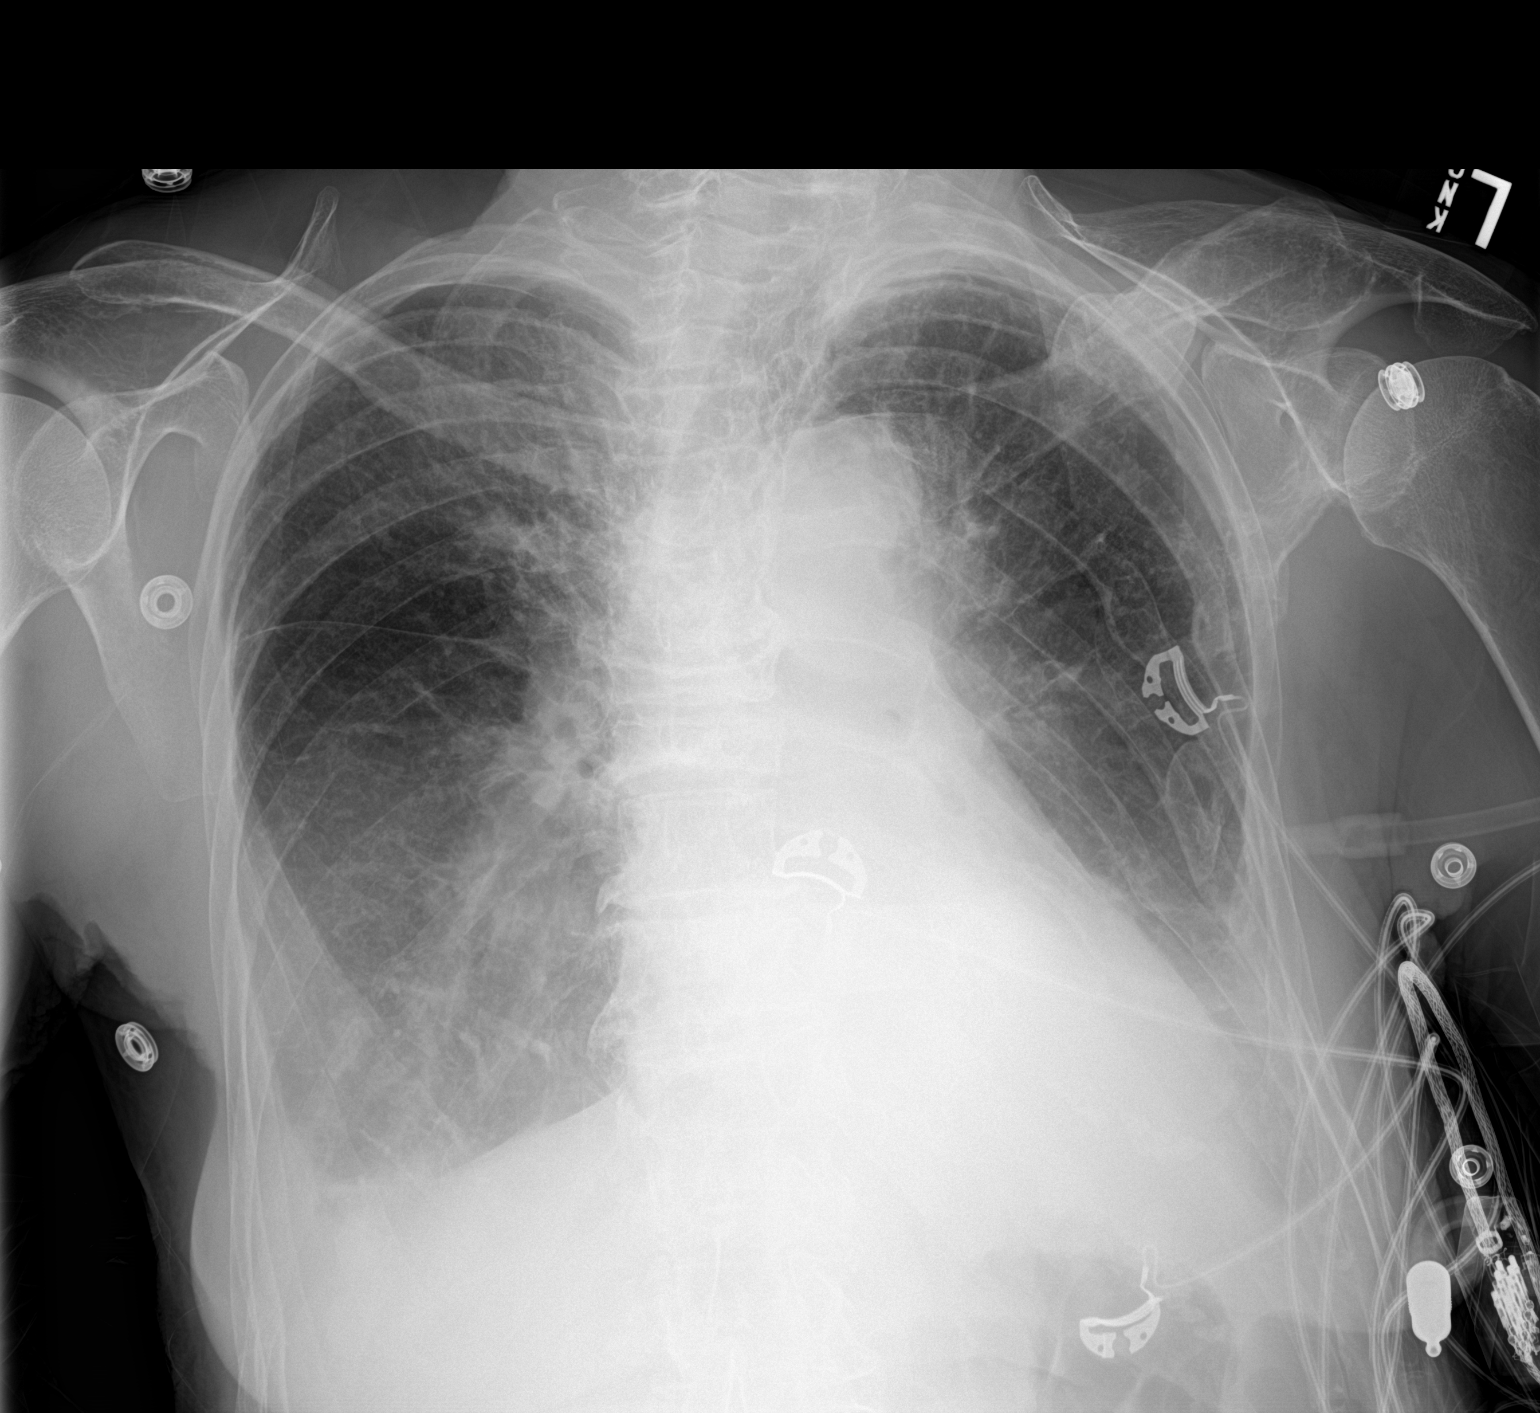

[1 of 1 positions shown; findings below may reference images not displayed]

FINDINGS: Upper lobe predominant scarring, chronic.

Possible mild perihilar edema. Small bilateral pleural effusions,
mildly increased. Associated patchy right lower lobe opacity,
atelectasis versus pneumonia. Suspected left lower lobe opacity,
although obscured by overlying soft tissues.

Cardiomegaly.

Degenerative changes of the thoracic spine.
IMPRESSION: Cardiomegaly with possible mild perihilar edema and increasing small
bilateral pleural effusions.

Associated patchy right lower lobe opacity, atelectasis versus
pneumonia.

## 2017-07-31 ENCOUNTER — Other Ambulatory Visit: Payer: Self-pay | Admitting: *Deleted

## 2017-07-31 DIAGNOSIS — C9001 Multiple myeloma in remission: Secondary | ICD-10-CM

## 2017-08-01 ENCOUNTER — Inpatient Hospital Stay: Payer: Medicare Other | Attending: Hematology

## 2017-08-01 ENCOUNTER — Inpatient Hospital Stay: Payer: Medicare Other

## 2017-08-01 VITALS — BP 83/51 | HR 64 | Temp 98.5°F | Resp 18

## 2017-08-01 DIAGNOSIS — C9000 Multiple myeloma not having achieved remission: Secondary | ICD-10-CM

## 2017-08-01 DIAGNOSIS — Z5112 Encounter for antineoplastic immunotherapy: Secondary | ICD-10-CM | POA: Insufficient documentation

## 2017-08-01 DIAGNOSIS — C9001 Multiple myeloma in remission: Secondary | ICD-10-CM

## 2017-08-01 DIAGNOSIS — C9002 Multiple myeloma in relapse: Secondary | ICD-10-CM

## 2017-08-01 LAB — COMPREHENSIVE METABOLIC PANEL
ALK PHOS: 50 U/L (ref 40–150)
ALT: 18 U/L (ref 0–55)
AST: 19 U/L (ref 5–34)
Albumin: 3.9 g/dL (ref 3.5–5.0)
Anion gap: 12 — ABNORMAL HIGH (ref 3–11)
BILIRUBIN TOTAL: 0.8 mg/dL (ref 0.2–1.2)
BUN: 22 mg/dL (ref 7–26)
CALCIUM: 9.4 mg/dL (ref 8.4–10.4)
CO2: 20 mmol/L — ABNORMAL LOW (ref 22–29)
Chloride: 105 mmol/L (ref 98–109)
Creatinine, Ser: 0.89 mg/dL (ref 0.60–1.10)
GLUCOSE: 138 mg/dL (ref 70–140)
Potassium: 4.1 mmol/L (ref 3.3–4.7)
Sodium: 137 mmol/L (ref 136–145)
TOTAL PROTEIN: 6.1 g/dL — AB (ref 6.4–8.3)

## 2017-08-01 LAB — CBC WITH DIFFERENTIAL (CANCER CENTER ONLY)
Basophils Absolute: 0 10*3/uL (ref 0.0–0.1)
Basophils Relative: 0 %
Eosinophils Absolute: 0 10*3/uL (ref 0.0–0.5)
Eosinophils Relative: 0 %
HEMATOCRIT: 32.4 % — AB (ref 34.8–46.6)
HEMOGLOBIN: 11 g/dL — AB (ref 11.6–15.9)
LYMPHS ABS: 0.1 10*3/uL — AB (ref 0.9–3.3)
Lymphocytes Relative: 1 %
MCH: 36.2 pg — AB (ref 25.1–34.0)
MCHC: 34 g/dL (ref 31.5–36.0)
MCV: 106.4 fL — AB (ref 79.5–101.0)
MONOS PCT: 2 %
Monocytes Absolute: 0.2 10*3/uL (ref 0.1–0.9)
NEUTROS ABS: 10.1 10*3/uL — AB (ref 1.5–6.5)
NEUTROS PCT: 97 %
Platelet Count: 165 10*3/uL (ref 145–400)
RBC: 3.04 MIL/uL — ABNORMAL LOW (ref 3.70–5.45)
RDW: 14.5 % (ref 11.2–16.1)
WBC Count: 10.5 10*3/uL — ABNORMAL HIGH (ref 3.9–10.3)

## 2017-08-01 MED ORDER — MONTELUKAST SODIUM 10 MG PO TABS
10.0000 mg | ORAL_TABLET | Freq: Once | ORAL | Status: AC
  Administered 2017-08-01: 10 mg via ORAL

## 2017-08-01 MED ORDER — METHYLPREDNISOLONE SODIUM SUCC 125 MG IJ SOLR
125.0000 mg | Freq: Once | INTRAMUSCULAR | Status: AC
  Administered 2017-08-01: 125 mg via INTRAVENOUS

## 2017-08-01 MED ORDER — SODIUM CHLORIDE 0.9 % IV SOLN
16.0000 mg/kg | Freq: Once | INTRAVENOUS | Status: AC
  Administered 2017-08-01: 900 mg via INTRAVENOUS
  Filled 2017-08-01: qty 5

## 2017-08-01 MED ORDER — HEPARIN SOD (PORK) LOCK FLUSH 100 UNIT/ML IV SOLN
500.0000 [IU] | Freq: Once | INTRAVENOUS | Status: DC | PRN
  Filled 2017-08-01: qty 5

## 2017-08-01 MED ORDER — SODIUM CHLORIDE 0.9% FLUSH
10.0000 mL | INTRAVENOUS | Status: DC | PRN
  Filled 2017-08-01: qty 10

## 2017-08-01 MED ORDER — MONTELUKAST SODIUM 10 MG PO TABS
ORAL_TABLET | ORAL | Status: AC
  Filled 2017-08-01: qty 1

## 2017-08-01 MED ORDER — DIPHENHYDRAMINE HCL 25 MG PO CAPS
ORAL_CAPSULE | ORAL | Status: AC
  Filled 2017-08-01: qty 2

## 2017-08-01 MED ORDER — ACETAMINOPHEN 325 MG PO TABS
650.0000 mg | ORAL_TABLET | Freq: Once | ORAL | Status: AC
  Administered 2017-08-01: 650 mg via ORAL

## 2017-08-01 MED ORDER — DIPHENHYDRAMINE HCL 25 MG PO CAPS
50.0000 mg | ORAL_CAPSULE | Freq: Once | ORAL | Status: AC
  Administered 2017-08-01: 50 mg via ORAL

## 2017-08-01 MED ORDER — ACETAMINOPHEN 325 MG PO TABS
ORAL_TABLET | ORAL | Status: AC
  Filled 2017-08-01: qty 2

## 2017-08-01 MED ORDER — SODIUM CHLORIDE 0.9 % IV SOLN
Freq: Once | INTRAVENOUS | Status: AC
  Administered 2017-08-01: 14:00:00 via INTRAVENOUS

## 2017-08-01 MED ORDER — METHYLPREDNISOLONE SODIUM SUCC 125 MG IJ SOLR
INTRAMUSCULAR | Status: AC
  Filled 2017-08-01: qty 2

## 2017-08-01 NOTE — Patient Instructions (Signed)
[facility] Discharge Instructions for Patients Receiving Chemotherapy  Today you received the following chemotherapy agents: Darzalex  To help prevent nausea and vomiting after your treatment, we encourage you to take your nausea medication as directed.    If you develop nausea and vomiting that is not controlled by your nausea medication, call the clinic.   BELOW ARE SYMPTOMS THAT SHOULD BE REPORTED IMMEDIATELY:  *FEVER GREATER THAN 100.5 F  *CHILLS WITH OR WITHOUT FEVER  NAUSEA AND VOMITING THAT IS NOT CONTROLLED WITH YOUR NAUSEA MEDICATION  *UNUSUAL SHORTNESS OF BREATH  *UNUSUAL BRUISING OR BLEEDING  TENDERNESS IN MOUTH AND THROAT WITH OR WITHOUT PRESENCE OF ULCERS  *URINARY PROBLEMS  *BOWEL PROBLEMS  UNUSUAL RASH Items with * indicate a potential emergency and should be followed up as soon as possible.  Feel free to call the clinic should you have any questions or concerns. The clinic phone number is [phone].  Please show the CHEMO ALERT CARD at check-in to the Emergency Department and triage nurse.   

## 2017-08-03 IMAGING — DX DG CHEST 2V
2 series · 2 of 2 positions shown · non-contrast
Comparison: 03/26/2016

CLINICAL DATA: History of multiple myeloma, on chemotherapy.
Pleural effusion.

EXAM:
CHEST  2 VIEW

[chest pa]
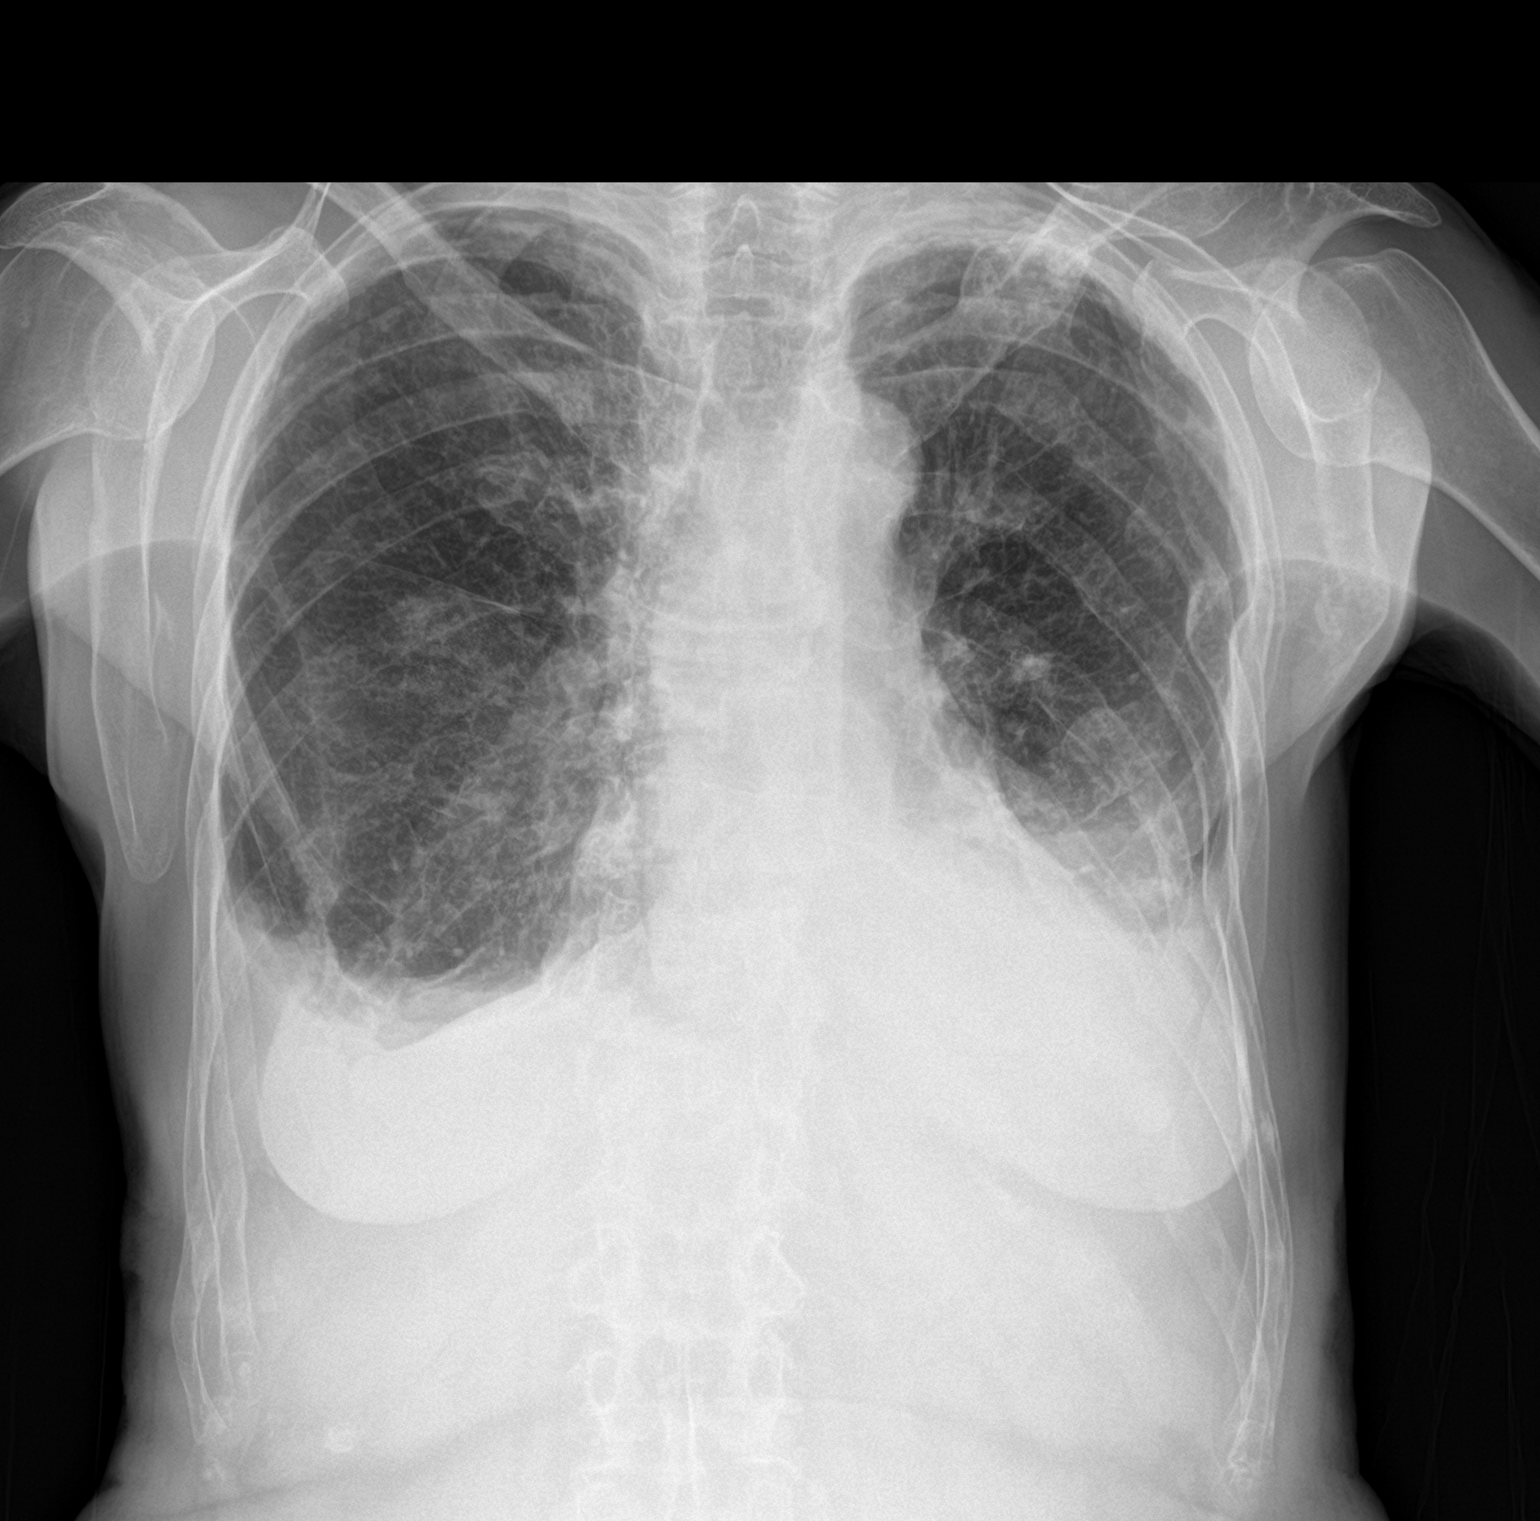

[chest lat]
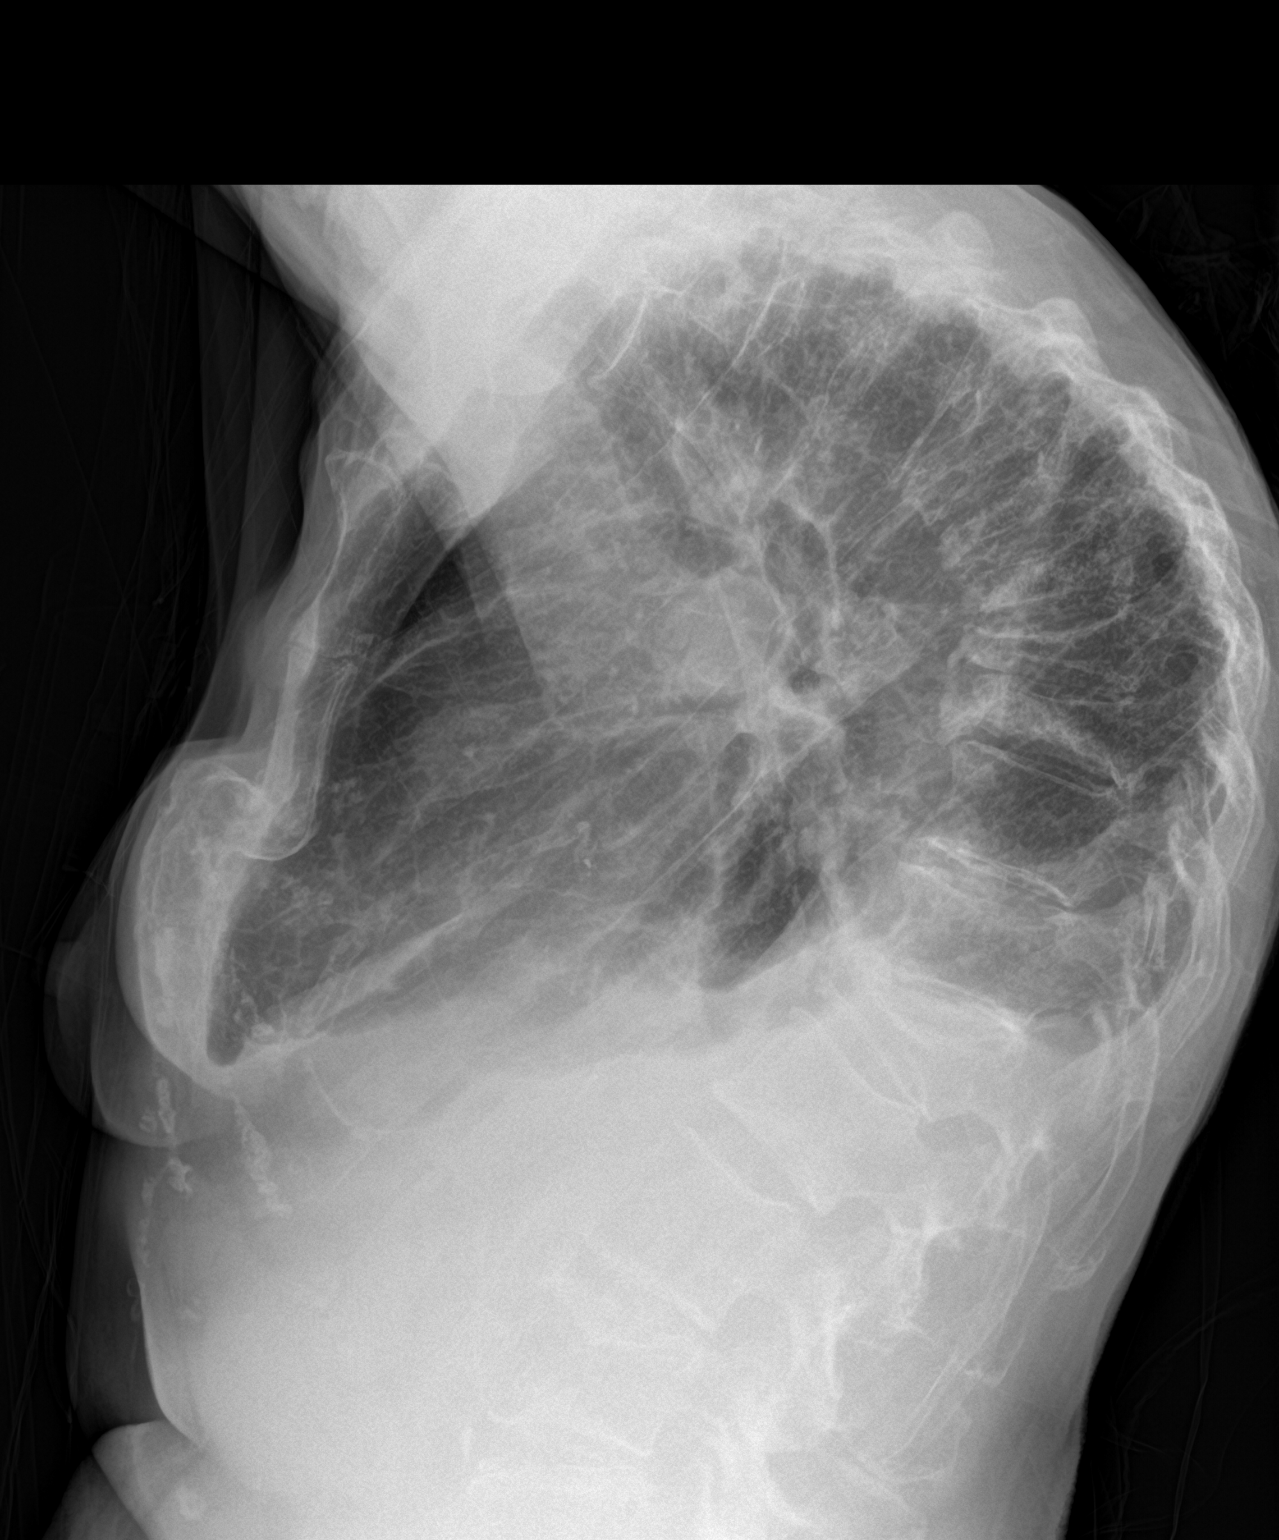

[2 of 2 positions shown; findings below may reference images not displayed]

FINDINGS: There are moderate bilateral pleural effusions. Bibasilar
atelectasis or infiltrates, left worse than right. Effusions have
increased slightly since prior study. Heart is borderline in size.
Underlying COPD. Biapical scarring. Multiple old left rib fractures
and multiple severe compression fractures throughout the thoracic
spine.
IMPRESSION: Moderate bilateral pleural effusions, increasing since prior study.
Bibasilar atelectasis or infiltrates, left greater than right.

COPD.

Multiple old left rib fractures and multiple severe thoracic spine
compression fractures.

## 2017-08-04 IMAGING — DX DG CHEST 1V PORT
1 series · 1 of 1 positions shown · non-contrast
Comparison: Chest x-ray of 03/27/2016

CLINICAL DATA: Left thoracentesis

EXAM:
PORTABLE CHEST 1 VIEW

[chest ap]
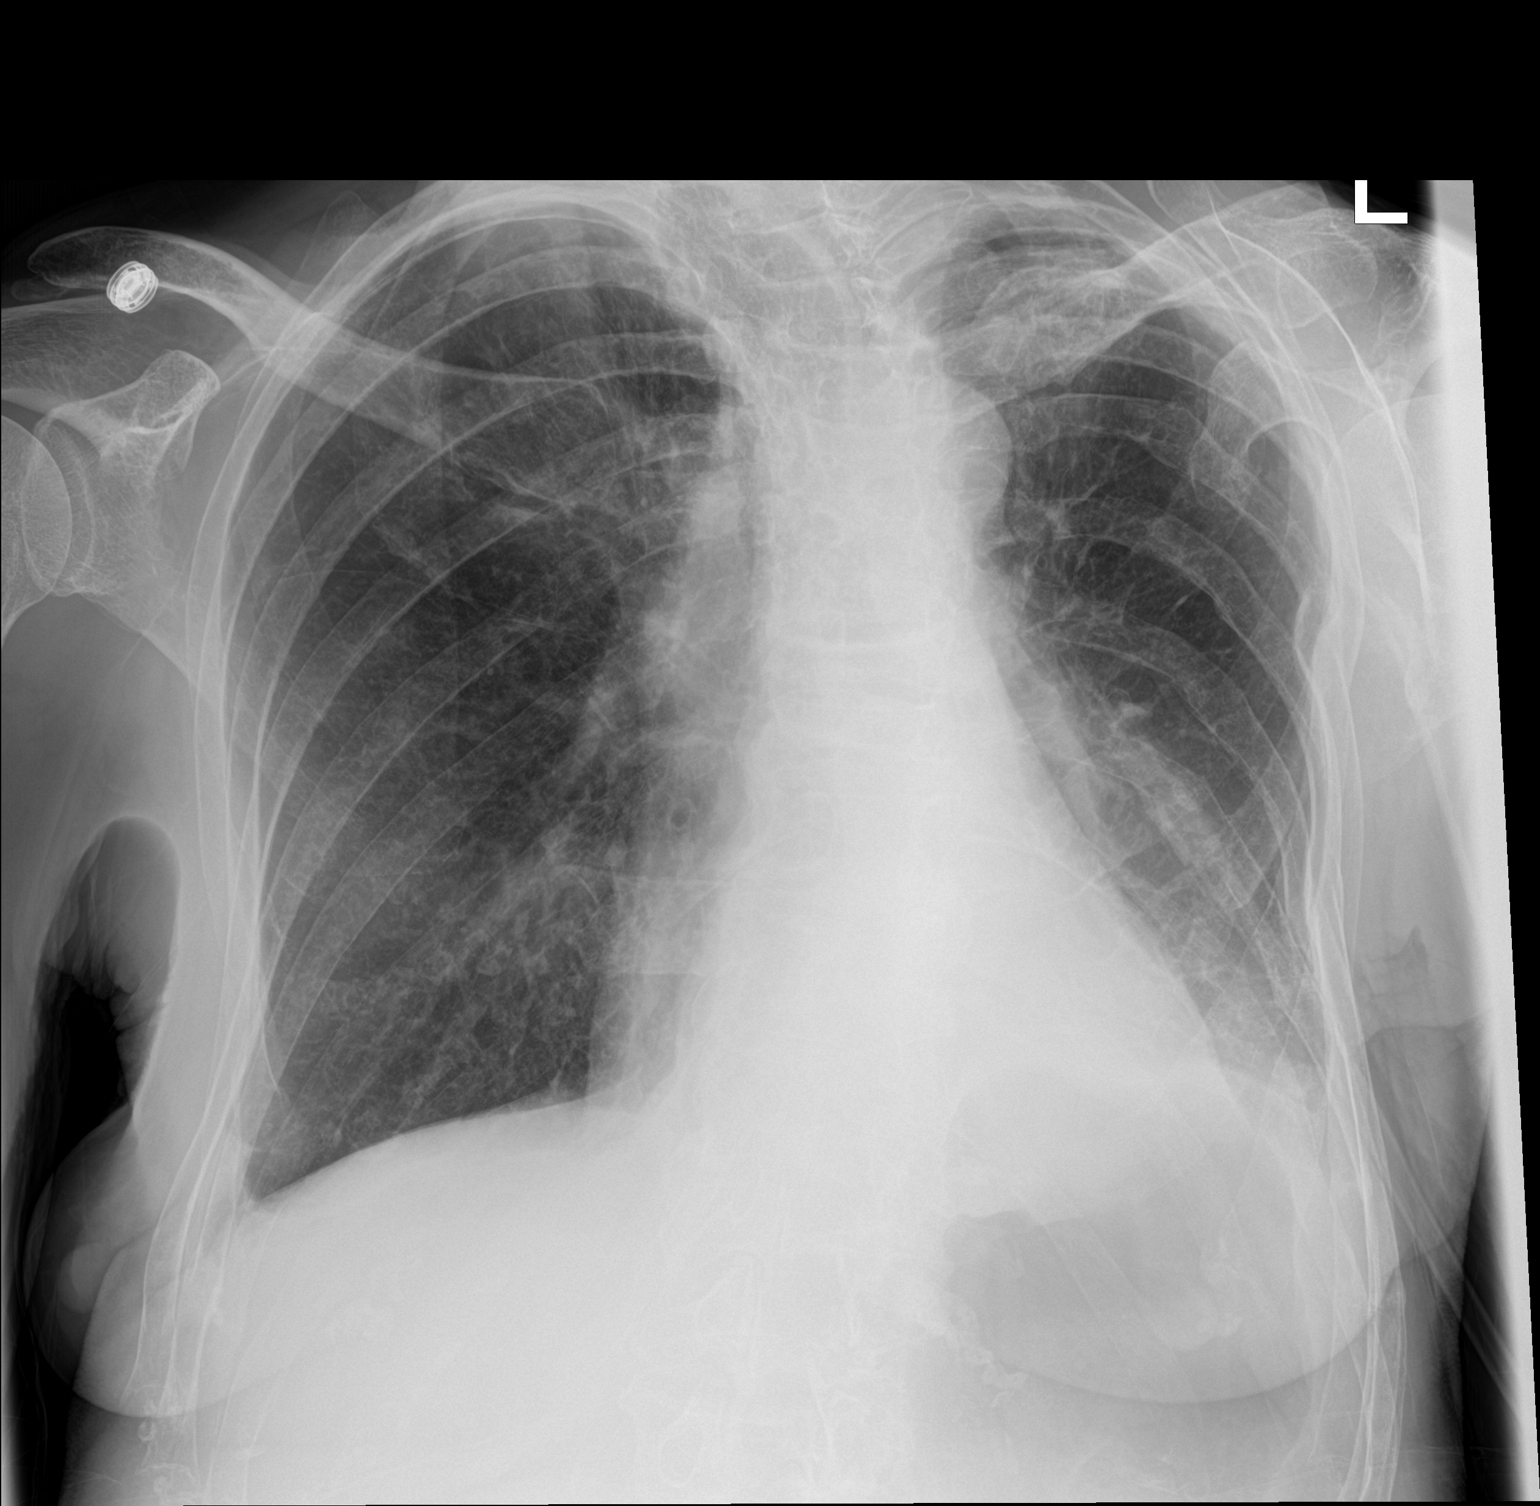

[1 of 1 positions shown; findings below may reference images not displayed]

FINDINGS: Some of the left pleural effusion has been evacuated after left
thoracentesis. No pneumothorax is seen. There may be a tiny right
pleural effusion present. Mild cardiomegaly is stable. Multiple old
healed left rib fractures are noted with an old healed fracture of
the left proximal clavicle. However, there does appear to be an
acute fracture of the anterior right eighth rib on the frontal view.
IMPRESSION: 1. Decrease in left pleural effusion after left thoracentesis. No
pneumothorax.
2. Fracture of the anterior right eighth rib appears acute. Old left
rib fractures are noted.
3. Cannot exclude a small right pleural effusion.

## 2017-08-05 LAB — PROTEIN ELECTROPHORESIS, SERUM
A/G Ratio: 2.2 — ABNORMAL HIGH (ref 0.7–1.7)
ALPHA-1-GLOBULIN: 0.2 g/dL (ref 0.0–0.4)
Albumin ELP: 4 g/dL (ref 2.9–4.4)
Alpha-2-Globulin: 0.5 g/dL (ref 0.4–1.0)
Beta Globulin: 0.8 g/dL (ref 0.7–1.3)
GLOBULIN, TOTAL: 1.8 g/dL — AB (ref 2.2–3.9)
Gamma Globulin: 0.3 g/dL — ABNORMAL LOW (ref 0.4–1.8)
M-SPIKE, %: 0.1 g/dL — AB
TOTAL PROTEIN ELP: 5.8 g/dL — AB (ref 6.0–8.5)

## 2017-08-05 LAB — IFE, DARA-SPECIFIC, SERUM
IGG (IMMUNOGLOBIN G), SERUM: 392 mg/dL — AB (ref 700–1600)
IgA: <5 mg/dL — ABNORMAL LOW (ref 64–422)
IgM (Immunoglobulin M), Srm: 15 mg/dL — ABNORMAL LOW (ref 26–217)

## 2017-08-06 ENCOUNTER — Other Ambulatory Visit: Payer: Self-pay | Admitting: *Deleted

## 2017-08-06 DIAGNOSIS — I252 Old myocardial infarction: Secondary | ICD-10-CM

## 2017-08-06 DIAGNOSIS — Z79899 Other long term (current) drug therapy: Secondary | ICD-10-CM

## 2017-08-06 DIAGNOSIS — I2510 Atherosclerotic heart disease of native coronary artery without angina pectoris: Secondary | ICD-10-CM

## 2017-08-06 MED ORDER — ATORVASTATIN CALCIUM 20 MG PO TABS: 20.0000 mg | ORAL_TABLET | Freq: Every day | ORAL | 3 refills | Status: DC

## 2017-08-13 NOTE — Progress Notes (Signed)
[name] ONCOLOG[facility] OFFICE PROGRESS NOTE  PCP: [name], MD [place] [address]  DIAGNOSIS: Multiple myeloma with relapse  CHIEF COMPLAINTS: Follow-up of multiple myeloma  Oncology History   Multiple myeloma ([name])   Staging form: Multiple Myeloma, AJCC 6th Edition   - Clinical: Stage IIIA - Signed by [name], MD on 09/04/2013      Multiple myeloma ([name])   05/26/2012 Initial Diagnosis    Presenting IgG was 4,040 mg/dL on 06/05/2012 (IgA 35; IgM 34); kappa free light chain 34.4 mg/dL, lambda 0.00, kappa:lambda ratio of 34.75; SPEP with M-spike of 2.60.      06/30/2012 Imaging    Numerous lytic lesions throughout the calvarium.  Lytic lesion within the left lateral scapula.  Findings most compatible with metastases or myeloma. Multiple mid and lower thoracic compression fractures.  Slight compression through the endplates at L3.      85/88/5027 Bone Marrow Biopsy    Bone marrow biopsy showed 49% plasma cell. Normal classical cytogenetics; however, myeloma FISH panel showed 13q- (intermediate risk)          07/14/2012 - 07/27/2012 Radiation Therapy    Palliative radiation 20 Gy over 10 fractions between to thoracic spine cord compression and symptomatic left scapula lytic lesion.      08/10/2012 - 11/2012 Chemotherapy    Started SQ Velcade once weekly, 3 weeks on, 1 weeks off; daily Revlimid d1-21, 7 days off; and Dexamethasone 76m PO weekly.       02/12/2013 Bone Marrow Transplant    [facility]       06/14/2013 Tumor Marker    IgG  1830,  Kappa:lamba ratio, 1.69 (baseline was 34.75 or   M-spike 0.28 (baseline of 2.6 or  89.3% of baseline).        06/25/2013 -  Chemotherapy    Started zometa 3.5 mg monthly       09/01/2013 - 01/2014 Chemotherapy    Maintenance therapy with revlimid 581mdaily for 14 days and then off for 7 (decreased from 21 days on and 7 days off based on neutropenia). Stopped due  to disease progression 01/2017      09/13/2013 - 09/17/2014 Hospital Admission    Hospital admission for pneumonia      12/20/2013 Treatment Plan Change    Maintenance therapy decreased to 2.5 mg daily for 21 days and then off for 7 days based on low counts.       01/25/2014 Tumor Marker    IgG 1360 M spike 0.5      03/01/2014 - 03/05/2014 Hospital Ad[facility] received thrombolytic therapy, cath showed 60% prox RCA, mid-distal RCA 50%, Echo normal, no stents.      04/11/2014 - 08/07/2014 Chemotherapy    Continue Revlimid at 2.5 mg 3 weeks on 1 week off      08/08/2014 - 08/13/2014 Hospital Admission    Hospital admission for pneumonia. Her Revlimid was held.      08/29/2014 - 09/14/2014 Chemotherapy    Maintenance Revlimid restarted      09/14/2014 - 09/17/2014 Hospital Admission    [place]      11/13/2014 - 12/25/2015 Chemotherapy    Maintenance Revlimid restarted, changed to 2.64m77maily, 2 weks on, 1 week off from 12/15/2014, stopped due to disease progression  01/24/2016 Progression    Patient is M protein has gradually increased to 1.0g, repeated a bone marrow biopsy showed plasma cell 10-30%      02/27/2016 - 03/07/2016 Chemotherapy    Pomalidomide 4 mg daily on day 1-21, dexamethasone 40 mg on day 1, 8 and 15, every 28 days, started on 02/27/2016, daratumumab weekly started on 03/07/2016, held after first dose, due to hospitalization and a severe pancytopenia, pomalidomide was stopped afterwards       03/10/2016 - 03/29/2016 Hospital Admission    Pt was admitted for sepsis from pneumonia, and severe pancytopenia. She required ICU stay for a few days due to hypotension, developed b/l pleural effusion required diuretics and b/l thoracentesis. She also required blood and plt transfusion, and prolonged neupogen injection for severe  neutropenia. She was discharged home after 3 weeks hospital stay       04/26/2016 -  Chemotherapy    Daratumumab restarted on 04/26/2016, Velcade 1.44m/m2 and dexa 211mweekly start on 11/3, velcade held since 06/28/2016 due to CHF       08/26/2016 Echocardiogram    - Left ventricle: LVEF is approximately 35% with diffuse diffuse   hypokinesis with severe hypokinesis/ akinesis of the   inferior/inferoseptal walls. In comparison to echo images from   December 2017, there does not appear to be significant change The   cavity size was normal. Wall thickness was normal. Doppler   parameters are consistent with abnormal left ventricular   relaxation (grade 1 diastolic dysfunction). - Aortic valve: AV is thickened. 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 Present in echo from December 2017 There was   trivial regurgitation. - Right ventricle: Systolic function was mildly reduced. - Right atrium: The atrium was mildly dilated. - Pericardium, extracardiac: A trivial pericardial effusion was   identified.      12/03/2016 Echocardiogram    -Left Ventricle: Systolic function was   mildly to moderately reduced. The estimated ejection fraction was   in the range of 40% to 45%. Diffuse hypokinesis. Doppler   parameters are consistent with abnormal left ventricular   relaxation (grade 1 diastolic dysfunction). Doppler parameters   are consistent with indeterminate ventricular filling pressure. - Left atrium: The atrium was severely dilated. - Tricuspid valve: There was trivial regurgitation. - Pulmonary arteries: Systolic pressure was within the normal   range. PA peak pressure: 33 mm Hg (S). - Global longitudinal strain -10.5% (abnormal)      03/12/2017 Echocardiogram    ECHO 03/12/17 Study Conclusions - Left ventricle: The cavity size was normal. Wall thickness was   increased in a pattern of mild LVH. Systolic function was  normal.   The estimated ejection fraction was in the range of 50% to 55%.   There is hypokinesis of the basalinferior myocardium. Doppler   parameters are consistent with abnormal left ventricular   relaxation (grade 1 diastolic dysfunction). - Aortic valve: There was trivial regurgitation. - Mitral valve: Calcified annulus. Mildly thickened leaflets .   There was trivial regurgitation. - Tricuspid valve: There was mild regurgitation. Impressions: - There has been mild improvement in EF since prior study.         CURRENT TREATMENT:   1. Pomalidomide 4 mg daily on day 1-21, dexamethasone 40 mg on day 1, 8 and 15, every 28 days, started on 02/27/2016, daratumumab weekly started on 03/07/2016, treatment on hold since 03/09/2016 due to hospitalization and severe cytopenia 2. Daratumumab restarted on  04/26/2016, Velcade 1.'3mg'$ /m2 and dexa '20mg'$  weekly start on 11/3, velcade held since 06/28/2016 due to CHF. Tonette Bihari was changed from every 4 weeks to every 2 weeks on 03/28/2017 per Dr. Kendell Bane recommendation.    INTERVAL HISTORY:  Shannon Rush 77 y.o. female  here for a follow up. She reports she is doing well overall. She has no complaints with treatment at this time other than insomnia the night before treatment due to steroids and continued neuropathy. She does have a red face also from the steroids. She has some left eyelid swelling onset 3 week ago. She has used some antibiotic cream with no improvement. If it continues to not get better she will see an opthalmologist. She will see her cardiologist next in May.   She reports she is going to [place] [employer] in March and doing a viking cruise in May   On review of systems, pt denies itchiness, leg swelling or any other complaints at this time. Pertinent positives are listed and detailed within the above HPI.   MEDICAL HISTORY: Past Medical History:  Diagnosis Date  . CAD (coronary artery disease)    a. inf STEMI (in [place] >>> [facility]) >>>  LHC (8/15- [place]):  pRCA 60%, mid to dist RCA 50% >>> med rx  . Cord compression ([place]) 07/13/12   MRI- diffuse myeloma involvement of T-L spine  . History of radiation therapy 07/13/12-07/27/12   spinal cord compression T3-T10,left scapula  . Hx of echocardiogram    Echo (03/02/14-done at [facility]):  Normal LVEF without significant regional wall motion abnormalities. Mild   . Multiple myeloma ([place]) 07/01/2012  . OSTEOPOROSIS 06/11/2010   Multiple compression fractures; and spontaneous fracture of sternum Qualifier: Diagnosis of  By: [name], [name]     . Thoracic kyphosis 07/13/12   per MRI scan  . Unspecified deficiency anemia    ALLERGIES:  is allergic to zosyn [piperacillin sod-tazobactam so]; levaquin [levofloxacin in d5w]; other; piperacillin-tazobactam in dex; zithromax [azithromycin]; and ciprofloxacin.  SURGICAL HISTORY:  Past Surgical History:  Procedure Laterality Date  . APPENDECTOMY    . CESAREAN SECTION     x2   . COLONOSCOPY  2007   neg with Dr. [name]  . ELBOW SURGERY    . HIP SURGERY  2009   left  . TUBAL LIGATION     REVIEW OF SYSTEMS:   Constitutional: Denies abnormal weight loss( +) slight weight gain Eyes: Denies blurriness of vision (+) mild swelling over the left eye lid Ears, nose, mouth, throat, and face: Denies mucositis or sore throat,  Respiratory: Denies dyspnea or wheezes Cardiovascular: Denies palpitation, chest discomfort  Gastrointestinal:  Denies nausea, heartburn or change in bowel habits Skin: Denies abnormal skin rashes  Lymphatics: Denies new lymphadenopathy or easy bruising MSK: (+) muscle cramping in lower legs, hands and diaphragm  Neurological:Denies new weaknesses. Positive for paraesthesias and numbness to the feet (baseline). (+) neuropathy of her feet  Behavioral/Psych: Mood is stable, no new changes  All other systems were reviewed with the patient and are negative.   PHYSICAL  EXAMINATION:  ECOG PERFORMANCE STATUS: 1 BP 126/66 (BP Location: Left Arm, Patient Position: Sitting)   Pulse 70   Temp (!) 97.5 F (36.4 C) (Oral)   Resp 17   Ht '5\' 8"'$  (1.727 m)   Wt 126 lb 3.2 oz (57.2 kg)   SpO2 100%   BMI 19.19 kg/m    GENERAL:alert, no distress and comfortable; easily mobile  to exam table; kyphosis, moderate.  SKIN: skin color, texture, turgor are normal, no rashes or significant lesions except a small healing, dry wound in the right lower leg above his ankle, with surrounding skin erythema and swelling EYES: normal, Conjunctiva are pink, 1 tear/injection blood vessel left eye in sclera. OROPHARYNX:no exudate, no erythema and lips, buccal mucosa, and tongue normal, no ONJ NECK: supple, thyroid normal size, non-tender, without nodularity; nodules cervical bilaterally.  LYMPH:  no palpable lymphadenopathy in the cervical, axillary or supraclavicular LUNGS: crackles at the bases bilaterally with normal breathing effort, no wheezes or rhonchi HEART: regular rate & rhythm and no murmurs  ABDOMEN:abdomen soft, and normal bowel sounds Musculoskeletal:no cyanosis of digits and no clubbing, NEURO: alert & oriented x 3 with fluent speech, no focal motor/sensory deficits   Labs:  CBC Latest Ref Rng & Units 08/15/2017 08/01/2017 07/18/2017  WBC 3.9 - 10.3 K/uL 8.9 10.5(H) 2.6(L)  Hemoglobin 11.6 - 15.9 g/dL - - 11.2(L)  Hematocrit 34.8 - 46.6 % 32.4(L) 32.4(L) 33.9(L)  Platelets 145 - 400 K/uL 160 165 170    CMP Latest Ref Rng & Units 08/15/2017 08/01/2017 07/18/2017  Glucose 70 - 140 mg/dL 107 138 68(L)  BUN 7 - 26 mg/dL 22 22 15.1  Creatinine 0.60 - 1.10 mg/dL 0.86 0.89 0.8  Sodium 136 - 145 mmol/L 141 137 140  Potassium 3.5 - 5.1 mmol/L 3.9 4.1 3.9  Chloride 98 - 109 mmol/L 108 105 -  CO2 22 - 29 mmol/L 21(L) 20(L) 27  Calcium 8.4 - 10.4 mg/dL 9.4 9.4 9.0  Total Protein 6.4 - 8.3 g/dL 6.2(L) 6.1(L) 5.8(L)  Total Bilirubin 0.2 - 1.2 mg/dL 0.6 0.8 0.50  Alkaline Phos 40 -  150 U/L 48 50 51  AST 5 - 34 U/L _0 ALT 0 - 55 U/L _1 SPEP M-PROTEIN 10/01/2013: 0.18 (nadir)  10/13/2014: 0.6 11/21/2014: 0.5 01/30/2015: 0.6 03/13/2015: 0.6 04/20/2015: 0.5 06/01/2015: 0.5 07/20/2015: 0.7 09/14/2015: 0.7 10/26/2015: 0.8 12/07/2015: 1.0 02/07/2016: 1.1  04/03/2016: 0.2 05/03/2016: 0.2  05/31/2016: 0.1 06/28/2016: 0.1 07/25/2016: 0.1 08/21/2016: 0.1 09/27/2016: 0.1 10/23/16: 0.1 12/27/2016: 0.1 (Dara Specific IFE was still positive) 01/24/17: 0.1 02/28/17: 0.1 03/28/17: 0.1 04/25/17: 0.1 05/23/17: 0.1 06/27/17: 0.1   IgG [phone]) 10/13/2014: 1710 11/21/2014: 1460 01/30/2015: 1380 03/13/15: 1570 04/20/2015: 1550 06/01/2015: 1590 07/22/2015: 1770 09/14/2015: 6294 10/26/2015: 1898 12/07/2015: 2045 02/07/2016: 1883 04/03/2016: 787 05/03/2016: 685  05/31/2016: 544 06/28/2016: 463 07/25/2016: 455 08/21/2016: 426 09/27/2016: 438 10/23/16: 374 12/06/16: 370 12/27/16: 343, 346 01/24/17: 332  02/28/17: 362 03/28/17:  436 04/25/17: 431 05/23/17: 471 06/27/17: 429  Serum kappa, lambda light chains, and ratio  11/21/2014: 3.95, 2.08, 1.90  01/30/2015: 3.92, 2.64, 1.48 03/13/2015: 3.55, 2.17, 1.64 04/20/15: 4.52, 1.78, 2.54 06/01/2015: 4.82, 1.75, 2.75 07/20/2015: 8.71, 2.43, 3.58 09/14/2015: 9.17, 2.02, 4.55 10/26/2015: 11.4, 1.75, 6.42 12/07/2015: 1.54, 1.83, 8.44 02/07/2016: 1.93, 1.42, 13.6 04/03/2016: 0.96, 0.95, 1.01  05/03/2016: 0.71, 0.34, 2.09  05/31/2016: 0.48, 0.26, 1.85 06/28/2016: 0.52, 0.60, 0.87 07/25/2016: 0.46, 0.27, 1.70 08/21/2016: 0.43, 0.27, 1.59 09/27/2016: 0.52, 0.24, 2.17 10/23/16: .0.49, 0.35, 1.40 12/06/16: 0.68, 0.29, 2.34 12/27/16: 0.54, 0.29, 1.86 02/28/17: 0.56, 0.30, 1.87 04/11/17: 0.61, 0.29, 2.10 05/09/17: 0.57, 0.33, 1.73 06/13/17: 0.65, 0.23, 2.83 07/18/17: 0.75 0.26, 2.83   24 hr urine PEP (total protein, M-spike) 10/13/2014: (-) 02/12/2016: 21m, 670m9/22/2017: 114 mg, (-) 08/08/16: 99, (-) 12/27/16: 591m(-) 03/28/17: 15m25m(-)   PATHOLOGY REPORT  Diagnosis 01/24/2016 Bone Marrow, Aspirate,Biopsy, and  Clot, RT iliac and core - VARIABLY CELLULAR BONE MARROW WITH PLASMA CELL NEOPLASM. - SEE COMMENT. PERIPHERAL BLOOD: - MACROCYTIC ANEMIA.  Diagnosis Note The bone marrow is variably cellular with trilineage hematopoiesis and non specific myeloid changes. In this background, the plasma cell component is variable ranging from less than 10 % in many areas to 30% in some aspirate particles with relatively low cellularity. Foci of interstitial infiltrates and small clusters are also seen in the clot section. Immunohistochemical stains show kappa light chain restriction in atypical plasma cell clusters and infiltrates consistent with plasma cell neoplasm. Correlation with cytogenetic and FISH studies is recommended. (BNS:gt, 31-Jan-2016)  Diagnosis 03/28/16 PLEURAL FLUID, LEFT (SPECIMEN 1 OF 1 COLLECTED 03/28/16): REACTIVE MESOTHELIAL CELLS  RADIOGRAPHIC STUDIES:  ECHO 03/12/17 Study Conclusions - Left ventricle: The cavity size was normal. Wall thickness was   increased in a pattern of mild LVH. Systolic function was normal.   The estimated ejection fraction was in the range of 50% to 55%.   There is hypokinesis of the basalinferior myocardium. Doppler   parameters are consistent with abnormal left ventricular   relaxation (grade 1 diastolic dysfunction). - Aortic valve: There was trivial regurgitation. - Mitral valve: Calcified annulus. Mildly thickened leaflets .   There was trivial regurgitation. - Tricuspid valve: There was mild regurgitation. Impressions: - There has been mild improvement in EF since prior study.    Echo 12/03/16 -Left Ventricle: Systolic function was   mildly to moderately reduced. The estimated ejection fraction was   in the range of 40% to 45%. Diffuse hypokinesis. Doppler  parameters are consistent with abnormal left ventricular relaxation (grade 1 diastolic dysfunction). Doppler  parameters are consistent with indeterminate ventricular filling pressure. - Left atrium: The atrium was severely dilated. - Tricuspid valve: There was trivial regurgitation. - Pulmonary arteries: Systolic pressure was within the normal   range. PA peak pressure: 33 mm Hg (S). - Global longitudinal strain -10.5% (abnormal)  Echo 08/26/2016 - Left ventricle: LVEF is approximately 35% with diffuse hypokinesis with severe hypokinesis/ akinesis of the inferior/inferoseptal walls. In comparison to echo images from December 2017, there does not appear to be significant change The cavity size was normal. Wall thickness was normal. Doppler parameters are consistent with abnormal left ventricular relaxation (grade 1 diastolic dysfunction). - Aortic valve: AV is thickened. 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 Present in echo from December 2017 There was trivial regurgitation. - Right ventricle: Systolic function was mildly reduced. - Right atrium: The atrium was mildly dilated. - Pericardium, extracardiac: A trivial pericardial effusion was identified.  ASSESSMENT: Shannon Rush 77 y.o. female with a history of Multiple Myeloma s/p autotransplant in August 2014 followed by maintenance low dose Revlimid therapy, and relapsed in 01/2016  PLAN:   1. IgG kappa Multiple myeloma s/p Auto SCT, intermediate risk, relapsed in 01/2016 --MM markers demonstrate a very good initial response (M protein baseline 2.6, down to 0.18 after induction chemo and transplant, stable around 0.5 since July 2015, but has gradually increased lately),  she unfortunately relapsed in July 2017 -I previously discussed her repeated bone marrow biopsy from 02/01/2016, which showed 10-30% plasma cells in her marrow -I agreed with Dr. Kendell Bane recommendation to stop Revlimid and change to Daratumumab, Pomalidomide and dexamethasone due to her disease relapse. I don't think  she has resistance to Revlimid, she has been on very small dose of Revlimid due to her prior history of multiple infections. -She  started first cycle DaraPD in 02/2016, but in 2 weeks she developed severe pancytopenia, pneumonia with sepsis required 3 weeks hospital stay. She did have a very nice response to the treatment, her M-protein has significantly reduced from 1.2 to 0.2, light chain levels normalized even with half cycle treatment  -I recommended her to change regiment to DaraVD, and started her on dara every 2 weeks for 2 doses, she tolerated this very well -she has had good response to treatment, M-protein significantly dropped, will get dara-specific IFE next time to see if she still has M-protein  -Her previous echo in December 2017 showed a significant drop on EF, she is not much symptomatically for CHF, due to the potential contribution of Velcade to her CHF, I'll hold her Velcade for now. Her repeated echo in May 2018 showed a significant improvement, EF 40-45%. -She is clinically doing very well, and has had good response to treatment. We'll continue Dara and dexa 15m every 4 weeks. -her dara-specific IFE in 12/2016 was still positive, she has low level M-protein, she has achieved a good partial response. -She hasn't had Velcade since December 2017. Will continue holding it for now due to her CHF -She has peripheral neuropathy from her previous treatment. I discussed with her that we could place her on Neurontin; however, she would not like to be placed on anymore medications.  -We discussed her ECHO from 03/12/17 that showed her heart function has normalized to EF of 50% to 55%.  -[facility] use a combination treatment of Dara + low dose Pomalidomide with and increase treatment to every 2 weeks.  -I discussed the available data of Daratumumab as single agent or in combination with Pomalidomide or Velcade. Due to her previous severe cytopenia and  infection, and a prolonged excellent response to single agent Dara, I did not recommend her to change to DaraPD again. We discussed that there is no data to support Dara every 2 weeks is better than every 4 weeks maintenance therapy at this point, however it is a logical suggestion, and I am not against Dr. GIsabella Bowensrecommendation -After a lengthy discussion, we decided to change Dara/dexa treatment to every 2 weeks on 03/28/17, she has been tolerating well overall. Her M-protein level has not changed (0.1) since then  -if she has disease progression, I would consider adding Velcade back -I advised her to drink adequate water, take magnesium, continue calcium and vitamin D to help her muscle cramping -CBC reviewed, CBC and MM panel overall stable. Overall adequate to proceed with Dara infusion. -She is clinically doing well, no significant side effects from treatment so far, will continue Dara/Dexa every 2 weeks  -F/u in 4 weeks      2. Pancytopenia  -secondary Dara, slightly worse after we increased Dara to every 2 weeks  -Continue monitoring closely. -She has mild anemia, overall stable -Blood counts stable except a slight drop in WBC to 2.6 (07/18/17)  3. CHF, CAD, inferior STEMI in 02/2014 -continue metoprolol and lipitor, Dr. NMeda Coffeepreviously started her on lisinopril also -previously followed up with Dr. NMeda Coffee -her previous echo on 08/26/16 showed improvement with EF 30-35% from 25-30%, further improved to 40-45%, echo in May 2018 -Dr. NMeda Coffeeplan to repeat her echo every 3 months when she is on treatment. -Her 03/12/17 ECHO shows improved heart function to normal range of EF at 50% to 55%. -I continued to encourage her to watch for leg swelling and  SBO and chest pain as these are symptoms of her heart condition.   4. Osteoporosis -previous bone density scan showed worsening osteoporosis, continue Zometa, changed to every 3 months after 03/2015.  -she declined repeating DEXA  -she has  been on Zometa for 4 years, the risk is probably outweighs the benefit at this point, we have stopped it, last injection was 10/23/16  5. Peripheral neuropathy in feet, G1-2 -Secondary to previous chemotherapy. Slightly worse lately. -We discussed the role of Neurontin, she declined. -I suggest Vitamin B complex and continue practice of circulation of her feet. I advised her to be careful to not fall.  -Will monitor, she will let us know if this worsens.    6. Goal of care discussion  -We again discussed the incurable nature of her cancer, and the overall poor prognosis, especially if she does not have good response to chemotherapy or progress on chemo -The patient understands the goal of care is palliative. -she is full code now    Plan -Lab, flush and dara today and every 2 weeks  -she will continue to take dexa 76m the day before Dara infusion  -F/u in 4 weeks    All questions were answered. The patient knows to call the clinic with any problems, questions or concerns. We can certainly see the patient much sooner if necessary.  I spent 20 minutes counseling the patient face to face. The total time spent in the appointment was 25 minutes.  This document serves as a record of services personally performed by YTruitt Merle MD. It was created on her behalf by DTheresia Bough a trained medical scribe. The creation of this record is based on the scribe's personal observations and the provider's statements to them.   I have reviewed the above documentation for accuracy and completeness, and I agree with the above.    YTruitt Merle 08/15/2017 11:35 AM

## 2017-08-15 ENCOUNTER — Inpatient Hospital Stay: Payer: Medicare Other

## 2017-08-15 ENCOUNTER — Inpatient Hospital Stay: Payer: Medicare Other | Attending: Hematology | Admitting: Hematology

## 2017-08-15 VITALS — BP 99/53 | HR 65 | Temp 98.0°F | Resp 18

## 2017-08-15 VITALS — BP 126/66 | HR 70 | Temp 97.5°F | Resp 17 | Ht 68.0 in | Wt 126.2 lb

## 2017-08-15 DIAGNOSIS — G4700 Insomnia, unspecified: Secondary | ICD-10-CM | POA: Diagnosis not present

## 2017-08-15 DIAGNOSIS — M818 Other osteoporosis without current pathological fracture: Secondary | ICD-10-CM

## 2017-08-15 DIAGNOSIS — C9000 Multiple myeloma not having achieved remission: Secondary | ICD-10-CM

## 2017-08-15 DIAGNOSIS — I252 Old myocardial infarction: Secondary | ICD-10-CM | POA: Diagnosis not present

## 2017-08-15 DIAGNOSIS — T451X5S Adverse effect of antineoplastic and immunosuppressive drugs, sequela: Secondary | ICD-10-CM

## 2017-08-15 DIAGNOSIS — D61818 Other pancytopenia: Secondary | ICD-10-CM

## 2017-08-15 DIAGNOSIS — Z9484 Stem cells transplant status: Secondary | ICD-10-CM | POA: Diagnosis not present

## 2017-08-15 DIAGNOSIS — I2510 Atherosclerotic heart disease of native coronary artery without angina pectoris: Secondary | ICD-10-CM

## 2017-08-15 DIAGNOSIS — Z9221 Personal history of antineoplastic chemotherapy: Secondary | ICD-10-CM | POA: Diagnosis not present

## 2017-08-15 DIAGNOSIS — I509 Heart failure, unspecified: Secondary | ICD-10-CM | POA: Diagnosis not present

## 2017-08-15 DIAGNOSIS — Z7952 Long term (current) use of systemic steroids: Secondary | ICD-10-CM | POA: Diagnosis not present

## 2017-08-15 DIAGNOSIS — G620 Drug-induced polyneuropathy: Secondary | ICD-10-CM | POA: Diagnosis not present

## 2017-08-15 DIAGNOSIS — C9002 Multiple myeloma in relapse: Secondary | ICD-10-CM | POA: Diagnosis not present

## 2017-08-15 DIAGNOSIS — Z79899 Other long term (current) drug therapy: Secondary | ICD-10-CM

## 2017-08-15 DIAGNOSIS — Z923 Personal history of irradiation: Secondary | ICD-10-CM | POA: Diagnosis not present

## 2017-08-15 DIAGNOSIS — Z8701 Personal history of pneumonia (recurrent): Secondary | ICD-10-CM

## 2017-08-15 DIAGNOSIS — M810 Age-related osteoporosis without current pathological fracture: Secondary | ICD-10-CM | POA: Diagnosis not present

## 2017-08-15 DIAGNOSIS — D61811 Other drug-induced pancytopenia: Secondary | ICD-10-CM

## 2017-08-15 DIAGNOSIS — Z5112 Encounter for antineoplastic immunotherapy: Secondary | ICD-10-CM | POA: Diagnosis not present

## 2017-08-15 DIAGNOSIS — C9001 Multiple myeloma in remission: Secondary | ICD-10-CM

## 2017-08-15 LAB — CBC WITH DIFFERENTIAL (CANCER CENTER ONLY)
Basophils Absolute: 0 10*3/uL (ref 0.0–0.1)
Basophils Relative: 0 %
EOS ABS: 0 10*3/uL (ref 0.0–0.5)
EOS PCT: 0 %
HCT: 32.4 % — ABNORMAL LOW (ref 34.8–46.6)
HEMOGLOBIN: 10.9 g/dL — AB (ref 11.6–15.9)
LYMPHS ABS: 0.4 10*3/uL — AB (ref 0.9–3.3)
Lymphocytes Relative: 4 %
MCH: 35.7 pg — AB (ref 25.1–34.0)
MCHC: 33.6 g/dL (ref 31.5–36.0)
MCV: 106.2 fL — AB (ref 79.5–101.0)
MONOS PCT: 8 %
Monocytes Absolute: 0.7 10*3/uL (ref 0.1–0.9)
NEUTROS PCT: 88 %
Neutro Abs: 7.8 10*3/uL — ABNORMAL HIGH (ref 1.5–6.5)
Platelet Count: 160 10*3/uL (ref 145–400)
RBC: 3.05 MIL/uL — ABNORMAL LOW (ref 3.70–5.45)
RDW: 14.5 % (ref 11.2–14.5)
WBC Count: 8.9 10*3/uL (ref 3.9–10.3)

## 2017-08-15 LAB — COMPREHENSIVE METABOLIC PANEL
ALK PHOS: 48 U/L (ref 40–150)
ALT: 17 U/L (ref 0–55)
ANION GAP: 12 — AB (ref 3–11)
AST: 18 U/L (ref 5–34)
Albumin: 3.9 g/dL (ref 3.5–5.0)
BUN: 22 mg/dL (ref 7–26)
CALCIUM: 9.4 mg/dL (ref 8.4–10.4)
CO2: 21 mmol/L — AB (ref 22–29)
Chloride: 108 mmol/L (ref 98–109)
Creatinine, Ser: 0.86 mg/dL (ref 0.60–1.10)
Glucose, Bld: 107 mg/dL (ref 70–140)
Potassium: 3.9 mmol/L (ref 3.5–5.1)
SODIUM: 141 mmol/L (ref 136–145)
TOTAL PROTEIN: 6.2 g/dL — AB (ref 6.4–8.3)
Total Bilirubin: 0.6 mg/dL (ref 0.2–1.2)

## 2017-08-15 MED ORDER — SODIUM CHLORIDE 0.9 % IV SOLN
16.0000 mg/kg | Freq: Once | INTRAVENOUS | Status: AC
  Administered 2017-08-15: 900 mg via INTRAVENOUS
  Filled 2017-08-15: qty 40

## 2017-08-15 MED ORDER — METHYLPREDNISOLONE SODIUM SUCC 125 MG IJ SOLR
INTRAMUSCULAR | Status: AC
  Filled 2017-08-15: qty 2

## 2017-08-15 MED ORDER — SODIUM CHLORIDE 0.9 % IV SOLN
Freq: Once | INTRAVENOUS | Status: AC
  Administered 2017-08-15: 12:00:00 via INTRAVENOUS

## 2017-08-15 MED ORDER — MONTELUKAST SODIUM 10 MG PO TABS
ORAL_TABLET | ORAL | Status: AC
  Filled 2017-08-15: qty 1

## 2017-08-15 MED ORDER — DIPHENHYDRAMINE HCL 25 MG PO CAPS
ORAL_CAPSULE | ORAL | Status: AC
  Filled 2017-08-15: qty 2

## 2017-08-15 MED ORDER — ACETAMINOPHEN 325 MG PO TABS
ORAL_TABLET | ORAL | Status: AC
Start: 2017-08-15 — End: 2017-08-15
  Filled 2017-08-15: qty 2

## 2017-08-15 MED ORDER — ACETAMINOPHEN 325 MG PO TABS
650.0000 mg | ORAL_TABLET | Freq: Once | ORAL | Status: AC
  Administered 2017-08-15: 650 mg via ORAL

## 2017-08-15 MED ORDER — METHYLPREDNISOLONE SODIUM SUCC 125 MG IJ SOLR
125.0000 mg | Freq: Once | INTRAMUSCULAR | Status: AC
  Administered 2017-08-15: 125 mg via INTRAVENOUS

## 2017-08-15 MED ORDER — DIPHENHYDRAMINE HCL 25 MG PO CAPS
50.0000 mg | ORAL_CAPSULE | Freq: Once | ORAL | Status: AC
  Administered 2017-08-15: 50 mg via ORAL

## 2017-08-15 MED ORDER — MONTELUKAST SODIUM 10 MG PO TABS
10.0000 mg | ORAL_TABLET | Freq: Once | ORAL | Status: AC
Start: 2017-08-15 — End: 2017-08-15
  Administered 2017-08-15: 10 mg via ORAL

## 2017-08-15 NOTE — Patient Instructions (Signed)
[facility] Discharge Instructions for Patients Receiving Chemotherapy  Today you received the following chemotherapy agents: Darzalex  To help prevent nausea and vomiting after your treatment, we encourage you to take your nausea medication as directed.    If you develop nausea and vomiting that is not controlled by your nausea medication, call the clinic.   BELOW ARE SYMPTOMS THAT SHOULD BE REPORTED IMMEDIATELY:  *FEVER GREATER THAN 100.5 F  *CHILLS WITH OR WITHOUT FEVER  NAUSEA AND VOMITING THAT IS NOT CONTROLLED WITH YOUR NAUSEA MEDICATION  *UNUSUAL SHORTNESS OF BREATH  *UNUSUAL BRUISING OR BLEEDING  TENDERNESS IN MOUTH AND THROAT WITH OR WITHOUT PRESENCE OF ULCERS  *URINARY PROBLEMS  *BOWEL PROBLEMS  UNUSUAL RASH Items with * indicate a potential emergency and should be followed up as soon as possible.  Feel free to call the clinic should you have any questions or concerns. The clinic phone number is [phone].  Please show the CHEMO ALERT CARD at check-in to the Emergency Department and triage nurse.   

## 2017-08-16 ENCOUNTER — Encounter: Payer: Self-pay | Admitting: Hematology

## 2017-08-18 LAB — KAPPA/LAMBDA LIGHT CHAINS
KAPPA, LAMDA LIGHT CHAIN RATIO: 2.89 — AB (ref 0.26–1.65)
Kappa free light chain: 7.8 mg/L (ref 3.3–19.4)
LAMDA FREE LIGHT CHAINS: 2.7 mg/L — AB (ref 5.7–26.3)

## 2017-08-29 ENCOUNTER — Inpatient Hospital Stay: Payer: Medicare Other

## 2017-08-29 VITALS — BP 113/60 | HR 71 | Temp 97.9°F | Resp 18

## 2017-08-29 DIAGNOSIS — D61811 Other drug-induced pancytopenia: Secondary | ICD-10-CM | POA: Diagnosis not present

## 2017-08-29 DIAGNOSIS — C9001 Multiple myeloma in remission: Secondary | ICD-10-CM

## 2017-08-29 DIAGNOSIS — Z9221 Personal history of antineoplastic chemotherapy: Secondary | ICD-10-CM | POA: Diagnosis not present

## 2017-08-29 DIAGNOSIS — T451X5S Adverse effect of antineoplastic and immunosuppressive drugs, sequela: Secondary | ICD-10-CM | POA: Diagnosis not present

## 2017-08-29 DIAGNOSIS — C9000 Multiple myeloma not having achieved remission: Secondary | ICD-10-CM

## 2017-08-29 DIAGNOSIS — Z923 Personal history of irradiation: Secondary | ICD-10-CM | POA: Diagnosis not present

## 2017-08-29 DIAGNOSIS — Z9484 Stem cells transplant status: Secondary | ICD-10-CM | POA: Diagnosis not present

## 2017-08-29 DIAGNOSIS — I509 Heart failure, unspecified: Secondary | ICD-10-CM | POA: Diagnosis not present

## 2017-08-29 DIAGNOSIS — M810 Age-related osteoporosis without current pathological fracture: Secondary | ICD-10-CM | POA: Diagnosis not present

## 2017-08-29 DIAGNOSIS — G620 Drug-induced polyneuropathy: Secondary | ICD-10-CM | POA: Diagnosis not present

## 2017-08-29 DIAGNOSIS — G4700 Insomnia, unspecified: Secondary | ICD-10-CM | POA: Diagnosis not present

## 2017-08-29 DIAGNOSIS — I252 Old myocardial infarction: Secondary | ICD-10-CM

## 2017-08-29 DIAGNOSIS — D61818 Other pancytopenia: Secondary | ICD-10-CM

## 2017-08-29 DIAGNOSIS — Z7952 Long term (current) use of systemic steroids: Secondary | ICD-10-CM | POA: Diagnosis not present

## 2017-08-29 DIAGNOSIS — C9002 Multiple myeloma in relapse: Secondary | ICD-10-CM | POA: Diagnosis not present

## 2017-08-29 DIAGNOSIS — Z79899 Other long term (current) drug therapy: Secondary | ICD-10-CM | POA: Diagnosis not present

## 2017-08-29 DIAGNOSIS — I2510 Atherosclerotic heart disease of native coronary artery without angina pectoris: Secondary | ICD-10-CM | POA: Diagnosis not present

## 2017-08-29 DIAGNOSIS — Z5112 Encounter for antineoplastic immunotherapy: Secondary | ICD-10-CM | POA: Diagnosis not present

## 2017-08-29 LAB — COMPREHENSIVE METABOLIC PANEL
ALBUMIN: 3.9 g/dL (ref 3.5–5.0)
ALK PHOS: 51 U/L (ref 40–150)
ALT: 14 U/L (ref 0–55)
ANION GAP: 12 — AB (ref 3–11)
AST: 18 U/L (ref 5–34)
BILIRUBIN TOTAL: 0.7 mg/dL (ref 0.2–1.2)
BUN: 22 mg/dL (ref 7–26)
CALCIUM: 9.8 mg/dL (ref 8.4–10.4)
CO2: 20 mmol/L — ABNORMAL LOW (ref 22–29)
CREATININE: 0.95 mg/dL (ref 0.60–1.10)
Chloride: 105 mmol/L (ref 98–109)
GFR calc Af Amer: >60 mL/min (ref >60)
GFR calc non Af Amer: 57 mL/min — ABNORMAL LOW (ref >60)
GLUCOSE: 162 mg/dL — AB (ref 70–140)
Potassium: 4.2 mmol/L (ref 3.5–5.1)
Sodium: 137 mmol/L (ref 136–145)
TOTAL PROTEIN: 6.1 g/dL — AB (ref 6.4–8.3)

## 2017-08-29 LAB — CBC WITH DIFFERENTIAL (CANCER CENTER ONLY)
BASOS PCT: 0 %
Basophils Absolute: 0 10*3/uL (ref 0.0–0.1)
Eosinophils Absolute: 0 10*3/uL (ref 0.0–0.5)
Eosinophils Relative: 0 %
HEMATOCRIT: 33.2 % — AB (ref 34.8–46.6)
HEMOGLOBIN: 11.4 g/dL — AB (ref 11.6–15.9)
LYMPHS ABS: 0.2 10*3/uL — AB (ref 0.9–3.3)
Lymphocytes Relative: 3 %
MCH: 36.1 pg — AB (ref 25.1–34.0)
MCHC: 34.4 g/dL (ref 31.5–36.0)
MCV: 105.1 fL — ABNORMAL HIGH (ref 79.5–101.0)
MONOS PCT: 2 %
Monocytes Absolute: 0.1 10*3/uL (ref 0.1–0.9)
NEUTROS ABS: 5.2 10*3/uL (ref 1.5–6.5)
NEUTROS PCT: 95 %
Platelet Count: 166 10*3/uL (ref 145–400)
RBC: 3.16 MIL/uL — AB (ref 3.70–5.45)
RDW: 14.6 % — ABNORMAL HIGH (ref 11.2–14.5)
WBC: 5.5 10*3/uL (ref 3.9–10.3)

## 2017-08-29 MED ORDER — ACETAMINOPHEN 325 MG PO TABS
650.0000 mg | ORAL_TABLET | Freq: Once | ORAL | Status: AC
  Administered 2017-08-29: 650 mg via ORAL

## 2017-08-29 MED ORDER — DARATUMUMAB CHEMO INJECTION 400 MG/20ML
16.0000 mg/kg | Freq: Once | INTRAVENOUS | Status: AC
  Administered 2017-08-29: 900 mg via INTRAVENOUS
  Filled 2017-08-29: qty 40

## 2017-08-29 MED ORDER — METHYLPREDNISOLONE SODIUM SUCC 125 MG IJ SOLR
125.0000 mg | Freq: Once | INTRAMUSCULAR | Status: AC
  Administered 2017-08-29: 125 mg via INTRAVENOUS

## 2017-08-29 MED ORDER — MONTELUKAST SODIUM 10 MG PO TABS
ORAL_TABLET | ORAL | Status: AC
  Filled 2017-08-29: qty 1

## 2017-08-29 MED ORDER — SODIUM CHLORIDE 0.9 % IV SOLN
Freq: Once | INTRAVENOUS | Status: AC
  Administered 2017-08-29: 11:00:00 via INTRAVENOUS

## 2017-08-29 MED ORDER — METHYLPREDNISOLONE SODIUM SUCC 125 MG IJ SOLR
INTRAMUSCULAR | Status: AC
  Filled 2017-08-29: qty 2

## 2017-08-29 MED ORDER — DIPHENHYDRAMINE HCL 25 MG PO CAPS
ORAL_CAPSULE | ORAL | Status: AC
  Filled 2017-08-29: qty 2

## 2017-08-29 MED ORDER — DIPHENHYDRAMINE HCL 25 MG PO CAPS
50.0000 mg | ORAL_CAPSULE | Freq: Once | ORAL | Status: AC
  Administered 2017-08-29: 50 mg via ORAL

## 2017-08-29 MED ORDER — ACETAMINOPHEN 325 MG PO TABS
ORAL_TABLET | ORAL | Status: AC
  Filled 2017-08-29: qty 2

## 2017-08-29 MED ORDER — MONTELUKAST SODIUM 10 MG PO TABS
10.0000 mg | ORAL_TABLET | Freq: Once | ORAL | Status: AC
  Administered 2017-08-29: 10 mg via ORAL

## 2017-08-29 NOTE — Patient Instructions (Signed)
[facility] Discharge Instructions for Patients Receiving Chemotherapy  Today you received the following chemotherapy agents:  Rituxan.  To help prevent nausea and vomiting after your treatment, we encourage you to take your nausea medication as directed.   If you develop nausea and vomiting that is not controlled by your nausea medication, call the clinic.   BELOW ARE SYMPTOMS THAT SHOULD BE REPORTED IMMEDIATELY:  *FEVER GREATER THAN 100.5 F  *CHILLS WITH OR WITHOUT FEVER  NAUSEA AND VOMITING THAT IS NOT CONTROLLED WITH YOUR NAUSEA MEDICATION  *UNUSUAL SHORTNESS OF BREATH  *UNUSUAL BRUISING OR BLEEDING  TENDERNESS IN MOUTH AND THROAT WITH OR WITHOUT PRESENCE OF ULCERS  *URINARY PROBLEMS  *BOWEL PROBLEMS  UNUSUAL RASH Items with * indicate a potential emergency and should be followed up as soon as possible.  Feel free to call the clinic should you have any questions or concerns. The clinic phone number is [phone].  Please show the CHEMO ALERT CARD at check-in to the Emergency Department and triage nurse.   

## 2017-09-01 LAB — PROTEIN ELECTROPHORESIS, SERUM
A/G RATIO SPE: 2.1 — AB (ref 0.7–1.7)
Albumin ELP: 4.1 g/dL (ref 2.9–4.4)
Alpha-1-Globulin: 0.2 g/dL (ref 0.0–0.4)
Alpha-2-Globulin: 0.6 g/dL (ref 0.4–1.0)
Beta Globulin: 0.8 g/dL (ref 0.7–1.3)
GLOBULIN, TOTAL: 2 g/dL — AB (ref 2.2–3.9)
Gamma Globulin: 0.3 g/dL — ABNORMAL LOW (ref 0.4–1.8)
M-Spike, %: 0.1 g/dL — ABNORMAL HIGH
TOTAL PROTEIN ELP: 6.1 g/dL (ref 6.0–8.5)

## 2017-09-01 LAB — KAPPA/LAMBDA LIGHT CHAINS
KAPPA FREE LGHT CHN: 7.9 mg/L (ref 3.3–19.4)
KAPPA, LAMDA LIGHT CHAIN RATIO: 2.63 — AB (ref 0.26–1.65)
LAMDA FREE LIGHT CHAINS: 3 mg/L — AB (ref 5.7–26.3)

## 2017-09-04 LAB — IFE, DARA-SPECIFIC, SERUM
IGG (IMMUNOGLOBIN G), SERUM: 409 mg/dL — AB (ref 700–1600)
IGM (IMMUNOGLOBULIN M), SRM: 15 mg/dL — AB (ref 26–217)

## 2017-09-10 NOTE — Progress Notes (Signed)
[name] ONCOLOGY OFFICE PROGRESS NOTE  PCP: [name], MD [place] [address]  DIAGNOSIS: Multiple myeloma with relapse  CHIEF COMPLAINTS: Follow-up of multiple myeloma  Oncology History   Multiple myeloma ([name])   Staging form: Multiple Myeloma, AJCC 6th Edition   - Clinical: Stage IIIA - Signed by [name], MD on 09/04/2013      Multiple myeloma ([name])   05/26/2012 Initial Diagnosis    Presenting IgG was 4,040 mg/dL on 06/05/2012 (IgA 35; IgM 34); kappa free light chain 34.4 mg/dL, lambda 0.00, kappa:lambda ratio of 34.75; SPEP with M-spike of 2.60.      06/30/2012 Imaging    Numerous lytic lesions throughout the calvarium.  Lytic lesion within the left lateral scapula.  Findings most compatible with metastases or myeloma. Multiple mid and lower thoracic compression fractures.  Slight compression through the endplates at L3.      85/88/5027 Bone Marrow Biopsy    Bone marrow biopsy showed 49% plasma cell. Normal classical cytogenetics; however, myeloma FISH panel showed 13q- (intermediate risk)          07/14/2012 - 07/27/2012 Radiation Therapy    Palliative radiation 20 Gy over 10 fractions between to thoracic spine cord compression and symptomatic left scapula lytic lesion.      08/10/2012 - 11/2012 Chemotherapy    Started SQ Velcade once weekly, 3 weeks on, 1 weeks off; daily Revlimid d1-21, 7 days off; and Dexamethasone 76m PO weekly.       02/12/2013 Bone Marrow Transplant    [facility]       06/14/2013 Tumor Marker    IgG  1830,  Kappa:lamba ratio, 1.69 (baseline was 34.75 or   M-spike 0.28 (baseline of 2.6 or  89.3% of baseline).        06/25/2013 -  Chemotherapy    Started zometa 3.5 mg monthly       09/01/2013 - 01/2014 Chemotherapy    Maintenance therapy with revlimid 581mdaily for 14 days and then off for 7 (decreased from 21 days on and 7 days off based on neutropenia). Stopped due  to disease progression 01/2017      09/13/2013 - 09/17/2014 Hospital Admission    Hospital admission for pneumonia      12/20/2013 Treatment Plan Change    Maintenance therapy decreased to 2.5 mg daily for 21 days and then off for 7 days based on low counts.       01/25/2014 Tumor Marker    IgG 1360 M spike 0.5      03/01/2014 - 03/05/2014 Hospital Ad[facility] received thrombolytic therapy, cath showed 60% prox RCA, mid-distal RCA 50%, Echo normal, no stents.      04/11/2014 - 08/07/2014 Chemotherapy    Continue Revlimid at 2.5 mg 3 weeks on 1 week off      08/08/2014 - 08/13/2014 Hospital Admission    Hospital admission for pneumonia. Her Revlimid was held.      08/29/2014 - 09/14/2014 Chemotherapy    Maintenance Revlimid restarted      09/14/2014 - 09/17/2014 Hospital Admission    [place]      11/13/2014 - 12/25/2015 Chemotherapy    Maintenance Revlimid restarted, changed to 2.64m77maily, 2 weks on, 1 week off from 12/15/2014, stopped due to disease progression  01/24/2016 Progression    Patient is M protein has gradually increased to 1.0g, repeated a bone marrow biopsy showed plasma cell 10-30%      02/27/2016 - 03/07/2016 Chemotherapy    Pomalidomide 4 mg daily on day 1-21, dexamethasone 40 mg on day 1, 8 and 15, every 28 days, started on 02/27/2016, daratumumab weekly started on 03/07/2016, held after first dose, due to hospitalization and a severe pancytopenia, pomalidomide was stopped afterwards       03/10/2016 - 03/29/2016 Hospital Admission    Pt was admitted for sepsis from pneumonia, and severe pancytopenia. She required ICU stay for a few days due to hypotension, developed b/l pleural effusion required diuretics and b/l thoracentesis. She also required blood and plt transfusion, and prolonged neupogen injection for severe  neutropenia. She was discharged home after 3 weeks hospital stay       04/26/2016 -  Chemotherapy    Daratumumab restarted on 04/26/2016, Velcade 1.44m/m2 and dexa 211mweekly start on 11/3, velcade held since 06/28/2016 due to CHF       08/26/2016 Echocardiogram    - Left ventricle: LVEF is approximately 35% with diffuse diffuse   hypokinesis with severe hypokinesis/ akinesis of the   inferior/inferoseptal walls. In comparison to echo images from   December 2017, there does not appear to be significant change The   cavity size was normal. Wall thickness was normal. Doppler   parameters are consistent with abnormal left ventricular   relaxation (grade 1 diastolic dysfunction). - Aortic valve: AV is thickened. 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 Present in echo from December 2017 There was   trivial regurgitation. - Right ventricle: Systolic function was mildly reduced. - Right atrium: The atrium was mildly dilated. - Pericardium, extracardiac: A trivial pericardial effusion was   identified.      12/03/2016 Echocardiogram    -Left Ventricle: Systolic function was   mildly to moderately reduced. The estimated ejection fraction was   in the range of 40% to 45%. Diffuse hypokinesis. Doppler   parameters are consistent with abnormal left ventricular   relaxation (grade 1 diastolic dysfunction). Doppler parameters   are consistent with indeterminate ventricular filling pressure. - Left atrium: The atrium was severely dilated. - Tricuspid valve: There was trivial regurgitation. - Pulmonary arteries: Systolic pressure was within the normal   range. PA peak pressure: 33 mm Hg (S). - Global longitudinal strain -10.5% (abnormal)      03/12/2017 Echocardiogram    ECHO 03/12/17 Study Conclusions - Left ventricle: The cavity size was normal. Wall thickness was   increased in a pattern of mild LVH. Systolic function was  normal.   The estimated ejection fraction was in the range of 50% to 55%.   There is hypokinesis of the basalinferior myocardium. Doppler   parameters are consistent with abnormal left ventricular   relaxation (grade 1 diastolic dysfunction). - Aortic valve: There was trivial regurgitation. - Mitral valve: Calcified annulus. Mildly thickened leaflets .   There was trivial regurgitation. - Tricuspid valve: There was mild regurgitation. Impressions: - There has been mild improvement in EF since prior study.         CURRENT TREATMENT:   1. Pomalidomide 4 mg daily on day 1-21, dexamethasone 40 mg on day 1, 8 and 15, every 28 days, started on 02/27/2016, daratumumab weekly started on 03/07/2016, treatment on hold since 03/09/2016 due to hospitalization and severe cytopenia 2. Daratumumab restarted on  04/26/2016, Velcade 1.'3mg'$ /m2 and dexa '20mg'$  weekly start on 11/3, velcade held since 06/28/2016 due to CHF. Tonette Bihari was changed from every 4 weeks to every 2 weeks on 03/28/2017 per Dr. Kendell Bane recommendation.    INTERVAL HISTORY:  Shannon Rush 77 y.o. female  here for a follow up. She presents to the clinic today by herself. She reports that she saw Dr. [name] yesterday with a positive visit. She notes she is doing well and and states she has continued unchanged neuropathy. She endorses a good appetite and continued exercise.   On review of systems, pt denies new pain, or any other complaints at this time. Pertinent positives are listed and detailed within the above HPI.   MEDICAL HISTORY: Past Medical History:  Diagnosis Date  . CAD (coronary artery disease)    a. inf STEMI (in [place] >>> [facility]) >>> LHC (8/15- [place]):  pRCA 60%, mid to dist RCA 50% >>> med rx  . Cord compression ([place]) 07/13/12   MRI- diffuse myeloma involvement of T-L spine  . History of radiation therapy 07/13/12-07/27/12   spinal cord compression T3-T10,left scapula  . Hx of echocardiogram     Echo (03/02/14-done at [facility]):  Normal LVEF without significant regional wall motion abnormalities. Mild   . Multiple myeloma ([place]) 07/01/2012  . OSTEOPOROSIS 06/11/2010   Multiple compression fractures; and spontaneous fracture of sternum Qualifier: Diagnosis of  By: [name], [name]     . Thoracic kyphosis 07/13/12   per MRI scan  . Unspecified deficiency anemia    ALLERGIES:  is allergic to zosyn [piperacillin sod-tazobactam so]; levaquin [levofloxacin in d5w]; other; piperacillin-tazobactam in dex; zithromax [azithromycin]; and ciprofloxacin.  SURGICAL HISTORY:  Past Surgical History:  Procedure Laterality Date  . APPENDECTOMY    . CESAREAN SECTION     x2   . COLONOSCOPY  2007   neg with Dr. [name]  . ELBOW SURGERY    . HIP SURGERY  2009   left  . TUBAL LIGATION     REVIEW OF SYSTEMS:   Constitutional: Denies abnormal weight loss( +) slight weight gain Eyes: Denies blurriness of vision  Ears, nose, mouth, throat, and face: Denies mucositis or sore throat,  Respiratory: Denies dyspnea or wheezes Cardiovascular: Denies palpitation, chest discomfort  Gastrointestinal:  Denies nausea, heartburn or change in bowel habits Skin: Denies abnormal skin rashes  Lymphatics: Denies new lymphadenopathy or easy bruising MSK: (+) muscle cramping in lower legs, hands and diaphragm  Neurological:Denies new weaknesses. Positive for paraesthesias and numbness to the feet (baseline). (+) neuropathy of her feet  Behavioral/Psych: Mood is stable, no new changes  All other systems were reviewed with the patient and are negative.   PHYSICAL EXAMINATION:  ECOG PERFORMANCE STATUS: 1 BP 127/76 (BP Location: Right Arm, Patient Position: Sitting)   Pulse 71   Temp 97.7 F (36.5 C) (Oral)   Resp 17   Ht '5\' 8"'$  (1.727 m)   Wt 127 lb 8 oz (57.8 kg)   SpO2 100%   BMI 19.39 kg/m    GENERAL:alert, no distress and comfortable; easily mobile to exam table; kyphosis,  moderate.  SKIN: skin color, texture, turgor are normal, no rashes or significant lesions except a small healing, dry wound in the right lower leg above his ankle, with surrounding skin erythema and swelling EYES: normal, Conjunctiva are pink, 1 tear/injection blood vessel left eye in sclera. OROPHARYNX:no exudate, no erythema and lips, buccal mucosa, and tongue normal, no  ONJ NECK: supple, thyroid normal size, non-tender, without nodularity; nodules cervical bilaterally.  LYMPH:  no palpable lymphadenopathy in the cervical, axillary or supraclavicular LUNGS: crackles at the bases bilaterally with normal breathing effort, no wheezes or rhonchi HEART: regular rate & rhythm and no murmurs  ABDOMEN:abdomen soft, and normal bowel sounds Musculoskeletal:no cyanosis of digits and no clubbing, NEURO: alert & oriented x 3 with fluent speech, no focal motor/sensory deficits   Labs:  CBC Latest Ref Rng & Units 09/12/2017 08/29/2017 08/15/2017  WBC 3.9 - 10.3 K/uL 6.4 5.5 8.9  Hemoglobin 11.6 - 15.9 g/dL - - -  Hematocrit 34.8 - 46.6 % 34.4(L) 33.2(L) 32.4(L)  Platelets 145 - 400 K/uL 159 166 160    CMP Latest Ref Rng & Units 09/12/2017 08/29/2017 08/15/2017  Glucose 70 - 140 mg/dL 146(H) 162(H) 107  BUN 7 - 26 mg/dL '21 22 22  '$ Creatinine 0.60 - 1.10 mg/dL 0.98 0.95 0.86  Sodium 136 - 145 mmol/L 139 137 141  Potassium 3.5 - 5.1 mmol/L 3.9 4.2 3.9  Chloride 98 - 109 mmol/L 105 105 108  CO2 22 - 29 mmol/L 20(L) 20(L) 21(L)  Calcium 8.4 - 10.4 mg/dL 9.7 9.8 9.4  Total Protein 6.4 - 8.3 g/dL 6.5 6.1(L) 6.2(L)  Total Bilirubin 0.2 - 1.2 mg/dL 0.7 0.7 0.6  Alkaline Phos 40 - 150 U/L 51 51 48  AST 5 - 34 U/L '18 18 18  '$ ALT 0 - 55 U/L '17 14 17    '$ SPEP M-PROTEIN 10/01/2013: 0.18 (nadir)  10/13/2014: 0.6 11/21/2014: 0.5 01/30/2015: 0.6 03/13/2015: 0.6 04/20/2015: 0.5 06/01/2015: 0.5 07/20/2015: 0.7 09/14/2015: 0.7 10/26/2015: 0.8 12/07/2015: 1.0 02/07/2016: 1.1  04/03/2016: 0.2 05/03/2016: 0.2  05/31/2016:  0.1 06/28/2016: 0.1 07/25/2016: 0.1 08/21/2016: 0.1 09/27/2016: 0.1 10/23/16: 0.1 12/27/2016: 0.1 (Dara Specific IFE was still positive) 01/24/17: 0.1 02/28/17: 0.1 03/28/17: 0.1 04/25/17: 0.1 05/23/17: 0.1 06/27/17: 0.1 08/01/17: 0.1 2:15/19: 0.1  IgG ([id number]) 10/13/2014: 1710 11/21/2014: 1460 01/30/2015: 1380 03/13/15: 1570 04/20/2015: 1550 06/01/2015: 1590 07/22/2015: 1770 09/14/2015: 3536 10/26/2015: 1898 12/07/2015: 2045 02/07/2016: 1883 04/03/2016: 787 05/03/2016: 685  05/31/2016: 544 06/28/2016: 463 07/25/2016: 455 08/21/2016: 426 09/27/2016: 438 10/23/16: 374 12/06/16: 370 12/27/16: 343, 346 01/24/17: 332  02/28/17: 362 03/28/17:  436 04/25/17: 431 05/23/17: 471 06/27/17: 429 08/01/17: 392 08/29/17: 409  Serum kappa, lambda light chains, and ratio  11/21/2014: 3.95, 2.08, 1.90  01/30/2015: 3.92, 2.64, 1.48 03/13/2015: 3.55, 2.17, 1.64 04/20/15: 4.52, 1.78, 2.54 06/01/2015: 4.82, 1.75, 2.75 07/20/2015: 8.71, 2.43, 3.58 09/14/2015: 9.17, 2.02, 4.55 10/26/2015: 11.4, 1.75, 6.42 12/07/2015: 1.54, 1.83, 8.44 02/07/2016: 1.93, 1.42, 13.6 04/03/2016: 0.96, 0.95, 1.01  05/03/2016: 0.71, 0.34, 2.09  05/31/2016: 0.48, 0.26, 1.85 06/28/2016: 0.52, 0.60, 0.87 07/25/2016: 0.46, 0.27, 1.70 08/21/2016: 0.43, 0.27, 1.59 09/27/2016: 0.52, 0.24, 2.17 10/23/16: .0.49, 0.35, 1.40 12/06/16: 0.68, 0.29, 2.34 12/27/16: 0.54, 0.29, 1.86 02/28/17: 0.56, 0.30, 1.87 04/11/17: 0.61, 0.29, 2.10 05/09/17: 0.57, 0.33, 1.73 06/13/17: 0.65, 0.23, 2.83 07/18/17: 0.75 0.26, 2.83 08/15/17: 0.78, 0.27, 2.89 08/29/17: 0.79, 0.3, 2.63  24 hr urine PEP (total protein, M-spike) 10/13/2014: (-) 02/12/2016: '265mg'$ , '68mg'$  04/05/2016: 114 mg, (-) 08/08/16: 99, (-) 12/27/16: '59mg'$ , (-) 03/28/17: '96mg'$ , (-)   PATHOLOGY REPORT  Diagnosis 01/24/2016 Bone Marrow, Aspirate,Biopsy, and Clot, RT iliac and core - VARIABLY CELLULAR BONE MARROW WITH PLASMA CELL NEOPLASM. - SEE COMMENT. PERIPHERAL BLOOD: - MACROCYTIC ANEMIA.  Diagnosis Note The  bone marrow is variably cellular with trilineage hematopoiesis and non specific myeloid changes. In this background, the plasma cell component is variable ranging  from less than 10 % in many areas to 30% in some aspirate particles with relatively low cellularity. Foci of interstitial infiltrates and small clusters are also seen in the clot section. Immunohistochemical stains show kappa light chain restriction in atypical plasma cell clusters and infiltrates consistent with plasma cell neoplasm. Correlation with cytogenetic and FISH studies is recommended. (BNS:gt, 02-11-2016)  Diagnosis 03/28/16 PLEURAL FLUID, LEFT (SPECIMEN 1 OF 1 COLLECTED 03/28/16): REACTIVE MESOTHELIAL CELLS  RADIOGRAPHIC STUDIES:  ECHO 03/12/17 Study Conclusions - Left ventricle: The cavity size was normal. Wall thickness was   increased in a pattern of mild LVH. Systolic function was normal.   The estimated ejection fraction was in the range of 50% to 55%.   There is hypokinesis of the basalinferior myocardium. Doppler   parameters are consistent with abnormal left ventricular   relaxation (grade 1 diastolic dysfunction). - Aortic valve: There was trivial regurgitation. - Mitral valve: Calcified annulus. Mildly thickened leaflets .   There was trivial regurgitation. - Tricuspid valve: There was mild regurgitation. Impressions: - There has been mild improvement in EF since prior study.    Echo 12/03/16 -Left Ventricle: Systolic function was   mildly to moderately reduced. The estimated ejection fraction was   in the range of 40% to 45%. Diffuse hypokinesis. Doppler  parameters are consistent with abnormal left ventricular relaxation (grade 1 diastolic dysfunction). Doppler parameters are consistent with indeterminate ventricular filling pressure. - Left atrium: The atrium was severely dilated. - Tricuspid valve: There was trivial regurgitation. - Pulmonary arteries: Systolic pressure was within the normal   range. PA  peak pressure: 33 mm Hg (S). - Global longitudinal strain -10.5% (abnormal)  Echo 08/26/2016 - Left ventricle: LVEF is approximately 35% with diffuse hypokinesis with severe hypokinesis/ akinesis of the inferior/inferoseptal walls. In comparison to echo images from December 2017, there does not appear to be significant change The cavity size was normal. Wall thickness was normal. Doppler parameters are consistent with abnormal left ventricular relaxation (grade 1 diastolic dysfunction). - Aortic valve: AV is thickened. 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 Present in echo from December 2017 There was trivial regurgitation. - Right ventricle: Systolic function was mildly reduced. - Right atrium: The atrium was mildly dilated. - Pericardium, extracardiac: A trivial pericardial effusion was identified.  ASSESSMENT: Shannon Rush 77 y.o. female with a history of Multiple Myeloma s/p autotransplant in August 2014 followed by maintenance low dose Revlimid therapy, and relapsed in 01/2016  PLAN:   1. IgG kappa Multiple myeloma s/p Auto SCT, intermediate risk, relapsed in 01/2016 --MM markers demonstrate a very good initial response (M protein baseline 2.6, down to 0.18 after induction chemo and transplant, stable around 0.5 since July 2015, but has gradually increased lately),  she unfortunately relapsed in July 2017 -I previously discussed her repeated bone marrow biopsy from 02/01/2016, which showed 10-30% plasma cells in her marrow -I agreed with Dr. Kendell Bane recommendation to stop Revlimid and change to Daratumumab, Pomalidomide and dexamethasone due to her disease relapse. I don't think she has resistance to Revlimid, she has been on very small dose of Revlimid due to her prior history of multiple infections. -She started first cycle DaraPD in 02/2016, but in 2 weeks she developed severe pancytopenia, pneumonia with sepsis required 3  weeks hospital stay. She did have a very nice response to the treatment, her M-protein has significantly reduced from 1.2 to 0.2, light chain levels normalized even with half cycle  treatment  -I recommended her to change regiment to [place], and started her on dara every 2 weeks for 2 doses, she tolerated this very well -she has had good response to treatment, M-protein significantly dropped -Her previous echo in December 2017 showed a significant drop on EF, she is not much symptomatically for CHF, due to the potential contribution of Velcade to her CHF, I'll hold her Velcade for now. Her repeated echo in May 2018 showed a significant improvement, EF 40-45%.. -her dara-specific IFE in 12/2016 was still positive, she has low level M-protein, she has achieved a good partial response. -She hasn't had Velcade since December 2017. Will continue holding it for now due to her CHF -We discussed her ECHO from 03/12/17 that showed her heart function has normalized to EF of 50% to 55%.  -She has seen Dr. [name] at [facility] who suggests she use a combination treatment of Dara + low dose Pomalidomide with and increase treatment to every 2 weeks.  -I previously discussed the available data of Daratumumab as single agent or in combination with Pomalidomide or Velcade. Due to her previous severe cytopenia and infection, and a prolonged excellent response to single agent Dara, I did not recommend her to change to DaraPD again.  We decided to change Dara/dexa treatment to every 2 weeks on 03/28/17, she has been tolerating well overall. Her M-protein level has not changed (0.1) since then  -if she has disease progression, I would consider adding Velcade back -I again advised her to drink adequate water, take magnesium, continue calcium and vitamin D to help her muscle cramping -CBC reviewed, CBC and MM panel overall stable. Overall adequate to proceed with Dara infusion today. -I advised her to have a Hep B test soon.  -She  is clinically doing well, no significant side effects from treatment so far, will continue Dara/Dexa every 2 weeks  -F/u in 4 weeks    2. Pancytopenia  -secondary to Dara, slightly worse after we increased Dara to every 2 weeks  -Continue monitoring closely. -She has mild anemia, overall stable -Blood counts stable except continued mild anemia   3. CHF, CAD, inferior STEMI in 02/2014 -continue metoprolol and lipitor, Dr. Meda Coffee previously started her on lisinopril also -previously followed up with Dr. Meda Coffee  -her previous echo on 08/26/16 showed improvement with EF 30-35% from 25-30%, further improved to 40-45%, echo in May 2018 -Dr. Meda Coffee plans to repeat her echo every 3 months when she is on treatment. -Her 03/12/17 ECHO shows improved heart function to normal range of EF at 50% to 55%. -I continued to encourage her to watch for leg swelling and SBO and chest pain as these are symptoms of her heart condition.   4. Osteoporosis -previous bone density scan showed worsening osteoporosis, continue Zometa, changed to every 3 months after 03/2015.  -she declined repeating DEXA  -she was on Zometa for 4 years, the risk is probably outweighs the benefit at this point, we have stopped it, last injection was 10/23/16  5. Peripheral neuropathy in feet, G1-2 -Secondary to previous chemotherapy. Slightly worse lately. -We previously discussed the role of Neurontin, she declined. -I suggested Vitamin B complex and continue practice of circulation of her feet. I advised her to be careful to not fall.  -Will monitor, she will let us know if this worsens.    6. Goal of care discussion  -We again discussed the incurable nature of her cancer, and the overall poor prognosis, especially if she does not have  good response to chemotherapy or progress on chemo -The patient understands the goal of care is palliative. -she is full code now    Plan -Lab, flush and dara today and every 2 weeks  -she will  continue to take dexa '20mg'$  the day before Dara infusion  -F/u in 4 weeks   All questions were answered. The patient knows to call the clinic with any problems, questions or concerns. We can certainly see the patient much sooner if necessary.  I spent 15 minutes counseling the patient face to face. The total time spent in the appointment was 20 minutes.  This document serves as a record of services personally performed by [name], MD. It was created on her behalf by Theresia Bough, a trained medical scribe. The creation of this record is based on the scribe's personal observations and the provider's statements to them.   I have reviewed the above documentation for accuracy and completeness, and I agree with the above.    [name]  09/12/2017 5:31 PM

## 2017-09-11 DIAGNOSIS — C9000 Multiple myeloma not having achieved remission: Secondary | ICD-10-CM | POA: Diagnosis not present

## 2017-09-12 ENCOUNTER — Inpatient Hospital Stay: Payer: Medicare Other

## 2017-09-12 ENCOUNTER — Encounter: Payer: Self-pay | Admitting: Hematology

## 2017-09-12 ENCOUNTER — Other Ambulatory Visit: Payer: Medicare Other

## 2017-09-12 ENCOUNTER — Inpatient Hospital Stay (HOSPITAL_BASED_OUTPATIENT_CLINIC_OR_DEPARTMENT_OTHER): Payer: Medicare Other | Admitting: Hematology

## 2017-09-12 ENCOUNTER — Ambulatory Visit: Payer: Medicare Other

## 2017-09-12 ENCOUNTER — Inpatient Hospital Stay: Payer: Medicare Other | Attending: Hematology

## 2017-09-12 VITALS — BP 127/76 | HR 71 | Temp 97.7°F | Resp 17 | Ht 68.0 in | Wt 127.5 lb

## 2017-09-12 DIAGNOSIS — Z9481 Bone marrow transplant status: Secondary | ICD-10-CM | POA: Insufficient documentation

## 2017-09-12 DIAGNOSIS — Z9221 Personal history of antineoplastic chemotherapy: Secondary | ICD-10-CM

## 2017-09-12 DIAGNOSIS — C9000 Multiple myeloma not having achieved remission: Secondary | ICD-10-CM

## 2017-09-12 DIAGNOSIS — G620 Drug-induced polyneuropathy: Secondary | ICD-10-CM | POA: Diagnosis not present

## 2017-09-12 DIAGNOSIS — Z79899 Other long term (current) drug therapy: Secondary | ICD-10-CM

## 2017-09-12 DIAGNOSIS — Z9225 Personal history of immunosupression therapy: Secondary | ICD-10-CM

## 2017-09-12 DIAGNOSIS — Z8701 Personal history of pneumonia (recurrent): Secondary | ICD-10-CM | POA: Diagnosis not present

## 2017-09-12 DIAGNOSIS — T451X5S Adverse effect of antineoplastic and immunosuppressive drugs, sequela: Secondary | ICD-10-CM | POA: Insufficient documentation

## 2017-09-12 DIAGNOSIS — C9002 Multiple myeloma in relapse: Secondary | ICD-10-CM

## 2017-09-12 DIAGNOSIS — Z5112 Encounter for antineoplastic immunotherapy: Secondary | ICD-10-CM | POA: Diagnosis not present

## 2017-09-12 DIAGNOSIS — M810 Age-related osteoporosis without current pathological fracture: Secondary | ICD-10-CM | POA: Diagnosis not present

## 2017-09-12 DIAGNOSIS — C9001 Multiple myeloma in remission: Secondary | ICD-10-CM

## 2017-09-12 DIAGNOSIS — I509 Heart failure, unspecified: Secondary | ICD-10-CM

## 2017-09-12 DIAGNOSIS — D61811 Other drug-induced pancytopenia: Secondary | ICD-10-CM | POA: Diagnosis not present

## 2017-09-12 DIAGNOSIS — I2510 Atherosclerotic heart disease of native coronary artery without angina pectoris: Secondary | ICD-10-CM | POA: Insufficient documentation

## 2017-09-12 DIAGNOSIS — I252 Old myocardial infarction: Secondary | ICD-10-CM | POA: Diagnosis not present

## 2017-09-12 DIAGNOSIS — M818 Other osteoporosis without current pathological fracture: Secondary | ICD-10-CM

## 2017-09-12 LAB — COMPREHENSIVE METABOLIC PANEL
ALBUMIN: 4.1 g/dL (ref 3.5–5.0)
ALT: 17 U/L (ref 0–55)
AST: 18 U/L (ref 5–34)
Alkaline Phosphatase: 51 U/L (ref 40–150)
Anion gap: 14 — ABNORMAL HIGH (ref 3–11)
BUN: 21 mg/dL (ref 7–26)
CHLORIDE: 105 mmol/L (ref 98–109)
CO2: 20 mmol/L — AB (ref 22–29)
CREATININE: 0.98 mg/dL (ref 0.60–1.10)
Calcium: 9.7 mg/dL (ref 8.4–10.4)
GFR calc Af Amer: >60 mL/min (ref >60)
GFR calc non Af Amer: 55 mL/min — ABNORMAL LOW (ref >60)
GLUCOSE: 146 mg/dL — AB (ref 70–140)
POTASSIUM: 3.9 mmol/L (ref 3.5–5.1)
SODIUM: 139 mmol/L (ref 136–145)
Total Bilirubin: 0.7 mg/dL (ref 0.2–1.2)
Total Protein: 6.5 g/dL (ref 6.4–8.3)

## 2017-09-12 LAB — CBC WITH DIFFERENTIAL (CANCER CENTER ONLY)
BASOS ABS: 0 10*3/uL (ref 0.0–0.1)
BASOS PCT: 0 %
EOS ABS: 0 10*3/uL (ref 0.0–0.5)
EOS PCT: 0 %
HCT: 34.4 % — ABNORMAL LOW (ref 34.8–46.6)
Hemoglobin: 11.5 g/dL — ABNORMAL LOW (ref 11.6–15.9)
LYMPHS PCT: 3 %
Lymphs Abs: 0.2 10*3/uL — ABNORMAL LOW (ref 0.9–3.3)
MCH: 35.8 pg — ABNORMAL HIGH (ref 25.1–34.0)
MCHC: 33.4 g/dL (ref 31.5–36.0)
MCV: 107.2 fL — AB (ref 79.5–101.0)
MONO ABS: 0.1 10*3/uL (ref 0.1–0.9)
Monocytes Relative: 2 %
Neutro Abs: 6.2 10*3/uL (ref 1.5–6.5)
Neutrophils Relative %: 95 %
PLATELETS: 159 10*3/uL (ref 145–400)
RBC: 3.21 MIL/uL — ABNORMAL LOW (ref 3.70–5.45)
RDW: 14.5 % (ref 11.2–14.5)
WBC Count: 6.4 10*3/uL (ref 3.9–10.3)

## 2017-09-12 MED ORDER — ACETAMINOPHEN 325 MG PO TABS
650.0000 mg | ORAL_TABLET | Freq: Once | ORAL | Status: AC
  Administered 2017-09-12: 650 mg via ORAL

## 2017-09-12 MED ORDER — MONTELUKAST SODIUM 10 MG PO TABS
10.0000 mg | ORAL_TABLET | Freq: Once | ORAL | Status: AC
  Administered 2017-09-12: 10 mg via ORAL

## 2017-09-12 MED ORDER — METHYLPREDNISOLONE SODIUM SUCC 125 MG IJ SOLR
125.0000 mg | Freq: Once | INTRAMUSCULAR | Status: AC
  Administered 2017-09-12: 125 mg via INTRAVENOUS

## 2017-09-12 MED ORDER — DIPHENHYDRAMINE HCL 25 MG PO CAPS
ORAL_CAPSULE | ORAL | Status: AC
  Filled 2017-09-12: qty 2

## 2017-09-12 MED ORDER — SODIUM CHLORIDE 0.9 % IV SOLN
16.0000 mg/kg | Freq: Once | INTRAVENOUS | Status: AC
  Administered 2017-09-12: 900 mg via INTRAVENOUS
  Filled 2017-09-12: qty 5

## 2017-09-12 MED ORDER — DIPHENHYDRAMINE HCL 25 MG PO CAPS
50.0000 mg | ORAL_CAPSULE | Freq: Once | ORAL | Status: AC
  Administered 2017-09-12: 50 mg via ORAL

## 2017-09-12 MED ORDER — MONTELUKAST SODIUM 10 MG PO TABS
ORAL_TABLET | ORAL | Status: AC
  Filled 2017-09-12: qty 1

## 2017-09-12 MED ORDER — ACETAMINOPHEN 325 MG PO TABS
ORAL_TABLET | ORAL | Status: AC
  Filled 2017-09-12: qty 2

## 2017-09-12 MED ORDER — METHYLPREDNISOLONE SODIUM SUCC 125 MG IJ SOLR
INTRAMUSCULAR | Status: AC
  Filled 2017-09-12: qty 2

## 2017-09-12 MED ORDER — SODIUM CHLORIDE 0.9 % IV SOLN
Freq: Once | INTRAVENOUS | Status: AC
  Administered 2017-09-12: 13:00:00 via INTRAVENOUS

## 2017-09-12 NOTE — Patient Instructions (Signed)
[facility] Discharge Instructions for Patients Receiving Chemotherapy  Today you received the following chemotherapy agents Darzalex.  To help prevent nausea and vomiting after your treatment, we encourage you to take your nausea medication as directed.  If you develop nausea and vomiting that is not controlled by your nausea medication, call the clinic.   BELOW ARE SYMPTOMS THAT SHOULD BE REPORTED IMMEDIATELY:  *FEVER GREATER THAN 100.5 F  *CHILLS WITH OR WITHOUT FEVER  NAUSEA AND VOMITING THAT IS NOT CONTROLLED WITH YOUR NAUSEA MEDICATION  *UNUSUAL SHORTNESS OF BREATH  *UNUSUAL BRUISING OR BLEEDING  TENDERNESS IN MOUTH AND THROAT WITH OR WITHOUT PRESENCE OF ULCERS  *URINARY PROBLEMS  *BOWEL PROBLEMS  UNUSUAL RASH Items with * indicate a potential emergency and should be followed up as soon as possible.  Feel free to call the clinic you have any questions or concerns. The clinic phone number is [phone].  Please show the CHEMO ALERT CARD at check-in to the Emergency Department and triage nurse.    

## 2017-09-15 ENCOUNTER — Telehealth: Payer: Self-pay | Admitting: Hematology

## 2017-09-15 LAB — UIFE/LIGHT CHAINS/TP QN, 24-HR UR
% BETA, URINE: 0 %
ALPHA 1 URINE: 0 %
ALPHA 2 UR: 0 %
Albumin, U: 100 %
FREE LAMBDA LT CHAINS, UR: 0.35 mg/L (ref 0.24–6.66)
Free Kappa Lt Chains,Ur: 10.7 mg/L (ref 1.35–24.19)
Free Kappa/Lambda Ratio: 30.57 — ABNORMAL HIGH (ref 2.04–10.37)
GAMMA GLOBULIN URINE: 0 %
TOTAL PROTEIN, URINE-UPE24: 6 mg/dL
TOTAL VOLUME: 1650
Total Protein, Urine-Ur/day: 99 mg/24 hr (ref 30–150)

## 2017-09-15 NOTE — Telephone Encounter (Signed)
Appointments scheduled per 3/1 los/ AVS/[place] printed and mailed to patient.

## 2017-09-15 NOTE — Telephone Encounter (Signed)
Tried to call patient regarding voicemail °

## 2017-09-22 ENCOUNTER — Ambulatory Visit: Payer: Medicare Other | Admitting: Cardiology

## 2017-09-25 ENCOUNTER — Other Ambulatory Visit: Payer: Self-pay | Admitting: Hematology

## 2017-09-26 ENCOUNTER — Inpatient Hospital Stay: Payer: Medicare Other

## 2017-09-26 ENCOUNTER — Other Ambulatory Visit: Payer: Self-pay | Admitting: *Deleted

## 2017-09-26 VITALS — BP 137/99 | HR 89 | Temp 98.7°F | Resp 18

## 2017-09-26 DIAGNOSIS — M810 Age-related osteoporosis without current pathological fracture: Secondary | ICD-10-CM

## 2017-09-26 DIAGNOSIS — C9000 Multiple myeloma not having achieved remission: Secondary | ICD-10-CM

## 2017-09-26 DIAGNOSIS — I2510 Atherosclerotic heart disease of native coronary artery without angina pectoris: Secondary | ICD-10-CM | POA: Diagnosis not present

## 2017-09-26 DIAGNOSIS — Z5112 Encounter for antineoplastic immunotherapy: Secondary | ICD-10-CM | POA: Diagnosis not present

## 2017-09-26 DIAGNOSIS — Z9481 Bone marrow transplant status: Secondary | ICD-10-CM | POA: Diagnosis not present

## 2017-09-26 DIAGNOSIS — D61818 Other pancytopenia: Secondary | ICD-10-CM

## 2017-09-26 DIAGNOSIS — D61811 Other drug-induced pancytopenia: Secondary | ICD-10-CM | POA: Diagnosis not present

## 2017-09-26 DIAGNOSIS — I252 Old myocardial infarction: Secondary | ICD-10-CM

## 2017-09-26 DIAGNOSIS — C9002 Multiple myeloma in relapse: Secondary | ICD-10-CM

## 2017-09-26 DIAGNOSIS — Z9221 Personal history of antineoplastic chemotherapy: Secondary | ICD-10-CM | POA: Diagnosis not present

## 2017-09-26 DIAGNOSIS — G620 Drug-induced polyneuropathy: Secondary | ICD-10-CM | POA: Diagnosis not present

## 2017-09-26 DIAGNOSIS — Z79899 Other long term (current) drug therapy: Secondary | ICD-10-CM | POA: Diagnosis not present

## 2017-09-26 DIAGNOSIS — I509 Heart failure, unspecified: Secondary | ICD-10-CM | POA: Diagnosis not present

## 2017-09-26 DIAGNOSIS — T451X5S Adverse effect of antineoplastic and immunosuppressive drugs, sequela: Secondary | ICD-10-CM | POA: Diagnosis not present

## 2017-09-26 DIAGNOSIS — C9001 Multiple myeloma in remission: Secondary | ICD-10-CM

## 2017-09-26 DIAGNOSIS — Z9225 Personal history of immunosupression therapy: Secondary | ICD-10-CM | POA: Diagnosis not present

## 2017-09-26 LAB — COMPREHENSIVE METABOLIC PANEL
ALK PHOS: 48 U/L (ref 40–150)
ALT: 16 U/L (ref 0–55)
AST: 23 U/L (ref 5–34)
Albumin: 3.9 g/dL (ref 3.5–5.0)
Anion gap: 6 (ref 3–11)
BUN: 15 mg/dL (ref 7–26)
CALCIUM: 9.6 mg/dL (ref 8.4–10.4)
CHLORIDE: 109 mmol/L (ref 98–109)
CO2: 25 mmol/L (ref 22–29)
CREATININE: 0.79 mg/dL (ref 0.60–1.10)
Glucose, Bld: 93 mg/dL (ref 70–140)
Potassium: 4 mmol/L (ref 3.5–5.1)
Sodium: 140 mmol/L (ref 136–145)
Total Bilirubin: 0.6 mg/dL (ref 0.2–1.2)
Total Protein: 6.1 g/dL — ABNORMAL LOW (ref 6.4–8.3)

## 2017-09-26 LAB — CBC WITH DIFFERENTIAL (CANCER CENTER ONLY)
Basophils Absolute: 0 10*3/uL (ref 0.0–0.1)
Basophils Relative: 1 %
EOS PCT: 0 %
Eosinophils Absolute: 0 10*3/uL (ref 0.0–0.5)
HCT: 33.1 % — ABNORMAL LOW (ref 34.8–46.6)
Hemoglobin: 11.1 g/dL — ABNORMAL LOW (ref 11.6–15.9)
LYMPHS ABS: 0.2 10*3/uL — AB (ref 0.9–3.3)
LYMPHS PCT: 4 %
MCH: 35.6 pg — AB (ref 25.1–34.0)
MCHC: 33.5 g/dL (ref 31.5–36.0)
MCV: 106.1 fL — AB (ref 79.5–101.0)
MONOS PCT: 2 %
Monocytes Absolute: 0.1 10*3/uL (ref 0.1–0.9)
Neutro Abs: 4.3 10*3/uL (ref 1.5–6.5)
Neutrophils Relative %: 93 %
Platelet Count: 140 10*3/uL — ABNORMAL LOW (ref 145–400)
RBC: 3.12 MIL/uL — AB (ref 3.70–5.45)
RDW: 14.9 % — ABNORMAL HIGH (ref 11.2–14.5)
WBC Count: 4.6 10*3/uL (ref 3.9–10.3)

## 2017-09-26 MED ORDER — MONTELUKAST SODIUM 10 MG PO TABS
ORAL_TABLET | ORAL | Status: AC
  Filled 2017-09-26: qty 1

## 2017-09-26 MED ORDER — ACETAMINOPHEN 325 MG PO TABS
650.0000 mg | ORAL_TABLET | Freq: Once | ORAL | Status: AC
  Administered 2017-09-26: 650 mg via ORAL

## 2017-09-26 MED ORDER — MONTELUKAST SODIUM 10 MG PO TABS
10.0000 mg | ORAL_TABLET | Freq: Once | ORAL | Status: AC
  Administered 2017-09-26: 10 mg via ORAL

## 2017-09-26 MED ORDER — SODIUM CHLORIDE 0.9 % IV SOLN
16.0000 mg/kg | Freq: Once | INTRAVENOUS | Status: AC
  Administered 2017-09-26: 900 mg via INTRAVENOUS
  Filled 2017-09-26: qty 40

## 2017-09-26 MED ORDER — DIPHENHYDRAMINE HCL 25 MG PO CAPS
50.0000 mg | ORAL_CAPSULE | Freq: Once | ORAL | Status: AC
  Administered 2017-09-26: 50 mg via ORAL

## 2017-09-26 MED ORDER — METHYLPREDNISOLONE SODIUM SUCC 125 MG IJ SOLR
125.0000 mg | Freq: Once | INTRAMUSCULAR | Status: AC
  Administered 2017-09-26: 125 mg via INTRAVENOUS

## 2017-09-26 MED ORDER — SODIUM CHLORIDE 0.9 % IV SOLN
Freq: Once | INTRAVENOUS | Status: AC
  Administered 2017-09-26: 12:00:00 via INTRAVENOUS

## 2017-09-26 MED ORDER — DEXAMETHASONE 4 MG PO TABS: ORAL_TABLET | ORAL | 1 refills | Status: DC

## 2017-09-26 MED ORDER — DIPHENHYDRAMINE HCL 25 MG PO CAPS
ORAL_CAPSULE | ORAL | Status: AC
  Filled 2017-09-26: qty 2

## 2017-09-26 MED ORDER — METHYLPREDNISOLONE SODIUM SUCC 125 MG IJ SOLR
INTRAMUSCULAR | Status: AC
  Filled 2017-09-26: qty 2

## 2017-09-26 MED ORDER — ACETAMINOPHEN 325 MG PO TABS
ORAL_TABLET | ORAL | Status: AC
  Filled 2017-09-26: qty 2

## 2017-09-26 NOTE — Patient Instructions (Signed)
[facility] Discharge Instructions for Patients Receiving Chemotherapy  Today you received the following chemotherapy agents Darzalex.  To help prevent nausea and vomiting after your treatment, we encourage you to take your nausea medication as directed.  If you develop nausea and vomiting that is not controlled by your nausea medication, call the clinic.   BELOW ARE SYMPTOMS THAT SHOULD BE REPORTED IMMEDIATELY:  *FEVER GREATER THAN 100.5 F  *CHILLS WITH OR WITHOUT FEVER  NAUSEA AND VOMITING THAT IS NOT CONTROLLED WITH YOUR NAUSEA MEDICATION  *UNUSUAL SHORTNESS OF BREATH  *UNUSUAL BRUISING OR BLEEDING  TENDERNESS IN MOUTH AND THROAT WITH OR WITHOUT PRESENCE OF ULCERS  *URINARY PROBLEMS  *BOWEL PROBLEMS  UNUSUAL RASH Items with * indicate a potential emergency and should be followed up as soon as possible.  Feel free to call the clinic you have any questions or concerns. The clinic phone number is [phone].  Please show the CHEMO ALERT CARD at check-in to the Emergency Department and triage nurse.    

## 2017-09-29 LAB — KAPPA/LAMBDA LIGHT CHAINS
KAPPA, LAMDA LIGHT CHAIN RATIO: 3.38 — AB (ref 0.26–1.65)
Kappa free light chain: 8.8 mg/L (ref 3.3–19.4)
LAMDA FREE LIGHT CHAINS: 2.6 mg/L — AB (ref 5.7–26.3)

## 2017-09-30 LAB — PROTEIN ELECTROPHORESIS, SERUM
A/G RATIO SPE: 1.8 — AB (ref 0.7–1.7)
ALPHA-2-GLOBULIN: 0.6 g/dL (ref 0.4–1.0)
Albumin ELP: 3.7 g/dL (ref 2.9–4.4)
Alpha-1-Globulin: 0.3 g/dL (ref 0.0–0.4)
Beta Globulin: 0.8 g/dL (ref 0.7–1.3)
GLOBULIN, TOTAL: 2.1 g/dL — AB (ref 2.2–3.9)
Gamma Globulin: 0.4 g/dL (ref 0.4–1.8)
M-SPIKE, %: 0.2 g/dL — AB
Total Protein ELP: 5.8 g/dL — ABNORMAL LOW (ref 6.0–8.5)

## 2017-09-30 LAB — IFE, DARA-SPECIFIC, SERUM
IGG (IMMUNOGLOBIN G), SERUM: 421 mg/dL — AB (ref 700–1600)
IgM (Immunoglobulin M), Srm: 14 mg/dL — ABNORMAL LOW (ref 26–217)

## 2017-10-05 ENCOUNTER — Encounter: Payer: Self-pay | Admitting: Hematology

## 2017-10-06 ENCOUNTER — Encounter: Payer: Self-pay | Admitting: *Deleted

## 2017-10-08 NOTE — Progress Notes (Signed)
[name]COL[facility] OFFICE PROGRESS NOTE  PCP: [name], MD [place] [address]  DIAGNOSIS: Multiple myeloma with relapse  CHIEF COMPLAINTS: Follow-up of multiple myeloma  Oncology History   Multiple myeloma ([name])   Staging form: Multiple Myeloma, AJCC 6th Edition   - Clinical: Stage IIIA - Signed by [name], MD on 09/04/2013      Multiple myeloma ([name])   05/26/2012 Initial Diagnosis    Presenting IgG was 4,040 mg/dL on 06/05/2012 (IgA 35; IgM 34); kappa free light chain 34.4 mg/dL, lambda 0.00, kappa:lambda ratio of 34.75; SPEP with M-spike of 2.60.      06/30/2012 Imaging    Numerous lytic lesions throughout the calvarium.  Lytic lesion within the left lateral scapula.  Findings most compatible with metastases or myeloma. Multiple mid and lower thoracic compression fractures.  Slight compression through the endplates at L3.      85/88/5027 Bone Marrow Biopsy    Bone marrow biopsy showed 49% plasma cell. Normal classical cytogenetics; however, myeloma FISH panel showed 13q- (intermediate risk)          07/14/2012 - 07/27/2012 Radiation Therapy    Palliative radiation 20 Gy over 10 fractions between to thoracic spine cord compression and symptomatic left scapula lytic lesion.      08/10/2012 - 11/2012 Chemotherapy    Started SQ Velcade once weekly, 3 weeks on, 1 weeks off; daily Revlimid d1-21, 7 days off; and Dexamethasone 76m PO weekly.       02/12/2013 Bone Marrow Transplant    [facility]       06/14/2013 Tumor Marker    IgG  1830,  Kappa:lamba ratio, 1.69 (baseline was 34.75 or   M-spike 0.28 (baseline of 2.6 or  89.3% of baseline).        06/25/2013 -  Chemotherapy    Started zometa 3.5 mg monthly       09/01/2013 - 01/2014 Chemotherapy    Maintenance therapy with revlimid 581mdaily for 14 days and then off for 7 (decreased from 21 days on and 7 days off based on neutropenia). Stopped due  to disease progression 01/2017      09/13/2013 - 09/17/2014 Hospital Admission    Hospital admission for pneumonia      12/20/2013 Treatment Plan Change    Maintenance therapy decreased to 2.5 mg daily for 21 days and then off for 7 days based on low counts.       01/25/2014 Tumor Marker    IgG 1360 M spike 0.5      03/01/2014 - 03/05/2014 Hospital Ad[facility], received thrombolytic therapy, cath showed 60% prox RCA, mid-distal RCA 50%, Echo normal, no stents.      04/11/2014 - 08/07/2014 Chemotherapy    Continue Revlimid at 2.5 mg 3 weeks on 1 week off      08/08/2014 - 08/13/2014 Hospital Admission    Hospital admission for pneumonia. Her Revlimid was held.      08/29/2014 - 09/14/2014 Chemotherapy    Maintenance Revlimid restarted      09/14/2014 - 09/17/2014 Hospital Admission    [place]      11/13/2014 - 12/25/2015 Chemotherapy    Maintenance Revlimid restarted, changed to 2.64m77maily, 2 weks on, 1 week off from 12/15/2014, stopped due to disease progression  01/24/2016 Progression    Patient is M protein has gradually increased to 1.0g, repeated a bone marrow biopsy showed plasma cell 10-30%      02/27/2016 - 03/07/2016 Chemotherapy    Pomalidomide 4 mg daily on day 1-21, dexamethasone 40 mg on day 1, 8 and 15, every 28 days, started on 02/27/2016, daratumumab weekly started on 03/07/2016, held after first dose, due to hospitalization and a severe pancytopenia, pomalidomide was stopped afterwards       03/10/2016 - 03/29/2016 Hospital Admission    Pt was admitted for sepsis from pneumonia, and severe pancytopenia. She required ICU stay for a few days due to hypotension, developed b/l pleural effusion required diuretics and b/l thoracentesis. She also required blood and plt transfusion, and prolonged neupogen injection for severe  neutropenia. She was discharged home after 3 weeks hospital stay       04/26/2016 -  Chemotherapy    Daratumumab restarted on 04/26/2016, Velcade 1.44m/m2 and dexa 211mweekly start on 11/3, velcade held since 06/28/2016 due to CHF       08/26/2016 Echocardiogram    - Left ventricle: LVEF is approximately 35% with diffuse diffuse   hypokinesis with severe hypokinesis/ akinesis of the   inferior/inferoseptal walls. In comparison to echo images from   December 2017, there does not appear to be significant change The   cavity size was normal. Wall thickness was normal. Doppler   parameters are consistent with abnormal left ventricular   relaxation (grade 1 diastolic dysfunction). - Aortic valve: AV is thickened. 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 Present in echo from December 2017 There was   trivial regurgitation. - Right ventricle: Systolic function was mildly reduced. - Right atrium: The atrium was mildly dilated. - Pericardium, extracardiac: A trivial pericardial effusion was   identified.      12/03/2016 Echocardiogram    -Left Ventricle: Systolic function was   mildly to moderately reduced. The estimated ejection fraction was   in the range of 40% to 45%. Diffuse hypokinesis. Doppler   parameters are consistent with abnormal left ventricular   relaxation (grade 1 diastolic dysfunction). Doppler parameters   are consistent with indeterminate ventricular filling pressure. - Left atrium: The atrium was severely dilated. - Tricuspid valve: There was trivial regurgitation. - Pulmonary arteries: Systolic pressure was within the normal   range. PA peak pressure: 33 mm Hg (S). - Global longitudinal strain -10.5% (abnormal)      03/12/2017 Echocardiogram    ECHO 03/12/17 Study Conclusions - Left ventricle: The cavity size was normal. Wall thickness was   increased in a pattern of mild LVH. Systolic function was  normal.   The estimated ejection fraction was in the range of 50% to 55%.   There is hypokinesis of the basalinferior myocardium. Doppler   parameters are consistent with abnormal left ventricular   relaxation (grade 1 diastolic dysfunction). - Aortic valve: There was trivial regurgitation. - Mitral valve: Calcified annulus. Mildly thickened leaflets .   There was trivial regurgitation. - Tricuspid valve: There was mild regurgitation. Impressions: - There has been mild improvement in EF since prior study.         CURRENT TREATMENT:   1. Pomalidomide 4 mg daily on day 1-21, dexamethasone 40 mg on day 1, 8 and 15, every 28 days, started on 02/27/2016, daratumumab weekly started on 03/07/2016, treatment on hold since 03/09/2016 due to hospitalization and severe cytopenia 2. Daratumumab restarted on  04/26/2016, Velcade 1.'3mg'$ /m2 and dexa '20mg'$  weekly start on 11/3, velcade held since 06/28/2016 due to CHF. Tonette Bihari was changed from every 4 weeks to every 2 weeks on 03/28/2017 per Dr. Kendell Bane recommendation.    INTERVAL HISTORY:  Shannon Rush 77 y.o. female  here for a follow up and Daratumumab infusion. She presents to the clinic today by herself. She reports she is doing well. She states she is doing the same with no abnormal symptoms.   On review of systems, pt denies fever, leg swelling or any other complaints at this time. Pertinent positives are listed and detailed within the above HPI.   MEDICAL HISTORY: Past Medical History:  Diagnosis Date  . CAD (coronary artery disease)    a. inf STEMI (in [place] >>> [facility]) >>> LHC (8/15- [place]):  pRCA 60%, mid to dist RCA 50% >>> med rx  . Cord compression ([place]) 07/13/12   MRI- diffuse myeloma involvement of T-L spine  . History of radiation therapy 07/13/12-07/27/12   spinal cord compression T3-T10,left scapula  . Hx of echocardiogram    Echo (03/02/14-[facility] at [facility]):  Normal LVEF  without significant regional wall motion abnormalities. Mild   . Multiple myeloma ([place]) 07/01/2012  . OSTEOPOROSIS 06/11/2010   Multiple compression fractures; and spontaneous fracture of sternum Qualifier: Diagnosis of  By: [name], [name]     . Thoracic kyphosis 07/13/12   per MRI scan  . Unspecified deficiency anemia    ALLERGIES:  is allergic to zosyn [piperacillin sod-tazobactam so]; levaquin [levofloxacin in d5w]; other; piperacillin-tazobactam in dex; zithromax [azithromycin]; and ciprofloxacin.  SURGICAL HISTORY:  Past Surgical History:  Procedure Laterality Date  . APPENDECTOMY    . CESAREAN SECTION     x2   . COLONOSCOPY  2007   neg with Dr. [name]  . ELBOW SURGERY    . HIP SURGERY  2009   left  . TUBAL LIGATION     REVIEW OF SYSTEMS:   Constitutional: Denies abnormal weight loss Eyes: Denies blurriness of vision  Ears, nose, mouth, throat, and face: Denies mucositis or sore throat,  Respiratory: Denies dyspnea or wheezes Cardiovascular: Denies palpitation, chest discomfort  Gastrointestinal:  Denies nausea, heartburn or change in bowel habits Skin: Denies abnormal skin rashes  Lymphatics: Denies new lymphadenopathy or easy bruising MSK: (+) muscle cramping in lower legs, hands and diaphragm  Neurological:Denies new weaknesses. Positive for paraesthesias and numbness to the feet (baseline). (+) neuropathy of her feet  Behavioral/Psych: Mood is stable, no new changes  All other systems were reviewed with the patient and are negative.   PHYSICAL EXAMINATION:  ECOG PERFORMANCE STATUS: 1 BP 118/66 (BP Location: Right Arm, Patient Position: Sitting)   Pulse 79   Temp 97.7 F (36.5 C) (Oral)   Resp 18   Ht '5\' 8"'$  (1.727 m)   Wt 126 lb 8 oz (57.4 kg)   SpO2 95%   BMI 19.23 kg/m    GENERAL:alert, no distress and comfortable; easily mobile to exam table; kyphosis, moderate.  SKIN: skin color, texture, turgor are normal, no rashes or significant lesions except a small  healing, dry wound in the right lower leg above his ankle, with surrounding skin erythema and swelling EYES: normal, Conjunctiva are pink, 1 tear/injection blood vessel left eye in sclera. OROPHARYNX:no exudate, no erythema and lips, buccal mucosa, and tongue normal, no ONJ NECK: supple, thyroid normal size, non-tender, without nodularity; nodules cervical bilaterally.  LYMPH:  no palpable  lymphadenopathy in the cervical, axillary or supraclavicular LUNGS: crackles at the bases bilaterally with normal breathing effort, no wheezes or rhonchi HEART: regular rate & rhythm and no murmurs  ABDOMEN:abdomen soft, and normal bowel sounds Musculoskeletal:no cyanosis of digits and no clubbing, NEURO: alert & oriented x 3 with fluent speech, no focal motor/sensory deficits   Labs:  CBC Latest Ref Rng & Units 10/10/2017 09/26/2017 09/12/2017  WBC 3.9 - 10.3 K/uL 5.7 4.6 6.4  Hemoglobin 11.6 - 15.9 g/dL - - -  Hematocrit 34.8 - 46.6 % 33.5(L) 33.1(L) 34.4(L)  Platelets 145 - 400 K/uL 198 140(L) 159    CMP Latest Ref Rng & Units 10/10/2017 09/26/2017 09/12/2017  Glucose 70 - 140 mg/dL 145(H) 93 146(H)  BUN 7 - 26 mg/dL '22 15 21  '$ Creatinine 0.60 - 1.10 mg/dL 0.91 0.79 0.98  Sodium 136 - 145 mmol/L 139 140 139  Potassium 3.5 - 5.1 mmol/L 3.9 4.0 3.9  Chloride 98 - 109 mmol/L 106 109 105  CO2 22 - 29 mmol/L 21(L) 25 20(L)  Calcium 8.4 - 10.4 mg/dL 9.6 9.6 9.7  Total Protein 6.4 - 8.3 g/dL 6.4 6.1(L) 6.5  Total Bilirubin 0.2 - 1.2 mg/dL 0.7 0.6 0.7  Alkaline Phos 40 - 150 U/L 55 48 51  AST 5 - 34 U/L '20 23 18  '$ ALT 0 - 55 U/L '20 16 17    '$ SPEP M-PROTEIN 10/01/2013: 0.18 (nadir)  10/13/2014: 0.6 11/21/2014: 0.5 01/30/2015: 0.6 03/13/2015: 0.6 04/20/2015: 0.5 06/01/2015: 0.5 07/20/2015: 0.7 09/14/2015: 0.7 10/26/2015: 0.8 12/07/2015: 1.0 02/07/2016: 1.1  04/03/2016: 0.2 05/03/2016: 0.2  05/31/2016: 0.1 06/28/2016: 0.1 07/25/2016: 0.1 08/21/2016: 0.1 09/27/2016: 0.1 10/23/16: 0.1 12/27/2016: 0.1 (Dara Specific  IFE was still positive) 01/24/17: 0.1 02/28/17: 0.1 03/28/17: 0.1 04/25/17: 0.1 05/23/17: 0.1 06/27/17: 0.1 08/01/17: 0.1 2:15/19: 0.1 09/26/17: 0.2  IgG [phone]) 10/13/2014: 1710 11/21/2014: 1460 01/30/2015: 1380 03/13/15: 1570 04/20/2015: 1550 06/01/2015: 1590 07/22/2015: 3151 09/14/2015: 1787 10/26/2015: 1898 12/07/2015: 2045 02/07/2016: 1883 04/03/2016: 787 05/03/2016: 685  05/31/2016: 544 06/28/2016: 463 07/25/2016: 455 08/21/2016: 426 09/27/2016: 438 10/23/16: 374 12/06/16: 370 12/27/16: 343, 346 01/24/17: 332  02/28/17: 362 03/28/17:  436 04/25/17: 431 05/23/17: 471 06/27/17: 429 08/01/17: 392 08/29/17: 409 09/26/17: 421  Serum kappa, lambda light chains, and ratio  11/21/2014: 3.95, 2.08, 1.90  01/30/2015: 3.92, 2.64, 1.48 03/13/2015: 3.55, 2.17, 1.64 04/20/15: 4.52, 1.78, 2.54 06/01/2015: 4.82, 1.75, 2.75 07/20/2015: 8.71, 2.43, 3.58 09/14/2015: 9.17, 2.02, 4.55 10/26/2015: 11.4, 1.75, 6.42 12/07/2015: 1.54, 1.83, 8.44 02/07/2016: 1.93, 1.42, 13.6 04/03/2016: 0.96, 0.95, 1.01  05/03/2016: 0.71, 0.34, 2.09  05/31/2016: 0.48, 0.26, 1.85 06/28/2016: 0.52, 0.60, 0.87 07/25/2016: 0.46, 0.27, 1.70 08/21/2016: 0.43, 0.27, 1.59 09/27/2016: 0.52, 0.24, 2.17 10/23/16: .0.49, 0.35, 1.40 12/06/16: 0.68, 0.29, 2.34 12/27/16: 0.54, 0.29, 1.86 02/28/17: 0.56, 0.30, 1.87 04/11/17: 0.61, 0.29, 2.10 05/09/17: 0.57, 0.33, 1.73 06/13/17: 0.65, 0.23, 2.83 07/18/17: 0.75 0.26, 2.83 08/15/17: 0.78, 0.27, 2.89 08/29/17: 0.79, 0.3, 2.63 09/26/17: 0.88, 0.26, 3.38   24 hr urine PEP (total protein, M-spike) 10/13/2014: (-) 02/12/2016: '265mg'$ , '68mg'$  04/05/2016: 114 mg, (-) 08/08/16: 99, (-) 12/27/16: '59mg'$ , (-) 03/28/17: '96mg'$ , (-)   PATHOLOGY REPORT  Diagnosis 01/24/2016 Bone Marrow, Aspirate,Biopsy, and Clot, RT iliac and core - VARIABLY CELLULAR BONE MARROW WITH PLASMA CELL NEOPLASM. - SEE COMMENT. PERIPHERAL BLOOD: - MACROCYTIC ANEMIA.  Diagnosis Note The bone marrow is variably cellular with trilineage  hematopoiesis and non specific myeloid changes. In this background, the plasma cell component is variable ranging from less than 10 % in many areas  to 30% in some aspirate particles with relatively low cellularity. Foci of interstitial infiltrates and small clusters are also seen in the clot section. Immunohistochemical stains show kappa light chain restriction in atypical plasma cell clusters and infiltrates consistent with plasma cell neoplasm. Correlation with cytogenetic and FISH studies is recommended. (BNS:gt, 02/17/2016)  Diagnosis 03/28/16 PLEURAL FLUID, LEFT (SPECIMEN 1 OF 1 COLLECTED 03/28/16): REACTIVE MESOTHELIAL CELLS  RADIOGRAPHIC STUDIES:  ECHO 03/12/17 Study Conclusions - Left ventricle: The cavity size was normal. Wall thickness was   increased in a pattern of mild LVH. Systolic function was normal.   The estimated ejection fraction was in the range of 50% to 55%.   There is hypokinesis of the basalinferior myocardium. Doppler   parameters are consistent with abnormal left ventricular   relaxation (grade 1 diastolic dysfunction). - Aortic valve: There was trivial regurgitation. - Mitral valve: Calcified annulus. Mildly thickened leaflets .   There was trivial regurgitation. - Tricuspid valve: There was mild regurgitation. Impressions: - There has been mild improvement in EF since prior study.    Echo 12/03/16 -Left Ventricle: Systolic function was   mildly to moderately reduced. The estimated ejection fraction was   in the range of 40% to 45%. Diffuse hypokinesis. Doppler  parameters are consistent with abnormal left ventricular relaxation (grade 1 diastolic dysfunction). Doppler parameters are consistent with indeterminate ventricular filling pressure. - Left atrium: The atrium was severely dilated. - Tricuspid valve: There was trivial regurgitation. - Pulmonary arteries: Systolic pressure was within the normal   range. PA peak pressure: 33 mm Hg (S). - Global  longitudinal strain -10.5% (abnormal)  Echo 08/26/2016 - Left ventricle: LVEF is approximately 35% with diffuse hypokinesis with severe hypokinesis/ akinesis of the inferior/inferoseptal walls. In comparison to echo images from December 2017, there does not appear to be significant change The cavity size was normal. Wall thickness was normal. Doppler parameters are consistent with abnormal left ventricular relaxation (grade 1 diastolic dysfunction). - Aortic valve: AV is thickened. 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 Present in echo from December 2017 There was trivial regurgitation. - Right ventricle: Systolic function was mildly reduced. - Right atrium: The atrium was mildly dilated. - Pericardium, extracardiac: A trivial pericardial effusion was identified.  ASSESSMENT: Shannon Rush 77 y.o. female with a history of Multiple Myeloma s/p autotransplant in August 2014 followed by maintenance low dose Revlimid therapy, and relapsed in 01/2016  PLAN:   1. IgG kappa Multiple myeloma s/p Auto SCT, intermediate risk, relapsed in 01/2016 --MM markers demonstrate a very good initial response (M protein baseline 2.6, down to 0.18 after induction chemo and transplant, stable around 0.5 since July 2015, but has gradually increased lately),  she unfortunately relapsed in July 2017 -I previously discussed her repeated bone marrow biopsy from 02/01/2016, which showed 10-30% plasma cells in her marrow -I agreed with Dr. Kendell Bane recommendation to stop Revlimid and change to Daratumumab, Pomalidomide and dexamethasone due to her disease relapse. I don't think she has resistance to Revlimid, she has been on very small dose of Revlimid due to her prior history of multiple infections. -She started first cycle DaraPD in 02/2016, but in 2 weeks she developed severe pancytopenia, pneumonia with sepsis required 3 weeks hospital stay. She did have a very  nice response to the treatment, her M-protein has significantly reduced from 1.2 to 0.2, light chain levels normalized even with half cycle treatment  -I recommended her to change regiment  to [place], and started her on dara every 2 weeks for 2 doses, she tolerated this very well -she has had good response to treatment, M-protein significantly dropped -Her previous echo in December 2017 showed a significant drop on EF, she is not much symptomatically for CHF, due to the potential contribution of Velcade to her CHF, I'll hold her Velcade for now. Her repeated echo in May 2018 showed a significant improvement, EF 40-45%.. -her dara-specific IFE in 12/2016 was still positive, she has low level M-protein, she has achieved a good partial response. -She hasn't had Velcade since December 2017. Will continue holding it for now due to her CHF -We previously discussed her ECHO from 03/12/17 that showed her heart function has normalized to EF of 50% to 55%.  -She has seen Dr. [name] at [facility] who suggests she use a combination treatment of Dara + low dose Pomalidomide with and increase treatment to every 2 weeks.  -I previously discussed the available data of Daratumumab as single agent or in combination with Pomalidomide or Velcade. Due to her previous severe cytopenia and infection, and a prolonged excellent response to single agent Dara, I did not recommend her to change to DaraPD again.  We decided to change Dara/dexa treatment to every 2 weeks on 03/28/17, she has been tolerating well overall. Her M-protein level has not stable (0.1-0.2) since then  -if she has disease progression, I would consider adding Velcade back -I again advised her to drink adequate water, take magnesium, continue calcium and vitamin D to help her muscle cramping -CBC reviewed, CBC and MM panel overall stable. Overall adequate to proceed with Dara infusion today. -I will test her HBV and HCV due to the risk of faire secondary to Dara   -She is clinically doing well, no significant side effects from treatment so far, will continue Dara/Dexa every 2 weeks  -F/u in 6 weeks, she knows to call with nay concerns or questions     2. Pancytopenia  -secondary to Dara, slightly worse after we increased Dara to every 2 weeks  -Continue monitoring closely. -She has mild anemia, overall stable -Blood counts stable except continued mild anemia   3. CHF, CAD, inferior STEMI in 02/2014 -continue metoprolol and lipitor, Dr. Meda Coffee previously started her on lisinopril also -previously followed up with Dr. Meda Coffee  -her previous echo on 08/26/16 showed improvement with EF 30-35% from 25-30%, further improved to 40-45%, echo in May 2018 -Dr. Meda Coffee plans to repeat her echo every 3 months when she is on treatment. -Her 03/12/17 ECHO shows improved heart function to normal range of EF at 50% to 55%. -I continued to encourage her to watch for leg swelling and SBO and chest pain as these are symptoms of her heart condition.   4. Osteoporosis -previous bone density scan showed worsening osteoporosis, continue Zometa, changed to every 3 months after 03/2015.  -she declined repeating DEXA  -she was on Zometa for 4 years, the risk is probably outweighs the benefit at this point, we have stopped it, last injection was 10/23/16  5. Peripheral neuropathy in feet, G1-2 -Secondary to previous chemotherapy. Slightly worse lately. -We previously discussed the role of Neurontin, she declined. -I previously suggested Vitamin B complex and continue practice of circulation of her feet. I advised her to be careful to not fall.  -Will monitor, she will let us know if this worsens.    6. Goal of care discussion  -We previously discussed the incurable nature of her cancer, and  the overall poor prognosis, especially if she does not have good response to chemotherapy or progress on chemo -The patient understands the goal of care is palliative. -she is full code  now    Plan -Lab, flush and dara today and every 2 weeks  -she will continue to take dexa '20mg'$  the day before Dara infusion  -F/u in 6 weeks   All questions were answered. The patient knows to call the clinic with any problems, questions or concerns. We can certainly see the patient much sooner if necessary.  I spent 20 minutes counseling the patient face to face. The total time spent in the appointment was 25 minutes.  This document serves as a record of services personally performed by [name], MD. It was created on her behalf by Theresia Bough, a trained medical scribe. The creation of this record is based on the scribe's personal observations and the provider's statements to them.   I have reviewed the above documentation for accuracy and completeness, and I agree with the above.    [name]  10/10/2017 4:35 PM

## 2017-10-10 ENCOUNTER — Inpatient Hospital Stay (HOSPITAL_BASED_OUTPATIENT_CLINIC_OR_DEPARTMENT_OTHER): Payer: Medicare Other | Admitting: Hematology

## 2017-10-10 ENCOUNTER — Inpatient Hospital Stay: Payer: Medicare Other

## 2017-10-10 ENCOUNTER — Encounter: Payer: Self-pay | Admitting: Hematology

## 2017-10-10 VITALS — BP 106/47 | HR 71 | Temp 97.7°F | Resp 16

## 2017-10-10 VITALS — BP 118/66 | HR 79 | Temp 97.7°F | Resp 18 | Ht 68.0 in | Wt 126.5 lb

## 2017-10-10 DIAGNOSIS — Z9481 Bone marrow transplant status: Secondary | ICD-10-CM

## 2017-10-10 DIAGNOSIS — C9000 Multiple myeloma not having achieved remission: Secondary | ICD-10-CM

## 2017-10-10 DIAGNOSIS — D61811 Other drug-induced pancytopenia: Secondary | ICD-10-CM | POA: Diagnosis not present

## 2017-10-10 DIAGNOSIS — I252 Old myocardial infarction: Secondary | ICD-10-CM | POA: Diagnosis not present

## 2017-10-10 DIAGNOSIS — Z9225 Personal history of immunosupression therapy: Secondary | ICD-10-CM

## 2017-10-10 DIAGNOSIS — Z5112 Encounter for antineoplastic immunotherapy: Secondary | ICD-10-CM | POA: Diagnosis not present

## 2017-10-10 DIAGNOSIS — G620 Drug-induced polyneuropathy: Secondary | ICD-10-CM | POA: Diagnosis not present

## 2017-10-10 DIAGNOSIS — C9002 Multiple myeloma in relapse: Secondary | ICD-10-CM

## 2017-10-10 DIAGNOSIS — M818 Other osteoporosis without current pathological fracture: Secondary | ICD-10-CM

## 2017-10-10 DIAGNOSIS — Z9221 Personal history of antineoplastic chemotherapy: Secondary | ICD-10-CM | POA: Diagnosis not present

## 2017-10-10 DIAGNOSIS — Z79899 Other long term (current) drug therapy: Secondary | ICD-10-CM

## 2017-10-10 DIAGNOSIS — M810 Age-related osteoporosis without current pathological fracture: Secondary | ICD-10-CM

## 2017-10-10 DIAGNOSIS — T451X5S Adverse effect of antineoplastic and immunosuppressive drugs, sequela: Secondary | ICD-10-CM

## 2017-10-10 DIAGNOSIS — Z8701 Personal history of pneumonia (recurrent): Secondary | ICD-10-CM

## 2017-10-10 DIAGNOSIS — I2510 Atherosclerotic heart disease of native coronary artery without angina pectoris: Secondary | ICD-10-CM | POA: Diagnosis not present

## 2017-10-10 DIAGNOSIS — I509 Heart failure, unspecified: Secondary | ICD-10-CM

## 2017-10-10 DIAGNOSIS — C9001 Multiple myeloma in remission: Secondary | ICD-10-CM

## 2017-10-10 LAB — CBC WITH DIFFERENTIAL (CANCER CENTER ONLY)
BASOS PCT: 0 %
Basophils Absolute: 0 10*3/uL (ref 0.0–0.1)
EOS ABS: 0 10*3/uL (ref 0.0–0.5)
Eosinophils Relative: 0 %
HCT: 33.5 % — ABNORMAL LOW (ref 34.8–46.6)
HEMOGLOBIN: 11.1 g/dL — AB (ref 11.6–15.9)
LYMPHS ABS: 0.3 10*3/uL — AB (ref 0.9–3.3)
Lymphocytes Relative: 4 %
MCH: 35 pg — AB (ref 25.1–34.0)
MCHC: 33.1 g/dL (ref 31.5–36.0)
MCV: 105.7 fL — ABNORMAL HIGH (ref 79.5–101.0)
MONO ABS: 0.1 10*3/uL (ref 0.1–0.9)
MONOS PCT: 2 %
Neutro Abs: 5.3 10*3/uL (ref 1.5–6.5)
Neutrophils Relative %: 94 %
Platelet Count: 198 10*3/uL (ref 145–400)
RBC: 3.17 MIL/uL — ABNORMAL LOW (ref 3.70–5.45)
RDW: 14.8 % — AB (ref 11.2–14.5)
WBC Count: 5.7 10*3/uL (ref 3.9–10.3)

## 2017-10-10 LAB — COMPREHENSIVE METABOLIC PANEL
ALBUMIN: 3.9 g/dL (ref 3.5–5.0)
ALK PHOS: 55 U/L (ref 40–150)
ALT: 20 U/L (ref 0–55)
ANION GAP: 12 — AB (ref 3–11)
AST: 20 U/L (ref 5–34)
BUN: 22 mg/dL (ref 7–26)
CALCIUM: 9.6 mg/dL (ref 8.4–10.4)
CO2: 21 mmol/L — AB (ref 22–29)
Chloride: 106 mmol/L (ref 98–109)
Creatinine, Ser: 0.91 mg/dL (ref 0.60–1.10)
GFR calc non Af Amer: >60 mL/min (ref >60)
GLUCOSE: 145 mg/dL — AB (ref 70–140)
POTASSIUM: 3.9 mmol/L (ref 3.5–5.1)
SODIUM: 139 mmol/L (ref 136–145)
TOTAL PROTEIN: 6.4 g/dL (ref 6.4–8.3)
Total Bilirubin: 0.7 mg/dL (ref 0.2–1.2)

## 2017-10-10 MED ORDER — METHYLPREDNISOLONE SODIUM SUCC 125 MG IJ SOLR
125.0000 mg | Freq: Once | INTRAMUSCULAR | Status: AC
  Administered 2017-10-10: 125 mg via INTRAVENOUS

## 2017-10-10 MED ORDER — METHYLPREDNISOLONE SODIUM SUCC 125 MG IJ SOLR
INTRAMUSCULAR | Status: AC
  Filled 2017-10-10: qty 2

## 2017-10-10 MED ORDER — ACETAMINOPHEN 325 MG PO TABS
650.0000 mg | ORAL_TABLET | Freq: Once | ORAL | Status: AC
  Administered 2017-10-10: 650 mg via ORAL

## 2017-10-10 MED ORDER — DIPHENHYDRAMINE HCL 25 MG PO CAPS
50.0000 mg | ORAL_CAPSULE | Freq: Once | ORAL | Status: AC
  Administered 2017-10-10: 50 mg via ORAL

## 2017-10-10 MED ORDER — MONTELUKAST SODIUM 10 MG PO TABS
ORAL_TABLET | ORAL | Status: AC
  Filled 2017-10-10: qty 1

## 2017-10-10 MED ORDER — ACETAMINOPHEN 325 MG PO TABS
ORAL_TABLET | ORAL | Status: AC
  Filled 2017-10-10: qty 2

## 2017-10-10 MED ORDER — SODIUM CHLORIDE 0.9 % IV SOLN
16.0000 mg/kg | Freq: Once | INTRAVENOUS | Status: AC
  Administered 2017-10-10: 900 mg via INTRAVENOUS
  Filled 2017-10-10: qty 40

## 2017-10-10 MED ORDER — MONTELUKAST SODIUM 10 MG PO TABS
10.0000 mg | ORAL_TABLET | Freq: Once | ORAL | Status: AC
  Administered 2017-10-10: 10 mg via ORAL

## 2017-10-10 MED ORDER — SODIUM CHLORIDE 0.9 % IV SOLN
Freq: Once | INTRAVENOUS | Status: AC
  Administered 2017-10-10: 12:00:00 via INTRAVENOUS

## 2017-10-10 MED ORDER — DIPHENHYDRAMINE HCL 25 MG PO CAPS
ORAL_CAPSULE | ORAL | Status: AC
  Filled 2017-10-10: qty 2

## 2017-10-10 NOTE — Patient Instructions (Signed)
[facility] Discharge Instructions for Patients Receiving Chemotherapy  Today you received the following chemotherapy agents: Darzalex  To help prevent nausea and vomiting after your treatment, we encourage you to take your nausea medication as directed.    If you develop nausea and vomiting that is not controlled by your nausea medication, call the clinic.   BELOW ARE SYMPTOMS THAT SHOULD BE REPORTED IMMEDIATELY:  *FEVER GREATER THAN 100.5 F  *CHILLS WITH OR WITHOUT FEVER  NAUSEA AND VOMITING THAT IS NOT CONTROLLED WITH YOUR NAUSEA MEDICATION  *UNUSUAL SHORTNESS OF BREATH  *UNUSUAL BRUISING OR BLEEDING  TENDERNESS IN MOUTH AND THROAT WITH OR WITHOUT PRESENCE OF ULCERS  *URINARY PROBLEMS  *BOWEL PROBLEMS  UNUSUAL RASH Items with * indicate a potential emergency and should be followed up as soon as possible.  Feel free to call the clinic should you have any questions or concerns. The clinic phone number is [phone].  Please show the CHEMO ALERT CARD at check-in to the Emergency Department and triage nurse.   

## 2017-10-15 DIAGNOSIS — Z85828 Personal history of other malignant neoplasm of skin: Secondary | ICD-10-CM | POA: Diagnosis not present

## 2017-10-15 DIAGNOSIS — L570 Actinic keratosis: Secondary | ICD-10-CM | POA: Diagnosis not present

## 2017-10-24 ENCOUNTER — Inpatient Hospital Stay: Payer: Medicare Other

## 2017-10-24 ENCOUNTER — Inpatient Hospital Stay: Payer: Medicare Other | Attending: Hematology

## 2017-10-24 VITALS — BP 127/70 | HR 64 | Temp 98.7°F | Resp 18

## 2017-10-24 DIAGNOSIS — C9002 Multiple myeloma in relapse: Secondary | ICD-10-CM | POA: Diagnosis not present

## 2017-10-24 DIAGNOSIS — C9001 Multiple myeloma in remission: Secondary | ICD-10-CM

## 2017-10-24 DIAGNOSIS — C9000 Multiple myeloma not having achieved remission: Secondary | ICD-10-CM

## 2017-10-24 DIAGNOSIS — M810 Age-related osteoporosis without current pathological fracture: Secondary | ICD-10-CM

## 2017-10-24 DIAGNOSIS — I252 Old myocardial infarction: Secondary | ICD-10-CM

## 2017-10-24 DIAGNOSIS — Z5112 Encounter for antineoplastic immunotherapy: Secondary | ICD-10-CM | POA: Insufficient documentation

## 2017-10-24 DIAGNOSIS — D61818 Other pancytopenia: Secondary | ICD-10-CM

## 2017-10-24 LAB — COMPREHENSIVE METABOLIC PANEL
ALK PHOS: 47 U/L (ref 40–150)
ALT: 18 U/L (ref 0–55)
ANION GAP: 11 (ref 3–11)
AST: 19 U/L (ref 5–34)
Albumin: 3.8 g/dL (ref 3.5–5.0)
BILIRUBIN TOTAL: 0.6 mg/dL (ref 0.2–1.2)
BUN: 20 mg/dL (ref 7–26)
CALCIUM: 9.5 mg/dL (ref 8.4–10.4)
CO2: 21 mmol/L — ABNORMAL LOW (ref 22–29)
Chloride: 107 mmol/L (ref 98–109)
Creatinine, Ser: 1 mg/dL (ref 0.60–1.10)
GFR calc non Af Amer: 54 mL/min — ABNORMAL LOW (ref >60)
Glucose, Bld: 170 mg/dL — ABNORMAL HIGH (ref 70–140)
Potassium: 4.2 mmol/L (ref 3.5–5.1)
Sodium: 139 mmol/L (ref 136–145)
TOTAL PROTEIN: 6.1 g/dL — AB (ref 6.4–8.3)

## 2017-10-24 LAB — CBC WITH DIFFERENTIAL (CANCER CENTER ONLY)
BASOS ABS: 0 10*3/uL (ref 0.0–0.1)
Basophils Relative: 0 %
EOS ABS: 0 10*3/uL (ref 0.0–0.5)
Eosinophils Relative: 0 %
HCT: 32.2 % — ABNORMAL LOW (ref 34.8–46.6)
Hemoglobin: 11 g/dL — ABNORMAL LOW (ref 11.6–15.9)
LYMPHS ABS: 0.2 10*3/uL — AB (ref 0.9–3.3)
Lymphocytes Relative: 7 %
MCH: 35.9 pg — AB (ref 25.1–34.0)
MCHC: 34.2 g/dL (ref 31.5–36.0)
MCV: 105.2 fL — ABNORMAL HIGH (ref 79.5–101.0)
MONO ABS: 0 10*3/uL — AB (ref 0.1–0.9)
Monocytes Relative: 1 %
Neutro Abs: 3.1 10*3/uL (ref 1.5–6.5)
Neutrophils Relative %: 92 %
PLATELETS: 152 10*3/uL (ref 145–400)
RBC: 3.06 MIL/uL — AB (ref 3.70–5.45)
RDW: 14.7 % — AB (ref 11.2–14.5)
WBC: 3.3 10*3/uL — AB (ref 3.9–10.3)

## 2017-10-24 MED ORDER — ACETAMINOPHEN 325 MG PO TABS
ORAL_TABLET | ORAL | Status: AC
  Filled 2017-10-24: qty 2

## 2017-10-24 MED ORDER — DIPHENHYDRAMINE HCL 25 MG PO CAPS
50.0000 mg | ORAL_CAPSULE | Freq: Once | ORAL | Status: AC
  Administered 2017-10-24: 50 mg via ORAL

## 2017-10-24 MED ORDER — ACETAMINOPHEN 325 MG PO TABS
650.0000 mg | ORAL_TABLET | Freq: Once | ORAL | Status: AC
  Administered 2017-10-24: 650 mg via ORAL

## 2017-10-24 MED ORDER — SODIUM CHLORIDE 0.9 % IV SOLN
16.0000 mg/kg | Freq: Once | INTRAVENOUS | Status: AC
  Administered 2017-10-24: 900 mg via INTRAVENOUS
  Filled 2017-10-24: qty 5

## 2017-10-24 MED ORDER — METHYLPREDNISOLONE SODIUM SUCC 125 MG IJ SOLR
125.0000 mg | Freq: Once | INTRAMUSCULAR | Status: AC
  Administered 2017-10-24: 125 mg via INTRAVENOUS

## 2017-10-24 MED ORDER — METHYLPREDNISOLONE SODIUM SUCC 125 MG IJ SOLR
INTRAMUSCULAR | Status: AC
  Filled 2017-10-24: qty 2

## 2017-10-24 MED ORDER — SODIUM CHLORIDE 0.9 % IV SOLN
Freq: Once | INTRAVENOUS | Status: AC
  Administered 2017-10-24: 11:00:00 via INTRAVENOUS

## 2017-10-24 MED ORDER — MONTELUKAST SODIUM 10 MG PO TABS
10.0000 mg | ORAL_TABLET | Freq: Once | ORAL | Status: AC
  Administered 2017-10-24: 10 mg via ORAL

## 2017-10-24 MED ORDER — MONTELUKAST SODIUM 10 MG PO TABS
ORAL_TABLET | ORAL | Status: AC
  Filled 2017-10-24: qty 1

## 2017-10-24 MED ORDER — DIPHENHYDRAMINE HCL 25 MG PO CAPS
ORAL_CAPSULE | ORAL | Status: AC
  Filled 2017-10-24: qty 2

## 2017-10-25 LAB — HEPATITIS B SURFACE ANTIGEN: HEP B S AG: NEGATIVE

## 2017-10-25 LAB — HEPATITIS C ANTIBODY

## 2017-10-25 LAB — HEPATITIS B SURFACE ANTIBODY,QUALITATIVE: Hep B S Ab: NONREACTIVE

## 2017-10-27 LAB — PROTEIN ELECTROPHORESIS, SERUM
A/G RATIO SPE: 1.9 — AB (ref 0.7–1.7)
ALBUMIN ELP: 3.8 g/dL (ref 2.9–4.4)
Alpha-1-Globulin: 0.2 g/dL (ref 0.0–0.4)
Alpha-2-Globulin: 0.6 g/dL (ref 0.4–1.0)
Beta Globulin: 0.8 g/dL (ref 0.7–1.3)
GAMMA GLOBULIN: 0.5 g/dL (ref 0.4–1.8)
Globulin, Total: 2 g/dL — ABNORMAL LOW (ref 2.2–3.9)
M-Spike, %: 0.1 g/dL — ABNORMAL HIGH
TOTAL PROTEIN ELP: 5.8 g/dL — AB (ref 6.0–8.5)

## 2017-10-27 LAB — KAPPA/LAMBDA LIGHT CHAINS
KAPPA FREE LGHT CHN: 9.5 mg/L (ref 3.3–19.4)
Kappa, lambda light chain ratio: 2.64 — ABNORMAL HIGH (ref 0.26–1.65)
LAMDA FREE LIGHT CHAINS: 3.6 mg/L — AB (ref 5.7–26.3)

## 2017-10-28 LAB — IFE, DARA-SPECIFIC, SERUM
IGA: 5 mg/dL — AB (ref 64–422)
IGM (IMMUNOGLOBULIN M), SRM: 13 mg/dL — AB (ref 26–217)
IgG (Immunoglobin G), Serum: 438 mg/dL — ABNORMAL LOW (ref 700–1600)

## 2017-11-06 ENCOUNTER — Encounter: Payer: Self-pay | Admitting: Cardiology

## 2017-11-07 ENCOUNTER — Inpatient Hospital Stay: Payer: Medicare Other

## 2017-11-07 VITALS — BP 113/63 | HR 58 | Temp 97.7°F | Resp 16

## 2017-11-07 DIAGNOSIS — C9002 Multiple myeloma in relapse: Secondary | ICD-10-CM

## 2017-11-07 DIAGNOSIS — C9001 Multiple myeloma in remission: Secondary | ICD-10-CM

## 2017-11-07 DIAGNOSIS — Z5112 Encounter for antineoplastic immunotherapy: Secondary | ICD-10-CM | POA: Diagnosis not present

## 2017-11-07 DIAGNOSIS — C9000 Multiple myeloma not having achieved remission: Secondary | ICD-10-CM

## 2017-11-07 LAB — CBC WITH DIFFERENTIAL (CANCER CENTER ONLY)
BASOS ABS: 0 10*3/uL (ref 0.0–0.1)
Basophils Relative: 0 %
EOS PCT: 0 %
Eosinophils Absolute: 0 10*3/uL (ref 0.0–0.5)
HEMATOCRIT: 32.3 % — AB (ref 34.8–46.6)
Hemoglobin: 11 g/dL — ABNORMAL LOW (ref 11.6–15.9)
LYMPHS PCT: 4 %
Lymphs Abs: 0.1 10*3/uL — ABNORMAL LOW (ref 0.9–3.3)
MCH: 36.1 pg — AB (ref 25.1–34.0)
MCHC: 34 g/dL (ref 31.5–36.0)
MCV: 105.9 fL — AB (ref 79.5–101.0)
MONO ABS: 0 10*3/uL — AB (ref 0.1–0.9)
MONOS PCT: 2 %
Neutro Abs: 3.2 10*3/uL (ref 1.5–6.5)
Neutrophils Relative %: 94 %
PLATELETS: 162 10*3/uL (ref 145–400)
RBC: 3.05 MIL/uL — ABNORMAL LOW (ref 3.70–5.45)
RDW: 15.2 % — AB (ref 11.2–14.5)
WBC Count: 3.4 10*3/uL — ABNORMAL LOW (ref 3.9–10.3)

## 2017-11-07 LAB — COMPREHENSIVE METABOLIC PANEL
ALT: 14 U/L (ref 0–55)
ANION GAP: 12 — AB (ref 3–11)
AST: 19 U/L (ref 5–34)
Albumin: 4 g/dL (ref 3.5–5.0)
Alkaline Phosphatase: 45 U/L (ref 40–150)
BILIRUBIN TOTAL: 0.7 mg/dL (ref 0.2–1.2)
BUN: 21 mg/dL (ref 7–26)
CHLORIDE: 106 mmol/L (ref 98–109)
CO2: 20 mmol/L — ABNORMAL LOW (ref 22–29)
Calcium: 9.6 mg/dL (ref 8.4–10.4)
Creatinine, Ser: 0.96 mg/dL (ref 0.60–1.10)
GFR calc non Af Amer: 56 mL/min — ABNORMAL LOW (ref >60)
Glucose, Bld: 194 mg/dL — ABNORMAL HIGH (ref 70–140)
Potassium: 4.1 mmol/L (ref 3.5–5.1)
Sodium: 138 mmol/L (ref 136–145)
TOTAL PROTEIN: 6.3 g/dL — AB (ref 6.4–8.3)

## 2017-11-07 MED ORDER — SODIUM CHLORIDE 0.9 % IV SOLN
Freq: Once | INTRAVENOUS | Status: AC
  Administered 2017-11-07: 11:00:00 via INTRAVENOUS

## 2017-11-07 MED ORDER — DIPHENHYDRAMINE HCL 25 MG PO CAPS
ORAL_CAPSULE | ORAL | Status: AC
  Filled 2017-11-07: qty 2

## 2017-11-07 MED ORDER — METHYLPREDNISOLONE SODIUM SUCC 125 MG IJ SOLR
125.0000 mg | Freq: Once | INTRAMUSCULAR | Status: AC
  Administered 2017-11-07: 125 mg via INTRAVENOUS

## 2017-11-07 MED ORDER — MONTELUKAST SODIUM 10 MG PO TABS
ORAL_TABLET | ORAL | Status: AC
  Filled 2017-11-07: qty 1

## 2017-11-07 MED ORDER — METHYLPREDNISOLONE SODIUM SUCC 125 MG IJ SOLR
INTRAMUSCULAR | Status: AC
  Filled 2017-11-07: qty 2

## 2017-11-07 MED ORDER — ACETAMINOPHEN 325 MG PO TABS
650.0000 mg | ORAL_TABLET | Freq: Once | ORAL | Status: AC
  Administered 2017-11-07: 650 mg via ORAL

## 2017-11-07 MED ORDER — DIPHENHYDRAMINE HCL 25 MG PO CAPS
50.0000 mg | ORAL_CAPSULE | Freq: Once | ORAL | Status: AC
  Administered 2017-11-07: 50 mg via ORAL

## 2017-11-07 MED ORDER — ACETAMINOPHEN 325 MG PO TABS
ORAL_TABLET | ORAL | Status: AC
Start: 2017-11-07 — End: 2017-11-07
  Filled 2017-11-07: qty 2

## 2017-11-07 MED ORDER — MONTELUKAST SODIUM 10 MG PO TABS
10.0000 mg | ORAL_TABLET | Freq: Once | ORAL | Status: AC
  Administered 2017-11-07: 10 mg via ORAL

## 2017-11-07 MED ORDER — SODIUM CHLORIDE 0.9 % IV SOLN
16.0000 mg/kg | Freq: Once | INTRAVENOUS | Status: AC
  Administered 2017-11-07: 900 mg via INTRAVENOUS
  Filled 2017-11-07: qty 40

## 2017-11-07 NOTE — Patient Instructions (Signed)
[facility] Discharge Instructions for Patients Receiving Chemotherapy  Today you received the following chemotherapy agents darzalex  To help prevent nausea and vomiting after your treatment, we encourage you to take your nausea medication as directed  If you develop nausea and vomiting that is not controlled by your nausea medication, call the clinic.   BELOW ARE SYMPTOMS THAT SHOULD BE REPORTED IMMEDIATELY:  *FEVER GREATER THAN 100.5 F  *CHILLS WITH OR WITHOUT FEVER  NAUSEA AND VOMITING THAT IS NOT CONTROLLED WITH YOUR NAUSEA MEDICATION  *UNUSUAL SHORTNESS OF BREATH  *UNUSUAL BRUISING OR BLEEDING  TENDERNESS IN MOUTH AND THROAT WITH OR WITHOUT PRESENCE OF ULCERS  *URINARY PROBLEMS  *BOWEL PROBLEMS  UNUSUAL RASH Items with * indicate a potential emergency and should be followed up as soon as possible.  Feel free to call the clinic you have any questions or concerns. The clinic phone number is [phone].  

## 2017-11-18 NOTE — Progress Notes (Signed)
[name] ONCOLOGY OFFICE PROGRESS NOTE  PCP: [name], MD [place] [address]  DIAGNOSIS: Multiple myeloma with relapse  CHIEF COMPLAINTS: Follow-up of multiple myeloma  Oncology History   Multiple myeloma ([name])   Staging form: Multiple Myeloma, AJCC 6th Edition   - Clinical: Stage IIIA - Signed by [name], MD on 09/04/2013      Multiple myeloma ([name])   05/26/2012 Initial Diagnosis    Presenting IgG was 4,040 mg/dL on 06/05/2012 (IgA 35; IgM 34); kappa free light chain 34.4 mg/dL, lambda 0.00, kappa:lambda ratio of 34.75; SPEP with M-spike of 2.60.      06/30/2012 Imaging    Numerous lytic lesions throughout the calvarium.  Lytic lesion within the left lateral scapula.  Findings most compatible with metastases or myeloma. Multiple mid and lower thoracic compression fractures.  Slight compression through the endplates at L3.      85/88/5027 Bone Marrow Biopsy    Bone marrow biopsy showed 49% plasma cell. Normal classical cytogenetics; however, myeloma FISH panel showed 13q- (intermediate risk)          07/14/2012 - 07/27/2012 Radiation Therapy    Palliative radiation 20 Gy over 10 fractions between to thoracic spine cord compression and symptomatic left scapula lytic lesion.      08/10/2012 - 11/2012 Chemotherapy    Started SQ Velcade once weekly, 3 weeks on, 1 weeks off; daily Revlimid d1-21, 7 days off; and Dexamethasone 76m PO weekly.       02/12/2013 Bone Marrow Transplant    [facility]       06/14/2013 Tumor Marker    IgG  1830,  Kappa:lamba ratio, 1.69 (baseline was 34.75 or   M-spike 0.28 (baseline of 2.6 or  89.3% of baseline).        06/25/2013 -  Chemotherapy    Started zometa 3.5 mg monthly       09/01/2013 - 01/2014 Chemotherapy    Maintenance therapy with revlimid 581mdaily for 14 days and then off for 7 (decreased from 21 days on and 7 days off based on neutropenia). Stopped due  to disease progression 01/2017      09/13/2013 - 09/17/2014 Hospital Admission    Hospital admission for pneumonia      12/20/2013 Treatment Plan Change    Maintenance therapy decreased to 2.5 mg daily for 21 days and then off for 7 days based on low counts.       01/25/2014 Tumor Marker    IgG 1360 M spike 0.5      03/01/2014 - 03/05/2014 Hospital Ad[facility], received thrombolytic therapy, cath showed 60% prox RCA, mid-distal RCA 50%, Echo normal, no stents.      04/11/2014 - 08/07/2014 Chemotherapy    Continue Revlimid at 2.5 mg 3 weeks on 1 week off      08/08/2014 - 08/13/2014 Hospital Admission    Hospital admission for pneumonia. Her Revlimid was held.      08/29/2014 - 09/14/2014 Chemotherapy    Maintenance Revlimid restarted      09/14/2014 - 09/17/2014 Hospital Admission    [place]      11/13/2014 - 12/25/2015 Chemotherapy    Maintenance Revlimid restarted, changed to 2.64m77maily, 2 weks on, 1 week off from 12/15/2014, stopped due to disease progression  01/24/2016 Progression    Patient is M protein has gradually increased to 1.0g, repeated a bone marrow biopsy showed plasma cell 10-30%      02/27/2016 - 03/07/2016 Chemotherapy    Pomalidomide 4 mg daily on day 1-21, dexamethasone 40 mg on day 1, 8 and 15, every 28 days, started on 02/27/2016, daratumumab weekly started on 03/07/2016, held after first dose, due to hospitalization and a severe pancytopenia, pomalidomide was stopped afterwards       03/10/2016 - 03/29/2016 Hospital Admission    Pt was admitted for sepsis from pneumonia, and severe pancytopenia. She required ICU stay for a few days due to hypotension, developed b/l pleural effusion required diuretics and b/l thoracentesis. She also required blood and plt transfusion, and prolonged neupogen injection for severe  neutropenia. She was discharged home after 3 weeks hospital stay       04/26/2016 -  Chemotherapy    Daratumumab restarted on 04/26/2016, Velcade 1.44m/m2 and dexa 211mweekly start on 11/3, velcade held since 06/28/2016 due to CHF       08/26/2016 Echocardiogram    - Left ventricle: LVEF is approximately 35% with diffuse diffuse   hypokinesis with severe hypokinesis/ akinesis of the   inferior/inferoseptal walls. In comparison to echo images from   December 2017, there does not appear to be significant change The   cavity size was normal. Wall thickness was normal. Doppler   parameters are consistent with abnormal left ventricular   relaxation (grade 1 diastolic dysfunction). - Aortic valve: AV is thickened. 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 Present in echo from December 2017 There was   trivial regurgitation. - Right ventricle: Systolic function was mildly reduced. - Right atrium: The atrium was mildly dilated. - Pericardium, extracardiac: A trivial pericardial effusion was   identified.      12/03/2016 Echocardiogram    -Left Ventricle: Systolic function was   mildly to moderately reduced. The estimated ejection fraction was   in the range of 40% to 45%. Diffuse hypokinesis. Doppler   parameters are consistent with abnormal left ventricular   relaxation (grade 1 diastolic dysfunction). Doppler parameters   are consistent with indeterminate ventricular filling pressure. - Left atrium: The atrium was severely dilated. - Tricuspid valve: There was trivial regurgitation. - Pulmonary arteries: Systolic pressure was within the normal   range. PA peak pressure: 33 mm Hg (S). - Global longitudinal strain -10.5% (abnormal)      03/12/2017 Echocardiogram    ECHO 03/12/17 Study Conclusions - Left ventricle: The cavity size was normal. Wall thickness was   increased in a pattern of mild LVH. Systolic function was  normal.   The estimated ejection fraction was in the range of 50% to 55%.   There is hypokinesis of the basalinferior myocardium. Doppler   parameters are consistent with abnormal left ventricular   relaxation (grade 1 diastolic dysfunction). - Aortic valve: There was trivial regurgitation. - Mitral valve: Calcified annulus. Mildly thickened leaflets .   There was trivial regurgitation. - Tricuspid valve: There was mild regurgitation. Impressions: - There has been mild improvement in EF since prior study.         CURRENT TREATMENT:   1. Pomalidomide 4 mg daily on day 1-21, dexamethasone 40 mg on day 1, 8 and 15, every 28 days, started on 02/27/2016, daratumumab weekly started on 03/07/2016, treatment on hold since 03/09/2016 due to hospitalization and severe cytopenia 2. Daratumumab restarted on  04/26/2016, Velcade 1.'3mg'$ /m2 and dexa '20mg'$  weekly start on 11/3, velcade held since 06/28/2016 due to CHF. Tonette Bihari was changed from every 4 weeks to every 2 weeks on 03/28/2017 per Dr. Kendell Bane recommendation.    INTERVAL HISTORY:  MARIYAM REMINGTON 77 y.o. female  here for a follow up and Daratumumab infusion. She presents to the clinic by herself today. She notes that she is doing well at this time.   She notes that on last week she had an increase in fatigue and joint pain due to her increase in activity last week. 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   She notes that she is going out of town for 2 weeks on next week. She will be going on a river cruise in [place] with her husband, Fritz Pickerel. She will leave 5/16 and return on 5/30.   On review of systems, she reports numbness/tingling in the bilateral feet and legs. Pertinent positives are listed and detailed within the above HPI.    MEDICAL HISTORY: Past Medical History:  Diagnosis Date  . CAD (coronary artery disease)    a. inf STEMI (in [place] >>> [facility]) >>> LHC (8/15- [place]):  pRCA 60%, mid to dist RCA 50% >>> med rx  . Cord compression ([place]) 07/13/12   MRI- diffuse myeloma involvement of T-L spine  . History of radiation therapy 07/13/12-07/27/12   spinal cord compression T3-T10,left scapula  . Hx of echocardiogram    Echo (03/02/14-done at [facility]):  Normal LVEF without significant regional wall motion abnormalities. Mild   . Multiple myeloma ([place]) 07/01/2012  . OSTEOPOROSIS 06/11/2010   Multiple compression fractures; and spontaneous fracture of sternum Qualifier: Diagnosis of  By: [name], [name]     . Thoracic kyphosis 07/13/12   per MRI scan  . Unspecified deficiency anemia    ALLERGIES:  is allergic to zosyn [piperacillin sod-tazobactam so]; levaquin [levofloxacin in d5w]; other; piperacillin-tazobactam in dex; zithromax [azithromycin]; and ciprofloxacin.  SURGICAL HISTORY:  Past Surgical History:  Procedure Laterality Date  . APPENDECTOMY    . CESAREAN SECTION     x2   . COLONOSCOPY  2007   neg with Dr. [name]  . ELBOW SURGERY    . HIP SURGERY  2009   left  . TUBAL LIGATION     REVIEW OF SYSTEMS:   Constitutional: Denies abnormal weight loss Eyes: Denies blurriness of vision  Ears, nose, mouth, throat, and face: Denies mucositis or sore throat,  Respiratory: Denies dyspnea or wheezes Cardiovascular: Denies palpitation, chest discomfort  Gastrointestinal:  Denies nausea, heartburn or change in bowel habits Skin: Denies abnormal skin rashes  Lymphatics: Denies new lymphadenopathy or easy bruising MSK: (+) muscle cramping in lower legs, hands and diaphragm  Neurological:Denies new weaknesses. Positive for paraesthesias and numbness to the feet (baseline). (+) neuropathy of her feet  Behavioral/Psych: Mood is stable, no new changes  All other systems were reviewed with the patient and are negative.   PHYSICAL EXAMINATION:  ECOG PERFORMANCE STATUS: 1 BP 122/72 (BP Location: Right Arm, Patient  Position: Sitting)   Pulse 69   Temp 97.8 F (36.6 C) (Oral)   Resp 18   Ht '5\' 8"'$  (1.727 m)   Wt 123 lb 6.4 oz (56 kg)   SpO2 100%   BMI 18.76 kg/m   GENERAL:alert, no distress and comfortable; easily mobile to exam table; kyphosis, moderate.  SKIN: skin color, texture,  turgor are normal, no rashes or significant lesions except a small healing, dry wound in the right lower leg above his ankle, with surrounding skin erythema and swelling EYES: normal, Conjunctiva are pink, 1 tear/injection blood vessel left eye in sclera. OROPHARYNX:no exudate, no erythema and lips, buccal mucosa, and tongue normal, no ONJ NECK: supple, thyroid normal size, non-tender, without nodularity; nodules cervical bilaterally.  LYMPH:  no palpable lymphadenopathy in the cervical, axillary or supraclavicular LUNGS: crackles at the bases bilaterally with normal breathing effort, no wheezes or rhonchi HEART: regular rate & rhythm and no murmurs  ABDOMEN:abdomen soft, and normal bowel sounds Musculoskeletal:no cyanosis of digits and no clubbing, NEURO: alert & oriented x 3 with fluent speech, no focal motor/sensory deficits   Labs:  CBC Latest Ref Rng & Units 11/21/2017 11/07/2017 10/24/2017  WBC 3.9 - 10.3 K/uL 3.5(L) 3.4(L) 3.3(L)  Hemoglobin 11.6 - 15.9 g/dL 11.6 11.0(L) 11.0(L)  Hematocrit 34.8 - 46.6 % 33.5(L) 32.3(L) 32.2(L)  Platelets 145 - 400 K/uL 174 162 152    CMP Latest Ref Rng & Units 11/21/2017 11/07/2017 10/24/2017  Glucose 70 - 140 mg/dL 162(H) 194(H) 170(H)  BUN 7 - 26 mg/dL '23 21 20  '$ Creatinine 0.60 - 1.10 mg/dL 0.96 0.96 1.00  Sodium 136 - 145 mmol/L 137 138 139  Potassium 3.5 - 5.1 mmol/L 4.0 4.1 4.2  Chloride 98 - 109 mmol/L 106 106 107  CO2 22 - 29 mmol/L 21(L) 20(L) 21(L)  Calcium 8.4 - 10.4 mg/dL 9.6 9.6 9.5  Total Protein 6.4 - 8.3 g/dL 6.4 6.3(L) 6.1(L)  Total Bilirubin 0.2 - 1.2 mg/dL 0.6 0.7 0.6  Alkaline Phos 40 - 150 U/L 48 45 47  AST 5 - 34 U/L '19 19 19  '$ ALT 0 - 55 U/L '18 14 18     '$ SPEP M-PROTEIN 10/01/2013: 0.18 (nadir)  10/13/2014: 0.6 11/21/2014: 0.5 01/30/2015: 0.6 03/13/2015: 0.6 04/20/2015: 0.5 06/01/2015: 0.5 07/20/2015: 0.7 09/14/2015: 0.7 10/26/2015: 0.8 12/07/2015: 1.0 02/07/2016: 1.1  04/03/2016: 0.2 05/03/2016: 0.2  05/31/2016: 0.1 06/28/2016: 0.1 07/25/2016: 0.1 08/21/2016: 0.1 09/27/2016: 0.1 10/23/16: 0.1 12/27/2016: 0.1 (Dara Specific IFE was still positive) 01/24/17: 0.1 02/28/17: 0.1 03/28/17: 0.1 04/25/17: 0.1 05/23/17: 0.1 06/27/17: 0.1 08/01/17: 0.1 2:15/19: 0.1 09/26/17: 0.2  IgG [phone]) 10/13/2014: 1710 11/21/2014: 1460 01/30/2015: 1380 03/13/15: 1570 04/20/2015: 1550 06/01/2015: 1590 07/22/2015: 1770 09/14/2015: 0814 10/26/2015: 1898 12/07/2015: 2045 02/07/2016: 1883 04/03/2016: 787 05/03/2016: 685  05/31/2016: 544 06/28/2016: 463 07/25/2016: 455 08/21/2016: 426 09/27/2016: 438 10/23/16: 374 12/06/16: 370 12/27/16: 343, 346 01/24/17: 332  02/28/17: 362 03/28/17:  436 04/25/17: 431 05/23/17: 471 06/27/17: 429 08/01/17: 392 08/29/17: 409 09/26/17: 421  Serum kappa, lambda light chains, and ratio  11/21/2014: 3.95, 2.08, 1.90  01/30/2015: 3.92, 2.64, 1.48 03/13/2015: 3.55, 2.17, 1.64 04/20/15: 4.52, 1.78, 2.54 06/01/2015: 4.82, 1.75, 2.75 07/20/2015: 8.71, 2.43, 3.58 09/14/2015: 9.17, 2.02, 4.55 10/26/2015: 11.4, 1.75, 6.42 12/07/2015: 1.54, 1.83, 8.44 02/07/2016: 1.93, 1.42, 13.6 04/03/2016: 0.96, 0.95, 1.01  05/03/2016: 0.71, 0.34, 2.09  05/31/2016: 0.48, 0.26, 1.85 06/28/2016: 0.52, 0.60, 0.87 07/25/2016: 0.46, 0.27, 1.70 08/21/2016: 0.43, 0.27, 1.59 09/27/2016: 0.52, 0.24, 2.17 10/23/16: .0.49, 0.35, 1.40 12/06/16: 0.68, 0.29, 2.34 12/27/16: 0.54, 0.29, 1.86 02/28/17: 0.56, 0.30, 1.87 04/11/17: 0.61, 0.29, 2.10 05/09/17: 0.57, 0.33, 1.73 06/13/17: 0.65, 0.23, 2.83 07/18/17: 0.75 0.26, 2.83 08/15/17: 0.78, 0.27, 2.89 08/29/17: 0.79, 0.3, 2.63 09/26/17: 0.88, 0.26, 3.38   24 hr urine PEP (total protein, M-spike) 10/13/2014: (-) 02/12/2016: '265mg'$ ,  '68mg'$  04/05/2016: 114 mg, (-) 08/08/16: 99, (-) 12/27/16: '59mg'$ , (-) 03/28/17:  $'96mg'j$ , (-)   PATHOLOGY REPORT  Diagnosis 01/24/2016 Bone Marrow, Aspirate,Biopsy, and Clot, RT iliac and core - VARIABLY CELLULAR BONE MARROW WITH PLASMA CELL NEOPLASM. - SEE COMMENT. PERIPHERAL BLOOD: - MACROCYTIC ANEMIA.  Diagnosis Note The bone marrow is variably cellular with trilineage hematopoiesis and non specific myeloid changes. In this background, the plasma cell component is variable ranging from less than 10 % in many areas to 30% in some aspirate particles with relatively low cellularity. Foci of interstitial infiltrates and small clusters are also seen in the clot section. Immunohistochemical stains show kappa light chain restriction in atypical plasma cell clusters and infiltrates consistent with plasma cell neoplasm. Correlation with cytogenetic and FISH studies is recommended. (BNS:gt, 02/03/16)  Diagnosis 03/28/16 PLEURAL FLUID, LEFT (SPECIMEN 1 OF 1 COLLECTED 03/28/16): REACTIVE MESOTHELIAL CELLS  RADIOGRAPHIC STUDIES:  ECHO 03/12/17 Study Conclusions - Left ventricle: The cavity size was normal. Wall thickness was   increased in a pattern of mild LVH. Systolic function was normal.   The estimated ejection fraction was in the range of 50% to 55%.   There is hypokinesis of the basalinferior myocardium. Doppler   parameters are consistent with abnormal left ventricular   relaxation (grade 1 diastolic dysfunction). - Aortic valve: There was trivial regurgitation. - Mitral valve: Calcified annulus. Mildly thickened leaflets .   There was trivial regurgitation. - Tricuspid valve: There was mild regurgitation. Impressions: - There has been mild improvement in EF since prior study.    Echo 12/03/16 -Left Ventricle: Systolic function was   mildly to moderately reduced. The estimated ejection fraction was   in the range of 40% to 45%. Diffuse hypokinesis. Doppler  parameters are consistent with  abnormal left ventricular relaxation (grade 1 diastolic dysfunction). Doppler parameters are consistent with indeterminate ventricular filling pressure. - Left atrium: The atrium was severely dilated. - Tricuspid valve: There was trivial regurgitation. - Pulmonary arteries: Systolic pressure was within the normal   range. PA peak pressure: 33 mm Hg (S). - Global longitudinal strain -10.5% (abnormal)  Echo 08/26/2016 - Left ventricle: LVEF is approximately 35% with diffuse hypokinesis with severe hypokinesis/ akinesis of the inferior/inferoseptal walls. In comparison to echo images from December 2017, there does not appear to be significant change The cavity size was normal. Wall thickness was normal. Doppler parameters are consistent with abnormal left ventricular relaxation (grade 1 diastolic dysfunction). - Aortic valve: AV is thickened. 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 Present in echo from December 2017 There was trivial regurgitation. - Right ventricle: Systolic function was mildly reduced. - Right atrium: The atrium was mildly dilated. - Pericardium, extracardiac: A trivial pericardial effusion was identified.  ASSESSMENT: Shannon Rush 77 y.o. female with a history of Multiple Myeloma s/p autotransplant in August 2014 followed by maintenance low dose Revlimid therapy, and relapsed in 01/2016  PLAN:   1. IgG kappa Multiple myeloma s/p Auto SCT, intermediate risk, relapsed in 01/2016 --MM markers demonstrate a very good initial response (M protein baseline 2.6, down to 0.18 after induction chemo and transplant, stable around 0.5 since July 2015, but has gradually increased lately),  she unfortunately relapsed in July 2017 -I previously discussed her repeated bone marrow biopsy from 02/01/2016, which showed 10-30% plasma cells in her marrow -I agreed with Dr. Kendell Bane recommendation to stop Revlimid and change to  Daratumumab, Pomalidomide and dexamethasone due to her disease relapse. I don't think she has resistance to Revlimid, she has been on very  small dose of Revlimid due to her prior history of multiple infections. -She started first cycle DaraPD in 02/2016, but in 2 weeks she developed severe pancytopenia, pneumonia with sepsis required 3 weeks hospital stay. She did have a very nice response to the treatment, her M-protein has significantly reduced from 1.2 to 0.2, light chain levels normalized even with half cycle treatment  -I recommended her to change regiment to DaraVD, and started her on dara every 2 weeks for 2 doses, she tolerated this very well -she has had good response to treatment, M-protein significantly dropped -Her previous echo in December 2017 showed a significant drop on EF, she is not much symptomatically for CHF, due to the potential contribution of Velcade to her CHF, I'll hold her Velcade for now. Her repeated echo in May 2018 showed a significant improvement, EF 40-45%.. -her dara-specific IFE in 12/2016 was still positive, she has low level M-protein, she has achieved a good partial response. -She hasn't had Velcade since December 2017. Will continue holding it for now due to her CHF -We previously discussed her ECHO from 03/12/17 that showed her heart function has normalized to EF of 50% to 55%.  -She has seen Dr. [name] at [facility] who suggests she use a combination treatment of Dara + low dose Pomalidomide with and increase treatment to every 2 weeks.  -I previously discussed the available data of Daratumumab as single agent or in combination with Pomalidomide or Velcade. Due to her previous severe cytopenia and infection, and a prolonged excellent response to single agent Dara, I did not recommend her to change to DaraPD again.  We decided to change Dara/dexa treatment to every 2 weeks on 03/28/17, she has been tolerating well overall. Her M-protein level has not stable (0.1-0.2) since  then  -if she has disease progression, I would consider adding Velcade back -I again advised her to drink adequate water, take magnesium, continue calcium and vitamin D to help her muscle cramping -CBC reviewed, CBC and MM panel overall stable. Overall adequate to proceed with Dara infusion today. -I tested her HBV and HCV due to the risk of faire secondary to Athena, which all came back negative.  I discussed the results with patient -She is clinically doing well, no significant side effects from treatment so far, her M protein has been stable at low level 0.1, but still positive, will continue Dara/Dexa every 2 weeks  -Lab, f/u and daratumumab in 3 weeks (will postpone for week due to her upcoming cruise trip)   2. Pancytopenia  -secondary to Dara, slightly worse after we increased Dara to every 2 weeks  -Continue monitoring closely. -She has mild anemia, overall stable -Blood counts stable except continued mild anemia   3. CHF, CAD, inferior STEMI in 02/2014 -continue metoprolol and lipitor, Dr. Meda Coffee previously started her on lisinopril also -previously followed up with Dr. Meda Coffee  -her previous echo on 08/26/16 showed improvement with EF 30-35% from 25-30%, further improved to 40-45%, echo in May 2018 -Dr. Meda Coffee plans to repeat her echo every 3 months when she is on treatment. -Her 03/12/17 ECHO shows improved heart function to normal range of EF at 50% to 55%. -I previously encouraged her to watch for leg swelling and SBO and chest pain as these are symptoms of her heart condition.   4. Osteoporosis -previous bone density scan showed worsening osteoporosis, continue Zometa, changed to every 3 months after 03/2015.  -she declined repeating DEXA  -she was on Zometa for 4 years,  the risk is probably outweighs the benefit at this point, we have stopped it, last injection was 10/23/16  5. Peripheral neuropathy in feet, G1-2 -Secondary to previous chemotherapy. Slightly worse lately. -We  previously discussed the role of Neurontin, she declined. -I previously suggested Vitamin B complex and continue practice of circulation of her feet. I advised her to be careful to not fall.  -Will monitor, she will let us know if this worsens.    6. Goal of care discussion  -We previously discussed the incurable nature of her cancer, and the overall poor prognosis, especially if she does not have good response to chemotherapy or progress on chemo -The patient understands the goal of care is palliative. -she is full code now    Plan -She is clinically doing well, labs reviewed, adequate for treatment, will proceed daratumumab today  -lab, f/u and daratumumab in 3 weeks    All questions were answered. The patient knows to call the clinic with any problems, questions or concerns. We can certainly see the patient much sooner if necessary.  I spent 20 minutes counseling the patient face to face. The total time spent in the appointment was 25 minutes.  This document serves as a record of services personally performed by [name], MD. It was created on her behalf by Steva Colder, a trained medical scribe. The creation of this record is based on the scribe's personal observations and the provider's statements to them.   I have reviewed the above documentation for accuracy and completeness, and I agree with the above.    [name]  11/21/2017 6:04 PM

## 2017-11-21 ENCOUNTER — Inpatient Hospital Stay: Payer: Medicare Other | Attending: Hematology

## 2017-11-21 ENCOUNTER — Encounter: Payer: Self-pay | Admitting: Hematology

## 2017-11-21 ENCOUNTER — Inpatient Hospital Stay: Payer: Medicare Other

## 2017-11-21 ENCOUNTER — Inpatient Hospital Stay (HOSPITAL_BASED_OUTPATIENT_CLINIC_OR_DEPARTMENT_OTHER): Payer: Medicare Other | Admitting: Hematology

## 2017-11-21 VITALS — BP 122/72 | HR 69 | Temp 97.8°F | Resp 18 | Ht 68.0 in | Wt 123.4 lb

## 2017-11-21 VITALS — BP 110/60 | HR 63 | Temp 98.1°F | Resp 18

## 2017-11-21 DIAGNOSIS — I2510 Atherosclerotic heart disease of native coronary artery without angina pectoris: Secondary | ICD-10-CM

## 2017-11-21 DIAGNOSIS — Z923 Personal history of irradiation: Secondary | ICD-10-CM | POA: Insufficient documentation

## 2017-11-21 DIAGNOSIS — C9000 Multiple myeloma not having achieved remission: Secondary | ICD-10-CM

## 2017-11-21 DIAGNOSIS — D539 Nutritional anemia, unspecified: Secondary | ICD-10-CM | POA: Diagnosis not present

## 2017-11-21 DIAGNOSIS — C9002 Multiple myeloma in relapse: Secondary | ICD-10-CM | POA: Insufficient documentation

## 2017-11-21 DIAGNOSIS — I252 Old myocardial infarction: Secondary | ICD-10-CM | POA: Diagnosis not present

## 2017-11-21 DIAGNOSIS — I509 Heart failure, unspecified: Secondary | ICD-10-CM

## 2017-11-21 DIAGNOSIS — Z880 Allergy status to penicillin: Secondary | ICD-10-CM | POA: Diagnosis not present

## 2017-11-21 DIAGNOSIS — Z5111 Encounter for antineoplastic chemotherapy: Secondary | ICD-10-CM | POA: Diagnosis not present

## 2017-11-21 DIAGNOSIS — D61818 Other pancytopenia: Secondary | ICD-10-CM | POA: Insufficient documentation

## 2017-11-21 DIAGNOSIS — G629 Polyneuropathy, unspecified: Secondary | ICD-10-CM | POA: Insufficient documentation

## 2017-11-21 DIAGNOSIS — G620 Drug-induced polyneuropathy: Secondary | ICD-10-CM | POA: Diagnosis not present

## 2017-11-21 DIAGNOSIS — C9001 Multiple myeloma in remission: Secondary | ICD-10-CM

## 2017-11-21 DIAGNOSIS — M4854XA Collapsed vertebra, not elsewhere classified, thoracic region, initial encounter for fracture: Secondary | ICD-10-CM | POA: Insufficient documentation

## 2017-11-21 DIAGNOSIS — M810 Age-related osteoporosis without current pathological fracture: Secondary | ICD-10-CM

## 2017-11-21 DIAGNOSIS — Z79899 Other long term (current) drug therapy: Secondary | ICD-10-CM | POA: Diagnosis not present

## 2017-11-21 DIAGNOSIS — Z881 Allergy status to other antibiotic agents status: Secondary | ICD-10-CM | POA: Insufficient documentation

## 2017-11-21 DIAGNOSIS — T451X5S Adverse effect of antineoplastic and immunosuppressive drugs, sequela: Secondary | ICD-10-CM | POA: Diagnosis not present

## 2017-11-21 DIAGNOSIS — R5381 Other malaise: Secondary | ICD-10-CM

## 2017-11-21 DIAGNOSIS — Z95828 Presence of other vascular implants and grafts: Secondary | ICD-10-CM | POA: Insufficient documentation

## 2017-11-21 LAB — CBC WITH DIFFERENTIAL (CANCER CENTER ONLY)
BASOS ABS: 0 10*3/uL (ref 0.0–0.1)
BASOS PCT: 0 %
EOS ABS: 0 10*3/uL (ref 0.0–0.5)
EOS PCT: 0 %
HCT: 33.5 % — ABNORMAL LOW (ref 34.8–46.6)
Hemoglobin: 11.6 g/dL (ref 11.6–15.9)
Lymphocytes Relative: 5 %
Lymphs Abs: 0.2 10*3/uL — ABNORMAL LOW (ref 0.9–3.3)
MCH: 36.3 pg — ABNORMAL HIGH (ref 25.1–34.0)
MCHC: 34.6 g/dL (ref 31.5–36.0)
MCV: 105 fL — ABNORMAL HIGH (ref 79.5–101.0)
Monocytes Absolute: 0.1 10*3/uL (ref 0.1–0.9)
Monocytes Relative: 2 %
Neutro Abs: 3.2 10*3/uL (ref 1.5–6.5)
Neutrophils Relative %: 93 %
PLATELETS: 174 10*3/uL (ref 145–400)
RBC: 3.19 MIL/uL — ABNORMAL LOW (ref 3.70–5.45)
RDW: 14.8 % — AB (ref 11.2–14.5)
WBC Count: 3.5 10*3/uL — ABNORMAL LOW (ref 3.9–10.3)

## 2017-11-21 LAB — COMPREHENSIVE METABOLIC PANEL
ALT: 18 U/L (ref 0–55)
AST: 19 U/L (ref 5–34)
Albumin: 4.2 g/dL (ref 3.5–5.0)
Alkaline Phosphatase: 48 U/L (ref 40–150)
Anion gap: 10 (ref 3–11)
BUN: 23 mg/dL (ref 7–26)
CHLORIDE: 106 mmol/L (ref 98–109)
CO2: 21 mmol/L — AB (ref 22–29)
CREATININE: 0.96 mg/dL (ref 0.60–1.10)
Calcium: 9.6 mg/dL (ref 8.4–10.4)
GFR calc Af Amer: >60 mL/min (ref >60)
GFR calc non Af Amer: 56 mL/min — ABNORMAL LOW (ref >60)
Glucose, Bld: 162 mg/dL — ABNORMAL HIGH (ref 70–140)
POTASSIUM: 4 mmol/L (ref 3.5–5.1)
SODIUM: 137 mmol/L (ref 136–145)
Total Bilirubin: 0.6 mg/dL (ref 0.2–1.2)
Total Protein: 6.4 g/dL (ref 6.4–8.3)

## 2017-11-21 MED ORDER — ACETAMINOPHEN 325 MG PO TABS
ORAL_TABLET | ORAL | Status: AC
  Filled 2017-11-21: qty 2

## 2017-11-21 MED ORDER — MONTELUKAST SODIUM 10 MG PO TABS
10.0000 mg | ORAL_TABLET | Freq: Once | ORAL | Status: AC
  Administered 2017-11-21: 10 mg via ORAL

## 2017-11-21 MED ORDER — DIPHENHYDRAMINE HCL 25 MG PO CAPS
ORAL_CAPSULE | ORAL | Status: AC
  Filled 2017-11-21: qty 2

## 2017-11-21 MED ORDER — DIPHENHYDRAMINE HCL 25 MG PO CAPS
50.0000 mg | ORAL_CAPSULE | Freq: Once | ORAL | Status: AC
  Administered 2017-11-21: 50 mg via ORAL

## 2017-11-21 MED ORDER — SODIUM CHLORIDE 0.9% FLUSH
10.0000 mL | Freq: Once | INTRAVENOUS | Status: DC
  Filled 2017-11-21: qty 10

## 2017-11-21 MED ORDER — METHYLPREDNISOLONE SODIUM SUCC 40 MG IJ SOLR
INTRAMUSCULAR | Status: AC
  Filled 2017-11-21: qty 1

## 2017-11-21 MED ORDER — DIPHENHYDRAMINE HCL 25 MG PO CAPS
ORAL_CAPSULE | ORAL | Status: AC
Start: 2017-11-21 — End: ?
  Filled 2017-11-21: qty 1

## 2017-11-21 MED ORDER — MONTELUKAST SODIUM 10 MG PO TABS
ORAL_TABLET | ORAL | Status: AC
  Filled 2017-11-21: qty 1

## 2017-11-21 MED ORDER — ACETAMINOPHEN 325 MG PO TABS
650.0000 mg | ORAL_TABLET | Freq: Once | ORAL | Status: AC
  Administered 2017-11-21: 650 mg via ORAL

## 2017-11-21 MED ORDER — METHYLPREDNISOLONE SODIUM SUCC 125 MG IJ SOLR
INTRAMUSCULAR | Status: AC
  Filled 2017-11-21: qty 2

## 2017-11-21 MED ORDER — METHYLPREDNISOLONE SODIUM SUCC 125 MG IJ SOLR
125.0000 mg | Freq: Once | INTRAMUSCULAR | Status: AC
  Administered 2017-11-21: 125 mg via INTRAVENOUS

## 2017-11-21 MED ORDER — SODIUM CHLORIDE 0.9 % IV SOLN
16.0000 mg/kg | Freq: Once | INTRAVENOUS | Status: AC
  Administered 2017-11-21: 900 mg via INTRAVENOUS
  Filled 2017-11-21: qty 40

## 2017-11-21 MED ORDER — SODIUM CHLORIDE 0.9 % IV SOLN
Freq: Once | INTRAVENOUS | Status: AC
  Administered 2017-11-21: 11:00:00 via INTRAVENOUS

## 2017-11-21 NOTE — Patient Instructions (Addendum)
[facility] Discharge Instructions for Patients Receiving Chemotherapy  Today you received the following chemotherapy agents:  Darzalex  To help prevent nausea and vomiting after your treatment, we encourage you to take your nausea medication as prescribed.   If you develop nausea and vomiting that is not controlled by your nausea medication, call the clinic.   BELOW ARE SYMPTOMS THAT SHOULD BE REPORTED IMMEDIATELY:  *FEVER GREATER THAN 100.5 F  *CHILLS WITH OR WITHOUT FEVER  NAUSEA AND VOMITING THAT IS NOT CONTROLLED WITH YOUR NAUSEA MEDICATION  *UNUSUAL SHORTNESS OF BREATH  *UNUSUAL BRUISING OR BLEEDING  TENDERNESS IN MOUTH AND THROAT WITH OR WITHOUT PRESENCE OF ULCERS  *URINARY PROBLEMS  *BOWEL PROBLEMS  UNUSUAL RASH Items with * indicate a potential emergency and should be followed up as soon as possible.  Feel free to call the clinic should you have any questions or concerns. The clinic phone number is [phone].  Please show the CHEMO ALERT CARD at check-in to the Emergency Department and triage nurse.   

## 2017-11-24 ENCOUNTER — Ambulatory Visit: Payer: Medicare Other | Admitting: Cardiology

## 2017-11-24 LAB — KAPPA/LAMBDA LIGHT CHAINS
KAPPA FREE LGHT CHN: 9.9 mg/L (ref 3.3–19.4)
Kappa, lambda light chain ratio: 4.13 — ABNORMAL HIGH (ref 0.26–1.65)
Lambda free light chains: 2.4 mg/L — ABNORMAL LOW (ref 5.7–26.3)

## 2017-11-25 LAB — PROTEIN ELECTROPHORESIS, SERUM
A/G RATIO SPE: 1.9 — AB (ref 0.7–1.7)
ALBUMIN ELP: 4 g/dL (ref 2.9–4.4)
ALPHA-1-GLOBULIN: 0.3 g/dL (ref 0.0–0.4)
ALPHA-2-GLOBULIN: 0.6 g/dL (ref 0.4–1.0)
BETA GLOBULIN: 0.8 g/dL (ref 0.7–1.3)
Gamma Globulin: 0.4 g/dL (ref 0.4–1.8)
Globulin, Total: 2.1 g/dL — ABNORMAL LOW (ref 2.2–3.9)
M-Spike, %: 0.2 g/dL — ABNORMAL HIGH
Total Protein ELP: 6.1 g/dL (ref 6.0–8.5)

## 2017-11-25 LAB — IFE, DARA-SPECIFIC, SERUM
IGM (IMMUNOGLOBULIN M), SRM: 13 mg/dL — AB (ref 26–217)
IgG (Immunoglobin G), Serum: 465 mg/dL — ABNORMAL LOW (ref 700–1600)

## 2017-12-11 NOTE — Progress Notes (Signed)
[facility] ONCOLOGY OFFICE PROGRESS NOTE  PCP: [name], MD [place] [address]  Date of Service:  12/12/2017   DIAGNOSIS: Multiple myeloma with relapse  CHIEF COMPLAINTS: fever, productive cough and dyspnea   Oncology History   Multiple myeloma ([place])   Staging form: Multiple Myeloma, AJCC 6th Edition   - Clinical: Stage IIIA - Signed by [name], MD on 09/04/2013      Multiple myeloma ([place])   05/26/2012 Initial Diagnosis    Presenting IgG was 4,040 mg/dL on 06/05/2012 (IgA 35; IgM 34); kappa free light chain 34.4 mg/dL, lambda 0.00, kappa:lambda ratio of 34.75; SPEP with M-spike of 2.60.      06/30/2012 Imaging    Numerous lytic lesions throughout the calvarium.  Lytic lesion within the left lateral scapula.  Findings most compatible with metastases or myeloma. Multiple mid and lower thoracic compression fractures.  Slight compression through the endplates at L3.      46/96/2952 Bone Marrow Biopsy    Bone marrow biopsy showed 49% plasma cell. Normal classical cytogenetics; however, myeloma FISH panel showed 13q- (intermediate risk)          07/14/2012 - 07/27/2012 Radiation Therapy    Palliative radiation 20 Gy over 10 fractions between to thoracic spine cord compression and symptomatic left scapula lytic lesion.      08/10/2012 - 11/2012 Chemotherapy    Started SQ Velcade once weekly, 3 weeks on, 1 weeks off; daily Revlimid d1-21, 7 days off; and Dexamethasone 4m PO weekly.       02/12/2013 [facility] Marrow Transplant    [facility]       06/14/2013 Tumor Marker    IgG  1830,  Kappa:lamba ratio, 1.69 (baseline was 34.75 or   M-spike 0.28 (baseline of 2.6 or  89.3% of baseline).        06/25/2013 -  Chemotherapy    Started zometa 3.5 mg monthly       09/01/2013 - 01/2014 Chemotherapy    Maintenance therapy with revlimid 549mdaily for 14 days and then off for 7 (decreased from 21 days on and 7 days  off based on neutropenia). Stopped due to disease progression 01/2017      09/13/2013 - 09/17/2014 Hospital Admission    Hospital admission for pneumonia      12/20/2013 Treatment Plan Change    Maintenance therapy decreased to 2.5 mg daily for 21 days and then off for 7 days based on low counts.       01/25/2014 Tumor Marker    IgG 1360 M spike 0.5      03/01/2014 - 03/05/2014 Hospital [facility] received thrombolytic therapy, cath showed 60% prox RCA, mid-distal RCA 50%, Echo normal, no stents.      04/11/2014 - 08/07/2014 Chemotherapy    Continue Revlimid at 2.5 mg 3 weeks on 1 week off      08/08/2014 - 08/13/2014 Hospital Admission    Hospital admission for pneumonia. Her Revlimid was held.      08/29/2014 - 09/14/2014 Chemotherapy    Maintenance Revlimid restarted      09/14/2014 - 09/17/2014 Hospital Admission    [place]      11/13/2014 - 12/25/2015 Chemotherapy    Maintenance Revlimid restarted, changed to 2.44m62maily, 2 weks on, 1 week  off from 12/15/2014, stopped due to disease progression       01/24/2016 Progression    Patient is M protein has gradually increased to 1.0g, repeated a bone marrow biopsy showed plasma cell 10-30%      02/27/2016 - 03/07/2016 Chemotherapy    Pomalidomide 4 mg daily on day 1-21, dexamethasone 40 mg on day 1, 8 and 15, every 28 days, started on 02/27/2016, daratumumab weekly started on 03/07/2016, held after first dose, due to hospitalization and a severe pancytopenia, pomalidomide was stopped afterwards       03/10/2016 - 03/29/2016 Hospital Admission    Pt was admitted for sepsis from pneumonia, and severe pancytopenia. She required ICU stay for a few days due to hypotension, developed b/l pleural effusion required diuretics and b/l thoracentesis. She also required blood and plt transfusion, and prolonged  neupogen injection for severe neutropenia. She was discharged home after 3 weeks hospital stay       04/26/2016 -  Chemotherapy    Daratumumab restarted on 04/26/2016, Velcade 1.42m/m2 and dexa 267mweekly start on 11/3, velcade held since 06/28/2016 due to CHF       08/26/2016 Echocardiogram    - Left ventricle: LVEF is approximately 35% with diffuse diffuse   hypokinesis with severe hypokinesis/ akinesis of the   inferior/inferoseptal walls. In comparison to echo images from   December 2017, there does not appear to be significant change The   cavity size was normal. Wall thickness was normal. Doppler   parameters are consistent with abnormal left ventricular   relaxation (grade 1 diastolic dysfunction). - Aortic valve: AV is thickened. 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 Present in echo from December 2017 There was   trivial regurgitation. - Right ventricle: Systolic function was mildly reduced. - Right atrium: The atrium was mildly dilated. - Pericardium, extracardiac: A trivial pericardial effusion was   identified.      12/03/2016 Echocardiogram    -Left Ventricle: Systolic function was   mildly to moderately reduced. The estimated ejection fraction was   in the range of 40% to 45%. Diffuse hypokinesis. Doppler   parameters are consistent with abnormal left ventricular   relaxation (grade 1 diastolic dysfunction). Doppler parameters   are consistent with indeterminate ventricular filling pressure. - Left atrium: The atrium was severely dilated. - Tricuspid valve: There was trivial regurgitation. - Pulmonary arteries: Systolic pressure was within the normal   range. PA peak pressure: 33 mm Hg (S). - Global longitudinal strain -10.5% (abnormal)      03/12/2017 Echocardiogram    ECHO 03/12/17 Study Conclusions - Left ventricle: The cavity size was normal. Wall thickness was   increased in a pattern of mild  LVH. Systolic function was normal.   The estimated ejection fraction was in the range of 50% to 55%.   There is hypokinesis of the basalinferior myocardium. Doppler   parameters are consistent with abnormal left ventricular   relaxation (grade 1 diastolic dysfunction). - Aortic valve: There was trivial regurgitation. - Mitral valve: Calcified annulus. Mildly thickened leaflets .   There was trivial regurgitation. - Tricuspid valve: There was mild regurgitation. Impressions: - There has been mild improvement in EF since prior study.         CURRENT TREATMENT:   1. Pomalidomide 4 mg daily on day 1-21, dexamethasone 40on day 1, 8 and 15, every 28 days, started on 02/27/2016, daratumumab weekly started on 03/07/2016, treatment on hold  since 03/09/2016 due to hospitalization and severe cytopenia 2. Daratumumab restarted on 04/26/2016, Velcade 1.9m/m2 and dexa 279mweekly start on 11/3, velcade held since 06/28/2016 due to CHF. DaTonette Biharias changed from every 4 weeks to every 2 weeks on 03/28/2017 per Dr. GaKendell Baneecommendation.    INTERVAL HISTORY:  ClCLARA HERBISON89.o. female  here for a scheduled follow up and Daratumumab infusion. She presents to the clinic by herself today noting she is sick.   On review of symptoms, pt notes fever and cough with white-yellowish phlegm which started about 5 days ago during her cruise trip. She returned from her cruise trip yesterday and called that she had a fever of 101F. She called the hotline yesterday and was given doxycyline. Her sickness started 5 days ago during her cruise. At first she tried to get cold medicine but did not check her temperature until she returned from her trip. She notes she took dexamethasone before coming in today. She denies a skin break down. She note congestion, sore throat headache and low appetite. She notes diarrhea started 3 days ago. She notes back pain possible from coughing. She notes other people were sick on the  cruise.    MEDICAL HISTORY: Past Medical History:  Diagnosis Date  . CAD (coronary artery disease)    [place]  p[place] 60%, mid to dist RCA 50% >>> med rx  . Cord compression ([facility]   MRI- diffuse myeloma involvement of T-L spine  . History of radiation therapy 07/13/12-07/27/12   spinal cord compression T3-T10,left scapula  . Hx of echocardiogram    [facility]  Normal LVEF without significant regional wall motion abnormalities. Mild   . Multiple myeloma (HCShafter12/18/2013  . OSTEOPOROSIS 06/11/2010   Multiple compression fractures; and spontaneous fracture of sternum Qualifier: Diagnosis of  By: SaZebedee IbaP, SuManuela Schwartz   . Thoracic kyphosis 07/13/12   per MRI scan  . Unspecified deficiency anemia    ALLERGIES:  is allergic to zosyn [piperacillin sod-tazobactam so]; levaquin [levofloxacin in d5w]; other; piperacillin-tazobactam in dex; zithromax [azithromycin]; and ciprofloxacin.  SURGICAL HISTORY:  Past Surgical History:  Procedure Laterality Date  . APPENDECTOMY    . CESAREAN SECTION     x2   . COLONOSCOPY  2007   neg with Dr. MaWatt Climes. ELBOW SURGERY    . HIP SURGERY  2009   left  . TUBAL LIGATION     REVIEW OF SYSTEMS:   Constitutional: Denies abnormal weight loss (+) weight loss (+) no appetite (+) headache (+) fever Eyes: Denies blurriness of vision  Ears, nose, mouth, throat, and face: Denies mucositis (+) sore throat Respiratory: Denies dyspnea or wheezes (+) cough with white/yellowish phlegm, congestion Cardiovascular: Denies palpitation, chest discomfort  Gastrointestinal:  Denies heartburn (+) nausea (+) diarrhea, loose stool  Skin: Denies abnormal skin rashes  Lymphatics: Denies new lymphadenopathy or easy bruising MSK: (+) muscle cramping in lower legs, hands and diaphragm  Neurological:Denies new weaknesses. Positive for paraesthesias and  numbness to the feet (baseline). (+) neuropathy of her feet  Behavioral/Psych: Mood is stable, no new changes  All other systems were reviewed with the patient and are negative.   PHYSICAL EXAMINATION:  ECOG PERFORMANCE STATUS: 2 BP 133/69 (Patient Position: Sitting)   Pulse (!) 101 Comment: Cheryl RN is aware  Temp 100 F (37.8 C) (Oral)   Resp 20  Ht 5' 8" (1.727 m)   Wt 119 lb 8 oz (54.2 kg)   SpO2 93%   BMI 18.17 kg/m   GENERAL:alert, no distress and comfortable; easily mobile to exam table; kyphosis, moderate.  SKIN: skin color, texture, turgor are normal, no rashes or significant lesions except a small healing, dry wound in the right lower leg above his ankle, with surrounding skin erythema and swelling EYES: normal, Conjunctiva are pink, 1 tear/injection blood vessel left eye in sclera. OROPHARYNX:no exudate, no erythema and lips, buccal mucosa, and tongue normal, no ONJ NECK: supple, thyroid normal size, non-tender, without nodularity; nodules cervical bilaterally.  LYMPH:  no palpable lymphadenopathy in the cervical, axillary or supraclavicular LUNGS: (+) diffuse rales and rhonchi through bilateral lungs with normal breathing effort, no wheezes  HEART: regular rate & rhythm and no murmurs  ABDOMEN:abdomen soft, and normal bowel sounds (+) upper abdominal soreness Musculoskeletal:no cyanosis of digits and no clubbing, NEURO: alert & oriented x 3 with fluent speech, no focal motor/sensory deficits   Labs:  CBC Latest Ref Rng & Units 12/12/2017 11/21/2017 11/07/2017  WBC 3.9 - 10.3 K/uL 7.5 3.5(L) 3.4(L)  Hemoglobin 11.6 - 15.9 g/dL 10.7(L) 11.6 11.0(L)  Hematocrit 34.8 - 46.6 % 30.3(L) 33.5(L) 32.3(L)  Platelets 145 - 400 K/uL 191 174 162    CMP Latest Ref Rng & Units 12/12/2017 11/21/2017 11/07/2017  Glucose 70 - 140 mg/dL 117 162(H) 194(H)  BUN 7 - 26 mg/dL _0 Creatinine 0.60 - 1.10 mg/dL 0.91 0.96 0.96  Sodium 136 - 145 mmol/L 134(L) 137 138  Potassium 3.5 - 5.1  mmol/L 3.7 4.0 4.1  Chloride 98 - 109 mmol/L 101 106 106  CO2 22 - 29 mmol/L 22 21(L) 20(L)  Calcium 8.4 - 10.4 mg/dL 9.5 9.6 9.6  Total Protein 6.4 - 8.3 g/dL 6.8 6.4 6.3(L)  Total Bilirubin 0.2 - 1.2 mg/dL 0.7 0.6 0.7  Alkaline Phos 40 - 150 U/L 114 48 45  AST 5 - 34 U/L 35(H) 19 19  ALT 0 - 55 U/L 33 18 14    SPEP M-PROTEIN 10/01/2013: 0.18 (nadir)  10/13/2014: 0.6 11/21/2014: 0.5 01/30/2015: 0.6 03/13/2015: 0.6 04/20/2015: 0.5 06/01/2015: 0.5 07/20/2015: 0.7 09/14/2015: 0.7 10/26/2015: 0.8 12/07/2015: 1.0 02/07/2016: 1.1  04/03/2016: 0.2 05/03/2016: 0.2  05/31/2016: 0.1 06/28/2016: 0.1 07/25/2016: 0.1 08/21/2016: 0.1 09/27/2016: 0.1 10/23/16: 0.1 12/27/2016: 0.1 (Dara Specific IFE was still positive) 01/24/17: 0.1 02/28/17: 0.1 03/28/17: 0.1 04/25/17: 0.1 05/23/17: 0.1 06/27/17: 0.1 08/01/17: 0.1 2:15/19: 0.1 09/26/17: 0.2 10/24/17: 0.1 11/21/17: 0.2   IgG ([id number]) 10/13/2014: 1710 11/21/2014: 1460 01/30/2015: 1380 03/13/15: 1570 04/20/2015: 1550 06/01/2015: 1590 07/22/2015: 1770 09/14/2015: 1787 10/26/2015: 1898 12/07/2015: 2045 02/07/2016: 1883 04/03/2016: 787 05/03/2016: 685  05/31/2016: 544 06/28/2016: 463 07/25/2016: 455 08/21/2016: 426 09/27/2016: 438 10/23/16: 374 12/06/16: 370 12/27/16: 343, 346 01/24/17: 332  02/28/17: 362 03/28/17:  436 04/25/17: 431 05/23/17: 471 06/27/17: 429 08/01/17: 392 08/29/17: 409 09/26/17: 421 10/24/17: 438 11/21/17: 465  Serum kappa, lambda light chains, and ratio  11/21/2014: 3.95, 2.08, 1.90  01/30/2015: 3.92, 2.64, 1.48 03/13/2015: 3.55, 2.17, 1.64 04/20/15: 4.52, 1.78, 2.54 06/01/2015: 4.82, 1.75, 2.75 07/20/2015: 8.71, 2.43, 3.58 09/14/2015: 9.17, 2.02, 4.55 10/26/2015: 11.4, 1.75, 6.42 12/07/2015: 1.54, 1.83, 8.44 02/07/2016: 1.93, 1.42, 13.6 04/03/2016: 0.96, 0.95, 1.01  05/03/2016: 0.71, 0.34, 2.09  05/31/2016: 0.48, 0.26, 1.85 06/28/2016: 0.52, 0.60, 0.87 07/25/2016: 0.46, 0.27, 1.70 08/21/2016: 0.43, 0.27, 1.59 09/27/2016: 0.52, 0.24,  2.17 10/23/16: .0.49, 0.35, 1.40 12/06/16: 0.68, 0.29, 2.34 12/27/16: 0.54, 0.29, 1.86  02/28/17: 0.56, 0.30, 1.87 04/11/17: 0.61, 0.29, 2.10 05/09/17: 0.57, 0.33, 1.73 06/13/17: 0.65, 0.23, 2.83 07/18/17: 0.75 0.26, 2.83 08/15/17: 0.78, 0.27, 2.89 08/29/17: 0.79, 0.3, 2.63 09/26/17: 0.88, 0.26, 3.38  10/24/17: 0.95, 0.36, 2.64 11/21/17: 0.99, 0.24, 4.13   24 hr urine PEP (total protein, M-spike) 10/13/2014: (-) 02/12/2016: 272m, 615m9/22/2017: 114 mg, (-) 08/08/16: 99, (-) 12/27/16: 5990m(-) 03/28/17: 63m20m-)   PATHOLOGY REPORT  Diagnosis 01/24/2016 Bone Marrow, Aspirate,Biopsy, and Clot, RT iliac and core - VARIABLY CELLULAR BONE MARROW WITH PLASMA CELL NEOPLASM. - SEE COMMENT. PERIPHERAL BLOOD: - MACROCYTIC ANEMIA.  Diagnosis Note The bone marrow is variably cellular with trilineage hematopoiesis and non specific myeloid changes. In this background, the plasma cell component is variable ranging from less than 10 % in many areas to 30% in some aspirate particles with relatively low cellularity. Foci of interstitial infiltrates and small clusters are also seen in the clot section. Immunohistochemical stains show kappa light chain restriction in atypical plasma cell clusters and infiltrates consistent with plasma cell neoplasm. Correlation with cytogenetic and FISH studies is recommended. (BNS:gt, 01/1306-28-17iagnosis 03/28/16 PLEURAL FLUID, LEFT (SPECIMEN 1 OF 1 COLLECTED 03/28/16): REACTIVE MESOTHELIAL CELLS  RADIOGRAPHIC STUDIES:  ECHO 03/12/17 Study Conclusions - Left ventricle: The cavity size was normal. Wall thickness was   increased in a pattern of mild LVH. Systolic function was normal.   The estimated ejection fraction was in the range of 50% to 55%.   There is hypokinesis of the basalinferior myocardium. Doppler   parameters are consistent with abnormal left ventricular   relaxation (grade 1 diastolic dysfunction). - Aortic valve: There was trivial regurgitation. - Mitral  valve: Calcified annulus. Mildly thickened leaflets .   There was trivial regurgitation. - Tricuspid valve: There was mild regurgitation. Impressions: - There has been mild improvement in EF since prior study.    Echo 12/03/16 -Left Ventricle: Systolic function was   mildly to moderately reduced. The estimated ejection fraction was   in the range of 40% to 45%. Diffuse hypokinesis. Doppler  parameters are consistent with abnormal left ventricular relaxation (grade 1 diastolic dysfunction). Doppler parameters are consistent with indeterminate ventricular filling pressure. - Left atrium: The atrium was severely dilated. - Tricuspid valve: There was trivial regurgitation. - Pulmonary arteries: Systolic pressure was within the normal   range. PA peak pressure: 33 mm Hg (S). - Global longitudinal strain -10.5% (abnormal)  Echo 08/26/2016 - Left ventricle: LVEF is approximately 35% with diffuse hypokinesis with severe hypokinesis/ akinesis of the inferior/inferoseptal walls. In comparison to echo images from December 2017, there does not appear to be significant change The cavity size was normal. Wall thickness was normal. Doppler parameters are consistent with abnormal left ventricular relaxation (grade 1 diastolic dysfunction). - Aortic valve: AV is thickened. 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 Present in echo from December 2017 There was trivial regurgitation. - Right ventricle: Systolic function was mildly reduced. - Right atrium: The atrium was mildly dilated. - Pericardium, extracardiac: A trivial pericardial effusion was identified.  ASSESSMENT: [place] AUDIAy7. female with a history of Multiple Myeloma s/p autotransplant in August 2014 followed by maintenance low dose Revlimid therapy, and relapsed in 01/2016    1. Fever and productive cough, likely pneumonia  -Presented after her cruise with productive cough with  white-yellowish phlegm, congestion, upper abdominal soreness, diarrhea/watery stool, , nausea, sore throat, no appetite, weight loss, fever and headaches. -She was given  doxycycline last night.  -Exam showed bilateral rales and rhonchi.  She desatted to 85% on room air when she walks.  I highly suspicious that this is pneumonia, possible bilateral. -She previously had multiple episodes of pneumonia, which required ICU stay and prolonged hospital stay.  She is on steroids in the daratumumab, immunocompromised.  -I recommend ED visit and Likely hospitalization.  She agrees.  My nurse sent her to worsen the emergency room  2. IgG kappa Multiple myeloma s/p Auto SCT, intermediate risk, relapsed in 01/2016 --MM markers demonstrate a very good initial response (M protein baseline 2.6, down to 0.18 after induction chemo and transplant, stable around 0.5 since July 2015, but has gradually increased lately),  she unfortunately relapsed in July 2017 -I previously discussed her repeated bone marrow biopsy from 02/01/2016, which showed 10-30% plasma cells in her marrow -I agreed with Dr. Kendell Bane recommendation to stop Revlimid and change to Daratumumab, Pomalidomide and dexamethasone due to her disease relapse. I don't think she has resistance to Revlimid, she has been on very small dose of Revlimid due to her prior history of multiple infections. -She started first cycle DaraPD in 02/2016, but in 2 weeks she developed severe pancytopenia, pneumonia with sepsis required 3 weeks hospital stay. She did have a very nice response to the treatment, her M-protein has significantly reduced from 1.2 to 0.2, light chain levels normalized even with half cycle treatment  -I recommended her to change regiment to DaraVD, and started her on dara every 2 weeks for 2 doses, she tolerated this very well -she has had good response to treatment, M-protein significantly dropped -Her previous echo in December 2017 showed a  significant drop on EF, she is not much symptomatically for CHF, due to the potential contribution of Velcade to her CHF, I'll hold her Velcade for now. Her repeated echo in May 2018 showed a significant improvement, EF 40-45%.. -her dara-specific IFE in 12/2016 was still positive, she has low level M-protein, she has achieved a good partial response. -She hasn't had Velcade since December 2017. Will continue holding it for now due to her CHF -We previously discussed her ECHO from 03/12/17 that showed her heart function has normalized to EF of 50% to 55%.  -She has seen Dr. [name] at [facility] who suggests she use a combination treatment of Dara + low dose Pomalidomide with and increase treatment to every 2 weeks.  -I previously discussed the available data of Daratumumab as single agent or in combination with Pomalidomide or Velcade. Due to her previous severe cytopenia and infection, and a prolonged excellent response to single agent Dara, I did not recommend her to change to DaraPD again.  We decided to change Dara/dexa treatment to every 2 weeks on 03/28/17, she has been tolerating well overall. Her M-protein level has not stable (0.1-0.2) since then  -if she has disease progression, I would consider adding Velcade back -I previously tested her HBV and HCV due to the risk of faire secondary to [place], which all came back negative. I discussed the results with patient -Given her current acute illness, she will not proceed with treatment today. May need to admit her to the hospital.    2. Pancytopenia  -secondary to [place], slightly worse after we increased Dara to every 2 weeks  -Continue monitoring closely. -She has mild anemia, overall stable -Blood counts stable except continued mild anemia    3. CHF, CAD, inferior STEMI in 02/2014 -continue metoprolol and lipitor, Dr. Meda Coffee previously started her  on lisinopril also -previously followed up with Dr. Meda Coffee  -her previous echo on 08/26/16 showed  improvement with EF 30-35% from 25-30%, further improved to 40-45%, echo in May 2018 -Dr. Meda Coffee plans to repeat her echo every 3 months when she is on treatment. -Her 03/12/17 ECHO shows improved heart function to normal range of EF at 50% to 55%. -I previously encouraged her to watch for leg swelling and SBO and chest pain as these are symptoms of her heart condition.   4. Osteoporosis -previous bone density scan showed worsening osteoporosis, continue Zometa, changed to every 3 months after 03/2015.  -she declined repeating DEXA  -she was on Zometa for 4 years, the risk is probably outweighs the benefit at this point, we have stopped it, last injection was 10/23/16  5. Peripheral neuropathy in feet, G1-2 -Secondary to previous chemotherapy. Slightly worse lately. -We previously discussed the role of Neurontin, she declined. -I previously suggested Vitamin B complex and continue practice of circulation of her feet. I advised her to be careful to not fall.  -Will monitor, she will let us know if this worsens.    6. Goal of care discussion  -We previously discussed the incurable nature of her cancer, and the overall poor prognosis, especially if she does not have good response to chemotherapy or progress on chemo -The patient understands the goal of care is palliative. -she is full code now    Plan -Due to her pulmonary infection, and hypoxia, I sent her to [facility] emergency room for further evaluation.  She will likely need hospitalization, and adequate IV antibiotics.  -hold chemo today (she took dexa 21m last night as pre-med for today's chemo)   All questions were answered. The patient knows to call the clinic with any problems, questions or concerns. We can certainly see the patient much sooner if necessary.  I spent 20 minutes counseling the patient face to face. The total time spent in the appointment was 25 minutes.  IOneal Deputy am acting as scribe for YTruitt Merle MD.   I have  reviewed the above documentation for accuracy and completeness, and I agree with the above.     YTruitt Merle 12/12/2017

## 2017-12-12 ENCOUNTER — Inpatient Hospital Stay: Payer: Medicare Other

## 2017-12-12 ENCOUNTER — Inpatient Hospital Stay (HOSPITAL_BASED_OUTPATIENT_CLINIC_OR_DEPARTMENT_OTHER): Payer: Medicare Other | Admitting: Hematology

## 2017-12-12 ENCOUNTER — Other Ambulatory Visit: Payer: Self-pay | Admitting: Cardiology

## 2017-12-12 ENCOUNTER — Encounter: Payer: Self-pay | Admitting: Hematology

## 2017-12-12 ENCOUNTER — Emergency Department (HOSPITAL_COMMUNITY): Payer: Medicare Other

## 2017-12-12 ENCOUNTER — Other Ambulatory Visit: Payer: Self-pay

## 2017-12-12 ENCOUNTER — Telehealth: Payer: Self-pay | Admitting: Hematology

## 2017-12-12 ENCOUNTER — Observation Stay (HOSPITAL_COMMUNITY)
Admission: EM | Admit: 2017-12-12 | Discharge: 2017-12-15 | Disposition: A | Discharge status: Home / Self Care | Payer: Medicare Other | Attending: Internal Medicine | Admitting: Internal Medicine

## 2017-12-12 ENCOUNTER — Encounter (HOSPITAL_COMMUNITY): Payer: Self-pay

## 2017-12-12 VITALS — BP 133/69 | HR 101 | Temp 100.0°F | Resp 20 | Ht 68.0 in | Wt 119.5 lb

## 2017-12-12 DIAGNOSIS — Z923 Personal history of irradiation: Secondary | ICD-10-CM | POA: Diagnosis not present

## 2017-12-12 DIAGNOSIS — R05 Cough: Secondary | ICD-10-CM | POA: Diagnosis not present

## 2017-12-12 DIAGNOSIS — M4854XA Collapsed vertebra, not elsewhere classified, thoracic region, initial encounter for fracture: Secondary | ICD-10-CM | POA: Diagnosis not present

## 2017-12-12 DIAGNOSIS — R0602 Shortness of breath: Secondary | ICD-10-CM | POA: Diagnosis not present

## 2017-12-12 DIAGNOSIS — Z5111 Encounter for antineoplastic chemotherapy: Secondary | ICD-10-CM | POA: Diagnosis not present

## 2017-12-12 DIAGNOSIS — G620 Drug-induced polyneuropathy: Secondary | ICD-10-CM

## 2017-12-12 DIAGNOSIS — Z79899 Other long term (current) drug therapy: Secondary | ICD-10-CM

## 2017-12-12 DIAGNOSIS — J181 Lobar pneumonia, unspecified organism: Secondary | ICD-10-CM | POA: Diagnosis not present

## 2017-12-12 DIAGNOSIS — T451X5S Adverse effect of antineoplastic and immunosuppressive drugs, sequela: Secondary | ICD-10-CM | POA: Diagnosis not present

## 2017-12-12 DIAGNOSIS — Z9484 Stem cells transplant status: Secondary | ICD-10-CM | POA: Diagnosis not present

## 2017-12-12 DIAGNOSIS — I252 Old myocardial infarction: Secondary | ICD-10-CM | POA: Diagnosis not present

## 2017-12-12 DIAGNOSIS — D61818 Other pancytopenia: Secondary | ICD-10-CM

## 2017-12-12 DIAGNOSIS — C9001 Multiple myeloma in remission: Secondary | ICD-10-CM

## 2017-12-12 DIAGNOSIS — C9002 Multiple myeloma in relapse: Secondary | ICD-10-CM

## 2017-12-12 DIAGNOSIS — C9000 Multiple myeloma not having achieved remission: Secondary | ICD-10-CM | POA: Diagnosis not present

## 2017-12-12 DIAGNOSIS — Z880 Allergy status to penicillin: Secondary | ICD-10-CM | POA: Insufficient documentation

## 2017-12-12 DIAGNOSIS — Z7952 Long term (current) use of systemic steroids: Secondary | ICD-10-CM | POA: Diagnosis not present

## 2017-12-12 DIAGNOSIS — J189 Pneumonia, unspecified organism: Secondary | ICD-10-CM | POA: Diagnosis not present

## 2017-12-12 DIAGNOSIS — Z8701 Personal history of pneumonia (recurrent): Secondary | ICD-10-CM

## 2017-12-12 DIAGNOSIS — Z881 Allergy status to other antibiotic agents status: Secondary | ICD-10-CM | POA: Diagnosis not present

## 2017-12-12 DIAGNOSIS — R5383 Other fatigue: Secondary | ICD-10-CM | POA: Diagnosis not present

## 2017-12-12 DIAGNOSIS — I2510 Atherosclerotic heart disease of native coronary artery without angina pectoris: Secondary | ICD-10-CM

## 2017-12-12 DIAGNOSIS — R079 Chest pain, unspecified: Secondary | ICD-10-CM | POA: Diagnosis not present

## 2017-12-12 DIAGNOSIS — R093 Abnormal sputum: Secondary | ICD-10-CM | POA: Diagnosis not present

## 2017-12-12 DIAGNOSIS — I509 Heart failure, unspecified: Secondary | ICD-10-CM

## 2017-12-12 DIAGNOSIS — I5042 Chronic combined systolic (congestive) and diastolic (congestive) heart failure: Secondary | ICD-10-CM | POA: Insufficient documentation

## 2017-12-12 DIAGNOSIS — R509 Fever, unspecified: Secondary | ICD-10-CM

## 2017-12-12 DIAGNOSIS — M810 Age-related osteoporosis without current pathological fracture: Secondary | ICD-10-CM | POA: Diagnosis not present

## 2017-12-12 DIAGNOSIS — Z66 Do not resuscitate: Secondary | ICD-10-CM | POA: Diagnosis not present

## 2017-12-12 DIAGNOSIS — D539 Nutritional anemia, unspecified: Secondary | ICD-10-CM | POA: Diagnosis not present

## 2017-12-12 DIAGNOSIS — G629 Polyneuropathy, unspecified: Secondary | ICD-10-CM | POA: Diagnosis not present

## 2017-12-12 DIAGNOSIS — R0902 Hypoxemia: Secondary | ICD-10-CM

## 2017-12-12 LAB — COMPREHENSIVE METABOLIC PANEL
ALBUMIN: 3.4 g/dL — AB (ref 3.5–5.0)
ALT: 33 U/L (ref 0–55)
ANION GAP: 11 (ref 3–11)
AST: 35 U/L — ABNORMAL HIGH (ref 5–34)
Alkaline Phosphatase: 114 U/L (ref 40–150)
BILIRUBIN TOTAL: 0.7 mg/dL (ref 0.2–1.2)
BUN: 15 mg/dL (ref 7–26)
CHLORIDE: 101 mmol/L (ref 98–109)
CO2: 22 mmol/L (ref 22–29)
Calcium: 9.5 mg/dL (ref 8.4–10.4)
Creatinine, Ser: 0.91 mg/dL (ref 0.60–1.10)
GFR calc Af Amer: >60 mL/min (ref >60)
GFR calc non Af Amer: >60 mL/min (ref >60)
Glucose, Bld: 117 mg/dL (ref 70–140)
POTASSIUM: 3.7 mmol/L (ref 3.5–5.1)
SODIUM: 134 mmol/L — AB (ref 136–145)
TOTAL PROTEIN: 6.8 g/dL (ref 6.4–8.3)

## 2017-12-12 LAB — EXPECTORATED SPUTUM ASSESSMENT W REFEX TO RESP CULTURE

## 2017-12-12 LAB — CBC WITH DIFFERENTIAL (CANCER CENTER ONLY)
BASOS ABS: 0 10*3/uL (ref 0.0–0.1)
BASOS PCT: 0 %
EOS ABS: 0 10*3/uL (ref 0.0–0.5)
EOS PCT: 0 %
HCT: 30.3 % — ABNORMAL LOW (ref 34.8–46.6)
Hemoglobin: 10.7 g/dL — ABNORMAL LOW (ref 11.6–15.9)
Lymphocytes Relative: 2 %
Lymphs Abs: 0.1 10*3/uL — ABNORMAL LOW (ref 0.9–3.3)
MCH: 36.3 pg — ABNORMAL HIGH (ref 25.1–34.0)
MCHC: 35.4 g/dL (ref 31.5–36.0)
MCV: 102.7 fL — ABNORMAL HIGH (ref 79.5–101.0)
MONO ABS: 0.5 10*3/uL (ref 0.1–0.9)
MONOS PCT: 7 %
Neutro Abs: 6.8 10*3/uL — ABNORMAL HIGH (ref 1.5–6.5)
Neutrophils Relative %: 91 %
PLATELETS: 191 10*3/uL (ref 145–400)
RBC: 2.95 MIL/uL — ABNORMAL LOW (ref 3.70–5.45)
RDW: 15.2 % — AB (ref 11.2–14.5)
WBC Count: 7.5 10*3/uL (ref 3.9–10.3)

## 2017-12-12 LAB — I-STAT CG4 LACTIC ACID, ED: LACTIC ACID, VENOUS: 0.89 mmol/L (ref 0.5–1.9)

## 2017-12-12 LAB — EXPECTORATED SPUTUM ASSESSMENT W GRAM STAIN, RFLX TO RESP C

## 2017-12-12 MED ORDER — DOXYCYCLINE HYCLATE 100 MG PO TABS
100.0000 mg | ORAL_TABLET | Freq: Two times a day (BID) | ORAL | Status: DC
  Administered 2017-12-13 – 2017-12-15 (×5): 100 mg via ORAL
  Filled 2017-12-12 (×5): qty 1

## 2017-12-12 MED ORDER — ACYCLOVIR 400 MG PO TABS
400.0000 mg | ORAL_TABLET | Freq: Two times a day (BID) | ORAL | Status: DC
  Administered 2017-12-12 – 2017-12-15 (×6): 400 mg via ORAL
  Filled 2017-12-12 (×6): qty 1

## 2017-12-12 MED ORDER — ATORVASTATIN CALCIUM 20 MG PO TABS
20.0000 mg | ORAL_TABLET | Freq: Every day | ORAL | Status: DC
Start: 2017-12-12 — End: 2017-12-15
  Administered 2017-12-12 – 2017-12-15 (×4): 20 mg via ORAL
  Filled 2017-12-12 (×4): qty 1

## 2017-12-12 MED ORDER — DOXYCYCLINE HYCLATE 100 MG PO TABS
100.0000 mg | ORAL_TABLET | Freq: Once | ORAL | Status: AC
  Administered 2017-12-12: 100 mg via ORAL
  Filled 2017-12-12: qty 1

## 2017-12-12 MED ORDER — CALCIUM CARBONATE 1250 (500 CA) MG PO TABS
625.0000 mg | ORAL_TABLET | Freq: Every day | ORAL | Status: DC
  Administered 2017-12-12 – 2017-12-15 (×4): 625 mg via ORAL
  Filled 2017-12-12 (×5): qty 1

## 2017-12-12 MED ORDER — ONDANSETRON HCL 4 MG PO TABS: 4.0000 mg | ORAL_TABLET | Freq: Two times a day (BID) | ORAL | Status: DC | PRN

## 2017-12-12 MED ORDER — FLUTICASONE PROPIONATE 50 MCG/ACT NA SUSP
2.0000 | Freq: Every day | NASAL | Status: DC | PRN
  Filled 2017-12-12: qty 16

## 2017-12-12 MED ORDER — LISINOPRIL 5 MG PO TABS
2.5000 mg | ORAL_TABLET | Freq: Every day | ORAL | Status: DC
  Administered 2017-12-13 – 2017-12-15 (×3): 2.5 mg via ORAL
  Filled 2017-12-12 (×3): qty 1

## 2017-12-12 MED ORDER — ENOXAPARIN SODIUM 40 MG/0.4ML ~~LOC~~ SOLN
40.0000 mg | SUBCUTANEOUS | Status: DC
  Filled 2017-12-12: qty 0.4

## 2017-12-12 MED ORDER — SODIUM CHLORIDE 0.9% FLUSH
10.0000 mL | Freq: Once | INTRAVENOUS | Status: DC
Start: 2017-12-12 — End: 2017-12-31
  Filled 2017-12-12: qty 10

## 2017-12-12 MED ORDER — SODIUM CHLORIDE 0.9 % IV SOLN
1.0000 g | INTRAVENOUS | Status: DC
  Administered 2017-12-13 – 2017-12-14 (×2): 1 g via INTRAVENOUS
  Filled 2017-12-12: qty 1
  Filled 2017-12-12: qty 10
  Filled 2017-12-12: qty 1

## 2017-12-12 MED ORDER — METOPROLOL SUCCINATE ER 25 MG PO TB24
25.0000 mg | ORAL_TABLET | Freq: Every day | ORAL | Status: DC
  Administered 2017-12-13 – 2017-12-15 (×3): 25 mg via ORAL
  Filled 2017-12-12 (×3): qty 1

## 2017-12-12 MED ORDER — SODIUM CHLORIDE 0.9 % IV SOLN
2.0000 g | Freq: Once | INTRAVENOUS | Status: AC
  Administered 2017-12-12: 2 g via INTRAVENOUS
  Filled 2017-12-12: qty 20

## 2017-12-12 MED ORDER — BENZONATATE 100 MG PO CAPS
200.0000 mg | ORAL_CAPSULE | Freq: Two times a day (BID) | ORAL | Status: DC | PRN
  Administered 2017-12-12 – 2017-12-13 (×2): 200 mg via ORAL
  Filled 2017-12-12 (×2): qty 2

## 2017-12-12 NOTE — ED Notes (Signed)
Bed: WA13 Expected date:  Expected time:  Means of arrival:  Comments: Cancer center 

## 2017-12-12 NOTE — ED Notes (Signed)
ED Provider at bedside. 

## 2017-12-12 NOTE — ED Provider Notes (Signed)
[facility] DEPT Provider Note   CSN: [id number] Arrival date & time: 12/12/17  1228     History   Chief Complaint Chief Complaint  Patient presents with  . Cough    HPI Shannon Rush is a 77 y.o. female.  Chief complaint is cough, fever, possible pneumonia.  HPI: 77 year old female.  History of multiple myeloma.  Follows with Dr. Annamaria Boots at the cancer center.  Treatment since 2013.  Had improvement of her "M bands", never achieved complete remission.  Has had ongoing treatment.  Last was 2 weeks ago.  Came in today for treatment.  Felt "sick" had fever and felt short of breath and was appropriately referred to the emergency room for treatment.  Patient just returned from [place] from a river cruise.  She states she and several of the people on board were sick and had a cough.  She does not have chest pain.  She did not notice feeling short of breath until today.  Temperature of 101 at cancer center.  Normally is very active.  States he typically rides her bike here for chemotherapy.  Past Medical History:  Diagnosis Date  . CAD (coronary artery disease)    a. inf STEMI (in [place] >>> [facility]) >>> LHC (8/15- [place]):  pRCA 60%, mid to dist RCA 50% >>> med rx  . Cord compression ([place]) 07/13/12   MRI- diffuse myeloma involvement of T-L spine  . History of radiation therapy 07/13/12-07/27/12   spinal cord compression T3-T10,left scapula  . Hx of echocardiogram    Echo (03/02/14-done at [facility]):  Normal LVEF without significant regional wall motion abnormalities. Mild   . Multiple myeloma ([place]) 07/01/2012  . OSTEOPOROSIS 06/11/2010   Multiple compression fractures; and spontaneous fracture of sternum Qualifier: Diagnosis of  By: [name], [name]     . Thoracic kyphosis 07/13/12   per MRI scan  . Unspecified deficiency anemia     Patient Active Problem List   Diagnosis Date Noted  . Port-A-Cath in  place 11/21/2017  . Goals of care, counseling/discussion 02/28/2017  . Edema of right lower extremity 12/04/2016  . Non-ischemic cardiomyopathy ([place]) 12/04/2016  . CHF (congestive heart failure) ([place]) 04/26/2016  . Antineoplastic chemotherapy induced anemia 03/28/2016  . Shortness of breath   . Acute on chronic combined systolic and diastolic CHF, NYHA class 3 ([place])   . Diastolic dysfunction-grade 2 03/27/2016  . Pleural effusion   . Hypoxia   . Cardiomyopathy-drop in EF to 45-50% etiology not yet determined 03/25/2016  . Patient on antineoplastic chemotherapy regimen   . Adrenocortical insufficiency ([place])   . Drug-induced neutropenia ([place]) 03/11/2016  . HCAP (healthcare-associated pneumonia) 03/10/2016  . Acute respiratory failure ([place]) 03/10/2016  . Left ear pain 02/18/2016  . Corn of foot 11/04/2014  . Protein-calorie malnutrition ([place]) 09/23/2014  . Edema of both legs 08/17/2014  . Pancytopenia ([place]) 08/10/2014  . Sepsis ([place]) 08/09/2014  . Febrile neutropenia ([place]) 08/08/2014  . CAD -50-60% RCA Aug 2015 03/11/2014  . History of STEMI-Aug 2015- secondary to thrombotic state from chemo 03/10/2014  . Unspecified vitamin D deficiency 05/05/2013  . History of peripheral stem cell transplant ([place]) 03/05/2013  . History of organ or tissue transplant 03/05/2013  . Swelling, mass, or lump in chest 12/24/2012  . Multiple myeloma ([place]) 07/01/2012  . Sciatica of left side 06/03/2011  . Osteoporosis 06/11/2010  . Anemia, macrocytic 06/02/2009  . DJD, UNSPECIFIED 09/11/2006  Past Surgical History:  Procedure Laterality Date  . APPENDECTOMY    . CESAREAN SECTION     x2   . COLONOSCOPY  2007   neg with Dr. [name]  . ELBOW SURGERY    . HIP SURGERY  2009   left  . TUBAL LIGATION       OB History   None      Home Medications    Prior to Admission medications   Medication Sig Start Date End Date Taking? Authorizing Provider  acetaminophen (TYLENOL) 500 MG tablet Take 500  mg by mouth every 6 (six) hours as needed for moderate pain (pain). Reported on 07/20/2015   Yes [provider]  acyclovir (ZOVIRAX) 400 MG tablet Take 1 tablet (400 mg total) by mouth 2 (two) times daily. 04/24/17  Yes [name], MD  atorvastatin (LIPITOR) 20 MG tablet Take 1 tablet (20 mg total) by mouth daily. 08/06/17 08/01/18 Yes [name], MD  calcium carbonate (OS-CAL) 600 MG TABS tablet Take 600 mg by mouth daily.   Yes [provider]  dexamethasone (DECADRON) 4 MG tablet Take 5 tab the day before chemo, every 2 weeks 09/26/17  Yes [name], MD  doxycycline (VIBRA-TABS) 100 MG tablet Take 100 mg by mouth daily.  12/11/17  Yes [provider]  lisinopril (PRINIVIL,ZESTRIL) 2.5 MG tablet Take 1 tablet (2.5 mg total) by mouth daily. 01/23/17  Yes [name], MD  metoprolol succinate (TOPROL-XL) 25 MG 24 hr tablet Take 1 tablet (25 mg total) by mouth daily. 12/12/17  Yes [name], MD  nitroGLYCERIN (NITROSTAT) 0.4 MG SL tablet Place under the tongue as needed. Reported on 07/20/2015 03/03/14  Yes [provider]  ondansetron (ZOFRAN) 8 MG tablet Take 1 tablet (8 mg total) by mouth 2 (two) times daily as needed (Nausea or vomiting). 03/07/16  Yes [name], MD  PROAIR HFA 108 (90 BASE) MCG/ACT inhaler Inhale 2 puffs into the lungs every 6 (six) hours as needed. 09/14/14  Yes [provider]  prochlorperazine (COMPAZINE) 10 MG tablet Take 1 tablet (10 mg total) by mouth every 6 (six) hours as needed (Nausea or vomiting). 03/07/16  Yes [name], MD  fluticasone [facility]) 50 MCG/ACT nasal spray Place 2 sprays into both nostrils daily. Patient taking differently: Place 2 sprays into both nostrils daily as needed.  02/15/16   Rumley, [place] N, DO  furosemide (LASIX) 20 MG tablet Take 20 mg by mouth daily as needed for fluid or edema.     [provider]    Family History Family History  Problem Relation Age of Onset  . Glaucoma  Mother   . Heart disease Mother   . Peripheral vascular disease Mother   . Raynaud syndrome Mother   . Heart disease Father   . Heart failure Father   . Glaucoma Father   . Cancer Brother        stage IV prostate CA  . Raynaud syndrome Daughter   . Raynaud syndrome Daughter     Social History Social History   Tobacco Use  . Smoking status: Never Smoker  . Smokeless tobacco: Never Used  Substance Use Topics  . Alcohol use: Yes    Alcohol/week: 3.5 oz    Types: 7 Standard drinks or equivalent per week    Comment: 1 glass of wine or beer 3-5 days per week  . Drug use: No     Allergies   Zosyn [piperacillin sod-tazobactam so]; Levaquin [  levofloxacin in d5w]; Other; Piperacillin-tazobactam in dex; Zithromax [azithromycin]; and Ciprofloxacin   Review of Systems Review of Systems  Constitutional: Positive for fatigue and fever. Negative for appetite change, chills and diaphoresis.  HENT: Negative for mouth sores, sore throat and trouble swallowing.   Eyes: Negative for visual disturbance.  Respiratory: Positive for cough and shortness of breath. Negative for chest tightness and wheezing.   Cardiovascular: Negative for chest pain.  Gastrointestinal: Negative for abdominal distention, abdominal pain, diarrhea, nausea and vomiting.  Endocrine: Negative for polydipsia, polyphagia and polyuria.  Genitourinary: Negative for dysuria, frequency and hematuria.  Musculoskeletal: Negative for gait problem.  Skin: Negative for color change, pallor and rash.  Neurological: Negative for dizziness, syncope, light-headedness and headaches.  Hematological: Does not bruise/bleed easily.  Psychiatric/Behavioral: Negative for behavioral problems and confusion.     Physical Exam Updated Vital Signs BP 113/74 (BP Location: Left Arm)   Pulse 85   Resp 20   Ht 5' 6.5" (1.689 m)   Wt 54 kg (119 lb)   SpO2 (S) 94%   BMI 18.92 kg/m   Physical Exam  Constitutional: She is oriented to  person, place, and time. She appears well-developed and well-nourished. No distress.  HENT:  Head: Normocephalic.  Eyes: Pupils are equal, round, and reactive to light. Conjunctivae are normal. No scleral icterus.  Neck: Normal range of motion. Neck supple. No thyromegaly present.  Cardiovascular: Normal rate and regular rhythm. Exam reveals no gallop and no friction rub.  No murmur heard. Pulmonary/Chest: Effort normal. No respiratory distress. She has no wheezes. She has no rales.  Crackles bilateral bases left greater than right.  Congested cough  Abdominal: Soft. Bowel sounds are normal. She exhibits no distension. There is no tenderness. There is no rebound.  Musculoskeletal: Normal range of motion.  Neurological: She is alert and oriented to person, place, and time.  Skin: Skin is warm and dry. No rash noted.  Psychiatric: She has a normal mood and affect. Her behavior is normal.     ED Treatments / Results  Labs (all labs ordered are listed, but only abnormal results are displayed) Labs Reviewed  CULTURE, BLOOD (ROUTINE X 2)  CULTURE, BLOOD (ROUTINE X 2)  I-STAT CG4 LACTIC ACID, ED    EKG None  Radiology Dg Chest 2 View  Result Date: 12/12/2017 CLINICAL DATA:  77 year old female with cough and chest pain for 6 days. EXAM: CHEST - 2 VIEW COMPARISON:  06/28/2016 and earlier. FINDINGS: Stable lung volumes. Streaky in confluent medial left lung base opacity on the frontal view is correlated with vague lower lung density on the lateral. Small if any associated pleural effusion. No pneumothorax or pulmonary edema. Lung parenchyma elsewhere appears stable. Stable cardiomegaly. Other mediastinal contours are within normal limits. Visualized tracheal air column is within normal limits. Extensive chronic skeletal deformities in the chest including chronically displaced sternal fracture, chronic rib and left clavicle fractures, and widespread thoracic compression fractures with  exaggerated thoracic kyphosis. No acute osseous abnormality identified. Paucity bowel gas in the upper abdomen. IMPRESSION: 1. New abnormal left lower lung opacity is nonspecific but most suggestive of pneumonia. Trace if any associated pleural effusion. If the patient shows clinical signs of infection then followup PA and lateral chest X-ray is recommended in 3-4 weeks following trial of antibiotic therapy to ensure resolution and exclude underlying malignancy. Otherwise chest CT could further characterize (IV contrast preferred but noncontrast may suffice). 2. Otherwise stable chest including numerous chronic thoracic skeletal deformities. Electronically Signed  By: Genevie Ann M.D.   On: 12/12/2017 14:02    Procedures Procedures (including critical care time)  Medications Ordered in ED Medications  cefTRIAXone (ROCEPHIN) 2 g in sodium chloride 0.9 % 100 mL IVPB (2 g Intravenous New Bag/Given 12/12/17 1501)  doxycycline (VIBRA-TABS) tablet 100 mg (100 mg Oral Given 12/12/17 1501)     Initial Impression / Assessment and Plan / ED Course  I have reviewed the triage vital signs and the nursing notes.  Pertinent labs & imaging results that were available during my care of the patient were reviewed by me and considered in my medical decision making (see chart for details).    Hypoxemic.  Not septic.  Normal lactate.  Not tachycardic.  However hypoxemic requiring 3 L.  Allergies reviewed.  Given Rocephin doxycycline.  Will admit to triad  Final Clinical Impressions(s) / ED Diagnoses   Final diagnoses:  Community acquired pneumonia of left lower lobe of lung [facility])    ED Discharge Orders    None       Tanna Furry, MD 12/12/17 1527

## 2017-12-12 NOTE — Telephone Encounter (Signed)
No los 5/31

## 2017-12-12 NOTE — ED Notes (Signed)
Coming from cancer center-states got back from a cruise last night-states seen today, SpO2 85%, rhonchi-possible PNA

## 2017-12-12 NOTE — H&P (Signed)
History and Physical    JEFFIFER RABOLD CHY:[id number] DOB: 07/18/1940 DOA: 12/12/2017  PCP: [name], MD  Patient coming from: Home  Chief Complaint: Dyspnea/cough/fever  HPI: Shannon Rush is a 77 y.o. female with medical history significant of multiple myeloma status post stem cell transplant, osteoporosis, macrocytic anemia, combined heart failure, febrile neutropenia, STEMI. About 6 days ago, while on a cruise in [place], she had worsening fatigue and malaise, She used an OTC medication called Cold RX that helped with symptoms. She states that she bought a thermometer which was significant for a temp of 101 degrees Fahrenheit. Yesterday, she rechecked her temperature and it was 101 degrees Fahrenheit again. She called the after hours line and was prescribed Levaquin and was told to follow-up with her oncologist. At her appointment, she was found to have hypoxia and low grade fever and was sent to the ED for evaluation.  ED Course: Vitals: Afebrile, normal pulse, normal respirations, normotensive, on 2L nasal cannula Labs: Hemoglobin of 10.7, neutrophils of 6800, lactic acid of 0.89 Imaging: Chest x-ray significant for left lower lung opacity concerning for pneumonia Medications/Course: Ceftriaxone and doxycycline given  Review of Systems: Review of Systems  Constitutional: Positive for fever and malaise/fatigue. Negative for chills.  Respiratory: Positive for cough, sputum production and shortness of breath.   Cardiovascular: Negative for chest pain.  Gastrointestinal: Positive for nausea. Negative for abdominal pain, constipation, diarrhea and vomiting.  Neurological: Positive for weakness. Negative for focal weakness.  All other systems reviewed and are negative.   Past Medical History:  Diagnosis Date  . CAD (coronary artery disease)    a. inf STEMI (in [place] >>> [facility]) >>> LHC (8/15- [place]):  pRCA 60%, mid to dist RCA 50% >>> med rx  . Cord  compression ([place]) 07/13/12   MRI- diffuse myeloma involvement of T-L spine  . History of radiation therapy 07/13/12-07/27/12   spinal cord compression T3-T10,left scapula  . Hx of echocardiogram    Echo (03/02/14-done at [facility]):  Normal LVEF without significant regional wall motion abnormalities. Mild   . Multiple myeloma ([place]) 07/01/2012  . OSTEOPOROSIS 06/11/2010   Multiple compression fractures; and spontaneous fracture of sternum Qualifier: Diagnosis of  By: [name], [name]     . Thoracic kyphosis 07/13/12   per MRI scan  . Unspecified deficiency anemia     Past Surgical History:  Procedure Laterality Date  . APPENDECTOMY    . CESAREAN SECTION     x2   . COLONOSCOPY  2007   neg with Dr. [name]  . ELBOW SURGERY    . HIP SURGERY  2009   left  . TUBAL LIGATION       reports that she has never smoked. She has never used smokeless tobacco. She reports that she drinks about 3.5 oz of alcohol per week. She reports that she does not use drugs.  Allergies  Allergen Reactions  . Zosyn [Piperacillin Sod-Tazobactam So] Rash    Itching, rash and swelling of extremities  . Levaquin [Levofloxacin In D5w] Rash  . Other Rash  . Piperacillin-Tazobactam In Dex Rash  . Zithromax [Azithromycin]     Full body rash  . Ciprofloxacin Rash    Throwing up and stomach pain     Family History  Problem Relation Age of Onset  . Glaucoma Mother   . Heart disease Mother   . Peripheral vascular disease Mother   . Raynaud syndrome Mother   .  Heart disease Father   . Heart failure Father   . Glaucoma Father   . Cancer Brother        stage IV prostate CA  . Raynaud syndrome Daughter   . Raynaud syndrome Daughter     Prior to Admission medications   Medication Sig Start Date End Date Taking? Authorizing Provider  acetaminophen (TYLENOL) 500 MG tablet Take 500 mg by mouth every 6 (six) hours as needed for moderate pain (pain). Reported on 07/20/2015   Yes [provider]  acyclovir (ZOVIRAX) 400 MG tablet Take 1 tablet (400 mg total) by mouth 2 (two) times daily. 04/24/17  Yes [name], MD  atorvastatin (LIPITOR) 20 MG tablet Take 1 tablet (20 mg total) by mouth daily. 08/06/17 08/01/18 Yes [name], MD  calcium carbonate (OS-CAL) 600 MG TABS tablet Take 600 mg by mouth daily.   Yes [provider]  dexamethasone (DECADRON) 4 MG tablet Take 5 tab the day before chemo, every 2 weeks 09/26/17  Yes [name], MD  doxycycline (VIBRA-TABS) 100 MG tablet Take 100 mg by mouth daily.  12/11/17  Yes [provider]  lisinopril (PRINIVIL,ZESTRIL) 2.5 MG tablet Take 1 tablet (2.5 mg total) by mouth daily. 01/23/17  Yes [name], MD  metoprolol succinate (TOPROL-XL) 25 MG 24 hr tablet Take 1 tablet (25 mg total) by mouth daily. 12/12/17  Yes [name], MD  nitroGLYCERIN (NITROSTAT) 0.4 MG SL tablet Place under the tongue as needed. Reported on 07/20/2015 03/03/14  Yes [provider]  ondansetron (ZOFRAN) 8 MG tablet Take 1 tablet (8 mg total) by mouth 2 (two) times daily as needed (Nausea or vomiting). 03/07/16  Yes [name], MD  PROAIR HFA 108 (90 BASE) MCG/ACT inhaler Inhale 2 puffs into the lungs every 6 (six) hours as needed. 09/14/14  Yes [provider]  prochlorperazine (COMPAZINE) 10 MG tablet Take 1 tablet (10 mg total) by mouth every 6 (six) hours as needed (Nausea or vomiting). 03/07/16  Yes [name], MD  fluticasone [facility]) 50 MCG/ACT nasal spray Place 2 sprays into both nostrils daily. Patient taking differently: Place 2 sprays into both nostrils daily as needed.  02/15/16   Rumley, [place] N, DO  furosemide (LASIX) 20 MG tablet Take 20 mg by mouth daily as needed for fluid or edema.     [provider]    Physical Exam: Vitals:   12/12/17 1405 12/12/17 1430 12/12/17 1500 12/12/17 1530  BP: 117/69 112/72 113/74 112/70  Pulse: 87 83 85 87  Resp: 20 (!) 21 20 (!) 22  SpO2: (S) 95%  (S) 94% (S) 94% (S) 95%  Weight:      Height:         Constitutional: NAD, calm, comfortable Eyes: PERRL, lids and conjunctivae normal ENMT: Mucous membranes are moist. Posterior pharynx clear of any exudate or lesions. Neck: normal, supple, no masses, no thyromegaly Respiratory: Diffuse crackles. No wheezing. to auscultation bilaterally, Normal respiratory effort. No accessory muscle use.  Cardiovascular: Regular rate and rhythm, no murmurs / rubs / gallops. No extremity edema. 2+ pedal pulses. Abdomen: no tenderness, no masses palpated. No hepatosplenomegaly. Bowel sounds positive.  Musculoskeletal: no clubbing / cyanosis. No joint deformity upper and lower extremities. Good ROM, no contractures. Normal muscle tone. Kyphosis Skin: no rashes, lesions, ulcers. No induration Neurologic: CN 2-12 grossly intact. Sensation intact. Strength 5/5 in all 4.  Psychiatric: Normal judgment and insight. Alert and oriented x 3. Normal mood.  Labs on Admission: I have personally reviewed following labs and imaging studies  CBC: Recent Labs  Lab 12/12/17 1103  WBC 7.5  NEUTROABS 6.8*  HGB 10.7*  HCT 30.3*  MCV 102.7*  PLT 403   Basic Metabolic Panel: Recent Labs  Lab 12/12/17 1103  NA 134*  K 3.7  CL 101  CO2 22  GLUCOSE 117  BUN 15  CREATININE 0.91  CALCIUM 9.5   GFR: Estimated Creatinine Clearance: 45.5 mL/min (by C-G formula based on SCr of 0.91 mg/dL). Liver Function Tests: Recent Labs  Lab 12/12/17 1103  AST 35*  ALT 33  ALKPHOS 114  BILITOT 0.7  PROT 6.8  ALBUMIN 3.4*   Urine analysis:    Component Value Date/Time   COLORURINE AMBER (A) 03/16/2016 1645   APPEARANCEUR CLOUDY (A) 03/16/2016 1645   LABSPEC 1.029 03/16/2016 1645   LABSPEC 1.015 08/20/2012 1124   PHURINE 6.0 03/16/2016 1645   GLUCOSEU 100 (A) 03/16/2016 1645   GLUCOSEU Negative 08/20/2012 1124   HGBUR MODERATE (A) 03/16/2016 1645   HGBUR negative 10/22/2007 1336   BILIRUBINUR NEGATIVE  03/16/2016 1645   BILIRUBINUR Negative 08/20/2012 1124   KETONESUR NEGATIVE 03/16/2016 1645   PROTEINUR 100 (A) 03/16/2016 1645   UROBILINOGEN 0.2 09/14/2014 2100   UROBILINOGEN 0.2 08/20/2012 1124   NITRITE NEGATIVE 03/16/2016 1645   LEUKOCYTESUR NEGATIVE 03/16/2016 1645   LEUKOCYTESUR Small 08/20/2012 1124   Radiological Exams on Admission: Dg Chest 2 View  Result Date: 12/12/2017 CLINICAL DATA:  77 year old female with cough and chest pain for 6 days. EXAM: CHEST - 2 VIEW COMPARISON:  06/28/2016 and earlier. FINDINGS: Stable lung volumes. Streaky in confluent medial left lung base opacity on the frontal view is correlated with vague lower lung density on the lateral. Small if any associated pleural effusion. No pneumothorax or pulmonary edema. Lung parenchyma elsewhere appears stable. Stable cardiomegaly. Other mediastinal contours are within normal limits. Visualized tracheal air column is within normal limits. Extensive chronic skeletal deformities in the chest including chronically displaced sternal fracture, chronic rib and left clavicle fractures, and widespread thoracic compression fractures with exaggerated thoracic kyphosis. No acute osseous abnormality identified. Paucity bowel gas in the upper abdomen. IMPRESSION: 1. New abnormal left lower lung opacity is nonspecific but most suggestive of pneumonia. Trace if any associated pleural effusion. If the patient shows clinical signs of infection then followup PA and lateral chest X-ray is recommended in 3-4 weeks following trial of antibiotic therapy to ensure resolution and exclude underlying malignancy. Otherwise chest CT could further characterize (IV contrast preferred but noncontrast may suffice). 2. Otherwise stable chest including numerous chronic thoracic skeletal deformities. Electronically Signed   By: Genevie Ann M.D.   On: 12/12/2017 14:02    Assessment/Plan Active Problems:   Multiple myeloma (HCC)   CAD -50-60% RCA Aug 2015    Left lower lobe pneumonia ([place])   Left lower lobe pneumonia Patient with associated hypoxemia.  Overall, however, stable. Patient is very active at baseline. -Continue ceftriaxone and doxycycline -Sputum Gram stain and culture, urine strep and Legionella antigen  Acute respiratory failure with hypoxia Secondary to pneumonia. -wean to room air as able  Multiple myeloma Patient is underwent palliative radiation and chemotherapy.  Also status post bone marrow transplant at [facility] but relapsed.  Currently followed by Dr. Burr Medico as an outpatient. Currently on daratumumab/dexamethasone q2 weeks.  Pancytopenia Per oncology notes, secondary to daratumumab.  Hemoglobin is stable.  Chronic combined systolic and diastolic heart failure History of STEMI Last  EF improved to 55-60% with evidence of grade 1 diastolic dysfunction. Followed by Dr. Meda Coffee as an outpatient. Takes lasix prn. -Continue metoprolol and lisinopril   DVT prophylaxis: Lovenox Code Status: DNR Family Communication: None at bedside Disposition Plan: Discharge home tomorrow if able to wean off of oxygen Consults called: None Admission status: Observation, medical floor   Cordelia Poche, MD [facility] Pager [phone]  If 7PM-7AM, please contact night-coverage [email or web address] Password [email or web address]  12/12/2017, 4:14 PM

## 2017-12-12 NOTE — ED Triage Notes (Signed)
Pt did recently get back from [place].

## 2017-12-12 NOTE — ED Triage Notes (Signed)
Pt comes from cancer center. Pt was sent to ED due to cough and elevated temp of 100.00 F oral. Pt is AOx4 and ambulatory.

## 2017-12-12 NOTE — ED Notes (Signed)
Patient transported to X-ray 

## 2017-12-12 NOTE — ED Notes (Signed)
ED TO INPATIENT HANDOFF REPORT  Name/Age/Gender Shannon Rush 77 y.o. female  Code Status Code Status History    Date Active Date Inactive Code Status Order ID Comments User Context   03/10/2016 1341 03/29/2016 1928 Full Code [id number]  Gherghe, Costin M, MD ED   09/14/2014 2344 09/19/2014 1820 Full Code [id number]  Doutova, Anastassia, MD Inpatient   08/09/2014 0106 08/13/2014 1925 Full Code [id number]  Niu, Xilin, MD Inpatient    Advance Directive Documentation     Most Recent Value  Type of Advance Directive  Healthcare Power of Attorney, Living will  Pre-existing out of facility DNR order (yellow form or pink MOST form)  -  "MOST" Form in Place?  -      Home/SNF/Other Home  Chief Complaint hypoxia  Level of Care/Admitting Diagnosis ED Disposition    ED Disposition Condition Comment   Admit  Hospital Area: [facility] [[id number]]  Level of Care: Med-Surg [16]  Diagnosis: Left lower lobe pneumonia (HCC) [360816]  Admitting Physician: NETTEY, RALPH A [[id number]]  Attending Physician: NETTEY, RALPH A [[id number]]  PT Class (Do Not Modify): Observation [104]  PT Acc Code (Do Not Modify): Observation [10022]       Medical History Past Medical History:  Diagnosis Date  . CAD (coronary artery disease)    a. inf STEMI (in [place] >>> [facility]) >>> LHC (8/15- [facility]):  pRCA 60%, mid to dist RCA 50% >>> med rx  . Cord compression (HCC) 07/13/12   MRI- diffuse myeloma involvement of T-L spine  . History of radiation therapy 07/13/12-07/27/12   spinal cord compression T3-T10,left scapula  . Hx of echocardiogram    Echo (03/02/14-done at [facility]):  Normal LVEF without significant regional wall motion abnormalities. Mild   . Multiple myeloma (HCC) 07/01/2012  . OSTEOPOROSIS 06/11/2010   Multiple compression fractures; and spontaneous fracture of sternum Qualifier: Diagnosis of  By: Saxon NP, Susan     . Thoracic kyphosis  07/13/12   per MRI scan  . Unspecified deficiency anemia     Allergies Allergies  Allergen Reactions  . Zosyn [Piperacillin Sod-Tazobactam So] Rash    Itching, rash and swelling of extremities  . Levaquin [Levofloxacin In D5w] Rash  . Other Rash  . Piperacillin-Tazobactam In Dex Rash  . Zithromax [Azithromycin]     Full body rash  . Ciprofloxacin Rash    Throwing up and stomach pain     IV Location/Drains/Wounds Patient Lines/Drains/Airways Status   Active Line/Drains/Airways    Name:   Placement date:   Placement time:   Site:   Days:   Peripheral IV 12/12/17 Right;Upper Arm   12/12/17    1417    Arm   less than 1          Labs/Imaging Results for orders placed or performed during the hospital encounter of 12/12/17 (from the past 48 hour(s))  I-Stat CG4 Lactic Acid, ED     Status: None   Collection Time: 12/12/17  1:49 PM  Result Value Ref Range   Lactic Acid, Venous 0.89 0.5 - 1.9 mmol/L   *Note: Due to a large number of results and/or encounters for the requested time period, some results have not been displayed. A complete set of results can be found in Results Review.   Dg Chest 2 View  Result Date: 12/12/2017 CLINICAL DATA:  77-year-old female with cough and chest pain for 6 days. EXAM: CHEST - 2 VIEW COMPARISON:    06/28/2016 and earlier. FINDINGS: Stable lung volumes. Streaky in confluent medial left lung base opacity on the frontal view is correlated with vague lower lung density on the lateral. Small if any associated pleural effusion. No pneumothorax or pulmonary edema. Lung parenchyma elsewhere appears stable. Stable cardiomegaly. Other mediastinal contours are within normal limits. Visualized tracheal air column is within normal limits. Extensive chronic skeletal deformities in the chest including chronically displaced sternal fracture, chronic rib and left clavicle fractures, and widespread thoracic compression fractures with exaggerated thoracic kyphosis. No acute  osseous abnormality identified. Paucity bowel gas in the upper abdomen. IMPRESSION: 1. New abnormal left lower lung opacity is nonspecific but most suggestive of pneumonia. Trace if any associated pleural effusion. If the patient shows clinical signs of infection then followup PA and lateral chest X-ray is recommended in 3-4 weeks following trial of antibiotic therapy to ensure resolution and exclude underlying malignancy. Otherwise chest CT could further characterize (IV contrast preferred but noncontrast may suffice). 2. Otherwise stable chest including numerous chronic thoracic skeletal deformities. Electronically Signed   By: Genevie Ann M.D.   On: 12/12/2017 14:02    Pending Labs Unresulted Labs (From admission, onward)   Start     Ordered   12/12/17 1311  Culture, blood (Routine X 2) w Reflex to ID Panel  BLOOD CULTURE X 2,   STAT     12/12/17 1311   Signed and Held  Culture, sputum-assessment  Once,   R     Signed and Held   Signed and Held  Gram stain  Once,   R     Signed and Held   Signed and Held  HIV antibody (Routine Screening)  Tomorrow morning,   R     Signed and Held   Signed and Held  Strep pneumoniae urinary antigen  Once,   R     Signed and Held   Signed and Held  Creatinine, serum  (enoxaparin (LOVENOX)    CrCl >/= 30 ml/min)  Weekly,   R    Comments:  while on enoxaparin therapy    Signed and Held   Signed and Held  Basic metabolic panel  Tomorrow morning,   R     Signed and Held   Signed and Held  CBC  Tomorrow morning,   R     Signed and Held      Vitals/Pain Today's Vitals   12/12/17 1600 12/12/17 1630 12/12/17 1730 12/12/17 1800  BP: 110/66 111/69 112/71 110/72  Pulse: 80 83 76 77  Resp: (!) 21 (!) _0 SpO2: (S) 91% (S) 94% (S) 93% 95%  Weight:      Height:      PainSc:        Isolation Precautions No active isolations  Medications Medications  cefTRIAXone (ROCEPHIN) 2 g in sodium chloride 0.9 % 100 mL IVPB (0 g Intravenous Stopped 12/12/17 1546)   doxycycline (VIBRA-TABS) tablet 100 mg (100 mg Oral Given 12/12/17 1501)    Mobility walks

## 2017-12-12 NOTE — ED Notes (Signed)
Report given to Romie Minus, 6E @ [phone]

## 2017-12-13 DIAGNOSIS — I5042 Chronic combined systolic (congestive) and diastolic (congestive) heart failure: Secondary | ICD-10-CM | POA: Diagnosis not present

## 2017-12-13 DIAGNOSIS — I2510 Atherosclerotic heart disease of native coronary artery without angina pectoris: Secondary | ICD-10-CM | POA: Diagnosis not present

## 2017-12-13 DIAGNOSIS — R0902 Hypoxemia: Secondary | ICD-10-CM | POA: Diagnosis not present

## 2017-12-13 DIAGNOSIS — C9001 Multiple myeloma in remission: Secondary | ICD-10-CM

## 2017-12-13 DIAGNOSIS — Z881 Allergy status to other antibiotic agents status: Secondary | ICD-10-CM | POA: Diagnosis not present

## 2017-12-13 DIAGNOSIS — J181 Lobar pneumonia, unspecified organism: Principal | ICD-10-CM

## 2017-12-13 LAB — HIV ANTIBODY (ROUTINE TESTING W REFLEX): HIV SCREEN 4TH GENERATION: NONREACTIVE

## 2017-12-13 LAB — BASIC METABOLIC PANEL
Anion gap: 11 (ref 5–15)
BUN: 19 mg/dL (ref 6–20)
CALCIUM: 9.1 mg/dL (ref 8.9–10.3)
CHLORIDE: 106 mmol/L (ref 101–111)
CO2: 22 mmol/L (ref 22–32)
CREATININE: 0.74 mg/dL (ref 0.44–1.00)
GFR calc Af Amer: >60 mL/min (ref >60)
GFR calc non Af Amer: >60 mL/min (ref >60)
GLUCOSE: 153 mg/dL — AB (ref 65–99)
Potassium: 3.7 mmol/L (ref 3.5–5.1)
Sodium: 139 mmol/L (ref 135–145)

## 2017-12-13 LAB — CBC
HCT: 28.5 % — ABNORMAL LOW (ref 36.0–46.0)
Hemoglobin: 9.7 g/dL — ABNORMAL LOW (ref 12.0–15.0)
MCH: 34.8 pg — AB (ref 26.0–34.0)
MCHC: 34 g/dL (ref 30.0–36.0)
MCV: 102.2 fL — AB (ref 78.0–100.0)
PLATELETS: 183 10*3/uL (ref 150–400)
RBC: 2.79 MIL/uL — ABNORMAL LOW (ref 3.87–5.11)
RDW: 15 % (ref 11.5–15.5)
WBC: 5.1 10*3/uL (ref 4.0–10.5)

## 2017-12-13 LAB — STREP PNEUMONIAE URINARY ANTIGEN: Strep Pneumo Urinary Antigen: NEGATIVE

## 2017-12-13 MED ORDER — GUAIFENESIN-DM 100-10 MG/5ML PO SYRP: 5.0000 mL | ORAL_SOLUTION | ORAL | Status: DC | PRN

## 2017-12-13 MED ORDER — IPRATROPIUM-ALBUTEROL 0.5-2.5 (3) MG/3ML IN SOLN
3.0000 mL | Freq: Three times a day (TID) | RESPIRATORY_TRACT | Status: DC
  Administered 2017-12-13 – 2017-12-14 (×2): 3 mL via RESPIRATORY_TRACT
  Filled 2017-12-13 (×2): qty 3

## 2017-12-13 MED ORDER — GUAIFENESIN ER 600 MG PO TB12
1200.0000 mg | ORAL_TABLET | Freq: Two times a day (BID) | ORAL | Status: DC
  Administered 2017-12-13 – 2017-12-15 (×5): 1200 mg via ORAL
  Filled 2017-12-13 (×5): qty 2

## 2017-12-13 MED ORDER — IPRATROPIUM-ALBUTEROL 0.5-2.5 (3) MG/3ML IN SOLN
3.0000 mL | Freq: Four times a day (QID) | RESPIRATORY_TRACT | Status: DC
  Administered 2017-12-13: 3 mL via RESPIRATORY_TRACT
  Filled 2017-12-13: qty 3

## 2017-12-13 NOTE — Progress Notes (Signed)
Shannon Rush   DOB:February 03, 1941   JK#:[id number]   YKD#:[id number]   Interval history: 77 y/o [place] woman with a history of myeloma dating back to Nov 2013, with multiple lytic lesions at presentation, s/p palliative radiation, s/p induction therapy with bortezomib, lenalidomide and dexamethasone followed by autologous stem cell transplant at [facility] Aug 2014, subsequently relapsing and treated with a variety of agents, most recently every-2-week dexamethasone/ daratumumab, with ongoing disease control. She returned from a [place] with cough, SOB, fever and purulent sputum starting a few days before she saw Dr Burr Medico 12/12/2017. She had called our office the night before and started empirically on doxycycline. The patient took dexamethasone 05/31 but her daratumumab was help (most recent dose was 05/10) and she was referred to the ED where CXR showed LLL pneumonia leading to admission   Subjective:  Fells "much better," thinks her breathing is already "OK," denies current cough; had diarrhea yesterday but no BMs today; no h/a, N/V, visual changes or recent falls. No dysuria or frequency. No rash. No bleeding. No family in room   Objective: older White woman examined in bed Vitals:   12/12/17 2147 12/13/17 0604  BP: 104/66 115/68  Pulse: 87 78  Resp: 18 15  Temp: 97.9 F (36.6 C) 98 F (36.7 C)  SpO2: 91% 90%    Body mass index is 18.92 kg/m.  Intake/Output Summary (Last 24 hours) at 12/13/2017 0919 Last data filed at 12/12/2017 2231 Gross per 24 hour  Intake 340 ml  Output -  Net 340 ml     Sclerae unicteric  Oropharynx clear, slightly dry  No cervical or supraclavicular adenopathy  Lungs no rales or wheezes--auscultated anterolaterally  Heart regular rate and rhythm  Abdomen soft, +BS  Neuro nonfocal, well-oriented, friendly affect  Breast exam: deferred  CBG (last 3)  No results for input(s): GLUCAP in the last 72 hours.   Labs:  Lab Results  Component Value  Date   WBC 5.1 12/13/2017   HGB 9.7 (L) 12/13/2017   HCT 28.5 (L) 12/13/2017   MCV 102.2 (H) 12/13/2017   PLT 183 12/13/2017   NEUTROABS 6.8 (H) 12/12/2017    @LASTCHEMISTRY @  Urine Studies No results for input(s): UHGB, CRYS in the last 72 hours.  Invalid input(s): UACOL, UAPR, USPG, UPH, UTP, UGL, UKET, UBIL, UNIT, UROB, ULEU, UEPI, UWBC, URBC, UBAC, CAST, UCOM, BILUA  Basic Metabolic Panel: Recent Labs  Lab 12/12/17 1103 12/13/17 0352  NA 134* 139  K 3.7 3.7  CL 101 106  CO2 22 22  GLUCOSE 117 153*  BUN 15 19  CREATININE 0.91 0.74  CALCIUM 9.5 9.1   GFR Estimated Creatinine Clearance: 51.8 mL/min (by C-G formula based on SCr of 0.74 mg/dL). Liver Function Tests: Recent Labs  Lab 12/12/17 1103  AST 35*  ALT 33  ALKPHOS 114  BILITOT 0.7  PROT 6.8  ALBUMIN 3.4*   No results for input(s): LIPASE, AMYLASE in the last 168 hours. No results for input(s): AMMONIA in the last 168 hours. Coagulation profile No results for input(s): INR, PROTIME in the last 168 hours.  CBC: Recent Labs  Lab 12/12/17 1103 12/13/17 0352  WBC 7.5 5.1  NEUTROABS 6.8*  --   HGB 10.7* 9.7*  HCT 30.3* 28.5*  MCV 102.7* 102.2*  PLT 191 183   Cardiac Enzymes: No results for input(s): CKTOTAL, CKMB, CKMBINDEX, TROPONINI in the last 168 hours. BNP: Invalid input(s): POCBNP CBG: No results for input(s): GLUCAP in the last 168  hours. D-Dimer No results for input(s): DDIMER in the last 72 hours. Hgb A1c No results for input(s): HGBA1C in the last 72 hours. Lipid Profile No results for input(s): CHOL, HDL, LDLCALC, TRIG, CHOLHDL, LDLDIRECT in the last 72 hours. Thyroid function studies No results for input(s): TSH, T4TOTAL, T3FREE, THYROIDAB in the last 72 hours.  Invalid input(s): FREET3 Anemia work up No results for input(s): VITAMINB12, FOLATE, FERRITIN, TIBC, IRON, RETICCTPCT in the last 72 hours. Microbiology Recent Results (from the past 240 hour(s))  Culture,  sputum-assessment     Status: None   Collection Time: 12/12/17  9:13 PM  Result Value Ref Range Status   Specimen Description SPUTUM  Final   Special Requests NONE  Final   Sputum evaluation   Final    Sputum specimen not acceptable for testing.  Please recollect.   Performed at [facility], [place] [address], [place], [place] [address]    Report Status 12/12/2017 FINAL  Final      Studies:  Dg Chest 2 View  Result Date: 12/12/2017 CLINICAL DATA:  77 year old female with cough and chest pain for 6 days. EXAM: CHEST - 2 VIEW COMPARISON:  06/28/2016 and earlier. FINDINGS: Stable lung volumes. Streaky in confluent medial left lung base opacity on the frontal view is correlated with vague lower lung density on the lateral. Small if any associated pleural effusion. No pneumothorax or pulmonary edema. Lung parenchyma elsewhere appears stable. Stable cardiomegaly. Other mediastinal contours are within normal limits. Visualized tracheal air column is within normal limits. Extensive chronic skeletal deformities in the chest including chronically displaced sternal fracture, chronic rib and left clavicle fractures, and widespread thoracic compression fractures with exaggerated thoracic kyphosis. No acute osseous abnormality identified. Paucity bowel gas in the upper abdomen. IMPRESSION: 1. New abnormal left lower lung opacity is nonspecific but most suggestive of pneumonia. Trace if any associated pleural effusion. If the patient shows clinical signs of infection then followup PA and lateral chest X-ray is recommended in 3-4 weeks following trial of antibiotic therapy to ensure resolution and exclude underlying malignancy. Otherwise chest CT could further characterize (IV contrast preferred but noncontrast may suffice). 2. Otherwise stable chest including numerous chronic thoracic skeletal deformities. Electronically Signed   By: Genevie Ann M.D.   On: 12/12/2017 14:02    Assessment: 77 y.o.  [place] woman with multiple myeloma initially diagnosed Nov 2013, treatment history as detailed above, currently controlled on dexamethasone (last dose 12/12/2017) and daratumumab (last dose 11/21/2017), now admitted with LLL pneumonia.  Plan:  Tongela is severely immunocompromised despite her apparently normal WBC. Her sputum was not evaluable. Cultures are negative so far but it's only been <24h and she had a dose of doxycycline before any cultures were collected. Accordingly I would favor longer observation.  She is allergic to quinolones, macrolides, piperacillin. Currently on doxycycline, ceftriaxone, as well as maintenance acyclovir.   Daya is already agitating to get home. Dr Burr Medico convinced her to stay at least to 06/02. The patient reluctantly agreed to stay until tomorrow (especially if she can take a shower!) and discharge tomorrow may be acceptable as we can arrange further follow up in our office 06/03  I will follow up again early tomorrow.   Appreciate your care of this patient!  Chauncey Cruel, MD 12/13/2017  9:19 AM Medical Oncology and Hematology [facility] [address] [place], [place] [address] Tel. [phone]    Fax. [phone]

## 2017-12-13 NOTE — Progress Notes (Signed)
PROGRESS NOTE  Shannon Rush PJP:[id number] DOB: Dec 06, 1940 DOA: 12/12/2017 PCP: [name], MD  HPI/Recap of past 24 hours:  Shannon Rush is a 77 y.o. female with medical history significant of multiple myeloma status post stem cell transplant, osteoporosis, macrocytic anemia, combined heart failure, febrile neutropenia, STEMI. About 6 days ago, while on a cruise in [place], she had worsening fatigue and malaise, She used an OTC medication called Cold RX that helped with symptoms. She states that she bought a thermometer which was significant for a temp of 101 degrees Fahrenheit. Yesterday, she rechecked her temperature and it was 101 degrees Fahrenheit again. She called the after hours line and was prescribed Levaquin and was told to follow-up with her oncologist. At her appointment, she was found to have hypoxia and low grade fever and was sent to the ED for evaluation.  12/13/2017: Patient seen and examined at bedside.  She states she feels better.  She denies any dyspnea or chest pain.  Assessment/Plan: Active Problems:   Multiple myeloma (HCC)   CAD -50-60% RCA Aug 2015   Left lower lobe pneumonia (HCC)   Chronic combined systolic (congestive) and diastolic (congestive) heart failure (HCC)  Left lower lobe community-acquired pneumonia Continue IV cephalexin and doxycycline Started DuoNeb every 6 hours Started Mucinex twice daily Started Robitussin as needed O2 supplementation to maintain O2 saturation 92% or greater  Acute hypoxic respiratory failure most likely secondary to left lower lobe community-acquired pneumonia Management as stated above  Multiple myeloma Oncology following Keep outpatient appointment  Pancytopenia Oncology following Keep up outpatient appointment  Chronic diastolic CHF No acute issues Last 2D echo done on 06/26/2017 revealed LVEF 55%-60% With grade 1 diastolic dysfunction   Code Status: DNR  Family Communication: None at  bedside  Disposition Plan: Home when clinically stable   Consultants:  Oncology  Procedures:  None  Antimicrobials:  Ceftriaxone  Doxycycline  DVT prophylaxis:  Subcu Lovenox   Objective: Vitals:   12/13/17 0604 12/13/17 1245 12/13/17 1249 12/13/17 1344  BP: 115/68   123/75  Pulse: 78   86  Resp: 15   16  Temp: 98 F (36.7 C)   97.9 F (36.6 C)  TempSrc: Oral   Oral  SpO2: 90% 92% 92% 92%  Weight:      Height:        Intake/Output Summary (Last 24 hours) at 12/13/2017 1600 Last data filed at 12/13/2017 1520 Gross per 24 hour  Intake 820 ml  Output -  Net 820 ml   Filed Weights   12/12/17 1246  Weight: 54 kg (119 lb)    Exam:  . General: 77 y.o. year-old female well developed well nourished in no acute distress.  Alert and oriented x3. . Cardiovascular: Regular rate and rhythm with no rubs or gallops.  No thyromegaly or JVD noted.   Marland Kitchen Respiratory: Mild rales at bases bilaterally. Good inspiratory effort. . Abdomen: Soft nontender nondistended with normal bowel sounds x4 quadrants. . Musculoskeletal: No lower extremity edema. 2/4 pulses in all 4 extremities. . Skin: No ulcerative lesions noted or rashes, . Psychiatry: Mood is appropriate for condition and setting   Data Reviewed: CBC: Recent Labs  Lab 12/12/17 1103 12/13/17 0352  WBC 7.5 5.1  NEUTROABS 6.8*  --   HGB 10.7* 9.7*  HCT 30.3* 28.5*  MCV 102.7* 102.2*  PLT 191 072   Basic Metabolic Panel: Recent Labs  Lab 12/12/17 1103 12/13/17 0352  NA 134* 139  K 3.7  3.7  CL 101 106  CO2 22 22  GLUCOSE 117 153*  BUN 15 19  CREATININE 0.91 0.74  CALCIUM 9.5 9.1   GFR: Estimated Creatinine Clearance: 51.8 mL/min (by C-G formula based on SCr of 0.74 mg/dL). Liver Function Tests: Recent Labs  Lab 12/12/17 1103  AST 35*  ALT 33  ALKPHOS 114  BILITOT 0.7  PROT 6.8  ALBUMIN 3.4*   No results for input(s): LIPASE, AMYLASE in the last 168 hours. No results for input(s): AMMONIA in  the last 168 hours. Coagulation Profile: No results for input(s): INR, PROTIME in the last 168 hours. Cardiac Enzymes: No results for input(s): CKTOTAL, CKMB, CKMBINDEX, TROPONINI in the last 168 hours. BNP (last 3 results) No results for input(s): PROBNP in the last 8760 hours. HbA1C: No results for input(s): HGBA1C in the last 72 hours. CBG: No results for input(s): GLUCAP in the last 168 hours. Lipid Profile: No results for input(s): CHOL, HDL, LDLCALC, TRIG, CHOLHDL, LDLDIRECT in the last 72 hours. Thyroid Function Tests: No results for input(s): TSH, T4TOTAL, FREET4, T3FREE, THYROIDAB in the last 72 hours. Anemia Panel: No results for input(s): VITAMINB12, FOLATE, FERRITIN, TIBC, IRON, RETICCTPCT in the last 72 hours. Urine analysis:    Component Value Date/Time   COLORURINE AMBER (A) 03/16/2016 1645   APPEARANCEUR CLOUDY (A) 03/16/2016 1645   LABSPEC 1.029 03/16/2016 1645   LABSPEC 1.015 08/20/2012 1124   PHURINE 6.0 03/16/2016 1645   GLUCOSEU 100 (A) 03/16/2016 1645   GLUCOSEU Negative 08/20/2012 1124   HGBUR MODERATE (A) 03/16/2016 1645   HGBUR negative 10/22/2007 1336   BILIRUBINUR NEGATIVE 03/16/2016 1645   BILIRUBINUR Negative 08/20/2012 1124   KETONESUR NEGATIVE 03/16/2016 1645   PROTEINUR 100 (A) 03/16/2016 1645   UROBILINOGEN 0.2 09/14/2014 2100   UROBILINOGEN 0.2 08/20/2012 1124   NITRITE NEGATIVE 03/16/2016 1645   LEUKOCYTESUR NEGATIVE 03/16/2016 1645   LEUKOCYTESUR Small 08/20/2012 1124   Sepsis Labs: _0 (procalcitonin:4,lacticidven:4)  ) Recent Results (from the past 240 hour(s))  Culture, blood (Routine X 2) w Reflex to ID Panel     Status: None (Preliminary result)   Collection Time: 12/12/17  1:39 PM  Result Value Ref Range Status   Specimen Description   Final    BLOOD LEFT ANTECUBITAL Performed at [facility] [address] [place], [place] [address]    Special Requests   Final    BOTTLES DRAWN AEROBIC AND  ANAEROBIC Blood Culture adequate volume Performed at [facility] [address], [place], [place] [address]    Culture   Final    NO GROWTH < 24 HOURS Performed at [facility] [address] [place], [place] [address]    Report Status PENDING  Incomplete  Culture, blood (Routine X 2) w Reflex to ID Panel     Status: None (Preliminary result)   Collection Time: 12/12/17  2:16 PM  Result Value Ref Range Status   Specimen Description   Final    BLOOD RIGHT ARM Performed at [facility] [address], [place], [place] [address]    Special Requests   Final    BOTTLES DRAWN AEROBIC AND ANAEROBIC Blood Culture results may not be optimal due to an excessive volume of blood received in culture bottles Performed at [facility] [address] [place], [place] [address]    Culture   Final    NO GROWTH < 24 HOURS Performed at [facility], [address] [place], [place] [address]    Report Status PENDING  Incomplete  Culture, sputum-assessment     Status: None   Collection Time: 12/12/17  9:13 PM  Result Value Ref Range Status   Specimen Description SPUTUM  Final   Special Requests NONE  Final   Sputum evaluation   Final    Sputum specimen not acceptable for testing.  Please recollect.   Performed at [facility], [place] [address], [place], [place] [address]    Report Status 12/12/2017 FINAL  Final      Studies: No results found.  Scheduled Meds: . acyclovir  400 mg Oral BID  . atorvastatin  20 mg Oral Daily  . calcium carbonate  625 mg Oral Daily  . doxycycline  100 mg Oral Q12H  . enoxaparin (LOVENOX) injection  40 mg Subcutaneous Q24H  . guaiFENesin  1,200 mg Oral BID  . ipratropium-albuterol  3 mL Nebulization TID  . lisinopril  2.5 mg Oral Daily  . metoprolol succinate  25 mg Oral Daily    Continuous Infusions: . cefTRIAXone (ROCEPHIN)  IV Stopped (12/13/17 1441)     LOS:  0 days     Kayleen Memos, MD [facility] Pager [phone]  If 7PM-7AM, please contact night-coverage [email or web address] Password [email or web address] 12/13/2017, 4:00 PM

## 2017-12-14 DIAGNOSIS — Z881 Allergy status to other antibiotic agents status: Secondary | ICD-10-CM | POA: Diagnosis not present

## 2017-12-14 DIAGNOSIS — I2510 Atherosclerotic heart disease of native coronary artery without angina pectoris: Secondary | ICD-10-CM | POA: Diagnosis not present

## 2017-12-14 DIAGNOSIS — C9001 Multiple myeloma in remission: Secondary | ICD-10-CM | POA: Diagnosis not present

## 2017-12-14 DIAGNOSIS — I5042 Chronic combined systolic (congestive) and diastolic (congestive) heart failure: Secondary | ICD-10-CM | POA: Diagnosis not present

## 2017-12-14 DIAGNOSIS — J181 Lobar pneumonia, unspecified organism: Secondary | ICD-10-CM | POA: Diagnosis not present

## 2017-12-14 LAB — CBC
HCT: 26.6 % — ABNORMAL LOW (ref 36.0–46.0)
Hemoglobin: 9.3 g/dL — ABNORMAL LOW (ref 12.0–15.0)
MCH: 35.8 pg — ABNORMAL HIGH (ref 26.0–34.0)
MCHC: 35 g/dL (ref 30.0–36.0)
MCV: 102.3 fL — ABNORMAL HIGH (ref 78.0–100.0)
PLATELETS: 173 10*3/uL (ref 150–400)
RBC: 2.6 MIL/uL — AB (ref 3.87–5.11)
RDW: 15.1 % (ref 11.5–15.5)
WBC: 10.6 10*3/uL — AB (ref 4.0–10.5)

## 2017-12-14 LAB — EXPECTORATED SPUTUM ASSESSMENT W REFEX TO RESP CULTURE

## 2017-12-14 LAB — PROCALCITONIN: PROCALCITONIN: 0.21 ng/mL

## 2017-12-14 LAB — EXPECTORATED SPUTUM ASSESSMENT W GRAM STAIN, RFLX TO RESP C

## 2017-12-14 MED ORDER — BENZONATATE 100 MG PO CAPS: 200.0000 mg | ORAL_CAPSULE | Freq: Two times a day (BID) | ORAL | Status: DC | PRN

## 2017-12-14 MED ORDER — GUAIFENESIN-DM 100-10 MG/5ML PO SYRP
5.0000 mL | ORAL_SOLUTION | ORAL | Status: DC | PRN
Start: 2017-12-14 — End: 2017-12-15

## 2017-12-14 MED ORDER — ALBUTEROL SULFATE (2.5 MG/3ML) 0.083% IN NEBU
2.5000 mg | INHALATION_SOLUTION | RESPIRATORY_TRACT | Status: DC | PRN
Start: 2017-12-14 — End: 2017-12-15

## 2017-12-14 MED ORDER — IPRATROPIUM-ALBUTEROL 0.5-2.5 (3) MG/3ML IN SOLN: 3.0000 mL | Freq: Two times a day (BID) | RESPIRATORY_TRACT | Status: DC

## 2017-12-14 MED ORDER — IPRATROPIUM-ALBUTEROL 0.5-2.5 (3) MG/3ML IN SOLN: 3.0000 mL | Freq: Four times a day (QID) | RESPIRATORY_TRACT | Status: DC

## 2017-12-14 MED ORDER — ALBUTEROL SULFATE (2.5 MG/3ML) 0.083% IN NEBU
2.5000 mg | INHALATION_SOLUTION | Freq: Two times a day (BID) | RESPIRATORY_TRACT | Status: DC
  Administered 2017-12-14 – 2017-12-15 (×2): 2.5 mg via RESPIRATORY_TRACT
  Filled 2017-12-14 (×2): qty 3

## 2017-12-14 NOTE — Progress Notes (Signed)
PROGRESS NOTE  Shannon Rush OHF:[id number] DOB: 10/23/1940 DOA: 12/12/2017 PCP: [name], MD  HPI/Recap of past 24 hours:  Shannon Rush is a 77 y.o. female with medical history significant of multiple myeloma status post stem cell transplant, osteoporosis, macrocytic anemia, combined heart failure, febrile neutropenia, STEMI. About 6 days ago, while on a cruise in [place], she had worsening fatigue and malaise, She used an OTC medication called Cold RX that helped with symptoms. She states that she bought a thermometer which was significant for a temp of 101 degrees Fahrenheit. Yesterday, she rechecked her temperature and it was 101 degrees Fahrenheit again. She called the after hours line and was prescribed Levaquin and was told to follow-up with her oncologist. At her appointment, she was found to have hypoxia and low grade fever and was sent to the ED for evaluation.  12/14/17: Bump in white count from 5.1 to 10.6. Will repeat sputum culture since prior sample not acceptable. C/w IV Antibiotics. Procalcitonin reassuring. Will plan to discharge to home tomorrow if no events overnight.    Assessment/Plan: Active Problems:   Multiple myeloma (HCC)   CAD -50-60% RCA Aug 2015   Left lower lobe pneumonia (HCC)   Chronic combined systolic (congestive) and diastolic (congestive) heart failure (HCC)  CAP, left lower lobe in the setting of immunosuppression C/w IV antibiotics C/w duonebs q6h C/w mucinex, robitussin for cough O2 supplementation to maintain O2 saturation 92% or greater  Acute hypoxic resp failure 2/2 CAP, improving Obtain home O2 eval today Secondary to left lower lobe community-acquired pneumonia Management as stated above  Multiple myeloma Oncology following Keep outpatient appointment  Pancytopenia Oncology following Keep up outpatient appointment  Chronic diastolic CHF No acute issues Last 2D echo done on 06/26/2017 revealed LVEF 55%-60% With grade 1  diastolic dysfunction   Code Status: DNR  Family Communication: None at bedside  Disposition Plan: Home when clinically stable   Consultants:  Oncology  Procedures:  None  Antimicrobials:  Ceftriaxone  Doxycycline  DVT prophylaxis:  Subcu Lovenox   Objective: Vitals:   12/13/17 2145 12/14/17 0613 12/14/17 1007 12/14/17 1439  BP: 129/83 110/68  123/79  Pulse: 87 75  88  Resp: 18 17    Temp: 97.9 F (36.6 C) 97.9 F (36.6 C)  98 F (36.7 C)  TempSrc: Oral Oral  Oral  SpO2: 94% 92% 94% 95%  Weight:      Height:        Intake/Output Summary (Last 24 hours) at 12/14/2017 1609 Last data filed at 12/14/2017 0940 Gross per 24 hour  Intake 600 ml  Output -  Net 600 ml   Filed Weights   12/12/17 1246  Weight: 54 kg (119 lb)    Exam:  . General: 77 y.o. year-old female WD WN nAD A&O x 3. . Cardiovascular: RRR no rubs or gallops. NO JVD or thyromegaly.   Marland Kitchen Respiratory: Mild rales at bases bilaterally. Good inspiratory effort. . Abdomen: Soft nontender nondistended with normal bowel sounds x4 quadrants. . Musculoskeletal: No lower extremity edema. 2/4 pulses in all 4 extremities. . Skin: No ulcerative lesions noted or rashes, . Psychiatry: Mood is appropriate for condition and setting   Data Reviewed: CBC: Recent Labs  Lab 12/12/17 1103 12/13/17 0352 12/14/17 0500  WBC 7.5 5.1 10.6*  NEUTROABS 6.8*  --   --   HGB 10.7* 9.7* 9.3*  HCT 30.3* 28.5* 26.6*  MCV 102.7* 102.2* 102.3*  PLT 191 183 173  Basic Metabolic Panel: Recent Labs  Lab 12/12/17 1103 12/13/17 0352  NA 134* 139  K 3.7 3.7  CL 101 106  CO2 22 22  GLUCOSE 117 153*  BUN 15 19  CREATININE 0.91 0.74  CALCIUM 9.5 9.1   GFR: Estimated Creatinine Clearance: 51.8 mL/min (by C-G formula based on SCr of 0.74 mg/dL). Liver Function Tests: Recent Labs  Lab 12/12/17 1103  AST 35*  ALT 33  ALKPHOS 114  BILITOT 0.7  PROT 6.8  ALBUMIN 3.4*   No results for input(s): LIPASE,  AMYLASE in the last 168 hours. No results for input(s): AMMONIA in the last 168 hours. Coagulation Profile: No results for input(s): INR, PROTIME in the last 168 hours. Cardiac Enzymes: No results for input(s): CKTOTAL, CKMB, CKMBINDEX, TROPONINI in the last 168 hours. BNP (last 3 results) No results for input(s): PROBNP in the last 8760 hours. HbA1C: No results for input(s): HGBA1C in the last 72 hours. CBG: No results for input(s): GLUCAP in the last 168 hours. Lipid Profile: No results for input(s): CHOL, HDL, LDLCALC, TRIG, CHOLHDL, LDLDIRECT in the last 72 hours. Thyroid Function Tests: No results for input(s): TSH, T4TOTAL, FREET4, T3FREE, THYROIDAB in the last 72 hours. Anemia Panel: No results for input(s): VITAMINB12, FOLATE, FERRITIN, TIBC, IRON, RETICCTPCT in the last 72 hours. Urine analysis:    Component Value Date/Time   COLORURINE AMBER (A) 03/16/2016 1645   APPEARANCEUR CLOUDY (A) 03/16/2016 1645   LABSPEC 1.029 03/16/2016 1645   LABSPEC 1.015 08/20/2012 1124   PHURINE 6.0 03/16/2016 1645   GLUCOSEU 100 (A) 03/16/2016 1645   GLUCOSEU Negative 08/20/2012 1124   HGBUR MODERATE (A) 03/16/2016 1645   HGBUR negative 10/22/2007 1336   BILIRUBINUR NEGATIVE 03/16/2016 1645   BILIRUBINUR Negative 08/20/2012 1124   KETONESUR NEGATIVE 03/16/2016 1645   PROTEINUR 100 (A) 03/16/2016 1645   UROBILINOGEN 0.2 09/14/2014 2100   UROBILINOGEN 0.2 08/20/2012 1124   NITRITE NEGATIVE 03/16/2016 1645   LEUKOCYTESUR NEGATIVE 03/16/2016 1645   LEUKOCYTESUR Small 08/20/2012 1124   Sepsis Labs: @LABRCNTIP (procalcitonin:4,lacticidven:4)  ) Recent Results (from the past 240 hour(s))  Culture, blood (Routine X 2) w Reflex to ID Panel     Status: None (Preliminary result)   Collection Time: 12/12/17  1:39 PM  Result Value Ref Range Status   Specimen Description   Final    BLOOD LEFT ANTECUBITAL Performed at [facility] [address], [place], [place]  [address]    Special Requests   Final    BOTTLES DRAWN AEROBIC AND ANAEROBIC Blood Culture adequate volume Performed at [facility] [address], [place], [place] [address]    Culture   Final    NO GROWTH 2 DAYS Performed at [facility] [address] [place], [place] [address]    Report Status PENDING  Incomplete  Culture, blood (Routine X 2) w Reflex to ID Panel     Status: None (Preliminary result)   Collection Time: 12/12/17  2:16 PM  Result Value Ref Range Status   Specimen Description   Final    BLOOD RIGHT ARM Performed at [facility] [address] [place], [place] [address]    Special Requests   Final    BOTTLES DRAWN AEROBIC AND ANAEROBIC Blood Culture results may not be optimal due to an excessive volume of blood received in culture bottles Performed at [facility] [address] [place], [place] [address]    Culture  Final    NO GROWTH 2 DAYS Performed at [facility], [place] [address], [place], [place] [address]    Report Status PENDING  Incomplete  Culture, sputum-assessment     Status: None   Collection Time: 12/12/17  9:13 PM  Result Value Ref Range Status   Specimen Description SPUTUM  Final   Special Requests NONE  Final   Sputum evaluation   Final    Sputum specimen not acceptable for testing.  Please recollect.   Performed at [facility], [place] [address], [place], [place] [address]    Report Status 12/12/2017 FINAL  Final  Culture, expectorated sputum-assessment     Status: None   Collection Time: 12/14/17 11:46 AM  Result Value Ref Range Status   Specimen Description SPUTUM  Final   Special Requests Immunocompromised  Final   Sputum evaluation   Final    THIS SPECIMEN IS ACCEPTABLE FOR SPUTUM CULTURE Performed at [facility], [place] [address], [place], [place] [address]    Report Status 12/14/2017 FINAL  Final      Studies: No  results found.  Scheduled Meds: . acyclovir  400 mg Oral BID  . albuterol  2.5 mg Nebulization BID  . atorvastatin  20 mg Oral Daily  . calcium carbonate  625 mg Oral Daily  . doxycycline  100 mg Oral Q12H  . enoxaparin (LOVENOX) injection  40 mg Subcutaneous Q24H  . guaiFENesin  1,200 mg Oral BID  . lisinopril  2.5 mg Oral Daily  . metoprolol succinate  25 mg Oral Daily    Continuous Infusions: . cefTRIAXone (ROCEPHIN)  IV Stopped (12/14/17 1328)     LOS: 0 days     Kayleen Memos, MD [facility] Pager [phone]  If 7PM-7AM, please contact night-coverage [email or web address] Password [facility] 12/14/2017, 4:09 PM

## 2017-12-14 NOTE — Progress Notes (Signed)
Shannon Rush   DOB:1941/03/10   DS#:[id number]   WIO#:[id number]   Interval history: 77 y/o [place] woman with a history of myeloma dating back to Nov 2013, with multiple lytic lesions at presentation, s/p palliative radiation, s/p induction therapy with bortezomib, lenalidomide and dexamethasone followed by autologous stem cell transplant at [facility] Aug 2014, subsequently relapsing and treated with a variety of agents, most recently every-2-week dexamethasone/ daratumumab, with ongoing disease control. She returned from a [place] with cough, SOB, fever and purulent sputum starting a few days before she saw Dr Burr Medico 12/12/2017. She had called our office the night before and started empirically on doxycycline. The patient took dexamethasone 05/31 but her daratumumab was help (most recent dose was 05/10) and she was referred to the ED where CXR showed LLL pneumonia leading to admission   Subjective:  Tells me yesterday was uneventful. She walked the halls about 4 times, w/o event. Had normal BM. Still has some cough. She is working on a long-term project through [employer] and that is keeping her busy. No family in room  Objective: older White woman examined in bed Vitals:   12/13/17 2145 12/14/17 0613  BP: 129/83 110/68  Pulse: 87 75  Resp: 18 17  Temp: 97.9 F (36.6 C) 97.9 F (36.6 C)  SpO2: 94% 92%    Body mass index is 18.92 kg/m.  Intake/Output Summary (Last 24 hours) at 12/14/2017 0813 Last data filed at 12/13/2017 1851 Gross per 24 hour  Intake 1300 ml  Output -  Net 1300 ml     Cheeks look flushed  Lungs no rales or wheezes  Heart RRR  abd soft, NT, +BS  No peripheral edema; significant arthritis over both hands  Neuro: nonfocal, well-oriented, positive affect  CBG (last 3)  No results for input(s): GLUCAP in the last 72 hours.   Labs:  Lab Results  Component Value Date   WBC 10.6 (H) 12/14/2017   HGB 9.3 (L) 12/14/2017   HCT 26.6 (L) 12/14/2017   MCV  102.3 (H) 12/14/2017   PLT 173 12/14/2017   NEUTROABS 6.8 (H) 12/12/2017    @LASTCHEMISTRY @  Urine Studies No results for input(s): UHGB, CRYS in the last 72 hours.  Invalid input(s): UACOL, UAPR, USPG, UPH, UTP, UGL, UKET, UBIL, UNIT, UROB, ULEU, UEPI, UWBC, URBC, UBAC, CAST, UCOM, BILUA  Basic Metabolic Panel: Recent Labs  Lab 12/12/17 1103 12/13/17 0352  NA 134* 139  K 3.7 3.7  CL 101 106  CO2 22 22  GLUCOSE 117 153*  BUN 15 19  CREATININE 0.91 0.74  CALCIUM 9.5 9.1   GFR Estimated Creatinine Clearance: 51.8 mL/min (by C-G formula based on SCr of 0.74 mg/dL). Liver Function Tests: Recent Labs  Lab 12/12/17 1103  AST 35*  ALT 33  ALKPHOS 114  BILITOT 0.7  PROT 6.8  ALBUMIN 3.4*   No results for input(s): LIPASE, AMYLASE in the last 168 hours. No results for input(s): AMMONIA in the last 168 hours. Coagulation profile No results for input(s): INR, PROTIME in the last 168 hours.  CBC: Recent Labs  Lab 12/12/17 1103 12/13/17 0352 12/14/17 0500  WBC 7.5 5.1 10.6*  NEUTROABS 6.8*  --   --   HGB 10.7* 9.7* 9.3*  HCT 30.3* 28.5* 26.6*  MCV 102.7* 102.2* 102.3*  PLT 191 183 173   Cardiac Enzymes: No results for input(s): CKTOTAL, CKMB, CKMBINDEX, TROPONINI in the last 168 hours. BNP: Invalid input(s): POCBNP CBG: No results for input(s): GLUCAP  in the last 168 hours. D-Dimer No results for input(s): DDIMER in the last 72 hours. Hgb A1c No results for input(s): HGBA1C in the last 72 hours. Lipid Profile No results for input(s): CHOL, HDL, LDLCALC, TRIG, CHOLHDL, LDLDIRECT in the last 72 hours. Thyroid function studies No results for input(s): TSH, T4TOTAL, T3FREE, THYROIDAB in the last 72 hours.  Invalid input(s): FREET3 Anemia work up No results for input(s): VITAMINB12, FOLATE, FERRITIN, TIBC, IRON, RETICCTPCT in the last 72 hours. Microbiology Recent Results (from the past 240 hour(s))  Culture, blood (Routine X 2) w Reflex to ID Panel      Status: None (Preliminary result)   Collection Time: 12/12/17  1:39 PM  Result Value Ref Range Status   Specimen Description   Final    BLOOD LEFT ANTECUBITAL Performed at [facility] [address], [place], [place] [address]    Special Requests   Final    BOTTLES DRAWN AEROBIC AND ANAEROBIC Blood Culture adequate volume Performed at [facility] [address] [name], [place] [address]    Culture   Final    NO GROWTH < 24 HOURS Performed at [facility] [address] [place], [place] [address]    Report Status PENDING  Incomplete  Culture, blood (Routine X 2) w Reflex to ID Panel     Status: None (Preliminary result)   Collection Time: 12/12/17  2:16 PM  Result Value Ref Range Status   Specimen Description   Final    BLOOD RIGHT ARM Performed at [facility] [address], [place], [place] [address]    Special Requests   Final    BOTTLES DRAWN AEROBIC AND ANAEROBIC Blood Culture results may not be optimal due to an excessive volume of blood received in culture bottles Performed at [facility] [address] [place], [place] [address]    Culture   Final    NO GROWTH < 24 HOURS Performed at [facility] [address] [place], [place] [address]    Report Status PENDING  Incomplete  Culture, sputum-assessment     Status: None   Collection Time: 12/12/17  9:13 PM  Result Value Ref Range Status   Specimen Description SPUTUM  Final   Special Requests NONE  Final   Sputum evaluation   Final    Sputum specimen not acceptable for testing.  Please recollect.   Performed at [facility], [place] [address] [place], [place] [address]    Report Status 12/12/2017 FINAL  Final      Studies:  Dg Chest 2 View  Result Date: 12/12/2017 CLINICAL DATA:  77 year old female with cough and chest pain for 6 days. EXAM: CHEST - 2 VIEW COMPARISON:  06/28/2016 and earlier.  FINDINGS: Stable lung volumes. Streaky in confluent medial left lung base opacity on the frontal view is correlated with vague lower lung density on the lateral. Small if any associated pleural effusion. No pneumothorax or pulmonary edema. Lung parenchyma elsewhere appears stable. Stable cardiomegaly. Other mediastinal contours are within normal limits. Visualized tracheal air column is within normal limits. Extensive chronic skeletal deformities in the chest including chronically displaced sternal fracture, chronic rib and left clavicle fractures, and widespread thoracic compression fractures with exaggerated thoracic kyphosis. No acute osseous abnormality identified. Paucity bowel gas in the upper abdomen. IMPRESSION: 1. New abnormal left lower lung opacity is nonspecific but most suggestive of pneumonia. Trace if any associated pleural effusion.  If the patient shows clinical signs of infection then followup PA and lateral chest X-ray is recommended in 3-4 weeks following trial of antibiotic therapy to ensure resolution and exclude underlying malignancy. Otherwise chest CT could further characterize (IV contrast preferred but noncontrast may suffice). 2. Otherwise stable chest including numerous chronic thoracic skeletal deformities. Electronically Signed   By: Genevie Ann M.D.   On: 12/12/2017 14:02     Assessment: 77 y.o. [place] woman with multiple myeloma initially diagnosed Nov 2013, treatment history as detailed above, currently controlled on dexamethasone (last dose 12/12/2017) and daratumumab (last dose 11/21/2017), now admitted with LLL pneumonia.  Plan:  Rise in WBC noted and discussed with patient. Will request another sputum as initial not actionable. Of course she has not been on ABX >48h but may document fungi if present  [place] is agreeable with some gentle coaxing to stay in the hospital one more day; I expect Dr Burr Medico will round on her tomorrow AM and barring any turn for the worse [place]  can be discharged then.   She has an important event tomorrow afternoon that cannot be rescheduled.  I have encouraged her to walk the halls and "pretend she's home" (act as if she were home)so that if any problems develop they will occur here, not after discharge  Appreciate your excellent  care of this patient!  Chauncey Cruel, MD 12/14/2017  8:13 AM Medical Oncology and Hematology [facility] [address] [place], [place] [address] Tel. [phone]    Fax. [phone]

## 2017-12-15 DIAGNOSIS — J181 Lobar pneumonia, unspecified organism: Secondary | ICD-10-CM | POA: Diagnosis not present

## 2017-12-15 DIAGNOSIS — I2510 Atherosclerotic heart disease of native coronary artery without angina pectoris: Secondary | ICD-10-CM | POA: Diagnosis not present

## 2017-12-15 DIAGNOSIS — R0902 Hypoxemia: Secondary | ICD-10-CM | POA: Diagnosis not present

## 2017-12-15 DIAGNOSIS — I5042 Chronic combined systolic (congestive) and diastolic (congestive) heart failure: Secondary | ICD-10-CM | POA: Diagnosis not present

## 2017-12-15 LAB — BASIC METABOLIC PANEL
Anion gap: 10 (ref 5–15)
BUN: 22 mg/dL — AB (ref 6–20)
CHLORIDE: 102 mmol/L (ref 101–111)
CO2: 23 mmol/L (ref 22–32)
Calcium: 8.6 mg/dL — ABNORMAL LOW (ref 8.9–10.3)
Creatinine, Ser: 0.74 mg/dL (ref 0.44–1.00)
GFR calc Af Amer: >60 mL/min (ref >60)
GFR calc non Af Amer: >60 mL/min (ref >60)
Glucose, Bld: 92 mg/dL (ref 65–99)
POTASSIUM: 4.1 mmol/L (ref 3.5–5.1)
SODIUM: 135 mmol/L (ref 135–145)

## 2017-12-15 LAB — PROCALCITONIN: Procalcitonin: 0.27 ng/mL

## 2017-12-15 LAB — CBC
HEMATOCRIT: 29 % — AB (ref 36.0–46.0)
HEMOGLOBIN: 9.8 g/dL — AB (ref 12.0–15.0)
MCH: 35.1 pg — ABNORMAL HIGH (ref 26.0–34.0)
MCHC: 33.8 g/dL (ref 30.0–36.0)
MCV: 103.9 fL — AB (ref 78.0–100.0)
Platelets: 168 10*3/uL (ref 150–400)
RBC: 2.79 MIL/uL — ABNORMAL LOW (ref 3.87–5.11)
RDW: 15.1 % (ref 11.5–15.5)
WBC: 5.8 10*3/uL (ref 4.0–10.5)

## 2017-12-15 LAB — LEGIONELLA PNEUMOPHILA SEROGP 1 UR AG: L. pneumophila Serogp 1 Ur Ag: NEGATIVE

## 2017-12-15 MED ORDER — GUAIFENESIN ER 600 MG PO TB12: 1200.0000 mg | ORAL_TABLET | Freq: Two times a day (BID) | ORAL | 0 refills | Status: DC

## 2017-12-15 MED ORDER — CEPHALEXIN 500 MG PO CAPS
500.0000 mg | ORAL_CAPSULE | Freq: Two times a day (BID) | ORAL | Status: DC
  Administered 2017-12-15: 500 mg via ORAL
  Filled 2017-12-15: qty 1

## 2017-12-15 MED ORDER — CEPHALEXIN 500 MG PO CAPS: 500.0000 mg | ORAL_CAPSULE | Freq: Two times a day (BID) | ORAL | 0 refills | Status: AC

## 2017-12-15 MED ORDER — ALBUTEROL SULFATE (2.5 MG/3ML) 0.083% IN NEBU: 2.5000 mg | INHALATION_SOLUTION | Freq: Two times a day (BID) | RESPIRATORY_TRACT | 0 refills | Status: DC

## 2017-12-15 MED ORDER — BENZONATATE 200 MG PO CAPS: 200.0000 mg | ORAL_CAPSULE | Freq: Two times a day (BID) | ORAL | 0 refills | Status: DC | PRN

## 2017-12-15 NOTE — Progress Notes (Signed)
Patient discharged to home, all discharge medications and instructions reviewed and questions answered.  Patient to be assisted to vehicle by wheelchair.  

## 2017-12-15 NOTE — Discharge Instructions (Signed)

## 2017-12-15 NOTE — Progress Notes (Signed)
PT demonstrated hands on understanding of Flutter device. PC at this time. 

## 2017-12-15 NOTE — Discharge Summary (Signed)
Discharge Summary  Shannon Rush RJJ:[id number] DOB: 1940-08-28  PCP: [name], MD  Admit date: 12/12/2017 Discharge date: 12/15/2017  Time spent: 25 minutes  Recommendations for Outpatient Follow-up:  1. Follow-up with oncology 2. Follow up with PCP 3. Take your medications as prescribed  Discharge Diagnoses:  Active Hospital Problems   Diagnosis Date Noted  . Left lower lobe pneumonia ([facility]) 12/12/2017  . Chronic combined systolic (congestive) and diastolic (congestive) heart failure ([place] [facility]) 12/12/2017  . CAD -50-60% RCA Aug 2015 03/11/2014  . Multiple myeloma ([facility]) 07/01/2012    Resolved Hospital Problems  No resolved problems to display.    Discharge Condition: stable  Diet recommendation: Resume previous diet  Vitals:   12/15/17 0516 12/15/17 0817  BP: 120/60   Pulse: 91   Resp: 16   Temp: 99.8 F (37.7 C)   SpO2: 95% 94%    History of present illness:   Shannon Rush a 77 y.o.femalewith medical history significant ofmultiple myeloma status post stem cell transplant, osteoporosis, macrocytic anemia,combined heart failure,febrile neutropenia, STEMI. About 6 days ago, while on a cruise in [place], she had worsening fatigue and malaise, She used an OTC medication called Cold RX that helped with symptoms. She states that she bought a thermometer which was significant for a temp of 101degrees Fahrenheit. Yesterday, she rechecked her temperature and it was 101degrees Fahrenheitagain. She called the after hours line and was prescribed Levaquin andwas toldto follow-up with her oncologist. At her appointment, she was found to have hypoxia and low grade feverand was sent to the ED for evaluation.  12/14/17: Bump in white count from 5.1 to 10.6. Will repeat sputum culture since prior sample not acceptable. C/w IV Antibiotics. Procalcitonin reassuring. Will plan to discharge to home tomorrow if no events overnight.  12/15/2017: Patient seen and examined at  bedside.  No acute events overnight.  She has no new complaints.  Afebrile with no leukocytosis.  The day of discharge patient was hemodynamically stable.  She will need to follow-up with her oncologist, PCP post hospitalization.    Hospital Course:  Active Problems:   Multiple myeloma (HCC)   CAD -50-60% RCA Aug 2015   Left lower lobe pneumonia (HCC)   Chronic combined systolic (congestive) and diastolic (congestive) heart failure (H[facility])  CAP, left lower lobe in the setting of immunosuppression, improving C/w po antibiotics Completed 3 days of IV ceftriaxone Started Keflex on six 319 x 5 days C/w duonebs bid C/w mucinex, robitussin for cough  Acute hypoxic resp failure 2/2 CAP, resolved  Multiple myeloma Oncology following Keep outpatient appointment  Pancytopenia Oncology following Keep up outpatient appointment  Chronic diastolic CHF No acute issues Last 2D echo done on 06/26/2017 revealed LVEF 55%-60% With grade 1 diastolic dysfunction   Procedures:  None  Consultations:  Oncology  Discharge Exam: BP 120/60 (BP Location: Left Arm)   Pulse 91   Temp 99.8 F (37.7 C) (Oral)   Resp 16   Ht 5' 6.5" (1.689 m)   Wt 54 kg (119 lb)   SpO2 94%   BMI 18.92 kg/m  . General: 77 y.o. year-old female well developed well nourished in no acute distress.  Alert and oriented x3. . Cardiovascular: Regular rate and rhythm with no rubs or gallops.  No thyromegaly or JVD noted.   Marland Kitchen Respiratory: Clear to auscultation with no wheezes or rales. Good inspiratory effort. . Abdomen: Soft nontender nondistended with normal bowel sounds x4 quadrants. . Musculoskeletal: No lower extremity edema. 2/4  pulses in all 4 extremities. . Skin: No ulcerative lesions noted or rashes, . Psychiatry: Mood is appropriate for condition and setting  Discharge Instructions You were cared for by a hospitalist during your hospital stay. If you have any questions about your discharge medications  or the care you received while you were in the hospital after you are discharged, you can call the unit and asked to speak with the hospitalist on call if the hospitalist that took care of you is not available. Once you are discharged, your primary care physician will handle any further medical issues. Please note that NO REFILLS for any discharge medications will be authorized once you are discharged, as it is imperative that you return to your primary care physician (or establish a relationship with a primary care physician if you do not have one) for your aftercare needs so that they can reassess your need for medications and monitor your lab values.  Discharge Instructions    DME Nebulizer machine   Complete by:  As directed    Patient needs a nebulizer to treat with the following condition:  CAP (community acquired pneumonia)     Allergies as of 12/15/2017      Reactions   Zosyn [piperacillin Sod-tazobactam So] Rash   Itching, rash and swelling of extremities   Levaquin [levofloxacin In D5w] Rash   Other Rash   Piperacillin-tazobactam In Dex Rash   Zithromax [azithromycin]    Full body rash   Ciprofloxacin Rash   Throwing up and stomach pain       Medication List    STOP taking these medications   acetaminophen 500 MG tablet Commonly known as:  TYLENOL   furosemide 20 MG tablet Commonly known as:  LASIX   nitroGLYCERIN 0.4 MG SL tablet Commonly known as:  NITROSTAT   ondansetron 8 MG tablet Commonly known as:  ZOFRAN   prochlorperazine 10 MG tablet Commonly known as:  COMPAZINE     TAKE these medications   acyclovir 400 MG tablet Commonly known as:  ZOVIRAX Take 1 tablet (400 mg total) by mouth 2 (two) times daily.   atorvastatin 20 MG tablet Commonly known as:  LIPITOR Take 1 tablet (20 mg total) by mouth daily.   benzonatate 200 MG capsule Commonly known as:  TESSALON Take 1 capsule (200 mg total) by mouth 2 (two) times daily as needed for cough (use second).    calcium carbonate 600 MG Tabs tablet Commonly known as:  OS-CAL Take 600 mg by mouth daily.   cephALEXin 500 MG capsule Commonly known as:  KEFLEX Take 1 capsule (500 mg total) by mouth 2 (two) times daily for 5 days.   dexamethasone 4 MG tablet Commonly known as:  DECADRON Take 5 tab the day before chemo, every 2 weeks   doxycycline 100 MG tablet Commonly known as:  VIBRA-TABS Take 100 mg by mouth daily.   fluticasone 50 MCG/ACT nasal spray Commonly known as:  FLONASE Place 2 sprays into both nostrils daily. What changed:    when to take this  reasons to take this   guaiFENesin 600 MG 12 hr tablet Commonly known as:  MUCINEX Take 2 tablets (1,200 mg total) by mouth 2 (two) times daily.   lisinopril 2.5 MG tablet Commonly known as:  PRINIVIL,ZESTRIL Take 1 tablet (2.5 mg total) by mouth daily.   metoprolol succinate 25 MG 24 hr tablet Commonly known as:  TOPROL-XL Take 1 tablet (25 mg total) by mouth daily.   PROAIR  HFA 108 (90 Base) MCG/ACT inhaler Generic drug:  albuterol Inhale 2 puffs into the lungs every 6 (six) hours as needed. What changed:  Another medication with the same name was added. Make sure you understand how and when to take each.   albuterol (2.5 MG/3ML) 0.083% nebulizer solution Commonly known as:  PROVENTIL Take 3 mLs (2.5 mg total) by nebulization 2 (two) times daily. What changed:  You were already taking a medication with the same name, and this prescription was added. Make sure you understand how and when to take each.            Durable Medical Equipment  (From admission, onward)        Start     Ordered   12/15/17 0000  DME Nebulizer machine    Question:  Patient needs a nebulizer to treat with the following condition  Answer:  CAP (community acquired pneumonia)   12/15/17 1220     Allergies  Allergen Reactions  . Zosyn [Piperacillin Sod-Tazobactam So] Rash    Itching, rash and swelling of extremities  . Levaquin  [Levofloxacin In D5w] Rash  . Other Rash  . Piperacillin-Tazobactam In Dex Rash  . Zithromax [Azithromycin]     Full body rash  . Ciprofloxacin Rash    Throwing up and stomach pain    Follow-up Information    [name], Jamal Collin, MD. Call in 1 day(s).   Specialty:  Family Medicine Why:  Please call for appointment. Contact information: [place] [address] [phone]        Magrinat, Virgie Dad, MD. Call in 1 day(s).   Specialty:  Oncology Why:  Please call for appointment. Contact information: [place] [address] [phone]            The results of significant diagnostics from this hospitalization (including imaging, microbiology, ancillary and laboratory) are listed below for reference.    Significant Diagnostic Studies: Dg Chest 2 View  Result Date: 12/12/2017 CLINICAL DATA:  77 year old female with cough and chest pain for 6 days. EXAM: CHEST - 2 VIEW COMPARISON:  06/28/2016 and earlier. FINDINGS: Stable lung volumes. Streaky in confluent medial left lung base opacity on the frontal view is correlated with vague lower lung density on the lateral. Small if any associated pleural effusion. No pneumothorax or pulmonary edema. Lung parenchyma elsewhere appears stable. Stable cardiomegaly. Other mediastinal contours are within normal limits. Visualized tracheal air column is within normal limits. Extensive chronic skeletal deformities in the chest including chronically displaced sternal fracture, chronic rib and left clavicle fractures, and widespread thoracic compression fractures with exaggerated thoracic kyphosis. No acute osseous abnormality identified. Paucity bowel gas in the upper abdomen. IMPRESSION: 1. New abnormal left lower lung opacity is nonspecific but most suggestive of pneumonia. Trace if any associated pleural effusion. If the patient shows clinical signs of infection then followup PA and lateral chest X-ray is  recommended in 3-4 weeks following trial of antibiotic therapy to ensure resolution and exclude underlying malignancy. Otherwise chest CT could further characterize (IV contrast preferred but noncontrast may suffice). 2. Otherwise stable chest including numerous chronic thoracic skeletal deformities. Electronically Signed   By: Genevie Ann M.D.   On: 12/12/2017 14:02    Microbiology: Recent Results (from the past 240 hour(s))  Culture, blood (Routine X 2) w Reflex to ID Panel     Status: None (Preliminary result)   Collection Time: 12/12/17  1:39 PM  Result Value Ref Range Status  Specimen Description   Final    BLOOD LEFT ANTECUBITAL Performed at [facility] [address] [place], [place] [address]    Special Requests   Final    BOTTLES DRAWN AEROBIC AND ANAEROBIC Blood Culture adequate volume Performed at [facility] [address] [place], [place] [address]    Culture   Final    NO GROWTH 2 DAYS Performed at [facility] [address] [place], [place] [address]    Report Status PENDING  Incomplete  Culture, blood (Routine X 2) w Reflex to ID Panel     Status: None (Preliminary result)   Collection Time: 12/12/17  2:16 PM  Result Value Ref Range Status   Specimen Description   Final    BLOOD RIGHT ARM Performed at [facility] [address], [place], [place] [address]    Special Requests   Final    BOTTLES DRAWN AEROBIC AND ANAEROBIC Blood Culture results may not be optimal due to an excessive volume of blood received in culture bottles Performed at [facility] [address], [place], [place] [address]    Culture   Final    NO GROWTH 2 DAYS Performed at [facility] [address], [place], [place] [address]    Report Status PENDING  Incomplete  Culture, sputum-assessment     Status: None   Collection Time: 12/12/17  9:13 PM  Result Value Ref Range Status   Specimen  Description SPUTUM  Final   Special Requests NONE  Final   Sputum evaluation   Final    Sputum specimen not acceptable for testing.  Please recollect.   Performed at [facility], [place] [address], [place], [place] [address]    Report Status 12/12/2017 FINAL  Final  Culture, expectorated sputum-assessment     Status: None   Collection Time: 12/14/17 11:46 AM  Result Value Ref Range Status   Specimen Description SPUTUM  Final   Special Requests Immunocompromised  Final   Sputum evaluation   Final    THIS SPECIMEN IS ACCEPTABLE FOR SPUTUM CULTURE Performed at [facility], [place] [address], [place], [place] [address]    Report Status 12/14/2017 FINAL  Final  Culture, respiratory (NON-Expectorated)     Status: None (Preliminary result)   Collection Time: 12/14/17 11:46 AM  Result Value Ref Range Status   Specimen Description   Final    SPUTUM Performed at [facility] [address] [place], [place] [address]    Special Requests   Final    Immunocompromised Reflexed from [phone] Performed at [facility], [place] [address], [place], [place] [address]    Gram Stain   Final    RARE WBC PRESENT, PREDOMINANTLY MONONUCLEAR RARE SQUAMOUS EPITHELIAL CELLS PRESENT MODERATE GRAM NEGATIVE COCCOBACILLI FEW GRAM POSITIVE COCCI IN PAIRS RARE GRAM POSITIVE RODS    Culture   Final    CULTURE REINCUBATED FOR BETTER GROWTH Performed at [facility], [place] [address] [place], [place] [address]    Report Status PENDING  Incomplete     Labs: Basic Metabolic Panel: Recent Labs  Lab 12/12/17 1103 12/13/17 0352 12/15/17 0523  NA 134* 139 135  K 3.7 3.7 4.1  CL 101 106 102  CO2 22 22 23   GLUCOSE 117 153* 92  BUN 15 19 22*  CREATININE 0.91 0.74 0.74  CALCIUM 9.5 9.1 8.6*   Liver Function  Tests: Recent Labs  Lab 12/12/17 1103  AST 35*  ALT 33  ALKPHOS 114  BILITOT 0.7  PROT 6.8  ALBUMIN 3.4*   No results  for input(s): LIPASE, AMYLASE in the last 168 hours. No results for input(s): AMMONIA in the last 168 hours. CBC: Recent Labs  Lab 12/12/17 1103 12/13/17 0352 12/14/17 0500 12/15/17 0523  WBC 7.5 5.1 10.6* 5.8  NEUTROABS 6.8*  --   --   --   HGB 10.7* 9.7* 9.3* 9.8*  HCT 30.3* 28.5* 26.6* 29.0*  MCV 102.7* 102.2* 102.3* 103.9*  PLT 191 183 173 168   Cardiac Enzymes: No results for input(s): CKTOTAL, CKMB, CKMBINDEX, TROPONINI in the last 168 hours. BNP: BNP (last 3 results) No results for input(s): BNP in the last 8760 hours.  ProBNP (last 3 results) No results for input(s): PROBNP in the last 8760 hours.  CBG: No results for input(s): GLUCAP in the last 168 hours.     Signed:  Kayleen Memos, MD [facility] 12/15/2017, 12:23 PM

## 2017-12-16 LAB — CULTURE, RESPIRATORY W GRAM STAIN: Culture: NORMAL

## 2017-12-16 LAB — CULTURE, RESPIRATORY

## 2017-12-17 ENCOUNTER — Telehealth: Payer: Self-pay | Admitting: Family Medicine

## 2017-12-17 LAB — CULTURE, BLOOD (ROUTINE X 2)
CULTURE: NO GROWTH
Culture: NO GROWTH
Special Requests: ADEQUATE

## 2017-12-17 NOTE — Telephone Encounter (Signed)
error 

## 2017-12-25 ENCOUNTER — Encounter: Payer: Self-pay | Admitting: Family Medicine

## 2017-12-25 ENCOUNTER — Ambulatory Visit (INDEPENDENT_AMBULATORY_CARE_PROVIDER_SITE_OTHER): Payer: Medicare Other | Admitting: Family Medicine

## 2017-12-25 ENCOUNTER — Other Ambulatory Visit: Payer: Self-pay

## 2017-12-25 DIAGNOSIS — R197 Diarrhea, unspecified: Secondary | ICD-10-CM | POA: Diagnosis not present

## 2017-12-25 DIAGNOSIS — E441 Mild protein-calorie malnutrition: Secondary | ICD-10-CM | POA: Diagnosis not present

## 2017-12-25 DIAGNOSIS — J189 Pneumonia, unspecified organism: Secondary | ICD-10-CM | POA: Diagnosis not present

## 2017-12-25 DIAGNOSIS — T07XXXA Unspecified multiple injuries, initial encounter: Secondary | ICD-10-CM

## 2017-12-25 DIAGNOSIS — I5189 Other ill-defined heart diseases: Secondary | ICD-10-CM

## 2017-12-25 NOTE — Patient Instructions (Addendum)
Best advice for wounds now is to keep moist (vasoline or triple antibiotic oint) and covered as you can. Watch for signs of infection. Cough go on for 6 to 8 weeks.   Try to get some extra calories in you. Because you have low blood pressure and your heart function is now normal, Please stop the lisinopril

## 2017-12-26 DIAGNOSIS — T07XXXA Unspecified multiple injuries, initial encounter: Secondary | ICD-10-CM | POA: Insufficient documentation

## 2017-12-26 DIAGNOSIS — R197 Diarrhea, unspecified: Secondary | ICD-10-CM | POA: Insufficient documentation

## 2017-12-26 NOTE — Assessment & Plan Note (Signed)
Resolving nicely.  No O2 requirement.  Should be fine coming off antibiotics.
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
## 2017-12-26 NOTE — Assessment & Plan Note (Signed)
With hypotension and resolved systolic dysfunction, will DC lisinopril.  OK to cont metoprolol.  Euvolemic today.
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
## 2017-12-26 NOTE — Assessment & Plan Note (Signed)
Of course, with antibiotics worry about developing C diff.  Completing antibiotics today.  Recommend probiotics (yogurt.)  Monitor.  Test for C diff if worsens.

## 2017-12-26 NOTE — Assessment & Plan Note (Signed)
Encourage PO.  Patient is aware of weight issues.

## 2017-12-30 ENCOUNTER — Encounter (HOSPITAL_COMMUNITY): Payer: Self-pay | Admitting: Radiology

## 2017-12-30 ENCOUNTER — Emergency Department (HOSPITAL_COMMUNITY): Payer: Medicare Other

## 2017-12-30 ENCOUNTER — Inpatient Hospital Stay (HOSPITAL_COMMUNITY)
Admission: EM | Admit: 2017-12-30 | Discharge: 2018-01-02 | DRG: 193 | Disposition: A | Discharge status: Home / Self Care | Payer: Medicare Other | Attending: Internal Medicine | Admitting: Internal Medicine

## 2017-12-30 ENCOUNTER — Telehealth: Payer: Self-pay | Admitting: Hematology

## 2017-12-30 DIAGNOSIS — Z923 Personal history of irradiation: Secondary | ICD-10-CM | POA: Diagnosis not present

## 2017-12-30 DIAGNOSIS — Z7952 Long term (current) use of systemic steroids: Secondary | ICD-10-CM | POA: Diagnosis not present

## 2017-12-30 DIAGNOSIS — Z79899 Other long term (current) drug therapy: Secondary | ICD-10-CM | POA: Diagnosis not present

## 2017-12-30 DIAGNOSIS — Z9049 Acquired absence of other specified parts of digestive tract: Secondary | ICD-10-CM

## 2017-12-30 DIAGNOSIS — Z83511 Family history of glaucoma: Secondary | ICD-10-CM | POA: Diagnosis not present

## 2017-12-30 DIAGNOSIS — Z9484 Stem cells transplant status: Secondary | ICD-10-CM

## 2017-12-30 DIAGNOSIS — R0902 Hypoxemia: Secondary | ICD-10-CM | POA: Diagnosis not present

## 2017-12-30 DIAGNOSIS — Z8249 Family history of ischemic heart disease and other diseases of the circulatory system: Secondary | ICD-10-CM | POA: Diagnosis not present

## 2017-12-30 DIAGNOSIS — Z8042 Family history of malignant neoplasm of prostate: Secondary | ICD-10-CM

## 2017-12-30 DIAGNOSIS — Z8701 Personal history of pneumonia (recurrent): Secondary | ICD-10-CM

## 2017-12-30 DIAGNOSIS — Z880 Allergy status to penicillin: Secondary | ICD-10-CM | POA: Diagnosis not present

## 2017-12-30 DIAGNOSIS — Z9221 Personal history of antineoplastic chemotherapy: Secondary | ICD-10-CM

## 2017-12-30 DIAGNOSIS — C9002 Multiple myeloma in relapse: Secondary | ICD-10-CM | POA: Diagnosis not present

## 2017-12-30 DIAGNOSIS — I5032 Chronic diastolic (congestive) heart failure: Secondary | ICD-10-CM | POA: Diagnosis not present

## 2017-12-30 DIAGNOSIS — I252 Old myocardial infarction: Secondary | ICD-10-CM | POA: Diagnosis not present

## 2017-12-30 DIAGNOSIS — M810 Age-related osteoporosis without current pathological fracture: Secondary | ICD-10-CM | POA: Diagnosis not present

## 2017-12-30 DIAGNOSIS — R5383 Other fatigue: Secondary | ICD-10-CM | POA: Diagnosis not present

## 2017-12-30 DIAGNOSIS — J470 Bronchiectasis with acute lower respiratory infection: Secondary | ICD-10-CM | POA: Diagnosis present

## 2017-12-30 DIAGNOSIS — I2510 Atherosclerotic heart disease of native coronary artery without angina pectoris: Secondary | ICD-10-CM | POA: Diagnosis not present

## 2017-12-30 DIAGNOSIS — R05 Cough: Secondary | ICD-10-CM | POA: Diagnosis not present

## 2017-12-30 DIAGNOSIS — J9601 Acute respiratory failure with hypoxia: Secondary | ICD-10-CM | POA: Diagnosis not present

## 2017-12-30 DIAGNOSIS — I509 Heart failure, unspecified: Secondary | ICD-10-CM | POA: Diagnosis not present

## 2017-12-30 DIAGNOSIS — J219 Acute bronchiolitis, unspecified: Secondary | ICD-10-CM | POA: Diagnosis not present

## 2017-12-30 DIAGNOSIS — I428 Other cardiomyopathies: Secondary | ICD-10-CM | POA: Diagnosis present

## 2017-12-30 DIAGNOSIS — R509 Fever, unspecified: Secondary | ICD-10-CM | POA: Diagnosis not present

## 2017-12-30 DIAGNOSIS — J189 Pneumonia, unspecified organism: Secondary | ICD-10-CM | POA: Diagnosis not present

## 2017-12-30 DIAGNOSIS — J181 Lobar pneumonia, unspecified organism: Secondary | ICD-10-CM | POA: Diagnosis not present

## 2017-12-30 DIAGNOSIS — Y95 Nosocomial condition: Secondary | ICD-10-CM | POA: Diagnosis present

## 2017-12-30 DIAGNOSIS — M8458XA Pathological fracture in neoplastic disease, other specified site, initial encounter for fracture: Secondary | ICD-10-CM | POA: Diagnosis not present

## 2017-12-30 DIAGNOSIS — Z66 Do not resuscitate: Secondary | ICD-10-CM | POA: Diagnosis not present

## 2017-12-30 DIAGNOSIS — C9000 Multiple myeloma not having achieved remission: Secondary | ICD-10-CM | POA: Diagnosis present

## 2017-12-30 DIAGNOSIS — Z9851 Tubal ligation status: Secondary | ICD-10-CM

## 2017-12-30 DIAGNOSIS — Z881 Allergy status to other antibiotic agents status: Secondary | ICD-10-CM

## 2017-12-30 LAB — CBC WITH DIFFERENTIAL/PLATELET
BASOS ABS: 0 10*3/uL (ref 0.0–0.1)
Basophils Relative: 0 %
Eosinophils Absolute: 0.1 10*3/uL (ref 0.0–0.7)
Eosinophils Relative: 1 %
HEMATOCRIT: 32 % — AB (ref 36.0–46.0)
HEMOGLOBIN: 10.6 g/dL — AB (ref 12.0–15.0)
LYMPHS ABS: 0.5 10*3/uL — AB (ref 0.7–4.0)
Lymphocytes Relative: 8 %
MCH: 33.9 pg (ref 26.0–34.0)
MCHC: 33.1 g/dL (ref 30.0–36.0)
MCV: 102.2 fL — AB (ref 78.0–100.0)
MONOS PCT: 7 %
Monocytes Absolute: 0.4 10*3/uL (ref 0.1–1.0)
Neutro Abs: 5 10*3/uL (ref 1.7–7.7)
Neutrophils Relative %: 84 %
Platelets: 177 10*3/uL (ref 150–400)
RBC: 3.13 MIL/uL — AB (ref 3.87–5.11)
RDW: 15.1 % (ref 11.5–15.5)
WBC: 5.9 10*3/uL (ref 4.0–10.5)

## 2017-12-30 LAB — COMPREHENSIVE METABOLIC PANEL
ALT: 16 U/L (ref 14–54)
AST: 19 U/L (ref 15–41)
Albumin: 3.7 g/dL (ref 3.5–5.0)
Alkaline Phosphatase: 65 U/L (ref 38–126)
Anion gap: 9 (ref 5–15)
BILIRUBIN TOTAL: 0.7 mg/dL (ref 0.3–1.2)
BUN: 17 mg/dL (ref 6–20)
CO2: 26 mmol/L (ref 22–32)
CREATININE: 1.01 mg/dL — AB (ref 0.44–1.00)
Calcium: 8.9 mg/dL (ref 8.9–10.3)
Chloride: 103 mmol/L (ref 101–111)
GFR calc non Af Amer: 53 mL/min — ABNORMAL LOW (ref >60)
GLUCOSE: 126 mg/dL — AB (ref 65–99)
Potassium: 4.3 mmol/L (ref 3.5–5.1)
SODIUM: 138 mmol/L (ref 135–145)
TOTAL PROTEIN: 6.4 g/dL — AB (ref 6.5–8.1)

## 2017-12-30 LAB — URINALYSIS, ROUTINE W REFLEX MICROSCOPIC
Bilirubin Urine: NEGATIVE
Glucose, UA: NEGATIVE mg/dL
Hgb urine dipstick: NEGATIVE
Ketones, ur: 5 mg/dL — AB
LEUKOCYTES UA: NEGATIVE
NITRITE: NEGATIVE
PROTEIN: NEGATIVE mg/dL
SPECIFIC GRAVITY, URINE: 1.018 (ref 1.005–1.030)
pH: 7 (ref 5.0–8.0)

## 2017-12-30 LAB — PROTIME-INR
INR: 0.95
Prothrombin Time: 12.6 seconds (ref 11.4–15.2)

## 2017-12-30 LAB — I-STAT TROPONIN, ED: TROPONIN I, POC: 0.02 ng/mL (ref 0.00–0.08)

## 2017-12-30 LAB — I-STAT CG4 LACTIC ACID, ED: Lactic Acid, Venous: 0.76 mmol/L (ref 0.5–1.9)

## 2017-12-30 LAB — BRAIN NATRIURETIC PEPTIDE: B NATRIURETIC PEPTIDE 5: 176.4 pg/mL — AB (ref 0.0–100.0)

## 2017-12-30 MED ORDER — SODIUM CHLORIDE 0.9 % IV SOLN
1.0000 g | Freq: Once | INTRAVENOUS | Status: AC
  Administered 2017-12-31: 1 g via INTRAVENOUS
  Filled 2017-12-30: qty 1

## 2017-12-30 MED ORDER — IOPAMIDOL (ISOVUE-370) INJECTION 76%
100.0000 mL | Freq: Once | INTRAVENOUS | Status: AC | PRN
  Administered 2017-12-30: 100 mL via INTRAVENOUS

## 2017-12-30 MED ORDER — IOPAMIDOL (ISOVUE-370) INJECTION 76%
INTRAVENOUS | Status: AC
  Filled 2017-12-30: qty 100

## 2017-12-30 NOTE — ED Provider Notes (Signed)
[facility] DEPT Provider Note   CSN: [id number] Arrival date & time: 12/30/17  1919     History   Chief Complaint Chief Complaint  Patient presents with  . Fever  . Cough    HPI Shannon Rush is a 77 y.o. female.  The history is provided by the patient, medical records and the spouse. No language interpreter was used.  Cough  This is a recurrent problem. The current episode started more than 2 days ago. The problem occurs constantly. The problem has been gradually worsening. The cough is productive of sputum. The maximum temperature recorded prior to her arrival was 101 to 101.9 F. The fever has been present for 1 to 2 days. Associated symptoms include chills and shortness of breath. Pertinent negatives include no chest pain, no headaches, no rhinorrhea and no wheezing. She has tried nothing for the symptoms. The treatment provided no relief.    Past Medical History:  Diagnosis Date  . CAD (coronary artery disease)    a. inf STEMI (in [place] >>> [facility]) >>> LHC (8/15- [place]):  pRCA 60%, mid to dist RCA 50% >>> med rx  . Cord compression ([place]) 07/13/12   MRI- diffuse myeloma involvement of T-L spine  . History of radiation therapy 07/13/12-07/27/12   spinal cord compression T3-T10,left scapula  . Hx of echocardiogram    Echo (03/02/14-done at [facility]):  Normal LVEF without significant regional wall motion abnormalities. Mild   . Multiple myeloma ([place]) 07/01/2012  . OSTEOPOROSIS 06/11/2010   Multiple compression fractures; and spontaneous fracture of sternum Qualifier: Diagnosis of  By: [name], [name]     . Thoracic kyphosis 07/13/12   per MRI scan  . Unspecified deficiency anemia     Patient Active Problem List   Diagnosis Date Noted  . Diarrhea 12/26/2017  . Multiple abrasions 12/26/2017  . Port-A-Cath in place 11/21/2017  . Goals of care, counseling/discussion 02/28/2017  .  Non-ischemic cardiomyopathy ([place]) 12/04/2016  . Antineoplastic chemotherapy induced anemia 03/28/2016  . Shortness of breath   . Diastolic dysfunction-grade 2 03/27/2016  . Patient on antineoplastic chemotherapy regimen   . Adrenocortical insufficiency ([place])   . Drug-induced neutropenia ([place]) 03/11/2016  . HCAP (healthcare-associated pneumonia) 03/10/2016  . Corn of foot 11/04/2014  . Protein-calorie malnutrition ([place]) 09/23/2014  . Pancytopenia ([place]) 08/10/2014  . CAD -50-60% RCA Aug 2015 03/11/2014  . History of STEMI-Aug 2015- secondary to thrombotic state from chemo 03/10/2014  . Unspecified vitamin D deficiency 05/05/2013  . History of peripheral stem cell transplant ([place]) 03/05/2013  . History of organ or tissue transplant 03/05/2013  . Swelling, mass, or lump in chest 12/24/2012  . Multiple myeloma ([place]) 07/01/2012  . Sciatica of left side 06/03/2011  . Osteoporosis 06/11/2010  . Anemia, macrocytic 06/02/2009  . DJD, UNSPECIFIED 09/11/2006    Past Surgical History:  Procedure Laterality Date  . APPENDECTOMY    . CESAREAN SECTION     x2   . COLONOSCOPY  2007   neg with Dr. [name]  . ELBOW SURGERY    . HIP SURGERY  2009   left  . TUBAL LIGATION       OB History   None      Home Medications    Prior to Admission medications   Medication Sig Start Date End Date Taking? Authorizing Provider  acyclovir (ZOVIRAX) 400 MG tablet Take 1 tablet (400 mg total) by mouth 2 (two) times daily.  04/24/17   [name], MD  atorvastatin (LIPITOR) 20 MG tablet Take 1 tablet (20 mg total) by mouth daily. 08/06/17 08/01/18  [name], MD  calcium carbonate (OS-CAL) 600 MG TABS tablet Take 600 mg by mouth daily.    [provider]  dexamethasone (DECADRON) 4 MG tablet Take 5 tab the day before chemo, every 2 weeks 09/26/17   [name], MD  fluticasone [facility]) 50 MCG/ACT nasal spray Place 2 sprays into both nostrils daily. Patient taking differently: Place 2 sprays  into both nostrils daily as needed.  02/15/16   Rumley, Burna Cash, DO  metoprolol succinate (TOPROL-XL) 25 MG 24 hr tablet Take 1 tablet (25 mg total) by mouth daily. 12/12/17   [name], MD    Family History Family History  Problem Relation Age of Onset  . Glaucoma Mother   . Heart disease Mother   . Peripheral vascular disease Mother   . Raynaud syndrome Mother   . Heart disease Father   . Heart failure Father   . Glaucoma Father   . Cancer Brother        stage IV prostate CA  . Raynaud syndrome Daughter   . Raynaud syndrome Daughter     Social History Social History   Tobacco Use  . Smoking status: Never Smoker  . Smokeless tobacco: Never Used  Substance Use Topics  . Alcohol use: Yes    Alcohol/week: 4.2 oz    Types: 7 Standard drinks or equivalent per week    Comment: 1 glass of wine or beer 3-5 days per week  . Drug use: No     Allergies   Zosyn [piperacillin sod-tazobactam so]; Levaquin [levofloxacin in d5w]; Other; Piperacillin-tazobactam in dex; Zithromax [azithromycin]; and Ciprofloxacin   Review of Systems Review of Systems  Constitutional: Positive for chills and fatigue. Negative for diaphoresis and fever.  HENT: Negative for congestion and rhinorrhea.   Eyes: Negative for visual disturbance.  Respiratory: Positive for cough and shortness of breath. Negative for chest tightness and wheezing.   Cardiovascular: Negative for chest pain and palpitations.  Gastrointestinal: Positive for nausea. Negative for abdominal pain, constipation, diarrhea and vomiting.  Genitourinary: Negative for dysuria, flank pain and frequency.  Musculoskeletal: Negative for back pain, neck pain and neck stiffness.  Neurological: Negative for light-headedness and headaches.  Psychiatric/Behavioral: Negative for agitation.  All other systems reviewed and are negative.    Physical Exam Updated Vital Signs BP 121/87 (BP Location: Left Arm)   Pulse (!) 114   Temp 99.9  F (37.7 C) (Oral)   Resp 20   SpO2 (!) 81%   Physical Exam  Constitutional: She is oriented to person, place, and time. She appears well-developed and well-nourished. No distress.  HENT:  Head: Normocephalic and atraumatic.  Right Ear: External ear normal.  Left Ear: External ear normal.  Nose: Nose normal.  Mouth/Throat: Oropharynx is clear and moist. No oropharyngeal exudate.  Eyes: Pupils are equal, round, and reactive to light. Conjunctivae and EOM are normal.  Neck: Normal range of motion. Neck supple.  Cardiovascular: Normal rate and intact distal pulses.  No murmur heard. Pulmonary/Chest: No stridor. Tachypnea noted. No respiratory distress. She has no wheezes. She has rhonchi. She exhibits no tenderness.  Abdominal: She exhibits no distension. There is no tenderness. There is no rebound.  Neurological: She is alert and oriented to person, place, and time. She has normal reflexes. No sensory deficit. She exhibits normal muscle tone. Coordination normal.  Skin:  Skin is warm. No rash noted. She is not diaphoretic. No erythema.  Psychiatric: She has a normal mood and affect.  Nursing note and vitals reviewed.    ED Treatments / Results  Labs (all labs ordered are listed, but only abnormal results are displayed) Labs Reviewed  COMPREHENSIVE METABOLIC PANEL - Abnormal; Notable for the following components:      Result Value   Glucose, Bld 126 (*)    Creatinine, Ser 1.01 (*)    Total Protein 6.4 (*)    GFR calc non Af Amer 53 (*)    All other components within normal limits  CBC WITH DIFFERENTIAL/PLATELET - Abnormal; Notable for the following components:   RBC 3.13 (*)    Hemoglobin 10.6 (*)    HCT 32.0 (*)    MCV 102.2 (*)    Lymphs Abs 0.5 (*)    All other components within normal limits  URINALYSIS, ROUTINE W REFLEX MICROSCOPIC - Abnormal; Notable for the following components:   Ketones, ur 5 (*)    All other components within normal limits  BRAIN NATRIURETIC  PEPTIDE - Abnormal; Notable for the following components:   B Natriuretic Peptide 176.4 (*)    All other components within normal limits  CULTURE, BLOOD (ROUTINE X 2)  CULTURE, BLOOD (ROUTINE X 2)  URINE CULTURE  PROTIME-INR  I-STAT CG4 LACTIC ACID, ED  I-STAT TROPONIN, ED  I-STAT CG4 LACTIC ACID, ED  I-STAT TROPONIN, ED    EKG EKG Interpretation  Date/Time:  Tuesday December 30 2017 20:42:34 EDT Ventricular Rate:  90 PR Interval:    QRS Duration: 92 QT Interval:  343 QTC Calculation: 420 R Axis:   111 Text Interpretation:  Sinus rhythm Right axis deviation Abnormal T, consider ischemia, diffuse leads When compared to prior, no significant changes seen.  No STEMI Confirmed by Antony Blackbird [phone]) on 12/30/2017 8:54:16 PM   Radiology Dg Chest 2 View  Result Date: 12/30/2017 CLINICAL DATA:  Cough and weakness for 72 hours. EXAM: CHEST - 2 VIEW COMPARISON:  12/12/2017 FINDINGS: Stable mild cardiac enlargement. There is mild aortic atherosclerosis without aneurysmal dilatation. Chronic left basilar atelectasis is noted. Blunting the posterior costophrenic angles may represent small posterior pleural effusions. Extensive chronic skeletal deformities are redemonstrated including multiple left-sided ribs, mid left clavicle, left scapula, healed displaced sternal fracture and widespread thoracic compression fractures with exaggerated thoracic kyphosis. No new findings are identified. IMPRESSION: 1. Chronic left basilar atelectasis. Probable small posterior pleural effusions month a posterior costophrenic angles. 2. Stable cardiomegaly with aortic atherosclerosis. 3. Extensive chronic skeletal deformities are redemonstrated including multiple left-sided ribs, mid left clavicle, left scapula, healed displaced sternal fracture and widespread thoracic compression fractures with exaggerated thoracic kyphosis. Electronically Signed   By: Ashley Royalty M.D.   On: 12/30/2017 21:13   Ct Angio Chest Pe W  And/or Wo Contrast  Result Date: 12/30/2017 CLINICAL DATA:  77 year old female with fever, cough, hypoxia. Multiple myeloma. EXAM: CT ANGIOGRAPHY CHEST WITH CONTRAST TECHNIQUE: Multidetector CT imaging of the chest was performed using the standard protocol during bolus administration of intravenous contrast. Multiplanar CT image reconstructions and MIPs were obtained to evaluate the vascular anatomy. CONTRAST:  157m ISOVUE-370 IOPAMIDOL (ISOVUE-370) INJECTION 76% COMPARISON:  Chest CTA 03/24/2016 FINDINGS: Cardiovascular: Good contrast bolus timing in the pulmonary arterial tree. No focal filling defect identified in the pulmonary arteries to suggest acute pulmonary embolism. Extensive calcified coronary artery atherosclerosis and/or stent. No pericardial effusion. Cardiac size at the upper limits of normal. Negative visible  aorta aside from atherosclerosis. Mediastinum/Nodes: No lymphadenopathy. Lungs/Pleura: Small volume retained secretions in the bronchus intermedius. The other major airways are patent. There is bronchiectasis, maximal in the right upper lobe. Chronic right apical consolidation/scarring is associated. There is left lower lobe posterior basal segment consolidation and confluent peribronchial opacity. Superimposed contralateral right lower lobe peribronchial patchy, tree-in-bud nodularity. Distal right lower lobe airway opacification. Mild associated consolidation. Mild bilateral middle lobe scarring appears stable. No pleural effusion. Upper Abdomen: Negative visible liver, spleen, pancreas, adrenal glands, kidneys, and bowel in the upper abdomen. Musculoskeletal: Osteopenia with scattered lytic lesions compatible with multiple myeloma. Chronic pathologic appearing fracture of the sternum. Numerous severe pathologic spinal compression fractures, severe T5 through T9 and T12. Only the T10 and L2 vertebral bodies appear to remain normal in the visible thoracic and lumbar spine. Chronic fracture  deformity of the distal left clavicle and occasional ribs. Review of the MIP images confirms the above findings. IMPRESSION: 1.  Negative for acute pulmonary embolus. 2. Bilateral lower lobe Pneumonia/bronchopneumonia. No pleural effusion. 3. Underlying Bronchiectasis. 4. Chronic multiple myeloma with numerous fractures throughout the thorax. 5. Calcified coronary artery atherosclerosis. Electronically Signed   By: Genevie Ann M.D.   On: 12/30/2017 23:30    Procedures Procedures (including critical care time)  CRITICAL CARE Performed by: Gwenyth Allegra Tegeler Total critical care time: 35 minutes Critical care time was exclusive of separately billable procedures and treating other patients. Critical care was necessary to treat or prevent imminent or life-threatening deterioration. HCAP  Critical care was time spent personally by me on the following activities: development of treatment plan with patient and/or surrogate as well as nursing, discussions with consultants, evaluation of patient's response to treatment, examination of patient, obtaining history from patient or surrogate, ordering and performing treatments and interventions, ordering and review of laboratory studies, ordering and review of radiographic studies, pulse oximetry and re-evaluation of patient's condition.   Medications Ordered in ED Medications  iopamidol (ISOVUE-370) 76 % injection (has no administration in time range)  iopamidol (ISOVUE-370) 76 % injection 100 mL (100 mLs Intravenous Contrast Given 12/30/17 2255)     Initial Impression / Assessment and Plan / ED Course  I have reviewed the triage vital signs and the nursing notes.  Pertinent labs & imaging results that were available during my care of the patient were reviewed by me and considered in my medical decision making (see chart for details).     Shannon Rush is a 77 y.o. female with a past medical history significant for CAD, CHF, osteoporosis, and multiple  myeloma with recent admission for pneumonia who presents with fevers, chills, nausea, worsened productive cough, and shortness of breath.  Patient reports that her last 3 days she has had fatigue and some chills.  She reports she developed fevers and had a temp of 101 today.  Patient reports that she has been coughing up phlegm ever since her recent admission but it worsened today.  She does report some shortness of breath and patient's oxygen saturations were 81% on room air when she arrived today.  She reports nausea but no vomiting.  She reports intermittent diarrhea but denies diarrhea today.  She denies constipation.  She reports her legs are nonedematous and she is not concerned about fluid overload at this time.  She denies any recent trauma.  She says that she has no other complaint on arrival.    On exam, patient has very coarse breath sounds with rhonchi bilaterally.  Patient's chest  and back are nontender.  Legs are nonedematous on my exam with normal DP pulses bilaterally.  Patient normal strength and sensation in her lower extremities.  Patient's abdomen was nontender.  Clinically I am concerned about pneumonia given the patient's tachycardia, temp of 99.9 with the reported 101 earlier today, and her new oxygen requirement.  Patient is on 2 L now which she does not take at home.    Patient with laboratory testing and chest x-ray to look for etiology of symptoms.    Anticipate admission given the patient's new oxygen requirement with her respiratory symptoms.   If pneumonia is not seen, will consider further work-up for pulmonary embolism given the patient's active multiple myeloma and her recent trip to [place].  9:33 PM Initial x-ray did not show pneumonia but did show atelectasis and pleural effusion.  Given that the patient was tachycardic and still has a new option requirement with her recent trip and multiple myeloma, patient will have CT PE study to more definitively look for pneumonia  or pulmonary embolism.    11:42 PM CT scan did not show pulmonary embolism but does show multifocal pneumonia.    Patient will be given antibiotics for likely hospital associated pneumonia given her recent admission for pneumonia.  Pharmacy was called for antibiotic choice.  They recommend meropenem in the setting of the patient's recent cephalosporin use and her allergy profile.  Patient will be admitted to hospitalist service for new oxygen requirement with her pneumonia.   Final Clinical Impressions(s) / ED Diagnoses   Final diagnoses:  HCAP (healthcare-associated pneumonia)  Hypoxia    Clinical Impression: 1. HCAP (healthcare-associated pneumonia)   2. Hypoxia     Disposition: Admit  This note was prepared with assistance of Dragon voice recognition software. Occasional wrong-word or sound-a-like substitutions may have occurred due to the inherent limitations of voice recognition software.     Tegeler, Gwenyth Allegra, MD 12/31/17 [phone]

## 2017-12-30 NOTE — Telephone Encounter (Signed)
Called pt re appts that were added per 6/17 sch msg - spoke w/ pt and confirmed

## 2017-12-30 NOTE — Progress Notes (Signed)
A consult was received from an ED physician for Meropenem per pharmacy dosing.  The patient's profile has been reviewed for ht/wt/allergies/indication/available labs.   A one time order has been placed for meropenem 1gm iv x1.  Further antibiotics/pharmacy consults should be ordered by admitting physician if indicated.                       Thank you, Nani Skillern Crowford 12/30/2017  11:47 PM

## 2017-12-30 NOTE — ED Triage Notes (Addendum)
Patient c/o fever, body aches, and continued cough since yesterday. Admitted with pneumonia earlier in the month. Hx multiple myeloma. Last chemo 5/16. 88% on RA in triage. 94% 3L.

## 2017-12-31 ENCOUNTER — Other Ambulatory Visit: Payer: Self-pay

## 2017-12-31 ENCOUNTER — Encounter (HOSPITAL_COMMUNITY): Payer: Self-pay

## 2017-12-31 DIAGNOSIS — Z8701 Personal history of pneumonia (recurrent): Secondary | ICD-10-CM | POA: Diagnosis not present

## 2017-12-31 DIAGNOSIS — I509 Heart failure, unspecified: Secondary | ICD-10-CM | POA: Diagnosis not present

## 2017-12-31 DIAGNOSIS — J470 Bronchiectasis with acute lower respiratory infection: Secondary | ICD-10-CM | POA: Diagnosis present

## 2017-12-31 DIAGNOSIS — Z8249 Family history of ischemic heart disease and other diseases of the circulatory system: Secondary | ICD-10-CM | POA: Diagnosis not present

## 2017-12-31 DIAGNOSIS — C9002 Multiple myeloma in relapse: Secondary | ICD-10-CM | POA: Diagnosis not present

## 2017-12-31 DIAGNOSIS — I2510 Atherosclerotic heart disease of native coronary artery without angina pectoris: Secondary | ICD-10-CM | POA: Diagnosis present

## 2017-12-31 DIAGNOSIS — I5032 Chronic diastolic (congestive) heart failure: Secondary | ICD-10-CM | POA: Diagnosis present

## 2017-12-31 DIAGNOSIS — Z9851 Tubal ligation status: Secondary | ICD-10-CM | POA: Diagnosis not present

## 2017-12-31 DIAGNOSIS — Z9221 Personal history of antineoplastic chemotherapy: Secondary | ICD-10-CM | POA: Diagnosis not present

## 2017-12-31 DIAGNOSIS — J189 Pneumonia, unspecified organism: Secondary | ICD-10-CM | POA: Diagnosis not present

## 2017-12-31 DIAGNOSIS — J219 Acute bronchiolitis, unspecified: Secondary | ICD-10-CM | POA: Diagnosis present

## 2017-12-31 DIAGNOSIS — Z9484 Stem cells transplant status: Secondary | ICD-10-CM | POA: Diagnosis not present

## 2017-12-31 DIAGNOSIS — Z83511 Family history of glaucoma: Secondary | ICD-10-CM | POA: Diagnosis not present

## 2017-12-31 DIAGNOSIS — R0902 Hypoxemia: Secondary | ICD-10-CM | POA: Diagnosis not present

## 2017-12-31 DIAGNOSIS — Z7952 Long term (current) use of systemic steroids: Secondary | ICD-10-CM | POA: Diagnosis not present

## 2017-12-31 DIAGNOSIS — J9601 Acute respiratory failure with hypoxia: Secondary | ICD-10-CM

## 2017-12-31 DIAGNOSIS — Z9049 Acquired absence of other specified parts of digestive tract: Secondary | ICD-10-CM | POA: Diagnosis not present

## 2017-12-31 DIAGNOSIS — Z79899 Other long term (current) drug therapy: Secondary | ICD-10-CM | POA: Diagnosis not present

## 2017-12-31 DIAGNOSIS — Z923 Personal history of irradiation: Secondary | ICD-10-CM | POA: Diagnosis not present

## 2017-12-31 DIAGNOSIS — M810 Age-related osteoporosis without current pathological fracture: Secondary | ICD-10-CM | POA: Diagnosis present

## 2017-12-31 DIAGNOSIS — M8458XA Pathological fracture in neoplastic disease, other specified site, initial encounter for fracture: Secondary | ICD-10-CM | POA: Diagnosis present

## 2017-12-31 DIAGNOSIS — Z8042 Family history of malignant neoplasm of prostate: Secondary | ICD-10-CM | POA: Diagnosis not present

## 2017-12-31 DIAGNOSIS — Z880 Allergy status to penicillin: Secondary | ICD-10-CM | POA: Diagnosis not present

## 2017-12-31 DIAGNOSIS — I252 Old myocardial infarction: Secondary | ICD-10-CM | POA: Diagnosis not present

## 2017-12-31 DIAGNOSIS — I428 Other cardiomyopathies: Secondary | ICD-10-CM | POA: Diagnosis present

## 2017-12-31 DIAGNOSIS — J181 Lobar pneumonia, unspecified organism: Secondary | ICD-10-CM | POA: Diagnosis present

## 2017-12-31 DIAGNOSIS — Z66 Do not resuscitate: Secondary | ICD-10-CM | POA: Diagnosis present

## 2017-12-31 DIAGNOSIS — Y95 Nosocomial condition: Secondary | ICD-10-CM | POA: Diagnosis present

## 2017-12-31 MED ORDER — IPRATROPIUM-ALBUTEROL 0.5-2.5 (3) MG/3ML IN SOLN: 3.0000 mL | Freq: Four times a day (QID) | RESPIRATORY_TRACT | Status: DC

## 2017-12-31 MED ORDER — ONDANSETRON HCL 4 MG/2ML IJ SOLN: 4.0000 mg | Freq: Four times a day (QID) | INTRAMUSCULAR | Status: DC | PRN

## 2017-12-31 MED ORDER — SODIUM CHLORIDE 0.9 % IV SOLN
INTRAVENOUS | Status: AC
  Administered 2017-12-31: 02:00:00 via INTRAVENOUS

## 2017-12-31 MED ORDER — SODIUM CHLORIDE 0.9 % IV SOLN
100.0000 mg | Freq: Two times a day (BID) | INTRAVENOUS | Status: DC
  Administered 2017-12-31 – 2018-01-01 (×5): 100 mg via INTRAVENOUS
  Filled 2017-12-31 (×7): qty 100

## 2017-12-31 MED ORDER — DEXTROMETHORPHAN POLISTIREX ER 30 MG/5ML PO SUER
15.0000 mg | Freq: Every day | ORAL | Status: DC
  Administered 2017-12-31 – 2018-01-01 (×2): 15 mg via ORAL
  Filled 2017-12-31 (×2): qty 5

## 2017-12-31 MED ORDER — ACETAMINOPHEN 325 MG PO TABS
650.0000 mg | ORAL_TABLET | Freq: Four times a day (QID) | ORAL | Status: DC | PRN
  Administered 2017-12-31 (×2): 650 mg via ORAL
  Administered 2018-01-01: 325 mg via ORAL
  Filled 2017-12-31 (×3): qty 2

## 2017-12-31 MED ORDER — FLUTICASONE PROPIONATE 50 MCG/ACT NA SUSP
2.0000 | Freq: Every day | NASAL | Status: DC | PRN
  Filled 2017-12-31: qty 16

## 2017-12-31 MED ORDER — ONDANSETRON HCL 4 MG PO TABS: 4.0000 mg | ORAL_TABLET | Freq: Four times a day (QID) | ORAL | Status: DC | PRN

## 2017-12-31 MED ORDER — CEFEPIME HCL 1 G IJ SOLR
1.0000 g | Freq: Two times a day (BID) | INTRAMUSCULAR | Status: DC
  Administered 2017-12-31 – 2018-01-02 (×4): 1 g via INTRAVENOUS
  Filled 2017-12-31 (×4): qty 1

## 2017-12-31 MED ORDER — VANCOMYCIN HCL IN DEXTROSE 1-5 GM/200ML-% IV SOLN
1000.0000 mg | Freq: Once | INTRAVENOUS | Status: AC
  Administered 2017-12-31: 1000 mg via INTRAVENOUS
  Filled 2017-12-31: qty 200

## 2017-12-31 MED ORDER — ACETAMINOPHEN 650 MG RE SUPP: 650.0000 mg | Freq: Four times a day (QID) | RECTAL | Status: DC | PRN

## 2017-12-31 MED ORDER — GUAIFENESIN-CODEINE 100-10 MG/5ML PO SOLN
5.0000 mL | ORAL | Status: DC | PRN
Start: 2017-12-31 — End: 2017-12-31
  Administered 2017-12-31: 5 mL via ORAL
  Filled 2017-12-31: qty 5

## 2017-12-31 MED ORDER — SODIUM CHLORIDE 0.9 % IV SOLN
1.0000 g | Freq: Two times a day (BID) | INTRAVENOUS | Status: DC
  Administered 2017-12-31: 1 g via INTRAVENOUS
  Filled 2017-12-31: qty 1

## 2017-12-31 MED ORDER — VANCOMYCIN HCL IN DEXTROSE 750-5 MG/150ML-% IV SOLN: 750.0000 mg | INTRAVENOUS | Status: DC

## 2017-12-31 MED ORDER — METOPROLOL SUCCINATE ER 25 MG PO TB24: 25.0000 mg | ORAL_TABLET | Freq: Every day | ORAL | Status: DC

## 2017-12-31 MED ORDER — IPRATROPIUM-ALBUTEROL 0.5-2.5 (3) MG/3ML IN SOLN: 3.0000 mL | Freq: Four times a day (QID) | RESPIRATORY_TRACT | Status: DC | PRN

## 2017-12-31 MED ORDER — ADULT MULTIVITAMIN W/MINERALS CH
1.0000 | ORAL_TABLET | Freq: Every day | ORAL | Status: DC
  Administered 2017-12-31 – 2018-01-02 (×3): 1 via ORAL
  Filled 2017-12-31 (×3): qty 1

## 2017-12-31 MED ORDER — ORAL CARE MOUTH RINSE
15.0000 mL | Freq: Two times a day (BID) | OROMUCOSAL | Status: DC
  Administered 2017-12-31: 15 mL via OROMUCOSAL

## 2017-12-31 MED ORDER — GUAIFENESIN-CODEINE 100-10 MG/5ML PO SOLN
5.0000 mL | Freq: Three times a day (TID) | ORAL | Status: DC
  Administered 2017-12-31 – 2018-01-02 (×6): 5 mL via ORAL
  Filled 2017-12-31 (×6): qty 5

## 2017-12-31 MED ORDER — PREDNISONE 5 MG PO TABS
30.0000 mg | ORAL_TABLET | Freq: Every day | ORAL | Status: DC
  Administered 2018-01-01 – 2018-01-02 (×2): 30 mg via ORAL
  Filled 2017-12-31 (×2): qty 1

## 2017-12-31 MED ORDER — ENOXAPARIN SODIUM 40 MG/0.4ML ~~LOC~~ SOLN
40.0000 mg | Freq: Every day | SUBCUTANEOUS | Status: DC
  Administered 2017-12-31 – 2018-01-02 (×3): 40 mg via SUBCUTANEOUS
  Filled 2017-12-31 (×3): qty 0.4

## 2017-12-31 MED ORDER — ATORVASTATIN CALCIUM 20 MG PO TABS
20.0000 mg | ORAL_TABLET | Freq: Every day | ORAL | Status: DC
  Administered 2017-12-31 – 2018-01-02 (×3): 20 mg via ORAL
  Filled 2017-12-31 (×3): qty 1

## 2017-12-31 MED ORDER — ACYCLOVIR 400 MG PO TABS
400.0000 mg | ORAL_TABLET | Freq: Two times a day (BID) | ORAL | Status: DC
  Administered 2017-12-31 – 2018-01-02 (×6): 400 mg via ORAL
  Filled 2017-12-31 (×6): qty 1

## 2017-12-31 MED ORDER — PREDNISONE 20 MG PO TABS
40.0000 mg | ORAL_TABLET | Freq: Every day | ORAL | Status: AC
  Administered 2017-12-31: 40 mg via ORAL
  Filled 2017-12-31: qty 2

## 2017-12-31 NOTE — Progress Notes (Signed)
Pharmacy Antibiotic Note  MAICIE VANDERLOOP is a 77 y.o. female admitted on 12/30/2017 with pneumonia.  Pharmacy has been consulted for Vancomycin, meropenem dosing.  Plan: Meropenem 1gm iv q12hr  Vancomycin 1gm iv x1, then 750mg  iv q36hr  Goal AUC = 400 - 500 for all indications, except meningitis (goal AUC > 500 and Cmin 15-20 mcg/mL)  Height: 5' 6.5" (168.9 cm) Weight: 113 lb 5.1 oz (51.4 kg) IBW/kg (Calculated) : 60.45  Temp (24hrs), Avg:99.7 F (37.6 C), Min:99.5 F (37.5 C), Max:99.9 F (37.7 C)  Recent Labs  Lab 12/30/17 2006 12/30/17 2032  WBC 5.9  --   CREATININE 1.01*  --   LATICACIDVEN  --  0.76    Estimated Creatinine Clearance: 39.1 mL/min (A) (by C-G formula based on SCr of 1.01 mg/dL (H)).    Allergies  Allergen Reactions  . Zosyn [Piperacillin Sod-Tazobactam So] Rash    Itching, rash and swelling of extremities  . Levaquin [Levofloxacin In D5w] Rash  . Other Rash  . Piperacillin-Tazobactam In Dex Rash  . Zithromax [Azithromycin]     Full body rash  . Ciprofloxacin Rash    Throwing up and stomach pain     Antimicrobials this admission: Vancomycin 12/31/2017 >> Meropenem 12/30/2017 >>   Dose adjustments this admission: -  Microbiology results: -  Thank you for allowing pharmacy to be a part of this patient's care.  Nani Skillern Crowford 12/31/2017 3:35 AM

## 2017-12-31 NOTE — H&P (Addendum)
History and Physical    Shannon Rush MRN:[id number] DOB: 04/21/1941 DOA: 12/30/2017  PCP: [name], William A, MD   Patient coming from: Home  Chief Complaint: SOB, Cough, Fever  HPI: Shannon Rush is a 77 y.o. female with medical history significant for MM s/p stem cell transplant, nonischemic cardiomyopathy, CAD, who presented to the ED with complaints of shortness of breath cough and fever.  Shortness of breath started yesterday.  She recorded a fever up to 101.4 today.  Patient reports cough productive of whitish sputum present since discharge from hospital.  Recent hospital admission- 5/31-12/15/17-for community-acquired pneumonia.  She had just come back from a cruise to [place] within the month. Patient was treated with IV ceftriaxone and doxycycline 3 days inpatient, patient sent home to complete 5 more days of antibiotics.  Patient reports she took the Keflex as prescribed, and states this was the only antibiotic she was prescribed.  ED Course: Temp 99.9 recorded in ED, heart rate initially 114, improved to 93, O2 sats 81% on room air, improved to >96% on 3 L nasal cannula.  WBC 5.9.  Hemoglobin stable 10.6.  Lactic acid -0.7, i-STAT troponin negative , BNP 176. UA pending.  Chest x-ray was negative for acute abnormality subsequent CTA chest-negative for PE, bilateral lower lobe pneumonia/bronchopneumonia, underlying bronchiectasis.  Chronic multiple myeloma with numerous fractures throughout the thorax.  Patient was started on IV meropenem in ED (in consult with pharmacy , considering multiple allergies).  Hospitalist was called to admit for HCAP.  Review of Systems: As per HPI otherwise 10 point review of systems negative.   Past Medical History:  Diagnosis Date  . CAD (coronary artery disease)    a. inf STEMI (in [place] >>> [facility]) >>> LHC (8/15- [facility]):  pRCA 60%, mid to dist RCA 50% >>> med rx  . Cord compression (HCC) 07/13/12   MRI- diffuse myeloma  involvement of T-L spine  . History of radiation therapy 07/13/12-07/27/12   spinal cord compression T3-T10,left scapula  . Hx of echocardiogram    Echo (03/02/14-done at [facility]):  Normal LVEF without significant regional wall motion abnormalities. Mild   . Multiple myeloma (HCC) 07/01/2012  . OSTEOPOROSIS 06/11/2010   Multiple compression fractures; and spontaneous fracture of sternum Qualifier: Diagnosis of  By: Saxon NP, Susan     . Thoracic kyphosis 07/13/12   per MRI scan  . Unspecified deficiency anemia     Past Surgical History:  Procedure Laterality Date  . APPENDECTOMY    . CESAREAN SECTION     x2   . COLONOSCOPY  2007   neg with Dr. Magod  . ELBOW SURGERY    . HIP SURGERY  2009   left  . TUBAL LIGATION       reports that she has never smoked. She has never used smokeless tobacco. She reports that she drinks about 4.2 oz of alcohol per week. She reports that she does not use drugs.  Allergies  Allergen Reactions  . Zosyn [Piperacillin Sod-Tazobactam So] Rash    Itching, rash and swelling of extremities  . Levaquin [Levofloxacin In D5w] Rash  . Other Rash  . Piperacillin-Tazobactam In Dex Rash  . Zithromax [Azithromycin]     Full body rash  . Ciprofloxacin Rash    Throwing up and stomach pain     Family History  Problem Relation Age of Onset  . Glaucoma Mother   . Heart disease Mother   .   Peripheral vascular disease Mother   . Raynaud syndrome Mother   . Heart disease Father   . Heart failure Father   . Glaucoma Father   . Cancer Brother        stage IV prostate CA  . Raynaud syndrome Daughter   . Raynaud syndrome Daughter     Prior to Admission medications   Medication Sig Start Date End Date Taking? Authorizing Provider  acetaminophen (TYLENOL) 500 MG tablet Take 500 mg by mouth every 6 (six) hours as needed for moderate pain or fever.   Yes [provider]  acyclovir (ZOVIRAX) 400 MG tablet Take 1 tablet (400  mg total) by mouth 2 (two) times daily. 04/24/17  Yes [name], MD  atorvastatin (LIPITOR) 20 MG tablet Take 1 tablet (20 mg total) by mouth daily. 08/06/17 08/01/18 Yes [name], MD  calcium carbonate (OS-CAL) 600 MG TABS tablet Take 600 mg by mouth daily.   Yes [provider]  metoprolol succinate (TOPROL-XL) 25 MG 24 hr tablet Take 1 tablet (25 mg total) by mouth daily. 12/12/17  Yes [name], MD  Pseudoeph-Doxylamine-DM-APAP (NYQUIL PO) Take 30 mLs by mouth at bedtime as needed (cough).   Yes [provider]  dexamethasone (DECADRON) 4 MG tablet Take 5 tab the day before chemo, every 2 weeks 09/26/17   [name], MD  fluticasone [facility]) 50 MCG/ACT nasal spray Place 2 sprays into both nostrils daily. Patient taking differently: Place 2 sprays into both nostrils daily as needed.  02/15/16   Lorna Few, DO    Physical Exam: Vitals:   12/30/17 1926 12/30/17 2219  BP: 121/87 122/71  Pulse: (!) 114 98  Resp: 20 (!) 22  Temp: 99.9 F (37.7 C)   TempSrc: Oral   SpO2: (!) 81% 96%    Constitutional: NAD, calm, comfortable Vitals:   12/30/17 1926 12/30/17 2219  BP: 121/87 122/71  Pulse: (!) 114 98  Resp: 20 (!) 22  Temp: 99.9 F (37.7 C)   TempSrc: Oral   SpO2: (!) 81% 96%   Eyes: PERRL, lids and conjunctivae normal ENMT: Mucous membranes are moist. Posterior pharynx clear of any exudate or lesions.Normal dentition.  Neck: normal, supple, no masses, no thyromegaly Respiratory: clear to auscultation bilaterally, no wheezing, no crackles apprecaited. Normal respiratory effort. No accessory muscle use.  Cardiovascular: Regular rate and rhythm, no murmurs / rubs / gallops. No extremity edema. 2+ pedal pulses. No carotid bruits.  Abdomen: mild abdominal tenderness lower abdomen, no masses palpated. No hepatosplenomegaly. Bowel sounds positive.  Musculoskeletal: no clubbing / cyanosis. No joint deformity upper and lower extremities. Good ROM, no  contractures. Normal muscle tone.  Skin: no rashes, lesions, ulcers. No induration Neurologic: CN 2-12 grossly intact. Strength 5/5 in all 4.  Psychiatric: Normal judgment and insight. Alert and oriented x 3. Normal mood.   Labs on Admission: I have personally reviewed following labs and imaging studies  CBC: Recent Labs  Lab 12/30/17 2006  WBC 5.9  NEUTROABS 5.0  HGB 10.6*  HCT 32.0*  MCV 102.2*  PLT 161   Basic Metabolic Panel: Recent Labs  Lab 12/30/17 2006  NA 138  K 4.3  CL 103  CO2 26  GLUCOSE 126*  BUN 17  CREATININE 1.01*  CALCIUM 8.9   Liver Function Tests: Recent Labs  Lab 12/30/17 2006  AST 19  ALT 16  ALKPHOS 65  BILITOT 0.7  PROT 6.4*  ALBUMIN 3.7   Coagulation Profile: Recent Labs  Lab 12/30/17 2006  INR 0.95   Urine analysis:    Component Value Date/Time   COLORURINE YELLOW 12/30/2017 2214   APPEARANCEUR CLEAR 12/30/2017 2214   LABSPEC 1.018 12/30/2017 2214   LABSPEC 1.015 08/20/2012 1124   PHURINE 7.0 12/30/2017 2214   GLUCOSEU NEGATIVE 12/30/2017 2214   GLUCOSEU Negative 08/20/2012 1124   HGBUR NEGATIVE 12/30/2017 2214   HGBUR negative 10/22/2007 1336   BILIRUBINUR NEGATIVE 12/30/2017 2214   BILIRUBINUR Negative 08/20/2012 1124   KETONESUR 5 (A) 12/30/2017 2214   PROTEINUR NEGATIVE 12/30/2017 2214   UROBILINOGEN 0.2 09/14/2014 2100   UROBILINOGEN 0.2 08/20/2012 1124   NITRITE NEGATIVE 12/30/2017 2214   LEUKOCYTESUR NEGATIVE 12/30/2017 2214   LEUKOCYTESUR Small 08/20/2012 1124    Radiological Exams on Admission: Dg Chest 2 View  Result Date: 12/30/2017 CLINICAL DATA:  Cough and weakness for 72 hours. EXAM: CHEST - 2 VIEW COMPARISON:  12/12/2017 FINDINGS: Stable mild cardiac enlargement. There is mild aortic atherosclerosis without aneurysmal dilatation. Chronic left basilar atelectasis is noted. Blunting the posterior costophrenic angles may represent small posterior pleural effusions. Extensive chronic skeletal deformities are  redemonstrated including multiple left-sided ribs, mid left clavicle, left scapula, healed displaced sternal fracture and widespread thoracic compression fractures with exaggerated thoracic kyphosis. No new findings are identified. IMPRESSION: 1. Chronic left basilar atelectasis. Probable small posterior pleural effusions month a posterior costophrenic angles. 2. Stable cardiomegaly with aortic atherosclerosis. 3. Extensive chronic skeletal deformities are redemonstrated including multiple left-sided ribs, mid left clavicle, left scapula, healed displaced sternal fracture and widespread thoracic compression fractures with exaggerated thoracic kyphosis. Electronically Signed   By: Ashley Royalty M.D.   On: 12/30/2017 21:13   Ct Angio Chest Pe W And/or Wo Contrast  Result Date: 12/30/2017 CLINICAL DATA:  77 year old female with fever, cough, hypoxia. Multiple myeloma. EXAM: CT ANGIOGRAPHY CHEST WITH CONTRAST TECHNIQUE: Multidetector CT imaging of the chest was performed using the standard protocol during bolus administration of intravenous contrast. Multiplanar CT image reconstructions and MIPs were obtained to evaluate the vascular anatomy. CONTRAST:  17m ISOVUE-370 IOPAMIDOL (ISOVUE-370) INJECTION 76% COMPARISON:  Chest CTA 03/24/2016 FINDINGS: Cardiovascular: Good contrast bolus timing in the pulmonary arterial tree. No focal filling defect identified in the pulmonary arteries to suggest acute pulmonary embolism. Extensive calcified coronary artery atherosclerosis and/or stent. No pericardial effusion. Cardiac size at the upper limits of normal. Negative visible aorta aside from atherosclerosis. Mediastinum/Nodes: No lymphadenopathy. Lungs/Pleura: Small volume retained secretions in the bronchus intermedius. The other major airways are patent. There is bronchiectasis, maximal in the right upper lobe. Chronic right apical consolidation/scarring is associated. There is left lower lobe posterior basal segment  consolidation and confluent peribronchial opacity. Superimposed contralateral right lower lobe peribronchial patchy, tree-in-bud nodularity. Distal right lower lobe airway opacification. Mild associated consolidation. Mild bilateral middle lobe scarring appears stable. No pleural effusion. Upper Abdomen: Negative visible liver, spleen, pancreas, adrenal glands, kidneys, and bowel in the upper abdomen. Musculoskeletal: Osteopenia with scattered lytic lesions compatible with multiple myeloma. Chronic pathologic appearing fracture of the sternum. Numerous severe pathologic spinal compression fractures, severe T5 through T9 and T12. Only the T10 and L2 vertebral bodies appear to remain normal in the visible thoracic and lumbar spine. Chronic fracture deformity of the distal left clavicle and occasional ribs. Review of the MIP images confirms the above findings. IMPRESSION: 1.  Negative for acute pulmonary embolus. 2. Bilateral lower lobe Pneumonia/bronchopneumonia. No pleural effusion. 3. Underlying Bronchiectasis. 4. Chronic multiple myeloma with numerous fractures throughout the thorax. 5. Calcified  coronary artery atherosclerosis. Electronically Signed   By: Genevie Ann M.D.   On: 12/30/2017 23:30    EKG: Independently reviewed.  QTc 420.  Old T wave inversions - II, III,  aVF unchanged from prior  Assessment/Plan Active Problems:   Multiple myeloma (HCC)   History of STEMI-Aug 2015- secondary to thrombotic state from chemo   HCAP (healthcare-associated pneumonia)  HCAP- SOB, productive cough, febrile, immunocompromised.  Hypoxic.  Tachypneic, initial tachycardia. WBC- 5.9.  Lactic acid 0.76. CT chest-bilateral lower lobe pneumonia, bronchiectasis.  Recent hospital admission earlier this month for CAP, completed 8 days of cephalosporins but only 3 days of atypical coverage with doxycycline. -Continue IV meropenem started in the ED, will add IV vancomycin per pharmacy (quinolone and penicillin allergy  noted) -Will Start doxycycline for atypical coverage,  -Follow blood cultures -Urine Legionella -Hydrate normal saline 100cc/hr X 12 hr -Mucolytics, supplimental O2.  Multiple myeloma- s/p stem cell transplant at [facility], but relapsed.  Patient also underwent palliative radiation and chemotherapy.  Follows with Dr. Burr Medico.  Chest CT shows multiple rib fractures.  Combined systolic and diastolic cardiomyopathy--- Echo 06/2017 improved EF 55 to 60%, G1DD.  Appears stable.  - Cont low dose metop  DVT prophylaxis: Lovenox Code Status: DNR- confirmed with patient at bedside Family Communication: None at bedside Disposition Plan: per rounding team Consults called: None  Admission status: Inpt, tele   Bethena Roys MD [facility] Pager [phone] From 6PM-2AM.  Otherwise please contact night-coverage [email or web address] Password [facility]  12/31/2017, 12:29 AM

## 2017-12-31 NOTE — Progress Notes (Signed)
[place] OF CARE NOTE Patient: Shannon Rush MZU:[id number]   PCP: [name], MD DOB: 1940-12-21   DOA: 12/30/2017   DOS: 12/31/2017    Patient was admitted by my colleague Dr. Denton Brick earlier on 12/31/2017. I have reviewed the H&P as well as assessment and plan and agree with the same. Important changes in the plan are listed below.  Plan of care: Active Problems:   Multiple myeloma (HCC)   History of STEMI-Aug 2015- secondary to thrombotic state from chemo   HCAP (healthcare-associated pneumonia) d/c vancomycin Continue doxycycline  Change meropenem to cefepime since pt tolerated cephalosporin well Control cough aggressively, pt has multiple rib fracture and spinal compression fracture Add prednisone for short course  Author: Berle Mull, MD [facility] Pager: [phone] 12/31/2017 12:35 PM   If 7PM-7AM, please contact night-coverage at [email or web address], password [facility]

## 2017-12-31 NOTE — Progress Notes (Signed)
Shannon Rush   DOB:02-23-1941   NV#:[id number]   HTX#:[id number]  Hematology and Oncology follow up note  Subjective: Patient is well-known to me, under my care for her multiple myeloma.  She was admitted from my office about 2 weeks ago for pneumonia, and was discharged with oral antibiotics.  Her cough and dyspnea did not improve with antibiotics, but did not completely resolve.  She developed worsening cough, sputum production, and a fever about 3 days ago, came into the emergency room yesterday and was admitted for pneumonia.  She still not feeling very well today, on nasal cannula oxygen.  Still coughs a lot.   Objective:  Vitals:   12/31/17 1048 12/31/17 1255  BP:  118/74  Pulse:  (!) 103  Resp:  18  Temp:  100 F (37.8 C)  SpO2: 95% 93%    Body mass index is 18.02 kg/m.  Intake/Output Summary (Last 24 hours) at 12/31/2017 1348 Last data filed at 12/31/2017 1048 Gross per 24 hour  Intake 1036.67 ml  Output 500 ml  Net 536.67 ml     Sclerae unicteric  Oropharynx clear  No peripheral adenopathy  Lungs clear -- no rales or rhonchi, decreased breath sound on bilateral lung bases.  Heart regular rate and rhythm  Abdomen benign  MSK no focal spinal tenderness, no peripheral edema  Neuro nonfocal    CBG (last 3)  No results for input(s): GLUCAP in the last 72 hours.   Labs:  Lab Results  Component Value Date   WBC 5.9 12/30/2017   HGB 10.6 (L) 12/30/2017   HCT 32.0 (L) 12/30/2017   MCV 102.2 (H) 12/30/2017   PLT 177 12/30/2017   NEUTROABS 5.0 12/30/2017    CMP Latest Ref Rng & Units 12/30/2017 12/15/2017 12/13/2017  Glucose 65 - 99 mg/dL 126(H) 92 153(H)  BUN 6 - 20 mg/dL 17 22(H) 19  Creatinine 0.44 - 1.00 mg/dL 1.01(H) 0.74 0.74  Sodium 135 - 145 mmol/L 138 135 139  Potassium 3.5 - 5.1 mmol/L 4.3 4.1 3.7  Chloride 101 - 111 mmol/L 103 102 106  CO2 22 - 32 mmol/L 26 23 22   Calcium 8.9 - 10.3 mg/dL 8.9 8.6(L) 9.1  Total Protein 6.5 - 8.1 g/dL 6.4(L) - -  Total  Bilirubin 0.3 - 1.2 mg/dL 0.7 - -  Alkaline Phos 38 - 126 U/L 65 - -  AST 15 - 41 U/L 19 - -  ALT 14 - 54 U/L 16 - -     Urine Studies No results for input(s): UHGB, CRYS in the last 72 hours.  Invalid input(s): UACOL, UAPR, USPG, UPH, UTP, UGL, UKET, UBIL, UNIT, UROB, ULEU, UEPI, UWBC, URBC, UBAC, CAST, UCOM, BILUA  Basic Metabolic Panel: Recent Labs  Lab 12/30/17 2006  NA 138  K 4.3  CL 103  CO2 26  GLUCOSE 126*  BUN 17  CREATININE 1.01*  CALCIUM 8.9   GFR Estimated Creatinine Clearance: 39.1 mL/min (A) (by C-G formula based on SCr of 1.01 mg/dL (H)). Liver Function Tests: Recent Labs  Lab 12/30/17 2006  AST 19  ALT 16  ALKPHOS 65  BILITOT 0.7  PROT 6.4*  ALBUMIN 3.7   No results for input(s): LIPASE, AMYLASE in the last 168 hours. No results for input(s): AMMONIA in the last 168 hours. Coagulation profile Recent Labs  Lab 12/30/17 2006  INR 0.95    CBC: Recent Labs  Lab 12/30/17 2006  WBC 5.9  NEUTROABS 5.0  HGB 10.6*  HCT 32.0*  MCV 102.2*  PLT 177   Cardiac Enzymes: No results for input(s): CKTOTAL, CKMB, CKMBINDEX, TROPONINI in the last 168 hours. BNP: Invalid input(s): POCBNP CBG: No results for input(s): GLUCAP in the last 168 hours. D-Dimer No results for input(s): DDIMER in the last 72 hours. Hgb A1c No results for input(s): HGBA1C in the last 72 hours. Lipid Profile No results for input(s): CHOL, HDL, LDLCALC, TRIG, CHOLHDL, LDLDIRECT in the last 72 hours. Thyroid function studies No results for input(s): TSH, T4TOTAL, T3FREE, THYROIDAB in the last 72 hours.  Invalid input(s): FREET3 Anemia work up No results for input(s): VITAMINB12, FOLATE, FERRITIN, TIBC, IRON, RETICCTPCT in the last 72 hours. Microbiology Recent Results (from the past 240 hour(s))  Culture, blood (Routine x 2)     Status: None (Preliminary result)   Collection Time: 12/30/17  8:06 PM  Result Value Ref Range Status   Specimen Description BLOOD LEFT  ANTECUBITAL  Final   Special Requests   Final    BOTTLES DRAWN AEROBIC AND ANAEROBIC Blood Culture adequate volume Performed at [facility] [address], [place], [place] [address]    Culture NO GROWTH < 24 HOURS  Final   Report Status PENDING  Incomplete  Culture, blood (Routine x 2)     Status: None (Preliminary result)   Collection Time: 12/30/17  8:06 PM  Result Value Ref Range Status   Specimen Description   Final    BLOOD RIGHT ANTECUBITAL Performed at [facility] [address], [place], [place] [address]    Special Requests   Final    BOTTLES DRAWN AEROBIC AND ANAEROBIC Blood Culture results may not be optimal due to an excessive volume of blood received in culture bottles Performed at [facility] [address], [place], [place] [address]    Culture PENDING  Incomplete   Report Status PENDING  Incomplete    Studies:  Dg Chest 2 View  Result Date: 12/30/2017 CLINICAL DATA:  Cough and weakness for 72 hours. EXAM: CHEST - 2 VIEW COMPARISON:  12/12/2017 FINDINGS: Stable mild cardiac enlargement. There is mild aortic atherosclerosis without aneurysmal dilatation. Chronic left basilar atelectasis is noted. Blunting the posterior costophrenic angles may represent small posterior pleural effusions. Extensive chronic skeletal deformities are redemonstrated including multiple left-sided ribs, mid left clavicle, left scapula, healed displaced sternal fracture and widespread thoracic compression fractures with exaggerated thoracic kyphosis. No new findings are identified. IMPRESSION: 1. Chronic left basilar atelectasis. Probable small posterior pleural effusions month a posterior costophrenic angles. 2. Stable cardiomegaly with aortic atherosclerosis. 3. Extensive chronic skeletal deformities are redemonstrated including multiple left-sided ribs, mid left clavicle, left scapula, healed displaced sternal fracture and widespread thoracic  compression fractures with exaggerated thoracic kyphosis. Electronically Signed   By: Ashley Royalty M.D.   On: 12/30/2017 21:13   Ct Angio Chest Pe W And/or Wo Contrast  Result Date: 12/30/2017 CLINICAL DATA:  77 year old female with fever, cough, hypoxia. Multiple myeloma. EXAM: CT ANGIOGRAPHY CHEST WITH CONTRAST TECHNIQUE: Multidetector CT imaging of the chest was performed using the standard protocol during bolus administration of intravenous contrast. Multiplanar CT image reconstructions and MIPs were obtained to evaluate the vascular anatomy. CONTRAST:  174m ISOVUE-370 IOPAMIDOL (ISOVUE-370) INJECTION 76% COMPARISON:  Chest CTA 03/24/2016 FINDINGS: Cardiovascular: Good contrast bolus timing in the pulmonary arterial tree. No focal filling defect identified in the pulmonary arteries to suggest acute pulmonary embolism. Extensive calcified coronary artery atherosclerosis and/or stent. No pericardial effusion. Cardiac size at the  upper limits of normal. Negative visible aorta aside from atherosclerosis. Mediastinum/Nodes: No lymphadenopathy. Lungs/Pleura: Small volume retained secretions in the bronchus intermedius. The other major airways are patent. There is bronchiectasis, maximal in the right upper lobe. Chronic right apical consolidation/scarring is associated. There is left lower lobe posterior basal segment consolidation and confluent peribronchial opacity. Superimposed contralateral right lower lobe peribronchial patchy, tree-in-bud nodularity. Distal right lower lobe airway opacification. Mild associated consolidation. Mild bilateral middle lobe scarring appears stable. No pleural effusion. Upper Abdomen: Negative visible liver, spleen, pancreas, adrenal glands, kidneys, and bowel in the upper abdomen. Musculoskeletal: Osteopenia with scattered lytic lesions compatible with multiple myeloma. Chronic pathologic appearing fracture of the sternum. Numerous severe pathologic spinal compression fractures,  severe T5 through T9 and T12. Only the T10 and L2 vertebral bodies appear to remain normal in the visible thoracic and lumbar spine. Chronic fracture deformity of the distal left clavicle and occasional ribs. Review of the MIP images confirms the above findings. IMPRESSION: 1.  Negative for acute pulmonary embolus. 2. Bilateral lower lobe Pneumonia/bronchopneumonia. No pleural effusion. 3. Underlying Bronchiectasis. 4. Chronic multiple myeloma with numerous fractures throughout the thorax. 5. Calcified coronary artery atherosclerosis. Electronically Signed   By: Genevie Ann M.D.   On: 12/30/2017 23:30    Assessment: 77 y.o. female with history of multiple myeloma status post autotransplant in August 2014, recently had disease relapse, and switched to new chemotherapy treatment with pomalidomide, dexa and daratumumab    1. HCAP with hypoxic respiratory failure 2. Multiple myeloma, relapsed in July 2017, on chemo  3.  Previous multiple episodes of pneumonia, immunocompromised due to her chemotherapy. 4. Coronary artery disease, history of STEMI in August 2015 5. CHF     Plan:  -I agree with her antibiotics coverage and ID work up, appreciate the excellent care from the hospitalist team -Due to her immunocompromised status (on chronic steroids, and daratumumab infusion), opportunistic infection should be ruled out also. Consider ID consult if she does not improve quickly -Even if ID work up negative, I would favor a longer course of IV antibiotics for better coverage, due to her recurrent pneumonia.  -will f/u as needed.   [name], MD 12/31/2017  1:48 PM

## 2017-12-31 NOTE — Progress Notes (Signed)
Initial Nutrition Assessment  DOCUMENTATION CODES:   Underweight(Will assess for malnutrition at follow-up.)  INTERVENTION:  - Will order multivitamin with minerals.  - Will order evening snack. - Continue to encourage PO intakes.  NUTRITION DIAGNOSIS:   Increased nutrient needs related to catabolic illness, cancer and cancer related treatments as evidenced by estimated needs.  GOAL:   Patient will meet greater than or equal to 90% of their needs  MONITOR:   PO intake, Weight trends, Labs  REASON FOR ASSESSMENT:   Malnutrition Screening Tool  ASSESSMENT:   77 y.o. female with medical history significant for MM s/p stem cell transplant, nonischemic cardiomyopathy, CAD, who presented to the ED with complaints of shortness of breath cough and fever.  Shortness of breath started yesterday.  She recorded a fever up to 101.4 today.  Patient reports cough productive of whitish sputum present since discharge from hospital.  BMI indicates underweight status. Diet was liberalized from Heart Healthy to Regular after breakfast this AM. Patient ate 100% of breakfast which she reports was scrambled eggs, toast with margarine and jelly, orange juice, and coffee. She states constant underlying nausea but that this has not caused vomiting in a long time. She is unsure if it is worsened by PO intakes but states it affects her appetite and desire to eat. She also reports "muted" taste to all items which has been ongoing x5 years.   She does not notice appetite or taste sensation improving even when there are longer periods between chemo treatments. She had her last chemo on 5/16 and states her next appointment was for 6/21 but she is unsure if she will receive this treatment or not.   Asked patient about oral nutrition supplements, protein shakes, or go-to items when she is not hungry. She does not like ONS and prefers not to receive them. She greatly enjoys fruit.   Patient asked about what she  should do nutritionally to assist with healing and recovery. Encouraged adequate hydration and provided tips for this. Also encouraged eating at regular times throughout the day, even if she does not feel hungry (suggested always have fruit or other favorite or easy-to-eat items available), and to have a protein source each time she eats. Patient likes nuts and is disappointed these are not available as an option here. Informed patient of snack options available on the floor.   NFPE unable to be done at this time per pt preference; will attempt at follow-up. Per chart review, she has lost 10 lbs (8% body weight) in the past 1 month. This is significant for time frame. Highly suspect malnutrition but unable to identify based on ASPEN guidelines prior to performing NFPE.   Per Dr. Talmadge Coventry note overnight: HCAP, multiple myeloma and chest CT showed multiple rib fractures, cardiomyopathy.  Medications reviewed; 30 mg Deltasone/day. Labs reviewed.  IVF: NS @ 100 mL/hr.        NUTRITION - FOCUSED PHYSICAL EXAM:  Unable to complete at this time (per patient preference).  Diet Order:   Diet Order           Diet regular Room service appropriate? Yes; Fluid consistency: Thin  Diet effective now          EDUCATION NEEDS:   No education needs have been identified at this time  Skin:  Skin Assessment: Reviewed RN Assessment  Last BM:  6/18  Height:   Ht Readings from Last 1 Encounters:  12/31/17 5' 6.5" (1.689 m)    Weight:  Wt Readings from Last 1 Encounters:  12/31/17 113 lb 5.1 oz (51.4 kg)    Ideal Body Weight:  60.23 kg  BMI:  Body mass index is 18.02 kg/m.  Estimated Nutritional Needs:   Kcal:  1545-1800 (30-35 kcal/kg)  Protein:  77-87 grams (1.5-1.7 grams/kg)  Fluid:  >/= 1.7 L/day     Jarome Matin, MS, RD, LDN, [facility] Inpatient Clinical Dietitian Pager # [phone] After hours/weekend pager # [phone]

## 2018-01-01 DIAGNOSIS — R0902 Hypoxemia: Secondary | ICD-10-CM

## 2018-01-01 LAB — URINE CULTURE: CULTURE: NO GROWTH

## 2018-01-01 LAB — BASIC METABOLIC PANEL
Anion gap: 5 (ref 5–15)
BUN: 15 mg/dL (ref 6–20)
CALCIUM: 8.3 mg/dL — AB (ref 8.9–10.3)
CO2: 25 mmol/L (ref 22–32)
Chloride: 107 mmol/L (ref 101–111)
Creatinine, Ser: 0.63 mg/dL (ref 0.44–1.00)
GFR calc Af Amer: >60 mL/min (ref >60)
GLUCOSE: 116 mg/dL — AB (ref 65–99)
Potassium: 3.9 mmol/L (ref 3.5–5.1)
SODIUM: 137 mmol/L (ref 135–145)

## 2018-01-01 LAB — CBC
HCT: 27.7 % — ABNORMAL LOW (ref 36.0–46.0)
Hemoglobin: 9.1 g/dL — ABNORMAL LOW (ref 12.0–15.0)
MCH: 33.8 pg (ref 26.0–34.0)
MCHC: 32.9 g/dL (ref 30.0–36.0)
MCV: 103 fL — ABNORMAL HIGH (ref 78.0–100.0)
Platelets: 177 10*3/uL (ref 150–400)
RBC: 2.69 MIL/uL — ABNORMAL LOW (ref 3.87–5.11)
RDW: 15.2 % (ref 11.5–15.5)
WBC: 4.7 10*3/uL (ref 4.0–10.5)

## 2018-01-01 LAB — LEGIONELLA PNEUMOPHILA SEROGP 1 UR AG: L. PNEUMOPHILA SEROGP 1 UR AG: NEGATIVE

## 2018-01-01 NOTE — Progress Notes (Signed)
[facility] Progress Note  Patient: Shannon Rush IEP:[id number]   PCP: [name], MD DOB: Nov 29, 1940   DOA: 12/30/2017   DOS: 01/01/2018   Date of Service: the patient was seen and examined on 01/01/2018  Subjective: Continues to have cough and shortness of breath.  Patient is disappointed that she does not have similar improvement in her condition over last 24 hours as she had in the 24 hours before that between her ER arrival and initial treatment. No cough with blood.  Brief hospital course: Pt. with PMH of liver myeloma, S/P stem cell transplant, on chemotherapy, NICM, CAD, recent hospitalization for pneumonia; admitted on 12/30/2017, presented with complaint of shortness of breath cough and fever, was found to have recurrent pneumonia. Currently further plan is continue current treatment..  Assessment and Plan: 1.  Recurrent bilateral pneumonia. Acute hypoxic respiratory failure with bronchospasm.  Probably from bronchiolitis. Presented with cough and shortness of breath.  Patient was tachypneic as well as tachycardic on admission. CT scan of the chest was performed which is showing bilateral lower lobe atelectasis versus pneumonia. Recently hospitalized for the same and was treated with a cephalosporin and doxycycline and discharged home on cephalosporin. At present her wheezing has improved significantly as well her cultures are remaining negative. Her oxygenation is also improved as well as pre-breathing. Given this I believe that the patient is responding to the current treatment and for which I will continue doxycycline and cefepime. Follow-up on blood cultures for 1 more day before considering option for discharge. Still pending urine Legionella antigen.  Urine strep pneumo antigen is negative. Patient needs more of an atypical coverage for long-term, option to cover for strep are limited due to patient's allergy. Continue steroids as well.  Continue nebulizers as  well.  Discontinue IV fluids. Although there is a consideration for possible opportunistic infection patient's CT scan of the chest essentially is unchanged from her recent pneumonia treatment. Would not pursue any further work-up since the patient is actually showing clinical improvement at present.  2.  Multiple myeloma. H&H relatively stable. Follows up with [facility] and Dr. Annamaria Boots. Continue close monitoring, chemotherapy currently on hold.  3.  Chronic diastolic CHF. Echo in December 2019 shows preserved EF. Continue low-dose metoprolol.  4.  Compression fractures of spine as well as ribs. Secondary to her multiple myeloma. At present monitor.  Diet: regular diet DVT Prophylaxis: subcutaneous Heparin  Advance goals of care discussion: DNR DNI3  Family Communication: family was present at bedside, at the time of interview. The pt provided permission to discuss medical plan with the family. Opportunity was given to ask question and all questions were answered satisfactorily.   Disposition:  Discharge to home.  Consultants: none Procedures: none  Antibiotics: Anti-infectives (From admission, onward)   Start     Dose/Rate Route Frequency Ordered Stop   01/01/18 0600  vancomycin (VANCOCIN) IVPB 750 mg/150 ml premix  Status:  Discontinued     750 mg 150 mL/hr over 60 Minutes Intravenous Every 36 hours 12/31/17 0334 12/31/17 0900   12/31/17 2200  ceFEPIme (MAXIPIME) 1 g in sodium chloride 0.9 % 100 mL IVPB     1 g 200 mL/hr over 30 Minutes Intravenous Every 12 hours 12/31/17 1135     12/31/17 1000  meropenem (MERREM) 1 g in sodium chloride 0.9 % 100 mL IVPB  Status:  Discontinued     1 g 200 mL/hr over 30 Minutes Intravenous Every 12 hours 12/31/17 0334 12/31/17 1135  12/31/17 0215  acyclovir (ZOVIRAX) tablet 400 mg     400 mg Oral 2 times daily 12/31/17 0214     12/31/17 0145  doxycycline (VIBRAMYCIN) 100 mg in sodium chloride 0.9 % 250 mL IVPB     100 mg 125 mL/hr over 120  Minutes Intravenous 2 times daily 12/31/17 0127     12/31/17 0115  vancomycin (VANCOCIN) IVPB 1000 mg/200 mL premix     1,000 mg 200 mL/hr over 60 Minutes Intravenous  Once 12/31/17 0113 12/31/17 0401   12/31/17 0000  meropenem (MERREM) 1 g in sodium chloride 0.9 % 100 mL IVPB     1 g 200 mL/hr over 30 Minutes Intravenous  Once 12/30/17 2345 12/31/17 0100       Objective: Physical Exam: Vitals:   12/31/17 1559 12/31/17 2110 01/01/18 0623 01/01/18 1314  BP:  90/69 100/65 (!) 100/56  Pulse:  88 92 88  Resp:  18 18 16   Temp:  97.7 F (36.5 C)  98 F (36.7 C)  TempSrc:  Oral    SpO2: 91% 95% 93% 94%  Weight:      Height:        Intake/Output Summary (Last 24 hours) at 01/01/2018 1700 Last data filed at 01/01/2018 1330 Gross per 24 hour  Intake 1200 ml  Output 700 ml  Net 500 ml   Filed Weights   12/31/17 0214  Weight: 51.4 kg (113 lb 5.1 oz)   General: Alert, Awake and Oriented to Time, Place and Person. Appear in moderate distress, affect appropriate Eyes: PERRL, Conjunctiva normal ENT: Oral Mucosa clear moist. Neck: no JVD, ono Abnormal Mass Or lumps Cardiovascular: S1 and S2 Present, aortic systolic  Murmur, Peripheral Pulses Present Respiratory: normal respiratory effort, Bilateral Air entry equal and Decreased, no use of accessory muscle, bilateral  Crackles, expiratory wheezes Abdomen: Bowel Sound present, Soft and no tenderness, no hernia Skin: no redness, no Rash, no induration Extremities: no Pedal edema, no calf tenderness Neurologic: Grossly no focal neuro deficit. Bilaterally Equal motor strength  Data Reviewed: CBC: Recent Labs  Lab 12/30/17 2006 01/01/18 0523  WBC 5.9 4.7  NEUTROABS 5.0  --   HGB 10.6* 9.1*  HCT 32.0* 27.7*  MCV 102.2* 103.0*  PLT 177 250   Basic Metabolic Panel: Recent Labs  Lab 12/30/17 2006 01/01/18 0523  NA 138 137  K 4.3 3.9  CL 103 107  CO2 26 25  GLUCOSE 126* 116*  BUN 17 15  CREATININE 1.01* 0.63  CALCIUM 8.9  8.3*    Liver Function Tests: Recent Labs  Lab 12/30/17 2006  AST 19  ALT 16  ALKPHOS 65  BILITOT 0.7  PROT 6.4*  ALBUMIN 3.7   No results for input(s): LIPASE, AMYLASE in the last 168 hours. No results for input(s): AMMONIA in the last 168 hours. Coagulation Profile: Recent Labs  Lab 12/30/17 2006  INR 0.95   Cardiac Enzymes: No results for input(s): CKTOTAL, CKMB, CKMBINDEX, TROPONINI in the last 168 hours. BNP (last 3 results) No results for input(s): PROBNP in the last 8760 hours. CBG: No results for input(s): GLUCAP in the last 168 hours. Studies: No results found.  Scheduled Meds: . acyclovir  400 mg Oral BID  . atorvastatin  20 mg Oral Daily  . dextromethorphan  15 mg Oral QHS  . enoxaparin (LOVENOX) injection  40 mg Subcutaneous Daily  . guaiFENesin-codeine  5 mL Oral TID  . mouth rinse  15 mL Mouth Rinse BID  . multivitamin  with minerals  1 tablet Oral Daily  . predniSONE  30 mg Oral Q breakfast   Continuous Infusions: . ceFEPime (MAXIPIME) IV Stopped (01/01/18 1030)  . doxycycline (VIBRAMYCIN) IV Stopped (01/01/18 1207)   PRN Meds: acetaminophen **OR** acetaminophen, fluticasone, ipratropium-albuterol, ondansetron **OR** ondansetron (ZOFRAN) IV  Time spent: 35 minutes  Author: Berle Mull, MD [facility] Pager: [phone] 01/01/2018 5:00 PM  If 7PM-7AM, please contact night-coverage at [email or web address], password [facility]

## 2018-01-02 ENCOUNTER — Inpatient Hospital Stay: Payer: Medicare Other | Admitting: Hematology

## 2018-01-02 ENCOUNTER — Inpatient Hospital Stay: Payer: Medicare Other

## 2018-01-02 LAB — BASIC METABOLIC PANEL
Anion gap: 6 (ref 5–15)
BUN: 16 mg/dL (ref 6–20)
CHLORIDE: 112 mmol/L — AB (ref 101–111)
CO2: 23 mmol/L (ref 22–32)
CREATININE: 0.74 mg/dL (ref 0.44–1.00)
Calcium: 8.5 mg/dL — ABNORMAL LOW (ref 8.9–10.3)
GFR calc Af Amer: >60 mL/min (ref >60)
GFR calc non Af Amer: >60 mL/min (ref >60)
Glucose, Bld: 92 mg/dL (ref 65–99)
Potassium: 3.8 mmol/L (ref 3.5–5.1)
Sodium: 141 mmol/L (ref 135–145)

## 2018-01-02 LAB — CBC
HCT: 26.3 % — ABNORMAL LOW (ref 36.0–46.0)
Hemoglobin: 9 g/dL — ABNORMAL LOW (ref 12.0–15.0)
MCH: 35.3 pg — AB (ref 26.0–34.0)
MCHC: 34.2 g/dL (ref 30.0–36.0)
MCV: 103.1 fL — AB (ref 78.0–100.0)
PLATELETS: 181 10*3/uL (ref 150–400)
RBC: 2.55 MIL/uL — AB (ref 3.87–5.11)
RDW: 15.2 % (ref 11.5–15.5)
WBC: 6 10*3/uL (ref 4.0–10.5)

## 2018-01-02 MED ORDER — POLYETHYLENE GLYCOL 3350 17 G PO PACK: 17.0000 g | PACK | Freq: Every day | ORAL | 0 refills | Status: DC | PRN

## 2018-01-02 MED ORDER — GUAIFENESIN-CODEINE 100-10 MG/5ML PO SOLN
5.0000 mL | Freq: Three times a day (TID) | ORAL | 0 refills | Status: AC
Start: 2018-01-02 — End: 2018-01-08

## 2018-01-02 MED ORDER — DOXYCYCLINE HYCLATE 100 MG PO TABS
100.0000 mg | ORAL_TABLET | Freq: Two times a day (BID) | ORAL | Status: DC
  Administered 2018-01-02: 100 mg via ORAL
  Filled 2018-01-02: qty 1

## 2018-01-02 MED ORDER — PREDNISONE 10 MG PO TABS: ORAL_TABLET | ORAL | 0 refills | Status: DC

## 2018-01-02 MED ORDER — BENZONATATE 100 MG PO CAPS: 100.0000 mg | ORAL_CAPSULE | Freq: Three times a day (TID) | ORAL | 0 refills | Status: DC | PRN

## 2018-01-02 MED ORDER — MENTHOL 3 MG MT LOZG
1.0000 | LOZENGE | OROMUCOSAL | Status: DC | PRN
  Filled 2018-01-02: qty 9

## 2018-01-02 MED ORDER — DEXTROMETHORPHAN POLISTIREX ER 30 MG/5ML PO SUER: 15.0000 mg | Freq: Every day | ORAL | 0 refills | Status: DC

## 2018-01-02 MED ORDER — ADULT MULTIVITAMIN W/MINERALS CH: 1.0000 | ORAL_TABLET | Freq: Every day | ORAL | 0 refills | Status: DC

## 2018-01-02 MED ORDER — CEFDINIR 300 MG PO CAPS: 300.0000 mg | ORAL_CAPSULE | Freq: Two times a day (BID) | ORAL | 0 refills | Status: AC

## 2018-01-02 MED ORDER — DOXYCYCLINE HYCLATE 100 MG PO TABS: 100.0000 mg | ORAL_TABLET | Freq: Two times a day (BID) | ORAL | 0 refills | Status: AC

## 2018-01-02 MED ORDER — MENTHOL 3 MG MT LOZG: 1.0000 | LOZENGE | OROMUCOSAL | 12 refills | Status: DC | PRN

## 2018-01-02 NOTE — Progress Notes (Signed)
Pt discharged from the unit via wheelchair. Discharge instructions were reviewed with patient and family member prior to discharge. No questions or concerns at this time.

## 2018-01-02 NOTE — Evaluation (Signed)
Physical Therapy Evaluation Patient Details Name: Shannon Rush MRN: [id number] DOB: [birth year] Today's Date: 01/02/2018   History of Present Illness   77 y.o. female with medical history significant for MM s/p stem cell transplant, nonischemic cardiomyopathy, CAD, who presented to the ED with complaints of shortness of breath cough and fever.  Clinical Impression  Pt is independent with mobility, no further PT indicated. She ambulated 400' without an assistive device, no loss of balance, HR 122 max, SaO2 88-90% on room air, 2/4 dyspnea. Encouraged pt to ambulate in halls TID. PT signing off.     Follow Up Recommendations No PT follow up    Equipment Recommendations  None recommended by PT    Recommendations for Other Services       Precautions / Restrictions Precautions Precautions: None Restrictions Weight Bearing Restrictions: No      Mobility  Bed Mobility Overal bed mobility: Independent                Transfers Overall transfer level: Independent                  Ambulation/Gait Ambulation/Gait assistance: Independent Gait Distance (Feet): 400 Feet Assistive device: None Gait Pattern/deviations: WFL(Within Functional Limits) Gait velocity: WNL   General Gait Details: steady, no loss of balance, HR 122 max with walking,  2/4 dyspnea, SaO2 88-90% on room air  Stairs            Wheelchair Mobility    Modified Rankin (Stroke Patients Only)       Balance Overall balance assessment: Independent                                           Pertinent Vitals/Pain Pain Assessment: No/denies pain    Home Living Family/patient expects to be discharged to:: Private residence Living Arrangements: Spouse/significant other Available Help at Discharge: Family;Available 24 hours/day Type of Home: House Home Access: Stairs to enter     Home Layout: Two level Home Equipment: None      Prior Function Level of Independence:  Independent         Comments: very active PTA, rides bike and walks      Hand Dominance        Extremity/Trunk Assessment   Upper Extremity Assessment Upper Extremity Assessment: Overall WFL for tasks assessed    Lower Extremity Assessment Lower Extremity Assessment: Overall WFL for tasks assessed    Cervical / Trunk Assessment Cervical / Trunk Assessment: Normal  Communication   Communication: No difficulties  Cognition Arousal/Alertness: Awake/alert Behavior During Therapy: WFL for tasks assessed/performed Overall Cognitive Status: Within Functional Limits for tasks assessed                                        General Comments      Exercises     Assessment/Plan    PT Assessment Patent does not need any further PT services  PT Problem List         PT Treatment Interventions      PT Goals (Current goals can be found in the Care Plan section)  Acute Rehab PT Goals PT Goal Formulation: All assessment and education complete, DC therapy    Frequency     Barriers to discharge  Co-evaluation               AM-PAC PT "6 Clicks" Daily Activity  Outcome Measure Difficulty turning over in bed (including adjusting bedclothes, sheets and blankets)?: None Difficulty moving from lying on back to sitting on the side of the bed? : None Difficulty sitting down on and standing up from a chair with arms (e.g., wheelchair, bedside commode, etc,.)?: None Help needed moving to and from a bed to chair (including a wheelchair)?: None Help needed walking in hospital room?: None Help needed climbing 3-5 steps with a railing? : None 6 Click Score: 24    End of Session Equipment Utilized During Treatment: Gait belt Activity Tolerance: Patient tolerated treatment well Patient left: in chair;with call bell/phone within reach;with family/visitor present Nurse Communication: Mobility status      Time: 1914-7829 PT Time Calculation (min)  (ACUTE ONLY): 17 min   Charges:   PT Evaluation $PT Eval Low Complexity: 1 Low     PT G Codes:          Philomena Doheny 01/02/2018, 9:24 AM [phone]

## 2018-01-04 LAB — CULTURE, BLOOD (ROUTINE X 2)
Culture: NO GROWTH
Special Requests: ADEQUATE

## 2018-01-04 NOTE — Discharge Summary (Signed)
[facility] Discharge Summary   Patient: Shannon Rush ZOX:[id number]   PCP: [name], MD DOB: [birth year]   Date of admission: 12/30/2017   Date of discharge: 01/02/2018    Discharge Diagnoses:  Active Problems:   Multiple myeloma ([place])   History of STEMI-Aug 2015- secondary to thrombotic state from chemo   HCAP (healthcare-associated pneumonia)   Hypoxia   Admitted From: home Disposition:  home  Recommendations for Outpatient Follow-up:  1. Please  follow-up with PCP in 1 week.  Follow-up Information    [name], Jamal Collin, MD. Schedule an appointment as soon as possible for a visit in 1 week(s).   Specialty:  Family Medicine Contact information: [place] [address] [phone]          Diet recommendation: regular diet  Activity: The patient is advised to gradually reintroduce usual activities.  Discharge Condition: good  Code Status: DNR DNI  History of present illness: As per the H and P dictated on admission, "Shannon Rush is a 77 y.o. female with medical history significant for MM s/p stem cell transplant, nonischemic cardiomyopathy, CAD, who presented to the ED with complaints of shortness of breath cough and fever.  Shortness of breath started yesterday.  She recorded a fever up to 101.4 today.  Patient reports cough productive of whitish sputum present since discharge from hospital.  Recent hospital admission- 5/31-12/15/17-for community-acquired pneumonia.  She had just come back from a cruise to [place] within the month. Patient was treated with IV ceftriaxone and doxycycline 3 days inpatient, patient sent home to complete 5 more days of antibiotics.  Patient reports she took the Keflex as prescribed, and states this was the only antibiotic she was prescribed.  ED Course: Temp 99.9 recorded in ED, heart rate initially 114, improved to 93, O2 sats 81% on room air, improved to >96% on 3 L nasal cannula.  WBC 5.9.   Hemoglobin stable 10.6.  Lactic acid -0.7, i-STAT troponin negative , BNP 176. UA pending.  Chest x-ray was negative for acute abnormality subsequent CTA chest-negative for PE, bilateral lower lobe pneumonia/bronchopneumonia, underlying bronchiectasis.  Chronic multiple myeloma with numerous fractures throughout the thorax.  Patient was started on IV meropenem in ED (in consult with pharmacy , considering multiple allergies).  Hospitalist was called to admit for HCAP."  Hospital Course:  Summary of her active problems in the hospital is as following. 1.  Recurrent bilateral pneumonia. Acute hypoxic respiratory failure with bronchospasm.  Probably from bronchiolitis. Presented with cough and shortness of breath.  Patient was tachypneic as well as tachycardic on admission. CT scan of the chest was performed which is showing bilateral lower lobe atelectasis versus pneumonia. Recently hospitalized for the same and was treated with a cephalosporin and doxycycline and discharged home on cephalosporin. At present her wheezing has improved significantly as well her cultures are remaining negative. Her oxygenation is also improved as well as breathing. Given this I believe that the patient is responding to the current treatment and for which I will continue doxycycline and cefdnir  Urine strep pneumo antigen is negative. Patient needs more of an atypical coverage for long-term, option to cover for strep are limited due to patient's allergy. Continue steroids as well.  Although there is a consideration for possible opportunistic infection patient's CT scan of the chest essentially is unchanged from her recent pneumonia treatment. Would not pursue any further work-up since the patient is actually showing clinical improvement at present.  2.  Multiple myeloma. H&H relatively stable. Follows up with [facility] and Dr. Annamaria Boots. Continue close monitoring, chemotherapy currently on hold.  3.  Chronic diastolic  CHF. Echo in December 2019 shows preserved EF. Continue low-dose metoprolol.  4.  Compression fractures of spine as well as ribs. Secondary to her multiple myeloma. At present monitor.  All other chronic medical condition were stable during the hospitalization.  Patient was ambulatory without any assistance. On the day of the discharge the patient's vitals were stable , and no other acute medical condition were reported by patient. the patient was felt safe to be discharge at home with family.  Consultants: none Procedures: none  DISCHARGE MEDICATION: Allergies as of 01/02/2018      Reactions   Zithromax [azithromycin] Rash   Full body rash   Zosyn [piperacillin Sod-tazobactam So] Swelling, Rash   Itching, rash and swelling of extremities Tolerates Rocephin   Quinolones Rash   Cipro, Levaquin implicated Cipro also caused vomiting and abd pain      Medication List    TAKE these medications   acetaminophen 500 MG tablet Commonly known as:  TYLENOL Take 500 mg by mouth every 6 (six) hours as needed for moderate pain or fever.   acyclovir 400 MG tablet Commonly known as:  ZOVIRAX Take 1 tablet (400 mg total) by mouth 2 (two) times daily.   atorvastatin 20 MG tablet Commonly known as:  LIPITOR Take 1 tablet (20 mg total) by mouth daily.   benzonatate 100 MG capsule Commonly known as:  TESSALON PERLES Take 1 capsule (100 mg total) by mouth 3 (three) times daily as needed for cough.   calcium carbonate 600 MG Tabs tablet Commonly known as:  OS-CAL Take 600 mg by mouth daily.   cefdinir 300 MG capsule Commonly known as:  OMNICEF Take 1 capsule (300 mg total) by mouth 2 (two) times daily for 4 days.   dexamethasone 4 MG tablet Commonly known as:  DECADRON Take 5 tab the day before chemo, every 2 weeks   dextromethorphan 30 MG/5ML liquid Commonly known as:  DELSYM Take 2.5 mLs (15 mg total) by mouth at bedtime.   doxycycline 100 MG tablet Commonly known as:   VIBRA-TABS Take 1 tablet (100 mg total) by mouth 2 (two) times daily for 6 days.   fluticasone 50 MCG/ACT nasal spray Commonly known as:  FLONASE Place 2 sprays into both nostrils daily. What changed:    when to take this  reasons to take this   guaiFENesin-codeine 100-10 MG/5ML syrup Take 5 mLs by mouth 3 (three) times daily for 6 days.   menthol-cetylpyridinium 3 MG lozenge Commonly known as:  CEPACOL Take 1 lozenge (3 mg total) by mouth as needed for sore throat.   metoprolol succinate 25 MG 24 hr tablet Commonly known as:  TOPROL-XL Take 1 tablet (25 mg total) by mouth daily.   multivitamin with minerals Tabs tablet Take 1 tablet by mouth daily.   NYQUIL PO Take 30 mLs by mouth at bedtime as needed (cough).   polyethylene glycol packet Commonly known as:  MIRALAX Take 17 g by mouth daily as needed for mild constipation.   predniSONE 10 MG tablet Commonly known as:  DELTASONE Take 18m daily for 3days,Take 2103mdaily for 3days,Take 1042maily for 3days, then stop.      Allergies  Allergen Reactions  . Zithromax [Azithromycin] Rash    Full body rash  . Zosyn [Piperacillin Sod-Tazobactam So] Swelling and Rash    Itching, rash  and swelling of extremities Tolerates Rocephin  . Quinolones Rash    Cipro, Levaquin implicated Cipro also caused vomiting and abd pain   Discharge Instructions    Diet general   Complete by:  As directed    Discharge instructions   Complete by:  As directed    It is important that you read following instructions as well as go over your medication list with RN to help you understand your care after this hospitalization.  Discharge Instructions: Please follow-up with PCP in one week  Please request your primary care physician to go over all Hospital Tests and Procedure/Radiological results at the follow up,  Please get all Hospital records sent to your PCP by signing hospital release before you go home.   Do not drive, operating  heavy machinery, perform activities at heights, swimming or participation in water activities or provide baby sitting services; until you have been seen by Primary Care Physician or a Neurologist and advised to do so again. Do not take more than prescribed Pain, Sleep and Anxiety Medications. You were cared for by a hospitalist during your hospital stay. If you have any questions about your discharge medications or the care you received while you were in the hospital after you are discharged, you can call the unit and ask to speak with the hospitalist on call if the hospitalist that took care of you is not available.  Once you are discharged, your primary care physician will handle any further medical issues. Please note that NO REFILLS for any discharge medications will be authorized once you are discharged, as it is imperative that you return to your primary care physician (or establish a relationship with a primary care physician if you do not have one) for your aftercare needs so that they can reassess your need for medications and monitor your lab values. You Must read complete instructions/literature along with all the possible adverse reactions/side effects for all the Medicines you take and that have been prescribed to you. Take any new Medicines after you have completely understood and accept all the possible adverse reactions/side effects. Wear Seat belts while driving. If you have smoked or chewed Tobacco in the last 2 yrs please stop smoking and/or stop any Recreational drug use.   Increase activity slowly   Complete by:  As directed      Discharge Exam: Filed Weights   12/31/17 0214  Weight: 51.4 kg (113 lb 5.1 oz)   Vitals:   01/02/18 0450 01/02/18 0913  BP: 112/65   Pulse: 88   Resp: 18   Temp: 98.2 F (36.8 C)   SpO2: 92% 91%   General: Appear in no distress, no Rash; Oral Mucosa moist. Cardiovascular: S1 and S2 Present, no Murmur, no JVD Respiratory: Bilateral Air entry  present and faint basal Crackles, Occasional  wheezes Abdomen: Bowel Sound present, Soft and no tenderness Extremities: no Pedal edema, no calf tenderness Neurology: Grossly no focal neuro deficit.  The results of significant diagnostics from this hospitalization (including imaging, microbiology, ancillary and laboratory) are listed below for reference.    Significant Diagnostic Studies: Dg Chest 2 View  Result Date: 12/30/2017 CLINICAL DATA:  Cough and weakness for 72 hours. EXAM: CHEST - 2 VIEW COMPARISON:  12/12/2017 FINDINGS: Stable mild cardiac enlargement. There is mild aortic atherosclerosis without aneurysmal dilatation. Chronic left basilar atelectasis is noted. Blunting the posterior costophrenic angles may represent small posterior pleural effusions. Extensive chronic skeletal deformities are redemonstrated including multiple left-sided ribs, mid left  clavicle, left scapula, healed displaced sternal fracture and widespread thoracic compression fractures with exaggerated thoracic kyphosis. No new findings are identified. IMPRESSION: 1. Chronic left basilar atelectasis. Probable small posterior pleural effusions month a posterior costophrenic angles. 2. Stable cardiomegaly with aortic atherosclerosis. 3. Extensive chronic skeletal deformities are redemonstrated including multiple left-sided ribs, mid left clavicle, left scapula, healed displaced sternal fracture and widespread thoracic compression fractures with exaggerated thoracic kyphosis. Electronically Signed   By: Ashley Royalty M.D.   On: 12/30/2017 21:13   Dg Chest 2 View  Result Date: 12/12/2017 CLINICAL DATA:  77 year old female with cough and chest pain for 6 days. EXAM: CHEST - 2 VIEW COMPARISON:  06/28/2016 and earlier. FINDINGS: Stable lung volumes. Streaky in confluent medial left lung base opacity on the frontal view is correlated with vague lower lung density on the lateral. Small if any associated pleural effusion. No  pneumothorax or pulmonary edema. Lung parenchyma elsewhere appears stable. Stable cardiomegaly. Other mediastinal contours are within normal limits. Visualized tracheal air column is within normal limits. Extensive chronic skeletal deformities in the chest including chronically displaced sternal fracture, chronic rib and left clavicle fractures, and widespread thoracic compression fractures with exaggerated thoracic kyphosis. No acute osseous abnormality identified. Paucity bowel gas in the upper abdomen. IMPRESSION: 1. New abnormal left lower lung opacity is nonspecific but most suggestive of pneumonia. Trace if any associated pleural effusion. If the patient shows clinical signs of infection then followup PA and lateral chest X-ray is recommended in 3-4 weeks following trial of antibiotic therapy to ensure resolution and exclude underlying malignancy. Otherwise chest CT could further characterize (IV contrast preferred but noncontrast may suffice). 2. Otherwise stable chest including numerous chronic thoracic skeletal deformities. Electronically Signed   By: Genevie Ann M.D.   On: 12/12/2017 14:02   Ct Angio Chest Pe W And/or Wo Contrast  Result Date: 12/30/2017 CLINICAL DATA:  77 year old female with fever, cough, hypoxia. Multiple myeloma. EXAM: CT ANGIOGRAPHY CHEST WITH CONTRAST TECHNIQUE: Multidetector CT imaging of the chest was performed using the standard protocol during bolus administration of intravenous contrast. Multiplanar CT image reconstructions and MIPs were obtained to evaluate the vascular anatomy. CONTRAST:  173m ISOVUE-370 IOPAMIDOL (ISOVUE-370) INJECTION 76% COMPARISON:  Chest CTA 03/24/2016 FINDINGS: Cardiovascular: Good contrast bolus timing in the pulmonary arterial tree. No focal filling defect identified in the pulmonary arteries to suggest acute pulmonary embolism. Extensive calcified coronary artery atherosclerosis and/or stent. No pericardial effusion. Cardiac size at the upper limits  of normal. Negative visible aorta aside from atherosclerosis. Mediastinum/Nodes: No lymphadenopathy. Lungs/Pleura: Small volume retained secretions in the bronchus intermedius. The other major airways are patent. There is bronchiectasis, maximal in the right upper lobe. Chronic right apical consolidation/scarring is associated. There is left lower lobe posterior basal segment consolidation and confluent peribronchial opacity. Superimposed contralateral right lower lobe peribronchial patchy, tree-in-bud nodularity. Distal right lower lobe airway opacification. Mild associated consolidation. Mild bilateral middle lobe scarring appears stable. No pleural effusion. Upper Abdomen: Negative visible liver, spleen, pancreas, adrenal glands, kidneys, and bowel in the upper abdomen. Musculoskeletal: Osteopenia with scattered lytic lesions compatible with multiple myeloma. Chronic pathologic appearing fracture of the sternum. Numerous severe pathologic spinal compression fractures, severe T5 through T9 and T12. Only the T10 and L2 vertebral bodies appear to remain normal in the visible thoracic and lumbar spine. Chronic fracture deformity of the distal left clavicle and occasional ribs. Review of the MIP images confirms the above findings. IMPRESSION: 1.  Negative for acute pulmonary embolus. 2.  Bilateral lower lobe Pneumonia/bronchopneumonia. No pleural effusion. 3. Underlying Bronchiectasis. 4. Chronic multiple myeloma with numerous fractures throughout the thorax. 5. Calcified coronary artery atherosclerosis. Electronically Signed   By: Genevie Ann M.D.   On: 12/30/2017 23:30    Microbiology: Recent Results (from the past 240 hour(s))  Culture, blood (Routine x 2)     Status: None (Preliminary result)   Collection Time: 12/30/17  8:06 PM  Result Value Ref Range Status   Specimen Description   Final    BLOOD LEFT ANTECUBITAL Performed at [facility] [address] [place], [place] [address]     Special Requests   Final    BOTTLES DRAWN AEROBIC AND ANAEROBIC Blood Culture adequate volume Performed at [facility] [address] [place], [place] [address]    Culture   Final    NO GROWTH 4 DAYS Performed at [facility], [place] [address], [place], [place] [address]    Report Status PENDING  Incomplete  Culture, blood (Routine x 2)     Status: None (Preliminary result)   Collection Time: 12/30/17  8:06 PM  Result Value Ref Range Status   Specimen Description   Final    BLOOD RIGHT ANTECUBITAL Performed at [facility] [address], [place], [place] [address]    Special Requests   Final    BOTTLES DRAWN AEROBIC AND ANAEROBIC Blood Culture results may not be optimal due to an excessive volume of blood received in culture bottles   Culture   Final    NO GROWTH 3 DAYS Performed at [facility], [place] [address] [place], [place] [address]    Report Status PENDING  Incomplete  Urine culture     Status: None   Collection Time: 12/30/17 10:14 PM  Result Value Ref Range Status   Specimen Description   Final    URINE, CLEAN CATCH Performed at [facility], [place] [address] [place], [place] [address]    Special Requests   Final    NONE Performed at [facility], [place] [address] [place], [place] [address]    Culture   Final    NO GROWTH Performed at [facility], [place] [address] [place],  [address]    Report Status 01/01/2018 FINAL  Final     Labs: CBC: Recent Labs  Lab 12/30/17 2006 01/01/18 0523 01/02/18 0454  WBC 5.9 4.7 6.0  NEUTROABS 5.0  --   --   HGB 10.6* 9.1* 9.0*  HCT 32.0* 27.7* 26.3*  MCV 102.2* 103.0* 103.1*  PLT 177 177 812   Basic Metabolic Panel: Recent Labs  Lab 12/30/17 2006 01/01/18 0523 01/02/18 0454  NA 138 137 141  K 4.3 3.9 3.8  CL 103 107 112*  CO2 26 25 23   GLUCOSE 126* 116* 92  BUN 17 15 16   CREATININE 1.01* 0.63 0.74  CALCIUM  8.9 8.3* 8.5*   Liver Function Tests: Recent Labs  Lab 12/30/17 2006  AST 19  ALT 16  ALKPHOS 65  BILITOT 0.7  PROT 6.4*  ALBUMIN 3.7   No results for input(s): LIPASE, AMYLASE in the last 168 hours. No results for input(s): AMMONIA in the last 168 hours. Cardiac Enzymes: No results for input(s): CKTOTAL, CKMB, CKMBINDEX, TROPONINI in the last 168 hours. BNP (last 3 results) Recent Labs    12/30/17 2009  BNP 176.4*   CBG: No results for input(s): GLUCAP in the last  168 hours. Time spent: 35 minutes  Signed:  Berle Mull  [facility] 01/02/2018 , 8:28 AM

## 2018-01-05 LAB — CULTURE, BLOOD (ROUTINE X 2): CULTURE: NO GROWTH

## 2018-01-08 ENCOUNTER — Telehealth: Payer: Self-pay | Admitting: Hematology

## 2018-01-08 NOTE — Telephone Encounter (Signed)
Called pt re appts that were added per per 6/26 sch msg - spoke w/ pt and confirmed

## 2018-01-09 ENCOUNTER — Encounter: Payer: Self-pay | Admitting: Hematology

## 2018-01-09 ENCOUNTER — Telehealth: Payer: Self-pay | Admitting: Hematology

## 2018-01-09 ENCOUNTER — Inpatient Hospital Stay (HOSPITAL_BASED_OUTPATIENT_CLINIC_OR_DEPARTMENT_OTHER): Payer: Medicare Other | Admitting: Hematology

## 2018-01-09 ENCOUNTER — Inpatient Hospital Stay: Payer: Medicare Other

## 2018-01-09 ENCOUNTER — Inpatient Hospital Stay: Payer: Medicare Other | Attending: Hematology

## 2018-01-09 VITALS — BP 117/76 | HR 86 | Temp 98.9°F | Resp 16 | Ht 66.5 in | Wt 116.9 lb

## 2018-01-09 VITALS — BP 100/52 | HR 67 | Temp 97.7°F | Resp 16

## 2018-01-09 DIAGNOSIS — C9002 Multiple myeloma in relapse: Secondary | ICD-10-CM | POA: Insufficient documentation

## 2018-01-09 DIAGNOSIS — I2510 Atherosclerotic heart disease of native coronary artery without angina pectoris: Secondary | ICD-10-CM | POA: Diagnosis not present

## 2018-01-09 DIAGNOSIS — G620 Drug-induced polyneuropathy: Secondary | ICD-10-CM

## 2018-01-09 DIAGNOSIS — T451X5A Adverse effect of antineoplastic and immunosuppressive drugs, initial encounter: Secondary | ICD-10-CM | POA: Insufficient documentation

## 2018-01-09 DIAGNOSIS — D61818 Other pancytopenia: Secondary | ICD-10-CM

## 2018-01-09 DIAGNOSIS — I252 Old myocardial infarction: Secondary | ICD-10-CM

## 2018-01-09 DIAGNOSIS — R05 Cough: Secondary | ICD-10-CM | POA: Diagnosis not present

## 2018-01-09 DIAGNOSIS — D61810 Antineoplastic chemotherapy induced pancytopenia: Secondary | ICD-10-CM | POA: Diagnosis not present

## 2018-01-09 DIAGNOSIS — I509 Heart failure, unspecified: Secondary | ICD-10-CM | POA: Insufficient documentation

## 2018-01-09 DIAGNOSIS — M810 Age-related osteoporosis without current pathological fracture: Secondary | ICD-10-CM

## 2018-01-09 DIAGNOSIS — C9000 Multiple myeloma not having achieved remission: Secondary | ICD-10-CM

## 2018-01-09 DIAGNOSIS — Z9484 Stem cells transplant status: Secondary | ICD-10-CM | POA: Diagnosis not present

## 2018-01-09 DIAGNOSIS — C9001 Multiple myeloma in remission: Secondary | ICD-10-CM

## 2018-01-09 DIAGNOSIS — D6481 Anemia due to antineoplastic chemotherapy: Secondary | ICD-10-CM

## 2018-01-09 DIAGNOSIS — Z5112 Encounter for antineoplastic immunotherapy: Secondary | ICD-10-CM | POA: Insufficient documentation

## 2018-01-09 DIAGNOSIS — M818 Other osteoporosis without current pathological fracture: Secondary | ICD-10-CM

## 2018-01-09 LAB — COMPREHENSIVE METABOLIC PANEL
ALT: 16 U/L (ref 0–44)
AST: 22 U/L (ref 15–41)
Albumin: 3.3 g/dL — ABNORMAL LOW (ref 3.5–5.0)
Alkaline Phosphatase: 67 U/L (ref 38–126)
Anion gap: 11 (ref 5–15)
BILIRUBIN TOTAL: 0.8 mg/dL (ref 0.3–1.2)
BUN: 20 mg/dL (ref 8–23)
CHLORIDE: 103 mmol/L (ref 98–111)
CO2: 24 mmol/L (ref 22–32)
Calcium: 8.7 mg/dL — ABNORMAL LOW (ref 8.9–10.3)
Creatinine, Ser: 0.85 mg/dL (ref 0.44–1.00)
Glucose, Bld: 131 mg/dL — ABNORMAL HIGH (ref 70–99)
POTASSIUM: 4 mmol/L (ref 3.5–5.1)
Sodium: 138 mmol/L (ref 135–145)
TOTAL PROTEIN: 5.7 g/dL — AB (ref 6.5–8.1)

## 2018-01-09 LAB — CBC WITH DIFFERENTIAL (CANCER CENTER ONLY)
Basophils Absolute: 0 10*3/uL (ref 0.0–0.1)
Basophils Relative: 0 %
EOS PCT: 1 %
Eosinophils Absolute: 0.1 10*3/uL (ref 0.0–0.5)
HEMATOCRIT: 30.7 % — AB (ref 34.8–46.6)
Hemoglobin: 10.1 g/dL — ABNORMAL LOW (ref 11.6–15.9)
LYMPHS ABS: 0.9 10*3/uL (ref 0.9–3.3)
LYMPHS PCT: 13 %
MCH: 33.8 pg (ref 25.1–34.0)
MCHC: 32.9 g/dL (ref 31.5–36.0)
MCV: 102.7 fL — AB (ref 79.5–101.0)
MONO ABS: 0.3 10*3/uL (ref 0.1–0.9)
MONOS PCT: 4 %
NEUTROS ABS: 5.9 10*3/uL (ref 1.5–6.5)
Neutrophils Relative %: 82 %
PLATELETS: 190 10*3/uL (ref 145–400)
RBC: 2.99 MIL/uL — ABNORMAL LOW (ref 3.70–5.45)
RDW: 15.7 % — ABNORMAL HIGH (ref 11.2–14.5)
WBC Count: 7.2 10*3/uL (ref 3.9–10.3)

## 2018-01-09 MED ORDER — MONTELUKAST SODIUM 10 MG PO TABS
10.0000 mg | ORAL_TABLET | Freq: Once | ORAL | Status: AC
  Administered 2018-01-09: 10 mg via ORAL

## 2018-01-09 MED ORDER — SODIUM CHLORIDE 0.9 % IV SOLN
Freq: Once | INTRAVENOUS | Status: AC
  Administered 2018-01-09: 11:00:00 via INTRAVENOUS

## 2018-01-09 MED ORDER — METHYLPREDNISOLONE SODIUM SUCC 125 MG IJ SOLR
INTRAMUSCULAR | Status: AC
  Filled 2018-01-09: qty 2

## 2018-01-09 MED ORDER — ACETAMINOPHEN 325 MG PO TABS
650.0000 mg | ORAL_TABLET | Freq: Once | ORAL | Status: AC
  Administered 2018-01-09: 650 mg via ORAL

## 2018-01-09 MED ORDER — DARATUMUMAB CHEMO INJECTION 400 MG/20ML
16.0000 mg/kg | Freq: Once | INTRAVENOUS | Status: AC
  Administered 2018-01-09: 900 mg via INTRAVENOUS
  Filled 2018-01-09: qty 40

## 2018-01-09 MED ORDER — METHYLPREDNISOLONE SODIUM SUCC 125 MG IJ SOLR
125.0000 mg | Freq: Once | INTRAMUSCULAR | Status: AC
  Administered 2018-01-09: 125 mg via INTRAVENOUS

## 2018-01-09 MED ORDER — DIPHENHYDRAMINE HCL 25 MG PO CAPS
ORAL_CAPSULE | ORAL | Status: AC
  Filled 2018-01-09: qty 2

## 2018-01-09 MED ORDER — ACETAMINOPHEN 325 MG PO TABS
ORAL_TABLET | ORAL | Status: AC
  Filled 2018-01-09: qty 2

## 2018-01-09 MED ORDER — MONTELUKAST SODIUM 10 MG PO TABS
ORAL_TABLET | ORAL | Status: AC
  Filled 2018-01-09: qty 1

## 2018-01-09 MED ORDER — DIPHENHYDRAMINE HCL 25 MG PO CAPS
50.0000 mg | ORAL_CAPSULE | Freq: Once | ORAL | Status: AC
  Administered 2018-01-09: 50 mg via ORAL

## 2018-01-09 NOTE — Progress Notes (Signed)
[place] ONCOL[facility] OFFICE PROGRESS NOTE  PCP: [name], MD [place] [id number]  Date of Service:  01/09/2018   DIAGNOSIS: Multiple myeloma with relapse  CHIEF COMPLAINTS: F/u of pneumonia of MM  Oncology History   Multiple myeloma ([place])   Staging form: Multiple Myeloma, AJCC 6th Edition   - Clinical: Stage IIIA - Signed by [name], MD on 09/04/2013      Multiple myeloma ([place])   05/26/2012 Initial Diagnosis    Presenting IgG was 4,040 mg/dL on 06/05/2012 (IgA 35; IgM 34); kappa free light chain 34.4 mg/dL, lambda 0.00, kappa:lambda ratio of 34.75; SPEP with M-spike of 2.60.      06/30/2012 Imaging    Numerous lytic lesions throughout the calvarium.  Lytic lesion within the left lateral scapula.  Findings most compatible with metastases or myeloma. Multiple mid and lower thoracic compression fractures.  Slight compression through the endplates at L3.      54/62/7035 Bone Marrow Biopsy    Bone marrow biopsy showed 49% plasma cell. Normal classical cytogenetics; however, myeloma FISH panel showed 13q- (intermediate risk)          07/14/2012 - 07/27/2012 Radiation Therapy    Palliative radiation 20 Gy over 10 fractions between to thoracic spine cord compression and symptomatic left scapula lytic lesion.      08/10/2012 - 11/2012 Chemotherapy    Started SQ Velcade once weekly, 3 weeks on, 1 weeks off; daily Revlimid d1-21, 7 days off; and Dexamethasone 26m PO weekly.       02/12/2013 Bone Marrow Transplant    Auto bone marrow transplant at [facility]       06/14/2013 Tumor Marker    IgG  1830,  Kappa:lamba ratio, 1.69 (baseline was 34.75 or   M-spike 0.28 (baseline of 2.6 or  89.3% of baseline).        06/25/2013 -  Chemotherapy    Started zometa 3.5 mg monthly       09/01/2013 - 01/2014 Chemotherapy    Maintenance therapy with revlimid 527mdaily for 14 days and then off for 7 (decreased from 21 days on and 7 days off based on  neutropenia). Stopped due to disease progression 01/2017      09/13/2013 - 09/17/2014 Hospital Admission    Hospital admission for pneumonia      12/20/2013 Treatment Plan Change    Maintenance therapy decreased to 2.5 mg daily for 21 days and then off for 7 days based on low counts.       01/25/2014 Tumor Marker    IgG 1360 M spike 0.5      03/01/2014 - 03/05/2014 Hospital [facility], received thrombolytic therapy, cath showed 60% prox RCA, mid-distal RCA 50%, Echo normal, no stents.      04/11/2014 - 08/07/2014 Chemotherapy    Continue Revlimid at 2.5 mg 3 weeks on 1 week off      08/08/2014 - 08/13/2014 Hospital Admission    Hospital admission for pneumonia. Her Revlimid was held.      08/29/2014 - 09/14/2014 Chemotherapy    Maintenance Revlimid restarted      09/14/2014 - 09/17/2014 Hospital Admission    [place]      11/13/2014 - 12/25/2015 Chemotherapy    Maintenance Revlimid restarted, changed to 2.4m34maily, 2 weks on, 1 week off  from 12/15/2014, stopped due to disease progression       01/24/2016 Progression    Patient is M protein has gradually increased to 1.0g, repeated a bone marrow biopsy showed plasma cell 10-30%      02/27/2016 - 03/07/2016 Chemotherapy    Pomalidomide 4 mg daily on day 1-21, dexamethasone 40 mg on day 1, 8 and 15, every 28 days, started on 02/27/2016, daratumumab weekly started on 03/07/2016, held after first dose, due to hospitalization and a severe pancytopenia, pomalidomide was stopped afterwards       03/10/2016 - 03/29/2016 Hospital Admission    Pt was admitted for sepsis from pneumonia, and severe pancytopenia. She required ICU stay for a few days due to hypotension, developed b/l pleural effusion required diuretics and b/l thoracentesis. She also required blood and plt transfusion, and prolonged neupogen  injection for severe neutropenia. She was discharged home after 3 weeks hospital stay       04/26/2016 -  Chemotherapy    Daratumumab restarted on 04/26/2016, Velcade 1.6m/m2 and dexa 233mweekly start on 11/3, velcade held since 06/28/2016 due to CHF       08/26/2016 Echocardiogram    - Left ventricle: LVEF is approximately 35% with diffuse diffuse   hypokinesis with severe hypokinesis/ akinesis of the   inferior/inferoseptal walls. In comparison to echo images from   December 2017, there does not appear to be significant change The   cavity size was normal. Wall thickness was normal. Doppler   parameters are consistent with abnormal left ventricular   relaxation (grade 1 diastolic dysfunction). - Aortic valve: AV is thickened. 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 Present in echo from December 2017 There was   trivial regurgitation. - Right ventricle: Systolic function was mildly reduced. - Right atrium: The atrium was mildly dilated. - Pericardium, extracardiac: A trivial pericardial effusion was   identified.      12/03/2016 Echocardiogram    -Left Ventricle: Systolic function was   mildly to moderately reduced. The estimated ejection fraction was   in the range of 40% to 45%. Diffuse hypokinesis. Doppler   parameters are consistent with abnormal left ventricular   relaxation (grade 1 diastolic dysfunction). Doppler parameters   are consistent with indeterminate ventricular filling pressure. - Left atrium: The atrium was severely dilated. - Tricuspid valve: There was trivial regurgitation. - Pulmonary arteries: Systolic pressure was within the normal   range. PA peak pressure: 33 mm Hg (S). - Global longitudinal strain -10.5% (abnormal)      03/12/2017 Echocardiogram    ECHO 03/12/17 Study Conclusions - Left ventricle: The cavity size was normal. Wall thickness was   increased in a pattern of mild LVH.  Systolic function was normal.   The estimated ejection fraction was in the range of 50% to 55%.   There is hypokinesis of the basalinferior myocardium. Doppler   parameters are consistent with abnormal left ventricular   relaxation (grade 1 diastolic dysfunction). - Aortic valve: There was trivial regurgitation. - Mitral valve: Calcified annulus. Mildly thickened leaflets .   There was trivial regurgitation. - Tricuspid valve: There was mild regurgitation. Impressions: - There has been mild improvement in EF since prior study.       12/12/2017 - 12/15/2017 Hospital Admission    Admission diagnosis: Pnuemonia and acute hypoxic respiratory failure Additional comments: treated for pneumoania and acute hypoxic respiratory failure before she was discharged on 12/15/17 with oral anitbiotics. Her  last treatment was 11/21/17 and heald since admission.       12/30/2017 - 01/02/2018 Hospital Admission    Admission diagnosis: Pnuemonia  Additional comments: She was again hospitalized on 12/30/17 as her penumonia did not resolve. I previously suggested ID concult and workup to rule out opportunistic infections as she is immunocompromised. She was discharged on 01/02/18.          CURRENT TREATMENT:   1. Pomalidomide 4 mg daily on day 1-21, dexamethasone 40on day 1, 8 and 15, every 28 days, started on 02/27/2016, daratumumab weekly started on 03/07/2016, treatment on hold since 03/09/2016 due to hospitalization and severe cytopenia 2. Daratumumab restarted on 04/26/2016, Velcade 1.37m/m2 and dexa 224mweekly start on 11/3, velcade held since 06/28/2016 due to CHF. DaTonette Biharias changed from every 4 weeks to every 2 weeks on 03/28/2017 per Dr. GaKendell Baneecommendation. Held after 11/21/17 treatments due to recurrent pnuemonia, restarted 01/09/18 at every 4 weeks.    INTERVAL HISTORY:   ClNARCISSA MELDER89.o. female here for a follow up post hospitalization for recurrent pnuemonia. She was last discharged on  01/02/18.   She presents to the clinic today by herself. She notes she is still recovering but feeling much better overall. She notes she finished her antibiotics yesterday. She still has a cough but with more clear sputum. She plans to go out of town the 3rd week of July. She notes she is not in the sun often unless gardening but has gotten itchiness of the skin recently.      MEDICAL HISTORY: Past Medical History:  Diagnosis Date  . CAD (coronary artery disease)    [place]  pRCA 60%, mid to dist RCA 50% >>> med rx  . [place]   MRI- diffuse myeloma involvement of T-L spine  . History of radiation therapy 07/13/12-07/27/12   spinal cord compression T3-T10,left scapula  . Hx of echocardiogram    [facility]  Normal LVEF without significant regional wall motion abnormalities. Mild   . [place]  . OSTEOPOROSIS 06/11/2010   Multiple compression fractures; and spontaneous fracture of sternum Qualifier: Diagnosis of  By: SaZebedee IbaP, SuManuela Schwartz   . Thoracic kyphosis 07/13/12   per MRI scan  . Unspecified deficiency anemia    ALLERGIES:  is allergic to zithromax [azithromycin]; zosyn [piperacillin sod-tazobactam so]; and quinolones.  SURGICAL HISTORY:  Past Surgical History:  Procedure Laterality Date  . APPENDECTOMY    . CESAREAN SECTION     x2   . COLONOSCOPY  2007   neg with Dr. MaWatt Climes. ELBOW SURGERY    . HIP SURGERY  2009   left  . TUBAL LIGATION     REVIEW OF SYSTEMS:   Constitutional: Denies abnormal weight loss (+) lower energy Eyes: Denies blurriness of vision  Ears, nose, mouth, throat, and face: Denies mucositis   Respiratory: Denies dyspnea or wheezes (+) productive cough  Cardiovascular: Denies palpitation, chest discomfort  Gastrointestinal:  Denies heartburn   Skin: Denies abnormal skin rashes (+)  diffuse skin itching Lymphatics: Denies new lymphadenopathy or easy bruising Neurological:Denies new weaknesses. (+) Positive for paraesthesias and numbness to the feet (baseline). Stable.  Behavioral/Psych: Mood is stable, no new changes  All other systems were reviewed with the patient and are negative.   PHYSICAL EXAMINATION:   ECOG PERFORMANCE STATUS: 1 BP  117/76 (BP Location: Right Arm, Patient Position: Sitting)   Pulse 86   Temp 98.9 F (37.2 C) (Oral)   Resp 16   Ht 5' 6.5" (1.689 m)   Wt 116 lb 14.4 oz (53 kg)   SpO2 98%   BMI 18.59 kg/m   GENERAL:alert, no distress and comfortable; easily mobile to exam table; kyphosis, moderate.  SKIN: skin color, texture, turgor are normal, no rashes or significant lesions except a small healing, dry wound in the right lower leg above his ankle, with surrounding skin erythema and swelling EYES: normal, Conjunctiva are pink, 1 tear/injection blood vessel left eye in sclera. OROPHARYNX:no exudate, no erythema and lips, buccal mucosa, and tongue normal, no ONJ NECK: supple, thyroid normal size, non-tender, without nodularity; nodules cervical bilaterally.  LYMPH:  no palpable lymphadenopathy in the cervical, axillary or supraclavicular LUNGS: (+) crackles in right lower lung, otherwise negative.   HEART: regular rate & rhythm and no murmurs  ABDOMEN:abdomen soft, and normal bowel sounds  Musculoskeletal:no cyanosis of digits and no clubbing, NEURO: alert & oriented x 3 with fluent speech, no focal motor/sensory deficits   Labs:  CBC Latest Ref Rng & Units 01/09/2018 01/02/2018 01/01/2018  WBC 3.9 - 10.3 K/uL 7.2 6.0 4.7  Hemoglobin 11.6 - 15.9 g/dL 10.1(L) 9.0(L) 9.1(L)  Hematocrit 34.8 - 46.6 % 30.7(L) 26.3(L) 27.7(L)  Platelets 145 - 400 K/uL 190 181 177    CMP Latest Ref Rng & Units 01/02/2018 01/01/2018 12/30/2017  Glucose 65 - 99 mg/dL 92 116(H) 126(H)  BUN 6 - 20 mg/dL 16 15 17   Creatinine 0.44 - 1.00 mg/dL 0.74 0.63 1.01(H)  Sodium  135 - 145 mmol/L 141 137 138  Potassium 3.5 - 5.1 mmol/L 3.8 3.9 4.3  Chloride 101 - 111 mmol/L 112(H) 107 103  CO2 22 - 32 mmol/L 23 25 26   Calcium 8.9 - 10.3 mg/dL 8.5(L) 8.3(L) 8.9  Total Protein 6.5 - 8.1 g/dL - - 6.4(L)  Total Bilirubin 0.3 - 1.2 mg/dL - - 0.7  Alkaline Phos 38 - 126 U/L - - 65  AST 15 - 41 U/L - - 19  ALT 14 - 54 U/L - - 16    SPEP M-PROTEIN 10/01/2013: 0.18 (nadir)  10/13/2014: 0.6 11/21/2014: 0.5 01/30/2015: 0.6 03/13/2015: 0.6 04/20/2015: 0.5 06/01/2015: 0.5 07/20/2015: 0.7 09/14/2015: 0.7 10/26/2015: 0.8 12/07/2015: 1.0 02/07/2016: 1.1  04/03/2016: 0.2 05/03/2016: 0.2  05/31/2016: 0.1 06/28/2016: 0.1 07/25/2016: 0.1 08/21/2016: 0.1 09/27/2016: 0.1 10/23/16: 0.1 12/27/2016: 0.1 (Dara Specific IFE was still positive) 01/24/17: 0.1 02/28/17: 0.1 03/28/17: 0.1 04/25/17: 0.1 05/23/17: 0.1 06/27/17: 0.1 08/01/17: 0.1 2:15/19: 0.1 09/26/17: 0.2 10/24/17: 0.1 11/21/17: 0.2 01/09/18: PENDING    IgG ([phone]) 10/13/2014: 1710 11/21/2014: 1460 01/30/2015: 1380 03/13/15: 1570 04/20/2015: 1550 06/01/2015: 1590 07/22/2015: 1770 09/14/2015: 1787 10/26/2015: 1898 12/07/2015: 2045 02/07/2016: 1883 04/03/2016: 787 05/03/2016: 685  05/31/2016: 544 06/28/2016: 463 07/25/2016: 455 08/21/2016: 426 09/27/2016: 438 10/23/16: 374 12/06/16: 370 12/27/16: 343, 346 01/24/17: 332  02/28/17: 362 03/28/17:  436 04/25/17: 431 05/23/17: 471 06/27/17: 429 08/01/17: 392 08/29/17: 409 09/26/17: 421 10/24/17: 438 11/21/17: 465 01/09/18: PENDING   Serum kappa, lambda light chains, and ratio  11/21/2014: 3.95, 2.08, 1.90  01/30/2015: 3.92, 2.64, 1.48 03/13/2015: 3.55, 2.17, 1.64 04/20/15: 4.52, 1.78, 2.54 06/01/2015: 4.82, 1.75, 2.75 07/20/2015: 8.71, 2.43, 3.58 09/14/2015: 9.17, 2.02, 4.55 10/26/2015: 11.4, 1.75, 6.42 12/07/2015: 1.54, 1.83, 8.44 02/07/2016: 1.93, 1.42, 13.6 04/03/2016: 0.96, 0.95, 1.01  05/03/2016: 0.71, 0.34, 2.09  05/31/2016: 0.48, 0.26, 1.85 06/28/2016: 0.52, 0.60, 0.87 07/25/2016:  0.46, 0.27, 1.70 08/21/2016: 0.43, 0.27, 1.59 09/27/2016: 0.52, 0.24, 2.17 10/23/16: .0.49, 0.35, 1.40 12/06/16: 0.68, 0.29, 2.34 12/27/16: 0.54, 0.29, 1.86 02/28/17: 0.56, 0.30, 1.87 04/11/17: 0.61, 0.29, 2.10 05/09/17: 0.57, 0.33, 1.73 06/13/17: 0.65, 0.23, 2.83 07/18/17: 0.75 0.26, 2.83 08/15/17: 0.78, 0.27, 2.89 08/29/17: 0.79, 0.3, 2.63 09/26/17: 0.88, 0.26, 3.38  10/24/17: 0.95, 0.36, 2.64 11/21/17: 0.99, 0.24, 4.13 01/09/18: PENDING    24 hr urine PEP (total protein, M-spike) 10/13/2014: (-) 02/12/2016: 22m, 632m9/22/2017: 114 mg, (-) 08/08/16: 99, (-) 12/27/16: 5928m(-) 03/28/17: 54m72m-)   PATHOLOGY REPORT  Diagnosis 01/24/2016 Bone Marrow, Aspirate,Biopsy, and Clot, RT iliac and core - VARIABLY CELLULAR BONE MARROW WITH PLASMA CELL NEOPLASM. - SEE COMMENT. PERIPHERAL BLOOD: - MACROCYTIC ANEMIA.  Diagnosis Note The bone marrow is variably cellular with trilineage hematopoiesis and non specific myeloid changes. In this background, the plasma cell component is variable ranging from less than 10 % in many areas to 30% in some aspirate particles with relatively low cellularity. Foci of interstitial infiltrates and small clusters are also seen in the clot section. Immunohistochemical stains show kappa light chain restriction in atypical plasma cell clusters and infiltrates consistent with plasma cell neoplasm. Correlation with cytogenetic and FISH studies is recommended. (BNS:gt, 01/1306-21-17iagnosis 03/28/16 PLEURAL FLUID, LEFT (SPECIMEN 1 OF 1 COLLECTED 03/28/16): REACTIVE MESOTHELIAL CELLS  RADIOGRAPHIC STUDIES:  ECHO 03/12/17 Study Conclusions - Left ventricle: The cavity size was normal. Wall thickness was   increased in a pattern of mild LVH. Systolic function was normal.   The estimated ejection fraction was in the range of 50% to 55%.   There is hypokinesis of the basalinferior myocardium. Doppler   parameters are consistent with abnormal left ventricular   relaxation  (grade 1 diastolic dysfunction). - Aortic valve: There was trivial regurgitation. - Mitral valve: Calcified annulus. Mildly thickened leaflets .   There was trivial regurgitation. - Tricuspid valve: There was mild regurgitation. Impressions: - There has been mild improvement in EF since prior study.    Echo 12/03/16 -Left Ventricle: Systolic function was   mildly to moderately reduced. The estimated ejection fraction was   in the range of 40% to 45%. Diffuse hypokinesis. Doppler  parameters are consistent with abnormal left ventricular relaxation (grade 1 diastolic dysfunction). Doppler parameters are consistent with indeterminate ventricular filling pressure. - Left atrium: The atrium was severely dilated. - Tricuspid valve: There was trivial regurgitation. - Pulmonary arteries: Systolic pressure was within the normal   range. PA peak pressure: 33 mm Hg (S). - Global longitudinal strain -10.5% (abnormal)  Echo 08/26/2016 - Left ventricle: LVEF is approximately 35% with diffuse hypokinesis with severe hypokinesis/ akinesis of the inferior/inferoseptal walls. In comparison to echo images from December 2017, there does not appear to be significant change The cavity size was normal. Wall thickness was normal. Doppler parameters are consistent with abnormal left ventricular relaxation (grade 1 diastolic dysfunction). - Aortic valve: AV is thickened. 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 Present in echo from December 2017 There was trivial regurgitation. - Right ventricle: Systolic function was mildly reduced. - Right atrium: The atrium was mildly dilated. - Pericardium, extracardiac: A trivial pericardial effusion was identified.  ASSESSMENT: Shannon [id number]. female with a history of Multiple Myeloma s/p autotransplant in August 2014 followed by maintenance low dose Revlimid therapy, and relapsed in 01/2016   1. IgG kappa  Multiple myeloma s/p Auto SCT, intermediate risk, relapsed in 01/2016 --  MM markers demonstrate a very good initial response (M protein baseline 2.6, down to 0.18 after induction chemo and transplant, stable around 0.5 since July 2015, but has gradually increased lately),  she unfortunately relapsed in July 2017 -I previously discussed her repeated bone marrow biopsy from 02/01/2016, which showed 10-30% plasma cells in her marrow -I agreed with Dr. Kendell Bane recommendation to stop Revlimid and change to Daratumumab, Pomalidomide and dexamethasone due to her disease relapse. I don't think she has resistance to Revlimid, she has been on very small dose of Revlimid due to her prior history of multiple infections. -She started first cycle DaraPD in 02/2016, but 2 weeks later she developed severe pancytopenia, pneumonia with sepsis required 3 weeks hospital stay. She did have a very nice response to the treatment, her M-protein has significantly reduced from 1.2 to 0.2, light chain levels normalized even with half cycle treatment  -I previously recommended her to change regiment to DaraVD, and started her on dara every 2 weeks for 2 doses, she tolerated this very well -she has had good response to treatment, M-protein significantly dropped -Her previous echo in December 2017 showed a significant drop on EF, she is not much symptomatically for CHF, due to the potential contribution of Velcade to her CHF, I'll hold her Velcade for now. Her repeated echo in May 2018 showed a significant improvement, EF 40-45%. -her dara-specific IFE in 12/2016 was still positive, she has low level M-protein, she has achieved a good partial response. -She hasn't had Velcade since December 2017. Will continue holding it for now due to her CHF -We previously discussed her ECHO from 02/2017 and 06/2017 that showed her heart function has normalized to EF of 50% to 55% and 55-60% respectively.  -She has seen Dr. [name] at [facility] who  suggests she use a combination treatment of Dara + low dose Pomalidomide with and increase treatment to every 2 weeks.  -Due to her previous severe cytopenia and infection, and a prolonged excellent response to single agent Dara, I did not recommend her to change to DaraPD again. We decided to change Dara/dexa treatment to every 2 weeks on 03/28/17, she has been tolerating well overall. Her M-protein level has been stable (0.1-0.2) since then  -If she has disease progression, I would consider adding Velcade back -Treatment was previously held after 11/21/17 due to hospitalization from recurrent pneumonia twice. She is recovering well and labs adequate to proceed with Dara today. I discussed we will change her dura to every 4 weeks due to her recurrent infection  -she is scheduled to go to [facility] in [place] in 3-4 weeks   -I discussed not giving treatment prior to her traveling as she is prone to Pnuemonia afterward.  -she has had dry skin, I suggest she wear long sleeve and pants with hats when out in the sun as medication can make her sensitive to the sun.  -F/u in 2 weeks    2. Recurrent pneumonia  -Presented after her cruise with productive cough with white-yellowish phlegm, congestion, upper abdominal soreness, diarrhea/watery stool, , nausea, sore throat, no appetite, weight loss, fever and headaches. -Previous exam showed bilateral rales and rhonchi. She desatted to 85% on room air when she walks.  I highly suspicious that this is pneumonia, possible bilateral. -She previously had multiple episodes of pneumonia, which required ICU stay and prolonged hospital stay. She is on steroids in the daratumumab, immunocompromised.  -I recommend ED visit and Likely hospitalization. She agreed and was admitted  on 12/12/17 and was treated for pneumonia and acute hypoxic respiratory failure before she was discharged on 12/15/17 with oral antibiotics.  -She was again hospitalized on 12/30/17 as her pneumonia did not  resolve. I previously suggested ID consult and workup to rule out opportunistic infections as she is immunocompromised. She was discharged on 01/02/18. Her ID workup was negative. -She completed her antibiotics yesterday and is recovering well. Still has productive cough. Will monitor.     3. Pancytopenia  -secondary to Dara, slightly worse after we increased Dara to every 2 weeks  -Continue monitoring closely. -Blood counts WNL except continued mild anemia with Hg at 10.1 (01/09/18)  4. CHF, CAD, inferior STEMI in 02/2014 -continue metoprolol and lipitor, Dr. Meda Coffee previously started her on lisinopril also -Dr. Meda Coffee plans to repeat her echo every 3 months when she is on treatment.  -I previously encouraged her to watch for leg swelling and SOB and chest pain as these are symptoms of her heart condition. -Her 06/2017 ECHO shows improved heart function with EF of 55-60%.   5. Osteoporosis -previous bone density scan showed worsening osteoporosis, continue Zometa, changed to every 3 months after 03/2015.  -she declined repeating DEXA  -she was on Zometa for 4 years, the risk probably outweighs the benefit so we stopped it, last injection was 10/23/16  6. Peripheral neuropathy in feet, G1-2 -Secondary to previous chemotherapy  -We previously discussed the role of Neurontin, she declined. -I previously suggested Vitamin B complex and continue practice of circulation of her feet. I advised her to be careful to not fall.  -Will monitor, she will let us know if this worsens.    7. Goal of care discussion  -We previously discussed the incurable nature of her cancer, and the overall poor prognosis, especially if she does not have good response to chemotherapy or progress on chemo -The patient understands the goal of care is palliative. -she is full code now    Plan -Labs adequate to proceed with Tonette Bihari today, will not given dexa with this cycle  -Lab and f/u on 7/11 -Lab, f/u and daratumumab  on 7/31 after she returns from her trip to [place]    All questions were answered. The patient knows to call the clinic with any problems, questions or concerns. We can certainly see the patient much sooner if necessary.  I spent 20 minutes counseling the patient face to face. The total time spent in the appointment was 25 minutes.  Oneal Deputy, am acting as scribe for [name], MD.   I have reviewed the above documentation for accuracy and completeness, and I agree with the above.       [name]  01/09/2018

## 2018-01-09 NOTE — Telephone Encounter (Signed)
Appointments scheduled Letter/calendar mailed to patient per 6/28 los

## 2018-01-09 NOTE — Progress Notes (Signed)
Patient brought in old medication gabapentin and tramadol.  Taken to our pharmacy for disposal.

## 2018-01-10 ENCOUNTER — Encounter: Payer: Self-pay | Admitting: Hematology

## 2018-01-12 LAB — KAPPA/LAMBDA LIGHT CHAINS
Kappa free light chain: 17.9 mg/L (ref 3.3–19.4)
Kappa, lambda light chain ratio: 1.77 — ABNORMAL HIGH (ref 0.26–1.65)
Lambda free light chains: 10.1 mg/L (ref 5.7–26.3)

## 2018-01-12 LAB — IFE, DARA-SPECIFIC, SERUM
IGG (IMMUNOGLOBIN G), SERUM: 756 mg/dL (ref 700–1600)
IgA: 9 mg/dL — ABNORMAL LOW (ref 64–422)
IgM (Immunoglobulin M), Srm: 46 mg/dL (ref 26–217)

## 2018-01-13 LAB — PROTEIN ELECTROPHORESIS, SERUM
A/G Ratio: 1.5 (ref 0.7–1.7)
ALBUMIN ELP: 3.3 g/dL (ref 2.9–4.4)
ALPHA-1-GLOBULIN: 0.3 g/dL (ref 0.0–0.4)
ALPHA-2-GLOBULIN: 0.6 g/dL (ref 0.4–1.0)
BETA GLOBULIN: 0.7 g/dL (ref 0.7–1.3)
Gamma Globulin: 0.6 g/dL (ref 0.4–1.8)
Globulin, Total: 2.2 g/dL (ref 2.2–3.9)
M-Spike, %: 0.2 g/dL — ABNORMAL HIGH
Total Protein ELP: 5.5 g/dL — ABNORMAL LOW (ref 6.0–8.5)

## 2018-01-21 ENCOUNTER — Other Ambulatory Visit: Payer: Self-pay

## 2018-01-21 ENCOUNTER — Ambulatory Visit (INDEPENDENT_AMBULATORY_CARE_PROVIDER_SITE_OTHER): Payer: Medicare Other | Admitting: Family Medicine

## 2018-01-21 DIAGNOSIS — J189 Pneumonia, unspecified organism: Secondary | ICD-10-CM | POA: Diagnosis not present

## 2018-01-21 NOTE — Patient Instructions (Signed)
I think you likely are just unlucky with the back to back pneumonia.  I don't see any additional testing or treatments needed right now. I would change my mind if you get another pneumonia.   You have had both pneumonia vaccines.   We typically get flu shots in mid sept.  Call and get one as soon as they come in.

## 2018-01-22 ENCOUNTER — Encounter: Payer: Self-pay | Admitting: Hematology

## 2018-01-22 ENCOUNTER — Inpatient Hospital Stay (HOSPITAL_BASED_OUTPATIENT_CLINIC_OR_DEPARTMENT_OTHER): Payer: Medicare Other | Admitting: Hematology

## 2018-01-22 ENCOUNTER — Encounter: Payer: Self-pay | Admitting: Family Medicine

## 2018-01-22 ENCOUNTER — Inpatient Hospital Stay: Payer: Medicare Other | Attending: Hematology

## 2018-01-22 VITALS — BP 116/78 | HR 90 | Temp 98.0°F | Resp 20 | Ht 66.5 in | Wt 114.5 lb

## 2018-01-22 DIAGNOSIS — I2510 Atherosclerotic heart disease of native coronary artery without angina pectoris: Secondary | ICD-10-CM | POA: Insufficient documentation

## 2018-01-22 DIAGNOSIS — Z9481 Bone marrow transplant status: Secondary | ICD-10-CM

## 2018-01-22 DIAGNOSIS — Z923 Personal history of irradiation: Secondary | ICD-10-CM | POA: Insufficient documentation

## 2018-01-22 DIAGNOSIS — M810 Age-related osteoporosis without current pathological fracture: Secondary | ICD-10-CM | POA: Diagnosis not present

## 2018-01-22 DIAGNOSIS — Z79899 Other long term (current) drug therapy: Secondary | ICD-10-CM

## 2018-01-22 DIAGNOSIS — Z5112 Encounter for antineoplastic immunotherapy: Secondary | ICD-10-CM | POA: Insufficient documentation

## 2018-01-22 DIAGNOSIS — I509 Heart failure, unspecified: Secondary | ICD-10-CM | POA: Insufficient documentation

## 2018-01-22 DIAGNOSIS — D61818 Other pancytopenia: Secondary | ICD-10-CM | POA: Insufficient documentation

## 2018-01-22 DIAGNOSIS — Z9221 Personal history of antineoplastic chemotherapy: Secondary | ICD-10-CM

## 2018-01-22 DIAGNOSIS — I252 Old myocardial infarction: Secondary | ICD-10-CM | POA: Diagnosis not present

## 2018-01-22 DIAGNOSIS — G629 Polyneuropathy, unspecified: Secondary | ICD-10-CM | POA: Diagnosis not present

## 2018-01-22 DIAGNOSIS — R05 Cough: Secondary | ICD-10-CM | POA: Diagnosis not present

## 2018-01-22 DIAGNOSIS — Z8701 Personal history of pneumonia (recurrent): Secondary | ICD-10-CM | POA: Insufficient documentation

## 2018-01-22 DIAGNOSIS — C9002 Multiple myeloma in relapse: Secondary | ICD-10-CM | POA: Diagnosis not present

## 2018-01-22 DIAGNOSIS — M818 Other osteoporosis without current pathological fracture: Secondary | ICD-10-CM

## 2018-01-22 DIAGNOSIS — C9001 Multiple myeloma in remission: Secondary | ICD-10-CM

## 2018-01-22 LAB — CBC WITH DIFFERENTIAL (CANCER CENTER ONLY)
BASOS ABS: 0 10*3/uL (ref 0.0–0.1)
BASOS PCT: 1 %
Eosinophils Absolute: 0.2 10*3/uL (ref 0.0–0.5)
Eosinophils Relative: 6 %
HEMATOCRIT: 29.7 % — AB (ref 34.8–46.6)
HEMOGLOBIN: 9.9 g/dL — AB (ref 11.6–15.9)
Lymphocytes Relative: 17 %
Lymphs Abs: 0.6 10*3/uL — ABNORMAL LOW (ref 0.9–3.3)
MCH: 34 pg (ref 25.1–34.0)
MCHC: 33.3 g/dL (ref 31.5–36.0)
MCV: 102.1 fL — ABNORMAL HIGH (ref 79.5–101.0)
Monocytes Absolute: 0.6 10*3/uL (ref 0.1–0.9)
Monocytes Relative: 17 %
NEUTROS ABS: 1.9 10*3/uL (ref 1.5–6.5)
NEUTROS PCT: 59 %
Platelet Count: 214 10*3/uL (ref 145–400)
RBC: 2.91 MIL/uL — ABNORMAL LOW (ref 3.70–5.45)
RDW: 15.9 % — AB (ref 11.2–14.5)
WBC Count: 3.3 10*3/uL — ABNORMAL LOW (ref 3.9–10.3)

## 2018-01-22 LAB — COMPREHENSIVE METABOLIC PANEL
ALBUMIN: 3.3 g/dL — AB (ref 3.5–5.0)
ALT: 13 U/L (ref 0–44)
AST: 17 U/L (ref 15–41)
Alkaline Phosphatase: 65 U/L (ref 38–126)
Anion gap: 7 (ref 5–15)
BILIRUBIN TOTAL: 0.6 mg/dL (ref 0.3–1.2)
BUN: 16 mg/dL (ref 8–23)
CO2: 27 mmol/L (ref 22–32)
Calcium: 9.5 mg/dL (ref 8.9–10.3)
Chloride: 104 mmol/L (ref 98–111)
Creatinine, Ser: 0.93 mg/dL (ref 0.44–1.00)
GFR calc Af Amer: >60 mL/min (ref >60)
GFR calc non Af Amer: 59 mL/min — ABNORMAL LOW (ref >60)
GLUCOSE: 97 mg/dL (ref 70–99)
POTASSIUM: 4 mmol/L (ref 3.5–5.1)
Sodium: 138 mmol/L (ref 135–145)
TOTAL PROTEIN: 6.2 g/dL — AB (ref 6.5–8.1)

## 2018-01-22 NOTE — Assessment & Plan Note (Signed)
Seems to be resolving uneventfully.  I doubt she needs further WU.  If we do decide to proceed with further investigation, my first step would be a modified barium swallow to look for silent aspiration.

## 2018-01-22 NOTE — Progress Notes (Signed)
[name] OFFICE PROGRESS NOTE  PCP: [name], MD [place] [address]  Date of Service:  01/22/2018   DIAGNOSIS: Multiple myeloma with relapse  CHIEF COMPLAINTS: F/u of pneumonia of MM  Oncology History   Multiple myeloma ([place])   Staging form: Multiple Myeloma, AJCC 6th Edition   - Clinical: Stage IIIA - Signed by [name], MD on 09/04/2013      Multiple myeloma ([place])   05/26/2012 Initial Diagnosis    Presenting IgG was 4,040 mg/dL on 06/05/2012 (IgA 35; IgM 34); kappa free light chain 34.4 mg/dL, lambda 0.00, kappa:lambda ratio of 34.75; SPEP with M-spike of 2.60.      06/30/2012 Imaging    Numerous lytic lesions throughout the calvarium.  Lytic lesion within the left lateral scapula.  Findings most compatible with metastases or myeloma. Multiple mid and lower thoracic compression fractures.  Slight compression through the endplates at L3.      14/43/1540 Bone Marrow Biopsy    Bone marrow biopsy showed 49% plasma cell. Normal classical cytogenetics; however, myeloma FISH panel showed 13q- (intermediate risk)          07/14/2012 - 07/27/2012 Radiation Therapy    Palliative radiation 20 Gy over 10 fractions between to thoracic spine cord compression and symptomatic left scapula lytic lesion.      08/10/2012 - 11/2012 Chemotherapy    Started SQ Velcade once weekly, 3 weeks on, 1 weeks off; daily Revlimid d1-21, 7 days off; and Dexamethasone 40m PO weekly.       02/12/2013 Bone Marrow Transplant    [facility]       06/14/2013 Tumor Marker    IgG  1830,  Kappa:lamba ratio, 1.69 (baseline was 34.75 or   M-spike 0.28 (baseline of 2.6 or  89.3% of baseline).        06/25/2013 -  Chemotherapy    Started zometa 3.5 mg monthly       09/01/2013 - 01/2014 Chemotherapy    Maintenance therapy with revlimid 548mdaily for 14 days and then off for 7 (decreased from 21 days on and 7 days off based on  neutropenia). Stopped due to disease progression 01/2017      09/13/2013 - 09/17/2014 Hospital Admission    Hospital admission for pneumonia      12/20/2013 Treatment Plan Change    Maintenance therapy decreased to 2.5 mg daily for 21 days and then off for 7 days based on low counts.       01/25/2014 Tumor Marker    IgG 1360 M spike 0.5      03/01/2014 - 03/05/2014 [facility] received thrombolytic therapy, cath showed 60% prox RCA, mid-distal RCA 50%, Echo normal, no stents.      04/11/2014 - 08/07/2014 Chemotherapy    Continue Revlimid at 2.5 mg 3 weeks on 1 week off      08/08/2014 - 08/13/2014 Hospital Admission    Hospital admission for pneumonia. Her Revlimid was held.      08/29/2014 - 09/14/2014 Chemotherapy    Maintenance Revlimid restarted      09/14/2014 - 09/17/2014 Hospital Admission    [place]      11/13/2014 - 12/25/2015 Chemotherapy    Maintenance Revlimid restarted, changed to 2.68m44maily, 2 weks on, 1 week off  from 12/15/2014, stopped due to disease progression       01/24/2016 Progression    Patient is M protein has gradually increased to 1.0g, repeated a bone marrow biopsy showed plasma cell 10-30%      02/27/2016 - 03/07/2016 Chemotherapy    Pomalidomide 4 mg daily on day 1-21, dexamethasone 40 mg on day 1, 8 and 15, every 28 days, started on 02/27/2016, daratumumab weekly started on 03/07/2016, held after first dose, due to hospitalization and a severe pancytopenia, pomalidomide was stopped afterwards       03/10/2016 - 03/29/2016 Hospital Admission    Pt was admitted for sepsis from pneumonia, and severe pancytopenia. She required ICU stay for a few days due to hypotension, developed b/l pleural effusion required diuretics and b/l thoracentesis. She also required blood and plt transfusion, and prolonged neupogen  injection for severe neutropenia. She was discharged home after 3 weeks hospital stay       04/26/2016 -  Chemotherapy    Daratumumab restarted on 04/26/2016, Velcade 1.48m/m2 and dexa 240mweekly start on 11/3, velcade held since 06/28/2016 due to CHF       08/26/2016 Echocardiogram    - Left ventricle: LVEF is approximately 35% with diffuse diffuse   hypokinesis with severe hypokinesis/ akinesis of the   inferior/inferoseptal walls. In comparison to echo images from   December 2017, there does not appear to be significant change The   cavity size was normal. Wall thickness was normal. Doppler   parameters are consistent with abnormal left ventricular   relaxation (grade 1 diastolic dysfunction). - Aortic valve: AV is thickened. 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 Present in echo from December 2017 There was   trivial regurgitation. - Right ventricle: Systolic function was mildly reduced. - Right atrium: The atrium was mildly dilated. - Pericardium, extracardiac: A trivial pericardial effusion was   identified.      12/03/2016 Echocardiogram    -Left Ventricle: Systolic function was   mildly to moderately reduced. The estimated ejection fraction was   in the range of 40% to 45%. Diffuse hypokinesis. Doppler   parameters are consistent with abnormal left ventricular   relaxation (grade 1 diastolic dysfunction). Doppler parameters   are consistent with indeterminate ventricular filling pressure. - Left atrium: The atrium was severely dilated. - Tricuspid valve: There was trivial regurgitation. - Pulmonary arteries: Systolic pressure was within the normal   range. PA peak pressure: 33 mm Hg (S). - Global longitudinal strain -10.5% (abnormal)      03/12/2017 Echocardiogram    ECHO 03/12/17 Study Conclusions - Left ventricle: The cavity size was normal. Wall thickness was   increased in a pattern of mild LVH.  Systolic function was normal.   The estimated ejection fraction was in the range of 50% to 55%.   There is hypokinesis of the basalinferior myocardium. Doppler   parameters are consistent with abnormal left ventricular   relaxation (grade 1 diastolic dysfunction). - Aortic valve: There was trivial regurgitation. - Mitral valve: Calcified annulus. Mildly thickened leaflets .   There was trivial regurgitation. - Tricuspid valve: There was mild regurgitation. Impressions: - There has been mild improvement in EF since prior study.       12/12/2017 - 12/15/2017 Hospital Admission    Admission diagnosis: Pnuemonia and acute hypoxic respiratory failure Additional comments: treated for pneumoania and acute hypoxic respiratory failure before she was discharged on 12/15/17 with oral anitbiotics. She  did not require home oxygen.       12/30/2017 - 01/02/2018 Hospital Admission    Admission diagnosis: Pnuemonia  Additional comments: She was again hospitalized on 12/30/17 for pneumonia, ID work up was otherwise negative, she was discharged home with oral antibiotics.         CURRENT TREATMENT:   1. Pomalidomide 4 mg daily on day 1-21, dexamethasone 40on day 1, 8 and 15, every 28 days, started on 02/27/2016, daratumumab weekly started on 03/07/2016, treatment on hold since 03/09/2016 due to hospitalization and severe cytopenia 2. Daratumumab restarted on 04/26/2016, Velcade 1.34m/m2 and dexa 225mweekly start on 11/3, velcade held since 06/28/2016 due to CHF. DaTonette Biharias changed from every 4 weeks to every 2 weeks on 03/28/2017 per Dr. GaKendell Baneecommendation. Held after 11/21/17 treatment due to recurrent pnuemonia, restarted 01/09/18 at every 4 weeks.    INTERVAL HISTORY:   ClJORDANNE ELSBURY89.o. female here for a follow up. She presents to the clinic today by herself. She notes she feels better overall and notes she is ready to go on her trip. She will not be back until 6 weeks after her last treatment.   Her cough is currently very mild with whitish phlegm occasionally. She is back to being active, she biked here to her visit. Her treatment 2 weeks ago was tolerated well. She recently saw her PCP. She understands she has to be careful while on her trip.      MEDICAL HISTORY: Past Medical History:  Diagnosis Date  . CAD (coronary artery disease)    [place]  pRCA 60%, mid to dist RCA 50% >>> med rx  . [place]   MRI- diffuse myeloma involvement of T-L spine  . History of radiation therapy 07/13/12-07/27/12   spinal cord compression T3-T10,left scapula  . Hx of echocardiogram    [facility]  Normal LVEF without significant regional wall motion abnormalities. Mild   . [place]  . OSTEOPOROSIS 06/11/2010   Multiple compression fractures; and spontaneous fracture of sternum Qualifier: Diagnosis of  By: SaZebedee IbaP, SuManuela Schwartz   . Thoracic kyphosis 07/13/12   per MRI scan  . Unspecified deficiency anemia    ALLERGIES:  is allergic to zithromax [azithromycin]; zosyn [piperacillin sod-tazobactam so]; and quinolones.  SURGICAL HISTORY:  Past Surgical History:  Procedure Laterality Date  . APPENDECTOMY    . CESAREAN SECTION     x2   . COLONOSCOPY  2007   neg with Dr. MaWatt Climes. ELBOW SURGERY    . HIP SURGERY  2009   left  . TUBAL LIGATION     REVIEW OF SYSTEMS:   Constitutional: Denies abnormal weight loss (+) improved energy Eyes: Denies blurriness of vision  Ears, nose, mouth, throat, and face: Denies mucositis   Respiratory: Denies dyspnea or wheezes (+) productive cough, mild Cardiovascular: Denies palpitation, chest discomfort  Gastrointestinal:  Denies heartburn   Skin: Denies abnormal skin rashes  Lymphatics: Denies new lymphadenopathy or easy bruising Neurological:Denies new weaknesses. (+) Positive for  paraesthesias and numbness to the feet (baseline). Stable.  Behavioral/Psych: Mood is stable, no new changes  All other systems were reviewed with the patient and are negative.   PHYSICAL EXAMINATION:   ECOG PERFORMANCE STATUS: 1 BP 116/78 (BP Location: Left Arm, Patient Position: Sitting)   Pulse 90   Temp 98  F (36.7 C) (Oral)   Resp 20   Ht 5' 6.5" (1.689 m)   Wt 114 lb 8 oz (51.9 kg)   SpO2 96%   BMI 18.20 kg/m   GENERAL:alert, no distress and comfortable; easily mobile to exam table; kyphosis, moderate.  SKIN: skin color, texture, turgor are normal, no rashes or significant lesions except a small healing, dry wound in the right lower leg above his ankle, with surrounding skin erythema and swelling EYES: normal, Conjunctiva are pink, 1 tear/injection blood vessel left eye in sclera. OROPHARYNX:no exudate, no erythema and lips, buccal mucosa, and tongue normal, no ONJ NECK: supple, thyroid normal size, non-tender, without nodularity; nodules cervical bilaterally.  LYMPH:  no palpable lymphadenopathy in the cervical, axillary or supraclavicular LUNGS: (+) crackles in b/l lung base, likely inflammation, otherwise negative. HEART: regular rate & rhythm and no murmurs  ABDOMEN:abdomen soft, and normal bowel sounds  Musculoskeletal:no cyanosis of digits and no clubbing, NEURO: alert & oriented x 3 with fluent speech, no focal motor/sensory deficits   Labs:  CBC Latest Ref Rng & Units 01/22/2018 01/09/2018 01/02/2018  WBC 3.9 - 10.3 K/uL 3.3(L) 7.2 6.0  Hemoglobin 11.6 - 15.9 g/dL 9.9(L) 10.1(L) 9.0(L)  Hematocrit 34.8 - 46.6 % 29.7(L) 30.7(L) 26.3(L)  Platelets 145 - 400 K/uL 214 190 181    CMP Latest Ref Rng & Units 01/22/2018 01/09/2018 01/02/2018  Glucose 70 - 99 mg/dL 97 131(H) 92  BUN 8 - 23 mg/dL 16 20 16   Creatinine 0.44 - 1.00 mg/dL 0.93 0.85 0.74  Sodium 135 - 145 mmol/L 138 138 141  Potassium 3.5 - 5.1 mmol/L 4.0 4.0 3.8  Chloride 98 - 111 mmol/L 104 103 112(H)  CO2 22 -  32 mmol/L 27 24 23   Calcium 8.9 - 10.3 mg/dL 9.5 8.7(L) 8.5(L)  Total Protein 6.5 - 8.1 g/dL 6.2(L) 5.7(L) -  Total Bilirubin 0.3 - 1.2 mg/dL 0.6 0.8 -  Alkaline Phos 38 - 126 U/L 65 67 -  AST 15 - 41 U/L 17 22 -  ALT 0 - 44 U/L 13 16 -    SPEP M-PROTEIN  10/01/2013: 0.18 (nadir)  10/13/2014: 0.6 11/21/2014: 0.5 01/30/2015: 0.6 03/13/2015: 0.6 04/20/2015: 0.5 06/01/2015: 0.5 07/20/2015: 0.7 09/14/2015: 0.7 10/26/2015: 0.8 12/07/2015: 1.0 02/07/2016: 1.1  04/03/2016: 0.2 05/03/2016: 0.2  05/31/2016: 0.1 06/28/2016: 0.1 07/25/2016: 0.1 08/21/2016: 0.1 09/27/2016: 0.1 10/23/16: 0.1 12/27/2016: 0.1 (Dara Specific IFE was still positive) 01/24/17: 0.1 02/28/17: 0.1 03/28/17: 0.1 04/25/17: 0.1 05/23/17: 0.1 06/27/17: 0.1 08/01/17: 0.1 2:15/19: 0.1 09/26/17: 0.2 10/24/17: 0.1 11/21/17: 0.2 01/09/18: 0.2   IgG [phone]) 10/13/2014: 1710 11/21/2014: 1460 01/30/2015: 1380 03/13/15: 1570 04/20/2015: 1550 06/01/2015: 1590 07/22/2015: 1770 09/14/2015: 7414 10/26/2015: 1898 12/07/2015: 2045 02/07/2016: 1883 04/03/2016: 787 05/03/2016: 685  05/31/2016: 544 06/28/2016: 463 07/25/2016: 455 08/21/2016: 426 09/27/2016: 438 10/23/16: 374 12/06/16: 370 12/27/16: 343, 346 01/24/17: 332  02/28/17: 362 03/28/17:  436 04/25/17: 431 05/23/17: 471 06/27/17: 429 08/01/17: 392 08/29/17: 409 09/26/17: 421 10/24/17: 438 11/21/17: 465 01/09/18: 756  Serum kappa, lambda light chains, and ratio  11/21/2014: 3.95, 2.08, 1.90  01/30/2015: 3.92, 2.64, 1.48 03/13/2015: 3.55, 2.17, 1.64 04/20/15: 4.52, 1.78, 2.54 06/01/2015: 4.82, 1.75, 2.75 07/20/2015: 8.71, 2.43, 3.58 09/14/2015: 9.17, 2.02, 4.55 10/26/2015: 11.4, 1.75, 6.42 12/07/2015: 1.54, 1.83, 8.44 02/07/2016: 1.93, 1.42, 13.6 04/03/2016: 0.96, 0.95, 1.01  05/03/2016: 0.71, 0.34, 2.09  05/31/2016: 0.48, 0.26, 1.85 06/28/2016: 0.52, 0.60, 0.87 07/25/2016: 0.46, 0.27, 1.70 08/21/2016: 0.43, 0.27, 1.59 09/27/2016: 0.52, 0.24, 2.17 10/23/16: .0.49, 0.35, 1.40 12/06/16: 0.68,  0.29,  2.34 12/27/16: 0.54, 0.29, 1.86 02/28/17: 0.56, 0.30, 1.87 04/11/17: 0.61, 0.29, 2.10 05/09/17: 0.57, 0.33, 1.73 06/13/17: 0.65, 0.23, 2.83 07/18/17: 0.75 0.26, 2.83 08/15/17: 0.78, 0.27, 2.89 08/29/17: 0.79, 0.3, 2.63 09/26/17: 0.88, 0.26, 3.38  10/24/17: 0.95, 0.36, 2.64 11/21/17: 0.99, 0.24, 4.13 01/09/18: 1.79, 1.01, 1.77   24 hr urine PEP (total protein, M-spike) 10/13/2014: (-) 02/12/2016: 264m, 679m9/22/2017: 114 mg, (-) 08/08/16: 99, (-) 12/27/16: 5965m(-) 03/28/17: 43m75m-) 01/22/18: PENDING    PATHOLOGY REPORT  Diagnosis 01/24/2016 Bone Marrow, Aspirate,Biopsy, and Clot, RT iliac and core - VARIABLY CELLULAR BONE MARROW WITH PLASMA CELL NEOPLASM. - SEE COMMENT. PERIPHERAL BLOOD: - MACROCYTIC ANEMIA.  Diagnosis Note The bone marrow is variably cellular with trilineage hematopoiesis and non specific myeloid changes. In this background, the plasma cell component is variable ranging from less than 10 % in many areas to 30% in some aspirate particles with relatively low cellularity. Foci of interstitial infiltrates and small clusters are also seen in the clot section. Immunohistochemical stains show kappa light chain restriction in atypical plasma cell clusters and infiltrates consistent with plasma cell neoplasm. Correlation with cytogenetic and FISH studies is recommended. (BNS:gt, 01/1306-Aug-2017iagnosis 03/28/16 PLEURAL FLUID, LEFT (SPECIMEN 1 OF 1 COLLECTED 03/28/16): REACTIVE MESOTHELIAL CELLS  RADIOGRAPHIC STUDIES:  ECHO 03/12/17 Study Conclusions - Left ventricle: The cavity size was normal. Wall thickness was   increased in a pattern of mild LVH. Systolic function was normal.   The estimated ejection fraction was in the range of 50% to 55%.   There is hypokinesis of the basalinferior myocardium. Doppler   parameters are consistent with abnormal left ventricular   relaxation (grade 1 diastolic dysfunction). - Aortic valve: There was trivial regurgitation. - Mitral valve:  Calcified annulus. Mildly thickened leaflets .   There was trivial regurgitation. - Tricuspid valve: There was mild regurgitation. Impressions: - There has been mild improvement in EF since prior study.    Echo 12/03/16 -Left Ventricle: Systolic function was   mildly to moderately reduced. The estimated ejection fraction was   in the range of 40% to 45%. Diffuse hypokinesis. Doppler  parameters are consistent with abnormal left ventricular relaxation (grade 1 diastolic dysfunction). Doppler parameters are consistent with indeterminate ventricular filling pressure. - Left atrium: The atrium was severely dilated. - Tricuspid valve: There was trivial regurgitation. - Pulmonary arteries: Systolic pressure was within the normal   range. PA peak pressure: 33 mm Hg (S). - Global longitudinal strain -10.5% (abnormal)  Echo 08/26/2016 - Left ventricle: LVEF is approximately 35% with diffuse hypokinesis with severe hypokinesis/ akinesis of the inferior/inferoseptal walls. In comparison to echo images from December 2017, there does not appear to be significant change The cavity size was normal. Wall thickness was normal. Doppler parameters are consistent with abnormal left ventricular relaxation (grade 1 diastolic dysfunction). - Aortic valve: AV is thickened. 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 Present in echo from December 2017 There was trivial regurgitation. - Right ventricle: Systolic function was mildly reduced. - Right atrium: The atrium was mildly dilated. - Pericardium, extracardiac: A trivial pericardial effusion was identified.  ASSESSMENT: Shannon B[age]INIy81. female with a history of Multiple Myeloma s/p autotransplant in August 2014 followed by maintenance low dose Revlimid therapy, and relapsed in 01/2016   1. IgG kappa Multiple myeloma s/p Auto SCT, intermediate risk, relapsed in 01/2016 --MM markers demonstrate a very  good initial response (M protein baseline 2.6,  down to 0.18 after induction chemo and transplant, stable around 0.5 since July 2015, but has gradually increased lately),  she unfortunately relapsed in July 2017 -I previously discussed her repeated bone marrow biopsy from 02/01/2016, which showed 10-30% plasma cells in her marrow -I agreed with Dr. Kendell Bane recommendation to stop Revlimid and change to Daratumumab, Pomalidomide and dexamethasone due to her disease relapse. I don't think she has resistance to Revlimid, she has been on very small dose of Revlimid due to her prior history of multiple infections. -She started first cycle DaraPD in 02/2016, but 2 weeks later she developed severe pancytopenia, pneumonia with sepsis required 3 weeks hospital stay. She did have a very nice response to the treatment, her M-protein has significantly reduced from 1.2 to 0.2, light chain levels normalized even with half cycle treatment  -I previously recommended her to change regiment to DaraVD, and started her on dara every 2 weeks for 2 doses, she tolerated this very well -she has had good response to treatment, M-protein significantly dropped -Her previous echo in December 2017 showed a significant drop on EF, she is not much symptomatically for CHF, due to the potential contribution of Velcade to her CHF, I'll hold her Velcade for now. Her repeated echo in May 2018 showed a significant improvement, EF 40-45%. -her dara-specific IFE in 12/2016 was still positive, she has low level M-protein, she has achieved a good partial response. -She hasn't had Velcade since December 2017. Will continue holding it for now due to her CHF -We previously discussed her ECHO from 02/2017 and 06/2017 that showed her heart function has normalized to EF of 50% to 55% and 55-60% respectively.  -She has seen [facility] [name] at [facility] who suggests she use a combination treatment of Dara + low dose Pomalidomide with and increase treatment to  every 2 weeks.  -Due to her previous severe cytopenia and infection, and a prolonged excellent response to single agent Dara, I did not recommend her to change to DaraPD again. We decided to change Dara/dexa treatment to every 2 weeks on 03/28/17, she has been tolerating well overall. Her M-protein level has been stable (0.1-0.2) since then  -If she has disease progression, I would consider adding Velcade back -Treatment was previously held 11/21/17-01/09/18 due to hospitalization from recurrent pneumonia. I previously discussed we will change her dura to every 3-4 weeks due to her recurrent infection.  -Labs reviewed, her recent M Protein is stable at 0.2, CBC overall stable, CMP still pending.  -She is recovering well from recent infection and strong enough to  proceed with treatment next week before her trip to [place].   -I suggest she take antibiotics with her and avoid raw food and wear a mask if someone is sick on her trip.  -F/u in 5 weeks   2. Recurrent pneumonia  -Presented after her cruise with productive cough with white-yellowish phlegm, congestion, upper abdominal soreness, diarrhea/watery stool, , nausea, sore throat, no appetite, weight loss, fever and headaches. -Previous exam showed bilateral rales and rhonchi. She desatted to 85% on room air when she walks, highly suspicious for b/l pneumonia. -She previously had multiple episodes of pneumonia, which required ICU stay and prolonged hospital stay. She is immunocompromised due to her treatment -After ED recommendation she was admitted on 12/12/17 and was treated for pneumonia and acute hypoxic respiratory failure before she was discharged on 12/15/17 with oral antibiotics.  -She was again hospitalized on 12/30/17 as her pneumonia did not resolve. I previously suggested  ID consult and workup to rule out opportunistic infections as she is immunocompromised. She was discharged on 01/02/18. Her ID workup was negative. -She completed her antibiotics  and is recovering well. Still has mild productive cough. I suggest she take OTC muscinex to loosen her mucous.  -Today's Physical exam shows mild crackles at b/l base of lungs, likely inflammation or atlectasis. I suggest she does pulmonary exercises to keep her lungs open.   3. Pancytopenia  -secondary to Dara, slightly worse after we increased Dara to every 2 weeks  -Continue monitoring closely. -WBC at 3.3, HG at 2.91 Blood counts WNL except continued mild anemia with Hg at 10.1 (01/09/18)  4. CHF, CAD, inferior STEMI in 02/2014 -continue metoprolol and lipitor, Dr. Meda Coffee previously started her on lisinopril also -Dr. Meda Coffee plans to repeat her echo every 3 months while on treatment.  -I previously encouraged her to watch for leg swelling and SOB and chest pain as these are symptoms of her heart condition. -Her latest ECHO from 06/2017 shows improved heart function with EF of 55-60%.   5. Osteoporosis -previous bone density scan showed worsening osteoporosis, continue Zometa, changed to every 3 months after 03/2015.  -She declined repeating DEXA  -She was on Zometa for 4 years, the risk probably outweighs the benefit so we stopped it, last injection was 10/23/16  6. Peripheral neuropathy in feet, G1-2 -Secondary to previous chemotherapy  -We previously discussed the role of Neurontin, she declined. -I previously suggested Vitamin B complex and continue practice of circulation of her feet. I advised her to be careful to not fall.  -Stable, will continue to monitor, she will let us know if this worsens.   7. Goal of care discussion  -We previously discussed the incurable nature of her cancer, and the overall poor prognosis, especially if she does not have good response to chemotherapy or progress on chemo -The patient understands the goal of care is palliative. -she is full code now    Plan -Lab, daratumumab in one week and 5 weeks  -F/u in 5 weeks     All questions were answered.  The patient knows to call the clinic with any problems, questions or concerns. We can certainly see the patient much sooner if necessary.  I spent 20 minutes counseling the patient face to face. The total time spent in the appointment was 25 minutes.  Oneal Deputy, am acting as scribe for [name], MD.   I have reviewed the above documentation for accuracy and completeness, and I agree with the above.      [name]  01/22/2018

## 2018-01-22 NOTE — Progress Notes (Signed)
   Subjective:    Patient ID: Shannon Rush, female    DOB: 02-20-1941, 77 y.o.   MRN: [id number]  HPI FU back to back hospitalizations for pneumonia.  She seems to be recovering - she road her bike to the office visit today - but wonders if there is anything else to do.  She has check with her oncologist and is no more or less immunosuppressed than she has been for years.  She denies aspiration symptoms - no coughing or choking when she eats or drinks.  No neuro disorder that would affect swallowing.  She does have marked thoracic kyphosis secondary to osteoporosis, which might give her a tortuous esophagus and increase her aspiration risk.  No infectious exposures.  No unusual travel.  I reviewed her Xrays and CT and non are suggestive of an atypical process.  Also a never smoker.  She may have decreased lung function from her kyphosis and age.  Never smoker.  Not previously dxed with COPD.    Review of Systems     Objective:   Physical Exam  Lungs clear.        Assessment & Plan:

## 2018-01-23 ENCOUNTER — Telehealth: Payer: Self-pay | Admitting: Hematology

## 2018-01-23 LAB — UPEP/UIFE/LIGHT CHAINS/TP, 24-HR UR
% BETA, URINE: 0 %
ALPHA 1 URINE: 0 %
ALPHA 2 UR: 0 %
Albumin, U: 100 %
FREE LAMBDA LT CHAINS, UR: 0.43 mg/L (ref 0.24–6.66)
Free Kappa Lt Chains,Ur: 11.5 mg/L (ref 1.35–24.19)
Free Kappa/Lambda Ratio: 26.74 — ABNORMAL HIGH (ref 2.04–10.37)
GAMMA GLOBULIN URINE: 0 %
TOTAL PROTEIN, URINE-UR/DAY: 99 mg/(24.h) (ref 30–150)
Total Protein, Urine: 6.4 mg/dL
Total Volume: 1550

## 2018-01-23 NOTE — Telephone Encounter (Signed)
Appointments scheduled and patient has been notified regarding this per 7/11 los

## 2018-01-26 ENCOUNTER — Emergency Department (HOSPITAL_COMMUNITY): Payer: Medicare Other

## 2018-01-26 ENCOUNTER — Other Ambulatory Visit: Payer: Self-pay

## 2018-01-26 ENCOUNTER — Encounter (HOSPITAL_COMMUNITY): Payer: Self-pay | Admitting: Emergency Medicine

## 2018-01-26 ENCOUNTER — Observation Stay (HOSPITAL_COMMUNITY)
Admission: EM | Admit: 2018-01-26 | Discharge: 2018-01-27 | Disposition: A | Discharge status: Home / Self Care | Payer: Medicare Other | Attending: Family Medicine | Admitting: Family Medicine

## 2018-01-26 DIAGNOSIS — C9000 Multiple myeloma not having achieved remission: Secondary | ICD-10-CM

## 2018-01-26 DIAGNOSIS — Z880 Allergy status to penicillin: Secondary | ICD-10-CM | POA: Insufficient documentation

## 2018-01-26 DIAGNOSIS — S59902A Unspecified injury of left elbow, initial encounter: Secondary | ICD-10-CM | POA: Diagnosis not present

## 2018-01-26 DIAGNOSIS — Z881 Allergy status to other antibiotic agents status: Secondary | ICD-10-CM | POA: Insufficient documentation

## 2018-01-26 DIAGNOSIS — M810 Age-related osteoporosis without current pathological fracture: Secondary | ICD-10-CM | POA: Insufficient documentation

## 2018-01-26 DIAGNOSIS — Z79899 Other long term (current) drug therapy: Secondary | ICD-10-CM | POA: Diagnosis not present

## 2018-01-26 DIAGNOSIS — I2510 Atherosclerotic heart disease of native coronary artery without angina pectoris: Secondary | ICD-10-CM | POA: Diagnosis not present

## 2018-01-26 DIAGNOSIS — Z9221 Personal history of antineoplastic chemotherapy: Secondary | ICD-10-CM | POA: Diagnosis not present

## 2018-01-26 DIAGNOSIS — I428 Other cardiomyopathies: Secondary | ICD-10-CM

## 2018-01-26 DIAGNOSIS — D61818 Other pancytopenia: Secondary | ICD-10-CM | POA: Insufficient documentation

## 2018-01-26 DIAGNOSIS — S32502A Unspecified fracture of left pubis, initial encounter for closed fracture: Secondary | ICD-10-CM | POA: Diagnosis not present

## 2018-01-26 DIAGNOSIS — S32599A Other specified fracture of unspecified pubis, initial encounter for closed fracture: Secondary | ICD-10-CM | POA: Diagnosis present

## 2018-01-26 DIAGNOSIS — Z923 Personal history of irradiation: Secondary | ICD-10-CM | POA: Diagnosis not present

## 2018-01-26 DIAGNOSIS — W19XXXA Unspecified fall, initial encounter: Secondary | ICD-10-CM | POA: Diagnosis not present

## 2018-01-26 DIAGNOSIS — W010XXA Fall on same level from slipping, tripping and stumbling without subsequent striking against object, initial encounter: Secondary | ICD-10-CM | POA: Diagnosis not present

## 2018-01-26 DIAGNOSIS — S32592A Other specified fracture of left pubis, initial encounter for closed fracture: Principal | ICD-10-CM

## 2018-01-26 DIAGNOSIS — S4992XA Unspecified injury of left shoulder and upper arm, initial encounter: Secondary | ICD-10-CM | POA: Diagnosis not present

## 2018-01-26 DIAGNOSIS — M25522 Pain in left elbow: Secondary | ICD-10-CM | POA: Diagnosis not present

## 2018-01-26 DIAGNOSIS — M25512 Pain in left shoulder: Secondary | ICD-10-CM | POA: Diagnosis not present

## 2018-01-26 DIAGNOSIS — I959 Hypotension, unspecified: Secondary | ICD-10-CM | POA: Diagnosis not present

## 2018-01-26 DIAGNOSIS — Z66 Do not resuscitate: Secondary | ICD-10-CM | POA: Diagnosis not present

## 2018-01-26 DIAGNOSIS — R262 Difficulty in walking, not elsewhere classified: Secondary | ICD-10-CM | POA: Insufficient documentation

## 2018-01-26 DIAGNOSIS — I252 Old myocardial infarction: Secondary | ICD-10-CM | POA: Insufficient documentation

## 2018-01-26 DIAGNOSIS — M25552 Pain in left hip: Secondary | ICD-10-CM | POA: Diagnosis not present

## 2018-01-26 DIAGNOSIS — D539 Nutritional anemia, unspecified: Secondary | ICD-10-CM | POA: Diagnosis present

## 2018-01-26 MED ORDER — MORPHINE SULFATE (PF) 4 MG/ML IV SOLN
4.0000 mg | Freq: Once | INTRAVENOUS | Status: AC
  Administered 2018-01-27: 4 mg via INTRAVENOUS
  Filled 2018-01-26: qty 1

## 2018-01-26 NOTE — ED Triage Notes (Signed)
Pt presents by [facility] for fall and landed on left hip along with left shoulder and elbow. EMS reports pt has rod in left leg and is a multiple meyloma pt with chemo 01/22/18.

## 2018-01-26 NOTE — ED Notes (Signed)
Bed: RESB Expected date:  Expected time:  Means of arrival:  Comments: 77 yo F/Fall-hip injury

## 2018-01-27 ENCOUNTER — Telehealth: Payer: Self-pay

## 2018-01-27 ENCOUNTER — Encounter (HOSPITAL_COMMUNITY): Payer: Self-pay | Admitting: Radiology

## 2018-01-27 ENCOUNTER — Emergency Department (HOSPITAL_COMMUNITY): Payer: Medicare Other

## 2018-01-27 ENCOUNTER — Ambulatory Visit: Payer: Medicare Other | Admitting: Cardiology

## 2018-01-27 ENCOUNTER — Observation Stay (HOSPITAL_COMMUNITY): Payer: Medicare Other

## 2018-01-27 DIAGNOSIS — R0602 Shortness of breath: Secondary | ICD-10-CM | POA: Diagnosis not present

## 2018-01-27 DIAGNOSIS — R05 Cough: Secondary | ICD-10-CM | POA: Diagnosis not present

## 2018-01-27 DIAGNOSIS — D539 Nutritional anemia, unspecified: Secondary | ICD-10-CM | POA: Diagnosis not present

## 2018-01-27 DIAGNOSIS — S32599A Other specified fracture of unspecified pubis, initial encounter for closed fracture: Secondary | ICD-10-CM | POA: Diagnosis present

## 2018-01-27 DIAGNOSIS — S32592A Other specified fracture of left pubis, initial encounter for closed fracture: Secondary | ICD-10-CM | POA: Diagnosis not present

## 2018-01-27 LAB — CBC WITH DIFFERENTIAL/PLATELET
BASOS PCT: 1 %
Basophils Absolute: 0 10*3/uL (ref 0.0–0.1)
EOS ABS: 0.1 10*3/uL (ref 0.0–0.7)
Eosinophils Relative: 2 %
HCT: 30 % — ABNORMAL LOW (ref 36.0–46.0)
Hemoglobin: 10 g/dL — ABNORMAL LOW (ref 12.0–15.0)
Lymphocytes Relative: 14 %
Lymphs Abs: 0.7 10*3/uL (ref 0.7–4.0)
MCH: 34 pg (ref 26.0–34.0)
MCHC: 33.3 g/dL (ref 30.0–36.0)
MCV: 102 fL — ABNORMAL HIGH (ref 78.0–100.0)
MONO ABS: 0.4 10*3/uL (ref 0.1–1.0)
MONOS PCT: 8 %
Neutro Abs: 3.7 10*3/uL (ref 1.7–7.7)
Neutrophils Relative %: 75 %
Platelets: 229 10*3/uL (ref 150–400)
RBC: 2.94 MIL/uL — ABNORMAL LOW (ref 3.87–5.11)
RDW: 15.8 % — AB (ref 11.5–15.5)
WBC: 4.8 10*3/uL (ref 4.0–10.5)

## 2018-01-27 LAB — BASIC METABOLIC PANEL
ANION GAP: 8 (ref 5–15)
ANION GAP: 8 (ref 5–15)
BUN: 19 mg/dL (ref 8–23)
BUN: 22 mg/dL (ref 8–23)
CALCIUM: 8.8 mg/dL — AB (ref 8.9–10.3)
CALCIUM: 9.5 mg/dL (ref 8.9–10.3)
CHLORIDE: 105 mmol/L (ref 98–111)
CHLORIDE: 105 mmol/L (ref 98–111)
CO2: 24 mmol/L (ref 22–32)
CO2: 27 mmol/L (ref 22–32)
Creatinine, Ser: 0.78 mg/dL (ref 0.44–1.00)
Creatinine, Ser: 0.86 mg/dL (ref 0.44–1.00)
GFR calc Af Amer: >60 mL/min (ref >60)
GFR calc non Af Amer: >60 mL/min (ref >60)
GFR calc non Af Amer: >60 mL/min (ref >60)
GLUCOSE: 106 mg/dL — AB (ref 70–99)
GLUCOSE: 117 mg/dL — AB (ref 70–99)
Potassium: 3.7 mmol/L (ref 3.5–5.1)
Potassium: 4 mmol/L (ref 3.5–5.1)
Sodium: 137 mmol/L (ref 135–145)
Sodium: 140 mmol/L (ref 135–145)

## 2018-01-27 LAB — CBC
HEMATOCRIT: 27.7 % — AB (ref 36.0–46.0)
HEMOGLOBIN: 9.3 g/dL — AB (ref 12.0–15.0)
MCH: 34.1 pg — AB (ref 26.0–34.0)
MCHC: 33.6 g/dL (ref 30.0–36.0)
MCV: 101.5 fL — AB (ref 78.0–100.0)
Platelets: 209 10*3/uL (ref 150–400)
RBC: 2.73 MIL/uL — AB (ref 3.87–5.11)
RDW: 16.1 % — ABNORMAL HIGH (ref 11.5–15.5)
WBC: 4.5 10*3/uL (ref 4.0–10.5)

## 2018-01-27 MED ORDER — ADULT MULTIVITAMIN W/MINERALS CH
1.0000 | ORAL_TABLET | Freq: Every day | ORAL | Status: DC
  Administered 2018-01-27: 1 via ORAL
  Filled 2018-01-27: qty 1

## 2018-01-27 MED ORDER — ONDANSETRON HCL 4 MG PO TABS: 4.0000 mg | ORAL_TABLET | Freq: Four times a day (QID) | ORAL | Status: DC | PRN

## 2018-01-27 MED ORDER — ACETAMINOPHEN 650 MG RE SUPP
650.0000 mg | Freq: Four times a day (QID) | RECTAL | Status: DC | PRN
  Filled 2018-01-27: qty 1

## 2018-01-27 MED ORDER — ACETAMINOPHEN 325 MG PO TABS
650.0000 mg | ORAL_TABLET | Freq: Four times a day (QID) | ORAL | Status: DC | PRN
  Administered 2018-01-27 (×2): 650 mg via ORAL
  Filled 2018-01-27 (×2): qty 2

## 2018-01-27 MED ORDER — ENOXAPARIN SODIUM 40 MG/0.4ML ~~LOC~~ SOLN
40.0000 mg | SUBCUTANEOUS | Status: DC
  Administered 2018-01-27: 40 mg via SUBCUTANEOUS
  Filled 2018-01-27: qty 0.4

## 2018-01-27 MED ORDER — ONDANSETRON HCL 4 MG/2ML IJ SOLN: 4.0000 mg | Freq: Four times a day (QID) | INTRAMUSCULAR | Status: DC | PRN

## 2018-01-27 MED ORDER — MORPHINE SULFATE (PF) 2 MG/ML IV SOLN
1.0000 mg | INTRAVENOUS | Status: DC | PRN
Start: 2018-01-27 — End: 2018-01-27
  Administered 2018-01-27 (×2): 1 mg via INTRAVENOUS
  Filled 2018-01-27 (×2): qty 1

## 2018-01-27 MED ORDER — DOCUSATE SODIUM 100 MG PO CAPS: 100.0000 mg | ORAL_CAPSULE | Freq: Two times a day (BID) | ORAL | 0 refills | Status: AC

## 2018-01-27 MED ORDER — CALCIUM CARBONATE 1250 (500 CA) MG PO TABS
1250.0000 mg | ORAL_TABLET | Freq: Every day | ORAL | Status: DC
  Administered 2018-01-27: 1250 mg via ORAL
  Filled 2018-01-27: qty 1

## 2018-01-27 MED ORDER — ACYCLOVIR 400 MG PO TABS
400.0000 mg | ORAL_TABLET | Freq: Two times a day (BID) | ORAL | Status: DC
  Administered 2018-01-27: 400 mg via ORAL
  Filled 2018-01-27: qty 1

## 2018-01-27 MED ORDER — METOPROLOL SUCCINATE ER 25 MG PO TB24
25.0000 mg | ORAL_TABLET | Freq: Every day | ORAL | Status: DC
  Administered 2018-01-27: 25 mg via ORAL
  Filled 2018-01-27: qty 1

## 2018-01-27 MED ORDER — POLYETHYLENE GLYCOL 3350 17 G PO PACK: 17.0000 g | PACK | Freq: Every day | ORAL | 0 refills | Status: DC | PRN

## 2018-01-27 MED ORDER — ATORVASTATIN CALCIUM 20 MG PO TABS
20.0000 mg | ORAL_TABLET | Freq: Every day | ORAL | Status: DC
  Administered 2018-01-27: 20 mg via ORAL
  Filled 2018-01-27: qty 1

## 2018-01-27 MED ORDER — MORPHINE SULFATE 15 MG PO TABS: 15.0000 mg | ORAL_TABLET | Freq: Four times a day (QID) | ORAL | 0 refills | Status: AC | PRN

## 2018-01-27 NOTE — H&P (Signed)
History and Physical    Shannon Rush PNT:[id number] DOB: Sep 10, 1940 DOA: 01/26/2018  PCP: [name], MD  Patient coming from: Home.  Chief Complaint: Fall.  HPI: Shannon Rush is a 77 y.o. female with history of multiple myeloma on chemotherapy, CAD, CHF, macrocytic anemia presents to the ER after patient had a fall.  Patient tripped on her dog's leash and fell onto the side.  Since the fall patient has been having pain on the left side.  Denies any loss of consciousness shortness of breath palpitations.  ED Course: In the ER CT pelvis shows left superior and inferior comminuted pubic rami fracture.  X-rays of the left elbow was unremarkable but x-ray of the left shoulder shows slight increase in acromio humeral distance but patient has no restriction of movement.  Patient admitted for further pain management and possible rehab.  Review of Systems: As per HPI, rest all negative.   Past Medical History:  Diagnosis Date  . CAD (coronary artery disease)    a. inf STEMI (in [place] >>> [facility]) >>> LHC (8/15- [place]):  pRCA 60%, mid to dist RCA 50% >>> med rx  . Cord compression ([place]) 07/13/12   MRI- diffuse myeloma involvement of T-L spine  . History of radiation therapy 07/13/12-07/27/12   spinal cord compression T3-T10,left scapula  . Hx of echocardiogram    Echo (03/02/14-done at [facility]):  Normal LVEF without significant regional wall motion abnormalities. Mild   . Multiple myeloma ([place]) 07/01/2012  . OSTEOPOROSIS 06/11/2010   Multiple compression fractures; and spontaneous fracture of sternum Qualifier: Diagnosis of  By: [name], [name]     . Thoracic kyphosis 07/13/12   per MRI scan  . Unspecified deficiency anemia     Past Surgical History:  Procedure Laterality Date  . APPENDECTOMY    . CESAREAN SECTION     x2   . COLONOSCOPY  2007   neg with Dr. [name]  . ELBOW SURGERY    . HIP SURGERY  2009   left  .  TUBAL LIGATION       reports that she has never smoked. She has never used smokeless tobacco. She reports that she drinks about 4.2 oz of alcohol per week. She reports that she does not use drugs.  Allergies  Allergen Reactions  . Zithromax [Azithromycin] Rash    Full body rash  . Zosyn [Piperacillin Sod-Tazobactam So] Swelling and Rash    Itching, rash and swelling of extremities Tolerates Rocephin  . Quinolones Rash    Cipro, Levaquin implicated Cipro also caused vomiting and abd pain    Family History  Problem Relation Age of Onset  . Glaucoma Mother   . Heart disease Mother   . Peripheral vascular disease Mother   . Raynaud syndrome Mother   . Heart disease Father   . Heart failure Father   . Glaucoma Father   . Cancer Brother        stage IV prostate CA  . Raynaud syndrome Daughter   . Raynaud syndrome Daughter     Prior to Admission medications   Medication Sig Start Date End Date Taking? Authorizing Provider  acetaminophen (TYLENOL) 500 MG tablet Take 500 mg by mouth every 6 (six) hours as needed for moderate pain or fever.   Yes [provider]  acyclovir (ZOVIRAX) 400 MG tablet Take 1 tablet (400 mg total) by mouth 2 (two) times daily. 04/24/17  Yes Burr Medico,  Krista Blue, MD  atorvastatin (LIPITOR) 20 MG tablet Take 1 tablet (20 mg total) by mouth daily. 08/06/17 08/01/18 Yes [name], MD  B Complex-C (SUPER B COMPLEX PO) Take 1 tablet by mouth daily.   Yes [provider]  calcium carbonate (OS-CAL) 600 MG TABS tablet Take 600 mg by mouth daily.   Yes [provider]  dexamethasone (DECADRON) 4 MG tablet Take 5 tab the day before chemo, every 2 weeks 09/26/17  Yes [name], MD  metoprolol succinate (TOPROL-XL) 25 MG 24 hr tablet Take 1 tablet (25 mg total) by mouth daily. 12/12/17  Yes [name], MD  Multiple Vitamin (MULTIVITAMIN WITH MINERALS) TABS tablet Take 1 tablet by mouth daily. 01/02/18  Yes Lavina Hamman, MD  fluticasone  (FLONASE) 50 MCG/ACT nasal spray Place 2 sprays into both nostrils daily. Patient not taking: Reported on 01/27/2018 02/15/16   Lorna Few, [place]    Physical Exam: Vitals:   01/27/18 0100 01/27/18 0130 01/27/18 0200 01/27/18 0301  BP: 122/72 122/72 129/73 (!) 141/72  Pulse: 88 88 84 89  Resp: 17 17 17 16   Temp:    99.5 F (37.5 C)  TempSrc:    Oral  SpO2: 99% 99% 99% 95%  Weight:      Height:          Constitutional: Moderately built and nourished. Vitals:   01/27/18 0100 01/27/18 0130 01/27/18 0200 01/27/18 0301  BP: 122/72 122/72 129/73 (!) 141/72  Pulse: 88 88 84 89  Resp: 17 17 17 16   Temp:    99.5 F (37.5 C)  TempSrc:    Oral  SpO2: 99% 99% 99% 95%  Weight:      Height:       Eyes: Anicteric no pallor. ENMT: No discharge from the ears eyes nose or mouth. Neck: No mass felt.  No neck rigidity. Respiratory: No rhonchi or crepitations. Cardiovascular: S1-S2 heard no murmurs appreciated. Abdomen: Soft nontender bowel sounds present. Musculoskeletal: Pain on moving left hip. Skin: Chronic skin changes. Neurologic: Alert awake oriented to time place and person.  Moves all extremities. Psychiatric: Appears normal per normal affect.   Labs on Admission: I have personally reviewed following labs and imaging studies  CBC: Recent Labs  Lab 01/22/18 1436 01/26/18 2349  WBC 3.3* 4.8  NEUTROABS 1.9 3.7  HGB 9.9* 10.0*  HCT 29.7* 30.0*  MCV 102.1* 102.0*  PLT 214 233   Basic Metabolic Panel: Recent Labs  Lab 01/22/18 1436 01/26/18 2349  NA 138 140  K 4.0 3.7  CL 104 105  CO2 27 27  GLUCOSE 97 117*  BUN 16 22  CREATININE 0.93 0.86  CALCIUM 9.5 9.5   GFR: Estimated Creatinine Clearance: 45.3 mL/min (by C-G formula based on SCr of 0.86 mg/dL). Liver Function Tests: Recent Labs  Lab 01/22/18 1436  AST 17  ALT 13  ALKPHOS 65  BILITOT 0.6  PROT 6.2*  ALBUMIN 3.3*   No results for input(s): LIPASE, AMYLASE in the last 168 hours. No results for  input(s): AMMONIA in the last 168 hours. Coagulation Profile: No results for input(s): INR, PROTIME in the last 168 hours. Cardiac Enzymes: No results for input(s): CKTOTAL, CKMB, CKMBINDEX, TROPONINI in the last 168 hours. BNP (last 3 results) No results for input(s): PROBNP in the last 8760 hours. HbA1C: No results for input(s): HGBA1C in the last 72 hours. CBG: No results for input(s): GLUCAP in the last 168 hours. Lipid Profile: No results for  input(s): CHOL, HDL, LDLCALC, TRIG, CHOLHDL, LDLDIRECT in the last 72 hours. Thyroid Function Tests: No results for input(s): TSH, T4TOTAL, FREET4, T3FREE, THYROIDAB in the last 72 hours. Anemia Panel: No results for input(s): VITAMINB12, FOLATE, FERRITIN, TIBC, IRON, RETICCTPCT in the last 72 hours. Urine analysis:    Component Value Date/Time   COLORURINE YELLOW 12/30/2017 2214   APPEARANCEUR CLEAR 12/30/2017 2214   LABSPEC 1.018 12/30/2017 2214   LABSPEC 1.015 08/20/2012 1124   PHURINE 7.0 12/30/2017 2214   GLUCOSEU NEGATIVE 12/30/2017 2214   GLUCOSEU Negative 08/20/2012 1124   HGBUR NEGATIVE 12/30/2017 2214   HGBUR negative 10/22/2007 1336   BILIRUBINUR NEGATIVE 12/30/2017 2214   BILIRUBINUR Negative 08/20/2012 1124   KETONESUR 5 (A) 12/30/2017 2214   PROTEINUR NEGATIVE 12/30/2017 2214   UROBILINOGEN 0.2 09/14/2014 2100   UROBILINOGEN 0.2 08/20/2012 1124   NITRITE NEGATIVE 12/30/2017 2214   LEUKOCYTESUR NEGATIVE 12/30/2017 2214   LEUKOCYTESUR Small 08/20/2012 1124   Sepsis Labs: @LABRCNTIP (procalcitonin:4,lacticidven:4) )No results found for this or any previous visit (from the past 240 hour(s)).   Radiological Exams on Admission: Dg Elbow Complete Left  Result Date: 01/26/2018 CLINICAL DATA:  77 year old female with fall and left elbow pain. EXAM: LEFT ELBOW - COMPLETE 3+ VIEW COMPARISON:  Left elbow radiograph dated 05/18/2012. FINDINGS: There is no acute fracture or dislocation. Wire and fixation pin of an old olecranon  process fracture again noted similar to prior radiograph. No hardware fracture. There is mild degenerative changes. The bones are osteopenic. No joint effusion. The soft tissues appear unremarkable. IMPRESSION: No acute fracture or dislocation. Electronically Signed   By: Anner Crete M.D.   On: 01/26/2018 23:00   Ct Pelvis Wo Contrast  Result Date: 01/27/2018 CLINICAL DATA:  Pain after fall at home. Status post stem cell transplant August 2014 for multiple myeloma. Ongoing chemotherapy. EXAM: CT PELVIS WITHOUT CONTRAST TECHNIQUE: Multidetector CT imaging of the pelvis was performed following the standard protocol without intravenous contrast. COMPARISON:  Same day pelvic radiographs., pelvic CT from 01/24/2016 from bone marrow biopsy. FINDINGS: Urinary Tract: Intact urinary bladder without extravasation. No distal uropathy. Bowel: Nonobstructed nondistended small and large bowel loops noted within the pelvis. No mural hematoma. Vascular/Lymphatic: Mild aortoiliac atherosclerosis. No lymphadenopathy. Reproductive:  No mass or other significant abnormality Other:  None Musculoskeletal: 1. Acute, comminuted left superior and inferior pubic rami fractures with slight caudal displacement involving the left superior pubic ramus fracture. Comminuted left inferior pubic ramus fracture is without significant displacement. 2. The partially included indwelling left femoral nail fixation hardware appears intact. No proximal femoral fracture. 3. Multiple lytic lesions of the included lower lumbar spine and bony pelvis consistent with known multiple myeloma. A lytic lesion involving L5 measures approximately 3 x 2 x 2 cm. Slightly expansile lytic lesion of the left iliac bone adjacent to the SI joint measures approximately 3.1 x 2.1 x 3.2 cm and is relatively stable. The fracture lucency seen through this lesion previously appears to have healed in the interim. Bilateral iliac crest lytic lucencies are noted on the left  measuring 1.2 x 1.3 x 0.9 cm and on the right measuring 1.6 x 1.2 x 1.7 cm. A right iliac lytic lesion adjacent to the right SI joint measures 1.6 x 1.4 x 1.9 cm. Subcentimeter smaller scattered lytic foci are noted of the bony pelvis. There is lower lumbar facet arthropathy. IMPRESSION: 1. Redemonstration of multiple lytic lesions of the pelvis compatible with known multiple myeloma as above described. No pathologic fracture seen  through these lesions. A subtle fracture lucency involving an expansile lytic lesion of the left iliac bone on prior bone marrow biopsy appears to have healed in the interim. 2. Acute comminuted left superior and inferior pubic rami fractures as above described. No fracture of the proximal femora nor hips. Electronically Signed   By: Ashley Royalty M.D.   On: 01/27/2018 00:59   Dg Shoulder Left  Result Date: 01/26/2018 CLINICAL DATA:  77 year old female with fall and left shoulder pain. EXAM: LEFT SHOULDER - 2+ VIEW COMPARISON:  Left shoulder radiograph dated 06/01/2008 FINDINGS: There is no acute fracture. Old healed left clavicular and left rib fractures. There is slight inferior positioning of the humerus with widening of the acromial humeral distance. The head of the humerus however appears in alignment with the glenoid. This may be positional or represent joint effusion. If there is clinical concern for shoulder dislocation additional images including Y-view and axial projection may provide better evaluation. The soft tissues are unremarkable. IMPRESSION: 1. No acute fracture. 2. Increased acromial humeral distance, likely positional. Clinical correlation is recommended to exclude shoulder dislocation. Electronically Signed   By: Anner Crete M.D.   On: 01/26/2018 23:04   Dg Hip Unilat With Pelvis 2-3 Views Left  Result Date: 01/26/2018 CLINICAL DATA:  Patient fell landing on left hip. Posterior hip pain with limited movement. EXAM: DG HIP (WITH OR WITHOUT PELVIS) 2-3V LEFT  COMPARISON:  None. FINDINGS: Acute, closed left-sided superior and inferior pubic rami fractures with slight comminution and minimal displacement. Intact left femoral nail fixation of the femur. The bony pelvis and right hip appear intact otherwise. No pelvic diastasis is noted. There is lower lumbar degenerative disc and facet arthropathy. Mid to lower sacrum and coccyx are obscured by bowel. IMPRESSION: Acute slightly comminuted and minimally displaced left superior and inferior pubic rami fractures. Electronically Signed   By: Ashley Royalty M.D.   On: 01/26/2018 22:14      Assessment/Plan Principal Problem:   Closed fracture of multiple pubic rami (HCC) Active Problems:   Anemia, macrocytic   Multiple myeloma (HCC)   Pancytopenia (HCC)   Non-ischemic cardiomyopathy (HCC)   Closed fracture of multiple pubic rami, initial encounter ([place])    1. Left-sided superior and inferior rami comminuted fracture -we will keep patient on pain relief medication get physical therapy consult.  Likely manage conservatively.  May discuss with orthopedics in a.m.  Since patient has mild left-sided rib pain chest x-ray has been ordered. 2. History of multiple myeloma being followed by Dr. Burr Medico. 3. History of CAD and CHF presently appears compensated.  On Lipitor and metoprolol. 4. Microcytic anemia follow CBC.   DVT prophylaxis: Lovenox. Code Status: DNR. Family Communication: Discussed with patient. Disposition Plan: May need rehab. Consults called: Physical therapy. Admission status: Observation.   Rise Patience MD [facility] Pager [phone].  If 7PM-7AM, please contact night-coverage [email or web address] Password [facility]  01/27/2018, 3:53 AM

## 2018-01-27 NOTE — Discharge Summary (Signed)
Physician Discharge Summary  Shannon Rush  TOI:[id number]  DOB: 04/15/1941  DOA: 01/26/2018 PCP: [name], MD  Admit date: 01/26/2018 Discharge date: 01/27/2018  Admitted From: Home Disposition: Home   Recommendations for Outpatient Follow-up:  1. Follow up with PCP in 1 week  2. Please obtain BMP/CBC in one week to monitor Hgb and renal function  3. Follow up with orthopedic surgery in 2-3 weeks. Information in placed in d/c paper  Home Health: PT/OT  Equipment/Devices: 3:1 and RW   Discharge Condition: Stable  CODE STATUS: Full Code  Diet recommendation: Heart Healthy   Brief/Interim Summary: For full details see H&P/Progress note, but in brief, Shannon Rush is a 77 year old female with medical history of multiple myeloma on chemotherapy, CAD, CHF, microcytic anemia presented to the emergency department after a mechanical fall subsequently patient was having pain on her left side.  Upon ED evaluation CT of the pelvis showed left superior and inferior comminuted pubic rami fracture.  Patient was admitted for pain management and physical therapy evaluation.  Case was discussed with orthopedic surgery who recommended conservative management and pain control.  Pain was well controlled transitioned to p.o. pain medication.  She worked with PT and recommended home health PT.  She was deemed stable for discharge and follow-up as an outpatient.  Subjective: Patient seen and examined, she reported her pain is well controlled.  Work with physical therapy with minimal issues.  Patient feels safe to be at home and have good support system.  Denies chest pain, shortness of breath and dizziness.  No acute events overnight.  Discharge Diagnoses/Hospital Course:  Left superior and inferior rami comminuted fracture Case discussed with orthopedic surgery who recommended conservative management, pain control and weight bear as tolerated.  She will work with physical therapy and recommended  home health PT.  Will discharge on morphine IR as does not tolerate oxycodone.  Has had morphine IR in the past and worked well for her.  Also provided prescription for Colace and MiraLAX.  Advised to follow-up with orthopedic surgery in 2 to 3 weeks.   All other chronic medical condition were stable during the hospitalization.  Patient was seen by physical therapy, recommending home health PT  On the day of the discharge the patient's vitals were stable, and no other acute medical condition were reported by patient. the patient was felt safe to be discharge to home.   Discharge Instructions  You were cared for by a hospitalist during your hospital stay. If you have any questions about your discharge medications or the care you received while you were in the hospital after you are discharged, you can call the unit and asked to speak with the hospitalist on call if the hospitalist that took care of you is not available. Once you are discharged, your primary care physician will handle any further medical issues. Please note that NO REFILLS for any discharge medications will be authorized once you are discharged, as it is imperative that you return to your primary care physician (or establish a relationship with a primary care physician if you do not have one) for your aftercare needs so that they can reassess your need for medications and monitor your lab values.  Discharge Instructions    Call MD for:  difficulty breathing, headache or visual disturbances   Complete by:  As directed    Call MD for:  extreme fatigue   Complete by:  As directed    Call MD for:  hives   Complete by:  As directed    Call MD for:  persistant dizziness or light-headedness   Complete by:  As directed    Call MD for:  persistant nausea and vomiting   Complete by:  As directed    Call MD for:  redness, tenderness, or signs of infection (pain, swelling, redness, odor or green/yellow discharge around incision site)    Complete by:  As directed    Call MD for:  severe uncontrolled pain   Complete by:  As directed    Call MD for:  temperature >100.4   Complete by:  As directed    Diet - low sodium heart healthy   Complete by:  As directed    Discharge instructions   Complete by:  As directed    Bear weight as tolerated Follow up with primary care provider 1 week  Follow up with orthopedic surgery in 2-3 weeks Avoid exercises for now   Increase activity slowly   Complete by:  As directed      Allergies as of 01/27/2018      Reactions   Zithromax [azithromycin] Rash   Full body rash   Zosyn [piperacillin Sod-tazobactam So] Swelling, Rash   Itching, rash and swelling of extremities Tolerates Rocephin   Quinolones Rash   Cipro, Levaquin implicated Cipro also caused vomiting and abd pain      Medication List    STOP taking these medications   fluticasone 50 MCG/ACT nasal spray Commonly known as:  FLONASE     TAKE these medications   acetaminophen 500 MG tablet Commonly known as:  TYLENOL Take 500 mg by mouth every 6 (six) hours as needed for moderate pain or fever.   acyclovir 400 MG tablet Commonly known as:  ZOVIRAX Take 1 tablet (400 mg total) by mouth 2 (two) times daily.   atorvastatin 20 MG tablet Commonly known as:  LIPITOR Take 1 tablet (20 mg total) by mouth daily.   calcium carbonate 600 MG Tabs tablet Commonly known as:  OS-CAL Take 600 mg by mouth daily.   dexamethasone 4 MG tablet Commonly known as:  DECADRON Take 5 tab the day before chemo, every 2 weeks   docusate sodium 100 MG capsule Commonly known as:  COLACE Take 1 capsule (100 mg total) by mouth 2 (two) times daily.   metoprolol succinate 25 MG 24 hr tablet Commonly known as:  TOPROL-XL Take 1 tablet (25 mg total) by mouth daily.   morphine 15 MG tablet Commonly known as:  MSIR Take 1 tablet (15 mg total) by mouth every 6 (six) hours as needed for up to 5 days for severe pain.   multivitamin with  minerals Tabs tablet Take 1 tablet by mouth daily.   polyethylene glycol packet Commonly known as:  MIRALAX Take 17 g by mouth daily as needed.   SUPER B COMPLEX PO Take 1 tablet by mouth daily.            Durable Medical Equipment  (From admission, onward)        Start     Ordered   01/27/18 1716  DME 3-in-1  Once     01/27/18 1718     Follow-up Information    [name], Jamal Collin, MD. Schedule an appointment as soon as possible for a visit in 1 week(s).   Specialty:  Family Medicine Why:  Hospital follow up  Contact information: [address] [place] [address] [phone]  Allergies  Allergen Reactions  . Zithromax [Azithromycin] Rash    Full body rash  . Zosyn [Piperacillin Sod-Tazobactam So] Swelling and Rash    Itching, rash and swelling of extremities Tolerates Rocephin  . Quinolones Rash    Cipro, Levaquin implicated Cipro also caused vomiting and abd pain    Consultations:  None    Procedures/Studies: Dg Chest 2 View  Result Date: 12/30/2017 CLINICAL DATA:  Cough and weakness for 72 hours. EXAM: CHEST - 2 VIEW COMPARISON:  12/12/2017 FINDINGS: Stable mild cardiac enlargement. There is mild aortic atherosclerosis without aneurysmal dilatation. Chronic left basilar atelectasis is noted. Blunting the posterior costophrenic angles may represent small posterior pleural effusions. Extensive chronic skeletal deformities are redemonstrated including multiple left-sided ribs, mid left clavicle, left scapula, healed displaced sternal fracture and widespread thoracic compression fractures with exaggerated thoracic kyphosis. No new findings are identified. IMPRESSION: 1. Chronic left basilar atelectasis. Probable small posterior pleural effusions month a posterior costophrenic angles. 2. Stable cardiomegaly with aortic atherosclerosis. 3. Extensive chronic skeletal deformities are redemonstrated including multiple left-sided ribs, mid left  clavicle, left scapula, healed displaced sternal fracture and widespread thoracic compression fractures with exaggerated thoracic kyphosis. Electronically Signed   By: Ashley Royalty M.D.   On: 12/30/2017 21:13   Dg Elbow Complete Left  Result Date: 01/26/2018 CLINICAL DATA:  77 year old female with fall and left elbow pain. EXAM: LEFT ELBOW - COMPLETE 3+ VIEW COMPARISON:  Left elbow radiograph dated 05/18/2012. FINDINGS: There is no acute fracture or dislocation. Wire and fixation pin of an old olecranon process fracture again noted similar to prior radiograph. No hardware fracture. There is mild degenerative changes. The bones are osteopenic. No joint effusion. The soft tissues appear unremarkable. IMPRESSION: No acute fracture or dislocation. Electronically Signed   By: Anner Crete M.D.   On: 01/26/2018 23:00   Ct Angio Chest Pe W And/or Wo Contrast  Result Date: 12/30/2017 CLINICAL DATA:  77 year old female with fever, cough, hypoxia. Multiple myeloma. EXAM: CT ANGIOGRAPHY CHEST WITH CONTRAST TECHNIQUE: Multidetector CT imaging of the chest was performed using the standard protocol during bolus administration of intravenous contrast. Multiplanar CT image reconstructions and MIPs were obtained to evaluate the vascular anatomy. CONTRAST:  175m ISOVUE-370 IOPAMIDOL (ISOVUE-370) INJECTION 76% COMPARISON:  Chest CTA 03/24/2016 FINDINGS: Cardiovascular: Good contrast bolus timing in the pulmonary arterial tree. No focal filling defect identified in the pulmonary arteries to suggest acute pulmonary embolism. Extensive calcified coronary artery atherosclerosis and/or stent. No pericardial effusion. Cardiac size at the upper limits of normal. Negative visible aorta aside from atherosclerosis. Mediastinum/Nodes: No lymphadenopathy. Lungs/Pleura: Small volume retained secretions in the bronchus intermedius. The other major airways are patent. There is bronchiectasis, maximal in the right upper lobe. Chronic  right apical consolidation/scarring is associated. There is left lower lobe posterior basal segment consolidation and confluent peribronchial opacity. Superimposed contralateral right lower lobe peribronchial patchy, tree-in-bud nodularity. Distal right lower lobe airway opacification. Mild associated consolidation. Mild bilateral middle lobe scarring appears stable. No pleural effusion. Upper Abdomen: Negative visible liver, spleen, pancreas, adrenal glands, kidneys, and bowel in the upper abdomen. Musculoskeletal: Osteopenia with scattered lytic lesions compatible with multiple myeloma. Chronic pathologic appearing fracture of the sternum. Numerous severe pathologic spinal compression fractures, severe T5 through T9 and T12. Only the T10 and L2 vertebral bodies appear to remain normal in the visible thoracic and lumbar spine. Chronic fracture deformity of the distal left clavicle and occasional ribs. Review of the MIP images confirms the above findings. IMPRESSION: 1.  Negative for acute pulmonary embolus. 2. Bilateral lower lobe Pneumonia/bronchopneumonia. No pleural effusion. 3. Underlying Bronchiectasis. 4. Chronic multiple myeloma with numerous fractures throughout the thorax. 5. Calcified coronary artery atherosclerosis. Electronically Signed   By: Genevie Ann M.D.   On: 12/30/2017 23:30   Ct Pelvis Wo Contrast  Result Date: 01/27/2018 CLINICAL DATA:  Pain after fall at home. Status post stem cell transplant August 2014 for multiple myeloma. Ongoing chemotherapy. EXAM: CT PELVIS WITHOUT CONTRAST TECHNIQUE: Multidetector CT imaging of the pelvis was performed following the standard protocol without intravenous contrast. COMPARISON:  Same day pelvic radiographs., pelvic CT from 01/24/2016 from bone marrow biopsy. FINDINGS: Urinary Tract: Intact urinary bladder without extravasation. No distal uropathy. Bowel: Nonobstructed nondistended small and large bowel loops noted within the pelvis. No mural hematoma.  Vascular/Lymphatic: Mild aortoiliac atherosclerosis. No lymphadenopathy. Reproductive:  No mass or other significant abnormality Other:  None Musculoskeletal: 1. Acute, comminuted left superior and inferior pubic rami fractures with slight caudal displacement involving the left superior pubic ramus fracture. Comminuted left inferior pubic ramus fracture is without significant displacement. 2. The partially included indwelling left femoral nail fixation hardware appears intact. No proximal femoral fracture. 3. Multiple lytic lesions of the included lower lumbar spine and bony pelvis consistent with known multiple myeloma. A lytic lesion involving L5 measures approximately 3 x 2 x 2 cm. Slightly expansile lytic lesion of the left iliac bone adjacent to the SI joint measures approximately 3.1 x 2.1 x 3.2 cm and is relatively stable. The fracture lucency seen through this lesion previously appears to have healed in the interim. Bilateral iliac crest lytic lucencies are noted on the left measuring 1.2 x 1.3 x 0.9 cm and on the right measuring 1.6 x 1.2 x 1.7 cm. A right iliac lytic lesion adjacent to the right SI joint measures 1.6 x 1.4 x 1.9 cm. Subcentimeter smaller scattered lytic foci are noted of the bony pelvis. There is lower lumbar facet arthropathy. IMPRESSION: 1. Redemonstration of multiple lytic lesions of the pelvis compatible with known multiple myeloma as above described. No pathologic fracture seen through these lesions. A subtle fracture lucency involving an expansile lytic lesion of the left iliac bone on prior bone marrow biopsy appears to have healed in the interim. 2. Acute comminuted left superior and inferior pubic rami fractures as above described. No fracture of the proximal femora nor hips. Electronically Signed   By: Ashley Royalty M.D.   On: 01/27/2018 00:59   Dg Chest Port 1 View  Result Date: 01/27/2018 CLINICAL DATA:  Cough and shortness of breath. EXAM: PORTABLE CHEST 1 VIEW COMPARISON:   PA and lateral 12/30/2017.  Chest and CT chest FINDINGS: Mild left basilar airspace disease is most compatible with atelectasis. The right lung is clear. No pneumothorax or pleural fluid. Heart size is normal. Aortic atherosclerosis is identified. Remote healed left clavicle fracture is seen. IMPRESSION: Subsegmental atelectasis left lung base. Atherosclerosis. Electronically Signed   By: Inge Rise M.D.   On: 01/27/2018 07:35   Dg Shoulder Left  Result Date: 01/26/2018 CLINICAL DATA:  77 year old female with fall and left shoulder pain. EXAM: LEFT SHOULDER - 2+ VIEW COMPARISON:  Left shoulder radiograph dated 06/01/2008 FINDINGS: There is no acute fracture. Old healed left clavicular and left rib fractures. There is slight inferior positioning of the humerus with widening of the acromial humeral distance. The head of the humerus however appears in alignment with the glenoid. This may be positional or represent joint effusion. If there  is clinical concern for shoulder dislocation additional images including Y-view and axial projection may provide better evaluation. The soft tissues are unremarkable. IMPRESSION: 1. No acute fracture. 2. Increased acromial humeral distance, likely positional. Clinical correlation is recommended to exclude shoulder dislocation. Electronically Signed   By: Anner Crete M.D.   On: 01/26/2018 23:04   Dg Hip Unilat With Pelvis 2-3 Views Left  Result Date: 01/26/2018 CLINICAL DATA:  Patient fell landing on left hip. Posterior hip pain with limited movement. EXAM: DG HIP (WITH OR WITHOUT PELVIS) 2-3V LEFT COMPARISON:  None. FINDINGS: Acute, closed left-sided superior and inferior pubic rami fractures with slight comminution and minimal displacement. Intact left femoral nail fixation of the femur. The bony pelvis and right hip appear intact otherwise. No pelvic diastasis is noted. There is lower lumbar degenerative disc and facet arthropathy. Mid to lower sacrum and coccyx  are obscured by bowel. IMPRESSION: Acute slightly comminuted and minimally displaced left superior and inferior pubic rami fractures. Electronically Signed   By: Ashley Royalty M.D.   On: 01/26/2018 22:14     Discharge Exam: Vitals:   01/27/18 1036 01/27/18 1341  BP: 120/73 (!) 111/57  Pulse: 89 87  Resp: 14 14  Temp: 98.2 F (36.8 C) 97.7 F (36.5 C)  SpO2: 97% 94%   Vitals:   01/27/18 0301 01/27/18 0614 01/27/18 1036 01/27/18 1341  BP: (!) 141/72 101/70 120/73 (!) 111/57  Pulse: 89 87 89 87  Resp: 16 15 14 14   Temp: 99.5 F (37.5 C) 99.1 F (37.3 C) 98.2 F (36.8 C) 97.7 F (36.5 C)  TempSrc: Oral Oral Oral Oral  SpO2: 95% 96% 97% 94%  Weight:      Height:        General: Pt is alert, awake, not in acute distress Cardiovascular: RRR, S1/S2 +, no rubs, no gallops Respiratory: CTA bilaterally, no wheezing, no rhonchi Abdominal: Soft, NT, ND, bowel sounds + Extremities: Good ROM, sensation and pulses intact.    The results of significant diagnostics from this hospitalization (including imaging, microbiology, ancillary and laboratory) are listed below for reference.     Microbiology: No results found for this or any previous visit (from the past 240 hour(s)).   Labs: BNP (last 3 results) Recent Labs    12/30/17 2009  BNP 250.0*   Basic Metabolic Panel: Recent Labs  Lab 01/22/18 1436 01/26/18 2349 01/27/18 0610  NA 138 140 137  K 4.0 3.7 4.0  CL 104 105 105  CO2 27 27 24   GLUCOSE 97 117* 106*  BUN 16 22 19   CREATININE 0.93 0.86 0.78  CALCIUM 9.5 9.5 8.8*   Liver Function Tests: Recent Labs  Lab 01/22/18 1436  AST 17  ALT 13  ALKPHOS 65  BILITOT 0.6  PROT 6.2*  ALBUMIN 3.3*   No results for input(s): LIPASE, AMYLASE in the last 168 hours. No results for input(s): AMMONIA in the last 168 hours. CBC: Recent Labs  Lab 01/22/18 1436 01/26/18 2349 01/27/18 0610  WBC 3.3* 4.8 4.5  NEUTROABS 1.9 3.7  --   HGB 9.9* 10.0* 9.3*  HCT 29.7* 30.0*  27.7*  MCV 102.1* 102.0* 101.5*  PLT 214 229 209   Cardiac Enzymes: No results for input(s): CKTOTAL, CKMB, CKMBINDEX, TROPONINI in the last 168 hours. BNP: Invalid input(s): POCBNP CBG: No results for input(s): GLUCAP in the last 168 hours. D-Dimer No results for input(s): DDIMER in the last 72 hours. Hgb A1c No results for input(s): HGBA1C in the  last 72 hours. Lipid Profile No results for input(s): CHOL, HDL, LDLCALC, TRIG, CHOLHDL, LDLDIRECT in the last 72 hours. Thyroid function studies No results for input(s): TSH, T4TOTAL, T3FREE, THYROIDAB in the last 72 hours.  Invalid input(s): FREET3 Anemia work up No results for input(s): VITAMINB12, FOLATE, FERRITIN, TIBC, IRON, RETICCTPCT in the last 72 hours. Urinalysis    Component Value Date/Time   COLORURINE YELLOW 12/30/2017 2214   APPEARANCEUR CLEAR 12/30/2017 2214   LABSPEC 1.018 12/30/2017 2214   LABSPEC 1.015 08/20/2012 1124   PHURINE 7.0 12/30/2017 2214   GLUCOSEU NEGATIVE 12/30/2017 2214   GLUCOSEU Negative 08/20/2012 1124   HGBUR NEGATIVE 12/30/2017 2214   HGBUR negative 10/22/2007 1336   BILIRUBINUR NEGATIVE 12/30/2017 2214   BILIRUBINUR Negative 08/20/2012 1124   KETONESUR 5 (A) 12/30/2017 2214   PROTEINUR NEGATIVE 12/30/2017 2214   UROBILINOGEN 0.2 09/14/2014 2100   UROBILINOGEN 0.2 08/20/2012 1124   NITRITE NEGATIVE 12/30/2017 2214   LEUKOCYTESUR NEGATIVE 12/30/2017 2214   LEUKOCYTESUR Small 08/20/2012 1124   Sepsis Labs Invalid input(s): PROCALCITONIN,  WBC,  LACTICIDVEN Microbiology No results found for this or any previous visit (from the past 240 hour(s)).   Time coordinating discharge: 35 minutes  SIGNED:  Chipper Oman, MD  [facility] 01/27/2018, 5:20 PM  Pager please text page via  [email or web address]  Note - This record has been created using [employer]. Chart creation errors have been sought, but may not always have been located. Such creation errors do not reflect on the standard  of medical care.

## 2018-01-27 NOTE — ED Notes (Signed)
ED TO INPATIENT HANDOFF REPORT  Name/Age/Gender Shannon Rush 77 y.o. female  Code Status Code Status History    Date Active Date Inactive Code Status Order ID Comments User Context   12/31/2017 0214 01/02/2018 1514 DNR [id number]  Bethena Roys, MD Inpatient   12/12/2017 1845 12/15/2017 1738 DNR [id number]  Mariel Aloe, MD Inpatient   03/10/2016 1341 03/29/2016 1928 Full Code [id number]  Caren Griffins, MD ED   09/14/2014 2344 09/19/2014 1820 Full Code [id number]  Toy Baker, MD Inpatient   08/09/2014 0106 08/13/2014 1925 Full Code [id number]  Ivor Costa, MD Inpatient    Questions for Most Recent Historical Code Status (Order [id number])    Question Answer Comment   In the event of cardiac or respiratory ARREST Do not call a "code blue"    In the event of cardiac or respiratory ARREST Do not perform Intubation, CPR, defibrillation or ACLS    In the event of cardiac or respiratory ARREST Use medication by any route, position, wound care, and other measures to relive pain and suffering. May use oxygen, suction and manual treatment of airway obstruction as needed for comfort.       Home/SNF/Other Home  Chief Complaint possible hip fracture  Level of Care/Admitting Diagnosis ED Disposition    ED Disposition Condition Comment   Admit  Hospital Area: [place] [[id number]]  Level of Care: Med-Surg [16]  Diagnosis: Closed fracture of multiple pubic rami [facility]) [2122482]  Admitting Physician: Rise Patience [phone]  Attending Physician: Rise Patience Lei.Right  PT Class (Do Not Modify): Observation [104]  PT Acc Code (Do Not Modify): Observation [10022]       Medical History Past Medical History:  Diagnosis Date  . CAD (coronary artery disease)    a. inf STEMI (in [place] >>> [facility]) >>> LHC (8/15- [place]):  pRCA 60%, mid to dist RCA 50% >>> med rx  . Cord compression ([place]) 07/13/12   MRI- diffuse myeloma involvement of  T-L spine  . History of radiation therapy 07/13/12-07/27/12   spinal cord compression T3-T10,left scapula  . Hx of echocardiogram    Echo (03/02/14-done at [facility]):  Normal LVEF without significant regional wall motion abnormalities. Mild   . Multiple myeloma ([place]) 07/01/2012  . OSTEOPOROSIS 06/11/2010   Multiple compression fractures; and spontaneous fracture of sternum Qualifier: Diagnosis of  By: [name], [name]     . Thoracic kyphosis 07/13/12   per MRI scan  . Unspecified deficiency anemia     Allergies Allergies  Allergen Reactions  . Zithromax [Azithromycin] Rash    Full body rash  . Zosyn [Piperacillin Sod-Tazobactam So] Swelling and Rash    Itching, rash and swelling of extremities Tolerates Rocephin  . Quinolones Rash    Cipro, Levaquin implicated Cipro also caused vomiting and abd pain    IV Location/Drains/Wounds Patient Lines/Drains/Airways Status   Active Line/Drains/Airways    Name:   Placement date:   Placement time:   Site:   Days:   Peripheral IV Right Antecubital   -    -    Antecubital             Labs/Imaging Results for orders placed or performed during the hospital encounter of 01/26/18 (from the past 48 hour(s))  CBC with Differential     Status: Abnormal   Collection Time: 01/26/18 11:49 PM  Result Value Ref Range   WBC 4.8 4.0 - 10.5 K/uL  RBC 2.94 (L) 3.87 - 5.11 MIL/uL   Hemoglobin 10.0 (L) 12.0 - 15.0 g/dL   HCT 30.0 (L) 36.0 - 46.0 %   MCV 102.0 (H) 78.0 - 100.0 fL   MCH 34.0 26.0 - 34.0 pg   MCHC 33.3 30.0 - 36.0 g/dL   RDW 15.8 (H) 11.5 - 15.5 %   Platelets 229 150 - 400 K/uL   Neutrophils Relative % 75 %   Neutro Abs 3.7 1.7 - 7.7 K/uL   Lymphocytes Relative 14 %   Lymphs Abs 0.7 0.7 - 4.0 K/uL   Monocytes Relative 8 %   Monocytes Absolute 0.4 0.1 - 1.0 K/uL   Eosinophils Relative 2 %   Eosinophils Absolute 0.1 0.0 - 0.7 K/uL   Basophils Relative 1 %   Basophils Absolute 0.0 0.0 - 0.1 K/uL     Comment: Performed at [facility], [place] [address], [place], [place] [address]  Basic metabolic panel     Status: Abnormal   Collection Time: 01/26/18 11:49 PM  Result Value Ref Range   Sodium 140 135 - 145 mmol/L   Potassium 3.7 3.5 - 5.1 mmol/L   Chloride 105 98 - 111 mmol/L    Comment: Please note change in reference range.   CO2 27 22 - 32 mmol/L   Glucose, Bld 117 (H) 70 - 99 mg/dL    Comment: Please note change in reference range.   BUN 22 8 - 23 mg/dL    Comment: Please note change in reference range.   Creatinine, Ser 0.86 0.44 - 1.00 mg/dL   Calcium 9.5 8.9 - 10.3 mg/dL   GFR calc non Af Amer >60 >60 mL/min   GFR calc Af Amer >60 >60 mL/min    Comment: (NOTE) The eGFR has been calculated using the CKD EPI equation. This calculation has not been validated in all clinical situations. eGFR's persistently <60 mL/min signify possible Chronic Kidney Disease.    Anion gap 8 5 - 15    Comment: Performed at [facility], [place] [address], [place], [place] [address]   *Note: Due to a large number of results and/or encounters for the requested time period, some results have not been displayed. A complete set of results can be found in Results Review.   Dg Elbow Complete Left  Result Date: 01/26/2018 CLINICAL DATA:  77 year old female with fall and left elbow pain. EXAM: LEFT ELBOW - COMPLETE 3+ VIEW COMPARISON:  Left elbow radiograph dated 05/18/2012. FINDINGS: There is no acute fracture or dislocation. Wire and fixation pin of an old olecranon process fracture again noted similar to prior radiograph. No hardware fracture. There is mild degenerative changes. The bones are osteopenic. No joint effusion. The soft tissues appear unremarkable. IMPRESSION: No acute fracture or dislocation. Electronically Signed   By: Anner Crete M.D.   On: 01/26/2018 23:00   Ct Pelvis Wo Contrast  Result Date: 01/27/2018 CLINICAL DATA:  Pain after fall at home.  Status post stem cell transplant August 2014 for multiple myeloma. Ongoing chemotherapy. EXAM: CT PELVIS WITHOUT CONTRAST TECHNIQUE: Multidetector CT imaging of the pelvis was performed following the standard protocol without intravenous contrast. COMPARISON:  Same day pelvic radiographs., pelvic CT from 01/24/2016 from bone marrow biopsy. FINDINGS: Urinary Tract: Intact urinary bladder without extravasation. No distal uropathy. Bowel: Nonobstructed nondistended small and large bowel loops noted within the pelvis. No mural hematoma. Vascular/Lymphatic: Mild aortoiliac atherosclerosis. No lymphadenopathy. Reproductive:  No mass or other significant abnormality Other:  None Musculoskeletal: 1. Acute, comminuted left superior and inferior pubic rami fractures with slight caudal displacement involving the left superior pubic ramus fracture. Comminuted left inferior pubic ramus fracture is without significant displacement. 2. The partially included indwelling left femoral nail fixation hardware appears intact. No proximal femoral fracture. 3. Multiple lytic lesions of the included lower lumbar spine and bony pelvis consistent with known multiple myeloma. A lytic lesion involving L5 measures approximately 3 x 2 x 2 cm. Slightly expansile lytic lesion of the left iliac bone adjacent to the SI joint measures approximately 3.1 x 2.1 x 3.2 cm and is relatively stable. The fracture lucency seen through this lesion previously appears to have healed in the interim. Bilateral iliac crest lytic lucencies are noted on the left measuring 1.2 x 1.3 x 0.9 cm and on the right measuring 1.6 x 1.2 x 1.7 cm. A right iliac lytic lesion adjacent to the right SI joint measures 1.6 x 1.4 x 1.9 cm. Subcentimeter smaller scattered lytic foci are noted of the bony pelvis. There is lower lumbar facet arthropathy. IMPRESSION: 1. Redemonstration of multiple lytic lesions of the pelvis compatible with known multiple myeloma as above described. No  pathologic fracture seen through these lesions. A subtle fracture lucency involving an expansile lytic lesion of the left iliac bone on prior bone marrow biopsy appears to have healed in the interim. 2. Acute comminuted left superior and inferior pubic rami fractures as above described. No fracture of the proximal femora nor hips. Electronically Signed   By: Ashley Royalty M.D.   On: 01/27/2018 00:59   Dg Shoulder Left  Result Date: 01/26/2018 CLINICAL DATA:  77 year old female with fall and left shoulder pain. EXAM: LEFT SHOULDER - 2+ VIEW COMPARISON:  Left shoulder radiograph dated 06/01/2008 FINDINGS: There is no acute fracture. Old healed left clavicular and left rib fractures. There is slight inferior positioning of the humerus with widening of the acromial humeral distance. The head of the humerus however appears in alignment with the glenoid. This may be positional or represent joint effusion. If there is clinical concern for shoulder dislocation additional images including Y-view and axial projection may provide better evaluation. The soft tissues are unremarkable. IMPRESSION: 1. No acute fracture. 2. Increased acromial humeral distance, likely positional. Clinical correlation is recommended to exclude shoulder dislocation. Electronically Signed   By: Anner Crete M.D.   On: 01/26/2018 23:04   Dg Hip Unilat With Pelvis 2-3 Views Left  Result Date: 01/26/2018 CLINICAL DATA:  Patient fell landing on left hip. Posterior hip pain with limited movement. EXAM: DG HIP (WITH OR WITHOUT PELVIS) 2-3V LEFT COMPARISON:  None. FINDINGS: Acute, closed left-sided superior and inferior pubic rami fractures with slight comminution and minimal displacement. Intact left femoral nail fixation of the femur. The bony pelvis and right hip appear intact otherwise. No pelvic diastasis is noted. There is lower lumbar degenerative disc and facet arthropathy. Mid to lower sacrum and coccyx are obscured by bowel. IMPRESSION:  Acute slightly comminuted and minimally displaced left superior and inferior pubic rami fractures. Electronically Signed   By: Ashley Royalty M.D.   On: 01/26/2018 22:14    Pending Labs Unresulted Labs (From admission, onward)   None      Vitals/Pain Today's Vitals   01/27/18 0000 01/27/18 0030 01/27/18 0100 01/27/18 0130  BP: 114/69 112/63 122/72 122/72  Pulse: 86 79 88 88  Resp: 16 18 17 17   Temp:      TempSrc:  SpO2: 94% 90% 99% 99%  Weight:      Height:      PainSc:        Isolation Precautions No active isolations  Medications Medications  morphine 4 MG/ML injection 4 mg (4 mg Intravenous Given 01/27/18 0035)    Mobility walks

## 2018-01-27 NOTE — Progress Notes (Signed)
Initial Nutrition Assessment  DOCUMENTATION CODES:   Underweight(Will assess for malnutrition at follow-up.)  INTERVENTION:  - Will order 10 AM and 2 PM snacks.  - Continue to encourage PO intakes.   NUTRITION DIAGNOSIS:   Increased nutrient needs related to catabolic illness, cancer and cancer related treatments, acute illness as evidenced by estimated needs.  GOAL:   Patient will meet greater than or equal to 90% of their needs  MONITOR:   PO intake, Weight trends, Labs, Skin  REASON FOR ASSESSMENT:   Malnutrition Screening Tool  ASSESSMENT:   77 y.o. female with history of multiple myeloma on chemotherapy, CAD, CHF, macrocytic anemia presents to the ER after patient had a fall.  Patient tripped on her dog's leash and fell onto the side.  Since the fall patient has been having pain on the left side.  Diet liberalized from Heart Healthy to Regular. Patient sleeping soundly at this time with no family/visitors present. Spoke with tech who cannot recall with certainty but feels that patient ate nearly all of breakfast, maybe around 80% or even all of breakfast. Flow sheet indicates 100% intake of the meal. Patient is known to this RD from admission nearly 1 month ago. At that time, patient had reported constant, underlying nausea, muted taste with all PO intakes regardless of how far out she was from a chemo treatment and that this had been ongoing x5 years, and that she does not like ONS.   Unable to perform NFPE but patient is slender in appearance. Per chart review, she has lost 11 lbs (9% body weight) in the past 2 months. This is significant for time frame. Highly suspect some degree of malnutrition based on weight loss, underweight BMI, and physical appearance.   Per H&P by Dr. Hal Hope early this AM: L-sided multiple pubic rami fracture with likely plan for conservative management, multiple myeloma followed by Dr. Burr Medico, microcytic anemia, compensated hx of CAD and CHF.     Medications reviewed; 1 tablet Oscal/day. Labs reviewed.       NUTRITION - FOCUSED PHYSICAL EXAM:  Unable to complete at this time and will attempt at follow-up.   Diet Order:   Diet Order           Diet regular Room service appropriate? Yes; Fluid consistency: Thin  Diet effective now          EDUCATION NEEDS:   No education needs have been identified at this time  Skin:  Skin Assessment: Reviewed RN Assessment  Last BM:  7/15  Height:   Ht Readings from Last 1 Encounters:  01/26/18 5' 6.5" (1.689 m)    Weight:   Wt Readings from Last 1 Encounters:  01/26/18 112 lb (50.8 kg)    Ideal Body Weight:  60.23 kg  BMI:  Body mass index is 17.81 kg/m.  Estimated Nutritional Needs:   Kcal:  1525-1780 (30-35 kcal/kg)  Protein:  76-86 grams (1.5-1.7 grams/kg)  Fluid:  >/= 1.7 L/day      Jarome Matin, MS, RD, LDN, [facility] Inpatient Clinical Dietitian Pager # [phone] After hours/weekend pager # [phone]

## 2018-01-27 NOTE — ED Notes (Signed)
Pt placed on 2L nasal cannula.

## 2018-01-27 NOTE — Progress Notes (Signed)
CSW consult- SNF placement.  Plan: [place], [place], MSW Clinical Social Worker  [phone] 01/27/2018  11:13 AM

## 2018-01-27 NOTE — ED Notes (Signed)
Patient transported to CT 

## 2018-01-27 NOTE — Progress Notes (Addendum)
Physical Therapy Treatment Patient Details Name: Shannon Rush MRN: [id number] DOB: 1940-09-09 Today's Date: 01/27/2018    History of Present Illness 77 y.o.femalewithhistory of multiple myeloma on chemotherapy, CAD, CHF, macrocytic anemia presents to the ER after patient had a fall on her L side. CT pelvis shows left superior and inferior comminuted pubic rami fracture. X-rays of the left elbow was unremarkable but x-ray of the left shoulder shows slight increase in acromio humeral distance. pt admitted for further pain management.    PT Comments    Assisted patient with bed mobility, transfers, ambulation, and stairs. Patient able to go supevision to sit EOB with supervision and increased time. From sit to stand, patient able to rise without physical assist, with extra time to steady balance. Initially, started ambulation with 4 wheel walker. Patient had significantly decreased gait speed and was apprehensive. Switched to RW and confidence increased along with gait speed and stride length. Patient was able to go up/dn 4 steps with bilateral rails, min guard. Patient was steady during ambulation with increased time due to antalgic gait. Notified RN of mobility status and that patient will need a 3-1.   Follow Up Recommendations  Home health PT     Equipment Recommendations  3-1   Recommendations for Other Services       Precautions / Restrictions Precautions Precautions: Fall Restrictions Weight Bearing Restrictions: Yes LLE Weight Bearing: Weight bearing as tolerated    Mobility  Bed Mobility Overal bed mobility: Needs Assistance Bed Mobility: Supine to Sit     Supine to sit: Modified independent (Device/Increase time)     General bed mobility comments: decreased time; did not use handrails  Transfers Overall transfer level: Needs assistance Equipment used: Rolling walker (2 wheeled);4-wheeled walker Transfers: Sit to/from Stand Sit to Stand: Min guard Stand  pivot transfers: Min assist       General transfer comment: increased time to steady self once standing  Ambulation/Gait Ambulation/Gait assistance: Min guard Gait Distance (Feet): 100 Feet Assistive device: Rolling walker (2 wheeled);4-wheeled walker Gait Pattern/deviations: Step-through pattern;Antalgic;Decreased stride length;Decreased stance time - left Gait velocity: decreased   General Gait Details: Patient used 4 wheel walker for first 30 feet; was apprehensive with decreased speed and stride length; switched to RW and was able to increase gait speed and stride length   Stairs Stairs: Yes Stairs assistance: Min guard;+2 safety/equipment Stair Management: Two rails;Step to pattern Number of Stairs: 4     Wheelchair Mobility    Modified Rankin (Stroke Patients Only)       Balance Overall balance assessment: Needs assistance Sitting-balance support: Feet supported Sitting balance-Leahy Scale: Good     Standing balance support: Bilateral upper extremity supported;During functional activity Standing balance-Leahy Scale: Fair                              Cognition Arousal/Alertness: Awake/alert Behavior During Therapy: WFL for tasks assessed/performed Overall Cognitive Status: Within Functional Limits for tasks assessed                                        Exercises      General Comments        Pertinent Vitals/Pain Pain Assessment: Faces Faces Pain Scale: Hurts even more Pain Location: L hip/pelvis Pain Descriptors / Indicators: Grimacing;Guarding Pain Intervention(s): Ice applied;Monitored during session;RN gave pain meds during  session    Home Living Family/patient expects to be discharged to:: Private residence Living Arrangements: Spouse/significant other Available Help at Discharge: Family Type of Home: House Home Access: Stairs to enter   Home Layout: Two level;Able to live on main level with  bedroom/bathroom Home Equipment: Walker - 4 wheels      Prior Function Level of Independence: Independent          PT Goals (current goals can now be found in the care plan section) Acute Rehab PT Goals Patient Stated Goal: return home  PT Goal Formulation: With patient/family Time For Goal Achievement: 02/03/18 Potential to Achieve Goals: Good Progress towards PT goals: Progressing toward goals    Frequency    Min 5X/week      PT Plan Current plan remains appropriate    Co-evaluation PT/OT/SLP Co-Evaluation/Treatment: Yes Reason for Co-Treatment: For patient/therapist safety PT goals addressed during session: Mobility/safety with mobility OT goals addressed during session: ADL's and self-care;Proper use of Adaptive equipment and DME      AM-PAC PT "6 Clicks" Daily Activity  Outcome Measure  Difficulty turning over in bed (including adjusting bedclothes, sheets and blankets)?: A Lot Difficulty moving from lying on back to sitting on the side of the bed? : A Lot Difficulty sitting down on and standing up from a chair with arms (e.g., wheelchair, bedside commode, etc,.)?: A Lot Help needed moving to and from a bed to chair (including a wheelchair)?: A Lot Help needed walking in hospital room?: Total Help needed climbing 3-5 steps with a railing? : Total 6 Click Score: 10    End of Session Equipment Utilized During Treatment: Gait belt Activity Tolerance: Patient tolerated treatment well Patient left: in chair;with call bell/phone within reach;with chair alarm set Nurse Communication: Mobility status;Other (comment)(Notifed RN that pt. needed a 3-1 ordered) PT Visit Diagnosis: Unsteadiness on feet (R26.81)     Time: 1530-1600 PT Time Calculation (min) (ACUTE ONLY): 30 min  Charges:  $Gait Training: 8-22 mins $Therapeutic Activity: 8-22 mins                    G Codes:      Rachel Bo, SPTA 01/27/2018, 4:11 PM   Rica Koyanagi  PTA [facility]  Acute  Rehab Pager       [phone]   Direct Supervision during session and agree with above

## 2018-01-27 NOTE — ED Provider Notes (Signed)
[place] DEPT Provider Note   CSN: [id number] Arrival date & time: 01/26/18  2139     History   Chief Complaint Chief Complaint  Patient presents with  . Fall    HPI Shannon Rush is a 77 y.o. female.  The history is provided by the patient and medical records.  Fall     77 y.o. F with hx of CAD, MM with multiple areas of metastases currently on Dara chemotherapy, anemia, osteoporosis, presenting to the ED following a mechanical fall.  Patient reports she was outside playing with her dogs when she accidentally stepped on their leash and when they took off running it knocked her down to the ground on her left side.  No head injury or LOC.  States she was not able to get up off the ground after her fall.  She has no significant pain in her left hip, has had prior surgery on this hip before.  She also has some pain in her left elbow and shoulder.  She denies any numbness or weakness of her extremities.  States prior to the fall she was actually feeling very well.  She has not had any dizziness, weakness, or feelings of syncope.  She generally ambulates around without assistance, even rides her bicycle regularly.  Past Medical History:  Diagnosis Date  . CAD (coronary artery disease)    a. inf STEMI (in [place] >>> [facility]) >>> LHC (8/15- [place]):  pRCA 60%, mid to dist RCA 50% >>> med rx  . Cord compression ([place]) 07/13/12   MRI- diffuse myeloma involvement of T-L spine  . History of radiation therapy 07/13/12-07/27/12   spinal cord compression T3-T10,left scapula  . Hx of echocardiogram    Echo (03/02/14-done at [facility]):  Normal LVEF without significant regional wall motion abnormalities. Mild   . Multiple myeloma ([place]) 07/01/2012  . OSTEOPOROSIS 06/11/2010   Multiple compression fractures; and spontaneous fracture of sternum Qualifier: Diagnosis of  By: [name], [name]     . Thoracic kyphosis  07/13/12   per MRI scan  . Unspecified deficiency anemia     Patient Active Problem List   Diagnosis Date Noted  . Port-A-Cath in place 11/21/2017  . Goals of care, counseling/discussion 02/28/2017  . Non-ischemic cardiomyopathy ([place]) 12/04/2016  . Antineoplastic chemotherapy induced anemia 03/28/2016  . Diastolic dysfunction-grade 2 03/27/2016  . Patient on antineoplastic chemotherapy regimen   . Adrenocortical insufficiency ([place])   . Drug-induced neutropenia ([place]) 03/11/2016  . HCAP (healthcare-associated pneumonia) 03/10/2016  . Corn of foot 11/04/2014  . Protein-calorie malnutrition ([place]) 09/23/2014  . Pancytopenia ([place]) 08/10/2014  . CAD -50-60% RCA Aug 2015 03/11/2014  . History of STEMI-Aug 2015- secondary to thrombotic state from chemo 03/10/2014  . Unspecified vitamin D deficiency 05/05/2013  . History of peripheral stem cell transplant ([place]) 03/05/2013  . History of organ or tissue transplant 03/05/2013  . Swelling, mass, or lump in chest 12/24/2012  . Multiple myeloma ([place]) 07/01/2012  . Sciatica of left side 06/03/2011  . Osteoporosis 06/11/2010  . Anemia, macrocytic 06/02/2009  . DJD, UNSPECIFIED 09/11/2006    Past Surgical History:  Procedure Laterality Date  . APPENDECTOMY    . CESAREAN SECTION     x2   . COLONOSCOPY  2007   neg with Dr. [name]  . ELBOW SURGERY    . HIP SURGERY  2009   left  . TUBAL LIGATION  OB History   None      Home Medications    Prior to Admission medications   Medication Sig Start Date End Date Taking? Authorizing Provider  acetaminophen (TYLENOL) 500 MG tablet Take 500 mg by mouth every 6 (six) hours as needed for moderate pain or fever.   Yes [provider]  acyclovir (ZOVIRAX) 400 MG tablet Take 1 tablet (400 mg total) by mouth 2 (two) times daily. 04/24/17  Yes [name], MD  atorvastatin (LIPITOR) 20 MG tablet Take 1 tablet (20 mg total) by mouth daily. 08/06/17 08/01/18 Yes [name], MD  B  Complex-C (SUPER B COMPLEX PO) Take 1 tablet by mouth daily.   Yes [provider]  calcium carbonate (OS-CAL) 600 MG TABS tablet Take 600 mg by mouth daily.   Yes [provider]  dexamethasone (DECADRON) 4 MG tablet Take 5 tab the day before chemo, every 2 weeks 09/26/17  Yes [name], MD  metoprolol succinate (TOPROL-XL) 25 MG 24 hr tablet Take 1 tablet (25 mg total) by mouth daily. 12/12/17  Yes [name], MD  Multiple Vitamin (MULTIVITAMIN WITH MINERALS) TABS tablet Take 1 tablet by mouth daily. 01/02/18  Yes Lavina Hamman, MD  fluticasone (FLONASE) 50 MCG/ACT nasal spray Place 2 sprays into both nostrils daily. Patient not taking: Reported on 01/27/2018 02/15/16   Lorna Few, DO    Family History Family History  Problem Relation Age of Onset  . Glaucoma Mother   . Heart disease Mother   . Peripheral vascular disease Mother   . Raynaud syndrome Mother   . Heart disease Father   . Heart failure Father   . Glaucoma Father   . Cancer Brother        stage IV prostate CA  . Raynaud syndrome Daughter   . Raynaud syndrome Daughter     Social History Social History   Tobacco Use  . Smoking status: Never Smoker  . Smokeless tobacco: Never Used  Substance Use Topics  . Alcohol use: Yes    Alcohol/week: 4.2 oz    Types: 7 Standard drinks or equivalent per week    Comment: 1 glass of wine or beer 3-5 days per week  . Drug use: No     Allergies   Zithromax [azithromycin]; Zosyn [piperacillin sod-tazobactam so]; and Quinolones   Review of Systems Review of Systems  Musculoskeletal: Positive for arthralgias.  All other systems reviewed and are negative.    Physical Exam Updated Vital Signs BP 122/72   Pulse 88   Temp 98.4 F (36.9 C) (Oral)   Resp 17   Ht 5' 6.5" (1.689 m)   Wt 50.8 kg (112 lb)   SpO2 99%   BMI 17.81 kg/m   Physical Exam  Constitutional: She is oriented to person, place, and time. She appears well-developed and  well-nourished.  Elderly, appears very well  HENT:  Head: Normocephalic and atraumatic.  Mouth/Throat: Oropharynx is clear and moist.  Eyes: Pupils are equal, round, and reactive to light. Conjunctivae and EOM are normal.  Neck: Normal range of motion.  Cardiovascular: Normal rate, regular rhythm and normal heart sounds.  Pulmonary/Chest: Effort normal and breath sounds normal. No stridor. No respiratory distress. She has no wheezes. She has no rhonchi.  Left clavicle deformity which is chronic  Abdominal: Soft. Bowel sounds are normal. There is no tenderness. There is no rebound.  Musculoskeletal: Normal range of motion.  Tenderness of left pelvis; no leg shortening,  left hip appears stable Bruising and some tenderness of left medial elbow; able to flex/extend-- some pain noted Left shoulder sore but overall non-tender; no gross deformities; full ROM maintained Normal grip strength bilaterally All 4 extremities are neurovascularly intact  Neurological: She is alert and oriented to person, place, and time.  AAOx3, answering questions and following commands appropriately; no apparent strength or sensory deficit noted; speech is clear and goal oriented  Skin: Skin is warm and dry.  Psychiatric: She has a normal mood and affect.  Nursing note and vitals reviewed.    ED Treatments / Results  Labs (all labs ordered are listed, but only abnormal results are displayed) Labs Reviewed  CBC WITH DIFFERENTIAL/PLATELET - Abnormal; Notable for the following components:      Result Value   RBC 2.94 (*)    Hemoglobin 10.0 (*)    HCT 30.0 (*)    MCV 102.0 (*)    RDW 15.8 (*)    All other components within normal limits  BASIC METABOLIC PANEL - Abnormal; Notable for the following components:   Glucose, Bld 117 (*)    All other components within normal limits    EKG None  Radiology Dg Elbow Complete Left  Result Date: 01/26/2018 CLINICAL DATA:  77 year old female with fall and left  elbow pain. EXAM: LEFT ELBOW - COMPLETE 3+ VIEW COMPARISON:  Left elbow radiograph dated 05/18/2012. FINDINGS: There is no acute fracture or dislocation. Wire and fixation pin of an old olecranon process fracture again noted similar to prior radiograph. No hardware fracture. There is mild degenerative changes. The bones are osteopenic. No joint effusion. The soft tissues appear unremarkable. IMPRESSION: No acute fracture or dislocation. Electronically Signed   By: Anner Crete M.D.   On: 01/26/2018 23:00   Ct Pelvis Wo Contrast  Result Date: 01/27/2018 CLINICAL DATA:  Pain after fall at home. Status post stem cell transplant August 2014 for multiple myeloma. Ongoing chemotherapy. EXAM: CT PELVIS WITHOUT CONTRAST TECHNIQUE: Multidetector CT imaging of the pelvis was performed following the standard protocol without intravenous contrast. COMPARISON:  Same day pelvic radiographs., pelvic CT from 01/24/2016 from bone marrow biopsy. FINDINGS: Urinary Tract: Intact urinary bladder without extravasation. No distal uropathy. Bowel: Nonobstructed nondistended small and large bowel loops noted within the pelvis. No mural hematoma. Vascular/Lymphatic: Mild aortoiliac atherosclerosis. No lymphadenopathy. Reproductive:  No mass or other significant abnormality Other:  None Musculoskeletal: 1. Acute, comminuted left superior and inferior pubic rami fractures with slight caudal displacement involving the left superior pubic ramus fracture. Comminuted left inferior pubic ramus fracture is without significant displacement. 2. The partially included indwelling left femoral nail fixation hardware appears intact. No proximal femoral fracture. 3. Multiple lytic lesions of the included lower lumbar spine and bony pelvis consistent with known multiple myeloma. A lytic lesion involving L5 measures approximately 3 x 2 x 2 cm. Slightly expansile lytic lesion of the left iliac bone adjacent to the SI joint measures approximately 3.1 x  2.1 x 3.2 cm and is relatively stable. The fracture lucency seen through this lesion previously appears to have healed in the interim. Bilateral iliac crest lytic lucencies are noted on the left measuring 1.2 x 1.3 x 0.9 cm and on the right measuring 1.6 x 1.2 x 1.7 cm. A right iliac lytic lesion adjacent to the right SI joint measures 1.6 x 1.4 x 1.9 cm. Subcentimeter smaller scattered lytic foci are noted of the bony pelvis. There is lower lumbar facet arthropathy. IMPRESSION: 1. Redemonstration of  multiple lytic lesions of the pelvis compatible with known multiple myeloma as above described. No pathologic fracture seen through these lesions. A subtle fracture lucency involving an expansile lytic lesion of the left iliac bone on prior bone marrow biopsy appears to have healed in the interim. 2. Acute comminuted left superior and inferior pubic rami fractures as above described. No fracture of the proximal femora nor hips. Electronically Signed   By: Ashley Royalty M.D.   On: 01/27/2018 00:59   Dg Shoulder Left  Result Date: 01/26/2018 CLINICAL DATA:  77 year old female with fall and left shoulder pain. EXAM: LEFT SHOULDER - 2+ VIEW COMPARISON:  Left shoulder radiograph dated 06/01/2008 FINDINGS: There is no acute fracture. Old healed left clavicular and left rib fractures. There is slight inferior positioning of the humerus with widening of the acromial humeral distance. The head of the humerus however appears in alignment with the glenoid. This may be positional or represent joint effusion. If there is clinical concern for shoulder dislocation additional images including Y-view and axial projection may provide better evaluation. The soft tissues are unremarkable. IMPRESSION: 1. No acute fracture. 2. Increased acromial humeral distance, likely positional. Clinical correlation is recommended to exclude shoulder dislocation. Electronically Signed   By: Anner Crete M.D.   On: 01/26/2018 23:04   Dg Hip Unilat  With Pelvis 2-3 Views Left  Result Date: 01/26/2018 CLINICAL DATA:  Patient fell landing on left hip. Posterior hip pain with limited movement. EXAM: DG HIP (WITH OR WITHOUT PELVIS) 2-3V LEFT COMPARISON:  None. FINDINGS: Acute, closed left-sided superior and inferior pubic rami fractures with slight comminution and minimal displacement. Intact left femoral nail fixation of the femur. The bony pelvis and right hip appear intact otherwise. No pelvic diastasis is noted. There is lower lumbar degenerative disc and facet arthropathy. Mid to lower sacrum and coccyx are obscured by bowel. IMPRESSION: Acute slightly comminuted and minimally displaced left superior and inferior pubic rami fractures. Electronically Signed   By: Ashley Royalty M.D.   On: 01/26/2018 22:14    Procedures Procedures (including critical care time)  Medications Ordered in ED Medications  morphine 4 MG/ML injection 4 mg (4 mg Intravenous Given 01/27/18 0035)     Initial Impression / Assessment and Plan / ED Course  I have reviewed the triage vital signs and the nursing notes.  Pertinent labs & imaging results that were available during my care of the patient were reviewed by me and considered in my medical decision making (see chart for details).  78 year old female here after a mechanical fall, she was playing with her dogs and got tripped up in the leash.  Fell onto left side, no head injury or loss of consciousness.  She reports pain mostly in her left hip/pelvis but also has some pain in the left elbow and shoulder.  No significant deformities noted on exam.  No leg shortening.  X-rays obtained, does have evidence of superior and inferior pubic rami fractures.  Inferior fracture appears comminuted, CT was obtained to ensure no other subtle injuries.  After medications here, patient barely able to sit up in the bed without significant pain.  She generally is very active, riding her bicycle on a daily basis, walks unassisted.  I do  feel she would be a good candidate for PT/OT.  Discussed with Dr. Hal Hope-- will admit for ongoing care.  Final Clinical Impressions(s) / ED Diagnoses   Final diagnoses:  Fall, initial encounter  Closed fracture of multiple rami of left  pubis, initial encounter [facility])  Multiple myeloma not having achieved remission [facility])    ED Discharge Orders    None       Larene Pickett, PA-C 01/27/18 0252    Duffy Bruce, MD 01/28/18 [phone]

## 2018-01-27 NOTE — Evaluation (Addendum)
Occupational Therapy Evaluation Patient Details Name: Shannon Rush MRN: [id number] DOB: January 11, 1941 Today's Date: 01/27/2018    History of Present Illness 77 y.o.femalewithhistory of multiple myeloma on chemotherapy, CAD, CHF, macrocytic anemia presents to the ER after patient had a fall on her L side. CT pelvis shows left superior and inferior comminuted pubic rami fracture. X-rays of the left elbow was unremarkable but x-ray of the left shoulder shows slight increase in acromio humeral distance. pt admitted for further pain management.   Clinical Impression   This 77 y/o female presents with the above. At baseline is independent with ADLs, iADLs and functional mobility. Pt demonstrating room level functional mobility this session with overall minA using RW. Currently requires minA for toileting ADLs, min-modA for LB ADLs. Pt lives with spouse who she reports is able to assist with ADLs PRN after return home. Will benefit from continued OT services while in acute setting to maximize her safety and independence with ADLs and mobility.    Follow Up Recommendations  Supervision/Assistance - 24 hour    Equipment Recommendations  3 in 1 bedside commode           Precautions / Restrictions Precautions Precautions: Fall Restrictions Weight Bearing Restrictions: Yes LLE Weight Bearing: Weight bearing as tolerated      Mobility Bed Mobility Overal bed mobility: Needs Assistance Bed Mobility: Supine to Sit     Supine to sit: Min guard     General bed mobility comments: increased time and effort, no physical assist needed, close guard for safety   Transfers Overall transfer level: Needs assistance Equipment used: Rolling walker (2 wheeled) Transfers: Sit to/from [facility] Sit to Stand: Min guard;Min assist Stand pivot transfers: Min assist       General transfer comment: light minA to rise and steady at RW, progressed to minguard; stood from EOB and BSC;  min steadying assist to pivot to [facility] using RW    Balance Overall balance assessment: Needs assistance Sitting-balance support: Feet supported Sitting balance-Leahy Scale: Good     Standing balance support: Bilateral upper extremity supported;During functional activity Standing balance-Leahy Scale: Fair                             ADL either performed or assessed with clinical judgement   ADL Overall ADL's : Needs assistance/impaired Eating/Feeding: Independent;Sitting   Grooming: Set up;Sitting   Upper Body Bathing: Min guard;Set up;Sitting   Lower Body Bathing: Sit to/from stand;Minimal assistance   Upper Body Dressing : Set up;Sitting   Lower Body Dressing: Moderate assistance Lower Body Dressing Details (indicate cue type and reason): educated on donning LLE first  Toilet Transfer: Minimal assistance;Min guard;Stand-pivot;BSC;RW   Toileting- Clothing Manipulation and Hygiene: Min guard;Minimal assistance;Sit to/from stand       Functional mobility during ADLs: Minimal assistance;Rolling walker General ADL Comments: pt completing stand pivot transfer to [facility], completing additional room level ambulation to recliner, doorway and back with overall minA using RW.                          Pertinent Vitals/Pain Pain Assessment: Faces Faces Pain Scale: Hurts even more Pain Location: L hip/pelvis Pain Descriptors / Indicators: Grimacing;Guarding Pain Intervention(s): RN gave pain meds during session;Monitored during session;Repositioned     Hand Dominance     Extremity/Trunk Assessment Upper Extremity Assessment Upper Extremity Assessment: LUE deficits/detail LUE Deficits / Details: decreased shoulder ROM (  approx 0-90* flexion) but pt reports no increase in pain with wt bearing through LUE   Lower Extremity Assessment Lower Extremity Assessment: Defer to PT evaluation   Cervical / Trunk Assessment Cervical / Trunk Assessment: Kyphotic    Communication Communication Communication: No difficulties   Cognition Arousal/Alertness: Awake/alert Behavior During Therapy: WFL for tasks assessed/performed Overall Cognitive Status: Within Functional Limits for tasks assessed                                                      Home Living Family/patient expects to be discharged to:: Private residence Living Arrangements: Spouse/significant other Available Help at Discharge: Family Type of Home: House Home Access: Stairs to enter Technical brewer of Steps: 4   Home Layout: Two level;Able to live on main level with bedroom/bathroom     Bathroom Shower/Tub: Walk-in shower   Bathroom Toilet: Handicapped height     Home Equipment: Walker - 4 wheels          Prior Functioning/Environment Level of Independence: Independent                 OT Problem List: Decreased strength;Impaired balance (sitting and/or standing);Pain;Decreased range of motion;Decreased activity tolerance;Decreased knowledge of use of DME or AE      OT Treatment/Interventions: Self-care/ADL training;DME and/or AE instruction;Therapeutic exercise;Balance training;Therapeutic activities;Patient/family education    OT Goals(Current goals can be found in the care plan section) Acute Rehab OT Goals Patient Stated Goal: return home  OT Goal Formulation: With patient Time For Goal Achievement: 02/10/18 Potential to Achieve Goals: Good  OT Frequency: Min 2X/week   Barriers to D/C:            Co-evaluation PT/OT/SLP Co-Evaluation/Treatment: Yes Reason for Co-Treatment: For patient/therapist safety;To address functional/ADL transfers   OT goals addressed during session: ADL's and self-care;Proper use of Adaptive equipment and DME      AM-PAC PT "6 Clicks" Daily Activity     Outcome Measure Help from another person eating meals?: None Help from another person taking care of personal grooming?: A Little Help from  another person toileting, which includes using toliet, bedpan, or urinal?: A Little Help from another person bathing (including washing, rinsing, drying)?: A Little Help from another person to put on and taking off regular upper body clothing?: None Help from another person to put on and taking off regular lower body clothing?: A Lot 6 Click Score: 19   End of Session Equipment Utilized During Treatment: Rolling walker Nurse Communication: Mobility status  Activity Tolerance: Patient tolerated treatment well Patient left: in chair;with call bell/phone within reach;with chair alarm set;with family/visitor present  OT Visit Diagnosis: Other abnormalities of gait and mobility (R26.89);Pain Pain - Right/Left: Left Pain - part of body: Hip(pelvis)                Time: 1030-1105 OT Time Calculation (min): 35 min Charges:  OT General Charges $OT Visit: 1 Visit OT Evaluation $OT Eval Moderate Complexity: 1 Mod G-Codes:     Lou Cal, OT Pager [phone] 01/27/2018   Shannon Rush 01/27/2018, 12:50 PM

## 2018-01-27 NOTE — Telephone Encounter (Addendum)
Per Dr. Burr Medico okay for patient to receive daratumumab on 7/18.

## 2018-01-27 NOTE — Evaluation (Signed)
Physical Therapy Evaluation Patient Details Name: Shannon Rush MRN: [id number] DOB: 01/23/1941 Today's Date: 01/27/2018   History of Present Illness  77 y.o.femalewithhistory of multiple myeloma on chemotherapy, CAD, CHF, macrocytic anemia presents to the ER after patient had a fall on her L side. CT pelvis shows left superior and inferior comminuted pubic rami fracture. X-rays of the left elbow was unremarkable but x-ray of the left shoulder shows slight increase in acromio humeral distance. pt admitted for further pain management.  Clinical Impression  The patient did mobilize and ambulate a short distance. Patient declines rehab /SNF. The patient does have a challenge of an incline/[place] to get to door. May need a WC if incline is too great. Will continue to assess. May be able to drive to near steps per spouse. Pt admitted with above diagnosis. Pt currently with functional limitations due to the deficits listed below (see PT Problem List).* Pt will benefit from skilled PT to increase their independence and safety with mobility to allow discharge to the venue listed below.   .     Follow Up Recommendations Home health PT    Equipment Recommendations  Rolling walker with 5" wheels;Wheelchair (measurements PT)(will try 4 wheeled)    Recommendations for Other Services       Precautions / Restrictions Precautions Precautions: Fall Restrictions Weight Bearing Restrictions: Yes LLE Weight Bearing: Weight bearing as tolerated      Mobility  Bed Mobility Overal bed mobility: Needs Assistance Bed Mobility: Supine to Sit     Supine to sit: Min guard     General bed mobility comments: increased time and effort, no physical assist needed, close guard for safety   Transfers Overall transfer level: Needs assistance Equipment used: Rolling walker (2 wheeled) Transfers: Sit to/from [facility] Sit to Stand: Min guard;Min assist Stand pivot transfers: Min assist        General transfer comment: light minA to rise and steady at RW, progressed to minguard; stood from EOB and BSC; min steadying assist to pivot to [facility] using RW  Ambulation/Gait Ambulation/Gait assistance: Min Web designer (Feet): 20 Feet Assistive device: Rolling walker (2 wheeled) Gait Pattern/deviations: Step-to pattern;Antalgic     General Gait Details: toe touch on left, did advance the leg.   Stairs            Wheelchair Mobility    Modified Rankin (Stroke Patients Only)       Balance Overall balance assessment: Needs assistance Sitting-balance support: Feet supported Sitting balance-Leahy Scale: Good     Standing balance support: Bilateral upper extremity supported;During functional activity Standing balance-Leahy Scale: Fair                               Pertinent Vitals/Pain Pain Assessment: Faces Faces Pain Scale: Hurts even more Pain Location: L hip/pelvis Pain Descriptors / Indicators: Grimacing;Guarding Pain Intervention(s): Ice applied;Monitored during session;RN gave pain meds during session    Home Living Family/patient expects to be discharged to:: Private residence Living Arrangements: Spouse/significant other Available Help at Discharge: Family Type of Home: House Home Access: Stairs to enter   Technical brewer of Steps: 4 Home Layout: Two level;Able to live on main level with bedroom/bathroom Home Equipment: Walker - 4 wheels      Prior Function Level of Independence: Independent               Hand Dominance  Extremity/Trunk Assessment   Upper Extremity Assessment Upper Extremity Assessment: Defer to OT evaluation LUE Deficits / Details: decreased shoulder ROM (approx 0-90* flexion) but pt reports no increase in pain with wt bearing through LUE    Lower Extremity Assessment Lower Extremity Assessment: LLE deficits/detail LLE Deficits Details: minimal weight on left, toe touch, did  advance the leg    Cervical / Trunk Assessment Cervical / Trunk Assessment: Kyphotic  Communication   Communication: No difficulties  Cognition Arousal/Alertness: Awake/alert Behavior During Therapy: WFL for tasks assessed/performed Overall Cognitive Status: Within Functional Limits for tasks assessed                                        General Comments      Exercises     Assessment/Plan    PT Assessment Patient needs continued PT services  PT Problem List Decreased strength;Decreased range of motion;Decreased activity tolerance;Decreased mobility;Decreased knowledge of precautions;Decreased safety awareness;Decreased knowledge of use of DME;Pain       PT Treatment Interventions DME instruction;Therapeutic exercise;Gait training;Stair training;Functional mobility training;Therapeutic activities;Patient/family education    PT Goals (Current goals can be found in the Care Plan section)  Acute Rehab PT Goals Patient Stated Goal: return home  PT Goal Formulation: With patient/family Time For Goal Achievement: 02/03/18 Potential to Achieve Goals: Good    Frequency Min 5X/week   Barriers to discharge        Co-evaluation PT/OT/SLP Co-Evaluation/Treatment: Yes Reason for Co-Treatment: For patient/therapist safety PT goals addressed during session: Mobility/safety with mobility OT goals addressed during session: ADL's and self-care;Proper use of Adaptive equipment and DME       AM-PAC PT "6 Clicks" Daily Activity  Outcome Measure Difficulty turning over in bed (including adjusting bedclothes, sheets and blankets)?: A Lot Difficulty moving from lying on back to sitting on the side of the bed? : A Lot Difficulty sitting down on and standing up from a chair with arms (e.g., wheelchair, bedside commode, etc,.)?: A Lot Help needed moving to and from a bed to chair (including a wheelchair)?: A Lot Help needed walking in hospital room?: Total Help needed  climbing 3-5 steps with a railing? : Total 6 Click Score: 10    End of Session   Activity Tolerance: Patient tolerated treatment well Patient left: in chair;with call bell/phone within reach;with family/visitor present;with chair alarm set   PT Visit Diagnosis: Unsteadiness on feet (R26.81)    Time: 2548-3234 PT Time Calculation (min) (ACUTE ONLY): 33 min   Charges:   PT Evaluation $PT Eval Low Complexity: 1 Low     PT G CodesTresa Endo PT [phone]   Claretha Cooper 01/27/2018, 2:12 PM

## 2018-01-29 ENCOUNTER — Inpatient Hospital Stay: Payer: Medicare Other

## 2018-01-29 VITALS — BP 97/57 | HR 77 | Temp 98.0°F | Resp 16

## 2018-01-29 DIAGNOSIS — Z5112 Encounter for antineoplastic immunotherapy: Secondary | ICD-10-CM | POA: Diagnosis not present

## 2018-01-29 DIAGNOSIS — Z9221 Personal history of antineoplastic chemotherapy: Secondary | ICD-10-CM | POA: Diagnosis not present

## 2018-01-29 DIAGNOSIS — Z9481 Bone marrow transplant status: Secondary | ICD-10-CM | POA: Diagnosis not present

## 2018-01-29 DIAGNOSIS — I509 Heart failure, unspecified: Secondary | ICD-10-CM | POA: Diagnosis not present

## 2018-01-29 DIAGNOSIS — Z923 Personal history of irradiation: Secondary | ICD-10-CM | POA: Diagnosis not present

## 2018-01-29 DIAGNOSIS — C9002 Multiple myeloma in relapse: Secondary | ICD-10-CM

## 2018-01-29 DIAGNOSIS — G629 Polyneuropathy, unspecified: Secondary | ICD-10-CM | POA: Diagnosis not present

## 2018-01-29 DIAGNOSIS — I252 Old myocardial infarction: Secondary | ICD-10-CM | POA: Diagnosis not present

## 2018-01-29 DIAGNOSIS — D61818 Other pancytopenia: Secondary | ICD-10-CM

## 2018-01-29 DIAGNOSIS — C9001 Multiple myeloma in remission: Secondary | ICD-10-CM

## 2018-01-29 DIAGNOSIS — Z79899 Other long term (current) drug therapy: Secondary | ICD-10-CM | POA: Diagnosis not present

## 2018-01-29 DIAGNOSIS — M810 Age-related osteoporosis without current pathological fracture: Secondary | ICD-10-CM

## 2018-01-29 DIAGNOSIS — C9000 Multiple myeloma not having achieved remission: Secondary | ICD-10-CM

## 2018-01-29 DIAGNOSIS — I2510 Atherosclerotic heart disease of native coronary artery without angina pectoris: Secondary | ICD-10-CM | POA: Diagnosis not present

## 2018-01-29 DIAGNOSIS — R05 Cough: Secondary | ICD-10-CM | POA: Diagnosis not present

## 2018-01-29 LAB — CBC WITH DIFFERENTIAL (CANCER CENTER ONLY)
BASOS ABS: 0 10*3/uL (ref 0.0–0.1)
Basophils Relative: 1 %
EOS PCT: 0 %
Eosinophils Absolute: 0 10*3/uL (ref 0.0–0.5)
HEMATOCRIT: 25.1 % — AB (ref 34.8–46.6)
HEMOGLOBIN: 8.7 g/dL — AB (ref 11.6–15.9)
LYMPHS PCT: 3 %
Lymphs Abs: 0.2 10*3/uL — ABNORMAL LOW (ref 0.9–3.3)
MCH: 34.1 pg — ABNORMAL HIGH (ref 25.1–34.0)
MCHC: 34.6 g/dL (ref 31.5–36.0)
MCV: 98.6 fL (ref 79.5–101.0)
Monocytes Absolute: 0.4 10*3/uL (ref 0.1–0.9)
Monocytes Relative: 6 %
NEUTROS ABS: 6.1 10*3/uL (ref 1.5–6.5)
NEUTROS PCT: 90 %
PLATELETS: 218 10*3/uL (ref 145–400)
RBC: 2.54 MIL/uL — AB (ref 3.70–5.45)
RDW: 15.9 % — ABNORMAL HIGH (ref 11.2–14.5)
WBC: 6.7 10*3/uL (ref 3.9–10.3)

## 2018-01-29 LAB — COMPREHENSIVE METABOLIC PANEL
ALK PHOS: 64 U/L (ref 38–126)
ALT: 21 U/L (ref 0–44)
AST: 23 U/L (ref 15–41)
Albumin: 3.3 g/dL — ABNORMAL LOW (ref 3.5–5.0)
Anion gap: 7 (ref 5–15)
BUN: 16 mg/dL (ref 8–23)
CALCIUM: 8.8 mg/dL — AB (ref 8.9–10.3)
CHLORIDE: 99 mmol/L (ref 98–111)
CO2: 25 mmol/L (ref 22–32)
CREATININE: 0.77 mg/dL (ref 0.44–1.00)
GFR calc Af Amer: >60 mL/min (ref >60)
GFR calc non Af Amer: >60 mL/min (ref >60)
Glucose, Bld: 116 mg/dL — ABNORMAL HIGH (ref 70–99)
Potassium: 4 mmol/L (ref 3.5–5.1)
Sodium: 131 mmol/L — ABNORMAL LOW (ref 135–145)
Total Bilirubin: 0.8 mg/dL (ref 0.3–1.2)
Total Protein: 5.9 g/dL — ABNORMAL LOW (ref 6.5–8.1)

## 2018-01-29 MED ORDER — DIPHENHYDRAMINE HCL 25 MG PO CAPS
ORAL_CAPSULE | ORAL | Status: AC
  Filled 2018-01-29: qty 2

## 2018-01-29 MED ORDER — MONTELUKAST SODIUM 10 MG PO TABS
10.0000 mg | ORAL_TABLET | Freq: Once | ORAL | Status: AC
  Administered 2018-01-29: 10 mg via ORAL

## 2018-01-29 MED ORDER — METHYLPREDNISOLONE SODIUM SUCC 125 MG IJ SOLR
INTRAMUSCULAR | Status: AC
  Filled 2018-01-29: qty 2

## 2018-01-29 MED ORDER — SODIUM CHLORIDE 0.9 % IV SOLN
16.0000 mg/kg | Freq: Once | INTRAVENOUS | Status: AC
  Administered 2018-01-29: 900 mg via INTRAVENOUS
  Filled 2018-01-29: qty 20

## 2018-01-29 MED ORDER — MONTELUKAST SODIUM 10 MG PO TABS
ORAL_TABLET | ORAL | Status: AC
  Filled 2018-01-29: qty 1

## 2018-01-29 MED ORDER — SODIUM CHLORIDE 0.9 % IV SOLN
Freq: Once | INTRAVENOUS | Status: AC
  Administered 2018-01-29: 12:00:00 via INTRAVENOUS

## 2018-01-29 MED ORDER — DIPHENHYDRAMINE HCL 25 MG PO CAPS
50.0000 mg | ORAL_CAPSULE | Freq: Once | ORAL | Status: AC
  Administered 2018-01-29: 50 mg via ORAL

## 2018-01-29 MED ORDER — ACETAMINOPHEN 325 MG PO TABS
ORAL_TABLET | ORAL | Status: AC
  Filled 2018-01-29: qty 2

## 2018-01-29 MED ORDER — METHYLPREDNISOLONE SODIUM SUCC 125 MG IJ SOLR
125.0000 mg | Freq: Once | INTRAMUSCULAR | Status: AC
  Administered 2018-01-29: 125 mg via INTRAVENOUS

## 2018-01-29 MED ORDER — ACETAMINOPHEN 325 MG PO TABS
650.0000 mg | ORAL_TABLET | Freq: Once | ORAL | Status: AC
  Administered 2018-01-29: 650 mg via ORAL

## 2018-01-29 NOTE — Patient Instructions (Signed)
[facility] Discharge Instructions for Patients Receiving Chemotherapy  Today you received the following chemotherapy agents:  Darzalex  To help prevent nausea and vomiting after your treatment, we encourage you to take your nausea medication as prescribed.   If you develop nausea and vomiting that is not controlled by your nausea medication, call the clinic.   BELOW ARE SYMPTOMS THAT SHOULD BE REPORTED IMMEDIATELY:  *FEVER GREATER THAN 100.5 F  *CHILLS WITH OR WITHOUT FEVER  NAUSEA AND VOMITING THAT IS NOT CONTROLLED WITH YOUR NAUSEA MEDICATION  *UNUSUAL SHORTNESS OF BREATH  *UNUSUAL BRUISING OR BLEEDING  TENDERNESS IN MOUTH AND THROAT WITH OR WITHOUT PRESENCE OF ULCERS  *URINARY PROBLEMS  *BOWEL PROBLEMS  UNUSUAL RASH Items with * indicate a potential emergency and should be followed up as soon as possible.  Feel free to call the clinic should you have any questions or concerns. The clinic phone number is [phone].  Please show the CHEMO ALERT CARD at check-in to the Emergency Department and triage nurse.   

## 2018-01-30 LAB — PROTEIN ELECTROPHORESIS, SERUM
A/G Ratio: 1.3 (ref 0.7–1.7)
ALPHA-1-GLOBULIN: 0.5 g/dL — AB (ref 0.0–0.4)
Albumin ELP: 3 g/dL (ref 2.9–4.4)
Alpha-2-Globulin: 0.7 g/dL (ref 0.4–1.0)
Beta Globulin: 0.7 g/dL (ref 0.7–1.3)
Gamma Globulin: 0.5 g/dL (ref 0.4–1.8)
Globulin, Total: 2.4 g/dL (ref 2.2–3.9)
M-Spike, %: 0.2 g/dL — ABNORMAL HIGH
Total Protein ELP: 5.4 g/dL — ABNORMAL LOW (ref 6.0–8.5)

## 2018-01-30 LAB — KAPPA/LAMBDA LIGHT CHAINS
KAPPA FREE LGHT CHN: 12.9 mg/L (ref 3.3–19.4)
Kappa, lambda light chain ratio: 2.02 — ABNORMAL HIGH (ref 0.26–1.65)
LAMDA FREE LIGHT CHAINS: 6.4 mg/L (ref 5.7–26.3)

## 2018-02-05 LAB — IFE, DARA-SPECIFIC, SERUM
IGA: 6 mg/dL — AB (ref 64–422)
IGM (IMMUNOGLOBULIN M), SRM: 25 mg/dL — AB (ref 26–217)
IgG (Immunoglobin G), Serum: 638 mg/dL — ABNORMAL LOW (ref 700–1600)

## 2018-02-11 ENCOUNTER — Other Ambulatory Visit: Payer: Medicare Other

## 2018-02-11 ENCOUNTER — Ambulatory Visit: Payer: Medicare Other

## 2018-02-11 ENCOUNTER — Ambulatory Visit: Payer: Medicare Other | Admitting: Oncology

## 2018-02-11 ENCOUNTER — Telehealth: Payer: Self-pay

## 2018-02-11 NOTE — Telephone Encounter (Signed)
Please ask her to come in for visit (she was asked to follow up with her PCP after discharge)  to address these issues  Let her know that according to Discharge summary she was given information about contacting orthopedics (we can put in a referral if their office needs one) Thanks  [place]

## 2018-02-11 NOTE — Telephone Encounter (Signed)
Patient called. Suffered pelvic fracture two weeks ago and then left on family vacation. Will be coming home in 4 days. Has following questions/requests:  1. Would like referral to orthopedics and perhaps to PT or rehab. 2. Needs some strategies for sleeping comfortably/pain management. 3. Was initially given Morphine #5 tablets to use while sleeping. Finished those and has been taking 2 Tylenol every 6 hours. Not sleeping comfortably. Could she possibly get #4 Morphine to help her sleep until she gets home?  Call back is [phone]  Danley Danker, RN [facility])

## 2018-02-12 NOTE — Telephone Encounter (Signed)
Contacted pt and gave her the below information and scheduled her an appointment.  She said she will check her papers to see if there is a orthopedic contact in her papers and if not she will call us back to get a referral placed. Katharina Caper, April D, [place]

## 2018-02-16 ENCOUNTER — Other Ambulatory Visit: Payer: Self-pay

## 2018-02-16 NOTE — Patient Outreach (Signed)
[facility]) Care Management  02/16/2018  PENNY ARRAMBIDE [birth year] [id number]   Medication Adherence call to Mrs. Marcella Dubs spoke with patient she said doctor took her off Lisinopril 2.5 mg about a month ago patient is no longer taking it. Mrs. Spelman is showing past due under [place].  [place] Management Direct Dial [phone]  Fax [phone] [email or web address]

## 2018-02-16 NOTE — Telephone Encounter (Signed)
Message left on clinic nurse voice mail - had pelvic fracture 3 weeks ago and needs name of orthopedic office to follow up with.  AVS did not specify ortho MD name or office.  Returned call to patient and left message to call our office back.   Burna Forts, BSN, RN-BC

## 2018-02-16 NOTE — Telephone Encounter (Signed)
Pt has decided to go to [facility] walk in clinic because "this is the 3rd week".  She will call back if she needs a referral from [place].  Advised to call them before going to make sure they would see her. Shannon Rush, [name], [name]

## 2018-02-18 DIAGNOSIS — S32592A Other specified fracture of left pubis, initial encounter for closed fracture: Secondary | ICD-10-CM | POA: Diagnosis not present

## 2018-02-23 NOTE — Progress Notes (Signed)
[facility] ONCOL[facility] OFFICE PROGRESS NOTE  PCP: [name], MD [place] [address]  Date of Service:  02/26/2018   DIAGNOSIS: Multiple myeloma with relapse  CHIEF COMPLAINTS: F/u of MM   Oncology History   Multiple myeloma ([place])   Staging form: Multiple Myeloma, AJCC 6th Edition   - Clinical: Stage IIIA - Signed by [name], MD on 09/04/2013      Multiple myeloma ([place])   05/26/2012 Initial Diagnosis    Presenting IgG was 4,040 mg/dL on 06/05/2012 (IgA 35; IgM 34); kappa free light chain 34.4 mg/dL, lambda 0.00, kappa:lambda ratio of 34.75; SPEP with M-spike of 2.60.    06/30/2012 Imaging    Numerous lytic lesions throughout the calvarium.  Lytic lesion within the left lateral scapula.  Findings most compatible with metastases or myeloma. Multiple mid and lower thoracic compression fractures.  Slight compression through the endplates at L3.    85/88/5027 Bone Marrow Biopsy    Bone marrow biopsy showed 49% plasma cell. Normal classical cytogenetics; however, myeloma FISH panel showed 13q- (intermediate risk)        07/14/2012 - 07/27/2012 Radiation Therapy    Palliative radiation 20 Gy over 10 fractions between to thoracic spine cord compression and symptomatic left scapula lytic lesion.    08/10/2012 - 11/2012 Chemotherapy    Started SQ Velcade once weekly, 3 weeks on, 1 weeks off; daily Revlimid d1-21, 7 days off; and Dexamethasone '40mg'$  PO weekly.     02/12/2013 Bone Marrow Transplant    Auto bone marrow transplant at [facility]      06/14/2013 Tumor Marker    IgG  1830,  Kappa:lamba ratio, 1.69 (baseline was 34.75 or   M-spike 0.28 (baseline of 2.6 or  89.3% of baseline).      06/25/2013 -  Chemotherapy    Started zometa 3.5 mg monthly     09/01/2013 - 01/2014 Chemotherapy    Maintenance therapy with revlimid '5mg'$  daily for 14 days and then off for 7 (decreased from 21 days on and 7 days off based on neutropenia). Stopped due to disease  progression 01/2017    09/13/2013 - 09/17/2014 Hospital Admission    Hospital admission for pneumonia    12/20/2013 Treatment Plan Change    Maintenance therapy decreased to 2.5 mg daily for 21 days and then off for 7 days based on low counts.     01/25/2014 Tumor Marker    IgG 1360 M spike 0.5    03/01/2014 - 03/05/2014 Hospital Admission    [facility] center with an inferior STEMI, received thrombolytic therapy, cath showed 60% prox RCA, mid-distal RCA 50%, Echo normal, no stents.    04/11/2014 - 08/07/2014 Chemotherapy    Continue Revlimid at 2.5 mg 3 weeks on 1 week off    08/08/2014 - 08/13/2014 Hospital Admission    Hospital admission for pneumonia. Her Revlimid was held.    08/29/2014 - 09/14/2014 Chemotherapy    Maintenance Revlimid restarted    09/14/2014 - 09/17/2014 Hospital Admission    Admitted for pneumonia after coming back from a trip in [name]. Revlimid was held again.    11/13/2014 - 12/25/2015 Chemotherapy    Maintenance Revlimid restarted, changed to 2.'5mg'$  daily, 2 weks on, 1 week off from 12/15/2014, stopped due to disease progression     01/24/2016 Progression    Patient is M protein has gradually increased to 1.0g, repeated a bone marrow biopsy showed plasma cell 10-30%  02/27/2016 - 03/07/2016 Chemotherapy    Pomalidomide 4 mg daily on day 1-21, dexamethasone 40 mg on day 1, 8 and 15, every 28 days, started on 02/27/2016, daratumumab weekly started on 03/07/2016, held after first dose, due to hospitalization and a severe pancytopenia, pomalidomide was stopped afterwards     03/10/2016 - 03/29/2016 Hospital Admission    Pt was admitted for sepsis from pneumonia, and severe pancytopenia. She required ICU stay for a few days due to hypotension, developed b/l pleural effusion required diuretics and b/l thoracentesis. She also required blood and plt transfusion, and prolonged neupogen injection for severe neutropenia. She was discharged home after 3 weeks  hospital stay     04/26/2016 -  Chemotherapy    Daratumumab restarted on 04/26/2016, Velcade 1.82m/m2 and dexa 286mweekly start on 11/3, velcade held since 06/28/2016 due to CHF     08/26/2016 Echocardiogram    - Left ventricle: LVEF is approximately 35% with diffuse diffuse   hypokinesis with severe hypokinesis/ akinesis of the   inferior/inferoseptal walls. In comparison to echo images from   December 2017, there does not appear to be significant change The   cavity size was normal. Wall thickness was normal. Doppler   parameters are consistent with abnormal left ventricular   relaxation (grade 1 diastolic dysfunction). - Aortic valve: AV is thickened. 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 Present in echo from December 2017 There was   trivial regurgitation. - Right ventricle: Systolic function was mildly reduced. - Right atrium: The atrium was mildly dilated. - Pericardium, extracardiac: A trivial pericardial effusion was   identified.    12/03/2016 Echocardiogram    -Left Ventricle: Systolic function was   mildly to moderately reduced. The estimated ejection fraction was   in the range of 40% to 45%. Diffuse hypokinesis. Doppler   parameters are consistent with abnormal left ventricular   relaxation (grade 1 diastolic dysfunction). Doppler parameters   are consistent with indeterminate ventricular filling pressure. - Left atrium: The atrium was severely dilated. - Tricuspid valve: There was trivial regurgitation. - Pulmonary arteries: Systolic pressure was within the normal   range. PA peak pressure: 33 mm Hg (S). - Global longitudinal strain -10.5% (abnormal)    03/12/2017 Echocardiogram    ECHO 03/12/17 Study Conclusions - Left ventricle: The cavity size was normal. Wall thickness was   increased in a pattern of mild LVH. Systolic function was normal.   The estimated ejection fraction was in the range of 50%  to 55%.   There is hypokinesis of the basalinferior myocardium. Doppler   parameters are consistent with abnormal left ventricular   relaxation (grade 1 diastolic dysfunction). - Aortic valve: There was trivial regurgitation. - Mitral valve: Calcified annulus. Mildly thickened leaflets .   There was trivial regurgitation. - Tricuspid valve: There was mild regurgitation. Impressions: - There has been mild improvement in EF since prior study.     12/12/2017 - 12/15/2017 Hospital Admission    Admission diagnosis: Pnuemonia and acute hypoxic respiratory failure Additional comments: treated for pneumoania and acute hypoxic respiratory failure before she was discharged on 12/15/17 with oral anitbiotics. She did not require home oxygen.     12/30/2017 - 01/02/2018 Hospital Admission    Admission diagnosis: Pnuemonia  Additional comments: She was again hospitalized on 12/30/17 for pneumonia, ID work up was otherwise negative, she was discharged home with oral antibiotics.       CURRENT TREATMENT:  1. Pomalidomide 4 mg daily on day 1-21, dexamethasone 40on day 1, 8 and 15, every 28 days, started on 02/27/2016, daratumumab weekly started on 03/07/2016, treatment on hold since 03/09/2016 due to hospitalization and severe cytopenia 2. Daratumumab restarted on 04/26/2016, Velcade 1.'3mg'$ /m2 and dexa '20mg'$  weekly start on 11/3, velcade held since 06/28/2016 due to CHF. Tonette Bihari was changed from every 4 weeks to every 2 weeks on 03/28/2017 per Dr. Kendell Bane recommendation. Held after 11/21/17 treatment due to recurrent pnuemonia, restarted 01/09/18 at every 3-4 weeks.  INTERVAL HISTORY:  BRIONNA ROMANEK 77 y.o. female here for a follow up. She is here alone today at the infusion room. She went to the ER on 01/27/2018 for fall. Upon ED evaluation CT of the pelvis showed left superior and inferior comminuted pubic rami fracture. Case was discussed with orthopedic surgery who recommended conservative management and pain  control, PT, and outpatient follow-up. Today, she is recovering well. She is still having pain, but it's improved and manageable.  She denies bleeding or bruising. She has no new complaints.   She is planning a trip to [place] early next month.    MEDICAL HISTORY: Past Medical History:  Diagnosis Date  . CAD (coronary artery disease)    a. inf STEMI (in [place] >>> [facility]) >>> LHC (8/15- [place]):  pRCA 60%, mid to dist RCA 50% >>> med rx  . Cord compression ([place]) 07/13/12   MRI- diffuse myeloma involvement of T-L spine  . History of radiation therapy 07/13/12-07/27/12   spinal cord compression T3-T10,left scapula  . Hx of echocardiogram    Echo (03/02/14-done at [facility]):  Normal LVEF without significant regional wall motion abnormalities. Mild   . Multiple myeloma ([place]) 07/01/2012  . OSTEOPOROSIS 06/11/2010   Multiple compression fractures; and spontaneous fracture of sternum Qualifier: Diagnosis of  By: [name], [name]     . Thoracic kyphosis 07/13/12   per MRI scan  . Unspecified deficiency anemia    ALLERGIES:  is allergic to zithromax [azithromycin]; zosyn [piperacillin sod-tazobactam so]; and quinolones.  SURGICAL HISTORY:  Past Surgical History:  Procedure Laterality Date  . APPENDECTOMY    . CESAREAN SECTION     x2   . COLONOSCOPY  2007   neg with Dr. [name]  . ELBOW SURGERY    . HIP SURGERY  2009   left  . TUBAL LIGATION     REVIEW OF SYSTEMS:   Constitutional: Denies abnormal weight loss (+) improved energy Eyes: Denies blurriness of vision  Ears, nose, mouth, throat, and face: Denies mucositis   Respiratory: Denies dyspnea or wheezes  Cardiovascular: Denies palpitation, chest discomfort  Gastrointestinal:  Denies heartburn   Skin: Denies abnormal skin rashes  Lymphatics: Denies new lymphadenopathy or easy bruising Neurological:Denies new weaknesses. (+) Positive for paraesthesias and numbness to the feet  (baseline). Stable.  Behavioral/Psych: Mood is stable, no new changes  All other systems were reviewed with the patient and are negative.   PHYSICAL EXAMINATION:   ECOG PERFORMANCE STATUS: 1 Blood pressure 97/68, heart rate 75, respirate 16, temperature 36.4, pulse ox 98% on room air GENERAL:alert, no distress and comfortable; easily mobile to exam table; kyphosis, moderate.  SKIN: skin color, texture, turgor are normal, no rashes or significant lesions except a small healing, dry wound in the right lower leg above his ankle, with surrounding skin erythema and swelling EYES: normal, Conjunctiva are pink, 1 tear/injection blood vessel left eye in sclera. OROPHARYNX:no exudate, no  erythema and lips, buccal mucosa, and tongue normal, no ONJ NECK: supple, thyroid normal size, non-tender, without nodularity; nodules cervical bilaterally.  LYMPH:  no palpable lymphadenopathy in the cervical, axillary or supraclavicular LUNGS: clear, no crackles in b/l lung base, no wheezing. HEART: regular rate & rhythm and no murmurs  ABDOMEN:abdomen soft, and normal bowel sounds  Musculoskeletal:no cyanosis of digits and no clubbing, NEURO: alert & oriented x 3 with fluent speech, no focal motor/sensory deficits   Labs:  CBC Latest Ref Rng & Units 02/26/2018 01/29/2018 01/27/2018  WBC 3.9 - 10.3 K/uL 4.4 6.7 4.5  Hemoglobin 11.6 - 15.9 g/dL 10.7(L) 8.7(L) 9.3(L)  Hematocrit 34.8 - 46.6 % 32.7(L) 25.1(L) 27.7(L)  Platelets 145 - 400 K/uL 205 218 209    CMP Latest Ref Rng & Units 02/26/2018 01/29/2018 01/27/2018  Glucose 70 - 99 mg/dL 107(H) 116(H) 106(H)  BUN 8 - 23 mg/dL '18 16 19  '$ Creatinine 0.44 - 1.00 mg/dL 0.77 0.77 0.78  Sodium 135 - 145 mmol/L 140 131(L) 137  Potassium 3.5 - 5.1 mmol/L 3.8 4.0 4.0  Chloride 98 - 111 mmol/L 105 99 105  CO2 22 - 32 mmol/L '25 25 24  '$ Calcium 8.9 - 10.3 mg/dL 9.1 8.8(L) 8.8(L)  Total Protein 6.5 - 8.1 g/dL 6.4(L) 5.9(L) -  Total Bilirubin 0.3 - 1.2 mg/dL 0.6 0.8 -   Alkaline Phos 38 - 126 U/L 145(H) 64 -  AST 15 - 41 U/L 18 23 -  ALT 0 - 44 U/L 13 21 -    SPEP M-PROTEIN  10/01/2013: 0.18 (nadir)  10/13/2014: 0.6 11/21/2014: 0.5 01/30/2015: 0.6 03/13/2015: 0.6 04/20/2015: 0.5 06/01/2015: 0.5 07/20/2015: 0.7 09/14/2015: 0.7 10/26/2015: 0.8 12/07/2015: 1.0 02/07/2016: 1.1  04/03/2016: 0.2 05/03/2016: 0.2  05/31/2016: 0.1 06/28/2016: 0.1 07/25/2016: 0.1 08/21/2016: 0.1 09/27/2016: 0.1 10/23/16: 0.1 12/27/2016: 0.1 (Dara Specific IFE was still positive) 01/24/17: 0.1 02/28/17: 0.1 03/28/17: 0.1 04/25/17: 0.1 05/23/17: 0.1 06/27/17: 0.1 08/01/17: 0.1 2:15/19: 0.1 09/26/17: 0.2 10/24/17: 0.1 11/21/17: 0.2 01/09/18: 0.2 01/29/18: 0.2   IgG ([id number]) 10/13/2014: 1710 11/21/2014: 1460 01/30/2015: 1380 03/13/15: 1570 04/20/2015: 1550 06/01/2015: 1590 07/22/2015: 1770 09/14/2015: 1787 10/26/2015: 1898 12/07/2015: 2045 02/07/2016: 1883 04/03/2016: 787 05/03/2016: 685  05/31/2016: 544 06/28/2016: 463 07/25/2016: 455 08/21/2016: 426 09/27/2016: 438 10/23/16: 374 12/06/16: 370 12/27/16: 343, 346 01/24/17: 332  02/28/17: 362 03/28/17:  436 04/25/17: 431 05/23/17: 471 06/27/17: 429 08/01/17: 392 08/29/17: 409 09/26/17: 421 10/24/17: 438 11/21/17: 465 01/09/18: 756 01/29/18: 638  Serum kappa, lambda light chains, and ratio  11/21/2014: 3.95, 2.08, 1.90  01/30/2015: 3.92, 2.64, 1.48 03/13/2015: 3.55, 2.17, 1.64 04/20/15: 4.52, 1.78, 2.54 06/01/2015: 4.82, 1.75, 2.75 07/20/2015: 8.71, 2.43, 3.58 09/14/2015: 9.17, 2.02, 4.55 10/26/2015: 11.4, 1.75, 6.42 12/07/2015: 1.54, 1.83, 8.44 02/07/2016: 1.93, 1.42, 13.6 04/03/2016: 0.96, 0.95, 1.01  05/03/2016: 0.71, 0.34, 2.09  05/31/2016: 0.48, 0.26, 1.85 06/28/2016: 0.52, 0.60, 0.87 07/25/2016: 0.46, 0.27, 1.70 08/21/2016: 0.43, 0.27, 1.59 09/27/2016: 0.52, 0.24, 2.17 10/23/16: .0.49, 0.35, 1.40 12/06/16: 0.68, 0.29, 2.34 12/27/16: 0.54, 0.29, 1.86 02/28/17: 0.56, 0.30, 1.87 04/11/17: 0.61, 0.29, 2.10 05/09/17: 0.57, 0.33,  1.73 06/13/17: 0.65, 0.23, 2.83 07/18/17: 0.75 0.26, 2.83 08/15/17: 0.78, 0.27, 2.89 08/29/17: 0.79, 0.3, 2.63 09/26/17: 0.88, 0.26, 3.38  10/24/17: 0.95, 0.36, 2.64 11/21/17: 0.99, 0.24, 4.13 01/09/18: 1.79, 1.01, 1.77 01/29/18: 12.9, 6.4, 2.02   24 hr urine PEP (total protein, M-spike) 10/13/2014: (-) 02/12/2016: '265mg'$ , '68mg'$  04/05/2016: 114 mg, (-) 08/08/16: 99, (-) 12/27/16: '59mg'$ , (-) 03/28/17: '96mg'$ , (-) 01/22/18: not observed   PATHOLOGY REPORT  Diagnosis 01/24/2016  Bone Marrow, Aspirate,Biopsy, and Clot, RT iliac and core - VARIABLY CELLULAR BONE MARROW WITH PLASMA CELL NEOPLASM. - SEE COMMENT. PERIPHERAL BLOOD: - MACROCYTIC ANEMIA.  Diagnosis Note The bone marrow is variably cellular with trilineage hematopoiesis and non specific myeloid changes. In this background, the plasma cell component is variable ranging from less than 10 % in many areas to 30% in some aspirate particles with relatively low cellularity. Foci of interstitial infiltrates and small clusters are also seen in the clot section. Immunohistochemical stains show kappa light chain restriction in atypical plasma cell clusters and infiltrates consistent with plasma cell neoplasm. Correlation with cytogenetic and FISH studies is recommended. (BNS:gt, 01-Feb-2016)  Diagnosis 03/28/16 PLEURAL FLUID, LEFT (SPECIMEN 1 OF 1 COLLECTED 03/28/16): REACTIVE MESOTHELIAL CELLS  RADIOGRAPHIC STUDIES:  06/26/2017 ECHO LV EF: 55% -   60%  ECHO 03/12/17 Study Conclusions - Left ventricle: The cavity size was normal. Wall thickness was   increased in a pattern of mild LVH. Systolic function was normal.   The estimated ejection fraction was in the range of 50% to 55%.   There is hypokinesis of the basalinferior myocardium. Doppler   parameters are consistent with abnormal left ventricular   relaxation (grade 1 diastolic dysfunction). - Aortic valve: There was trivial regurgitation. - Mitral valve: Calcified annulus. Mildly thickened  leaflets .   There was trivial regurgitation. - Tricuspid valve: There was mild regurgitation. Impressions: - There has been mild improvement in EF since prior study.    Echo 12/03/16 -Left Ventricle: Systolic function was   mildly to moderately reduced. The estimated ejection fraction was   in the range of 40% to 45%. Diffuse hypokinesis. Doppler  parameters are consistent with abnormal left ventricular relaxation (grade 1 diastolic dysfunction). Doppler parameters are consistent with indeterminate ventricular filling pressure. - Left atrium: The atrium was severely dilated. - Tricuspid valve: There was trivial regurgitation. - Pulmonary arteries: Systolic pressure was within the normal   range. PA peak pressure: 33 mm Hg (S). - Global longitudinal strain -10.5% (abnormal)  Echo 08/26/2016 - Left ventricle: LVEF is approximately 35% with diffuse hypokinesis with severe hypokinesis/ akinesis of the inferior/inferoseptal walls. In comparison to echo images from December 2017, there does not appear to be significant change The cavity size was normal. Wall thickness was normal. Doppler parameters are consistent with abnormal left ventricular relaxation (grade 1 diastolic dysfunction). - Aortic valve: AV is thickened. 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 Present in echo from December 2017 There was trivial regurgitation. - Right ventricle: Systolic function was mildly reduced. - Right atrium: The atrium was mildly dilated. - Pericardium, extracardiac: A trivial pericardial effusion was identified.  ASSESSMENT:   Shannon Rush 77 y.o. female with a history of Multiple Myeloma s/p autotransplant in August 2014 followed by maintenance low dose Revlimid therapy, and relapsed in 01/2016   1. IgG kappa Multiple myeloma s/p Auto SCT, intermediate risk, relapsed in 01/2016 --MM markers demonstrate a very good initial response (M protein  baseline 2.6, down to 0.18 after induction chemo and transplant, stable around 0.5 since July 2015, but has gradually increased lately),  she unfortunately relapsed in July 2017 -I previously discussed her repeated bone marrow biopsy from 02/01/2016, which showed 10-30% plasma cells in her marrow -I agreed with Dr. Kendell Bane recommendation to stop Revlimid and change to Daratumumab, Pomalidomide and dexamethasone due to her disease relapse. I don't think she has resistance to Revlimid, she has been on  very small dose of Revlimid due to her prior history of multiple infections. -She started first cycle DaraPD in 02/2016, but 2 weeks later she developed severe pancytopenia, pneumonia with sepsis required 3 weeks hospital stay. She did have a very nice response to the treatment, her M-protein has significantly reduced from 1.2 to 0.2, light chain levels normalized even with half cycle treatment  -I previously recommended her to change regiment to DaraVD, and started her on dara every 2 weeks for 2 doses, she tolerated this very well -she has had good response to treatment, M-protein significantly dropped -Her previous echo in December 2017 showed a significant drop on EF, she is not much symptomatically for CHF, due to the potential contribution of Velcade to her CHF, I'll hold her Velcade for now. Her repeated echo in May 2018 showed a significant improvement, EF 40-45%. -her dara-specific IFE in 12/2016 was still positive, she has low level M-protein, she has achieved a good partial response. -She hasn't had Velcade since December 2017. Will continue holding it for now due to her CHF -We previously discussed her ECHO from 02/2017 and 06/2017 that showed her heart function has normalized to EF of 50% to 55% and 55-60% respectively.  -She has seen [facility]. [name] at [facility] who suggests she use a combination treatment of Dara + low dose Pomalidomide with and increase treatment to every 2 weeks.  -Due to her  previous severe cytopenia and infection, and a prolonged excellent response to single agent Dara, I did not recommend her to change to DaraPD again. We decided to change Dara/dexa treatment to every 2 weeks on 03/28/17, she has been tolerating well overall. Her M-protein level has been stable (0.1-0.2) since then  -If she has disease progression, I would consider adding Velcade back -Treatment was previously held 11/21/17-01/09/18 due to hospitalization from recurrent pneumonia. I previously discussed we will change her dura to every 3-4 weeks due to her recurrent infection.  -Labs reviewed, her recent M Protein is stable at 0.2, CBC overall stable, CMP showed alkaline phosphate at 145.  We will proceed daratumumab infusion today -She recovered well from recent pelvic fracture. -I will change her Daratumumab to every 3 weeks now -F/u in on 03/17/2018  2. Recurrent pneumonia  -Presented after her cruise with productive cough with white-yellowish phlegm, congestion, upper abdominal soreness, diarrhea/watery stool, , nausea, sore throat, no appetite, weight loss, fever and headaches. -Previous exam showed bilateral rales and rhonchi. She desatted to 85% on room air when she walks, highly suspicious for b/l pneumonia. -She previously had multiple episodes of pneumonia, which required ICU stay and prolonged hospital stay. She is immunocompromised due to her treatment -After ED recommendation she was admitted on 12/12/17 and was treated for pneumonia and acute hypoxic respiratory failure before she was discharged on 12/15/17 with oral antibiotics.  -She was again hospitalized on 12/30/17 as her pneumonia did not resolve. I previously suggested ID consult and workup to rule out opportunistic infections as she is immunocompromised. She was discharged on 01/02/18. Her ID workup was negative. -She completed her antibiotics and is recovering well. Still has mild productive cough. I suggest she take OTC muscinex to loosen  her mucous.  -Resolved  3. Pancytopenia  -secondary to Dara, slightly worse after we increased Dara to every 2 weeks  -Continue monitoring closely. -WBC at 4.4, Hg at 10.7 Blood counts showed Abs lymphs of 0.6.   4. CHF, CAD, inferior STEMI in 02/2014 -continue metoprolol and lipitor, Dr. Meda Coffee previously  started her on lisinopril also -Dr. Meda Coffee plans to repeat her echo every 3 months while on treatment.  -I previously encouraged her to watch for leg swelling and SOB and chest pain as these are symptoms of her heart condition. -Her latest ECHO from 06/2017 shows improved heart function with EF of 55-60%.   5. Osteoporosis with multiple fractures  -previous bone density scan showed worsening osteoporosis, continue Zometa, changed to every 3 months after 03/2015.  -She declined repeating DEXA  -She was on Zometa for 4 years, the risk probably outweighs the benefit so we stopped it, last injection was 10/23/16 -she recently had pelvic fracture after a fall in 01/2018  6. Peripheral neuropathy in feet, G1-2 -Secondary to previous chemotherapy  -We previously discussed the role of Neurontin, she declined. -I previously suggested Vitamin B complex and continue practice of circulation of her feet. I advised her to be careful to not fall.  -Stable, will continue to monitor, she will let us know if this worsens.   7. Goal of care discussion  -We previously discussed the incurable nature of her cancer, and the overall poor prognosis, especially if she does not have good response to chemotherapy or progress on chemo -The patient understands the goal of care is palliative. -she is full code now    Plan -lab reviewed, adequate for treatment, will proceed daratumumab infusion today.  She took dexamethasone this morning  -f/u on 03/17/2018 -Continue Daratumumab every 3 weeks   All questions were answered. The patient knows to call the clinic with any problems, questions or concerns. We can  certainly see the patient much sooner if necessary.  I spent 20 minutes counseling the patient face to face. The total time spent in the appointment was 25 minutes.  Dierdre Searles Dweik am acting as scribe for Dr. [name].  I have reviewed the above documentation for accuracy and completeness, and I agree with the above.     [name]  02/26/2018

## 2018-02-26 ENCOUNTER — Inpatient Hospital Stay: Payer: Medicare Other | Admitting: Nutrition

## 2018-02-26 ENCOUNTER — Other Ambulatory Visit: Payer: Self-pay

## 2018-02-26 ENCOUNTER — Inpatient Hospital Stay (HOSPITAL_BASED_OUTPATIENT_CLINIC_OR_DEPARTMENT_OTHER): Payer: Medicare Other | Admitting: Hematology

## 2018-02-26 ENCOUNTER — Inpatient Hospital Stay: Payer: Medicare Other | Attending: Hematology

## 2018-02-26 ENCOUNTER — Encounter: Payer: Self-pay | Admitting: Hematology

## 2018-02-26 ENCOUNTER — Inpatient Hospital Stay: Payer: Medicare Other

## 2018-02-26 VITALS — BP 97/68 | HR 75 | Temp 97.6°F | Resp 16

## 2018-02-26 DIAGNOSIS — Z9221 Personal history of antineoplastic chemotherapy: Secondary | ICD-10-CM | POA: Diagnosis not present

## 2018-02-26 DIAGNOSIS — D61818 Other pancytopenia: Secondary | ICD-10-CM

## 2018-02-26 DIAGNOSIS — C9002 Multiple myeloma in relapse: Secondary | ICD-10-CM | POA: Diagnosis not present

## 2018-02-26 DIAGNOSIS — I509 Heart failure, unspecified: Secondary | ICD-10-CM | POA: Diagnosis not present

## 2018-02-26 DIAGNOSIS — Z8701 Personal history of pneumonia (recurrent): Secondary | ICD-10-CM | POA: Diagnosis not present

## 2018-02-26 DIAGNOSIS — C9001 Multiple myeloma in remission: Secondary | ICD-10-CM

## 2018-02-26 DIAGNOSIS — C9000 Multiple myeloma not having achieved remission: Secondary | ICD-10-CM

## 2018-02-26 DIAGNOSIS — Z79899 Other long term (current) drug therapy: Secondary | ICD-10-CM

## 2018-02-26 DIAGNOSIS — I252 Old myocardial infarction: Secondary | ICD-10-CM | POA: Diagnosis not present

## 2018-02-26 DIAGNOSIS — Z9481 Bone marrow transplant status: Secondary | ICD-10-CM | POA: Insufficient documentation

## 2018-02-26 DIAGNOSIS — Z5112 Encounter for antineoplastic immunotherapy: Secondary | ICD-10-CM | POA: Insufficient documentation

## 2018-02-26 DIAGNOSIS — Z923 Personal history of irradiation: Secondary | ICD-10-CM

## 2018-02-26 DIAGNOSIS — M810 Age-related osteoporosis without current pathological fracture: Secondary | ICD-10-CM | POA: Insufficient documentation

## 2018-02-26 DIAGNOSIS — T451X5S Adverse effect of antineoplastic and immunosuppressive drugs, sequela: Secondary | ICD-10-CM | POA: Diagnosis not present

## 2018-02-26 DIAGNOSIS — G620 Drug-induced polyneuropathy: Secondary | ICD-10-CM | POA: Diagnosis not present

## 2018-02-26 DIAGNOSIS — I2510 Atherosclerotic heart disease of native coronary artery without angina pectoris: Secondary | ICD-10-CM | POA: Diagnosis not present

## 2018-02-26 LAB — CBC WITH DIFFERENTIAL (CANCER CENTER ONLY)
BASOS ABS: 0 10*3/uL (ref 0.0–0.1)
Basophils Relative: 1 %
EOS PCT: 1 %
Eosinophils Absolute: 0.1 10*3/uL (ref 0.0–0.5)
HCT: 32.7 % — ABNORMAL LOW (ref 34.8–46.6)
Hemoglobin: 10.7 g/dL — ABNORMAL LOW (ref 11.6–15.9)
LYMPHS PCT: 14 %
Lymphs Abs: 0.6 10*3/uL — ABNORMAL LOW (ref 0.9–3.3)
MCH: 33.3 pg (ref 25.1–34.0)
MCHC: 32.7 g/dL (ref 31.5–36.0)
MCV: 101.9 fL — AB (ref 79.5–101.0)
Monocytes Absolute: 0.5 10*3/uL (ref 0.1–0.9)
Monocytes Relative: 11 %
Neutro Abs: 3.2 10*3/uL (ref 1.5–6.5)
Neutrophils Relative %: 73 %
PLATELETS: 205 10*3/uL (ref 145–400)
RBC: 3.21 MIL/uL — ABNORMAL LOW (ref 3.70–5.45)
RDW: 16.3 % — AB (ref 11.2–14.5)
WBC Count: 4.4 10*3/uL (ref 3.9–10.3)

## 2018-02-26 LAB — COMPREHENSIVE METABOLIC PANEL
ALBUMIN: 3.8 g/dL (ref 3.5–5.0)
ALT: 13 U/L (ref 0–44)
ANION GAP: 10 (ref 5–15)
AST: 18 U/L (ref 15–41)
Alkaline Phosphatase: 145 U/L — ABNORMAL HIGH (ref 38–126)
BILIRUBIN TOTAL: 0.6 mg/dL (ref 0.3–1.2)
BUN: 18 mg/dL (ref 8–23)
CO2: 25 mmol/L (ref 22–32)
Calcium: 9.1 mg/dL (ref 8.9–10.3)
Chloride: 105 mmol/L (ref 98–111)
Creatinine, Ser: 0.77 mg/dL (ref 0.44–1.00)
GFR calc Af Amer: >60 mL/min (ref >60)
GFR calc non Af Amer: >60 mL/min (ref >60)
Glucose, Bld: 107 mg/dL — ABNORMAL HIGH (ref 70–99)
POTASSIUM: 3.8 mmol/L (ref 3.5–5.1)
SODIUM: 140 mmol/L (ref 135–145)
TOTAL PROTEIN: 6.4 g/dL — AB (ref 6.5–8.1)

## 2018-02-26 MED ORDER — DIPHENHYDRAMINE HCL 25 MG PO CAPS
ORAL_CAPSULE | ORAL | Status: AC
  Filled 2018-02-26: qty 1

## 2018-02-26 MED ORDER — MONTELUKAST SODIUM 10 MG PO TABS
10.0000 mg | ORAL_TABLET | Freq: Once | ORAL | Status: AC
  Administered 2018-02-26: 10 mg via ORAL

## 2018-02-26 MED ORDER — METHYLPREDNISOLONE SODIUM SUCC 125 MG IJ SOLR
INTRAMUSCULAR | Status: AC
  Filled 2018-02-26: qty 2

## 2018-02-26 MED ORDER — DEXAMETHASONE 4 MG PO TABS: ORAL_TABLET | ORAL | 1 refills | Status: DC

## 2018-02-26 MED ORDER — DIPHENHYDRAMINE HCL 25 MG PO CAPS
ORAL_CAPSULE | ORAL | Status: AC
  Filled 2018-02-26: qty 2

## 2018-02-26 MED ORDER — DIPHENHYDRAMINE HCL 25 MG PO CAPS
50.0000 mg | ORAL_CAPSULE | Freq: Once | ORAL | Status: AC
  Administered 2018-02-26: 50 mg via ORAL

## 2018-02-26 MED ORDER — ACETAMINOPHEN 325 MG PO TABS
ORAL_TABLET | ORAL | Status: AC
  Filled 2018-02-26: qty 2

## 2018-02-26 MED ORDER — SODIUM CHLORIDE 0.9 % IV SOLN
Freq: Once | INTRAVENOUS | Status: AC
  Administered 2018-02-26: 13:00:00 via INTRAVENOUS
  Filled 2018-02-26: qty 250

## 2018-02-26 MED ORDER — MONTELUKAST SODIUM 10 MG PO TABS
ORAL_TABLET | ORAL | Status: AC
  Filled 2018-02-26: qty 1

## 2018-02-26 MED ORDER — ACETAMINOPHEN 325 MG PO TABS
650.0000 mg | ORAL_TABLET | Freq: Once | ORAL | Status: AC
  Administered 2018-02-26: 650 mg via ORAL

## 2018-02-26 MED ORDER — METHYLPREDNISOLONE SODIUM SUCC 125 MG IJ SOLR
125.0000 mg | Freq: Once | INTRAMUSCULAR | Status: AC
  Administered 2018-02-26: 125 mg via INTRAVENOUS

## 2018-02-26 MED ORDER — DARATUMUMAB CHEMO INJECTION 400 MG/20ML
16.0000 mg/kg | Freq: Once | INTRAVENOUS | Status: AC
  Administered 2018-02-26: 900 mg via INTRAVENOUS
  Filled 2018-02-26: qty 40

## 2018-02-26 NOTE — Patient Instructions (Signed)
[facility] Discharge Instructions for Patients Receiving Chemotherapy  Today you received the following chemotherapy agents :  Daratumumab.  To help prevent nausea and vomiting after your treatment, we encourage you to take your nausea medication as prescribed.   If you develop nausea and vomiting that is not controlled by your nausea medication, call the clinic.   BELOW ARE SYMPTOMS THAT SHOULD BE REPORTED IMMEDIATELY:  *FEVER GREATER THAN 100.5 F  *CHILLS WITH OR WITHOUT FEVER  NAUSEA AND VOMITING THAT IS NOT CONTROLLED WITH YOUR NAUSEA MEDICATION  *UNUSUAL SHORTNESS OF BREATH  *UNUSUAL BRUISING OR BLEEDING  TENDERNESS IN MOUTH AND THROAT WITH OR WITHOUT PRESENCE OF ULCERS  *URINARY PROBLEMS  *BOWEL PROBLEMS  UNUSUAL RASH Items with * indicate a potential emergency and should be followed up as soon as possible.  Feel free to call the clinic should you have any questions or concerns. The clinic phone number is [phone].  Please show the CHEMO ALERT CARD at check-in to the Emergency Department and triage nurse.   

## 2018-02-26 NOTE — Progress Notes (Signed)
Brief nutrition follow-up completed with patient during infusion. Patient was recently admitted to the hospital and was assessed to be underweight. Spoke briefly with patient who reports food tastes bland. She confirms that she will not drink oral nutrition supplements. Patient denies nausea and vomiting.  She also denies bowel issues.  Educated patient to try to increase calories and protein throughout the day. Reviewed high-protein foods. Educated patient on taste alterations. Provided recipes. Provided contact information and encouraged patient to call us with any questions or concerns.  **Disclaimer: This note was dictated with voice recognition software. Similar sounding words can inadvertently be transcribed and this note may contain transcription errors which may not have been corrected upon publication of note.**

## 2018-02-27 LAB — PROTEIN ELECTROPHORESIS, SERUM
A/G Ratio: 1.8 — ABNORMAL HIGH (ref 0.7–1.7)
ALPHA-2-GLOBULIN: 0.6 g/dL (ref 0.4–1.0)
Albumin ELP: 3.8 g/dL (ref 2.9–4.4)
Alpha-1-Globulin: 0.3 g/dL (ref 0.0–0.4)
BETA GLOBULIN: 0.7 g/dL (ref 0.7–1.3)
GLOBULIN, TOTAL: 2.1 g/dL — AB (ref 2.2–3.9)
Gamma Globulin: 0.5 g/dL (ref 0.4–1.8)
M-SPIKE, %: 0.2 g/dL — AB
Total Protein ELP: 5.9 g/dL — ABNORMAL LOW (ref 6.0–8.5)

## 2018-02-27 LAB — KAPPA/LAMBDA LIGHT CHAINS
KAPPA FREE LGHT CHN: 12.7 mg/L (ref 3.3–19.4)
Kappa, lambda light chain ratio: 4.38 — ABNORMAL HIGH (ref 0.26–1.65)
LAMDA FREE LIGHT CHAINS: 2.9 mg/L — AB (ref 5.7–26.3)

## 2018-03-02 ENCOUNTER — Telehealth: Payer: Self-pay

## 2018-03-02 NOTE — Telephone Encounter (Signed)
Per 8/19 vm received. Sent a sch msg to [place] to verify the okay in order to r/s appointments that patient had canceled to 9/36. Lab, f/u, and inf.

## 2018-03-03 ENCOUNTER — Other Ambulatory Visit: Payer: Self-pay

## 2018-03-03 ENCOUNTER — Telehealth: Payer: Self-pay

## 2018-03-03 DIAGNOSIS — M25552 Pain in left hip: Secondary | ICD-10-CM | POA: Diagnosis not present

## 2018-03-03 DIAGNOSIS — C9000 Multiple myeloma not having achieved remission: Secondary | ICD-10-CM | POA: Diagnosis not present

## 2018-03-03 LAB — IFE, DARA-SPECIFIC, SERUM
IgA: <5 mg/dL — ABNORMAL LOW (ref 64–422)
IgG (Immunoglobin G), Serum: 587 mg/dL — ABNORMAL LOW (ref 700–1600)
IgM (Immunoglobulin M), Srm: 23 mg/dL — ABNORMAL LOW (ref 26–217)

## 2018-03-03 NOTE — Telephone Encounter (Signed)
Left voice message per Dr. Burr Medico to let her know her lab results, M-protein is unchanged, 0.2, no concerns.

## 2018-03-03 NOTE — Telephone Encounter (Signed)
-----   Message from [name], MD sent at 03/01/2018 10:36 PM EDT ----- Please let pt know her lab results, M-protein unchanged, 0.2, no concerns, thanks   [name]  03/01/2018

## 2018-03-04 ENCOUNTER — Ambulatory Visit: Payer: Medicare Other | Admitting: Family Medicine

## 2018-03-10 ENCOUNTER — Other Ambulatory Visit: Payer: Self-pay

## 2018-03-10 ENCOUNTER — Ambulatory Visit (INDEPENDENT_AMBULATORY_CARE_PROVIDER_SITE_OTHER): Payer: Medicare Other | Admitting: Family Medicine

## 2018-03-10 ENCOUNTER — Telehealth: Payer: Self-pay | Admitting: Hematology

## 2018-03-10 ENCOUNTER — Encounter: Payer: Self-pay | Admitting: Family Medicine

## 2018-03-10 DIAGNOSIS — S32599D Other specified fracture of unspecified pubis, subsequent encounter for fracture with routine healing: Secondary | ICD-10-CM

## 2018-03-10 MED ORDER — ZOSTER VAC RECOMB ADJUVANTED 50 MCG/0.5ML IM SUSR: 0.5000 mL | Freq: Once | INTRAMUSCULAR | 1 refills | Status: AC

## 2018-03-10 NOTE — Patient Instructions (Signed)
You look great. Have a fun trip. Get the shingrix when you come back.  Remember, clear you calendar of 3-4 days just in case bad side effects. Let me know when you get it and the flu so that I can update your records. Don't waste time seeing me except every 6-12 months unless you have problems.

## 2018-03-10 NOTE — Telephone Encounter (Signed)
Patient has been added to infusion log for treatment 9/3 per 8/15 los

## 2018-03-11 ENCOUNTER — Ambulatory Visit: Payer: Medicare Other | Admitting: Hematology

## 2018-03-11 ENCOUNTER — Ambulatory Visit: Payer: Medicare Other

## 2018-03-11 ENCOUNTER — Other Ambulatory Visit: Payer: Medicare Other

## 2018-03-11 ENCOUNTER — Encounter: Payer: Self-pay | Admitting: Family Medicine

## 2018-03-11 DIAGNOSIS — H01021 Squamous blepharitis right upper eyelid: Secondary | ICD-10-CM | POA: Diagnosis not present

## 2018-03-11 DIAGNOSIS — H01024 Squamous blepharitis left upper eyelid: Secondary | ICD-10-CM | POA: Diagnosis not present

## 2018-03-11 DIAGNOSIS — H4423 Degenerative myopia, bilateral: Secondary | ICD-10-CM | POA: Diagnosis not present

## 2018-03-11 DIAGNOSIS — H01022 Squamous blepharitis right lower eyelid: Secondary | ICD-10-CM | POA: Diagnosis not present

## 2018-03-11 DIAGNOSIS — H01025 Squamous blepharitis left lower eyelid: Secondary | ICD-10-CM | POA: Diagnosis not present

## 2018-03-11 NOTE — Progress Notes (Signed)
   Subjective:    Patient ID: Shannon Rush, female    DOB: 01-05-1941, 77 y.o.   MRN: [id number]  HPI  FU hospitalization for fall with pelvic fracture.  Issues:  1. Fall, tripped by the leash of her dog, fell and fractured pelvis.  She has ortho follow up.  History is clear, this was not syncope, lightheadedness, or vertigo.  She is recovering slowly.  She road her bicycle to the office visit today.  She has known osteoporosis.   2. FU pneumonia.  Last spring had unusual back to back pneumonia.  She has had no fever, chills, SOB or choking with food.  Supports previous conclusion that she was unlucky. 3. HPDP: up to date.  Needs flu vaccine when available.  She intends to get the shingrix vaccine.   4. Multiple myeloma.  Followed by onc.  Current meds, while not curative, have held disease nicely at [facility] for 2-3 years.    Review of Systems     Objective:   Physical Exam VS wt is a touch lower than her baseline. Gait is normal Cardiac RRR without m or g Lungs clear.        Assessment & Plan:

## 2018-03-11 NOTE — Assessment & Plan Note (Signed)
Recovering nicely.  I do not feel that she needs further workup.  Already in physical therapy.

## 2018-03-12 DIAGNOSIS — C9000 Multiple myeloma not having achieved remission: Secondary | ICD-10-CM | POA: Diagnosis not present

## 2018-03-13 ENCOUNTER — Encounter: Payer: Self-pay | Admitting: Physician Assistant

## 2018-03-13 ENCOUNTER — Telehealth: Payer: Self-pay | Admitting: Hematology

## 2018-03-13 DIAGNOSIS — M25562 Pain in left knee: Secondary | ICD-10-CM | POA: Diagnosis not present

## 2018-03-13 DIAGNOSIS — M25551 Pain in right hip: Secondary | ICD-10-CM | POA: Diagnosis not present

## 2018-03-13 NOTE — Telephone Encounter (Signed)
Appts added per 8/28 staff message/ spoke with patients spouse/ IB message to Dr Burr Medico regarding f/u appt needed with treatment on 9/3

## 2018-03-13 NOTE — Progress Notes (Signed)
Cardiology Office Note    Date:  03/17/2018  ID:  Shannon Rush, DOB Jun 27, 1941, MRN [id number] PCP:  [name], MD  Cardiologist:  Ena Dawley, MD   Chief Complaint: routine f/u CHF, CAD  History of Present Illness:  Shannon Rush is a 77 y.o. female with history of multiple myeloma s/p bone marrow transplant (followed at [facility] by Dr. [name] and in [facility] by Dr. Burr Medico), osteoporosis, HLD, CAD (inferior STEMI s/p thrombolytics 02/2014), chronic combined CHF (?chemo induced) who presents to clinic for post hospital follow up.  She was originally seen by Dr. Johnsie Cancel in 12/2012. There were reports of an abnormal echocardiogram. 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 She was admitted 02/2014 to the [facility] with an inferior STEMI. She received thrombolytics. Cardiac catheterization demonstrated 60% proximal RCA, mid to distal RCA 50% (lesion segmental and thrombotic). Echocardiogram demonstrated normal LV function. No intervention was performed. Recommendation was for the patient to return to [facility] and have a nuclear stress test in 6 weeks. I do not see this was ordered after she established care in our practice. Patient was on Revlimid (for pancytopenia from chemotherapy) and the DC summary from the hospital in [place] cited this as a possible cause for her STEMI.  She has a known hx of multiple myeloma since 2013 treated with Revlimid, subsquently switched to pomalidomide, dexamethasone, daratumumab with resultant pancytopenia. She was admitted from 02/2016 for sepsis and acute respiratory failure 2/2 HCAP with acute on chronic systolic CHF with pleural effusions. The pt was shocky and required IVF, transfer to ICU, and IV pressors. She was noted to be thrombocytopenic and her Plavix was stopped. She had persistent hypoxia.Chest CT 03/24/16 showed moderate bilateral effusions. She was  given Lasix but this has been stopped secondary to hypotension.Echo done 03/24/16 showed an EF of 45-50% with grade 2 DD. She required bilateral thoracocentesis and IV diuresis. She also received blood transfusions with symptomatic improvement. She was discharged on 64m Lasix PRN. Dr. NMeda Coffeehas recommended q3 month echocardiograms while on chemotherapy. 2D echo 06/2016 showed EF down to 25-30% and a highly mobile echodensity attached to the aortic sideof the left coronary leaflet measuring 1 x 0.7 x 0.4 cm. TEE was suggested but Dr. NMeda Coffeereviewed and felt no TEE was necessary as this possibly represented the Lambl's excrescence seen before. She was on Velcade and Zometa at one point. Regimen was eventually changed to Dara/dexa every other week since 03/2017 and she is tolerating it well. Most recent echo 06/2017 showed EF 55-60%, grade 1 DD, trivial mitral valve prolapse involving anterior leaflet, mild LAE/RAE. She was admitted with bilateral pneumonia in 12/2017 and sustained a pelvic fracture in 01/2018. Last labs 02/2018 showed K 3.8, Cr 0.77, albumin 3.8, alk phos 145, ASTL/ALT OK, Hgb 10.7, plt 205; LDL in 12/2016 was 39. At last OV with Dr. NMeda Coffee she advised to decrease atorvastatin to 253mwhich the patient had done herself.  She returns for follow-up today feeling great from cardiac standpoint. Rode her bike here, a 30 min ride. No CP, SOB, LEE, orthopnea, syncope. She did not intentionally skip recommended f/u; other things came up in the meantime.   Past Medical History:  Diagnosis Date  . CAD (coronary artery disease)    a[place] >>> LHC (8/15- [facility]):  pRCA 60%, mid to dist RCA 50% >>>  med rx  . Cardiomyopathy ([place])   . Chronic combined systolic and diastolic CHF (congestive heart failure) ([place])   . Cord compression ([place]) 07/13/12   MRI- diffuse myeloma involvement of T-L spine  . History of radiation therapy 07/13/12-07/27/12   spinal  cord compression T3-T10,left scapula  . Hx of echocardiogram    Echo (03/02/14-done at [facility]):  Normal LVEF without significant regional wall motion abnormalities. Mild   . Multiple myeloma ([place]) 07/01/2012  . MVP (mitral valve prolapse)    a. trivial by echo 06/2017.  . OSTEOPOROSIS 06/11/2010   Multiple compression fractures; and spontaneous fracture of sternum Qualifier: Diagnosis of  By: [name], [name]     . Thoracic kyphosis 07/13/12   per MRI scan  . Unspecified deficiency anemia     Past Surgical History:  Procedure Laterality Date  . APPENDECTOMY    . CESAREAN SECTION     x2   . COLONOSCOPY  2007   neg with Dr. [name]  . ELBOW SURGERY    . HIP SURGERY  2009   left  . TUBAL LIGATION      Current Medications: Current Meds  Medication Sig  . acetaminophen (TYLENOL) 500 MG tablet Take 500 mg by mouth every 6 (six) hours as needed for moderate pain or fever.  Marland Kitchen acyclovir (ZOVIRAX) 400 MG tablet Take 1 tablet (400 mg total) by mouth 2 (two) times daily.  Marland Kitchen atorvastatin (LIPITOR) 20 MG tablet Take 1 tablet (20 mg total) by mouth daily.  . B Complex-C (SUPER B COMPLEX PO) Take 1 tablet by mouth daily.  . calcium carbonate (OS-CAL) 600 MG TABS tablet Take 600 mg by mouth daily.  Marland Kitchen dexamethasone (DECADRON) 4 MG tablet Take 5 tab the day before chemo, every 2 weeks  . metoprolol succinate (TOPROL-XL) 25 MG 24 hr tablet Take 1 tablet (25 mg total) by mouth daily.  . Multiple Vitamin (MULTIVITAMIN WITH MINERALS) TABS tablet Take 1 tablet by mouth daily.  . polyethylene glycol (MIRALAX) packet Take 17 g by mouth daily as needed.      Allergies:   Zithromax [azithromycin]; Zosyn [piperacillin sod-tazobactam so]; and Quinolones   Social History   Socioeconomic History  . Marital status: Married    Spouse name: Fritz Pickerel  . Number of children: 2  . Years of education: Not on file  . Highest education level: Not on file  Occupational History  .  Occupation: Museum/gallery conservator: [employer]  . Financial resource strain: Not on file  . Food insecurity:    Worry: Not on file    Inability: Not on file  . Transportation needs:    Medical: Not on file    Non-medical: Not on file  Tobacco Use  . Smoking status: Never Smoker  . Smokeless tobacco: Never Used  Substance and Sexual Activity  . Alcohol use: Yes    Alcohol/week: 7.0 standard drinks    Types: 7 Standard drinks or equivalent per week    Comment: 1 glass of wine or beer 3-5 days per week  . Drug use: No  . Sexual activity: Yes  Lifestyle  . Physical activity:    Days per week: Not on file    Minutes per session: Not on file  . Stress: Not on file  Relationships  . Social connections:    Talks on phone: Not on file    Gets together: Not on file    Attends  religious service: Not on file    Active member of club or organization: Not on file    Attends meetings of clubs or organizations: Not on file    Relationship status: Not on file  Other Topics Concern  . Not on file  Social History Narrative   Health Care POA:    Emergency Contact: spouse, Ellinore Merced [phone]   End of Life Plan:    Who lives with you: spouse, 2 story home   Any pets: Dog, "AKU"   Diet: Patient has a varied diet of protein, vegetables, starches.  Limits red meats.   Exercise: Patient exercises atleast 3 times a week for over an hour.   Seatbelts: Patient reports wearing her seatbelt when in vehicle.    Nancy Fetter Exposure/Protection: Patient reports wearing sunscreen daily.   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 Ducatte, RN, BSN                                                                                                         Family History:  The patient's family history includes Cancer in her brother; Glaucoma in her father and mother; Heart disease in her father and mother; Heart failure in her father; Peripheral vascular disease in her mother; Raynaud syndrome in her daughter, daughter, and mother.  ROS:   Please see the history of present illness.   All other systems are reviewed and otherwise negative.    PHYSICAL EXAM:   VS:  BP 110/62   Pulse 90   Ht _0  (1.702 m)   Wt 116 lb 1.9 oz (52.7 kg)   SpO2 96%   BMI 18.19 kg/m   BMI: Body mass index is 18.19 kg/m. GEN: Well nourished, well developed thin WF, in no acute distress HEENT: normocephalic, atraumatic Neck: no JVD, carotid bruits, or masses Cardiac: RRR; no murmurs, rubs, or gallops, minimal BLE edema  Respiratory:  clear to auscultation bilaterally, normal work of breathing GI: soft, nontender, nondistended, + BS MS: no deformity or atrophy Skin: warm and dry, no rash Neuro:  Alert and Oriented x 3, Strength and sensation are intact, follows commands Psych: euthymic mood, full affect  Wt Readings from Last 3 Encounters:  03/17/18 116 lb 1.9 oz (52.7 kg)  03/10/18 114 lb 12.8 oz (52.1 kg)  01/26/18 112 lb (50.8 kg)      Studies/Labs Reviewed:   EKG:  EKG was ordered today and personally reviewed by me and demonstrates NSR  90 bpm, chronic appearing STT changes II, III, avF, V3-V6 - no change from prior.  Recent Labs: 12/30/2017: B Natriuretic Peptide 176.4 02/26/2018: ALT 13; BUN 18; Creatinine, Ser 0.77; Hemoglobin 10.7; Platelet Count 205;  Potassium 3.8; Sodium 140   Lipid Panel    Component Value Date/Time   CHOL 121 12/31/2016 1543   TRIG 100 12/31/2016 1543   HDL 62 12/31/2016 1543   CHOLHDL 2.0 12/31/2016 1543   CHOLHDL 2.8 Ratio 06/02/2009 2018   VLDL 14 06/02/2009 2018   LDLCALC 39 12/31/2016 1543    Additional studies/ records that were reviewed today include: Summarized above   ASSESSMENT & PLAN:   1. CAD - no recent anginal symptoms. She remains on metoprolol and statin. Aspirin seems to have fallen off her list earlier this year, was not on it with recent discharge summaries. I'll reach out to Dr. Meda Coffee to get her thoughts in context of recent fracture and MM. She is due for lipid check. She will be having bloodwork today with oncology. She will ask Dr. Burr Medico if it's something that can be added on when she has her draw. She does not have DM so a non-fasting value would be acceptable. 2. Chronic combined CHF - appears euvolemic. PCP stopped lisinopril due to low BP.  3. Intermittent cardiomyopathy - f/u echocardiogram. It appears this was previously recommended q3 months but has not really occurred that way. If stable I will reach out to Dr. Meda Coffee to find out if that changes the recommended intervals. She remains on Dara/Dexa for now. 4. Mitral valve prolapse - recheck with echo. No sig murmur on exam.  Disposition: F/u with Dr. Meda Coffee in 6 months, contingent on echo result.   Medication Adjustments/Labs and Tests Ordered: Current medicines are reviewed at length with the patient today.  Concerns regarding medicines are outlined above. Medication changes, Labs and Tests ordered today are summarized above and listed in the Patient Instructions accessible in Encounters.   Signed, Charlie Pitter, PA-C  03/17/2018 9:00 AM    [address], [place], [place]  [address] Phone: [phone]; Fax: [phone]

## 2018-03-17 ENCOUNTER — Ambulatory Visit: Payer: Medicare Other | Admitting: Physician Assistant

## 2018-03-17 ENCOUNTER — Ambulatory Visit (HOSPITAL_BASED_OUTPATIENT_CLINIC_OR_DEPARTMENT_OTHER): Payer: Medicare Other | Admitting: Hematology

## 2018-03-17 ENCOUNTER — Encounter: Payer: Self-pay | Admitting: Hematology

## 2018-03-17 ENCOUNTER — Inpatient Hospital Stay: Payer: Medicare Other

## 2018-03-17 ENCOUNTER — Inpatient Hospital Stay: Payer: Medicare Other | Attending: Hematology

## 2018-03-17 ENCOUNTER — Encounter: Payer: Self-pay | Admitting: Physician Assistant

## 2018-03-17 VITALS — BP 110/62 | HR 90 | Ht 67.0 in | Wt 116.1 lb

## 2018-03-17 VITALS — BP 125/78 | HR 74 | Temp 98.1°F | Resp 17

## 2018-03-17 DIAGNOSIS — Z8701 Personal history of pneumonia (recurrent): Secondary | ICD-10-CM

## 2018-03-17 DIAGNOSIS — I2510 Atherosclerotic heart disease of native coronary artery without angina pectoris: Secondary | ICD-10-CM | POA: Diagnosis not present

## 2018-03-17 DIAGNOSIS — I5042 Chronic combined systolic (congestive) and diastolic (congestive) heart failure: Secondary | ICD-10-CM | POA: Insufficient documentation

## 2018-03-17 DIAGNOSIS — Z923 Personal history of irradiation: Secondary | ICD-10-CM | POA: Diagnosis not present

## 2018-03-17 DIAGNOSIS — I252 Old myocardial infarction: Secondary | ICD-10-CM | POA: Diagnosis not present

## 2018-03-17 DIAGNOSIS — Z5112 Encounter for antineoplastic immunotherapy: Secondary | ICD-10-CM | POA: Diagnosis not present

## 2018-03-17 DIAGNOSIS — Z9484 Stem cells transplant status: Secondary | ICD-10-CM

## 2018-03-17 DIAGNOSIS — I429 Cardiomyopathy, unspecified: Secondary | ICD-10-CM

## 2018-03-17 DIAGNOSIS — J189 Pneumonia, unspecified organism: Secondary | ICD-10-CM | POA: Insufficient documentation

## 2018-03-17 DIAGNOSIS — Z79899 Other long term (current) drug therapy: Secondary | ICD-10-CM | POA: Diagnosis not present

## 2018-03-17 DIAGNOSIS — D61818 Other pancytopenia: Secondary | ICD-10-CM | POA: Diagnosis not present

## 2018-03-17 DIAGNOSIS — C9002 Multiple myeloma in relapse: Secondary | ICD-10-CM | POA: Diagnosis not present

## 2018-03-17 DIAGNOSIS — G620 Drug-induced polyneuropathy: Secondary | ICD-10-CM | POA: Insufficient documentation

## 2018-03-17 DIAGNOSIS — R05 Cough: Secondary | ICD-10-CM | POA: Diagnosis not present

## 2018-03-17 DIAGNOSIS — T451X5S Adverse effect of antineoplastic and immunosuppressive drugs, sequela: Secondary | ICD-10-CM

## 2018-03-17 DIAGNOSIS — R5383 Other fatigue: Secondary | ICD-10-CM | POA: Insufficient documentation

## 2018-03-17 DIAGNOSIS — C9001 Multiple myeloma in remission: Secondary | ICD-10-CM

## 2018-03-17 DIAGNOSIS — M810 Age-related osteoporosis without current pathological fracture: Secondary | ICD-10-CM | POA: Insufficient documentation

## 2018-03-17 DIAGNOSIS — I341 Nonrheumatic mitral (valve) prolapse: Secondary | ICD-10-CM | POA: Diagnosis not present

## 2018-03-17 DIAGNOSIS — Z9221 Personal history of antineoplastic chemotherapy: Secondary | ICD-10-CM | POA: Insufficient documentation

## 2018-03-17 DIAGNOSIS — C9000 Multiple myeloma not having achieved remission: Secondary | ICD-10-CM

## 2018-03-17 LAB — CBC WITH DIFFERENTIAL (CANCER CENTER ONLY)
BASOS PCT: 0 %
Basophils Absolute: 0 10*3/uL (ref 0.0–0.1)
EOS ABS: 0 10*3/uL (ref 0.0–0.5)
Eosinophils Relative: 0 %
HEMATOCRIT: 32.5 % — AB (ref 34.8–46.6)
HEMOGLOBIN: 10.5 g/dL — AB (ref 11.6–15.9)
LYMPHS ABS: 0.2 10*3/uL — AB (ref 0.9–3.3)
Lymphocytes Relative: 4 %
MCH: 33.2 pg (ref 25.1–34.0)
MCHC: 32.3 g/dL (ref 31.5–36.0)
MCV: 102.8 fL — ABNORMAL HIGH (ref 79.5–101.0)
Monocytes Absolute: 0.3 10*3/uL (ref 0.1–0.9)
Monocytes Relative: 5 %
NEUTROS ABS: 5 10*3/uL (ref 1.5–6.5)
Neutrophils Relative %: 91 %
Platelet Count: 176 10*3/uL (ref 145–400)
RBC: 3.16 MIL/uL — ABNORMAL LOW (ref 3.70–5.45)
RDW: 16.5 % — ABNORMAL HIGH (ref 11.2–14.5)
WBC: 5.6 10*3/uL (ref 3.9–10.3)

## 2018-03-17 LAB — COMPREHENSIVE METABOLIC PANEL
ALBUMIN: 3.9 g/dL (ref 3.5–5.0)
ALK PHOS: 96 U/L (ref 38–126)
ALT: 14 U/L (ref 0–44)
ANION GAP: 9 (ref 5–15)
AST: 20 U/L (ref 15–41)
BUN: 20 mg/dL (ref 8–23)
CO2: 25 mmol/L (ref 22–32)
Calcium: 9.3 mg/dL (ref 8.9–10.3)
Chloride: 105 mmol/L (ref 98–111)
Creatinine, Ser: 0.75 mg/dL (ref 0.44–1.00)
GFR calc Af Amer: >60 mL/min (ref >60)
GFR calc non Af Amer: >60 mL/min (ref >60)
GLUCOSE: 100 mg/dL — AB (ref 70–99)
POTASSIUM: 4.2 mmol/L (ref 3.5–5.1)
SODIUM: 139 mmol/L (ref 135–145)
Total Bilirubin: 0.6 mg/dL (ref 0.3–1.2)
Total Protein: 6.3 g/dL — ABNORMAL LOW (ref 6.5–8.1)

## 2018-03-17 MED ORDER — DIPHENHYDRAMINE HCL 25 MG PO CAPS
50.0000 mg | ORAL_CAPSULE | Freq: Once | ORAL | Status: AC
  Administered 2018-03-17: 50 mg via ORAL

## 2018-03-17 MED ORDER — MONTELUKAST SODIUM 10 MG PO TABS
ORAL_TABLET | ORAL | Status: AC
  Filled 2018-03-17: qty 1

## 2018-03-17 MED ORDER — METHYLPREDNISOLONE SODIUM SUCC 125 MG IJ SOLR
125.0000 mg | Freq: Once | INTRAMUSCULAR | Status: AC
  Administered 2018-03-17: 125 mg via INTRAVENOUS

## 2018-03-17 MED ORDER — METHYLPREDNISOLONE SODIUM SUCC 125 MG IJ SOLR
INTRAMUSCULAR | Status: AC
  Filled 2018-03-17: qty 2

## 2018-03-17 MED ORDER — SODIUM CHLORIDE 0.9 % IV SOLN
Freq: Once | INTRAVENOUS | Status: AC
  Administered 2018-03-17: 15:00:00 via INTRAVENOUS
  Filled 2018-03-17: qty 250

## 2018-03-17 MED ORDER — DIPHENHYDRAMINE HCL 25 MG PO CAPS
ORAL_CAPSULE | ORAL | Status: AC
  Filled 2018-03-17: qty 2

## 2018-03-17 MED ORDER — SODIUM CHLORIDE 0.9 % IV SOLN
16.0000 mg/kg | Freq: Once | INTRAVENOUS | Status: AC
  Administered 2018-03-17: 900 mg via INTRAVENOUS
  Filled 2018-03-17: qty 40

## 2018-03-17 MED ORDER — ACETAMINOPHEN 325 MG PO TABS
ORAL_TABLET | ORAL | Status: AC
  Filled 2018-03-17: qty 2

## 2018-03-17 MED ORDER — MONTELUKAST SODIUM 10 MG PO TABS
10.0000 mg | ORAL_TABLET | Freq: Once | ORAL | Status: AC
  Administered 2018-03-17: 10 mg via ORAL

## 2018-03-17 MED ORDER — ACETAMINOPHEN 325 MG PO TABS
650.0000 mg | ORAL_TABLET | Freq: Once | ORAL | Status: AC
  Administered 2018-03-17: 650 mg via ORAL

## 2018-03-17 NOTE — Patient Instructions (Addendum)
Medication Instructions:  Your physician recommends that you continue on your current medications as directed. Please refer to the Current Medication list given to you today.   Labwork: None ordered  Testing/Procedures: Your physician has requested that you have an echocardiogram. Echocardiography is a painless test that uses sound waves to create images of your heart. It provides your doctor with information about the size and shape of your heart and how well your heart's chambers and valves are working. This procedure takes approximately one hour. There are no restrictions for this procedure.   Follow-Up: Your physician wants you to follow-up in: 6 MONTHS WITH DR. Johann Capers will receive a reminder letter in the mail two months in advance. If you don't receive a letter, please call our office to schedule the follow-up appointment.    Any Other Special Instructions Will Be Listed Below (If Applicable).  PLEASE TALK WITH DR. FENG ABOUT UPDATING YOUR CHOLESTEROL PROFILE WHEN SHE ORDERS YOUR BLOOD WORK NEXT TIME.    Echocardiogram An echocardiogram, or echocardiography, uses sound waves (ultrasound) to produce an image of your heart. The echocardiogram is simple, painless, obtained within a short period of time, and offers valuable information to your health care provider. The images from an echocardiogram can provide information such as:  Evidence of coronary artery disease (CAD).  Heart size.  Heart muscle function.  Heart valve function.  Aneurysm detection.  Evidence of a past heart attack.  Fluid buildup around the heart.  Heart muscle thickening.  Assess heart valve function.  Tell a health care provider about:  Any allergies you have.  All medicines you are taking, including vitamins, herbs, eye drops, creams, and over-the-counter medicines.  Any problems you or family members have had with anesthetic medicines.  Any blood disorders you have.  Any surgeries  you have had.  Any medical conditions you have.  Whether you are pregnant or may be pregnant. What happens before the procedure? No special preparation is needed. Eat and drink normally. What happens during the procedure?  In order to produce an image of your heart, gel will be applied to your chest and a wand-like tool (transducer) will be moved over your chest. The gel will help transmit the sound waves from the transducer. The sound waves will harmlessly bounce off your heart to allow the heart images to be captured in real-time motion. These images will then be recorded.  You may need an IV to receive a medicine that improves the quality of the pictures. What happens after the procedure? You may return to your normal schedule including diet, activities, and medicines, unless your health care provider tells you otherwise. This information is not intended to replace advice given to you by your health care provider. Make sure you discuss any questions you have with your health care provider. Document Released: 06/28/2000 Document Revised: 02/17/2016 Document Reviewed: 03/08/2013 Elsevier Interactive Patient Education  2017 [employer].     If you need a refill on your cardiac medications before your next appointment, please call your pharmacy.

## 2018-03-17 NOTE — Progress Notes (Signed)
[facility] ONCOLOGY OFFICE PROGRESS NOTE  PCP: [name], MD [place] [address]  Date of Service:  03/17/2018   DIAGNOSIS: Multiple myeloma with relapse  CHIEF COMPLAINTS: F/u of MM   Oncology History   Multiple myeloma ([place])   Staging form: Multiple Myeloma, AJCC 6th Edition   - Clinical: Stage IIIA - Signed by [name], MD on 09/04/2013      Multiple myeloma ([place])   05/26/2012 Initial Diagnosis    Presenting IgG was 4,040 mg/dL on 06/05/2012 (IgA 35; IgM 34); kappa free light chain 34.4 mg/dL, lambda 0.00, kappa:lambda ratio of 34.75; SPEP with M-spike of 2.60.    06/30/2012 Imaging    Numerous lytic lesions throughout the calvarium.  Lytic lesion within the left lateral scapula.  Findings most compatible with metastases or myeloma. Multiple mid and lower thoracic compression fractures.  Slight compression through the endplates at L3.    50/03/3817 Bone Marrow Biopsy    Bone marrow biopsy showed 49% plasma cell. Normal classical cytogenetics; however, myeloma FISH panel showed 13q- (intermediate risk)        07/14/2012 - 07/27/2012 Radiation Therapy    Palliative radiation 20 Gy over 10 fractions between to thoracic spine cord compression and symptomatic left scapula lytic lesion.    08/10/2012 - 11/2012 Chemotherapy    Started SQ Velcade once weekly, 3 weeks on, 1 weeks off; daily Revlimid d1-21, 7 days off; and Dexamethasone 36m PO weekly.     02/12/2013 Bone Marrow Transplant    [facility]     06/14/2013 Tumor Marker    IgG  1830,  Kappa:lamba ratio, 1.69 (baseline was 34.75 or   M-spike 0.28 (baseline of 2.6 or  89.3% of baseline).      06/25/2013 -  Chemotherapy    Started zometa 3.5 mg monthly     09/01/2013 - 01/2014 Chemotherapy    Maintenance therapy with revlimid 5527mdaily for 14 days and then off for 7 (decreased from 21 days on and 7 days off based on neutropenia). Stopped due to disease  progression 01/2017    09/13/2013 - 09/17/2014 Hospital Admission    Hospital admission for pneumonia    12/20/2013 Treatment Plan Change    Maintenance therapy decreased to 2.5 mg daily for 21 days and then off for 7 days based on low counts.     01/25/2014 Tumor Marker    IgG 1360 M spike 0.5    03/01/2014 - 03/05/2014 Hospital Admission    [facility], received thrombolytic therapy, cath showed 60% prox RCA, mid-distal RCA 50%, Echo normal, no stents.    04/11/2014 - 08/07/2014 Chemotherapy    Continue Revlimid at 2.5 mg 3 weeks on 1 week off    08/08/2014 - 08/13/2014 Hospital Admission    Hospital admission for pneumonia. Her Revlimid was held.    08/29/2014 - 09/14/2014 Chemotherapy    Maintenance Revlimid restarted    09/14/2014 - 09/17/2014 Hospital Admission    [place]    11/13/2014 - 12/25/2015 Chemotherapy    Maintenance Revlimid restarted, changed to 2.27m34maily, 2 weks on, 1 week off from 12/15/2014, stopped due to disease progression     01/24/2016 Progression    Patient is M protein has gradually increased to 1.0g, repeated a bone marrow biopsy showed plasma cell 10-30%  02/27/2016 - 03/07/2016 Chemotherapy    Pomalidomide 4 mg daily on day 1-21, dexamethasone 40 mg on day 1, 8 and 15, every 28 days, started on 02/27/2016, daratumumab weekly started on 03/07/2016, held after first dose, due to hospitalization and a severe pancytopenia, pomalidomide was stopped afterwards     03/10/2016 - 03/29/2016 Hospital Admission    Pt was admitted for sepsis from pneumonia, and severe pancytopenia. She required ICU stay for a few days due to hypotension, developed b/l pleural effusion required diuretics and b/l thoracentesis. She also required blood and plt transfusion, and prolonged neupogen injection for severe neutropenia. She was discharged home after 3 weeks  hospital stay     04/26/2016 -  Chemotherapy    Daratumumab restarted on 04/26/2016, Velcade 1.82m/m2 and dexa 286mweekly start on 11/3, velcade held since 06/28/2016 due to CHF     08/26/2016 Echocardiogram    - Left ventricle: LVEF is approximately 35% with diffuse diffuse   hypokinesis with severe hypokinesis/ akinesis of the   inferior/inferoseptal walls. In comparison to echo images from   December 2017, there does not appear to be significant change The   cavity size was normal. Wall thickness was normal. Doppler   parameters are consistent with abnormal left ventricular   relaxation (grade 1 diastolic dysfunction). - Aortic valve: AV is thickened. 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 Present in echo from December 2017 There was   trivial regurgitation. - Right ventricle: Systolic function was mildly reduced. - Right atrium: The atrium was mildly dilated. - Pericardium, extracardiac: A trivial pericardial effusion was   identified.    12/03/2016 Echocardiogram    -Left Ventricle: Systolic function was   mildly to moderately reduced. The estimated ejection fraction was   in the range of 40% to 45%. Diffuse hypokinesis. Doppler   parameters are consistent with abnormal left ventricular   relaxation (grade 1 diastolic dysfunction). Doppler parameters   are consistent with indeterminate ventricular filling pressure. - Left atrium: The atrium was severely dilated. - Tricuspid valve: There was trivial regurgitation. - Pulmonary arteries: Systolic pressure was within the normal   range. PA peak pressure: 33 mm Hg (S). - Global longitudinal strain -10.5% (abnormal)    03/12/2017 Echocardiogram    ECHO 03/12/17 Study Conclusions - Left ventricle: The cavity size was normal. Wall thickness was   increased in a pattern of mild LVH. Systolic function was normal.   The estimated ejection fraction was in the range of 50%  to 55%.   There is hypokinesis of the basalinferior myocardium. Doppler   parameters are consistent with abnormal left ventricular   relaxation (grade 1 diastolic dysfunction). - Aortic valve: There was trivial regurgitation. - Mitral valve: Calcified annulus. Mildly thickened leaflets .   There was trivial regurgitation. - Tricuspid valve: There was mild regurgitation. Impressions: - There has been mild improvement in EF since prior study.     12/12/2017 - 12/15/2017 Hospital Admission    Admission diagnosis: Pnuemonia and acute hypoxic respiratory failure Additional comments: treated for pneumoania and acute hypoxic respiratory failure before she was discharged on 12/15/17 with oral anitbiotics. She did not require home oxygen.     12/30/2017 - 01/02/2018 Hospital Admission    Admission diagnosis: Pnuemonia  Additional comments: She was again hospitalized on 12/30/17 for pneumonia, ID work up was otherwise negative, she was discharged home with oral antibiotics.       CURRENT TREATMENT:  1. Pomalidomide 4 mg daily on day 1-21, dexamethasone 40on day 1, 8 and 15, every 28 days, started on 02/27/2016, daratumumab weekly started on 03/07/2016, treatment on hold since 03/09/2016 due to hospitalization and severe cytopenia 2. Daratumumab restarted on 04/26/2016, Velcade 1.103m/m2 and dexa 263mweekly start on 11/3, velcade held since 06/28/2016 due to CHF. DaTonette Biharias changed from every 4 weeks to every 2 weeks on 03/28/2017 per Dr. GaKendell Baneecommendation. Held after 11/21/17 treatment due to recurrent pnuemonia, restarted 01/09/18 at every 3-4 weeks.  INTERVAL HISTORY:  ClMONQUE HAGGAR89.o. female here for a follow up. She is here at the infusion room alone. She is doing well. She had a productive cough a while ago, from which she recovered. [place] She knows that she will need to wear masks and continue her antibiotics course.   MEDICAL HISTORY: Past Medical History:   Diagnosis Date  . CAD (coronary artery disease)    [place]  p[place] 60%, mid to dist RCA 50% >>> med rx  . Cardiomyopathy ([place]  . Chronic combined systolic and diastolic CHF (congestive heart failure) ([place]  . [place]   MRI- diffuse myeloma involvement of T-L spine  . History of radiation therapy 07/13/12-07/27/12   spinal cord compression T3-T10,left scapula  . Hx of echocardiogram    [facility]  Normal LVEF without significant regional wall motion abnormalities. Mild   . Multiple myeloma ([place]/18/2013  . MVP (mitral valve prolapse)    a. trivial by echo 06/2017.  . OSTEOPOROSIS 06/11/2010   Multiple compression fractures; and spontaneous fracture of sternum Qualifier: Diagnosis of  By: SaZebedee IbaP, SuManuela Schwartz   . Thoracic kyphosis 07/13/12   per MRI scan  . Unspecified deficiency anemia    ALLERGIES:  is allergic to zithromax [azithromycin]; zosyn [piperacillin sod-tazobactam so]; and quinolones.  SURGICAL HISTORY:  Past Surgical History:  Procedure Laterality Date  . APPENDECTOMY    . CESAREAN SECTION     x2   . COLONOSCOPY  2007   neg with Dr. MaWatt Climes. ELBOW SURGERY    . HIP SURGERY  2009   left  . TUBAL LIGATION     REVIEW OF SYSTEMS:   Constitutional: Denies abnormal weight loss  Eyes: Denies blurriness of vision  Ears, nose, mouth, throat, and face: Denies mucositis   Respiratory: Denies dyspnea or wheezes (+) cough, resolved Cardiovascular: Denies palpitation, chest discomfort  Gastrointestinal:  Denies heartburn   Skin: Denies abnormal skin rashes  Lymphatics: Denies new lymphadenopathy or easy bruising Neurological:Denies new weaknesses. (+) Positive for paraesthesias and numbness to the feet (baseline). Stable.  Behavioral/Psych: Mood is stable, no new changes  All other systems were reviewed with the patient and  are negative.   PHYSICAL EXAMINATION:   ECOG PERFORMANCE STATUS: 1 Blood pressure 118/75, heart rate 76, respirate 17 GENERAL:alert, no distress and comfortable; easily mobile to exam table; kyphosis, moderate.  SKIN: skin color, texture, turgor are normal, no rashes or significant lesions except a small healing, dry wound in the right lower leg above his ankle, with surrounding skin erythema and swelling EYES: normal, Conjunctiva are pink, 1 tear/injection blood vessel left eye in sclera. OROPHARYNX:no exudate, no erythema and lips, buccal mucosa, and tongue normal, no ONJ NECK: supple, thyroid normal size, non-tender, without nodularity; nodules cervical bilaterally.  LYMPH:  no palpable lymphadenopathy in the cervical, axillary or supraclavicular LUNGS: clear to precussion, no wheezing. (+) b/l fine crackles  HEART: regular rate & rhythm and no murmurs  ABDOMEN:abdomen soft, and normal bowel sounds  Musculoskeletal:no cyanosis of digits and no clubbing, NEURO: alert & oriented x 3 with fluent speech, no focal motor/sensory deficits   Labs:  CBC Latest Ref Rng & Units 03/17/2018 02/26/2018 01/29/2018  WBC 3.9 - 10.3 K/uL 5.6 4.4 6.7  Hemoglobin 11.6 - 15.9 g/dL 10.5(L) 10.7(L) 8.7(L)  Hematocrit 34.8 - 46.6 % 32.5(L) 32.7(L) 25.1(L)  Platelets 145 - 400 K/uL 176 205 218    CMP Latest Ref Rng & Units 03/17/2018 02/26/2018 01/29/2018  Glucose 70 - 99 mg/dL 100(H) 107(H) 116(H)  BUN 8 - 23 mg/dL _0 Creatinine 0.44 - 1.00 mg/dL 0.75 0.77 0.77  Sodium 135 - 145 mmol/L 139 140 131(L)  Potassium 3.5 - 5.1 mmol/L 4.2 3.8 4.0  Chloride 98 - 111 mmol/L 105 105 99  CO2 22 - 32 mmol/L _1 Calcium 8.9 - 10.3 mg/dL 9.3 9.1 8.8(L)  Total Protein 6.5 - 8.1 g/dL 6.3(L) 6.4(L) 5.9(L)  Total Bilirubin 0.3 - 1.2 mg/dL 0.6 0.6 0.8  Alkaline Phos 38 - 126 U/L 96 145(H) 64  AST 15 - 41 U/L _2 ALT 0 - 44 U/L _3 SPEP M-PROTEIN  10/01/2013: 0.18 (nadir)  10/13/2014:  0.6 11/21/2014: 0.5 01/30/2015: 0.6 03/13/2015: 0.6 04/20/2015: 0.5 06/01/2015: 0.5 07/20/2015: 0.7 09/14/2015: 0.7 10/26/2015: 0.8 12/07/2015: 1.0 02/07/2016: 1.1  04/03/2016: 0.2 05/03/2016: 0.2  05/31/2016: 0.1 06/28/2016: 0.1 07/25/2016: 0.1 08/21/2016: 0.1 09/27/2016: 0.1 10/23/16: 0.1 12/27/2016: 0.1 (Dara Specific IFE was still positive) 01/24/17: 0.1 02/28/17: 0.1 03/28/17: 0.1 04/25/17: 0.1 05/23/17: 0.1 06/27/17: 0.1 08/01/17: 0.1 2:15/19: 0.1 09/26/17: 0.2 10/24/17: 0.1 11/21/17: 0.2 01/09/18: 0.2 01/29/18: 0.2 02/26/2018: 0.2   IgG ([phone]) 10/13/2014: 1710 11/21/2014: 1460 01/30/2015: 1380 03/13/15: 1570 04/20/2015: 1550 06/01/2015: 1590 07/22/2015: 1770 09/14/2015: 1787 10/26/2015: 1898 12/07/2015: 2045 02/07/2016: 1883 04/03/2016: 787 05/03/2016: 685  05/31/2016: 544 06/28/2016: 463 07/25/2016: 455 08/21/2016: 426 09/27/2016: 438 10/23/16: 374 12/06/16: 370 12/27/16: 343, 346 01/24/17: 332  02/28/17: 362 03/28/17:  436 04/25/17: 431 05/23/17: 471 06/27/17: 429 08/01/17: 392 08/29/17: 409 09/26/17: 421 10/24/17: 438 11/21/17: 465 01/09/18: 756 01/29/18: 638  Serum kappa, lambda light chains, and ratio  11/21/2014: 3.95, 2.08, 1.90  01/30/2015: 3.92, 2.64, 1.48 03/13/2015: 3.55, 2.17, 1.64 04/20/15: 4.52, 1.78, 2.54 06/01/2015: 4.82, 1.75, 2.75 07/20/2015: 8.71, 2.43, 3.58 09/14/2015: 9.17, 2.02, 4.55 10/26/2015: 11.4, 1.75, 6.42 12/07/2015: 1.54, 1.83, 8.44 02/07/2016: 1.93, 1.42, 13.6 04/03/2016: 0.96, 0.95, 1.01  05/03/2016: 0.71, 0.34, 2.09  05/31/2016: 0.48, 0.26, 1.85 06/28/2016: 0.52, 0.60, 0.87 07/25/2016: 0.46, 0.27, 1.70 08/21/2016: 0.43, 0.27, 1.59 09/27/2016: 0.52, 0.24, 2.17 10/23/16: .0.49, 0.35, 1.40 12/06/16: 0.68, 0.29, 2.34 12/27/16: 0.54, 0.29, 1.86 02/28/17: 0.56, 0.30, 1.87 04/11/17: 0.61, 0.29, 2.10 05/09/17: 0.57, 0.33, 1.73 06/13/17: 0.65, 0.23, 2.83 07/18/17: 0.75 0.26, 2.83 08/15/17: 0.78, 0.27, 2.89 08/29/17: 0.79, 0.3, 2.63 09/26/17: 0.88, 0.26, 3.38  10/24/17:  0.95, 0.36, 2.64 11/21/17: 0.99, 0.24, 4.13 01/09/18: 1.79, 1.01, 1.77 01/29/18: 12.9, 6.4, 2.02 02/26/2018: 12.7, 2.9, 4.38   24 hr urine PEP (total protein, M-spike) 10/13/2014: (-) 02/12/2016: 253m, 668m9/22/2017: 114 mg, (-) 08/08/16: 99, (-) 12/27/16: 5946m(-) 03/28/17: 63m55m-) 01/22/18: not observed   PATHOLOGY REPORT  Diagnosis 01/24/2016 Bone Marrow, Aspirate,Biopsy, and Clot, RT iliac and core - VARIABLY CELLULAR BONE MARROW WITH  PLASMA CELL NEOPLASM. - SEE COMMENT. PERIPHERAL BLOOD: - MACROCYTIC ANEMIA.  Diagnosis Note The bone marrow is variably cellular with trilineage hematopoiesis and non specific myeloid changes. In this background, the plasma cell component is variable ranging from less than 10 % in many areas to 30% in some aspirate particles with relatively low cellularity. Foci of interstitial infiltrates and small clusters are also seen in the clot section. Immunohistochemical stains show kappa light chain restriction in atypical plasma cell clusters and infiltrates consistent with plasma cell neoplasm. Correlation with cytogenetic and FISH studies is recommended. (BNS:gt, February 14, 2016)  Diagnosis 03/28/16 PLEURAL FLUID, LEFT (SPECIMEN 1 OF 1 COLLECTED 03/28/16): REACTIVE MESOTHELIAL CELLS  RADIOGRAPHIC STUDIES:  06/26/2017 ECHO LV EF: 55% -   60%  ECHO 03/12/17 Study Conclusions - Left ventricle: The cavity size was normal. Wall thickness was   increased in a pattern of mild LVH. Systolic function was normal.   The estimated ejection fraction was in the range of 50% to 55%.   There is hypokinesis of the basalinferior myocardium. Doppler   parameters are consistent with abnormal left ventricular   relaxation (grade 1 diastolic dysfunction). - Aortic valve: There was trivial regurgitation. - Mitral valve: Calcified annulus. Mildly thickened leaflets .   There was trivial regurgitation. - Tricuspid valve: There was mild regurgitation. Impressions: - There has  been mild improvement in EF since prior study.    Echo 12/03/16 -Left Ventricle: Systolic function was   mildly to moderately reduced. The estimated ejection fraction was   in the range of 40% to 45%. Diffuse hypokinesis. Doppler  parameters are consistent with abnormal left ventricular relaxation (grade 1 diastolic dysfunction). Doppler parameters are consistent with indeterminate ventricular filling pressure. - Left atrium: The atrium was severely dilated. - Tricuspid valve: There was trivial regurgitation. - Pulmonary arteries: Systolic pressure was within the normal   range. PA peak pressure: 33 mm Hg (S). - Global longitudinal strain -10.5% (abnormal)  Echo 08/26/2016 - Left ventricle: LVEF is approximately 35% with diffuse hypokinesis with severe hypokinesis/ akinesis of the inferior/inferoseptal walls. In comparison to echo images from December 2017, there does not appear to be significant change The cavity size was normal. Wall thickness was normal. Doppler parameters are consistent with abnormal left ventricular relaxation (grade 1 diastolic dysfunction). - Aortic valve: AV is thickened. 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 Present in echo from December 2017 There was trivial regurgitation. - Right ventricle: Systolic function was mildly reduced. - Right atrium: The atrium was mildly dilated. - Pericardium, extracardiac: A trivial pericardial effusion was identified.  ASSESSMENT:   Shannon Rush 77 y.o. female with a history of Multiple Myeloma s/p autotransplant in August 2014 followed by maintenance low dose Revlimid therapy, and relapsed in 01/2016   1. IgG kappa Multiple myeloma s/p Auto SCT, intermediate risk, relapsed in 01/2016 --MM markers demonstrate a very good initial response (M protein baseline 2.6, down to 0.18 after induction chemo and transplant, stable around 0.5 since July 2015, but has gradually  increased lately),  she unfortunately relapsed in July 2017 -I previously discussed her repeated bone marrow biopsy from 02/01/2016, which showed 10-30% plasma cells in her marrow -I agreed with Dr. Kendell Bane recommendation to stop Revlimid and change to Daratumumab, Pomalidomide and dexamethasone due to her disease relapse. I don't think she has resistance to Revlimid, she has been on very small dose of Revlimid due to her prior history of multiple infections. -She started  first cycle DaraPD in 02/2016, but 2 weeks later she developed severe pancytopenia, pneumonia with sepsis required 3 weeks hospital stay. She did have a very nice response to the treatment, her M-protein has significantly reduced from 1.2 to 0.2, light chain levels normalized even with half cycle treatment  -I previously recommended her to change regiment to DaraVD, and started her on dara every 2 weeks for 2 doses, she tolerated this very well -she has had good response to treatment, M-protein significantly dropped -Her previous echo in December 2017 showed a significant drop on EF, she is not much symptomatically for CHF, due to the potential contribution of Velcade to her CHF, I'll hold her Velcade for now. Her repeated echo in May 2018 showed a significant improvement, EF 40-45%. -her dara-specific IFE in 12/2016 was still positive, she has low level M-protein, she has achieved a good partial response. -She hasn't had Velcade since December 2017. Will continue holding it for now due to her CHF -We previously discussed her ECHO from 02/2017 and 06/2017 that showed her heart function has normalized to EF of 50% to 55% and 55-60% respectively.  -She has seen Dr. [name] at [facility] who suggests she use a combination treatment of Dara + low dose Pomalidomide with and increase treatment to every 2 weeks.  -Due to her previous severe cytopenia and infection, and a prolonged excellent response to single agent Dara, I did not recommend her  to change to DaraPD again. We decided to change Dara/dexa treatment to every 2 weeks on 03/28/17, she has been tolerating well overall. Her M-protein level has been stable (0.1-0.2) since then  -If she has disease progression, I would consider adding Velcade back -Treatment was previously held 11/21/17-01/09/18 due to hospitalization from recurrent pneumonia. I previously discussed we will change her dura to every 3-4 weeks due to her recurrent infection.  -She has developed mild dry cough in the morning, afebrile, no leukocytosis, exam of lungs were unremarkable, no clinical concern for pneumonia -Labs reviewed, CBC showed Hg 10.5,  We will proceed daratumumab infusion today -I advised her to monitor her cough and check for fever. She is leaving tomorrow for a 2 weeks trip to [place], she will bring antibiotics doxycycline with her, she knows to start if she develops worsening productive cough, or fever. -F/u in 3 weeks  2. Recurrent pneumonia  -Presented after her cruise with productive cough with white-yellowish phlegm, congestion, upper abdominal soreness, diarrhea/watery stool, , nausea, sore throat, no appetite, weight loss, fever and headaches. -Previous exam showed bilateral rales and rhonchi. She desatted to 85% on room air when she walks, highly suspicious for b/l pneumonia. -She previously had multiple episodes of pneumonia, which required ICU stay and prolonged hospital stay. She is immunocompromised due to her treatment -After ED recommendation she was admitted on 12/12/17 and was treated for pneumonia and acute hypoxic respiratory failure before she was discharged on 12/15/17 with oral antibiotics.  -She was again hospitalized on 12/30/17 as her pneumonia did not resolve. I previously suggested ID consult and workup to rule out opportunistic infections as she is immunocompromised. She was discharged on 01/02/18. Her ID workup was negative. -She completed her antibiotics and is recovering well.  Still has mild productive cough. I suggest she take OTC muscinex to loosen her mucous.  -Resolved  3. Pancytopenia  -secondary to Dara, slightly worse after we increased Dara to every 2 weeks  -Continue monitoring closely. -WBC at 5.6, Hg at 10.5 Blood counts showed Abs lymphs  of 0.2.   4. CHF, CAD, inferior STEMI in 02/2014 -continue metoprolol and lipitor, Dr. Meda Coffee previously started her on lisinopril also -Dr. Meda Coffee plans to repeat her echo every 3 months while on treatment.  -I previously encouraged her to watch for leg swelling and SOB and chest pain as these are symptoms of her heart condition. -Her latest ECHO from 06/2017 shows improved heart function with EF of 55-60%.   5. Osteoporosis with multiple fractures  -previous bone density scan showed worsening osteoporosis, continue Zometa, changed to every 3 months after 03/2015.  -She declined repeating DEXA  -She was on Zometa for 4 years, the risk probably outweighs the benefit so we stopped it, last injection was 10/23/16 -she recently had pelvic fracture after a fall in 01/2018  6. Peripheral neuropathy in feet, G1-2 -Secondary to previous chemotherapy  -We previously discussed the role of Neurontin, she declined. -I previously suggested Vitamin B complex and continue practice of circulation of her feet. I advised her to be careful to not fall.  -Stable, will continue to monitor, she will let us know if this worsens.   7. Goal of care discussion  -We previously discussed the incurable nature of her cancer, and the overall poor prognosis, especially if she does not have good response to chemotherapy or progress on chemo -The patient understands the goal of care is palliative. -she is full code now    Plan -Lab reviewed, will proceed daratumumab infusion today  -f/u in 3 weeks -Continue Daratumumab every 3 weeks   All questions were answered. The patient knows to call the clinic with any problems, questions or concerns.  We can certainly see the patient much sooner if necessary.  I spent 20 minutes counseling the patient face to face. The total time spent in the appointment was 25 minutes.  Dierdre Searles Dweik am acting as scribe for Dr. [name].  I have reviewed the above documentation for accuracy and completeness, and I agree with the above.     [name]  03/17/2018

## 2018-03-17 NOTE — Progress Notes (Signed)
Dr Burr Medico to see pt in tx area before infusion today. Dr Burr Medico aware of pt's recent cough.   Per Dr Burr Medico ok to tx today.

## 2018-03-17 NOTE — Patient Instructions (Signed)
[facility] Discharge Instructions for Patients Receiving Chemotherapy  Today you received the following chemotherapy agents :  Daratumumab.  To help prevent nausea and vomiting after your treatment, we encourage you to take your nausea medication as prescribed.   If you develop nausea and vomiting that is not controlled by your nausea medication, call the clinic.   BELOW ARE SYMPTOMS THAT SHOULD BE REPORTED IMMEDIATELY:  *FEVER GREATER THAN 100.5 F  *CHILLS WITH OR WITHOUT FEVER  NAUSEA AND VOMITING THAT IS NOT CONTROLLED WITH YOUR NAUSEA MEDICATION  *UNUSUAL SHORTNESS OF BREATH  *UNUSUAL BRUISING OR BLEEDING  TENDERNESS IN MOUTH AND THROAT WITH OR WITHOUT PRESENCE OF ULCERS  *URINARY PROBLEMS  *BOWEL PROBLEMS  UNUSUAL RASH Items with * indicate a potential emergency and should be followed up as soon as possible.  Feel free to call the clinic should you have any questions or concerns. The clinic phone number is [phone].  Please show the CHEMO ALERT CARD at check-in to the Emergency Department and triage nurse.   

## 2018-03-27 ENCOUNTER — Telehealth: Payer: Self-pay | Admitting: Physician Assistant

## 2018-03-27 NOTE — Telephone Encounter (Signed)
Attempted to call the pt back on both contact numbers provided. Pt did not answer.  2nd attempt at trying to make contact with the pt to endorse recommendations per [facility], for the pt to start taking ASA 81 mg po daily.

## 2018-03-27 NOTE — Telephone Encounter (Signed)
Left the pt a message to call back to endorse recommendations per Melina Copa PA-C and Dr Meda Coffee, for the pt to resume ASA 81 mg po daily.

## 2018-03-27 NOTE — Telephone Encounter (Signed)
Please call pt. Aspirin had fallen off her list between our visit and the last time she saw Dr. Meda Coffee. I reviewed with dr. Meda Coffee who would like her to resume a baby aspirin daily, 81mg .  Dayna Dunn PA-C

## 2018-03-30 NOTE — Telephone Encounter (Signed)
Tried to reach pt re: Aspirin. Left a message for pt to call back.

## 2018-03-31 ENCOUNTER — Telehealth: Payer: Self-pay

## 2018-03-31 ENCOUNTER — Ambulatory Visit (HOSPITAL_COMMUNITY)
Admission: RE | Admit: 2018-03-31 | Discharge: 2018-03-31 | Disposition: A | Discharge status: Home / Self Care | Payer: Medicare Other | Source: Ambulatory Visit | Attending: Nurse Practitioner | Admitting: Nurse Practitioner

## 2018-03-31 ENCOUNTER — Other Ambulatory Visit: Payer: Self-pay

## 2018-03-31 DIAGNOSIS — C9002 Multiple myeloma in relapse: Secondary | ICD-10-CM | POA: Insufficient documentation

## 2018-03-31 DIAGNOSIS — M40204 Unspecified kyphosis, thoracic region: Secondary | ICD-10-CM | POA: Insufficient documentation

## 2018-03-31 DIAGNOSIS — M4854XA Collapsed vertebra, not elsewhere classified, thoracic region, initial encounter for fracture: Secondary | ICD-10-CM | POA: Diagnosis not present

## 2018-03-31 DIAGNOSIS — J069 Acute upper respiratory infection, unspecified: Secondary | ICD-10-CM

## 2018-03-31 DIAGNOSIS — R05 Cough: Secondary | ICD-10-CM | POA: Diagnosis not present

## 2018-03-31 DIAGNOSIS — R918 Other nonspecific abnormal finding of lung field: Secondary | ICD-10-CM | POA: Insufficient documentation

## 2018-03-31 NOTE — Telephone Encounter (Signed)
lpmtcb 9/17

## 2018-03-31 NOTE — Telephone Encounter (Signed)
Patient called stating was on a cruise for 10 days was given a script for Doxycycline by Dr. Burr Medico and started taking it on 9/10, has had fever which has decreased, today 99.6 and 100.2, productive cough with murky white sputum. Informed patient thinks she needs to go in for chest x-ray given her past history of recurrent pneumonia and still existing symptoms despite being on antibiotics for 6 days.  She verbalized an understanding and will go into [facility] to have done.  Cira Rue NP aware.

## 2018-04-01 ENCOUNTER — Telehealth: Payer: Self-pay | Admitting: Physician Assistant

## 2018-04-01 ENCOUNTER — Telehealth: Payer: Self-pay

## 2018-04-01 MED ORDER — ASPIRIN EC 81 MG PO TBEC: 81.0000 mg | DELAYED_RELEASE_TABLET | Freq: Every day | ORAL | 3 refills | Status: AC

## 2018-04-01 NOTE — Telephone Encounter (Signed)
Follow Up: ° ° ° °Returning your call from yesterday. °

## 2018-04-01 NOTE — Telephone Encounter (Signed)
Spoke with patient and informed her of Dr Francesca Oman and Lisbeth Renshaw Dunn's recommendation to resume Aspirin 81 mg daily.  She said that she has a tendency to bruise easily, but will comply with the recommendation.  She will keep [place] posted on the bruising.

## 2018-04-01 NOTE — Telephone Encounter (Signed)
Left voice message for patient that Lacie NP would like her to come in tomorrow 9/19 at 1:30 for lab and then see her at 2:00 pm.  Requested she call back if she is unable to keep this appointment.

## 2018-04-02 ENCOUNTER — Inpatient Hospital Stay (HOSPITAL_BASED_OUTPATIENT_CLINIC_OR_DEPARTMENT_OTHER): Payer: Medicare Other | Admitting: Nurse Practitioner

## 2018-04-02 ENCOUNTER — Telehealth: Payer: Self-pay

## 2018-04-02 ENCOUNTER — Encounter: Payer: Self-pay | Admitting: Nurse Practitioner

## 2018-04-02 ENCOUNTER — Inpatient Hospital Stay: Payer: Medicare Other

## 2018-04-02 VITALS — BP 121/70 | HR 77 | Temp 98.1°F | Resp 18 | Ht 67.0 in | Wt 115.7 lb

## 2018-04-02 DIAGNOSIS — I2510 Atherosclerotic heart disease of native coronary artery without angina pectoris: Secondary | ICD-10-CM | POA: Diagnosis not present

## 2018-04-02 DIAGNOSIS — G620 Drug-induced polyneuropathy: Secondary | ICD-10-CM | POA: Diagnosis not present

## 2018-04-02 DIAGNOSIS — Z9484 Stem cells transplant status: Secondary | ICD-10-CM | POA: Diagnosis not present

## 2018-04-02 DIAGNOSIS — I5042 Chronic combined systolic (congestive) and diastolic (congestive) heart failure: Secondary | ICD-10-CM | POA: Diagnosis not present

## 2018-04-02 DIAGNOSIS — M810 Age-related osteoporosis without current pathological fracture: Secondary | ICD-10-CM

## 2018-04-02 DIAGNOSIS — Z923 Personal history of irradiation: Secondary | ICD-10-CM | POA: Diagnosis not present

## 2018-04-02 DIAGNOSIS — C9002 Multiple myeloma in relapse: Secondary | ICD-10-CM | POA: Diagnosis not present

## 2018-04-02 DIAGNOSIS — Z79899 Other long term (current) drug therapy: Secondary | ICD-10-CM | POA: Diagnosis not present

## 2018-04-02 DIAGNOSIS — Z5112 Encounter for antineoplastic immunotherapy: Secondary | ICD-10-CM | POA: Diagnosis not present

## 2018-04-02 DIAGNOSIS — C9000 Multiple myeloma not having achieved remission: Secondary | ICD-10-CM

## 2018-04-02 DIAGNOSIS — I252 Old myocardial infarction: Secondary | ICD-10-CM | POA: Diagnosis not present

## 2018-04-02 DIAGNOSIS — I429 Cardiomyopathy, unspecified: Secondary | ICD-10-CM

## 2018-04-02 DIAGNOSIS — J189 Pneumonia, unspecified organism: Secondary | ICD-10-CM | POA: Diagnosis not present

## 2018-04-02 DIAGNOSIS — Z9221 Personal history of antineoplastic chemotherapy: Secondary | ICD-10-CM | POA: Diagnosis not present

## 2018-04-02 DIAGNOSIS — T451X5S Adverse effect of antineoplastic and immunosuppressive drugs, sequela: Secondary | ICD-10-CM

## 2018-04-02 DIAGNOSIS — D61818 Other pancytopenia: Secondary | ICD-10-CM

## 2018-04-02 DIAGNOSIS — R05 Cough: Secondary | ICD-10-CM | POA: Diagnosis not present

## 2018-04-02 DIAGNOSIS — C9001 Multiple myeloma in remission: Secondary | ICD-10-CM

## 2018-04-02 DIAGNOSIS — Z8701 Personal history of pneumonia (recurrent): Secondary | ICD-10-CM

## 2018-04-02 DIAGNOSIS — J069 Acute upper respiratory infection, unspecified: Secondary | ICD-10-CM

## 2018-04-02 DIAGNOSIS — R5383 Other fatigue: Secondary | ICD-10-CM | POA: Diagnosis not present

## 2018-04-02 LAB — CBC WITH DIFFERENTIAL (CANCER CENTER ONLY)
BASOS ABS: 0 10*3/uL (ref 0.0–0.1)
BASOS PCT: 1 %
EOS ABS: 0 10*3/uL (ref 0.0–0.5)
Eosinophils Relative: 0 %
HEMATOCRIT: 33.4 % — AB (ref 34.8–46.6)
HEMOGLOBIN: 11.2 g/dL — AB (ref 11.6–15.9)
Lymphocytes Relative: 7 %
Lymphs Abs: 0.2 10*3/uL — ABNORMAL LOW (ref 0.9–3.3)
MCH: 33.7 pg (ref 25.1–34.0)
MCHC: 33.5 g/dL (ref 31.5–36.0)
MCV: 100.6 fL (ref 79.5–101.0)
Monocytes Absolute: 0.2 10*3/uL (ref 0.1–0.9)
Monocytes Relative: 7 %
NEUTROS PCT: 85 %
Neutro Abs: 2.3 10*3/uL (ref 1.5–6.5)
Platelet Count: 124 10*3/uL — ABNORMAL LOW (ref 145–400)
RBC: 3.32 MIL/uL — ABNORMAL LOW (ref 3.70–5.45)
RDW: 16.8 % — ABNORMAL HIGH (ref 11.2–14.5)
WBC: 2.7 10*3/uL — AB (ref 3.9–10.3)

## 2018-04-02 LAB — COMPREHENSIVE METABOLIC PANEL
ALBUMIN: 3.7 g/dL (ref 3.5–5.0)
ALK PHOS: 87 U/L (ref 38–126)
ALT: 16 U/L (ref 0–44)
AST: 25 U/L (ref 15–41)
Anion gap: 8 (ref 5–15)
BILIRUBIN TOTAL: 0.5 mg/dL (ref 0.3–1.2)
BUN: 14 mg/dL (ref 8–23)
CALCIUM: 9.2 mg/dL (ref 8.9–10.3)
CO2: 28 mmol/L (ref 22–32)
Chloride: 105 mmol/L (ref 98–111)
Creatinine, Ser: 0.78 mg/dL (ref 0.44–1.00)
GFR calc Af Amer: >60 mL/min (ref >60)
GFR calc non Af Amer: >60 mL/min (ref >60)
GLUCOSE: 116 mg/dL — AB (ref 70–99)
Potassium: 3.8 mmol/L (ref 3.5–5.1)
Sodium: 141 mmol/L (ref 135–145)
TOTAL PROTEIN: 6.2 g/dL — AB (ref 6.5–8.1)

## 2018-04-02 MED ORDER — DOXYCYCLINE HYCLATE 100 MG PO TABS: 100.0000 mg | ORAL_TABLET | Freq: Two times a day (BID) | ORAL | 0 refills | Status: DC

## 2018-04-02 NOTE — Progress Notes (Signed)
[name]  Telephone:([phone]) [phone] [phone]  Clinic Follow up Note   Patient Care Team: [name], MD as PCP - General (Family Medicine) [name], MD as PCP - Cardiology (Cardiology) Stefanie Libel, MD (Family Medicine) [name], MD as Attending Physician (Obstetrics and Gynecology) [name], MD as Consulting Physician (Internal Medicine) [name], MD as Consulting Physician (Hematology) [name], MD as Consulting Physician (Ophthalmology) [name], MD as Consulting Physician (Cardiology) 04/02/2018  SUMMARY OF ONCOLOGIC HISTORY: Oncology History   Multiple myeloma [facility])   Staging form: Multiple Myeloma, AJCC 6th Edition   - Clinical: Stage IIIA - Signed by [name], MD on 09/04/2013      Multiple myeloma ([place])   05/26/2012 Initial Diagnosis    Presenting IgG was 4,040 mg/dL on 06/05/2012 (IgA 35; IgM 34); kappa free light chain 34.4 mg/dL, lambda 0.00, kappa:lambda ratio of 34.75; SPEP with M-spike of 2.60.    06/30/2012 Imaging    Numerous lytic lesions throughout the calvarium.  Lytic lesion within the left lateral scapula.  Findings most compatible with metastases or myeloma. Multiple mid and lower thoracic compression fractures.  Slight compression through the endplates at L3.    91/69/4503 Bone Marrow Biopsy    Bone marrow biopsy showed 49% plasma cell. Normal classical cytogenetics; however, myeloma FISH panel showed 13q- (intermediate risk)        07/14/2012 - 07/27/2012 Radiation Therapy    Palliative radiation 20 Gy over 10 fractions between to thoracic spine cord compression and symptomatic left scapula lytic lesion.    08/10/2012 - 11/2012 Chemotherapy    Started SQ Velcade once weekly, 3 weeks on, 1 weeks off; daily Revlimid d1-21, 7 days off; and Dexamethasone 59m PO weekly.     02/12/2013 Bone Marrow Transplant    [facility]     06/14/2013 Tumor Marker      IgG  1830,  Kappa:lamba ratio, 1.69 (baseline was 34.75 or   M-spike 0.28 (baseline of 2.6 or  89.3% of baseline).      06/25/2013 -  Chemotherapy    Started zometa 3.5 mg monthly     09/01/2013 - 01/2014 Chemotherapy    Maintenance therapy with revlimid 546mdaily for 14 days and then off for 7 (decreased from 21 days on and 7 days off based on neutropenia). Stopped due to disease progression 01/2017    09/13/2013 - 09/17/2014 Hospital Admission    Hospital admission for pneumonia    12/20/2013 Treatment Plan Change    Maintenance therapy decreased to 2.5 mg daily for 21 days and then off for 7 days based on low counts.     01/25/2014 Tumor Marker    IgG 1360 M spike 0.5    03/01/2014 - 03/05/2014 Hospital Admission    [facility], received thrombolytic therapy, cath showed 60% prox RCA, mid-distal RCA 50%, Echo normal, no stents.    04/11/2014 - 08/07/2014 Chemotherapy    Continue Revlimid at 2.5 mg 3 weeks on 1 week off    08/08/2014 - 08/13/2014 Hospital Admission    Hospital admission for pneumonia. Her Revlimid was held.    08/29/2014 - 09/14/2014 Chemotherapy    Maintenance Revlimid restarted    09/14/2014 - 09/17/2014 Hospital Admission    [place]    11/13/2014 - 12/25/2015 Chemotherapy    Maintenance  Revlimid restarted, changed to 2.77m daily, 2 weks on, 1 week off from 12/15/2014, stopped due to disease progression     01/24/2016 Progression    Patient is M protein has gradually increased to 1.0g, repeated a bone marrow biopsy showed plasma cell 10-30%    02/27/2016 - 03/07/2016 Chemotherapy    Pomalidomide 4 mg daily on day 1-21, dexamethasone 40 mg on day 1, 8 and 15, every 28 days, started on 02/27/2016, daratumumab weekly started on 03/07/2016, held after first dose, due to hospitalization and a severe pancytopenia, pomalidomide was stopped afterwards      03/10/2016 - 03/29/2016 Hospital Admission    Pt was admitted for sepsis from pneumonia, and severe pancytopenia. She required ICU stay for a few days due to hypotension, developed b/l pleural effusion required diuretics and b/l thoracentesis. She also required blood and plt transfusion, and prolonged neupogen injection for severe neutropenia. She was discharged home after 3 weeks hospital stay     04/26/2016 -  Chemotherapy    Daratumumab restarted on 04/26/2016, Velcade 1.318mm2 and dexa 2042meekly start on 11/3, velcade held since 06/28/2016 due to CHF     08/26/2016 Echocardiogram    - Left ventricle: LVEF is approximately 35% with diffuse diffuse   hypokinesis with severe hypokinesis/ akinesis of the   inferior/inferoseptal walls. In comparison to echo images from   December 2017, there does not appear to be significant change The   cavity size was normal. Wall thickness was normal. Doppler   parameters are consistent with abnormal left ventricular   relaxation (grade 1 diastolic dysfunction). - Aortic valve: AV is thickened. 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 Present in echo from December 2017 There was   trivial regurgitation. - Right ventricle: Systolic function was mildly reduced. - Right atrium: The atrium was mildly dilated. - Pericardium, extracardiac: A trivial pericardial effusion was   identified.    12/03/2016 Echocardiogram    -Left Ventricle: Systolic function was   mildly to moderately reduced. The estimated ejection fraction was   in the range of 40% to 45%. Diffuse hypokinesis. Doppler   parameters are consistent with abnormal left ventricular   relaxation (grade 1 diastolic dysfunction). Doppler parameters   are consistent with indeterminate ventricular filling pressure. - Left atrium: The atrium was severely dilated. - Tricuspid valve: There was trivial regurgitation. - Pulmonary arteries:  Systolic pressure was within the normal   range. PA peak pressure: 33 mm Hg (S). - Global longitudinal strain -10.5% (abnormal)    03/12/2017 Echocardiogram    ECHO 03/12/17 Study Conclusions - Left ventricle: The cavity size was normal. Wall thickness was   increased in a pattern of mild LVH. Systolic function was normal.   The estimated ejection fraction was in the range of 50% to 55%.   There is hypokinesis of the basalinferior myocardium. Doppler   parameters are consistent with abnormal left ventricular   relaxation (grade 1 diastolic dysfunction). - Aortic valve: There was trivial regurgitation. - Mitral valve: Calcified annulus. Mildly thickened leaflets .   There was trivial regurgitation. - Tricuspid valve: There was mild regurgitation. Impressions: - There has been mild improvement in EF since prior study.     12/12/2017 - 12/15/2017 Hospital Admission    Admission diagnosis: Pnuemonia and acute hypoxic respiratory failure Additional comments: treated for pneumoania and acute hypoxic respiratory failure before she was discharged on 12/15/17 with oral anitbiotics. She did not require home  oxygen.     12/30/2017 - 01/02/2018 Hospital Admission    Admission diagnosis: Pnuemonia  Additional comments: She was again hospitalized on 12/30/17 for pneumonia, ID work up was otherwise negative, she was discharged home with oral antibiotics.     04/01/2018 Imaging    04/01/2018 CXR IMPRESSION: Persisting airspace disease superimposed on chronic changes, suggesting pneumonia given the history. Followup PA and lateral chest X-ray is recommended in 3-4 weeks following therapy to ensure resolution and exclude underlying malignancy.  Redemonstration of mid and lower thoracic compression fractures with associated thoracic kyphosis.   CURRENT TREATMENT:   1. Pomalidomide 4 mg daily on day 1-21, dexamethasone 40on day 1, 8 and 15, every 28 days, started on 02/27/2016, daratumumab weekly  started on 03/07/2016, treatment on hold since 03/09/2016 due to hospitalization and severe cytopenia 2. Daratumumab restarted on 04/26/2016, Velcade 1.31m/m2 and dexa 26mweekly start on 11/3, velcade held since 06/28/2016 due to CHF. DaTonette Biharias changed from every 4 weeks to every 2 weeks on 03/28/2017 per Dr. GaKendell Baneecommendation. Held after 11/21/17 treatment due to recurrent pnuemonia, restarted 01/09/18 at every 3-4 weeks.  INTERVAL HISTORY: Ms. MoMonrroyomes in for work in visit with concerns for pneumonia. She was on a cruise from 9/8-9/15. On 9/10 she developed symptoms beginning with cough and fever, she started doxycycline that Dr. FeBurr Medicoad given her before the trip. After 1-2 days on doxy her fever went away. Tmax over last 2 days was 99.6. She completed doxycycline today, felt better on medication. She takes OTC cough suppressant. She remains fatigued, but able to ride her bike to grocery store yesterday. Cough remains productive with malodorous yellow-green sputum. No blood-tinged sputum. She has occasional chest pain on inspiration, no dyspnea. Bowels are normal, eating and drinking well. She underwent chest xray 2 days ago for her symptoms.    MEDICAL HISTORY:  Past Medical History:  Diagnosis Date  . CAD (coronary artery disease)    [place]  p[place]CA 60%, mid to dist RCA 50% >>> med rx  . Cardiomyopathy ([place]  . Chronic combined systolic and diastolic CHF (congestive heart failure) ([place]  . [place]   MRI- diffuse myeloma involvement of T-L spine  . History of radiation therapy 07/13/12-07/27/12   spinal cord compression T3-T10,left scapula  . Hx of echocardiogram    [facility]  Normal LVEF without significant regional wall motion abnormalities. Mild   . Multiple myeloma ([place]/2013  . MVP (mitral valve prolapse)    a. trivial by  echo 06/2017.  . OSTEOPOROSIS 06/11/2010   Multiple compression fractures; and spontaneous fracture of sternum Qualifier: Diagnosis of  By: SaZebedee IbaP, SuManuela Schwartz   . Thoracic kyphosis 07/13/12   per MRI scan  . Unspecified deficiency anemia     SURGICAL HISTORY: Past Surgical History:  Procedure Laterality Date  . APPENDECTOMY    . CESAREAN SECTION     x2   . COLONOSCOPY  2007   neg with Dr. MaWatt Climes. ELBOW SURGERY    . HIP SURGERY  2009   left  . TUBAL LIGATION      I have reviewed the social history and family history with the patient and they are unchanged from previous note.  ALLERGIES:  is allergic to zithromax [azithromycin]; zosyn [piperacillin sod-tazobactam so]; and quinolones.  MEDICATIONS:  Current Outpatient Medications  Medication Sig Dispense Refill  . acetaminophen (TYLENOL) 500 MG tablet Take 500 mg by mouth every 6 (six) hours as needed for moderate pain or fever.    Marland Kitchen acyclovir (ZOVIRAX) 400 MG tablet Take 1 tablet (400 mg total) by mouth 2 (two) times daily. 60 tablet 11  . aspirin EC 81 MG tablet Take 1 tablet (81 mg total) by mouth daily. 90 tablet 3  . atorvastatin (LIPITOR) 20 MG tablet Take 1 tablet (20 mg total) by mouth daily. 90 tablet 3  . B Complex-C (SUPER B COMPLEX PO) Take 1 tablet by mouth daily.    . calcium carbonate (OS-CAL) 600 MG TABS tablet Take 600 mg by mouth daily.    Marland Kitchen dexamethasone (DECADRON) 4 MG tablet Take 5 tab the day before chemo, every 2 weeks 30 tablet 1  . metoprolol succinate (TOPROL-XL) 25 MG 24 hr tablet Take 1 tablet (25 mg total) by mouth daily. 90 tablet 2  . Multiple Vitamin (MULTIVITAMIN WITH MINERALS) TABS tablet Take 1 tablet by mouth daily. 120 tablet 0  . polyethylene glycol (MIRALAX) packet Take 17 g by mouth daily as needed. 14 each 0  . doxycycline (VIBRA-TABS) 100 MG tablet Take 1 tablet (100 mg total) by mouth 2 (two) times daily. 10 tablet 0   No current facility-administered medications for this visit.      PHYSICAL EXAMINATION: ECOG PERFORMANCE STATUS: 1 - Symptomatic but completely ambulatory  Vitals:   04/02/18 1408  BP: 121/70  Pulse: 77  Resp: 18  Temp: 98.1 F (36.7 C)  SpO2: 91%   Filed Weights   04/02/18 1408  Weight: 115 lb 11.2 oz (52.5 kg)    GENERAL:alert, no distress and comfortable SKIN: no rashes or significant lesions EYES:  sclera clear OROPHARYNX:no thrush or ulcers LYMPH:  no palpable cervical or supraclavicular lymphadenopathy  LUNGS: clear to auscultation, normal breathing effort; no wheezes or abnormal breath sounds  HEART: regular rate & rhythm  ABDOMEN:abdomen soft, non-tender and normal bowel sounds NEURO: alert & oriented x 3 with fluent speech  LABORATORY DATA:  I have reviewed the data as listed CBC Latest Ref Rng & Units 04/02/2018 03/17/2018 02/26/2018  WBC 3.9 - 10.3 K/uL 2.7(L) 5.6 4.4  Hemoglobin 11.6 - 15.9 g/dL 11.2(L) 10.5(L) 10.7(L)  Hematocrit 34.8 - 46.6 % 33.4(L) 32.5(L) 32.7(L)  Platelets 145 - 400 K/uL 124(L) 176 205     CMP Latest Ref Rng & Units 04/02/2018 03/17/2018 02/26/2018  Glucose 70 - 99 mg/dL 116(H) 100(H) 107(H)  BUN 8 - 23 mg/dL _0 Creatinine 0.44 - 1.00 mg/dL 0.78 0.75 0.77  Sodium 135 - 145 mmol/L 141 139 140  Potassium 3.5 - 5.1 mmol/L 3.8 4.2 3.8  Chloride 98 - 111 mmol/L 105 105 105  CO2 22 - 32 mmol/L _1 Calcium 8.9 - 10.3 mg/dL 9.2 9.3 9.1  Total Protein 6.5 - 8.1 g/dL 6.2(L) 6.3(L) 6.4(L)  Total Bilirubin 0.3 - 1.2 mg/dL 0.5 0.6 0.6  Alkaline Phos 38 - 126 U/L 87 96 145(H)  AST 15 - 41 U/L _2 ALT 0 - 44 U/L _3 RADIOGRAPHIC STUDIES: I have personally reviewed the radiological images as listed and agreed with the findings in the report. Dg Chest 2 View  Result Date: 04/01/2018 CLINICAL DATA:  77 year old female with a history of fever and cough EXAM: CHEST - 2 VIEW COMPARISON:  Chest x-ray 01/27/2018, CT 12/30/2017, chest x-ray  12/30/2017, 12/12/2017 FINDINGS:  Cardiomediastinal silhouette unchanged in size and contour. No evidence of central vascular congestion. Persisting chronic changes of architectural distortion and reticular opacities of the upper and lower lungs over time, with hazy airspace opacity within the mid/posterior lung on the lateral view, worse than on remote comparison chest x-ray. No pleural effusion. No pneumothorax. Similar pleuroparenchymal thickening at the apices. Similar appearance of bilateral chronic rib deformity from prior fractures. Redemonstration of significant thoracic kyphosis, with mid and lower thoracic compression fractures, unchanged. Unchanged appearance of the chronic sternal deformity. Osteopenia. IMPRESSION: Persisting airspace disease superimposed on chronic changes, suggesting pneumonia given the history. Followup PA and lateral chest X-ray is recommended in 3-4 weeks following therapy to ensure resolution and exclude underlying malignancy. Redemonstration of mid and lower thoracic compression fractures with associated thoracic kyphosis. Electronically Signed   By: Corrie Mckusick D.O.   On: 04/01/2018 09:45     ASSESSMENT & PLAN: 77 y.o. female with a history of Multiple Myeloma s/p autotransplant in August 2014 followed by maintenance low dose Revlimid therapy, and relapsed in 01/2016  1. Recurrent pneumonia  2. IgG kappa Multiple myeloma s/p Auto SCT, intermediate risk, relapsed in 01/2016 3. Pancytopenia, secondary to daratumumab  4. CHF, CAD, inferior STEMI in 02/2014 5. Osteoporosis with multiple fractures  6. Peripheral neuropathy in feet, G1-2 7. Goal of care discussion   Ms. Follansbee appears stable. She developed symptoms concerning for pneumonia while on her cruise. She began oral doxycyline and completed last dose today. Chest xray on 9/17 shows persisting airspace disease superimposed on chronic changes, suggesting pneumonia. Labs show mild leukopenia and thrombocytopenia. She improved on doxycycline. I  recommend to continue doxy x5 more days. She will return next week for regularly scheduled appointment and dara. If she worsens or fails to improve, she will call [place] immediately.   PLAN: -Labs and chest xray reviewed -Continue doxycycline 100 mg BID x5 more days, refilled today  -Return next week for regularly scheduled appt and dara   All questions were answered. The patient knows to call the clinic with any problems, questions or concerns. No barriers to learning was detected.     Alla Feeling, NP 04/02/18

## 2018-04-02 NOTE — Progress Notes (Signed)
[facility] PROGRESS NOTE  PCP: [name], MD [place] [address]  Date of Service:  04/07/2018   DIAGNOSIS: Multiple myeloma with relapse  CHIEF COMPLAINTS: F/u of MM   Oncology History   Multiple myeloma ([place])   Staging form: Multiple Myeloma, AJCC 6th Edition   - Clinical: Stage IIIA - Signed by [name], MD on 09/04/2013      Multiple myeloma ([place])   05/26/2012 Initial Diagnosis    Presenting IgG was 4,040 mg/dL on 06/05/2012 (IgA 35; IgM 34); kappa free light chain 34.4 mg/dL, lambda 0.00, kappa:lambda ratio of 34.75; SPEP with M-spike of 2.60.    06/30/2012 Imaging    Numerous lytic lesions throughout the calvarium.  Lytic lesion within the left lateral scapula.  Findings most compatible with metastases or myeloma. Multiple mid and lower thoracic compression fractures.  Slight compression through the endplates at L3.    57/32/2025 Bone Marrow Biopsy    Bone marrow biopsy showed 49% plasma cell. Normal classical cytogenetics; however, myeloma FISH panel showed 13q- (intermediate risk)        07/14/2012 - 07/27/2012 Radiation Therapy    Palliative radiation 20 Gy over 10 fractions between to thoracic spine cord compression and symptomatic left scapula lytic lesion.    08/10/2012 - 11/2012 Chemotherapy    Started SQ Velcade once weekly, 3 weeks on, 1 weeks off; daily Revlimid d1-21, 7 days off; and Dexamethasone 83m PO weekly.     02/12/2013 Bone Marrow Transplant    [facility]     06/14/2013 Tumor Marker    IgG  1830,  Kappa:lamba ratio, 1.69 (baseline was 34.75 or   M-spike 0.28 (baseline of 2.6 or  89.3% of baseline).      06/25/2013 -  Chemotherapy    Started zometa 3.5 mg monthly     09/01/2013 - 01/2014 Chemotherapy    Maintenance therapy with revlimid 583mdaily for 14 days and then off for 7 (decreased from 21 days on and 7 days off based on neutropenia). Stopped due to disease  progression 01/2017    09/13/2013 - 09/17/2014 Hospital Admission    Hospital admission for pneumonia    12/20/2013 Treatment Plan Change    Maintenance therapy decreased to 2.5 mg daily for 21 days and then off for 7 days based on low counts.     01/25/2014 Tumor Marker    IgG 1360 M spike 0.5    03/01/2014 - 03/05/2014 Hospital Admission    [facility], received thrombolytic therapy, cath showed 60% prox RCA, mid-distal RCA 50%, Echo normal, no stents.    04/11/2014 - 08/07/2014 Chemotherapy    Continue Revlimid at 2.5 mg 3 weeks on 1 week off    08/08/2014 - 08/13/2014 Hospital Admission    Hospital admission for pneumonia. Her Revlimid was held.    08/29/2014 - 09/14/2014 Chemotherapy    Maintenance Revlimid restarted    09/14/2014 - 09/17/2014 Hospital Admission    [place]    11/13/2014 - 12/25/2015 Chemotherapy    Maintenance Revlimid restarted, changed to 2.40m36maily, 2 weks on, 1 week off from 12/15/2014, stopped due to disease progression     01/24/2016 Progression    Patient is M protein has gradually increased to 1.0g, repeated a bone marrow biopsy showed plasma cell 10-30%  02/27/2016 - 03/07/2016 Chemotherapy    Pomalidomide 4 mg daily on day 1-21, dexamethasone 40 mg on day 1, 8 and 15, every 28 days, started on 02/27/2016, daratumumab weekly started on 03/07/2016, held after first dose, due to hospitalization and a severe pancytopenia, pomalidomide was stopped afterwards     03/10/2016 - 03/29/2016 Hospital Admission    Pt was admitted for sepsis from pneumonia, and severe pancytopenia. She required ICU stay for a few days due to hypotension, developed b/l pleural effusion required diuretics and b/l thoracentesis. She also required blood and plt transfusion, and prolonged neupogen injection for severe neutropenia. She was discharged home after 3 weeks  hospital stay     04/26/2016 -  Chemotherapy    Daratumumab restarted on 04/26/2016, Velcade 1.33m/m2 and dexa 275mweekly start on 11/3, velcade held since 06/28/2016 due to CHF     08/26/2016 Echocardiogram    - Left ventricle: LVEF is approximately 35% with diffuse diffuse   hypokinesis with severe hypokinesis/ akinesis of the   inferior/inferoseptal walls. In comparison to echo images from   December 2017, there does not appear to be significant change The   cavity size was normal. Wall thickness was normal. Doppler   parameters are consistent with abnormal left ventricular   relaxation (grade 1 diastolic dysfunction). - Aortic valve: AV is thickened. 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 Present in echo from December 2017 There was   trivial regurgitation. - Right ventricle: Systolic function was mildly reduced. - Right atrium: The atrium was mildly dilated. - Pericardium, extracardiac: A trivial pericardial effusion was   identified.    12/03/2016 Echocardiogram    -Left Ventricle: Systolic function was   mildly to moderately reduced. The estimated ejection fraction was   in the range of 40% to 45%. Diffuse hypokinesis. Doppler   parameters are consistent with abnormal left ventricular   relaxation (grade 1 diastolic dysfunction). Doppler parameters   are consistent with indeterminate ventricular filling pressure. - Left atrium: The atrium was severely dilated. - Tricuspid valve: There was trivial regurgitation. - Pulmonary arteries: Systolic pressure was within the normal   range. PA peak pressure: 33 mm Hg (S). - Global longitudinal strain -10.5% (abnormal)    03/12/2017 Echocardiogram    ECHO 03/12/17 Study Conclusions - Left ventricle: The cavity size was normal. Wall thickness was   increased in a pattern of mild LVH. Systolic function was normal.   The estimated ejection fraction was in the range of 50%  to 55%.   There is hypokinesis of the basalinferior myocardium. Doppler   parameters are consistent with abnormal left ventricular   relaxation (grade 1 diastolic dysfunction). - Aortic valve: There was trivial regurgitation. - Mitral valve: Calcified annulus. Mildly thickened leaflets .   There was trivial regurgitation. - Tricuspid valve: There was mild regurgitation. Impressions: - There has been mild improvement in EF since prior study.     12/12/2017 - 12/15/2017 Hospital Admission    Admission diagnosis: Pnuemonia and acute hypoxic respiratory failure Additional comments: treated for pneumoania and acute hypoxic respiratory failure before she was discharged on 12/15/17 with oral anitbiotics. She did not require home oxygen.     12/30/2017 - 01/02/2018 Hospital Admission    Admission diagnosis: Pnuemonia  Additional comments: She was again hospitalized on 12/30/17 for pneumonia, ID work up was otherwise negative, she was discharged home with oral antibiotics.     04/01/2018 Imaging  04/01/2018 CXR IMPRESSION: Persisting airspace disease superimposed on chronic changes, suggesting pneumonia given the history. Followup PA and lateral chest X-ray is recommended in 3-4 weeks following therapy to ensure resolution and exclude underlying malignancy.  Redemonstration of mid and lower thoracic compression fractures with associated thoracic kyphosis.      CURRENT TREATMENT:   1. Pomalidomide 4 mg daily on day 1-21, dexamethasone 40on day 1, 8 and 15, every 28 days, started on 02/27/2016, daratumumab weekly started on 03/07/2016, treatment on hold since 03/09/2016 due to hospitalization and severe cytopenia 2. Daratumumab restarted on 04/26/2016, Velcade 1.14m/m2 and dexa 283mweekly start on 11/3, velcade held since 06/28/2016 due to CHF. DaTonette Biharias changed from every 4 weeks to every 2 weeks on 03/28/2017 per Dr. GaKendell Baneecommendation. Held after 11/21/17 treatment due to recurrent  pnuemonia, restarted 01/09/18 at every 3-4 weeks.  INTERVAL HISTORY:  ClCINDIE RAJAGOPALAN89.o. female here for a follow up. Dr. NeMeda Coffeeestarted the patient on baby Aspirin on 03/27/2018. Patient was on a cruise trip and was given Doxycycline prophylaxis by me and educated about warning symptoms. On 03/31/2018, she called complaining of persistent productive cough with while murky sputum. NP Lacie ordered a CXR on 04/01/2018 due to persistent symptoms and history of recurrent pneumonia, and results were consistent with pneumonia.  She is here alone at the infusion room. She had fever last week, but is doing well now. She denies night sweats and she is trying to maintain hydration. She is doing well overall, but is still coughing.    MEDICAL HISTORY: Past Medical History:  Diagnosis Date  . CAD (coronary artery disease)    a. [place] LHC (8/[place]- [place]  pRCA 60%, mid to dist RCA 50% >>> med rx  . Cardiomyopathy ([facility]  . Chronic combined systolic and diastolic CHF (congestive heart failure) ([facility]  . Cord compression ([place]   MRI- diffuse myeloma involvement of T-L spine  . History of radiation therapy 07/13/12-07/27/12   spinal cord compression T3-T10,left scapula  . Hx of echocardiogram    [facility]  Normal LVEF without significant regional wall motion abnormalities. Mild   . Multiple myeloma ([facility]2013  . MVP (mitral valve prolapse)    a. trivial by echo 06/2017.  . OSTEOPOROSIS 06/11/2010   Multiple compression fractures; and spontaneous fracture of sternum Qualifier: Diagnosis of  By: SaZebedee IbaP, SuManuela Schwartz   . Thoracic kyphosis 07/13/12   per MRI scan  . Unspecified deficiency anemia    ALLERGIES:  is allergic to zithromax [azithromycin]; zosyn [piperacillin sod-tazobactam so]; and quinolones.  SURGICAL HISTORY:  Past Surgical History:  Procedure Laterality Date  .  APPENDECTOMY    . CESAREAN SECTION     x2   . COLONOSCOPY  2007   neg with Dr. MaWatt Climes. ELBOW SURGERY    . HIP SURGERY  2009   left  . TUBAL LIGATION     REVIEW OF SYSTEMS:   Constitutional: Denies abnormal weight loss  Eyes: Denies blurriness of vision  Ears, nose, mouth, throat, and face: Denies mucositis   Respiratory: Denies dyspnea or wheezes (+) cough Cardiovascular: Denies palpitation, chest discomfort  Gastrointestinal:  Denies heartburn   Skin: Denies abnormal skin rashes  Lymphatics: Denies new lymphadenopathy or easy bruising Neurological:Denies new weaknesses. (+) Positive for paraesthesias and numbness to the feet (baseline). Stable.  Behavioral/Psych: Mood is stable, no  new changes  All other systems were reviewed with the patient and are negative.   PHYSICAL EXAMINATION:   ECOG PERFORMANCE STATUS: 1 Vitals: BP 122/81 Pulse 85 RR 16 GENERAL:alert, no distress and comfortable; easily mobile to exam table; kyphosis, moderate.  SKIN: skin color, texture, turgor are normal, no rashes or significant lesions except a small healing, dry wound in the right lower leg above his ankle, with surrounding skin erythema and swelling EYES: normal, Conjunctiva are pink, 1 tear/injection blood vessel left eye in sclera. OROPHARYNX:no exudate, no erythema and lips, buccal mucosa, and tongue normal, no ONJ NECK: supple, thyroid normal size, non-tender, without nodularity; nodules cervical bilaterally.  LYMPH:  no palpable lymphadenopathy in the cervical, axillary or supraclavicular LUNGS: clear to precussion, no wheezing. (+) b/l crackles and wheezing on bases of lungs  HEART: regular rate & rhythm and no murmurs  ABDOMEN:abdomen soft, and normal bowel sounds  Musculoskeletal:no cyanosis of digits and no clubbing, NEURO: alert & oriented x 3 with fluent speech, no focal motor/sensory deficits   Labs:  CBC Latest Ref Rng & Units 04/07/2018 04/02/2018 03/17/2018  WBC 3.9 - 10.3 K/uL  3.3(L) 2.7(L) 5.6  Hemoglobin 11.6 - 15.9 g/dL 10.8(L) 11.2(L) 10.5(L)  Hematocrit 34.8 - 46.6 % 32.5(L) 33.4(L) 32.5(L)  Platelets 145 - 400 K/uL 171 124(L) 176    CMP Latest Ref Rng & Units 04/07/2018 04/02/2018 03/17/2018  Glucose 70 - 99 mg/dL 126(H) 116(H) 100(H)  BUN 8 - 23 mg/dL _0 Creatinine 0.44 - 1.00 mg/dL 0.76 0.78 0.75  Sodium 135 - 145 mmol/L 138 141 139  Potassium 3.5 - 5.1 mmol/L 3.8 3.8 4.2  Chloride 98 - 111 mmol/L 106 105 105  CO2 22 - 32 mmol/L _1 Calcium 8.9 - 10.3 mg/dL 8.7(L) 9.2 9.3  Total Protein 6.5 - 8.1 g/dL 6.0(L) 6.2(L) 6.3(L)  Total Bilirubin 0.3 - 1.2 mg/dL 0.5 0.5 0.6  Alkaline Phos 38 - 126 U/L 79 87 96  AST 15 - 41 U/L _2 ALT 0 - 44 U/L _3 SPEP M-PROTEIN  10/01/2013: 0.18 (nadir)  10/13/2014: 0.6 11/21/2014: 0.5 01/30/2015: 0.6 03/13/2015: 0.6 04/20/2015: 0.5 06/01/2015: 0.5 07/20/2015: 0.7 09/14/2015: 0.7 10/26/2015: 0.8 12/07/2015: 1.0 02/07/2016: 1.1  04/03/2016: 0.2 05/03/2016: 0.2  05/31/2016: 0.1 06/28/2016: 0.1 07/25/2016: 0.1 08/21/2016: 0.1 09/27/2016: 0.1 10/23/16: 0.1 12/27/2016: 0.1 (Dara Specific IFE was still positive) 01/24/17: 0.1 02/28/17: 0.1 03/28/17: 0.1 04/25/17: 0.1 05/23/17: 0.1 06/27/17: 0.1 08/01/17: 0.1 2:15/19: 0.1 09/26/17: 0.2 10/24/17: 0.1 11/21/17: 0.2 01/09/18: 0.2 01/29/18: 0.2 02/26/2018: 0.2 04/02/2018: 0.1  IgG [phone]) 10/13/2014: 1710 11/21/2014: 1460 01/30/2015: 1380 03/13/15: 1570 04/20/2015: 1550 06/01/2015: 1590 07/22/2015: 1770 09/14/2015: 1787 10/26/2015: 1898 12/07/2015: 2045 02/07/2016: 1883 04/03/2016: 787 05/03/2016: 685  05/31/2016: 544 06/28/2016: 463 07/25/2016: 455 08/21/2016: 426 09/27/2016: 438 10/23/16: 374 12/06/16: 370 12/27/16: 343, 346 01/24/17: 332  02/28/17: 362 03/28/17:  436 04/25/17: 431 05/23/17: 471 06/27/17: 429 08/01/17: 392 08/29/17: 409 09/26/17: 421 10/24/17: 438 11/21/17: 465 01/09/18: 756 01/29/18: 638   Serum kappa, lambda light chains, and ratio   11/21/2014: 3.95, 2.08, 1.90  01/30/2015: 3.92, 2.64, 1.48 03/13/2015: 3.55, 2.17, 1.64 04/20/15: 4.52, 1.78, 2.54 06/01/2015: 4.82, 1.75, 2.75 07/20/2015: 8.71, 2.43, 3.58 09/14/2015: 9.17, 2.02, 4.55 10/26/2015: 11.4, 1.75, 6.42 12/07/2015: 1.54, 1.83, 8.44 02/07/2016: 1.93, 1.42, 13.6 04/03/2016: 0.96, 0.95, 1.01  05/03/2016: 0.71, 0.34, 2.09  05/31/2016: 0.48, 0.26, 1.85 06/28/2016: 0.52, 0.60, 0.87 07/25/2016: 0.46, 0.27, 1.70 08/21/2016: 0.43, 0.27, 1.59  09/27/2016: 0.52, 0.24, 2.17 10/23/16: .0.49, 0.35, 1.40 12/06/16: 0.68, 0.29, 2.34 12/27/16: 0.54, 0.29, 1.86 02/28/17: 0.56, 0.30, 1.87 04/11/17: 0.61, 0.29, 2.10 05/09/17: 0.57, 0.33, 1.73 06/13/17: 0.65, 0.23, 2.83 07/18/17: 0.75 0.26, 2.83 08/15/17: 0.78, 0.27, 2.89 08/29/17: 0.79, 0.3, 2.63 09/26/17: 0.88, 0.26, 3.38  10/24/17: 0.95, 0.36, 2.64 11/21/17: 0.99, 0.24, 4.13 01/09/18: 1.79, 1.01, 1.77 01/29/18: 12.9, 6.4, 2.02 02/26/2018: 12.7, 2.9, 4.38 04/02/2018: 12.4, 3.1, 4.00   24 hr urine PEP (total protein, M-spike) 10/13/2014: (-) 02/12/2016: 299m, 65m9/22/2017: 114 mg, (-) 08/08/16: 99, (-) 12/27/16: 5965m(-) 03/28/17: 98m50m-) 01/22/18: not observed   PATHOLOGY REPORT  Diagnosis 01/24/2016 Bone Marrow, Aspirate,Biopsy, and Clot, RT iliac and core - VARIABLY CELLULAR BONE MARROW WITH PLASMA CELL NEOPLASM. - SEE COMMENT. PERIPHERAL BLOOD: - MACROCYTIC ANEMIA.  Diagnosis Note The bone marrow is variably cellular with trilineage hematopoiesis and non specific myeloid changes. In this background, the plasma cell component is variable ranging from less than 10 % in many areas to 30% in some aspirate particles with relatively low cellularity. Foci of interstitial infiltrates and small clusters are also seen in the clot section. Immunohistochemical stains show kappa light chain restriction in atypical plasma cell clusters and infiltrates consistent with plasma cell neoplasm. Correlation with cytogenetic and FISH studies is  recommended. (BNS:gt, 01/25/16)  Diagnosis 03/28/16 PLEURAL FLUID, LEFT (SPECIMEN 1 OF 1 COLLECTED 03/28/16): REACTIVE MESOTHELIAL CELLS  RADIOGRAPHIC STUDIES:  04/01/2018 CXR IMPRESSION: Persisting airspace disease superimposed on chronic changes, suggesting pneumonia given the history. Followup PA and lateral chest X-ray is recommended in 3-4 weeks following therapy to ensure resolution and exclude underlying malignancy.  Redemonstration of mid and lower thoracic compression fractures with associated thoracic kyphosis.  06/26/2017 ECHO LV EF: 55% -   60%  ECHO 03/12/17 Study Conclusions - Left ventricle: The cavity size was normal. Wall thickness was   increased in a pattern of mild LVH. Systolic function was normal.   The estimated ejection fraction was in the range of 50% to 55%.   There is hypokinesis of the basalinferior myocardium. Doppler   parameters are consistent with abnormal left ventricular   relaxation (grade 1 diastolic dysfunction). - Aortic valve: There was trivial regurgitation. - Mitral valve: Calcified annulus. Mildly thickened leaflets .   There was trivial regurgitation. - Tricuspid valve: There was mild regurgitation. Impressions: - There has been mild improvement in EF since prior study.    Echo 12/03/16 -Left Ventricle: Systolic function was   mildly to moderately reduced. The estimated ejection fraction was   in the range of 40% to 45%. Diffuse hypokinesis. Doppler  parameters are consistent with abnormal left ventricular relaxation (grade 1 diastolic dysfunction). Doppler parameters are consistent with indeterminate ventricular filling pressure. - Left atrium: The atrium was severely dilated. - Tricuspid valve: There was trivial regurgitation. - Pulmonary arteries: Systolic pressure was within the normal   range. PA peak pressure: 33 mm Hg (S). - Global longitudinal strain -10.5% (abnormal)  Echo 08/26/2016 - Left ventricle: LVEF is  approximately 35% with diffuse hypokinesis with severe hypokinesis/ akinesis of the inferior/inferoseptal walls. In comparison to echo images from December 2017, there does not appear to be significant change The cavity size was normal. Wall thickness was normal. Doppler parameters are consistent with abnormal left ventricular relaxation (grade 1 diastolic dysfunction). - Aortic valve: AV is thickened. 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 Present in echo from December 2017 There was trivial regurgitation. - Right  ventricle: Systolic function was mildly reduced. - Right atrium: The atrium was mildly dilated. - Pericardium, extracardiac: A trivial pericardial effusion was identified.  ASSESSMENT:   Shannon Rush 77 y.o. female with a history of Multiple Myeloma s/p autotransplant in August 2014 followed by maintenance low dose Revlimid therapy, and relapsed in 01/2016   1. IgG kappa Multiple myeloma s/p Auto SCT, intermediate risk, relapsed in 01/2016 --MM markers demonstrate a very good initial response (M protein baseline 2.6, down to 0.18 after induction chemo and transplant, stable around 0.5 since July 2015, but has gradually increased lately),  she unfortunately relapsed in July 2017 -I previously discussed her repeated bone marrow biopsy from 02/01/2016, which showed 10-30% plasma cells in her marrow -I agreed with Dr. Kendell Bane recommendation to stop Revlimid and change to Daratumumab, Pomalidomide and dexamethasone due to her disease relapse. I don't think she has resistance to Revlimid, she has been on very small dose of Revlimid due to her prior history of multiple infections. -She started first cycle DaraPD in 02/2016, but 2 weeks later she developed severe pancytopenia, pneumonia with sepsis required 3 weeks hospital stay. She did have a very nice response to the treatment, her M-protein has significantly reduced from 1.2 to  0.2, light chain levels normalized even with half cycle treatment  -I previously recommended her to change regiment to DaraVD, and started her on dara every 2 weeks for 2 doses, she tolerated this very well -she has had good response to treatment, M-protein significantly dropped -Her previous echo in December 2017 showed a significant drop on EF, she is not much symptomatically for CHF, due to the potential contribution of Velcade to her CHF, I'll hold her Velcade for now. Her repeated echo in May 2018 showed a significant improvement, EF 40-45%. -her dara-specific IFE in 12/2016 was still positive, she has low level M-protein, she has achieved a good partial response. -She hasn't had Velcade since December 2017. Will continue holding it for now due to her CHF -We previously discussed her ECHO from 02/2017 and 06/2017 that showed her heart function has normalized to EF of 50% to 55% and 55-60% respectively.  -She has seen [facility] [name] [facility] who suggests she use a combination treatment of Dara + low dose Pomalidomide with and increase treatment to every 2 weeks.  -Due to her previous severe cytopenia and infection, and a prolonged excellent response to single agent Dara, I did not recommend her to change to DaraPD again. We decided to change Dara/dexa treatment to every 2 weeks on 03/28/17, she has been tolerating well overall. Her M-protein level has been stable (0.1-0.2) since then  -If she has disease progression, I would consider adding Velcade back -Treatment was previously held 11/21/17-01/09/18 due to hospitalization from recurrent pneumonia. I previously discussed we will change her dura to every 3-4 weeks due to her recurrent infection.  -she has developed pneumonia again during her cruise trip in early Sep 2019  -Labs reviewed, CBC showed Hg 10.8 WBC 3.3,  We will hold daratumumab infusion today due to unresolved pneumonia  -I advised her to monitor her cough and check for fever. -F/u next week    2. Recurrent pneumonia  -Presented after her cruise with productive cough with white-yellowish phlegm, congestion, upper abdominal soreness, diarrhea/watery stool, , nausea, sore throat, no appetite, weight loss, fever and headaches. -Previous exam showed bilateral rales and rhonchi. She desatted to 85% on room air when she walks, highly suspicious for b/l pneumonia. -She previously  had multiple episodes of pneumonia, which required ICU stay and prolonged hospital stay. She is immunocompromised due to her treatment -After ED recommendation she was admitted on 12/12/17 and was treated for pneumonia and acute hypoxic respiratory failure before she was discharged on 12/15/17 with oral antibiotics.  -She was again hospitalized on 12/30/17 as her pneumonia did not resolve. I previously suggested ID consult and workup to rule out opportunistic infections as she is immunocompromised. She was discharged on 01/02/18. Her ID workup was negative. -She again developed ammonia during her cruise trip in early September, she did complete a prolonged course of doxycycline, was seen by nurse practitioner Bella Kennedy last week.  She has improved, afebrile, but still has productive cough, mild wheezing, and bilateral bronchi, I will give her 5 days of cefprozil 547m bid today, she did have allergy reaction to penicillin, but previously tolerated cephalosporins, such as Keflex.  3. Pancytopenia  -secondary to Dara, slightly worse after we increased Dara to every 2 weeks  -Continue monitoring closely. -WBC at 3.3, Hg at 10.8 Lymph Ab 0.4 today   4. CHF, CAD, inferior STEMI in 02/2014 -continue metoprolol and lipitor, Dr. NMeda Coffeepreviously started her on lisinopril also -Dr. NMeda Coffeeplans to repeat her echo every 3 months while on treatment.  -I previously encouraged her to watch for leg swelling and SOB and chest pain as these are symptoms of her heart condition. -Her latest ECHO from 06/2017 shows improved heart function with  EF of 55-60%.   5. Osteoporosis with multiple fractures  -previous bone density scan showed worsening osteoporosis, continue Zometa, changed to every 3 months after 03/2015.  -She declined repeating DEXA  -She was on Zometa for 4 years, the risk probably outweighs the benefit so we stopped it, last injection was 10/23/16 -she recently had pelvic fracture after a fall in 01/2018  6. Peripheral neuropathy in feet, G1-2 -Secondary to previous chemotherapy  -We previously discussed the role of Neurontin, she declined. -I previously suggested Vitamin B complex and continue practice of circulation of her feet. I advised her to be careful to not fall.  -Stable, will continue to monitor, she will let uKoreaknow if this worsens.   7. Goal of care discussion  -We previously discussed the incurable nature of her cancer, and the overall poor prognosis, especially if she does not have good response to chemotherapy or progress on chemo -The patient understands the goal of care is palliative. -she is full code now    Plan -Lab reviewed, will hold daratumumab infusion today and give her a course of Cefprozil  -Will postpone her Dara to next week, and hold dexa next week  -I will see her in the infusion next week, and next office visit in 4 weeks   All questions were answered. The patient knows to call the clinic with any problems, questions or concerns. We can certainly see the patient much sooner if necessary.  I spent 20 minutes counseling the patient face to face. The total time spent in the appointment was 25 minutes.  IDierdre SearlesDweik am acting as scribe for Dr. YTruitt Merle  I have reviewed the above documentation for accuracy and completeness, and I agree with the above.     YTruitt Merle 04/07/2018

## 2018-04-02 NOTE — Telephone Encounter (Signed)
Per 9/19 phone msg return. Patient requested her appointment. To be changed , and Lacie okayed it. How ever due to appointment might have to be split patient declined new appointment and agreed to keep her current appointment.  She also agreed to keep her appointment for today.

## 2018-04-03 ENCOUNTER — Telehealth: Payer: Self-pay | Admitting: Hematology

## 2018-04-03 DIAGNOSIS — M25552 Pain in left hip: Secondary | ICD-10-CM | POA: Diagnosis not present

## 2018-04-03 LAB — PROTEIN ELECTROPHORESIS, SERUM
A/G Ratio: 1.7 (ref 0.7–1.7)
Albumin ELP: 3.5 g/dL (ref 2.9–4.4)
Alpha-1-Globulin: 0.3 g/dL (ref 0.0–0.4)
Alpha-2-Globulin: 0.7 g/dL (ref 0.4–1.0)
Beta Globulin: 0.7 g/dL (ref 0.7–1.3)
GLOBULIN, TOTAL: 2.1 g/dL — AB (ref 2.2–3.9)
Gamma Globulin: 0.4 g/dL (ref 0.4–1.8)
M-SPIKE, %: 0.1 g/dL — AB
TOTAL PROTEIN ELP: 5.6 g/dL — AB (ref 6.0–8.5)

## 2018-04-03 LAB — KAPPA/LAMBDA LIGHT CHAINS
KAPPA, LAMDA LIGHT CHAIN RATIO: 4 — AB (ref 0.26–1.65)
Kappa free light chain: 12.4 mg/L (ref 3.3–19.4)
LAMDA FREE LIGHT CHAINS: 3.1 mg/L — AB (ref 5.7–26.3)

## 2018-04-03 NOTE — Telephone Encounter (Signed)
No LOS 9/19

## 2018-04-06 ENCOUNTER — Encounter: Payer: Self-pay | Admitting: Nurse Practitioner

## 2018-04-06 ENCOUNTER — Other Ambulatory Visit: Payer: Self-pay | Admitting: Nurse Practitioner

## 2018-04-06 DIAGNOSIS — Z13220 Encounter for screening for lipoid disorders: Secondary | ICD-10-CM

## 2018-04-07 ENCOUNTER — Inpatient Hospital Stay: Payer: Medicare Other

## 2018-04-07 ENCOUNTER — Inpatient Hospital Stay (HOSPITAL_BASED_OUTPATIENT_CLINIC_OR_DEPARTMENT_OTHER): Payer: Medicare Other | Admitting: Hematology

## 2018-04-07 ENCOUNTER — Encounter: Payer: Self-pay | Admitting: Hematology

## 2018-04-07 DIAGNOSIS — J189 Pneumonia, unspecified organism: Secondary | ICD-10-CM

## 2018-04-07 DIAGNOSIS — Z13220 Encounter for screening for lipoid disorders: Secondary | ICD-10-CM

## 2018-04-07 DIAGNOSIS — I252 Old myocardial infarction: Secondary | ICD-10-CM

## 2018-04-07 DIAGNOSIS — D61818 Other pancytopenia: Secondary | ICD-10-CM | POA: Diagnosis not present

## 2018-04-07 DIAGNOSIS — I5042 Chronic combined systolic (congestive) and diastolic (congestive) heart failure: Secondary | ICD-10-CM | POA: Diagnosis not present

## 2018-04-07 DIAGNOSIS — Z8701 Personal history of pneumonia (recurrent): Secondary | ICD-10-CM

## 2018-04-07 DIAGNOSIS — R05 Cough: Secondary | ICD-10-CM | POA: Diagnosis not present

## 2018-04-07 DIAGNOSIS — Z923 Personal history of irradiation: Secondary | ICD-10-CM | POA: Diagnosis not present

## 2018-04-07 DIAGNOSIS — G620 Drug-induced polyneuropathy: Secondary | ICD-10-CM | POA: Diagnosis not present

## 2018-04-07 DIAGNOSIS — Z9484 Stem cells transplant status: Secondary | ICD-10-CM

## 2018-04-07 DIAGNOSIS — T451X5S Adverse effect of antineoplastic and immunosuppressive drugs, sequela: Secondary | ICD-10-CM

## 2018-04-07 DIAGNOSIS — Z5112 Encounter for antineoplastic immunotherapy: Secondary | ICD-10-CM | POA: Diagnosis not present

## 2018-04-07 DIAGNOSIS — M810 Age-related osteoporosis without current pathological fracture: Secondary | ICD-10-CM

## 2018-04-07 DIAGNOSIS — C9002 Multiple myeloma in relapse: Secondary | ICD-10-CM

## 2018-04-07 DIAGNOSIS — I429 Cardiomyopathy, unspecified: Secondary | ICD-10-CM

## 2018-04-07 DIAGNOSIS — Z79899 Other long term (current) drug therapy: Secondary | ICD-10-CM | POA: Diagnosis not present

## 2018-04-07 DIAGNOSIS — I509 Heart failure, unspecified: Secondary | ICD-10-CM

## 2018-04-07 DIAGNOSIS — M818 Other osteoporosis without current pathological fracture: Secondary | ICD-10-CM

## 2018-04-07 DIAGNOSIS — Z9221 Personal history of antineoplastic chemotherapy: Secondary | ICD-10-CM

## 2018-04-07 DIAGNOSIS — C9001 Multiple myeloma in remission: Secondary | ICD-10-CM

## 2018-04-07 DIAGNOSIS — I2510 Atherosclerotic heart disease of native coronary artery without angina pectoris: Secondary | ICD-10-CM

## 2018-04-07 DIAGNOSIS — R5383 Other fatigue: Secondary | ICD-10-CM | POA: Diagnosis not present

## 2018-04-07 LAB — CBC WITH DIFFERENTIAL (CANCER CENTER ONLY)
BASOS PCT: 0 %
Basophils Absolute: 0 10*3/uL (ref 0.0–0.1)
EOS ABS: 0 10*3/uL (ref 0.0–0.5)
Eosinophils Relative: 1 %
HEMATOCRIT: 32.5 % — AB (ref 34.8–46.6)
HEMOGLOBIN: 10.8 g/dL — AB (ref 11.6–15.9)
LYMPHS ABS: 0.4 10*3/uL — AB (ref 0.9–3.3)
Lymphocytes Relative: 11 %
MCH: 32.8 pg (ref 25.1–34.0)
MCHC: 33.2 g/dL (ref 31.5–36.0)
MCV: 98.8 fL (ref 79.5–101.0)
MONO ABS: 0.4 10*3/uL (ref 0.1–0.9)
MONOS PCT: 12 %
Neutro Abs: 2.5 10*3/uL (ref 1.5–6.5)
Neutrophils Relative %: 76 %
Platelet Count: 171 10*3/uL (ref 145–400)
RBC: 3.29 MIL/uL — ABNORMAL LOW (ref 3.70–5.45)
RDW: 16 % — AB (ref 11.2–14.5)
WBC Count: 3.3 10*3/uL — ABNORMAL LOW (ref 3.9–10.3)

## 2018-04-07 LAB — COMPREHENSIVE METABOLIC PANEL
ALK PHOS: 79 U/L (ref 38–126)
ALT: 14 U/L (ref 0–44)
AST: 17 U/L (ref 15–41)
Albumin: 3.4 g/dL — ABNORMAL LOW (ref 3.5–5.0)
Anion gap: 8 (ref 5–15)
BILIRUBIN TOTAL: 0.5 mg/dL (ref 0.3–1.2)
BUN: 14 mg/dL (ref 8–23)
CALCIUM: 8.7 mg/dL — AB (ref 8.9–10.3)
CO2: 24 mmol/L (ref 22–32)
Chloride: 106 mmol/L (ref 98–111)
Creatinine, Ser: 0.76 mg/dL (ref 0.44–1.00)
GFR calc non Af Amer: >60 mL/min (ref >60)
Glucose, Bld: 126 mg/dL — ABNORMAL HIGH (ref 70–99)
POTASSIUM: 3.8 mmol/L (ref 3.5–5.1)
SODIUM: 138 mmol/L (ref 135–145)
TOTAL PROTEIN: 6 g/dL — AB (ref 6.5–8.1)

## 2018-04-07 LAB — LIPID PANEL
CHOL/HDL RATIO: 2.8 ratio
CHOLESTEROL: 116 mg/dL (ref 0–200)
HDL: 42 mg/dL (ref >40)
LDL Cholesterol: 57 mg/dL (ref 0–99)
TRIGLYCERIDES: 86 mg/dL (ref <150)
VLDL: 17 mg/dL (ref 0–40)

## 2018-04-07 LAB — IFE, DARA-SPECIFIC, SERUM
IGG (IMMUNOGLOBIN G), SERUM: 503 mg/dL — AB (ref 700–1600)
IGM (IMMUNOGLOBULIN M), SRM: 18 mg/dL — AB (ref 26–217)
IgA: <5 mg/dL — ABNORMAL LOW (ref 64–422)

## 2018-04-07 MED ORDER — CEFPROZIL 500 MG PO TABS: 500.0000 mg | ORAL_TABLET | Freq: Two times a day (BID) | ORAL | 0 refills | Status: DC

## 2018-04-07 NOTE — Telephone Encounter (Signed)
Reached out to pt re: Aspirin. Left another message for her to call back.

## 2018-04-07 NOTE — Progress Notes (Signed)
Per Dr. Burr Medico pt. not to receive treatment today. Will reschedule for next week. Pt. to pick up prescribed antibiotic prescription to take for 5 days per MD.

## 2018-04-08 ENCOUNTER — Telehealth: Payer: Self-pay

## 2018-04-08 NOTE — Telephone Encounter (Signed)
Spoke with patient to inform her Dr. Burr Medico sent in antibiotic to [facility] for her to start taking, she verbalized an understanding.

## 2018-04-09 ENCOUNTER — Encounter: Payer: Self-pay | Admitting: Hematology

## 2018-04-09 ENCOUNTER — Ambulatory Visit: Payer: Medicare Other | Admitting: Hematology

## 2018-04-10 ENCOUNTER — Telehealth: Payer: Self-pay | Admitting: Hematology

## 2018-04-10 NOTE — Telephone Encounter (Signed)
Scheduled appt per 9/24 los - added treatment for next week on 10/4 - per YF okay - left message for patient with appt date and time,

## 2018-04-13 ENCOUNTER — Other Ambulatory Visit: Payer: Medicare Other

## 2018-04-13 ENCOUNTER — Ambulatory Visit: Payer: Medicare Other

## 2018-04-14 ENCOUNTER — Ambulatory Visit (INDEPENDENT_AMBULATORY_CARE_PROVIDER_SITE_OTHER): Payer: Medicare Other

## 2018-04-14 DIAGNOSIS — Z23 Encounter for immunization: Secondary | ICD-10-CM | POA: Diagnosis not present

## 2018-04-14 NOTE — Progress Notes (Signed)
Flu shot given in LD. Pt tolerated well. Ottis Stain, CMA

## 2018-04-17 ENCOUNTER — Inpatient Hospital Stay: Payer: Medicare Other

## 2018-04-17 ENCOUNTER — Other Ambulatory Visit: Payer: Self-pay | Admitting: Hematology

## 2018-04-17 ENCOUNTER — Inpatient Hospital Stay: Payer: Medicare Other | Attending: Hematology

## 2018-04-17 VITALS — BP 134/75 | HR 64 | Temp 97.5°F | Resp 18

## 2018-04-17 DIAGNOSIS — I429 Cardiomyopathy, unspecified: Secondary | ICD-10-CM | POA: Insufficient documentation

## 2018-04-17 DIAGNOSIS — Z9481 Bone marrow transplant status: Secondary | ICD-10-CM | POA: Insufficient documentation

## 2018-04-17 DIAGNOSIS — I2510 Atherosclerotic heart disease of native coronary artery without angina pectoris: Secondary | ICD-10-CM | POA: Insufficient documentation

## 2018-04-17 DIAGNOSIS — C9000 Multiple myeloma not having achieved remission: Secondary | ICD-10-CM

## 2018-04-17 DIAGNOSIS — D61818 Other pancytopenia: Secondary | ICD-10-CM | POA: Insufficient documentation

## 2018-04-17 DIAGNOSIS — I252 Old myocardial infarction: Secondary | ICD-10-CM | POA: Insufficient documentation

## 2018-04-17 DIAGNOSIS — C9001 Multiple myeloma in remission: Secondary | ICD-10-CM

## 2018-04-17 DIAGNOSIS — Z8701 Personal history of pneumonia (recurrent): Secondary | ICD-10-CM | POA: Diagnosis not present

## 2018-04-17 DIAGNOSIS — Z79899 Other long term (current) drug therapy: Secondary | ICD-10-CM | POA: Insufficient documentation

## 2018-04-17 DIAGNOSIS — Z8781 Personal history of (healed) traumatic fracture: Secondary | ICD-10-CM | POA: Diagnosis not present

## 2018-04-17 DIAGNOSIS — T451X5S Adverse effect of antineoplastic and immunosuppressive drugs, sequela: Secondary | ICD-10-CM | POA: Diagnosis not present

## 2018-04-17 DIAGNOSIS — G620 Drug-induced polyneuropathy: Secondary | ICD-10-CM | POA: Insufficient documentation

## 2018-04-17 DIAGNOSIS — Z9221 Personal history of antineoplastic chemotherapy: Secondary | ICD-10-CM | POA: Insufficient documentation

## 2018-04-17 DIAGNOSIS — C9002 Multiple myeloma in relapse: Secondary | ICD-10-CM | POA: Diagnosis not present

## 2018-04-17 DIAGNOSIS — Z923 Personal history of irradiation: Secondary | ICD-10-CM | POA: Insufficient documentation

## 2018-04-17 DIAGNOSIS — M810 Age-related osteoporosis without current pathological fracture: Secondary | ICD-10-CM | POA: Diagnosis not present

## 2018-04-17 DIAGNOSIS — I5042 Chronic combined systolic (congestive) and diastolic (congestive) heart failure: Secondary | ICD-10-CM | POA: Diagnosis not present

## 2018-04-17 DIAGNOSIS — Z5112 Encounter for antineoplastic immunotherapy: Secondary | ICD-10-CM | POA: Insufficient documentation

## 2018-04-17 DIAGNOSIS — Z87311 Personal history of (healed) other pathological fracture: Secondary | ICD-10-CM | POA: Diagnosis not present

## 2018-04-17 LAB — CBC WITH DIFFERENTIAL (CANCER CENTER ONLY)
BASOS ABS: 0 10*3/uL (ref 0.0–0.1)
BASOS PCT: 1 %
EOS ABS: 0 10*3/uL (ref 0.0–0.5)
EOS PCT: 1 %
HCT: 32.8 % — ABNORMAL LOW (ref 34.8–46.6)
Hemoglobin: 11.1 g/dL — ABNORMAL LOW (ref 11.6–15.9)
Lymphocytes Relative: 13 %
Lymphs Abs: 0.5 10*3/uL — ABNORMAL LOW (ref 0.9–3.3)
MCH: 33.5 pg (ref 25.1–34.0)
MCHC: 33.8 g/dL (ref 31.5–36.0)
MCV: 99.3 fL (ref 79.5–101.0)
MONO ABS: 0.3 10*3/uL (ref 0.1–0.9)
Monocytes Relative: 9 %
Neutro Abs: 2.7 10*3/uL (ref 1.5–6.5)
Neutrophils Relative %: 76 %
PLATELETS: 194 10*3/uL (ref 145–400)
RBC: 3.3 MIL/uL — AB (ref 3.70–5.45)
RDW: 16.2 % — AB (ref 11.2–14.5)
WBC Count: 3.6 10*3/uL — ABNORMAL LOW (ref 3.9–10.3)

## 2018-04-17 LAB — COMPREHENSIVE METABOLIC PANEL
ALT: 14 U/L (ref 0–44)
AST: 19 U/L (ref 15–41)
Albumin: 3.5 g/dL (ref 3.5–5.0)
Alkaline Phosphatase: 73 U/L (ref 38–126)
Anion gap: 10 (ref 5–15)
BUN: 17 mg/dL (ref 8–23)
CHLORIDE: 107 mmol/L (ref 98–111)
CO2: 24 mmol/L (ref 22–32)
CREATININE: 0.74 mg/dL (ref 0.44–1.00)
Calcium: 8.9 mg/dL (ref 8.9–10.3)
GFR calc Af Amer: >60 mL/min (ref >60)
Glucose, Bld: 87 mg/dL (ref 70–99)
POTASSIUM: 3.8 mmol/L (ref 3.5–5.1)
SODIUM: 141 mmol/L (ref 135–145)
Total Bilirubin: 0.5 mg/dL (ref 0.3–1.2)
Total Protein: 5.9 g/dL — ABNORMAL LOW (ref 6.5–8.1)

## 2018-04-17 MED ORDER — METHYLPREDNISOLONE SODIUM SUCC 125 MG IJ SOLR
125.0000 mg | Freq: Once | INTRAMUSCULAR | Status: AC
  Administered 2018-04-17: 125 mg via INTRAVENOUS

## 2018-04-17 MED ORDER — SODIUM CHLORIDE 0.9 % IV SOLN
16.0000 mg/kg | Freq: Once | INTRAVENOUS | Status: AC
  Administered 2018-04-17: 900 mg via INTRAVENOUS
  Filled 2018-04-17: qty 40

## 2018-04-17 MED ORDER — DIPHENHYDRAMINE HCL 25 MG PO CAPS
ORAL_CAPSULE | ORAL | Status: AC
  Filled 2018-04-17: qty 2

## 2018-04-17 MED ORDER — ACETAMINOPHEN 325 MG PO TABS
650.0000 mg | ORAL_TABLET | Freq: Once | ORAL | Status: AC
  Administered 2018-04-17: 650 mg via ORAL

## 2018-04-17 MED ORDER — MONTELUKAST SODIUM 10 MG PO TABS
ORAL_TABLET | ORAL | Status: AC
  Filled 2018-04-17: qty 1

## 2018-04-17 MED ORDER — DIPHENHYDRAMINE HCL 25 MG PO CAPS
50.0000 mg | ORAL_CAPSULE | Freq: Once | ORAL | Status: AC
  Administered 2018-04-17: 50 mg via ORAL

## 2018-04-17 MED ORDER — METHYLPREDNISOLONE SODIUM SUCC 125 MG IJ SOLR
INTRAMUSCULAR | Status: AC
  Filled 2018-04-17: qty 2

## 2018-04-17 MED ORDER — MONTELUKAST SODIUM 10 MG PO TABS
10.0000 mg | ORAL_TABLET | Freq: Once | ORAL | Status: AC
  Administered 2018-04-17: 10 mg via ORAL

## 2018-04-17 MED ORDER — SODIUM CHLORIDE 0.9 % IV SOLN
Freq: Once | INTRAVENOUS | Status: AC
  Administered 2018-04-17: 11:00:00 via INTRAVENOUS
  Filled 2018-04-17: qty 250

## 2018-04-17 MED ORDER — ACETAMINOPHEN 325 MG PO TABS
ORAL_TABLET | ORAL | Status: AC
  Filled 2018-04-17: qty 2

## 2018-04-17 NOTE — Patient Instructions (Signed)
[facility] Discharge Instructions for Patients Receiving Chemotherapy  Today you received the following chemotherapy agents :  daratumumab (Darzalex).  To help prevent nausea and vomiting after your treatment, we encourage you to take your nausea medication as prescribed.   If you develop nausea and vomiting that is not controlled by your nausea medication, call the clinic.   BELOW ARE SYMPTOMS THAT SHOULD BE REPORTED IMMEDIATELY:  *FEVER GREATER THAN 100.5 F  *CHILLS WITH OR WITHOUT FEVER  NAUSEA AND VOMITING THAT IS NOT CONTROLLED WITH YOUR NAUSEA MEDICATION  *UNUSUAL SHORTNESS OF BREATH  *UNUSUAL BRUISING OR BLEEDING  TENDERNESS IN MOUTH AND THROAT WITH OR WITHOUT PRESENCE OF ULCERS  *URINARY PROBLEMS  *BOWEL PROBLEMS  UNUSUAL RASH Items with * indicate a potential emergency and should be followed up as soon as possible.  Feel free to call the clinic should you have any questions or concerns. The clinic phone number is [phone].  Please show the CHEMO ALERT CARD at check-in to the Emergency Department and triage nurse.   

## 2018-04-20 LAB — UPEP/UIFE/LIGHT CHAINS/TP, 24-HR UR
% BETA, Urine: 0 %
ALBUMIN, U: 100 %
ALPHA 1 URINE: 0 %
Alpha 2, Urine: 0 %
FREE KAPPA LT CHAINS, UR: 32.8 mg/L — AB (ref 1.35–24.19)
Free Kappa/Lambda Ratio: 30.09 — ABNORMAL HIGH (ref 2.04–10.37)
Free Lambda Lt Chains,Ur: 1.09 mg/L (ref 0.24–6.66)
GAMMA GLOBULIN URINE: 0 %
TOTAL PROTEIN, URINE-UR/DAY: 65 mg/(24.h) (ref 30–150)
Total Protein, Urine: 5.9 mg/dL
Total Volume: 1100

## 2018-04-23 ENCOUNTER — Ambulatory Visit (HOSPITAL_COMMUNITY): Payer: Medicare Other | Attending: Internal Medicine

## 2018-04-23 ENCOUNTER — Other Ambulatory Visit: Payer: Self-pay

## 2018-04-23 ENCOUNTER — Encounter: Payer: Self-pay | Admitting: Hematology

## 2018-04-23 DIAGNOSIS — I2510 Atherosclerotic heart disease of native coronary artery without angina pectoris: Secondary | ICD-10-CM | POA: Insufficient documentation

## 2018-04-23 DIAGNOSIS — I5042 Chronic combined systolic (congestive) and diastolic (congestive) heart failure: Secondary | ICD-10-CM | POA: Insufficient documentation

## 2018-04-23 DIAGNOSIS — I429 Cardiomyopathy, unspecified: Secondary | ICD-10-CM | POA: Insufficient documentation

## 2018-04-23 DIAGNOSIS — C9000 Multiple myeloma not having achieved remission: Secondary | ICD-10-CM

## 2018-04-27 DIAGNOSIS — M25552 Pain in left hip: Secondary | ICD-10-CM | POA: Diagnosis not present

## 2018-04-30 ENCOUNTER — Other Ambulatory Visit: Payer: Self-pay | Admitting: Pharmacist

## 2018-04-30 NOTE — Patient Outreach (Signed)
[facility]) Care Management  04/30/2018  Shannon Rush [birth year] [id number]   77 year old female referred to [place] for medication review due to High Risk review of [employer] beneficiaries. PMHx includes, but not limited to, CHF, multiple myeloma, CAD hx STEMI   Patient was contacted today to review medications due to multiple [facility] codes (CHF) and elevated RAF score. Left HIPAA compliant message for patient to return my call at her convenience.   Plan - Will f/u in 3-5 business days if I have not heard back from patient   Catie Darnelle Maffucci, PharmD PGY2 [place] Resident, [place] Phone: [phone]

## 2018-04-30 NOTE — Addendum Note (Signed)
Addended by: De Hollingshead on: 04/30/2018 12:09 PM   Modules accepted: Orders

## 2018-04-30 NOTE — Patient Outreach (Addendum)
[facility]) Care Management  [place]   04/30/2018  Shannon Rush 04-10-1941 [id number]   77 year old female referred to [place] for medication review due to High Risk review of [employer] beneficiaries. PMHx includes, but not limited to, CHF, multiple myeloma, CAD hx STEMI believed to be related to chemotherapy.   Patient returned my call and verified HIPAA identifiers. She denies any questions or concerns about her medications, except for aspirin. She notes that she understands her heart disease to "not be serious" and her cardiac function to be "basically normal". She notes that she was previously on aspirin, but had stopped it due to increased bruising. However, she denies significant bruising or bleeds at this time.    She does note that she and her husband "received the call" about an annual wellness visit, but have decided to postpone scheduling these appointments as they do not have any medical concerns at this time.   Objective: Lab Results  Component Value Date   CREATININE 0.74 04/17/2018   CREATININE 0.76 04/07/2018   CREATININE 0.78 04/02/2018    Lab Results  Component Value Date   HGBA1C 5.2 12/31/2016    Lipid Panel     Component Value Date/Time   CHOL 116 04/07/2018 1249   CHOL 121 12/31/2016 1543   TRIG 86 04/07/2018 1249   HDL 42 04/07/2018 1249   HDL 62 12/31/2016 1543   CHOLHDL 2.8 04/07/2018 1249   VLDL 17 04/07/2018 1249   LDLCALC 57 04/07/2018 1249   LDLCALC 39 12/31/2016 1543    BP Readings from Last 3 Encounters:  04/17/18 134/75  04/07/18 122/81  04/02/18 121/70    Allergies  Allergen Reactions  . Zithromax [Azithromycin] Rash    Full body rash  . Zosyn [Piperacillin Sod-Tazobactam So] Swelling and Rash    Itching, rash and swelling of extremities Tolerates Rocephin  . Quinolones Rash    Cipro, Levaquin implicated Cipro also caused vomiting and abd pain    Medications Reviewed  Today    Reviewed by De Hollingshead, [facility] (Pharmacist) on 04/30/18 at 1336  Med List Status: <None>  Medication Order Taking? Sig Documenting Provider Last Dose Status Informant  acetaminophen (TYLENOL) 500 MG tablet [id number] No Take 500 mg by mouth every 6 (six) hours as needed for moderate pain or fever. [provider] Not Taking Active Self  acyclovir (ZOVIRAX) 400 MG tablet [id number] Yes Take 1 tablet (400 mg total) by mouth 2 (two) times daily. [name], MD Taking Active Self  aspirin EC 81 MG tablet [id number] Yes Take 1 tablet (81 mg total) by mouth daily. Charlie Pitter, PA-C Taking Active   atorvastatin (LIPITOR) 20 MG tablet [id number] Yes Take 1 tablet (20 mg total) by mouth daily. [name], MD Taking Active Self  B Complex-C (SUPER B COMPLEX PO) [id number] Yes Take 1 tablet by mouth daily. [provider] Taking Active Self           Med Note Darnelle Maffucci, Maralyn Sago Apr 30, 2018  1:34 PM) Plans to stop taking when she finishes her current supply  Calcium Carbonate-Vit D-Min (CALCIUM 600+D PLUS MINERALS) 600-400 MG-UNIT TABS [id number] Yes Take by mouth. [provider] Taking Active   dexamethasone (DECADRON) 4 MG tablet [id number] No Take 5 tab the day before chemo, every 2 weeks  Patient not taking:  Reported on 04/30/2018   [name], MD Not Taking Active   metoprolol succinate (  TOPROL-XL) 25 MG 24 hr tablet [id number] Yes Take 1 tablet (25 mg total) by mouth daily. [name], MD Taking Active Self           Med Note Loanne Drilling, DAVIUS C   Tue Jan 27, 2018 12:00 AM) Pt takes at 08:00  Multiple Vitamin (MULTIVITAMIN WITH MINERALS) TABS tablet [id number] Yes Take 1 tablet by mouth daily. Lavina Hamman, MD Taking Active Self           Med Note Nat Christen Apr 30, 2018  1:35 PM) Plans to stop taking when she finishes her current supply  polyethylene glycol (MIRALAX) packet [id number] No Take 17 g by mouth daily as needed.   Patient not taking:  Reported on 04/30/2018   Patrecia Pour, Christean Grief, MD Not Taking Active           Assessment:  Drugs sorted by system:  Cardiovascular: aspirin, atorvastatin, metoprolol succinate  Gastrointestinal: Miralax  Endocrine: dexamethasone  Pain: acetaminophen  Infectious Diseases: acyclovir,   Oncology: (also receiving daratumumab)  Vitamins/Minerals/Supplements: B complex, calcium/vitamin D, multivitamin  Aspirin Therapy - Had a long conversation about aspirin for primary prevention vs secondary prevention and recent guideline/literature recommendations supporting risk vs benefit. Encouraged her that if her cardiologist believed her CAD to warrant aspirin tx, she should continue to take it, particularly if she is not experiencing significant side effects. Encouraged adherence.    Plan: - Will route note to PCP for FYI - Patient has my contact information and can call with any further questions or concerns.   Catie Darnelle Maffucci, PharmD PGY2 [facility] Resident, [facility] Phone: [phone]

## 2018-05-04 ENCOUNTER — Other Ambulatory Visit: Payer: Medicare Other

## 2018-05-04 ENCOUNTER — Ambulatory Visit: Payer: Medicare Other

## 2018-05-04 ENCOUNTER — Ambulatory Visit: Payer: Medicare Other | Admitting: Hematology

## 2018-05-04 ENCOUNTER — Telehealth: Payer: Self-pay | Admitting: *Deleted

## 2018-05-04 DIAGNOSIS — I358 Other nonrheumatic aortic valve disorders: Secondary | ICD-10-CM

## 2018-05-04 DIAGNOSIS — C9002 Multiple myeloma in relapse: Secondary | ICD-10-CM

## 2018-05-04 DIAGNOSIS — I2510 Atherosclerotic heart disease of native coronary artery without angina pectoris: Secondary | ICD-10-CM

## 2018-05-04 DIAGNOSIS — I5042 Chronic combined systolic (congestive) and diastolic (congestive) heart failure: Secondary | ICD-10-CM

## 2018-05-04 DIAGNOSIS — I429 Cardiomyopathy, unspecified: Secondary | ICD-10-CM

## 2018-05-04 NOTE — Telephone Encounter (Signed)
-----   Message from Charlie Pitter, [place] sent at 05/04/2018  9:56 AM EDT ----- Mickel Baas covered my box while I was out. Please call Ms. Krebbs again and clarify that Dr. 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 I would not do anything right now." (The patient may be reviewing the report herself on MyChart which states it is different than previous although I want her to know Dr. Meda Coffee herself personally looked at it and feels it is similar. I also reached out to Dr. Meda Coffee to find out the answer of how often she still wants echos as it was previously mentioned as every 3 months. Dayna Dunn PA-C

## 2018-05-04 NOTE — Telephone Encounter (Signed)
Called pt re: echo results.  Left a message for her to call back.

## 2018-05-06 NOTE — Progress Notes (Signed)
[facility] ONCOLOGY OFFICE PROGRESS NOTE  PCP: [name], MD [place] [address]  Date of Service:  05/07/2018   DIAGNOSIS: Multiple myeloma with relapse  CHIEF COMPLAINTS: F/u of MM   Oncology History   Multiple myeloma ([place])   Staging form: Multiple Myeloma, AJCC 6th Edition   - Clinical: Stage IIIA - Signed by [name], MD on 09/04/2013      Multiple myeloma ([place])   05/26/2012 Initial Diagnosis    Presenting IgG was 4,040 mg/dL on 06/05/2012 (IgA 35; IgM 34); kappa free light chain 34.4 mg/dL, lambda 0.00, kappa:lambda ratio of 34.75; SPEP with M-spike of 2.60.    06/30/2012 Imaging    Numerous lytic lesions throughout the calvarium.  Lytic lesion within the left lateral scapula.  Findings most compatible with metastases or myeloma. Multiple mid and lower thoracic compression fractures.  Slight compression through the endplates at L3.    26/83/4196 Bone Marrow Biopsy    Bone marrow biopsy showed 49% plasma cell. Normal classical cytogenetics; however, myeloma FISH panel showed 13q- (intermediate risk)        07/14/2012 - 07/27/2012 Radiation Therapy    Palliative radiation 20 Gy over 10 fractions between to thoracic spine cord compression and symptomatic left scapula lytic lesion.    08/10/2012 - 11/2012 Chemotherapy    Started SQ Velcade once weekly, 3 weeks on, 1 weeks off; daily Revlimid d1-21, 7 days off; and Dexamethasone 2m PO weekly.     02/12/2013 Bone Marrow Transplant    [facility]     06/14/2013 Tumor Marker    IgG  1830,  Kappa:lamba ratio, 1.69 (baseline was 34.75 or   M-spike 0.28 (baseline of 2.6 or  89.3% of baseline).      06/25/2013 -  Chemotherapy    Started zometa 3.5 mg monthly     09/01/2013 - 01/2014 Chemotherapy    Maintenance therapy with revlimid 558mdaily for 14 days and then off for 7 (decreased from 21 days on and 7 days off based on neutropenia). Stopped due to disease  progression 01/2017    09/13/2013 - 09/17/2014 Hospital Admission    Hospital admission for pneumonia    12/20/2013 Treatment Plan Change    Maintenance therapy decreased to 2.5 mg daily for 21 days and then off for 7 days based on low counts.     01/25/2014 Tumor Marker    IgG 1360 M spike 0.5    03/01/2014 - 03/05/2014 Hospital Admission    [facility], received thrombolytic therapy, cath showed 60% prox RCA, mid-distal RCA 50%, Echo normal, no stents.    04/11/2014 - 08/07/2014 Chemotherapy    Continue Revlimid at 2.5 mg 3 weeks on 1 week off    08/08/2014 - 08/13/2014 Hospital Admission    Hospital admission for pneumonia. Her Revlimid was held.    08/29/2014 - 09/14/2014 Chemotherapy    Maintenance Revlimid restarted    09/14/2014 - 09/17/2014 Hospital Admission    [place]    11/13/2014 - 12/25/2015 Chemotherapy    Maintenance Revlimid restarted, changed to 2.2m21maily, 2 weks on, 1 week off from 12/15/2014, stopped due to disease progression     01/24/2016 Progression    Patient is M protein has gradually increased to 1.0g, repeated a bone marrow biopsy showed plasma cell 10-30%  02/27/2016 - 03/07/2016 Chemotherapy    Pomalidomide 4 mg daily on day 1-21, dexamethasone 40 mg on day 1, 8 and 15, every 28 days, started on 02/27/2016, daratumumab weekly started on 03/07/2016, held after first dose, due to hospitalization and a severe pancytopenia, pomalidomide was stopped afterwards     03/10/2016 - 03/29/2016 Hospital Admission    Pt was admitted for sepsis from pneumonia, and severe pancytopenia. She required ICU stay for a few days due to hypotension, developed b/l pleural effusion required diuretics and b/l thoracentesis. She also required blood and plt transfusion, and prolonged neupogen injection for severe neutropenia. She was discharged home after 3 weeks  hospital stay     04/26/2016 -  Chemotherapy    Daratumumab restarted on 04/26/2016, Velcade 1.33m/m2 and dexa 275mweekly start on 11/3, velcade held since 06/28/2016 due to CHF     08/26/2016 Echocardiogram    - Left ventricle: LVEF is approximately 35% with diffuse diffuse   hypokinesis with severe hypokinesis/ akinesis of the   inferior/inferoseptal walls. In comparison to echo images from   December 2017, there does not appear to be significant change The   cavity size was normal. Wall thickness was normal. Doppler   parameters are consistent with abnormal left ventricular   relaxation (grade 1 diastolic dysfunction). - Aortic valve: AV is thickened. 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 Present in echo from December 2017 There was   trivial regurgitation. - Right ventricle: Systolic function was mildly reduced. - Right atrium: The atrium was mildly dilated. - Pericardium, extracardiac: A trivial pericardial effusion was   identified.    12/03/2016 Echocardiogram    -Left Ventricle: Systolic function was   mildly to moderately reduced. The estimated ejection fraction was   in the range of 40% to 45%. Diffuse hypokinesis. Doppler   parameters are consistent with abnormal left ventricular   relaxation (grade 1 diastolic dysfunction). Doppler parameters   are consistent with indeterminate ventricular filling pressure. - Left atrium: The atrium was severely dilated. - Tricuspid valve: There was trivial regurgitation. - Pulmonary arteries: Systolic pressure was within the normal   range. PA peak pressure: 33 mm Hg (S). - Global longitudinal strain -10.5% (abnormal)    03/12/2017 Echocardiogram    ECHO 03/12/17 Study Conclusions - Left ventricle: The cavity size was normal. Wall thickness was   increased in a pattern of mild LVH. Systolic function was normal.   The estimated ejection fraction was in the range of 50%  to 55%.   There is hypokinesis of the basalinferior myocardium. Doppler   parameters are consistent with abnormal left ventricular   relaxation (grade 1 diastolic dysfunction). - Aortic valve: There was trivial regurgitation. - Mitral valve: Calcified annulus. Mildly thickened leaflets .   There was trivial regurgitation. - Tricuspid valve: There was mild regurgitation. Impressions: - There has been mild improvement in EF since prior study.     12/12/2017 - 12/15/2017 Hospital Admission    Admission diagnosis: Pnuemonia and acute hypoxic respiratory failure Additional comments: treated for pneumoania and acute hypoxic respiratory failure before she was discharged on 12/15/17 with oral anitbiotics. She did not require home oxygen.     12/30/2017 - 01/02/2018 Hospital Admission    Admission diagnosis: Pnuemonia  Additional comments: She was again hospitalized on 12/30/17 for pneumonia, ID work up was otherwise negative, she was discharged home with oral antibiotics.     04/01/2018 Imaging  04/01/2018 CXR IMPRESSION: Persisting airspace disease superimposed on chronic changes, suggesting pneumonia given the history. Followup PA and lateral chest X-ray is recommended in 3-4 weeks following therapy to ensure resolution and exclude underlying malignancy.  Redemonstration of mid and lower thoracic compression fractures with associated thoracic kyphosis.    04/23/2018 Echocardiogram    04/23/2018 ECHO LV EF: 45% -   50%      CURRENT TREATMENT:   1. Pomalidomide 4 mg daily on day 1-21, dexamethasone 40on day 1, 8 and 15, every 28 days, started on 02/27/2016, daratumumab weekly started on 03/07/2016, treatment on hold since 03/09/2016 due to hospitalization and severe cytopenia 2. Daratumumab restarted on 04/26/2016, Velcade 1.'3mg'$ /m2 and dexa '20mg'$  weekly start on 11/3, velcade held since 06/28/2016 due to CHF. Tonette Bihari was changed from every 4 weeks to every 2 weeks on 03/28/2017 per Dr.  Kendell Bane recommendation. Held after 11/21/17 treatment due to recurrent pnuemonia, restarted 01/09/18 at every 3-4 weeks.  INTERVAL HISTORY:  Shannon Rush 77 y.o. female here for a follow up. Last echocardiogram revealed LV EF: 45% -   50%. Today, she is here alone at the clinic. She is doing well. She bikes to the hospital and states that she's been working on her breathing exercises. She states that her weight fluctuates.  She states that she sometimes wakes up with dry mouth, and relates that to mouth breathing.  She plans to travel soon for Thanksgiving.    MEDICAL HISTORY: Past Medical History:  Diagnosis Date  . CAD (coronary artery disease)    a. inf STEMI (in [place] >>> [facility]) >>> LHC (8/15- [place]):  pRCA 60%, mid to dist RCA 50% >>> med rx  . Cardiomyopathy ([place])   . Chronic combined systolic and diastolic CHF (congestive heart failure) ([place])   . Cord compression ([place]) 07/13/12   MRI- diffuse myeloma involvement of T-L spine  . History of radiation therapy 07/13/12-07/27/12   spinal cord compression T3-T10,left scapula  . Hx of echocardiogram    Echo (03/02/14-done at [facility]):  Normal LVEF without significant regional wall motion abnormalities. Mild   . Multiple myeloma ([place]) 07/01/2012  . MVP (mitral valve prolapse)    a. trivial by echo 06/2017.  . OSTEOPOROSIS 06/11/2010   Multiple compression fractures; and spontaneous fracture of sternum Qualifier: Diagnosis of  By: [name], [name]     . Thoracic kyphosis 07/13/12   per MRI scan  . Unspecified deficiency anemia    ALLERGIES:  is allergic to zithromax [azithromycin]; zosyn [piperacillin sod-tazobactam so]; and quinolones.  SURGICAL HISTORY:  Past Surgical History:  Procedure Laterality Date  . APPENDECTOMY    . CESAREAN SECTION     x2   . COLONOSCOPY  2007   neg with Dr. [name]  . ELBOW SURGERY    . HIP SURGERY  2009   left  . TUBAL LIGATION     REVIEW  OF SYSTEMS:   Constitutional: Denies abnormal weight loss  Eyes: Denies blurriness of vision  Ears, nose, mouth, throat, and face: Denies mucositis   Respiratory: Denies dyspnea or wheezes (+) mouth breathing at night (+) mild dry cough Cardiovascular: Denies palpitation, chest discomfort  Gastrointestinal:  Denies heartburn   Skin: Denies abnormal skin rashes  Lymphatics: Denies new lymphadenopathy or easy bruising Neurological:Denies new weaknesses, numbness or tingling  Behavioral/Psych: Mood is stable, no new changes  All other systems were reviewed with the patient and are negative.   PHYSICAL EXAMINATION:  ECOG PERFORMANCE STATUS: 1 Vitals:   05/07/18 1336  BP: 125/69  Pulse: 96  Resp: 18  Temp: 98 F (36.7 C)  TempSrc: Oral  SpO2: 98%  Weight: 119 lb 8 oz (54.2 kg)  Height: _0  (1.702 m)    GENERAL:alert, no distress and comfortable; easily mobile to exam table; kyphosis, moderate.  SKIN: skin color, texture, turgor are normal, no rashes or significant lesions  EYES: normal, Conjunctiva are pink, 1 tear/injection blood vessel left eye in sclera. OROPHARYNX:no exudate, no erythema and lips, buccal mucosa, and tongue normal, no ONJ NECK: supple, thyroid normal size, non-tender, without nodularity; nodules cervical bilaterally.  LYMPH:  no palpable lymphadenopathy in the cervical, axillary or supraclavicular LUNGS: clear to precussion, no wheezing. (+) b/l crackles and wheezing on bases of lungs, chronic (+) dry cough  HEART: regular rate & rhythm and no murmurs  ABDOMEN:abdomen soft, non-tender and normal bowel sounds  Musculoskeletal:no cyanosis of digits and no clubbing, NEURO: alert & oriented x 3 with fluent speech, no focal motor/sensory deficits   Labs:  CBC Latest Ref Rng & Units 05/07/2018 04/17/2018 04/07/2018  WBC 4.0 - 10.5 K/uL 8.4 3.6(L) 3.3(L)  Hemoglobin 12.0 - 15.0 g/dL 10.3(L) 11.1(L) 10.8(L)  Hematocrit 36.0 - 46.0 % 31.6(L) 32.8(L) 32.5(L)   Platelets 150 - 400 K/uL 201 194 171    CMP Latest Ref Rng & Units 05/07/2018 04/17/2018 04/07/2018  Glucose 70 - 99 mg/dL 143(H) 87 126(H)  BUN 8 - 23 mg/dL _1 Creatinine 0.44 - 1.00 mg/dL 0.79 0.74 0.76  Sodium 135 - 145 mmol/L 142 141 138  Potassium 3.5 - 5.1 mmol/L 3.8 3.8 3.8  Chloride 98 - 111 mmol/L 108 107 106  CO2 22 - 32 mmol/L 21(L) 24 24  Calcium 8.9 - 10.3 mg/dL 9.6 8.9 8.7(L)  Total Protein 6.5 - 8.1 g/dL 6.4(L) 5.9(L) 6.0(L)  Total Bilirubin 0.3 - 1.2 mg/dL 0.5 0.5 0.5  Alkaline Phos 38 - 126 U/L 85 73 79  AST 15 - 41 U/L _2 ALT 0 - 44 U/L _3 SPEP M-PROTEIN  10/01/2013: 0.18 (nadir)  10/13/2014: 0.6 11/21/2014: 0.5 01/30/2015: 0.6 03/13/2015: 0.6 04/20/2015: 0.5 06/01/2015: 0.5 07/20/2015: 0.7 09/14/2015: 0.7 10/26/2015: 0.8 12/07/2015: 1.0 02/07/2016: 1.1  04/03/2016: 0.2 05/03/2016: 0.2  05/31/2016: 0.1 06/28/2016: 0.1 07/25/2016: 0.1 08/21/2016: 0.1 09/27/2016: 0.1 10/23/16: 0.1 12/27/2016: 0.1 (Dara Specific IFE was still positive) 01/24/17: 0.1 02/28/17: 0.1 03/28/17: 0.1 04/25/17: 0.1 05/23/17: 0.1 06/27/17: 0.1 08/01/17: 0.1 2:15/19: 0.1 09/26/17: 0.2 10/24/17: 0.1 11/21/17: 0.2 01/09/18: 0.2 01/29/18: 0.2 02/26/2018: 0.2 04/02/2018: 0.1  IgG ([phone]) 10/13/2014: 1710 11/21/2014: 1460 01/30/2015: 1380 03/13/15: 1570 04/20/2015: 1550 06/01/2015: 1590 07/22/2015: 1770 09/14/2015: 1787 10/26/2015: 1898 12/07/2015: 2045 02/07/2016: 1883 04/03/2016: 787 05/03/2016: 685  05/31/2016: 544 06/28/2016: 463 07/25/2016: 455 08/21/2016: 426 09/27/2016: 438 10/23/16: 374 12/06/16: 370 12/27/16: 343, 346 01/24/17: 332  02/28/17: 362 03/28/17:  436 04/25/17: 431 05/23/17: 471 06/27/17: 429 08/01/17: 392 08/29/17: 409 09/26/17: 421 10/24/17: 438 11/21/17: 465 01/09/18: 756 01/29/18: 638 02/26/18: 587 04/02/18: 503    Serum kappa, lambda light chains, and ratio  11/21/2014: 3.95, 2.08, 1.90  01/30/2015: 3.92, 2.64, 1.48 03/13/2015: 3.55, 2.17, 1.64 04/20/15:  4.52, 1.78, 2.54 06/01/2015: 4.82, 1.75, 2.75 07/20/2015: 8.71, 2.43, 3.58 09/14/2015: 9.17, 2.02, 4.55 10/26/2015: 11.4, 1.75, 6.42 12/07/2015: 1.54, 1.83, 8.44 02/07/2016: 1.93, 1.42, 13.6 04/03/2016: 0.96, 0.95, 1.01  05/03/2016: 0.71, 0.34, 2.09  05/31/2016: 0.48, 0.26, 1.85 06/28/2016: 0.52, 0.60, 0.87 07/25/2016:  0.46, 0.27, 1.70 08/21/2016: 0.43, 0.27, 1.59 09/27/2016: 0.52, 0.24, 2.17 10/23/16: .0.49, 0.35, 1.40 12/06/16: 0.68, 0.29, 2.34 12/27/16: 0.54, 0.29, 1.86 02/28/17: 0.56, 0.30, 1.87 04/11/17: 0.61, 0.29, 2.10 05/09/17: 0.57, 0.33, 1.73 06/13/17: 0.65, 0.23, 2.83 07/18/17: 0.75 0.26, 2.83 08/15/17: 0.78, 0.27, 2.89 08/29/17: 0.79, 0.3, 2.63 09/26/17: 0.88, 0.26, 3.38  10/24/17: 0.95, 0.36, 2.64 11/21/17: 0.99, 0.24, 4.13 01/09/18: 1.79, 1.01, 1.77 01/29/18: 12.9, 6.4, 2.02 02/26/2018: 12.7, 2.9, 4.38 04/02/2018: 12.4, 3.1, 4.00   24 hr urine PEP (total protein, M-spike) 10/13/2014: (-) 02/12/2016: 257m, 648m9/22/2017: 114 mg, (-) 08/08/16: 99, (-) 12/27/16: 5919m(-) 03/28/17: 41m66m-) 01/22/18: not observed 04/17/18: Not Observed  PATHOLOGY REPORT  Diagnosis 01/24/2016 Bone Marrow, Aspirate,Biopsy, and Clot, RT iliac and core - VARIABLY CELLULAR BONE MARROW WITH PLASMA CELL NEOPLASM. - SEE COMMENT. PERIPHERAL BLOOD: - MACROCYTIC ANEMIA.  Diagnosis Note The bone marrow is variably cellular with trilineage hematopoiesis and non specific myeloid changes. In this background, the plasma cell component is variable ranging from less than 10 % in many areas to 30% in some aspirate particles with relatively low cellularity. Foci of interstitial infiltrates and small clusters are also seen in the clot section. Immunohistochemical stains show kappa light chain restriction in atypical plasma cell clusters and infiltrates consistent with plasma cell neoplasm. Correlation with cytogenetic and FISH studies is recommended. (BNS:gt, 01/1306/27/2017iagnosis 03/28/16 PLEURAL FLUID, LEFT (SPECIMEN 1 OF  1 COLLECTED 03/28/16): REACTIVE MESOTHELIAL CELLS  RADIOGRAPHIC STUDIES:  04/01/2018 CXR IMPRESSION: Persisting airspace disease superimposed on chronic changes, suggesting pneumonia given the history. Followup PA and lateral chest X-ray is recommended in 3-4 weeks following therapy to ensure resolution and exclude underlying malignancy.  Redemonstration of mid and lower thoracic compression fractures with associated thoracic kyphosis.   04/23/2018 ECHO LV EF: 45% -   50%   06/26/2017 ECHO LV EF: 55% -   60%  ECHO 03/12/17 Study Conclusions - Left ventricle: The cavity size was normal. Wall thickness was   increased in a pattern of mild LVH. Systolic function was normal.   The estimated ejection fraction was in the range of 50% to 55%.   There is hypokinesis of the basalinferior myocardium. Doppler   parameters are consistent with abnormal left ventricular   relaxation (grade 1 diastolic dysfunction). - Aortic valve: There was trivial regurgitation. - Mitral valve: Calcified annulus. Mildly thickened leaflets .   There was trivial regurgitation. - Tricuspid valve: There was mild regurgitation. Impressions: - There has been mild improvement in EF since prior study.    Echo 12/03/16 -Left Ventricle: Systolic function was   mildly to moderately reduced. The estimated ejection fraction was   in the range of 40% to 45%. Diffuse hypokinesis. Doppler  parameters are consistent with abnormal left ventricular relaxation (grade 1 diastolic dysfunction). Doppler parameters are consistent with indeterminate ventricular filling pressure. - Left atrium: The atrium was severely dilated. - Tricuspid valve: There was trivial regurgitation. - Pulmonary arteries: Systolic pressure was within the normal   range. PA peak pressure: 33 mm Hg (S). - Global longitudinal strain -10.5% (abnormal)  Echo 08/26/2016 - Left ventricle: LVEF is approximately 35% with diffuse hypokinesis with severe  hypokinesis/ akinesis of the inferior/inferoseptal walls. In comparison to echo images from December 2017, there does not appear to be significant change The cavity size was normal. Wall thickness was normal. Doppler parameters are consistent with abnormal left ventricular relaxation (grade 1 diastolic dysfunction). - Aortic valve: AV is thickened. There is a small mobile  echodensity on ventricular surface consistent with possible fibroelastoma. Present in echo from December 2017 There was trivial regurgitation. - Right ventricle: Systolic function was mildly reduced. - Right atrium: The atrium was mildly dilated. - Pericardium, extracardiac: A trivial pericardial effusion was identified.  ASSESSMENT:   Shannon Rush 77 y.o. female with a history of Multiple Myeloma s/p autotransplant in August 2014 followed by maintenance low dose Revlimid therapy, and relapsed in 01/2016   1. IgG kappa Multiple myeloma s/p Auto SCT, intermediate risk, relapsed in 01/2016 --MM markers demonstrate a very good initial response (M protein baseline 2.6, down to 0.18 after induction chemo and transplant, stable around 0.5 since July 2015, but has gradually increased lately),  she unfortunately relapsed in July 2017 -I previously discussed her repeated bone marrow biopsy from 02/01/2016, which showed 10-30% plasma cells in her marrow -I agreed with Dr. Kendell Bane recommendation to stop Revlimid and change to Daratumumab, Pomalidomide and dexamethasone due to her disease relapse. I don't think she has resistance to Revlimid, she has been on very small dose of Revlimid due to her prior history of multiple infections. -She started first cycle DaraPD in 02/2016, but 2 weeks later she developed severe pancytopenia, pneumonia with sepsis required 3 weeks hospital stay. She did have a very nice response to the treatment, her M-protein has significantly reduced from 1.2 to 0.2, light chain levels normalized even with half cycle  treatment  -I previously recommended her to change regiment to DaraVD, and started her on dara every 2 weeks for 2 doses, she tolerated this very well -she has had good response to treatment, M-protein significantly dropped -Her previous echo in December 2017 showed a significant drop on EF, she is not much symptomatically for CHF, due to the potential contribution of Velcade to her CHF, I'll hold her Velcade for now. Her repeated echo in May 2018 showed a significant improvement, EF 40-45%. -her dara-specific IFE in 12/2016 was still positive, she has low level M-protein, she has achieved a good partial response. -She hasn't had Velcade since December 2017. Will continue holding it for now due to her CHF -We previously discussed her ECHO from 02/2017 and 06/2017 that showed her heart function has normalized to EF of 50% to 55% and 55-60% respectively.  -She has seen Dr. [name] at [facility] who suggests she use a combination treatment of Dara + low dose Pomalidomide with and increase treatment to every 2 weeks.  -Due to her previous severe cytopenia and infection, and a prolonged excellent response to single agent Dara, I did not recommend her to change to DaraPD again. We decided to change Dara/dexa treatment to every 2 weeks on 03/28/17, she has been tolerating well overall. Her M-protein level has been stable (0.1-0.2) since then  -If she has disease progression, I would consider adding Velcade back -Treatment was previously held 11/21/17-01/09/18 due to hospitalization from recurrent pneumonia. I previously discussed we will change her dura to every 3-4 weeks due to her recurrent infection.  -she has developed pneumonia again during her cruise trip in early Sep 2019  -She has recovered well, doing well clinically, clinical exam was unremarkable, lab reviewed, adequate for treatment, will proceed daratumumab today and continue every 3 weeks -Her M protein has been remained to be very low, 0.1, MM is well  controlled  -Lab f/u and daratumumab in 3 and 6 weeks   2. Recurrent pneumonia  -Presented after her cruise with productive cough with white-yellowish phlegm, congestion, upper abdominal soreness, diarrhea/watery  stool, , nausea, sore throat, no appetite, weight loss, fever and headaches. -Previous exam showed bilateral rales and rhonchi. She desatted to 85% on room air when she walks, highly suspicious for b/l pneumonia. -She previously had multiple episodes of pneumonia, which required ICU stay and prolonged hospital stay. She is immunocompromised due to her treatment -After ED recommendation she was admitted on 12/12/17 and was treated for pneumonia and acute hypoxic respiratory failure before she was discharged on 12/15/17 with oral antibiotics.  -She was again hospitalized on 12/30/17 as her pneumonia did not resolve. I previously suggested ID consult and workup to rule out opportunistic infections as she is immunocompromised. She was discharged on 01/02/18. Her ID workup was negative. -She again developed ammonia during her cruise trip in early September, she did complete a prolonged course of doxycycline, I previously gave her 5 days of cefprozil 563m bid, she did have allergy reaction to penicillin, but previously tolerated cephalosporins, such as Keflex. -she has recovered well -I encouraged her to use a spirometer at home, and take deep breaths   3. Pancytopenia  -secondary to Dara, slightly worse after we increased Dara to every 2 weeks  -Continue monitoring closely. -WBC at 8.4, Hg at 10.3 Lymph Ab 0.4 today   4. CHF, CAD, inferior STEMI in 02/2014 -continue metoprolol and lipitor, Dr. NMeda Coffeepreviously started her on lisinopril also -Dr. NMeda Coffeeplans to repeat her echo every 3 months while on treatment.  -I previously encouraged her to watch for leg swelling and SOB and chest pain as these are symptoms of her heart condition. -Her latest ECHO from 06/2017 shows improved heart function  with EF of 55-60%.   5. Osteoporosis with multiple fractures  -previous bone density scan showed worsening osteoporosis, continue Zometa, changed to every 3 months after 03/2015.  -She declined repeating DEXA  -She was on Zometa for 4 years, the risk probably outweighs the benefit so we stopped it, last injection was 10/23/16 -she recently had pelvic fracture after a fall in 01/2018  6. Peripheral neuropathy in feet, G1-2 -Secondary to previous chemotherapy  -We previously discussed the role of Neurontin, she declined. -I previously suggested Vitamin B complex and continue practice of circulation of her feet. I advised her to be careful to not fall.  -Stable, will continue to monitor, she will let u[place]ore[place]now if this worsens.   7. Goal of care discussion  -We previously discussed the incurable nature of her cancer, and the overall poor prognosis, especially if she does not have good response to chemotherapy or progress on chemo -The patient understands the goal of care is palliative. -she is full code now    Plan -She is clinically doing well, labs reviewed, adequate for daratumumab treatment and continue every 3 weeks  -lab f/u and daratumumab in 3 and 6 weeks    All questions were answered. The patient knows to call the clinic with any problems, questions or concerns. We can certainly see the patient much sooner if necessary.  I spent 20 minutes counseling the patient face to face. The total time spent in the appointment was 25 minutes.  IDierdre SearlesDweik am acting as scribe for Dr. YTruitt Merle  I have reviewed the above documentation for accuracy and completeness, and I agree with the above.    YTruitt Merle 05/07/2018

## 2018-05-07 ENCOUNTER — Encounter: Payer: Self-pay | Admitting: Hematology

## 2018-05-07 ENCOUNTER — Inpatient Hospital Stay: Payer: Medicare Other

## 2018-05-07 ENCOUNTER — Inpatient Hospital Stay (HOSPITAL_BASED_OUTPATIENT_CLINIC_OR_DEPARTMENT_OTHER): Payer: Medicare Other | Admitting: Hematology

## 2018-05-07 VITALS — BP 125/69 | HR 96 | Temp 98.0°F | Resp 18 | Ht 67.0 in | Wt 119.5 lb

## 2018-05-07 DIAGNOSIS — D61818 Other pancytopenia: Secondary | ICD-10-CM

## 2018-05-07 DIAGNOSIS — Z5112 Encounter for antineoplastic immunotherapy: Secondary | ICD-10-CM | POA: Diagnosis not present

## 2018-05-07 DIAGNOSIS — I252 Old myocardial infarction: Secondary | ICD-10-CM

## 2018-05-07 DIAGNOSIS — I2510 Atherosclerotic heart disease of native coronary artery without angina pectoris: Secondary | ICD-10-CM

## 2018-05-07 DIAGNOSIS — I429 Cardiomyopathy, unspecified: Secondary | ICD-10-CM | POA: Diagnosis not present

## 2018-05-07 DIAGNOSIS — C9000 Multiple myeloma not having achieved remission: Secondary | ICD-10-CM

## 2018-05-07 DIAGNOSIS — I5042 Chronic combined systolic (congestive) and diastolic (congestive) heart failure: Secondary | ICD-10-CM | POA: Diagnosis not present

## 2018-05-07 DIAGNOSIS — T451X5S Adverse effect of antineoplastic and immunosuppressive drugs, sequela: Secondary | ICD-10-CM

## 2018-05-07 DIAGNOSIS — C9001 Multiple myeloma in remission: Secondary | ICD-10-CM

## 2018-05-07 DIAGNOSIS — Z9481 Bone marrow transplant status: Secondary | ICD-10-CM

## 2018-05-07 DIAGNOSIS — C9002 Multiple myeloma in relapse: Secondary | ICD-10-CM

## 2018-05-07 DIAGNOSIS — M818 Other osteoporosis without current pathological fracture: Secondary | ICD-10-CM

## 2018-05-07 DIAGNOSIS — M810 Age-related osteoporosis without current pathological fracture: Secondary | ICD-10-CM

## 2018-05-07 DIAGNOSIS — Z8781 Personal history of (healed) traumatic fracture: Secondary | ICD-10-CM

## 2018-05-07 DIAGNOSIS — Z9221 Personal history of antineoplastic chemotherapy: Secondary | ICD-10-CM | POA: Diagnosis not present

## 2018-05-07 DIAGNOSIS — Z87311 Personal history of (healed) other pathological fracture: Secondary | ICD-10-CM | POA: Diagnosis not present

## 2018-05-07 DIAGNOSIS — G620 Drug-induced polyneuropathy: Secondary | ICD-10-CM

## 2018-05-07 DIAGNOSIS — Z79899 Other long term (current) drug therapy: Secondary | ICD-10-CM | POA: Diagnosis not present

## 2018-05-07 DIAGNOSIS — Z8701 Personal history of pneumonia (recurrent): Secondary | ICD-10-CM

## 2018-05-07 DIAGNOSIS — Z923 Personal history of irradiation: Secondary | ICD-10-CM | POA: Diagnosis not present

## 2018-05-07 LAB — CBC WITH DIFFERENTIAL (CANCER CENTER ONLY)
Abs Immature Granulocytes: 0.04 10*3/uL (ref 0.00–0.07)
BASOS ABS: 0 10*3/uL (ref 0.0–0.1)
BASOS PCT: 0 %
EOS ABS: 0 10*3/uL (ref 0.0–0.5)
Eosinophils Relative: 0 %
HCT: 31.6 % — ABNORMAL LOW (ref 36.0–46.0)
Hemoglobin: 10.3 g/dL — ABNORMAL LOW (ref 12.0–15.0)
Immature Granulocytes: 1 %
LYMPHS PCT: 4 %
Lymphs Abs: 0.4 10*3/uL — ABNORMAL LOW (ref 0.7–4.0)
MCH: 33.4 pg (ref 26.0–34.0)
MCHC: 32.6 g/dL (ref 30.0–36.0)
MCV: 102.6 fL — AB (ref 80.0–100.0)
Monocytes Absolute: 0.5 10*3/uL (ref 0.1–1.0)
Monocytes Relative: 6 %
NRBC: 0 % (ref 0.0–0.2)
Neutro Abs: 7.5 10*3/uL (ref 1.7–7.7)
Neutrophils Relative %: 89 %
PLATELETS: 201 10*3/uL (ref 150–400)
RBC: 3.08 MIL/uL — AB (ref 3.87–5.11)
RDW: 15.9 % — AB (ref 11.5–15.5)
WBC: 8.4 10*3/uL (ref 4.0–10.5)

## 2018-05-07 LAB — COMPREHENSIVE METABOLIC PANEL
ALT: 22 U/L (ref 0–44)
AST: 23 U/L (ref 15–41)
Albumin: 3.6 g/dL (ref 3.5–5.0)
Alkaline Phosphatase: 85 U/L (ref 38–126)
Anion gap: 13 (ref 5–15)
BILIRUBIN TOTAL: 0.5 mg/dL (ref 0.3–1.2)
BUN: 21 mg/dL (ref 8–23)
CHLORIDE: 108 mmol/L (ref 98–111)
CO2: 21 mmol/L — ABNORMAL LOW (ref 22–32)
Calcium: 9.6 mg/dL (ref 8.9–10.3)
Creatinine, Ser: 0.79 mg/dL (ref 0.44–1.00)
Glucose, Bld: 143 mg/dL — ABNORMAL HIGH (ref 70–99)
POTASSIUM: 3.8 mmol/L (ref 3.5–5.1)
Sodium: 142 mmol/L (ref 135–145)
Total Protein: 6.4 g/dL — ABNORMAL LOW (ref 6.5–8.1)

## 2018-05-07 MED ORDER — MONTELUKAST SODIUM 10 MG PO TABS
ORAL_TABLET | ORAL | Status: AC
  Filled 2018-05-07: qty 1

## 2018-05-07 MED ORDER — DIPHENHYDRAMINE HCL 25 MG PO CAPS
50.0000 mg | ORAL_CAPSULE | Freq: Once | ORAL | Status: AC
  Administered 2018-05-07: 50 mg via ORAL

## 2018-05-07 MED ORDER — DIPHENHYDRAMINE HCL 25 MG PO CAPS
ORAL_CAPSULE | ORAL | Status: AC
  Filled 2018-05-07: qty 2

## 2018-05-07 MED ORDER — METHYLPREDNISOLONE SODIUM SUCC 125 MG IJ SOLR
INTRAMUSCULAR | Status: AC
  Filled 2018-05-07: qty 2

## 2018-05-07 MED ORDER — SODIUM CHLORIDE 0.9 % IV SOLN
Freq: Once | INTRAVENOUS | Status: AC
  Administered 2018-05-07: 15:00:00 via INTRAVENOUS
  Filled 2018-05-07: qty 250

## 2018-05-07 MED ORDER — MONTELUKAST SODIUM 10 MG PO TABS
10.0000 mg | ORAL_TABLET | Freq: Once | ORAL | Status: AC
  Administered 2018-05-07: 10 mg via ORAL

## 2018-05-07 MED ORDER — METHYLPREDNISOLONE SODIUM SUCC 125 MG IJ SOLR
125.0000 mg | Freq: Once | INTRAMUSCULAR | Status: AC
  Administered 2018-05-07: 125 mg via INTRAVENOUS

## 2018-05-07 MED ORDER — ACETAMINOPHEN 325 MG PO TABS
650.0000 mg | ORAL_TABLET | Freq: Once | ORAL | Status: AC
  Administered 2018-05-07: 650 mg via ORAL

## 2018-05-07 MED ORDER — SODIUM CHLORIDE 0.9 % IV SOLN
16.0000 mg/kg | Freq: Once | INTRAVENOUS | Status: AC
  Administered 2018-05-07: 900 mg via INTRAVENOUS
  Filled 2018-05-07: qty 40

## 2018-05-07 MED ORDER — ACETAMINOPHEN 325 MG PO TABS
ORAL_TABLET | ORAL | Status: AC
  Filled 2018-05-07: qty 2

## 2018-05-07 NOTE — Patient Instructions (Signed)
[facility] Discharge Instructions for Patients Receiving Chemotherapy  Today you received the following chemotherapy agents: Darzalex  To help prevent nausea and vomiting after your treatment, we encourage you to take your nausea medication as directed.    If you develop nausea and vomiting that is not controlled by your nausea medication, call the clinic.   BELOW ARE SYMPTOMS THAT SHOULD BE REPORTED IMMEDIATELY:  *FEVER GREATER THAN 100.5 F  *CHILLS WITH OR WITHOUT FEVER  NAUSEA AND VOMITING THAT IS NOT CONTROLLED WITH YOUR NAUSEA MEDICATION  *UNUSUAL SHORTNESS OF BREATH  *UNUSUAL BRUISING OR BLEEDING  TENDERNESS IN MOUTH AND THROAT WITH OR WITHOUT PRESENCE OF ULCERS  *URINARY PROBLEMS  *BOWEL PROBLEMS  UNUSUAL RASH Items with * indicate a potential emergency and should be followed up as soon as possible.  Feel free to call the clinic should you have any questions or concerns. The clinic phone number is [phone].  Please show the CHEMO ALERT CARD at check-in to the Emergency Department and triage nurse.   

## 2018-05-08 ENCOUNTER — Telehealth: Payer: Self-pay | Admitting: Hematology

## 2018-05-08 LAB — IFE, DARA-SPECIFIC, SERUM
IGG (IMMUNOGLOBIN G), SERUM: 631 mg/dL — AB (ref 700–1600)
IgA: 5 mg/dL — ABNORMAL LOW (ref 64–422)
IgM (Immunoglobulin M), Srm: 41 mg/dL (ref 26–217)

## 2018-05-08 NOTE — Telephone Encounter (Signed)
Called and left voice message in regards to the patient appointment.  Mailing calendar for Nov and Dec 2019

## 2018-05-08 NOTE — Telephone Encounter (Signed)
Tried to reach pt re: echo results. Left another message for pt to call back.

## 2018-05-11 LAB — PROTEIN ELECTROPHORESIS, SERUM
A/G Ratio: 1.5 (ref 0.7–1.7)
Albumin ELP: 3.6 g/dL (ref 2.9–4.4)
Alpha-1-Globulin: 0.4 g/dL (ref 0.0–0.4)
Alpha-2-Globulin: 0.9 g/dL (ref 0.4–1.0)
Beta Globulin: 0.7 g/dL (ref 0.7–1.3)
Gamma Globulin: 0.5 g/dL (ref 0.4–1.8)
Globulin, Total: 2.4 g/dL (ref 2.2–3.9)
M-Spike, %: 0.2 g/dL — ABNORMAL HIGH
Total Protein ELP: 6 g/dL (ref 6.0–8.5)

## 2018-05-11 LAB — KAPPA/LAMBDA LIGHT CHAINS
KAPPA, LAMDA LIGHT CHAIN RATIO: 3.67 — AB (ref 0.26–1.65)
Kappa free light chain: 11 mg/L (ref 3.3–19.4)
LAMDA FREE LIGHT CHAINS: 3 mg/L — AB (ref 5.7–26.3)

## 2018-05-11 NOTE — Telephone Encounter (Signed)
Pt returned my call and she has been made aware that Dr. Meda Coffee has looked at her results as well and feels that it is similar to the one before. Pt understands we will repeat q3 months while on chemo. Order placed and someone in the scheduling dept will call pt with an appt in 07/2018.

## 2018-05-11 NOTE — Telephone Encounter (Signed)
Please call for Echo results, she can be reached for the next hour at [phone].  If after 1pm on cell [phone]

## 2018-05-14 ENCOUNTER — Telehealth: Payer: Self-pay | Admitting: Family Medicine

## 2018-05-14 MED ORDER — DOXYCYCLINE HYCLATE 100 MG PO TABS: 100.0000 mg | ORAL_TABLET | Freq: Two times a day (BID) | ORAL | 0 refills | Status: DC

## 2018-05-14 NOTE — Telephone Encounter (Signed)
Sick x 1 week.  Getting a little worse.  First fever was today.  Sx are cough and chest congestion.  Allergies noted.  Will start on empiric doxy.

## 2018-05-15 ENCOUNTER — Ambulatory Visit (HOSPITAL_COMMUNITY)
Admission: RE | Admit: 2018-05-15 | Discharge: 2018-05-15 | Disposition: A | Discharge status: Home / Self Care | Payer: Medicare Other | Source: Ambulatory Visit | Attending: Hematology | Admitting: Hematology

## 2018-05-15 DIAGNOSIS — R937 Abnormal findings on diagnostic imaging of other parts of musculoskeletal system: Secondary | ICD-10-CM | POA: Diagnosis not present

## 2018-05-15 DIAGNOSIS — M47892 Other spondylosis, cervical region: Secondary | ICD-10-CM | POA: Insufficient documentation

## 2018-05-15 DIAGNOSIS — M47894 Other spondylosis, thoracic region: Secondary | ICD-10-CM | POA: Diagnosis not present

## 2018-05-15 DIAGNOSIS — M47896 Other spondylosis, lumbar region: Secondary | ICD-10-CM | POA: Diagnosis not present

## 2018-05-15 DIAGNOSIS — C9000 Multiple myeloma not having achieved remission: Secondary | ICD-10-CM | POA: Diagnosis not present

## 2018-05-19 DIAGNOSIS — M25552 Pain in left hip: Secondary | ICD-10-CM | POA: Diagnosis not present

## 2018-05-22 ENCOUNTER — Encounter: Payer: Self-pay | Admitting: Cardiology

## 2018-05-26 ENCOUNTER — Other Ambulatory Visit: Payer: Self-pay | Admitting: Hematology

## 2018-05-26 DIAGNOSIS — D61818 Other pancytopenia: Secondary | ICD-10-CM

## 2018-05-26 DIAGNOSIS — I252 Old myocardial infarction: Secondary | ICD-10-CM

## 2018-05-26 DIAGNOSIS — C9000 Multiple myeloma not having achieved remission: Secondary | ICD-10-CM

## 2018-05-27 ENCOUNTER — Other Ambulatory Visit: Payer: Self-pay | Admitting: *Deleted

## 2018-05-27 DIAGNOSIS — D61818 Other pancytopenia: Secondary | ICD-10-CM

## 2018-05-27 DIAGNOSIS — I252 Old myocardial infarction: Secondary | ICD-10-CM

## 2018-05-27 DIAGNOSIS — C9000 Multiple myeloma not having achieved remission: Secondary | ICD-10-CM

## 2018-05-27 MED ORDER — ACYCLOVIR 400 MG PO TABS: 400.0000 mg | ORAL_TABLET | Freq: Two times a day (BID) | ORAL | 11 refills | Status: DC

## 2018-05-28 ENCOUNTER — Inpatient Hospital Stay: Payer: Medicare Other

## 2018-05-28 ENCOUNTER — Inpatient Hospital Stay: Payer: Medicare Other | Attending: Hematology

## 2018-05-28 ENCOUNTER — Encounter: Payer: Self-pay | Admitting: Hematology

## 2018-05-28 ENCOUNTER — Inpatient Hospital Stay (HOSPITAL_BASED_OUTPATIENT_CLINIC_OR_DEPARTMENT_OTHER): Payer: Medicare Other | Admitting: Hematology

## 2018-05-28 VITALS — BP 129/81 | HR 77 | Temp 97.5°F | Resp 18 | Ht 67.0 in | Wt 121.0 lb

## 2018-05-28 DIAGNOSIS — M8088XS Other osteoporosis with current pathological fracture, vertebra(e), sequela: Secondary | ICD-10-CM | POA: Insufficient documentation

## 2018-05-28 DIAGNOSIS — C9002 Multiple myeloma in relapse: Secondary | ICD-10-CM | POA: Diagnosis not present

## 2018-05-28 DIAGNOSIS — G620 Drug-induced polyneuropathy: Secondary | ICD-10-CM | POA: Insufficient documentation

## 2018-05-28 DIAGNOSIS — Z9481 Bone marrow transplant status: Secondary | ICD-10-CM | POA: Insufficient documentation

## 2018-05-28 DIAGNOSIS — I252 Old myocardial infarction: Secondary | ICD-10-CM

## 2018-05-28 DIAGNOSIS — I5042 Chronic combined systolic (congestive) and diastolic (congestive) heart failure: Secondary | ICD-10-CM

## 2018-05-28 DIAGNOSIS — Z8701 Personal history of pneumonia (recurrent): Secondary | ICD-10-CM | POA: Diagnosis not present

## 2018-05-28 DIAGNOSIS — D61818 Other pancytopenia: Secondary | ICD-10-CM | POA: Insufficient documentation

## 2018-05-28 DIAGNOSIS — I2510 Atherosclerotic heart disease of native coronary artery without angina pectoris: Secondary | ICD-10-CM

## 2018-05-28 DIAGNOSIS — X58XXXS Exposure to other specified factors, sequela: Secondary | ICD-10-CM

## 2018-05-28 DIAGNOSIS — Z9221 Personal history of antineoplastic chemotherapy: Secondary | ICD-10-CM | POA: Diagnosis not present

## 2018-05-28 DIAGNOSIS — T451X5S Adverse effect of antineoplastic and immunosuppressive drugs, sequela: Secondary | ICD-10-CM | POA: Diagnosis not present

## 2018-05-28 DIAGNOSIS — Z5112 Encounter for antineoplastic immunotherapy: Secondary | ICD-10-CM | POA: Diagnosis not present

## 2018-05-28 DIAGNOSIS — M818 Other osteoporosis without current pathological fracture: Secondary | ICD-10-CM

## 2018-05-28 DIAGNOSIS — Z923 Personal history of irradiation: Secondary | ICD-10-CM

## 2018-05-28 DIAGNOSIS — Z79899 Other long term (current) drug therapy: Secondary | ICD-10-CM | POA: Diagnosis not present

## 2018-05-28 DIAGNOSIS — C9000 Multiple myeloma not having achieved remission: Secondary | ICD-10-CM

## 2018-05-28 DIAGNOSIS — C9001 Multiple myeloma in remission: Secondary | ICD-10-CM

## 2018-05-28 LAB — CBC WITH DIFFERENTIAL (CANCER CENTER ONLY)
ABS IMMATURE GRANULOCYTES: 0.02 10*3/uL (ref 0.00–0.07)
Basophils Absolute: 0 10*3/uL (ref 0.0–0.1)
Basophils Relative: 0 %
Eosinophils Absolute: 0 10*3/uL (ref 0.0–0.5)
Eosinophils Relative: 0 %
HEMATOCRIT: 32.5 % — AB (ref 36.0–46.0)
HEMOGLOBIN: 10.7 g/dL — AB (ref 12.0–15.0)
IMMATURE GRANULOCYTES: 0 %
LYMPHS ABS: 0.7 10*3/uL (ref 0.7–4.0)
LYMPHS PCT: 8 %
MCH: 34.1 pg — AB (ref 26.0–34.0)
MCHC: 32.9 g/dL (ref 30.0–36.0)
MCV: 103.5 fL — AB (ref 80.0–100.0)
MONO ABS: 0.6 10*3/uL (ref 0.1–1.0)
MONOS PCT: 7 %
NEUTROS ABS: 7.4 10*3/uL (ref 1.7–7.7)
NEUTROS PCT: 85 %
PLATELETS: 252 10*3/uL (ref 150–400)
RBC: 3.14 MIL/uL — ABNORMAL LOW (ref 3.87–5.11)
RDW: 16 % — ABNORMAL HIGH (ref 11.5–15.5)
WBC Count: 8.7 10*3/uL (ref 4.0–10.5)
nRBC: 0 % (ref 0.0–0.2)

## 2018-05-28 LAB — COMPREHENSIVE METABOLIC PANEL
ALK PHOS: 80 U/L (ref 38–126)
ALT: 13 U/L (ref 0–44)
AST: 19 U/L (ref 15–41)
Albumin: 3.6 g/dL (ref 3.5–5.0)
Anion gap: 11 (ref 5–15)
BILIRUBIN TOTAL: 0.5 mg/dL (ref 0.3–1.2)
BUN: 20 mg/dL (ref 8–23)
CALCIUM: 9 mg/dL (ref 8.9–10.3)
CHLORIDE: 110 mmol/L (ref 98–111)
CO2: 23 mmol/L (ref 22–32)
CREATININE: 0.8 mg/dL (ref 0.44–1.00)
Glucose, Bld: 99 mg/dL (ref 70–99)
Potassium: 3.6 mmol/L (ref 3.5–5.1)
Sodium: 144 mmol/L (ref 135–145)
Total Protein: 6.3 g/dL — ABNORMAL LOW (ref 6.5–8.1)

## 2018-05-28 MED ORDER — DIPHENHYDRAMINE HCL 25 MG PO CAPS
ORAL_CAPSULE | ORAL | Status: AC
  Filled 2018-05-28: qty 2

## 2018-05-28 MED ORDER — METHYLPREDNISOLONE SODIUM SUCC 125 MG IJ SOLR
125.0000 mg | Freq: Once | INTRAMUSCULAR | Status: AC
  Administered 2018-05-28: 125 mg via INTRAVENOUS

## 2018-05-28 MED ORDER — MONTELUKAST SODIUM 10 MG PO TABS
10.0000 mg | ORAL_TABLET | Freq: Once | ORAL | Status: AC
  Administered 2018-05-28: 10 mg via ORAL

## 2018-05-28 MED ORDER — ACETAMINOPHEN 325 MG PO TABS
ORAL_TABLET | ORAL | Status: AC
  Filled 2018-05-28: qty 2

## 2018-05-28 MED ORDER — MONTELUKAST SODIUM 10 MG PO TABS
ORAL_TABLET | ORAL | Status: AC
  Filled 2018-05-28: qty 1

## 2018-05-28 MED ORDER — DIPHENHYDRAMINE HCL 25 MG PO CAPS
50.0000 mg | ORAL_CAPSULE | Freq: Once | ORAL | Status: AC
  Administered 2018-05-28: 50 mg via ORAL

## 2018-05-28 MED ORDER — METHYLPREDNISOLONE SODIUM SUCC 125 MG IJ SOLR
INTRAMUSCULAR | Status: AC
  Filled 2018-05-28: qty 2

## 2018-05-28 MED ORDER — SODIUM CHLORIDE 0.9 % IV SOLN
16.0000 mg/kg | Freq: Once | INTRAVENOUS | Status: AC
  Administered 2018-05-28: 900 mg via INTRAVENOUS
  Filled 2018-05-28: qty 40

## 2018-05-28 MED ORDER — SODIUM CHLORIDE 0.9 % IV SOLN
Freq: Once | INTRAVENOUS | Status: AC
  Administered 2018-05-28: 13:00:00 via INTRAVENOUS
  Filled 2018-05-28: qty 250

## 2018-05-28 MED ORDER — ACETAMINOPHEN 325 MG PO TABS
650.0000 mg | ORAL_TABLET | Freq: Once | ORAL | Status: AC
  Administered 2018-05-28: 650 mg via ORAL

## 2018-05-28 NOTE — Progress Notes (Signed)
Shannon Rush  PCP: Shannon Resides, MD [place] [address]  Date of Service:  05/28/2018   DIAGNOSIS: Multiple myeloma with relapse  CHIEF COMPLAINTS: F/u of MM   Oncology History   Multiple myeloma ([place])   Staging form: Multiple Myeloma, AJCC 6th Edition   - Clinical: Stage IIIA - Signed by [name], MD on 09/04/2013      Multiple myeloma ([place])   05/26/2012 Initial Diagnosis    Presenting IgG was 4,040 mg/dL on 06/05/2012 (IgA 35; IgM 34); kappa free light chain 34.4 mg/dL, lambda 0.00, kappa:lambda ratio of 34.75; SPEP with M-spike of 2.60.    06/30/2012 Imaging    Numerous lytic lesions throughout the calvarium.  Lytic lesion within the left lateral scapula.  Findings most compatible with metastases or myeloma. Multiple mid and lower thoracic compression fractures.  Slight compression through the endplates at L3.    34/19/6222 Bone Marrow Biopsy    Bone marrow biopsy showed 49% plasma cell. Normal classical cytogenetics; however, myeloma FISH panel showed 13q- (intermediate risk)        07/14/2012 - 07/27/2012 Radiation Therapy    Palliative radiation 20 Gy over 10 fractions between to thoracic spine cord compression and symptomatic left scapula lytic lesion.    08/10/2012 - 11/2012 Chemotherapy    Started SQ Velcade once weekly, 3 weeks on, 1 weeks off; daily Revlimid d1-21, 7 days off; and Dexamethasone 67m PO weekly.     02/12/2013 Bone Marrow Transplant    [facility]     06/14/2013 Tumor Marker    IgG  1830,  Kappa:lamba ratio, 1.69 (baseline was 34.75 or   M-spike 0.28 (baseline of 2.6 or  89.3% of baseline).      06/25/2013 -  Chemotherapy    Started zometa 3.5 mg monthly     09/01/2013 - 01/2014 Chemotherapy    Maintenance therapy with revlimid 577mdaily for 14 days and then off for 7 (decreased from 21 days on and 7 days off based on neutropenia). Stopped due to disease  progression 01/2017    09/13/2013 - 09/17/2014 Hospital Admission    Hospital admission for pneumonia    12/20/2013 Treatment Plan Change    Maintenance therapy decreased to 2.5 mg daily for 21 days and then off for 7 days based on low counts.     01/25/2014 Tumor Marker    IgG 1360 M spike 0.5    03/01/2014 - 03/05/2014 Hospital Ad[facility], received thrombolytic therapy, cath showed 60% prox RCA, mid-distal RCA 50%, Echo normal, no stents.    04/11/2014 - 08/07/2014 Chemotherapy    Continue Revlimid at 2.5 mg 3 weeks on 1 week off    08/08/2014 - 08/13/2014 Hospital Admission    Hospital admission for pneumonia. Her Revlimid was held.    08/29/2014 - 09/14/2014 Chemotherapy    Maintenance Revlimid restarted    09/14/2014 - 09/17/2014 Hospital Admission    [place]    11/13/2014 - 12/25/2015 Chemotherapy    Maintenance Revlimid restarted, changed to 2.55m57maily, 2 weks on, 1 week off from 12/15/2014, stopped due to disease progression     01/24/2016 Progression    Patient is M protein has gradually increased to 1.0g, repeated a bone marrow biopsy showed plasma cell 10-30%  02/27/2016 - 03/07/2016 Chemotherapy    Pomalidomide 4 mg daily on day 1-21, dexamethasone 40 mg on day 1, 8 and 15, every 28 days, started on 02/27/2016, daratumumab weekly started on 03/07/2016, held after first dose, due to hospitalization and a severe pancytopenia, pomalidomide was stopped afterwards     03/10/2016 - 03/29/2016 Hospital Admission    Pt was admitted for sepsis from pneumonia, and severe pancytopenia. She required ICU stay for a few days due to hypotension, developed b/l pleural effusion required diuretics and b/l thoracentesis. She also required blood and plt transfusion, and prolonged neupogen injection for severe neutropenia. She was discharged home after 3 weeks  hospital stay     04/26/2016 -  Chemotherapy    Daratumumab restarted on 04/26/2016, Velcade 1.33m/m2 and dexa 275mweekly start on 11/3, velcade held since 06/28/2016 due to CHF     08/26/2016 Echocardiogram    - Left ventricle: LVEF is approximately 35% with diffuse diffuse   hypokinesis with severe hypokinesis/ akinesis of the   inferior/inferoseptal walls. In comparison to echo images from   December 2017, there does not appear to be significant change The   cavity size was normal. Wall thickness was normal. Doppler   parameters are consistent with abnormal left ventricular   relaxation (grade 1 diastolic dysfunction). - Aortic valve: AV is thickened. 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 Present in echo from December 2017 There was   trivial regurgitation. - Right ventricle: Systolic function was mildly reduced. - Right atrium: The atrium was mildly dilated. - Pericardium, extracardiac: A trivial pericardial effusion was   identified.    12/03/2016 Echocardiogram    -Left Ventricle: Systolic function was   mildly to moderately reduced. The estimated ejection fraction was   in the range of 40% to 45%. Diffuse hypokinesis. Doppler   parameters are consistent with abnormal left ventricular   relaxation (grade 1 diastolic dysfunction). Doppler parameters   are consistent with indeterminate ventricular filling pressure. - Left atrium: The atrium was severely dilated. - Tricuspid valve: There was trivial regurgitation. - Pulmonary arteries: Systolic pressure was within the normal   range. PA peak pressure: 33 mm Hg (S). - Global longitudinal strain -10.5% (abnormal)    03/12/2017 Echocardiogram    ECHO 03/12/17 Study Conclusions - Left ventricle: The cavity size was normal. Wall thickness was   increased in a pattern of mild LVH. Systolic function was normal.   The estimated ejection fraction was in the range of 50%  to 55%.   There is hypokinesis of the basalinferior myocardium. Doppler   parameters are consistent with abnormal left ventricular   relaxation (grade 1 diastolic dysfunction). - Aortic valve: There was trivial regurgitation. - Mitral valve: Calcified annulus. Mildly thickened leaflets .   There was trivial regurgitation. - Tricuspid valve: There was mild regurgitation. Impressions: - There has been mild improvement in EF since prior study.     12/12/2017 - 12/15/2017 Hospital Admission    Admission diagnosis: Pnuemonia and acute hypoxic respiratory failure Additional comments: treated for pneumoania and acute hypoxic respiratory failure before she was discharged on 12/15/17 with oral anitbiotics. She did not require home oxygen.     12/30/2017 - 01/02/2018 Hospital Admission    Admission diagnosis: Pnuemonia  Additional comments: She was again hospitalized on 12/30/17 for pneumonia, ID work up was otherwise negative, she was discharged home with oral antibiotics.     04/01/2018 Imaging  04/01/2018 CXR IMPRESSION: Persisting airspace disease superimposed on chronic changes, suggesting pneumonia given the history. Followup PA and lateral chest X-ray is recommended in 3-4 weeks following therapy to ensure resolution and exclude underlying malignancy.  Redemonstration of mid and lower thoracic compression fractures with associated thoracic kyphosis.    04/23/2018 Echocardiogram    04/23/2018 ECHO LV EF: 45% -   50%      CURRENT TREATMENT:   1. Pomalidomide 4 mg daily on day 1-21, dexamethasone 40 on day 1, 8 and 15, every 28 days, started on 02/27/2016, daratumumab weekly started on 03/07/2016, treatment on hold since 03/09/2016 due to hospitalization and severe cytopenia 2. Daratumumab restarted on 04/26/2016, Velcade 1.54m/m2 and dexa 246mweekly start on 11/3, velcade held since 06/28/2016 due to CHF. DaTonette Biharias changed from every 4 weeks to every 2 weeks on 03/28/2017 per Dr.  GaKendell Baneecommendation. Held after 11/21/17 treatment due to recurrent pneumonia, restarted 01/09/18 at every 3-4 weeks.  INTERVAL HISTORY:  ClDEVANN CRIBB89.o. female here for a follow up. She presents to the clinic today by herself. She notes she is doing well overall.  She developed fever, cough and chest congestion after last cycle chemotherapy, she called her primary care physician on May 14, 2018, and was given a course of doxycycline.  Her symptoms resolved.  Her breathing practices have been working for her. She denies any GI issues or chest pain or discomfort. She is ready to proceed with Dara today.    MEDICAL HISTORY: Past Medical History:  Diagnosis Date  . CAD (coronary artery disease)    [place]  p[place] 60%, mid to dist RCA 50% >>> med rx  . Cardiomy[place] ([facility]  . Chronic combined systolic and diastolic CHF (congestive heart failure) ([place]  . Cord [place]   MRI- diffuse myeloma involvement of T-L spine  . History of radiation therapy 07/13/12-07/27/12   spinal cord compression T3-T10,left scapula  . Hx of echocardiogram    [facility]  Normal LVEF without significant regional wall motion abnormalities. Mild   . Multiple [place]oma ([place]  . MVP (mitral valve prolapse)    a. trivial by echo 06/2017.  . OSTEOPOROSIS 06/11/2010   Multiple compression fractures; and spontaneous fracture of sternum Qualifier: Diagnosis of  By: SaZebedee IbaP, SuManuela Schwartz   . Thoracic kyphosis 07/13/12   per MRI scan  . Unspecified deficiency anemia    ALLERGIES:  is allergic to zithromax [azithromycin]; zosyn [piperacillin sod-tazobactam so]; and quinolones.  SURGICAL HISTORY:  Past Surgical History:  Procedure Laterality Date  . APPENDECTOMY    . CESAREAN SECTION     x2   . COLONOSCOPY  2007   neg with Dr. MaWatt Climes. ELBOW SURGERY    . HIP  SURGERY  2009   left  . TUBAL LIGATION     REVIEW OF SYSTEMS:   Constitutional: Denies abnormal weight loss  Eyes: Denies blurriness of vision  Ears, nose, mouth, throat, and face: Denies mucositis   Respiratory: Denies dyspnea or wheezes  Cardiovascular: Denies palpitation, chest discomfort  Gastrointestinal:  Denies heartburn   Skin: Denies abnormal skin rashes  Lymphatics: Denies new lymphadenopathy or easy bruising Neurological:Denies new weaknesses, numbness or tingling  Behavioral/Psych: Mood is stable, no new changes  All other systems were reviewed with the patient and are negative.   PHYSICAL EXAMINATION:  ECOG PERFORMANCE STATUS: 1 Vitals:   05/28/18 1114  BP: 129/81  Pulse: 77  Resp: 18  Temp: (!) 97.5 F (36.4 C)  TempSrc: Oral  SpO2: 100%  Weight: 121 lb (54.9 kg)  Height: 5' 7" (1.702 m)    GENERAL:alert, no distress and comfortable; easily mobile to exam table; kyphosis, moderate.  SKIN: skin color, texture, turgor are normal, no rashes or significant lesions  EYES: normal, Conjunctiva are pink, 1 tear/injection blood vessel left eye in sclera. OROPHARYNX:no exudate, no erythema and lips, buccal mucosa, and tongue normal, no ONJ NECK: supple, thyroid normal size, non-tender, without nodularity; nodules cervical bilaterally.  LYMPH:  no palpable lymphadenopathy in the cervical, axillary or supraclavicular LUNGS: clear to percussion, no wheezing. (+) b/l crackles and wheezing on bases of lungs, chronic, improved.  HEART: regular rate & rhythm and no murmurs  ABDOMEN:abdomen soft, normal bowel sounds (+) mild tenderness Musculoskeletal:no cyanosis of digits and no clubbing, NEURO: alert & oriented x 3 with fluent speech, no focal motor/sensory deficits   Labs:  CBC Latest Ref Rng & Units 05/28/2018 05/07/2018 04/17/2018  WBC 4.0 - 10.5 K/uL 8.7 8.4 3.6(L)  Hemoglobin 12.0 - 15.0 g/dL 10.7(L) 10.3(L) 11.1(L)  Hematocrit 36.0 - 46.0 % 32.5(L) 31.6(L) 32.8(L)   Platelets 150 - 400 K/uL 252 201 194    CMP Latest Ref Rng & Units 05/28/2018 05/07/2018 04/17/2018  Glucose 70 - 99 mg/dL 99 143(H) 87  BUN 8 - 23 mg/dL _0 Creatinine 0.44 - 1.00 mg/dL 0.80 0.79 0.74  Sodium 135 - 145 mmol/L 144 142 141  Potassium 3.5 - 5.1 mmol/L 3.6 3.8 3.8  Chloride 98 - 111 mmol/L 110 108 107  CO2 22 - 32 mmol/L 23 21(L) 24  Calcium 8.9 - 10.3 mg/dL 9.0 9.6 8.9  Total Protein 6.5 - 8.1 g/dL 6.3(L) 6.4(L) 5.9(L)  Total Bilirubin 0.3 - 1.2 mg/dL 0.5 0.5 0.5  Alkaline Phos 38 - 126 U/L 80 85 73  AST 15 - 41 U/L _1 ALT 0 - 44 U/L _2 SPEP M-PROTEIN  10/01/2013: 0.18 (nadir)  10/13/2014: 0.6 11/21/2014: 0.5 01/30/2015: 0.6 03/13/2015: 0.6 04/20/2015: 0.5 06/01/2015: 0.5 07/20/2015: 0.7 09/14/2015: 0.7 10/26/2015: 0.8 12/07/2015: 1.0 02/07/2016: 1.1  04/03/2016: 0.2 05/03/2016: 0.2  05/31/2016: 0.1 06/28/2016: 0.1 07/25/2016: 0.1 08/21/2016: 0.1 09/27/2016: 0.1 10/23/16: 0.1 12/27/2016: 0.1 (Dara Specific IFE was still positive) 01/24/17: 0.1 02/28/17: 0.1 03/28/17: 0.1 04/25/17: 0.1 05/23/17: 0.1 06/27/17: 0.1 08/01/17: 0.1 2:15/19: 0.1 09/26/17: 0.2 10/24/17: 0.1 11/21/17: 0.2 01/09/18: 0.2 01/29/18: 0.2 02/26/2018: 0.2 04/02/2018: 0.1 05/07/18: 0.2  IgG ([phone]) 10/13/2014: 1710 11/21/2014: 1460 01/30/2015: 1380 03/13/15: 1570 04/20/2015: 1550 06/01/2015: 1590 07/22/2015: 1770 09/14/2015: 1787 10/26/2015: 1898 12/07/2015: 2045 02/07/2016: 1883 04/03/2016: 787 05/03/2016: 685  05/31/2016: 544 06/28/2016: 463 07/25/2016: 455 08/21/2016: 426 09/27/2016: 438 10/23/16: 374 12/06/16: 370 12/27/16: 343, 346 01/24/17: 332  02/28/17: 362 03/28/17:  436 04/25/17: 431 05/23/17: 471 06/27/17: 429 08/01/17: 392 08/29/17: 409 09/26/17: 421 10/24/17: 438 11/21/17: 465 01/09/18: 756 01/29/18: 638 02/26/18: 587 04/02/18: 503 05/07/18: 631  Serum kappa, lambda light chains, and ratio  11/21/2014: 3.95, 2.08, 1.90  01/30/2015: 3.92, 2.64, 1.48 03/13/2015: 3.55,  2.17, 1.64 04/20/15: 4.52, 1.78, 2.54 06/01/2015: 4.82, 1.75, 2.75 07/20/2015: 8.71, 2.43, 3.58 09/14/2015: 9.17, 2.02, 4.55 10/26/2015: 11.4, 1.75, 6.42 12/07/2015: 1.54, 1.83, 8.44 02/07/2016: 1.93, 1.42, 13.6 04/03/2016: 0.96, 0.95, 1.01  05/03/2016: 0.71, 0.34, 2.09  05/31/2016: 0.48, 0.26, 1.85 06/28/2016: 0.52, 0.60, 0.87 07/25/2016: 0.46,  0.27, 1.70 08/21/2016: 0.43, 0.27, 1.59 09/27/2016: 0.52, 0.24, 2.17 10/23/16: .0.49, 0.35, 1.40 12/06/16: 0.68, 0.29, 2.34 12/27/16: 0.54, 0.29, 1.86 02/28/17: 0.56, 0.30, 1.87 04/11/17: 0.61, 0.29, 2.10 05/09/17: 0.57, 0.33, 1.73 06/13/17: 0.65, 0.23, 2.83 07/18/17: 0.75 0.26, 2.83 08/15/17: 0.78, 0.27, 2.89 08/29/17: 0.79, 0.3, 2.63 09/26/17: 0.88, 0.26, 3.38  10/24/17: 0.95, 0.36, 2.64 11/21/17: 0.99, 0.24, 4.13 01/09/18: 1.79, 1.01, 1.77 01/29/18: 12.9, 6.4, 2.02 02/26/2018: 12.7, 2.9, 4.38 04/02/2018: 12.4, 3.1, 4.00 05/07/18: 11, 3, 3.67   24 hr urine PEP (total protein, M-spike) 10/13/2014: (-) 02/12/2016: 217m, 641m9/22/2017: 114 mg, (-) 08/08/16: 99, (-) 12/27/16: 592m(-) 03/28/17: 80m10m-) 01/22/18: not observed 04/17/18: Not Observed  PATHOLOGY REPORT  Diagnosis 01/24/2016 Bone Marrow, Aspirate,Biopsy, and Clot, RT iliac and core - VARIABLY CELLULAR BONE MARROW WITH PLASMA CELL NEOPLASM. - SEE COMMENT. PERIPHERAL BLOOD: - MACROCYTIC ANEMIA.  Diagnosis Rush The bone marrow is variably cellular with trilineage hematopoiesis and non specific myeloid changes. In this background, the plasma cell component is variable ranging from less than 10 % in many areas to 30% in some aspirate particles with relatively low cellularity. Foci of interstitial infiltrates and small clusters are also seen in the clot section. Immunohistochemical stains show kappa light chain restriction in atypical plasma cell clusters and infiltrates consistent with plasma cell neoplasm. Correlation with cytogenetic and FISH studies is recommended. (BNS:gt, 01/1306/15/2017iagnosis  03/28/16 PLEURAL FLUID, LEFT (SPECIMEN 1 OF 1 COLLECTED 03/28/16): REACTIVE MESOTHELIAL CELLS  RADIOGRAPHIC STUDIES:  04/01/2018 CXR IMPRESSION: Persisting airspace disease superimposed on chronic changes, suggesting pneumonia given the history. Followup PA and lateral chest X-ray is recommended in 3-4 weeks following therapy to ensure resolution and exclude underlying malignancy.  Redemonstration of mid and lower thoracic compression fractures with associated thoracic kyphosis.   04/23/2018 ECHO LV EF: 45% -   50%   06/26/2017 ECHO LV EF: 55% -   60%  ECHO 03/12/17 Study Conclusions - Left ventricle: The cavity size was normal. Wall thickness was   increased in a pattern of mild LVH. Systolic function was normal.   The estimated ejection fraction was in the range of 50% to 55%.   There is hypokinesis of the basalinferior myocardium. Doppler   parameters are consistent with abnormal left ventricular   relaxation (grade 1 diastolic dysfunction). - Aortic valve: There was trivial regurgitation. - Mitral valve: Calcified annulus. Mildly thickened leaflets .   There was trivial regurgitation. - Tricuspid valve: There was mild regurgitation. Impressions: - There has been mild improvement in EF since prior study.    Echo 12/03/16 -Left Ventricle: Systolic function was   mildly to moderately reduced. The estimated ejection fraction was   in the range of 40% to 45%. Diffuse hypokinesis. Doppler  parameters are consistent with abnormal left ventricular relaxation (grade 1 diastolic dysfunction). Doppler parameters are consistent with indeterminate ventricular filling pressure. - Left atrium: The atrium was severely dilated. - Tricuspid valve: There was trivial regurgitation. - Pulmonary arteries: Systolic pressure was within the normal   range. PA peak pressure: 33 mm Hg (S). - Global longitudinal strain -10.5% (abnormal)  Echo 08/26/2016 - Left ventricle: LVEF is  approximately 35% with diffuse hypokinesis with severe hypokinesis/ akinesis of the inferior/inferoseptal walls. In comparison to echo images from December 2017, there does not appear to be significant change The cavity size was normal. Wall thickness was normal. Doppler parameters are consistent with abnormal left ventricular relaxation (grade 1 diastolic dysfunction). - Aortic valve: AV is thickened. There is a  small mobile   echodensity on ventricular surface consistent with possible fibroelastoma. Present in echo from December 2017 There was trivial regurgitation. - Right ventricle: Systolic function was mildly reduced. - Right atrium: The atrium was mildly dilated. - Pericardium, extracardiac: A trivial pericardial effusion was identified.  ASSESSMENT:   Shannon Rush 77 y.o. female with a history of Multiple Myeloma s/p autotransplant in August 2014 followed by maintenance low dose Revlimid therapy, and relapsed in 01/2016   1. IgG kappa Multiple myeloma s/p Auto SCT, intermediate risk, relapsed in 01/2016 --MM markers demonstrate a very good initial response (M protein baseline 2.6, down to 0.18 after induction chemo and transplant, stable around 0.5 since July 2015, but has gradually increased lately),  she unfortunately relapsed in July 2017 -I previously discussed her repeated bone marrow biopsy from 02/01/2016, which showed 10-30% plasma cells in her marrow -I agreed with Dr. Kendell Bane recommendation to stop Revlimid and change to Daratumumab, Pomalidomide and dexamethasone due to her disease relapse. I don't think she has resistance to Revlimid, she has been on very small dose of Revlimid due to her prior history of multiple infections. -She started first cycle DaraPD in 02/2016, but 2 weeks later she developed severe pancytopenia, pneumonia with sepsis required 3 weeks hospital stay. She did have a very nice response to the treatment, her M-protein has significantly reduced from 1.2 to  0.2, light chain levels normalized even with half cycle treatment  -I previously recommended her to change regiment to DaraVD, and started her on dara every 2 weeks for 2 doses, she tolerated this very well -she has had good response to treatment, M-protein significantly dropped -Her previous echo in December 2017 showed a significant drop on EF, she is not much symptomatically for CHF, due to the potential contribution of Velcade to her CHF, I'll hold her Velcade for now. Her repeated echo in May 2018 showed a significant improvement, EF 40-45%. -her dara-specific IFE in 12/2016 was still positive, she has low level M-protein, she has achieved a good partial response. -She hasn't had Velcade since December 2017. Will continue holding it for now due to her CHF -We previously discussed her ECHO from 02/2017 and 06/2017 that showed her heart function has normalized to EF of 50% to 55% and 55-60% respectively.  -She has seen [facility] [name] at [facility] who suggests she use a combination treatment of Dara + low dose Pomalidomide with and increase treatment to every 2 weeks.  -Due to her previous severe cytopenia and infection, and a prolonged excellent response to single agent Dara, I did not recommend her to change to DaraPD again. We decided to change Dara/dexa treatment to every 2 weeks on 03/28/17, she has been tolerating well overall. Her M-protein level has been stable (0.1-0.2) since then  -If she has disease progression, I would consider adding Velcade back -Treatment was previously held 11/21/17-01/09/18 due to hospitalization from recurrent pneumonia. I previously discussed we will change her dura to every 3-4 weeks due to her recurrent infection.  -She previously developed pneumonia again during her cruise trip in early Sep 2019, and episode of fever and cough in late Oct 2019, she was treated with oral antibiotics as outpatient, and recovered well. -She is doing well clinically. Physical exam was  unremarkbale, Lab reviewed, adequate for treatment, will proceed daratumumab today and continue every 3 weeks. Her disease is being better controlled now.  -She has had her flu shot for this year.  -F/u in 3 weeks  2. Recurrent pneumonia  -Presented after her cruise with productive cough with white-yellowish phlegm, congestion, upper abdominal soreness, diarrhea/watery stool, , nausea, sore throat, no appetite, weight loss, fever and headaches. -Previous exam showed bilateral rales and rhonchi. She desatted to 85% on room air when she walks, highly suspicious for b/l pneumonia. -She previously had multiple episodes of pneumonia, which required ICU stay and prolonged hospital stay. She is immunocompromised due to her treatment -After ED recommendation she was admitted on 12/12/17 and was treated for pneumonia and acute hypoxic respiratory failure before she was discharged on 12/15/17 with oral antibiotics.  -She was again hospitalized on 12/30/17 as her pneumonia did not resolve. I previously suggested ID consult and workup to rule out opportunistic infections as she is immunocompromised. She was discharged on 01/02/18. Her ID workup was negative. -She again developed pneumonia during her cruise trip in early September, she did complete a prolonged course of doxycycline, I previously gave her 5 days of cefprozil 555m bid, she did have allergy reaction to penicillin, but previously tolerated cephalosporins, such as Keflex. -she had one episode of fever and cough in late October 2019, treated with doxycycline, responded well. -she has recovered well -I encouraged her to use a spirometer at home, and take deep breaths   3. Pancytopenia  -secondary to Dara, slightly worse after we increased Dara to every 2 weeks  -Continue monitoring closely. -Improved: WBC at 8.7, hg at 10.7, PLT at 252K  4. CHF, CAD, inferior STEMI in 02/2014 -continue metoprolol and lipitor, Dr. NMeda Coffeepreviously started her on  lisinopril also -Dr. NMeda Coffeeplans to repeat her echo every 3 months while on treatment.  -I previously encouraged her to watch for leg swelling and SOB and chest pain as these are symptoms of her heart condition. -Her latest ECHO from 06/2017 shows improved heart function with EF of 55-60%.   5. Osteoporosis with multiple fractures  -previous bone density scan showed worsening osteoporosis, continue Zometa, changed to every 3 months after 03/2015.  -She declined repeating DEXA  -She was on Zometa for 4 years, the risk probably outweighs the benefit so we stopped it, last injection was 10/23/16 -she recently had pelvic fracture after a fall in 01/2018  6. Peripheral neuropathy in feet, G1-2 -Secondary to previous chemotherapy  -We previously discussed the role of Neurontin, she declined. -I previously suggested Vitamin B complex and continue practice of circulation of her feet. I advised her to be careful to not fall.  -Stable, will continue to monitor, she will let uKoreaknow if this worsens.   7. Goal of care discussion  -We previously discussed the incurable nature of her cancer, and the overall poor prognosis, especially if she does not have good response to chemotherapy or progress on chemo -The patient understands the goal of care is palliative. -she is full code now    Plan -She is clinically doing well, labs reviewed, adequate for daratumumab treatment and continue every 3 weeks, takes dexamethasone the day before infusion. -next lab, flu and daratumumab on 12/5    All questions were answered. The patient knows to call the clinic with any problems, questions or concerns. We can certainly see the patient much sooner if necessary.  I spent 20 minutes counseling the patient face to face. The total time spent in the appointment was 25 minutes.  IOneal Deputy am acting as scribe for YTruitt Merle MD.   I have reviewed the above documentation for accuracy and completeness, and I agree  with the above.    [name]  05/28/2018

## 2018-05-28 NOTE — Telephone Encounter (Signed)
Already refilled 11/13

## 2018-05-28 NOTE — Patient Instructions (Signed)
[facility] Discharge Instructions for Patients Receiving Chemotherapy  Today you received the following chemotherapy agents: Darzalex  To help prevent nausea and vomiting after your treatment, we encourage you to take your nausea medication as directed.    If you develop nausea and vomiting that is not controlled by your nausea medication, call the clinic.   BELOW ARE SYMPTOMS THAT SHOULD BE REPORTED IMMEDIATELY:  *FEVER GREATER THAN 100.5 F  *CHILLS WITH OR WITHOUT FEVER  NAUSEA AND VOMITING THAT IS NOT CONTROLLED WITH YOUR NAUSEA MEDICATION  *UNUSUAL SHORTNESS OF BREATH  *UNUSUAL BRUISING OR BLEEDING  TENDERNESS IN MOUTH AND THROAT WITH OR WITHOUT PRESENCE OF ULCERS  *URINARY PROBLEMS  *BOWEL PROBLEMS  UNUSUAL RASH Items with * indicate a potential emergency and should be followed up as soon as possible.  Feel free to call the clinic should you have any questions or concerns. The clinic phone number is [phone].  Please show the CHEMO ALERT CARD at check-in to the Emergency Department and triage nurse.   

## 2018-05-29 ENCOUNTER — Telehealth: Payer: Self-pay | Admitting: Hematology

## 2018-05-29 NOTE — Telephone Encounter (Signed)
LVm for pt regarding appts

## 2018-06-01 ENCOUNTER — Encounter: Payer: Self-pay | Admitting: Hematology

## 2018-06-03 MED ORDER — DOXYCYCLINE HYCLATE 100 MG PO TABS: 100.0000 mg | ORAL_TABLET | Freq: Two times a day (BID) | ORAL | 0 refills | Status: DC

## 2018-06-16 DIAGNOSIS — M25552 Pain in left hip: Secondary | ICD-10-CM | POA: Diagnosis not present

## 2018-06-17 NOTE — Progress Notes (Addendum)
[facility]   Telephone:([phone]) [phone] [phone]) [phone]   Clinic Follow up Note   Patient Care Team: [name], MD as PCP - General (Family Medicine) [name], MD as PCP - Cardiology (Cardiology) Stefanie Libel, MD (Family Medicine) [name], MD as Attending Physician (Obstetrics and Gynecology) [name], MD as Consulting Physician (Internal Medicine) [name], MD as Consulting Physician (Hematology) [name], MD as Consulting Physician (Ophthalmology) [name], MD as Consulting Physician (Cardiology)  Date of Service:  06/18/2018  CHIEF COMPLAINT: F/u for Multiple Myeloma   SUMMARY OF ONCOLOGIC HISTORY: Oncology History   Multiple myeloma [facility])   Staging form: Multiple Myeloma, AJCC 6th Edition   - Clinical: Stage IIIA - Signed by [name], MD on 09/04/2013      Multiple myeloma ([place])   05/26/2012 Initial Diagnosis    Presenting IgG was 4,040 mg/dL on 06/05/2012 (IgA 35; IgM 34); kappa free light chain 34.4 mg/dL, lambda 0.00, kappa:lambda ratio of 34.75; SPEP with M-spike of 2.60.    06/30/2012 Imaging    Numerous lytic lesions throughout the calvarium.  Lytic lesion within the left lateral scapula.  Findings most compatible with metastases or myeloma. Multiple mid and lower thoracic compression fractures.  Slight compression through the endplates at L3.    70/62/3762 Bone Marrow Biopsy    Bone marrow biopsy showed 49% plasma cell. Normal classical cytogenetics; however, myeloma FISH panel showed 13q- (intermediate risk)        07/14/2012 - 07/27/2012 Radiation Therapy    Palliative radiation 20 Gy over 10 fractions between to thoracic spine cord compression and symptomatic left scapula lytic lesion.    08/10/2012 - 11/2012 Chemotherapy    Started SQ Velcade once weekly, 3 weeks on, 1 weeks off; daily Revlimid d1-21, 7 days off; and Dexamethasone 24m PO weekly.     02/12/2013 Bone Marrow Transplant   [facility]     06/14/2013 Tumor Marker    IgG  1830,  Kappa:lamba ratio, 1.69 (baseline was 34.75 or   M-spike 0.28 (baseline of 2.6 or  89.3% of baseline).      06/25/2013 -  Chemotherapy    Started zometa 3.5 mg monthly     09/01/2013 - 01/2014 Chemotherapy    Maintenance therapy with revlimid 5103mdaily for 14 days and then off for 7 (decreased from 21 days on and 7 days off based on neutropenia). Stopped due to disease progression 01/2017    09/13/2013 - 09/17/2014 Hospital Admission    Hospital admission for pneumonia    12/20/2013 Treatment Plan Change    Maintenance therapy decreased to 2.5 mg daily for 21 days and then off for 7 days based on low counts.     01/25/2014 Tumor Marker    IgG 1360 M spike 0.5    03/01/2014 - 03/05/2014 Hospital Ad[facility]    [facility] received thrombolytic therapy, cath showed 60% prox RCA, mid-distal RCA 50%, Echo normal, no stents.    04/11/2014 - 08/07/2014 Chemotherapy    Continue Revlimid at 2.5 mg 3 weeks on 1 week off    08/08/2014 - 08/13/2014 Hospital Admission    Hospital admission for pneumonia. Her Revlimid was held.    08/29/2014 - 09/14/2014 Chemotherapy    Maintenance Revlimid restarted    09/14/2014 - 09/17/2014 Hospital Admission    [place]    11/13/2014 - 12/25/2015 Chemotherapy    Maintenance Revlimid restarted, changed to 2.72m daily, 2 weks on, 1 week off from 12/15/2014, stopped due to disease progression     01/24/2016 Progression    Patient is M protein has gradually increased to 1.0g, repeated a bone marrow biopsy showed plasma cell 10-30%    02/27/2016 - 03/07/2016 Chemotherapy    Pomalidomide 4 mg daily on day 1-21, dexamethasone 40 mg on day 1, 8 and 15, every 28 days, started on 02/27/2016, daratumumab weekly started on 03/07/2016, held after first dose, due to hospitalization  and a severe pancytopenia, pomalidomide was stopped afterwards     03/10/2016 - 03/29/2016 Hospital Admission    Pt was admitted for sepsis from pneumonia, and severe pancytopenia. She required ICU stay for a few days due to hypotension, developed b/l pleural effusion required diuretics and b/l thoracentesis. She also required blood and plt transfusion, and prolonged neupogen injection for severe neutropenia. She was discharged home after 3 weeks hospital stay     04/26/2016 -  Chemotherapy    Daratumumab restarted on 04/26/2016, Velcade 1.39mm2 and dexa 2078meekly start on 11/3, velcade held since 06/28/2016 due to CHF     08/26/2016 Echocardiogram    - Left ventricle: LVEF is approximately 35% with diffuse diffuse   hypokinesis with severe hypokinesis/ akinesis of the   inferior/inferoseptal walls. In comparison to echo images from   December 2017, there does not appear to be significant change The   cavity size was normal. Wall thickness was normal. Doppler   parameters are consistent with abnormal left ventricular   relaxation (grade 1 diastolic dysfunction). - Aortic valve: AV is thickened. 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 Present in echo from December 2017 There was   trivial regurgitation. - Right ventricle: Systolic function was mildly reduced. - Right atrium: The atrium was mildly dilated. - Pericardium, extracardiac: A trivial pericardial effusion was   identified.    12/03/2016 Echocardiogram    -Left Ventricle: Systolic function was   mildly to moderately reduced. The estimated ejection fraction was   in the range of 40% to 45%. Diffuse hypokinesis. Doppler   parameters are consistent with abnormal left ventricular   relaxation (grade 1 diastolic dysfunction). Doppler parameters   are consistent with indeterminate ventricular filling pressure. - Left atrium: The atrium was severely dilated. - Tricuspid  valve: There was trivial regurgitation. - Pulmonary arteries: Systolic pressure was within the normal   range. PA peak pressure: 33 mm Hg (S). - Global longitudinal strain -10.5% (abnormal)    03/12/2017 Echocardiogram    ECHO 03/12/17 Study Conclusions - Left ventricle: The cavity size was normal. Wall thickness was   increased in a pattern of mild LVH. Systolic function was normal.   The estimated ejection fraction was in the range of 50% to 55%.   There is hypokinesis of the basalinferior myocardium. Doppler   parameters are consistent with abnormal left ventricular   relaxation (grade 1 diastolic dysfunction). - Aortic valve: There was trivial regurgitation. - Mitral valve: Calcified annulus. Mildly thickened leaflets .   There was trivial regurgitation. - Tricuspid valve: There was mild regurgitation. Impressions: - There has been mild improvement in EF since prior study.     12/12/2017 - 12/15/2017 Hospital Admission    Admission diagnosis: Pnuemonia and acute hypoxic respiratory failure Additional comments: treated for pneumoania and acute hypoxic respiratory failure before she  was discharged on 12/15/17 with oral anitbiotics. She did not require home oxygen.     12/30/2017 - 01/02/2018 Hospital Admission    Admission diagnosis: Pnuemonia  Additional comments: She was again hospitalized on 12/30/17 for pneumonia, ID work up was otherwise negative, she was discharged home with oral antibiotics.     04/01/2018 Imaging    04/01/2018 CXR IMPRESSION: Persisting airspace disease superimposed on chronic changes, suggesting pneumonia given the history. Followup PA and lateral chest X-ray is recommended in 3-4 weeks following therapy to ensure resolution and exclude underlying malignancy.  Redemonstration of mid and lower thoracic compression fractures with associated thoracic kyphosis.    04/23/2018 Echocardiogram    04/23/2018 ECHO LV EF: 45% -   50%      CURRENT THERAPY:    Daratumumab every 3 weeks   INTERVAL HISTORY:  Shannon Rush is here for a follow up and ongoing treatment. She presents to the clinic today by herself. She notes her trip to [place] went well and she did not need to use her doxycycline. She denies developing a cough. She is overall doing well and was able to gain some weight.  She notes less chronic back pain, but the constant tingling and intermittent shooting pain and mild numbness in her feet. She denies any new pain. She notes she has been able to remain active and willing to do more weight bearing exercise.  She is fine with stopping her oral dexa and understands why she needs to keep her pre-med steroids.     REVIEW OF SYSTEMS:   Constitutional: Denies fevers, chills or abnormal weight loss Eyes: Denies blurriness of vision Ears, nose, mouth, throat, and face: Denies mucositis or sore throat Respiratory: Denies cough, dyspnea or wheezes Cardiovascular: Denies palpitation, chest discomfort or lower extremity swelling Gastrointestinal:  Denies nausea, heartburn or change in bowel habits MSK: (+) Improved chronic back pain  Skin: Denies abnormal skin rashes Lymphatics: Denies new lymphadenopathy or easy bruising Neurological: (+)  Behavioral/Psych: Mood is stable, no new changes (+) constant tingling and intermittent shooting pain and mild numbness in her feet.  All other systems were reviewed with the patient and are negative.  MEDICAL HISTORY:  Past Medical History:  Diagnosis Date  . CAD (coronary artery disease)    a. inf STEMI (in [place] >>> [facility]) >>> LHC (8/15- [place]):  pRCA 60%, mid to dist RCA 50% >>> med rx  . Cardiomyopathy ([place])   . Chronic combined systolic and diastolic CHF (congestive heart failure) ([place])   . Cord compression ([place]) 07/13/12   MRI- diffuse myeloma involvement of T-L spine  . History of radiation therapy 07/13/12-07/27/12   spinal cord compression T3-T10,left scapula  . Hx  of echocardiogram    Echo (03/02/14-done at [facility]):  Normal LVEF without significant regional wall motion abnormalities. Mild   . Multiple myeloma ([place]) 07/01/2012  . MVP (mitral valve prolapse)    a. trivial by echo 06/2017.  . OSTEOPOROSIS 06/11/2010   Multiple compression fractures; and spontaneous fracture of sternum Qualifier: Diagnosis of  By: [name], [name]     . Thoracic kyphosis 07/13/12   per MRI scan  . Unspecified deficiency anemia     SURGICAL HISTORY: Past Surgical History:  Procedure Laterality Date  . APPENDECTOMY    . CESAREAN SECTION     x2   . COLONOSCOPY  2007   neg with Dr. [name]  . ELBOW SURGERY    .  HIP SURGERY  2009   left  . TUBAL LIGATION      I have reviewed the social history and family history with the patient and they are unchanged from previous note.  ALLERGIES:  is allergic to zithromax [azithromycin]; zosyn [piperacillin sod-tazobactam so]; and quinolones.  MEDICATIONS:  Current Outpatient Medications  Medication Sig Dispense Refill  . acetaminophen (TYLENOL) 500 MG tablet Take 500 mg by mouth every 6 (six) hours as needed for moderate pain or fever.    Marland Kitchen acyclovir (ZOVIRAX) 400 MG tablet Take 1 tablet (400 mg total) by mouth 2 (two) times daily. 60 tablet 11  . aspirin EC 81 MG tablet Take 1 tablet (81 mg total) by mouth daily. 90 tablet 3  . atorvastatin (LIPITOR) 20 MG tablet Take 1 tablet (20 mg total) by mouth daily. 90 tablet 3  . B Complex-C (SUPER B COMPLEX PO) Take 1 tablet by mouth daily.    . Calcium Carbonate-Vit D-Min (CALCIUM 600+D PLUS MINERALS) 600-400 MG-UNIT TABS Take by mouth.    . dexamethasone (DECADRON) 4 MG tablet Take 5 tab the day before chemo, every 2 weeks 30 tablet 1  . doxycycline (VIBRA-TABS) 100 MG tablet Take 1 tablet (100 mg total) by mouth 2 (two) times daily. 14 tablet 0  . metoprolol succinate (TOPROL-XL) 25 MG 24 hr tablet Take 1 tablet (25 mg total) by mouth daily. 90  tablet 2  . polyethylene glycol (MIRALAX) packet Take 17 g by mouth daily as needed. 14 each 0   No current facility-administered medications for this visit.     PHYSICAL EXAMINATION: ECOG PERFORMANCE STATUS: 1 - Symptomatic but completely ambulatory  Vitals:   06/18/18 1115  BP: (!) 147/79  Pulse: 86  Resp: 18  Temp: 97.8 F (36.6 C)  SpO2: 100%   Filed Weights   06/18/18 1115  Weight: 121 lb (54.9 kg)    GENERAL:alert, no distress and comfortable SKIN: skin color, texture, turgor are normal, no rashes or significant lesions EYES: normal, Conjunctiva are pink and non-injected, sclera clear OROPHARYNX:no exudate, no erythema and lips, buccal mucosa, and tongue normal  NECK: supple, thyroid normal size, non-tender, without nodularity LYMPH:  no palpable lymphadenopathy in the cervical, axillary or inguinal LUNGS: clear to auscultation and percussion with normal breathing effort HEART: regular rate & rhythm and no murmurs and no lower extremity edema ABDOMEN:abdomen soft, non-tender and normal bowel sounds Musculoskeletal:no cyanosis of digits and no clubbing  NEURO: alert & oriented x 3 with fluent speech, no focal motor/sensory deficits  LABORATORY DATA:  I have reviewed the data as listed CBC Latest Ref Rng & Units 06/18/2018 05/28/2018 05/07/2018  WBC 4.0 - 10.5 K/uL 7.9 8.7 8.4  Hemoglobin 12.0 - 15.0 g/dL 11.1(L) 10.7(L) 10.3(L)  Hematocrit 36.0 - 46.0 % 33.1(L) 32.5(L) 31.6(L)  Platelets 150 - 400 K/uL 180 252 201     CMP Latest Ref Rng & Units 06/18/2018 05/28/2018 05/07/2018  Glucose 70 - 99 mg/dL 125(H) 99 143(H)  BUN 8 - 23 mg/dL 26(H) 20 21  Creatinine 0.44 - 1.00 mg/dL 0.85 0.80 0.79  Sodium 135 - 145 mmol/L 140 144 142  Potassium 3.5 - 5.1 mmol/L 3.8 3.6 3.8  Chloride 98 - 111 mmol/L 106 110 108  CO2 22 - 32 mmol/L 20(L) 23 21(L)  Calcium 8.9 - 10.3 mg/dL 9.5 9.0 9.6  Total Protein 6.5 - 8.1 g/dL 6.6 6.3(L) 6.4(L)  Total Bilirubin 0.3 - 1.2 mg/dL 0.7  0.5 0.5  Alkaline Phos 38 -  126 U/L 73 80 85  AST 15 - 41 U/L 25 19 23   ALT 0 - 44 U/L 19 13 22       RADIOGRAPHIC STUDIES: I have personally reviewed the radiological images as listed and agreed with the findings in the report. No results found.   ASSESSMENT & PLAN:  Shannon Rush is a 77 y.o. female with   1. IgG kappa Multiple myeloma s/p Auto SCT, intermediate risk, relapsed in 01/2016 -She was initially diagnosed in 05/2012. She is s/p induction chemo, radiation therapy, Bone Marrow transplant before progressing on maintenance Revlimid in 01/2016. She is currently on Daratumumab infusion every 3 weeks with Dexa the day before.  -Labs reviewed, Hg at 11.1, WBC and PLT normal. MM panel, CMP are still pending. Her M-Protein has been stable at low level around 0.2 lately.  -I discussed long-term use of steroids can weaken her bone. Given her multiple history of fracture, very high risk of fracture, I will stop her oral dexa and continue pre-med steroids only to prevent infusion reaction to Dara. She agrees.   -I reviewed her recent bone survey result -She is doing well clinically. Physical exam was unremarkable, Lab reviewed, adequate for treatment, will proceed Daratumumab today and continue every 3 weeks. Her disease is being better controlled now.  -She has had her flu shot for this year.  -F/u in 3 weeks   2. Recurrent pneumonia  -Hospitalized and treated antibiotics multiple times in 5-12/2017 and recurred again in 03/2018.  -She has recovered well  -Her ID workup was negative -She will continue to use a spirometer at home and practice deep breathing.  -Lungs clear and with adequate sound on physical exam today (06/18/18)   3. Pancytopenia  -secondary to Dara, slightly worse after we increased Dara to every 2 weeks  -Continue monitoring closely. -Improved: WBC and PLT normal, Hg increased to 11.1 today (06/18/18)  4. CHF, CAD, inferior STEMI in 02/2014 -On lisinopril,  metoprolol and lipitor, managed by Dr. Meda Coffee. He will monitor her with echo every 3 months while on treatment.  -She knows to watch for leg swelling and SOB and chest pain as these are symptoms of her heart condition.  5. Osteoporosis with multiple fractures  -Her last DEXA in 2015 showed worsened osteoporosis.  -She completed Zometa treatment 03/2015-10/23/16.   -She declined repeating DEXA  -Last fracture was from a fall in 01/2018 -I will stop her oral dexa as long term use of steroids can weaken her bone  6. Peripheral neuropathy in feet, G1-2 -Secondary to previous chemotherapy  -She has declined Gabapentin -She still has significant constant tingling in feet with occasional shooting pain.   7. Goal of care discussion  -she is full code now    Plan -She is clinically doing well, labs reviewed, adequate for Daratumumab treatment and continue every 3 weeks -Will stop oral dexa the day before Dara due to her high risk of fracture   -lab, f/u, and Dara on 1/2   No problem-specific Assessment & Plan notes found for this encounter.   No orders of the defined types were placed in this encounter.  All questions were answered. The patient knows to call the clinic with any problems, questions or concerns. No barriers to learning was detected. I spent 20 minutes counseling the patient face to face. The total time spent in the appointment was 25 minutes and more than 50% was on counseling and review of test results     Krista Blue  Burr Medico, MD 06/18/2018   I, Joslyn Devon, am acting as scribe for [name], MD.   I have reviewed the above documentation for accuracy and completeness, and I agree with the above.

## 2018-06-18 ENCOUNTER — Inpatient Hospital Stay: Payer: Medicare Other | Attending: Hematology

## 2018-06-18 ENCOUNTER — Inpatient Hospital Stay: Payer: Medicare Other | Admitting: Hematology

## 2018-06-18 ENCOUNTER — Encounter: Payer: Self-pay | Admitting: Hematology

## 2018-06-18 ENCOUNTER — Inpatient Hospital Stay: Payer: Medicare Other

## 2018-06-18 VITALS — BP 109/58 | HR 80 | Temp 97.8°F | Resp 16

## 2018-06-18 VITALS — BP 147/79 | HR 86 | Temp 97.8°F | Resp 18 | Ht 67.0 in | Wt 121.0 lb

## 2018-06-18 DIAGNOSIS — C9002 Multiple myeloma in relapse: Secondary | ICD-10-CM | POA: Insufficient documentation

## 2018-06-18 DIAGNOSIS — I5042 Chronic combined systolic (congestive) and diastolic (congestive) heart failure: Secondary | ICD-10-CM | POA: Insufficient documentation

## 2018-06-18 DIAGNOSIS — Z79899 Other long term (current) drug therapy: Secondary | ICD-10-CM

## 2018-06-18 DIAGNOSIS — Z7982 Long term (current) use of aspirin: Secondary | ICD-10-CM | POA: Insufficient documentation

## 2018-06-18 DIAGNOSIS — I252 Old myocardial infarction: Secondary | ICD-10-CM | POA: Insufficient documentation

## 2018-06-18 DIAGNOSIS — I2510 Atherosclerotic heart disease of native coronary artery without angina pectoris: Secondary | ICD-10-CM | POA: Diagnosis not present

## 2018-06-18 DIAGNOSIS — C9000 Multiple myeloma not having achieved remission: Secondary | ICD-10-CM

## 2018-06-18 DIAGNOSIS — G629 Polyneuropathy, unspecified: Secondary | ICD-10-CM | POA: Insufficient documentation

## 2018-06-18 DIAGNOSIS — Z923 Personal history of irradiation: Secondary | ICD-10-CM | POA: Insufficient documentation

## 2018-06-18 DIAGNOSIS — Z87311 Personal history of (healed) other pathological fracture: Secondary | ICD-10-CM | POA: Diagnosis not present

## 2018-06-18 DIAGNOSIS — Z9221 Personal history of antineoplastic chemotherapy: Secondary | ICD-10-CM | POA: Insufficient documentation

## 2018-06-18 DIAGNOSIS — G620 Drug-induced polyneuropathy: Secondary | ICD-10-CM

## 2018-06-18 DIAGNOSIS — M549 Dorsalgia, unspecified: Secondary | ICD-10-CM

## 2018-06-18 DIAGNOSIS — D61818 Other pancytopenia: Secondary | ICD-10-CM | POA: Insufficient documentation

## 2018-06-18 DIAGNOSIS — G8929 Other chronic pain: Secondary | ICD-10-CM | POA: Diagnosis not present

## 2018-06-18 DIAGNOSIS — M810 Age-related osteoporosis without current pathological fracture: Secondary | ICD-10-CM | POA: Insufficient documentation

## 2018-06-18 DIAGNOSIS — T451X5S Adverse effect of antineoplastic and immunosuppressive drugs, sequela: Secondary | ICD-10-CM | POA: Insufficient documentation

## 2018-06-18 DIAGNOSIS — C9001 Multiple myeloma in remission: Secondary | ICD-10-CM

## 2018-06-18 DIAGNOSIS — D6481 Anemia due to antineoplastic chemotherapy: Secondary | ICD-10-CM

## 2018-06-18 DIAGNOSIS — Z5112 Encounter for antineoplastic immunotherapy: Secondary | ICD-10-CM | POA: Insufficient documentation

## 2018-06-18 DIAGNOSIS — M818 Other osteoporosis without current pathological fracture: Secondary | ICD-10-CM

## 2018-06-18 DIAGNOSIS — Z8701 Personal history of pneumonia (recurrent): Secondary | ICD-10-CM | POA: Diagnosis not present

## 2018-06-18 DIAGNOSIS — T451X5A Adverse effect of antineoplastic and immunosuppressive drugs, initial encounter: Secondary | ICD-10-CM

## 2018-06-18 LAB — COMPREHENSIVE METABOLIC PANEL
ALT: 19 U/L (ref 0–44)
ANION GAP: 14 (ref 5–15)
AST: 25 U/L (ref 15–41)
Albumin: 4.1 g/dL (ref 3.5–5.0)
Alkaline Phosphatase: 73 U/L (ref 38–126)
BUN: 26 mg/dL — ABNORMAL HIGH (ref 8–23)
CO2: 20 mmol/L — ABNORMAL LOW (ref 22–32)
Calcium: 9.5 mg/dL (ref 8.9–10.3)
Chloride: 106 mmol/L (ref 98–111)
Creatinine, Ser: 0.85 mg/dL (ref 0.44–1.00)
GFR calc Af Amer: >60 mL/min (ref >60)
GFR calc non Af Amer: >60 mL/min (ref >60)
Glucose, Bld: 125 mg/dL — ABNORMAL HIGH (ref 70–99)
POTASSIUM: 3.8 mmol/L (ref 3.5–5.1)
Sodium: 140 mmol/L (ref 135–145)
Total Bilirubin: 0.7 mg/dL (ref 0.3–1.2)
Total Protein: 6.6 g/dL (ref 6.5–8.1)

## 2018-06-18 LAB — CBC WITH DIFFERENTIAL (CANCER CENTER ONLY)
Abs Immature Granulocytes: 0.09 10*3/uL — ABNORMAL HIGH (ref 0.00–0.07)
BASOS PCT: 0 %
Basophils Absolute: 0 10*3/uL (ref 0.0–0.1)
EOS PCT: 0 %
Eosinophils Absolute: 0 10*3/uL (ref 0.0–0.5)
HEMATOCRIT: 33.1 % — AB (ref 36.0–46.0)
Hemoglobin: 11.1 g/dL — ABNORMAL LOW (ref 12.0–15.0)
Immature Granulocytes: 1 %
LYMPHS ABS: 0.4 10*3/uL — AB (ref 0.7–4.0)
Lymphocytes Relative: 5 %
MCH: 34.6 pg — AB (ref 26.0–34.0)
MCHC: 33.5 g/dL (ref 30.0–36.0)
MCV: 103.1 fL — ABNORMAL HIGH (ref 80.0–100.0)
MONO ABS: 0.6 10*3/uL (ref 0.1–1.0)
Monocytes Relative: 8 %
NEUTROS PCT: 86 %
Neutro Abs: 6.8 10*3/uL (ref 1.7–7.7)
PLATELETS: 180 10*3/uL (ref 150–400)
RBC: 3.21 MIL/uL — ABNORMAL LOW (ref 3.87–5.11)
RDW: 16.4 % — AB (ref 11.5–15.5)
WBC Count: 7.9 10*3/uL (ref 4.0–10.5)
nRBC: 0 % (ref 0.0–0.2)

## 2018-06-18 MED ORDER — SODIUM CHLORIDE 0.9 % IV SOLN
Freq: Once | INTRAVENOUS | Status: AC
  Administered 2018-06-18: 12:00:00 via INTRAVENOUS
  Filled 2018-06-18: qty 250

## 2018-06-18 MED ORDER — ACETAMINOPHEN 325 MG PO TABS
ORAL_TABLET | ORAL | Status: AC
  Filled 2018-06-18: qty 2

## 2018-06-18 MED ORDER — METHYLPREDNISOLONE SODIUM SUCC 125 MG IJ SOLR
INTRAMUSCULAR | Status: AC
  Filled 2018-06-18: qty 2

## 2018-06-18 MED ORDER — MONTELUKAST SODIUM 10 MG PO TABS
ORAL_TABLET | ORAL | Status: AC
  Filled 2018-06-18: qty 1

## 2018-06-18 MED ORDER — METHYLPREDNISOLONE SODIUM SUCC 125 MG IJ SOLR
125.0000 mg | Freq: Once | INTRAMUSCULAR | Status: AC
  Administered 2018-06-18: 125 mg via INTRAVENOUS

## 2018-06-18 MED ORDER — DIPHENHYDRAMINE HCL 25 MG PO CAPS
50.0000 mg | ORAL_CAPSULE | Freq: Once | ORAL | Status: AC
  Administered 2018-06-18: 50 mg via ORAL

## 2018-06-18 MED ORDER — SODIUM CHLORIDE 0.9 % IV SOLN
16.0000 mg/kg | Freq: Once | INTRAVENOUS | Status: AC
  Administered 2018-06-18: 900 mg via INTRAVENOUS
  Filled 2018-06-18: qty 40

## 2018-06-18 MED ORDER — ACETAMINOPHEN 325 MG PO TABS
650.0000 mg | ORAL_TABLET | Freq: Once | ORAL | Status: AC
  Administered 2018-06-18: 650 mg via ORAL

## 2018-06-18 MED ORDER — MONTELUKAST SODIUM 10 MG PO TABS
10.0000 mg | ORAL_TABLET | Freq: Once | ORAL | Status: AC
  Administered 2018-06-18: 10 mg via ORAL

## 2018-06-18 MED ORDER — DIPHENHYDRAMINE HCL 25 MG PO CAPS
ORAL_CAPSULE | ORAL | Status: AC
  Filled 2018-06-18: qty 2

## 2018-06-18 NOTE — Patient Instructions (Signed)
[facility] Discharge Instructions for Patients Receiving Chemotherapy  Today you received the following chemotherapy agents: Darzalex  To help prevent nausea and vomiting after your treatment, we encourage you to take your nausea medication as directed.    If you develop nausea and vomiting that is not controlled by your nausea medication, call the clinic.   BELOW ARE SYMPTOMS THAT SHOULD BE REPORTED IMMEDIATELY:  *FEVER GREATER THAN 100.5 F  *CHILLS WITH OR WITHOUT FEVER  NAUSEA AND VOMITING THAT IS NOT CONTROLLED WITH YOUR NAUSEA MEDICATION  *UNUSUAL SHORTNESS OF BREATH  *UNUSUAL BRUISING OR BLEEDING  TENDERNESS IN MOUTH AND THROAT WITH OR WITHOUT PRESENCE OF ULCERS  *URINARY PROBLEMS  *BOWEL PROBLEMS  UNUSUAL RASH Items with * indicate a potential emergency and should be followed up as soon as possible.  Feel free to call the clinic should you have any questions or concerns. The clinic phone number is [phone].  Please show the CHEMO ALERT CARD at check-in to the Emergency Department and triage nurse.   

## 2018-06-19 LAB — PROTEIN ELECTROPHORESIS, SERUM
A/G RATIO SPE: 2.1 — AB (ref 0.7–1.7)
ALBUMIN ELP: 4.2 g/dL (ref 2.9–4.4)
Alpha-1-Globulin: 0.2 g/dL (ref 0.0–0.4)
Alpha-2-Globulin: 0.5 g/dL (ref 0.4–1.0)
Beta Globulin: 0.8 g/dL (ref 0.7–1.3)
Gamma Globulin: 0.5 g/dL (ref 0.4–1.8)
Globulin, Total: 2 g/dL — ABNORMAL LOW (ref 2.2–3.9)
M-Spike, %: 0.2 g/dL — ABNORMAL HIGH
Total Protein ELP: 6.2 g/dL (ref 6.0–8.5)

## 2018-06-19 LAB — KAPPA/LAMBDA LIGHT CHAINS
Kappa free light chain: 12.7 mg/L (ref 3.3–19.4)
Kappa, lambda light chain ratio: 5.29 — ABNORMAL HIGH (ref 0.26–1.65)
Lambda free light chains: 2.4 mg/L — ABNORMAL LOW (ref 5.7–26.3)

## 2018-06-23 ENCOUNTER — Other Ambulatory Visit: Payer: Self-pay | Admitting: Cardiology

## 2018-06-23 DIAGNOSIS — Z79899 Other long term (current) drug therapy: Secondary | ICD-10-CM

## 2018-06-23 DIAGNOSIS — I2510 Atherosclerotic heart disease of native coronary artery without angina pectoris: Secondary | ICD-10-CM

## 2018-06-23 DIAGNOSIS — I252 Old myocardial infarction: Secondary | ICD-10-CM

## 2018-06-23 LAB — IFE, DARA-SPECIFIC, SERUM
IgA: <5 mg/dL — ABNORMAL LOW (ref 64–422)
IgG (Immunoglobin G), Serum: 636 mg/dL — ABNORMAL LOW (ref 700–1600)
IgM (Immunoglobulin M), Srm: 36 mg/dL (ref 26–217)

## 2018-06-23 MED ORDER — ATORVASTATIN CALCIUM 20 MG PO TABS: 20.0000 mg | ORAL_TABLET | Freq: Every day | ORAL | 2 refills | Status: DC

## 2018-06-23 NOTE — Telephone Encounter (Signed)
Pt's medication was sent to pt's pharmacy as requested. Confirmation received.  °

## 2018-07-03 ENCOUNTER — Encounter: Payer: Self-pay | Admitting: Hematology

## 2018-07-10 ENCOUNTER — Telehealth: Payer: Self-pay | Admitting: Hematology

## 2018-07-10 NOTE — Telephone Encounter (Signed)
R/s appt per pateint request message - left message for patient with new appt date and time

## 2018-07-14 DIAGNOSIS — N952 Postmenopausal atrophic vaginitis: Secondary | ICD-10-CM | POA: Diagnosis not present

## 2018-07-16 ENCOUNTER — Inpatient Hospital Stay: Payer: Medicare Other

## 2018-07-16 ENCOUNTER — Inpatient Hospital Stay (HOSPITAL_BASED_OUTPATIENT_CLINIC_OR_DEPARTMENT_OTHER): Payer: Medicare Other | Admitting: Nurse Practitioner

## 2018-07-16 ENCOUNTER — Other Ambulatory Visit: Payer: Self-pay

## 2018-07-16 ENCOUNTER — Inpatient Hospital Stay: Payer: Medicare Other | Attending: Hematology

## 2018-07-16 ENCOUNTER — Encounter: Payer: Self-pay | Admitting: Nurse Practitioner

## 2018-07-16 VITALS — BP 123/79 | HR 77 | Temp 97.6°F | Resp 18 | Ht 67.0 in | Wt 125.4 lb

## 2018-07-16 VITALS — HR 81 | Resp 14

## 2018-07-16 DIAGNOSIS — C9002 Multiple myeloma in relapse: Secondary | ICD-10-CM

## 2018-07-16 DIAGNOSIS — C9001 Multiple myeloma in remission: Secondary | ICD-10-CM

## 2018-07-16 DIAGNOSIS — Z9221 Personal history of antineoplastic chemotherapy: Secondary | ICD-10-CM | POA: Insufficient documentation

## 2018-07-16 DIAGNOSIS — G629 Polyneuropathy, unspecified: Secondary | ICD-10-CM | POA: Diagnosis not present

## 2018-07-16 DIAGNOSIS — R600 Localized edema: Secondary | ICD-10-CM | POA: Diagnosis not present

## 2018-07-16 DIAGNOSIS — Z5112 Encounter for antineoplastic immunotherapy: Secondary | ICD-10-CM | POA: Diagnosis not present

## 2018-07-16 DIAGNOSIS — R05 Cough: Secondary | ICD-10-CM | POA: Diagnosis not present

## 2018-07-16 DIAGNOSIS — C9000 Multiple myeloma not having achieved remission: Secondary | ICD-10-CM

## 2018-07-16 DIAGNOSIS — Z79899 Other long term (current) drug therapy: Secondary | ICD-10-CM | POA: Diagnosis not present

## 2018-07-16 DIAGNOSIS — I5042 Chronic combined systolic (congestive) and diastolic (congestive) heart failure: Secondary | ICD-10-CM | POA: Insufficient documentation

## 2018-07-16 DIAGNOSIS — M810 Age-related osteoporosis without current pathological fracture: Secondary | ICD-10-CM

## 2018-07-16 DIAGNOSIS — D61818 Other pancytopenia: Secondary | ICD-10-CM

## 2018-07-16 DIAGNOSIS — Z8781 Personal history of (healed) traumatic fracture: Secondary | ICD-10-CM | POA: Insufficient documentation

## 2018-07-16 DIAGNOSIS — I252 Old myocardial infarction: Secondary | ICD-10-CM

## 2018-07-16 DIAGNOSIS — Z8701 Personal history of pneumonia (recurrent): Secondary | ICD-10-CM | POA: Diagnosis not present

## 2018-07-16 DIAGNOSIS — Z7982 Long term (current) use of aspirin: Secondary | ICD-10-CM | POA: Insufficient documentation

## 2018-07-16 DIAGNOSIS — M818 Other osteoporosis without current pathological fracture: Secondary | ICD-10-CM | POA: Insufficient documentation

## 2018-07-16 DIAGNOSIS — I2510 Atherosclerotic heart disease of native coronary artery without angina pectoris: Secondary | ICD-10-CM | POA: Diagnosis not present

## 2018-07-16 DIAGNOSIS — I5030 Unspecified diastolic (congestive) heart failure: Secondary | ICD-10-CM

## 2018-07-16 DIAGNOSIS — Z9181 History of falling: Secondary | ICD-10-CM | POA: Insufficient documentation

## 2018-07-16 LAB — CBC WITH DIFFERENTIAL (CANCER CENTER ONLY)
Abs Immature Granulocytes: 0.01 10*3/uL (ref 0.00–0.07)
Basophils Absolute: 0 10*3/uL (ref 0.0–0.1)
Basophils Relative: 1 %
EOS ABS: 0 10*3/uL (ref 0.0–0.5)
Eosinophils Relative: 1 %
HCT: 34 % — ABNORMAL LOW (ref 36.0–46.0)
Hemoglobin: 11.2 g/dL — ABNORMAL LOW (ref 12.0–15.0)
Immature Granulocytes: 0 %
Lymphocytes Relative: 14 %
Lymphs Abs: 0.5 10*3/uL — ABNORMAL LOW (ref 0.7–4.0)
MCH: 34.1 pg — ABNORMAL HIGH (ref 26.0–34.0)
MCHC: 32.9 g/dL (ref 30.0–36.0)
MCV: 103.7 fL — ABNORMAL HIGH (ref 80.0–100.0)
Monocytes Absolute: 0.4 10*3/uL (ref 0.1–1.0)
Monocytes Relative: 10 %
Neutro Abs: 2.5 10*3/uL (ref 1.7–7.7)
Neutrophils Relative %: 74 %
Platelet Count: 154 10*3/uL (ref 150–400)
RBC: 3.28 MIL/uL — ABNORMAL LOW (ref 3.87–5.11)
RDW: 15.7 % — ABNORMAL HIGH (ref 11.5–15.5)
WBC Count: 3.4 10*3/uL — ABNORMAL LOW (ref 4.0–10.5)
nRBC: 0 % (ref 0.0–0.2)

## 2018-07-16 LAB — COMPREHENSIVE METABOLIC PANEL
ALT: 17 U/L (ref 0–44)
AST: 22 U/L (ref 15–41)
Albumin: 3.8 g/dL (ref 3.5–5.0)
Alkaline Phosphatase: 62 U/L (ref 38–126)
Anion gap: 11 (ref 5–15)
BUN: 17 mg/dL (ref 8–23)
CO2: 23 mmol/L (ref 22–32)
Calcium: 9.5 mg/dL (ref 8.9–10.3)
Chloride: 106 mmol/L (ref 98–111)
Creatinine, Ser: 0.77 mg/dL (ref 0.44–1.00)
GFR calc Af Amer: >60 mL/min (ref >60)
GFR calc non Af Amer: >60 mL/min (ref >60)
Glucose, Bld: 89 mg/dL (ref 70–99)
POTASSIUM: 4 mmol/L (ref 3.5–5.1)
Sodium: 140 mmol/L (ref 135–145)
Total Bilirubin: 0.7 mg/dL (ref 0.3–1.2)
Total Protein: 6.2 g/dL — ABNORMAL LOW (ref 6.5–8.1)

## 2018-07-16 MED ORDER — DIPHENHYDRAMINE HCL 25 MG PO CAPS
ORAL_CAPSULE | ORAL | Status: AC
  Filled 2018-07-16: qty 2

## 2018-07-16 MED ORDER — MONTELUKAST SODIUM 10 MG PO TABS
10.0000 mg | ORAL_TABLET | Freq: Once | ORAL | Status: AC
  Administered 2018-07-16: 10 mg via ORAL

## 2018-07-16 MED ORDER — SODIUM CHLORIDE 0.9 % IV SOLN
16.0000 mg/kg | Freq: Once | INTRAVENOUS | Status: AC
  Administered 2018-07-16: 900 mg via INTRAVENOUS
  Filled 2018-07-16: qty 40

## 2018-07-16 MED ORDER — DEXAMETHASONE 4 MG PO TABS: ORAL_TABLET | ORAL | 1 refills | Status: DC

## 2018-07-16 MED ORDER — METHYLPREDNISOLONE SODIUM SUCC 125 MG IJ SOLR
INTRAMUSCULAR | Status: AC
  Filled 2018-07-16: qty 2

## 2018-07-16 MED ORDER — MONTELUKAST SODIUM 10 MG PO TABS
ORAL_TABLET | ORAL | Status: AC
  Filled 2018-07-16: qty 1

## 2018-07-16 MED ORDER — SODIUM CHLORIDE 0.9 % IV SOLN
Freq: Once | INTRAVENOUS | Status: DC
  Filled 2018-07-16: qty 250

## 2018-07-16 MED ORDER — ACETAMINOPHEN 325 MG PO TABS
650.0000 mg | ORAL_TABLET | Freq: Once | ORAL | Status: AC
  Administered 2018-07-16: 650 mg via ORAL

## 2018-07-16 MED ORDER — METHYLPREDNISOLONE SODIUM SUCC 125 MG IJ SOLR
125.0000 mg | Freq: Once | INTRAMUSCULAR | Status: AC
  Administered 2018-07-16: 125 mg via INTRAVENOUS

## 2018-07-16 MED ORDER — DIPHENHYDRAMINE HCL 25 MG PO CAPS
50.0000 mg | ORAL_CAPSULE | Freq: Once | ORAL | Status: AC
  Administered 2018-07-16: 50 mg via ORAL

## 2018-07-16 MED ORDER — ACETAMINOPHEN 325 MG PO TABS
ORAL_TABLET | ORAL | Status: AC
  Filled 2018-07-16: qty 2

## 2018-07-16 NOTE — Progress Notes (Signed)
  [facility] OFFICE PROGRESS NOTE   Diagnosis: Multiple myeloma  INTERVAL HISTORY:   [name] returns as scheduled.  She completed another cycle of daratumumab 06/18/2018.  He overall is feeling well.  She gained weight over the holidays.  No nausea or vomiting.  No mouth sores.  No diarrhea.  No rash.  No signs of allergic reaction.  She has a periodic cough.  No shortness of breath.  No fever or chills.  Objective:  Vital signs in last 24 hours:  Blood pressure 123/79, pulse 77, temperature 97.6 F (36.4 C), temperature source Oral, resp. rate 18, height _0  (1.702 m), weight 125 lb 6.4 oz (56.9 kg), SpO2 100 %.    HEENT: No thrush or ulcers. Resp: Lungs clear bilaterally. Cardio: Regular rate and rhythm. GI: Abdomen soft and nontender.  No hepatomegaly. Vascular: Very minimal edema at the lower legs bilaterally. Neuro: Alert and oriented. Skin: No rash.   Lab Results:  Lab Results  Component Value Date   WBC 3.4 (L) 07/16/2018   HGB 11.2 (L) 07/16/2018   HCT 34.0 (L) 07/16/2018   MCV 103.7 (H) 07/16/2018   PLT 154 07/16/2018   NEUTROABS 2.5 07/16/2018    Imaging:  No results found.  Medications: I have reviewed the patient's current medications.  Assessment/Plan: 1. IgG kappa Multiple myeloma s/p Auto SCT, intermediate risk, relapsed in 01/2016; currently on active treatment with daratumumab. 2. History of recurrent pneumonia 3. Pancytopenia secondary to daratumumab 4. CHF/CAD/inferior STEMI August 2015 5. Osteoporosis with multiple fractures 6. Peripheral neuropathy  Disposition: Ms. Stanke appears stable.  Plan to continue every 3-week daratumumab.  We reviewed the CBC from today.  Counts are adequate for treatment.  She will return for lab, follow-up and the next cycle of daratumumab in 3 weeks.  She will contact the office in the interim with any problems.    Ned Card ANP/GNP-BC   07/16/2018  12:52 PM

## 2018-07-16 NOTE — Patient Instructions (Signed)
[facility] Discharge Instructions for Patients Receiving Chemotherapy  Today you received the following chemotherapy agents: Darzalex  To help prevent nausea and vomiting after your treatment, we encourage you to take your nausea medication as directed.    If you develop nausea and vomiting that is not controlled by your nausea medication, call the clinic.   BELOW ARE SYMPTOMS THAT SHOULD BE REPORTED IMMEDIATELY:  *FEVER GREATER THAN 100.5 F  *CHILLS WITH OR WITHOUT FEVER  NAUSEA AND VOMITING THAT IS NOT CONTROLLED WITH YOUR NAUSEA MEDICATION  *UNUSUAL SHORTNESS OF BREATH  *UNUSUAL BRUISING OR BLEEDING  TENDERNESS IN MOUTH AND THROAT WITH OR WITHOUT PRESENCE OF ULCERS  *URINARY PROBLEMS  *BOWEL PROBLEMS  UNUSUAL RASH Items with * indicate a potential emergency and should be followed up as soon as possible.  Feel free to call the clinic should you have any questions or concerns. The clinic phone number is [phone].  Please show the CHEMO ALERT CARD at check-in to the Emergency Department and triage nurse.   

## 2018-07-17 LAB — KAPPA/LAMBDA LIGHT CHAINS
Kappa free light chain: 16.7 mg/L (ref 3.3–19.4)
Kappa, lambda light chain ratio: 6.96 — ABNORMAL HIGH (ref 0.26–1.65)
Lambda free light chains: 2.4 mg/L — ABNORMAL LOW (ref 5.7–26.3)

## 2018-07-17 LAB — PROTEIN ELECTROPHORESIS, SERUM
A/G Ratio: 2.3 — ABNORMAL HIGH (ref 0.7–1.7)
Albumin ELP: 4.2 g/dL (ref 2.9–4.4)
Alpha-1-Globulin: 0.2 g/dL (ref 0.0–0.4)
Alpha-2-Globulin: 0.6 g/dL (ref 0.4–1.0)
Beta Globulin: 0.7 g/dL (ref 0.7–1.3)
Gamma Globulin: 0.4 g/dL (ref 0.4–1.8)
Globulin, Total: 1.8 g/dL — ABNORMAL LOW (ref 2.2–3.9)
M-Spike, %: 0.1 g/dL — ABNORMAL HIGH
Total Protein ELP: 6 g/dL (ref 6.0–8.5)

## 2018-07-20 NOTE — Telephone Encounter (Signed)
Scheduled patient 2/19 for OV with Melina Copa, PA.  She was grateful for call and agrees with treatment plan.

## 2018-07-21 LAB — IFE, DARA-SPECIFIC, SERUM
IgA: <5 mg/dL — ABNORMAL LOW (ref 64–422)
IgG (Immunoglobin G), Serum: 559 mg/dL — ABNORMAL LOW (ref 700–1600)
IgM (Immunoglobulin M), Srm: 32 mg/dL (ref 26–217)

## 2018-07-24 DIAGNOSIS — Z1231 Encounter for screening mammogram for malignant neoplasm of breast: Secondary | ICD-10-CM | POA: Diagnosis not present

## 2018-07-29 NOTE — Progress Notes (Signed)
[place]   [phone] [phone]) [phone]   Clinic Follow up Note   Patient Care Team: [name], MD as PCP - General (Family Medicine) [name], MD as PCP - Cardiology (Cardiology) Stefanie Libel, MD (Family Medicine) [name], MD as Attending Physician (Obstetrics and Gynecology) [name], MD as Consulting Physician (Internal Medicine) [name], MD as Consulting Physician (Hematology) [name], MD as Consulting Physician (Ophthalmology) [name], MD as Consulting Physician (Cardiology) 08/06/2018  CHIEF COMPLAINT: F/u on MM   SUMMARY OF ONCOLOGIC HISTORY: Oncology History   Multiple myeloma [facility])   Staging form: Multiple Myeloma, AJCC 6th Edition   - Clinical: Stage IIIA - Signed by [name], MD on 09/04/2013      Multiple myeloma ([place])   05/26/2012 Initial Diagnosis    Presenting IgG was 4,040 mg/dL on 06/05/2012 (IgA 35; IgM 34); kappa free light chain 34.4 mg/dL, lambda 0.00, kappa:lambda ratio of 34.75; SPEP with M-spike of 2.60.    06/30/2012 Imaging    Numerous lytic lesions throughout the calvarium.  Lytic lesion within the left lateral scapula.  Findings most compatible with metastases or myeloma. Multiple mid and lower thoracic compression fractures.  Slight compression through the endplates at L3.    10/93/2355 Bone Marrow Biopsy    Bone marrow biopsy showed 49% plasma cell. Normal classical cytogenetics; however, myeloma FISH panel showed 13q- (intermediate risk)        07/14/2012 - 07/27/2012 Radiation Therapy    Palliative radiation 20 Gy over 10 fractions between to thoracic spine cord compression and symptomatic left scapula lytic lesion.    08/10/2012 - 11/2012 Chemotherapy    Started SQ Velcade once weekly, 3 weeks on, 1 weeks off; daily Revlimid d1-21, 7 days off; and Dexamethasone 62m PO weekly.     02/12/2013 Bone Marrow Transplant    [facility]     06/14/2013 Tumor Marker    IgG  1830,  Kappa:lamba ratio, 1.69 (baseline was 34.75 or   M-spike 0.28 (baseline of 2.6 or  89.3% of baseline).      06/25/2013 -  Chemotherapy    Started zometa 3.5 mg monthly     09/01/2013 - 01/2014 Chemotherapy    Maintenance therapy with revlimid 558mdaily for 14 days and then off for 7 (decreased from 21 days on and 7 days off based on neutropenia). Stopped due to disease progression 01/2017    09/13/2013 - 09/17/2014 Hospital Admission    Hospital admission for pneumonia    12/20/2013 Treatment Plan Change    Maintenance therapy decreased to 2.5 mg daily for 21 days and then off for 7 days based on low counts.     01/25/2014 Tumor Marker    IgG 1360 M spike 0.5    03/01/2014 - 03/05/2014 Hospital Ad[facility] received thromb[facility] therapy, cath showed 60% prox RCA, mid-distal RCA 50%, Echo normal, no stents.    04/11/2014 - 08/07/2014 Chemotherapy    Continue Revlimid at 2.5 mg 3 weeks on 1 week off    08/08/2014 - 08/13/2014 Hospital Admission    Hospital admission for pneumonia. Her Revlimid was held.    08/29/2014 - 09/14/2014 Chemotherapy    Maintenance Revlimid restarted    09/14/2014 - 09/17/2014 Hospital Admission    [place].  11/13/2014 - 12/25/2015 Chemotherapy    Maintenance Revlimid restarted, changed to 2.5m daily, 2 weks on, 1 week off from 12/15/2014, stopped due to disease progression     01/24/2016 Progression    Patient is M protein has gradually increased to 1.0g, repeated a bone marrow biopsy showed plasma cell 10-30%    02/27/2016 - 03/07/2016 Chemotherapy    Pomalidomide 4 mg daily on day 1-21, dexamethasone 40 mg on day 1, 8 and 15, every 28 days, started on 02/27/2016, daratumumab weekly started on 03/07/2016, held after first dose, due to hospitalization and a severe pancytopenia,  pomalidomide was stopped afterwards     03/10/2016 - 03/29/2016 Hospital Admission    Pt was admitted for sepsis from pneumonia, and severe pancytopenia. She required ICU stay for a few days due to hypotension, developed b/l pleural effusion required diuretics and b/l thoracentesis. She also required blood and plt transfusion, and prolonged neupogen injection for severe neutropenia. She was discharged home after 3 weeks hospital stay     04/26/2016 -  Chemotherapy    Daratumumab restarted on 04/26/2016, Velcade 1.338mm2 and dexa 2033meekly start on 11/3, velcade held since 06/28/2016 due to CHF, dexa stopped on12/11/2017 due to high risk of fracture     08/26/2016 Echocardiogram    - Left ventricle: LVEF is approximately 35% with diffuse diffuse   hypokinesis with severe hypokinesis/ akinesis of the   inferior/inferoseptal walls. In comparison to echo images from   December 2017, there does not appear to be significant change The   cavity size was normal. Wall thickness was normal. Doppler   parameters are consistent with abnormal left ventricular   relaxation (grade 1 diastolic dysfunction). - Aortic valve: AV is thickened. 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 Present in echo from December 2017 There was   trivial regurgitation. - Right ventricle: Systolic function was mildly reduced. - Right atrium: The atrium was mildly dilated. - Pericardium, extracardiac: A trivial pericardial effusion was   identified.    12/03/2016 Echocardiogram    -Left Ventricle: Systolic function was   mildly to moderately reduced. The estimated ejection fraction was   in the range of 40% to 45%. Diffuse hypokinesis. Doppler   parameters are consistent with abnormal left ventricular   relaxation (grade 1 diastolic dysfunction). Doppler parameters   are consistent with indeterminate ventricular filling pressure. - Left atrium: The atrium was  severely dilated. - Tricuspid valve: There was trivial regurgitation. - Pulmonary arteries: Systolic pressure was within the normal   range. PA peak pressure: 33 mm Hg (S). - Global longitudinal strain -10.5% (abnormal)    03/12/2017 Echocardiogram    ECHO 03/12/17 Study Conclusions - Left ventricle: The cavity size was normal. Wall thickness was   increased in a pattern of mild LVH. Systolic function was normal.   The estimated ejection fraction was in the range of 50% to 55%.   There is hypokinesis of the basalinferior myocardium. Doppler   parameters are consistent with abnormal left ventricular   relaxation (grade 1 diastolic dysfunction). - Aortic valve: There was trivial regurgitation. - Mitral valve: Calcified annulus. Mildly thickened leaflets .   There was trivial regurgitation. - Tricuspid valve: There was mild regurgitation. Impressions: - There has been mild improvement in EF since prior study.     12/12/2017 - 12/15/2017 Hospital Admission    Admission diagnosis: Pnuemonia and acute hypoxic respiratory failure Additional comments: treated for pneumoania and acute  hypoxic respiratory failure before she was discharged on 12/15/17 with oral anitbiotics. She did not require home oxygen.     12/30/2017 - 01/02/2018 Hospital Admission    Admission diagnosis: Pnuemonia  Additional comments: She was again hospitalized on 12/30/17 for pneumonia, ID work up was otherwise negative, she was discharged home with oral antibiotics.     04/01/2018 Imaging    04/01/2018 CXR IMPRESSION: Persisting airspace disease superimposed on chronic changes, suggesting pneumonia given the history. Followup PA and lateral chest X-ray is recommended in 3-4 weeks following therapy to ensure resolution and exclude underlying malignancy.  Redemonstration of mid and lower thoracic compression fractures with associated thoracic kyphosis.    04/23/2018 Echocardiogram    04/23/2018 ECHO LV EF: 45% -    50%     CURRENT THERAPY Daratumumab every 3 weeks. Dexamethasone a day before treatment. Dexa stopped in 1.2020 due to well controlled MM and high risk of fracture    INTERVAL HISTORY: Shannon Rush is a 78 y.o. female who is here for follow-up. Today, she is here alone. She is doing well. She denies new bone pain, fever or chills. She reports stable mild productive cough in the mornings.  Pertinent positives and negatives of review of systems are listed and detailed within the above HPI.  REVIEW OF SYSTEMS:   Constitutional: Denies fevers, chills or abnormal weight loss Eyes: Denies blurriness of vision Ears, nose, mouth, throat, and face: Denies mucositis or sore throat Respiratory: Denies dyspnea or wheezes (+) productive cough in the morning Cardiovascular: Denies palpitation, chest discomfort or lower extremity swelling Gastrointestinal:  Denies nausea, heartburn or change in bowel habits Skin: Denies abnormal skin rashes Lymphatics: Denies new lymphadenopathy or easy bruising Neurological:Denies numbness, tingling or new weaknesses Behavioral/Psych: Mood is stable, no new changes  All other systems were reviewed with the patient and are ne gative.   MEDICAL HISTORY:  Past Medical History:  Diagnosis Date  . CAD (coronary artery disease)    a. inf STEMI (in [place] >>> [facility]) >>> LHC (8/15- [place]):  pRCA 60%, mid to dist RCA 50% >>> med rx  . Cardiomyopathy ([place])   . Chronic combined systolic and diastolic CHF (congestive heart failure) ([place])   . Cord compression ([place]) 07/13/12   MRI- diffuse myeloma involvement of T-L spine  . History of radiation therapy 07/13/12-07/27/12   spinal cord compression T3-T10,left scapula  . Hx of echocardiogram    Echo (03/02/14-done at [facility]):  Normal LVEF without significant regional wall motion abnormalities. Mild   . Multiple myeloma ([place]) 07/01/2012  . MVP (mitral valve prolapse)     a. trivial by echo 06/2017.  . OSTEOPOROSIS 06/11/2010   Multiple compression fractures; and spontaneous fracture of sternum Qualifier: Diagnosis of  By: [name], [name]     . Thoracic kyphosis 07/13/12   per MRI scan  . Unspecified deficiency anemia     SURGICAL HISTORY: Past Surgical History:  Procedure Laterality Date  . APPENDECTOMY    . CESAREAN SECTION     x2   . COLONOSCOPY  2007   neg with Dr. [name]  . ELBOW SURGERY    . HIP SURGERY  2009   left  . TUBAL LIGATION      I have reviewed the social history and family history with the patient and they are unchanged from previous note.  ALLERGIES:  is allergic to zithromax [azithromycin]; zosyn [piperacillin sod-tazobactam so]; and quinolones.  MEDICATIONS:  Current Outpatient Medications  Medication Sig Dispense Refill  . acetaminophen (TYLENOL) 500 MG tablet Take 500 mg by mouth every 6 (six) hours as needed for moderate pain or fever.    Marland Kitchen acyclovir (ZOVIRAX) 400 MG tablet Take 1 tablet (400 mg total) by mouth 2 (two) times daily. 60 tablet 11  . aspirin EC 81 MG tablet Take 1 tablet (81 mg total) by mouth daily. 90 tablet 3  . atorvastatin (LIPITOR) 20 MG tablet Take 1 tablet (20 mg total) by mouth daily. 90 tablet 2  . B Complex-C (SUPER B COMPLEX PO) Take 1 tablet by mouth daily.    . Calcium Carbonate-Vit D-Min (CALCIUM 600+D PLUS MINERALS) 600-400 MG-UNIT TABS Take by mouth.    . dexamethasone (DECADRON) 4 MG tablet Take 5 tab the day before chemo, every 2 weeks 30 tablet 1  . doxycycline (VIBRA-TABS) 100 MG tablet Take 1 tablet (100 mg total) by mouth 2 (two) times daily. 14 tablet 0  . metoprolol succinate (TOPROL-XL) 25 MG 24 hr tablet Take 1 tablet (25 mg total) by mouth daily. 90 tablet 2  . polyethylene glycol (MIRALAX) packet Take 17 g by mouth daily as needed. 14 each 0   No current facility-administered medications for this visit.    Facility-Administered Medications Ordered in Other Visits  Medication  Dose Route Frequency Provider Last Rate Last Dose  . daratumumab (DARZALEX) 900 mg in sodium chloride 0.9 % 455 mL chemo infusion  16 mg/kg (Treatment Plan Recorded) Intravenous Once [name], MD        PHYSICAL EXAMINATION: ECOG PERFORMANCE STATUS: 1 - Symptomatic but completely ambulatory  Vitals:   08/06/18 0902  BP: 130/77  Pulse: 77  Resp: 18  Temp: 97.8 F (36.6 C)  SpO2: 97%   Filed Weights   08/06/18 0902  Weight: 124 lb 11.2 oz (56.6 kg)    GENERAL:alert, no distress and comfortable SKIN: skin color, texture, turgor are normal, no rashes or significant lesions EYES: normal, Conjunctiva are pink and non-injected, sclera clear OROPHARYNX:no exudate, no erythema and lips, buccal mucosa, and tongue normal (+) cough NECK: supple, thyroid normal size, non-tender, without nodularity LYMPH:  no palpable lymphadenopathy in the cervical, axillary or inguinal LUNGS: clear to auscultation and percussion with normal breathing effort HEART: regular rate & rhythm and no murmurs and no lower extremity edema ABDOMEN:abdomen soft, non-tender and normal bowel sounds Musculoskeletal:no cyanosis of digits and no clubbing  NEURO: alert & oriented x 3 with fluent speech, no focal motor/sensory deficits  LABORATORY DATA:  I have reviewed the data as listed CBC Latest Ref Rng & Units 08/06/2018 07/16/2018 06/18/2018  WBC 4.0 - 10.5 K/uL 2.4(L) 3.4(L) 7.9  Hemoglobin 12.0 - 15.0 g/dL 12.0 11.2(L) 11.1(L)  Hematocrit 36.0 - 46.0 % 35.6(L) 34.0(L) 33.1(L)  Platelets 150 - 400 K/uL 167 154 180     CMP Latest Ref Rng & Units 08/06/2018 07/16/2018 06/18/2018  Glucose 70 - 99 mg/dL 232(H) 89 125(H)  BUN 8 - 23 mg/dL 29(H) 17 26(H)  Creatinine 0.44 - 1.00 mg/dL 1.13(H) 0.77 0.85  Sodium 135 - 145 mmol/L 140 140 140  Potassium 3.5 - 5.1 mmol/L 4.0 4.0 3.8  Chloride 98 - 111 mmol/L 107 106 106  CO2 22 - 32 mmol/L 21(L) 23 20(L)  Calcium 8.9 - 10.3 mg/dL 9.5 9.5 9.5  Total Protein 6.5 - 8.1 g/dL  6.6 6.2(L) 6.6  Total Bilirubin 0.3 - 1.2 mg/dL 0.5 0.7 0.7  Alkaline Phos 38 - 126  U/L 64 62 73  AST 15 - 41 U/L 24 22 25   ALT 0 - 44 U/L 23 17 19       RADIOGRAPHIC STUDIES: I have personally reviewed the radiological images as listed and agreed with the findings in the report. No results found.   ASSESSMENT & PLAN:  Shannon Rush is a 78 y.o. female with history of  1. IgG kappa Multiple myeloma s/p Auto SCT, intermediate risk, relapsed in 01/2016 -Diagnosed in 05/2012. Treated with induction chemo, radiation and BM transplant, but unfortunately progressed while on maintenance Revlimid 01/2016. She is currently on Daratumumab infusion every 3 weeks with Dexa the day before.  -I will stop oral steroids from now on due to her high risk of fracture, and well controlled MM  -Labs reviewed, CBC showed WBC 2.4 Hg 12.0 MCV 103.8. CMP MM panel pending. -She knows to call for any concerns. -She is clinically stable and doing well. She reports productive cough in the morning, but denies fever and chills. -will proceed daratumumab infusion today and continue every 3 weeks  2. Pancytopenia  -Secondary to [place]. -Labs reviewed, CBC showed WBC 2.4 RBC 3.43 PLT 167K.  3. CHF, CAD, inferior STEMI in 02/2014 -f/u with Dr. Meda Coffee and continue medications -She has a echocardiogram scheduled for next week follow-up with cardiology  4. Osteoporosis with multiple fractures  -Last DEXA  in 2015 showed worsening osteoporosis. She declined repeat DEXA. -She had a fall induced fracture in 01/2018. -She was on Zometa for 4 years, stopped after April 2018  5. Peripheral neuropathy in feet, G1-2 -Secondary to previous chemo. Declined Gabapentin previously. -will monitor  6. Goal of care discussion  -she is full code now    Plan  -Labs reviewed, good to proceed with daratumumab treatment.  -Lab and infusion in 3 and 7 weeks (she has a trip in 6 weeks) -f/u in 7 weeks   No problem-specific  Assessment & Plan notes found for this encounter.   No orders of the defined types were placed in this encounter.  All questions were answered. The patient knows to call the clinic with any problems, questions or concerns. No barriers to learning was detected. I spent 20 minutes counseling the patient face to face. The total time spent in the appointment was 25 minutes and more than 50% was on counseling and review of test results  I, Noor Dweik am acting as scribe for Dr. [name].  I have reviewed the above documentation for accuracy and completeness, and I agree with the above.     [name], MD 08/06/2018

## 2018-07-30 ENCOUNTER — Other Ambulatory Visit: Payer: Medicare Other

## 2018-07-30 ENCOUNTER — Ambulatory Visit: Payer: Medicare Other

## 2018-08-05 ENCOUNTER — Other Ambulatory Visit: Payer: Medicare Other

## 2018-08-05 ENCOUNTER — Other Ambulatory Visit: Payer: Self-pay | Admitting: Hematology

## 2018-08-05 ENCOUNTER — Ambulatory Visit: Payer: Medicare Other

## 2018-08-05 DIAGNOSIS — C9000 Multiple myeloma not having achieved remission: Secondary | ICD-10-CM

## 2018-08-06 ENCOUNTER — Inpatient Hospital Stay: Payer: Medicare Other

## 2018-08-06 ENCOUNTER — Encounter: Payer: Self-pay | Admitting: Family Medicine

## 2018-08-06 ENCOUNTER — Inpatient Hospital Stay (HOSPITAL_BASED_OUTPATIENT_CLINIC_OR_DEPARTMENT_OTHER): Payer: Medicare Other | Admitting: Hematology

## 2018-08-06 ENCOUNTER — Ambulatory Visit: Payer: Medicare Other

## 2018-08-06 ENCOUNTER — Encounter: Payer: Self-pay | Admitting: Hematology

## 2018-08-06 VITALS — BP 112/58 | HR 64 | Temp 98.2°F | Resp 18

## 2018-08-06 VITALS — BP 130/77 | HR 77 | Temp 97.8°F | Resp 18 | Ht 67.0 in | Wt 124.7 lb

## 2018-08-06 DIAGNOSIS — D61818 Other pancytopenia: Secondary | ICD-10-CM

## 2018-08-06 DIAGNOSIS — Z8781 Personal history of (healed) traumatic fracture: Secondary | ICD-10-CM | POA: Diagnosis not present

## 2018-08-06 DIAGNOSIS — G629 Polyneuropathy, unspecified: Secondary | ICD-10-CM

## 2018-08-06 DIAGNOSIS — M818 Other osteoporosis without current pathological fracture: Secondary | ICD-10-CM

## 2018-08-06 DIAGNOSIS — R05 Cough: Secondary | ICD-10-CM | POA: Diagnosis not present

## 2018-08-06 DIAGNOSIS — C9000 Multiple myeloma not having achieved remission: Secondary | ICD-10-CM

## 2018-08-06 DIAGNOSIS — C9002 Multiple myeloma in relapse: Secondary | ICD-10-CM

## 2018-08-06 DIAGNOSIS — Z9181 History of falling: Secondary | ICD-10-CM | POA: Diagnosis not present

## 2018-08-06 DIAGNOSIS — I252 Old myocardial infarction: Secondary | ICD-10-CM

## 2018-08-06 DIAGNOSIS — Z5112 Encounter for antineoplastic immunotherapy: Secondary | ICD-10-CM | POA: Diagnosis not present

## 2018-08-06 DIAGNOSIS — R600 Localized edema: Secondary | ICD-10-CM | POA: Diagnosis not present

## 2018-08-06 DIAGNOSIS — I2510 Atherosclerotic heart disease of native coronary artery without angina pectoris: Secondary | ICD-10-CM

## 2018-08-06 DIAGNOSIS — Z7982 Long term (current) use of aspirin: Secondary | ICD-10-CM | POA: Diagnosis not present

## 2018-08-06 DIAGNOSIS — Z9221 Personal history of antineoplastic chemotherapy: Secondary | ICD-10-CM | POA: Diagnosis not present

## 2018-08-06 DIAGNOSIS — I5042 Chronic combined systolic (congestive) and diastolic (congestive) heart failure: Secondary | ICD-10-CM | POA: Diagnosis not present

## 2018-08-06 DIAGNOSIS — Z79899 Other long term (current) drug therapy: Secondary | ICD-10-CM | POA: Diagnosis not present

## 2018-08-06 DIAGNOSIS — Z8701 Personal history of pneumonia (recurrent): Secondary | ICD-10-CM

## 2018-08-06 LAB — CBC WITH DIFFERENTIAL (CANCER CENTER ONLY)
Abs Immature Granulocytes: 0.01 10*3/uL (ref 0.00–0.07)
Basophils Absolute: 0 10*3/uL (ref 0.0–0.1)
Basophils Relative: 1 %
Eosinophils Absolute: 0.1 10*3/uL (ref 0.0–0.5)
Eosinophils Relative: 3 %
HCT: 35.6 % — ABNORMAL LOW (ref 36.0–46.0)
Hemoglobin: 12 g/dL (ref 12.0–15.0)
IMMATURE GRANULOCYTES: 0 %
LYMPHS PCT: 8 %
Lymphs Abs: 0.2 10*3/uL — ABNORMAL LOW (ref 0.7–4.0)
MCH: 35 pg — ABNORMAL HIGH (ref 26.0–34.0)
MCHC: 33.7 g/dL (ref 30.0–36.0)
MCV: 103.8 fL — ABNORMAL HIGH (ref 80.0–100.0)
Monocytes Absolute: 0 10*3/uL — ABNORMAL LOW (ref 0.1–1.0)
Monocytes Relative: 1 %
Neutro Abs: 2.1 10*3/uL (ref 1.7–7.7)
Neutrophils Relative %: 87 %
PLATELETS: 167 10*3/uL (ref 150–400)
RBC: 3.43 MIL/uL — ABNORMAL LOW (ref 3.87–5.11)
RDW: 15.1 % (ref 11.5–15.5)
WBC Count: 2.4 10*3/uL — ABNORMAL LOW (ref 4.0–10.5)
nRBC: 0 % (ref 0.0–0.2)

## 2018-08-06 LAB — CMP (CANCER CENTER ONLY)
ALT: 23 U/L (ref 0–44)
AST: 24 U/L (ref 15–41)
Albumin: 4.2 g/dL (ref 3.5–5.0)
Alkaline Phosphatase: 64 U/L (ref 38–126)
Anion gap: 12 (ref 5–15)
BUN: 29 mg/dL — ABNORMAL HIGH (ref 8–23)
CO2: 21 mmol/L — AB (ref 22–32)
Calcium: 9.5 mg/dL (ref 8.9–10.3)
Chloride: 107 mmol/L (ref 98–111)
Creatinine: 1.13 mg/dL — ABNORMAL HIGH (ref 0.44–1.00)
GFR, Est AFR Am: 55 mL/min — ABNORMAL LOW (ref >60)
GFR, Estimated: 47 mL/min — ABNORMAL LOW (ref >60)
Glucose, Bld: 232 mg/dL — ABNORMAL HIGH (ref 70–99)
POTASSIUM: 4 mmol/L (ref 3.5–5.1)
SODIUM: 140 mmol/L (ref 135–145)
Total Bilirubin: 0.5 mg/dL (ref 0.3–1.2)
Total Protein: 6.6 g/dL (ref 6.5–8.1)

## 2018-08-06 MED ORDER — DIPHENHYDRAMINE HCL 25 MG PO CAPS
50.0000 mg | ORAL_CAPSULE | Freq: Once | ORAL | Status: AC
  Administered 2018-08-06: 50 mg via ORAL

## 2018-08-06 MED ORDER — DIPHENHYDRAMINE HCL 25 MG PO CAPS
ORAL_CAPSULE | ORAL | Status: AC
  Filled 2018-08-06: qty 2

## 2018-08-06 MED ORDER — MONTELUKAST SODIUM 10 MG PO TABS
10.0000 mg | ORAL_TABLET | Freq: Once | ORAL | Status: AC
  Administered 2018-08-06: 10 mg via ORAL

## 2018-08-06 MED ORDER — METHYLPREDNISOLONE SODIUM SUCC 125 MG IJ SOLR
125.0000 mg | Freq: Once | INTRAMUSCULAR | Status: AC
  Administered 2018-08-06: 125 mg via INTRAVENOUS

## 2018-08-06 MED ORDER — MONTELUKAST SODIUM 10 MG PO TABS
ORAL_TABLET | ORAL | Status: AC
  Filled 2018-08-06: qty 1

## 2018-08-06 MED ORDER — SODIUM CHLORIDE 0.9 % IV SOLN
Freq: Once | INTRAVENOUS | Status: AC
  Administered 2018-08-06: 10:00:00 via INTRAVENOUS
  Filled 2018-08-06: qty 250

## 2018-08-06 MED ORDER — ACETAMINOPHEN 325 MG PO TABS
ORAL_TABLET | ORAL | Status: AC
  Filled 2018-08-06: qty 2

## 2018-08-06 MED ORDER — SODIUM CHLORIDE 0.9 % IV SOLN
16.0000 mg/kg | Freq: Once | INTRAVENOUS | Status: AC
  Administered 2018-08-06: 900 mg via INTRAVENOUS
  Filled 2018-08-06: qty 40

## 2018-08-06 MED ORDER — METHYLPREDNISOLONE SODIUM SUCC 125 MG IJ SOLR
INTRAMUSCULAR | Status: AC
  Filled 2018-08-06: qty 2

## 2018-08-06 MED ORDER — ACETAMINOPHEN 325 MG PO TABS
650.0000 mg | ORAL_TABLET | Freq: Once | ORAL | Status: AC
  Administered 2018-08-06: 650 mg via ORAL

## 2018-08-06 NOTE — Patient Instructions (Signed)
Cancer Center Discharge Instructions for Patients Receiving Chemotherapy  Today you received the following chemotherapy agents: Darzalex  To help prevent nausea and vomiting after your treatment, we encourage you to take your nausea medication as directed.    If you develop nausea and vomiting that is not controlled by your nausea medication, call the clinic.   BELOW ARE SYMPTOMS THAT SHOULD BE REPORTED IMMEDIATELY:  *FEVER GREATER THAN 100.5 F  *CHILLS WITH OR WITHOUT FEVER  NAUSEA AND VOMITING THAT IS NOT CONTROLLED WITH YOUR NAUSEA MEDICATION  *UNUSUAL SHORTNESS OF BREATH  *UNUSUAL BRUISING OR BLEEDING  TENDERNESS IN MOUTH AND THROAT WITH OR WITHOUT PRESENCE OF ULCERS  *URINARY PROBLEMS  *BOWEL PROBLEMS  UNUSUAL RASH Items with * indicate a potential emergency and should be followed up as soon as possible.  Feel free to call the clinic should you have any questions or concerns. The clinic phone number is [phone].  Please show the CHEMO ALERT CARD at check-in to the Emergency Department and triage nurse.   

## 2018-08-07 ENCOUNTER — Telehealth: Payer: Self-pay | Admitting: Hematology

## 2018-08-07 NOTE — Telephone Encounter (Deleted)
Scheduled appt per 01/24 los.  Called patient and patient is aware of appt.

## 2018-08-07 NOTE — Telephone Encounter (Signed)
Scheduled appt per 01/23 los.  Called patient and patient is aware of appt.

## 2018-08-10 ENCOUNTER — Telehealth: Payer: Self-pay | Admitting: *Deleted

## 2018-08-10 NOTE — Telephone Encounter (Signed)
TCT patient regarding recent her recent lab results.  Per Dr. Burr Medico, her kidney functions tests (BUN/Creatinine) were elevated.  Pt is encouraged to increase her fluid (water) intake , to stay well hydrated.  VM message left for her with above information

## 2018-08-10 NOTE — Telephone Encounter (Signed)
-----   Message from [name], MD sent at 08/07/2018  8:40 PM EST ----- Please let her CMP result, Cr slightly elevated, encourage her to drink fluids adequately. Thanks  [name]  08/07/2018

## 2018-08-12 ENCOUNTER — Encounter: Payer: Self-pay | Admitting: Family Medicine

## 2018-08-12 ENCOUNTER — Ambulatory Visit (HOSPITAL_COMMUNITY): Payer: Medicare Other | Attending: Cardiology

## 2018-08-12 DIAGNOSIS — I429 Cardiomyopathy, unspecified: Secondary | ICD-10-CM | POA: Insufficient documentation

## 2018-08-12 DIAGNOSIS — C9002 Multiple myeloma in relapse: Secondary | ICD-10-CM | POA: Diagnosis not present

## 2018-08-12 DIAGNOSIS — I358 Other nonrheumatic aortic valve disorders: Secondary | ICD-10-CM | POA: Insufficient documentation

## 2018-08-12 DIAGNOSIS — I5042 Chronic combined systolic (congestive) and diastolic (congestive) heart failure: Secondary | ICD-10-CM | POA: Diagnosis not present

## 2018-08-12 DIAGNOSIS — I2510 Atherosclerotic heart disease of native coronary artery without angina pectoris: Secondary | ICD-10-CM | POA: Diagnosis not present

## 2018-08-18 ENCOUNTER — Ambulatory Visit (INDEPENDENT_AMBULATORY_CARE_PROVIDER_SITE_OTHER): Payer: Medicare Other | Admitting: Family Medicine

## 2018-08-18 ENCOUNTER — Emergency Department (HOSPITAL_COMMUNITY)
Admission: EM | Admit: 2018-08-18 | Discharge: 2018-08-18 | Discharge status: Absent without leave | Payer: Medicare Other

## 2018-08-18 ENCOUNTER — Encounter: Payer: Self-pay | Admitting: Family Medicine

## 2018-08-18 ENCOUNTER — Inpatient Hospital Stay (HOSPITAL_BASED_OUTPATIENT_CLINIC_OR_DEPARTMENT_OTHER): Payer: Medicare Other | Admitting: Medical

## 2018-08-18 ENCOUNTER — Inpatient Hospital Stay: Payer: Medicare Other | Attending: Hematology | Admitting: Medical

## 2018-08-18 ENCOUNTER — Other Ambulatory Visit: Payer: Self-pay | Admitting: Medical

## 2018-08-18 ENCOUNTER — Other Ambulatory Visit: Payer: Medicare Other

## 2018-08-18 ENCOUNTER — Ambulatory Visit (HOSPITAL_COMMUNITY)
Admission: RE | Admit: 2018-08-18 | Discharge: 2018-08-18 | Disposition: A | Discharge status: Home / Self Care | Payer: Medicare Other | Source: Ambulatory Visit | Attending: Medical | Admitting: Medical

## 2018-08-18 VITALS — BP 135/74 | HR 93 | Temp 99.3°F | Resp 18

## 2018-08-18 VITALS — BP 92/52 | HR 85 | Temp 98.1°F | Ht 67.0 in | Wt 121.2 lb

## 2018-08-18 DIAGNOSIS — C9002 Multiple myeloma in relapse: Secondary | ICD-10-CM | POA: Diagnosis not present

## 2018-08-18 DIAGNOSIS — J209 Acute bronchitis, unspecified: Secondary | ICD-10-CM | POA: Insufficient documentation

## 2018-08-18 DIAGNOSIS — R05 Cough: Secondary | ICD-10-CM | POA: Diagnosis not present

## 2018-08-18 DIAGNOSIS — R509 Fever, unspecified: Secondary | ICD-10-CM | POA: Diagnosis not present

## 2018-08-18 DIAGNOSIS — G629 Polyneuropathy, unspecified: Secondary | ICD-10-CM | POA: Insufficient documentation

## 2018-08-18 DIAGNOSIS — Z5112 Encounter for antineoplastic immunotherapy: Secondary | ICD-10-CM | POA: Insufficient documentation

## 2018-08-18 DIAGNOSIS — I252 Old myocardial infarction: Secondary | ICD-10-CM | POA: Insufficient documentation

## 2018-08-18 DIAGNOSIS — J069 Acute upper respiratory infection, unspecified: Secondary | ICD-10-CM

## 2018-08-18 DIAGNOSIS — Z9221 Personal history of antineoplastic chemotherapy: Secondary | ICD-10-CM | POA: Insufficient documentation

## 2018-08-18 DIAGNOSIS — D61818 Other pancytopenia: Secondary | ICD-10-CM | POA: Insufficient documentation

## 2018-08-18 DIAGNOSIS — Z7982 Long term (current) use of aspirin: Secondary | ICD-10-CM | POA: Diagnosis not present

## 2018-08-18 DIAGNOSIS — Z79899 Other long term (current) drug therapy: Secondary | ICD-10-CM

## 2018-08-18 DIAGNOSIS — Z923 Personal history of irradiation: Secondary | ICD-10-CM | POA: Diagnosis not present

## 2018-08-18 DIAGNOSIS — M810 Age-related osteoporosis without current pathological fracture: Secondary | ICD-10-CM | POA: Diagnosis not present

## 2018-08-18 DIAGNOSIS — M910 Juvenile osteochondrosis of pelvis: Secondary | ICD-10-CM

## 2018-08-18 DIAGNOSIS — R062 Wheezing: Secondary | ICD-10-CM

## 2018-08-18 LAB — URINALYSIS, COMPLETE (UACMP) WITH MICROSCOPIC
Bacteria, UA: NONE SEEN
Bilirubin Urine: NEGATIVE
Glucose, UA: NEGATIVE mg/dL
Hgb urine dipstick: NEGATIVE
Ketones, ur: 5 mg/dL — AB
Leukocytes, UA: NEGATIVE
Nitrite: NEGATIVE
Protein, ur: 30 mg/dL — AB
Specific Gravity, Urine: 1.013 (ref 1.005–1.030)
pH: 7 (ref 5.0–8.0)

## 2018-08-18 LAB — CBC WITH DIFFERENTIAL (CANCER CENTER ONLY)
Abs Immature Granulocytes: 0.02 10*3/uL (ref 0.00–0.07)
Basophils Absolute: 0 10*3/uL (ref 0.0–0.1)
Basophils Relative: 0 %
Eosinophils Absolute: 0 10*3/uL (ref 0.0–0.5)
Eosinophils Relative: 0 %
HEMATOCRIT: 35.3 % — AB (ref 36.0–46.0)
Hemoglobin: 11.8 g/dL — ABNORMAL LOW (ref 12.0–15.0)
Immature Granulocytes: 0 %
Lymphocytes Relative: 4 %
Lymphs Abs: 0.2 10*3/uL — ABNORMAL LOW (ref 0.7–4.0)
MCH: 34.4 pg — ABNORMAL HIGH (ref 26.0–34.0)
MCHC: 33.4 g/dL (ref 30.0–36.0)
MCV: 102.9 fL — ABNORMAL HIGH (ref 80.0–100.0)
Monocytes Absolute: 0.2 10*3/uL (ref 0.1–1.0)
Monocytes Relative: 5 %
Neutro Abs: 4.9 10*3/uL (ref 1.7–7.7)
Neutrophils Relative %: 91 %
PLATELETS: 118 10*3/uL — AB (ref 150–400)
RBC: 3.43 MIL/uL — ABNORMAL LOW (ref 3.87–5.11)
RDW: 15.6 % — AB (ref 11.5–15.5)
WBC Count: 5.4 10*3/uL (ref 4.0–10.5)
nRBC: 0 % (ref 0.0–0.2)

## 2018-08-18 LAB — CMP (CANCER CENTER ONLY)
ALBUMIN: 3.8 g/dL (ref 3.5–5.0)
ALT: 33 U/L (ref 0–44)
AST: 32 U/L (ref 15–41)
Alkaline Phosphatase: 78 U/L (ref 38–126)
Anion gap: 9 (ref 5–15)
BUN: 14 mg/dL (ref 8–23)
CO2: 27 mmol/L (ref 22–32)
Calcium: 9.2 mg/dL (ref 8.9–10.3)
Chloride: 99 mmol/L (ref 98–111)
Creatinine: 0.93 mg/dL (ref 0.44–1.00)
GFR, Est AFR Am: >60 mL/min (ref >60)
GFR, Estimated: 60 mL/min — ABNORMAL LOW (ref >60)
GLUCOSE: 133 mg/dL — AB (ref 70–99)
Potassium: 3.5 mmol/L (ref 3.5–5.1)
Sodium: 135 mmol/L (ref 135–145)
Total Bilirubin: 1.2 mg/dL (ref 0.3–1.2)
Total Protein: 6.7 g/dL (ref 6.5–8.1)

## 2018-08-18 MED ORDER — PREDNISONE 5 MG PO TABS: ORAL_TABLET | ORAL | 0 refills | Status: DC

## 2018-08-18 MED ORDER — ALBUTEROL SULFATE (2.5 MG/3ML) 0.083% IN NEBU
INHALATION_SOLUTION | RESPIRATORY_TRACT | Status: AC
  Filled 2018-08-18: qty 3

## 2018-08-18 MED ORDER — ALBUTEROL SULFATE (2.5 MG/3ML) 0.083% IN NEBU: 2.5000 mg | INHALATION_SOLUTION | Freq: Four times a day (QID) | RESPIRATORY_TRACT | 12 refills | Status: DC | PRN

## 2018-08-18 MED ORDER — DOXYCYCLINE HYCLATE 100 MG PO TABS: 100.0000 mg | ORAL_TABLET | Freq: Two times a day (BID) | ORAL | 0 refills | Status: DC

## 2018-08-18 MED ORDER — ALBUTEROL SULFATE (2.5 MG/3ML) 0.083% IN NEBU
2.5000 mg | INHALATION_SOLUTION | Freq: Once | RESPIRATORY_TRACT | Status: AC
  Administered 2018-08-18: 2.5 mg via RESPIRATORY_TRACT
  Filled 2018-08-18: qty 3

## 2018-08-18 NOTE — Progress Notes (Signed)
Pt taken to radiology for CXR by [facility] after lab draw today.  Will return to [facility] afterwards for evaluation by PA Lucianne Lei.  Presents today currently afebrile with recent fever of 101 at home treated with tylenol.  Pt reports fatigue, SOB/DOE, cough with greenish sputum, and gen weakness.  Denies CP or N/V/D.  Pt given albuterol nebulizer treatment.  Tolerated well.  Reports feeling dec SOB/DOE.  Voice/breathing audibly clearer when pt speaking.

## 2018-08-18 NOTE — Progress Notes (Addendum)
[facility] Progress Note   NYKERRIA MACCONNELL [id number] 10-19-1940 78 y.o.  Karel Jarvis is managed by Dr. [name]   Actively treated with chemotherapy/immunotherapy/hormonal therapy: yes  Current Therapy: Darzalex  Last Treated: 08/06/2018 (C46, D1)   Assessment: Plan:    Wheezing - Plan: albuterol (PROVENTIL) (2.5 MG/3ML) 0.083% nebulizer solution 2.5 mg, albuterol (PROVENTIL) (2.5 MG/3ML) 0.083% nebulizer solution, predniSONE (DELTASONE) 5 MG tablet  Upper respiratory tract infection, unspecified type - Plan: doxycycline (VIBRA-TABS) 100 MG tablet, predniSONE (DELTASONE) 5 MG tablet  Multiple myeloma in relapse (HCC)   URI with wheezing and recent fevers: A CBC, chemistry panel, urinalysis, urine culture and blood cultures x 2 were collected today. A chest x-ray returned showing:  1.  Mild left base atelectasis. 2. Stable multiple bony deformities with known multiple myeloma. No new lesions identified.  The patient was seen with Dr. Burr Medico. Mrs. Secord was given an albuterol nebulizer with improvement in her symptoms. She was given a prescription for albuterol solution for her home nebulizer, doxycycline 100 mg PO BID x 7 days, and a 6 day prednisone taper.  Multiple myeloma with relapse: Mrs. Velador is status post cycle 46, day 1 of Darzalex which was dosed on 08/06/2018. She is scheduled to see Dr. Burr Medico on 08/27/2018 when she is scheduled for her next cycle of Darlalex.   Please see After Visit Summary for patient specific instructions.  Future Appointments  Date Time Provider [place]  08/27/2018 12:15 PM [facility] LAB 1 [facility] None  08/27/2018  1:00 PM [facility] INFUSION [facility] None  09/02/2018  9:30 AM Dunn, Nedra Hai, PA-C [facility]  09/24/2018 10:15 AM [facility] LAB 1 [facility] None  09/24/2018 10:45 AM [name], MD [facility] None  09/24/2018 11:30 AM [facility] INFUSION [facility] None    No orders of the  defined types were placed in this encounter.      Subjective:   Patient ID:  LAQUETTA RACEY is a 78 y.o. (DOB [birth year]) female.  Chief Complaint:  Chief Complaint  Patient presents with  . Fatigue    HPI IDIL MASLANKA is a 78 year old female with a relapsed multiple myeloma who is managed by Dr. Burr Medico. Mrs. Simic is status post cycle 46, day 1 of Darzalex which was dosed on 08/06/2018. She was seen by her primary care provider Dr. Talbert Cage early today. He initially recommended that she be see in the ER as she has had a productive cough, fever (100.2 on Saturday and 101.9 today), shortness of breath, mild nausea without vomiting, and mild diarrhea since last Friday. She has a history of pneumonia. She has an albuterol nebulizer at home but no solution.  Medications: I have reviewed the patient's current medications.  Allergies:  Allergies  Allergen Reactions  . Zithromax [Azithromycin] Rash    Full body rash  . Zosyn [Piperacillin Sod-Tazobactam So] Swelling and Rash    Itching, rash and swelling of extremities Tolerates Rocephin  . Quinolones Rash    Cipro, Levaquin implicated Cipro also caused vomiting and abd pain    Past Medical History:  Diagnosis Date  . CAD (coronary artery disease)    a. inf STEMI (in [place] >>> [facility]ytics) >>> LHC (8/15- [place]):  pRCA 60%, mid to dist RCA 50% >>> med rx  . Cardiomyopathy ([place])   . Chronic combined systolic and diastolic CHF (congestive heart failure) ([place])   . Cord compression ([place]) 07/13/12   MRI- diffuse myeloma involvement of  T-L spine  . History of radiation therapy 07/13/12-07/27/12   spinal cord compression T3-T10,left scapula  . Hx of echocardiogram    Echo (03/02/14-done at [facility]):  Normal LVEF without significant regional wall motion abnormalities. Mild   . Multiple myeloma ([place]) 07/01/2012  . MVP (mitral valve prolapse)    a. trivial by echo 06/2017.  .  OSTEOPOROSIS 06/11/2010   Multiple compression fractures; and spontaneous fracture of sternum Qualifier: Diagnosis of  By: [name], [name]     . Thoracic kyphosis 07/13/12   per MRI scan  . Unspecified deficiency anemia     Past Surgical History:  Procedure Laterality Date  . APPENDECTOMY    . CESAREAN SECTION     x2   . COLONOSCOPY  2007   neg with Dr. [name]  . ELBOW SURGERY    . HIP SURGERY  2009   left  . TUBAL LIGATION      Family History  Problem Relation Age of Onset  . Glaucoma Mother   . Heart disease Mother   . Peripheral vascular disease Mother   . Raynaud syndrome Mother   . Heart disease Father   . Heart failure Father   . Glaucoma Father   . Cancer Brother        stage IV prostate CA  . Raynaud syndrome Daughter   . Raynaud syndrome Daughter     Social History   Socioeconomic History  . Marital status: Married    Spouse name: Fritz Pickerel  . Number of children: 2  . Years of education: Not on file  . Highest education level: Not on file  Occupational History  . Occupation: Museum/gallery conservator: [employer]  . Financial resource strain: Not on file  . Food insecurity:    Worry: Not on file    Inability: Not on file  . Transportation needs:    Medical: Not on file    Non-medical: Not on file  Tobacco Use  . Smoking status: Never Smoker  . Smokeless tobacco: Never Used  Substance and Sexual Activity  . Alcohol use: Yes    Alcohol/week: 7.0 standard drinks    Types: 7 Standard drinks or equivalent per week    Comment: 1 glass of wine or beer 3-5 days per week  . Drug use: No  . Sexual activity: Yes  Lifestyle  . Physical activity:    Days per week: Not on file    Minutes per session: Not on file  . Stress: Not on file  Relationships  . Social connections:    Talks on phone: Not on file    Gets together: Not on file    Attends religious service: Not on file    Active member of club or organization: Not on  file    Attends meetings of clubs or organizations: Not on file    Relationship status: Not on file  . Intimate partner violence:    Fear of current or ex partner: Not on file    Emotionally abused: Not on file    Physically abused: Not on file    Forced sexual activity: Not on file  Other Topics Concern  . Not on file  Social History Narrative   Health Care POA:    Emergency Contact: spouse, Letisia Schwalb [phone]   End of Life Plan:    Who lives with you: spouse, 2 story home   Any pets: Dog, "AKU"  Diet: Patient has a varied diet of protein, vegetables, starches.  Limits red meats.   Exercise: Patient exercises atleast 3 times a week for over an hour.   Seatbelts: Patient reports wearing her seatbelt when in vehicle.    Nancy Fetter Exposure/Protection: Patient reports wearing sunscreen daily.   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 Ducatte, RN, BSN                                                                                                        Past Medical History, Surgical history, Social history, and Family history were reviewed and updated as appropriate.   Please see review of systems for further details on the patient's review from today.   Review of Systems:  Review of Systems  Constitutional: Positive for fever. Negative for appetite change, chills and  diaphoresis.  HENT: Negative for postnasal drip, rhinorrhea, sinus pressure, sinus pain and trouble swallowing.   Respiratory: Positive for cough and shortness of breath. Negative for choking, chest tightness, wheezing and stridor.   Cardiovascular: Negative for chest pain, palpitations and leg swelling.  Gastrointestinal: Positive for diarrhea and nausea. Negative for abdominal distention, abdominal pain, blood in stool, constipation and vomiting.  Genitourinary: Negative for decreased urine volume and difficulty urinating.  Neurological: Negative for weakness.    Objective:   Physical Exam:  BP 135/74 (BP Location: Right Arm, Patient Position: Sitting)   Pulse 93   Temp 99.3 F (37.4 C) (Oral)   Resp 18   SpO2 97%  ECOG: 1  Physical Exam Constitutional:      General: She is not in acute distress.    Appearance: She is not diaphoretic.  HENT:     Head: Normocephalic and atraumatic.     Right Ear: External ear normal.     Left Ear: External ear normal.     Mouth/Throat:     Pharynx: No oropharyngeal exudate.  Neck:     Musculoskeletal: Normal range of motion and neck supple.  Cardiovascular:     Rate and Rhythm: Normal rate and regular rhythm.     Heart sounds: Normal heart sounds. No murmur. No friction rub. No gallop.   Pulmonary:     Effort: Pulmonary effort is normal. No respiratory distress.     Breath sounds: Wheezing present. No rales.  Musculoskeletal:     Comments: Moderate anterior kyphosis  Lymphadenopathy:     Cervical: No cervical adenopathy.  Skin:    General: Skin is warm  and dry.     Findings: No erythema or rash.  Neurological:     Mental Status: She is alert.     Coordination: Coordination normal.  Psychiatric:        Behavior: Behavior normal.        Thought Content: Thought content normal.        Judgment: Judgment normal.    Pre-albuterol exam: See above physical exam  Post-albuterol exam: wheezes have resolved after an albuterol nebulizer  treatment  Lab Review:     Component Value Date/Time   NA 135 08/18/2018 1311   NA 140 07/18/2017 0808   K 3.5 08/18/2018 1311   K 3.9 07/18/2017 0808   CL 99 08/18/2018 1311   CL 106 11/26/2012 0936   CO2 27 08/18/2018 1311   CO2 27 07/18/2017 0808   GLUCOSE 133 (H) 08/18/2018 1311   GLUCOSE 68 (L) 07/18/2017 0808   GLUCOSE 77 11/26/2012 0936   BUN 14 08/18/2018 1311   BUN 15.1 07/18/2017 0808   CREATININE 0.93 08/18/2018 1311   CREATININE 0.8 07/18/2017 0808   CALCIUM 9.2 08/18/2018 1311   CALCIUM 9.0 07/18/2017 0808   PROT 6.7 08/18/2018 1311   PROT 5.8 (L) 07/18/2017 0808   ALBUMIN 3.8 08/18/2018 1311   ALBUMIN 3.7 07/18/2017 0808   AST 32 08/18/2018 1311   AST 19 07/18/2017 0808   ALT 33 08/18/2018 1311   ALT 17 07/18/2017 0808   ALKPHOS 78 08/18/2018 1311   ALKPHOS 51 07/18/2017 0808   BILITOT 1.2 08/18/2018 1311   BILITOT 0.50 07/18/2017 0808   GFRNONAA 60 (L) 08/18/2018 1311   GFRNONAA 67 05/08/2012 0922   GFRAA >60 08/18/2018 1311   GFRAA 78 05/08/2012 0922       Component Value Date/Time   WBC 5.4 08/18/2018 1311   WBC 4.5 01/27/2018 0610   RBC 3.43 (L) 08/18/2018 1311   HGB 11.8 (L) 08/18/2018 1311   HGB 11.2 (L) 07/18/2017 0808   HCT 35.3 (L) 08/18/2018 1311   HCT 33.9 (L) 07/18/2017 0808   PLT 118 (L) 08/18/2018 1311   PLT 170 07/18/2017 0808   MCV 102.9 (H) 08/18/2018 1311   MCV 108.3 (H) 07/18/2017 0808   MCH 34.4 (H) 08/18/2018 1311   MCHC 33.4 08/18/2018 1311   RDW 15.6 (H) 08/18/2018 1311   RDW 14.8 (H) 07/18/2017 0808   LYMPHSABS 0.2 (L) 08/18/2018 1311   LYMPHSABS 0.3 (L) 07/18/2017 0808   MONOABS 0.2 08/18/2018 1311   MONOABS 0.3 07/18/2017 0808   EOSABS 0.0 08/18/2018 1311   EOSABS 0.1 07/18/2017 0808   BASOSABS 0.0 08/18/2018 1311   BASOSABS 0.0 07/18/2017 0808   -------------------------------  Imaging from last 24 hours (if applicable):  Radiology interpretation: Dg Chest 2 View  Result Date: 08/18/2018 CLINICAL DATA:   Fever and cough.  Multiple myeloma. EXAM: CHEST - 2 VIEW COMPARISON:  Bone survey 05/15/2018. Chest x-ray 03/31/2018. Chest CT 12/30/2017. FINDINGS: Mediastinum and hilar structures normal. Heart size normal. Mild left base atelectasis. Stable bilateral pleural-parenchymal thickening most consistent scarring. Stable sternal and rib deformities. Stable multiple thoracic vertebral body compression fractures. Stable left clavicular deformity. IMPRESSION: 1.  Mild left base atelectasis. 2. Stable multiple bony deformities as above in this patient with known multiple myeloma. No new lesions identified. Electronically Signed   By: Marcello Moores  Register   On: 08/18/2018 14:24        This patient was seen with Dr. Burr Medico with my treatment plan reviewed with her. She  expressed agreement with my medical management of this patient.   Addendum  I have seen the patient, examined her. I agree with the assessment and and plan and have edited the notes.    Mrs Szuch came in today for fever, wheezing and productive cough, she denies chemo pain or dyspnea, not hypoxic at rest or ambulatory. Lab and CXR reviewed. Will treat her URI with antibiotics doxycycline, bronchodilator and taper steroids. She knows to call us or go to ED if her symptoms get worse. F/u next week.   [name]  08/18/2018

## 2018-08-18 NOTE — Progress Notes (Addendum)
Subjective  Shannon Rush is a 78 y.o. female is presenting with the following   FEVER Has been having chest congestion on and off for last 10 days.  Feeling somewhat ill.  This morning had fever of 101.  No specific new symptoms.  Ongoing mild shortness of breath and scant sputum.  No new chest pain or dysuria or rash or back pain or skin complaints  PMH - undergoing chemo for MM.  Last visit with Dr Burr Medico in Jan was immunocompromised.    Chief Complaint noted Review of Symptoms - see HPI PMH - Smoking status noted.    Objective Vital Signs reviewed BP (!) 92/52   Pulse 85   Temp 98.1 F (36.7 C) (Oral)   Ht 5\' 7"  (1.702 m)   Wt 121 lb 3.2 oz (55 kg)   SpO2 90%   BMI 18.98 kg/m  Alert nad Lungs - scant crackles at R base.  Moving air well Heart - Regular rate and rhythm.  No murmurs, gallops or rubs.    Abdomen - mild diffuse tenderness no guarding or rebound Skin:  Intact without suspicious lesions or rashes Mouth - no lesions, mucous membranes are moist, no decaying teeth   Throat: normal mucosa, no exudate, uvula midline, no redness Neck:  No deformities, thyromegaly, masses, or tenderness noted.   Supple with full range of motion without pain.  Assessments/Plans FEVER - in immunocompromised patient.  No specific sources but likely pneumonia.   Attempted to call Dr Burr Medico but she was out.  Left detailed message with my cell number on her nurse's vm.  Called [facility] ER and let them know I had sent her for probable admission.  Will need BC and IV antibiotics   See after visit summary for details of patient instuctions  Dr Burr Medico returned call after patient had left.  Recommended patient go to [facility] center.  Unable to find cell phone number for patient or husband.   Called ER and asked charge nurse to deliver these instructions to patient   No problem-specific Assessment & Plan notes found for this encounter.

## 2018-08-19 ENCOUNTER — Telehealth: Payer: Self-pay | Admitting: *Deleted

## 2018-08-19 ENCOUNTER — Encounter: Payer: Self-pay | Admitting: Medical

## 2018-08-19 DIAGNOSIS — I2510 Atherosclerotic heart disease of native coronary artery without angina pectoris: Secondary | ICD-10-CM

## 2018-08-19 LAB — URINE CULTURE: Culture: NO GROWTH

## 2018-08-19 NOTE — Progress Notes (Signed)
These preliminary result these preliminary results were noted.  Awaiting final report.

## 2018-08-19 NOTE — Telephone Encounter (Signed)
-----   Message from Charlie Pitter, [place] sent at 08/19/2018 12:00 PM EST ----- Please let patient know echo overall appears similar to previous one from 04/2018. 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 Meda Coffee has known is part of her "normal"). Keep f/u as planned, and arrange repeat echo for 3-4 months - this is being followed while on chemo.  Dayna Dunn PA-C

## 2018-08-21 ENCOUNTER — Telehealth: Payer: Self-pay

## 2018-08-21 ENCOUNTER — Other Ambulatory Visit: Payer: Self-pay | Admitting: Hematology

## 2018-08-21 MED ORDER — GUAIFENESIN-CODEINE 100-10 MG/5ML PO SOLN: 5.0000 mL | Freq: Four times a day (QID) | ORAL | 0 refills | Status: DC | PRN

## 2018-08-21 NOTE — Telephone Encounter (Signed)
Spoke with patient regarding previous message, per Dr. Burr Medico she may try Robitussin OTC if not enough she has sent in Guaifenesin-Codeine.  She verbalized an understanding.

## 2018-08-21 NOTE — Progress Notes (Signed)
These preliminary result these preliminary results were noted.  Awaiting final report.

## 2018-08-21 NOTE — Telephone Encounter (Signed)
Patient was told to call back today to let Dr. Burr Medico know how she is feeling.  She states she has had no fever for 2 days, still has chest congestion and a cough, she states she is drinking lots of fluids feels hydrated.  She would like a recommendation on something for the cough either prescription or OTC that will help.  [phone]

## 2018-08-23 LAB — CULTURE, BLOOD (SINGLE)
Culture: NO GROWTH
Culture: NO GROWTH
Special Requests: ADEQUATE

## 2018-08-27 ENCOUNTER — Inpatient Hospital Stay: Payer: Medicare Other

## 2018-08-27 ENCOUNTER — Inpatient Hospital Stay (HOSPITAL_BASED_OUTPATIENT_CLINIC_OR_DEPARTMENT_OTHER): Payer: Medicare Other | Admitting: Hematology

## 2018-08-27 DIAGNOSIS — I252 Old myocardial infarction: Secondary | ICD-10-CM

## 2018-08-27 DIAGNOSIS — J209 Acute bronchitis, unspecified: Secondary | ICD-10-CM | POA: Diagnosis not present

## 2018-08-27 DIAGNOSIS — Z5112 Encounter for antineoplastic immunotherapy: Secondary | ICD-10-CM | POA: Diagnosis not present

## 2018-08-27 DIAGNOSIS — T451X5A Adverse effect of antineoplastic and immunosuppressive drugs, initial encounter: Secondary | ICD-10-CM

## 2018-08-27 DIAGNOSIS — G629 Polyneuropathy, unspecified: Secondary | ICD-10-CM

## 2018-08-27 DIAGNOSIS — C9000 Multiple myeloma not having achieved remission: Secondary | ICD-10-CM

## 2018-08-27 DIAGNOSIS — C9002 Multiple myeloma in relapse: Secondary | ICD-10-CM

## 2018-08-27 DIAGNOSIS — Z9221 Personal history of antineoplastic chemotherapy: Secondary | ICD-10-CM | POA: Diagnosis not present

## 2018-08-27 DIAGNOSIS — Z79899 Other long term (current) drug therapy: Secondary | ICD-10-CM | POA: Diagnosis not present

## 2018-08-27 DIAGNOSIS — Z923 Personal history of irradiation: Secondary | ICD-10-CM | POA: Diagnosis not present

## 2018-08-27 DIAGNOSIS — Z7982 Long term (current) use of aspirin: Secondary | ICD-10-CM

## 2018-08-27 DIAGNOSIS — D61818 Other pancytopenia: Secondary | ICD-10-CM | POA: Diagnosis not present

## 2018-08-27 DIAGNOSIS — M810 Age-related osteoporosis without current pathological fracture: Secondary | ICD-10-CM | POA: Diagnosis not present

## 2018-08-27 DIAGNOSIS — D6481 Anemia due to antineoplastic chemotherapy: Secondary | ICD-10-CM

## 2018-08-27 DIAGNOSIS — M818 Other osteoporosis without current pathological fracture: Secondary | ICD-10-CM | POA: Diagnosis not present

## 2018-08-27 LAB — CMP (CANCER CENTER ONLY)
ALT: 16 U/L (ref 0–44)
AST: 18 U/L (ref 15–41)
Albumin: 3.3 g/dL — ABNORMAL LOW (ref 3.5–5.0)
Alkaline Phosphatase: 71 U/L (ref 38–126)
Anion gap: 8 (ref 5–15)
BUN: 20 mg/dL (ref 8–23)
CO2: 27 mmol/L (ref 22–32)
Calcium: 9 mg/dL (ref 8.9–10.3)
Chloride: 105 mmol/L (ref 98–111)
Creatinine: 0.8 mg/dL (ref 0.44–1.00)
GFR, Estimated: >60 mL/min (ref >60)
Glucose, Bld: 78 mg/dL (ref 70–99)
Potassium: 4 mmol/L (ref 3.5–5.1)
Sodium: 140 mmol/L (ref 135–145)
Total Bilirubin: 0.6 mg/dL (ref 0.3–1.2)
Total Protein: 6 g/dL — ABNORMAL LOW (ref 6.5–8.1)

## 2018-08-27 LAB — CBC WITH DIFFERENTIAL (CANCER CENTER ONLY)
Abs Immature Granulocytes: 0.04 10*3/uL (ref 0.00–0.07)
Basophils Absolute: 0 10*3/uL (ref 0.0–0.1)
Basophils Relative: 0 %
Eosinophils Absolute: 0 10*3/uL (ref 0.0–0.5)
Eosinophils Relative: 1 %
HEMATOCRIT: 34.3 % — AB (ref 36.0–46.0)
Hemoglobin: 11.3 g/dL — ABNORMAL LOW (ref 12.0–15.0)
Immature Granulocytes: 1 %
Lymphocytes Relative: 7 %
Lymphs Abs: 0.3 10*3/uL — ABNORMAL LOW (ref 0.7–4.0)
MCH: 33.9 pg (ref 26.0–34.0)
MCHC: 32.9 g/dL (ref 30.0–36.0)
MCV: 103 fL — ABNORMAL HIGH (ref 80.0–100.0)
Monocytes Absolute: 0.3 10*3/uL (ref 0.1–1.0)
Monocytes Relative: 5 %
NRBC: 0 % (ref 0.0–0.2)
Neutro Abs: 4 10*3/uL (ref 1.7–7.7)
Neutrophils Relative %: 86 %
Platelet Count: 182 10*3/uL (ref 150–400)
RBC: 3.33 MIL/uL — ABNORMAL LOW (ref 3.87–5.11)
RDW: 15.2 % (ref 11.5–15.5)
WBC Count: 4.7 10*3/uL (ref 4.0–10.5)

## 2018-08-27 NOTE — Progress Notes (Signed)
Patient will not receive treatment today per Dr. Burr Medico

## 2018-08-27 NOTE — Progress Notes (Signed)
[place]   [phone] [phone]) [phone]   Clinic Follow up Note   Patient Care Team: [name], MD as PCP - General (Family Medicine) [name], MD as PCP - Cardiology (Cardiology) Stefanie Libel, MD (Family Medicine) [name], MD as Attending Physician (Obstetrics and Gynecology) [name], MD as Consulting Physician (Internal Medicine) [name], MD as Consulting Physician (Hematology) [name], MD as Consulting Physician (Ophthalmology) [name], MD as Consulting Physician (Cardiology) 08/27/2018  CHIEF COMPLAINT: F/u on MM  SUMMARY OF ONCOLOGIC HISTORY: Oncology History   Multiple myeloma [facility])   Staging form: Multiple Myeloma, AJCC 6th Edition   - Clinical: Stage IIIA - Signed by [name], MD on 09/04/2013      Multiple myeloma ([place])   05/26/2012 Initial Diagnosis    Presenting IgG was 4,040 mg/dL on 06/05/2012 (IgA 35; IgM 34); kappa free light chain 34.4 mg/dL, lambda 0.00, kappa:lambda ratio of 34.75; SPEP with M-spike of 2.60.    06/30/2012 Imaging    Numerous lytic lesions throughout the calvarium.  Lytic lesion within the left lateral scapula.  Findings most compatible with metastases or myeloma. Multiple mid and lower thoracic compression fractures.  Slight compression through the endplates at L3.    96/78/9381 Bone Marrow Biopsy    Bone marrow biopsy showed 49% plasma cell. Normal classical cytogenetics; however, myeloma FISH panel showed 13q- (intermediate risk)        07/14/2012 - 07/27/2012 Radiation Therapy    Palliative radiation 20 Gy over 10 fractions between to thoracic spine cord compression and symptomatic left scapula lytic lesion.    08/10/2012 - 11/2012 Chemotherapy    Started SQ Velcade once weekly, 3 weeks on, 1 weeks off; daily Revlimid d1-21, 7 days off; and Dexamethasone 22m PO weekly.     02/12/2013 Bone Marrow Transplant    [facility]      06/14/2013 Tumor Marker    IgG  1830,  Kappa:lamba ratio, 1.69 (baseline was 34.75 or   M-spike 0.28 (baseline of 2.6 or  89.3% of baseline).      06/25/2013 -  Chemotherapy    Started zometa 3.5 mg monthly     09/01/2013 - 01/2014 Chemotherapy    Maintenance therapy with revlimid 555mdaily for 14 days and then off for 7 (decreased from 21 days on and 7 days off based on neutropenia). Stopped due to disease progression 01/2017    09/13/2013 - 09/17/2014 Hospital Admission    Hospital admission for pneumonia    12/20/2013 Treatment Plan Change    Maintenance therapy decreased to 2.5 mg daily for 21 days and then off for 7 days based on low counts.     01/25/2014 Tumor Marker    IgG 1360 M spike 0.5    03/01/2014 - 03/05/2014 Hospital Ad[facility] received thrombolytic therapy, cath showed 60% prox RCA, mid-distal RCA 50%, Echo normal, no stents.    04/11/2014 - 08/07/2014 Chemotherapy    Continue Revlimid at 2.5 mg 3 weeks on 1 week off    08/08/2014 - 08/13/2014 Hospital Admission    Hospital admission for pneumonia. Her Revlimid was held.    08/29/2014 - 09/14/2014 Chemotherapy    Maintenance Revlimid restarted    09/14/2014 - 09/17/2014 Hospital Admission    [place]    11/13/2014 -  12/25/2015 Chemotherapy    Maintenance Revlimid restarted, changed to 2.60m daily, 2 weks on, 1 week off from 12/15/2014, stopped due to disease progression     01/24/2016 Progression    Patient is M protein has gradually increased to 1.0g, repeated a bone marrow biopsy showed plasma cell 10-30%    02/27/2016 - 03/07/2016 Chemotherapy    Pomalidomide 4 mg daily on day 1-21, dexamethasone 40 mg on day 1, 8 and 15, every 28 days, started on 02/27/2016, daratumumab weekly started on 03/07/2016, held after first dose, due to hospitalization and a severe pancytopenia,  pomalidomide was stopped afterwards     03/10/2016 - 03/29/2016 Hospital Admission    Pt was admitted for sepsis from pneumonia, and severe pancytopenia. She required ICU stay for a few days due to hypotension, developed b/l pleural effusion required diuretics and b/l thoracentesis. She also required blood and plt transfusion, and prolonged neupogen injection for severe neutropenia. She was discharged home after 3 weeks hospital stay     04/26/2016 -  Chemotherapy    Daratumumab restarted on 04/26/2016, Velcade 1.358mm2 and dexa 2036meekly start on 11/3, velcade held since 06/28/2016 due to CHF, dexa stopped on12/11/2017 due to high risk of fracture     08/26/2016 Echocardiogram    - Left ventricle: LVEF is approximately 35% with diffuse diffuse   hypokinesis with severe hypokinesis/ akinesis of the   inferior/inferoseptal walls. In comparison to echo images from   December 2017, there does not appear to be significant change The   cavity size was normal. Wall thickness was normal. Doppler   parameters are consistent with abnormal left ventricular   relaxation (grade 1 diastolic dysfunction). - Aortic valve: AV is thickened. 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 Present in echo from December 2017 There was   trivial regurgitation. - Right ventricle: Systolic function was mildly reduced. - Right atrium: The atrium was mildly dilated. - Pericardium, extracardiac: A trivial pericardial effusion was   identified.    12/03/2016 Echocardiogram    -Left Ventricle: Systolic function was   mildly to moderately reduced. The estimated ejection fraction was   in the range of 40% to 45%. Diffuse hypokinesis. Doppler   parameters are consistent with abnormal left ventricular   relaxation (grade 1 diastolic dysfunction). Doppler parameters   are consistent with indeterminate ventricular filling pressure. - Left atrium: The atrium was  severely dilated. - Tricuspid valve: There was trivial regurgitation. - Pulmonary arteries: Systolic pressure was within the normal   range. PA peak pressure: 33 mm Hg (S). - Global longitudinal strain -10.5% (abnormal)    03/12/2017 Echocardiogram    ECHO 03/12/17 Study Conclusions - Left ventricle: The cavity size was normal. Wall thickness was   increased in a pattern of mild LVH. Systolic function was normal.   The estimated ejection fraction was in the range of 50% to 55%.   There is hypokinesis of the basalinferior myocardium. Doppler   parameters are consistent with abnormal left ventricular   relaxation (grade 1 diastolic dysfunction). - Aortic valve: There was trivial regurgitation. - Mitral valve: Calcified annulus. Mildly thickened leaflets .   There was trivial regurgitation. - Tricuspid valve: There was mild regurgitation. Impressions: - There has been mild improvement in EF since prior study.     12/12/2017 - 12/15/2017 Hospital Admission    Admission diagnosis: Pnuemonia and acute hypoxic respiratory failure Additional comments: treated for pneumoania and acute hypoxic respiratory  failure before she was discharged on 12/15/17 with oral anitbiotics. She did not require home oxygen.     12/30/2017 - 01/02/2018 Hospital Admission    Admission diagnosis: Pnuemonia  Additional comments: She was again hospitalized on 12/30/17 for pneumonia, ID work up was otherwise negative, she was discharged home with oral antibiotics.     04/01/2018 Imaging    04/01/2018 CXR IMPRESSION: Persisting airspace disease superimposed on chronic changes, suggesting pneumonia given the history. Followup PA and lateral chest X-ray is recommended in 3-4 weeks following therapy to ensure resolution and exclude underlying malignancy.  Redemonstration of mid and lower thoracic compression fractures with associated thoracic kyphosis.    04/23/2018 Echocardiogram    04/23/2018 ECHO LV EF: 45% -    50%     CURRENT THERAPY  Daratumumabevery 3 weeks. Dexamethasone a day before treatment. Dexa stopped in 1.2020 due to well controlled MM and high risk of fracture   INTERVAL HISTORY: Shannon Rush is a 78 y.o. female who is here for follow-up. Today, she is here with her husband and was seen in the infusion room. She was seen by our symptom management clinic last week for fever and productive cough.  She has completed a course of antibiotics doxycycline 2 days ago.  She has not had a fever for the past week, still has mild residual productive cough and uses a cough medicine at night that helps her sleep. She does not use any cough medicine during the day. She describes the phlegm "greenish". Denies lower extremity swelling, pains or nausea.  Her appetite and energy level has improved, but still diffuse fatigue.  In the morning she wakes up with a sore throat and denies a fever. Last week she did have a fever.    Pertinent positives and negatives of review of systems are listed and detailed within the above HPI.  REVIEW OF SYSTEMS:   Constitutional: Denies fevers, chills or abnormal weight loss Eyes: Denies blurriness of vision Ears, nose, mouth, throat, and face: Denies mucositis or sore throat Respiratory: Denies dyspnea or wheezes, (+) productive cough  Cardiovascular: Denies palpitation, chest discomfort or lower extremity swelling Gastrointestinal:  Denies nausea, heartburn or change in bowel habits Skin: Denies abnormal skin rashes Lymphatics: Denies new lymphadenopathy or easy bruising Neurological:Denies numbness, tingling or new weaknesses Behavioral/Psych: Mood is stable, no new changes  All other systems were reviewed with the patient and are negative.  MEDICAL HISTORY:  Past Medical History:  Diagnosis Date  . CAD (coronary artery disease)    a. inf STEMI (in [place] >>> [facility]) >>> LHC (8/15- [place]):  pRCA 60%, mid to dist RCA 50% >>> med rx  .  Cardiomyopathy ([place])   . Chronic combined systolic and diastolic CHF (congestive heart failure) ([place])   . Cord compression ([place]) 07/13/12   MRI- diffuse myeloma involvement of T-L spine  . History of radiation therapy 07/13/12-07/27/12   spinal cord compression T3-T10,left scapula  . Hx of echocardiogram    Echo (03/02/14-done at [facility]):  Normal LVEF without significant regional wall motion abnormalities. Mild   . Multiple myeloma ([place]) 07/01/2012  . MVP (mitral valve prolapse)    a. trivial by echo 06/2017.  . OSTEOPOROSIS 06/11/2010   Multiple compression fractures; and spontaneous fracture of sternum Qualifier: Diagnosis of  By: [name], [name]     . Thoracic kyphosis 07/13/12   per MRI scan  . Unspecified deficiency anemia     SURGICAL  HISTORY: Past Surgical History:  Procedure Laterality Date  . APPENDECTOMY    . CESAREAN SECTION     x2   . COLONOSCOPY  2007   neg with Dr. [name]  . ELBOW SURGERY    . HIP SURGERY  2009   left  . TUBAL LIGATION      I have reviewed the social history and family history with the patient and they are unchanged from previous note.  ALLERGIES:  is allergic to zithromax [azithromycin]; zosyn [piperacillin sod-tazobactam so]; and quinolones.  MEDICATIONS:  Current Outpatient Medications  Medication Sig Dispense Refill  . acetaminophen (TYLENOL) 500 MG tablet Take 500 mg by mouth every 6 (six) hours as needed for moderate pain or fever.    Marland Kitchen acyclovir (ZOVIRAX) 400 MG tablet Take 1 tablet (400 mg total) by mouth 2 (two) times daily. 60 tablet 11  . albuterol (PROVENTIL) (2.5 MG/3ML) 0.083% nebulizer solution Take 3 mLs (2.5 mg total) by nebulization every 6 (six) hours as needed for wheezing or shortness of breath. 75 mL 12  . aspirin EC 81 MG tablet Take 1 tablet (81 mg total) by mouth daily. 90 tablet 3  . atorvastatin (LIPITOR) 20 MG tablet Take 1 tablet (20 mg total) by mouth daily. 90 tablet 2  . B Complex-C  (SUPER B COMPLEX PO) Take 1 tablet by mouth daily.    . Calcium Carbonate-Vit D-Min (CALCIUM 600+D PLUS MINERALS) 600-400 MG-UNIT TABS Take by mouth.    . doxycycline (VIBRA-TABS) 100 MG tablet Take 1 tablet (100 mg total) by mouth 2 (two) times daily. (Patient not taking: Reported on 08/18/2018) 14 tablet 0  . doxycycline (VIBRA-TABS) 100 MG tablet Take 1 tablet (100 mg total) by mouth 2 (two) times daily. 14 tablet 0  . guaiFENesin-codeine 100-10 MG/5ML syrup Take 5 mLs by mouth every 6 (six) hours as needed for cough. 120 mL 0  . metoprolol succinate (TOPROL-XL) 25 MG 24 hr tablet Take 1 tablet (25 mg total) by mouth daily. 90 tablet 2  . polyethylene glycol (MIRALAX) packet Take 17 g by mouth daily as needed. 14 each 0  . predniSONE (DELTASONE) 5 MG tablet 6 tab x 1 day, 5 tab x 1 day, 4 tab x 1 day, 3 tab x 1 day, 2 tab x 1 day, 1 tab x 1 day, stop 21 tablet 0   No current facility-administered medications for this visit.     PHYSICAL EXAMINATION: ECOG PERFORMANCE STATUS: 1 - Symptomatic but completely ambulatory   Vitals with BMI 08/27/2018  Height   Weight 119 lbs 8 oz  BMI 53.97  Systolic 673  Diastolic 74  Pulse 78  Respirations 17   GENERAL:alert, no distress and comfortable SKIN: skin color, texture, turgor are normal, no rashes or significant lesions EYES: normal, Conjunctiva are pink and non-injected, sclera clear OROPHARYNX:no exudate, no erythema and lips, buccal mucosa, and tongue normal  NECK: supple, thyroid normal size, non-tender, without nodularity LYMPH:  no palpable lymphadenopathy in the cervical, axillary or inguinal LUNGS: clear to normal breathing effort, (+) crackles on left lung  HEART: regular rate & rhythm and no murmurs and no lower extremity edema ABDOMEN:abdomen soft, non-tender and normal bowel sounds Musculoskeletal:no cyanosis of digits and no clubbing  NEURO: alert & oriented x 3 with fluent speech, no focal motor/sensory deficits  LABORATORY  DATA:  I have reviewed the data as listed CBC Latest Ref Rng & Units 08/27/2018 08/18/2018 08/06/2018  WBC 4.0 - 10.5 K/uL 4.7 5.4  2.4(L)  Hemoglobin 12.0 - 15.0 g/dL 11.3(L) 11.8(L) 12.0  Hematocrit 36.0 - 46.0 % 34.3(L) 35.3(L) 35.6(L)  Platelets 150 - 400 K/uL 182 118(L) 167     CMP Latest Ref Rng & Units 08/27/2018 08/18/2018 08/06/2018  Glucose 70 - 99 mg/dL 78 133(H) 232(H)  BUN 8 - 23 mg/dL 20 14 29(H)  Creatinine 0.44 - 1.00 mg/dL 0.80 0.93 1.13(H)  Sodium 135 - 145 mmol/L 140 135 140  Potassium 3.5 - 5.1 mmol/L 4.0 3.5 4.0  Chloride 98 - 111 mmol/L 105 99 107  CO2 22 - 32 mmol/L 27 27 21(L)  Calcium 8.9 - 10.3 mg/dL 9.0 9.2 9.5  Total Protein 6.5 - 8.1 g/dL 6.0(L) 6.7 6.6  Total Bilirubin 0.3 - 1.2 mg/dL 0.6 1.2 0.5  Alkaline Phos 38 - 126 U/L 71 78 64  AST 15 - 41 U/L 18 32 24  ALT 0 - 44 U/L 16 33 23      RADIOGRAPHIC STUDIES: I have personally reviewed the radiological images as listed and agreed with the findings in the report. No results found.   ASSESSMENT & PLAN:  RIELY OETKEN is a 78 y.o. female with history of  1. Recent acute bronchitis -She was seen by our symptom management clinic on August 24, 2018 for fever and productive cough, chest x-ray was unremarkable.  She was treated with doxycycline, which she has completed 2 days ago. -She still has some residual productive cough, exam showed bilateral crackles at the lung bases, especially on the right. -Due to her immunocompromised status, and history of multiple pneumonia required hospitalization, I will hold on chemotherapy today, and let her recover better. -Continue cough syrup as needed.  I encouraged her to use Mucinex.  2. IgG kappa Multiple myeloma s/p Auto SCT, intermediate risk, relapsed in 01/2016 -Diagnosed in 05/2012. Treated with induction chemo, radiation and BM transplant, but unfortunately progressed while on maintenance Revlimid 01/2016. She is currently on Daratumumabinfusion every 3 weeks  monotherapy -I have stopped her oral steroids due to her high risk of fracture, and well controlled MM  -Her recent M-protein has been 0.1-0.2, still positive on Dara-specific IFE  -Due to her recent acute bronchitis, I will hold on daratumumab for today, and postpone to next week.  Continue treatment every 3 weeks.  3. Pancytopenia  -Secondary to [place]. -stable overall   4. CHF, CAD, inferior STEMI in 02/2014 -f/u with Dr. Meda Coffee and continue medications -stable   5. Osteoporosis with multiple fractures  -Last DEXA  in 2015 showed worsening osteoporosis. She declined repeat DEXA. -She had a fall induced fracture in 01/2018. -She was on Zometa for 4 years, stopped after April 2018  6. Peripheral neuropathy in feet, G1-2 -Secondary to previous chemo. Declined Gabapentin previously. -will monitor  7. Goal of care discussion  -she is full code now   Plan  - Hold chemotherapy today  -Lab and daratumumab in one week (fast infusion)  -Lab,F/u and chemotherapy on 09/24/18   No problem-specific Assessment & Plan notes found for this encounter.   No orders of the defined types were placed in this encounter.  All questions were answered. The patient knows to call the clinic with any problems, questions or concerns. No barriers to learning was detected. I spent 20 minutes counseling the patient face to face. The total time spent in the appointment was 25 minutes and more than 50% was on counseling and review of test results  I, Manson Allan am acting as scribe  for Dr. [name].  I have reviewed the above documentation for accuracy and completeness, and I agree with the above.     [name], MD 08/27/2018

## 2018-08-28 ENCOUNTER — Telehealth: Payer: Self-pay | Admitting: Hematology

## 2018-08-28 NOTE — Telephone Encounter (Signed)
Called and spoke with patient spouse, scheduled appt per 2/13 los.  Patient aware of appt date and time.

## 2018-08-29 ENCOUNTER — Encounter: Payer: Self-pay | Admitting: Hematology

## 2018-08-29 DIAGNOSIS — J209 Acute bronchitis, unspecified: Secondary | ICD-10-CM | POA: Insufficient documentation

## 2018-09-01 NOTE — Progress Notes (Signed)
Cardiology Office Note    Date:  09/02/2018  ID:  Shannon Rush, DOB [birth year], MRN [id number] PCP:  Shannon Resides, MD  Cardiologist:  Ena Dawley, MD   Chief Complaint: f/u cardiomyopathy, CAD  History of Present Illness:  Shannon Rush is a 78 y.o. female with history of multiple myeloma s/p bone marrow transplant (followed at [facility] by Dr. [name] and in [facility] by Dr. Burr Medico), osteoporosis, HLD, CAD (inferior STEMI s/p thrombolytics 02/2014), chronic combined CHF (?chemo induced) who presents to clinic for post hospital follow up.  She was originally seen by Dr. Johnsie Cancel in 12/2012 for abnormal echo. Notes from Care Everywhere at [facility] indicate TEE demonstrated what appeared to be a Lambl's excrescence. She was admitted 02/2014 to the [facility] with an inferior STEMI. She received thrombolytics. Cardiac catheterization demonstrated 60% proximal RCA, mid to distal RCA 50% (lesion segmental and thrombotic). Echocardiogram demonstrated normal LV function. No intervention was performed. Recommendation was for the patient to return to [facility] and have a nuclear stress test in 6 weeks. I do not see this was ordered after she reestablished care in our practice with Dr. Meda Coffee. Patient was on Revlimid (for pancytopenia from chemotherapy) and the DC summary from the hospital in [place] reportedly cited this as a possible cause for her STEMI. She has a known hx of multiple myeloma since 2013 treated with Revlimid, subsquently switched to pomalidomide, dexamethasone, daratumumab with resultant pancytopenia. She was admitted from 02/2016 for sepsis and acute respiratory failure 2/2 HCAP with acute on chronic systolic CHF with pleural effusions. The pt was shocky and required IVF, transfer to ICU, and IV pressors. She was noted to be thrombocytopenic and her Plavix was stopped. She had persistent hypoxia.Chest CT 03/24/16 showed moderate bilateral effusions. She was  given Lasix but this was later stopped secondary to hypotension.She required bilateral thoracocentesis, IV diuresis and blood transfusions with symptomatic improvement. She was discharged on 13m Lasix PRN. She was admitted with bilateral pneumonia in 12/2017 and sustained a pelvic fracture in 01/2018. Dr. NMeda Coffeehas recommended q3 month echocardiograms while on chemotherapy. She was on Velcade and Zometa at one point. Regimen was eventually changed to Daratumumab/Dexamathasone. Dexamethasone was stopped in 07/2018 due to controlled MM and risk of fracture. 2D echoes continue to be followed q3 months while on chemotherapy. Last one 07/2018 showed EF 45-50%, moderate hypokinesis of the basiliar-mid inferior left ventricular segment, mildly enlarged RV, mildly dilated RA size, mild TR, mild calcficiation of AV, and Lambl's excrescence. Dr. NMeda Coffeereviewed this and felt it was stable. Last labs 08/2018 showed K 4.0, Cr 0.80, albumin 3.3, Hgb 11.3, 03/2018 showed LDL 57. Dr. NMeda Coffeehas previously recommended to reduce atorvastatin to 245mdaily and this level reflects that.  Ms. MoCiolinoeturns for follow-up today alone. As usual, she rode her bike here and had no functional limitation doing so. No CP or SOB. No edema, orthopnea, PND. She has had a mild residual cough since a URI was treated a few weeks ago. She continues to follow regularly with oncology and otherwise has no acute complaints. No wheezing. [place] this [place].   Past Medical History:  Diagnosis Date  . CAD (coronary artery disease)    [place]  pRCA 60%, mid to dist RCA 50% >>> med rx  . [place]  . Chronic combined  systolic and diastolic CHF (congestive heart failure) ([place])   . Cord compression ([place]) 07/13/12   MRI- diffuse myeloma involvement of T-L spine  . History of radiation therapy 07/13/12-07/27/12   spinal cord  compression T3-T10,left scapula  . Hx of echocardiogram    Echo (03/02/14-done at [facility]):  Normal LVEF without significant regional wall motion abnormalities. Mild   . Multiple myeloma ([place]) 07/01/2012  . MVP (mitral valve prolapse)    a. trivial by echo 06/2017.  . OSTEOPOROSIS 06/11/2010   Multiple compression fractures; and spontaneous fracture of sternum Qualifier: Diagnosis of  By: [name], [name]     . Thoracic kyphosis 07/13/12   per MRI scan  . Unspecified deficiency anemia     Past Surgical History:  Procedure Laterality Date  . APPENDECTOMY    . CESAREAN SECTION     x2   . COLONOSCOPY  2007   neg with Dr. [name]  . ELBOW SURGERY    . HIP SURGERY  2009   left  . TUBAL LIGATION      Current Medications: Current Meds  Medication Sig  . acetaminophen (TYLENOL) 500 MG tablet Take 500 mg by mouth every 6 (six) hours as needed for moderate pain or fever.  Marland Kitchen acyclovir (ZOVIRAX) 400 MG tablet Take 1 tablet (400 mg total) by mouth 2 (two) times daily.  Marland Kitchen albuterol (PROVENTIL) (2.5 MG/3ML) 0.083% nebulizer solution Take 3 mLs (2.5 mg total) by nebulization every 6 (six) hours as needed for wheezing or shortness of breath.  Marland Kitchen aspirin EC 81 MG tablet Take 1 tablet (81 mg total) by mouth daily.  Marland Kitchen atorvastatin (LIPITOR) 20 MG tablet Take 1 tablet (20 mg total) by mouth daily.  . Calcium Carbonate-Vit D-Min (CALCIUM 600+D PLUS MINERALS) 600-400 MG-UNIT TABS Take by mouth.  . doxycycline (VIBRA-TABS) 100 MG tablet Take 100 mg by mouth 2 (two) times daily as needed.  . metoprolol succinate (TOPROL-XL) 25 MG 24 hr tablet Take 1 tablet (25 mg total) by mouth daily.  . [DISCONTINUED] polyethylene glycol (MIRALAX) packet Take 17 g by mouth daily as needed.      Allergies:   Zithromax [azithromycin]; Zosyn [piperacillin sod-tazobactam so]; and Quinolones   Social History   Socioeconomic History  . Marital status: Married    Spouse name: Fritz Pickerel  .  Number of children: 2  . Years of education: Not on file  . Highest education level: Not on file  Occupational History  . Occupation: Museum/gallery conservator: [employer]  . Financial resource strain: Not on file  . Food insecurity:    Worry: Not on file    Inability: Not on file  . Transportation needs:    Medical: Not on file    Non-medical: Not on file  Tobacco Use  . Smoking status: Never Smoker  . Smokeless tobacco: Never Used  Substance and Sexual Activity  . Alcohol use: Yes    Alcohol/week: 7.0 standard drinks    Types: 7 Standard drinks or equivalent per week    Comment: 1 glass of wine or beer 3-5 days per week  . Drug use: No  . Sexual activity: Yes  Lifestyle  . Physical activity:    Days per week: Not on file    Minutes per session: Not on file  . Stress: Not on file  Relationships  . Social connections:    Talks on phone: Not on file    Gets together:  Not on file    Attends religious service: Not on file    Active member of club or organization: Not on file    Attends meetings of clubs or organizations: Not on file    Relationship status: Not on file  Other Topics Concern  . Not on file  Social History Narrative   Health Care POA:    Emergency Contact: spouse, Dereonna Lensing [phone]   End of Life Plan:    Who lives with you: spouse, 2 story home   Any pets: Dog, "AKU"   Diet: Patient has a varied diet of protein, vegetables, starches.  Limits red meats.   Exercise: Patient exercises atleast 3 times a week for over an hour.   Seatbelts: Patient reports wearing her seatbelt when in vehicle.    Nancy Fetter Exposure/Protection: Patient reports wearing sunscreen daily.   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 [place], RN, BSN                                                                                                         Family History:  The patient's family history includes Cancer in her brother; Glaucoma in her father and mother; Heart disease in her father and mother; Heart failure in her father; Peripheral vascular disease in her mother; Raynaud syndrome in her daughter, daughter, and mother.  ROS:   Please see the history of present illness.  All other systems are reviewed and otherwise negative.    PHYSICAL EXAM:   VS:  BP 108/68   Pulse 82   Ht 5' 7"  (1.702 m)   Wt 121 lb 6.4 oz (55.1 kg)   SpO2 98%   BMI 19.01 kg/m   BMI: Body mass index is 19.01 kg/m. GEN: Thin WF, in no acute distress HEENT: normocephalic, atraumatic Neck: no JVD, carotid bruits, or masses Cardiac: RRR; no murmurs, rubs, or gallops, no edema  Respiratory:  clear to auscultation bilaterally, normal work of breathing GI: soft, nontender, nondistended, + BS MS: chest wall deformity which pt relates to prior rib fx Skin: warm and dry, no rash Neuro:  Alert and Oriented x 3, Strength and sensation are intact, follows commands Psych: euthymic mood, full affect  Wt Readings from Last 3 Encounters:  09/02/18 121 lb 6.4 oz (55.1 kg)  08/27/18 119 lb 8 oz (54.2 kg)  08/18/18 121 lb 3.2 oz (55 kg)      Studies/Labs Reviewed:   EKG:  EKG was ordered  today and personally reviewed by me and demonstrates NSR 88bpm, inferior TWI as well as V3-V6 similar to prior.  Recent Labs: 12/30/2017: B Natriuretic  Peptide 176.4 08/27/2018: ALT 16; BUN 20; Creatinine 0.80; Hemoglobin 11.3; Platelet Count 182; Potassium 4.0; Sodium 140   Lipid Panel    Component Value Date/Time   CHOL 116 04/07/2018 1249   CHOL 121 12/31/2016 1543   TRIG 86 04/07/2018 1249   HDL 42 04/07/2018 1249   HDL 62 12/31/2016 1543   CHOLHDL 2.8 04/07/2018 1249   VLDL 17 04/07/2018 1249   LDLCALC 57 04/07/2018 1249   LDLCALC 39 12/31/2016 1543    Additional studies/ records that were reviewed today include: Summarized above   ASSESSMENT & PLAN:   1. CAD - no recent anginal sx. Continue ASA, BB, statin. 2. Chronic combined CHF/cardiomyopathy - appears euvolemic. She is no longer on ACEI due to intermittent low BP. She is asymptomatic. Continue Toprol. Reviewed CHF symptoms and daily weights with patient. She has not really had any issue with fluid retention. Repeat echo has been recommended q3 months but the patient has been hopeful to stretch out to 3-4 months. She plans to schedule this for 11/2018. 3. Hyperlipidemia - LDL well controlled 03/2018. At next f/u in August would suggest arranging repeat lipids. Tolerating atorvastatin. 4. Multiple myeloma - tolerating chemo without acute symptoms, followed very closely by oncology.  Disposition: F/u with Dr. Meda Coffee in 6 months.  Medication Adjustments/Labs and Tests Ordered: Current medicines are reviewed at length with the patient today.  Concerns regarding medicines are outlined above. Medication changes, Labs and Tests ordered today are summarized above and listed in the Patient Instructions accessible in Encounters.   Signed, Charlie Pitter, PA-C  09/02/2018 10:01 AM    [facility] [address], [place], [place]  [address] Phone: [phone]; Fax: [phone]

## 2018-09-02 ENCOUNTER — Ambulatory Visit: Payer: Medicare Other | Admitting: Physician Assistant

## 2018-09-02 ENCOUNTER — Encounter: Payer: Self-pay | Admitting: Physician Assistant

## 2018-09-02 VITALS — BP 108/68 | HR 82 | Ht 67.0 in | Wt 121.4 lb

## 2018-09-02 DIAGNOSIS — I2510 Atherosclerotic heart disease of native coronary artery without angina pectoris: Secondary | ICD-10-CM | POA: Diagnosis not present

## 2018-09-02 DIAGNOSIS — C9000 Multiple myeloma not having achieved remission: Secondary | ICD-10-CM

## 2018-09-02 DIAGNOSIS — E785 Hyperlipidemia, unspecified: Secondary | ICD-10-CM

## 2018-09-02 DIAGNOSIS — I5042 Chronic combined systolic (congestive) and diastolic (congestive) heart failure: Secondary | ICD-10-CM | POA: Diagnosis not present

## 2018-09-02 NOTE — Patient Instructions (Addendum)
Medication Instructions:  Your physician recommends that you continue on your current medications as directed. Please refer to the Current Medication list given to you today.   If you need a refill on your cardiac medications before your next appointment, please call your pharmacy.   Lab work: None ordered  If you have labs (blood work) drawn today and your tests are completely normal, you will receive your results only by: Marland Kitchen MyChart Message (if you have MyChart) OR . A paper copy in the mail If you have any lab test that is abnormal or we need to change your treatment, we will call you to review the results.  Testing/Procedures: Your physician has requested that you have an echocardiogram IN MAY 2020. Echocardiography is a painless test that uses sound waves to create images of your heart. It provides your doctor with information about the size and shape of your heart and how well your heart's chambers and valves are working. This procedure takes approximately one hour. There are no restrictions for this procedure.   Follow-Up: At [facility], you and your health needs are our priority.  As part of our continuing mission to provide you with exceptional heart care, we have created designated Provider Care Teams.  These Care Teams include your primary Cardiologist (physician) and Advanced Practice Providers (APPs -  Physician Assistants and Nurse Practitioners) who all work together to provide you with the care you need, when you need it. You will need a follow up appointment in 6 months.  Please call our office 2 months in advance to schedule this appointment.  You may see Ena Dawley, MD or one of the following Advanced Practice Providers on your designated Care Team:   [place], PA-C Melina Copa, PA-C . Ermalinda Barrios, PA-C  Any Other Special Instructions Will Be Listed Below (If Applicable).

## 2018-09-03 ENCOUNTER — Inpatient Hospital Stay: Payer: Medicare Other

## 2018-09-03 VITALS — BP 123/72 | HR 70 | Temp 98.2°F | Resp 18

## 2018-09-03 DIAGNOSIS — I252 Old myocardial infarction: Secondary | ICD-10-CM | POA: Diagnosis not present

## 2018-09-03 DIAGNOSIS — M810 Age-related osteoporosis without current pathological fracture: Secondary | ICD-10-CM | POA: Diagnosis not present

## 2018-09-03 DIAGNOSIS — C9002 Multiple myeloma in relapse: Secondary | ICD-10-CM | POA: Diagnosis not present

## 2018-09-03 DIAGNOSIS — Z923 Personal history of irradiation: Secondary | ICD-10-CM | POA: Diagnosis not present

## 2018-09-03 DIAGNOSIS — Z79899 Other long term (current) drug therapy: Secondary | ICD-10-CM | POA: Diagnosis not present

## 2018-09-03 DIAGNOSIS — Z5112 Encounter for antineoplastic immunotherapy: Secondary | ICD-10-CM | POA: Diagnosis not present

## 2018-09-03 DIAGNOSIS — Z9221 Personal history of antineoplastic chemotherapy: Secondary | ICD-10-CM | POA: Diagnosis not present

## 2018-09-03 DIAGNOSIS — D61818 Other pancytopenia: Secondary | ICD-10-CM | POA: Diagnosis not present

## 2018-09-03 DIAGNOSIS — C9000 Multiple myeloma not having achieved remission: Secondary | ICD-10-CM

## 2018-09-03 DIAGNOSIS — Z7982 Long term (current) use of aspirin: Secondary | ICD-10-CM | POA: Diagnosis not present

## 2018-09-03 DIAGNOSIS — J209 Acute bronchitis, unspecified: Secondary | ICD-10-CM | POA: Diagnosis not present

## 2018-09-03 DIAGNOSIS — G629 Polyneuropathy, unspecified: Secondary | ICD-10-CM | POA: Diagnosis not present

## 2018-09-03 LAB — CMP (CANCER CENTER ONLY)
ALT: 12 U/L (ref 0–44)
AST: 19 U/L (ref 15–41)
Albumin: 3.7 g/dL (ref 3.5–5.0)
Alkaline Phosphatase: 70 U/L (ref 38–126)
Anion gap: 14 (ref 5–15)
BUN: 18 mg/dL (ref 8–23)
CO2: 24 mmol/L (ref 22–32)
Calcium: 9.4 mg/dL (ref 8.9–10.3)
Chloride: 105 mmol/L (ref 98–111)
Creatinine: 0.87 mg/dL (ref 0.44–1.00)
GFR, Est AFR Am: >60 mL/min (ref >60)
GFR, Estimated: >60 mL/min (ref >60)
Glucose, Bld: 92 mg/dL (ref 70–99)
Potassium: 3.8 mmol/L (ref 3.5–5.1)
Sodium: 143 mmol/L (ref 135–145)
Total Bilirubin: 0.6 mg/dL (ref 0.3–1.2)
Total Protein: 6.6 g/dL (ref 6.5–8.1)

## 2018-09-03 LAB — CBC WITH DIFFERENTIAL (CANCER CENTER ONLY)
Abs Immature Granulocytes: 0.02 10*3/uL (ref 0.00–0.07)
BASOS ABS: 0 10*3/uL (ref 0.0–0.1)
Basophils Relative: 1 %
Eosinophils Absolute: 0 10*3/uL (ref 0.0–0.5)
Eosinophils Relative: 1 %
HCT: 35.8 % — ABNORMAL LOW (ref 36.0–46.0)
Hemoglobin: 11.5 g/dL — ABNORMAL LOW (ref 12.0–15.0)
Immature Granulocytes: 0 %
Lymphocytes Relative: 15 %
Lymphs Abs: 0.8 10*3/uL (ref 0.7–4.0)
MCH: 34 pg (ref 26.0–34.0)
MCHC: 32.1 g/dL (ref 30.0–36.0)
MCV: 105.9 fL — ABNORMAL HIGH (ref 80.0–100.0)
Monocytes Absolute: 0.5 10*3/uL (ref 0.1–1.0)
Monocytes Relative: 9 %
NEUTROS PCT: 74 %
Neutro Abs: 4.1 10*3/uL (ref 1.7–7.7)
Platelet Count: 235 10*3/uL (ref 150–400)
RBC: 3.38 MIL/uL — ABNORMAL LOW (ref 3.87–5.11)
RDW: 15.5 % (ref 11.5–15.5)
WBC Count: 5.4 10*3/uL (ref 4.0–10.5)
nRBC: 0 % (ref 0.0–0.2)

## 2018-09-03 MED ORDER — METHYLPREDNISOLONE SODIUM SUCC 125 MG IJ SOLR
125.0000 mg | Freq: Once | INTRAMUSCULAR | Status: AC
  Administered 2018-09-03: 125 mg via INTRAVENOUS

## 2018-09-03 MED ORDER — ACETAMINOPHEN 325 MG PO TABS
ORAL_TABLET | ORAL | Status: AC
  Filled 2018-09-03: qty 2

## 2018-09-03 MED ORDER — MONTELUKAST SODIUM 10 MG PO TABS
ORAL_TABLET | ORAL | Status: AC
  Filled 2018-09-03: qty 1

## 2018-09-03 MED ORDER — ACETAMINOPHEN 325 MG PO TABS
650.0000 mg | ORAL_TABLET | Freq: Once | ORAL | Status: AC
  Administered 2018-09-03: 650 mg via ORAL

## 2018-09-03 MED ORDER — METHYLPREDNISOLONE SODIUM SUCC 125 MG IJ SOLR
INTRAMUSCULAR | Status: AC
  Filled 2018-09-03: qty 2

## 2018-09-03 MED ORDER — SODIUM CHLORIDE 0.9 % IV SOLN
16.0000 mg/kg | Freq: Once | INTRAVENOUS | Status: AC
  Administered 2018-09-03: 900 mg via INTRAVENOUS
  Filled 2018-09-03: qty 40

## 2018-09-03 MED ORDER — SODIUM CHLORIDE 0.9 % IV SOLN
Freq: Once | INTRAVENOUS | Status: AC
  Administered 2018-09-03: 15:00:00 via INTRAVENOUS
  Filled 2018-09-03: qty 250

## 2018-09-03 MED ORDER — MONTELUKAST SODIUM 10 MG PO TABS
10.0000 mg | ORAL_TABLET | Freq: Once | ORAL | Status: AC
  Administered 2018-09-03: 10 mg via ORAL

## 2018-09-03 MED ORDER — DIPHENHYDRAMINE HCL 25 MG PO CAPS
50.0000 mg | ORAL_CAPSULE | Freq: Once | ORAL | Status: AC
  Administered 2018-09-03: 50 mg via ORAL

## 2018-09-03 MED ORDER — DIPHENHYDRAMINE HCL 25 MG PO CAPS
ORAL_CAPSULE | ORAL | Status: AC
  Filled 2018-09-03: qty 2

## 2018-09-03 NOTE — Patient Instructions (Signed)
[facility] Discharge Instructions for Patients Receiving Chemotherapy  Today you received the following chemotherapy agents: Darzalex  To help prevent nausea and vomiting after your treatment, we encourage you to take your nausea medication as directed.    If you develop nausea and vomiting that is not controlled by your nausea medication, call the clinic.   BELOW ARE SYMPTOMS THAT SHOULD BE REPORTED IMMEDIATELY:  *FEVER GREATER THAN 100.5 F  *CHILLS WITH OR WITHOUT FEVER  NAUSEA AND VOMITING THAT IS NOT CONTROLLED WITH YOUR NAUSEA MEDICATION  *UNUSUAL SHORTNESS OF BREATH  *UNUSUAL BRUISING OR BLEEDING  TENDERNESS IN MOUTH AND THROAT WITH OR WITHOUT PRESENCE OF ULCERS  *URINARY PROBLEMS  *BOWEL PROBLEMS  UNUSUAL RASH Items with * indicate a potential emergency and should be followed up as soon as possible.  Feel free to call the clinic should you have any questions or concerns. The clinic phone number is [phone].  Please show the CHEMO ALERT CARD at check-in to the Emergency Department and triage nurse.   

## 2018-09-11 ENCOUNTER — Encounter

## 2018-09-18 ENCOUNTER — Ambulatory Visit: Payer: Medicare Other | Admitting: Cardiology

## 2018-09-22 NOTE — Progress Notes (Signed)
[place]   Telephone:([phone]) [phone] Fax:(336) [phone]   Clinic Follow up Note   Patient Care Team: [name], MD as PCP - General (Family Medicine) [name], MD as Attending Physician (Obstetrics and Gynecology) [name], MD as Consulting Physician (Internal Medicine) [name], MD as Consulting Physician (Hematology) [name], MD as Consulting Physician (Ophthalmology) [name], MD as Consulting Physician (Cardiology) [name], MD as Consulting Physician (Dermatology) [name] (Dentistry) [name], DPM as Consulting Physician (Podiatry) 09/24/2018  CHIEF COMPLAINT: F/u on MM  SUMMARY OF ONCOLOGIC HISTORY: Oncology History   Multiple myeloma [facility])   Staging form: Multiple Myeloma, AJCC 6th Edition   - Clinical: Stage IIIA - Signed by [name], MD on 09/04/2013      Multiple myeloma ([address])   05/26/2012 Initial Diagnosis    Presenting IgG was 4,040 mg/dL on 06/05/2012 (IgA 35; IgM 34); kappa free light chain 34.4 mg/dL, lambda 0.00, kappa:lambda ratio of 34.75; SPEP with M-spike of 2.60.    06/30/2012 Imaging    Numerous lytic lesions throughout the calvarium.  Lytic lesion within the left lateral scapula.  Findings most compatible with metastases or myeloma. Multiple mid and lower thoracic compression fractures.  Slight compression through the endplates at L3.    44/97/5300 Bone Marrow Biopsy    Bone marrow biopsy showed 49% plasma cell. Normal classical cytogenetics; however, myeloma FISH panel showed 13q- (intermediate risk)        07/14/2012 - 07/27/2012 Radiation Therapy    Palliative radiation 20 Gy over 10 fractions between to thoracic spine cord compression and symptomatic left scapula lytic lesion.    08/10/2012 - 11/2012 Chemotherapy    Started SQ Velcade once weekly, 3 weeks on, 1 weeks off; daily Revlimid d1-21, 7 days off; and Dexamethasone 13m PO weekly.     02/12/2013 Bone  Marrow Transplant    [facility]     06/14/2013 Tumor Marker    IgG  1830,  Kappa:lamba ratio, 1.69 (baseline was 34.75 or   M-spike 0.28 (baseline of 2.6 or  89.3% of baseline).      06/25/2013 -  Chemotherapy    Started zometa 3.5 mg monthly     09/01/2013 - 01/2014 Chemotherapy    Maintenance therapy with revlimid 548mdaily for 14 days and then off for 7 (decreased from 21 days on and 7 days off based on neutropenia). Stopped due to disease progression 01/2017    09/13/2013 - 09/17/2014 Hospital Admission    Hospital admission for pneumonia    12/20/2013 Treatment Plan Change    Maintenance therapy decreased to 2.5 mg daily for 21 days and then off for 7 days based on low counts.     01/25/2014 Tumor Marker    IgG 1360 M spike 0.5    03/01/2014 - 03/05/2014 Hospital Admission    [facility], received thrombolytic therapy, cath showed 60% prox RCA, mid-distal RCA 50%, Echo normal, no stents.    04/11/2014 - 08/07/2014 Chemotherapy    Continue Revlimid at 2.5 mg 3 weeks on 1 week off    08/08/2014 - 08/13/2014 Hospital Admission    Hospital admission for pneumonia. Her Revlimid was held.    08/29/2014 - 09/14/2014 Chemotherapy    Maintenance Revlimid restarted    09/14/2014 - 09/17/2014 Hospital Admission    [place].  11/13/2014 - 12/25/2015 Chemotherapy    Maintenance Revlimid restarted, changed to 2.24m daily, 2 weks on, 1 week off from 12/15/2014, stopped due to disease progression     01/24/2016 Progression    Patient is M protein has gradually increased to 1.0g, repeated a bone marrow biopsy showed plasma cell 10-30%    02/27/2016 - 03/07/2016 Chemotherapy    Pomalidomide 4 mg daily on day 1-21, dexamethasone 40 mg on day 1, 8 and 15, every 28 days, started on 02/27/2016, daratumumab weekly started on 03/07/2016, held after first dose,  due to hospitalization and a severe pancytopenia, pomalidomide was stopped afterwards     03/10/2016 - 03/29/2016 Hospital Admission    Pt was admitted for sepsis from pneumonia, and severe pancytopenia. She required ICU stay for a few days due to hypotension, developed b/l pleural effusion required diuretics and b/l thoracentesis. She also required blood and plt transfusion, and prolonged neupogen injection for severe neutropenia. She was discharged home after 3 weeks hospital stay     04/26/2016 -  Chemotherapy    Daratumumab restarted on 04/26/2016, Velcade 1.383mm2 and dexa 2044meekly start on 11/3, velcade held since 06/28/2016 due to CHF, dexa stopped on12/11/2017 due to high risk of fracture     08/26/2016 Echocardiogram    - Left ventricle: LVEF is approximately 35% with diffuse diffuse   hypokinesis with severe hypokinesis/ akinesis of the   inferior/inferoseptal walls. In comparison to echo images from   December 2017, there does not appear to be significant change The   cavity size was normal. Wall thickness was normal. Doppler   parameters are consistent with abnormal left ventricular   relaxation (grade 1 diastolic dysfunction). - Aortic valve: AV is thickened. 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 Present in echo from December 2017 There was   trivial regurgitation. - Right ventricle: Systolic function was mildly reduced. - Right atrium: The atrium was mildly dilated. - Pericardium, extracardiac: A trivial pericardial effusion was   identified.    12/03/2016 Echocardiogram    -Left Ventricle: Systolic function was   mildly to moderately reduced. The estimated ejection fraction was   in the range of 40% to 45%. Diffuse hypokinesis. Doppler   parameters are consistent with abnormal left ventricular   relaxation (grade 1 diastolic dysfunction). Doppler parameters   are consistent with indeterminate ventricular  filling pressure. - Left atrium: The atrium was severely dilated. - Tricuspid valve: There was trivial regurgitation. - Pulmonary arteries: Systolic pressure was within the normal   range. PA peak pressure: 33 mm Hg (S). - Global longitudinal strain -10.5% (abnormal)    03/12/2017 Echocardiogram    ECHO 03/12/17 Study Conclusions - Left ventricle: The cavity size was normal. Wall thickness was   increased in a pattern of mild LVH. Systolic function was normal.   The estimated ejection fraction was in the range of 50% to 55%.   There is hypokinesis of the basalinferior myocardium. Doppler   parameters are consistent with abnormal left ventricular   relaxation (grade 1 diastolic dysfunction). - Aortic valve: There was trivial regurgitation. - Mitral valve: Calcified annulus. Mildly thickened leaflets .   There was trivial regurgitation. - Tricuspid valve: There was mild regurgitation. Impressions: - There has been mild improvement in EF since prior study.     12/12/2017 - 12/15/2017 Hospital Admission    Admission diagnosis: Pnuemonia and acute hypoxic respiratory failure Additional comments: treated for pneumoania and acute  hypoxic respiratory failure before she was discharged on 12/15/17 with oral anitbiotics. She did not require home oxygen.     12/30/2017 - 01/02/2018 Hospital Admission    Admission diagnosis: Pnuemonia  Additional comments: She was again hospitalized on 12/30/17 for pneumonia, ID work up was otherwise negative, she was discharged home with oral antibiotics.     04/01/2018 Imaging    04/01/2018 CXR IMPRESSION: Persisting airspace disease superimposed on chronic changes, suggesting pneumonia given the history. Followup PA and lateral chest X-ray is recommended in 3-4 weeks following therapy to ensure resolution and exclude underlying malignancy.  Redemonstration of mid and lower thoracic compression fractures with associated thoracic kyphosis.    04/23/2018  Echocardiogram    04/23/2018 ECHO LV EF: 45% -   50%     CURRENT THERAPY  Daratumumabevery 3 weeks. Dexamethasone a day before treatment.Dexa stopped in 1.2020 due to well controlled MM and high risk of fracture  INTERVAL HISTORY: Shannon Rush is a 78 y.o. female who is here for follow-up. Today, she is here by herself today.  She just came back from a international trip to [name] yesterday, slightly fatigued, but otherwise feels well.  She has recovered well from her last episode of pneumonia, still has mild dry cough in the morning with minimal sputum production.  No fever, dyspnea, or chest discomfort.  Chronic back pain is stable, she is not on any pain medication.  Appetite and energy level are decent, functions well at home.  Pertinent positives and negatives of review of systems are listed and detailed within the above HPI.  REVIEW OF SYSTEMS:   Constitutional: Denies fevers, chills or abnormal weight loss, (+) mild fatigue  Eyes: Denies blurriness of vision Ears, nose, mouth, throat, and face: Denies mucositis or sore throat Respiratory: (+) mild dry cough, no dyspnea or wheezes Cardiovascular: Denies palpitation, chest discomfort or lower extremity swelling Gastrointestinal:  Denies nausea, heartburn or change in bowel habits Skin: Denies abnormal skin rashes Lymphatics: Denies new lymphadenopathy or easy bruising Neurological:Denies numbness, tingling or new weaknesses Behavioral/Psych: Mood is stable, no new changes  All other systems were reviewed with the patient and are negative.  MEDICAL HISTORY:  Past Medical History:  Diagnosis Date  . CAD (coronary artery disease)    a. inf STEMI (in [place] >>> [facility]) >>> LHC (8/15- [place]):  pRCA 60%, mid to dist RCA 50% >>> med rx  . Cardiomyopathy ([place])   . Cataract   . Chronic combined systolic and diastolic CHF (congestive heart failure) ([place])   . Cord compression ([place]) 07/13/12   MRI-  diffuse myeloma involvement of T-L spine  . History of radiation therapy 07/13/12-07/27/12   spinal cord compression T3-T10,left scapula  . Hx of echocardiogram    Echo (03/02/14-done at [facility]):  Normal LVEF without significant regional wall motion abnormalities. Mild   . Multiple myeloma ([place]) 07/01/2012  . MVP (mitral valve prolapse)    a. trivial by echo 06/2017.  . OSTEOPOROSIS 06/11/2010   Multiple compression fractures; and spontaneous fracture of sternum Qualifier: Diagnosis of  By: [name], [name]     . Thoracic kyphosis 07/13/12   per MRI scan  . Unspecified deficiency anemia     SURGICAL HISTORY: Past Surgical History:  Procedure Laterality Date  . APPENDECTOMY    . CATARACT EXTRACTION, BILATERAL    . CESAREAN SECTION     x2   . COLONOSCOPY  2007   neg with  Dr. [name]  . ELBOW SURGERY    . HIP SURGERY  2009   left  . TUBAL LIGATION      I have reviewed the social history and family history with the patient and they are unchanged from previous note.  ALLERGIES:  is allergic to zithromax [azithromycin]; zosyn [piperacillin sod-tazobactam so]; and quinolones.  MEDICATIONS:  Current Outpatient Medications  Medication Sig Dispense Refill  . acetaminophen (TYLENOL) 500 MG tablet Take 500 mg by mouth every 6 (six) hours as needed for moderate pain or fever.    Marland Kitchen acyclovir (ZOVIRAX) 400 MG tablet Take 1 tablet (400 mg total) by mouth 2 (two) times daily. 60 tablet 11  . albuterol (PROVENTIL) (2.5 MG/3ML) 0.083% nebulizer solution Take 3 mLs (2.5 mg total) by nebulization every 6 (six) hours as needed for wheezing or shortness of breath. 75 mL 12  . aspirin EC 81 MG tablet Take 1 tablet (81 mg total) by mouth daily. 90 tablet 3  . atorvastatin (LIPITOR) 20 MG tablet Take 1 tablet (20 mg total) by mouth daily. 90 tablet 2  . Calcium Carbonate-Vit D-Min (CALCIUM 600+D PLUS MINERALS) 600-400 MG-UNIT TABS Take by mouth.    . doxycycline  (VIBRA-TABS) 100 MG tablet Take 100 mg by mouth 2 (two) times daily as needed.    . metoprolol succinate (TOPROL-XL) 25 MG 24 hr tablet Take 1 tablet (25 mg total) by mouth daily. 90 tablet 2   No current facility-administered medications for this visit.     PHYSICAL EXAMINATION: ECOG PERFORMANCE STATUS: 1 - Symptomatic but completely ambulatory  Vitals:   09/24/18 1057  BP: 135/74  Pulse: 86  Resp: 18  Temp: 97.6 F (36.4 C)  SpO2: 97%   Filed Weights   09/24/18 1057  Weight: 122 lb 1.6 oz (55.4 kg)    GENERAL:alert, no distress and comfortable SKIN: skin color, texture, turgor are normal, no rashes or significant lesions EYES: normal, Conjunctiva are pink and non-injected, sclera clear OROPHARYNX:no exudate, no erythema and lips, buccal mucosa, and tongue normal  NECK: supple, thyroid normal size, non-tender, without nodularity LYMPH:  no palpable lymphadenopathy in the cervical, axillary or inguinal LUNGS: clear to auscultation and percussion with normal breathing effort HEART: regular rate & rhythm and no murmurs and no lower extremity edema ABDOMEN:abdomen soft, non-tender and normal bowel sounds Musculoskeletal:no cyanosis of digits and no clubbing  NEURO: alert & oriented x 3 with fluent speech, no focal motor/sensory deficits  LABORATORY DATA:  I have reviewed the data as listed CBC Latest Ref Rng & Units 09/24/2018 09/03/2018 08/27/2018  WBC 4.0 - 10.5 K/uL 3.3(L) 5.4 4.7  Hemoglobin 12.0 - 15.0 g/dL 10.7(L) 11.5(L) 11.3(L)  Hematocrit 36.0 - 46.0 % 32.7(L) 35.8(L) 34.3(L)  Platelets 150 - 400 K/uL 167 235 182     CMP Latest Ref Rng & Units 09/03/2018 08/27/2018 08/18/2018  Glucose 70 - 99 mg/dL 92 78 133(H)  BUN 8 - 23 mg/dL 18 20 14   Creatinine 0.44 - 1.00 mg/dL 0.87 0.80 0.93  Sodium 135 - 145 mmol/L 143 140 135  Potassium 3.5 - 5.1 mmol/L 3.8 4.0 3.5  Chloride 98 - 111 mmol/L 105 105 99  CO2 22 - 32 mmol/L 24 27 27   Calcium 8.9 - 10.3 mg/dL 9.4 9.0 9.2    Total Protein 6.5 - 8.1 g/dL 6.6 6.0(L) 6.7  Total Bilirubin 0.3 - 1.2 mg/dL 0.6 0.6 1.2  Alkaline Phos 38 - 126 U/L 70 71 78  AST 15 - 41  U/L 19 18 32  ALT 0 - 44 U/L 12 16 33      RADIOGRAPHIC STUDIES: I have personally reviewed the radiological images as listed and agreed with the findings in the report. No results found.   ASSESSMENT & PLAN:  Shannon Rush is a 78 y.o. female with history of   2. IgG kappa Multiple myeloma s/p Auto SCT, intermediate risk, relapsed in 01/2016 -Diagnosed in 05/2012. Treated with induction chemo, radiation and BM transplant, but unfortunately progressed while on maintenance Revlimid 01/2016. She is currently onDaratumumabinfusion every 3 weeks monotherapy -Ihave stopped her oral dexa due to her high risk of fracture, and well controlled MM -Her recent M-protein has been 0.1-0.2, still positive on Dara-specific IFE with low level  -She has recovered well from her last episode of bronchitis, lab reviewed, adequate for treatment, will proceed with daratumumab today and continue every 3 weeks. -We discussed the risk of infection, particularly pulmonary infection, due to her compromised immune system and multiple infections in the past.  We discussed the precaution about travel, and being exposed to a crowd etc. She voiced good understanding    2. History of multiple pneumonia and bronchitis -She has had multiple episodes of pneumonia, required hospitalization, and bronchitis, required antibiotics -She has recovered well, will continue monitoring.  I encouraged her to do spirometry, and exercise. -Her vaccines are up-to-date.   3. Pancytopenia -Secondary to [place]. -stable overall   4. CHF, CAD, inferior STEMI in 02/2014, HLD  - She is on ASA, BB, statin -f/u with Dr. Meda Coffee and continue medications   5. Osteoporosis with multiple fractures -Last DEXA in 2015 showed worsening osteoporosis. She declined repeat DEXA. -She had a fall  induced fracture in 01/2018. -She was on Zometa for 4 years, stopped after April 2018  6. Peripheral neuropathy in feet, G1-2 -Secondary to previous chemo.  - She previously declined Gabapentin  - I will monitor   7. Goal of care discussion  -she is full code now   Plan  -Lab reviewed, adequate for treatment, will proceed daratumumab today and continue every 3 weeks -follow up in 3 weeks  No problem-specific Assessment & Plan notes found for this encounter.   No orders of the defined types were placed in this encounter.  All questions were answered. The patient knows to call the clinic with any problems, questions or concerns. No barriers to learning was detected. I spent 20 minutes counseling the patient face to face. The total time spent in the appointment was 25 minutes and more than 50% was on counseling and review of test results  I, Manson Allan am acting as scribe for Dr. [name].  I have reviewed the above documentation for accuracy and completeness, and I agree with the above.     [name], MD 09/24/2018

## 2018-09-23 ENCOUNTER — Ambulatory Visit (INDEPENDENT_AMBULATORY_CARE_PROVIDER_SITE_OTHER): Payer: Medicare Other

## 2018-09-23 ENCOUNTER — Other Ambulatory Visit: Payer: Self-pay

## 2018-09-23 VITALS — BP 118/68 | HR 72 | Temp 97.8°F | Ht 67.0 in | Wt 123.6 lb

## 2018-09-23 DIAGNOSIS — Z0000 Encounter for general adult medical examination without abnormal findings: Secondary | ICD-10-CM

## 2018-09-23 DIAGNOSIS — C9000 Multiple myeloma not having achieved remission: Secondary | ICD-10-CM

## 2018-09-23 NOTE — Progress Notes (Signed)
Patient ID: Shannon Rush, female   DOB: 08/22/1940, 78 y.o.   MRN: [id number] I have reviewed this visit and agree with the documentation.

## 2018-09-23 NOTE — Progress Notes (Signed)
Subjective:   Shannon Rush is a 78 y.o. female who presents for Medicare Annual (Subsequent) preventive examination.  Review of Systems:  Physical assessment deferred to PCP.  Cardiac Risk Factors include: advanced age (>53mn, >>69women)     Objective:     Vitals: BP 118/68   Pulse 72   Temp 97.8 F (36.6 C) (Oral)   Ht 5' 7"  (1.702 m)   Wt 123 lb 9.6 oz (56.1 kg)   SpO2 97%   BMI 19.36 kg/m   Body mass index is 19.36 kg/m.  Advanced Directives 09/23/2018 08/18/2018 08/18/2018 01/27/2018 12/31/2017 12/31/2017 12/25/2017  Does Patient Have a Medical Advance Directive? Yes Yes No Yes Yes Yes Yes  Type of AParamed[place]of [place] will [place] will - Healthcare Power of [place]at [place]Living will Out of facility DNR (pink MOST or yellow form) HO[place]Living will  Does patient want to make changes to medical advance directive? - - - No - Patient declined No - Patient declined - No - Patient declined  Copy of [place] Chart? Yes - validated most recent copy scanned in chart (See row information) Yes - validated most recent copy scanned in chart (See row information) - No - copy requested No - copy requested - No - copy requested  Would patient like information on creating a medical advance directive? No - Patient declined - No - Patient declined - - - -    Tobacco Social History   Tobacco Use  Smoking Status Never Smoker  Smokeless Tobacco Never Used     Counseling given: Not Answered   Clinical Intake:  Pre-visit preparation completed: Yes  Pain : 0-10 Pain Score: 1  Pain Type: Chronic pain     Nutritional Status: BMI of 19-24  Normal Nutritional Risks: Other (Comment) Diabetes: No  What is the last grade level you completed in school?: pHD  Interpreter Needed?: No     Past Medical History:  Diagnosis Date  . CAD (coronary artery  disease)    [place] LHC ([place]- [place]:  pRCA 60%, mid to dist RCA 50% >>> med rx  . Cardiomyopathy ([place]   . Cataract   . Chronic combined systolic and diastolic CHF (congestive heart failure) ([place]   . Cord compression ([place] 07/13/12   MRI- diffuse myeloma involvement of T-L spine  . History of radiation therapy 07/13/12-07/27/12   spinal cord compression T3-T10,left scapula  . Hx of echocardiogram    [facility]  Normal LVEF without significant regional wall motion abnormalities. Mild   . Multiple myeloma ([place] 07/01/2012  . MVP (mitral valve prolapse)    a. trivial by echo 06/2017.  . OSTEOPOROSIS 06/11/2010   Multiple compression fractures; and spontaneous fracture of sternum Qualifier: Diagnosis of  By: SZebedee IbaNP, SManuela Schwartz    . Thoracic kyphosis 07/13/12   per MRI scan  . Unspecified deficiency anemia    Past Surgical History:  Procedure Laterality Date  . APPENDECTOMY    . CATARACT EXTRACTION, BILATERAL    . CESAREAN SECTION     x2   . COLONOSCOPY  2007   neg with Dr. MWatt Climes . ELBOW SURGERY    . HIP SURGERY  2009   left  . TUBAL LIGATION     Family History  Problem Relation Age of Onset  .  Glaucoma Mother   . Heart disease Mother   . Peripheral vascular disease Mother   . Raynaud syndrome Mother   . Heart disease Father   . Heart failure Father   . Glaucoma Father   . Cancer Brother        stage IV prostate CA  . Raynaud syndrome Daughter   . Raynaud syndrome Daughter    Social History   Socioeconomic History  . Marital status: Married    Spouse name: Fritz Pickerel  . Number of children: 2  . Years of education: Not on file  . Highest education level: Doctorate  Occupational History  . Occupation: Museum/gallery conservator: [employer]  . Financial resource strain: Not hard at all  . Food insecurity:    Worry: Never true     Inability: Never true  . Transportation needs:    Medical: No    Non-medical: No  Tobacco Use  . Smoking status: Never Smoker  . Smokeless tobacco: Never Used  Substance and Sexual Activity  . Alcohol use: Yes    Alcohol/week: 7.0 standard drinks    Types: 7 Standard drinks or equivalent per week    Comment: 1 glass of wine or beer 3-5 days per week  . Drug use: No  . Sexual activity: Yes    Birth control/protection: Post-menopausal  Lifestyle  . Physical activity:    Days per week: 5 days    Minutes per session: 60 min  . Stress: Not at all  Relationships  . Social connections:    Talks on phone: More than three times a week    Gets together: More than three times a week    Attends religious service: Never    Active member of club or organization: Yes    Attends meetings of clubs or organizations: More than 4 times per year    Relationship status: Married  Other Topics Concern  . Not on file  Social History Narrative   Who lives with you: spouse, 2 story home, does not have problems with the stairs, has walk in shower, handrails.   Any pets: Dog, Brayden, black lab/boxer mix   Diet: Patient has a varied diet of protein, vegetables, starches.  Limits red meats.   Exercise: Patient exercises at least 3 times a week for over an hour.   Seatbelts: Patient reports wearing her seatbelt when in vehicle.    Nancy Fetter Exposure/Protection: Patient reports wearing sunscreen daily.   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 Biked 5 miles this morning to this appointment.    Outpatient Encounter Medications as of 09/23/2018  Medication Sig  . acetaminophen (TYLENOL) 500 MG tablet Take 500 mg by mouth every 6 (six) hours as needed for moderate pain or fever.  Marland Kitchen acyclovir (ZOVIRAX) 400 MG tablet Take 1 tablet (400 mg total) by mouth 2 (two) times daily.  Marland Kitchen albuterol (PROVENTIL) (2.5 MG/3ML) 0.083% nebulizer solution Take 3 mLs (2.5 mg total) by nebulization every 6 (six) hours as needed for wheezing or shortness of breath.  Marland Kitchen aspirin EC 81 MG tablet Take 1 tablet (81 mg total) by mouth daily.  Marland Kitchen atorvastatin (LIPITOR) 20 MG tablet Take 1 tablet (20 mg total) by mouth daily.  . Calcium Carbonate-Vit D-Min (CALCIUM 600+D PLUS MINERALS) 600-400 MG-UNIT TABS Take by mouth.  . doxycycline (VIBRA-TABS) 100 MG tablet Take 100 mg by mouth 2 (two) times daily as needed.  . metoprolol succinate (TOPROL-XL) 25 MG 24 hr tablet Take 1 tablet (25 mg total) by mouth daily.   No facility-administered encounter medications on file as of 09/23/2018.     Activities of Daily Living In your present state of health, do you have any difficulty performing the following activities: 09/23/2018 01/27/2018  Hearing? N N  Vision? N N  Comment wears readers; has had cataract surgery in the past -  Difficulty concentrating or making decisions? N N  Walking or climbing stairs? N Y  Dressing or bathing? N Y  Doing errands, shopping? N N  Preparing Food and eating ? N -  Using the Toilet? N -  In the past six months, have you accidently leaked urine? N -  Do you have problems with loss of bowel control? N -  Managing your Medications? N -  Managing your Finances? N -  Housekeeping or managing your Housekeeping? N -  Some recent data might be hidden    Patient Care Team: [name], MD as PCP - General (Family Medicine) [name], MD  as Attending Physician (Obstetrics and Gynecology) [name], MD as Consulting Physician (Internal Medicine) [name], MD as Consulting Physician (Hematology) [name], MD as Consulting Physician (Ophthalmology) [name], MD as Consulting Physician (Cardiology) [name], MD as Consulting Physician (Dermatology) [name] (Dentistry) [name], DPM as Consulting Physician (Podiatry)    Assessment:   This is a routine wellness examination for [place].  Exercise Activities and Dietary recommendations Current Exercise Habits: Structured exercise class, Type of exercise: walking;Other - see comments, Time (Minutes): 60, Frequency (Times/Week): 5, Weekly Exercise (Minutes/Week): 300, Intensity: Moderate, Exercise limited by: None identified  Goals    . Exercise 150 minutes per week (moderate activity)     Continue current regimen       Fall Risk Fall Risk  09/23/2018 08/18/2018 03/10/2018 01/21/2018 12/25/2017  Falls in the past year? 0 0 Yes No No  Number falls in past yr: - - 1 - -  Injury with Fall? - - Yes - -  Comment - - fractured hip - -  Risk Factor Category  - - - - -  Comment - - - - -  Risk for fall due to : - - - - -  Follow up - - - - -   Is the patient's home free of loose throw rugs in walkways, pet beds, electrical cords, etc?   yes      Grab bars in the bathroom? yes  Handrails on the stairs?   yes      Adequate lighting?   yes   Depression Screen PHQ 2/9 Scores 09/23/2018 08/18/2018 03/10/2018 01/21/2018  PHQ - 2 Score 0 0 0 0  PHQ- 9 Score - - - -     Cognitive Function MMSE - Mini Mental State Exam 09/23/2018 12/26/2010  Orientation to time 5 5  Orientation to Place 5 5  Registration 3 3  Attention/ Calculation 5 5  Recall 3 3  Language- name 2 objects 2 2  Language- repeat 1 1  Language- follow 3 step command 3 3  Language- read & follow direction 1 1  Write a sentence 1 1  Copy design 1 1  Total  score 30 30     6CIT Screen 09/23/2018  What Year? 0 points  What month? 0 points  What time? 0 points  Count back from 20 0 points  Months in reverse 0 points  Repeat phrase 0 points  Total Score 0    Immunization History  Administered Date(s) Administered  . DTaP 04/20/2014, 03/22/2015  . Hepatitis B, adult 04/20/2014, 03/22/2015  . HiB (PRP-T) 04/20/2014, 03/22/2015  . IPV 04/20/2014, 03/22/2015  . Influenza Split 04/17/2012  . Influenza Whole 06/02/2009  . Influenza, High Dose Seasonal PF 04/15/2016  . Influenza,inj,Quad PF,6+ Mos 05/12/2013, 04/11/2014, 04/20/2015, 04/14/2018  . Influenza-Unspecified 04/18/2017  . Pneumococcal Conjugate-13 09/03/2013, 10/01/2013, 04/20/2014, 03/22/2015  . Pneumococcal Polysaccharide-23 11/04/2014  . Td 07/15/2001, 11/20/2013    Qualifies for Shingles Vaccine?Has had Zostavax; will get Shingrix; has not been available at her pharmacy  Screening Tests Health Maintenance  Topic Date Due  . COLONOSCOPY  07/21/2020  . MAMMOGRAM  07/24/2020  . TETANUS/TDAP  11/21/2023  . INFLUENZA VACCINE  Completed  . DEXA SCAN  Completed  . PNA vac Low Risk Adult  Completed    Cancer Screenings: Lung: Low Dose CT Chest recommended if Age 51-80 years, 30 pack-year currently smoking OR have quit w/in 15years. Patient does not qualify. Breast:  Up to date on Mammogram? Yes   Up to date of Bone Density/Dexa? Yes Colorectal: up to date    Plan:  Delightful patient. Up to date on all health maintenance items.    I have personally reviewed and noted the following in the patient's chart:   . Medical and social history . Use of alcohol, tobacco or illicit drugs  . Current medications and supplements . Functional ability and status . Nutritional status . Physical activity . Advanced directives . List of other physicians . Hospitalizations, surgeries, and ER visits in previous 12 months . Vitals . Screenings to include cognitive, depression, and  falls . Referrals and appointments  In addition, I have reviewed and discussed with patient certain preventive protocols, quality metrics, and best practice recommendations. A written personalized care plan for preventive services as well as general preventive health recommendations were provided to patient.     Esau Grew, RN  09/23/2018

## 2018-09-23 NOTE — Patient Instructions (Addendum)
Shannon Rush , Thank you for taking time to come for your Medicare Wellness Visit. I appreciate your ongoing commitment to your health goals. Please review the following plan we discussed and let me know if I can assist you in the future.   These are the goals we discussed: Goals    . Exercise 150 minutes per week (moderate activity)     Continue current regimen       This is a list of the screening recommended for you and due dates:  Health Maintenance  Topic Date Due  . Colon Cancer Screening  07/21/2020  . Mammogram  07/24/2020  . Tetanus Vaccine  11/21/2023  . Flu Shot  Completed  . DEXA scan (bone density measurement)  Completed  . Pneumonia vaccines  Completed     Leg Cramps Leg cramps occur when one or more muscles tighten and you have no control over this tightening (involuntary muscle contraction). Muscle cramps can develop in any muscle, but the most common place is in the calf muscles of the leg. Those cramps can occur during exercise or when you are at rest. Leg cramps are painful, and they may last for a few seconds to a few minutes. Cramps may return several times before they finally stop. Usually, leg cramps are not caused by a serious medical problem. In many cases, the cause is not known. Some common causes include:  Excessive physical effort (overexertion), such as during intense exercise.  Overuse from repetitive motions, or doing the same thing over and over.  Staying in a certain position for a long period of time.  Improper preparation, form, or technique while performing a sport or an activity.  Dehydration.  Injury.  Side effects of certain medicines.  Abnormally low levels of minerals in your blood (electrolytes), especially potassium and calcium. This could result from: ? Pregnancy. ? Taking diuretic medicines. Follow these instructions at home: Eating and drinking  Drink enough fluid to keep your urine pale yellow. Staying hydrated may help  prevent cramps.  Eat a healthy diet that includes plenty of nutrients to help your muscles function. A healthy diet includes fruits and vegetables, lean protein, whole grains, and low-fat or nonfat dairy products. Managing pain, stiffness, and swelling      Try massaging, stretching, and relaxing the affected muscle. Do this for several minutes at a time.  If directed, put ice on areas that are sore or painful after a cramp: ? Put ice in a plastic bag. ? Place a towel between your skin and the bag. ? Leave the ice on for 20 minutes, 2-3 times a day.  If directed, apply heat to muscles that are tense or tight. Do this before you exercise, or as often as told by your health care provider. Use the heat source that your health care provider recommends, such as a moist heat pack or a heating pad. ? Place a towel between your skin and the heat source. ? Leave the heat on for 20-30 minutes. ? Remove the heat if your skin turns bright red. This is especially important if you are unable to feel pain, heat, or cold. You may have a greater risk of getting burned.  Try taking hot showers or baths to help relax tight muscles. General instructions  If you are having frequent leg cramps, avoid intense exercise for several days.  Take over-the-counter and prescription medicines only as told by your health care provider.  Keep all follow-up visits as told by your  health care provider. This is important. Contact a health care provider if:  Your leg cramps get more severe or more frequent, or they do not improve over time.  Your foot becomes cold, numb, or blue. Summary  Muscle cramps can develop in any muscle, but the most common place is in the calf muscles of the leg.  Leg cramps are painful, and they may last for a few seconds to a few minutes.  Usually, leg cramps are not caused by a serious medical problem. Often, the cause is not known.  Stay hydrated and take over-the-counter and  prescription medicines only as told by your health care provider. This information is not intended to replace advice given to you by your health care provider. Make sure you discuss any questions you have with your health care provider. Document Released: 08/08/2004 Document Revised: 04/10/2017 Document Reviewed: 04/10/2017 Elsevier Interactive Patient Education  2019 [place] Maintenance After Age 24 After age 48, you are at a higher risk for certain long-term diseases and infections as well as injuries from falls. Falls are a major cause of broken bones and head injuries in people who are older than age 47. Getting regular preventive care can help to keep you healthy and well. Preventive care includes getting regular testing and making lifestyle changes as recommended by your health care provider. Talk with your health care provider about:  Which screenings and tests you should have. A screening is a test that checks for a disease when you have no symptoms.  A diet and exercise plan that is right for you. What should I know about screenings and tests to prevent falls? Screening and testing are the best ways to find a health problem early. Early diagnosis and treatment give you the best chance of managing medical conditions that are common after age 12. Certain conditions and lifestyle choices may make you more likely to have a fall. Your health care provider may recommend:  Regular vision checks. Poor vision and conditions such as cataracts can make you more likely to have a fall. If you wear glasses, make sure to get your prescription updated if your vision changes.  Medicine review. Work with your health care provider to regularly review all of the medicines you are taking, including over-the-counter medicines. Ask your health care provider about any side effects that may make you more likely to have a fall. Tell your health care provider if any medicines that you take make you  feel dizzy or sleepy.  Osteoporosis screening. Osteoporosis is a condition that causes the bones to get weaker. This can make the bones weak and cause them to break more easily.  Blood pressure screening. Blood pressure changes and medicines to control blood pressure can make you feel dizzy.  Strength and balance checks. Your health care provider may recommend certain tests to check your strength and balance while standing, walking, or changing positions.  Foot health exam. Foot pain and numbness, as well as not wearing proper footwear, can make you more likely to have a fall.  Depression screening. You may be more likely to have a fall if you have a fear of falling, feel emotionally low, or feel unable to do activities that you used to do.  Alcohol use screening. Using too much alcohol can affect your balance and may make you more likely to have a fall. What actions can I take to lower my risk of falls? General instructions  Talk with your health care  provider about your risks for falling. Tell your health care provider if: ? You fall. Be sure to tell your health care provider about all falls, even ones that seem minor. ? You feel dizzy, sleepy, or off-balance.  Take over-the-counter and prescription medicines only as told by your health care provider. These include any supplements.  Eat a healthy diet and maintain a healthy weight. A healthy diet includes low-fat dairy products, low-fat (lean) meats, and fiber from whole grains, beans, and lots of fruits and vegetables. Home safety  Remove any tripping hazards, such as rugs, cords, and clutter.  Install safety equipment such as grab bars in bathrooms and safety rails on stairs.  Keep rooms and walkways well-lit. Activity   Follow a regular exercise program to stay fit. This will help you maintain your balance. Ask your health care provider what types of exercise are appropriate for you.  If you need a cane or walker, use it as  recommended by your health care provider.  Wear supportive shoes that have nonskid soles. Lifestyle  Do not drink alcohol if your health care provider tells you not to drink.  If you drink alcohol, limit how much you have: ? 0-1 drink a day for women. ? 0-2 drinks a day for men.  Be aware of how much alcohol is in your drink. In the [place] one drink equals one typical bottle of beer (12 oz), one-half glass of wine (5 oz), or one shot of hard liquor (1 oz).  Do not use any products that contain nicotine or tobacco, such as cigarettes and e-cigarettes. If you need help quitting, ask your health care provider. Summary  Having a healthy lifestyle and getting preventive care can help to protect your health and wellness after age 40.  Screening and testing are the best way to find a health problem early and help you avoid having a fall. Early diagnosis and treatment give you the best chance for managing medical conditions that are more common for people who are older than age 38.  Falls are a major cause of broken bones and head injuries in people who are older than age 18. Take precautions to prevent a fall at home.  Work with your health care provider to learn what changes you can make to improve your health and wellness and to prevent falls. This information is not intended to replace advice given to you by your health care provider. Make sure you discuss any questions you have with your health care provider. Document Released: 05/14/2017 Document Revised: 05/14/2017 Document Reviewed: 05/14/2017 Elsevier Interactive Patient Education  2019 [employer].

## 2018-09-24 ENCOUNTER — Inpatient Hospital Stay: Payer: Medicare Other

## 2018-09-24 ENCOUNTER — Inpatient Hospital Stay: Payer: Medicare Other | Attending: Hematology

## 2018-09-24 ENCOUNTER — Encounter: Payer: Self-pay | Admitting: Hematology

## 2018-09-24 ENCOUNTER — Inpatient Hospital Stay: Payer: Medicare Other | Admitting: Hematology

## 2018-09-24 ENCOUNTER — Telehealth: Payer: Self-pay | Admitting: Hematology

## 2018-09-24 VITALS — BP 104/62 | HR 70 | Temp 97.9°F | Resp 18

## 2018-09-24 VITALS — BP 135/74 | HR 86 | Temp 97.6°F | Resp 18 | Ht 67.0 in | Wt 122.1 lb

## 2018-09-24 DIAGNOSIS — I252 Old myocardial infarction: Secondary | ICD-10-CM | POA: Insufficient documentation

## 2018-09-24 DIAGNOSIS — D61818 Other pancytopenia: Secondary | ICD-10-CM | POA: Diagnosis not present

## 2018-09-24 DIAGNOSIS — E785 Hyperlipidemia, unspecified: Secondary | ICD-10-CM

## 2018-09-24 DIAGNOSIS — Z9221 Personal history of antineoplastic chemotherapy: Secondary | ICD-10-CM | POA: Diagnosis not present

## 2018-09-24 DIAGNOSIS — M810 Age-related osteoporosis without current pathological fracture: Secondary | ICD-10-CM

## 2018-09-24 DIAGNOSIS — Z7982 Long term (current) use of aspirin: Secondary | ICD-10-CM

## 2018-09-24 DIAGNOSIS — G629 Polyneuropathy, unspecified: Secondary | ICD-10-CM

## 2018-09-24 DIAGNOSIS — Z79899 Other long term (current) drug therapy: Secondary | ICD-10-CM

## 2018-09-24 DIAGNOSIS — C9000 Multiple myeloma not having achieved remission: Secondary | ICD-10-CM

## 2018-09-24 DIAGNOSIS — M818 Other osteoporosis without current pathological fracture: Secondary | ICD-10-CM

## 2018-09-24 DIAGNOSIS — Z923 Personal history of irradiation: Secondary | ICD-10-CM | POA: Diagnosis not present

## 2018-09-24 DIAGNOSIS — I2510 Atherosclerotic heart disease of native coronary artery without angina pectoris: Secondary | ICD-10-CM | POA: Diagnosis not present

## 2018-09-24 DIAGNOSIS — Z5112 Encounter for antineoplastic immunotherapy: Secondary | ICD-10-CM | POA: Insufficient documentation

## 2018-09-24 DIAGNOSIS — T451X5A Adverse effect of antineoplastic and immunosuppressive drugs, initial encounter: Secondary | ICD-10-CM

## 2018-09-24 DIAGNOSIS — D6481 Anemia due to antineoplastic chemotherapy: Secondary | ICD-10-CM

## 2018-09-24 LAB — CBC WITH DIFFERENTIAL (CANCER CENTER ONLY)
ABS IMMATURE GRANULOCYTES: 0.01 10*3/uL (ref 0.00–0.07)
Basophils Absolute: 0 10*3/uL (ref 0.0–0.1)
Basophils Relative: 1 %
Eosinophils Absolute: 0.1 10*3/uL (ref 0.0–0.5)
Eosinophils Relative: 3 %
HCT: 32.7 % — ABNORMAL LOW (ref 36.0–46.0)
Hemoglobin: 10.7 g/dL — ABNORMAL LOW (ref 12.0–15.0)
Immature Granulocytes: 0 %
Lymphocytes Relative: 16 %
Lymphs Abs: 0.5 10*3/uL — ABNORMAL LOW (ref 0.7–4.0)
MCH: 34.5 pg — ABNORMAL HIGH (ref 26.0–34.0)
MCHC: 32.7 g/dL (ref 30.0–36.0)
MCV: 105.5 fL — ABNORMAL HIGH (ref 80.0–100.0)
Monocytes Absolute: 0.3 10*3/uL (ref 0.1–1.0)
Monocytes Relative: 10 %
NRBC: 0 % (ref 0.0–0.2)
Neutro Abs: 2.3 10*3/uL (ref 1.7–7.7)
Neutrophils Relative %: 70 %
Platelet Count: 167 10*3/uL (ref 150–400)
RBC: 3.1 MIL/uL — ABNORMAL LOW (ref 3.87–5.11)
RDW: 15.6 % — ABNORMAL HIGH (ref 11.5–15.5)
WBC Count: 3.3 10*3/uL — ABNORMAL LOW (ref 4.0–10.5)

## 2018-09-24 LAB — CMP (CANCER CENTER ONLY)
ALT: 12 U/L (ref 0–44)
AST: 20 U/L (ref 15–41)
Albumin: 3.5 g/dL (ref 3.5–5.0)
Alkaline Phosphatase: 58 U/L (ref 38–126)
Anion gap: 11 (ref 5–15)
BUN: 13 mg/dL (ref 8–23)
CALCIUM: 9.2 mg/dL (ref 8.9–10.3)
CO2: 23 mmol/L (ref 22–32)
Chloride: 106 mmol/L (ref 98–111)
Creatinine: 0.84 mg/dL (ref 0.44–1.00)
Glucose, Bld: 87 mg/dL (ref 70–99)
Potassium: 4.1 mmol/L (ref 3.5–5.1)
Sodium: 140 mmol/L (ref 135–145)
Total Bilirubin: 0.8 mg/dL (ref 0.3–1.2)
Total Protein: 6 g/dL — ABNORMAL LOW (ref 6.5–8.1)

## 2018-09-24 MED ORDER — ACETAMINOPHEN 325 MG PO TABS
ORAL_TABLET | ORAL | Status: AC
  Filled 2018-09-24: qty 2

## 2018-09-24 MED ORDER — MONTELUKAST SODIUM 10 MG PO TABS
ORAL_TABLET | ORAL | Status: AC
  Filled 2018-09-24: qty 1

## 2018-09-24 MED ORDER — ACETAMINOPHEN 325 MG PO TABS
650.0000 mg | ORAL_TABLET | Freq: Once | ORAL | Status: AC
  Administered 2018-09-24: 650 mg via ORAL

## 2018-09-24 MED ORDER — METHYLPREDNISOLONE SODIUM SUCC 125 MG IJ SOLR
INTRAMUSCULAR | Status: AC
  Filled 2018-09-24: qty 2

## 2018-09-24 MED ORDER — SODIUM CHLORIDE 0.9 % IV SOLN
Freq: Once | INTRAVENOUS | Status: AC
  Administered 2018-09-24: 12:00:00 via INTRAVENOUS
  Filled 2018-09-24: qty 250

## 2018-09-24 MED ORDER — MONTELUKAST SODIUM 10 MG PO TABS
10.0000 mg | ORAL_TABLET | Freq: Once | ORAL | Status: AC
  Administered 2018-09-24: 10 mg via ORAL

## 2018-09-24 MED ORDER — METHYLPREDNISOLONE SODIUM SUCC 125 MG IJ SOLR
125.0000 mg | Freq: Once | INTRAMUSCULAR | Status: AC
  Administered 2018-09-24: 125 mg via INTRAVENOUS

## 2018-09-24 MED ORDER — SODIUM CHLORIDE 0.9 % IV SOLN
16.0000 mg/kg | Freq: Once | INTRAVENOUS | Status: AC
  Administered 2018-09-24: 900 mg via INTRAVENOUS
  Filled 2018-09-24: qty 40

## 2018-09-24 MED ORDER — DIPHENHYDRAMINE HCL 25 MG PO CAPS
ORAL_CAPSULE | ORAL | Status: AC
  Filled 2018-09-24: qty 2

## 2018-09-24 MED ORDER — DIPHENHYDRAMINE HCL 25 MG PO CAPS
50.0000 mg | ORAL_CAPSULE | Freq: Once | ORAL | Status: AC
  Administered 2018-09-24: 50 mg via ORAL

## 2018-09-24 NOTE — Patient Instructions (Signed)
[facility] Discharge Instructions for Patients Receiving Chemotherapy  Today you received the following chemotherapy agents Daratumumab (DARZALEX).  To help prevent nausea and vomiting after your treatment, we encourage you to take your nausea medication as prescribed.   If you develop nausea and vomiting that is not controlled by your nausea medication, call the clinic.   BELOW ARE SYMPTOMS THAT SHOULD BE REPORTED IMMEDIATELY:  *FEVER GREATER THAN 100.5 F  *CHILLS WITH OR WITHOUT FEVER  NAUSEA AND VOMITING THAT IS NOT CONTROLLED WITH YOUR NAUSEA MEDICATION  *UNUSUAL SHORTNESS OF BREATH  *UNUSUAL BRUISING OR BLEEDING  TENDERNESS IN MOUTH AND THROAT WITH OR WITHOUT PRESENCE OF ULCERS  *URINARY PROBLEMS  *BOWEL PROBLEMS  UNUSUAL RASH Items with * indicate a potential emergency and should be followed up as soon as possible.  Feel free to call the clinic should you have any questions or concerns. The clinic phone number is [phone].  Please show the CHEMO ALERT CARD at check-in to the Emergency Department and triage nurse.   

## 2018-09-24 NOTE — Telephone Encounter (Signed)
Called and scheduled appt per 3/12 los.  Patient aware of appt date and time.

## 2018-09-25 LAB — PROTEIN ELECTROPHORESIS, SERUM
A/G Ratio: 1.7 (ref 0.7–1.7)
Albumin ELP: 3.4 g/dL (ref 2.9–4.4)
Alpha-1-Globulin: 0.2 g/dL (ref 0.0–0.4)
Alpha-2-Globulin: 0.6 g/dL (ref 0.4–1.0)
Beta Globulin: 0.7 g/dL (ref 0.7–1.3)
Gamma Globulin: 0.5 g/dL (ref 0.4–1.8)
Globulin, Total: 2 g/dL — ABNORMAL LOW (ref 2.2–3.9)
M-Spike, %: 0.2 g/dL — ABNORMAL HIGH
TOTAL PROTEIN ELP: 5.4 g/dL — AB (ref 6.0–8.5)

## 2018-09-25 LAB — KAPPA/LAMBDA LIGHT CHAINS
Kappa free light chain: 22.9 mg/L — ABNORMAL HIGH (ref 3.3–19.4)
Kappa, lambda light chain ratio: 6.54 — ABNORMAL HIGH (ref 0.26–1.65)
Lambda free light chains: 3.5 mg/L — ABNORMAL LOW (ref 5.7–26.3)

## 2018-09-29 LAB — IFE, DARA-SPECIFIC, SERUM
IgA: 6 mg/dL — ABNORMAL LOW (ref 64–422)
IgG (Immunoglobin G), Serum: 667 mg/dL — ABNORMAL LOW (ref 700–1600)
IgM (Immunoglobulin M), Srm: 21 mg/dL — ABNORMAL LOW (ref 26–217)

## 2018-10-12 ENCOUNTER — Telehealth: Payer: Self-pay | Admitting: Hematology

## 2018-10-12 NOTE — Telephone Encounter (Signed)
Rescheduled appt per 3/28 sch message.  Called patient and patient aware od appt date and time.

## 2018-10-16 ENCOUNTER — Ambulatory Visit: Payer: Medicare Other | Admitting: Hematology

## 2018-10-16 ENCOUNTER — Other Ambulatory Visit: Payer: Medicare Other

## 2018-10-16 ENCOUNTER — Ambulatory Visit: Payer: Medicare Other

## 2018-10-19 ENCOUNTER — Telehealth: Payer: Self-pay | Admitting: Family Medicine

## 2018-10-19 NOTE — Telephone Encounter (Signed)
Patient scraped her leg 10 days ago. Has been caring for it at home by changing dressing daily, cleaning with warm water and soap, topical OTC antibiotic ointment. No fever, worsening redness or drainage. Seems to be about the same or maybe slightly better. Wants to know if any additional recommendations as it has been slow to heal. Recommended continued current management. Reviewed signs of infection, patient will call if she has any of those symptoms. Voiced good understanding.

## 2018-10-22 NOTE — Progress Notes (Signed)
[place]   [phone] [phone]   Clinic Follow up Note   Patient Care Team: [name], MD as PCP - General (Family Medicine) [name], MD as Attending Physician (Obstetrics and Gynecology) [name], MD as Consulting Physician (Internal Medicine) [name], MD as Consulting Physician (Hematology) [name], MD as Consulting Physician (Ophthalmology) [name], MD as Consulting Physician (Cardiology) [name], MD as Consulting Physician (Dermatology) [name] (Dentistry) [name], DPM as Consulting Physician (Podiatry)  Date of Service:  10/23/2018  CHIEF COMPLAINT: F/u on MM  SUMMARY OF ONCOLOGIC HISTORY: Oncology History   Multiple myeloma [facility])   Staging form: Multiple Myeloma, AJCC 6th Edition   - Clinical: Stage IIIA - Signed by [name], MD on 09/04/2013      Multiple myeloma ([place])   05/26/2012 Initial Diagnosis    Presenting IgG was 4,040 mg/dL on 06/05/2012 (IgA 35; IgM 34); kappa free light chain 34.4 mg/dL, lambda 0.00, kappa:lambda ratio of 34.75; SPEP with M-spike of 2.60.    06/30/2012 Imaging    Numerous lytic lesions throughout the calvarium.  Lytic lesion within the left lateral scapula.  Findings most compatible with metastases or myeloma. Multiple mid and lower thoracic compression fractures.  Slight compression through the endplates at L3.    20/94/7096 Bone Marrow Biopsy    Bone marrow biopsy showed 49% plasma cell. Normal classical cytogenetics; however, myeloma FISH panel showed 13q- (intermediate risk)        07/14/2012 - 07/27/2012 Radiation Therapy    Palliative radiation 20 Gy over 10 fractions between to thoracic spine cord compression and symptomatic left scapula lytic lesion.    08/10/2012 - 11/2012 Chemotherapy    Started SQ Velcade once weekly, 3 weeks on, 1 weeks off; daily Revlimid d1-21, 7 days off; and Dexamethasone 37m PO weekly.     02/12/2013 Bone Marrow Transplant    [facility]     06/14/2013 Tumor Marker    IgG  1830,  Kappa:lamba ratio, 1.69 (baseline was 34.75 or   M-spike 0.28 (baseline of 2.6 or  89.3% of baseline).      06/25/2013 -  Chemotherapy    Started zometa 3.5 mg monthly     09/01/2013 - 01/2014 Chemotherapy    Maintenance therapy with revlimid 525mdaily for 14 days and then off for 7 (decreased from 21 days on and 7 days off based on neutropenia). Stopped due to disease progression 01/2017    09/13/2013 - 09/17/2014 Hospital Admission    Hospital admission for pneumonia    12/20/2013 Treatment Plan Change    Maintenance therapy decreased to 2.5 mg daily for 21 days and then off for 7 days based on low counts.     01/25/2014 Tumor Marker    IgG 1360 M spike 0.5    03/01/2014 - 03/05/2014 Hospital Admission    [facility], received thrombolytic therapy, cath showed 60% prox RCA, mid-distal RCA 50%, Echo normal, no stents.    04/11/2014 - 08/07/2014 Chemotherapy    Continue Revlimid at 2.5 mg 3 weeks on 1 week off    08/08/2014 - 08/13/2014 Hospital Admission    Hospital admission for pneumonia. Her Revlimid was held.    08/29/2014 - 09/14/2014 Chemotherapy    Maintenance Revlimid restarted    09/14/2014 - 09/17/2014 Hospital Admission    [place]    11/13/2014 - 12/25/2015 Chemotherapy    Maintenance Revlimid restarted, changed to 2.24m daily, 2 weks on, 1 week off from 12/15/2014, stopped due to disease progression     01/24/2016 Progression    Patient is M protein has gradually increased to 1.0g, repeated a bone marrow biopsy showed plasma cell 10-30%    02/27/2016 - 03/07/2016 Chemotherapy    Pomalidomide 4 mg daily on day 1-21, dexamethasone 40 mg on day 1, 8 and 15, every 28 days, started on 02/27/2016, daratumumab weekly started on 03/07/2016, held  after first dose, due to hospitalization and a severe pancytopenia, pomalidomide was stopped afterwards     03/10/2016 - 03/29/2016 Hospital Admission    Pt was admitted for sepsis from pneumonia, and severe pancytopenia. She required ICU stay for a few days due to hypotension, developed b/l pleural effusion required diuretics and b/l thoracentesis. She also required blood and plt transfusion, and prolonged neupogen injection for severe neutropenia. She was discharged home after 3 weeks hospital stay     04/26/2016 -  Chemotherapy    Daratumumab restarted on 04/26/2016, Velcade 1.384mm2 and dexa 205meekly start on 11/3, velcade held since 06/28/2016 due to CHF, dexa stopped on12/11/2017 due to high risk of fracture     08/26/2016 Echocardiogram    - Left ventricle: LVEF is approximately 35% with diffuse diffuse   hypokinesis with severe hypokinesis/ akinesis of the   inferior/inferoseptal walls. In comparison to echo images from   December 2017, there does not appear to be significant change The   cavity size was normal. Wall thickness was normal. Doppler   parameters are consistent with abnormal left ventricular   relaxation (grade 1 diastolic dysfunction). - Aortic valve: AV is thickened. 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 Present in echo from December 2017 There was   trivial regurgitation. - Right ventricle: Systolic function was mildly reduced. - Right atrium: The atrium was mildly dilated. - Pericardium, extracardiac: A trivial pericardial effusion was   identified.    12/03/2016 Echocardiogram    -Left Ventricle: Systolic function was   mildly to moderately reduced. The estimated ejection fraction was   in the range of 40% to 45%. Diffuse hypokinesis. Doppler   parameters are consistent with abnormal left ventricular   relaxation (grade 1 diastolic dysfunction). Doppler parameters   are consistent with  indeterminate ventricular filling pressure. - Left atrium: The atrium was severely dilated. - Tricuspid valve: There was trivial regurgitation. - Pulmonary arteries: Systolic pressure was within the normal   range. PA peak pressure: 33 mm Hg (S). - Global longitudinal strain -10.5% (abnormal)    03/12/2017 Echocardiogram    ECHO 03/12/17 Study Conclusions - Left ventricle: The cavity size was normal. Wall thickness was   increased in a pattern of mild LVH. Systolic function was normal.   The estimated ejection fraction was in the range of 50% to 55%.   There is hypokinesis of the basalinferior myocardium. Doppler   parameters are consistent with abnormal left ventricular   relaxation (grade 1 diastolic dysfunction). - Aortic valve: There was trivial regurgitation. - Mitral valve: Calcified annulus. Mildly thickened leaflets .   There was trivial regurgitation. - Tricuspid valve: There was mild regurgitation. Impressions: - There has been mild improvement in EF since prior study.     12/12/2017 - 12/15/2017 Hospital Admission    Admission diagnosis: Pnuemonia and acute hypoxic respiratory failure Additional  comments: treated for pneumoania and acute hypoxic respiratory failure before she was discharged on 12/15/17 with oral anitbiotics. She did not require home oxygen.     12/30/2017 - 01/02/2018 Hospital Admission    Admission diagnosis: Pnuemonia  Additional comments: She was again hospitalized on 12/30/17 for pneumonia, ID work up was otherwise negative, she was discharged home with oral antibiotics.     04/01/2018 Imaging    04/01/2018 CXR IMPRESSION: Persisting airspace disease superimposed on chronic changes, suggesting pneumonia given the history. Followup PA and lateral chest X-ray is recommended in 3-4 weeks following therapy to ensure resolution and exclude underlying malignancy.  Redemonstration of mid and lower thoracic compression fractures with associated thoracic  kyphosis.    04/23/2018 Echocardiogram    04/23/2018 ECHO LV EF: 45% -   50%     CURRENT THERAPY  Daratumumabevery 3 weeks. Dexamethasone a day before treatment.Dexa stopped in 1.2020 due to well controlled MM and high risk of fracture  INTERVAL HISTORY:  Shannon Rush is here for a follow up and treatment. She presents to the clinic today by herself. She notes she is doing well. She has a sore on her right lower leg with skin erythema from dermatitis She has been using triple antibiotics ointment. She notes she has stable mild cough and denies SOB or fever.  She notes she has been doing online shopping but still wants to go in the store.     REVIEW OF SYSTEMS:   Constitutional: Denies fevers, chills or abnormal weight loss Eyes: Denies blurriness of vision Ears, nose, mouth, throat, and face: Denies mucositis or sore throat Respiratory: Denies dyspnea or wheezes (+) Stable mild cough  Cardiovascular: Denies palpitation, chest discomfort or lower extremity swelling Gastrointestinal:  Denies nausea, heartburn or change in bowel habits Skin: Denies abnormal skin rashes (+) Skin dermatitis of right lower leg.  Lymphatics: Denies new lymphadenopathy or easy bruising Neurological:Denies numbness, tingling or new weaknesses Behavioral/Psych: Mood is stable, no new changes  All other systems were reviewed with the patient and are negative.  MEDICAL HISTORY:  Past Medical History:  Diagnosis Date  . CAD (coronary artery disease)    a. inf STEMI (in [place] >>> [facility]ytics) >>> LHC (8/15- [place]):  pRCA 60%, mid to dist RCA 50% >>> med rx  . Cardiomyopathy ([place])   . Cataract   . Chronic combined systolic and diastolic CHF (congestive heart failure) ([place])   . Cord compression ([place]) 07/13/12   MRI- diffuse myeloma involvement of T-L spine  . History of radiation therapy 07/13/12-07/27/12   spinal cord compression T3-T10,left scapula  . Hx of echocardiogram    Echo  (03/02/14-done at [facility]):  Normal LVEF without significant regional wall motion abnormalities. Mild   . Multiple myeloma ([place]) 07/01/2012  . MVP (mitral valve prolapse)    a. trivial by echo 06/2017.  . OSTEOPOROSIS 06/11/2010   Multiple compression fractures; and spontaneous fracture of sternum Qualifier: Diagnosis of  By: [name], [name]     . Thoracic kyphosis 07/13/12   per MRI scan  . Unspecified deficiency anemia     SURGICAL HISTORY: Past Surgical History:  Procedure Laterality Date  . APPENDECTOMY    . CATARACT EXTRACTION, BILATERAL    . CESAREAN SECTION     x2   . COLONOSCOPY  2007   neg with Dr. [name]  . ELBOW SURGERY    . HIP SURGERY  2009   left  . TUBAL  LIGATION      I have reviewed the social history and family history with the patient and they are unchanged from previous note.  ALLERGIES:  is allergic to zithromax [azithromycin]; zosyn [piperacillin sod-tazobactam so]; and quinolones.  MEDICATIONS:  Current Outpatient Medications  Medication Sig Dispense Refill  . acetaminophen (TYLENOL) 500 MG tablet Take 500 mg by mouth every 6 (six) hours as needed for moderate pain or fever.    Shannon Kitchen acyclovir (ZOVIRAX) 400 MG tablet Take 1 tablet (400 mg total) by mouth 2 (two) times daily. 60 tablet 11  . albuterol (PROVENTIL) (2.5 MG/3ML) 0.083% nebulizer solution Take 3 mLs (2.5 mg total) by nebulization every 6 (six) hours as needed for wheezing or shortness of breath. 75 mL 12  . aspirin EC 81 MG tablet Take 1 tablet (81 mg total) by mouth daily. 90 tablet 3  . atorvastatin (LIPITOR) 20 MG tablet Take 1 tablet (20 mg total) by mouth daily. 90 tablet 2  . Calcium Carbonate-Vit D-Min (CALCIUM 600+D PLUS MINERALS) 600-400 MG-UNIT TABS Take by mouth.    . metoprolol succinate (TOPROL-XL) 25 MG 24 hr tablet Take 1 tablet (25 mg total) by mouth daily. 90 tablet 2  . doxycycline (VIBRA-TABS) 100 MG tablet Take 100 mg by mouth 2 (two) times daily as  needed.     No current facility-administered medications for this visit.     PHYSICAL EXAMINATION: ECOG PERFORMANCE STATUS: 1 - Symptomatic but completely ambulatory  Vitals:   10/23/18 1337  BP: 136/69  Pulse: 68  Resp: 17  Temp: 98 F (36.7 C)  SpO2: 100%   Filed Weights   10/23/18 1337  Weight: 122 lb 12.8 oz (55.7 kg)    GENERAL:alert, no distress and comfortable SKIN: skin color, texture, turgor are normal, no rashes or significant lesions (+) 2 healing clean wounds in right LE below knee, no discharge, with mild surrounding skin erythema, no edema  EYES: normal, Conjunctiva are pink and non-injected, sclera clear OROPHARYNX:no exudate, no erythema and lips, buccal mucosa, and tongue normal  NECK: supple, thyroid normal size, non-tender, without nodularity LYMPH:  no palpable lymphadenopathy in the cervical, axillary or inguinal LUNGS: clear to auscultation and percussion with normal breathing effort HEART: regular rate & rhythm and no murmurs and no lower extremity edema ABDOMEN:abdomen soft, non-tender and normal bowel sounds Musculoskeletal:no cyanosis of digits and no clubbing  NEURO: alert & oriented x 3 with fluent speech, no focal motor/sensory deficits  LABORATORY DATA:  I have reviewed the data as listed CBC Latest Ref Rng & Units 10/23/2018 09/24/2018 09/03/2018  WBC 4.0 - 10.5 K/uL 3.5(L) 3.3(L) 5.4  Hemoglobin 12.0 - 15.0 g/dL 12.3 10.7(L) 11.5(L)  Hematocrit 36.0 - 46.0 % 38.5 32.7(L) 35.8(L)  Platelets 150 - 400 K/uL 165 167 235     CMP Latest Ref Rng & Units 10/23/2018 09/24/2018 09/03/2018  Glucose 70 - 99 mg/dL 119(H) 87 92  BUN 8 - 23 mg/dL 18 13 18   Creatinine 0.44 - 1.00 mg/dL 0.86 0.84 0.87  Sodium 135 - 145 mmol/L 139 140 143  Potassium 3.5 - 5.1 mmol/L 4.2 4.1 3.8  Chloride 98 - 111 mmol/L 105 106 105  CO2 22 - 32 mmol/L 20(L) 23 24  Calcium 8.9 - 10.3 mg/dL 9.3 9.2 9.4  Total Protein 6.5 - 8.1 g/dL 6.7 6.0(L) 6.6  Total Bilirubin 0.3 - 1.2  mg/dL 0.6 0.8 0.6  Alkaline Phos 38 - 126 U/L 55 58 70  AST 15 - 41 U/L  22 20 19   ALT 0 - 44 U/L 15 12 12       RADIOGRAPHIC STUDIES: I have personally reviewed the radiological images as listed and agreed with the findings in the report. No results found.   ASSESSMENT & PLAN:  LANESHIA PINA is a 78 y.o. female with   1. IgG kappa Multiple myeloma s/p Auto SCT, intermediate risk, relapsed in 01/2016 -Diagnosed in 05/2012. Treated with induction chemo, radiation and BM transplant, but unfortunately progressed while on maintenance Revlimid 01/2016. She is currently onDaratumumabinfusion every 3 weeksmonotherapy -Ipreviously stopped heroral dexa due to her high risk of fracture, and well controlled MM -Her recent M-protein has been 0.1-0.2, still positive on Dara-specific IFE with low level  -I reviewed in detail COVID-19 precautions for her and her care-giver given she is immunocompromised. She understands and I strongly encouraged her to continue social distancing and no traveling. She voiced good understanding.  -She is clinically doing well and stable.  Labs reviewed, CBC shows WBC 3.5 and lymphocyte 0.5 and otherwise normal, CMP shows unremarkable. MM panel still pending, stable lately. Overall adequate to proceed with Dara infusion today. Given COVID-19, will spread treatment out to every 4 weeks for now. She is agreeable.  -I encouraged her to contact our clinic if she develops cough with phlegm or fever.  -F/u in 4 weeks   2. History of multiple pneumonia and bronchitis -She has had multiple episodes of pneumonia, required hospitalization, and bronchitis, required antibiotics -She has recovered well, will continue monitoring.  I encouraged her to do spirometry, and exercise. -Her vaccines are up-to-date.  3. Pancytopenia -Secondary to [place].  4. CHF, CAD, inferior STEMI in 02/2014, HLD  -She is on ASA, BB, statin -f/u with Dr. Meda Coffee and continue medications  5.  Osteoporosis with multiple fractures -Last DEXA in 2015 showed worsening osteoporosis. She declined repeat DEXA. -She had a fall induced fracture in 01/2018. -She was on Zometa for 4 years, stopped after April 2018  6. Peripheral neuropathy in feet, G1-2 -Secondary to previous chemo.  -She previously declined Gabapentin  -I will monitor   7. Goal of care discussion  -she is full code now  8. Skin ulcers of right lower leg -she has two small skin ulcers on right LE, healing.  -She declined further antibiotics at this time  -She will continue antibiotics ointment.   Plan  -Lab reviewed, adequate for Daratumumab infusion today  -Lab, f/u and dara in 4 weeks    No problem-specific Assessment & Plan notes found for this encounter.   No orders of the defined types were placed in this encounter.  All questions were answered. The patient knows to call the clinic with any problems, questions or concerns. No barriers to learning was detected.     [name], MD 10/23/2018   I, Joslyn Devon, am acting as scribe for [name], MD.   I have reviewed the above documentation for accuracy and completeness, and I agree with the above.

## 2018-10-23 ENCOUNTER — Other Ambulatory Visit: Payer: Self-pay

## 2018-10-23 ENCOUNTER — Telehealth: Payer: Self-pay | Admitting: Hematology

## 2018-10-23 ENCOUNTER — Inpatient Hospital Stay: Payer: Medicare Other

## 2018-10-23 ENCOUNTER — Inpatient Hospital Stay: Payer: Medicare Other | Attending: Hematology

## 2018-10-23 ENCOUNTER — Encounter: Payer: Self-pay | Admitting: Hematology

## 2018-10-23 ENCOUNTER — Inpatient Hospital Stay (HOSPITAL_BASED_OUTPATIENT_CLINIC_OR_DEPARTMENT_OTHER): Payer: Medicare Other | Admitting: Hematology

## 2018-10-23 VITALS — BP 136/69 | HR 68 | Temp 98.0°F | Resp 17 | Ht 67.0 in | Wt 122.8 lb

## 2018-10-23 VITALS — BP 126/77 | HR 69 | Temp 97.8°F | Resp 18

## 2018-10-23 DIAGNOSIS — Z7982 Long term (current) use of aspirin: Secondary | ICD-10-CM | POA: Insufficient documentation

## 2018-10-23 DIAGNOSIS — I2510 Atherosclerotic heart disease of native coronary artery without angina pectoris: Secondary | ICD-10-CM | POA: Diagnosis not present

## 2018-10-23 DIAGNOSIS — L97829 Non-pressure chronic ulcer of other part of left lower leg with unspecified severity: Secondary | ICD-10-CM | POA: Diagnosis not present

## 2018-10-23 DIAGNOSIS — M810 Age-related osteoporosis without current pathological fracture: Secondary | ICD-10-CM | POA: Diagnosis not present

## 2018-10-23 DIAGNOSIS — T451X5S Adverse effect of antineoplastic and immunosuppressive drugs, sequela: Secondary | ICD-10-CM | POA: Diagnosis not present

## 2018-10-23 DIAGNOSIS — Z5112 Encounter for antineoplastic immunotherapy: Secondary | ICD-10-CM | POA: Diagnosis not present

## 2018-10-23 DIAGNOSIS — D61810 Antineoplastic chemotherapy induced pancytopenia: Secondary | ICD-10-CM

## 2018-10-23 DIAGNOSIS — I252 Old myocardial infarction: Secondary | ICD-10-CM

## 2018-10-23 DIAGNOSIS — M818 Other osteoporosis without current pathological fracture: Secondary | ICD-10-CM

## 2018-10-23 DIAGNOSIS — Z79899 Other long term (current) drug therapy: Secondary | ICD-10-CM | POA: Diagnosis not present

## 2018-10-23 DIAGNOSIS — I5042 Chronic combined systolic (congestive) and diastolic (congestive) heart failure: Secondary | ICD-10-CM | POA: Diagnosis not present

## 2018-10-23 DIAGNOSIS — G620 Drug-induced polyneuropathy: Secondary | ICD-10-CM | POA: Insufficient documentation

## 2018-10-23 DIAGNOSIS — Z9221 Personal history of antineoplastic chemotherapy: Secondary | ICD-10-CM | POA: Insufficient documentation

## 2018-10-23 DIAGNOSIS — Z923 Personal history of irradiation: Secondary | ICD-10-CM

## 2018-10-23 DIAGNOSIS — I509 Heart failure, unspecified: Secondary | ICD-10-CM | POA: Diagnosis not present

## 2018-10-23 DIAGNOSIS — C9000 Multiple myeloma not having achieved remission: Secondary | ICD-10-CM

## 2018-10-23 DIAGNOSIS — Z9481 Bone marrow transplant status: Secondary | ICD-10-CM | POA: Diagnosis not present

## 2018-10-23 DIAGNOSIS — I429 Cardiomyopathy, unspecified: Secondary | ICD-10-CM

## 2018-10-23 LAB — CMP (CANCER CENTER ONLY)
ALT: 15 U/L (ref 0–44)
AST: 22 U/L (ref 15–41)
Albumin: 4 g/dL (ref 3.5–5.0)
Alkaline Phosphatase: 55 U/L (ref 38–126)
Anion gap: 14 (ref 5–15)
BUN: 18 mg/dL (ref 8–23)
CO2: 20 mmol/L — ABNORMAL LOW (ref 22–32)
Calcium: 9.3 mg/dL (ref 8.9–10.3)
Chloride: 105 mmol/L (ref 98–111)
Creatinine: 0.86 mg/dL (ref 0.44–1.00)
GFR, Est AFR Am: >60 mL/min (ref >60)
GFR, Estimated: >60 mL/min (ref >60)
Glucose, Bld: 119 mg/dL — ABNORMAL HIGH (ref 70–99)
Potassium: 4.2 mmol/L (ref 3.5–5.1)
Sodium: 139 mmol/L (ref 135–145)
Total Bilirubin: 0.6 mg/dL (ref 0.3–1.2)
Total Protein: 6.7 g/dL (ref 6.5–8.1)

## 2018-10-23 LAB — CBC WITH DIFFERENTIAL (CANCER CENTER ONLY)
Abs Immature Granulocytes: 0.01 10*3/uL (ref 0.00–0.07)
Basophils Absolute: 0.1 10*3/uL (ref 0.0–0.1)
Basophils Relative: 2 %
Eosinophils Absolute: 0.2 10*3/uL (ref 0.0–0.5)
Eosinophils Relative: 5 %
HCT: 38.5 % (ref 36.0–46.0)
Hemoglobin: 12.3 g/dL (ref 12.0–15.0)
Immature Granulocytes: 0 %
Lymphocytes Relative: 14 %
Lymphs Abs: 0.5 10*3/uL — ABNORMAL LOW (ref 0.7–4.0)
MCH: 34.7 pg — ABNORMAL HIGH (ref 26.0–34.0)
MCHC: 31.9 g/dL (ref 30.0–36.0)
MCV: 108.8 fL — ABNORMAL HIGH (ref 80.0–100.0)
Monocytes Absolute: 0.2 10*3/uL (ref 0.1–1.0)
Monocytes Relative: 6 %
Neutro Abs: 2.6 10*3/uL (ref 1.7–7.7)
Neutrophils Relative %: 73 %
Platelet Count: 165 10*3/uL (ref 150–400)
RBC: 3.54 MIL/uL — ABNORMAL LOW (ref 3.87–5.11)
RDW: 14.7 % (ref 11.5–15.5)
WBC Count: 3.5 10*3/uL — ABNORMAL LOW (ref 4.0–10.5)
nRBC: 0 % (ref 0.0–0.2)

## 2018-10-23 MED ORDER — MONTELUKAST SODIUM 10 MG PO TABS
ORAL_TABLET | ORAL | Status: AC
  Filled 2018-10-23: qty 1

## 2018-10-23 MED ORDER — ACETAMINOPHEN 325 MG PO TABS
650.0000 mg | ORAL_TABLET | Freq: Once | ORAL | Status: AC
  Administered 2018-10-23: 15:00:00 650 mg via ORAL

## 2018-10-23 MED ORDER — SODIUM CHLORIDE 0.9 % IV SOLN
Freq: Once | INTRAVENOUS | Status: AC
  Administered 2018-10-23: 14:00:00 via INTRAVENOUS
  Filled 2018-10-23: qty 250

## 2018-10-23 MED ORDER — DIPHENHYDRAMINE HCL 25 MG PO CAPS
ORAL_CAPSULE | ORAL | Status: AC
  Filled 2018-10-23: qty 2

## 2018-10-23 MED ORDER — METHYLPREDNISOLONE SODIUM SUCC 125 MG IJ SOLR
INTRAMUSCULAR | Status: AC
  Filled 2018-10-23: qty 2

## 2018-10-23 MED ORDER — ACETAMINOPHEN 325 MG PO TABS
ORAL_TABLET | ORAL | Status: AC
  Filled 2018-10-23: qty 2

## 2018-10-23 MED ORDER — MONTELUKAST SODIUM 10 MG PO TABS
10.0000 mg | ORAL_TABLET | Freq: Once | ORAL | Status: AC
  Administered 2018-10-23: 10 mg via ORAL

## 2018-10-23 MED ORDER — METHYLPREDNISOLONE SODIUM SUCC 125 MG IJ SOLR
125.0000 mg | Freq: Once | INTRAMUSCULAR | Status: AC
  Administered 2018-10-23: 125 mg via INTRAVENOUS

## 2018-10-23 MED ORDER — SODIUM CHLORIDE 0.9 % IV SOLN
16.0000 mg/kg | Freq: Once | INTRAVENOUS | Status: AC
  Administered 2018-10-23: 900 mg via INTRAVENOUS
  Filled 2018-10-23: qty 40

## 2018-10-23 MED ORDER — DIPHENHYDRAMINE HCL 25 MG PO CAPS
50.0000 mg | ORAL_CAPSULE | Freq: Once | ORAL | Status: AC
  Administered 2018-10-23: 50 mg via ORAL

## 2018-10-23 NOTE — Telephone Encounter (Signed)
Scheduled appt per 4/10 los. °

## 2018-10-23 NOTE — Patient Instructions (Signed)
[facility] Discharge Instructions for Patients Receiving Chemotherapy  Today you received the following chemotherapy agents: Darzalex  To help prevent nausea and vomiting after your treatment, we encourage you to take your nausea medication as directed.    If you develop nausea and vomiting that is not controlled by your nausea medication, call the clinic.   BELOW ARE SYMPTOMS THAT SHOULD BE REPORTED IMMEDIATELY:  *FEVER GREATER THAN 100.5 F  *CHILLS WITH OR WITHOUT FEVER  NAUSEA AND VOMITING THAT IS NOT CONTROLLED WITH YOUR NAUSEA MEDICATION  *UNUSUAL SHORTNESS OF BREATH  *UNUSUAL BRUISING OR BLEEDING  TENDERNESS IN MOUTH AND THROAT WITH OR WITHOUT PRESENCE OF ULCERS  *URINARY PROBLEMS  *BOWEL PROBLEMS  UNUSUAL RASH Items with * indicate a potential emergency and should be followed up as soon as possible.  Feel free to call the clinic should you have any questions or concerns. The clinic phone number is [phone].  Please show the CHEMO ALERT CARD at check-in to the Emergency Department and triage nurse.   

## 2018-10-26 LAB — PROTEIN ELECTROPHORESIS, SERUM
A/G Ratio: 1.5 (ref 0.7–1.7)
Albumin ELP: 4 g/dL (ref 2.9–4.4)
Alpha-1-Globulin: 0.3 g/dL (ref 0.0–0.4)
Alpha-2-Globulin: 0.8 g/dL (ref 0.4–1.0)
Beta Globulin: 0.8 g/dL (ref 0.7–1.3)
Gamma Globulin: 0.7 g/dL (ref 0.4–1.8)
Globulin, Total: 2.6 g/dL (ref 2.2–3.9)
M-Spike, %: 0.3 g/dL — ABNORMAL HIGH
Total Protein ELP: 6.6 g/dL (ref 6.0–8.5)

## 2018-10-26 LAB — KAPPA/LAMBDA LIGHT CHAINS
Kappa free light chain: 26.9 mg/L — ABNORMAL HIGH (ref 3.3–19.4)
Kappa, lambda light chain ratio: 8.97 — ABNORMAL HIGH (ref 0.26–1.65)
Lambda free light chains: 3 mg/L — ABNORMAL LOW (ref 5.7–26.3)

## 2018-10-27 ENCOUNTER — Other Ambulatory Visit: Payer: Self-pay | Admitting: Cardiology

## 2018-10-27 LAB — IFE, DARA-SPECIFIC, SERUM
IgA: 5 mg/dL — ABNORMAL LOW (ref 64–422)
IgG (Immunoglobin G), Serum: 771 mg/dL (ref 586–1602)
IgM (Immunoglobulin M), Srm: 22 mg/dL — ABNORMAL LOW (ref 26–217)

## 2018-11-17 NOTE — Progress Notes (Signed)
[name]   [phone] [phone]) [phone]   Clinic Follow up Note   Patient Care Team: [name], MD as PCP - General (Family Medicine) [name], MD as Attending Physician (Obstetrics and Gynecology) [name], MD as Consulting Physician (Internal Medicine) [name], MD as Consulting Physician (Hematology) [name], MD as Consulting Physician (Ophthalmology) [name], MD as Consulting Physician (Cardiology) [name], MD as Consulting Physician (Dermatology) [name] (Dentistry) [name], DPM as Consulting Physician (Podiatry)  Date of Service:  11/19/2018  CHIEF COMPLAINT:  F/u on MM  SUMMARY OF ONCOLOGIC HISTORY: Oncology History   Multiple myeloma [facility])   Staging form: Multiple Myeloma, AJCC 6th Edition   - Clinical: Stage IIIA - Signed by [name], MD on 09/04/2013      Multiple myeloma ([place])   05/26/2012 Initial Diagnosis    Presenting IgG was 4,040 mg/dL on 06/05/2012 (IgA 35; IgM 34); kappa free light chain 34.4 mg/dL, lambda 0.00, kappa:lambda ratio of 34.75; SPEP with M-spike of 2.60.    06/30/2012 Imaging    Numerous lytic lesions throughout the calvarium.  Lytic lesion within the left lateral scapula.  Findings most compatible with metastases or myeloma. Multiple mid and lower thoracic compression fractures.  Slight compression through the endplates at L3.    45/40/9811 Bone Marrow Biopsy    Bone marrow biopsy showed 49% plasma cell. Normal classical cytogenetics; however, myeloma FISH panel showed 13q- (intermediate risk)        07/14/2012 - 07/27/2012 Radiation Therapy    Palliative radiation 20 Gy over 10 fractions between to thoracic spine cord compression and symptomatic left scapula lytic lesion.    08/10/2012 - 11/2012 Chemotherapy    Started SQ Velcade once weekly, 3 weeks on, 1 weeks off; daily Revlimid d1-21, 7 days off; and Dexamethasone 58m PO weekly.     02/12/2013 Bone Marrow Transplant    [facility]     06/14/2013 Tumor Marker    IgG  1830,  Kappa:lamba ratio, 1.69 (baseline was 34.75 or   M-spike 0.28 (baseline of 2.6 or  89.3% of baseline).      06/25/2013 -  Chemotherapy    Started zometa 3.5 mg monthly     09/01/2013 - 01/2014 Chemotherapy    Maintenance therapy with revlimid 532mdaily for 14 days and then off for 7 (decreased from 21 days on and 7 days off based on neutropenia). Stopped due to disease progression 01/2017    09/13/2013 - 09/17/2014 Hospital Admission    Hospital admission for pneumonia    12/20/2013 Treatment Plan Change    Maintenance therapy decreased to 2.5 mg daily for 21 days and then off for 7 days based on low counts.     01/25/2014 Tumor Marker    IgG 1360 M spike 0.5    03/01/2014 - 03/05/2014 Hospital Ad[facility], received thrombolytic therapy, cath showed 60% prox RCA, mid-distal RCA 50%, Echo normal, no stents.    04/11/2014 - 08/07/2014 Chemotherapy    Continue Revlimid at 2.5 mg 3 weeks on 1 week off    08/08/2014 - 08/13/2014 Hospital Admission    Hospital admission for pneumonia. Her Revlimid was held.    08/29/2014 - 09/14/2014 Chemotherapy    Maintenance Revlimid restarted    09/14/2014 - 09/17/2014 Hospital Admission    [place] for pneumonia [place]  Revlimid was held again.    11/13/2014 - 12/25/2015 Chemotherapy    Maintenance Revlimid restarted, changed to 2.5m daily, 2 weks on, 1 week off from 12/15/2014, stopped due to disease progression     01/24/2016 Progression    Patient is M protein has gradually increased to 1.0g, repeated a bone marrow biopsy showed plasma cell 10-30%    02/27/2016 - 03/07/2016 Chemotherapy    Pomalidomide 4 mg daily on day 1-21, dexamethasone 40 mg on day 1, 8 and 15, every 28 days, started on 02/27/2016, daratumumab weekly started on 03/07/2016, held  after first dose, due to hospitalization and a severe pancytopenia, pomalidomide was stopped afterwards     03/10/2016 - 03/29/2016 Hospital Admission    Pt was admitted for sepsis from pneumonia, and severe pancytopenia. She required ICU stay for a few days due to hypotension, developed b/l pleural effusion required diuretics and b/l thoracentesis. She also required blood and plt transfusion, and prolonged neupogen injection for severe neutropenia. She was discharged home after 3 weeks hospital stay     04/26/2016 -  Chemotherapy    Daratumumab restarted on 04/26/2016, Velcade 1.391mm2 and dexa 2050meekly start on 11/3, velcade held since 06/28/2016 due to CHF, dexa stopped on12/11/2017 due to high risk of fracture     08/26/2016 Echocardiogram    - Left ventricle: LVEF is approximately 35% with diffuse diffuse   hypokinesis with severe hypokinesis/ akinesis of the   inferior/inferoseptal walls. In comparison to echo images from   December 2017, there does not appear to be significant change The   cavity size was normal. Wall thickness was normal. Doppler   parameters are consistent with abnormal left ventricular   relaxation (grade 1 diastolic dysfunction). - Aortic valve: AV is thickened. 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 Present in echo from December 2017 There was   trivial regurgitation. - Right ventricle: Systolic function was mildly reduced. - Right atrium: The atrium was mildly dilated. - Pericardium, extracardiac: A trivial pericardial effusion was   identified.    12/03/2016 Echocardiogram    -Left Ventricle: Systolic function was   mildly to moderately reduced. The estimated ejection fraction was   in the range of 40% to 45%. Diffuse hypokinesis. Doppler   parameters are consistent with abnormal left ventricular   relaxation (grade 1 diastolic dysfunction). Doppler parameters   are consistent with  indeterminate ventricular filling pressure. - Left atrium: The atrium was severely dilated. - Tricuspid valve: There was trivial regurgitation. - Pulmonary arteries: Systolic pressure was within the normal   range. PA peak pressure: 33 mm Hg (S). - Global longitudinal strain -10.5% (abnormal)    03/12/2017 Echocardiogram    ECHO 03/12/17 Study Conclusions - Left ventricle: The cavity size was normal. Wall thickness was   increased in a pattern of mild LVH. Systolic function was normal.   The estimated ejection fraction was in the range of 50% to 55%.   There is hypokinesis of the basalinferior myocardium. Doppler   parameters are consistent with abnormal left ventricular   relaxation (grade 1 diastolic dysfunction). - Aortic valve: There was trivial regurgitation. - Mitral valve: Calcified annulus. Mildly thickened leaflets .   There was trivial regurgitation. - Tricuspid valve: There was mild regurgitation. Impressions: - There has been mild improvement in EF since prior study.     12/12/2017 - 12/15/2017 Hospital Admission    Admission diagnosis: Pnuemonia and acute hypoxic respiratory failure  Additional comments: treated for pneumoania and acute hypoxic respiratory failure before she was discharged on 12/15/17 with oral anitbiotics. She did not require home oxygen.     12/30/2017 - 01/02/2018 Hospital Admission    Admission diagnosis: Pnuemonia  Additional comments: She was again hospitalized on 12/30/17 for pneumonia, ID work up was otherwise negative, she was discharged home with oral antibiotics.     04/01/2018 Imaging    04/01/2018 CXR IMPRESSION: Persisting airspace disease superimposed on chronic changes, suggesting pneumonia given the history. Followup PA and lateral chest X-ray is recommended in 3-4 weeks following therapy to ensure resolution and exclude underlying malignancy.  Redemonstration of mid and lower thoracic compression fractures with associated thoracic  kyphosis.    04/23/2018 Echocardiogram    04/23/2018 ECHO LV EF: 45% -   50%      CURRENT THERAPY:  Daratumumabevery 3 weeks. Dexamethasone a day before treatment.Dexa stopped in 1.2020 due to well controlled MM and high risk of fracture. Dara reduced to every 4 weeks due to COVID-19 starting 10/23/18.   INTERVAL HISTORY:  Shannon Rush is here for a follow up and treatment. She presents to the clinic today by herself. She notes she is doing well. She notes at least 10 days ago she had muffled hearing and full sensation of her left ear. She denies fever. She called nurse line and was advised to use decongestant. This did not help her. She feels this is related to her inner ear. She also not discomfort when laying her head down. She denies having this problem before. She notes mild nasal congestion just today, but not in the past 10 days. She notes her mild chronic cough, nothing else is new. She denies swimming or travel lately.     REVIEW OF SYSTEMS:   Constitutional: Denies fevers, chills or abnormal weight loss Eyes: Denies blurriness of vision Ears, nose, mouth, throat, and face: Denies mucositis or sore throat (+) Left ear discomfort, clogged.  Respiratory: Denies cough, dyspnea or wheezes Cardiovascular: Denies palpitation, chest discomfort or lower extremity swelling Gastrointestinal:  Denies nausea, heartburn or change in bowel habits Skin: Denies abnormal skin rashes Lymphatics: Denies new lymphadenopathy or easy bruising Neurological:Denies numbness, tingling or new weaknesses Behavioral/Psych: Mood is stable, no new changes  All other systems were reviewed with the patient and are negative.  MEDICAL HISTORY:  Past Medical History:  Diagnosis Date   CAD (coronary artery disease)    a. inf STEMI (in [place] >>> [facility]) >>> LHC (8/15- [place]):  pRCA 60%, mid to dist RCA 50% >>> med rx   Cardiomyopathy [facility])    Cataract    Chronic combined  systolic and diastolic CHF (congestive heart failure) (HCC)    Cord compression ([place]) 07/13/12   MRI- diffuse myeloma involvement of T-L spine   History of radiation therapy 07/13/12-07/27/12   spinal cord compression T3-T10,left scapula   Hx of echocardiogram    Echo (03/02/14-done at [facility]):  Normal LVEF without significant regional wall motion abnormalities. Mild    Multiple myeloma ([place]) 07/01/2012   MVP (mitral valve prolapse)    a. trivial by echo 06/2017.   OSTEOPOROSIS 06/11/2010   Multiple compression fractures; and spontaneous fracture of sternum Qualifier: Diagnosis of  By: [name], Susan      Thoracic kyphosis 07/13/12   per MRI scan   Unspecified deficiency anemia     SURGICAL HISTORY: Past Surgical History:  Procedure Laterality Date   APPENDECTOMY  CATARACT EXTRACTION, BILATERAL     CESAREAN SECTION     x2    COLONOSCOPY  2007   neg with Dr. [name]   ELBOW SURGERY     HIP SURGERY  2009   left   TUBAL LIGATION      I have reviewed the social history and family history with the patient and they are unchanged from previous note.  ALLERGIES:  is allergic to zithromax [azithromycin]; zosyn [piperacillin sod-tazobactam so]; and quinolones.  MEDICATIONS:  Current Outpatient Medications  Medication Sig Dispense Refill   acetaminophen (TYLENOL) 500 MG tablet Take 500 mg by mouth every 6 (six) hours as needed for moderate pain or fever.     acyclovir (ZOVIRAX) 400 MG tablet Take 1 tablet (400 mg total) by mouth 2 (two) times daily. 60 tablet 11   albuterol (PROVENTIL) (2.5 MG/3ML) 0.083% nebulizer solution Take 3 mLs (2.5 mg total) by nebulization every 6 (six) hours as needed for wheezing or shortness of breath. 75 mL 12   aspirin EC 81 MG tablet Take 1 tablet (81 mg total) by mouth daily. 90 tablet 3   atorvastatin (LIPITOR) 20 MG tablet Take 1 tablet (20 mg total) by mouth daily. 90 tablet 2   Calcium  Carbonate-Vit D-Min (CALCIUM 600+D PLUS MINERALS) 600-400 MG-UNIT TABS Take by mouth.     doxycycline (VIBRA-TABS) 100 MG tablet Take 100 mg by mouth 2 (two) times daily as needed.     metoprolol succinate (TOPROL-XL) 25 MG 24 hr tablet TAKE 1 TABLET BY MOUTH  DAILY 90 tablet 1   No current facility-administered medications for this visit.     PHYSICAL EXAMINATION: ECOG PERFORMANCE STATUS: 1 - Symptomatic but completely ambulatory  Vitals:   11/19/18 1146 11/19/18 1148  BP: (!) 148/92 (!) 153/86  Pulse: 69   Resp: 17   Temp: 97.7 F (36.5 C)   SpO2: 100%    Filed Weights   11/19/18 1146  Weight: 121 lb 11.2 oz (55.2 kg)    GENERAL:alert, no distress and comfortable SKIN: skin color, texture, turgor are normal, no rashes or significant lesions EARS: no discharge or redness, clear eardrum OROPHARYNX:no exudate, no erythema and lips, buccal mucosa, and tongue normal  NECK: supple, thyroid normal size, non-tender, without nodularity LYMPH:  no palpable lymphadenopathy in the cervical, axillary or inguinal LUNGS: clear to auscultation and percussion with normal breathing effort HEART: regular rate & rhythm and no murmurs and no lower extremity edema ABDOMEN:abdomen soft, non-tender and normal bowel sounds Musculoskeletal:no cyanosis of digits and no clubbing  NEURO: alert & oriented x 3 with fluent speech, no focal motor/sensory deficits  LABORATORY DATA:  I have reviewed the data as listed CBC Latest Ref Rng & Units 10/23/2018 09/24/2018 09/03/2018  WBC 4.0 - 10.5 K/uL 3.5(L) 3.3(L) 5.4  Hemoglobin 12.0 - 15.0 g/dL 12.3 10.7(L) 11.5(L)  Hematocrit 36.0 - 46.0 % 38.5 32.7(L) 35.8(L)  Platelets 150 - 400 K/uL 165 167 235     CMP Latest Ref Rng & Units 10/23/2018 09/24/2018 09/03/2018  Glucose 70 - 99 mg/dL 119(H) 87 92  BUN 8 - 23 mg/dL 18 13 18   Creatinine 0.44 - 1.00 mg/dL 0.86 0.84 0.87  Sodium 135 - 145 mmol/L 139 140 143  Potassium 3.5 - 5.1 mmol/L 4.2 4.1 3.8  Chloride  98 - 111 mmol/L 105 106 105  CO2 22 - 32 mmol/L 20(L) 23 24  Calcium 8.9 - 10.3 mg/dL 9.3 9.2 9.4  Total Protein 6.5 - 8.1 g/dL  6.7 6.0(L) 6.6  Total Bilirubin 0.3 - 1.2 mg/dL 0.6 0.8 0.6  Alkaline Phos 38 - 126 U/L 55 58 70  AST 15 - 41 U/L 22 20 19   ALT 0 - 44 U/L 15 12 12       RADIOGRAPHIC STUDIES: I have personally reviewed the radiological images as listed and agreed with the findings in the report. No results found.   ASSESSMENT & PLAN:  TALEIA SADOWSKI is a 78 y.o. female with   1. IgG kappa Multiple myeloma s/p Auto SCT, intermediate risk, relapsed in 01/2016 -Diagnosed in 05/2012. Treated with induction chemo, radiation and BM transplant, but unfortunately progressed while on maintenance Revlimid 01/2016. She is currently onDaratumumabinfusion every 3 weeksmonotherapy -Ipreviously stopped heroraldexadue to her high risk of fracture, and well controlled MM -Her recent M-protein has been 0.1-0.2, still positive on Dara-specific IFEwith low level. Last M-Protein at 0.3 on 10/23/18.  -I previously reviewed in detail COVID-19 precautions for her and given she is immunocompromised. She understands and I strongly encouraged her to continue social distancing and no traveling. She voiced good understanding. I have changed her treatment to every 4 weeks due to COVID-19 pandemic  -She is clinically doing well and stable.  Labs reviewed, CBC WNL except MCV 104.9, lymphocytes at 0.6, CMP WNL. Overall adequate to proceed with Dara infusion today.  -Her Latest SPEP from 10/2018 was slightly elevated M-protein (0.3) from her stable range. Will continue to monitor.  -F/u in 4 weeks. May return to every 3 weeks Dara after next infusion.   2. History of multiple pneumonia andbronchitis -She has had multiple episodes of pneumonia, required hospitalization,and bronchitis, required antibiotics -She has recovered well, will continue monitoring. I encouraged her to do spirometry,and  exercise. -Her vaccines are up-to-date.  3. Pancytopenia -Secondary to [place]. Currently resolved (11/19/18)  4. CHF, CAD, inferior STEMI in 02/2014, HLD -She is on ASA, BB, statin -f/u with Dr. Meda Coffee and continue medications -BP at 153/86 (11/19/18), will repeat in infusion room today  5. Osteoporosis with multiple fractures -Last DEXA in 2015 showed worsening osteoporosis. She declined repeat DEXA. -She had a fall induced fracture in 01/2018. -She was on Zometa for 4 years, stopped after April 2018  6. Peripheral neuropathy in feet, G1-2 -Secondary to previous chemo. -She previously declined Gabapentin  -I will monitor  7. Goal of care discussion  -she is full code now  8. Left Ear Discomfort -She notes feeling fullness of left ear and discomfort with laying head down -She has tried decongestant without relief.  -Exam unremarkable today, no signs of infection  -I encourage her to see her PCP for possible ENT referral    Plan -Lab reviewed, adequate for Daratumumab infusion today  -Lab, f/u and dara in 4 weeks     No problem-specific Assessment & Plan notes found for this encounter.   No orders of the defined types were placed in this encounter.  All questions were answered. The patient knows to call the clinic with any problems, questions or concerns. No barriers to learning was detected. I spent 20 minutes counseling the patient face to face. The total time spent in the appointment was 25 minutes and more than 50% was on counseling and review of test results     [name], MD 11/19/2018   I, Joslyn Devon, am acting as scribe for [name], MD.   I have reviewed the above documentation for accuracy and completeness, and I agree with the above.

## 2018-11-19 ENCOUNTER — Inpatient Hospital Stay: Payer: Medicare Other

## 2018-11-19 ENCOUNTER — Inpatient Hospital Stay (HOSPITAL_BASED_OUTPATIENT_CLINIC_OR_DEPARTMENT_OTHER): Payer: Medicare Other | Admitting: Hematology

## 2018-11-19 ENCOUNTER — Inpatient Hospital Stay: Payer: Medicare Other | Attending: Hematology

## 2018-11-19 ENCOUNTER — Other Ambulatory Visit: Payer: Self-pay

## 2018-11-19 VITALS — BP 153/86 | HR 69 | Temp 97.7°F | Resp 17 | Ht 67.0 in | Wt 121.7 lb

## 2018-11-19 VITALS — BP 104/72 | HR 68 | Temp 98.8°F | Resp 18

## 2018-11-19 DIAGNOSIS — I110 Hypertensive heart disease with heart failure: Secondary | ICD-10-CM | POA: Insufficient documentation

## 2018-11-19 DIAGNOSIS — Z9221 Personal history of antineoplastic chemotherapy: Secondary | ICD-10-CM | POA: Diagnosis not present

## 2018-11-19 DIAGNOSIS — T451X5S Adverse effect of antineoplastic and immunosuppressive drugs, sequela: Secondary | ICD-10-CM | POA: Insufficient documentation

## 2018-11-19 DIAGNOSIS — R0981 Nasal congestion: Secondary | ICD-10-CM

## 2018-11-19 DIAGNOSIS — I5042 Chronic combined systolic (congestive) and diastolic (congestive) heart failure: Secondary | ICD-10-CM | POA: Diagnosis not present

## 2018-11-19 DIAGNOSIS — Z9481 Bone marrow transplant status: Secondary | ICD-10-CM | POA: Diagnosis not present

## 2018-11-19 DIAGNOSIS — I429 Cardiomyopathy, unspecified: Secondary | ICD-10-CM

## 2018-11-19 DIAGNOSIS — I2510 Atherosclerotic heart disease of native coronary artery without angina pectoris: Secondary | ICD-10-CM | POA: Insufficient documentation

## 2018-11-19 DIAGNOSIS — Z5112 Encounter for antineoplastic immunotherapy: Secondary | ICD-10-CM | POA: Insufficient documentation

## 2018-11-19 DIAGNOSIS — Z8701 Personal history of pneumonia (recurrent): Secondary | ICD-10-CM

## 2018-11-19 DIAGNOSIS — G620 Drug-induced polyneuropathy: Secondary | ICD-10-CM | POA: Diagnosis not present

## 2018-11-19 DIAGNOSIS — C9000 Multiple myeloma not having achieved remission: Secondary | ICD-10-CM | POA: Diagnosis not present

## 2018-11-19 DIAGNOSIS — Z923 Personal history of irradiation: Secondary | ICD-10-CM | POA: Insufficient documentation

## 2018-11-19 DIAGNOSIS — I252 Old myocardial infarction: Secondary | ICD-10-CM | POA: Insufficient documentation

## 2018-11-19 DIAGNOSIS — Z7982 Long term (current) use of aspirin: Secondary | ICD-10-CM

## 2018-11-19 DIAGNOSIS — M810 Age-related osteoporosis without current pathological fracture: Secondary | ICD-10-CM

## 2018-11-19 DIAGNOSIS — M818 Other osteoporosis without current pathological fracture: Secondary | ICD-10-CM

## 2018-11-19 DIAGNOSIS — Z79899 Other long term (current) drug therapy: Secondary | ICD-10-CM | POA: Diagnosis not present

## 2018-11-19 LAB — CBC WITH DIFFERENTIAL (CANCER CENTER ONLY)
Abs Immature Granulocytes: 0.01 10*3/uL (ref 0.00–0.07)
Basophils Absolute: 0.1 10*3/uL (ref 0.0–0.1)
Basophils Relative: 1 %
Eosinophils Absolute: 0.2 10*3/uL (ref 0.0–0.5)
Eosinophils Relative: 5 %
HCT: 36.2 % (ref 36.0–46.0)
Hemoglobin: 12.1 g/dL (ref 12.0–15.0)
Immature Granulocytes: 0 %
Lymphocytes Relative: 15 %
Lymphs Abs: 0.6 10*3/uL — ABNORMAL LOW (ref 0.7–4.0)
MCH: 35.1 pg — ABNORMAL HIGH (ref 26.0–34.0)
MCHC: 33.4 g/dL (ref 30.0–36.0)
MCV: 104.9 fL — ABNORMAL HIGH (ref 80.0–100.0)
Monocytes Absolute: 0.5 10*3/uL (ref 0.1–1.0)
Monocytes Relative: 11 %
Neutro Abs: 2.9 10*3/uL (ref 1.7–7.7)
Neutrophils Relative %: 68 %
Platelet Count: 151 10*3/uL (ref 150–400)
RBC: 3.45 MIL/uL — ABNORMAL LOW (ref 3.87–5.11)
RDW: 14.5 % (ref 11.5–15.5)
WBC Count: 4.3 10*3/uL (ref 4.0–10.5)
nRBC: 0 % (ref 0.0–0.2)

## 2018-11-19 LAB — CMP (CANCER CENTER ONLY)
ALT: 16 U/L (ref 0–44)
AST: 22 U/L (ref 15–41)
Albumin: 4.1 g/dL (ref 3.5–5.0)
Alkaline Phosphatase: 53 U/L (ref 38–126)
Anion gap: 10 (ref 5–15)
BUN: 16 mg/dL (ref 8–23)
CO2: 25 mmol/L (ref 22–32)
Calcium: 9.6 mg/dL (ref 8.9–10.3)
Chloride: 106 mmol/L (ref 98–111)
Creatinine: 0.81 mg/dL (ref 0.44–1.00)
GFR, Est AFR Am: >60 mL/min
GFR, Estimated: >60 mL/min
Glucose, Bld: 90 mg/dL (ref 70–99)
Potassium: 4.2 mmol/L (ref 3.5–5.1)
Sodium: 141 mmol/L (ref 135–145)
Total Bilirubin: 0.6 mg/dL (ref 0.3–1.2)
Total Protein: 6.7 g/dL (ref 6.5–8.1)

## 2018-11-19 MED ORDER — MONTELUKAST SODIUM 10 MG PO TABS
10.0000 mg | ORAL_TABLET | Freq: Once | ORAL | Status: AC
  Administered 2018-11-19: 14:00:00 10 mg via ORAL

## 2018-11-19 MED ORDER — SODIUM CHLORIDE 0.9 % IV SOLN
Freq: Once | INTRAVENOUS | Status: AC
  Administered 2018-11-19: 14:00:00 via INTRAVENOUS
  Filled 2018-11-19: qty 250

## 2018-11-19 MED ORDER — MONTELUKAST SODIUM 10 MG PO TABS
ORAL_TABLET | ORAL | Status: AC
  Filled 2018-11-19: qty 1

## 2018-11-19 MED ORDER — SODIUM CHLORIDE 0.9 % IV SOLN
16.0000 mg/kg | Freq: Once | INTRAVENOUS | Status: AC
  Administered 2018-11-19: 15:00:00 900 mg via INTRAVENOUS
  Filled 2018-11-19: qty 40

## 2018-11-19 MED ORDER — METHYLPREDNISOLONE SODIUM SUCC 125 MG IJ SOLR
INTRAMUSCULAR | Status: AC
  Filled 2018-11-19: qty 2

## 2018-11-19 MED ORDER — ACETAMINOPHEN 325 MG PO TABS
650.0000 mg | ORAL_TABLET | Freq: Once | ORAL | Status: AC
  Administered 2018-11-19: 14:00:00 650 mg via ORAL

## 2018-11-19 MED ORDER — ACETAMINOPHEN 325 MG PO TABS
ORAL_TABLET | ORAL | Status: AC
  Filled 2018-11-19: qty 2

## 2018-11-19 MED ORDER — METHYLPREDNISOLONE SODIUM SUCC 125 MG IJ SOLR
125.0000 mg | Freq: Once | INTRAMUSCULAR | Status: AC
  Administered 2018-11-19: 14:00:00 125 mg via INTRAVENOUS

## 2018-11-19 MED ORDER — DIPHENHYDRAMINE HCL 25 MG PO CAPS
ORAL_CAPSULE | ORAL | Status: AC
  Filled 2018-11-19: qty 2

## 2018-11-19 MED ORDER — DIPHENHYDRAMINE HCL 25 MG PO CAPS
50.0000 mg | ORAL_CAPSULE | Freq: Once | ORAL | Status: AC
  Administered 2018-11-19: 50 mg via ORAL

## 2018-11-19 NOTE — Patient Instructions (Signed)
[facility] Discharge Instructions for Patients Receiving Chemotherapy  Today you received the following chemotherapy agents: Darzalex  To help prevent nausea and vomiting after your treatment, we encourage you to take your nausea medication as directed.    If you develop nausea and vomiting that is not controlled by your nausea medication, call the clinic.   BELOW ARE SYMPTOMS THAT SHOULD BE REPORTED IMMEDIATELY:  *FEVER GREATER THAN 100.5 F  *CHILLS WITH OR WITHOUT FEVER  NAUSEA AND VOMITING THAT IS NOT CONTROLLED WITH YOUR NAUSEA MEDICATION  *UNUSUAL SHORTNESS OF BREATH  *UNUSUAL BRUISING OR BLEEDING  TENDERNESS IN MOUTH AND THROAT WITH OR WITHOUT PRESENCE OF ULCERS  *URINARY PROBLEMS  *BOWEL PROBLEMS  UNUSUAL RASH Items with * indicate a potential emergency and should be followed up as soon as possible.  Feel free to call the clinic should you have any questions or concerns. The clinic phone number is [phone].  Please show the CHEMO ALERT CARD at check-in to the Emergency Department and triage nurse.   

## 2018-11-20 ENCOUNTER — Telehealth: Payer: Self-pay | Admitting: Hematology

## 2018-11-20 NOTE — Telephone Encounter (Signed)
No los per 5/7. °

## 2018-11-21 ENCOUNTER — Encounter: Payer: Self-pay | Admitting: Hematology

## 2018-11-24 ENCOUNTER — Other Ambulatory Visit: Payer: Self-pay

## 2018-11-24 ENCOUNTER — Encounter: Payer: Self-pay | Admitting: Family Medicine

## 2018-11-24 ENCOUNTER — Ambulatory Visit (INDEPENDENT_AMBULATORY_CARE_PROVIDER_SITE_OTHER): Payer: Medicare Other | Admitting: Family Medicine

## 2018-11-24 DIAGNOSIS — M2041 Other hammer toe(s) (acquired), right foot: Secondary | ICD-10-CM | POA: Insufficient documentation

## 2018-11-24 DIAGNOSIS — T148XXD Other injury of unspecified body region, subsequent encounter: Secondary | ICD-10-CM | POA: Diagnosis not present

## 2018-11-24 DIAGNOSIS — R42 Dizziness and giddiness: Secondary | ICD-10-CM | POA: Diagnosis not present

## 2018-11-24 NOTE — Assessment & Plan Note (Signed)
Left ear fullness and balance problems sound more inner ear.  Exam rules out outer ear problems and suggests middle ear is OK.  Could have an element of eustacian tube dysfunction.  Meclizine (low dose due to age) and flonase.

## 2018-11-24 NOTE — Assessment & Plan Note (Signed)
She is pretty sure she would not want surgery.  She may want a podiatry referral to discuss her options.  For now, she just wants spacers.

## 2018-11-24 NOTE — Patient Instructions (Addendum)
Let me know if you want me to put in a referral to the [facility].  Yes, try to find another spacer for your toes. Try bonine=meclizine 10 mg once a day for the inner ear problem.  (Prescription dose goes up to 50 mg 3 x per day.)  It is mildly sedating and increases fall risk.   See me

## 2018-11-24 NOTE — Assessment & Plan Note (Signed)
Good pulses, no vascular insufficiency.  Likely due to age and comorbid conditions.  Keep covered and moist.

## 2018-11-24 NOTE — Progress Notes (Signed)
Established Patient Office Visit  Subjective:  Patient ID: Shannon Rush, female    DOB: 07-Aug-1940  Age: 78 y.o. MRN: [id number]  CC:  Chief Complaint  Patient presents with  . Ear Fullness    HPI Shannon Rush presents for ear fullness and balance issues.  Patient has about 3 weeks of fullness of the left ear.  Hearing is a bit muffled.  She does have mild to moderate balance issues with it.  Does not say room spinning.  Seen by onc and told to make an appointment.  Did try nasal decongestant spray for 3 days without relief.  Does have nasal congestion.  No fever, cough or chest symptoms.  Second issue is hammar toe of right foot.  Causes some discomfort and has skin irritation of toe.  Also, poorly healing minor leg injuries.  She has been doing local care with neosporin intermitantly  Past Medical History:  Diagnosis Date  . CAD (coronary artery disease)    a. inf STEMI (in [place] >>> [facility]) >>> LHC (8/15- [place]):  pRCA 60%, mid to dist RCA 50% >>> med rx  . Cardiomyopathy ([place])   . Cataract   . Chronic combined systolic and diastolic CHF (congestive heart failure) ([place])   . Cord compression ([place]) 07/13/12   MRI- diffuse myeloma involvement of T-L spine  . History of radiation therapy 07/13/12-07/27/12   spinal cord compression T3-T10,left scapula  . Hx of echocardiogram    Echo (03/02/14-done at [facility]):  Normal LVEF without significant regional wall motion abnormalities. Mild   . Multiple myeloma ([place]) 07/01/2012  . MVP (mitral valve prolapse)    a. trivial by echo 06/2017.  . OSTEOPOROSIS 06/11/2010   Multiple compression fractures; and spontaneous fracture of sternum Qualifier: Diagnosis of  By: [name], [name]     . Thoracic kyphosis 07/13/12   per MRI scan  . Unspecified deficiency anemia     Past Surgical History:  Procedure Laterality Date  . APPENDECTOMY    . CATARACT EXTRACTION, BILATERAL    .  CESAREAN SECTION     x2   . COLONOSCOPY  2007   neg with Dr. [name]  . ELBOW SURGERY    . HIP SURGERY  2009   left  . TUBAL LIGATION      Family History  Problem Relation Age of Onset  . Glaucoma Mother   . Heart disease Mother   . Peripheral vascular disease Mother   . Raynaud syndrome Mother   . Heart disease Father   . Heart failure Father   . Glaucoma Father   . Cancer Brother        stage IV prostate CA  . Raynaud syndrome Daughter   . Raynaud syndrome Daughter     Social History   Socioeconomic History  . Marital status: Married    Spouse name: Shannon Rush  . Number of children: 2  . Years of education: Not on file  . Highest education level: Doctorate  Occupational History  . Occupation: Museum/gallery conservator: [employer]  . Financial resource strain: Not hard at all  . Food insecurity:    Worry: Never true    Inability: Never true  . Transportation needs:    Medical: No    Non-medical: No  Tobacco Use  . Smoking status: Never Smoker  . Smokeless tobacco: Never Used  Substance and Sexual Activity  . Alcohol  use: Yes    Alcohol/week: 7.0 standard drinks    Types: 7 Standard drinks or equivalent per week    Comment: 1 glass of wine or beer 3-5 days per week  . Drug use: No  . Sexual activity: Yes    Birth control/protection: Post-menopausal  Lifestyle  . Physical activity:    Days per week: 5 days    Minutes per session: 60 min  . Stress: Not at all  Relationships  . Social connections:    Talks on phone: More than three times a week    Gets together: More than three times a week    Attends religious service: Never    Active member of club or organization: Yes    Attends meetings of clubs or organizations: More than 4 times per year    Relationship status: Married  . Intimate partner violence:    Fear of current or ex partner: No    Emotionally abused: No    Physically abused: No    Forced sexual activity: No   Other Topics Concern  . Not on file  Social History Narrative   Who lives with you: spouse, 2 story home, does not have problems with the stairs, has walk in shower, handrails.   Any pets: Dog, Shannon Rush, black lab/boxer mix   Diet: Patient has a varied diet of protein, vegetables, starches.  Limits red meats.   Exercise: Patient exercises at least 3 times a week for over an hour.   Seatbelts: Patient reports wearing her seatbelt when in vehicle.    Shannon Rush Exposure/Protection: Patient reports wearing sunscreen daily.   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 Biked 5 miles this morning to this appointment.    Outpatient Medications Prior to Visit  Medication Sig Dispense Refill  . acetaminophen (TYLENOL) 500 MG tablet Take 500 mg by mouth every 6 (six) hours as needed for moderate pain or fever.    Marland Kitchen acyclovir (ZOVIRAX) 400 MG tablet Take 1 tablet (400 mg total) by mouth 2 (two) times daily. 60 tablet 11  . albuterol (PROVENTIL) (2.5 MG/3ML) 0.083% nebulizer solution Take 3 mLs (2.5 mg total) by nebulization every 6 (six) hours as needed for wheezing or shortness of breath. 75 mL 12  . aspirin EC 81 MG tablet Take 1 tablet (81 mg total) by mouth daily. 90 tablet 3  . atorvastatin (LIPITOR) 20 MG tablet Take 1 tablet (20 mg total) by mouth daily. Wade  tablet 2  . Calcium Carbonate-Vit D-Min (CALCIUM 600+D PLUS MINERALS) 600-400 MG-UNIT TABS  Take by mouth.    . doxycycline (VIBRA-TABS) 100 MG tablet Take 100 mg by mouth 2 (two) times daily as needed.    . metoprolol succinate (TOPROL-XL) 25 MG 24 hr tablet TAKE 1 TABLET BY MOUTH  DAILY 90 tablet 1   No facility-administered medications prior to visit.     Allergies  Allergen Reactions  . Zithromax [Azithromycin] Rash    Full body rash  . Zosyn [Piperacillin Sod-Tazobactam So] Swelling and Rash    Itching, rash and swelling of extremities Tolerates Rocephin  . Quinolones Rash    Cipro, Levaquin implicated Cipro also caused vomiting and abd pain    ROS Review of Systems    Objective:    Physical Exam  BP 122/70   Pulse 82   SpO2 97%  Wt Readings from Last 3 Encounters:  11/19/18 121 lb 11.2 oz (55.2 kg)  10/23/18 122 lb 12.8 oz (55.7 kg)  09/24/18 122 lb 1.6 oz (55.4 kg)   TMs normal, no retraction, no cerumen impaction. No sig nodes in neck Lungs clear Feet, good pulses bilaterally,  On right foot, second toe is hammer and overrides the third.  Great toe is laterally deviated.  There are no preventive care reminders to display for this patient.  There are no preventive care reminders to display for this patient.  Lab Results  Component Value Date   TSH 2.654 06/05/2009   Lab Results  Component Value Date   WBC 4.3 11/19/2018   HGB 12.1 11/19/2018   HCT 36.2 11/19/2018   MCV 104.9 (H) 11/19/2018   PLT 151 11/19/2018   Lab Results  Component Value Date   NA 141 11/19/2018   K 4.2 11/19/2018   CHLORIDE 106 07/18/2017   CO2 25 11/19/2018   GLUCOSE 90 11/19/2018   BUN 16 11/19/2018   CREATININE 0.81 11/19/2018   BILITOT 0.6 11/19/2018   ALKPHOS 53 11/19/2018   AST 22 11/19/2018   ALT 16 11/19/2018   PROT 6.7 11/19/2018   ALBUMIN 4.1 11/19/2018   CALCIUM 9.6 11/19/2018   ANIONGAP 10 11/19/2018   EGFR >60 07/18/2017   Lab Results  Component Value Date   CHOL 116 04/07/2018   Lab Results  Component Value Date   HDL 42 04/07/2018    Lab Results  Component Value Date   LDLCALC 57 04/07/2018   Lab Results  Component Value Date   TRIG 86 04/07/2018   Lab Results  Component Value Date   CHOLHDL 2.8 04/07/2018   Lab Results  Component Value Date   HGBA1C 5.2 12/31/2016      Assessment & Plan:   Problem List Items Addressed This Visit    None      No orders of the defined types were placed in this encounter.   Follow-up: No follow-ups on file.    [name], MD

## 2018-11-27 ENCOUNTER — Telehealth (HOSPITAL_COMMUNITY): Payer: Self-pay | Admitting: Radiology

## 2018-11-27 NOTE — Telephone Encounter (Signed)
COVID-19 Pre-Screening Questions:  . Do you currently have a fever?No (yes = cancel and refer to pcp for e-visit) . Have you recently travelled on a cruise, internationally, or to [place], [place], [place], [place], [place], or [place], [place] ([place]) ?NO (yes = cancel, stay home, monitor symptoms, and contact pcp or initiate e-visit if symptoms develop) . Have you been in contact with someone that is currently pending confirmation of Covid19 testing or has been confirmed to have the Covid19 virus?NO (yes = cancel, stay home, away from tested individual, monitor symptoms, and contact pcp or initiate e-visit if symptoms develop) . Are you currently experiencing fatigue or coughNO (yes = pt should be prepared to have a mask placed at the time of their visit). . Reiterated no additional visitors. . Arrive no earlier than 15 minutes before appointment time. . Please bring own mask.    

## 2018-12-01 ENCOUNTER — Ambulatory Visit (HOSPITAL_COMMUNITY): Payer: Medicare Other | Attending: Cardiology

## 2018-12-01 ENCOUNTER — Other Ambulatory Visit: Payer: Self-pay

## 2018-12-01 DIAGNOSIS — I2510 Atherosclerotic heart disease of native coronary artery without angina pectoris: Secondary | ICD-10-CM | POA: Diagnosis not present

## 2018-12-03 ENCOUNTER — Telehealth: Payer: Self-pay | Admitting: *Deleted

## 2018-12-03 DIAGNOSIS — Z7969 Long term (current) use of other immunomodulators and immunosuppressants: Secondary | ICD-10-CM

## 2018-12-03 DIAGNOSIS — I429 Cardiomyopathy, unspecified: Secondary | ICD-10-CM

## 2018-12-03 DIAGNOSIS — Z79899 Other long term (current) drug therapy: Secondary | ICD-10-CM

## 2018-12-03 DIAGNOSIS — I2510 Atherosclerotic heart disease of native coronary artery without angina pectoris: Secondary | ICD-10-CM

## 2018-12-03 NOTE — Telephone Encounter (Signed)
-----   Message from Charlie Pitter, [place] sent at 12/01/2018  5:08 PM EDT ----- Please let patient know echo is relatively stable compared to prior. Please schedule f/u echo in 3-4 months. She gets them regularly due to chemo. Dayna Dunn PA-C

## 2018-12-16 NOTE — Progress Notes (Signed)
Shannon Rush   [phone] Fax:([phone]) [phone]   Clinic Follow up Note   Patient Care Team: [name], MD as PCP - General (Family Medicine) [name], MD as Attending Physician (Obstetrics and Gynecology) [name], MD as Consulting Physician (Internal Medicine) [name], MD as Consulting Physician (Hematology) [name], MD as Consulting Physician (Ophthalmology) [name], MD as Consulting Physician (Cardiology) [name], MD as Consulting Physician (Dermatology) [name] (Dentistry) [name], DPM as Consulting Physician (Podiatry)  Date of Service:  12/17/2018  CHIEF COMPLAINT: F/u on MM  SUMMARY OF ONCOLOGIC HISTORY: Oncology History   Multiple myeloma [facility])   Staging form: Multiple Myeloma, AJCC 6th Edition   - Clinical: Stage IIIA - Signed by [name], MD on 09/04/2013      Multiple myeloma ([place] [place])   05/26/2012 Initial Diagnosis    Presenting IgG was 4,040 mg/dL on 06/05/2012 (IgA 35; IgM 34); kappa free light chain 34.4 mg/dL, lambda 0.00, kappa:lambda ratio of 34.75; SPEP with M-spike of 2.60.    06/30/2012 Imaging    Numerous lytic lesions throughout the calvarium.  Lytic lesion within the left lateral scapula.  Findings most compatible with metastases or myeloma. Multiple mid and lower thoracic compression fractures.  Slight compression through the endplates at L3.    50/53/9767 Bone Marrow Biopsy    Bone marrow biopsy showed 49% plasma cell. Normal classical cytogenetics; however, myeloma FISH panel showed 13q- (intermediate risk)        07/14/2012 - 07/27/2012 Radiation Therapy    Palliative radiation 20 Gy over 10 fractions between to thoracic spine cord compression and symptomatic left scapula lytic lesion.    08/10/2012 - 11/2012 Chemotherapy    Started SQ Velcade once weekly, 3 weeks on, 1 weeks off; daily Revlimid d1-21, 7 days off; and Dexamethasone 53m PO weekly.      02/12/2013 Bone Marrow Transplant    [facility]     06/14/2013 Tumor Marker    IgG  1830,  Kappa:lamba ratio, 1.69 (baseline was 34.75 or   M-spike 0.28 (baseline of 2.6 or  89.3% of baseline).      06/25/2013 -  Chemotherapy    Started zometa 3.5 mg monthly     09/01/2013 - 01/2014 Chemotherapy    Maintenance therapy with revlimid 537mdaily for 14 days and then off for 7 (decreased from 21 days on and 7 days off based on neutropenia). Stopped due to disease progression 01/2017    09/13/2013 - 09/17/2014 Hospital Admission    Hospital admission for pneumonia    12/20/2013 Treatment Plan Change    Maintenance therapy decreased to 2.5 mg daily for 21 days and then off for 7 days based on low counts.     01/25/2014 Tumor Marker    IgG 1360 M spike 0.5    03/01/2014 - 03/05/2014 Hospital Admission    [facility], received thrombolytic therapy, cath showed 60% prox RCA, mid-distal RCA 50%, Echo normal, no stents.    04/11/2014 - 08/07/2014 Chemotherapy    Continue Revlimid at 2.5 mg 3 weeks on 1 week off    08/08/2014 - 08/13/2014 Hospital Admission    Hospital admission for pneumonia. Her Revlimid was held.    08/29/2014 - 09/14/2014 Chemotherapy    Maintenance Revlimid restarted    09/14/2014 - 09/17/2014 Hospital Admission    Admitted for pneumonia [place]  Revlimid was held again.    11/13/2014 - 12/25/2015 Chemotherapy    Maintenance Revlimid restarted, changed to 2.62m daily, 2 weks on, 1 week off from 12/15/2014, stopped due to disease progression     01/24/2016 Progression    Patient is M protein has gradually increased to 1.0g, repeated a bone marrow biopsy showed plasma cell 10-30%    02/27/2016 - 03/07/2016 Chemotherapy    Pomalidomide 4 mg daily on day 1-21, dexamethasone 40 mg on day 1, 8 and 15, every 28 days, started on 02/27/2016, daratumumab weekly started on 03/07/2016, held  after first dose, due to hospitalization and a severe pancytopenia, pomalidomide was stopped afterwards     03/10/2016 - 03/29/2016 Hospital Admission    Pt was admitted for sepsis from pneumonia, and severe pancytopenia. She required ICU stay for a few days due to hypotension, developed b/l pleural effusion required diuretics and b/l thoracentesis. She also required blood and plt transfusion, and prolonged neupogen injection for severe neutropenia. She was discharged home after 3 weeks hospital stay     04/26/2016 -  Chemotherapy    Daratumumab restarted on 04/26/2016, Velcade 1.370mm2 and dexa 2061meekly start on 11/3, velcade held since 06/28/2016 due to CHF, dexa stopped on12/11/2017 due to high risk of fracture     08/26/2016 Echocardiogram    - Left ventricle: LVEF is approximately 35% with diffuse diffuse   hypokinesis with severe hypokinesis/ akinesis of the   inferior/inferoseptal walls. In comparison to echo images from   December 2017, there does not appear to be significant change The   cavity size was normal. Wall thickness was normal. Doppler   parameters are consistent with abnormal left ventricular   relaxation (grade 1 diastolic dysfunction). - Aortic valve: AV is thickened. 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 Present in echo from December 2017 There was   trivial regurgitation. - Right ventricle: Systolic function was mildly reduced. - Right atrium: The atrium was mildly dilated. - Pericardium, extracardiac: A trivial pericardial effusion was   identified.    12/03/2016 Echocardiogram    -Left Ventricle: Systolic function was   mildly to moderately reduced. The estimated ejection fraction was   in the range of 40% to 45%. Diffuse hypokinesis. Doppler   parameters are consistent with abnormal left ventricular   relaxation (grade 1 diastolic dysfunction). Doppler parameters   are consistent with  indeterminate ventricular filling pressure. - Left atrium: The atrium was severely dilated. - Tricuspid valve: There was trivial regurgitation. - Pulmonary arteries: Systolic pressure was within the normal   range. PA peak pressure: 33 mm Hg (S). - Global longitudinal strain -10.5% (abnormal)    03/12/2017 Echocardiogram    ECHO 03/12/17 Study Conclusions - Left ventricle: The cavity size was normal. Wall thickness was   increased in a pattern of mild LVH. Systolic function was normal.   The estimated ejection fraction was in the range of 50% to 55%.   There is hypokinesis of the basalinferior myocardium. Doppler   parameters are consistent with abnormal left ventricular   relaxation (grade 1 diastolic dysfunction). - Aortic valve: There was trivial regurgitation. - Mitral valve: Calcified annulus. Mildly thickened leaflets .   There was trivial regurgitation. - Tricuspid valve: There was mild regurgitation. Impressions: - There has been mild improvement in EF since prior study.     12/12/2017 - 12/15/2017 Hospital Admission    Admission diagnosis: Pnuemonia and acute hypoxic respiratory failure  Additional comments: treated for pneumoania and acute hypoxic respiratory failure before she was discharged on 12/15/17 with oral anitbiotics. She did not require home oxygen.     12/30/2017 - 01/02/2018 Hospital Admission    Admission diagnosis: Pnuemonia  Additional comments: She was again hospitalized on 12/30/17 for pneumonia, ID work up was otherwise negative, she was discharged home with oral antibiotics.     04/01/2018 Imaging    04/01/2018 CXR IMPRESSION: Persisting airspace disease superimposed on chronic changes, suggesting pneumonia given the history. Followup PA and lateral chest X-ray is recommended in 3-4 weeks following therapy to ensure resolution and exclude underlying malignancy.  Redemonstration of mid and lower thoracic compression fractures with associated thoracic  kyphosis.    04/23/2018 Echocardiogram    04/23/2018 ECHO LV EF: 45% -   50%      CURRENT THERAPY:  Daratumumabevery 3 weeks. Dexamethasone a day before treatment.Dexa stopped in 1.2020 due to well controlled MM and high risk of fracture. Dara reduced to every 4 weeks due to COVID-19 starting 10/23/18. Returned to every 3 weeks starting 12/17/18  INTERVAL HISTORY:  Shannon Rush is here for a follow up of MM. She presents to the clinic alone. She notes she is doing well. She notes still having left ear issues. Her hearing is not impaired but muffled slightly. She feels unsteady and nauseous due to this. She denies falling. She still wants to go on her [place] trip to the mountains. She is willing to take precautions.    REVIEW OF SYSTEMS:   Constitutional: Denies fevers, chills or abnormal weight loss Eyes: Denies blurriness of vision (+) left ear discomfort, unsteady on feet and nauseous Ears, nose, mouth, throat, and face: Denies mucositis or sore throat Respiratory: Denies cough, dyspnea or wheezes Cardiovascular: Denies palpitation, chest discomfort or lower extremity swelling Gastrointestinal:  Denies heartburn or change in bowel habits Skin: Denies abnormal skin rashes Lymphatics: Denies new lymphadenopathy or easy bruising Neurological:Denies numbness, tingling or new weaknesses Behavioral/Psych: Mood is stable, no new changes  All other systems were reviewed with the patient and are negative.  MEDICAL HISTORY:  Past Medical History:  Diagnosis Date  . CAD (coronary artery disease)    a. inf STEMI (in [place] >>> [facility]) >>> LHC (8/15- [place]):  pRCA 60%, mid to dist RCA 50% >>> med rx  . Cardiomyopathy ([place])   . Cataract   . Chronic combined systolic and diastolic CHF (congestive heart failure) ([place])   . Cord compression ([place]) 07/13/12   MRI- diffuse myeloma involvement of T-L spine  . History of radiation therapy 07/13/12-07/27/12   spinal cord  compression T3-T10,left scapula  . Hx of echocardiogram    Echo (03/02/14-done at [facility]):  Normal LVEF without significant regional wall motion abnormalities. Mild   . Multiple myeloma ([place]) 07/01/2012  . MVP (mitral valve prolapse)    a. trivial by echo 06/2017.  . OSTEOPOROSIS 06/11/2010   Multiple compression fractures; and spontaneous fracture of sternum Qualifier: Diagnosis of  By: [name], [name]     . Thoracic kyphosis 07/13/12   per MRI scan  . Unspecified deficiency anemia     SURGICAL HISTORY: Past Surgical History:  Procedure Laterality Date  . APPENDECTOMY    . CATARACT EXTRACTION, BILATERAL    . CESAREAN SECTION     x2   . COLONOSCOPY  2007   neg with Dr. [name]  . ELBOW SURGERY    . HIP SURGERY  2009   left  . TUBAL LIGATION      I have reviewed the social history and family history with the patient and they are unchanged from previous note.  ALLERGIES:  is allergic to zithromax [azithromycin]; zosyn [piperacillin sod-tazobactam so]; and quinolones.  MEDICATIONS:  Current Outpatient Medications  Medication Sig Dispense Refill  . acetaminophen (TYLENOL) 500 MG tablet Take 500 mg by mouth every 6 (six) hours as needed for moderate pain or fever.    Marland Kitchen acyclovir (ZOVIRAX) 400 MG tablet Take 1 tablet (400 mg total) by mouth 2 (two) times daily. 60 tablet 11  . albuterol (PROVENTIL) (2.5 MG/3ML) 0.083% nebulizer solution Take 3 mLs (2.5 mg total) by nebulization every 6 (six) hours as needed for wheezing or shortness of breath. 75 mL 12  . aspirin EC 81 MG tablet Take 1 tablet (81 mg total) by mouth daily. 90 tablet 3  . atorvastatin (LIPITOR) 20 MG tablet Take 1 tablet (20 mg total) by mouth daily. 90 tablet 2  . Calcium Carbonate-Vit D-Min (CALCIUM 600+D PLUS MINERALS) 600-400 MG-UNIT TABS Take by mouth.    . doxycycline (VIBRA-TABS) 100 MG tablet Take 100 mg by mouth 2 (two) times daily as needed.    . metoprolol succinate  (TOPROL-XL) 25 MG 24 hr tablet TAKE 1 TABLET BY MOUTH  DAILY 90 tablet 1   No current facility-administered medications for this visit.     PHYSICAL EXAMINATION: ECOG PERFORMANCE STATUS: 1 - Symptomatic but completely ambulatory  Vitals:   12/17/18 1117  BP: 126/88  Pulse: 70  Resp: 18  Temp: 97.6 F (36.4 C)  SpO2: 99%   Filed Weights   12/17/18 1117  Weight: 121 lb 14.4 oz (55.3 kg)    GENERAL:alert, no distress and comfortable SKIN: skin color, texture, turgor are normal, no rashes or significant lesions EYES: normal, Conjunctiva are pink and non-injected, sclera clear  NECK: supple, thyroid normal size, non-tender, without nodularity LYMPH:  no palpable lymphadenopathy in the cervical, axillary  LUNGS: clear to auscultation and percussion with normal breathing effort HEART: regular rate & rhythm and no murmurs and no lower extremity edema ABDOMEN:abdomen soft, non-tender and normal bowel sounds Musculoskeletal:no cyanosis of digits and no clubbing  NEURO: alert & oriented x 3 with fluent speech, no focal motor/sensory deficits  LABORATORY DATA:  I have reviewed the data as listed CBC Latest Ref Rng & Units 12/17/2018 11/19/2018 10/23/2018  WBC 4.0 - 10.5 K/uL 4.1 4.3 3.5(L)  Hemoglobin 12.0 - 15.0 g/dL 11.1(L) 12.1 12.3  Hematocrit 36.0 - 46.0 % 33.2(L) 36.2 38.5  Platelets 150 - 400 K/uL 148(L) 151 165     CMP Latest Ref Rng & Units 12/17/2018 11/19/2018 10/23/2018  Glucose 70 - 99 mg/dL 87 90 119(H)  BUN 8 - 23 mg/dL _0 Creatinine 0.44 - 1.00 mg/dL 0.84 0.81 0.86  Sodium 135 - 145 mmol/L 139 141 139  Potassium 3.5 - 5.1 mmol/L 4.0 4.2 4.2  Chloride 98 - 111 mmol/L 106 106 105  CO2 22 - 32 mmol/L 24 25 20(L)  Calcium 8.9 - 10.3 mg/dL 9.4 9.6 9.3  Total Protein 6.5 - 8.1 g/dL 6.3(L) 6.7 6.7  Total Bilirubin 0.3 - 1.2 mg/dL 0.7 0.6 0.6  Alkaline Phos 38 - 126 U/L 50 53 55  AST 15 - 41 U/L _1 ALT 0 - 44 U/L _2 RADIOGRAPHIC STUDIES: I have  personally reviewed the radiological images as  listed and agreed with the findings in the report. No results found.   ASSESSMENT & PLAN:  Shannon Rush is a 78 y.o. female with   1. IgG kappa Multiple myeloma s/p Auto SCT, intermediate risk, relapsed in 01/2016 -Diagnosed in 05/2012. Treated with induction chemo, radiation and BM transplant, but unfortunately progressed while on maintenance Revlimid 01/2016. She is currently onDaratumumabinfusion every 3 weeksmonotherapy -Ipreviouslystopped heroraldexadue to her high risk of fracture, and well controlled MM -Her recent M-protein has been 0.1-0.2, still positive on Dara-specific IFEwith low level. Last M-Protein at 0.3 on 10/23/18.  -I previously changed her treatment to every 4 weeks due to COVID-19 pandemic  -She is clinically doing well and stable. Labs reviewed, CBC WNL except Hg 11.1, PLT 148K, CMP WNL. MM panel still pending. Overall adequate to proceed with Dara infusion today.  -Her Latest SPEP from 10/2018 showed trending up kappa light chain level. Will change her Dara infusion back to every 3 weeks given recent elevations.  -She plans to travel [place] month. I encouraged her to avoid public or high trafficked places. I also encouraged her to continue COVID-19 precautions.  -F/u in 6 weeks.   2. History of multiple pneumonia andbronchitis -She has had multiple episodes of pneumonia, required hospitalization,and bronchitis, required antibiotics -She has recovered well, will continue monitoring. I encouraged her to do spirometry,and exercise. -Her vaccines are up-to-date.  3. Pancytopenia -Secondary to [place]. WBC normal, Hg 11.1, PLT 148K (12/17/18)  4. CHF, CAD, inferior STEMI in 02/2014, HLD -She is on ASA, BB, statin -f/u with Dr. Meda Coffee and continue medications -BP at 126/88 (12/17/18), will repeat in infusion room today  5. Osteoporosis with multiple fractures -Last DEXA in 2015  showed worsening osteoporosis. She declined repeat DEXA. -She had a fall induced fracture in 01/2018. -She was on Zometa for 4 years, stopped after April 2018  6. Peripheral neuropathy in feet, G1-2 -Secondary to previous chemo. -She previously declined Gabapentin  -I will monitor  7. Goal of care discussion  -she is full code now  8. Left Ear Discomfort -She notes feeling fullness of left ear and discomfort with laying head down along with being unsteady on feet and mild nausea.  -She has tried decongestants without relief.  -Prior exam did not indicate infections. Her PCP examined her with no new developments.  -I will refer her to ENT Dr. Lucia Gaskins   Plan -Send ENT referral  -Lab reviewed, adequate forDaratumumabinfusion today -Lab, and rapid dara infusion in 3 and 6 weeks  -F/u in 6 weeks  No problem-specific Assessment & Plan notes found for this encounter.   Orders Placed This Encounter  Procedures  . Ambulatory referral to ENT    Referral Priority:   Routine    Referral Type:   Consultation    Referral Reason:   Specialty Services Required    Requested Specialty:   Otolaryngology    Number of Visits Requested:   1   All questions were answered. The patient knows to call the clinic with any problems, questions or concerns. No barriers to learning was detected. I spent 20 minutes counseling the patient face to face. The total time spent in the appointment was 25 minutes and more than 50% was on counseling and review of test results     [name], MD 12/17/2018   I, Joslyn Devon, am acting as scribe for [name], MD.   I have reviewed the above documentation for accuracy and  completeness, and I agree with the above.

## 2018-12-17 ENCOUNTER — Inpatient Hospital Stay: Payer: Medicare Other | Attending: Hematology

## 2018-12-17 ENCOUNTER — Inpatient Hospital Stay: Payer: Medicare Other

## 2018-12-17 ENCOUNTER — Other Ambulatory Visit: Payer: Self-pay

## 2018-12-17 ENCOUNTER — Telehealth: Payer: Self-pay | Admitting: Hematology

## 2018-12-17 ENCOUNTER — Inpatient Hospital Stay (HOSPITAL_BASED_OUTPATIENT_CLINIC_OR_DEPARTMENT_OTHER): Payer: Medicare Other | Admitting: Hematology

## 2018-12-17 ENCOUNTER — Encounter: Payer: Self-pay | Admitting: Hematology

## 2018-12-17 VITALS — BP 126/88 | HR 70 | Temp 97.6°F | Resp 18 | Ht 67.0 in | Wt 121.9 lb

## 2018-12-17 VITALS — BP 116/72 | HR 71 | Temp 97.9°F | Resp 18

## 2018-12-17 DIAGNOSIS — C9000 Multiple myeloma not having achieved remission: Secondary | ICD-10-CM

## 2018-12-17 DIAGNOSIS — G629 Polyneuropathy, unspecified: Secondary | ICD-10-CM | POA: Insufficient documentation

## 2018-12-17 DIAGNOSIS — Z7982 Long term (current) use of aspirin: Secondary | ICD-10-CM | POA: Insufficient documentation

## 2018-12-17 DIAGNOSIS — E785 Hyperlipidemia, unspecified: Secondary | ICD-10-CM | POA: Diagnosis not present

## 2018-12-17 DIAGNOSIS — Z79899 Other long term (current) drug therapy: Secondary | ICD-10-CM | POA: Diagnosis not present

## 2018-12-17 DIAGNOSIS — Z9221 Personal history of antineoplastic chemotherapy: Secondary | ICD-10-CM

## 2018-12-17 DIAGNOSIS — C9002 Multiple myeloma in relapse: Secondary | ICD-10-CM

## 2018-12-17 DIAGNOSIS — Z923 Personal history of irradiation: Secondary | ICD-10-CM | POA: Insufficient documentation

## 2018-12-17 DIAGNOSIS — D6481 Anemia due to antineoplastic chemotherapy: Secondary | ICD-10-CM

## 2018-12-17 DIAGNOSIS — D61818 Other pancytopenia: Secondary | ICD-10-CM | POA: Diagnosis not present

## 2018-12-17 DIAGNOSIS — Z5112 Encounter for antineoplastic immunotherapy: Secondary | ICD-10-CM | POA: Diagnosis not present

## 2018-12-17 DIAGNOSIS — I252 Old myocardial infarction: Secondary | ICD-10-CM | POA: Insufficient documentation

## 2018-12-17 DIAGNOSIS — M810 Age-related osteoporosis without current pathological fracture: Secondary | ICD-10-CM | POA: Insufficient documentation

## 2018-12-17 DIAGNOSIS — M818 Other osteoporosis without current pathological fracture: Secondary | ICD-10-CM

## 2018-12-17 DIAGNOSIS — T451X5A Adverse effect of antineoplastic and immunosuppressive drugs, initial encounter: Secondary | ICD-10-CM

## 2018-12-17 LAB — CMP (CANCER CENTER ONLY)
ALT: 12 U/L (ref 0–44)
AST: 19 U/L (ref 15–41)
Albumin: 3.8 g/dL (ref 3.5–5.0)
Alkaline Phosphatase: 50 U/L (ref 38–126)
Anion gap: 9 (ref 5–15)
BUN: 18 mg/dL (ref 8–23)
CO2: 24 mmol/L (ref 22–32)
Calcium: 9.4 mg/dL (ref 8.9–10.3)
Chloride: 106 mmol/L (ref 98–111)
Creatinine: 0.84 mg/dL (ref 0.44–1.00)
GFR, Est AFR Am: >60 mL/min (ref >60)
GFR, Estimated: >60 mL/min (ref >60)
Glucose, Bld: 87 mg/dL (ref 70–99)
Potassium: 4 mmol/L (ref 3.5–5.1)
Sodium: 139 mmol/L (ref 135–145)
Total Bilirubin: 0.7 mg/dL (ref 0.3–1.2)
Total Protein: 6.3 g/dL — ABNORMAL LOW (ref 6.5–8.1)

## 2018-12-17 LAB — CBC WITH DIFFERENTIAL (CANCER CENTER ONLY)
Abs Immature Granulocytes: 0.01 10*3/uL (ref 0.00–0.07)
Basophils Absolute: 0.1 10*3/uL (ref 0.0–0.1)
Basophils Relative: 1 %
Eosinophils Absolute: 0.2 10*3/uL (ref 0.0–0.5)
Eosinophils Relative: 6 %
HCT: 33.2 % — ABNORMAL LOW (ref 36.0–46.0)
Hemoglobin: 11.1 g/dL — ABNORMAL LOW (ref 12.0–15.0)
Immature Granulocytes: 0 %
Lymphocytes Relative: 15 %
Lymphs Abs: 0.6 10*3/uL — ABNORMAL LOW (ref 0.7–4.0)
MCH: 35.4 pg — ABNORMAL HIGH (ref 26.0–34.0)
MCHC: 33.4 g/dL (ref 30.0–36.0)
MCV: 105.7 fL — ABNORMAL HIGH (ref 80.0–100.0)
Monocytes Absolute: 0.4 10*3/uL (ref 0.1–1.0)
Monocytes Relative: 11 %
Neutro Abs: 2.7 10*3/uL (ref 1.7–7.7)
Neutrophils Relative %: 67 %
Platelet Count: 148 10*3/uL — ABNORMAL LOW (ref 150–400)
RBC: 3.14 MIL/uL — ABNORMAL LOW (ref 3.87–5.11)
RDW: 14.2 % (ref 11.5–15.5)
WBC Count: 4.1 10*3/uL (ref 4.0–10.5)
nRBC: 0 % (ref 0.0–0.2)

## 2018-12-17 MED ORDER — SODIUM CHLORIDE 0.9 % IV SOLN
16.0000 mg/kg | Freq: Once | INTRAVENOUS | Status: AC
  Administered 2018-12-17: 900 mg via INTRAVENOUS
  Filled 2018-12-17: qty 40

## 2018-12-17 MED ORDER — METHYLPREDNISOLONE SODIUM SUCC 125 MG IJ SOLR
INTRAMUSCULAR | Status: AC
  Filled 2018-12-17: qty 2

## 2018-12-17 MED ORDER — MONTELUKAST SODIUM 10 MG PO TABS
10.0000 mg | ORAL_TABLET | Freq: Once | ORAL | Status: AC
  Administered 2018-12-17: 10 mg via ORAL

## 2018-12-17 MED ORDER — ACETAMINOPHEN 325 MG PO TABS
ORAL_TABLET | ORAL | Status: AC
  Filled 2018-12-17: qty 2

## 2018-12-17 MED ORDER — DIPHENHYDRAMINE HCL 25 MG PO CAPS
ORAL_CAPSULE | ORAL | Status: AC
  Filled 2018-12-17: qty 2

## 2018-12-17 MED ORDER — SODIUM CHLORIDE 0.9 % IV SOLN
Freq: Once | INTRAVENOUS | Status: AC
  Administered 2018-12-17: 13:00:00 via INTRAVENOUS
  Filled 2018-12-17: qty 250

## 2018-12-17 MED ORDER — ACETAMINOPHEN 325 MG PO TABS
650.0000 mg | ORAL_TABLET | Freq: Once | ORAL | Status: AC
  Administered 2018-12-17: 650 mg via ORAL

## 2018-12-17 MED ORDER — DIPHENHYDRAMINE HCL 25 MG PO CAPS
50.0000 mg | ORAL_CAPSULE | Freq: Once | ORAL | Status: AC
  Administered 2018-12-17: 50 mg via ORAL

## 2018-12-17 MED ORDER — METHYLPREDNISOLONE SODIUM SUCC 125 MG IJ SOLR
125.0000 mg | Freq: Once | INTRAMUSCULAR | Status: AC
  Administered 2018-12-17: 125 mg via INTRAVENOUS

## 2018-12-17 MED ORDER — MONTELUKAST SODIUM 10 MG PO TABS
ORAL_TABLET | ORAL | Status: AC
  Filled 2018-12-17: qty 1

## 2018-12-17 NOTE — Telephone Encounter (Signed)
Scheduled appt per 6/4 los. 

## 2018-12-18 LAB — IFE, DARA-SPECIFIC, SERUM
IgA: <5 mg/dL — ABNORMAL LOW (ref 64–422)
IgG (Immunoglobin G), Serum: 733 mg/dL (ref 586–1602)
IgM (Immunoglobulin M), Srm: 26 mg/dL (ref 26–217)

## 2018-12-18 LAB — KAPPA/LAMBDA LIGHT CHAINS
Kappa free light chain: 33.5 mg/L — ABNORMAL HIGH (ref 3.3–19.4)
Kappa, lambda light chain ratio: 12.88 — ABNORMAL HIGH (ref 0.26–1.65)
Lambda free light chains: 2.6 mg/L — ABNORMAL LOW (ref 5.7–26.3)

## 2018-12-22 ENCOUNTER — Telehealth (INDEPENDENT_AMBULATORY_CARE_PROVIDER_SITE_OTHER): Payer: Medicare Other | Admitting: Family Medicine

## 2018-12-22 DIAGNOSIS — C9002 Multiple myeloma in relapse: Secondary | ICD-10-CM

## 2018-12-22 DIAGNOSIS — G459 Transient cerebral ischemic attack, unspecified: Secondary | ICD-10-CM | POA: Diagnosis not present

## 2018-12-22 NOTE — Progress Notes (Signed)
Patient agrees to a phone visit.  Correspondence started with this email to me: Hello Dr. Mare Loan recall that I complained about some funny hearing stuff and also some dizziness and sometimes also nausea.  Dr Burr Medico referred me to Dr Lucia Gaskins, ENT.  Tonight for the second time, this time lasting several seconds, maybe close to a minute, the left side of my face tingled.  It did not hurt, although sometimes during the day there is some left face discomfort.  I wonder whether  neurologist would be a better choice.  What do you think? If so, whom would you recommend and could you refer me?  If not, should I stay with Lucia Gaskins?  He  can't see me until July 6 or 13th, depending on coordination with their audiologist.  I don't think hearing is the issue although the first thing I noticed was the sort of muffled hearing.  It doesn't affect acuity much I think, maybe not at all.  Advice if possible, and thanks.  I called.  She had complained about the same ear fullness my last visit - see my note of 11/24/2018.  Now has developed new symptoms of episodic left face tingling.  On aspirin and statin because of STEMI in 2015. She did not respond to bonine and does not really complain of room spinning.  She has had lowish BPs all her life and this feels different.  Of course, she has her multiple myeloma. Another clue might be that she develops lightheadedness when she looks up.  Duration of phone visit was 25 minutes.

## 2018-12-22 NOTE — Assessment & Plan Note (Signed)
No recent brain imaging.  CNS MEt is also in the differential 

## 2018-12-22 NOTE — Assessment & Plan Note (Signed)
Given known CAD, she is at risk for cerebrovascular disease.  Differential is extensive.  Labs are up to date.  Feel she needs imaging.  Also keep her ENT appointment.  May need a neuro appointment

## 2018-12-23 LAB — PROTEIN ELECTROPHORESIS, SERUM
A/G Ratio: 1.7 (ref 0.7–1.7)
Albumin ELP: 3.7 g/dL (ref 2.9–4.4)
Alpha-1-Globulin: 0.2 g/dL (ref 0.0–0.4)
Alpha-2-Globulin: 0.7 g/dL (ref 0.4–1.0)
Beta Globulin: 0.7 g/dL (ref 0.7–1.3)
Gamma Globulin: 0.6 g/dL (ref 0.4–1.8)
Globulin, Total: 2.2 g/dL (ref 2.2–3.9)
M-Spike, %: 0.3 g/dL — ABNORMAL HIGH
Total Protein ELP: 5.9 g/dL — ABNORMAL LOW (ref 6.0–8.5)

## 2019-01-01 ENCOUNTER — Telehealth: Payer: Self-pay

## 2019-01-01 NOTE — Telephone Encounter (Signed)
Spoke with pt advised that I spoke with [facility] and a sooner appointment was not available. Advised that [facility] has her information to call if there is a cancellation before scheduled appointment on 7/3. Pt verbalized understanding.

## 2019-01-01 NOTE — Telephone Encounter (Signed)
Patient calls stating she saw Dr. Andria Frames for the dizziness which she mentioned at Dr. Ernestina Penna last visit.  He does not know what is going on and ordered two different scans.  She is scheduled at [facility] on 7/3, but feels the dizziness is getting worse and she is concerned she will fall.  She is wondering if there is any way Dr. Burr Medico could get earlier scans?  [phone]

## 2019-01-01 NOTE — Telephone Encounter (Signed)
Spoke with [facility] to get an earlier scan appointment for pt. Pt scan is scheduled for 7/3.  Per [facility] a sooner appointment is not available and pt name is on the cancellation list. Per [facility] they will call pt if they have any cancellations before appointment on 7/3. I will contact pt and advise.

## 2019-01-05 ENCOUNTER — Other Ambulatory Visit: Payer: Self-pay | Admitting: *Deleted

## 2019-01-05 ENCOUNTER — Telehealth: Payer: Self-pay | Admitting: *Deleted

## 2019-01-05 DIAGNOSIS — G459 Transient cerebral ischemic attack, unspecified: Secondary | ICD-10-CM

## 2019-01-05 NOTE — Telephone Encounter (Signed)
PT was scheduled to have this done at GI and Kennyth Lose said pt called and she was going to have this done at [facility] on 01/07/2019 and needed the order to show this.  Placed new order and put [facility]le as site to have completed. Contacted GI to cancel appointments that are scheduled on 01/15/2019. Routing to PCP to be sure to sign off new orders. Nadie Fiumara Zimmerman Rumple, CMA

## 2019-01-06 ENCOUNTER — Encounter: Payer: Self-pay | Admitting: Hematology

## 2019-01-06 NOTE — Telephone Encounter (Signed)
Confirmed with scheduling that everything has been completed for pt to get her imaging done. April Zimmerman Rumple, CMA

## 2019-01-06 NOTE — Telephone Encounter (Signed)
I believe the orders are now signed.

## 2019-01-07 ENCOUNTER — Other Ambulatory Visit: Payer: Self-pay | Admitting: Family Medicine

## 2019-01-07 ENCOUNTER — Ambulatory Visit (HOSPITAL_COMMUNITY)
Admission: RE | Admit: 2019-01-07 | Discharge: 2019-01-07 | Disposition: A | Discharge status: Home / Self Care | Payer: Medicare Other | Source: Ambulatory Visit | Attending: Family Medicine | Admitting: Family Medicine

## 2019-01-07 ENCOUNTER — Inpatient Hospital Stay: Payer: Medicare Other

## 2019-01-07 ENCOUNTER — Encounter (HOSPITAL_COMMUNITY): Payer: Self-pay

## 2019-01-07 ENCOUNTER — Other Ambulatory Visit: Payer: Self-pay

## 2019-01-07 VITALS — BP 121/70 | HR 73 | Temp 97.8°F | Resp 17 | Ht 68.0 in | Wt 121.2 lb

## 2019-01-07 DIAGNOSIS — G629 Polyneuropathy, unspecified: Secondary | ICD-10-CM | POA: Diagnosis not present

## 2019-01-07 DIAGNOSIS — G459 Transient cerebral ischemic attack, unspecified: Secondary | ICD-10-CM

## 2019-01-07 DIAGNOSIS — M436 Torticollis: Secondary | ICD-10-CM | POA: Diagnosis not present

## 2019-01-07 DIAGNOSIS — Z5112 Encounter for antineoplastic immunotherapy: Secondary | ICD-10-CM | POA: Diagnosis not present

## 2019-01-07 DIAGNOSIS — I671 Cerebral aneurysm, nonruptured: Secondary | ICD-10-CM | POA: Diagnosis not present

## 2019-01-07 DIAGNOSIS — E785 Hyperlipidemia, unspecified: Secondary | ICD-10-CM | POA: Diagnosis not present

## 2019-01-07 DIAGNOSIS — Z79899 Other long term (current) drug therapy: Secondary | ICD-10-CM | POA: Diagnosis not present

## 2019-01-07 DIAGNOSIS — I252 Old myocardial infarction: Secondary | ICD-10-CM | POA: Diagnosis not present

## 2019-01-07 DIAGNOSIS — D61818 Other pancytopenia: Secondary | ICD-10-CM | POA: Diagnosis not present

## 2019-01-07 DIAGNOSIS — Z9221 Personal history of antineoplastic chemotherapy: Secondary | ICD-10-CM | POA: Diagnosis not present

## 2019-01-07 DIAGNOSIS — C9000 Multiple myeloma not having achieved remission: Secondary | ICD-10-CM | POA: Diagnosis not present

## 2019-01-07 DIAGNOSIS — M810 Age-related osteoporosis without current pathological fracture: Secondary | ICD-10-CM | POA: Diagnosis not present

## 2019-01-07 DIAGNOSIS — Z923 Personal history of irradiation: Secondary | ICD-10-CM | POA: Diagnosis not present

## 2019-01-07 DIAGNOSIS — Z7982 Long term (current) use of aspirin: Secondary | ICD-10-CM | POA: Diagnosis not present

## 2019-01-07 LAB — CMP (CANCER CENTER ONLY)
ALT: 12 U/L (ref 0–44)
AST: 20 U/L (ref 15–41)
Albumin: 4 g/dL (ref 3.5–5.0)
Alkaline Phosphatase: 49 U/L (ref 38–126)
Anion gap: 9 (ref 5–15)
BUN: 17 mg/dL (ref 8–23)
CO2: 24 mmol/L (ref 22–32)
Calcium: 9.2 mg/dL (ref 8.9–10.3)
Chloride: 107 mmol/L (ref 98–111)
Creatinine: 0.88 mg/dL (ref 0.44–1.00)
GFR, Est AFR Am: >60 mL/min (ref >60)
GFR, Estimated: >60 mL/min (ref >60)
Glucose, Bld: 83 mg/dL (ref 70–99)
Potassium: 4.1 mmol/L (ref 3.5–5.1)
Sodium: 140 mmol/L (ref 135–145)
Total Bilirubin: 0.6 mg/dL (ref 0.3–1.2)
Total Protein: 6.5 g/dL (ref 6.5–8.1)

## 2019-01-07 LAB — CBC WITH DIFFERENTIAL (CANCER CENTER ONLY)
Abs Immature Granulocytes: 0.01 10*3/uL (ref 0.00–0.07)
Basophils Absolute: 0 10*3/uL (ref 0.0–0.1)
Basophils Relative: 1 %
Eosinophils Absolute: 0.2 10*3/uL (ref 0.0–0.5)
Eosinophils Relative: 7 %
HCT: 35.4 % — ABNORMAL LOW (ref 36.0–46.0)
Hemoglobin: 11.8 g/dL — ABNORMAL LOW (ref 12.0–15.0)
Immature Granulocytes: 0 %
Lymphocytes Relative: 15 %
Lymphs Abs: 0.5 10*3/uL — ABNORMAL LOW (ref 0.7–4.0)
MCH: 35.1 pg — ABNORMAL HIGH (ref 26.0–34.0)
MCHC: 33.3 g/dL (ref 30.0–36.0)
MCV: 105.4 fL — ABNORMAL HIGH (ref 80.0–100.0)
Monocytes Absolute: 0.4 10*3/uL (ref 0.1–1.0)
Monocytes Relative: 12 %
Neutro Abs: 2.2 10*3/uL (ref 1.7–7.7)
Neutrophils Relative %: 65 %
Platelet Count: 144 10*3/uL — ABNORMAL LOW (ref 150–400)
RBC: 3.36 MIL/uL — ABNORMAL LOW (ref 3.87–5.11)
RDW: 13.8 % (ref 11.5–15.5)
WBC Count: 3.4 10*3/uL — ABNORMAL LOW (ref 4.0–10.5)
nRBC: 0 % (ref 0.0–0.2)

## 2019-01-07 MED ORDER — DIPHENHYDRAMINE HCL 25 MG PO CAPS
ORAL_CAPSULE | ORAL | Status: AC
  Filled 2019-01-07: qty 2

## 2019-01-07 MED ORDER — DIPHENHYDRAMINE HCL 25 MG PO CAPS
50.0000 mg | ORAL_CAPSULE | Freq: Once | ORAL | Status: AC
  Administered 2019-01-07: 50 mg via ORAL

## 2019-01-07 MED ORDER — SODIUM CHLORIDE 0.9 % IV SOLN
Freq: Once | INTRAVENOUS | Status: AC
  Administered 2019-01-07: 12:00:00 via INTRAVENOUS
  Filled 2019-01-07: qty 250

## 2019-01-07 MED ORDER — SODIUM CHLORIDE 0.9 % IV SOLN
16.0000 mg/kg | Freq: Once | INTRAVENOUS | Status: AC
  Administered 2019-01-07: 900 mg via INTRAVENOUS
  Filled 2019-01-07: qty 40

## 2019-01-07 MED ORDER — METHYLPREDNISOLONE SODIUM SUCC 125 MG IJ SOLR
125.0000 mg | Freq: Once | INTRAMUSCULAR | Status: AC
  Administered 2019-01-07: 125 mg via INTRAVENOUS

## 2019-01-07 MED ORDER — METHYLPREDNISOLONE SODIUM SUCC 125 MG IJ SOLR
INTRAMUSCULAR | Status: AC
  Filled 2019-01-07: qty 2

## 2019-01-07 MED ORDER — ACETAMINOPHEN 325 MG PO TABS
ORAL_TABLET | ORAL | Status: AC
  Filled 2019-01-07: qty 2

## 2019-01-07 MED ORDER — MONTELUKAST SODIUM 10 MG PO TABS
ORAL_TABLET | ORAL | Status: AC
  Filled 2019-01-07: qty 1

## 2019-01-07 MED ORDER — GADOBUTROL 1 MMOL/ML IV SOLN: 6.0000 mL | Freq: Once | INTRAVENOUS | Status: DC | PRN

## 2019-01-07 MED ORDER — MONTELUKAST SODIUM 10 MG PO TABS
10.0000 mg | ORAL_TABLET | Freq: Once | ORAL | Status: AC
  Administered 2019-01-07: 10 mg via ORAL

## 2019-01-07 MED ORDER — ACETAMINOPHEN 325 MG PO TABS
650.0000 mg | ORAL_TABLET | Freq: Once | ORAL | Status: AC
  Administered 2019-01-07: 650 mg via ORAL

## 2019-01-07 MED ORDER — GADOBUTROL 1 MMOL/ML IV SOLN
6.0000 mL | Freq: Once | INTRAVENOUS | Status: AC | PRN
  Administered 2019-01-07: 6 mL via INTRAVENOUS

## 2019-01-07 NOTE — Patient Instructions (Signed)
[facility] Discharge Instructions for Patients Receiving Chemotherapy  Today you received the following chemotherapy agents: Daratumumab (Darzalex)  To help prevent nausea and vomiting after your treatment, we encourage you to take your nausea medication as directed.   If you develop nausea and vomiting that is not controlled by your nausea medication, call the clinic.   BELOW ARE SYMPTOMS THAT SHOULD BE REPORTED IMMEDIATELY:  *FEVER GREATER THAN 100.5 F  *CHILLS WITH OR WITHOUT FEVER  NAUSEA AND VOMITING THAT IS NOT CONTROLLED WITH YOUR NAUSEA MEDICATION  *UNUSUAL SHORTNESS OF BREATH  *UNUSUAL BRUISING OR BLEEDING  TENDERNESS IN MOUTH AND THROAT WITH OR WITHOUT PRESENCE OF ULCERS  *URINARY PROBLEMS  *BOWEL PROBLEMS  UNUSUAL RASH Items with * indicate a potential emergency and should be followed up as soon as possible.  Feel free to call the clinic should you have any questions or concerns. The clinic phone number is [phone].  Please show the CHEMO ALERT CARD at check-in to the Emergency Department and triage nurse.  Coronavirus (COVID-19) Are you at risk?  Are you at risk for the Coronavirus (COVID-19)?  To be considered HIGH RISK for Coronavirus (COVID-19), you have to meet the following criteria:  . Traveled to [place], [place], [place], [place] or [place]; or in the [place] to [place], [place], [place], or [place]; and have fever, cough, and shortness of breath within the last 2 weeks of travel OR . Been in close contact with a person diagnosed with COVID-19 within the last 2 weeks and have fever, cough, and shortness of breath . IF YOU DO NOT MEET THESE CRITERIA, YOU ARE CONSIDERED LOW RISK FOR COVID-19.  What to do if you are HIGH RISK for COVID-19?  . If you are having a medical emergency, call 911. . Seek medical care right away. Before you go to a doctor's office, urgent care or emergency department, call ahead and tell them  about your recent travel, contact with someone diagnosed with COVID-19, and your symptoms. You should receive instructions from your physician's office regarding next steps of care.  . When you arrive at healthcare provider, tell the healthcare staff immediately you have returned from visiting [place], [place], [place], [place] or [place]; or traveled in the [place] to [place], [place], [place], or [place]; in the last two weeks or you have been in close contact with a person diagnosed with COVID-19 in the last 2 weeks.   . Tell the health care staff about your symptoms: fever, cough and shortness of breath. . After you have been seen by a medical provider, you will be either: o Tested for (COVID-19) and discharged home on quarantine except to seek medical care if symptoms worsen, and asked to  - Stay home and avoid contact with others until you get your results (4-5 days)  - Avoid travel on public transportation if possible (such as bus, train, or airplane) or o Sent to the Emergency Department by EMS for evaluation, COVID-19 testing, and possible admission depending on your condition and test results.  What to do if you are LOW RISK for COVID-19?  Reduce your risk of any infection by using the same precautions used for avoiding the common cold or flu:  . Wash your hands often with soap and warm water for at least 20 seconds.  If soap and water are not readily available, use an alcohol-based hand sanitizer with at least 60% alcohol.  . If coughing or   cover your mouth and nose by coughing or sneezing into the elbow areas of your shirt or coat, into a tissue or into your sleeve (not your hands). . Avoid shaking hands with others and consider head nods or verbal greetings only. . Avoid touching your eyes, nose, or mouth with unwashed hands.  . Avoid close contact with people who are sick. . Avoid places or events with large numbers of people in one location, like concerts or  sporting events. . Carefully consider travel plans you have or are making. . If you are planning any travel outside or inside the [place], visit the CDC's Travelers' Health webpage for the latest health notices. . If you have some symptoms but not all symptoms, continue to monitor at home and seek medical attention if your symptoms worsen. . If you are having a medical emergency, call 911.   [place] / e-Visit: [email or web address]         [facility]: [facility]: [phone]                   [facility]: [phone]

## 2019-01-08 ENCOUNTER — Telehealth: Payer: Self-pay | Admitting: *Deleted

## 2019-01-08 NOTE — Telephone Encounter (Signed)
TCT patient regarding lab results from 01/07/19. Spoke with patient. She states her main concern are the results of the MRIs she had done yesterday and what the plan will be. Pt is requesting a call back from Dr. Burr Medico and then appropriate appts can be scheduled.

## 2019-01-08 NOTE — Telephone Encounter (Signed)
-----   Message from [name], MD sent at 01/08/2019  7:51 AM EDT ----- Please let pt know her lab results, no concerns. I am OK with her request of future appointment schedule change. Our scheduler will call her, thanks  [name]  01/08/2019

## 2019-01-08 NOTE — Telephone Encounter (Signed)
I have called her and discussed her MRI findings with her in detail. Since she is symptomatic with worsening left ear fullness, I will refer her to rad/onc Dr. Tammi Klippel to discuss radiation therapy to her left petrous apex bone lesion. I also sent a email to Dr. [name] per pt's request.   [name] MD

## 2019-01-11 ENCOUNTER — Telehealth: Payer: Self-pay

## 2019-01-11 ENCOUNTER — Other Ambulatory Visit: Payer: Self-pay

## 2019-01-11 ENCOUNTER — Encounter: Payer: Self-pay | Admitting: Radiation Oncology

## 2019-01-11 ENCOUNTER — Ambulatory Visit
Admission: RE | Admit: 2019-01-11 | Discharge: 2019-01-11 | Disposition: A | Discharge status: Home / Self Care | Payer: Medicare Other | Source: Ambulatory Visit | Attending: Radiation Oncology | Admitting: Radiation Oncology

## 2019-01-11 VITALS — Ht 66.5 in | Wt 121.0 lb

## 2019-01-11 DIAGNOSIS — Z923 Personal history of irradiation: Secondary | ICD-10-CM | POA: Diagnosis not present

## 2019-01-11 DIAGNOSIS — C9002 Multiple myeloma in relapse: Secondary | ICD-10-CM | POA: Diagnosis not present

## 2019-01-11 DIAGNOSIS — C7951 Secondary malignant neoplasm of bone: Secondary | ICD-10-CM

## 2019-01-11 NOTE — Telephone Encounter (Signed)
Notified patient that Dr. Burr Medico wants to see her for follow up appointment tomorrow after CT simulation at 9:45, patient verbalized an understanding.

## 2019-01-11 NOTE — Progress Notes (Signed)
Histology and Location of Primary Cancer: Multiple myeloma diagnosed 2013  Sites of Visceral and Bony Metastatic Disease: left petrous apex  Location(s) of Symptomatic Metastases: large bone lesion left petrous apex  Past/Anticipated chemotherapy by medical oncology, if any:  Oncology History   Multiple myeloma ([facility])  Staging form: Multiple Myeloma, AJCC 6th Edition  - Clinical: Stage IIIA - Signed by [name], MD on 09/04/2013     Multiple myeloma ([facility])   05/26/2012 Initial Diagnosis    Presenting IgG was 4,040 mg/dL on 06/05/2012 (IgA 35; IgM 34); kappa free light chain 34.4 mg/dL, lambda 0.00, kappa:lambda ratio of 34.75; SPEP with M-spike of 2.60.    06/30/2012 Imaging    Numerous lytic lesions throughout the calvarium. Lytic lesion within the left lateral scapula. Findings most compatible with metastases or myeloma. Multiple mid and lower thoracic compression fractures. Slight compression through the endplates at L3.    67/67/2094 Bone Marrow Biopsy    Bone marrow biopsy showed 49% plasma cell. Normal classical cytogenetics; however, myeloma FISH panel showed 13q- (intermediate risk)    07/14/2012 - 07/27/2012 Radiation Therapy    Palliative radiation 20 Gy over 10 fractions between to thoracic spine cord compression and symptomatic left scapula lytic lesion.    08/10/2012 - 11/2012 Chemotherapy    Started SQ Velcade once weekly, 3 weeks on, 1 weeks off; daily Revlimid d1-21, 7 days off; and Dexamethasone 63m PO weekly.    02/12/2013 Bone Marrow Transplant    [facility]   06/14/2013 Tumor Marker    IgG 1830, Kappa:lamba ratio, 1.69 (baseline was 34.75 or M-spike 0.28 (baseline of 2.6 or 89.3% of baseline).    06/25/2013 -  Chemotherapy    Started zometa 3.5 mg monthly    09/01/2013 - 01/2014 Chemotherapy    Maintenance therapy with revlimid 558mdaily for 14 days and then off for 7 (decreased from 21 days on and 7 days off based on neutropenia). Stopped due to  disease progression 01/2017    09/13/2013 - 09/17/2014 Hospital Admission    Hospital admission for pneumonia    12/20/2013 Treatment Plan Change    Maintenance therapy decreased to 2.5 mg daily for 21 days and then off for 7 days based on low counts.    01/25/2014 Tumor Marker    IgG 1360 M spike 0.5    03/01/2014 - 03/05/2014 Hospital Admission    [facility] with an inferior STEMI, received thrombolytic therapy, cath showed 60% prox RCA, mid-distal RCA 50%, Echo normal, no stents.    04/11/2014 - 08/07/2014 Chemotherapy    Continue Revlimid at 2.5 mg 3 weeks on 1 week off    08/08/2014 - 08/13/2014 Hospital Admission    Hospital admission for pneumonia. Her Revlimid was held.    08/29/2014 - 09/14/2014 Chemotherapy    Maintenance Revlimid restarted    09/14/2014 - 09/17/2014 Hospital Admission    [place]    11/13/2014 - 12/25/2015 Chemotherapy    Maintenance Revlimid restarted, changed to 2.63m69maily, 2 weks on, 1 week off from 12/15/2014, stopped due to disease progression    01/24/2016 Progression    Patient is M protein has gradually increased to 1.0g, repeated a bone marrow biopsy showed plasma cell 10-30%    02/27/2016 - 03/07/2016 Chemotherapy    Pomalidomide 4 mg daily on day 1-21, dexamethasone 40 mg on day 1, 8 and  15, every 28 days, started on 02/27/2016, daratumumab weekly started on 03/07/2016, held after first dose, due to hospitalization and a severe pancytopenia, pomalidomide was stopped afterwards    03/10/2016 - 03/29/2016 Hospital Admission    Pt was admitted for sepsis from pneumonia, and severe pancytopenia. She required ICU stay for a few days due to hypotension, developed b/l pleural effusion required diuretics and b/l thoracentesis. She also required blood and plt transfusion, and prolonged neupogen injection for severe neutropenia. She was discharged home after 3 weeks hospital stay     04/26/2016 -  Chemotherapy    Daratumumab restarted on 04/26/2016, Velcade 1.74m/m2 and dexa 246mweekly start on 11/3, velcade held since 06/28/2016 due to CHF, dexa stopped on12/11/2017 due to high risk of fracture    08/26/2016 Echocardiogram    - Left ventricle: LVEF is approximately 35% with diffuse diffuse  hypokinesis with severe hypokinesis/ akinesis of the  inferior/inferoseptal walls. In comparison to echo images from  December 2017, there does not appear to be significant change The  cavity size was normal. Wall thickness was normal. Doppler  parameters are consistent with abnormal left ventricular  relaxation (grade 1 diastolic dysfunction).  - Aortic valve: AV is thickened. 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 Present in echo from December 2017 There was  trivial regurgitation.  - Right ventricle: Systolic function was mildly reduced.  - Right atrium: The atrium was mildly dilated.  - Pericardium, extracardiac: A trivial pericardial effusion was  identified.    12/03/2016 Echocardiogram    -Left Ventricle: Systolic function was  mildly to moderately reduced. The estimated ejection fraction was  in the range of 40% to 45%. Diffuse hypokinesis. Doppler  parameters are consistent with abnormal left ventricular  relaxation (grade 1 diastolic dysfunction). Doppler parameters  are consistent with indeterminate ventricular filling pressure.  - Left atrium: The atrium was severely dilated.  - Tricuspid valve: There was trivial regurgitation.  - Pulmonary arteries: Systolic pressure was within the normal  range. PA peak pressure: 33 mm Hg (S).  - Global longitudinal strain -10.5% (abnormal)    03/12/2017 Echocardiogram    ECHO 03/12/17 Study Conclusions  - Left ventricle: The cavity size was normal. Wall thickness was  increased in a pattern of mild LVH. Systolic function was normal.  The estimated ejection fraction  was in the range of 50% to 55%.  There is hypokinesis of the basalinferior myocardium. Doppler  parameters are consistent with abnormal left ventricular  relaxation (grade 1 diastolic dysfunction).  - Aortic valve: There was trivial regurgitation.  - Mitral valve: Calcified annulus. Mildly thickened leaflets .  There was trivial regurgitation.  - Tricuspid valve: There was mild regurgitation.  Impressions:  - There has been mild improvement in EF since prior study.    12/12/2017 - 12/15/2017 Hospital Admission    Admission diagnosis: Pnuemonia and acute hypoxic respiratory failure  Additional comments: treated for pneumoania and acute hypoxic respiratory failure before she was discharged on 12/15/17 with oral anitbiotics. She did not require home oxygen.    12/30/2017 - 01/02/2018 Hospital Admission    Admission diagnosis: Pnuemonia  Additional comments: She was again hospitalized on 12/30/17 for pneumonia, ID work up was otherwise negative, she was discharged home with oral antibiotics.    04/01/2018 Imaging    04/01/2018 CXR  IMPRESSION:  Persisting airspace disease superimposed on chronic changes,  suggesting pneumonia given the history. Followup PA and lateral  chest X-ray is  recommended in 3-4 weeks following therapy to ensure  resolution and exclude underlying malignancy.  Redemonstration of mid and lower thoracic compression fractures with  associated thoracic kyphosis.    04/23/2018 Echocardiogram    04/23/2018 ECHO  LV EF: 45% - 50%      Pain on a scale of 0-10 is: left ear discomfort with laying down   If Spine Met(s), symptoms, if any, include:  Bowel/Bladder retention or incontinence (please describe): denies  Numbness or weakness in extremities (please describe): denies  Current Decadron regimen, if applicable: no  Loss of facial sensation/facial numbness: no numbness no loss of sensation. Endorses that on two occasions she looked up and felt what she assumes is her  facial nerve firing   Eye mobility: turn head far to left causes diplopia  Tinnitus/muffled hearing: mild ringing occasionally. Muffled hearing continues of her own voice. Patient reports she hears herself muffled. Denies that her hearing acuity has been diminished.   Headaches: mild on occasion  Double vision: turn head far to left causes diplopia  Balance: endorses an unsteady gait but denies falls.  Reports her left eye involuntarily while reading  Ambulatory status? Walker? Wheelchair?: Ambulatory  SAFETY ISSUES:  Prior radiation? yes, palliative radiation 20 Gy over 10 fractions between to thoracic spine cord compression and symptomatic left scapula lytic lesion.  Pacemaker/ICD? no  Possible current pregnancy? no  Is the patient on methotrexate? no  Current Complaints / other details: 78 year old female. Marry to [place]. 2 children. Dog named Braden. Retired Network engineer at [employer]. ENT: Lucia Gaskins.

## 2019-01-11 NOTE — Progress Notes (Signed)
See progress note under physician encounter. 

## 2019-01-11 NOTE — Progress Notes (Signed)
[place]   [phone]) [phone] [phone]   Clinic Follow up Note   Patient Care Team: [name], MD as PCP - General (Family Medicine) [name], MD as Attending Physician (Obstetrics and Gynecology) [name], MD as Consulting Physician (Internal Medicine) [name], MD as Consulting Physician (Hematology) [name], MD as Consulting Physician (Ophthalmology) [name], MD as Consulting Physician (Cardiology) [name], MD as Consulting Physician (Dermatology) [name] (Dentistry) [name], DPM as Consulting Physician (Podiatry) Pleasant, Eppie Gibson, RN as [place] Management Pleasant, Eppie Gibson, RN as [place] Management  Date of Service:  01/12/2019  CHIEF COMPLAINT: F/u on MM  SUMMARY OF ONCOLOGIC HISTORY: Oncology History Overview Note  Multiple myeloma [facility])   Staging form: Multiple Myeloma, AJCC 6th Edition   - Clinical: Stage IIIA - Signed by [name], MD on 09/04/2013    Multiple myeloma ([place])  05/26/2012 Initial Diagnosis   Presenting IgG was 4,040 mg/dL on 06/05/2012 (IgA 35; IgM 34); kappa free light chain 34.4 mg/dL, lambda 0.00, kappa:lambda ratio of 34.75; SPEP with M-spike of 2.60.   06/30/2012 Imaging   Numerous lytic lesions throughout the calvarium.  Lytic lesion within the left lateral scapula.  Findings most compatible with metastases or myeloma. Multiple mid and lower thoracic compression fractures.  Slight compression through the endplates at L3.   49/44/9675 Bone Marrow Biopsy   Bone marrow biopsy showed 49% plasma cell. Normal classical cytogenetics; however, myeloma FISH panel showed 13q- (intermediate risk)       07/14/2012 - 07/27/2012 Radiation Therapy   Palliative radiation 20 Gy over 10 fractions between to thoracic spine cord compression and symptomatic left scapula lytic lesion.   08/10/2012 - 11/2012  Chemotherapy   Started SQ Velcade once weekly, 3 weeks on, 1 weeks off; daily Revlimid d1-21, 7 days off; and Dexamethasone 6m PO weekly.    02/12/2013 Bone Marrow Transplant   [facility]    06/14/2013 Tumor Marker   IgG  1830,  Kappa:lamba ratio, 1.69 (baseline was 34.75 or   M-spike 0.28 (baseline of 2.6 or  89.3% of baseline).     06/25/2013 -  Chemotherapy   Started zometa 3.5 mg monthly    09/01/2013 - 01/2014 Chemotherapy   Maintenance therapy with revlimid 597mdaily for 14 days and then off for 7 (decreased from 21 days on and 7 days off based on neutropenia). Stopped due to disease progression 01/2017   09/13/2013 - 09/17/2014 Hospital Admission   Hospital admission for pneumonia   12/20/2013 Treatment Plan Change   Maintenance therapy decreased to 2.5 mg daily for 21 days and then off for 7 days based on low counts.    01/25/2014 Tumor Marker   IgG 1360 M spike 0.5   03/01/2014 - 03/05/2014 Hospital Ad[facility]   [facility], received thrombolytic therapy, cath showed 60% prox RCA, mid-distal RCA 50%, Echo normal, no stents.   04/11/2014 - 08/07/2014 Chemotherapy   Continue Revlimid at 2.5 mg 3 weeks on 1 week off   08/08/2014 - 08/13/2014 Hospital Admission   Hospital admission for pneumonia. Her Revlimid was held.   08/29/2014 - 09/14/2014 Chemotherapy   Maintenance Revlimid restarted   09/14/2014 - 09/17/2014 Hospital Admission   [place].   11/13/2014 - 12/25/2015 Chemotherapy   Maintenance Revlimid restarted,  changed to 2.65m daily, 2 weks on, 1 week off from 12/15/2014, stopped due to disease progression    01/24/2016 Progression   Patient is M protein has gradually increased to 1.0g, repeated a bone marrow biopsy showed plasma cell 10-30%   02/27/2016 - 03/07/2016 Chemotherapy   Pomalidomide 4 mg daily on day 1-21, dexamethasone  40 mg on day 1, 8 and 15, every 28 days, started on 02/27/2016, daratumumab weekly started on 03/07/2016, held after first dose, due to hospitalization and a severe pancytopenia, pomalidomide was stopped afterwards    03/10/2016 - 03/29/2016 Hospital Admission   Pt was admitted for sepsis from pneumonia, and severe pancytopenia. She required ICU stay for a few days due to hypotension, developed b/l pleural effusion required diuretics and b/l thoracentesis. She also required blood and plt transfusion, and prolonged neupogen injection for severe neutropenia. She was discharged home after 3 weeks hospital stay    04/26/2016 -  Chemotherapy   Daratumumab restarted on 04/26/2016, Velcade 1.345mm2 and dexa 2075meekly start on 11/3, velcade held since 06/28/2016 due to CHF, dexa stopped on12/11/2017 due to high risk of fracture    08/26/2016 Echocardiogram   - Left ventricle: LVEF is approximately 35% with diffuse diffuse   hypokinesis with severe hypokinesis/ akinesis of the   inferior/inferoseptal walls. In comparison to echo images from   December 2017, there does not appear to be significant change The   cavity size was normal. Wall thickness was normal. Doppler   parameters are consistent with abnormal left ventricular   relaxation (grade 1 diastolic dysfunction). - Aortic valve: AV is thickened. 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 Present in echo from December 2017 There was   trivial regurgitation. - Right ventricle: Systolic function was mildly reduced. - Right atrium: The atrium was mildly dilated. - Pericardium, extracardiac: A trivial pericardial effusion was   identified.   12/03/2016 Echocardiogram   -Left Ventricle: Systolic function was   mildly to moderately reduced. The estimated ejection fraction was   in the range of 40% to 45%. Diffuse hypokinesis. Doppler   parameters are consistent with abnormal left  ventricular   relaxation (grade 1 diastolic dysfunction). Doppler parameters   are consistent with indeterminate ventricular filling pressure. - Left atrium: The atrium was severely dilated. - Tricuspid valve: There was trivial regurgitation. - Pulmonary arteries: Systolic pressure was within the normal   range. PA peak pressure: 33 mm Hg (S). - Global longitudinal strain -10.5% (abnormal)   03/12/2017 Echocardiogram   ECHO 03/12/17 Study Conclusions - Left ventricle: The cavity size was normal. Wall thickness was   increased in a pattern of mild LVH. Systolic function was normal.   The estimated ejection fraction was in the range of 50% to 55%.   There is hypokinesis of the basalinferior myocardium. Doppler   parameters are consistent with abnormal left ventricular   relaxation (grade 1 diastolic dysfunction). - Aortic valve: There was trivial regurgitation. - Mitral valve: Calcified annulus. Mildly thickened leaflets .   There was trivial regurgitation. - Tricuspid valve: There was mild regurgitation. Impressions: - There has been mild improvement in EF since prior study.    12/12/2017 - 12/15/2017 Hospital Admission   Admission diagnosis: Pnuemonia and acute hypoxic respiratory failure Additional comments: treated for pneumoania and acute hypoxic respiratory failure before she was discharged on 12/15/17 with oral anitbiotics. She did not require home oxygen.    12/30/2017 - 01/02/2018 Hospital Admission  Admission diagnosis: Pnuemonia  Additional comments: She was again hospitalized on 12/30/17 for pneumonia, ID work up was otherwise negative, she was discharged home with oral antibiotics.    04/01/2018 Imaging   04/01/2018 CXR IMPRESSION: Persisting airspace disease superimposed on chronic changes, suggesting pneumonia given the history. Followup PA and lateral chest X-ray is recommended in 3-4 weeks following therapy to ensure resolution and exclude underlying malignancy.   Redemonstration of mid and lower thoracic compression fractures with associated thoracic kyphosis.   04/23/2018 Echocardiogram   04/23/2018 ECHO LV EF: 45% -   50%   01/07/2019 Imaging   MRI Brain 01/07/19 IMPRESSION: MRI HEAD IMPRESSION:   1. 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 Lesion closely approximates the left inner ear structures and involves the left Meckel's cave and left fifth cranial nerve, and most likely is the causative etiology for patient's underlying left ear and facial symptoms. Follow-up examination with postcontrast imaging suggested for complete evaluation. Inclusion of IAC sequences would likely be helpful for visualization. 2. Additional 2.7 cm lesion involving the right occipital calvarium with adjacent marrow signal abnormality, also likely related to multiple myeloma. Metastatic disease would be the primary differential consideration. 3. Underlying age-appropriate cerebral atrophy with mild chronic microvascular ischemic disease. No other acute intracranial abnormality.   MRA HEAD IMPRESSION:   1. Negative intracranial MRA with no large vessel occlusion, hemodynamically significant stenosis, or other acute vascular abnormality. 2. Petrous and cavernous left ICA is partially encased and surrounded by the left petrous apex lesion without significant irregularity or stenosis. 3. 2 mm right paraophthalmic aneurysm.   MRA NECK IMPRESSION:   Normal MRA of the neck with wide patency of the carotid and vertebral arteries bilaterally. No hemodynamically significant stenosis or other acute vascular abnormality identified.     Radiation Therapy   Radaition to Left petrous apex bone lesion 01/13/19-01/27/19      CURRENT THERAPY:  Daratumumabevery 3 weeks. Dexamethasone a day before treatment.Dexa stopped in 07/2018 due  to well controlled MM and high risk of fracture. Dara reduced to every 4 weeks due to COVID-19 starting 10/23/18.Returned to every 3 weeks starting 12/17/18 -Target RT to skull base bone lesion with Dr Tammi Klippel 01/13/19-01/27/19  INTERVAL HISTORY:  Shannon Rush is here for a follow up. She presents to the clinic alone. She notes she is stable and ready to proceed with RT. She notes she is still planning to go to [place] for family trip and may or may not need to self isolate after her return. Her left ear fullness is stable, no significant hear loss.    REVIEW OF SYSTEMS:   Constitutional: Denies fevers, chills or abnormal weight loss Eyes: Denies blurriness of vision Ears, nose, mouth, throat, and face: Denies mucositis or sore throat Respiratory: Denies cough, dyspnea or wheezes Cardiovascular: Denies palpitation, chest discomfort or lower extremity swelling Gastrointestinal:  Denies nausea, heartburn or change in bowel habits Skin: Denies abnormal skin rashes Lymphatics: Denies new lymphadenopathy or easy bruising Neurological:Denies numbness, tingling or new weaknesses Behavioral/Psych: Mood is stable, no new changes  All other systems were reviewed with the patient and are negative.  MEDICAL HISTORY:  Past Medical History:  Diagnosis Date  . CAD (coronary artery disease)    a. inf STEMI (in [place] >>> [facility]ytics) >>> LHC (8/15- [place]):  pRCA 60%, mid to dist RCA 50% >>> med rx  .  Cardiomyopathy ([place])   . Cataract   . Chronic combined systolic and diastolic CHF (congestive heart failure) ([place])   . Cord compression ([place]) 07/13/12   MRI- diffuse myeloma involvement of T-L spine  . History of radiation therapy 07/13/12-07/27/12   spinal cord compression T3-T10,left scapula  . Hx of echocardiogram    Echo (03/02/14-done at [facility]):  Normal LVEF without significant regional wall motion abnormalities. Mild   . Multiple myeloma ([place]) 07/01/2012   . MVP (mitral valve prolapse)    a. trivial by echo 06/2017.  . OSTEOPOROSIS 06/11/2010   Multiple compression fractures; and spontaneous fracture of sternum Qualifier: Diagnosis of  By: [name], [name]     . Thoracic kyphosis 07/13/12   per MRI scan  . Unspecified deficiency anemia     SURGICAL HISTORY: Past Surgical History:  Procedure Laterality Date  . APPENDECTOMY    . CATARACT EXTRACTION, BILATERAL    . CESAREAN SECTION     x2   . COLONOSCOPY  2007   neg with Dr. [name]  . ELBOW SURGERY    . HIP SURGERY  2009   left  . TUBAL LIGATION      I have reviewed the social history and family history with the patient and they are unchanged from previous note.  ALLERGIES:  is allergic to zithromax [azithromycin]; zosyn [piperacillin sod-tazobactam so]; and quinolones.  MEDICATIONS:  Current Outpatient Medications  Medication Sig Dispense Refill  . acetaminophen (TYLENOL) 500 MG tablet Take 500 mg by mouth every 6 (six) hours as needed for moderate pain or fever.    Marland Kitchen acyclovir (ZOVIRAX) 400 MG tablet Take 1 tablet (400 mg total) by mouth 2 (two) times daily. 60 tablet 11  . aspirin EC 81 MG tablet Take 1 tablet (81 mg total) by mouth daily. 90 tablet 3  . atorvastatin (LIPITOR) 20 MG tablet     . Calcium Carbonate-Vit D-Min (CALCIUM 600+D PLUS MINERALS) 600-400 MG-UNIT TABS Take by mouth.    . metoprolol succinate (TOPROL-XL) 25 MG 24 hr tablet      No current facility-administered medications for this visit.     PHYSICAL EXAMINATION: ECOG PERFORMANCE STATUS: 1 - Symptomatic but completely ambulatory  Vitals:   01/12/19 1033  BP: (!) 142/76  Pulse: 66  Resp: 18  Temp: 98.9 F (37.2 C)  SpO2: 97%   Filed Weights   01/12/19 1033  Weight: 120 lb 11.2 oz (54.7 kg)    GENERAL:alert, no distress and comfortable SKIN: skin color, texture, turgor are normal, no rashes or significant lesions NEURO: alert & oriented x 3 with fluent speech, no focal motor/sensory deficits   LABORATORY DATA:  I have reviewed the data as listed CBC Latest Ref Rng & Units 01/07/2019 12/17/2018 11/19/2018  WBC 4.0 - 10.5 K/uL 3.4(L) 4.1 4.3  Hemoglobin 12.0 - 15.0 g/dL 11.8(L) 11.1(L) 12.1  Hematocrit 36.0 - 46.0 % 35.4(L) 33.2(L) 36.2  Platelets 150 - 400 K/uL 144(L) 148(L) 151     CMP Latest Ref Rng & Units 01/07/2019 12/17/2018 11/19/2018  Glucose 70 - 99 mg/dL 83 87 90  BUN 8 - 23 mg/dL 17 18 16   Creatinine 0.44 - 1.00 mg/dL 0.88 0.84 0.81  Sodium 135 - 145 mmol/L 140 139 141  Potassium 3.5 - 5.1 mmol/L 4.1 4.0 4.2  Chloride 98 - 111 mmol/L 107 106 106  CO2 22 - 32 mmol/L 24 24 25   Calcium 8.9 - 10.3 mg/dL 9.2 9.4 9.6  Total Protein  6.5 - 8.1 g/dL 6.5 6.3(L) 6.7  Total Bilirubin 0.3 - 1.2 mg/dL 0.6 0.7 0.6  Alkaline Phos 38 - 126 U/L 49 50 53  AST 15 - 41 U/L 20 19 22   ALT 0 - 44 U/L 12 12 16       RADIOGRAPHIC STUDIES: I have personally reviewed the radiological images as listed and agreed with the findings in the report. No results found.   ASSESSMENT & PLAN:  DOSHA BROSHEARS is a 78 y.o. female with   1. IgG kappa Multiple myeloma s/p Auto SCT, intermediate risk, relapsed in 01/2016 -Diagnosed in 05/2012. Treated with induction chemo, radiation and BM transplant, but unfortunately progressed while on maintenance Revlimid 01/2016. She is currently onDaratumumabinfusion every 3 weeksmonotherapy -Ipreviouslystopped heroraldexadue to her high risk of fracture, and well controlled MM -Her recent M-protein has been 0.1-0.3, still positive on Dara-specific IFEwith low level. Last M-Protein at 0.3 on 10/23/18. -I previously changed her treatment to every 4 weeks due to COVID-19 pandemic, and changed back to every 3 weeks in early June  -she recently developed left ear fullness, MRI showed a 5.9 x 1.6 x 2.4 cm bone lesion centered at the left petrous Apex which likely caused her symptoms. This is caused by her MM. I reviewed her scan findings, and referred her to  rad.onc, she will start palliative radiation tomorrow, plan to complete on 7/15 -She is clinically stable. Recent labs show stable mild Pancytopenia, CMP WNL. Her MM has been stable overall, I do not think a change of treatment is necessary, but I may increase her Dara infusion back to every 2 weeks from next week, due to her slight disease progression.   -She is still considering attending her [place] family trip. I again discussed her risk of infection and advised her to continue COVID precautions and social distancing. She understands.  -F/u on 7/16 for her next Dara infusion   2. Left petrous apex bone lesion -Her inner ear issues and dizziness progressed and she developed mild balance issue and nausea  -Her MRI brain from 01/07/19 showed abnormal osseous lesion measuring up to approximately 5.9 cm centered at the left petrous apex. This most likely is the cause of her underlying left ear and balance symptoms.  -Given she is symptomatic, she has been seen by Dr Tammi Klippel and will undergo target RT 7/1-7/15 for treatment.    3. History of multiple pneumonia andbronchitis -She has had multiple episodes of pneumonia, required hospitalization,and bronchitis, required antibiotics -She has recovered well, will continue monitoring. I encouraged her to do spirometry,and exercise. -Her vaccines are up-to-date.  4. Pancytopenia -Secondary to [place].WBC 3.4, Hg 11.8, PLT 141K, ANC normal on 01/07/19  5. CHF, CAD, inferior STEMI in 02/2014, HLD -She is on ASA, BB, statin -f/u with Dr. Meda Coffee and continue medications -BP at 142/76 (01/12/19), will repeat in infusion room today  6. Osteoporosis with multiple fractures -Last DEXA in 2015 showed worsening osteoporosis. She declined repeat DEXA. -She had a fall induced fracture in 01/2018. -She was on Zometa for 4 years, stopped after April 2018  7. Peripheral neuropathy in feet, G1-2 -Secondary to previous chemo. -She previously declined  Gabapentin  -I will monitor  8. Goal of care discussion  -she is full code now    Plan -Proceed with RT 7/1-7/15 -Lab, f/u and Dara in 2 weeks 7/16   No problem-specific Assessment & Plan notes found for this encounter.   No orders of the defined types were placed in this  encounter.  All questions were answered. The patient knows to call the clinic with any problems, questions or concerns. No barriers to learning was detected. I spent 15 minutes counseling the patient face to face. The total time spent in the appointment was 20 minutes and more than 50% was on counseling and review of test results     [name], MD 01/12/2019   I, Joslyn Devon, am acting as scribe for [name], MD.   I have reviewed the above documentation for accuracy and completeness, and I agree with the above.

## 2019-01-11 NOTE — Progress Notes (Signed)
[place]         [phone]) [phone] ________________________________   Initial Outpatient Consultation - Conducted via Zoom video visit due to current COVID-19 concerns for limiting patient exposure  Name: Shannon Rush MRN: [id number]  Date: 01/11/2019  DOB: 26-Apr-1941  REFERRING PHYSICIAN: [name], MD  DIAGNOSIS: 78 yo woman with a history of multiple myeloma and a symptomatic 5.9 cm skull base lesion centered in the left petrous apex    ICD-10-CM   1. Secondary malignant neoplasm of bone and bone marrow (HCC)  C79.51    C79.52   2. Multiple myeloma in relapse [facility])  C90.02     HISTORY OF PRESENT ILLNESS::Shannon Rush is a 78 y.o. female who has previously been treated by me to the spine for multiple myeloma.  Here is a synopsis of her oncology history.  Oncology History Overview Note  Multiple myeloma [facility])   Staging form: Multiple Myeloma, AJCC 6th Edition   - Clinical: Stage IIIA - Signed by [name], MD on 09/04/2013    Multiple myeloma ([place])  05/26/2012 Initial Diagnosis   Presenting IgG was 4,040 mg/dL on 06/05/2012 (IgA 35; IgM 34); kappa free light chain 34.4 mg/dL, lambda 0.00, kappa:lambda ratio of 34.75; SPEP with M-spike of 2.60.   06/30/2012 Imaging   Numerous lytic lesions throughout the calvarium.  Lytic lesion within the left lateral scapula.  Findings most compatible with metastases or myeloma. Multiple mid and lower thoracic compression fractures.  Slight compression through the endplates at L3.   85/46/2703 Bone Marrow Biopsy   Bone marrow biopsy showed 49% plasma cell. Normal classical cytogenetics; however, myeloma FISH panel showed 13q- (intermediate risk)       07/14/2012 - 07/27/2012 Radiation Therapy   Palliative radiation 20 Gy over 10 fractions between to thoracic spine cord compression and symptomatic left scapula lytic lesion.   08/10/2012 - 11/2012 Chemotherapy   Started SQ Velcade once weekly, 3 weeks on, 1 weeks off;  daily Revlimid d1-21, 7 days off; and Dexamethasone 22m PO weekly.    02/12/2013 Bone Marrow Transplant   [facility]    06/14/2013 Tumor Marker   IgG  1830,  Kappa:lamba ratio, 1.69 (baseline was 34.75 or   M-spike 0.28 (baseline of 2.6 or  89.3% of baseline).     06/25/2013 -  Chemotherapy   Started zometa 3.5 mg monthly    09/01/2013 - 01/2014 Chemotherapy   Maintenance therapy with revlimid 526mdaily for 14 days and then off for 7 (decreased from 21 days on and 7 days off based on neutropenia). Stopped due to disease progression 01/2017   09/13/2013 - 09/17/2014 Hospital Admission   Hospital admission for pneumonia   12/20/2013 Treatment Plan Change   Maintenance therapy decreased to 2.5 mg daily for 21 days and then off for 7 days based on low counts.    01/25/2014 Tumor Marker   IgG 1360 M spike 0.5   03/01/2014 - 03/05/2014 Hospital Admission   [facility], received thrombolytic therapy, cath showed 60% prox RCA, mid-distal RCA 50%, Echo normal, no stents.   04/11/2014 - 08/07/2014 Chemotherapy   Continue Revlimid at 2.5 mg 3 weeks on 1 week off   08/08/2014 - 08/13/2014 Hospital Admission   Hospital admission for pneumonia. Her Revlimid was held.   08/29/2014 - 09/14/2014 Chemotherapy   Maintenance Revlimid restarted   09/14/2014 - 09/17/2014 Hospital Admission   Admitted for  pneumonia after coming back from a trip in [name]. Revlimid was held again.   11/13/2014 - 12/25/2015 Chemotherapy   Maintenance Revlimid restarted, changed to 2.'5mg'$  daily, 2 weks on, 1 week off from 12/15/2014, stopped due to disease progression    01/24/2016 Progression   Patient is M protein has gradually increased to 1.0g, repeated a bone marrow biopsy showed plasma cell 10-30%   02/27/2016 - 03/07/2016 Chemotherapy   Pomalidomide 4 mg daily on day 1-21, dexamethasone 40 mg on day 1, 8 and 15, every 28 days, started on 02/27/2016,  daratumumab weekly started on 03/07/2016, held after first dose, due to hospitalization and a severe pancytopenia, pomalidomide was stopped afterwards    03/10/2016 - 03/29/2016 Hospital Admission   Pt was admitted for sepsis from pneumonia, and severe pancytopenia. She required ICU stay for a few days due to hypotension, developed b/l pleural effusion required diuretics and b/l thoracentesis. She also required blood and plt transfusion, and prolonged neupogen injection for severe neutropenia. She was discharged home after 3 weeks hospital stay    04/26/2016 -  Chemotherapy   Daratumumab restarted on 04/26/2016, Velcade 1.'3mg'$ /m2 and dexa '20mg'$  weekly start on 11/3, velcade held since 06/28/2016 due to CHF, dexa stopped on12/11/2017 due to high risk of fracture    08/26/2016 Echocardiogram   - Left ventricle: LVEF is approximately 35% with diffuse diffuse   hypokinesis with severe hypokinesis/ akinesis of the   inferior/inferoseptal walls. In comparison to echo images from   December 2017, there does not appear to be significant change The   cavity size was normal. Wall thickness was normal. Doppler   parameters are consistent with abnormal left ventricular   relaxation (grade 1 diastolic dysfunction). - Aortic valve: AV is thickened. 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 Present in echo from December 2017 There was   trivial regurgitation. - Right ventricle: Systolic function was mildly reduced. - Right atrium: The atrium was mildly dilated. - Pericardium, extracardiac: A trivial pericardial effusion was   identified.   12/03/2016 Echocardiogram   -Left Ventricle: Systolic function was   mildly to moderately reduced. The estimated ejection fraction was   in the range of 40% to 45%. Diffuse hypokinesis. Doppler   parameters are consistent with abnormal left ventricular   relaxation (grade 1 diastolic dysfunction). Doppler  parameters   are consistent with indeterminate ventricular filling pressure. - Left atrium: The atrium was severely dilated. - Tricuspid valve: There was trivial regurgitation. - Pulmonary arteries: Systolic pressure was within the normal   range. PA peak pressure: 33 mm Hg (S). - Global longitudinal strain -10.5% (abnormal)   03/12/2017 Echocardiogram   ECHO 03/12/17 Study Conclusions - Left ventricle: The cavity size was normal. Wall thickness was   increased in a pattern of mild LVH. Systolic function was normal.   The estimated ejection fraction was in the range of 50% to 55%.   There is hypokinesis of the basalinferior myocardium. Doppler   parameters are consistent with abnormal left ventricular   relaxation (grade 1 diastolic dysfunction). - Aortic valve: There was trivial regurgitation. - Mitral valve: Calcified annulus. Mildly thickened leaflets .   There was trivial regurgitation. - Tricuspid valve: There was mild regurgitation. Impressions: - There has been mild improvement in EF since prior study.    12/12/2017 - 12/15/2017 Hospital Admission   Admission diagnosis: Pnuemonia and acute hypoxic respiratory failure Additional comments: treated for pneumoania and acute hypoxic  respiratory failure before she was discharged on 12/15/17 with oral anitbiotics. She did not require home oxygen.    12/30/2017 - 01/02/2018 Hospital Admission   Admission diagnosis: Pnuemonia  Additional comments: She was again hospitalized on 12/30/17 for pneumonia, ID work up was otherwise negative, she was discharged home with oral antibiotics.    04/01/2018 Imaging   04/01/2018 CXR IMPRESSION: Persisting airspace disease superimposed on chronic changes, suggesting pneumonia given the history. Followup PA and lateral chest X-ray is recommended in 3-4 weeks following therapy to ensure resolution and exclude underlying malignancy.  Redemonstration of mid and lower thoracic compression fractures  with associated thoracic kyphosis.   04/23/2018 Echocardiogram   04/23/2018 ECHO LV EF: 45% -   50%    CURRENT THERAPY:  Recently, she has been receiving Daratumumabevery 3 weeks. Dexamethasone a day before treatment.Dexa stopped in 1.2020 due to well controlled MM and high risk of fracture. Dara reduced to every 4 weeks due to COVID-19 starting 10/23/18.Returned to every 3 weeks starting 12/17/18  CURRENT ISSUE:  [place] RIOS saw Dr. Burr Medico on 6/4 for follow-up.  At that time, she noted still having left ear issues. Her hearing was not impaired but muffled slightly. She feels unsteady and nauseous due to this. So, she had MRI of head on 01/07/19.  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 Lesion closely approximates the left inner ear structures and involves the left Meckel's cave and left fifth cranial nerve, and most likely is the causative etiology for patient's underlying left ear and facial symptoms.     PREVIOUS RADIATION THERAPY: Yes s/p palliative radiotherapy to 20 gray in 10 fractions of 2 gray for thoracic spinal cord compression (T3-T10) and left scapular pain - 07/13/2012-07/27/2012   Past Medical History:  Diagnosis Date   CAD (coronary artery disease)    a. inf STEMI (in [place] >>> [facility]) >>> LHC (8/15- [place]):  pRCA 60%, mid to dist RCA 50% >>> med rx   Cardiomyopathy [facility])    Cataract    Chronic combined systolic and diastolic CHF (congestive heart failure) ([place])    Cord compression ([place]) 07/13/12   MRI- diffuse myeloma involvement of T-L spine   History of radiation therapy 07/13/12-07/27/12   spinal cord compression T3-T10,left scapula   Hx of echocardiogram    Echo (03/02/14-done at [facility]):  Normal LVEF without significant regional wall motion abnormalities.  Mild    Multiple myeloma ([place]) 07/01/2012   MVP (mitral valve prolapse)    a. trivial by echo 06/2017.   OSTEOPOROSIS 06/11/2010   Multiple compression fractures; and spontaneous fracture of sternum Qualifier: Diagnosis of  By: [name], Susan      Thoracic kyphosis 07/13/12   per MRI scan   Unspecified deficiency anemia   :   Past Surgical History:  Procedure Laterality Date   APPENDECTOMY     CATARACT EXTRACTION, BILATERAL     CESAREAN SECTION     x2    COLONOSCOPY  2007   neg with Dr. [name]   ELBOW SURGERY     HIP SURGERY  2009   left   TUBAL LIGATION    :   Current Outpatient Medications:    acyclovir (ZOVIRAX) 400 MG tablet, Take 1 tablet (400 mg total) by mouth 2 (two) times daily., Disp: 60 tablet, Rfl: 11  aspirin EC 81 MG tablet, Take 1 tablet (81 mg total) by mouth daily., Disp: 90 tablet, Rfl: 3   atorvastatin (LIPITOR) 20 MG tablet, , Disp: , Rfl:    Calcium Carbonate-Vit D-Min (CALCIUM 600+D PLUS MINERALS) 600-400 MG-UNIT TABS, Take by mouth., Disp: , Rfl:    metoprolol succinate (TOPROL-XL) 25 MG 24 hr tablet, , Disp: , Rfl:    acetaminophen (TYLENOL) 500 MG tablet, Take 500 mg by mouth every 6 (six) hours as needed for moderate pain or fever., Disp: , Rfl: :   Allergies  Allergen Reactions   Zithromax [Azithromycin] Rash    Full body rash   Zosyn [Piperacillin Sod-Tazobactam So] Swelling and Rash    Itching, rash and swelling of extremities Tolerates Rocephin   Quinolones Rash    Cipro, Levaquin implicated Cipro also caused vomiting and abd pain  :   Family History  Problem Relation Age of Onset   Glaucoma Mother    Heart disease Mother    Peripheral vascular disease Mother    Raynaud syndrome Mother    Heart disease Father    Heart failure Father    Glaucoma Father    Cancer Brother        stage IV prostate CA   Prostate cancer Brother    Raynaud syndrome Daughter    Raynaud syndrome Daughter    Breast  cancer Neg Hx    Colon cancer Neg Hx   :   Social History   Socioeconomic History   Marital status: Married    Spouse name: Fritz Pickerel   Number of children: 2   Years of education: Not on file   Highest education level: Doctorate  Occupational History   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 strain: Not hard at all   Food insecurity    Worry: Never true    Inability: Never true   Transportation needs    Medical: No    Non-medical: No  Tobacco Use   Smoking status: Never Smoker   Smokeless tobacco: Never Used  Substance and Sexual Activity   Alcohol use: Yes    Alcohol/week: 7.0 standard drinks    Types: 7 Standard drinks or equivalent per week    Comment: 1 glass of wine or beer 3-5 days per week   Drug use: No   Sexual activity: Yes    Birth control/protection: Post-menopausal  Lifestyle   Physical activity    Days per week: 5 days    Minutes per session: 60 min   Stress: Not at all  Relationships   Social connections    Talks on phone: More than three times a week    Gets together: More than three times a week    Attends religious service: Never    Active member of club or organization: Yes    Attends meetings of clubs or organizations: More than 4 times per year    Relationship status: Married   Intimate partner violence    Fear of current or ex partner: No    Emotionally abused: No    Physically abused: No    Forced sexual activity: No  Other Topics Concern   Not on file  Social History Narrative   Who lives with you: spouse, 2 story home, does not have problems with the stairs, has walk in shower, handrails.   Any pets: Dog, Brayden, black lab/boxer mix   Diet: Patient has a varied diet of protein,  vegetables, starches.  Limits red meats.   Exercise: Patient exercises at least 3 times a week for over an hour.   Seatbelts: Patient reports wearing her seatbelt when in vehicle.     Nancy Fetter Exposure/Protection: Patient reports wearing sunscreen daily.   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 Biked 5 miles this morning to this appointment.  :  REVIEW OF SYSTEMS:  A 15 point review of systems is documented in the electronic medical record. This was obtained by the nursing staff. However, I reviewed this with the patient to discuss relevant findings and make appropriate changes.  Pertinent items are noted in HPI.   PHYSICAL EXAM:  Height 5' 6.5" (1.689 m), weight 121 lb (54.9 kg). General appearance: alert, cooperative and appears stated age  KPS = 74  100 - Normal; no complaints; no evidence of disease. 90   - Able to carry on normal activity; minor signs or symptoms of disease. 80   - Normal activity with effort; some signs or symptoms of disease. 20   - Cares for self; unable to carry on normal activity or to do active work. 60   - Requires occasional assistance, but is able to care for most of his personal needs. 50   - Requires considerable assistance and frequent medical care. 13   - Disabled; requires special care and assistance. 63   - Severely disabled; hospital admission is indicated although death not imminent. 51   - Very sick; hospital admission necessary; active supportive treatment necessary. 10   - Moribund; fatal processes progressing rapidly. 0     - Dead  Karnofsky DA, Abelmann Hopewell, Craver  LS and Burchenal [facility] [phone]) The use of the nitrogen mustards in the palliative treatment of carcinoma: with particular reference to bronchogenic carcinoma Cancer 1 634-56  LABORATORY DATA:  Lab Results  Component Value Date   WBC 3.4 (L) 01/07/2019   HGB 11.8 (L) 01/07/2019   HCT 35.4 (L) 01/07/2019   MCV 105.4 (H) 01/07/2019   PLT 144 (L) 01/07/2019   Lab Results  Component Value Date   NA 140 01/07/2019   K 4.1 01/07/2019   CL 107 01/07/2019   CO2 24 01/07/2019   Lab Results  Component Value Date   ALT 12 01/07/2019   AST 20 01/07/2019   ALKPHOS 49 01/07/2019   BILITOT 0.6 01/07/2019     RADIOGRAPHY: Mr Angio Head Wo Contrast  Result Date: 01/07/2019 CLINICAL DATA:  Initial evaluation for TIA, neck stiffness with balance difficulty, left-sided ear pain and fullness. Symptoms for 3 weeks. History of multiple myeloma. EXAM: MRI HEAD WITHOUT CONTRAST MRA HEAD WITHOUT CONTRAST MRA NECK WITHOUT AND WITH CONTRAST TECHNIQUE: Multiplanar, multiecho pulse sequences of the brain and surrounding structures were obtained without intravenous contrast. Angiographic images of the Circle of Willis were obtained using MRA technique without intravenous contrast. Angiographic images of the neck were obtained using MRA technique without and with intravenous contrast. Carotid stenosis measurements (when applicable) are obtained utilizing NASCET criteria, using the distal internal carotid diameter as the denominator. CONTRAST:  6 cc of Gadavist. COMPARISON:  Prior CT from 06/01/2008. FINDINGS: MRI HEAD FINDINGS Brain: Generalized age-related cerebral volume loss. Patchy T2/FLAIR hyperintensity within the periventricular and deep white matter both cerebral hemispheres most consistent with chronic small vessel ischemic disease, mild for age. No abnormal foci of restricted diffusion to suggest acute or subacute ischemia. Gray-white matter differentiation well maintained. No encephalomalacia to suggest chronic  infarction. No foci of susceptibility artifact to suggest acute or chronic intracranial hemorrhage. No parenchymal mass lesion. No midline shift or mass effect. No hydrocephalus. No extra-axial fluid collection. Pituitary gland and suprasellar region are normal. Midline structures intact and normal. Vascular: Major intracranial vascular flow voids are maintained. Skull and upper cervical spine: Abnormal mass lesion measuring approximately 5.9 x 1.6 x 2.4 cm seen centered at the left petrous apex (series 5, image 7). Lesion involves the left carotid canal, surrounding the petrous and cavernous left ICA. Left carotid flow void maintained without significant narrowing. Involvement of left Meckel's cave with probable involvement of the left trigeminal ganglion and fifth cranial nerve (series 8, image 48). Extension into the left cavernous sinus. Laterally, lesion closely approximates the left IAC and inner ear structures without frank invasion. Additional focal osseous lesion at the right occipital calvarium measures 2.7 x 1.1 cm (series 5, image 6). Mild mass effect on the adjacent right cerebellum without edema. Heterogeneous marrow signal intensity seen within the adjacent right occipital calvarium. Changes most likely related to history of multiple myeloma. Craniocervical junction within normal limits. Upper cervical spine within normal limits. No acute scalp soft tissue abnormality. Sinuses/Orbits: Globes and orbital soft tissues demonstrate no acute finding. Sequelae of prior ocular lens replacement with axial myopia noted. Mild scattered mucosal thickening throughout the ethmoidal air cells and maxillary sinuses. Paranasal sinuses are otherwise clear. Trace bilateral mastoid effusions noted. Other: None. MRA HEAD FINDINGS ANTERIOR CIRCULATION: Distal cervical segments of the internal carotid arteries patent with antegrade flow. Petrous, cavernous, and supraclinoid right ICA widely patent to the terminus without  stenosis. 2 mm focal outpouching arising from the cavernous right ICA suspicious for a small paraophthalmic aneurysm (series 11, image 79). This is directed inferiorly and slightly posteriorly. On the left, the petrous and cavernous segments of the left ICA are partially encased and surrounded by the osseous lesion at the left petrous apex. Normal vascular flow void is maintained without significant luminal narrowing or irregularity. Left ICA widely patent to the terminus. A1 segments widely patent. Normal anterior communicating artery complex. Anterior cerebral arteries widely patent to their distal aspects without stenosis. M1 segments widely patent. Normal MCA bifurcations. Distal MCA branches well perfused and symmetric. POSTERIOR CIRCULATION: Vertebral arteries patent to the vertebrobasilar junction without stenosis. Left vertebral artery dominant. Posterior inferior cerebral arteries patent proximally. Basilar widely patent to its distal aspect. Superior cerebral arteries patent bilaterally. Both of the posterior cerebral artery supplied via the basilar and are  well perfused to their distal aspects. MRA NECK FINDINGS Source images reviewed. Visualized aortic arch of normal caliber with normal branch pattern. Bovine arch with common origin of the right brachiocephalic and left common carotid artery noted. Visualized subclavian arteries are widely patent. Right common carotid artery tortuous proximally but widely patent to the bifurcation without stenosis. No significant atheromatous narrowing about the right bifurcation. Right ICA widely patent distally to the skull base without stenosis, dissection, or occlusion. Left common carotid artery mildly tortuous proximally but is widely patent to the bifurcation. No significant atheromatous narrowing about the left bifurcation. Left ICA widely patent distally to the skull base without stenosis, dissection, or occlusion. Both of the vertebral arteries arise from the  subclavian arteries. Left vertebral artery dominant. Vertebral arteries patent within the neck without stenosis, dissection, or occlusion. IMPRESSION: MRI HEAD IMPRESSION: 1. 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 Lesion closely approximates the left inner ear structures and involves the left Meckel's cave and left fifth cranial nerve, and most likely is the causative etiology for patient's underlying left ear and facial symptoms. Follow-up examination with postcontrast imaging suggested for complete evaluation. Inclusion of IAC sequences would likely be helpful for visualization. 2. Additional 2.7 cm lesion involving the right occipital calvarium with adjacent marrow signal abnormality, also likely related to multiple myeloma. Metastatic disease would be the primary differential consideration. 3. Underlying age-appropriate cerebral atrophy with mild chronic microvascular ischemic disease. No other acute intracranial abnormality. MRA HEAD IMPRESSION: 1. Negative intracranial MRA with no large vessel occlusion, hemodynamically significant stenosis, or other acute vascular abnormality. 2. Petrous and cavernous left ICA is partially encased and surrounded by the left petrous apex lesion without significant irregularity or stenosis. 3. 2 mm right paraophthalmic aneurysm. MRA NECK IMPRESSION: Normal MRA of the neck with wide patency of the carotid and vertebral arteries bilaterally. No hemodynamically significant stenosis or other acute vascular abnormality identified. Electronically Signed   By: Jeannine Boga M.D.   On: 01/07/2019 22:52   Mr Angio Neck W Wo Contrast  Result Date: 01/07/2019 CLINICAL DATA:  Initial evaluation for TIA, neck stiffness with balance difficulty, left-sided ear pain and fullness. Symptoms for 3 weeks. History of multiple myeloma. EXAM: MRI HEAD  WITHOUT CONTRAST MRA HEAD WITHOUT CONTRAST MRA NECK WITHOUT AND WITH CONTRAST TECHNIQUE: Multiplanar, multiecho pulse sequences of the brain and surrounding structures were obtained without intravenous contrast. Angiographic images of the Circle of Willis were obtained using MRA technique without intravenous contrast. Angiographic images of the neck were obtained using MRA technique without and with intravenous contrast. Carotid stenosis measurements (when applicable) are obtained utilizing NASCET criteria, using the distal internal carotid diameter as the denominator. CONTRAST:  6 cc of Gadavist. COMPARISON:  Prior CT from 06/01/2008. FINDINGS: MRI HEAD FINDINGS Brain: Generalized age-related cerebral volume loss. Patchy T2/FLAIR hyperintensity within the periventricular and deep white matter both cerebral hemispheres most consistent with chronic small vessel ischemic disease, mild for age. No abnormal foci of restricted diffusion to suggest acute or subacute ischemia. Gray-white matter differentiation well maintained. No encephalomalacia to suggest chronic infarction. No foci of susceptibility artifact to suggest acute or chronic intracranial hemorrhage. No parenchymal mass lesion. No midline shift or mass effect. No hydrocephalus. No extra-axial fluid collection. Pituitary gland and suprasellar region are normal. Midline structures intact and normal. Vascular: Major intracranial vascular flow voids are maintained. Skull and upper cervical spine:  Abnormal mass lesion measuring approximately 5.9 x 1.6 x 2.4 cm seen centered at the left petrous apex (series 5, image 7). Lesion involves the left carotid canal, surrounding the petrous and cavernous left ICA. Left carotid flow void maintained without significant narrowing. Involvement of left Meckel's cave with probable involvement of the left trigeminal ganglion and fifth cranial nerve (series 8, image 48). Extension into the left cavernous sinus. Laterally, lesion  closely approximates the left IAC and inner ear structures without frank invasion. Additional focal osseous lesion at the right occipital calvarium measures 2.7 x 1.1 cm (series 5, image 6). Mild mass effect on the adjacent right cerebellum without edema. Heterogeneous marrow signal intensity seen within the adjacent right occipital calvarium. Changes most likely related to history of multiple myeloma. Craniocervical junction within normal limits. Upper cervical spine within normal limits. No acute scalp soft tissue abnormality. Sinuses/Orbits: Globes and orbital soft tissues demonstrate no acute finding. Sequelae of prior ocular lens replacement with axial myopia noted. Mild scattered mucosal thickening throughout the ethmoidal air cells and maxillary sinuses. Paranasal sinuses are otherwise clear. Trace bilateral mastoid effusions noted. Other: None. MRA HEAD FINDINGS ANTERIOR CIRCULATION: Distal cervical segments of the internal carotid arteries patent with antegrade flow. Petrous, cavernous, and supraclinoid right ICA widely patent to the terminus without stenosis. 2 mm focal outpouching arising from the cavernous right ICA suspicious for a small paraophthalmic aneurysm (series 11, image 79). This is directed inferiorly and slightly posteriorly. On the left, the petrous and cavernous segments of the left ICA are partially encased and surrounded by the osseous lesion at the left petrous apex. Normal vascular flow void is maintained without significant luminal narrowing or irregularity. Left ICA widely patent to the terminus. A1 segments widely patent. Normal anterior communicating artery complex. Anterior cerebral arteries widely patent to their distal aspects without stenosis. M1 segments widely patent. Normal MCA bifurcations. Distal MCA branches well perfused and symmetric. POSTERIOR CIRCULATION: Vertebral arteries patent to the vertebrobasilar junction without stenosis. Left vertebral artery dominant.  Posterior inferior cerebral arteries patent proximally. Basilar widely patent to its distal aspect. Superior cerebral arteries patent bilaterally. Both of the posterior cerebral artery supplied via the basilar and are well perfused to their distal aspects. MRA NECK FINDINGS Source images reviewed. Visualized aortic arch of normal caliber with normal branch pattern. Bovine arch with common origin of the right brachiocephalic and left common carotid artery noted. Visualized subclavian arteries are widely patent. Right common carotid artery tortuous proximally but widely patent to the bifurcation without stenosis. No significant atheromatous narrowing about the right bifurcation. Right ICA widely patent distally to the skull base without stenosis, dissection, or occlusion. Left common carotid artery mildly tortuous proximally but is widely patent to the bifurcation. No significant atheromatous narrowing about the left bifurcation. Left ICA widely patent distally to the skull base without stenosis, dissection, or occlusion. Both of the vertebral arteries arise from the subclavian arteries. Left vertebral artery dominant. Vertebral arteries patent within the neck without stenosis, dissection, or occlusion. IMPRESSION: MRI HEAD IMPRESSION: 1. 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 Lesion closely approximates the left inner ear structures and involves the left Meckel's cave and left fifth cranial nerve, and most likely is the causative etiology for patient's underlying left ear and facial symptoms. Follow-up examination with postcontrast imaging suggested for complete evaluation. Inclusion of IAC sequences would likely be helpful for visualization.  2. Additional 2.7 cm lesion involving the right occipital calvarium with adjacent marrow signal abnormality, also likely related to multiple  myeloma. Metastatic disease would be the primary differential consideration. 3. Underlying age-appropriate cerebral atrophy with mild chronic microvascular ischemic disease. No other acute intracranial abnormality. MRA HEAD IMPRESSION: 1. Negative intracranial MRA with no large vessel occlusion, hemodynamically significant stenosis, or other acute vascular abnormality. 2. Petrous and cavernous left ICA is partially encased and surrounded by the left petrous apex lesion without significant irregularity or stenosis. 3. 2 mm right paraophthalmic aneurysm. MRA NECK IMPRESSION: Normal MRA of the neck with wide patency of the carotid and vertebral arteries bilaterally. No hemodynamically significant stenosis or other acute vascular abnormality identified. Electronically Signed   By: Jeannine Boga M.D.   On: 01/07/2019 22:52   Mr Brain Wo Contrast  Result Date: 01/07/2019 CLINICAL DATA:  Initial evaluation for TIA, neck stiffness with balance difficulty, left-sided ear pain and fullness. Symptoms for 3 weeks. History of multiple myeloma. EXAM: MRI HEAD WITHOUT CONTRAST MRA HEAD WITHOUT CONTRAST MRA NECK WITHOUT AND WITH CONTRAST TECHNIQUE: Multiplanar, multiecho pulse sequences of the brain and surrounding structures were obtained without intravenous contrast. Angiographic images of the Circle of Willis were obtained using MRA technique without intravenous contrast. Angiographic images of the neck were obtained using MRA technique without and with intravenous contrast. Carotid stenosis measurements (when applicable) are obtained utilizing NASCET criteria, using the distal internal carotid diameter as the denominator. CONTRAST:  6 cc of Gadavist. COMPARISON:  Prior CT from 06/01/2008. FINDINGS: MRI HEAD FINDINGS Brain: Generalized age-related cerebral volume loss. Patchy T2/FLAIR hyperintensity within the periventricular and deep white matter both cerebral hemispheres most consistent with chronic small vessel  ischemic disease, mild for age. No abnormal foci of restricted diffusion to suggest acute or subacute ischemia. Gray-white matter differentiation well maintained. No encephalomalacia to suggest chronic infarction. No foci of susceptibility artifact to suggest acute or chronic intracranial hemorrhage. No parenchymal mass lesion. No midline shift or mass effect. No hydrocephalus. No extra-axial fluid collection. Pituitary gland and suprasellar region are normal. Midline structures intact and normal. Vascular: Major intracranial vascular flow voids are maintained. Skull and upper cervical spine: Abnormal mass lesion measuring approximately 5.9 x 1.6 x 2.4 cm seen centered at the left petrous apex (series 5, image 7). Lesion involves the left carotid canal, surrounding the petrous and cavernous left ICA. Left carotid flow void maintained without significant narrowing. Involvement of left Meckel's cave with probable involvement of the left trigeminal ganglion and fifth cranial nerve (series 8, image 48). Extension into the left cavernous sinus. Laterally, lesion closely approximates the left IAC and inner ear structures without frank invasion. Additional focal osseous lesion at the right occipital calvarium measures 2.7 x 1.1 cm (series 5, image 6). Mild mass effect on the adjacent right cerebellum without edema. Heterogeneous marrow signal intensity seen within the adjacent right occipital calvarium. Changes most likely related to history of multiple myeloma. Craniocervical junction within normal limits. Upper cervical spine within normal limits. No acute scalp soft tissue abnormality. Sinuses/Orbits: Globes and orbital soft tissues demonstrate no acute finding. Sequelae of prior ocular lens replacement with axial myopia noted. Mild scattered mucosal thickening throughout the ethmoidal air cells and maxillary sinuses. Paranasal sinuses are otherwise clear. Trace bilateral mastoid effusions noted. Other: None. MRA HEAD  FINDINGS ANTERIOR CIRCULATION: Distal cervical segments of the internal carotid arteries patent with antegrade flow. Petrous, cavernous, and supraclinoid right ICA widely patent to the terminus without stenosis. 2 mm  focal outpouching arising from the cavernous right ICA suspicious for a small paraophthalmic aneurysm (series 11, image 79). This is directed inferiorly and slightly posteriorly. On the left, the petrous and cavernous segments of the left ICA are partially encased and surrounded by the osseous lesion at the left petrous apex. Normal vascular flow void is maintained without significant luminal narrowing or irregularity. Left ICA widely patent to the terminus. A1 segments widely patent. Normal anterior communicating artery complex. Anterior cerebral arteries widely patent to their distal aspects without stenosis. M1 segments widely patent. Normal MCA bifurcations. Distal MCA branches well perfused and symmetric. POSTERIOR CIRCULATION: Vertebral arteries patent to the vertebrobasilar junction without stenosis. Left vertebral artery dominant. Posterior inferior cerebral arteries patent proximally. Basilar widely patent to its distal aspect. Superior cerebral arteries patent bilaterally. Both of the posterior cerebral artery supplied via the basilar and are well perfused to their distal aspects. MRA NECK FINDINGS Source images reviewed. Visualized aortic arch of normal caliber with normal branch pattern. Bovine arch with common origin of the right brachiocephalic and left common carotid artery noted. Visualized subclavian arteries are widely patent. Right common carotid artery tortuous proximally but widely patent to the bifurcation without stenosis. No significant atheromatous narrowing about the right bifurcation. Right ICA widely patent distally to the skull base without stenosis, dissection, or occlusion. Left common carotid artery mildly tortuous proximally but is widely patent to the bifurcation. No  significant atheromatous narrowing about the left bifurcation. Left ICA widely patent distally to the skull base without stenosis, dissection, or occlusion. Both of the vertebral arteries arise from the subclavian arteries. Left vertebral artery dominant. Vertebral arteries patent within the neck without stenosis, dissection, or occlusion. IMPRESSION: MRI HEAD IMPRESSION: 1. 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 Lesion closely approximates the left inner ear structures and involves the left Meckel's cave and left fifth cranial nerve, and most likely is the causative etiology for patient's underlying left ear and facial symptoms. Follow-up examination with postcontrast imaging suggested for complete evaluation. Inclusion of IAC sequences would likely be helpful for visualization. 2. Additional 2.7 cm lesion involving the right occipital calvarium with adjacent marrow signal abnormality, also likely related to multiple myeloma. Metastatic disease would be the primary differential consideration. 3. Underlying age-appropriate cerebral atrophy with mild chronic microvascular ischemic disease. No other acute intracranial abnormality. MRA HEAD IMPRESSION: 1. Negative intracranial MRA with no large vessel occlusion, hemodynamically significant stenosis, or other acute vascular abnormality. 2. Petrous and cavernous left ICA is partially encased and surrounded by the left petrous apex lesion without significant irregularity or stenosis. 3. 2 mm right paraophthalmic aneurysm. MRA NECK IMPRESSION: Normal MRA of the neck with wide patency of the carotid and vertebral arteries bilaterally. No hemodynamically significant stenosis or other acute vascular abnormality identified. Electronically Signed   By: Jeannine Boga M.D.   On: 01/07/2019 22:52      IMPRESSION: 78 yo woman with a  history of multiple myeloma and a symptomatic 5.9 cm skull base lesion centered in the left petrous apex   Plan:  Today, I talked to the patient and family about the findings and work-up thus far.  We discussed the natural history of symptomatic sites of myeloma and more specifically skull base tumors and general treatment, highlighting the role of radiotherapy in the management.  We discussed the available radiation techniques, and focused on the details of logistics  and delivery.  We reviewed the anticipated acute and late sequelae associated with radiation in this setting.  The patient was encouraged to ask questions that I answered to the best of my ability.  I filled out a patient counseling form during our discussion including treatment diagrams.  We retained a copy for our records.  The patient would like to proceed with radiation and will be scheduled for CT simulation tomorrow at 9 am, then start 10 fractions to 20 Gy finishing 01/27/19.  I spent time video face to face with the patient and more than 50% of that time was spent in counseling and/or coordination of care.  This visit was conducted via Zoom video visit to spare the patient unnecessary potential exposure in the healthcare setting during the current COVID-19 pandemic.  Using the Zoom platform, I was able to 'share my screen' and show her the recent MRI and previous radiologic studies.  Given current concerns for patient exposure during the COVID-19 pandemic, this encounter was conducted via [place] meeting to allow for face to face video communicationThe patient has given verbal consent for this type of encounter. The time spent during this encounter was 45 minutes. The attendants for this meeting include Tyler Pita MD and patient During the encounter, Tyler Pita MD was located at [facility] Radiation Oncology Department.  Patient, was located at home. I spent 20 minutes minutes face to face with the patient  and more than 50% of that time was spent in counseling and/or coordination of care.   ------------------------------------------------   Tyler Pita, MD [place] Director and Director of Stereotactic Radiosurgery Direct Dial: [phone]   Fax: [phone] [email or web address]   Skype   LinkedIn

## 2019-01-12 ENCOUNTER — Other Ambulatory Visit: Payer: Self-pay

## 2019-01-12 ENCOUNTER — Inpatient Hospital Stay (HOSPITAL_BASED_OUTPATIENT_CLINIC_OR_DEPARTMENT_OTHER): Payer: Medicare Other | Admitting: Hematology

## 2019-01-12 ENCOUNTER — Ambulatory Visit
Admission: RE | Admit: 2019-01-12 | Discharge: 2019-01-12 | Disposition: A | Discharge status: Home / Self Care | Payer: Medicare Other | Source: Ambulatory Visit | Attending: Radiation Oncology | Admitting: Radiation Oncology

## 2019-01-12 ENCOUNTER — Encounter: Payer: Self-pay | Admitting: Hematology

## 2019-01-12 VITALS — BP 142/76 | HR 66 | Temp 98.9°F | Resp 18 | Ht 66.5 in | Wt 120.7 lb

## 2019-01-12 DIAGNOSIS — Z9221 Personal history of antineoplastic chemotherapy: Secondary | ICD-10-CM | POA: Diagnosis not present

## 2019-01-12 DIAGNOSIS — Z79899 Other long term (current) drug therapy: Secondary | ICD-10-CM

## 2019-01-12 DIAGNOSIS — C9000 Multiple myeloma not having achieved remission: Secondary | ICD-10-CM

## 2019-01-12 DIAGNOSIS — D61818 Other pancytopenia: Secondary | ICD-10-CM | POA: Diagnosis not present

## 2019-01-12 DIAGNOSIS — I252 Old myocardial infarction: Secondary | ICD-10-CM | POA: Diagnosis not present

## 2019-01-12 DIAGNOSIS — Z5112 Encounter for antineoplastic immunotherapy: Secondary | ICD-10-CM | POA: Diagnosis not present

## 2019-01-12 DIAGNOSIS — Z7982 Long term (current) use of aspirin: Secondary | ICD-10-CM | POA: Diagnosis not present

## 2019-01-12 DIAGNOSIS — E785 Hyperlipidemia, unspecified: Secondary | ICD-10-CM | POA: Diagnosis not present

## 2019-01-12 DIAGNOSIS — M810 Age-related osteoporosis without current pathological fracture: Secondary | ICD-10-CM

## 2019-01-12 DIAGNOSIS — G629 Polyneuropathy, unspecified: Secondary | ICD-10-CM

## 2019-01-12 DIAGNOSIS — C9002 Multiple myeloma in relapse: Secondary | ICD-10-CM

## 2019-01-12 DIAGNOSIS — Z510 Encounter for antineoplastic radiation therapy: Secondary | ICD-10-CM | POA: Insufficient documentation

## 2019-01-12 DIAGNOSIS — Z923 Personal history of irradiation: Secondary | ICD-10-CM | POA: Diagnosis not present

## 2019-01-12 DIAGNOSIS — C7951 Secondary malignant neoplasm of bone: Secondary | ICD-10-CM | POA: Diagnosis not present

## 2019-01-12 DIAGNOSIS — C9001 Multiple myeloma in remission: Secondary | ICD-10-CM

## 2019-01-12 NOTE — Progress Notes (Signed)
  Radiation Oncology         [phone] ________________________________  Name: Shannon Rush MRN: [id number]  Date: 01/12/2019  DOB: 1941-06-21  SIMULATION AND TREATMENT PLANNING NOTE    ICD-10-CM   1. Multiple myeloma in remission ([place])  C90.01     DIAGNOSIS:  78 yo woman with skull base myeloma lesion causing left ear pain  NARRATIVE:  The patient was brought to the [facility].  Identity was confirmed.  All relevant records and images related to the planned course of therapy were reviewed.  The patient freely provided informed written consent to proceed with treatment after reviewing the details related to the planned course of therapy. The consent form was witnessed and verified by the simulation staff.  Then, the patient was set-up in a stable reproducible  supine position for radiation therapy.  A thermoplastic mask was custom made.  CT images were obtained.  Surface markings were placed.  The CT images were loaded into the planning software.  Then the target and avoidance structures were contoured.  Treatment planning then occurred.  The radiation prescription was entered and confirmed.  Then, I designed and supervised the construction of a total of medically necessary complex treatment devices, including the custom made thermoplastic mask used for immobilization and complex multileaf collimators to shield the eyes, face and uninvolved brain, see final treatment plan for details.  Each [facility] is independently created to account for beam divergence.    I have requested : 3D plan with [facility] for intracranial structures and target.  PLAN:  The whole brain will be treated to 20 Gy in 10 fractions.  ________________________________  Sheral Apley Tammi Klippel, M.D.

## 2019-01-13 ENCOUNTER — Telehealth: Payer: Self-pay | Admitting: Hematology

## 2019-01-13 ENCOUNTER — Other Ambulatory Visit: Payer: Self-pay

## 2019-01-13 ENCOUNTER — Ambulatory Visit: Payer: Medicare Other

## 2019-01-13 DIAGNOSIS — C9000 Multiple myeloma not having achieved remission: Secondary | ICD-10-CM | POA: Insufficient documentation

## 2019-01-13 DIAGNOSIS — C7951 Secondary malignant neoplasm of bone: Secondary | ICD-10-CM | POA: Diagnosis not present

## 2019-01-13 DIAGNOSIS — Z510 Encounter for antineoplastic radiation therapy: Secondary | ICD-10-CM | POA: Diagnosis not present

## 2019-01-13 NOTE — Telephone Encounter (Signed)
No los per 6/30. °

## 2019-01-14 ENCOUNTER — Telehealth: Payer: Self-pay

## 2019-01-14 ENCOUNTER — Ambulatory Visit
Admission: RE | Admit: 2019-01-14 | Discharge: 2019-01-14 | Disposition: A | Discharge status: Home / Self Care | Payer: Medicare Other | Source: Ambulatory Visit | Attending: Radiation Oncology | Admitting: Radiation Oncology

## 2019-01-14 ENCOUNTER — Other Ambulatory Visit: Payer: Self-pay

## 2019-01-14 DIAGNOSIS — C9000 Multiple myeloma not having achieved remission: Secondary | ICD-10-CM | POA: Diagnosis not present

## 2019-01-14 DIAGNOSIS — C7951 Secondary malignant neoplasm of bone: Secondary | ICD-10-CM | POA: Diagnosis not present

## 2019-01-14 DIAGNOSIS — Z510 Encounter for antineoplastic radiation therapy: Secondary | ICD-10-CM | POA: Diagnosis not present

## 2019-01-14 NOTE — Telephone Encounter (Signed)
Patient called with schedule conflict for 8/18, she needs her time changed to after 11:30 that day or any time on 7/17, I sent a scheduling message with her time/day request to change this appointment and let the patient know this has been done, she was very appreciative of the call.

## 2019-01-15 ENCOUNTER — Other Ambulatory Visit: Payer: Medicare Other

## 2019-01-17 ENCOUNTER — Other Ambulatory Visit: Payer: Self-pay | Admitting: Cardiology

## 2019-01-18 ENCOUNTER — Other Ambulatory Visit: Payer: Self-pay

## 2019-01-18 ENCOUNTER — Ambulatory Visit
Admission: RE | Admit: 2019-01-18 | Discharge: 2019-01-18 | Disposition: A | Discharge status: Home / Self Care | Payer: Medicare Other | Source: Ambulatory Visit | Attending: Radiation Oncology | Admitting: Radiation Oncology

## 2019-01-18 DIAGNOSIS — C9000 Multiple myeloma not having achieved remission: Secondary | ICD-10-CM | POA: Diagnosis not present

## 2019-01-18 DIAGNOSIS — Z510 Encounter for antineoplastic radiation therapy: Secondary | ICD-10-CM | POA: Diagnosis not present

## 2019-01-18 DIAGNOSIS — C7951 Secondary malignant neoplasm of bone: Secondary | ICD-10-CM | POA: Diagnosis not present

## 2019-01-19 ENCOUNTER — Encounter: Payer: Self-pay | Admitting: Hematology

## 2019-01-19 ENCOUNTER — Ambulatory Visit
Admission: RE | Admit: 2019-01-19 | Discharge: 2019-01-19 | Disposition: A | Discharge status: Home / Self Care | Payer: Medicare Other | Source: Ambulatory Visit | Attending: Radiation Oncology | Admitting: Radiation Oncology

## 2019-01-19 ENCOUNTER — Other Ambulatory Visit: Payer: Self-pay

## 2019-01-19 ENCOUNTER — Telehealth: Payer: Self-pay | Admitting: Hematology

## 2019-01-19 DIAGNOSIS — C9000 Multiple myeloma not having achieved remission: Secondary | ICD-10-CM | POA: Diagnosis not present

## 2019-01-19 DIAGNOSIS — C7951 Secondary malignant neoplasm of bone: Secondary | ICD-10-CM | POA: Diagnosis not present

## 2019-01-19 DIAGNOSIS — Z510 Encounter for antineoplastic radiation therapy: Secondary | ICD-10-CM | POA: Diagnosis not present

## 2019-01-19 NOTE — Telephone Encounter (Signed)
R/s appt per patient request from 7/16 to 7/17 - pt aware of time and date. R/s approved by [facility] and Dr. Burr Medico

## 2019-01-20 ENCOUNTER — Other Ambulatory Visit: Payer: Self-pay

## 2019-01-20 ENCOUNTER — Ambulatory Visit
Admission: RE | Admit: 2019-01-20 | Discharge: 2019-01-20 | Disposition: A | Discharge status: Home / Self Care | Payer: Medicare Other | Source: Ambulatory Visit | Attending: Radiation Oncology | Admitting: Radiation Oncology

## 2019-01-20 DIAGNOSIS — C9000 Multiple myeloma not having achieved remission: Secondary | ICD-10-CM | POA: Diagnosis not present

## 2019-01-20 DIAGNOSIS — C7951 Secondary malignant neoplasm of bone: Secondary | ICD-10-CM | POA: Diagnosis not present

## 2019-01-20 DIAGNOSIS — Z510 Encounter for antineoplastic radiation therapy: Secondary | ICD-10-CM | POA: Diagnosis not present

## 2019-01-21 ENCOUNTER — Other Ambulatory Visit: Payer: Self-pay

## 2019-01-21 ENCOUNTER — Ambulatory Visit
Admission: RE | Admit: 2019-01-21 | Discharge: 2019-01-21 | Disposition: A | Discharge status: Home / Self Care | Payer: Medicare Other | Source: Ambulatory Visit | Attending: Radiation Oncology | Admitting: Radiation Oncology

## 2019-01-21 DIAGNOSIS — Z510 Encounter for antineoplastic radiation therapy: Secondary | ICD-10-CM | POA: Diagnosis not present

## 2019-01-21 DIAGNOSIS — C9000 Multiple myeloma not having achieved remission: Secondary | ICD-10-CM | POA: Diagnosis not present

## 2019-01-21 DIAGNOSIS — C7951 Secondary malignant neoplasm of bone: Secondary | ICD-10-CM | POA: Diagnosis not present

## 2019-01-22 ENCOUNTER — Other Ambulatory Visit: Payer: Self-pay

## 2019-01-22 ENCOUNTER — Ambulatory Visit
Admission: RE | Admit: 2019-01-22 | Discharge: 2019-01-22 | Disposition: A | Discharge status: Home / Self Care | Payer: Medicare Other | Source: Ambulatory Visit | Attending: Radiation Oncology | Admitting: Radiation Oncology

## 2019-01-22 DIAGNOSIS — Z510 Encounter for antineoplastic radiation therapy: Secondary | ICD-10-CM | POA: Diagnosis not present

## 2019-01-22 DIAGNOSIS — C7951 Secondary malignant neoplasm of bone: Secondary | ICD-10-CM | POA: Diagnosis not present

## 2019-01-22 DIAGNOSIS — C9000 Multiple myeloma not having achieved remission: Secondary | ICD-10-CM | POA: Diagnosis not present

## 2019-01-25 ENCOUNTER — Other Ambulatory Visit: Payer: Self-pay

## 2019-01-25 ENCOUNTER — Ambulatory Visit
Admission: RE | Admit: 2019-01-25 | Discharge: 2019-01-25 | Disposition: A | Discharge status: Home / Self Care | Payer: Medicare Other | Source: Ambulatory Visit | Attending: Radiation Oncology | Admitting: Radiation Oncology

## 2019-01-25 DIAGNOSIS — Z510 Encounter for antineoplastic radiation therapy: Secondary | ICD-10-CM | POA: Diagnosis not present

## 2019-01-25 DIAGNOSIS — C7951 Secondary malignant neoplasm of bone: Secondary | ICD-10-CM | POA: Diagnosis not present

## 2019-01-25 DIAGNOSIS — C9000 Multiple myeloma not having achieved remission: Secondary | ICD-10-CM | POA: Diagnosis not present

## 2019-01-26 ENCOUNTER — Other Ambulatory Visit: Payer: Self-pay

## 2019-01-26 ENCOUNTER — Ambulatory Visit
Admission: RE | Admit: 2019-01-26 | Discharge: 2019-01-26 | Disposition: A | Discharge status: Home / Self Care | Payer: Medicare Other | Source: Ambulatory Visit | Attending: Radiation Oncology | Admitting: Radiation Oncology

## 2019-01-26 DIAGNOSIS — Z510 Encounter for antineoplastic radiation therapy: Secondary | ICD-10-CM | POA: Diagnosis not present

## 2019-01-26 DIAGNOSIS — C7951 Secondary malignant neoplasm of bone: Secondary | ICD-10-CM | POA: Diagnosis not present

## 2019-01-26 DIAGNOSIS — C9000 Multiple myeloma not having achieved remission: Secondary | ICD-10-CM | POA: Diagnosis not present

## 2019-01-27 ENCOUNTER — Other Ambulatory Visit: Payer: Self-pay

## 2019-01-27 ENCOUNTER — Encounter: Payer: Self-pay | Admitting: Radiation Oncology

## 2019-01-27 ENCOUNTER — Ambulatory Visit
Admission: RE | Admit: 2019-01-27 | Discharge: 2019-01-27 | Disposition: A | Discharge status: Home / Self Care | Payer: Medicare Other | Source: Ambulatory Visit | Attending: Radiation Oncology | Admitting: Radiation Oncology

## 2019-01-27 DIAGNOSIS — C7951 Secondary malignant neoplasm of bone: Secondary | ICD-10-CM | POA: Diagnosis not present

## 2019-01-27 DIAGNOSIS — Z510 Encounter for antineoplastic radiation therapy: Secondary | ICD-10-CM | POA: Diagnosis not present

## 2019-01-27 DIAGNOSIS — C9000 Multiple myeloma not having achieved remission: Secondary | ICD-10-CM | POA: Diagnosis not present

## 2019-01-27 NOTE — Progress Notes (Signed)
[facility]   Telephone:(336) [phone] Fax:([phone]   Clinic Follow up Note   Patient Care Team: [name], MD as PCP - General (Family Medicine) [name], MD as Attending Physician (Obstetrics and Gynecology) [name], MD as Consulting Physician (Internal Medicine) [name], MD as Consulting Physician (Hematology) [name], MD as Consulting Physician (Ophthalmology) [name], MD as Consulting Physician (Cardiology) [name], MD as Consulting Physician (Dermatology) [name] (Dentistry) [name], DPM as Consulting Physician (Podiatry) Pleasant, Eppie Gibson, RN as [place] Management Pleasant, Eppie Gibson, RN as [place] Management  Date of Service:  01/29/2019   CHIEF COMPLAINT: F/u on MM  SUMMARY OF ONCOLOGIC HISTORY: Oncology History Overview Note  Multiple myeloma ([place])   Staging form: Multiple Myeloma, AJCC 6th Edition   - Clinical: Stage IIIA - Signed by [name], MD on 09/04/2013    Multiple myeloma ([place])  05/26/2012 Initial Diagnosis   Presenting IgG was 4,040 mg/dL on 06/05/2012 (IgA 35; IgM 34); kappa free light chain 34.4 mg/dL, lambda 0.00, kappa:lambda ratio of 34.75; SPEP with M-spike of 2.60.   06/30/2012 Imaging   Numerous lytic lesions throughout the calvarium.  Lytic lesion within the left lateral scapula.  Findings most compatible with metastases or myeloma. Multiple mid and lower thoracic compression fractures.  Slight compression through the endplates at L3.   55/73/2202 Bone Marrow Biopsy   Bone marrow biopsy showed 49% plasma cell. Normal classical cytogenetics; however, myeloma FISH panel showed 13q- (intermediate risk)       07/14/2012 - 07/27/2012 Radiation Therapy   Palliative radiation 20 Gy over 10 fractions between to thoracic spine cord compression and symptomatic left scapula lytic lesion.   08/10/2012 - 11/2012  Chemotherapy   Started SQ Velcade once weekly, 3 weeks on, 1 weeks off; daily Revlimid d1-21, 7 days off; and Dexamethasone 73m PO weekly.    02/12/2013 Bone Marrow Transplant   [facility]    06/14/2013 Tumor Marker   IgG  1830,  Kappa:lamba ratio, 1.69 (baseline was 34.75 or   M-spike 0.28 (baseline of 2.6 or  89.3% of baseline).     06/25/2013 -  Chemotherapy   Started zometa 3.5 mg monthly    09/01/2013 - 01/2014 Chemotherapy   Maintenance therapy with revlimid 5106mdaily for 14 days and then off for 7 (decreased from 21 days on and 7 days off based on neutropenia). Stopped due to disease progression 01/2017   09/13/2013 - 09/17/2014 Hospital Admission   Hospital admission for pneumonia   12/20/2013 Treatment Plan Change   Maintenance therapy decreased to 2.5 mg daily for 21 days and then off for 7 days based on low counts.    01/25/2014 Tumor Marker   IgG 1360 M spike 0.5   03/01/2014 - 03/05/2014 Hospital Ad[facility], received thrombolytic therapy, cath showed 60% prox RCA, mid-distal RCA 50%, Echo normal, no stents.   04/11/2014 - 08/07/2014 Chemotherapy   Continue Revlimid at 2.5 mg 3 weeks on 1 week off   08/08/2014 - 08/13/2014 Hospital Admission   Hospital admission for pneumonia. Her Revlimid was held.   08/29/2014 - 09/14/2014 Chemotherapy   Maintenance Revlimid restarted   09/14/2014 - 09/17/2014 Hospital Admission   [place].   11/13/2014 - 12/25/2015 Chemotherapy   Maintenance Revlimid  restarted, changed to 2.'5mg'$  daily, 2 weks on, 1 week off from 12/15/2014, stopped due to disease progression    01/24/2016 Progression   Patient is M protein has gradually increased to 1.0g, repeated a bone marrow biopsy showed plasma cell 10-30%   02/27/2016 - 03/07/2016 Chemotherapy   Pomalidomide 4 mg daily on day 1-21, dexamethasone  40 mg on day 1, 8 and 15, every 28 days, started on 02/27/2016, daratumumab weekly started on 03/07/2016, held after first dose, due to hospitalization and a severe pancytopenia, pomalidomide was stopped afterwards    03/10/2016 - 03/29/2016 Hospital Admission   Pt was admitted for sepsis from pneumonia, and severe pancytopenia. She required ICU stay for a few days due to hypotension, developed b/l pleural effusion required diuretics and b/l thoracentesis. She also required blood and plt transfusion, and prolonged neupogen injection for severe neutropenia. She was discharged home after 3 weeks hospital stay    04/26/2016 -  Chemotherapy   Daratumumab restarted on 04/26/2016, Velcade 1.'3mg'$ /m2 and dexa '20mg'$  weekly start on 11/3, velcade held since 06/28/2016 due to CHF, dexa stopped on12/11/2017 due to high risk of fracture    08/26/2016 Echocardiogram   - Left ventricle: LVEF is approximately 35% with diffuse diffuse   hypokinesis with severe hypokinesis/ akinesis of the   inferior/inferoseptal walls. In comparison to echo images from   December 2017, there does not appear to be significant change The   cavity size was normal. Wall thickness was normal. Doppler   parameters are consistent with abnormal left ventricular   relaxation (grade 1 diastolic dysfunction). - Aortic valve: AV is thickened. 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 Present in echo from December 2017 There was   trivial regurgitation. - Right ventricle: Systolic function was mildly reduced. - Right atrium: The atrium was mildly dilated. - Pericardium, extracardiac: A trivial pericardial effusion was   identified.   12/03/2016 Echocardiogram   -Left Ventricle: Systolic function was   mildly to moderately reduced. The estimated ejection fraction was   in the range of 40% to 45%. Diffuse hypokinesis. Doppler   parameters are consistent with abnormal left  ventricular   relaxation (grade 1 diastolic dysfunction). Doppler parameters   are consistent with indeterminate ventricular filling pressure. - Left atrium: The atrium was severely dilated. - Tricuspid valve: There was trivial regurgitation. - Pulmonary arteries: Systolic pressure was within the normal   range. PA peak pressure: 33 mm Hg (S). - Global longitudinal strain -10.5% (abnormal)   03/12/2017 Echocardiogram   ECHO 03/12/17 Study Conclusions - Left ventricle: The cavity size was normal. Wall thickness was   increased in a pattern of mild LVH. Systolic function was normal.   The estimated ejection fraction was in the range of 50% to 55%.   There is hypokinesis of the basalinferior myocardium. Doppler   parameters are consistent with abnormal left ventricular   relaxation (grade 1 diastolic dysfunction). - Aortic valve: There was trivial regurgitation. - Mitral valve: Calcified annulus. Mildly thickened leaflets .   There was trivial regurgitation. - Tricuspid valve: There was mild regurgitation. Impressions: - There has been mild improvement in EF since prior study.    12/12/2017 - 12/15/2017 Hospital Admission   Admission diagnosis: Pnuemonia and acute hypoxic respiratory failure Additional comments: treated for pneumoania and acute hypoxic respiratory failure before she was discharged on 12/15/17 with oral anitbiotics. She did not require home oxygen.    12/30/2017 - 01/02/2018 Hospital  Admission   Admission diagnosis: Pnuemonia  Additional comments: She was again hospitalized on 12/30/17 for pneumonia, ID work up was otherwise negative, she was discharged home with oral antibiotics.    04/01/2018 Imaging   04/01/2018 CXR IMPRESSION: Persisting airspace disease superimposed on chronic changes, suggesting pneumonia given the history. Followup PA and lateral chest X-ray is recommended in 3-4 weeks following therapy to ensure resolution and exclude underlying  malignancy.  Redemonstration of mid and lower thoracic compression fractures with associated thoracic kyphosis.   04/23/2018 Echocardiogram   04/23/2018 ECHO LV EF: 45% -   50%   01/07/2019 Imaging   MRI Brain 01/07/19 IMPRESSION: MRI HEAD IMPRESSION:   1. 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 Lesion closely approximates the left inner ear structures and involves the left Meckel's cave and left fifth cranial nerve, and most likely is the causative etiology for patient's underlying left ear and facial symptoms. Follow-up examination with postcontrast imaging suggested for complete evaluation. Inclusion of IAC sequences would likely be helpful for visualization. 2. Additional 2.7 cm lesion involving the right occipital calvarium with adjacent marrow signal abnormality, also likely related to multiple myeloma. Metastatic disease would be the primary differential consideration. 3. Underlying age-appropriate cerebral atrophy with mild chronic microvascular ischemic disease. No other acute intracranial abnormality.   MRA HEAD IMPRESSION:   1. Negative intracranial MRA with no large vessel occlusion, hemodynamically significant stenosis, or other acute vascular abnormality. 2. Petrous and cavernous left ICA is partially encased and surrounded by the left petrous apex lesion without significant irregularity or stenosis. 3. 2 mm right paraophthalmic aneurysm.   MRA NECK IMPRESSION:   Normal MRA of the neck with wide patency of the carotid and vertebral arteries bilaterally. No hemodynamically significant stenosis or other acute vascular abnormality identified.     Radiation Therapy   Radaition to Left petrous apex bone lesion 01/13/19-01/27/19      CURRENT THERAPY:  Daratumumabevery 3 weeks. Dexamethasone a day before treatment.Dexa  stopped in 07/2018 due to well controlled MM and high risk of fracture. Dara reduced to every 4 weeks due to COVID-19 starting 10/23/18.Returned to every 3 weeks starting 12/17/18 -Target RT to skull base bone lesion with Dr Tammi Klippel 01/13/19-01/27/19  INTERVAL HISTORY:  Shannon Rush is here for a follow up and treatment. She presents to the clinic alone. She notes she is did well with radiation to her ear. She did not notice any change in symptoms. She has not followed up with Dr Tammi Klippel yet. She notes mid right back pain. This comes and goes but more frequently.  She still wants to go on her trip. She will be driving with her husband 7.5 hours to [place] for family trip.    REVIEW OF SYSTEMS:   Constitutional: Denies fevers, chills or abnormal weight loss Eyes: Denies blurriness of vision Ears, nose, mouth, throat, and face: Denies mucositis or sore throat Respiratory: Denies cough, dyspnea or wheezes Cardiovascular: Denies palpitation, chest discomfort or lower extremity swelling Gastrointestinal:  Denies nausea, heartburn or change in bowel habits Skin: Denies abnormal skin rashes MSK: (+) mid right back pain Lymphatics: Denies new lymphadenopathy or easy bruising Neurological:Denies numbness, tingling or new weaknesses Behavioral/Psych: Mood is stable, no new changes  All other systems were reviewed with the patient and are negative.  MEDICAL HISTORY:  Past Medical History:  Diagnosis Date   CAD (coronary artery disease)  a. inf STEMI (in [place] >>> [facility]) >>> LHC (8/15- [place]):  pRCA 60%, mid to dist RCA 50% >>> med rx   Cardiomyopathy [facility])    Cataract    Chronic combined systolic and diastolic CHF (congestive heart failure) (HCC)    Cord compression ([place]) 07/13/12   MRI- diffuse myeloma involvement of T-L spine   History of radiation therapy 07/13/12-07/27/12   spinal cord compression T3-T10,left scapula   Hx of echocardiogram    Echo  (03/02/14-done at [facility]):  Normal LVEF without significant regional wall motion abnormalities. Mild    Multiple myeloma ([place]) 07/01/2012   MVP (mitral valve prolapse)    a. trivial by echo 06/2017.   OSTEOPOROSIS 06/11/2010   Multiple compression fractures; and spontaneous fracture of sternum Qualifier: Diagnosis of  By: [name], Susan      Thoracic kyphosis 07/13/12   per MRI scan   Unspecified deficiency anemia     SURGICAL HISTORY: Past Surgical History:  Procedure Laterality Date   APPENDECTOMY     CATARACT EXTRACTION, BILATERAL     CESAREAN SECTION     x2    COLONOSCOPY  2007   neg with Dr. [name]   ELBOW SURGERY     HIP SURGERY  2009   left   TUBAL LIGATION      I have reviewed the social history and family history with the patient and they are unchanged from previous note.  ALLERGIES:  is allergic to zithromax [azithromycin]; zosyn [piperacillin sod-tazobactam so]; and quinolones.  MEDICATIONS:  Current Outpatient Medications  Medication Sig Dispense Refill   acetaminophen (TYLENOL) 500 MG tablet Take 500 mg by mouth every 6 (six) hours as needed for moderate pain or fever.     acyclovir (ZOVIRAX) 400 MG tablet Take 1 tablet (400 mg total) by mouth 2 (two) times daily. 60 tablet 11   aspirin EC 81 MG tablet Take 1 tablet (81 mg total) by mouth daily. 90 tablet 3   atorvastatin (LIPITOR) 20 MG tablet TAKE 1 TABLET BY MOUTH  DAILY 90 tablet 11   Calcium Carbonate-Vit D-Min (CALCIUM 600+D PLUS MINERALS) 600-400 MG-UNIT TABS Take by mouth.     metoprolol succinate (TOPROL-XL) 25 MG 24 hr tablet      No current facility-administered medications for this visit.    Facility-Administered Medications Ordered in Other Visits  Medication Dose Route Frequency Provider Last Rate Last Dose   daratumumab (DARZALEX) 900 mg in sodium chloride 0.9 % 455 mL chemo infusion  16 mg/kg (Treatment Plan Recorded) Intravenous Once [name],  MD        PHYSICAL EXAMINATION: ECOG PERFORMANCE STATUS: 1 - Symptomatic but completely ambulatory Weight 53 kg, temperature 98.5, pulse 89, respirate 18, pulse ox 100% on room air GENERAL:alert, no distress and comfortable SKIN: skin color, texture, turgor are normal, no rashes or significant lesions EYES: normal, Conjunctiva are pink and non-injected, sclera clear  NECK: supple, thyroid normal size, non-tender, without nodularity LYMPH:  no palpable lymphadenopathy in the cervical, axillary  LUNGS: clear to auscultation and percussion with normal breathing effort HEART: regular rate & rhythm and no murmurs and no lower extremity edema ABDOMEN:abdomen soft, non-tender and normal bowel sounds Musculoskeletal:no cyanosis of digits and no clubbing  NEURO: alert & oriented x 3 with fluent speech, no focal motor/sensory deficits  LABORATORY DATA:  I have reviewed the data as listed CBC Latest Ref Rng & Units 01/29/2019 01/07/2019 12/17/2018  WBC 4.0 -  10.5 K/uL 3.2(L) 3.4(L) 4.1  Hemoglobin 12.0 - 15.0 g/dL 11.3(L) 11.8(L) 11.1(L)  Hematocrit 36.0 - 46.0 % 33.0(L) 35.4(L) 33.2(L)  Platelets 150 - 400 K/uL 142(L) 144(L) 148(L)     CMP Latest Ref Rng & Units 01/29/2019 01/07/2019 12/17/2018  Glucose 70 - 99 mg/dL 121(H) 83 87  BUN 8 - 23 mg/dL _0 Creatinine 0.44 - 1.00 mg/dL 0.93 0.88 0.84  Sodium 135 - 145 mmol/L 140 140 139  Potassium 3.5 - 5.1 mmol/L 3.9 4.1 4.0  Chloride 98 - 111 mmol/L 107 107 106  CO2 22 - 32 mmol/L _1 Calcium 8.9 - 10.3 mg/dL 8.9 9.2 9.4  Total Protein 6.5 - 8.1 g/dL 6.2(L) 6.5 6.3(L)  Total Bilirubin 0.3 - 1.2 mg/dL 0.8 0.6 0.7  Alkaline Phos 38 - 126 U/L 46 49 50  AST 15 - 41 U/L _2 ALT 0 - 44 U/L _3 RADIOGRAPHIC STUDIES: I have personally reviewed the radiological images as listed and agreed with the findings in the report. No results found.   ASSESSMENT & PLAN:  ANNALISSE MINKOFF is a 78 y.o. female with   1. IgG kappa  Multiple myeloma s/p Auto SCT, intermediate risk, relapsed in 01/2016 -Diagnosed in 05/2012. Treated with induction chemo, radiation and BM transplant, but unfortunately progressed while on maintenance Revlimid 01/2016. She is currently onDaratumumabinfusion every 3 weeksmonotherapy -Ipreviouslystopped heroraldexadue to her high risk of fracture, and well controlled MM -Ipreviouslychanged her treatment to every 4 weeks due to COVID-19 pandemic, and changed back to every 3 weeks in early June  -She is clinically stable. Labs reviewed, WBC 3.2, Hg 11.3, plt 142K, lymphocytes 0.5, BG 121, protein 6.2. MM panel still pending.  -We previously discussed considering increase her Dara infusion back to every 2 weeks, due to her slight disease progression, symptomatic bone lesion in left petrous apex.  -She is still considering attending her [place] family trip. I again discussed her risk of infection and advised her to continue COVID precautions and social distancing and take breaks when driving. She understands and plans to leave tomorrow.  -She will take her dose of antibiotics if needed, I advised her to use local ED if she develops fever or other severe symptoms or come back sooner.   -F/u on 8/10 after she returns. Will change Dara ti every 2 week after next infusion   2. Left petrous apex bone lesion -Her inner ear issues and dizziness progressed and she developed mild balance issue and nausea  -Her MRI brain from 01/07/19 showed abnormal osseous lesion measuring up to approximately 5.9 cm centered at the left petrous apex. This most likely is the cause of her underlying left ear and balance symptoms. -Given she is symptomatic, she underwent target RT with Dr Tammi Klippel 7/1-7/15. Per patient she does not feel any changes in her symptoms.  -Will scan her in 2-3 months  3. History of multiple pneumonia andbronchitis -She has had multiple episodes of pneumonia, required hospitalization,and  bronchitis, required antibiotics -She has recovered well, will continue monitoring. I encouraged her to do spirometry,and exercise. -Her vaccines are up-to-date.  4. Pancytopenia -Secondary to [place].WBC 3.2, Hg 11.3, PLT 142K, ANC normal on 01/29/19  5. CHF, CAD, inferior STEMI in 02/2014, HLD -She is on ASA, BB, statin -f/u with Dr. Meda Coffee and continue medications -BP at142/76(01/29/19), will repeat in infusion room today  6. Osteoporosis with multiple fractures -Last DEXA in 2015  showed worsening osteoporosis. She declined repeat DEXA. -She had a fall induced fracture in 01/2018. -She was on Zometa for 4 years, stopped after April 2018  7. Peripheral neuropathy in feet, G1-2 -Secondary to previous chemo. -She previously declined Gabapentin  -I will monitor  8. Goal of care discussion  -she is full code now   Plan -Labs reviewed and adequate to proceed with Dara today -Lab, f/u and Dara on 8/10 -She will proceed with [place] trip and take antibiotics with her in case she needs it. She understands her risk of infection.   No problem-specific Assessment & Plan notes found for this encounter.   No orders of the defined types were placed in this encounter.  All questions were answered. The patient knows to call the clinic with any problems, questions or concerns. No barriers to learning was detected. I spent 20 minutes counseling the patient face to face. The total time spent in the appointment was 25 minutes and more than 50% was on counseling and review of test results     [name], MD 01/29/2019   I, Joslyn Devon, am acting as scribe for [name], MD.   I have reviewed the above documentation for accuracy and completeness, and I agree with the above.

## 2019-01-28 ENCOUNTER — Other Ambulatory Visit: Payer: Medicare Other

## 2019-01-28 ENCOUNTER — Ambulatory Visit: Payer: Medicare Other

## 2019-01-28 ENCOUNTER — Ambulatory Visit: Payer: Medicare Other | Admitting: Hematology

## 2019-01-29 ENCOUNTER — Inpatient Hospital Stay (HOSPITAL_BASED_OUTPATIENT_CLINIC_OR_DEPARTMENT_OTHER): Payer: Medicare Other | Admitting: Hematology

## 2019-01-29 ENCOUNTER — Inpatient Hospital Stay: Payer: Medicare Other

## 2019-01-29 ENCOUNTER — Other Ambulatory Visit: Payer: Self-pay

## 2019-01-29 ENCOUNTER — Inpatient Hospital Stay: Payer: Medicare Other | Attending: Hematology

## 2019-01-29 VITALS — BP 119/62 | HR 81 | Temp 98.3°F | Resp 18 | Ht 66.5 in | Wt 117.8 lb

## 2019-01-29 DIAGNOSIS — I252 Old myocardial infarction: Secondary | ICD-10-CM | POA: Diagnosis not present

## 2019-01-29 DIAGNOSIS — D61818 Other pancytopenia: Secondary | ICD-10-CM | POA: Diagnosis not present

## 2019-01-29 DIAGNOSIS — E785 Hyperlipidemia, unspecified: Secondary | ICD-10-CM | POA: Diagnosis not present

## 2019-01-29 DIAGNOSIS — Z7982 Long term (current) use of aspirin: Secondary | ICD-10-CM | POA: Insufficient documentation

## 2019-01-29 DIAGNOSIS — I5042 Chronic combined systolic (congestive) and diastolic (congestive) heart failure: Secondary | ICD-10-CM | POA: Insufficient documentation

## 2019-01-29 DIAGNOSIS — G629 Polyneuropathy, unspecified: Secondary | ICD-10-CM | POA: Insufficient documentation

## 2019-01-29 DIAGNOSIS — Z9484 Stem cells transplant status: Secondary | ICD-10-CM | POA: Insufficient documentation

## 2019-01-29 DIAGNOSIS — Z923 Personal history of irradiation: Secondary | ICD-10-CM | POA: Insufficient documentation

## 2019-01-29 DIAGNOSIS — C9000 Multiple myeloma not having achieved remission: Secondary | ICD-10-CM | POA: Diagnosis not present

## 2019-01-29 DIAGNOSIS — C9001 Multiple myeloma in remission: Secondary | ICD-10-CM | POA: Diagnosis not present

## 2019-01-29 DIAGNOSIS — Z79899 Other long term (current) drug therapy: Secondary | ICD-10-CM | POA: Diagnosis not present

## 2019-01-29 DIAGNOSIS — Z5112 Encounter for antineoplastic immunotherapy: Secondary | ICD-10-CM | POA: Diagnosis not present

## 2019-01-29 DIAGNOSIS — Z9221 Personal history of antineoplastic chemotherapy: Secondary | ICD-10-CM | POA: Diagnosis not present

## 2019-01-29 LAB — CBC WITH DIFFERENTIAL (CANCER CENTER ONLY)
Abs Immature Granulocytes: 0.01 10*3/uL (ref 0.00–0.07)
Basophils Absolute: 0 10*3/uL (ref 0.0–0.1)
Basophils Relative: 1 %
Eosinophils Absolute: 0.2 10*3/uL (ref 0.0–0.5)
Eosinophils Relative: 5 %
HCT: 33 % — ABNORMAL LOW (ref 36.0–46.0)
Hemoglobin: 11.3 g/dL — ABNORMAL LOW (ref 12.0–15.0)
Immature Granulocytes: 0 %
Lymphocytes Relative: 14 %
Lymphs Abs: 0.5 10*3/uL — ABNORMAL LOW (ref 0.7–4.0)
MCH: 35.4 pg — ABNORMAL HIGH (ref 26.0–34.0)
MCHC: 34.2 g/dL (ref 30.0–36.0)
MCV: 103.4 fL — ABNORMAL HIGH (ref 80.0–100.0)
Monocytes Absolute: 0.3 10*3/uL (ref 0.1–1.0)
Monocytes Relative: 10 %
Neutro Abs: 2.3 10*3/uL (ref 1.7–7.7)
Neutrophils Relative %: 70 %
Platelet Count: 142 10*3/uL — ABNORMAL LOW (ref 150–400)
RBC: 3.19 MIL/uL — ABNORMAL LOW (ref 3.87–5.11)
RDW: 14.1 % (ref 11.5–15.5)
WBC Count: 3.2 10*3/uL — ABNORMAL LOW (ref 4.0–10.5)
nRBC: 0 % (ref 0.0–0.2)

## 2019-01-29 LAB — CMP (CANCER CENTER ONLY)
ALT: 11 U/L (ref 0–44)
AST: 19 U/L (ref 15–41)
Albumin: 3.8 g/dL (ref 3.5–5.0)
Alkaline Phosphatase: 46 U/L (ref 38–126)
Anion gap: 10 (ref 5–15)
BUN: 21 mg/dL (ref 8–23)
CO2: 23 mmol/L (ref 22–32)
Calcium: 8.9 mg/dL (ref 8.9–10.3)
Chloride: 107 mmol/L (ref 98–111)
Creatinine: 0.93 mg/dL (ref 0.44–1.00)
GFR, Est AFR Am: >60 mL/min (ref >60)
GFR, Estimated: 60 mL/min — ABNORMAL LOW (ref >60)
Glucose, Bld: 121 mg/dL — ABNORMAL HIGH (ref 70–99)
Potassium: 3.9 mmol/L (ref 3.5–5.1)
Sodium: 140 mmol/L (ref 135–145)
Total Bilirubin: 0.8 mg/dL (ref 0.3–1.2)
Total Protein: 6.2 g/dL — ABNORMAL LOW (ref 6.5–8.1)

## 2019-01-29 MED ORDER — MONTELUKAST SODIUM 10 MG PO TABS
ORAL_TABLET | ORAL | Status: AC
  Filled 2019-01-29: qty 1

## 2019-01-29 MED ORDER — DIPHENHYDRAMINE HCL 25 MG PO CAPS
ORAL_CAPSULE | ORAL | Status: AC
  Filled 2019-01-29: qty 2

## 2019-01-29 MED ORDER — ACETAMINOPHEN 325 MG PO TABS
650.0000 mg | ORAL_TABLET | Freq: Once | ORAL | Status: AC
  Administered 2019-01-29: 650 mg via ORAL

## 2019-01-29 MED ORDER — SODIUM CHLORIDE 0.9 % IV SOLN
16.0000 mg/kg | Freq: Once | INTRAVENOUS | Status: AC
  Administered 2019-01-29: 900 mg via INTRAVENOUS
  Filled 2019-01-29: qty 40

## 2019-01-29 MED ORDER — METHYLPREDNISOLONE SODIUM SUCC 125 MG IJ SOLR
125.0000 mg | Freq: Once | INTRAMUSCULAR | Status: AC
  Administered 2019-01-29: 125 mg via INTRAVENOUS

## 2019-01-29 MED ORDER — ACETAMINOPHEN 325 MG PO TABS
ORAL_TABLET | ORAL | Status: AC
  Filled 2019-01-29: qty 2

## 2019-01-29 MED ORDER — MONTELUKAST SODIUM 10 MG PO TABS
10.0000 mg | ORAL_TABLET | Freq: Once | ORAL | Status: AC
  Administered 2019-01-29: 10 mg via ORAL

## 2019-01-29 MED ORDER — METHYLPREDNISOLONE SODIUM SUCC 125 MG IJ SOLR
INTRAMUSCULAR | Status: AC
  Filled 2019-01-29: qty 2

## 2019-01-29 MED ORDER — DIPHENHYDRAMINE HCL 25 MG PO CAPS
50.0000 mg | ORAL_CAPSULE | Freq: Once | ORAL | Status: AC
  Administered 2019-01-29: 50 mg via ORAL

## 2019-01-29 MED ORDER — SODIUM CHLORIDE 0.9 % IV SOLN
Freq: Once | INTRAVENOUS | Status: AC
  Administered 2019-01-29: 10:00:00 via INTRAVENOUS
  Filled 2019-01-29: qty 250

## 2019-01-29 NOTE — Patient Instructions (Signed)
Coronavirus (COVID-19) Are you at risk?  Are you at risk for the Coronavirus (COVID-19)?  To be considered HIGH RISK for Coronavirus (COVID-19), you have to meet the following criteria:  . Traveled to [place], [place], [place], [place] or [place]; or in the [place] to [place], [place], [place], or [place]; and have fever, cough, and shortness of breath within the last 2 weeks of travel OR . Been in close contact with a person diagnosed with COVID-19 within the last 2 weeks and have fever, cough, and shortness of breath . IF YOU DO NOT MEET THESE CRITERIA, YOU ARE CONSIDERED LOW RISK FOR COVID-19.  What to do if you are HIGH RISK for COVID-19?  . If you are having a medical emergency, call 911. . Seek medical care right away. Before you go to a doctor's office, urgent care or emergency department, call ahead and tell them about your recent travel, contact with someone diagnosed with COVID-19, and your symptoms. You should receive instructions from your physician's office regarding next steps of care.  . When you arrive at healthcare provider, tell the healthcare staff immediately you have returned from visiting [place], [place], [place], [place] or [place]; or traveled in the [place] to [place], [place], [place], or [place]; in the last two weeks or you have been in close contact with a person diagnosed with COVID-19 in the last 2 weeks.   . Tell the health care staff about your symptoms: fever, cough and shortness of breath. . After you have been seen by a medical provider, you will be either: o Tested for (COVID-19) and discharged home on quarantine except to seek medical care if symptoms worsen, and asked to  - Stay home and avoid contact with others until you get your results (4-5 days)  - Avoid travel on public transportation if possible (such as bus, train, or airplane) or o Sent to the Emergency Department by EMS for evaluation, COVID-19 testing, and possible  admission depending on your condition and test results.  What to do if you are LOW RISK for COVID-19?  Reduce your risk of any infection by using the same precautions used for avoiding the common cold or flu:  . Wash your hands often with soap and warm water for at least 20 seconds.  If soap and water are not readily available, use an alcohol-based hand sanitizer with at least 60% alcohol.  . If coughing or sneezing, cover your mouth and nose by coughing or sneezing into the elbow areas of your shirt or coat, into a tissue or into your sleeve (not your hands). . Avoid shaking hands with others and consider head nods or verbal greetings only. . Avoid touching your eyes, nose, or mouth with unwashed hands.  . Avoid close contact with people who are sick. . Avoid places or events with large numbers of people in one location, like concerts or sporting events. . Carefully consider travel plans you have or are making. . If you are planning any travel outside or inside the [place], visit the CDC's Travelers' Health webpage for the latest health notices. . If you have some symptoms but not all symptoms, continue to monitor at home and seek medical attention if your symptoms worsen. . If you are having a medical emergency, call 911.   ADDITIONAL HEALTHCARE OPTIONS FOR PATIENTS  [facility] / e-Visit: [email or web address]         [facility]: [phone]  [facility]: [phone]                   [facility]: [phone]   [facility] Discharge Instructions for Patients Receiving Chemotherapy  Today you received the following chemotherapy agents Darzalex  To help prevent nausea and vomiting after your treatment, we encourage you to take your nausea medication as directed.    If you develop nausea and vomiting that is not controlled by your nausea medication, call the clinic.   BELOW ARE  SYMPTOMS THAT SHOULD BE REPORTED IMMEDIATELY:  *FEVER GREATER THAN 100.5 F  *CHILLS WITH OR WITHOUT FEVER  NAUSEA AND VOMITING THAT IS NOT CONTROLLED WITH YOUR NAUSEA MEDICATION  *UNUSUAL SHORTNESS OF BREATH  *UNUSUAL BRUISING OR BLEEDING  TENDERNESS IN MOUTH AND THROAT WITH OR WITHOUT PRESENCE OF ULCERS  *URINARY PROBLEMS  *BOWEL PROBLEMS  UNUSUAL RASH Items with * indicate a potential emergency and should be followed up as soon as possible.  Feel free to call the clinic should you have any questions or concerns. The clinic phone number is [phone].  Please show the CHEMO ALERT CARD at check-in to the Emergency Department and triage nurse.   

## 2019-01-31 ENCOUNTER — Encounter: Payer: Self-pay | Admitting: Hematology

## 2019-01-31 LAB — PROTEIN ELECTROPHORESIS, SERUM
A/G Ratio: 1.8 — ABNORMAL HIGH (ref 0.7–1.7)
Albumin ELP: 3.8 g/dL (ref 2.9–4.4)
Alpha-1-Globulin: 0.2 g/dL (ref 0.0–0.4)
Alpha-2-Globulin: 0.6 g/dL (ref 0.4–1.0)
Beta Globulin: 0.6 g/dL — ABNORMAL LOW (ref 0.7–1.3)
Gamma Globulin: 0.6 g/dL (ref 0.4–1.8)
Globulin, Total: 2.1 g/dL — ABNORMAL LOW (ref 2.2–3.9)
M-Spike, %: 0.3 g/dL — ABNORMAL HIGH
Total Protein ELP: 5.9 g/dL — ABNORMAL LOW (ref 6.0–8.5)

## 2019-02-01 LAB — KAPPA/LAMBDA LIGHT CHAINS
Kappa free light chain: 42.9 mg/L — ABNORMAL HIGH (ref 3.3–19.4)
Kappa, lambda light chain ratio: 17.16 — ABNORMAL HIGH (ref 0.26–1.65)
Lambda free light chains: 2.5 mg/L — ABNORMAL LOW (ref 5.7–26.3)

## 2019-02-02 LAB — IFE, DARA-SPECIFIC, SERUM
IgA: <5 mg/dL — ABNORMAL LOW (ref 64–422)
IgG (Immunoglobin G), Serum: 778 mg/dL (ref 586–1602)
IgM (Immunoglobulin M), Srm: 15 mg/dL — ABNORMAL LOW (ref 26–217)

## 2019-02-18 NOTE — Progress Notes (Signed)
[place]   [phone] [phone]   Clinic Follow up Note   Patient Care Team: [name], MD as PCP - General (Family Medicine) [name], MD as Attending Physician (Obstetrics and Gynecology) [name], MD as Consulting Physician (Internal Medicine) [name], MD as Consulting Physician (Hematology) [name], MD as Consulting Physician (Ophthalmology) [name], MD as Consulting Physician (Cardiology) [name], MD as Consulting Physician (Dermatology) [name] (Dentistry) [name], DPM as Consulting Physician (Podiatry) Pleasant, Eppie Gibson, RN as [place] Management Pleasant, Eppie Gibson, RN as [place] Management  Date of Service:  02/22/2019  CHIEF COMPLAINT: F/u on MM  SUMMARY OF ONCOLOGIC HISTORY: Oncology History Overview Note  Multiple myeloma [facility])   Staging form: Multiple Myeloma, AJCC 6th Edition   - Clinical: Stage IIIA - Signed by [name], MD on 09/04/2013    Multiple myeloma ([place])  05/26/2012 Initial Diagnosis   Presenting IgG was 4,040 mg/dL on 06/05/2012 (IgA 35; IgM 34); kappa free light chain 34.4 mg/dL, lambda 0.00, kappa:lambda ratio of 34.75; SPEP with M-spike of 2.60.   06/30/2012 Imaging   Numerous lytic lesions throughout the calvarium.  Lytic lesion within the left lateral scapula.  Findings most compatible with metastases or myeloma. Multiple mid and lower thoracic compression fractures.  Slight compression through the endplates at L3.   54/65/6812 Bone Marrow Biopsy   Bone marrow biopsy showed 49% plasma cell. Normal classical cytogenetics; however, myeloma FISH panel showed 13q- (intermediate risk)       07/14/2012 - 07/27/2012 Radiation Therapy   Palliative radiation 20 Gy over 10 fractions between to thoracic spine cord compression and symptomatic left scapula lytic lesion.   08/10/2012 - 11/2012  Chemotherapy   Started SQ Velcade once weekly, 3 weeks on, 1 weeks off; daily Revlimid d1-21, 7 days off; and Dexamethasone 54m PO weekly.    02/12/2013 Bone Marrow Transplant   [facility]    06/14/2013 Tumor Marker   IgG  1830,  Kappa:lamba ratio, 1.69 (baseline was 34.75 or   M-spike 0.28 (baseline of 2.6 or  89.3% of baseline).     06/25/2013 -  Chemotherapy   Started zometa 3.5 mg monthly    09/01/2013 - 01/2014 Chemotherapy   Maintenance therapy with revlimid 554mdaily for 14 days and then off for 7 (decreased from 21 days on and 7 days off based on neutropenia). Stopped due to disease progression 01/2017   09/13/2013 - 09/17/2014 Hospital Admission   Hospital admission for pneumonia   12/20/2013 Treatment Plan Change   Maintenance therapy decreased to 2.5 mg daily for 21 days and then off for 7 days based on low counts.    01/25/2014 Tumor Marker   IgG 1360 M spike 0.5   03/01/2014 - 03/05/2014 Hospital Admission   [facility], received thrombolytic therapy, cath showed 60% prox RCA, mid-distal RCA 50%, Echo normal, no stents.   04/11/2014 - 08/07/2014 Chemotherapy   Continue Revlimid at 2.5 mg 3 weeks on 1 week off   08/08/2014 - 08/13/2014 Hospital Admission   Hospital admission for pneumonia. Her Revlimid was held.   08/29/2014 - 09/14/2014 Chemotherapy   Maintenance Revlimid restarted   09/14/2014 - 09/17/2014 Hospital Admission   [place].   11/13/2014 - 12/25/2015 Chemotherapy   Maintenance Revlimid restarted,  changed to 2.32m daily, 2 weks on, 1 week off from 12/15/2014, stopped due to disease progression    01/24/2016 Progression   Patient is M protein has gradually increased to 1.0g, repeated a bone marrow biopsy showed plasma cell 10-30%   02/27/2016 - 03/07/2016 Chemotherapy   Pomalidomide 4 mg daily on day 1-21, dexamethasone  40 mg on day 1, 8 and 15, every 28 days, started on 02/27/2016, daratumumab weekly started on 03/07/2016, held after first dose, due to hospitalization and a severe pancytopenia, pomalidomide was stopped afterwards    03/10/2016 - 03/29/2016 Hospital Admission   Pt was admitted for sepsis from pneumonia, and severe pancytopenia. She required ICU stay for a few days due to hypotension, developed b/l pleural effusion required diuretics and b/l thoracentesis. She also required blood and plt transfusion, and prolonged neupogen injection for severe neutropenia. She was discharged home after 3 weeks hospital stay    04/26/2016 -  Chemotherapy   Daratumumab restarted on 04/26/2016, Velcade 1.317mm2 and dexa 2049meekly start on 11/3, velcade held since 06/28/2016 due to CHF, dexa stopped on12/11/2017 due to high risk of fracture    08/26/2016 Echocardiogram   - Left ventricle: LVEF is approximately 35% with diffuse diffuse   hypokinesis with severe hypokinesis/ akinesis of the   inferior/inferoseptal walls. In comparison to echo images from   December 2017, there does not appear to be significant change The   cavity size was normal. Wall thickness was normal. Doppler   parameters are consistent with abnormal left ventricular   relaxation (grade 1 diastolic dysfunction). - Aortic valve: AV is thickened. 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 Present in echo from December 2017 There was   trivial regurgitation. - Right ventricle: Systolic function was mildly reduced. - Right atrium: The atrium was mildly dilated. - Pericardium, extracardiac: A trivial pericardial effusion was   identified.   12/03/2016 Echocardiogram   -Left Ventricle: Systolic function was   mildly to moderately reduced. The estimated ejection fraction was   in the range of 40% to 45%. Diffuse hypokinesis. Doppler   parameters are consistent with abnormal left  ventricular   relaxation (grade 1 diastolic dysfunction). Doppler parameters   are consistent with indeterminate ventricular filling pressure. - Left atrium: The atrium was severely dilated. - Tricuspid valve: There was trivial regurgitation. - Pulmonary arteries: Systolic pressure was within the normal   range. PA peak pressure: 33 mm Hg (S). - Global longitudinal strain -10.5% (abnormal)   03/12/2017 Echocardiogram   ECHO 03/12/17 Study Conclusions - Left ventricle: The cavity size was normal. Wall thickness was   increased in a pattern of mild LVH. Systolic function was normal.   The estimated ejection fraction was in the range of 50% to 55%.   There is hypokinesis of the basalinferior myocardium. Doppler   parameters are consistent with abnormal left ventricular   relaxation (grade 1 diastolic dysfunction). - Aortic valve: There was trivial regurgitation. - Mitral valve: Calcified annulus. Mildly thickened leaflets .   There was trivial regurgitation. - Tricuspid valve: There was mild regurgitation. Impressions: - There has been mild improvement in EF since prior study.    12/12/2017 - 12/15/2017 Hospital Admission   Admission diagnosis: Pnuemonia and acute hypoxic respiratory failure Additional comments: treated for pneumoania and acute hypoxic respiratory failure before she was discharged on 12/15/17 with oral anitbiotics. She did not require home oxygen.    12/30/2017 - 01/02/2018 Hospital Admission  Admission diagnosis: Pnuemonia  Additional comments: She was again hospitalized on 12/30/17 for pneumonia, ID work up was otherwise negative, she was discharged home with oral antibiotics.    04/01/2018 Imaging   04/01/2018 CXR IMPRESSION: Persisting airspace disease superimposed on chronic changes, suggesting pneumonia given the history. Followup PA and lateral chest X-ray is recommended in 3-4 weeks following therapy to ensure resolution and exclude underlying malignancy.   Redemonstration of mid and lower thoracic compression fractures with associated thoracic kyphosis.   04/23/2018 Echocardiogram   04/23/2018 ECHO LV EF: 45% -   50%   01/07/2019 Imaging   MRI Brain 01/07/19 IMPRESSION: MRI HEAD IMPRESSION:   1. 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 Lesion closely approximates the left inner ear structures and involves the left Meckel's cave and left fifth cranial nerve, and most likely is the causative etiology for patient's underlying left ear and facial symptoms. Follow-up examination with postcontrast imaging suggested for complete evaluation. Inclusion of IAC sequences would likely be helpful for visualization. 2. Additional 2.7 cm lesion involving the right occipital calvarium with adjacent marrow signal abnormality, also likely related to multiple myeloma. Metastatic disease would be the primary differential consideration. 3. Underlying age-appropriate cerebral atrophy with mild chronic microvascular ischemic disease. No other acute intracranial abnormality.   MRA HEAD IMPRESSION:   1. Negative intracranial MRA with no large vessel occlusion, hemodynamically significant stenosis, or other acute vascular abnormality. 2. Petrous and cavernous left ICA is partially encased and surrounded by the left petrous apex lesion without significant irregularity or stenosis. 3. 2 mm right paraophthalmic aneurysm.   MRA NECK IMPRESSION:   Normal MRA of the neck with wide patency of the carotid and vertebral arteries bilaterally. No hemodynamically significant stenosis or other acute vascular abnormality identified.    01/14/2019 - 01/27/2019 Radiation Therapy   Radaition to Left petrous apex bone lesion 01/14/19-01/27/19 with Dr. Tammi Klippel.       CURRENT THERAPY:  Daratumumabevery 3 weeks. Dexamethasone a day  before treatment.Dexa stopped in 07/2018 due to well controlled MM and high risk of fracture. Dara reduced to every 4 weeks due to COVID-19 starting 10/23/18.Returned to every 3 weeks starting 12/17/18. Restarted every 2 weeks starting 02/22/19 -Target RTto skull base bone lesionwith Dr Tammi Klippel 01/13/19-01/27/19  INTERVAL HISTORY:  Shannon Rush is here for a follow up and treatment. She presents to the clinic alone. She notes she is doing well and her trip to [place] went well. She denies having fever or sign of infection. She notes bruise and swelling of medial lower left leg from accident 2 weeks ago. She feel minimal improvement of her balance and inner ear issues.     REVIEW OF SYSTEMS:   Constitutional: Denies fevers, chills or abnormal weight loss Eyes: Denies blurriness of vision Ears, nose, mouth, throat, and face: Denies mucositis or sore throat (+) Inner ear hearing change stable, mild imbalance  Respiratory: Denies cough, dyspnea or wheezes Cardiovascular: Denies palpitation, chest discomfort or lower extremity swelling Gastrointestinal:  Denies nausea, heartburn or change in bowel habits Skin: Denies abnormal skin rashes Lymphatics: Denies new lymphadenopathy or easy bruising Neurological:Denies numbness, tingling or new weaknesses Behavioral/Psych: Mood is stable, no new changes  All other systems were reviewed with the patient and are negative.  MEDICAL HISTORY:  Past Medical History:  Diagnosis Date  . CAD (coronary artery disease)    a. inf STEMI (  in [place] >>> [facility]) >>> LHC (8/15- [place]):  pRCA 60%, mid to dist RCA 50% >>> med rx  . Cardiomyopathy ([place])   . Cataract   . Chronic combined systolic and diastolic CHF (congestive heart failure) ([place])   . Cord compression ([place]) 07/13/12   MRI- diffuse myeloma involvement of T-L spine  . History of radiation therapy 07/13/12-07/27/12   spinal cord compression T3-T10,left scapula  . Hx of echocardiogram     Echo (03/02/14-done at [facility]):  Normal LVEF without significant regional wall motion abnormalities. Mild   . Multiple myeloma ([place]) 07/01/2012  . MVP (mitral valve prolapse)    a. trivial by echo 06/2017.  . OSTEOPOROSIS 06/11/2010   Multiple compression fractures; and spontaneous fracture of sternum Qualifier: Diagnosis of  By: [name], [name]     . Thoracic kyphosis 07/13/12   per MRI scan  . Unspecified deficiency anemia     SURGICAL HISTORY: Past Surgical History:  Procedure Laterality Date  . APPENDECTOMY    . CATARACT EXTRACTION, BILATERAL    . CESAREAN SECTION     x2   . COLONOSCOPY  2007   neg with Dr. [name]  . ELBOW SURGERY    . HIP SURGERY  2009   left  . TUBAL LIGATION      I have reviewed the social history and family history with the patient and they are unchanged from previous note.  ALLERGIES:  is allergic to zithromax [azithromycin]; zosyn [piperacillin sod-tazobactam so]; and quinolones.  MEDICATIONS:  Current Outpatient Medications  Medication Sig Dispense Refill  . acetaminophen (TYLENOL) 500 MG tablet Take 500 mg by mouth every 6 (six) hours as needed for moderate pain or fever.    Marland Kitchen acyclovir (ZOVIRAX) 400 MG tablet Take 1 tablet (400 mg total) by mouth 2 (two) times daily. 180 tablet 3  . aspirin EC 81 MG tablet Take 1 tablet (81 mg total) by mouth daily. 90 tablet 3  . atorvastatin (LIPITOR) 20 MG tablet TAKE 1 TABLET BY MOUTH  DAILY 90 tablet 11  . Calcium Carbonate-Vit D-Min (CALCIUM 600+D PLUS MINERALS) 600-400 MG-UNIT TABS Take by mouth.    . metoprolol succinate (TOPROL-XL) 25 MG 24 hr tablet      No current facility-administered medications for this visit.     PHYSICAL EXAMINATION: ECOG PERFORMANCE STATUS: 1 - Symptomatic but completely ambulatory  Vitals:   02/22/19 1106  BP: 115/67  Pulse: 87  Resp: 18  Temp: 98.5 F (36.9 C)  SpO2: 99%   Filed Weights   02/22/19 1106  Weight: 123 lb 1.6 oz (55.8  kg)    GENERAL:alert, no distress and comfortable SKIN: skin color, texture, turgor are normal, no rashes or significant lesions EYES: normal, Conjunctiva are pink and non-injected, sclera clear  NECK: supple, thyroid normal size, non-tender, without nodularity LYMPH:  no palpable lymphadenopathy in the cervical, axillary  LUNGS: clear to auscultation and percussion with normal breathing effort HEART: regular rate & rhythm and no murmurs and no lower extremity edema ABDOMEN:abdomen soft, non-tender and normal bowel sounds Musculoskeletal:no cyanosis of digits and no clubbing  NEURO: alert & oriented x 3 with fluent speech, no focal motor/sensory deficits  LABORATORY DATA:  I have reviewed the data as listed CBC Latest Ref Rng & Units 02/22/2019 01/29/2019 01/07/2019  WBC 4.0 - 10.5 K/uL 3.8(L) 3.2(L) 3.4(L)  Hemoglobin 12.0 - 15.0 g/dL 10.4(L) 11.3(L) 11.8(L)  Hematocrit 36.0 - 46.0 % 31.1(L) 33.0(L) 35.4(L)  Platelets 150 - 400 K/uL 169 142(L) 144(L)     CMP Latest Ref Rng & Units 02/22/2019 01/29/2019 01/07/2019  Glucose 70 - 99 mg/dL 84 121(H) 83  BUN 8 - 23 mg/dL 20 21 17   Creatinine 0.44 - 1.00 mg/dL 0.91 0.93 0.88  Sodium 135 - 145 mmol/L 140 140 140  Potassium 3.5 - 5.1 mmol/L 3.9 3.9 4.1  Chloride 98 - 111 mmol/L 107 107 107  CO2 22 - 32 mmol/L 21(L) 23 24  Calcium 8.9 - 10.3 mg/dL 9.2 8.9 9.2  Total Protein 6.5 - 8.1 g/dL 6.2(L) 6.2(L) 6.5  Total Bilirubin 0.3 - 1.2 mg/dL 0.7 0.8 0.6  Alkaline Phos 38 - 126 U/L 54 46 49  AST 15 - 41 U/L 22 19 20   ALT 0 - 44 U/L 14 11 12       RADIOGRAPHIC STUDIES: I have personally reviewed the radiological images as listed and agreed with the findings in the report. No results found.   ASSESSMENT & PLAN:  Shannon Rush is a 78 y.o. female with    1. IgG kappa Multiple myeloma s/p Auto SCT, intermediate risk, relapsed in 01/2016 -Diagnosed in 05/2012. Treated with induction chemo, radiation and BM transplant, but unfortunately  progressed while on maintenance Revlimid 01/2016. She is currently onDaratumumabinfusion every 3 weeksmonotherapy -Ipreviouslystopped heroraldexadue to her high risk of fracture, and well controlled MM -Ipreviouslychanged her treatment to every 4 weeks due to COVID-19 pandemic, and changed back to every 3 weeks in early June.  -We previously discussed considering increase her Dara infusion back to every 2 weeks, due to her slight disease progression, symptomatic bone lesion in left petrous apex. -She has recent injury with a healing wound and mild swelling of medial lower left leg 2 weeks ago. I recommend she elevated her feet when sitting. No need antibiotics now  -She is otherwise clinically stable. Labs reviewed, CBC and CMP WNL except WBC 3.8, Hg 10.4. Overall adequate to proceed with Dara today. Will change her treatment to every 2 weeks form now on -I discussed precautions during this COVID pandemic. She would like to go grocery shopping herself, she knows to use mask and gloves and only go at Senior time at opening.  -F/u in 4 weeks   2. Left petrous apex bone lesion -Her inner ear issues and dizziness progressed and she developedmildbalance issueand nausea  -Her MRI brain from 01/07/19 showedabnormal osseous lesion measuring up to approximately 5.9 cm centered at the left petrous apex. Thismost likely is the cause of her underlyingleft ear and balancesymptoms. -Given she is symptomatic, she underwent target RT with Dr Tammi Klippel 7/1-7/15. Per patient she does not feel any changes in her symptoms.  -Will scan her in 2-3 months -She has stable muffled hearing and mild imbalance issue.   3. History of multiple pneumonia andbronchitis -She has had multiple episodes of pneumonia, required hospitalization,and bronchitis, required antibiotics -She has recovered well, will continue monitoring. I encouraged her to do spirometry,and exercise. -Her vaccines are up-to-date.  4.  Pancytopenia -Secondary to [place].WBC 3.8, Hg 10.4, PLT and ANC normal today (02/22/19)  5. CHF, CAD, inferior STEMI in 02/2014, HLD -She is on ASA, BB, statin -f/u with Dr. Meda Coffee and continue medications -BP at115/67(02/22/19)  6. Osteoporosis with multiple fractures -Last DEXA in 2015 showed worsening osteoporosis. She declined repeat DEXA. -She had a fall induced fracture in 01/2018. -She was on Zometa for 4 years, stopped after April 2018  7. Peripheral neuropathy in feet, G1-2 -Secondary  to previous chemo. -She previously declined Gabapentin  -I will monitor  8. Goal of care discussion  -she is full code now   Plan -Labs reviewed and adequate to proceed with Dara today -Lab and dara in 2 and 4 weeks  -F/u in 4 weeks    No problem-specific Assessment & Plan notes found for this encounter.   No orders of the defined types were placed in this encounter.  All questions were answered. The patient knows to call the clinic with any problems, questions or concerns. No barriers to learning was detected. I spent 20 minutes counseling the patient face to face. The total time spent in the appointment was 25 minutes and more than 50% was on counseling and review of test results     [name], MD 02/22/2019   I, Joslyn Devon, am acting as scribe for [name], MD.   I have reviewed the above documentation for accuracy and completeness, and I agree with the above.

## 2019-02-22 ENCOUNTER — Other Ambulatory Visit: Payer: Self-pay

## 2019-02-22 ENCOUNTER — Inpatient Hospital Stay: Payer: Medicare Other

## 2019-02-22 ENCOUNTER — Inpatient Hospital Stay (HOSPITAL_BASED_OUTPATIENT_CLINIC_OR_DEPARTMENT_OTHER): Payer: Medicare Other | Admitting: Hematology

## 2019-02-22 ENCOUNTER — Inpatient Hospital Stay: Payer: Medicare Other | Attending: Hematology

## 2019-02-22 ENCOUNTER — Encounter: Payer: Self-pay | Admitting: Hematology

## 2019-02-22 VITALS — BP 115/67 | HR 87 | Temp 98.5°F | Resp 18 | Ht 66.5 in | Wt 123.1 lb

## 2019-02-22 VITALS — BP 115/59 | HR 73 | Temp 97.7°F | Resp 16

## 2019-02-22 DIAGNOSIS — C9001 Multiple myeloma in remission: Secondary | ICD-10-CM

## 2019-02-22 DIAGNOSIS — Z9221 Personal history of antineoplastic chemotherapy: Secondary | ICD-10-CM | POA: Diagnosis not present

## 2019-02-22 DIAGNOSIS — E785 Hyperlipidemia, unspecified: Secondary | ICD-10-CM | POA: Insufficient documentation

## 2019-02-22 DIAGNOSIS — Z7982 Long term (current) use of aspirin: Secondary | ICD-10-CM | POA: Insufficient documentation

## 2019-02-22 DIAGNOSIS — Z5112 Encounter for antineoplastic immunotherapy: Secondary | ICD-10-CM | POA: Diagnosis not present

## 2019-02-22 DIAGNOSIS — I252 Old myocardial infarction: Secondary | ICD-10-CM | POA: Insufficient documentation

## 2019-02-22 DIAGNOSIS — Z79899 Other long term (current) drug therapy: Secondary | ICD-10-CM | POA: Insufficient documentation

## 2019-02-22 DIAGNOSIS — C9000 Multiple myeloma not having achieved remission: Secondary | ICD-10-CM | POA: Insufficient documentation

## 2019-02-22 DIAGNOSIS — I2510 Atherosclerotic heart disease of native coronary artery without angina pectoris: Secondary | ICD-10-CM | POA: Diagnosis not present

## 2019-02-22 DIAGNOSIS — M810 Age-related osteoporosis without current pathological fracture: Secondary | ICD-10-CM | POA: Insufficient documentation

## 2019-02-22 DIAGNOSIS — D61818 Other pancytopenia: Secondary | ICD-10-CM

## 2019-02-22 DIAGNOSIS — Z923 Personal history of irradiation: Secondary | ICD-10-CM | POA: Insufficient documentation

## 2019-02-22 DIAGNOSIS — M818 Other osteoporosis without current pathological fracture: Secondary | ICD-10-CM

## 2019-02-22 LAB — CMP (CANCER CENTER ONLY)
ALT: 14 U/L (ref 0–44)
AST: 22 U/L (ref 15–41)
Albumin: 3.7 g/dL (ref 3.5–5.0)
Alkaline Phosphatase: 54 U/L (ref 38–126)
Anion gap: 12 (ref 5–15)
BUN: 20 mg/dL (ref 8–23)
CO2: 21 mmol/L — ABNORMAL LOW (ref 22–32)
Calcium: 9.2 mg/dL (ref 8.9–10.3)
Chloride: 107 mmol/L (ref 98–111)
Creatinine: 0.91 mg/dL (ref 0.44–1.00)
GFR, Est AFR Am: >60 mL/min (ref >60)
GFR, Estimated: >60 mL/min (ref >60)
Glucose, Bld: 84 mg/dL (ref 70–99)
Potassium: 3.9 mmol/L (ref 3.5–5.1)
Sodium: 140 mmol/L (ref 135–145)
Total Bilirubin: 0.7 mg/dL (ref 0.3–1.2)
Total Protein: 6.2 g/dL — ABNORMAL LOW (ref 6.5–8.1)

## 2019-02-22 LAB — CBC WITH DIFFERENTIAL (CANCER CENTER ONLY)
Abs Immature Granulocytes: 0.01 10*3/uL (ref 0.00–0.07)
Basophils Absolute: 0.1 10*3/uL (ref 0.0–0.1)
Basophils Relative: 1 %
Eosinophils Absolute: 0.3 10*3/uL (ref 0.0–0.5)
Eosinophils Relative: 8 %
HCT: 31.1 % — ABNORMAL LOW (ref 36.0–46.0)
Hemoglobin: 10.4 g/dL — ABNORMAL LOW (ref 12.0–15.0)
Immature Granulocytes: 0 %
Lymphocytes Relative: 15 %
Lymphs Abs: 0.6 10*3/uL — ABNORMAL LOW (ref 0.7–4.0)
MCH: 35.6 pg — ABNORMAL HIGH (ref 26.0–34.0)
MCHC: 33.4 g/dL (ref 30.0–36.0)
MCV: 106.5 fL — ABNORMAL HIGH (ref 80.0–100.0)
Monocytes Absolute: 0.5 10*3/uL (ref 0.1–1.0)
Monocytes Relative: 13 %
Neutro Abs: 2.4 10*3/uL (ref 1.7–7.7)
Neutrophils Relative %: 63 %
Platelet Count: 169 10*3/uL (ref 150–400)
RBC: 2.92 MIL/uL — ABNORMAL LOW (ref 3.87–5.11)
RDW: 15 % (ref 11.5–15.5)
WBC Count: 3.8 10*3/uL — ABNORMAL LOW (ref 4.0–10.5)
nRBC: 0 % (ref 0.0–0.2)

## 2019-02-22 MED ORDER — ACYCLOVIR 400 MG PO TABS: 400.0000 mg | ORAL_TABLET | Freq: Two times a day (BID) | ORAL | 3 refills | Status: AC

## 2019-02-22 MED ORDER — METHYLPREDNISOLONE SODIUM SUCC 125 MG IJ SOLR
125.0000 mg | Freq: Once | INTRAMUSCULAR | Status: AC
  Administered 2019-02-22: 125 mg via INTRAVENOUS

## 2019-02-22 MED ORDER — DIPHENHYDRAMINE HCL 25 MG PO CAPS
ORAL_CAPSULE | ORAL | Status: AC
  Filled 2019-02-22: qty 2

## 2019-02-22 MED ORDER — MONTELUKAST SODIUM 10 MG PO TABS
ORAL_TABLET | ORAL | Status: AC
  Filled 2019-02-22: qty 1

## 2019-02-22 MED ORDER — ACETAMINOPHEN 325 MG PO TABS
ORAL_TABLET | ORAL | Status: AC
  Filled 2019-02-22: qty 2

## 2019-02-22 MED ORDER — METHYLPREDNISOLONE SODIUM SUCC 125 MG IJ SOLR
INTRAMUSCULAR | Status: AC
  Filled 2019-02-22: qty 2

## 2019-02-22 MED ORDER — DIPHENHYDRAMINE HCL 25 MG PO CAPS
50.0000 mg | ORAL_CAPSULE | Freq: Once | ORAL | Status: AC
  Administered 2019-02-22: 50 mg via ORAL

## 2019-02-22 MED ORDER — ACETAMINOPHEN 325 MG PO TABS
650.0000 mg | ORAL_TABLET | Freq: Once | ORAL | Status: AC
  Administered 2019-02-22: 650 mg via ORAL

## 2019-02-22 MED ORDER — MONTELUKAST SODIUM 10 MG PO TABS
10.0000 mg | ORAL_TABLET | Freq: Once | ORAL | Status: AC
  Administered 2019-02-22: 10 mg via ORAL

## 2019-02-22 MED ORDER — SODIUM CHLORIDE 0.9 % IV SOLN
16.0000 mg/kg | Freq: Once | INTRAVENOUS | Status: AC
  Administered 2019-02-22: 900 mg via INTRAVENOUS
  Filled 2019-02-22: qty 40

## 2019-02-22 MED ORDER — SODIUM CHLORIDE 0.9 % IV SOLN
Freq: Once | INTRAVENOUS | Status: AC
  Administered 2019-02-22: 12:00:00 via INTRAVENOUS
  Filled 2019-02-22: qty 250

## 2019-02-22 NOTE — Patient Instructions (Signed)
[facility] Discharge Instructions for Patients Receiving Chemotherapy  Today you received the following chemotherapy agents: Daratumumab (Darzalex)  To help prevent nausea and vomiting after your treatment, we encourage you to take your nausea medication as directed.   If you develop nausea and vomiting that is not controlled by your nausea medication, call the clinic.   BELOW ARE SYMPTOMS THAT SHOULD BE REPORTED IMMEDIATELY:  *FEVER GREATER THAN 100.5 F  *CHILLS WITH OR WITHOUT FEVER  NAUSEA AND VOMITING THAT IS NOT CONTROLLED WITH YOUR NAUSEA MEDICATION  *UNUSUAL SHORTNESS OF BREATH  *UNUSUAL BRUISING OR BLEEDING  TENDERNESS IN MOUTH AND THROAT WITH OR WITHOUT PRESENCE OF ULCERS  *URINARY PROBLEMS  *BOWEL PROBLEMS  UNUSUAL RASH Items with * indicate a potential emergency and should be followed up as soon as possible.  Feel free to call the clinic should you have any questions or concerns. The clinic phone number is [phone].  Please show the CHEMO ALERT CARD at check-in to the Emergency Department and triage nurse.  Coronavirus (COVID-19) Are you at risk?  Are you at risk for the Coronavirus (COVID-19)?  To be considered HIGH RISK for Coronavirus (COVID-19), you have to meet the following criteria:  . Traveled to [place], [place], [place], [place] or [place]; or in the [place] to [place], [place], [place], or [place]; and have fever, cough, and shortness of breath within the last 2 weeks of travel OR . Been in close contact with a person diagnosed with COVID-19 within the last 2 weeks and have fever, cough, and shortness of breath . IF YOU DO NOT MEET THESE CRITERIA, YOU ARE CONSIDERED LOW RISK FOR COVID-19.  What to do if you are HIGH RISK for COVID-19?  . If you are having a medical emergency, call 911. . Seek medical care right away. Before you go to a doctor's office, urgent care or emergency department, call ahead and tell them  about your recent travel, contact with someone diagnosed with COVID-19, and your symptoms. You should receive instructions from your physician's office regarding next steps of care.  . When you arrive at healthcare provider, tell the healthcare staff immediately you have returned from visiting [place], [place], [place], [place] or [place]; or traveled in the [place] to [place], [place], [place], or [place]; in the last two weeks or you have been in close contact with a person diagnosed with COVID-19 in the last 2 weeks.   . Tell the health care staff about your symptoms: fever, cough and shortness of breath. . After you have been seen by a medical provider, you will be either: o Tested for (COVID-19) and discharged home on quarantine except to seek medical care if symptoms worsen, and asked to  - Stay home and avoid contact with others until you get your results (4-5 days)  - Avoid travel on public transportation if possible (such as bus, train, or airplane) or o Sent to the Emergency Department by EMS for evaluation, COVID-19 testing, and possible admission depending on your condition and test results.  What to do if you are LOW RISK for COVID-19?  Reduce your risk of any infection by using the same precautions used for avoiding the common cold or flu:  . Wash your hands often with soap and warm water for at least 20 seconds.  If soap and water are not readily available, use an alcohol-based hand sanitizer with at least 60% alcohol.  . If coughing or   cover your mouth and nose by coughing or sneezing into the elbow areas of your shirt or coat, into a tissue or into your sleeve (not your hands). . Avoid shaking hands with others and consider head nods or verbal greetings only. . Avoid touching your eyes, nose, or mouth with unwashed hands.  . Avoid close contact with people who are sick. . Avoid places or events with large numbers of people in one location, like concerts or  sporting events. . Carefully consider travel plans you have or are making. . If you are planning any travel outside or inside the [place], visit the CDC's Travelers' Health webpage for the latest health notices. . If you have some symptoms but not all symptoms, continue to monitor at home and seek medical attention if your symptoms worsen. . If you are having a medical emergency, call 911.   [place] / e-Visit: [email or web address]         [facility]: [facility]: [phone]                   [facility]: [phone]

## 2019-02-23 ENCOUNTER — Telehealth: Payer: Self-pay | Admitting: Hematology

## 2019-02-23 NOTE — Telephone Encounter (Signed)
Scheduled appt per 8/10 los. ° °Will contact patient tomorrow. (8/12) °

## 2019-02-24 ENCOUNTER — Telehealth: Payer: Self-pay | Admitting: Hematology

## 2019-02-24 NOTE — Telephone Encounter (Signed)
Called patient to inform her of her appt date and time.  Left a voice message of appt date and time.

## 2019-02-26 ENCOUNTER — Other Ambulatory Visit: Payer: Self-pay | Admitting: *Deleted

## 2019-02-26 ENCOUNTER — Telehealth: Payer: Self-pay

## 2019-02-26 NOTE — Telephone Encounter (Signed)
Patient calls stating that their daughter who lives in [place] has a 4th grade child who they need help with due to remote learning.  They are considering going up there to help them out.  She is wanting to know if there is any way she could get her infusions at [facility] or another facility in [place].  [phone]

## 2019-02-26 NOTE — Patient Outreach (Signed)
[facility]) Care Management  02/26/2019  Shannon Rush Aug 27, 1940 [id number]   RN Health Coach telephone screening call to member.  [employer] compliance verified. [place] described services that [facility] has to offer.  Pharmacy can help you better understand your medicine and or/ assist with resources to get medicines if you need it. Social Worker can help you with community resources. RN Health Coach can help you maintain your health through education on any health conditions and help with any concerns.  Member stated that she did not need any of the services at this time. Patient has agreed to the mailing of brochure if services needed later.  Plan: Member assessed and no further intervention needed at this time Outreach letter and Pamphlet sent   [place] [facility] [phone]

## 2019-03-04 ENCOUNTER — Telehealth: Payer: Self-pay | Admitting: Hematology

## 2019-03-04 DIAGNOSIS — C9000 Multiple myeloma not having achieved remission: Secondary | ICD-10-CM | POA: Diagnosis not present

## 2019-03-04 DIAGNOSIS — M23307 Other meniscus derangements, unspecified meniscus, left knee: Secondary | ICD-10-CM | POA: Diagnosis not present

## 2019-03-04 NOTE — Telephone Encounter (Signed)
[place] 9/9 moved appointments from [place] to [facility]. Left message for patient. Patient to get updated schedule at next visit 8/24.

## 2019-03-05 NOTE — Progress Notes (Signed)
°  Radiation Oncology         [phone]) [phone] ________________________________  Name: Shannon Rush MRN: [id number]  Date: 01/27/2019  DOB: 1940-07-21  End of Treatment Note  Diagnosis:   78 yo woman with skull base myeloma lesion causing left ear pain     Indication for treatment:  Palliative       Radiation treatment dates:   01/14/2019 - 01/27/2019  Site/dose:   Skull Base / 20 Gy in 10 fractions  Beams/energy:   3D, photons / 10X, 6X  Narrative: The patient tolerated radiation treatment relatively well. She continued to report having a mild constant headache, pressure in her ears, and mild occasional nausea. These symptoms persisted from before the start of radiation through the end of treatments.  Plan: The patient has completed radiation treatment. The patient will return to radiation oncology clinic for routine followup in one month. I advised her to call or return sooner if she has any questions or concerns related to her recovery or treatment. ________________________________  Sheral Apley. Tammi Klippel, M.D.   This document serves as a record of services personally performed by Tyler Pita, MD. It was created on his behalf by Wilburn Mylar, a trained medical scribe. The creation of this record is based on the scribe's personal observations and the provider's statements to them. This document has been checked and approved by the attending provider.

## 2019-03-08 ENCOUNTER — Inpatient Hospital Stay (HOSPITAL_BASED_OUTPATIENT_CLINIC_OR_DEPARTMENT_OTHER): Payer: Medicare Other | Admitting: Medical

## 2019-03-08 ENCOUNTER — Inpatient Hospital Stay: Payer: Medicare Other

## 2019-03-08 ENCOUNTER — Other Ambulatory Visit: Payer: Self-pay

## 2019-03-08 VITALS — BP 118/61 | HR 68 | Temp 97.8°F | Resp 17

## 2019-03-08 DIAGNOSIS — I2510 Atherosclerotic heart disease of native coronary artery without angina pectoris: Secondary | ICD-10-CM | POA: Diagnosis not present

## 2019-03-08 DIAGNOSIS — Z7982 Long term (current) use of aspirin: Secondary | ICD-10-CM | POA: Diagnosis not present

## 2019-03-08 DIAGNOSIS — M810 Age-related osteoporosis without current pathological fracture: Secondary | ICD-10-CM | POA: Diagnosis not present

## 2019-03-08 DIAGNOSIS — C9001 Multiple myeloma in remission: Secondary | ICD-10-CM

## 2019-03-08 DIAGNOSIS — C9000 Multiple myeloma not having achieved remission: Secondary | ICD-10-CM

## 2019-03-08 DIAGNOSIS — Z923 Personal history of irradiation: Secondary | ICD-10-CM | POA: Diagnosis not present

## 2019-03-08 DIAGNOSIS — Z79899 Other long term (current) drug therapy: Secondary | ICD-10-CM | POA: Diagnosis not present

## 2019-03-08 DIAGNOSIS — E785 Hyperlipidemia, unspecified: Secondary | ICD-10-CM | POA: Diagnosis not present

## 2019-03-08 DIAGNOSIS — Z5112 Encounter for antineoplastic immunotherapy: Secondary | ICD-10-CM | POA: Diagnosis not present

## 2019-03-08 DIAGNOSIS — Z9221 Personal history of antineoplastic chemotherapy: Secondary | ICD-10-CM | POA: Diagnosis not present

## 2019-03-08 DIAGNOSIS — I252 Old myocardial infarction: Secondary | ICD-10-CM | POA: Diagnosis not present

## 2019-03-08 LAB — CBC WITH DIFFERENTIAL (CANCER CENTER ONLY)
Abs Immature Granulocytes: 0.02 10*3/uL (ref 0.00–0.07)
Basophils Absolute: 0.1 10*3/uL (ref 0.0–0.1)
Basophils Relative: 1 %
Eosinophils Absolute: 0.2 10*3/uL (ref 0.0–0.5)
Eosinophils Relative: 4 %
HCT: 32.7 % — ABNORMAL LOW (ref 36.0–46.0)
Hemoglobin: 11.1 g/dL — ABNORMAL LOW (ref 12.0–15.0)
Immature Granulocytes: 0 %
Lymphocytes Relative: 12 %
Lymphs Abs: 0.6 10*3/uL — ABNORMAL LOW (ref 0.7–4.0)
MCH: 35.7 pg — ABNORMAL HIGH (ref 26.0–34.0)
MCHC: 33.9 g/dL (ref 30.0–36.0)
MCV: 105.1 fL — ABNORMAL HIGH (ref 80.0–100.0)
Monocytes Absolute: 0.4 10*3/uL (ref 0.1–1.0)
Monocytes Relative: 8 %
Neutro Abs: 3.9 10*3/uL (ref 1.7–7.7)
Neutrophils Relative %: 75 %
Platelet Count: 170 10*3/uL (ref 150–400)
RBC: 3.11 MIL/uL — ABNORMAL LOW (ref 3.87–5.11)
RDW: 14.6 % (ref 11.5–15.5)
WBC Count: 5.2 10*3/uL (ref 4.0–10.5)
nRBC: 0 % (ref 0.0–0.2)

## 2019-03-08 LAB — CMP (CANCER CENTER ONLY)
ALT: 17 U/L (ref 0–44)
AST: 29 U/L (ref 15–41)
Albumin: 4.1 g/dL (ref 3.5–5.0)
Alkaline Phosphatase: 58 U/L (ref 38–126)
Anion gap: 10 (ref 5–15)
BUN: 18 mg/dL (ref 8–23)
CO2: 23 mmol/L (ref 22–32)
Calcium: 9.4 mg/dL (ref 8.9–10.3)
Chloride: 106 mmol/L (ref 98–111)
Creatinine: 0.91 mg/dL (ref 0.44–1.00)
GFR, Est AFR Am: >60 mL/min (ref >60)
GFR, Estimated: >60 mL/min (ref >60)
Glucose, Bld: 90 mg/dL (ref 70–99)
Potassium: 4.2 mmol/L (ref 3.5–5.1)
Sodium: 139 mmol/L (ref 135–145)
Total Bilirubin: 0.8 mg/dL (ref 0.3–1.2)
Total Protein: 6.6 g/dL (ref 6.5–8.1)

## 2019-03-08 MED ORDER — ACETAMINOPHEN 325 MG PO TABS
ORAL_TABLET | ORAL | Status: AC
  Filled 2019-03-08: qty 2

## 2019-03-08 MED ORDER — METHYLPREDNISOLONE SODIUM SUCC 125 MG IJ SOLR
125.0000 mg | Freq: Once | INTRAMUSCULAR | Status: AC
  Administered 2019-03-08: 125 mg via INTRAVENOUS

## 2019-03-08 MED ORDER — SODIUM CHLORIDE 0.9 % IV SOLN
16.0000 mg/kg | Freq: Once | INTRAVENOUS | Status: AC
  Administered 2019-03-08: 900 mg via INTRAVENOUS
  Filled 2019-03-08: qty 40

## 2019-03-08 MED ORDER — DIPHENHYDRAMINE HCL 25 MG PO CAPS
ORAL_CAPSULE | ORAL | Status: AC
  Filled 2019-03-08: qty 2

## 2019-03-08 MED ORDER — ACETAMINOPHEN 325 MG PO TABS
650.0000 mg | ORAL_TABLET | Freq: Once | ORAL | Status: AC
  Administered 2019-03-08: 650 mg via ORAL

## 2019-03-08 MED ORDER — DIPHENHYDRAMINE HCL 25 MG PO CAPS
50.0000 mg | ORAL_CAPSULE | Freq: Once | ORAL | Status: AC
  Administered 2019-03-08: 50 mg via ORAL

## 2019-03-08 MED ORDER — MONTELUKAST SODIUM 10 MG PO TABS
ORAL_TABLET | ORAL | Status: AC
  Filled 2019-03-08: qty 1

## 2019-03-08 MED ORDER — SODIUM CHLORIDE 0.9 % IV SOLN
Freq: Once | INTRAVENOUS | Status: AC
  Administered 2019-03-08: 14:00:00 via INTRAVENOUS
  Filled 2019-03-08: qty 250

## 2019-03-08 MED ORDER — MONTELUKAST SODIUM 10 MG PO TABS
10.0000 mg | ORAL_TABLET | Freq: Once | ORAL | Status: AC
  Administered 2019-03-08: 10 mg via ORAL

## 2019-03-08 MED ORDER — METHYLPREDNISOLONE SODIUM SUCC 125 MG IJ SOLR
INTRAMUSCULAR | Status: AC
  Filled 2019-03-08: qty 2

## 2019-03-08 NOTE — Patient Instructions (Signed)
[facility] Discharge Instructions for Patients Receiving Chemotherapy  Today you received the following chemotherapy agents: Daratumumab (Darzalex)  To help prevent nausea and vomiting after your treatment, we encourage you to take your nausea medication as directed.   If you develop nausea and vomiting that is not controlled by your nausea medication, call the clinic.   BELOW ARE SYMPTOMS THAT SHOULD BE REPORTED IMMEDIATELY:  *FEVER GREATER THAN 100.5 F  *CHILLS WITH OR WITHOUT FEVER  NAUSEA AND VOMITING THAT IS NOT CONTROLLED WITH YOUR NAUSEA MEDICATION  *UNUSUAL SHORTNESS OF BREATH  *UNUSUAL BRUISING OR BLEEDING  TENDERNESS IN MOUTH AND THROAT WITH OR WITHOUT PRESENCE OF ULCERS  *URINARY PROBLEMS  *BOWEL PROBLEMS  UNUSUAL RASH Items with * indicate a potential emergency and should be followed up as soon as possible.  Feel free to call the clinic should you have any questions or concerns. The clinic phone number is [phone].  Please show the CHEMO ALERT CARD at check-in to the Emergency Department and triage nurse.  Coronavirus (COVID-19) Are you at risk?  Are you at risk for the Coronavirus (COVID-19)?  To be considered HIGH RISK for Coronavirus (COVID-19), you have to meet the following criteria:  . Traveled to [place], [place], [place], [place] or [place]; or in the [place] to [place], [place], [place], or [place]; and have fever, cough, and shortness of breath within the last 2 weeks of travel OR . Been in close contact with a person diagnosed with COVID-19 within the last 2 weeks and have fever, cough, and shortness of breath . IF YOU DO NOT MEET THESE CRITERIA, YOU ARE CONSIDERED LOW RISK FOR COVID-19.  What to do if you are HIGH RISK for COVID-19?  . If you are having a medical emergency, call 911. . Seek medical care right away. Before you go to a doctor's office, urgent care or emergency department, call ahead and tell them  about your recent travel, contact with someone diagnosed with COVID-19, and your symptoms. You should receive instructions from your physician's office regarding next steps of care.  . When you arrive at healthcare provider, tell the healthcare staff immediately you have returned from visiting [place], [place], [place], [place] or [place]; or traveled in the [place] to [place], [place], [place], or [place]; in the last two weeks or you have been in close contact with a person diagnosed with COVID-19 in the last 2 weeks.   . Tell the health care staff about your symptoms: fever, cough and shortness of breath. . After you have been seen by a medical provider, you will be either: o Tested for (COVID-19) and discharged home on quarantine except to seek medical care if symptoms worsen, and asked to  - Stay home and avoid contact with others until you get your results (4-5 days)  - Avoid travel on public transportation if possible (such as bus, train, or airplane) or o Sent to the Emergency Department by EMS for evaluation, COVID-19 testing, and possible admission depending on your condition and test results.  What to do if you are LOW RISK for COVID-19?  Reduce your risk of any infection by using the same precautions used for avoiding the common cold or flu:  . Wash your hands often with soap and warm water for at least 20 seconds.  If soap and water are not readily available, use an alcohol-based hand sanitizer with at least 60% alcohol.  . If coughing or   cover your mouth and nose by coughing or sneezing into the elbow areas of your shirt or coat, into a tissue or into your sleeve (not your hands). . Avoid shaking hands with others and consider head nods or verbal greetings only. . Avoid touching your eyes, nose, or mouth with unwashed hands.  . Avoid close contact with people who are sick. . Avoid places or events with large numbers of people in one location, like concerts or  sporting events. . Carefully consider travel plans you have or are making. . If you are planning any travel outside or inside the [place], visit the CDC's Travelers' Health webpage for the latest health notices. . If you have some symptoms but not all symptoms, continue to monitor at home and seek medical attention if your symptoms worsen. . If you are having a medical emergency, call 911.   [place] / e-Visit: [email or web address]         [facility]: [facility]: [phone]                   [facility]: [phone]

## 2019-03-08 NOTE — Progress Notes (Signed)
Called back to assess a wound on the left posterior calf. The wound appears to be healing nicely with no exudate, increased warmth, or edema. Continue to monitor and use triple antibiotic ointment.  Sandi Mealy, MHS, PA-C Physician Assistant

## 2019-03-09 ENCOUNTER — Other Ambulatory Visit: Payer: Self-pay | Admitting: Hematology

## 2019-03-09 ENCOUNTER — Telehealth: Payer: Self-pay | Admitting: *Deleted

## 2019-03-09 ENCOUNTER — Telehealth: Payer: Self-pay | Admitting: Hematology

## 2019-03-09 ENCOUNTER — Telehealth: Payer: Self-pay

## 2019-03-09 DIAGNOSIS — C9000 Multiple myeloma not having achieved remission: Secondary | ICD-10-CM

## 2019-03-09 LAB — PROTEIN ELECTROPHORESIS, SERUM
A/G Ratio: 1.7 (ref 0.7–1.7)
Albumin ELP: 3.8 g/dL (ref 2.9–4.4)
Alpha-1-Globulin: 0.2 g/dL (ref 0.0–0.4)
Alpha-2-Globulin: 0.7 g/dL (ref 0.4–1.0)
Beta Globulin: 0.7 g/dL (ref 0.7–1.3)
Gamma Globulin: 0.6 g/dL (ref 0.4–1.8)
Globulin, Total: 2.3 g/dL (ref 2.2–3.9)
M-Spike, %: 0.3 g/dL — ABNORMAL HIGH
Total Protein ELP: 6.1 g/dL (ref 6.0–8.5)

## 2019-03-09 LAB — KAPPA/LAMBDA LIGHT CHAINS
Kappa free light chain: 31.6 mg/L — ABNORMAL HIGH (ref 3.3–19.4)
Kappa, lambda light chain ratio: 10.53 — ABNORMAL HIGH (ref 0.26–1.65)
Lambda free light chains: 3 mg/L — ABNORMAL LOW (ref 5.7–26.3)

## 2019-03-09 NOTE — Telephone Encounter (Signed)
Patient called on 03/08/2019 at 1009 stating she saw Dr. Dimas Millin last week and she knows she was going to speak with Dr. Burr Medico.  She would like information about what is going on moving forward.  She is requesting Dr. Burr Medico call her at [phone]

## 2019-03-09 NOTE — Telephone Encounter (Signed)
I called pt back. I spoke with Dr. [name] a few days ago after she saw her. Dr. [name] recommends restaging with PET and bone marrow biopsy to see if she needs to change her treatment plan. I agree with it. However pt is going to [place] for a month or longer to help her grandchildren with online school. She is leaving this Saturday, and my office has referred her to hem/onc at [facility]. Given her overall stable M-protein level, I recommend her to do PET and bone marrow biopsy when she returns from [place] in a month, or do it at [facility] if she will stay there longer. She is agreeable. I ordered PET to get her insurance approval first.   She will call me when she knows the date of her return.   [name]  03/09/2019

## 2019-03-09 NOTE — Telephone Encounter (Signed)
Records faxed to [facility] - Release [id number]

## 2019-03-11 LAB — IFE, DARA-SPECIFIC, SERUM
IgA: 8 mg/dL — ABNORMAL LOW (ref 64–422)
IgG (Immunoglobin G), Serum: 810 mg/dL (ref 586–1602)
IgM (Immunoglobulin M), Srm: 14 mg/dL — ABNORMAL LOW (ref 26–217)

## 2019-03-12 DIAGNOSIS — H4423 Degenerative myopia, bilateral: Secondary | ICD-10-CM | POA: Diagnosis not present

## 2019-03-12 DIAGNOSIS — H0102A Squamous blepharitis right eye, upper and lower eyelids: Secondary | ICD-10-CM | POA: Diagnosis not present

## 2019-03-12 DIAGNOSIS — Z961 Presence of intraocular lens: Secondary | ICD-10-CM | POA: Diagnosis not present

## 2019-03-12 DIAGNOSIS — H0102B Squamous blepharitis left eye, upper and lower eyelids: Secondary | ICD-10-CM | POA: Diagnosis not present

## 2019-03-19 ENCOUNTER — Telehealth: Payer: Self-pay

## 2019-03-19 NOTE — Telephone Encounter (Signed)
Patient calls stating that they went up to [facility] and found out once up there that their insurance would not pay for her treatments.  They are coming back to [facility] on 9/12 and she wants to schedule treatment the week of 9/14.  A scheduling message has been sent.

## 2019-03-23 ENCOUNTER — Ambulatory Visit: Payer: Medicare Other | Admitting: Physician Assistant

## 2019-03-24 ENCOUNTER — Ambulatory Visit: Payer: Medicare Other | Admitting: Hematology

## 2019-03-24 ENCOUNTER — Ambulatory Visit: Payer: Medicare Other | Admitting: Nurse Practitioner

## 2019-03-24 ENCOUNTER — Ambulatory Visit: Payer: Medicare Other

## 2019-03-24 ENCOUNTER — Telehealth: Payer: Self-pay | Admitting: Hematology

## 2019-03-24 ENCOUNTER — Other Ambulatory Visit: Payer: Medicare Other

## 2019-03-24 NOTE — Telephone Encounter (Signed)
Scheduled appt per 9/4 sch message- unable to reach pt left message with appt date and time

## 2019-03-29 ENCOUNTER — Inpatient Hospital Stay: Payer: Medicare Other | Attending: Hematology

## 2019-03-29 ENCOUNTER — Inpatient Hospital Stay: Payer: Medicare Other | Admitting: Nurse Practitioner

## 2019-03-29 ENCOUNTER — Other Ambulatory Visit: Payer: Self-pay

## 2019-03-29 ENCOUNTER — Telehealth: Payer: Self-pay | Admitting: Nurse Practitioner

## 2019-03-29 ENCOUNTER — Telehealth: Payer: Self-pay | Admitting: Adult Health

## 2019-03-29 ENCOUNTER — Encounter: Payer: Self-pay | Admitting: Nurse Practitioner

## 2019-03-29 ENCOUNTER — Inpatient Hospital Stay: Payer: Medicare Other

## 2019-03-29 VITALS — BP 126/86 | HR 68 | Temp 98.0°F | Resp 18

## 2019-03-29 VITALS — BP 132/83 | HR 94 | Temp 98.2°F | Resp 17 | Ht 66.5 in | Wt 114.2 lb

## 2019-03-29 DIAGNOSIS — M810 Age-related osteoporosis without current pathological fracture: Secondary | ICD-10-CM | POA: Insufficient documentation

## 2019-03-29 DIAGNOSIS — Z923 Personal history of irradiation: Secondary | ICD-10-CM | POA: Diagnosis not present

## 2019-03-29 DIAGNOSIS — Z9481 Bone marrow transplant status: Secondary | ICD-10-CM | POA: Insufficient documentation

## 2019-03-29 DIAGNOSIS — Z9221 Personal history of antineoplastic chemotherapy: Secondary | ICD-10-CM | POA: Insufficient documentation

## 2019-03-29 DIAGNOSIS — D61818 Other pancytopenia: Secondary | ICD-10-CM | POA: Diagnosis not present

## 2019-03-29 DIAGNOSIS — Z7982 Long term (current) use of aspirin: Secondary | ICD-10-CM | POA: Insufficient documentation

## 2019-03-29 DIAGNOSIS — E785 Hyperlipidemia, unspecified: Secondary | ICD-10-CM | POA: Diagnosis not present

## 2019-03-29 DIAGNOSIS — G629 Polyneuropathy, unspecified: Secondary | ICD-10-CM | POA: Diagnosis not present

## 2019-03-29 DIAGNOSIS — Z79899 Other long term (current) drug therapy: Secondary | ICD-10-CM | POA: Insufficient documentation

## 2019-03-29 DIAGNOSIS — Z5112 Encounter for antineoplastic immunotherapy: Secondary | ICD-10-CM | POA: Diagnosis not present

## 2019-03-29 DIAGNOSIS — C9000 Multiple myeloma not having achieved remission: Secondary | ICD-10-CM | POA: Diagnosis not present

## 2019-03-29 DIAGNOSIS — I252 Old myocardial infarction: Secondary | ICD-10-CM | POA: Diagnosis not present

## 2019-03-29 DIAGNOSIS — M899 Disorder of bone, unspecified: Secondary | ICD-10-CM | POA: Diagnosis not present

## 2019-03-29 LAB — CBC WITH DIFFERENTIAL (CANCER CENTER ONLY)
Abs Immature Granulocytes: 0.01 10*3/uL (ref 0.00–0.07)
Basophils Absolute: 0.1 10*3/uL (ref 0.0–0.1)
Basophils Relative: 2 %
Eosinophils Absolute: 0.1 10*3/uL (ref 0.0–0.5)
Eosinophils Relative: 3 %
HCT: 31.9 % — ABNORMAL LOW (ref 36.0–46.0)
Hemoglobin: 10.6 g/dL — ABNORMAL LOW (ref 12.0–15.0)
Immature Granulocytes: 0 %
Lymphocytes Relative: 16 %
Lymphs Abs: 0.6 10*3/uL — ABNORMAL LOW (ref 0.7–4.0)
MCH: 35.3 pg — ABNORMAL HIGH (ref 26.0–34.0)
MCHC: 33.2 g/dL (ref 30.0–36.0)
MCV: 106.3 fL — ABNORMAL HIGH (ref 80.0–100.0)
Monocytes Absolute: 0.4 10*3/uL (ref 0.1–1.0)
Monocytes Relative: 11 %
Neutro Abs: 2.7 10*3/uL (ref 1.7–7.7)
Neutrophils Relative %: 68 %
Platelet Count: 187 10*3/uL (ref 150–400)
RBC: 3 MIL/uL — ABNORMAL LOW (ref 3.87–5.11)
RDW: 14 % (ref 11.5–15.5)
WBC Count: 4 10*3/uL (ref 4.0–10.5)
nRBC: 0 % (ref 0.0–0.2)

## 2019-03-29 LAB — CMP (CANCER CENTER ONLY)
ALT: 9 U/L (ref 0–44)
AST: 17 U/L (ref 15–41)
Albumin: 3.9 g/dL (ref 3.5–5.0)
Alkaline Phosphatase: 48 U/L (ref 38–126)
Anion gap: 8 (ref 5–15)
BUN: 16 mg/dL (ref 8–23)
CO2: 24 mmol/L (ref 22–32)
Calcium: 9 mg/dL (ref 8.9–10.3)
Chloride: 106 mmol/L (ref 98–111)
Creatinine: 0.92 mg/dL (ref 0.44–1.00)
GFR, Est AFR Am: >60 mL/min (ref >60)
GFR, Estimated: >60 mL/min (ref >60)
Glucose, Bld: 107 mg/dL — ABNORMAL HIGH (ref 70–99)
Potassium: 3.9 mmol/L (ref 3.5–5.1)
Sodium: 138 mmol/L (ref 135–145)
Total Bilirubin: 0.7 mg/dL (ref 0.3–1.2)
Total Protein: 6.1 g/dL — ABNORMAL LOW (ref 6.5–8.1)

## 2019-03-29 MED ORDER — ACETAMINOPHEN 325 MG PO TABS
ORAL_TABLET | ORAL | Status: AC
  Filled 2019-03-29: qty 2

## 2019-03-29 MED ORDER — SODIUM CHLORIDE 0.9 % IV SOLN
Freq: Once | INTRAVENOUS | Status: AC
  Administered 2019-03-29: 15:00:00 via INTRAVENOUS
  Filled 2019-03-29: qty 250

## 2019-03-29 MED ORDER — MONTELUKAST SODIUM 10 MG PO TABS
10.0000 mg | ORAL_TABLET | Freq: Once | ORAL | Status: AC
  Administered 2019-03-29: 10 mg via ORAL

## 2019-03-29 MED ORDER — DIPHENHYDRAMINE HCL 25 MG PO CAPS
50.0000 mg | ORAL_CAPSULE | Freq: Once | ORAL | Status: AC
  Administered 2019-03-29: 50 mg via ORAL

## 2019-03-29 MED ORDER — DOXYCYCLINE HYCLATE 100 MG PO TABS: 100.0000 mg | ORAL_TABLET | Freq: Two times a day (BID) | ORAL | 0 refills | Status: DC

## 2019-03-29 MED ORDER — MONTELUKAST SODIUM 10 MG PO TABS
ORAL_TABLET | ORAL | Status: AC
  Filled 2019-03-29: qty 1

## 2019-03-29 MED ORDER — METHYLPREDNISOLONE SODIUM SUCC 125 MG IJ SOLR
125.0000 mg | Freq: Once | INTRAMUSCULAR | Status: AC
  Administered 2019-03-29: 125 mg via INTRAVENOUS

## 2019-03-29 MED ORDER — METHYLPREDNISOLONE SODIUM SUCC 125 MG IJ SOLR
INTRAMUSCULAR | Status: AC
  Filled 2019-03-29: qty 2

## 2019-03-29 MED ORDER — DIPHENHYDRAMINE HCL 25 MG PO CAPS
ORAL_CAPSULE | ORAL | Status: AC
  Filled 2019-03-29: qty 2

## 2019-03-29 MED ORDER — SODIUM CHLORIDE 0.9 % IV SOLN
16.0000 mg/kg | Freq: Once | INTRAVENOUS | Status: AC
  Administered 2019-03-29: 900 mg via INTRAVENOUS
  Filled 2019-03-29: qty 40

## 2019-03-29 MED ORDER — ACETAMINOPHEN 325 MG PO TABS
650.0000 mg | ORAL_TABLET | Freq: Once | ORAL | Status: AC
  Administered 2019-03-29: 650 mg via ORAL

## 2019-03-29 NOTE — Telephone Encounter (Signed)
Called and spoke with Shannon Rush in flow cytometry regarding bone marrow biopsy on 9/17.

## 2019-03-29 NOTE — Telephone Encounter (Signed)
Scheduled appt per 9/14 los.  Sent a secure chat to get her appts taken to her while she in treatment.

## 2019-03-29 NOTE — Progress Notes (Signed)
[place]   [phone] Fax:([phone]   Clinic Follow up Note   Patient Care Team: [name], MD as PCP - General (Family Medicine) [name], MD as Attending Physician (Obstetrics and Gynecology) [name], MD as Consulting Physician (Internal Medicine) [name], MD as Consulting Physician (Hematology) [name], MD as Consulting Physician (Ophthalmology) [name], MD as Consulting Physician (Cardiology) [name], MD as Consulting Physician (Dermatology) [name] (Dentistry) [name], DPM as Consulting Physician (Podiatry) Pleasant, Eppie Gibson, RN as Central Management 03/29/2019  CHIEF COMPLAINT: F/u Multiple myeloma   SUMMARY OF ONCOLOGIC HISTORY: Oncology History Overview Note  Multiple myeloma ([place])   Staging form: Multiple Myeloma, AJCC 6th Edition   - Clinical: Stage IIIA - Signed by [name], MD on 09/04/2013    Multiple myeloma ([place])  05/26/2012 Initial Diagnosis   Presenting IgG was 4,040 mg/dL on 06/05/2012 (IgA 35; IgM 34); kappa free light chain 34.4 mg/dL, lambda 0.00, kappa:lambda ratio of 34.75; SPEP with M-spike of 2.60.   06/30/2012 Imaging   Numerous lytic lesions throughout the calvarium.  Lytic lesion within the left lateral scapula.  Findings most compatible with metastases or myeloma. Multiple mid and lower thoracic compression fractures.  Slight compression through the endplates at L3.   50/03/3817 Bone Marrow Biopsy   Bone marrow biopsy showed 49% plasma cell. Normal classical cytogenetics; however, myeloma FISH panel showed 13q- (intermediate risk)       07/14/2012 - 07/27/2012 Radiation Therapy   Palliative radiation 20 Gy over 10 fractions between to thoracic spine cord compression and symptomatic left scapula lytic lesion.   08/10/2012 - 11/2012 Chemotherapy   Started SQ Velcade once weekly, 3 weeks on, 1 weeks off; daily  Revlimid d1-21, 7 days off; and Dexamethasone 69m PO weekly.    02/12/2013 Bone Marrow Transplant   [facility]    06/14/2013 Tumor Marker   IgG  1830,  Kappa:lamba ratio, 1.69 (baseline was 34.75 or   M-spike 0.28 (baseline of 2.6 or  89.3% of baseline).     06/25/2013 -  Chemotherapy   Started zometa 3.5 mg monthly    09/01/2013 - 01/2014 Chemotherapy   Maintenance therapy with revlimid 551mdaily for 14 days and then off for 7 (decreased from 21 days on and 7 days off based on neutropenia). Stopped due to disease progression 01/2017   09/13/2013 - 09/17/2014 Hospital Admission   Hospital admission for pneumonia   12/20/2013 Treatment Plan Change   Maintenance therapy decreased to 2.5 mg daily for 21 days and then off for 7 days based on low counts.    01/25/2014 Tumor Marker   IgG 1360 M spike 0.5   03/01/2014 - 03/05/2014 Hospital [facility], received thrombolytic therapy, cath showed 60% prox RCA, mid-distal RCA 50%, Echo normal, no stents.   04/11/2014 - 08/07/2014 Chemotherapy   Continue Revlimid at 2.5 mg 3 weeks on 1 week off   08/08/2014 - 08/13/2014 Hospital Admission   Hospital admission for pneumonia. Her Revlimid was held.   08/29/2014 - 09/14/2014 Chemotherapy   Maintenance Revlimid restarted   09/14/2014 - 09/17/2014 Hospital Admission   [place].   11/13/2014 - 12/25/2015 Chemotherapy   Maintenance Revlimid restarted, changed to 2.14m32maily, 2 weks on, 1 week off from 12/15/2014, stopped due  to disease progression    01/24/2016 Progression   Patient is M protein has gradually increased to 1.0g, repeated a bone marrow biopsy showed plasma cell 10-30%   02/27/2016 - 03/07/2016 Chemotherapy   Pomalidomide 4 mg daily on day 1-21, dexamethasone 40 mg on day 1, 8 and 15, every 28 days, started on 02/27/2016, daratumumab  weekly started on 03/07/2016, held after first dose, due to hospitalization and a severe pancytopenia, pomalidomide was stopped afterwards    03/10/2016 - 03/29/2016 Hospital Admission   Pt was admitted for sepsis from pneumonia, and severe pancytopenia. She required ICU stay for a few days due to hypotension, developed b/l pleural effusion required diuretics and b/l thoracentesis. She also required blood and plt transfusion, and prolonged neupogen injection for severe neutropenia. She was discharged home after 3 weeks hospital stay    04/26/2016 -  Chemotherapy   Daratumumab restarted on 04/26/2016, Velcade 1.56m/m2 and dexa 227mweekly start on 11/3, velcade held since 06/28/2016 due to CHF, dexa stopped on12/11/2017 due to high risk of fracture    08/26/2016 Echocardiogram   - Left ventricle: LVEF is approximately 35% with diffuse diffuse   hypokinesis with severe hypokinesis/ akinesis of the   inferior/inferoseptal walls. In comparison to echo images from   December 2017, there does not appear to be significant change The   cavity size was normal. Wall thickness was normal. Doppler   parameters are consistent with abnormal left ventricular   relaxation (grade 1 diastolic dysfunction). - Aortic valve: AV is thickened. 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 Present in echo from December 2017 There was   trivial regurgitation. - Right ventricle: Systolic function was mildly reduced. - Right atrium: The atrium was mildly dilated. - Pericardium, extracardiac: A trivial pericardial effusion was   identified.   12/03/2016 Echocardiogram   -Left Ventricle: Systolic function was   mildly to moderately reduced. The estimated ejection fraction was   in the range of 40% to 45%. Diffuse hypokinesis. Doppler   parameters are consistent with abnormal left ventricular   relaxation (grade 1 diastolic dysfunction). Doppler parameters   are  consistent with indeterminate ventricular filling pressure. - Left atrium: The atrium was severely dilated. - Tricuspid valve: There was trivial regurgitation. - Pulmonary arteries: Systolic pressure was within the normal   range. PA peak pressure: 33 mm Hg (S). - Global longitudinal strain -10.5% (abnormal)   03/12/2017 Echocardiogram   ECHO 03/12/17 Study Conclusions - Left ventricle: The cavity size was normal. Wall thickness was   increased in a pattern of mild LVH. Systolic function was normal.   The estimated ejection fraction was in the range of 50% to 55%.   There is hypokinesis of the basalinferior myocardium. Doppler   parameters are consistent with abnormal left ventricular   relaxation (grade 1 diastolic dysfunction). - Aortic valve: There was trivial regurgitation. - Mitral valve: Calcified annulus. Mildly thickened leaflets .   There was trivial regurgitation. - Tricuspid valve: There was mild regurgitation. Impressions: - There has been mild improvement in EF since prior study.    12/12/2017 - 12/15/2017 Hospital Admission   Admission diagnosis: Pnuemonia and acute hypoxic respiratory failure Additional comments: treated for pneumoania and acute hypoxic respiratory failure before she was discharged on 12/15/17 with oral anitbiotics. She did not require home oxygen.    12/30/2017 - 01/02/2018 Hospital Admission   Admission diagnosis: Pnuemonia  Additional comments: She was again hospitalized on 12/30/17  for pneumonia, ID work up was otherwise negative, she was discharged home with oral antibiotics.    04/01/2018 Imaging   04/01/2018 CXR IMPRESSION: Persisting airspace disease superimposed on chronic changes, suggesting pneumonia given the history. Followup PA and lateral chest X-ray is recommended in 3-4 weeks following therapy to ensure resolution and exclude underlying malignancy.  Redemonstration of mid and lower thoracic compression fractures with associated  thoracic kyphosis.   04/23/2018 Echocardiogram   04/23/2018 ECHO LV EF: 45% -   50%   01/07/2019 Imaging   MRI Brain 01/07/19 IMPRESSION: MRI HEAD IMPRESSION:   1. 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 Lesion closely approximates the left inner ear structures and involves the left Meckel's cave and left fifth cranial nerve, and most likely is the causative etiology for patient's underlying left ear and facial symptoms. Follow-up examination with postcontrast imaging suggested for complete evaluation. Inclusion of IAC sequences would likely be helpful for visualization. 2. Additional 2.7 cm lesion involving the right occipital calvarium with adjacent marrow signal abnormality, also likely related to multiple myeloma. Metastatic disease would be the primary differential consideration. 3. Underlying age-appropriate cerebral atrophy with mild chronic microvascular ischemic disease. No other acute intracranial abnormality.   MRA HEAD IMPRESSION:   1. Negative intracranial MRA with no large vessel occlusion, hemodynamically significant stenosis, or other acute vascular abnormality. 2. Petrous and cavernous left ICA is partially encased and surrounded by the left petrous apex lesion without significant irregularity or stenosis. 3. 2 mm right paraophthalmic aneurysm.   MRA NECK IMPRESSION:   Normal MRA of the neck with wide patency of the carotid and vertebral arteries bilaterally. No hemodynamically significant stenosis or other acute vascular abnormality identified.    01/14/2019 - 01/27/2019 Radiation Therapy   Radaition to Left petrous apex bone lesion 01/14/19-01/27/19 with Dr. Tammi Klippel.      CURRENT THERAPY: Daratumumabevery 3 weeks. Dexamethasone a day before treatment.Dexa stopped in 07/2018 due to well controlled MM and high risk of  fracture. Dara reduced to every 4 weeks due to COVID-19 starting 10/23/18.Returned to every 3 weeks starting 12/17/18. Restarted every 2 weeks starting 02/22/19 -Target RTto skull base bone lesionwith Dr Tammi Klippel 01/13/19-01/27/19  INTERVAL HISTORY: Ms. Burson returns for f/u and treatment as scheduled. She received another cycle of daratumumab on 03/08/19. She went to [place] to visit family but insurance would not pay for treatments so she returned earlier than planned. She continues to note left calf wound which has some drainage and soreness. The left lower leg has been a bit darker and tender for about one month with no change in it's appearance since then. Has been applying topical antibiotic ointment. Denies fever, chills. She got back from [place] yesterday, wore compression stockings on her legs during travel. Also takes daily aspirin. She has chronic back pain that radiates to left flank, it feels muscular in nature. Denies spasm. Pain is intermittent and occasionally limits activity. Rest helps. Takes tylenol PRN and does not want anything stronger. Her left ear sounds muffled and makes her "tipsy." Similar to symptoms before radiation. Nausea is mild, intermittent; not requiring medication. Energy has declined lately, although she was active in [place] and gardened. She has lost some weight.   MEDICAL HISTORY:  Past Medical History:  Diagnosis Date   CAD (coronary artery disease)    a. inf STEMI (in [place] >>> [facility]) >>> LHC (8/15- Francene [facility]ers  [facility]):  pRCA 60%, mid to dist RCA 50% >>> med rx   Cardiomyopathy [facility])    Cataract    Chronic combined systolic and diastolic CHF (congestive heart failure) (HCC)    Cord compression ([place]) 07/13/12   MRI- diffuse myeloma involvement of T-L spine   History of radiation therapy 07/13/12-07/27/12   spinal cord compression T3-T10,left scapula   Hx of echocardiogram    Echo (03/02/14-done at [facility]):  Normal LVEF without  significant regional wall motion abnormalities. Mild    Multiple myeloma ([place]) 07/01/2012   MVP (mitral valve prolapse)    a. trivial by echo 06/2017.   OSTEOPOROSIS 06/11/2010   Multiple compression fractures; and spontaneous fracture of sternum Qualifier: Diagnosis of  By: [name], Susan      Thoracic kyphosis 07/13/12   per MRI scan   Unspecified deficiency anemia     SURGICAL HISTORY: Past Surgical History:  Procedure Laterality Date   APPENDECTOMY     CATARACT EXTRACTION, BILATERAL     CESAREAN SECTION     x2    COLONOSCOPY  2007   neg with Dr. [name]   ELBOW SURGERY     HIP SURGERY  2009   left   TUBAL LIGATION      I have reviewed the social history and family history with the patient and they are unchanged from previous note.  ALLERGIES:  is allergic to zithromax [azithromycin]; zosyn [piperacillin sod-tazobactam so]; and quinolones.  MEDICATIONS:  Current Outpatient Medications  Medication Sig Dispense Refill   acetaminophen (TYLENOL) 500 MG tablet Take 500 mg by mouth every 6 (six) hours as needed for moderate pain or fever.     acyclovir (ZOVIRAX) 400 MG tablet Take 1 tablet (400 mg total) by mouth 2 (two) times daily. 180 tablet 3   aspirin EC 81 MG tablet Take 1 tablet (81 mg total) by mouth daily. 90 tablet 3   atorvastatin (LIPITOR) 20 MG tablet TAKE 1 TABLET BY MOUTH  DAILY 90 tablet 11   Calcium Carbonate-Vit D-Min (CALCIUM 600+D PLUS MINERALS) 600-400 MG-UNIT TABS Take by mouth.     metoprolol succinate (TOPROL-XL) 25 MG 24 hr tablet      doxycycline (VIBRA-TABS) 100 MG tablet Take 1 tablet (100 mg total) by mouth 2 (two) times daily. 14 tablet 0   No current facility-administered medications for this visit.     PHYSICAL EXAMINATION: ECOG PERFORMANCE STATUS: 1 - Symptomatic but completely ambulatory  Vitals:   03/29/19 1326  BP: 132/83  Pulse: 94  Resp: 17  Temp: 98.2 F (36.8 C)  SpO2: 100%   Filed Weights   03/29/19 1326    Weight: 114 lb 3.2 oz (51.8 kg)    GENERAL:alert, no distress and comfortable SKIN: left posterior calf wound with mild surrounding erythema and edema; left lower leg distal to the wound with mild tenderness and hyperpigmentation  EYES: sclera clear LUNGS: clear to auscultation with normal breathing effort HEART: regular rate & rhythm  ABDOMEN:abdomen flat Musculoskeletal: no focal spinal tenderness  NEURO: alert & oriented x 3 with fluent speech, normal gait   LABORATORY DATA:  I have reviewed the data as listed CBC Latest Ref Rng & Units 03/29/2019 03/08/2019 02/22/2019  WBC 4.0 - 10.5 K/uL 4.0 5.2 3.8(L)  Hemoglobin 12.0 - 15.0 g/dL 10.6(L) 11.1(L) 10.4(L)  Hematocrit 36.0 - 46.0 % 31.9(L) 32.7(L) 31.1(L)  Platelets 150 - 400 K/uL 187 170 169     CMP Latest Ref  Rng & Units 03/29/2019 03/08/2019 02/22/2019  Glucose 70 - 99 mg/dL 107(H) 90 84  BUN 8 - 23 mg/dL _0 Creatinine 0.44 - 1.00 mg/dL 0.92 0.91 0.91  Sodium 135 - 145 mmol/L 138 139 140  Potassium 3.5 - 5.1 mmol/L 3.9 4.2 3.9  Chloride 98 - 111 mmol/L 106 106 107  CO2 22 - 32 mmol/L 24 23 21(L)  Calcium 8.9 - 10.3 mg/dL 9.0 9.4 9.2  Total Protein 6.5 - 8.1 g/dL 6.1(L) 6.6 6.2(L)  Total Bilirubin 0.3 - 1.2 mg/dL 0.7 0.8 0.7  Alkaline Phos 38 - 126 U/L 48 58 54  AST 15 - 41 U/L _1 ALT 0 - 44 U/L _2 RADIOGRAPHIC STUDIES: I have personally reviewed the radiological images as listed and agreed with the findings in the report. No results found.   ASSESSMENT & PLAN: Shannon Rush is a 78 y.o. female with    1. IgG kappa Multiple myeloma s/p Auto SCT, intermediate risk, relapsed in 01/2016 -Diagnosed in 05/2012. Treated with induction chemo, radiation and BM transplant, but unfortunately progressed while on maintenance Revlimid 01/2016. She is currently onDaratumumabmonotherapy   -Dexa was stoppeddue to her high risk of fracture, and well controlled MM -During COVID19 treatment changed to  every 4 weeks, and changed back to every 3 weeks in early June -Due to her slight disease progression and symptomatic bone lesion in left petrous apex, Dr. Burr Medico changed Dara back to q2 weeks.  2. Left petrous apex bone lesion -Her inner ear issues and dizziness progressed and she developedmildbalance issueand nausea  -Her MRI brain from 01/07/19 showedabnormal osseous lesion measuring up to approximately 5.9 cm centered at the left petrous apex. Thismost likely is the cause of her underlyingleft ear and balancesymptoms. -Given she is symptomatic, sheunderwent target RT with Dr Tammi Klippel 7/1-7/15. -She has stable muffled hearing and mild imbalance issue, no significant improvement after RT  3. History of multiple pneumonia andbronchitis -She has had multiple episodes of pneumonia, required hospitalization,and bronchitis, required antibiotics -Her vaccines are up-to-date.  4. Pancytopenia -Secondary to Dara -WBC and PLT normal today, Hgb 10.6 which is stable  5. CHF, CAD, inferior STEMI in 02/2014, HLD -She is on ASA, BB, statin -f/u with Dr. Meda Coffee and continue medications  6. Osteoporosis with multiple fractures -Last DEXA in 2015 showed worsening osteoporosis. She declined repeat DEXA. -She had a fall induced fracture in 01/2018. -She was on Zometa for 4 years, stopped after April 2018  7. Peripheral neuropathy in feet, G1-2 -Secondary to previous chemo. -not mentioned at today's visit   8. Left posterior calf wound -she sustained a wound in early August, is has not healed quickly. She applied topical antibiotic ointment for a few weeks -now with mild swelling, tenderness, and skin hyperpigmentation - will treat with Doxycycline 100 mg BID x1 week. Will monitor her progress  -she knows to monitor for fever, chills, worsening pain or swelling  9. Goal of care discussion  -she is full code now   Disposition: Ms. Begnaud appears stable. She completed another  cycle of Daratumumab on 03/08/19. She tolerates treatment moderately well overall. Despite increasing fatigue and weight loss, she continues to have very good performance status, she remains very active. I encouraged her to increase supplements. Labs reviewed, MM on 03/08/19 stable at 0.3, and kappa light chain decreased; CBC and CMP stable. She will proceed with another cycle of daratumumab today, currently receiving q2  weeks. She will undergo restaging of myeloma with bone marrow biopsy per Wilber Bihari, NP on 9/17 and PET on 04/09/19. She will f/u in 2 weeks to review results.   For back pain and inner ear symptoms including nausea, continue symptom management. PET to evaluate treated skull base lesion and left back site of chronic pain.   She has had a left posterior calf wound that is healing poorly, now with mild swelling, skin hyperpigmentation, and tenderness. Will treat with doxycycline to cover MRSA infection. She knows to notify if symptoms worsen or fail to improve. I will call her in 2 days to evaluate response to medication. I have a low suspicion for DVT, but given recent long road trip, malignancy, and appearance of the leg it is on my differential. She is on aspirin. If swelling and tenderness do not improve promptly with antibiotics will obtain doppler. Patient agrees.   All questions were answered. The patient knows to call the clinic with any problems, questions or concerns. No barriers to learning was detected. I spent 20 minutes counseling the patient face to face. The total time spent in the appointment was 25 minutes and more than 50% was on counseling and review of test results     Alla Feeling, NP 03/29/19

## 2019-03-29 NOTE — Patient Instructions (Signed)
[facility] Discharge Instructions for Patients Receiving Chemotherapy  Today you received the following chemotherapy agents Daratumumab (DARZALEX).  To help prevent nausea and vomiting after your treatment, we encourage you to take your nausea medication as prescribed.  If you develop nausea and vomiting that is not controlled by your nausea medication, call the clinic.   BELOW ARE SYMPTOMS THAT SHOULD BE REPORTED IMMEDIATELY:  *FEVER GREATER THAN 100.5 F  *CHILLS WITH OR WITHOUT FEVER  NAUSEA AND VOMITING THAT IS NOT CONTROLLED WITH YOUR NAUSEA MEDICATION  *UNUSUAL SHORTNESS OF BREATH  *UNUSUAL BRUISING OR BLEEDING  TENDERNESS IN MOUTH AND THROAT WITH OR WITHOUT PRESENCE OF ULCERS  *URINARY PROBLEMS  *BOWEL PROBLEMS  UNUSUAL RASH Items with * indicate a potential emergency and should be followed up as soon as possible.  Feel free to call the clinic should you have any questions or concerns. The clinic phone number is [phone].  Please show the CHEMO ALERT CARD at check-in to the Emergency Department and triage nurse.  Coronavirus (COVID-19) Are you at risk?  Are you at risk for the Coronavirus (COVID-19)?  To be considered HIGH RISK for Coronavirus (COVID-19), you have to meet the following criteria:  . Traveled to [place], [place], [place], [place] or [place]; or in the [place] to [place], [place], [place], or [place]; and have fever, cough, and shortness of breath within the last 2 weeks of travel OR . Been in close contact with a person diagnosed with COVID-19 within the last 2 weeks and have fever, cough, and shortness of breath . IF YOU DO NOT MEET THESE CRITERIA, YOU ARE CONSIDERED LOW RISK FOR COVID-19.  What to do if you are HIGH RISK for COVID-19?  . If you are having a medical emergency, call 911. . Seek medical care right away. Before you go to a doctor's office, urgent care or emergency department, call ahead and tell them  about your recent travel, contact with someone diagnosed with COVID-19, and your symptoms. You should receive instructions from your physician's office regarding next steps of care.  . When you arrive at healthcare provider, tell the healthcare staff immediately you have returned from visiting [place], [place], [place], [place] or [place]; or traveled in the [place] to [place], [place], [place], or [place]; in the last two weeks or you have been in close contact with a person diagnosed with COVID-19 in the last 2 weeks.   . Tell the health care staff about your symptoms: fever, cough and shortness of breath. . After you have been seen by a medical provider, you will be either: o Tested for (COVID-19) and discharged home on quarantine except to seek medical care if symptoms worsen, and asked to  - Stay home and avoid contact with others until you get your results (4-5 days)  - Avoid travel on public transportation if possible (such as bus, train, or airplane) or o Sent to the Emergency Department by EMS for evaluation, COVID-19 testing, and possible admission depending on your condition and test results.  What to do if you are LOW RISK for COVID-19?  Reduce your risk of any infection by using the same precautions used for avoiding the common cold or flu:  . Wash your hands often with soap and warm water for at least 20 seconds.  If soap and water are not readily available, use an alcohol-based hand sanitizer with at least 60% alcohol.  . If coughing or sneezing,   sneezing, cover your mouth and nose by coughing or sneezing into the elbow areas of your shirt or coat, into a tissue or into your sleeve (not your hands). . Avoid shaking hands with others and consider head nods or verbal greetings only. . Avoid touching your eyes, nose, or mouth with unwashed hands.  . Avoid close contact with people who are sick. . Avoid places or events with large numbers of people in one location, like concerts or  sporting events. . Carefully consider travel plans you have or are making. . If you are planning any travel outside or inside the [place], visit the CDC's Travelers' Health webpage for the latest health notices. . If you have some symptoms but not all symptoms, continue to monitor at home and seek medical attention if your symptoms worsen. . If you are having a medical emergency, call 911.   [place] / e-Visit: [email or web address]         [facility]: [facility]: [phone]                   [facility]: [phone]

## 2019-03-30 ENCOUNTER — Telehealth: Payer: Self-pay

## 2019-03-30 NOTE — Telephone Encounter (Signed)
Spoke with patient to confirm that she is taking the Doxycycline for her leg redness/swelling, she confirmed she is.

## 2019-04-01 ENCOUNTER — Inpatient Hospital Stay (HOSPITAL_BASED_OUTPATIENT_CLINIC_OR_DEPARTMENT_OTHER): Payer: Medicare Other | Admitting: Adult Health

## 2019-04-01 ENCOUNTER — Encounter: Payer: Self-pay | Admitting: Adult Health

## 2019-04-01 ENCOUNTER — Inpatient Hospital Stay: Payer: Medicare Other

## 2019-04-01 ENCOUNTER — Other Ambulatory Visit: Payer: Self-pay

## 2019-04-01 ENCOUNTER — Encounter: Payer: Self-pay | Admitting: Nurse Practitioner

## 2019-04-01 ENCOUNTER — Other Ambulatory Visit: Payer: Self-pay | Admitting: Hematology

## 2019-04-01 VITALS — BP 125/77 | HR 95 | Temp 98.5°F | Resp 16

## 2019-04-01 DIAGNOSIS — D539 Nutritional anemia, unspecified: Secondary | ICD-10-CM | POA: Diagnosis not present

## 2019-04-01 DIAGNOSIS — Z9221 Personal history of antineoplastic chemotherapy: Secondary | ICD-10-CM | POA: Diagnosis not present

## 2019-04-01 DIAGNOSIS — D61818 Other pancytopenia: Secondary | ICD-10-CM

## 2019-04-01 DIAGNOSIS — D72819 Decreased white blood cell count, unspecified: Secondary | ICD-10-CM | POA: Diagnosis not present

## 2019-04-01 DIAGNOSIS — I252 Old myocardial infarction: Secondary | ICD-10-CM | POA: Diagnosis not present

## 2019-04-01 DIAGNOSIS — M899 Disorder of bone, unspecified: Secondary | ICD-10-CM | POA: Diagnosis not present

## 2019-04-01 DIAGNOSIS — G629 Polyneuropathy, unspecified: Secondary | ICD-10-CM | POA: Diagnosis not present

## 2019-04-01 DIAGNOSIS — Z923 Personal history of irradiation: Secondary | ICD-10-CM | POA: Diagnosis not present

## 2019-04-01 DIAGNOSIS — C9000 Multiple myeloma not having achieved remission: Secondary | ICD-10-CM | POA: Diagnosis not present

## 2019-04-01 DIAGNOSIS — E785 Hyperlipidemia, unspecified: Secondary | ICD-10-CM | POA: Diagnosis not present

## 2019-04-01 DIAGNOSIS — D47Z9 Other specified neoplasms of uncertain behavior of lymphoid, hematopoietic and related tissue: Secondary | ICD-10-CM | POA: Diagnosis not present

## 2019-04-01 DIAGNOSIS — Z5112 Encounter for antineoplastic immunotherapy: Secondary | ICD-10-CM | POA: Diagnosis not present

## 2019-04-01 DIAGNOSIS — Z7982 Long term (current) use of aspirin: Secondary | ICD-10-CM | POA: Diagnosis not present

## 2019-04-01 DIAGNOSIS — Z9481 Bone marrow transplant status: Secondary | ICD-10-CM | POA: Diagnosis not present

## 2019-04-01 DIAGNOSIS — Z79899 Other long term (current) drug therapy: Secondary | ICD-10-CM | POA: Diagnosis not present

## 2019-04-01 DIAGNOSIS — M810 Age-related osteoporosis without current pathological fracture: Secondary | ICD-10-CM | POA: Diagnosis not present

## 2019-04-01 LAB — CBC WITH DIFFERENTIAL (CANCER CENTER ONLY)
Abs Immature Granulocytes: 0.01 10*3/uL (ref 0.00–0.07)
Basophils Absolute: 0 10*3/uL (ref 0.0–0.1)
Basophils Relative: 1 %
Eosinophils Absolute: 0.2 10*3/uL (ref 0.0–0.5)
Eosinophils Relative: 5 %
HCT: 32.2 % — ABNORMAL LOW (ref 36.0–46.0)
Hemoglobin: 10.8 g/dL — ABNORMAL LOW (ref 12.0–15.0)
Immature Granulocytes: 0 %
Lymphocytes Relative: 14 %
Lymphs Abs: 0.5 10*3/uL — ABNORMAL LOW (ref 0.7–4.0)
MCH: 35.5 pg — ABNORMAL HIGH (ref 26.0–34.0)
MCHC: 33.5 g/dL (ref 30.0–36.0)
MCV: 105.9 fL — ABNORMAL HIGH (ref 80.0–100.0)
Monocytes Absolute: 0.4 10*3/uL (ref 0.1–1.0)
Monocytes Relative: 10 %
Neutro Abs: 2.5 10*3/uL (ref 1.7–7.7)
Neutrophils Relative %: 70 %
Platelet Count: 163 10*3/uL (ref 150–400)
RBC: 3.04 MIL/uL — ABNORMAL LOW (ref 3.87–5.11)
RDW: 14.1 % (ref 11.5–15.5)
WBC Count: 3.6 10*3/uL — ABNORMAL LOW (ref 4.0–10.5)
nRBC: 0 % (ref 0.0–0.2)

## 2019-04-01 MED ORDER — LIDOCAINE HCL 2 % IJ SOLN
INTRAMUSCULAR | Status: AC
  Filled 2019-04-01: qty 20

## 2019-04-01 NOTE — Progress Notes (Signed)
INDICATION: multiple myeloma   Bone Marrow Biopsy and Aspiration Procedure Note   Informed consent was obtained and potential risks including bleeding, infection and pain were reviewed with the patient.  The patient's name, date of birth, identification, consent and allergies were verified prior to the start of procedure and time out was performed.  The left posterior iliac crest was chosen as the site of biopsy.  The skin was prepped with ChloraPrep.   3 cc of 2% lidocaine was used to provide local anaesthesia.   10 cc of bone marrow aspirate was obtained followed by 1cm biopsy.  Pressure was applied to the biopsy site and bandage was placed over the biopsy site. Patient was made to lie on the back for 30 mins prior to discharge.  The procedure was tolerated well. COMPLICATIONS: None BLOOD LOSS: none The patient was discharged home in stable condition with a 1 week follow up to review results.  Patient was provided with post bone marrow biopsy instructions and instructed to call if there was any bleeding or worsening pain.  Specimens sent for flow cytometry, cytogenetics and additional studies.  Signed Scot Dock, NP

## 2019-04-01 NOTE — Progress Notes (Signed)
Bone marrow biopsy/aspiration performed by Wilber Bihari, NP. Patient tolerated procedure well with minimal complaints of mild discomfort during procedure. All resolved at time of procedure completion. Hemostasis at procedure site achieved with direct pressure only and sterile gauze dressing applied. At time of discharge, dressing clean/dry/intact, and patient denied any complaints.

## 2019-04-01 NOTE — Progress Notes (Signed)
I saw Shannon Rush in the infusion room after bone marrow biopsy, she was awake alert and eating. She feels well. Has started doxycycline on 03/30/19, on day 3. Left leg is not worse but not significantly better. Wound appears to be healing, no edema. Only minimal erythema and tenderness. Lower leg remains hyperpigmented more than right, which I suspect is related to the wound vs venous stasis changes. Low suspicion for DVT. Will continue antibiotics. Will check on her progress next week. She appreciates the f/u.  Cira Rue, NP  04/01/19

## 2019-04-01 NOTE — Patient Instructions (Signed)

## 2019-04-02 ENCOUNTER — Other Ambulatory Visit: Payer: Self-pay

## 2019-04-02 ENCOUNTER — Other Ambulatory Visit: Payer: Self-pay | Admitting: Cardiology

## 2019-04-02 DIAGNOSIS — R238 Other skin changes: Secondary | ICD-10-CM | POA: Diagnosis not present

## 2019-04-02 MED ORDER — METOPROLOL SUCCINATE ER 25 MG PO TB24: 25.0000 mg | ORAL_TABLET | Freq: Every day | ORAL | 0 refills | Status: DC

## 2019-04-02 MED ORDER — ATORVASTATIN CALCIUM 20 MG PO TABS: 20.0000 mg | ORAL_TABLET | Freq: Every day | ORAL | 0 refills | Status: DC

## 2019-04-02 NOTE — Telephone Encounter (Signed)
Pt calling requesting a 10 day supply until mail order arrives. Pt's medications were sent to pt's pharmacy as requested. Confirmation received.

## 2019-04-06 ENCOUNTER — Ambulatory Visit: Payer: Medicare Other

## 2019-04-06 ENCOUNTER — Other Ambulatory Visit: Payer: Medicare Other

## 2019-04-06 LAB — SURGICAL PATHOLOGY

## 2019-04-08 NOTE — Progress Notes (Signed)
[place]   Telephone:(336) [phone] Fax:(336) [phone]   Clinic Follow up Note   Patient Care Team: [name], MD as PCP - General (Family Medicine) [name], MD as Attending Physician (Obstetrics and Gynecology) [name], MD as Consulting Physician (Internal Medicine) [name], MD as Consulting Physician (Hematology) [name], MD as Consulting Physician (Ophthalmology) [name], MD as Consulting Physician (Cardiology) [name], MD as Consulting Physician (Dermatology) [name] (Dentistry) [name], DPM as Consulting Physician (Podiatry) Pleasant, Eppie Gibson, RN as [facility]  Date of Service:  04/12/2019  CHIEF COMPLAINT: F/u on MM  SUMMARY OF ONCOLOGIC HISTORY: Oncology History Overview Note  Multiple myeloma [facility])   Staging form: Multiple Myeloma, AJCC 6th Edition   - Clinical: Stage IIIA - Signed by [name], MD on 09/04/2013    Multiple myeloma ([place])  05/26/2012 Initial Diagnosis   Presenting IgG was 4,040 mg/dL on 06/05/2012 (IgA 35; IgM 34); kappa free light chain 34.4 mg/dL, lambda 0.00, kappa:lambda ratio of 34.75; SPEP with M-spike of 2.60.   06/30/2012 Imaging   Numerous lytic lesions throughout the calvarium.  Lytic lesion within the left lateral scapula.  Findings most compatible with metastases or myeloma. Multiple mid and lower thoracic compression fractures.  Slight compression through the endplates at L3.   95/03/3266 Bone Marrow Biopsy   Bone marrow biopsy showed 49% plasma cell. Normal classical cytogenetics; however, myeloma FISH panel showed 13q- (intermediate risk)       07/14/2012 - 07/27/2012 Radiation Therapy   Palliative radiation 20 Gy over 10 fractions between to thoracic spine cord compression and symptomatic left scapula lytic lesion.   08/10/2012 - 11/2012 Chemotherapy   Started SQ Velcade once weekly, 3 weeks on, 1 weeks off;  daily Revlimid d1-21, 7 days off; and Dexamethasone 69m PO weekly.    02/12/2013 Bone Marrow Transplant   [facility]    06/14/2013 Tumor Marker   IgG  1830,  Kappa:lamba ratio, 1.69 (baseline was 34.75 or   M-spike 0.28 (baseline of 2.6 or  89.3% of baseline).     06/25/2013 -  Chemotherapy   Started zometa 3.5 mg monthly    09/01/2013 - 01/2014 Chemotherapy   Maintenance therapy with revlimid 550mdaily for 14 days and then off for 7 (decreased from 21 days on and 7 days off based on neutropenia). Stopped due to disease progression 01/2017   09/13/2013 - 09/17/2014 Hospital Admission   Hospital admission for pneumonia   12/20/2013 Treatment Plan Change   Maintenance therapy decreased to 2.5 mg daily for 21 days and then off for 7 days based on low counts.    01/25/2014 Tumor Marker   IgG 1360 M spike 0.5   03/01/2014 - 03/05/2014 Hospital Admission   [facility], received thrombolytic therapy, cath showed 60% prox RCA, mid-distal RCA 50%, Echo normal, no stents.   04/11/2014 - 08/07/2014 Chemotherapy   Continue Revlimid at 2.5 mg 3 weeks on 1 week off   08/08/2014 - 08/13/2014 Hospital Admission   Hospital admission for pneumonia. Her Revlimid was held.   08/29/2014 - 09/14/2014 Chemotherapy   Maintenance Revlimid restarted   09/14/2014 - 09/17/2014 Hospital Admission   [place].   11/13/2014 - 12/25/2015 Chemotherapy   Maintenance Revlimid restarted, changed to 2.85m80maily, 2 weks on, 1 week off  from 12/15/2014, stopped due to disease progression    01/24/2016 Progression   Patient is M protein has gradually increased to 1.0g, repeated a bone marrow biopsy showed plasma cell 10-30%   02/27/2016 - 03/07/2016 Chemotherapy   Pomalidomide 4 mg daily on day 1-21, dexamethasone 40 mg on day 1, 8 and 15, every 28 days, started on 02/27/2016,  daratumumab weekly started on 03/07/2016, held after first dose, due to hospitalization and a severe pancytopenia, pomalidomide was stopped afterwards    03/10/2016 - 03/29/2016 Hospital Admission   Pt was admitted for sepsis from pneumonia, and severe pancytopenia. She required ICU stay for a few days due to hypotension, developed b/l pleural effusion required diuretics and b/l thoracentesis. She also required blood and plt transfusion, and prolonged neupogen injection for severe neutropenia. She was discharged home after 3 weeks hospital stay    04/26/2016 -  Chemotherapy   Daratumumab restarted on 04/26/2016, Velcade 1.78m/m2 and dexa 280mweekly start on 11/3, velcade held since 06/28/2016 due to CHF, dexa stopped on12/11/2017 due to high risk of fracture.  Dara reduced to every 4 weeks due to COVID-19 starting 10/23/18. Returned to every 3 weeks starting 12/17/18. Restarted every 2 weeks starting 02/22/19   08/26/2016 Echocardiogram   - Left ventricle: LVEF is approximately 35% with diffuse diffuse   hypokinesis with severe hypokinesis/ akinesis of the   inferior/inferoseptal walls. In comparison to echo images from   December 2017, there does not appear to be significant change The   cavity size was normal. Wall thickness was normal. Doppler   parameters are consistent with abnormal left ventricular   relaxation (grade 1 diastolic dysfunction). - Aortic valve: AV is thickened. 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 Present in echo from December 2017 There was   trivial regurgitation. - Right ventricle: Systolic function was mildly reduced. - Right atrium: The atrium was mildly dilated. - Pericardium, extracardiac: A trivial pericardial effusion was   identified.   12/03/2016 Echocardiogram   -Left Ventricle: Systolic function was   mildly to moderately reduced. The estimated ejection fraction was   in the range of 40% to 45%.  Diffuse hypokinesis. Doppler   parameters are consistent with abnormal left ventricular   relaxation (grade 1 diastolic dysfunction). Doppler parameters   are consistent with indeterminate ventricular filling pressure. - Left atrium: The atrium was severely dilated. - Tricuspid valve: There was trivial regurgitation. - Pulmonary arteries: Systolic pressure was within the normal   range. PA peak pressure: 33 mm Hg (S). - Global longitudinal strain -10.5% (abnormal)   03/12/2017 Echocardiogram   ECHO 03/12/17 Study Conclusions - Left ventricle: The cavity size was normal. Wall thickness was   increased in a pattern of mild LVH. Systolic function was normal.   The estimated ejection fraction was in the range of 50% to 55%.   There is hypokinesis of the basalinferior myocardium. Doppler   parameters are consistent with abnormal left ventricular   relaxation (grade 1 diastolic dysfunction). - Aortic valve: There was trivial regurgitation. - Mitral valve: Calcified annulus. Mildly thickened leaflets .   There was trivial regurgitation. - Tricuspid valve: There was mild regurgitation. Impressions: - There has been mild improvement in EF since prior study.    12/12/2017 - 12/15/2017 Hospital Admission   Admission diagnosis: Pnuemonia and acute hypoxic respiratory failure Additional comments: treated for pneumoania and acute hypoxic respiratory failure before she was discharged on 12/15/17 with oral anitbiotics.  She did not require home oxygen.    12/30/2017 - 01/02/2018 Hospital Admission   Admission diagnosis: Pnuemonia  Additional comments: She was again hospitalized on 12/30/17 for pneumonia, ID work up was otherwise negative, she was discharged home with oral antibiotics.    04/01/2018 Imaging   04/01/2018 CXR IMPRESSION: Persisting airspace disease superimposed on chronic changes, suggesting pneumonia given the history. Followup PA and lateral chest X-ray is recommended in 3-4 weeks  following therapy to ensure resolution and exclude underlying malignancy.  Redemonstration of mid and lower thoracic compression fractures with associated thoracic kyphosis.   04/23/2018 Echocardiogram   04/23/2018 ECHO LV EF: 45% -   50%   01/07/2019 Imaging   MRI Brain 01/07/19 IMPRESSION: MRI HEAD IMPRESSION:   1. 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 Lesion closely approximates the left inner ear structures and involves the left Meckel's cave and left fifth cranial nerve, and most likely is the causative etiology for patient's underlying left ear and facial symptoms. Follow-up examination with postcontrast imaging suggested for complete evaluation. Inclusion of IAC sequences would likely be helpful for visualization. 2. Additional 2.7 cm lesion involving the right occipital calvarium with adjacent marrow signal abnormality, also likely related to multiple myeloma. Metastatic disease would be the primary differential consideration. 3. Underlying age-appropriate cerebral atrophy with mild chronic microvascular ischemic disease. No other acute intracranial abnormality.   MRA HEAD IMPRESSION:   1. Negative intracranial MRA with no large vessel occlusion, hemodynamically significant stenosis, or other acute vascular abnormality. 2. Petrous and cavernous left ICA is partially encased and surrounded by the left petrous apex lesion without significant irregularity or stenosis. 3. 2 mm right paraophthalmic aneurysm.   MRA NECK IMPRESSION:   Normal MRA of the neck with wide patency of the carotid and vertebral arteries bilaterally. No hemodynamically significant stenosis or other acute vascular abnormality identified.    01/14/2019 - 01/27/2019 Radiation Therapy   Radaition to Left petrous apex bone lesion 01/14/19-01/27/19 with Dr. Tammi Klippel.     04/01/2019 Pathology Results   Bone Marrow biopsy 04/01/19  DIAGNOSIS: BONE MARROW, ASPIRATE, CLOT, CORE: - Normocellular marrow with minimal involvement by plasma cell neoplasm - See comment PERIPHERAL BLOOD: - Macrocytic anemia and leukopenia -See complete blood count   04/09/2019 PET scan   PET 04/09/19  IMPRESSION: 1. Scattered lytic lesions of bone. A few of these demonstrate hypermetabolic activity, most notably the right occipital bone lesion; the lytic lesion in the approximately C7 spinous process; the lytic expansile lesion of the left fourth rib anteriorly; and a small lytic focus in the right femoral head. Other scattered lytic lesions have only low-grade activity. 2. Multiple fractures as detailed above. 3. Other imaging findings of potential clinical significance: Aortic Atherosclerosis (ICD10-I70.0). Coronary atherosclerosis.      CURRENT THERAPY:  Daratumumabevery 3 weeks. Dexamethasone a day before treatment.Dexa stopped in 07/2018 due to well controlled MM and high risk of fracture. Dara reduced to every 4 weeks due to COVID-19 starting 10/23/18.Returned to every 3 weeks starting 12/17/18. Restarted every 2 weeks starting 02/22/19   INTERVAL HISTORY:  Shannon Rush is here for a follow up and treatment. She presents to the clinic alone. She notes she has been tolerating every 2 weeks Dara well. She notes she has some soreness from bone marrow biopsy. She notes occasional pain of right occipital lobe for a couple of months. She also  notes pain of right lower leg for the past month. Both pain is mild overall.    REVIEW OF SYSTEMS:   Constitutional: Denies fevers, chills or abnormal weight loss Eyes: Denies blurriness of vision Ears, nose, mouth, throat, and face: Denies mucositis or sore throat Respiratory: Denies cough, dyspnea or wheezes Cardiovascular: Denies palpitation, chest discomfort or lower extremity swelling Gastrointestinal:  Denies nausea, heartburn or  change in bowel habits Skin: Denies abnormal skin rashes MSK: (+) Occasional Right occipital pain and right lower leg pain  Lymphatics: Denies new lymphadenopathy or easy bruising Neurological:Denies numbness, tingling or new weaknesses Behavioral/Psych: Mood is stable, no new changes  All other systems were reviewed with the patient and are negative.  MEDICAL HISTORY:  Past Medical History:  Diagnosis Date  . CAD (coronary artery disease)    a. inf STEMI (in [place] >>> [facility]) >>> LHC (8/15- [place]):  pRCA 60%, mid to dist RCA 50% >>> med rx  . Cardiomyopathy ([place])   . Cataract   . Chronic combined systolic and diastolic CHF (congestive heart failure) ([place])   . Cord compression ([place]) 07/13/12   MRI- diffuse myeloma involvement of T-L spine  . History of radiation therapy 07/13/12-07/27/12   spinal cord compression T3-T10,left scapula  . Hx of echocardiogram    Echo (03/02/14-done at [facility]):  Normal LVEF without significant regional wall motion abnormalities. Mild   . Multiple myeloma ([place]) 07/01/2012  . MVP (mitral valve prolapse)    a. trivial by echo 06/2017.  . OSTEOPOROSIS 06/11/2010   Multiple compression fractures; and spontaneous fracture of sternum Qualifier: Diagnosis of  By: [name], [name]     . Thoracic kyphosis 07/13/12   per MRI scan  . Unspecified deficiency anemia     SURGICAL HISTORY: Past Surgical History:  Procedure Laterality Date  . APPENDECTOMY    . CATARACT EXTRACTION, BILATERAL    . CESAREAN SECTION     x2   . COLONOSCOPY  2007   neg with Dr. [name]  . ELBOW SURGERY    . HIP SURGERY  2009   left  . TUBAL LIGATION      I have reviewed the social history and family history with the patient and they are unchanged from previous note.  ALLERGIES:  is allergic to zithromax [azithromycin]; zosyn [piperacillin sod-tazobactam so]; and quinolones.  MEDICATIONS:  Current Outpatient Medications   Medication Sig Dispense Refill  . acetaminophen (TYLENOL) 500 MG tablet Take 500 mg by mouth every 6 (six) hours as needed for moderate pain or fever.    Marland Kitchen acyclovir (ZOVIRAX) 400 MG tablet Take 1 tablet (400 mg total) by mouth 2 (two) times daily. 180 tablet 3  . aspirin EC 81 MG tablet Take 1 tablet (81 mg total) by mouth daily. 90 tablet 3  . atorvastatin (LIPITOR) 20 MG tablet Take 1 tablet (20 mg total) by mouth daily. 10 tablet 0  . Calcium Carbonate-Vit D-Min (CALCIUM 600+D PLUS MINERALS) 600-400 MG-UNIT TABS Take by mouth.    . doxycycline (VIBRA-TABS) 100 MG tablet Take 1 tablet (100 mg total) by mouth 2 (two) times daily. 14 tablet 0  . metoprolol succinate (TOPROL-XL) 25 MG 24 hr tablet Take 1 tablet (25 mg total) by mouth daily. 10 tablet 0   No current facility-administered medications for this visit.     PHYSICAL EXAMINATION: ECOG PERFORMANCE STATUS: 1 - Symptomatic but completely ambulatory  Vitals:   04/12/19 0904  BP: 113/70  Pulse: 84  Resp: 18  Temp: 98.3 F (36.8 C)  SpO2: 96%   Filed Weights   04/12/19 0904  Weight: 116 lb 1.6 oz (52.7 kg)    GENERAL:alert, no distress and comfortable SKIN: skin color, texture, turgor are normal, no rashes or significant lesions EYES: normal, Conjunctiva are pink and non-injected, sclera clear  NECK: supple, thyroid normal size, non-tender, without nodularity LYMPH:  no palpable lymphadenopathy in the cervical, axillary  LUNGS: clear to auscultation and percussion with normal breathing effort HEART: regular rate & rhythm and no murmurs and no lower extremity edema ABDOMEN:abdomen soft, non-tender and normal bowel sounds Musculoskeletal:no cyanosis of digits and no clubbing (+) Mild bulge at right occipital lobe (+) Tenderness of right lower leg.  NEURO: alert & oriented x 3 with fluent speech, no focal motor/sensory deficits  LABORATORY DATA:  I have reviewed the data as listed CBC Latest Ref Rng & Units 04/12/2019  04/01/2019 03/29/2019  WBC 4.0 - 10.5 K/uL 3.7(L) 3.6(L) 4.0  Hemoglobin 12.0 - 15.0 g/dL 10.5(L) 10.8(L) 10.6(L)  Hematocrit 36.0 - 46.0 % 31.8(L) 32.2(L) 31.9(L)  Platelets 150 - 400 K/uL 131(L) 163 187     CMP Latest Ref Rng & Units 04/12/2019 03/29/2019 03/08/2019  Glucose 70 - 99 mg/dL 91 107(H) 90  BUN 8 - 23 mg/dL 14 16 18   Creatinine 0.44 - 1.00 mg/dL 0.81 0.92 0.91  Sodium 135 - 145 mmol/L 137 138 139  Potassium 3.5 - 5.1 mmol/L 4.0 3.9 4.2  Chloride 98 - 111 mmol/L 106 106 106  CO2 22 - 32 mmol/L 23 24 23   Calcium 8.9 - 10.3 mg/dL 8.8(L) 9.0 9.4  Total Protein 6.5 - 8.1 g/dL 6.0(L) 6.1(L) 6.6  Total Bilirubin 0.3 - 1.2 mg/dL 0.6 0.7 0.8  Alkaline Phos 38 - 126 U/L 46 48 58  AST 15 - 41 U/L 18 17 29   ALT 0 - 44 U/L 12 9 17       RADIOGRAPHIC STUDIES: I have personally reviewed the radiological images as listed and agreed with the findings in the report. No results found.   ASSESSMENT & PLAN:  SHANTAE VANTOL is a 78 y.o. female with   1. IgG kappa Multiple myeloma s/p Auto SCT, intermediate risk, relapsed in 01/2016 -Diagnosed in 05/2012. Treated with induction chemo, radiation and BM transplant, but unfortunately progressed while on maintenance Revlimid 01/2016. She is currently onDaratumumabinfusion monotherapy -Ipreviouslystopped heroraldexadue to her high risk of fracture, and well controlled MM -Ipreviouslychanged her treatment to every 4 weeks due to COVID-19 pandemic, and changed back to every 3 weeks in early June, she is now on every 2 weeks due to slight disease progression, symptomatic bone lesion in left petrous apex for which she received radiation. -I personally reviewed and discussed her PET images from 04/09/19 with pt together today which showed Scattered lytic lesions of bone. A few of these demonstrate hypermetabolic activity, most notably the right occipital bone lesion, C7 spinous process, Left 4th rib, and right femoral head.  -We also discussed  her bone marrow biopsy from 04/02/19 which shows normocellular marrow with minimal involvement by plasma cell neoplasm (2%).  -She does note right posterior skull tenderness which corresponding to her right occipital lesion. May disuses with Rad Onc Dr Tammi Klippel about palliative RT if needed.  -She is clinically stable otherwise. Physical exam unremarkable except mild bulge at right occipital lobe and tenderness at right lower leg. Labs reviewed, CBC and CMP WNL except WBC 3.7, Hg 10.5, PLT 111K,  Ca 8.8, protein 6 . Overall adequate to proceed with Dara today.  -I will discuss her results and next steps in treatment with Dr. [name]. We may add on second agent (Velcade or Pomalyst) to her current regimen or switching to next line therapy  -She has had her flu shot for this year. She also notes she had her first new shingles shot. Given she is immunocompromised she may not proceed with last injection.  -F/u in 2 weeks   2. Left petrous apex bone lesion -Her inner ear issues and dizziness progressed and she developedmildbalance issueand nausea  -Her MRI brain from 01/07/19 showedabnormal osseous lesion measuring up to approximately 5.9 cm centered at the left petrous apex. Thismost likely is the cause of her underlyingleft ear and balancesymptoms. -Given she is symptomatic, sheunderwent target RT with Dr Tammi Klippel 7/1-7/15.Per patient she does not feel any changes in her symptoms.  -this is negative on recent PET scan  -She has stable muffled hearing and mild imbalance issue.   3. History of multiple pneumonia andbronchitis -She has had multiple episodes of pneumonia, required hospitalization,and bronchitis, required antibiotics -She has recovered well, will continue monitoring. I encouraged her to do spirometry,and exercise. -Her vaccines are up-to-date.  4. Pancytopenia -Secondary to [place].WBC3.7, Hg10.5, PLT 111Kand ANC normal today (04/12/19)  5. CHF, CAD, inferior STEMI in  02/2014, H[facility] -She is on ASA, BB, statin -f/u with Dr. Meda Coffee and continue medications -BP well controlled and normal lately.   6. Osteoporosis with multiple fractures -Last DEXA in 2015 showed worsening osteoporosis. She declined repeat DEXA. -She had a fall induced fracture in 01/2018. -She was on Zometa for 4 years, stopped after April 2018  7. Peripheral neuropathy in feet, G1-2 -Secondary to previous chemo. -She previously declined Gabapentin  -I will monitor  8. Goal of care discussion  -she is full code now   Plan -Labs reviewed and adequate to proceed with Tonette Bihari today and continue every 2 weeks  -I sent an email to Dr. Doristine Devoid about her recent PET and bone marrow biopsy result to get her input  -Lab, f/u and dara in 2 and 4 weeks   No problem-specific Assessment & Plan notes found for this encounter.   No orders of the defined types were placed in this encounter.  All questions were answered. The patient knows to call the clinic with any problems, questions or concerns. No barriers to learning was detected. I spent 20 minutes counseling the patient face to face. The total time spent in the appointment was 30 minutes and more than 50% was on counseling and review of test results     [name], MD 04/12/2019   I, Joslyn Devon, am acting as scribe for [name], MD.   I have reviewed the above documentation for accuracy and completeness, and I agree with the above.

## 2019-04-09 ENCOUNTER — Other Ambulatory Visit (HOSPITAL_COMMUNITY): Payer: Medicare Other

## 2019-04-09 ENCOUNTER — Ambulatory Visit (HOSPITAL_COMMUNITY)
Admission: RE | Admit: 2019-04-09 | Discharge: 2019-04-09 | Disposition: A | Discharge status: Home / Self Care | Payer: Medicare Other | Source: Ambulatory Visit | Attending: Hematology | Admitting: Hematology

## 2019-04-09 ENCOUNTER — Other Ambulatory Visit: Payer: Self-pay

## 2019-04-09 DIAGNOSIS — C9000 Multiple myeloma not having achieved remission: Secondary | ICD-10-CM | POA: Insufficient documentation

## 2019-04-09 DIAGNOSIS — I2510 Atherosclerotic heart disease of native coronary artery without angina pectoris: Secondary | ICD-10-CM | POA: Diagnosis not present

## 2019-04-09 DIAGNOSIS — M899 Disorder of bone, unspecified: Secondary | ICD-10-CM | POA: Insufficient documentation

## 2019-04-09 LAB — GLUCOSE, CAPILLARY: Glucose-Capillary: 86 mg/dL (ref 70–99)

## 2019-04-09 MED ORDER — FLUDEOXYGLUCOSE F - 18 (FDG) INJECTION
5.7000 | Freq: Once | INTRAVENOUS | Status: AC | PRN
  Administered 2019-04-09: 5.7 via INTRAVENOUS

## 2019-04-12 ENCOUNTER — Inpatient Hospital Stay: Payer: Medicare Other | Admitting: Hematology

## 2019-04-12 ENCOUNTER — Encounter: Payer: Self-pay | Admitting: Hematology

## 2019-04-12 ENCOUNTER — Other Ambulatory Visit: Payer: Self-pay

## 2019-04-12 ENCOUNTER — Encounter (HOSPITAL_COMMUNITY): Payer: Self-pay | Admitting: Hematology

## 2019-04-12 ENCOUNTER — Inpatient Hospital Stay: Payer: Medicare Other

## 2019-04-12 ENCOUNTER — Inpatient Hospital Stay: Payer: Medicare Other | Admitting: Nutrition

## 2019-04-12 ENCOUNTER — Telehealth: Payer: Self-pay | Admitting: Hematology

## 2019-04-12 VITALS — BP 99/63 | HR 68 | Temp 98.0°F | Resp 18

## 2019-04-12 VITALS — BP 113/70 | HR 84 | Temp 98.3°F | Resp 18 | Ht 66.5 in | Wt 116.1 lb

## 2019-04-12 DIAGNOSIS — I252 Old myocardial infarction: Secondary | ICD-10-CM

## 2019-04-12 DIAGNOSIS — C9000 Multiple myeloma not having achieved remission: Secondary | ICD-10-CM

## 2019-04-12 DIAGNOSIS — D61818 Other pancytopenia: Secondary | ICD-10-CM | POA: Diagnosis not present

## 2019-04-12 DIAGNOSIS — C9001 Multiple myeloma in remission: Secondary | ICD-10-CM | POA: Diagnosis not present

## 2019-04-12 DIAGNOSIS — M810 Age-related osteoporosis without current pathological fracture: Secondary | ICD-10-CM | POA: Diagnosis not present

## 2019-04-12 DIAGNOSIS — M818 Other osteoporosis without current pathological fracture: Secondary | ICD-10-CM | POA: Diagnosis not present

## 2019-04-12 DIAGNOSIS — Z5112 Encounter for antineoplastic immunotherapy: Secondary | ICD-10-CM | POA: Diagnosis not present

## 2019-04-12 DIAGNOSIS — Z923 Personal history of irradiation: Secondary | ICD-10-CM | POA: Diagnosis not present

## 2019-04-12 DIAGNOSIS — Z7982 Long term (current) use of aspirin: Secondary | ICD-10-CM | POA: Diagnosis not present

## 2019-04-12 DIAGNOSIS — Z79899 Other long term (current) drug therapy: Secondary | ICD-10-CM | POA: Diagnosis not present

## 2019-04-12 DIAGNOSIS — Z9221 Personal history of antineoplastic chemotherapy: Secondary | ICD-10-CM | POA: Diagnosis not present

## 2019-04-12 DIAGNOSIS — E785 Hyperlipidemia, unspecified: Secondary | ICD-10-CM | POA: Diagnosis not present

## 2019-04-12 DIAGNOSIS — M899 Disorder of bone, unspecified: Secondary | ICD-10-CM | POA: Diagnosis not present

## 2019-04-12 DIAGNOSIS — Z9481 Bone marrow transplant status: Secondary | ICD-10-CM | POA: Diagnosis not present

## 2019-04-12 DIAGNOSIS — G629 Polyneuropathy, unspecified: Secondary | ICD-10-CM | POA: Diagnosis not present

## 2019-04-12 LAB — CBC WITH DIFFERENTIAL (CANCER CENTER ONLY)
Abs Immature Granulocytes: 0.02 10*3/uL (ref 0.00–0.07)
Basophils Absolute: 0.1 10*3/uL (ref 0.0–0.1)
Basophils Relative: 1 %
Eosinophils Absolute: 0.2 10*3/uL (ref 0.0–0.5)
Eosinophils Relative: 6 %
HCT: 31.8 % — ABNORMAL LOW (ref 36.0–46.0)
Hemoglobin: 10.5 g/dL — ABNORMAL LOW (ref 12.0–15.0)
Immature Granulocytes: 1 %
Lymphocytes Relative: 12 %
Lymphs Abs: 0.4 10*3/uL — ABNORMAL LOW (ref 0.7–4.0)
MCH: 35.4 pg — ABNORMAL HIGH (ref 26.0–34.0)
MCHC: 33 g/dL (ref 30.0–36.0)
MCV: 107.1 fL — ABNORMAL HIGH (ref 80.0–100.0)
Monocytes Absolute: 0.4 10*3/uL (ref 0.1–1.0)
Monocytes Relative: 11 %
Neutro Abs: 2.6 10*3/uL (ref 1.7–7.7)
Neutrophils Relative %: 69 %
Platelet Count: 131 10*3/uL — ABNORMAL LOW (ref 150–400)
RBC: 2.97 MIL/uL — ABNORMAL LOW (ref 3.87–5.11)
RDW: 14.4 % (ref 11.5–15.5)
WBC Count: 3.7 10*3/uL — ABNORMAL LOW (ref 4.0–10.5)
nRBC: 0 % (ref 0.0–0.2)

## 2019-04-12 LAB — CMP (CANCER CENTER ONLY)
ALT: 12 U/L (ref 0–44)
AST: 18 U/L (ref 15–41)
Albumin: 3.8 g/dL (ref 3.5–5.0)
Alkaline Phosphatase: 46 U/L (ref 38–126)
Anion gap: 8 (ref 5–15)
BUN: 14 mg/dL (ref 8–23)
CO2: 23 mmol/L (ref 22–32)
Calcium: 8.8 mg/dL — ABNORMAL LOW (ref 8.9–10.3)
Chloride: 106 mmol/L (ref 98–111)
Creatinine: 0.81 mg/dL (ref 0.44–1.00)
GFR, Est AFR Am: >60 mL/min (ref >60)
GFR, Estimated: >60 mL/min (ref >60)
Glucose, Bld: 91 mg/dL (ref 70–99)
Potassium: 4 mmol/L (ref 3.5–5.1)
Sodium: 137 mmol/L (ref 135–145)
Total Bilirubin: 0.6 mg/dL (ref 0.3–1.2)
Total Protein: 6 g/dL — ABNORMAL LOW (ref 6.5–8.1)

## 2019-04-12 MED ORDER — MONTELUKAST SODIUM 10 MG PO TABS
10.0000 mg | ORAL_TABLET | Freq: Once | ORAL | Status: AC
  Administered 2019-04-12: 10 mg via ORAL

## 2019-04-12 MED ORDER — SODIUM CHLORIDE 0.9 % IV SOLN
Freq: Once | INTRAVENOUS | Status: AC
  Administered 2019-04-12: 10:00:00 via INTRAVENOUS
  Filled 2019-04-12: qty 250

## 2019-04-12 MED ORDER — SODIUM CHLORIDE 0.9 % IV SOLN
16.0000 mg/kg | Freq: Once | INTRAVENOUS | Status: AC
  Administered 2019-04-12: 900 mg via INTRAVENOUS
  Filled 2019-04-12: qty 40

## 2019-04-12 MED ORDER — ACETAMINOPHEN 325 MG PO TABS
650.0000 mg | ORAL_TABLET | Freq: Once | ORAL | Status: AC
  Administered 2019-04-12: 650 mg via ORAL

## 2019-04-12 MED ORDER — MONTELUKAST SODIUM 10 MG PO TABS
ORAL_TABLET | ORAL | Status: AC
  Filled 2019-04-12: qty 1

## 2019-04-12 MED ORDER — METHYLPREDNISOLONE SODIUM SUCC 125 MG IJ SOLR
INTRAMUSCULAR | Status: AC
  Filled 2019-04-12: qty 2

## 2019-04-12 MED ORDER — ACETAMINOPHEN 325 MG PO TABS
ORAL_TABLET | ORAL | Status: AC
  Filled 2019-04-12: qty 2

## 2019-04-12 MED ORDER — DIPHENHYDRAMINE HCL 25 MG PO CAPS
50.0000 mg | ORAL_CAPSULE | Freq: Once | ORAL | Status: AC
  Administered 2019-04-12: 50 mg via ORAL

## 2019-04-12 MED ORDER — METHYLPREDNISOLONE SODIUM SUCC 125 MG IJ SOLR
125.0000 mg | Freq: Once | INTRAMUSCULAR | Status: AC
  Administered 2019-04-12: 125 mg via INTRAVENOUS

## 2019-04-12 MED ORDER — DIPHENHYDRAMINE HCL 25 MG PO CAPS
ORAL_CAPSULE | ORAL | Status: AC
  Filled 2019-04-12: qty 2

## 2019-04-12 NOTE — Patient Instructions (Signed)
[facility] Discharge Instructions for Patients Receiving Chemotherapy  Today you received the following chemotherapy agents: Darzalex  To help prevent nausea and vomiting after your treatment, we encourage you to take your nausea medication as directed.    If you develop nausea and vomiting that is not controlled by your nausea medication, call the clinic.   BELOW ARE SYMPTOMS THAT SHOULD BE REPORTED IMMEDIATELY:  *FEVER GREATER THAN 100.5 F  *CHILLS WITH OR WITHOUT FEVER  NAUSEA AND VOMITING THAT IS NOT CONTROLLED WITH YOUR NAUSEA MEDICATION  *UNUSUAL SHORTNESS OF BREATH  *UNUSUAL BRUISING OR BLEEDING  TENDERNESS IN MOUTH AND THROAT WITH OR WITHOUT PRESENCE OF ULCERS  *URINARY PROBLEMS  *BOWEL PROBLEMS  UNUSUAL RASH Items with * indicate a potential emergency and should be followed up as soon as possible.  Feel free to call the clinic should you have any questions or concerns. The clinic phone number is [phone].  Please show the CHEMO ALERT CARD at check-in to the Emergency Department and triage nurse.   

## 2019-04-12 NOTE — Telephone Encounter (Signed)
Scheduled appt per 9/28 los. °

## 2019-04-12 NOTE — Progress Notes (Signed)
Patient was identified to be at risk for malnutrition on the MST secondary to weight loss and poor appetite.  78 year old female diagnosed with multiple myeloma.  She is a patient of Dr. Burr Medico.  Past medical history includes osteoporosis, MVP, history of radiation therapy, CHF, CAD, and anemia.  Medications include Lipitor, calcium with vitamin D.  Labs were reviewed.  Height: 5 feet 6-1/2 inches. Weight: 116.1 pounds. Usual body weight: 122 pounds. BMI: 18.46.  Patient is pleased she has gained 2 pounds since last visit. She has been eating more ice cream and peanut butter pretzels. States she does occasionally feel a bit queasy which causes decreased appetite. She denies constipation. Refuses oral nutrition supplements.  Nutrition diagnosis: Unintended weight loss related to multiple myeloma as evidenced by 8 pound weight loss from usual weight.  Intervention: Patient was educated to consume small, frequent meals and snacks with higher calorie, higher protein foods. Reviewed strategies for adding calories and protein. Provided fact sheets on poor appetite and higher calorie and protein foods along with smoothie recipes. Questions were answered.  Teach back method used.  Contact information provided.  Monitoring, evaluation, goals: Patient will tolerate adequate calories and protein to minimize weight loss.  Next visit: To be scheduled if needed.  **Disclaimer: This note was dictated with voice recognition software. Similar sounding words can inadvertently be transcribed and this note may contain transcription errors which may not have been corrected upon publication of note.**

## 2019-04-20 ENCOUNTER — Ambulatory Visit: Payer: Medicare Other

## 2019-04-20 ENCOUNTER — Ambulatory Visit: Payer: Medicare Other | Admitting: Nurse Practitioner

## 2019-04-20 ENCOUNTER — Other Ambulatory Visit: Payer: Medicare Other

## 2019-04-22 NOTE — Progress Notes (Signed)
[place]   [phone] [phone]   Clinic Follow up Note   Patient Care Team: [name], MD as PCP - General (Family Medicine) [name], MD as PCP - Cardiology (Cardiology) [name], MD as Attending Physician (Obstetrics and Gynecology) [name], MD as Consulting Physician (Internal Medicine) [name], MD as Consulting Physician (Hematology) [name], MD as Consulting Physician (Ophthalmology) [name], MD as Consulting Physician (Cardiology) [name], MD as Consulting Physician (Dermatology) [name] (Dentistry) [name], DPM as Consulting Physician (Podiatry) Pleasant, Eppie Gibson, RN as [facility]  Date of Service:  04/26/2019  CHIEF COMPLAINT: F/u on MM  SUMMARY OF ONCOLOGIC HISTORY: Oncology History Overview Note  Multiple myeloma [facility])   Staging form: Multiple Myeloma, AJCC 6th Edition   - Clinical: Stage IIIA - Signed by [name], MD on 09/04/2013    Multiple myeloma ([place])  05/26/2012 Initial Diagnosis   Presenting IgG was 4,040 mg/dL on 06/05/2012 (IgA 35; IgM 34); kappa free light chain 34.4 mg/dL, lambda 0.00, kappa:lambda ratio of 34.75; SPEP with M-spike of 2.60.   06/30/2012 Imaging   Numerous lytic lesions throughout the calvarium.  Lytic lesion within the left lateral scapula.  Findings most compatible with metastases or myeloma. Multiple mid and lower thoracic compression fractures.  Slight compression through the endplates at L3.   94/58/5929 Bone Marrow Biopsy   Bone marrow biopsy showed 49% plasma cell. Normal classical cytogenetics; however, myeloma FISH panel showed 13q- (intermediate risk)       07/14/2012 - 07/27/2012 Radiation Therapy   Palliative radiation 20 Gy over 10 fractions between to thoracic spine cord compression and symptomatic left scapula lytic lesion.   08/10/2012 - 11/2012 Chemotherapy   Started SQ Velcade once weekly, 3 weeks on, 1 weeks off; daily Revlimid d1-21, 7 days off; and Dexamethasone 18m PO weekly.    02/12/2013 Bone Marrow Transplant   [facility]    06/14/2013 Tumor Marker   IgG  1830,  Kappa:lamba ratio, 1.69 (baseline was 34.75 or   M-spike 0.28 (baseline of 2.6 or  89.3% of baseline).     06/25/2013 -  Chemotherapy   Started zometa 3.5 mg monthly    09/01/2013 - 01/2014 Chemotherapy   Maintenance therapy with revlimid 566mdaily for 14 days and then off for 7 (decreased from 21 days on and 7 days off based on neutropenia). Stopped due to disease progression 01/2017   09/13/2013 - 09/17/2014 Hospital Admission   Hospital admission for pneumonia   12/20/2013 Treatment Plan Change   Maintenance therapy decreased to 2.5 mg daily for 21 days and then off for 7 days based on low counts.    01/25/2014 Tumor Marker   IgG 1360 M spike 0.5   03/01/2014 - 03/05/2014 Hospital Ad[facility] received thromb[facility] therapy, cath showed 60% prox RCA, mid-distal RCA 50%, Echo normal, no stents.   04/11/2014 - 08/07/2014 Chemotherapy   Continue Revlimid at 2.5 mg 3 weeks on 1 week off   08/08/2014 - 08/13/2014 Hospital Admission   Hospital admission for pneumonia. Her Revlimid was held.   08/29/2014 - 09/14/2014 Chemotherapy   Maintenance Revlimid restarted   09/14/2014 - 09/17/2014 Hospital Admission   [place].   11/13/2014 - 12/25/2015 Chemotherapy   Maintenance Revlimid restarted, changed to  2.19m daily, 2 weks on, 1 week off from 12/15/2014, stopped due to disease progression    01/24/2016 Progression   Patient is M protein has gradually increased to 1.0g, repeated a bone marrow biopsy showed plasma cell 10-30%   02/27/2016 - 03/07/2016 Chemotherapy   Pomalidomide 4 mg daily on day 1-21, dexamethasone 40 mg on day 1,  8 and 15, every 28 days, started on 02/27/2016, daratumumab weekly started on 03/07/2016, held after first dose, due to hospitalization and a severe pancytopenia, pomalidomide was stopped afterwards    03/10/2016 - 03/29/2016 Hospital Admission   Pt was admitted for sepsis from pneumonia, and severe pancytopenia. She required ICU stay for a few days due to hypotension, developed b/l pleural effusion required diuretics and b/l thoracentesis. She also required blood and plt transfusion, and prolonged neupogen injection for severe neutropenia. She was discharged home after 3 weeks hospital stay    04/26/2016 -  Chemotherapy   Daratumumab restarted on 04/26/2016, Velcade 1.360mm2 and dexa 2038meekly start on 11/3, velcade held since 06/28/2016 due to CHF, dexa stopped on12/11/2017 due to high risk of fracture.  Dara reduced to every 4 weeks due to COVID-19 starting 10/23/18. Returned to every 3 weeks starting 12/17/18. Restarted every 2 weeks starting 02/22/19   08/26/2016 Echocardiogram   - Left ventricle: LVEF is approximately 35% with diffuse diffuse   hypokinesis with severe hypokinesis/ akinesis of the   inferior/inferoseptal walls. In comparison to echo images from   December 2017, there does not appear to be significant change The   cavity size was normal. Wall thickness was normal. Doppler   parameters are consistent with abnormal left ventricular   relaxation (grade 1 diastolic dysfunction). - Aortic valve: AV is thickened. 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 Present in echo from December 2017 There was   trivial regurgitation. - Right ventricle: Systolic function was mildly reduced. - Right atrium: The atrium was mildly dilated. - Pericardium, extracardiac: A trivial pericardial effusion was   identified.   12/03/2016 Echocardiogram   -Left Ventricle: Systolic function was   mildly to moderately reduced. The estimated  ejection fraction was   in the range of 40% to 45%. Diffuse hypokinesis. Doppler   parameters are consistent with abnormal left ventricular   relaxation (grade 1 diastolic dysfunction). Doppler parameters   are consistent with indeterminate ventricular filling pressure. - Left atrium: The atrium was severely dilated. - Tricuspid valve: There was trivial regurgitation. - Pulmonary arteries: Systolic pressure was within the normal   range. PA peak pressure: 33 mm Hg (S). - Global longitudinal strain -10.5% (abnormal)   03/12/2017 Echocardiogram   ECHO 03/12/17 Study Conclusions - Left ventricle: The cavity size was normal. Wall thickness was   increased in a pattern of mild LVH. Systolic function was normal.   The estimated ejection fraction was in the range of 50% to 55%.   There is hypokinesis of the basalinferior myocardium. Doppler   parameters are consistent with abnormal left ventricular   relaxation (grade 1 diastolic dysfunction). - Aortic valve: There was trivial regurgitation. - Mitral valve: Calcified annulus. Mildly thickened leaflets .   There was trivial regurgitation. - Tricuspid valve: There was mild regurgitation. Impressions: - There has been mild improvement in EF since prior study.    12/12/2017 - 12/15/2017 Hospital Admission   Admission diagnosis: Pnuemonia and acute hypoxic respiratory failure Additional comments: treated for pneumoania and acute hypoxic respiratory failure before  she was discharged on 12/15/17 with oral anitbiotics. She did not require home oxygen.    12/30/2017 - 01/02/2018 Hospital Admission   Admission diagnosis: Pnuemonia  Additional comments: She was again hospitalized on 12/30/17 for pneumonia, ID work up was otherwise negative, she was discharged home with oral antibiotics.    04/01/2018 Imaging   04/01/2018 CXR IMPRESSION: Persisting airspace disease superimposed on chronic changes, suggesting pneumonia given the history. Followup PA and  lateral chest X-ray is recommended in 3-4 weeks following therapy to ensure resolution and exclude underlying malignancy.  Redemonstration of mid and lower thoracic compression fractures with associated thoracic kyphosis.   04/23/2018 Echocardiogram   04/23/2018 ECHO LV EF: 45% -   50%   01/07/2019 Imaging   MRI Brain 01/07/19 IMPRESSION: MRI HEAD IMPRESSION:   1. 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 Lesion closely approximates the left inner ear structures and involves the left Meckel's cave and left fifth cranial nerve, and most likely is the causative etiology for patient's underlying left ear and facial symptoms. Follow-up examination with postcontrast imaging suggested for complete evaluation. Inclusion of IAC sequences would likely be helpful for visualization. 2. Additional 2.7 cm lesion involving the right occipital calvarium with adjacent marrow signal abnormality, also likely related to multiple myeloma. Metastatic disease would be the primary differential consideration. 3. Underlying age-appropriate cerebral atrophy with mild chronic microvascular ischemic disease. No other acute intracranial abnormality.   MRA HEAD IMPRESSION:   1. Negative intracranial MRA with no large vessel occlusion, hemodynamically significant stenosis, or other acute vascular abnormality. 2. Petrous and cavernous left ICA is partially encased and surrounded by the left petrous apex lesion without significant irregularity or stenosis. 3. 2 mm right paraophthalmic aneurysm.   MRA NECK IMPRESSION:   Normal MRA of the neck with wide patency of the carotid and vertebral arteries bilaterally. No hemodynamically significant stenosis or other acute vascular abnormality identified.    01/14/2019 - 01/27/2019 Radiation Therapy   Radaition to Left petrous apex  bone lesion 01/14/19-01/27/19 with Dr. Tammi Klippel.    04/01/2019 Pathology Results   Bone Marrow biopsy 04/01/19  DIAGNOSIS: BONE MARROW, ASPIRATE, CLOT, CORE: - Normocellular marrow with minimal involvement by plasma cell neoplasm - See comment PERIPHERAL BLOOD: - Macrocytic anemia and leukopenia -See complete blood count   04/09/2019 PET scan   PET 04/09/19  IMPRESSION: 1. Scattered lytic lesions of bone. A few of these demonstrate hypermetabolic activity, most notably the right occipital bone lesion; the lytic lesion in the approximately C7 spinous process; the lytic expansile lesion of the left fourth rib anteriorly; and a small lytic focus in the right femoral head. Other scattered lytic lesions have only low-grade activity. 2. Multiple fractures as detailed above. 3. Other imaging findings of potential clinical significance: Aortic Atherosclerosis (ICD10-I70.0). Coronary atherosclerosis.      CURRENT THERAPY:  Daratumumabevery 3 weeks. Dexamethasone a day before treatment.Dexa stopped in 07/2018 due to well controlled MM and high risk of fracture. Dara reduced to every 4 weeks due to COVID-19 starting 10/23/18.Returned to every 3 weeks starting 12/17/18. Restarted every 2 weeks starting 02/22/19  INTERVAL HISTORY:  Shannon Rush is here for a follow up and treatment. She presents to the clinic alone. She notes she is stable. She notes right occipital pain still based on position and some dizziness with standing. She notes this is manageable overall. She has not been  contacted by Dr. Jackqulyn Livings. She notes she has muscle cramps often. She has been drinking tonic water to help.     REVIEW OF SYSTEMS:   Constitutional: Denies fevers, chills or abnormal weight loss (+) dizziness upon standing  Eyes: Denies blurriness of vision Ears, nose, mouth, throat, and face: Denies mucositis or sore throat Respiratory: Denies cough, dyspnea or wheezes Cardiovascular: Denies palpitation, chest  discomfort or lower extremity swelling Gastrointestinal:  Denies nausea, heartburn or change in bowel habits Skin: Denies abnormal skin rashes MSK: (+) Right occipital lobe pain, manageable (+) Muscle cramps  Lymphatics: Denies new lymphadenopathy or easy bruising Neurological:Denies numbness, tingling or new weaknesses Behavioral/Psych: Mood is stable, no new changes  All other systems were reviewed with the patient and are negative.  MEDICAL HISTORY:  Past Medical History:  Diagnosis Date  . CAD (coronary artery disease)    a. inf STEMI (in [place] >>> [facility]ytics) >>> LHC (8/15- [place]):  pRCA 60%, mid to dist RCA 50% >>> med rx  . Cardiomyopathy ([place])   . Cataract   . Chronic combined systolic and diastolic CHF (congestive heart failure) ([place])   . Cord compression ([place]) 07/13/12   MRI- diffuse myeloma involvement of T-L spine  . History of radiation therapy 07/13/12-07/27/12   spinal cord compression T3-T10,left scapula  . 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 Multiple myeloma ([place]) 07/01/2012  . MVP (mitral valve prolapse)    a. trivial by echo 06/2017.  . OSTEOPOROSIS 06/11/2010   Multiple compression fractures; and spontaneous fracture of sternum Qualifier: Diagnosis of  By: [name], [name]     . Thoracic kyphosis 07/13/12   per MRI scan  . Unspecified deficiency anemia     SURGICAL HISTORY: Past Surgical History:  Procedure Laterality Date  . APPENDECTOMY    . CATARACT EXTRACTION, BILATERAL    . CESAREAN SECTION     x2   . COLONOSCOPY  2007   neg with Dr. [name]  . ELBOW SURGERY    . HIP SURGERY  2009   left  . TUBAL LIGATION      I have reviewed the social history and family history with the patient and they are unchanged from previous note.  ALLERGIES:  is allergic to zithromax [azithromycin]; zosyn [piperacillin sod-tazobactam so]; and quinolones.  MEDICATIONS:  Current Outpatient Medications  Medication Sig Dispense Refill  .  acetaminophen (TYLENOL) 500 MG tablet Take 500 mg by mouth every 6 (six) hours as needed for moderate pain or fever.    Shannon Rush Kitchen acyclovir (ZOVIRAX) 400 MG tablet Take 1 tablet (400 mg total) by mouth 2 (two) times daily. 180 tablet 3  . aspirin EC 81 MG tablet Take 1 tablet (81 mg total) by mouth daily. 90 tablet 3  . atorvastatin (LIPITOR) 20 MG tablet Take 1 tablet (20 mg total) by mouth daily. 10 tablet 0  . Calcium Carbonate-Vit D-Min (CALCIUM 600+D PLUS MINERALS) 600-400 MG-UNIT TABS Take by mouth.    . doxycycline (VIBRA-TABS) 100 MG tablet Take 1 tablet (100 mg total) by mouth 2 (two) times daily. 14 tablet 0  . metoprolol succinate (TOPROL-XL) 25 MG 24 hr tablet Take 1 tablet (25 mg total) by mouth daily. 10 tablet 0   No current facility-administered medications for this visit.     PHYSICAL EXAMINATION: ECOG PERFORMANCE STATUS: 1 - Symptomatic but completely ambulatory  Vitals:   04/26/19 0914  BP: 107/65  Pulse: 76  Resp: 18  Temp: 98 F (36.7  C)  SpO2: 95%   Filed Weights   04/26/19 0914  Weight: 115 lb (52.2 kg)   GENERAL:alert, no distress and comfortable SKIN: skin color, texture, turgor are normal, no rashes or significant lesions, slightly deform right posterior skull with mild tenderness   EYES: normal, Conjunctiva are pink and non-injected, sclera clear NECK: supple, thyroid normal size, non-tender, without nodularity LYMPH:  no palpable lymphadenopathy in the cervical, axillary  LUNGS: clear to auscultation and percussion with normal breathing effort HEART: regular rate & rhythm and no murmurs and no lower extremity edema ABDOMEN:abdomen soft, non-tender and normal bowel sounds Musculoskeletal:no cyanosis of digits and no clubbing  NEURO: alert & oriented x 3 with fluent speech, no focal motor/sensory deficits  LABORATORY DATA:  I have reviewed the data as listed CBC Latest Ref Rng & Units 04/26/2019 04/12/2019 04/01/2019  WBC 4.0 - 10.5 K/uL 3.5(L) 3.7(L) 3.6(L)   Hemoglobin 12.0 - 15.0 g/dL 10.6(L) 10.5(L) 10.8(L)  Hematocrit 36.0 - 46.0 % 31.3(L) 31.8(L) 32.2(L)  Platelets 150 - 400 K/uL 171 131(L) 163     CMP Latest Ref Rng & Units 04/26/2019 04/12/2019 03/29/2019  Glucose 70 - 99 mg/dL 95 91 107(H)  BUN 8 - 23 mg/dL _0 Creatinine 0.44 - 1.00 mg/dL 0.89 0.81 0.92  Sodium 135 - 145 mmol/L 138 137 138  Potassium 3.5 - 5.1 mmol/L 3.9 4.0 3.9  Chloride 98 - 111 mmol/L 106 106 106  CO2 22 - 32 mmol/L _1 Calcium 8.9 - 10.3 mg/dL 8.9 8.8(L) 9.0  Total Protein 6.5 - 8.1 g/dL 6.3(L) 6.0(L) 6.1(L)  Total Bilirubin 0.3 - 1.2 mg/dL 0.7 0.6 0.7  Alkaline Phos 38 - 126 U/L 43 46 48  AST 15 - 41 U/L _2 ALT 0 - 44 U/L _3 RADIOGRAPHIC STUDIES: I have personally reviewed the radiological images as listed and agreed with the findings in the report. No results found.   ASSESSMENT & PLAN:  LADAIJA DIMINO is a 78 y.o. female with   1. IgG kappa Multiple myeloma s/p Auto SCT, intermediate risk, relapsed in 01/2016 -Diagnosed in 05/2012. Treated with induction chemo, radiation and BM transplant, but unfortunately progressed while on maintenance Revlimid 01/2016. She is currently onDaratumumabinfusion monotherapy -Ipreviouslystopped heroraldexadue to her high risk of fracture, and well controlled MM -Ipreviouslychanged her treatment to every 4 weeks due to COVID-19 pandemic, and changed back to every 3 weeks in early June, she is now on every 2 weeks due to slight disease progression, symptomatic bone lesion in left petrous apex for which she received radiation. -PET images from 04/09/19 showed Scattered lytic lesions of bone. A few of these demonstrate hypermetabolic activity, most notably the right occipital bone lesion, C7 spinous process, Left 4th rib, and right femoral head.  -Her bone marrow biopsy from 04/02/19 which shows normocellular marrow with minimal involvement by plasma cell neoplasm (2%).  -I have sent her PET  and BM biopsy results to Dr. [name]. I discussed with patient the options of adding Pomalyst or Velcade or switch to next line treatment, will wait for Dr. Noah Delaine recommendation, and may start next cycle.  -Labs reviewed, CBC and CMP WNL except WBC 3.5, Hg 10.6, lymphocytes 0.4, protein 6.3. MM panel still pending. Overall adequate to proceed with Dara today.  -F/u in 2 weeks    2. Left petrous apex bone lesion -Her inner ear issues and dizziness progressed and she developedmildbalance issueand  nausea  -Her MRI brain from 01/07/19 showedabnormal osseous lesion measuring up to approximately 5.9 cm centered at the left petrous apex. Thismost likely is the cause of her underlyingleft ear and balancesymptoms. -Given she is symptomatic, sheunderwent target RT with Dr Tammi Klippel 7/1-7/15.Per patient she does not feel any changes in her symptoms.  -this is negative on 04/09/19 PET scan  -She has stable muffled hearing and mild imbalanceissue.  3. Right Occipital skull Lesion -She has bulge on prior exam at right occipital skull with tenderness -Her PET from 04/09/19 shows lesion with hypermetabolic activity  in this area concerning for MM progression here.  -I will discuss with Rad Onc Dr Tammi Klippel about palliative RT.    4. History of multiple pneumonia andbronchitis -She has had multiple episodes of pneumonia, required hospitalization,and bronchitis, required antibiotics -She has recovered well, will continue monitoring. I encouraged her to do spirometry,and exercise. -Her vaccines are up-to-date.  5. Pancytopenia -Secondary to [place].WBC3.5, Hg10.6, PLT andANC normal today (04/26/19), overall mild   6. CHF, CAD, inferior STEMI in 02/2014, HLD -She is on ASA, BB, statin -f/u with Dr. Meda Coffee and continue medications -BP well controlled and normal lately.   7. Osteoporosis with multiple fractures -Last DEXA in 2015 showed worsening osteoporosis. She declined repeat  DEXA. -She had a fall induced fracture in 01/2018. -She was on Zometa for 4 years, stopped after April 2018  8. Peripheral neuropathy in feet, G1-2 -Secondary to previous chemo. -She previously declined Gabapentin  -I will monitor, stable   9. Goal of care discussion  -she is full code now   Plan -Labs reviewed and adequate to proceed with Dara today and continue every 2 weeks  -faxed PET and BM biopsy to Dr. Doristine Devoid today  -Lab, f/u and dara in 2 and 4 weeks    No problem-specific Assessment & Plan notes found for this encounter.   No orders of the defined types were placed in this encounter.  All questions were answered. The patient knows to call the clinic with any problems, questions or concerns. No barriers to learning was detected. I spent 20 minutes counseling the patient face to face. The total time spent in the appointment was 25 minutes and more than 50% was on counseling and review of test results     [name], MD 04/26/2019   I, Joslyn Devon, am acting as scribe for [name], MD.   I have reviewed the above documentation for accuracy and completeness, and I agree with the above.

## 2019-04-25 ENCOUNTER — Encounter: Payer: Self-pay | Admitting: Physician Assistant

## 2019-04-25 NOTE — Progress Notes (Signed)
Cardiology Office Note    Date:  04/27/2019   ID:  Shannon Rush, DOB 1941-03-25, MRN [id number]  PCP:  [name], MD  Cardiologist:  Ena Dawley, MD  Electrophysiologist:  None   Chief Complaint: routine 6 month f/u, last seen 08/2018  History of Present Illness:   Shannon Rush is a 78 y.o. female retired [employer] member with history of multiple myeloma s/p bone marrow transplant (followed at [facility] by Dr. [name] and in [place] by Dr. Burr Medico), osteoporosis, HLD, CAD (inferior STEMI s/p thrombolytics 02/2014), chronic combined CHF (?chemo induced) who presents to clinic for routine 6 month follow-up. Despite history outlined below she is in good shape and often rides her bike to office visits.  She was originally seen by Dr. Johnsie Cancel in 12/2012 for abnormal echo. Notes from Care Everywhere at [facility] indicate TEE demonstrated what appeared to be a Lambl's excrescence. She was admitted 02/2014 to the [facility] with an inferior STEMI. She received thrombolytics. Cardiac catheterization demonstrated 60% proximal RCA, mid to distal RCA 50% (lesion segmental and thrombotic). Echocardiogram demonstrated normal LV function. No intervention was performed. Recommendation was for the patient to return to [facility] and have a nuclear stress test in 6 weeks. I do not see this was ordered after she reestablished care in our practice with Dr. Meda Rush. Patient was on Revlimid around that time (for pancytopenia from chemotherapy) and the DC summary from the hospital in [place] reportedly cited this as a possible cause for her STEMI. She has a known hx of multiple myeloma since 2013 treated with Revlimid, subsquently switched to pomalidomide, dexamethasone, daratumumab with resultant pancytopenia. She was admitted from 02/2016 for sepsis and acute respiratory failure 2/2 HCAP with acute on chronic systolic CHF with pleural effusions. The pt was shocky and  required IVF, transfer to ICU, and IV pressors. She was noted to be thrombocytopenic and her Plavix was stopped. She had persistent hypoxia and moderate bilateral effusions. She was given Lasix but this was later stopped secondary to hypotension.She required bilateral thoracocentesis, IV diuresis and blood transfusions with symptomatic improvement. She was discharged on Lasix PRN. She was admitted with bilateral pneumonia in 12/2017 and sustained a pelvic fracture in 01/2018. She was on Velcade and Zometa at one point. Regimen was eventually changed to Daratumumab/Dexamathasone. Dexamethasone was stopped in 07/2018 due to controlled MM and risk of fracture, and she's since been followed by heme-onc while receiving Dara treatments.  2D echoes continue to be followed approximately every 3 months while on chemotherapy per Dr. Francesca Oman request, although the patient has requested to push them out further between. Last echo 11/2018 showed EF 40-45%, impaired diastolic relaxation, moderate LAE, mild RAE, sclerotic aortic valve with small oscillating density (described on previous studies; possible Lambl's excrescence) and trace AI. Dr. Meda Rush reviewed study and felt it was stable. Last labs 03/2019 showed Hgb 10.5, plt 131, K 4.0, Cr 0.81, normal LFTs, 03/2018 showed LDL 57 (Dr. Meda Rush has previously recommended to reduce atorvastatin to 17m daily and this level reflects that).  Ms. MMuratorereturns for follow-up today and is doing great from a cardiac standpoint without angina, dyspnea, edema, orthopnea, syncope or palpitations. Blood pressure remains low normal which has prohibited aggressive med titration in the past. She continues to ride her bike in the community and did so to attend today's appointment. She and her husband have the philosophy that if they can arrive by bike, that's what they should do.  Past Medical History:  Diagnosis Date  . CAD (coronary artery disease)    a. inf STEMI (in [place] >>>  lytics) >>> LHC (8/15- [place]):  pRCA 60%, mid to dist RCA 50% >>> med rx  . Cardiomyopathy ([place])   . Cataract   . Chronic combined systolic and diastolic CHF (congestive heart failure) ([place])   . Cord compression ([place]) 07/13/12   MRI- diffuse myeloma involvement of T-L spine  . History of radiation therapy 07/13/12-07/27/12   spinal cord compression T3-T10,left scapula  . 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 Multiple myeloma ([place]) 07/01/2012  . MVP (mitral valve prolapse)    a. trivial by echo 06/2017.  . OSTEOPOROSIS 06/11/2010   Multiple compression fractures; and spontaneous fracture of sternum Qualifier: Diagnosis of  By: Shannon Iba NP, Shannon Rush     . Thoracic kyphosis 07/13/12   per MRI scan  . Unspecified deficiency anemia     Past Surgical History:  Procedure Laterality Date  . APPENDECTOMY    . CATARACT EXTRACTION, BILATERAL    . CESAREAN SECTION     x2   . COLONOSCOPY  2007   neg with Dr. [name]  . ELBOW SURGERY    . HIP SURGERY  2009   left  . TUBAL LIGATION      Current Medications: Current Meds  Medication Sig  . acetaminophen (TYLENOL) 500 MG tablet Take 500 mg by mouth every 6 (six) hours as needed for moderate pain or fever.  Marland Kitchen acyclovir (ZOVIRAX) 400 MG tablet Take 1 tablet (400 mg total) by mouth 2 (two) times daily.  Marland Kitchen aspirin EC 81 MG tablet Take 1 tablet (81 mg total) by mouth daily.  Marland Kitchen atorvastatin (LIPITOR) 20 MG tablet Take 1 tablet (20 mg total) by mouth daily.  . Calcium Carbonate-Vit D-Min (CALCIUM 600+D PLUS MINERALS) 600-400 MG-UNIT TABS Take by mouth.  . doxycycline (VIBRA-TABS) 100 MG tablet Take 100 mg by mouth as needed.  . metoprolol succinate (TOPROL-XL) 25 MG 24 hr tablet Take 1 tablet (25 mg total) by mouth daily.      Allergies:   Zithromax [azithromycin], Zosyn [piperacillin sod-tazobactam so], and Quinolones   Social History   Socioeconomic History  . Marital status: Married    Spouse name: Shannon Rush  . Number of  children: 2  . Years of education: Not on file  . Highest education level: Doctorate  Occupational History  . Occupation: Museum/gallery conservator: [employer]  . Financial resource strain: Not hard at all  . Food insecurity    Worry: Never true    Inability: Never true  . Transportation needs    Medical: No    Non-medical: No  Tobacco Use  . Smoking status: Never Smoker  . Smokeless tobacco: Never Used  Substance and Sexual Activity  . Alcohol use: Yes    Alcohol/week: 7.0 standard drinks    Types: 7 Standard drinks or equivalent per week    Comment: 1 glass of wine or beer 3-5 days per week  . Drug use: No  . Sexual activity: Yes    Birth control/protection: Post-menopausal  Lifestyle  . Physical activity    Days per week: 5 days    Minutes per session: 60 min  . Stress: Not at all  Relationships  . Social connections    Talks on phone: More than three times a week    Gets together: More than three times a week  Attends religious service: Never    Active member of club or organization: Yes    Attends meetings of clubs or organizations: More than 4 times per year    Relationship status: Married  Other Topics Concern  . Not on file  Social History Narrative   Who lives with you: spouse, 2 story home, does not have problems with the stairs, has walk in shower, handrails.   Any pets: Dog, Brayden, black lab/boxer mix   Diet: Patient has a varied diet of protein, vegetables, starches.  Limits red meats.   Exercise: Patient exercises at least 3 times a week for over an hour.   Seatbelts: Patient reports wearing her seatbelt when in vehicle.    Nancy Fetter Exposure/Protection: Patient reports wearing sunscreen daily.   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 Biked 5 miles this morning to this appointment.     Family History:  The patient's \ family history includes Cancer in her brother; Glaucoma in her father and mother; Heart disease in her father and mother; Heart failure in her father; Peripheral vascular disease in her mother; Prostate cancer in her brother; Raynaud syndrome in her daughter, daughter, and mother. There is no history of Breast cancer or Colon cancer.  ROS:   Please see the history of present illness.  All other systems are reviewed and otherwise negative.    EKGs/Labs/Other Studies Reviewed:    Studies reviewed were summarized above.   EKG:  EKG is ordered today, personally reviewed, demonstrating NSR 83bpm, rightward axis, inferior ST-TW changes as well as V3-V6 stable compared to prior.  Recent Labs: 04/26/2019: ALT 15; BUN 16; Creatinine 0.89; Hemoglobin 10.6; Platelet Count 171; Potassium 3.9; Sodium 138  Recent Lipid Panel    Component Value Date/Time   CHOL 116 04/07/2018 1249   CHOL 121 12/31/2016 1543   TRIG 86 04/07/2018 1249   HDL 42 04/07/2018 1249   HDL 62 12/31/2016 1543   CHOLHDL 2.8 04/07/2018 1249   VLDL 17 04/07/2018 1249   LDLCALC 57 04/07/2018 1249   LDLCALC 39 12/31/2016 1543    PHYSICAL EXAM:    VS:  BP 110/64   Pulse 83   Ht 5' 6.5" (1.689 m)   Wt 115 lb 1.9 oz (52.2 kg)   SpO2 97%   BMI 18.30 kg/m   BMI: Body mass index is 18.3 kg/m.  GEN: Thin WF in no acute distress HEENT: normocephalic, atraumatic Neck: no JVD, carotid  bruits, or masses Cardiac: RRR; no murmurs, rubs, or gallops, no edema   Respiratory:  clear to auscultation bilaterally, normal work of breathing GI: soft, nontender, nondistended, + BS MS: significant kyphosis Skin: warm and dry, no rash Neuro:  Alert and Oriented x 3, Strength and sensation are intact, follows commands Psych: euthymic mood, full affect  Wt Readings from Last 3 Encounters:  04/27/19 115 lb 1.9 oz (52.2 kg)  04/26/19 115 lb (52.2 kg)  04/12/19 116 lb 1.6 oz (52.7 kg)     ASSESSMENT & PLAN:   1. CAD - asymptomatic, no recent angina. Continue ASA, statin and BB. Blood pressure well controlled. 2. Chronic combined CHF/cardiomyopathy - appears euvolemic. She is due for f/u echocardiogram today given ongoing chemotherapy treatment for MM. This is already scheduled for this morning. She again requests whether we can push it out longer than q3 months. Will await study before making further recommendation. Of note, she is not on ACEI/ARB/spiro due to history of softer BP. She is NYHA class I. 3. Hyperlipidemia - continue statin. Due for lipids. She ate this morning already. She already gets frequent bloodwork at the cancer center and raises question of whether she could just get it done with her routine labs. I have asked her to discuss with Dr. Burr Medico. If there is any issue obtaining it there, she will let us know and we can arrange for her to come back to our office to draw. 4. Multiple myeloma - followed closely in the cancer center. 5. 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  Disposition: F/u with Dr. Meda Rush in 6 months.  Medication Adjustments/Labs and Tests Ordered: Current medicines are reviewed at length with the patient today.  Concerns regarding medicines are outlined above. Medication changes, Labs and Tests ordered today are summarized above and listed in the Patient Instructions accessible in Encounters.   Signed, Charlie Pitter, PA-C  04/27/2019 10:43  AM    [address], [place], [place]  [address] Phone: [phone]; Fax: [phone]

## 2019-04-26 ENCOUNTER — Inpatient Hospital Stay: Payer: Medicare Other | Attending: Hematology

## 2019-04-26 ENCOUNTER — Other Ambulatory Visit: Payer: Self-pay

## 2019-04-26 ENCOUNTER — Encounter: Payer: Self-pay | Admitting: Hematology

## 2019-04-26 ENCOUNTER — Inpatient Hospital Stay: Payer: Medicare Other | Admitting: Hematology

## 2019-04-26 ENCOUNTER — Inpatient Hospital Stay: Payer: Medicare Other

## 2019-04-26 VITALS — BP 107/65 | HR 76 | Temp 98.0°F | Resp 18 | Ht 66.5 in | Wt 115.0 lb

## 2019-04-26 VITALS — BP 98/55 | HR 69 | Temp 97.6°F | Resp 18

## 2019-04-26 DIAGNOSIS — Z9221 Personal history of antineoplastic chemotherapy: Secondary | ICD-10-CM | POA: Insufficient documentation

## 2019-04-26 DIAGNOSIS — D61818 Other pancytopenia: Secondary | ICD-10-CM | POA: Insufficient documentation

## 2019-04-26 DIAGNOSIS — C9000 Multiple myeloma not having achieved remission: Secondary | ICD-10-CM

## 2019-04-26 DIAGNOSIS — M818 Other osteoporosis without current pathological fracture: Secondary | ICD-10-CM

## 2019-04-26 DIAGNOSIS — Z923 Personal history of irradiation: Secondary | ICD-10-CM | POA: Diagnosis not present

## 2019-04-26 DIAGNOSIS — I252 Old myocardial infarction: Secondary | ICD-10-CM | POA: Diagnosis not present

## 2019-04-26 DIAGNOSIS — T451X5A Adverse effect of antineoplastic and immunosuppressive drugs, initial encounter: Secondary | ICD-10-CM | POA: Diagnosis not present

## 2019-04-26 DIAGNOSIS — E785 Hyperlipidemia, unspecified: Secondary | ICD-10-CM | POA: Diagnosis not present

## 2019-04-26 DIAGNOSIS — Z79899 Other long term (current) drug therapy: Secondary | ICD-10-CM | POA: Diagnosis not present

## 2019-04-26 DIAGNOSIS — Z5112 Encounter for antineoplastic immunotherapy: Secondary | ICD-10-CM | POA: Diagnosis not present

## 2019-04-26 DIAGNOSIS — M810 Age-related osteoporosis without current pathological fracture: Secondary | ICD-10-CM | POA: Insufficient documentation

## 2019-04-26 DIAGNOSIS — G629 Polyneuropathy, unspecified: Secondary | ICD-10-CM | POA: Diagnosis not present

## 2019-04-26 DIAGNOSIS — D6481 Anemia due to antineoplastic chemotherapy: Secondary | ICD-10-CM | POA: Diagnosis not present

## 2019-04-26 DIAGNOSIS — Z7982 Long term (current) use of aspirin: Secondary | ICD-10-CM | POA: Insufficient documentation

## 2019-04-26 LAB — CBC WITH DIFFERENTIAL (CANCER CENTER ONLY)
Abs Immature Granulocytes: 0.01 10*3/uL (ref 0.00–0.07)
Basophils Absolute: 0 10*3/uL (ref 0.0–0.1)
Basophils Relative: 1 %
Eosinophils Absolute: 0.2 10*3/uL (ref 0.0–0.5)
Eosinophils Relative: 6 %
HCT: 31.3 % — ABNORMAL LOW (ref 36.0–46.0)
Hemoglobin: 10.6 g/dL — ABNORMAL LOW (ref 12.0–15.0)
Immature Granulocytes: 0 %
Lymphocytes Relative: 12 %
Lymphs Abs: 0.4 10*3/uL — ABNORMAL LOW (ref 0.7–4.0)
MCH: 36.2 pg — ABNORMAL HIGH (ref 26.0–34.0)
MCHC: 33.9 g/dL (ref 30.0–36.0)
MCV: 106.8 fL — ABNORMAL HIGH (ref 80.0–100.0)
Monocytes Absolute: 0.3 10*3/uL (ref 0.1–1.0)
Monocytes Relative: 9 %
Neutro Abs: 2.5 10*3/uL (ref 1.7–7.7)
Neutrophils Relative %: 72 %
Platelet Count: 171 10*3/uL (ref 150–400)
RBC: 2.93 MIL/uL — ABNORMAL LOW (ref 3.87–5.11)
RDW: 14.6 % (ref 11.5–15.5)
WBC Count: 3.5 10*3/uL — ABNORMAL LOW (ref 4.0–10.5)
nRBC: 0 % (ref 0.0–0.2)

## 2019-04-26 LAB — CMP (CANCER CENTER ONLY)
ALT: 15 U/L (ref 0–44)
AST: 19 U/L (ref 15–41)
Albumin: 3.7 g/dL (ref 3.5–5.0)
Alkaline Phosphatase: 43 U/L (ref 38–126)
Anion gap: 10 (ref 5–15)
BUN: 16 mg/dL (ref 8–23)
CO2: 22 mmol/L (ref 22–32)
Calcium: 8.9 mg/dL (ref 8.9–10.3)
Chloride: 106 mmol/L (ref 98–111)
Creatinine: 0.89 mg/dL (ref 0.44–1.00)
GFR, Est AFR Am: >60 mL/min (ref >60)
GFR, Estimated: >60 mL/min (ref >60)
Glucose, Bld: 95 mg/dL (ref 70–99)
Potassium: 3.9 mmol/L (ref 3.5–5.1)
Sodium: 138 mmol/L (ref 135–145)
Total Bilirubin: 0.7 mg/dL (ref 0.3–1.2)
Total Protein: 6.3 g/dL — ABNORMAL LOW (ref 6.5–8.1)

## 2019-04-26 MED ORDER — METHYLPREDNISOLONE SODIUM SUCC 125 MG IJ SOLR
INTRAMUSCULAR | Status: AC
  Filled 2019-04-26: qty 2

## 2019-04-26 MED ORDER — MONTELUKAST SODIUM 10 MG PO TABS
ORAL_TABLET | ORAL | Status: AC
  Filled 2019-04-26: qty 1

## 2019-04-26 MED ORDER — ACETAMINOPHEN 325 MG PO TABS
650.0000 mg | ORAL_TABLET | Freq: Once | ORAL | Status: AC
  Administered 2019-04-26: 650 mg via ORAL

## 2019-04-26 MED ORDER — ACETAMINOPHEN 325 MG PO TABS
ORAL_TABLET | ORAL | Status: AC
  Filled 2019-04-26: qty 2

## 2019-04-26 MED ORDER — DIPHENHYDRAMINE HCL 25 MG PO CAPS
ORAL_CAPSULE | ORAL | Status: AC
  Filled 2019-04-26: qty 2

## 2019-04-26 MED ORDER — SODIUM CHLORIDE 0.9 % IV SOLN
16.0000 mg/kg | Freq: Once | INTRAVENOUS | Status: AC
  Administered 2019-04-26: 900 mg via INTRAVENOUS
  Filled 2019-04-26: qty 5

## 2019-04-26 MED ORDER — SODIUM CHLORIDE 0.9 % IV SOLN
Freq: Once | INTRAVENOUS | Status: AC
  Administered 2019-04-26: 10:00:00 via INTRAVENOUS
  Filled 2019-04-26: qty 250

## 2019-04-26 MED ORDER — MONTELUKAST SODIUM 10 MG PO TABS
10.0000 mg | ORAL_TABLET | Freq: Once | ORAL | Status: AC
  Administered 2019-04-26: 10 mg via ORAL

## 2019-04-26 MED ORDER — METHYLPREDNISOLONE SODIUM SUCC 125 MG IJ SOLR
125.0000 mg | Freq: Once | INTRAMUSCULAR | Status: AC
  Administered 2019-04-26: 125 mg via INTRAVENOUS

## 2019-04-26 MED ORDER — DIPHENHYDRAMINE HCL 25 MG PO CAPS
50.0000 mg | ORAL_CAPSULE | Freq: Once | ORAL | Status: AC
  Administered 2019-04-26: 50 mg via ORAL

## 2019-04-26 NOTE — Progress Notes (Signed)
Faxed PET scan results, bone marrow biopsy, pathology, cytology reports to Dr. Gasparetto at [facility], fax [phone] 

## 2019-04-27 ENCOUNTER — Ambulatory Visit (INDEPENDENT_AMBULATORY_CARE_PROVIDER_SITE_OTHER): Payer: Medicare Other | Admitting: Physician Assistant

## 2019-04-27 ENCOUNTER — Telehealth: Payer: Self-pay | Admitting: *Deleted

## 2019-04-27 ENCOUNTER — Ambulatory Visit (HOSPITAL_COMMUNITY): Payer: Medicare Other | Attending: Cardiology

## 2019-04-27 ENCOUNTER — Telehealth: Payer: Self-pay | Admitting: Hematology

## 2019-04-27 ENCOUNTER — Encounter: Payer: Self-pay | Admitting: Physician Assistant

## 2019-04-27 VITALS — BP 110/64 | HR 83 | Ht 66.5 in | Wt 115.1 lb

## 2019-04-27 DIAGNOSIS — Z79899 Other long term (current) drug therapy: Secondary | ICD-10-CM | POA: Insufficient documentation

## 2019-04-27 DIAGNOSIS — C9000 Multiple myeloma not having achieved remission: Secondary | ICD-10-CM

## 2019-04-27 DIAGNOSIS — I5042 Chronic combined systolic (congestive) and diastolic (congestive) heart failure: Secondary | ICD-10-CM

## 2019-04-27 DIAGNOSIS — E785 Hyperlipidemia, unspecified: Secondary | ICD-10-CM | POA: Diagnosis not present

## 2019-04-27 DIAGNOSIS — I358 Other nonrheumatic aortic valve disorders: Secondary | ICD-10-CM | POA: Diagnosis not present

## 2019-04-27 DIAGNOSIS — I2510 Atherosclerotic heart disease of native coronary artery without angina pectoris: Secondary | ICD-10-CM

## 2019-04-27 DIAGNOSIS — I429 Cardiomyopathy, unspecified: Secondary | ICD-10-CM | POA: Diagnosis not present

## 2019-04-27 LAB — PROTEIN ELECTROPHORESIS, SERUM
A/G Ratio: 1.5 (ref 0.7–1.7)
Albumin ELP: 3.7 g/dL (ref 2.9–4.4)
Alpha-1-Globulin: 0.2 g/dL (ref 0.0–0.4)
Alpha-2-Globulin: 0.6 g/dL (ref 0.4–1.0)
Beta Globulin: 0.8 g/dL (ref 0.7–1.3)
Gamma Globulin: 0.8 g/dL (ref 0.4–1.8)
Globulin, Total: 2.4 g/dL (ref 2.2–3.9)
M-Spike, %: 0.6 g/dL — ABNORMAL HIGH
Total Protein ELP: 6.1 g/dL (ref 6.0–8.5)

## 2019-04-27 LAB — KAPPA/LAMBDA LIGHT CHAINS
Kappa free light chain: 42.8 mg/L — ABNORMAL HIGH (ref 3.3–19.4)
Kappa, lambda light chain ratio: 14.76 — ABNORMAL HIGH (ref 0.26–1.65)
Lambda free light chains: 2.9 mg/L — ABNORMAL LOW (ref 5.7–26.3)

## 2019-04-27 LAB — ECHOCARDIOGRAM COMPLETE
Height: 66.5 in
Weight: 1841.92 oz

## 2019-04-27 NOTE — Patient Instructions (Addendum)
Medication Instructions:  Your physician recommends that you continue on your current medications as directed. Please refer to the Current Medication list given to you today.  If you need a refill on your cardiac medications before your next appointment, please call your pharmacy.   Lab work: None ordered  If you have labs (blood work) drawn today and your tests are completely normal, you will receive your results only by: Marland Kitchen MyChart Message (if you have MyChart) OR . A paper copy in the mail If you have any lab test that is abnormal or we need to change your treatment, we will call you to review the results.  Testing/Procedures: None ordered  Follow-Up: At [facility], you and your health needs are our priority.  As part of our continuing mission to provide you with exceptional heart care, we have created designated Provider Care Teams.  These Care Teams include your primary Cardiologist (physician) and Advanced Practice Providers (APPs -  Physician Assistants and Nurse Practitioners) who all work together to provide you with the care you need, when you need it. You will need a follow up appointment in 6 months.  Please call our office 2 months in advance to schedule this appointment.  You may see Ena Dawley, MD or one of the following Advanced Practice Providers on your designated Care Team:   [place], PA-C Melina Copa, PA-C . Ermalinda Barrios, PA-C  Any Other Special Instructions Will Be Listed Below (If Applicable).     Please talk to Dr. Burr Medico about having your cholesterol checked with your next labwork.

## 2019-04-27 NOTE — Telephone Encounter (Signed)
No los per 10/12. °

## 2019-04-27 NOTE — Telephone Encounter (Signed)
-----   Message from Charlie Pitter, [place] sent at 04/27/2019  2:08 PM EDT ----- Please let Shannon Rush known good news. Her measurements of LV function and strain have improved. Her EF is now normal!! This is great news. There are still nonspecific findings noted such as impaired relaxation and moderate atrial dilation but nothing to do acutely about these - and do not appear to be causing any issues for her presently.   Shannon. Rush has really been asking if we could extend out her echo duration longer than every 3 months. Given this echo report, Dr. Meda Coffee feels she is now OK to now transition to echoes every 6 months instead of every 3 months. Celebrate! Let's go ahead and get her echocardiogram about a week prior to next appointment in 6 months  Dayna Dunn PA-C

## 2019-04-30 ENCOUNTER — Encounter: Payer: Self-pay | Admitting: Hematology

## 2019-04-30 ENCOUNTER — Other Ambulatory Visit: Payer: Self-pay | Admitting: Hematology

## 2019-04-30 LAB — IFE, DARA-SPECIFIC, SERUM
IgA: 5 mg/dL — ABNORMAL LOW (ref 64–422)
IgG (Immunoglobin G), Serum: 1029 mg/dL (ref 586–1602)
IgM (Immunoglobulin M), Srm: 14 mg/dL — ABNORMAL LOW (ref 26–217)

## 2019-05-03 ENCOUNTER — Other Ambulatory Visit: Payer: Self-pay

## 2019-05-03 DIAGNOSIS — Z13220 Encounter for screening for lipoid disorders: Secondary | ICD-10-CM

## 2019-05-04 ENCOUNTER — Telehealth: Payer: Self-pay

## 2019-05-04 NOTE — Telephone Encounter (Signed)
Patient left voicemail clarifying what new medications Dr. Kendell Bane NP recommended: Kyprolis, Cytoxan, and dexamethasone. Will forward message to Dr. Burr Medico and let her know patient would like her to call Dr. [name] directly to discuss treatment course.

## 2019-05-05 ENCOUNTER — Telehealth: Payer: Self-pay

## 2019-05-05 NOTE — Telephone Encounter (Signed)
Patient calls stating she thinks that Dr. Samule Ohm recommended the [place], Cytoxan and Dexamethasone prior to seeing her bone marrow biopsy results.  [phone]

## 2019-05-05 NOTE — Telephone Encounter (Signed)
I sent a text message to Dr. Samule Ohm, waiting her to call me back.   [name] MD

## 2019-05-09 ENCOUNTER — Other Ambulatory Visit: Payer: Self-pay | Admitting: Cardiology

## 2019-05-09 NOTE — Progress Notes (Signed)
[place]   [phone]) [phone] Fax:([phone]) [phone]   Clinic Follow up Note   Patient Care Team: [name], MD as PCP - General (Family Medicine) [name], MD as PCP - Cardiology (Cardiology) [name], MD as Attending Physician (Obstetrics and Gynecology) [name], MD as Consulting Physician (Internal Medicine) [name], MD as Consulting Physician (Hematology) [name], MD as Consulting Physician (Ophthalmology) [name], MD as Consulting Physician (Cardiology) [name], MD as Consulting Physician (Dermatology) [name] (Dentistry) [name], DPM as Consulting Physician (Podiatry) Pleasant, Eppie Gibson, RN as [facility] 05/10/2019  CHIEF COMPLAINT: F/u MM  SUMMARY OF ONCOLOGIC HISTORY: Oncology History Overview Note  Multiple myeloma ([place])   Staging form: Multiple Myeloma, AJCC 6th Edition   - Clinical: Stage IIIA - Signed by [name], MD on 09/04/2013    Multiple myeloma ([place])  05/26/2012 Initial Diagnosis   Presenting IgG was 4,040 mg/dL on 06/05/2012 (IgA 35; IgM 34); kappa free light chain 34.4 mg/dL, lambda 0.00, kappa:lambda ratio of 34.75; SPEP with M-spike of 2.60.   06/30/2012 Imaging   Numerous lytic lesions throughout the calvarium.  Lytic lesion within the left lateral scapula.  Findings most compatible with metastases or myeloma. Multiple mid and lower thoracic compression fractures.  Slight compression through the endplates at L3.   23/76/2831 Bone Marrow Biopsy   Bone marrow biopsy showed 49% plasma cell. Normal classical cytogenetics; however, myeloma FISH panel showed 13q- (intermediate risk)       07/14/2012 - 07/27/2012 Radiation Therapy   Palliative radiation 20 Gy over 10 fractions between to thoracic spine cord compression and symptomatic left scapula lytic lesion.   08/10/2012 - 11/2012 Chemotherapy   Started SQ Velcade once  weekly, 3 weeks on, 1 weeks off; daily Revlimid d1-21, 7 days off; and Dexamethasone 6m PO weekly.    02/12/2013 Bone Marrow Transplant   [facility]    06/14/2013 Tumor Marker   IgG  1830,  Kappa:lamba ratio, 1.69 (baseline was 34.75 or   M-spike 0.28 (baseline of 2.6 or  89.3% of baseline).     06/25/2013 -  Chemotherapy   Started zometa 3.5 mg monthly    09/01/2013 - 01/2014 Chemotherapy   Maintenance therapy with revlimid 553mdaily for 14 days and then off for 7 (decreased from 21 days on and 7 days off based on neutropenia). Stopped due to disease progression 01/2017   09/13/2013 - 09/17/2014 Hospital Admission   Hospital admission for pneumonia   12/20/2013 Treatment Plan Change   Maintenance therapy decreased to 2.5 mg daily for 21 days and then off for 7 days based on low counts.    01/25/2014 Tumor Marker   IgG 1360 M spike 0.5   03/01/2014 - 03/05/2014 Hospital Admission   [facility], received thrombolytic therapy, cath showed 60% prox RCA, mid-distal RCA 50%, Echo normal, no stents.   04/11/2014 - 08/07/2014 Chemotherapy   Continue Revlimid at 2.5 mg 3 weeks on 1 week off   08/08/2014 - 08/13/2014 Hospital Admission   Hospital admission for pneumonia. Her Revlimid was held.   08/29/2014 - 09/14/2014 Chemotherapy   Maintenance Revlimid restarted   09/14/2014 - 09/17/2014 Hospital Admission   [place].   11/13/2014 - 12/25/2015 Chemotherapy   Maintenance Revlimid restarted, changed to 2.42m74maily, 2 weks on,  1 week off from 12/15/2014, stopped due to disease progression    01/24/2016 Progression   Patient is M protein has gradually increased to 1.0g, repeated a bone marrow biopsy showed plasma cell 10-30%   02/27/2016 - 03/07/2016 Chemotherapy   Pomalidomide 4 mg daily on day 1-21, dexamethasone 40 mg on day 1, 8 and 15, every 28 days,  started on 02/27/2016, daratumumab weekly started on 03/07/2016, held after first dose, due to hospitalization and a severe pancytopenia, pomalidomide was stopped afterwards    03/10/2016 - 03/29/2016 Hospital Admission   Pt was admitted for sepsis from pneumonia, and severe pancytopenia. She required ICU stay for a few days due to hypotension, developed b/l pleural effusion required diuretics and b/l thoracentesis. She also required blood and plt transfusion, and prolonged neupogen injection for severe neutropenia. She was discharged home after 3 weeks hospital stay    04/26/2016 -  Chemotherapy   Daratumumab restarted on 04/26/2016, Velcade 1.5m/m2 and dexa 232mweekly start on 11/3, velcade held since 06/28/2016 due to CHF, dexa stopped on12/11/2017 due to high risk of fracture.  Dara reduced to every 4 weeks due to COVID-19 starting 10/23/18. Returned to every 3 weeks starting 12/17/18. Restarted every 2 weeks starting 02/22/19   08/26/2016 Echocardiogram   - Left ventricle: LVEF is approximately 35% with diffuse diffuse   hypokinesis with severe hypokinesis/ akinesis of the   inferior/inferoseptal walls. In comparison to echo images from   December 2017, there does not appear to be significant change The   cavity size was normal. Wall thickness was normal. Doppler   parameters are consistent with abnormal left ventricular   relaxation (grade 1 diastolic dysfunction). - Aortic valve: AV is thickened. 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 Present in echo from December 2017 There was   trivial regurgitation. - Right ventricle: Systolic function was mildly reduced. - Right atrium: The atrium was mildly dilated. - Pericardium, extracardiac: A trivial pericardial effusion was   identified.   12/03/2016 Echocardiogram   -Left Ventricle: Systolic function was   mildly to moderately reduced. The estimated ejection fraction was   in  the range of 40% to 45%. Diffuse hypokinesis. Doppler   parameters are consistent with abnormal left ventricular   relaxation (grade 1 diastolic dysfunction). Doppler parameters   are consistent with indeterminate ventricular filling pressure. - Left atrium: The atrium was severely dilated. - Tricuspid valve: There was trivial regurgitation. - Pulmonary arteries: Systolic pressure was within the normal   range. PA peak pressure: 33 mm Hg (S). - Global longitudinal strain -10.5% (abnormal)   03/12/2017 Echocardiogram   ECHO 03/12/17 Study Conclusions - Left ventricle: The cavity size was normal. Wall thickness was   increased in a pattern of mild LVH. Systolic function was normal.   The estimated ejection fraction was in the range of 50% to 55%.   There is hypokinesis of the basalinferior myocardium. Doppler   parameters are consistent with abnormal left ventricular   relaxation (grade 1 diastolic dysfunction). - Aortic valve: There was trivial regurgitation. - Mitral valve: Calcified annulus. Mildly thickened leaflets .   There was trivial regurgitation. - Tricuspid valve: There was mild regurgitation. Impressions: - There has been mild improvement in EF since prior study.    12/12/2017 - 12/15/2017 Hospital Admission   Admission diagnosis: Pnuemonia and acute hypoxic respiratory failure Additional comments: treated for pneumoania and acute hypoxic respiratory failure before she was discharged on 12/15/17  with oral anitbiotics. She did not require home oxygen.    12/30/2017 - 01/02/2018 Hospital Admission   Admission diagnosis: Pnuemonia  Additional comments: She was again hospitalized on 12/30/17 for pneumonia, ID work up was otherwise negative, she was discharged home with oral antibiotics.    04/01/2018 Imaging   04/01/2018 CXR IMPRESSION: Persisting airspace disease superimposed on chronic changes, suggesting pneumonia given the history. Followup PA and lateral chest X-ray is  recommended in 3-4 weeks following therapy to ensure resolution and exclude underlying malignancy.  Redemonstration of mid and lower thoracic compression fractures with associated thoracic kyphosis.   04/23/2018 Echocardiogram   04/23/2018 ECHO LV EF: 45% -   50%   01/07/2019 Imaging   MRI Brain 01/07/19 IMPRESSION: MRI HEAD IMPRESSION:   1. 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 Lesion closely approximates the left inner ear structures and involves the left Meckel's cave and left fifth cranial nerve, and most likely is the causative etiology for patient's underlying left ear and facial symptoms. Follow-up examination with postcontrast imaging suggested for complete evaluation. Inclusion of IAC sequences would likely be helpful for visualization. 2. Additional 2.7 cm lesion involving the right occipital calvarium with adjacent marrow signal abnormality, also likely related to multiple myeloma. Metastatic disease would be the primary differential consideration. 3. Underlying age-appropriate cerebral atrophy with mild chronic microvascular ischemic disease. No other acute intracranial abnormality.   MRA HEAD IMPRESSION:   1. Negative intracranial MRA with no large vessel occlusion, hemodynamically significant stenosis, or other acute vascular abnormality. 2. Petrous and cavernous left ICA is partially encased and surrounded by the left petrous apex lesion without significant irregularity or stenosis. 3. 2 mm right paraophthalmic aneurysm.   MRA NECK IMPRESSION:   Normal MRA of the neck with wide patency of the carotid and vertebral arteries bilaterally. No hemodynamically significant stenosis or other acute vascular abnormality identified.    01/14/2019 - 01/27/2019 Radiation Therapy   Radaition to Left petrous apex bone lesion  01/14/19-01/27/19 with Dr. Tammi Klippel.    04/01/2019 Pathology Results   Bone Marrow biopsy 04/01/19  DIAGNOSIS: BONE MARROW, ASPIRATE, CLOT, CORE: - Normocellular marrow with minimal involvement by plasma cell neoplasm - See comment PERIPHERAL BLOOD: - Macrocytic anemia and leukopenia -See complete blood count   04/09/2019 PET scan   PET 04/09/19  IMPRESSION: 1. Scattered lytic lesions of bone. A few of these demonstrate hypermetabolic activity, most notably the right occipital bone lesion; the lytic lesion in the approximately C7 spinous process; the lytic expansile lesion of the left fourth rib anteriorly; and a small lytic focus in the right femoral head. Other scattered lytic lesions have only low-grade activity. 2. Multiple fractures as detailed above. 3. Other imaging findings of potential clinical significance: Aortic Atherosclerosis (ICD10-I70.0). Coronary atherosclerosis.     CURRENT THERAPY: Daratumumabevery 3 weeks. Dexamethasone a day before treatment.Dexa stopped in 07/2018 due to well controlled MM and high risk of fracture. Dara reduced to every 4 weeks due to COVID-19 starting 10/23/18.Returned to every 3 weeks starting 12/17/18. Restarted every 2 weeks starting 02/22/19. Adding Velcade on 05/10/19   INTERVAL HISTORY: Ms. Marcel returns for f/u and treatment as scheduled. She completed last cycle daratumumab on 04/26/19. She is doing OK. Not very hungry lately. She is fatigued but able to continue riding bike and active. Mild nausea without vomiting is stable, she does not require medication. Bowels are normal  for her. Neuropathy in feet worse than hands is stable. She staggers and feels off balance which she attributes to inner ear symptoms rather than neuropathy. She has mild occipital pain and low back pain. She can usually find relief with repositioning. Pain is only briefly up to 6 or 7/10. She takes 1/2 tab tylenol rarely which is sufficient. Otherwise, she denies fever,  chills, cough, chest pain, dyspnea, or leg swelling. Left low leg skin wound is healing.    MEDICAL HISTORY:  Past Medical History:  Diagnosis Date  . CAD (coronary artery disease)    a. inf STEMI (in [place] >>> [facility]) >>> LHC (8/15- [place]):  pRCA 60%, mid to dist RCA 50% >>> med rx  . Cardiomyopathy ([place])   . Cataract   . Chronic combined systolic and diastolic CHF (congestive heart failure) ([place])   . Cord compression ([place]) 07/13/12   MRI- diffuse myeloma involvement of T-L spine  . History of radiation therapy 07/13/12-07/27/12   spinal cord compression T3-T10,left scapula  . 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 Multiple myeloma ([place]) 07/01/2012  . MVP (mitral valve prolapse)    a. trivial by echo 06/2017.  . OSTEOPOROSIS 06/11/2010   Multiple compression fractures; and spontaneous fracture of sternum Qualifier: Diagnosis of  By: [name], [name]     . Thoracic kyphosis 07/13/12   per MRI scan  . Unspecified deficiency anemia     SURGICAL HISTORY: Past Surgical History:  Procedure Laterality Date  . APPENDECTOMY    . CATARACT EXTRACTION, BILATERAL    . CESAREAN SECTION     x2   . COLONOSCOPY  2007   neg with Dr. [name]  . ELBOW SURGERY    . HIP SURGERY  2009   left  . TUBAL LIGATION      I have reviewed the social history and family history with the patient and they are unchanged from previous note.  ALLERGIES:  is allergic to zithromax [azithromycin]; zosyn [piperacillin sod-tazobactam so]; and quinolones.  MEDICATIONS:  Current Outpatient Medications  Medication Sig Dispense Refill  . acetaminophen (TYLENOL) 500 MG tablet Take 500 mg by mouth every 6 (six) hours as needed for moderate pain or fever.    Marland Kitchen acyclovir (ZOVIRAX) 400 MG tablet Take 1 tablet (400 mg total) by mouth 2 (two) times daily. 180 tablet 3  . aspirin EC 81 MG tablet Take 1 tablet (81 mg total) by mouth daily. 90 tablet 3  . atorvastatin (LIPITOR) 20 MG tablet Take 1  tablet (20 mg total) by mouth daily. 10 tablet 0  . Calcium Carbonate-Vit D-Min (CALCIUM 600+D PLUS MINERALS) 600-400 MG-UNIT TABS Take by mouth.    . doxycycline (VIBRA-TABS) 100 MG tablet Take 100 mg by mouth as needed.    . metoprolol succinate (TOPROL-XL) 25 MG 24 hr tablet Take 1 tablet (25 mg total) by mouth daily. 10 tablet 0   No current facility-administered medications for this visit.     PHYSICAL EXAMINATION: ECOG PERFORMANCE STATUS: 1 - Symptomatic but completely ambulatory  Vitals:   05/10/19 0945  BP: 114/76  Pulse: 80  Resp: 18  Temp: 98.7 F (37.1 C)  SpO2: 100%   Filed Weights   05/10/19 0945  Weight: 114 lb 4.8 oz (51.8 kg)    GENERAL:alert, no distress and comfortable SKIN: no rash  EYES:  sclera clear LUNGS: clear with normal breathing effort HEART: regular rate & rhythm, no lower extremity edema ABDOMEN:abdomen soft, non-tender and  normal bowel sounds Musculoskeletal: diffuse spinal tenderness  NEURO: alert & oriented x 3 with fluent speech, decreased vibratory sense in feet up to ankle per tuning fork exam  LABORATORY DATA:  I have reviewed the data as listed CBC Latest Ref Rng & Units 05/10/2019 04/26/2019 04/12/2019  WBC 4.0 - 10.5 K/uL 3.5(L) 3.5(L) 3.7(L)  Hemoglobin 12.0 - 15.0 g/dL 9.1(L) 10.6(L) 10.5(L)  Hematocrit 36.0 - 46.0 % 27.4(L) 31.3(L) 31.8(L)  Platelets 150 - 400 K/uL 133(L) 171 131(L)     CMP Latest Ref Rng & Units 05/10/2019 04/26/2019 04/12/2019  Glucose 70 - 99 mg/dL 88 95 91  BUN 8 - 23 mg/dL _0 Creatinine 0.44 - 1.00 mg/dL 0.83 0.89 0.81  Sodium 135 - 145 mmol/L 139 138 137  Potassium 3.5 - 5.1 mmol/L 3.7 3.9 4.0  Chloride 98 - 111 mmol/L 105 106 106  CO2 22 - 32 mmol/L _1 Calcium 8.9 - 10.3 mg/dL 9.2 8.9 8.8(L)  Total Protein 6.5 - 8.1 g/dL 6.5 6.3(L) 6.0(L)  Total Bilirubin 0.3 - 1.2 mg/dL 0.7 0.7 0.6  Alkaline Phos 38 - 126 U/L 45 43 46  AST 15 - 41 U/L _2 ALT 0 - 44 U/L _3 RADIOGRAPHIC STUDIES: I have personally reviewed the radiological images as listed and agreed with the findings in the report. No results found.   ASSESSMENT & PLAN: Shannon Rush a 78 y.o.femalewith    1. IgG kappa Multiple myeloma s/p Auto SCT, intermediate risk, relapsed in 01/2016 -Diagnosed in 05/2012. Treated with induction chemo, radiation and BM transplant, but unfortunately progressed while on maintenance Revlimid 01/2016. She is currently onDaratumumabmonotherapy   -Dexa was stoppeddue to her high risk of fracture, and well controlled MM -During COVID19 treatment changed to every 4 weeks, and changed back to every 3 weeks in early June -Due to her slight disease progression and symptomatic bone lesion in left petrous apex, Dr. Burr Medico changed Dara back to q2 weeks. -Due to recent increase in M protein from 0.3 to 0.6, she underwent restaging bone marrow biopsy in 03/2019 which shows normocellular marrow and minimal involvement by plasma cell neoplasm (2%) -She also underwent PET scan in 03/2019 which shows scattered lytic lesions, some of which show hypermetabolic activity. She is symptomatic of the occipital lesion. She also has low back pain without obvious PET/CT correlate.  -Dr. Burr Medico previously reviewed these results with Ms Leamon Rush and sent records to Dr[facility] Samule Ohm at [facility] to discuss treatment plan.  -I reached out to her team at [facility], her primary NP was out of the office today and final treatment recommendation has not been clarified.  -Dr. Burr Medico recommends to add Velcade and Dex and continue daratumumab. Potential side effects including cytopenias, risk of infection, constipation, neuropathy were discussed. The patient agrees to proceed.  -Labs are stable, adequate for treatment  -Proceed with daratumumab and begin Velcade injection today, continue q2 weeks -f/u with next treatment  2. Left petrous apex bone lesion -Her inner ear issues and dizziness progressed and she  developedmildbalance issueand nausea  -Her MRI brain from 01/07/19 showedabnormal osseous lesion measuring up to approximately 5.9 cm centered at the left petrous apex. Thismost likely is the cause of her underlyingleft ear and balancesymptoms. -Given she is symptomatic, sheunderwent target RT with Dr Tammi Klippel 7/1-7/15. -She has stable muffled hearing and mild imbalanceissue, no significant improvement after RT  3. History of multiple pneumonia andbronchitis -  She has had multiple episodes of pneumonia and bronchitis, required hospitalization and antibiotics -Her vaccines are up-to-date.  4. Pancytopenia -Secondary to Dara -stable  5. CHF, CAD, inferior STEMI in 02/2014, HLD -She is on ASA, BB, statin -f/u with Dr. Meda Coffee and continue medications  6. Osteoporosis with multiple fractures -Last DEXA in 2015 showed worsening osteoporosis. She declined repeat DEXA. -She had a fall induced fracture in 01/2018. -She was on Zometa for 4 years, stopped after April 2018  7. Peripheral neuropathy in feet, G1-2 -Secondary to previous chemo. -worse in feet than hands -tuning fork exam shows decreased vibratory sense up to her ankle. Will monitor for worsening neuropathy on chemo.  -she declined gabapentin today    8. Left posterior calf wound -she sustained a wound in early August, is has not healed quickly. She applied topical antibiotic ointment for a few weeks - will treat with Doxycycline 100 mg BID x1 week.  -healing slowly, improved   9. Goal of care discussion  -full code  PLAN: -Labs reviewed -Proceed with daratumumab and velcade today -I reached out to Dr. Kendell Bane team to clarify her treatment recommendations  -Reviewed medication management for neuropathy and decreased appetite, patient declined  -Will ask Dr. Tammi Klippel to review PET scan to see if she is a candidate for palliative radiation  -Lab, f/u and treatment in 2 weeks  -Lipid panel drawn per  patient request   All questions were answered. The patient knows to call the clinic with any problems, questions or concerns. No barriers to learning was detected. I spent 20 minutes counseling the patient face to face. The total time spent in the appointment was 25 minutes and more than 50% was on counseling and review of test results     Alla Feeling, NP 05/10/19

## 2019-05-10 ENCOUNTER — Encounter: Payer: Self-pay | Admitting: Nurse Practitioner

## 2019-05-10 ENCOUNTER — Inpatient Hospital Stay (HOSPITAL_BASED_OUTPATIENT_CLINIC_OR_DEPARTMENT_OTHER): Payer: Medicare Other | Admitting: Nurse Practitioner

## 2019-05-10 ENCOUNTER — Telehealth: Payer: Self-pay | Admitting: Nurse Practitioner

## 2019-05-10 ENCOUNTER — Inpatient Hospital Stay: Payer: Medicare Other

## 2019-05-10 ENCOUNTER — Other Ambulatory Visit: Payer: Self-pay

## 2019-05-10 VITALS — BP 91/57 | HR 68 | Temp 97.8°F | Resp 17

## 2019-05-10 VITALS — BP 114/76 | HR 80 | Temp 98.7°F | Resp 18 | Ht 66.5 in | Wt 114.3 lb

## 2019-05-10 DIAGNOSIS — Z13220 Encounter for screening for lipoid disorders: Secondary | ICD-10-CM

## 2019-05-10 DIAGNOSIS — D61818 Other pancytopenia: Secondary | ICD-10-CM | POA: Diagnosis not present

## 2019-05-10 DIAGNOSIS — I252 Old myocardial infarction: Secondary | ICD-10-CM | POA: Diagnosis not present

## 2019-05-10 DIAGNOSIS — C9000 Multiple myeloma not having achieved remission: Secondary | ICD-10-CM

## 2019-05-10 DIAGNOSIS — G629 Polyneuropathy, unspecified: Secondary | ICD-10-CM | POA: Diagnosis not present

## 2019-05-10 DIAGNOSIS — Z9221 Personal history of antineoplastic chemotherapy: Secondary | ICD-10-CM | POA: Diagnosis not present

## 2019-05-10 DIAGNOSIS — M810 Age-related osteoporosis without current pathological fracture: Secondary | ICD-10-CM | POA: Diagnosis not present

## 2019-05-10 DIAGNOSIS — Z5112 Encounter for antineoplastic immunotherapy: Secondary | ICD-10-CM | POA: Diagnosis not present

## 2019-05-10 DIAGNOSIS — E785 Hyperlipidemia, unspecified: Secondary | ICD-10-CM | POA: Diagnosis not present

## 2019-05-10 DIAGNOSIS — Z7982 Long term (current) use of aspirin: Secondary | ICD-10-CM | POA: Diagnosis not present

## 2019-05-10 DIAGNOSIS — Z79899 Other long term (current) drug therapy: Secondary | ICD-10-CM | POA: Diagnosis not present

## 2019-05-10 DIAGNOSIS — Z923 Personal history of irradiation: Secondary | ICD-10-CM | POA: Diagnosis not present

## 2019-05-10 LAB — CMP (CANCER CENTER ONLY)
ALT: 15 U/L (ref 0–44)
AST: 19 U/L (ref 15–41)
Albumin: 3.8 g/dL (ref 3.5–5.0)
Alkaline Phosphatase: 45 U/L (ref 38–126)
Anion gap: 11 (ref 5–15)
BUN: 15 mg/dL (ref 8–23)
CO2: 23 mmol/L (ref 22–32)
Calcium: 9.2 mg/dL (ref 8.9–10.3)
Chloride: 105 mmol/L (ref 98–111)
Creatinine: 0.83 mg/dL (ref 0.44–1.00)
GFR, Est AFR Am: >60 mL/min (ref >60)
GFR, Estimated: >60 mL/min (ref >60)
Glucose, Bld: 88 mg/dL (ref 70–99)
Potassium: 3.7 mmol/L (ref 3.5–5.1)
Sodium: 139 mmol/L (ref 135–145)
Total Bilirubin: 0.7 mg/dL (ref 0.3–1.2)
Total Protein: 6.5 g/dL (ref 6.5–8.1)

## 2019-05-10 LAB — CBC WITH DIFFERENTIAL (CANCER CENTER ONLY)
Abs Immature Granulocytes: 0.03 10*3/uL (ref 0.00–0.07)
Basophils Absolute: 0 10*3/uL (ref 0.0–0.1)
Basophils Relative: 1 %
Eosinophils Absolute: 0.2 10*3/uL (ref 0.0–0.5)
Eosinophils Relative: 5 %
HCT: 27.4 % — ABNORMAL LOW (ref 36.0–46.0)
Hemoglobin: 9.1 g/dL — ABNORMAL LOW (ref 12.0–15.0)
Immature Granulocytes: 1 %
Lymphocytes Relative: 11 %
Lymphs Abs: 0.4 10*3/uL — ABNORMAL LOW (ref 0.7–4.0)
MCH: 35.8 pg — ABNORMAL HIGH (ref 26.0–34.0)
MCHC: 33.2 g/dL (ref 30.0–36.0)
MCV: 107.9 fL — ABNORMAL HIGH (ref 80.0–100.0)
Monocytes Absolute: 0.3 10*3/uL (ref 0.1–1.0)
Monocytes Relative: 9 %
Neutro Abs: 2.5 10*3/uL (ref 1.7–7.7)
Neutrophils Relative %: 73 %
Platelet Count: 133 10*3/uL — ABNORMAL LOW (ref 150–400)
RBC: 2.54 MIL/uL — ABNORMAL LOW (ref 3.87–5.11)
RDW: 14.7 % (ref 11.5–15.5)
WBC Count: 3.5 10*3/uL — ABNORMAL LOW (ref 4.0–10.5)
nRBC: 0 % (ref 0.0–0.2)

## 2019-05-10 LAB — LIPID PANEL
Cholesterol: 130 mg/dL (ref 0–200)
HDL: 59 mg/dL (ref >40)
LDL Cholesterol: 62 mg/dL (ref 0–99)
Total CHOL/HDL Ratio: 2.2 RATIO
Triglycerides: 44 mg/dL (ref <150)
VLDL: 9 mg/dL (ref 0–40)

## 2019-05-10 MED ORDER — DARATUMUMAB-HYALURONIDASE-FIHJ 1800-30000 MG-UT/15ML ~~LOC~~ SOLN
1800.0000 mg | Freq: Once | SUBCUTANEOUS | Status: AC
  Administered 2019-05-10: 1800 mg via SUBCUTANEOUS
  Filled 2019-05-10: qty 15

## 2019-05-10 MED ORDER — DIPHENHYDRAMINE HCL 25 MG PO CAPS
50.0000 mg | ORAL_CAPSULE | Freq: Once | ORAL | Status: AC
  Administered 2019-05-10: 50 mg via ORAL

## 2019-05-10 MED ORDER — MONTELUKAST SODIUM 10 MG PO TABS
10.0000 mg | ORAL_TABLET | Freq: Once | ORAL | Status: AC
  Administered 2019-05-10: 10 mg via ORAL

## 2019-05-10 MED ORDER — DEXAMETHASONE 4 MG PO TABS
20.0000 mg | ORAL_TABLET | Freq: Once | ORAL | Status: AC
  Administered 2019-05-10: 20 mg via ORAL

## 2019-05-10 MED ORDER — MONTELUKAST SODIUM 10 MG PO TABS
ORAL_TABLET | ORAL | Status: AC
  Filled 2019-05-10: qty 1

## 2019-05-10 MED ORDER — BORTEZOMIB CHEMO SQ INJECTION 3.5 MG (2.5MG/ML)
1.3000 mg/m2 | Freq: Once | INTRAMUSCULAR | Status: AC
  Administered 2019-05-10: 2 mg via SUBCUTANEOUS
  Filled 2019-05-10: qty 0.8

## 2019-05-10 MED ORDER — DIPHENHYDRAMINE HCL 25 MG PO CAPS
ORAL_CAPSULE | ORAL | Status: AC
  Filled 2019-05-10: qty 2

## 2019-05-10 MED ORDER — ACETAMINOPHEN 325 MG PO TABS
650.0000 mg | ORAL_TABLET | Freq: Once | ORAL | Status: AC
  Administered 2019-05-10: 650 mg via ORAL

## 2019-05-10 MED ORDER — DEXAMETHASONE 4 MG PO TABS
ORAL_TABLET | ORAL | Status: AC
  Filled 2019-05-10: qty 2

## 2019-05-10 MED ORDER — ACETAMINOPHEN 325 MG PO TABS
ORAL_TABLET | ORAL | Status: AC
  Filled 2019-05-10: qty 2

## 2019-05-10 MED ORDER — DEXAMETHASONE 4 MG PO TABS
ORAL_TABLET | ORAL | Status: AC
  Filled 2019-05-10: qty 3

## 2019-05-10 NOTE — Telephone Encounter (Signed)
Scheduled appt per 10/26 los.  Patient will get an updated calendar in treatment,

## 2019-05-13 ENCOUNTER — Other Ambulatory Visit: Payer: Self-pay | Admitting: Hematology

## 2019-05-13 ENCOUNTER — Other Ambulatory Visit: Payer: Self-pay | Admitting: Cardiology

## 2019-05-13 MED ORDER — METOPROLOL SUCCINATE ER 25 MG PO TB24: 25.0000 mg | ORAL_TABLET | Freq: Every day | ORAL | 0 refills | Status: DC

## 2019-05-13 NOTE — Telephone Encounter (Signed)
Pt's medication was sent to pt's pharmacy as requested. Pt our of town requesting a 15 day supply. Confirmation received.

## 2019-05-13 NOTE — Progress Notes (Signed)
START ON PATHWAY REGIMEN - Multiple Myeloma and Other Plasma Cell Dyscrasias     A cycle is every 28 days:     Cyclophosphamide      Dexamethasone      Bortezomib   **Always confirm dose/schedule in your pharmacy ordering system**  Patient Characteristics: Relapsed / Refractory, Second through Fourth Lines of Therapy R-ISS Staging: Unknown Disease Classification: Refractory Line of Therapy: Fourth Line Intent of Therapy: Non-Curative / Palliative Intent, Discussed with Patient

## 2019-05-16 NOTE — Telephone Encounter (Signed)
I called pt twice on 10/29, and left her a message to call back.   Malachy Mood, please call her tomorrow and let her call me back to discuss her next line treatment.   [name] MD

## 2019-05-17 NOTE — Progress Notes (Signed)
Histology and Location of Primary Cancer: Stage IIIA Multiple myeloma diagnosed 09/04/2013  Sites of Visceral and Bony Metastatic Disease: scattered lytic lesions  Location(s) of Symptomatic Metastases: Scattered lytic lesions of bone. A few of these demonstrate hypermetabolic activity, most notably the right occipital bone lesion; the lytic lesion in the approximately C7 spinous process;the lytic expansile lesion of the left fourth rib anteriorly; and a small lytic focus in the right femoral head.  Past/Anticipated chemotherapy by medical oncology, if any:  CURRENT THERAPY: Daratumumabevery 3 weeks. Dexamethasone a day before treatment.Dexa stopped in 07/2018 due to well controlled MM and high risk of fracture. Dara reduced to every 4 weeks due to COVID-19 starting 10/23/18.Returned to every 3 weeks starting 12/17/18. Restarted every 2 weeks starting 02/22/19. Adding Velcade on 05/10/19   Pain on a scale of 0-10 is: Reports mild occipital pain and low back pain. She can usually find relief with repositioning. Pain is only briefly up to 6 or 7 out of 10. She take 1/2 tylenol rarely which is sufficient.  Patient states, I have peripheral neuropathy in my feet, my neck and entire back are sore, both of my shoulders are sore and my left knee is painful because its bad.    If Spine Met(s), symptoms, if any, include:  Bowel/Bladder retention or incontinence (please describe): Bowels are normal. Denies any bladder complaints.  Numbness or weakness in extremities (please describe): Neuropathy in feet worse than hands is stable. She staggers and feels off balance. She denies falling. She reports fatigue but able to continue riding bike and stay active.   Current Decadron regimen, if applicable: no  Ambulatory status? Walker? Wheelchair?: Ambulatory. Reports she staggers and feels off balance.  SAFETY ISSUES:  Prior radiation? yes  Pacemaker/ICD? denies  Possible current pregnancy? no  Is the  patient on methotrexate? no  Current Complaints / other details:  78 year old female.

## 2019-05-18 ENCOUNTER — Telehealth: Payer: Self-pay | Admitting: Hematology

## 2019-05-18 ENCOUNTER — Ambulatory Visit
Admission: RE | Admit: 2019-05-18 | Discharge: 2019-05-18 | Disposition: A | Discharge status: Home / Self Care | Payer: Medicare Other | Source: Ambulatory Visit | Attending: Radiation Oncology | Admitting: Radiation Oncology

## 2019-05-18 ENCOUNTER — Encounter: Payer: Self-pay | Admitting: Radiation Oncology

## 2019-05-18 ENCOUNTER — Other Ambulatory Visit: Payer: Self-pay | Admitting: Hematology

## 2019-05-18 ENCOUNTER — Other Ambulatory Visit: Payer: Self-pay

## 2019-05-18 VITALS — Ht 66.0 in | Wt 114.0 lb

## 2019-05-18 DIAGNOSIS — G9389 Other specified disorders of brain: Secondary | ICD-10-CM | POA: Diagnosis not present

## 2019-05-18 DIAGNOSIS — Z923 Personal history of irradiation: Secondary | ICD-10-CM | POA: Diagnosis not present

## 2019-05-18 DIAGNOSIS — C7951 Secondary malignant neoplasm of bone: Secondary | ICD-10-CM

## 2019-05-18 DIAGNOSIS — C9002 Multiple myeloma in relapse: Secondary | ICD-10-CM

## 2019-05-18 MED ORDER — PROCHLORPERAZINE MALEATE 10 MG PO TABS: 10.0000 mg | ORAL_TABLET | Freq: Four times a day (QID) | ORAL | 1 refills | Status: DC | PRN

## 2019-05-18 MED ORDER — CYCLOPHOSPHAMIDE 50 MG PO CAPS: 300.0000 mg/m2 | ORAL_CAPSULE | ORAL | 3 refills | Status: DC

## 2019-05-18 MED ORDER — DEXAMETHASONE 4 MG PO TABS: 20.0000 mg | ORAL_TABLET | ORAL | 3 refills | Status: DC

## 2019-05-18 MED ORDER — ONDANSETRON HCL 8 MG PO TABS: ORAL_TABLET | ORAL | 1 refills | Status: DC

## 2019-05-18 NOTE — Telephone Encounter (Signed)
I spoke with pt today and discussed changing her treatment to CyBorD (cyclophosphamide 300 mg/m, Velcade 1.3mg /m2, and dexa 20mg , weekly).  Benefit and side effects, especially cardiac toxicity, nausea vomiting, neuropathy, risk of infection, worsening osteoporosis and fracture, with patient in details, she agrees to proceed.  I called in cyclophosphamide prescription today was from pharmacy today, plan to start next week.  [name]  05/18/2019

## 2019-05-18 NOTE — Telephone Encounter (Signed)
I called pt again today (both home and cell number), and left her  VM to call me back to discuss changing her treatment plan.   [name]  05/18/2019

## 2019-05-18 NOTE — Progress Notes (Signed)
Radiation Oncology         [phone] ________________________________  Outpatient Re- Consultation - Conducted via video-enabled Doximity due to current COVID-19 concerns for limiting patient exposure  Name: Shannon Rush MRN: [id number]  Date of Service: 05/18/2019 DOB: [birth year]  [id number], Jamal Collin, MD  [name], MD   REFERRING PHYSICIAN: [name], MD  DIAGNOSIS: 78 yo woman with multiple myeloma and painful involvement of the right occipital skull    ICD-10-CM   1. Secondary malignant neoplasm of bone and bone marrow (HCC)  C79.51    C79.52   2. Multiple myeloma in relapse [facility])  C90.02     HISTORY OF PRESENT ILLNESS: JNAYA Rush is a 79 y.o. female seen at the request of Dr. Burr Medico. She has a history of Stage IIIA multiple myeloma, diagnosed in 2013. We have treated her twice in the past, with palliative radiotherapy. She was last seen here for treatment to a symptomatic skull base lesion in 01/2019.  Oncology History Summary: Oncology History Overview Note  Multiple myeloma [facility])   Staging form: Multiple Myeloma, AJCC 6th Edition   - Clinical: Stage IIIA - Signed by [name], MD on 09/04/2013    Multiple myeloma ([place])  05/26/2012 Initial Diagnosis   Presenting IgG was 4,040 mg/dL on 06/05/2012 (IgA 35; IgM 34); kappa free light chain 34.4 mg/dL, lambda 0.00, kappa:lambda ratio of 34.75; SPEP with M-spike of 2.60.  06/30/2012 Imaging   Numerous lytic lesions throughout the calvarium.  Lytic lesion within the left lateral scapula.  Findings most compatible with metastases or myeloma. Multiple mid and lower thoracic compression fractures.  Slight compression through the endplates at L3.   17/71/1657 Bone Marrow Biopsy   Bone marrow biopsy showed 49% plasma cell. Normal classical cytogenetics; however, myeloma FISH panel showed 13q- (intermediate risk)       07/14/2012 - 07/27/2012 Radiation Therapy   Palliative radiation 20 Gy over 10 fractions between to thoracic  spine cord compression and symptomatic left scapula lytic lesion.   08/10/2012 - 11/2012 Chemotherapy   Started SQ Velcade once weekly, 3 weeks on, 1 weeks off; daily Revlimid d1-21, 7 days off; and Dexamethasone 93m PO weekly.    02/12/2013 Bone Marrow Transplant   [facility]    06/14/2013 Tumor Marker   IgG  1830,  Kappa:lamba ratio, 1.69 (baseline was 34.75 or   M-spike 0.28 (baseline of 2.6 or  89.3% of baseline).     06/25/2013 -  Chemotherapy   Started zometa 3.5 mg monthly    09/01/2013 - 01/2014 Chemotherapy   Maintenance therapy with revlimid 51mdaily for 14 days and then off for 7 (decreased from 21 days on and 7 days off based on neutropenia). Stopped due to disease progression 01/2017   09/13/2013 - 09/17/2014 Hospital Admission   Hospital admission for pneumonia   12/20/2013 Treatment Plan Change   Maintenance therapy decreased to 2.5 mg daily for 21 days and then off for 7 days based on low counts.    01/25/2014 Tumor Marker   IgG 1360 M spike 0.5   03/01/2014 - 03/05/2014 Hospital Admission   [facility], received thrombolytic therapy, cath showed 60% prox RCA, mid-distal RCA 50%, Echo normal, no stents.   04/11/2014 - 08/07/2014 Chemotherapy   Continue Revlimid at 2.5 mg 3 weeks on 1 week off   08/08/2014 - 08/13/2014 Hospital Admission   Hospital admission for pneumonia. Her Revlimid  was held.   08/29/2014 - 09/14/2014 Chemotherapy   Maintenance Revlimid restarted   09/14/2014 - 09/17/2014 Hospital Admission   Admitted for pneumonia after coming back from a trip in [name]. Revlimid was held again.   11/13/2014 - 12/25/2015 Chemotherapy   Maintenance Revlimid restarted, changed to 2.41m daily, 2 weks on, 1 week off from 12/15/2014, stopped due to disease progression    01/24/2016 Progression   Patient is M protein has gradually increased to 1.0g, repeated a bone marrow biopsy showed plasma cell 10-30%     02/27/2016 - 03/07/2016 Chemotherapy   Pomalidomide 4 mg daily on day 1-21, dexamethasone 40 mg on day 1, 8 and 15, every 28 days, started on 02/27/2016, daratumumab weekly started on 03/07/2016, held after first dose, due to hospitalization and a severe pancytopenia, pomalidomide was stopped afterwards    03/10/2016 - 03/29/2016 Hospital Admission   Pt was admitted for sepsis from pneumonia, and severe pancytopenia. She required ICU stay for a few days due to hypotension, developed b/l pleural effusion required diuretics and b/l thoracentesis. She also required blood and plt transfusion, and prolonged neupogen injection for severe neutropenia. She was discharged home after 3 weeks hospital stay    04/26/2016 -  Chemotherapy   Daratumumab restarted on 04/26/2016, Velcade 1.348mm2 and dexa 2066meekly start on 11/3, velcade held since 06/28/2016 due to CHF, dexa stopped on12/11/2017 due to high risk of fracture.  Dara reduced to every 4 weeks due to COVID-19 starting 10/23/18. Returned to every 3 weeks starting 12/17/18. Restarted every 2 weeks starting 02/22/19   08/26/2016 Echocardiogram   - Left ventricle: LVEF is approximately 35% with diffuse diffuse   hypokinesis with severe hypokinesis/ akinesis of the   inferior/inferoseptal walls. In comparison to echo images from   December 2017, there does not appear to be significant change The   cavity size was normal. Wall thickness was normal. Doppler   parameters are consistent with abnormal left ventricular   relaxation (grade 1 diastolic dysfunction). - Aortic valve: AV is thickened. 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 Present in echo from December 2017 There was   trivial regurgitation. - Right ventricle: Systolic function was mildly reduced. - Right atrium: The atrium was mildly dilated. - Pericardium, extracardiac: A trivial pericardial effusion was   identified.   12/03/2016  Echocardiogram   -Left Ventricle: Systolic function was   mildly to moderately reduced. The estimated ejection fraction was   in the range of 40% to 45%. Diffuse hypokinesis. Doppler   parameters are consistent with abnormal left ventricular   relaxation (grade 1 diastolic dysfunction). Doppler parameters   are consistent with indeterminate ventricular filling pressure. - Left atrium: The atrium was severely dilated. - Tricuspid valve: There was trivial regurgitation. - Pulmonary arteries: Systolic pressure was within the normal   range. PA peak pressure: 33 mm Hg (S). - Global longitudinal strain -10.5% (abnormal)   03/12/2017 Echocardiogram   ECHO 03/12/17 Study Conclusions - Left ventricle: The cavity size was normal. Wall thickness was   increased in a pattern of mild LVH. Systolic function was normal.   The estimated ejection fraction was in the range of 50% to 55%.   There is hypokinesis of the basalinferior myocardium. Doppler   parameters are consistent with abnormal left ventricular   relaxation (grade 1 diastolic dysfunction). - Aortic valve: There was trivial regurgitation. - Mitral valve: Calcified annulus. Mildly thickened leaflets .  There was trivial regurgitation. - Tricuspid valve: There was mild regurgitation. Impressions: - There has been mild improvement in EF since prior study.    12/12/2017 - 12/15/2017 Hospital Admission   Admission diagnosis: Pnuemonia and acute hypoxic respiratory failure Additional comments: treated for pneumoania and acute hypoxic respiratory failure before she was discharged on 12/15/17 with oral anitbiotics. She did not require home oxygen.    12/30/2017 - 01/02/2018 Hospital Admission   Admission diagnosis: Pnuemonia  Additional comments: She was again hospitalized on 12/30/17 for pneumonia, ID work up was otherwise negative, she was discharged home with oral antibiotics.    04/01/2018 Imaging   04/01/2018 CXR IMPRESSION: Persisting  airspace disease superimposed on chronic changes, suggesting pneumonia given the history. Followup PA and lateral chest X-ray is recommended in 3-4 weeks following therapy to ensure resolution and exclude underlying malignancy.  Redemonstration of mid and lower thoracic compression fractures with associated thoracic kyphosis.   04/23/2018 Echocardiogram   04/23/2018 ECHO LV EF: 45% -   50%   01/07/2019 Imaging   MRI Brain 01/07/19 IMPRESSION: MRI HEAD IMPRESSION:   1. 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 Lesion closely approximates the left inner ear structures and involves the left Meckel's cave and left fifth cranial nerve, and most likely is the causative etiology for patient's underlying left ear and facial symptoms. Follow-up examination with postcontrast imaging suggested for complete evaluation. Inclusion of IAC sequences would likely be helpful for visualization. 2. Additional 2.7 cm lesion involving the right occipital calvarium with adjacent marrow signal abnormality, also likely related to multiple myeloma. Metastatic disease would be the primary differential consideration. 3. Underlying age-appropriate cerebral atrophy with mild chronic microvascular ischemic disease. No other acute intracranial abnormality.   MRA HEAD IMPRESSION:   1. Negative intracranial MRA with no large vessel occlusion, hemodynamically significant stenosis, or other acute vascular abnormality. 2. Petrous and cavernous left ICA is partially encased and surrounded by the left petrous apex lesion without significant irregularity or stenosis. 3. 2 mm right paraophthalmic aneurysm.   MRA NECK IMPRESSION:   Normal MRA of the neck with wide patency of the carotid and vertebral arteries bilaterally. No hemodynamically significant stenosis or other acute  vascular abnormality identified.    01/14/2019 - 01/27/2019 Radiation Therapy   Radaition to Left petrous apex bone lesion 01/14/19-01/27/19 with Dr. Tammi Klippel.      INTERVAL HISTORY: Since we saw her last, she had continued on systemic chemotherapy with daratumumab and dexamethasone, most recently increased to 2-week intervals with the addition of Velcade on 05/10/2019.  She was found to have an increase in M protein from 0.3 to 0.6, prompting a restaging bone marrow biopsy on 04/01/2019. Pathology and cytology showed normocellular marrow and minimal involvement by plasma cell neoplasm (2%). She also underwent PET scan on 04/09/2019 for disease restaging, which showed scattered lytic lesions with hypermetabolic activity including a 3.5 cm lesion in the right occipital bone, a lytic appearing lesion involving the C7 spinous process, and a lytic, expansile lesion of the left fourth rib anteriorly, measuring about 3.1 by 1.9 cm in size and extending along the adjacent pleural space. She reports that the occipital lesion is progressively enlarging and is becoming more painful.  She has mild occasional pain in the cervical spine and left rib regions but these remain well controlled with use of Tylenol only, as needed.  Regarding her systemic chemotherapy,  Dr. Burr Medico has consulted with [facility]. Samule Ohm at [facility] and they are in agreement with the recommendation to change her current systemic regimen to CyBorD (cyclophosphamide/Velcade/dexamethasone) every 3 weeks to begin in the near future.   04/01/2019 Pathology Results   Bone Marrow biopsy 04/01/19  DIAGNOSIS: BONE MARROW, ASPIRATE, CLOT, CORE: - Normocellular marrow with minimal involvement by plasma cell neoplasm - See comment PERIPHERAL BLOOD: - Macrocytic anemia and leukopenia -See complete blood count   04/09/2019 PET scan   PET 04/09/19  IMPRESSION: 1. Scattered lytic lesions of bone. A few of these demonstrate hypermetabolic activity, most notably the  right occipital bone lesion; the lytic lesion in the approximately C7 spinous process; the lytic expansile lesion of the left fourth rib anteriorly; and a small lytic focus in the right femoral head. Other scattered lytic lesions have only low-grade activity. 2. Multiple fractures as detailed above. 3. Other imaging findings of potential clinical significance: Aortic Atherosclerosis (ICD10-I70.0). Coronary atherosclerosis.   05/24/2019 -  Chemotherapy   The patient had BORTEZOMIB CHEMO SQ INJECTION 3.5 MG, 1.5 mg/m2, Subcutaneous,  Once, 0 of 4 cycles  for chemotherapy treatment.       PREVIOUS RADIATION THERAPY: Yes  01/14/2019 - 01/27/2019: Left Petrous Apex / 20 Gy in 10 fractions  07/14/2012 - 07/27/2012: Thoracic Spine and Left Scapula / 20 Gy in 10 fractions  PAST MEDICAL HISTORY:  Past Medical History:  Diagnosis Date   CAD (coronary artery disease)    a. 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 trivial by echo 06/2017.   OSTEOPOROSIS 06/11/2010   Multiple compression fractures; and spontaneous fracture of sternum Qualifier: Diagnosis of  By: [name], Susan      Thoracic kyphosis 07/13/12   per MRI scan   Unspecified deficiency anemia       PAST SURGICAL HISTORY: Past Surgical History:  Procedure Laterality Date   APPENDECTOMY     CATARACT EXTRACTION, BILATERAL     CESAREAN SECTION     x2    COLONOSCOPY  2007   neg with Dr. [name]   ELBOW SURGERY     HIP SURGERY  2009     left   TUBAL LIGATION      FAMILY HISTORY:  Family History  Problem Relation Age of Onset   Glaucoma Mother    Heart disease Mother    Peripheral vascular disease Mother    Raynaud syndrome Mother    Heart disease Father    Heart failure Father    Glaucoma Father    Cancer Brother        stage IV prostate CA   Prostate cancer Brother    Raynaud syndrome Daughter    Raynaud syndrome Daughter    Breast cancer Neg Hx    Colon cancer Neg Hx     SOCIAL HISTORY:  Social History   Socioeconomic History   Marital status: Married    Spouse name: Fritz Pickerel   Number of children:  2   Years of education: Not on file   Highest education level: Doctorate  Occupational History   Occupation: Museum/gallery conservator: [employer] resource strain: Not hard at all   Food insecurity    Worry: Never true    Inability: Never true   Transportation needs    Medical: No    Non-medical: No  Tobacco Use   Smoking status: Never Smoker   Smokeless tobacco: Never Used  Substance and Sexual Activity   Alcohol use: Yes    Alcohol/week: 7.0 standard drinks    Types: 7 Standard drinks or equivalent per week    Comment: 1 glass of wine or beer 3-5 days per week   Drug use: No   Sexual activity: Yes    Birth control/protection: Post-menopausal  Lifestyle   Physical activity    Days per week: 5 days    Minutes per session: 60 min   Stress: Not at all  Relationships   Social connections    Talks on phone: More than three times a week    Gets together: More than three times a week    Attends religious service: Never    Active member of club or organization: Yes    Attends meetings of clubs or organizations: More than 4 times per year    Relationship status: Married   Intimate partner violence    Fear of current or ex partner: No    Emotionally abused: No    Physically abused: No    Forced sexual activity: No   Other Topics Concern   Not on file  Social History Narrative   Who lives with you: spouse, 2 story home, does not have problems with the stairs, has walk in shower, handrails.   Any pets: Dog, Brayden, black lab/boxer mix   Diet: Patient has a varied diet of protein, vegetables, starches.  Limits red meats.   Exercise: Patient exercises at least 3 times a week for over an hour.   Seatbelts: Patient reports wearing her seatbelt when in vehicle.    Nancy Fetter Exposure/Protection: Patient reports wearing sunscreen daily.   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 Biked 5 miles this morning to this appointment.    ALLERGIES: Zithromax [azithromycin], Zosyn [piperacillin sod-tazobactam so], and Quinolones  MEDICATIONS:  Current Outpatient Medications  Medication Sig Dispense Refill   acetaminophen (TYLENOL) 500 MG tablet Take 500 mg by  mouth every 6 (six) hours as needed for moderate pain or fever.     acyclovir (ZOVIRAX) 400 MG tablet Take 1 tablet (400 mg total) by mouth 2 (two) times daily. 180 tablet 3   aspirin EC 81 MG tablet Take 1 tablet (81 mg total) by mouth daily. 90 tablet 3   atorvastatin (LIPITOR) 20 MG tablet Take 1 tablet (20 mg total) by mouth daily. 10 tablet 0   Calcium Carbonate-Vit D-Min (CALCIUM 600+D PLUS MINERALS) 600-400 MG-UNIT TABS Take by mouth.     metoprolol succinate (TOPROL-XL) 25 MG 24 hr tablet Take  1 tablet (25 mg total) by mouth daily. 15 tablet 0   cyclophosphamide (CYTOXAN) 50 MG capsule Take 10 capsules (500 mg total) by mouth once a week. Take 300 mg/m2 by mouth with breakfast once weekly. Take with food to minimize GI upset. Take early in the day and maintain hydration. 40 capsule 3   dexamethasone (DECADRON) 4 MG tablet Take 5 tablets (20 mg total) by mouth once a week. Take on the day of chemo 20 tablet 3   doxycycline (VIBRA-TABS) 100 MG tablet Take 100 mg by mouth as needed.     ondansetron (ZOFRAN) 8 MG tablet Take 8 mg by mouth 30 to 60 min prior to Cytoxan administration then take 8 mg twice daily as needed for nausea and vomiting. 30 tablet 1   prochlorperazine (COMPAZINE) 10 MG tablet Take 1 tablet (10 mg total) by mouth every 6 (six) hours as needed (Nausea or vomiting). 30 tablet 1   No current facility-administered medications for this encounter.     REVIEW OF SYSTEMS:  On review of systems, the patient reports that she is doing well overall. She denies any chest pain, shortness of breath, cough, fevers, chills, night sweats, unintended weight changes. She denies any bowel or bladder disturbances, and denies abdominal pain, nausea or vomiting. She reports mild right occipital pain and occasional neck and low back pain. She states the back pain can get up to a 6-7/10 but is relieved with repositioning or a rare 1/2 tylenol. The right occipital lesion is concerning to her because it is progressively enlarging and becoming more tender to palpation. She also notes peripheral neuropathy in her feet, left knee issues, and soreness to her neck, back, and shoulders. She reports some chronic gait imbalance but denies falling and imbalance is not changed recently. She states that she is able to continue riding her bike and staying active. She is currently spending 2 weeks with her grandchildren in [place], [place] with [place] during this COVID-19 pandemic since their school  is under a 2 week quarantine currently.  She will be returning home on 05/23/19.  A complete review of systems is obtained and is otherwise negative.   PHYSICAL EXAM:  Wt Readings from Last 3 Encounters:  05/18/19 114 lb (51.7 kg)  05/10/19 114 lb 4.8 oz (51.8 kg)  04/27/19 115 lb 1.9 oz (52.2 kg)   Temp Readings from Last 3 Encounters:  05/10/19 97.8 F (36.6 C) (Oral)  05/10/19 98.7 F (37.1 C)  04/26/19 97.6 F (36.4 C) (Oral)   BP Readings from Last 3 Encounters:  05/10/19 (!) 91/57  05/10/19 114/76  04/27/19 110/64   Pulse Readings from Last 3 Encounters:  05/10/19 68  05/10/19 80  04/27/19 83   Pain Assessment Pain Score: 2  Pain Frequency: Constant Pain Loc: (peripheral neuropathy in both feet, sore neck and entire back, sore shoulders, and bad left knee)/10  In general this is a well appearing Caucasian woman in no acute distress. She's alert and oriented x4 and appropriate throughout the examination. Cardiopulmonary assessment is negative for acute distress and she exhibits normal effort.   KPS = 80  100 - Normal; no complaints; no evidence of disease. 90   - Able to carry on normal activity; minor signs or symptoms of disease. 80   - Normal activity with effort; some signs or symptoms of disease. 41   - Cares for self; unable to carry on normal activity or to do active work. 60   - Requires occasional assistance, but is able to care for most of his personal needs. 50   - Requires considerable assistance and frequent medical care. 51   - Disabled; requires special care and assistance. 1   - Severely disabled; hospital admission is indicated although death not imminent. 59   - Very sick; hospital admission necessary; active supportive treatment necessary. 10   - Moribund; fatal processes progressing rapidly. 0     - Dead  Karnofsky DA, Abelmann [name], Craver LS and [facility] [phone]) The use of the nitrogen mustards in the palliative treatment of carcinoma: with  particular reference to bronchogenic carcinoma Cancer 1 634-56  LABORATORY DATA:  Lab Results  Component Value Date   WBC 3.5 (L) 05/10/2019   HGB 9.1 (L) 05/10/2019   HCT 27.4 (L) 05/10/2019   MCV 107.9 (H) 05/10/2019   PLT 133 (L) 05/10/2019   Lab Results  Component Value Date   NA 139 05/10/2019   K 3.7 05/10/2019   CL 105 05/10/2019   CO2 23 05/10/2019   Lab Results  Component Value Date   ALT 15 05/10/2019   AST 19 05/10/2019   ALKPHOS 45 05/10/2019   BILITOT 0.7 05/10/2019     RADIOGRAPHY: No results found.    IMPRESSION/PLAN: This visit was conducted via Doximity to spare the patient unnecessary potential exposure in the healthcare setting during the current COVID-19 pandemic.  1. 78 y.o. woman with relapse of stage IIIA multiple myeloma and symptomatic occipital lesion Today, we talked to the patient about the findings and workup thus far. We reviewed her recent PET scan together, and pointed out the hypermetabolic lesions of concern. She reports that the cervical spine and rib sites are not bothersome enough to warrant radiation at this time.  She has a good understanding of radiation therapy, as she has been treated twice before. The recommendation is to proceed with a course of palliative radiotherapy delivered over 10 fractions to the symptomatic right occipital lesion. We reviewed the anticipated acute and late sequelae associated with radiation in this setting and she was encouraged to ask questions that were answered to her satisfaction.  She appears to have a good understanding of her disease and our treatment recommendations which are of palliative intent.  At the end of our conversation, the patient elects to proceed with palliative radiation therapy to the enlarging and symptomatic right occipital lesion. She is tentatively scheduled for CT simulation on Tuesday, 05/25/2019, at 9:30 am in anticipation of beginning her treatments in the near future.  We will share  our discussion with Dr. Burr Medico and move forward with treatment planning accordingly.  Given current concerns for patient exposure during the COVID-19 pandemic, this encounter was conducted via video-enabled Doximity visit. The patient has given verbal consent for this type of encounter. The time spent during this encounter was 45 minutes. The attendants for this meeting include Rodman Key  Tammi Klippel MD, Ashlyn Bruning PA-C, [place], and patient, TYESHIA CORNFORTH. During the encounter, Tyler Pita MD, Ashlyn Bruning PA-C, and scribe, Wilburn Mylar were located at [place].  Patient, GUILLERMO DIFRANCESCO was located at home.    Nicholos Johns, PA-C    Tyler Pita, MD  [facility] Direct Dial: [phone]   Fax: [phone] [email or web address]   Skype   LinkedIn   This document serves as a record of services personally performed by Tyler Pita, MD and Freeman Caldron, PA-C. It was created on their behalf by Wilburn Mylar, a trained medical scribe. The creation of this record is based on the scribe's personal observations and the provider's statements to them. This document has been checked and approved by the attending provider.

## 2019-05-19 ENCOUNTER — Telehealth: Payer: Self-pay | Admitting: Pharmacist

## 2019-05-19 DIAGNOSIS — C9002 Multiple myeloma in relapse: Secondary | ICD-10-CM

## 2019-05-19 NOTE — Telephone Encounter (Signed)
Oral Oncology Pharmacist Encounter  Received new prescription for Cytoxan (cyclophosphamide) for the treatment of multiple myeloma not having achieved remission in conjunction with bortezomib and dexamethasone, planned duration 4-6 cycles then reassessment.  Cyclophosphamide is planned to be administered at ~320 mg/m2 (500 mg) by mouth once weekly Bortezomib is planned to be administered at 1.5 mg/m2 SQ once weekly Dexamethasone is planned to be administered at 20 mg by mouth once weekly on days of bortezomib injections  Labs from 05/10/19 assessed, OK for treatment initiation.  Current medication list in Epic reviewed, no DDIs with cyclophosphamide identified.  Noted patient with acyclovir 400 mg by mouth twice daily for VZV prophylaxis on medication list. I will conform patient remains on this important supportive care medication when I perform initial counseling.  Cyclophosphamide prescription has been e-scribed to the [facility] for benefits analysis and approval. [facility]amethasone [facility] [address].  Oral Oncology Clinic will continue to follow for insurance authorization, copayment issues, initial counseling and start date.  Jesse Mack, PharmD, BCPS, BCOP  05/19/2019 3:48 PM Oral Oncology Clinic [phone]  

## 2019-05-20 ENCOUNTER — Telehealth: Payer: Self-pay

## 2019-05-20 ENCOUNTER — Telehealth: Payer: Self-pay | Admitting: Radiation Oncology

## 2019-05-20 MED ORDER — CYCLOPHOSPHAMIDE 50 MG PO CAPS: 300.0000 mg/m2 | ORAL_CAPSULE | ORAL | 3 refills | Status: DC

## 2019-05-20 NOTE — Telephone Encounter (Signed)
Oral Chemotherapy Pharmacist Encounter   Attempted to reach patient to provide update and offer for initial counseling on oral medication: cyclophosphamide.  No answer.  Left voicemail for patient to call back to discuss details of medication acquisition and initial counseling session.  Insurance authorization for cyclophosphamide has been approved. Test claim at the pharmacy revealed copayment $47.24 for 1st 4 week supply of cyclophosphamide.  Above information will be reported to patient when we are able to speak for initial counseling.  Johny Drilling, PharmD, BCPS, BCOP  05/20/2019   11:39 AM Oral Oncology Clinic [phone]

## 2019-05-20 NOTE — Telephone Encounter (Signed)
Oral Oncology Patient Advocate Encounter  Received notification from [employer] that prior authorization for Cyclophosphamide is required.  PA submitted on CoverMyMeds Key [id number] Status is pending  Oral Oncology Clinic will continue to follow.  [place] Patient [place] Phone [phone] Fax [phone] 05/20/2019    9:37 AM

## 2019-05-20 NOTE — Telephone Encounter (Signed)
Oral Chemotherapy Pharmacist Encounter   I spoke with patient for overview of: cyclophosphamide for the treatment of multiple myeloma not having achieved remission in conjunction with bortezomib and dexamethasone, planned duration 4-6 cycles then reassessment.   Counseled patient on administration, dosing, side effects, monitoring, drug-food interactions, safe handling, storage, and disposal.  Patient will take cyclophosphamide 21m capsules, 10 capsules (5047m by mouth once weekly, to be given on the days of Velcade injection.  Patient will take cyclophosphamide without regard to food, however, administration with food may decrease GI upset. Patient instructed to take cyclophosphamide early in the day and to maintain adequate hydration to prevent bladder toxicity. She will also take ondansetron 8 mg tablet 30 min prior to cyclophosphamide to prevent nausea.  Cyclophosphamide start date: 05/24/2019  Adverse effects include but are not limited to: decreased blood counts, nausea, vomiting, diarrhea, hair loss, and hemorrhagic cystitis.    Reviewed with patient importance of keeping a medication schedule and plan for any missed doses.  Medication reconciliation performed and medication/allergy list updated.  Insurance authorization for cyclophosphamide has been obtained. Test claim at the pharmacy revealed copayment $47 for 1st fill of cyclophosphamide. They will contact oral oncology patient advocate if they want to have a copayment grant secured to cover these out of pocket expenses.  They will pick up the 1st fill of cyclophosphamide from the [facility] will pick up dexamethasone, ondansetron, and prochlorperazine [facility].  All questions answered.  Shannon Rush voiced understanding and appreciation.   Patient knows to call the office with questions or  concerns.  JeJohny DrillingPharmD, BCPS, BCOP  05/20/2019 12:59 PM Oral Oncology Clinic [phone]

## 2019-05-20 NOTE — Telephone Encounter (Signed)
Oral Oncology Patient Advocate Encounter  Prior Authorization for Cyclophosphamide has been approved.    PA# [id number] Effective dates: 05/20/19 through 07/14/20  Oral Oncology Clinic will continue to follow.   [place] Patient [place] Phone [phone] Fax [phone] 05/20/2019    9:50 AM

## 2019-05-20 NOTE — Telephone Encounter (Signed)
Received call from patient requesting this RN ask Dr. Tammi Klippel if he would consider treating the growth behind her left ear. She questions if treatment would relieve the underwater sound she has in her ear presently. Also, she questions the risk vs benefit of retreating this area. Patient understands this RN will phone her back with Dr. Johny Shears response.   Patient scheduled for simulation on 05/25/19 at 0930.

## 2019-05-21 ENCOUNTER — Telehealth: Payer: Self-pay | Admitting: Radiation Oncology

## 2019-05-21 MED FILL — CYCLOPHOSPHAMIDE 50 MG CAPS: 50 | 28 days supply | Qty: 40 | Fill #0

## 2019-05-21 NOTE — Progress Notes (Signed)
[place]   [phone] [phone]   Clinic Follow up Note   Patient Care Team: [name], MD as PCP - General (Family Medicine) [name], MD as PCP - Cardiology (Cardiology) [name], MD as Attending Physician (Obstetrics and Gynecology) [name], MD as Consulting Physician (Internal Medicine) [name], MD as Consulting Physician (Hematology) [name], MD as Consulting Physician (Ophthalmology) [name], MD as Consulting Physician (Cardiology) [name], MD as Consulting Physician (Dermatology) [name] (Dentistry) [name], DPM as Consulting Physician (Podiatry) Pleasant, Eppie Gibson, RN as [place] [facility]  Date of Service:  05/24/2019  CHIEF COMPLAINT: F/u on MM  SUMMARY OF ONCOLOGIC HISTORY: Oncology History Overview Note  Multiple myeloma [facility])   Staging form: Multiple Myeloma, AJCC 6th Edition   - Clinical: Stage IIIA - Signed by [name], MD on 09/04/2013    Multiple myeloma ([place])  05/26/2012 Initial Diagnosis   Presenting IgG was 4,040 mg/dL on 06/05/2012 (IgA 35; IgM 34); kappa free light chain 34.4 mg/dL, lambda 0.00, kappa:lambda ratio of 34.75; SPEP with M-spike of 2.60.   06/30/2012 Imaging   Numerous lytic lesions throughout the calvarium.  Lytic lesion within the left lateral scapula.  Findings most compatible with metastases or myeloma. Multiple mid and lower thoracic compression fractures.  Slight compression through the endplates at L3.   02/63/7858 Bone Marrow Biopsy   Bone marrow biopsy showed 49% plasma cell. Normal classical cytogenetics; however, myeloma FISH panel showed 13q- (intermediate risk)       07/14/2012 - 07/27/2012 Radiation Therapy   Palliative radiation 20 Gy over 10 fractions between to thoracic spine cord compression and symptomatic left scapula lytic lesion.   08/10/2012 - 11/2012 Chemotherapy   Started SQ Velcade once weekly, 3 weeks on, 1 weeks off; daily Revlimid d1-21, 7 days off; and Dexamethasone 75m PO weekly.    02/12/2013 Bone Marrow Transplant   [facility]    06/14/2013 Tumor Marker   IgG  1830,  Kappa:lamba ratio, 1.69 (baseline was 34.75 or   M-spike 0.28 (baseline of 2.6 or  89.3% of baseline).     06/25/2013 -  Chemotherapy   Started zometa 3.5 mg monthly    09/01/2013 - 01/2014 Chemotherapy   Maintenance therapy with revlimid 571mdaily for 14 days and then off for 7 (decreased from 21 days on and 7 days off based on neutropenia). Stopped due to disease progression 01/2017   09/13/2013 - 09/17/2014 Hospital Admission   Hospital admission for pneumonia   12/20/2013 Treatment Plan Change   Maintenance therapy decreased to 2.5 mg daily for 21 days and then off for 7 days based on low counts.    01/25/2014 Tumor Marker   IgG 1360 M spike 0.5   03/01/2014 - 03/05/2014 Hospital Admission   [facility], received thrombolytic therapy, cath showed 60% prox RCA, mid-distal RCA 50%, Echo normal, no stents.   04/11/2014 - 08/07/2014 Chemotherapy   Continue Revlimid at 2.5 mg 3 weeks on 1 week off   08/08/2014 - 08/13/2014 Hospital Admission   Hospital admission for pneumonia. Her Revlimid was held.   08/29/2014 - 09/14/2014 Chemotherapy   Maintenance Revlimid restarted   09/14/2014 - 09/17/2014 Hospital Admission   [place].   11/13/2014 - 12/25/2015 Chemotherapy   Maintenance Revlimid restarted, changed to  2.65m daily, 2 weks on, 1 week off from 12/15/2014, stopped due to disease progression    01/24/2016 Progression   Patient is M protein has gradually increased to 1.0g, repeated a bone marrow biopsy showed plasma cell 10-30%   02/27/2016 - 03/07/2016 Chemotherapy   Pomalidomide 4 mg daily on day 1-21, dexamethasone 40 mg on day 1,  8 and 15, every 28 days, started on 02/27/2016, daratumumab weekly started on 03/07/2016, held after first dose, due to hospitalization and a severe pancytopenia, pomalidomide was stopped afterwards    03/10/2016 - 03/29/2016 Hospital Admission   Pt was admitted for sepsis from pneumonia, and severe pancytopenia. She required ICU stay for a few days due to hypotension, developed b/l pleural effusion required diuretics and b/l thoracentesis. She also required blood and plt transfusion, and prolonged neupogen injection for severe neutropenia. She was discharged home after 3 weeks hospital stay    04/26/2016 - 04/26/2019 Chemotherapy   Daratumumab restarted on 04/26/2016, Velcade 1.351mm2 and dexa 2050meekly start on 11/3, velcade held since 06/28/2016 due to CHF, dexa stopped on12/11/2017 due to high risk of fracture.  Dara reduced to every 4 weeks due to COVID-19 starting 10/23/18. Returned to every 3 weeks starting 12/17/18. Restarted every 2 weeks starting 02/22/19 Stopped on 04/26/19 due to disease progression.    08/26/2016 Echocardiogram   - Left ventricle: LVEF is approximately 35% with diffuse diffuse   hypokinesis with severe hypokinesis/ akinesis of the   inferior/inferoseptal walls. In comparison to echo images from   December 2017, there does not appear to be significant change The   cavity size was normal. Wall thickness was normal. Doppler   parameters are consistent with abnormal left ventricular   relaxation (grade 1 diastolic dysfunction). - Aortic valve: AV is thickened. 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 Present in echo from December 2017 There was   trivial regurgitation. - Right ventricle: Systolic function was mildly reduced. - Right atrium: The atrium was mildly dilated. - Pericardium, extracardiac: A trivial pericardial effusion was   identified.   12/03/2016 Echocardiogram   -Left Ventricle: Systolic  function was   mildly to moderately reduced. The estimated ejection fraction was   in the range of 40% to 45%. Diffuse hypokinesis. Doppler   parameters are consistent with abnormal left ventricular   relaxation (grade 1 diastolic dysfunction). Doppler parameters   are consistent with indeterminate ventricular filling pressure. - Left atrium: The atrium was severely dilated. - Tricuspid valve: There was trivial regurgitation. - Pulmonary arteries: Systolic pressure was within the normal   range. PA peak pressure: 33 mm Hg (S). - Global longitudinal strain -10.5% (abnormal)   03/12/2017 Echocardiogram   ECHO 03/12/17 Study Conclusions - Left ventricle: The cavity size was normal. Wall thickness was   increased in a pattern of mild LVH. Systolic function was normal.   The estimated ejection fraction was in the range of 50% to 55%.   There is hypokinesis of the basalinferior myocardium. Doppler   parameters are consistent with abnormal left ventricular   relaxation (grade 1 diastolic dysfunction). - Aortic valve: There was trivial regurgitation. - Mitral valve: Calcified annulus. Mildly thickened leaflets .   There was trivial regurgitation. - Tricuspid valve: There was mild regurgitation. Impressions: - There has been mild improvement in EF since prior study.    12/12/2017 - 12/15/2017 Hospital Admission   Admission diagnosis: Pnuemonia and acute hypoxic respiratory failure Additional comments: treated  for pneumoania and acute hypoxic respiratory failure before she was discharged on 12/15/17 with oral anitbiotics. She did not require home oxygen.    12/30/2017 - 01/02/2018 Hospital Admission   Admission diagnosis: Pnuemonia  Additional comments: She was again hospitalized on 12/30/17 for pneumonia, ID work up was otherwise negative, she was discharged home with oral antibiotics.    04/01/2018 Imaging   04/01/2018 CXR IMPRESSION: Persisting airspace disease superimposed on chronic  changes, suggesting pneumonia given the history. Followup PA and lateral chest X-ray is recommended in 3-4 weeks following therapy to ensure resolution and exclude underlying malignancy.  Redemonstration of mid and lower thoracic compression fractures with associated thoracic kyphosis.   04/23/2018 Echocardiogram   04/23/2018 ECHO LV EF: 45% -   50%   01/07/2019 Imaging   MRI Brain 01/07/19 IMPRESSION: MRI HEAD IMPRESSION:   1. 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 Lesion closely approximates the left inner ear structures and involves the left Meckel's cave and left fifth cranial nerve, and most likely is the causative etiology for patient's underlying left ear and facial symptoms. Follow-up examination with postcontrast imaging suggested for complete evaluation. Inclusion of IAC sequences would likely be helpful for visualization. 2. Additional 2.7 cm lesion involving the right occipital calvarium with adjacent marrow signal abnormality, also likely related to multiple myeloma. Metastatic disease would be the primary differential consideration. 3. Underlying age-appropriate cerebral atrophy with mild chronic microvascular ischemic disease. No other acute intracranial abnormality.   MRA HEAD IMPRESSION:   1. Negative intracranial MRA with no large vessel occlusion, hemodynamically significant stenosis, or other acute vascular abnormality. 2. Petrous and cavernous left ICA is partially encased and surrounded by the left petrous apex lesion without significant irregularity or stenosis. 3. 2 mm right paraophthalmic aneurysm.   MRA NECK IMPRESSION:   Normal MRA of the neck with wide patency of the carotid and vertebral arteries bilaterally. No hemodynamically significant stenosis or other acute vascular abnormality identified.     01/14/2019 - 01/27/2019 Radiation Therapy   Radaition to Left petrous apex bone lesion 01/14/19-01/27/19 with Dr. Tammi Klippel.    04/01/2019 Pathology Results   Bone Marrow biopsy 04/01/19  DIAGNOSIS: BONE MARROW, ASPIRATE, CLOT, CORE: - Normocellular marrow with minimal involvement by plasma cell neoplasm - See comment PERIPHERAL BLOOD: - Macrocytic anemia and leukopenia -See complete blood count   04/09/2019 PET scan   PET 04/09/19  IMPRESSION: 1. Scattered lytic lesions of bone. A few of these demonstrate hypermetabolic activity, most notably the right occipital bone lesion; the lytic lesion in the approximately C7 spinous process; the lytic expansile lesion of the left fourth rib anteriorly; and a small lytic focus in the right femoral head. Other scattered lytic lesions have only low-grade activity. 2. Multiple fractures as detailed above. 3. Other imaging findings of potential clinical significance: Aortic Atherosclerosis (ICD10-I70.0). Coronary atherosclerosis.   05/10/2019 -  Chemotherapy   Velcade q2weeeks and Dexa 32m weekly was added on 05/10/19. Switched to CyBorD (Oral cyclophosphamide CYTOXAN 5067m IV Velcade 1.62m362m2, and oral dexa 19m52meekly) starting 05/24/19.      CURRENT THERAPY:  -CyBorD (cyclophosphamide 500mg48m0mg/39m Velcade 1.62mg/m261mnd dexa 19mg, w62my) starting 05/24/19.   INTERVAL HISTORY:  Sheyann KDAISA STENNIS for a follow up and treatment. She presents to the clinic alone. She notes she plans to proceed with RT to her occipital lesion. She notes after radiation and  Thanksgiving she plans to go back to [place] where she is interested in continuing her treatment until coming back in 06/28/19. She notes she is set to see Dr. Doristine Devoid on 12/2 and would like to see him sooner or when she returns to [facility]. She notes mild left rib pain occasionally and only pain in right occipital from pressure. She otherwise is doing well, with good appetite and energy  level.    REVIEW OF SYSTEMS:   Constitutional: Denies fevers, chills or abnormal weight loss Eyes: Denies blurriness of vision Ears, nose, mouth, throat, and face: Denies mucositis or sore throat Respiratory: Denies cough, dyspnea or wheezes Cardiovascular: Denies palpitation, chest discomfort or lower extremity swelling Gastrointestinal:  Denies nausea, heartburn or change in bowel habits Skin: Denies abnormal skin rashes MSK: (+) Mild left rib pain, occasional Lymphatics: Denies new lymphadenopathy or easy bruising Neurological:Denies numbness, tingling or new weaknesses Behavioral/Psych: Mood is stable, no new changes  All other systems were reviewed with the patient and are negative.  MEDICAL HISTORY:  Past Medical History:  Diagnosis Date  . CAD (coronary artery disease)    a. inf STEMI (in [place] >>> [facility]) >>> LHC (8/15- [place]):  pRCA 60%, mid to dist RCA 50% >>> med rx  . Cardiomyopathy ([place])   . Cataract   . Chronic combined systolic and diastolic CHF (congestive heart failure) ([place])   . Cord compression ([place]) 07/13/12   MRI- diffuse myeloma involvement of T-L spine  . History of radiation therapy 07/13/12-07/27/12   spinal cord compression T3-T10,left scapula  . 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 Multiple myeloma ([place]) 07/01/2012  . MVP (mitral valve prolapse)    a. trivial by echo 06/2017.  . OSTEOPOROSIS 06/11/2010   Multiple compression fractures; and spontaneous fracture of sternum Qualifier: Diagnosis of  By: [name], [name]     . Thoracic kyphosis 07/13/12   per MRI scan  . Unspecified deficiency anemia     SURGICAL HISTORY: Past Surgical History:  Procedure Laterality Date  . APPENDECTOMY    . CATARACT EXTRACTION, BILATERAL    . CESAREAN SECTION     x2   . COLONOSCOPY  2007   neg with Dr. [name]  . ELBOW SURGERY    . HIP SURGERY  2009   left  . TUBAL LIGATION      I have reviewed the social history and family history  with the patient and they are unchanged from previous note.  ALLERGIES:  is allergic to zithromax [azithromycin]; zosyn [piperacillin sod-tazobactam so]; and quinolones.  MEDICATIONS:  Current Outpatient Medications  Medication Sig Dispense Refill  . acetaminophen (TYLENOL) 500 MG tablet Take 500 mg by mouth every 6 (six) hours as needed for moderate pain or fever.    Marland Kitchen acyclovir (ZOVIRAX) 400 MG tablet Take 1 tablet (400 mg total) by mouth 2 (two) times daily. 180 tablet 3  . aspirin EC 81 MG tablet Take 1 tablet (81 mg total) by mouth daily. 90 tablet 3  . atorvastatin (LIPITOR) 20 MG tablet Take 1 tablet (20 mg total) by mouth daily. 10 tablet 0  . Calcium Carbonate-Vit D-Min (CALCIUM 600+D PLUS MINERALS) 600-400 MG-UNIT TABS Take by mouth.    . cyclophosphamide (CYTOXAN) 50 MG capsule Take 10 capsules (500 mg total) by mouth once a week. Take with food to minimize GI upset. Take early in the day and maintain hydration. 40 capsule 3  . dexamethasone (DECADRON) 4 MG tablet Take 5 tablets (  20 mg total) by mouth once a week. Take on the day of chemo 20 tablet 3  . doxycycline (VIBRA-TABS) 100 MG tablet Take 100 mg by mouth as needed.    . metoprolol succinate (TOPROL-XL) 25 MG 24 hr tablet Take 1 tablet (25 mg total) by mouth daily. 15 tablet 0  . ondansetron (ZOFRAN) 8 MG tablet Take 8 mg by mouth 30 to 60 min prior to Cytoxan administration then take 8 mg twice daily as needed for nausea and vomiting. 30 tablet 1  . prochlorperazine (COMPAZINE) 10 MG tablet Take 1 tablet (10 mg total) by mouth every 6 (six) hours as needed (Nausea or vomiting). 30 tablet 1   No current facility-administered medications for this visit.     PHYSICAL EXAMINATION: ECOG PERFORMANCE STATUS: 1 - Symptomatic but completely ambulatory  Vitals:   05/24/19 0930  BP: 96/64  Pulse: 80  Resp: 18  Temp: 97.8 F (36.6 C)  SpO2: 100%   Filed Weights   05/24/19 0930  Weight: 116 lb 3.2 oz (52.7 kg)     GENERAL:alert, no distress and comfortable SKIN: skin color, texture, turgor are normal, no rashes or significant lesions EYES: normal, Conjunctiva are pink and non-injected, sclera clear  NECK: supple, thyroid normal size, non-tender, without nodularity LYMPH:  no palpable lymphadenopathy in the cervical, axillary  LUNGS: clear to auscultation and percussion with normal breathing effort HEART: regular rate & rhythm and no murmurs and no lower extremity edema ABDOMEN:abdomen soft, non-tender and normal bowel sounds Musculoskeletal:no cyanosis of digits and no clubbing  NEURO: alert & oriented x 3 with fluent speech, no focal motor/sensory deficits  LABORATORY DATA:  I have reviewed the data as listed CBC Latest Ref Rng & Units 05/24/2019 05/10/2019 04/26/2019  WBC 4.0 - 10.5 K/uL 4.1 3.5(L) 3.5(L)  Hemoglobin 12.0 - 15.0 g/dL 8.2(L) 9.1(L) 10.6(L)  Hematocrit 36.0 - 46.0 % 24.8(L) 27.4(L) 31.3(L)  Platelets 150 - 400 K/uL 130(L) 133(L) 171     CMP Latest Ref Rng & Units 05/24/2019 05/10/2019 04/26/2019  Glucose 70 - 99 mg/dL 130(H) 88 95  BUN 8 - 23 mg/dL _0 Creatinine 0.44 - 1.00 mg/dL 0.89 0.83 0.89  Sodium 135 - 145 mmol/L 142 139 138  Potassium 3.5 - 5.1 mmol/L 3.6 3.7 3.9  Chloride 98 - 111 mmol/L 110 105 106  CO2 22 - 32 mmol/L 19(L) 23 22  Calcium 8.9 - 10.3 mg/dL 9.0 9.2 8.9  Total Protein 6.5 - 8.1 g/dL 6.5 6.5 6.3(L)  Total Bilirubin 0.3 - 1.2 mg/dL 0.7 0.7 0.7  Alkaline Phos 38 - 126 U/L 70 45 43  AST 15 - 41 U/L 62(H) 19 19  ALT 0 - 44 U/L 58(H) 15 15      RADIOGRAPHIC STUDIES: I have personally reviewed the radiological images as listed and agreed with the findings in the report. No results found.   ASSESSMENT & PLAN:  Shannon Rush is a 78 y.o. female with   1. IgG kappa Multiple myeloma s/p Auto SCT, intermediate risk, relapsed in 01/2016 -Diagnosed in 05/2012. Treated with induction chemo, radiation and BM transplant, but unfortunately progressed  while on maintenance Revlimid 01/2016. She is currently onDaratumumabinfusion monotherapy.  -PETimagesfrom 04/09/19 showedScattered lytic lesions of bone. A few of these demonstrate hypermetabolic activity, most notably the right occipital bone lesion, C7 spinous process, Left 4th rib, andright femoral head.  -Her bone marrow biopsy from 04/02/19 which showsnormocellular marrow with minimal involvement by plasma cell neoplasm(2%).  -  she has also developed worsening anemia, and mild thrombocytopenia, which is related to her disease progression -Based on Dr. Noah Delaine recommendation I will change her treatment to CyBorD (oral cyclophosphamide 54m, Velcade 1.355mm2, and dexa 2050mweekly) starting today 05/24/19.  -Potential side effect from the chemotherapy, especially cytopenias, need for blood transfusion, risk of infections, nausea, diarrhea, kidney and renal dysfunction, cardiomyopathy, etc., were discussed with her in detail.  She voiced good understanding and agrees to proceed.  Took the first dose cyclophosphamide and dexamethasone at home this morning. -Labs reviewed with slightly elevated AST/ALT and adequate to proceed with Velcade today.  -F/u in 2 weeks with NP Lacie   2. Left petrous apex bone lesion with hearing loss  -Her MRI brain from 01/07/19 showedabnormal osseous lesion measuring up to approximately 5.9 cm centered at the left petrous apex. Thismost likely is the cause of her underlyingleft ear and balancesymptoms. -Given she is symptomatic, sheunderwent target RT with Dr ManTammi Klippel1-7/15. This is negative on 04/09/19 PET scan -Symptoms persist, she has stable muffled hearing and mild imbalanceissue.  3. Right Occipital skull Lesion with tenderness  -She has bulge on prior exam at right occipital skull with tenderness -Her PET from 04/09/19 shows lesion with hypermetabolic activity  in this area concerning for MM progression here.  -She recently discussed with Dr.  ManTammi Klippelout palliative radiation to this area, and is scheduled to start later this week.  However the radiation schedule does not work for her due to severe coming holiday and she would like to postpone.  I discussed with Dr ManTammi Klippelday, given she is starting new systemic therapy, and relatively mild symptoms, will hold radiation for now.  4. History of multiple pneumonia andbronchitis -She has had multiple episodes of pneumonia, required hospitalization,and bronchitis, required antibiotics -She has recovered well, will continue monitoring. I encouraged her to do spirometry,and exercise. -Her vaccines are up-to-date.  5. Pancytopenia -She mainly has anemia and Thrombocytopenia concerning for recent (03/2019) disease progression.   6. CHF, CAD, inferior STEMI in 02/2014, HLD -She is on ASA, BB, statin. She will continue to f/u with Dr. NelMeda CoffeePwell controlled and normal lately. -recent echo showed normal EF,will continue monitoring   7. Osteoporosis with multiple fractures -Last DEXA in 2015 showed worsening osteoporosis. She declined repeat DEXA. -She had a fall induced fracture in 01/2018. -She was on Zometa for 4 years, stopped after April 2018  8. Peripheral neuropathy in feet, G1-2 -Secondary to previous chemo. -She previously declined Gabapentin  -I will monitor, stable   9. Goal of care discussion  -she is full code now   Plan -Labs reviewed and adequate to proceed with Velcade today -Lab and Velcade in 1, 2 3 weeks -Continue Cytoxan 500m11md Dexa 20mg66mkly on days of treatment.  -F/u with NP Lacie in 2 weeks  -I spoke with Dr. ManniTammi Klippely and will cancel her radiation     No problem-specific Assessment & Plan notes found for this encounter.   No orders of the defined types were placed in this encounter.  All questions were answered. The patient knows to call the clinic with any problems, questions or concerns. No barriers to learning  was detected. I spent 25 minutes counseling the patient face to face. The total time spent in the appointment was 30 minutes and more than 50% was on counseling and review of test results     Yan FTruitt Merle11/03/2019   I, AmoyaJoslyn Devonacting as scribe for [employer]  Burr Medico, MD.   I have reviewed the above documentation for accuracy and completeness, and I agree with the above.

## 2019-05-21 NOTE — Telephone Encounter (Signed)
Shannon Rush, Please let Shannon Rush know that Dr. Tammi Klippel does not think additional XRT to the left sided lesion will improve her muffled hearing, could even worsen it.... Let her know that I am going to take a look in her ear when she comes for CT SIM next week to see if there is a lot of fluid accumulation from congestion of the eustachian tube or evidence of hardened/cerumen impaction and if so, I will get her a referral to ENT to discuss management, possibly with placement of tympanostomy tube to relieve fluid build up. Shannon Rush

## 2019-05-21 NOTE — Telephone Encounter (Signed)
Phoned patient back. No answer. Relayed the following per Ashlyng Bruning, PA-C over voicemail. Dr. Tammi Klippel does not think additional XRT to the left sided lesion will improve her muffled hearing, could even worsen it.... Let her know that I am going to take a look in her ear when she comes for CT SIM next week to see if there is a lot of fluid accumulation from congestion of the eustachian tube or evidence of hardened/cerumen impaction and if so, I will get her a referral to ENT to discuss management, possibly with placement of tympanostomy tube to relieve fluid build up. Provided my contact number for future questions.

## 2019-05-24 ENCOUNTER — Inpatient Hospital Stay: Payer: Medicare Other | Attending: Hematology

## 2019-05-24 ENCOUNTER — Other Ambulatory Visit: Payer: Self-pay

## 2019-05-24 ENCOUNTER — Inpatient Hospital Stay: Payer: Medicare Other

## 2019-05-24 ENCOUNTER — Inpatient Hospital Stay: Payer: Medicare Other | Admitting: Hematology

## 2019-05-24 ENCOUNTER — Telehealth: Payer: Self-pay | Admitting: Pharmacist

## 2019-05-24 VITALS — BP 96/64 | HR 80 | Temp 97.8°F | Resp 18 | Ht 66.0 in | Wt 116.2 lb

## 2019-05-24 DIAGNOSIS — Z7982 Long term (current) use of aspirin: Secondary | ICD-10-CM | POA: Insufficient documentation

## 2019-05-24 DIAGNOSIS — Z5112 Encounter for antineoplastic immunotherapy: Secondary | ICD-10-CM | POA: Insufficient documentation

## 2019-05-24 DIAGNOSIS — E785 Hyperlipidemia, unspecified: Secondary | ICD-10-CM | POA: Diagnosis not present

## 2019-05-24 DIAGNOSIS — Z9221 Personal history of antineoplastic chemotherapy: Secondary | ICD-10-CM | POA: Diagnosis not present

## 2019-05-24 DIAGNOSIS — Z923 Personal history of irradiation: Secondary | ICD-10-CM | POA: Diagnosis not present

## 2019-05-24 DIAGNOSIS — D61818 Other pancytopenia: Secondary | ICD-10-CM | POA: Insufficient documentation

## 2019-05-24 DIAGNOSIS — C9002 Multiple myeloma in relapse: Secondary | ICD-10-CM

## 2019-05-24 DIAGNOSIS — Z9481 Bone marrow transplant status: Secondary | ICD-10-CM | POA: Insufficient documentation

## 2019-05-24 DIAGNOSIS — M818 Other osteoporosis without current pathological fracture: Secondary | ICD-10-CM

## 2019-05-24 DIAGNOSIS — G629 Polyneuropathy, unspecified: Secondary | ICD-10-CM | POA: Insufficient documentation

## 2019-05-24 DIAGNOSIS — Z79899 Other long term (current) drug therapy: Secondary | ICD-10-CM | POA: Diagnosis not present

## 2019-05-24 DIAGNOSIS — C9000 Multiple myeloma not having achieved remission: Secondary | ICD-10-CM

## 2019-05-24 DIAGNOSIS — I252 Old myocardial infarction: Secondary | ICD-10-CM | POA: Diagnosis not present

## 2019-05-24 LAB — CBC WITH DIFFERENTIAL (CANCER CENTER ONLY)
Abs Immature Granulocytes: 0.04 10*3/uL (ref 0.00–0.07)
Basophils Absolute: 0 10*3/uL (ref 0.0–0.1)
Basophils Relative: 1 %
Eosinophils Absolute: 0.1 10*3/uL (ref 0.0–0.5)
Eosinophils Relative: 2 %
HCT: 24.8 % — ABNORMAL LOW (ref 36.0–46.0)
Hemoglobin: 8.2 g/dL — ABNORMAL LOW (ref 12.0–15.0)
Immature Granulocytes: 1 %
Lymphocytes Relative: 7 %
Lymphs Abs: 0.3 10*3/uL — ABNORMAL LOW (ref 0.7–4.0)
MCH: 36.1 pg — ABNORMAL HIGH (ref 26.0–34.0)
MCHC: 33.1 g/dL (ref 30.0–36.0)
MCV: 109.3 fL — ABNORMAL HIGH (ref 80.0–100.0)
Monocytes Absolute: 0.2 10*3/uL (ref 0.1–1.0)
Monocytes Relative: 5 %
Neutro Abs: 3.5 10*3/uL (ref 1.7–7.7)
Neutrophils Relative %: 84 %
Platelet Count: 130 10*3/uL — ABNORMAL LOW (ref 150–400)
RBC: 2.27 MIL/uL — ABNORMAL LOW (ref 3.87–5.11)
RDW: 15.4 % (ref 11.5–15.5)
WBC Count: 4.1 10*3/uL (ref 4.0–10.5)
nRBC: 0 % (ref 0.0–0.2)

## 2019-05-24 LAB — CMP (CANCER CENTER ONLY)
ALT: 58 U/L — ABNORMAL HIGH (ref 0–44)
AST: 62 U/L — ABNORMAL HIGH (ref 15–41)
Albumin: 3.6 g/dL (ref 3.5–5.0)
Alkaline Phosphatase: 70 U/L (ref 38–126)
Anion gap: 13 (ref 5–15)
BUN: 21 mg/dL (ref 8–23)
CO2: 19 mmol/L — ABNORMAL LOW (ref 22–32)
Calcium: 9 mg/dL (ref 8.9–10.3)
Chloride: 110 mmol/L (ref 98–111)
Creatinine: 0.89 mg/dL (ref 0.44–1.00)
GFR, Est AFR Am: >60 mL/min (ref >60)
GFR, Estimated: >60 mL/min (ref >60)
Glucose, Bld: 130 mg/dL — ABNORMAL HIGH (ref 70–99)
Potassium: 3.6 mmol/L (ref 3.5–5.1)
Sodium: 142 mmol/L (ref 135–145)
Total Bilirubin: 0.7 mg/dL (ref 0.3–1.2)
Total Protein: 6.5 g/dL (ref 6.5–8.1)

## 2019-05-24 MED ORDER — BORTEZOMIB CHEMO SQ INJECTION 3.5 MG (2.5MG/ML)
1.3000 mg/m2 | Freq: Once | INTRAMUSCULAR | Status: AC
  Administered 2019-05-24: 2 mg via SUBCUTANEOUS
  Filled 2019-05-24: qty 0.8

## 2019-05-24 NOTE — Progress Notes (Signed)
Pt verbalized that "I took my steroid and anti-nausea medication this morning" prior to coming to [facility]

## 2019-05-24 NOTE — Patient Instructions (Signed)
[facility] Discharge Instructions for Patients Receiving Chemotherapy  Today you received the following chemotherapy agents: Velcade.  To help prevent nausea and vomiting after your treatment, we encourage you to take your nausea medication as directed.   If you develop nausea and vomiting that is not controlled by your nausea medication, call the clinic.   BELOW ARE SYMPTOMS THAT SHOULD BE REPORTED IMMEDIATELY:  *FEVER GREATER THAN 100.5 F  *CHILLS WITH OR WITHOUT FEVER  NAUSEA AND VOMITING THAT IS NOT CONTROLLED WITH YOUR NAUSEA MEDICATION  *UNUSUAL SHORTNESS OF BREATH  *UNUSUAL BRUISING OR BLEEDING  TENDERNESS IN MOUTH AND THROAT WITH OR WITHOUT PRESENCE OF ULCERS  *URINARY PROBLEMS  *BOWEL PROBLEMS  UNUSUAL RASH Items with * indicate a potential emergency and should be followed up as soon as possible.  Feel free to call the clinic should you have any questions or concerns. The clinic phone number is [phone].  Please show the CHEMO ALERT CARD at check-in to the Emergency Department and triage nurse.  Bortezomib injection What is this medicine? BORTEZOMIB (bor TEZ oh mib) is a medicine that targets proteins in cancer cells and stops the cancer cells from growing. It is used to treat multiple myeloma and mantle-cell lymphoma. This medicine may be used for other purposes; ask your health care provider or pharmacist if you have questions. COMMON BRAND NAME(S): Velcade What should I tell my health care provider before I take this medicine? They need to know if you have any of these conditions:  diabetes  heart disease  irregular heartbeat  liver disease  on hemodialysis  low blood counts, like low white blood cells, platelets, or hemoglobin  peripheral neuropathy  taking medicine for blood pressure  an unusual or allergic reaction to bortezomib, mannitol, boron, other medicines, foods, dyes, or preservatives  pregnant or trying to get  pregnant  breast-feeding How should I use this medicine? This medicine is for injection into a vein or for injection under the skin. It is given by a health care professional in a hospital or clinic setting. Talk to your pediatrician regarding the use of this medicine in children. Special care may be needed. Overdosage: If you think you have taken too much of this medicine contact a poison control center or emergency room at once. NOTE: This medicine is only for you. Do not share this medicine with others. What if I miss a dose? It is important not to miss your dose. Call your doctor or health care professional if you are unable to keep an appointment. What may interact with this medicine? This medicine may interact with the following medications:  ketoconazole  rifampin  ritonavir  St. John's Wort This list may not describe all possible interactions. Give your health care provider a list of all the medicines, herbs, non-prescription drugs, or dietary supplements you use. Also tell them if you smoke, drink alcohol, or use illegal drugs. Some items may interact with your medicine. What should I watch for while using this medicine? You may get drowsy or dizzy. Do not drive, use machinery, or do anything that needs mental alertness until you know how this medicine affects you. Do not stand or sit up quickly, especially if you are an older patient. This reduces the risk of dizzy or fainting spells. In some cases, you may be given additional medicines to help with side effects. Follow all directions for their use. Call your doctor or health care professional for advice if you get a fever,   chills or sore throat, or other symptoms of a cold or flu. Do not treat yourself. This drug decreases your body's ability to fight infections. Try to avoid being around people who are sick. This medicine may increase your risk to bruise or bleed. Call your doctor or health care professional if you notice any  unusual bleeding. You may need blood work done while you are taking this medicine. In some patients, this medicine may cause a serious brain infection that may cause death. If you have any problems seeing, thinking, speaking, walking, or standing, tell your doctor right away. If you cannot reach your doctor, urgently seek other source of medical care. Check with your doctor or health care professional if you get an attack of severe diarrhea, nausea and vomiting, or if you sweat a lot. The loss of too much body fluid can make it dangerous for you to take this medicine. Do not become pregnant while taking this medicine or for at least 7 months after stopping it. Women should inform their doctor if they wish to become pregnant or think they might be pregnant. Men should not father a child while taking this medicine and for at least 4 months after stopping it. There is a potential for serious side effects to an unborn child. Talk to your health care professional or pharmacist for more information. Do not breast-feed an infant while taking this medicine or for 2 months after stopping it. This medicine may interfere with the ability to have a child. You should talk with your doctor or health care professional if you are concerned about your fertility. What side effects may I notice from receiving this medicine? Side effects that you should report to your doctor or health care professional as soon as possible:  allergic reactions like skin rash, itching or hives, swelling of the face, lips, or tongue  breathing problems  changes in hearing  changes in vision  fast, irregular heartbeat  feeling faint or lightheaded, falls  pain, tingling, numbness in the hands or feet  right upper belly pain  seizures  swelling of the ankles, feet, hands  unusual bleeding or bruising  unusually weak or tired  vomiting  yellowing of the eyes or skin Side effects that usually do not require medical  attention (report to your doctor or health care professional if they continue or are bothersome):  changes in emotions or moods  constipation  diarrhea  loss of appetite  headache  irritation at site where injected  nausea This list may not describe all possible side effects. Call your doctor for medical advice about side effects. You may report side effects to FDA at 1-800-FDA-1088. Where should I keep my medicine? This drug is given in a hospital or clinic and will not be stored at home. NOTE: This sheet is a summary. It may not cover all possible information. If you have questions about this medicine, talk to your doctor, pharmacist, or health care provider.  2020 [employer]vier/Gold Standard (2017-11-10 16:29:31)  

## 2019-05-24 NOTE — Telephone Encounter (Signed)
Oral Oncology Pharmacist Encounter  Received voicemail from patient with request for clarification of medication sequencing for her first day of cyclophosphamide, bortezomib, and dexamethasone. CyBorD start date: 05/24/2019  I returned call to patient's cell phone, which was the phone number requested for return call, stating that patient should take her dexamethasone 4 mg tablets, 5 tablets (20 mg) by mouth with breakfast. Patient should take ondansetron 8 mg by mouth 30 to 60 minutes prior to cyclophosphamide administration. Patient should take her cyclophosphamide in the morning with her dexamethasone and breakfast as well.  Patient reminded that prescriptions at [facility] on [address] had directions for her dexamethasone, antiemetic breakthrough medication of prochlorperazine, as well as ondansetron prescription that did have information about taking prior to cyclophosphamide.  Direct dial to oral oncology clinic left on patient's voicemail.  Johny Drilling, PharmD, BCPS, BCOP  05/24/2019 8:20 AM Oral Oncology Clinic [phone]

## 2019-05-25 ENCOUNTER — Ambulatory Visit: Payer: Medicare Other | Admitting: Radiation Oncology

## 2019-05-25 ENCOUNTER — Encounter: Payer: Self-pay | Admitting: Hematology

## 2019-05-25 ENCOUNTER — Telehealth: Payer: Self-pay | Admitting: Hematology

## 2019-05-25 NOTE — Telephone Encounter (Signed)
Scheduled appt per 11/9 los.  Left a VM of the appt date and time,.

## 2019-05-25 NOTE — Telephone Encounter (Signed)
Oral Oncology Pharmacist Encounter  Confirmed with the [facility] that cyclophosphamide was picked up on 05/24/19 for copayment $47.24.  Johny Drilling, PharmD, BCPS, BCOP  05/25/2019   7:52 AM Oral Oncology Clinic [phone]

## 2019-05-26 ENCOUNTER — Telehealth: Payer: Self-pay

## 2019-05-26 NOTE — Telephone Encounter (Signed)
Spoke with patient to let her know that if she is going to travel to [facility] and will get Velcade injections that the prior authorization will have to obtained at [facility].  She verbalized an understanding.

## 2019-05-27 ENCOUNTER — Ambulatory Visit: Payer: Medicare Other

## 2019-05-28 ENCOUNTER — Ambulatory Visit: Payer: Medicare Other

## 2019-05-31 ENCOUNTER — Inpatient Hospital Stay: Payer: Medicare Other

## 2019-05-31 ENCOUNTER — Ambulatory Visit: Payer: Medicare Other

## 2019-05-31 ENCOUNTER — Inpatient Hospital Stay (HOSPITAL_BASED_OUTPATIENT_CLINIC_OR_DEPARTMENT_OTHER): Payer: Medicare Other | Admitting: Hematology

## 2019-05-31 ENCOUNTER — Other Ambulatory Visit: Payer: Self-pay

## 2019-05-31 VITALS — BP 114/61 | HR 93 | Temp 98.2°F | Resp 16 | Wt 118.5 lb

## 2019-05-31 DIAGNOSIS — C9002 Multiple myeloma in relapse: Secondary | ICD-10-CM

## 2019-05-31 DIAGNOSIS — I252 Old myocardial infarction: Secondary | ICD-10-CM | POA: Diagnosis not present

## 2019-05-31 DIAGNOSIS — Z79899 Other long term (current) drug therapy: Secondary | ICD-10-CM | POA: Diagnosis not present

## 2019-05-31 DIAGNOSIS — Z923 Personal history of irradiation: Secondary | ICD-10-CM | POA: Diagnosis not present

## 2019-05-31 DIAGNOSIS — Z9221 Personal history of antineoplastic chemotherapy: Secondary | ICD-10-CM | POA: Diagnosis not present

## 2019-05-31 DIAGNOSIS — Z7982 Long term (current) use of aspirin: Secondary | ICD-10-CM | POA: Diagnosis not present

## 2019-05-31 DIAGNOSIS — D61818 Other pancytopenia: Secondary | ICD-10-CM | POA: Diagnosis not present

## 2019-05-31 DIAGNOSIS — Z9481 Bone marrow transplant status: Secondary | ICD-10-CM | POA: Diagnosis not present

## 2019-05-31 DIAGNOSIS — T451X5A Adverse effect of antineoplastic and immunosuppressive drugs, initial encounter: Secondary | ICD-10-CM

## 2019-05-31 DIAGNOSIS — Z5112 Encounter for antineoplastic immunotherapy: Secondary | ICD-10-CM | POA: Diagnosis not present

## 2019-05-31 DIAGNOSIS — C9000 Multiple myeloma not having achieved remission: Secondary | ICD-10-CM | POA: Diagnosis not present

## 2019-05-31 DIAGNOSIS — G629 Polyneuropathy, unspecified: Secondary | ICD-10-CM | POA: Diagnosis not present

## 2019-05-31 DIAGNOSIS — D6481 Anemia due to antineoplastic chemotherapy: Secondary | ICD-10-CM

## 2019-05-31 DIAGNOSIS — E785 Hyperlipidemia, unspecified: Secondary | ICD-10-CM | POA: Diagnosis not present

## 2019-05-31 LAB — CMP (CANCER CENTER ONLY)
ALT: 63 U/L — ABNORMAL HIGH (ref 0–44)
AST: 28 U/L (ref 15–41)
Albumin: 3.5 g/dL (ref 3.5–5.0)
Alkaline Phosphatase: 80 U/L (ref 38–126)
Anion gap: 12 (ref 5–15)
BUN: 14 mg/dL (ref 8–23)
CO2: 19 mmol/L — ABNORMAL LOW (ref 22–32)
Calcium: 8.6 mg/dL — ABNORMAL LOW (ref 8.9–10.3)
Chloride: 107 mmol/L (ref 98–111)
Creatinine: 0.81 mg/dL (ref 0.44–1.00)
GFR, Est AFR Am: >60 mL/min (ref >60)
GFR, Estimated: >60 mL/min (ref >60)
Glucose, Bld: 167 mg/dL — ABNORMAL HIGH (ref 70–99)
Potassium: 3.6 mmol/L (ref 3.5–5.1)
Sodium: 138 mmol/L (ref 135–145)
Total Bilirubin: 0.6 mg/dL (ref 0.3–1.2)
Total Protein: 6.5 g/dL (ref 6.5–8.1)

## 2019-05-31 LAB — CBC WITH DIFFERENTIAL (CANCER CENTER ONLY)
Abs Immature Granulocytes: 0.06 10*3/uL (ref 0.00–0.07)
Basophils Absolute: 0 10*3/uL (ref 0.0–0.1)
Basophils Relative: 1 %
Eosinophils Absolute: 0.1 10*3/uL (ref 0.0–0.5)
Eosinophils Relative: 3 %
HCT: 23.5 % — ABNORMAL LOW (ref 36.0–46.0)
Hemoglobin: 7.7 g/dL — ABNORMAL LOW (ref 12.0–15.0)
Immature Granulocytes: 2 %
Lymphocytes Relative: 8 %
Lymphs Abs: 0.2 10*3/uL — ABNORMAL LOW (ref 0.7–4.0)
MCH: 36 pg — ABNORMAL HIGH (ref 26.0–34.0)
MCHC: 32.8 g/dL (ref 30.0–36.0)
MCV: 109.8 fL — ABNORMAL HIGH (ref 80.0–100.0)
Monocytes Absolute: 0.1 10*3/uL (ref 0.1–1.0)
Monocytes Relative: 5 %
Neutro Abs: 2.6 10*3/uL (ref 1.7–7.7)
Neutrophils Relative %: 81 %
Platelet Count: 115 10*3/uL — ABNORMAL LOW (ref 150–400)
RBC: 2.14 MIL/uL — ABNORMAL LOW (ref 3.87–5.11)
RDW: 15.4 % (ref 11.5–15.5)
WBC Count: 3.1 10*3/uL — ABNORMAL LOW (ref 4.0–10.5)
nRBC: 0 % (ref 0.0–0.2)

## 2019-05-31 LAB — PREPARE RBC (CROSSMATCH)

## 2019-05-31 MED ORDER — BORTEZOMIB CHEMO SQ INJECTION 3.5 MG (2.5MG/ML)
1.0000 mg/m2 | Freq: Once | INTRAMUSCULAR | Status: AC
  Administered 2019-05-31: 1.5 mg via SUBCUTANEOUS
  Filled 2019-05-31: qty 0.6

## 2019-05-31 NOTE — Progress Notes (Signed)
Pt having increased fatigue, neuropathy in feet.  Dr Burr Medico saw pt & reduced dose of Velcade. Pt took Cytoxan, Decadron, & nausea med at home @ 7:45 am today.  Pt to receive blood this week.

## 2019-05-31 NOTE — Progress Notes (Signed)
[place]   [phone] [phone]   Clinic Follow up Note   Patient Care Team: [name], MD as PCP - General (Family Medicine) [name], MD as PCP - Cardiology (Cardiology) [name], MD as Attending Physician (Obstetrics and Gynecology) [name], MD as Consulting Physician (Internal Medicine) [name], MD as Consulting Physician (Hematology) [name], MD as Consulting Physician (Ophthalmology) [name], MD as Consulting Physician (Cardiology) [name], MD as Consulting Physician (Dermatology) [name] (Dentistry) [name], DPM as Consulting Physician (Podiatry) Pleasant, Eppie Gibson, RN as [facility]  Date of Service:  05/31/2019  CHIEF COMPLAINT: worsening neuropathy   SUMMARY OF ONCOLOGIC HISTORY: Oncology History Overview Note  Multiple myeloma [facility])   Staging form: Multiple Myeloma, AJCC 6th Edition   - Clinical: Stage IIIA - Signed by [name], MD on 09/04/2013    Multiple myeloma ([place])  05/26/2012 Initial Diagnosis   Presenting IgG was 4,040 mg/dL on 06/05/2012 (IgA 35; IgM 34); kappa free light chain 34.4 mg/dL, lambda 0.00, kappa:lambda ratio of 34.75; SPEP with M-spike of 2.60.   06/30/2012 Imaging   Numerous lytic lesions throughout the calvarium.  Lytic lesion within the left lateral scapula.  Findings most compatible with metastases or myeloma. Multiple mid and lower thoracic compression fractures.  Slight compression through the endplates at L3.   76/72/0947 Bone Marrow Biopsy   Bone marrow biopsy showed 49% plasma cell. Normal classical cytogenetics; however, myeloma FISH panel showed 13q- (intermediate risk)       07/14/2012 - 07/27/2012 Radiation Therapy   Palliative radiation 20 Gy over 10 fractions between to thoracic spine cord compression and symptomatic left scapula lytic lesion.   08/10/2012 - 11/2012  Chemotherapy   Started SQ Velcade once weekly, 3 weeks on, 1 weeks off; daily Revlimid d1-21, 7 days off; and Dexamethasone 49m PO weekly.    02/12/2013 Bone Marrow Transplant   [facility]    06/14/2013 Tumor Marker   IgG  1830,  Kappa:lamba ratio, 1.69 (baseline was 34.75 or   M-spike 0.28 (baseline of 2.6 or  89.3% of baseline).     06/25/2013 -  Chemotherapy   Started zometa 3.5 mg monthly    09/01/2013 - 01/2014 Chemotherapy   Maintenance therapy with revlimid 532mdaily for 14 days and then off for 7 (decreased from 21 days on and 7 days off based on neutropenia). Stopped due to disease progression 01/2017   09/13/2013 - 09/17/2014 Hospital Admission   Hospital admission for pneumonia   12/20/2013 Treatment Plan Change   Maintenance therapy decreased to 2.5 mg daily for 21 days and then off for 7 days based on low counts.    01/25/2014 Tumor Marker   IgG 1360 M spike 0.5   03/01/2014 - 03/05/2014 Hospital Ad[facility] received [facility] therapy, cath showed 60% prox RCA, mid-distal RCA 50%, Echo normal, no stents.   04/11/2014 - 08/07/2014 Chemotherapy   Continue Revlimid at 2.5 mg 3 weeks on 1 week off   08/08/2014 - 08/13/2014 Hospital Admission   Hospital admission for pneumonia. Her Revlimid was held.   08/29/2014 - 09/14/2014 Chemotherapy   Maintenance Revlimid restarted   09/14/2014 - 09/17/2014 Hospital Admission   [place].   11/13/2014 - 12/25/2015 Chemotherapy   Maintenance Revlimid restarted,  to 2.5mg daily, 2 weks on, 1 week off from 12/15/2014, stopped due to disease progression  °  °01/24/2016 Progression  ° Patient is M protein has gradually increased to 1.0g, repeated a bone marrow biopsy showed plasma cell 10-30% °  °02/27/2016 - 03/07/2016 Chemotherapy  ° Pomalidomide 4 mg daily on day 1-21, dexamethasone  40 mg on day 1, 8 and 15, every 28 days, started on 02/27/2016, daratumumab weekly started on 03/07/2016, held after first dose, due to hospitalization and a severe pancytopenia, pomalidomide was stopped afterwards  °  °03/10/2016 - 03/29/2016 Hospital Admission  ° Pt was admitted for sepsis from pneumonia, and severe pancytopenia. She required ICU stay for a few days due to hypotension, developed b/l pleural effusion required diuretics and b/l thoracentesis. She also required blood and plt transfusion, and prolonged neupogen injection for severe neutropenia. She was discharged home after 3 weeks hospital stay  °  °04/26/2016 - 04/26/2019 Chemotherapy  ° Daratumumab restarted on 04/26/2016, Velcade 1.3mg/m2 and dexa 20mg weekly start on 11/3, velcade held since 06/28/2016 due to CHF, dexa stopped on12/11/2017 due to high risk of fracture.  °Dara reduced to every 4 weeks due to COVID-19 starting 10/23/18. Returned to every 3 weeks starting 12/17/18. Restarted every 2 weeks starting 02/22/19 °Stopped on 04/26/19 due to disease progression.  °  °08/26/2016 Echocardiogram  ° - Left ventricle: LVEF is approximately 35% with diffuse diffuse °  hypokinesis with severe hypokinesis/ akinesis of the °  inferior/inferoseptal walls. In comparison to echo images from °  December 2017, there does not appear to be significant change The °  cavity size was normal. Wall thickness was normal. Doppler °  parameters are consistent with abnormal left ventricular °  relaxation (grade 1 diastolic dysfunction). °- Aortic valve: AV is thickened. There is a small mobile °  echodenisity on ventricular surface consistent with possible °  fibroelastoma. Present in echo from December 2017 There was °  trivial regurgitation. °- Right ventricle: Systolic function was mildly reduced. °- Right atrium: The atrium was mildly dilated. °- Pericardium, extracardiac: A trivial pericardial effusion was °  identified. °  °12/03/2016 Echocardiogram  ° -Left  Ventricle: Systolic function was °  mildly to moderately reduced. The estimated ejection fraction was °  in the range of 40% to 45%. Diffuse hypokinesis. Doppler °  parameters are consistent with abnormal left ventricular °  relaxation (grade 1 diastolic dysfunction). Doppler parameters °  are consistent with indeterminate ventricular filling pressure. °- Left atrium: The atrium was severely dilated. °- Tricuspid valve: There was trivial regurgitation. °- Pulmonary arteries: Systolic pressure was within the normal °  range. PA peak pressure: 33 mm Hg (S). °- Global longitudinal strain -10.5% (abnormal) °  °03/12/2017 Echocardiogram  ° ECHO 03/12/17 °Study Conclusions ° - Left ventricle: The cavity size was normal. Wall thickness was °  increased in a pattern of mild LVH. Systolic function was normal. °  The estimated ejection fraction was in the range of 50% to 55%. °  There is hypokinesis of the basalinferior myocardium. Doppler °  parameters are consistent with abnormal left ventricular °  relaxation (grade 1 diastolic dysfunction). °- Aortic valve: There was trivial regurgitation. °- Mitral valve: Calcified annulus. Mildly thickened leaflets . °  There was trivial regurgitation. °- Tricuspid valve: There was mild regurgitation. ° Impressions: ° - There has been mild improvement in EF since prior study. ° °  °12/12/2017 - 12/15/2017 Hospital Admission  ° Admission diagnosis: Pnuemonia and acute hypoxic respiratory failure °Additional comments:   treated for pneumoania and acute hypoxic respiratory failure before she was discharged on 12/15/17 with oral anitbiotics. She did not require home oxygen.  °  °12/30/2017 - 01/02/2018 Hospital Admission  ° Admission diagnosis: Pnuemonia  °Additional comments: She was again hospitalized on 12/30/17 for pneumonia, ID work up was otherwise negative, she was discharged home with oral antibiotics.  °  °04/01/2018 Imaging  ° 04/01/2018 CXR ° IMPRESSION: °Persisting airspace disease  superimposed on chronic changes, °suggesting pneumonia given the history. Followup PA and lateral °chest X-ray is recommended in 3-4 weeks following therapy to ensure °resolution and exclude underlying malignancy. °  °Redemonstration of mid and lower thoracic compression fractures with °associated thoracic kyphosis. °  °04/23/2018 Echocardiogram  ° 04/23/2018 ECHO °LV EF: 45% -   50% °  °01/07/2019 Imaging  ° MRI Brain 01/07/19 °IMPRESSION: °MRI HEAD IMPRESSION: °  °1. 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 Lesion closely °approximates the left inner ear structures and involves the left °Meckel's cave and left fifth cranial nerve, and most likely is the °causative etiology for patient's underlying left ear and facial °symptoms. Follow-up examination with postcontrast imaging suggested °for complete evaluation. Inclusion of IAC sequences would likely be °helpful for visualization. °2. Additional 2.7 cm lesion involving the right occipital calvarium °with adjacent marrow signal abnormality, also likely related to °multiple myeloma. Metastatic disease would be the primary °differential consideration. °3. Underlying age-appropriate cerebral atrophy with mild chronic °microvascular ischemic disease. No other acute intracranial °abnormality. °  °MRA HEAD IMPRESSION: °  °1. Negative intracranial MRA with no large vessel occlusion, °hemodynamically significant stenosis, or other acute vascular °abnormality. °2. Petrous and cavernous left ICA is partially encased and °surrounded by the left petrous apex lesion without significant °irregularity or stenosis. °3. 2 mm right paraophthalmic aneurysm. °  °MRA NECK IMPRESSION: °  °Normal MRA of the neck with wide patency of the carotid and °vertebral arteries bilaterally. No hemodynamically significant °stenosis or other acute vascular abnormality  identified. ° °  °01/14/2019 - 01/27/2019 Radiation Therapy  ° Radaition to Left petrous apex bone lesion 01/14/19-01/27/19 with Dr. Manning.  °  °04/01/2019 Pathology Results  ° Bone Marrow biopsy 04/01/19  °DIAGNOSIS: °BONE MARROW, ASPIRATE, CLOT, CORE: °- Normocellular marrow with minimal involvement by plasma cell neoplasm °- See comment °PERIPHERAL BLOOD: °- Macrocytic anemia and leukopenia °-See complete blood count °  °04/09/2019 PET scan  ° PET 04/09/19  °IMPRESSION: °1. Scattered lytic lesions of bone. A few of these demonstrate °hypermetabolic activity, most notably the right occipital bone °lesion; the lytic lesion in the approximately C7 spinous process; °the lytic expansile lesion of the left fourth rib anteriorly; and a °small lytic focus in the right femoral head. Other scattered lytic °lesions have only low-grade activity. °2. Multiple fractures as detailed above. °3. Other imaging findings of potential clinical significance: Aortic °Atherosclerosis (ICD10-I70.0). Coronary atherosclerosis. °  °05/10/2019 -  Chemotherapy  ° Velcade q2weeeks and Dexa 20mg weekly was added on 05/10/19. Switched to CyBorD (Oral cyclophosphamide CYTOXAN 500mg, IV Velcade 1.3mg/m2, and oral dexa 20mg, weekly) starting 05/24/19. °  ° ° ° °CURRENT THERAPY:  °-CyBorD (cyclophosphamide 500mg (300mg/m2), Velcade 1.3mg/m2, and dexa 20mg, weekly) starting 05/24/19.  ° °INTERVAL HISTORY:  °Janille K Hoke came in today for second week treatment.  I was called to see her in the infusion room due to her worsening neuropathy.  She reports worsening numbness on her   her fingers and toes, along with mild tingling and burning sensation.  Her hand function is still normal, no balance issues or falls.  She otherwise tolerated first week treatment well last week, no significant nausea or diarrhea.  She feels more fatigued since last week, she was able to ride her bike to here today, but feels quite tired when she got here. No other new complaints, she  denies any signs of bleeding.  Review of system otherwise negative.  MEDICAL HISTORY:  Past Medical History:  Diagnosis Date   CAD (coronary artery disease)    a. 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 trivial by echo 06/2017.   OSTEOPOROSIS 06/11/2010   Multiple compression fractures; and spontaneous fracture of sternum Qualifier: Diagnosis of  By: [name], Susan      Thoracic kyphosis 07/13/12   per MRI scan   Unspecified deficiency anemia     SURGICAL HISTORY: Past Surgical History:  Procedure Laterality Date   APPENDECTOMY     CATARACT EXTRACTION, BILATERAL     CESAREAN SECTION     x2    COLONOSCOPY  2007   neg with Dr. [name]   ELBOW SURGERY     HIP SURGERY  2009   left   TUBAL LIGATION      I have reviewed the social history and family history with the patient and they are unchanged from previous note.  ALLERGIES:  is allergic to zithromax [azithromycin]; zosyn [piperacillin sod-tazobactam so]; and quinolones.  MEDICATIONS:  Current Outpatient Medications  Medication Sig Dispense Refill   acetaminophen (TYLENOL) 500 MG tablet Take 500 mg by mouth every 6 (six) hours as needed for moderate pain or fever.     acyclovir (ZOVIRAX) 400 MG tablet Take 1 tablet (400 mg total) by mouth 2 (two) times daily. 180 tablet 3   aspirin EC 81 MG tablet Take 1 tablet (81 mg total) by mouth daily. 90 tablet 3   atorvastatin (LIPITOR) 20 MG tablet Take 1 tablet (20 mg total) by  mouth daily. 10 tablet 0   Calcium Carbonate-Vit D-Min (CALCIUM 600+D PLUS MINERALS) 600-400 MG-UNIT TABS Take by mouth.     cyclophosphamide (CYTOXAN) 50 MG capsule Take 10 capsules (500 mg total) by mouth once a week. Take with food to minimize GI upset. Take early in the day and maintain hydration. 40 capsule 3   dexamethasone (DECADRON) 4 MG tablet Take 5 tablets (20 mg total) by mouth once a week. Take on the day of chemo 20 tablet 3   doxycycline (VIBRA-TABS) 100 MG tablet Take 100 mg by mouth as needed.     metoprolol succinate (TOPROL-XL) 25 MG 24 hr tablet Take 1 tablet (25 mg total) by mouth daily. 15 tablet 0   ondansetron (ZOFRAN) 8 MG tablet Take 8 mg by mouth 30 to 60 min prior to Cytoxan administration then take 8 mg twice daily as needed for nausea and vomiting. 30 tablet 1  COMPAZINE) 10 MG tablet Take 1 tablet (10 mg total) by mouth every 6 (six) hours as needed (Nausea or vomiting). 30 tablet 1  ° °No current facility-administered medications for this visit.   ° ° °PHYSICAL EXAMINATION: °ECOG PERFORMANCE STATUS: 2 °Blood pressure 106/59, heart rate 87, respirate 16, temperature 36.8, pulse ox 100% on room air °GENERAL:alert, no distress and comfortable °SKIN: skin color, texture, turgor are normal, no rashes or significant lesions °EYES: normal, Conjunctiva are pink and non-injected, sclera clear  °NECK: supple, thyroid normal size, non-tender, without nodularity °LYMPH:  no palpable lymphadenopathy in the cervical, axillary  °LUNGS: clear to auscultation and percussion with normal breathing effort °HEART: regular rate & rhythm and no murmurs and no lower extremity edema °ABDOMEN:abdomen soft, non-tender and normal bowel sounds °Musculoskeletal:no cyanosis of digits and no clubbing  °NEURO: alert & oriented x 3 with fluent speech ° °LABORATORY DATA:  °I have reviewed the data as listed °CBC Latest Ref Rng & Units 05/31/2019 05/24/2019 05/10/2019  °WBC 4.0 - 10.5  K/uL 3.1(L) 4.1 3.5(L)  °Hemoglobin 12.0 - 15.0 g/dL 7.7(L) 8.2(L) 9.1(L)  °Hematocrit 36.0 - 46.0 % 23.5(L) 24.8(L) 27.4(L)  °Platelets 150 - 400 K/uL 115(L) 130(L) 133(L)  ° ° ° °CMP Latest Ref Rng & Units 05/31/2019 05/24/2019 05/10/2019  °Glucose 70 - 99 mg/dL 167(H) 130(H) 88  °BUN 8 - 23 mg/dL 14 21 15  °Creatinine 0.44 - 1.00 mg/dL 0.81 0.89 0.83  °Sodium 135 - 145 mmol/L 138 142 139  °Potassium 3.5 - 5.1 mmol/L 3.6 3.6 3.7  °Chloride 98 - 111 mmol/L 107 110 105  °CO2 22 - 32 mmol/L 19(L) 19(L) 23  °Calcium 8.9 - 10.3 mg/dL 8.6(L) 9.0 9.2  °Total Protein 6.5 - 8.1 g/dL 6.5 6.5 6.5  °Total Bilirubin 0.3 - 1.2 mg/dL 0.6 0.7 0.7  °Alkaline Phos 38 - 126 U/L 80 70 45  °AST 15 - 41 U/L 28 62(H) 19  °ALT 0 - 44 U/L 63(H) 58(H) 15  ° ° ° ° °RADIOGRAPHIC STUDIES: °I have personally reviewed the radiological images as listed and agreed with the findings in the report. °No results found.  ° °ASSESSMENT & PLAN:  °Shannon Rush is a 78 y.o. female with  ° °1. IgG kappa Multiple myeloma s/p Auto SCT, intermediate risk, relapsed in 01/2016 and progressed again in Oct 2020 °-Diagnosed in 05/2012. Treated with induction chemo, radiation and BM transplant, but unfortunately progressed while on maintenance Revlimid 01/2016. She is currently on Daratumumab infusion monotherapy.  °-PET images from 04/09/19 showed Scattered lytic lesions of bone. A few of these demonstrate hypermetabolic activity, most notably the right occipital bone lesion, C7 spinous process, Left 4th rib, and right femoral head.  °-Her bone marrow biopsy from 04/02/19 which shows normocellular marrow with minimal involvement by plasma cell neoplasm (2%).  °-she has also developed worsening anemia, and mild thrombocytopenia, which is related to her disease progression °-Based on Dr. Gasperatto's recommendation I changed her treatment to CyBorD (oral cyclophosphamide 500mg, Velcade 1.3mg/m2, and dexa 20mg, weekly) started on 05/24/19.  °--Labs reviewed, she has  developed worsening anemia, symptomatic, will give 1 unit of blood transfusion tomorrow °-She has developed worsening neuropathy after 2 doses of Velcade, will decrease dose to 1mg/kg today °-her recent echo (three times in 2020) also showed normal EF. I recommend change Velcade to Kyprolis, due to her neuropathy.  We discussed side effects from kyphoscoliosis, especially heart toxicity, will monitor her echo every 3 months  °-She agrees to proceed. Will start next week  °  °2.   2. Left petrous apex bone lesion with hearing loss  -Her MRI brain from 01/07/19 showedabnormal osseous lesion measuring up to approximately 5.9 cm centered at the left petrous apex. Thismost likely is the cause of her underlyingleft ear and balancesymptoms. -Given she is symptomatic, sheunderwent target RT with Dr Tammi Klippel 7/1-7/15. This is negative on 04/09/19 PET scan -Symptoms persist, she has stable muffled hearing and mild imbalanceissue. -stable   3. Right Occipital skull Lesion with tenderness  -She has bulge on prior exam at right occipital skull with tenderness -Her PET from 04/09/19 shows lesion with hypermetabolic activity  in this area concerning for MM progression here.  -will hold on palliative radiation for now  4. History of multiple pneumonia andbronchitis -She has had multiple episodes of pneumonia, required hospitalization,and bronchitis, required antibiotics -She has recovered well, will continue monitoring. I encouraged her to do spirometry,and exercise. -Her vaccines are up-to-date.  5. Pancytopenia -She mainly has anemia and Thrombocytopenia concerning for recent (03/2019) disease progression.  -Her anemia has gotten worse lately due to her disease progression, will give 1 unit blood transfusion tomorrow  6. CHF, CAD, inferior STEMI in 02/2014, HLD -She is on ASA, BB, statin. She will continue to f/u with Dr. Meda Coffee -BPwell controlled and normal lately. -recent echo showed normal  EF,will continue monitoring   7. Osteoporosis with multiple fractures -Last DEXA in 2015 showed worsening osteoporosis. She declined repeat DEXA. -She had a fall induced fracture in 01/2018. -She was on Zometa for 4 years, stopped after April 2018  8. Peripheral neuropathy in feet, G2 -Secondary to previous chemo. -She previously declined Gabapentin  -Worse lately since she started Velcade, will change Velcade to Kyprolis which cause less neuropathy   9. Goal of care discussion  -she is full code now   Plan -Labs reviewed and adequate to proceed with Velcade today, will reduce the dose to 1 mg/kg, due to her neuropathy -We will change weekly Velcade to Kyprolis next week, first dose 36m/m2, then 78mkg on day 8 and 15, every 28 days if she tolerates well  -f/u weekly  -1u blood transfusion tomorrow    No problem-specific Assessment & Plan notes found for this encounter.   No orders of the defined types were placed in this encounter.  All questions were answered. The patient knows to call the clinic with any problems, questions or concerns. No barriers to learning was detected. I spent 20 minutes counseling the patient face to face. The total time spent in the appointment was 25 minutes and more than 50% was on counseling and review of test results     YaTruitt MerleMD 05/31/2019

## 2019-05-31 NOTE — Addendum Note (Signed)
Addended by: [name] on: 05/31/2019 10:15 AM   Modules accepted: Orders

## 2019-05-31 NOTE — Patient Instructions (Signed)
[place] Discharge Instructions for Patients Receiving Chemotherapy  Today you received the following chemotherapy agents:  Velcade/Bortezemab  To help prevent nausea and vomiting after your treatment, we encourage you to take your nausea medication as needed.   If you develop nausea and vomiting that is not controlled by your nausea medication, call the clinic.   BELOW ARE SYMPTOMS THAT SHOULD BE REPORTED IMMEDIATELY:  *FEVER GREATER THAN 100.5 F  *CHILLS WITH OR WITHOUT FEVER  NAUSEA AND VOMITING THAT IS NOT CONTROLLED WITH YOUR NAUSEA MEDICATION  *UNUSUAL SHORTNESS OF BREATH  *UNUSUAL BRUISING OR BLEEDING  TENDERNESS IN MOUTH AND THROAT WITH OR WITHOUT PRESENCE OF ULCERS  *URINARY PROBLEMS  *BOWEL PROBLEMS  UNUSUAL RASH Items with * indicate a potential emergency and should be followed up as soon as possible.  Feel free to call the clinic should you have any questions or concerns. The clinic phone number is [phone].  Please show the [place] at check-in to the Emergency Department and triage nurse.  Come back tomorrow for 2 u PRBC's @ 9am

## 2019-06-01 ENCOUNTER — Inpatient Hospital Stay: Payer: Medicare Other

## 2019-06-01 ENCOUNTER — Other Ambulatory Visit: Payer: Self-pay

## 2019-06-01 ENCOUNTER — Telehealth: Payer: Self-pay | Admitting: Hematology

## 2019-06-01 ENCOUNTER — Ambulatory Visit: Payer: Medicare Other

## 2019-06-01 ENCOUNTER — Telehealth: Payer: Self-pay

## 2019-06-01 ENCOUNTER — Telehealth: Payer: Self-pay | Admitting: *Deleted

## 2019-06-01 DIAGNOSIS — C9000 Multiple myeloma not having achieved remission: Secondary | ICD-10-CM | POA: Diagnosis not present

## 2019-06-01 DIAGNOSIS — D61818 Other pancytopenia: Secondary | ICD-10-CM | POA: Diagnosis not present

## 2019-06-01 DIAGNOSIS — Z9481 Bone marrow transplant status: Secondary | ICD-10-CM | POA: Diagnosis not present

## 2019-06-01 DIAGNOSIS — Z9221 Personal history of antineoplastic chemotherapy: Secondary | ICD-10-CM | POA: Diagnosis not present

## 2019-06-01 DIAGNOSIS — G629 Polyneuropathy, unspecified: Secondary | ICD-10-CM | POA: Diagnosis not present

## 2019-06-01 DIAGNOSIS — I252 Old myocardial infarction: Secondary | ICD-10-CM | POA: Diagnosis not present

## 2019-06-01 DIAGNOSIS — Z5112 Encounter for antineoplastic immunotherapy: Secondary | ICD-10-CM | POA: Diagnosis not present

## 2019-06-01 DIAGNOSIS — C9002 Multiple myeloma in relapse: Secondary | ICD-10-CM

## 2019-06-01 DIAGNOSIS — Z79899 Other long term (current) drug therapy: Secondary | ICD-10-CM | POA: Diagnosis not present

## 2019-06-01 DIAGNOSIS — Z923 Personal history of irradiation: Secondary | ICD-10-CM | POA: Diagnosis not present

## 2019-06-01 DIAGNOSIS — E785 Hyperlipidemia, unspecified: Secondary | ICD-10-CM | POA: Diagnosis not present

## 2019-06-01 DIAGNOSIS — Z7982 Long term (current) use of aspirin: Secondary | ICD-10-CM | POA: Diagnosis not present

## 2019-06-01 MED ORDER — ACETAMINOPHEN 325 MG PO TABS
650.0000 mg | ORAL_TABLET | Freq: Once | ORAL | Status: AC
  Administered 2019-06-01: 650 mg via ORAL

## 2019-06-01 MED ORDER — DIPHENHYDRAMINE HCL 25 MG PO CAPS
25.0000 mg | ORAL_CAPSULE | Freq: Once | ORAL | Status: AC
  Administered 2019-06-01: 25 mg via ORAL

## 2019-06-01 MED ORDER — DIPHENHYDRAMINE HCL 25 MG PO CAPS
ORAL_CAPSULE | ORAL | Status: AC
  Filled 2019-06-01: qty 1

## 2019-06-01 MED ORDER — ACETAMINOPHEN 325 MG PO TABS
ORAL_TABLET | ORAL | Status: AC
  Filled 2019-06-01: qty 2

## 2019-06-01 MED ORDER — SODIUM CHLORIDE 0.9% IV SOLUTION
250.0000 mL | Freq: Once | INTRAVENOUS | Status: AC
  Administered 2019-06-01: 250 mL via INTRAVENOUS
  Filled 2019-06-01: qty 250

## 2019-06-01 NOTE — Telephone Encounter (Signed)
No los per 11/16. °

## 2019-06-01 NOTE — Telephone Encounter (Signed)
Received call from [facility] stating that since pt has had Darzelex there are problems with matching blood.  They need an order from a physician to give least incompatible.  Informed Dr Julien Nordmann & verbal OK given to give least incompatible blood today.  Order repeated & verified & called & informed [facility].

## 2019-06-01 NOTE — Telephone Encounter (Signed)
Per Laverle Hobby to run blood at a rate of 120

## 2019-06-01 NOTE — Patient Instructions (Signed)

## 2019-06-02 ENCOUNTER — Ambulatory Visit: Payer: Medicare Other

## 2019-06-02 ENCOUNTER — Encounter: Payer: Self-pay | Admitting: Hematology

## 2019-06-02 LAB — TYPE AND SCREEN
ABO/RH(D): A POS
Antibody Screen: POSITIVE
DAT, IgG: NEGATIVE
Donor AG Type: NEGATIVE
Donor AG Type: NEGATIVE
Unit division: 0
Unit division: 0

## 2019-06-02 LAB — BPAM RBC
Blood Product Expiration Date: 202012142359
Blood Product Expiration Date: 202012142359
ISSUE DATE / TIME: 202011171058
ISSUE DATE / TIME: 202011171058
Unit Type and Rh: 5100
Unit Type and Rh: 5100

## 2019-06-02 MED ORDER — ONDANSETRON HCL 8 MG PO TABS: 8.0000 mg | ORAL_TABLET | Freq: Three times a day (TID) | ORAL | 3 refills | Status: AC | PRN

## 2019-06-02 NOTE — Progress Notes (Signed)
ON PATHWAY REGIMEN - Multiple Myeloma and Other Plasma Cell Dyscrasias  No Change  Continue With Treatment as Ordered.     A cycle is every 28 days:     Cyclophosphamide      Dexamethasone      Bortezomib   **Always confirm dose/schedule in your pharmacy ordering system**  Patient Characteristics: Relapsed / Refractory, Second through Fourth Lines of Therapy R-ISS Staging: Unknown Disease Classification: Refractory Line of Therapy: Fourth Line Intent of Therapy: Non-Curative / Palliative Intent, Discussed with Patient

## 2019-06-02 NOTE — Progress Notes (Signed)
DISCONTINUE ON PATHWAY REGIMEN - Multiple Myeloma and Other Plasma Cell Dyscrasias     A cycle is every 28 days:     Cyclophosphamide      Dexamethasone      Bortezomib   **Always confirm dose/schedule in your pharmacy ordering system**  REASON: Toxicities / Adverse Event PRIOR TREATMENT: [id number]: CyBord (Cyclophosphamide (Oral) + Bortezomib 1.5 mg/m2 SUBQ D1, 8, 15, 22 + Dexamethasone) q28 Days Until Progression or Unacceptable Toxicity TREATMENT RESPONSE: Unable to Evaluate  START ON PATHWAY REGIMEN - Multiple Myeloma and Other Plasma Cell Dyscrasias     A cycle is every 28 days:     Dexamethasone      Cyclophosphamide      Carfilzomib      Carfilzomib      Carfilzomib   **Always confirm dose/schedule in your pharmacy ordering system**  Patient Characteristics: Relapsed / Refractory, Second through Fourth Lines of Therapy R-ISS Staging: Unknown Disease Classification: Refractory Line of Therapy: Fourth Line Intent of Therapy: Non-Curative / Palliative Intent, Discussed with Patient

## 2019-06-03 ENCOUNTER — Ambulatory Visit: Payer: Medicare Other

## 2019-06-03 ENCOUNTER — Telehealth: Payer: Self-pay | Admitting: Hematology

## 2019-06-03 ENCOUNTER — Other Ambulatory Visit: Payer: Self-pay | Admitting: Hematology

## 2019-06-03 NOTE — Telephone Encounter (Signed)
Called to confirm appt with patient per 11/16 sch message. Pt aware.

## 2019-06-04 ENCOUNTER — Ambulatory Visit: Payer: Medicare Other

## 2019-06-05 IMAGING — DX DG CHEST 1V PORT
1 series · 1 of 1 positions shown · non-contrast
Comparison: PA and lateral 12/30/2017.  Chest and CT chest

CLINICAL DATA: Cough and shortness of breath.

EXAM:
PORTABLE CHEST 1 VIEW

[chest ap]
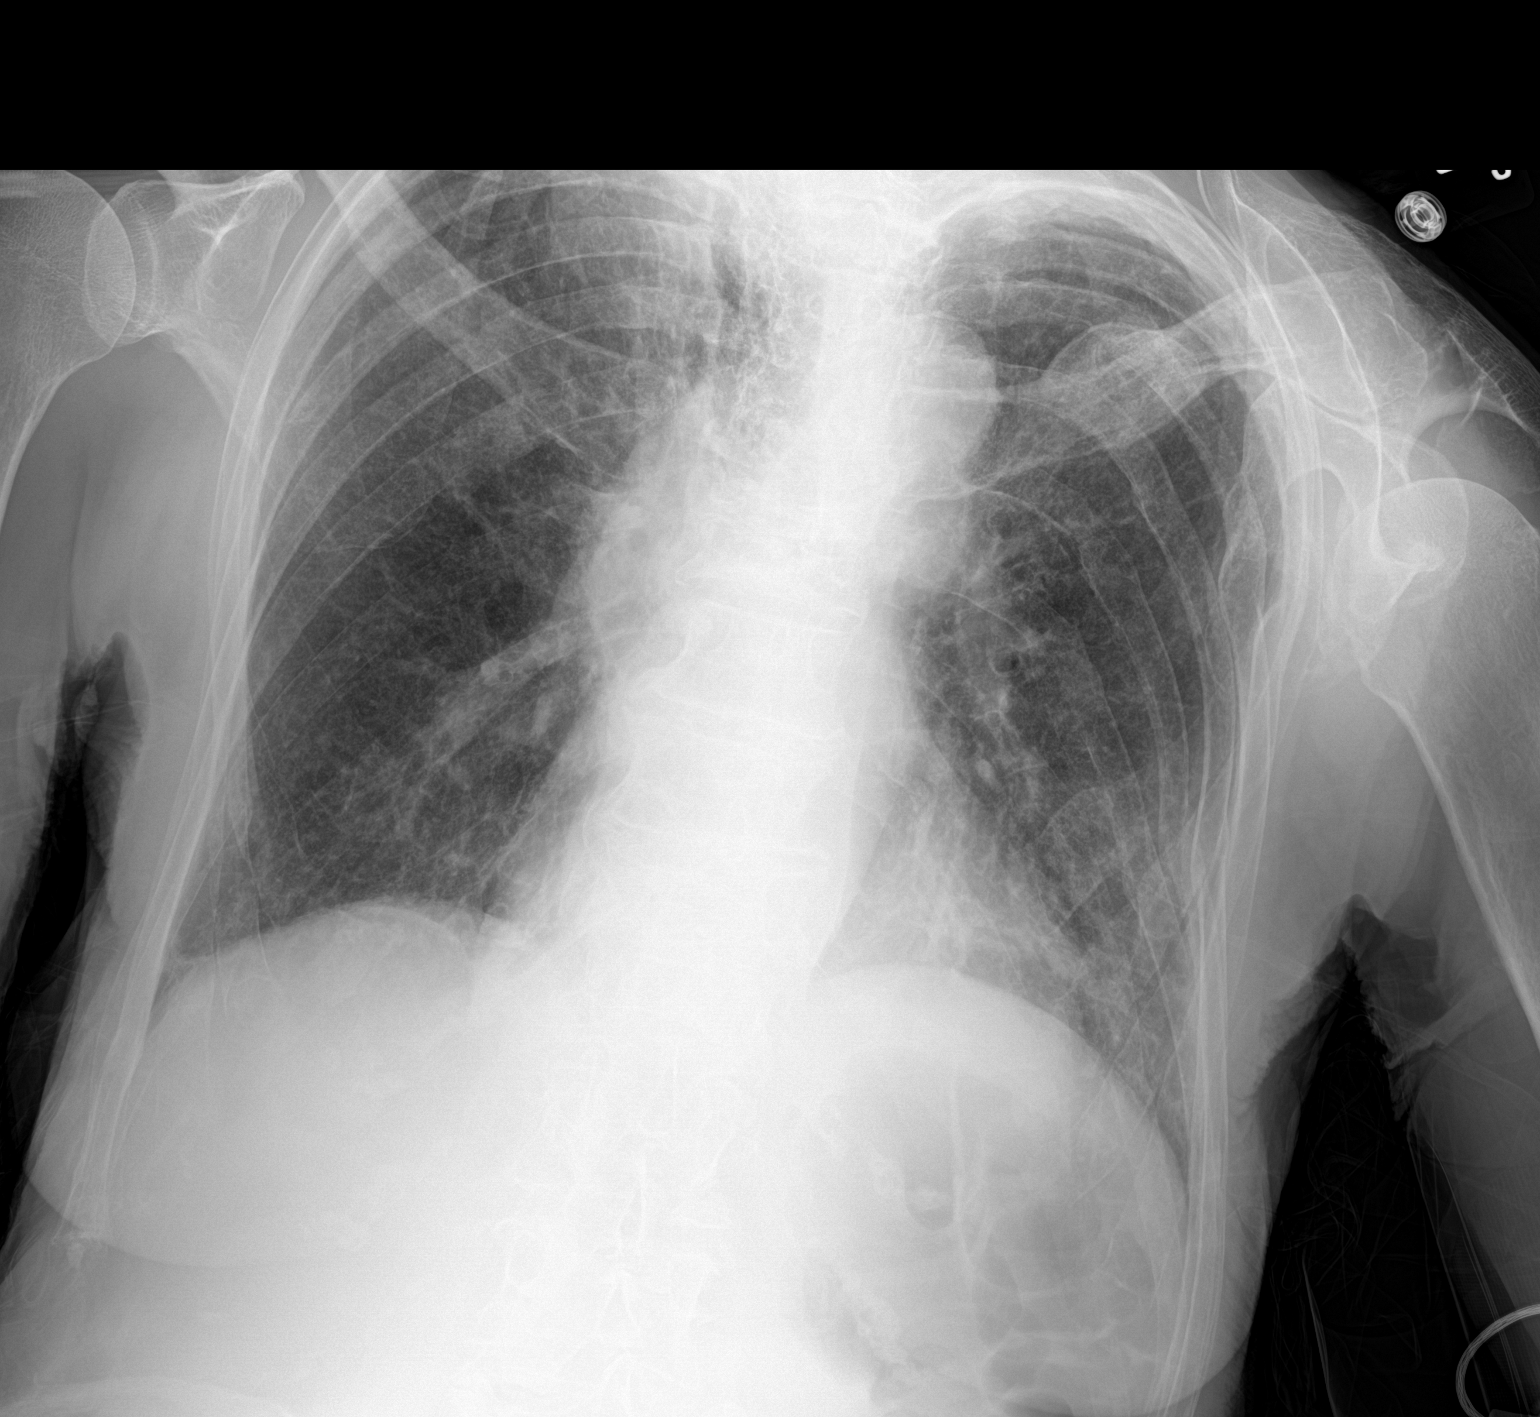

[1 of 1 positions shown; findings below may reference images not displayed]

FINDINGS: Mild left basilar airspace disease is most compatible with
atelectasis. The right lung is clear. No pneumothorax or pleural
fluid. Heart size is normal. Aortic atherosclerosis is identified.
Remote healed left clavicle fracture is seen.
IMPRESSION: Subsegmental atelectasis left lung base.

Atherosclerosis.

## 2019-06-07 ENCOUNTER — Inpatient Hospital Stay: Payer: Medicare Other

## 2019-06-07 ENCOUNTER — Other Ambulatory Visit: Payer: Self-pay

## 2019-06-07 ENCOUNTER — Ambulatory Visit: Payer: Medicare Other

## 2019-06-07 ENCOUNTER — Inpatient Hospital Stay: Payer: Medicare Other | Admitting: Nurse Practitioner

## 2019-06-07 ENCOUNTER — Encounter: Payer: Self-pay | Admitting: Nurse Practitioner

## 2019-06-07 VITALS — BP 109/59 | HR 87 | Temp 98.2°F | Resp 17 | Ht 66.0 in | Wt 117.8 lb

## 2019-06-07 DIAGNOSIS — Z5112 Encounter for antineoplastic immunotherapy: Secondary | ICD-10-CM | POA: Diagnosis not present

## 2019-06-07 DIAGNOSIS — Z79899 Other long term (current) drug therapy: Secondary | ICD-10-CM | POA: Diagnosis not present

## 2019-06-07 DIAGNOSIS — C9000 Multiple myeloma not having achieved remission: Secondary | ICD-10-CM

## 2019-06-07 DIAGNOSIS — Z9481 Bone marrow transplant status: Secondary | ICD-10-CM | POA: Diagnosis not present

## 2019-06-07 DIAGNOSIS — E785 Hyperlipidemia, unspecified: Secondary | ICD-10-CM | POA: Diagnosis not present

## 2019-06-07 DIAGNOSIS — G629 Polyneuropathy, unspecified: Secondary | ICD-10-CM | POA: Diagnosis not present

## 2019-06-07 DIAGNOSIS — D61818 Other pancytopenia: Secondary | ICD-10-CM | POA: Diagnosis not present

## 2019-06-07 DIAGNOSIS — I252 Old myocardial infarction: Secondary | ICD-10-CM | POA: Diagnosis not present

## 2019-06-07 DIAGNOSIS — Z7982 Long term (current) use of aspirin: Secondary | ICD-10-CM | POA: Diagnosis not present

## 2019-06-07 DIAGNOSIS — Z9221 Personal history of antineoplastic chemotherapy: Secondary | ICD-10-CM | POA: Diagnosis not present

## 2019-06-07 DIAGNOSIS — Z923 Personal history of irradiation: Secondary | ICD-10-CM | POA: Diagnosis not present

## 2019-06-07 DIAGNOSIS — C9002 Multiple myeloma in relapse: Secondary | ICD-10-CM

## 2019-06-07 LAB — CBC WITH DIFFERENTIAL (CANCER CENTER ONLY)
Abs Immature Granulocytes: 0.05 10*3/uL (ref 0.00–0.07)
Basophils Absolute: 0 10*3/uL (ref 0.0–0.1)
Basophils Relative: 1 %
Eosinophils Absolute: 0 10*3/uL (ref 0.0–0.5)
Eosinophils Relative: 0 %
HCT: 31.1 % — ABNORMAL LOW (ref 36.0–46.0)
Hemoglobin: 10.1 g/dL — ABNORMAL LOW (ref 12.0–15.0)
Immature Granulocytes: 1 %
Lymphocytes Relative: 3 %
Lymphs Abs: 0.1 10*3/uL — ABNORMAL LOW (ref 0.7–4.0)
MCH: 33.4 pg (ref 26.0–34.0)
MCHC: 32.5 g/dL (ref 30.0–36.0)
MCV: 103 fL — ABNORMAL HIGH (ref 80.0–100.0)
Monocytes Absolute: 0.1 10*3/uL (ref 0.1–1.0)
Monocytes Relative: 1 %
Neutro Abs: 3.6 10*3/uL (ref 1.7–7.7)
Neutrophils Relative %: 94 %
Platelet Count: 153 10*3/uL (ref 150–400)
RBC: 3.02 MIL/uL — ABNORMAL LOW (ref 3.87–5.11)
RDW: 17.2 % — ABNORMAL HIGH (ref 11.5–15.5)
WBC Count: 3.9 10*3/uL — ABNORMAL LOW (ref 4.0–10.5)
nRBC: 0 % (ref 0.0–0.2)

## 2019-06-07 LAB — CMP (CANCER CENTER ONLY)
ALT: 45 U/L — ABNORMAL HIGH (ref 0–44)
AST: 40 U/L (ref 15–41)
Albumin: 3.7 g/dL (ref 3.5–5.0)
Alkaline Phosphatase: 97 U/L (ref 38–126)
Anion gap: 13 (ref 5–15)
BUN: 20 mg/dL (ref 8–23)
CO2: 20 mmol/L — ABNORMAL LOW (ref 22–32)
Calcium: 9.4 mg/dL (ref 8.9–10.3)
Chloride: 104 mmol/L (ref 98–111)
Creatinine: 0.93 mg/dL (ref 0.44–1.00)
GFR, Est AFR Am: >60 mL/min (ref >60)
GFR, Estimated: 59 mL/min — ABNORMAL LOW (ref >60)
Glucose, Bld: 217 mg/dL — ABNORMAL HIGH (ref 70–99)
Potassium: 4 mmol/L (ref 3.5–5.1)
Sodium: 137 mmol/L (ref 135–145)
Total Bilirubin: 1 mg/dL (ref 0.3–1.2)
Total Protein: 7.3 g/dL (ref 6.5–8.1)

## 2019-06-07 MED ORDER — BORTEZOMIB CHEMO SQ INJECTION 3.5 MG (2.5MG/ML)
1.0000 mg/m2 | Freq: Once | INTRAMUSCULAR | Status: AC
  Administered 2019-06-07: 1.5 mg via SUBCUTANEOUS
  Filled 2019-06-07: qty 0.6

## 2019-06-07 NOTE — Patient Instructions (Signed)
[place] Discharge Instructions for Patients Receiving Chemotherapy  Today you received the following chemotherapy agents:  Velcade/Bortezemab  To help prevent nausea and vomiting after your treatment, we encourage you to take your nausea medication as needed.   If you develop nausea and vomiting that is not controlled by your nausea medication, call the clinic.   BELOW ARE SYMPTOMS THAT SHOULD BE REPORTED IMMEDIATELY:  *FEVER GREATER THAN 100.5 F  *CHILLS WITH OR WITHOUT FEVER  NAUSEA AND VOMITING THAT IS NOT CONTROLLED WITH YOUR NAUSEA MEDICATION  *UNUSUAL SHORTNESS OF BREATH  *UNUSUAL BRUISING OR BLEEDING  TENDERNESS IN MOUTH AND THROAT WITH OR WITHOUT PRESENCE OF ULCERS  *URINARY PROBLEMS  *BOWEL PROBLEMS  UNUSUAL RASH Items with * indicate a potential emergency and should be followed up as soon as possible.  Feel free to call the clinic should you have any questions or concerns. The clinic phone number is [phone].  Please show the [place] at check-in to the Emergency Department and triage nurse.  Come back tomorrow for 2 u PRBC's @ 9am

## 2019-06-07 NOTE — Progress Notes (Signed)
[place]   [phone] [phone]   Clinic Follow up Note   Patient Care Team: [name], MD as PCP - General (Family Medicine) [name], MD as PCP - Cardiology (Cardiology) [name], MD as Attending Physician (Obstetrics and Gynecology) [name], MD as Consulting Physician (Internal Medicine) [name], MD as Consulting Physician (Hematology) [name], MD as Consulting Physician (Ophthalmology) [name], MD as Consulting Physician (Cardiology) [name], MD as Consulting Physician (Dermatology) [name] (Dentistry) [name], DPM as Consulting Physician (Podiatry) Pleasant, Eppie Gibson, RN as [place] [facility]  Date of Service:  06/14/2019  CHIEF COMPLAINT: F/u of MM  SUMMARY OF ONCOLOGIC HISTORY: Oncology History Overview Note  Multiple myeloma [facility])   Staging form: Multiple Myeloma, AJCC 6th Edition   - Clinical: Stage IIIA - Signed by [name], MD on 09/04/2013    Multiple myeloma ([place])  05/26/2012 Initial Diagnosis   Presenting IgG was 4,040 mg/dL on 06/05/2012 (IgA 35; IgM 34); kappa free light chain 34.4 mg/dL, lambda 0.00, kappa:lambda ratio of 34.75; SPEP with M-spike of 2.60.   06/30/2012 Imaging   Numerous lytic lesions throughout the calvarium.  Lytic lesion within the left lateral scapula.  Findings most compatible with metastases or myeloma. Multiple mid and lower thoracic compression fractures.  Slight compression through the endplates at L3.   79/15/0569 Bone Marrow Biopsy   Bone marrow biopsy showed 49% plasma cell. Normal classical cytogenetics; however, myeloma FISH panel showed 13q- (intermediate risk)       07/14/2012 - 07/27/2012 Radiation Therapy   Palliative radiation 20 Gy over 10 fractions between to thoracic spine cord compression and symptomatic left scapula lytic lesion.   08/10/2012 - 11/2012 Chemotherapy   Started SQ Velcade once weekly, 3 weeks on, 1 weeks off; daily Revlimid d1-21, 7 days off; and Dexamethasone 21m PO weekly.    02/12/2013 Bone Marrow Transplant   [facility]    06/14/2013 Tumor Marker   IgG  1830,  Kappa:lamba ratio, 1.69 (baseline was 34.75 or   M-spike 0.28 (baseline of 2.6 or  89.3% of baseline).     06/25/2013 -  Chemotherapy   Started zometa 3.5 mg monthly    09/01/2013 - 01/2014 Chemotherapy   Maintenance therapy with revlimid 570mdaily for 14 days and then off for 7 (decreased from 21 days on and 7 days off based on neutropenia). Stopped due to disease progression 01/2017   09/13/2013 - 09/17/2014 Hospital Admission   Hospital admission for pneumonia   12/20/2013 Treatment Plan Change   Maintenance therapy decreased to 2.5 mg daily for 21 days and then off for 7 days based on low counts.    01/25/2014 Tumor Marker   IgG 1360 M spike 0.5   03/01/2014 - 03/05/2014 Hospital Admission   [facility], received thrombolytic therapy, cath showed 60% prox RCA, mid-distal RCA 50%, Echo normal, no stents.   04/11/2014 - 08/07/2014 Chemotherapy   Continue Revlimid at 2.5 mg 3 weeks on 1 week off   08/08/2014 - 08/13/2014 Hospital Admission   Hospital admission for pneumonia. Her Revlimid was held.   08/29/2014 - 09/14/2014 Chemotherapy   Maintenance Revlimid restarted   09/14/2014 - 09/17/2014 Hospital Admission   [place].   11/13/2014 - 12/25/2015 Chemotherapy   Maintenance Revlimid restarted, changed to  2.72m daily, 2 weks on, 1 week off from 12/15/2014, stopped due to disease progression    01/24/2016 Progression   Patient is M protein has gradually increased to 1.0g, repeated a bone marrow biopsy showed plasma cell 10-30%   02/27/2016 - 03/07/2016 Chemotherapy   Pomalidomide 4 mg daily on day 1-21, dexamethasone 40 mg on day 1,  8 and 15, every 28 days, started on 02/27/2016, daratumumab weekly started on 03/07/2016, held after first dose, due to hospitalization and a severe pancytopenia, pomalidomide was stopped afterwards    03/10/2016 - 03/29/2016 Hospital Admission   Pt was admitted for sepsis from pneumonia, and severe pancytopenia. She required ICU stay for a few days due to hypotension, developed b/l pleural effusion required diuretics and b/l thoracentesis. She also required blood and plt transfusion, and prolonged neupogen injection for severe neutropenia. She was discharged home after 3 weeks hospital stay    04/26/2016 - 04/26/2019 Chemotherapy   Daratumumab restarted on 04/26/2016, Velcade 1.315mm2 and dexa 2081meekly start on 11/3, velcade held since 06/28/2016 due to CHF, dexa stopped on12/11/2017 due to high risk of fracture.  Dara reduced to every 4 weeks due to COVID-19 starting 10/23/18. Returned to every 3 weeks starting 12/17/18. Restarted every 2 weeks starting 02/22/19 Stopped on 04/26/19 due to disease progression.    08/26/2016 Echocardiogram   - Left ventricle: LVEF is approximately 35% with diffuse diffuse   hypokinesis with severe hypokinesis/ akinesis of the   inferior/inferoseptal walls. In comparison to echo images from   December 2017, there does not appear to be significant change The   cavity size was normal. Wall thickness was normal. Doppler   parameters are consistent with abnormal left ventricular   relaxation (grade 1 diastolic dysfunction). - Aortic valve: AV is thickened. 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 Present in echo from December 2017 There was   trivial regurgitation. - Right ventricle: Systolic function was mildly reduced. - Right atrium: The atrium was mildly dilated. - Pericardium, extracardiac: A trivial pericardial effusion was   identified.   12/03/2016 Echocardiogram   -Left Ventricle: Systolic  function was   mildly to moderately reduced. The estimated ejection fraction was   in the range of 40% to 45%. Diffuse hypokinesis. Doppler   parameters are consistent with abnormal left ventricular   relaxation (grade 1 diastolic dysfunction). Doppler parameters   are consistent with indeterminate ventricular filling pressure. - Left atrium: The atrium was severely dilated. - Tricuspid valve: There was trivial regurgitation. - Pulmonary arteries: Systolic pressure was within the normal   range. PA peak pressure: 33 mm Hg (S). - Global longitudinal strain -10.5% (abnormal)   03/12/2017 Echocardiogram   ECHO 03/12/17 Study Conclusions - Left ventricle: The cavity size was normal. Wall thickness was   increased in a pattern of mild LVH. Systolic function was normal.   The estimated ejection fraction was in the range of 50% to 55%.   There is hypokinesis of the basalinferior myocardium. Doppler   parameters are consistent with abnormal left ventricular   relaxation (grade 1 diastolic dysfunction). - Aortic valve: There was trivial regurgitation. - Mitral valve: Calcified annulus. Mildly thickened leaflets .   There was trivial regurgitation. - Tricuspid valve: There was mild regurgitation. Impressions: - There has been mild improvement in EF since prior study.    12/12/2017 - 12/15/2017 Hospital Admission   Admission diagnosis: Pnuemonia and acute hypoxic respiratory failure Additional comments: treated  for pneumoania and acute hypoxic respiratory failure before she was discharged on 12/15/17 with oral anitbiotics. She did not require home oxygen.    12/30/2017 - 01/02/2018 Hospital Admission   Admission diagnosis: Pnuemonia  Additional comments: She was again hospitalized on 12/30/17 for pneumonia, ID work up was otherwise negative, she was discharged home with oral antibiotics.    04/01/2018 Imaging   04/01/2018 CXR IMPRESSION: Persisting airspace disease superimposed on chronic  changes, suggesting pneumonia given the history. Followup PA and lateral chest X-ray is recommended in 3-4 weeks following therapy to ensure resolution and exclude underlying malignancy.  Redemonstration of mid and lower thoracic compression fractures with associated thoracic kyphosis.   04/23/2018 Echocardiogram   04/23/2018 ECHO LV EF: 45% -   50%   01/07/2019 Imaging   MRI Brain 01/07/19 IMPRESSION: MRI HEAD IMPRESSION:   1. 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 Lesion closely approximates the left inner ear structures and involves the left Meckel's cave and left fifth cranial nerve, and most likely is the causative etiology for patient's underlying left ear and facial symptoms. Follow-up examination with postcontrast imaging suggested for complete evaluation. Inclusion of IAC sequences would likely be helpful for visualization. 2. Additional 2.7 cm lesion involving the right occipital calvarium with adjacent marrow signal abnormality, also likely related to multiple myeloma. Metastatic disease would be the primary differential consideration. 3. Underlying age-appropriate cerebral atrophy with mild chronic microvascular ischemic disease. No other acute intracranial abnormality.   MRA HEAD IMPRESSION:   1. Negative intracranial MRA with no large vessel occlusion, hemodynamically significant stenosis, or other acute vascular abnormality. 2. Petrous and cavernous left ICA is partially encased and surrounded by the left petrous apex lesion without significant irregularity or stenosis. 3. 2 mm right paraophthalmic aneurysm.   MRA NECK IMPRESSION:   Normal MRA of the neck with wide patency of the carotid and vertebral arteries bilaterally. No hemodynamically significant stenosis or other acute vascular abnormality identified.      01/14/2019 - 01/27/2019 Radiation Therapy   Radaition to Left petrous apex bone lesion 01/14/19-01/27/19 with Dr. Tammi Klippel.    04/01/2019 Pathology Results   Bone Marrow biopsy 04/01/19  DIAGNOSIS: BONE MARROW, ASPIRATE, CLOT, CORE: - Normocellular marrow with minimal involvement by plasma cell neoplasm - See comment PERIPHERAL BLOOD: - Macrocytic anemia and leukopenia -See complete blood count   04/09/2019 PET scan   PET 04/09/19  IMPRESSION: 1. Scattered lytic lesions of bone. A few of these demonstrate hypermetabolic activity, most notably the right occipital bone lesion; the lytic lesion in the approximately C7 spinous process; the lytic expansile lesion of the left fourth rib anteriorly; and a small lytic focus in the right femoral head. Other scattered lytic lesions have only low-grade activity. 2. Multiple fractures as detailed above. 3. Other imaging findings of potential clinical significance: Aortic Atherosclerosis (ICD10-I70.0). Coronary atherosclerosis.   05/10/2019 -  Chemotherapy   Velcade q2weeeks and Dexa 84m weekly was added on 05/10/19. Switched to CyBorD (Oral cyclophosphamide CYTOXAN 5051m IV Velcade 1.43m29m2, and oral dexa 37m343meekly) starting 05/24/19. -changed Velcade to weekly Kyprolis 3 weeks on/1 week off after 2 doses on 06/14/19 due to neuropathy.       CURRENT THERAPY:  -CyBorD (cyclophosphamide (CYTOXAN) 500mg39m0mg/243mVelcade 1.43mg/m260mnd dexa 37mg, w66my) starting 05/24/19. Due to neuropathy after 2 doses of Velcade, I switched to weekly Kyprolis 3 weeks on/1 week off after  2 doses on 06/14/19 -Restart Zometa injections every 6 months on 06/21/19.    INTERVAL HISTORY:  Shannon Rush is here for a follow up and treatment. She presents to the clinic alone. She feels better mostly since blood transfusion. Her energy level from 6 months ago has decreased. She is still on oral Cytoxan and tolerating well. She denies nausea. She notes taste change has  impacted her appetite. She feels she is eating adequately. She has constipation but resolvable. Starting 11/24 she felt she should not put much weight on her left hip. She notes having surgery of her hip in 2009 but does not remember surgeon. She note her occipital tenderness is stable.     REVIEW OF SYSTEMS:   Constitutional: Denies fevers, chills or abnormal weight loss (+) lower energy  Eyes: Denies blurriness of vision Ears, nose, mouth, throat, and face: Denies mucositis or sore throat Respiratory: Denies cough, dyspnea or wheezes Cardiovascular: Denies palpitation, chest discomfort or lower extremity swelling Gastrointestinal:  Denies nausea, heartburn (+) constipation Skin: Denies abnormal skin rashes MSK: (+) Left hip pain  Lymphatics: Denies new lymphadenopathy or easy bruising Neurological:Denies numbness, tingling or new weaknesses Behavioral/Psych: Mood is stable, no new changes  All other systems were reviewed with the patient and are negative.  MEDICAL HISTORY:  Past Medical History:  Diagnosis Date   CAD (coronary artery disease)    a. 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 trivial by echo 06/2017.   OSTEOPOROSIS 06/11/2010   Multiple compression fractures; and spontaneous fracture of sternum Qualifier: Diagnosis of  By: [name], Susan      Thoracic kyphosis 07/13/12   per MRI scan   Unspecified deficiency anemia     SURGICAL HISTORY: Past  Surgical History:  Procedure Laterality Date   APPENDECTOMY     CATARACT EXTRACTION, BILATERAL     CESAREAN SECTION     x2    COLONOSCOPY  2007   neg with Dr. [name]   ELBOW SURGERY     HIP SURGERY  2009   left   TUBAL LIGATION      I have reviewed the social history and family history with the patient and they are unchanged from previous note.  ALLERGIES:  is allergic to zithromax [azithromycin]; zosyn [piperacillin sod-tazobactam so]; and quinolones.  MEDICATIONS:  Current Outpatient Medications  Medication Sig Dispense Refill   acetaminophen (TYLENOL) 500 MG tablet Take 500 mg by mouth every 6 (six) hours as needed for moderate pain or fever.     acyclovir (ZOVIRAX) 400 MG tablet Take 1 tablet (400 mg total) by mouth 2 (two) times daily. 180 tablet 3   aspirin EC 81 MG tablet Take 1 tablet (81 mg total) by mouth daily. 90 tablet 3   atorvastatin (LIPITOR) 20 MG tablet Take 1 tablet (20 mg total) by mouth daily. 10 tablet 0   Calcium Carbonate-Vit D-Min (CALCIUM 600+D PLUS MINERALS) 600-400  MG-UNIT TABS Take by mouth.     cyclophosphamide (CYTOXAN) 50 MG capsule Take 10 capsules (500 mg total) by mouth once a week. Take 300 mg/m2 by mouth with breakfast once weekly. Take with food to minimize GI upset. Take early in the day and maintain hydration. 40 capsule 3   dexamethasone (DECADRON) 4 MG tablet Take 5 tablets (20 mg total) by mouth once a week. Take on the day of chemo 20 tablet 3   doxycycline (VIBRA-TABS) 100 MG tablet Take 100 mg by mouth as needed.     metoprolol succinate (TOPROL-XL) 25 MG 24 hr tablet Take 1 tablet (25 mg total) by mouth daily. 15 tablet 0   ondansetron (ZOFRAN) 8 MG tablet Take 1 tablet (8 mg total) by mouth every 8 (eight) hours as needed for nausea or vomiting. 20 tablet 3   prochlorperazine (COMPAZINE) 10 MG tablet Take 1 tablet (10 mg total) by mouth every 6 (six) hours as needed (Nausea or vomiting). 30 tablet 1   No current  facility-administered medications for this visit.     PHYSICAL EXAMINATION: ECOG PERFORMANCE STATUS: 3 - Symptomatic, >50% confined to bed Blood pressure 106/60, heart rate 88, respirate 18, temperature 36.8, pulse ox 99% on room air GENERAL:alert, no distress and comfortable SKIN: skin color, texture, turgor are normal, no rashes or significant lesions EYES: normal, Conjunctiva are pink and non-injected, sclera clear  NECK: supple, thyroid normal size, non-tender, without nodularity LYMPH:  no palpable lymphadenopathy in the cervical, axillary  LUNGS: clear to auscultation and percussion with normal breathing effort HEART: regular rate & rhythm and no murmurs and no lower extremity edema ABDOMEN:abdomen soft, non-tender and normal bowel sounds Musculoskeletal:no cyanosis of digits and no clubbing  NEURO: alert & oriented x 3 with fluent speech, no focal motor/sensory deficits  LABORATORY DATA:  I have reviewed the data as listed CBC Latest Ref Rng & Units 06/14/2019 06/07/2019 05/31/2019  WBC 4.0 - 10.5 K/uL 2.9(L) 3.9(L) 3.1(L)  Hemoglobin 12.0 - 15.0 g/dL 8.9(L) 10.1(L) 7.7(L)  Hematocrit 36.0 - 46.0 % 26.5(L) 31.1(L) 23.5(L)  Platelets 150 - 400 K/uL 125(L) 153 115(L)     CMP Latest Ref Rng & Units 06/14/2019 06/07/2019 05/31/2019  Glucose 70 - 99 mg/dL 100(H) 217(H) 167(H)  BUN 8 - 23 mg/dL _0 Creatinine 0.44 - 1.00 mg/dL 0.73 0.93 0.81  Sodium 135 - 145 mmol/L 135 137 138  Potassium 3.5 - 5.1 mmol/L 4.2 4.0 3.6  Chloride 98 - 111 mmol/L 103 104 107  CO2 22 - 32 mmol/L 23 20(L) 19(L)  Calcium 8.9 - 10.3 mg/dL 9.1 9.4 8.6(L)  Total Protein 6.5 - 8.1 g/dL 6.6 7.3 6.5  Total Bilirubin 0.3 - 1.2 mg/dL 0.5 1.0 0.6  Alkaline Phos 38 - 126 U/L 78 97 80  AST 15 - 41 U/L 17 40 28  ALT 0 - 44 U/L 32 45(H) 63(H)      RADIOGRAPHIC STUDIES: I have personally reviewed the radiological images as listed and agreed with the findings in the report. Dg Hip Unilat With Pelvis  2-3 Views Left  Result Date: 06/14/2019 CLINICAL DATA:  Left hip pain for 1 week. History of multiple myeloma EXAM: DG HIP (WITH OR WITHOUT PELVIS) 2-3V LEFT COMPARISON:  01/26/2018 x-ray, 04/09/2019 CT FINDINGS: Chronic well-healed left superior and inferior pubic rami fractures. Intact ORIF hardware of the proximal left femur. No periprosthetic lucency or fracture. Left hip joint space is maintained. Sacrum and posterior pelvis are partially obscured  by stool and bowel gas. Known lucent lesions within the iliac wings bilaterally are not well seen radiographically. No pathologic fracture evident. IMPRESSION: 1. Chronic well-healed left superior and inferior pubic rami fractures. Intact left hip ORIF hardware. No acute abnormality. 2. Known lucent lesions within the bilateral iliac wings are not well seen radiographically. Electronically Signed   By: Davina Poke M.D.   On: 06/14/2019 14:24     ASSESSMENT & PLAN:  FAELYN SIGLER is a 78 y.o. female with   1. IgG kappa Multiple myeloma s/p Auto SCT, intermediate risk, relapsed in 01/2016 and progressed again in Oct 2020 -Diagnosed in 05/2012. Treated with induction chemo, radiation and BM transplant, but unfortunately progressed while on maintenance Revlimid 01/2016. She is currently onDaratumumabinfusion monotherapy.  -PETimagesfrom 04/09/19 showedScattered lytic lesions of bone. A few of these demonstrate hypermetabolic activity, most notably the right occipital bone lesion, C7 spinous process, Left 4th rib, andright femoral head.  -Herbone marrow biopsy from 04/02/19 which showsnormocellular marrow with minimal involvement by plasma cell neoplasm(2%). -she has also developed worsening anemia, and mild thrombocytopenia, which is related to her disease progression -Based on Dr. Noah Delaine recommendation I changed her treatment to CyBorD (oral cyclophosphamide 542m, Velcade 1.365mm2, and dexa 2060mweekly) started on 05/24/19. After 2 doses  of Velcade, I switched to weekly Kyprolis 3 weeks on/1 week off starting 06/14/19 due to neuropathy.  -She is clinically doing better with blood transfusion but energy overall lower than baseline. She also has some constipation which I encouraged her to use stool softener for. She developed new left hip pain with weight bearing since last week.  -I will call in prophylactic Prilosec given high dose steroid use and intermittent gastric pain.  -Labs reviewed and adequate to proceed with Kyprolis today.  -Her MM panel shows M-protein has increased from 0.3 to 1.1 latley. Will monitor on new treatment.  -Continue weekly Dexa and Cytoxan  -For her left hip pain I will order Xray to further evaluate and rule out fracture. I encouraged her to ambulate with walker and avoid weight bearing now. She can use Morphine 30 minutes before walking distances for pain. I will call her orthopedic office to get her a f/u appointment within next week  -F/u in 2 weeks with NP Lacie   2. New left hip pain  -she had left hip surgery with rod in 2009  -She developed new left hip pain on weightbearing, no pain at resting  -My nurse called her orthopedic office today and requested an appointment for her   3. Left petrous apex bone lesion with hearing loss  -Her MRI brain from 01/07/19 showedabnormal osseous lesion measuring up to approximately 5.9 cm centered at the left petrous apex. Thismost likely is the cause of her underlyingleft ear and balancesymptoms. -Given she is symptomatic, sheunderwent target RT with Dr ManTammi Klippel1-7/15. This is negative on9/25/20PET scan -Symptoms persist, she has stable muffled hearing and mild imbalanceissue.Stable   4. Right OccipitalskullLesion with tenderness  -She has bulge on prior exam at right occipitalskullwith tenderness -Her PET from 04/09/19 shows lesion with hypermetabolic activity in this area concerning for MM progression here.  -will hold on palliative  radiation for now. Tenderness is stable.   5. History of multiple pneumonia andbronchitis -She has had multiple episodes of pneumonia, required hospitalization,and bronchitis, required antibiotics -She has recovered well, will continue monitoring. I encouraged her to do spirometry,and exercise. -Her vaccines are up-to-date.  6. Pancytopenia -She mainly has anemia and Thrombocytopenia concerning  which occurred with recent (03/2019) disease progression.  -Did give blood transfusion on 06/04/19  -Has decreased some today. No need for blood transfusion.   7. CHF, CAD, inferior STEMI in 02/2014, [facility] -She is on ASA, BB, statin. She will continue to f/u with Dr. Meda Coffee -BPwell controlled and normal lately. -04/2019 echo showed normal EF, will continue monitoring   8. Osteoporosis with multiple fractures -Last DEXA in 2015 showed worsening osteoporosis. She declined repeat DEXA. -She had a fall induced fracture in 01/2018. -She was on Zometa for 4 years, stopped after April 2018 -She has recent left hip pain since 11/24. She does not hip surgery in 2009. Will obtain Xray for further evaluation.  -I discussed restarting Zometa to help strengthen her bone, she is agreeable. Plan to start 06/21/19. Will monitor her dental health   9. Peripheral neuropathy in feet, G2 -Secondary to previous chemo. -She previously declined Gabapentin  -Worse lately since she started Velcade, will change Velcade to Kyprolis which cause less neuropathy on 06/14/19.   10. Goal of care discussion  -she is full code now  22 Steroid induced hyperglycemia -Secondary to steroids, her random blood glucose was 217 last week -I encouraged her to drink plenty of water and reduce sugar and carbohydrate in diet.  -I will check her A1c at next visit. If remains uncontrolled may start her on DM medication.    Plan -Left hip Xray today  -Will obtain records of 2009 surgery, My nurse called orthopedic     -Labs reviewed and adequate to proceed with Kyprolis today and day 8 and 15 every 28 days.  -Continue Dexa and Cytoxan on day 1, 8, 15 every 28 days  -start Prilosec, she will by OTC  -F/u with NP Lacie in 2 weeks   No problem-specific Assessment & Plan notes found for this encounter.   Orders Placed This Encounter  Procedures   DG HIP UNILAT WITH PELVIS 2-3 VIEWS LEFT    Standing Status:   Future    Number of Occurrences:   1    Standing Expiration Date:   08/13/2020    Order Specific Question:   Reason for Exam (SYMPTOM  OR DIAGNOSIS REQUIRED)    Answer:   Left hip pain for a week, not able to bear weight, history of left hip surgery    Order Specific Question:   Preferred imaging location?    Answer:   [facility]    Order Specific Question:   Radiology Contrast Protocol - do NOT remove file path    Answer:   [email or web address]   Hemoglobin A1c    Standing Status:   Future    Standing Expiration Date:   06/13/2020   All questions were answered. The patient knows to call the clinic with any problems, questions or concerns. No barriers to learning was detected. I spent 20 minutes counseling the patient face to face. The total time spent in the appointment was 25 minutes and more than 50% was on counseling and review of test results     [name], MD 06/14/2019   I, Joslyn Devon, am acting as scribe for [name], MD.   I have reviewed the above documentation for accuracy and completeness, and I agree with the above.

## 2019-06-07 NOTE — Progress Notes (Signed)
[place]   [phone] [phone]   Clinic Follow up Note   Patient Care Team: [name], MD as PCP - General (Family Medicine) [name], MD as PCP - Cardiology (Cardiology) [name], MD as Attending Physician (Obstetrics and Gynecology) [name], MD as Consulting Physician (Internal Medicine) [name], MD as Consulting Physician (Hematology) [name], MD as Consulting Physician (Ophthalmology) [name], MD as Consulting Physician (Cardiology) [name], MD as Consulting Physician (Dermatology) [name] (Dentistry) [name], DPM as Consulting Physician (Podiatry) Pleasant, Eppie Gibson, RN as [facility] 06/07/2019  CHIEF COMPLAINT: F/u MM  SUMMARY OF ONCOLOGIC HISTORY: Oncology History Overview Note  Multiple myeloma ([place])   Staging form: Multiple Myeloma, AJCC 6th Edition   - Clinical: Stage IIIA - Signed by [name], MD on 09/04/2013    Multiple myeloma ([place])  05/26/2012 Initial Diagnosis   Presenting IgG was 4,040 mg/dL on 06/05/2012 (IgA 35; IgM 34); kappa free light chain 34.4 mg/dL, lambda 0.00, kappa:lambda ratio of 34.75; SPEP with M-spike of 2.60.   06/30/2012 Imaging   Numerous lytic lesions throughout the calvarium.  Lytic lesion within the left lateral scapula.  Findings most compatible with metastases or myeloma. Multiple mid and lower thoracic compression fractures.  Slight compression through the endplates at L3.   05/11/2535 Bone Marrow Biopsy   Bone marrow biopsy showed 49% plasma cell. Normal classical cytogenetics; however, myeloma FISH panel showed 13q- (intermediate risk)       07/14/2012 - 07/27/2012 Radiation Therapy   Palliative radiation 20 Gy over 10 fractions between to thoracic spine cord compression and symptomatic left scapula lytic lesion.   08/10/2012 - 11/2012 Chemotherapy   Started SQ Velcade once  weekly, 3 weeks on, 1 weeks off; daily Revlimid d1-21, 7 days off; and Dexamethasone 49m PO weekly.    02/12/2013 Bone Marrow Transplant   [facility]    06/14/2013 Tumor Marker   IgG  1830,  Kappa:lamba ratio, 1.69 (baseline was 34.75 or   M-spike 0.28 (baseline of 2.6 or  89.3% of baseline).     06/25/2013 -  Chemotherapy   Started zometa 3.5 mg monthly    09/01/2013 - 01/2014 Chemotherapy   Maintenance therapy with revlimid 541mdaily for 14 days and then off for 7 (decreased from 21 days on and 7 days off based on neutropenia). Stopped due to disease progression 01/2017   09/13/2013 - 09/17/2014 Hospital Admission   Hospital admission for pneumonia   12/20/2013 Treatment Plan Change   Maintenance therapy decreased to 2.5 mg daily for 21 days and then off for 7 days based on low counts.    01/25/2014 Tumor Marker   IgG 1360 M spike 0.5   03/01/2014 - 03/05/2014 Hospital Admission   [facility] with an inferior STEMI, received thrombolytic therapy, cath showed 60% prox RCA, mid-distal RCA 50%, Echo normal, no stents.   04/11/2014 - 08/07/2014 Chemotherapy   Continue Revlimid at 2.5 mg 3 weeks on 1 week off   08/08/2014 - 08/13/2014 Hospital Admission   Hospital admission for pneumonia. Her Revlimid was held.   08/29/2014 - 09/14/2014 Chemotherapy   Maintenance Revlimid restarted   09/14/2014 - 09/17/2014 Hospital Admission   [place].   11/13/2014 - 12/25/2015 Chemotherapy   Maintenance Revlimid restarted, changed to 2.23m52maily, 2 weks on,  1 week off from 12/15/2014, stopped due to disease progression    01/24/2016 Progression   Patient is M protein has gradually increased to 1.0g, repeated a bone marrow biopsy showed plasma cell 10-30%   02/27/2016 - 03/07/2016 Chemotherapy   Pomalidomide 4 mg daily on day 1-21, dexamethasone 40 mg on day 1, 8 and 15, every 28 days,  started on 02/27/2016, daratumumab weekly started on 03/07/2016, held after first dose, due to hospitalization and a severe pancytopenia, pomalidomide was stopped afterwards    03/10/2016 - 03/29/2016 Hospital Admission   Pt was admitted for sepsis from pneumonia, and severe pancytopenia. She required ICU stay for a few days due to hypotension, developed b/l pleural effusion required diuretics and b/l thoracentesis. She also required blood and plt transfusion, and prolonged neupogen injection for severe neutropenia. She was discharged home after 3 weeks hospital stay    04/26/2016 - 04/26/2019 Chemotherapy   Daratumumab restarted on 04/26/2016, Velcade 1.20m/m2 and dexa 2109mweekly start on 11/3, velcade held since 06/28/2016 due to CHF, dexa stopped on12/11/2017 due to high risk of fracture.  Dara reduced to every 4 weeks due to COVID-19 starting 10/23/18. Returned to every 3 weeks starting 12/17/18. Restarted every 2 weeks starting 02/22/19 Stopped on 04/26/19 due to disease progression.    08/26/2016 Echocardiogram   - Left ventricle: LVEF is approximately 35% with diffuse diffuse   hypokinesis with severe hypokinesis/ akinesis of the   inferior/inferoseptal walls. In comparison to echo images from   December 2017, there does not appear to be significant change The   cavity size was normal. Wall thickness was normal. Doppler   parameters are consistent with abnormal left ventricular   relaxation (grade 1 diastolic dysfunction). - Aortic valve: AV is thickened. 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 Present in echo from December 2017 There was   trivial regurgitation. - Right ventricle: Systolic function was mildly reduced. - Right atrium: The atrium was mildly dilated. - Pericardium, extracardiac: A trivial pericardial effusion was   identified.   12/03/2016 Echocardiogram   -Left Ventricle: Systolic function was   mildly to  moderately reduced. The estimated ejection fraction was   in the range of 40% to 45%. Diffuse hypokinesis. Doppler   parameters are consistent with abnormal left ventricular   relaxation (grade 1 diastolic dysfunction). Doppler parameters   are consistent with indeterminate ventricular filling pressure. - Left atrium: The atrium was severely dilated. - Tricuspid valve: There was trivial regurgitation. - Pulmonary arteries: Systolic pressure was within the normal   range. PA peak pressure: 33 mm Hg (S). - Global longitudinal strain -10.5% (abnormal)   03/12/2017 Echocardiogram   ECHO 03/12/17 Study Conclusions - Left ventricle: The cavity size was normal. Wall thickness was   increased in a pattern of mild LVH. Systolic function was normal.   The estimated ejection fraction was in the range of 50% to 55%.   There is hypokinesis of the basalinferior myocardium. Doppler   parameters are consistent with abnormal left ventricular   relaxation (grade 1 diastolic dysfunction). - Aortic valve: There was trivial regurgitation. - Mitral valve: Calcified annulus. Mildly thickened leaflets .   There was trivial regurgitation. - Tricuspid valve: There was mild regurgitation. Impressions: - There has been mild improvement in EF since prior study.    12/12/2017 - 12/15/2017 Hospital Admission   Admission diagnosis: Pnuemonia and acute hypoxic respiratory failure Additional comments: treated for pneumoania and acute hypoxic  respiratory failure before she was discharged on 12/15/17 with oral anitbiotics. She did not require home oxygen.    12/30/2017 - 01/02/2018 Hospital Admission   Admission diagnosis: Pnuemonia  Additional comments: She was again hospitalized on 12/30/17 for pneumonia, ID work up was otherwise negative, she was discharged home with oral antibiotics.    04/01/2018 Imaging   04/01/2018 CXR IMPRESSION: Persisting airspace disease superimposed on chronic changes, suggesting pneumonia  given the history. Followup PA and lateral chest X-ray is recommended in 3-4 weeks following therapy to ensure resolution and exclude underlying malignancy.  Redemonstration of mid and lower thoracic compression fractures with associated thoracic kyphosis.   04/23/2018 Echocardiogram   04/23/2018 ECHO LV EF: 45% -   50%   01/07/2019 Imaging   MRI Brain 01/07/19 IMPRESSION: MRI HEAD IMPRESSION:   1. 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 Lesion closely approximates the left inner ear structures and involves the left Meckel's cave and left fifth cranial nerve, and most likely is the causative etiology for patient's underlying left ear and facial symptoms. Follow-up examination with postcontrast imaging suggested for complete evaluation. Inclusion of IAC sequences would likely be helpful for visualization. 2. Additional 2.7 cm lesion involving the right occipital calvarium with adjacent marrow signal abnormality, also likely related to multiple myeloma. Metastatic disease would be the primary differential consideration. 3. Underlying age-appropriate cerebral atrophy with mild chronic microvascular ischemic disease. No other acute intracranial abnormality.   MRA HEAD IMPRESSION:   1. Negative intracranial MRA with no large vessel occlusion, hemodynamically significant stenosis, or other acute vascular abnormality. 2. Petrous and cavernous left ICA is partially encased and surrounded by the left petrous apex lesion without significant irregularity or stenosis. 3. 2 mm right paraophthalmic aneurysm.   MRA NECK IMPRESSION:   Normal MRA of the neck with wide patency of the carotid and vertebral arteries bilaterally. No hemodynamically significant stenosis or other acute vascular abnormality identified.    01/14/2019 - 01/27/2019 Radiation Therapy    Radaition to Left petrous apex bone lesion 01/14/19-01/27/19 with Dr. Tammi Klippel.    04/01/2019 Pathology Results   Bone Marrow biopsy 04/01/19  DIAGNOSIS: BONE MARROW, ASPIRATE, CLOT, CORE: - Normocellular marrow with minimal involvement by plasma cell neoplasm - See comment PERIPHERAL BLOOD: - Macrocytic anemia and leukopenia -See complete blood count   04/09/2019 PET scan   PET 04/09/19  IMPRESSION: 1. Scattered lytic lesions of bone. A few of these demonstrate hypermetabolic activity, most notably the right occipital bone lesion; the lytic lesion in the approximately C7 spinous process; the lytic expansile lesion of the left fourth rib anteriorly; and a small lytic focus in the right femoral head. Other scattered lytic lesions have only low-grade activity. 2. Multiple fractures as detailed above. 3. Other imaging findings of potential clinical significance: Aortic Atherosclerosis (ICD10-I70.0). Coronary atherosclerosis.   05/10/2019 -  Chemotherapy   Velcade q2weeeks and Dexa 49m weekly was added on 05/10/19. Switched to CyBorD (Oral cyclophosphamide CYTOXAN 5056m IV Velcade 1.8m67m2, and oral dexa 85m52meekly) starting 05/24/19. -changed Velcade to weekly Kyprolis 3 weeks on/1 week off after 2 doses on 06/14/19 due to neuropathy.      CURRENT THERAPY: -CyBorD (cyclophosphamide 500mg79m0mg/47mVelcade 1.8mg/m215mnd dexa 85mg, w10my) starting 05/24/19. Plan to d/c velcade and change to kyprolis on 11/30 due to worsening neuropathy.   INTERVAL HISTORY: Shannon Rush [facility] for f/u and treatment as scheduled. She  completed day 8 velcade on 05/31/19 and continues dex and cytoxan weekly. Her neuropathy is no worse. She still functions relatively well. She has worsening low back pain over last 5-6 days. She takes tylenol which helps most of the time. She has taken morphine twice and manages constipation. She remains able to bike, be active in the garden, and do her normal activities. She has  some difficulty sleeping. She continues to have fatigue, but slightly better after blood transfusion. She has dyspnea on strenuous exertion, improving. Has intermittent pain in her left ribs. No cough. Denies fever, chills.   MEDICAL HISTORY:  Past Medical History:  Diagnosis Date   CAD (coronary artery disease)    a. 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 trivial by echo 06/2017.   OSTEOPOROSIS 06/11/2010   Multiple compression fractures; and spontaneous fracture of sternum Qualifier: Diagnosis of  By: [name], Susan      Thoracic kyphosis 07/13/12   per MRI scan   Unspecified deficiency anemia     SURGICAL HISTORY: Past Surgical History:  Procedure Laterality Date   APPENDECTOMY     CATARACT EXTRACTION, BILATERAL     CESAREAN SECTION     x2    COLONOSCOPY  2007   neg with Dr. [name]   ELBOW SURGERY     HIP SURGERY  2009   left   TUBAL LIGATION      I have reviewed the social history and family history with the patient and they are unchanged from previous note.  ALLERGIES:  is allergic to zithromax [azithromycin]; zosyn [piperacillin sod-tazobactam so]; and quinolones.  MEDICATIONS:  Current Outpatient Medications  Medication Sig Dispense Refill   acetaminophen (TYLENOL) 500 MG tablet Take 500 mg by mouth every 6 (six) hours as needed for moderate pain or fever.     acyclovir (ZOVIRAX) 400 MG tablet Take 1 tablet (400 mg total) by mouth 2 (two) times daily. 180 tablet 3     aspirin EC 81 MG tablet Take 1 tablet (81 mg total) by mouth daily. 90 tablet 3   atorvastatin (LIPITOR) 20 MG tablet Take 1 tablet (20 mg total) by mouth daily. 10 tablet 0   Calcium Carbonate-Vit D-Min (CALCIUM 600+D PLUS MINERALS) 600-400 MG-UNIT TABS Take by mouth.     dexamethasone (DECADRON) 4 MG tablet Take 5 tablets (20 mg total) by mouth once a week. Take on the day of chemo 20 tablet 3   doxycycline (VIBRA-TABS) 100 MG tablet Take 100 mg by mouth as needed.     metoprolol succinate (TOPROL-XL) 25 MG 24 hr tablet Take 1 tablet (25 mg total) by mouth daily. 15 tablet 0   ondansetron (ZOFRAN) 8 MG tablet Take 1 tablet (8 mg total) by mouth every 8 (eight) hours as needed for nausea or vomiting. 20 tablet 3   No current facility-administered medications for this visit.    Facility-Administered Medications Ordered in Other Visits  Medication Dose Route Frequency Provider  Last Rate Last Dose   bortezomib SQ (VELCADE) chemo injection 1.5 mg  1 mg/m2 (Treatment Plan Recorded) Subcutaneous Once [name], MD        PHYSICAL EXAMINATION: ECOG PERFORMANCE STATUS: 1 - Symptomatic but completely ambulatory  Vitals:   06/07/19 1506  BP: (!) 109/59  Pulse: 87  Resp: 17  Temp: 98.2 F (36.8 C)  SpO2: 98%   Filed Weights   06/07/19 1506  Weight: 117 lb 12.8 oz (53.4 kg)    GENERAL:alert, no distress and comfortable SKIN: no rash  EYES:  sclera clear LUNGS: clear with normal breathing effort HEART: regular rate & rhythm, no lower extremity edema ABDOMEN: abdomen soft, non-tender and normal bowel sounds Musculoskeletal: focal tenderness to lower back  NEURO: alert & oriented x 3 with fluent speech. Decreased vibratory sense in toes bilaterally per tuning fork exam  LABORATORY DATA:  I have reviewed the data as listed CBC Latest Ref Rng & Units 06/07/2019 05/31/2019 05/24/2019  WBC 4.0 - 10.5 K/uL 3.9(L) 3.1(L) 4.1  Hemoglobin 12.0 - 15.0 g/dL 10.1(L) 7.7(L) 8.2(L)   Hematocrit 36.0 - 46.0 % 31.1(L) 23.5(L) 24.8(L)  Platelets 150 - 400 K/uL 153 115(L) 130(L)     CMP Latest Ref Rng & Units 06/07/2019 05/31/2019 05/24/2019  Glucose 70 - 99 mg/dL 217(H) 167(H) 130(H)  BUN 8 - 23 mg/dL 20 14 21   Creatinine 0.44 - 1.00 mg/dL 0.93 0.81 0.89  Sodium 135 - 145 mmol/L 137 138 142  Potassium 3.5 - 5.1 mmol/L 4.0 3.6 3.6  Chloride 98 - 111 mmol/L 104 107 110  CO2 22 - 32 mmol/L 20(L) 19(L) 19(L)  Calcium 8.9 - 10.3 mg/dL 9.4 8.6(L) 9.0  Total Protein 6.5 - 8.1 g/dL 7.3 6.5 6.5  Total Bilirubin 0.3 - 1.2 mg/dL 1.0 0.6 0.7  Alkaline Phos 38 - 126 U/L 97 80 70  AST 15 - 41 U/L 40 28 62(H)  ALT 0 - 44 U/L 45(H) 63(H) 58(H)      RADIOGRAPHIC STUDIES: I have personally reviewed the radiological images as listed and agreed with the findings in the report. No results found.   ASSESSMENT & PLAN: Shannon Rush a 78 y.o.femalewith    1. IgG kappa Multiple myeloma s/p Auto SCT, intermediate risk, relapsed in 01/2016 -Diagnosed in 05/2012. Treated with induction chemo, radiation and BM transplant, but unfortunately progressed while on maintenance Revlimid 01/2016. She is currently onDaratumumabmonotherapy -Dexa was stoppeddue to her high risk of fracture, and well controlled MM -During COVID19 treatment changed toevery 4 weeks, and changed back to every 3 weeks in early June -Due toher slight disease progressionandsymptomatic bone lesion in left petrous apex, Dr. Burr Medico changed Dara back to q2 weeks. -Due to recent increase in M protein from 0.3 to 0.6, she underwent restaging bone marrow biopsy in 03/2019 which shows normocellular marrow and minimal involvement by plasma cell neoplasm (2%) -She also underwent PET scan in 03/2019 which shows scattered lytic lesions, some of which show hypermetabolic activity. She is symptomatic of the occipital lesion. She met with Dr. Tammi Klippel who offered palliative RT to occipital lesion but she ultimately decided to  hold  -per the recommendation of Dr. Samule Ohm at [facility] and Dr. Burr Medico, she started CyBorD on 05/24/19. She developed worsening neuropathy after first velcade inj -she has worsening of her low back pain today, exam shows focal tenderness. This is likely related to left iliac vs L5 lesions noted on PET scan. She did not want to proceed with plain films  today to r/o fracture. Pt prefers to manage with pain meds for now. She understands we can refer her back to Dr. Tammi Klippel for consideration of local treatment in the future  -Labs today are adequate to proceed with treatment. She took dex 20 mg and cytoxan 100 mg at home today.  -due to lack of insurance approval, she will get velcade today. Neuropathy is stable. Plan to change to kyprolis next week (3 weeks on/1 week off) and continue cytoxan/dex -I reviewed the plan with Dr. Burr Medico and pharmacy -f/u next week before treatment   2. Left petrous apex bone lesion -Her inner ear issues and dizziness progressed and she developedmildbalance issueand nausea  -Her MRI brain from 01/07/19 showedabnormal osseous lesion measuring up to approximately 5.9 cm centered at the left petrous apex. Thismost likely is the cause of her underlyingleft ear and balancesymptoms. -Given she is symptomatic, sheunderwent target RT with Dr Tammi Klippel 7/1-7/15. -She has stable muffled hearing and mild imbalanceissue, no significant improvement after RT  3. History of multiple pneumonia andbronchitis -She has had multiple episodes of pneumonia and bronchitis, required hospitalization and antibiotics -Her vaccines are up-to-date.  4. Pancytopenia -Secondary to Dara and recently related to disease progression -s/p RBC for symptomatic anemia 7.7 on 11/20 --Hgb improved to 10.1 today, WBC stable, PLT normal today  5. CHF, CAD, inferior STEMI in 02/2014, H[facility] -She is on ASA, BB, statin -f/u with Dr. Meda Coffee and continue medications -normal EF and improved strain on echo  from 04/27/19, per cardiology PA ok to do echo q6 months for now   6. Osteoporosis with multiple fractures -Last DEXA in 2015 showed worsening osteoporosis. She declined repeat DEXA. -She had a fall induced fracture in 01/2018. -She was on Zometa for 4 years, stopped after April 2018  7. Peripheral neuropathy in feet, G1-2 -Secondary to previous chemo. -worse in feet than hands -tuning fork exam shows decreased vibratory sense up to her ankle.  -previously declined gabapentin -Dr. Burr Medico recommended to change regimen from velcade to kyprolis after 2 doses due to worsening neuropathy.  -she reports stable CIPN, exam is unchanged.  -due to lack of insurance approval for kyprolis today and stable exam, will get 1 more dose of velcade today, plan to change to kyprolis on 11/30   8. Left posterior calf wound -she sustained a wound in early August, is has not healed quickly. She applied topical antibiotic ointment for a few weeks - will treat with Doxycycline 100 mg BID x1 week.  -resolved  9. Goal of care discussion  -full code -treatment goal is palliative   PLAN: -Labs reviewed -Proceed with velcade today -Pending change to kyprolis next week (3 weeks on/1 week off), awaiting insurance approval  -Continue weekly dex/cytoxan  -F/u next week  -Continue symptom management for pain   All questions were answered. The patient knows to call the clinic with any problems, questions or concerns. No barriers to learning was detected. I spent 20 minutes counseling the patient face to face. The total time spent in the appointment was 25 minutes and more than 50% was on counseling and review of test results     Alla Feeling, NP 06/07/19

## 2019-06-07 NOTE — Progress Notes (Signed)
PA for carfilzomib not completed by infusion time today, so patient will receive one more dose of bortezomib today per Dr. Burr Medico. Patient took ondansetron and dexamethasone at home, so no further premedications required. Orders updated - a single bortezomib treatment day has been added to the patient's treatment new plan.   Demetrius Charity, PharmD, [facility] Pharmacist Pharmacy Phone: [phone] 06/07/2019

## 2019-06-08 ENCOUNTER — Other Ambulatory Visit: Payer: Self-pay | Admitting: Hematology

## 2019-06-08 ENCOUNTER — Ambulatory Visit (INDEPENDENT_AMBULATORY_CARE_PROVIDER_SITE_OTHER): Payer: Medicare Other

## 2019-06-08 ENCOUNTER — Ambulatory Visit: Payer: Medicare Other

## 2019-06-08 ENCOUNTER — Telehealth: Payer: Self-pay | Admitting: Nurse Practitioner

## 2019-06-08 VITALS — BP 106/65 | Ht 67.0 in | Wt 117.0 lb

## 2019-06-08 DIAGNOSIS — Z0000 Encounter for general adult medical examination without abnormal findings: Secondary | ICD-10-CM

## 2019-06-08 DIAGNOSIS — C9002 Multiple myeloma in relapse: Secondary | ICD-10-CM

## 2019-06-08 LAB — IFE, DARA-SPECIFIC, SERUM
IgA: <5 mg/dL — ABNORMAL LOW (ref 64–422)
IgG (Immunoglobin G), Serum: 1596 mg/dL (ref 586–1602)
IgM (Immunoglobulin M), Srm: 9 mg/dL — ABNORMAL LOW (ref 26–217)

## 2019-06-08 LAB — PROTEIN ELECTROPHORESIS, SERUM
A/G Ratio: 1.1 (ref 0.7–1.7)
Albumin ELP: 3.6 g/dL (ref 2.9–4.4)
Alpha-1-Globulin: 0.3 g/dL (ref 0.0–0.4)
Alpha-2-Globulin: 0.7 g/dL (ref 0.4–1.0)
Beta Globulin: 1 g/dL (ref 0.7–1.3)
Gamma Globulin: 1.3 g/dL (ref 0.4–1.8)
Globulin, Total: 3.2 g/dL (ref 2.2–3.9)
M-Spike, %: 1.1 g/dL — ABNORMAL HIGH
Total Protein ELP: 6.8 g/dL (ref 6.0–8.5)

## 2019-06-08 LAB — KAPPA/LAMBDA LIGHT CHAINS
Kappa free light chain: 40.3 mg/L — ABNORMAL HIGH (ref 3.3–19.4)
Kappa, lambda light chain ratio: 25.19 — ABNORMAL HIGH (ref 0.26–1.65)
Lambda free light chains: 1.6 mg/L — ABNORMAL LOW (ref 5.7–26.3)

## 2019-06-08 MED ORDER — CYCLOPHOSPHAMIDE 50 MG PO CAPS: 300.0000 mg/m2 | ORAL_CAPSULE | ORAL | 3 refills | Status: DC

## 2019-06-08 NOTE — Patient Instructions (Signed)
You spoke to Shannon Rush, [place] over the phone for your annual wellness visit.  We discussed goals: Goals    . Exercise 150 minutes per week (moderate activity)     Continue current regimen      We also discussed recommended health maintenance. As discussed, you are up to date with everything!    Health Maintenance  Topic Date Due  . COLONOSCOPY  07/21/2020  . MAMMOGRAM  07/24/2020  . TETANUS/TDAP  11/21/2023  . INFLUENZA VACCINE  Completed  . DEXA SCAN  Completed  . PNA vac Low Risk Adult  Completed   Continue to stay positive throughout the Pandemic.  Enjoy walking Brayden and your bike rides!  Preventive Care 57 Years and Older, Female Preventive care refers to lifestyle choices and visits with your health care provider that can promote health and wellness. This includes:  A yearly physical exam. This is also called an annual well check.  Regular dental and eye exams.  Immunizations.  Screening for certain conditions.  Healthy lifestyle choices, such as diet and exercise. What can I expect for my preventive care visit? Physical exam Your health care provider will check:  Height and weight. These may be used to calculate body mass index (BMI), which is a measurement that tells if you are at a healthy weight.  Heart rate and blood pressure.  Your skin for abnormal spots. Counseling Your health care provider may ask you questions about:  Alcohol, tobacco, and drug use.  Emotional well-being.  Home and relationship well-being.  Sexual activity.  Eating habits.  History of falls.  Memory and ability to understand (cognition).  Work and work Statistician.  Pregnancy and menstrual history. What immunizations do I need?  Influenza (flu) vaccine  This is recommended every year. Tetanus, diphtheria, and pertussis (Tdap) vaccine  You may need a Td booster every 10 years. Varicella (chickenpox) vaccine  You may need this vaccine if you have not already  been vaccinated. Zoster (shingles) vaccine  You may need this after age 75. Pneumococcal conjugate (PCV13) vaccine  One dose is recommended after age 38. Pneumococcal polysaccharide (PPSV23) vaccine  One dose is recommended after age 64. Measles, mumps, and rubella (MMR) vaccine  You may need at least one dose of MMR if you were born in 1957 or later. You may also need a second dose. Meningococcal conjugate (MenACWY) vaccine  You may need this if you have certain conditions. Hepatitis A vaccine  You may need this if you have certain conditions or if you travel or work in places where you may be exposed to hepatitis A. Hepatitis B vaccine  You may need this if you have certain conditions or if you travel or work in places where you may be exposed to hepatitis B. Haemophilus influenzae type b (Hib) vaccine  You may need this if you have certain conditions. You may receive vaccines as individual doses or as more than one vaccine together in one shot (combination vaccines). Talk with your health care provider about the risks and benefits of combination vaccines. What tests do I need? Blood tests  Lipid and cholesterol levels. These may be checked every 5 years, or more frequently depending on your overall health.  Hepatitis C test.  Hepatitis B test. Screening  Lung cancer screening. You may have this screening every year starting at age 68 if you have a 30-pack-year history of smoking and currently smoke or have quit within the past 15 years.  Colorectal cancer screening.  All adults should have this screening starting at age 50 and continuing until age 66. Your health care provider may recommend screening at age 31 if you are at increased risk. You will have tests every 1-10 years, depending on your results and the type of screening test.  Diabetes screening. This is done by checking your blood sugar (glucose) after you have not eaten for a while (fasting). You may have this done  every 1-3 years.  Mammogram. This may be done every 1-2 years. Talk with your health care provider about how often you should have regular mammograms.  BRCA-related cancer screening. This may be done if you have a family history of breast, ovarian, tubal, or peritoneal cancers. Other tests  Sexually transmitted disease (STD) testing.  Bone density scan. This is done to screen for osteoporosis. You may have this done starting at age 22. Follow these instructions at home: Eating and drinking  Eat a diet that includes fresh fruits and vegetables, whole grains, lean protein, and low-fat dairy products. Limit your intake of foods with high amounts of sugar, saturated fats, and salt.  Take vitamin and mineral supplements as recommended by your health care provider.  Do not drink alcohol if your health care provider tells you not to drink.  If you drink alcohol: ? Limit how much you have to 0-1 drink a day. ? Be aware of how much alcohol is in your drink. In the [place] one drink equals one 12 oz bottle of beer (355 mL), one 5 oz glass of wine (148 mL), or one 1 oz glass of hard liquor (44 mL). Lifestyle  Take daily care of your teeth and gums.  Stay active. Exercise for at least 30 minutes on 5 or more days each week.  Do not use any products that contain nicotine or tobacco, such as cigarettes, e-cigarettes, and chewing tobacco. If you need help quitting, ask your health care provider.  If you are sexually active, practice safe sex. Use a condom or other form of protection in order to prevent STIs (sexually transmitted infections).  Talk with your health care provider about taking a low-dose aspirin or statin. What's next?  Go to your health care provider once a year for a well check visit.  Ask your health care provider how often you should have your eyes and teeth checked.  Stay up to date on all vaccines. This information is not intended to replace advice given to you by your  health care provider. Make sure you discuss any questions you have with your health care provider. Document Released: 07/28/2015 Document Revised: 06/25/2018 Document Reviewed: 06/25/2018 Elsevier Patient Education  2020 [facility] number is [phone]. Please call with questions or concerns about what we discussed today.

## 2019-06-08 NOTE — Progress Notes (Addendum)
 Subjective:   Shannon Rush is a 78 y.o. female who presents for Medicare Annual (Subsequent) preventive examination.  The patient consented to a virtual visit.  Review of Systems: Defer to PCP.  Cardiac Risk Factors include: advanced age (>55men, >65 women)  Objective:   Vitals: BP 106/65 Comment: reported yesterdays reading @[facility]  Ht 5' 7" (1.702 m)   Wt 117 lb (53.1 kg)   BMI 18.32 kg/m   Body mass index is 18.32 kg/m.  Advanced Directives 05/18/2019 01/12/2019 01/11/2019 11/24/2018 09/23/2018 08/18/2018 08/18/2018  Does Patient Have a Medical Advance Directive? Yes Yes Yes No Yes Yes No  Type of Advance Directive Healthcare Power of Attorney;Living will Healthcare Power of Attorney;Living will Healthcare Power of Attorney;Living will - Healthcare Power of Attorney;Living will Healthcare Power of Attorney;Living will -  Does patient want to make changes to medical advance directive? No - Patient declined - - - - - -  Copy of Healthcare Power of Attorney in Chart? No - copy requested Yes - validated most recent copy scanned in chart (See row information) No - copy requested - Yes - validated most recent copy scanned in chart (See row information) Yes - validated most recent copy scanned in chart (See row information) -  Would patient like information on creating a medical advance directive? No - Patient declined - No - Patient declined No - Patient declined No - Patient declined - No - Patient declined   Tobacco Social History   Tobacco Use  Smoking Status Never Smoker  Smokeless Tobacco Never Used     Clinical Intake:  Pre-visit preparation completed: Yes  How often do you need to have someone help you when you read instructions, pamphlets, or other written materials from your doctor or pharmacy?: 1 - Never What is the last grade level you completed in school?: PhD in Psychology  Interpreter Needed?: No  Past Medical History:  Diagnosis Date  . CAD (coronary artery disease)     a. inf STEMI (in [place] >>> [facility]) >>> LHC (8/15- [facility]):  pRCA 60%, mid to dist RCA 50% >>> med rx  . Cardiomyopathy (HCC)   . Cataract   . Chronic combined systolic and diastolic CHF (congestive heart failure) (HCC)    cancer medication related   . Cord compression (HCC) 07/13/12   MRI- diffuse myeloma involvement of T-L spine  . History of radiation therapy 07/13/12-07/27/12   spinal cord compression T3-T10,left scapula  . 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 Multiple myeloma (HCC) 07/01/2012  . MVP (mitral valve prolapse)    a. trivial by echo 06/2017.  . OSTEOPOROSIS 06/11/2010   Multiple compression fractures; and spontaneous fracture of sternum Qualifier: Diagnosis of  By: Saxon NP, Susan     . Thoracic kyphosis 07/13/12   per MRI scan  . Unspecified deficiency anemia    Past Surgical History:  Procedure Laterality Date  . APPENDECTOMY    . CATARACT EXTRACTION, BILATERAL    . CESAREAN SECTION     x2   . COLONOSCOPY  2007   neg with Dr. Magod  . ELBOW SURGERY    . HIP SURGERY  2009   left  . TUBAL LIGATION     Family History  Problem Relation Age of Onset  . Glaucoma Mother   . Heart disease Mother   . Peripheral vascular disease Mother   . Raynaud syndrome Mother   . Heart disease Father   . Heart failure   Father   . Glaucoma Father   . Cancer Brother        stage IV prostate CA  . Prostate cancer Brother   . Raynaud syndrome Daughter   . Raynaud syndrome Daughter   . Breast cancer Neg Hx   . Colon cancer Neg Hx    Social History   Socioeconomic History  . Marital status: Married    Spouse name: Fritz Pickerel  . Number of children: 2  . Years of education: Not on file  . Highest education level: Doctorate  Occupational History  . Occupation: Museum/gallery conservator: [employer]  . Financial resource strain: Not hard at all  . Food insecurity    Worry: Never true    Inability: Never true  .  Transportation needs    Medical: No    Non-medical: No  Tobacco Use  . Smoking status: Never Smoker  . Smokeless tobacco: Never Used  Substance and Sexual Activity  . Alcohol use: Yes    Alcohol/week: 7.0 standard drinks    Types: 7 Standard drinks or equivalent per week    Comment: 1 glass of wine or beer 3-5 days per week  . Drug use: No  . Sexual activity: Yes    Birth control/protection: Post-menopausal  Lifestyle  . Physical activity    Days per week: 5 days    Minutes per session: 60 min  . Stress: Not at all  Relationships  . Social connections    Talks on phone: More than three times a week    Gets together: More than three times a week    Attends religious service: Never    Active member of club or organization: Yes    Attends meetings of clubs or organizations: More than 4 times per year    Relationship status: Married  Other Topics Concern  . Not on file  Social History Narrative   Who lives with you: spouse, 2 story home, does not have problems with the stairs, has walk in shower, handrails.   Any pets: Dog, Brayden, black lab/boxer mix   Diet: Patient has a varied diet of protein, vegetables, starches.  Limits red meats.   Exercise: Patient exercises at least 3 times a week for over an hour.   Seatbelts: Patient reports wearing her seatbelt when in vehicle.    Nancy Fetter Exposure/Protection: Patient reports wearing sunscreen daily.   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 Biked 5 miles this morning to this appointment.    Outpatient Encounter Medications as of 06/08/2019  Medication Sig  . acetaminophen (TYLENOL) 500 MG tablet Take 500 mg by mouth every 6 (six) hours as needed for moderate pain or fever.  . acyclovir (ZOVIRAX) 400 MG tablet Take 1 tablet (400 mg total) by mouth 2 (two) times daily.  . aspirin EC 81 MG tablet Take 1 tablet (81 mg total) by mouth daily.  . atorvastatin (LIPITOR) 20 MG tablet Take 1 tablet (20 mg total) by mouth daily.  . Calcium Carbonate-Vit D-Min (CALCIUM 600+D PLUS MINERALS) 600-400 MG-UNIT TABS Take by mouth.  . cyclophosphamide (CYTOXAN) 50 MG capsule Take 10 capsules (500 mg total) by mouth once a week. Take 300 mg/m2 by mouth with breakfast once weekly. Take with food to minimize GI upset. Take early in the day and maintain hydration.  . dexamethasone (DECADRON) 4 MG tablet Take 5 tablets (20 mg total) by mouth once a week. Take on the day of chemo  . doxycycline (VIBRA-TABS) 100 MG tablet Take 100 mg by mouth as needed.  . metoprolol succinate (TOPROL-XL) 25 MG 24 hr tablet Take 1 tablet (25 mg total) by mouth daily.  . ondansetron (ZOFRAN) 8 MG tablet Take 1 tablet (8 mg total) by mouth every 8 (eight) hours as needed for nausea or vomiting.  . [DISCONTINUED] prochlorperazine (COMPAZINE) 10 MG tablet Take 1 tablet (10 mg total) by mouth every 6 (six) hours as needed (Nausea or vomiting).   No facility-administered encounter medications on file as of 06/08/2019.    Activities of Daily Living In your present state of health, do you have any difficulty performing the following activities: 06/08/2019  Hearing? N  Vision? N  Difficulty concentrating or making decisions? N  Walking or climbing stairs? N  Comment occassional knee problems  Dressing or bathing? N  Doing errands, shopping? N  Preparing Food and eating ? N  Using the Toilet? N  In the past six  months, have you accidently leaked urine? N  Do you have problems with loss of bowel control? N  Managing your Medications? N  Managing your Finances? N  Housekeeping or managing your Housekeeping? N  Some recent data might be hidden   Patient Care Team: [name], William A, MD as PCP - General (Family Medicine) Nelson, Katarina H, MD as PCP - Cardiology (Cardiology) Fogleman, Kelly, MD as Attending Physician (Obstetrics and Gynecology) Gasparetto, Cristina, MD as Consulting Physician (Internal Medicine) Feng, Yan, MD as Consulting Physician (Hematology) Groat, Christopher, MD as Consulting Physician (Ophthalmology) Nelson, Katarina H, MD as Consulting Physician (Cardiology) Whitworth, Walter, MD as Consulting Physician (Dermatology) Michelle Mottinger (Dentistry) Mayer, Gregory, DPM as Consulting Physician (Podiatry) Pleasant, Frances H, RN as [facility] Care Management    Assessment:   This is a routine wellness examination for Shannon Rush.  Exercise Activities and Dietary recommendations Current Exercise Habits: Home exercise routine, Type of exercise: walking, Time (Minutes): 60, Frequency (Times/Week): 5, Weekly Exercise (Minutes/Week): 300, Intensity: Moderate, Exercise limited by: orthopedic condition(s)  Goals    . Exercise 150 minutes per week (moderate activity)     Continue current regimen       Fall Risk Fall Risk  06/08/2019 11/24/2018 09/23/2018 08/18/2018 03/10/2018  Falls in the past year? 0 0 0 0 Yes  Number falls in past yr: - - - - 1  Injury with Fall? - - - - Yes  Comment - - - - fractured   hip  Risk Factor Category  - - - - -  Comment - - - - -  Risk for fall due to : - - - - -  Follow up - - - - -   Is the patient's home free of loose throw rugs in walkways, pet beds, electrical cords, etc?   yes      Grab bars in the bathroom? yes      Handrails on the stairs?   yes      Adequate lighting?   yes  Patient rating of health (0-10) scale: 5   Depression Screen PHQ 2/9 Scores 06/08/2019 09/23/2018 08/18/2018 03/10/2018  PHQ - 2 Score 0 0 0 0  PHQ- 9 Score - - - -    Cognitive Function MMSE - Mini Mental State Exam 09/23/2018 12/26/2010  Orientation to time 5 5  Orientation to Place 5 5  Registration 3 3  Attention/ Calculation 5 5  Recall 3 3  Language- name 2 objects 2 2  Language- repeat 1 1  Language- follow 3 step command 3 3  Language- read & follow direction 1 1  Write a sentence 1 1  Copy design 1 1  Total score 30 30   6CIT Screen 06/08/2019 09/23/2018  What Year? 0 points 0 points  What month? 0 points 0 points  What time? 0 points 0 points  Count back from 20 0 points 0 points  Months in reverse 0 points 0 points  Repeat phrase 0 points 0 points  Total Score 0 0   Immunization History  Administered Date(s) Administered  . DTaP 04/20/2014, 03/22/2015  . Hepatitis B, adult 04/20/2014, 03/22/2015  . HiB (PRP-T) 04/20/2014, 03/22/2015  . IPV 04/20/2014, 03/22/2015  . Influenza Split 04/17/2012  . Influenza Whole 06/02/2009  . Influenza, High Dose Seasonal PF 04/15/2016  . Influenza,inj,Quad PF,6+ Mos 05/12/2013, 04/11/2014, 04/20/2015, 04/14/2018  . Influenza-Unspecified 04/18/2017, 02/27/2019  . Pneumococcal Conjugate-13 09/03/2013, 10/01/2013, 04/20/2014, 03/22/2015  . Pneumococcal Polysaccharide-23 11/04/2014  . Td 07/15/2001, 11/20/2013  . Zoster Recombinat (Shingrix) 03/10/2019   Screening Tests Health Maintenance  Topic Date Due  . COLONOSCOPY  07/21/2020  . MAMMOGRAM  07/24/2020  . TETANUS/TDAP  11/21/2023  . INFLUENZA VACCINE  Completed  . DEXA SCAN  Completed  . PNA vac Low Risk Adult  Completed   Cancer Screenings: Lung: Low Dose CT Chest recommended if Age 36-80 years, 30 pack-year currently smoking OR have quit w/in 15years. Patient does not qualify. Breast:  Up to date on Mammogram? Yes   Up to date of Bone Density/Dexa? Yes Colorectal: Yes, next due 07/2020  Additional Screenings:  Hepatitis C Screening: Completed  PNA Vaccine: Completed     Plan:  Continue to stay positive throughout the Pandemic.  Enjoy walking Brayden and your bike rides!   I have personally reviewed and noted the following in the patient's chart:   . Medical and social history . Use of alcohol, tobacco or illicit drugs  . Current medications and supplements . Functional ability and status . Nutritional status . Physical activity . Advanced directives . List of other physicians . Hospitalizations, surgeries, and ER visits in previous 12 months . Vitals . Screenings to include cognitive, depression, and falls . Referrals and appointments  In addition, I have reviewed and discussed with patient certain preventive protocols, quality metrics, and best practice recommendations. A written personalized care plan for preventive services as well as general preventive health recommendations were provided to patient.  This visit  was conducted virtually in the setting of the [place] pandemic.    Dorna Bloom, CMA  06/08/2019   I have reviewed this visit and agree with the documentation.

## 2019-06-08 NOTE — Telephone Encounter (Signed)
Scheduled appt per 11/23 los.

## 2019-06-09 ENCOUNTER — Ambulatory Visit: Payer: Medicare Other

## 2019-06-14 ENCOUNTER — Other Ambulatory Visit: Payer: Self-pay

## 2019-06-14 ENCOUNTER — Encounter: Payer: Self-pay | Admitting: Hematology

## 2019-06-14 ENCOUNTER — Inpatient Hospital Stay: Payer: Medicare Other

## 2019-06-14 ENCOUNTER — Telehealth: Payer: Self-pay

## 2019-06-14 ENCOUNTER — Ambulatory Visit (HOSPITAL_COMMUNITY)
Admission: RE | Admit: 2019-06-14 | Discharge: 2019-06-14 | Disposition: A | Discharge status: Home / Self Care | Payer: Medicare Other | Source: Ambulatory Visit | Attending: Hematology | Admitting: Hematology

## 2019-06-14 ENCOUNTER — Inpatient Hospital Stay (HOSPITAL_BASED_OUTPATIENT_CLINIC_OR_DEPARTMENT_OTHER): Payer: Medicare Other | Admitting: Hematology

## 2019-06-14 VITALS — BP 106/60 | HR 88 | Temp 98.3°F | Resp 18

## 2019-06-14 DIAGNOSIS — C9000 Multiple myeloma not having achieved remission: Secondary | ICD-10-CM

## 2019-06-14 DIAGNOSIS — I252 Old myocardial infarction: Secondary | ICD-10-CM | POA: Diagnosis not present

## 2019-06-14 DIAGNOSIS — Z9221 Personal history of antineoplastic chemotherapy: Secondary | ICD-10-CM | POA: Diagnosis not present

## 2019-06-14 DIAGNOSIS — R739 Hyperglycemia, unspecified: Secondary | ICD-10-CM

## 2019-06-14 DIAGNOSIS — T380X5A Adverse effect of glucocorticoids and synthetic analogues, initial encounter: Secondary | ICD-10-CM

## 2019-06-14 DIAGNOSIS — C9002 Multiple myeloma in relapse: Secondary | ICD-10-CM | POA: Diagnosis not present

## 2019-06-14 DIAGNOSIS — Z9481 Bone marrow transplant status: Secondary | ICD-10-CM | POA: Diagnosis not present

## 2019-06-14 DIAGNOSIS — Z5112 Encounter for antineoplastic immunotherapy: Secondary | ICD-10-CM | POA: Diagnosis not present

## 2019-06-14 DIAGNOSIS — E785 Hyperlipidemia, unspecified: Secondary | ICD-10-CM | POA: Diagnosis not present

## 2019-06-14 DIAGNOSIS — M818 Other osteoporosis without current pathological fracture: Secondary | ICD-10-CM | POA: Diagnosis not present

## 2019-06-14 DIAGNOSIS — D6481 Anemia due to antineoplastic chemotherapy: Secondary | ICD-10-CM | POA: Diagnosis not present

## 2019-06-14 DIAGNOSIS — G629 Polyneuropathy, unspecified: Secondary | ICD-10-CM | POA: Diagnosis not present

## 2019-06-14 DIAGNOSIS — Z79899 Other long term (current) drug therapy: Secondary | ICD-10-CM | POA: Diagnosis not present

## 2019-06-14 DIAGNOSIS — Z7982 Long term (current) use of aspirin: Secondary | ICD-10-CM | POA: Diagnosis not present

## 2019-06-14 DIAGNOSIS — D61818 Other pancytopenia: Secondary | ICD-10-CM | POA: Diagnosis not present

## 2019-06-14 DIAGNOSIS — T451X5A Adverse effect of antineoplastic and immunosuppressive drugs, initial encounter: Secondary | ICD-10-CM

## 2019-06-14 DIAGNOSIS — M25552 Pain in left hip: Secondary | ICD-10-CM | POA: Diagnosis not present

## 2019-06-14 DIAGNOSIS — Z923 Personal history of irradiation: Secondary | ICD-10-CM | POA: Diagnosis not present

## 2019-06-14 LAB — CMP (CANCER CENTER ONLY)
ALT: 32 U/L (ref 0–44)
AST: 17 U/L (ref 15–41)
Albumin: 3.3 g/dL — ABNORMAL LOW (ref 3.5–5.0)
Alkaline Phosphatase: 78 U/L (ref 38–126)
Anion gap: 9 (ref 5–15)
BUN: 16 mg/dL (ref 8–23)
CO2: 23 mmol/L (ref 22–32)
Calcium: 9.1 mg/dL (ref 8.9–10.3)
Chloride: 103 mmol/L (ref 98–111)
Creatinine: 0.73 mg/dL (ref 0.44–1.00)
GFR, Est AFR Am: >60 mL/min (ref >60)
GFR, Estimated: >60 mL/min (ref >60)
Glucose, Bld: 100 mg/dL — ABNORMAL HIGH (ref 70–99)
Potassium: 4.2 mmol/L (ref 3.5–5.1)
Sodium: 135 mmol/L (ref 135–145)
Total Bilirubin: 0.5 mg/dL (ref 0.3–1.2)
Total Protein: 6.6 g/dL (ref 6.5–8.1)

## 2019-06-14 LAB — CBC WITH DIFFERENTIAL (CANCER CENTER ONLY)
Abs Immature Granulocytes: 0.04 10*3/uL (ref 0.00–0.07)
Basophils Absolute: 0 10*3/uL (ref 0.0–0.1)
Basophils Relative: 0 %
Eosinophils Absolute: 0.1 10*3/uL (ref 0.0–0.5)
Eosinophils Relative: 2 %
HCT: 26.5 % — ABNORMAL LOW (ref 36.0–46.0)
Hemoglobin: 8.9 g/dL — ABNORMAL LOW (ref 12.0–15.0)
Immature Granulocytes: 1 %
Lymphocytes Relative: 3 %
Lymphs Abs: 0.1 10*3/uL — ABNORMAL LOW (ref 0.7–4.0)
MCH: 34.5 pg — ABNORMAL HIGH (ref 26.0–34.0)
MCHC: 33.6 g/dL (ref 30.0–36.0)
MCV: 102.7 fL — ABNORMAL HIGH (ref 80.0–100.0)
Monocytes Absolute: 0.2 10*3/uL (ref 0.1–1.0)
Monocytes Relative: 8 %
Neutro Abs: 2.5 10*3/uL (ref 1.7–7.7)
Neutrophils Relative %: 86 %
Platelet Count: 125 10*3/uL — ABNORMAL LOW (ref 150–400)
RBC: 2.58 MIL/uL — ABNORMAL LOW (ref 3.87–5.11)
RDW: 16.9 % — ABNORMAL HIGH (ref 11.5–15.5)
WBC Count: 2.9 10*3/uL — ABNORMAL LOW (ref 4.0–10.5)
nRBC: 0 % (ref 0.0–0.2)

## 2019-06-14 MED ORDER — PROCHLORPERAZINE MALEATE 10 MG PO TABS: 10.0000 mg | ORAL_TABLET | Freq: Four times a day (QID) | ORAL | 1 refills | Status: AC | PRN

## 2019-06-14 MED ORDER — PALONOSETRON HCL INJECTION 0.25 MG/5ML
0.2500 mg | Freq: Once | INTRAVENOUS | Status: AC
  Administered 2019-06-14: 0.25 mg via INTRAVENOUS

## 2019-06-14 MED ORDER — DEXTROSE 5 % IV SOLN
19.0000 mg/m2 | Freq: Once | INTRAVENOUS | Status: AC
  Administered 2019-06-14: 30 mg via INTRAVENOUS
  Filled 2019-06-14: qty 15

## 2019-06-14 MED ORDER — SODIUM CHLORIDE 0.9 % IV SOLN
Freq: Once | INTRAVENOUS | Status: AC
  Administered 2019-06-14: 11:00:00 via INTRAVENOUS
  Filled 2019-06-14: qty 250

## 2019-06-14 MED ORDER — PALONOSETRON HCL INJECTION 0.25 MG/5ML
INTRAVENOUS | Status: AC
  Filled 2019-06-14: qty 5

## 2019-06-14 NOTE — Patient Instructions (Signed)
[facility] Discharge Instructions for Patients Receiving Chemotherapy  Today you received the following chemotherapy agents Kyprolis  To help prevent nausea and vomiting after your treatment, we encourage you to take your nausea medication as prescribed.   If you develop nausea and vomiting that is not controlled by your nausea medication, call the clinic.   BELOW ARE SYMPTOMS THAT SHOULD BE REPORTED IMMEDIATELY:  *FEVER GREATER THAN 100.5 F  *CHILLS WITH OR WITHOUT FEVER  NAUSEA AND VOMITING THAT IS NOT CONTROLLED WITH YOUR NAUSEA MEDICATION  *UNUSUAL SHORTNESS OF BREATH  *UNUSUAL BRUISING OR BLEEDING  TENDERNESS IN MOUTH AND THROAT WITH OR WITHOUT PRESENCE OF ULCERS  *URINARY PROBLEMS  *BOWEL PROBLEMS  UNUSUAL RASH Items with * indicate a potential emergency and should be followed up as soon as possible.  Feel free to call the clinic should you have any questions or concerns. The clinic phone number is [phone].  Please show the [facility] at check-in to the Emergency Department and triage nurse.  Carfilzomib injection What is this medicine? CARFILZOMIB (kar FILZ oh mib) targets a specific protein within cancer cells and stops the cancer cells from growing. It is used to treat multiple myeloma. This medicine may be used for other purposes; ask your health care provider or pharmacist if you have questions. COMMON BRAND NAME(S): KYPROLIS What should I tell my health care provider before I take this medicine? They need to know if you have any of these conditions:  heart disease  history of blood clots  irregular heartbeat  kidney disease  liver disease  lung or breathing disease  an unusual or allergic reaction to carfilzomib, or other medicines, foods, dyes, or preservatives  pregnant or trying to get pregnant  breast-feeding How should I use this medicine? This medicine is for injection or infusion into a vein. It is given by a  health care professional in a hospital or clinic setting. Talk to your pediatrician regarding the use of this medicine in children. Special care may be needed. Overdosage: If you think you have taken too much of this medicine contact a poison control center or emergency room at once. NOTE: This medicine is only for you. Do not share this medicine with others. What if I miss a dose? It is important not to miss your dose. Call your doctor or health care professional if you are unable to keep an appointment. What may interact with this medicine? Interactions are not expected. Give your health care provider a list of all the medicines, herbs, non-prescription drugs, or dietary supplements you use. Also tell them if you smoke, drink alcohol, or use illegal drugs. Some items may interact with your medicine. This list may not describe all possible interactions. Give your health care provider a list of all the medicines, herbs, non-prescription drugs, or dietary supplements you use. Also tell them if you smoke, drink alcohol, or use illegal drugs. Some items may interact with your medicine. What should I watch for while using this medicine? Your condition will be monitored carefully while you are receiving this medicine. Report any side effects. Continue your course of treatment even though you feel ill unless your doctor tells you to stop. You may need blood work done while you are taking this medicine. Do not become pregnant while taking this medicine or for at least 6 months after stopping it. Women should inform their doctor if they wish to become pregnant or think they might be pregnant. There is a  potential for serious side effects to an unborn child. Men should not father a child while taking this medicine and for at least 3 months after stopping it. Talk to your health care professional or pharmacist for more information. Do not breast-feed an infant while taking this medicine or for 2 weeks after the  last dose. Check with your doctor or health care professional if you get an attack of severe diarrhea, nausea and vomiting, or if you sweat a lot. The loss of too much body fluid can make it dangerous for you to take this medicine. You may get dizzy. Do not drive, use machinery, or do anything that needs mental alertness until you know how this medicine affects you. Do not stand or sit up quickly, especially if you are an older patient. This reduces the risk of dizzy or fainting spells. What side effects may I notice from receiving this medicine? Side effects that you should report to your doctor or health care professional as soon as possible:  allergic reactions like skin rash, itching or hives, swelling of the face, lips, or tongue  confusion  dizziness  feeling faint or lightheaded  fever or chills  palpitations  seizures  signs and symptoms of bleeding such as bloody or black, tarry stools; red or dark-brown urine; spitting up blood or brown material that looks like coffee grounds; red spots on the skin; unusual bruising or bleeding including from the eye, gums, or nose  signs and symptoms of a blood clot such as breathing problems; changes in vision; chest pain; severe, sudden headache; pain, swelling, warmth in the leg; trouble speaking; sudden numbness or weakness of the face, arm or leg  signs and symptoms of kidney injury like trouble passing urine or change in the amount of urine  signs and symptoms of liver injury like dark yellow or brown urine; general ill feeling or flu-like symptoms; light-colored stools; loss of appetite; nausea; right upper belly pain; unusually weak or tired; yellowing of the eyes or skin Side effects that usually do not require medical attention (report to your doctor or health care professional if they continue or are bothersome):  back pain  cough  diarrhea  headache  muscle cramps  vomiting This list may not describe all possible side  effects. Call your doctor for medical advice about side effects. You may report side effects to FDA at 1-800-FDA-1088. Where should I keep my medicine? This drug is given in a hospital or clinic and will not be stored at home. NOTE: This sheet is a summary. It may not cover all possible information. If you have questions about this medicine, talk to your doctor, pharmacist, or health care provider.  2020 Elsevier/Gold Standard (2017-04-16 14:07:13)  Coronavirus (COVID-19) Are you at risk?  Are you at risk for the Coronavirus (COVID-19)?  To be considered HIGH RISK for Coronavirus (COVID-19), you have to meet the following criteria:  . Traveled to [place], [place], [place], [place] or [place]; or in the [place] to [place], [place], [place], or [place]; and have fever, cough, and shortness of breath within the last 2 weeks of travel OR . Been in close contact with a person diagnosed with COVID-19 within the last 2 weeks and have fever, cough, and shortness of breath . IF YOU DO NOT MEET THESE CRITERIA, YOU ARE CONSIDERED LOW RISK FOR COVID-19.  What to do if you are HIGH RISK for COVID-19?  Marland Kitchen If you are having a medical emergency, call  911. . Seek medical care right away. Before you go to a doctor's office, urgent care or emergency department, call ahead and tell them about your recent travel, contact with someone diagnosed with COVID-19, and your symptoms. You should receive instructions from your physician's office regarding next steps of care.  . When you arrive at healthcare provider, tell the healthcare staff immediately you have returned from visiting [place], [place], [place], [place] or [place]; or traveled in the [place] to [place], [place], [place], or [place]; in the last two weeks or you have been in close contact with a person diagnosed with COVID-19 in the last 2 weeks.   . Tell the health care staff about your symptoms: fever, cough and shortness of  breath. . After you have been seen by a medical provider, you will be either: o Tested for (COVID-19) and discharged home on quarantine except to seek medical care if symptoms worsen, and asked to  - Stay home and avoid contact with others until you get your results (4-5 days)  - Avoid travel on public transportation if possible (such as bus, train, or airplane) or o Sent to the Emergency Department by EMS for evaluation, COVID-19 testing, and possible admission depending on your condition and test results.  What to do if you are LOW RISK for COVID-19?  Reduce your risk of any infection by using the same precautions used for avoiding the common cold or flu:  Marland Kitchen Wash your hands often with soap and warm water for at least 20 seconds.  If soap and water are not readily available, use an alcohol-based hand sanitizer with at least 60% alcohol.  . If coughing or sneezing, cover your mouth and nose by coughing or sneezing into the elbow areas of your shirt or coat, into a tissue or into your sleeve (not your hands). . Avoid shaking hands with others and consider head nods or verbal greetings only. . Avoid touching your eyes, nose, or mouth with unwashed hands.  . Avoid close contact with people who are sick. . Avoid places or events with large numbers of people in one location, like concerts or sporting events. . Carefully consider travel plans you have or are making. . If you are planning any travel outside or inside the [place], visit the CDC's Travelers' Health webpage for the latest health notices. . If you have some symptoms but not all symptoms, continue to monitor at home and seek medical attention if your symptoms worsen. . If you are having a medical emergency, call 911.   [place] / e-Visit: [email or web address]         [facility]: [facility]: [phone]                    [facility]: [phone]

## 2019-06-14 NOTE — Telephone Encounter (Signed)
Spoke with patient with hip x-ray results, per Dr. Burr Medico negative for any new fractures, hardware intact.  She is going to call Dr. Reather Littler office at [facility] to follow up.

## 2019-06-14 NOTE — Progress Notes (Signed)
Patient took her dexamethasone at home prior to today's treatment.   Demetrius Charity, PharmD, [facility] Pharmacist Pharmacy Phone: [phone] 06/14/2019

## 2019-06-15 ENCOUNTER — Telehealth: Payer: Self-pay | Admitting: Hematology

## 2019-06-15 NOTE — Telephone Encounter (Signed)
No los per 11/30. °

## 2019-06-16 DIAGNOSIS — C9002 Multiple myeloma in relapse: Secondary | ICD-10-CM | POA: Diagnosis not present

## 2019-06-16 DIAGNOSIS — C9000 Multiple myeloma not having achieved remission: Secondary | ICD-10-CM | POA: Diagnosis not present

## 2019-06-21 ENCOUNTER — Encounter: Payer: Self-pay | Admitting: Nurse Practitioner

## 2019-06-21 ENCOUNTER — Other Ambulatory Visit: Payer: Self-pay

## 2019-06-21 ENCOUNTER — Inpatient Hospital Stay (HOSPITAL_BASED_OUTPATIENT_CLINIC_OR_DEPARTMENT_OTHER): Payer: Medicare Other | Admitting: Nurse Practitioner

## 2019-06-21 ENCOUNTER — Inpatient Hospital Stay: Payer: Medicare Other | Attending: Hematology

## 2019-06-21 ENCOUNTER — Telehealth: Payer: Self-pay | Admitting: Radiation Oncology

## 2019-06-21 ENCOUNTER — Inpatient Hospital Stay: Payer: Medicare Other

## 2019-06-21 VITALS — BP 97/59 | HR 89 | Temp 98.0°F | Resp 18 | Wt 114.2 lb

## 2019-06-21 DIAGNOSIS — Z95828 Presence of other vascular implants and grafts: Secondary | ICD-10-CM

## 2019-06-21 DIAGNOSIS — C9002 Multiple myeloma in relapse: Secondary | ICD-10-CM | POA: Diagnosis not present

## 2019-06-21 DIAGNOSIS — D61818 Other pancytopenia: Secondary | ICD-10-CM | POA: Insufficient documentation

## 2019-06-21 DIAGNOSIS — Z5112 Encounter for antineoplastic immunotherapy: Secondary | ICD-10-CM | POA: Diagnosis not present

## 2019-06-21 DIAGNOSIS — Z9221 Personal history of antineoplastic chemotherapy: Secondary | ICD-10-CM | POA: Diagnosis not present

## 2019-06-21 DIAGNOSIS — Z7982 Long term (current) use of aspirin: Secondary | ICD-10-CM | POA: Diagnosis not present

## 2019-06-21 DIAGNOSIS — C9000 Multiple myeloma not having achieved remission: Secondary | ICD-10-CM | POA: Diagnosis not present

## 2019-06-21 DIAGNOSIS — Z79899 Other long term (current) drug therapy: Secondary | ICD-10-CM | POA: Insufficient documentation

## 2019-06-21 DIAGNOSIS — G629 Polyneuropathy, unspecified: Secondary | ICD-10-CM | POA: Insufficient documentation

## 2019-06-21 DIAGNOSIS — Z923 Personal history of irradiation: Secondary | ICD-10-CM | POA: Insufficient documentation

## 2019-06-21 DIAGNOSIS — R739 Hyperglycemia, unspecified: Secondary | ICD-10-CM

## 2019-06-21 DIAGNOSIS — Z9481 Bone marrow transplant status: Secondary | ICD-10-CM | POA: Diagnosis not present

## 2019-06-21 DIAGNOSIS — T380X5A Adverse effect of glucocorticoids and synthetic analogues, initial encounter: Secondary | ICD-10-CM

## 2019-06-21 LAB — HEMOGLOBIN A1C
Hgb A1c MFr Bld: 6.3 % — ABNORMAL HIGH (ref 4.8–5.6)
Mean Plasma Glucose: 134.11 mg/dL

## 2019-06-21 LAB — CMP (CANCER CENTER ONLY)
ALT: 21 U/L (ref 0–44)
AST: 16 U/L (ref 15–41)
Albumin: 3.2 g/dL — ABNORMAL LOW (ref 3.5–5.0)
Alkaline Phosphatase: 83 U/L (ref 38–126)
Anion gap: 8 (ref 5–15)
BUN: 17 mg/dL (ref 8–23)
CO2: 25 mmol/L (ref 22–32)
Calcium: 8.7 mg/dL — ABNORMAL LOW (ref 8.9–10.3)
Chloride: 105 mmol/L (ref 98–111)
Creatinine: 0.77 mg/dL (ref 0.44–1.00)
GFR, Est AFR Am: >60 mL/min (ref >60)
GFR, Estimated: >60 mL/min (ref >60)
Glucose, Bld: 91 mg/dL (ref 70–99)
Potassium: 4 mmol/L (ref 3.5–5.1)
Sodium: 138 mmol/L (ref 135–145)
Total Bilirubin: 0.5 mg/dL (ref 0.3–1.2)
Total Protein: 6.6 g/dL (ref 6.5–8.1)

## 2019-06-21 LAB — CBC WITH DIFFERENTIAL (CANCER CENTER ONLY)
Abs Immature Granulocytes: 0.05 10*3/uL (ref 0.00–0.07)
Basophils Absolute: 0 10*3/uL (ref 0.0–0.1)
Basophils Relative: 1 %
Eosinophils Absolute: 0.1 10*3/uL (ref 0.0–0.5)
Eosinophils Relative: 3 %
HCT: 26.6 % — ABNORMAL LOW (ref 36.0–46.0)
Hemoglobin: 8.9 g/dL — ABNORMAL LOW (ref 12.0–15.0)
Immature Granulocytes: 2 %
Lymphocytes Relative: 4 %
Lymphs Abs: 0.1 10*3/uL — ABNORMAL LOW (ref 0.7–4.0)
MCH: 34.2 pg — ABNORMAL HIGH (ref 26.0–34.0)
MCHC: 33.5 g/dL (ref 30.0–36.0)
MCV: 102.3 fL — ABNORMAL HIGH (ref 80.0–100.0)
Monocytes Absolute: 0.3 10*3/uL (ref 0.1–1.0)
Monocytes Relative: 12 %
Neutro Abs: 2.3 10*3/uL (ref 1.7–7.7)
Neutrophils Relative %: 78 %
Platelet Count: 134 10*3/uL — ABNORMAL LOW (ref 150–400)
RBC: 2.6 MIL/uL — ABNORMAL LOW (ref 3.87–5.11)
RDW: 17 % — ABNORMAL HIGH (ref 11.5–15.5)
WBC Count: 2.9 10*3/uL — ABNORMAL LOW (ref 4.0–10.5)
nRBC: 0 % (ref 0.0–0.2)

## 2019-06-21 MED ORDER — SODIUM CHLORIDE 0.9 % IV SOLN
Freq: Once | INTRAVENOUS | Status: AC
  Administered 2019-06-21: 09:00:00 via INTRAVENOUS
  Filled 2019-06-21: qty 250

## 2019-06-21 MED ORDER — DEXAMETHASONE 4 MG PO TABS: 20.0000 mg | ORAL_TABLET | Freq: Once | ORAL | Status: DC

## 2019-06-21 MED ORDER — DEXTROSE 5 % IV SOLN
69.0000 mg/m2 | Freq: Once | INTRAVENOUS | Status: AC
  Administered 2019-06-21: 110 mg via INTRAVENOUS
  Filled 2019-06-21: qty 10

## 2019-06-21 MED ORDER — DEXAMETHASONE 4 MG PO TABS
ORAL_TABLET | ORAL | Status: AC
  Filled 2019-06-21: qty 5

## 2019-06-21 MED ORDER — SODIUM CHLORIDE 0.9 % IV SOLN
Freq: Once | INTRAVENOUS | Status: DC
  Filled 2019-06-21: qty 250

## 2019-06-21 MED ORDER — ZOLEDRONIC ACID 4 MG/5ML IV CONC
3.5000 mg | Freq: Once | INTRAVENOUS | Status: AC
  Administered 2019-06-21: 3.5 mg via INTRAVENOUS
  Filled 2019-06-21: qty 4.38

## 2019-06-21 MED ORDER — PALONOSETRON HCL INJECTION 0.25 MG/5ML
INTRAVENOUS | Status: AC
  Filled 2019-06-21: qty 5

## 2019-06-21 MED ORDER — PALONOSETRON HCL INJECTION 0.25 MG/5ML
0.2500 mg | Freq: Once | INTRAVENOUS | Status: AC
  Administered 2019-06-21: 0.25 mg via INTRAVENOUS

## 2019-06-21 MED ORDER — ZOLEDRONIC ACID 4 MG/100ML IV SOLN: 4.0000 mg | Freq: Once | INTRAVENOUS | Status: DC

## 2019-06-21 MED FILL — CYCLOPHOSPHAMIDE 50 MG CAPS: 50 | 28 days supply | Qty: 40 | Fill #0

## 2019-06-21 NOTE — Patient Instructions (Signed)
[facility] Discharge Instructions for Patients Receiving Chemotherapy  Today you received the following chemotherapy agents:  Kyprolis  To help prevent nausea and vomiting after your treatment, we encourage you to take your nausea medication as directed.   If you develop nausea and vomiting that is not controlled by your nausea medication, call the clinic.   BELOW ARE SYMPTOMS THAT SHOULD BE REPORTED IMMEDIATELY:  *FEVER GREATER THAN 100.5 F  *CHILLS WITH OR WITHOUT FEVER  NAUSEA AND VOMITING THAT IS NOT CONTROLLED WITH YOUR NAUSEA MEDICATION  *UNUSUAL SHORTNESS OF BREATH  *UNUSUAL BRUISING OR BLEEDING  TENDERNESS IN MOUTH AND THROAT WITH OR WITHOUT PRESENCE OF ULCERS  *URINARY PROBLEMS  *BOWEL PROBLEMS  UNUSUAL RASH Items with * indicate a potential emergency and should be followed up as soon as possible.  Feel free to call the clinic should you have any questions or concerns. The clinic phone number is [phone].  Please show the CHEMO ALERT CARD at check-in to the Emergency Department and triage nurse.   

## 2019-06-21 NOTE — Progress Notes (Addendum)
Shannon Rush   [phone] [phone]   Clinic Follow up Note   Patient Care Team: [name], MD as PCP - General (Family Medicine) [name], MD as PCP - Cardiology (Cardiology) [name], MD as Attending Physician (Obstetrics and Gynecology) [name], MD as Consulting Physician (Internal Medicine) [name], MD as Consulting Physician (Hematology) [name], MD as Consulting Physician (Ophthalmology) [name], MD as Consulting Physician (Cardiology) [name], MD as Consulting Physician (Dermatology) [name] (Dentistry) [name], DPM as Consulting Physician (Podiatry) Pleasant, Eppie Gibson, RN as [place] Management 06/21/2019  CHIEF COMPLAINT: F/u MM   SUMMARY OF ONCOLOGIC HISTORY: Oncology History Overview Note  Multiple myeloma ([place])   Staging form: Multiple Myeloma, AJCC 6th Edition   - Clinical: Stage IIIA - Signed by [name], MD on 09/04/2013    Multiple myeloma ([place])  05/26/2012 Initial Diagnosis   Presenting IgG was 4,040 mg/dL on 06/05/2012 (IgA 35; IgM 34); kappa free light chain 34.4 mg/dL, lambda 0.00, kappa:lambda ratio of 34.75; SPEP with M-spike of 2.60.   06/30/2012 Imaging   Numerous lytic lesions throughout the calvarium.  Lytic lesion within the left lateral scapula.  Findings most compatible with metastases or myeloma. Multiple mid and lower thoracic compression fractures.  Slight compression through the endplates at L3.   73/40/3709 Bone Marrow Biopsy   Bone marrow biopsy showed 49% plasma cell. Normal classical cytogenetics; however, myeloma FISH panel showed 13q- (intermediate risk)       07/14/2012 - 07/27/2012 Radiation Therapy   Palliative radiation 20 Gy over 10 fractions between to thoracic spine cord compression and symptomatic left scapula lytic lesion.   08/10/2012 - 11/2012 Chemotherapy   Started SQ Velcade once  weekly, 3 weeks on, 1 weeks off; daily Revlimid d1-21, 7 days off; and Dexamethasone 12m PO weekly.    02/12/2013 Bone Marrow Transplant   [facility]    06/14/2013 Tumor Marker   IgG  1830,  Kappa:lamba ratio, 1.69 (baseline was 34.75 or   M-spike 0.28 (baseline of 2.6 or  89.3% of baseline).     06/25/2013 -  Chemotherapy   Started zometa 3.5 mg monthly    09/01/2013 - 01/2014 Chemotherapy   Maintenance therapy with revlimid 542mdaily for 14 days and then off for 7 (decreased from 21 days on and 7 days off based on neutropenia). Stopped due to disease progression 01/2017   09/13/2013 - 09/17/2014 Hospital Admission   Hospital admission for pneumonia   12/20/2013 Treatment Plan Change   Maintenance therapy decreased to 2.5 mg daily for 21 days and then off for 7 days based on low counts.    01/25/2014 Tumor Marker   IgG 1360 M spike 0.5   03/01/2014 - 03/05/2014 Hospital Ad[facility], received thrombolytic therapy, cath showed 60% prox RCA, mid-distal RCA 50%, Echo normal, no stents.   04/11/2014 - 08/07/2014 Chemotherapy   Continue Revlimid at 2.5 mg 3 weeks on 1 week off   08/08/2014 - 08/13/2014 Hospital Admission   Hospital admission for pneumonia. Her Revlimid was held.   08/29/2014 - 09/14/2014 Chemotherapy   Maintenance Revlimid restarted   09/14/2014 - 09/17/2014 Hospital Admission   [place].   11/13/2014 - 12/25/2015 Chemotherapy   Maintenance Revlimid restarted, changed to 2.58m88maily, 2 weks  on, 1 week off from 12/15/2014, stopped due to disease progression    01/24/2016 Progression   Patient is M protein has gradually increased to 1.0g, repeated a bone marrow biopsy showed plasma cell 10-30%   02/27/2016 - 03/07/2016 Chemotherapy   Pomalidomide 4 mg daily on day 1-21, dexamethasone 40 mg on day 1, 8 and 15, every 28 days,  started on 02/27/2016, daratumumab weekly started on 03/07/2016, held after first dose, due to hospitalization and a severe pancytopenia, pomalidomide was stopped afterwards    03/10/2016 - 03/29/2016 Hospital Admission   Pt was admitted for sepsis from pneumonia, and severe pancytopenia. She required ICU stay for a few days due to hypotension, developed b/l pleural effusion required diuretics and b/l thoracentesis. She also required blood and plt transfusion, and prolonged neupogen injection for severe neutropenia. She was discharged home after 3 weeks hospital stay    04/26/2016 - 04/26/2019 Chemotherapy   Daratumumab restarted on 04/26/2016, Velcade 1.28m/m2 and dexa 231mweekly start on 11/3, velcade held since 06/28/2016 due to CHF, dexa stopped on12/11/2017 due to high risk of fracture.  Dara reduced to every 4 weeks due to COVID-19 starting 10/23/18. Returned to every 3 weeks starting 12/17/18. Restarted every 2 weeks starting 02/22/19 Stopped on 04/26/19 due to disease progression.    08/26/2016 Echocardiogram   - Left ventricle: LVEF is approximately 35% with diffuse diffuse   hypokinesis with severe hypokinesis/ akinesis of the   inferior/inferoseptal walls. In comparison to echo images from   December 2017, there does not appear to be significant change The   cavity size was normal. Wall thickness was normal. Doppler   parameters are consistent with abnormal left ventricular   relaxation (grade 1 diastolic dysfunction). - Aortic valve: AV is thickened. 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 Present in echo from December 2017 There was   trivial regurgitation. - Right ventricle: Systolic function was mildly reduced. - Right atrium: The atrium was mildly dilated. - Pericardium, extracardiac: A trivial pericardial effusion was   identified.   12/03/2016 Echocardiogram   -Left Ventricle: Systolic function was   mildly to  moderately reduced. The estimated ejection fraction was   in the range of 40% to 45%. Diffuse hypokinesis. Doppler   parameters are consistent with abnormal left ventricular   relaxation (grade 1 diastolic dysfunction). Doppler parameters   are consistent with indeterminate ventricular filling pressure. - Left atrium: The atrium was severely dilated. - Tricuspid valve: There was trivial regurgitation. - Pulmonary arteries: Systolic pressure was within the normal   range. PA peak pressure: 33 mm Hg (S). - Global longitudinal strain -10.5% (abnormal)   03/12/2017 Echocardiogram   ECHO 03/12/17 Study Conclusions - Left ventricle: The cavity size was normal. Wall thickness was   increased in a pattern of mild LVH. Systolic function was normal.   The estimated ejection fraction was in the range of 50% to 55%.   There is hypokinesis of the basalinferior myocardium. Doppler   parameters are consistent with abnormal left ventricular   relaxation (grade 1 diastolic dysfunction). - Aortic valve: There was trivial regurgitation. - Mitral valve: Calcified annulus. Mildly thickened leaflets .   There was trivial regurgitation. - Tricuspid valve: There was mild regurgitation. Impressions: - There has been mild improvement in EF since prior study.    12/12/2017 - 12/15/2017 Hospital Admission   Admission diagnosis: Pnuemonia and acute hypoxic respiratory failure Additional comments: treated for pneumoania and acute  hypoxic respiratory failure before she was discharged on 12/15/17 with oral anitbiotics. She did not require home oxygen.    12/30/2017 - 01/02/2018 Hospital Admission   Admission diagnosis: Pnuemonia  Additional comments: She was again hospitalized on 12/30/17 for pneumonia, ID work up was otherwise negative, she was discharged home with oral antibiotics.    04/01/2018 Imaging   04/01/2018 CXR IMPRESSION: Persisting airspace disease superimposed on chronic changes, suggesting pneumonia  given the history. Followup PA and lateral chest X-ray is recommended in 3-4 weeks following therapy to ensure resolution and exclude underlying malignancy.  Redemonstration of mid and lower thoracic compression fractures with associated thoracic kyphosis.   04/23/2018 Echocardiogram   04/23/2018 ECHO LV EF: 45% -   50%   01/07/2019 Imaging   MRI Brain 01/07/19 IMPRESSION: MRI HEAD IMPRESSION:   1. 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 Lesion closely approximates the left inner ear structures and involves the left Meckel's cave and left fifth cranial nerve, and most likely is the causative etiology for patient's underlying left ear and facial symptoms. Follow-up examination with postcontrast imaging suggested for complete evaluation. Inclusion of IAC sequences would likely be helpful for visualization. 2. Additional 2.7 cm lesion involving the right occipital calvarium with adjacent marrow signal abnormality, also likely related to multiple myeloma. Metastatic disease would be the primary differential consideration. 3. Underlying age-appropriate cerebral atrophy with mild chronic microvascular ischemic disease. No other acute intracranial abnormality.   MRA HEAD IMPRESSION:   1. Negative intracranial MRA with no large vessel occlusion, hemodynamically significant stenosis, or other acute vascular abnormality. 2. Petrous and cavernous left ICA is partially encased and surrounded by the left petrous apex lesion without significant irregularity or stenosis. 3. 2 mm right paraophthalmic aneurysm.   MRA NECK IMPRESSION:   Normal MRA of the neck with wide patency of the carotid and vertebral arteries bilaterally. No hemodynamically significant stenosis or other acute vascular abnormality identified.    01/14/2019 - 01/27/2019 Radiation Therapy    Radaition to Left petrous apex bone lesion 01/14/19-01/27/19 with Dr. Tammi Klippel.    04/01/2019 Pathology Results   Bone Marrow biopsy 04/01/19  DIAGNOSIS: BONE MARROW, ASPIRATE, CLOT, CORE: - Normocellular marrow with minimal involvement by plasma cell neoplasm - See comment PERIPHERAL BLOOD: - Macrocytic anemia and leukopenia -See complete blood count   04/09/2019 PET scan   PET 04/09/19  IMPRESSION: 1. Scattered lytic lesions of bone. A few of these demonstrate hypermetabolic activity, most notably the right occipital bone lesion; the lytic lesion in the approximately C7 spinous process; the lytic expansile lesion of the left fourth rib anteriorly; and a small lytic focus in the right femoral head. Other scattered lytic lesions have only low-grade activity. 2. Multiple fractures as detailed above. 3. Other imaging findings of potential clinical significance: Aortic Atherosclerosis (ICD10-I70.0). Coronary atherosclerosis.   05/10/2019 -  Chemotherapy   Velcade q2weeeks and Dexa 72m weekly was added on 05/10/19. Switched to CyBorD (Oral cyclophosphamide CYTOXAN 5087m IV Velcade 1.9m60m2, and oral dexa 16m31meekly) starting 05/24/19. -changed Velcade to weekly Kyprolis 3 weeks on/1 week off after 2 doses on 06/14/19 due to neuropathy.      CURRENT THERAPY:  -CyBorD (cyclophosphamide (CYTOXAN) 500mg56m0mg/49mVelcade 1.9mg/m259mnd dexa 16mg, w69my) starting 05/24/19. Due to neuropathy after 2 doses of Velcade, switched to weekly Kyprolis 3 weeks on/1 week off after 2 doses on 06/14/19 -Restart Zometa injections  every 6 months on 06/21/19.   INTERVAL HISTORY: Shannon Rush returns for f/u and treatment as scheduled. She completed cycle 1 day 1 on 11/30. She was seen by her oncologist Dr. [name] at [facility] on 12/2. Her left hip pain has become more painful rather quickly. Pain is worse on walking and using stairs. She occasionally needs assistance in the yard. Uses rollator at home and  wheelchair at [facility]. Also the occipital lump is getting larger. She prefers not to use strong pain meds, has morphine but has not used it. She is interested in radiation now. She wakes up often during the night when changing positions. Neuropathy is stable. She has bruising but no bleeding. Denies fever, chills, cough, chest pain, or dyspnea. She has "chest issues" related to bone mets in the ribs and spine but does not think it's cardiac related. She manages constipation. Neuropathy is stable.    MEDICAL HISTORY:  Past Medical History:  Diagnosis Date  . CAD (coronary artery disease)    a. inf STEMI (in [place] >>> [facility]) >>> LHC (8/15- [place]):  pRCA 60%, mid to dist RCA 50% >>> med rx  . Cardiomyopathy ([place])   . Cataract   . Chronic combined systolic and diastolic CHF (congestive heart failure) ([place])    cancer medication related   . Cord compression ([place]) 07/13/12   MRI- diffuse myeloma involvement of T-L spine  . History of radiation therapy 07/13/12-07/27/12   spinal cord compression T3-T10,left scapula  . 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 Multiple myeloma ([place]) 07/01/2012  . MVP (mitral valve prolapse)    a. trivial by echo 06/2017.  . OSTEOPOROSIS 06/11/2010   Multiple compression fractures; and spontaneous fracture of sternum Qualifier: Diagnosis of  By: [name], [name]     . Thoracic kyphosis 07/13/12   per MRI scan  . Unspecified deficiency anemia     SURGICAL HISTORY: Past Surgical History:  Procedure Laterality Date  . APPENDECTOMY    . CATARACT EXTRACTION, BILATERAL    . CESAREAN SECTION     x2   . COLONOSCOPY  2007   neg with Dr. [name]  . ELBOW SURGERY    . HIP SURGERY  2009   left  . TUBAL LIGATION      I have reviewed the social history and family history with the patient and they are unchanged from previous note.  ALLERGIES:  is allergic to zithromax [azithromycin]; zosyn [piperacillin sod-tazobactam so]; and quinolones.   MEDICATIONS:  Current Outpatient Medications  Medication Sig Dispense Refill  . acetaminophen (TYLENOL) 500 MG tablet Take 500 mg by mouth every 6 (six) hours as needed for moderate pain or fever.    Marland Kitchen acyclovir (ZOVIRAX) 400 MG tablet Take 1 tablet (400 mg total) by mouth 2 (two) times daily. 180 tablet 3  . aspirin EC 81 MG tablet Take 1 tablet (81 mg total) by mouth daily. 90 tablet 3  . atorvastatin (LIPITOR) 20 MG tablet Take 1 tablet (20 mg total) by mouth daily. 10 tablet 0  . Calcium Carbonate-Vit D-Min (CALCIUM 600+D PLUS MINERALS) 600-400 MG-UNIT TABS Take by mouth.    . cyclophosphamide (CYTOXAN) 50 MG capsule Take 10 capsules (500 mg total) by mouth once a week. Take 300 mg/m2 by mouth with breakfast once weekly. Take with food to minimize GI upset. Take early in the day and maintain hydration. 40 capsule 3  . dexamethasone (DECADRON) 4 MG tablet Take 5 tablets (20 mg total)  by mouth once a week. Take on the day of chemo 20 tablet 3  . doxycycline (VIBRA-TABS) 100 MG tablet Take 100 mg by mouth as needed.    . metoprolol succinate (TOPROL-XL) 25 MG 24 hr tablet Take 1 tablet (25 mg total) by mouth daily. 15 tablet 0  . ondansetron (ZOFRAN) 8 MG tablet Take 1 tablet (8 mg total) by mouth every 8 (eight) hours as needed for nausea or vomiting. 20 tablet 3  . prochlorperazine (COMPAZINE) 10 MG tablet Take 1 tablet (10 mg total) by mouth every 6 (six) hours as needed (Nausea or vomiting). 30 tablet 1   No current facility-administered medications for this visit.     PHYSICAL EXAMINATION: ECOG PERFORMANCE STATUS: 1 - Symptomatic but completely ambulatory See infusion flow sheet for vital signs   GENERAL:alert, no distress and comfortable SKIN: no rash. Generalized bruising to arms  EYES: sclera clear LUNGS: clear with normal breathing effort HEART: regular rate & rhythm, no lower extremity edema ABDOMEN: abdomen soft, non-tender and normal bowel sounds Musculoskeletal: palpable  right occipital lesion  NEURO: alert & oriented x 3 with fluent speech  LABORATORY DATA:  I have reviewed the data as listed CBC Latest Ref Rng & Units 06/21/2019 06/14/2019 06/07/2019  WBC 4.0 - 10.5 K/uL 2.9(L) 2.9(L) 3.9(L)  Hemoglobin 12.0 - 15.0 g/dL 8.9(L) 8.9(L) 10.1(L)  Hematocrit 36.0 - 46.0 % 26.6(L) 26.5(L) 31.1(L)  Platelets 150 - 400 K/uL 134(L) 125(L) 153     CMP Latest Ref Rng & Units 06/21/2019 06/14/2019 06/07/2019  Glucose 70 - 99 mg/dL 91 100(H) 217(H)  BUN 8 - 23 mg/dL _0 Creatinine 0.44 - 1.00 mg/dL 0.77 0.73 0.93  Sodium 135 - 145 mmol/L 138 135 137  Potassium 3.5 - 5.1 mmol/L 4.0 4.2 4.0  Chloride 98 - 111 mmol/L 105 103 104  CO2 22 - 32 mmol/L 25 23 20(L)  Calcium 8.9 - 10.3 mg/dL 8.7(L) 9.1 9.4  Total Protein 6.5 - 8.1 g/dL 6.6 6.6 7.3  Total Bilirubin 0.3 - 1.2 mg/dL 0.5 0.5 1.0  Alkaline Phos 38 - 126 U/L 83 78 97  AST 15 - 41 U/L 16 17 40  ALT 0 - 44 U/L 21 32 45(H)      RADIOGRAPHIC STUDIES: I have personally reviewed the radiological images as listed and agreed with the findings in the report. No results found.   ASSESSMENT & PLAN: Shannon Rush a 78 y.o.femalewith    1. IgG kappa Multiple myeloma s/p Auto SCT, intermediate risk, relapsed in 01/2016 -Diagnosed in 05/2012. Treated with induction chemo, radiation and BM transplant, but unfortunately progressed while on maintenance Revlimid 01/2016. She is currently onDaratumumabmonotherapy -Dexa was stoppeddue to her high risk of fracture, and well controlled MM -During COVID19 treatment changed toevery 4 weeks, and changed back to every 3 weeks in early June -Due toher slight disease progressionandsymptomatic bone lesion in left petrous apex, Dr. Burr Medico changed Dara back to q2 weeks. -Due to increase in M protein from 0.3 to 0.6, she underwent restaging bone marrow biopsy in 03/2019 which shows normocellular marrow and minimal involvement by plasma cell neoplasm (2%) -She also  underwent PET scan in 03/2019 which shows scattered lytic lesions, some of which show hypermetabolic activity. She became symptomatic of the occipital lesion but ultimately decided to hold palliative RT after she discussed with Dr. Tammi Klippel -per the recommendation of Dr. Samule Ohm at [facility] and Dr. Burr Medico, she started CyBorD on 05/24/19. She developed worsening neuropathy after  first velcade inj and it was changed to kyprolis after second dose  -Shannon Rush appears stable today. She completed cycle 1 day 1 KCD, she tolerated well.  -However, she has worsening left hip pain and right occipital pain. Left hip pain is limiting activity. Plain film on 11/30 was negative for fracture, hardware is intact.  -This left hip pain is possibly related to MM, will discuss with Dr. Tammi Klippel to see if can treat with palliative RT, may require additional imaging first.  -labs reviewed. She will proceed with cycle 1 day 8 kyprolis, cytoxan, and dex (she took at home) today.  -she will return for f/u and day 15 next week, then off therapy 1 week.   2. Left petrous apex bone lesion and right occipital lesion  -Her inner ear issues and dizziness progressed and she developedmildbalance issueand nausea  -Her MRI brain from 01/07/19 showedabnormal osseous lesion measuring up to approximately 5.9 cm centered at the left petrous apex. Thismost likely is the cause of her underlyingleft ear and balancesymptoms. -S/p target RT with Dr Tammi Klippel 7/1-7/15. -She has stable muffled hearing and mild imbalanceissue, no significant improvement after RT -She feels the right occipital lesion is enlarging, and causing pain. She would like to reconsider palliative radiation. I will send Dr. Tammi Klippel a message   3. History of multiple pneumonia andbronchitis -She has had multiple episodes of pneumoniaand bronchitis, required hospitalizationandantibiotics -Her vaccines are up-to-date.  4. Pancytopenia -Secondary to Dara and  recently related to disease progression -s/p RBC for symptomatic anemia 7.7 on 11/20 -Hgb improved to 10.1 after RBC, stabilized at 8.9 today. PLT 134K  5. CHF, CAD, inferior STEMI in 02/2014, HLD -She is on ASA, BB, statin -f/u with Dr. Meda Coffee and continue medications -normal EF and improved strain on echo from 04/27/19, per cardiology PA ok to do echo q6 months for now  -will monitor cardiac enzymes on kyprolis   6. Osteoporosis with multiple fractures -Last DEXA in 2015 showed worsening osteoporosis. She declined repeat DEXA. -She had a fall induced fracture in 01/2018. -She was on Zometa for 4 years, stopped after April 2018 -She has left hip pain that worsened abruptly, worsened by walking and using stairs.  -Xray on 11/30 is negative for new fracture, hardware is intact. This is likely related to MM, she reportedly spoke to ortho who did not feel this was an ortho issue.  -Will discuss palliative RT with Dr. Tammi Klippel, the patient is interested. She prefers not to use opioids if she can tolerate the pain without it -Restart Zometa today, will dose adjust for CrCl    7. Peripheral neuropathy in feet, G1-2 -Secondary to previous chemo. -worse in feet than hands -recent tuning fork exam shows decreased vibratory sense up to her ankle.  -previously declined gabapentin -Due to worsening neuropathy after restarting Velcade, regimen was changed to kyprolis after 2 doses  -Stable  8. Goal of care discussion  -full code -treatment goal is palliative   PLAN: -Labs reviewed -Proceed with cycle 1 day 8 kyprolis, cytoxan, dex (days 1, 8, 15 every 28 days) -Return for f/u and day 15 chemo next week, then off 1 week -Restart Zometa today, dose adjust for CrCl -Monitor cardiac enzymes on kyprolis  -Discuss RT with Dr. Tammi Klippel   All questions were answered. The patient knows to call the clinic with any problems, questions or concerns. No barriers to learning was detected.     Alla Feeling, NP 06/21/19   Addendum  I  have seen the patient, examined her. I agree with the assessment and and plan and have edited the notes.   Shannon Rush still has significant left hip pian, although overall better than last week. She saw [facility]. [name] at [facility] last week, will continue KCD, and restart zometa. Refer to rad/onc Dr. Tammi Klippel for her symptomatic left hip pain and right occipitals lesion.   Will continue f/u her closely.   [name]  06/21/2019

## 2019-06-21 NOTE — Telephone Encounter (Signed)
Received voicemail message from patient. She is inquiring if she needs an appointment for Dr. Tammi Klippel to treat her left hip to reduce the pain she feels. She reports she is still able to ride her bike but has considerable pain after. She request a call back at [phone] or [phone].

## 2019-06-21 NOTE — Progress Notes (Signed)
Resuming Zometa. CrCl = 50 mL/min today.  Will dose adj Zometa to 3.5 mg per protocol. PA approved for Zometa. Kennith Center, Pharm.D., CPP 06/21/2019@10 :07 AM

## 2019-06-21 NOTE — Progress Notes (Signed)
Per Regan Rakers NP hold decadron 20 mg po since patient took dose at home

## 2019-06-22 ENCOUNTER — Telehealth: Payer: Self-pay | Admitting: Nurse Practitioner

## 2019-06-22 NOTE — Telephone Encounter (Signed)
Scheduled appt per 12/7 los.  Called pt and was not able to reach pt. Patient had her next appt scheduled.  Patient can get a print out at her next scheduled appts

## 2019-06-27 NOTE — Progress Notes (Signed)
Shannon Rush   [phone] [phone]   Clinic Follow up Note   Patient Care Team: [name], MD as PCP - General (Family Medicine) [name], MD as PCP - Cardiology (Cardiology) [name], MD as Attending Physician (Obstetrics and Gynecology) [name], MD as Consulting Physician (Internal Medicine) [name], MD as Consulting Physician (Hematology) [name], MD as Consulting Physician (Ophthalmology) [name], MD as Consulting Physician (Cardiology) [name], MD as Consulting Physician (Dermatology) [name] (Dentistry) [name], DPM as Consulting Physician (Podiatry) Pleasant, Eppie Gibson, RN as [facility] 06/28/2019  CHIEF COMPLAINT: F/u MM  SUMMARY OF ONCOLOGIC HISTORY: Oncology History Overview Note  Multiple myeloma ([place])   Staging form: Multiple Myeloma, AJCC 6th Edition   - Clinical: Stage IIIA - Signed by [name], MD on 09/04/2013    Multiple myeloma ([place])  05/26/2012 Initial Diagnosis   Presenting IgG was 4,040 mg/dL on 06/05/2012 (IgA 35; IgM 34); kappa free light chain 34.4 mg/dL, lambda 0.00, kappa:lambda ratio of 34.75; SPEP with M-spike of 2.60.   06/30/2012 Imaging   Numerous lytic lesions throughout the calvarium.  Lytic lesion within the left lateral scapula.  Findings most compatible with metastases or myeloma. Multiple mid and lower thoracic compression fractures.  Slight compression through the endplates at L3.   79/39/0300 Bone Marrow Biopsy   Bone marrow biopsy showed 49% plasma cell. Normal classical cytogenetics; however, myeloma FISH panel showed 13q- (intermediate risk)       07/14/2012 - 07/27/2012 Radiation Therapy   Palliative radiation 20 Gy over 10 fractions between to thoracic spine cord compression and symptomatic left scapula lytic lesion.   08/10/2012 - 11/2012 Chemotherapy   Started SQ Velcade once  weekly, 3 weeks on, 1 weeks off; daily Revlimid d1-21, 7 days off; and Dexamethasone 863m PO weekly.    02/12/2013 Bone Marrow Transplant   [facility]    06/14/2013 Tumor Marker   IgG  1830,  Kappa:lamba ratio, 1.69 (baseline was 34.75 or   M-spike 0.28 (baseline of 2.6 or  89.3% of baseline).     06/25/2013 -  Chemotherapy   Started zometa 3.5 mg monthly    09/01/2013 - 01/2014 Chemotherapy   Maintenance therapy with revlimid 571mdaily for 14 days and then off for 7 (decreased from 21 days on and 7 days off based on neutropenia). Stopped due to disease progression 01/2017   09/13/2013 - 09/17/2014 Hospital Admission   Hospital admission for pneumonia   12/20/2013 Treatment Plan Change   Maintenance therapy decreased to 2.5 mg daily for 21 days and then off for 7 days based on low counts.    01/25/2014 Tumor Marker   IgG 1360 M spike 0.5   03/01/2014 - 03/05/2014 Hospital Admission   [facility], received thrombolytic therapy, cath showed 60% prox RCA, mid-distal RCA 50%, Echo normal, no stents.   04/11/2014 - 08/07/2014 Chemotherapy   Continue Revlimid at 2.5 mg 3 weeks on 1 week off   08/08/2014 - 08/13/2014 Hospital Admission   Hospital admission for pneumonia. Her Revlimid was held.   08/29/2014 - 09/14/2014 Chemotherapy   Maintenance Revlimid restarted   09/14/2014 - 09/17/2014 Hospital Admission   [place].   11/13/2014 - 12/25/2015 Chemotherapy   Maintenance Revlimid restarted, changed to 2.63m54maily, 2 weks on,  1 week off from 12/15/2014, stopped due to disease progression    01/24/2016 Progression   Patient is M protein has gradually increased to 1.0g, repeated a bone marrow biopsy showed plasma cell 10-30%   02/27/2016 - 03/07/2016 Chemotherapy   Pomalidomide 4 mg daily on day 1-21, dexamethasone 40 mg on day 1, 8 and 15, every 28 days,  started on 02/27/2016, daratumumab weekly started on 03/07/2016, held after first dose, due to hospitalization and a severe pancytopenia, pomalidomide was stopped afterwards    03/10/2016 - 03/29/2016 Hospital Admission   Pt was admitted for sepsis from pneumonia, and severe pancytopenia. She required ICU stay for a few days due to hypotension, developed b/l pleural effusion required diuretics and b/l thoracentesis. She also required blood and plt transfusion, and prolonged neupogen injection for severe neutropenia. She was discharged home after 3 weeks hospital stay    04/26/2016 - 04/26/2019 Chemotherapy   Daratumumab restarted on 04/26/2016, Velcade 1.75m/m2 and dexa 216mweekly start on 11/3, velcade held since 06/28/2016 due to CHF, dexa stopped on12/11/2017 due to high risk of fracture.  Dara reduced to every 4 weeks due to COVID-19 starting 10/23/18. Returned to every 3 weeks starting 12/17/18. Restarted every 2 weeks starting 02/22/19 Stopped on 04/26/19 due to disease progression.    08/26/2016 Echocardiogram   - Left ventricle: LVEF is approximately 35% with diffuse diffuse   hypokinesis with severe hypokinesis/ akinesis of the   inferior/inferoseptal walls. In comparison to echo images from   December 2017, there does not appear to be significant change The   cavity size was normal. Wall thickness was normal. Doppler   parameters are consistent with abnormal left ventricular   relaxation (grade 1 diastolic dysfunction). - Aortic valve: AV is thickened. 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 Present in echo from December 2017 There was   trivial regurgitation. - Right ventricle: Systolic function was mildly reduced. - Right atrium: The atrium was mildly dilated. - Pericardium, extracardiac: A trivial pericardial effusion was   identified.   12/03/2016 Echocardiogram   -Left Ventricle: Systolic function was   mildly to  moderately reduced. The estimated ejection fraction was   in the range of 40% to 45%. Diffuse hypokinesis. Doppler   parameters are consistent with abnormal left ventricular   relaxation (grade 1 diastolic dysfunction). Doppler parameters   are consistent with indeterminate ventricular filling pressure. - Left atrium: The atrium was severely dilated. - Tricuspid valve: There was trivial regurgitation. - Pulmonary arteries: Systolic pressure was within the normal   range. PA peak pressure: 33 mm Hg (S). - Global longitudinal strain -10.5% (abnormal)   03/12/2017 Echocardiogram   ECHO 03/12/17 Study Conclusions - Left ventricle: The cavity size was normal. Wall thickness was   increased in a pattern of mild LVH. Systolic function was normal.   The estimated ejection fraction was in the range of 50% to 55%.   There is hypokinesis of the basalinferior myocardium. Doppler   parameters are consistent with abnormal left ventricular   relaxation (grade 1 diastolic dysfunction). - Aortic valve: There was trivial regurgitation. - Mitral valve: Calcified annulus. Mildly thickened leaflets .   There was trivial regurgitation. - Tricuspid valve: There was mild regurgitation. Impressions: - There has been mild improvement in EF since prior study.    12/12/2017 - 12/15/2017 Hospital Admission   Admission diagnosis: Pnuemonia and acute hypoxic respiratory failure Additional comments: treated for pneumoania and acute hypoxic  respiratory failure before she was discharged on 12/15/17 with oral anitbiotics. She did not require home oxygen.    12/30/2017 - 01/02/2018 Hospital Admission   Admission diagnosis: Pnuemonia  Additional comments: She was again hospitalized on 12/30/17 for pneumonia, ID work up was otherwise negative, she was discharged home with oral antibiotics.    04/01/2018 Imaging   04/01/2018 CXR IMPRESSION: Persisting airspace disease superimposed on chronic changes, suggesting pneumonia  given the history. Followup PA and lateral chest X-ray is recommended in 3-4 weeks following therapy to ensure resolution and exclude underlying malignancy.  Redemonstration of mid and lower thoracic compression fractures with associated thoracic kyphosis.   04/23/2018 Echocardiogram   04/23/2018 ECHO LV EF: 45% -   50%   01/07/2019 Imaging   MRI Brain 01/07/19 IMPRESSION: MRI HEAD IMPRESSION:   1. 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 Lesion closely approximates the left inner ear structures and involves the left Meckel's cave and left fifth cranial nerve, and most likely is the causative etiology for patient's underlying left ear and facial symptoms. Follow-up examination with postcontrast imaging suggested for complete evaluation. Inclusion of IAC sequences would likely be helpful for visualization. 2. Additional 2.7 cm lesion involving the right occipital calvarium with adjacent marrow signal abnormality, also likely related to multiple myeloma. Metastatic disease would be the primary differential consideration. 3. Underlying age-appropriate cerebral atrophy with mild chronic microvascular ischemic disease. No other acute intracranial abnormality.   MRA HEAD IMPRESSION:   1. Negative intracranial MRA with no large vessel occlusion, hemodynamically significant stenosis, or other acute vascular abnormality. 2. Petrous and cavernous left ICA is partially encased and surrounded by the left petrous apex lesion without significant irregularity or stenosis. 3. 2 mm right paraophthalmic aneurysm.   MRA NECK IMPRESSION:   Normal MRA of the neck with wide patency of the carotid and vertebral arteries bilaterally. No hemodynamically significant stenosis or other acute vascular abnormality identified.    01/14/2019 - 01/27/2019 Radiation Therapy    Radaition to Left petrous apex bone lesion 01/14/19-01/27/19 with Dr. Tammi Klippel.    04/01/2019 Pathology Results   Bone Marrow biopsy 04/01/19  DIAGNOSIS: BONE MARROW, ASPIRATE, CLOT, CORE: - Normocellular marrow with minimal involvement by plasma cell neoplasm - See comment PERIPHERAL BLOOD: - Macrocytic anemia and leukopenia -See complete blood count   04/09/2019 PET scan   PET 04/09/19  IMPRESSION: 1. Scattered lytic lesions of bone. A few of these demonstrate hypermetabolic activity, most notably the right occipital bone lesion; the lytic lesion in the approximately C7 spinous process; the lytic expansile lesion of the left fourth rib anteriorly; and a small lytic focus in the right femoral head. Other scattered lytic lesions have only low-grade activity. 2. Multiple fractures as detailed above. 3. Other imaging findings of potential clinical significance: Aortic Atherosclerosis (ICD10-I70.0). Coronary atherosclerosis.   05/10/2019 -  Chemotherapy   Velcade q2weeeks and Dexa 61m weekly was added on 05/10/19. Switched to CyBorD (Oral cyclophosphamide CYTOXAN 5040m IV Velcade 1.35m66m2, and oral dexa 62m29meekly) starting 05/24/19. -changed Velcade to weekly Kyprolis 3 weeks on/1 week off after 2 doses on 06/14/19 due to neuropathy.      CURRENT THERAPY:  -CyBorD (cyclophosphamide(CYTOXAN)500mg57m0mg/110mVelcade 1.35mg/m232mnd dexa 62mg, w65my) starting 05/24/19.Due toneuropathyafter 2 doses of Velcade, switched toweeklyKyprolis 3 weeks on/1 week off after 2 doses on 06/14/19 -Restart Zometa injections every 6 months on 06/21/19.  INTERVAL HISTORY:  Shannon Rush returns for f/u and treatment as scheduled. She completed cycle 1 day 8 chemo on 06/21/19. She feels the same. Left hip pain is "noticeably better." She does not require medication. She is not in a wheelchair today, mobility is improved. Right skull remains slightly tender and she thinks its growing. Has CT sim with Dr.  Tammi Klippel tomorrow. Otherwise, appetite is good. Constipation managed with miralax and prunes. Denies n/v. Neuropathy is stable. Sleep is altered first 2 days after taking Dex. She had 2 "not tiny" nosebleeds last week, none in 4-5 days and denies other bleeding. No fever, chills, cough, chest pain, dyspnea. Mild leg swelling, she did not wear compression stockings today.    MEDICAL HISTORY:  Past Medical History:  Diagnosis Date  . CAD (coronary artery disease)    a. inf STEMI (in [place] >>> [facility]) >>> LHC (8/15- [place]):  pRCA 60%, mid to dist RCA 50% >>> med rx  . Cardiomyopathy ([place])   . Cataract   . Chronic combined systolic and diastolic CHF (congestive heart failure) ([place])    cancer medication related   . Cord compression ([place]) 07/13/12   MRI- diffuse myeloma involvement of T-L spine  . History of radiation therapy 07/13/12-07/27/12   spinal cord compression T3-T10,left scapula  . 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 Multiple myeloma ([place]) 07/01/2012  . MVP (mitral valve prolapse)    a. trivial by echo 06/2017.  . OSTEOPOROSIS 06/11/2010   Multiple compression fractures; and spontaneous fracture of sternum Qualifier: Diagnosis of  By: [name], [name]     . Thoracic kyphosis 07/13/12   per MRI scan  . Unspecified deficiency anemia     SURGICAL HISTORY: Past Surgical History:  Procedure Laterality Date  . APPENDECTOMY    . CATARACT EXTRACTION, BILATERAL    . CESAREAN SECTION     x2   . COLONOSCOPY  2007   neg with Dr. [name]  . ELBOW SURGERY    . HIP SURGERY  2009   left  . TUBAL LIGATION      I have reviewed the social history and family history with the patient and they are unchanged from previous note.  ALLERGIES:  is allergic to zithromax [azithromycin]; zosyn [piperacillin sod-tazobactam so]; and quinolones.  MEDICATIONS:  Current Outpatient Medications  Medication Sig Dispense Refill  . acyclovir (ZOVIRAX) 400 MG tablet Take 1 tablet  (400 mg total) by mouth 2 (two) times daily. 180 tablet 3  . aspirin EC 81 MG tablet Take 1 tablet (81 mg total) by mouth daily. 90 tablet 3  . atorvastatin (LIPITOR) 20 MG tablet Take 1 tablet (20 mg total) by mouth daily. 10 tablet 0  . cyclophosphamide (CYTOXAN) 50 MG capsule Take 10 capsules (500 mg total) by mouth once a week. Take 300 mg/m2 by mouth with breakfast once weekly. Take with food to minimize GI upset. Take early in the day and maintain hydration. 40 capsule 3  . dexamethasone (DECADRON) 4 MG tablet Take 5 tablets (20 mg total) by mouth once a week. Take on the day of chemo 20 tablet 3  . metoprolol succinate (TOPROL-XL) 25 MG 24 hr tablet Take 1 tablet (25 mg total) by mouth daily. 15 tablet 0  . ondansetron (ZOFRAN) 8 MG tablet Take 1 tablet (8 mg total) by mouth every 8 (eight) hours as needed for nausea or vomiting. 20 tablet 3  . prochlorperazine (COMPAZINE) 10 MG tablet Take 1 tablet (10 mg total)  by mouth every 6 (six) hours as needed (Nausea or vomiting). 30 tablet 1  . acetaminophen (TYLENOL) 500 MG tablet Take 500 mg by mouth every 6 (six) hours as needed for moderate pain or fever.    . Calcium Carbonate-Vit D-Min (CALCIUM 600+D PLUS MINERALS) 600-400 MG-UNIT TABS Take by mouth.    . doxycycline (VIBRA-TABS) 100 MG tablet Take 100 mg by mouth as needed.     No current facility-administered medications for this visit.   Facility-Administered Medications Ordered in Other Visits  Medication Dose Route Frequency Provider Last Rate Last Admin  . 0.9 %  sodium chloride infusion   Intravenous Once [name], MD        PHYSICAL EXAMINATION: ECOG PERFORMANCE STATUS: 1 - Symptomatic but completely ambulatory  Vitals:   06/28/19 1329  BP: 126/68  Pulse: 85  Resp: 18  Temp: 98 F (36.7 C)  SpO2: 97%   Filed Weights   06/28/19 1329  Weight: 119 lb 9.6 oz (54.3 kg)    GENERAL:alert, no distress and comfortable SKIN: no rash  EYES:  sclera clear HEENT: palpable  right occipital lesion, tender to palpation  LUNGS: clear with normal breathing effort HEART: regular rate & rhythm, trace ankle edema  ABDOMEN:abdomen soft, non-tender and normal bowel sounds Musculoskeletal:  NEURO: alert & oriented x 3 with fluent speech, no focal motor/sensory deficits  LABORATORY DATA:  I have reviewed the data as listed CBC Latest Ref Rng & Units 06/28/2019 06/21/2019 06/14/2019  WBC 4.0 - 10.5 K/uL 3.9(L) 2.9(L) 2.9(L)  Hemoglobin 12.0 - 15.0 g/dL 9.3(L) 8.9(L) 8.9(L)  Hematocrit 36.0 - 46.0 % 27.5(L) 26.6(L) 26.5(L)  Platelets 150 - 400 K/uL 94(L) 134(L) 125(L)     CMP Latest Ref Rng & Units 06/28/2019 06/21/2019 06/14/2019  Glucose 70 - 99 mg/dL 150(H) 91 100(H)  BUN 8 - 23 mg/dL 17 17 16   Creatinine 0.44 - 1.00 mg/dL 0.76 0.77 0.73  Sodium 135 - 145 mmol/L 133(L) 138 135  Potassium 3.5 - 5.1 mmol/L 4.2 4.0 4.2  Chloride 98 - 111 mmol/L 104 105 103  CO2 22 - 32 mmol/L 20(L) 25 23  Calcium 8.9 - 10.3 mg/dL 8.5(L) 8.7(L) 9.1  Total Protein 6.5 - 8.1 g/dL 6.6 6.6 6.6  Total Bilirubin 0.3 - 1.2 mg/dL 0.7 0.5 0.5  Alkaline Phos 38 - 126 U/L 93 83 78  AST 15 - 41 U/L 19 16 17   ALT 0 - 44 U/L 22 21 32      RADIOGRAPHIC STUDIES: I have personally reviewed the radiological images as listed and agreed with the findings in the report. No results found.   ASSESSMENT & PLAN: Shannon Rush a 65B.o.femalewith    1. IgG kappa Multiple myeloma s/p Auto SCT, intermediate risk, relapsed in 01/2016 -Diagnosed in 05/2012. Treated with induction chemo, radiation and BM transplant, but unfortunately progressed while on maintenance Revlimid 01/2016. She is currently onDaratumumabmonotherapy -Dexa was stoppeddue to her high risk of fracture, and well controlled MM -During COVID19 treatment changed toevery 4 weeks, and changed back to every 3 weeks in early June -Due toher slight disease progressionandsymptomatic bone lesion in left petrous apex, Dr. Burr Medico  changed Dara back to q2 weeks. -Due to increase in M protein from 0.3 to 0.6, she underwent restaging bone marrow biopsy in 03/2019 which shows normocellular marrow and minimal involvement by plasma cell neoplasm (2%) -She also underwent PET scan in 03/2019 which shows scattered lytic lesions, some of which show hypermetabolic activity.  -M  spike up to 1.1 on 06/07/19 and she developed worsening cytopenias, consistent with progression -per the recommendation of Dr. Samule Ohm at [facility] and Dr. Burr Medico, she started CyBorD on 05/24/19. She developed worsening neuropathy after first velcade inj and it was changed to kyprolis after second dose  -Ms. Perdew appears stable today. She completed cycle 1 day 8 KCD. She continues to tolerate treatment well. -Labs are stable, plt 94K. Overall adequate to proceed with cycle 1 day 15 KCD, off next week. -she will return in 2 weeks with cycle 2  -palliative RT to occipital lesion and left hip are pending. CT sim with Dr. Tammi Klippel on 12/15   2. Left petrous apex bone lesion and right occipital lesion  -Her inner ear issues and dizziness progressed and she developedmildbalance issueand nausea  -Her MRI brain from 01/07/19 showedabnormal osseous lesion measuring up to approximately 5.9 cm centered at the left petrous apex. Thismost likely is the cause of her underlyingleft ear and balancesymptoms. -S/p target RT with Dr Tammi Klippel 7/1-7/15. -She has stable muffled hearing and mild imbalanceissue, no significant improvement after RT -the right occipital lobe lesion is enlarging and tender on palpation, palliative RT per Dr. Tammi Klippel is pending   3. History of multiple pneumonia andbronchitis -She has had multiple episodes of pneumoniaand bronchitis, required hospitalizationandantibiotics -Her vaccines are up-to-date.  4. Pancytopenia -Secondary to Daraand recently related to disease progression -s/p RBC for symptomatic anemia 7.7 on 11/20 -Hgb improved  after RBC, stable -ANC 3.5 today -PLT 94K, she had minor nosebleed last week, nothing major -continue monitoring  5. CHF, CAD, inferior STEMI in 02/2014, HLD -She is on ASA, BB, statin -f/u with Dr. Meda Coffee and continue medications -normal EF and improved strain on echo from 04/27/19, per cardiology PA ok to do echo q6 months for now -will monitor cardiac enzymes on kyprolis, CK/CKMB pending   6. Osteoporosis with multiple fractures -Last DEXA in 2015 showed worsening osteoporosis. She declined repeat DEXA. -She had a fall induced fracture in 01/2018. -She was on Zometa for 4 years, stopped after April 2018 -She has left hip pain that worsened abruptly, worsened by walking and using stairs.  -Xray on 11/30 is negative for new fracture, hardware is intact. This is likely related to MM, she reportedly spoke to ortho who did not feel this was an ortho issue.  -Restarted Zometa on 06/21/19, will dose adjust for CrCl   -her left hip pain has improved over the last week, she is not in wheelchair today, mobility has improved  -scheduled for CT sim for palliative RT per Dr. Tammi Klippel on 12/15  7. Peripheral neuropathy in feet, G1-2 -Secondary to previous chemo. -worse in feet than hands -recent tuning fork exam shows decreased vibratory sense up to her ankle.  -previouslydeclined gabapentin -Due to worsening neuropathy after restarting Velcade, regimen was changed to kyprolis after 2 doses  -CIPN worse in feet, overall functions well -stable on kyprolis   8. Goal of care discussion  -full code -treatment goal is palliative  PLAN: -Labs reviewed -Proceed with cycle 1 day 15 KCD, off next week -Return for f/u and cycle 2 in 2 weeks -Pending palliative RT, per Dr. Tammi Klippel, will likely hold chemo during RT  All questions were answered. The patient knows to call the clinic with any problems, questions or concerns. No barriers to learning was detected. I spent 20 minutes} counseling  the patient face to face. The total time spent in the appointment was 25 minutes and more than 50%  was on counseling and review of test results     Alla Feeling, NP 06/28/19

## 2019-06-28 ENCOUNTER — Inpatient Hospital Stay: Payer: Medicare Other

## 2019-06-28 ENCOUNTER — Other Ambulatory Visit: Payer: Self-pay

## 2019-06-28 ENCOUNTER — Encounter: Payer: Self-pay | Admitting: Nurse Practitioner

## 2019-06-28 ENCOUNTER — Inpatient Hospital Stay (HOSPITAL_BASED_OUTPATIENT_CLINIC_OR_DEPARTMENT_OTHER): Payer: Medicare Other | Admitting: Nurse Practitioner

## 2019-06-28 ENCOUNTER — Telehealth: Payer: Self-pay | Admitting: Radiation Oncology

## 2019-06-28 VITALS — BP 126/68 | HR 85 | Temp 98.0°F | Resp 18 | Ht 67.0 in | Wt 119.6 lb

## 2019-06-28 DIAGNOSIS — Z79899 Other long term (current) drug therapy: Secondary | ICD-10-CM | POA: Diagnosis not present

## 2019-06-28 DIAGNOSIS — Z7982 Long term (current) use of aspirin: Secondary | ICD-10-CM | POA: Diagnosis not present

## 2019-06-28 DIAGNOSIS — C9002 Multiple myeloma in relapse: Secondary | ICD-10-CM

## 2019-06-28 DIAGNOSIS — C9000 Multiple myeloma not having achieved remission: Secondary | ICD-10-CM | POA: Diagnosis not present

## 2019-06-28 DIAGNOSIS — Z5112 Encounter for antineoplastic immunotherapy: Secondary | ICD-10-CM | POA: Diagnosis not present

## 2019-06-28 DIAGNOSIS — D61818 Other pancytopenia: Secondary | ICD-10-CM | POA: Diagnosis not present

## 2019-06-28 DIAGNOSIS — Z9221 Personal history of antineoplastic chemotherapy: Secondary | ICD-10-CM | POA: Diagnosis not present

## 2019-06-28 DIAGNOSIS — G629 Polyneuropathy, unspecified: Secondary | ICD-10-CM | POA: Diagnosis not present

## 2019-06-28 DIAGNOSIS — Z9481 Bone marrow transplant status: Secondary | ICD-10-CM | POA: Diagnosis not present

## 2019-06-28 DIAGNOSIS — Z923 Personal history of irradiation: Secondary | ICD-10-CM | POA: Diagnosis not present

## 2019-06-28 LAB — CK TOTAL AND CKMB (NOT AT ARMC)
CK, MB: 2.6 ng/mL (ref 0.5–5.0)
Relative Index: INVALID (ref 0.0–2.5)
Total CK: 27 U/L — ABNORMAL LOW (ref 38–234)

## 2019-06-28 LAB — CMP (CANCER CENTER ONLY)
ALT: 22 U/L (ref 0–44)
AST: 19 U/L (ref 15–41)
Albumin: 3.2 g/dL — ABNORMAL LOW (ref 3.5–5.0)
Alkaline Phosphatase: 93 U/L (ref 38–126)
Anion gap: 9 (ref 5–15)
BUN: 17 mg/dL (ref 8–23)
CO2: 20 mmol/L — ABNORMAL LOW (ref 22–32)
Calcium: 8.5 mg/dL — ABNORMAL LOW (ref 8.9–10.3)
Chloride: 104 mmol/L (ref 98–111)
Creatinine: 0.76 mg/dL (ref 0.44–1.00)
GFR, Est AFR Am: >60 mL/min (ref >60)
GFR, Estimated: >60 mL/min (ref >60)
Glucose, Bld: 150 mg/dL — ABNORMAL HIGH (ref 70–99)
Potassium: 4.2 mmol/L (ref 3.5–5.1)
Sodium: 133 mmol/L — ABNORMAL LOW (ref 135–145)
Total Bilirubin: 0.7 mg/dL (ref 0.3–1.2)
Total Protein: 6.6 g/dL (ref 6.5–8.1)

## 2019-06-28 LAB — CBC WITH DIFFERENTIAL (CANCER CENTER ONLY)
Abs Immature Granulocytes: 0.13 10*3/uL — ABNORMAL HIGH (ref 0.00–0.07)
Basophils Absolute: 0 10*3/uL (ref 0.0–0.1)
Basophils Relative: 1 %
Eosinophils Absolute: 0 10*3/uL (ref 0.0–0.5)
Eosinophils Relative: 1 %
HCT: 27.5 % — ABNORMAL LOW (ref 36.0–46.0)
Hemoglobin: 9.3 g/dL — ABNORMAL LOW (ref 12.0–15.0)
Immature Granulocytes: 3 %
Lymphocytes Relative: 1 %
Lymphs Abs: 0.1 10*3/uL — ABNORMAL LOW (ref 0.7–4.0)
MCH: 34.6 pg — ABNORMAL HIGH (ref 26.0–34.0)
MCHC: 33.8 g/dL (ref 30.0–36.0)
MCV: 102.2 fL — ABNORMAL HIGH (ref 80.0–100.0)
Monocytes Absolute: 0.1 10*3/uL (ref 0.1–1.0)
Monocytes Relative: 3 %
Neutro Abs: 3.5 10*3/uL (ref 1.7–7.7)
Neutrophils Relative %: 91 %
Platelet Count: 94 10*3/uL — ABNORMAL LOW (ref 150–400)
RBC: 2.69 MIL/uL — ABNORMAL LOW (ref 3.87–5.11)
RDW: 17.5 % — ABNORMAL HIGH (ref 11.5–15.5)
WBC Count: 3.9 10*3/uL — ABNORMAL LOW (ref 4.0–10.5)
nRBC: 0 % (ref 0.0–0.2)

## 2019-06-28 MED ORDER — SODIUM CHLORIDE 0.9 % IV SOLN
Freq: Once | INTRAVENOUS | Status: DC
  Filled 2019-06-28: qty 250

## 2019-06-28 MED ORDER — DEXAMETHASONE 4 MG PO TABS
ORAL_TABLET | ORAL | Status: AC
  Filled 2019-06-28: qty 5

## 2019-06-28 MED ORDER — DEXTROSE 5 % IV SOLN
69.0000 mg/m2 | Freq: Once | INTRAVENOUS | Status: AC
  Administered 2019-06-28: 110 mg via INTRAVENOUS
  Filled 2019-06-28: qty 30

## 2019-06-28 MED ORDER — DEXAMETHASONE 4 MG PO TABS
20.0000 mg | ORAL_TABLET | Freq: Once | ORAL | Status: AC
  Administered 2019-06-28: 20 mg via ORAL

## 2019-06-28 MED ORDER — SODIUM CHLORIDE 0.9 % IV SOLN
Freq: Once | INTRAVENOUS | Status: AC
  Administered 2019-06-28: 15:00:00 via INTRAVENOUS
  Filled 2019-06-28: qty 250

## 2019-06-28 MED ORDER — PALONOSETRON HCL INJECTION 0.25 MG/5ML
INTRAVENOUS | Status: AC
  Filled 2019-06-28: qty 5

## 2019-06-28 MED ORDER — PALONOSETRON HCL INJECTION 0.25 MG/5ML
0.2500 mg | Freq: Once | INTRAVENOUS | Status: AC
  Administered 2019-06-28: 0.25 mg via INTRAVENOUS

## 2019-06-28 NOTE — Patient Instructions (Signed)
[facility] Discharge Instructions for Patients Receiving Chemotherapy  Today you received the following chemotherapy agents:  Kyprolis  To help prevent nausea and vomiting after your treatment, we encourage you to take your nausea medication as directed.   If you develop nausea and vomiting that is not controlled by your nausea medication, call the clinic.   BELOW ARE SYMPTOMS THAT SHOULD BE REPORTED IMMEDIATELY:  *FEVER GREATER THAN 100.5 F  *CHILLS WITH OR WITHOUT FEVER  NAUSEA AND VOMITING THAT IS NOT CONTROLLED WITH YOUR NAUSEA MEDICATION  *UNUSUAL SHORTNESS OF BREATH  *UNUSUAL BRUISING OR BLEEDING  TENDERNESS IN MOUTH AND THROAT WITH OR WITHOUT PRESENCE OF ULCERS  *URINARY PROBLEMS  *BOWEL PROBLEMS  UNUSUAL RASH Items with * indicate a potential emergency and should be followed up as soon as possible.  Feel free to call the clinic should you have any questions or concerns. The clinic phone number is [phone].  Please show the CHEMO ALERT CARD at check-in to the Emergency Department and triage nurse.   

## 2019-06-28 NOTE — Progress Notes (Signed)
Platelets today are 43, ok to treat per Dr. Burr Medico.

## 2019-06-28 NOTE — Telephone Encounter (Signed)
Phoned patient and confirmed simulation appointment for tomorrow at 3 pm.

## 2019-06-29 ENCOUNTER — Telehealth: Payer: Self-pay | Admitting: Hematology

## 2019-06-29 ENCOUNTER — Encounter: Payer: Self-pay | Admitting: Hematology

## 2019-06-29 ENCOUNTER — Other Ambulatory Visit: Payer: Self-pay

## 2019-06-29 ENCOUNTER — Encounter: Payer: Self-pay | Admitting: Family Medicine

## 2019-06-29 ENCOUNTER — Ambulatory Visit
Admission: RE | Admit: 2019-06-29 | Discharge: 2019-06-29 | Disposition: A | Discharge status: Home / Self Care | Payer: Medicare Other | Source: Ambulatory Visit | Attending: Radiation Oncology | Admitting: Radiation Oncology

## 2019-06-29 ENCOUNTER — Telehealth: Payer: Self-pay | Admitting: Nurse Practitioner

## 2019-06-29 DIAGNOSIS — Z510 Encounter for antineoplastic radiation therapy: Secondary | ICD-10-CM | POA: Insufficient documentation

## 2019-06-29 DIAGNOSIS — C7951 Secondary malignant neoplasm of bone: Secondary | ICD-10-CM | POA: Diagnosis not present

## 2019-06-29 DIAGNOSIS — C9000 Multiple myeloma not having achieved remission: Secondary | ICD-10-CM | POA: Diagnosis not present

## 2019-06-29 NOTE — Telephone Encounter (Signed)
No los per 12/14 

## 2019-06-29 NOTE — Telephone Encounter (Signed)
Called pt per 12/15 sch message - unable to reach pt. Left message for patient to call back for reschedule.

## 2019-06-30 ENCOUNTER — Telehealth: Payer: Self-pay | Admitting: Radiation Oncology

## 2019-06-30 NOTE — Telephone Encounter (Signed)
Received voicemail message from patient requesting a return call. Phoned patient back. Patient questions if it would be possible to start radiation therapy sooner than 12/29 because of the pain she is experiencing. Explained this RN would send her request to Dr. Tammi Klippel and phone back with his response. Patient verbalized understanding and expressed appreciation for the call back.

## 2019-06-30 NOTE — Addendum Note (Signed)
Encounter addended by: Tyler Pita, MD on: 06/30/2019 5:56 PM  Actions taken: Medication List reviewed, Problem List reviewed, Allergies reviewed, Clinical Note Signed

## 2019-06-30 NOTE — Progress Notes (Signed)
  Radiation Oncology         [phone] ________________________________  Name: Shannon Rush MRN: [id number]  Date: 06/29/2019  DOB: Aug 01, 1940  SIMULATION AND TREATMENT PLANNING NOTE    ICD-10-CM   1. Multiple myeloma not having achieved remission ([place])  C90.00     DIAGNOSIS:  78 yo woman with painful right occipital and left hip involvement by multiple myeloma  NARRATIVE:  The patient was brought to the [place].  Identity was confirmed.  All relevant records and images related to the planned course of therapy were reviewed.  The patient freely provided informed written consent to proceed with treatment after reviewing the details related to the planned course of therapy. The consent form was witnessed and verified by the simulation staff.  Then, the patient was set-up in a stable reproducible  supine position for radiation therapy.  CT images were obtained.  Surface markings were placed.  The CT images were loaded into the planning software.  Then the target and avoidance structures were contoured.  Treatment planning then occurred.  The radiation prescription was entered and confirmed.  Then, I designed and supervised the construction of a total of at least 5 medically necessary complex treatment devices including aquaplast head mask, and MLCs to shield brain and critical pelvic organs.  I have requested : 3D Simulation  I have requested a DVH of the following structures: Spinal Cord, brain and target.   PLAN:  The patient will receive 20 Gy in 10 fraction.  ________________________________  Sheral Apley Tammi Klippel, M.D.

## 2019-07-01 DIAGNOSIS — C7951 Secondary malignant neoplasm of bone: Secondary | ICD-10-CM | POA: Diagnosis not present

## 2019-07-01 DIAGNOSIS — C9000 Multiple myeloma not having achieved remission: Secondary | ICD-10-CM | POA: Diagnosis not present

## 2019-07-01 DIAGNOSIS — Z510 Encounter for antineoplastic radiation therapy: Secondary | ICD-10-CM | POA: Diagnosis not present

## 2019-07-05 ENCOUNTER — Ambulatory Visit
Admission: RE | Admit: 2019-07-05 | Discharge: 2019-07-05 | Disposition: A | Discharge status: Home / Self Care | Payer: Medicare Other | Source: Ambulatory Visit | Attending: Radiation Oncology | Admitting: Radiation Oncology

## 2019-07-05 ENCOUNTER — Ambulatory Visit: Payer: Medicare Other

## 2019-07-05 ENCOUNTER — Other Ambulatory Visit: Payer: Self-pay

## 2019-07-05 DIAGNOSIS — Z510 Encounter for antineoplastic radiation therapy: Secondary | ICD-10-CM | POA: Diagnosis not present

## 2019-07-05 DIAGNOSIS — C9000 Multiple myeloma not having achieved remission: Secondary | ICD-10-CM | POA: Diagnosis not present

## 2019-07-05 DIAGNOSIS — C7951 Secondary malignant neoplasm of bone: Secondary | ICD-10-CM | POA: Diagnosis not present

## 2019-07-06 ENCOUNTER — Telehealth: Payer: Self-pay | Admitting: Nurse Practitioner

## 2019-07-06 ENCOUNTER — Other Ambulatory Visit: Payer: Self-pay

## 2019-07-06 ENCOUNTER — Ambulatory Visit: Payer: Medicare Other

## 2019-07-06 ENCOUNTER — Ambulatory Visit
Admission: RE | Admit: 2019-07-06 | Discharge: 2019-07-06 | Disposition: A | Discharge status: Home / Self Care | Payer: Medicare Other | Source: Ambulatory Visit | Attending: Radiation Oncology | Admitting: Radiation Oncology

## 2019-07-06 DIAGNOSIS — C9000 Multiple myeloma not having achieved remission: Secondary | ICD-10-CM | POA: Diagnosis not present

## 2019-07-06 DIAGNOSIS — Z510 Encounter for antineoplastic radiation therapy: Secondary | ICD-10-CM | POA: Diagnosis not present

## 2019-07-06 NOTE — Telephone Encounter (Signed)
Returned patient's phone call regarding rescheduling an appointment, per patient's request 01/11 appointment has moved to 01/12.

## 2019-07-07 ENCOUNTER — Ambulatory Visit: Payer: Medicare Other

## 2019-07-07 ENCOUNTER — Other Ambulatory Visit: Payer: Self-pay

## 2019-07-07 ENCOUNTER — Ambulatory Visit
Admission: RE | Admit: 2019-07-07 | Discharge: 2019-07-07 | Disposition: A | Discharge status: Home / Self Care | Payer: Medicare Other | Source: Ambulatory Visit | Attending: Radiation Oncology | Admitting: Radiation Oncology

## 2019-07-07 DIAGNOSIS — C9000 Multiple myeloma not having achieved remission: Secondary | ICD-10-CM | POA: Diagnosis not present

## 2019-07-07 DIAGNOSIS — Z510 Encounter for antineoplastic radiation therapy: Secondary | ICD-10-CM | POA: Diagnosis not present

## 2019-07-08 ENCOUNTER — Ambulatory Visit
Admission: RE | Admit: 2019-07-08 | Discharge: 2019-07-08 | Disposition: A | Discharge status: Home / Self Care | Payer: Medicare Other | Source: Ambulatory Visit | Attending: Radiation Oncology | Admitting: Radiation Oncology

## 2019-07-08 ENCOUNTER — Other Ambulatory Visit: Payer: Self-pay

## 2019-07-08 ENCOUNTER — Ambulatory Visit: Payer: Medicare Other

## 2019-07-08 DIAGNOSIS — C9000 Multiple myeloma not having achieved remission: Secondary | ICD-10-CM | POA: Diagnosis not present

## 2019-07-08 DIAGNOSIS — Z510 Encounter for antineoplastic radiation therapy: Secondary | ICD-10-CM | POA: Diagnosis not present

## 2019-07-12 ENCOUNTER — Inpatient Hospital Stay: Payer: Medicare Other

## 2019-07-12 ENCOUNTER — Ambulatory Visit
Admission: RE | Admit: 2019-07-12 | Discharge: 2019-07-12 | Disposition: A | Discharge status: Home / Self Care | Payer: Medicare Other | Source: Ambulatory Visit | Attending: Radiation Oncology | Admitting: Radiation Oncology

## 2019-07-12 ENCOUNTER — Other Ambulatory Visit: Payer: Self-pay

## 2019-07-12 ENCOUNTER — Inpatient Hospital Stay (HOSPITAL_BASED_OUTPATIENT_CLINIC_OR_DEPARTMENT_OTHER): Payer: Medicare Other | Admitting: Nurse Practitioner

## 2019-07-12 ENCOUNTER — Ambulatory Visit: Payer: Medicare Other

## 2019-07-12 VITALS — BP 114/70 | HR 83 | Temp 97.6°F | Resp 16 | Ht 67.0 in | Wt 114.2 lb

## 2019-07-12 DIAGNOSIS — Z7982 Long term (current) use of aspirin: Secondary | ICD-10-CM | POA: Diagnosis not present

## 2019-07-12 DIAGNOSIS — Z79899 Other long term (current) drug therapy: Secondary | ICD-10-CM | POA: Diagnosis not present

## 2019-07-12 DIAGNOSIS — G629 Polyneuropathy, unspecified: Secondary | ICD-10-CM | POA: Diagnosis not present

## 2019-07-12 DIAGNOSIS — C9000 Multiple myeloma not having achieved remission: Secondary | ICD-10-CM | POA: Diagnosis not present

## 2019-07-12 DIAGNOSIS — C7951 Secondary malignant neoplasm of bone: Secondary | ICD-10-CM | POA: Diagnosis not present

## 2019-07-12 DIAGNOSIS — C9002 Multiple myeloma in relapse: Secondary | ICD-10-CM

## 2019-07-12 DIAGNOSIS — D61818 Other pancytopenia: Secondary | ICD-10-CM | POA: Diagnosis not present

## 2019-07-12 DIAGNOSIS — Z923 Personal history of irradiation: Secondary | ICD-10-CM | POA: Diagnosis not present

## 2019-07-12 DIAGNOSIS — Z5112 Encounter for antineoplastic immunotherapy: Secondary | ICD-10-CM | POA: Diagnosis not present

## 2019-07-12 DIAGNOSIS — Z9481 Bone marrow transplant status: Secondary | ICD-10-CM | POA: Diagnosis not present

## 2019-07-12 DIAGNOSIS — Z9221 Personal history of antineoplastic chemotherapy: Secondary | ICD-10-CM | POA: Diagnosis not present

## 2019-07-12 DIAGNOSIS — Z510 Encounter for antineoplastic radiation therapy: Secondary | ICD-10-CM | POA: Diagnosis not present

## 2019-07-12 LAB — CBC WITH DIFFERENTIAL (CANCER CENTER ONLY)
Abs Immature Granulocytes: 0.04 10*3/uL (ref 0.00–0.07)
Basophils Absolute: 0 10*3/uL (ref 0.0–0.1)
Basophils Relative: 1 %
Eosinophils Absolute: 0 10*3/uL (ref 0.0–0.5)
Eosinophils Relative: 1 %
HCT: 29.8 % — ABNORMAL LOW (ref 36.0–46.0)
Hemoglobin: 9.9 g/dL — ABNORMAL LOW (ref 12.0–15.0)
Immature Granulocytes: 1 %
Lymphocytes Relative: 2 %
Lymphs Abs: 0.1 10*3/uL — ABNORMAL LOW (ref 0.7–4.0)
MCH: 34.9 pg — ABNORMAL HIGH (ref 26.0–34.0)
MCHC: 33.2 g/dL (ref 30.0–36.0)
MCV: 104.9 fL — ABNORMAL HIGH (ref 80.0–100.0)
Monocytes Absolute: 0.2 10*3/uL (ref 0.1–1.0)
Monocytes Relative: 6 %
Neutro Abs: 3.3 10*3/uL (ref 1.7–7.7)
Neutrophils Relative %: 89 %
Platelet Count: 206 10*3/uL (ref 150–400)
RBC: 2.84 MIL/uL — ABNORMAL LOW (ref 3.87–5.11)
RDW: 18.9 % — ABNORMAL HIGH (ref 11.5–15.5)
WBC Count: 3.7 10*3/uL — ABNORMAL LOW (ref 4.0–10.5)
nRBC: 0 % (ref 0.0–0.2)

## 2019-07-12 LAB — CMP (CANCER CENTER ONLY)
ALT: 16 U/L (ref 0–44)
AST: 16 U/L (ref 15–41)
Albumin: 3.3 g/dL — ABNORMAL LOW (ref 3.5–5.0)
Alkaline Phosphatase: 121 U/L (ref 38–126)
Anion gap: 10 (ref 5–15)
BUN: 15 mg/dL (ref 8–23)
CO2: 23 mmol/L (ref 22–32)
Calcium: 8.6 mg/dL — ABNORMAL LOW (ref 8.9–10.3)
Chloride: 104 mmol/L (ref 98–111)
Creatinine: 0.74 mg/dL (ref 0.44–1.00)
GFR, Est AFR Am: >60 mL/min (ref >60)
GFR, Estimated: >60 mL/min (ref >60)
Glucose, Bld: 100 mg/dL — ABNORMAL HIGH (ref 70–99)
Potassium: 4.1 mmol/L (ref 3.5–5.1)
Sodium: 137 mmol/L (ref 135–145)
Total Bilirubin: 0.6 mg/dL (ref 0.3–1.2)
Total Protein: 6.5 g/dL (ref 6.5–8.1)

## 2019-07-12 NOTE — Progress Notes (Signed)
[facility]   [phone] [phone]   Clinic Follow up Note   Patient Care Team: [name], MD as PCP - General (Family Medicine) [name], MD as PCP - Cardiology (Cardiology) [name], MD as Attending Physician (Obstetrics and Gynecology) [name], MD as Consulting Physician (Internal Medicine) [name], MD as Consulting Physician (Hematology) [name], MD as Consulting Physician (Ophthalmology) [name], MD as Consulting Physician (Cardiology) [name], MD as Consulting Physician (Dermatology) [name] (Dentistry) [name], DPM as Consulting Physician (Podiatry) Pleasant, Eppie Gibson, RN as [place] Management  Date of Service: 07/12/2019   CHIEF COMPLAINT: F/u MM  SUMMARY OF ONCOLOGIC HISTORY: Oncology History Overview Note  Multiple myeloma [facility])   Staging form: Multiple Myeloma, AJCC 6th Edition   - Clinical: Stage IIIA - Signed by [name], MD on 09/04/2013    Multiple myeloma ([place])  05/26/2012 Initial Diagnosis   Presenting IgG was 4,040 mg/dL on 06/05/2012 (IgA 35; IgM 34); kappa free light chain 34.4 mg/dL, lambda 0.00, kappa:lambda ratio of 34.75; SPEP with M-spike of 2.60.   06/30/2012 Imaging   Numerous lytic lesions throughout the calvarium.  Lytic lesion within the left lateral scapula.  Findings most compatible with metastases or myeloma. Multiple mid and lower thoracic compression fractures.  Slight compression through the endplates at L3.   46/96/2952 Bone Marrow Biopsy   Bone marrow biopsy showed 49% plasma cell. Normal classical cytogenetics; however, myeloma FISH panel showed 13q- (intermediate risk)       07/14/2012 - 07/27/2012 Radiation Therapy   Palliative radiation 20 Gy over 10 fractions between to thoracic spine cord compression and symptomatic left scapula lytic lesion.   08/10/2012 - 11/2012 Chemotherapy   Started SQ Velcade once weekly, 3 weeks on, 1 weeks off; daily Revlimid d1-21, 7 days off; and Dexamethasone '40mg'$  PO weekly.    02/12/2013 Bone Marrow Transplant   Auto bone marrow transplant at [facility]     06/14/2013 Tumor Marker   IgG  1830,  Kappa:lamba ratio, 1.69 (baseline was 34.75 or   M-spike 0.28 (baseline of 2.6 or  89.3% of baseline).     06/25/2013 -  Chemotherapy   Started zometa 3.5 mg monthly    09/01/2013 - 01/2014 Chemotherapy   Maintenance therapy with revlimid '5mg'$  daily for 14 days and then off for 7 (decreased from 21 days on and 7 days off based on neutropenia). Stopped due to disease progression 01/2017   09/13/2013 - 09/17/2014 Hospital Admission   Hospital admission for pneumonia   12/20/2013 Treatment Plan Change   Maintenance therapy decreased to 2.5 mg daily for 21 days and then off for 7 days based on low counts.    01/25/2014 Tumor Marker   IgG 1360 M spike 0.5   03/01/2014 - 03/05/2014 Hospital Admission   [facility] center with an inferior STEMI, received thrombolytic therapy, cath showed 60% prox RCA, mid-distal RCA 50%, Echo normal, no stents.   04/11/2014 - 08/07/2014 Chemotherapy   Continue Revlimid at 2.5 mg 3 weeks on 1 week off   08/08/2014 - 08/13/2014 Hospital Admission   Hospital admission for pneumonia. Her Revlimid was held.   08/29/2014 - 09/14/2014 Chemotherapy   Maintenance Revlimid restarted   09/14/2014 - 09/17/2014 Hospital Admission   Admitted for pneumonia after coming back from a trip in [name]. Revlimid was held again.   11/13/2014 - 12/25/2015 Chemotherapy   Maintenance Revlimid restarted, changed to 2.'5mg'$   daily, 2 weks on, 1 week off from 12/15/2014, stopped due to disease progression    01/24/2016 Progression   Patient is M protein has gradually increased to 1.0g, repeated a bone marrow biopsy showed plasma cell 10-30%   02/27/2016 - 03/07/2016 Chemotherapy   Pomalidomide 4 mg daily on day 1-21, dexamethasone 40 mg on day 1,  8 and 15, every 28 days, started on 02/27/2016, daratumumab weekly started on 03/07/2016, held after first dose, due to hospitalization and a severe pancytopenia, pomalidomide was stopped afterwards    03/10/2016 - 03/29/2016 Hospital Admission   Pt was admitted for sepsis from pneumonia, and severe pancytopenia. She required ICU stay for a few days due to hypotension, developed b/l pleural effusion required diuretics and b/l thoracentesis. She also required blood and plt transfusion, and prolonged neupogen injection for severe neutropenia. She was discharged home after 3 weeks hospital stay    04/26/2016 - 04/26/2019 Chemotherapy   Daratumumab restarted on 04/26/2016, Velcade 1.66m/m2 and dexa 24mweekly start on 11/3, velcade held since 06/28/2016 due to CHF, dexa stopped on12/11/2017 due to high risk of fracture.  Dara reduced to every 4 weeks due to COVID-19 starting 10/23/18. Returned to every 3 weeks starting 12/17/18. Restarted every 2 weeks starting 02/22/19 Stopped on 04/26/19 due to disease progression.    08/26/2016 Echocardiogram   - Left ventricle: LVEF is approximately 35% with diffuse diffuse   hypokinesis with severe hypokinesis/ akinesis of the   inferior/inferoseptal walls. In comparison to echo images from   December 2017, there does not appear to be significant change The   cavity size was normal. Wall thickness was normal. Doppler   parameters are consistent with abnormal left ventricular   relaxation (grade 1 diastolic dysfunction). - Aortic valve: AV is thickened. 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 Present in echo from December 2017 There was   trivial regurgitation. - Right ventricle: Systolic function was mildly reduced. - Right atrium: The atrium was mildly dilated. - Pericardium, extracardiac: A trivial pericardial effusion was   identified.   12/03/2016 Echocardiogram   -Left Ventricle: Systolic  function was   mildly to moderately reduced. The estimated ejection fraction was   in the range of 40% to 45%. Diffuse hypokinesis. Doppler   parameters are consistent with abnormal left ventricular   relaxation (grade 1 diastolic dysfunction). Doppler parameters   are consistent with indeterminate ventricular filling pressure. - Left atrium: The atrium was severely dilated. - Tricuspid valve: There was trivial regurgitation. - Pulmonary arteries: Systolic pressure was within the normal   range. PA peak pressure: 33 mm Hg (S). - Global longitudinal strain -10.5% (abnormal)   03/12/2017 Echocardiogram   ECHO 03/12/17 Study Conclusions - Left ventricle: The cavity size was normal. Wall thickness was   increased in a pattern of mild LVH. Systolic function was normal.   The estimated ejection fraction was in the range of 50% to 55%.   There is hypokinesis of the basalinferior myocardium. Doppler   parameters are consistent with abnormal left ventricular   relaxation (grade 1 diastolic dysfunction). - Aortic valve: There was trivial regurgitation. - Mitral valve: Calcified annulus. Mildly thickened leaflets .   There was trivial regurgitation. - Tricuspid valve: There was mild regurgitation. Impressions: - There has been mild improvement in EF since prior study.    12/12/2017 - 12/15/2017 Hospital Admission   Admission diagnosis: Pnuemonia and acute hypoxic respiratory failure Additional comments: treated for  pneumoania and acute hypoxic respiratory failure before she was discharged on 12/15/17 with oral anitbiotics. She did not require home oxygen.    12/30/2017 - 01/02/2018 Hospital Admission   Admission diagnosis: Pnuemonia  Additional comments: She was again hospitalized on 12/30/17 for pneumonia, ID work up was otherwise negative, she was discharged home with oral antibiotics.    04/01/2018 Imaging   04/01/2018 CXR IMPRESSION: Persisting airspace disease superimposed on chronic  changes, suggesting pneumonia given the history. Followup PA and lateral chest X-ray is recommended in 3-4 weeks following therapy to ensure resolution and exclude underlying malignancy.  Redemonstration of mid and lower thoracic compression fractures with associated thoracic kyphosis.   04/23/2018 Echocardiogram   04/23/2018 ECHO LV EF: 45% -   50%   01/07/2019 Imaging   MRI Brain 01/07/19 IMPRESSION: MRI HEAD IMPRESSION:   1. 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 Lesion closely approximates the left inner ear structures and involves the left Meckel's cave and left fifth cranial nerve, and most likely is the causative etiology for patient's underlying left ear and facial symptoms. Follow-up examination with postcontrast imaging suggested for complete evaluation. Inclusion of IAC sequences would likely be helpful for visualization. 2. Additional 2.7 cm lesion involving the right occipital calvarium with adjacent marrow signal abnormality, also likely related to multiple myeloma. Metastatic disease would be the primary differential consideration. 3. Underlying age-appropriate cerebral atrophy with mild chronic microvascular ischemic disease. No other acute intracranial abnormality.   MRA HEAD IMPRESSION:   1. Negative intracranial MRA with no large vessel occlusion, hemodynamically significant stenosis, or other acute vascular abnormality. 2. Petrous and cavernous left ICA is partially encased and surrounded by the left petrous apex lesion without significant irregularity or stenosis. 3. 2 mm right paraophthalmic aneurysm.   MRA NECK IMPRESSION:   Normal MRA of the neck with wide patency of the carotid and vertebral arteries bilaterally. No hemodynamically significant stenosis or other acute vascular abnormality identified.      01/14/2019 - 01/27/2019 Radiation Therapy   Radaition to Left petrous apex bone lesion 01/14/19-01/27/19 with Dr. Tammi Klippel.    04/01/2019 Pathology Results   Bone Marrow biopsy 04/01/19  DIAGNOSIS: BONE MARROW, ASPIRATE, CLOT, CORE: - Normocellular marrow with minimal involvement by plasma cell neoplasm - See comment PERIPHERAL BLOOD: - Macrocytic anemia and leukopenia -See complete blood count   04/09/2019 PET scan   PET 04/09/19  IMPRESSION: 1. Scattered lytic lesions of bone. A few of these demonstrate hypermetabolic activity, most notably the right occipital bone lesion; the lytic lesion in the approximately C7 spinous process; the lytic expansile lesion of the left fourth rib anteriorly; and a small lytic focus in the right femoral head. Other scattered lytic lesions have only low-grade activity. 2. Multiple fractures as detailed above. 3. Other imaging findings of potential clinical significance: Aortic Atherosclerosis (ICD10-I70.0). Coronary atherosclerosis.   05/10/2019 -  Chemotherapy   Velcade q2weeeks and Dexa '20mg'$  weekly was added on 05/10/19. Switched to CyBorD (Oral cyclophosphamide CYTOXAN '500mg'$ , IV Velcade 1.'3mg'$ /m2, and oral dexa '20mg'$ , weekly) starting 05/24/19. -changed Velcade to weekly Kyprolis 3 weeks on/1 week off after 2 doses on 06/14/19 due to neuropathy.      CURRENT THERAPY:  -CyBorD (cyclophosphamide(CYTOXAN)'500mg'$  ('300mg'$ /m2),Velcade 1.'3mg'$ /m2, and dexa '20mg'$ , weekly) starting 05/24/19.Due toneuropathyafter 2 doses of Velcade, switched toweeklyKyprolis 3 weeks on/1 week off after 2 doses on 06/14/19 -Restart Zometa injections every 6 months  on 06/21/19.  INTERVAL HISTORY: Shannon Rush returns for f/u and chemo as scheduled. She took zofran, dex, and cytoxan this morning. She began RT to left hip and occipital lesion on 12/21. She feels well today. Left hip pain is reduced. Right occipital tenderness, "ear fullness" are unchanged. She feels stronger and more  confident. She rode her bike today. She has soreness in bilateral hips she thinks is muscular or possibly muscle spasm. Stretching helps. Sensation is constant, not related to activity. This is not new for her. Does not take pain meds. Appetite is stable. Denies n/v. Had some lose stool for 3-4 days. Neuropathy is stable. Denies fever, chills, cough, chest pain, dyspnea, leg edema.    MEDICAL HISTORY:  Past Medical History:  Diagnosis Date  . CAD (coronary artery disease)    a. inf STEMI (in [place] >>> [facility]) >>> LHC (8/15- [place]):  pRCA 60%, mid to dist RCA 50% >>> med rx  . Cardiomyopathy ([place])   . Cataract   . Chronic combined systolic and diastolic CHF (congestive heart failure) ([place])    cancer medication related   . Cord compression ([place]) 07/13/12   MRI- diffuse myeloma involvement of T-L spine  . History of radiation therapy 07/13/12-07/27/12   spinal cord compression T3-T10,left scapula  . 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 Multiple myeloma ([place]) 07/01/2012  . MVP (mitral valve prolapse)    a. trivial by echo 06/2017.  . OSTEOPOROSIS 06/11/2010   Multiple compression fractures; and spontaneous fracture of sternum Qualifier: Diagnosis of  By: [name], [name]     . Thoracic kyphosis 07/13/12   per MRI scan  . Unspecified deficiency anemia     SURGICAL HISTORY: Past Surgical History:  Procedure Laterality Date  . APPENDECTOMY    . CATARACT EXTRACTION, BILATERAL    . CESAREAN SECTION     x2   . COLONOSCOPY  2007   neg with Dr. [name]  . ELBOW SURGERY    . HIP SURGERY  2009   left  . TUBAL LIGATION      I have reviewed the social history and family history with the patient and they are unchanged from previous note.  ALLERGIES:  is allergic to zithromax [azithromycin]; zosyn [piperacillin sod-tazobactam so]; and quinolones.  MEDICATIONS:  Current Outpatient Medications  Medication Sig Dispense Refill  . acetaminophen (TYLENOL) 500 MG tablet  Take 500 mg by mouth every 6 (six) hours as needed for moderate pain or fever.    Marland Kitchen acyclovir (ZOVIRAX) 400 MG tablet Take 1 tablet (400 mg total) by mouth 2 (two) times daily. 180 tablet 3  . aspirin EC 81 MG tablet Take 1 tablet (81 mg total) by mouth daily. 90 tablet 3  . atorvastatin (LIPITOR) 20 MG tablet Take 1 tablet (20 mg total) by mouth daily. 10 tablet 0  . Calcium Carbonate-Vit D-Min (CALCIUM 600+D PLUS MINERALS) 600-400 MG-UNIT TABS Take by mouth.    . cyclophosphamide (CYTOXAN) 50 MG capsule Take 10 capsules (500 mg total) by mouth once a week. Take 300 mg/m2 by mouth with breakfast once weekly. Take with food to minimize GI upset. Take early in the day and maintain hydration. (Patient taking differently: Take 500 mg/m2 by mouth once a week. Take 300 mg/m2 by mouth with breakfast once weekly. Take with food to minimize GI upset. Take early in the day and maintain hydration.) 40 capsule 3  . dexamethasone (DECADRON) 4 MG tablet Take 5 tablets (20 mg  total) by mouth once a week. Take on the day of chemo 20 tablet 3  . metoprolol succinate (TOPROL-XL) 25 MG 24 hr tablet Take 1 tablet (25 mg total) by mouth daily. 15 tablet 0  . ondansetron (ZOFRAN) 8 MG tablet Take 1 tablet (8 mg total) by mouth every 8 (eight) hours as needed for nausea or vomiting. 20 tablet 3  . prochlorperazine (COMPAZINE) 10 MG tablet Take 1 tablet (10 mg total) by mouth every 6 (six) hours as needed (Nausea or vomiting). 30 tablet 1  . doxycycline (VIBRA-TABS) 100 MG tablet Take 100 mg by mouth as needed.     No current facility-administered medications for this visit.    PHYSICAL EXAMINATION: ECOG PERFORMANCE STATUS: 1 - Symptomatic but completely ambulatory  Vitals:   07/12/19 1317  BP: 114/70  Pulse: 83  Resp: 16  Temp: 97.6 F (36.4 C)  SpO2: 96%   Filed Weights   07/12/19 1317  Weight: 114 lb 3.2 oz (51.8 kg)    GENERAL:alert, no distress and comfortable SKIN: no rash  EYES:  sclera  clear LUNGS: clear with normal breathing effort HEART: regular rate & rhythm, no lower extremity edema ABDOMEN:abdomen soft, non-tender and normal bowel sounds Musculoskeletal: no spinal or hip tenderness  NEURO: alert & oriented x 3 with fluent speech, normal gait   LABORATORY DATA:  I have reviewed the data as listed CBC Latest Ref Rng & Units 07/12/2019 06/28/2019 06/21/2019  WBC 4.0 - 10.5 K/uL 3.7(L) 3.9(L) 2.9(L)  Hemoglobin 12.0 - 15.0 g/dL 9.9(L) 9.3(L) 8.9(L)  Hematocrit 36.0 - 46.0 % 29.8(L) 27.5(L) 26.6(L)  Platelets 150 - 400 K/uL 206 94(L) 134(L)     CMP Latest Ref Rng & Units 07/12/2019 06/28/2019 06/21/2019  Glucose 70 - 99 mg/dL 100(H) 150(H) 91  BUN 8 - 23 mg/dL _0 Creatinine 0.44 - 1.00 mg/dL 0.74 0.76 0.77  Sodium 135 - 145 mmol/L 137 133(L) 138  Potassium 3.5 - 5.1 mmol/L 4.1 4.2 4.0  Chloride 98 - 111 mmol/L 104 104 105  CO2 22 - 32 mmol/L 23 20(L) 25  Calcium 8.9 - 10.3 mg/dL 8.6(L) 8.5(L) 8.7(L)  Total Protein 6.5 - 8.1 g/dL 6.5 6.6 6.6  Total Bilirubin 0.3 - 1.2 mg/dL 0.6 0.7 0.5  Alkaline Phos 38 - 126 U/L 121 93 83  AST 15 - 41 U/L _1 ALT 0 - 44 U/L _2 RADIOGRAPHIC STUDIES: I have personally reviewed the radiological images as listed and agreed with the findings in the report. No results found.   ASSESSMENT & PLAN: Shannon Rush a 68M.o.femalewith    1. IgG kappa Multiple myeloma s/p Auto SCT, intermediate risk, relapsed in 01/2016 -Diagnosed in 05/2012. Treated with induction chemo, radiation and BM transplant, but unfortunately progressed while on maintenance Revlimid 01/2016. She is currently onDaratumumabmonotherapy -Dexa was stoppeddue to her high risk of fracture, and well controlled MM -During COVID19 treatment changed toevery 4 weeks, and changed back to every 3 weeks in early June -Due toher slight disease progressionandsymptomatic bone lesion in left petrous apex, Dr. Burr Medico changed Dara back to q2  weeks. -Due to increase in M protein from 0.3 to 0.6, she underwent restaging bone marrow biopsy in 03/2019 which shows normocellular marrow and minimal involvement by plasma cell neoplasm (2%) -She also underwent PET scan in 03/2019 which shows scattered lytic lesions, some of which show hypermetabolic activity.  -M spike up to 1.1 on 06/07/19  and she developed worsening cytopenias, consistent with progression -per the recommendation of Dr. Samule Ohm at [facility] and Dr. Burr Medico, she started CyBorD on 05/24/19. She developed worsening neuropathy after first velcade injand it was changed to kyprolis after second dose -Shannon Rush appears stable today. She completed cycle 1 KCD, tolerated well overall. Her activity level and pain are improved. Has mild lose stool. She developed moderate thrombocytopenia PLT 94 on day 15. Otherwise she is doing well.  -Labs reviewed, thrombocytopenia normalized, CBC and CMP stable. She recovered well.  -She began palliative RT to left hip and right occipital lesion per Dr. Tammi Klippel on 07/05/19, anticipate completing on 07/20/19.  -she took Dex and Cytoxan in anticipation for cycle 2 day 1 chemo today. Due to her being on RT, will hold Kyprolis today.  -Will reschedule her infusion to next week after she completes RT  2. Left petrous apex bone lesionand right occipital lesion -Her inner ear issues and dizziness progressed and she developedmildbalance issueand nausea  -Her MRI brain from 01/07/19 showedabnormal osseous lesion measuring up to approximately 5.9 cm centered at the left petrous apex. Thismost likely is the cause of her underlyingleft ear and balancesymptoms. -S/ptarget RT with Dr Tammi Klippel 7/1-7/15. -She has stable muffled hearing and mild imbalanceissue, no significant improvement after RT -Currently undergoing RT to right occipital lesion per Dr. Tammi Klippel starting 12/21, anticipate completing on 07/20/19  3. History of multiple pneumonia andbronchitis -She  has had multiple episodes of pneumoniaand bronchitis, required hospitalizationandantibiotics -Her vaccines are up-to-date.  4. Pancytopenia -Secondary to Daraand recently related to disease progression -s/p RBC for symptomatic anemia 7.7 on 11/20; Hgb improved  -thrombocytopenia recovered after week off chemo. Anemia is stable today, ANC normal. No need for transfusions currently   5. CHF, CAD, inferior STEMI in 02/2014, HLD -She is on ASA, BB, statin -f/u with Dr. Meda Coffee and continue medications -normal EF and improved strain on echo from 04/27/19, per cardiology PA ok to do echo q6 months for now -CKMB in normal range, will monitor periodically on Kyprolis   6. Osteoporosis with multiple fractures -Last DEXA in 2015 showed worsening osteoporosis. She declined repeat DEXA. -She had a fall induced fracture in 01/2018. -She was on Zometa for 4 years, stopped after April 2018 -She has left hip pain that worsened abruptly, worsened by walking and using stairs.  -Xray on 11/30 is negative for new fracture, hardware is intact. This is likely related to MM, she reportedly spoke to ortho who did not feel this was an ortho issue.  -Restarted Zometa on 06/21/19, dose-adjusted for CrCl -started palliative RT per Dr. Tammi Klippel on 07/05/19, left hip pain is improved  7. Peripheral neuropathy in feet, G1-2 -Secondary to previous chemo, tuning fork exam shows decreased vibratory sense up to her ankle.  -previouslydeclined gabapentin -Worsened on Velcade which was switched to Kyprolis after 2 doses -Stable, able to function well  8. Goal of care discussion  -full code -treatment goal is palliative   PLAN: -Hold kyprolis today -R/s next week after completing RT -complete RT 1/5 -f/u next week   No problem-specific Assessment & Plan notes found for this encounter.   No orders of the defined types were placed in this encounter.  All questions were answered. The patient knows  to call the clinic with any problems, questions or concerns. No barriers to learning was detected. I spent 20 minutes counseling the patient face to face. The total time spent in the appointment was 25 minutes and more than 50%  was on counseling and review of test results     Alla Feeling, NP 07/13/19

## 2019-07-13 ENCOUNTER — Inpatient Hospital Stay: Payer: Medicare Other

## 2019-07-13 ENCOUNTER — Other Ambulatory Visit: Payer: Self-pay

## 2019-07-13 ENCOUNTER — Ambulatory Visit: Payer: Medicare Other

## 2019-07-13 ENCOUNTER — Encounter: Payer: Self-pay | Admitting: Nurse Practitioner

## 2019-07-13 ENCOUNTER — Ambulatory Visit
Admission: RE | Admit: 2019-07-13 | Discharge: 2019-07-13 | Disposition: A | Discharge status: Home / Self Care | Payer: Medicare Other | Source: Ambulatory Visit | Attending: Radiation Oncology | Admitting: Radiation Oncology

## 2019-07-13 ENCOUNTER — Inpatient Hospital Stay: Payer: Medicare Other | Admitting: Hematology

## 2019-07-13 ENCOUNTER — Telehealth: Payer: Self-pay | Admitting: Hematology

## 2019-07-13 DIAGNOSIS — Z510 Encounter for antineoplastic radiation therapy: Secondary | ICD-10-CM | POA: Diagnosis not present

## 2019-07-13 DIAGNOSIS — C9000 Multiple myeloma not having achieved remission: Secondary | ICD-10-CM | POA: Diagnosis not present

## 2019-07-13 NOTE — Telephone Encounter (Signed)
Scheduled appt per 12/28 los.  Left a vm of the appt date and time.

## 2019-07-14 ENCOUNTER — Ambulatory Visit: Payer: Medicare Other

## 2019-07-14 ENCOUNTER — Other Ambulatory Visit: Payer: Self-pay

## 2019-07-14 ENCOUNTER — Ambulatory Visit
Admission: RE | Admit: 2019-07-14 | Discharge: 2019-07-14 | Disposition: A | Discharge status: Home / Self Care | Payer: Medicare Other | Source: Ambulatory Visit | Attending: Urology | Admitting: Urology

## 2019-07-14 DIAGNOSIS — Z510 Encounter for antineoplastic radiation therapy: Secondary | ICD-10-CM | POA: Diagnosis not present

## 2019-07-14 DIAGNOSIS — C9000 Multiple myeloma not having achieved remission: Secondary | ICD-10-CM | POA: Diagnosis not present

## 2019-07-15 ENCOUNTER — Ambulatory Visit: Payer: Medicare Other

## 2019-07-15 ENCOUNTER — Ambulatory Visit
Admission: RE | Admit: 2019-07-15 | Discharge: 2019-07-15 | Disposition: A | Discharge status: Home / Self Care | Payer: Medicare Other | Source: Ambulatory Visit | Attending: Radiation Oncology | Admitting: Radiation Oncology

## 2019-07-15 ENCOUNTER — Other Ambulatory Visit: Payer: Self-pay

## 2019-07-15 DIAGNOSIS — C9000 Multiple myeloma not having achieved remission: Secondary | ICD-10-CM | POA: Diagnosis not present

## 2019-07-15 DIAGNOSIS — Z510 Encounter for antineoplastic radiation therapy: Secondary | ICD-10-CM | POA: Diagnosis not present

## 2019-07-19 ENCOUNTER — Ambulatory Visit: Payer: Medicare Other

## 2019-07-19 ENCOUNTER — Inpatient Hospital Stay: Payer: Medicare Other | Admitting: Nurse Practitioner

## 2019-07-19 ENCOUNTER — Inpatient Hospital Stay: Payer: Medicare Other

## 2019-07-19 ENCOUNTER — Ambulatory Visit
Admission: RE | Admit: 2019-07-19 | Discharge: 2019-07-19 | Disposition: A | Discharge status: Home / Self Care | Payer: Medicare Other | Source: Ambulatory Visit | Attending: Radiation Oncology | Admitting: Radiation Oncology

## 2019-07-19 DIAGNOSIS — C9000 Multiple myeloma not having achieved remission: Secondary | ICD-10-CM | POA: Insufficient documentation

## 2019-07-19 DIAGNOSIS — Z510 Encounter for antineoplastic radiation therapy: Secondary | ICD-10-CM | POA: Diagnosis not present

## 2019-07-20 ENCOUNTER — Other Ambulatory Visit: Payer: Self-pay

## 2019-07-20 ENCOUNTER — Encounter: Payer: Self-pay | Admitting: Radiation Oncology

## 2019-07-20 ENCOUNTER — Ambulatory Visit: Payer: Medicare Other

## 2019-07-20 ENCOUNTER — Ambulatory Visit
Admission: RE | Admit: 2019-07-20 | Discharge: 2019-07-20 | Disposition: A | Discharge status: Home / Self Care | Payer: Medicare Other | Source: Ambulatory Visit | Attending: Radiation Oncology | Admitting: Radiation Oncology

## 2019-07-20 DIAGNOSIS — Z510 Encounter for antineoplastic radiation therapy: Secondary | ICD-10-CM | POA: Diagnosis not present

## 2019-07-20 DIAGNOSIS — C9000 Multiple myeloma not having achieved remission: Secondary | ICD-10-CM | POA: Diagnosis not present

## 2019-07-20 DIAGNOSIS — C7951 Secondary malignant neoplasm of bone: Secondary | ICD-10-CM | POA: Diagnosis not present

## 2019-07-21 ENCOUNTER — Ambulatory Visit: Payer: Medicare Other

## 2019-07-21 NOTE — Progress Notes (Signed)
Shannon Rush   [phone] [phone]   Clinic Follow up Note   Patient Care Team: Shannon Resides, MD as PCP - General (Family Medicine) Shannon Spark, MD as PCP - Cardiology (Cardiology) Shannon Gell, MD as Attending Physician (Obstetrics and Gynecology) Shannon Lewandowsky, MD as Consulting Physician (Internal Medicine) Shannon Merle, MD as Consulting Physician (Hematology) Shannon Fillers, MD as Consulting Physician (Ophthalmology) Shannon Spark, MD as Consulting Physician (Cardiology) Shannon Sine, MD as Consulting Physician (Dermatology) Shannon Rush (Dentistry) Shannon Rush, Shannon Rush as Consulting Physician (Podiatry) Shannon Rush, Shannon Gibson, Shannon Rush as [place] Management  Date of Service:  07/22/2019  CHIEF COMPLAINT: F/u of MM  SUMMARY OF ONCOLOGIC HISTORY: Oncology History Overview Note  Multiple myeloma [facility])   Staging form: Multiple Myeloma, AJCC 6th Edition   - Clinical: Stage IIIA - Signed by [name], MD on 09/04/2013    Multiple myeloma ([place])  05/26/2012 Initial Diagnosis   Presenting IgG was 4,040 mg/dL on 06/05/2012 (IgA 35; IgM 34); kappa free light chain 34.4 mg/dL, lambda 0.00, kappa:lambda ratio of 34.75; SPEP with M-spike of 2.60.   06/30/2012 Imaging   Numerous lytic lesions throughout the calvarium.  Lytic lesion within the left lateral scapula.  Findings most compatible with metastases or myeloma. Multiple mid and lower thoracic compression fractures.  Slight compression through the endplates at L3.   53/97/6734 Bone Marrow Biopsy   Bone marrow biopsy showed 49% plasma cell. Normal classical cytogenetics; however, myeloma FISH panel showed 13q- (intermediate risk)       07/14/2012 - 07/27/2012 Radiation Therapy   Palliative radiation 20 Gy over 10 fractions between to thoracic spine cord compression and symptomatic left scapula lytic lesion.   08/10/2012 - 11/2012 Chemotherapy   Started SQ Velcade once weekly, 3 weeks on, 1 weeks off; daily Revlimid d1-21, 7 days off; and Dexamethasone 18m PO weekly.    02/12/2013 Bone Marrow Transplant   [facility]    06/14/2013 Tumor Marker   IgG  1830,  Kappa:lamba ratio, 1.69 (baseline was 34.75 or   M-spike 0.28 (baseline of 2.6 or  89.3% of baseline).     06/25/2013 -  Chemotherapy   Started zometa 3.5 mg monthly    09/01/2013 - 01/2014 Chemotherapy   Maintenance therapy with revlimid 542mdaily for 14 days and then off for 7 (decreased from 21 days on and 7 days off based on neutropenia). Stopped due to disease progression 01/2017   09/13/2013 - 09/17/2014 Hospital Admission   Hospital admission for pneumonia   12/20/2013 Treatment Plan Change   Maintenance therapy decreased to 2.5 mg daily for 21 days and then off for 7 days based on low counts.    01/25/2014 Tumor Marker   IgG 1360 M spike 0.5   03/01/2014 - 03/05/2014 Hospital Admission   [facility], received thrombolytic therapy, cath showed 60% prox RCA, mid-distal RCA 50%, Echo normal, no stents.   04/11/2014 - 08/07/2014 Chemotherapy   Continue Revlimid at 2.5 mg 3 weeks on 1 week off   08/08/2014 - 08/13/2014 Hospital Admission   Hospital admission for pneumonia. Her Revlimid was held.   08/29/2014 - 09/14/2014 Chemotherapy   Maintenance Revlimid restarted   09/14/2014 - 09/17/2014 Hospital Admission   [place].   11/13/2014 - 12/25/2015 Chemotherapy   Maintenance Revlimid restarted, changed to  2.65m daily, 2 weks on, 1 week off from 12/15/2014, stopped due to disease progression    01/24/2016 Progression   Patient is M protein has gradually increased to 1.0g, repeated a bone marrow biopsy showed plasma cell 10-30%   02/27/2016 - 03/07/2016 Chemotherapy   Pomalidomide 4 mg daily on day 1-21, dexamethasone 40 mg on day 1,  8 and 15, every 28 days, started on 02/27/2016, daratumumab weekly started on 03/07/2016, held after first dose, due to hospitalization and a severe pancytopenia, pomalidomide was stopped afterwards    03/10/2016 - 03/29/2016 Hospital Admission   Pt was admitted for sepsis from pneumonia, and severe pancytopenia. She required ICU stay for a few days due to hypotension, developed b/l pleural effusion required diuretics and b/l thoracentesis. She also required blood and plt transfusion, and prolonged neupogen injection for severe neutropenia. She was discharged home after 3 weeks hospital stay    04/26/2016 - 04/26/2019 Chemotherapy   Daratumumab restarted on 04/26/2016, Velcade 1.351mm2 and dexa 2050meekly start on 11/3, velcade held since 06/28/2016 due to CHF, dexa stopped on12/11/2017 due to high risk of fracture.  Dara reduced to every 4 weeks due to COVID-19 starting 10/23/18. Returned to every 3 weeks starting 12/17/18. Restarted every 2 weeks starting 02/22/19 Stopped on 04/26/19 due to disease progression.    08/26/2016 Echocardiogram   - Left ventricle: LVEF is approximately 35% with diffuse diffuse   hypokinesis with severe hypokinesis/ akinesis of the   inferior/inferoseptal walls. In comparison to echo images from   December 2017, there does not appear to be significant change The   cavity size was normal. Wall thickness was normal. Doppler   parameters are consistent with abnormal left ventricular   relaxation (grade 1 diastolic dysfunction). - Aortic valve: AV is thickened. 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 Present in echo from December 2017 There was   trivial regurgitation. - Right ventricle: Systolic function was mildly reduced. - Right atrium: The atrium was mildly dilated. - Pericardium, extracardiac: A trivial pericardial effusion was   identified.   12/03/2016 Echocardiogram   -Left Ventricle: Systolic  function was   mildly to moderately reduced. The estimated ejection fraction was   in the range of 40% to 45%. Diffuse hypokinesis. Doppler   parameters are consistent with abnormal left ventricular   relaxation (grade 1 diastolic dysfunction). Doppler parameters   are consistent with indeterminate ventricular filling pressure. - Left atrium: The atrium was severely dilated. - Tricuspid valve: There was trivial regurgitation. - Pulmonary arteries: Systolic pressure was within the normal   range. PA peak pressure: 33 mm Hg (S). - Global longitudinal strain -10.5% (abnormal)   03/12/2017 Echocardiogram   ECHO 03/12/17 Study Conclusions - Left ventricle: The cavity size was normal. Wall thickness was   increased in a pattern of mild LVH. Systolic function was normal.   The estimated ejection fraction was in the range of 50% to 55%.   There is hypokinesis of the basalinferior myocardium. Doppler   parameters are consistent with abnormal left ventricular   relaxation (grade 1 diastolic dysfunction). - Aortic valve: There was trivial regurgitation. - Mitral valve: Calcified annulus. Mildly thickened leaflets .   There was trivial regurgitation. - Tricuspid valve: There was mild regurgitation. Impressions: - There has been mild improvement in EF since prior study.    12/12/2017 - 12/15/2017 Hospital Admission   Admission diagnosis: Pnuemonia and acute hypoxic respiratory failure Additional comments: treated  for pneumoania and acute hypoxic respiratory failure before she was discharged on 12/15/17 with oral anitbiotics. She did not require home oxygen.    12/30/2017 - 01/02/2018 Hospital Admission   Admission diagnosis: Pnuemonia  Additional comments: She was again hospitalized on 12/30/17 for pneumonia, ID work up was otherwise negative, she was discharged home with oral antibiotics.    04/01/2018 Imaging   04/01/2018 CXR IMPRESSION: Persisting airspace disease superimposed on chronic  changes, suggesting pneumonia given the history. Followup PA and lateral chest X-ray is recommended in 3-4 weeks following therapy to ensure resolution and exclude underlying malignancy.  Redemonstration of mid and lower thoracic compression fractures with associated thoracic kyphosis.   04/23/2018 Echocardiogram   04/23/2018 ECHO LV EF: 45% -   50%   01/07/2019 Imaging   MRI Brain 01/07/19 IMPRESSION: MRI HEAD IMPRESSION:   1. 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 Lesion closely approximates the left inner ear structures and involves the left Meckel's cave and left fifth cranial nerve, and most likely is the causative etiology for patient's underlying left ear and facial symptoms. Follow-up examination with postcontrast imaging suggested for complete evaluation. Inclusion of IAC sequences would likely be helpful for visualization. 2. Additional 2.7 cm lesion involving the right occipital calvarium with adjacent marrow signal abnormality, also likely related to multiple myeloma. Metastatic disease would be the primary differential consideration. 3. Underlying age-appropriate cerebral atrophy with mild chronic microvascular ischemic disease. No other acute intracranial abnormality.   MRA HEAD IMPRESSION:   1. Negative intracranial MRA with no large vessel occlusion, hemodynamically significant stenosis, or other acute vascular abnormality. 2. Petrous and cavernous left ICA is partially encased and surrounded by the left petrous apex lesion without significant irregularity or stenosis. 3. 2 mm right paraophthalmic aneurysm.   MRA NECK IMPRESSION:   Normal MRA of the neck with wide patency of the carotid and vertebral arteries bilaterally. No hemodynamically significant stenosis or other acute vascular abnormality identified.     01/14/2019 - 01/27/2019 Radiation Therapy   Radaition to Left petrous apex bone lesion 01/14/19-01/27/19 with Dr. Tammi Rush.    04/01/2019 Pathology Results   Bone Marrow biopsy 04/01/19  DIAGNOSIS: BONE MARROW, ASPIRATE, CLOT, CORE: - Normocellular marrow with minimal involvement by plasma cell neoplasm - See comment PERIPHERAL BLOOD: - Macrocytic anemia and leukopenia -See complete blood count   04/09/2019 PET scan   PET 04/09/19  IMPRESSION: 1. Scattered lytic lesions of bone. A few of these demonstrate hypermetabolic activity, most notably the right occipital bone lesion; the lytic lesion in the approximately C7 spinous process; the lytic expansile lesion of the left fourth rib anteriorly; and a small lytic focus in the right femoral head. Other scattered lytic lesions have only low-grade activity. 2. Multiple fractures as detailed above. 3. Other imaging findings of potential clinical significance: Aortic Atherosclerosis (ICD10-I70.0). Coronary atherosclerosis.   05/10/2019 -  Chemotherapy   Velcade q2weeeks and Dexa 65m weekly was added on 05/10/19. Switched to CyBorD (Oral cyclophosphamide CYTOXAN 5032m IV Velcade 1.66m28m2, and oral dexa 33m31meekly) starting 05/24/19. -changed Velcade to weekly Kyprolis 3 weeks on/1 week off after 2 doses on 06/14/19 due to neuropathy.    07/05/2019 - 07/20/2019 Radiation Therapy   Radiation to right occipital lesion per Dr. MannTammi Klippel21/20-07/20/19.      CURRENT THERAPY:  -CyBorD (cyclophosphamide (CYTOXAN) 500mg44m0mg/28mVelcade 1.66mg/m242mnd dexa 33mg, w47my) starting 05/24/19. Due to  neuropathy after 2 doses of Velcade, I switched to weekly Kyprolis 3 weeks on/1 week off after 2 doses on 06/14/19. Chemo help for RT 07/05/19-07/22/19.  -Restart Zometa injections every 6 months on 06/21/19.   INTERVAL HISTORY:  Shannon Rush is here for a follow up and treatment. She presents to the clinic alone. She notes she forgot to take her morning  medication, but she was able to take Dexa and waiting to take Cytoxan in clinic.  She notes she completed Radiation. She feels mild hair loss in that right occipital lobe area. She notes her hip pain is 90% better now. She no longer needs to use walker. She was able to bike here again.     REVIEW OF SYSTEMS:   Constitutional: Denies fevers, chills or abnormal weight loss Eyes: Denies blurriness of vision Ears, nose, mouth, throat, and face: Denies mucositis or sore throat Respiratory: Denies cough, dyspnea or wheezes Cardiovascular: Denies palpitation, chest discomfort or lower extremity swelling Gastrointestinal:  Denies nausea, heartburn or change in bowel habits Skin: Denies abnormal skin rashes Lymphatics: Denies new lymphadenopathy or easy bruising Neurological:Denies numbness, tingling or new weaknesses Behavioral/Psych: Mood is stable, no new changes  All other systems were reviewed with the patient and are negative.  MEDICAL HISTORY:  Past Medical History:  Diagnosis Date  . CAD (coronary artery disease)    a. inf STEMI (in [place] >>> [facility]) >>> LHC (8/15- [place]):  pRCA 60%, mid to dist RCA 50% >>> med rx  . Cardiomyopathy ([place])   . Cataract   . Chronic combined systolic and diastolic CHF (congestive heart failure) ([place])    cancer medication related   . Cord compression ([place]) 07/13/12   MRI- diffuse myeloma involvement of T-L spine  . History of radiation therapy 07/13/12-07/27/12   spinal cord compression T3-T10,left scapula  . 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 Multiple myeloma ([place]) 07/01/2012  . MVP (mitral valve prolapse)    a. trivial by echo 06/2017.  . OSTEOPOROSIS 06/11/2010   Multiple compression fractures; and spontaneous fracture of sternum Qualifier: Diagnosis of  By: [name], [name]     . Thoracic kyphosis 07/13/12   per MRI scan  . Unspecified deficiency anemia     SURGICAL HISTORY: Past Surgical History:  Procedure  Laterality Date  . APPENDECTOMY    . CATARACT EXTRACTION, BILATERAL    . CESAREAN SECTION     x2   . COLONOSCOPY  2007   neg with Dr. [name]  . ELBOW SURGERY    . HIP SURGERY  2009   left  . TUBAL LIGATION      I have reviewed the social history and family history with the patient and they are unchanged from previous note.  ALLERGIES:  is allergic to zithromax [azithromycin]; zosyn [piperacillin sod-tazobactam so]; and quinolones.  MEDICATIONS:  Current Outpatient Medications  Medication Sig Dispense Refill  . acetaminophen (TYLENOL) 500 MG tablet Take 500 mg by mouth every 6 (six) hours as needed for moderate pain or fever.    Marland Kitchen acyclovir (ZOVIRAX) 400 MG tablet Take 1 tablet (400 mg total) by mouth 2 (two) times daily. 180 tablet 3  . aspirin EC 81 MG tablet Take 1 tablet (81 mg total) by mouth daily. 90 tablet 3  . atorvastatin (LIPITOR) 20 MG tablet Take 1 tablet (20 mg total) by mouth daily. 10 tablet 0  . Calcium Carbonate-Vit D-Min (CALCIUM 600+D PLUS MINERALS) 600-400 MG-UNIT TABS Take by  mouth.    . calcium-vitamin D (OSCAL WITH D) 500-200 MG-UNIT TABS tablet Take by mouth.    . cyclophosphamide (CYTOXAN) 50 MG capsule Take 10 capsules (500 mg total) by mouth once a week. Take 300 mg/m2 by mouth with breakfast once weekly. Take with food to minimize GI upset. Take early in the day and maintain hydration. (Patient taking differently: Take 500 mg/m2 by mouth once a week. Take 300 mg/m2 by mouth with breakfast once weekly. Take with food to minimize GI upset. Take early in the day and maintain hydration.) 40 capsule 3  . dexamethasone (DECADRON) 4 MG tablet Take 5 tablets (20 mg total) by mouth once a week. Take on the day of chemo 20 tablet 3  . doxycycline (VIBRA-TABS) 100 MG tablet Take 100 mg by mouth as needed.    . metoprolol succinate (TOPROL-XL) 25 MG 24 hr tablet Take 1 tablet (25 mg total) by mouth daily. 15 tablet 0  . ondansetron (ZOFRAN) 8 MG tablet Take 1 tablet (8 mg  total) by mouth every 8 (eight) hours as needed for nausea or vomiting. 20 tablet 3  . prochlorperazine (COMPAZINE) 10 MG tablet Take 1 tablet (10 mg total) by mouth every 6 (six) hours as needed (Nausea or vomiting). 30 tablet 1   No current facility-administered medications for this visit.    PHYSICAL EXAMINATION: ECOG PERFORMANCE STATUS: 2 - Symptomatic, <50% confined to bed  Vitals:   07/22/19 1302  BP: 128/81  Pulse: (!) 102  Resp: 18  Temp: (!) 97.4 F (36.3 C)  SpO2: 98%   Filed Weights   07/22/19 1302  Weight: 111 lb 3.2 oz (50.4 kg)    GENERAL:alert, no distress and comfortable SKIN: skin color, texture, turgor are normal, no rashes or significant lesions EYES: normal, Conjunctiva are pink and non-injected, sclera clear  NECK: supple, thyroid normal size, non-tender, without nodularity LYMPH:  no palpable lymphadenopathy in the cervical, axillary  LUNGS: clear to auscultation and percussion with normal breathing effort HEART: regular rate & rhythm and no murmurs and no lower extremity edema ABDOMEN:abdomen soft, non-tender and normal bowel sounds Musculoskeletal:no cyanosis of digits and no clubbing  NEURO: alert & oriented x 3 with fluent speech, no focal motor/sensory deficits  LABORATORY DATA:  I have reviewed the data as listed CBC Latest Ref Rng & Units 07/22/2019 07/12/2019 06/28/2019  WBC 4.0 - 10.5 K/uL 4.3 3.7(L) 3.9(L)  Hemoglobin 12.0 - 15.0 g/dL 10.5(L) 9.9(L) 9.3(L)  Hematocrit 36.0 - 46.0 % 30.9(L) 29.8(L) 27.5(L)  Platelets 150 - 400 K/uL 163 206 94(L)     CMP Latest Ref Rng & Units 07/22/2019 07/12/2019 06/28/2019  Glucose 70 - 99 mg/dL 93 100(H) 150(H)  BUN 8 - 23 mg/dL _0 Creatinine 0.44 - 1.00 mg/dL 0.73 0.74 0.76  Sodium 135 - 145 mmol/L 137 137 133(L)  Potassium 3.5 - 5.1 mmol/L 4.1 4.1 4.2  Chloride 98 - 111 mmol/L 105 104 104  CO2 22 - 32 mmol/L 22 23 20(L)  Calcium 8.9 - 10.3 mg/dL 8.6(L) 8.6(L) 8.5(L)  Total Protein 6.5 - 8.1  g/dL 6.6 6.5 6.6  Total Bilirubin 0.3 - 1.2 mg/dL 0.6 0.6 0.7  Alkaline Phos 38 - 126 U/L 121 121 93  AST 15 - 41 U/L _1 ALT 0 - 44 U/L _2 RADIOGRAPHIC STUDIES: I have personally reviewed the radiological images as listed and agreed with the findings in the report. No  results found.   ASSESSMENT & PLAN:  Shannon Rush is a 79 y.o. female with   1. IgG kappa Multiple myeloma s/p Auto SCT, intermediate risk, relapsed in 7/2017and progressed again in Oct 2020 -Diagnosed in 05/2012. Treated with induction chemo, radiation and BM transplant, but unfortunately progressed while on maintenance Revlimid 01/2016. She is currently onDaratumumabinfusion monotherapy.  -PETimagesfrom 04/09/19 showedScattered lytic lesions of bone. A few of these demonstrate hypermetabolic activity, most notably the right occipital bone lesion, C7 spinous process, Left 4th rib, andright femoral head.  -Herbone marrow biopsy from 04/02/19 which showsnormocellular marrow with minimal involvement by plasma cell neoplasm(2%). -she has also developed worsening anemia, and mild thrombocytopenia, which is related to her disease progression -Based on Dr. Noah Delaine recommendation I changedher treatment to CyBorD (oral cyclophosphamide 575m, Velcade 1.315mm2, and dexa 2071mweekly) started on11/9/20. After 2 doses of Velcade, I switched to weekly Kyprolis 3 weeks on/1 week off starting 06/14/19 due to neuropathy.  -Chemo was held for RT. Plan to restart today. Labs reviewed and adequate to proceed with D1 Kyprolis today. MM panel is still pending today.  -Continue weekly Dexa and Cytoxan  -F/u next week with NP Lacie. I encouraged her to continue COVID19 precautions. I also encouraged her to eat adequately and use carnation breakfast or protein powder to help gain weight.   2. New left hip pain  -she had left hip surgery with rod in 2009  -She developed new left hip pain on weightbearing, no pain  at resting  -My nurse called her orthopedic office today and requested an appointment for her  -Her pain is 90% improved per pt. She no longer needs walker and was able to bike to clinic today.   3. Left petrous apex bone lesion with hearing loss and Right OccipitalskullLesion  -Her MRI brain from 01/07/19 showedabnormal osseous lesion measuring up to approximately 5.9 cm centered at the left petrous apex. Thismost likely is the cause of her underlyingleft ear and balancesymptoms. -Given she is symptomatic, sheunderwent target RT with Dr ManTammi Klippel1-7/15. This is negative on9/25/20PET scan -Symptoms persist, she has stable muffled hearing and mild imbalanceissue.Stable -Her PET from 04/09/19 shows lesion with hypermetabolic activity in this area concerning for MM progression here. -She recently completed RT to right occipital lesion per Dr. ManTammi Rush/21/20-07/20/19. Tolerated well.   4. History of multiple pneumonia andbronchitis -She has had multiple episodes of pneumonia, required hospitalization,and bronchitis, required antibiotics -She has recovered well, will continue monitoring. I encouraged her to do spirometry,and exercise. -Her vaccines are up-to-date.  5. Pancytopenia -She mainly has anemia and Thrombocytopenia concerning which occurred with recent (03/2019) disease progression. -Did give blood transfusion on 06/04/19  -Improved during chemo break. No need for blood transfusion.   6. CHF, CAD, inferior STEMI in 02/2014, HLD -She is on ASA, BB, statin. She will continue to f/u with Dr. NelMeda CoffeePwell controlled and normal lately. -04/2019 echo showed normal EF, will continue monitoring   7. Osteoporosis with multiple fractures -Last DEXA in 2015 showed worsening osteoporosis. She declined repeat DEXA. -She had a fall induced fracture in 01/2018. -She was on Zometa for 4 years, stopped after April 2018 -She has recent left hip pain since 11/24. She does  not hip surgery in 2009. Will obtain Xray for further evaluation.  -I discussed restarting Zometa to help strengthen her bone, she is agreeable. Plan to start 06/21/19. Will monitor her dental health   8. Peripheral neuropathy in feet, G2 -Secondary to previous chemo. -She  previously declined Gabapentin -Worse lately since she started Velcade, will change Velcade to Kyprolis which cause less neuropathyon 06/14/19.   9. Goal of care discussion  -she is full code now  10 Steroid induced hyperglycemia -Secondary to steroids, her random blood glucose was 217 last week -I encouraged her to drink plenty of water and reduce sugar and carbohydrate in diet.  -I will check her A1c at next visit. If remains uncontrolled may start her on DM medication.    Plan -Labs reviewed and adequate to proceed with D1 Kyprolis today and day 8 and 15 every 28 days.  -Continue Dexa and Cytoxan on day 1, 8, 15 every 28 days  -F/u with NP Lacie in 2 week    No problem-specific Assessment & Plan notes found for this encounter.   No orders of the defined types were placed in this encounter.  All questions were answered. The patient knows to call the clinic with any problems, questions or concerns. No barriers to learning was detected. The total time spent in the appointment was 30 minutes.     Shannon Merle, MD 07/22/2019   I, Joslyn Devon, am acting as scribe for Shannon Merle, MD.   I have reviewed the above documentation for accuracy and completeness, and I agree with the above.

## 2019-07-22 ENCOUNTER — Inpatient Hospital Stay: Payer: Medicare Other

## 2019-07-22 ENCOUNTER — Ambulatory Visit: Payer: Medicare Other

## 2019-07-22 ENCOUNTER — Inpatient Hospital Stay: Payer: Medicare Other | Attending: Hematology

## 2019-07-22 ENCOUNTER — Other Ambulatory Visit: Payer: Self-pay

## 2019-07-22 ENCOUNTER — Encounter: Payer: Self-pay | Admitting: Hematology

## 2019-07-22 ENCOUNTER — Inpatient Hospital Stay (HOSPITAL_BASED_OUTPATIENT_CLINIC_OR_DEPARTMENT_OTHER): Payer: Medicare Other | Admitting: Hematology

## 2019-07-22 VITALS — BP 128/81 | HR 102 | Temp 97.4°F | Resp 18 | Ht 67.0 in | Wt 111.2 lb

## 2019-07-22 VITALS — HR 82 | Temp 97.6°F

## 2019-07-22 DIAGNOSIS — T380X5A Adverse effect of glucocorticoids and synthetic analogues, initial encounter: Secondary | ICD-10-CM | POA: Diagnosis not present

## 2019-07-22 DIAGNOSIS — Z5112 Encounter for antineoplastic immunotherapy: Secondary | ICD-10-CM | POA: Diagnosis not present

## 2019-07-22 DIAGNOSIS — Z923 Personal history of irradiation: Secondary | ICD-10-CM | POA: Diagnosis not present

## 2019-07-22 DIAGNOSIS — D61818 Other pancytopenia: Secondary | ICD-10-CM | POA: Diagnosis not present

## 2019-07-22 DIAGNOSIS — M810 Age-related osteoporosis without current pathological fracture: Secondary | ICD-10-CM | POA: Insufficient documentation

## 2019-07-22 DIAGNOSIS — C9002 Multiple myeloma in relapse: Secondary | ICD-10-CM

## 2019-07-22 DIAGNOSIS — Z7952 Long term (current) use of systemic steroids: Secondary | ICD-10-CM | POA: Diagnosis not present

## 2019-07-22 DIAGNOSIS — C9000 Multiple myeloma not having achieved remission: Secondary | ICD-10-CM

## 2019-07-22 DIAGNOSIS — G620 Drug-induced polyneuropathy: Secondary | ICD-10-CM | POA: Diagnosis not present

## 2019-07-22 DIAGNOSIS — I252 Old myocardial infarction: Secondary | ICD-10-CM

## 2019-07-22 DIAGNOSIS — Z79899 Other long term (current) drug therapy: Secondary | ICD-10-CM | POA: Diagnosis not present

## 2019-07-22 DIAGNOSIS — Z9221 Personal history of antineoplastic chemotherapy: Secondary | ICD-10-CM | POA: Diagnosis not present

## 2019-07-22 DIAGNOSIS — M818 Other osteoporosis without current pathological fracture: Secondary | ICD-10-CM | POA: Diagnosis not present

## 2019-07-22 DIAGNOSIS — R739 Hyperglycemia, unspecified: Secondary | ICD-10-CM | POA: Diagnosis not present

## 2019-07-22 DIAGNOSIS — Z949 Transplanted organ and tissue status, unspecified: Secondary | ICD-10-CM

## 2019-07-22 DIAGNOSIS — Z7982 Long term (current) use of aspirin: Secondary | ICD-10-CM | POA: Diagnosis not present

## 2019-07-22 LAB — CBC WITH DIFFERENTIAL (CANCER CENTER ONLY)
Abs Immature Granulocytes: 0.04 10*3/uL (ref 0.00–0.07)
Basophils Absolute: 0 10*3/uL (ref 0.0–0.1)
Basophils Relative: 1 %
Eosinophils Absolute: 0.1 10*3/uL (ref 0.0–0.5)
Eosinophils Relative: 2 %
HCT: 30.9 % — ABNORMAL LOW (ref 36.0–46.0)
Hemoglobin: 10.5 g/dL — ABNORMAL LOW (ref 12.0–15.0)
Immature Granulocytes: 1 %
Lymphocytes Relative: 3 %
Lymphs Abs: 0.1 10*3/uL — ABNORMAL LOW (ref 0.7–4.0)
MCH: 35.6 pg — ABNORMAL HIGH (ref 26.0–34.0)
MCHC: 34 g/dL (ref 30.0–36.0)
MCV: 104.7 fL — ABNORMAL HIGH (ref 80.0–100.0)
Monocytes Absolute: 0.5 10*3/uL (ref 0.1–1.0)
Monocytes Relative: 11 %
Neutro Abs: 3.6 10*3/uL (ref 1.7–7.7)
Neutrophils Relative %: 82 %
Platelet Count: 163 10*3/uL (ref 150–400)
RBC: 2.95 MIL/uL — ABNORMAL LOW (ref 3.87–5.11)
RDW: 19.4 % — ABNORMAL HIGH (ref 11.5–15.5)
WBC Count: 4.3 10*3/uL (ref 4.0–10.5)
nRBC: 0 % (ref 0.0–0.2)

## 2019-07-22 LAB — CMP (CANCER CENTER ONLY)
ALT: 16 U/L (ref 0–44)
AST: 17 U/L (ref 15–41)
Albumin: 3.6 g/dL (ref 3.5–5.0)
Alkaline Phosphatase: 121 U/L (ref 38–126)
Anion gap: 10 (ref 5–15)
BUN: 16 mg/dL (ref 8–23)
CO2: 22 mmol/L (ref 22–32)
Calcium: 8.6 mg/dL — ABNORMAL LOW (ref 8.9–10.3)
Chloride: 105 mmol/L (ref 98–111)
Creatinine: 0.73 mg/dL (ref 0.44–1.00)
GFR, Est AFR Am: >60 mL/min (ref >60)
GFR, Estimated: >60 mL/min (ref >60)
Glucose, Bld: 93 mg/dL (ref 70–99)
Potassium: 4.1 mmol/L (ref 3.5–5.1)
Sodium: 137 mmol/L (ref 135–145)
Total Bilirubin: 0.6 mg/dL (ref 0.3–1.2)
Total Protein: 6.6 g/dL (ref 6.5–8.1)

## 2019-07-22 MED ORDER — DEXTROSE 5 % IV SOLN
69.0000 mg/m2 | Freq: Once | INTRAVENOUS | Status: AC
  Administered 2019-07-22: 110 mg via INTRAVENOUS
  Filled 2019-07-22: qty 10

## 2019-07-22 MED ORDER — PALONOSETRON HCL INJECTION 0.25 MG/5ML
INTRAVENOUS | Status: AC
  Filled 2019-07-22: qty 5

## 2019-07-22 MED ORDER — PALONOSETRON HCL INJECTION 0.25 MG/5ML
0.2500 mg | Freq: Once | INTRAVENOUS | Status: AC
  Administered 2019-07-22: 0.25 mg via INTRAVENOUS

## 2019-07-22 MED ORDER — SODIUM CHLORIDE 0.9 % IV SOLN
Freq: Once | INTRAVENOUS | Status: AC
  Filled 2019-07-22: qty 250

## 2019-07-22 MED ORDER — DEXAMETHASONE 4 MG PO TABS
20.0000 mg | ORAL_TABLET | Freq: Once | ORAL | Status: AC
  Administered 2019-07-22: 20 mg via ORAL

## 2019-07-22 MED ORDER — DEXAMETHASONE 4 MG PO TABS
ORAL_TABLET | ORAL | Status: AC
  Filled 2019-07-22: qty 5

## 2019-07-22 NOTE — Patient Instructions (Signed)
[facility] Discharge Instructions for Patients Receiving Chemotherapy  Today you received the following chemotherapy agents Carfilzomib (KYPROLIS).  To help prevent nausea and vomiting after your treatment, we encourage you to take your nausea medication as prescribed.   If you develop nausea and vomiting that is not controlled by your nausea medication, call the clinic.   BELOW ARE SYMPTOMS THAT SHOULD BE REPORTED IMMEDIATELY:  *FEVER GREATER THAN 100.5 F  *CHILLS WITH OR WITHOUT FEVER  NAUSEA AND VOMITING THAT IS NOT CONTROLLED WITH YOUR NAUSEA MEDICATION  *UNUSUAL SHORTNESS OF BREATH  *UNUSUAL BRUISING OR BLEEDING  TENDERNESS IN MOUTH AND THROAT WITH OR WITHOUT PRESENCE OF ULCERS  *URINARY PROBLEMS  *BOWEL PROBLEMS  UNUSUAL RASH Items with * indicate a potential emergency and should be followed up as soon as possible.  Feel free to call the clinic should you have any questions or concerns. The clinic phone number is [phone].  Please show the [facility] at check-in to the Emergency Department and triage nurse.  Coronavirus (COVID-19) Are you at risk?  Are you at risk for the Coronavirus (COVID-19)?  To be considered HIGH RISK for Coronavirus (COVID-19), you have to meet the following criteria:  . Traveled to [place], [place], [place], [place] or [place]; or in the [place] to [place], [place], [place], or [place]; and have fever, cough, and shortness of breath within the last 2 weeks of travel OR . Been in close contact with a person diagnosed with COVID-19 within the last 2 weeks and have fever, cough, and shortness of breath . IF YOU DO NOT MEET THESE CRITERIA, YOU ARE CONSIDERED LOW RISK FOR COVID-19.  What to do if you are HIGH RISK for COVID-19?  Marland Kitchen If you are having a medical emergency, call 911. . Seek medical care right away. Before you go to a doctor's office, urgent care or emergency department, call ahead and tell them  about your recent travel, contact with someone diagnosed with COVID-19, and your symptoms. You should receive instructions from your physician's office regarding next steps of care.  . When you arrive at healthcare provider, tell the healthcare staff immediately you have returned from visiting [place], [place], [place], [place] or [place]; or traveled in the [place] to [place], [place], [place], or [place]; in the last two weeks or you have been in close contact with a person diagnosed with COVID-19 in the last 2 weeks.   . Tell the health care staff about your symptoms: fever, cough and shortness of breath. . After you have been seen by a medical provider, you will be either: o Tested for (COVID-19) and discharged home on quarantine except to seek medical care if symptoms worsen, and asked to  - Stay home and avoid contact with others until you get your results (4-5 days)  - Avoid travel on public transportation if possible (such as bus, train, or airplane) or o Sent to the Emergency Department by EMS for evaluation, COVID-19 testing, and possible admission depending on your condition and test results.  What to do if you are LOW RISK for COVID-19?  Reduce your risk of any infection by using the same precautions used for avoiding the common cold or flu:  Marland Kitchen Wash your hands often with soap and warm water for at least 20 seconds.  If soap and water are not readily available, use an alcohol-based hand sanitizer with at least 60% alcohol.  . If coughing or  sneezing, cover your mouth and nose by coughing or sneezing into the elbow areas of your shirt or coat, into a tissue or into your sleeve (not your hands). . Avoid shaking hands with others and consider head nods or verbal greetings only. . Avoid touching your eyes, nose, or mouth with unwashed hands.  . Avoid close contact with people who are sick. . Avoid places or events with large numbers of people in one location, like concerts or  sporting events. . Carefully consider travel plans you have or are making. . If you are planning any travel outside or inside the [place], visit the CDC's Travelers' Health webpage for the latest health notices. . If you have some symptoms but not all symptoms, continue to monitor at home and seek medical attention if your symptoms worsen. . If you are having a medical emergency, call 911.   [place] / e-Visit: [email or web address]         [facility]: [facility]: [phone]                   [facility]: [phone]

## 2019-07-23 ENCOUNTER — Ambulatory Visit: Payer: Medicare Other

## 2019-07-23 ENCOUNTER — Telehealth: Payer: Self-pay | Admitting: Hematology

## 2019-07-23 LAB — PROTEIN ELECTROPHORESIS, SERUM
A/G Ratio: 1.4 (ref 0.7–1.7)
Albumin ELP: 3.5 g/dL (ref 2.9–4.4)
Alpha-1-Globulin: 0.3 g/dL (ref 0.0–0.4)
Alpha-2-Globulin: 0.6 g/dL (ref 0.4–1.0)
Beta Globulin: 0.8 g/dL (ref 0.7–1.3)
Gamma Globulin: 0.8 g/dL (ref 0.4–1.8)
Globulin, Total: 2.5 g/dL (ref 2.2–3.9)
M-Spike, %: 0.5 g/dL — ABNORMAL HIGH
Total Protein ELP: 6 g/dL (ref 6.0–8.5)

## 2019-07-23 LAB — KAPPA/LAMBDA LIGHT CHAINS
Kappa free light chain: 18.8 mg/L (ref 3.3–19.4)
Kappa, lambda light chain ratio: 9.4 — ABNORMAL HIGH (ref 0.26–1.65)
Lambda free light chains: 2 mg/L — ABNORMAL LOW (ref 5.7–26.3)

## 2019-07-23 NOTE — Telephone Encounter (Signed)
Scheduled appt per 1/7 los.  Patient will get an update calendar at her next scheduled appt

## 2019-07-25 NOTE — Progress Notes (Addendum)
  Radiation Oncology         [phone] ________________________________  Name: NILZA EAKER MRN: [id number]  Date: 07/20/2019  DOB: 1940/10/04  End of Treatment Note  Diagnosis:   79 yo woman with painful right occipital and left hip involvement by multiple myeloma     Indication for treatment:  Palliation       Radiation treatment dates:   07/05/19-07/20/19  Site/dose:    1.  The Right Occipital skull lesion was treated to 20 Gy in 10 fractions of 2 Gy 2.  The Left Hip lesion was treated to 20 Gy in 10 fractions of 2 Gy  Beams/energy:      1.  The Right Occipital skull lesion was treated using a 3-field 3D set-up 2.  The Left Hip lesion was treated anterior and posterior fields.  Narrative: The patient tolerated radiation treatment relatively well.   Her pain did not improve much.  She did have some hair thinning in the occipital fields.  Plan: The patient has completed radiation treatment. The patient will return to radiation oncology clinic for routine followup in one month. I advised her to call or return sooner if she has any questions or concerns related to her recovery or treatment. ________________________________  Sheral Apley. Tammi Klippel, M.D.

## 2019-07-26 ENCOUNTER — Ambulatory Visit: Payer: Medicare Other | Admitting: Hematology

## 2019-07-26 ENCOUNTER — Other Ambulatory Visit: Payer: Medicare Other

## 2019-07-26 ENCOUNTER — Ambulatory Visit: Payer: Medicare Other

## 2019-07-26 ENCOUNTER — Encounter: Payer: Self-pay | Admitting: Hematology

## 2019-07-26 DIAGNOSIS — Z1231 Encounter for screening mammogram for malignant neoplasm of breast: Secondary | ICD-10-CM | POA: Diagnosis not present

## 2019-07-27 ENCOUNTER — Ambulatory Visit: Payer: Medicare Other

## 2019-07-27 ENCOUNTER — Other Ambulatory Visit: Payer: Medicare Other

## 2019-07-27 ENCOUNTER — Ambulatory Visit: Payer: Medicare Other | Admitting: Nurse Practitioner

## 2019-07-27 LAB — IFE, DARA-SPECIFIC, SERUM
IgA: <5 mg/dL — ABNORMAL LOW (ref 64–422)
IgG (Immunoglobin G), Serum: 1063 mg/dL (ref 586–1602)
IgM (Immunoglobulin M), Srm: 8 mg/dL — ABNORMAL LOW (ref 26–217)

## 2019-07-29 ENCOUNTER — Ambulatory Visit: Payer: Medicare Other | Admitting: Nurse Practitioner

## 2019-07-29 ENCOUNTER — Inpatient Hospital Stay: Payer: Medicare Other

## 2019-07-29 ENCOUNTER — Other Ambulatory Visit: Payer: Self-pay

## 2019-07-29 ENCOUNTER — Other Ambulatory Visit: Payer: Self-pay | Admitting: Hematology

## 2019-07-29 VITALS — BP 114/68 | HR 90 | Temp 98.3°F | Resp 18

## 2019-07-29 DIAGNOSIS — G620 Drug-induced polyneuropathy: Secondary | ICD-10-CM | POA: Diagnosis not present

## 2019-07-29 DIAGNOSIS — C9000 Multiple myeloma not having achieved remission: Secondary | ICD-10-CM

## 2019-07-29 DIAGNOSIS — Z923 Personal history of irradiation: Secondary | ICD-10-CM | POA: Diagnosis not present

## 2019-07-29 DIAGNOSIS — Z9221 Personal history of antineoplastic chemotherapy: Secondary | ICD-10-CM | POA: Diagnosis not present

## 2019-07-29 DIAGNOSIS — Z7982 Long term (current) use of aspirin: Secondary | ICD-10-CM | POA: Diagnosis not present

## 2019-07-29 DIAGNOSIS — D61818 Other pancytopenia: Secondary | ICD-10-CM | POA: Diagnosis not present

## 2019-07-29 DIAGNOSIS — Z7952 Long term (current) use of systemic steroids: Secondary | ICD-10-CM | POA: Diagnosis not present

## 2019-07-29 DIAGNOSIS — I252 Old myocardial infarction: Secondary | ICD-10-CM | POA: Diagnosis not present

## 2019-07-29 DIAGNOSIS — C9002 Multiple myeloma in relapse: Secondary | ICD-10-CM

## 2019-07-29 DIAGNOSIS — M810 Age-related osteoporosis without current pathological fracture: Secondary | ICD-10-CM | POA: Diagnosis not present

## 2019-07-29 DIAGNOSIS — R739 Hyperglycemia, unspecified: Secondary | ICD-10-CM | POA: Diagnosis not present

## 2019-07-29 DIAGNOSIS — Z5112 Encounter for antineoplastic immunotherapy: Secondary | ICD-10-CM | POA: Diagnosis not present

## 2019-07-29 DIAGNOSIS — T380X5A Adverse effect of glucocorticoids and synthetic analogues, initial encounter: Secondary | ICD-10-CM | POA: Diagnosis not present

## 2019-07-29 DIAGNOSIS — Z79899 Other long term (current) drug therapy: Secondary | ICD-10-CM | POA: Diagnosis not present

## 2019-07-29 LAB — CBC WITH DIFFERENTIAL (CANCER CENTER ONLY)
Abs Immature Granulocytes: 0.04 10*3/uL (ref 0.00–0.07)
Basophils Absolute: 0 10*3/uL (ref 0.0–0.1)
Basophils Relative: 1 %
Eosinophils Absolute: 0.1 10*3/uL (ref 0.0–0.5)
Eosinophils Relative: 2 %
HCT: 29.2 % — ABNORMAL LOW (ref 36.0–46.0)
Hemoglobin: 9.8 g/dL — ABNORMAL LOW (ref 12.0–15.0)
Immature Granulocytes: 1 %
Lymphocytes Relative: 2 %
Lymphs Abs: 0.1 10*3/uL — ABNORMAL LOW (ref 0.7–4.0)
MCH: 35.4 pg — ABNORMAL HIGH (ref 26.0–34.0)
MCHC: 33.6 g/dL (ref 30.0–36.0)
MCV: 105.4 fL — ABNORMAL HIGH (ref 80.0–100.0)
Monocytes Absolute: 0.3 10*3/uL (ref 0.1–1.0)
Monocytes Relative: 9 %
Neutro Abs: 2.6 10*3/uL (ref 1.7–7.7)
Neutrophils Relative %: 85 %
Platelet Count: 80 10*3/uL — ABNORMAL LOW (ref 150–400)
RBC: 2.77 MIL/uL — ABNORMAL LOW (ref 3.87–5.11)
RDW: 18.9 % — ABNORMAL HIGH (ref 11.5–15.5)
WBC Count: 3 10*3/uL — ABNORMAL LOW (ref 4.0–10.5)
nRBC: 0 % (ref 0.0–0.2)

## 2019-07-29 LAB — CMP (CANCER CENTER ONLY)
ALT: 17 U/L (ref 0–44)
AST: 19 U/L (ref 15–41)
Albumin: 3.5 g/dL (ref 3.5–5.0)
Alkaline Phosphatase: 103 U/L (ref 38–126)
Anion gap: 11 (ref 5–15)
BUN: 17 mg/dL (ref 8–23)
CO2: 21 mmol/L — ABNORMAL LOW (ref 22–32)
Calcium: 8.4 mg/dL — ABNORMAL LOW (ref 8.9–10.3)
Chloride: 105 mmol/L (ref 98–111)
Creatinine: 0.74 mg/dL (ref 0.44–1.00)
GFR, Est AFR Am: >60 mL/min (ref >60)
GFR, Estimated: >60 mL/min (ref >60)
Glucose, Bld: 82 mg/dL (ref 70–99)
Potassium: 3.9 mmol/L (ref 3.5–5.1)
Sodium: 137 mmol/L (ref 135–145)
Total Bilirubin: 0.7 mg/dL (ref 0.3–1.2)
Total Protein: 6.3 g/dL — ABNORMAL LOW (ref 6.5–8.1)

## 2019-07-29 LAB — CK TOTAL AND CKMB (NOT AT ARMC)
CK, MB: 3.9 ng/mL (ref 0.5–5.0)
Relative Index: INVALID (ref 0.0–2.5)
Total CK: 31 U/L — ABNORMAL LOW (ref 38–234)

## 2019-07-29 MED ORDER — DEXAMETHASONE 4 MG PO TABS
ORAL_TABLET | ORAL | Status: AC
  Filled 2019-07-29: qty 5

## 2019-07-29 MED ORDER — SODIUM CHLORIDE 0.9 % IV SOLN
Freq: Once | INTRAVENOUS | Status: DC
  Filled 2019-07-29: qty 250

## 2019-07-29 MED ORDER — DEXAMETHASONE 4 MG PO TABS
20.0000 mg | ORAL_TABLET | Freq: Once | ORAL | Status: AC
  Administered 2019-07-29: 20 mg via ORAL

## 2019-07-29 MED ORDER — HEPARIN SOD (PORK) LOCK FLUSH 100 UNIT/ML IV SOLN
500.0000 [IU] | Freq: Once | INTRAVENOUS | Status: DC | PRN
  Filled 2019-07-29: qty 5

## 2019-07-29 MED ORDER — PALONOSETRON HCL INJECTION 0.25 MG/5ML
0.2500 mg | Freq: Once | INTRAVENOUS | Status: AC
  Administered 2019-07-29: 0.25 mg via INTRAVENOUS

## 2019-07-29 MED ORDER — DEXTROSE 5 % IV SOLN
69.0000 mg/m2 | Freq: Once | INTRAVENOUS | Status: AC
  Administered 2019-07-29: 110 mg via INTRAVENOUS
  Filled 2019-07-29: qty 10

## 2019-07-29 MED ORDER — SODIUM CHLORIDE 0.9% FLUSH
10.0000 mL | INTRAVENOUS | Status: DC | PRN
  Filled 2019-07-29: qty 10

## 2019-07-29 MED ORDER — SODIUM CHLORIDE 0.9 % IV SOLN
Freq: Once | INTRAVENOUS | Status: AC
  Filled 2019-07-29: qty 250

## 2019-07-29 MED ORDER — PALONOSETRON HCL INJECTION 0.25 MG/5ML
INTRAVENOUS | Status: AC
  Filled 2019-07-29: qty 5

## 2019-07-29 MED FILL — CYCLOPHOSPHAMIDE 50 MG CAPS: 50 | 28 days supply | Qty: 40 | Fill #1

## 2019-07-29 NOTE — Patient Instructions (Signed)
[facility] Discharge Instructions for Patients Receiving Chemotherapy  Today you received the following chemotherapy agents Carfilzomib (KYPROLIS).  To help prevent nausea and vomiting after your treatment, we encourage you to take your nausea medication as prescribed.   If you develop nausea and vomiting that is not controlled by your nausea medication, call the clinic.   BELOW ARE SYMPTOMS THAT SHOULD BE REPORTED IMMEDIATELY:  *FEVER GREATER THAN 100.5 F  *CHILLS WITH OR WITHOUT FEVER  NAUSEA AND VOMITING THAT IS NOT CONTROLLED WITH YOUR NAUSEA MEDICATION  *UNUSUAL SHORTNESS OF BREATH  *UNUSUAL BRUISING OR BLEEDING  TENDERNESS IN MOUTH AND THROAT WITH OR WITHOUT PRESENCE OF ULCERS  *URINARY PROBLEMS  *BOWEL PROBLEMS  UNUSUAL RASH Items with * indicate a potential emergency and should be followed up as soon as possible.  Feel free to call the clinic should you have any questions or concerns. The clinic phone number is [phone].  Please show the [facility] at check-in to the Emergency Department and triage nurse.  Coronavirus (COVID-19) Are you at risk?  Are you at risk for the Coronavirus (COVID-19)?  To be considered HIGH RISK for Coronavirus (COVID-19), you have to meet the following criteria:  . Traveled to [place], [place], [place], [place] or [place]; or in the [place] to [place], [place], [place], or [place]; and have fever, cough, and shortness of breath within the last 2 weeks of travel OR . Been in close contact with a person diagnosed with COVID-19 within the last 2 weeks and have fever, cough, and shortness of breath . IF YOU DO NOT MEET THESE CRITERIA, YOU ARE CONSIDERED LOW RISK FOR COVID-19.  What to do if you are HIGH RISK for COVID-19?  Marland Kitchen If you are having a medical emergency, call 911. . Seek medical care right away. Before you go to a doctor's office, urgent care or emergency department, call ahead and tell them  about your recent travel, contact with someone diagnosed with COVID-19, and your symptoms. You should receive instructions from your physician's office regarding next steps of care.  . When you arrive at healthcare provider, tell the healthcare staff immediately you have returned from visiting [place], [place], [place], [place] or [place]; or traveled in the [place] to [place], [place], [place], or [place]; in the last two weeks or you have been in close contact with a person diagnosed with COVID-19 in the last 2 weeks.   . Tell the health care staff about your symptoms: fever, cough and shortness of breath. . After you have been seen by a medical provider, you will be either: o Tested for (COVID-19) and discharged home on quarantine except to seek medical care if symptoms worsen, and asked to  - Stay home and avoid contact with others until you get your results (4-5 days)  - Avoid travel on public transportation if possible (such as bus, train, or airplane) or o Sent to the Emergency Department by EMS for evaluation, COVID-19 testing, and possible admission depending on your condition and test results.  What to do if you are LOW RISK for COVID-19?  Reduce your risk of any infection by using the same precautions used for avoiding the common cold or flu:  Marland Kitchen Wash your hands often with soap and warm water for at least 20 seconds.  If soap and water are not readily available, use an alcohol-based hand sanitizer with at least 60% alcohol.  . If coughing or  sneezing, cover your mouth and nose by coughing or sneezing into the elbow areas of your shirt or coat, into a tissue or into your sleeve (not your hands). . Avoid shaking hands with others and consider head nods or verbal greetings only. . Avoid touching your eyes, nose, or mouth with unwashed hands.  . Avoid close contact with people who are sick. . Avoid places or events with large numbers of people in one location, like concerts or  sporting events. . Carefully consider travel plans you have or are making. . If you are planning any travel outside or inside the [place], visit the CDC's Travelers' Health webpage for the latest health notices. . If you have some symptoms but not all symptoms, continue to monitor at home and seek medical attention if your symptoms worsen. . If you are having a medical emergency, call 911.   [place] / e-Visit: [email or web address]         [facility]: [facility]: [phone]                   [facility]: [phone]

## 2019-07-29 NOTE — Progress Notes (Signed)
Per Dr. Burr Medico, okay for patient to receive treatment with platelets 80

## 2019-07-30 NOTE — Progress Notes (Signed)
[place]   [phone] [phone]   Clinic Follow up Note   Patient Care Team: [name], MD as PCP - General (Family Medicine) [name], MD as PCP - Cardiology (Cardiology) [name], MD as Attending Physician (Obstetrics and Gynecology) [name], MD as Consulting Physician (Internal Medicine) [name], MD as Consulting Physician (Hematology) [name], MD as Consulting Physician (Ophthalmology) [name], MD as Consulting Physician (Cardiology) [name], MD as Consulting Physician (Dermatology) [name] (Dentistry) [name], DPM as Consulting Physician (Podiatry) Pleasant, Eppie Gibson, RN as [facility]  Date of Service:  08/05/2019  CHIEF COMPLAINT: F/u of MM  SUMMARY OF ONCOLOGIC HISTORY: Oncology History Overview Note  Multiple myeloma [facility])   Staging form: Multiple Myeloma, AJCC 6th Edition   - Clinical: Stage IIIA - Signed by [name], MD on 09/04/2013    Multiple myeloma ([place])  05/26/2012 Initial Diagnosis   Presenting IgG was 4,040 mg/dL on 06/05/2012 (IgA 35; IgM 34); kappa free light chain 34.4 mg/dL, lambda 0.00, kappa:lambda ratio of 34.75; SPEP with M-spike of 2.60.   06/30/2012 Imaging   Numerous lytic lesions throughout the calvarium.  Lytic lesion within the left lateral scapula.  Findings most compatible with metastases or myeloma. Multiple mid and lower thoracic compression fractures.  Slight compression through the endplates at L3.   88/41/6606 Bone Marrow Biopsy   Bone marrow biopsy showed 49% plasma cell. Normal classical cytogenetics; however, myeloma FISH panel showed 13q- (intermediate risk)       07/14/2012 - 07/27/2012 Radiation Therapy   Palliative radiation 20 Gy over 10 fractions between to thoracic spine cord compression and symptomatic left scapula lytic lesion.   08/10/2012 - 11/2012 Chemotherapy   Started SQ Velcade once weekly, 3 weeks on, 1 weeks off; daily Revlimid d1-21, 7 days off; and Dexamethasone '40mg'$  PO weekly.    02/12/2013 Bone Marrow Transplant   Auto bone marrow transplant at [name]     06/14/2013 Tumor Marker   IgG  1830,  Kappa:lamba ratio, 1.69 (baseline was 34.75 or   M-spike 0.28 (baseline of 2.6 or  89.3% of baseline).     06/25/2013 -  Chemotherapy   Started zometa 3.5 mg monthly    09/01/2013 - 01/2014 Chemotherapy   Maintenance therapy with revlimid '5mg'$  daily for 14 days and then off for 7 (decreased from 21 days on and 7 days off based on neutropenia). Stopped due to disease progression 01/2017   09/13/2013 - 09/17/2014 Hospital Admission   Hospital admission for pneumonia   12/20/2013 Treatment Plan Change   Maintenance therapy decreased to 2.5 mg daily for 21 days and then off for 7 days based on low counts.    01/25/2014 Tumor Marker   IgG 1360 M spike 0.5   03/01/2014 - 03/05/2014 Hospital Admission   [facility] center with an inferior STEMI, received thrombolytic therapy, cath showed 60% prox RCA, mid-distal RCA 50%, Echo normal, no stents.   04/11/2014 - 08/07/2014 Chemotherapy   Continue Revlimid at 2.5 mg 3 weeks on 1 week off   08/08/2014 - 08/13/2014 Hospital Admission   Hospital admission for pneumonia. Her Revlimid was held.   08/29/2014 - 09/14/2014 Chemotherapy   Maintenance Revlimid restarted   09/14/2014 - 09/17/2014 Hospital Admission   Admitted for pneumonia after coming back from a trip in [name]. Revlimid was held again.   11/13/2014 - 12/25/2015 Chemotherapy   Maintenance Revlimid restarted, changed to  2.65m daily, 2 weks on, 1 week off from 12/15/2014, stopped due to disease progression    01/24/2016 Progression   Patient is M protein has gradually increased to 1.0g, repeated a bone marrow biopsy showed plasma cell 10-30%   02/27/2016 - 03/07/2016 Chemotherapy   Pomalidomide 4 mg daily on day 1-21, dexamethasone 40 mg on day 1,  8 and 15, every 28 days, started on 02/27/2016, daratumumab weekly started on 03/07/2016, held after first dose, due to hospitalization and a severe pancytopenia, pomalidomide was stopped afterwards    03/10/2016 - 03/29/2016 Hospital Admission   Pt was admitted for sepsis from pneumonia, and severe pancytopenia. She required ICU stay for a few days due to hypotension, developed b/l pleural effusion required diuretics and b/l thoracentesis. She also required blood and plt transfusion, and prolonged neupogen injection for severe neutropenia. She was discharged home after 3 weeks hospital stay    04/26/2016 - 04/26/2019 Chemotherapy   Daratumumab restarted on 04/26/2016, Velcade 1.351mm2 and dexa 2050meekly start on 11/3, velcade held since 06/28/2016 due to CHF, dexa stopped on12/11/2017 due to high risk of fracture.  Dara reduced to every 4 weeks due to COVID-19 starting 10/23/18. Returned to every 3 weeks starting 12/17/18. Restarted every 2 weeks starting 02/22/19 Stopped on 04/26/19 due to disease progression.    08/26/2016 Echocardiogram   - Left ventricle: LVEF is approximately 35% with diffuse diffuse   hypokinesis with severe hypokinesis/ akinesis of the   inferior/inferoseptal walls. In comparison to echo images from   December 2017, there does not appear to be significant change The   cavity size was normal. Wall thickness was normal. Doppler   parameters are consistent with abnormal left ventricular   relaxation (grade 1 diastolic dysfunction). - Aortic valve: AV is thickened. 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 Present in echo from December 2017 There was   trivial regurgitation. - Right ventricle: Systolic function was mildly reduced. - Right atrium: The atrium was mildly dilated. - Pericardium, extracardiac: A trivial pericardial effusion was   identified.   12/03/2016 Echocardiogram   -Left Ventricle: Systolic  function was   mildly to moderately reduced. The estimated ejection fraction was   in the range of 40% to 45%. Diffuse hypokinesis. Doppler   parameters are consistent with abnormal left ventricular   relaxation (grade 1 diastolic dysfunction). Doppler parameters   are consistent with indeterminate ventricular filling pressure. - Left atrium: The atrium was severely dilated. - Tricuspid valve: There was trivial regurgitation. - Pulmonary arteries: Systolic pressure was within the normal   range. PA peak pressure: 33 mm Hg (S). - Global longitudinal strain -10.5% (abnormal)   03/12/2017 Echocardiogram   ECHO 03/12/17 Study Conclusions - Left ventricle: The cavity size was normal. Wall thickness was   increased in a pattern of mild LVH. Systolic function was normal.   The estimated ejection fraction was in the range of 50% to 55%.   There is hypokinesis of the basalinferior myocardium. Doppler   parameters are consistent with abnormal left ventricular   relaxation (grade 1 diastolic dysfunction). - Aortic valve: There was trivial regurgitation. - Mitral valve: Calcified annulus. Mildly thickened leaflets .   There was trivial regurgitation. - Tricuspid valve: There was mild regurgitation. Impressions: - There has been mild improvement in EF since prior study.    12/12/2017 - 12/15/2017 Hospital Admission   Admission diagnosis: Pnuemonia and acute hypoxic respiratory failure Additional comments: treated  for pneumoania and acute hypoxic respiratory failure before she was discharged on 12/15/17 with oral anitbiotics. She did not require home oxygen.    12/30/2017 - 01/02/2018 Hospital Admission   Admission diagnosis: Pnuemonia  Additional comments: She was again hospitalized on 12/30/17 for pneumonia, ID work up was otherwise negative, she was discharged home with oral antibiotics.    04/01/2018 Imaging   04/01/2018 CXR IMPRESSION: Persisting airspace disease superimposed on chronic  changes, suggesting pneumonia given the history. Followup PA and lateral chest X-ray is recommended in 3-4 weeks following therapy to ensure resolution and exclude underlying malignancy.  Redemonstration of mid and lower thoracic compression fractures with associated thoracic kyphosis.   04/23/2018 Echocardiogram   04/23/2018 ECHO LV EF: 45% -   50%   01/07/2019 Imaging   MRI Brain 01/07/19 IMPRESSION: MRI HEAD IMPRESSION:   1. 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 Lesion closely approximates the left inner ear structures and involves the left Meckel's cave and left fifth cranial nerve, and most likely is the causative etiology for patient's underlying left ear and facial symptoms. Follow-up examination with postcontrast imaging suggested for complete evaluation. Inclusion of IAC sequences would likely be helpful for visualization. 2. Additional 2.7 cm lesion involving the right occipital calvarium with adjacent marrow signal abnormality, also likely related to multiple myeloma. Metastatic disease would be the primary differential consideration. 3. Underlying age-appropriate cerebral atrophy with mild chronic microvascular ischemic disease. No other acute intracranial abnormality.   MRA HEAD IMPRESSION:   1. Negative intracranial MRA with no large vessel occlusion, hemodynamically significant stenosis, or other acute vascular abnormality. 2. Petrous and cavernous left ICA is partially encased and surrounded by the left petrous apex lesion without significant irregularity or stenosis. 3. 2 mm right paraophthalmic aneurysm.   MRA NECK IMPRESSION:   Normal MRA of the neck with wide patency of the carotid and vertebral arteries bilaterally. No hemodynamically significant stenosis or other acute vascular abnormality identified.      01/14/2019 - 01/27/2019 Radiation Therapy   Radaition to Left petrous apex bone lesion 01/14/19-01/27/19 with Dr. Tammi Klippel.    04/01/2019 Pathology Results   Bone Marrow biopsy 04/01/19  DIAGNOSIS: BONE MARROW, ASPIRATE, CLOT, CORE: - Normocellular marrow with minimal involvement by plasma cell neoplasm - See comment PERIPHERAL BLOOD: - Macrocytic anemia and leukopenia -See complete blood count   04/09/2019 PET scan   PET 04/09/19  IMPRESSION: 1. Scattered lytic lesions of bone. A few of these demonstrate hypermetabolic activity, most notably the right occipital bone lesion; the lytic lesion in the approximately C7 spinous process; the lytic expansile lesion of the left fourth rib anteriorly; and a small lytic focus in the right femoral head. Other scattered lytic lesions have only low-grade activity. 2. Multiple fractures as detailed above. 3. Other imaging findings of potential clinical significance: Aortic Atherosclerosis (ICD10-I70.0). Coronary atherosclerosis.   05/10/2019 -  Chemotherapy   Velcade q2weeeks and Dexa '20mg'$  weekly was added on 05/10/19. Switched to CyBorD (Oral cyclophosphamide CYTOXAN '500mg'$ , IV Velcade 1.'3mg'$ /m2, and oral dexa '20mg'$ , weekly) starting 05/24/19. -changed Velcade to weekly Kyprolis 3 weeks on/1 week off after 2 doses on 06/14/19 due to neuropathy.    07/05/2019 - 07/20/2019 Radiation Therapy   Radiation to right occipital lesion per Dr. Tammi Klippel 07/05/19-07/20/19.      CURRENT THERAPY:  -CyBorD (cyclophosphamide(CYTOXAN)'500mg'$  ('300mg'$ /m2),Velcade 1.'3mg'$ /m2, and dexa '20mg'$ , weekly) starting 05/24/19.Due toneuropathyafter 2 doses  of Velcade, I switched toweeklyKyprolis 3 weeks on/1 week off on 06/14/19. Chemo held for RT 07/05/19-07/22/19.   Cytoxan dose reduced to 400 mg on day 1, 8 and 15 every 28 days, from cycle 3 due to thrombocytopenia. -Restart Zometa injections every 6 weeks on 06/21/19.  INTERVAL HISTORY:  Shannon Rush is here for a follow up and  treatment. She presents to the clinic alone. She note minimal left hip pain after RT. She denies any bleeding. She notes her appetite has decreased from last year. She notes she rather not repeat Echos often unless recommended. She notes she feels Moderate hair loss from RT. She notes skin bumbs where hair is gone. She feels her hearing in right ear has improved. She plans to have COVID19 vaccine this week.    REVIEW OF SYSTEMS:   Constitutional: Denies fevers, chills or abnormal weight loss (+) Lower appetite  Eyes: Denies blurriness of vision Ears, nose, mouth, throat, and face: Denies mucositis or sore throat Respiratory: Denies cough, dyspnea or wheezes Cardiovascular: Denies palpitation, chest discomfort or lower extremity swelling Gastrointestinal:  Denies nausea, heartburn or change in bowel habits Skin: Denies abnormal skin rashes MSK: (+) Minimal left hip pain Lymphatics: Denies new lymphadenopathy or easy bruising Neurological:Denies numbness, tingling or new weaknesses Behavioral/Psych: Mood is stable, no new changes  All other systems were reviewed with the patient and are negative.  MEDICAL HISTORY:  Past Medical History:  Diagnosis Date  . CAD (coronary artery disease)    a. inf STEMI (in [place] >>> [facility]) >>> LHC (8/15- [facility]):  pRCA 60%, mid to dist RCA 50% >>> med rx  . Cardiomyopathy ([place])   . Cataract   . Chronic combined systolic and diastolic CHF (congestive heart failure) ([place])    cancer medication related   . Cord compression ([place]) 07/13/12   MRI- diffuse myeloma involvement of T-L spine  . History of radiation therapy 07/13/12-07/27/12   spinal cord compression T3-T10,left scapula  . 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 Multiple myeloma ([place]) 07/01/2012  . MVP (mitral valve prolapse)    a. trivial by echo 06/2017.  . OSTEOPOROSIS 06/11/2010   Multiple compression fractures; and spontaneous fracture of sternum Qualifier: Diagnosis of   By: [name], [name]     . Thoracic kyphosis 07/13/12   per MRI scan  . Unspecified deficiency anemia     SURGICAL HISTORY: Past Surgical History:  Procedure Laterality Date  . APPENDECTOMY    . CATARACT EXTRACTION, BILATERAL    . CESAREAN SECTION     x2   . COLONOSCOPY  2007   neg with Dr. [name]  . ELBOW SURGERY    . HIP SURGERY  2009   left  . TUBAL LIGATION      I have reviewed the social history and family history with the patient and they are unchanged from previous note.  ALLERGIES:  is allergic to zithromax [azithromycin]; zosyn [piperacillin sod-tazobactam so]; and quinolones.  MEDICATIONS:  Current Outpatient Medications  Medication Sig Dispense Refill  . acetaminophen (TYLENOL) 500 MG tablet Take 500 mg by mouth every 6 (six) hours as needed for moderate pain or fever.    Marland Kitchen acyclovir (ZOVIRAX) 400 MG tablet Take 1 tablet (400 mg total) by mouth 2 (two) times daily. 180 tablet 3  . aspirin EC 81 MG tablet Take 1 tablet (81 mg total) by mouth daily. 90 tablet 3  . atorvastatin (LIPITOR) 20 MG tablet Take 1 tablet (  20 mg total) by mouth daily. 10 tablet 0  . Calcium Carbonate-Vit D-Min (CALCIUM 600+D PLUS MINERALS) 600-400 MG-UNIT TABS Take by mouth.    . calcium-vitamin D (OSCAL WITH D) 500-200 MG-UNIT TABS tablet Take by mouth.    . cyclophosphamide (CYTOXAN) 50 MG capsule Take 10 capsules (500 mg total) by mouth once a week. Take 300 mg/m2 by mouth with breakfast once weekly. Take with food to minimize GI upset. Take early in the day and maintain hydration. (Patient taking differently: Take 500 mg/m2 by mouth once a week. Take 300 mg/m2 by mouth with breakfast once weekly. Take with food to minimize GI upset. Take early in the day and maintain hydration.) 40 capsule 3  . dexamethasone (DECADRON) 4 MG tablet Take 5 tablets (20 mg total) by mouth once a week. Take on the day of chemo 20 tablet 3  . doxycycline (VIBRA-TABS) 100 MG tablet Take 100 mg by mouth as needed.      . metoprolol succinate (TOPROL-XL) 25 MG 24 hr tablet Take 1 tablet (25 mg total) by mouth daily. 15 tablet 0  . ondansetron (ZOFRAN) 8 MG tablet Take 1 tablet (8 mg total) by mouth every 8 (eight) hours as needed for nausea or vomiting. 20 tablet 3  . prochlorperazine (COMPAZINE) 10 MG tablet Take 1 tablet (10 mg total) by mouth every 6 (six) hours as needed (Nausea or vomiting). 30 tablet 1   No current facility-administered medications for this visit.    PHYSICAL EXAMINATION: ECOG PERFORMANCE STATUS: 2 - Symptomatic, <50% confined to bed  Vitals:   08/05/19 1312  BP: 116/72  Pulse: 84  Resp: 18  Temp: 98.2 F (36.8 C)  SpO2: 100%   Filed Weights   08/05/19 1312  Weight: 112 lb 11.2 oz (51.1 kg)    Due to COVID19 we will limit examination to appearance. Patient had no complaints.  GENERAL:alert, no distress and comfortable SKIN: skin color normal, no rashes or significant lesions EYES: normal, Conjunctiva are pink and non-injected, sclera clear  NEURO: alert & oriented x 3 with fluent speech   LABORATORY DATA:  I have reviewed the data as listed CBC Latest Ref Rng & Units 08/05/2019 07/29/2019 07/22/2019  WBC 4.0 - 10.5 K/uL 2.5(L) 3.0(L) 4.3  Hemoglobin 12.0 - 15.0 g/dL 9.6(L) 9.8(L) 10.5(L)  Hematocrit 36.0 - 46.0 % 27.7(L) 29.2(L) 30.9(L)  Platelets 150 - 400 K/uL 85(L) 80(L) 163     CMP Latest Ref Rng & Units 08/05/2019 07/29/2019 07/22/2019  Glucose 70 - 99 mg/dL 87 82 93  BUN 8 - 23 mg/dL '21 17 16  '$ Creatinine 0.44 - 1.00 mg/dL 0.72 0.74 0.73  Sodium 135 - 145 mmol/L 139 137 137  Potassium 3.5 - 5.1 mmol/L 4.0 3.9 4.1  Chloride 98 - 111 mmol/L 106 105 105  CO2 22 - 32 mmol/L 22 21(L) 22  Calcium 8.9 - 10.3 mg/dL 8.6(L) 8.4(L) 8.6(L)  Total Protein 6.5 - 8.1 g/dL 6.2(L) 6.3(L) 6.6  Total Bilirubin 0.3 - 1.2 mg/dL 0.7 0.7 0.6  Alkaline Phos 38 - 126 U/L 95 103 121  AST 15 - 41 U/L '23 19 17  '$ ALT 0 - 44 U/L '23 17 16      '$ RADIOGRAPHIC STUDIES: I have personally  reviewed the radiological images as listed and agreed with the findings in the report. No results found.   ASSESSMENT & PLAN:  Shannon Rush is a 79 y.o. female with     1. IgG kappa Multiple myeloma  s/p Auto SCT, intermediate risk, relapsed in 7/2017and progressed again in Oct 2020 -Diagnosed in 05/2012. Treated with induction chemo, radiation and BM transplant, but unfortunately progressed while on maintenance Revlimid 01/2016. She is currently onDaratumumabinfusion monotherapy.  -PETimagesfrom 04/09/19 showedScattered lytic lesions of bone. A few of these demonstrate hypermetabolic activity, most notably the right occipital bone lesion, C7 spinous process, Left 4th rib, andright femoral head.  -Herbone marrow biopsy from 04/02/19 which showsnormocellular marrow with minimal involvement by plasma cell neoplasm(2%). -unfortunately she has progressed recently with increased M-protein andmore symptomatic bone pain from MM, s/p palliative radiation to left hip and right scalp  -Based on Dr. Noah Delaine recommendation I changedher treatment to CyBorD (oral cyclophosphamide '500mg'$ , Velcade 1.'3mg'$ /m2, and dexa '20mg'$ , weekly) started on11/9/20.After 2 doses of Velcade, I switched toweeklyKyprolis3 weeks on/1 week offstarting 06/14/19 due to neuropathy.Chemo was held for recent RT.  -She proceeded with oral Cytoxan last week and was fatigued with  minimal nausea. Labs reviewed, Wbc 2.5, Hg 9.6, PLT 85K. Will hold Cytoxan this week to allow time for blood counts to recover. Will proceed with C2D15 IV Kyprolis today.  -since she started current chemo treatment her M Protein is trending down, evidence of clinical response. Will continue treatment.  -Starting with C3 will reduce Cytoxan dose to '400mg'$ . She will take Cytoxan and Dexa in our clinic in future  -I offered her the option of a PAC placement, she declined.  -I encouraged her to monitor heart function with Echo every 3-6 months.  -She  will proceed with COVID19 vaccine this week.    2. New left hip pain  -she had left hip surgery with rod in 2009  -She developed new left hip pain on weightbearing, no painat resting  -She now only has minimal left hip pain. She no longer needs walker and is back to biking.   3. Left petrous apex bone lesion with hearing loss and Right OccipitalskullLesion  -Her MRI brain from 01/07/19 showedabnormal osseous lesion measuring up to approximately 5.9 cm centered at the left petrous apex. Thismost likely is the cause of her underlyingleft ear and balancesymptoms. -Given she is symptomatic, sheunderwent target RT with Dr Tammi Klippel 7/1-7/15. This is negative on9/25/20PET scan -Symptoms persist, she has stable muffled hearing and mild imbalanceissue.Stable -Her PET from 04/09/19 shows lesion with hypermetabolic activity in this area concerning for MM progression here. -She recently completed RT to right occipital lesion per Dr. Tammi Klippel 07/05/19-07/20/19. Tolerated well. She has moderate hair loss at RT site.   4. History of multiple pneumonia andbronchitis -She has had multiple episodes of pneumonia, required hospitalization,and bronchitis, required antibiotics -She has recovered well, will continue monitoring. I encouraged her to do spirometry,and exercise. -Her vaccines are up-to-date.  5. Pancytopenia -She mainly has anemia and Thrombocytopenia concerningwhich occurred with recent(03/2019) disease progression. -Did give bloodtransfusionon 06/04/19  -Has recurred with oral Cytoxan. I encouraged her to avoid injury or fall given thrombocytopenia   6. CHF, CAD, inferior STEMI in 02/2014, HLD -She is on ASA, BB, statin. She will continue to f/u with Dr. Meda Coffee -BPwell controlled and normal lately. -10/2020echo showed normal EF, will continue monitoring   7. Osteoporosis with multiple fractures -Last DEXA in 2015 showed worsening osteoporosis. She declined repeat  DEXA.  -She had a fall induced fracture in 01/2018. -She was on Zometa for 4 years, stopped after April 2018  -I restarted her Zometa to help strengthen her bone on 06/21/19. Will monitor her dental health  8. Peripheral neuropathy in feet, G2 -  Secondary to previous chemo. -She previously declined Gabapentin -Worse lately since she started Velcade, will change Velcade to Kyprolis which cause less neuropathyon 06/14/19.  9. Goal of care discussion  -she is full code now  10 Steroid induced hyperglycemia -Secondary to steroids,her random blood glucose was 217 on 06/07/19. Has much improved recently.  -I encouraged her to drink plenty of water and reduce sugar and carbohydrate in diet.  -06/21/19 A1c at 6.3.    Plan -Labs reviewed and adequate to proceed with C2D15 Kyprolis today and day 8 and 15 every 28 days.  -Continue Dexa '20mg'$  and hold Cytoxantoday due to her thrombocytopenia  -Lab and Kyprolis in 2, 3 and 4 weeks, will reduce her oral Cytoxan to 400 mg on day 1, 8 and 15 from next cycle  -F/u in 2 week    No problem-specific Assessment & Plan notes found for this encounter.   No orders of the defined types were placed in this encounter.  All questions were answered. The patient knows to call the clinic with any problems, questions or concerns. No barriers to learning was detected. The total time spent in the appointment was 30 minutes.     [name], MD 08/05/2019   I, Joslyn Devon, am acting as scribe for [name], MD.   I have reviewed the above documentation for accuracy and completeness, and I agree with the above.

## 2019-08-04 ENCOUNTER — Other Ambulatory Visit: Payer: Self-pay | Admitting: Hematology

## 2019-08-04 DIAGNOSIS — C9002 Multiple myeloma in relapse: Secondary | ICD-10-CM

## 2019-08-05 ENCOUNTER — Other Ambulatory Visit: Payer: Self-pay

## 2019-08-05 ENCOUNTER — Inpatient Hospital Stay: Payer: Medicare Other

## 2019-08-05 ENCOUNTER — Encounter: Payer: Self-pay | Admitting: Hematology

## 2019-08-05 ENCOUNTER — Inpatient Hospital Stay (HOSPITAL_BASED_OUTPATIENT_CLINIC_OR_DEPARTMENT_OTHER): Payer: Medicare Other | Admitting: Hematology

## 2019-08-05 VITALS — BP 116/72 | HR 84 | Temp 98.2°F | Resp 18 | Ht 67.0 in | Wt 112.7 lb

## 2019-08-05 DIAGNOSIS — C9000 Multiple myeloma not having achieved remission: Secondary | ICD-10-CM

## 2019-08-05 DIAGNOSIS — M818 Other osteoporosis without current pathological fracture: Secondary | ICD-10-CM

## 2019-08-05 DIAGNOSIS — Z9221 Personal history of antineoplastic chemotherapy: Secondary | ICD-10-CM | POA: Diagnosis not present

## 2019-08-05 DIAGNOSIS — D6481 Anemia due to antineoplastic chemotherapy: Secondary | ICD-10-CM

## 2019-08-05 DIAGNOSIS — T451X5A Adverse effect of antineoplastic and immunosuppressive drugs, initial encounter: Secondary | ICD-10-CM

## 2019-08-05 DIAGNOSIS — M810 Age-related osteoporosis without current pathological fracture: Secondary | ICD-10-CM | POA: Diagnosis not present

## 2019-08-05 DIAGNOSIS — Z79899 Other long term (current) drug therapy: Secondary | ICD-10-CM | POA: Diagnosis not present

## 2019-08-05 DIAGNOSIS — Z5112 Encounter for antineoplastic immunotherapy: Secondary | ICD-10-CM | POA: Diagnosis not present

## 2019-08-05 DIAGNOSIS — Z7952 Long term (current) use of systemic steroids: Secondary | ICD-10-CM | POA: Diagnosis not present

## 2019-08-05 DIAGNOSIS — C9002 Multiple myeloma in relapse: Secondary | ICD-10-CM

## 2019-08-05 DIAGNOSIS — Z923 Personal history of irradiation: Secondary | ICD-10-CM | POA: Diagnosis not present

## 2019-08-05 DIAGNOSIS — R739 Hyperglycemia, unspecified: Secondary | ICD-10-CM | POA: Diagnosis not present

## 2019-08-05 DIAGNOSIS — I252 Old myocardial infarction: Secondary | ICD-10-CM | POA: Diagnosis not present

## 2019-08-05 DIAGNOSIS — G620 Drug-induced polyneuropathy: Secondary | ICD-10-CM | POA: Diagnosis not present

## 2019-08-05 DIAGNOSIS — Z7982 Long term (current) use of aspirin: Secondary | ICD-10-CM | POA: Diagnosis not present

## 2019-08-05 DIAGNOSIS — Z95828 Presence of other vascular implants and grafts: Secondary | ICD-10-CM

## 2019-08-05 DIAGNOSIS — D61818 Other pancytopenia: Secondary | ICD-10-CM | POA: Diagnosis not present

## 2019-08-05 DIAGNOSIS — T380X5A Adverse effect of glucocorticoids and synthetic analogues, initial encounter: Secondary | ICD-10-CM | POA: Diagnosis not present

## 2019-08-05 LAB — CBC WITH DIFFERENTIAL (CANCER CENTER ONLY)
Abs Immature Granulocytes: 0.04 10*3/uL (ref 0.00–0.07)
Basophils Absolute: 0 10*3/uL (ref 0.0–0.1)
Basophils Relative: 1 %
Eosinophils Absolute: 0.1 10*3/uL (ref 0.0–0.5)
Eosinophils Relative: 3 %
HCT: 27.7 % — ABNORMAL LOW (ref 36.0–46.0)
Hemoglobin: 9.6 g/dL — ABNORMAL LOW (ref 12.0–15.0)
Immature Granulocytes: 2 %
Lymphocytes Relative: 5 %
Lymphs Abs: 0.1 10*3/uL — ABNORMAL LOW (ref 0.7–4.0)
MCH: 36.5 pg — ABNORMAL HIGH (ref 26.0–34.0)
MCHC: 34.7 g/dL (ref 30.0–36.0)
MCV: 105.3 fL — ABNORMAL HIGH (ref 80.0–100.0)
Monocytes Absolute: 0.4 10*3/uL (ref 0.1–1.0)
Monocytes Relative: 15 %
Neutro Abs: 1.9 10*3/uL (ref 1.7–7.7)
Neutrophils Relative %: 74 %
Platelet Count: 85 10*3/uL — ABNORMAL LOW (ref 150–400)
RBC: 2.63 MIL/uL — ABNORMAL LOW (ref 3.87–5.11)
RDW: 19.1 % — ABNORMAL HIGH (ref 11.5–15.5)
WBC Count: 2.5 10*3/uL — ABNORMAL LOW (ref 4.0–10.5)
nRBC: 0 % (ref 0.0–0.2)

## 2019-08-05 LAB — CMP (CANCER CENTER ONLY)
ALT: 23 U/L (ref 0–44)
AST: 23 U/L (ref 15–41)
Albumin: 3.6 g/dL (ref 3.5–5.0)
Alkaline Phosphatase: 95 U/L (ref 38–126)
Anion gap: 11 (ref 5–15)
BUN: 21 mg/dL (ref 8–23)
CO2: 22 mmol/L (ref 22–32)
Calcium: 8.6 mg/dL — ABNORMAL LOW (ref 8.9–10.3)
Chloride: 106 mmol/L (ref 98–111)
Creatinine: 0.72 mg/dL (ref 0.44–1.00)
GFR, Est AFR Am: >60 mL/min (ref >60)
GFR, Estimated: >60 mL/min (ref >60)
Glucose, Bld: 87 mg/dL (ref 70–99)
Potassium: 4 mmol/L (ref 3.5–5.1)
Sodium: 139 mmol/L (ref 135–145)
Total Bilirubin: 0.7 mg/dL (ref 0.3–1.2)
Total Protein: 6.2 g/dL — ABNORMAL LOW (ref 6.5–8.1)

## 2019-08-05 MED ORDER — DEXAMETHASONE 4 MG PO TABS
ORAL_TABLET | ORAL | Status: AC
  Filled 2019-08-05: qty 5

## 2019-08-05 MED ORDER — DEXTROSE 5 % IV SOLN
69.0000 mg/m2 | Freq: Once | INTRAVENOUS | Status: AC
  Administered 2019-08-05: 16:00:00 110 mg via INTRAVENOUS
  Filled 2019-08-05: qty 30

## 2019-08-05 MED ORDER — SODIUM CHLORIDE 0.9 % IV SOLN
Freq: Once | INTRAVENOUS | Status: DC
  Filled 2019-08-05: qty 250

## 2019-08-05 MED ORDER — PALONOSETRON HCL INJECTION 0.25 MG/5ML
INTRAVENOUS | Status: AC
  Filled 2019-08-05: qty 5

## 2019-08-05 MED ORDER — SODIUM CHLORIDE 0.9 % IV SOLN
Freq: Once | INTRAVENOUS | Status: AC
  Filled 2019-08-05: qty 250

## 2019-08-05 MED ORDER — PALONOSETRON HCL INJECTION 0.25 MG/5ML
0.2500 mg | Freq: Once | INTRAVENOUS | Status: AC
  Administered 2019-08-05: 15:00:00 0.25 mg via INTRAVENOUS

## 2019-08-05 MED ORDER — DEXAMETHASONE 4 MG PO TABS
20.0000 mg | ORAL_TABLET | Freq: Once | ORAL | Status: AC
  Administered 2019-08-05: 15:00:00 20 mg via ORAL

## 2019-08-05 MED ORDER — ZOLEDRONIC ACID 4 MG/5ML IV CONC
3.5000 mg | Freq: Once | INTRAVENOUS | Status: AC
  Administered 2019-08-05: 3.5 mg via INTRAVENOUS
  Filled 2019-08-05: qty 4.38

## 2019-08-05 NOTE — Patient Instructions (Signed)
[facility] Discharge Instructions for Patients Receiving Chemotherapy  Today you received the following chemotherapy agents Carfilzomib (KYPROLIS).  To help prevent nausea and vomiting after your treatment, we encourage you to take your nausea medication as prescribed.  If you develop nausea and vomiting that is not controlled by your nausea medication, call the clinic.   BELOW ARE SYMPTOMS THAT SHOULD BE REPORTED IMMEDIATELY:  *FEVER GREATER THAN 100.5 F  *CHILLS WITH OR WITHOUT FEVER  NAUSEA AND VOMITING THAT IS NOT CONTROLLED WITH YOUR NAUSEA MEDICATION  *UNUSUAL SHORTNESS OF BREATH  *UNUSUAL BRUISING OR BLEEDING  TENDERNESS IN MOUTH AND THROAT WITH OR WITHOUT PRESENCE OF ULCERS  *URINARY PROBLEMS  *BOWEL PROBLEMS  UNUSUAL RASH Items with * indicate a potential emergency and should be followed up as soon as possible.  Feel free to call the clinic should you have any questions or concerns. The clinic phone number is [phone].  Please show the CHEMO ALERT CARD at check-in to the Emergency Department and triage nurse.   

## 2019-08-05 NOTE — Progress Notes (Signed)
Per Dr. Burr Medico okay to treat with platelets 80, kyprolis only, no cytoxan.

## 2019-08-06 ENCOUNTER — Telehealth: Payer: Self-pay | Admitting: Hematology

## 2019-08-06 ENCOUNTER — Ambulatory Visit: Payer: Medicare Other | Attending: Internal Medicine

## 2019-08-06 DIAGNOSIS — Z23 Encounter for immunization: Secondary | ICD-10-CM | POA: Insufficient documentation

## 2019-08-06 NOTE — Progress Notes (Signed)
   [facility]  Name:  ZEILA PERTEET    MRN: [id number] DOB: 1941/05/05  08/06/2019  Ms. Florek was observed post Covid-19 immunization for 15 minutes without incidence. She was provided with Vaccine Information Sheet and instruction to access the V-Safe system.   Ms. Schau was instructed to call 911 with any severe reactions post vaccine: Marland Kitchen Difficulty breathing  . Swelling of your face and throat  . A fast heartbeat  . A bad rash all over your body  . Dizziness and weakness    Immunizations Administered    Name Date Dose VIS Date Route   Pfizer COVID-19 Vaccine 08/06/2019  8:59 AM 0.3 mL 06/25/2019 Intramuscular   Manufacturer: [id number]   Lot: [id number]   [place]: [id number]

## 2019-08-06 NOTE — Telephone Encounter (Signed)
Scheduled appt per 1/21 los.  Was not able to reach pt, but patient will get a print out at their next scheduled appt.

## 2019-08-09 LAB — MULTIPLE MYELOMA PANEL, SERUM
Albumin SerPl Elph-Mcnc: 3.4 g/dL (ref 2.9–4.4)
Albumin/Glob SerPl: 1.5 (ref 0.7–1.7)
Alpha 1: 0.3 g/dL (ref 0.0–0.4)
Alpha2 Glob SerPl Elph-Mcnc: 0.6 g/dL (ref 0.4–1.0)
B-Globulin SerPl Elph-Mcnc: 0.9 g/dL (ref 0.7–1.3)
Gamma Glob SerPl Elph-Mcnc: 0.6 g/dL (ref 0.4–1.8)
Globulin, Total: 2.3 g/dL (ref 2.2–3.9)
IgA: <5 mg/dL — ABNORMAL LOW (ref 64–422)
IgG (Immunoglobin G), Serum: 746 mg/dL (ref 586–1602)
IgM (Immunoglobulin M), Srm: 7 mg/dL — ABNORMAL LOW (ref 26–217)
M Protein SerPl Elph-Mcnc: 0.4 g/dL — ABNORMAL HIGH
Total Protein ELP: 5.7 g/dL — ABNORMAL LOW (ref 6.0–8.5)

## 2019-08-13 NOTE — Progress Notes (Signed)
[place]   [phone] [phone]   Clinic Follow up Note   Patient Care Team: [name], MD as PCP - General (Family Medicine) [name], MD as PCP - Cardiology (Cardiology) [name], MD as Attending Physician (Obstetrics and Gynecology) [name], MD as Consulting Physician (Internal Medicine) [name], MD as Consulting Physician (Hematology) [name], MD as Consulting Physician (Ophthalmology) [name], MD as Consulting Physician (Cardiology) [name], MD as Consulting Physician (Dermatology) [name] (Dentistry) [name], DPM as Consulting Physician (Podiatry) Pleasant, Eppie Gibson, RN as Br[facility] Management  Date of Service:  08/19/2019  CHIEF COMPLAINT: F/u of MM  SUMMARY OF ONCOLOGIC HISTORY: Oncology History Overview Note  Multiple myeloma [facility])   Staging form: Multiple Myeloma, AJCC 6th Edition   - Clinical: Stage IIIA - Signed by [name], MD on 09/04/2013    Multiple myeloma ([place])  05/26/2012 Initial Diagnosis   Presenting IgG was 4,040 mg/dL on 06/05/2012 (IgA 35; IgM 34); kappa free light chain 34.4 mg/dL, lambda 0.00, kappa:lambda ratio of 34.75; SPEP with M-spike of 2.60.   06/30/2012 Imaging   Numerous lytic lesions throughout the calvarium.  Lytic lesion within the left lateral scapula.  Findings most compatible with metastases or myeloma. Multiple mid and lower thoracic compression fractures.  Slight compression through the endplates at L3.   93/90/3009 Bone Marrow Biopsy   Bone marrow biopsy showed 49% plasma cell. Normal classical cytogenetics; however, myeloma FISH panel showed 13q- (intermediate risk)       07/14/2012 - 07/27/2012 Radiation Therapy   Palliative radiation 20 Gy over 10 fractions between to thoracic spine cord compression and symptomatic left scapula lytic lesion.   08/10/2012 - 11/2012 Chemotherapy   Started SQ Velcade once weekly, 3 weeks on, 1 weeks off; daily Revlimid d1-21, 7 days off; and Dexamethasone 64m PO weekly.    02/12/2013 Bone Marrow Transplant   [facility]    06/14/2013 Tumor Marker   IgG  1830,  Kappa:lamba ratio, 1.69 (baseline was 34.75 or   M-spike 0.28 (baseline of 2.6 or  89.3% of baseline).     06/25/2013 -  Chemotherapy   Started zometa 3.5 mg monthly    09/01/2013 - 01/2014 Chemotherapy   Maintenance therapy with revlimid 52mdaily for 14 days and then off for 7 (decreased from 21 days on and 7 days off based on neutropenia). Stopped due to disease progression 01/2017   09/13/2013 - 09/17/2014 Hospital Admission   Hospital admission for pneumonia   12/20/2013 Treatment Plan Change   Maintenance therapy decreased to 2.5 mg daily for 21 days and then off for 7 days based on low counts.    01/25/2014 Tumor Marker   IgG 1360 M spike 0.5   03/01/2014 - 03/05/2014 Hospital Admission   [facility], received thrombolytic therapy, cath showed 60% prox RCA, mid-distal RCA 50%, Echo normal, no stents.   04/11/2014 - 08/07/2014 Chemotherapy   Continue Revlimid at 2.5 mg 3 weeks on 1 week off   08/08/2014 - 08/13/2014 Hospital Admission   Hospital admission for pneumonia. Her Revlimid was held.   08/29/2014 - 09/14/2014 Chemotherapy   Maintenance Revlimid restarted   09/14/2014 - 09/17/2014 Hospital Admission   [place].   11/13/2014 - 12/25/2015 Chemotherapy   Maintenance Revlimid restarted, changed to  2.65m daily, 2 weks on, 1 week off from 12/15/2014, stopped due to disease progression    01/24/2016 Progression   Patient is M protein has gradually increased to 1.0g, repeated a bone marrow biopsy showed plasma cell 10-30%   02/27/2016 - 03/07/2016 Chemotherapy   Pomalidomide 4 mg daily on day 1-21, dexamethasone 40 mg on day 1,  8 and 15, every 28 days, started on 02/27/2016, daratumumab weekly started on 03/07/2016, held after first dose, due to hospitalization and a severe pancytopenia, pomalidomide was stopped afterwards    03/10/2016 - 03/29/2016 Hospital Admission   Pt was admitted for sepsis from pneumonia, and severe pancytopenia. She required ICU stay for a few days due to hypotension, developed b/l pleural effusion required diuretics and b/l thoracentesis. She also required blood and plt transfusion, and prolonged neupogen injection for severe neutropenia. She was discharged home after 3 weeks hospital stay    04/26/2016 - 04/26/2019 Chemotherapy   Daratumumab restarted on 04/26/2016, Velcade 1.351mm2 and dexa 2050meekly start on 11/3, velcade held since 06/28/2016 due to CHF, dexa stopped on12/11/2017 due to high risk of fracture.  Dara reduced to every 4 weeks due to COVID-19 starting 10/23/18. Returned to every 3 weeks starting 12/17/18. Restarted every 2 weeks starting 02/22/19 Stopped on 04/26/19 due to disease progression.    08/26/2016 Echocardiogram   - Left ventricle: LVEF is approximately 35% with diffuse diffuse   hypokinesis with severe hypokinesis/ akinesis of the   inferior/inferoseptal walls. In comparison to echo images from   December 2017, there does not appear to be significant change The   cavity size was normal. Wall thickness was normal. Doppler   parameters are consistent with abnormal left ventricular   relaxation (grade 1 diastolic dysfunction). - Aortic valve: AV is thickened. 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 Present in echo from December 2017 There was   trivial regurgitation. - Right ventricle: Systolic function was mildly reduced. - Right atrium: The atrium was mildly dilated. - Pericardium, extracardiac: A trivial pericardial effusion was   identified.   12/03/2016 Echocardiogram   -Left Ventricle: Systolic  function was   mildly to moderately reduced. The estimated ejection fraction was   in the range of 40% to 45%. Diffuse hypokinesis. Doppler   parameters are consistent with abnormal left ventricular   relaxation (grade 1 diastolic dysfunction). Doppler parameters   are consistent with indeterminate ventricular filling pressure. - Left atrium: The atrium was severely dilated. - Tricuspid valve: There was trivial regurgitation. - Pulmonary arteries: Systolic pressure was within the normal   range. PA peak pressure: 33 mm Hg (S). - Global longitudinal strain -10.5% (abnormal)   03/12/2017 Echocardiogram   ECHO 03/12/17 Study Conclusions - Left ventricle: The cavity size was normal. Wall thickness was   increased in a pattern of mild LVH. Systolic function was normal.   The estimated ejection fraction was in the range of 50% to 55%.   There is hypokinesis of the basalinferior myocardium. Doppler   parameters are consistent with abnormal left ventricular   relaxation (grade 1 diastolic dysfunction). - Aortic valve: There was trivial regurgitation. - Mitral valve: Calcified annulus. Mildly thickened leaflets .   There was trivial regurgitation. - Tricuspid valve: There was mild regurgitation. Impressions: - There has been mild improvement in EF since prior study.    12/12/2017 - 12/15/2017 Hospital Admission   Admission diagnosis: Pnuemonia and acute hypoxic respiratory failure Additional comments: treated  for pneumoania and acute hypoxic respiratory failure before she was discharged on 12/15/17 with oral anitbiotics. She did not require home oxygen.    12/30/2017 - 01/02/2018 Hospital Admission   Admission diagnosis: Pnuemonia  Additional comments: She was again hospitalized on 12/30/17 for pneumonia, ID work up was otherwise negative, she was discharged home with oral antibiotics.    04/01/2018 Imaging   04/01/2018 CXR IMPRESSION: Persisting airspace disease superimposed on chronic  changes, suggesting pneumonia given the history. Followup PA and lateral chest X-ray is recommended in 3-4 weeks following therapy to ensure resolution and exclude underlying malignancy.  Redemonstration of mid and lower thoracic compression fractures with associated thoracic kyphosis.   04/23/2018 Echocardiogram   04/23/2018 ECHO LV EF: 45% -   50%   01/07/2019 Imaging   MRI Brain 01/07/19 IMPRESSION: MRI HEAD IMPRESSION:   1. 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 Lesion closely approximates the left inner ear structures and involves the left Meckel's cave and left fifth cranial nerve, and most likely is the causative etiology for patient's underlying left ear and facial symptoms. Follow-up examination with postcontrast imaging suggested for complete evaluation. Inclusion of IAC sequences would likely be helpful for visualization. 2. Additional 2.7 cm lesion involving the right occipital calvarium with adjacent marrow signal abnormality, also likely related to multiple myeloma. Metastatic disease would be the primary differential consideration. 3. Underlying age-appropriate cerebral atrophy with mild chronic microvascular ischemic disease. No other acute intracranial abnormality.   MRA HEAD IMPRESSION:   1. Negative intracranial MRA with no large vessel occlusion, hemodynamically significant stenosis, or other acute vascular abnormality. 2. Petrous and cavernous left ICA is partially encased and surrounded by the left petrous apex lesion without significant irregularity or stenosis. 3. 2 mm right paraophthalmic aneurysm.   MRA NECK IMPRESSION:   Normal MRA of the neck with wide patency of the carotid and vertebral arteries bilaterally. No hemodynamically significant stenosis or other acute vascular abnormality identified.     01/14/2019 - 01/27/2019 Radiation Therapy   By Dr. Tammi Klippel.    Diagnosis:   79 yo woman with skull base myeloma lesion causing left ear pain      Indication for treatment:  Palliative        Radiation treatment dates:   01/14/2019 - 01/27/2019   Site/dose:   Skull Base / 20 Gy in 10 fractions   Beams/energy:   3D, photons / 10X, 6X   04/01/2019 Pathology Results   Bone Marrow biopsy 04/01/19  DIAGNOSIS: BONE MARROW, ASPIRATE, CLOT, CORE: - Normocellular marrow with minimal involvement by plasma cell neoplasm - See comment PERIPHERAL BLOOD: - Macrocytic anemia and leukopenia -See complete blood count   04/09/2019 PET scan   PET 04/09/19  IMPRESSION: 1. Scattered lytic lesions of bone. A few of these demonstrate hypermetabolic activity, most notably the right occipital bone lesion; the lytic lesion in the approximately C7 spinous process; the lytic expansile lesion of the left fourth rib anteriorly; and a small lytic focus in the right femoral head. Other scattered lytic lesions have only low-grade activity. 2. Multiple fractures as detailed above. 3. Other imaging findings of potential clinical significance: Aortic Atherosclerosis (ICD10-I70.0). Coronary atherosclerosis.   05/10/2019 -  Chemotherapy   -CyBorD (cyclophosphamide (CYTOXAN) 546m (3074mm2), Velcade 1.56m2m2, and dexa 40m67meekly) starting 05/24/19.   -Due to neuropathy after 2 doses of Velcade, I switched to weekly Kyprolis 3  weeks on/1 week off on 06/14/19.   -Chemo held for RT 07/05/19-07/22/19.   -Dexa stopped and Cytoxan dose reduced to 400 mg on day 1, 8 and 15 every 28 days, from cycle 3.     07/05/2019 - 07/20/2019 Radiation Therapy   By Dr. Tammi Klippel   Diagnosis:   79 yo woman with painful right occipital and left hip involvement by multiple myeloma      Indication for treatment:  Palliation        Radiation treatment dates:   07/05/19-07/20/19   Site/dose:    1.  The Right Occipital skull lesion was treated  to 20 Gy in 20 fractions of 2 Gy 2.  The Left Hip lesion was treated to 20 Gy in 20 fractions of 2 Gy   Beams/energy:      1.  The Right Occipital skull lesion was treated using a 3-field 3D set-up 2.  The Left Hip lesion was treated anterior and posterior fields.      CURRENT THERAPY:  -CyBorD (cyclophosphamide(CYTOXAN)5110m (30109mm2),Velcade 1.62m27m2, and dexa 59m85meekly) starting 05/24/19.  -Due toneuropathyafter 2 doses of Velcade, I switched toweeklyKyprolis 3 weeks on/1 week off on 06/14/19.   -Chemo held for RT 07/05/19-07/22/19.  -Dexa reduced to 59mg58m Cytoxan dose reduced to 400 mg on day 1, 8 and 15 every 28 days, from cycle 3.  -Restart Zometa injections every 6 weeks on 06/21/19.  INTERVAL HISTORY:  ClairMINDEE ROBLEDOere for a follow up and treatment. She presents to the clinic alone. She notes she is doing well. She notes her left hip pain is about 1% and she rarely notices it. She still feels fullness of her ears. She notes more hair loss s/p RT. She notes her energy improves on her week off treatment.     REVIEW OF SYSTEMS:   Constitutional: Denies fevers, chills or abnormal weight loss Eyes: Denies blurriness of vision Ears, nose, mouth, throat, and face: Denies mucositis or sore throat Respiratory: Denies cough, dyspnea or wheezes Cardiovascular: Denies palpitation, chest discomfort or lower extremity swelling Gastrointestinal:  Denies nausea, heartburn or change in bowel habits Skin: Denies abnormal skin rashes (+) More hair loss  MSK: (+) nearly resolved left hip pain.  Lymphatics: Denies new lymphadenopathy or easy bruising Neurological:Denies numbness, tingling or new weaknesses Behavioral/Psych: Mood is stable, no new changes  All other systems were reviewed with the patient and are negative.  MEDICAL HISTORY:  Past Medical History:  Diagnosis Date  . CAD (coronary artery disease)    [place] mid to dist RCA 50% >>> med rx  . Cardiomyopathy (HCC) [place] [facility]   . Chronic combined systolic and diastolic CHF (congestive heart failure) (HCC) [place]   . Cord compression (HCC) [place]30/13   MRI- diffuse myeloma involvement of T-L spine  . History of radiation therapy 07/13/12-07/27/12   spinal cord compression T3-T10,left scapula  . 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 Multiple myeloma (HCC) [place]18/2013  . MVP (mitral valve prolapse)    a. trivial by echo 06/2017.  . OSTEOPOROSIS 06/11/2010   Multiple compression fractures; and spontaneous fracture of sternum Qualifier: Diagnosis of  By: SaxonZebedee IbaSusanManuela Schwartz. Thoracic kyphosis 07/13/12   per MRI scan  . Unspecified deficiency anemia     SURGICAL HISTORY: Past Surgical History:  Procedure Laterality Date  .  APPENDECTOMY    . CATARACT EXTRACTION, BILATERAL    . CESAREAN SECTION     x2   . COLONOSCOPY  2007   neg with Dr. [name]  . ELBOW SURGERY    . HIP SURGERY  2009   left  . TUBAL LIGATION      I have reviewed the social history and family history with the patient and they are unchanged from previous note.  ALLERGIES:  is allergic to zithromax [azithromycin]; zosyn [piperacillin sod-tazobactam so]; and quinolones.  MEDICATIONS:  Current Outpatient Medications  Medication Sig Dispense Refill  . acetaminophen (TYLENOL) 500 MG tablet Take 500 mg by mouth every 6 (six) hours as needed for moderate pain or fever.    Marland Kitchen acyclovir (ZOVIRAX) 400 MG tablet Take 1 tablet (400 mg total) by mouth 2 (two) times daily. 180 tablet 3  . aspirin EC 81 MG tablet Take 1 tablet (81 mg total) by mouth daily. 90 tablet 3  . atorvastatin (LIPITOR) 20 MG tablet Take 1 tablet (20 mg total) by mouth daily. 10 tablet 0  . Calcium Carbonate-Vit D-Min (CALCIUM 600+D PLUS MINERALS) 600-400 MG-UNIT TABS Take by mouth.    . calcium-vitamin D (OSCAL WITH D) 500-200 MG-UNIT TABS tablet Take by  mouth.    . cyclophosphamide (CYTOXAN) 50 MG capsule Take 8 capsules (400 mg) by mouth with breakfast once weekly, 3 weeks on and one week off. Take with food to minimize GI upset. Take early in the day and maintain hydration. 24 capsule 1  . dexamethasone (DECADRON) 4 MG tablet Take 5 tablets (20 mg total) by mouth once a week. Take on the day of chemo 20 tablet 3  . doxycycline (VIBRA-TABS) 100 MG tablet Take 100 mg by mouth as needed.    . metoprolol succinate (TOPROL-XL) 25 MG 24 hr tablet Take 1 tablet (25 mg total) by mouth daily. 15 tablet 0  . ondansetron (ZOFRAN) 8 MG tablet Take 1 tablet (8 mg total) by mouth every 8 (eight) hours as needed for nausea or vomiting. 20 tablet 3  . prochlorperazine (COMPAZINE) 10 MG tablet Take 1 tablet (10 mg total) by mouth every 6 (six) hours as needed (Nausea or vomiting). 30 tablet 1   No current facility-administered medications for this visit.    PHYSICAL EXAMINATION: ECOG PERFORMANCE STATUS: 1 - Symptomatic but completely ambulatory  Vitals:   08/19/19 1328  BP: 115/64  Pulse: 85  Resp: 17  Temp: 98 F (36.7 C)  SpO2: 97%   Filed Weights   08/19/19 1328  Weight: 115 lb 8 oz (52.4 kg)     GENERAL:alert, no distress and comfortable SKIN: skin color, texture, turgor are normal, no rashes or significant lesions EYES: normal, Conjunctiva are pink and non-injected, sclera clear EARS: (+) left ear normal cerumen, no discharge or obstruction, tympanic member appears to be normal,  (+) More cerumen in right ear cannel with mild dried blood discharge, tympanic member is not visible  NECK: supple, thyroid normal size, non-tender, without nodularity LYMPH:  no palpable lymphadenopathy in the cervical, axillary  LUNGS: clear to auscultation and percussion with normal breathing effort HEART: regular rate & rhythm and no murmurs and no lower extremity edema ABDOMEN:abdomen soft, non-tender and normal bowel sounds Musculoskeletal:no cyanosis of  digits and no clubbing  NEURO: alert & oriented x 3 with fluent speech, no focal motor/sensory deficits  LABORATORY DATA:  I have reviewed the data as listed CBC Latest Ref Rng & Units 08/19/2019 08/05/2019 07/29/2019  WBC 4.0 - 10.5 K/uL 3.4(L) 2.5(L) 3.0(L)  Hemoglobin 12.0 - 15.0 g/dL 10.1(L) 9.6(L) 9.8(L)  Hematocrit 36.0 - 46.0 % 30.9(L) 27.7(L) 29.2(L)  Platelets 150 - 400 K/uL 182 85(L) 80(L)     CMP Latest Ref Rng & Units 08/19/2019 08/05/2019 07/29/2019  Glucose 70 - 99 mg/dL 109(H) 87 82  BUN 8 - 23 mg/dL _0 Creatinine 0.44 - 1.00 mg/dL 0.79 0.72 0.74  Sodium 135 - 145 mmol/L 137 139 137  Potassium 3.5 - 5.1 mmol/L 4.1 4.0 3.9  Chloride 98 - 111 mmol/L 104 106 105  CO2 22 - 32 mmol/L 23 22 21(L)  Calcium 8.9 - 10.3 mg/dL 8.7(L) 8.6(L) 8.4(L)  Total Protein 6.5 - 8.1 g/dL 6.2(L) 6.2(L) 6.3(L)  Total Bilirubin 0.3 - 1.2 mg/dL 0.8 0.7 0.7  Alkaline Phos 38 - 126 U/L 89 95 103  AST 15 - 41 U/L _1 ALT 0 - 44 U/L _2 RADIOGRAPHIC STUDIES: I have personally reviewed the radiological images as listed and agreed with the findings in the report. No results found.   ASSESSMENT & PLAN:  Shannon Rush is a 79 y.o. female with    1. IgG kappa Multiple myeloma s/p Auto SCT, intermediate risk, relapsed in 7/2017and progressed again in Oct 2020 -Diagnosed in 05/2012. Treated with induction chemo, radiation and BM transplant, but unfortunately progressed while on maintenance Revlimid 01/2016. She is currently onDaratumumabinfusion monotherapy.  -PETimagesfrom 04/09/19 showedScattered lytic lesions of bone. A few of these demonstrate hypermetabolic activity, most notably the right occipital bone lesion, C7 spinous process, Left 4th rib, andright femoral head.  -Herbone marrow biopsy from 04/02/19 which showsnormocellular marrow with minimal involvement by plasma cell neoplasm(2%). -unfortunately she has progressed recently with increased M-protein and more  symptomatic bone pain from MM, s/p palliative radiation to left hip and right scalp  -Based on Dr. Noah Delaine recommendation I changedher treatment to CyBorD (oral cyclophosphamide 571m, Velcade 1.344mm2, and dexa 2061mweekly) started on11/9/20.After 2 doses of Velcade, I switched toweeklyKyprolis3 weeks on/1 week offstarting 06/14/19 due to neuropathy.Chemo was previously held for recent RT.  -Given frequent infusions I offered her PAC placement, she declined.  -She is clinically stable. Labs reviewed, WBC 3.4, Hg 10.1. Overall adequate to proceed with  C3D1 IV Kyprolis today. Will reduce Cytoxan to 400m34maily from this cycle due to thrombocytopenia. She will continue 20mg20ma in pre-meds but can stop home oral dexa.  -Her M-Protein and light chain is trending down, indicating response to treatment. At some point we may proceed with maintenance Kyprolis as single agent.  -F/u in 2 weeks then monthly if stable. -She has received first COVID19 vaccination and will have second vaccine soon.   2. Left hip pain  -she had left hip surgery with rod in 2009  -She developed new left hip pain on weightbearing In Dec 2020, no painat resting  -She received target RT 07/05/19-07/20/19. Now her pain is nearly resolved, minimal now   3. Left petrous apex bone lesion with hearing lossandRight OccipitalskullLesion  -Her MRI brain from 01/07/19 showedabnormal osseous lesion measuring up to approximately 5.9 cm centered at the left petrous apex. Thismost likely is the cause of her underlyingleft ear and balancesymptoms. -Given she is symptomatic, sheunderwent target RT to skull case myeloma lesion with Dr ManniTammi Klippel7/15/20. This is negative on9/25/20PET scan -Her PET from 04/09/19 shows lesion with hypermetabolic activity in this area concerning for MM progression  here.She was symptomatic.  -She recently completedRT to right occipital lesion per Dr. Manning12/21/20-07/20/19.Tolerated  well.She has moderate hair loss at RT site, her dizziness improved but muffled hearing is the same.  -Her ear exam overall normal today, (08/19/19) I recommend OTC ear drops to help with cerumen of right ear.   4. History of multiple pneumonia andbronchitis -She has had multiple episodes of pneumonia, required hospitalization,and bronchitis, required antibiotics -She has recovered well, will continue monitoring. I encouraged her to do spirometry,and exercise. -Her vaccines are up-to-date.  5. Pancytopenia -She mainly has anemia and Thrombocytopenia concerningwhich occurred with recent(03/2019) disease progression. -Did give bloodtransfusionon 06/04/19  -Has recurred with oral Cytoxan. I encouraged her to avoid injury or fall given thrombocytopenia   6. CHF, CAD, inferior STEMI in 02/2014, HLD -She is on ASA, BB, statin. She will continue to f/u with Dr. Meda Coffee -BPwell controlled and normal lately. -10/2020echo showed normal EF, will continue monitoring   7. Osteoporosis with multiple fractures -Last DEXA in 2015 showed worsening osteoporosis. She declined repeat DEXA.  -She had a fall induced fracture in 01/2018. -She was on Zometa for 4 years, stopped after April 2018  -I restarted her Zometa q6weeks to help strengthen her bone on 06/21/19. Will monitor her dental health  8. Peripheral neuropathy in feet, G2 -Secondary to previous chemo. -She previously declined Gabapentin -Worsened since she started Velcade. Now she is on Kyprolis since 06/03/20 which cause less neuropathy. Stable.   9. Goal of care discussion  -she is full code now  10Steroid induced hyperglycemia -Secondary to steroids,her random blood glucose was 217 on 06/07/19. Has much improved recently.  -I encouraged her to drink plenty of water and reduce sugar and carbohydrate in diet.  -06/21/19 A1c at 6.3.    Plan -Labs reviewed and adequate to proceed with C3C1Kyprolis today and day 8  and 15 every 28 days.  -continue weekly dexa 25m before chemo, and oral Cytoxanto 4077mon day 1, 8 and 15.  -F/uin 2 weeks -next Zometa injection on 3/4   No problem-specific Assessment & Plan notes found for this encounter.   No orders of the defined types were placed in this encounter.  All questions were answered. The patient knows to call the clinic with any problems, questions or concerns. No barriers to learning was detected. The total time spent in the appointment was 30 minutes.     YaTruitt MerleMD 08/19/2019   I, AmJoslyn Devonam acting as scribe for YaTruitt MerleMD.   I have reviewed the above documentation for accuracy and completeness, and I agree with the above.

## 2019-08-18 ENCOUNTER — Other Ambulatory Visit: Payer: Self-pay

## 2019-08-18 ENCOUNTER — Other Ambulatory Visit: Payer: Self-pay | Admitting: Hematology

## 2019-08-18 ENCOUNTER — Telehealth: Payer: Self-pay

## 2019-08-18 DIAGNOSIS — C9002 Multiple myeloma in relapse: Secondary | ICD-10-CM

## 2019-08-18 MED ORDER — CYCLOPHOSPHAMIDE 50 MG PO CAPS: ORAL_CAPSULE | ORAL | 1 refills | Status: DC

## 2019-08-18 NOTE — Progress Notes (Signed)
error 

## 2019-08-18 NOTE — Telephone Encounter (Signed)
I will refill today.   [name] MD

## 2019-08-18 NOTE — Telephone Encounter (Signed)
Fax receieved from [facility] requesting updated rx for cyclophosphmide

## 2019-08-19 ENCOUNTER — Inpatient Hospital Stay: Payer: Medicare Other | Attending: Hematology

## 2019-08-19 ENCOUNTER — Inpatient Hospital Stay: Payer: Medicare Other

## 2019-08-19 ENCOUNTER — Inpatient Hospital Stay (HOSPITAL_BASED_OUTPATIENT_CLINIC_OR_DEPARTMENT_OTHER): Payer: Medicare Other | Admitting: Hematology

## 2019-08-19 ENCOUNTER — Encounter: Payer: Self-pay | Admitting: Hematology

## 2019-08-19 ENCOUNTER — Other Ambulatory Visit: Payer: Self-pay

## 2019-08-19 VITALS — BP 115/64 | HR 85 | Temp 98.0°F | Resp 17 | Ht 67.0 in | Wt 115.5 lb

## 2019-08-19 DIAGNOSIS — I252 Old myocardial infarction: Secondary | ICD-10-CM | POA: Insufficient documentation

## 2019-08-19 DIAGNOSIS — C9000 Multiple myeloma not having achieved remission: Secondary | ICD-10-CM | POA: Insufficient documentation

## 2019-08-19 DIAGNOSIS — Z9221 Personal history of antineoplastic chemotherapy: Secondary | ICD-10-CM | POA: Insufficient documentation

## 2019-08-19 DIAGNOSIS — Z7982 Long term (current) use of aspirin: Secondary | ICD-10-CM | POA: Diagnosis not present

## 2019-08-19 DIAGNOSIS — Z5112 Encounter for antineoplastic immunotherapy: Secondary | ICD-10-CM | POA: Diagnosis not present

## 2019-08-19 DIAGNOSIS — M818 Other osteoporosis without current pathological fracture: Secondary | ICD-10-CM

## 2019-08-19 DIAGNOSIS — D61818 Other pancytopenia: Secondary | ICD-10-CM | POA: Diagnosis not present

## 2019-08-19 DIAGNOSIS — Z923 Personal history of irradiation: Secondary | ICD-10-CM | POA: Diagnosis not present

## 2019-08-19 DIAGNOSIS — E785 Hyperlipidemia, unspecified: Secondary | ICD-10-CM | POA: Diagnosis not present

## 2019-08-19 DIAGNOSIS — Z79899 Other long term (current) drug therapy: Secondary | ICD-10-CM | POA: Diagnosis not present

## 2019-08-19 DIAGNOSIS — M25552 Pain in left hip: Secondary | ICD-10-CM | POA: Diagnosis not present

## 2019-08-19 DIAGNOSIS — Z7952 Long term (current) use of systemic steroids: Secondary | ICD-10-CM | POA: Insufficient documentation

## 2019-08-19 DIAGNOSIS — C9002 Multiple myeloma in relapse: Secondary | ICD-10-CM

## 2019-08-19 LAB — CMP (CANCER CENTER ONLY)
ALT: 17 U/L (ref 0–44)
AST: 19 U/L (ref 15–41)
Albumin: 3.7 g/dL (ref 3.5–5.0)
Alkaline Phosphatase: 89 U/L (ref 38–126)
Anion gap: 10 (ref 5–15)
BUN: 20 mg/dL (ref 8–23)
CO2: 23 mmol/L (ref 22–32)
Calcium: 8.7 mg/dL — ABNORMAL LOW (ref 8.9–10.3)
Chloride: 104 mmol/L (ref 98–111)
Creatinine: 0.79 mg/dL (ref 0.44–1.00)
GFR, Est AFR Am: >60 mL/min (ref >60)
GFR, Estimated: >60 mL/min (ref >60)
Glucose, Bld: 109 mg/dL — ABNORMAL HIGH (ref 70–99)
Potassium: 4.1 mmol/L (ref 3.5–5.1)
Sodium: 137 mmol/L (ref 135–145)
Total Bilirubin: 0.8 mg/dL (ref 0.3–1.2)
Total Protein: 6.2 g/dL — ABNORMAL LOW (ref 6.5–8.1)

## 2019-08-19 LAB — CBC WITH DIFFERENTIAL (CANCER CENTER ONLY)
Abs Immature Granulocytes: 0.02 10*3/uL (ref 0.00–0.07)
Basophils Absolute: 0 10*3/uL (ref 0.0–0.1)
Basophils Relative: 1 %
Eosinophils Absolute: 0.1 10*3/uL (ref 0.0–0.5)
Eosinophils Relative: 2 %
HCT: 30.9 % — ABNORMAL LOW (ref 36.0–46.0)
Hemoglobin: 10.1 g/dL — ABNORMAL LOW (ref 12.0–15.0)
Immature Granulocytes: 1 %
Lymphocytes Relative: 5 %
Lymphs Abs: 0.2 10*3/uL — ABNORMAL LOW (ref 0.7–4.0)
MCH: 36.5 pg — ABNORMAL HIGH (ref 26.0–34.0)
MCHC: 32.7 g/dL (ref 30.0–36.0)
MCV: 111.6 fL — ABNORMAL HIGH (ref 80.0–100.0)
Monocytes Absolute: 0.4 10*3/uL (ref 0.1–1.0)
Monocytes Relative: 13 %
Neutro Abs: 2.7 10*3/uL (ref 1.7–7.7)
Neutrophils Relative %: 78 %
Platelet Count: 182 10*3/uL (ref 150–400)
RBC: 2.77 MIL/uL — ABNORMAL LOW (ref 3.87–5.11)
RDW: 19.2 % — ABNORMAL HIGH (ref 11.5–15.5)
WBC Count: 3.4 10*3/uL — ABNORMAL LOW (ref 4.0–10.5)
nRBC: 0 % (ref 0.0–0.2)

## 2019-08-19 MED ORDER — PALONOSETRON HCL INJECTION 0.25 MG/5ML
0.2500 mg | Freq: Once | INTRAVENOUS | Status: AC
  Administered 2019-08-19: 0.25 mg via INTRAVENOUS

## 2019-08-19 MED ORDER — DEXTROSE 5 % IV SOLN
69.0000 mg/m2 | Freq: Once | INTRAVENOUS | Status: AC
  Administered 2019-08-19: 15:00:00 110 mg via INTRAVENOUS
  Filled 2019-08-19: qty 30

## 2019-08-19 MED ORDER — PALONOSETRON HCL INJECTION 0.25 MG/5ML
INTRAVENOUS | Status: AC
  Filled 2019-08-19: qty 5

## 2019-08-19 MED ORDER — SODIUM CHLORIDE 0.9 % IV SOLN
Freq: Once | INTRAVENOUS | Status: AC
  Filled 2019-08-19: qty 250

## 2019-08-19 MED ORDER — DEXAMETHASONE 4 MG PO TABS
ORAL_TABLET | ORAL | Status: AC
  Filled 2019-08-19: qty 5

## 2019-08-19 MED ORDER — DEXAMETHASONE 4 MG PO TABS
20.0000 mg | ORAL_TABLET | Freq: Once | ORAL | Status: AC
  Administered 2019-08-19: 20 mg via ORAL

## 2019-08-19 NOTE — Patient Instructions (Signed)
[facility] Discharge Instructions for Patients Receiving Chemotherapy  Today you received the following chemotherapy agents Carfilzomib (KYPROLIS).  To help prevent nausea and vomiting after your treatment, we encourage you to take your nausea medication as prescribed.  If you develop nausea and vomiting that is not controlled by your nausea medication, call the clinic.   BELOW ARE SYMPTOMS THAT SHOULD BE REPORTED IMMEDIATELY:  *FEVER GREATER THAN 100.5 F  *CHILLS WITH OR WITHOUT FEVER  NAUSEA AND VOMITING THAT IS NOT CONTROLLED WITH YOUR NAUSEA MEDICATION  *UNUSUAL SHORTNESS OF BREATH  *UNUSUAL BRUISING OR BLEEDING  TENDERNESS IN MOUTH AND THROAT WITH OR WITHOUT PRESENCE OF ULCERS  *URINARY PROBLEMS  *BOWEL PROBLEMS  UNUSUAL RASH Items with * indicate a potential emergency and should be followed up as soon as possible.  Feel free to call the clinic should you have any questions or concerns. The clinic phone number is [phone].  Please show the CHEMO ALERT CARD at check-in to the Emergency Department and triage nurse.   

## 2019-08-20 ENCOUNTER — Telehealth: Payer: Self-pay | Admitting: Hematology

## 2019-08-20 NOTE — Telephone Encounter (Signed)
Scheduled appt per 2/4 los.

## 2019-08-25 ENCOUNTER — Other Ambulatory Visit: Payer: Self-pay

## 2019-08-25 ENCOUNTER — Ambulatory Visit
Admission: RE | Admit: 2019-08-25 | Discharge: 2019-08-25 | Disposition: A | Discharge status: Home / Self Care | Payer: Medicare Other | Source: Ambulatory Visit | Attending: Radiation Oncology | Admitting: Radiation Oncology

## 2019-08-25 ENCOUNTER — Encounter: Payer: Self-pay | Admitting: Urology

## 2019-08-25 DIAGNOSIS — Z510 Encounter for antineoplastic radiation therapy: Secondary | ICD-10-CM | POA: Insufficient documentation

## 2019-08-25 DIAGNOSIS — C9000 Multiple myeloma not having achieved remission: Secondary | ICD-10-CM | POA: Insufficient documentation

## 2019-08-25 NOTE — Progress Notes (Signed)
Radiation Oncology         [phone] ________________________________  Name: Shannon Rush MRN: [id number]  Date: 08/25/2019  DOB: Mar 14, 1941  Post Treatment Note  CC: [name], MD  Nobie Putnam, MD  Diagnosis:   79 yo woman with painful right occipital and left hip involvement by multiple myeloma    Interval Since Last Radiation:  5 weeks  07/05/19-07/20/19: 1.  The Right Occipital skull lesion was treated to 20 Gy in 10 fractions of 2 Gy 2.  The Left Hip lesion was treated to 20 Gy in 10 fractions of 2 Gy  01/14/2019 - 01/27/2019: Left Petrous Apex / 20 Gy in 10 fractions  07/14/2012 - 07/27/2012: Thoracic Spine and Left Scapula / 20 Gy in 10 fractions  Narrative: I spoke with the patient to conduct her routine scheduled 1 month follow up visit via telephone to spare the patient unnecessary potential exposure in the healthcare setting during the current COVID-19 pandemic.  The patient was notified in advance and gave permission to proceed with this visit format.  She tolerated radiation treatment relatively well.   Her pain did not improve much during the treatment and she did have some hair thinning in the occipital fields.                              On review of systems, the patient states that she is doing well overall.  Her left hip pain is almost completely resolved at this point which she is most pleased with.  She has alopecia in the treatment field for the occipital lesion which is not bothersome and she denies swelling, erythema or discomfort. She denies any significant fatigue and overall is quite pleased with her progress to date.  ALLERGIES:  is allergic to zithromax [azithromycin]; zosyn [piperacillin sod-tazobactam so]; and quinolones.  Meds: Current Outpatient Medications  Medication Sig Dispense Refill  . acetaminophen (TYLENOL) 500 MG tablet Take 500 mg by mouth every 6 (six) hours as needed for moderate pain or fever.    Marland Kitchen acyclovir (ZOVIRAX) 400 MG  tablet Take 1 tablet (400 mg total) by mouth 2 (two) times daily. 180 tablet 3  . aspirin EC 81 MG tablet Take 1 tablet (81 mg total) by mouth daily. 90 tablet 3  . atorvastatin (LIPITOR) 20 MG tablet Take 1 tablet (20 mg total) by mouth daily. 10 tablet 0  . Calcium Carbonate-Vit D-Min (CALCIUM 600+D PLUS MINERALS) 600-400 MG-UNIT TABS Take by mouth.    . calcium-vitamin D (OSCAL WITH D) 500-200 MG-UNIT TABS tablet Take by mouth.    . cyclophosphamide (CYTOXAN) 50 MG capsule Take 8 capsules (400 mg) by mouth with breakfast once weekly, 3 weeks on and one week off. Take with food to minimize GI upset. Take early in the day and maintain hydration. 24 capsule 1  . dexamethasone (DECADRON) 4 MG tablet Take 5 tablets (20 mg total) by mouth once a week. Take on the day of chemo 20 tablet 3  . doxycycline (VIBRA-TABS) 100 MG tablet Take 100 mg by mouth as needed.    . metoprolol succinate (TOPROL-XL) 25 MG 24 hr tablet Take 1 tablet (25 mg total) by mouth daily. 15 tablet 0  . ondansetron (ZOFRAN) 8 MG tablet Take 1 tablet (8 mg total) by mouth every 8 (eight) hours as needed for nausea or vomiting. 20 tablet 3  . prochlorperazine (COMPAZINE) 10 MG tablet Take 1  tablet (10 mg total) by mouth every 6 (six) hours as needed (Nausea or vomiting). 30 tablet 1   No current facility-administered medications for this encounter.    Physical Findings:  vitals were not taken for this visit.   /10 Unable to assess due to telephone follow up visit format..   Lab Findings: Lab Results  Component Value Date   WBC 3.4 (L) 08/19/2019   HGB 10.1 (L) 08/19/2019   HCT 30.9 (L) 08/19/2019   MCV 111.6 (H) 08/19/2019   PLT 182 08/19/2019     Radiographic Findings: No results found.  Impression/Plan: 70. 79 yo woman with painful right occipital and left hip involvement by multiple myeloma. She appears to have recovered well from the effects of her recent radiation treatments and is currently without complaints.   She is quite pleased with the significant improvement in her left hip pain which has allowed her to remain active.  We discussed that while we are happy to continue to participate in her care if clinically indicated, at this point, we will plan to see her back on an as-needed basis.  She will continue in routine follow-up under the care and direction of Dr. Burr Medico for management of her systemic disease.  She knows to call us at anytime with any questions or concerns related to her previous radiation.      Nicholos Johns, PA-C

## 2019-08-26 ENCOUNTER — Other Ambulatory Visit: Payer: Self-pay

## 2019-08-26 ENCOUNTER — Inpatient Hospital Stay: Payer: Medicare Other

## 2019-08-26 VITALS — BP 125/89 | HR 75 | Temp 98.0°F | Resp 18

## 2019-08-26 DIAGNOSIS — Z79899 Other long term (current) drug therapy: Secondary | ICD-10-CM | POA: Diagnosis not present

## 2019-08-26 DIAGNOSIS — Z95828 Presence of other vascular implants and grafts: Secondary | ICD-10-CM

## 2019-08-26 DIAGNOSIS — E785 Hyperlipidemia, unspecified: Secondary | ICD-10-CM | POA: Diagnosis not present

## 2019-08-26 DIAGNOSIS — Z923 Personal history of irradiation: Secondary | ICD-10-CM | POA: Diagnosis not present

## 2019-08-26 DIAGNOSIS — C9000 Multiple myeloma not having achieved remission: Secondary | ICD-10-CM

## 2019-08-26 DIAGNOSIS — Z7982 Long term (current) use of aspirin: Secondary | ICD-10-CM | POA: Diagnosis not present

## 2019-08-26 DIAGNOSIS — C9002 Multiple myeloma in relapse: Secondary | ICD-10-CM

## 2019-08-26 DIAGNOSIS — M25552 Pain in left hip: Secondary | ICD-10-CM | POA: Diagnosis not present

## 2019-08-26 DIAGNOSIS — I252 Old myocardial infarction: Secondary | ICD-10-CM | POA: Diagnosis not present

## 2019-08-26 DIAGNOSIS — Z7952 Long term (current) use of systemic steroids: Secondary | ICD-10-CM | POA: Diagnosis not present

## 2019-08-26 DIAGNOSIS — Z5112 Encounter for antineoplastic immunotherapy: Secondary | ICD-10-CM | POA: Diagnosis not present

## 2019-08-26 DIAGNOSIS — D61818 Other pancytopenia: Secondary | ICD-10-CM | POA: Diagnosis not present

## 2019-08-26 DIAGNOSIS — Z9221 Personal history of antineoplastic chemotherapy: Secondary | ICD-10-CM | POA: Diagnosis not present

## 2019-08-26 LAB — CBC WITH DIFFERENTIAL (CANCER CENTER ONLY)
Abs Immature Granulocytes: 0.06 10*3/uL (ref 0.00–0.07)
Basophils Absolute: 0 10*3/uL (ref 0.0–0.1)
Basophils Relative: 1 %
Eosinophils Absolute: 0.1 10*3/uL (ref 0.0–0.5)
Eosinophils Relative: 2 %
HCT: 27.3 % — ABNORMAL LOW (ref 36.0–46.0)
Hemoglobin: 9 g/dL — ABNORMAL LOW (ref 12.0–15.0)
Immature Granulocytes: 2 %
Lymphocytes Relative: 3 %
Lymphs Abs: 0.1 10*3/uL — ABNORMAL LOW (ref 0.7–4.0)
MCH: 37 pg — ABNORMAL HIGH (ref 26.0–34.0)
MCHC: 33 g/dL (ref 30.0–36.0)
MCV: 112.3 fL — ABNORMAL HIGH (ref 80.0–100.0)
Monocytes Absolute: 0.5 10*3/uL (ref 0.1–1.0)
Monocytes Relative: 17 %
Neutro Abs: 2.3 10*3/uL (ref 1.7–7.7)
Neutrophils Relative %: 75 %
Platelet Count: 81 10*3/uL — ABNORMAL LOW (ref 150–400)
RBC: 2.43 MIL/uL — ABNORMAL LOW (ref 3.87–5.11)
RDW: 18.3 % — ABNORMAL HIGH (ref 11.5–15.5)
WBC Count: 3.1 10*3/uL — ABNORMAL LOW (ref 4.0–10.5)
nRBC: 0.7 % — ABNORMAL HIGH (ref 0.0–0.2)

## 2019-08-26 LAB — CMP (CANCER CENTER ONLY)
ALT: 18 U/L (ref 0–44)
AST: 17 U/L (ref 15–41)
Albumin: 3.7 g/dL (ref 3.5–5.0)
Alkaline Phosphatase: 82 U/L (ref 38–126)
Anion gap: 7 (ref 5–15)
BUN: 16 mg/dL (ref 8–23)
CO2: 25 mmol/L (ref 22–32)
Calcium: 8.7 mg/dL — ABNORMAL LOW (ref 8.9–10.3)
Chloride: 104 mmol/L (ref 98–111)
Creatinine: 0.68 mg/dL (ref 0.44–1.00)
GFR, Est AFR Am: >60 mL/min (ref >60)
GFR, Estimated: >60 mL/min (ref >60)
Glucose, Bld: 75 mg/dL (ref 70–99)
Potassium: 4.3 mmol/L (ref 3.5–5.1)
Sodium: 136 mmol/L (ref 135–145)
Total Bilirubin: 0.7 mg/dL (ref 0.3–1.2)
Total Protein: 5.9 g/dL — ABNORMAL LOW (ref 6.5–8.1)

## 2019-08-26 MED ORDER — SODIUM CHLORIDE 0.9 % IV SOLN
Freq: Once | INTRAVENOUS | Status: AC
  Filled 2019-08-26: qty 250

## 2019-08-26 MED ORDER — PALONOSETRON HCL INJECTION 0.25 MG/5ML
0.2500 mg | Freq: Once | INTRAVENOUS | Status: AC
  Administered 2019-08-26: 15:00:00 0.25 mg via INTRAVENOUS

## 2019-08-26 MED ORDER — DEXAMETHASONE 4 MG PO TABS
ORAL_TABLET | ORAL | Status: AC
  Filled 2019-08-26: qty 5

## 2019-08-26 MED ORDER — DEXTROSE 5 % IV SOLN
69.0000 mg/m2 | Freq: Once | INTRAVENOUS | Status: AC
  Administered 2019-08-26: 110 mg via INTRAVENOUS
  Filled 2019-08-26: qty 10

## 2019-08-26 MED ORDER — ZOLEDRONIC ACID 4 MG/5ML IV CONC: 3.5000 mg | Freq: Once | INTRAVENOUS | Status: DC

## 2019-08-26 MED ORDER — PALONOSETRON HCL INJECTION 0.25 MG/5ML
INTRAVENOUS | Status: AC
  Filled 2019-08-26: qty 5

## 2019-08-26 MED ORDER — DEXAMETHASONE 4 MG PO TABS
20.0000 mg | ORAL_TABLET | Freq: Once | ORAL | Status: AC
  Administered 2019-08-26: 20 mg via ORAL

## 2019-08-26 NOTE — Progress Notes (Signed)
Per Dr. Burr Medico ok to treat with platelet count 81,000.  Pt to hold cytoxan this week and next until she sees Dr. Burr Medico. Shannon [place] rn relayed this information to patient.  I added to AVS

## 2019-08-26 NOTE — Patient Instructions (Signed)
HOLD CYTOXAN UNTIL YOU SEE DR Burr Medico NEXT WEEK

## 2019-08-26 NOTE — Progress Notes (Signed)
Per Dr. Burr Medico, okay to treat with platelets 81. Patient to hold cytoxan until visit with Dr. Burr Medico.

## 2019-08-27 ENCOUNTER — Ambulatory Visit: Payer: Medicare Other | Attending: Internal Medicine

## 2019-08-27 DIAGNOSIS — Z23 Encounter for immunization: Secondary | ICD-10-CM | POA: Insufficient documentation

## 2019-08-27 NOTE — Progress Notes (Signed)
   Covid-19 Vaccination Clinic  Name:  IRESHA LEVISON    MRN: [id number] DOB: [birth year]  08/27/2019  Ms. Amalfitano was observed post Covid-19 immunization for 15 minutes without incidence. She was provided with Vaccine Information Sheet and instruction to access the V-Safe system.   Ms. Sobczyk was instructed to call 911 with any severe reactions post vaccine: Marland Kitchen Difficulty breathing  . Swelling of your face and throat  . A fast heartbeat  . A bad rash all over your body  . Dizziness and weakness    Immunizations Administered    Name Date Dose VIS Date Route   Pfizer COVID-19 Vaccine 08/27/2019  8:41 AM 0.3 mL 06/25/2019 Intramuscular   Manufacturer: [place]   Lot: [id number]   Shirley: [id number]

## 2019-08-30 NOTE — Progress Notes (Signed)
[place]   [phone] [phone]   Clinic Follow up Note   Patient Care Team: [name], MD as PCP - General (Family Medicine) [name], MD as PCP - Cardiology (Cardiology) [name], MD as Attending Physician (Obstetrics and Gynecology) [name], MD as Consulting Physician (Internal Medicine) [name], MD as Consulting Physician (Hematology) [name], MD as Consulting Physician (Ophthalmology) [name], MD as Consulting Physician (Cardiology) [name], MD as Consulting Physician (Dermatology) [name] (Dentistry) [name], DPM as Consulting Physician (Podiatry) Pleasant, Eppie Gibson, RN as [facility]  Date of Service:  09/02/2019  CHIEF COMPLAINT: F/u of MM  SUMMARY OF ONCOLOGIC HISTORY: Oncology History Overview Note  Multiple myeloma [facility])   Staging form: Multiple Myeloma, AJCC 6th Edition   - Clinical: Stage IIIA - Signed by [name], MD on 09/04/2013    Multiple myeloma ([place])  05/26/2012 Initial Diagnosis   Presenting IgG was 4,040 mg/dL on 06/05/2012 (IgA 35; IgM 34); kappa free light chain 34.4 mg/dL, lambda 0.00, kappa:lambda ratio of 34.75; SPEP with M-spike of 2.60.   06/30/2012 Imaging   Numerous lytic lesions throughout the calvarium.  Lytic lesion within the left lateral scapula.  Findings most compatible with metastases or myeloma. Multiple mid and lower thoracic compression fractures.  Slight compression through the endplates at L3.   73/53/2992 Bone Marrow Biopsy   Bone marrow biopsy showed 49% plasma cell. Normal classical cytogenetics; however, myeloma FISH panel showed 13q- (intermediate risk)       07/14/2012 - 07/27/2012 Radiation Therapy   Palliative radiation 20 Gy over 10 fractions between to thoracic spine cord compression and symptomatic left scapula lytic lesion.   08/10/2012 - 11/2012 Chemotherapy   Started SQ Velcade once weekly, 3 weeks on, 1 weeks off; daily Revlimid d1-21, 7 days off; and Dexamethasone '40mg'$  PO weekly.    02/12/2013 Bone Marrow Transplant   Auto bone marrow transplant at [facility]     06/14/2013 Tumor Marker   IgG  1830,  Kappa:lamba ratio, 1.69 (baseline was 34.75 or   M-spike 0.28 (baseline of 2.6 or  89.3% of baseline).     06/25/2013 -  Chemotherapy   Started zometa 3.5 mg monthly    09/01/2013 - 01/2014 Chemotherapy   Maintenance therapy with revlimid '5mg'$  daily for 14 days and then off for 7 (decreased from 21 days on and 7 days off based on neutropenia). Stopped due to disease progression 01/2017   09/13/2013 - 09/17/2014 Hospital Admission   Hospital admission for pneumonia   12/20/2013 Treatment Plan Change   Maintenance therapy decreased to 2.5 mg daily for 21 days and then off for 7 days based on low counts.    01/25/2014 Tumor Marker   IgG 1360 M spike 0.5   03/01/2014 - 03/05/2014 Hospital Admission   [facility] center with an inferior STEMI, received thrombolytic therapy, cath showed 60% prox RCA, mid-distal RCA 50%, Echo normal, no stents.   04/11/2014 - 08/07/2014 Chemotherapy   Continue Revlimid at 2.5 mg 3 weeks on 1 week off   08/08/2014 - 08/13/2014 Hospital Admission   Hospital admission for pneumonia. Her Revlimid was held.   08/29/2014 - 09/14/2014 Chemotherapy   Maintenance Revlimid restarted   09/14/2014 - 09/17/2014 Hospital Admission   Admitted for pneumonia after coming back from a trip in [name]. Revlimid was held again.   11/13/2014 - 12/25/2015 Chemotherapy   Maintenance Revlimid restarted, changed to  2.65m daily, 2 weks on, 1 week off from 12/15/2014, stopped due to disease progression    01/24/2016 Progression   Patient is M protein has gradually increased to 1.0g, repeated a bone marrow biopsy showed plasma cell 10-30%   02/27/2016 - 03/07/2016 Chemotherapy   Pomalidomide 4 mg daily on day 1-21, dexamethasone 40 mg on day 1,  8 and 15, every 28 days, started on 02/27/2016, daratumumab weekly started on 03/07/2016, held after first dose, due to hospitalization and a severe pancytopenia, pomalidomide was stopped afterwards    03/10/2016 - 03/29/2016 Hospital Admission   Pt was admitted for sepsis from pneumonia, and severe pancytopenia. She required ICU stay for a few days due to hypotension, developed b/l pleural effusion required diuretics and b/l thoracentesis. She also required blood and plt transfusion, and prolonged neupogen injection for severe neutropenia. She was discharged home after 3 weeks hospital stay    04/26/2016 - 04/26/2019 Chemotherapy   Daratumumab restarted on 04/26/2016, Velcade 1.351mm2 and dexa 2050meekly start on 11/3, velcade held since 06/28/2016 due to CHF, dexa stopped on12/11/2017 due to high risk of fracture.  Dara reduced to every 4 weeks due to COVID-19 starting 10/23/18. Returned to every 3 weeks starting 12/17/18. Restarted every 2 weeks starting 02/22/19 Stopped on 04/26/19 due to disease progression.    08/26/2016 Echocardiogram   - Left ventricle: LVEF is approximately 35% with diffuse diffuse   hypokinesis with severe hypokinesis/ akinesis of the   inferior/inferoseptal walls. In comparison to echo images from   December 2017, there does not appear to be significant change The   cavity size was normal. Wall thickness was normal. Doppler   parameters are consistent with abnormal left ventricular   relaxation (grade 1 diastolic dysfunction). - Aortic valve: AV is thickened. 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 Present in echo from December 2017 There was   trivial regurgitation. - Right ventricle: Systolic function was mildly reduced. - Right atrium: The atrium was mildly dilated. - Pericardium, extracardiac: A trivial pericardial effusion was   identified.   12/03/2016 Echocardiogram   -Left Ventricle: Systolic  function was   mildly to moderately reduced. The estimated ejection fraction was   in the range of 40% to 45%. Diffuse hypokinesis. Doppler   parameters are consistent with abnormal left ventricular   relaxation (grade 1 diastolic dysfunction). Doppler parameters   are consistent with indeterminate ventricular filling pressure. - Left atrium: The atrium was severely dilated. - Tricuspid valve: There was trivial regurgitation. - Pulmonary arteries: Systolic pressure was within the normal   range. PA peak pressure: 33 mm Hg (S). - Global longitudinal strain -10.5% (abnormal)   03/12/2017 Echocardiogram   ECHO 03/12/17 Study Conclusions - Left ventricle: The cavity size was normal. Wall thickness was   increased in a pattern of mild LVH. Systolic function was normal.   The estimated ejection fraction was in the range of 50% to 55%.   There is hypokinesis of the basalinferior myocardium. Doppler   parameters are consistent with abnormal left ventricular   relaxation (grade 1 diastolic dysfunction). - Aortic valve: There was trivial regurgitation. - Mitral valve: Calcified annulus. Mildly thickened leaflets .   There was trivial regurgitation. - Tricuspid valve: There was mild regurgitation. Impressions: - There has been mild improvement in EF since prior study.    12/12/2017 - 12/15/2017 Hospital Admission   Admission diagnosis: Pnuemonia and acute hypoxic respiratory failure Additional comments: treated  for pneumoania and acute hypoxic respiratory failure before she was discharged on 12/15/17 with oral anitbiotics. She did not require home oxygen.    12/30/2017 - 01/02/2018 Hospital Admission   Admission diagnosis: Pnuemonia  Additional comments: She was again hospitalized on 12/30/17 for pneumonia, ID work up was otherwise negative, she was discharged home with oral antibiotics.    04/01/2018 Imaging   04/01/2018 CXR IMPRESSION: Persisting airspace disease superimposed on chronic  changes, suggesting pneumonia given the history. Followup PA and lateral chest X-ray is recommended in 3-4 weeks following therapy to ensure resolution and exclude underlying malignancy.  Redemonstration of mid and lower thoracic compression fractures with associated thoracic kyphosis.   04/23/2018 Echocardiogram   04/23/2018 ECHO LV EF: 45% -   50%   01/07/2019 Imaging   MRI Brain 01/07/19 IMPRESSION: MRI HEAD IMPRESSION:   1. 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 Lesion closely approximates the left inner ear structures and involves the left Meckel's cave and left fifth cranial nerve, and most likely is the causative etiology for patient's underlying left ear and facial symptoms. Follow-up examination with postcontrast imaging suggested for complete evaluation. Inclusion of IAC sequences would likely be helpful for visualization. 2. Additional 2.7 cm lesion involving the right occipital calvarium with adjacent marrow signal abnormality, also likely related to multiple myeloma. Metastatic disease would be the primary differential consideration. 3. Underlying age-appropriate cerebral atrophy with mild chronic microvascular ischemic disease. No other acute intracranial abnormality.   MRA HEAD IMPRESSION:   1. Negative intracranial MRA with no large vessel occlusion, hemodynamically significant stenosis, or other acute vascular abnormality. 2. Petrous and cavernous left ICA is partially encased and surrounded by the left petrous apex lesion without significant irregularity or stenosis. 3. 2 mm right paraophthalmic aneurysm.   MRA NECK IMPRESSION:   Normal MRA of the neck with wide patency of the carotid and vertebral arteries bilaterally. No hemodynamically significant stenosis or other acute vascular abnormality identified.      01/14/2019 - 01/27/2019 Radiation Therapy   By Dr. Tammi Klippel.    Diagnosis:   79 yo woman with skull base myeloma lesion causing left ear pain      Indication for treatment:  Palliative        Radiation treatment dates:   01/14/2019 - 01/27/2019   Site/dose:   Skull Base / 20 Gy in 10 fractions   Beams/energy:   3D, photons / 10X, 6X   04/01/2019 Pathology Results   Bone Marrow biopsy 04/01/19  DIAGNOSIS: BONE MARROW, ASPIRATE, CLOT, CORE: - Normocellular marrow with minimal involvement by plasma cell neoplasm - See comment PERIPHERAL BLOOD: - Macrocytic anemia and leukopenia -See complete blood count   04/09/2019 PET scan   PET 04/09/19  IMPRESSION: 1. Scattered lytic lesions of bone. A few of these demonstrate hypermetabolic activity, most notably the right occipital bone lesion; the lytic lesion in the approximately C7 spinous process; the lytic expansile lesion of the left fourth rib anteriorly; and a small lytic focus in the right femoral head. Other scattered lytic lesions have only low-grade activity. 2. Multiple fractures as detailed above. 3. Other imaging findings of potential clinical significance: Aortic Atherosclerosis (ICD10-I70.0). Coronary atherosclerosis.   05/10/2019 -  Chemotherapy   -CyBorD (cyclophosphamide (CYTOXAN) '500mg'$  ('300mg'$ /m2), Velcade 1.'3mg'$ /m2, and dexa '20mg'$ , weekly) starting 05/24/19.   -Due to neuropathy after 2 doses of Velcade, I switched to weekly Kyprolis  3 weeks on/1 week off on 06/14/19.   -Chemo held for RT 07/05/19-07/22/19.   -Dexa stopped and Cytoxan dose reduced to 400 mg on day 1, 8 and 15 every 28 days, from cycle 3.     07/05/2019 - 07/20/2019 Radiation Therapy   By Dr. Tammi Klippel   Diagnosis:   79 yo woman with painful right occipital and left hip involvement by multiple myeloma      Indication for treatment:  Palliation        Radiation treatment dates:   07/05/19-07/20/19   Site/dose:    1.  The Right Occipital skull lesion was treated  to 20 Gy in 20 fractions of 2 Gy 2.  The Left Hip lesion was treated to 20 Gy in 20 fractions of 2 Gy   Beams/energy:      1.  The Right Occipital skull lesion was treated using a 3-field 3D set-up 2.  The Left Hip lesion was treated anterior and posterior fields.      CURRENT THERAPY:  -CyBorD (cyclophosphamide(CYTOXAN)'500mg'$  ('300mg'$ /m2),Velcade 1.'3mg'$ /m2, and dexa '20mg'$ , weekly) starting 05/24/19.             -Due toneuropathyafter 2 doses of Velcade, I switched toweeklyKyprolis 3 weeks on/1 week off on 06/14/19.              -Chemo heldfor RT 07/05/19-07/22/19.             -Dexa reduced to '20mg'$  and Cytoxan dose reduced to 400 mg on day 1, 8 and 15 every 28 days, from cycle 3 on 08/19/19 -Restart Zometa injections every6 weekson 06/21/19.   INTERVAL HISTORY:  BRINAE WOODS is here for a follow up and treatment. She presents to the clinic alone.  She is clinically stable, has been tolerating treatment well overall.  She has mild to moderate fatigue, able to function very well at home.  She denies any significant pain, no fever, chills, or other new symptoms.  She did not take Cytoxan last week due to thrombocytopenia.  No episodes of bleeding.  Review of system otherwise negative.   MEDICAL HISTORY:  Past Medical History:  Diagnosis Date  . CAD (coronary artery disease)    a. inf STEMI (in [place] >>> [facility]) >>> LHC (8/15- [facility]):  pRCA 60%, mid to dist RCA 50% >>> med rx  . Cardiomyopathy ([place])   . Cataract   . Chronic combined systolic and diastolic CHF (congestive heart failure) ([place])    cancer medication related   . Cord compression ([place]) 07/13/12   MRI- diffuse myeloma involvement of T-L spine  . History of radiation therapy 07/13/12-07/27/12   spinal cord compression T3-T10,left scapula  . 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 Multiple myeloma ([place]) 07/01/2012  . MVP (mitral valve prolapse)    a. trivial by echo 06/2017.  . OSTEOPOROSIS  06/11/2010   Multiple compression fractures; and spontaneous fracture of sternum Qualifier: Diagnosis of  By: [name], [name]     . Thoracic kyphosis 07/13/12   per MRI scan  . Unspecified deficiency anemia     SURGICAL HISTORY: Past Surgical History:  Procedure Laterality Date  . APPENDECTOMY    . CATARACT EXTRACTION, BILATERAL    . CESAREAN SECTION     x2   . COLONOSCOPY  2007   neg with Dr. [name]  . ELBOW SURGERY    . HIP SURGERY  2009   left  . TUBAL LIGATION      I  have reviewed the social history and family history with the patient and they are unchanged from previous note.  ALLERGIES:  is allergic to zithromax [azithromycin]; zosyn [piperacillin sod-tazobactam so]; and quinolones.  MEDICATIONS:  Current Outpatient Medications  Medication Sig Dispense Refill  . acetaminophen (TYLENOL) 500 MG tablet Take 500 mg by mouth every 6 (six) hours as needed for moderate pain or fever.    Marland Kitchen acyclovir (ZOVIRAX) 400 MG tablet Take 1 tablet (400 mg total) by mouth 2 (two) times daily. 180 tablet 3  . aspirin EC 81 MG tablet Take 1 tablet (81 mg total) by mouth daily. 90 tablet 3  . atorvastatin (LIPITOR) 20 MG tablet Take 1 tablet (20 mg total) by mouth daily. 10 tablet 0  . Calcium Carbonate-Vit D-Min (CALCIUM 600+D PLUS MINERALS) 600-400 MG-UNIT TABS Take by mouth.    . calcium-vitamin D (OSCAL WITH D) 500-200 MG-UNIT TABS tablet Take by mouth.    . cyclophosphamide (CYTOXAN) 50 MG capsule Take 8 capsules (400 mg) by mouth with breakfast once weekly, 3 weeks on and one week off. Take with food to minimize GI upset. Take early in the day and maintain hydration. 24 capsule 1  . dexamethasone (DECADRON) 4 MG tablet Take 5 tablets (20 mg total) by mouth once a week. Take on the day of chemo 20 tablet 3  . doxycycline (VIBRA-TABS) 100 MG tablet Take 100 mg by mouth as needed.    . metoprolol succinate (TOPROL-XL) 25 MG 24 hr tablet Take 1 tablet (25 mg total) by mouth daily. 15 tablet 0    . ondansetron (ZOFRAN) 8 MG tablet Take 1 tablet (8 mg total) by mouth every 8 (eight) hours as needed for nausea or vomiting. 20 tablet 3  . prochlorperazine (COMPAZINE) 10 MG tablet Take 1 tablet (10 mg total) by mouth every 6 (six) hours as needed (Nausea or vomiting). 30 tablet 1   No current facility-administered medications for this visit.   Facility-Administered Medications Ordered in Other Visits  Medication Dose Route Frequency Provider Last Rate Last Admin  . carfilzomib (KYPROLIS) 110 mg in dextrose 5 % 100 mL chemo infusion  69 mg/m2 (Treatment Plan Recorded) Intravenous Once [name], MD 310 mL/hr at 09/02/19 1403 110 mg at 09/02/19 1403    PHYSICAL EXAMINATION: ECOG PERFORMANCE STATUS: 2 - Symptomatic, <50% confined to bed Blood pressure 155/86, heart rate 67, respiratory rate 16, temperature 36.6, pulse ox 100% on room air GENERAL:alert, no distress and comfortable SKIN: skin color, texture, turgor are normal, no rashes or significant lesions No leg edema NEURO: alert & oriented x 3 with fluent speech, no focal motor/sensory deficits  LABORATORY DATA:  I have reviewed the data as listed CBC Latest Ref Rng & Units 09/02/2019 08/26/2019 08/19/2019  WBC 4.0 - 10.5 K/uL 3.3(L) 3.1(L) 3.4(L)  Hemoglobin 12.0 - 15.0 g/dL 9.0(L) 9.0(L) 10.1(L)  Hematocrit 36.0 - 46.0 % 27.5(L) 27.3(L) 30.9(L)  Platelets 150 - 400 K/uL 77(L) 81(L) 182     CMP Latest Ref Rng & Units 09/02/2019 08/26/2019 08/19/2019  Glucose 70 - 99 mg/dL 73 75 109(H)  BUN 8 - 23 mg/dL '21 16 20  '$ Creatinine 0.44 - 1.00 mg/dL 0.67 0.68 0.79  Sodium 135 - 145 mmol/L 136 136 137  Potassium 3.5 - 5.1 mmol/L 4.0 4.3 4.1  Chloride 98 - 111 mmol/L 105 104 104  CO2 22 - 32 mmol/L '23 25 23  '$ Calcium 8.9 - 10.3 mg/dL 8.6(L) 8.7(L) 8.7(L)  Total Protein 6.5 - 8.1  g/dL 6.1(L) 5.9(L) 6.2(L)  Total Bilirubin 0.3 - 1.2 mg/dL 0.8 0.7 0.8  Alkaline Phos 38 - 126 U/L 76 82 89  AST 15 - 41 U/L '22 17 19  '$ ALT 0 - 44 U/L '24 18 17       '$ RADIOGRAPHIC STUDIES: I have personally reviewed the radiological images as listed and agreed with the findings in the report. No results found.   ASSESSMENT & PLAN:  Shannon Rush is a 79 y.o. female with    1. IgG kappa Multiple myeloma s/p Auto SCT, intermediate risk, relapsed in 7/2017and progressed again in Oct 2020 -Diagnosed in 05/2012. Treated with induction chemo, radiation and BM transplant, but unfortunately progressed while on maintenance Revlimid 01/2016. She is currently onDaratumumabinfusion monotherapy.  -PETimagesfrom 04/09/19 showedScattered lytic lesions of bone. A few of these demonstrate hypermetabolic activity, most notably the right occipital bone lesion, C7 spinous process, Left 4th rib, andright femoral head.  -Herbone marrow biopsy from 04/02/19 which showsnormocellular marrow with minimal involvement by plasma cell neoplasm(2%). -unfortunately she has progressed previously with increased M-protein and more symptomatic bone pain from MM, s/p palliative radiation to left hip and right scalp -Based on Dr. Noah Delaine recommendation I changedher treatment to CyBorD (oral cyclophosphamide '500mg'$ , Velcade 1.'3mg'$ /m2, and dexa '20mg'$ , weekly) started on11/9/20.After 2 doses of Velcade, I switched toweeklyKyprolis3 weeks on/1 week offstarting 06/14/19 due to neuropathy.Chemo was previously held forrecentRT. -Given frequent infusions I offered her PAC placement, she declined.  -Given thrombocytopenia I held her cytoxan C2 week 2 and 3 Cytoxan.  Dose was reduced to 400 mg for cycle 3, due to recurrent thrombocytopenia, Cytoxan was held again last week -She is clinically doing well, tolerating Kyprolis well, labs reviewed, adequate for treatment, will proceed C3D15 treatment today  -f/u in 2 weeks for cycle 4, will reduce Cytoxan to '300mg'$  weekly from next cycle   2. Left hip pain  -she had left hip surgery with rod in 2009  -She developed new left hip  pain on weightbearing In Dec 2020, no painat resting -She received target RT 07/05/19-07/20/19. Now her pain is nearly resolved, minimal now   3. Left petrous apex bone lesion with hearing lossandRight OccipitalskullLesion  -Her MRI brain from 01/07/19 showedabnormal osseous lesion measuring up to approximately 5.9 cm centered at the left petrous apex. Thismost likely is the cause of her underlyingleft ear and balancesymptoms. -Given she is symptomatic, sheunderwent target RT to skull case myeloma lesion with Dr Tammi Klippel 7/1-7/15/20. This is negative on9/25/20PET scan -Her PET from 04/09/19 shows lesion with hypermetabolic activity in this area concerning for MM progression here.She was symptomatic.  -She recently completedRT to right occipital lesion per Dr. Manning12/21/20-07/20/19.Tolerated well.She has moderate hair loss at RT site, her dizziness improved but muffled hearing is the same.  -overall stable   4. History of multiple pneumonia andbronchitis -She has had multiple episodes of pneumonia, required hospitalization,and bronchitis, required antibiotics -She has recovered well, will continue monitoring. I encouraged her to do spirometry,and exercise. -Her vaccines are up-to-date. She has received COVID vaccine lately   5. Pancytopenia -She mainly has anemia and Thrombocytopenia concerningwhich occurred with recent(03/2019) disease progression. -Did give bloodtransfusionon 06/04/19  -Has recurred with oral Cytoxan. I encouraged her to avoid injury or fall given thrombocytopenia  6. CHF, CAD, inferior STEMI in 02/2014, HLD -She is on ASA, BB, statin. She will continue to f/u with Dr. Meda Coffee -BPwell controlled and normal lately. -10/2020echo showed normal EF, will continue monitoring   7. Osteoporosis with multiple  fractures -Last DEXA in 2015 showed worsening osteoporosis. She declined repeat DEXA.  -She had a fall induced fracture in 01/2018. -She  was on Zometa for 4 years, stopped after April 2018  -I restarted herZometa q6weeks to help strengthen her boneon12/7/20. Will monitor her dental health  8. Peripheral neuropathy in feet, G2 -Secondary to previous chemo. -She previously declined Gabapentin -Worsened since she started Velcade. Now she is on Kyprolis since 06/03/20 which cause less neuropathy. Stable.   9. Goal of care discussion  -she is full code now  10Steroid induced hyperglycemia -Secondary to steroids,her random blood glucose was 217on 06/07/19. Has much improved recently. -I encouraged her to drink plenty of water and reduce sugar and carbohydrate in diet.  -06/21/19 A1c at 6.3. -Dexamethasone has been reduced to 20 mg weekly, BG has been normal lately    Plan -Labs reviewed and adequate to proceed withC3C15Kyprolis today  -Due to thrombocytopenia, will hold Cytoxan today, and will reduce dose to 300 mg weekly for next cycle -continue weekly dexa '20mg'$  before chemo -F/uin 2 weeks -next Zometa injection on 3/4   No problem-specific Assessment & Plan notes found for this encounter.   No orders of the defined types were placed in this encounter.  All questions were answered. The patient knows to call the clinic with any problems, questions or concerns. No barriers to learning was detected. The total time spent in the appointment was 30 minutes.     [name], MD 09/02/2019   I, Joslyn Devon, am acting as scribe for [name], MD.   I have reviewed the above documentation for accuracy and completeness, and I agree with the above.

## 2019-09-02 ENCOUNTER — Inpatient Hospital Stay: Payer: Medicare Other

## 2019-09-02 ENCOUNTER — Other Ambulatory Visit: Payer: Self-pay

## 2019-09-02 ENCOUNTER — Encounter: Payer: Self-pay | Admitting: Hematology

## 2019-09-02 ENCOUNTER — Inpatient Hospital Stay: Payer: Medicare Other | Admitting: Hematology

## 2019-09-02 VITALS — BP 155/86 | HR 67 | Temp 97.9°F | Resp 16 | Wt 118.5 lb

## 2019-09-02 DIAGNOSIS — D61818 Other pancytopenia: Secondary | ICD-10-CM | POA: Diagnosis not present

## 2019-09-02 DIAGNOSIS — E785 Hyperlipidemia, unspecified: Secondary | ICD-10-CM | POA: Diagnosis not present

## 2019-09-02 DIAGNOSIS — Z7982 Long term (current) use of aspirin: Secondary | ICD-10-CM | POA: Diagnosis not present

## 2019-09-02 DIAGNOSIS — Z7952 Long term (current) use of systemic steroids: Secondary | ICD-10-CM | POA: Diagnosis not present

## 2019-09-02 DIAGNOSIS — C9002 Multiple myeloma in relapse: Secondary | ICD-10-CM

## 2019-09-02 DIAGNOSIS — C9000 Multiple myeloma not having achieved remission: Secondary | ICD-10-CM

## 2019-09-02 DIAGNOSIS — M25552 Pain in left hip: Secondary | ICD-10-CM | POA: Diagnosis not present

## 2019-09-02 DIAGNOSIS — I252 Old myocardial infarction: Secondary | ICD-10-CM | POA: Diagnosis not present

## 2019-09-02 DIAGNOSIS — Z5112 Encounter for antineoplastic immunotherapy: Secondary | ICD-10-CM | POA: Diagnosis not present

## 2019-09-02 DIAGNOSIS — Z923 Personal history of irradiation: Secondary | ICD-10-CM | POA: Diagnosis not present

## 2019-09-02 DIAGNOSIS — Z79899 Other long term (current) drug therapy: Secondary | ICD-10-CM | POA: Diagnosis not present

## 2019-09-02 DIAGNOSIS — Z9221 Personal history of antineoplastic chemotherapy: Secondary | ICD-10-CM | POA: Diagnosis not present

## 2019-09-02 LAB — CBC WITH DIFFERENTIAL (CANCER CENTER ONLY)
Abs Immature Granulocytes: 0.04 10*3/uL (ref 0.00–0.07)
Basophils Absolute: 0 10*3/uL (ref 0.0–0.1)
Basophils Relative: 1 %
Eosinophils Absolute: 0.1 10*3/uL (ref 0.0–0.5)
Eosinophils Relative: 2 %
HCT: 27.5 % — ABNORMAL LOW (ref 36.0–46.0)
Hemoglobin: 9 g/dL — ABNORMAL LOW (ref 12.0–15.0)
Immature Granulocytes: 1 %
Lymphocytes Relative: 4 %
Lymphs Abs: 0.1 10*3/uL — ABNORMAL LOW (ref 0.7–4.0)
MCH: 37.2 pg — ABNORMAL HIGH (ref 26.0–34.0)
MCHC: 32.7 g/dL (ref 30.0–36.0)
MCV: 113.6 fL — ABNORMAL HIGH (ref 80.0–100.0)
Monocytes Absolute: 0.4 10*3/uL (ref 0.1–1.0)
Monocytes Relative: 13 %
Neutro Abs: 2.6 10*3/uL (ref 1.7–7.7)
Neutrophils Relative %: 79 %
Platelet Count: 77 10*3/uL — ABNORMAL LOW (ref 150–400)
RBC: 2.42 MIL/uL — ABNORMAL LOW (ref 3.87–5.11)
RDW: 18.4 % — ABNORMAL HIGH (ref 11.5–15.5)
WBC Count: 3.3 10*3/uL — ABNORMAL LOW (ref 4.0–10.5)
nRBC: 0 % (ref 0.0–0.2)

## 2019-09-02 LAB — CMP (CANCER CENTER ONLY)
ALT: 24 U/L (ref 0–44)
AST: 22 U/L (ref 15–41)
Albumin: 3.8 g/dL (ref 3.5–5.0)
Alkaline Phosphatase: 76 U/L (ref 38–126)
Anion gap: 8 (ref 5–15)
BUN: 21 mg/dL (ref 8–23)
CO2: 23 mmol/L (ref 22–32)
Calcium: 8.6 mg/dL — ABNORMAL LOW (ref 8.9–10.3)
Chloride: 105 mmol/L (ref 98–111)
Creatinine: 0.67 mg/dL (ref 0.44–1.00)
GFR, Est AFR Am: >60 mL/min (ref >60)
GFR, Estimated: >60 mL/min (ref >60)
Glucose, Bld: 73 mg/dL (ref 70–99)
Potassium: 4 mmol/L (ref 3.5–5.1)
Sodium: 136 mmol/L (ref 135–145)
Total Bilirubin: 0.8 mg/dL (ref 0.3–1.2)
Total Protein: 6.1 g/dL — ABNORMAL LOW (ref 6.5–8.1)

## 2019-09-02 MED ORDER — DEXAMETHASONE 4 MG PO TABS
20.0000 mg | ORAL_TABLET | Freq: Once | ORAL | Status: AC
  Administered 2019-09-02: 20 mg via ORAL

## 2019-09-02 MED ORDER — SODIUM CHLORIDE 0.9 % IV SOLN
Freq: Once | INTRAVENOUS | Status: AC
  Filled 2019-09-02: qty 250

## 2019-09-02 MED ORDER — PALONOSETRON HCL INJECTION 0.25 MG/5ML
0.2500 mg | Freq: Once | INTRAVENOUS | Status: AC
  Administered 2019-09-02: 0.25 mg via INTRAVENOUS

## 2019-09-02 MED ORDER — DEXTROSE 5 % IV SOLN
69.0000 mg/m2 | Freq: Once | INTRAVENOUS | Status: AC
  Administered 2019-09-02: 110 mg via INTRAVENOUS
  Filled 2019-09-02: qty 30

## 2019-09-02 MED ORDER — PALONOSETRON HCL INJECTION 0.25 MG/5ML
INTRAVENOUS | Status: AC
  Filled 2019-09-02: qty 5

## 2019-09-02 MED ORDER — DEXAMETHASONE 4 MG PO TABS
ORAL_TABLET | ORAL | Status: AC
  Filled 2019-09-02: qty 5

## 2019-09-02 NOTE — Progress Notes (Signed)
Dr. Burr Medico assessing pt. Okay to tx today with plts. 77. Holding cytoxan at this time.

## 2019-09-02 NOTE — Patient Instructions (Signed)
[facility] Discharge Instructions for Patients Receiving Chemotherapy  Today you received the following chemotherapy agents Carfilzomib (KYPROLIS).  To help prevent nausea and vomiting after your treatment, we encourage you to take your nausea medication as prescribed.  If you develop nausea and vomiting that is not controlled by your nausea medication, call the clinic.   BELOW ARE SYMPTOMS THAT SHOULD BE REPORTED IMMEDIATELY:  *FEVER GREATER THAN 100.5 F  *CHILLS WITH OR WITHOUT FEVER  NAUSEA AND VOMITING THAT IS NOT CONTROLLED WITH YOUR NAUSEA MEDICATION  *UNUSUAL SHORTNESS OF BREATH  *UNUSUAL BRUISING OR BLEEDING  TENDERNESS IN MOUTH AND THROAT WITH OR WITHOUT PRESENCE OF ULCERS  *URINARY PROBLEMS  *BOWEL PROBLEMS  UNUSUAL RASH Items with * indicate a potential emergency and should be followed up as soon as possible.  Feel free to call the clinic should you have any questions or concerns. The clinic phone number is [phone].  Please show the CHEMO ALERT CARD at check-in to the Emergency Department and triage nurse.   

## 2019-09-03 LAB — KAPPA/LAMBDA LIGHT CHAINS
Kappa free light chain: 4.6 mg/L (ref 3.3–19.4)
Kappa, lambda light chain ratio: 2.42 — ABNORMAL HIGH (ref 0.26–1.65)
Lambda free light chains: 1.9 mg/L — ABNORMAL LOW (ref 5.7–26.3)

## 2019-09-06 LAB — MULTIPLE MYELOMA PANEL, SERUM
Albumin SerPl Elph-Mcnc: 3.7 g/dL (ref 2.9–4.4)
Albumin/Glob SerPl: 2 — ABNORMAL HIGH (ref 0.7–1.7)
Alpha 1: 0.3 g/dL (ref 0.0–0.4)
Alpha2 Glob SerPl Elph-Mcnc: 0.5 g/dL (ref 0.4–1.0)
B-Globulin SerPl Elph-Mcnc: 0.8 g/dL (ref 0.7–1.3)
Gamma Glob SerPl Elph-Mcnc: 0.3 g/dL — ABNORMAL LOW (ref 0.4–1.8)
Globulin, Total: 1.9 g/dL — ABNORMAL LOW (ref 2.2–3.9)
IgA: <5 mg/dL — ABNORMAL LOW (ref 64–422)
IgG (Immunoglobin G), Serum: 481 mg/dL — ABNORMAL LOW (ref 586–1602)
IgM (Immunoglobulin M), Srm: 11 mg/dL — ABNORMAL LOW (ref 26–217)
M Protein SerPl Elph-Mcnc: 0.2 g/dL — ABNORMAL HIGH
Total Protein ELP: 5.6 g/dL — ABNORMAL LOW (ref 6.0–8.5)

## 2019-09-07 ENCOUNTER — Encounter (HOSPITAL_COMMUNITY): Payer: Self-pay | Admitting: *Deleted

## 2019-09-07 ENCOUNTER — Other Ambulatory Visit: Payer: Self-pay

## 2019-09-07 ENCOUNTER — Emergency Department (HOSPITAL_COMMUNITY)
Admission: EM | Admit: 2019-09-07 | Discharge: 2019-09-07 | Disposition: A | Discharge status: Home / Self Care | Payer: Medicare Other | Attending: Emergency Medicine | Admitting: Emergency Medicine

## 2019-09-07 ENCOUNTER — Emergency Department (HOSPITAL_COMMUNITY): Payer: Medicare Other

## 2019-09-07 ENCOUNTER — Telehealth: Payer: Self-pay | Admitting: Emergency Medicine

## 2019-09-07 DIAGNOSIS — Z8616 Personal history of COVID-19: Secondary | ICD-10-CM | POA: Diagnosis not present

## 2019-09-07 DIAGNOSIS — T84011A Broken internal left hip prosthesis, initial encounter: Secondary | ICD-10-CM | POA: Diagnosis not present

## 2019-09-07 DIAGNOSIS — M978XXA Periprosthetic fracture around other internal prosthetic joint, initial encounter: Secondary | ICD-10-CM

## 2019-09-07 DIAGNOSIS — W010XXA Fall on same level from slipping, tripping and stumbling without subsequent striking against object, initial encounter: Secondary | ICD-10-CM | POA: Diagnosis not present

## 2019-09-07 DIAGNOSIS — I2510 Atherosclerotic heart disease of native coronary artery without angina pectoris: Secondary | ICD-10-CM | POA: Insufficient documentation

## 2019-09-07 DIAGNOSIS — M9702XA Periprosthetic fracture around internal prosthetic left hip joint, initial encounter: Secondary | ICD-10-CM | POA: Diagnosis not present

## 2019-09-07 DIAGNOSIS — Z79899 Other long term (current) drug therapy: Secondary | ICD-10-CM | POA: Insufficient documentation

## 2019-09-07 DIAGNOSIS — Y9301 Activity, walking, marching and hiking: Secondary | ICD-10-CM | POA: Insufficient documentation

## 2019-09-07 DIAGNOSIS — Z7982 Long term (current) use of aspirin: Secondary | ICD-10-CM | POA: Insufficient documentation

## 2019-09-07 DIAGNOSIS — Y999 Unspecified external cause status: Secondary | ICD-10-CM | POA: Insufficient documentation

## 2019-09-07 DIAGNOSIS — Y92480 Sidewalk as the place of occurrence of the external cause: Secondary | ICD-10-CM | POA: Diagnosis not present

## 2019-09-07 DIAGNOSIS — S72115A Nondisplaced fracture of greater trochanter of left femur, initial encounter for closed fracture: Secondary | ICD-10-CM | POA: Diagnosis not present

## 2019-09-07 DIAGNOSIS — Z96649 Presence of unspecified artificial hip joint: Secondary | ICD-10-CM

## 2019-09-07 DIAGNOSIS — S70912A Unspecified superficial injury of left hip, initial encounter: Secondary | ICD-10-CM | POA: Diagnosis present

## 2019-09-07 MED ORDER — MORPHINE SULFATE 15 MG PO TABS: 7.5000 mg | ORAL_TABLET | ORAL | 0 refills | Status: DC | PRN

## 2019-09-07 MED ORDER — ACETAMINOPHEN 500 MG PO TABS
1000.0000 mg | ORAL_TABLET | Freq: Once | ORAL | Status: AC
  Administered 2019-09-07: 1000 mg via ORAL

## 2019-09-07 MED ORDER — ACETAMINOPHEN 500 MG PO TABS
1000.0000 mg | ORAL_TABLET | Freq: Once | ORAL | Status: DC
  Filled 2019-09-07: qty 2

## 2019-09-07 NOTE — ED Provider Notes (Signed)
[facility] DEPT Provider Note   CSN: [id number] Arrival date & time: 09/07/19  1137     History Chief Complaint  Patient presents with  . Fall  . Hip Pain    Left    Shannon Rush is a 79 y.o. female.  79 yo F with a chief complaint of a fall.  Patient states that she is recovering from the novel coronavirus and she is trying to increase her exercise tolerance.  She has been trying to increase the amount that she walks and she was walking today with her dog and her foot got stuck in a crack in the sidewalk and she fell.  Landed on her left side.  Complaining of a skin tear to her left elbow as well as left hip pain.  She denies head injury denies loss consciousness denies neck pain back pain chest pain abdominal pain.  She has not tried to walk since the accident.  The history is provided by the patient.  Fall This is a new problem. The current episode started 2 days ago. The problem occurs constantly. The problem has not changed since onset.Pertinent negatives include no chest pain, no abdominal pain, no headaches and no shortness of breath. The symptoms are aggravated by bending and twisting. Nothing relieves the symptoms. She has tried nothing for the symptoms. The treatment provided no relief.  Hip Pain Pertinent negatives include no chest pain, no abdominal pain, no headaches and no shortness of breath.       Past Medical History:  Diagnosis Date  . CAD (coronary artery disease)    a. inf STEMI (in [place] >>> lytics) >>> LHC (8/15- [place]):  pRCA 60%, mid to dist RCA 50% >>> med rx  . Cardiomyopathy ([place])   . Cataract   . Chronic combined systolic and diastolic CHF (congestive heart failure) ([place])    cancer medication related   . Cord compression ([place]) 07/13/12   MRI- diffuse myeloma involvement of T-L spine  . History of radiation therapy 07/13/12-07/27/12   spinal cord compression T3-T10,left scapula  . 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 Multiple myeloma ([place]) 07/01/2012  . MVP (mitral valve prolapse)    a. trivial by echo 06/2017.  . OSTEOPOROSIS 06/11/2010   Multiple compression fractures; and spontaneous fracture of sternum Qualifier: Diagnosis of  By: [name], [name]     . Thoracic kyphosis 07/13/12   per MRI scan  . Unspecified deficiency anemia     Patient Active Problem List   Diagnosis Date Noted  . TIA (transient ischemic attack) 12/22/2018  . Vertigo 11/24/2018  . Hammer toe of right foot 11/24/2018  . Delayed wound healing 11/24/2018  . Acute bronchitis 08/29/2018  . Closed fracture of multiple pubic rami ([place]) 01/27/2018  . Closed fracture of multiple pubic rami, initial encounter ([place]) 01/27/2018  . Port-A-Cath in place 11/21/2017  . Goals of care, counseling/discussion 02/28/2017  . Non-ischemic cardiomyopathy ([place]) 12/04/2016  . Antineoplastic chemotherapy induced anemia 03/28/2016  . Diastolic dysfunction-grade 2 03/27/2016  . Patient on antineoplastic chemotherapy regimen   . Adrenocortical insufficiency ([place])   . Drug-induced neutropenia ([place]) 03/11/2016  . HCAP (healthcare-associated pneumonia) 03/10/2016  . Corn of foot 11/04/2014  . Protein-calorie malnutrition ([place]) 09/23/2014  . Pancytopenia ([place]) 08/10/2014  . CAD -50-60% RCA Aug 2015 03/11/2014  . History of STEMI-Aug 2015- secondary to thrombotic state from chemo 03/10/2014  . Unspecified vitamin D deficiency 05/05/2013  . History  of peripheral stem cell transplant ([place]) 03/05/2013  . History of organ or tissue transplant 03/05/2013  . Swelling, mass, or lump in chest 12/24/2012  . Multiple myeloma ([place]) 07/01/2012  . Sciatica of left side 06/03/2011  . Osteoporosis 06/11/2010  . Anemia, macrocytic 06/02/2009  . DJD, UNSPECIFIED 09/11/2006    Past Surgical History:  Procedure Laterality Date  . APPENDECTOMY    . CATARACT EXTRACTION, BILATERAL    . CESAREAN SECTION     x2   . COLONOSCOPY  2007    neg with Dr. [name]  . ELBOW SURGERY    . HIP SURGERY  2009   left  . TUBAL LIGATION       OB History   No obstetric history on file.     Family History  Problem Relation Age of Onset  . Glaucoma Mother   . Heart disease Mother   . Peripheral vascular disease Mother   . Raynaud syndrome Mother   . Heart disease Father   . Heart failure Father   . Glaucoma Father   . Cancer Brother        stage IV prostate CA  . Prostate cancer Brother   . Raynaud syndrome Daughter   . Raynaud syndrome Daughter   . Breast cancer Neg Hx   . Colon cancer Neg Hx     Social History   Tobacco Use  . Smoking status: Never Smoker  . Smokeless tobacco: Never Used  Substance Use Topics  . Alcohol use: Yes    Alcohol/week: 7.0 standard drinks    Types: 7 Standard drinks or equivalent per week    Comment: 1 glass of wine or beer 3-5 days per week  . Drug use: No    Home Medications Prior to Admission medications   Medication Sig Start Date End Date Taking? Authorizing Provider  acetaminophen (TYLENOL) 500 MG tablet Take 500 mg by mouth every 6 (six) hours as needed for moderate pain or fever.   Yes [provider]  acyclovir (ZOVIRAX) 400 MG tablet Take 1 tablet (400 mg total) by mouth 2 (two) times daily. 02/22/19  Yes [name], MD  aspirin EC 81 MG tablet Take 1 tablet (81 mg total) by mouth daily. 04/01/18  Yes Dunn, Dayna N, PA-C  atorvastatin (LIPITOR) 20 MG tablet Take 1 tablet (20 mg total) by mouth daily. Patient taking differently: Take 20 mg by mouth at bedtime.  04/02/19  Yes [name], MD  calcium-vitamin D (OSCAL WITH D) 500-200 MG-UNIT TABS tablet Take by mouth.   Yes [provider]  cyclophosphamide (CYTOXAN) 50 MG capsule Take 8 capsules (400 mg) by mouth with breakfast once weekly, 3 weeks on and one week off. Take with food to minimize GI upset. Take early in the day and maintain hydration. Patient taking differently: Take 400 mg by mouth See  admin instructions. Take 8 capsules (400 mg) by mouth with breakfast once weekly, 3 weeks on and one week off. Take with food to minimize GI upset. Take early in the day and maintain hydration. 08/18/19  Yes [name], MD  dexamethasone (DECADRON) 4 MG tablet Take 5 tablets (20 mg total) by mouth once a week. Take on the day of chemo 05/18/19  Yes [name], MD  doxycycline (VIBRA-TABS) 100 MG tablet Take 100 mg by mouth as needed (treatment for pneumonia).    Yes [provider]  metoprolol succinate (TOPROL-XL) 25 MG 24 hr tablet Take 1 tablet (25 mg total)  by mouth daily. 05/13/19  Yes [name], MD  ondansetron (ZOFRAN) 8 MG tablet Take 1 tablet (8 mg total) by mouth every 8 (eight) hours as needed for nausea or vomiting. 06/02/19  Yes [name], MD  prochlorperazine (COMPAZINE) 10 MG tablet Take 1 tablet (10 mg total) by mouth every 6 (six) hours as needed (Nausea or vomiting). 06/14/19  Yes [name], MD  morphine (MSIR) 15 MG tablet Take 0.5 tablets (7.5 mg total) by mouth every 4 (four) hours as needed for severe pain. 09/07/19   Deno Etienne, DO    Allergies    Zithromax [azithromycin], Zosyn [piperacillin sod-tazobactam so], and Quinolones  Review of Systems   Review of Systems  Constitutional: Negative for chills and fever.  HENT: Negative for congestion and rhinorrhea.   Eyes: Negative for redness and visual disturbance.  Respiratory: Negative for shortness of breath and wheezing.   Cardiovascular: Negative for chest pain and palpitations.  Gastrointestinal: Negative for abdominal pain, nausea and vomiting.  Genitourinary: Negative for dysuria and urgency.  Musculoskeletal: Positive for arthralgias and gait problem. Negative for myalgias.  Skin: Negative for pallor and wound.  Neurological: Negative for dizziness and headaches.    Physical Exam Updated Vital Signs BP (!) 147/79   Pulse 73   Temp 98 F (36.7 C) (Oral)   Resp 14   Wt 53.7 kg   SpO2 100%   BMI  18.55 kg/m   Physical Exam Vitals and nursing note reviewed.  Constitutional:      General: She is not in acute distress.    Appearance: She is well-developed. She is not diaphoretic.  HENT:     Head: Normocephalic and atraumatic.  Eyes:     Pupils: Pupils are equal, round, and reactive to light.  Cardiovascular:     Rate and Rhythm: Normal rate and regular rhythm.     Heart sounds: No murmur. No friction rub. No gallop.   Pulmonary:     Effort: Pulmonary effort is normal.     Breath sounds: No wheezing or rales.  Abdominal:     General: There is no distension.     Palpations: Abdomen is soft.     Tenderness: There is no abdominal tenderness.  Musculoskeletal:        General: Tenderness present.     Cervical back: Normal range of motion and neck supple.     Comments: Some tenderness about the greater trochanter.  No significant pain with internal and external rotation of the leg.  Left leg is somewhat shorter than the right.  Pulse motor and sensation are intact.  She has a small abrasion to the left lateral aspect of the knee.  She has a quarter size skin tear to her left elbow.  There is no bony tenderness about the elbow she has full range of motion without pain.  Palpated from head to toe without any other noted areas of bony tenderness.  Skin:    General: Skin is warm and dry.  Neurological:     Mental Status: She is alert and oriented to person, place, and time.  Psychiatric:        Behavior: Behavior normal.     ED Results / Procedures / Treatments   Labs (all labs ordered are listed, but only abnormal results are displayed) Labs Reviewed - No data to display  EKG None  Radiology DG Hip Unilat W or Wo Pelvis 2-3 Views Left  Result Date: 09/07/2019 CLINICAL DATA:  Left hip  pain after fall EXAM: DG HIP (WITH OR WITHOUT PELVIS) 2-3V LEFT COMPARISON:  06/14/2019 FINDINGS: Prior ORIF of the left hip. There is a new acute nondisplaced fracture involving the greater  trochanter of the proximal left femur extending to the proximal portion of the intramedullary rod. Hardware appears otherwise intact. Chronic left superior and inferior pubic rami fractures. Sacrum is largely obscured by stool and overlying bowel gas. IMPRESSION: Acute nondisplaced perihardware fracture involving the greater trochanter of the proximal left femur. Electronically Signed   By: Davina Poke D.O.   On: 09/07/2019 13:35    Procedures Procedures (including critical care time)  Medications Ordered in ED Medications  acetaminophen (TYLENOL) tablet 1,000 mg (1,000 mg Oral Given 09/07/19 1436)    ED Course  I have reviewed the triage vital signs and the nursing notes.  Pertinent labs & imaging results that were available during my care of the patient were reviewed by me and considered in my medical decision making (see chart for details).    MDM Rules/Calculators/A&P                      79 yo F with a chief complaint of a nonsyncopal fall.  Complaining of left-sided hip pain.  Plain film viewed by me with a periprosthetic hip fracture.  This was discussed with Dr. Doran Durand, orthopedics he recommended weightbearing as tolerated and follow-up in the office in 2 weeks.  Patient was able to ambulate here without issue.  Ortho follow-up.  2:37 PM:  I have discussed the diagnosis/risks/treatment options with the patient and believe the pt to be eligible for discharge home to follow-up with Ortho. We also discussed returning to the ED immediately if new or worsening sx occur. We discussed the sx which are most concerning (e.g., sudden worsening pain, fever, inability to tolerate by mouth) that necessitate immediate return. Medications administered to the patient during their visit and any new prescriptions provided to the patient are listed below.  Medications given during this visit Medications  acetaminophen (TYLENOL) tablet 1,000 mg (1,000 mg Oral Given 09/07/19 1436)     The patient  appears reasonably screen and/or stabilized for discharge and I doubt any other medical condition or other [facility] requiring further screening, evaluation, or treatment in the ED at this time prior to discharge.   Final Clinical Impression(s) / ED Diagnoses Final diagnoses:  Periprosthetic hip fracture, initial encounter    Rx / DC Orders ED Discharge Orders         Ordered    morphine (MSIR) 15 MG tablet  Every 4 hours PRN     09/07/19 [place], Malone Vanblarcom, DO 09/07/19 1437

## 2019-09-07 NOTE — Telephone Encounter (Signed)
Received VM from pt reporting a fall on Sunday that injured her L hip, per pt 'that's already my bad one and it really hurts'.  Pt does not mention any head injury, requests f/u on whether she should come to [place] or go to [facility]Urgent [facility].  Before pt could be called back she came to [place].  L elbow visibly bleeding per NT [place] who bandaged her arm.  Pt was limping and reported severe pain in L hip.  Pt advised to go to [facility] for evaluation per PA Lucianne Lei, pt agreed and husband took her to [facility] via w/c.

## 2019-09-07 NOTE — Discharge Instructions (Signed)
I discussed this with the orthopedic surgeon he felt you should bear weight as tolerated and then follow-up with them in the office likely in 2 weeks.  Please give them a call tomorrow and let them know that you were seen here and see if you can schedule an appointment.  Take tylenol 1000mg (2 extra strength) four times a day.   Then take the pain medicine if you feel like you need it. Narcotics do not help with the pain, they only make you care about it less.  You can become addicted to this, people may break into your house to steal it.  It will constipate you.  If you drive under the influence of this medicine you can get a DUI.

## 2019-09-07 NOTE — ED Triage Notes (Signed)
Pt states she tripped and fell while walking her dog, no LOC or head injury. Pain in left hip, assisted up.

## 2019-09-08 ENCOUNTER — Ambulatory Visit: Payer: Medicare Other

## 2019-09-14 NOTE — Chronic Care Management (AMB) (Signed)
Care Management   Initial Visit Note  09/14/2019 Name: Shannon Rush MRN: [id number] DOB: 1941-03-11  Subjective:   Objective:  Assessment: Shannon Rush is a 79 y.o. year old female who sees [name], Jamal Collin, MD for primary care. The care management team was consulted for assistance with care management and care coordination needs related to [facility] Follow up Initial.   Review of patient status, including review of consultants reports, relevant laboratory and other test results, and collaboration with appropriate care team members and the patient's provider was performed as part of comprehensive patient evaluation and provision of care management services.    SDOH (Social Determinants of Health) assessments performed: No See Care Plan activities for detailed interventions related to Shannon Rush)     Outpatient Encounter Medications as of 09/08/2019  Medication Sig Note  . acetaminophen (TYLENOL) 500 MG tablet Take 500 mg by mouth every 6 (six) hours as needed for moderate pain or fever. 09/07/2019: Took 250 mg  . acyclovir (ZOVIRAX) 400 MG tablet Take 1 tablet (400 mg total) by mouth 2 (two) times daily.   Shannon Rush aspirin EC 81 MG tablet Take 1 tablet (81 mg total) by mouth daily.   Shannon Rush atorvastatin (LIPITOR) 20 MG tablet Take 1 tablet (20 mg total) by mouth daily. (Patient taking differently: Take 20 mg by mouth at bedtime. )   . calcium-vitamin D (OSCAL WITH D) 500-200 MG-UNIT TABS tablet Take by mouth.   . cyclophosphamide (CYTOXAN) 50 MG capsule Take 8 capsules (400 mg) by mouth with breakfast once weekly, 3 weeks on and one week off. Take with food to minimize GI upset. Take early in the day and maintain hydration. (Patient taking differently: Take 400 mg by mouth See admin instructions. Take 8 capsules (400 mg) by mouth with breakfast once weekly, 3 weeks on and one week off. Take with food to minimize GI upset. Take early in the day and maintain hydration.)     . dexamethasone (DECADRON) 4 MG tablet Take 5 tablets (20 mg total) by mouth once a week. Take on the day of chemo 09/07/2019: Given with infusion  . doxycycline (VIBRA-TABS) 100 MG tablet Take 100 mg by mouth as needed (treatment for pneumonia).  09/07/2019: treatment for pneumonia  . metoprolol succinate (TOPROL-XL) 25 MG 24 hr tablet Take 1 tablet (25 mg total) by mouth daily.   Shannon Rush morphine (MSIR) 15 MG tablet Take 0.5 tablets (7.5 mg total) by mouth every 4 (four) hours as needed for severe pain.   Shannon Rush ondansetron (ZOFRAN) 8 MG tablet Take 1 tablet (8 mg total) by mouth every 8 (eight) hours as needed for nausea or vomiting.   . prochlorperazine (COMPAZINE) 10 MG tablet Take 1 tablet (10 mg total) by mouth every 6 (six) hours as needed (Nausea or vomiting).    No facility-administered encounter medications on file as of 09/08/2019.    Goals Addressed            This Visit's Progress   . " I still have some pain in my hip " (pt-stated)       CARE PLAN ENTRY (see longtitudinal plan of care for additional care plan information)  Current Barriers:  Shannon Rush Knowledge Deficits related to acute pain of fracture proximal left femur . Chronic Disease Management support and education needs related to fracture of the greater trochanter of the proximal left femur  Nurse Case Manager Clinical Goal(s):  Shannon Rush Over the next 14 days, patient will verbalize  understanding of plan for after visit instructions from the ED  Interventions:  . Evaluation of current treatment plan related to after visit instructions from visit on 09/07/19 and patient's adherence to plan as established by provider. . Assessed the patient and she states that she is doing fair.  She is having some pain that she rates a 2/10.  She states that it can be more if she turn in certain positions.  . Advised patient to if she has increased pain, any fever,nausea or vomiting to return to ER immediately.  She voiced understanding.  . Reviewed  medications with patient and discussed Pain medication at length.  The patient stated that she had not picked up her pain medication form the pharmacy yet but had call that is was ready for pick up.  She said she had pain medication from 2014 (Morphine) that she had not thrown away and taken that .  She wanted to know if that would be ok to take.  I advise her I could not give her an ok to take the medication it was an old medication and she should call her pharmacist before she took any more medicine.  She stated that she would. . Discussed plans with patient for ongoing care management follow up and provided patient with direct contact information for care management team.  Patient stated that she did not feel that she needed services at this time but would allow me to call and follow up with her. . Reviewed scheduled/upcoming provider appointments including: Discussed  with the patient that she needs an appointment with Orthopedics.  She stated that she was finishing breakfast and would call to schedule an appointment this morning. . I asked the patient did she need for me to schedule an appointment with Dr. Andria Frames and she declined. She did not feel she needed an appointment at this time.   . She states that she has transportation to her appointments.  Patient Self Care Activities:  . Self administers medications as prescribed . Attends all scheduled provider appointments . Calls provider office for new concerns or questions  Initial goal documentation       The patient was offered Care Management services and declined at this time.   The Patient did agree for me to call and follow up with her to make sure she was doing well.  Lazaro Arms RN, BSN, [facility] Care Management Coordinator [place] Phone: [phone] Fax: [phone]

## 2019-09-14 NOTE — Patient Instructions (Signed)
Visit Information  Goals Addressed            This Visit's Progress   . " I still have some pain in my hip " (pt-stated)       CARE PLAN ENTRY (see longtitudinal plan of care for additional care plan information)  Current Barriers:  Marland Kitchen Knowledge Deficits related to acute pain of fracture proximal left femur . Chronic Disease Management support and education needs related to fracture of the greater trochanter of the proximal left femur  Nurse Case Manager Clinical Goal(s):  Marland Kitchen Over the next 14 days, patient will verbalize understanding of plan for after visit instructions from the ED  Interventions:  . Evaluation of current treatment plan related to after visit instructions from visit on 09/07/19 and patient's adherence to plan as established by provider. . Assessed the patient and she states that she is doing fair.  She is having some pain that she rates a 2/10.  She states that it can be more if she turn in certain positions.  . Advised patient to if she has increased pain, any fever,nausea or vomiting to return to ER immediately.  She voiced understanding.  . Reviewed medications with patient and discussed Pain medication at length.  The patient stated that she had not picked up her pain medication form the pharmacy yet but had call that is was ready for pick up.  She said she had pain medication from 2014 (Morphine) that she had not thrown away and taken that .  She wanted to know if that would be ok to take.  I advise her I could not give her an ok to take the medication it was an old medication and she should call her pharmacist before she took any more medicine.  She stated that she would. . Discussed plans with patient for ongoing care management follow up and provided patient with direct contact information for care management team.  Patient stated that she did not feel that she needed services at this time but would allow me to call and follow up with her. . Reviewed scheduled/upcoming  provider appointments including: Discussed  with the patient that she needs an appointment with Orthopedics.  She stated that she was finishing breakfast and would call to schedule an appointment this morning. . I asked the patient did she need for me to schedule an appointment with Dr. Andria Frames and she declined. She did not feel she needed an appointment at this time.   . She states that she has transportation to her appointments.  Patient Self Care Activities:  . Self administers medications as prescribed . Attends all scheduled provider appointments . Calls provider office for new concerns or questions  Initial goal documentation        Ms. Moxley was given information about Care Management services today including:  1. Care Management services include personalized support from designated clinical staff supervised by her physician, including individualized plan of care and coordination with other care providers 2. 24/7 contact phone numbers for assistance for urgent and routine care needs. 3. The patient may stop CCM services at any time (effective at the end of the month) by phone call to the office staff.  Patient agreed to services and verbal consent obtained.   The patient verbalized understanding of instructions provided today and declined a print copy of patient instruction materials.   The patient was offered Care Management services and declined at this time.   The Patient did agree for me  to call and follow up with her to make sure she was doing well.   Lazaro Arms RN, BSN, [facility] Care Management Coordinator [place] Phone: [phone] Fax: [phone]

## 2019-09-16 ENCOUNTER — Other Ambulatory Visit: Payer: Self-pay

## 2019-09-16 ENCOUNTER — Inpatient Hospital Stay (HOSPITAL_BASED_OUTPATIENT_CLINIC_OR_DEPARTMENT_OTHER): Payer: Medicare Other | Admitting: Medical

## 2019-09-16 ENCOUNTER — Inpatient Hospital Stay: Payer: Medicare Other | Attending: Medical

## 2019-09-16 ENCOUNTER — Inpatient Hospital Stay: Payer: Medicare Other

## 2019-09-16 VITALS — BP 114/66 | HR 89 | Temp 97.8°F | Resp 16 | Ht 67.0 in | Wt 119.1 lb

## 2019-09-16 DIAGNOSIS — Z7982 Long term (current) use of aspirin: Secondary | ICD-10-CM | POA: Diagnosis not present

## 2019-09-16 DIAGNOSIS — Z923 Personal history of irradiation: Secondary | ICD-10-CM | POA: Insufficient documentation

## 2019-09-16 DIAGNOSIS — M25462 Effusion, left knee: Secondary | ICD-10-CM | POA: Diagnosis not present

## 2019-09-16 DIAGNOSIS — M810 Age-related osteoporosis without current pathological fracture: Secondary | ICD-10-CM | POA: Insufficient documentation

## 2019-09-16 DIAGNOSIS — Z9481 Bone marrow transplant status: Secondary | ICD-10-CM | POA: Diagnosis not present

## 2019-09-16 DIAGNOSIS — D61818 Other pancytopenia: Secondary | ICD-10-CM | POA: Diagnosis not present

## 2019-09-16 DIAGNOSIS — Z79899 Other long term (current) drug therapy: Secondary | ICD-10-CM | POA: Insufficient documentation

## 2019-09-16 DIAGNOSIS — G629 Polyneuropathy, unspecified: Secondary | ICD-10-CM | POA: Insufficient documentation

## 2019-09-16 DIAGNOSIS — Z5112 Encounter for antineoplastic immunotherapy: Secondary | ICD-10-CM | POA: Diagnosis not present

## 2019-09-16 DIAGNOSIS — Z9221 Personal history of antineoplastic chemotherapy: Secondary | ICD-10-CM | POA: Insufficient documentation

## 2019-09-16 DIAGNOSIS — C9002 Multiple myeloma in relapse: Secondary | ICD-10-CM | POA: Insufficient documentation

## 2019-09-16 DIAGNOSIS — Z7952 Long term (current) use of systemic steroids: Secondary | ICD-10-CM | POA: Diagnosis not present

## 2019-09-16 DIAGNOSIS — M25562 Pain in left knee: Secondary | ICD-10-CM | POA: Diagnosis not present

## 2019-09-16 DIAGNOSIS — I252 Old myocardial infarction: Secondary | ICD-10-CM | POA: Insufficient documentation

## 2019-09-16 DIAGNOSIS — C9000 Multiple myeloma not having achieved remission: Secondary | ICD-10-CM

## 2019-09-16 DIAGNOSIS — R739 Hyperglycemia, unspecified: Secondary | ICD-10-CM | POA: Insufficient documentation

## 2019-09-16 LAB — CBC WITH DIFFERENTIAL (CANCER CENTER ONLY)
Abs Immature Granulocytes: 0.03 10*3/uL (ref 0.00–0.07)
Basophils Absolute: 0.1 10*3/uL (ref 0.0–0.1)
Basophils Relative: 1 %
Eosinophils Absolute: 0.1 10*3/uL (ref 0.0–0.5)
Eosinophils Relative: 2 %
HCT: 25.9 % — ABNORMAL LOW (ref 36.0–46.0)
Hemoglobin: 8.4 g/dL — ABNORMAL LOW (ref 12.0–15.0)
Immature Granulocytes: 1 %
Lymphocytes Relative: 5 %
Lymphs Abs: 0.2 10*3/uL — ABNORMAL LOW (ref 0.7–4.0)
MCH: 38 pg — ABNORMAL HIGH (ref 26.0–34.0)
MCHC: 32.4 g/dL (ref 30.0–36.0)
MCV: 117.2 fL — ABNORMAL HIGH (ref 80.0–100.0)
Monocytes Absolute: 0.5 10*3/uL (ref 0.1–1.0)
Monocytes Relative: 13 %
Neutro Abs: 3 10*3/uL (ref 1.7–7.7)
Neutrophils Relative %: 78 %
Platelet Count: 216 10*3/uL (ref 150–400)
RBC: 2.21 MIL/uL — ABNORMAL LOW (ref 3.87–5.11)
RDW: 16.7 % — ABNORMAL HIGH (ref 11.5–15.5)
WBC Count: 3.9 10*3/uL — ABNORMAL LOW (ref 4.0–10.5)
nRBC: 0 % (ref 0.0–0.2)

## 2019-09-16 LAB — CMP (CANCER CENTER ONLY)
ALT: 13 U/L (ref 0–44)
AST: 19 U/L (ref 15–41)
Albumin: 3.6 g/dL (ref 3.5–5.0)
Alkaline Phosphatase: 76 U/L (ref 38–126)
Anion gap: 10 (ref 5–15)
BUN: 19 mg/dL (ref 8–23)
CO2: 23 mmol/L (ref 22–32)
Calcium: 8.7 mg/dL — ABNORMAL LOW (ref 8.9–10.3)
Chloride: 104 mmol/L (ref 98–111)
Creatinine: 0.78 mg/dL (ref 0.44–1.00)
GFR, Est AFR Am: >60 mL/min (ref >60)
GFR, Estimated: >60 mL/min (ref >60)
Glucose, Bld: 97 mg/dL (ref 70–99)
Potassium: 4.2 mmol/L (ref 3.5–5.1)
Sodium: 137 mmol/L (ref 135–145)
Total Bilirubin: 0.8 mg/dL (ref 0.3–1.2)
Total Protein: 5.7 g/dL — ABNORMAL LOW (ref 6.5–8.1)

## 2019-09-16 LAB — CK TOTAL AND CKMB (NOT AT ARMC)
CK, MB: 3.4 ng/mL (ref 0.5–5.0)
Relative Index: INVALID (ref 0.0–2.5)
Total CK: 52 U/L (ref 38–234)

## 2019-09-16 MED ORDER — PALONOSETRON HCL INJECTION 0.25 MG/5ML
INTRAVENOUS | Status: AC
  Filled 2019-09-16: qty 5

## 2019-09-16 MED ORDER — SODIUM CHLORIDE 0.9 % IV SOLN
Freq: Once | INTRAVENOUS | Status: AC
  Filled 2019-09-16: qty 250

## 2019-09-16 MED ORDER — PALONOSETRON HCL INJECTION 0.25 MG/5ML
0.2500 mg | Freq: Once | INTRAVENOUS | Status: AC
  Administered 2019-09-16: 0.25 mg via INTRAVENOUS

## 2019-09-16 MED ORDER — DEXAMETHASONE 4 MG PO TABS
20.0000 mg | ORAL_TABLET | Freq: Once | ORAL | Status: AC
  Administered 2019-09-16: 20 mg via ORAL

## 2019-09-16 MED ORDER — DEXAMETHASONE 4 MG PO TABS
ORAL_TABLET | ORAL | Status: AC
  Filled 2019-09-16: qty 5

## 2019-09-16 MED ORDER — DEXTROSE 5 % IV SOLN
69.0000 mg/m2 | Freq: Once | INTRAVENOUS | Status: AC
  Administered 2019-09-16: 110 mg via INTRAVENOUS
  Filled 2019-09-16: qty 30

## 2019-09-16 NOTE — Progress Notes (Signed)
[place]   [phone] [phone]   Clinic Follow up Note   Patient Care Team: [name], MD as PCP - General (Family Medicine) [name], MD as PCP - Cardiology (Cardiology) [name], MD as Attending Physician (Obstetrics and Gynecology) [name], MD as Consulting Physician (Internal Medicine) [name], MD as Consulting Physician (Hematology) [name], MD as Consulting Physician (Ophthalmology) [name], MD as Consulting Physician (Cardiology) [name], MD as Consulting Physician (Dermatology) [name] (Dentistry) [name], DPM as Consulting Physician (Podiatry) Lazaro Arms, RN as [place] Management Lazaro Arms, RN as Case Manager  Date of Service:  09/23/2019  CHIEF COMPLAINT: F/u of MM  SUMMARY OF ONCOLOGIC HISTORY: Oncology History Overview Note  Multiple myeloma [facility])   Staging form: Multiple Myeloma, AJCC 6th Edition   - Clinical: Stage IIIA - Signed by [name], MD on 09/04/2013    Multiple myeloma ([place])  05/26/2012 Initial Diagnosis   Presenting IgG was 4,040 mg/dL on 06/05/2012 (IgA 35; IgM 34); kappa free light chain 34.4 mg/dL, lambda 0.00, kappa:lambda ratio of 34.75; SPEP with M-spike of 2.60.   06/30/2012 Imaging   Numerous lytic lesions throughout the calvarium.  Lytic lesion within the left lateral scapula.  Findings most compatible with metastases or myeloma. Multiple mid and lower thoracic compression fractures.  Slight compression through the endplates at L3.   97/08/6376 Bone Marrow Biopsy   Bone marrow biopsy showed 49% plasma cell. Normal classical cytogenetics; however, myeloma FISH panel showed 13q- (intermediate risk)       07/14/2012 - 07/27/2012 Radiation Therapy   Palliative radiation 20 Gy over 10 fractions between to thoracic spine cord compression and symptomatic left scapula lytic lesion.   08/10/2012  - 11/2012 Chemotherapy   Started SQ Velcade once weekly, 3 weeks on, 1 weeks off; daily Revlimid d1-21, 7 days off; and Dexamethasone 77m PO weekly.    02/12/2013 Bone Marrow Transplant   [facility]    06/14/2013 Tumor Marker   IgG  1830,  Kappa:lamba ratio, 1.69 (baseline was 34.75 or   M-spike 0.28 (baseline of 2.6 or  89.3% of baseline).     06/25/2013 -  Chemotherapy   Started zometa 3.5 mg monthly    09/01/2013 - 01/2014 Chemotherapy   Maintenance therapy with revlimid 545mdaily for 14 days and then off for 7 (decreased from 21 days on and 7 days off based on neutropenia). Stopped due to disease progression 01/2017   09/13/2013 - 09/17/2014 Hospital Admission   Hospital admission for pneumonia   12/20/2013 Treatment Plan Change   Maintenance therapy decreased to 2.5 mg daily for 21 days and then off for 7 days based on low counts.    01/25/2014 Tumor Marker   IgG 1360 M spike 0.5   03/01/2014 - 03/05/2014 Hospital Ad[facility] received thromb[facility] therapy, cath showed 60% prox RCA, mid-distal RCA 50%, Echo normal, no stents.   04/11/2014 - 08/07/2014 Chemotherapy   Continue Revlimid at 2.5 mg 3 weeks on 1 week off   08/08/2014 - 08/13/2014 Hospital Admission   Hospital admission for pneumonia. Her Revlimid was held.   08/29/2014 - 09/14/2014 Chemotherapy   Maintenance Revlimid restarted   09/14/2014 - 09/17/2014 Hospital Admission   [place].   11/13/2014 - 12/25/2015 Chemotherapy  Maintenance Revlimid restarted, changed to 2.41m daily, 2 weks on, 1 week off from 12/15/2014, stopped due to disease progression    01/24/2016 Progression   Patient is M protein has gradually increased to 1.0g, repeated a bone marrow biopsy showed plasma cell 10-30%   02/27/2016 - 03/07/2016 Chemotherapy   Pomalidomide 4 mg daily on day 1-21,  dexamethasone 40 mg on day 1, 8 and 15, every 28 days, started on 02/27/2016, daratumumab weekly started on 03/07/2016, held after first dose, due to hospitalization and a severe pancytopenia, pomalidomide was stopped afterwards    03/10/2016 - 03/29/2016 Hospital Admission   Pt was admitted for sepsis from pneumonia, and severe pancytopenia. She required ICU stay for a few days due to hypotension, developed b/l pleural effusion required diuretics and b/l thoracentesis. She also required blood and plt transfusion, and prolonged neupogen injection for severe neutropenia. She was discharged home after 3 weeks hospital stay    04/26/2016 - 04/26/2019 Chemotherapy   Daratumumab restarted on 04/26/2016, Velcade 1.356mm2 and dexa 2079meekly start on 11/3, velcade held since 06/28/2016 due to CHF, dexa stopped on12/11/2017 due to high risk of fracture.  Dara reduced to every 4 weeks due to COVID-19 starting 10/23/18. Returned to every 3 weeks starting 12/17/18. Restarted every 2 weeks starting 02/22/19 Stopped on 04/26/19 due to disease progression.    08/26/2016 Echocardiogram   - Left ventricle: LVEF is approximately 35% with diffuse diffuse   hypokinesis with severe hypokinesis/ akinesis of the   inferior/inferoseptal walls. In comparison to echo images from   December 2017, there does not appear to be significant change The   cavity size was normal. Wall thickness was normal. Doppler   parameters are consistent with abnormal left ventricular   relaxation (grade 1 diastolic dysfunction). - Aortic valve: AV is thickened. 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 Present in echo from December 2017 There was   trivial regurgitation. - Right ventricle: Systolic function was mildly reduced. - Right atrium: The atrium was mildly dilated. - Pericardium, extracardiac: A trivial pericardial effusion was   identified.   12/03/2016 Echocardiogram    -Left Ventricle: Systolic function was   mildly to moderately reduced. The estimated ejection fraction was   in the range of 40% to 45%. Diffuse hypokinesis. Doppler   parameters are consistent with abnormal left ventricular   relaxation (grade 1 diastolic dysfunction). Doppler parameters   are consistent with indeterminate ventricular filling pressure. - Left atrium: The atrium was severely dilated. - Tricuspid valve: There was trivial regurgitation. - Pulmonary arteries: Systolic pressure was within the normal   range. PA peak pressure: 33 mm Hg (S). - Global longitudinal strain -10.5% (abnormal)   03/12/2017 Echocardiogram   ECHO 03/12/17 Study Conclusions - Left ventricle: The cavity size was normal. Wall thickness was   increased in a pattern of mild LVH. Systolic function was normal.   The estimated ejection fraction was in the range of 50% to 55%.   There is hypokinesis of the basalinferior myocardium. Doppler   parameters are consistent with abnormal left ventricular   relaxation (grade 1 diastolic dysfunction). - Aortic valve: There was trivial regurgitation. - Mitral valve: Calcified annulus. Mildly thickened leaflets .   There was trivial regurgitation. - Tricuspid valve: There was mild regurgitation. Impressions: - There has been mild improvement in EF since prior study.    12/12/2017 - 12/15/2017 Hospital Admission   Admission diagnosis: Pnuemonia and acute hypoxic  respiratory failure Additional comments: treated for pneumoania and acute hypoxic respiratory failure before she was discharged on 12/15/17 with oral anitbiotics. She did not require home oxygen.    12/30/2017 - 01/02/2018 Hospital Admission   Admission diagnosis: Pnuemonia  Additional comments: She was again hospitalized on 12/30/17 for pneumonia, ID work up was otherwise negative, she was discharged home with oral antibiotics.    04/01/2018 Imaging   04/01/2018 CXR IMPRESSION: Persisting airspace disease  superimposed on chronic changes, suggesting pneumonia given the history. Followup PA and lateral chest X-ray is recommended in 3-4 weeks following therapy to ensure resolution and exclude underlying malignancy.  Redemonstration of mid and lower thoracic compression fractures with associated thoracic kyphosis.   04/23/2018 Echocardiogram   04/23/2018 ECHO LV EF: 45% -   50%   01/07/2019 Imaging   MRI Brain 01/07/19 IMPRESSION: MRI HEAD IMPRESSION:   1. 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 Lesion closely approximates the left inner ear structures and involves the left Meckel's cave and left fifth cranial nerve, and most likely is the causative etiology for patient's underlying left ear and facial symptoms. Follow-up examination with postcontrast imaging suggested for complete evaluation. Inclusion of IAC sequences would likely be helpful for visualization. 2. Additional 2.7 cm lesion involving the right occipital calvarium with adjacent marrow signal abnormality, also likely related to multiple myeloma. Metastatic disease would be the primary differential consideration. 3. Underlying age-appropriate cerebral atrophy with mild chronic microvascular ischemic disease. No other acute intracranial abnormality.   MRA HEAD IMPRESSION:   1. Negative intracranial MRA with no large vessel occlusion, hemodynamically significant stenosis, or other acute vascular abnormality. 2. Petrous and cavernous left ICA is partially encased and surrounded by the left petrous apex lesion without significant irregularity or stenosis. 3. 2 mm right paraophthalmic aneurysm.   MRA NECK IMPRESSION:   Normal MRA of the neck with wide patency of the carotid and vertebral arteries bilaterally. No hemodynamically significant stenosis or other acute vascular abnormality  identified.    01/14/2019 - 01/27/2019 Radiation Therapy   By Dr. Tammi Klippel.    Diagnosis:   79 yo woman with skull base myeloma lesion causing left ear pain      Indication for treatment:  Palliative        Radiation treatment dates:   01/14/2019 - 01/27/2019   Site/dose:   Skull Base / 20 Gy in 10 fractions   Beams/energy:   3D, photons / 10X, 6X   04/01/2019 Pathology Results   Bone Marrow biopsy 04/01/19  DIAGNOSIS: BONE MARROW, ASPIRATE, CLOT, CORE: - Normocellular marrow with minimal involvement by plasma cell neoplasm - See comment PERIPHERAL BLOOD: - Macrocytic anemia and leukopenia -See complete blood count   04/09/2019 PET scan   PET 04/09/19  IMPRESSION: 1. Scattered lytic lesions of bone. A few of these demonstrate hypermetabolic activity, most notably the right occipital bone lesion; the lytic lesion in the approximately C7 spinous process; the lytic expansile lesion of the left fourth rib anteriorly; and a small lytic focus in the right femoral head. Other scattered lytic lesions have only low-grade activity. 2. Multiple fractures as detailed above. 3. Other imaging findings of potential clinical significance: Aortic Atherosclerosis (ICD10-I70.0). Coronary atherosclerosis.   05/10/2019 -  Chemotherapy   -CyBorD (cyclophosphamide (CYTOXAN) '500mg'$  ('300mg'$ /m2), Velcade 1.'3mg'$ /m2, and dexa '20mg'$ , weekly) starting 05/24/19.   -Due to neuropathy after 2 doses of Velcade, I  switched to weekly Kyprolis 3 weeks on/1 week off on 06/14/19.   -Chemo held for RT 07/05/19-07/22/19.   -Dexa stopped and Cytoxan dose reduced to 400 mg on day 1, 8 and 15 every 28 days, from cycle 3.     07/05/2019 - 07/20/2019 Radiation Therapy   By Dr. Tammi Klippel   Diagnosis:   79 yo woman with painful right occipital and left hip involvement by multiple myeloma      Indication for treatment:  Palliation        Radiation treatment dates:   07/05/19-07/20/19   Site/dose:    1.  The Right Occipital skull  lesion was treated to 20 Gy in 20 fractions of 2 Gy 2.  The Left Hip lesion was treated to 20 Gy in 20 fractions of 2 Gy   Beams/energy:      1.  The Right Occipital skull lesion was treated using a 3-field 3D set-up 2.  The Left Hip lesion was treated anterior and posterior fields.      CURRENT THERAPY:  -CyBorD (cyclophosphamide(CYTOXAN)59m (3062mm2),Velcade 1.30m5m2, and dexa 9m55meekly) starting 05/24/19. -Due toneuropathyafter 2 doses of Velcade, I switched toweeklyKyprolis 3 weeks on/1 week off on 06/14/19.Reduced to 2 weeks on/1 week off starting 09/16/19 due to tolerance.  -Chemo heldfor RT 07/05/19-07/22/19. -Dexareduced to 9mg81mytoxan dose reduced to 400 mg on day 1, 8 and 15 every 28 days, from cycle 3 on 08/19/19, Cytoxan reduced to 300mg 2mr 09/02/19.  -Restart Zometa injections every6 weekson 06/21/19.   INTERVAL HISTORY:  ClaireAYN Rush for a follow up and treatment. She presents to the clinic alone. She notes left hip pain from her recently found periprosthetic fracture is very mild and occurs when she walks. She uses tylenol as needed. She has quick release Morphine half tablet which she can use for significant pain. She has walker but only uses it as needed. She has stable skin bruising and denies overt bleeding. She continues to tolerate treatment well and stable. She will be mostly fatigued the first few days to 1 week.  [place] trip [place] in [place]     REVIEW OF SYSTEMS:   Constitutional: Denies fevers, chills or abnormal weight loss Eyes: Denies blurriness of vision Ears, nose, mouth, throat, and face: Denies mucositis or sore throat Respiratory: Denies cough, dyspnea or wheezes Cardiovascular: Denies palpitation, chest discomfort or lower extremity swelling Gastrointestinal:  Denies nausea, heartburn or change in bowel habits Skin: Denies abnormal skin rashes MSK: (+)  Left hip periprosthetic fracture, pain Lymphatics: Denies new lymphadenopathy (+) easy bruising Neurological:Denies numbness, tingling or new weaknesses Behavioral/Psych: Mood is stable, no new changes  All other systems were reviewed with the patient and are negative.  MEDICAL HISTORY:  Past Medical History:  Diagnosis Date  . CAD (coronary artery disease)    a[place] [facility] [place] mid to dist RCA 50% >>> med rx  . Cardiomyopathy (HCC)  [place]   . Chronic combined systolic and diastolic CHF (congestive heart failure) (HCC)  [place]   . Cord compression (HCC) 1[place]/13   MRI- diffuse myeloma involvement of T-L spine  . History of radiation therapy 07/13/12-07/27/12   spinal cord compression T3-T10,left scapula  . 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 Multiple myeloma (HCC) 1[place]8/2013  . MVP (mitral valve prolapse)    a. trivial by echo  06/2017.  . OSTEOPOROSIS 06/11/2010   Multiple compression fractures; and spontaneous fracture of sternum Qualifier: Diagnosis of  By: [name], [name]     . Thoracic kyphosis 07/13/12   per MRI scan  . Unspecified deficiency anemia     SURGICAL HISTORY: Past Surgical History:  Procedure Laterality Date  . APPENDECTOMY    . CATARACT EXTRACTION, BILATERAL    . CESAREAN SECTION     x2   . COLONOSCOPY  2007   neg with Dr. [name]  . ELBOW SURGERY    . HIP SURGERY  2009   left  . TUBAL LIGATION      I have reviewed the social history and family history with the patient and they are unchanged from previous note.  ALLERGIES:  is allergic to zithromax [azithromycin]; zosyn [piperacillin sod-tazobactam so]; and quinolones.  MEDICATIONS:  Current Outpatient Medications  Medication Sig Dispense Refill  . acetaminophen (TYLENOL) 500 MG tablet Take 500 mg by mouth every 6 (six) hours as needed for moderate pain or fever.    Marland Kitchen acyclovir (ZOVIRAX) 400 MG  tablet Take 1 tablet (400 mg total) by mouth 2 (two) times daily. 180 tablet 3  . aspirin EC 81 MG tablet Take 1 tablet (81 mg total) by mouth daily. 90 tablet 3  . atorvastatin (LIPITOR) 20 MG tablet Take 1 tablet (20 mg total) by mouth daily. (Patient taking differently: Take 20 mg by mouth at bedtime. ) 10 tablet 0  . calcium-vitamin D (OSCAL WITH D) 500-200 MG-UNIT TABS tablet Take by mouth.    . cyclophosphamide (CYTOXAN) 50 MG capsule Take 8 capsules (400 mg) by mouth with breakfast once weekly, 3 weeks on and one week off. Take with food to minimize GI upset. Take early in the day and maintain hydration. (Patient taking differently: Take 400 mg by mouth See admin instructions. Take 8 capsules (400 mg) by mouth with breakfast once weekly, 3 weeks on and one week off. Take with food to minimize GI upset. Take early in the day and maintain hydration.) 24 capsule 1  . dexamethasone (DECADRON) 4 MG tablet Take 5 tablets (20 mg total) by mouth once a week. Take on the day of chemo 20 tablet 3  . doxycycline (VIBRA-TABS) 100 MG tablet Take 100 mg by mouth as needed (treatment for pneumonia).     . metoprolol succinate (TOPROL-XL) 25 MG 24 hr tablet Take 1 tablet (25 mg total) by mouth daily. 15 tablet 0  . morphine (MSIR) 15 MG tablet Take 0.5 tablets (7.5 mg total) by mouth every 4 (four) hours as needed for severe pain. 4 tablet 0  . ondansetron (ZOFRAN) 8 MG tablet Take 1 tablet (8 mg total) by mouth every 8 (eight) hours as needed for nausea or vomiting. 20 tablet 3  . prochlorperazine (COMPAZINE) 10 MG tablet Take 1 tablet (10 mg total) by mouth every 6 (six) hours as needed (Nausea or vomiting). 30 tablet 1   No current facility-administered medications for this visit.    PHYSICAL EXAMINATION: ECOG PERFORMANCE STATUS: 2 - Symptomatic, <50% confined to bed  Vitals:   09/23/19 1307  BP: 116/63  Pulse: 89  Resp: 18  Temp: 98.3 F (36.8 C)  SpO2: 100%   Filed Weights   09/23/19 1307   Weight: 117 lb 4.8 oz (53.2 kg)    Due to COVID19 we will limit examination to appearance. Patient had no complaints.  GENERAL:alert, no distress and comfortable SKIN: skin color normal, no rashes or  significant lesions (+) Skin bruising of b/l arms  EYES: normal, Conjunctiva are pink and non-injected, sclera clear  NEURO: alert & oriented x 3 with fluent speech   LABORATORY DATA:  I have reviewed the data as listed CBC Latest Ref Rng & Units 09/23/2019 09/16/2019 09/02/2019  WBC 4.0 - 10.5 K/uL 3.3(L) 3.9(L) 3.3(L)  Hemoglobin 12.0 - 15.0 g/dL 9.1(L) 8.4(L) 9.0(L)  Hematocrit 36.0 - 46.0 % 27.6(L) 25.9(L) 27.5(L)  Platelets 150 - 400 K/uL 89(L) 216 77(L)     CMP Latest Ref Rng & Units 09/23/2019 09/16/2019 09/02/2019  Glucose 70 - 99 mg/dL 98 97 73  BUN 8 - 23 mg/dL _0 Creatinine 0.44 - 1.00 mg/dL 0.78 0.78 0.67  Sodium 135 - 145 mmol/L 141 137 136  Potassium 3.5 - 5.1 mmol/L 4.0 4.2 4.0  Chloride 98 - 111 mmol/L 109 104 105  CO2 22 - 32 mmol/L _1 Calcium 8.9 - 10.3 mg/dL 8.5(L) 8.7(L) 8.6(L)  Total Protein 6.5 - 8.1 g/dL 5.6(L) 5.7(L) 6.1(L)  Total Bilirubin 0.3 - 1.2 mg/dL 0.8 0.8 0.8  Alkaline Phos 38 - 126 U/L 77 76 76  AST 15 - 41 U/L _2 ALT 0 - 44 U/L _3 RADIOGRAPHIC STUDIES: I have personally reviewed the radiological images as listed and agreed with the findings in the report. No results found.   ASSESSMENT & PLAN:  Shannon Rush is a 79 y.o. female with     1. IgG kappa Multiple myeloma s/p Auto SCT, intermediate risk, relapsed in 7/2017and progressed again in Oct 2020 -Diagnosed in 05/2012. Treated with induction chemo, radiation and BM transplant, but unfortunately progressed while on maintenance Revlimid 01/2016. She is currently onDaratumumabinfusion monotherapy.  -PETimagesfrom 04/09/19 showedScattered lytic lesions of bone. A few of these demonstrate hypermetabolic activity, most notably the right occipital bone lesion,  C7 spinous process, Left 4th rib, andright femoral head.  -Herbone marrow biopsy from 04/02/19 showsnormocellular marrow with minimal involvement by plasma cell neoplasm(2%). -unfortunately she has progressed previously with increased M-protein and more symptomatic bone pain from MM, s/p palliative radiation to left hip and right scalp -Based on Dr. Noah Delaine recommendation I changedher treatment to CyBorD (oral cyclophosphamide 536m, Velcade 1.32mm2, and dexa 2040mweekly) started on11/9/20.After 2 doses of Velcade, I switched toweeklyKyprolis3 weeks on/1 week offstarting 06/14/19 due to neuropathy.Chemo waspreviouslyheld forpalliative RT.Kyprolis reduced to 2 weeks on/1 week off starting 09/16/19 due to tolerance.  -Cytoxan has been dose reduced and held as needed due to thrombocytopenia, currently on 300m65mekly.  -Given frequent infusions I offered her PAC placement, she declined.  -She is mostly clinically stable overall. She does have recently found left hip periprosthetic fracture. Pain is manageable. Labs reviewed, WBC 3.3, Hg 9.1, plt 89K. Overall adequate to proceed with Kyprolis.  -f/u in 2 weeks  -She has received both her COVID19 Vaccines   2.Left hip pain and recent fracture 09/07/19 -she had left hip surgery with rod in 2009  -She developed new left hip pain on weightbearingIn Dec 2020, no painat resting -She received target RT 07/05/19-07/20/19. Now her pain is nearly resolved, minimalnow -She was seen to have left periprosthetic hip fracture when she went to ED on 09/07/19. She is able to bear some weight with pain 1/10. She takes Tylenol but has quick release Morphine half tablet for significant pain.  -She has walker which she only uses as needed.    3. Left  petrous apex bone lesion with hearing lossandRight OccipitalskullLesion  -Her MRI brain from 01/07/19 showedabnormal osseous lesion measuring up to approximately 5.9 cm centered at the left  petrous apex. Thismost likely is the cause of her underlyingleft ear and balancesymptoms. -Given she is symptomatic, sheunderwent target RTto skull case myeloma lesionwith Dr Tammi Klippel 7/1-7/15/20. This is negative on9/25/20PET scan -Her PET from 04/09/19 shows lesion with hypermetabolic activity in this area concerning for MM progression here.She was symptomatic. -She recently completedRT to right occipital lesion per Dr. Manning12/21/20-07/20/19.Tolerated well.She has moderate hair loss at RT site, her dizziness improved but muffled hearing is the same.  -overall stable   4. History of multiple pneumonia andbronchitis -She has had multiple episodes of pneumonia, required hospitalization,and bronchitis, required antibiotics -She has recovered well, will continue monitoring. I encouraged her to do spirometry,and exercise. -Her vaccines are up-to-date. She has received COVID vaccine lately   5. Pancytopenia -She mainly has anemia and Thrombocytopenia concerningwhich occurred with recent(03/2019) disease progression. -Did give bloodtransfusionon 06/04/19  -Has recurred with oral Cytoxan. I encouraged her to avoid injury or fall given thrombocytopenia  6. CHF, CAD, inferior STEMI in 02/2014, HLD -She is on ASA, BB, statin. She will continue to f/u with Dr. Meda Coffee -BPwell controlled and normal lately. -10/2020echo showed normal EF, will continue monitoring   7. Osteoporosis with multiple fractures -Last DEXA in 2015 showed worsening osteoporosis. She declined repeat DEXA.  -She had a fall induced fracture in 01/2018. -She was on Zometa for 4 years, stopped after April 2018  -I restarted herZometaq6weeksto help strengthen her boneon12/7/20. Will monitor her dental health -She was seen to have left periprosthetic hip fracture when she went to ED on 09/07/19.   8. Peripheral neuropathy in feet, G2 -Secondary to previous chemo. -She previously declined  Gabapentin -Worsenedsince she started Velcade. Now she is onKyprolis since 11/20/21which cause less neuropathy.Stable.  9. Goal of care discussion  -she is full code now  10Steroid induced hyperglycemia -Secondary to steroids,her random blood glucose was 217on 06/07/19. Has much improved recently. -I encouraged her to drink plenty of water and reduce sugar and carbohydrate in diet.  -06/21/19 A1c at 6.3. -Dexamethasone has been reduced to 20 mg weekly, BG has been normal lately    Plan -Labs reviewed and adequate to proceed with cytoxan, dexa,Kyprolis today  -Due to thrombocytopenia, will change her treatment to day 1, 8 every 21 days  -continue weekly dexa 50m before chemo -F/uin 2weeks -nextZometa injection on 3/4   No problem-specific Assessment & Plan notes found for this encounter.   No orders of the defined types were placed in this encounter.  All questions were answered. The patient knows to call the clinic with any problems, questions or concerns. No barriers to learning was detected. The total time spent in the appointment was 30 minutes.     YTruitt Merle MD 09/23/2019   I, AJoslyn Devon am acting as scribe for YTruitt Merle MD.   I have reviewed the above documentation for accuracy and completeness, and I agree with the above.

## 2019-09-16 NOTE — Patient Instructions (Signed)

## 2019-09-16 NOTE — Patient Instructions (Signed)
[facility] Discharge Instructions for Patients Receiving Chemotherapy  Today you received the following chemotherapy agents:  Kyprolis  To help prevent nausea and vomiting after your treatment, we encourage you to take your nausea medication as directed.   If you develop nausea and vomiting that is not controlled by your nausea medication, call the clinic.   BELOW ARE SYMPTOMS THAT SHOULD BE REPORTED IMMEDIATELY:  *FEVER GREATER THAN 100.5 F  *CHILLS WITH OR WITHOUT FEVER  NAUSEA AND VOMITING THAT IS NOT CONTROLLED WITH YOUR NAUSEA MEDICATION  *UNUSUAL SHORTNESS OF BREATH  *UNUSUAL BRUISING OR BLEEDING  TENDERNESS IN MOUTH AND THROAT WITH OR WITHOUT PRESENCE OF ULCERS  *URINARY PROBLEMS  *BOWEL PROBLEMS  UNUSUAL RASH Items with * indicate a potential emergency and should be followed up as soon as possible.  Feel free to call the clinic should you have any questions or concerns. The clinic phone number is [phone].  Please show the CHEMO ALERT CARD at check-in to the Emergency Department and triage nurse.   

## 2019-09-17 NOTE — Progress Notes (Signed)
[place]   [phone] [phone]) [phone]   Clinic Follow up Note   Patient Care Team: [name], MD as PCP - General (Family Medicine) [name], MD as PCP - Cardiology (Cardiology) [name], MD as Attending Physician (Obstetrics and Gynecology) [name], MD as Consulting Physician (Internal Medicine) [name], MD as Consulting Physician (Hematology) [name], MD as Consulting Physician (Ophthalmology) [name], MD as Consulting Physician (Cardiology) [name], MD as Consulting Physician (Dermatology) [name] (Dentistry) [name], DPM as Consulting Physician (Podiatry) Lazaro Arms, RN as [facility] Lazaro Arms, RN as Case Manager  Date of Service:  09/17/2019  CHIEF COMPLAINT: H/O multiple myeloma with presentation for cycle #4, day #1 of chemotherapy.  SUMMARY OF ONCOLOGIC HISTORY: Oncology History Overview Note  Multiple myeloma ([place])   Staging form: Multiple Myeloma, AJCC 6th Edition   - Clinical: Stage IIIA - Signed by [name], MD on 09/04/2013    Multiple myeloma ([place])  05/26/2012 Initial Diagnosis   Presenting IgG was 4,040 mg/dL on 06/05/2012 (IgA 35; IgM 34); kappa free light chain 34.4 mg/dL, lambda 0.00, kappa:lambda ratio of 34.75; SPEP with M-spike of 2.60.   06/30/2012 Imaging   Numerous lytic lesions throughout the calvarium.  Lytic lesion within the left lateral scapula.  Findings most compatible with metastases or myeloma. Multiple mid and lower thoracic compression fractures.  Slight compression through the endplates at L3.   17/40/8144 Bone Marrow Biopsy   Bone marrow biopsy showed 49% plasma cell. Normal classical cytogenetics; however, myeloma FISH panel showed 13q- (intermediate risk)       07/14/2012 - 07/27/2012 Radiation Therapy   Palliative radiation 20 Gy over 10 fractions between to thoracic spine cord  compression and symptomatic left scapula lytic lesion.   08/10/2012 - 11/2012 Chemotherapy   Started SQ Velcade once weekly, 3 weeks on, 1 weeks off; daily Revlimid d1-21, 7 days off; and Dexamethasone '40mg'$  PO weekly.    02/12/2013 Bone Marrow Transplant   [facility] bone marrow transplant at [facility]     06/14/2013 Tumor Marker   IgG  1830,  Kappa:lamba ratio, 1.69 (baseline was 34.75 or   M-spike 0.28 (baseline of 2.6 or  89.3% of baseline).     06/25/2013 -  Chemotherapy   Started zometa 3.5 mg monthly    09/01/2013 - 01/2014 Chemotherapy   Maintenance therapy with revlimid '5mg'$  daily for 14 days and then off for 7 (decreased from 21 days on and 7 days off based on neutropenia). Stopped due to disease progression 01/2017   09/13/2013 - 09/17/2014 Hospital Admission   Hospital admission for pneumonia   12/20/2013 Treatment Plan Change   Maintenance therapy decreased to 2.5 mg daily for 21 days and then off for 7 days based on low counts.    01/25/2014 Tumor Marker   IgG 1360 M spike 0.5   03/01/2014 - 03/05/2014 Hospital Admission   [facility] center with an inferior STEMI, received thrombolytic therapy, cath showed 60% prox RCA, mid-distal RCA 50%, Echo normal, no stents.   04/11/2014 - 08/07/2014 Chemotherapy   Continue Revlimid at 2.5 mg 3 weeks on 1 week off   08/08/2014 - 08/13/2014 Hospital Admission   Hospital admission for pneumonia. Her Revlimid was held.   08/29/2014 - 09/14/2014 Chemotherapy   Maintenance Revlimid restarted   09/14/2014 - 09/17/2014 Hospital Admission   Admitted for pneumonia after coming back from a trip in [name]. Revlimid was  held again.   11/13/2014 - 12/25/2015 Chemotherapy   Maintenance Revlimid restarted, changed to 2.60m daily, 2 weks on, 1 week off from 12/15/2014, stopped due to disease progression    01/24/2016 Progression   Patient is M protein has gradually increased to 1.0g, repeated a bone marrow biopsy showed plasma cell 10-30%   02/27/2016 -  03/07/2016 Chemotherapy   Pomalidomide 4 mg daily on day 1-21, dexamethasone 40 mg on day 1, 8 and 15, every 28 days, started on 02/27/2016, daratumumab weekly started on 03/07/2016, held after first dose, due to hospitalization and a severe pancytopenia, pomalidomide was stopped afterwards    03/10/2016 - 03/29/2016 Hospital Admission   Pt was admitted for sepsis from pneumonia, and severe pancytopenia. She required ICU stay for a few days due to hypotension, developed b/l pleural effusion required diuretics and b/l thoracentesis. She also required blood and plt transfusion, and prolonged neupogen injection for severe neutropenia. She was discharged home after 3 weeks hospital stay    04/26/2016 - 04/26/2019 Chemotherapy   Daratumumab restarted on 04/26/2016, Velcade 1.331mm2 and dexa 2066meekly start on 11/3, velcade held since 06/28/2016 due to CHF, dexa stopped on12/11/2017 due to high risk of fracture.  Dara reduced to every 4 weeks due to COVID-19 starting 10/23/18. Returned to every 3 weeks starting 12/17/18. Restarted every 2 weeks starting 02/22/19 Stopped on 04/26/19 due to disease progression.    08/26/2016 Echocardiogram   - Left ventricle: LVEF is approximately 35% with diffuse diffuse   hypokinesis with severe hypokinesis/ akinesis of the   inferior/inferoseptal walls. In comparison to echo images from   December 2017, there does not appear to be significant change The   cavity size was normal. Wall thickness was normal. Doppler   parameters are consistent with abnormal left ventricular   relaxation (grade 1 diastolic dysfunction). - Aortic valve: AV is thickened. 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 Present in echo from December 2017 There was   trivial regurgitation. - Right ventricle: Systolic function was mildly reduced. - Right atrium: The atrium was mildly dilated. - Pericardium, extracardiac: A trivial  pericardial effusion was   identified.   12/03/2016 Echocardiogram   -Left Ventricle: Systolic function was   mildly to moderately reduced. The estimated ejection fraction was   in the range of 40% to 45%. Diffuse hypokinesis. Doppler   parameters are consistent with abnormal left ventricular   relaxation (grade 1 diastolic dysfunction). Doppler parameters   are consistent with indeterminate ventricular filling pressure. - Left atrium: The atrium was severely dilated. - Tricuspid valve: There was trivial regurgitation. - Pulmonary arteries: Systolic pressure was within the normal   range. PA peak pressure: 33 mm Hg (S). - Global longitudinal strain -10.5% (abnormal)   03/12/2017 Echocardiogram   ECHO 03/12/17 Study Conclusions - Left ventricle: The cavity size was normal. Wall thickness was   increased in a pattern of mild LVH. Systolic function was normal.   The estimated ejection fraction was in the range of 50% to 55%.   There is hypokinesis of the basalinferior myocardium. Doppler   parameters are consistent with abnormal left ventricular   relaxation (grade 1 diastolic dysfunction). - Aortic valve: There was trivial regurgitation. - Mitral valve: Calcified annulus. Mildly thickened leaflets .   There was trivial regurgitation. - Tricuspid valve: There was mild regurgitation. Impressions: - There has been mild improvement in EF since prior study.    12/12/2017 - 12/15/2017  Hospital Admission   Admission diagnosis: Pnuemonia and acute hypoxic respiratory failure Additional comments: treated for pneumoania and acute hypoxic respiratory failure before she was discharged on 12/15/17 with oral anitbiotics. She did not require home oxygen.    12/30/2017 - 01/02/2018 Hospital Admission   Admission diagnosis: Pnuemonia  Additional comments: She was again hospitalized on 12/30/17 for pneumonia, ID work up was otherwise negative, she was discharged home with oral antibiotics.    04/01/2018  Imaging   04/01/2018 CXR IMPRESSION: Persisting airspace disease superimposed on chronic changes, suggesting pneumonia given the history. Followup PA and lateral chest X-ray is recommended in 3-4 weeks following therapy to ensure resolution and exclude underlying malignancy.  Redemonstration of mid and lower thoracic compression fractures with associated thoracic kyphosis.   04/23/2018 Echocardiogram   04/23/2018 ECHO LV EF: 45% -   50%   01/07/2019 Imaging   MRI Brain 01/07/19 IMPRESSION: MRI HEAD IMPRESSION:   1. 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 Lesion closely approximates the left inner ear structures and involves the left Meckel's cave and left fifth cranial nerve, and most likely is the causative etiology for patient's underlying left ear and facial symptoms. Follow-up examination with postcontrast imaging suggested for complete evaluation. Inclusion of IAC sequences would likely be helpful for visualization. 2. Additional 2.7 cm lesion involving the right occipital calvarium with adjacent marrow signal abnormality, also likely related to multiple myeloma. Metastatic disease would be the primary differential consideration. 3. Underlying age-appropriate cerebral atrophy with mild chronic microvascular ischemic disease. No other acute intracranial abnormality.   MRA HEAD IMPRESSION:   1. Negative intracranial MRA with no large vessel occlusion, hemodynamically significant stenosis, or other acute vascular abnormality. 2. Petrous and cavernous left ICA is partially encased and surrounded by the left petrous apex lesion without significant irregularity or stenosis. 3. 2 mm right paraophthalmic aneurysm.   MRA NECK IMPRESSION:   Normal MRA of the neck with wide patency of the carotid and vertebral arteries bilaterally. No  hemodynamically significant stenosis or other acute vascular abnormality identified.    01/14/2019 - 01/27/2019 Radiation Therapy   By Dr. Tammi Klippel.    Diagnosis:   79 yo woman with skull base myeloma lesion causing left ear pain      Indication for treatment:  Palliative        Radiation treatment dates:   01/14/2019 - 01/27/2019   Site/dose:   Skull Base / 20 Gy in 10 fractions   Beams/energy:   3D, photons / 10X, 6X   04/01/2019 Pathology Results   Bone Marrow biopsy 04/01/19  DIAGNOSIS: BONE MARROW, ASPIRATE, CLOT, CORE: - Normocellular marrow with minimal involvement by plasma cell neoplasm - See comment PERIPHERAL BLOOD: - Macrocytic anemia and leukopenia -See complete blood count   04/09/2019 PET scan   PET 04/09/19  IMPRESSION: 1. Scattered lytic lesions of bone. A few of these demonstrate hypermetabolic activity, most notably the right occipital bone lesion; the lytic lesion in the approximately C7 spinous process; the lytic expansile lesion of the left fourth rib anteriorly; and a small lytic focus in the right femoral head. Other scattered lytic lesions have only low-grade activity. 2. Multiple fractures as detailed above. 3. Other imaging findings of potential clinical significance: Aortic Atherosclerosis (ICD10-I70.0). Coronary atherosclerosis.   05/10/2019 -  Chemotherapy   -CyBorD (cyclophosphamide (CYTOXAN) '500mg'$  ('300mg'$ /m2), Velcade 1.'3mg'$ /m2, and dexa '20mg'$ , weekly) starting 05/24/19.   -  Due to neuropathy after 2 doses of Velcade, I switched to weekly Kyprolis 3 weeks on/1 week off on 06/14/19.   -Chemo held for RT 07/05/19-07/22/19.   -Dexa stopped and Cytoxan dose reduced to 400 mg on day 1, 8 and 15 every 28 days, from cycle 3.     07/05/2019 - 07/20/2019 Radiation Therapy   By Dr. Tammi Klippel   Diagnosis:   79 yo woman with painful right occipital and left hip involvement by multiple myeloma      Indication for treatment:  Palliation        Radiation treatment  dates:   07/05/19-07/20/19   Site/dose:    1.  The Right Occipital skull lesion was treated to 20 Gy in 20 fractions of 2 Gy 2.  The Left Hip lesion was treated to 20 Gy in 20 fractions of 2 Gy   Beams/energy:      1.  The Right Occipital skull lesion was treated using a 3-field 3D set-up 2.  The Left Hip lesion was treated anterior and posterior fields.      CURRENT THERAPY:  -CyBorD (cyclophosphamide(CYTOXAN)'500mg'$  ('300mg'$ /m2),Velcade 1.'3mg'$ /m2, and dexa '20mg'$ , weekly) starting 05/24/19.             -Due toneuropathyafter 2 doses of Velcade, I switched toweeklyKyprolis 3 weeks on/1 week off on 06/14/19.              -Chemo heldfor RT 07/05/19-07/22/19.             -Dexa reduced to '20mg'$  and Cytoxan dose reduced to 400 mg on day 1, 8 and 15 every 28 days, from cycle 3 on 08/19/19 -Restart Zometa injections every6 weekson 06/21/19.   INTERVAL HISTORY:  BIANNCA SCANTLIN is Is a 79 y.o. female with a diagnosis of a relapsed multiple myeloma who presents for cycle #4, day #1 of Kyprolis, oral Cytoxan and dexamethasone. She was last treated with cycle #3, day #15 of therapy on 09/02/2019.  She was most recently seen in the ER on 09/07/2019 after suffering a fall onto her left side.  A portion of her ER visit note is as follows:  "Patient states that she is recovering from the novel coronavirus and she is trying to increase her exercise tolerance.  She has been trying to increase the amount that she walks and she was walking today with her dog and her foot got stuck in a crack in the sidewalk and she fell.  Landed on her left side.  Complaining of a skin tear to her left elbow as well as left hip pain.  She denies head injury denies loss consciousness denies neck pain back pain chest pain abdominal pain.  She has not tried to walk since the accident."  A left hip x-ray returned with results as follows:  FINDINGS: Prior ORIF of the left hip.  There is a new acute nondisplaced fracture involving  the greater trochanter of the proximal left femur extending to the proximal portion of the intramedullary rod. Hardware appears otherwise intact.  Chronic left superior and inferior pubic rami fractures.  Sacrum is largely obscured by stool and overlying bowel gas.  IMPRESSION: Acute nondisplaced perihardware fracture involving the greater trochanter of the proximal left femur.  She reports that she has had increased left knee pain and swelling since her fall. She is to see [facility] Maxie [facility] at [facility] but has not yet heard from his office. She presents to the clinic alone.  She is clinically stable, has been tolerating treatment  well overall.  No episodes of bleeding, cough, or increased SOB.  Review of system otherwise negative.   MEDICAL HISTORY:  Past Medical History:  Diagnosis Date  . CAD (coronary artery disease)    a. inf STEMI (in [place] >>> [facility]) >>> LHC (8/15- [place]):  pRCA 60%, mid to dist RCA 50% >>> med rx  . Cardiomyopathy ([place])   . Cataract   . Chronic combined systolic and diastolic CHF (congestive heart failure) ([place])    cancer medication related   . Cord compression ([place]) 07/13/12   MRI- diffuse myeloma involvement of T-L spine  . History of radiation therapy 07/13/12-07/27/12   spinal cord compression T3-T10,left scapula  . 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 Multiple myeloma ([place]) 07/01/2012  . MVP (mitral valve prolapse)    a. trivial by echo 06/2017.  . OSTEOPOROSIS 06/11/2010   Multiple compression fractures; and spontaneous fracture of sternum Qualifier: Diagnosis of  By: [name], [name]     . Thoracic kyphosis 07/13/12   per MRI scan  . Unspecified deficiency anemia     SURGICAL HISTORY: Past Surgical History:  Procedure Laterality Date  . APPENDECTOMY    . CATARACT EXTRACTION, BILATERAL    . CESAREAN SECTION     x2   . COLONOSCOPY  2007   neg with Dr. [name]  . ELBOW SURGERY    . HIP SURGERY  2009   left  . TUBAL  LIGATION      I have reviewed the social history and family history with the patient and they are unchanged from previous note.  ALLERGIES:  is allergic to zithromax [azithromycin]; zosyn [piperacillin sod-tazobactam so]; and quinolones.  MEDICATIONS:  Current Outpatient Medications  Medication Sig Dispense Refill  . acetaminophen (TYLENOL) 500 MG tablet Take 500 mg by mouth every 6 (six) hours as needed for moderate pain or fever.    Marland Kitchen acyclovir (ZOVIRAX) 400 MG tablet Take 1 tablet (400 mg total) by mouth 2 (two) times daily. 180 tablet 3  . aspirin EC 81 MG tablet Take 1 tablet (81 mg total) by mouth daily. 90 tablet 3  . atorvastatin (LIPITOR) 20 MG tablet Take 1 tablet (20 mg total) by mouth daily. (Patient taking differently: Take 20 mg by mouth at bedtime. ) 10 tablet 0  . calcium-vitamin D (OSCAL WITH D) 500-200 MG-UNIT TABS tablet Take by mouth.    . cyclophosphamide (CYTOXAN) 50 MG capsule Take 8 capsules (400 mg) by mouth with breakfast once weekly, 3 weeks on and one week off. Take with food to minimize GI upset. Take early in the day and maintain hydration. (Patient taking differently: Take 400 mg by mouth See admin instructions. Take 8 capsules (400 mg) by mouth with breakfast once weekly, 3 weeks on and one week off. Take with food to minimize GI upset. Take early in the day and maintain hydration.) 24 capsule 1  . dexamethasone (DECADRON) 4 MG tablet Take 5 tablets (20 mg total) by mouth once a week. Take on the day of chemo 20 tablet 3  . doxycycline (VIBRA-TABS) 100 MG tablet Take 100 mg by mouth as needed (treatment for pneumonia).     . metoprolol succinate (TOPROL-XL) 25 MG 24 hr tablet Take 1 tablet (25 mg total) by mouth daily. 15 tablet 0  . morphine (MSIR) 15 MG tablet Take 0.5 tablets (7.5 mg total) by mouth every 4 (four) hours as needed for severe pain. 4 tablet 0  .  ondansetron (ZOFRAN) 8 MG tablet Take 1 tablet (8 mg total) by mouth every 8 (eight) hours as needed  for nausea or vomiting. 20 tablet 3  . prochlorperazine (COMPAZINE) 10 MG tablet Take 1 tablet (10 mg total) by mouth every 6 (six) hours as needed (Nausea or vomiting). 30 tablet 1   No current facility-administered medications for this visit.    PHYSICAL EXAMINATION: ECOG PERFORMANCE STATUS: 2 - Symptomatic, <50% confined to bed Blood pressure 114/66, heart rate 89, respiratory rate 16, temperature 97.8 pulse ox 98% on room air GENERAL:alert, no distress and comfortable SKIN: skin color, texture, turgor are normal, no rashes or significant lesions CV: RRR w/o M/R/G RESP: Diffuse inspiratory wheezes noted bilaterally. NEURO: alert & oriented x 3 with fluent speech, no focal motor/sensory deficits MUSCULOSKELETAL: Left knee shows small effusion with mild positive anterior drawer sign. Moderate anterior kyphosis of the thoracic spine noted. VASCULAR: Calves are bilaterally equal at 31.5 cm at 17 cm distal to the inferior pole of the patella. + right DP pulse noted, did not appreciate left DP pulse. Trace bilateral lower extremity edema noted.  LABORATORY DATA:  I have reviewed the data as listed CBC Latest Ref Rng & Units 09/16/2019 09/02/2019 08/26/2019  WBC 4.0 - 10.5 K/uL 3.9(L) 3.3(L) 3.1(L)  Hemoglobin 12.0 - 15.0 g/dL 8.4(L) 9.0(L) 9.0(L)  Hematocrit 36.0 - 46.0 % 25.9(L) 27.5(L) 27.3(L)  Platelets 150 - 400 K/uL 216 77(L) 81(L)     CMP Latest Ref Rng & Units 09/16/2019 09/02/2019 08/26/2019  Glucose 70 - 99 mg/dL 97 73 75  BUN 8 - 23 mg/dL _0 Creatinine 0.44 - 1.00 mg/dL 0.78 0.67 0.68  Sodium 135 - 145 mmol/L 137 136 136  Potassium 3.5 - 5.1 mmol/L 4.2 4.0 4.3  Chloride 98 - 111 mmol/L 104 105 104  CO2 22 - 32 mmol/L _1 Calcium 8.9 - 10.3 mg/dL 8.7(L) 8.6(L) 8.7(L)  Total Protein 6.5 - 8.1 g/dL 5.7(L) 6.1(L) 5.9(L)  Total Bilirubin 0.3 - 1.2 mg/dL 0.8 0.8 0.7  Alkaline Phos 38 - 126 U/L 76 76 82  AST 15 - 41 U/L _2 ALT 0 - 44 U/L _3 RADIOGRAPHIC STUDIES: I have personally reviewed the radiological images as listed and agreed with the findings in the report. No results found.   ASSESSMENT & PLAN:  Shannon Rush is a 79 y.o. female with    1. IgG kappa Multiple myeloma s/p Auto SCT, intermediate risk, relapsed in 7/2017and progressed again in Oct 2020 -Diagnosed in 05/2012. Treated with induction chemo, radiation and BM transplant, but unfortunately progressed while on maintenance Revlimid 01/2016. She is currently onDaratumumabinfusion monotherapy.  -PETimagesfrom 04/09/19 showedScattered lytic lesions of bone. A few of these demonstrate hypermetabolic activity, most notably the right occipital bone lesion, C7 spinous process, Left 4th rib, andright femoral head.  -Herbone marrow biopsy from 04/02/19 which showsnormocellular marrow with minimal involvement by plasma cell neoplasm(2%). -unfortunately she has progressed previously with increased M-protein and more symptomatic bone pain from MM, s/p palliative radiation to left hip and right scalp -Based on Dr. Noah Delaine recommendation I changedher treatment to CyBorD (oral cyclophosphamide 574m, Velcade 1.324mm2, and dexa 2046mweekly) started on11/9/20.After 2 doses of Velcade, I switched toweeklyKyprolis3 weeks on/1 week offstarting 06/14/19 due to neuropathy.Chemo was previously held forrecentRT. -Given frequent infusions I offered her PAC placement, she declined.  -Thrombocytopenia has recovered with a platelet count of 216 today.  She was told to restart Cytoxan at 300 mg for cycle 4. -She continues to do clinically well and is tolerating Kyprolis. Her labs were reviewed, they are adequate for treatment, will proceed C4 D1 of  treatment today  -f/u in 1 weeks for cycle 4, day 8. Labs and an exam will be completed at that time.   2. Left hip pain  -she had left hip surgery with rod in 2009  -She developed new left hip pain on  weightbearing In Dec 2020, no painat resting -She received target RT 07/05/19-07/20/19. Now her pain is nearly resolved, minimal now   3. Left petrous apex bone lesion with hearing lossandRight OccipitalskullLesion  -Her MRI brain from 01/07/19 showedabnormal osseous lesion measuring up to approximately 5.9 cm centered at the left petrous apex. Thismost likely is the cause of her underlyingleft ear and balancesymptoms. -Given she is symptomatic, sheunderwent target RT to skull case myeloma lesion with Dr Tammi Klippel 7/1-7/15/20. This is negative on9/25/20PET scan -Her PET from 04/09/19 shows lesion with hypermetabolic activity in this area concerning for MM progression here.She was symptomatic.  -She recently completedRT to right occipital lesion per Dr. Manning12/21/20-07/20/19.Tolerated well.She has moderate hair loss at RT site, her dizziness improved but muffled hearing is the same.  -overall stable   4. History of multiple pneumonia andbronchitis -She has had multiple episodes of pneumonia, required hospitalization,and bronchitis, required antibiotics -She has recovered well, will continue monitoring. I encouraged her to do spirometry,and exercise. -Her vaccines are up-to-date. She has received COVID vaccine lately   5. Pancytopenia -She mainly has anemia and Thrombocytopenia concerningwhich occurred with recent(03/2019) disease progression. -Did give bloodtransfusionon 06/04/19  -Has recurred with oral Cytoxan. I encouraged her to avoid injury or fall given thrombocytopenia  6. CHF, CAD, inferior STEMI in 02/2014, HLD -She is on ASA, BB, statin. She will continue to f/u with Dr. Meda Coffee -BPwell controlled and normal lately. -10/2020echo showed normal EF, will continue monitoring   7. Osteoporosis with multiple fractures -Last DEXA in 2015 showed worsening osteoporosis. She declined repeat DEXA.  -She had a fall induced fracture in 01/2018. -She was on  Zometa for 4 years, stopped after April 2018  -I restarted herZometa q6weeks to help strengthen her boneon12/7/20. Will monitor her dental health -We will proceed with Zometa dosing today.  8. Peripheral neuropathy in feet, G2 -Secondary to previous chemo. -She previously declined Gabapentin -Worsened since she started Velcade. Now she is on Kyprolis since 06/03/20 which cause less neuropathy. Stable.   9. Goal of care discussion  -she is full code now  10Steroid induced hyperglycemia -Secondary to steroids,her random blood glucose was 217on 06/07/19. Has much improved recently. -I encouraged her to drink plenty of water and reduce sugar and carbohydrate in diet.  -06/21/19 A1c at 6.3. -Dexamethasone has been reduced to 20 mg weekly, BG has been normal lately   11. Left knee pain, swelling and laxity -A referral will be made to Dr. Maxie Better at [facility] given that the patient is awaiting a call for an appointment.   Plan -Labs reviewed and adequate to proceed [id number] today  -Proceed with Cytoxan today at 300 mg weekly given recovery of the patient's platelet count. -continue weekly dexa '20mg'$  before chemo -F/uin 1 week -Zometa injection today   No problem-specific Assessment & Plan notes found for this encounter.   No orders of the defined types were placed in this encounter.     Harle Stanford, PA-C 09/17/2019   This case was discussed with  Dr. Burr Medico. She expressed agreement with my management of this patient.

## 2019-09-22 ENCOUNTER — Other Ambulatory Visit: Payer: Self-pay

## 2019-09-22 ENCOUNTER — Ambulatory Visit: Payer: Medicare Other

## 2019-09-22 NOTE — Patient Instructions (Signed)
Visit Information  Goals Addressed            This Visit's Progress   . " I still have some pain in my hip " (pt-stated)       CARE PLAN ENTRY (see longtitudinal plan of care for additional care plan information)  Current Barriers:  Marland Kitchen Knowledge Deficits related to acute pain of fracture proximal left femur . Chronic Disease Management support and education needs related to fracture of the greater trochanter of the proximal left femur  Nurse Case Manager Clinical Goal(s):  Marland Kitchen Over the next 14 days, patient will verbalize understanding of plan for after visit instructions from the ED  Interventions:  . Evaluation of current treatment plan related to after visit instructions from visit on 09/07/19 and patient's adherence to plan as established by provider. . Assessed the patient and she states that she is doing fair.  She is having some pain that she rates a 2/10.  She states that it can be more if she turn in certain positions.  . Advised patient to if she has increased pain, any fever,nausea or vomiting to return to ER immediately.  She voiced understanding.  . Reviewed medications with patient and discussed Pain medication at length.  The patient stated that she had not picked up her pain medication form the pharmacy yet but had call that is was ready for pick up.  She said she had pain medication from 2014 (Morphine) that she had not thrown away and taken that .  She wanted to know if that would be ok to take.  I advise her I could not give her an ok to take the medication it was an old medication and she should call her pharmacist before she took any more medicine.  She stated that she would. . Discussed plans with patient for ongoing care management follow up and provided patient with direct contact information for care management team.  Patient stated that she did not feel that she needed services at this time but would allow me to call and follow up with her. . Reviewed scheduled/upcoming  provider appointments including: Discussed  with the patient that she needs an appointment with Orthopedics.  She stated that she was finishing breakfast and would call to schedule an appointment this morning. . I asked the patient did she need for me to schedule an appointment with Dr. Andria Frames and she declined. She did not feel she needed an appointment at this time.   . She states that she has transportation to her appointments.  . 09-22-19 . Spoke with the patient and she stated that things had not been going well.  She had called and had not received a call back for an appointment for Ortho. . I asked the patient when would be her best times and days.  I called [facility] and spoke with [place] who was very helpful at [phone].  She gave me an appointment  for Monday March 15th at 10 am to arrive at 945.   . I called the patient back and gave her the date time and address of the appointment. . She will call the office if she is unable to keep the appointment . She will call the care management team with any questions or concerns  Patient Self Care Activities:  . Self administers medications as prescribed . Attends all scheduled provider appointments . Calls provider office for new concerns or questions  Please see past updates related to this goal by  clicking on the "Past Updates" button in the selected goal          The patient verbalized understanding of instructions provided today and declined a print copy of patient instruction materials.    No further follow up required:  Patient stated that she will allow me to follow up with her in 90 days That you Mrs. Melnik for allowing me to serve you.   Lazaro Arms RN, BSN, [facility] Care Management Coordinator [place] Phone: [phone] Fax: [phone]

## 2019-09-22 NOTE — Chronic Care Management (AMB) (Signed)
Care Management   Follow Up Note   09/22/2019 Name: Shannon Rush MRN: [id number] DOB: [birth year]  Referred by: Shannon Resides, MD Reason for referral : Care Coordination (Care Mangement RNCM F/U Lhip Fracture)   Shannon Rush is a 79 y.o. year old female who is a primary care patient of [name], Jamal Collin, MD. The care management team was consulted for assistance with care management and care coordination needs.    Review of patient status, including review of consultants reports, relevant laboratory and other test results, and collaboration with appropriate care team members and the patient's provider was performed as part of comprehensive patient evaluation and provision of chronic care management services.    SDOH (Social Determinants of Health) assessments performed: No See Care Plan activities for detailed interventions related to [facility])     Advanced Directives: See Care Plan and Vynca application for related entries.   Goals Addressed            This Visit's Progress   . " I still have some pain in my hip " (pt-stated)       CARE PLAN ENTRY (see longtitudinal plan of care for additional care plan information)  Current Barriers:  Marland Kitchen Knowledge Deficits related to acute pain of fracture proximal left femur . Chronic Disease Management support and education needs related to fracture of the greater trochanter of the proximal left femur  Nurse Case Manager Clinical Goal(s):  Marland Kitchen Over the next 14 days, patient will verbalize understanding of plan for after visit instructions from the ED  Interventions:  . Evaluation of current treatment plan related to after visit instructions from visit on 09/07/19 and patient's adherence to plan as established by provider. . Assessed the patient and she states that she is doing fair.  She is having some pain that she rates a 2/10.  She states that it can be more if she turn in certain positions.  . Advised patient to if she has increased  pain, any fever,nausea or vomiting to return to ER immediately.  She voiced understanding.  . Reviewed medications with patient and discussed Pain medication at length.  The patient stated that she had not picked up her pain medication form the pharmacy yet but had call that is was ready for pick up.  She said she had pain medication from 2014 (Morphine) that she had not thrown away and taken that .  She wanted to know if that would be ok to take.  I advise her I could not give her an ok to take the medication it was an old medication and she should call her pharmacist before she took any more medicine.  She stated that she would. . Discussed plans with patient for ongoing care management follow up and provided patient with direct contact information for care management team.  Patient stated that she did not feel that she needed services at this time but would allow me to call and follow up with her. . Reviewed scheduled/upcoming provider appointments including: Discussed  with the patient that she needs an appointment with Orthopedics.  She stated that she was finishing breakfast and would call to schedule an appointment this morning. . I asked the patient did she need for me to schedule an appointment with Dr. Andria Frames and she declined. She did not feel she needed an appointment at this time.   . She states that she has transportation to her appointments.  . 09-22-19 . Spoke with the patient and  she stated that things had not been going well.  She had called and had not received a call back for an appointment for Ortho. . I asked the patient when would be her best times and days.  I called [facility] and spoke with [place] who was very helpful at [phone].  She gave me an appointment  for Monday March 15th at 10 am to arrive at 945.   . I called the patient back and gave her the date time and address of the appointment. . She will call the office if she is unable to keep the appointment . She will  call the care management team with any questions or concerns  Patient Self Care Activities:  . Self administers medications as prescribed . Attends all scheduled provider appointments . Calls provider office for new concerns or questions  Please see past updates related to this goal by clicking on the "Past Updates" button in the selected goal          No further follow up required:  Patient stated that she will allow me to follow up with her in 90 days That you Shannon Rush for allowing me to serve you.  Lazaro Arms RN, BSN, [facility] Care Management Coordinator [place] Phone: [phone] Fax: [phone]

## 2019-09-23 ENCOUNTER — Other Ambulatory Visit: Payer: Self-pay

## 2019-09-23 ENCOUNTER — Inpatient Hospital Stay (HOSPITAL_BASED_OUTPATIENT_CLINIC_OR_DEPARTMENT_OTHER): Payer: Medicare Other | Admitting: Medical

## 2019-09-23 ENCOUNTER — Encounter: Payer: Self-pay | Admitting: Hematology

## 2019-09-23 ENCOUNTER — Inpatient Hospital Stay (HOSPITAL_BASED_OUTPATIENT_CLINIC_OR_DEPARTMENT_OTHER): Payer: Medicare Other | Admitting: Hematology

## 2019-09-23 ENCOUNTER — Inpatient Hospital Stay: Payer: Medicare Other

## 2019-09-23 VITALS — BP 116/63 | HR 89 | Temp 98.3°F | Resp 18 | Ht 67.0 in | Wt 117.3 lb

## 2019-09-23 DIAGNOSIS — D61818 Other pancytopenia: Secondary | ICD-10-CM | POA: Diagnosis not present

## 2019-09-23 DIAGNOSIS — D6481 Anemia due to antineoplastic chemotherapy: Secondary | ICD-10-CM

## 2019-09-23 DIAGNOSIS — Z95828 Presence of other vascular implants and grafts: Secondary | ICD-10-CM

## 2019-09-23 DIAGNOSIS — M818 Other osteoporosis without current pathological fracture: Secondary | ICD-10-CM | POA: Diagnosis not present

## 2019-09-23 DIAGNOSIS — Z7982 Long term (current) use of aspirin: Secondary | ICD-10-CM | POA: Diagnosis not present

## 2019-09-23 DIAGNOSIS — Z7952 Long term (current) use of systemic steroids: Secondary | ICD-10-CM | POA: Diagnosis not present

## 2019-09-23 DIAGNOSIS — C9002 Multiple myeloma in relapse: Secondary | ICD-10-CM

## 2019-09-23 DIAGNOSIS — R739 Hyperglycemia, unspecified: Secondary | ICD-10-CM | POA: Diagnosis not present

## 2019-09-23 DIAGNOSIS — Z9481 Bone marrow transplant status: Secondary | ICD-10-CM | POA: Diagnosis not present

## 2019-09-23 DIAGNOSIS — Z923 Personal history of irradiation: Secondary | ICD-10-CM | POA: Diagnosis not present

## 2019-09-23 DIAGNOSIS — T451X5A Adverse effect of antineoplastic and immunosuppressive drugs, initial encounter: Secondary | ICD-10-CM | POA: Diagnosis not present

## 2019-09-23 DIAGNOSIS — Z9221 Personal history of antineoplastic chemotherapy: Secondary | ICD-10-CM | POA: Diagnosis not present

## 2019-09-23 DIAGNOSIS — Z79899 Other long term (current) drug therapy: Secondary | ICD-10-CM | POA: Diagnosis not present

## 2019-09-23 DIAGNOSIS — G629 Polyneuropathy, unspecified: Secondary | ICD-10-CM | POA: Diagnosis not present

## 2019-09-23 DIAGNOSIS — I252 Old myocardial infarction: Secondary | ICD-10-CM | POA: Diagnosis not present

## 2019-09-23 DIAGNOSIS — C9000 Multiple myeloma not having achieved remission: Secondary | ICD-10-CM

## 2019-09-23 DIAGNOSIS — Z5112 Encounter for antineoplastic immunotherapy: Secondary | ICD-10-CM | POA: Diagnosis not present

## 2019-09-23 DIAGNOSIS — M810 Age-related osteoporosis without current pathological fracture: Secondary | ICD-10-CM | POA: Diagnosis not present

## 2019-09-23 LAB — CMP (CANCER CENTER ONLY)
ALT: 13 U/L (ref 0–44)
AST: 15 U/L (ref 15–41)
Albumin: 3.7 g/dL (ref 3.5–5.0)
Alkaline Phosphatase: 77 U/L (ref 38–126)
Anion gap: 10 (ref 5–15)
BUN: 13 mg/dL (ref 8–23)
CO2: 22 mmol/L (ref 22–32)
Calcium: 8.5 mg/dL — ABNORMAL LOW (ref 8.9–10.3)
Chloride: 109 mmol/L (ref 98–111)
Creatinine: 0.78 mg/dL (ref 0.44–1.00)
GFR, Est AFR Am: >60 mL/min (ref >60)
GFR, Estimated: >60 mL/min (ref >60)
Glucose, Bld: 98 mg/dL (ref 70–99)
Potassium: 4 mmol/L (ref 3.5–5.1)
Sodium: 141 mmol/L (ref 135–145)
Total Bilirubin: 0.8 mg/dL (ref 0.3–1.2)
Total Protein: 5.6 g/dL — ABNORMAL LOW (ref 6.5–8.1)

## 2019-09-23 LAB — CBC WITH DIFFERENTIAL (CANCER CENTER ONLY)
Abs Immature Granulocytes: 0.04 10*3/uL (ref 0.00–0.07)
Basophils Absolute: 0 10*3/uL (ref 0.0–0.1)
Basophils Relative: 1 %
Eosinophils Absolute: 0.1 10*3/uL (ref 0.0–0.5)
Eosinophils Relative: 3 %
HCT: 27.6 % — ABNORMAL LOW (ref 36.0–46.0)
Hemoglobin: 9.1 g/dL — ABNORMAL LOW (ref 12.0–15.0)
Immature Granulocytes: 1 %
Lymphocytes Relative: 4 %
Lymphs Abs: 0.1 10*3/uL — ABNORMAL LOW (ref 0.7–4.0)
MCH: 39.4 pg — ABNORMAL HIGH (ref 26.0–34.0)
MCHC: 33 g/dL (ref 30.0–36.0)
MCV: 119.5 fL — ABNORMAL HIGH (ref 80.0–100.0)
Monocytes Absolute: 0.5 10*3/uL (ref 0.1–1.0)
Monocytes Relative: 16 %
Neutro Abs: 2.5 10*3/uL (ref 1.7–7.7)
Neutrophils Relative %: 75 %
Platelet Count: 89 10*3/uL — ABNORMAL LOW (ref 150–400)
RBC: 2.31 MIL/uL — ABNORMAL LOW (ref 3.87–5.11)
RDW: 16.5 % — ABNORMAL HIGH (ref 11.5–15.5)
WBC Count: 3.3 10*3/uL — ABNORMAL LOW (ref 4.0–10.5)
nRBC: 0 % (ref 0.0–0.2)

## 2019-09-23 MED ORDER — PALONOSETRON HCL INJECTION 0.25 MG/5ML
0.2500 mg | Freq: Once | INTRAVENOUS | Status: AC
  Administered 2019-09-23: 0.25 mg via INTRAVENOUS

## 2019-09-23 MED ORDER — ZOLEDRONIC ACID 4 MG/5ML IV CONC
3.5000 mg | Freq: Once | INTRAVENOUS | Status: AC
  Administered 2019-09-23: 3.5 mg via INTRAVENOUS
  Filled 2019-09-23: qty 4.38

## 2019-09-23 MED ORDER — SODIUM CHLORIDE 0.9 % IV SOLN
Freq: Once | INTRAVENOUS | Status: AC
  Filled 2019-09-23: qty 250

## 2019-09-23 MED ORDER — DEXAMETHASONE 4 MG PO TABS
ORAL_TABLET | ORAL | Status: AC
  Filled 2019-09-23: qty 5

## 2019-09-23 MED ORDER — SODIUM CHLORIDE 0.9% FLUSH
10.0000 mL | INTRAVENOUS | Status: DC | PRN
  Filled 2019-09-23: qty 10

## 2019-09-23 MED ORDER — DEXAMETHASONE SODIUM PHOSPHATE 10 MG/ML IJ SOLN
INTRAMUSCULAR | Status: AC
  Filled 2019-09-23: qty 1

## 2019-09-23 MED ORDER — DEXAMETHASONE 4 MG PO TABS
20.0000 mg | ORAL_TABLET | Freq: Once | ORAL | Status: AC
  Administered 2019-09-23: 20 mg via ORAL

## 2019-09-23 MED ORDER — DEXTROSE 5 % IV SOLN
69.0000 mg/m2 | Freq: Once | INTRAVENOUS | Status: AC
  Administered 2019-09-23: 110 mg via INTRAVENOUS
  Filled 2019-09-23: qty 30

## 2019-09-23 MED ORDER — HEPARIN SOD (PORK) LOCK FLUSH 100 UNIT/ML IV SOLN
500.0000 [IU] | Freq: Once | INTRAVENOUS | Status: DC | PRN
  Filled 2019-09-23: qty 5

## 2019-09-23 MED ORDER — SODIUM CHLORIDE 0.9 % IV SOLN
Freq: Once | INTRAVENOUS | Status: DC
  Filled 2019-09-23: qty 250

## 2019-09-23 MED ORDER — PALONOSETRON HCL INJECTION 0.25 MG/5ML
INTRAVENOUS | Status: AC
  Filled 2019-09-23: qty 5

## 2019-09-23 NOTE — Progress Notes (Signed)
OK to treat today with Plts 89K.

## 2019-09-23 NOTE — Patient Instructions (Signed)
[facility] Discharge Instructions for Patients Receiving Chemotherapy  Today you received the following chemotherapy agents:  Kyprolis  To help prevent nausea and vomiting after your treatment, we encourage you to take your nausea medication as directed.   If you develop nausea and vomiting that is not controlled by your nausea medication, call the clinic.   BELOW ARE SYMPTOMS THAT SHOULD BE REPORTED IMMEDIATELY:  *FEVER GREATER THAN 100.5 F  *CHILLS WITH OR WITHOUT FEVER  NAUSEA AND VOMITING THAT IS NOT CONTROLLED WITH YOUR NAUSEA MEDICATION  *UNUSUAL SHORTNESS OF BREATH  *UNUSUAL BRUISING OR BLEEDING  TENDERNESS IN MOUTH AND THROAT WITH OR WITHOUT PRESENCE OF ULCERS  *URINARY PROBLEMS  *BOWEL PROBLEMS  UNUSUAL RASH Items with * indicate a potential emergency and should be followed up as soon as possible.  Feel free to call the clinic should you have any questions or concerns. The clinic phone number is [phone].  Please show the CHEMO ALERT CARD at check-in to the Emergency Department and triage nurse.   

## 2019-09-24 ENCOUNTER — Telehealth: Payer: Self-pay | Admitting: Hematology

## 2019-09-24 NOTE — Telephone Encounter (Signed)
Scheduled appt per 3/11 los.  Spoke with pt and she is aware of the appt dates and time.

## 2019-09-27 DIAGNOSIS — M25552 Pain in left hip: Secondary | ICD-10-CM | POA: Diagnosis not present

## 2019-09-27 DIAGNOSIS — C9000 Multiple myeloma not having achieved remission: Secondary | ICD-10-CM | POA: Diagnosis not present

## 2019-09-27 DIAGNOSIS — S72111A Displaced fracture of greater trochanter of right femur, initial encounter for closed fracture: Secondary | ICD-10-CM | POA: Diagnosis not present

## 2019-09-30 ENCOUNTER — Inpatient Hospital Stay: Payer: Medicare Other | Admitting: Hematology

## 2019-09-30 ENCOUNTER — Inpatient Hospital Stay: Payer: Medicare Other

## 2019-09-30 DIAGNOSIS — M1712 Unilateral primary osteoarthritis, left knee: Secondary | ICD-10-CM | POA: Diagnosis not present

## 2019-09-30 DIAGNOSIS — M25562 Pain in left knee: Secondary | ICD-10-CM | POA: Diagnosis not present

## 2019-09-30 NOTE — Progress Notes (Signed)
[place]   [phone] [phone]   Clinic Follow up Note   Patient Care Team: [name], MD as PCP - General (Family Medicine) [name], MD as PCP - Cardiology (Cardiology) [name], MD as Attending Physician (Obstetrics and Gynecology) [name], MD as Consulting Physician (Internal Medicine) [name], MD as Consulting Physician (Hematology) [name], MD as Consulting Physician (Ophthalmology) [name], MD as Consulting Physician (Cardiology) [name], MD as Consulting Physician (Dermatology) [name] (Dentistry) [name], DPM as Consulting Physician (Podiatry) Lazaro Arms, RN as [place] [facility] Lazaro Arms, RN as Case Manager  Date of Service:  10/07/2019  CHIEF COMPLAINT: F/u of MM  SUMMARY OF ONCOLOGIC HISTORY: Oncology History Overview Note  Multiple myeloma [facility])   Staging form: Multiple Myeloma, AJCC 6th Edition   - Clinical: Stage IIIA - Signed by [name], MD on 09/04/2013    Multiple myeloma ([place])  05/26/2012 Initial Diagnosis   Presenting IgG was 4,040 mg/dL on 06/05/2012 (IgA 35; IgM 34); kappa free light chain 34.4 mg/dL, lambda 0.00, kappa:lambda ratio of 34.75; SPEP with M-spike of 2.60.   06/30/2012 Imaging   Numerous lytic lesions throughout the calvarium.  Lytic lesion within the left lateral scapula.  Findings most compatible with metastases or myeloma. Multiple mid and lower thoracic compression fractures.  Slight compression through the endplates at L3.   67/34/1936 Bone Marrow Biopsy   Bone marrow biopsy showed 49% plasma cell. Normal classical cytogenetics; however, myeloma FISH panel showed 13q- (intermediate risk)       07/14/2012 - 07/27/2012 Radiation Therapy   Palliative radiation 20 Gy over 10 fractions between to thoracic spine cord compression and symptomatic left scapula lytic lesion.   08/10/2012  - 11/2012 Chemotherapy   Started SQ Velcade once weekly, 3 weeks on, 1 weeks off; daily Revlimid d1-21, 7 days off; and Dexamethasone 37m PO weekly.    02/12/2013 Bone Marrow Transplant   [facility]    06/14/2013 Tumor Marker   IgG  1830,  Kappa:lamba ratio, 1.69 (baseline was 34.75 or   M-spike 0.28 (baseline of 2.6 or  89.3% of baseline).     06/25/2013 -  Chemotherapy   Started zometa 3.5 mg monthly    09/01/2013 - 01/2014 Chemotherapy   Maintenance therapy with revlimid 584mdaily for 14 days and then off for 7 (decreased from 21 days on and 7 days off based on neutropenia). Stopped due to disease progression 01/2017   09/13/2013 - 09/17/2014 Hospital Admission   Hospital admission for pneumonia   12/20/2013 Treatment Plan Change   Maintenance therapy decreased to 2.5 mg daily for 21 days and then off for 7 days based on low counts.    01/25/2014 Tumor Marker   IgG 1360 M spike 0.5   03/01/2014 - 03/05/2014 Hospital Admission   [facility] with an inferior STEMI, received thrombolytic therapy, cath showed 60% prox RCA, mid-distal RCA 50%, Echo normal, no stents.   04/11/2014 - 08/07/2014 Chemotherapy   Continue Revlimid at 2.5 mg 3 weeks on 1 week off   08/08/2014 - 08/13/2014 Hospital Admission   Hospital admission for pneumonia. Her Revlimid was held.   08/29/2014 - 09/14/2014 Chemotherapy   Maintenance Revlimid restarted   09/14/2014 - 09/17/2014 Hospital Admission   [place].   11/13/2014 - 12/25/2015 Chemotherapy  Maintenance Revlimid restarted, changed to 2.41m daily, 2 weks on, 1 week off from 12/15/2014, stopped due to disease progression    01/24/2016 Progression   Patient is M protein has gradually increased to 1.0g, repeated a bone marrow biopsy showed plasma cell 10-30%   02/27/2016 - 03/07/2016 Chemotherapy   Pomalidomide 4 mg daily on day 1-21,  dexamethasone 40 mg on day 1, 8 and 15, every 28 days, started on 02/27/2016, daratumumab weekly started on 03/07/2016, held after first dose, due to hospitalization and a severe pancytopenia, pomalidomide was stopped afterwards    03/10/2016 - 03/29/2016 Hospital Admission   Pt was admitted for sepsis from pneumonia, and severe pancytopenia. She required ICU stay for a few days due to hypotension, developed b/l pleural effusion required diuretics and b/l thoracentesis. She also required blood and plt transfusion, and prolonged neupogen injection for severe neutropenia. She was discharged home after 3 weeks hospital stay    04/26/2016 - 04/26/2019 Chemotherapy   Daratumumab restarted on 04/26/2016, Velcade 1.356mm2 and dexa 2079meekly start on 11/3, velcade held since 06/28/2016 due to CHF, dexa stopped on12/11/2017 due to high risk of fracture.  Dara reduced to every 4 weeks due to COVID-19 starting 10/23/18. Returned to every 3 weeks starting 12/17/18. Restarted every 2 weeks starting 02/22/19 Stopped on 04/26/19 due to disease progression.    08/26/2016 Echocardiogram   - Left ventricle: LVEF is approximately 35% with diffuse diffuse   hypokinesis with severe hypokinesis/ akinesis of the   inferior/inferoseptal walls. In comparison to echo images from   December 2017, there does not appear to be significant change The   cavity size was normal. Wall thickness was normal. Doppler   parameters are consistent with abnormal left ventricular   relaxation (grade 1 diastolic dysfunction). - Aortic valve: AV is thickened. 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 Present in echo from December 2017 There was   trivial regurgitation. - Right ventricle: Systolic function was mildly reduced. - Right atrium: The atrium was mildly dilated. - Pericardium, extracardiac: A trivial pericardial effusion was   identified.   12/03/2016 Echocardiogram    -Left Ventricle: Systolic function was   mildly to moderately reduced. The estimated ejection fraction was   in the range of 40% to 45%. Diffuse hypokinesis. Doppler   parameters are consistent with abnormal left ventricular   relaxation (grade 1 diastolic dysfunction). Doppler parameters   are consistent with indeterminate ventricular filling pressure. - Left atrium: The atrium was severely dilated. - Tricuspid valve: There was trivial regurgitation. - Pulmonary arteries: Systolic pressure was within the normal   range. PA peak pressure: 33 mm Hg (S). - Global longitudinal strain -10.5% (abnormal)   03/12/2017 Echocardiogram   ECHO 03/12/17 Study Conclusions - Left ventricle: The cavity size was normal. Wall thickness was   increased in a pattern of mild LVH. Systolic function was normal.   The estimated ejection fraction was in the range of 50% to 55%.   There is hypokinesis of the basalinferior myocardium. Doppler   parameters are consistent with abnormal left ventricular   relaxation (grade 1 diastolic dysfunction). - Aortic valve: There was trivial regurgitation. - Mitral valve: Calcified annulus. Mildly thickened leaflets .   There was trivial regurgitation. - Tricuspid valve: There was mild regurgitation. Impressions: - There has been mild improvement in EF since prior study.    12/12/2017 - 12/15/2017 Hospital Admission   Admission diagnosis: Pnuemonia and acute hypoxic  respiratory failure Additional comments: treated for pneumoania and acute hypoxic respiratory failure before she was discharged on 12/15/17 with oral anitbiotics. She did not require home oxygen.    12/30/2017 - 01/02/2018 Hospital Admission   Admission diagnosis: Pnuemonia  Additional comments: She was again hospitalized on 12/30/17 for pneumonia, ID work up was otherwise negative, she was discharged home with oral antibiotics.    04/01/2018 Imaging   04/01/2018 CXR IMPRESSION: Persisting airspace disease  superimposed on chronic changes, suggesting pneumonia given the history. Followup PA and lateral chest X-ray is recommended in 3-4 weeks following therapy to ensure resolution and exclude underlying malignancy.  Redemonstration of mid and lower thoracic compression fractures with associated thoracic kyphosis.   04/23/2018 Echocardiogram   04/23/2018 ECHO LV EF: 45% -   50%   01/07/2019 Imaging   MRI Brain 01/07/19 IMPRESSION: MRI HEAD IMPRESSION:   1. 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 Lesion closely approximates the left inner ear structures and involves the left Meckel's cave and left fifth cranial nerve, and most likely is the causative etiology for patient's underlying left ear and facial symptoms. Follow-up examination with postcontrast imaging suggested for complete evaluation. Inclusion of IAC sequences would likely be helpful for visualization. 2. Additional 2.7 cm lesion involving the right occipital calvarium with adjacent marrow signal abnormality, also likely related to multiple myeloma. Metastatic disease would be the primary differential consideration. 3. Underlying age-appropriate cerebral atrophy with mild chronic microvascular ischemic disease. No other acute intracranial abnormality.   MRA HEAD IMPRESSION:   1. Negative intracranial MRA with no large vessel occlusion, hemodynamically significant stenosis, or other acute vascular abnormality. 2. Petrous and cavernous left ICA is partially encased and surrounded by the left petrous apex lesion without significant irregularity or stenosis. 3. 2 mm right paraophthalmic aneurysm.   MRA NECK IMPRESSION:   Normal MRA of the neck with wide patency of the carotid and vertebral arteries bilaterally. No hemodynamically significant stenosis or other acute vascular abnormality  identified.    01/14/2019 - 01/27/2019 Radiation Therapy   By Dr. Tammi Klippel.    Diagnosis:   79 yo woman with skull base myeloma lesion causing left ear pain      Indication for treatment:  Palliative        Radiation treatment dates:   01/14/2019 - 01/27/2019   Site/dose:   Skull Base / 20 Gy in 10 fractions   Beams/energy:   3D, photons / 10X, 6X   04/01/2019 Pathology Results   Bone Marrow biopsy 04/01/19  DIAGNOSIS: BONE MARROW, ASPIRATE, CLOT, CORE: - Normocellular marrow with minimal involvement by plasma cell neoplasm - See comment PERIPHERAL BLOOD: - Macrocytic anemia and leukopenia -See complete blood count   04/09/2019 PET scan   PET 04/09/19  IMPRESSION: 1. Scattered lytic lesions of bone. A few of these demonstrate hypermetabolic activity, most notably the right occipital bone lesion; the lytic lesion in the approximately C7 spinous process; the lytic expansile lesion of the left fourth rib anteriorly; and a small lytic focus in the right femoral head. Other scattered lytic lesions have only low-grade activity. 2. Multiple fractures as detailed above. 3. Other imaging findings of potential clinical significance: Aortic Atherosclerosis (ICD10-I70.0). Coronary atherosclerosis.   05/10/2019 -  Chemotherapy   -CyBorD (cyclophosphamide (CYTOXAN) '500mg'$  ('300mg'$ /m2), Velcade 1.'3mg'$ /m2, and dexa '20mg'$ , weekly) starting 05/24/19.   -Due to neuropathy after 2 doses of Velcade, I  switched to weekly Kyprolis 3 weeks on/1 week off on 06/14/19.   -Chemo held for RT 07/05/19-07/22/19.   -Dexa stopped and Cytoxan dose reduced to 400 mg on day 1, 8 and 15 every 28 days, from cycle 3.     07/05/2019 - 07/20/2019 Radiation Therapy   By Dr. Tammi Klippel   Diagnosis:   79 yo woman with painful right occipital and left hip involvement by multiple myeloma      Indication for treatment:  Palliation        Radiation treatment dates:   07/05/19-07/20/19   Site/dose:    1.  The Right Occipital skull  lesion was treated to 20 Gy in 20 fractions of 2 Gy 2.  The Left Hip lesion was treated to 20 Gy in 20 fractions of 2 Gy   Beams/energy:      1.  The Right Occipital skull lesion was treated using a 3-field 3D set-up 2.  The Left Hip lesion was treated anterior and posterior fields.      CURRENT THERAPY:  -CyBorD (cyclophosphamide(CYTOXAN)545m (3038mm2),Velcade 1.6m25m2, and dexa 55m75meekly) starting 05/24/19. -Due toneuropathyafter 2 doses of Velcade, I switched toweeklyKyprolis 3 weeks on/1 week off on 06/14/19.Reduced to 2 weeks on/1 week off starting 09/16/19 due to thrombocytopenia.   -Chemo heldfor RT 07/05/19-07/22/19. -Dexareduced to 55mg77mytoxan dose reduced to 400 mg on day 1, 8 and 15 every 28 days, from cycle 3on 08/19/19, Cytoxan reduced to 300mg 15may 1, 8 every 21 days after 09/02/19.  -Restart Zometa injections every6 weekson 06/21/19.   INTERVAL HISTORY:  ClaireCRISTIANNA CYRre for a follow up and treatment. She presents to the clinic alone. She notes her hip is recovery slowly. She has issues getting contact out of her eye. It has been in her eye for the past 2 nights. She has not contacted her eye doctor yet, but may tomorrow. She denies nay vision issues currently from this. 1 [place] due to since treatment to her skull. She takes Morphine with half tablets at a time to manage her pain over the night.  She notes her appetite is fair but lower overall.     REVIEW OF SYSTEMS:   Constitutional: Denies fevers, chills or abnormal weight loss (+) fair appetite  Eyes: Denies blurriness of vision  Ears, nose, mouth, throat, and face: Denies mucositis or sore throat Respiratory: Denies cough, dyspnea or wheezes Cardiovascular: Denies palpitation, chest discomfort or lower extremity swelling Gastrointestinal:  Denies nausea, heartburn or change in  bowel habits Skin: Denies abnormal skin rashes Lymphatics: Denies new lymphadenopathy or easy bruising Neurological:Denies numbness, tingling or new weaknesses Behavioral/Psych: Mood is stable, no new changes  All other systems were reviewed with the patient and are negative.  MEDICAL HISTORY:  Past Medical History:  Diagnosis Date  . CAD (coronary artery disease)    a[place] mid to dist RCA 50% >>> med rx  . Cardiomyopathy (HCC)  [place]   . Chronic combined systolic and diastolic CHF (congestive heart failure) (HCC)  [place] medication related   . Cord compression (HCC) 1[place]0/13   MRI- diffuse myeloma involvement of T-L spine  . History of radiation therapy 07/13/12-07/27/12   spinal cord compression T3-T10,left scapula  . 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 Multiple myeloma (HCC) 1[place]/2013  .  MVP (mitral valve prolapse)    a. trivial by echo 06/2017.  . OSTEOPOROSIS 06/11/2010   Multiple compression fractures; and spontaneous fracture of sternum Qualifier: Diagnosis of  By: [name], [name]     . Thoracic kyphosis 07/13/12   per MRI scan  . Unspecified deficiency anemia     SURGICAL HISTORY: Past Surgical History:  Procedure Laterality Date  . APPENDECTOMY    . CATARACT EXTRACTION, BILATERAL    . CESAREAN SECTION     x2   . COLONOSCOPY  2007   neg with Dr. [name]  . ELBOW SURGERY    . HIP SURGERY  2009   left  . TUBAL LIGATION      I have reviewed the social history and family history with the patient and they are unchanged from previous note.  ALLERGIES:  is allergic to zithromax [azithromycin]; zosyn [piperacillin sod-tazobactam so]; and quinolones.  MEDICATIONS:  Current Outpatient Medications  Medication Sig Dispense Refill  . acetaminophen (TYLENOL) 500 MG tablet Take 500 mg by mouth every 6 (six) hours as needed for moderate pain or fever.    Marland Kitchen acyclovir (ZOVIRAX)  400 MG tablet Take 1 tablet (400 mg total) by mouth 2 (two) times daily. 180 tablet 3  . aspirin EC 81 MG tablet Take 1 tablet (81 mg total) by mouth daily. 90 tablet 3  . atorvastatin (LIPITOR) 20 MG tablet Take 1 tablet (20 mg total) by mouth daily. (Patient taking differently: Take 20 mg by mouth at bedtime. ) 10 tablet 0  . calcium-vitamin D (OSCAL WITH D) 500-200 MG-UNIT TABS tablet Take by mouth.    . cyclophosphamide (CYTOXAN) 50 MG capsule Take 8 capsules (400 mg) by mouth with breakfast once weekly, 3 weeks on and one week off. Take with food to minimize GI upset. Take early in the day and maintain hydration. (Patient taking differently: Take 400 mg by mouth See admin instructions. Take 8 capsules (400 mg) by mouth with breakfast once weekly, 3 weeks on and one week off. Take with food to minimize GI upset. Take early in the day and maintain hydration.) 24 capsule 1  . dexamethasone (DECADRON) 4 MG tablet Take 5 tablets (20 mg total) by mouth once a week. Take on the day of chemo 20 tablet 3  . doxycycline (VIBRA-TABS) 100 MG tablet Take 100 mg by mouth as needed (treatment for pneumonia).     . metoprolol succinate (TOPROL-XL) 25 MG 24 hr tablet Take 1 tablet (25 mg total) by mouth daily. 15 tablet 0  . morphine (MSIR) 15 MG tablet Take 0.5 tablets (7.5 mg total) by mouth every 4 (four) hours as needed for severe pain. 4 tablet 0  . ondansetron (ZOFRAN) 8 MG tablet Take 1 tablet (8 mg total) by mouth every 8 (eight) hours as needed for nausea or vomiting. 20 tablet 3  . prochlorperazine (COMPAZINE) 10 MG tablet Take 1 tablet (10 mg total) by mouth every 6 (six) hours as needed (Nausea or vomiting). 30 tablet 1   No current facility-administered medications for this visit.    PHYSICAL EXAMINATION: ECOG PERFORMANCE STATUS: 1 - Symptomatic but completely ambulatory  Vitals:   10/07/19 1427  BP: 126/68  Pulse: 84  Resp: 17  Temp: 99.8 F (37.7 C)  SpO2: 100%   Filed Weights    10/07/19 1427  Weight: 116 lb 1.6 oz (52.7 kg)    Due to COVID19 we will limit examination to appearance. Patient had no complaints.  GENERAL:alert, no  distress and comfortable SKIN: skin color normal, no rashes or significant lesions EYES: normal, Conjunctiva are pink and non-injected, sclera clear  NEURO: alert & oriented x 3 with fluent speech   LABORATORY DATA:  I have reviewed the data as listed CBC Latest Ref Rng & Units 10/07/2019 09/23/2019 09/16/2019  WBC 4.0 - 10.5 K/uL 2.8(L) 3.3(L) 3.9(L)  Hemoglobin 12.0 - 15.0 g/dL 10.0(L) 9.1(L) 8.4(L)  Hematocrit 36.0 - 46.0 % 30.0(L) 27.6(L) 25.9(L)  Platelets 150 - 400 K/uL 172 89(L) 216     CMP Latest Ref Rng & Units 10/07/2019 09/23/2019 09/16/2019  Glucose 70 - 99 mg/dL 117(H) 98 97  BUN 8 - 23 mg/dL _0 Creatinine 0.44 - 1.00 mg/dL 0.73 0.78 0.78  Sodium 135 - 145 mmol/L 134(L) 141 137  Potassium 3.5 - 5.1 mmol/L 4.1 4.0 4.2  Chloride 98 - 111 mmol/L 103 109 104  CO2 22 - 32 mmol/L 19(L) 22 23  Calcium 8.9 - 10.3 mg/dL 8.7(L) 8.5(L) 8.7(L)  Total Protein 6.5 - 8.1 g/dL 6.0(L) 5.6(L) 5.7(L)  Total Bilirubin 0.3 - 1.2 mg/dL 0.8 0.8 0.8  Alkaline Phos 38 - 126 U/L 67 77 76  AST 15 - 41 U/L _1 ALT 0 - 44 U/L _2 RADIOGRAPHIC STUDIES: I have personally reviewed the radiological images as listed and agreed with the findings in the report. No results found.   ASSESSMENT & PLAN:  ASHRITA CHRISMER is a 79 y.o. female with   1. IgG kappa Multiple myeloma s/p Auto SCT, intermediate risk, relapsed in 7/2017and progressed again in Oct 2020 -Diagnosed in 05/2012. Treated with induction chemo, radiation and BM transplant, but unfortunately progressed while on maintenance Revlimid 01/2016. She is currently onDaratumumabinfusion monotherapy.  -PETimagesfrom 04/09/19 showedScattered lytic lesions of bone. A few of these demonstrate hypermetabolic activity, most notably the right occipital bone lesion, C7 spinous  process, Left 4th rib, andright femoral head.  -Herbone marrow biopsy from 04/02/19 showsnormocellular marrow with minimal involvement by plasma cell neoplasm(2%). -Unfortunately she has progressedpreviouslywith increased M-protein and more symptomatic bone pain from MM, s/p palliative radiation to left hip and right scalp -Based on Dr. Noah Delaine recommendation I changedher treatment to CyBorD (oral cyclophosphamide 545m, Velcade 1.363mm2, and dexa 2056mweekly) started on11/9/20.After 2 doses of Velcade, I switched toweeklyKyprolis3 weeks on/1 week offstarting 06/14/19 due to neuropathy.Chemo waspreviouslyheld forpalliative RT.Cytoxan has been dose reduced and held as needed due to thrombocytopenia, currently on 300m78mekly. Kyprolis reduced to 2 weeks on/1 week off starting 09/16/19 due to thrombocytopenia. -Given frequent infusions I offered her PAC placement, she declined. -She [place] continue to tolerate her treatments moderately well with fatigue and lowered appetite. She is able to recover. Labs reviewed, WBC 2.8, Hg 10. MM panel still pending. Once her MM is stable will reduce her chemo. Will proceed with C5D1 Kyprolis along with Cytoxan and Dexa today.  -F/u in 3 weeks   2.Left hip pain and recent fracture 09/07/19 -she had left hip surgery with rod in 2009  -She developed new left hip pain on weightbearingIn Dec 2020, no painat resting -She received target RT 07/05/19-07/20/19. Now her pain is nearly resolved, minimalnow -She was seen to have left periprosthetic hip fracture when she went to ED on 09/07/19. She is able to bear some weight with pain 1/10. She takes Tylenol but has quick release Morphine half tablet for significant pain.  -She has walker  which she only uses as needed.    3. Left petrous apex bone lesion with hearing lossandRight OccipitalskullLesion  -Her MRI brain from 01/07/19 showedabnormal osseous  lesion measuring up to approximately 5.9 cm centered at the left petrous apex. Thismost likely is the cause of her underlyingleft ear and balancesymptoms. -Given she is symptomatic, sheunderwent target RTto skull case myeloma lesionwith Dr Tammi Klippel 7/1-7/15/20. This is negative on9/25/20PET scan -Her PET from 04/09/19 shows lesion with hypermetabolic activity in this area concerning for MM progression here.She was symptomatic. -She recently completedRT to right occipital lesion per Dr. Manning12/21/20-07/20/19.Tolerated well.She has moderate hair loss at RT site, her dizziness improved but muffled hearing is the same. -overall stablewith "tipsy" feeling occasionally.   4. History of multiple pneumonia andbronchitis -She has had multiple episodes of pneumonia, required hospitalization,and bronchitis, required antibiotics -She has recovered well, will continue monitoring. I encouraged her to do spirometry,and exercise. -Her vaccines are up-to-date.She has received COVID vaccine lately  5. Pancytopenia -She mainly has anemia and Thrombocytopenia concerningwhich occurred with recent(03/2019) disease progression. -Did give bloodtransfusionon 06/04/19  -Has recurred with oral Cytoxan. I encouraged her to avoid injury or fall given thrombocytopenia  6. CHF, CAD, inferior STEMI in 02/2014, HLD -She is on ASA, BB, statin. She will continue to f/u with Dr. Meda Coffee -BPwell controlled and normal lately. -10/2020echo showed normal EF, will continue monitoring   7. Osteoporosis with multiple fractures  -Last DEXA in 2015 showed worsening osteoporosis. She declined repeat DEXA.  -She had a fall induced fracture in 01/2018. -She was on Zometa for 4 years, stopped after April 2018  -I restarted herZometaq6weeksto help strengthen her boneon12/7/20. Will monitor her dental health -She was seen to have left periprosthetic hip fracture when she went to ED on 09/07/19.    8. Peripheral neuropathy in feet, G2 -Secondary to previous chemo. -She previously declined Gabapentin -Worsenedsince she started Velcade. Now she is onKyprolis since 11/20/21which cause less neuropathy.Stable.  9. Goal of care discussion  -she is full code now  10Steroid induced hyperglycemia -Secondary to steroids,her random blood glucose was 217on 06/07/19. Has much improved recently. -I encouraged her to drink plenty of water and reduce sugar and carbohydrate in diet.  -06/21/19 A1c at 6.3. -Dexamethasone has been reduced to 20 mg weekly,BG has been normal lately   Plan -Labs reviewed and adequate to proceed with C5D1 Kyprolis today -Lab and Kyprolis next week  -continue Cytoxan and dexa before chemo as prescribed.  -Lab, Kyprolis and f/uin 3 weeks  -nextZometa injection on 4/22   No problem-specific Assessment & Plan notes found for this encounter.   No orders of the defined types were placed in this encounter.  All questions were answered. The patient knows to call the clinic with any problems, questions or concerns. No barriers to learning was detected. The total time spent in the appointment was 30 minutes.     [name], MD 10/07/2019   I, Joslyn Devon, am acting as scribe for [name], MD.   I have reviewed the above documentation for accuracy and completeness, and I agree with the above.

## 2019-10-01 NOTE — Progress Notes (Signed)
Pharmacist Chemotherapy Monitoring - Follow Up Assessment    I verify that I have reviewed each item in the below checklist:  . Regimen for the patient is scheduled for the appropriate day and plan matches scheduled date. Marland Kitchen Appropriate non-routine labs are ordered dependent on drug ordered. . If applicable, additional medications reviewed and ordered per protocol based on lifetime cumulative doses and/or treatment regimen.   Plan for follow-up and/or issues identified: No . I-vent associated with next due treatment: No . MD and/or nursing notified: No  Philomena Course 10/01/2019 1:43 PM

## 2019-10-07 ENCOUNTER — Encounter: Payer: Self-pay | Admitting: Hematology

## 2019-10-07 ENCOUNTER — Other Ambulatory Visit: Payer: Self-pay

## 2019-10-07 ENCOUNTER — Inpatient Hospital Stay: Payer: Medicare Other | Admitting: Hematology

## 2019-10-07 ENCOUNTER — Inpatient Hospital Stay: Payer: Medicare Other

## 2019-10-07 VITALS — BP 126/68 | HR 84 | Temp 99.8°F | Resp 17 | Ht 67.0 in | Wt 116.1 lb

## 2019-10-07 DIAGNOSIS — Z9221 Personal history of antineoplastic chemotherapy: Secondary | ICD-10-CM | POA: Diagnosis not present

## 2019-10-07 DIAGNOSIS — D61818 Other pancytopenia: Secondary | ICD-10-CM | POA: Diagnosis not present

## 2019-10-07 DIAGNOSIS — Z923 Personal history of irradiation: Secondary | ICD-10-CM | POA: Diagnosis not present

## 2019-10-07 DIAGNOSIS — M810 Age-related osteoporosis without current pathological fracture: Secondary | ICD-10-CM | POA: Diagnosis not present

## 2019-10-07 DIAGNOSIS — G629 Polyneuropathy, unspecified: Secondary | ICD-10-CM | POA: Diagnosis not present

## 2019-10-07 DIAGNOSIS — C9000 Multiple myeloma not having achieved remission: Secondary | ICD-10-CM

## 2019-10-07 DIAGNOSIS — C9002 Multiple myeloma in relapse: Secondary | ICD-10-CM

## 2019-10-07 DIAGNOSIS — Z7952 Long term (current) use of systemic steroids: Secondary | ICD-10-CM | POA: Diagnosis not present

## 2019-10-07 DIAGNOSIS — M818 Other osteoporosis without current pathological fracture: Secondary | ICD-10-CM | POA: Diagnosis not present

## 2019-10-07 DIAGNOSIS — I252 Old myocardial infarction: Secondary | ICD-10-CM | POA: Diagnosis not present

## 2019-10-07 DIAGNOSIS — R739 Hyperglycemia, unspecified: Secondary | ICD-10-CM | POA: Diagnosis not present

## 2019-10-07 DIAGNOSIS — Z7982 Long term (current) use of aspirin: Secondary | ICD-10-CM | POA: Diagnosis not present

## 2019-10-07 DIAGNOSIS — Z5112 Encounter for antineoplastic immunotherapy: Secondary | ICD-10-CM | POA: Diagnosis not present

## 2019-10-07 DIAGNOSIS — Z79899 Other long term (current) drug therapy: Secondary | ICD-10-CM | POA: Diagnosis not present

## 2019-10-07 DIAGNOSIS — Z9481 Bone marrow transplant status: Secondary | ICD-10-CM | POA: Diagnosis not present

## 2019-10-07 LAB — CBC WITH DIFFERENTIAL (CANCER CENTER ONLY)
Abs Immature Granulocytes: 0.01 10*3/uL (ref 0.00–0.07)
Basophils Absolute: 0 10*3/uL (ref 0.0–0.1)
Basophils Relative: 1 %
Eosinophils Absolute: 0.1 10*3/uL (ref 0.0–0.5)
Eosinophils Relative: 2 %
HCT: 30 % — ABNORMAL LOW (ref 36.0–46.0)
Hemoglobin: 10 g/dL — ABNORMAL LOW (ref 12.0–15.0)
Immature Granulocytes: 0 %
Lymphocytes Relative: 6 %
Lymphs Abs: 0.2 10*3/uL — ABNORMAL LOW (ref 0.7–4.0)
MCH: 39.2 pg — ABNORMAL HIGH (ref 26.0–34.0)
MCHC: 33.3 g/dL (ref 30.0–36.0)
MCV: 117.6 fL — ABNORMAL HIGH (ref 80.0–100.0)
Monocytes Absolute: 0.3 10*3/uL (ref 0.1–1.0)
Monocytes Relative: 12 %
Neutro Abs: 2.2 10*3/uL (ref 1.7–7.7)
Neutrophils Relative %: 79 %
Platelet Count: 172 10*3/uL (ref 150–400)
RBC: 2.55 MIL/uL — ABNORMAL LOW (ref 3.87–5.11)
RDW: 15.3 % (ref 11.5–15.5)
WBC Count: 2.8 10*3/uL — ABNORMAL LOW (ref 4.0–10.5)
nRBC: 0 % (ref 0.0–0.2)

## 2019-10-07 LAB — CMP (CANCER CENTER ONLY)
ALT: 11 U/L (ref 0–44)
AST: 18 U/L (ref 15–41)
Albumin: 4 g/dL (ref 3.5–5.0)
Alkaline Phosphatase: 67 U/L (ref 38–126)
Anion gap: 12 (ref 5–15)
BUN: 16 mg/dL (ref 8–23)
CO2: 19 mmol/L — ABNORMAL LOW (ref 22–32)
Calcium: 8.7 mg/dL — ABNORMAL LOW (ref 8.9–10.3)
Chloride: 103 mmol/L (ref 98–111)
Creatinine: 0.73 mg/dL (ref 0.44–1.00)
GFR, Est AFR Am: >60 mL/min (ref >60)
GFR, Estimated: >60 mL/min (ref >60)
Glucose, Bld: 117 mg/dL — ABNORMAL HIGH (ref 70–99)
Potassium: 4.1 mmol/L (ref 3.5–5.1)
Sodium: 134 mmol/L — ABNORMAL LOW (ref 135–145)
Total Bilirubin: 0.8 mg/dL (ref 0.3–1.2)
Total Protein: 6 g/dL — ABNORMAL LOW (ref 6.5–8.1)

## 2019-10-07 MED ORDER — SODIUM CHLORIDE 0.9 % IV SOLN
Freq: Once | INTRAVENOUS | Status: AC
  Filled 2019-10-07: qty 250

## 2019-10-07 MED ORDER — DEXAMETHASONE 4 MG PO TABS
20.0000 mg | ORAL_TABLET | Freq: Once | ORAL | Status: AC
  Administered 2019-10-07: 20 mg via ORAL

## 2019-10-07 MED ORDER — PALONOSETRON HCL INJECTION 0.25 MG/5ML
INTRAVENOUS | Status: AC
  Filled 2019-10-07: qty 5

## 2019-10-07 MED ORDER — DEXAMETHASONE 4 MG PO TABS
ORAL_TABLET | ORAL | Status: AC
  Filled 2019-10-07: qty 5

## 2019-10-07 MED ORDER — DEXTROSE 5 % IV SOLN
69.0000 mg/m2 | Freq: Once | INTRAVENOUS | Status: AC
  Administered 2019-10-07: 110 mg via INTRAVENOUS
  Filled 2019-10-07: qty 30

## 2019-10-07 MED ORDER — PALONOSETRON HCL INJECTION 0.25 MG/5ML
0.2500 mg | Freq: Once | INTRAVENOUS | Status: AC
  Administered 2019-10-07: 0.25 mg via INTRAVENOUS

## 2019-10-07 NOTE — Patient Instructions (Addendum)
[facility] Discharge Instructions for Patients Receiving Chemotherapy  Today you received the following chemotherapy agents: Kyprolis  To help prevent nausea and vomiting after your treatment, we encourage you to take your nausea medication as directed.   If you develop nausea and vomiting that is not controlled by your nausea medication, call the clinic.   BELOW ARE SYMPTOMS THAT SHOULD BE REPORTED IMMEDIATELY:  *FEVER GREATER THAN 100.5 F  *CHILLS WITH OR WITHOUT FEVER  NAUSEA AND VOMITING THAT IS NOT CONTROLLED WITH YOUR NAUSEA MEDICATION  *UNUSUAL SHORTNESS OF BREATH  *UNUSUAL BRUISING OR BLEEDING  TENDERNESS IN MOUTH AND THROAT WITH OR WITHOUT PRESENCE OF ULCERS  *URINARY PROBLEMS  *BOWEL PROBLEMS  UNUSUAL RASH Items with * indicate a potential emergency and should be followed up as soon as possible.  Feel free to call the clinic should you have any questions or concerns. The clinic phone number is [phone].  Please show the [place] at check-in to the Emergency Department and triage nurse.   IV Infiltration IV infiltration is when fluid from your IV leaks under your skin. This can happen if the IV needle comes out of the vein or if the IV pokes through the vein to the other side. What are the causes?  Your IV needle is not inserted in the correct position.  Your IV needle moves after being positioned correctly.  You have swelling (inflammation) or blood flow problems near the IV needle. What are the signs or symptoms?  Pain at the IV site.  Skin at the IV site that is: ? Swollen. ? Tight. ? Stiff.  Skin at the IV site that is: ? Red. ? White. ? Cool-feeling.  A damp or wet bandage (dressing), if you have one.  An IV that works more slowly than normal.  Burning or loss of skin at the IV site. This may happen in very bad cases. How is this treated?  Treatment for mild cases may include: ? Removing your IV. ? Applying  a cold compress. ? Applying a warm compress. ? Keeping your arm raised (elevated) when at rest. ? Wearing a bandage if your injection site is leaking. ? Injecting medicine into the area to help your body absorb the leaking fluid.  Treatment for very bad cases may include surgery to remove skin. Follow these instructions at home: Managing pain, stiffness, and swelling      If told, put ice on the swollen area. ? Put ice in a plastic bag. ? Place a towel between your skin and the bag. ? Leave the ice on for 20 minutes, 2-3 times a day.  Raise the swollen area above the level of your heart while you are sitting or lying down.  If told, put heat on the swollen area. Do this as often as told by your doctor. Use the heat source that your doctor recommends, such as a moist heat pack or a heating pad. ? Place a towel between your skin and the heat source. ? Leave the heat on for 20-30 minutes ? Remove the heat if your skin turns bright red. This is very important if you are unable to feel pain, heat, or cold. You may have a greater risk of getting burned. General instructions  Take over-the-counter and prescription medicines only as told by your doctor.  Do not wear tight-fitting clothing, watches, or jewelry near the swollen area. Contact a doctor if:  You have a fever.  Your skin at the  IV site becomes: ? Swollen. ? Red. ? Painful.  Your skin feels numb.  Your skin feels tight or cool to the touch.  You have blisters around the IV site.  Your skin changes color or is bruised.  You cannot feel your pulse where the IV was placed. Get help right away if:  Your skin around the IV site turns dark and peels.  You have red streaks on your skin coming from the IV site.  You have pus coming from the IV site. Summary  IV infiltration is when fluid from your IV leaks under your skin.  This can happen if the IV needle comes out of the vein or if the IV pokes through the vein  to the other side.  Treatment may include removing your IV, putting ice or a warm compress on the area, and wearing a bandage if your injection site is leaking.  Take over-the-counter and prescription medicines only as told by your doctor.  Contact a doctor if your skin at the IV site becomes swollen, red, or painful. This information is not intended to replace advice given to you by your health care provider. Make sure you discuss any questions you have with your health care provider. Document Revised: 04/30/2018 Document Reviewed: 04/30/2018 Elsevier Patient Education  [place].

## 2019-10-07 NOTE — Progress Notes (Signed)
At 1645, IV pump signaling "occluded pt side". Site assessed, and swelling noted. No redness, warmth, or tenderness. Kyprolis stopped. Attempted to aspirate, able to remove 0.59ml of fluid. Area measures 3.5cm x 3cm. Pharmacy notified for further instruction. Per pharmacy, medication is known as an irritant, and instructed to apply ice. Pharmacy also spoke with MD, and MD in agreement with intervention. Dr. Maylon Peppers arrived at aproximately 1720 to assess site. He stated that medication could be irritating, but felt that there was no further concern. Stated to apply cold pack. MD also stated that pt did not need to return for follow-up unless she experienced worsening symptoms. Cold pack applied and patient instructed to observe site and to call clinic for any symptoms such as increased swelling, redness, tenderness, or drainage. Pt verbalized understanding. New IV site obtained and infusion resumed. Area of swelling improved by end of treatment.

## 2019-10-08 ENCOUNTER — Telehealth: Payer: Self-pay | Admitting: Hematology

## 2019-10-08 MED FILL — CYCLOPHOSPHAMIDE 50 MG CAPS: 50 | 28 days supply | Qty: 24 | Fill #0

## 2019-10-08 NOTE — Telephone Encounter (Signed)
Scheduled appt per 3/25 los.  Patient will get an updated appt calendar at their next scheduled appt.

## 2019-10-08 NOTE — Progress Notes (Signed)
Pharmacist Chemotherapy Monitoring - Follow Up Assessment    I verify that I have reviewed each item in the below checklist:  . Regimen for the patient is scheduled for the appropriate day and plan matches scheduled date. Marland Kitchen Appropriate non-routine labs are ordered dependent on drug ordered. . If applicable, additional medications reviewed and ordered per protocol based on lifetime cumulative doses and/or treatment regimen.   Plan for follow-up and/or issues identified: No . I-vent associated with next due treatment: Yes . MD and/or nursing notified: No   Kennith Center, Pharm.D., CPP 10/08/2019@4 :28 PM

## 2019-10-11 LAB — MULTIPLE MYELOMA PANEL, SERUM
Albumin SerPl Elph-Mcnc: 3.9 g/dL (ref 2.9–4.4)
Albumin/Glob SerPl: 2.2 — ABNORMAL HIGH (ref 0.7–1.7)
Alpha 1: 0.3 g/dL (ref 0.0–0.4)
Alpha2 Glob SerPl Elph-Mcnc: 0.5 g/dL (ref 0.4–1.0)
B-Globulin SerPl Elph-Mcnc: 0.8 g/dL (ref 0.7–1.3)
Gamma Glob SerPl Elph-Mcnc: 0.3 g/dL — ABNORMAL LOW (ref 0.4–1.8)
Globulin, Total: 1.8 g/dL — ABNORMAL LOW (ref 2.2–3.9)
IgA: <5 mg/dL — ABNORMAL LOW (ref 64–422)
IgG (Immunoglobin G), Serum: 345 mg/dL — ABNORMAL LOW (ref 586–1602)
IgM (Immunoglobulin M), Srm: <5 mg/dL — ABNORMAL LOW (ref 26–217)
M Protein SerPl Elph-Mcnc: 0.1 g/dL — ABNORMAL HIGH
Total Protein ELP: 5.7 g/dL — ABNORMAL LOW (ref 6.0–8.5)

## 2019-10-14 ENCOUNTER — Inpatient Hospital Stay: Payer: Medicare Other | Attending: Medical

## 2019-10-14 ENCOUNTER — Inpatient Hospital Stay: Payer: Medicare Other

## 2019-10-14 ENCOUNTER — Other Ambulatory Visit: Payer: Self-pay

## 2019-10-14 VITALS — BP 113/67 | HR 82 | Temp 98.4°F | Resp 18 | Wt 114.2 lb

## 2019-10-14 DIAGNOSIS — C9002 Multiple myeloma in relapse: Secondary | ICD-10-CM

## 2019-10-14 DIAGNOSIS — Z923 Personal history of irradiation: Secondary | ICD-10-CM | POA: Diagnosis not present

## 2019-10-14 DIAGNOSIS — E785 Hyperlipidemia, unspecified: Secondary | ICD-10-CM | POA: Diagnosis not present

## 2019-10-14 DIAGNOSIS — Z7982 Long term (current) use of aspirin: Secondary | ICD-10-CM | POA: Diagnosis not present

## 2019-10-14 DIAGNOSIS — I252 Old myocardial infarction: Secondary | ICD-10-CM | POA: Diagnosis not present

## 2019-10-14 DIAGNOSIS — Z9481 Bone marrow transplant status: Secondary | ICD-10-CM | POA: Insufficient documentation

## 2019-10-14 DIAGNOSIS — Z9221 Personal history of antineoplastic chemotherapy: Secondary | ICD-10-CM | POA: Diagnosis not present

## 2019-10-14 DIAGNOSIS — Z79899 Other long term (current) drug therapy: Secondary | ICD-10-CM | POA: Diagnosis not present

## 2019-10-14 DIAGNOSIS — Z5112 Encounter for antineoplastic immunotherapy: Secondary | ICD-10-CM | POA: Insufficient documentation

## 2019-10-14 DIAGNOSIS — D61818 Other pancytopenia: Secondary | ICD-10-CM | POA: Insufficient documentation

## 2019-10-14 DIAGNOSIS — Z7952 Long term (current) use of systemic steroids: Secondary | ICD-10-CM | POA: Diagnosis not present

## 2019-10-14 DIAGNOSIS — C9000 Multiple myeloma not having achieved remission: Secondary | ICD-10-CM

## 2019-10-14 DIAGNOSIS — I2510 Atherosclerotic heart disease of native coronary artery without angina pectoris: Secondary | ICD-10-CM | POA: Diagnosis not present

## 2019-10-14 DIAGNOSIS — G620 Drug-induced polyneuropathy: Secondary | ICD-10-CM | POA: Insufficient documentation

## 2019-10-14 LAB — CBC WITH DIFFERENTIAL (CANCER CENTER ONLY)
Abs Immature Granulocytes: 0.05 10*3/uL (ref 0.00–0.07)
Basophils Absolute: 0 10*3/uL (ref 0.0–0.1)
Basophils Relative: 1 %
Eosinophils Absolute: 0.1 10*3/uL (ref 0.0–0.5)
Eosinophils Relative: 2 %
HCT: 30.1 % — ABNORMAL LOW (ref 36.0–46.0)
Hemoglobin: 9.8 g/dL — ABNORMAL LOW (ref 12.0–15.0)
Immature Granulocytes: 1 %
Lymphocytes Relative: 4 %
Lymphs Abs: 0.1 10*3/uL — ABNORMAL LOW (ref 0.7–4.0)
MCH: 38.6 pg — ABNORMAL HIGH (ref 26.0–34.0)
MCHC: 32.6 g/dL (ref 30.0–36.0)
MCV: 118.5 fL — ABNORMAL HIGH (ref 80.0–100.0)
Monocytes Absolute: 0.3 10*3/uL (ref 0.1–1.0)
Monocytes Relative: 8 %
Neutro Abs: 2.9 10*3/uL (ref 1.7–7.7)
Neutrophils Relative %: 84 %
Platelet Count: 88 10*3/uL — ABNORMAL LOW (ref 150–400)
RBC: 2.54 MIL/uL — ABNORMAL LOW (ref 3.87–5.11)
RDW: 15 % (ref 11.5–15.5)
WBC Count: 3.5 10*3/uL — ABNORMAL LOW (ref 4.0–10.5)
nRBC: 0 % (ref 0.0–0.2)

## 2019-10-14 LAB — CK TOTAL AND CKMB (NOT AT ARMC)
CK, MB: 4.4 ng/mL (ref 0.5–5.0)
Relative Index: INVALID (ref 0.0–2.5)
Total CK: 51 U/L (ref 38–234)

## 2019-10-14 LAB — CMP (CANCER CENTER ONLY)
ALT: 10 U/L (ref 0–44)
AST: 16 U/L (ref 15–41)
Albumin: 4 g/dL (ref 3.5–5.0)
Alkaline Phosphatase: 62 U/L (ref 38–126)
Anion gap: 14 (ref 5–15)
BUN: 15 mg/dL (ref 8–23)
CO2: 21 mmol/L — ABNORMAL LOW (ref 22–32)
Calcium: 9 mg/dL (ref 8.9–10.3)
Chloride: 105 mmol/L (ref 98–111)
Creatinine: 0.8 mg/dL (ref 0.44–1.00)
GFR, Est AFR Am: >60 mL/min (ref >60)
GFR, Estimated: >60 mL/min (ref >60)
Glucose, Bld: 105 mg/dL — ABNORMAL HIGH (ref 70–99)
Potassium: 4 mmol/L (ref 3.5–5.1)
Sodium: 140 mmol/L (ref 135–145)
Total Bilirubin: 0.8 mg/dL (ref 0.3–1.2)
Total Protein: 6.1 g/dL — ABNORMAL LOW (ref 6.5–8.1)

## 2019-10-14 MED ORDER — PALONOSETRON HCL INJECTION 0.25 MG/5ML
0.2500 mg | Freq: Once | INTRAVENOUS | Status: AC
  Administered 2019-10-14: 16:00:00 0.25 mg via INTRAVENOUS

## 2019-10-14 MED ORDER — DEXTROSE 5 % IV SOLN
69.0000 mg/m2 | Freq: Once | INTRAVENOUS | Status: AC
  Administered 2019-10-14: 16:00:00 110 mg via INTRAVENOUS
  Filled 2019-10-14: qty 30

## 2019-10-14 MED ORDER — DEXAMETHASONE 4 MG PO TABS
ORAL_TABLET | ORAL | Status: AC
  Filled 2019-10-14: qty 5

## 2019-10-14 MED ORDER — DEXAMETHASONE 4 MG PO TABS
20.0000 mg | ORAL_TABLET | Freq: Once | ORAL | Status: AC
  Administered 2019-10-14: 16:00:00 20 mg via ORAL

## 2019-10-14 MED ORDER — PALONOSETRON HCL INJECTION 0.25 MG/5ML
INTRAVENOUS | Status: AC
  Filled 2019-10-14: qty 5

## 2019-10-14 MED ORDER — SODIUM CHLORIDE 0.9 % IV SOLN
Freq: Once | INTRAVENOUS | Status: AC
  Filled 2019-10-14: qty 250

## 2019-10-14 NOTE — Patient Instructions (Signed)
[facility] Discharge Instructions for Patients Receiving Chemotherapy  Today you received the following chemotherapy agents: carfilzomib.  To help prevent nausea and vomiting after your treatment, we encourage you to take your nausea medication as directed.   If you develop nausea and vomiting that is not controlled by your nausea medication, call the clinic.   BELOW ARE SYMPTOMS THAT SHOULD BE REPORTED IMMEDIATELY:  *FEVER GREATER THAN 100.5 F  *CHILLS WITH OR WITHOUT FEVER  NAUSEA AND VOMITING THAT IS NOT CONTROLLED WITH YOUR NAUSEA MEDICATION  *UNUSUAL SHORTNESS OF BREATH  *UNUSUAL BRUISING OR BLEEDING  TENDERNESS IN MOUTH AND THROAT WITH OR WITHOUT PRESENCE OF ULCERS  *URINARY PROBLEMS  *BOWEL PROBLEMS  UNUSUAL RASH Items with * indicate a potential emergency and should be followed up as soon as possible.  Feel free to call the clinic should you have any questions or concerns. The clinic phone number is [phone].  Please show the CHEMO ALERT CARD at check-in to the Emergency Department and triage nurse.   

## 2019-10-14 NOTE — Progress Notes (Signed)
Per Dr Burr Medico, ok to treat with platelets 88 today.

## 2019-10-15 LAB — KAPPA/LAMBDA LIGHT CHAINS
Kappa free light chain: 9.6 mg/L (ref 3.3–19.4)
Kappa, lambda light chain ratio: 5.65 — ABNORMAL HIGH (ref 0.26–1.65)
Lambda free light chains: 1.7 mg/L — ABNORMAL LOW (ref 5.7–26.3)

## 2019-10-22 NOTE — Progress Notes (Signed)
Pharmacist Chemotherapy Monitoring - Follow Up Assessment    I verify that I have reviewed each item in the below checklist:  . Regimen for the patient is scheduled for the appropriate day and plan matches scheduled date. Marland Kitchen Appropriate non-routine labs are ordered dependent on drug ordered. . If applicable, additional medications reviewed and ordered per protocol based on lifetime cumulative doses and/or treatment regimen.   Plan for follow-up and/or issues identified: No . I-vent associated with next due treatment: Yes . MD and/or nursing notified: No  Adelina Mings 10/22/2019 11:23 AM

## 2019-10-27 DIAGNOSIS — S72112D Displaced fracture of greater trochanter of left femur, subsequent encounter for closed fracture with routine healing: Secondary | ICD-10-CM | POA: Diagnosis not present

## 2019-10-28 ENCOUNTER — Encounter: Payer: Self-pay | Admitting: Nurse Practitioner

## 2019-10-28 ENCOUNTER — Inpatient Hospital Stay: Payer: Medicare Other

## 2019-10-28 ENCOUNTER — Other Ambulatory Visit: Payer: Self-pay

## 2019-10-28 ENCOUNTER — Inpatient Hospital Stay: Payer: Medicare Other | Admitting: Nurse Practitioner

## 2019-10-28 VITALS — BP 91/52 | HR 78 | Temp 98.0°F | Resp 16

## 2019-10-28 VITALS — BP 119/72 | HR 92 | Temp 98.0°F | Resp 17 | Ht 67.0 in | Wt 112.7 lb

## 2019-10-28 DIAGNOSIS — C9002 Multiple myeloma in relapse: Secondary | ICD-10-CM

## 2019-10-28 DIAGNOSIS — C9000 Multiple myeloma not having achieved remission: Secondary | ICD-10-CM

## 2019-10-28 DIAGNOSIS — Z7952 Long term (current) use of systemic steroids: Secondary | ICD-10-CM | POA: Diagnosis not present

## 2019-10-28 DIAGNOSIS — Z7982 Long term (current) use of aspirin: Secondary | ICD-10-CM | POA: Diagnosis not present

## 2019-10-28 DIAGNOSIS — Z5112 Encounter for antineoplastic immunotherapy: Secondary | ICD-10-CM | POA: Diagnosis not present

## 2019-10-28 DIAGNOSIS — D61818 Other pancytopenia: Secondary | ICD-10-CM | POA: Diagnosis not present

## 2019-10-28 DIAGNOSIS — Z923 Personal history of irradiation: Secondary | ICD-10-CM | POA: Diagnosis not present

## 2019-10-28 DIAGNOSIS — E785 Hyperlipidemia, unspecified: Secondary | ICD-10-CM | POA: Diagnosis not present

## 2019-10-28 DIAGNOSIS — Z9481 Bone marrow transplant status: Secondary | ICD-10-CM | POA: Diagnosis not present

## 2019-10-28 DIAGNOSIS — Z79899 Other long term (current) drug therapy: Secondary | ICD-10-CM | POA: Diagnosis not present

## 2019-10-28 DIAGNOSIS — I2510 Atherosclerotic heart disease of native coronary artery without angina pectoris: Secondary | ICD-10-CM | POA: Diagnosis not present

## 2019-10-28 DIAGNOSIS — G620 Drug-induced polyneuropathy: Secondary | ICD-10-CM | POA: Diagnosis not present

## 2019-10-28 DIAGNOSIS — I252 Old myocardial infarction: Secondary | ICD-10-CM | POA: Diagnosis not present

## 2019-10-28 DIAGNOSIS — Z9221 Personal history of antineoplastic chemotherapy: Secondary | ICD-10-CM | POA: Diagnosis not present

## 2019-10-28 LAB — CBC WITH DIFFERENTIAL (CANCER CENTER ONLY)
Abs Immature Granulocytes: 0.01 10*3/uL (ref 0.00–0.07)
Basophils Absolute: 0 10*3/uL (ref 0.0–0.1)
Basophils Relative: 1 %
Eosinophils Absolute: 0 10*3/uL (ref 0.0–0.5)
Eosinophils Relative: 1 %
HCT: 30.6 % — ABNORMAL LOW (ref 36.0–46.0)
Hemoglobin: 9.7 g/dL — ABNORMAL LOW (ref 12.0–15.0)
Immature Granulocytes: 0 %
Lymphocytes Relative: 5 %
Lymphs Abs: 0.1 10*3/uL — ABNORMAL LOW (ref 0.7–4.0)
MCH: 37.9 pg — ABNORMAL HIGH (ref 26.0–34.0)
MCHC: 31.7 g/dL (ref 30.0–36.0)
MCV: 119.5 fL — ABNORMAL HIGH (ref 80.0–100.0)
Monocytes Absolute: 0.6 10*3/uL (ref 0.1–1.0)
Monocytes Relative: 20 %
Neutro Abs: 2.1 10*3/uL (ref 1.7–7.7)
Neutrophils Relative %: 73 %
Platelet Count: 176 10*3/uL (ref 150–400)
RBC: 2.56 MIL/uL — ABNORMAL LOW (ref 3.87–5.11)
RDW: 15.1 % (ref 11.5–15.5)
WBC Count: 2.8 10*3/uL — ABNORMAL LOW (ref 4.0–10.5)
nRBC: 0 % (ref 0.0–0.2)

## 2019-10-28 LAB — CMP (CANCER CENTER ONLY)
ALT: 9 U/L (ref 0–44)
AST: 15 U/L (ref 15–41)
Albumin: 3.8 g/dL (ref 3.5–5.0)
Alkaline Phosphatase: 56 U/L (ref 38–126)
Anion gap: 13 (ref 5–15)
BUN: 15 mg/dL (ref 8–23)
CO2: 23 mmol/L (ref 22–32)
Calcium: 9.2 mg/dL (ref 8.9–10.3)
Chloride: 108 mmol/L (ref 98–111)
Creatinine: 0.82 mg/dL (ref 0.44–1.00)
GFR, Est AFR Am: >60 mL/min (ref >60)
GFR, Estimated: >60 mL/min (ref >60)
Glucose, Bld: 97 mg/dL (ref 70–99)
Potassium: 3.8 mmol/L (ref 3.5–5.1)
Sodium: 144 mmol/L (ref 135–145)
Total Bilirubin: 0.8 mg/dL (ref 0.3–1.2)
Total Protein: 5.9 g/dL — ABNORMAL LOW (ref 6.5–8.1)

## 2019-10-28 MED ORDER — SODIUM CHLORIDE 0.9 % IV SOLN
Freq: Once | INTRAVENOUS | Status: AC
  Filled 2019-10-28: qty 250

## 2019-10-28 MED ORDER — DEXTROSE 5 % IV SOLN
69.0000 mg/m2 | Freq: Once | INTRAVENOUS | Status: AC
  Administered 2019-10-28: 110 mg via INTRAVENOUS
  Filled 2019-10-28: qty 30

## 2019-10-28 MED ORDER — DEXAMETHASONE 4 MG PO TABS
20.0000 mg | ORAL_TABLET | Freq: Once | ORAL | Status: AC
  Administered 2019-10-28: 20 mg via ORAL

## 2019-10-28 MED ORDER — PALONOSETRON HCL INJECTION 0.25 MG/5ML
0.2500 mg | Freq: Once | INTRAVENOUS | Status: AC
  Administered 2019-10-28: 0.25 mg via INTRAVENOUS

## 2019-10-28 MED ORDER — PALONOSETRON HCL INJECTION 0.25 MG/5ML
INTRAVENOUS | Status: AC
  Filled 2019-10-28: qty 5

## 2019-10-28 MED ORDER — DEXAMETHASONE 4 MG PO TABS
ORAL_TABLET | ORAL | Status: AC
  Filled 2019-10-28: qty 5

## 2019-10-28 NOTE — Progress Notes (Signed)
[name]   Telephone:([phone] [phone]   Clinic Follow up Note   Patient Care Team: [name], MD as PCP - General (Family Medicine) [name], MD as PCP - Cardiology (Cardiology) [name], MD as Attending Physician (Obstetrics and Gynecology) [name], MD as Consulting Physician (Internal Medicine) [name], MD as Consulting Physician (Hematology) [name], MD as Consulting Physician (Ophthalmology) [name], MD as Consulting Physician (Cardiology) [name], MD as Consulting Physician (Dermatology) [name] (Dentistry) [name], DPM as Consulting Physician (Podiatry) Lazaro Arms, RN as [place] Management Lazaro Arms, RN as Case Manager 10/28/2019  CHIEF COMPLAINT: F/u Multiple Myeloma   SUMMARY OF ONCOLOGIC HISTORY: Oncology History Overview Note  Multiple myeloma [facility])   Staging form: Multiple Myeloma, AJCC 6th Edition   - Clinical: Stage IIIA - Signed by [name], MD on 09/04/2013    Multiple myeloma ([place])  05/26/2012 Initial Diagnosis   Presenting IgG was 4,040 mg/dL on 06/05/2012 (IgA 35; IgM 34); kappa free light chain 34.4 mg/dL, lambda 0.00, kappa:lambda ratio of 34.75; SPEP with M-spike of 2.60.   06/30/2012 Imaging   Numerous lytic lesions throughout the calvarium.  Lytic lesion within the left lateral scapula.  Findings most compatible with metastases or myeloma. Multiple mid and lower thoracic compression fractures.  Slight compression through the endplates at L3.   45/40/9811 Bone Marrow Biopsy   Bone marrow biopsy showed 49% plasma cell. Normal classical cytogenetics; however, myeloma FISH panel showed 13q- (intermediate risk)       07/14/2012 - 07/27/2012 Radiation Therapy   Palliative radiation 20 Gy over 10 fractions between to thoracic spine cord compression and symptomatic left scapula lytic lesion.   08/10/2012 -  11/2012 Chemotherapy   Started SQ Velcade once weekly, 3 weeks on, 1 weeks off; daily Revlimid d1-21, 7 days off; and Dexamethasone '40mg'$  PO weekly.    02/12/2013 Bone Marrow Transplant   Auto bone marrow [facility] at [facility]     06/14/2013 Tumor Marker   IgG  1830,  Kappa:lamba ratio, 1.69 (baseline was 34.75 or   M-spike 0.28 (baseline of 2.6 or  89.3% of baseline).     06/25/2013 -  Chemotherapy   Started zometa 3.5 mg monthly    09/01/2013 - 01/2014 Chemotherapy   Maintenance therapy with revlimid '5mg'$  daily for 14 days and then off for 7 (decreased from 21 days on and 7 days off based on neutropenia). Stopped due to disease progression 01/2017   09/13/2013 - 09/17/2014 Hospital Admission   Hospital admission for pneumonia   12/20/2013 Treatment Plan Change   Maintenance therapy decreased to 2.5 mg daily for 21 days and then off for 7 days based on low counts.    01/25/2014 Tumor Marker   IgG 1360 M spike 0.5   03/01/2014 - 03/05/2014 Hospital Admission   [facility] with an inferior STEMI, received thrombolytic therapy, cath showed 60% prox RCA, mid-distal RCA 50%, Echo normal, no stents.   04/11/2014 - 08/07/2014 Chemotherapy   Continue Revlimid at 2.5 mg 3 weeks on 1 week off   08/08/2014 - 08/13/2014 Hospital Admission   Hospital admission for pneumonia. Her Revlimid was held.   08/29/2014 - 09/14/2014 Chemotherapy   Maintenance Revlimid restarted   09/14/2014 - 09/17/2014 Hospital Admission   Admitted for pneumonia after coming back from a trip in [name]. Revlimid was held again.   11/13/2014 - 12/25/2015 Chemotherapy   Maintenance Revlimid restarted,  changed to 2.'5mg'$  daily, 2 weks on, 1 week off from 12/15/2014, stopped due to disease progression    01/24/2016 Progression   Patient is M protein has gradually increased to 1.0g, repeated a bone marrow biopsy showed plasma cell 10-30%   02/27/2016 - 03/07/2016 Chemotherapy   Pomalidomide 4 mg daily on day 1-21,  dexamethasone 40 mg on day 1, 8 and 15, every 28 days, started on 02/27/2016, daratumumab weekly started on 03/07/2016, held after first dose, due to hospitalization and a severe pancytopenia, pomalidomide was stopped afterwards    03/10/2016 - 03/29/2016 Hospital Admission   Pt was admitted for sepsis from pneumonia, and severe pancytopenia. She required ICU stay for a few days due to hypotension, developed b/l pleural effusion required diuretics and b/l thoracentesis. She also required blood and plt transfusion, and prolonged neupogen injection for severe neutropenia. She was discharged home after 3 weeks hospital stay    04/26/2016 - 04/26/2019 Chemotherapy   Daratumumab restarted on 04/26/2016, Velcade 1.'3mg'$ /m2 and dexa '20mg'$  weekly start on 11/3, velcade held since 06/28/2016 due to CHF, dexa stopped on12/11/2017 due to high risk of fracture.  Dara reduced to every 4 weeks due to COVID-19 starting 10/23/18. Returned to every 3 weeks starting 12/17/18. Restarted every 2 weeks starting 02/22/19 Stopped on 04/26/19 due to disease progression.    08/26/2016 Echocardiogram   - Left ventricle: LVEF is approximately 35% with diffuse diffuse   hypokinesis with severe hypokinesis/ akinesis of the   inferior/inferoseptal walls. In comparison to echo images from   December 2017, there does not appear to be significant change The   cavity size was normal. Wall thickness was normal. Doppler   parameters are consistent with abnormal left ventricular   relaxation (grade 1 diastolic dysfunction). - Aortic valve: AV is thickened. 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 Present in echo from December 2017 There was   trivial regurgitation. - Right ventricle: Systolic function was mildly reduced. - Right atrium: The atrium was mildly dilated. - Pericardium, extracardiac: A trivial pericardial effusion was   identified.   12/03/2016 Echocardiogram    -Left Ventricle: Systolic function was   mildly to moderately reduced. The estimated ejection fraction was   in the range of 40% to 45%. Diffuse hypokinesis. Doppler   parameters are consistent with abnormal left ventricular   relaxation (grade 1 diastolic dysfunction). Doppler parameters   are consistent with indeterminate ventricular filling pressure. - Left atrium: The atrium was severely dilated. - Tricuspid valve: There was trivial regurgitation. - Pulmonary arteries: Systolic pressure was within the normal   range. PA peak pressure: 33 mm Hg (S). - Global longitudinal strain -10.5% (abnormal)   03/12/2017 Echocardiogram   ECHO 03/12/17 Study Conclusions - Left ventricle: The cavity size was normal. Wall thickness was   increased in a pattern of mild LVH. Systolic function was normal.   The estimated ejection fraction was in the range of 50% to 55%.   There is hypokinesis of the basalinferior myocardium. Doppler   parameters are consistent with abnormal left ventricular   relaxation (grade 1 diastolic dysfunction). - Aortic valve: There was trivial regurgitation. - Mitral valve: Calcified annulus. Mildly thickened leaflets .   There was trivial regurgitation. - Tricuspid valve: There was mild regurgitation. Impressions: - There has been mild improvement in EF since prior study.    12/12/2017 - 12/15/2017 Hospital Admission   Admission diagnosis: Pnuemonia and acute hypoxic respiratory failure Additional  comments: treated for pneumoania and acute hypoxic respiratory failure before she was discharged on 12/15/17 with oral anitbiotics. She did not require home oxygen.    12/30/2017 - 01/02/2018 Hospital Admission   Admission diagnosis: Pnuemonia  Additional comments: She was again hospitalized on 12/30/17 for pneumonia, ID work up was otherwise negative, she was discharged home with oral antibiotics.    04/01/2018 Imaging   04/01/2018 CXR IMPRESSION: Persisting airspace disease  superimposed on chronic changes, suggesting pneumonia given the history. Followup PA and lateral chest X-ray is recommended in 3-4 weeks following therapy to ensure resolution and exclude underlying malignancy.  Redemonstration of mid and lower thoracic compression fractures with associated thoracic kyphosis.   04/23/2018 Echocardiogram   04/23/2018 ECHO LV EF: 45% -   50%   01/07/2019 Imaging   MRI Brain 01/07/19 IMPRESSION: MRI HEAD IMPRESSION:   1. 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 Lesion closely approximates the left inner ear structures and involves the left Meckel's cave and left fifth cranial nerve, and most likely is the causative etiology for patient's underlying left ear and facial symptoms. Follow-up examination with postcontrast imaging suggested for complete evaluation. Inclusion of IAC sequences would likely be helpful for visualization. 2. Additional 2.7 cm lesion involving the right occipital calvarium with adjacent marrow signal abnormality, also likely related to multiple myeloma. Metastatic disease would be the primary differential consideration. 3. Underlying age-appropriate cerebral atrophy with mild chronic microvascular ischemic disease. No other acute intracranial abnormality.   MRA HEAD IMPRESSION:   1. Negative intracranial MRA with no large vessel occlusion, hemodynamically significant stenosis, or other acute vascular abnormality. 2. Petrous and cavernous left ICA is partially encased and surrounded by the left petrous apex lesion without significant irregularity or stenosis. 3. 2 mm right paraophthalmic aneurysm.   MRA NECK IMPRESSION:   Normal MRA of the neck with wide patency of the carotid and vertebral arteries bilaterally. No hemodynamically significant stenosis or other acute vascular abnormality  identified.    01/14/2019 - 01/27/2019 Radiation Therapy   By Dr. Tammi Klippel.    Diagnosis:   79 yo woman with skull base myeloma lesion causing left ear pain      Indication for treatment:  Palliative        Radiation treatment dates:   01/14/2019 - 01/27/2019   Site/dose:   Skull Base / 20 Gy in 10 fractions   Beams/energy:   3D, photons / 10X, 6X   04/01/2019 Pathology Results   Bone Marrow biopsy 04/01/19  DIAGNOSIS: BONE MARROW, ASPIRATE, CLOT, CORE: - Normocellular marrow with minimal involvement by plasma cell neoplasm - See comment PERIPHERAL BLOOD: - Macrocytic anemia and leukopenia -See complete blood count   04/09/2019 PET scan   PET 04/09/19  IMPRESSION: 1. Scattered lytic lesions of bone. A few of these demonstrate hypermetabolic activity, most notably the right occipital bone lesion; the lytic lesion in the approximately C7 spinous process; the lytic expansile lesion of the left fourth rib anteriorly; and a small lytic focus in the right femoral head. Other scattered lytic lesions have only low-grade activity. 2. Multiple fractures as detailed above. 3. Other imaging findings of potential clinical significance: Aortic Atherosclerosis (ICD10-I70.0). Coronary atherosclerosis.   05/10/2019 -  Chemotherapy   -CyBorD (cyclophosphamide (CYTOXAN) '500mg'$  ('300mg'$ /m2), Velcade 1.'3mg'$ /m2, and dexa '20mg'$ , weekly) starting 05/24/19.   -Due to neuropathy after 2 doses of Velcade, I switched to weekly  Kyprolis 3 weeks on/1 week off on 06/14/19.   -Chemo held for RT 07/05/19-07/22/19.   -Dexa stopped and Cytoxan dose reduced to 400 mg on day 1, 8 and 15 every 28 days, from cycle 3.     07/05/2019 - 07/20/2019 Radiation Therapy   By Dr. Tammi Klippel   Diagnosis:   79 yo woman with painful right occipital and left hip involvement by multiple myeloma      Indication for treatment:  Palliation        Radiation treatment dates:   07/05/19-07/20/19   Site/dose:    1.  The Right Occipital skull  lesion was treated to 20 Gy in 20 fractions of 2 Gy 2.  The Left Hip lesion was treated to 20 Gy in 20 fractions of 2 Gy   Beams/energy:      1.  The Right Occipital skull lesion was treated using a 3-field 3D set-up 2.  The Left Hip lesion was treated anterior and posterior fields.     CURRENT THERAPY:  -CyBorD (cyclophosphamide(CYTOXAN)536m (3075mm2),Velcade 1.29m82m2, and dexa 49m45meekly) starting 05/24/19. -Due toneuropathyafter 2 doses of Velcade, I switched toweeklyKyprolis 3 weeks on/1 week off on 06/14/19.Reduced to 2 weeks on/1 week off starting 09/16/19 due to thrombocytopenia.   -Chemo heldfor RT 07/05/19-07/22/19. -Dexareduced to 49mg129mytoxan dose reduced to 400 mg on day 1, 8 and 15 every 28 days, from cycle 3on 08/19/19, Cytoxan reduced to 300mg 16may 1, 8 every 21 days after 09/02/19.  -Restart Zometa injections every6 weekson 06/21/19.  INTERVAL HISTORY: Ms. Valvano Sedamns for f/u and treatment as scheduled. She completed cycle 5 kyprolis/cytoxan/dex on 10/14/19. She's doing about the same. On days 2-3 after treatment she has fatigue, low appetite and "a touch of nausea" without vomiting. Denies constipation or diarrhea. She has no taste for certain foods but continues to eat and drink. She does not like supplements. Denies mucositis. She improves after that. She continues to ride her bike to appointments. She has bruising but no bleeding. Neuropathy is stable, more in feet than hands, relatively unchanged on this regimen. She has persistent but stable pain on her left side, continues 1/2 tab morphine PRN, pain med requirements fluctuate. Otherwise, denies fever, chills, new cough, chest pain, dyspnea.    MEDICAL HISTORY:  Past Medical History:  Diagnosis Date  . CAD (coronary artery disease)    [place] LHC (8/15- [facility] [place] 60%, mid to dist RCA 50% >>> med rx  .  Cardiomy[place] (HCC)  [place]   . Chronic combined systolic and diastolic CHF (congestive heart failure) (HCC)  [place] related   . Cord compression (HCC) 1[place]/13   MRI- diffuse myeloma involvement of T-L spine  . History of radiation therapy 07/13/12-07/27/12   spinal cord compression T3-T10,left scapula  . 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 Multiple myeloma (HCC) 1[place]/2013  . MVP (mitral valve prolapse)    a. trivial by echo 06/2017.  . OSTEOPOROSIS 06/11/2010   Multiple compression fractures; and spontaneous fracture of sternum Qualifier: Diagnosis of  By: Saxon Zebedee Ibausan [name] Thoracic kyphosis 07/13/12   per MRI scan  . Unspecified deficiency anemia     SURGICAL HISTORY: Past Surgical History:  Procedure Laterality Date  . APPENDECTOMY    . CATARACT EXTRACTION, BILATERAL    . CESAREAN SECTION     x2   . COLONOSCOPY  2007  neg with Dr. [name]  . ELBOW SURGERY    . HIP SURGERY  2009   left  . TUBAL LIGATION      I have reviewed the social history and family history with the patient and they are unchanged from previous note.  ALLERGIES:  is allergic to zithromax [azithromycin]; zosyn [piperacillin sod-tazobactam so]; and quinolones.  MEDICATIONS:  Current Outpatient Medications  Medication Sig Dispense Refill  . acetaminophen (TYLENOL) 500 MG tablet Take 500 mg by mouth every 6 (six) hours as needed for moderate pain or fever.    Marland Kitchen acyclovir (ZOVIRAX) 400 MG tablet Take 1 tablet (400 mg total) by mouth 2 (two) times daily. 180 tablet 3  . aspirin EC 81 MG tablet Take 1 tablet (81 mg total) by mouth daily. 90 tablet 3  . atorvastatin (LIPITOR) 20 MG tablet Take 1 tablet (20 mg total) by mouth daily. (Patient taking differently: Take 20 mg by mouth at bedtime. ) 10 tablet 0  . calcium-vitamin D (OSCAL WITH D) 500-200 MG-UNIT TABS tablet Take by mouth.    . cyclophosphamide (CYTOXAN) 50 MG capsule Take 8 capsules (400 mg) by mouth with breakfast once  weekly, 3 weeks on and one week off. Take with food to minimize GI upset. Take early in the day and maintain hydration. (Patient taking differently: Take 400 mg by mouth See admin instructions. Take 8 capsules (400 mg) by mouth with breakfast once weekly, 3 weeks on and one week off. Take with food to minimize GI upset. Take early in the day and maintain hydration.) 24 capsule 1  . dexamethasone (DECADRON) 4 MG tablet Take 5 tablets (20 mg total) by mouth once a week. Take on the day of chemo 20 tablet 3  . doxycycline (VIBRA-TABS) 100 MG tablet Take 100 mg by mouth as needed (treatment for pneumonia).     . metoprolol succinate (TOPROL-XL) 25 MG 24 hr tablet Take 1 tablet (25 mg total) by mouth daily. 15 tablet 0  . morphine (MSIR) 15 MG tablet Take 0.5 tablets (7.5 mg total) by mouth every 4 (four) hours as needed for severe pain. 4 tablet 0  . ondansetron (ZOFRAN) 8 MG tablet Take 1 tablet (8 mg total) by mouth every 8 (eight) hours as needed for nausea or vomiting. 20 tablet 3  . prochlorperazine (COMPAZINE) 10 MG tablet Take 1 tablet (10 mg total) by mouth every 6 (six) hours as needed (Nausea or vomiting). 30 tablet 1   No current facility-administered medications for this visit.   Facility-Administered Medications Ordered in Other Visits  Medication Dose Route Frequency Provider Last Rate Last Admin  . carfilzomib (KYPROLIS) 110 mg in dextrose 5 % 100 mL chemo infusion  69 mg/m2 (Treatment Plan Recorded) Intravenous Once [name], MD 310 mL/hr at 10/28/19 1601 110 mg at 10/28/19 1601    PHYSICAL EXAMINATION: ECOG PERFORMANCE STATUS: 1 - Symptomatic but completely ambulatory  Vitals:   10/28/19 1331  BP: 119/72  Pulse: 92  Resp: 17  Temp: 98 F (36.7 C)  SpO2: 98%   Filed Weights   10/28/19 1331  Weight: 112 lb 11.2 oz (51.1 kg)    GENERAL:alert, no distress and comfortable SKIN: generalized bruising. No rash  EYES: sclera clear LUNGS:  normal breathing effort NEURO: alert  & oriented x 3 with fluent speech, steady gait  Limited exam for COVID19 pandemic   LABORATORY DATA:  I have reviewed the data as listed CBC Latest Ref Rng & Units 10/28/2019 10/14/2019 10/07/2019  WBC 4.0 - 10.5 K/uL 2.8(L) 3.5(L) 2.8(L)  Hemoglobin 12.0 - 15.0 g/dL 9.7(L) 9.8(L) 10.0(L)  Hematocrit 36.0 - 46.0 % 30.6(L) 30.1(L) 30.0(L)  Platelets 150 - 400 K/uL 176 88(L) 172     CMP Latest Ref Rng & Units 10/28/2019 10/14/2019 10/07/2019  Glucose 70 - 99 mg/dL 97 105(H) 117(H)  BUN 8 - 23 mg/dL _0 Creatinine 0.44 - 1.00 mg/dL 0.82 0.80 0.73  Sodium 135 - 145 mmol/L 144 140 134(L)  Potassium 3.5 - 5.1 mmol/L 3.8 4.0 4.1  Chloride 98 - 111 mmol/L 108 105 103  CO2 22 - 32 mmol/L 23 21(L) 19(L)  Calcium 8.9 - 10.3 mg/dL 9.2 9.0 8.7(L)  Total Protein 6.5 - 8.1 g/dL 5.9(L) 6.1(L) 6.0(L)  Total Bilirubin 0.3 - 1.2 mg/dL 0.8 0.8 0.8  Alkaline Phos 38 - 126 U/L 56 62 67  AST 15 - 41 U/L _1 ALT 0 - 44 U/L _2 RADIOGRAPHIC STUDIES: I have personally reviewed the radiological images as listed and agreed with the findings in the report. No results found.   ASSESSMENT & PLAN: Shannon Rush a 77F.o.femalewith    1. IgG kappa Multiple myeloma s/p Auto SCT, intermediate risk, relapsed in 01/2016 -Diagnosed in 05/2012. Treated with induction chemo, radiation and BM transplant, but unfortunately progressed while on maintenance Revlimid 01/2016. She is currently onDaratumumabmonotherapy -Dexa was stoppeddue to her high risk of fracture, and well controlled MM -During COVID19 treatment changed toevery 4 weeks, and changed back to every 3 weeks in early June -Due toher slight disease progressionandsymptomatic bone lesion in left petrous apex, Dr. Burr Medico changed Dara back to q2 weeks. -Due to increase in M protein from 0.3 to 0.6, she underwent restaging bone marrow biopsy in 03/2019 which shows normocellular marrow and minimal involvement by plasma cell neoplasm  (2%) -She also underwent PET scan in 03/2019 which shows scattered lytic lesions, some of which show hypermetabolic activity. -M spike up to 1.1 on 06/07/19 and she developed worsening cytopenias, consistent with progression -She started CyBorD on 05/24/19. She developed worsening neuropathy after first velcade injand it was changed to kyprolis after second dose. Now s/p cycle 5 -Ms. Cronce appears unchanged. She completed cycle 5 Kyprolis/cytoxan/dex. She tolerates treatment well with mild fatigue, low appetite, and nausea. She is able to recover well.  -CBC and CMP are adequate to proceed with cycle 6 day 1 today. She will return for lab and day 8 next week.  -Her M protein has improved to 0.1 on 10/07/19. She is responding well to treatment. If M protein remains low and stable after approx 6 months of chemo, will reduce her chemo. I reviewed the plan with Dr. Burr Medico.  -we will see her back in 3 weeks to f/u before next cycle  2. Left petrous apex bone lesionand right occipital lesion -Her inner ear issues and dizziness progressed and she developedmildbalance issueand nausea  -Her MRI brain from 01/07/19 showedabnormal osseous lesion measuring up to approximately 5.9 cm centered at the left petrous apex. Thismost likely is the cause of her underlyingleft ear and balancesymptoms. -S/ptarget RT with Dr Tammi Klippel 7/1-7/15. -She has stable muffled hearing and mild imbalanceissue, no significant improvement after RT -S/p RT to right occipital lesion per Dr. Tammi Klippel starting 12/21 - 07/20/19  3. History of multiple pneumonia andbronchitis -She has had multiple episodes of pneumoniaand bronchitis, required hospitalizationandantibiotics -Her vaccines are up-to-date.  4. Pancytopenia -Secondary to MM and  treatment  -Stable anemia. Thrombocytopenia and neutropenia resolved today   5. CHF, CAD, inferior STEMI in 02/2014, HLD -She is on ASA, beta blocker, statin -f/u with Dr. Meda Coffee and  continue medications -normal EF and improved strain on echo from 04/27/19, per cardiology PA ok to do echo q6 months for now -CKMB in normal range, will monitor periodically on Kyprolis   6. Osteoporosis with multiple fractures -Last DEXA in 2015 showed worsening osteoporosis. She declined repeat DEXA. -She had a fall induced fracture in 01/2018. -She was on Zometa for 4 years, stopped after April 2018 -she developed left hip pain, Xray on 11/30 negative for new fracture, hardware is intact. This is likely related to MM -RestartedZometa on 06/21/19, dose-adjusted for CrCl, continue q6 weeks -S/p palliative RT to left hip and right occipital skull per Dr. Tammi Klippel  From 07/05/19 - 07/20/19, left hip pain improved  7. Peripheral neuropathy in feet, G1-2 -Secondary to previous chemo, tuning fork exam shows decreased vibratory sense up to her ankle.  -previouslydeclined gabapentin -Worsened on Velcade which was switched to Kyprolis after 2 doses -Stable on Kyprolis, able to function well  8. Goal of care discussion  -full code -treatment goal is palliative   PLAN: -Labs reviewed -Proceed with cycle 6 day 1 kyprolis/cytoxan/dex today -Return for lab and day 8 on 4/22 -F/u in 3 weeks with cycle 7 -If MM panel remains low and stable after approx 6 months of chemo, will likely reduce intensity/frequency   All questions were answered. The patient knows to call the clinic with any problems, questions or concerns. No barriers to learning was detected.     Alla Feeling, NP 10/28/19

## 2019-10-28 NOTE — Patient Instructions (Signed)
[facility] Discharge Instructions for Patients Receiving Chemotherapy  Today you received the following chemotherapy agents Carfilzomib (KYPROLIS).  To help prevent nausea and vomiting after your treatment, we encourage you to take your nausea medication as prescribed.  If you develop nausea and vomiting that is not controlled by your nausea medication, call the clinic.   BELOW ARE SYMPTOMS THAT SHOULD BE REPORTED IMMEDIATELY:  *FEVER GREATER THAN 100.5 F  *CHILLS WITH OR WITHOUT FEVER  NAUSEA AND VOMITING THAT IS NOT CONTROLLED WITH YOUR NAUSEA MEDICATION  *UNUSUAL SHORTNESS OF BREATH  *UNUSUAL BRUISING OR BLEEDING  TENDERNESS IN MOUTH AND THROAT WITH OR WITHOUT PRESENCE OF ULCERS  *URINARY PROBLEMS  *BOWEL PROBLEMS  UNUSUAL RASH Items with * indicate a potential emergency and should be followed up as soon as possible.  Feel free to call the clinic should you have any questions or concerns. The clinic phone number is [phone].  Please show the CHEMO ALERT CARD at check-in to the Emergency Department and triage nurse.   

## 2019-10-29 ENCOUNTER — Telehealth: Payer: Self-pay | Admitting: Nurse Practitioner

## 2019-10-29 NOTE — Progress Notes (Unsigned)
Pharmacist Chemotherapy Monitoring - Follow Up Assessment    I verify that I have reviewed each item in the below checklist:  . Regimen for the patient is scheduled for the appropriate day and plan matches scheduled date. Marland Kitchen Appropriate non-routine labs are ordered dependent on drug ordered. . If applicable, additional medications reviewed and ordered per protocol based on lifetime cumulative doses and/or treatment regimen.   Plan for follow-up and/or issues identified: No . I-vent associated with next due treatment: No . MD and/or nursing notified: No  Emsley Custer, Jacqlyn Larsen 10/29/2019 9:12 AM

## 2019-10-29 NOTE — Telephone Encounter (Signed)
Scheduled appt per 4/15 los.  Spoke with pt and she is aware of her scheduled appt date and time.

## 2019-11-04 ENCOUNTER — Inpatient Hospital Stay: Payer: Medicare Other

## 2019-11-04 ENCOUNTER — Other Ambulatory Visit: Payer: Self-pay | Admitting: Hematology

## 2019-11-04 ENCOUNTER — Other Ambulatory Visit: Payer: Self-pay

## 2019-11-04 VITALS — BP 130/80 | HR 82 | Temp 97.8°F | Resp 16

## 2019-11-04 DIAGNOSIS — Z9221 Personal history of antineoplastic chemotherapy: Secondary | ICD-10-CM | POA: Diagnosis not present

## 2019-11-04 DIAGNOSIS — E785 Hyperlipidemia, unspecified: Secondary | ICD-10-CM | POA: Diagnosis not present

## 2019-11-04 DIAGNOSIS — Z9481 Bone marrow transplant status: Secondary | ICD-10-CM | POA: Diagnosis not present

## 2019-11-04 DIAGNOSIS — Z923 Personal history of irradiation: Secondary | ICD-10-CM | POA: Diagnosis not present

## 2019-11-04 DIAGNOSIS — G620 Drug-induced polyneuropathy: Secondary | ICD-10-CM | POA: Diagnosis not present

## 2019-11-04 DIAGNOSIS — C9002 Multiple myeloma in relapse: Secondary | ICD-10-CM | POA: Diagnosis not present

## 2019-11-04 DIAGNOSIS — C9000 Multiple myeloma not having achieved remission: Secondary | ICD-10-CM

## 2019-11-04 DIAGNOSIS — D61818 Other pancytopenia: Secondary | ICD-10-CM | POA: Diagnosis not present

## 2019-11-04 DIAGNOSIS — Z7952 Long term (current) use of systemic steroids: Secondary | ICD-10-CM | POA: Diagnosis not present

## 2019-11-04 DIAGNOSIS — Z7982 Long term (current) use of aspirin: Secondary | ICD-10-CM | POA: Diagnosis not present

## 2019-11-04 DIAGNOSIS — Z95828 Presence of other vascular implants and grafts: Secondary | ICD-10-CM

## 2019-11-04 DIAGNOSIS — I2510 Atherosclerotic heart disease of native coronary artery without angina pectoris: Secondary | ICD-10-CM | POA: Diagnosis not present

## 2019-11-04 DIAGNOSIS — Z5112 Encounter for antineoplastic immunotherapy: Secondary | ICD-10-CM | POA: Diagnosis not present

## 2019-11-04 DIAGNOSIS — I252 Old myocardial infarction: Secondary | ICD-10-CM | POA: Diagnosis not present

## 2019-11-04 DIAGNOSIS — Z79899 Other long term (current) drug therapy: Secondary | ICD-10-CM | POA: Diagnosis not present

## 2019-11-04 LAB — CBC WITH DIFFERENTIAL (CANCER CENTER ONLY)
Abs Immature Granulocytes: 0.06 10*3/uL (ref 0.00–0.07)
Basophils Absolute: 0 10*3/uL (ref 0.0–0.1)
Basophils Relative: 1 %
Eosinophils Absolute: 0.1 10*3/uL (ref 0.0–0.5)
Eosinophils Relative: 2 %
HCT: 28.7 % — ABNORMAL LOW (ref 36.0–46.0)
Hemoglobin: 9.4 g/dL — ABNORMAL LOW (ref 12.0–15.0)
Immature Granulocytes: 2 %
Lymphocytes Relative: 3 %
Lymphs Abs: 0.1 10*3/uL — ABNORMAL LOW (ref 0.7–4.0)
MCH: 37.9 pg — ABNORMAL HIGH (ref 26.0–34.0)
MCHC: 32.8 g/dL (ref 30.0–36.0)
MCV: 115.7 fL — ABNORMAL HIGH (ref 80.0–100.0)
Monocytes Absolute: 0.4 10*3/uL (ref 0.1–1.0)
Monocytes Relative: 11 %
Neutro Abs: 2.9 10*3/uL (ref 1.7–7.7)
Neutrophils Relative %: 81 %
Platelet Count: 82 10*3/uL — ABNORMAL LOW (ref 150–400)
RBC: 2.48 MIL/uL — ABNORMAL LOW (ref 3.87–5.11)
RDW: 14.8 % (ref 11.5–15.5)
WBC Count: 3.5 10*3/uL — ABNORMAL LOW (ref 4.0–10.5)
nRBC: 0 % (ref 0.0–0.2)

## 2019-11-04 LAB — CMP (CANCER CENTER ONLY)
ALT: 10 U/L (ref 0–44)
AST: 14 U/L — ABNORMAL LOW (ref 15–41)
Albumin: 3.6 g/dL (ref 3.5–5.0)
Alkaline Phosphatase: 53 U/L (ref 38–126)
Anion gap: 11 (ref 5–15)
BUN: 18 mg/dL (ref 8–23)
CO2: 21 mmol/L — ABNORMAL LOW (ref 22–32)
Calcium: 8.7 mg/dL — ABNORMAL LOW (ref 8.9–10.3)
Chloride: 108 mmol/L (ref 98–111)
Creatinine: 0.73 mg/dL (ref 0.44–1.00)
GFR, Est AFR Am: >60 mL/min (ref >60)
GFR, Estimated: >60 mL/min (ref >60)
Glucose, Bld: 103 mg/dL — ABNORMAL HIGH (ref 70–99)
Potassium: 3.8 mmol/L (ref 3.5–5.1)
Sodium: 140 mmol/L (ref 135–145)
Total Bilirubin: 0.7 mg/dL (ref 0.3–1.2)
Total Protein: 5.8 g/dL — ABNORMAL LOW (ref 6.5–8.1)

## 2019-11-04 MED ORDER — ZOLEDRONIC ACID 4 MG/100ML IV SOLN: 4.0000 mg | Freq: Once | INTRAVENOUS | Status: DC

## 2019-11-04 MED ORDER — PALONOSETRON HCL INJECTION 0.25 MG/5ML
INTRAVENOUS | Status: AC
  Filled 2019-11-04: qty 5

## 2019-11-04 MED ORDER — ZOLEDRONIC ACID 4 MG/5ML IV CONC
3.5000 mg | Freq: Once | INTRAVENOUS | Status: AC
  Administered 2019-11-04: 3.5 mg via INTRAVENOUS
  Filled 2019-11-04: qty 4.38

## 2019-11-04 MED ORDER — DEXTROSE 5 % IV SOLN
69.0000 mg/m2 | Freq: Once | INTRAVENOUS | Status: AC
  Administered 2019-11-04: 110 mg via INTRAVENOUS
  Filled 2019-11-04: qty 10

## 2019-11-04 MED ORDER — ZOLEDRONIC ACID 4 MG/100ML IV SOLN
INTRAVENOUS | Status: AC
  Filled 2019-11-04: qty 100

## 2019-11-04 MED ORDER — DEXAMETHASONE 4 MG PO TABS
20.0000 mg | ORAL_TABLET | Freq: Once | ORAL | Status: AC
  Administered 2019-11-04: 20 mg via ORAL

## 2019-11-04 MED ORDER — DEXAMETHASONE 4 MG PO TABS
ORAL_TABLET | ORAL | Status: AC
  Filled 2019-11-04: qty 5

## 2019-11-04 MED ORDER — PALONOSETRON HCL INJECTION 0.25 MG/5ML
0.2500 mg | Freq: Once | INTRAVENOUS | Status: AC
  Administered 2019-11-04: 0.25 mg via INTRAVENOUS

## 2019-11-04 MED ORDER — SODIUM CHLORIDE 0.9 % IV SOLN
Freq: Once | INTRAVENOUS | Status: AC
  Filled 2019-11-04: qty 250

## 2019-11-04 NOTE — Progress Notes (Signed)
Per Dr Burr Medico, ok to treat with platelets of 82 today.

## 2019-11-04 NOTE — Patient Instructions (Signed)
[facility] Discharge Instructions for Patients Receiving Chemotherapy  Today you received the following chemotherapy agents: carfilzomib.  To help prevent nausea and vomiting after your treatment, we encourage you to take your nausea medication as directed.   If you develop nausea and vomiting that is not controlled by your nausea medication, call the clinic.   BELOW ARE SYMPTOMS THAT SHOULD BE REPORTED IMMEDIATELY:  *FEVER GREATER THAN 100.5 F  *CHILLS WITH OR WITHOUT FEVER  NAUSEA AND VOMITING THAT IS NOT CONTROLLED WITH YOUR NAUSEA MEDICATION  *UNUSUAL SHORTNESS OF BREATH  *UNUSUAL BRUISING OR BLEEDING  TENDERNESS IN MOUTH AND THROAT WITH OR WITHOUT PRESENCE OF ULCERS  *URINARY PROBLEMS  *BOWEL PROBLEMS  UNUSUAL RASH Items with * indicate a potential emergency and should be followed up as soon as possible.  Feel free to call the clinic should you have any questions or concerns. The clinic phone number is [phone].  Please show the CHEMO ALERT CARD at check-in to the Emergency Department and triage nurse.   

## 2019-11-06 ENCOUNTER — Other Ambulatory Visit: Payer: Self-pay | Admitting: Cardiology

## 2019-11-08 ENCOUNTER — Other Ambulatory Visit: Payer: Self-pay

## 2019-11-08 ENCOUNTER — Ambulatory Visit (HOSPITAL_COMMUNITY): Payer: Medicare Other | Attending: Physician Assistant

## 2019-11-08 DIAGNOSIS — I5042 Chronic combined systolic (congestive) and diastolic (congestive) heart failure: Secondary | ICD-10-CM

## 2019-11-08 DIAGNOSIS — Z79899 Other long term (current) drug therapy: Secondary | ICD-10-CM | POA: Insufficient documentation

## 2019-11-08 DIAGNOSIS — Z7969 Long term (current) use of other immunomodulators and immunosuppressants: Secondary | ICD-10-CM

## 2019-11-08 LAB — MULTIPLE MYELOMA PANEL, SERUM
Albumin SerPl Elph-Mcnc: 3.5 g/dL (ref 2.9–4.4)
Albumin/Glob SerPl: 2 — ABNORMAL HIGH (ref 0.7–1.7)
Alpha 1: 0.2 g/dL (ref 0.0–0.4)
Alpha2 Glob SerPl Elph-Mcnc: 0.5 g/dL (ref 0.4–1.0)
B-Globulin SerPl Elph-Mcnc: 0.7 g/dL (ref 0.7–1.3)
Gamma Glob SerPl Elph-Mcnc: 0.2 g/dL — ABNORMAL LOW (ref 0.4–1.8)
Globulin, Total: 1.8 g/dL — ABNORMAL LOW (ref 2.2–3.9)
IgA: <5 mg/dL — ABNORMAL LOW (ref 64–422)
IgG (Immunoglobin G), Serum: 314 mg/dL — ABNORMAL LOW (ref 586–1602)
IgM (Immunoglobulin M), Srm: <5 mg/dL — ABNORMAL LOW (ref 26–217)
M Protein SerPl Elph-Mcnc: 0.1 g/dL — ABNORMAL HIGH
Total Protein ELP: 5.3 g/dL — ABNORMAL LOW (ref 6.0–8.5)

## 2019-11-12 NOTE — Progress Notes (Signed)
Pharmacist Chemotherapy Monitoring - Follow Up Assessment    I verify that I have reviewed each item in the below checklist:  . Regimen for the patient is scheduled for the appropriate day and plan matches scheduled date. Marland Kitchen Appropriate non-routine labs are ordered dependent on drug ordered. . If applicable, additional medications reviewed and ordered per protocol based on lifetime cumulative doses and/or treatment regimen.   Plan for follow-up and/or issues identified: No . I-vent associated with next due treatment: No . MD and/or nursing notified: No  Shannon Rush [facility] 11/12/2019 2:55 PM

## 2019-11-17 NOTE — Progress Notes (Signed)
[name]   [phone] [phone]   Clinic Follow up Note   Patient Care Team: [name], MD as PCP - General (Family Medicine) [name], MD as PCP - Cardiology (Cardiology) [name], MD as Attending Physician (Obstetrics and Gynecology) [name], MD as Consulting Physician (Internal Medicine) [name], MD as Consulting Physician (Hematology) [name], MD as Consulting Physician (Ophthalmology) [name], MD as Consulting Physician (Cardiology) [name], MD as Consulting Physician (Dermatology) [name] (Dentistry) [name], DPM as Consulting Physician (Podiatry) Lazaro Arms, RN as [place] [facility] Lazaro Arms, RN as Case Manager  Date of Service:  11/18/2019  CHIEF COMPLAINT: F/u of MM  SUMMARY OF ONCOLOGIC HISTORY: Oncology History Overview Note  Multiple myeloma [facility])   Staging form: Multiple Myeloma, AJCC 6th Edition   - Clinical: Stage IIIA - Signed by [name], MD on 09/04/2013    Multiple myeloma ([place])  05/26/2012 Initial Diagnosis   Presenting IgG was 4,040 mg/dL on 06/05/2012 (IgA 35; IgM 34); kappa free light chain 34.4 mg/dL, lambda 0.00, kappa:lambda ratio of 34.75; SPEP with M-spike of 2.60.   06/30/2012 Imaging   Numerous lytic lesions throughout the calvarium.  Lytic lesion within the left lateral scapula.  Findings most compatible with metastases or myeloma. Multiple mid and lower thoracic compression fractures.  Slight compression through the endplates at L3.   96/22/2979 Bone Marrow Biopsy   Bone marrow biopsy showed 49% plasma cell. Normal classical cytogenetics; however, myeloma FISH panel showed 13q- (intermediate risk)       07/14/2012 - 07/27/2012 Radiation Therapy   Palliative radiation 20 Gy over 10 fractions between to thoracic spine cord compression and symptomatic left scapula lytic lesion.   08/10/2012  - 11/2012 Chemotherapy   Started SQ Velcade once weekly, 3 weeks on, 1 weeks off; daily Revlimid d1-21, 7 days off; and Dexamethasone '40mg'$  PO weekly.    02/12/2013 Bone Marrow Transplant   Auto bone marrow transplant at [facility]     06/14/2013 Tumor Marker   IgG  1830,  Kappa:lamba ratio, 1.69 (baseline was 34.75 or   M-spike 0.28 (baseline of 2.6 or  89.3% of baseline).     06/25/2013 -  Chemotherapy   Started zometa 3.5 mg monthly    09/01/2013 - 01/2014 Chemotherapy   Maintenance therapy with revlimid '5mg'$  daily for 14 days and then off for 7 (decreased from 21 days on and 7 days off based on neutropenia). Stopped due to disease progression 01/2017   09/13/2013 - 09/17/2014 Hospital Admission   Hospital admission for pneumonia   12/20/2013 Treatment Plan Change   Maintenance therapy decreased to 2.5 mg daily for 21 days and then off for 7 days based on low counts.    01/25/2014 Tumor Marker   IgG 1360 M spike 0.5   03/01/2014 - 03/05/2014 Hospital Admission   [facility] center with an inferior STEMI, received thrombolytic therapy, cath showed 60% prox RCA, mid-distal RCA 50%, Echo normal, no stents.   04/11/2014 - 08/07/2014 Chemotherapy   Continue Revlimid at 2.5 mg 3 weeks on 1 week off   08/08/2014 - 08/13/2014 Hospital Admission   Hospital admission for pneumonia. Her Revlimid was held.   08/29/2014 - 09/14/2014 Chemotherapy   Maintenance Revlimid restarted   09/14/2014 - 09/17/2014 Hospital Admission   Admitted for pneumonia after coming back from a trip in [name]. Revlimid was held again.   11/13/2014 - 12/25/2015 Chemotherapy  Maintenance Revlimid restarted, changed to 2.41m daily, 2 weks on, 1 week off from 12/15/2014, stopped due to disease progression    01/24/2016 Progression   Patient is M protein has gradually increased to 1.0g, repeated a bone marrow biopsy showed plasma cell 10-30%   02/27/2016 - 03/07/2016 Chemotherapy   Pomalidomide 4 mg daily on day 1-21,  dexamethasone 40 mg on day 1, 8 and 15, every 28 days, started on 02/27/2016, daratumumab weekly started on 03/07/2016, held after first dose, due to hospitalization and a severe pancytopenia, pomalidomide was stopped afterwards    03/10/2016 - 03/29/2016 Hospital Admission   Pt was admitted for sepsis from pneumonia, and severe pancytopenia. She required ICU stay for a few days due to hypotension, developed b/l pleural effusion required diuretics and b/l thoracentesis. She also required blood and plt transfusion, and prolonged neupogen injection for severe neutropenia. She was discharged home after 3 weeks hospital stay    04/26/2016 - 04/26/2019 Chemotherapy   Daratumumab restarted on 04/26/2016, Velcade 1.356mm2 and dexa 2079meekly start on 11/3, velcade held since 06/28/2016 due to CHF, dexa stopped on12/11/2017 due to high risk of fracture.  Dara reduced to every 4 weeks due to COVID-19 starting 10/23/18. Returned to every 3 weeks starting 12/17/18. Restarted every 2 weeks starting 02/22/19 Stopped on 04/26/19 due to disease progression.    08/26/2016 Echocardiogram   - Left ventricle: LVEF is approximately 35% with diffuse diffuse   hypokinesis with severe hypokinesis/ akinesis of the   inferior/inferoseptal walls. In comparison to echo images from   December 2017, there does not appear to be significant change The   cavity size was normal. Wall thickness was normal. Doppler   parameters are consistent with abnormal left ventricular   relaxation (grade 1 diastolic dysfunction). - Aortic valve: AV is thickened. 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 Present in echo from December 2017 There was   trivial regurgitation. - Right ventricle: Systolic function was mildly reduced. - Right atrium: The atrium was mildly dilated. - Pericardium, extracardiac: A trivial pericardial effusion was   identified.   12/03/2016 Echocardiogram    -Left Ventricle: Systolic function was   mildly to moderately reduced. The estimated ejection fraction was   in the range of 40% to 45%. Diffuse hypokinesis. Doppler   parameters are consistent with abnormal left ventricular   relaxation (grade 1 diastolic dysfunction). Doppler parameters   are consistent with indeterminate ventricular filling pressure. - Left atrium: The atrium was severely dilated. - Tricuspid valve: There was trivial regurgitation. - Pulmonary arteries: Systolic pressure was within the normal   range. PA peak pressure: 33 mm Hg (S). - Global longitudinal strain -10.5% (abnormal)   03/12/2017 Echocardiogram   ECHO 03/12/17 Study Conclusions - Left ventricle: The cavity size was normal. Wall thickness was   increased in a pattern of mild LVH. Systolic function was normal.   The estimated ejection fraction was in the range of 50% to 55%.   There is hypokinesis of the basalinferior myocardium. Doppler   parameters are consistent with abnormal left ventricular   relaxation (grade 1 diastolic dysfunction). - Aortic valve: There was trivial regurgitation. - Mitral valve: Calcified annulus. Mildly thickened leaflets .   There was trivial regurgitation. - Tricuspid valve: There was mild regurgitation. Impressions: - There has been mild improvement in EF since prior study.    12/12/2017 - 12/15/2017 Hospital Admission   Admission diagnosis: Pnuemonia and acute hypoxic  respiratory failure Additional comments: treated for pneumoania and acute hypoxic respiratory failure before she was discharged on 12/15/17 with oral anitbiotics. She did not require home oxygen.    12/30/2017 - 01/02/2018 Hospital Admission   Admission diagnosis: Pnuemonia  Additional comments: She was again hospitalized on 12/30/17 for pneumonia, ID work up was otherwise negative, she was discharged home with oral antibiotics.    04/01/2018 Imaging   04/01/2018 CXR IMPRESSION: Persisting airspace disease  superimposed on chronic changes, suggesting pneumonia given the history. Followup PA and lateral chest X-ray is recommended in 3-4 weeks following therapy to ensure resolution and exclude underlying malignancy.  Redemonstration of mid and lower thoracic compression fractures with associated thoracic kyphosis.   04/23/2018 Echocardiogram   04/23/2018 ECHO LV EF: 45% -   50%   01/07/2019 Imaging   MRI Brain 01/07/19 IMPRESSION: MRI HEAD IMPRESSION:   1. 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 Lesion closely approximates the left inner ear structures and involves the left Meckel's cave and left fifth cranial nerve, and most likely is the causative etiology for patient's underlying left ear and facial symptoms. Follow-up examination with postcontrast imaging suggested for complete evaluation. Inclusion of IAC sequences would likely be helpful for visualization. 2. Additional 2.7 cm lesion involving the right occipital calvarium with adjacent marrow signal abnormality, also likely related to multiple myeloma. Metastatic disease would be the primary differential consideration. 3. Underlying age-appropriate cerebral atrophy with mild chronic microvascular ischemic disease. No other acute intracranial abnormality.   MRA HEAD IMPRESSION:   1. Negative intracranial MRA with no large vessel occlusion, hemodynamically significant stenosis, or other acute vascular abnormality. 2. Petrous and cavernous left ICA is partially encased and surrounded by the left petrous apex lesion without significant irregularity or stenosis. 3. 2 mm right paraophthalmic aneurysm.   MRA NECK IMPRESSION:   Normal MRA of the neck with wide patency of the carotid and vertebral arteries bilaterally. No hemodynamically significant stenosis or other acute vascular abnormality  identified.    01/14/2019 - 01/27/2019 Radiation Therapy   By Dr. Tammi Klippel.    Diagnosis:   79 yo woman with skull base myeloma lesion causing left ear pain      Indication for treatment:  Palliative        Radiation treatment dates:   01/14/2019 - 01/27/2019   Site/dose:   Skull Base / 20 Gy in 10 fractions   Beams/energy:   3D, photons / 10X, 6X   04/01/2019 Pathology Results   Bone Marrow biopsy 04/01/19  DIAGNOSIS: BONE MARROW, ASPIRATE, CLOT, CORE: - Normocellular marrow with minimal involvement by plasma cell neoplasm - See comment PERIPHERAL BLOOD: - Macrocytic anemia and leukopenia -See complete blood count   04/09/2019 PET scan   PET 04/09/19  IMPRESSION: 1. Scattered lytic lesions of bone. A few of these demonstrate hypermetabolic activity, most notably the right occipital bone lesion; the lytic lesion in the approximately C7 spinous process; the lytic expansile lesion of the left fourth rib anteriorly; and a small lytic focus in the right femoral head. Other scattered lytic lesions have only low-grade activity. 2. Multiple fractures as detailed above. 3. Other imaging findings of potential clinical significance: Aortic Atherosclerosis (ICD10-I70.0). Coronary atherosclerosis.   05/10/2019 -  Chemotherapy   -CyBorD (cyclophosphamide (CYTOXAN) '500mg'$  ('300mg'$ /m2), Velcade 1.'3mg'$ /m2, and dexa '20mg'$ , weekly) starting 05/24/19.   -Due to neuropathy after 2 doses of Velcade, I  switched to weekly Kyprolis 3 weeks on/1 week off on 06/14/19.   -Chemo held for RT 07/05/19-07/22/19.   -Dexa stopped and Cytoxan dose reduced to 400 mg on day 1, 8 and 15 every 28 days, from cycle 3.     07/05/2019 - 07/20/2019 Radiation Therapy   By Dr. Tammi Klippel   Diagnosis:   79 yo woman with painful right occipital and left hip involvement by multiple myeloma      Indication for treatment:  Palliation        Radiation treatment dates:   07/05/19-07/20/19   Site/dose:    1.  The Right Occipital skull  lesion was treated to 20 Gy in 20 fractions of 2 Gy 2.  The Left Hip lesion was treated to 20 Gy in 20 fractions of 2 Gy   Beams/energy:      1.  The Right Occipital skull lesion was treated using a 3-field 3D set-up 2.  The Left Hip lesion was treated anterior and posterior fields.      CURRENT THERAPY:  -CyBorD (cyclophosphamide(CYTOXAN)'500mg'$  ('300mg'$ /m2),Velcade 1.'3mg'$ /m2, and dexa '20mg'$ , weekly) starting 05/24/19. -Due toneuropathyafter 2 doses of Velcade, I switched toweeklyKyprolis 3 weeks on/1 week off on 06/14/19.Reduced to 2 weeks on/1 week off starting 09/16/19 due to thrombocytopenia.   -Chemo heldfor RT 07/05/19-07/22/19. -Dexareduced to '20mg'$ andCytoxan dose reduced to 400 mg on day 1, 8 and 15 every 28 days, from cycle 3on 08/19/19, Cytoxan reduced to '300mg'$  on day 1, 8 every 21 days after 09/02/19.   -Will reduce regimen to every 2 weeks starting with C8 on 12/09/19 -Restart Zometa injections every6 weekson 06/21/19.  INTERVAL HISTORY:  Shannon Rush is here for a follow up and treatment. She presents to the clinic alone. She notes she has been tired and unmotivated for 4 days after infusion. She is still able to get up and do what she needs to do. She noes she has reduced riding her bike to half a mile. She is overall doing less. She notes her pain is managed well on Morphine.  She notes traveling around 01/20/20 and may be gone for 1 month but is willing to adjust.    REVIEW OF SYSTEMS:   Constitutional: Denies fevers, chills or abnormal weight loss (+) increased fatigue  Eyes: Denies blurriness of vision Ears, nose, mouth, throat, and face: Denies mucositis or sore throat Respiratory: Denies cough, dyspnea or wheezes Cardiovascular: Denies palpitation, chest discomfort or lower extremity swelling Gastrointestinal:  Denies nausea, heartburn or change in bowel habits Skin: Denies abnormal skin rashes Lymphatics: Denies new  lymphadenopathy or easy bruising Neurological:Denies numbness, tingling or new weaknesses Behavioral/Psych: Mood is stable, no new changes  All other systems were reviewed with the patient and are negative.  MEDICAL HISTORY:  Past Medical History:  Diagnosis Date  . CAD (coronary artery disease)    a. inf STEMI (in [place] >>> [facility]) >>> LHC (8/15- [place]):  pRCA 60%, mid to dist RCA 50% >>> med rx  . Cardiomyopathy ([place])   . Cataract   . Chronic combined systolic and diastolic CHF (congestive heart failure) ([place])    cancer medication related   . Cord compression ([place]) 07/13/12   MRI- diffuse myeloma involvement of T-L spine  . History of radiation therapy 07/13/12-07/27/12   spinal cord compression T3-T10,left scapula  . 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 Multiple myeloma ([place]) 07/01/2012  . MVP (mitral valve prolapse)    a. trivial by echo 06/2017.  Marland Kitchen  OSTEOPOROSIS 06/11/2010   Multiple compression fractures; and spontaneous fracture of sternum Qualifier: Diagnosis of  By: [name], [name]     . Thoracic kyphosis 07/13/12   per MRI scan  . Unspecified deficiency anemia     SURGICAL HISTORY: Past Surgical History:  Procedure Laterality Date  . APPENDECTOMY    . CATARACT EXTRACTION, BILATERAL    . CESAREAN SECTION     x2   . COLONOSCOPY  2007   neg with Dr. [name]  . ELBOW SURGERY    . HIP SURGERY  2009   left  . TUBAL LIGATION      I have reviewed the social history and family history with the patient and they are unchanged from previous note.  ALLERGIES:  is allergic to zithromax [azithromycin]; zosyn [piperacillin sod-tazobactam so]; and quinolones.  MEDICATIONS:  Current Outpatient Medications  Medication Sig Dispense Refill  . acetaminophen (TYLENOL) 500 MG tablet Take 500 mg by mouth every 6 (six) hours as needed for moderate pain or fever.    Marland Kitchen acyclovir (ZOVIRAX) 400 MG tablet Take 1 tablet (400 mg total) by mouth 2 (two) times daily.  180 tablet 3  . aspirin EC 81 MG tablet Take 1 tablet (81 mg total) by mouth daily. 90 tablet 3  . atorvastatin (LIPITOR) 20 MG tablet Take 1 tablet (20 mg total) by mouth daily. (Patient taking differently: Take 20 mg by mouth at bedtime. ) 10 tablet 0  . calcium-vitamin D (OSCAL WITH D) 500-200 MG-UNIT TABS tablet Take by mouth.    . cyclophosphamide (CYTOXAN) 50 MG capsule TAKE 8 CAPSULES BY MOUTH WITH BREAKFAST ONCE WEEKLY, 3 WEEKS ON AND 1WEEK OFF. TAKE WITH FOOD TO MINIMIZE GI UPSET. TAKE EARLY IN THE DAY & MAINTAIN HYDRATION 24 capsule 1  . dexamethasone (DECADRON) 4 MG tablet Take 5 tablets (20 mg total) by mouth once a week. Take on the day of chemo 20 tablet 3  . doxycycline (VIBRA-TABS) 100 MG tablet Take 100 mg by mouth as needed (treatment for pneumonia).     . metoprolol succinate (TOPROL-XL) 25 MG 24 hr tablet TAKE 1 TABLET BY MOUTH  DAILY 90 tablet 3  . morphine (MSIR) 15 MG tablet Take 0.5 tablets (7.5 mg total) by mouth every 4 (four) hours as needed for severe pain. 4 tablet 0  . ondansetron (ZOFRAN) 8 MG tablet Take 1 tablet (8 mg total) by mouth every 8 (eight) hours as needed for nausea or vomiting. 20 tablet 3  . prochlorperazine (COMPAZINE) 10 MG tablet Take 1 tablet (10 mg total) by mouth every 6 (six) hours as needed (Nausea or vomiting). 30 tablet 1   No current facility-administered medications for this visit.    PHYSICAL EXAMINATION: ECOG PERFORMANCE STATUS: 2 - Symptomatic, <50% confined to bed  Vitals with BMI 11/18/2019  Height '5\' 7"'$   Weight 114 lbs 3 oz  BMI 19.75  Systolic 883  Diastolic 67  Pulse 67    Due to COVID19 we will limit examination to appearance. Patient had no complaints.  GENERAL:alert, no distress and comfortable SKIN: skin color normal, no rashes or significant lesions EYES: normal, Conjunctiva are pink and non-injected, sclera clear  NEURO: alert & oriented x 3 with fluent speech   LABORATORY DATA:  I have reviewed the data as  listed CBC Latest Ref Rng & Units 11/18/2019 11/04/2019 10/28/2019  WBC 4.0 - 10.5 K/uL 2.6(L) 3.5(L) 2.8(L)  Hemoglobin 12.0 - 15.0 g/dL 10.2(L) 9.4(L) 9.7(L)  Hematocrit 36.0 - 46.0 %  31.2(L) 28.7(L) 30.6(L)  Platelets 150 - 400 K/uL 197 82(L) 176     CMP Latest Ref Rng & Units 11/04/2019 10/28/2019 10/14/2019  Glucose 70 - 99 mg/dL 103(H) 97 105(H)  BUN 8 - 23 mg/dL '18 15 15  '$ Creatinine 0.44 - 1.00 mg/dL 0.73 0.82 0.80  Sodium 135 - 145 mmol/L 140 144 140  Potassium 3.5 - 5.1 mmol/L 3.8 3.8 4.0  Chloride 98 - 111 mmol/L 108 108 105  CO2 22 - 32 mmol/L 21(L) 23 21(L)  Calcium 8.9 - 10.3 mg/dL 8.7(L) 9.2 9.0  Total Protein 6.5 - 8.1 g/dL 5.8(L) 5.9(L) 6.1(L)  Total Bilirubin 0.3 - 1.2 mg/dL 0.7 0.8 0.8  Alkaline Phos 38 - 126 U/L 53 56 62  AST 15 - 41 U/L 14(L) 15 16  ALT 0 - 44 U/L '10 9 10      '$ RADIOGRAPHIC STUDIES: I have personally reviewed the radiological images as listed and agreed with the findings in the report. No results found.   ASSESSMENT & PLAN:  Shannon Rush is a 79 y.o. female with     1. IgG kappa Multiple myeloma s/p Auto SCT, intermediate risk, relapsed in 7/2017and progressed again in Oct 2020 -Diagnosed in 05/2012. Treated with induction chemo, radiation and BM transplant, but unfortunately progressed while on maintenance Revlimid 01/2016. She is currently onDaratumumabinfusion monotherapy.  -PETimagesfrom 04/09/19 showedScattered lytic lesions of bone. A few of these demonstrate hypermetabolic activity, most notably the right occipital bone lesion, C7 spinous process, Left 4th rib, andright femoral head.  -Herbone marrow biopsy from 04/02/19 showsnormocellular marrow with minimal involvement by plasma cell neoplasm(2%). -Unfortunately she has progressedpreviouslywith increased M-protein and more symptomatic bone pain from MM, s/p palliative radiation to left hip and right scalp -Based on Dr. Noah Delaine recommendation I changedher treatment to  CyBorD (oral cyclophosphamide '500mg'$ , Velcade 1.'3mg'$ /m2, and dexa '20mg'$ , weekly) started on11/9/20.After 2 doses of Velcade, I switched toweeklyKyprolis3 weeks on/1 week offstarting 06/14/19 due to neuropathy.Chemo waspreviouslyheld forpalliative RT.Cytoxan has been dose reduced and held as needed due to thrombocytopenia, currently on '300mg'$  weekly. Kyprolis reduced to 2 weeks on/1 week off starting 09/16/19 due to thrombocytopenia. -Given frequent infusions I offered her PAC placement, she declined. -She has received both her [place] -She is more fatigued lately but mostly stable. Her M-protein has reduced to 0.1 in the past two months. Will consider de-escalating her chemo to every 2 weeks. Goal is to maintain M-protein at low level for longer before stopping Cytoxan. She understands and is agreeable. Plan to start with C8 on 12/09/19 -She plans to travel for about 1 month in early July. Will hold IV treatment during that time.  -Labs reviewed, and adequate to proceed with C7D1 Kyprolis along with Cytoxan and Dexa today.  -F/u in 3 weeks    2.Left hip pain and recent fracture 09/07/19 -she had left hip surgery with rod in 2009  -She developed new left hip pain on weightbearingIn Dec 2020, no painat resting -She received target RT 07/05/19-07/20/19. Now her pain is nearly resolved, minimalnow -She was seen to have left periprosthetic hip fracture when she went to ED on 09/07/19. She is able to bear some weight with pain 1/10. She takes Tylenol but has quick release Morphine half tablet for significant pain.  -She has walker which she only uses as needed.    3. Left petrous apex bone lesion with hearing lossandRight OccipitalskullLesion  -Her MRI brain from 01/07/19 showedabnormal osseous lesion measuring up to approximately 5.9 cm  centered at the left petrous apex. Thismost likely is the cause of her underlyingleft ear and balancesymptoms. -Given she is symptomatic,  sheunderwent target RTto skull case myeloma lesionwith Dr Tammi Klippel 7/1-7/15/20. This is negative on9/25/20PET scan -Her PET from 04/09/19 shows lesion with hypermetabolic activity in this area concerning for MM progression here.She was symptomatic. -She recently completedRT to right occipital lesion per Dr. Manning12/21/20-07/20/19.Tolerated well.She has moderate hair loss at RT site, her dizziness improved but muffled hearing is the same. -overall stablewith "tipsy" feeling occasionally.  4. History of multiple pneumonia andbronchitis -She has had multiple episodes of pneumonia, required hospitalization,and bronchitis, required antibiotics -She has recovered well, will continue monitoring. I encouraged her to do spirometry,and exercise. -Her vaccines are up-to-date.She has received COVID vaccines  5. Pancytopenia -She mainly has anemia and Thrombocytopenia concerningwhich occurred with recent(03/2019) disease progression. -Did give bloodtransfusionon 06/04/19 -Has recurred with oral Cytoxan. Stable anemia. Thrombocytopenia and neutropenia resolved again today  6. CHF, CAD, inferior STEMI in 02/2014, HLD -She is on ASA, BB, statin. She will continue to f/u with Dr. Meda Coffee -BPwell controlled and normal lately. -10/2020echo showed normal EF, will continue monitoring   7. Osteoporosis with multiple fractures  -Last DEXA in 2015 showed worsening osteoporosis. She declined repeat DEXA.  -She had a fall induced fracture in 01/2018. -She was on Zometa for 4 years, stopped after April 2018  -I restarted herZometaq6weeksto help strengthen her boneon12/7/20. Will monitor her dental health -She was seen to have left periprosthetic hip fracture when she went to ED on 09/07/19.   8. Peripheral neuropathy in feet, G2 -Secondary to previous chemo. -She previously declined Gabapentin -Worsenedsince she started Velcade. Now she is onKyprolis since 11/20/21which  cause less neuropathy.Stable.  9. Goal of care discussion  -she is full code now  10Steroid induced hyperglycemia -Secondary to steroids,her random blood glucose was 217on 06/07/19. Has much improved recently. -I encouraged her to drink plenty of water and reduce sugar and carbohydrate in diet.  -06/21/19 A1c at 6.3. -Dexamethasone has been reduced to 20 mg weekly,BG has been normal lately   Plan -Labs reviewed and adequate to proceed with C7D1 Kyprolis today -Lab and Kyprolis next week with oral dexa and cytoxan  -Lab,  f/uand chemo in 3 weeks, will change treatment to every 2 weeks after next visit   -nextZometa injection in early June    No problem-specific Assessment & Plan notes found for this encounter.   No orders of the defined types were placed in this encounter.  All questions were answered. The patient knows to call the clinic with any problems, questions or concerns. No barriers to learning was detected. The total time spent in the appointment was 30 minutes.     [name], MD 11/18/2019   I, Joslyn Devon, am acting as scribe for [name], MD.   I have reviewed the above documentation for accuracy and completeness, and I agree with the above.

## 2019-11-18 ENCOUNTER — Other Ambulatory Visit: Payer: Self-pay

## 2019-11-18 ENCOUNTER — Inpatient Hospital Stay: Payer: Medicare Other

## 2019-11-18 ENCOUNTER — Other Ambulatory Visit: Payer: Self-pay | Admitting: Hematology

## 2019-11-18 ENCOUNTER — Other Ambulatory Visit: Payer: Medicare Other

## 2019-11-18 ENCOUNTER — Encounter: Payer: Self-pay | Admitting: Hematology

## 2019-11-18 ENCOUNTER — Inpatient Hospital Stay: Payer: Medicare Other | Admitting: Hematology

## 2019-11-18 ENCOUNTER — Ambulatory Visit: Payer: Medicare Other | Admitting: Hematology

## 2019-11-18 ENCOUNTER — Inpatient Hospital Stay: Payer: Medicare Other | Attending: Medical

## 2019-11-18 ENCOUNTER — Ambulatory Visit: Payer: Medicare Other

## 2019-11-18 VITALS — BP 119/67 | HR 67 | Temp 97.6°F | Resp 17 | Ht 67.0 in | Wt 114.2 lb

## 2019-11-18 DIAGNOSIS — Z7982 Long term (current) use of aspirin: Secondary | ICD-10-CM | POA: Insufficient documentation

## 2019-11-18 DIAGNOSIS — D649 Anemia, unspecified: Secondary | ICD-10-CM | POA: Insufficient documentation

## 2019-11-18 DIAGNOSIS — Z5112 Encounter for antineoplastic immunotherapy: Secondary | ICD-10-CM | POA: Insufficient documentation

## 2019-11-18 DIAGNOSIS — Z7952 Long term (current) use of systemic steroids: Secondary | ICD-10-CM | POA: Insufficient documentation

## 2019-11-18 DIAGNOSIS — Z9481 Bone marrow transplant status: Secondary | ICD-10-CM | POA: Insufficient documentation

## 2019-11-18 DIAGNOSIS — I5042 Chronic combined systolic (congestive) and diastolic (congestive) heart failure: Secondary | ICD-10-CM | POA: Diagnosis not present

## 2019-11-18 DIAGNOSIS — G620 Drug-induced polyneuropathy: Secondary | ICD-10-CM | POA: Insufficient documentation

## 2019-11-18 DIAGNOSIS — I252 Old myocardial infarction: Secondary | ICD-10-CM | POA: Insufficient documentation

## 2019-11-18 DIAGNOSIS — I2510 Atherosclerotic heart disease of native coronary artery without angina pectoris: Secondary | ICD-10-CM | POA: Insufficient documentation

## 2019-11-18 DIAGNOSIS — Z923 Personal history of irradiation: Secondary | ICD-10-CM | POA: Diagnosis not present

## 2019-11-18 DIAGNOSIS — Z9221 Personal history of antineoplastic chemotherapy: Secondary | ICD-10-CM | POA: Diagnosis not present

## 2019-11-18 DIAGNOSIS — M818 Other osteoporosis without current pathological fracture: Secondary | ICD-10-CM

## 2019-11-18 DIAGNOSIS — Z79899 Other long term (current) drug therapy: Secondary | ICD-10-CM | POA: Diagnosis not present

## 2019-11-18 DIAGNOSIS — T380X5A Adverse effect of glucocorticoids and synthetic analogues, initial encounter: Secondary | ICD-10-CM | POA: Diagnosis not present

## 2019-11-18 DIAGNOSIS — C9002 Multiple myeloma in relapse: Secondary | ICD-10-CM | POA: Insufficient documentation

## 2019-11-18 DIAGNOSIS — R739 Hyperglycemia, unspecified: Secondary | ICD-10-CM | POA: Insufficient documentation

## 2019-11-18 DIAGNOSIS — C9000 Multiple myeloma not having achieved remission: Secondary | ICD-10-CM

## 2019-11-18 LAB — CBC WITH DIFFERENTIAL (CANCER CENTER ONLY)
Abs Immature Granulocytes: 0.01 10*3/uL (ref 0.00–0.07)
Basophils Absolute: 0 10*3/uL (ref 0.0–0.1)
Basophils Relative: 1 %
Eosinophils Absolute: 0.1 10*3/uL (ref 0.0–0.5)
Eosinophils Relative: 2 %
HCT: 31.2 % — ABNORMAL LOW (ref 36.0–46.0)
Hemoglobin: 10.2 g/dL — ABNORMAL LOW (ref 12.0–15.0)
Immature Granulocytes: 0 %
Lymphocytes Relative: 3 %
Lymphs Abs: 0.1 10*3/uL — ABNORMAL LOW (ref 0.7–4.0)
MCH: 38.3 pg — ABNORMAL HIGH (ref 26.0–34.0)
MCHC: 32.7 g/dL (ref 30.0–36.0)
MCV: 117.3 fL — ABNORMAL HIGH (ref 80.0–100.0)
Monocytes Absolute: 0.4 10*3/uL (ref 0.1–1.0)
Monocytes Relative: 15 %
Neutro Abs: 2.1 10*3/uL (ref 1.7–7.7)
Neutrophils Relative %: 79 %
Platelet Count: 197 10*3/uL (ref 150–400)
RBC: 2.66 MIL/uL — ABNORMAL LOW (ref 3.87–5.11)
RDW: 15.8 % — ABNORMAL HIGH (ref 11.5–15.5)
WBC Count: 2.6 10*3/uL — ABNORMAL LOW (ref 4.0–10.5)
nRBC: 0 % (ref 0.0–0.2)

## 2019-11-18 MED ORDER — DEXAMETHASONE 4 MG PO TABS
ORAL_TABLET | ORAL | Status: AC
  Filled 2019-11-18: qty 5

## 2019-11-18 MED ORDER — DEXTROSE 5 % IV SOLN
69.0000 mg/m2 | Freq: Once | INTRAVENOUS | Status: AC
  Administered 2019-11-18: 110 mg via INTRAVENOUS
  Filled 2019-11-18: qty 30

## 2019-11-18 MED ORDER — SODIUM CHLORIDE 0.9 % IV SOLN
Freq: Once | INTRAVENOUS | Status: AC
  Filled 2019-11-18: qty 250

## 2019-11-18 MED ORDER — DEXAMETHASONE 4 MG PO TABS
20.0000 mg | ORAL_TABLET | Freq: Once | ORAL | Status: AC
  Administered 2019-11-18: 20 mg via ORAL

## 2019-11-18 MED ORDER — PALONOSETRON HCL INJECTION 0.25 MG/5ML
INTRAVENOUS | Status: AC
  Filled 2019-11-18: qty 5

## 2019-11-18 MED ORDER — PALONOSETRON HCL INJECTION 0.25 MG/5ML
0.2500 mg | Freq: Once | INTRAVENOUS | Status: AC
  Administered 2019-11-18: 0.25 mg via INTRAVENOUS

## 2019-11-18 NOTE — Progress Notes (Signed)
I received a call from [facility] in the lab.  Shannon Rush CMP tube was hemolyzed.  I relayed this information to [facility] in infusion.

## 2019-11-18 NOTE — Progress Notes (Signed)
Per Dr Burr Medico, ok to proceed without CMP results.

## 2019-11-18 NOTE — Patient Instructions (Signed)
[facility] Discharge Instructions for Patients Receiving Chemotherapy  Today you received the following chemotherapy agents: carfilzomib.  To help prevent nausea and vomiting after your treatment, we encourage you to take your nausea medication as directed.   If you develop nausea and vomiting that is not controlled by your nausea medication, call the clinic.   BELOW ARE SYMPTOMS THAT SHOULD BE REPORTED IMMEDIATELY:  *FEVER GREATER THAN 100.5 F  *CHILLS WITH OR WITHOUT FEVER  NAUSEA AND VOMITING THAT IS NOT CONTROLLED WITH YOUR NAUSEA MEDICATION  *UNUSUAL SHORTNESS OF BREATH  *UNUSUAL BRUISING OR BLEEDING  TENDERNESS IN MOUTH AND THROAT WITH OR WITHOUT PRESENCE OF ULCERS  *URINARY PROBLEMS  *BOWEL PROBLEMS  UNUSUAL RASH Items with * indicate a potential emergency and should be followed up as soon as possible.  Feel free to call the clinic should you have any questions or concerns. The clinic phone number is [phone].  Please show the CHEMO ALERT CARD at check-in to the Emergency Department and triage nurse.   

## 2019-11-19 ENCOUNTER — Telehealth: Payer: Self-pay | Admitting: Hematology

## 2019-11-19 NOTE — Progress Notes (Signed)
Pharmacist Chemotherapy Monitoring - Follow Up Assessment    I verify that I have reviewed each item in the below checklist:  . Regimen for the patient is scheduled for the appropriate day and plan matches scheduled date. Marland Kitchen Appropriate non-routine labs are ordered dependent on drug ordered. . If applicable, additional medications reviewed and ordered per protocol based on lifetime cumulative doses and/or treatment regimen.   Plan for follow-up and/or issues identified: No . I-vent associated with next due treatment: Yes . MD and/or nursing notified: No    Kennith Center, Pharm.D., CPP 11/19/2019@3 :09 PM

## 2019-11-19 NOTE — Telephone Encounter (Signed)
Scheduled appt per 5/6 los.  Pt will get an updated appt calendar at their next appt.

## 2019-11-22 ENCOUNTER — Other Ambulatory Visit: Payer: Self-pay

## 2019-11-22 DIAGNOSIS — C9002 Multiple myeloma in relapse: Secondary | ICD-10-CM

## 2019-11-22 DIAGNOSIS — L84 Corns and callosities: Secondary | ICD-10-CM | POA: Diagnosis not present

## 2019-11-22 DIAGNOSIS — M2042 Other hammer toe(s) (acquired), left foot: Secondary | ICD-10-CM | POA: Diagnosis not present

## 2019-11-22 DIAGNOSIS — M21622 Bunionette of left foot: Secondary | ICD-10-CM | POA: Diagnosis not present

## 2019-11-22 DIAGNOSIS — M21612 Bunion of left foot: Secondary | ICD-10-CM | POA: Diagnosis not present

## 2019-11-22 DIAGNOSIS — M79672 Pain in left foot: Secondary | ICD-10-CM | POA: Diagnosis not present

## 2019-11-22 MED ORDER — CYCLOPHOSPHAMIDE 50 MG PO CAPS: ORAL_CAPSULE | ORAL | 1 refills | Status: DC

## 2019-11-23 ENCOUNTER — Other Ambulatory Visit: Payer: Self-pay | Admitting: Nurse Practitioner

## 2019-11-23 MED ORDER — MORPHINE SULFATE 15 MG PO TABS: 7.5000 mg | ORAL_TABLET | ORAL | 0 refills | Status: DC | PRN

## 2019-11-25 ENCOUNTER — Other Ambulatory Visit: Payer: Medicare Other

## 2019-11-25 ENCOUNTER — Inpatient Hospital Stay: Payer: Medicare Other

## 2019-11-25 ENCOUNTER — Ambulatory Visit: Payer: Medicare Other

## 2019-11-25 ENCOUNTER — Other Ambulatory Visit: Payer: Self-pay

## 2019-11-25 VITALS — BP 121/75 | HR 81 | Temp 98.0°F | Resp 17

## 2019-11-25 DIAGNOSIS — I2510 Atherosclerotic heart disease of native coronary artery without angina pectoris: Secondary | ICD-10-CM | POA: Diagnosis not present

## 2019-11-25 DIAGNOSIS — Z923 Personal history of irradiation: Secondary | ICD-10-CM | POA: Diagnosis not present

## 2019-11-25 DIAGNOSIS — Z7982 Long term (current) use of aspirin: Secondary | ICD-10-CM | POA: Diagnosis not present

## 2019-11-25 DIAGNOSIS — I5042 Chronic combined systolic (congestive) and diastolic (congestive) heart failure: Secondary | ICD-10-CM | POA: Diagnosis not present

## 2019-11-25 DIAGNOSIS — C9000 Multiple myeloma not having achieved remission: Secondary | ICD-10-CM

## 2019-11-25 DIAGNOSIS — Z79899 Other long term (current) drug therapy: Secondary | ICD-10-CM | POA: Diagnosis not present

## 2019-11-25 DIAGNOSIS — I252 Old myocardial infarction: Secondary | ICD-10-CM | POA: Diagnosis not present

## 2019-11-25 DIAGNOSIS — C9002 Multiple myeloma in relapse: Secondary | ICD-10-CM | POA: Diagnosis not present

## 2019-11-25 DIAGNOSIS — Z9221 Personal history of antineoplastic chemotherapy: Secondary | ICD-10-CM | POA: Diagnosis not present

## 2019-11-25 DIAGNOSIS — Z7952 Long term (current) use of systemic steroids: Secondary | ICD-10-CM | POA: Diagnosis not present

## 2019-11-25 DIAGNOSIS — D649 Anemia, unspecified: Secondary | ICD-10-CM | POA: Diagnosis not present

## 2019-11-25 DIAGNOSIS — T380X5A Adverse effect of glucocorticoids and synthetic analogues, initial encounter: Secondary | ICD-10-CM | POA: Diagnosis not present

## 2019-11-25 DIAGNOSIS — R739 Hyperglycemia, unspecified: Secondary | ICD-10-CM | POA: Diagnosis not present

## 2019-11-25 DIAGNOSIS — Z9481 Bone marrow transplant status: Secondary | ICD-10-CM | POA: Diagnosis not present

## 2019-11-25 DIAGNOSIS — Z5112 Encounter for antineoplastic immunotherapy: Secondary | ICD-10-CM | POA: Diagnosis not present

## 2019-11-25 DIAGNOSIS — G620 Drug-induced polyneuropathy: Secondary | ICD-10-CM | POA: Diagnosis not present

## 2019-11-25 LAB — CMP (CANCER CENTER ONLY)
ALT: 14 U/L (ref 0–44)
AST: 18 U/L (ref 15–41)
Albumin: 3.9 g/dL (ref 3.5–5.0)
Alkaline Phosphatase: 58 U/L (ref 38–126)
Anion gap: 11 (ref 5–15)
BUN: 14 mg/dL (ref 8–23)
CO2: 24 mmol/L (ref 22–32)
Calcium: 9.1 mg/dL (ref 8.9–10.3)
Chloride: 105 mmol/L (ref 98–111)
Creatinine: 0.79 mg/dL (ref 0.44–1.00)
GFR, Est AFR Am: >60 mL/min (ref >60)
GFR, Estimated: >60 mL/min (ref >60)
Glucose, Bld: 78 mg/dL (ref 70–99)
Potassium: 4.2 mmol/L (ref 3.5–5.1)
Sodium: 140 mmol/L (ref 135–145)
Total Bilirubin: 0.8 mg/dL (ref 0.3–1.2)
Total Protein: 6.4 g/dL — ABNORMAL LOW (ref 6.5–8.1)

## 2019-11-25 LAB — CBC WITH DIFFERENTIAL (CANCER CENTER ONLY)
Abs Immature Granulocytes: 0.05 10*3/uL (ref 0.00–0.07)
Basophils Absolute: 0 10*3/uL (ref 0.0–0.1)
Basophils Relative: 1 %
Eosinophils Absolute: 0 10*3/uL (ref 0.0–0.5)
Eosinophils Relative: 1 %
HCT: 31.4 % — ABNORMAL LOW (ref 36.0–46.0)
Hemoglobin: 10.2 g/dL — ABNORMAL LOW (ref 12.0–15.0)
Immature Granulocytes: 2 %
Lymphocytes Relative: 2 %
Lymphs Abs: 0.1 10*3/uL — ABNORMAL LOW (ref 0.7–4.0)
MCH: 37.9 pg — ABNORMAL HIGH (ref 26.0–34.0)
MCHC: 32.5 g/dL (ref 30.0–36.0)
MCV: 116.7 fL — ABNORMAL HIGH (ref 80.0–100.0)
Monocytes Absolute: 0.4 10*3/uL (ref 0.1–1.0)
Monocytes Relative: 12 %
Neutro Abs: 2.7 10*3/uL (ref 1.7–7.7)
Neutrophils Relative %: 82 %
Platelet Count: 91 10*3/uL — ABNORMAL LOW (ref 150–400)
RBC: 2.69 MIL/uL — ABNORMAL LOW (ref 3.87–5.11)
RDW: 15.4 % (ref 11.5–15.5)
WBC Count: 3.2 10*3/uL — ABNORMAL LOW (ref 4.0–10.5)
nRBC: 0 % (ref 0.0–0.2)

## 2019-11-25 LAB — CK TOTAL AND CKMB (NOT AT ARMC)
CK, MB: 4.2 ng/mL (ref 0.5–5.0)
Relative Index: INVALID (ref 0.0–2.5)
Total CK: 52 U/L (ref 38–234)

## 2019-11-25 MED ORDER — PALONOSETRON HCL INJECTION 0.25 MG/5ML
0.2500 mg | Freq: Once | INTRAVENOUS | Status: AC
  Administered 2019-11-25: 0.25 mg via INTRAVENOUS

## 2019-11-25 MED ORDER — SODIUM CHLORIDE 0.9 % IV SOLN
Freq: Once | INTRAVENOUS | Status: DC
  Filled 2019-11-25: qty 250

## 2019-11-25 MED ORDER — DEXTROSE 5 % IV SOLN
69.0000 mg/m2 | Freq: Once | INTRAVENOUS | Status: AC
  Administered 2019-11-25: 110 mg via INTRAVENOUS
  Filled 2019-11-25: qty 30

## 2019-11-25 MED ORDER — DEXAMETHASONE 4 MG PO TABS
20.0000 mg | ORAL_TABLET | Freq: Once | ORAL | Status: AC
  Administered 2019-11-25: 20 mg via ORAL

## 2019-11-25 MED ORDER — DEXAMETHASONE 4 MG PO TABS
ORAL_TABLET | ORAL | Status: AC
  Filled 2019-11-25: qty 5

## 2019-11-25 MED ORDER — PALONOSETRON HCL INJECTION 0.25 MG/5ML
INTRAVENOUS | Status: AC
  Filled 2019-11-25: qty 5

## 2019-11-25 MED ORDER — SODIUM CHLORIDE 0.9 % IV SOLN
Freq: Once | INTRAVENOUS | Status: AC
  Filled 2019-11-25: qty 250

## 2019-11-25 NOTE — Patient Instructions (Signed)
[facility] Discharge Instructions for Patients Receiving Chemotherapy  Today you received the following chemotherapy agents: carfilzomib.  To help prevent nausea and vomiting after your treatment, we encourage you to take your nausea medication as directed.   If you develop nausea and vomiting that is not controlled by your nausea medication, call the clinic.   BELOW ARE SYMPTOMS THAT SHOULD BE REPORTED IMMEDIATELY:  *FEVER GREATER THAN 100.5 F  *CHILLS WITH OR WITHOUT FEVER  NAUSEA AND VOMITING THAT IS NOT CONTROLLED WITH YOUR NAUSEA MEDICATION  *UNUSUAL SHORTNESS OF BREATH  *UNUSUAL BRUISING OR BLEEDING  TENDERNESS IN MOUTH AND THROAT WITH OR WITHOUT PRESENCE OF ULCERS  *URINARY PROBLEMS  *BOWEL PROBLEMS  UNUSUAL RASH Items with * indicate a potential emergency and should be followed up as soon as possible.  Feel free to call the clinic should you have any questions or concerns. The clinic phone number is [phone].  Please show the CHEMO ALERT CARD at check-in to the Emergency Department and triage nurse.   

## 2019-11-25 NOTE — Progress Notes (Signed)
Platelets today are 37 - ok to treat per Dr. Burr Medico.

## 2019-11-26 LAB — KAPPA/LAMBDA LIGHT CHAINS
Kappa free light chain: 12.4 mg/L (ref 3.3–19.4)
Kappa, lambda light chain ratio: >8.27 — ABNORMAL HIGH (ref 0.26–1.65)
Lambda free light chains: <1.5 mg/L — ABNORMAL LOW (ref 5.7–26.3)

## 2019-12-03 DIAGNOSIS — M23307 Other meniscus derangements, unspecified meniscus, left knee: Secondary | ICD-10-CM | POA: Diagnosis not present

## 2019-12-03 NOTE — Progress Notes (Signed)
Pharmacist Chemotherapy Monitoring - Follow Up Assessment    I verify that I have reviewed each item in the below checklist:  . Regimen for the patient is scheduled for the appropriate day and plan matches scheduled date. Marland Kitchen Appropriate non-routine labs are ordered dependent on drug ordered. . If applicable, additional medications reviewed and ordered per protocol based on lifetime cumulative doses and/or treatment regimen.   Plan for follow-up and/or issues identified: No . I-vent associated with next due treatment: No . MD and/or nursing notified: No  Rush,Shannon K 12/03/2019 8:46 AM

## 2019-12-08 MED FILL — CYCLOPHOSPHAMIDE 50 MG CAPS: 50 | 1 days supply | Qty: 2 | Fill #1

## 2019-12-09 ENCOUNTER — Inpatient Hospital Stay: Payer: Medicare Other | Admitting: Nurse Practitioner

## 2019-12-09 ENCOUNTER — Inpatient Hospital Stay: Payer: Medicare Other

## 2019-12-09 ENCOUNTER — Other Ambulatory Visit: Payer: Self-pay

## 2019-12-09 VITALS — BP 103/77 | HR 66 | Temp 97.5°F | Resp 16 | Wt 112.2 lb

## 2019-12-09 DIAGNOSIS — Z9221 Personal history of antineoplastic chemotherapy: Secondary | ICD-10-CM | POA: Diagnosis not present

## 2019-12-09 DIAGNOSIS — Z9481 Bone marrow transplant status: Secondary | ICD-10-CM | POA: Diagnosis not present

## 2019-12-09 DIAGNOSIS — T380X5A Adverse effect of glucocorticoids and synthetic analogues, initial encounter: Secondary | ICD-10-CM | POA: Diagnosis not present

## 2019-12-09 DIAGNOSIS — Z7982 Long term (current) use of aspirin: Secondary | ICD-10-CM | POA: Diagnosis not present

## 2019-12-09 DIAGNOSIS — Z7952 Long term (current) use of systemic steroids: Secondary | ICD-10-CM | POA: Diagnosis not present

## 2019-12-09 DIAGNOSIS — I2510 Atherosclerotic heart disease of native coronary artery without angina pectoris: Secondary | ICD-10-CM | POA: Diagnosis not present

## 2019-12-09 DIAGNOSIS — I252 Old myocardial infarction: Secondary | ICD-10-CM | POA: Diagnosis not present

## 2019-12-09 DIAGNOSIS — G620 Drug-induced polyneuropathy: Secondary | ICD-10-CM | POA: Diagnosis not present

## 2019-12-09 DIAGNOSIS — Z5112 Encounter for antineoplastic immunotherapy: Secondary | ICD-10-CM | POA: Diagnosis not present

## 2019-12-09 DIAGNOSIS — C9002 Multiple myeloma in relapse: Secondary | ICD-10-CM

## 2019-12-09 DIAGNOSIS — C9000 Multiple myeloma not having achieved remission: Secondary | ICD-10-CM

## 2019-12-09 DIAGNOSIS — I5042 Chronic combined systolic (congestive) and diastolic (congestive) heart failure: Secondary | ICD-10-CM | POA: Diagnosis not present

## 2019-12-09 DIAGNOSIS — D649 Anemia, unspecified: Secondary | ICD-10-CM | POA: Diagnosis not present

## 2019-12-09 DIAGNOSIS — Z923 Personal history of irradiation: Secondary | ICD-10-CM | POA: Diagnosis not present

## 2019-12-09 DIAGNOSIS — R739 Hyperglycemia, unspecified: Secondary | ICD-10-CM | POA: Diagnosis not present

## 2019-12-09 DIAGNOSIS — Z79899 Other long term (current) drug therapy: Secondary | ICD-10-CM | POA: Diagnosis not present

## 2019-12-09 LAB — CBC WITH DIFFERENTIAL (CANCER CENTER ONLY)
Abs Immature Granulocytes: 0.02 10*3/uL (ref 0.00–0.07)
Basophils Absolute: 0 10*3/uL (ref 0.0–0.1)
Basophils Relative: 1 %
Eosinophils Absolute: 0.1 10*3/uL (ref 0.0–0.5)
Eosinophils Relative: 2 %
HCT: 30.5 % — ABNORMAL LOW (ref 36.0–46.0)
Hemoglobin: 9.7 g/dL — ABNORMAL LOW (ref 12.0–15.0)
Immature Granulocytes: 1 %
Lymphocytes Relative: 2 %
Lymphs Abs: 0.1 10*3/uL — ABNORMAL LOW (ref 0.7–4.0)
MCH: 37.3 pg — ABNORMAL HIGH (ref 26.0–34.0)
MCHC: 31.8 g/dL (ref 30.0–36.0)
MCV: 117.3 fL — ABNORMAL HIGH (ref 80.0–100.0)
Monocytes Absolute: 0.5 10*3/uL (ref 0.1–1.0)
Monocytes Relative: 15 %
Neutro Abs: 2.5 10*3/uL (ref 1.7–7.7)
Neutrophils Relative %: 79 %
Platelet Count: 183 10*3/uL (ref 150–400)
RBC: 2.6 MIL/uL — ABNORMAL LOW (ref 3.87–5.11)
RDW: 15.9 % — ABNORMAL HIGH (ref 11.5–15.5)
WBC Count: 3.2 10*3/uL — ABNORMAL LOW (ref 4.0–10.5)
nRBC: 0 % (ref 0.0–0.2)

## 2019-12-09 LAB — CMP (CANCER CENTER ONLY)
ALT: 14 U/L (ref 0–44)
AST: 21 U/L (ref 15–41)
Albumin: 4.2 g/dL (ref 3.5–5.0)
Alkaline Phosphatase: 48 U/L (ref 38–126)
Anion gap: 11 (ref 5–15)
BUN: 15 mg/dL (ref 8–23)
CO2: 23 mmol/L (ref 22–32)
Calcium: 9.1 mg/dL (ref 8.9–10.3)
Chloride: 106 mmol/L (ref 98–111)
Creatinine: 0.77 mg/dL (ref 0.44–1.00)
GFR, Est AFR Am: >60 mL/min (ref >60)
GFR, Estimated: >60 mL/min (ref >60)
Glucose, Bld: 90 mg/dL (ref 70–99)
Potassium: 3.9 mmol/L (ref 3.5–5.1)
Sodium: 140 mmol/L (ref 135–145)
Total Bilirubin: 1.2 mg/dL (ref 0.3–1.2)
Total Protein: 6.1 g/dL — ABNORMAL LOW (ref 6.5–8.1)

## 2019-12-09 MED ORDER — PALONOSETRON HCL INJECTION 0.25 MG/5ML
INTRAVENOUS | Status: AC
  Filled 2019-12-09: qty 5

## 2019-12-09 MED ORDER — DEXAMETHASONE 4 MG PO TABS
20.0000 mg | ORAL_TABLET | Freq: Once | ORAL | Status: AC
  Administered 2019-12-09: 20 mg via ORAL

## 2019-12-09 MED ORDER — DEXTROSE 5 % IV SOLN
69.0000 mg/m2 | Freq: Once | INTRAVENOUS | Status: AC
  Administered 2019-12-09: 110 mg via INTRAVENOUS
  Filled 2019-12-09: qty 55

## 2019-12-09 MED ORDER — SODIUM CHLORIDE 0.9 % IV SOLN
Freq: Once | INTRAVENOUS | Status: AC
  Filled 2019-12-09: qty 250

## 2019-12-09 MED ORDER — HEPARIN SOD (PORK) LOCK FLUSH 100 UNIT/ML IV SOLN
500.0000 [IU] | Freq: Once | INTRAVENOUS | Status: DC | PRN
  Filled 2019-12-09: qty 5

## 2019-12-09 MED ORDER — DEXAMETHASONE 4 MG PO TABS
ORAL_TABLET | ORAL | Status: AC
  Filled 2019-12-09: qty 5

## 2019-12-09 MED ORDER — SODIUM CHLORIDE 0.9% FLUSH
10.0000 mL | INTRAVENOUS | Status: DC | PRN
  Filled 2019-12-09: qty 10

## 2019-12-09 MED ORDER — PALONOSETRON HCL INJECTION 0.25 MG/5ML
0.2500 mg | Freq: Once | INTRAVENOUS | Status: AC
  Administered 2019-12-09: 0.25 mg via INTRAVENOUS

## 2019-12-09 NOTE — Patient Instructions (Signed)
[facility] Discharge Instructions for Patients Receiving Chemotherapy  Today you received the following chemotherapy agents: carfilzomib.  To help prevent nausea and vomiting after your treatment, we encourage you to take your nausea medication as directed.   If you develop nausea and vomiting that is not controlled by your nausea medication, call the clinic.   BELOW ARE SYMPTOMS THAT SHOULD BE REPORTED IMMEDIATELY:  *FEVER GREATER THAN 100.5 F  *CHILLS WITH OR WITHOUT FEVER  NAUSEA AND VOMITING THAT IS NOT CONTROLLED WITH YOUR NAUSEA MEDICATION  *UNUSUAL SHORTNESS OF BREATH  *UNUSUAL BRUISING OR BLEEDING  TENDERNESS IN MOUTH AND THROAT WITH OR WITHOUT PRESENCE OF ULCERS  *URINARY PROBLEMS  *BOWEL PROBLEMS  UNUSUAL RASH Items with * indicate a potential emergency and should be followed up as soon as possible.  Feel free to call the clinic should you have any questions or concerns. The clinic phone number is [phone].  Please show the CHEMO ALERT CARD at check-in to the Emergency Department and triage nurse.   

## 2019-12-09 NOTE — Progress Notes (Signed)
[name]   [phone] [phone]   Clinic Follow up Note   Patient Care Team: Shannon Resides, MD as PCP - General (Family Medicine) [name], MD as PCP - Cardiology (Cardiology) Shannon Gell, MD as Attending Physician (Obstetrics and Gynecology) [name], MD as Consulting Physician (Internal Medicine) [name], MD as Consulting Physician (Hematology) [name], MD as Consulting Physician (Ophthalmology) [name], MD as Consulting Physician (Cardiology) [name], MD as Consulting Physician (Dermatology) [name] (Dentistry) Shannon Rush, DPM as Consulting Physician (Podiatry) Lazaro Arms, RN as [facility] [employer] Lazaro Arms, RN as Case Manager Date of Service: 12/09/2019  CHIEF COMPLAINT: F/u Multiple Myeloma   SUMMARY OF ONCOLOGIC HISTORY: Oncology History Overview Note  Multiple myeloma [facility])   Staging form: Multiple Myeloma, AJCC 6th Edition   - Clinical: Stage IIIA - Signed by [name], MD on 09/04/2013    Multiple myeloma ([place])  05/26/2012 Initial Diagnosis   Presenting IgG was 4,040 mg/dL on 06/05/2012 (IgA 35; IgM 34); kappa free light chain 34.4 mg/dL, lambda 0.00, kappa:lambda ratio of 34.75; SPEP with M-spike of 2.60.   06/30/2012 Imaging   Numerous lytic lesions throughout the calvarium.  Lytic lesion within the left lateral scapula.  Findings most compatible with metastases or myeloma. Multiple mid and lower thoracic compression fractures.  Slight compression through the endplates at L3.   42/68/3419 Bone Marrow Biopsy   Bone marrow biopsy showed 49% plasma cell. Normal classical cytogenetics; however, myeloma FISH panel showed 13q- (intermediate risk)       07/14/2012 - 07/27/2012 Radiation Therapy   Palliative radiation 20 Gy over 10 fractions between to thoracic spine cord compression and symptomatic left scapula lytic lesion.     08/10/2012 - 11/2012 Chemotherapy   Started SQ Velcade once weekly, 3 weeks on, 1 weeks off; daily Revlimid d1-21, 7 days off; and Dexamethasone 41m PO weekly.    02/12/2013 Bone Marrow Transplant   [facility]    06/14/2013 Tumor Marker   IgG  1830,  Kappa:lamba ratio, 1.69 (baseline was 34.75 or   M-spike 0.28 (baseline of 2.6 or  89.3% of baseline).     06/25/2013 -  Chemotherapy   Started zometa 3.5 mg monthly    09/01/2013 - 01/2014 Chemotherapy   Maintenance therapy with revlimid 531mdaily for 14 days and then off for 7 (decreased from 21 days on and 7 days off based on neutropenia). Stopped due to disease progression 01/2017   09/13/2013 - 09/17/2014 Hospital Admission   Hospital admission for pneumonia   12/20/2013 Treatment Plan Change   Maintenance therapy decreased to 2.5 mg daily for 21 days and then off for 7 days based on low counts.    01/25/2014 Tumor Marker   IgG 1360 M spike 0.5   03/01/2014 - 03/05/2014 Hospital Admission   [facility] inferior STEM[facility], received thrombolytic therapy, cath showed 60% prox RCA, mid-distal RCA 50%, Echo normal, no stents.   04/11/2014 - 08/07/2014 Chemotherapy   Continue Revlimid at 2.5 mg 3 weeks on 1 week off   08/08/2014 - 08/13/2014 Hospital Admission   Hospital admission for pneumonia. Her Revlimid was held.   08/29/2014 - 09/14/2014 Chemotherapy   Maintenance Revlimid restarted   09/14/2014 - 09/17/2014 Hospital Admission   [place].   11/13/2014 - 12/25/2015 Chemotherapy  Maintenance Revlimid restarted, changed to 2.65m daily, 2 weks on, 1 week off from 12/15/2014, stopped due to disease progression    01/24/2016 Progression   Patient is M protein has gradually increased to 1.0g, repeated a bone marrow biopsy showed plasma cell 10-30%   02/27/2016 - 03/07/2016 Chemotherapy   Pomalidomide 4 mg daily on day  1-21, dexamethasone 40 mg on day 1, 8 and 15, every 28 days, started on 02/27/2016, daratumumab weekly started on 03/07/2016, held after first dose, due to hospitalization and a severe pancytopenia, pomalidomide was stopped afterwards    03/10/2016 - 03/29/2016 Hospital Admission   Pt was admitted for sepsis from pneumonia, and severe pancytopenia. She required ICU stay for a few days due to hypotension, developed b/l pleural effusion required diuretics and b/l thoracentesis. She also required blood and plt transfusion, and prolonged neupogen injection for severe neutropenia. She was discharged home after 3 weeks hospital stay    04/26/2016 - 04/26/2019 Chemotherapy   Daratumumab restarted on 04/26/2016, Velcade 1.369mm2 and dexa 2082meekly start on 11/3, velcade held since 06/28/2016 due to CHF, dexa stopped on12/11/2017 due to high risk of fracture.  Dara reduced to every 4 weeks due to COVID-19 starting 10/23/18. Returned to every 3 weeks starting 12/17/18. Restarted every 2 weeks starting 02/22/19 Stopped on 04/26/19 due to disease progression.    08/26/2016 Echocardiogram   - Left ventricle: LVEF is approximately 35% with diffuse diffuse   hypokinesis with severe hypokinesis/ akinesis of the   inferior/inferoseptal walls. In comparison to echo images from   December 2017, there does not appear to be significant change The   cavity size was normal. Wall thickness was normal. Doppler   parameters are consistent with abnormal left ventricular   relaxation (grade 1 diastolic dysfunction). - Aortic valve: AV is thickened. 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 Present in echo from December 2017 There was   trivial regurgitation. - Right ventricle: Systolic function was mildly reduced. - Right atrium: The atrium was mildly dilated. - Pericardium, extracardiac: A trivial pericardial effusion was   identified.   12/03/2016  Echocardiogram   -Left Ventricle: Systolic function was   mildly to moderately reduced. The estimated ejection fraction was   in the range of 40% to 45%. Diffuse hypokinesis. Doppler   parameters are consistent with abnormal left ventricular   relaxation (grade 1 diastolic dysfunction). Doppler parameters   are consistent with indeterminate ventricular filling pressure. - Left atrium: The atrium was severely dilated. - Tricuspid valve: There was trivial regurgitation. - Pulmonary arteries: Systolic pressure was within the normal   range. PA peak pressure: 33 mm Hg (S). - Global longitudinal strain -10.5% (abnormal)   03/12/2017 Echocardiogram   ECHO 03/12/17 Study Conclusions - Left ventricle: The cavity size was normal. Wall thickness was   increased in a pattern of mild LVH. Systolic function was normal.   The estimated ejection fraction was in the range of 50% to 55%.   There is hypokinesis of the basalinferior myocardium. Doppler   parameters are consistent with abnormal left ventricular   relaxation (grade 1 diastolic dysfunction). - Aortic valve: There was trivial regurgitation. - Mitral valve: Calcified annulus. Mildly thickened leaflets .   There was trivial regurgitation. - Tricuspid valve: There was mild regurgitation. Impressions: - There has been mild improvement in EF since prior study.    12/12/2017 - 12/15/2017 Hospital Admission   Admission diagnosis: Pnuemonia and acute hypoxic  respiratory failure Additional comments: treated for pneumoania and acute hypoxic respiratory failure before she was discharged on 12/15/17 with oral anitbiotics. She did not require home oxygen.    12/30/2017 - 01/02/2018 Hospital Admission   Admission diagnosis: Pnuemonia  Additional comments: She was again hospitalized on 12/30/17 for pneumonia, ID work up was otherwise negative, she was discharged home with oral antibiotics.    04/01/2018 Imaging   04/01/2018 CXR IMPRESSION: Persisting  airspace disease superimposed on chronic changes, suggesting pneumonia given the history. Followup PA and lateral chest X-ray is recommended in 3-4 weeks following therapy to ensure resolution and exclude underlying malignancy.  Redemonstration of mid and lower thoracic compression fractures with associated thoracic kyphosis.   04/23/2018 Echocardiogram   04/23/2018 ECHO LV EF: 45% -   50%   01/07/2019 Imaging   MRI Brain 01/07/19 IMPRESSION: MRI HEAD IMPRESSION:   1. 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 Lesion closely approximates the left inner ear structures and involves the left Meckel's cave and left fifth cranial nerve, and most likely is the causative etiology for patient's underlying left ear and facial symptoms. Follow-up examination with postcontrast imaging suggested for complete evaluation. Inclusion of IAC sequences would likely be helpful for visualization. 2. Additional 2.7 cm lesion involving the right occipital calvarium with adjacent marrow signal abnormality, also likely related to multiple myeloma. Metastatic disease would be the primary differential consideration. 3. Underlying age-appropriate cerebral atrophy with mild chronic microvascular ischemic disease. No other acute intracranial abnormality.   MRA HEAD IMPRESSION:   1. Negative intracranial MRA with no large vessel occlusion, hemodynamically significant stenosis, or other acute vascular abnormality. 2. Petrous and cavernous left ICA is partially encased and surrounded by the left petrous apex lesion without significant irregularity or stenosis. 3. 2 mm right paraophthalmic aneurysm.   MRA NECK IMPRESSION:   Normal MRA of the neck with wide patency of the carotid and vertebral arteries bilaterally. No hemodynamically significant stenosis or other acute  vascular abnormality identified.    01/14/2019 - 01/27/2019 Radiation Therapy   By Dr. Tammi Klippel.    Diagnosis:   79 yo woman with skull base myeloma lesion causing left ear pain      Indication for treatment:  Palliative        Radiation treatment dates:   01/14/2019 - 01/27/2019   Site/dose:   Skull Base / 20 Gy in 10 fractions   Beams/energy:   3D, photons / 10X, 6X   04/01/2019 Pathology Results   Bone Marrow biopsy 04/01/19  DIAGNOSIS: BONE MARROW, ASPIRATE, CLOT, CORE: - Normocellular marrow with minimal involvement by plasma cell neoplasm - See comment PERIPHERAL BLOOD: - Macrocytic anemia and leukopenia -See complete blood count   04/09/2019 PET scan   PET 04/09/19  IMPRESSION: 1. Scattered lytic lesions of bone. A few of these demonstrate hypermetabolic activity, most notably the right occipital bone lesion; the lytic lesion in the approximately C7 spinous process; the lytic expansile lesion of the left fourth rib anteriorly; and a small lytic focus in the right femoral head. Other scattered lytic lesions have only low-grade activity. 2. Multiple fractures as detailed above. 3. Other imaging findings of potential clinical significance: Aortic Atherosclerosis (ICD10-I70.0). Coronary atherosclerosis.   05/10/2019 -  Chemotherapy   -CyBorD (cyclophosphamide (CYTOXAN) 519m (3064mm2), Velcade 1.19m519m2, and dexa 23m45meekly) starting 05/24/19.   -Due to neuropathy after 2 doses of Velcade, I  switched to weekly Kyprolis 3 weeks on/1 week off on 06/14/19.   -Chemo held for RT 07/05/19-07/22/19.   -Dexa stopped and Cytoxan dose reduced to 400 mg on day 1, 8 and 15 every 28 days, from cycle 3.     07/05/2019 - 07/20/2019 Radiation Therapy   By Dr. Tammi Klippel   Diagnosis:   79 yo woman with painful right occipital and left hip involvement by multiple myeloma      Indication for treatment:  Palliation        Radiation treatment dates:   07/05/19-07/20/19   Site/dose:    1.  The  Right Occipital skull lesion was treated to 20 Gy in 20 fractions of 2 Gy 2.  The Left Hip lesion was treated to 20 Gy in 20 fractions of 2 Gy   Beams/energy:      1.  The Right Occipital skull lesion was treated using a 3-field 3D set-up 2.  The Left Hip lesion was treated anterior and posterior fields.     CURRENT THERAPY:  -CyBorD (cyclophosphamide(CYTOXAN)528m (3067mm2),Velcade 1.10m29m2, and dexa 27m80meekly) starting 05/24/19. -Due toneuropathyafter 2 doses of Velcade, I switched toweeklyKyprolis 3 weeks on/1 week off on 06/14/19.Reduced to 2 weeks on/1 week off starting 09/16/19 due to thrombocytopenia.  -Chemo heldfor RT 07/05/19-07/22/19. -Dexareduced to 27mg310mytoxan dose reduced to 400 mg on day 1, 8 and 15 every 28 days, from cycle 3on 08/19/19, Cytoxan reduced to 300mgo16my 1, 8 every 21 daysafter 09/02/19.              -Will reduce regimen to every 2 weeks starting with C8 on 12/09/19 -Restart Zometa injections every6 weekson 06/21/19.  INTERVAL HISTORY: Ms. Chancellor Arcans for f/u and treatment as scheduled. She completed kyprolix/dex and oral cytoxan on 5/13. She is having a good day. Her pain and energy level fluctuate, has "ups and downs but more downs than I would like." She remains active, rides bike, and does most activities as usual. Food doesn't appeal to her. Has occasional mild n/v/c that she can manage with supportive meds. Denies mucositis. Denies dysuria or hematuria. No recent fever, chills, cough, chest pain, dyspnea, edema, fall. Usual pains in knees, back, neck fluctuate. Denies new pain. Takes 1/4 tab morphine "some days an most nights" which helps her sleep and feel better next day. Neuropathy is stable, she has some vague tingling in left side/flank without pain. Her ear fullness is "definitively better" but still feels little tipsy. She is doing balance exercises.    MEDICAL HISTORY:  Past Medical History:    Diagnosis Date  . CAD (coronary artery disease)    [place] mid to dist RCA 50% >>> med rx  . Cardiomyopathy (HCC)  [place]   . Chronic combined systolic and diastolic CHF (congestive heart failure) (HCC)  [place]   . Cord compression (HCC) 1[place]/13   MRI- diffuse myeloma involvement of T-L spine  . History of radiation therapy 07/13/12-07/27/12   spinal cord compression T3-T10,left scapula  . 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 Multiple myeloma (HCC) 1[place]8/2013  . MVP (mitral valve prolapse)    a. trivial by echo 06/2017.  . OSTEOPOROSIS 06/11/2010   Multiple compression fractures; and spontaneous fracture of sternum Qualifier: Diagnosis of  By: Saxon Zebedee Ibausan [name] Thoracic kyphosis 07/13/12   per MRI scan  . Unspecified  deficiency anemia     SURGICAL HISTORY: Past Surgical History:  Procedure Laterality Date  . APPENDECTOMY    . CATARACT EXTRACTION, BILATERAL    . CESAREAN SECTION     x2   . COLONOSCOPY  2007   neg with Dr. [name]  . ELBOW SURGERY    . HIP SURGERY  2009   left  . TUBAL LIGATION      I have reviewed the social history and family history with the patient and they are unchanged from previous note.  ALLERGIES:  is allergic to zithromax [azithromycin]; zosyn [piperacillin sod-tazobactam so]; and quinolones.  MEDICATIONS:  Current Outpatient Medications  Medication Sig Dispense Refill  . acetaminophen (TYLENOL) 500 MG tablet Take 500 mg by mouth every 6 (six) hours as needed for moderate pain or fever.    Marland Kitchen acyclovir (ZOVIRAX) 400 MG tablet Take 1 tablet (400 mg total) by mouth 2 (two) times daily. 180 tablet 3  . aspirin EC 81 MG tablet Take 1 tablet (81 mg total) by mouth daily. 90 tablet 3  . atorvastatin (LIPITOR) 20 MG tablet Take 1 tablet (20 mg total) by mouth daily. (Patient taking differently: Take 20 mg by mouth at bedtime. ) 10 tablet  0  . calcium-vitamin D (OSCAL WITH D) 500-200 MG-UNIT TABS tablet Take by mouth.    . cyclophosphamide (CYTOXAN) 50 MG capsule TAKE 6 CAPSULES BY MOUTH WITH BREAKFAST ONCE WEEKLY, 3 WEEKS ON AND 1WEEK OFF. TAKE WITH FOOD TO MINIMIZE GI UPSET. TAKE EARLY IN THE DAY & MAINTAIN HYDRATION 18 capsule 1  . doxycycline (VIBRA-TABS) 100 MG tablet Take 100 mg by mouth as needed (treatment for pneumonia).     . metoprolol succinate (TOPROL-XL) 25 MG 24 hr tablet TAKE 1 TABLET BY MOUTH  DAILY 90 tablet 3  . morphine (MSIR) 15 MG tablet Take 0.5 tablets (7.5 mg total) by mouth every 4 (four) hours as needed for severe pain. 20 tablet 0  . prochlorperazine (COMPAZINE) 10 MG tablet Take 1 tablet (10 mg total) by mouth every 6 (six) hours as needed (Nausea or vomiting). 30 tablet 1  . ondansetron (ZOFRAN) 8 MG tablet Take 1 tablet (8 mg total) by mouth every 8 (eight) hours as needed for nausea or vomiting. 20 tablet 3   No current facility-administered medications for this visit.    PHYSICAL EXAMINATION: ECOG PERFORMANCE STATUS: 1 - Symptomatic but completely ambulatory  Vitals:   12/09/19 1142  BP: 103/77  Pulse: 66  Resp: 16  Temp: (!) 97.5 F (36.4 C)  SpO2: 100%   Filed Weights   12/09/19 1142  Weight: 112 lb 3.2 oz (50.9 kg)    GENERAL:alert, no distress and comfortable SKIN: no rash  EYES:  sclera clear LUNGS: clear with normal breathing effort HEART: regular rate & rhythm, no lower extremity edema Musculoskeletal: no focal spinal tenderness or CVAT NEURO: alert & oriented x 3 with fluent speech, steady gait   LABORATORY DATA:  I have reviewed the data as listed CBC Latest Ref Rng & Units 12/09/2019 11/25/2019 11/18/2019  WBC 4.0 - 10.5 K/uL 3.2(L) 3.2(L) 2.6(L)  Hemoglobin 12.0 - 15.0 g/dL 9.7(L) 10.2(L) 10.2(L)  Hematocrit 36.0 - 46.0 % 30.5(L) 31.4(L) 31.2(L)  Platelets 150 - 400 K/uL 183 91(L) 197     CMP Latest Ref Rng & Units 12/09/2019 11/25/2019 11/04/2019  Glucose 70 - 99  mg/dL 90 78 103(H)  BUN 8 - 23 mg/dL 15 14 18   Creatinine 0.44 - 1.00 mg/dL 0.77  0.79 0.73  Sodium 135 - 145 mmol/L 140 140 140  Potassium 3.5 - 5.1 mmol/L 3.9 4.2 3.8  Chloride 98 - 111 mmol/L 106 105 108  CO2 22 - 32 mmol/L 23 24 21(L)  Calcium 8.9 - 10.3 mg/dL 9.1 9.1 8.7(L)  Total Protein 6.5 - 8.1 g/dL 6.1(L) 6.4(L) 5.8(L)  Total Bilirubin 0.3 - 1.2 mg/dL 1.2 0.8 0.7  Alkaline Phos 38 - 126 U/L 48 58 53  AST 15 - 41 U/L 21 18 14(L)  ALT 0 - 44 U/L 14 14 10       RADIOGRAPHIC STUDIES: I have personally reviewed the radiological images as listed and agreed with the findings in the report. No results found.   ASSESSMENT & PLAN: GAVINA DILDINE a 17X.o.femalewith    1. IgG kappa Multiple myeloma s/p Auto SCT, intermediate risk, relapsed in 01/2016 -Diagnosed in 05/2012. Treated with induction chemo, radiation and BM transplant, but unfortunately progressed while on maintenance Revlimid 01/2016. She is currently onDaratumumabmonotherapy -Dexa was stoppeddue to her high risk of fracture, and well controlled MM -During COVID19 treatment changed toevery 4 weeks, and changed back to every 3 weeks in early June -Due toher slight disease progressionandsymptomatic bone lesion in left petrous apex, Dr. Burr Medico changed Dara back to q2 weeks. -Due to increase in M protein from 0.3 to 0.6, she underwent restaging bone marrow biopsy in 03/2019 which shows normocellular marrow and minimal involvement by plasma cell neoplasm (2%) -She also underwent PET scan in 03/2019 which shows scattered lytic lesions, some of which show hypermetabolic activity. -M spike up to 1.1 on 06/07/19 and she developed worsening cytopenias, consistent with progression -She started CyBorD on 05/24/19. She developed worsening neuropathy after first velcade injand it was changed to kyprolis after second dose.  -Her M spike decreased and sustained at 0.1, her chemo has been de-escalated to q2 weeks.  -Ms. Danzy  appears stable. She completed another cycle of Kyprolis, cytoxan, and dex. She tolerates treatment well with fluctuating fatigue and intermittent managed n/v/c. She is able to function well and remain active. Her pain is stable overall on low dose Morphine  -CBC and CMP adeuqate to proceed with kyprolis, cytoxan and dex today, continue q2 weeks. zometa due with chemo in 2 weeks.  -MM panel is pending from today, will f/u -f/u in 2 weeks with next cycle   2. Left petrous apex bone lesionand right occipital lesion -Her inner ear issues and dizziness progressed and she developedmildbalance issueand nausea  -Her MRI brain from 01/07/19 showedabnormal osseous lesion measuring up to approximately 5.9 cm centered at the left petrous apex. Thismost likely is the cause of her underlyingleft ear and balancesymptoms. -S/ptarget RT with Dr Tammi Klippel 7/1-7/15. -She has stable muffled hearing and mild imbalanceissue, no significant improvement after RT -S/p RT to right occipital lesion per Dr. Tammi Klippel starting 12/21 - 07/20/19 -Her ear fullness and altered balance has much improved, continue balance exercises   3. History of multiple pneumonia andbronchitis -She has had multiple episodes of pneumoniaand bronchitis, required hospitalizationandantibiotics -Her vaccines are up-to-date. -no clinical concern for recurrent infection   4. Pancytopenia -Secondary to MM and treatment  -Stable anemia. Thrombocytopenia and neutropenia resolved -monitoring   5. CHF, CAD, inferior STEMI in 02/2014, HLD -She is on ASA, beta blocker, statin -f/u with Dr. Meda Coffee and continue medications -normal EF and improved strain on echo from 04/27/19, per cardiology PA ok to do echo q6 months for now -CKMB in normal range, will monitor periodically on  Kyprolis  6. Osteoporosis with multiple fractures -Last DEXA in 2015 showed worsening osteoporosis. She declined repeat DEXA. -She had a fall induced  fracture in 01/2018. -She was on Zometa for 4 years, stopped after April 2018 -she developed left hip pain, Xray on 11/30 negative for new fracture, hardware is intact. This is likely related to MM -RestartedZometa on 06/21/19,dose-adjustedfor CrCl, continue q6 weeks -S/p palliative RT to left hip and right occipital skull per Dr. Tammi Klippel  From 07/05/19 - 07/20/19, left hip pain improved -Her knee, hip, neck pains are stable at baseline, well controlled on low dose morphine PRN, takes some days and most nights.  -Denies new pain   7. Peripheral neuropathy in feet, G1-2 -Secondary to previous chemo,tuning fork exam shows decreased vibratory sense up to her ankle.  -previouslydeclined gabapentin -Worsened on Velcade which was switched to Kyprolis after 2 doses -Stable on Kyprolis, able to function well  8. Goal of care discussion  -full code -treatment goal is palliative  PLAN: -Labs reviewed, MM Panel pending  -Proceed with kyprolis/dex and oral cytoxan today and continue q2 weeks -Refill cytoxan -F/u in 2 weeks with next cycle   All questions were answered. The patient knows to call the clinic with any problems, questions or concerns. No barriers to learning was detected.     Alla Feeling, NP 12/10/19

## 2019-12-10 ENCOUNTER — Encounter: Payer: Self-pay | Admitting: Nurse Practitioner

## 2019-12-10 ENCOUNTER — Telehealth: Payer: Self-pay | Admitting: Nurse Practitioner

## 2019-12-10 NOTE — Telephone Encounter (Signed)
Scheduled appt per 5/27 los. ° °Spoke with pt and they are aware of the appt date and time. °

## 2019-12-15 ENCOUNTER — Other Ambulatory Visit: Payer: Self-pay | Admitting: Hematology

## 2019-12-15 DIAGNOSIS — C9002 Multiple myeloma in relapse: Secondary | ICD-10-CM

## 2019-12-15 LAB — MULTIPLE MYELOMA PANEL, SERUM
Albumin SerPl Elph-Mcnc: 3.8 g/dL (ref 2.9–4.4)
Albumin/Glob SerPl: 1.9 — ABNORMAL HIGH (ref 0.7–1.7)
Alpha 1: 0.3 g/dL (ref 0.0–0.4)
Alpha2 Glob SerPl Elph-Mcnc: 0.6 g/dL (ref 0.4–1.0)
B-Globulin SerPl Elph-Mcnc: 0.9 g/dL (ref 0.7–1.3)
Gamma Glob SerPl Elph-Mcnc: 0.3 g/dL — ABNORMAL LOW (ref 0.4–1.8)
Globulin, Total: 2.1 g/dL — ABNORMAL LOW (ref 2.2–3.9)
IgA: <5 mg/dL — ABNORMAL LOW (ref 64–422)
IgG (Immunoglobin G), Serum: 314 mg/dL — ABNORMAL LOW (ref 586–1602)
IgM (Immunoglobulin M), Srm: <5 mg/dL — ABNORMAL LOW (ref 26–217)
M Protein SerPl Elph-Mcnc: 0.1 g/dL — ABNORMAL HIGH
Total Protein ELP: 5.9 g/dL — ABNORMAL LOW (ref 6.0–8.5)

## 2019-12-16 ENCOUNTER — Other Ambulatory Visit: Payer: Self-pay | Admitting: Hematology

## 2019-12-16 DIAGNOSIS — C9002 Multiple myeloma in relapse: Secondary | ICD-10-CM

## 2019-12-20 ENCOUNTER — Other Ambulatory Visit: Payer: Self-pay | Admitting: Pharmacist

## 2019-12-20 ENCOUNTER — Other Ambulatory Visit: Payer: Self-pay | Admitting: Hematology

## 2019-12-20 DIAGNOSIS — C9002 Multiple myeloma in relapse: Secondary | ICD-10-CM

## 2019-12-23 NOTE — Progress Notes (Signed)
[facility]   [phone] Fax:([phone]) [phone]   Clinic Follow up Note   Patient Care Team: [name], MD as PCP - General (Family Medicine) [name], MD as PCP - Cardiology (Cardiology) [name], MD as Attending Physician (Obstetrics and Gynecology) [name], MD as Consulting Physician (Internal Medicine) [name], MD as Consulting Physician (Hematology) [name], MD as Consulting Physician (Ophthalmology) [name], MD as Consulting Physician (Cardiology) [name], MD as Consulting Physician (Dermatology) [name] (Dentistry) [name], DPM as Consulting Physician (Podiatry) Lazaro Arms, RN as [facility] Lazaro Arms, RN as Case Manager  Date of Service:  12/24/2019  CHIEF COMPLAINT: F/u of MM  SUMMARY OF ONCOLOGIC HISTORY: Oncology History Overview Note  Multiple myeloma ([place])   Staging form: Multiple Myeloma, AJCC 6th Edition   - Clinical: Stage IIIA - Signed by [name], MD on 09/04/2013    Multiple myeloma ([place])  05/26/2012 Initial Diagnosis   Presenting IgG was 4,040 mg/dL on 06/05/2012 (IgA 35; IgM 34); kappa free light chain 34.4 mg/dL, lambda 0.00, kappa:lambda ratio of 34.75; SPEP with M-spike of 2.60.   06/30/2012 Imaging   Numerous lytic lesions throughout the calvarium.  Lytic lesion within the left lateral scapula.  Findings most compatible with metastases or myeloma. Multiple mid and lower thoracic compression fractures.  Slight compression through the endplates at L3.   67/89/3810 Bone Marrow Biopsy   Bone marrow biopsy showed 49% plasma cell. Normal classical cytogenetics; however, myeloma FISH panel showed 13q- (intermediate risk)       07/14/2012 - 07/27/2012 Radiation Therapy   Palliative radiation 20 Gy over 10 fractions between to thoracic spine cord compression and symptomatic left scapula lytic lesion.   08/10/2012  - 11/2012 Chemotherapy   Started SQ Velcade once weekly, 3 weeks on, 1 weeks off; daily Revlimid d1-21, 7 days off; and Dexamethasone '40mg'$  PO weekly.    02/12/2013 Bone Marrow Transplant   Auto bone marrow transplant at [facility]     06/14/2013 Tumor Marker   IgG  1830,  Kappa:lamba ratio, 1.69 (baseline was 34.75 or   M-spike 0.28 (baseline of 2.6 or  89.3% of baseline).     06/25/2013 -  Chemotherapy   Started zometa 3.5 mg monthly    09/01/2013 - 01/2014 Chemotherapy   Maintenance therapy with revlimid '5mg'$  daily for 14 days and then off for 7 (decreased from 21 days on and 7 days off based on neutropenia). Stopped due to disease progression 01/2017   09/13/2013 - 09/17/2014 Hospital Admission   Hospital admission for pneumonia   12/20/2013 Treatment Plan Change   Maintenance therapy decreased to 2.5 mg daily for 21 days and then off for 7 days based on low counts.    01/25/2014 Tumor Marker   IgG 1360 M spike 0.5   03/01/2014 - 03/05/2014 Hospital Admission   [facility] center with an inferior STEMI, received thrombolytic therapy, cath showed 60% prox RCA, mid-distal RCA 50%, Echo normal, no stents.   04/11/2014 - 08/07/2014 Chemotherapy   Continue Revlimid at 2.5 mg 3 weeks on 1 week off   08/08/2014 - 08/13/2014 Hospital Admission   Hospital admission for pneumonia. Her Revlimid was held.   08/29/2014 - 09/14/2014 Chemotherapy   Maintenance Revlimid restarted   09/14/2014 - 09/17/2014 Hospital Admission   Admitted for pneumonia after coming back from a trip in [name]. Revlimid was held again.   11/13/2014 - 12/25/2015 Chemotherapy  Maintenance Revlimid restarted, changed to 2.41m daily, 2 weks on, 1 week off from 12/15/2014, stopped due to disease progression    01/24/2016 Progression   Patient is M protein has gradually increased to 1.0g, repeated a bone marrow biopsy showed plasma cell 10-30%   02/27/2016 - 03/07/2016 Chemotherapy   Pomalidomide 4 mg daily on day 1-21,  dexamethasone 40 mg on day 1, 8 and 15, every 28 days, started on 02/27/2016, daratumumab weekly started on 03/07/2016, held after first dose, due to hospitalization and a severe pancytopenia, pomalidomide was stopped afterwards    03/10/2016 - 03/29/2016 Hospital Admission   Pt was admitted for sepsis from pneumonia, and severe pancytopenia. She required ICU stay for a few days due to hypotension, developed b/l pleural effusion required diuretics and b/l thoracentesis. She also required blood and plt transfusion, and prolonged neupogen injection for severe neutropenia. She was discharged home after 3 weeks hospital stay    04/26/2016 - 04/26/2019 Chemotherapy   Daratumumab restarted on 04/26/2016, Velcade 1.356mm2 and dexa 2079meekly start on 11/3, velcade held since 06/28/2016 due to CHF, dexa stopped on12/11/2017 due to high risk of fracture.  Dara reduced to every 4 weeks due to COVID-19 starting 10/23/18. Returned to every 3 weeks starting 12/17/18. Restarted every 2 weeks starting 02/22/19 Stopped on 04/26/19 due to disease progression.    08/26/2016 Echocardiogram   - Left ventricle: LVEF is approximately 35% with diffuse diffuse   hypokinesis with severe hypokinesis/ akinesis of the   inferior/inferoseptal walls. In comparison to echo images from   December 2017, there does not appear to be significant change The   cavity size was normal. Wall thickness was normal. Doppler   parameters are consistent with abnormal left ventricular   relaxation (grade 1 diastolic dysfunction). - Aortic valve: AV is thickened. 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 Present in echo from December 2017 There was   trivial regurgitation. - Right ventricle: Systolic function was mildly reduced. - Right atrium: The atrium was mildly dilated. - Pericardium, extracardiac: A trivial pericardial effusion was   identified.   12/03/2016 Echocardiogram    -Left Ventricle: Systolic function was   mildly to moderately reduced. The estimated ejection fraction was   in the range of 40% to 45%. Diffuse hypokinesis. Doppler   parameters are consistent with abnormal left ventricular   relaxation (grade 1 diastolic dysfunction). Doppler parameters   are consistent with indeterminate ventricular filling pressure. - Left atrium: The atrium was severely dilated. - Tricuspid valve: There was trivial regurgitation. - Pulmonary arteries: Systolic pressure was within the normal   range. PA peak pressure: 33 mm Hg (S). - Global longitudinal strain -10.5% (abnormal)   03/12/2017 Echocardiogram   ECHO 03/12/17 Study Conclusions - Left ventricle: The cavity size was normal. Wall thickness was   increased in a pattern of mild LVH. Systolic function was normal.   The estimated ejection fraction was in the range of 50% to 55%.   There is hypokinesis of the basalinferior myocardium. Doppler   parameters are consistent with abnormal left ventricular   relaxation (grade 1 diastolic dysfunction). - Aortic valve: There was trivial regurgitation. - Mitral valve: Calcified annulus. Mildly thickened leaflets .   There was trivial regurgitation. - Tricuspid valve: There was mild regurgitation. Impressions: - There has been mild improvement in EF since prior study.    12/12/2017 - 12/15/2017 Hospital Admission   Admission diagnosis: Pnuemonia and acute hypoxic  respiratory failure Additional comments: treated for pneumoania and acute hypoxic respiratory failure before she was discharged on 12/15/17 with oral anitbiotics. She did not require home oxygen.    12/30/2017 - 01/02/2018 Hospital Admission   Admission diagnosis: Pnuemonia  Additional comments: She was again hospitalized on 12/30/17 for pneumonia, ID work up was otherwise negative, she was discharged home with oral antibiotics.    04/01/2018 Imaging   04/01/2018 CXR IMPRESSION: Persisting airspace disease  superimposed on chronic changes, suggesting pneumonia given the history. Followup PA and lateral chest X-ray is recommended in 3-4 weeks following therapy to ensure resolution and exclude underlying malignancy.  Redemonstration of mid and lower thoracic compression fractures with associated thoracic kyphosis.   04/23/2018 Echocardiogram   04/23/2018 ECHO LV EF: 45% -   50%   01/07/2019 Imaging   MRI Brain 01/07/19 IMPRESSION: MRI HEAD IMPRESSION:   1. 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 Lesion closely approximates the left inner ear structures and involves the left Meckel's cave and left fifth cranial nerve, and most likely is the causative etiology for patient's underlying left ear and facial symptoms. Follow-up examination with postcontrast imaging suggested for complete evaluation. Inclusion of IAC sequences would likely be helpful for visualization. 2. Additional 2.7 cm lesion involving the right occipital calvarium with adjacent marrow signal abnormality, also likely related to multiple myeloma. Metastatic disease would be the primary differential consideration. 3. Underlying age-appropriate cerebral atrophy with mild chronic microvascular ischemic disease. No other acute intracranial abnormality.   MRA HEAD IMPRESSION:   1. Negative intracranial MRA with no large vessel occlusion, hemodynamically significant stenosis, or other acute vascular abnormality. 2. Petrous and cavernous left ICA is partially encased and surrounded by the left petrous apex lesion without significant irregularity or stenosis. 3. 2 mm right paraophthalmic aneurysm.   MRA NECK IMPRESSION:   Normal MRA of the neck with wide patency of the carotid and vertebral arteries bilaterally. No hemodynamically significant stenosis or other acute vascular abnormality  identified.    01/14/2019 - 01/27/2019 Radiation Therapy   By Dr. Tammi Klippel.    Diagnosis:   79 yo woman with skull base myeloma lesion causing left ear pain      Indication for treatment:  Palliative        Radiation treatment dates:   01/14/2019 - 01/27/2019   Site/dose:   Skull Base / 20 Gy in 10 fractions   Beams/energy:   3D, photons / 10X, 6X   04/01/2019 Pathology Results   Bone Marrow biopsy 04/01/19  DIAGNOSIS: BONE MARROW, ASPIRATE, CLOT, CORE: - Normocellular marrow with minimal involvement by plasma cell neoplasm - See comment PERIPHERAL BLOOD: - Macrocytic anemia and leukopenia -See complete blood count   04/09/2019 PET scan   PET 04/09/19  IMPRESSION: 1. Scattered lytic lesions of bone. A few of these demonstrate hypermetabolic activity, most notably the right occipital bone lesion; the lytic lesion in the approximately C7 spinous process; the lytic expansile lesion of the left fourth rib anteriorly; and a small lytic focus in the right femoral head. Other scattered lytic lesions have only low-grade activity. 2. Multiple fractures as detailed above. 3. Other imaging findings of potential clinical significance: Aortic Atherosclerosis (ICD10-I70.0). Coronary atherosclerosis.   05/10/2019 -  Chemotherapy   -CyBorD (cyclophosphamide (CYTOXAN) '500mg'$  ('300mg'$ /m2), Velcade 1.'3mg'$ /m2, and dexa '20mg'$ , weekly) starting 05/24/19.   -Due to neuropathy after 2 doses of Velcade, I  switched to weekly Kyprolis 3 weeks on/1 week off on 06/14/19.   -Chemo held for RT 07/05/19-07/22/19.   -Dexa stopped and Cytoxan dose reduced to 400 mg on day 1, 8 and 15 every 28 days, from cycle 3.     07/05/2019 - 07/20/2019 Radiation Therapy   By Dr. Tammi Klippel   Diagnosis:   79 yo woman with painful right occipital and left hip involvement by multiple myeloma      Indication for treatment:  Palliation        Radiation treatment dates:   07/05/19-07/20/19   Site/dose:    1.  The Right Occipital skull  lesion was treated to 20 Gy in 20 fractions of 2 Gy 2.  The Left Hip lesion was treated to 20 Gy in 20 fractions of 2 Gy   Beams/energy:      1.  The Right Occipital skull lesion was treated using a 3-field 3D set-up 2.  The Left Hip lesion was treated anterior and posterior fields.      CURRENT THERAPY:  -CyBorD (cyclophosphamide(CYTOXAN)'500mg'$  ('300mg'$ /m2),Velcade 1.'3mg'$ /m2, and dexa '20mg'$ , weekly) starting 05/24/19. -Due toneuropathyafter 2 doses of Velcade, I switched toweeklyKyprolis 3 weeks on/1 week off on 06/14/19.Reduced to 2 weeks on/1 week off starting 09/16/19 due to thrombocytopenia.  -Chemo heldfor RT 07/05/19-07/22/19. -Dexareduced to '20mg'$ andCytoxan dose reduced to 400 mg on day 1, 8 and 15 every 28 days, from cycle 3on 08/19/19, Cytoxan reduced to '300mg'$ on day 1, 8 every 21 daysafter 09/02/19.              -Will reduce regimen to every 2 weeks starting with C8 on 12/09/19 -Restart Zometa injections every6 weekson 06/21/19.  INTERVAL HISTORY:  Shannon Rush is here for a follow up and treatment. She presents to the clinic alone.  I changed her treatment to every 2 weeks about a month ago, she tolerates better with longer break. She has moderate fatigue for 3 to 4 days after treatment, recovers well.  Her pain is mild, she is able to walk and bike without significant difficulty.  No new pain.  Appetite and energy level are decent, no fever, cough, or other new symptoms.  She reports a small lump at the left side of her tongue, which she noticed 3 to 4 weeks ago, no pain or dysphagia.  Review of system otherwise negative.    MEDICAL HISTORY:  Past Medical History:  Diagnosis Date  . CAD (coronary artery disease)    a. inf STEMI (in [place] >>> [facility]) >>> LHC (8/15- [place]):  pRCA 60%, mid to dist RCA 50% >>> med rx  . Cardiomyopathy ([place])   . Cataract   . Chronic combined systolic and diastolic CHF (congestive  heart failure) ([place])    cancer medication related   . Cord compression ([facility]) 07/13/12   MRI- diffuse myeloma involvement of T-L spine  . History of radiation therapy 07/13/12-07/27/12   spinal cord compression T3-T10,left scapula  . 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 Multiple myeloma ([place]) 07/01/2012  . MVP (mitral valve prolapse)    a. trivial by echo 06/2017.  . OSTEOPOROSIS 06/11/2010   Multiple compression fractures; and spontaneous fracture of sternum Qualifier: Diagnosis of  By: [name], [name]     . Thoracic kyphosis 07/13/12   per MRI scan  . Unspecified deficiency anemia     SURGICAL HISTORY: Past Surgical History:  Procedure Laterality Date  . APPENDECTOMY    . CATARACT EXTRACTION, BILATERAL    .  CESAREAN SECTION     x2   . COLONOSCOPY  2007   neg with Dr. [name]  . ELBOW SURGERY    . HIP SURGERY  2009   left  . TUBAL LIGATION      I have reviewed the social history and family history with the patient and they are unchanged from previous note.  ALLERGIES:  is allergic to zithromax [azithromycin], zosyn [piperacillin sod-tazobactam so], and quinolones.  MEDICATIONS:  Current Outpatient Medications  Medication Sig Dispense Refill  . acetaminophen (TYLENOL) 500 MG tablet Take 500 mg by mouth every 6 (six) hours as needed for moderate pain or fever.    Marland Kitchen acyclovir (ZOVIRAX) 400 MG tablet Take 1 tablet (400 mg total) by mouth 2 (two) times daily. 180 tablet 3  . aspirin EC 81 MG tablet Take 1 tablet (81 mg total) by mouth daily. 90 tablet 3  . atorvastatin (LIPITOR) 20 MG tablet Take 1 tablet (20 mg total) by mouth daily. 10 tablet 0  . calcium-vitamin D (OSCAL WITH D) 500-200 MG-UNIT TABS tablet Take by mouth.    . cyclophosphamide (CYTOXAN) 50 MG capsule Take 6 capsules ('300mg'$ ) by mouth once every other week. 12 capsule 1  . doxycycline (VIBRA-TABS) 100 MG tablet Take 100 mg by mouth as needed (treatment for pneumonia).     . metoprolol succinate (TOPROL-XL) 25  MG 24 hr tablet TAKE 1 TABLET BY MOUTH  DAILY 90 tablet 3  . morphine (MSIR) 15 MG tablet Take 0.5 tablets (7.5 mg total) by mouth every 4 (four) hours as needed for severe pain. 20 tablet 0  . ondansetron (ZOFRAN) 8 MG tablet Take 1 tablet (8 mg total) by mouth every 8 (eight) hours as needed for nausea or vomiting. 20 tablet 3  . prochlorperazine (COMPAZINE) 10 MG tablet Take 1 tablet (10 mg total) by mouth every 6 (six) hours as needed (Nausea or vomiting). 30 tablet 1   No current facility-administered medications for this visit.    PHYSICAL EXAMINATION: ECOG PERFORMANCE STATUS: 2 - Symptomatic, <50% confined to bed  Vitals:   12/24/19 1301  BP: 104/68  Pulse: 74  Resp: 18  Temp: 97.7 F (36.5 C)  SpO2: 100%   Filed Weights   12/24/19 1301  Weight: 113 lb 8 oz (51.5 kg)    GENERAL:alert, no distress and comfortable SKIN: skin color, texture, turgor are normal, no rashes or significant lesions EYES: normal, Conjunctiva are pink and non-injected, sclera clear OROPHARYNX:no exudate, no erythema and lips, buccal mucosa, and tongue normal except two small lump at left side of her tongue, no ulceration.  NECK: supple, thyroid normal size, non-tender, without nodularity LYMPH:  no palpable lymphadenopathy in the cervical, axillary  LUNGS: clear to auscultation and percussion with normal breathing effort HEART: regular rate & rhythm and no murmurs and no lower extremity edema ABDOMEN:abdomen soft, non-tender and normal bowel sounds Musculoskeletal:no cyanosis of digits and no clubbing  NEURO: alert & oriented x 3 with fluent speech, no focal motor/sensory deficits  LABORATORY DATA:  I have reviewed the data as listed CBC Latest Ref Rng & Units 12/24/2019 12/09/2019 11/25/2019  WBC 4.0 - 10.5 K/uL 2.9(L) 3.2(L) 3.2(L)  Hemoglobin 12.0 - 15.0 g/dL 9.0(L) 9.7(L) 10.2(L)  Hematocrit 36 - 46 % 27.5(L) 30.5(L) 31.4(L)  Platelets 150 - 400 K/uL 167 183 91(L)     CMP Latest Ref Rng &  Units 12/09/2019 11/25/2019 11/04/2019  Glucose 70 - 99 mg/dL 90 78 103(H)  BUN 8 -  23 mg/dL '15 14 18  '$ Creatinine 0.44 - 1.00 mg/dL 0.77 0.79 0.73  Sodium 135 - 145 mmol/L 140 140 140  Potassium 3.5 - 5.1 mmol/L 3.9 4.2 3.8  Chloride 98 - 111 mmol/L 106 105 108  CO2 22 - 32 mmol/L 23 24 21(L)  Calcium 8.9 - 10.3 mg/dL 9.1 9.1 8.7(L)  Total Protein 6.5 - 8.1 g/dL 6.1(L) 6.4(L) 5.8(L)  Total Bilirubin 0.3 - 1.2 mg/dL 1.2 0.8 0.7  Alkaline Phos 38 - 126 U/L 48 58 53  AST 15 - 41 U/L 21 18 14(L)  ALT 0 - 44 U/L '14 14 10      '$ RADIOGRAPHIC STUDIES: I have personally reviewed the radiological images as listed and agreed with the findings in the report. No results found.   ASSESSMENT & PLAN:  Shannon Rush is a 79 y.o. female with   1. IgG kappa Multiple myeloma s/p Auto SCT, intermediate risk, relapsed in 7/2017and progressed again in Oct 2020 -Diagnosed in 05/2012. Treated with induction chemo, radiation and BM transplant, but unfortunately progressed while on maintenance Revlimid 01/2016. She is currently onDaratumumabinfusion monotherapy.  -PETimagesfrom 04/09/19 showedScattered lytic lesions of bone. A few of these demonstrate hypermetabolic activity, most notably the right occipital bone lesion, C7 spinous process, Left 4th rib, andright femoral head.  -Herbone marrow biopsy from 04/02/19 showsnormocellular marrow with minimal involvement by plasma cell neoplasm(2%). -Unfortunately she has progressedpreviouslywith increased M-protein and more symptomatic bone pain from MM, s/p palliative radiation to left hip and right scalp -Based on Dr. Noah Delaine recommendation I changedher treatment to CyBorD (oral cyclophosphamide '500mg'$ , Velcade 1.'3mg'$ /m2, and dexa '20mg'$ , weekly) started on11/9/20.After 2 doses of Velcade, I switched toweeklyKyprolis3 weeks on/1 week offstarting 06/14/19 due to neuropathy.Chemo waspreviouslyheld forpalliative RT.Cytoxan has been dose reduced and  held as needed due to thrombocytopenia. Kyprolis reduced to 2 weeks on/1 week off starting 09/16/19 due to thrombocytopenia. Her M-protein has reduced to 0.1 in the past two months. We de-escalated her chemo to every 2 weeks starting with C8 on 12/09/19.  -Given frequent infusions I offered her PAC placement, she declined.She has received both her [place] -she is clinically stable, tolerates treatment every 2 weeks much better, able to function at home. -Continue current treatment and monitoring   2.Left hip pain and fracture in 09/07/19 -she had left hip surgery with rod in 2009  -She developed new left hip pain on weightbearingIn Dec 2020, no painat resting -She received target RT 07/05/19-07/20/19. Now her pain is nearly resolved, minimalnow -She was seen to have left periprosthetic hip fracture when she went to ED on 09/07/19. She is able to bear some weight with pain 1/10. She takes Tylenol but has quick release Morphine half tablet for significant pain.  -She has walker which she only uses as needed.    3. Left petrous apex bone lesion with hearing lossandRight OccipitalskullLesion  -Her MRI brain from 01/07/19 showedabnormal osseous lesion measuring up to approximately 5.9 cm centered at the left petrous apex. Thismost likely is the cause of her underlyingleft ear and balancesymptoms. -Given she is symptomatic, sheunderwent target RTto skull case myeloma lesionwith Dr Tammi Klippel 7/1-7/15/20. This is negative on9/25/20PET scan -Her PET from 04/09/19 shows lesion with hypermetabolic activity in this area concerning for MM progression here.She was symptomatic. -She recently completedRT to right occipital lesion per Dr. Manning12/21/20-07/20/19.Tolerated well.She has moderate hair loss at RT site.  -Her ear fullness and altered balance has much improved, continue balance exercises   4. History of multiple  pneumonia andbronchitis -She has had multiple episodes of  pneumonia, required hospitalization,and bronchitis, required antibiotics -She has recovered well, will continue monitoring. I encouraged her to do spirometry,and exercise. -Her vaccines are up-to-date.She has received COVID vaccines  5. Pancytopenia -She mainly has anemia and Thrombocytopenia concerningwhich occurred with recent(03/2019) disease progression. -Did give bloodtransfusionon 06/04/19 -Has recurred with oral Cytoxan. Stable anemia. Thrombocytopenia and neutropenia resolved again today  6. CHF, CAD, inferior STEMI in 02/2014, H[facility] -She is on ASA, BB, statin. She will continue to f/u with Dr. Meda Coffee -BPwell controlled and normal lately. -10/2020echo showed normal EF, will continue monitoring   7. Osteoporosis with multiple fractures  -Last DEXA in 2015 showed worsening osteoporosis. She declined repeat DEXA.  -She had a fall induced fracture in 01/2018. -She was on Zometa for 4 years, stopped after April 2018  -I restarted herZometaq6weeksto help strengthen her boneon12/7/20. Will monitor her dental health -She was seen to have left periprosthetic hip fracture when she went to ED on 09/07/19.   8. Peripheral neuropathy in feet, G2 -Secondary to previous chemo. -She previously declined Gabapentin -Worsenedsince she started Velcade. Now she is onKyprolis since 11/20/21which cause less neuropathy.Stable.  9. Goal of care discussion  -She is full code now. Treatment goal is palliative.   10Steroid induced hyperglycemia -Secondary to steroids,her random blood glucose was 217on 06/07/19. Has much improved recently. -I encouraged her to drink plenty of water and reduce sugar and carbohydrate in diet.  -06/21/19 A1c at 6.3. -Dexamethasone has been reduced to 20 mg every 2 weeks,BG has been normal lately   Plan -Labs reviewed and adequate to proceed Kyprolis, dexa and cytoxan todayand continue every 2 weeks -Zometa injection today -f/u in  2 weeks   No problem-specific Assessment & Plan notes found for this encounter.   No orders of the defined types were placed in this encounter.  All questions were answered. The patient knows to call the clinic with any problems, questions or concerns. No barriers to learning was detected. The total time spent in the appointment was 30 minutes.     [name], MD 12/24/2019   I, Joslyn Devon, am acting as scribe for [name], MD.   I have reviewed the above documentation for accuracy and completeness, and I agree with the above.

## 2019-12-24 ENCOUNTER — Encounter: Payer: Self-pay | Admitting: Cardiology

## 2019-12-24 ENCOUNTER — Inpatient Hospital Stay: Payer: Medicare Other

## 2019-12-24 ENCOUNTER — Other Ambulatory Visit: Payer: Self-pay

## 2019-12-24 ENCOUNTER — Ambulatory Visit: Payer: Medicare Other | Admitting: Cardiology

## 2019-12-24 ENCOUNTER — Inpatient Hospital Stay: Payer: Medicare Other | Attending: Medical

## 2019-12-24 ENCOUNTER — Telehealth: Payer: Self-pay | Admitting: Hematology

## 2019-12-24 ENCOUNTER — Inpatient Hospital Stay: Payer: Medicare Other | Admitting: Hematology

## 2019-12-24 VITALS — BP 110/64 | HR 71 | Ht 66.5 in | Wt 114.0 lb

## 2019-12-24 VITALS — BP 104/68 | HR 74 | Temp 97.7°F | Resp 18 | Ht 66.5 in | Wt 113.5 lb

## 2019-12-24 DIAGNOSIS — Z9221 Personal history of antineoplastic chemotherapy: Secondary | ICD-10-CM | POA: Insufficient documentation

## 2019-12-24 DIAGNOSIS — E785 Hyperlipidemia, unspecified: Secondary | ICD-10-CM | POA: Diagnosis not present

## 2019-12-24 DIAGNOSIS — Z79899 Other long term (current) drug therapy: Secondary | ICD-10-CM | POA: Diagnosis not present

## 2019-12-24 DIAGNOSIS — C9000 Multiple myeloma not having achieved remission: Secondary | ICD-10-CM

## 2019-12-24 DIAGNOSIS — I2510 Atherosclerotic heart disease of native coronary artery without angina pectoris: Secondary | ICD-10-CM

## 2019-12-24 DIAGNOSIS — G629 Polyneuropathy, unspecified: Secondary | ICD-10-CM | POA: Diagnosis not present

## 2019-12-24 DIAGNOSIS — I252 Old myocardial infarction: Secondary | ICD-10-CM | POA: Diagnosis not present

## 2019-12-24 DIAGNOSIS — Z95828 Presence of other vascular implants and grafts: Secondary | ICD-10-CM

## 2019-12-24 DIAGNOSIS — Z9481 Bone marrow transplant status: Secondary | ICD-10-CM | POA: Diagnosis not present

## 2019-12-24 DIAGNOSIS — Z7982 Long term (current) use of aspirin: Secondary | ICD-10-CM | POA: Insufficient documentation

## 2019-12-24 DIAGNOSIS — D6481 Anemia due to antineoplastic chemotherapy: Secondary | ICD-10-CM

## 2019-12-24 DIAGNOSIS — T451X5A Adverse effect of antineoplastic and immunosuppressive drugs, initial encounter: Secondary | ICD-10-CM

## 2019-12-24 DIAGNOSIS — M818 Other osteoporosis without current pathological fracture: Secondary | ICD-10-CM

## 2019-12-24 DIAGNOSIS — C9002 Multiple myeloma in relapse: Secondary | ICD-10-CM | POA: Diagnosis not present

## 2019-12-24 DIAGNOSIS — Z5112 Encounter for antineoplastic immunotherapy: Secondary | ICD-10-CM | POA: Insufficient documentation

## 2019-12-24 DIAGNOSIS — Z923 Personal history of irradiation: Secondary | ICD-10-CM | POA: Insufficient documentation

## 2019-12-24 DIAGNOSIS — I428 Other cardiomyopathies: Secondary | ICD-10-CM

## 2019-12-24 LAB — CBC WITH DIFFERENTIAL (CANCER CENTER ONLY)
Abs Immature Granulocytes: 0.02 10*3/uL (ref 0.00–0.07)
Basophils Absolute: 0 10*3/uL (ref 0.0–0.1)
Basophils Relative: 1 %
Eosinophils Absolute: 0.1 10*3/uL (ref 0.0–0.5)
Eosinophils Relative: 2 %
HCT: 27.5 % — ABNORMAL LOW (ref 36.0–46.0)
Hemoglobin: 9 g/dL — ABNORMAL LOW (ref 12.0–15.0)
Immature Granulocytes: 1 %
Lymphocytes Relative: 2 %
Lymphs Abs: 0.1 10*3/uL — ABNORMAL LOW (ref 0.7–4.0)
MCH: 37.8 pg — ABNORMAL HIGH (ref 26.0–34.0)
MCHC: 32.7 g/dL (ref 30.0–36.0)
MCV: 115.5 fL — ABNORMAL HIGH (ref 80.0–100.0)
Monocytes Absolute: 0.4 10*3/uL (ref 0.1–1.0)
Monocytes Relative: 15 %
Neutro Abs: 2.3 10*3/uL (ref 1.7–7.7)
Neutrophils Relative %: 79 %
Platelet Count: 167 10*3/uL (ref 150–400)
RBC: 2.38 MIL/uL — ABNORMAL LOW (ref 3.87–5.11)
RDW: 15.9 % — ABNORMAL HIGH (ref 11.5–15.5)
WBC Count: 2.9 10*3/uL — ABNORMAL LOW (ref 4.0–10.5)
nRBC: 0 % (ref 0.0–0.2)

## 2019-12-24 LAB — CK TOTAL AND CKMB (NOT AT ARMC)
CK, MB: 3.4 ng/mL (ref 0.5–5.0)
Relative Index: INVALID (ref 0.0–2.5)
Total CK: 62 U/L (ref 38–234)

## 2019-12-24 LAB — CMP (CANCER CENTER ONLY)
ALT: 11 U/L (ref 0–44)
AST: 16 U/L (ref 15–41)
Albumin: 3.7 g/dL (ref 3.5–5.0)
Alkaline Phosphatase: 51 U/L (ref 38–126)
Anion gap: 12 (ref 5–15)
BUN: 16 mg/dL (ref 8–23)
CO2: 23 mmol/L (ref 22–32)
Calcium: 9.3 mg/dL (ref 8.9–10.3)
Chloride: 107 mmol/L (ref 98–111)
Creatinine: 0.78 mg/dL (ref 0.44–1.00)
GFR, Est AFR Am: >60 mL/min (ref >60)
GFR, Estimated: >60 mL/min (ref >60)
Glucose, Bld: 87 mg/dL (ref 70–99)
Potassium: 4.2 mmol/L (ref 3.5–5.1)
Sodium: 142 mmol/L (ref 135–145)
Total Bilirubin: 0.9 mg/dL (ref 0.3–1.2)
Total Protein: 5.8 g/dL — ABNORMAL LOW (ref 6.5–8.1)

## 2019-12-24 MED ORDER — ZOLEDRONIC ACID 4 MG/5ML IV CONC
3.5000 mg | Freq: Once | INTRAVENOUS | Status: AC
  Administered 2019-12-24: 3.5 mg via INTRAVENOUS
  Filled 2019-12-24: qty 4.38

## 2019-12-24 MED ORDER — SODIUM CHLORIDE 0.9 % IV SOLN
Freq: Once | INTRAVENOUS | Status: AC
  Filled 2019-12-24: qty 250

## 2019-12-24 MED ORDER — PALONOSETRON HCL INJECTION 0.25 MG/5ML
0.2500 mg | Freq: Once | INTRAVENOUS | Status: AC
  Administered 2019-12-24: 0.25 mg via INTRAVENOUS

## 2019-12-24 MED ORDER — ZOLEDRONIC ACID 4 MG/100ML IV SOLN
INTRAVENOUS | Status: AC
  Filled 2019-12-24: qty 100

## 2019-12-24 MED ORDER — DEXTROSE 5 % IV SOLN
69.0000 mg/m2 | Freq: Once | INTRAVENOUS | Status: AC
  Administered 2019-12-24: 110 mg via INTRAVENOUS
  Filled 2019-12-24: qty 30

## 2019-12-24 MED ORDER — DEXAMETHASONE 4 MG PO TABS
20.0000 mg | ORAL_TABLET | Freq: Once | ORAL | Status: AC
  Administered 2019-12-24: 20 mg via ORAL

## 2019-12-24 MED ORDER — DEXAMETHASONE 4 MG PO TABS
ORAL_TABLET | ORAL | Status: AC
  Filled 2019-12-24: qty 5

## 2019-12-24 MED ORDER — PALONOSETRON HCL INJECTION 0.25 MG/5ML
INTRAVENOUS | Status: AC
  Filled 2019-12-24: qty 5

## 2019-12-24 NOTE — Patient Instructions (Signed)
[facility] Discharge Instructions for Patients Receiving Chemotherapy  Today you received the following chemotherapy agent: Carfilzomib (Kyprolis)  To help prevent nausea and vomiting after your treatment, we encourage you to take your nausea medication as directed by your MD.   If you develop nausea and vomiting that is not controlled by your nausea medication, call the clinic.   BELOW ARE SYMPTOMS THAT SHOULD BE REPORTED IMMEDIATELY:  *FEVER GREATER THAN 100.5 F  *CHILLS WITH OR WITHOUT FEVER  NAUSEA AND VOMITING THAT IS NOT CONTROLLED WITH YOUR NAUSEA MEDICATION  *UNUSUAL SHORTNESS OF BREATH  *UNUSUAL BRUISING OR BLEEDING  TENDERNESS IN MOUTH AND THROAT WITH OR WITHOUT PRESENCE OF ULCERS  *URINARY PROBLEMS  *BOWEL PROBLEMS  UNUSUAL RASH Items with * indicate a potential emergency and should be followed up as soon as possible.  Feel free to call the clinic should you have any questions or concerns. The clinic phone number is [phone].  Please show the [facility] at check-in to the Emergency Department and triage nurse.   Zoledronic Acid injection (Hypercalcemia, Oncology) What is this medicine? ZOLEDRONIC ACID (ZOE le dron ik AS id) lowers the amount of calcium loss from bone. It is used to treat too much calcium in your blood from cancer. It is also used to prevent complications of cancer that has spread to the bone. This medicine may be used for other purposes; ask your health care provider or pharmacist if you have questions. COMMON BRAND NAME(S): Zometa What should I tell my health care provider before I take this medicine? They need to know if you have any of these conditions:  aspirin-sensitive asthma  cancer, especially if you are receiving medicines used to treat cancer  dental disease or wear dentures  infection  kidney disease  receiving corticosteroids like dexamethasone or prednisone  an unusual or allergic reaction to  zoledronic acid, other medicines, foods, dyes, or preservatives  pregnant or trying to get pregnant  breast-feeding How should I use this medicine? This medicine is for infusion into a vein. It is given by a health care professional in a hospital or clinic setting. Talk to your pediatrician regarding the use of this medicine in children. Special care may be needed. Overdosage: If you think you have taken too much of this medicine contact a poison control center or emergency room at once. NOTE: This medicine is only for you. Do not share this medicine with others. What if I miss a dose? It is important not to miss your dose. Call your doctor or health care professional if you are unable to keep an appointment. What may interact with this medicine?  certain antibiotics given by injection  NSAIDs, medicines for pain and inflammation, like ibuprofen or naproxen  some diuretics like bumetanide, furosemide  teriparatide  thalidomide This list may not describe all possible interactions. Give your health care provider a list of all the medicines, herbs, non-prescription drugs, or dietary supplements you use. Also tell them if you smoke, drink alcohol, or use illegal drugs. Some items may interact with your medicine. What should I watch for while using this medicine? Visit your doctor or health care professional for regular checkups. It may be some time before you see the benefit from this medicine. Do not stop taking your medicine unless your doctor tells you to. Your doctor may order blood tests or other tests to see how you are doing. Women should inform their doctor if they wish to become pregnant or  think they might be pregnant. There is a potential for serious side effects to an unborn child. Talk to your health care professional or pharmacist for more information. You should make sure that you get enough calcium and vitamin D while you are taking this medicine. Discuss the foods you eat and  the vitamins you take with your health care professional. Some people who take this medicine have severe bone, joint, and/or muscle pain. This medicine may also increase your risk for jaw problems or a broken thigh bone. Tell your doctor right away if you have severe pain in your jaw, bones, joints, or muscles. Tell your doctor if you have any pain that does not go away or that gets worse. Tell your dentist and dental surgeon that you are taking this medicine. You should not have major dental surgery while on this medicine. See your dentist to have a dental exam and fix any dental problems before starting this medicine. Take good care of your teeth while on this medicine. Make sure you see your dentist for regular follow-up appointments. What side effects may I notice from receiving this medicine? Side effects that you should report to your doctor or health care professional as soon as possible:  allergic reactions like skin rash, itching or hives, swelling of the face, lips, or tongue  anxiety, confusion, or depression  breathing problems  changes in vision  eye pain  feeling faint or lightheaded, falls  jaw pain, especially after dental work  mouth sores  muscle cramps, stiffness, or weakness  redness, blistering, peeling or loosening of the skin, including inside the mouth  trouble passing urine or change in the amount of urine Side effects that usually do not require medical attention (report to your doctor or health care professional if they continue or are bothersome):  bone, joint, or muscle pain  constipation  diarrhea  fever  hair loss  irritation at site where injected  loss of appetite  nausea, vomiting  stomach upset  trouble sleeping  trouble swallowing  weak or tired This list may not describe all possible side effects. Call your doctor for medical advice about side effects. You may report side effects to FDA at 1-800-FDA-1088. Where should I keep my  medicine? This drug is given in a hospital or clinic and will not be stored at home. NOTE: This sheet is a summary. It may not cover all possible information. If you have questions about this medicine, talk to your doctor, pharmacist, or health care provider.  2020 Elsevier/Gold Standard (2013-11-27 14:19:39)

## 2019-12-24 NOTE — Progress Notes (Signed)
Cardiology Office Note    Date:  12/24/2019   ID:  Shannon Rush, DOB 14-Feb-1941, MRN [id number]  PCP:  [name], MD  Cardiologist:  Ena Dawley, MD  Electrophysiologist:  None   Chief Complaint: routine 6 month f/u, last seen 04/2019  History of Present Illness:   Shannon Rush is a 79 y.o. female retired [employer] member with history of multiple myeloma s/p bone marrow transplant (followed at [facility] by Dr. [name] and in [place] by Dr. Burr Medico), osteoporosis, HLD, CAD (inferior STEMI s/p thrombolytics 02/2014), chronic combined CHF (?chemo induced) who presents to clinic for routine 6 month follow-up. Despite history outlined below she is in good shape and often rides her bike to office visits.  She was originally seen by Dr. Johnsie Cancel in 12/2012 for abnormal echo. Notes from Care Everywhere at [facility] indicate TEE demonstrated what appeared to be a Lambl's excrescence. She was admitted 02/2014 to the [facility] with an inferior STEMI. She received thrombolytics. Cardiac catheterization demonstrated 60% proximal RCA, mid to distal RCA 50% (lesion segmental and thrombotic). Echocardiogram demonstrated normal LV function. No intervention was performed. Recommendation was for the patient to return to [facility] and have a nuclear stress test in 6 weeks. I do not see this was ordered after she reestablished care in our practice with Dr. Meda Coffee. Patient was on Revlimid around that time (for pancytopenia from chemotherapy) and the DC summary from the hospital in [place] reportedly cited this as a possible cause for her STEMI. She has a known hx of multiple myeloma since 2013 treated with Revlimid, subsquently switched to pomalidomide, dexamethasone, daratumumab with resultant pancytopenia. She was admitted from 02/2016 for sepsis and acute respiratory failure 2/2 HCAP with acute on chronic systolic CHF with pleural effusions. The pt was shocky and  required IVF, transfer to ICU, and IV pressors. She was noted to be thrombocytopenic and her Plavix was stopped. She had persistent hypoxia and moderate bilateral effusions. She was given Lasix but this was later stopped secondary to hypotension.She required bilateral thoracocentesis, IV diuresis and blood transfusions with symptomatic improvement. She was discharged on Lasix PRN. She was admitted with bilateral pneumonia in 12/2017 and sustained a pelvic fracture in 01/2018. She was on Velcade and Zometa at one point. Regimen was eventually changed to Daratumumab/Dexamathasone. Dexamethasone was stopped in 07/2018 due to controlled MM and risk of fracture, and she's since been followed by heme-onc while receiving Dara treatments.  2D echoes continue to be followed approximately every 3 months while on chemotherapy per Dr. Francesca Oman request, although the patient has requested to push them out further between. Last echo 11/2018 showed EF 40-45%, impaired diastolic relaxation, moderate LAE, mild RAE, sclerotic aortic valve with small oscillating density (described on previous studies; possible Lambl's excrescence) and trace AI. Dr. Meda Coffee reviewed study and felt it was stable. Last labs 03/2019 showed Hgb 10.5, plt 131, K 4.0, Cr 0.81, normal LFTs, 03/2018 showed LDL 57 (Dr. Meda Coffee has previously recommended to reduce atorvastatin to 14m daily and this level reflects that).  Ms. MGruenbergreturns for follow-up today and is doing great from a cardiac standpoint without angina, dyspnea, edema, orthopnea, syncope or palpitations. Blood pressure remains low normal which has prohibited aggressive med titration in the past. She continues to ride her bike in the community and did so to attend today's appointment.   She has lost some weight, but has good appetite. [facility] and do strengthening exercises. She has no cardiac symptoms - no palpitations, no orthostatic hypotension, LE edema,  orthopnea, PND. Tolerates her meds well. Most recent echocardiogram from 11/08/2019 showed LVEF 55-60%. G1DD.  Past Medical History:  Diagnosis Date  . CAD (coronary artery disease)    a. inf STEMI (in [place] >>> lytics) >>> LHC (8/15- [place]):  pRCA 60%, mid to dist RCA 50% >>> med rx  . Cardiomyopathy ([place])   . Cataract   . Chronic combined systolic and diastolic CHF (congestive heart failure) ([place])    cancer medication related   . Cord compression ([place]) 07/13/12   MRI- diffuse myeloma involvement of T-L spine  . History of radiation therapy 07/13/12-07/27/12   spinal cord compression T3-T10,left scapula  . 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 Multiple myeloma ([place]) 07/01/2012  . MVP (mitral valve prolapse)    a. trivial by echo 06/2017.  . OSTEOPOROSIS 06/11/2010   Multiple compression fractures; and spontaneous fracture of sternum Qualifier: Diagnosis of  By: [name], [name]     . Thoracic kyphosis 07/13/12   per MRI scan  . Unspecified deficiency anemia     Past Surgical History:  Procedure Laterality Date  . APPENDECTOMY    . CATARACT EXTRACTION, BILATERAL    . CESAREAN SECTION     x2   . COLONOSCOPY  2007   neg with Dr. [name]  . ELBOW SURGERY    . HIP SURGERY  2009   left  . TUBAL LIGATION      Current Medications: Current Meds  Medication Sig  . acetaminophen (TYLENOL) 500 MG tablet Take 500 mg by mouth every 6 (six) hours as needed for moderate pain or fever.  Marland Kitchen acyclovir (ZOVIRAX) 400 MG tablet Take 1 tablet (400 mg total) by mouth 2 (two) times daily.  Marland Kitchen aspirin EC 81 MG tablet Take 1 tablet (81 mg total) by mouth daily.  Marland Kitchen atorvastatin (LIPITOR) 20 MG tablet Take 1 tablet (20 mg total) by mouth daily.  . calcium-vitamin D (OSCAL WITH D) 500-200 MG-UNIT TABS tablet Take by mouth.  . cyclophosphamide (CYTOXAN) 50 MG capsule Take 6 capsules (378m) by mouth once every other week.  . doxycycline (VIBRA-TABS) 100 MG tablet Take 100 mg by  mouth as needed (treatment for pneumonia).   . metoprolol succinate (TOPROL-XL) 25 MG 24 hr tablet TAKE 1 TABLET BY MOUTH  DAILY  . morphine (MSIR) 15 MG tablet Take 0.5 tablets (7.5 mg total) by mouth every 4 (four) hours as needed for severe pain.  .Marland Kitchenondansetron (ZOFRAN) 8 MG tablet Take 1 tablet (8 mg total) by mouth every 8 (eight) hours as needed for nausea or vomiting.  . prochlorperazine (COMPAZINE) 10 MG tablet Take 1 tablet (10 mg total) by mouth every 6 (six) hours as needed (Nausea or vomiting).      Allergies:   Zithromax [azithromycin], Zosyn [piperacillin sod-tazobactam so], and Quinolones   Social History   Socioeconomic History  . Marital status: Married    Spouse name: LFritz Pickerel . Number of children: 2  . Years of education: Not on file  . Highest education level: Doctorate  Occupational History  . Occupation: RMuseum/gallery conservator [employer] Tobacco Use  . Smoking status: Never Smoker  . Smokeless tobacco: Never Used  Vaping Use  . Vaping Use: Never used  Substance and Sexual Activity  . Alcohol use: Yes    Alcohol/week: 7.0  standard drinks    Types: 7 Standard drinks or equivalent per week    Comment: 1 glass of wine or beer 3-5 days per week  . Drug use: No  . Sexual activity: Yes    Birth control/protection: Post-menopausal  Other Topics Concern  . Not on file  Social History Narrative   Who lives with you: spouse, 2 story home, does not have problems with the stairs, has walk in shower, handrails.   Any pets: Dog, Brayden, black lab/boxer mix   Diet: Patient has a varied diet of protein, vegetables, starches.  Limits red meats.   Exercise: Patient exercises at least 3 times a week for over an hour.   Seatbelts: Patient reports wearing her seatbelt when in vehicle.    Nancy Fetter Exposure/Protection: Patient reports wearing sunscreen daily.   Hobbies: gardening, swimming, reading, biking, [facility]      Important Relationships &  Pets: Husband, two daughters, son in Sports coach and grandchildren / Black lab/boxer mix named Braden    Current Stressors: Other son-in-law's recent death, "Corporate treasurer"    Work / Education:  PhD in psychology/ Retired Professor at [employer] / Personal Beliefs:   "No organized religionRetail banker / Fun: Getting together with friends, politics and grandchildren                                                                                                    Bikes to shopping and appointments. Biked 5 miles this morning to this appointment.   Social Determinants of Health   Financial Resource Strain:   . Difficulty of Paying Living Expenses:   Food Insecurity:   . Worried About Charity fundraiser in the Last Year:   . Arboriculturist in the Last Year:   Transportation Needs:   . Film/video editor (Medical):   Marland Kitchen Lack of Transportation (Non-Medical):   Physical Activity:   . Days of Exercise per Week:   . Minutes of Exercise per Session:   Stress:   . Feeling of Stress :   Social Connections:   . Frequency of Communication with Friends and Family:   . Frequency of Social Gatherings with Friends and Family:   . Attends Religious Services:   . Active Member of Clubs or Organizations:   . Attends Archivist Meetings:   Marland Kitchen Marital Status:      Family History:  The patient's \ family history includes Cancer in her brother; Glaucoma in her father and mother; Heart disease in her father and mother; Heart failure in her father; Peripheral vascular disease in her mother; Prostate cancer in her brother; Raynaud syndrome in her daughter, daughter, and mother. There is no history of Breast cancer or Colon cancer.  ROS:   Please see the history of present illness.  All other systems are reviewed and otherwise negative.    EKGs/Labs/Other Studies Reviewed:    Studies reviewed were summarized above.   EKG:  EKG is ordered today, personally reviewed,  demonstrating NSR 72 bpm, inferior ST-T  wabe changes as well as V3-V6 stable compared to prior.  Recent Labs: 12/09/2019: ALT 14; BUN 15; Creatinine 0.77; Hemoglobin 9.7; Platelet Count 183; Potassium 3.9; Sodium 140  Recent Lipid Panel    Component Value Date/Time   CHOL 130 05/10/2019 0937   CHOL 121 12/31/2016 1543   TRIG 44 05/10/2019 0937   HDL 59 05/10/2019 0937   HDL 62 12/31/2016 1543   CHOLHDL 2.2 05/10/2019 0937   VLDL 9 05/10/2019 0937   LDLCALC 62 05/10/2019 0937   LDLCALC 39 12/31/2016 1543    PHYSICAL EXAM:    VS:  BP 110/64   Pulse 71   Ht 5' 6.5" (1.689 m)   Wt 114 lb (51.7 kg)   SpO2 95%   BMI 18.12 kg/m   BMI: Body mass index is 18.12 kg/m.  GEN: Thin WF in no acute distress HEENT: normocephalic, atraumatic Neck: no JVD, carotid bruits, or masses Cardiac: RRR; no murmurs, rubs, or gallops, no edema  Respiratory:  clear to auscultation bilaterally, normal work of breathing GI: soft, nontender, nondistended, + BS MS: significant kyphosis Skin: warm and dry, no rash Neuro:  Alert and Oriented x 3, Strength and sensation are intact, follows commands Psych: euthymic mood, full affect  Wt Readings from Last 3 Encounters:  12/24/19 114 lb (51.7 kg)  12/09/19 112 lb 3.2 oz (50.9 kg)  11/18/19 114 lb 3.2 oz (51.8 kg)     ASSESSMENT & PLAN:   1. CAD - asymptomatic, no recent angina. Continue ASA, statin and BB. Blood pressure well controlled. 2. Chronic diastolic CHF/ h/o cardiomyopathy - appears euvolemic. LVEF has improved, now 55-60%. Of note, she is not on ACEI/ARB/spiro due to history of softer BP. She is NYHA class I. 3. Hyperlipidemia - continue statin.  4. Multiple myeloma - followed closely in the cancer center. 5. 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  Disposition: F/u with Dr. Meda Coffee in 6 months.  Medication Adjustments/Labs and Tests Ordered: Current  medicines are reviewed at length with the patient today.  Concerns regarding medicines are outlined above. Medication changes, Labs and Tests ordered today are summarized above and listed in the Patient Instructions accessible in Encounters.   Signed, Ena Dawley, MD  12/24/2019 10:06 AM    [address], [place], [place]  [address] Phone: [phone]; Fax: [phone]

## 2019-12-24 NOTE — Patient Instructions (Signed)
Your physician recommends that you continue on your current medications as directed. Please refer to the Current Medication list given to you today.  Your physician recommends that you schedule a follow-up appointment in: [place]

## 2019-12-24 NOTE — Telephone Encounter (Signed)
Scheduled appt per 6/11 los.  Pt will get an updated appt calendar at their next scheduled appt.

## 2019-12-26 ENCOUNTER — Encounter: Payer: Self-pay | Admitting: Hematology

## 2020-01-02 ENCOUNTER — Other Ambulatory Visit: Payer: Self-pay | Admitting: Cardiology

## 2020-01-06 ENCOUNTER — Encounter: Payer: Self-pay | Admitting: Adult Health

## 2020-01-06 ENCOUNTER — Inpatient Hospital Stay: Payer: Medicare Other

## 2020-01-06 ENCOUNTER — Other Ambulatory Visit: Payer: Self-pay

## 2020-01-06 ENCOUNTER — Inpatient Hospital Stay: Payer: Medicare Other | Admitting: Adult Health

## 2020-01-06 VITALS — BP 102/62 | HR 81 | Temp 98.9°F | Resp 16 | Ht 66.5 in | Wt 115.3 lb

## 2020-01-06 DIAGNOSIS — C9002 Multiple myeloma in relapse: Secondary | ICD-10-CM

## 2020-01-06 DIAGNOSIS — Z9221 Personal history of antineoplastic chemotherapy: Secondary | ICD-10-CM | POA: Diagnosis not present

## 2020-01-06 DIAGNOSIS — Z5112 Encounter for antineoplastic immunotherapy: Secondary | ICD-10-CM | POA: Diagnosis not present

## 2020-01-06 DIAGNOSIS — Z923 Personal history of irradiation: Secondary | ICD-10-CM | POA: Diagnosis not present

## 2020-01-06 DIAGNOSIS — E785 Hyperlipidemia, unspecified: Secondary | ICD-10-CM | POA: Diagnosis not present

## 2020-01-06 DIAGNOSIS — Z7982 Long term (current) use of aspirin: Secondary | ICD-10-CM | POA: Diagnosis not present

## 2020-01-06 DIAGNOSIS — Z9481 Bone marrow transplant status: Secondary | ICD-10-CM | POA: Diagnosis not present

## 2020-01-06 DIAGNOSIS — C9000 Multiple myeloma not having achieved remission: Secondary | ICD-10-CM

## 2020-01-06 DIAGNOSIS — Z79899 Other long term (current) drug therapy: Secondary | ICD-10-CM | POA: Diagnosis not present

## 2020-01-06 DIAGNOSIS — I252 Old myocardial infarction: Secondary | ICD-10-CM | POA: Diagnosis not present

## 2020-01-06 DIAGNOSIS — G629 Polyneuropathy, unspecified: Secondary | ICD-10-CM | POA: Diagnosis not present

## 2020-01-06 LAB — CBC WITH DIFFERENTIAL (CANCER CENTER ONLY)
Abs Immature Granulocytes: 0.02 10*3/uL (ref 0.00–0.07)
Basophils Absolute: 0 10*3/uL (ref 0.0–0.1)
Basophils Relative: 1 %
Eosinophils Absolute: 0.1 10*3/uL (ref 0.0–0.5)
Eosinophils Relative: 2 %
HCT: 27.5 % — ABNORMAL LOW (ref 36.0–46.0)
Hemoglobin: 9.1 g/dL — ABNORMAL LOW (ref 12.0–15.0)
Immature Granulocytes: 1 %
Lymphocytes Relative: 3 %
Lymphs Abs: 0.1 10*3/uL — ABNORMAL LOW (ref 0.7–4.0)
MCH: 38.1 pg — ABNORMAL HIGH (ref 26.0–34.0)
MCHC: 33.1 g/dL (ref 30.0–36.0)
MCV: 115.1 fL — ABNORMAL HIGH (ref 80.0–100.0)
Monocytes Absolute: 0.5 10*3/uL (ref 0.1–1.0)
Monocytes Relative: 15 %
Neutro Abs: 2.5 10*3/uL (ref 1.7–7.7)
Neutrophils Relative %: 78 %
Platelet Count: 167 10*3/uL (ref 150–400)
RBC: 2.39 MIL/uL — ABNORMAL LOW (ref 3.87–5.11)
RDW: 16.2 % — ABNORMAL HIGH (ref 11.5–15.5)
WBC Count: 3.2 10*3/uL — ABNORMAL LOW (ref 4.0–10.5)
nRBC: 0 % (ref 0.0–0.2)

## 2020-01-06 LAB — CMP (CANCER CENTER ONLY)
ALT: 13 U/L (ref 0–44)
AST: 16 U/L (ref 15–41)
Albumin: 3.9 g/dL (ref 3.5–5.0)
Alkaline Phosphatase: 53 U/L (ref 38–126)
Anion gap: 9 (ref 5–15)
BUN: 17 mg/dL (ref 8–23)
CO2: 20 mmol/L — ABNORMAL LOW (ref 22–32)
Calcium: 9 mg/dL (ref 8.9–10.3)
Chloride: 109 mmol/L (ref 98–111)
Creatinine: 0.81 mg/dL (ref 0.44–1.00)
GFR, Est AFR Am: >60 mL/min (ref >60)
GFR, Estimated: >60 mL/min (ref >60)
Glucose, Bld: 98 mg/dL (ref 70–99)
Potassium: 4 mmol/L (ref 3.5–5.1)
Sodium: 138 mmol/L (ref 135–145)
Total Bilirubin: 0.8 mg/dL (ref 0.3–1.2)
Total Protein: 5.9 g/dL — ABNORMAL LOW (ref 6.5–8.1)

## 2020-01-06 MED ORDER — DEXAMETHASONE 4 MG PO TABS
ORAL_TABLET | ORAL | Status: AC
  Filled 2020-01-06: qty 5

## 2020-01-06 MED ORDER — SODIUM CHLORIDE 0.9 % IV SOLN
Freq: Once | INTRAVENOUS | Status: DC
  Filled 2020-01-06: qty 250

## 2020-01-06 MED ORDER — DEXAMETHASONE 4 MG PO TABS
20.0000 mg | ORAL_TABLET | Freq: Once | ORAL | Status: AC
  Administered 2020-01-06: 20 mg via ORAL

## 2020-01-06 MED ORDER — DEXTROSE 5 % IV SOLN
69.0000 mg/m2 | Freq: Once | INTRAVENOUS | Status: AC
  Administered 2020-01-06: 110 mg via INTRAVENOUS
  Filled 2020-01-06: qty 30

## 2020-01-06 MED ORDER — SODIUM CHLORIDE 0.9 % IV SOLN
Freq: Once | INTRAVENOUS | Status: AC
  Filled 2020-01-06: qty 250

## 2020-01-06 MED ORDER — PALONOSETRON HCL INJECTION 0.25 MG/5ML
0.2500 mg | Freq: Once | INTRAVENOUS | Status: AC
  Administered 2020-01-06: 0.25 mg via INTRAVENOUS

## 2020-01-06 MED ORDER — SODIUM CHLORIDE 0.9% FLUSH
10.0000 mL | INTRAVENOUS | Status: DC | PRN
  Filled 2020-01-06: qty 10

## 2020-01-06 MED ORDER — HEPARIN SOD (PORK) LOCK FLUSH 100 UNIT/ML IV SOLN
500.0000 [IU] | Freq: Once | INTRAVENOUS | Status: DC | PRN
  Filled 2020-01-06: qty 5

## 2020-01-06 MED ORDER — PALONOSETRON HCL INJECTION 0.25 MG/5ML
INTRAVENOUS | Status: AC
  Filled 2020-01-06: qty 5

## 2020-01-06 NOTE — Patient Instructions (Signed)
[facility] Discharge Instructions for Patients Receiving Chemotherapy  Today you received the following chemotherapy agents: Carfilzomib (Kyprolis)  To help prevent nausea and vomiting after your treatment, we encourage you to take your nausea medication as directed by your provider   If you develop nausea and vomiting that is not controlled by your nausea medication, call the clinic.   BELOW ARE SYMPTOMS THAT SHOULD BE REPORTED IMMEDIATELY:  *FEVER GREATER THAN 100.5 F  *CHILLS WITH OR WITHOUT FEVER  NAUSEA AND VOMITING THAT IS NOT CONTROLLED WITH YOUR NAUSEA MEDICATION  *UNUSUAL SHORTNESS OF BREATH  *UNUSUAL BRUISING OR BLEEDING  TENDERNESS IN MOUTH AND THROAT WITH OR WITHOUT PRESENCE OF ULCERS  *URINARY PROBLEMS  *BOWEL PROBLEMS  UNUSUAL RASH Items with * indicate a potential emergency and should be followed up as soon as possible.  Feel free to call the clinic should you have any questions or concerns. The clinic phone number is [phone].  Please show the CHEMO ALERT CARD at check-in to the Emergency Department and triage nurse.   

## 2020-01-06 NOTE — Progress Notes (Signed)
[facility] Follow up:    Shannon Resides, MD [place] [address]   DIAGNOSIS: Cancer Staging Multiple myeloma [facility]) Staging form: Multiple Myeloma, AJCC 6th Edition - Clinical: Stage IIIA - Signed by [name], MD on 09/04/2013   SUMMARY OF ONCOLOGIC HISTORY: Oncology History Overview Note  Multiple myeloma ([place])   Staging form: Multiple Myeloma, AJCC 6th Edition   - Clinical: Stage IIIA - Signed by [name], MD on 09/04/2013    Multiple myeloma ([place])  05/26/2012 Initial Diagnosis   Presenting IgG was 4,040 mg/dL on 06/05/2012 (IgA 35; IgM 34); kappa free light chain 34.4 mg/dL, lambda 0.00, kappa:lambda ratio of 34.75; SPEP with M-spike of 2.60.   06/30/2012 Imaging   Numerous lytic lesions throughout the calvarium.  Lytic lesion within the left lateral scapula.  Findings most compatible with metastases or myeloma. Multiple mid and lower thoracic compression fractures.  Slight compression through the endplates at L3.   82/42/3536 Bone Marrow Biopsy   Bone marrow biopsy showed 49% plasma cell. Normal classical cytogenetics; however, myeloma FISH panel showed 13q- (intermediate risk)       07/14/2012 - 07/27/2012 Radiation Therapy   Palliative radiation 20 Gy over 10 fractions between to thoracic spine cord compression and symptomatic left scapula lytic lesion.   08/10/2012 - 11/2012 Chemotherapy   Started SQ Velcade once weekly, 3 weeks on, 1 weeks off; daily Revlimid d1-21, 7 days off; and Dexamethasone 31m PO weekly.    02/12/2013 Bone Marrow Transplant   [facility]    06/14/2013 Tumor Marker   IgG  1830,  Kappa:lamba ratio, 1.69 (baseline was 34.75 or   M-spike 0.28 (baseline of 2.6 or  89.3% of baseline).     06/25/2013 -  Chemotherapy   Started zometa 3.5 mg monthly    09/01/2013 - 01/2014 Chemotherapy   Maintenance therapy with revlimid 519mdaily for 14 days and then off for 7 (decreased from  21 days on and 7 days off based on neutropenia). Stopped due to disease progression 01/2017   09/13/2013 - 09/17/2014 Hospital Admission   Hospital admission for pneumonia   12/20/2013 Treatment Plan Change   Maintenance therapy decreased to 2.5 mg daily for 21 days and then off for 7 days based on low counts.    01/25/2014 Tumor Marker   IgG 1360 M spike 0.5   03/01/2014 - 03/05/2014 Hospital Ad[facility] received thromb[facility] therapy, cath showed 60% prox RCA, mid-distal RCA 50%, Echo normal, no stents.   04/11/2014 - 08/07/2014 Chemotherapy   Continue Revlimid at 2.5 mg 3 weeks on 1 week off   08/08/2014 - 08/13/2014 Hospital Admission   Hospital admission for pneumonia. Her Revlimid was held.   08/29/2014 - 09/14/2014 Chemotherapy   Maintenance Revlimid restarted   09/14/2014 - 09/17/2014 Hospital Admission   [place].   11/13/2014 - 12/25/2015 Chemotherapy   Maintenance Revlimid restarted, changed to 2.47m57maily, 2 weks on, 1 week off from 12/15/2014, stopped due to disease progression    01/24/2016 Progression   Patient is M protein has gradually increased to 1.0g, repeated a bone marrow biopsy showed plasma cell 10-30%   02/27/2016 - 03/07/2016 Chemotherapy   Pomalidomide 4 mg daily on day 1-21, dexamethasone 40 mg on day 1, 8 and 15, every 28 days, started  on 02/27/2016, daratumumab weekly started on 03/07/2016, held after first dose, due to hospitalization and a severe pancytopenia, pomalidomide was stopped afterwards    03/10/2016 - 03/29/2016 Hospital Admission   Pt was admitted for sepsis from pneumonia, and severe pancytopenia. She required ICU stay for a few days due to hypotension, developed b/l pleural effusion required diuretics and b/l thoracentesis. She also required blood and plt transfusion, and prolonged neupogen injection for severe  neutropenia. She was discharged home after 3 weeks hospital stay    04/26/2016 - 04/26/2019 Chemotherapy   Daratumumab restarted on 04/26/2016, Velcade 1.66m/m2 and dexa 254mweekly start on 11/3, velcade held since 06/28/2016 due to CHF, dexa stopped on12/11/2017 due to high risk of fracture.  Dara reduced to every 4 weeks due to COVID-19 starting 10/23/18. Returned to every 3 weeks starting 12/17/18. Restarted every 2 weeks starting 02/22/19 Stopped on 04/26/19 due to disease progression.    08/26/2016 Echocardiogram   - Left ventricle: LVEF is approximately 35% with diffuse diffuse   hypokinesis with severe hypokinesis/ akinesis of the   inferior/inferoseptal walls. In comparison to echo images from   December 2017, there does not appear to be significant change The   cavity size was normal. Wall thickness was normal. Doppler   parameters are consistent with abnormal left ventricular   relaxation (grade 1 diastolic dysfunction). - Aortic valve: AV is thickened. 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 Present in echo from December 2017 There was   trivial regurgitation. - Right ventricle: Systolic function was mildly reduced. - Right atrium: The atrium was mildly dilated. - Pericardium, extracardiac: A trivial pericardial effusion was   identified.   12/03/2016 Echocardiogram   -Left Ventricle: Systolic function was   mildly to moderately reduced. The estimated ejection fraction was   in the range of 40% to 45%. Diffuse hypokinesis. Doppler   parameters are consistent with abnormal left ventricular   relaxation (grade 1 diastolic dysfunction). Doppler parameters   are consistent with indeterminate ventricular filling pressure. - Left atrium: The atrium was severely dilated. - Tricuspid valve: There was trivial regurgitation. - Pulmonary arteries: Systolic pressure was within the normal   range. PA peak pressure: 33 mm Hg  (S). - Global longitudinal strain -10.5% (abnormal)   03/12/2017 Echocardiogram   ECHO 03/12/17 Study Conclusions - Left ventricle: The cavity size was normal. Wall thickness was   increased in a pattern of mild LVH. Systolic function was normal.   The estimated ejection fraction was in the range of 50% to 55%.   There is hypokinesis of the basalinferior myocardium. Doppler   parameters are consistent with abnormal left ventricular   relaxation (grade 1 diastolic dysfunction). - Aortic valve: There was trivial regurgitation. - Mitral valve: Calcified annulus. Mildly thickened leaflets .   There was trivial regurgitation. - Tricuspid valve: There was mild regurgitation. Impressions: - There has been mild improvement in EF since prior study.    12/12/2017 - 12/15/2017 Hospital Admission   Admission diagnosis: Pnuemonia and acute hypoxic respiratory failure Additional comments: treated for pneumoania and acute hypoxic respiratory failure before she was discharged on 12/15/17 with oral anitbiotics. She did not require home oxygen.    12/30/2017 - 01/02/2018 Hospital Admission   Admission diagnosis: Pnuemonia  Additional comments: She was again hospitalized on 12/30/17 for pneumonia, ID work up was otherwise negative, she was discharged home with oral antibiotics.    04/01/2018 Imaging   04/01/2018 CXR  IMPRESSION: Persisting airspace disease superimposed on chronic changes, suggesting pneumonia given the history. Followup PA and lateral chest X-ray is recommended in 3-4 weeks following therapy to ensure resolution and exclude underlying malignancy.  Redemonstration of mid and lower thoracic compression fractures with associated thoracic kyphosis.   04/23/2018 Echocardiogram   04/23/2018 ECHO LV EF: 45% -   50%   01/07/2019 Imaging   MRI Brain 01/07/19 IMPRESSION: MRI HEAD IMPRESSION:   1. 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 Lesion closely approximates the left inner ear structures and involves the left Meckel's cave and left fifth cranial nerve, and most likely is the causative etiology for patient's underlying left ear and facial symptoms. Follow-up examination with postcontrast imaging suggested for complete evaluation. Inclusion of IAC sequences would likely be helpful for visualization. 2. Additional 2.7 cm lesion involving the right occipital calvarium with adjacent marrow signal abnormality, also likely related to multiple myeloma. Metastatic disease would be the primary differential consideration. 3. Underlying age-appropriate cerebral atrophy with mild chronic microvascular ischemic disease. No other acute intracranial abnormality.   MRA HEAD IMPRESSION:   1. Negative intracranial MRA with no large vessel occlusion, hemodynamically significant stenosis, or other acute vascular abnormality. 2. Petrous and cavernous left ICA is partially encased and surrounded by the left petrous apex lesion without significant irregularity or stenosis. 3. 2 mm right paraophthalmic aneurysm.   MRA NECK IMPRESSION:   Normal MRA of the neck with wide patency of the carotid and vertebral arteries bilaterally. No hemodynamically significant stenosis or other acute vascular abnormality identified.    01/14/2019 - 01/27/2019 Radiation Therapy   By Dr. Tammi Klippel.    Diagnosis:   79 yo woman with skull base myeloma lesion causing left ear pain      Indication for treatment:  Palliative        Radiation treatment dates:   01/14/2019 - 01/27/2019   Site/dose:   Skull Base / 20 Gy in 10 fractions   Beams/energy:   3D, photons / 10X, 6X   04/01/2019 Pathology Results   Bone Marrow biopsy 04/01/19  DIAGNOSIS: BONE MARROW, ASPIRATE, CLOT, CORE: - Normocellular marrow with minimal involvement by plasma cell neoplasm - See  comment PERIPHERAL BLOOD: - Macrocytic anemia and leukopenia -See complete blood count   04/09/2019 PET scan   PET 04/09/19  IMPRESSION: 1. Scattered lytic lesions of bone. A few of these demonstrate hypermetabolic activity, most notably the right occipital bone lesion; the lytic lesion in the approximately C7 spinous process; the lytic expansile lesion of the left fourth rib anteriorly; and a small lytic focus in the right femoral head. Other scattered lytic lesions have only low-grade activity. 2. Multiple fractures as detailed above. 3. Other imaging findings of potential clinical significance: Aortic Atherosclerosis (ICD10-I70.0). Coronary atherosclerosis.   05/10/2019 -  Chemotherapy   -CyBorD (cyclophosphamide (CYTOXAN) 526m (30437mm2), Velcade 1.37m34m2, and dexa 32m75meekly) starting 05/24/19.   -Due to neuropathy after 2 doses of Velcade, I switched to weekly Kyprolis 3 weeks on/1 week off on 06/14/19.   -Chemo held for RT 07/05/19-07/22/19.   -Dexa stopped and Cytoxan dose reduced to 400 mg on day 1, 8 and 15 every 28 days, from cycle 3.     07/05/2019 - 07/20/2019 Radiation Therapy   By Dr. MannTammi Klippeliagnosis:   78 y43woman with painful right occipital and left hip involvement by multiple  myeloma      Indication for treatment:  Palliation        Radiation treatment dates:   07/05/19-07/20/19   Site/dose:    1.  The Right Occipital skull lesion was treated to 20 Gy in 20 fractions of 2 Gy 2.  The Left Hip lesion was treated to 20 Gy in 20 fractions of 2 Gy   Beams/energy:      1.  The Right Occipital skull lesion was treated using a 3-field 3D set-up 2.  The Left Hip lesion was treated anterior and posterior fields.     CURRENT THERAPY: Carfilzomib/cytoxan/dex  INTERVAL HISTORY: Shannon Rush 79 y.o. female returns for evaluation of her multiple myeloma.  She is receiving treatment for this with the above.  She notes that the first couple of days after chemotherapy  she is fatigued.  She has mild nausea and Compazine helps with this.  She hasn't vomited.  Her appetite has been limited.  She endorses taste changes.  She is eating ice cream.  Her weight is up one pound since her last visit.    She is exercising at the [facility] and is not having any pain.  She does have some mild peripheral neuropathy in her fingers and toes, but says that this is stable and unchanged as compared to prior.  She is overall feeling well.     Patient Active Problem List   Diagnosis Date Noted  . TIA (transient ischemic attack) 12/22/2018  . Vertigo 11/24/2018  . Hammer toe of right foot 11/24/2018  . Delayed wound healing 11/24/2018  . Acute bronchitis 08/29/2018  . Closed fracture of multiple pubic rami ([place]) 01/27/2018  . Closed fracture of multiple pubic rami, initial encounter ([place]) 01/27/2018  . Port-A-Cath in place 11/21/2017  . Goals of care, counseling/discussion 02/28/2017  . Non-ischemic cardiomyopathy ([place]) 12/04/2016  . Antineoplastic chemotherapy induced anemia 03/28/2016  . Diastolic dysfunction-grade 2 03/27/2016  . Patient on antineoplastic chemotherapy regimen   . Adrenocortical insufficiency ([place])   . Drug-induced neutropenia ([place]) 03/11/2016  . HCAP (healthcare-associated pneumonia) 03/10/2016  . Corn of foot 11/04/2014  . Protein-calorie malnutrition ([place]) 09/23/2014  . Pancytopenia ([place]) 08/10/2014  . CAD -50-60% RCA Aug 2015 03/11/2014  . History of STEMI-Aug 2015- secondary to thrombotic state from chemo 03/10/2014  . Unspecified vitamin D deficiency 05/05/2013  . History of peripheral stem cell transplant ([place]) 03/05/2013  . History of organ or tissue transplant 03/05/2013  . Swelling, mass, or lump in chest 12/24/2012  . Multiple myeloma ([place]) 07/01/2012  . Sciatica of left side 06/03/2011  . Osteoporosis 06/11/2010  . Anemia, macrocytic 06/02/2009  . DJD, UNSPECIFIED 09/11/2006    is allergic to zithromax [azithromycin], zosyn [piperacillin  sod-tazobactam so], and quinolones.  MEDICAL HISTORY: Past Medical History:  Diagnosis Date  . CAD (coronary artery disease)    a. inf STEMI (in [place] >>> lytics) >>> LHC (8/15- [place]):  pRCA 60%, mid to dist RCA 50% >>> med rx  . Cardiomyopathy ([place])   . Cataract   . Chronic combined systolic and diastolic CHF (congestive heart failure) ([place])    cancer medication related   . Cord compression ([place]) 07/13/12   MRI- diffuse myeloma involvement of T-L spine  . History of radiation therapy 07/13/12-07/27/12   spinal cord compression T3-T10,left scapula  . 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 Multiple myeloma ([place]) 07/01/2012  . MVP (mitral valve prolapse)    a. trivial by  echo 06/2017.  . OSTEOPOROSIS 06/11/2010   Multiple compression fractures; and spontaneous fracture of sternum Qualifier: Diagnosis of  By: [name], [name]     . Thoracic kyphosis 07/13/12   per MRI scan  . Unspecified deficiency anemia     SURGICAL HISTORY: Past Surgical History:  Procedure Laterality Date  . APPENDECTOMY    . CATARACT EXTRACTION, BILATERAL    . CESAREAN SECTION     x2   . COLONOSCOPY  2007   neg with Dr. [name]  . ELBOW SURGERY    . HIP SURGERY  2009   left  . TUBAL LIGATION      SOCIAL HISTORY: Social History   Socioeconomic History  . Marital status: Married    Spouse name: Fritz Pickerel  . Number of children: 2  . Years of education: Not on file  . Highest education level: Doctorate  Occupational History  . Occupation: Museum/gallery conservator: [employer]  Tobacco Use  . Smoking status: Never Smoker  . Smokeless tobacco: Never Used  Vaping Use  . Vaping Use: Never used  Substance and Sexual Activity  . Alcohol use: Yes    Alcohol/week: 7.0 standard drinks    Types: 7 Standard drinks or equivalent per week    Comment: 1 glass of wine or beer 3-5 days per week  . Drug use: No  . Sexual activity: Yes    Birth control/protection:  Post-menopausal  Other Topics Concern  . Not on file  Social History Narrative   Who lives with you: spouse, 2 story home, does not have problems with the stairs, has walk in shower, handrails.   Any pets: Dog, Brayden, black lab/boxer mix   Diet: Patient has a varied diet of protein, vegetables, starches.  Limits red meats.   Exercise: Patient exercises at least 3 times a week for over an hour.   Seatbelts: Patient reports wearing her seatbelt when in vehicle.    Nancy Fetter Exposure/Protection: Patient reports wearing sunscreen daily.   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 Biked 5 miles this morning to this appointment.   Social Determinants of Health   Financial Resource Strain:   . Difficulty of Paying Living Expenses:   Food Insecurity:   . Worried About Charity fundraiser in the Last Year:   . Arboriculturist in the Last Year:   Transportation Needs:   . Film/video editor (Medical):   [place] Kitchen  Lack of Transportation (Non-Medical):   Physical Activity:   . Days of Exercise per Week:   . Minutes of Exercise per Session:   Stress:   . Feeling of Stress :   Social Connections:   . Frequency of Communication with Friends and Family:   . Frequency of Social Gatherings with Friends and Family:   . Attends Religious Services:   . Active Member of Clubs or Organizations:   . Attends Archivist Meetings:   Mar[place] Kitchen Marital  Status:   Intimate Partner Violence:   . Fear of Current or Ex-Partner:   . Emotionally Abused:   [place] Kitchen Physically Abused:   . Sexually Abused:     FAMILY HISTORY: Family History  Problem Relation Age of Onset  . Glaucoma Mother   . Heart disease Mother   . Peripheral vascular disease Mother   . Raynaud syndrome Mother   . Heart disease Father   . Heart failure Father   . Glaucoma Father   . Cancer Brother        stage IV prostate CA  . Prostate cancer Brother   . Raynaud syndrome Daughter   . Raynaud syndrome Daughter   . Breast cancer Neg Hx   . Colon cancer Neg Hx     Review of Systems  Constitutional: Positive for appetite change and fatigue. Negative for chills, diaphoresis, fever and unexpected weight change.  HENT:   Negative for hearing loss and lump/mass.   Eyes: Negative for eye problems and icterus.  Respiratory: Negative for chest tightness, cough and shortness of breath.   Cardiovascular: Negative for chest pain, leg swelling and palpitations.  Gastrointestinal: Positive for nausea. Negative for abdominal distention, abdominal pain, constipation, diarrhea and vomiting.  Endocrine: Negative for hot flashes.  Genitourinary: Negative for difficulty urinating.   Musculoskeletal: Positive for arthralgias (knee pain).  Neurological: Negative for dizziness, extremity weakness, headaches and numbness.  Hematological: Negative for adenopathy. Does not bruise/bleed easily.  Psychiatric/Behavioral: Negative for depression. The patient is not nervous/anxious.       PHYSICAL EXAMINATION  ECOG PERFORMANCE STATUS: 1 - Symptomatic but completely ambulatory  Vitals:   01/06/20 1340  BP: 102/62  Pulse: 81  Resp: 16  Temp: 98.9 F (37.2 C)  SpO2: 100%    Physical Exam Constitutional:      General: She is not in acute distress.    Appearance: Normal appearance. She is not toxic-appearing.  HENT:     Head: Normocephalic and atraumatic.  Eyes:     General: No  scleral icterus. Cardiovascular:     Rate and Rhythm: Normal rate and regular rhythm.     Pulses: Normal pulses.     Heart sounds: Normal heart sounds.  Pulmonary:     Effort: Pulmonary effort is normal.     Breath sounds: Normal breath sounds.  Abdominal:     General: Abdomen is flat. Bowel sounds are normal.     Palpations: Abdomen is soft.  Musculoskeletal:        General: Deformity (upper thoracic spinal kyphosis noted) present.     Cervical back: Neck supple.  Lymphadenopathy:     Cervical: No cervical adenopathy.  Skin:    General: Skin is warm and dry.     Capillary Refill: Capillary refill takes less than 2 seconds.  Neurological:     General: No focal deficit present.     Mental Status: She is alert.  Psychiatric:        Mood and Affect: Mood  normal.        Behavior: Behavior normal.     LABORATORY DATA:  CBC    Component Value Date/Time   WBC 3.2 (L) 01/06/2020 1307   WBC 4.5 01/27/2018 0610   RBC 2.39 (L) 01/06/2020 1307   HGB 9.1 (L) 01/06/2020 1307   HGB 11.2 (L) 07/18/2017 0808   HCT 27.5 (L) 01/06/2020 1307   HCT 33.9 (L) 07/18/2017 0808   PLT 167 01/06/2020 1307   PLT 170 07/18/2017 0808   MCV 115.1 (H) 01/06/2020 1307   MCV 108.3 (H) 07/18/2017 0808   MCH 38.1 (H) 01/06/2020 1307   MCHC 33.1 01/06/2020 1307   RDW 16.2 (H) 01/06/2020 1307   RDW 14.8 (H) 07/18/2017 0808   LYMPHSABS 0.1 (L) 01/06/2020 1307   LYMPHSABS 0.3 (L) 07/18/2017 0808   MONOABS 0.5 01/06/2020 1307   MONOABS 0.3 07/18/2017 0808   EOSABS 0.1 01/06/2020 1307   EOSABS 0.1 07/18/2017 0808   BASOSABS 0.0 01/06/2020 1307   BASOSABS 0.0 07/18/2017 0808    CMP     Component Value Date/Time   NA 138 01/06/2020 1307   NA 140 07/18/2017 0808   K 4.0 01/06/2020 1307   K 3.9 07/18/2017 0808   CL 109 01/06/2020 1307   CL 106 11/26/2012 0936   CO2 20 (L) 01/06/2020 1307   CO2 27 07/18/2017 0808   GLUCOSE 98 01/06/2020 1307   GLUCOSE 68 (L) 07/18/2017 0808   GLUCOSE 77  11/26/2012 0936   BUN 17 01/06/2020 1307   BUN 15.1 07/18/2017 0808   CREATININE 0.81 01/06/2020 1307   CREATININE 0.8 07/18/2017 0808   CALCIUM 9.0 01/06/2020 1307   CALCIUM 9.0 07/18/2017 0808   PROT 5.9 (L) 01/06/2020 1307   PROT 5.8 (L) 07/18/2017 0808   ALBUMIN 3.9 01/06/2020 1307   ALBUMIN 3.7 07/18/2017 0808   AST 16 01/06/2020 1307   AST 19 07/18/2017 0808   ALT 13 01/06/2020 1307   ALT 17 07/18/2017 0808   ALKPHOS 53 01/06/2020 1307   ALKPHOS 51 07/18/2017 0808   BILITOT 0.8 01/06/2020 1307   BILITOT 0.50 07/18/2017 0808   GFRNONAA >60 01/06/2020 1307   GFRNONAA 67 05/08/2012 0922   GFRAA >60 01/06/2020 1307   GFRAA 78 05/08/2012 0922         ASSESSMENT and THERAPY PLAN:   Multiple myeloma ([place]) Wilfred Curtis is a 79 year old woman with multiple myeloma on treatment with Carfilzomib, Cytoxan, and Dexamethasone.  She is tolerating these therapies well and her labs remain stable.  She will proceed with treatment today.    Her neuropathy is stable today.  We will need to monitor this closely.    Dalicia is very active and healthy, which is great.  I encouraged her to continue to do so.    Tariyah will return on 01/20/2020 for labs, f/u with Cira Rue, and her next treatment.     All questions were answered. The patient knows to call the clinic with any problems, questions or concerns. We can certainly see the patient much sooner if necessary.  Total encounter time: 20 minutes*  Wilber Bihari, NP 01/08/20 5:06 AM Medical Oncology and Hematology [facility] [place], [place] [address] Tel. [phone]    Fax. [phone]  *Total Encounter Time as defined by the Centers for Medicare and Medicaid Services includes, in addition to the face-to-face time of a patient visit (documented in the note above) non-face-to-face time: obtaining and reviewing outside  history, ordering and reviewing medications, tests or procedures, care coordination  (communications with other health care professionals or caregivers) and documentation in the medical record.

## 2020-01-07 ENCOUNTER — Telehealth: Payer: Self-pay | Admitting: Adult Health

## 2020-01-07 LAB — KAPPA/LAMBDA LIGHT CHAINS
Kappa free light chain: 28.1 mg/L — ABNORMAL HIGH (ref 3.3–19.4)
Kappa, lambda light chain ratio: >18.73 — ABNORMAL HIGH (ref 0.26–1.65)
Lambda free light chains: <1.5 mg/L — ABNORMAL LOW (ref 5.7–26.3)

## 2020-01-07 NOTE — Telephone Encounter (Signed)
No 6/25 los. No changes made to pt's schedule.  

## 2020-01-08 NOTE — Assessment & Plan Note (Signed)
Shannon Rush is a 79 year old woman with multiple myeloma on treatment with Carfilzomib, Cytoxan, and Dexamethasone.  She is tolerating these therapies well and her labs remain stable.  She will proceed with treatment today.    Her neuropathy is stable today.  We will need to monitor this closely.    Shannon Rush is very active and healthy, which is great.  I encouraged her to continue to do so.    Shannon Rush will return on 01/20/2020 for labs, f/u with Cira Rue, and her next treatment.

## 2020-01-10 LAB — MULTIPLE MYELOMA PANEL, SERUM
Albumin SerPl Elph-Mcnc: 3.7 g/dL (ref 2.9–4.4)
Albumin/Glob SerPl: 1.9 — ABNORMAL HIGH (ref 0.7–1.7)
Alpha 1: 0.3 g/dL (ref 0.0–0.4)
Alpha2 Glob SerPl Elph-Mcnc: 0.6 g/dL (ref 0.4–1.0)
B-Globulin SerPl Elph-Mcnc: 0.8 g/dL (ref 0.7–1.3)
Gamma Glob SerPl Elph-Mcnc: 0.4 g/dL (ref 0.4–1.8)
Globulin, Total: 2 g/dL — ABNORMAL LOW (ref 2.2–3.9)
IgA: <5 mg/dL — ABNORMAL LOW (ref 64–422)
IgG (Immunoglobin G), Serum: 350 mg/dL — ABNORMAL LOW (ref 586–1602)
IgM (Immunoglobulin M), Srm: <5 mg/dL — ABNORMAL LOW (ref 26–217)
M Protein SerPl Elph-Mcnc: 0.1 g/dL — ABNORMAL HIGH
Total Protein ELP: 5.7 g/dL — ABNORMAL LOW (ref 6.0–8.5)

## 2020-01-17 MED FILL — CYCLOPHOSPHAMIDE 50 MG CAPS: 50 | 28 days supply | Qty: 12 | Fill #1

## 2020-01-20 ENCOUNTER — Inpatient Hospital Stay: Payer: Medicare Other | Attending: Medical

## 2020-01-20 ENCOUNTER — Encounter: Payer: Self-pay | Admitting: Nurse Practitioner

## 2020-01-20 ENCOUNTER — Inpatient Hospital Stay: Payer: Medicare Other | Admitting: Nurse Practitioner

## 2020-01-20 ENCOUNTER — Inpatient Hospital Stay: Payer: Medicare Other

## 2020-01-20 ENCOUNTER — Other Ambulatory Visit: Payer: Self-pay

## 2020-01-20 VITALS — BP 144/79 | HR 78 | Temp 97.3°F | Resp 18 | Ht 66.5 in | Wt 114.8 lb

## 2020-01-20 DIAGNOSIS — Z9221 Personal history of antineoplastic chemotherapy: Secondary | ICD-10-CM | POA: Insufficient documentation

## 2020-01-20 DIAGNOSIS — C9002 Multiple myeloma in relapse: Secondary | ICD-10-CM

## 2020-01-20 DIAGNOSIS — G629 Polyneuropathy, unspecified: Secondary | ICD-10-CM | POA: Insufficient documentation

## 2020-01-20 DIAGNOSIS — Z79899 Other long term (current) drug therapy: Secondary | ICD-10-CM | POA: Diagnosis not present

## 2020-01-20 DIAGNOSIS — C9000 Multiple myeloma not having achieved remission: Secondary | ICD-10-CM

## 2020-01-20 DIAGNOSIS — I2510 Atherosclerotic heart disease of native coronary artery without angina pectoris: Secondary | ICD-10-CM | POA: Diagnosis not present

## 2020-01-20 DIAGNOSIS — E785 Hyperlipidemia, unspecified: Secondary | ICD-10-CM | POA: Insufficient documentation

## 2020-01-20 DIAGNOSIS — I252 Old myocardial infarction: Secondary | ICD-10-CM | POA: Diagnosis not present

## 2020-01-20 DIAGNOSIS — Z5112 Encounter for antineoplastic immunotherapy: Secondary | ICD-10-CM | POA: Diagnosis not present

## 2020-01-20 DIAGNOSIS — Z7982 Long term (current) use of aspirin: Secondary | ICD-10-CM | POA: Diagnosis not present

## 2020-01-20 DIAGNOSIS — Z923 Personal history of irradiation: Secondary | ICD-10-CM | POA: Insufficient documentation

## 2020-01-20 DIAGNOSIS — Z9481 Bone marrow transplant status: Secondary | ICD-10-CM | POA: Diagnosis not present

## 2020-01-20 LAB — CMP (CANCER CENTER ONLY)
ALT: 16 U/L (ref 0–44)
AST: 20 U/L (ref 15–41)
Albumin: 4.1 g/dL (ref 3.5–5.0)
Alkaline Phosphatase: 50 U/L (ref 38–126)
Anion gap: 11 (ref 5–15)
BUN: 18 mg/dL (ref 8–23)
CO2: 20 mmol/L — ABNORMAL LOW (ref 22–32)
Calcium: 9.2 mg/dL (ref 8.9–10.3)
Chloride: 111 mmol/L (ref 98–111)
Creatinine: 0.79 mg/dL (ref 0.44–1.00)
GFR, Est AFR Am: >60 mL/min (ref >60)
GFR, Estimated: >60 mL/min (ref >60)
Glucose, Bld: 86 mg/dL (ref 70–99)
Potassium: 4.1 mmol/L (ref 3.5–5.1)
Sodium: 142 mmol/L (ref 135–145)
Total Bilirubin: 1.1 mg/dL (ref 0.3–1.2)
Total Protein: 6.4 g/dL — ABNORMAL LOW (ref 6.5–8.1)

## 2020-01-20 LAB — CBC WITH DIFFERENTIAL (CANCER CENTER ONLY)
Abs Immature Granulocytes: 0.02 10*3/uL (ref 0.00–0.07)
Basophils Absolute: 0 10*3/uL (ref 0.0–0.1)
Basophils Relative: 1 %
Eosinophils Absolute: 0 10*3/uL (ref 0.0–0.5)
Eosinophils Relative: 1 %
HCT: 27.1 % — ABNORMAL LOW (ref 36.0–46.0)
Hemoglobin: 8.8 g/dL — ABNORMAL LOW (ref 12.0–15.0)
Immature Granulocytes: 1 %
Lymphocytes Relative: 3 %
Lymphs Abs: 0.1 10*3/uL — ABNORMAL LOW (ref 0.7–4.0)
MCH: 37.9 pg — ABNORMAL HIGH (ref 26.0–34.0)
MCHC: 32.5 g/dL (ref 30.0–36.0)
MCV: 116.8 fL — ABNORMAL HIGH (ref 80.0–100.0)
Monocytes Absolute: 0.4 10*3/uL (ref 0.1–1.0)
Monocytes Relative: 12 %
Neutro Abs: 2.5 10*3/uL (ref 1.7–7.7)
Neutrophils Relative %: 82 %
Platelet Count: 162 10*3/uL (ref 150–400)
RBC: 2.32 MIL/uL — ABNORMAL LOW (ref 3.87–5.11)
RDW: 16.3 % — ABNORMAL HIGH (ref 11.5–15.5)
WBC Count: 3.1 10*3/uL — ABNORMAL LOW (ref 4.0–10.5)
nRBC: 0 % (ref 0.0–0.2)

## 2020-01-20 LAB — CK TOTAL AND CKMB (NOT AT ARMC)
CK, MB: 3.3 ng/mL (ref 0.5–5.0)
Relative Index: INVALID (ref 0.0–2.5)
Total CK: 73 U/L (ref 38–234)

## 2020-01-20 MED ORDER — DEXAMETHASONE 4 MG PO TABS
ORAL_TABLET | ORAL | Status: AC
  Filled 2020-01-20: qty 5

## 2020-01-20 MED ORDER — DEXTROSE 5 % IV SOLN
69.0000 mg/m2 | Freq: Once | INTRAVENOUS | Status: AC
  Administered 2020-01-20: 110 mg via INTRAVENOUS
  Filled 2020-01-20: qty 30

## 2020-01-20 MED ORDER — SODIUM CHLORIDE 0.9 % IV SOLN
Freq: Once | INTRAVENOUS | Status: DC
  Filled 2020-01-20: qty 250

## 2020-01-20 MED ORDER — PALONOSETRON HCL INJECTION 0.25 MG/5ML
0.2500 mg | Freq: Once | INTRAVENOUS | Status: AC
  Administered 2020-01-20: 0.25 mg via INTRAVENOUS

## 2020-01-20 MED ORDER — PALONOSETRON HCL INJECTION 0.25 MG/5ML
INTRAVENOUS | Status: AC
  Filled 2020-01-20: qty 5

## 2020-01-20 MED ORDER — DEXAMETHASONE 4 MG PO TABS
20.0000 mg | ORAL_TABLET | Freq: Once | ORAL | Status: AC
  Administered 2020-01-20: 20 mg via ORAL

## 2020-01-20 MED ORDER — SODIUM CHLORIDE 0.9 % IV SOLN
Freq: Once | INTRAVENOUS | Status: AC
  Filled 2020-01-20: qty 250

## 2020-01-20 NOTE — Progress Notes (Signed)
[place]   [phone] Fax:([phone]) [phone]   Clinic Follow up Note   Patient Care Team: [name], MD as PCP - General (Family Medicine) [name], MD as PCP - Cardiology (Cardiology) [name], MD as Attending Physician (Obstetrics and Gynecology) [name], MD as Consulting Physician (Internal Medicine) [name], MD as Consulting Physician (Hematology) [name], MD as Consulting Physician (Ophthalmology) [name], MD as Consulting Physician (Cardiology) [name], MD as Consulting Physician (Dermatology) [name] (Dentistry) [name], DPM as Consulting Physician (Podiatry) Lazaro Arms, RN as [place] [facility] Lazaro Arms, RN as Case Manager 01/20/2020  CHIEF COMPLAINT: F/u MM  SUMMARY OF ONCOLOGIC HISTORY: Oncology History Overview Note  Multiple myeloma [facility])   Staging form: Multiple Myeloma, AJCC 6th Edition   - Clinical: Stage IIIA - Signed by [name], MD on 09/04/2013    Multiple myeloma ([place])  05/26/2012 Initial Diagnosis   Presenting IgG was 4,040 mg/dL on 06/05/2012 (IgA 35; IgM 34); kappa free light chain 34.4 mg/dL, lambda 0.00, kappa:lambda ratio of 34.75; SPEP with M-spike of 2.60.   06/30/2012 Imaging   Numerous lytic lesions throughout the calvarium.  Lytic lesion within the left lateral scapula.  Findings most compatible with metastases or myeloma. Multiple mid and lower thoracic compression fractures.  Slight compression through the endplates at L3.   83/15/1761 Bone Marrow Biopsy   Bone marrow biopsy showed 49% plasma cell. Normal classical cytogenetics; however, myeloma FISH panel showed 13q- (intermediate risk)       07/14/2012 - 07/27/2012 Radiation Therapy   Palliative radiation 20 Gy over 10 fractions between to thoracic spine cord compression and symptomatic left scapula lytic lesion.   08/10/2012 - 11/2012 Chemotherapy     Started SQ Velcade once weekly, 3 weeks on, 1 weeks off; daily Revlimid d1-21, 7 days off; and Dexamethasone 84m PO weekly.    02/12/2013 Bone Marrow Transplant   [facility]    06/14/2013 Tumor Marker   IgG  1830,  Kappa:lamba ratio, 1.69 (baseline was 34.75 or   M-spike 0.28 (baseline of 2.6 or  89.3% of baseline).     06/25/2013 -  Chemotherapy   Started zometa 3.5 mg monthly    09/01/2013 - 01/2014 Chemotherapy   Maintenance therapy with revlimid 576mdaily for 14 days and then off for 7 (decreased from 21 days on and 7 days off based on neutropenia). Stopped due to disease progression 01/2017   09/13/2013 - 09/17/2014 Hospital Admission   Hospital admission for pneumonia   12/20/2013 Treatment Plan Change   Maintenance therapy decreased to 2.5 mg daily for 21 days and then off for 7 days based on low counts.    01/25/2014 Tumor Marker   IgG 1360 M spike 0.5   03/01/2014 - 03/05/2014 Hospital Ad[facility]   [facility], received thrombolytic therapy, cath showed 60% prox RCA, mid-distal RCA 50%, Echo normal, no stents.   04/11/2014 - 08/07/2014 Chemotherapy   Continue Revlimid at 2.5 mg 3 weeks on 1 week off   08/08/2014 - 08/13/2014 Hospital Admission   Hospital admission for pneumonia. Her Revlimid was held.   08/29/2014 - 09/14/2014 Chemotherapy   Maintenance Revlimid restarted   09/14/2014 - 09/17/2014 Hospital Admission   [place].   11/13/2014 - 12/25/2015 Chemotherapy   Maintenance Revlimid restarted, changed  to 2.1m daily, 2 weks on, 1 week off from 12/15/2014, stopped due to disease progression    01/24/2016 Progression   Patient is M protein has gradually increased to 1.0g, repeated a bone marrow biopsy showed plasma cell 10-30%   02/27/2016 - 03/07/2016 Chemotherapy   Pomalidomide 4 mg daily on day 1-21, dexamethasone 40 mg on day  1, 8 and 15, every 28 days, started on 02/27/2016, daratumumab weekly started on 03/07/2016, held after first dose, due to hospitalization and a severe pancytopenia, pomalidomide was stopped afterwards    03/10/2016 - 03/29/2016 Hospital Admission   Pt was admitted for sepsis from pneumonia, and severe pancytopenia. She required ICU stay for a few days due to hypotension, developed b/l pleural effusion required diuretics and b/l thoracentesis. She also required blood and plt transfusion, and prolonged neupogen injection for severe neutropenia. She was discharged home after 3 weeks hospital stay    04/26/2016 - 04/26/2019 Chemotherapy   Daratumumab restarted on 04/26/2016, Velcade 1.352mm2 and dexa 2063meekly start on 11/3, velcade held since 06/28/2016 due to CHF, dexa stopped on12/11/2017 due to high risk of fracture.  Dara reduced to every 4 weeks due to COVID-19 starting 10/23/18. Returned to every 3 weeks starting 12/17/18. Restarted every 2 weeks starting 02/22/19 Stopped on 04/26/19 due to disease progression.    08/26/2016 Echocardiogram   - Left ventricle: LVEF is approximately 35% with diffuse diffuse   hypokinesis with severe hypokinesis/ akinesis of the   inferior/inferoseptal walls. In comparison to echo images from   December 2017, there does not appear to be significant change The   cavity size was normal. Wall thickness was normal. Doppler   parameters are consistent with abnormal left ventricular   relaxation (grade 1 diastolic dysfunction). - Aortic valve: AV is thickened. 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 Present in echo from December 2017 There was   trivial regurgitation. - Right ventricle: Systolic function was mildly reduced. - Right atrium: The atrium was mildly dilated. - Pericardium, extracardiac: A trivial pericardial effusion was   identified.   12/03/2016 Echocardiogram   -Left Ventricle: Systolic  function was   mildly to moderately reduced. The estimated ejection fraction was   in the range of 40% to 45%. Diffuse hypokinesis. Doppler   parameters are consistent with abnormal left ventricular   relaxation (grade 1 diastolic dysfunction). Doppler parameters   are consistent with indeterminate ventricular filling pressure. - Left atrium: The atrium was severely dilated. - Tricuspid valve: There was trivial regurgitation. - Pulmonary arteries: Systolic pressure was within the normal   range. PA peak pressure: 33 mm Hg (S). - Global longitudinal strain -10.5% (abnormal)   03/12/2017 Echocardiogram   ECHO 03/12/17 Study Conclusions - Left ventricle: The cavity size was normal. Wall thickness was   increased in a pattern of mild LVH. Systolic function was normal.   The estimated ejection fraction was in the range of 50% to 55%.   There is hypokinesis of the basalinferior myocardium. Doppler   parameters are consistent with abnormal left ventricular   relaxation (grade 1 diastolic dysfunction). - Aortic valve: There was trivial regurgitation. - Mitral valve: Calcified annulus. Mildly thickened leaflets .   There was trivial regurgitation. - Tricuspid valve: There was mild regurgitation. Impressions: - There has been mild improvement in EF since prior study.    12/12/2017 - 12/15/2017 Hospital Admission   Admission diagnosis: Pnuemonia and acute hypoxic respiratory failure Additional comments:  treated for pneumoania and acute hypoxic respiratory failure before she was discharged on 12/15/17 with oral anitbiotics. She did not require home oxygen.    12/30/2017 - 01/02/2018 Hospital Admission   Admission diagnosis: Pnuemonia  Additional comments: She was again hospitalized on 12/30/17 for pneumonia, ID work up was otherwise negative, she was discharged home with oral antibiotics.    04/01/2018 Imaging   04/01/2018 CXR IMPRESSION: Persisting airspace disease superimposed on chronic  changes, suggesting pneumonia given the history. Followup PA and lateral chest X-ray is recommended in 3-4 weeks following therapy to ensure resolution and exclude underlying malignancy.  Redemonstration of mid and lower thoracic compression fractures with associated thoracic kyphosis.   04/23/2018 Echocardiogram   04/23/2018 ECHO LV EF: 45% -   50%   01/07/2019 Imaging   MRI Brain 01/07/19 IMPRESSION: MRI HEAD IMPRESSION:   1. 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 Lesion closely approximates the left inner ear structures and involves the left Meckel's cave and left fifth cranial nerve, and most likely is the causative etiology for patient's underlying left ear and facial symptoms. Follow-up examination with postcontrast imaging suggested for complete evaluation. Inclusion of IAC sequences would likely be helpful for visualization. 2. Additional 2.7 cm lesion involving the right occipital calvarium with adjacent marrow signal abnormality, also likely related to multiple myeloma. Metastatic disease would be the primary differential consideration. 3. Underlying age-appropriate cerebral atrophy with mild chronic microvascular ischemic disease. No other acute intracranial abnormality.   MRA HEAD IMPRESSION:   1. Negative intracranial MRA with no large vessel occlusion, hemodynamically significant stenosis, or other acute vascular abnormality. 2. Petrous and cavernous left ICA is partially encased and surrounded by the left petrous apex lesion without significant irregularity or stenosis. 3. 2 mm right paraophthalmic aneurysm.   MRA NECK IMPRESSION:   Normal MRA of the neck with wide patency of the carotid and vertebral arteries bilaterally. No hemodynamically significant stenosis or other acute vascular abnormality identified.      01/14/2019 - 01/27/2019 Radiation Therapy   By Dr. Tammi Klippel.    Diagnosis:   79 yo woman with skull base myeloma lesion causing left ear pain      Indication for treatment:  Palliative        Radiation treatment dates:   01/14/2019 - 01/27/2019   Site/dose:   Skull Base / 20 Gy in 10 fractions   Beams/energy:   3D, photons / 10X, 6X   04/01/2019 Pathology Results   Bone Marrow biopsy 04/01/19  DIAGNOSIS: BONE MARROW, ASPIRATE, CLOT, CORE: - Normocellular marrow with minimal involvement by plasma cell neoplasm - See comment PERIPHERAL BLOOD: - Macrocytic anemia and leukopenia -See complete blood count   04/09/2019 PET scan   PET 04/09/19  IMPRESSION: 1. Scattered lytic lesions of bone. A few of these demonstrate hypermetabolic activity, most notably the right occipital bone lesion; the lytic lesion in the approximately C7 spinous process; the lytic expansile lesion of the left fourth rib anteriorly; and a small lytic focus in the right femoral head. Other scattered lytic lesions have only low-grade activity. 2. Multiple fractures as detailed above. 3. Other imaging findings of potential clinical significance: Aortic Atherosclerosis (ICD10-I70.0). Coronary atherosclerosis.   05/10/2019 -  Chemotherapy   -CyBorD (cyclophosphamide (CYTOXAN) 5575m (3054mm2), Velcade 1.75m22m2, and dexa 92m67meekly) starting 05/24/19.   -Due to neuropathy after 2 doses of Velcade, I switched to weekly  Kyprolis 3 weeks on/1 week off on 06/14/19.   -Chemo held for RT 07/05/19-07/22/19.   -Dexa stopped and Cytoxan dose reduced to 400 mg on day 1, 8 and 15 every 28 days, from cycle 3.     07/05/2019 - 07/20/2019 Radiation Therapy   By Dr. Tammi Klippel   Diagnosis:   79 yo woman with painful right occipital and left hip involvement by multiple myeloma      Indication for treatment:  Palliation        Radiation treatment dates:   07/05/19-07/20/19   Site/dose:    1.  The Right Occipital skull lesion was treated  to 20 Gy in 20 fractions of 2 Gy 2.  The Left Hip lesion was treated to 20 Gy in 20 fractions of 2 Gy   Beams/energy:      1.  The Right Occipital skull lesion was treated using a 3-field 3D set-up 2.  The Left Hip lesion was treated anterior and posterior fields.     CURRENT THERAPY:  -CyBorD (cyclophosphamide(CYTOXAN)517m (3066mm2),Velcade 1.87m66m2, and dexa 69m68meekly) starting 05/24/19. -Due toneuropathyafter 2 doses of Velcade, I switched toweeklyKyprolis 3 weeks on/1 week off on 06/14/19.Reduced to 2 weeks on/1 week off starting 09/16/19 due to thrombocytopenia.  -Chemo heldfor RT 07/05/19-07/22/19. -Dexareduced to 69mg40mytoxan dose reduced to 400 mg on day 1, 8 and 15 every 28 days, from cycle 3on 08/19/19, Cytoxan reduced to 300mgo69my 1, 8 every 21 daysafter 09/02/19. -Will reduce regimen to every 2 weeks starting with C8 on 12/09/19 -Restart Zometa injections every6 weekson 06/21/19.  INTERVAL HISTORY: Ms. MorrisLynnette Caffeyns for follow-up and treatment as scheduled.  She completed another cycle of Kyprolis/Cytoxan/Dex on 6/24. She feels unwell for few days after, tired with intermittent nausea no vomiting. She recovers after a few days. She to remain active. Weight is stable. Denies new pain. She takes 1/4 tab morphine in the morning to keep chronic pain stable throughout the day. Neuropathy is "unpleasant" but stable. Denies fever, chills, cough, chest pain, dyspnea, constipation or diarrhea.     MEDICAL HISTORY:  Past Medical History:  Diagnosis Date  . CAD (coronary artery disease)    [place] LHC (8/15- [facility] [place] 60%, mid to dist RCA 50% >>> med rx  . Cardiomy[place] (HCC)  [place]   . Chronic combined systolic and diastolic CHF (congestive heart failure) ([place]   . Cord compression (HCC) 1[place]0/13   MRI- diffuse myeloma involvement of T-L spine  . History of radiation  therapy 07/13/12-07/27/12   spinal cord compression T3-T10,left scapula  . 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 Multiple myeloma (HCC) 1[place]/2013  . MVP (mitral valve prolapse)    a. trivial by echo 06/2017.  . OSTEOPOROSIS 06/11/2010   Multiple compression fractures; and spontaneous fracture of sternum Qualifier: Diagnosis of  By: Saxon Zebedee Ibausan [name] Thoracic kyphosis 07/13/12   per MRI scan  . Unspecified deficiency anemia     SURGICAL HISTORY: Past Surgical History:  Procedure Laterality Date  . APPENDECTOMY    . CATARACT EXTRACTION, BILATERAL    . CESAREAN SECTION     x2   . COLONOSCOPY  2007   neg with Dr. Magod Watt ClimesBOW SURGERY    . HIP SURGERY  2009   left  . TUBAL LIGATION      I have reviewed the social history  and family history with the patient and they are unchanged from previous note.  ALLERGIES:  is allergic to zithromax [azithromycin], zosyn [piperacillin sod-tazobactam so], and quinolones.  MEDICATIONS:  Current Outpatient Medications  Medication Sig Dispense Refill  . acetaminophen (TYLENOL) 500 MG tablet Take 500 mg by mouth every 6 (six) hours as needed for moderate pain or fever.    Marland Kitchen acyclovir (ZOVIRAX) 400 MG tablet Take 1 tablet (400 mg total) by mouth 2 (two) times daily. 180 tablet 3  . aspirin EC 81 MG tablet Take 1 tablet (81 mg total) by mouth daily. 90 tablet 3  . atorvastatin (LIPITOR) 20 MG tablet TAKE 1 TABLET BY MOUTH  DAILY 90 tablet 3  . calcium-vitamin D (OSCAL WITH D) 500-200 MG-UNIT TABS tablet Take by mouth.    . cyclophosphamide (CYTOXAN) 50 MG capsule Take 6 capsules (319m) by mouth once every other week. 12 capsule 1  . doxycycline (VIBRA-TABS) 100 MG tablet Take 100 mg by mouth as needed (treatment for pneumonia).     . metoprolol succinate (TOPROL-XL) 25 MG 24 hr tablet TAKE 1 TABLET BY MOUTH  DAILY 90 tablet 3  . morphine (MSIR) 15 MG tablet Take 0.5 tablets (7.5 mg total) by mouth every 4 (four) hours as needed for severe  pain. 20 tablet 0  . ondansetron (ZOFRAN) 8 MG tablet Take 1 tablet (8 mg total) by mouth every 8 (eight) hours as needed for nausea or vomiting. 20 tablet 3  . prochlorperazine (COMPAZINE) 10 MG tablet Take 1 tablet (10 mg total) by mouth every 6 (six) hours as needed (Nausea or vomiting). 30 tablet 1   No current facility-administered medications for this visit.   Facility-Administered Medications Ordered in Other Visits  Medication Dose Route Frequency Provider Last Rate Last Admin  . 0.9 %  sodium chloride infusion   Intravenous Once FTruitt Merle MD        PHYSICAL EXAMINATION: ECOG PERFORMANCE STATUS: 1 - Symptomatic but completely ambulatory  Vitals:   01/20/20 1336  BP: (!) 144/79  Pulse: 78  Resp: 18  Temp: (!) 97.3 F (36.3 C)  SpO2: 97%   Filed Weights   01/20/20 1336  Weight: 114 lb 12.8 oz (52.1 kg)    GENERAL:alert, no distress and comfortable SKIN: no rash to exposed skin  EYES: sclera clear LUNGS:  normal breathing effort HEART:  no lower extremity edema NEURO: alert & oriented x 3 with fluent speech  LABORATORY DATA:  I have reviewed the data as listed CBC Latest Ref Rng & Units 01/20/2020 01/06/2020 12/24/2019  WBC 4.0 - 10.5 K/uL 3.1(L) 3.2(L) 2.9(L)  Hemoglobin 12.0 - 15.0 g/dL 8.8(L) 9.1(L) 9.0(L)  Hematocrit 36 - 46 % 27.1(L) 27.5(L) 27.5(L)  Platelets 150 - 400 K/uL 162 167 167     CMP Latest Ref Rng & Units 01/20/2020 01/06/2020 12/24/2019  Glucose 70 - 99 mg/dL 86 98 87  BUN 8 - 23 mg/dL 18 17 16   Creatinine 0.44 - 1.00 mg/dL 0.79 0.81 0.78  Sodium 135 - 145 mmol/L 142 138 142  Potassium 3.5 - 5.1 mmol/L 4.1 4.0 4.2  Chloride 98 - 111 mmol/L 111 109 107  CO2 22 - 32 mmol/L 20(L) 20(L) 23  Calcium 8.9 - 10.3 mg/dL 9.2 9.0 9.3  Total Protein 6.5 - 8.1 g/dL 6.4(L) 5.9(L) 5.8(L)  Total Bilirubin 0.3 - 1.2 mg/dL 1.1 0.8 0.9  Alkaline Phos 38 - 126 U/L 50 53 51  AST 15 - 41 U/L 20 16 16  ALT 0 - 44 U/L 16 13 11       RADIOGRAPHIC STUDIES: I have  personally reviewed the radiological images as listed and agreed with the findings in the report. No results found.   ASSESSMENT & PLAN: Shannon Rush a 12H.o.femalewith    1. IgG kappa Multiple myeloma s/p Auto SCT, intermediate risk, relapsed in 01/2016 -Diagnosed in 05/2012. Treated with induction chemo, radiation and BM transplant, but unfortunately progressed while on maintenance Revlimid 01/2016. She is currently onDaratumumabmonotherapy -Dexa was stoppeddue to her high risk of fracture, and well controlled MM -During COVID19 treatment changed toevery 4 weeks, and changed back to every 3 weeks in early June -Due toher slight disease progressionandsymptomatic bone lesion in left petrous apex, Dr. Burr Medico changed Dara back to q2 weeks. -Due to increase in M protein from 0.3 to 0.6, she underwent restaging bone marrow biopsy in 03/2019 which shows normocellular marrow and minimal involvement by plasma cell neoplasm (2%) -She also underwent PET scan in 03/2019 which shows scattered lytic lesions, some of which show hypermetabolic activity. -M spike up to 1.1 on 06/07/19 and she developed worsening cytopenias, consistent with progression -She started CyBorD on 05/24/19. She developed worsening neuropathy after first velcade injand it was changed to kyprolis after second dose.  -Her M spike decreased and sustained at 0.1, her chemo has been de-escalated to q2 weeks with cycle 8 on 12/09/19 -Ms. Sease appears stable.  She completed another cycle of Kyprolis, Cytoxan, and Dex.  She tolerates treatment well with mild fatigue and intermittent nausea managed with Compazine.  She remains active, able to function and recover well.  Her pain is stable on low-dose morphine 1/4 tab daily, no new pain. -CBC and CMP from today are stable.  MM panel on 6/24 is stable, M spike remained 0.1.  She will continue with another cycle of Kyprolis/Cytoxan/Dex today as planned. -She is leaving on a trip on  7/9 for a month, in 2 weeks she will take oral Cytoxan and Dex.  She knows to call clinic prior to dosing if she has fever, chills, or any concerning symptoms. -She will return for follow-up and Kyprolis/Cytoxan/Dex in 4 weeks.  2. Left petrous apex bone lesionand right occipital lesion -Her inner ear issues and dizziness progressed and she developedmildbalance issueand nausea  -Her MRI brain from 01/07/19 showedabnormal osseous lesion measuring up to approximately 5.9 cm centered at the left petrous apex. Thismost likely is the cause of her underlyingleft ear and balancesymptoms. -S/ptarget RT with Dr Tammi Klippel 7/1-7/15. -She has stable muffled hearing and mild imbalanceissue, no significant improvement after RT -S/p RT toright occipital lesion per Dr. Tammi Klippel starting 12/21- 07/20/19 -Her ear fullness and altered balance has much improved, but "tipsiness" not resolved; continue balance exercises   3. History of multiple pneumonia andbronchitis -She has had multiple episodes of pneumoniaand bronchitis, required hospitalizationandantibiotics -Her vaccines are up-to-date. -no clinical concern for recurrent infection   4. Pancytopenia -Secondary toMM and treatment  -Stable anemia. Thrombocytopenia and neutropenia resolved -monitoring   5. CHF, CAD, inferior STEMI in 02/2014, HLD -She is on ASA,beta blocker, statin -f/u with Dr. Meda Coffee and continue medications -normal EF and improved strain on echo from 04/27/19, per cardiology PA ok to do echo q6 months for now -CKMB in normal range, will monitor periodically on Kyprolis  6. Osteoporosis with multiple fractures -Last DEXA in 2015 showed worsening osteoporosis. She declined repeat DEXA. -She had a fall induced fracture in 01/2018. -She was on Zometa for 4 years,  stopped after April 2018 -she developed left hip pain,Xray on 11/30 negative for new fracture, hardware is intact. This is likely related to  MM -RestartedZometa on 06/21/19,dose-adjustedfor CrCl, continue q6 weeks (last dose 12/24/19) -S/ppalliative RTto left hip and right occipital skullper Dr. Tammi Klippel From12/21/20- 07/20/19, left hip painimproved -Her knee, hip, neck pains are stable at baseline, well controlled on low dose morphine PRN, takes some days and most nights.  -Denies new pain   7. Peripheral neuropathy in feet, G1-2 -Secondary to previous chemo,tuning fork exam shows decreased vibratory sense up to her ankle.  -previouslydeclined gabapentin -Worsened on Velcade which was switched to Kyprolis after 2 doses -Stable on Kyprolis, able to function well  8. Goal of care discussion  -full code -treatment goal is palliative  PLAN: -Labs reviewed, MM panel from 6/24 stable -Proceed with another cycle of kyprolis/cytoxan/dex today -Leaving on trip 7/9 - August -In 2 weeks she will take oral cytoxan and dex if she is feeling well while on her trip. She knows to call if she is having fever, chills, or other concerning symptoms before taking cytoxan/dex  -F/u and kyprolis/cytoxan/dex in 4 weeks   All questions were answered. The patient knows to call the clinic with any problems, questions or concerns. No barriers to learning was detected.     Alla Feeling, NP 01/20/20

## 2020-01-21 ENCOUNTER — Telehealth: Payer: Self-pay | Admitting: Nurse Practitioner

## 2020-01-21 NOTE — Telephone Encounter (Signed)
Scheduled per 7/8 los. Pt is aware of appts.

## 2020-01-25 ENCOUNTER — Telehealth: Payer: Self-pay

## 2020-01-25 ENCOUNTER — Telehealth: Payer: Self-pay | Admitting: *Deleted

## 2020-01-25 NOTE — Telephone Encounter (Signed)
Pt called back stating that she knows that call was returned but couldn't hear message.  She left Grandson's ph # [phone] to call back since reception was spotty on her mobile.  Returned call & relayed messages per Dr Burr Medico.  Pt expressed understanding.

## 2020-01-25 NOTE — Telephone Encounter (Signed)
Shannon Rush called to let us know about her lower left leg infection.  She said it is warm to the touch and she is using warm compresses and trip antibiotic ointment on it.  She said she has a prescription for doxycycline as well.  She is currently visiting her grandson in [place] and the cell phone service is spotty.    I consulted with Dr. Burr Medico and she advised for Shannon Rush to fill her prescription and start taking it.  She said she needs to call us if she develops fever or chills.    I called back Shannon Rush and left her a voicemail with these details and I also advised her to wear a high SPF sunscreen if she is out in the sun due to the side effects of doxycycline.    I will attempt to call her grandson if that number is on file as she said she was visiting him.  Gardiner Rhyme, RN

## 2020-01-27 DIAGNOSIS — I5181 Takotsubo syndrome: Secondary | ICD-10-CM | POA: Diagnosis not present

## 2020-01-27 DIAGNOSIS — G629 Polyneuropathy, unspecified: Secondary | ICD-10-CM | POA: Diagnosis not present

## 2020-01-27 DIAGNOSIS — D61810 Antineoplastic chemotherapy induced pancytopenia: Secondary | ICD-10-CM | POA: Diagnosis not present

## 2020-01-27 DIAGNOSIS — C9000 Multiple myeloma not having achieved remission: Secondary | ICD-10-CM | POA: Diagnosis not present

## 2020-01-27 DIAGNOSIS — I959 Hypotension, unspecified: Secondary | ICD-10-CM | POA: Diagnosis not present

## 2020-01-27 DIAGNOSIS — M810 Age-related osteoporosis without current pathological fracture: Secondary | ICD-10-CM | POA: Diagnosis not present

## 2020-01-27 DIAGNOSIS — E876 Hypokalemia: Secondary | ICD-10-CM | POA: Diagnosis not present

## 2020-01-27 DIAGNOSIS — E860 Dehydration: Secondary | ICD-10-CM | POA: Diagnosis not present

## 2020-01-27 DIAGNOSIS — L03116 Cellulitis of left lower limb: Secondary | ICD-10-CM | POA: Diagnosis not present

## 2020-01-27 DIAGNOSIS — I252 Old myocardial infarction: Secondary | ICD-10-CM | POA: Diagnosis not present

## 2020-01-27 DIAGNOSIS — I371 Nonrheumatic pulmonary valve insufficiency: Secondary | ICD-10-CM | POA: Diagnosis not present

## 2020-01-27 DIAGNOSIS — Z9484 Stem cells transplant status: Secondary | ICD-10-CM | POA: Diagnosis not present

## 2020-01-27 DIAGNOSIS — I083 Combined rheumatic disorders of mitral, aortic and tricuspid valves: Secondary | ICD-10-CM | POA: Diagnosis not present

## 2020-01-27 DIAGNOSIS — M79662 Pain in left lower leg: Secondary | ICD-10-CM | POA: Diagnosis not present

## 2020-01-27 DIAGNOSIS — I5042 Chronic combined systolic (congestive) and diastolic (congestive) heart failure: Secondary | ICD-10-CM | POA: Diagnosis not present

## 2020-01-27 DIAGNOSIS — I340 Nonrheumatic mitral (valve) insufficiency: Secondary | ICD-10-CM | POA: Diagnosis not present

## 2020-01-27 DIAGNOSIS — T451X5A Adverse effect of antineoplastic and immunosuppressive drugs, initial encounter: Secondary | ICD-10-CM | POA: Diagnosis not present

## 2020-01-27 DIAGNOSIS — M1990 Unspecified osteoarthritis, unspecified site: Secondary | ICD-10-CM | POA: Diagnosis not present

## 2020-01-27 DIAGNOSIS — M7122 Synovial cyst of popliteal space [Baker], left knee: Secondary | ICD-10-CM | POA: Diagnosis not present

## 2020-01-27 DIAGNOSIS — B958 Unspecified staphylococcus as the cause of diseases classified elsewhere: Secondary | ICD-10-CM | POA: Diagnosis not present

## 2020-01-27 DIAGNOSIS — R05 Cough: Secondary | ICD-10-CM | POA: Diagnosis not present

## 2020-01-27 DIAGNOSIS — D8489 Other immunodeficiencies: Secondary | ICD-10-CM | POA: Diagnosis not present

## 2020-01-27 DIAGNOSIS — I517 Cardiomegaly: Secondary | ICD-10-CM | POA: Diagnosis not present

## 2020-01-27 DIAGNOSIS — Z452 Encounter for adjustment and management of vascular access device: Secondary | ICD-10-CM | POA: Diagnosis not present

## 2020-01-27 DIAGNOSIS — M25462 Effusion, left knee: Secondary | ICD-10-CM | POA: Diagnosis not present

## 2020-01-27 DIAGNOSIS — Z20822 Contact with and (suspected) exposure to covid-19: Secondary | ICD-10-CM | POA: Diagnosis not present

## 2020-01-27 DIAGNOSIS — R918 Other nonspecific abnormal finding of lung field: Secondary | ICD-10-CM | POA: Diagnosis not present

## 2020-01-27 DIAGNOSIS — M40204 Unspecified kyphosis, thoracic region: Secondary | ICD-10-CM | POA: Diagnosis not present

## 2020-01-27 DIAGNOSIS — T40605A Adverse effect of unspecified narcotics, initial encounter: Secondary | ICD-10-CM | POA: Diagnosis not present

## 2020-01-27 DIAGNOSIS — M7989 Other specified soft tissue disorders: Secondary | ICD-10-CM | POA: Diagnosis not present

## 2020-01-27 DIAGNOSIS — I428 Other cardiomyopathies: Secondary | ICD-10-CM | POA: Diagnosis not present

## 2020-01-27 DIAGNOSIS — J90 Pleural effusion, not elsewhere classified: Secondary | ICD-10-CM | POA: Diagnosis not present

## 2020-01-27 DIAGNOSIS — K5903 Drug induced constipation: Secondary | ICD-10-CM | POA: Diagnosis not present

## 2020-01-27 DIAGNOSIS — G9529 Other cord compression: Secondary | ICD-10-CM | POA: Diagnosis not present

## 2020-01-27 DIAGNOSIS — I2510 Atherosclerotic heart disease of native coronary artery without angina pectoris: Secondary | ICD-10-CM | POA: Diagnosis not present

## 2020-01-27 DIAGNOSIS — M79604 Pain in right leg: Secondary | ICD-10-CM | POA: Diagnosis not present

## 2020-01-27 DIAGNOSIS — I272 Pulmonary hypertension, unspecified: Secondary | ICD-10-CM | POA: Diagnosis not present

## 2020-01-27 DIAGNOSIS — R2242 Localized swelling, mass and lump, left lower limb: Secondary | ICD-10-CM | POA: Diagnosis not present

## 2020-01-27 DIAGNOSIS — I361 Nonrheumatic tricuspid (valve) insufficiency: Secondary | ICD-10-CM | POA: Diagnosis not present

## 2020-01-27 DIAGNOSIS — J9811 Atelectasis: Secondary | ICD-10-CM | POA: Diagnosis not present

## 2020-01-29 DIAGNOSIS — B999 Unspecified infectious disease: Secondary | ICD-10-CM

## 2020-01-29 HISTORY — DX: Unspecified infectious disease: B99.9

## 2020-02-01 ENCOUNTER — Telehealth: Payer: Self-pay | Admitting: Hematology

## 2020-02-01 ENCOUNTER — Other Ambulatory Visit: Payer: Self-pay | Admitting: *Deleted

## 2020-02-01 ENCOUNTER — Telehealth: Payer: Self-pay | Admitting: *Deleted

## 2020-02-01 DIAGNOSIS — C9002 Multiple myeloma in relapse: Secondary | ICD-10-CM

## 2020-02-01 NOTE — Telephone Encounter (Signed)
Melissa from [place] called to schedule [facility] for labs and see Dr Burr Medico on Thursday or Friday this week. They need CBC, BMP and Vancomycin levels drawn- orders placed. [name] is having a PICC line placed at [facility] today- requested that they send [place] placement confirmation. She is going to fax discharge notes.  Message to scheduler to arrange lab, PICC flush and Dr Burr Medico on Thursday or Friday this week.

## 2020-02-01 NOTE — Telephone Encounter (Signed)
Scheduled appt per 7/20 sch msg - unable to reach pt. Left message with appt date and time

## 2020-02-02 NOTE — Progress Notes (Signed)
[place]   [phone] [phone]   Clinic Follow up Note   Patient Care Team: [name], MD as PCP - General (Family Medicine) [name], MD as PCP - Cardiology (Cardiology) [name], MD as Attending Physician (Obstetrics and Gynecology) [name], MD as Consulting Physician (Internal Medicine) [name], MD as Consulting Physician (Hematology) [name], MD as Consulting Physician (Ophthalmology) [name], MD as Consulting Physician (Cardiology) [name], MD as Consulting Physician (Dermatology) [name] (Dentistry) [name], DPM as Consulting Physician (Podiatry) Lazaro Arms, RN as [place] Management Lazaro Arms, RN as Case Manager  Date of Service:  02/04/2020  CHIEF COMPLAINT: F/u of MM  SUMMARY OF ONCOLOGIC HISTORY: Oncology History Overview Note  Multiple myeloma [facility])   Staging form: Multiple Myeloma, AJCC 6th Edition   - Clinical: Stage IIIA - Signed by [name], MD on 09/04/2013    Multiple myeloma ([place])  05/26/2012 Initial Diagnosis   Presenting IgG was 4,040 mg/dL on 06/05/2012 (IgA 35; IgM 34); kappa free light chain 34.4 mg/dL, lambda 0.00, kappa:lambda ratio of 34.75; SPEP with M-spike of 2.60.   06/30/2012 Imaging   Numerous lytic lesions throughout the calvarium.  Lytic lesion within the left lateral scapula.  Findings most compatible with metastases or myeloma. Multiple mid and lower thoracic compression fractures.  Slight compression through the endplates at L3.   21/30/8657 Bone Marrow Biopsy   Bone marrow biopsy showed 49% plasma cell. Normal classical cytogenetics; however, myeloma FISH panel showed 13q- (intermediate risk)       07/14/2012 - 07/27/2012 Radiation Therapy   Palliative radiation 20 Gy over 10 fractions between to thoracic spine cord compression and symptomatic left scapula lytic lesion.   08/10/2012  - 11/2012 Chemotherapy   Started SQ Velcade once weekly, 3 weeks on, 1 weeks off; daily Revlimid d1-21, 7 days off; and Dexamethasone '40mg'$  PO weekly.    02/12/2013 Bone Marrow Transplant   Auto bone marrow transplant at [facility]     06/14/2013 Tumor Marker   IgG  1830,  Kappa:lamba ratio, 1.69 (baseline was 34.75 or   M-spike 0.28 (baseline of 2.6 or  89.3% of baseline).     06/25/2013 -  Chemotherapy   Started zometa 3.5 mg monthly    09/01/2013 - 01/2014 Chemotherapy   Maintenance therapy with revlimid '5mg'$  daily for 14 days and then off for 7 (decreased from 21 days on and 7 days off based on neutropenia). Stopped due to disease progression 01/2017   09/13/2013 - 09/17/2014 Hospital Admission   Hospital admission for pneumonia   12/20/2013 Treatment Plan Change   Maintenance therapy decreased to 2.5 mg daily for 21 days and then off for 7 days based on low counts.    01/25/2014 Tumor Marker   IgG 1360 M spike 0.5   03/01/2014 - 03/05/2014 Hospital Admission   [facility] center with an inferior STEMI, received thrombolytic therapy, cath showed 60% prox RCA, mid-distal RCA 50%, Echo normal, no stents.   04/11/2014 - 08/07/2014 Chemotherapy   Continue Revlimid at 2.5 mg 3 weeks on 1 week off   08/08/2014 - 08/13/2014 Hospital Admission   Hospital admission for pneumonia. Her Revlimid was held.   08/29/2014 - 09/14/2014 Chemotherapy   Maintenance Revlimid restarted   09/14/2014 - 09/17/2014 Hospital Admission   Admitted for pneumonia after coming back from a trip in [name]. Revlimid was held again.   11/13/2014 - 12/25/2015 Chemotherapy  Maintenance Revlimid restarted, changed to 2.41m daily, 2 weks on, 1 week off from 12/15/2014, stopped due to disease progression    01/24/2016 Progression   Patient is M protein has gradually increased to 1.0g, repeated a bone marrow biopsy showed plasma cell 10-30%   02/27/2016 - 03/07/2016 Chemotherapy   Pomalidomide 4 mg daily on day 1-21,  dexamethasone 40 mg on day 1, 8 and 15, every 28 days, started on 02/27/2016, daratumumab weekly started on 03/07/2016, held after first dose, due to hospitalization and a severe pancytopenia, pomalidomide was stopped afterwards    03/10/2016 - 03/29/2016 Hospital Admission   Pt was admitted for sepsis from pneumonia, and severe pancytopenia. She required ICU stay for a few days due to hypotension, developed b/l pleural effusion required diuretics and b/l thoracentesis. She also required blood and plt transfusion, and prolonged neupogen injection for severe neutropenia. She was discharged home after 3 weeks hospital stay    04/26/2016 - 04/26/2019 Chemotherapy   Daratumumab restarted on 04/26/2016, Velcade 1.356mm2 and dexa 2079meekly start on 11/3, velcade held since 06/28/2016 due to CHF, dexa stopped on12/11/2017 due to high risk of fracture.  Dara reduced to every 4 weeks due to COVID-19 starting 10/23/18. Returned to every 3 weeks starting 12/17/18. Restarted every 2 weeks starting 02/22/19 Stopped on 04/26/19 due to disease progression.    08/26/2016 Echocardiogram   - Left ventricle: LVEF is approximately 35% with diffuse diffuse   hypokinesis with severe hypokinesis/ akinesis of the   inferior/inferoseptal walls. In comparison to echo images from   December 2017, there does not appear to be significant change The   cavity size was normal. Wall thickness was normal. Doppler   parameters are consistent with abnormal left ventricular   relaxation (grade 1 diastolic dysfunction). - Aortic valve: AV is thickened. 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 Present in echo from December 2017 There was   trivial regurgitation. - Right ventricle: Systolic function was mildly reduced. - Right atrium: The atrium was mildly dilated. - Pericardium, extracardiac: A trivial pericardial effusion was   identified.   12/03/2016 Echocardiogram    -Left Ventricle: Systolic function was   mildly to moderately reduced. The estimated ejection fraction was   in the range of 40% to 45%. Diffuse hypokinesis. Doppler   parameters are consistent with abnormal left ventricular   relaxation (grade 1 diastolic dysfunction). Doppler parameters   are consistent with indeterminate ventricular filling pressure. - Left atrium: The atrium was severely dilated. - Tricuspid valve: There was trivial regurgitation. - Pulmonary arteries: Systolic pressure was within the normal   range. PA peak pressure: 33 mm Hg (S). - Global longitudinal strain -10.5% (abnormal)   03/12/2017 Echocardiogram   ECHO 03/12/17 Study Conclusions - Left ventricle: The cavity size was normal. Wall thickness was   increased in a pattern of mild LVH. Systolic function was normal.   The estimated ejection fraction was in the range of 50% to 55%.   There is hypokinesis of the basalinferior myocardium. Doppler   parameters are consistent with abnormal left ventricular   relaxation (grade 1 diastolic dysfunction). - Aortic valve: There was trivial regurgitation. - Mitral valve: Calcified annulus. Mildly thickened leaflets .   There was trivial regurgitation. - Tricuspid valve: There was mild regurgitation. Impressions: - There has been mild improvement in EF since prior study.    12/12/2017 - 12/15/2017 Hospital Admission   Admission diagnosis: Pnuemonia and acute hypoxic  respiratory failure Additional comments: treated for pneumoania and acute hypoxic respiratory failure before she was discharged on 12/15/17 with oral anitbiotics. She did not require home oxygen.    12/30/2017 - 01/02/2018 Hospital Admission   Admission diagnosis: Pnuemonia  Additional comments: She was again hospitalized on 12/30/17 for pneumonia, ID work up was otherwise negative, she was discharged home with oral antibiotics.    04/01/2018 Imaging   04/01/2018 CXR IMPRESSION: Persisting airspace disease  superimposed on chronic changes, suggesting pneumonia given the history. Followup PA and lateral chest X-ray is recommended in 3-4 weeks following therapy to ensure resolution and exclude underlying malignancy.  Redemonstration of mid and lower thoracic compression fractures with associated thoracic kyphosis.   04/23/2018 Echocardiogram   04/23/2018 ECHO LV EF: 45% -   50%   01/07/2019 Imaging   MRI Brain 01/07/19 IMPRESSION: MRI HEAD IMPRESSION:   1. 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 Lesion closely approximates the left inner ear structures and involves the left Meckel's cave and left fifth cranial nerve, and most likely is the causative etiology for patient's underlying left ear and facial symptoms. Follow-up examination with postcontrast imaging suggested for complete evaluation. Inclusion of IAC sequences would likely be helpful for visualization. 2. Additional 2.7 cm lesion involving the right occipital calvarium with adjacent marrow signal abnormality, also likely related to multiple myeloma. Metastatic disease would be the primary differential consideration. 3. Underlying age-appropriate cerebral atrophy with mild chronic microvascular ischemic disease. No other acute intracranial abnormality.   MRA HEAD IMPRESSION:   1. Negative intracranial MRA with no large vessel occlusion, hemodynamically significant stenosis, or other acute vascular abnormality. 2. Petrous and cavernous left ICA is partially encased and surrounded by the left petrous apex lesion without significant irregularity or stenosis. 3. 2 mm right paraophthalmic aneurysm.   MRA NECK IMPRESSION:   Normal MRA of the neck with wide patency of the carotid and vertebral arteries bilaterally. No hemodynamically significant stenosis or other acute vascular abnormality  identified.    01/14/2019 - 01/27/2019 Radiation Therapy   By Dr. Tammi Klippel.    Diagnosis:   79 yo woman with skull base myeloma lesion causing left ear pain      Indication for treatment:  Palliative        Radiation treatment dates:   01/14/2019 - 01/27/2019   Site/dose:   Skull Base / 20 Gy in 10 fractions   Beams/energy:   3D, photons / 10X, 6X   04/01/2019 Pathology Results   Bone Marrow biopsy 04/01/19  DIAGNOSIS: BONE MARROW, ASPIRATE, CLOT, CORE: - Normocellular marrow with minimal involvement by plasma cell neoplasm - See comment PERIPHERAL BLOOD: - Macrocytic anemia and leukopenia -See complete blood count   04/09/2019 PET scan   PET 04/09/19  IMPRESSION: 1. Scattered lytic lesions of bone. A few of these demonstrate hypermetabolic activity, most notably the right occipital bone lesion; the lytic lesion in the approximately C7 spinous process; the lytic expansile lesion of the left fourth rib anteriorly; and a small lytic focus in the right femoral head. Other scattered lytic lesions have only low-grade activity. 2. Multiple fractures as detailed above. 3. Other imaging findings of potential clinical significance: Aortic Atherosclerosis (ICD10-I70.0). Coronary atherosclerosis.   05/10/2019 -  Chemotherapy   -CyBorD (cyclophosphamide (CYTOXAN) '500mg'$  ('300mg'$ /m2), Velcade 1.'3mg'$ /m2, and dexa '20mg'$ , weekly) starting 05/24/19.   -Due to neuropathy after 2 doses of Velcade, I  switched to weekly Kyprolis 3 weeks on/1 week off on 06/14/19.   -Chemo held for RT 07/05/19-07/22/19.   -Dexa stopped and Cytoxan dose reduced to 400 mg on day 1, 8 and 15 every 28 days, from cycle 3.     07/05/2019 - 07/20/2019 Radiation Therapy   By Dr. Tammi Klippel   Diagnosis:   79 yo woman with painful right occipital and left hip involvement by multiple myeloma      Indication for treatment:  Palliation        Radiation treatment dates:   07/05/19-07/20/19   Site/dose:    1.  The Right Occipital skull  lesion was treated to 20 Gy in 20 fractions of 2 Gy 2.  The Left Hip lesion was treated to 20 Gy in 20 fractions of 2 Gy   Beams/energy:      1.  The Right Occipital skull lesion was treated using a 3-field 3D set-up 2.  The Left Hip lesion was treated anterior and posterior fields.      CURRENT THERAPY:  -CyBorD (cyclophosphamide(CYTOXAN)'500mg'$  ('300mg'$ /m2),Velcade 1.'3mg'$ /m2, and dexa '20mg'$ , weekly) starting 05/24/19. -Due toneuropathyafter 2 doses of Velcade, I switched toweeklyKyprolis 3 weeks on/1 week off on 06/14/19.Reduced to 2 weeks on/1 week off starting 09/16/19 due to thrombocytopenia.  -Chemo heldfor RT 07/05/19-07/22/19. -Dexareduced to '20mg'$ andCytoxan dose reduced to 400 mg on day 1, 8 and 15 every 28 days, from cycle 3on 08/19/19, Cytoxan reduced to '300mg'$ on day 1, 8 every 21 daysafter 09/02/19. -Reduce regimen to every 2 weeks starting with C8 on 12/09/19 -Restart Zometa injections every6 weekson 06/21/19.   INTERVAL HISTORY:  Shannon Rush is here for a follow up of MM. She presents to the clinic alone. She started having b/l leg swelling on 7/15 and went to ED. She also had rash and skin erythema. She tried Doxycycline with not much help. She was treated with multiple antibiotics at ED with little response. She is still on IV Antibiotics at home with central lines. She is to complete 10 total days of Vancomycin and  ceftriaxone at home. She denies having fever. She notes she was told it is not best to travel to [place] given current infection.  She notes she tolerated last cycle treatment well. She notes stable hip discomfort and notes normal BM and urination. For her left hip she will take half or quarter dose of Morphine at a time.    REVIEW OF SYSTEMS:   Constitutional: Denies fevers, chills or abnormal weight loss Eyes: Denies blurriness of vision Ears, nose, mouth, throat, and face: Denies mucositis or sore  throat Respiratory: Denies cough, dyspnea or wheezes Cardiovascular: Denies palpitation, chest discomfort (+) B/l lower extremity swelling Gastrointestinal:  Denies nausea, heartburn or change in bowel habits Skin: (+) Skin erythema and rash of b/l LE with swelling.  Lymphatics: Denies new lymphadenopathy or easy bruising MSK: (+) left hip discomfort stable  Neurological:Denies numbness, tingling or new weaknesses Behavioral/Psych: Mood is stable, no new changes  All other systems were reviewed with the patient and are negative.  MEDICAL HISTORY:  Past Medical History:  Diagnosis Date  . CAD (coronary artery disease)    a. inf STEMI (in [place] >>> [facility]) >>> LHC (8/15- [place]):  pRCA 60%, mid to dist RCA 50% >>> med rx  . Cardiomyopathy ([place])   . Cataract   . Chronic combined systolic and diastolic CHF (congestive heart failure) ([place])    cancer medication related   . Cord compression ([place])  07/13/12   MRI- diffuse myeloma involvement of T-L spine  . History of radiation therapy 07/13/12-07/27/12   spinal cord compression T3-T10,left scapula  . 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 Multiple myeloma ([place]) 07/01/2012  . MVP (mitral valve prolapse)    a. trivial by echo 06/2017.  . OSTEOPOROSIS 06/11/2010   Multiple compression fractures; and spontaneous fracture of sternum Qualifier: Diagnosis of  By: [name], [name]     . Thoracic kyphosis 07/13/12   per MRI scan  . Unspecified deficiency anemia     SURGICAL HISTORY: Past Surgical History:  Procedure Laterality Date  . APPENDECTOMY    . CATARACT EXTRACTION, BILATERAL    . CESAREAN SECTION     x2   . COLONOSCOPY  2007   neg with Dr. [name]  . ELBOW SURGERY    . HIP SURGERY  2009   left  . TUBAL LIGATION      I have reviewed the social history and family history with the patient and they are unchanged from previous note.  ALLERGIES:  is allergic to zithromax [azithromycin], zosyn [piperacillin  sod-tazobactam so], and quinolones.  MEDICATIONS:  Current Outpatient Medications  Medication Sig Dispense Refill  . acetaminophen (TYLENOL) 500 MG tablet Take 500 mg by mouth every 6 (six) hours as needed for moderate pain or fever.    Marland Kitchen acyclovir (ZOVIRAX) 400 MG tablet Take 1 tablet (400 mg total) by mouth 2 (two) times daily. 180 tablet 3  . aspirin EC 81 MG tablet Take 1 tablet (81 mg total) by mouth daily. 90 tablet 3  . atorvastatin (LIPITOR) 20 MG tablet TAKE 1 TABLET BY MOUTH  DAILY 90 tablet 3  . calcium-vitamin D (OSCAL WITH D) 500-200 MG-UNIT TABS tablet Take by mouth.    . cyclophosphamide (CYTOXAN) 50 MG capsule Take 6 capsules ('300mg'$ ) by mouth once every other week. 12 capsule 1  . metoprolol succinate (TOPROL-XL) 25 MG 24 hr tablet TAKE 1 TABLET BY MOUTH  DAILY 90 tablet 3  . morphine (MSIR) 15 MG tablet Take 0.5 tablets (7.5 mg total) by mouth every 8 (eight) hours as needed for severe pain. 20 tablet 0  . ondansetron (ZOFRAN) 8 MG tablet Take 1 tablet (8 mg total) by mouth every 8 (eight) hours as needed for nausea or vomiting. 20 tablet 3  . prochlorperazine (COMPAZINE) 10 MG tablet Take 1 tablet (10 mg total) by mouth every 6 (six) hours as needed (Nausea or vomiting). 30 tablet 1  . triamcinolone cream (KENALOG) 0.1 % Apply 0.1 application topically 2 (two) times daily as needed.    . doxycycline (VIBRA-TABS) 100 MG tablet Take 100 mg by mouth as needed (treatment for pneumonia).      No current facility-administered medications for this visit.    PHYSICAL EXAMINATION: ECOG PERFORMANCE STATUS: 2 - Symptomatic, <50% confined to bed  Vitals:   02/04/20 0911  BP: (!) 105/62  Pulse: 72  Resp: 17  Temp: 97.7 F (36.5 C)  SpO2: 100%   Filed Weights   02/04/20 0911  Weight: 118 lb 14.4 oz (53.9 kg)    Due to COVID19 we will limit examination to appearance. Patient had no complaints.  GENERAL:alert, no distress and comfortable SKIN: (+) Skin erythema, rash and  swelling of b/l lower legs.  EYES: normal, Conjunctiva are pink and non-injected, sclera clear  NEURO: alert & oriented x 3 with fluent speech   LABORATORY DATA:  I have reviewed the data as listed CBC Latest  Ref Rng & Units 02/04/2020 01/20/2020 01/06/2020  WBC 4.0 - 10.5 K/uL 3.2(L) 3.1(L) 3.2(L)  Hemoglobin 12.0 - 15.0 g/dL 9.8(L) 8.8(L) 9.1(L)  Hematocrit 36 - 46 % 29.8(L) 27.1(L) 27.5(L)  Platelets 150 - 400 K/uL 120(L) 162 167     CMP Latest Ref Rng & Units 02/04/2020 01/20/2020 01/06/2020  Glucose 70 - 99 mg/dL 87 86 98  BUN 8 - 23 mg/dL '14 18 17  '$ Creatinine 0.44 - 1.00 mg/dL 0.64 0.79 0.81  Sodium 135 - 145 mmol/L 141 142 138  Potassium 3.5 - 5.1 mmol/L 3.8 4.1 4.0  Chloride 98 - 111 mmol/L 108 111 109  CO2 22 - 32 mmol/L 23 20(L) 20(L)  Calcium 8.9 - 10.3 mg/dL 9.1 9.2 9.0  Total Protein 6.5 - 8.1 g/dL - 6.4(L) 5.9(L)  Total Bilirubin 0.3 - 1.2 mg/dL - 1.1 0.8  Alkaline Phos 38 - 126 U/L - 50 53  AST 15 - 41 U/L - 20 16  ALT 0 - 44 U/L - 16 13      RADIOGRAPHIC STUDIES: I have personally reviewed the radiological images as listed and agreed with the findings in the report. No results found.   ASSESSMENT & PLAN:  Shannon Rush is a 79 y.o. female with    1. Left leg cellulitis and swelling  -Onset week of 01/27/20 with skin erythema, rash and swelling of lower legs. She notes initial weeping, but not open wound.  -After 7/15 hospital admission she was treated with antibiotics but did not fully respond. She is currently on 10 day Ceftriaxone and Vancomycin IVs at home. She has central line in place and will continue home care during this treatment.  -She did have skin rash of upper body during hospitalization that resolved with topical steroids.  -I will notify her home care team of Vancomycin Labs from today (02/04/20) -I encouraged her to elevate her legs at home and she can use compression socks while on her feet. I also encouraged her to remains active to reduce risk of  blood clots.    2. IgG kappa Multiple myeloma s/p Auto SCT, intermediate risk, relapsed in 7/2017and progressed again in Oct 2020 -Diagnosed in 05/2012. Treated with induction chemo, radiation and BM transplant, but unfortunately progressed while on maintenance Revlimid 01/2016. She is currently onDaratumumabinfusion monotherapy.  -PETimagesfrom 04/09/19 showedScattered lytic lesions of bone. A few of these demonstrate hypermetabolic activity, most notably the right occipital bone lesion, C7 spinous process, Left 4th rib, andright femoral head.  -Herbone marrow biopsy from 04/02/19 showsnormocellular marrow with minimal involvement by plasma cell neoplasm(2%). -Unfortunately she has progressedpreviouslywith increased M-protein and more symptomatic bone pain from MM, s/p palliative radiation to left hip and right scalp -Based on Dr. Noah Delaine recommendation I changedher treatment to CyBorD (oral cyclophosphamide '500mg'$ , Velcade 1.'3mg'$ /m2, and dexa '20mg'$ , weekly) started on11/9/20.After 2 doses of Velcade, I switched toweeklyKyprolis3 weeks on/1 week offstarting 06/14/19 due to neuropathy.Chemo waspreviouslyheld forpalliative RT.Cytoxan has been dose reduced and held as needed due to thrombocytopenia. Kyprolis reduced to 2 weeks on/1 week off starting 09/16/19 due to thrombocytopenia. Her M-protein has reduced to 0.1in the past two months. We de-escalated her chemo to every 2 weeks starting with C8 on 12/09/19.  -Treatment held after 01/20/20 due to recent LE skin infection. She is currently on IV Antibiotics.  -Labs reviewed, CBC shows WBC 3.2, Hg 9.8, plt 120K. MM panel still pending. Will continue to hold treatment.  -f/u in 2 weeks to discuss restart of chemo.  2.Left hip pain and fracture in 09/07/19 -she had left hip surgery with rod in 2009  -She developed new left hip pain on weightbearingIn Dec 2020, no painat resting -She received target RT 07/05/19-07/20/19. Now her  pain is nearly resolved, minimalnow.  -She was seen to have left periprosthetic hip fracture when she went to ED on 09/07/19. She is able to bear some weight with pain 1/10. She takes Tylenol but has quick release Morphine half or quarter tablet for significant pain. I refilled Morphine today (02/04/20)   3. Left petrous apex bone lesion with hearing lossandRight OccipitalskullLesion  -Her MRI brain from 01/07/19 showedabnormal osseous lesion measuring up to approximately 5.9 cm centered at the left petrous apex. Thismost likely is the cause of her underlyingleft ear and balancesymptoms. -Given she is symptomatic, sheunderwent target RTto skull case myeloma lesionwith Dr Tammi Klippel 7/1-7/15/20. This is negative on9/25/20PET scan -Her PET from 04/09/19 shows lesion with hypermetabolic activity in this area concerning for MM progression here.She was symptomatic. -She recently completedRT to right occipital lesion per Dr. Manning12/21/20-07/20/19.Tolerated well.She has moderate hair loss at RT site.  -Her ear fullness and altered balance has much improved, continue balance exercises  4. History of multiple pneumonia andbronchitis -She has had multiple episodes of pneumonia, required hospitalization,and bronchitis, required antibiotics -She has recovered well, will continue monitoring. I encouraged her to do spirometry,and exercise. -Her vaccines are up-to-date.She has received COVID vaccines  5. Pancytopenia -She mainly has anemia and Thrombocytopenia concerningwhich occurred with recent(03/2019) disease progression. -Did give bloodtransfusionon 06/04/19 -Has recurred with oral Cytoxan.  6. CHF, CAD, inferior STEMI in 02/2014, H[facility] -She is on ASA, BB, statin. She will continue to f/u with Dr. Meda Coffee -BPwell controlled and normal lately. -10/2020echo showed normal EF, will continue monitoring   7. Osteoporosis with multiple fractures  -Last DEXA in 2015 showed  worsening osteoporosis. She declined repeat DEXA.  -She had a fall induced fracture in 01/2018. -She was on Zometa for 4 years, stopped after April 2018  -I restarted herZometaq6weeksto help strengthen her boneon12/7/20. Will monitor her dental health -She was seen to have left periprosthetic hip fracture when she went to ED on 09/07/19.   8. Peripheral neuropathy in feet, G2 -Secondary to previous chemo.Worsenedsince she started Velcade. Now she is onKyprolis since 11/20/21which cause less neuropathy. -She previously declined Gabapentin. This is stable and controlled.   9. Goal of care discussion  -She is full code now. Treatment goal is palliative.   10Steroid induced hyperglycemia -Secondary to steroids,her random blood glucose was 217on 06/07/19. Has much improved recently. -I encouraged her to drink plenty of water and reduce sugar and carbohydrate in diet.  -06/21/19 A1c at 6.3. -Dexamethasone has been reduced to 20 mg every 2 weeks,BG has been normal lately   Plan -I refilled Morphine today (02/04/20) -Continue IV Antibiotics at home, will fax her lab to her home care agent  -Continue to hold chemo treatment  -Lab, f/u and Chemo in 2 weeks.   No problem-specific Assessment & Plan notes found for this encounter.   No orders of the defined types were placed in this encounter.  All questions were answered. The patient knows to call the clinic with any problems, questions or concerns. No barriers to learning was detected. The total time spent in the appointment was 30 minutes.     [name], MD 02/04/2020   I, Joslyn Devon, am acting as scribe for [name], MD.   I have reviewed the above documentation for accuracy  and completeness, and I agree with the above.

## 2020-02-03 DIAGNOSIS — L03116 Cellulitis of left lower limb: Secondary | ICD-10-CM | POA: Diagnosis not present

## 2020-02-04 ENCOUNTER — Inpatient Hospital Stay: Payer: Medicare Other

## 2020-02-04 ENCOUNTER — Other Ambulatory Visit: Payer: Self-pay

## 2020-02-04 ENCOUNTER — Inpatient Hospital Stay: Payer: Medicare Other | Admitting: Hematology

## 2020-02-04 ENCOUNTER — Encounter: Payer: Self-pay | Admitting: Hematology

## 2020-02-04 VITALS — BP 105/62 | HR 72 | Temp 97.7°F | Resp 17 | Ht 66.5 in | Wt 118.9 lb

## 2020-02-04 DIAGNOSIS — E785 Hyperlipidemia, unspecified: Secondary | ICD-10-CM | POA: Diagnosis not present

## 2020-02-04 DIAGNOSIS — L03116 Cellulitis of left lower limb: Secondary | ICD-10-CM | POA: Diagnosis not present

## 2020-02-04 DIAGNOSIS — C9002 Multiple myeloma in relapse: Secondary | ICD-10-CM | POA: Diagnosis not present

## 2020-02-04 DIAGNOSIS — Z9221 Personal history of antineoplastic chemotherapy: Secondary | ICD-10-CM | POA: Diagnosis not present

## 2020-02-04 DIAGNOSIS — I2510 Atherosclerotic heart disease of native coronary artery without angina pectoris: Secondary | ICD-10-CM | POA: Diagnosis not present

## 2020-02-04 DIAGNOSIS — L03119 Cellulitis of unspecified part of limb: Secondary | ICD-10-CM | POA: Insufficient documentation

## 2020-02-04 DIAGNOSIS — Z79899 Other long term (current) drug therapy: Secondary | ICD-10-CM | POA: Diagnosis not present

## 2020-02-04 DIAGNOSIS — M818 Other osteoporosis without current pathological fracture: Secondary | ICD-10-CM

## 2020-02-04 DIAGNOSIS — Z9481 Bone marrow transplant status: Secondary | ICD-10-CM | POA: Diagnosis not present

## 2020-02-04 DIAGNOSIS — Z7982 Long term (current) use of aspirin: Secondary | ICD-10-CM | POA: Diagnosis not present

## 2020-02-04 DIAGNOSIS — G629 Polyneuropathy, unspecified: Secondary | ICD-10-CM | POA: Diagnosis not present

## 2020-02-04 DIAGNOSIS — Z923 Personal history of irradiation: Secondary | ICD-10-CM | POA: Diagnosis not present

## 2020-02-04 DIAGNOSIS — Z5112 Encounter for antineoplastic immunotherapy: Secondary | ICD-10-CM | POA: Diagnosis not present

## 2020-02-04 DIAGNOSIS — I252 Old myocardial infarction: Secondary | ICD-10-CM | POA: Diagnosis not present

## 2020-02-04 LAB — CBC WITH DIFFERENTIAL (CANCER CENTER ONLY)
Abs Immature Granulocytes: 0.06 10*3/uL (ref 0.00–0.07)
Basophils Absolute: 0 10*3/uL (ref 0.0–0.1)
Basophils Relative: 1 %
Eosinophils Absolute: 0 10*3/uL (ref 0.0–0.5)
Eosinophils Relative: 1 %
HCT: 29.8 % — ABNORMAL LOW (ref 36.0–46.0)
Hemoglobin: 9.8 g/dL — ABNORMAL LOW (ref 12.0–15.0)
Immature Granulocytes: 2 %
Lymphocytes Relative: 3 %
Lymphs Abs: 0.1 10*3/uL — ABNORMAL LOW (ref 0.7–4.0)
MCH: 34.3 pg — ABNORMAL HIGH (ref 26.0–34.0)
MCHC: 32.9 g/dL (ref 30.0–36.0)
MCV: 104.2 fL — ABNORMAL HIGH (ref 80.0–100.0)
Monocytes Absolute: 0.4 10*3/uL (ref 0.1–1.0)
Monocytes Relative: 13 %
Neutro Abs: 2.6 10*3/uL (ref 1.7–7.7)
Neutrophils Relative %: 80 %
Platelet Count: 120 10*3/uL — ABNORMAL LOW (ref 150–400)
RBC: 2.86 MIL/uL — ABNORMAL LOW (ref 3.87–5.11)
RDW: 17.6 % — ABNORMAL HIGH (ref 11.5–15.5)
WBC Count: 3.2 10*3/uL — ABNORMAL LOW (ref 4.0–10.5)
nRBC: 0 % (ref 0.0–0.2)

## 2020-02-04 LAB — BASIC METABOLIC PANEL - CANCER CENTER ONLY
Anion gap: 10 (ref 5–15)
BUN: 14 mg/dL (ref 8–23)
CO2: 23 mmol/L (ref 22–32)
Calcium: 9.1 mg/dL (ref 8.9–10.3)
Chloride: 108 mmol/L (ref 98–111)
Creatinine: 0.64 mg/dL (ref 0.44–1.00)
GFR, Est AFR Am: >60 mL/min (ref >60)
GFR, Estimated: >60 mL/min (ref >60)
Glucose, Bld: 87 mg/dL (ref 70–99)
Potassium: 3.8 mmol/L (ref 3.5–5.1)
Sodium: 141 mmol/L (ref 135–145)

## 2020-02-04 LAB — VANCOMYCIN, RANDOM: Vancomycin Rm: 9

## 2020-02-04 MED ORDER — MORPHINE SULFATE 15 MG PO TABS: 7.5000 mg | ORAL_TABLET | Freq: Three times a day (TID) | ORAL | 0 refills | Status: AC | PRN

## 2020-02-05 ENCOUNTER — Encounter: Payer: Self-pay | Admitting: Hematology

## 2020-02-06 DIAGNOSIS — L03116 Cellulitis of left lower limb: Secondary | ICD-10-CM | POA: Diagnosis not present

## 2020-02-07 ENCOUNTER — Telehealth: Payer: Self-pay | Admitting: Hematology

## 2020-02-07 DIAGNOSIS — M7989 Other specified soft tissue disorders: Secondary | ICD-10-CM | POA: Diagnosis not present

## 2020-02-07 DIAGNOSIS — L03116 Cellulitis of left lower limb: Secondary | ICD-10-CM | POA: Diagnosis not present

## 2020-02-07 NOTE — Telephone Encounter (Signed)
No 7/23 los

## 2020-02-08 LAB — MULTIPLE MYELOMA PANEL, SERUM
Albumin SerPl Elph-Mcnc: 3.1 g/dL (ref 2.9–4.4)
Albumin/Glob SerPl: 1.7 (ref 0.7–1.7)
Alpha 1: 0.3 g/dL (ref 0.0–0.4)
Alpha2 Glob SerPl Elph-Mcnc: 0.5 g/dL (ref 0.4–1.0)
B-Globulin SerPl Elph-Mcnc: 0.7 g/dL (ref 0.7–1.3)
Gamma Glob SerPl Elph-Mcnc: 0.4 g/dL (ref 0.4–1.8)
Globulin, Total: 1.9 g/dL — ABNORMAL LOW (ref 2.2–3.9)
IgA: <5 mg/dL — ABNORMAL LOW (ref 64–422)
IgG (Immunoglobin G), Serum: 474 mg/dL — ABNORMAL LOW (ref 586–1602)
IgM (Immunoglobulin M), Srm: <5 mg/dL — ABNORMAL LOW (ref 26–217)
M Protein SerPl Elph-Mcnc: 0.3 g/dL — ABNORMAL HIGH
Total Protein ELP: 5 g/dL — ABNORMAL LOW (ref 6.0–8.5)

## 2020-02-09 DIAGNOSIS — M7989 Other specified soft tissue disorders: Secondary | ICD-10-CM | POA: Diagnosis not present

## 2020-02-09 DIAGNOSIS — L03116 Cellulitis of left lower limb: Secondary | ICD-10-CM | POA: Diagnosis not present

## 2020-02-10 ENCOUNTER — Other Ambulatory Visit: Payer: Self-pay | Admitting: Hematology

## 2020-02-10 ENCOUNTER — Other Ambulatory Visit: Payer: Self-pay

## 2020-02-10 ENCOUNTER — Telehealth: Payer: Self-pay

## 2020-02-10 DIAGNOSIS — C9002 Multiple myeloma in relapse: Secondary | ICD-10-CM

## 2020-02-10 DIAGNOSIS — M7989 Other specified soft tissue disorders: Secondary | ICD-10-CM

## 2020-02-10 DIAGNOSIS — L03116 Cellulitis of left lower limb: Secondary | ICD-10-CM

## 2020-02-10 MED ORDER — CYCLOPHOSPHAMIDE 50 MG PO CAPS: ORAL_CAPSULE | ORAL | 1 refills | Status: DC

## 2020-02-10 NOTE — Telephone Encounter (Signed)
Shannon Rush called stating that her R leg is now swollen and tender, there has been no trauma to the leg.  She remains afebrile.  Reviewed with Dr. Burr Medico.  Stat doppler right leg ordered scheduled for 7/30 1000 at [facility] and patient to see Dr. Burr Medico tomorrow at 1140.  Shannon Scherman notified and verbalized understanding.

## 2020-02-10 NOTE — Progress Notes (Signed)
[place]   [phone] [phone]   Clinic Follow up Note   Patient Care Team: [name], MD as PCP - General (Family Medicine) [name], MD as PCP - Cardiology (Cardiology) [name], MD as Attending Physician (Obstetrics and Gynecology) [name], MD as Consulting Physician (Internal Medicine) [name], MD as Consulting Physician (Hematology) [name], MD as Consulting Physician (Ophthalmology) [name], MD as Consulting Physician (Cardiology) [name], MD as Consulting Physician (Dermatology) [name] (Dentistry) [name], DPM as Consulting Physician (Podiatry) Lazaro Arms, RN as [place] Management Lazaro Arms, RN as Case Manager  Date of Service:  02/11/2020  CHIEF COMPLAINT: right LE edema   SUMMARY OF ONCOLOGIC HISTORY: Oncology History Overview Note  Multiple myeloma [facility])   Staging form: Multiple Myeloma, AJCC 6th Edition   - Clinical: Stage IIIA - Signed by [name], MD on 09/04/2013    Multiple myeloma ([place])  05/26/2012 Initial Diagnosis   Presenting IgG was 4,040 mg/dL on 06/05/2012 (IgA 35; IgM 34); kappa free light chain 34.4 mg/dL, lambda 0.00, kappa:lambda ratio of 34.75; SPEP with M-spike of 2.60.   06/30/2012 Imaging   Numerous lytic lesions throughout the calvarium.  Lytic lesion within the left lateral scapula.  Findings most compatible with metastases or myeloma. Multiple mid and lower thoracic compression fractures.  Slight compression through the endplates at L3.   32/67/1245 Bone Marrow Biopsy   Bone marrow biopsy showed 49% plasma cell. Normal classical cytogenetics; however, myeloma FISH panel showed 13q- (intermediate risk)       07/14/2012 - 07/27/2012 Radiation Therapy   Palliative radiation 20 Gy over 10 fractions between to thoracic spine cord compression and symptomatic left scapula lytic lesion.     08/10/2012 - 11/2012 Chemotherapy   Started SQ Velcade once weekly, 3 weeks on, 1 weeks off; daily Revlimid d1-21, 7 days off; and Dexamethasone 67m PO weekly.    02/12/2013 Bone Marrow Transplant   [facility]    06/14/2013 Tumor Marker   IgG  1830,  Kappa:lamba ratio, 1.69 (baseline was 34.75 or   M-spike 0.28 (baseline of 2.6 or  89.3% of baseline).     06/25/2013 -  Chemotherapy   Started zometa 3.5 mg monthly    09/01/2013 - 01/2014 Chemotherapy   Maintenance therapy with revlimid 554mdaily for 14 days and then off for 7 (decreased from 21 days on and 7 days off based on neutropenia). Stopped due to disease progression 01/2017   09/13/2013 - 09/17/2014 Hospital Admission   Hospital admission for pneumonia   12/20/2013 Treatment Plan Change   Maintenance therapy decreased to 2.5 mg daily for 21 days and then off for 7 days based on low counts.    01/25/2014 Tumor Marker   IgG 1360 M spike 0.5   03/01/2014 - 03/05/2014 Hospital Admission   [facility], received thrombolytic therapy, cath showed 60% prox RCA, mid-distal RCA 50%, Echo normal, no stents.   04/11/2014 - 08/07/2014 Chemotherapy   Continue Revlimid at 2.5 mg 3 weeks on 1 week off   08/08/2014 - 08/13/2014 Hospital Admission   Hospital admission for pneumonia. Her Revlimid was held.   08/29/2014 - 09/14/2014 Chemotherapy   Maintenance Revlimid restarted   09/14/2014 - 09/17/2014 Hospital Admission   [place].   11/13/2014 - 12/25/2015  Chemotherapy   Maintenance Revlimid restarted, changed to 2.28m daily, 2 weks on, 1 week off from 12/15/2014, stopped due to disease progression    01/24/2016 Progression   Patient is M protein has gradually increased to 1.0g, repeated a bone marrow biopsy showed plasma cell 10-30%   02/27/2016 - 03/07/2016 Chemotherapy   Pomalidomide 4 mg daily on day  1-21, dexamethasone 40 mg on day 1, 8 and 15, every 28 days, started on 02/27/2016, daratumumab weekly started on 03/07/2016, held after first dose, due to hospitalization and a severe pancytopenia, pomalidomide was stopped afterwards    03/10/2016 - 03/29/2016 Hospital Admission   Pt was admitted for sepsis from pneumonia, and severe pancytopenia. She required ICU stay for a few days due to hypotension, developed b/l pleural effusion required diuretics and b/l thoracentesis. She also required blood and plt transfusion, and prolonged neupogen injection for severe neutropenia. She was discharged home after 3 weeks hospital stay    04/26/2016 - 04/26/2019 Chemotherapy   Daratumumab restarted on 04/26/2016, Velcade 1.325mm2 and dexa 2047meekly start on 11/3, velcade held since 06/28/2016 due to CHF, dexa stopped on12/11/2017 due to high risk of fracture.  Dara reduced to every 4 weeks due to COVID-19 starting 10/23/18. Returned to every 3 weeks starting 12/17/18. Restarted every 2 weeks starting 02/22/19 Stopped on 04/26/19 due to disease progression.    08/26/2016 Echocardiogram   - Left ventricle: LVEF is approximately 35% with diffuse diffuse   hypokinesis with severe hypokinesis/ akinesis of the   inferior/inferoseptal walls. In comparison to echo images from   December 2017, there does not appear to be significant change The   cavity size was normal. Wall thickness was normal. Doppler   parameters are consistent with abnormal left ventricular   relaxation (grade 1 diastolic dysfunction). - Aortic valve: AV is thickened. 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 Present in echo from December 2017 There was   trivial regurgitation. - Right ventricle: Systolic function was mildly reduced. - Right atrium: The atrium was mildly dilated. - Pericardium, extracardiac: A trivial pericardial effusion was   identified.   12/03/2016  Echocardiogram   -Left Ventricle: Systolic function was   mildly to moderately reduced. The estimated ejection fraction was   in the range of 40% to 45%. Diffuse hypokinesis. Doppler   parameters are consistent with abnormal left ventricular   relaxation (grade 1 diastolic dysfunction). Doppler parameters   are consistent with indeterminate ventricular filling pressure. - Left atrium: The atrium was severely dilated. - Tricuspid valve: There was trivial regurgitation. - Pulmonary arteries: Systolic pressure was within the normal   range. PA peak pressure: 33 mm Hg (S). - Global longitudinal strain -10.5% (abnormal)   03/12/2017 Echocardiogram   ECHO 03/12/17 Study Conclusions - Left ventricle: The cavity size was normal. Wall thickness was   increased in a pattern of mild LVH. Systolic function was normal.   The estimated ejection fraction was in the range of 50% to 55%.   There is hypokinesis of the basalinferior myocardium. Doppler   parameters are consistent with abnormal left ventricular   relaxation (grade 1 diastolic dysfunction). - Aortic valve: There was trivial regurgitation. - Mitral valve: Calcified annulus. Mildly thickened leaflets .   There was trivial regurgitation. - Tricuspid valve: There was mild regurgitation. Impressions: - There has been mild improvement in EF since prior study.    12/12/2017 - 12/15/2017 Hospital Admission   Admission diagnosis: Pnuemonia  and acute hypoxic respiratory failure Additional comments: treated for pneumoania and acute hypoxic respiratory failure before she was discharged on 12/15/17 with oral anitbiotics. She did not require home oxygen.    12/30/2017 - 01/02/2018 Hospital Admission   Admission diagnosis: Pnuemonia  Additional comments: She was again hospitalized on 12/30/17 for pneumonia, ID work up was otherwise negative, she was discharged home with oral antibiotics.    04/01/2018 Imaging   04/01/2018 CXR IMPRESSION: Persisting  airspace disease superimposed on chronic changes, suggesting pneumonia given the history. Followup PA and lateral chest X-ray is recommended in 3-4 weeks following therapy to ensure resolution and exclude underlying malignancy.  Redemonstration of mid and lower thoracic compression fractures with associated thoracic kyphosis.   04/23/2018 Echocardiogram   04/23/2018 ECHO LV EF: 45% -   50%   01/07/2019 Imaging   MRI Brain 01/07/19 IMPRESSION: MRI HEAD IMPRESSION:   1. 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 Lesion closely approximates the left inner ear structures and involves the left Meckel's cave and left fifth cranial nerve, and most likely is the causative etiology for patient's underlying left ear and facial symptoms. Follow-up examination with postcontrast imaging suggested for complete evaluation. Inclusion of IAC sequences would likely be helpful for visualization. 2. Additional 2.7 cm lesion involving the right occipital calvarium with adjacent marrow signal abnormality, also likely related to multiple myeloma. Metastatic disease would be the primary differential consideration. 3. Underlying age-appropriate cerebral atrophy with mild chronic microvascular ischemic disease. No other acute intracranial abnormality.   MRA HEAD IMPRESSION:   1. Negative intracranial MRA with no large vessel occlusion, hemodynamically significant stenosis, or other acute vascular abnormality. 2. Petrous and cavernous left ICA is partially encased and surrounded by the left petrous apex lesion without significant irregularity or stenosis. 3. 2 mm right paraophthalmic aneurysm.   MRA NECK IMPRESSION:   Normal MRA of the neck with wide patency of the carotid and vertebral arteries bilaterally. No hemodynamically significant stenosis or other acute  vascular abnormality identified.    01/14/2019 - 01/27/2019 Radiation Therapy   By Dr. Tammi Klippel.    Diagnosis:   79 yo woman with skull base myeloma lesion causing left ear pain      Indication for treatment:  Palliative        Radiation treatment dates:   01/14/2019 - 01/27/2019   Site/dose:   Skull Base / 20 Gy in 10 fractions   Beams/energy:   3D, photons / 10X, 6X   04/01/2019 Pathology Results   Bone Marrow biopsy 04/01/19  DIAGNOSIS: BONE MARROW, ASPIRATE, CLOT, CORE: - Normocellular marrow with minimal involvement by plasma cell neoplasm - See comment PERIPHERAL BLOOD: - Macrocytic anemia and leukopenia -See complete blood count   04/09/2019 PET scan   PET 04/09/19  IMPRESSION: 1. Scattered lytic lesions of bone. A few of these demonstrate hypermetabolic activity, most notably the right occipital bone lesion; the lytic lesion in the approximately C7 spinous process; the lytic expansile lesion of the left fourth rib anteriorly; and a small lytic focus in the right femoral head. Other scattered lytic lesions have only low-grade activity. 2. Multiple fractures as detailed above. 3. Other imaging findings of potential clinical significance: Aortic Atherosclerosis (ICD10-I70.0). Coronary atherosclerosis.   05/10/2019 -  Chemotherapy   -CyBorD (cyclophosphamide (CYTOXAN) 535m (3051mm2), Velcade 1.21m47m2, and dexa 28m41meekly) starting 05/24/19.   -Due to neuropathy after 2 doses  of Velcade, I switched to weekly Kyprolis 3 weeks on/1 week off on 06/14/19.   -Chemo held for RT 07/05/19-07/22/19.   -Dexa stopped and Cytoxan dose reduced to 400 mg on day 1, 8 and 15 every 28 days, from cycle 3.     07/05/2019 - 07/20/2019 Radiation Therapy   By Dr. Tammi Klippel   Diagnosis:   79 yo woman with painful right occipital and left hip involvement by multiple myeloma      Indication for treatment:  Palliation        Radiation treatment dates:   07/05/19-07/20/19   Site/dose:    1.  The  Right Occipital skull lesion was treated to 20 Gy in 20 fractions of 2 Gy 2.  The Left Hip lesion was treated to 20 Gy in 20 fractions of 2 Gy   Beams/energy:      1.  The Right Occipital skull lesion was treated using a 3-field 3D set-up 2.  The Left Hip lesion was treated anterior and posterior fields.      CURRENT THERAPY:  -CyBorD (cyclophosphamide(CYTOXAN)555m (3072mm2),Velcade 1.27m60m2, and dexa 27m67meekly) starting 05/24/19. -Due toneuropathyafter 2 doses of Velcade, I switched toweeklyKyprolis 3 weeks on/1 week off on 06/14/19.Reduced to 2 weeks on/1 week off starting 09/16/19 due to thrombocytopenia.  -Chemo heldfor RT 07/05/19-07/22/19. -Dexareduced to 27mg22mytoxan dose reduced to 400 mg on day 1, 8 and 15 every 28 days, from cycle 3on 08/19/19, Cytoxan reduced to 300mgo67my 1, 8 every 21 daysafter 09/02/19. -Reduce regimen to every 2 weeks starting with C8 on 12/09/19 -Restart Zometa injections every6 weekson 06/21/19.  INTERVAL HISTORY:  ClaireROMANITA FAGERre for her LE edema. She presents to the clinic alone. She notes in the past 3-4 days her right leg edema worsened and now larger than her left. She notes she tried elevating her legs which did not help. Her doppler today was negative for DVT. She notes she tried compression socks at home but her swelling will still occur above it. She denies fever. She notes she [place] [facility].  She notes she has had a contact in her right eye for 2 weeks because she found it hard to remove.     REVIEW OF SYSTEMS:   Constitutional: Denies fevers, chills or abnormal weight loss Eyes: Denies blurriness of vision Ears, nose, mouth, throat, and face: Denies mucositis or sore throat Respiratory: Denies cough, dyspnea or wheezes Cardiovascular: Denies palpitation, chest discomfort (+) lower extremity  swelling, R>L Gastrointestinal:  Denies nausea, heartburn or change in bowel habits Skin: Denies abnormal skin rashes Lymphatics: Denies new lymphadenopathy or easy bruising Neurological:Denies numbness, tingling or new weaknesses Behavioral/Psych: Mood is stable, no new changes  All other systems were reviewed with the patient and are negative.  MEDICAL HISTORY:  Past Medical History:  Diagnosis Date  . CAD (coronary artery disease)    [place] [facility] [place] [facility] mid to dist RCA 50% >>> med rx  . Cardiomyopathy (HCC)  [place]   . Chronic combined systolic and diastolic CHF (congestive heart failure) (HCC)  [place]   . Cord compression (HCC) [place]13   MRI- diffuse myeloma involvement of T-L spine  . History of radiation therapy 07/13/12-07/27/12   spinal cord compression T3-T10,left scapula  . 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  Multiple myeloma ([place]) 07/01/2012  . MVP (mitral valve prolapse)    a. trivial by echo 06/2017.  . OSTEOPOROSIS 06/11/2010   Multiple compression fractures; and spontaneous fracture of sternum Qualifier: Diagnosis of  By: [name], [name]     . Thoracic kyphosis 07/13/12   per MRI scan  . Unspecified deficiency anemia     SURGICAL HISTORY: Past Surgical History:  Procedure Laterality Date  . APPENDECTOMY    . CATARACT EXTRACTION, BILATERAL    . CESAREAN SECTION     x2   . COLONOSCOPY  2007   neg with Dr. [name]  . ELBOW SURGERY    . HIP SURGERY  2009   left  . TUBAL LIGATION      I have reviewed the social history and family history with the patient and they are unchanged from previous note.  ALLERGIES:  is allergic to zithromax [azithromycin], zosyn [piperacillin sod-tazobactam so], and quinolones.  MEDICATIONS:  Current Outpatient Medications  Medication Sig Dispense Refill  . acetaminophen (TYLENOL) 500 MG tablet Take 500 mg by mouth every 6  (six) hours as needed for moderate pain or fever.    Marland Kitchen acyclovir (ZOVIRAX) 400 MG tablet Take 1 tablet (400 mg total) by mouth 2 (two) times daily. 180 tablet 3  . aspirin EC 81 MG tablet Take 1 tablet (81 mg total) by mouth daily. 90 tablet 3  . atorvastatin (LIPITOR) 20 MG tablet TAKE 1 TABLET BY MOUTH  DAILY 90 tablet 3  . calcium-vitamin D (OSCAL WITH D) 500-200 MG-UNIT TABS tablet Take by mouth.    . cyclophosphamide (CYTOXAN) 50 MG capsule Take 6 capsules (340m) by mouth once every other week. 12 capsule 1  . doxycycline (VIBRA-TABS) 100 MG tablet Take 100 mg by mouth as needed (treatment for pneumonia).     . metoprolol succinate (TOPROL-XL) 25 MG 24 hr tablet TAKE 1 TABLET BY MOUTH  DAILY 90 tablet 3  . morphine (MSIR) 15 MG tablet Take 0.5 tablets (7.5 mg total) by mouth every 8 (eight) hours as needed for severe pain. 20 tablet 0  . ondansetron (ZOFRAN) 8 MG tablet Take 1 tablet (8 mg total) by mouth every 8 (eight) hours as needed for nausea or vomiting. 20 tablet 3  . prochlorperazine (COMPAZINE) 10 MG tablet Take 1 tablet (10 mg total) by mouth every 6 (six) hours as needed (Nausea or vomiting). 30 tablet 1  . triamcinolone cream (KENALOG) 0.1 % Apply 0.1 application topically 2 (two) times daily as needed.    . furosemide (LASIX) 20 MG tablet Take 1 tablet (20 mg total) by mouth daily. For 5 days then as needed 10 tablet 0  . potassium chloride SA (KLOR-CON) 20 MEQ tablet Take 1 tablet (20 mEq total) by mouth 2 (two) times daily. 10 tablet 0   No current facility-administered medications for this visit.    PHYSICAL EXAMINATION: ECOG PERFORMANCE STATUS: 2 - Symptomatic, <50% confined to bed  Vitals:   02/11/20 1058  BP: 108/72  Pulse: 88  Resp: 18  Temp: 98.1 F (36.7 C)  SpO2: 96%   Filed Weights   02/11/20 1058  Weight: 118 lb 6.4 oz (53.7 kg)    GENERAL:alert, no distress and comfortable SKIN: skin color, texture, turgor are normal, no rashes or significant  lesions EYES: normal, Conjunctiva are pink and non-injected, sclera clear  NECK: supple, thyroid normal size, non-tender, without nodularity LYMPH:  no palpable lymphadenopathy in the cervical, axillary  LUNGS: clear to auscultation and  percussion with normal breathing effort HEART: regular rate & rhythm and no murmurs (+)  lower extremity edema, R>L ABDOMEN:abdomen soft, non-tender and normal bowel sounds Musculoskeletal:no cyanosis of digits and no clubbing  NEURO: alert & oriented x 3 with fluent speech, no focal motor/sensory deficits  LABORATORY DATA:  I have reviewed the data as listed CBC Latest Ref Rng & Units 02/04/2020 01/20/2020 01/06/2020  WBC 4.0 - 10.5 K/uL 3.2(L) 3.1(L) 3.2(L)  Hemoglobin 12.0 - 15.0 g/dL 9.8(L) 8.8(L) 9.1(L)  Hematocrit 36 - 46 % 29.8(L) 27.1(L) 27.5(L)  Platelets 150 - 400 K/uL 120(L) 162 167     CMP Latest Ref Rng & Units 02/04/2020 01/20/2020 01/06/2020  Glucose 70 - 99 mg/dL 87 86 98  BUN 8 - 23 mg/dL 14 18 17   Creatinine 0.44 - 1.00 mg/dL 0.64 0.79 0.81  Sodium 135 - 145 mmol/L 141 142 138  Potassium 3.5 - 5.1 mmol/L 3.8 4.1 4.0  Chloride 98 - 111 mmol/L 108 111 109  CO2 22 - 32 mmol/L 23 20(L) 20(L)  Calcium 8.9 - 10.3 mg/dL 9.1 9.2 9.0  Total Protein 6.5 - 8.1 g/dL - 6.4(L) 5.9(L)  Total Bilirubin 0.3 - 1.2 mg/dL - 1.1 0.8  Alkaline Phos 38 - 126 U/L - 50 53  AST 15 - 41 U/L - 20 16  ALT 0 - 44 U/L - 16 13      RADIOGRAPHIC STUDIES: I have personally reviewed the radiological images as listed and agreed with the findings in the report. VAS [place] LOWER EXTREMITY VENOUS (DVT)  Result Date: 02/11/2020  Lower Venous DVTStudy Indications: Swelling, and Pain.  Comparison Study: 08/25/14 previous Performing Technologist: Abram Sander RVS  Examination Guidelines: A complete evaluation includes B-mode imaging, spectral Doppler, color Doppler, and power Doppler as needed of all accessible portions of each vessel. Bilateral testing is considered an integral  part of a complete examination. Limited examinations for reoccurring indications may be performed as noted. The reflux portion of the exam is performed with the patient in reverse Trendelenburg.  +---------+---------------+---------+-----------+----------+--------------+ RIGHT    CompressibilityPhasicitySpontaneityPropertiesThrombus Aging +---------+---------------+---------+-----------+----------+--------------+ CFV      Full           Yes      Yes                                 +---------+---------------+---------+-----------+----------+--------------+ SFJ      Full                                                        +---------+---------------+---------+-----------+----------+--------------+ FV Prox  Full                                                        +---------+---------------+---------+-----------+----------+--------------+ FV Mid   Full                                                        +---------+---------------+---------+-----------+----------+--------------+ FV  DistalFull                                                        +---------+---------------+---------+-----------+----------+--------------+ PFV      Full                                                        +---------+---------------+---------+-----------+----------+--------------+ POP      Full           Yes      Yes                                 +---------+---------------+---------+-----------+----------+--------------+ PTV      Full                                                        +---------+---------------+---------+-----------+----------+--------------+ PERO     Full                                                        +---------+---------------+---------+-----------+----------+--------------+   +----+---------------+---------+-----------+----------+--------------+ LEFTCompressibilityPhasicitySpontaneityPropertiesThrombus Aging  +----+---------------+---------+-----------+----------+--------------+ CFV Full           Yes      Yes                                 +----+---------------+---------+-----------+----------+--------------+     Summary: RIGHT: - There is no evidence of deep vein thrombosis in the lower extremity.  - No cystic structure found in the popliteal fossa.  LEFT: - No evidence of common femoral vein obstruction.  *See table(s) above for measurements and observations.    Preliminary      ASSESSMENT & PLAN:  Shannon Rush is a 79 y.o. female with    1. right LE edema, recent Left leg cellulitis and swelling  -She was recently hospitalized in [facility] for left lower extremity cellulitis, the leg edema has much improved. -She plans to complete Ceftriaxone today (02/11/20) and complete Vancomycin tomorrow. She still has Central line that was placed at [facility] and would like to have it removed in [place].  I will schedule for her next week -In the last 3-4 days her right leg edema increased, now larger than her left. Elevating legs did not help. Wearing compression socks lead to swelling above her socks.  -She denies Right leg injury or fever or SOB. Her right lower extremity is moderately edematous, with no significant skin erythema or warmness. No clinical signs of cellulitis  -Today's Doppler on right lower extremity was negative for DVT -No other clinical signs of heart failure on exam -Possible related to lower extremity venous stasis, or fluids leakage due to low protein level  -I will call in Lasix to take  for 5 days (02/11/20) to help her edema and reduce fluid retention. I advised her to continue elevating legs and using compression socks. If she has polyuria she can reduce Lasix dose to half.  -F/u next week    2. IgG kappa Multiple myeloma s/p Auto SCT, intermediate risk, relapsed in 7/2017and progressed again in Oct 2020 -Diagnosed in 05/2012. Treated with induction chemo, radiation and BM  transplant, but unfortunately progressed while on maintenance Revlimid 01/2016. She is currently onDaratumumabinfusion monotherapy.  -PETimagesfrom 04/09/19 showedScattered lytic lesions of bone. A few of these demonstrate hypermetabolic activity, most notably the right occipital bone lesion, C7 spinous process, Left 4th rib, andright femoral head.  -Herbone marrow biopsy from 04/02/19 showsnormocellular marrow with minimal involvement by plasma cell neoplasm(2%). -Unfortunately she has progressedpreviouslywith increased M-protein and more symptomatic bone pain from MM, s/p palliative radiation to left hip and right scalp -Based on Dr. Noah Delaine recommendation I changedher treatment to CyBorD (oral cyclophosphamide 574m, Velcade 1.313mm2, and dexa 2047mweekly) started on11/9/20.After 2 doses of Velcade, I switched toweeklyKyprolis3 weeks on/1 week offstarting 06/14/19 due to neuropathy.Chemo waspreviouslyheld forpalliative RT.Cytoxan has been dose reduced and held as needed due to thrombocytopenia. Kyprolis reduced to 2 weeks on/1 week off starting 09/16/19 due to thrombocytopenia. Her M-protein has reduced to 0.1in the past two months.Wede-escalatedher chemo to every 2 weeks starting with C8 on 12/09/19. -Treatment held after 01/20/20 due to recent LE skin infection. She is being treated with IV Antibiotics. Plans to complete this week (02/11/20).     Plan -I called in Lasix and KCL today  -Continue IV Antibiotics at home -Continue to hold chemo treatment  -Lab, f/u and Chemo in 1 week   No problem-specific Assessment & Plan notes found for this encounter.   No orders of the defined types were placed in this encounter.  All questions were answered. The patient knows to call the clinic with any problems, questions or concerns. No barriers to learning was detected. The total time spent in the appointment was 25 minutes.     YanTruitt MerleD 02/11/2020   I, AmoJoslyn Devonm acting as scribe for YanTruitt MerleD.   I have reviewed the above documentation for accuracy and completeness, and I agree with the above.

## 2020-02-11 ENCOUNTER — Inpatient Hospital Stay: Payer: Medicare Other | Admitting: Hematology

## 2020-02-11 ENCOUNTER — Encounter: Payer: Self-pay | Admitting: Hematology

## 2020-02-11 ENCOUNTER — Other Ambulatory Visit: Payer: Self-pay

## 2020-02-11 ENCOUNTER — Ambulatory Visit (HOSPITAL_COMMUNITY)
Admission: RE | Admit: 2020-02-11 | Discharge: 2020-02-11 | Disposition: A | Discharge status: Home / Self Care | Payer: Medicare Other | Source: Ambulatory Visit | Attending: Hematology | Admitting: Hematology

## 2020-02-11 VITALS — BP 108/72 | HR 88 | Temp 98.1°F | Resp 18 | Ht 66.5 in | Wt 118.4 lb

## 2020-02-11 DIAGNOSIS — L03116 Cellulitis of left lower limb: Secondary | ICD-10-CM | POA: Diagnosis not present

## 2020-02-11 DIAGNOSIS — Z9481 Bone marrow transplant status: Secondary | ICD-10-CM | POA: Diagnosis not present

## 2020-02-11 DIAGNOSIS — G629 Polyneuropathy, unspecified: Secondary | ICD-10-CM | POA: Diagnosis not present

## 2020-02-11 DIAGNOSIS — M7989 Other specified soft tissue disorders: Secondary | ICD-10-CM | POA: Diagnosis not present

## 2020-02-11 DIAGNOSIS — D6481 Anemia due to antineoplastic chemotherapy: Secondary | ICD-10-CM

## 2020-02-11 DIAGNOSIS — E785 Hyperlipidemia, unspecified: Secondary | ICD-10-CM | POA: Diagnosis not present

## 2020-02-11 DIAGNOSIS — I2510 Atherosclerotic heart disease of native coronary artery without angina pectoris: Secondary | ICD-10-CM | POA: Diagnosis not present

## 2020-02-11 DIAGNOSIS — Z79899 Other long term (current) drug therapy: Secondary | ICD-10-CM | POA: Diagnosis not present

## 2020-02-11 DIAGNOSIS — C9002 Multiple myeloma in relapse: Secondary | ICD-10-CM | POA: Insufficient documentation

## 2020-02-11 DIAGNOSIS — Z9221 Personal history of antineoplastic chemotherapy: Secondary | ICD-10-CM | POA: Diagnosis not present

## 2020-02-11 DIAGNOSIS — I252 Old myocardial infarction: Secondary | ICD-10-CM | POA: Diagnosis not present

## 2020-02-11 DIAGNOSIS — T451X5A Adverse effect of antineoplastic and immunosuppressive drugs, initial encounter: Secondary | ICD-10-CM

## 2020-02-11 DIAGNOSIS — Z923 Personal history of irradiation: Secondary | ICD-10-CM | POA: Diagnosis not present

## 2020-02-11 DIAGNOSIS — Z5112 Encounter for antineoplastic immunotherapy: Secondary | ICD-10-CM | POA: Diagnosis not present

## 2020-02-11 DIAGNOSIS — Z7982 Long term (current) use of aspirin: Secondary | ICD-10-CM | POA: Diagnosis not present

## 2020-02-11 MED ORDER — FUROSEMIDE 20 MG PO TABS: 20.0000 mg | ORAL_TABLET | Freq: Every day | ORAL | 0 refills | Status: AC

## 2020-02-11 MED ORDER — POTASSIUM CHLORIDE CRYS ER 20 MEQ PO TBCR
20.0000 meq | EXTENDED_RELEASE_TABLET | Freq: Two times a day (BID) | ORAL | 0 refills | Status: DC
Start: 2020-02-11 — End: 2020-03-15

## 2020-02-11 NOTE — Progress Notes (Signed)
Lower extremity venous has been completed.   Preliminary results in CV Proc.   Abram Sander 02/11/2020 10:38 AM

## 2020-02-12 ENCOUNTER — Encounter: Payer: Self-pay | Admitting: Hematology

## 2020-02-14 DIAGNOSIS — L03116 Cellulitis of left lower limb: Secondary | ICD-10-CM | POA: Diagnosis not present

## 2020-02-14 NOTE — Progress Notes (Signed)
[place]   [phone] [phone]   Clinic Follow up Note   Patient Care Team: [name], MD as PCP - General (Family Medicine) [name], MD as PCP - Cardiology (Cardiology) [name], MD as Attending Physician (Obstetrics and Gynecology) [name], MD as Consulting Physician (Internal Medicine) [name], MD as Consulting Physician (Hematology) [name], MD as Consulting Physician (Ophthalmology) [name], MD as Consulting Physician (Cardiology) [name], MD as Consulting Physician (Dermatology) [name] (Dentistry) [name], DPM as Consulting Physician (Podiatry) Lazaro Arms, RN as [place] [facility] Lazaro Arms, RN as Case Manager  Date of Service:  02/17/2020  CHIEF COMPLAINT: F/u of MM and right LE edema   SUMMARY OF ONCOLOGIC HISTORY: Oncology History Overview Note  Multiple myeloma [facility])   Staging form: Multiple Myeloma, AJCC 6th Edition   - Clinical: Stage IIIA - Signed by [name], MD on 09/04/2013    Multiple myeloma ([place])  05/26/2012 Initial Diagnosis   Presenting IgG was 4,040 mg/dL on 06/05/2012 (IgA 35; IgM 34); kappa free light chain 34.4 mg/dL, lambda 0.00, kappa:lambda ratio of 34.75; SPEP with M-spike of 2.60.   06/30/2012 Imaging   Numerous lytic lesions throughout the calvarium.  Lytic lesion within the left lateral scapula.  Findings most compatible with metastases or myeloma. Multiple mid and lower thoracic compression fractures.  Slight compression through the endplates at L3.   81/07/7508 Bone Marrow Biopsy   Bone marrow biopsy showed 49% plasma cell. Normal classical cytogenetics; however, myeloma FISH panel showed 13q- (intermediate risk)       07/14/2012 - 07/27/2012 Radiation Therapy   Palliative radiation 20 Gy over 10 fractions between to thoracic spine cord compression and symptomatic left scapula lytic  lesion.   08/10/2012 - 11/2012 Chemotherapy   Started SQ Velcade once weekly, 3 weeks on, 1 weeks off; daily Revlimid d1-21, 7 days off; and Dexamethasone 65m PO weekly.    02/12/2013 Bone Marrow Transplant   [facility]    06/14/2013 Tumor Marker   IgG  1830,  Kappa:lamba ratio, 1.69 (baseline was 34.75 or   M-spike 0.28 (baseline of 2.6 or  89.3% of baseline).     06/25/2013 -  Chemotherapy   Started zometa 3.5 mg monthly    09/01/2013 - 01/2014 Chemotherapy   Maintenance therapy with revlimid 568mdaily for 14 days and then off for 7 (decreased from 21 days on and 7 days off based on neutropenia). Stopped due to disease progression 01/2017   09/13/2013 - 09/17/2014 Hospital Admission   Hospital admission for pneumonia   12/20/2013 Treatment Plan Change   Maintenance therapy decreased to 2.5 mg daily for 21 days and then off for 7 days based on low counts.    01/25/2014 Tumor Marker   IgG 1360 M spike 0.5   03/01/2014 - 03/05/2014 Hospital Admission   [facility], received thrombolytic therapy, cath showed 60% prox RCA, mid-distal RCA 50%, Echo normal, no stents.   04/11/2014 - 08/07/2014 Chemotherapy   Continue Revlimid at 2.5 mg 3 weeks on 1 week off   08/08/2014 - 08/13/2014 Hospital Admission   Hospital admission for pneumonia. Her Revlimid was held.   08/29/2014 - 09/14/2014 Chemotherapy   Maintenance Revlimid restarted   09/14/2014 - 09/17/2014 Hospital Admission   [place].  11/13/2014 - 12/25/2015 Chemotherapy   Maintenance Revlimid restarted, changed to 2.46m daily, 2 weks on, 1 week off from 12/15/2014, stopped due to disease progression    01/24/2016 Progression   Patient is M protein has gradually increased to 1.0g, repeated a bone marrow biopsy showed plasma cell 10-30%   02/27/2016 - 03/07/2016 Chemotherapy   Pomalidomide 4 mg  daily on day 1-21, dexamethasone 40 mg on day 1, 8 and 15, every 28 days, started on 02/27/2016, daratumumab weekly started on 03/07/2016, held after first dose, due to hospitalization and a severe pancytopenia, pomalidomide was stopped afterwards    03/10/2016 - 03/29/2016 Hospital Admission   Pt was admitted for sepsis from pneumonia, and severe pancytopenia. She required ICU stay for a few days due to hypotension, developed b/l pleural effusion required diuretics and b/l thoracentesis. She also required blood and plt transfusion, and prolonged neupogen injection for severe neutropenia. She was discharged home after 3 weeks hospital stay    04/26/2016 - 04/26/2019 Chemotherapy   Daratumumab restarted on 04/26/2016, Velcade 1.348mm2 and dexa 2010meekly start on 11/3, velcade held since 06/28/2016 due to CHF, dexa stopped on12/11/2017 due to high risk of fracture.  Dara reduced to every 4 weeks due to COVID-19 starting 10/23/18. Returned to every 3 weeks starting 12/17/18. Restarted every 2 weeks starting 02/22/19 Stopped on 04/26/19 due to disease progression.    08/26/2016 Echocardiogram   - Left ventricle: LVEF is approximately 35% with diffuse diffuse   hypokinesis with severe hypokinesis/ akinesis of the   inferior/inferoseptal walls. In comparison to echo images from   December 2017, there does not appear to be significant change The   cavity size was normal. Wall thickness was normal. Doppler   parameters are consistent with abnormal left ventricular   relaxation (grade 1 diastolic dysfunction). - Aortic valve: AV is thickened. 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 Present in echo from December 2017 There was   trivial regurgitation. - Right ventricle: Systolic function was mildly reduced. - Right atrium: The atrium was mildly dilated. - Pericardium, extracardiac: A trivial pericardial effusion was   identified.   12/03/2016  Echocardiogram   -Left Ventricle: Systolic function was   mildly to moderately reduced. The estimated ejection fraction was   in the range of 40% to 45%. Diffuse hypokinesis. Doppler   parameters are consistent with abnormal left ventricular   relaxation (grade 1 diastolic dysfunction). Doppler parameters   are consistent with indeterminate ventricular filling pressure. - Left atrium: The atrium was severely dilated. - Tricuspid valve: There was trivial regurgitation. - Pulmonary arteries: Systolic pressure was within the normal   range. PA peak pressure: 33 mm Hg (S). - Global longitudinal strain -10.5% (abnormal)   03/12/2017 Echocardiogram   ECHO 03/12/17 Study Conclusions - Left ventricle: The cavity size was normal. Wall thickness was   increased in a pattern of mild LVH. Systolic function was normal.   The estimated ejection fraction was in the range of 50% to 55%.   There is hypokinesis of the basalinferior myocardium. Doppler   parameters are consistent with abnormal left ventricular   relaxation (grade 1 diastolic dysfunction). - Aortic valve: There was trivial regurgitation. - Mitral valve: Calcified annulus. Mildly thickened leaflets .   There was trivial regurgitation. - Tricuspid valve: There was mild regurgitation. Impressions: - There has been mild improvement in EF since prior study.    12/12/2017 - 12/15/2017 Hospital Admission  Admission diagnosis: Pnuemonia and acute hypoxic respiratory failure Additional comments: treated for pneumoania and acute hypoxic respiratory failure before she was discharged on 12/15/17 with oral anitbiotics. She did not require home oxygen.    12/30/2017 - 01/02/2018 Hospital Admission   Admission diagnosis: Pnuemonia  Additional comments: She was again hospitalized on 12/30/17 for pneumonia, ID work up was otherwise negative, she was discharged home with oral antibiotics.    04/01/2018 Imaging   04/01/2018 CXR IMPRESSION: Persisting  airspace disease superimposed on chronic changes, suggesting pneumonia given the history. Followup PA and lateral chest X-ray is recommended in 3-4 weeks following therapy to ensure resolution and exclude underlying malignancy.  Redemonstration of mid and lower thoracic compression fractures with associated thoracic kyphosis.   04/23/2018 Echocardiogram   04/23/2018 ECHO LV EF: 45% -   50%   01/07/2019 Imaging   MRI Brain 01/07/19 IMPRESSION: MRI HEAD IMPRESSION:   1. 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 Lesion closely approximates the left inner ear structures and involves the left Meckel's cave and left fifth cranial nerve, and most likely is the causative etiology for patient's underlying left ear and facial symptoms. Follow-up examination with postcontrast imaging suggested for complete evaluation. Inclusion of IAC sequences would likely be helpful for visualization. 2. Additional 2.7 cm lesion involving the right occipital calvarium with adjacent marrow signal abnormality, also likely related to multiple myeloma. Metastatic disease would be the primary differential consideration. 3. Underlying age-appropriate cerebral atrophy with mild chronic microvascular ischemic disease. No other acute intracranial abnormality.   MRA HEAD IMPRESSION:   1. Negative intracranial MRA with no large vessel occlusion, hemodynamically significant stenosis, or other acute vascular abnormality. 2. Petrous and cavernous left ICA is partially encased and surrounded by the left petrous apex lesion without significant irregularity or stenosis. 3. 2 mm right paraophthalmic aneurysm.   MRA NECK IMPRESSION:   Normal MRA of the neck with wide patency of the carotid and vertebral arteries bilaterally. No hemodynamically significant stenosis or other acute  vascular abnormality identified.    01/14/2019 - 01/27/2019 Radiation Therapy   By Dr. Tammi Klippel.    Diagnosis:   79 yo woman with skull base myeloma lesion causing left ear pain      Indication for treatment:  Palliative        Radiation treatment dates:   01/14/2019 - 01/27/2019   Site/dose:   Skull Base / 20 Gy in 10 fractions   Beams/energy:   3D, photons / 10X, 6X   04/01/2019 Pathology Results   Bone Marrow biopsy 04/01/19  DIAGNOSIS: BONE MARROW, ASPIRATE, CLOT, CORE: - Normocellular marrow with minimal involvement by plasma cell neoplasm - See comment PERIPHERAL BLOOD: - Macrocytic anemia and leukopenia -See complete blood count   04/09/2019 PET scan   PET 04/09/19  IMPRESSION: 1. Scattered lytic lesions of bone. A few of these demonstrate hypermetabolic activity, most notably the right occipital bone lesion; the lytic lesion in the approximately C7 spinous process; the lytic expansile lesion of the left fourth rib anteriorly; and a small lytic focus in the right femoral head. Other scattered lytic lesions have only low-grade activity. 2. Multiple fractures as detailed above. 3. Other imaging findings of potential clinical significance: Aortic Atherosclerosis (ICD10-I70.0). Coronary atherosclerosis.   05/10/2019 -  Chemotherapy   -CyBorD (cyclophosphamide (CYTOXAN) 591m (3039mm2), Velcade 1.31m57m2, and dexa 56m42meekly) starting 05/24/19.   -Due to neuropathy  after 2 doses of Velcade, I switched to weekly Kyprolis 3 weeks on/1 week off on 06/14/19.   -Chemo held for RT 07/05/19-07/22/19.   -Dexa stopped and Cytoxan dose reduced to 400 mg on day 1, 8 and 15 every 28 days, from cycle 3.     07/05/2019 - 07/20/2019 Radiation Therapy   By Dr. Tammi Klippel   Diagnosis:   79 yo woman with painful right occipital and left hip involvement by multiple myeloma      Indication for treatment:  Palliation        Radiation treatment dates:   07/05/19-07/20/19   Site/dose:    1.  The  Right Occipital skull lesion was treated to 20 Gy in 20 fractions of 2 Gy 2.  The Left Hip lesion was treated to 20 Gy in 20 fractions of 2 Gy   Beams/energy:      1.  The Right Occipital skull lesion was treated using a 3-field 3D set-up 2.  The Left Hip lesion was treated anterior and posterior fields.      CURRENT THERAPY:  -CyBorD (cyclophosphamide(CYTOXAN)'500mg'$  ('300mg'$ /m2),Velcade 1.'3mg'$ /m2, and dexa '20mg'$ , weekly) starting 05/24/19. -Due toneuropathyafter 2 doses of Velcade, I switched toweeklyKyprolis 3 weeks on/1 week off on 06/14/19.Reduced to 2 weeks on/1 week off starting 09/16/19 due to thrombocytopenia.  -Chemo heldfor RT 07/05/19-07/22/19. -Dexareduced to '20mg'$ andCytoxan dose reduced to 400 mg on day 1, 8 and 15 every 28 days, from cycle 3on 08/19/19, Cytoxan reduced to '300mg'$ on day 1, 8 every 21 daysafter 09/02/19. -Reduce regimen to every 2 weeks starting with C8 on 12/09/19. Increased to 2 weeks on/1 week off starting 03/02/20 -Restart Zometa injections every6 weekson 06/21/19.   INTERVAL HISTORY:  Shannon Rush is here for a follow up. She presents to the clinic alone. She notes she is doing better. She notes she was able to get her eye contact removed. She notes her swelling is improving on Lasix. She notes her PICC line was removed this week. She denies fever, chills, cough with phlegm, or worsening pain. Her pain is mostly stable. She notes she has occasional right occipital lobe pain. For her overall pain she will take 1/4 Morphine as needed. She notes she has 6 tablets of Cytoxan remaining.  She notes she plans to go to the [facility] for labor day weekend and end of September she plan to go to [place] for a wedding.     REVIEW OF SYSTEMS:   Constitutional: Denies fevers, chills or abnormal weight loss Eyes: Denies blurriness of vision Ears, nose, mouth, throat, and face: Denies mucositis or sore  throat Respiratory: Denies cough, dyspnea or wheezes Cardiovascular: Denies palpitation, chest discomfort (+) Improving lower extremity swelling, R>L Gastrointestinal:  Denies nausea, heartburn or change in bowel habits Skin: Denies abnormal skin rashes Lymphatics: Denies new lymphadenopathy or easy bruising Neurological:Denies numbness, tingling or new weaknesses Behavioral/Psych: Mood is stable, no new changes  All other systems were reviewed with the patient and are negative.  MEDICAL HISTORY:  Past Medical History:  Diagnosis Date  . CAD (coronary artery disease)    a. inf STEMI (in [place] >>> [facility]ytics) >>> LHC (8/15- [place]):  pRCA 60%, mid to dist RCA 50% >>> med rx  . Cardiomyopathy ([place])   . Cataract   . Chronic combined systolic and diastolic CHF (congestive heart failure) ([place])    cancer medication related   . Cord compression ([place]) 07/13/12   MRI- diffuse myeloma involvement of T-L  spine  . History of radiation therapy 07/13/12-07/27/12   spinal cord compression T3-T10,left scapula  . 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 Multiple myeloma ([place]) 07/01/2012  . MVP (mitral valve prolapse)    a. trivial by echo 06/2017.  . OSTEOPOROSIS 06/11/2010   Multiple compression fractures; and spontaneous fracture of sternum Qualifier: Diagnosis of  By: [name], [name]     . Thoracic kyphosis 07/13/12   per MRI scan  . Unspecified deficiency anemia     SURGICAL HISTORY: Past Surgical History:  Procedure Laterality Date  . APPENDECTOMY    . CATARACT EXTRACTION, BILATERAL    . CESAREAN SECTION     x2   . COLONOSCOPY  2007   neg with Dr. [name]  . ELBOW SURGERY    . HIP SURGERY  2009   left  . IR REMOVAL TUN CV CATH W/O FL  02/15/2020  . TUBAL LIGATION      I have reviewed the social history and family history with the patient and they are unchanged from previous note.  ALLERGIES:  is allergic to zithromax [azithromycin], zosyn [piperacillin  sod-tazobactam so], and quinolones.  MEDICATIONS:  Current Outpatient Medications  Medication Sig Dispense Refill  . acetaminophen (TYLENOL) 500 MG tablet Take 500 mg by mouth every 6 (six) hours as needed for moderate pain or fever.    Marland Kitchen acyclovir (ZOVIRAX) 400 MG tablet Take 1 tablet (400 mg total) by mouth 2 (two) times daily. 180 tablet 3  . aspirin EC 81 MG tablet Take 1 tablet (81 mg total) by mouth daily. 90 tablet 3  . atorvastatin (LIPITOR) 20 MG tablet TAKE 1 TABLET BY MOUTH  DAILY 90 tablet 3  . calcium-vitamin D (OSCAL WITH D) 500-200 MG-UNIT TABS tablet Take by mouth.    . cyclophosphamide (CYTOXAN) 50 MG capsule Take 6 capsules ('300mg'$ ) by mouth once weekly on day of chemo for 2 weeks, then off for one week 24 capsule 1  . doxycycline (VIBRA-TABS) 100 MG tablet Take 100 mg by mouth as needed (treatment for pneumonia).     . furosemide (LASIX) 20 MG tablet Take 1 tablet (20 mg total) by mouth daily. For 5 days then as needed 10 tablet 0  . metoprolol succinate (TOPROL-XL) 25 MG 24 hr tablet TAKE 1 TABLET BY MOUTH  DAILY 90 tablet 3  . morphine (MSIR) 15 MG tablet Take 0.5 tablets (7.5 mg total) by mouth every 8 (eight) hours as needed for severe pain. 20 tablet 0  . ondansetron (ZOFRAN) 8 MG tablet Take 1 tablet (8 mg total) by mouth every 8 (eight) hours as needed for nausea or vomiting. 20 tablet 3  . potassium chloride SA (KLOR-CON) 20 MEQ tablet Take 1 tablet (20 mEq total) by mouth 2 (two) times daily. 10 tablet 0  . prochlorperazine (COMPAZINE) 10 MG tablet Take 1 tablet (10 mg total) by mouth every 6 (six) hours as needed (Nausea or vomiting). 30 tablet 1  . triamcinolone cream (KENALOG) 0.1 % Apply 0.1 application topically 2 (two) times daily as needed.     No current facility-administered medications for this visit.    PHYSICAL EXAMINATION: ECOG PERFORMANCE STATUS: 2 - Symptomatic, <50% confined to bed  Vitals:   02/17/20 1040  BP: 112/61  Pulse: 76  Resp: 18   Temp: (!) 97.5 F (36.4 C)  SpO2: 100%   Filed Weights   02/17/20 1040  Weight: 112 lb 8 oz (51 kg)    Due to COVID19  we will limit examination to appearance. Patient had no complaints.  GENERAL:alert, no distress and comfortable SKIN: skin color normal, no rashes or significant lesions EYES: normal, Conjunctiva are pink and non-injected, sclera clear  NEURO: alert & oriented x 3 with fluent speech  (+) Improved LE edema, R>L  LABORATORY DATA:  I have reviewed the data as listed CBC Latest Ref Rng & Units 02/17/2020 02/04/2020 01/20/2020  WBC 4.0 - 10.5 K/uL 3.0(L) 3.2(L) 3.1(L)  Hemoglobin 12.0 - 15.0 g/dL 9.2(L) 9.8(L) 8.8(L)  Hematocrit 36 - 46 % 28.0(L) 29.8(L) 27.1(L)  Platelets 150 - 400 K/uL 140(L) 120(L) 162     CMP Latest Ref Rng & Units 02/17/2020 02/04/2020 01/20/2020  Glucose 70 - 99 mg/dL 98 87 86  BUN 8 - 23 mg/dL _0 Creatinine 0.44 - 1.00 mg/dL 0.77 0.64 0.79  Sodium 135 - 145 mmol/L 137 141 142  Potassium 3.5 - 5.1 mmol/L 3.7 3.8 4.1  Chloride 98 - 111 mmol/L 105 108 111  CO2 22 - 32 mmol/L 22 23 20(L)  Calcium 8.9 - 10.3 mg/dL 9.5 9.1 9.2  Total Protein 6.5 - 8.1 g/dL 6.3(L) - 6.4(L)  Total Bilirubin 0.3 - 1.2 mg/dL 1.0 - 1.1  Alkaline Phos 38 - 126 U/L 101 - 50  AST 15 - 41 U/L 20 - 20  ALT 0 - 44 U/L 14 - 16      RADIOGRAPHIC STUDIES: I have personally reviewed the radiological images as listed and agreed with the findings in the report. No results found.   ASSESSMENT & PLAN:  Shannon Rush is a 79 y.o. female with    1.right LE edema, recent Left leg cellulitisand swelling  -She was recently hospitalized in [place] on 01/27/20 for left lower extremity cellulitis, the leg edema has much improved. -she lately developed right leg edema, 02/11/20 Doppler on right lower extremity was negative for DVT. No other clinical signs of heart failure on exam -Her swelling is improving on Lasix. She completed antibiotics and PICC line removed.    2. IgG  kappa Multiple myeloma s/p Auto SCT, intermediate risk, relapsed in 7/2017and progressed again in Oct 2020 -Diagnosed in 05/2012. Treated with induction chemo, radiation and BM transplant, but unfortunately progressed while on maintenance Revlimid 01/2016. She is currently onDaratumumabinfusion monotherapy.  -PETimagesfrom 04/09/19 showedScattered lytic lesions of bone. A few of these demonstrate hypermetabolic activity, most notably the right occipital bone lesion, C7 spinous process, Left 4th rib, andright femoral head.  -Herbone marrow biopsy from 04/02/19 showsnormocellular marrow with minimal involvement by plasma cell neoplasm(2%). -Unfortunately she has progressedpreviouslywith increased M-protein and more symptomatic bone pain from MM, s/p palliative radiation to left hip and right scalp -Based on Dr. Noah Delaine recommendation I changedher treatment to CyBorD (oral cyclophosphamide 580m, Velcade 1.364mm2, and dexa 2034mweekly) started on11/9/20.After 2 doses of Velcade, I switched toweeklyKyprolis3 weeks on/1 week offstarting 06/14/19 due to neuropathy.Chemo waspreviouslyheld forpalliative RT.Cytoxan has been dose reduced and held as needed due to thrombocytopenia. Kyprolis reduced to 2 weeks on/1 week off starting 09/16/19 due to thrombocytopenia. Her M-protein has reduced to 0.1in the past two months.Wede-escalatedher chemo to every 2 weeks starting with C8 on 12/09/19. -Treatment held after 01/20/20 due to recent LE skin infection. She completed course of antibiotics  -Her 02/04/20 M-protein did increase to 0.3 given her decreased treatment. I recommend go back to chemo weekly X2 every 21 days from next cycle, she is agreeable -Labs reviewed and adequate to proceed with Kyprolis, Cytoxan,  Dexa today at same dose.  -Given increased M-Protein with reduced dose treatment, will increase back to 2 weeks on/1 week off starting in 2 weeks (03/02/20) -f/u in 2 weeks    3.  Left petrous apex bone lesion with hearing lossandRight OccipitalskullLesion  -Her MRI brain from 01/07/19 showedabnormal osseous lesion measuring up to approximately 5.9 cm centered at the left petrous apex. Thismost likely is the cause of her underlyingleft ear and balancesymptoms. -Given she is symptomatic, sheunderwent target RTto skull case myeloma lesionwith Dr Tammi Klippel 7/1-7/15/20. This is negative on9/25/20PET scan -Her PET from 04/09/19 shows lesion with hypermetabolic activity in this area concerning for MM progression here.She was symptomatic. -She recently completedRT to right occipital lesion per Dr. Manning12/21/20-07/20/19.Tolerated well.She has moderate hair loss at RT site. -Her ear fullness and altered balance has much improved, continue balance exercises  4. History of multiple pneumonia andbronchitis -She has had multiple episodes of pneumonia, required hospitalization,and bronchitis, required antibiotics -She has recovered well, will continue monitoring. I encouraged her to do spirometry,and exercise. -Her vaccines are up-to-date.She has received COVID vaccines  5. Pancytopenia -She mainly has anemia and Thrombocytopenia concerningwhich occurred with recent(03/2019) disease progression. -Did give bloodtransfusionon 06/04/19 -Has recurred with oral Cytoxan.  6. CHF, CAD, inferior STEMI in 02/2014, HLD -She is on ASA, BB, statin. She will continue to f/u with Dr. Meda Coffee  7. Osteoporosis with multiple fractures  -Last DEXA in 2015 showed worsening osteoporosis. She declined repeat DEXA.  -She had a fall induced fracture in 01/2018. -She was on Zometa for 4 years, stopped after April 2018  -I restarted herZometaq6weeksto help strengthen her boneon12/7/20. Will monitor her dental health -She was seen to have left periprosthetic hip fracture when she went to ED on 09/07/19.   8. Peripheral neuropathy in feet, G2 -Secondary to previous  chemo.Worsenedsince she started Velcade. Now she is onKyprolis since 11/20/21which cause less neuropathy. -She previously declined Gabapentin. This is stable and controlled.   9. Goal of care discussion  -She is full code now. Treatment goal is palliative.  10Steroid induced hyperglycemia -Secondary to steroids,her random blood glucose was 217on 06/07/19. Has much improved recently. -I encouraged her to drink plenty of water and reduce sugar and carbohydrate in diet.  -06/21/19 A1c at 6.3.   Plan -I refilled Cytoxan today  -Labs reviewed and adequate to proceed with Kyprolis, Cytoxan, Dexa today  -Lab, Kyprolis in 2, 3, 5, 6 weeks  -F/u in 2 and 5 weeks  -Zometa today    No problem-specific Assessment & Plan notes found for this encounter.   No orders of the defined types were placed in this encounter.  All questions were answered. The patient knows to call the clinic with any problems, questions or concerns. No barriers to learning was detected. The total time spent in the appointment was 30 minutes.     [name], MD 02/17/2020   I, Joslyn Devon, am acting as scribe for [name], MD.   I have reviewed the above documentation for accuracy and completeness, and I agree with the above.

## 2020-02-15 ENCOUNTER — Other Ambulatory Visit: Payer: Self-pay

## 2020-02-15 ENCOUNTER — Other Ambulatory Visit: Payer: Self-pay | Admitting: Hematology

## 2020-02-15 ENCOUNTER — Encounter: Payer: Self-pay | Admitting: Radiology

## 2020-02-15 ENCOUNTER — Ambulatory Visit (HOSPITAL_COMMUNITY)
Admission: RE | Admit: 2020-02-15 | Discharge: 2020-02-15 | Disposition: A | Discharge status: Home / Self Care | Payer: Medicare Other | Source: Ambulatory Visit | Attending: Hematology | Admitting: Hematology

## 2020-02-15 DIAGNOSIS — T451X5A Adverse effect of antineoplastic and immunosuppressive drugs, initial encounter: Secondary | ICD-10-CM | POA: Insufficient documentation

## 2020-02-15 DIAGNOSIS — D6481 Anemia due to antineoplastic chemotherapy: Secondary | ICD-10-CM

## 2020-02-15 DIAGNOSIS — Z452 Encounter for adjustment and management of vascular access device: Secondary | ICD-10-CM | POA: Insufficient documentation

## 2020-02-15 HISTORY — PX: IR REMOVAL TUN CV CATH W/O FL: IMG2289

## 2020-02-15 MED ORDER — LIDOCAINE HCL 1 % IJ SOLN
INTRAMUSCULAR | Status: AC
  Filled 2020-02-15: qty 20

## 2020-02-15 NOTE — Procedures (Signed)
Pre procedural Dx: Infection   Successful removal of tunneled PICC catheter. Catheter removed intact   EBL: None No immediate complications.  Please see imaging section of Epic for full dictation.  Jacqualine Mau NP 02/15/2020 9:06 AM

## 2020-02-17 ENCOUNTER — Telehealth: Payer: Self-pay | Admitting: Hematology

## 2020-02-17 ENCOUNTER — Inpatient Hospital Stay: Payer: Medicare Other | Attending: Medical

## 2020-02-17 ENCOUNTER — Inpatient Hospital Stay (HOSPITAL_BASED_OUTPATIENT_CLINIC_OR_DEPARTMENT_OTHER): Payer: Medicare Other | Admitting: Hematology

## 2020-02-17 ENCOUNTER — Other Ambulatory Visit: Payer: Self-pay

## 2020-02-17 ENCOUNTER — Inpatient Hospital Stay: Payer: Medicare Other

## 2020-02-17 ENCOUNTER — Encounter: Payer: Self-pay | Admitting: Hematology

## 2020-02-17 VITALS — BP 112/61 | HR 76 | Temp 97.5°F | Resp 18 | Ht 66.5 in | Wt 112.5 lb

## 2020-02-17 DIAGNOSIS — Z95828 Presence of other vascular implants and grafts: Secondary | ICD-10-CM

## 2020-02-17 DIAGNOSIS — C9002 Multiple myeloma in relapse: Secondary | ICD-10-CM | POA: Diagnosis not present

## 2020-02-17 DIAGNOSIS — C9000 Multiple myeloma not having achieved remission: Secondary | ICD-10-CM

## 2020-02-17 DIAGNOSIS — M818 Other osteoporosis without current pathological fracture: Secondary | ICD-10-CM

## 2020-02-17 DIAGNOSIS — Z9221 Personal history of antineoplastic chemotherapy: Secondary | ICD-10-CM | POA: Insufficient documentation

## 2020-02-17 DIAGNOSIS — Z923 Personal history of irradiation: Secondary | ICD-10-CM | POA: Diagnosis not present

## 2020-02-17 DIAGNOSIS — Z5112 Encounter for antineoplastic immunotherapy: Secondary | ICD-10-CM | POA: Insufficient documentation

## 2020-02-17 DIAGNOSIS — I252 Old myocardial infarction: Secondary | ICD-10-CM

## 2020-02-17 DIAGNOSIS — Z79899 Other long term (current) drug therapy: Secondary | ICD-10-CM | POA: Diagnosis not present

## 2020-02-17 DIAGNOSIS — Z9481 Bone marrow transplant status: Secondary | ICD-10-CM | POA: Insufficient documentation

## 2020-02-17 DIAGNOSIS — Z7982 Long term (current) use of aspirin: Secondary | ICD-10-CM | POA: Insufficient documentation

## 2020-02-17 LAB — CBC WITH DIFFERENTIAL (CANCER CENTER ONLY)
Abs Immature Granulocytes: 0.08 10*3/uL — ABNORMAL HIGH (ref 0.00–0.07)
Basophils Absolute: 0 10*3/uL (ref 0.0–0.1)
Basophils Relative: 1 %
Eosinophils Absolute: 0.1 10*3/uL (ref 0.0–0.5)
Eosinophils Relative: 3 %
HCT: 28 % — ABNORMAL LOW (ref 36.0–46.0)
Hemoglobin: 9.2 g/dL — ABNORMAL LOW (ref 12.0–15.0)
Immature Granulocytes: 3 %
Lymphocytes Relative: 7 %
Lymphs Abs: 0.2 10*3/uL — ABNORMAL LOW (ref 0.7–4.0)
MCH: 34.6 pg — ABNORMAL HIGH (ref 26.0–34.0)
MCHC: 32.9 g/dL (ref 30.0–36.0)
MCV: 105.3 fL — ABNORMAL HIGH (ref 80.0–100.0)
Monocytes Absolute: 0.5 10*3/uL (ref 0.1–1.0)
Monocytes Relative: 17 %
Neutro Abs: 2.1 10*3/uL (ref 1.7–7.7)
Neutrophils Relative %: 69 %
Platelet Count: 140 10*3/uL — ABNORMAL LOW (ref 150–400)
RBC: 2.66 MIL/uL — ABNORMAL LOW (ref 3.87–5.11)
RDW: 17.3 % — ABNORMAL HIGH (ref 11.5–15.5)
WBC Count: 3 10*3/uL — ABNORMAL LOW (ref 4.0–10.5)
nRBC: 0 % (ref 0.0–0.2)

## 2020-02-17 LAB — CK TOTAL AND CKMB (NOT AT ARMC)
CK, MB: 2.6 ng/mL (ref 0.5–5.0)
Relative Index: INVALID (ref 0.0–2.5)
Total CK: 33 U/L — ABNORMAL LOW (ref 38–234)

## 2020-02-17 LAB — CMP (CANCER CENTER ONLY)
ALT: 14 U/L (ref 0–44)
AST: 20 U/L (ref 15–41)
Albumin: 3.7 g/dL (ref 3.5–5.0)
Alkaline Phosphatase: 101 U/L (ref 38–126)
Anion gap: 10 (ref 5–15)
BUN: 20 mg/dL (ref 8–23)
CO2: 22 mmol/L (ref 22–32)
Calcium: 9.5 mg/dL (ref 8.9–10.3)
Chloride: 105 mmol/L (ref 98–111)
Creatinine: 0.77 mg/dL (ref 0.44–1.00)
GFR, Est AFR Am: >60 mL/min (ref >60)
GFR, Estimated: >60 mL/min (ref >60)
Glucose, Bld: 98 mg/dL (ref 70–99)
Potassium: 3.7 mmol/L (ref 3.5–5.1)
Sodium: 137 mmol/L (ref 135–145)
Total Bilirubin: 1 mg/dL (ref 0.3–1.2)
Total Protein: 6.3 g/dL — ABNORMAL LOW (ref 6.5–8.1)

## 2020-02-17 MED ORDER — DEXAMETHASONE 4 MG PO TABS
ORAL_TABLET | ORAL | Status: AC
  Filled 2020-02-17: qty 5

## 2020-02-17 MED ORDER — SODIUM CHLORIDE 0.9 % IV SOLN
Freq: Once | INTRAVENOUS | Status: AC
  Filled 2020-02-17: qty 250

## 2020-02-17 MED ORDER — DEXTROSE 5 % IV SOLN
69.0000 mg/m2 | Freq: Once | INTRAVENOUS | Status: AC
  Administered 2020-02-17: 110 mg via INTRAVENOUS
  Filled 2020-02-17: qty 30

## 2020-02-17 MED ORDER — DEXAMETHASONE 4 MG PO TABS: 20.0000 mg | ORAL_TABLET | Freq: Once | ORAL | Status: DC

## 2020-02-17 MED ORDER — ZOLEDRONIC ACID 4 MG/5ML IV CONC
3.5000 mg | Freq: Once | INTRAVENOUS | Status: AC
  Administered 2020-02-17: 3.5 mg via INTRAVENOUS
  Filled 2020-02-17: qty 4.38

## 2020-02-17 MED ORDER — CYCLOPHOSPHAMIDE 50 MG PO CAPS: ORAL_CAPSULE | ORAL | 1 refills | Status: AC

## 2020-02-17 MED ORDER — PALONOSETRON HCL INJECTION 0.25 MG/5ML
INTRAVENOUS | Status: AC
  Filled 2020-02-17: qty 5

## 2020-02-17 MED ORDER — PALONOSETRON HCL INJECTION 0.25 MG/5ML
0.2500 mg | Freq: Once | INTRAVENOUS | Status: AC
  Administered 2020-02-17: 0.25 mg via INTRAVENOUS

## 2020-02-17 NOTE — Patient Instructions (Addendum)
[facility] Discharge Instructions for Patients Receiving Chemotherapy  Today you received the following chemotherapy agents Kyprolis  To help prevent nausea and vomiting after your treatment, we encourage you to take your nausea medication as directed.    If you develop nausea and vomiting that is not controlled by your nausea medication, call the clinic.   BELOW ARE SYMPTOMS THAT SHOULD BE REPORTED IMMEDIATELY:  *FEVER GREATER THAN 100.5 F  *CHILLS WITH OR WITHOUT FEVER  NAUSEA AND VOMITING THAT IS NOT CONTROLLED WITH YOUR NAUSEA MEDICATION  *UNUSUAL SHORTNESS OF BREATH  *UNUSUAL BRUISING OR BLEEDING  TENDERNESS IN MOUTH AND THROAT WITH OR WITHOUT PRESENCE OF ULCERS  *URINARY PROBLEMS  *BOWEL PROBLEMS  UNUSUAL RASH Items with * indicate a potential emergency and should be followed up as soon as possible.  Feel free to call the clinic should you have any questions or concerns. The clinic phone number is [phone].  Please show the CHEMO ALERT CARD at check-in to the Emergency Department and triage nurse.  Zoledronic Acid injection (Hypercalcemia, Oncology) What is this medicine? ZOLEDRONIC ACID (ZOE le dron ik AS id) lowers the amount of calcium loss from bone. It is used to treat too much calcium in your blood from cancer. It is also used to prevent complications of cancer that has spread to the bone. This medicine may be used for other purposes; ask your health care provider or pharmacist if you have questions. COMMON BRAND NAME(S): Zometa What should I tell my health care provider before I take this medicine? They need to know if you have any of these conditions:  aspirin-sensitive asthma  cancer, especially if you are receiving medicines used to treat cancer  dental disease or wear dentures  infection  kidney disease  receiving corticosteroids like dexamethasone or prednisone  an unusual or allergic reaction to zoledronic acid, other  medicines, foods, dyes, or preservatives  pregnant or trying to get pregnant  breast-feeding How should I use this medicine? This medicine is for infusion into a vein. It is given by a health care professional in a hospital or clinic setting. Talk to your pediatrician regarding the use of this medicine in children. Special care may be needed. Overdosage: If you think you have taken too much of this medicine contact a poison control center or emergency room at once. NOTE: This medicine is only for you. Do not share this medicine with others. What if I miss a dose? It is important not to miss your dose. Call your doctor or health care professional if you are unable to keep an appointment. What may interact with this medicine?  certain antibiotics given by injection  NSAIDs, medicines for pain and inflammation, like ibuprofen or naproxen  some diuretics like bumetanide, furosemide  teriparatide  thalidomide This list may not describe all possible interactions. Give your health care provider a list of all the medicines, herbs, non-prescription drugs, or dietary supplements you use. Also tell them if you smoke, drink alcohol, or use illegal drugs. Some items may interact with your medicine. What should I watch for while using this medicine? Visit your doctor or health care professional for regular checkups. It may be some time before you see the benefit from this medicine. Do not stop taking your medicine unless your doctor tells you to. Your doctor may order blood tests or other tests to see how you are doing. Women should inform their doctor if they wish to become pregnant or think they might be   There is a potential for serious side effects to an unborn child. Talk to your health care professional or pharmacist for more information. You should make sure that you get enough calcium and vitamin D while you are taking this medicine. Discuss the foods you eat and the vitamins you take  with your health care professional. Some people who take this medicine have severe bone, joint, and/or muscle pain. This medicine may also increase your risk for jaw problems or a broken thigh bone. Tell your doctor right away if you have severe pain in your jaw, bones, joints, or muscles. Tell your doctor if you have any pain that does not go away or that gets worse. Tell your dentist and dental surgeon that you are taking this medicine. You should not have major dental surgery while on this medicine. See your dentist to have a dental exam and fix any dental problems before starting this medicine. Take good care of your teeth while on this medicine. Make sure you see your dentist for regular follow-up appointments. What side effects may I notice from receiving this medicine? Side effects that you should report to your doctor or health care professional as soon as possible:  allergic reactions like skin rash, itching or hives, swelling of the face, lips, or tongue  anxiety, confusion, or depression  breathing problems  changes in vision  eye pain  feeling faint or lightheaded, falls  jaw pain, especially after dental work  mouth sores  muscle cramps, stiffness, or weakness  redness, blistering, peeling or loosening of the skin, including inside the mouth  trouble passing urine or change in the amount of urine Side effects that usually do not require medical attention (report to your doctor or health care professional if they continue or are bothersome):  bone, joint, or muscle pain  constipation  diarrhea  fever  hair loss  irritation at site where injected  loss of appetite  nausea, vomiting  stomach upset  trouble sleeping  trouble swallowing  weak or tired This list may not describe all possible side effects. Call your doctor for medical advice about side effects. You may report side effects to FDA at 1-800-FDA-1088. Where should I keep my medicine? This drug  is given in a hospital or clinic and will not be stored at home. NOTE: This sheet is a summary. It may not cover all possible information. If you have questions about this medicine, talk to your doctor, pharmacist, or health care provider.  2020 [employer] Standard (2013-11-27 14:19:39)  

## 2020-02-17 NOTE — Telephone Encounter (Signed)
Scheduled per 8/5 los. Messaged RN Geni Bers to give pt appt calendar.

## 2020-02-18 LAB — KAPPA/LAMBDA LIGHT CHAINS
Kappa free light chain: 102.4 mg/L — ABNORMAL HIGH (ref 3.3–19.4)
Kappa, lambda light chain ratio: 56.89 — ABNORMAL HIGH (ref 0.26–1.65)
Lambda free light chains: 1.8 mg/L — ABNORMAL LOW (ref 5.7–26.3)

## 2020-02-22 ENCOUNTER — Other Ambulatory Visit: Payer: Self-pay | Admitting: Hematology

## 2020-02-22 DIAGNOSIS — C9002 Multiple myeloma in relapse: Secondary | ICD-10-CM

## 2020-02-23 DIAGNOSIS — M23307 Other meniscus derangements, unspecified meniscus, left knee: Secondary | ICD-10-CM | POA: Diagnosis not present

## 2020-02-23 MED FILL — CYCLOPHOSPHAMIDE 50 MG CAPS: 50 | 28 days supply | Qty: 12 | Fill #0

## 2020-02-25 DIAGNOSIS — I739 Peripheral vascular disease, unspecified: Secondary | ICD-10-CM | POA: Diagnosis not present

## 2020-02-25 DIAGNOSIS — B351 Tinea unguium: Secondary | ICD-10-CM | POA: Diagnosis not present

## 2020-02-25 DIAGNOSIS — I872 Venous insufficiency (chronic) (peripheral): Secondary | ICD-10-CM | POA: Diagnosis not present

## 2020-02-25 DIAGNOSIS — M792 Neuralgia and neuritis, unspecified: Secondary | ICD-10-CM | POA: Diagnosis not present

## 2020-03-02 ENCOUNTER — Inpatient Hospital Stay: Payer: Medicare Other

## 2020-03-02 ENCOUNTER — Inpatient Hospital Stay: Payer: Medicare Other | Admitting: Nurse Practitioner

## 2020-03-02 ENCOUNTER — Other Ambulatory Visit: Payer: Self-pay

## 2020-03-02 VITALS — BP 115/68 | HR 67 | Temp 97.5°F | Resp 17 | Ht 66.5 in | Wt 114.4 lb

## 2020-03-02 DIAGNOSIS — Z923 Personal history of irradiation: Secondary | ICD-10-CM | POA: Diagnosis not present

## 2020-03-02 DIAGNOSIS — Z9481 Bone marrow transplant status: Secondary | ICD-10-CM | POA: Diagnosis not present

## 2020-03-02 DIAGNOSIS — C9002 Multiple myeloma in relapse: Secondary | ICD-10-CM

## 2020-03-02 DIAGNOSIS — C9000 Multiple myeloma not having achieved remission: Secondary | ICD-10-CM

## 2020-03-02 DIAGNOSIS — Z5112 Encounter for antineoplastic immunotherapy: Secondary | ICD-10-CM | POA: Diagnosis not present

## 2020-03-02 DIAGNOSIS — Z9221 Personal history of antineoplastic chemotherapy: Secondary | ICD-10-CM | POA: Diagnosis not present

## 2020-03-02 DIAGNOSIS — Z79899 Other long term (current) drug therapy: Secondary | ICD-10-CM | POA: Diagnosis not present

## 2020-03-02 DIAGNOSIS — Z7982 Long term (current) use of aspirin: Secondary | ICD-10-CM | POA: Diagnosis not present

## 2020-03-02 LAB — CMP (CANCER CENTER ONLY)
ALT: 28 U/L (ref 0–44)
AST: 29 U/L (ref 15–41)
Albumin: 3.5 g/dL (ref 3.5–5.0)
Alkaline Phosphatase: 110 U/L (ref 38–126)
Anion gap: 7 (ref 5–15)
BUN: 20 mg/dL (ref 8–23)
CO2: 22 mmol/L (ref 22–32)
Calcium: 9.5 mg/dL (ref 8.9–10.3)
Chloride: 108 mmol/L (ref 98–111)
Creatinine: 0.85 mg/dL (ref 0.44–1.00)
GFR, Est AFR Am: >60 mL/min (ref >60)
GFR, Estimated: >60 mL/min (ref >60)
Glucose, Bld: 98 mg/dL (ref 70–99)
Potassium: 4 mmol/L (ref 3.5–5.1)
Sodium: 137 mmol/L (ref 135–145)
Total Bilirubin: 1.2 mg/dL (ref 0.3–1.2)
Total Protein: 6.3 g/dL — ABNORMAL LOW (ref 6.5–8.1)

## 2020-03-02 LAB — CBC WITH DIFFERENTIAL (CANCER CENTER ONLY)
Abs Immature Granulocytes: 0.06 10*3/uL (ref 0.00–0.07)
Basophils Absolute: 0 10*3/uL (ref 0.0–0.1)
Basophils Relative: 1 %
Eosinophils Absolute: 0.1 10*3/uL (ref 0.0–0.5)
Eosinophils Relative: 2 %
HCT: 24.8 % — ABNORMAL LOW (ref 36.0–46.0)
Hemoglobin: 8 g/dL — ABNORMAL LOW (ref 12.0–15.0)
Immature Granulocytes: 2 %
Lymphocytes Relative: 8 %
Lymphs Abs: 0.3 10*3/uL — ABNORMAL LOW (ref 0.7–4.0)
MCH: 35.4 pg — ABNORMAL HIGH (ref 26.0–34.0)
MCHC: 32.3 g/dL (ref 30.0–36.0)
MCV: 109.7 fL — ABNORMAL HIGH (ref 80.0–100.0)
Monocytes Absolute: 0.4 10*3/uL (ref 0.1–1.0)
Monocytes Relative: 11 %
Neutro Abs: 3.1 10*3/uL (ref 1.7–7.7)
Neutrophils Relative %: 76 %
Platelet Count: 127 10*3/uL — ABNORMAL LOW (ref 150–400)
RBC: 2.26 MIL/uL — ABNORMAL LOW (ref 3.87–5.11)
RDW: 18.6 % — ABNORMAL HIGH (ref 11.5–15.5)
WBC Count: 4 10*3/uL (ref 4.0–10.5)
nRBC: 0 % (ref 0.0–0.2)

## 2020-03-02 MED ORDER — DEXAMETHASONE 4 MG PO TABS
20.0000 mg | ORAL_TABLET | Freq: Once | ORAL | Status: AC
  Administered 2020-03-02: 20 mg via ORAL

## 2020-03-02 MED ORDER — PALONOSETRON HCL INJECTION 0.25 MG/5ML
INTRAVENOUS | Status: AC
  Filled 2020-03-02: qty 5

## 2020-03-02 MED ORDER — DEXTROSE 5 % IV SOLN
69.0000 mg/m2 | Freq: Once | INTRAVENOUS | Status: AC
  Administered 2020-03-02: 110 mg via INTRAVENOUS
  Filled 2020-03-02: qty 15

## 2020-03-02 MED ORDER — PALONOSETRON HCL INJECTION 0.25 MG/5ML
0.2500 mg | Freq: Once | INTRAVENOUS | Status: AC
  Administered 2020-03-02: 0.25 mg via INTRAVENOUS

## 2020-03-02 MED ORDER — DEXAMETHASONE 4 MG PO TABS
ORAL_TABLET | ORAL | Status: AC
  Filled 2020-03-02: qty 5

## 2020-03-02 MED ORDER — SODIUM CHLORIDE 0.9 % IV SOLN
Freq: Once | INTRAVENOUS | Status: AC
  Filled 2020-03-02: qty 250

## 2020-03-02 NOTE — Progress Notes (Addendum)
[name]   [phone] [phone]   Clinic Follow up Note   Patient Care Team: [name], MD as PCP - General (Family Medicine) [name], MD as PCP - Cardiology (Cardiology) [name], MD as Attending Physician (Obstetrics and Gynecology) [name], MD as Consulting Physician (Internal Medicine) [name], MD as Consulting Physician (Hematology) [name], MD as Consulting Physician (Ophthalmology) [name], MD as Consulting Physician (Cardiology) [name], MD as Consulting Physician (Dermatology) [name] (Dentistry) [name], DPM as Consulting Physician (Podiatry) Lazaro Arms, RN as [place] Management Lazaro Arms, RN as Case Manager 03/03/2020  CHIEF COMPLAINT: F/u MM  SUMMARY OF ONCOLOGIC HISTORY: Oncology History Overview Note  Multiple myeloma [facility])   Staging form: Multiple Myeloma, AJCC 6th Edition   - Clinical: Stage IIIA - Signed by [name], MD on 09/04/2013    Multiple myeloma ([place])  05/26/2012 Initial Diagnosis   Presenting IgG was 4,040 mg/dL on 06/05/2012 (IgA 35; IgM 34); kappa free light chain 34.4 mg/dL, lambda 0.00, kappa:lambda ratio of 34.75; SPEP with M-spike of 2.60.   06/30/2012 Imaging   Numerous lytic lesions throughout the calvarium.  Lytic lesion within the left lateral scapula.  Findings most compatible with metastases or myeloma. Multiple mid and lower thoracic compression fractures.  Slight compression through the endplates at L3.   50/93/2671 Bone Marrow Biopsy   Bone marrow biopsy showed 49% plasma cell. Normal classical cytogenetics; however, myeloma FISH panel showed 13q- (intermediate risk)       07/14/2012 - 07/27/2012 Radiation Therapy   Palliative radiation 20 Gy over 10 fractions between to thoracic spine cord compression and symptomatic left scapula lytic lesion.   08/10/2012 - 11/2012 Chemotherapy    Started SQ Velcade once weekly, 3 weeks on, 1 weeks off; daily Revlimid d1-21, 7 days off; and Dexamethasone $RemoveBeforeDEI'40mg'mtlRwwbFFbfYoXHZ$  PO weekly.    02/12/2013 Bone Marrow Transplant   Auto bone marrow transplant at [facility]     06/14/2013 Tumor Marker   IgG  1830,  Kappa:lamba ratio, 1.69 (baseline was 34.75 or   M-spike 0.28 (baseline of 2.6 or  89.3% of baseline).     06/25/2013 -  Chemotherapy   Started zometa 3.5 mg monthly    09/01/2013 - 01/2014 Chemotherapy   Maintenance therapy with revlimid $RemoveBefor'5mg'zbViIfCrcGOl$  daily for 14 days and then off for 7 (decreased from 21 days on and 7 days off based on neutropenia). Stopped due to disease progression 01/2017   09/13/2013 - 09/17/2014 Hospital Admission   Hospital admission for pneumonia   12/20/2013 Treatment Plan Change   Maintenance therapy decreased to 2.5 mg daily for 21 days and then off for 7 days based on low counts.    01/25/2014 Tumor Marker   IgG 1360 M spike 0.5   03/01/2014 - 03/05/2014 Hospital Admission   [facility] center with an inferior STEMI, received thrombolytic therapy, cath showed 60% prox RCA, mid-distal RCA 50%, Echo normal, no stents.   04/11/2014 - 08/07/2014 Chemotherapy   Continue Revlimid at 2.5 mg 3 weeks on 1 week off   08/08/2014 - 08/13/2014 Hospital Admission   Hospital admission for pneumonia. Her Revlimid was held.   08/29/2014 - 09/14/2014 Chemotherapy   Maintenance Revlimid restarted   09/14/2014 - 09/17/2014 Hospital Admission   Admitted for pneumonia after coming back from a trip in [name]. Revlimid was held again.   11/13/2014 - 12/25/2015 Chemotherapy   Maintenance Revlimid restarted, changed to  2.5mg  daily, 2 weks on, 1 week off from 12/15/2014, stopped due to disease progression    01/24/2016 Progression   Patient is M protein has gradually increased to 1.0g, repeated a bone marrow biopsy showed plasma cell 10-30%   02/27/2016 - 03/07/2016 Chemotherapy   Pomalidomide 4 mg daily on day 1-21, dexamethasone 40 mg on day  1, 8 and 15, every 28 days, started on 02/27/2016, daratumumab weekly started on 03/07/2016, held after first dose, due to hospitalization and a severe pancytopenia, pomalidomide was stopped afterwards    03/10/2016 - 03/29/2016 Hospital Admission   Pt was admitted for sepsis from pneumonia, and severe pancytopenia. She required ICU stay for a few days due to hypotension, developed b/l pleural effusion required diuretics and b/l thoracentesis. She also required blood and plt transfusion, and prolonged neupogen injection for severe neutropenia. She was discharged home after 3 weeks hospital stay    04/26/2016 - 04/26/2019 Chemotherapy   Daratumumab restarted on 04/26/2016, Velcade 1.3mg /m2 and dexa 20mg  weekly start on 11/3, velcade held since 06/28/2016 due to CHF, dexa stopped on12/11/2017 due to high risk of fracture.  Dara reduced to every 4 weeks due to COVID-19 starting 10/23/18. Returned to every 3 weeks starting 12/17/18. Restarted every 2 weeks starting 02/22/19 Stopped on 04/26/19 due to disease progression.    08/26/2016 Echocardiogram   - Left ventricle: LVEF is approximately 35% with diffuse diffuse   hypokinesis with severe hypokinesis/ akinesis of the   inferior/inferoseptal walls. In comparison to echo images from   December 2017, there does not appear to be significant change The   cavity size was normal. Wall thickness was normal. Doppler   parameters are consistent with abnormal left ventricular   relaxation (grade 1 diastolic dysfunction). - Aortic valve: AV is thickened. 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 Present in echo from December 2017 There was   trivial regurgitation. - Right ventricle: Systolic function was mildly reduced. - Right atrium: The atrium was mildly dilated. - Pericardium, extracardiac: A trivial pericardial effusion was   identified.   12/03/2016 Echocardiogram   -Left Ventricle: Systolic  function was   mildly to moderately reduced. The estimated ejection fraction was   in the range of 40% to 45%. Diffuse hypokinesis. Doppler   parameters are consistent with abnormal left ventricular   relaxation (grade 1 diastolic dysfunction). Doppler parameters   are consistent with indeterminate ventricular filling pressure. - Left atrium: The atrium was severely dilated. - Tricuspid valve: There was trivial regurgitation. - Pulmonary arteries: Systolic pressure was within the normal   range. PA peak pressure: 33 mm Hg (S). - Global longitudinal strain -10.5% (abnormal)   03/12/2017 Echocardiogram   ECHO 03/12/17 Study Conclusions - Left ventricle: The cavity size was normal. Wall thickness was   increased in a pattern of mild LVH. Systolic function was normal.   The estimated ejection fraction was in the range of 50% to 55%.   There is hypokinesis of the basalinferior myocardium. Doppler   parameters are consistent with abnormal left ventricular   relaxation (grade 1 diastolic dysfunction). - Aortic valve: There was trivial regurgitation. - Mitral valve: Calcified annulus. Mildly thickened leaflets .   There was trivial regurgitation. - Tricuspid valve: There was mild regurgitation. Impressions: - There has been mild improvement in EF since prior study.    12/12/2017 - 12/15/2017 Hospital Admission   Admission diagnosis: Pnuemonia and acute hypoxic respiratory failure Additional comments: treated  for pneumoania and acute hypoxic respiratory failure before she was discharged on 12/15/17 with oral anitbiotics. She did not require home oxygen.    12/30/2017 - 01/02/2018 Hospital Admission   Admission diagnosis: Pnuemonia  Additional comments: She was again hospitalized on 12/30/17 for pneumonia, ID work up was otherwise negative, she was discharged home with oral antibiotics.    04/01/2018 Imaging   04/01/2018 CXR IMPRESSION: Persisting airspace disease superimposed on chronic  changes, suggesting pneumonia given the history. Followup PA and lateral chest X-ray is recommended in 3-4 weeks following therapy to ensure resolution and exclude underlying malignancy.  Redemonstration of mid and lower thoracic compression fractures with associated thoracic kyphosis.   04/23/2018 Echocardiogram   04/23/2018 ECHO LV EF: 45% -   50%   01/07/2019 Imaging   MRI Brain 01/07/19 IMPRESSION: MRI HEAD IMPRESSION:   1. 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 Lesion closely approximates the left inner ear structures and involves the left Meckel's cave and left fifth cranial nerve, and most likely is the causative etiology for patient's underlying left ear and facial symptoms. Follow-up examination with postcontrast imaging suggested for complete evaluation. Inclusion of IAC sequences would likely be helpful for visualization. 2. Additional 2.7 cm lesion involving the right occipital calvarium with adjacent marrow signal abnormality, also likely related to multiple myeloma. Metastatic disease would be the primary differential consideration. 3. Underlying age-appropriate cerebral atrophy with mild chronic microvascular ischemic disease. No other acute intracranial abnormality.   MRA HEAD IMPRESSION:   1. Negative intracranial MRA with no large vessel occlusion, hemodynamically significant stenosis, or other acute vascular abnormality. 2. Petrous and cavernous left ICA is partially encased and surrounded by the left petrous apex lesion without significant irregularity or stenosis. 3. 2 mm right paraophthalmic aneurysm.   MRA NECK IMPRESSION:   Normal MRA of the neck with wide patency of the carotid and vertebral arteries bilaterally. No hemodynamically significant stenosis or other acute vascular abnormality identified.     01/14/2019 - 01/27/2019 Radiation Therapy   By Dr. Tammi Klippel.    Diagnosis:   79 yo woman with skull base myeloma lesion causing left ear pain      Indication for treatment:  Palliative        Radiation treatment dates:   01/14/2019 - 01/27/2019   Site/dose:   Skull Base / 20 Gy in 10 fractions   Beams/energy:   3D, photons / 10X, 6X   04/01/2019 Pathology Results   Bone Marrow biopsy 04/01/19  DIAGNOSIS: BONE MARROW, ASPIRATE, CLOT, CORE: - Normocellular marrow with minimal involvement by plasma cell neoplasm - See comment PERIPHERAL BLOOD: - Macrocytic anemia and leukopenia -See complete blood count   04/09/2019 PET scan   PET 04/09/19  IMPRESSION: 1. Scattered lytic lesions of bone. A few of these demonstrate hypermetabolic activity, most notably the right occipital bone lesion; the lytic lesion in the approximately C7 spinous process; the lytic expansile lesion of the left fourth rib anteriorly; and a small lytic focus in the right femoral head. Other scattered lytic lesions have only low-grade activity. 2. Multiple fractures as detailed above. 3. Other imaging findings of potential clinical significance: Aortic Atherosclerosis (ICD10-I70.0). Coronary atherosclerosis.   05/10/2019 -  Chemotherapy   -CyBorD (cyclophosphamide (CYTOXAN) 546m (3074mm2), Velcade 1.56m2m2, and dexa 40m67meekly) starting 05/24/19.   -Due to neuropathy after 2 doses of Velcade, I switched to weekly Kyprolis 3  weeks on/1 week off on 06/14/19.   -Chemo held for RT 07/05/19-07/22/19.   -Dexa stopped and Cytoxan dose reduced to 400 mg on day 1, 8 and 15 every 28 days, from cycle 3.     07/05/2019 - 07/20/2019 Radiation Therapy   By Dr. Tammi Klippel   Diagnosis:   79 yo woman with painful right occipital and left hip involvement by multiple myeloma      Indication for treatment:  Palliation        Radiation treatment dates:   07/05/19-07/20/19   Site/dose:    1.  The Right Occipital skull lesion was treated  to 20 Gy in 20 fractions of 2 Gy 2.  The Left Hip lesion was treated to 20 Gy in 20 fractions of 2 Gy   Beams/energy:      1.  The Right Occipital skull lesion was treated using a 3-field 3D set-up 2.  The Left Hip lesion was treated anterior and posterior fields.     CURRENT THERAPY:  -CyBorD (cyclophosphamide(CYTOXAN)500mg  (300mg /m2),Velcade 1.3mg /m2, and dexa 20mg , weekly) starting 05/24/19. -Due toneuropathyafter 2 doses of Velcade, I switched toweeklyKyprolis 3 weeks on/1 week off on 06/14/19.Reduced to 2 weeks on/1 week off starting 09/16/19 due to thrombocytopenia.  -Chemo heldfor RT 07/05/19-07/22/19. -Dexareduced to 20mg andCytoxan dose reduced to 400 mg on day 1, 8 and 15 every 28 days, from cycle 3on 08/19/19, Cytoxan reduced to 300mg on day 1, 8 every 21 daysafter 09/02/19. -Reduce regimen to every 2 weeks starting with C8 on 12/09/19. Increased to 2 weeks on/1 week off starting 03/02/20 -Restart Zometa injections every6 weekson 06/21/19.  INTERVAL HISTORY: Ms. Gariepy returns for f/u as scheduled. She completed another cycle on kyprolis/cytoxan/dex on 02/17/20. Chemo was increased to 2 weeks on/1 week off starting today due to increased M spike.   Today, she is still very fatigued without much recovery from chemo.  She is able to be out of bed doing ADLs but has not felt like being very active or exercising this week.  Eating some but not much, manages mild nausea and constipation.  She feels more tipsy in the last few days.  She notices her speech is slower, "cannot make the sounds I want to," and voice is different.  Denies headache.  No fall.  She has "bandlike pain" from the mid back around to her upper abdomen/lower ribs.  She thinks that [facility] may have [facility]  She has had this before.  Pain management still effective with 1/4 tab morphine as needed, pain improving overall.  Denies cough,  shortness of breath, fever, chills.  She continues to have right greater than left lower extremity edema and pains up the right leg.   MEDICAL HISTORY:  Past Medical History:  Diagnosis Date  . CAD (coronary artery disease)    a. inf STEMI (in [place] >>> [facility]) >>> LHC (8/15- [place]):  pRCA 60%, mid to dist RCA 50% >>> med rx  . Cardiomyopathy ([place])   . Cataract   . Chronic combined systolic and diastolic CHF (congestive heart failure) ([employer])    cancer medication related   . Cord compression ([place]) 07/13/12   MRI- diffuse myeloma involvement of T-L spine  . History of radiation therapy 07/13/12-07/27/12   spinal cord compression T3-T10,left scapula  . 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 Multiple myeloma ([place]) 07/01/2012  . MVP (mitral valve prolapse)    a. trivial by echo 06/2017.  Marland Kitchen  OSTEOPOROSIS 06/11/2010   Multiple compression fractures; and spontaneous fracture of sternum Qualifier: Diagnosis of  By: [name], [name]     . Thoracic kyphosis 07/13/12   per MRI scan  . Unspecified deficiency anemia     SURGICAL HISTORY: Past Surgical History:  Procedure Laterality Date  . APPENDECTOMY    . CATARACT EXTRACTION, BILATERAL    . CESAREAN SECTION     x2   . COLONOSCOPY  2007   neg with Dr. [name]  . ELBOW SURGERY    . HIP SURGERY  2009   left  . IR REMOVAL TUN CV CATH W/O FL  02/15/2020  . TUBAL LIGATION      I have reviewed the social history and family history with the patient and they are unchanged from previous note.  ALLERGIES:  is allergic to zithromax [azithromycin], zosyn [piperacillin sod-tazobactam so], and quinolones.  MEDICATIONS:  Current Outpatient Medications  Medication Sig Dispense Refill  . acetaminophen (TYLENOL) 500 MG tablet Take 500 mg by mouth every 6 (six) hours as needed for moderate pain or fever.    Marland Kitchen acyclovir (ZOVIRAX) 400 MG tablet Take 1 tablet (400 mg total) by mouth 2 (two) times daily. 180 tablet 3  . aspirin EC 81  MG tablet Take 1 tablet (81 mg total) by mouth daily. 90 tablet 3  . atorvastatin (LIPITOR) 20 MG tablet TAKE 1 TABLET BY MOUTH  DAILY 90 tablet 3  . calcium-vitamin D (OSCAL WITH D) 500-200 MG-UNIT TABS tablet Take by mouth.    . cyclophosphamide (CYTOXAN) 50 MG capsule Take 6 capsules ($RemoveBef'300mg'YUeIIKGCkn$ ) by mouth once weekly on day of chemo for 2 weeks, then off for one week 24 capsule 1  . metoprolol succinate (TOPROL-XL) 25 MG 24 hr tablet TAKE 1 TABLET BY MOUTH  DAILY 90 tablet 3  . morphine (MSIR) 15 MG tablet Take 0.5 tablets (7.5 mg total) by mouth every 8 (eight) hours as needed for severe pain. 20 tablet 0  . ondansetron (ZOFRAN) 8 MG tablet Take 1 tablet (8 mg total) by mouth every 8 (eight) hours as needed for nausea or vomiting. 20 tablet 3  . potassium chloride SA (KLOR-CON) 20 MEQ tablet Take 1 tablet (20 mEq total) by mouth 2 (two) times daily. 10 tablet 0  . prochlorperazine (COMPAZINE) 10 MG tablet Take 1 tablet (10 mg total) by mouth every 6 (six) hours as needed (Nausea or vomiting). 30 tablet 1  . triamcinolone cream (KENALOG) 0.1 % Apply 0.1 application topically 2 (two) times daily as needed.    . doxycycline (VIBRA-TABS) 100 MG tablet Take 100 mg by mouth as needed (treatment for pneumonia).     . furosemide (LASIX) 20 MG tablet Take 1 tablet (20 mg total) by mouth daily. For 5 days then as needed 10 tablet 0   No current facility-administered medications for this visit.   Facility-Administered Medications Ordered in Other Visits  Medication Dose Route Frequency Provider Last Rate Last Admin  . 0.9 %  sodium chloride infusion   Intravenous Once [name], MD        PHYSICAL EXAMINATION: ECOG PERFORMANCE STATUS: 2 - Symptomatic, <50% confined to bed  Vitals:   03/02/20 1139  BP: 115/68  Pulse: 67  Resp: 17  Temp: (!) 97.5 F (36.4 C)  SpO2: 96%   Filed Weights   03/02/20 1139  Weight: 114 lb 6.4 oz (51.9 kg)    GENERAL:alert, no distress and comfortable SKIN: No rash  to exposed skin  EYES:  sclera clear HEENT: Symmetric without facial droop, tongue is midline LUNGS: clear with normal breathing effort HEART: regular rate & rhythm Musculoskeletal: right greater than left lower extremity edema with erythema to the lower legs  NEURO: alert & oriented x 3.  Slower speech without slurring. Normal strength. Neuro exam non-focal   LABORATORY DATA:  I have reviewed the data as listed CBC Latest Ref Rng & Units 03/02/2020 02/17/2020 02/04/2020  WBC 4.0 - 10.5 K/uL 4.0 3.0(L) 3.2(L)  Hemoglobin 12.0 - 15.0 g/dL 8.0(L) 9.2(L) 9.8(L)  Hematocrit 36 - 46 % 24.8(L) 28.0(L) 29.8(L)  Platelets 150 - 400 K/uL 127(L) 140(L) 120(L)     CMP Latest Ref Rng & Units 03/02/2020 02/17/2020 02/04/2020  Glucose 70 - 99 mg/dL 98 98 87  BUN 8 - 23 mg/dL $Remove'20 20 14  'reNydfp$ Creatinine 0.44 - 1.00 mg/dL 0.85 0.77 0.64  Sodium 135 - 145 mmol/L 137 137 141  Potassium 3.5 - 5.1 mmol/L 4.0 3.7 3.8  Chloride 98 - 111 mmol/L 108 105 108  CO2 22 - 32 mmol/L $RemoveB'22 22 23  'ufkXrfTn$ Calcium 8.9 - 10.3 mg/dL 9.5 9.5 9.1  Total Protein 6.5 - 8.1 g/dL 6.3(L) 6.3(L) -  Total Bilirubin 0.3 - 1.2 mg/dL 1.2 1.0 -  Alkaline Phos 38 - 126 U/L 110 101 -  AST 15 - 41 U/L 29 20 -  ALT 0 - 44 U/L 28 14 -      RADIOGRAPHIC STUDIES: I have personally reviewed the radiological images as listed and agreed with the findings in the report. No results found.   ASSESSMENT & PLAN: Shannon Rush a [id number].o.femalewith    1. IgG kappa Multiple myeloma s/p Auto SCT, intermediate risk, relapsed in 01/2016 -Diagnosed in 05/2012. Treated with induction chemo, radiation and BM transplant, but unfortunately progressed while on maintenance Revlimid 01/2016. She is currently onDaratumumabmonotherapy -Dexa was stoppeddue to her high risk of fracture, and well controlled MM -During COVID19 treatment changed toevery 4 weeks, and changed back to every 3 weeks in early June -Due toher slight disease progressionandsymptomatic  bone lesion in left petrous apex, Dr. Burr Medico changed Dara back to q2 weeks. -Due to increase in M protein from 0.3 to 0.6, she underwent restaging bone marrow biopsy in 03/2019 which shows normocellular marrow and minimal involvement by plasma cell neoplasm (2%) -She also underwent PET scan in 03/2019 which shows scattered lytic lesions, some of which show hypermetabolic activity. -M spike up to 1.1 on 06/07/19 and she developed worsening cytopenias, consistent with progression -She started CyBorD on 05/24/19. She developed worsening neuropathy after first velcade injand it was changed to kyprolis after second dose. -Her M spike decreased and sustained at 0.1, her chemo has been de-escalated to q2 weeks, s/p cycle 11 on 01/20/20 then treatment held for lower extremity skin infection. She completed antibiotics -Her 02/04/20 M protein increased to 0.3, the recommendation was to escalate chemo back to 2 weeks on/1 week off q21 days.  -s/p cycle 12 on 02/17/20   2. LE cellulitis  -hospitalized at [facility]/27/20 for left lower extremity cellulitis s/p PICC line and antibiotics. Then she developed right leg edema -Doppler negative for DVT 7/30 -no signs of CHF on exam -stable edema and erythema in the lower legs   3. Left petrous apex bone lesionand right occipital lesion -Her inner ear issues and dizziness progressed and she developedmildbalance issueand nausea  -Her MRI brain from 01/07/19 showedabnormal osseous lesion measuring up to approximately 5.9 cm centered at the  left petrous apex. Thismost likely is the cause of her underlyingleft ear and balancesymptoms. -S/ptarget RT with Dr Tammi Klippel 7/1-7/15. -She has stable muffled hearing and mild imbalanceissue, no significant improvement after RT -S/p RT toright occipital lesion per Dr. Tammi Klippel starting 12/21- 07/20/19 -Her ear fullness and altered balance has much improved, but "tipsiness" not resolved and slightly worsened after last cycle  chemo. No fall; continue balance exercises  4. History of multiple pneumonia andbronchitis -She has had multiple episodes of pneumoniaand bronchitis, required hospitalizationandantibiotics -Her vaccines are up-to-date. -no clinical concern for recurrent infection  5. Pancytopenia -Secondary toMM and treatment  -neutropenia resolved, anemia and thrombocytopenia worsened lately, possibly due to recent infection vs mild disease progression vs treatment   6. CHF, CAD, inferior STEMI in 02/2014, HLD -She is on ASA,beta blocker, statin -f/u with Dr. Meda Coffee and continue medications -normal EF and improved strain on echo from 04/27/19, per cardiology PA ok to do echo q6 months for now -monitoring cardiac enzymes periodically on Kyprolis  7. Osteoporosis with multiple fractures -Last DEXA in 2015 showed worsening osteoporosis. She declined repeat DEXA. -She had a fall induced fracture in 01/2018. -She was on Zometa for 4 years, stopped after April 2018 -she developed left hip pain,Xray on 11/30 negative for new fracture, hardware is intact. This is likely related to MM -RestartedZometa on 06/21/19,dose-adjustedfor CrCl, continue q6 weeks (last dose 12/24/19) -S/ppalliative RTto left hip and right occipital skullper Dr. Tammi Klippel From12/21/20- 07/20/19, left hip painimproved -Her knee, hip, neck pains are stable at baseline, well controlled on low dose morphine PRN, takes some days and most nights.  -new pain from her back across ribs and right chest wall, she believes she cracked a rib exercising. Declined xrays   8. Peripheral neuropathy in feet, G1-2 -Secondary to previous chemo,tuning fork exam shows decreased vibratory sense up to her ankle.  -previouslydeclined gabapentin -Worsened on Velcade which was switched to Kyprolis after 2 doses -Stable on Kyprolis, able to function well  9. Goal of care discussion  -full code -treatment goal is  palliative  Disposition:  Ms. Lemar appears deconditioned but stable. She completed another cycle of Kyprolis/cytoxan/dex. She tolerated well except increased fatigue. Her slower recovery is likely related to recent infection and anemia, Hg 8.0 today. Labs otherwise stable.   Her husband was included in today's visit and the patient was seen with Dr. Burr Medico. Overall Ms. Scogin appears at her baseline.   We discussed pain management and she knows to avoid heavy lifting. She declined rib xray today, will likely not change the plan.   Due to increased M spike the plan was to escalate treatment to 2 weeks on/1 week off q21 days. Given her delayed recovery, we will continue q2 weeks for now. If M protein rises further we will consider increasing chemo. We will follow up on the MM panel from today. She was encouraged to f/u with Dr. [name] at [facility].   She will proceed with another cycle of Kyprolis/cytoxan/dex today. We will keep her lab/infusion appointment as schedule don 8/26 in the event she needs blood transfusion after port placement next week. She is planning to go out of town next week and for [place]. She will return for f/u and treatment in 3 weeks. She knows to call if she has worsening fatigue or other symptoms and we can see her sooner with labs and possible RBC transfusion.    Orders Placed This Encounter  Procedures  . Sample to Blood Bank  Standing Status:   Future    Standing Expiration Date:   03/03/2021   All questions were answered. The patient knows to call the clinic with any problems, questions or concerns. No barriers to learning was detected.     Alla Feeling, NP 03/03/20   Addendum  I have seen the patient, examined her. I agree with the assessment and and plan and have edited the notes.   Marvelous returns for chemo. She has been more fatigued lately, I do not think she can tolerate 2 weeks on and 1 week off. Will proceed treatment today, and give her a break.  She has travel plan again, will return in 3 weeks for next cycle. If her M-protein continue to increase, we may need to change her treatment to next line therapy such as Blenrep. I encouraged her to f/u with Dr. [name] at [facility] also.   [name]  03/02/2020

## 2020-03-02 NOTE — Patient Instructions (Signed)
[facility] Discharge Instructions for Patients Receiving Chemotherapy  Today you received the following chemotherapy agents:  Kyprolis  To help prevent nausea and vomiting after your treatment, we encourage you to take your nausea medication as directed.   If you develop nausea and vomiting that is not controlled by your nausea medication, call the clinic.   BELOW ARE SYMPTOMS THAT SHOULD BE REPORTED IMMEDIATELY:  *FEVER GREATER THAN 100.5 F  *CHILLS WITH OR WITHOUT FEVER  NAUSEA AND VOMITING THAT IS NOT CONTROLLED WITH YOUR NAUSEA MEDICATION  *UNUSUAL SHORTNESS OF BREATH  *UNUSUAL BRUISING OR BLEEDING  TENDERNESS IN MOUTH AND THROAT WITH OR WITHOUT PRESENCE OF ULCERS  *URINARY PROBLEMS  *BOWEL PROBLEMS  UNUSUAL RASH Items with * indicate a potential emergency and should be followed up as soon as possible.  Feel free to call the clinic should you have any questions or concerns. The clinic phone number is [phone].  Please show the CHEMO ALERT CARD at check-in to the Emergency Department and triage nurse.   

## 2020-03-03 ENCOUNTER — Telehealth: Payer: Self-pay | Admitting: Hematology

## 2020-03-03 ENCOUNTER — Encounter: Payer: Self-pay | Admitting: Nurse Practitioner

## 2020-03-03 NOTE — Telephone Encounter (Signed)
Scheduled per 8/19 los. Pt is aware of appt times and dates on 8/26, 9/9, 9/23.

## 2020-03-03 NOTE — Progress Notes (Signed)
Pharmacist Chemotherapy Monitoring - Follow Up Assessment    I verify that I have reviewed each item in the below checklist:  . Regimen for the patient is scheduled for the appropriate day and plan matches scheduled date. Marland Kitchen Appropriate non-routine labs are ordered dependent on drug ordered. . If applicable, additional medications reviewed and ordered per protocol based on lifetime cumulative doses and/or treatment regimen.   Plan for follow-up and/or issues identified: Yes . I-vent associated with next due treatment: No . MD and/or nursing notified: Yes  Philomena Course 03/03/2020 10:43 AM

## 2020-03-06 ENCOUNTER — Other Ambulatory Visit: Payer: Self-pay | Admitting: Radiology

## 2020-03-06 LAB — MULTIPLE MYELOMA PANEL, SERUM
Albumin SerPl Elph-Mcnc: 3.7 g/dL (ref 2.9–4.4)
Albumin/Glob SerPl: 1.7 (ref 0.7–1.7)
Alpha 1: 0.3 g/dL (ref 0.0–0.4)
Alpha2 Glob SerPl Elph-Mcnc: 0.5 g/dL (ref 0.4–1.0)
B-Globulin SerPl Elph-Mcnc: 0.8 g/dL (ref 0.7–1.3)
Gamma Glob SerPl Elph-Mcnc: 0.7 g/dL (ref 0.4–1.8)
Globulin, Total: 2.3 g/dL (ref 2.2–3.9)
IgA: <5 mg/dL — ABNORMAL LOW (ref 64–422)
IgG (Immunoglobin G), Serum: 872 mg/dL (ref 586–1602)
IgM (Immunoglobulin M), Srm: <5 mg/dL — ABNORMAL LOW (ref 26–217)
M Protein SerPl Elph-Mcnc: 0.6 g/dL — ABNORMAL HIGH
Total Protein ELP: 6 g/dL (ref 6.0–8.5)

## 2020-03-07 ENCOUNTER — Encounter (HOSPITAL_COMMUNITY): Payer: Self-pay

## 2020-03-07 ENCOUNTER — Ambulatory Visit (HOSPITAL_COMMUNITY)
Admission: RE | Admit: 2020-03-07 | Discharge: 2020-03-07 | Disposition: A | Discharge status: Home / Self Care | Payer: Medicare Other | Source: Ambulatory Visit | Attending: Hematology | Admitting: Hematology

## 2020-03-07 ENCOUNTER — Other Ambulatory Visit: Payer: Self-pay

## 2020-03-07 DIAGNOSIS — Z79899 Other long term (current) drug therapy: Secondary | ICD-10-CM | POA: Diagnosis not present

## 2020-03-07 DIAGNOSIS — I5042 Chronic combined systolic (congestive) and diastolic (congestive) heart failure: Secondary | ICD-10-CM | POA: Insufficient documentation

## 2020-03-07 DIAGNOSIS — Z888 Allergy status to other drugs, medicaments and biological substances status: Secondary | ICD-10-CM | POA: Insufficient documentation

## 2020-03-07 DIAGNOSIS — M810 Age-related osteoporosis without current pathological fracture: Secondary | ICD-10-CM | POA: Diagnosis not present

## 2020-03-07 DIAGNOSIS — I2510 Atherosclerotic heart disease of native coronary artery without angina pectoris: Secondary | ICD-10-CM | POA: Diagnosis not present

## 2020-03-07 DIAGNOSIS — C9002 Multiple myeloma in relapse: Secondary | ICD-10-CM

## 2020-03-07 DIAGNOSIS — I429 Cardiomyopathy, unspecified: Secondary | ICD-10-CM | POA: Insufficient documentation

## 2020-03-07 DIAGNOSIS — Z452 Encounter for adjustment and management of vascular access device: Secondary | ICD-10-CM | POA: Diagnosis not present

## 2020-03-07 DIAGNOSIS — Z7982 Long term (current) use of aspirin: Secondary | ICD-10-CM | POA: Diagnosis not present

## 2020-03-07 DIAGNOSIS — Z881 Allergy status to other antibiotic agents status: Secondary | ICD-10-CM | POA: Insufficient documentation

## 2020-03-07 HISTORY — PX: IR IMAGING GUIDED PORT INSERTION: IMG5740

## 2020-03-07 MED ORDER — FENTANYL CITRATE (PF) 100 MCG/2ML IJ SOLN
INTRAMUSCULAR | Status: AC | PRN
  Administered 2020-03-07: 50 ug via INTRAVENOUS

## 2020-03-07 MED ORDER — SODIUM CHLORIDE 0.9 % IV SOLN: INTRAVENOUS | Status: DC

## 2020-03-07 MED ORDER — MIDAZOLAM HCL 2 MG/2ML IJ SOLN
INTRAMUSCULAR | Status: AC | PRN
  Administered 2020-03-07: 1 mg via INTRAVENOUS

## 2020-03-07 MED ORDER — VANCOMYCIN HCL IN DEXTROSE 1-5 GM/200ML-% IV SOLN
INTRAVENOUS | Status: AC
  Administered 2020-03-07: 1000 mg via INTRAVENOUS
  Filled 2020-03-07: qty 200

## 2020-03-07 MED ORDER — HEPARIN SOD (PORK) LOCK FLUSH 100 UNIT/ML IV SOLN
INTRAVENOUS | Status: AC
  Filled 2020-03-07: qty 5

## 2020-03-07 MED ORDER — HEPARIN SOD (PORK) LOCK FLUSH 100 UNIT/ML IV SOLN
INTRAVENOUS | Status: AC | PRN
  Administered 2020-03-07: 500 [IU] via INTRAVENOUS

## 2020-03-07 MED ORDER — MIDAZOLAM HCL 2 MG/2ML IJ SOLN
INTRAMUSCULAR | Status: AC
  Filled 2020-03-07: qty 4

## 2020-03-07 MED ORDER — VANCOMYCIN HCL IN DEXTROSE 1-5 GM/200ML-% IV SOLN: 1000.0000 mg | Freq: Once | INTRAVENOUS | Status: AC

## 2020-03-07 MED ORDER — FENTANYL CITRATE (PF) 100 MCG/2ML IJ SOLN
INTRAMUSCULAR | Status: AC
  Filled 2020-03-07: qty 2

## 2020-03-07 MED ORDER — LIDOCAINE-EPINEPHRINE 1 %-1:100000 IJ SOLN
INTRAMUSCULAR | Status: AC
  Filled 2020-03-07: qty 1

## 2020-03-07 MED ORDER — LIDOCAINE-EPINEPHRINE 1 %-1:100000 IJ SOLN
INTRAMUSCULAR | Status: AC | PRN
  Administered 2020-03-07 (×2): 10 mL via INTRADERMAL

## 2020-03-07 NOTE — Discharge Instructions (Addendum)
For questions /concerns may call Interventional Radiology at [phone]  You may remove your dressing and shower tomorrow afternoon  DO NOT use EMLA cream for 2 weeks after port placement as the cream will remove surgical glue on your incision.     Implanted Port Insertion, Care After This sheet gives you information about how to care for yourself after your procedure. Your health care provider may also give you more specific instructions. If you have problems or questions, contact your health care provider. What can I expect after the procedure? After the procedure, it is common to have:  Discomfort at the port insertion site.  Bruising on the skin over the port. This should improve over 3-4 days. Follow these instructions at home: Port care  After your port is placed, you will get a manufacturer's information card. The card has information about your port. Keep this card with you at all times.  Take care of the port as told by your health care provider. Ask your health care provider if you or a family member can get training for taking care of the port at home. A home health care nurse may also take care of the port.  Make sure to remember what type of port you have. Incision care      Follow instructions from your health care provider about how to take care of your port insertion site. Make sure you: ? Wash your hands with soap and water before and after you change your bandage (dressing). If soap and water are not available, use hand sanitizer. ? Change your dressing as told by your health care provider. ? Leave stitches (sutures), skin glue, or adhesive strips in place. These skin closures may need to stay in place for 2 weeks or longer. If adhesive strip edges start to loosen and curl up, you may trim the loose edges. Do not remove adhesive strips completely unless your health care provider tells you to do that.  Check your port insertion site every day for signs of  infection. Check for: ? Redness, swelling, or pain. ? Fluid or blood. ? Warmth. ? Pus or a bad smell. Activity  Return to your normal activities as told by your health care provider. Ask your health care provider what activities are safe for you.  Do not lift anything that is heavier than 10 lb (4.5 kg), or the limit that you are told, until your health care provider says that it is safe. General instructions  Take over-the-counter and prescription medicines only as told by your health care provider.  Do not take baths, swim, or use a hot tub until your health care provider approves. Ask your health care provider if you may take showers. You may only be allowed to take sponge baths.  Do not drive for 24 hours if you were given a sedative during your procedure.  Wear a medical alert bracelet in case of an emergency. This will tell any health care providers that you have a port.  Keep all follow-up visits as told by your health care provider. This is important. Contact a health care provider if:  You cannot flush your port with saline as directed, or you cannot draw blood from the port.  You have a fever or chills.  You have redness, swelling, or pain around your port insertion site.  You have fluid or blood coming from your port insertion site.  Your port insertion site feels warm to the touch.  You have pus or a   bad smell coming from the port insertion site. Get help right away if:  You have chest pain or shortness of breath.  You have bleeding from your port that you cannot control. Summary  Take care of the port as told by your health care provider. Keep the manufacturer's information card with you at all times.  Change your dressing as told by your health care provider.  Contact a health care provider if you have a fever or chills or if you have redness, swelling, or pain around your port insertion site.  Keep all follow-up visits as told by your health care  provider. This information is not intended to replace advice given to you by your health care provider. Make sure you discuss any questions you have with your health care provider. Document Revised: 01/27/2018 Document Reviewed: 01/27/2018 Elsevier Patient Education  2020 Elsevier Inc.   Implanted Port Home Guide An implanted port is a device that is placed under the skin. It is usually placed in the chest. The device can be used to give IV medicine, to take blood, or for dialysis. You may have an implanted port if:  You need IV medicine that would be irritating to the small veins in your hands or arms.  You need IV medicines, such as antibiotics, for a long period of time.  You need IV nutrition for a long period of time.  You need dialysis. Having a port means that your health care provider will not need to use the veins in your arms for these procedures. You may have fewer limitations when using a port than you would if you used other types of long-term IVs, and you will likely be able to return to normal activities after your incision heals. An implanted port has two main parts:  Reservoir. The reservoir is the part where a needle is inserted to give medicines or draw blood. The reservoir is round. After it is placed, it appears as a small, raised area under your skin.  Catheter. The catheter is a thin, flexible tube that connects the reservoir to a vein. Medicine that is inserted into the reservoir goes into the catheter and then into the vein. How is my port accessed? To access your port:  A numbing cream may be placed on the skin over the port site.  Your health care provider will put on a mask and sterile gloves.  The skin over your port will be cleaned carefully with a germ-killing soap and allowed to dry.  Your health care provider will gently pinch the port and insert a needle into it.  Your health care provider will check for a blood return to make sure the port is in  the vein and is not clogged.  If your port needs to remain accessed to get medicine continuously (constant infusion), your health care provider will place a clear bandage (dressing) over the needle site. The dressing and needle will need to be changed every week, or as told by your health care provider. What is flushing? Flushing helps keep the port from getting clogged. Follow instructions from your health care provider about how and when to flush the port. Ports are usually flushed with saline solution or a medicine called heparin. The need for flushing will depend on how the port is used:  If the port is only used from time to time to give medicines or draw blood, the port may need to be flushed: ? Before and after medicines have been given. ?   Before and after blood has been drawn. ? As part of routine maintenance. Flushing may be recommended every 4-6 weeks.  If a constant infusion is running, the port may not need to be flushed.  Throw away any syringes in a disposal container that is meant for sharp items (sharps container). You can buy a sharps container from a pharmacy, or you can make one by using an empty hard plastic bottle with a cover. How long will my port stay implanted? The port can stay in for as long as your health care provider thinks it is needed. When it is time for the port to come out, a surgery will be done to remove it. The surgery will be similar to the procedure that was done to put the port in. Follow these instructions at home:   Flush your port as told by your health care provider.  If you need an infusion over several days, follow instructions from your health care provider about how to take care of your port site. Make sure you: ? Wash your hands with soap and water before you change your dressing. If soap and water are not available, use alcohol-based hand sanitizer. ? Change your dressing as told by your health care provider. ? Place any used dressings or  infusion bags into a plastic bag. Throw that bag in the trash. ? Keep the dressing that covers the needle clean and dry. Do not get it wet. ? Do not use scissors or sharp objects near the tube. ? Keep the tube clamped, unless it is being used.  Check your port site every day for signs of infection. Check for: ? Redness, swelling, or pain. ? Fluid or blood. ? Pus or a bad smell.  Protect the skin around the port site. ? Avoid wearing bra straps that rub or irritate the site. ? Protect the skin around your port from seat belts. Place a soft pad over your chest if needed.  Bathe or shower as told by your health care provider. The site may get wet as long as you are not actively receiving an infusion.  Return to your normal activities as told by your health care provider. Ask your health care provider what activities are safe for you.  Carry a medical alert card or wear a medical alert bracelet at all times. This will let health care providers know that you have an implanted port in case of an emergency. Get help right away if:  You have redness, swelling, or pain at the port site.  You have fluid or blood coming from your port site.  You have pus or a bad smell coming from the port site.  You have a fever. Summary  Implanted ports are usually placed in the chest for long-term IV access.  Follow instructions from your health care provider about flushing the port and changing bandages (dressings).  Take care of the area around your port by avoiding clothing that puts pressure on the area, and by watching for signs of infection.  Protect the skin around your port from seat belts. Place a soft pad over your chest if needed.  Get help right away if you have a fever or you have redness, swelling, pain, drainage, or a bad smell at the port site. This information is not intended to replace advice given to you by your health care provider. Make sure you discuss any questions you have with  your health care provider. Document Revised: 10/23/2018 Document Reviewed: 08/03/2016 Elsevier   Patient Education  2020 Elsevier Inc. Moderate Conscious Sedation, Adult, Care After These instructions provide you with information about caring for yourself after your procedure. Your health care provider may also give you more specific instructions. Your treatment has been planned according to current medical practices, but problems sometimes occur. Call your health care provider if you have any problems or questions after your procedure. What can I expect after the procedure? After your procedure, it is common:  To feel sleepy for several hours.  To feel clumsy and have poor balance for several hours.  To have poor judgment for several hours.  To vomit if you eat too soon. Follow these instructions at home: For at least 24 hours after the procedure:   Do not: ? Participate in activities where you could fall or become injured. ? Drive. ? Use heavy machinery. ? Drink alcohol. ? Take sleeping pills or medicines that cause drowsiness. ? Make important decisions or sign legal documents. ? Take care of children on your own.  Rest. Eating and drinking  Follow the diet recommended by your health care provider.  If you vomit: ? Drink water, juice, or soup when you can drink without vomiting. ? Make sure you have little or no nausea before eating solid foods. General instructions  Have a responsible adult stay with you until you are awake and alert.  Take over-the-counter and prescription medicines only as told by your health care provider.  If you smoke, do not smoke without supervision.  Keep all follow-up visits as told by your health care provider. This is important. Contact a health care provider if:  You keep feeling nauseous or you keep vomiting.  You feel light-headed.  You develop a rash.  You have a fever. Get help right away if:  You have trouble  breathing. This information is not intended to replace advice given to you by your health care provider. Make sure you discuss any questions you have with your health care provider. Document Revised: 06/13/2017 Document Reviewed: 10/21/2015 Elsevier Patient Education  2020 Elsevier Inc.  

## 2020-03-07 NOTE — H&P (Signed)
Referring Physician(s): Feng,Yan  Supervising Physician: Jacqulynn Cadet  Patient Status:  [facility] OP  Chief Complaint: "I'm getting a port a cath"   Subjective: Patient familiar to IR service from bone marrow biopsy in 2017 and tunneled PICC removal on 02/15/2020.  She has a history of relapsing multiple myeloma and presents today for Port-A-Cath placement for additional chemotherapy.  She currently denies fever, headache, chest pain, dyspnea, abdominal pain, vomiting or abnormal bleeding.  She does have occasional cough, intermittent nausea and chronic back pain.  Additional medical history as below.  Past Medical History:  Diagnosis Date  . CAD (coronary artery disease)    a. inf STEMI (in [place] >>> [facility]) >>> LHC (8/15- [place]):  pRCA 60%, mid to dist RCA 50% >>> med rx  . Cardiomyopathy ([place])   . Cataract   . Chronic combined systolic and diastolic CHF (congestive heart failure) ([place])    cancer medication related   . Cord compression ([place]) 07/13/12   MRI- diffuse myeloma involvement of T-L spine  . History of radiation therapy 07/13/12-07/27/12   spinal cord compression T3-T10,left scapula  . 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 Multiple myeloma ([place]) 07/01/2012  . MVP (mitral valve prolapse)    a. trivial by echo 06/2017.  . OSTEOPOROSIS 06/11/2010   Multiple compression fractures; and spontaneous fracture of sternum Qualifier: Diagnosis of  By: [name], [name]     . Thoracic kyphosis 07/13/12   per MRI scan  . Unspecified deficiency anemia    Past Surgical History:  Procedure Laterality Date  . APPENDECTOMY    . CATARACT EXTRACTION, BILATERAL    . CESAREAN SECTION     x2   . COLONOSCOPY  2007   neg with Dr. [name]  . ELBOW SURGERY    . HIP SURGERY  2009   left  . IR REMOVAL TUN CV CATH W/O FL  02/15/2020  . TUBAL LIGATION        Allergies: Zithromax [azithromycin], Zosyn [piperacillin sod-tazobactam so], and  Quinolones  Medications: Prior to Admission medications   Medication Sig Start Date End Date Taking? Authorizing Provider  acetaminophen (TYLENOL) 500 MG tablet Take 500 mg by mouth every 6 (six) hours as needed for moderate pain or fever.    [provider]  acyclovir (ZOVIRAX) 400 MG tablet Take 1 tablet (400 mg total) by mouth 2 (two) times daily. 02/22/19   [name], MD  aspirin EC 81 MG tablet Take 1 tablet (81 mg total) by mouth daily. 04/01/18   Dunn, Nedra Hai, PA-C  atorvastatin (LIPITOR) 20 MG tablet TAKE 1 TABLET BY MOUTH  DAILY 01/05/20   [name], MD  calcium-vitamin D (OSCAL WITH D) 500-200 MG-UNIT TABS tablet Take by mouth.    [provider]  cyclophosphamide (CYTOXAN) 50 MG capsule Take 6 capsules (358m) by mouth once weekly on day of chemo for 2 weeks, then off for one week 02/17/20   FTruitt Merle MD  doxycycline (VIBRA-TABS) 100 MG tablet Take 100 mg by mouth as needed (treatment for pneumonia).     [provider]  furosemide (LASIX) 20 MG tablet Take 1 tablet (20 mg total) by mouth daily. For 5 days then as needed 02/11/20   FTruitt Merle MD  metoprolol succinate (TOPROL-XL) 25 MG 24 hr tablet TAKE 1 TABLET BY MOUTH  DAILY 11/08/19   NDorothy Spark MD  morphine (MSIR) 15 MG tablet Take 0.5 tablets (7.5 mg total)  by mouth every 8 (eight) hours as needed for severe pain. 02/04/20   [name], MD  ondansetron (ZOFRAN) 8 MG tablet Take 1 tablet (8 mg total) by mouth every 8 (eight) hours as needed for nausea or vomiting. 06/02/19   [name], MD  potassium chloride SA (KLOR-CON) 20 MEQ tablet Take 1 tablet (20 mEq total) by mouth 2 (two) times daily. 02/11/20   [name], MD  prochlorperazine (COMPAZINE) 10 MG tablet Take 1 tablet (10 mg total) by mouth every 6 (six) hours as needed (Nausea or vomiting). 06/14/19   [name], MD  triamcinolone cream (KENALOG) 0.1 % Apply 0.1 application topically 2 (two) times daily as needed. 02/02/20 02/01/21  [provider]     Vital Signs: Blood pressure 109/62, temperature 97.8, heart rate 68, respirations 16, O2 sat 100% room air   Physical Exam awake, alert.  Chest clear to auscultation bilaterally.  Heart with regular rate and rhythm.  Abdomen soft, positive bowel sounds, nontender.  Bilateral lower extremity pitting edema noted.  Imaging: No results found.  Labs:  CBC: Recent Labs    01/20/20 1313 02/04/20 0838 02/17/20 1015 03/02/20 1102  WBC 3.1* 3.2* 3.0* 4.0  HGB 8.8* 9.8* 9.2* 8.0*  HCT 27.1* 29.8* 28.0* 24.8*  PLT 162 120* 140* 127*    COAGS: No results for input(s): INR, APTT in the last 8760 hours.  BMP: Recent Labs    01/20/20 1313 02/04/20 0838 02/17/20 1015 03/02/20 1102  NA 142 141 137 137  K 4.1 3.8 3.7 4.0  CL 111 108 105 108  CO2 20* _0 GLUCOSE 86 87 98 98  BUN _1 CALCIUM 9.2 9.1 9.5 9.5  CREATININE 0.79 0.64 0.77 0.85  GFRNONAA >60 >60 >60 >60  GFRAA >60 >60 >60 >60    LIVER FUNCTION TESTS: Recent Labs    01/06/20 1307 01/20/20 1313 02/17/20 1015 03/02/20 1102  BILITOT 0.8 1.1 1.0 1.2  AST _2 ALT _3 ALKPHOS 53 50 101 110  PROT 5.9* 6.4* 6.3* 6.3*  ALBUMIN 3.9 4.1 3.7 3.5    Assessment and Plan: 79 yo female with history of relapsing multiple myeloma ;presents today for Port-A-Cath placement for additional chemotherapy. Risks and benefits of image guided port-a-catheter placement was discussed with the patient including, but not limited to bleeding, infection, pneumothorax, or fibrin sheath development and need for additional procedures.  All of the patient's questions were answered, patient is agreeable to proceed. Consent signed and in chart.     Electronically Signed: D. Rowe Robert, PA-C 03/07/2020, 10:20 AM   I spent a total of 25 minutes at the the patient's bedside AND on the patient's hospital floor or unit, greater than 50% of which was counseling/coordinating care for  Port-A-Cath placement

## 2020-03-09 ENCOUNTER — Inpatient Hospital Stay: Payer: Medicare Other

## 2020-03-09 ENCOUNTER — Other Ambulatory Visit: Payer: Self-pay

## 2020-03-09 ENCOUNTER — Other Ambulatory Visit: Payer: Self-pay | Admitting: Hematology

## 2020-03-09 DIAGNOSIS — C9002 Multiple myeloma in relapse: Secondary | ICD-10-CM | POA: Diagnosis not present

## 2020-03-09 DIAGNOSIS — C9000 Multiple myeloma not having achieved remission: Secondary | ICD-10-CM

## 2020-03-09 DIAGNOSIS — Z79899 Other long term (current) drug therapy: Secondary | ICD-10-CM | POA: Diagnosis not present

## 2020-03-09 DIAGNOSIS — D649 Anemia, unspecified: Secondary | ICD-10-CM

## 2020-03-09 DIAGNOSIS — Z95828 Presence of other vascular implants and grafts: Secondary | ICD-10-CM

## 2020-03-09 DIAGNOSIS — D630 Anemia in neoplastic disease: Secondary | ICD-10-CM

## 2020-03-09 DIAGNOSIS — Z9481 Bone marrow transplant status: Secondary | ICD-10-CM | POA: Diagnosis not present

## 2020-03-09 DIAGNOSIS — Z5112 Encounter for antineoplastic immunotherapy: Secondary | ICD-10-CM | POA: Diagnosis not present

## 2020-03-09 DIAGNOSIS — Z9221 Personal history of antineoplastic chemotherapy: Secondary | ICD-10-CM | POA: Diagnosis not present

## 2020-03-09 DIAGNOSIS — Z923 Personal history of irradiation: Secondary | ICD-10-CM | POA: Diagnosis not present

## 2020-03-09 DIAGNOSIS — Z7982 Long term (current) use of aspirin: Secondary | ICD-10-CM | POA: Diagnosis not present

## 2020-03-09 LAB — CBC WITH DIFFERENTIAL (CANCER CENTER ONLY)
Abs Immature Granulocytes: 0.15 10*3/uL — ABNORMAL HIGH (ref 0.00–0.07)
Basophils Absolute: 0 10*3/uL (ref 0.0–0.1)
Basophils Relative: 1 %
Eosinophils Absolute: 0.1 10*3/uL (ref 0.0–0.5)
Eosinophils Relative: 2 %
HCT: 22.6 % — ABNORMAL LOW (ref 36.0–46.0)
Hemoglobin: 7.3 g/dL — ABNORMAL LOW (ref 12.0–15.0)
Immature Granulocytes: 5 %
Lymphocytes Relative: 3 %
Lymphs Abs: 0.1 10*3/uL — ABNORMAL LOW (ref 0.7–4.0)
MCH: 35.1 pg — ABNORMAL HIGH (ref 26.0–34.0)
MCHC: 32.3 g/dL (ref 30.0–36.0)
MCV: 108.7 fL — ABNORMAL HIGH (ref 80.0–100.0)
Monocytes Absolute: 0.5 10*3/uL (ref 0.1–1.0)
Monocytes Relative: 14 %
Neutro Abs: 2.5 10*3/uL (ref 1.7–7.7)
Neutrophils Relative %: 75 %
Platelet Count: 55 10*3/uL — ABNORMAL LOW (ref 150–400)
RBC: 2.08 MIL/uL — ABNORMAL LOW (ref 3.87–5.11)
RDW: 18.6 % — ABNORMAL HIGH (ref 11.5–15.5)
WBC Count: 3.3 10*3/uL — ABNORMAL LOW (ref 4.0–10.5)
nRBC: 0 % (ref 0.0–0.2)

## 2020-03-09 LAB — CMP (CANCER CENTER ONLY)
ALT: 33 U/L (ref 0–44)
AST: 20 U/L (ref 15–41)
Albumin: 3.4 g/dL — ABNORMAL LOW (ref 3.5–5.0)
Alkaline Phosphatase: 95 U/L (ref 38–126)
Anion gap: 5 (ref 5–15)
BUN: 24 mg/dL — ABNORMAL HIGH (ref 8–23)
CO2: 24 mmol/L (ref 22–32)
Calcium: 9.5 mg/dL (ref 8.9–10.3)
Chloride: 109 mmol/L (ref 98–111)
Creatinine: 0.85 mg/dL (ref 0.44–1.00)
GFR, Est AFR Am: >60 mL/min (ref >60)
GFR, Estimated: >60 mL/min (ref >60)
Glucose, Bld: 128 mg/dL — ABNORMAL HIGH (ref 70–99)
Potassium: 4.1 mmol/L (ref 3.5–5.1)
Sodium: 138 mmol/L (ref 135–145)
Total Bilirubin: 1.2 mg/dL (ref 0.3–1.2)
Total Protein: 6 g/dL — ABNORMAL LOW (ref 6.5–8.1)

## 2020-03-09 LAB — PREPARE RBC (CROSSMATCH)

## 2020-03-09 LAB — SAMPLE TO BLOOD BANK

## 2020-03-09 MED ORDER — DIPHENHYDRAMINE HCL 25 MG PO CAPS
ORAL_CAPSULE | ORAL | Status: AC
  Filled 2020-03-09: qty 1

## 2020-03-09 MED ORDER — SODIUM CHLORIDE 0.9% FLUSH
10.0000 mL | Freq: Once | INTRAVENOUS | Status: AC | PRN
  Administered 2020-03-09: 10 mL
  Filled 2020-03-09: qty 10

## 2020-03-09 MED ORDER — DIPHENHYDRAMINE HCL 25 MG PO CAPS
25.0000 mg | ORAL_CAPSULE | Freq: Once | ORAL | Status: AC
  Administered 2020-03-09: 25 mg via ORAL

## 2020-03-09 MED ORDER — SODIUM CHLORIDE 0.9% IV SOLUTION
250.0000 mL | Freq: Once | INTRAVENOUS | Status: AC
  Administered 2020-03-09: 250 mL via INTRAVENOUS
  Filled 2020-03-09: qty 250

## 2020-03-09 MED ORDER — HEPARIN SOD (PORK) LOCK FLUSH 100 UNIT/ML IV SOLN
500.0000 [IU] | Freq: Every day | INTRAVENOUS | Status: AC | PRN
  Administered 2020-03-09: 500 [IU]
  Filled 2020-03-09: qty 5

## 2020-03-09 MED ORDER — ACETAMINOPHEN 325 MG PO TABS
650.0000 mg | ORAL_TABLET | Freq: Once | ORAL | Status: AC
  Administered 2020-03-09: 650 mg via ORAL

## 2020-03-09 MED ORDER — SODIUM CHLORIDE 0.9% FLUSH
10.0000 mL | INTRAVENOUS | Status: AC | PRN
  Administered 2020-03-09: 10 mL
  Filled 2020-03-09: qty 10

## 2020-03-09 MED ORDER — ACETAMINOPHEN 325 MG PO TABS
ORAL_TABLET | ORAL | Status: AC
  Filled 2020-03-09: qty 2

## 2020-03-09 NOTE — Patient Instructions (Signed)

## 2020-03-09 NOTE — Patient Instructions (Signed)

## 2020-03-10 LAB — TYPE AND SCREEN
ABO/RH(D): A POS
Antibody Screen: NEGATIVE
Unit division: 0

## 2020-03-10 LAB — BPAM RBC
Blood Product Expiration Date: 202109082359
ISSUE DATE / TIME: 202108261528
Unit Type and Rh: 6200

## 2020-03-13 DIAGNOSIS — Z961 Presence of intraocular lens: Secondary | ICD-10-CM | POA: Diagnosis not present

## 2020-03-13 DIAGNOSIS — H18613 Keratoconus, stable, bilateral: Secondary | ICD-10-CM | POA: Diagnosis not present

## 2020-03-13 DIAGNOSIS — H4423 Degenerative myopia, bilateral: Secondary | ICD-10-CM | POA: Diagnosis not present

## 2020-03-13 DIAGNOSIS — H0102B Squamous blepharitis left eye, upper and lower eyelids: Secondary | ICD-10-CM | POA: Diagnosis not present

## 2020-03-13 DIAGNOSIS — H0102A Squamous blepharitis right eye, upper and lower eyelids: Secondary | ICD-10-CM | POA: Diagnosis not present

## 2020-03-15 ENCOUNTER — Encounter (HOSPITAL_COMMUNITY): Payer: Self-pay | Admitting: Internal Medicine

## 2020-03-15 ENCOUNTER — Inpatient Hospital Stay (HOSPITAL_COMMUNITY)
Admission: EM | Admit: 2020-03-15 | Discharge: 2020-03-19 | DRG: 602 | Disposition: A | Discharge status: Hospice | Payer: Medicare Other | Attending: Family Medicine | Admitting: Family Medicine

## 2020-03-15 ENCOUNTER — Other Ambulatory Visit: Payer: Self-pay

## 2020-03-15 ENCOUNTER — Emergency Department (HOSPITAL_COMMUNITY): Payer: Medicare Other

## 2020-03-15 DIAGNOSIS — I10 Essential (primary) hypertension: Secondary | ICD-10-CM | POA: Diagnosis not present

## 2020-03-15 DIAGNOSIS — R0902 Hypoxemia: Secondary | ICD-10-CM | POA: Diagnosis not present

## 2020-03-15 DIAGNOSIS — I110 Hypertensive heart disease with heart failure: Secondary | ICD-10-CM | POA: Diagnosis present

## 2020-03-15 DIAGNOSIS — R609 Edema, unspecified: Secondary | ICD-10-CM

## 2020-03-15 DIAGNOSIS — J90 Pleural effusion, not elsewhere classified: Secondary | ICD-10-CM | POA: Diagnosis not present

## 2020-03-15 DIAGNOSIS — I5082 Biventricular heart failure: Secondary | ICD-10-CM | POA: Diagnosis present

## 2020-03-15 DIAGNOSIS — I5031 Acute diastolic (congestive) heart failure: Secondary | ICD-10-CM | POA: Diagnosis not present

## 2020-03-15 DIAGNOSIS — I471 Supraventricular tachycardia: Secondary | ICD-10-CM | POA: Diagnosis not present

## 2020-03-15 DIAGNOSIS — J9811 Atelectasis: Secondary | ICD-10-CM | POA: Diagnosis not present

## 2020-03-15 DIAGNOSIS — N179 Acute kidney failure, unspecified: Secondary | ICD-10-CM | POA: Diagnosis not present

## 2020-03-15 DIAGNOSIS — G9341 Metabolic encephalopathy: Secondary | ICD-10-CM

## 2020-03-15 DIAGNOSIS — S81801A Unspecified open wound, right lower leg, initial encounter: Secondary | ICD-10-CM | POA: Diagnosis not present

## 2020-03-15 DIAGNOSIS — A4152 Sepsis due to Pseudomonas: Secondary | ICD-10-CM | POA: Diagnosis not present

## 2020-03-15 DIAGNOSIS — I083 Combined rheumatic disorders of mitral, aortic and tricuspid valves: Secondary | ICD-10-CM | POA: Diagnosis present

## 2020-03-15 DIAGNOSIS — Z923 Personal history of irradiation: Secondary | ICD-10-CM | POA: Diagnosis not present

## 2020-03-15 DIAGNOSIS — R52 Pain, unspecified: Secondary | ICD-10-CM | POA: Diagnosis not present

## 2020-03-15 DIAGNOSIS — E785 Hyperlipidemia, unspecified: Secondary | ICD-10-CM | POA: Diagnosis present

## 2020-03-15 DIAGNOSIS — C9000 Multiple myeloma not having achieved remission: Secondary | ICD-10-CM | POA: Diagnosis present

## 2020-03-15 DIAGNOSIS — L03116 Cellulitis of left lower limb: Secondary | ICD-10-CM

## 2020-03-15 DIAGNOSIS — Z79899 Other long term (current) drug therapy: Secondary | ICD-10-CM

## 2020-03-15 DIAGNOSIS — B965 Pseudomonas (aeruginosa) (mallei) (pseudomallei) as the cause of diseases classified elsewhere: Secondary | ICD-10-CM | POA: Diagnosis not present

## 2020-03-15 DIAGNOSIS — I5041 Acute combined systolic (congestive) and diastolic (congestive) heart failure: Secondary | ICD-10-CM

## 2020-03-15 DIAGNOSIS — I252 Old myocardial infarction: Secondary | ICD-10-CM

## 2020-03-15 DIAGNOSIS — L03115 Cellulitis of right lower limb: Principal | ICD-10-CM | POA: Diagnosis present

## 2020-03-15 DIAGNOSIS — Z9481 Bone marrow transplant status: Secondary | ICD-10-CM | POA: Diagnosis not present

## 2020-03-15 DIAGNOSIS — R7881 Bacteremia: Secondary | ICD-10-CM | POA: Diagnosis present

## 2020-03-15 DIAGNOSIS — Z20822 Contact with and (suspected) exposure to covid-19: Secondary | ICD-10-CM | POA: Diagnosis present

## 2020-03-15 DIAGNOSIS — I5043 Acute on chronic combined systolic (congestive) and diastolic (congestive) heart failure: Secondary | ICD-10-CM | POA: Diagnosis present

## 2020-03-15 DIAGNOSIS — Z9889 Other specified postprocedural states: Secondary | ICD-10-CM

## 2020-03-15 DIAGNOSIS — I2510 Atherosclerotic heart disease of native coronary artery without angina pectoris: Secondary | ICD-10-CM | POA: Diagnosis not present

## 2020-03-15 DIAGNOSIS — R570 Cardiogenic shock: Secondary | ICD-10-CM | POA: Diagnosis not present

## 2020-03-15 DIAGNOSIS — Z743 Need for continuous supervision: Secondary | ICD-10-CM | POA: Diagnosis not present

## 2020-03-15 DIAGNOSIS — S22050A Wedge compression fracture of T5-T6 vertebra, initial encounter for closed fracture: Secondary | ICD-10-CM | POA: Diagnosis not present

## 2020-03-15 DIAGNOSIS — I429 Cardiomyopathy, unspecified: Secondary | ICD-10-CM | POA: Diagnosis not present

## 2020-03-15 DIAGNOSIS — Z7982 Long term (current) use of aspirin: Secondary | ICD-10-CM

## 2020-03-15 DIAGNOSIS — L0390 Cellulitis, unspecified: Secondary | ICD-10-CM | POA: Diagnosis present

## 2020-03-15 DIAGNOSIS — R54 Age-related physical debility: Secondary | ICD-10-CM | POA: Diagnosis present

## 2020-03-15 DIAGNOSIS — S2220XA Unspecified fracture of sternum, initial encounter for closed fracture: Secondary | ICD-10-CM | POA: Diagnosis not present

## 2020-03-15 DIAGNOSIS — R6889 Other general symptoms and signs: Secondary | ICD-10-CM | POA: Diagnosis not present

## 2020-03-15 DIAGNOSIS — I5021 Acute systolic (congestive) heart failure: Secondary | ICD-10-CM | POA: Diagnosis not present

## 2020-03-15 DIAGNOSIS — R531 Weakness: Secondary | ICD-10-CM | POA: Diagnosis not present

## 2020-03-15 DIAGNOSIS — R5381 Other malaise: Secondary | ICD-10-CM | POA: Diagnosis not present

## 2020-03-15 DIAGNOSIS — D649 Anemia, unspecified: Secondary | ICD-10-CM | POA: Diagnosis not present

## 2020-03-15 DIAGNOSIS — D61818 Other pancytopenia: Secondary | ICD-10-CM | POA: Diagnosis not present

## 2020-03-15 DIAGNOSIS — C9002 Multiple myeloma in relapse: Secondary | ICD-10-CM | POA: Diagnosis present

## 2020-03-15 DIAGNOSIS — R6521 Severe sepsis with septic shock: Secondary | ICD-10-CM | POA: Diagnosis not present

## 2020-03-15 DIAGNOSIS — Z515 Encounter for palliative care: Secondary | ICD-10-CM | POA: Diagnosis not present

## 2020-03-15 DIAGNOSIS — D696 Thrombocytopenia, unspecified: Secondary | ICD-10-CM

## 2020-03-15 DIAGNOSIS — Z66 Do not resuscitate: Secondary | ICD-10-CM | POA: Diagnosis not present

## 2020-03-15 DIAGNOSIS — I509 Heart failure, unspecified: Secondary | ICD-10-CM

## 2020-03-15 DIAGNOSIS — C801 Malignant (primary) neoplasm, unspecified: Secondary | ICD-10-CM | POA: Diagnosis not present

## 2020-03-15 DIAGNOSIS — I50810 Right heart failure, unspecified: Secondary | ICD-10-CM | POA: Diagnosis not present

## 2020-03-15 DIAGNOSIS — M7989 Other specified soft tissue disorders: Secondary | ICD-10-CM | POA: Diagnosis not present

## 2020-03-15 DIAGNOSIS — M810 Age-related osteoporosis without current pathological fracture: Secondary | ICD-10-CM | POA: Diagnosis present

## 2020-03-15 DIAGNOSIS — I517 Cardiomegaly: Secondary | ICD-10-CM | POA: Diagnosis not present

## 2020-03-15 DIAGNOSIS — E876 Hypokalemia: Secondary | ICD-10-CM | POA: Diagnosis not present

## 2020-03-15 DIAGNOSIS — S2243XA Multiple fractures of ribs, bilateral, initial encounter for closed fracture: Secondary | ICD-10-CM | POA: Diagnosis not present

## 2020-03-15 DIAGNOSIS — Z7189 Other specified counseling: Secondary | ICD-10-CM | POA: Diagnosis not present

## 2020-03-15 LAB — CBC WITH DIFFERENTIAL/PLATELET
Abs Immature Granulocytes: 0.1 10*3/uL — ABNORMAL HIGH (ref 0.00–0.07)
Basophils Absolute: 0 10*3/uL (ref 0.0–0.1)
Basophils Relative: 0 %
Eosinophils Absolute: 0 10*3/uL (ref 0.0–0.5)
Eosinophils Relative: 0 %
HCT: 26.9 % — ABNORMAL LOW (ref 36.0–46.0)
Hemoglobin: 8.8 g/dL — ABNORMAL LOW (ref 12.0–15.0)
Immature Granulocytes: 1 %
Lymphocytes Relative: 3 %
Lymphs Abs: 0.2 10*3/uL — ABNORMAL LOW (ref 0.7–4.0)
MCH: 34.8 pg — ABNORMAL HIGH (ref 26.0–34.0)
MCHC: 32.7 g/dL (ref 30.0–36.0)
MCV: 106.3 fL — ABNORMAL HIGH (ref 80.0–100.0)
Monocytes Absolute: 0.7 10*3/uL (ref 0.1–1.0)
Monocytes Relative: 10 %
Neutro Abs: 6.3 10*3/uL (ref 1.7–7.7)
Neutrophils Relative %: 86 %
Platelets: 131 10*3/uL — ABNORMAL LOW (ref 150–400)
RBC: 2.53 MIL/uL — ABNORMAL LOW (ref 3.87–5.11)
RDW: 20.1 % — ABNORMAL HIGH (ref 11.5–15.5)
WBC: 7.4 10*3/uL (ref 4.0–10.5)
nRBC: 0 % (ref 0.0–0.2)

## 2020-03-15 LAB — BASIC METABOLIC PANEL
Anion gap: 11 (ref 5–15)
BUN: 23 mg/dL (ref 8–23)
CO2: 19 mmol/L — ABNORMAL LOW (ref 22–32)
Calcium: 9.1 mg/dL (ref 8.9–10.3)
Chloride: 108 mmol/L (ref 98–111)
Creatinine, Ser: 0.8 mg/dL (ref 0.44–1.00)
GFR calc Af Amer: >60 mL/min (ref >60)
GFR calc non Af Amer: >60 mL/min (ref >60)
Glucose, Bld: 121 mg/dL — ABNORMAL HIGH (ref 70–99)
Potassium: 3.8 mmol/L (ref 3.5–5.1)
Sodium: 138 mmol/L (ref 135–145)

## 2020-03-15 LAB — SARS CORONAVIRUS 2 BY RT PCR (HOSPITAL ORDER, PERFORMED IN ~~LOC~~ HOSPITAL LAB): SARS Coronavirus 2: NEGATIVE

## 2020-03-15 LAB — BRAIN NATRIURETIC PEPTIDE: B Natriuretic Peptide: 2409.6 pg/mL — ABNORMAL HIGH (ref 0.0–100.0)

## 2020-03-15 LAB — LACTIC ACID, PLASMA: Lactic Acid, Venous: 1.7 mmol/L (ref 0.5–1.9)

## 2020-03-15 MED ORDER — SODIUM CHLORIDE 0.9 % IV SOLN
1.0000 g | Freq: Two times a day (BID) | INTRAVENOUS | Status: DC
  Administered 2020-03-15 – 2020-03-16 (×2): 1 g via INTRAVENOUS
  Filled 2020-03-15 (×2): qty 1

## 2020-03-15 MED ORDER — DEXTROSE 5 % IV SOLN
400.0000 mg | Freq: Two times a day (BID) | INTRAVENOUS | Status: DC
  Administered 2020-03-16 – 2020-03-17 (×5): 400 mg via INTRAVENOUS
  Filled 2020-03-15 (×9): qty 8

## 2020-03-15 MED ORDER — FUROSEMIDE 10 MG/ML IJ SOLN
20.0000 mg | Freq: Once | INTRAMUSCULAR | Status: AC
  Administered 2020-03-15: 20 mg via INTRAVENOUS
  Filled 2020-03-15: qty 4

## 2020-03-15 MED ORDER — ATORVASTATIN CALCIUM 10 MG PO TABS
20.0000 mg | ORAL_TABLET | Freq: Every day | ORAL | Status: DC
  Administered 2020-03-17: 20 mg via ORAL
  Filled 2020-03-15 (×3): qty 2

## 2020-03-15 MED ORDER — ACETAMINOPHEN 500 MG PO TABS: 500.0000 mg | ORAL_TABLET | Freq: Four times a day (QID) | ORAL | Status: DC | PRN

## 2020-03-15 MED ORDER — METOPROLOL TARTRATE 5 MG/5ML IV SOLN: 5.0000 mg | Freq: Four times a day (QID) | INTRAVENOUS | Status: DC | PRN

## 2020-03-15 MED ORDER — PROCHLORPERAZINE MALEATE 10 MG PO TABS: 10.0000 mg | ORAL_TABLET | Freq: Four times a day (QID) | ORAL | Status: DC | PRN

## 2020-03-15 MED ORDER — ONDANSETRON HCL 8 MG PO TABS: 8.0000 mg | ORAL_TABLET | Freq: Three times a day (TID) | ORAL | Status: DC | PRN

## 2020-03-15 MED ORDER — ACYCLOVIR 400 MG PO TABS
400.0000 mg | ORAL_TABLET | Freq: Two times a day (BID) | ORAL | Status: DC
  Filled 2020-03-15: qty 1

## 2020-03-15 MED ORDER — POTASSIUM CHLORIDE CRYS ER 20 MEQ PO TBCR
20.0000 meq | EXTENDED_RELEASE_TABLET | Freq: Two times a day (BID) | ORAL | Status: DC
  Filled 2020-03-15: qty 1

## 2020-03-15 MED ORDER — VANCOMYCIN HCL IN DEXTROSE 1-5 GM/200ML-% IV SOLN
1000.0000 mg | Freq: Once | INTRAVENOUS | Status: AC
  Administered 2020-03-15: 1000 mg via INTRAVENOUS
  Filled 2020-03-15: qty 200

## 2020-03-15 MED ORDER — CALCIUM CARBONATE-VITAMIN D 500-200 MG-UNIT PO TABS
1.0000 | ORAL_TABLET | Freq: Every day | ORAL | Status: DC
  Administered 2020-03-17: 1 via ORAL
  Filled 2020-03-15 (×4): qty 1

## 2020-03-15 MED ORDER — CEFAZOLIN SODIUM-DEXTROSE 1-4 GM/50ML-% IV SOLN
1.0000 g | Freq: Once | INTRAVENOUS | Status: AC
  Administered 2020-03-15: 1 g via INTRAVENOUS
  Filled 2020-03-15: qty 50

## 2020-03-15 MED ORDER — SODIUM CHLORIDE 0.9 % IV SOLN: INTRAVENOUS | Status: DC

## 2020-03-15 MED ORDER — ASPIRIN EC 81 MG PO TBEC
81.0000 mg | DELAYED_RELEASE_TABLET | Freq: Every day | ORAL | Status: DC
  Administered 2020-03-17: 81 mg via ORAL
  Filled 2020-03-15 (×3): qty 1

## 2020-03-15 MED ORDER — VANCOMYCIN HCL 500 MG/100ML IV SOLN
500.0000 mg | Freq: Two times a day (BID) | INTRAVENOUS | Status: DC
  Filled 2020-03-15: qty 100

## 2020-03-15 MED ORDER — METOPROLOL SUCCINATE ER 25 MG PO TB24
25.0000 mg | ORAL_TABLET | Freq: Every day | ORAL | Status: DC
  Filled 2020-03-15 (×2): qty 1

## 2020-03-15 MED ORDER — SODIUM CHLORIDE 0.9 % IV SOLN: 1.0000 g | Freq: Once | INTRAVENOUS | Status: DC

## 2020-03-15 MED ORDER — ENOXAPARIN SODIUM 40 MG/0.4ML ~~LOC~~ SOLN
40.0000 mg | SUBCUTANEOUS | Status: DC
  Administered 2020-03-15 – 2020-03-16 (×2): 40 mg via SUBCUTANEOUS
  Filled 2020-03-15 (×2): qty 0.4

## 2020-03-15 MED ORDER — SODIUM CHLORIDE 0.9 % IV BOLUS
1000.0000 mL | Freq: Once | INTRAVENOUS | Status: AC
  Administered 2020-03-15: 1000 mL via INTRAVENOUS

## 2020-03-15 MED ORDER — ALBUMIN HUMAN 5 % IV SOLN
12.5000 g | Freq: Once | INTRAVENOUS | Status: AC
  Administered 2020-03-15: 12.5 g via INTRAVENOUS
  Filled 2020-03-15: qty 250

## 2020-03-15 MED ORDER — FUROSEMIDE 40 MG PO TABS
20.0000 mg | ORAL_TABLET | Freq: Every day | ORAL | Status: DC
  Filled 2020-03-15 (×2): qty 1

## 2020-03-15 MED ORDER — KETOROLAC TROMETHAMINE 30 MG/ML IJ SOLN
15.0000 mg | Freq: Once | INTRAMUSCULAR | Status: AC
  Administered 2020-03-15: 15 mg via INTRAVENOUS
  Filled 2020-03-15: qty 1

## 2020-03-15 MED ORDER — METOPROLOL TARTRATE 5 MG/5ML IV SOLN
5.0000 mg | Freq: Four times a day (QID) | INTRAVENOUS | Status: DC | PRN
  Administered 2020-03-15: 5 mg via INTRAVENOUS
  Filled 2020-03-15: qty 5

## 2020-03-15 NOTE — ED Provider Notes (Addendum)
[facility] DEPT Provider Note   CSN: [id number] Arrival date & time: 03/15/20  1007     History Chief Complaint  Patient presents with  . Fatigue    Shannon Rush is a 79 y.o. female.  Pt presents to the ED today with right leg pain.  Pt has a hx of multiple myeloma that was diagnosed initially in 2013.  She relapsed in 2017 and progressed in October of 2020.  She had her last chemo ( Kyprolis, Cytoxan, Dexa) on 8/5.  She was admitted at [facility] on 7/15 for LLE cellulitis.  She developed right leg edema on 7/30.  Doppler then was negative.  She was put on lasix and swelling improved.  Since she saw her oncologist (Dr. Burr Medico), her right leg has gotten more painful and swollen.  The last few days have been very painful.  She normally takes 1/4 of her morphine pill, but had to take the full dose last night and this am.  RLE has also been weeping.  No f/c.         Past Medical History:  Diagnosis Date  . CAD (coronary artery disease)    a. inf STEMI (in [place] >>> [facility]ytics) >>> LHC (8/15- [place]):  pRCA 60%, mid to dist RCA 50% >>> med rx  . Cardiomyopathy ([place])   . Cataract   . Chronic combined systolic and diastolic CHF (congestive heart failure) ([place])    cancer medication related   . Cord compression ([place]) 07/13/12   MRI- diffuse myeloma involvement of T-L spine  . History of radiation therapy 07/13/12-07/27/12   spinal cord compression T3-T10,left scapula  . Infection 01/29/2020   left leg infection- [facility] at [facility]  . 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 Multiple myeloma ([place]) 07/01/2012  . MVP (mitral valve prolapse)    a. trivial by echo 06/2017.  . OSTEOPOROSIS 06/11/2010   Multiple compression fractures; and spontaneous fracture of sternum Qualifier: Diagnosis of  By: [name], [name]     . Thoracic kyphosis 07/13/12   per MRI scan  . Unspecified deficiency anemia     Patient Active Problem List   Diagnosis Date  Noted  . Cellulitis 03/15/2020  . Acute diastolic CHF (congestive heart failure) ([place]) 03/15/2020  . Acute metabolic encephalopathy 59/56/3875  . Essential hypertension 03/15/2020  . Cellulitis, leg 02/04/2020  . TIA (transient ischemic attack) 12/22/2018  . Vertigo 11/24/2018  . Hammer toe of right foot 11/24/2018  . Delayed wound healing 11/24/2018  . Acute bronchitis 08/29/2018  . Closed fracture of multiple pubic rami ([place]) 01/27/2018  . Closed fracture of multiple pubic rami, initial encounter ([place]) 01/27/2018  . Port-A-Cath in place 11/21/2017  . Goals of care, counseling/discussion 02/28/2017  . Leg edema, right 12/04/2016  . Non-ischemic cardiomyopathy ([place]) 12/04/2016  . Antineoplastic chemotherapy induced anemia 03/28/2016  . Diastolic dysfunction-grade 2 03/27/2016  . Patient on antineoplastic chemotherapy regimen   . Adrenocortical insufficiency ([place])   . Drug-induced neutropenia ([place]) 03/11/2016  . HCAP (healthcare-associated pneumonia) 03/10/2016  . Corn of foot 11/04/2014  . Protein-calorie malnutrition ([place]) 09/23/2014  . Pancytopenia ([place]) 08/10/2014  . CAD -50-60% RCA Aug 2015 03/11/2014  . History of STEMI-Aug 2015- secondary to thrombotic state from chemo 03/10/2014  . Unspecified vitamin D deficiency 05/05/2013  . History of peripheral stem cell transplant ([place]) 03/05/2013  . History of organ or tissue transplant 03/05/2013  . Swelling, mass, or lump in chest 12/24/2012  .  Multiple myeloma ([place]) 07/01/2012  . Sciatica of left side 06/03/2011  . Osteoporosis 06/11/2010  . Anemia, macrocytic 06/02/2009  . DJD, UNSPECIFIED 09/11/2006    Past Surgical History:  Procedure Laterality Date  . APPENDECTOMY    . CATARACT EXTRACTION, BILATERAL    . CESAREAN SECTION     x2   . COLONOSCOPY  2007   neg with Dr. [name]  . ELBOW SURGERY    . HIP SURGERY  2009   left  . IR IMAGING GUIDED PORT INSERTION  03/07/2020  . IR REMOVAL TUN CV CATH W/O FL  02/15/2020  .  TUBAL LIGATION       OB History   No obstetric history on file.     Family History  Problem Relation Age of Onset  . Glaucoma Mother   . Heart disease Mother   . Peripheral vascular disease Mother   . Raynaud syndrome Mother   . Heart disease Father   . Heart failure Father   . Glaucoma Father   . Cancer Brother        stage IV prostate CA  . Prostate cancer Brother   . Raynaud syndrome Daughter   . Raynaud syndrome Daughter   . Breast cancer Neg Hx   . Colon cancer Neg Hx     Social History   Tobacco Use  . Smoking status: Never Smoker  . Smokeless tobacco: Never Used  Vaping Use  . Vaping Use: Never used  Substance Use Topics  . Alcohol use: Yes    Alcohol/week: 7.0 standard drinks    Types: 7 Standard drinks or equivalent per week    Comment: 1 glass of wine or beer 3-5 days per week  . Drug use: No    Home Medications Prior to Admission medications   Medication Sig Start Date End Date Taking? Authorizing Provider  acetaminophen (TYLENOL) 500 MG tablet Take 500 mg by mouth every 6 (six) hours as needed for moderate pain or fever.    [provider]  acyclovir (ZOVIRAX) 400 MG tablet Take 1 tablet (400 mg total) by mouth 2 (two) times daily. 02/22/19   [name], MD  aspirin EC 81 MG tablet Take 1 tablet (81 mg total) by mouth daily. 04/01/18   Dunn, Nedra Hai, PA-C  atorvastatin (LIPITOR) 20 MG tablet TAKE 1 TABLET BY MOUTH  DAILY 01/05/20   [name], MD  calcium-vitamin D (OSCAL WITH D) 500-200 MG-UNIT TABS tablet Take by mouth.    [provider]  cyclophosphamide (CYTOXAN) 50 MG capsule Take 6 capsules (311m) by mouth once weekly on day of chemo for 2 weeks, then off for one week 02/17/20   FTruitt Merle MD  doxycycline (VIBRA-TABS) 100 MG tablet Take 100 mg by mouth as needed (treatment for pneumonia).     [provider]  furosemide (LASIX) 20 MG tablet Take 1 tablet (20 mg total) by mouth daily. For 5 days then as needed 02/11/20    FTruitt Merle MD  metoprolol succinate (TOPROL-XL) 25 MG 24 hr tablet TAKE 1 TABLET BY MOUTH  DAILY 11/08/19   NDorothy Spark MD  morphine (MSIR) 15 MG tablet Take 0.5 tablets (7.5 mg total) by mouth every 8 (eight) hours as needed for severe pain. 02/04/20   FTruitt Merle MD  ondansetron (ZOFRAN) 8 MG tablet Take 1 tablet (8 mg total) by mouth every 8 (eight) hours as needed for nausea or vomiting. 06/02/19   FTruitt Merle MD  potassium chloride  SA (KLOR-CON) 20 MEQ tablet Take 1 tablet (20 mEq total) by mouth 2 (two) times daily. 02/11/20   [name], MD  prochlorperazine (COMPAZINE) 10 MG tablet Take 1 tablet (10 mg total) by mouth every 6 (six) hours as needed (Nausea or vomiting). 06/14/19   [name], MD  triamcinolone cream (KENALOG) 0.1 % Apply 0.1 application topically 2 (two) times daily as needed. 02/02/20 02/01/21  [provider]    Allergies    Zithromax [azithromycin], Zosyn [piperacillin sod-tazobactam so], and Quinolones  Review of Systems   Review of Systems  Musculoskeletal:       Right leg pain  Skin: Positive for color change and wound.  All other systems reviewed and are negative.   Physical Exam Updated Vital Signs BP 120/64   Pulse 95   Temp 97.9 F (36.6 C) (Oral)   Resp 16   Ht _0  (1.676 m)   Wt 51.9 kg   SpO2 99%   BMI 18.47 kg/m   Physical Exam Vitals and nursing note reviewed.  Constitutional:      Appearance: Normal appearance.  HENT:     Head: Normocephalic and atraumatic.     Right Ear: External ear normal.     Left Ear: External ear normal.     Nose: Nose normal.     Mouth/Throat:     Mouth: Mucous membranes are moist.     Pharynx: Oropharynx is clear.  Eyes:     Extraocular Movements: Extraocular movements intact.     Conjunctiva/sclera: Conjunctivae normal.     Pupils: Pupils are equal, round, and reactive to light.  Cardiovascular:     Rate and Rhythm: Normal rate.     Pulses: Normal pulses.     Heart sounds: Normal heart  sounds.  Pulmonary:     Effort: Pulmonary effort is normal.     Breath sounds: Normal breath sounds.    Abdominal:     General: Abdomen is flat. Bowel sounds are normal.     Palpations: Abdomen is soft.  Musculoskeletal:     Cervical back: Normal range of motion and neck supple.     Right lower leg: Edema present.  Skin:    Capillary Refill: Capillary refill takes less than 2 seconds.     Comments: Cellulitis to RLE.  Sore posterior calf which is draining a clear fluid.  Neurological:     General: No focal deficit present.     Mental Status: She is alert.     Comments: Pt is a little sleepy from the morphine tablet, but is oriented.  Psychiatric:        Mood and Affect: Mood normal.        Behavior: Behavior normal.       ED Results / Procedures / Treatments   Labs (all labs ordered are listed, but only abnormal results are displayed) Labs Reviewed  BASIC METABOLIC PANEL - Abnormal; Notable for the following components:      Result Value   CO2 19 (*)    Glucose, Bld 121 (*)    All other components within normal limits  CBC WITH DIFFERENTIAL/PLATELET - Abnormal; Notable for the following components:   RBC 2.53 (*)    Hemoglobin 8.8 (*)    HCT 26.9 (*)    MCV 106.3 (*)    MCH 34.8 (*)    RDW 20.1 (*)    Platelets 131 (*)    Lymphs Abs 0.2 (*)    Abs Immature Granulocytes 0.10 (*)  All other components within normal limits  BRAIN NATRIURETIC PEPTIDE - Abnormal; Notable for the following components:   B Natriuretic Peptide 2,409.6 (*)    All other components within normal limits  SARS CORONAVIRUS 2 BY RT PCR (HOSPITAL ORDER, [facility])  CULTURE, BLOOD (ROUTINE X 2)  AEROBIC CULTURE (SUPERFICIAL SPECIMEN)  LACTIC ACID, PLASMA    EKG EKG Interpretation  Date/Time:  Wednesday March 15 2020 15:24:49 EDT Ventricular Rate:  95 PR Interval:    QRS Duration: 100 QT Interval:  335 QTC Calculation: 422 R Axis:   115 Text  Interpretation: Sinus tachycardia Atrial premature complexes in couplets Right axis deviation Repol abnrm suggests ischemia, diffuse leads No significant change since last tracing Confirmed by Isla Pence [phone]) on 03/15/2020 3:28:42 PM   Radiology DG Chest 2 View  Result Date: 03/15/2020 CLINICAL DATA:  Weakness and right leg pain. EXAM: CHEST - 2 VIEW COMPARISON:  08/18/2018 FINDINGS: Patchy bilateral airspace disease. Cardiomegaly and vascular pedicle widening. There is a small right pleural effusion. No pneumothorax. Porta catheter with tip at the right atrium. Remote spinal, rib, and left clavicle fracture deformities. There is history of multiple myeloma with diffuse demineralization. IMPRESSION: Bilateral airspace disease that could be infection or failure. Small right pleural effusion interval cardiomegaly favors failure. Electronically Signed   By: Monte Fantasia M.D.   On: 03/15/2020 11:05   DG Tibia/Fibula Right  Result Date: 03/15/2020 CLINICAL DATA:  Right leg pain with multiple wounds. Multiple myeloma. EXAM: RIGHT TIBIA AND FIBULA - 2 VIEW COMPARISON:  None. FINDINGS: Nonspecific subcutaneous reticulation. No evidence of fracture. There is generalized osteopenia without visible myeloma in the covered skeleton. IMPRESSION: Generalized soft tissue swelling without acute or malignant osseous finding. Electronically Signed   By: Monte Fantasia M.D.   On: 03/15/2020 11:06   VAS [place] LOWER EXTREMITY VENOUS (DVT) (ONLY [facility] & [facility])  Result Date: 03/15/2020  Lower Venous DVTStudy Indications: Edema.  Limitations: Body habitus, poor ultrasound/tissue interface and patient position. Comparison Study: No prior study Performing Technologist: Maudry Mayhew MHA, RDMS, RVT, RDCS  Examination Guidelines: A complete evaluation includes B-mode imaging, spectral Doppler, color Doppler, and power Doppler as needed of all accessible portions of each vessel. Bilateral testing is considered an integral part  of a complete examination. Limited examinations for reoccurring indications may be performed as noted. The reflux portion of the exam is performed with the patient in reverse Trendelenburg.  +---------+---------------+---------+-----------+----------+--------------+ RIGHT    CompressibilityPhasicitySpontaneityPropertiesThrombus Aging +---------+---------------+---------+-----------+----------+--------------+ CFV      Full           No       Yes                                 +---------+---------------+---------+-----------+----------+--------------+ SFJ      Full                                                        +---------+---------------+---------+-----------+----------+--------------+ FV Prox  Full                                                        +---------+---------------+---------+-----------+----------+--------------+  FV Mid   Full                                                        +---------+---------------+---------+-----------+----------+--------------+ FV DistalFull                                                        +---------+---------------+---------+-----------+----------+--------------+ PFV      Full                                                        +---------+---------------+---------+-----------+----------+--------------+ POP      Full           No       Yes                                 +---------+---------------+---------+-----------+----------+--------------+ PTV      Full                                                        +---------+---------------+---------+-----------+----------+--------------+ PERO     Full                                                        +---------+---------------+---------+-----------+----------+--------------+   +---------+---------------+---------+-----------+----------+--------------+ LEFT     CompressibilityPhasicitySpontaneityPropertiesThrombus Aging  +---------+---------------+---------+-----------+----------+--------------+ CFV      Full           No       Yes                                 +---------+---------------+---------+-----------+----------+--------------+ SFJ      Full                                                        +---------+---------------+---------+-----------+----------+--------------+ FV Prox  Full                                                        +---------+---------------+---------+-----------+----------+--------------+ FV Mid   Full                                                        +---------+---------------+---------+-----------+----------+--------------+  FV DistalFull                                                        +---------+---------------+---------+-----------+----------+--------------+ PFV      Full                                                        +---------+---------------+---------+-----------+----------+--------------+ POP      Full           No       Yes                                 +---------+---------------+---------+-----------+----------+--------------+ PTV      Full                                                        +---------+---------------+---------+-----------+----------+--------------+ PERO     Full                                                        +---------+---------------+---------+-----------+----------+--------------+     Summary: RIGHT: - There is no evidence of deep vein thrombosis in the lower extremity.  - No cystic structure found in the popliteal fossa.  LEFT: - There is no evidence of deep vein thrombosis in the lower extremity.  - A cystic structure is found in the popliteal fossa.  Bilateral lower extremity veins exhibit pulsatile flow, suggestive of possibly elevated right heart pressure.  *See table(s) above for measurements and observations. Electronically signed by Curt Jews MD on 03/15/2020 at 3:12:07  PM.    Final     Procedures Procedures (including critical care time)  Medications Ordered in ED Medications  meropenem (MERREM) 1 g in sodium chloride 0.9 % 100 mL IVPB (1 g Intravenous New Bag/Given 03/15/20 1526)  vancomycin (VANCOREADY) IVPB 500 mg/100 mL (has no administration in time range)  sodium chloride 0.9 % bolus 1,000 mL (0 mLs Intravenous Stopped 03/15/20 1344)  ceFAZolin (ANCEF) IVPB 1 g/50 mL premix (0 g Intravenous Stopped 03/15/20 1207)  vancomycin (VANCOCIN) IVPB 1000 mg/200 mL premix (0 mg Intravenous Stopped 03/15/20 1344)    ED Course  I have reviewed the triage vital signs and the nursing notes.  Pertinent labs & imaging results that were available during my care of the patient were reviewed by me and considered in my medical decision making (see chart for details).    MDM Rules/Calculators/A&P                          [place] negative for DVTs.  Pt given ancef and vancomycin for cellulitis.  Wound to posterior calf cultured.    Pt initially given 1L IVFs for infection and possible sepsis.  However, CXR  shows  probable failure.  Maintenance of NS cancelled.    Pt d/w Dr. Neysa Bonito (triad) for admission.  I attempted to call the husband, but there was no answer.  I left a message.  Covid neg.  Shannon Rush was evaluated in Emergency Department on 03/15/2020 for the symptoms described in the history of present illness. She was evaluated in the context of the global COVID-19 pandemic, which necessitated consideration that the patient might be at risk for infection with the SARS-CoV-2 virus that causes COVID-19. Institutional protocols and algorithms that pertain to the evaluation of patients at risk for COVID-19 are in a state of rapid change based on information released by regulatory bodies including the CDC and federal and state organizations. These policies and algorithms were followed during the patient's care in the ED.  Final Clinical Impression(s) / ED Diagnoses Final  diagnoses:  Multiple myeloma ([place])  Cellulitis of right lower extremity  Thrombocytopenia (HCC)  Anemia, unspecified type  Congestive heart failure, unspecified HF chronicity, unspecified heart failure type [facility])    Rx / DC Orders ED Discharge Orders    None       Isla Pence, MD 03/15/20 1417    Isla Pence, MD 03/15/20 1529

## 2020-03-15 NOTE — Progress Notes (Signed)
A consult was received from an ED physician for vancomycin per pharmacy dosing.  The patient's profile has been reviewed for ht/wt/allergies/indication/available labs.    A one time order has been placed for vancomycin 1000 mg IV once.    Further antibiotics/pharmacy consults should be ordered by admitting physician if indicated.                       Thank you, Lenis Noon, PharmD 03/15/2020  10:41 AM

## 2020-03-15 NOTE — ED Notes (Signed)
Patient speaking with both daughters on the phone. Patient has asked doctor on call to give any information regarding her care or plan of care to her daughter Laycie Schriner [phone]). Notified M. Sharlet Salina. She stated that she would call daughter as soon as she was able.

## 2020-03-15 NOTE — Progress Notes (Signed)
Bilateral lower extremity venous duplex completed. Refer to "CV Proc" under chart review to view preliminary results.  03/15/2020 1:54 PM Kelby Aline., MHA, RVT, RDCS, RDMS

## 2020-03-15 NOTE — ED Notes (Signed)
Shannon Rush daughter ph # [phone] request call with update of condition.

## 2020-03-15 NOTE — Progress Notes (Signed)
Pharmacy Antibiotic Note  Shannon Rush is a 79 y.o. female admitted on 03/15/2020 with cellulitis.  PMH significant for multiple myeloma and recent treatment of cellulitis.  Patient received Vancomycin 1gm and Cefazolin 1gm IV x 1 dose each in the ED.  Upon admission, Pharmacy has been consulted for Vancomycin and Meropenem dosing.  Plan:  Vancomycin 555m IV q12h  Meropenem 1gm IV q12h  Follow renal function  Follow culture results/sensitivities  Monitor vancomycin levels as needed  Height: 5' 6"  (167.6 cm) Weight: 51.9 kg (114 lb 6.7 oz) IBW/kg (Calculated) : 59.3  Temp (24hrs), Avg:97.9 F (36.6 C), Min:97.9 F (36.6 C), Max:97.9 F (36.6 C)  Recent Labs  Lab 03/09/20 1305 03/15/20 1021  WBC 3.3* 7.4  CREATININE 0.85 0.80  LATICACIDVEN  --  1.7    Estimated Creatinine Clearance: 48.3 mL/min (by C-G formula based on SCr of 0.8 mg/dL).    Allergies  Allergen Reactions  . Zithromax [Azithromycin] Rash    Full body rash  . Zosyn [Piperacillin Sod-Tazobactam So] Swelling and Rash    Itching, rash and swelling of extremities Tolerates Rocephin  . Quinolones Rash    Cipro, Levaquin implicated Cipro also caused vomiting and abd pain    Antimicrobials this admission: 9/1 Cefazolin x 1 9/1 Vanc >>   9/1 Meropenem >>  Dose adjustments this admission:    Microbiology results: 9/1 BCx: sent 9/1 Wound Cx:   9/1 Covid: negative  Thank you for allowing pharmacy to be a part of this patient's care.  PEverette Rank PharmD 03/15/2020 2:53 PM

## 2020-03-15 NOTE — Progress Notes (Signed)
SLP Cancellation Note  Patient Details Name: Shannon Rush MRN: [id number] DOB: Sep 06, 1940   Cancelled treatment:       Reason Eval/Treat Not Completed: Fatigue/lethargy limiting ability to participate;Medical issues which prohibited therapy (pt currently with elevated RR, HR, moaning and reporting pain in right leg.  RN informed and reached out to MD for pain medicine for pt.  RN reports pt has been somnolent most of the day.  Will defer swallow evaluation due to pt's mentation, RR, HR, discomfort.  PT on morphine prior to admit per notes and with acute encephalopathy possibly due to medications per notes.  Will follow up 03/16/2020.    Shannon Lime, MS [facility] SLP Acute Rehab Services Office [phone]   Macario Golds 03/15/2020, 6:00 PM

## 2020-03-15 NOTE — ED Notes (Signed)
Pt became tachycardiac >110 bpm. Gave metoprolol and HR came down to 101-103 bpm

## 2020-03-15 NOTE — H&P (Addendum)
History and Physical        Hospital Admission Note Date: 03/15/2020  Patient name: Shannon Rush Medical record number: [id number] Date of birth: 1940-11-04 Age: 79 y.o. Gender: female  PCP: [name], MD  Patient coming from: Home Lives with: Husband At baseline, ambulates: Independently  Chief Complaint    Chief Complaint  Patient presents with  . Fatigue      HPI:   History obtained from prior notes and husband at bedside as patient continues to fall asleep during encounter  This is a 79 year old female with past medical history of multiple myeloma on Cytoxan, Kyprolis, Dexa (follows with Dr. Burr Medico), diastolic heart failure (EF 55 to 60% in April), CAD, inferior STEMI in 2015, pancytopenia, osteoporosis who was admitted to [facility] on 7/15 for left lower extremity cellulitis and developed right leg edema on 7/30 with a negative Doppler at the time and was put on Lasix for swelling which initially improved who has had increasing pain and swelling of the RLE over the past few days as well as fatigue.  No relief despite home morphine, normally takes one quarter of her morphine pill but had to take the full dose last night and this morning.  Has noted some weeping from her extremity.  Per husband no fever or chills, no abdominal pain, no chest pain, shortness of breath, nausea or vomiting.  Patient continued to fall asleep during exam and was unable to provide a full ROS  ED Course: Vitals: Hemodynamically stable on room air.  Notable labs: Bicarb 19, glucose 121, BNP 2409, lactic acid 1.7, WBC 7.4, Hb 8.8, platelets 131, COVID-19 negative.  Bilateral lower extremity ultrasound: Negative for DVT but suggestive of possible elevated right heart pressure.  Received 1 L NS bolus for infection and possible sepsis however CXR was concerning for probable heart failure and further fluids  were discontinued.  Was given vancomycin and Ancef x1  Vitals:   03/15/20 1330 03/15/20 1500  BP: 122/61 120/64  Pulse: 95 95  Resp: 18 16  Temp:    SpO2: 99% 99%     Review of Systems:  Review of Systems  Unable to perform ROS: Mental acuity    Medical/Social/Family History   Past Medical History: Past Medical History:  Diagnosis Date  . CAD (coronary artery disease)    a. inf STEMI (in [place] >>> [facility]) >>> LHC (8/15- [place]):  pRCA 60%, mid to dist RCA 50% >>> med rx  . Cardiomyopathy ([place])   . Cataract   . Chronic combined systolic and diastolic CHF (congestive heart failure) ([place])    cancer medication related   . Cord compression ([place]) 07/13/12   MRI- diffuse myeloma involvement of T-L spine  . History of radiation therapy 07/13/12-07/27/12   spinal cord compression T3-T10,left scapula  . Infection 01/29/2020   left leg infection- [facility]  . 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 Multiple myeloma ([place]) 07/01/2012  . MVP (mitral valve prolapse)    a. trivial by echo 06/2017.  . OSTEOPOROSIS 06/11/2010   Multiple compression fractures; and spontaneous fracture of sternum Qualifier: Diagnosis of  By: [name], [name]     .  Thoracic kyphosis 07/13/12   per MRI scan  . Unspecified deficiency anemia     Past Surgical History:  Procedure Laterality Date  . APPENDECTOMY    . CATARACT EXTRACTION, BILATERAL    . CESAREAN SECTION     x2   . COLONOSCOPY  2007   neg with Dr. [name]  . ELBOW SURGERY    . HIP SURGERY  2009   left  . IR IMAGING GUIDED PORT INSERTION  03/07/2020  . IR REMOVAL TUN CV CATH W/O FL  02/15/2020  . TUBAL LIGATION      Medications: Prior to Admission medications   Medication Sig Start Date End Date Taking? Authorizing Provider  acetaminophen (TYLENOL) 500 MG tablet Take 500 mg by mouth every 6 (six) hours as needed for moderate pain or fever.    [provider]  acyclovir (ZOVIRAX) 400 MG tablet Take 1  tablet (400 mg total) by mouth 2 (two) times daily. 02/22/19   [name], MD  aspirin EC 81 MG tablet Take 1 tablet (81 mg total) by mouth daily. 04/01/18   Dunn, Nedra Hai, PA-C  atorvastatin (LIPITOR) 20 MG tablet TAKE 1 TABLET BY MOUTH  DAILY 01/05/20   [name], MD  calcium-vitamin D (OSCAL WITH D) 500-200 MG-UNIT TABS tablet Take by mouth.    [provider]  cyclophosphamide (CYTOXAN) 50 MG capsule Take 6 capsules (343m) by mouth once weekly on day of chemo for 2 weeks, then off for one week 02/17/20   FTruitt Merle MD  doxycycline (VIBRA-TABS) 100 MG tablet Take 100 mg by mouth as needed (treatment for pneumonia).     [provider]  furosemide (LASIX) 20 MG tablet Take 1 tablet (20 mg total) by mouth daily. For 5 days then as needed 02/11/20   FTruitt Merle MD  metoprolol succinate (TOPROL-XL) 25 MG 24 hr tablet TAKE 1 TABLET BY MOUTH  DAILY 11/08/19   NDorothy Spark MD  morphine (MSIR) 15 MG tablet Take 0.5 tablets (7.5 mg total) by mouth every 8 (eight) hours as needed for severe pain. 02/04/20   FTruitt Merle MD  ondansetron (ZOFRAN) 8 MG tablet Take 1 tablet (8 mg total) by mouth every 8 (eight) hours as needed for nausea or vomiting. 06/02/19   FTruitt Merle MD  potassium chloride SA (KLOR-CON) 20 MEQ tablet Take 1 tablet (20 mEq total) by mouth 2 (two) times daily. 02/11/20   FTruitt Merle MD  prochlorperazine (COMPAZINE) 10 MG tablet Take 1 tablet (10 mg total) by mouth every 6 (six) hours as needed (Nausea or vomiting). 06/14/19   FTruitt Merle MD  triamcinolone cream (KENALOG) 0.1 % Apply 0.1 application topically 2 (two) times daily as needed. 02/02/20 02/01/21  [provider]    Allergies:   Allergies  Allergen Reactions  . Zithromax [Azithromycin] Rash    Full body rash  . Zosyn [Piperacillin Sod-Tazobactam So] Swelling and Rash    Itching, rash and swelling of extremities Tolerates Rocephin  . Quinolones Rash    Cipro, Levaquin implicated Cipro also caused  vomiting and abd pain    Social History:  reports that she has never smoked. She has never used smokeless tobacco. She reports current alcohol use of about 7.0 standard drinks of alcohol per week. She reports that she does not use drugs.  Family History: Family History  Problem Relation Age of Onset  . Glaucoma Mother   . Heart disease Mother   . Peripheral vascular disease Mother   .  Raynaud syndrome Mother   . Heart disease Father   . Heart failure Father   . Glaucoma Father   . Cancer Brother        stage IV prostate CA  . Prostate cancer Brother   . Raynaud syndrome Daughter   . Raynaud syndrome Daughter   . Breast cancer Neg Hx   . Colon cancer Neg Hx      Objective   Physical Exam: Blood pressure 120/64, pulse 95, temperature 97.9 F (36.6 C), temperature source Oral, resp. rate 16, height 5' 6"  (1.676 m), weight 51.9 kg, SpO2 99 %.  Physical Exam Vitals and nursing note reviewed. Exam conducted with a chaperone present.  Constitutional:      General: She is not in acute distress.    Appearance: She is not diaphoretic.  HENT:     Head: Normocephalic.  Eyes:     Pupils: Pupils are equal, round, and reactive to light.  Cardiovascular:     Rate and Rhythm: Normal rate and regular rhythm.     Heart sounds: No murmur heard.   Pulmonary:     Effort: Pulmonary effort is normal.     Breath sounds: Normal breath sounds. No wheezing.  Abdominal:     General: Abdomen is flat.     Palpations: Abdomen is soft.  Musculoskeletal:     Comments: 1+ bilateral lower extremity pitting edema  Skin:    Findings: Rash present.     Comments: See picture of lower extremities RLE with warmth and erythema  Neurological:     Comments: Somnolent       LABS on Admission: I have personally reviewed all the labs and imaging below    Basic Metabolic Panel: Recent Labs  Lab 03/09/20 1305 03/15/20 1021  NA 138 138  K 4.1 3.8  CL 109 108  CO2 24 19*  GLUCOSE 128* 121*    BUN 24* 23  CREATININE 0.85 0.80  CALCIUM 9.5 9.1   Liver Function Tests: Recent Labs  Lab 03/09/20 1305  AST 20  ALT 33  ALKPHOS 95  BILITOT 1.2  PROT 6.0*  ALBUMIN 3.4*   No results for input(s): LIPASE, AMYLASE in the last 168 hours. No results for input(s): AMMONIA in the last 168 hours. CBC: Recent Labs  Lab 03/09/20 1305 03/09/20 1305 03/15/20 1021  WBC 3.3*  --  7.4  NEUTROABS 2.5   < > 6.3  HGB 7.3*  --  8.8*  HCT 22.6*  --  26.9*  MCV 108.7*   < > 106.3*  PLT 55*  --  131*   < > = values in this interval not displayed.   Cardiac Enzymes: No results for input(s): CKTOTAL, CKMB, CKMBINDEX, TROPONINI in the last 168 hours. BNP: Invalid input(s): POCBNP CBG: No results for input(s): GLUCAP in the last 168 hours.  Radiological Exams on Admission:  DG Chest 2 View  Result Date: 03/15/2020 CLINICAL DATA:  Weakness and right leg pain. EXAM: CHEST - 2 VIEW COMPARISON:  08/18/2018 FINDINGS: Patchy bilateral airspace disease. Cardiomegaly and vascular pedicle widening. There is a small right pleural effusion. No pneumothorax. Porta catheter with tip at the right atrium. Remote spinal, rib, and left clavicle fracture deformities. There is history of multiple myeloma with diffuse demineralization. IMPRESSION: Bilateral airspace disease that could be infection or failure. Small right pleural effusion interval cardiomegaly favors failure. Electronically Signed   By: Monte Fantasia M.D.   On: 03/15/2020 11:05   DG Tibia/Fibula Right  Result Date: 03/15/2020 CLINICAL DATA:  Right leg pain with multiple wounds. Multiple myeloma. EXAM: RIGHT TIBIA AND FIBULA - 2 VIEW COMPARISON:  None. FINDINGS: Nonspecific subcutaneous reticulation. No evidence of fracture. There is generalized osteopenia without visible myeloma in the covered skeleton. IMPRESSION: Generalized soft tissue swelling without acute or malignant osseous finding. Electronically Signed   By: Monte Fantasia M.D.   On:  03/15/2020 11:06   VAS [place] LOWER EXTREMITY VENOUS (DVT) (ONLY [facility] & [facility])  Result Date: 03/15/2020  Lower Venous DVTStudy Indications: Edema.  Limitations: Body habitus, poor ultrasound/tissue interface and patient position. Comparison Study: No prior study Performing Technologist: Maudry Mayhew MHA, RDMS, RVT, RDCS  Examination Guidelines: A complete evaluation includes B-mode imaging, spectral Doppler, color Doppler, and power Doppler as needed of all accessible portions of each vessel. Bilateral testing is considered an integral part of a complete examination. Limited examinations for reoccurring indications may be performed as noted. The reflux portion of the exam is performed with the patient in reverse Trendelenburg.  +---------+---------------+---------+-----------+----------+--------------+ RIGHT    CompressibilityPhasicitySpontaneityPropertiesThrombus Aging +---------+---------------+---------+-----------+----------+--------------+ CFV      Full           No       Yes                                 +---------+---------------+---------+-----------+----------+--------------+ SFJ      Full                                                        +---------+---------------+---------+-----------+----------+--------------+ FV Prox  Full                                                        +---------+---------------+---------+-----------+----------+--------------+ FV Mid   Full                                                        +---------+---------------+---------+-----------+----------+--------------+ FV DistalFull                                                        +---------+---------------+---------+-----------+----------+--------------+ PFV      Full                                                        +---------+---------------+---------+-----------+----------+--------------+ POP      Full           No       Yes                                  +---------+---------------+---------+-----------+----------+--------------+  PTV      Full                                                        +---------+---------------+---------+-----------+----------+--------------+ PERO     Full                                                        +---------+---------------+---------+-----------+----------+--------------+   +---------+---------------+---------+-----------+----------+--------------+ LEFT     CompressibilityPhasicitySpontaneityPropertiesThrombus Aging +---------+---------------+---------+-----------+----------+--------------+ CFV      Full           No       Yes                                 +---------+---------------+---------+-----------+----------+--------------+ SFJ      Full                                                        +---------+---------------+---------+-----------+----------+--------------+ FV Prox  Full                                                        +---------+---------------+---------+-----------+----------+--------------+ FV Mid   Full                                                        +---------+---------------+---------+-----------+----------+--------------+ FV DistalFull                                                        +---------+---------------+---------+-----------+----------+--------------+ PFV      Full                                                        +---------+---------------+---------+-----------+----------+--------------+ POP      Full           No       Yes                                 +---------+---------------+---------+-----------+----------+--------------+ PTV      Full                                                        +---------+---------------+---------+-----------+----------+--------------+  PERO     Full                                                         +---------+---------------+---------+-----------+----------+--------------+     Summary: RIGHT: - There is no evidence of deep vein thrombosis in the lower extremity.  - No cystic structure found in the popliteal fossa.  LEFT: - There is no evidence of deep vein thrombosis in the lower extremity.  - A cystic structure is found in the popliteal fossa.  Bilateral lower extremity veins exhibit pulsatile flow, suggestive of possibly elevated right heart pressure.  *See table(s) above for measurements and observations. Electronically signed by Curt Jews MD on 03/15/2020 at 3:12:07 PM.    Final       EKG: Not done   A & P   Principal Problem:   Cellulitis Active Problems:   Multiple myeloma ([place])   Acute diastolic CHF (congestive heart failure) ([place])   Acute metabolic encephalopathy   Essential hypertension   1. RLE nonpurulent cellulitis, concern for severe disease due to immunosuppression a. Afebrile and hemodynamically stable on room air without a leukocytosis but immunosuppressed given her multiple myeloma on chemotherapy b. Bilateral lower extremity Doppler preliminary result negative for DVT c. Vancomycin and meropenem per severe nonpurulent cellulitis order set d. Follow-up blood culture  2. Suspected diastolic heart failure exacerbation a. Bilateral lower extremity 1+ pitting edema with significantly elevated BNP 2400 and concern for heart failure on CXR.  Also with findings suggestive of increased right heart pressure on bilateral lower extremity Doppler b. Last echo in April with EF 55 to 60% c. Lasix 20 mg IV x1 then continue home Lasix 20 mg p.o. daily d. Limited echo to evaluate EF e. EKG f. Telemetry g. Continue home Toprol h. Monitor daily weights and intake/output  3. Acute encephalopathy of unknown etiology, possibly medication induced a. Typically takes 1/4 home morphine but took a full pill yesterday and this a.m. due to RLE pain b. Hold patient's home morphine for  now c. N.p.o. for now d. SLP eval e. Delirium precautions  4. Multiple myeloma a. will notify oncology that the patient is hospitalized  5. Hyperlipidemia a. Continue statin  6. Hypertension a. Continue Toprol   DVT prophylaxis: Lovenox   Code Status: Prior  Diet: N.p.o. Family Communication: Admission, patients condition and plan of care including tests being ordered have been discussed with the patient who indicates understanding and agrees with the plan and Code Status. Patient's husband at bedside was updated  Disposition Plan: The appropriate patient status for this patient is INPATIENT. Inpatient status is judged to be reasonable and necessary in order to provide the required intensity of service to ensure the patient's safety. The patient's presenting symptoms, physical exam findings, and initial radiographic and laboratory data in the context of their chronic comorbidities is felt to place them at high risk for further clinical deterioration. Furthermore, it is not anticipated that the patient will be medically stable for discharge from the hospital within 2 midnights of admission. The following factors support the patient status of inpatient.   " The patient's presenting symptoms include fatigue, right lower extremity pain. " The worrisome physical exam findings include somnolence, lower extremity swelling, RLE rash. " The initial radiographic and laboratory data are worrisome because of CXR  with findings concerning for heart failure. " The chronic co-morbidities include multiple myeloma, diastolic heart failure, CAD.   * I certify that at the point of admission it is my clinical judgment that the patient will require inpatient hospital care spanning beyond 2 midnights from the point of admission due to high intensity of service, high risk for further deterioration and high frequency of surveillance required.*   Status is: Inpatient  Remains inpatient appropriate  because:Altered mental status, IV treatments appropriate due to intensity of illness or inability to take PO and Inpatient level of care appropriate due to severity of illness   Dispo: The patient is from: Home              Anticipated d/c is to: Home              Anticipated d/c date is: 3 days              Patient currently is not medically stable to d/c.        The medical decision making on this patient was of high complexity and the patient is at high risk for clinical deterioration, therefore this is a level 3  admission.  Consultants  . Hematology/oncology  Procedures  . None  Time Spent on Admission: 75 minutes    Harold Hedge, DO [facility] Pager [phone] 03/15/2020, 3:24 PM

## 2020-03-15 NOTE — ED Notes (Signed)
Husband bedside

## 2020-03-15 NOTE — ED Triage Notes (Signed)
Pt came from home via EMS  C/c: R lower medial leg pain from weeping wound, unrelieved by morphine  PMH: multiple myeloma Hypotensive on scene while supine  No IV access, Veins been overused d/t cancer.  Port on R. Chest  CBG: 122

## 2020-03-15 NOTE — ED Notes (Signed)
Pt awoke from sleep reporting sever pain. Gave ketorolac for pain.

## 2020-03-16 ENCOUNTER — Inpatient Hospital Stay (HOSPITAL_COMMUNITY): Payer: Medicare Other

## 2020-03-16 ENCOUNTER — Other Ambulatory Visit (HOSPITAL_COMMUNITY): Payer: Medicare Other

## 2020-03-16 ENCOUNTER — Encounter (HOSPITAL_COMMUNITY): Payer: Self-pay | Admitting: Internal Medicine

## 2020-03-16 DIAGNOSIS — L03115 Cellulitis of right lower limb: Principal | ICD-10-CM

## 2020-03-16 DIAGNOSIS — G9341 Metabolic encephalopathy: Secondary | ICD-10-CM

## 2020-03-16 DIAGNOSIS — I509 Heart failure, unspecified: Secondary | ICD-10-CM

## 2020-03-16 DIAGNOSIS — I5031 Acute diastolic (congestive) heart failure: Secondary | ICD-10-CM

## 2020-03-16 DIAGNOSIS — B965 Pseudomonas (aeruginosa) (mallei) (pseudomallei) as the cause of diseases classified elsewhere: Secondary | ICD-10-CM

## 2020-03-16 DIAGNOSIS — R7881 Bacteremia: Secondary | ICD-10-CM | POA: Diagnosis present

## 2020-03-16 DIAGNOSIS — I5021 Acute systolic (congestive) heart failure: Secondary | ICD-10-CM

## 2020-03-16 DIAGNOSIS — I50810 Right heart failure, unspecified: Secondary | ICD-10-CM

## 2020-03-16 DIAGNOSIS — C9002 Multiple myeloma in relapse: Secondary | ICD-10-CM

## 2020-03-16 LAB — BLOOD CULTURE ID PANEL (REFLEXED) - BCID2

## 2020-03-16 LAB — CBC
HCT: 24.1 % — ABNORMAL LOW (ref 36.0–46.0)
Hemoglobin: 8.1 g/dL — ABNORMAL LOW (ref 12.0–15.0)
MCH: 35.4 pg — ABNORMAL HIGH (ref 26.0–34.0)
MCHC: 33.6 g/dL (ref 30.0–36.0)
MCV: 105.2 fL — ABNORMAL HIGH (ref 80.0–100.0)
Platelets: 102 10*3/uL — ABNORMAL LOW (ref 150–400)
RBC: 2.29 MIL/uL — ABNORMAL LOW (ref 3.87–5.11)
RDW: 20.5 % — ABNORMAL HIGH (ref 11.5–15.5)
WBC: 2.3 10*3/uL — ABNORMAL LOW (ref 4.0–10.5)
nRBC: 0 % (ref 0.0–0.2)

## 2020-03-16 LAB — D-DIMER, QUANTITATIVE: D-Dimer, Quant: 1.64 ug/mL-FEU — ABNORMAL HIGH (ref 0.00–0.50)

## 2020-03-16 LAB — BASIC METABOLIC PANEL
Anion gap: 10 (ref 5–15)
BUN: 31 mg/dL — ABNORMAL HIGH (ref 8–23)
CO2: 19 mmol/L — ABNORMAL LOW (ref 22–32)
Calcium: 8.4 mg/dL — ABNORMAL LOW (ref 8.9–10.3)
Chloride: 109 mmol/L (ref 98–111)
Creatinine, Ser: 1.17 mg/dL — ABNORMAL HIGH (ref 0.44–1.00)
GFR calc Af Amer: 52 mL/min — ABNORMAL LOW (ref >60)
GFR calc non Af Amer: 45 mL/min — ABNORMAL LOW (ref >60)
Glucose, Bld: 97 mg/dL (ref 70–99)
Potassium: 3.6 mmol/L (ref 3.5–5.1)
Sodium: 138 mmol/L (ref 135–145)

## 2020-03-16 LAB — ECHOCARDIOGRAM LIMITED
Calc EF: 32.8 %
Height: 66 in
Single Plane A2C EF: 40 %
Single Plane A4C EF: 33 %
Weight: 1830.7 oz

## 2020-03-16 LAB — SODIUM, URINE, RANDOM: Sodium, Ur: 111 mmol/L

## 2020-03-16 LAB — MRSA PCR SCREENING: MRSA by PCR: NEGATIVE

## 2020-03-16 LAB — TROPONIN I (HIGH SENSITIVITY)
Troponin I (High Sensitivity): 86 ng/L — ABNORMAL HIGH (ref <18)
Troponin I (High Sensitivity): 90 ng/L — ABNORMAL HIGH (ref <18)

## 2020-03-16 LAB — LACTIC ACID, PLASMA: Lactic Acid, Venous: 1.8 mmol/L (ref 0.5–1.9)

## 2020-03-16 MED ORDER — LIP MEDEX EX OINT
TOPICAL_OINTMENT | CUTANEOUS | Status: DC | PRN
  Filled 2020-03-16 (×2): qty 7

## 2020-03-16 MED ORDER — ORAL CARE MOUTH RINSE
15.0000 mL | Freq: Two times a day (BID) | OROMUCOSAL | Status: DC
  Administered 2020-03-16 – 2020-03-18 (×3): 15 mL via OROMUCOSAL

## 2020-03-16 MED ORDER — ALBUMIN HUMAN 5 % IV SOLN
12.5000 g | Freq: Once | INTRAVENOUS | Status: AC
  Administered 2020-03-16: 12.5 g via INTRAVENOUS
  Filled 2020-03-16: qty 250

## 2020-03-16 MED ORDER — SODIUM CHLORIDE 0.9 % IV SOLN
INTRAVENOUS | Status: DC | PRN
  Administered 2020-03-16: 250 mL via INTRAVENOUS

## 2020-03-16 MED ORDER — LACTATED RINGERS IV BOLUS
500.0000 mL | Freq: Once | INTRAVENOUS | Status: AC
  Administered 2020-03-16: 500 mL via INTRAVENOUS

## 2020-03-16 MED ORDER — IOHEXOL 350 MG/ML SOLN
75.0000 mL | Freq: Once | INTRAVENOUS | Status: AC | PRN
  Administered 2020-03-16: 75 mL via INTRAVENOUS

## 2020-03-16 MED ORDER — CHLORHEXIDINE GLUCONATE CLOTH 2 % EX PADS
6.0000 | MEDICATED_PAD | Freq: Every day | CUTANEOUS | Status: DC
  Administered 2020-03-18: 6 via TOPICAL

## 2020-03-16 MED ORDER — MIDODRINE HCL 5 MG PO TABS
5.0000 mg | ORAL_TABLET | Freq: Three times a day (TID) | ORAL | Status: DC
  Administered 2020-03-16 – 2020-03-17 (×5): 5 mg via ORAL
  Filled 2020-03-16 (×10): qty 1

## 2020-03-16 MED ORDER — SODIUM CHLORIDE 0.9 % IV SOLN
2.0000 g | Freq: Two times a day (BID) | INTRAVENOUS | Status: DC
  Administered 2020-03-16 – 2020-03-17 (×4): 2 g via INTRAVENOUS
  Filled 2020-03-16 (×7): qty 2

## 2020-03-16 MED ORDER — HYDROCORTISONE NA SUCCINATE PF 100 MG IJ SOLR
50.0000 mg | Freq: Four times a day (QID) | INTRAMUSCULAR | Status: DC
  Administered 2020-03-16 – 2020-03-18 (×8): 50 mg via INTRAVENOUS
  Filled 2020-03-16 (×8): qty 2

## 2020-03-16 NOTE — Consult Note (Addendum)
NAME:  Shannon Rush, MRN:  [id number], DOB:  October 31, 1940, LOS: 1 ADMISSION DATE:  03/15/2020, CONSULTATION DATE: 9/2 REFERRING MD:  Bonner Puna, CHIEF COMPLAINT:  shock   Brief History   79 year old female patient with history of multiple myeloma on chemotherapy admitted on 9/1 with right lower extremity cellulitis and gram-negative rod bacteremia, complicated by newly identified systolic cardiomyopathy.  Pulmonary/critical care asked to see due to ongoing hypotension in spite of conservative volume replacement  History of present illness   79 year old female patient with history of multiple myeloma status post bone marrow transplant followed at [facility] and by Dr. Burr Medico here.  Currently undergoing active chemotherapy.  Presents to the emergency room on 9/1 with chief complaint of fatigue, weakness, and significant right lower extremity pain and lower extremity edema.  She had had no significant fever, chills, shortness of breath, nausea or vomiting however had been more somnolent.  In the emergency room was found to have bicarbonate level 19, BNP level 2409, normal lactic acid, and white blood cell count of 7.4.  She had lower extremity ultrasound these were negative for DVT.  She was initially normotensive but became hypotensive in the emergency room requiring 1 L saline bolus, she was started on vancomycin and Ancef initially in the emergency room with working diagnosis of sepsis, however echocardiogram was also obtained showing new ejection fraction 35 to 40%.  Chest x-ray showed diffuse bilateral airspace disease however she was not requiring supplemental oxygen.  Blood cultures preliminarily suggesting Pseudomonas.  In spite of IV hydration systolic blood pressure still remains in the mid to low 80s in spite of 1 L fluid bolus, at time of consultation we recorded she had received approximately 25.6 mL/kg of her body weight and IV fluid resuscitation.  Because of the mixed picture of what seemed to be both  septic and cardiogenic shock critical care was asked to evaluate  Past Medical History  Multiple myeloma on Cytoxan, Kyrolis, and DEXA.  Diastolic heart failure with a EF 55 to 60%, coronary artery disease with prior STEMI 2015, pancytopenia, osteoporosis.  Had been admitted to [facility] July 2021 for lower extremity cellulitis.  Significant Hospital Events   9/1 admitted.  Given 1 L of crystalloid.  Initiated on Ancef and vancomycin.  Remained hypotensive, given fluid challenges over the evening hours 9/2: EF 35 to 40%.  Intermittently encephalopathic.  Systolic blood pressure remaining in the 80s although awake and oriented on time of critical care eval.  Preliminary blood cultures suggest Pseudomonas, antibiotics changed to cefepime.  Critical care consulted.  Consults:  Hematology Cardiology Critical care  Procedures:    Significant Diagnostic Tests:  *Echocardiogram obtained on 9/2 shows abnormal septal motion/flattening of the inferior wall with hypokinesis.  LV EF estimated 35 to 40% marked RV dysfunction.   Elevated RV size.  Moderately elevated pulmonary artery systolic pressure, left atrial size mildly dilated right atrial size moderately dilated right atrial pressure estimated at 50 mmHg  Micro Data:  9/1 blood cultures x2  Antimicrobials:  9/1 acyclovir 9/1 cefazolin x1 dose Meropenem 9/1 x 1 Vancomycin 9/1 x 1 Cefepime 9/2>>>  Interim history/subjective:  Awake no distress  Objective   Blood pressure (Abnormal) 89/54, pulse 81, temperature 97.9 F (36.6 C), temperature source Oral, resp. rate 15, height 5' 6" (1.676 m), weight 51.9 kg, SpO2 96 %.        Intake/Output Summary (Last 24 hours) at 03/16/2020 1107 Last data filed at 03/15/2020 1707 Gross per 24 hour  Intake  1330 ml  Output no documentation  Net 1330 ml   Filed Weights   03/15/20 1026  Weight: 51.9 kg    Examination: General: Chronically ill-appearing 79 year old female she is awake, speech  slightly slurred, mucous membranes dry HENT: Normocephalic atraumatic mucous membranes dry neck veins slightly elevated Lungs: Currently room air, crackles bases, no accessory use Cardiovascular: Tachycardic regular rhythm without murmur rub or gallop Abdomen: Soft not tender Extremities: Right lower extremity is erythemic and swollen, also painful to touch Neuro: She is awake oriented x3, but often travels often thoughts.  Speech is somewhat slurred.  No focal motor deficits appreciated GU: Due to void  Assessment & Plan:  Severe sepsis in the setting of gram-negative rod bacteremia likely secondary to right lower extremity cellulitis -She has received 25 mL/kg predicted body weight IV fluid resuscitation.  Her exam is a little challenging, clinically she looks a little dry still, however she does have some mild JVD, and chest x-ray shows diffuse pulmonary infiltrates which could reflect pulmonary edema.  Also her BNP is greater than 2000 Plan Continue IV cefepime and await sensitivities Repeating blood chemistry, as well as lactic acid these were both normal previously, if renal function worse, or lactate trending up I think we should initiate vasoactive drips she already has a port. Check urine sodium, perhaps this will give [place] some insight to her volume status, she has not had any Lasix since yesterday Given her chest x-ray and new cardiomyopathy will address fluid resuscitation is very conservative manner We will shoot for systolic blood pressure goal greater than 90 Hold home furosemide Admit to ICU/stepdown Adding low dose midodrine to attempt to limit IVF volume after bolus Add stress dose steroids (gets dexa w/ chemo)  New systolic cardiomyopathy EF 35 to 40% Plan Hold diuresis Holding beta-blockade given shock Telemetry monitoring Additional evaluation as directed and recommended by cardiology  Bilateral pulmonary infiltrates Etiology not clear.  Certainly could reflect  pulmonary edema may also reflect a mix of both edema and acute lung injury She is not currently requiring supplemental oxygen Plan Continuous pulse oximetry Conservative fluid resuscitation strategy Repeat x-ray a.m. Agree reasonable to check swallowing evaluation  Mild acute metabolic encephalopathy -Per chart review and husband it does appear as though this is worse when she is more hypotensive.  She is currently awake and oriented but still not quite at baseline cognitive status Plan Hold antihypertensives/diuretics Minimal narcotics Supportive care  Pancytopenia: Hemoglobin 8.1, platelets 102, white blood cell count 2.3 Plan Monitor  history of multiple myeloma Plan Oncology notified   Best practice:  Diet: Heart healthy diet Pain/Anxiety/Delirium protocol (if indicated): Not applicable VAP protocol (if indicated): Not applicable DVT prophylaxis: Enoxaparin GI prophylaxis: Not applicable Glucose control: Not applicable Mobility: With assist Code Status: Do not resuscitate Family Communication: updated at bedside Disposition:  ICU/stepdown l Labs   CBC: Recent Labs  Lab 03/09/20 1305 03/15/20 1021 03/16/20 0551  WBC 3.3* 7.4 2.3*  NEUTROABS 2.5 6.3  --   HGB 7.3* 8.8* 8.1*  HCT 22.6* 26.9* 24.1*  MCV 108.7* 106.3* 105.2*  PLT 55* 131* 102*    Basic Metabolic Panel: Recent Labs  Lab 03/09/20 1305 03/15/20 1021  NA 138 138  K 4.1 3.8  CL 109 108  CO2 24 19*  GLUCOSE 128* 121*  BUN 24* 23  CREATININE 0.85 0.80  CALCIUM 9.5 9.1   GFR: Estimated Creatinine Clearance: 48.3 mL/min (by C-G formula based on SCr of 0.8 mg/dL). Recent Labs  Lab 03/09/20 1305 03/15/20 1021 03/16/20 0551  WBC 3.3* 7.4 2.3*  LATICACIDVEN  --  1.7  --     Liver Function Tests: Recent Labs  Lab 03/09/20 1305  AST 20  ALT 33  ALKPHOS 95  BILITOT 1.2  PROT 6.0*  ALBUMIN 3.4*   No results for input(s): LIPASE, AMYLASE in the last 168 hours. No results for  input(s): AMMONIA in the last 168 hours.  ABG    Component Value Date/Time   TCO2 26 06/01/2008 1857     Coagulation Profile: No results for input(s): INR, PROTIME in the last 168 hours.  Cardiac Enzymes: No results for input(s): CKTOTAL, CKMB, CKMBINDEX, TROPONINI in the last 168 hours.  HbA1C: Hemoglobin A1C  Date/Time Value Ref Range Status  12/31/2016 03:35 PM 5.2  Final   Hgb A1c MFr Bld  Date/Time Value Ref Range Status  06/21/2019 08:20 AM 6.3 (H) 4.8 - 5.6 % Final    Comment:    (NOTE) Pre diabetes:          5.7%-6.4% Diabetes:              >6.4% Glycemic control for   <7.0% adults with diabetes     CBG: No results for input(s): GLUCAP in the last 168 hours.  Review of Systems:   Review of Systems  Constitutional: Positive for malaise/fatigue.  HENT: Negative.   Eyes: Negative.   Respiratory: Negative.   Cardiovascular: Positive for leg swelling.  Gastrointestinal: Negative.   Genitourinary: Negative.   Musculoskeletal: Negative.   Skin: Positive for rash.  Neurological: Positive for speech change and weakness.  Endo/Heme/Allergies: Negative.   Psychiatric/Behavioral: Negative.      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   Surgical History    Past Surgical History:  Procedure Laterality Date  . APPENDECTOMY    . CATARACT EXTRACTION, BILATERAL    . CESAREAN SECTION     x2   . COLONOSCOPY  2007   neg with Dr. [name]  . ELBOW SURGERY    . HIP SURGERY  2009   left  . IR IMAGING GUIDED PORT  INSERTION  03/07/2020  . IR REMOVAL TUN CV CATH W/O FL  02/15/2020  . TUBAL LIGATION       Social History   reports that she has never smoked. She has never used smokeless tobacco. She reports current alcohol use of about 7.0 standard drinks of alcohol per week. She reports that she does not use drugs.   Family History   Her family history includes Cancer in her brother; Glaucoma in her father and mother; Heart disease in her father and mother; Heart failure in her father; Peripheral vascular disease in her mother; Prostate cancer in her brother; Raynaud syndrome in her daughter, daughter, and mother. There is no history of Breast cancer or Colon cancer.   Allergies Allergies  Allergen Reactions  . Zithromax [Azithromycin] Rash    Full body rash  . Zosyn [Piperacillin Sod-Tazobactam So] Swelling and Rash    Itching, rash and swelling of extremities Tolerates Rocephin  . Quinolones Rash    Cipro, Levaquin implicated Cipro also caused vomiting and abd pain  Home Medications  Prior to Admission medications   Medication Sig Start Date End Date Taking? Authorizing Provider  acetaminophen (TYLENOL) 500 MG tablet Take 500 mg by mouth every 6 (six) hours as needed for moderate pain or fever.   Yes [provider]  acyclovir (ZOVIRAX) 400 MG tablet Take 1 tablet (400 mg total) by mouth 2 (two) times daily. 02/22/19  Yes [name], MD  aspirin EC 81 MG tablet Take 1 tablet (81 mg total) by mouth daily. 04/01/18  Yes Dunn, Dayna N, PA-C  atorvastatin (LIPITOR) 20 MG tablet TAKE 1 TABLET BY MOUTH  DAILY Patient taking differently: Take 20 mg by mouth daily.  01/05/20  Yes [name], MD  calcium-vitamin D (OSCAL WITH D) 500-200 MG-UNIT TABS tablet Take by mouth.   Yes [provider]  cyclophosphamide (CYTOXAN) 50 MG capsule Take 6 capsules (331m) by mouth once weekly on day of chemo for 2 weeks, then off for one week 02/17/20  Yes FTruitt Merle MD  doxycycline (VIBRA-TABS)  100 MG tablet Take 100 mg by mouth as needed (treatment for pneumonia).    Yes [provider]  furosemide (LASIX) 20 MG tablet Take 1 tablet (20 mg total) by mouth daily. For 5 days then as needed 02/11/20  Yes FTruitt Merle MD  metoprolol succinate (TOPROL-XL) 25 MG 24 hr tablet TAKE 1 TABLET BY MOUTH  DAILY 11/08/19  Yes NDorothy Spark MD  morphine (MSIR) 15 MG tablet Take 0.5 tablets (7.5 mg total) by mouth every 8 (eight) hours as needed for severe pain. 02/04/20  Yes FTruitt Merle MD  ondansetron (ZOFRAN) 8 MG tablet Take 1 tablet (8 mg total) by mouth every 8 (eight) hours as needed for nausea or vomiting. 06/02/19  Yes FTruitt Merle MD  prochlorperazine (COMPAZINE) 10 MG tablet Take 1 tablet (10 mg total) by mouth every 6 (six) hours as needed (Nausea or vomiting). 06/14/19  Yes FTruitt Merle MD  triamcinolone cream (KENALOG) 0.1 % Apply 0.1 application topically 2 (two) times daily as needed (rash).  02/02/20 02/01/21 Yes [provider]     Critical care time:  32 minutes    PErick ColaceACNP-BC LPort St. LuciePager # [phone] 1907OR # [phone] no answer

## 2020-03-16 NOTE — Consult Note (Addendum)
Cardiology Consultation:   Patient ID: Shannon Rush MRN: [id number]; DOB: 1940-09-13  Admit date: 03/15/2020 Date of Consult: 03/16/2020  Primary Care Provider: [name], MD [facility] Cardiologist: Ena Dawley, MD  [place] Electrophysiologist:  None    Patient Profile:   Shannon Rush is a 79 y.o. female with a hx of multiple myeloma with hx of bone marrow transplant followed by [facility] and by Dr. Burr Medico here, CAD with inf STEMI w/p thrombolytic 02/2014, combined CHF possible chemo induced  who is being seen today for the evaluation of hypotension at the request of Dr. Bonner Puna.  History of Present Illness:   Ms. Skilton with above hx. And most recent echo 11/08/19 with EF 55-60% G1DD.  On prior cardiac cath 2015 with pRCA 60% [place] mid to dist RCA 50% stenosis and medical therapy.  Hx cardiomyopathy with EF down to 25-30% in 06/2016.  This has improved and felt due to chemo for Multiple myeloma.  Pt has been very active, she was swimming and strengthening exercises.    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  For multiple myeloma She completed another cycle on kyprolis/cytoxan/dex on 02/17/20. Chemo was increased to 2 weeks on/1 week off starting today due to increased M spike.   Pt now admitted 03/15/20 after presenting with Rt lower leg pain with weeping wound unrelieved by morphine.  She had been evaluated at [facility] with LLE cellulitis then developed rt leg cellulitis.  At that time her echo 01/31/20 with EF 50% LA mildly enlarged, RV mildly enlarged and mild TR.   CT of chest at that time with rt and lt trace pl effusions. BNP was 11,000. She did receive IV lasix with that admit.   Pt is hypotensive 99I to 80 systolic, was 338/25 on arrival Fluids given but with elevated BNP and CXR with either PNA or failure but with  cardiomegaly favors HF.   EKG:  The EKG was personally reviewed and demonstrates:  Begins with SVT at 150 possible flutter then to SR , T wave inversion inf are old but more pronounced and lateral leads T wave inversion more pronounced.  Telemetry:  Telemetry was personally reviewed and demonstrates:  No tele  Venous doppler neg DVT  Echo today limited EF 35-40% + RWMA, LV mildly dilated.  Marked RV dysfunction.  Right ventricular systolic function is  severely reduced. The right ventricular size is moderately enlarged. There  is moderately elevated pulmonary artery systolic pressure ( this was low normal on last echo 11/08/19).  Mod TR.   Na 138 K+ 3.8, C02 19  BNP 2409 lactic acid 1.7 WBC 2.3 hgb 8.1 plts 102 was 55 8/26 and 131 yesterday  BP now 83/47 P 78 afebrile on admit Pt sitting up in bed, no chest pain except with movement at portocath site.   No chest pain prior to admit.  No complaints of SOB    Past Medical History:  Diagnosis Date  . CAD (coronary artery disease)    a. inf STEMI (in [place] >>> [facility]ytics) >>> LHC (8/15- [place]):  pRCA 60%, mid to dist RCA 50% >>> med rx  . Cardiomyopathy ([place])   . Cataract   . Chronic combined systolic and diastolic CHF (congestive heart failure) ([place])    cancer medication related   . Cord compression (  [place]) 07/13/12   MRI- diffuse myeloma involvement of T-L spine  . History of radiation therapy 07/13/12-07/27/12   spinal cord compression T3-T10,left scapula  . Infection 01/29/2020   left leg infection- treated at [facility]  . 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 Multiple myeloma ([place]) 07/01/2012  . MVP (mitral valve prolapse)    a. trivial by echo 06/2017.  . OSTEOPOROSIS 06/11/2010   Multiple compression fractures; and spontaneous fracture of sternum Qualifier: Diagnosis of  By: [name], [name]     . Thoracic kyphosis 07/13/12   per MRI scan  . Unspecified deficiency anemia     Past Surgical History:  Procedure  Laterality Date  . APPENDECTOMY    . CATARACT EXTRACTION, BILATERAL    . CESAREAN SECTION     x2   . COLONOSCOPY  2007   neg with Dr. [name]  . ELBOW SURGERY    . HIP SURGERY  2009   left  . IR IMAGING GUIDED PORT INSERTION  03/07/2020  . IR REMOVAL TUN CV CATH W/O FL  02/15/2020  . TUBAL LIGATION       Home Medications:  Prior to Admission medications   Medication Sig Start Date End Date Taking? Authorizing Provider  acetaminophen (TYLENOL) 500 MG tablet Take 500 mg by mouth every 6 (six) hours as needed for moderate pain or fever.   Yes [provider]  acyclovir (ZOVIRAX) 400 MG tablet Take 1 tablet (400 mg total) by mouth 2 (two) times daily. 02/22/19  Yes [name], MD  aspirin EC 81 MG tablet Take 1 tablet (81 mg total) by mouth daily. 04/01/18  Yes Dunn, Dayna N, PA-C  atorvastatin (LIPITOR) 20 MG tablet TAKE 1 TABLET BY MOUTH  DAILY Patient taking differently: Take 20 mg by mouth daily.  01/05/20  Yes [name], MD  calcium-vitamin D (OSCAL WITH D) 500-200 MG-UNIT TABS tablet Take by mouth.   Yes [provider]  cyclophosphamide (CYTOXAN) 50 MG capsule Take 6 capsules (391m) by mouth once weekly on day of chemo for 2 weeks, then off for one week 02/17/20  Yes FTruitt Merle MD  doxycycline (VIBRA-TABS) 100 MG tablet Take 100 mg by mouth as needed (treatment for pneumonia).    Yes [provider]  furosemide (LASIX) 20 MG tablet Take 1 tablet (20 mg total) by mouth daily. For 5 days then as needed 02/11/20  Yes FTruitt Merle MD  metoprolol succinate (TOPROL-XL) 25 MG 24 hr tablet TAKE 1 TABLET BY MOUTH  DAILY 11/08/19  Yes NDorothy Spark MD  morphine (MSIR) 15 MG tablet Take 0.5 tablets (7.5 mg total) by mouth every 8 (eight) hours as needed for severe pain. 02/04/20  Yes FTruitt Merle MD  ondansetron (ZOFRAN) 8 MG tablet Take 1 tablet (8 mg total) by mouth every 8 (eight) hours as needed for nausea or vomiting. 06/02/19  Yes FTruitt Merle MD  prochlorperazine  (COMPAZINE) 10 MG tablet Take 1 tablet (10 mg total) by mouth every 6 (six) hours as needed (Nausea or vomiting). 06/14/19  Yes FTruitt Merle MD  triamcinolone cream (KENALOG) 0.1 % Apply 0.1 application topically 2 (two) times daily as needed (rash).  02/02/20 02/01/21 Yes [provider]    Inpatient Medications: Scheduled Meds: . aspirin EC  81 mg Oral Daily  . atorvastatin  20 mg Oral Daily  . calcium-vitamin D  1 tablet Oral Q breakfast  . enoxaparin (LOVENOX) injection  40 mg Subcutaneous Q24H  . furosemide  20 mg Oral Daily  . metoprolol succinate  25 mg Oral Daily   Continuous Infusions: . acyclovir 400 mg (03/16/20 0010)  . ceFEPime (MAXIPIME) IV 2 g (03/16/20 0829)   PRN Meds: acetaminophen, metoprolol tartrate, ondansetron, prochlorperazine  Allergies:    Allergies  Allergen Reactions  . Zithromax [Azithromycin] Rash    Full body rash  . Zosyn [Piperacillin Sod-Tazobactam So] Swelling and Rash    Itching, rash and swelling of extremities Tolerates Rocephin  . Quinolones Rash    Cipro, Levaquin implicated Cipro also caused vomiting and abd pain    Social History:   Social History   Socioeconomic History  . Marital status: Married    Spouse name: Fritz Pickerel  . Number of children: 2  . Years of education: Not on file  . Highest education level: Doctorate  Occupational History  . Occupation: Museum/gallery conservator: [employer]  Tobacco Use  . Smoking status: Never Smoker  . Smokeless tobacco: Never Used  Vaping Use  . Vaping Use: Never used  Substance and Sexual Activity  . Alcohol use: Yes    Alcohol/week: 7.0 standard drinks    Types: 7 Standard drinks or equivalent per week    Comment: 1 glass of wine or beer 3-5 days per week  . Drug use: No  . Sexual activity: Yes    Birth control/protection: Post-menopausal  Other Topics Concern  . Not on file  Social History Narrative   Who lives with you: spouse, 2 story home, does not  have problems with the stairs, has walk in shower, handrails.   Any pets: Dog, Brayden, black lab/boxer mix   Diet: Patient has a varied diet of protein, vegetables, starches.  Limits red meats.   Exercise: Patient exercises at least 3 times a week for over an hour.   Seatbelts: Patient reports wearing her seatbelt when in vehicle.    Nancy Fetter Exposure/Protection: Patient reports wearing sunscreen daily.   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 Biked 5 miles this morning to this appointment.   Social Determinants of Health   Financial Resource Strain:   . Difficulty of Paying Living Expenses: Not on file  Food Insecurity:   . Worried About Charity fundraiser in the Last Year: Not on file  . Ran Out of Food in the Last Year: Not on file  Transportation Needs:   . Lack of Transportation (Medical): Not on file  .  Lack of Transportation (Non-Medical): Not on file  Physical Activity:   . Days of Exercise per Week: Not on file  . Minutes of Exercise per Session: Not on file  Stress:   . Feeling of Stress : Not on file  Social Connections:   . Frequency of Communication with Friends and Family: Not on file  . Frequency of Social Gatherings with Friends and Family: Not on file  . Attends Religious Services: Not on file  . Active Member of Clubs or Organizations: Not on file  . Attends Archivist Meetings: Not on file  .  Marital Status: Not on file  Intimate Partner Violence:   . Fear of Current or Ex-Partner: Not on file  . Emotionally Abused: Not on file  . Physically Abused: Not on file  . Sexually Abused: Not on file    Family History:    Family History  Problem Relation Age of Onset  . Glaucoma Mother   . Heart disease Mother   . Peripheral vascular disease Mother   . Raynaud syndrome Mother   . Heart disease Father   . Heart failure Father   . Glaucoma Father   . Cancer Brother        stage IV prostate CA  . Prostate cancer Brother   . Raynaud syndrome Daughter   . Raynaud syndrome Daughter   . Breast cancer Neg Hx   . Colon cancer Neg Hx      ROS:  Please see the history of present illness.  General:no colds or fevers, no weight changes since June Skin:no rashes or ulcers HEENT:no blurred vision, no congestion CV:see HPI PUL:see HPI GI:no diarrhea constipation or melena, no indigestion that she is aware of  GU:no hematuria, no dysuria MS:no joint pain, no claudication Neuro:no syncope, no lightheadedness Endo:no diabetes, no thyroid disease  All other ROS reviewed and negative.     Physical Exam/Data:   Vitals:   03/16/20 0600 03/16/20 0615 03/16/20 0630 03/16/20 0728  BP: (!) 88/55 (!) 83/53 (!) 85/53 (!) 83/47  Pulse: 79 79 82 78  Resp: 20 (!) _0 Temp:      TempSrc:      SpO2: 93% 90% 94% 93%  Weight:      Height:        Intake/Output Summary (Last 24 hours) at 03/16/2020 0919 Last data filed at 03/15/2020 1707 Gross per 24 hour  Intake 1330 ml  Output --  Net 1330 ml   Last 3 Weights 03/15/2020 03/07/2020 03/02/2020  Weight (lbs) 114 lb 6.7 oz 114 lb 6.4 oz 114 lb 6.4 oz  Weight (kg) 51.9 kg 51.89 kg 51.891 kg     Body mass index is 18.47 kg/m.  General:  Frail female, in no acute distress, sitting up in bed. Husband in room as well. HEENT: normal Lymph: no adenopathy Neck: + JVD Endocrine:  No thryomegaly Vascular: No carotid bruits; pedal pulses 2+  bilaterally   Cardiac:  normal S1, S2; RRR; no murmur gallup rub or click Lungs:  Fine rales in bases to auscultation bilaterally, no wheezing, rhonchi   Abd: soft, nontender, no hepatomegaly  Ext: no edema Musculoskeletal:  No deformities, BUE and BLE strength normal and equal Skin: warm and dry  Neuro:  Alert and oriented but does make unusual remarks. , no focal abnormalities noted Psych:  Normal affect    Relevant CV Studies: ECHO 03/15/20 IMPRESSIONS    1. Abnormal septal motion/flattening inferior wall hypokinesis .  Left  ventricular ejection fraction, by estimation, is 35 to 40%. The left  ventricle has moderately decreased function. The left ventricle  demonstrates regional wall motion abnormalities  (see scoring diagram/findings for description). The left ventricular  internal cavity size was mildly dilated. Left ventricular diastolic  parameters are indeterminate.  2. Marked RV dysfunction . Right ventricular systolic function is  severely reduced. The right ventricular size is moderately enlarged. There  is moderately elevated pulmonary artery systolic pressure.  3. Left atrial size was moderately dilated.  4. Right atrial size was moderately dilated.  5. Mild mitral valve regurgitation.  6. Tricuspid valve regurgitation is moderate.  7. The aortic valve is tricuspid. Aortic valve regurgitation is trivial.  Mild to moderate aortic valve sclerosis/calcification is present, without  any evidence of aortic stenosis.  8. The inferior vena cava is dilated in size with <50% respiratory  variability, suggesting right atrial pressure of 15 mmHg.   FINDINGS  Left Ventricle: Abnormal septal motion/flattening inferior wall  hypokinesis. Left ventricular ejection fraction, by estimation, is 35 to  40%. The left ventricle has moderately decreased function. The left  ventricle demonstrates regional wall motion  abnormalities. The left ventricular internal cavity size was  mildly  dilated.   Right Ventricle: Marked RV dysfunction. The right ventricular size is  moderately enlarged. Right vetricular wall thickness was not assessed.  Right ventricular systolic function is severely reduced. There is  moderately elevated pulmonary artery systolic  pressure. The tricuspid regurgitant velocity is 2.78 m/s, and with an  assumed right atrial pressure of 15 mmHg, the estimated right ventricular  systolic pressure is 62.2 mmHg.   Left Atrium: Left atrial size was moderately dilated.   Right Atrium: Right atrial size was moderately dilated.   Pericardium: There is no evidence of pericardial effusion.   Mitral Valve: There is mild thickening of the mitral valve leaflet(s).  There is mild calcification of the mitral valve leaflet(s). Mild mitral  valve regurgitation.   Tricuspid Valve: Tricuspid valve regurgitation is moderate.   Aortic Valve: The aortic valve is tricuspid. Aortic valve regurgitation is  trivial. Mild to moderate aortic valve sclerosis/calcification is present,  without any evidence of aortic stenosis.   Pulmonic Valve: The pulmonic valve was not well visualized.   Venous: The inferior vena cava is dilated in size with less than 50%  respiratory variability, suggesting right atrial pressure of 15 mmHg.   LEFT VENTRICLE  PLAX 2D  LVOT diam:   1.90 cm  LV SV:     41  LV SV Index:  26  LVOT Area:   2.84 cm    LV Volumes (MOD)  LV vol d, MOD A2C: 115.0 ml  LV vol d, MOD A4C: 94.5 ml  LV vol s, MOD A2C: 69.0 ml  LV vol s, MOD A4C: 63.3 ml  LV SV MOD A2C:   46.0 ml  LV SV MOD A4C:   94.5 ml  LV SV MOD BP:   35.1 ml   RIGHT VENTRICLE  RV S prime:   5.44 cm/s  TAPSE (M-mode): 1.0 cm   AORTIC VALVE  LVOT Vmax:  75.10 cm/s  LVOT Vmean: 53.400 cm/s  LVOT VTI:  0.146 m   ECHO 01/31/20 MILD LV DYSFUNCTION (See above)   NORMAL RIGHT VENTRICULAR SYSTOLIC FUNCTION   VALVULAR REGURGITATION: TRIVIAL AR, MILD MR,  MILD PR, MILD TR   NO VALVULAR STENOSIS   3D acquisition and reconstructions were performed as part of this   examination  to more accurately quantify the effects of Pre/Post Chemo,   Radiation or other therapy. (post-processing on an Independent   workstation).    Compared with prior Echo study on 12/10/2012: MILDLY REDUCED LV FUNCTION.   REDUCED GLOBAL LONGITUDINAL STRAIN.    Echo 11/08/19 IMPRESSIONS    1. Left ventricular ejection fraction, by estimation, is 55 to 60%. The  left ventricle has normal function. The left ventricle demonstrates  regional wall motion abnormalities (see scoring diagram/findings for  description). Left ventricular diastolic  parameters are consistent with Grade I diastolic dysfunction (impaired  relaxation).  2. Right ventricular systolic function is low normal. The right  ventricular size is normal. There is normal pulmonary artery systolic  pressure.  3. Left atrial size was mildly dilated.  4. Right atrial size was mildly dilated.  5. The mitral valve is abnormal. Mild mitral valve regurgitation.  6. AV is thickened, The L coronary cusp has echo bright region consistent  with possible Lambl's escrescence vs Nodule of Arentius vs fibroelstoma.  Consider TEE to further evaluate. Finding is a little more prominent than  on previous echo.. The aortic  valve is tricuspid. Aortic valve regurgitation is not visualized. Mild to  moderate aortic valve sclerosis/calcification is present, without any  evidence of aortic stenosis.  7. The inferior vena cava is dilated in size with <50% respiratory  variability, suggesting right atrial pressure of 15 mmHg.   FINDINGS  Left Ventricle: Left ventricular ejection fraction, by estimation, is 55  to 60%. The left ventricle has normal function. The left ventricle  demonstrates regional wall motion abnormalities. Mild hypokinesis of the  left ventricular, basal-mid inferior  wall. Mild hypokinesis  of the left ventricular, basal inferoseptal wall.  The left ventricular internal cavity size was normal in size. There is no  left ventricular hypertrophy. Left ventricular diastolic parameters are  consistent with Grade I diastolic  dysfunction (impaired relaxation).   Right Ventricle: The right ventricular size is normal. Right vetricular  wall thickness was not assessed. Right ventricular systolic function is  low normal. There is normal pulmonary artery systolic pressure. The  tricuspid regurgitant velocity is 2.00  m/s, and with an assumed right atrial pressure of 10 mmHg, the estimated  right ventricular systolic pressure is 58.8 mmHg.   Left Atrium: Left atrial size was mildly dilated.   Right Atrium: Right atrial size was mildly dilated.   Pericardium: There is no evidence of pericardial effusion.   Mitral Valve: The mitral valve is abnormal. There is mild thickening of  the mitral valve leaflet(s). Mild mitral valve regurgitation.   Tricuspid Valve: The tricuspid valve is normal in structure. Tricuspid  valve regurgitation is mild.   Aortic Valve: AV is thickened, The L coronary cusp has echo bright region  consistent with possible Lambl's escrescence vs Nodule of Arentius vs  fibroelstoma. Consider TEE to further evaluate. Finding is a little more  prominent than on previous echo. The  aortic valve is tricuspid. Aortic valve regurgitation is not visualized.  Mild to moderate aortic valve sclerosis/calcification is present, without  any evidence of aortic stenosis.   Pulmonic Valve: The pulmonic valve was thickened with good excursion.  Pulmonic valve regurgitation is mild to moderate.  Laboratory Data:  High Sensitivity Troponin:  No results for input(s): TROPONINIHS in the last 720 hours.   Chemistry Recent Labs  Lab 03/09/20 1305 03/15/20 1021  NA 138 138  K 4.1 3.8  CL 109 108  CO2 24 19*  GLUCOSE 128*  121*  BUN 24* 23  CREATININE 0.85 0.80  CALCIUM 9.5  9.1  GFRNONAA >60 >60  GFRAA >60 >60  ANIONGAP 5 11    Recent Labs  Lab 03/09/20 1305  PROT 6.0*  ALBUMIN 3.4*  AST 20  ALT 33  ALKPHOS 95  BILITOT 1.2   Hematology Recent Labs  Lab 03/09/20 1305 03/15/20 1021 03/16/20 0551  WBC 3.3* 7.4 2.3*  RBC 2.08* 2.53* 2.29*  HGB 7.3* 8.8* 8.1*  HCT 22.6* 26.9* 24.1*  MCV 108.7* 106.3* 105.2*  MCH 35.1* 34.8* 35.4*  MCHC 32.3 32.7 33.6  RDW 18.6* 20.1* 20.5*  PLT 55* 131* 102*   BNP Recent Labs  Lab 03/15/20 1021  BNP 2,409.6*    DDimer No results for input(s): DDIMER in the last 168 hours.   Radiology/Studies:  DG Chest 2 View  Result Date: 03/15/2020 CLINICAL DATA:  Weakness and right leg pain. EXAM: CHEST - 2 VIEW COMPARISON:  08/18/2018 FINDINGS: Patchy bilateral airspace disease. Cardiomegaly and vascular pedicle widening. There is a small right pleural effusion. No pneumothorax. Porta catheter with tip at the right atrium. Remote spinal, rib, and left clavicle fracture deformities. There is history of multiple myeloma with diffuse demineralization. IMPRESSION: Bilateral airspace disease that could be infection or failure. Small right pleural effusion interval cardiomegaly favors failure. Electronically Signed   By: Monte Fantasia M.D.   On: 03/15/2020 11:05   DG Tibia/Fibula Right  Result Date: 03/15/2020 CLINICAL DATA:  Right leg pain with multiple wounds. Multiple myeloma. EXAM: RIGHT TIBIA AND FIBULA - 2 VIEW COMPARISON:  None. FINDINGS: Nonspecific subcutaneous reticulation. No evidence of fracture. There is generalized osteopenia without visible myeloma in the covered skeleton. IMPRESSION: Generalized soft tissue swelling without acute or malignant osseous finding. Electronically Signed   By: Monte Fantasia M.D.   On: 03/15/2020 11:06   VAS [place] LOWER EXTREMITY VENOUS (DVT) (ONLY [facility] & [facility])  Result Date: 03/15/2020  Lower Venous DVTStudy Indications: Edema.  Limitations: Body habitus, poor ultrasound/tissue interface  and patient position. Comparison Study: No prior study Performing Technologist: Maudry Mayhew MHA, RDMS, RVT, RDCS  Examination Guidelines: A complete evaluation includes B-mode imaging, spectral Doppler, color Doppler, and power Doppler as needed of all accessible portions of each vessel. Bilateral testing is considered an integral part of a complete examination. Limited examinations for reoccurring indications may be performed as noted. The reflux portion of the exam is performed with the patient in reverse Trendelenburg.  +---------+---------------+---------+-----------+----------+--------------+ RIGHT    CompressibilityPhasicitySpontaneityPropertiesThrombus Aging +---------+---------------+---------+-----------+----------+--------------+ CFV      Full           No       Yes                                 +---------+---------------+---------+-----------+----------+--------------+ SFJ      Full                                                        +---------+---------------+---------+-----------+----------+--------------+ FV Prox  Full                                                        +---------+---------------+---------+-----------+----------+--------------+  FV Mid   Full                                                        +---------+---------------+---------+-----------+----------+--------------+ FV DistalFull                                                        +---------+---------------+---------+-----------+----------+--------------+ PFV      Full                                                        +---------+---------------+---------+-----------+----------+--------------+ POP      Full           No       Yes                                 +---------+---------------+---------+-----------+----------+--------------+ PTV      Full                                                         +---------+---------------+---------+-----------+----------+--------------+ PERO     Full                                                        +---------+---------------+---------+-----------+----------+--------------+   +---------+---------------+---------+-----------+----------+--------------+ LEFT     CompressibilityPhasicitySpontaneityPropertiesThrombus Aging +---------+---------------+---------+-----------+----------+--------------+ CFV      Full           No       Yes                                 +---------+---------------+---------+-----------+----------+--------------+ SFJ      Full                                                        +---------+---------------+---------+-----------+----------+--------------+ FV Prox  Full                                                        +---------+---------------+---------+-----------+----------+--------------+ FV Mid   Full                                                        +---------+---------------+---------+-----------+----------+--------------+  FV DistalFull                                                        +---------+---------------+---------+-----------+----------+--------------+ PFV      Full                                                        +---------+---------------+---------+-----------+----------+--------------+ POP      Full           No       Yes                                 +---------+---------------+---------+-----------+----------+--------------+ PTV      Full                                                        +---------+---------------+---------+-----------+----------+--------------+ PERO     Full                                                        +---------+---------------+---------+-----------+----------+--------------+     Summary: RIGHT: - There is no evidence of deep vein thrombosis in the lower extremity.  - No cystic structure found in  the popliteal fossa.  LEFT: - There is no evidence of deep vein thrombosis in the lower extremity.  - A cystic structure is found in the popliteal fossa.  Bilateral lower extremity veins exhibit pulsatile flow, suggestive of possibly elevated right heart pressure.  *See table(s) above for measurements and observations. Electronically signed by Curt Jews MD on 03/15/2020 at 3:12:07 PM.    Final    ECHOCARDIOGRAM LIMITED  Result Date: 03/16/2020    ECHOCARDIOGRAM LIMITED REPORT   Patient Name:   Shannon Rush Date of Exam: 03/16/2020 Medical Rec #:  [id number]      Height:       66.0 in Accession #:    [id number]     Weight:       114.4 lb Date of Birth:  May 03, 1941     BSA:          1.577 m Patient Age:    43 years       BP:           74/50 mmHg Patient Gender: F              HR:           78 bpm. Exam Location:  Inpatient Procedure: Limited Echo, Color Doppler and Cardiac Doppler Indications:    L57.26 Acute diastolic (congestive) heart failure  History:        Patient has prior history of Echocardiogram examinations, most                 recent 11/08/2019. CHF; Risk Factors:Hypertension.  Sonographer:    Raquel Sarna Senior RDCS Referring Phys: [id number] Harold Hedge  Sonographer Comments: Technically difficult due to small rib spacing. IMPRESSIONS  1. Abnormal septal motion/flattening inferior wall hypokinesis . Left ventricular ejection fraction, by estimation, is 35 to 40%. The left ventricle has moderately decreased function. The left ventricle demonstrates regional wall motion abnormalities (see scoring diagram/findings for description). The left ventricular internal cavity size was mildly dilated. Left ventricular diastolic parameters are indeterminate.  2. Marked RV dysfunction . Right ventricular systolic function is severely reduced. The right ventricular size is moderately enlarged. There is moderately elevated pulmonary artery systolic pressure.  3. Left atrial size was moderately dilated.  4. Right atrial size  was moderately dilated.  5. Mild mitral valve regurgitation.  6. Tricuspid valve regurgitation is moderate.  7. The aortic valve is tricuspid. Aortic valve regurgitation is trivial. Mild to moderate aortic valve sclerosis/calcification is present, without any evidence of aortic stenosis.  8. The inferior vena cava is dilated in size with <50% respiratory variability, suggesting right atrial pressure of 15 mmHg. FINDINGS  Left Ventricle: Abnormal septal motion/flattening inferior wall hypokinesis. Left ventricular ejection fraction, by estimation, is 35 to 40%. The left ventricle has moderately decreased function. The left ventricle demonstrates regional wall motion abnormalities. The left ventricular internal cavity size was mildly dilated. Right Ventricle: Marked RV dysfunction. The right ventricular size is moderately enlarged. Right vetricular wall thickness was not assessed. Right ventricular systolic function is severely reduced. There is moderately elevated pulmonary artery systolic pressure. The tricuspid regurgitant velocity is 2.78 m/s, and with an assumed right atrial pressure of 15 mmHg, the estimated right ventricular systolic pressure is 26.8 mmHg. Left Atrium: Left atrial size was moderately dilated. Right Atrium: Right atrial size was moderately dilated. Pericardium: There is no evidence of pericardial effusion. Mitral Valve: There is mild thickening of the mitral valve leaflet(s). There is mild calcification of the mitral valve leaflet(s). Mild mitral valve regurgitation. Tricuspid Valve: Tricuspid valve regurgitation is moderate. Aortic Valve: The aortic valve is tricuspid. Aortic valve regurgitation is trivial. Mild to moderate aortic valve sclerosis/calcification is present, without any evidence of aortic stenosis. Pulmonic Valve: The pulmonic valve was not well visualized. Venous: The inferior vena cava is dilated in size with less than 50% respiratory variability, suggesting right atrial  pressure of 15 mmHg. LEFT VENTRICLE PLAX 2D LVOT diam:     1.90 cm LV SV:         41 LV SV Index:   26 LVOT Area:     2.84 cm  LV Volumes (MOD) LV vol d, MOD A2C: 115.0 ml LV vol d, MOD A4C: 94.5 ml LV vol s, MOD A2C: 69.0 ml LV vol s, MOD A4C: 63.3 ml LV SV MOD A2C:     46.0 ml LV SV MOD A4C:     94.5 ml LV SV MOD BP:      35.1 ml RIGHT VENTRICLE RV S prime:     5.44 cm/s TAPSE (M-mode): 1.0 cm AORTIC VALVE LVOT Vmax:   75.10 cm/s LVOT Vmean:  53.400 cm/s LVOT VTI:    0.146 m TRICUSPID VALVE TR Peak grad:   30.9 mmHg TR Vmax:        278.00 cm/s  SHUNTS Systemic VTI:  0.15 m Systemic Diam: 1.90 cm Jenkins Rouge MD Electronically signed by Jenkins Rouge MD Signature Date/Time: 03/16/2020/8:46:18 AM    Final         No chest pain New York Heart Association (NYHA) Functional  Class NYHA Class II  Assessment and Plan:   1. CHF, combined systolic and diastolic chronic now with decreased in EF, has had previously with chemo down to 25-35%.  Now  RV dysfunction new from 4/2021though mild decrease in July when at [facility].  She has some HF on CXR and elevated BNP (was 11,000 at [facility])  though with hypotension today, concern for sepsis in pt receiving chemo.  Currently no acute SOB. She is on lasix 20 mg daily at home. And toprol XL 25 mg though she did receive one dose of 25 mg of IV lopressor yesterday.   May need pressors to treat BP and HF.  Pt is a DNR but family agreed to pressors  2. RHF see above. 3. PSVT on beginning of EKG.  Then to SR.  No chest pain.   4. hypotension since admit, rec'd 1 L NS and albumin twice  5. CAD with last cath 2015 with [place] STEMI[place] state and rec'd thrombolytics. Cath with non obstructive CAD of RCA, other vessels patent  6. Cellulitis of Rt lower leg and recent cellulitis of LLL neg DVT per IM and ID on IV ABX. 7. Multiple myeloma on chemo with leukopenia and thrombocytopenia per oncology   8. HLD on statin     Review of [place] Epic system along with [facility] hospitalization  in July done.  For questions or updates, please contact [place] Please consult [email or web address] for contact info under    Signed, Cecilie Kicks, NP  03/16/2020 9:19 AM   History and all data above reviewed.  Patient examined.   Patient is very pleasant patient with a PhD in psychology.  He is very knowledgeable.  She has a previous history of cardiomyopathy thought to be related to chemotherapy but I do note as above that her most recent echo at [facility] demonstrated well-preserved left ventricular function.  There is specific mention that her RV function was normal though slightly enlarged.  She presents really with leg pain.  She is found to be hypotensive.  She has a newly reduced ejection fraction compared to the recent EF at [facility].  Her left ventricular function is about 35%.  She also has a new right ventricular dysfunction.  She reports that she has been getting around the house although she has had some decreased strength and speech recently.  Predominantly she has had pain related to cellulitis.  She is not really complaining of chest discomfort.  She has not been having any new shortness of breath, PND or orthopnea.  She was noted to have some PSVT here but has not really been having any palpitations.  Has not had any presyncope or syncope.  I agree with the findings as above.  The patient exam reveals COR:RRR  ,  Lungs: Decreased breath sounds with bilateral crackles  ,  Abd: Positive bowel sounds, no rebound no guarding, Ext Right greater than left leg swelling with tenderness of the right greater than left anterior tibial and calf regions  .  All available labs, radiology testing, previous records reviewed. Agree with documented assessment and plan.   Acute systolic HF:  Newly reduced EF would could be explained by acute response to cellulitis/sepsis and would need continued support and treatment of underlying infection.  No indication for inotropic agents or mechanical support at this time.  RV  failure:  This is new and concerning and likely resulting on CO that will be lower with reduced preload.  She will likely need  to be kept positive.  Because of the acute RV failure I will be ordering a CT to rule out pulmonary embolism.    Jeneen Rinks Hochrein  12:10 PM  03/16/2020

## 2020-03-16 NOTE — Progress Notes (Signed)
PHARMACY - PHYSICIAN COMMUNICATION CRITICAL VALUE ALERT - BLOOD CULTURE IDENTIFICATION (BCID)  Shannon Rush is an 79 y.o. female who presented to [facility] on 03/15/2020 with a chief complaint of worsening cellulitis.   Assessment:  BCID+ pseudomonas, no carbapenem resistance detected.  Patient afebrile, hypotensive.  Name of physician (or Provider) Contacted: Ardith Dark  Current antibiotics: Meropenem + Vancomycin  Changes to prescribed antibiotics recommended:  D/C Vancomycin Consider de-escalate Meropenem to Cefepime 2gm IV q12h.    Results for orders placed or performed during the hospital encounter of 03/15/20  Blood Culture ID Panel (Reflexed) (Collected: 03/15/2020 11:10 AM)  Result Value Ref Range   Enterococcus faecalis NOT DETECTED NOT DETECTED   Enterococcus Faecium NOT DETECTED NOT DETECTED   Listeria monocytogenes NOT DETECTED NOT DETECTED   Staphylococcus species NOT DETECTED NOT DETECTED   Staphylococcus aureus (BCID) NOT DETECTED NOT DETECTED   Staphylococcus epidermidis NOT DETECTED NOT DETECTED   Staphylococcus lugdunensis NOT DETECTED NOT DETECTED   Streptococcus species NOT DETECTED NOT DETECTED   Streptococcus agalactiae NOT DETECTED NOT DETECTED   Streptococcus pneumoniae NOT DETECTED NOT DETECTED   Streptococcus pyogenes NOT DETECTED NOT DETECTED   A.calcoaceticus-baumannii NOT DETECTED NOT DETECTED   Bacteroides fragilis NOT DETECTED NOT DETECTED   Enterobacterales NOT DETECTED NOT DETECTED   Enterobacter cloacae complex NOT DETECTED NOT DETECTED   Escherichia coli NOT DETECTED NOT DETECTED   Klebsiella aerogenes NOT DETECTED NOT DETECTED   Klebsiella oxytoca NOT DETECTED NOT DETECTED   Klebsiella pneumoniae NOT DETECTED NOT DETECTED   Proteus species NOT DETECTED NOT DETECTED   Salmonella species NOT DETECTED NOT DETECTED   Serratia marcescens NOT DETECTED NOT DETECTED   Haemophilus influenzae NOT DETECTED NOT DETECTED   Neisseria meningitidis NOT  DETECTED NOT DETECTED   Pseudomonas aeruginosa DETECTED (A) NOT DETECTED   Stenotrophomonas maltophilia NOT DETECTED NOT DETECTED   Candida albicans NOT DETECTED NOT DETECTED   Candida auris NOT DETECTED NOT DETECTED   Candida glabrata NOT DETECTED NOT DETECTED   Candida krusei NOT DETECTED NOT DETECTED   Candida parapsilosis NOT DETECTED NOT DETECTED   Candida tropicalis NOT DETECTED NOT DETECTED   Cryptococcus neoformans/gattii NOT DETECTED NOT DETECTED   CTX-M ESBL NOT DETECTED NOT DETECTED   Carbapenem resistance IMP NOT DETECTED NOT DETECTED   Carbapenem resistance KPC NOT DETECTED NOT DETECTED   Carbapenem resistance NDM NOT DETECTED NOT DETECTED   Carbapenem resistance VIM NOT DETECTED NOT DETECTED    [name] PharmD, BCPS 03/16/2020  6:07 AM

## 2020-03-16 NOTE — Progress Notes (Signed)
BCID Pseudomonas, per Pharmacy recommendation, discontinued vancomycin and meropenem and started Cefapime.  She has a history of tolerating cephalosporins.

## 2020-03-16 NOTE — ED Notes (Signed)
Provider paged due to pt pressures maintaining at 70s/50s. Highest MAP achieved on last four B/Ps is 53. Pt A+O when spoken to. No response at this time.

## 2020-03-16 NOTE — Progress Notes (Addendum)
HEMATOLOGY-ONCOLOGY PROGRESS NOTE  SUBJECTIVE: The patient was seen in the emergency room today.  Her husband is at the bedside.  The patient came into the hospital due to worsening fatigue.  She had increased weeping from her right lower extremity.  The patient was quite somnolent when she was initially brought in.  Notable labs include a BNP of 2409, lactic acid of 1.7, hemoglobin 8.8.  A bilateral lower extremity ultrasound was negative for DVT.  Chest x-ray showed bilateral airspace disease reflecting infection or failure.  An echocardiogram performed earlier today showed a decreased ejection fraction of 35 to 40%.  Blood cultures drawn on admission positive for Pseudomonas in both the aerobic and anaerobic bottles.  The patient was seen by PCCM earlier today secondary to severe sepsis.  Currently does not need pressors.  Cardiology has also seen the patient and recommends a CT angiogram of the chest.  Factious disease has seen the patient recommends continuation of cefepime and to proceed with TTE.  When seen today, the patient is more alert.  Pressures are in the 90s over 50s.  Denies fevers and chills.  Denies chest pain, shortness of breath.  She notices a cough particularly after she eats or drinks.  This has been going on for some time but states that this is getting worse.  Denies abdominal pain, nausea, vomiting.  Has some tenderness in her right lower extremity and some weeping.  Oncology History Overview Note  Multiple myeloma [facility])   Staging form: Multiple Myeloma, AJCC 6th Edition   - Clinical: Stage IIIA - Signed by [name], MD on 09/04/2013    Multiple myeloma ([place])  05/26/2012 Initial Diagnosis   Presenting IgG was 4,040 mg/dL on 06/05/2012 (IgA 35; IgM 34); kappa free light chain 34.4 mg/dL, lambda 0.00, kappa:lambda ratio of 34.75; SPEP with M-spike of 2.60.   06/30/2012 Imaging   Numerous lytic lesions throughout the calvarium.  Lytic lesion within the left lateral scapula.   Findings most compatible with metastases or myeloma. Multiple mid and lower thoracic compression fractures.  Slight compression through the endplates at L3.   74/82/7078 Bone Marrow Biopsy   Bone marrow biopsy showed 49% plasma cell. Normal classical cytogenetics; however, myeloma FISH panel showed 13q- (intermediate risk)       07/14/2012 - 07/27/2012 Radiation Therapy   Palliative radiation 20 Gy over 10 fractions between to thoracic spine cord compression and symptomatic left scapula lytic lesion.   08/10/2012 - 11/2012 Chemotherapy   Started SQ Velcade once weekly, 3 weeks on, 1 weeks off; daily Revlimid d1-21, 7 days off; and Dexamethasone 18m PO weekly.    02/12/2013 Bone Marrow Transplant   [facility]    06/14/2013 Tumor Marker   IgG  1830,  Kappa:lamba ratio, 1.69 (baseline was 34.75 or   M-spike 0.28 (baseline of 2.6 or  89.3% of baseline).     06/25/2013 -  Chemotherapy   Started zometa 3.5 mg monthly    09/01/2013 - 01/2014 Chemotherapy   Maintenance therapy with revlimid 551mdaily for 14 days and then off for 7 (decreased from 21 days on and 7 days off based on neutropenia). Stopped due to disease progression 01/2017   09/13/2013 - 09/17/2014 Hospital Admission   Hospital admission for pneumonia   12/20/2013 Treatment Plan Change   Maintenance therapy decreased to 2.5 mg daily for 21 days and then off for 7 days based on low counts.    01/25/2014 Tumor Marker  IgG 1360 M spike 0.5   03/01/2014 - 03/05/2014 Hospital Admission   [facility] center with an inferior STEMI, received thrombolytic therapy, cath showed 60% prox RCA, mid-distal RCA 50%, Echo normal, no stents.   04/11/2014 - 08/07/2014 Chemotherapy   Continue Revlimid at 2.5 mg 3 weeks on 1 week off   08/08/2014 - 08/13/2014 Hospital Admission   Hospital admission for pneumonia. Her Revlimid was held.   08/29/2014 - 09/14/2014 Chemotherapy   Maintenance Revlimid restarted    09/14/2014 - 09/17/2014 Hospital Admission   Admitted for pneumonia after coming back from a trip in [name]. Revlimid was held again.   11/13/2014 - 12/25/2015 Chemotherapy   Maintenance Revlimid restarted, changed to 2.56m daily, 2 weks on, 1 week off from 12/15/2014, stopped due to disease progression    01/24/2016 Progression   Patient is M protein has gradually increased to 1.0g, repeated a bone marrow biopsy showed plasma cell 10-30%   02/27/2016 - 03/07/2016 Chemotherapy   Pomalidomide 4 mg daily on day 1-21, dexamethasone 40 mg on day 1, 8 and 15, every 28 days, started on 02/27/2016, daratumumab weekly started on 03/07/2016, held after first dose, due to hospitalization and a severe pancytopenia, pomalidomide was stopped afterwards    03/10/2016 - 03/29/2016 Hospital Admission   Pt was admitted for sepsis from pneumonia, and severe pancytopenia. She required ICU stay for a few days due to hypotension, developed b/l pleural effusion required diuretics and b/l thoracentesis. She also required blood and plt transfusion, and prolonged neupogen injection for severe neutropenia. She was discharged home after 3 weeks hospital stay    04/26/2016 - 04/26/2019 Chemotherapy   Daratumumab restarted on 04/26/2016, Velcade 1.377mm2 and dexa 2043meekly start on 11/3, velcade held since 06/28/2016 due to CHF, dexa stopped on12/11/2017 due to high risk of fracture.  Dara reduced to every 4 weeks due to COVID-19 starting 10/23/18. Returned to every 3 weeks starting 12/17/18. Restarted every 2 weeks starting 02/22/19 Stopped on 04/26/19 due to disease progression.    08/26/2016 Echocardiogram   - Left ventricle: LVEF is approximately 35% with diffuse diffuse   hypokinesis with severe hypokinesis/ akinesis of the   inferior/inferoseptal walls. In comparison to echo images from   December 2017, there does not appear to be significant change The   cavity size was normal. Wall thickness was normal. Doppler    parameters are consistent with abnormal left ventricular   relaxation (grade 1 diastolic dysfunction). - Aortic valve: AV is thickened. 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 Present in echo from December 2017 There was   trivial regurgitation. - Right ventricle: Systolic function was mildly reduced. - Right atrium: The atrium was mildly dilated. - Pericardium, extracardiac: A trivial pericardial effusion was   identified.   12/03/2016 Echocardiogram   -Left Ventricle: Systolic function was   mildly to moderately reduced. The estimated ejection fraction was   in the range of 40% to 45%. Diffuse hypokinesis. Doppler   parameters are consistent with abnormal left ventricular   relaxation (grade 1 diastolic dysfunction). Doppler parameters   are consistent with indeterminate ventricular filling pressure. - Left atrium: The atrium was severely dilated. - Tricuspid valve: There was trivial regurgitation. - Pulmonary arteries: Systolic pressure was within the normal   range. PA peak pressure: 33 mm Hg (S). - Global longitudinal strain -10.5% (abnormal)   03/12/2017 Echocardiogram   ECHO 03/12/17 Study Conclusions - Left ventricle:  The cavity size was normal. Wall thickness was   increased in a pattern of mild LVH. Systolic function was normal.   The estimated ejection fraction was in the range of 50% to 55%.   There is hypokinesis of the basalinferior myocardium. Doppler   parameters are consistent with abnormal left ventricular   relaxation (grade 1 diastolic dysfunction). - Aortic valve: There was trivial regurgitation. - Mitral valve: Calcified annulus. Mildly thickened leaflets .   There was trivial regurgitation. - Tricuspid valve: There was mild regurgitation. Impressions: - There has been mild improvement in EF since prior study.    12/12/2017 - 12/15/2017 Hospital Admission   Admission diagnosis: Pnuemonia and  acute hypoxic respiratory failure Additional comments: treated for pneumoania and acute hypoxic respiratory failure before she was discharged on 12/15/17 with oral anitbiotics. She did not require home oxygen.    12/30/2017 - 01/02/2018 Hospital Admission   Admission diagnosis: Pnuemonia  Additional comments: She was again hospitalized on 12/30/17 for pneumonia, ID work up was otherwise negative, she was discharged home with oral antibiotics.    04/01/2018 Imaging   04/01/2018 CXR IMPRESSION: Persisting airspace disease superimposed on chronic changes, suggesting pneumonia given the history. Followup PA and lateral chest X-ray is recommended in 3-4 weeks following therapy to ensure resolution and exclude underlying malignancy.  Redemonstration of mid and lower thoracic compression fractures with associated thoracic kyphosis.   04/23/2018 Echocardiogram   04/23/2018 ECHO LV EF: 45% -   50%   01/07/2019 Imaging   MRI Brain 01/07/19 IMPRESSION: MRI HEAD IMPRESSION:   1. 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 Lesion closely approximates the left inner ear structures and involves the left Meckel's cave and left fifth cranial nerve, and most likely is the causative etiology for patient's underlying left ear and facial symptoms. Follow-up examination with postcontrast imaging suggested for complete evaluation. Inclusion of IAC sequences would likely be helpful for visualization. 2. Additional 2.7 cm lesion involving the right occipital calvarium with adjacent marrow signal abnormality, also likely related to multiple myeloma. Metastatic disease would be the primary differential consideration. 3. Underlying age-appropriate cerebral atrophy with mild chronic microvascular ischemic disease. No other acute intracranial abnormality.   MRA HEAD IMPRESSION:    1. Negative intracranial MRA with no large vessel occlusion, hemodynamically significant stenosis, or other acute vascular abnormality. 2. Petrous and cavernous left ICA is partially encased and surrounded by the left petrous apex lesion without significant irregularity or stenosis. 3. 2 mm right paraophthalmic aneurysm.   MRA NECK IMPRESSION:   Normal MRA of the neck with wide patency of the carotid and vertebral arteries bilaterally. No hemodynamically significant stenosis or other acute vascular abnormality identified.    01/14/2019 - 01/27/2019 Radiation Therapy   By Dr. Tammi Klippel.    Diagnosis:   79 yo woman with skull base myeloma lesion causing left ear pain      Indication for treatment:  Palliative        Radiation treatment dates:   01/14/2019 - 01/27/2019   Site/dose:   Skull Base / 20 Gy in 10 fractions   Beams/energy:   3D, photons / 10X, 6X   04/01/2019 Pathology Results   Bone Marrow biopsy 04/01/19  DIAGNOSIS: BONE MARROW, ASPIRATE, CLOT, CORE: - Normocellular marrow with minimal involvement by plasma cell neoplasm - See comment PERIPHERAL BLOOD: - Macrocytic anemia and leukopenia -See complete blood count  04/09/2019 PET scan   PET 04/09/19  IMPRESSION: 1. Scattered lytic lesions of bone. A few of these demonstrate hypermetabolic activity, most notably the right occipital bone lesion; the lytic lesion in the approximately C7 spinous process; the lytic expansile lesion of the left fourth rib anteriorly; and a small lytic focus in the right femoral head. Other scattered lytic lesions have only low-grade activity. 2. Multiple fractures as detailed above. 3. Other imaging findings of potential clinical significance: Aortic Atherosclerosis (ICD10-I70.0). Coronary atherosclerosis.   05/10/2019 -  Chemotherapy   -CyBorD (cyclophosphamide (CYTOXAN) 566m (3091mm2), Velcade 1.27m19m2, and dexa 65m78meekly) starting 05/24/19.   -Due to neuropathy after 2 doses of  Velcade, I switched to weekly Kyprolis 3 weeks on/1 week off on 06/14/19.   -Chemo held for RT 07/05/19-07/22/19.   -Dexa stopped and Cytoxan dose reduced to 400 mg on day 1, 8 and 15 every 28 days, from cycle 3.     07/05/2019 - 07/20/2019 Radiation Therapy   By Dr. MannTammi Klippeliagnosis:   78 y36woman with painful right occipital and left hip involvement by multiple myeloma      Indication for treatment:  Palliation        Radiation treatment dates:   07/05/19-07/20/19   Site/dose:    1.  The Right Occipital skull lesion was treated to 20 Gy in 20 fractions of 2 Gy 2.  The Left Hip lesion was treated to 20 Gy in 20 fractions of 2 Gy   Beams/energy:      1.  The Right Occipital skull lesion was treated using a 3-field 3D set-up 2.  The Left Hip lesion was treated anterior and posterior fields.      REVIEW OF SYSTEMS:   Constitutional: Denies fevers, chills Respiratory: Has a cough with eating or drinking Cardiovascular: Denies palpitation, chest discomfort Gastrointestinal:  Denies nausea, heartburn or change in bowel habits Skin: Has a rash on her right lower extremity with weeping Lymphatics: Denies new lymphadenopathy or easy bruising Neurological:Denies numbness, tingling or new weaknesses Behavioral/Psych: Mood is stable, no new changes  All other systems were reviewed with the patient and are negative.  I have reviewed the past medical history, past surgical history, social history and family history with the patient and they are unchanged from previous note.   PHYSICAL EXAMINATION: ECOG PERFORMANCE STATUS: 2 - Symptomatic, <50% confined to bed  Vitals:   03/16/20 1254 03/16/20 1402  BP: (!) 82/55 104/69  Pulse: 83 95  Resp: (!) 23 (!) 23  Temp:  97.9 F (36.6 C)  SpO2: 95% 93%   Filed Weights   03/15/20 1026  Weight: 51.9 kg    Intake/Output from previous day: 09/01 0701 - 09/02 0700 In: 1330 [IV Piggyback:1330] Out: -   GENERAL: Chronically ill-appearing  female, no distress SKIN: Right lower extremity erythematous and edematous EYES: normal, Conjunctiva are pink and non-injected, sclera clear LUNGS: Nonlabored breathing, rales HEART: regular rate & rhythm and no murmurs ABDOMEN:abdomen soft, non-tender and normal bowel sounds NEURO: alert & oriented x 3 with fluent speech, no focal motor/sensory deficits  LABORATORY DATA:  I have reviewed the data as listed CMP Latest Ref Rng & Units 03/16/2020 03/15/2020 03/09/2020  Glucose 70 - 99 mg/dL 97 121(H) 128(H)  BUN 8 - 23 mg/dL 31(H) 23 24(H)  Creatinine 0.44 - 1.00 mg/dL 1.17(H) 0.80 0.85  Sodium 135 - 145 mmol/L 138 138 138  Potassium 3.5 - 5.1 mmol/L 3.6 3.8 4.1  Chloride 98 -  111 mmol/L 109 108 109  CO2 22 - 32 mmol/L 19(L) 19(L) 24  Calcium 8.9 - 10.3 mg/dL 8.4(L) 9.1 9.5  Total Protein 6.5 - 8.1 g/dL - - 6.0(L)  Total Bilirubin 0.3 - 1.2 mg/dL - - 1.2  Alkaline Phos 38 - 126 U/L - - 95  AST 15 - 41 U/L - - 20  ALT 0 - 44 U/L - - 33    Lab Results  Component Value Date   WBC 2.3 (L) 03/16/2020   HGB 8.1 (L) 03/16/2020   HCT 24.1 (L) 03/16/2020   MCV 105.2 (H) 03/16/2020   PLT 102 (L) 03/16/2020   NEUTROABS 6.3 03/15/2020    DG Chest 2 View  Result Date: 03/15/2020 CLINICAL DATA:  Weakness and right leg pain. EXAM: CHEST - 2 VIEW COMPARISON:  08/18/2018 FINDINGS: Patchy bilateral airspace disease. Cardiomegaly and vascular pedicle widening. There is a small right pleural effusion. No pneumothorax. Porta catheter with tip at the right atrium. Remote spinal, rib, and left clavicle fracture deformities. There is history of multiple myeloma with diffuse demineralization. IMPRESSION: Bilateral airspace disease that could be infection or failure. Small right pleural effusion interval cardiomegaly favors failure. Electronically Signed   By: Monte Fantasia M.D.   On: 03/15/2020 11:05   DG Tibia/Fibula Right  Result Date: 03/15/2020 CLINICAL DATA:  Right leg pain with multiple wounds.  Multiple myeloma. EXAM: RIGHT TIBIA AND FIBULA - 2 VIEW COMPARISON:  None. FINDINGS: Nonspecific subcutaneous reticulation. No evidence of fracture. There is generalized osteopenia without visible myeloma in the covered skeleton. IMPRESSION: Generalized soft tissue swelling without acute or malignant osseous finding. Electronically Signed   By: Monte Fantasia M.D.   On: 03/15/2020 11:06   IR IMAGING GUIDED PORT INSERTION  Result Date: 03/07/2020 INDICATION: 79 year old female with multiple myeloma in relapse. She requires durable venous access. EXAM: IMPLANTED PORT A CATH PLACEMENT WITH ULTRASOUND AND FLUOROSCOPIC GUIDANCE MEDICATIONS: 1 g vancomycin; The antibiotic was administered within an appropriate time interval prior to skin puncture. ANESTHESIA/SEDATION: Versed 1 mg IV; Fentanyl 50 mcg IV; Moderate Sedation Time:  12 minutes The patient was continuously monitored during the procedure by the interventional radiology nurse under my direct supervision. FLUOROSCOPY TIME:  0 minutes, 36 seconds (5 mGy) COMPLICATIONS: None immediate. PROCEDURE: The right neck and chest was prepped with chlorhexidine, and draped in the usual sterile fashion using maximum barrier technique (cap and mask, sterile gown, sterile gloves, large sterile sheet, hand hygiene and cutaneous antiseptic). Local anesthesia was attained by infiltration with 1% lidocaine with epinephrine. Ultrasound demonstrated patency of the right internal jugular vein, and this was documented with an image. Under real-time ultrasound guidance, this vein was accessed with a 21 gauge micropuncture needle and image documentation was performed. A small dermatotomy was made at the access site with an 11 scalpel. A 0.018" wire was advanced into the SVC and the access needle exchanged for a 59F micropuncture vascular sheath. The 0.018" wire was then removed and a 0.035" wire advanced into the IVC. An appropriate location for the subcutaneous reservoir was selected  below the clavicle and an incision was made through the skin and underlying soft tissues. The subcutaneous tissues were then dissected using a combination of blunt and sharp surgical technique and a pocket was formed. A single lumen low-profile power injectable portacatheter was then tunneled through the subcutaneous tissues from the pocket to the dermatotomy and the port reservoir placed within the subcutaneous pocket. The venous access site was then serially dilated  and a peel away vascular sheath placed over the wire. The wire was removed and the port catheter advanced into position under fluoroscopic guidance. The catheter tip is positioned in the superior cavoatrial junction. This was documented with a spot image. The portacatheter was then tested and found to flush and aspirate well. The port was flushed with saline followed by 100 units/mL heparinized saline. The pocket was then closed in two layers using first subdermal inverted interrupted absorbable sutures followed by a running subcuticular suture. The epidermis was then sealed with Dermabond. The dermatotomy at the venous access site was also closed with Dermabond. IMPRESSION: Successful placement of a right IJ approach Power Port with ultrasound and fluoroscopic guidance. The catheter is ready for use. Electronically Signed   By: Jacqulynn Cadet M.D.   On: 03/07/2020 16:48   VAS [place] LOWER EXTREMITY VENOUS (DVT) (ONLY [facility] & [facility])  Result Date: 03/15/2020  Lower Venous DVTStudy Indications: Edema.  Limitations: Body habitus, poor ultrasound/tissue interface and patient position. Comparison Study: No prior study Performing Technologist: Maudry Mayhew MHA, RDMS, RVT, RDCS  Examination Guidelines: A complete evaluation includes B-mode imaging, spectral Doppler, color Doppler, and power Doppler as needed of all accessible portions of each vessel. Bilateral testing is considered an integral part of a complete examination. Limited examinations for  reoccurring indications may be performed as noted. The reflux portion of the exam is performed with the patient in reverse Trendelenburg.  +---------+---------------+---------+-----------+----------+--------------+ RIGHT    CompressibilityPhasicitySpontaneityPropertiesThrombus Aging +---------+---------------+---------+-----------+----------+--------------+ CFV      Full           No       Yes                                 +---------+---------------+---------+-----------+----------+--------------+ SFJ      Full                                                        +---------+---------------+---------+-----------+----------+--------------+ FV Prox  Full                                                        +---------+---------------+---------+-----------+----------+--------------+ FV Mid   Full                                                        +---------+---------------+---------+-----------+----------+--------------+ FV DistalFull                                                        +---------+---------------+---------+-----------+----------+--------------+ PFV      Full                                                        +---------+---------------+---------+-----------+----------+--------------+  POP      Full           No       Yes                                 +---------+---------------+---------+-----------+----------+--------------+ PTV      Full                                                        +---------+---------------+---------+-----------+----------+--------------+ PERO     Full                                                        +---------+---------------+---------+-----------+----------+--------------+   +---------+---------------+---------+-----------+----------+--------------+ LEFT     CompressibilityPhasicitySpontaneityPropertiesThrombus Aging  +---------+---------------+---------+-----------+----------+--------------+ CFV      Full           No       Yes                                 +---------+---------------+---------+-----------+----------+--------------+ SFJ      Full                                                        +---------+---------------+---------+-----------+----------+--------------+ FV Prox  Full                                                        +---------+---------------+---------+-----------+----------+--------------+ FV Mid   Full                                                        +---------+---------------+---------+-----------+----------+--------------+ FV DistalFull                                                        +---------+---------------+---------+-----------+----------+--------------+ PFV      Full                                                        +---------+---------------+---------+-----------+----------+--------------+ POP      Full           No       Yes                                 +---------+---------------+---------+-----------+----------+--------------+  PTV      Full                                                        +---------+---------------+---------+-----------+----------+--------------+ PERO     Full                                                        +---------+---------------+---------+-----------+----------+--------------+     Summary: RIGHT: - There is no evidence of deep vein thrombosis in the lower extremity.  - No cystic structure found in the popliteal fossa.  LEFT: - There is no evidence of deep vein thrombosis in the lower extremity.  - A cystic structure is found in the popliteal fossa.  Bilateral lower extremity veins exhibit pulsatile flow, suggestive of possibly elevated right heart pressure.  *See table(s) above for measurements and observations. Electronically signed by Curt Jews MD on 03/15/2020 at 3:12:07  PM.    Final    ECHOCARDIOGRAM LIMITED  Result Date: 03/16/2020    ECHOCARDIOGRAM LIMITED REPORT   Patient Name:   JIMESHA RISING Date of Exam: 03/16/2020 Medical Rec #:  [id number]      Height:       66.0 in Accession #:    [id number]     Weight:       114.4 lb Date of Birth:  December 18, 1940     BSA:          1.577 m Patient Age:    47 years       BP:           74/50 mmHg Patient Gender: F              HR:           78 bpm. Exam Location:  Inpatient Procedure: Limited Echo, Color Doppler and Cardiac Doppler Indications:    F81.01 Acute diastolic (congestive) heart failure  History:        Patient has prior history of Echocardiogram examinations, most                 recent 11/08/2019. CHF; Risk Factors:Hypertension.  Sonographer:    Raquel Sarna Senior RDCS Referring Phys: [id number] Harold Hedge  Sonographer Comments: Technically difficult due to small rib spacing. IMPRESSIONS  1. Abnormal septal motion/flattening inferior wall hypokinesis . Left ventricular ejection fraction, by estimation, is 35 to 40%. The left ventricle has moderately decreased function. The left ventricle demonstrates regional wall motion abnormalities (see scoring diagram/findings for description). The left ventricular internal cavity size was mildly dilated. Left ventricular diastolic parameters are indeterminate.  2. Marked RV dysfunction . Right ventricular systolic function is severely reduced. The right ventricular size is moderately enlarged. There is moderately elevated pulmonary artery systolic pressure.  3. Left atrial size was moderately dilated.  4. Right atrial size was moderately dilated.  5. Mild mitral valve regurgitation.  6. Tricuspid valve regurgitation is moderate.  7. The aortic valve is tricuspid. Aortic valve regurgitation is trivial. Mild to moderate aortic valve sclerosis/calcification is present, without any evidence of aortic stenosis.  8. The inferior vena cava is dilated in size with <50% respiratory variability, suggesting  right  atrial pressure of 15 mmHg. FINDINGS  Left Ventricle: Abnormal septal motion/flattening inferior wall hypokinesis. Left ventricular ejection fraction, by estimation, is 35 to 40%. The left ventricle has moderately decreased function. The left ventricle demonstrates regional wall motion abnormalities. The left ventricular internal cavity size was mildly dilated. Right Ventricle: Marked RV dysfunction. The right ventricular size is moderately enlarged. Right vetricular wall thickness was not assessed. Right ventricular systolic function is severely reduced. There is moderately elevated pulmonary artery systolic pressure. The tricuspid regurgitant velocity is 2.78 m/s, and with an assumed right atrial pressure of 15 mmHg, the estimated right ventricular systolic pressure is 41.3 mmHg. Left Atrium: Left atrial size was moderately dilated. Right Atrium: Right atrial size was moderately dilated. Pericardium: There is no evidence of pericardial effusion. Mitral Valve: There is mild thickening of the mitral valve leaflet(s). There is mild calcification of the mitral valve leaflet(s). Mild mitral valve regurgitation. Tricuspid Valve: Tricuspid valve regurgitation is moderate. Aortic Valve: The aortic valve is tricuspid. Aortic valve regurgitation is trivial. Mild to moderate aortic valve sclerosis/calcification is present, without any evidence of aortic stenosis. Pulmonic Valve: The pulmonic valve was not well visualized. Venous: The inferior vena cava is dilated in size with less than 50% respiratory variability, suggesting right atrial pressure of 15 mmHg. LEFT VENTRICLE PLAX 2D LVOT diam:     1.90 cm LV SV:         41 LV SV Index:   26 LVOT Area:     2.84 cm  LV Volumes (MOD) LV vol d, MOD A2C: 115.0 ml LV vol d, MOD A4C: 94.5 ml LV vol s, MOD A2C: 69.0 ml LV vol s, MOD A4C: 63.3 ml LV SV MOD A2C:     46.0 ml LV SV MOD A4C:     94.5 ml LV SV MOD BP:      35.1 ml RIGHT VENTRICLE RV S prime:     5.44 cm/s TAPSE  (M-mode): 1.0 cm AORTIC VALVE LVOT Vmax:   75.10 cm/s LVOT Vmean:  53.400 cm/s LVOT VTI:    0.146 m TRICUSPID VALVE TR Peak grad:   30.9 mmHg TR Vmax:        278.00 cm/s  SHUNTS Systemic VTI:  0.15 m Systemic Diam: 1.90 cm Jenkins Rouge MD Electronically signed by Jenkins Rouge MD Signature Date/Time: 03/16/2020/8:46:18 AM    Final     ASSESSMENT AND PLAN: 1.  IgG kappa multiple myeloma 2.  Sepsis 3.  Right lower extremity cellulitis 4.  Acute biventricular heart failure 5.  Pseudomonas bacteremia 6.  AKI 7.  Cough-?  Aspiration 8.  Goals of care, DNR   -The patient has improved somewhat since admission.  Her blood pressures are slowly improving with hydration and treatment of her bacteremia.  PCCM is following very closely and have added midodrine and stress steroids. -The patient was noted to have Pseudomonas bacteremia.  ID has been consulted and has recommended cefepime. -Cardiology has been consulted for her heart failure.  Acute heart failure could be related to her chemotherapy.  Will have a CTA of the chest to evaluate for PE. -Continue IV fluids for AKI. -The patient has had coughing after eating and drinking.  She states has been going on for some time and is worsening.  Recommend speech therapy evaluation.  Speech therapy did try to evaluate her earlier today and the patient declined this.  She states that she would agree to this evaluation when she is feeling better. -Chemotherapy will be placed  on hold.   -Goals of care discussed with the patient.  Agree with DNR/DNI.  The patient is agreeable to continue aggressive interventions to see if she shows improvement.  However, if she is not improving, she would agree to transitioning to a comfort based approach.  I have spoken with the patient's daughter, [name], who is an OB/GYN and I have reviewed her test results and plan of care in detail with the patient's permission.  The patient's daughter is out of state and would like to be updated  if there is any change in her condition.   LOS: 1 day   Mikey Bussing, DNP, AGPCNP-BC, AOCNP 03/16/20   Addendum  I have seen the patient, examined her. I agree with the assessment and and plan and have edited the notes.   I have reviewed her lab and test results, and explained to her and her husband at bedside.  Her overall condition has been deteriorating lately due to the multiple myeloma progression and chemotherapy.  Unfortunately she has septic shock now, with congestive heart failure.  I appreciate the excellent care from hospitalist, pulmonary and critical care cardiology service.  I agree with antibiotics, supportive care, pressors if needed.  Patient has living well, does not want resuscitation, her husband also agrees.  We discussed that she is critically ill, but certainly still has chance to get through this and recover, although her overall condition will likely deteriorated to severe illness.  Even if she recovers, plan to repeat her current for her multiple myeloma, due to the cardiac toxicity from Kyprolis.  Patient and her husband understand the overall poor prognosis, and seem to be open to comfort care if things do not go well. She does not want to survive with very poor quality of life. I will continue f/u.   [name]  03/16/2020

## 2020-03-16 NOTE — Progress Notes (Signed)
PROGRESS NOTE  Shannon Rush  OFB:[id number] DOB: [birth year] DOA: 03/15/2020 PCP: [name], MD   Brief Narrative: Per HPI: This is a 79 year old female with past medical history of multiple myeloma on Cytoxan, Kyprolis, Dexa (follows with Dr. Burr Medico), diastolic heart failure (EF 55 to 60% in April), CAD, inferior STEMI in 2015, pancytopenia, osteoporosis who was admitted to [facility] on 7/15 for left lower extremity cellulitis and developed right leg edema on 7/30 with a negative Doppler at the time and was put on Lasix for swelling which initially improved who has had increasing pain and swelling of the RLE over the past few days as well as fatigue.  No relief despite home morphine, normally takes one quarter of her morphine pill but had to take the full dose last night and this morning.  Has noted some weeping from her extremity.  Per husband no fever or chills, no abdominal pain, no chest pain, shortness of breath, nausea or vomiting.  Patient continued to fall asleep during exam and was unable to provide a full ROS  ED Course: Vitals: Hemodynamically stable on room air.  Notable labs: Bicarb 19, glucose 121, BNP 2409, lactic acid 1.7, WBC 7.4, Hb 8.8, platelets 131, COVID-19 negative.  Bilateral lower extremity ultrasound: Negative for DVT but suggestive of possible elevated right heart pressure.  Received 1 L NS bolus for infection and possible sepsis however CXR was concerning for probable heart failure and further fluids were discontinued.  Was given vancomycin and Ancef x1"  Hospital Course: Blood cultures identified Pseudomonas, hypotension worsened, echocardiogram revealed RV and LV systolic dysfunction.   Assessment & Plan: Principal Problem:   Cellulitis Active Problems:   Multiple myeloma (HCC)   Acute diastolic CHF (congestive heart failure) (HCC)   Acute metabolic encephalopathy   Essential hypertension  Hypotension: Multifactorial with diminished cardiac output as well as  sepsis due to bacteremia.  - PCCM consulted given persistent hypotension. Renal function beginning to worsen and mentation, while relatively clear, may not be completely at baseline. Lactic acid, however, remains wnl, so will hold pressor support at this time.  - Stress steroids added since she takes dexamethasone with chemotherapy.  - Midodrine added per PCCM.  - Change admission to SDU for now.  Acute biventricular heart failure, HTN: RV dysfunction appears to be new on echocardiogram.  - Cardiology consulted. CTA chest r/o PE pending - Unable to administer beta blocker with hypotension - Hold further lasix unless cardiology feels this is necessary  Pseudomonas bacteremia:  - ID, Dr. [place] Bali, consulted for further recommendations.  - Switched to vancomycin, meropenem to cefepime which the patient has tolerated thus far.  RLE cellulitis: In setting of chemotherapy, MM, opportunistic infection with pseudomonas is feasible. Swelling noted, negative venous U/S for DVT, though pulsations suggested high RV pressures. - Continue antibiotics as above.   AKI: Likely from hypoperfusion due to hypotension.  - Continue monitoring closely, specifically with concern for contrast-induced nephropathy after CTA.  Multiple myeloma in relapse: Recently had port inserted and restarted chemotherapy under Dr. Burr Medico and [facility] oncology Dr. [name].  - Port site does not appear infected at this time.  - Dr. Burr Medico to evaluate the patient.   HLD:  - Statin  DVT prophylaxis: Lovenox Code Status: DNR, would desire pressors/ICU level of care. Family Communication: Daughter by phone this PM Disposition Plan:  Status is: Inpatient  Remains inpatient appropriate because:Hemodynamically unstable  Dispo: The patient is from: Home  Anticipated d/c is to: TBD              Anticipated d/c date is: 3 days              Patient currently is not medically stable to d/c.  Consultants:    PCCM  ID  Oncology  Cardiology  Procedures:   Echocardiogram  Antimicrobials:  Vancomycin, ancef, meropenem 9/1  Acyclovir 9/1 >>  Cefepime 9/2 >>   Subjective: No orthopnea or chest pain. Shortness of breath is stable, feels weak severely.   Objective: Vitals:   03/16/20 1100 03/16/20 1145 03/16/20 1254 03/16/20 1402  BP: (!) 89/57 (!) 88/58 (!) 82/55 104/69  Pulse: 81 86 83 95  Resp: 17 18 (!) 23 (!) 23  Temp:    97.9 F (36.6 C)  TempSrc:    Oral  SpO2: 93% 91% 95% 93%  Weight:      Height:        Intake/Output Summary (Last 24 hours) at 03/16/2020 1433 Last data filed at 03/15/2020 1707 Gross per 24 hour  Intake 100 ml  Output --  Net 100 ml   Filed Weights   03/15/20 1026  Weight: 51.9 kg   Gen: Older thin female in no distress Pulm: Non-labored breathing, crackles.  CV: Regular rate and rhythm. No murmur, rub, or gallop. + JVD, + bilateral dependent edema. GI: Abdomen soft, non-tender, non-distended, with normoactive bowel sounds. No organomegaly or masses felt. Ext: Warm, no deformities Skin: RLE tender, warm and erythematous. Neuro: Alert and oriented. No focal neurological deficits. Psych: Judgement and insight appear relatively normal. Mood & affect appropriate.   Data Reviewed: I have personally reviewed following labs and imaging studies  CBC: Recent Labs  Lab 03/15/20 1021 03/16/20 0551  WBC 7.4 2.3*  NEUTROABS 6.3  --   HGB 8.8* 8.1*  HCT 26.9* 24.1*  MCV 106.3* 105.2*  PLT 131* 570*   Basic Metabolic Panel: Recent Labs  Lab 03/15/20 1021 03/16/20 1100  NA 138 138  K 3.8 3.6  CL 108 109  CO2 19* 19*  GLUCOSE 121* 97  BUN 23 31*  CREATININE 0.80 1.17*  CALCIUM 9.1 8.4*   GFR: Estimated Creatinine Clearance: 33 mL/min (A) (by C-G formula based on SCr of 1.17 mg/dL (H)).  Urine analysis:    Component Value Date/Time   COLORURINE YELLOW 08/18/2018 1313   APPEARANCEUR CLEAR 08/18/2018 1313   LABSPEC 1.013 08/18/2018  1313   LABSPEC 1.015 08/20/2012 1124   PHURINE 7.0 08/18/2018 1313   GLUCOSEU NEGATIVE 08/18/2018 1313   GLUCOSEU Negative 08/20/2012 1124   HGBUR NEGATIVE 08/18/2018 1313   HGBUR negative 10/22/2007 1336   BILIRUBINUR NEGATIVE 08/18/2018 1313   BILIRUBINUR Negative 08/20/2012 1124   KETONESUR 5 (A) 08/18/2018 1313   PROTEINUR 30 (A) 08/18/2018 1313   UROBILINOGEN 0.2 09/14/2014 2100   UROBILINOGEN 0.2 08/20/2012 1124   NITRITE NEGATIVE 08/18/2018 1313   LEUKOCYTESUR NEGATIVE 08/18/2018 1313   LEUKOCYTESUR Small 08/20/2012 1124   Recent Results (from the past 240 hour(s))  Aerobic Culture (superficial specimen)     Status: Abnormal (Preliminary result)   Collection Time: 03/15/20 10:21 AM   Specimen: Leg  Result Value Ref Range Status   Specimen Description   Final    LEG RIGHT Performed at [facility] [address] [place], [place] [address]    Special Requests   Final    Immunocompromised Performed at [facility], [address] [place],  [place] [address]    Gram Stain   Final    NO WBC SEEN MODERATE GRAM VARIABLE ROD FEW GRAM POSITIVE COCCI FEW GRAM NEGATIVE RODS Performed at [facility], [place] [address] [place], [place] [address]    Culture MULTIPLE ORGANISMS PRESENT, NONE PREDOMINANT (A)  Final   Report Status PENDING  Incomplete  SARS Coronavirus 2 by RT PCR (hospital order, performed in [facility] hospital lab) Nasopharyngeal Nasopharyngeal Swab     Status: None   Collection Time: 03/15/20 10:26 AM   Specimen: Nasopharyngeal Swab  Result Value Ref Range Status   SARS Coronavirus 2 NEGATIVE NEGATIVE Final    Comment: (NOTE) SARS-CoV-2 target nucleic acids are NOT DETECTED.  The SARS-CoV-2 RNA is generally detectable in upper and lower respiratory specimens during the acute phase of infection. The lowest concentration of SARS-CoV-2 viral copies this assay can detect is 250 copies / mL. A negative result does  not preclude SARS-CoV-2 infection and should not be used as the sole basis for treatment or other patient management decisions.  A negative result may occur with improper specimen collection / handling, submission of specimen other than nasopharyngeal swab, presence of viral mutation(s) within the areas targeted by this assay, and inadequate number of viral copies (<250 copies / mL). A negative result must be combined with clinical observations, patient history, and epidemiological information.  Fact Sheet for Patients:   StrictlyIdeas.no  Fact Sheet for Healthcare Providers: BankingDealers.co.za  This test is not yet approved or  cleared by the [place] FDA and has been authorized for detection and/or diagnosis of SARS-CoV-2 by FDA under an Emergency Use Authorization (EUA).  This EUA will remain in effect (meaning this test can be used) for the duration of the COVID-19 declaration under Section 564(b)(1) of the Act, 21 U.S.C. section 360bbb-3(b)(1), unless the authorization is terminated or revoked sooner.  Performed at [facility], [place] [address] [place], [place] [address]   Culture, blood (routine x 2)     Status: None (Preliminary result)   Collection Time: 03/15/20 11:10 AM   Specimen: BLOOD  Result Value Ref Range Status   Specimen Description   Final    BLOOD RIGHT ANTECUBITAL Performed at [facility] [address] [place], [place] [address]    Special Requests   Final    BOTTLES DRAWN AEROBIC AND ANAEROBIC Blood Culture adequate volume Performed at [facility] [address], [place], [place] [address]    Culture  Setup Time   Final    IN BOTH AEROBIC AND ANAEROBIC BOTTLES GRAM NEGATIVE RODS Organism ID to follow CRITICAL RESULT CALLED TO, READ BACK BY AND VERIFIED WITH: Sheffield Slider [facility] 03/16/20 0600 JDW Performed at [facility], [address], [place], [place] [address]    Culture GRAM NEGATIVE RODS  Final   Report Status PENDING  Incomplete  Blood Culture ID Panel (Reflexed)     Status: Abnormal   Collection Time: 03/15/20 11:10 AM  Result Value Ref Range Status   Enterococcus faecalis NOT DETECTED NOT DETECTED Final   Enterococcus Faecium NOT DETECTED NOT DETECTED Final   Listeria monocytogenes NOT DETECTED NOT DETECTED Final   Staphylococcus species NOT DETECTED NOT DETECTED Final   Staphylococcus aureus (BCID) NOT DETECTED NOT DETECTED Final   Staphylococcus epidermidis NOT DETECTED NOT DETECTED Final   Staphylococcus lugdunensis NOT DETECTED NOT DETECTED Final   Streptococcus species NOT DETECTED NOT DETECTED Final   Streptococcus  agalactiae NOT DETECTED NOT DETECTED Final   Streptococcus pneumoniae NOT DETECTED NOT DETECTED Final   Streptococcus pyogenes NOT DETECTED NOT DETECTED Final   A.calcoaceticus-baumannii NOT DETECTED NOT DETECTED Final   Bacteroides fragilis NOT DETECTED NOT DETECTED Final   Enterobacterales NOT DETECTED NOT DETECTED Final   Enterobacter cloacae complex NOT DETECTED NOT DETECTED Final   Escherichia coli NOT DETECTED NOT DETECTED Final   Klebsiella aerogenes NOT DETECTED NOT DETECTED Final   Klebsiella oxytoca NOT DETECTED NOT DETECTED Final   Klebsiella pneumoniae NOT DETECTED NOT DETECTED Final   Proteus species NOT DETECTED NOT DETECTED Final   Salmonella species NOT DETECTED NOT DETECTED Final   Serratia marcescens NOT DETECTED NOT DETECTED Final   Haemophilus influenzae NOT DETECTED NOT DETECTED Final   Neisseria meningitidis NOT DETECTED NOT DETECTED Final   Pseudomonas aeruginosa DETECTED (A) NOT DETECTED Final    Comment: CRITICAL RESULT CALLED TO, READ BACK BY AND VERIFIED WITH: Sheffield Slider PHARMD 03/16/20 0600 JDW    Stenotrophomonas maltophilia NOT DETECTED NOT DETECTED Final   Candida albicans NOT DETECTED NOT DETECTED Final   Candida auris NOT DETECTED NOT DETECTED Final    Candida glabrata NOT DETECTED NOT DETECTED Final   Candida krusei NOT DETECTED NOT DETECTED Final   Candida parapsilosis NOT DETECTED NOT DETECTED Final   Candida tropicalis NOT DETECTED NOT DETECTED Final   Cryptococcus neoformans/gattii NOT DETECTED NOT DETECTED Final   CTX-M ESBL NOT DETECTED NOT DETECTED Final   Carbapenem resistance IMP NOT DETECTED NOT DETECTED Final   Carbapenem resistance KPC NOT DETECTED NOT DETECTED Final   Carbapenem resistance NDM NOT DETECTED NOT DETECTED Final   Carbapenem resistance VIM NOT DETECTED NOT DETECTED Final    Comment: Performed at [facility], [address] [place], [place] [address]      Radiology Studies: DG Chest 2 View  Result Date: 03/15/2020 CLINICAL DATA:  Weakness and right leg pain. EXAM: CHEST - 2 VIEW COMPARISON:  08/18/2018 FINDINGS: Patchy bilateral airspace disease. Cardiomegaly and vascular pedicle widening. There is a small right pleural effusion. No pneumothorax. Porta catheter with tip at the right atrium. Remote spinal, rib, and left clavicle fracture deformities. There is history of multiple myeloma with diffuse demineralization. IMPRESSION: Bilateral airspace disease that could be infection or failure. Small right pleural effusion interval cardiomegaly favors failure. Electronically Signed   By: Monte Fantasia M.D.   On: 03/15/2020 11:05   DG Tibia/Fibula Right  Result Date: 03/15/2020 CLINICAL DATA:  Right leg pain with multiple wounds. Multiple myeloma. EXAM: RIGHT TIBIA AND FIBULA - 2 VIEW COMPARISON:  None. FINDINGS: Nonspecific subcutaneous reticulation. No evidence of fracture. There is generalized osteopenia without visible myeloma in the covered skeleton. IMPRESSION: Generalized soft tissue swelling without acute or malignant osseous finding. Electronically Signed   By: Monte Fantasia M.D.   On: 03/15/2020 11:06   VAS [place] LOWER EXTREMITY VENOUS (DVT) (ONLY [facility] & [facility])  Result Date: 03/15/2020  Lower Venous DVTStudy  Indications: Edema.  Limitations: Body habitus, poor ultrasound/tissue interface and patient position. Comparison Study: No prior study Performing Technologist: Maudry Mayhew MHA, RDMS, RVT, RDCS  Examination Guidelines: A complete evaluation includes B-mode imaging, spectral Doppler, color Doppler, and power Doppler as needed of all accessible portions of each vessel. Bilateral testing is considered an integral part of a complete examination. Limited examinations for reoccurring indications may be performed as noted. The reflux portion of the exam is performed with the patient in reverse Trendelenburg.  +---------+---------------+---------+-----------+----------+--------------+ RIGHT  CompressibilityPhasicitySpontaneityPropertiesThrombus Aging +---------+---------------+---------+-----------+----------+--------------+ CFV      Full           No       Yes                                 +---------+---------------+---------+-----------+----------+--------------+ SFJ      Full                                                        +---------+---------------+---------+-----------+----------+--------------+ FV Prox  Full                                                        +---------+---------------+---------+-----------+----------+--------------+ FV Mid   Full                                                        +---------+---------------+---------+-----------+----------+--------------+ FV DistalFull                                                        +---------+---------------+---------+-----------+----------+--------------+ PFV      Full                                                        +---------+---------------+---------+-----------+----------+--------------+ POP      Full           No       Yes                                 +---------+---------------+---------+-----------+----------+--------------+ PTV      Full                                                         +---------+---------------+---------+-----------+----------+--------------+ PERO     Full                                                        +---------+---------------+---------+-----------+----------+--------------+   +---------+---------------+---------+-----------+----------+--------------+ LEFT     CompressibilityPhasicitySpontaneityPropertiesThrombus Aging +---------+---------------+---------+-----------+----------+--------------+ CFV      Full           No       Yes                                 +---------+---------------+---------+-----------+----------+--------------+  SFJ      Full                                                        +---------+---------------+---------+-----------+----------+--------------+ FV Prox  Full                                                        +---------+---------------+---------+-----------+----------+--------------+ FV Mid   Full                                                        +---------+---------------+---------+-----------+----------+--------------+ FV DistalFull                                                        +---------+---------------+---------+-----------+----------+--------------+ PFV      Full                                                        +---------+---------------+---------+-----------+----------+--------------+ POP      Full           No       Yes                                 +---------+---------------+---------+-----------+----------+--------------+ PTV      Full                                                        +---------+---------------+---------+-----------+----------+--------------+ PERO     Full                                                        +---------+---------------+---------+-----------+----------+--------------+     Summary: RIGHT: - There is no evidence of deep vein thrombosis in the lower  extremity.  - No cystic structure found in the popliteal fossa.  LEFT: - There is no evidence of deep vein thrombosis in the lower extremity.  - A cystic structure is found in the popliteal fossa.  Bilateral lower extremity veins exhibit pulsatile flow, suggestive of possibly elevated right heart pressure.  *See table(s) above for measurements and observations. Electronically signed by Curt Jews MD on 03/15/2020 at 3:12:07 PM.    Final    ECHOCARDIOGRAM LIMITED  Result Date: 03/16/2020    ECHOCARDIOGRAM LIMITED REPORT   Patient  Name:   Karel Jarvis Date of Exam: 03/16/2020 Medical Rec #:  [id number]      Height:       66.0 in Accession #:    [id number]     Weight:       114.4 lb Date of Birth:  1940-11-08     BSA:          1.577 m Patient Age:    89 years       BP:           74/50 mmHg Patient Gender: F              HR:           78 bpm. Exam Location:  Inpatient Procedure: Limited Echo, Color Doppler and Cardiac Doppler Indications:    W97.94 Acute diastolic (congestive) heart failure  History:        Patient has prior history of Echocardiogram examinations, most                 recent 11/08/2019. CHF; Risk Factors:Hypertension.  Sonographer:    Raquel Sarna Senior RDCS Referring Phys: [id number] Harold Hedge  Sonographer Comments: Technically difficult due to small rib spacing. IMPRESSIONS  1. Abnormal septal motion/flattening inferior wall hypokinesis . Left ventricular ejection fraction, by estimation, is 35 to 40%. The left ventricle has moderately decreased function. The left ventricle demonstrates regional wall motion abnormalities (see scoring diagram/findings for description). The left ventricular internal cavity size was mildly dilated. Left ventricular diastolic parameters are indeterminate.  2. Marked RV dysfunction . Right ventricular systolic function is severely reduced. The right ventricular size is moderately enlarged. There is moderately elevated pulmonary artery systolic pressure.  3. Left atrial size was  moderately dilated.  4. Right atrial size was moderately dilated.  5. Mild mitral valve regurgitation.  6. Tricuspid valve regurgitation is moderate.  7. The aortic valve is tricuspid. Aortic valve regurgitation is trivial. Mild to moderate aortic valve sclerosis/calcification is present, without any evidence of aortic stenosis.  8. The inferior vena cava is dilated in size with <50% respiratory variability, suggesting right atrial pressure of 15 mmHg. FINDINGS  Left Ventricle: Abnormal septal motion/flattening inferior wall hypokinesis. Left ventricular ejection fraction, by estimation, is 35 to 40%. The left ventricle has moderately decreased function. The left ventricle demonstrates regional wall motion abnormalities. The left ventricular internal cavity size was mildly dilated. Right Ventricle: Marked RV dysfunction. The right ventricular size is moderately enlarged. Right vetricular wall thickness was not assessed. Right ventricular systolic function is severely reduced. There is moderately elevated pulmonary artery systolic pressure. The tricuspid regurgitant velocity is 2.78 m/s, and with an assumed right atrial pressure of 15 mmHg, the estimated right ventricular systolic pressure is 74.8 mmHg. Left Atrium: Left atrial size was moderately dilated. Right Atrium: Right atrial size was moderately dilated. Pericardium: There is no evidence of pericardial effusion. Mitral Valve: There is mild thickening of the mitral valve leaflet(s). There is mild calcification of the mitral valve leaflet(s). Mild mitral valve regurgitation. Tricuspid Valve: Tricuspid valve regurgitation is moderate. Aortic Valve: The aortic valve is tricuspid. Aortic valve regurgitation is trivial. Mild to moderate aortic valve sclerosis/calcification is present, without any evidence of aortic stenosis. Pulmonic Valve: The pulmonic valve was not well visualized. Venous: The inferior vena cava is dilated in size with less than 50% respiratory  variability, suggesting right atrial pressure of 15 mmHg. LEFT VENTRICLE PLAX 2D LVOT diam:     1.90 cm LV SV:  41 LV SV Index:   26 LVOT Area:     2.84 cm  LV Volumes (MOD) LV vol d, MOD A2C: 115.0 ml LV vol d, MOD A4C: 94.5 ml LV vol s, MOD A2C: 69.0 ml LV vol s, MOD A4C: 63.3 ml LV SV MOD A2C:     46.0 ml LV SV MOD A4C:     94.5 ml LV SV MOD BP:      35.1 ml RIGHT VENTRICLE RV S prime:     5.44 cm/s TAPSE (M-mode): 1.0 cm AORTIC VALVE LVOT Vmax:   75.10 cm/s LVOT Vmean:  53.400 cm/s LVOT VTI:    0.146 m TRICUSPID VALVE TR Peak grad:   30.9 mmHg TR Vmax:        278.00 cm/s  SHUNTS Systemic VTI:  0.15 m Systemic Diam: 1.90 cm Jenkins Rouge MD Electronically signed by Jenkins Rouge MD Signature Date/Time: 03/16/2020/8:46:18 AM    Final     Scheduled Meds: . aspirin EC  81 mg Oral Daily  . atorvastatin  20 mg Oral Daily  . calcium-vitamin D  1 tablet Oral Q breakfast  . enoxaparin (LOVENOX) injection  40 mg Subcutaneous Q24H  . hydrocortisone sod succinate (SOLU-CORTEF) inj  50 mg Intravenous Q6H  . metoprolol succinate  25 mg Oral Daily  . midodrine  5 mg Oral TID WC   Continuous Infusions: . acyclovir 400 mg (03/16/20 1108)  . ceFEPime (MAXIPIME) IV 2 g (03/16/20 0829)     LOS: 1 day   Time spent: 35 minutes.  Patrecia Pour, MD [facility] [email or web address] 03/16/2020, 2:33 PM

## 2020-03-16 NOTE — ED Notes (Signed)
Pt's B/Ps have been much more stable since second infusion of albumin. MAP has stayed above 60. Pt much more alert but with moments of hallucinations after waking. Pt has been joking and is able to voice her needs and concerns.

## 2020-03-16 NOTE — Consult Note (Signed)
[facility] Infectious [facility]                                                                                        Patient Identification: Patient Name: Shannon Rush MRN: [id number] [place] Date: 03/15/2020 10:09 AM Age: @AG @Today 's Date: 03/16/2020   Principal Problem:   Cellulitis Active Problems:   Multiple myeloma ([place])   Acute diastolic CHF (congestive heart failure) ([place])   Acute metabolic encephalopathy   Essential hypertension   Antibiotics/Antifungals: Acyclovir ppx, cefepime Day 1  Assessment 79 Y O female with a PMH of multiple myeloma (initially diagnosed in 2013, s/p autologous BMT  In 02/2013 at [facility], on chemotherapy with Cytoxan, Kyprolis, Dexa, last dose on 8/5, follows with Dr. Burr Medico), diastolic heart failure, CAD, pancytopenia, osteoporosis with   1. Pseudomonas bacteremia - She has a port in her RT chest that was recently replaced  On 8/24. Denies any pain/tenderness. No erythema/swelling and tenderness on exam - TTE5/2014  Large linear mobile target on aorta side of aortic valve & small mobile target on LV side of aortic valve.  - TTE 01/31/2020 Mildly thickened AV and thickened MV leaflets    2. ? Rt LE Cellulitis  - Does not have significant warmth and tenderness. But patient complains of pain and is more swollen than her left leg - Has a dog at home. Denies any licking or scratching by the dog. Denies any water exposure  3. Multiple Myeloma - followed by Hem Onc  Recommendations  Continue cefepime for GNR bacteremia ( pharmacy to dose based on renal dosing MRSA PCR screen TTE given above valvulopathy in prior TTE, Pseudomonas bacteremia.and immunocompromised status   Repeat blood cultures tomorrow including one from the port.  Following   Rest of the management as per the primary team. Thank you for the consult. Please call with questions or concerns.   Rosiland Oz,  MD Infectious [facility] for Infectious Diseases   To contact the attending provider between 8:30A-5P or the covering provider during after hours 5P-8:30A, please log into the web site [email or web address] and access using universal [place] password for that web site. If you do not have the password, please call the hospital operator. __________________________________________________________________________________________________________ HPI and Hospital Course: 56 Y O female with a PMH of multiple myeloma (initially diagnosed in 2013, s/p autologous BMT  In 02/2013 at [facility], on chemotherapy with Cytoxan, Kyprolis, Dexa, last dose on 8/5, follows with Dr. Burr Medico), diastolic heart failure, CAD, pancytopenia, osteoporosis who presented to the ED yesterday with complaints of fatigue. Patient was recently admitted to [facility] on 7/15 for left lower extremity cellulitis ( treated with IV vanc and PICC line) and developed right leg edema on 7/30 with a negative Doppler. She was put on Lasix for swelling which initially improved but has had increasing pain and swelling of the RLE over the past few days as well as fatigue.  Pain is unrelieved byhome morphine and has noted some weeping from her extremity.  Denies h/o fever or chills, no abdominal pain, no chest pain, shortness of breath, nausea or vomiting.    At ED, patient  was hemodynamically stable on room air. Work up remarkable for BNP 2409, lactic acid 1.7, WBC 7.4, Hb 8.8, platelets 131, COVID-19 negative.  Bilateral lower extremity ultrasound: Negative for DVT but suggestive of possible elevated right heart pressure.  Received IVF and IV abx. ID consulted for Pseudomonas bacteremia.   Past Medical History:  Diagnosis Date  . CAD (coronary artery disease)    a. inf STEMI (in [place] >>> [facility]) >>> LHC (8/15- [place]):  pRCA 60%, mid to dist RCA 50% >>> med rx  . Cardiomyopathy ([place])   . Cataract   . Chronic combined  systolic and diastolic CHF (congestive heart failure) ([place])    cancer medication related   . Cord compression ([place]) 07/13/12   MRI- diffuse myeloma involvement of T-L spine  . History of radiation therapy 07/13/12-07/27/12   spinal cord compression T3-T10,left scapula  . Infection 01/29/2020   left leg infection- treated at [facility]  . 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 Multiple myeloma ([place]) 07/01/2012  . MVP (mitral valve prolapse)    a. trivial by echo 06/2017.  . OSTEOPOROSIS 06/11/2010   Multiple compression fractures; and spontaneous fracture of sternum Qualifier: Diagnosis of  By: [name], [name]     . Thoracic kyphosis 07/13/12   per MRI scan  . Unspecified deficiency anemia    Past Surgical History:  Procedure Laterality Date  . APPENDECTOMY    . CATARACT EXTRACTION, BILATERAL    . CESAREAN SECTION     x2   . COLONOSCOPY  2007   neg with Dr. [name]  . ELBOW SURGERY    . HIP SURGERY  2009   left  . IR IMAGING GUIDED PORT INSERTION  03/07/2020  . IR REMOVAL TUN CV CATH W/O FL  02/15/2020  . TUBAL LIGATION      Scheduled Meds: . aspirin EC  81 mg Oral Daily  . atorvastatin  20 mg Oral Daily  . calcium-vitamin D  1 tablet Oral Q breakfast  . enoxaparin (LOVENOX) injection  40 mg Subcutaneous Q24H  . furosemide  20 mg Oral Daily  . metoprolol succinate  25 mg Oral Daily   Continuous Infusions: . acyclovir 400 mg (03/16/20 0010)  . ceFEPime (MAXIPIME) IV 2 g (03/16/20 0829)   PRN Meds:.acetaminophen, metoprolol tartrate, ondansetron, prochlorperazine  Allergies  Allergen Reactions  . Zithromax [Azithromycin] Rash    Full body rash  . Zosyn [Piperacillin Sod-Tazobactam So] Swelling and Rash    Itching, rash and swelling of extremities Tolerates Rocephin  . Quinolones Rash    Cipro, Levaquin implicated Cipro also caused vomiting and abd pain    Social History   Socioeconomic History  . Marital status: Married    Spouse name: Fritz Pickerel  . Number of children: 2   . Years of education: Not on file  . Highest education level: Doctorate  Occupational History  . Occupation: Museum/gallery conservator: [employer]  Tobacco Use  . Smoking status: Never Smoker  . Smokeless tobacco: Never Used  Vaping Use  . Vaping Use: Never used  Substance and Sexual Activity  . Alcohol use: Yes    Alcohol/week: 7.0 standard drinks    Types: 7 Standard drinks or equivalent per week    Comment: 1 glass of wine or beer 3-5 days per week  . Drug use: No  . Sexual activity: Yes    Birth control/protection: Post-menopausal  Other Topics Concern  . Not on file  Social History Narrative   Who lives with you: spouse, 2 story home, does not have problems with the stairs, has walk in shower, handrails.   Any pets: Dog, Brayden, black lab/boxer mix   Diet: Patient has a varied diet of protein, vegetables, starches.  Limits red meats.   Exercise: Patient exercises at least 3 times a week for over an hour.   Seatbelts: Patient reports wearing her seatbelt when in vehicle.    Nancy Fetter Exposure/Protection: Patient reports wearing sunscreen daily.   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 Biked 5 miles this morning to this appointment.   Social Determinants of Health   Financial Resource Strain:   . Difficulty of Paying Living Expenses: Not on file  Food Insecurity:   . Worried About Sales executive in the Last Year: Not on file  . Ran Out of Food in the Last Year: Not on file  Transportation Needs:   . Lack of Transportation (Medical): Not on file  . Lack of Transportation (Non-Medical): Not on file  Physical Activity:   . Days of Exercise per Week: Not on file  . Minutes of Exercise per Session: Not on file  Stress:   . Feeling of Stress : Not on file  Social Connections:   . Frequency of Communication with Friends and Family: Not on file  . Frequency of Social Gatherings with Friends and Family: Not on file  . Attends Religious Services: Not on file  . Active Member of Clubs or Organizations: Not on file  . Attends Archivist Meetings: Not on file  . Marital Status: Not on file  Intimate Partner Violence:   . Fear of Current or Ex-Partner: Not on file  . Emotionally Abused: Not on file  . Physically Abused: Not on file  . Sexually Abused: Not on file   Vitals  BP (!) 83/47   Pulse 78   Temp 97.9 F (36.6 C) (Oral)   Resp 19   Ht 5' 6"  (1.676 m)   Wt 51.9 kg   SpO2 93%   BMI 18.47 kg/m    Examination  Gen: Alert and oriented x 3, not in acute distress, very thin and cachectic  HEENT: [place]/AT, PERLA, no scleral icterus, pale conjunctivae, hearing normal, mucosa moist Neck: Supple, no lymphadenopathy Cardio: RRR, +S1 and S2. Soft systolic murmur Resp: CTAB; no wheezes, rhonchi, or rales GI: Soft, nontender, nondistended, bowel sounds + Musc: No  joint swelling or tenderness Extremities: No cyanosis, clubbing, or edema; palpable PT and DP pulses     Neuro: grossly nonfocal, kyphosis Psych: Calm, cooperative  LINES/TUBES:  METAL IMPLANT/HARDWARE:  Microbiology Results for orders placed or performed during the hospital encounter of 03/15/20  Aerobic Culture (superficial specimen)     Status: None (Preliminary result)   Collection Time: 03/15/20 10:21 AM   Specimen: Leg  Result Value Ref Range Status   Specimen Description   Final    LEG  RIGHT Performed at [facility] [address], [place], [place] [address]    Special Requests   Final    Immunocompromised Performed at [facility], [place] [address] [place], [place] [address]    Gram Stain   Final    NO WBC SEEN MODERATE GRAM VARIABLE ROD FEW GRAM POSITIVE COCCI FEW GRAM NEGATIVE RODS Performed at [facility], [place] [address], [place], [place] [address]    Culture PENDING  Incomplete   Report Status PENDING  Incomplete  SARS Coronavirus 2 by RT PCR (hospital order, performed in [facility] hospital lab) Nasopharyngeal Nasopharyngeal Swab     Status: None   Collection Time: 03/15/20 10:26 AM   Specimen: Nasopharyngeal Swab  Result Value Ref Range Status   SARS Coronavirus 2 NEGATIVE NEGATIVE Final    Comment: (NOTE) SARS-CoV-2 target nucleic acids are NOT DETECTED.  The SARS-CoV-2 RNA is generally detectable in upper and lower respiratory specimens during the acute phase of infection. The lowest concentration of SARS-CoV-2 viral copies this assay can detect is 250 copies / mL. A negative result does not preclude SARS-CoV-2 infection and should not be used as the sole basis for treatment or other patient management decisions.  A negative result may occur with improper specimen collection / handling, submission of specimen other than nasopharyngeal swab, presence of viral mutation(s) within the areas targeted by this assay, and inadequate number of viral copies (<250 copies / mL). A negative result must be combined with clinical observations, patient history, and epidemiological information.  Fact Sheet for Patients:   StrictlyIdeas.no  Fact Sheet for Healthcare Providers: BankingDealers.co.za  This test is not yet approved or  cleared by the [place] FDA and has been authorized for detection and/or diagnosis of SARS-CoV-2 by FDA under an Emergency Use  Authorization (EUA).  This EUA will remain in effect (meaning this test can be used) for the duration of the COVID-19 declaration under Section 564(b)(1) of the Act, 21 U.S.C. section 360bbb-3(b)(1), unless the authorization is terminated or revoked sooner.  Performed at [facility], [place] [address] [place], [place] [address]   Culture, blood (routine x 2)     Status: None (Preliminary result)   Collection Time: 03/15/20 11:10 AM   Specimen: BLOOD  Result Value Ref Range Status   Specimen Description   Final    BLOOD RIGHT ANTECUBITAL Performed at [facility] [address], [place], [place] [address]    Special Requests   Final    BOTTLES DRAWN AEROBIC AND ANAEROBIC Blood Culture adequate volume Performed at [facility] [address], [place], [place] [address]    Culture  Setup Time   Final    IN BOTH AEROBIC AND ANAEROBIC BOTTLES GRAM NEGATIVE RODS Organism ID to follow CRITICAL RESULT CALLED TO, READ BACK BY AND VERIFIED WITH: Sheffield Slider [facility] 03/16/20 0600 JDW Performed at [facility], [address], [place], [place]  [address]    Culture GRAM NEGATIVE RODS  Final   Report Status PENDING  Incomplete  Blood Culture ID Panel (Reflexed)     Status: Abnormal   Collection Time: 03/15/20 11:10 AM  Result Value Ref Range Status   Enterococcus faecalis NOT DETECTED NOT DETECTED Final   Enterococcus Faecium NOT DETECTED NOT DETECTED Final   Listeria monocytogenes NOT DETECTED NOT DETECTED Final   Staphylococcus species NOT DETECTED NOT DETECTED Final   Staphylococcus aureus (BCID) NOT DETECTED NOT DETECTED Final   Staphylococcus epidermidis NOT DETECTED NOT DETECTED Final   Staphylococcus lugdunensis NOT DETECTED NOT DETECTED Final   Streptococcus species NOT DETECTED NOT DETECTED Final   Streptococcus agalactiae NOT DETECTED NOT DETECTED Final   Streptococcus pneumoniae NOT DETECTED NOT DETECTED Final    Streptococcus pyogenes NOT DETECTED NOT DETECTED Final   A.calcoaceticus-baumannii NOT DETECTED NOT DETECTED Final   Bacteroides fragilis NOT DETECTED NOT DETECTED Final   Enterobacterales NOT DETECTED NOT DETECTED Final   Enterobacter cloacae complex NOT DETECTED NOT DETECTED Final   Escherichia coli NOT DETECTED NOT DETECTED Final   Klebsiella aerogenes NOT DETECTED NOT DETECTED Final   Klebsiella oxytoca NOT DETECTED NOT DETECTED Final   Klebsiella pneumoniae NOT DETECTED NOT DETECTED Final   Proteus species NOT DETECTED NOT DETECTED Final   Salmonella species NOT DETECTED NOT DETECTED Final   Serratia marcescens NOT DETECTED NOT DETECTED Final   Haemophilus influenzae NOT DETECTED NOT DETECTED Final   Neisseria meningitidis NOT DETECTED NOT DETECTED Final   Pseudomonas aeruginosa DETECTED (A) NOT DETECTED Final    Comment: CRITICAL RESULT CALLED TO, READ BACK BY AND VERIFIED WITH: Sheffield Slider PHARMD 03/16/20 0600 JDW    Stenotrophomonas maltophilia NOT DETECTED NOT DETECTED Final   Candida albicans NOT DETECTED NOT DETECTED Final   Candida auris NOT DETECTED NOT DETECTED Final   Candida glabrata NOT DETECTED NOT DETECTED Final   Candida krusei NOT DETECTED NOT DETECTED Final   Candida parapsilosis NOT DETECTED NOT DETECTED Final   Candida tropicalis NOT DETECTED NOT DETECTED Final   Cryptococcus neoformans/gattii NOT DETECTED NOT DETECTED Final   CTX-M ESBL NOT DETECTED NOT DETECTED Final   Carbapenem resistance IMP NOT DETECTED NOT DETECTED Final   Carbapenem resistance KPC NOT DETECTED NOT DETECTED Final   Carbapenem resistance NDM NOT DETECTED NOT DETECTED Final   Carbapenem resistance VIM NOT DETECTED NOT DETECTED Final    Comment: Performed at [facility], [address] [place], [place] [address]   *Note: Due to a large number of results and/or encounters for the requested time period, some results have not been displayed. A complete set of results can be found in  Results Review.     Pertinent Lab seen by me: CBC Latest Ref Rng & Units 03/16/2020 03/15/2020 03/09/2020  WBC 4.0 - 10.5 K/uL 2.3(L) 7.4 3.3(L)  Hemoglobin 12.0 - 15.0 g/dL 8.1(L) 8.8(L) 7.3(L)  Hematocrit 36 - 46 % 24.1(L) 26.9(L) 22.6(L)  Platelets 150 - 400 K/uL 102(L) 131(L) 55(L)   CMP Latest Ref Rng & Units 03/15/2020 03/09/2020 03/02/2020  Glucose 70 - 99 mg/dL 121(H) 128(H) 98  BUN 8 - 23 mg/dL 23 24(H) 20  Creatinine 0.44 - 1.00 mg/dL 0.80 0.85 0.85  Sodium 135 - 145 mmol/L 138 138 137  Potassium 3.5 - 5.1 mmol/L 3.8 4.1 4.0  Chloride 98 - 111 mmol/L 108 109 108  CO2 22 - 32 mmol/L 19(L) 24 22  Calcium 8.9 - 10.3 mg/dL 9.1 9.5 9.5  Total Protein 6.5 - 8.1 g/dL - 6.0(L) 6.3(L)  Total Bilirubin 0.3 - 1.2 mg/dL - 1.2 1.2  Alkaline Phos 38 - 126 U/L - 95 110  AST 15 - 41 U/L - 20 29  ALT 0 - 44 U/L - 33 28    Pertinent Imagings/Other Imagings Plain films and CT images have been personally visualized and interpreted; radiology reports have been reviewed. Decision making incorporated into the Impression / Recommendations.  Xray Tibia/Fibula 03/15/20  FINDINGS: Nonspecific subcutaneous reticulation. No evidence of fracture. There is generalized osteopenia without visible myeloma in the covered skeleton.  IMPRESSION: Generalized soft tissue swelling without acute or malignant osseous finding.  Chest Xray 03/15/20  FINDINGS: Patchy bilateral airspace disease. Cardiomegaly and vascular pedicle widening. There is a small right pleural effusion. No pneumothorax.  Porta catheter with tip at the right atrium. Remote spinal, rib, and left clavicle fracture deformities. There is history of multiple myeloma with diffuse demineralization.  IMPRESSION: Bilateral airspace disease that could be infection or failure. Small right pleural effusion interval cardiomegaly favors failure.

## 2020-03-16 NOTE — Progress Notes (Signed)
SLP Cancellation Note  Patient Details Name: Shannon Rush MRN: [id number] DOB: 1941/07/02   Cancelled treatment:       Reason Eval/Treat Not Completed:  (note plans for family to come in to see pt from out of town)  Pt stating she is "ready".  Per RN, they are swabbing pt's oral cavity for comfort.  Will sign off, given care plan. Please reorder if desired.   Kathleen Lime, MS [facility] SLP Acute Rehab Services Office [phone]  Macario Golds 03/16/2020, 8:48 AM

## 2020-03-16 NOTE — Progress Notes (Signed)
I talked to daughter [name] (OB/GYN)and she says she has never seen her mother look the way she did on facetime, which is the same way patient's brother looked before he died. Other daughter's flight is scheduled to arrive around 3pm tomorrow. Please contact daughter [name], she says anytime is fine. They are in [facility] and 2 hours behind. She agrees to pressors if needed.  Pt reports she is "ready to go" and also agreed to pressors to allow her to see her family before she passes.

## 2020-03-16 NOTE — Progress Notes (Signed)
Echocardiogram 2D Echocardiogram has been performed.  Oneal Deputy Jaydrien Wassenaar 03/16/2020, 8:32 AM

## 2020-03-16 NOTE — Evaluation (Signed)
Physical Therapy Evaluation Patient Details Name: Shannon Rush MRN: [id number] DOB: 06-24-1941 Today's Date: 03/16/2020   History of Present Illness  Pt admitted with R LE cellulitis, acute metabolic encephalopathy and acute diastolic CHF.  Pt with hx of multiple myeloma, CHF, CAD, MI and cardiomyopathy.  `  Clinical Impression  Pt admitted as above and presenting with functional mobility limitations 2* generalized weakness, ambulatory balance deficits and limited endurance. Pt hopes to progress to dc home with assist of spouse and would benefit from follow up HHPT to further address deficits.    Follow Up Recommendations Home health PT    Equipment Recommendations  None recommended by PT    Recommendations for Other Services       Precautions / Restrictions Precautions Precautions: Fall Restrictions Weight Bearing Restrictions: No      Mobility  Bed Mobility Overal bed mobility: Needs Assistance Bed Mobility: Supine to Sit;Sit to Supine     Supine to sit: Min assist Sit to supine: Min assist;Mod assist   General bed mobility comments: Increased time with assist to bring trunk to upright and to bring LEs into bed  Transfers Overall transfer level: Needs assistance Equipment used: Rolling walker (2 wheeled) Transfers: Sit to/from Stand Sit to Stand: Min assist         General transfer comment: cues for use of UEs to self assist  Ambulation/Gait Ambulation/Gait assistance: Min assist;+2 physical assistance;+2 safety/equipment Gait Distance (Feet): 120 Feet Assistive device: Rolling walker (2 wheeled) Gait Pattern/deviations: Step-through pattern;Decreased step length - right;Decreased step length - left;Shuffle;Trunk flexed Gait velocity: decr   General Gait Details: Increased time with cues for posture and position from RW.  Stairs            Wheelchair Mobility    Modified Rankin (Stroke Patients Only)       Balance Overall balance assessment:  Needs assistance Sitting-balance support: Feet unsupported;No upper extremity supported Sitting balance-Leahy Scale: Fair     Standing balance support: Bilateral upper extremity supported Standing balance-Leahy Scale: Poor                               Pertinent Vitals/Pain Pain Assessment: No/denies pain    Home Living Family/patient expects to be discharged to:: Private residence Living Arrangements: Spouse/significant other Available Help at Discharge: Family Type of Home: House Home Access: Stairs to enter Entrance Stairs-Rails: Right;Left;Can reach both Technical brewer of Steps: 5 Home Layout: Able to live on main level with bedroom/bathroom Home Equipment: Walker - 4 wheels      Prior Function Level of Independence: Independent         Comments: very active until recently     Hand Dominance        Extremity/Trunk Assessment   Upper Extremity Assessment Upper Extremity Assessment: Generalized weakness    Lower Extremity Assessment Lower Extremity Assessment: Generalized weakness       Communication   Communication: No difficulties  Cognition Arousal/Alertness: Awake/alert Behavior During Therapy: WFL for tasks assessed/performed Overall Cognitive Status: Within Functional Limits for tasks assessed                                        General Comments      Exercises     Assessment/Plan    PT Assessment Patient needs continued PT services  PT Problem List  Decreased strength;Decreased activity tolerance;Decreased balance;Decreased mobility;Decreased knowledge of use of DME       PT Treatment Interventions DME instruction;Gait training;Stair training;Functional mobility training;Therapeutic activities;Therapeutic exercise;Patient/family education;Balance training    PT Goals (Current goals can be found in the Care Plan section)  Acute Rehab PT Goals Patient Stated Goal: Regain IND PT Goal Formulation:  With patient Time For Goal Achievement: 03/30/20 Potential to Achieve Goals: Good    Frequency Min 3X/week   Barriers to discharge        Co-evaluation               AM-PAC PT "6 Clicks" Mobility  Outcome Measure Help needed turning from your back to your side while in a flat bed without using bedrails?: A Little Help needed moving from lying on your back to sitting on the side of a flat bed without using bedrails?: A Little Help needed moving to and from a bed to a chair (including a wheelchair)?: A Little Help needed standing up from a chair using your arms (e.g., wheelchair or bedside chair)?: A Little Help needed to walk in hospital room?: A Little Help needed climbing 3-5 steps with a railing? : A Lot 6 Click Score: 17    End of Session Equipment Utilized During Treatment: Gait belt Activity Tolerance: Patient tolerated treatment well Patient left: in bed;with call bell/phone within reach;with family/visitor present Nurse Communication: Mobility status PT Visit Diagnosis: Unsteadiness on feet (R26.81);Muscle weakness (generalized) (M62.81);Difficulty in walking, not elsewhere classified (R26.2)    Time: 1345-1420 PT Time Calculation (min) (ACUTE ONLY): 35 min   Charges:   PT Evaluation $PT Eval Low Complexity: 1 Low PT Treatments $Gait Training: 8-22 mins        Debe Coder PT Acute Rehabilitation Services Pager [phone] Office [phone]   Elajah Kunsman 03/16/2020, 4:10 PM

## 2020-03-17 ENCOUNTER — Other Ambulatory Visit (HOSPITAL_COMMUNITY): Payer: Medicare Other

## 2020-03-17 ENCOUNTER — Inpatient Hospital Stay (HOSPITAL_COMMUNITY): Payer: Medicare Other

## 2020-03-17 ENCOUNTER — Other Ambulatory Visit: Payer: Self-pay

## 2020-03-17 DIAGNOSIS — J90 Pleural effusion, not elsewhere classified: Secondary | ICD-10-CM

## 2020-03-17 DIAGNOSIS — I5031 Acute diastolic (congestive) heart failure: Secondary | ICD-10-CM

## 2020-03-17 DIAGNOSIS — C9000 Multiple myeloma not having achieved remission: Secondary | ICD-10-CM

## 2020-03-17 LAB — LACTATE DEHYDROGENASE, PLEURAL OR PERITONEAL FLUID: LD, Fluid: 164 U/L — ABNORMAL HIGH (ref 3–23)

## 2020-03-17 LAB — CBC
HCT: 23.8 % — ABNORMAL LOW (ref 36.0–46.0)
Hemoglobin: 7.8 g/dL — ABNORMAL LOW (ref 12.0–15.0)
MCH: 34.7 pg — ABNORMAL HIGH (ref 26.0–34.0)
MCHC: 32.8 g/dL (ref 30.0–36.0)
MCV: 105.8 fL — ABNORMAL HIGH (ref 80.0–100.0)
Platelets: 98 10*3/uL — ABNORMAL LOW (ref 150–400)
RBC: 2.25 MIL/uL — ABNORMAL LOW (ref 3.87–5.11)
RDW: 20.1 % — ABNORMAL HIGH (ref 11.5–15.5)
WBC: 4.2 10*3/uL (ref 4.0–10.5)
nRBC: 0 % (ref 0.0–0.2)

## 2020-03-17 LAB — AEROBIC CULTURE W GRAM STAIN (SUPERFICIAL SPECIMEN): Gram Stain: NONE SEEN

## 2020-03-17 LAB — PROTEIN, TOTAL: Total Protein: 6 g/dL — ABNORMAL LOW (ref 6.5–8.1)

## 2020-03-17 LAB — BASIC METABOLIC PANEL
Anion gap: 12 (ref 5–15)
BUN: 38 mg/dL — ABNORMAL HIGH (ref 8–23)
CO2: 19 mmol/L — ABNORMAL LOW (ref 22–32)
Calcium: 8.4 mg/dL — ABNORMAL LOW (ref 8.9–10.3)
Chloride: 106 mmol/L (ref 98–111)
Creatinine, Ser: 1.05 mg/dL — ABNORMAL HIGH (ref 0.44–1.00)
GFR calc Af Amer: 59 mL/min — ABNORMAL LOW (ref >60)
GFR calc non Af Amer: 51 mL/min — ABNORMAL LOW (ref >60)
Glucose, Bld: 180 mg/dL — ABNORMAL HIGH (ref 70–99)
Potassium: 3.3 mmol/L — ABNORMAL LOW (ref 3.5–5.1)
Sodium: 137 mmol/L (ref 135–145)

## 2020-03-17 LAB — BODY FLUID CELL COUNT WITH DIFFERENTIAL
Lymphs, Fluid: 29 %
Monocyte-Macrophage-Serous Fluid: 65 % (ref 50–90)
Neutrophil Count, Fluid: 6 % (ref 0–25)
Total Nucleated Cell Count, Fluid: 130 cu mm (ref 0–1000)

## 2020-03-17 LAB — LACTATE DEHYDROGENASE: LDH: 413 U/L — ABNORMAL HIGH (ref 98–192)

## 2020-03-17 LAB — PROTEIN, PLEURAL OR PERITONEAL FLUID: Total protein, fluid: <3 g/dL

## 2020-03-17 LAB — MAGNESIUM: Magnesium: 2.2 mg/dL (ref 1.7–2.4)

## 2020-03-17 LAB — CHOLESTEROL, TOTAL: Cholesterol: 87 mg/dL (ref 0–200)

## 2020-03-17 LAB — LACTIC ACID, PLASMA: Lactic Acid, Venous: 1.5 mmol/L (ref 0.5–1.9)

## 2020-03-17 MED ORDER — POTASSIUM CHLORIDE CRYS ER 20 MEQ PO TBCR
40.0000 meq | EXTENDED_RELEASE_TABLET | Freq: Once | ORAL | Status: AC
  Administered 2020-03-17: 40 meq via ORAL
  Filled 2020-03-17: qty 2

## 2020-03-17 NOTE — Evaluation (Signed)
Clinical/Bedside Swallow Evaluation Patient Details  Name: Shannon Rush MRN: [id number] Date of Birth: 08/01/1940  Today's Date: 03/17/2020 Time: SLP Start Time (ACUTE ONLY): 1500 SLP Stop Time (ACUTE ONLY): 1520 SLP Time Calculation (min) (ACUTE ONLY): 20 min  Past Medical History:  Past Medical History:  Diagnosis Date  . CAD (coronary artery disease)    a. inf STEMI (in [place] >>> [facility]) >>> LHC (8/15- [place]):  pRCA 60%, mid to dist RCA 50% >>> med rx  . Cardiomyopathy ([place])   . Cataract   . Chronic combined systolic and diastolic CHF (congestive heart failure) ([place])    cancer medication related   . Cord compression ([facility]) 07/13/12   MRI- diffuse myeloma involvement of T-L spine  . History of radiation therapy 07/13/12-07/27/12   spinal cord compression T3-T10,left scapula  . Infection 01/29/2020   left leg infection- [facility] at [facility]  . 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 Multiple myeloma ([place]) 07/01/2012  . MVP (mitral valve prolapse)    a. trivial by echo 06/2017.  . OSTEOPOROSIS 06/11/2010   Multiple compression fractures; and spontaneous fracture of sternum Qualifier: Diagnosis of  By: [name], [name]     . Thoracic kyphosis 07/13/12   per MRI scan  . Unspecified deficiency anemia    Past Surgical History:  Past Surgical History:  Procedure Laterality Date  . APPENDECTOMY    . CATARACT EXTRACTION, BILATERAL    . CESAREAN SECTION     x2   . COLONOSCOPY  2007   neg with Dr. [name]  . ELBOW SURGERY    . HIP SURGERY  2009   left  . IR IMAGING GUIDED PORT INSERTION  03/07/2020  . IR REMOVAL TUN CV CATH W/O FL  02/15/2020  . TUBAL LIGATION     HPI:  79yo female admitted 03/15/2020 with LLE pain, weeping wound. PMH: multiple myeloma, cellulitis, CAD, cardiomyopathy, CHF, MVP, osteoporosis. CXR = Mild bibasilar subsegmental atelectasis with small left pleural effusion.   Assessment / Plan / Recommendation Clinical Impression  Pt seen at  bedside for evaluation of swallow function and safety. Daughter was assisting pt with eating her lunch upon arrival of SLP. Pt/dtr report extended time required for chewing solids. Moistened solids are tolerated better. Pt exhibits cough response following thin liquids, and nectar thick liquid via straw. No cough noted following small individual sips of nectar thick liquid via cup. Given generalized weakness, will downgrade diet to dys 2 (finely chopped) with nectar thick liquids for energy conservation and to minimize aspiration risk. Safe swallow precautions posted at [facility]. ST will follow for assessment of diet tolerance, and continued education.   SLP Visit Diagnosis: Dysphagia, unspecified (R13.10)    Aspiration Risk  Moderate aspiration risk;Risk for inadequate nutrition/hydration    Diet Recommendation Dysphagia 2 (Fine chop);Nectar-thick liquid   Liquid Administration via: Cup;No straw Medication Administration: Crushed with puree Supervision: Staff to assist with self feeding;Full supervision/cueing for compensatory strategies Compensations: Minimize environmental distractions;Small sips/bites;Slow rate Postural Changes: Seated upright at 90 degrees;Remain upright for at least 30 minutes after po intake    Other  Recommendations Oral Care Recommendations: Oral care BID Other Recommendations: Order thickener from pharmacy   Follow up Recommendations Other (comment) (TBD)      Frequency and Duration min 1 x/week  1 week;2 weeks       Prognosis Prognosis for Safe Diet Advancement: Fair      Swallow Study   General Date of Onset:  03/15/20 HPI: 79yo female admitted 03/15/2020 with LLE pain, weeping wound. PMH: multiple myeloma, cellulitis, CAD, cardiomyopathy, CHF, MVP, osteoporosis. CXR = Mild bibasilar subsegmental atelectasis with small left pleural effusion. Type of Study: Bedside Swallow Evaluation Previous Swallow Assessment: none Diet Prior to this Study: Regular;Thin  liquids Temperature Spikes Noted: No Respiratory Status: Room air History of Recent Intubation: No Behavior/Cognition: Alert;Cooperative;Pleasant mood Oral Cavity Assessment: Within Functional Limits Oral Care Completed by SLP: No Oral Cavity - Dentition: Adequate natural dentition Vision: Functional for self-feeding Self-Feeding Abilities: Able to feed self;Needs assist Patient Positioning: Upright in bed Baseline Vocal Quality: Normal Volitional Cough: Weak Volitional Swallow: Able to elicit    Oral/Motor/Sensory Function Overall Oral Motor/Sensory Function: Generalized oral weakness   Thin Liquid Thin Liquid: Impaired Presentation: Straw;Cup Pharyngeal  Phase Impairments: Cough - Immediate    Nectar Thick Presentation: Straw;Cup Other Comments: cough with straw   Puree Puree: Within functional limits Presentation: Spoon   Solid     Solid: Impaired Presentation: Spoon Oral Phase Impairments: Impaired mastication Oral Phase Functional Implications: Prolonged oral transit;Impaired mastication     Brynlee Pennywell B. Quentin Ore, [facility], [place] Speech Language Pathologist Office: [phone] Pager: [phone]  Shonna Chock 03/17/2020,3:28 PM

## 2020-03-17 NOTE — Progress Notes (Signed)
Patient transferred to step-down ICU from ED. After giving bath and getting patient situated, she had an 8 beat run of SVT asymptomatic. Notified Dr. Hal Hope. Will continue to monitor.

## 2020-03-17 NOTE — Progress Notes (Addendum)
HEMATOLOGY-ONCOLOGY PROGRESS NOTE  SUBJECTIVE: The patient has no complaints today.  Still has coughing after eating and drinking.  Afebrile.  Remains hypotensive at times.  Oncology History Overview Note  Multiple myeloma [facility])   Staging form: Multiple Myeloma, AJCC 6th Edition   - Clinical: Stage IIIA - Signed by [name], MD on 09/04/2013    Multiple myeloma ([place])  05/26/2012 Initial Diagnosis   Presenting IgG was 4,040 mg/dL on 06/05/2012 (IgA 35; IgM 34); kappa free light chain 34.4 mg/dL, lambda 0.00, kappa:lambda ratio of 34.75; SPEP with M-spike of 2.60.   06/30/2012 Imaging   Numerous lytic lesions throughout the calvarium.  Lytic lesion within the left lateral scapula.  Findings most compatible with metastases or myeloma. Multiple mid and lower thoracic compression fractures.  Slight compression through the endplates at L3.   47/42/5956 Bone Marrow Biopsy   Bone marrow biopsy showed 49% plasma cell. Normal classical cytogenetics; however, myeloma FISH panel showed 13q- (intermediate risk)       07/14/2012 - 07/27/2012 Radiation Therapy   Palliative radiation 20 Gy over 10 fractions between to thoracic spine cord compression and symptomatic left scapula lytic lesion.   08/10/2012 - 11/2012 Chemotherapy   Started SQ Velcade once weekly, 3 weeks on, 1 weeks off; daily Revlimid d1-21, 7 days off; and Dexamethasone 70m PO weekly.    02/12/2013 Bone Marrow Transplant   [facility]    06/14/2013 Tumor Marker   IgG  1830,  Kappa:lamba ratio, 1.69 (baseline was 34.75 or   M-spike 0.28 (baseline of 2.6 or  89.3% of baseline).     06/25/2013 -  Chemotherapy   Started zometa 3.5 mg monthly    09/01/2013 - 01/2014 Chemotherapy   Maintenance therapy with revlimid 522mdaily for 14 days and then off for 7 (decreased from 21 days on and 7 days off based on neutropenia). Stopped due to disease progression 01/2017   09/13/2013 - 09/17/2014 Hospital Admission   Hospital  admission for pneumonia   12/20/2013 Treatment Plan Change   Maintenance therapy decreased to 2.5 mg daily for 21 days and then off for 7 days based on low counts.    01/25/2014 Tumor Marker   IgG 1360 M spike 0.5   03/01/2014 - 03/05/2014 Hospital Admission   [facility], received thrombolytic therapy, cath showed 60% prox RCA, mid-distal RCA 50%, Echo normal, no stents.   04/11/2014 - 08/07/2014 Chemotherapy   Continue Revlimid at 2.5 mg 3 weeks on 1 week off   08/08/2014 - 08/13/2014 Hospital Admission   Hospital admission for pneumonia. Her Revlimid was held.   08/29/2014 - 09/14/2014 Chemotherapy   Maintenance Revlimid restarted   09/14/2014 - 09/17/2014 Hospital Admission   [place].   11/13/2014 - 12/25/2015 Chemotherapy   Maintenance Revlimid restarted, changed to 2.41m23maily, 2 weks on, 1 week off from 12/15/2014, stopped due to disease progression    01/24/2016 Progression   Patient is M protein has gradually increased to 1.0g, repeated a bone marrow biopsy showed plasma cell 10-30%   02/27/2016 - 03/07/2016 Chemotherapy   Pomalidomide 4 mg daily on day 1-21, dexamethasone 40 mg on day 1, 8 and 15, every 28 days, started on 02/27/2016, daratumumab weekly started on 03/07/2016, held after first dose, due to hospitalization and a severe pancytopenia, pomalidomide was stopped afterwards    03/10/2016 -  03/29/2016 Hospital Admission   Pt was admitted for sepsis from pneumonia, and severe pancytopenia. She required ICU stay for a few days due to hypotension, developed b/l pleural effusion required diuretics and b/l thoracentesis. She also required blood and plt transfusion, and prolonged neupogen injection for severe neutropenia. She was discharged home after 3 weeks hospital stay    04/26/2016 - 04/26/2019 Chemotherapy   Daratumumab restarted on 04/26/2016, Velcade  1.62m/m2 and dexa 218mweekly start on 11/3, velcade held since 06/28/2016 due to CHF, dexa stopped on12/11/2017 due to high risk of fracture.  Dara reduced to every 4 weeks due to COVID-19 starting 10/23/18. Returned to every 3 weeks starting 12/17/18. Restarted every 2 weeks starting 02/22/19 Stopped on 04/26/19 due to disease progression.    08/26/2016 Echocardiogram   - Left ventricle: LVEF is approximately 35% with diffuse diffuse   hypokinesis with severe hypokinesis/ akinesis of the   inferior/inferoseptal walls. In comparison to echo images from   December 2017, there does not appear to be significant change The   cavity size was normal. Wall thickness was normal. Doppler   parameters are consistent with abnormal left ventricular   relaxation (grade 1 diastolic dysfunction). - Aortic valve: AV is thickened. 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 Present in echo from December 2017 There was   trivial regurgitation. - Right ventricle: Systolic function was mildly reduced. - Right atrium: The atrium was mildly dilated. - Pericardium, extracardiac: A trivial pericardial effusion was   identified.   12/03/2016 Echocardiogram   -Left Ventricle: Systolic function was   mildly to moderately reduced. The estimated ejection fraction was   in the range of 40% to 45%. Diffuse hypokinesis. Doppler   parameters are consistent with abnormal left ventricular   relaxation (grade 1 diastolic dysfunction). Doppler parameters   are consistent with indeterminate ventricular filling pressure. - Left atrium: The atrium was severely dilated. - Tricuspid valve: There was trivial regurgitation. - Pulmonary arteries: Systolic pressure was within the normal   range. PA peak pressure: 33 mm Hg (S). - Global longitudinal strain -10.5% (abnormal)   03/12/2017 Echocardiogram   ECHO 03/12/17 Study Conclusions - Left ventricle: The cavity size was  normal. Wall thickness was   increased in a pattern of mild LVH. Systolic function was normal.   The estimated ejection fraction was in the range of 50% to 55%.   There is hypokinesis of the basalinferior myocardium. Doppler   parameters are consistent with abnormal left ventricular   relaxation (grade 1 diastolic dysfunction). - Aortic valve: There was trivial regurgitation. - Mitral valve: Calcified annulus. Mildly thickened leaflets .   There was trivial regurgitation. - Tricuspid valve: There was mild regurgitation. Impressions: - There has been mild improvement in EF since prior study.    12/12/2017 - 12/15/2017 Hospital Admission   Admission diagnosis: Pnuemonia and acute hypoxic respiratory failure Additional comments: treated for pneumoania and acute hypoxic respiratory failure before she was discharged on 12/15/17 with oral anitbiotics. She did not require home oxygen.    12/30/2017 - 01/02/2018 Hospital Admission   Admission diagnosis: Pnuemonia  Additional comments: She was again hospitalized on 12/30/17 for pneumonia, ID work up was otherwise negative, she was discharged home with oral antibiotics.    04/01/2018 Imaging   04/01/2018 CXR IMPRESSION: Persisting airspace disease superimposed on chronic changes, suggesting pneumonia given the history. Followup PA and lateral chest X-ray is recommended in 3-4 weeks following therapy to  ensure resolution and exclude underlying malignancy.  Redemonstration of mid and lower thoracic compression fractures with associated thoracic kyphosis.   04/23/2018 Echocardiogram   04/23/2018 ECHO LV EF: 45% -   50%   01/07/2019 Imaging   MRI Brain 01/07/19 IMPRESSION: MRI HEAD IMPRESSION:   1. 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 Lesion closely approximates the left inner ear structures and involves the  left Meckel's cave and left fifth cranial nerve, and most likely is the causative etiology for patient's underlying left ear and facial symptoms. Follow-up examination with postcontrast imaging suggested for complete evaluation. Inclusion of IAC sequences would likely be helpful for visualization. 2. Additional 2.7 cm lesion involving the right occipital calvarium with adjacent marrow signal abnormality, also likely related to multiple myeloma. Metastatic disease would be the primary differential consideration. 3. Underlying age-appropriate cerebral atrophy with mild chronic microvascular ischemic disease. No other acute intracranial abnormality.   MRA HEAD IMPRESSION:   1. Negative intracranial MRA with no large vessel occlusion, hemodynamically significant stenosis, or other acute vascular abnormality. 2. Petrous and cavernous left ICA is partially encased and surrounded by the left petrous apex lesion without significant irregularity or stenosis. 3. 2 mm right paraophthalmic aneurysm.   MRA NECK IMPRESSION:   Normal MRA of the neck with wide patency of the carotid and vertebral arteries bilaterally. No hemodynamically significant stenosis or other acute vascular abnormality identified.    01/14/2019 - 01/27/2019 Radiation Therapy   By Dr. Tammi Klippel.    Diagnosis:   80 yo woman with skull base myeloma lesion causing left ear pain      Indication for treatment:  Palliative        Radiation treatment dates:   01/14/2019 - 01/27/2019   Site/dose:   Skull Base / 20 Gy in 10 fractions   Beams/energy:   3D, photons / 10X, 6X   04/01/2019 Pathology Results   Bone Marrow biopsy 04/01/19  DIAGNOSIS: BONE MARROW, ASPIRATE, CLOT, CORE: - Normocellular marrow with minimal involvement by plasma cell neoplasm - See comment PERIPHERAL BLOOD: - Macrocytic anemia and leukopenia -See complete blood count   04/09/2019 PET scan   PET 04/09/19  IMPRESSION: 1. Scattered lytic lesions of  bone. A few of these demonstrate hypermetabolic activity, most notably the right occipital bone lesion; the lytic lesion in the approximately C7 spinous process; the lytic expansile lesion of the left fourth rib anteriorly; and a small lytic focus in the right femoral head. Other scattered lytic lesions have only low-grade activity. 2. Multiple fractures as detailed above. 3. Other imaging findings of potential clinical significance: Aortic Atherosclerosis (ICD10-I70.0). Coronary atherosclerosis.   05/10/2019 -  Chemotherapy   -CyBorD (cyclophosphamide (CYTOXAN) 578m (3079mm2), Velcade 1.80m55m2, and dexa 82m59meekly) starting 05/24/19.   -Due to neuropathy after 2 doses of Velcade, I switched to weekly Kyprolis 3 weeks on/1 week off on 06/14/19.   -Chemo held for RT 07/05/19-07/22/19.   -Dexa stopped and Cytoxan dose reduced to 400 mg on day 1, 8 and 15 every 28 days, from cycle 3.     07/05/2019 - 07/20/2019 Radiation Therapy   By Dr. MannTammi Klippeliagnosis:   78 y85woman with painful right occipital and left hip involvement by multiple myeloma      Indication for treatment:  Palliation        Radiation treatment dates:   07/05/19-07/20/19   Site/dose:  1.  The Right Occipital skull lesion was treated to 20 Gy in 20 fractions of 2 Gy 2.  The Left Hip lesion was treated to 20 Gy in 20 fractions of 2 Gy   Beams/energy:      1.  The Right Occipital skull lesion was treated using a 3-field 3D set-up 2.  The Left Hip lesion was treated anterior and posterior fields.      REVIEW OF SYSTEMS:   Constitutional: Denies fevers, chills Respiratory: Has a cough with eating or drinking Cardiovascular: Denies palpitation, chest discomfort Gastrointestinal:  Denies nausea, heartburn or change in bowel habits Skin: Has a rash on her right lower extremity with weeping Lymphatics: Denies new lymphadenopathy or easy bruising Neurological:Denies numbness, tingling or new  weaknesses Behavioral/Psych: Mood is stable, no new changes  All other systems were reviewed with the patient and are negative.  I have reviewed the past medical history, past surgical history, social history and family history with the patient and they are unchanged from previous note.   PHYSICAL EXAMINATION: ECOG PERFORMANCE STATUS: 2 - Symptomatic, <50% confined to bed  Vitals:   03/17/20 0800 03/17/20 1000  BP: (!) 92/51 (!) 90/49  Pulse: 84 78  Resp: (!) 22 16  Temp: (!) 96 F (35.6 C)   SpO2: 96% 98%   Filed Weights   03/15/20 1026 03/17/20 0349  Weight: 51.9 kg 54.9 kg    Intake/Output from previous day: 09/02 0701 - 09/03 0700 In: 442.9 [I.V.:28.8; IV Piggyback:414.1] Out: -   GENERAL: Chronically ill-appearing female, no distress SKIN: Right lower extremity erythematous and edematous, improved compared to yesterday's exam EYES: normal, Conjunctiva are pink and non-injected, sclera clear LUNGS: Nonlabored breathing, clear HEART: regular rate & rhythm and no murmurs ABDOMEN:abdomen soft, non-tender and normal bowel sounds NEURO: alert & oriented x 3 with fluent speech, no focal motor/sensory deficits  LABORATORY DATA:  I have reviewed the data as listed CMP Latest Ref Rng & Units 03/17/2020 03/16/2020 03/15/2020  Glucose 70 - 99 mg/dL 180(H) 97 121(H)  BUN 8 - 23 mg/dL 38(H) 31(H) 23  Creatinine 0.44 - 1.00 mg/dL 1.05(H) 1.17(H) 0.80  Sodium 135 - 145 mmol/L 137 138 138  Potassium 3.5 - 5.1 mmol/L 3.3(L) 3.6 3.8  Chloride 98 - 111 mmol/L 106 109 108  CO2 22 - 32 mmol/L 19(L) 19(L) 19(L)  Calcium 8.9 - 10.3 mg/dL 8.4(L) 8.4(L) 9.1  Total Protein 6.5 - 8.1 g/dL - - -  Total Bilirubin 0.3 - 1.2 mg/dL - - -  Alkaline Phos 38 - 126 U/L - - -  AST 15 - 41 U/L - - -  ALT 0 - 44 U/L - - -    Lab Results  Component Value Date   WBC 4.2 03/17/2020   HGB 7.8 (L) 03/17/2020   HCT 23.8 (L) 03/17/2020   MCV 105.8 (H) 03/17/2020   PLT 98 (L) 03/17/2020   NEUTROABS 6.3  03/15/2020    DG Chest 2 View  Result Date: 03/15/2020 CLINICAL DATA:  Weakness and right leg pain. EXAM: CHEST - 2 VIEW COMPARISON:  08/18/2018 FINDINGS: Patchy bilateral airspace disease. Cardiomegaly and vascular pedicle widening. There is a small right pleural effusion. No pneumothorax. Porta catheter with tip at the right atrium. Remote spinal, rib, and left clavicle fracture deformities. There is history of multiple myeloma with diffuse demineralization. IMPRESSION: Bilateral airspace disease that could be infection or failure. Small right pleural effusion interval cardiomegaly favors failure. Electronically Signed   By: Angelica Chessman  Watts M.D.   On: 03/15/2020 11:05   DG Tibia/Fibula Right  Result Date: 03/15/2020 CLINICAL DATA:  Right leg pain with multiple wounds. Multiple myeloma. EXAM: RIGHT TIBIA AND FIBULA - 2 VIEW COMPARISON:  None. FINDINGS: Nonspecific subcutaneous reticulation. No evidence of fracture. There is generalized osteopenia without visible myeloma in the covered skeleton. IMPRESSION: Generalized soft tissue swelling without acute or malignant osseous finding. Electronically Signed   By: Monte Fantasia M.D.   On: 03/15/2020 11:06   CT ANGIO CHEST PE W OR WO CONTRAST  Result Date: 03/16/2020 CLINICAL DATA:  Elevated D-dimer and history of multiple myeloma. EXAM: CT ANGIOGRAPHY CHEST WITH CONTRAST TECHNIQUE: Multidetector CT imaging of the chest was performed using the standard protocol during bolus administration of intravenous contrast. Multiplanar CT image reconstructions and MIPs were obtained to evaluate the vascular anatomy. CONTRAST:  37m OMNIPAQUE IOHEXOL 350 MG/ML SOLN COMPARISON:  December 30, 2017 FINDINGS: Cardiovascular: A right-sided venous Port-A-Cath is in place. There is mild calcification of the aortic arch. The subsegmental lower lobe branches of the pulmonary arteries are limited in evaluation. No evidence of pulmonary embolism. There is moderate severity  cardiomegaly. No pericardial effusion. Mediastinum/Nodes: No enlarged mediastinal, hilar, or axillary lymph nodes. A subcentimeter calcified right hilar lymph node is seen. Thyroid gland, trachea, and esophagus demonstrate no significant findings. Lungs/Pleura: Mild areas of atelectasis and/or infiltrate are seen within the posterior aspects of the bilateral upper lobes and right lower lobe. Moderate severity left lower lobe atelectasis and/or infiltrate is noted. There is a moderate size right pleural effusion. A small left pleural effusion is also seen. No pneumothorax is identified. Upper Abdomen: No acute abnormality. Musculoskeletal: Numerous lytic lesions are seen scattered throughout the osseous skeleton. Chronic fracture deformities are seen involving the sternum, mid to distal left clavicle and multiple bilateral ribs. Numerous chronic compression fracture deformities are seen throughout the thoracic spine involving the T5 through T9 vertebral bodies and T12 vertebral body. Moderate severity multilevel degenerative changes are also seen throughout the thoracic spine. Review of the MIP images confirms the above findings. IMPRESSION: 1. No evidence of pulmonary embolism. 2. Mild bilateral upper lobes and right lower lobe atelectasis and/or infiltrate. 3. Moderate severity left lower lobe atelectasis and/or infiltrate. 4. Moderate size right pleural effusion. 5. Small left pleural effusion. 6. Numerous lytic lesions scattered throughout the osseous skeleton consistent with multiple myeloma. 7. Numerous chronic compression fracture deformities throughout the thoracic spine. 8. Chronic fracture deformities involving the sternum, mid to distal left clavicle and multiple bilateral ribs. 9. Aortic atherosclerosis. Aortic Atherosclerosis (ICD10-I70.0). Electronically Signed   By: TVirgina NorfolkM.D.   On: 03/16/2020 16:28   IR IMAGING GUIDED PORT INSERTION  Result Date: 03/07/2020 INDICATION: 79year old  female with multiple myeloma in relapse. She requires durable venous access. EXAM: IMPLANTED PORT A CATH PLACEMENT WITH ULTRASOUND AND FLUOROSCOPIC GUIDANCE MEDICATIONS: 1 g vancomycin; The antibiotic was administered within an appropriate time interval prior to skin puncture. ANESTHESIA/SEDATION: Versed 1 mg IV; Fentanyl 50 mcg IV; Moderate Sedation Time:  12 minutes The patient was continuously monitored during the procedure by the interventional radiology nurse under my direct supervision. FLUOROSCOPY TIME:  0 minutes, 36 seconds (5 mGy) COMPLICATIONS: None immediate. PROCEDURE: The right neck and chest was prepped with chlorhexidine, and draped in the usual sterile fashion using maximum barrier technique (cap and mask, sterile gown, sterile gloves, large sterile sheet, hand hygiene and cutaneous antiseptic). Local anesthesia was attained by infiltration with 1% lidocaine with epinephrine. Ultrasound demonstrated  patency of the right internal jugular vein, and this was documented with an image. Under real-time ultrasound guidance, this vein was accessed with a 21 gauge micropuncture needle and image documentation was performed. A small dermatotomy was made at the access site with an 11 scalpel. A 0.018" wire was advanced into the SVC and the access needle exchanged for a 54F micropuncture vascular sheath. The 0.018" wire was then removed and a 0.035" wire advanced into the IVC. An appropriate location for the subcutaneous reservoir was selected below the clavicle and an incision was made through the skin and underlying soft tissues. The subcutaneous tissues were then dissected using a combination of blunt and sharp surgical technique and a pocket was formed. A single lumen low-profile power injectable portacatheter was then tunneled through the subcutaneous tissues from the pocket to the dermatotomy and the port reservoir placed within the subcutaneous pocket. The venous access site was then serially dilated and a  peel away vascular sheath placed over the wire. The wire was removed and the port catheter advanced into position under fluoroscopic guidance. The catheter tip is positioned in the superior cavoatrial junction. This was documented with a spot image. The portacatheter was then tested and found to flush and aspirate well. The port was flushed with saline followed by 100 units/mL heparinized saline. The pocket was then closed in two layers using first subdermal inverted interrupted absorbable sutures followed by a running subcuticular suture. The epidermis was then sealed with Dermabond. The dermatotomy at the venous access site was also closed with Dermabond. IMPRESSION: Successful placement of a right IJ approach Power Port with ultrasound and fluoroscopic guidance. The catheter is ready for use. Electronically Signed   By: Jacqulynn Cadet M.D.   On: 03/07/2020 16:48   VAS [place] LOWER EXTREMITY VENOUS (DVT) (ONLY [facility] & [facility])  Result Date: 03/15/2020  Lower Venous DVTStudy Indications: Edema.  Limitations: Body habitus, poor ultrasound/tissue interface and patient position. Comparison Study: No prior study Performing Technologist: Maudry Mayhew MHA, RDMS, RVT, RDCS  Examination Guidelines: A complete evaluation includes B-mode imaging, spectral Doppler, color Doppler, and power Doppler as needed of all accessible portions of each vessel. Bilateral testing is considered an integral part of a complete examination. Limited examinations for reoccurring indications may be performed as noted. The reflux portion of the exam is performed with the patient in reverse Trendelenburg.  +---------+---------------+---------+-----------+----------+--------------+ RIGHT    CompressibilityPhasicitySpontaneityPropertiesThrombus Aging +---------+---------------+---------+-----------+----------+--------------+ CFV      Full           No       Yes                                  +---------+---------------+---------+-----------+----------+--------------+ SFJ      Full                                                        +---------+---------------+---------+-----------+----------+--------------+ FV Prox  Full                                                        +---------+---------------+---------+-----------+----------+--------------+ FV Mid  Full                                                        +---------+---------------+---------+-----------+----------+--------------+ FV DistalFull                                                        +---------+---------------+---------+-----------+----------+--------------+ PFV      Full                                                        +---------+---------------+---------+-----------+----------+--------------+ POP      Full           No       Yes                                 +---------+---------------+---------+-----------+----------+--------------+ PTV      Full                                                        +---------+---------------+---------+-----------+----------+--------------+ PERO     Full                                                        +---------+---------------+---------+-----------+----------+--------------+   +---------+---------------+---------+-----------+----------+--------------+ LEFT     CompressibilityPhasicitySpontaneityPropertiesThrombus Aging +---------+---------------+---------+-----------+----------+--------------+ CFV      Full           No       Yes                                 +---------+---------------+---------+-----------+----------+--------------+ SFJ      Full                                                        +---------+---------------+---------+-----------+----------+--------------+ FV Prox  Full                                                         +---------+---------------+---------+-----------+----------+--------------+ FV Mid   Full                                                        +---------+---------------+---------+-----------+----------+--------------+  FV DistalFull                                                        +---------+---------------+---------+-----------+----------+--------------+ PFV      Full                                                        +---------+---------------+---------+-----------+----------+--------------+ POP      Full           No       Yes                                 +---------+---------------+---------+-----------+----------+--------------+ PTV      Full                                                        +---------+---------------+---------+-----------+----------+--------------+ PERO     Full                                                        +---------+---------------+---------+-----------+----------+--------------+     Summary: RIGHT: - There is no evidence of deep vein thrombosis in the lower extremity.  - No cystic structure found in the popliteal fossa.  LEFT: - There is no evidence of deep vein thrombosis in the lower extremity.  - A cystic structure is found in the popliteal fossa.  Bilateral lower extremity veins exhibit pulsatile flow, suggestive of possibly elevated right heart pressure.  *See table(s) above for measurements and observations. Electronically signed by Curt Jews MD on 03/15/2020 at 3:12:07 PM.    Final    ECHOCARDIOGRAM LIMITED  Result Date: 03/16/2020    ECHOCARDIOGRAM LIMITED REPORT   Patient Name:   SWATHI DAUPHIN Date of Exam: 03/16/2020 Medical Rec #:  [id number]      Height:       66.0 in Accession #:    [id number]     Weight:       114.4 lb Date of Birth:  17-Feb-1941     BSA:          1.577 m Patient Age:    34 years       BP:           74/50 mmHg Patient Gender: F              HR:           78 bpm. Exam Location:  Inpatient  Procedure: Limited Echo, Color Doppler and Cardiac Doppler Indications:    L24.40 Acute diastolic (congestive) heart failure  History:        Patient has prior history of Echocardiogram examinations, most                 recent 11/08/2019. CHF; Risk Factors:Hypertension.  Sonographer:    Raquel Sarna Senior RDCS Referring Phys: [id number] Harold Hedge  Sonographer Comments: Technically difficult due to small rib spacing. IMPRESSIONS  1. Abnormal septal motion/flattening inferior wall hypokinesis . Left ventricular ejection fraction, by estimation, is 35 to 40%. The left ventricle has moderately decreased function. The left ventricle demonstrates regional wall motion abnormalities (see scoring diagram/findings for description). The left ventricular internal cavity size was mildly dilated. Left ventricular diastolic parameters are indeterminate.  2. Marked RV dysfunction . Right ventricular systolic function is severely reduced. The right ventricular size is moderately enlarged. There is moderately elevated pulmonary artery systolic pressure.  3. Left atrial size was moderately dilated.  4. Right atrial size was moderately dilated.  5. Mild mitral valve regurgitation.  6. Tricuspid valve regurgitation is moderate.  7. The aortic valve is tricuspid. Aortic valve regurgitation is trivial. Mild to moderate aortic valve sclerosis/calcification is present, without any evidence of aortic stenosis.  8. The inferior vena cava is dilated in size with <50% respiratory variability, suggesting right atrial pressure of 15 mmHg. FINDINGS  Left Ventricle: Abnormal septal motion/flattening inferior wall hypokinesis. Left ventricular ejection fraction, by estimation, is 35 to 40%. The left ventricle has moderately decreased function. The left ventricle demonstrates regional wall motion abnormalities. The left ventricular internal cavity size was mildly dilated. Right Ventricle: Marked RV dysfunction. The right ventricular size is moderately  enlarged. Right vetricular wall thickness was not assessed. Right ventricular systolic function is severely reduced. There is moderately elevated pulmonary artery systolic pressure. The tricuspid regurgitant velocity is 2.78 m/s, and with an assumed right atrial pressure of 15 mmHg, the estimated right ventricular systolic pressure is 51.7 mmHg. Left Atrium: Left atrial size was moderately dilated. Right Atrium: Right atrial size was moderately dilated. Pericardium: There is no evidence of pericardial effusion. Mitral Valve: There is mild thickening of the mitral valve leaflet(s). There is mild calcification of the mitral valve leaflet(s). Mild mitral valve regurgitation. Tricuspid Valve: Tricuspid valve regurgitation is moderate. Aortic Valve: The aortic valve is tricuspid. Aortic valve regurgitation is trivial. Mild to moderate aortic valve sclerosis/calcification is present, without any evidence of aortic stenosis. Pulmonic Valve: The pulmonic valve was not well visualized. Venous: The inferior vena cava is dilated in size with less than 50% respiratory variability, suggesting right atrial pressure of 15 mmHg. LEFT VENTRICLE PLAX 2D LVOT diam:     1.90 cm LV SV:         41 LV SV Index:   26 LVOT Area:     2.84 cm  LV Volumes (MOD) LV vol d, MOD A2C: 115.0 ml LV vol d, MOD A4C: 94.5 ml LV vol s, MOD A2C: 69.0 ml LV vol s, MOD A4C: 63.3 ml LV SV MOD A2C:     46.0 ml LV SV MOD A4C:     94.5 ml LV SV MOD BP:      35.1 ml RIGHT VENTRICLE RV S prime:     5.44 cm/s TAPSE (M-mode): 1.0 cm AORTIC VALVE LVOT Vmax:   75.10 cm/s LVOT Vmean:  53.400 cm/s LVOT VTI:    0.146 m TRICUSPID VALVE TR Peak grad:   30.9 mmHg TR Vmax:        278.00 cm/s  SHUNTS Systemic VTI:  0.15 m Systemic Diam: 1.90 cm Jenkins Rouge MD Electronically signed by Jenkins Rouge MD Signature Date/Time: 03/16/2020/8:46:18 AM    Final     ASSESSMENT AND PLAN: 1.  IgG kappa multiple myeloma 2.  Sepsis 3.  Right lower extremity cellulitis 4.  Acute  biventricular heart failure 5.  Pseudomonas bacteremia 6.  AKI 7.  Cough-?  Aspiration 8.  Goals of care, DNR   -Ms. Selke is stable at this time.  She continues to have intermittent hypotension.  Remains on midodrine.  PCCM is following. -The patient remains on IV antibiotics for Pseudomonas bacteremia.  She remains afebrile.  Infectious disease is following closely. -Cardiology plans for thoracentesis today for her pleural effusion.  They are considering TEE. -Recommend speech therapy evaluation for coughing after eating and drinking when she is medically stable to complete this evaluation. -Chemotherapy will be placed on hold.   -Goals of care discussed with the patient.  Agree with DNR/DNI.  The patient is agreeable to continue aggressive interventions to see if she shows improvement.  However, if she is not improving, she would agree to transitioning to a comfort based approach.  I have again updated the patient's daughter, [name], who was on the phone at the time my visit.  All questions were answered.   LOS: 2 days   Mikey Bussing, DNP, AGPCNP-BC, AOCNP 03/17/20    Addendum  I have seen the patient, examined her. I agree with the assessment and and plan and have edited the notes.   Pt is still critically ill, blood pressure borderline low, on hydrocortisone and midodrine.  Urine output is low.  She underwent right thoracentesis.  CT scan reviewed. She is on cefepime.  Please continue maximal medical treatment, to see if she would turn around over the next few days. Please call us if needed over the weekend. I will f/u on next Tuesday.   [name] 03/17/2020

## 2020-03-17 NOTE — Progress Notes (Addendum)
PROGRESS NOTE  Shannon Rush  JFH:[id number] DOB: 1941/07/14 DOA: 03/15/2020 PCP: Shannon Resides, MD   Brief Narrative: Per HPI: This is a 79 year old female with past medical history of multiple myeloma on Cytoxan, Kyprolis, Dexa (follows with Shannon Rush), diastolic heart failure (EF 55 to 60% in April), CAD, inferior STEMI in 2015, pancytopenia, osteoporosis who was admitted to [facility] on 7/15 for left lower extremity cellulitis and developed right leg edema on 7/30 with a negative Doppler at the time and was put on Lasix for swelling which initially improved who has had increasing pain and swelling of the RLE over the past few days as well as fatigue.  No relief despite home morphine.  Had noted some weeping from her extremity.  no fever or chills or any other complaint.   ED Course: Vitals: Hemodynamically stable on room air.  Notable labs: Bicarb 19, glucose 121, BNP 2409, lactic acid 1.7, WBC 7.4, Hb 8.8, platelets 131, COVID-19 negative.  Bilateral lower extremity ultrasound: Negative for DVT but suggestive of possible elevated right heart pressure.  Received 1 L NS bolus for infection and possible sepsis however CXR was concerning for probable heart failure and further fluids were discontinued.  Was given vancomycin and Ancef x1"  She was admitted under hospital service and subsequently her blood cultures identified Pseudomonas, she developed hypotension which worsened, echocardiogram revealed RV and LV systolic dysfunction.  Due to hypotension, PCCM was consulted and she was moved to stepdown unit.  Assessment & Plan: Principal Problem:   Cellulitis Active Problems:   Multiple myeloma (HCC)   Pleural effusion, right   Acute combined systolic and diastolic HF (heart failure) (HCC)   Acute metabolic encephalopathy   Essential hypertension   Bacteremia due to Pseudomonas  Hypotension: Multifactorial with diminished cardiac output as well as sepsis due to bacteremia.  - PCCM consulted  given persistent hypotension.  Lactic acid, however, remains wnl.  Still with low blood pressure but she is alert and oriented.  We'll continue stress dose steroids as she takes dexamethasone with chemotherapy.  Midodrine has been added by PCCM.  She just had thoracentesis done with retrieval of 550 cc clear liquid taken out by PCCM.  Cardiology hopes that the blood pressure will improve after this, if not then she may need milrinone.  Acute biventricular heart failure, HTN: RV dysfunction appears to be new on echocardiogram.  - Cardiology consulted. CTA chest ruled out PE. - Unable to administer beta blocker with hypotension.  Cardiology managing.  Might need milrinone.  Gram-negative rods/Pseudomonas bacteremia:  - ID, Shannon Rush, consulted for further recommendations.  - Switched from vancomycin and meropenem to cefepime which the patient has tolerated thus far.  Blood culture repeated today 03/17/2020.  RLE cellulitis: In setting of chemotherapy, MM, opportunistic infection with pseudomonas is feasible. Swelling noted, negative venous U/S for DVT, though pulsations suggested high RV pressures.  No further signs of cellulitis today but continues to have some +1 pitting edema right lower extremity.  Continue to biotics.  AKI: Likely from hypoperfusion due to hypotension. Creatinine improving.  Only intake charted but no output.  Hypokalemia: 3.3.  Replace orally.  Multiple myeloma in relapse: Recently had port inserted and restarted chemotherapy under Shannon Rush and [facility] oncology Shannon Rush.  - Port site does not appear infected at this time.  -Oncology on board.  HLD:  - Statin  DVT prophylaxis: Lovenox Code Status: DNR, would desire pressors/ICU level of care. Family Communication: None present at bedside.  Patient alert and oriented.  Plan of care discussed with her.  No request to call family. I updated her daughter Shannon Rush later this pm. Disposition Plan:  Status is:  Inpatient  Remains inpatient appropriate because:Hemodynamically unstable  Dispo: The patient is from: Home              Anticipated d/c is to: TBD              Anticipated d/c date is: 3 days              Patient currently is not medically stable to d/c.  Consultants:   PCCM  ID  Oncology  Cardiology  Procedures:   Echocardiogram  Antimicrobials:  Vancomycin, ancef, meropenem 9/1  Acyclovir 9/1 >>  Cefepime 9/2 >>   Subjective: Patient seen and examined.  Although weak but able to communicate well.  Denied any complaint.  She was happy that she does not have any erythema on the right lower extremity anymore.  Objective: Vitals:   03/17/20 0800 03/17/20 1000 03/17/20 1200 03/17/20 1206  BP: (!) 92/51 (!) 90/49 (!) 95/51   Pulse: 84 78 86   Resp: (!) 22 16 (!) 24   Temp: (!) 96 F (35.6 C)   (!) 96.8 F (36 C)  TempSrc: Oral   Oral  SpO2: 96% 98% 95%   Weight:      Height:        Intake/Output Summary (Last 24 hours) at 03/17/2020 1334 Last data filed at 03/17/2020 0329 Gross per 24 hour  Intake 442.93 ml  Output --  Net 442.93 ml   Filed Weights   03/15/20 1026 03/17/20 0349  Weight: 51.9 kg 54.9 kg   general exam: Appears weak but comfortable Respiratory system: Clear to auscultation. Respiratory effort normal. Cardiovascular system: S1 & S2 heard, RRR. No JVD, murmurs, rubs, gallops or clicks.  +1 pitting edema right lower extremity. Gastrointestinal system: Abdomen is nondistended, soft and nontender. No organomegaly or masses felt. Normal bowel sounds heard. Central nervous system: Alert and oriented. No focal neurological deficits. Extremities: Symmetric 5 x 5 power. Skin: No rashes, lesions or ulcers.     Data Reviewed: I have personally reviewed following labs and imaging studies  CBC: Recent Labs  Lab 03/15/20 1021 03/16/20 0551 03/17/20 0254  WBC 7.4 2.3* 4.2  NEUTROABS 6.3  --   --   HGB 8.8* 8.1* 7.8*  HCT 26.9* 24.1* 23.8*  MCV  106.3* 105.2* 105.8*  PLT 131* 102* 98*   Basic Metabolic Panel: Recent Labs  Lab 03/15/20 1021 03/16/20 1100 03/17/20 0254  NA 138 138 137  K 3.8 3.6 3.3*  CL 108 109 106  CO2 19* 19* 19*  GLUCOSE 121* 97 180*  BUN 23 31* 38*  CREATININE 0.80 1.17* 1.05*  CALCIUM 9.1 8.4* 8.4*  MG  --   --  2.2   GFR: Estimated Creatinine Clearance: 38.9 mL/min (A) (by C-G formula based on SCr of 1.05 mg/dL (H)).  Urine analysis:    Component Value Date/Time   COLORURINE YELLOW 08/18/2018 [place] 08/18/2018 1313   LABSPEC 1.013 08/18/2018 1313   LABSPEC 1.015 08/20/2012 1124   PHURINE 7.0 08/18/2018 1313   GLUCOSEU NEGATIVE 08/18/2018 1313   GLUCOSEU Negative 08/20/2012 1124   HGBUR NEGATIVE 08/18/2018 1313   HGBUR negative 10/22/2007 1336   BILIRUBINUR NEGATIVE 08/18/2018 1313   BILIRUBINUR Negative 08/20/2012 1124   KETONESUR 5 (A) 08/18/2018 1313   PROTEINUR 30 (A)  08/18/2018 1313   UROBILINOGEN 0.2 09/14/2014 2100   UROBILINOGEN 0.2 08/20/2012 1124   NITRITE NEGATIVE 08/18/2018 1313   LEUKOCYTESUR NEGATIVE 08/18/2018 1313   LEUKOCYTESUR Small 08/20/2012 1124   Recent Results (from the past 240 hour(s))  Aerobic Culture (superficial specimen)     Status: Abnormal   Collection Time: 03/15/20 10:21 AM   Specimen: Leg  Result Value Ref Range Status   Specimen Description   Final    LEG RIGHT Performed at [facility] [address], [place], [place] [address]    Special Requests   Final    Immunocompromised Performed at [facility], [place] [address], [place], [place] [address]    Gram Stain   Final    NO WBC SEEN MODERATE GRAM VARIABLE ROD FEW GRAM POSITIVE COCCI FEW GRAM NEGATIVE RODS Performed at [facility], [place] [address], [place], [place] [address]    Culture MULTIPLE ORGANISMS PRESENT, NONE PREDOMINANT (A)  Final   Report Status 03/17/2020 FINAL  Final  SARS Coronavirus 2 by RT PCR (hospital order,  performed in [facility] hospital lab) Nasopharyngeal Nasopharyngeal Swab     Status: None   Collection Time: 03/15/20 10:26 AM   Specimen: Nasopharyngeal Swab  Result Value Ref Range Status   SARS Coronavirus 2 NEGATIVE NEGATIVE Final    Comment: (NOTE) SARS-CoV-2 target nucleic acids are NOT DETECTED.  The SARS-CoV-2 RNA is generally detectable in upper and lower respiratory specimens during the acute phase of infection. The lowest concentration of SARS-CoV-2 viral copies this assay can detect is 250 copies / mL. A negative result does not preclude SARS-CoV-2 infection and should not be used as the sole basis for treatment or other patient management decisions.  A negative result may occur with improper specimen collection / handling, submission of specimen other than nasopharyngeal swab, presence of viral mutation(s) within the areas targeted by this assay, and inadequate number of viral copies (<250 copies / mL). A negative result must be combined with clinical observations, patient history, and epidemiological information.  Fact Sheet for Patients:   StrictlyIdeas.no  Fact Sheet for Healthcare Providers: BankingDealers.co.za  This test is not yet approved or  cleared by the [place] FDA and has been authorized for detection and/or diagnosis of SARS-CoV-2 by FDA under an Emergency Use Authorization (EUA).  This EUA will remain in effect (meaning this test can be used) for the duration of the COVID-19 declaration under Section 564(b)(1) of the Act, 21 U.S.C. section 360bbb-3(b)(1), unless the authorization is terminated or revoked sooner.  Performed at [facility], [place] [address] [place], [place] [address]   Culture, blood (routine x 2)     Status: Abnormal (Preliminary result)   Collection Time: 03/15/20 11:10 AM   Specimen: BLOOD  Result Value Ref Range Status   Specimen Description   Final    BLOOD  RIGHT ANTECUBITAL Performed at [facility] [address], [place], [place] [address]    Special Requests   Final    BOTTLES DRAWN AEROBIC AND ANAEROBIC Blood Culture adequate volume Performed at [facility] [address], [place],  [address]    Culture  Setup Time   Final    IN BOTH AEROBIC AND ANAEROBIC BOTTLES GRAM NEGATIVE RODS Organism ID to follow CRITICAL RESULT CALLED TO, READ BACK BY AND VERIFIED WITH: Sheffield Slider [facility] 03/16/20 0600 JDW    Culture (A)  Final    PSEUDOMONAS AERUGINOSA  SUSCEPTIBILITIES TO FOLLOW Performed at [facility], [place] [address] [place], [place] [address]    Report Status PENDING  Incomplete  Blood Culture ID Panel (Reflexed)     Status: Abnormal   Collection Time: 03/15/20 11:10 AM  Result Value Ref Range Status   Enterococcus faecalis NOT DETECTED NOT DETECTED Final   Enterococcus Faecium NOT DETECTED NOT DETECTED Final   Listeria monocytogenes NOT DETECTED NOT DETECTED Final   Staphylococcus species NOT DETECTED NOT DETECTED Final   Staphylococcus aureus (BCID) NOT DETECTED NOT DETECTED Final   Staphylococcus epidermidis NOT DETECTED NOT DETECTED Final   Staphylococcus lugdunensis NOT DETECTED NOT DETECTED Final   Streptococcus species NOT DETECTED NOT DETECTED Final   Streptococcus agalactiae NOT DETECTED NOT DETECTED Final   Streptococcus pneumoniae NOT DETECTED NOT DETECTED Final   Streptococcus pyogenes NOT DETECTED NOT DETECTED Final   A.calcoaceticus-baumannii NOT DETECTED NOT DETECTED Final   Bacteroides fragilis NOT DETECTED NOT DETECTED Final   Enterobacterales NOT DETECTED NOT DETECTED Final   Enterobacter cloacae complex NOT DETECTED NOT DETECTED Final   Escherichia coli NOT DETECTED NOT DETECTED Final   Klebsiella aerogenes NOT DETECTED NOT DETECTED Final   Klebsiella oxytoca NOT DETECTED NOT DETECTED Final   Klebsiella pneumoniae NOT DETECTED NOT DETECTED Final   Proteus  species NOT DETECTED NOT DETECTED Final   Salmonella species NOT DETECTED NOT DETECTED Final   Serratia marcescens NOT DETECTED NOT DETECTED Final   Haemophilus influenzae NOT DETECTED NOT DETECTED Final   Neisseria meningitidis NOT DETECTED NOT DETECTED Final   Pseudomonas aeruginosa DETECTED (A) NOT DETECTED Final    Comment: CRITICAL RESULT CALLED TO, READ BACK BY AND VERIFIED WITH: Sheffield Slider PHARMD 03/16/20 0600 JDW    Stenotrophomonas maltophilia NOT DETECTED NOT DETECTED Final   Candida albicans NOT DETECTED NOT DETECTED Final   Candida auris NOT DETECTED NOT DETECTED Final   Candida glabrata NOT DETECTED NOT DETECTED Final   Candida krusei NOT DETECTED NOT DETECTED Final   Candida parapsilosis NOT DETECTED NOT DETECTED Final   Candida tropicalis NOT DETECTED NOT DETECTED Final   Cryptococcus neoformans/gattii NOT DETECTED NOT DETECTED Final   CTX-M ESBL NOT DETECTED NOT DETECTED Final   Carbapenem resistance IMP NOT DETECTED NOT DETECTED Final   Carbapenem resistance KPC NOT DETECTED NOT DETECTED Final   Carbapenem resistance NDM NOT DETECTED NOT DETECTED Final   Carbapenem resistance VIM NOT DETECTED NOT DETECTED Final    Comment: Performed at [facility], [address], [place], [place] [address]  MRSA PCR Screening     Status: None   Collection Time: 03/16/20  2:30 PM   Specimen: Nasopharyngeal  Result Value Ref Range Status   MRSA by PCR NEGATIVE NEGATIVE Final    Comment:        The GeneXpert MRSA Assay (FDA approved for NASAL specimens only), is one component of a comprehensive MRSA colonization surveillance program. It is not intended to diagnose MRSA infection nor to guide or monitor treatment for MRSA infections. Performed at [facility], [place] [address], [place], [place] [address]       Radiology Studies: CT ANGIO CHEST PE W OR WO CONTRAST  Result Date: 03/16/2020 CLINICAL DATA:  Elevated D-dimer and history of multiple myeloma.  EXAM: CT ANGIOGRAPHY CHEST WITH CONTRAST TECHNIQUE: Multidetector CT imaging of the chest was performed using the standard protocol during bolus administration of intravenous contrast. Multiplanar CT image reconstructions and MIPs were obtained to evaluate the vascular anatomy.  CONTRAST:  16m OMNIPAQUE IOHEXOL 350 MG/ML SOLN COMPARISON:  December 30, 2017 FINDINGS: Cardiovascular: A right-sided venous Port-A-Cath is in place. There is mild calcification of the aortic arch. The subsegmental lower lobe branches of the pulmonary arteries are limited in evaluation. No evidence of pulmonary embolism. There is moderate severity cardiomegaly. No pericardial effusion. Mediastinum/Nodes: No enlarged mediastinal, hilar, or axillary lymph nodes. A subcentimeter calcified right hilar lymph node is seen. Thyroid gland, trachea, and esophagus demonstrate no significant findings. Lungs/Pleura: Mild areas of atelectasis and/or infiltrate are seen within the posterior aspects of the bilateral upper lobes and right lower lobe. Moderate severity left lower lobe atelectasis and/or infiltrate is noted. There is a moderate size right pleural effusion. A small left pleural effusion is also seen. No pneumothorax is identified. Upper Abdomen: No acute abnormality. Musculoskeletal: Numerous lytic lesions are seen scattered throughout the osseous skeleton. Chronic fracture deformities are seen involving the sternum, mid to distal left clavicle and multiple bilateral ribs. Numerous chronic compression fracture deformities are seen throughout the thoracic spine involving the T5 through T9 vertebral bodies and T12 vertebral body. Moderate severity multilevel degenerative changes are also seen throughout the thoracic spine. Review of the MIP images confirms the above findings. IMPRESSION: 1. No evidence of pulmonary embolism. 2. Mild bilateral upper lobes and right lower lobe atelectasis and/or infiltrate. 3. Moderate severity left lower lobe  atelectasis and/or infiltrate. 4. Moderate size right pleural effusion. 5. Small left pleural effusion. 6. Numerous lytic lesions scattered throughout the osseous skeleton consistent with multiple myeloma. 7. Numerous chronic compression fracture deformities throughout the thoracic spine. 8. Chronic fracture deformities involving the sternum, mid to distal left clavicle and multiple bilateral ribs. 9. Aortic atherosclerosis. Aortic Atherosclerosis (ICD10-I70.0). Electronically Signed   By: TVirgina NorfolkM.D.   On: 03/16/2020 16:28   DG Chest Port 1 View  Result Date: 03/17/2020 CLINICAL DATA:  Status post thoracentesis. EXAM: PORTABLE CHEST 1 VIEW COMPARISON:  March 15, 2020. FINDINGS: Stable cardiomegaly. No pneumothorax is noted. Stable right-sided Port-A-Cath. Mild bibasilar subsegmental atelectasis is noted with small left pleural effusion. Old left rib fractures are noted. IMPRESSION: Mild bibasilar subsegmental atelectasis with small left pleural effusion. No pneumothorax is noted. Electronically Signed   By: JMarijo ConceptionM.D.   On: 03/17/2020 13:16   VAS [place]OWER EXTREMITY VENOUS (DVT) (ONLY [facility] & [facility])  Result Date: 03/15/2020  Lower Venous DVTStudy Indications: Edema.  Limitations: Body habitus, poor ultrasound/tissue interface and patient position. Comparison Study: No prior study Performing Technologist: MMaudry MayhewMHA, RDMS, RVT, RDCS  Examination Guidelines: A complete evaluation includes B-mode imaging, spectral Doppler, color Doppler, and power Doppler as needed of all accessible portions of each vessel. Bilateral testing is considered an integral part of a complete examination. Limited examinations for reoccurring indications may be performed as noted. The reflux portion of the exam is performed with the patient in reverse Trendelenburg.  +---------+---------------+---------+-----------+----------+--------------+ RIGHT     CompressibilityPhasicitySpontaneityPropertiesThrombus Aging +---------+---------------+---------+-----------+----------+--------------+ CFV      Full           No       Yes                                 +---------+---------------+---------+-----------+----------+--------------+ SFJ      Full                                                        +---------+---------------+---------+-----------+----------+--------------+  FV Prox  Full                                                        +---------+---------------+---------+-----------+----------+--------------+ FV Mid   Full                                                        +---------+---------------+---------+-----------+----------+--------------+ FV DistalFull                                                        +---------+---------------+---------+-----------+----------+--------------+ PFV      Full                                                        +---------+---------------+---------+-----------+----------+--------------+ POP      Full           No       Yes                                 +---------+---------------+---------+-----------+----------+--------------+ PTV      Full                                                        +---------+---------------+---------+-----------+----------+--------------+ PERO     Full                                                        +---------+---------------+---------+-----------+----------+--------------+   +---------+---------------+---------+-----------+----------+--------------+ LEFT     CompressibilityPhasicitySpontaneityPropertiesThrombus Aging +---------+---------------+---------+-----------+----------+--------------+ CFV      Full           No       Yes                                 +---------+---------------+---------+-----------+----------+--------------+ SFJ      Full                                                         +---------+---------------+---------+-----------+----------+--------------+ FV Prox  Full                                                        +---------+---------------+---------+-----------+----------+--------------+  FV Mid   Full                                                        +---------+---------------+---------+-----------+----------+--------------+ FV DistalFull                                                        +---------+---------------+---------+-----------+----------+--------------+ PFV      Full                                                        +---------+---------------+---------+-----------+----------+--------------+ POP      Full           No       Yes                                 +---------+---------------+---------+-----------+----------+--------------+ PTV      Full                                                        +---------+---------------+---------+-----------+----------+--------------+ PERO     Full                                                        +---------+---------------+---------+-----------+----------+--------------+     Summary: RIGHT: - There is no evidence of deep vein thrombosis in the lower extremity.  - No cystic structure found in the popliteal fossa.  LEFT: - There is no evidence of deep vein thrombosis in the lower extremity.  - A cystic structure is found in the popliteal fossa.  Bilateral lower extremity veins exhibit pulsatile flow, suggestive of possibly elevated right heart pressure.  *See table(s) above for measurements and observations. Electronically signed by Curt Jews MD on 03/15/2020 at 3:12:07 PM.    Final    ECHOCARDIOGRAM LIMITED  Result Date: 03/16/2020    ECHOCARDIOGRAM LIMITED REPORT   Patient Name:   Shannon Rush Date of Exam: 03/16/2020 Medical Rec #:  [id number]      Height:       66.0 in Accession #:    [id number]     Weight:       114.4 lb Date of Birth:   [birth year]     BSA:          1.577 m Patient Age:    25 years       BP:           74/50 mmHg Patient Gender: F              HR:           78  bpm. Exam Location:  Inpatient Procedure: Limited Echo, Color Doppler and Cardiac Doppler Indications:    Q30.09 Acute diastolic (congestive) heart failure  History:        Patient has prior history of Echocardiogram examinations, most                 recent 11/08/2019. CHF; Risk Factors:Hypertension.  Sonographer:    Raquel Sarna Senior RDCS Referring Phys: [id number] Harold Hedge  Sonographer Comments: Technically difficult due to small rib spacing. IMPRESSIONS  1. Abnormal septal motion/flattening inferior wall hypokinesis . Left ventricular ejection fraction, by estimation, is 35 to 40%. The left ventricle has moderately decreased function. The left ventricle demonstrates regional wall motion abnormalities (see scoring diagram/findings for description). The left ventricular internal cavity size was mildly dilated. Left ventricular diastolic parameters are indeterminate.  2. Marked RV dysfunction . Right ventricular systolic function is severely reduced. The right ventricular size is moderately enlarged. There is moderately elevated pulmonary artery systolic pressure.  3. Left atrial size was moderately dilated.  4. Right atrial size was moderately dilated.  5. Mild mitral valve regurgitation.  6. Tricuspid valve regurgitation is moderate.  7. The aortic valve is tricuspid. Aortic valve regurgitation is trivial. Mild to moderate aortic valve sclerosis/calcification is present, without any evidence of aortic stenosis.  8. The inferior vena cava is dilated in size with <50% respiratory variability, suggesting right atrial pressure of 15 mmHg. FINDINGS  Left Ventricle: Abnormal septal motion/flattening inferior wall hypokinesis. Left ventricular ejection fraction, by estimation, is 35 to 40%. The left ventricle has moderately decreased function. The left ventricle demonstrates regional  wall motion abnormalities. The left ventricular internal cavity size was mildly dilated. Right Ventricle: Marked RV dysfunction. The right ventricular size is moderately enlarged. Right vetricular wall thickness was not assessed. Right ventricular systolic function is severely reduced. There is moderately elevated pulmonary artery systolic pressure. The tricuspid regurgitant velocity is 2.78 m/s, and with an assumed right atrial pressure of 15 mmHg, the estimated right ventricular systolic pressure is 22.6 mmHg. Left Atrium: Left atrial size was moderately dilated. Right Atrium: Right atrial size was moderately dilated. Pericardium: There is no evidence of pericardial effusion. Mitral Valve: There is mild thickening of the mitral valve leaflet(s). There is mild calcification of the mitral valve leaflet(s). Mild mitral valve regurgitation. Tricuspid Valve: Tricuspid valve regurgitation is moderate. Aortic Valve: The aortic valve is tricuspid. Aortic valve regurgitation is trivial. Mild to moderate aortic valve sclerosis/calcification is present, without any evidence of aortic stenosis. Pulmonic Valve: The pulmonic valve was not well visualized. Venous: The inferior vena cava is dilated in size with less than 50% respiratory variability, suggesting right atrial pressure of 15 mmHg. LEFT VENTRICLE PLAX 2D LVOT diam:     1.90 cm LV SV:         41 LV SV Index:   26 LVOT Area:     2.84 cm  LV Volumes (MOD) LV vol d, MOD A2C: 115.0 ml LV vol d, MOD A4C: 94.5 ml LV vol s, MOD A2C: 69.0 ml LV vol s, MOD A4C: 63.3 ml LV SV MOD A2C:     46.0 ml LV SV MOD A4C:     94.5 ml LV SV MOD BP:      35.1 ml RIGHT VENTRICLE RV S prime:     5.44 cm/s TAPSE (M-mode): 1.0 cm AORTIC VALVE LVOT Vmax:   75.10 cm/s LVOT Vmean:  53.400 cm/s LVOT VTI:    0.146 m TRICUSPID VALVE TR Peak  grad:   30.9 mmHg TR Vmax:        278.00 cm/s  SHUNTS Systemic VTI:  0.15 m Systemic Diam: 1.90 cm Jenkins Rouge MD Electronically signed by Jenkins Rouge MD  Signature Date/Time: 03/16/2020/8:46:18 AM    Final     Scheduled Meds: . aspirin EC  81 mg Oral Daily  . atorvastatin  20 mg Oral Daily  . calcium-vitamin D  1 tablet Oral Q breakfast  . Chlorhexidine Gluconate Cloth  6 each Topical Daily  . enoxaparin (LOVENOX) injection  40 mg Subcutaneous Q24H  . hydrocortisone sod succinate (SOLU-CORTEF) inj  50 mg Intravenous Q6H  . mouth rinse  15 mL Mouth Rinse BID  . metoprolol succinate  25 mg Oral Daily  . midodrine  5 mg Oral TID WC   Continuous Infusions: . sodium chloride 10 mL/hr at 03/17/20 0329  . acyclovir Stopped (03/17/20 0131)  . ceFEPime (MAXIPIME) IV Stopped (03/17/20 1128)     LOS: 2 days   Time spent: 36 minutes.  Darliss Cheney, MD [facility] [email or web address] 03/17/2020, 1:34 PM

## 2020-03-17 NOTE — TOC Initial Note (Signed)
Transition of Care [facility]) - Initial/Assessment Note    Patient Details  Name: Shannon Rush MRN: [id number] Date of Birth: Jun 14, 1941  [facility]) CM/SW Contact:    Leeroy Cha, RN Phone Number: 03/17/2020, 7:46 AM  Clinical Narrative:                 Patient with cellulitis and sepsis.iv aovirax and maxipime. Lives with spouse Following for progression and toc needs.  Expected Discharge Plan: Home/Self Care Barriers to Discharge: Continued Medical Work up   Patient Goals and CMS Choice Patient states their goals for this hospitalization and ongoing recovery are:: to go home CMS Medicare.gov Compare Post Acute Care list provided to:: Patient    Expected Discharge Plan and Services Expected Discharge Plan: Home/Self Care   Discharge Planning Services: CM Consult   Living arrangements for the past 2 months: Single Family Home Expected Discharge Date:  (unknown)                                    Prior Living Arrangements/Services Living arrangements for the past 2 months: Single Family Home Lives with:: Spouse Patient language and need for interpreter reviewed:: Yes Do you feel safe going back to the place where you live?: Yes      Need for Family Participation in Patient Care: Yes (Comment) Care giver support system in place?: Yes (comment)   Criminal Activity/Legal Involvement Pertinent to Current Situation/Hospitalization: No - Comment as needed  Activities of Daily Living Home Assistive Devices/Equipment: Walker (specify type), Eyeglasses, Grab bars around toilet, Grab bars in shower, Other (Comment) (4 wheeled walker, walk-in shower, handicap toilet) ADL Screening (condition at time of admission) Patient's cognitive ability adequate to safely complete daily activities?: No (patient is lethargic and not answering questions-husband here and answering questions) Is the patient deaf or have difficulty hearing?: No Does the patient have  difficulty seeing, even when wearing glasses/contacts?: No Does the patient have difficulty concentrating, remembering, or making decisions?: Yes Patient able to express need for assistance with ADLs?: No Does the patient have difficulty dressing or bathing?: Yes Independently performs ADLs?: No Communication: Independent Dressing (OT): Needs assistance Is this a change from baseline?: Change from baseline, expected to last >3 days Grooming: Needs assistance Is this a change from baseline?: Change from baseline, expected to last >3 days Feeding: Needs assistance Is this a change from baseline?: Change from baseline, expected to last >3 days Bathing: Needs assistance Is this a change from baseline?: Change from baseline, expected to last >3 days Toileting: Dependent Is this a change from baseline?: Change from baseline, expected to last >3days In/Out Bed: Dependent Is this a change from baseline?: Change from baseline, expected to last >3 days Walks in Home: Dependent Is this a change from baseline?: Change from baseline, expected to last >3 days Does the patient have difficulty walking or climbing stairs?: Yes (secondary to weakness) Weakness of Legs: Both Weakness of Arms/Hands: Both  Permission Sought/Granted                  Emotional Assessment Appearance:: Appears stated age Attitude/Demeanor/Rapport: Engaged Affect (typically observed): Calm Orientation: : Oriented to Self, Oriented to Place, Oriented to  Time, Oriented to Situation Alcohol / Substance Use: Not Applicable Psych Involvement: No (comment)  Admission diagnosis:  Cellulitis [L03.90] Thrombocytopenia (HCC) [D69.6] Cellulitis of right lower extremity [L03.115] Bacteremia due to Pseudomonas [R78.81, B96.5] Multiple  myeloma (HCC) [C90.00] Anemia, unspecified type [D64.9] Congestive heart failure, unspecified HF chronicity, unspecified heart failure type [facility]) [I50.9] Patient Active Problem List    Diagnosis Date Noted  . Bacteremia due to Pseudomonas 03/16/2020  . Cellulitis 03/15/2020  . Acute diastolic CHF (congestive heart failure) ([place]) 03/15/2020  . Acute metabolic encephalopathy 61/44/3154  . Essential hypertension 03/15/2020  . Cellulitis, leg 02/04/2020  . TIA (transient ischemic attack) 12/22/2018  . Vertigo 11/24/2018  . Hammer toe of right foot 11/24/2018  . Delayed wound healing 11/24/2018  . Acute bronchitis 08/29/2018  . Closed fracture of multiple pubic rami ([place]) 01/27/2018  . Closed fracture of multiple pubic rami, initial encounter ([place]) 01/27/2018  . Port-A-Cath in place 11/21/2017  . Goals of care, counseling/discussion 02/28/2017  . Leg edema, right 12/04/2016  . Non-ischemic cardiomyopathy ([place]) 12/04/2016  . Antineoplastic chemotherapy induced anemia 03/28/2016  . Diastolic dysfunction-grade 2 03/27/2016  . Patient on antineoplastic chemotherapy regimen   . Adrenocortical insufficiency ([place])   . Drug-induced neutropenia ([place]) 03/11/2016  . HCAP (healthcare-associated pneumonia) 03/10/2016  . Corn of foot 11/04/2014  . Protein-calorie malnutrition ([place]) 09/23/2014  . Pancytopenia ([place]) 08/10/2014  . CAD -50-60% RCA Aug 2015 03/11/2014  . History of STEMI-Aug 2015- secondary to thrombotic state from chemo 03/10/2014  . Unspecified vitamin D deficiency 05/05/2013  . History of peripheral stem cell transplant ([place]) 03/05/2013  . History of organ or tissue transplant 03/05/2013  . Swelling, mass, or lump in chest 12/24/2012  . Multiple myeloma ([place]) 07/01/2012  . Sciatica of left side 06/03/2011  . Osteoporosis 06/11/2010  . Anemia, macrocytic 06/02/2009  . DJD, UNSPECIFIED 09/11/2006   PCP:  [name], MD Pharmacy:   [id number] [place] [place]Phone: [phone]: 3[place]# [address] [place]Phone: [phone]: [phone] [place] [address] [place] [place]Phone: [phone]: 8[place] [place]Phone: [phone]: 4[place] [address]Phone: [phone]: [phone]    Social Determinants of Health (SDOH) Interventions    Readmission Risk Interventions No flowsheet data found.

## 2020-03-17 NOTE — Progress Notes (Signed)
Pt's BP 81/42, MAP 55, provider notified via amion.

## 2020-03-17 NOTE — Procedures (Signed)
Thoracentesis  Procedure Note  Shannon Rush  [id number]  Oct 13, 1940  Date:03/17/20  Time:12:33 PM   Provider Performing:Pete E Kary Kos   Procedure: Thoracentesis with imaging guidance (11155)  Indication(s) Pleural Effusion  Consent Risks of the procedure as well as the alternatives and risks of each were explained to the patient and/or caregiver.  Consent for the procedure was obtained and is signed in the bedside chart  Anesthesia Topical only with 1% lidocaine    Time Out Verified patient identification, verified procedure, site/side was marked, verified correct patient position, special equipment/implants available, medications/allergies/relevant history reviewed, required imaging and test results available.   Sterile Technique Maximal sterile technique including full sterile barrier drape, hand hygiene, sterile gown, sterile gloves, mask, hair covering, sterile ultrasound probe cover (if used).  Procedure Description Ultrasound was used to identify appropriate pleural anatomy for placement and overlying skin marked.  Area of drainage cleaned and draped in sterile fashion. Lidocaine was used to anesthetize the skin and subcutaneous tissue.  550 cc's of clear yellow  appearing fluid was drained from the right pleural space. Catheter then removed and bandaid applied to site.   Complications/Tolerance None; patient tolerated the procedure well. Chest X-ray is ordered to confirm no post-procedural complication.   EBL Minimal   Specimen(s) Pleural fluid   Erick Colace ACNP-BC [place] Pager # [phone] OR # [phone] if no answer

## 2020-03-17 NOTE — Progress Notes (Signed)
[facility] Infectious Diseases Follow Up Note  Patient Identification: Patient Name: Shannon Rush MRN: [id number] [place] Date: 03/15/2020 10:09 AM Age: 79 y.o.Today's Date: 03/17/2020   Reason for Visit: Follow up on bacteremia   Principal Problem:   Cellulitis Active Problems:   Multiple myeloma ([place])   Acute diastolic CHF (congestive heart failure) ([place])   Acute metabolic encephalopathy   Essential hypertension   Bacteremia due to Pseudomonas  Assessment 71 Y O female with a PMH of multiple myeloma (initially diagnosed in 2013, s/p autologous BMT  In 02/2013 at [facility], on chemotherapy with Cytoxan, Kyprolis, Dexa, last dose on 8/5, follows with Dr. Burr Medico), diastolic heart failure, CAD, pancytopenia, osteoporosis with   1. Pseudomonas bacteremia - Likely source is RT LE cellulitis  - She has a port in her RT chest that was recently replaced  On 8/24. Denies any pain/tenderness. No erythema/swelling and tenderness on exam - TTE5/2014  Large linear mobile target on aorta side of aortic valve & small mobile target on LV side of aortic valve.  - TTE 01/31/2020 Mildly thickened AV and thickened MV leaflets    2. Rt LE Cellulitis  - Looks better in comparison to yesterday. Less erythematous, less painful and less tender.   3. Multiple Myeloma - followed by Hem Onc  Recommendations  Continue cefepime 2 g iv q12 for GNR bacteremia Follow up sensi for Pseudomonas aeruginosa  The TTE done 9/2  is a limited study with a poor quality. Would recommend to get A TTE( complete) given  valvulopathy seen in prior Echo, Pseudomonas bacteremia.and immunocompromised status    Rest of the management as per the primary team. Thank you for the consult. Please page with pertinent questions or concerns.  Dr Linus Salmons is available over the weekend with questions. Dr Tommy Medal will take over ID consult service from coming Tuesday.  Rosiland Oz,  MD Infectious [place] for [facility]   To contact the attending provider between 8:30 A-5P or the covering provider during after hours 5P-8:30A, please log into the web site [email or web address] and access using universal [place] password for that web site. If you do not have the password, please call the hospital operator. ______________________________________________________________________ Subjective patient seen and examined at the bedside. She is in the stepdown unit. Feels comparatively better than yesterday, She feels that her Rt leg also looks better than yesterday. She knows that she is in stepdown unit a [facility]. Overall improving.  Objective BP (!) 95/51 (BP Location: Left Arm)   Pulse 86   Temp (!) 96.8 F (36 C) (Oral)   Resp (!) 24   Ht _0  (1.676 m)   Wt 54.9 kg   SpO2 95%   BMI 19.54 kg/m    Gen: Alert and oriented x 3, not in acute distress, very thin and cachectic  HEENT: [place]/AT, PERLA, no scleral icterus, pale conjunctivae, hearing normal, mucosa moist Neck: Supple, no lymphadenopathy Cardio: RRR, +S1 and S2. Soft systolic murmur Resp: CTAB; no wheezes, rhonchi, or rales GI: Soft, nontender, nondistended, bowel sounds + Musc: No joint swelling or tenderness Extremities: No cyanosis, clubbing, or edema; palpable PT and DP pulses Rt leg- is less erythematous today and less tender  Neuro: grossly nonfocal, kyphosis Psych: Calm, cooperative  Microbiology Results for orders placed or performed during the hospital encounter of 03/15/20  Aerobic Culture (superficial specimen)     Status: Abnormal (Preliminary result)   Collection Time: 03/15/20 10:21 AM   Specimen: Leg  Result Value Ref Range Status   Specimen Description   Final    LEG RIGHT Performed at [facility] [address] [place], [place] [address]    Special Requests   Final    Immunocompromised Performed at [facility], [place]  [address], [place], [place] [address]    Gram Stain   Final    NO WBC SEEN MODERATE GRAM VARIABLE ROD FEW GRAM POSITIVE COCCI FEW GRAM NEGATIVE RODS Performed at [facility], [place] [address], [place], [place] [address]    Culture MULTIPLE ORGANISMS PRESENT, NONE PREDOMINANT (A)  Final   Report Status PENDING  Incomplete  SARS Coronavirus 2 by RT PCR (hospital order, performed in [facility] hospital lab) Nasopharyngeal Nasopharyngeal Swab     Status: None   Collection Time: 03/15/20 10:26 AM   Specimen: Nasopharyngeal Swab  Result Value Ref Range Status   SARS Coronavirus 2 NEGATIVE NEGATIVE Final    Comment: (NOTE) SARS-CoV-2 target nucleic acids are NOT DETECTED.  The SARS-CoV-2 RNA is generally detectable in upper and lower respiratory specimens during the acute phase of infection. The lowest concentration of SARS-CoV-2 viral copies this assay can detect is 250 copies / mL. A negative result does not preclude SARS-CoV-2 infection and should not be used as the sole basis for treatment or other patient management decisions.  A negative result may occur with improper specimen collection / handling, submission of specimen other than nasopharyngeal swab, presence of viral mutation(s) within the areas targeted by this assay, and inadequate number of viral copies (<250 copies / mL). A negative result must be combined with clinical observations, patient history, and epidemiological information.  Fact Sheet for Patients:   StrictlyIdeas.no  Fact Sheet for Healthcare Providers: BankingDealers.co.za  This test is not yet approved or  cleared by the [place] FDA and has been authorized for detection and/or diagnosis of SARS-CoV-2 by FDA under an Emergency Use Authorization (EUA).  This EUA will remain in effect (meaning this test can be used) for the duration of the COVID-19 declaration under Section 564(b)(1) of the Act, 21  U.S.C. section 360bbb-3(b)(1), unless the authorization is terminated or revoked sooner.  Performed at [facility], [place] [address] [place], [place] [address]   Culture, blood (routine x 2)     Status: Abnormal (Preliminary result)   Collection Time: 03/15/20 11:10 AM   Specimen: BLOOD  Result Value Ref Range Status   Specimen Description   Final    BLOOD RIGHT ANTECUBITAL Performed at [facility] [address], [place], [place] [address]    Special Requests   Final    BOTTLES DRAWN AEROBIC AND ANAEROBIC Blood Culture adequate volume Performed at [facility] [address] [place], [place] [address]    Culture  Setup Time   Final    IN BOTH AEROBIC AND ANAEROBIC BOTTLES GRAM NEGATIVE RODS Organism ID to follow CRITICAL RESULT CALLED TO, READ BACK BY AND VERIFIED WITH: Sheffield Slider PHARMD 03/16/20 0600 JDW    Culture (A)  Final    PSEUDOMONAS AERUGINOSA SUSCEPTIBILITIES TO FOLLOW Performed at [facility], [place] [address], [place],  [address]    Report Status PENDING  Incomplete  Blood Culture ID Panel (Reflexed)     Status: Abnormal   Collection Time: 03/15/20 11:10 AM  Result Value Ref Range Status   Enterococcus faecalis NOT DETECTED NOT DETECTED Final   Enterococcus Faecium NOT DETECTED NOT  DETECTED Final   Listeria monocytogenes NOT DETECTED NOT DETECTED Final   Staphylococcus species NOT DETECTED NOT DETECTED Final   Staphylococcus aureus (BCID) NOT DETECTED NOT DETECTED Final   Staphylococcus epidermidis NOT DETECTED NOT DETECTED Final   Staphylococcus lugdunensis NOT DETECTED NOT DETECTED Final   Streptococcus species NOT DETECTED NOT DETECTED Final   Streptococcus agalactiae NOT DETECTED NOT DETECTED Final   Streptococcus pneumoniae NOT DETECTED NOT DETECTED Final   Streptococcus pyogenes NOT DETECTED NOT DETECTED Final   A.calcoaceticus-baumannii NOT DETECTED NOT DETECTED Final   Bacteroides  fragilis NOT DETECTED NOT DETECTED Final   Enterobacterales NOT DETECTED NOT DETECTED Final   Enterobacter cloacae complex NOT DETECTED NOT DETECTED Final   Escherichia coli NOT DETECTED NOT DETECTED Final   Klebsiella aerogenes NOT DETECTED NOT DETECTED Final   Klebsiella oxytoca NOT DETECTED NOT DETECTED Final   Klebsiella pneumoniae NOT DETECTED NOT DETECTED Final   Proteus species NOT DETECTED NOT DETECTED Final   Salmonella species NOT DETECTED NOT DETECTED Final   Serratia marcescens NOT DETECTED NOT DETECTED Final   Haemophilus influenzae NOT DETECTED NOT DETECTED Final   Neisseria meningitidis NOT DETECTED NOT DETECTED Final   Pseudomonas aeruginosa DETECTED (A) NOT DETECTED Final    Comment: CRITICAL RESULT CALLED TO, READ BACK BY AND VERIFIED WITH: Sheffield Slider PHARMD 03/16/20 0600 JDW    Stenotrophomonas maltophilia NOT DETECTED NOT DETECTED Final   Candida albicans NOT DETECTED NOT DETECTED Final   Candida auris NOT DETECTED NOT DETECTED Final   Candida glabrata NOT DETECTED NOT DETECTED Final   Candida krusei NOT DETECTED NOT DETECTED Final   Candida parapsilosis NOT DETECTED NOT DETECTED Final   Candida tropicalis NOT DETECTED NOT DETECTED Final   Cryptococcus neoformans/gattii NOT DETECTED NOT DETECTED Final   CTX-M ESBL NOT DETECTED NOT DETECTED Final   Carbapenem resistance IMP NOT DETECTED NOT DETECTED Final   Carbapenem resistance KPC NOT DETECTED NOT DETECTED Final   Carbapenem resistance NDM NOT DETECTED NOT DETECTED Final   Carbapenem resistance VIM NOT DETECTED NOT DETECTED Final    Comment: Performed at [facility], [address], [place], [place] [address]  MRSA PCR Screening     Status: None   Collection Time: 03/16/20  2:30 PM   Specimen: Nasopharyngeal  Result Value Ref Range Status   MRSA by PCR NEGATIVE NEGATIVE Final    Comment:        The GeneXpert MRSA Assay (FDA approved for NASAL specimens only), is one component of a comprehensive MRSA  colonization surveillance program. It is not intended to diagnose MRSA infection nor to guide or monitor treatment for MRSA infections. Performed at [facility], [place] [address], [place], [place] [address]    *Note: Due to a large number of results and/or encounters for the requested time period, some results have not been displayed. A complete set of results can be found in Results Review.   Pertinent Lab CBC Latest Ref Rng & Units 03/17/2020 03/16/2020 03/15/2020  WBC 4.0 - 10.5 K/uL 4.2 2.3(L) 7.4  Hemoglobin 12.0 - 15.0 g/dL 7.8(L) 8.1(L) 8.8(L)  Hematocrit 36 - 46 % 23.8(L) 24.1(L) 26.9(L)  Platelets 150 - 400 K/uL 98(L) 102(L) 131(L)   CMP Latest Ref Rng & Units 03/17/2020 03/16/2020 03/15/2020  Glucose 70 - 99 mg/dL 180(H) 97 121(H)  BUN 8 - 23 mg/dL 38(H) 31(H) 23  Creatinine 0.44 - 1.00 mg/dL 1.05(H) 1.17(H) 0.80  Sodium 135 - 145 mmol/L 137 138 138  Potassium  3.5 - 5.1 mmol/L 3.3(L) 3.6 3.8  Chloride 98 - 111 mmol/L 106 109 108  CO2 22 - 32 mmol/L 19(L) 19(L) 19(L)  Calcium 8.9 - 10.3 mg/dL 8.4(L) 8.4(L) 9.1  Total Protein 6.5 - 8.1 g/dL - - -  Total Bilirubin 0.3 - 1.2 mg/dL - - -  Alkaline Phos 38 - 126 U/L - - -  AST 15 - 41 U/L - - -  ALT 0 - 44 U/L - - -     Pertinent Imaging today Plain films and CT images have been personally visualized and interpreted; radiology reports have been reviewed. Decision making incorporated into the Impression / Recommendations.  FINDINGS: Cardiovascular: A right-sided venous Port-A-Cath is in place. There is mild calcification of the aortic arch. The subsegmental lower lobe branches of the pulmonary arteries are limited in evaluation. No evidence of pulmonary embolism. There is moderate severity cardiomegaly. No pericardial effusion.  Mediastinum/Nodes: No enlarged mediastinal, hilar, or axillary lymph nodes. A subcentimeter calcified right hilar lymph node is seen. Thyroid gland, trachea, and esophagus demonstrate  no significant findings.  Lungs/Pleura: Mild areas of atelectasis and/or infiltrate are seen within the posterior aspects of the bilateral upper lobes and right lower lobe. Moderate severity left lower lobe atelectasis and/or infiltrate is noted.  There is a moderate size right pleural effusion. A small left pleural effusion is also seen.  No pneumothorax is identified.  Upper Abdomen: No acute abnormality.  Musculoskeletal: Numerous lytic lesions are seen scattered throughout the osseous skeleton.  Chronic fracture deformities are seen involving the sternum, mid to distal left clavicle and multiple bilateral ribs.  Numerous chronic compression fracture deformities are seen throughout the thoracic spine involving the T5 through T9 vertebral bodies and T12 vertebral body.  Moderate severity multilevel degenerative changes are also seen throughout the thoracic spine.  Review of the MIP images confirms the above findings.  IMPRESSION: 1. No evidence of pulmonary embolism. 2. Mild bilateral upper lobes and right lower lobe atelectasis and/or infiltrate. 3. Moderate severity left lower lobe atelectasis and/or infiltrate. 4. Moderate size right pleural effusion. 5. Small left pleural effusion. 6. Numerous lytic lesions scattered throughout the osseous skeleton consistent with multiple myeloma. 7. Numerous chronic compression fracture deformities throughout the thoracic spine. 8. Chronic fracture deformities involving the sternum, mid to distal left clavicle and multiple bilateral ribs. 9. Aortic atherosclerosis.

## 2020-03-17 NOTE — Progress Notes (Addendum)
Patient had 12 beat run SVT and asymptomatic. Stat labs ordered. Notified Dr. Hal Hope. Will continue to monitor.

## 2020-03-17 NOTE — Progress Notes (Signed)
NAME:  Shannon Rush, MRN:  [id number], DOB:  03/01/1941, LOS: 2 ADMISSION DATE:  03/15/2020, CONSULTATION DATE:  03/16/2020 REFERRING MD:  Bonner Puna, CHIEF COMPLAINT: Septic shock  Brief History   79 year old female patient with history of multiple myeloma on chemotherapy admitted on 9/1 with right lower extremity cellulitis and gram-negative rod bacteremia, complicated by newly identified systolic cardiomyopathy.  Pulmonary/critical care asked to see due to ongoing hypotension in spite of conservative volume replacement  History of present illness   79 year old female patient with history of multiple myeloma status post bone marrow transplant followed at [facility] and by Dr. Burr Medico here.  Currently undergoing active chemotherapy.  Presents to the emergency room on 9/1 with chief complaint of fatigue, weakness, and significant right lower extremity pain and lower extremity edema.  She had had no significant fever, chills, shortness of breath, nausea or vomiting however had been more somnolent.  In the emergency room was found to have bicarbonate level 19, BNP level 2409, normal lactic acid, and white blood cell count of 7.4.  She had lower extremity ultrasound these were negative for DVT.  She was initially normotensive but became hypotensive in the emergency room requiring 1 L saline bolus, she was started on vancomycin and Ancef initially in the emergency room with working diagnosis of sepsis, however echocardiogram was also obtained showing new ejection fraction 35 to 40%.  Chest x-ray showed diffuse bilateral airspace disease however she was not requiring supplemental oxygen.  Blood cultures preliminarily suggesting Pseudomonas.  In spite of IV hydration systolic blood pressure still remains in the mid to low 80s in spite of 1 L fluid bolus, at time of consultation we recorded she had received approximately 25.6 mL/kg of her body weight and IV fluid resuscitation.  Because of the mixed picture of what seemed to be  both septic and cardiogenic shock critical care was asked to evaluate  Past Medical History   Past Medical History:  Diagnosis Date  . CAD (coronary artery disease)    a. inf STEMI (in [place] >>> [facility]) >>> LHC (8/15- [place]):  pRCA 60%, mid to dist RCA 50% >>> med rx  . Cardiomyopathy ([place])   . Cataract   . Chronic combined systolic and diastolic CHF (congestive heart failure) ([place])    cancer medication related   . Cord compression ([place]) 07/13/12   MRI- diffuse myeloma involvement of T-L spine  . History of radiation therapy 07/13/12-07/27/12   spinal cord compression T3-T10,left scapula  . Infection 01/29/2020   left leg infection- treated at [facility]  . 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 Multiple myeloma ([place]) 07/01/2012  . MVP (mitral valve prolapse)    a. trivial by echo 06/2017.  . OSTEOPOROSIS 06/11/2010   Multiple compression fractures; and spontaneous fracture of sternum Qualifier: Diagnosis of  By: [name], [name]     . Thoracic kyphosis 07/13/12   per MRI scan  . Unspecified deficiency anemia     Significant Hospital Events   9/1 admitted.  Given 1 L of crystalloid.  Initiated on Ancef and vancomycin.  Remained hypotensive, given fluid challenges over the evening hours 9/2: EF 35 to 40%.  Intermittently encephalopathic.  Systolic blood pressure remaining in the 80s although awake and oriented on time of critical care eval.  Preliminary blood cultures suggest Pseudomonas, antibiotics changed to cefepime.  Critical care consulted.  Consults:  Hematology Cardiology Critical care  Procedures:    Significant Diagnostic Tests:  *Echocardiogram obtained  on 9/2 shows abnormal septal motion/flattening of the inferior wall with hypokinesis.  LV EF estimated 35 to 40% marked RV dysfunction.   Elevated RV size.  Moderately elevated pulmonary artery systolic pressure, left atrial size mildly dilated right atrial size moderately dilated right atrial  pressure estimated at 50 mmHg   Micro Data:  Blood culture-Pseudomonas aeruginosa  Antimicrobials:  Cefepime 9/2>> Meropenem 9/1 Vancomycin 9/1 Interim history/subjective:  No overnight events Stable hemodynamics  Objective   Blood pressure (!) 92/51, pulse 84, temperature (!) 96 F (35.6 C), temperature source Oral, resp. rate (!) 22, height 5' 6"  (1.676 m), weight 54.9 kg, SpO2 96 %.        Intake/Output Summary (Last 24 hours) at 03/17/2020 0909 Last data filed at 03/17/2020 0329 Gross per 24 hour  Intake 442.93 ml  Output --  Net 442.93 ml   Filed Weights   03/15/20 1026 03/17/20 0349  Weight: 51.9 kg 54.9 kg    Examination: General: Elderly lady, frail, does not appear to be in distress  HENT: Moist oral mucosa Lungs: Clear breath sounds bilaterally Cardiovascular: S1-S2 appreciated Abdomen: Bowel sounds appreciated Extremities: Right lower skin is erythematous, painful to the touch Neuro: Awake and alert GU:   Resolved Hospital Problem list     Assessment & Plan:  Severe sepsis in the setting of gram-negative rod bacteremia-Pseudomonas Right lower extremity cellulitis likely cause of sepsis -Continue antibiotics -Follow culture sensitivities  Hypotension is better -Cautious hydration -Midodrine added-continue midodrine to support blood pressure  Systolic cardiomyopathy with ejection fraction of 35 to 40% -Cardiology asked to see patient  Pancytopenia -releted to underlying multiple myeloma -sepsis  Best practice:  Diet: heart healthy  Pain/Anxiety/Delirium protocol (if indicated): not applicale VAP protocol (if indicated): Not indicated DVT prophylaxis: Lovenox Mobility: as tolerated Code Status: dnr Family Communication: defer to primary Disposition:   Labs   CBC: Recent Labs  Lab 03/15/20 1021 03/16/20 0551 03/17/20 0254  WBC 7.4 2.3* 4.2  NEUTROABS 6.3  --   --   HGB 8.8* 8.1* 7.8*  HCT 26.9* 24.1* 23.8*  MCV 106.3* 105.2*  105.8*  PLT 131* 102* 98*    Basic Metabolic Panel: Recent Labs  Lab 03/15/20 1021 03/16/20 1100 03/17/20 0254  NA 138 138 137  K 3.8 3.6 3.3*  CL 108 109 106  CO2 19* 19* 19*  GLUCOSE 121* 97 180*  BUN 23 31* 38*  CREATININE 0.80 1.17* 1.05*  CALCIUM 9.1 8.4* 8.4*  MG  --   --  2.2   GFR: Estimated Creatinine Clearance: 38.9 mL/min (A) (by C-G formula based on SCr of 1.05 mg/dL (H)). Recent Labs  Lab 03/15/20 1021 03/16/20 0551 03/16/20 0957 03/17/20 0254  WBC 7.4 2.3*  --  4.2  LATICACIDVEN 1.7  --  1.8 1.5    Liver Function Tests: No results for input(s): AST, ALT, ALKPHOS, BILITOT, PROT, ALBUMIN in the last 168 hours. No results for input(s): LIPASE, AMYLASE in the last 168 hours. No results for input(s): AMMONIA in the last 168 hours.  ABG    Component Value Date/Time   TCO2 26 06/01/2008 1857     Coagulation Profile: No results for input(s): INR, PROTIME in the last 168 hours.  Cardiac Enzymes: No results for input(s): CKTOTAL, CKMB, CKMBINDEX, TROPONINI in the last 168 hours.  HbA1C: Hemoglobin A1C  Date/Time Value Ref Range Status  12/31/2016 03:35 PM 5.2  Final   Hgb A1c MFr Bld  Date/Time Value Ref Range Status  06/21/2019  08:20 AM 6.3 (H) 4.8 - 5.6 % Final    Comment:    (NOTE) Pre diabetes:          5.7%-6.4% Diabetes:              >6.4% Glycemic control for   <7.0% adults with diabetes     CBG: No results for input(s): GLUCAP in the last 168 hours.  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   Surgical History    Past Surgical History:  Procedure Laterality Date  . APPENDECTOMY    . CATARACT EXTRACTION, BILATERAL    . CESAREAN SECTION     x2   . COLONOSCOPY  2007   neg with Dr. [name]  . ELBOW SURGERY    . HIP SURGERY  2009   left  . IR IMAGING GUIDED PORT INSERTION  03/07/2020  . IR REMOVAL TUN CV CATH W/O FL  02/15/2020  . TUBAL LIGATION       Social History   reports that she has never smoked. She has never used smokeless tobacco. She reports current alcohol use of about 7.0 standard drinks of alcohol per week. She reports that she does not use drugs.   Family History   Her family history includes Cancer in her brother; Glaucoma in her father and mother; Heart disease in her father and mother; Heart failure in her father; Peripheral vascular disease in her mother; Prostate cancer in her brother; Raynaud syndrome in her daughter, daughter, and mother. There is no history of Breast cancer or Colon cancer.   Allergies Allergies  Allergen Reactions  . Zithromax [Azithromycin] Rash    Full body rash  . Zosyn [Piperacillin Sod-Tazobactam So] Swelling and Rash    Itching, rash and swelling of extremities Tolerates Rocephin  . Quinolones Rash    Cipro, Levaquin implicated Cipro also caused vomiting and abd pain     CCM will sign off, call as needed  Sherrilyn Rist, MD [place] [facility] Pager: [phone]

## 2020-03-17 NOTE — Progress Notes (Addendum)
BP dropped to 83/43 map 58. Patient is asymptomatic. Paged Dr. Hal Hope. Stat labs (CBC, lactic) ordered and drawn. Will notify when resulted.   Labs resulted - notified Dr. Hal Hope.   Will continue to monitor.

## 2020-03-17 NOTE — Progress Notes (Addendum)
Progress Note  Patient Name: Shannon Rush Date of Encounter: 03/17/2020  [facility] Cardiologist: Ena Dawley, MD   Subjective   No chest pain, resting in bed, no active SOB   Inpatient Medications    Scheduled Meds: . aspirin EC  81 mg Oral Daily  . atorvastatin  20 mg Oral Daily  . calcium-vitamin D  1 tablet Oral Q breakfast  . Chlorhexidine Gluconate Cloth  6 each Topical Daily  . enoxaparin (LOVENOX) injection  40 mg Subcutaneous Q24H  . hydrocortisone sod succinate (SOLU-CORTEF) inj  50 mg Intravenous Q6H  . mouth rinse  15 mL Mouth Rinse BID  . metoprolol succinate  25 mg Oral Daily  . midodrine  5 mg Oral TID WC  . potassium chloride  40 mEq Oral Once   Continuous Infusions: . sodium chloride 10 mL/hr at 03/17/20 0329  . acyclovir Stopped (03/17/20 0131)  . ceFEPime (MAXIPIME) IV Stopped (03/16/20 2326)   PRN Meds: sodium chloride, acetaminophen, lip balm, metoprolol tartrate, ondansetron, prochlorperazine   Vital Signs    Vitals:   03/17/20 0600 03/17/20 0655 03/17/20 0700 03/17/20 0800  BP: (!) 87/45 (!) 93/44 (!) 98/49 (!) 92/51  Pulse: 77 82 83 84  Resp: 16 19 (!) 22 (!) 22  Temp:    (!) 96 F (35.6 C)  TempSrc:    Oral  SpO2: 98% 94% 93% 96%  Weight:      Height:        Intake/Output Summary (Last 24 hours) at 03/17/2020 0941 Last data filed at 03/17/2020 0329 Gross per 24 hour  Intake 442.93 ml  Output --  Net 442.93 ml   Last 3 Weights 03/17/2020 03/15/2020 03/07/2020  Weight (lbs) 121 lb 0.5 oz 114 lb 6.7 oz 114 lb 6.4 oz  Weight (kg) 54.9 kg 51.9 kg 51.89 kg      Telemetry    Brief runs of SVT - Personally Reviewed  ECG    No new - Personally Reviewed  Physical Exam   GEN: No acute distress.   Neck: + JVD Cardiac: RRR, no murmurs, rubs, or gallops.  Respiratory: few rales in bases to auscultation bilaterally. GI: Soft, nontender, non-distended  MS: some edema Rt leg with some redness; No deformity. Neuro:  Nonfocal   Psych: Normal affect   Labs    High Sensitivity Troponin:   Recent Labs  Lab 03/16/20 1100 03/16/20 1330  TROPONINIHS 86* 90*      Chemistry Recent Labs  Lab 03/15/20 1021 03/16/20 1100 03/17/20 0254  NA 138 138 137  K 3.8 3.6 3.3*  CL 108 109 106  CO2 19* 19* 19*  GLUCOSE 121* 97 180*  BUN 23 31* 38*  CREATININE 0.80 1.17* 1.05*  CALCIUM 9.1 8.4* 8.4*  GFRNONAA >60 45* 51*  GFRAA >60 52* 59*  ANIONGAP _0 Hematology Recent Labs  Lab 03/15/20 1021 03/16/20 0551 03/17/20 0254  WBC 7.4 2.3* 4.2  RBC 2.53* 2.29* 2.25*  HGB 8.8* 8.1* 7.8*  HCT 26.9* 24.1* 23.8*  MCV 106.3* 105.2* 105.8*  MCH 34.8* 35.4* 34.7*  MCHC 32.7 33.6 32.8  RDW 20.1* 20.5* 20.1*  PLT 131* 102* 98*    BNP Recent Labs  Lab 03/15/20 1021  BNP 2,409.6*     DDimer  Recent Labs  Lab 03/16/20 1230  DDIMER 1.64*     Radiology    DG Chest 2 View  Result Date: 03/15/2020 CLINICAL DATA:  Weakness and right  leg pain. EXAM: CHEST - 2 VIEW COMPARISON:  08/18/2018 FINDINGS: Patchy bilateral airspace disease. Cardiomegaly and vascular pedicle widening. There is a small right pleural effusion. No pneumothorax. Porta catheter with tip at the right atrium. Remote spinal, rib, and left clavicle fracture deformities. There is history of multiple myeloma with diffuse demineralization. IMPRESSION: Bilateral airspace disease that could be infection or failure. Small right pleural effusion interval cardiomegaly favors failure. Electronically Signed   By: Monte Fantasia M.D.   On: 03/15/2020 11:05   DG Tibia/Fibula Right  Result Date: 03/15/2020 CLINICAL DATA:  Right leg pain with multiple wounds. Multiple myeloma. EXAM: RIGHT TIBIA AND FIBULA - 2 VIEW COMPARISON:  None. FINDINGS: Nonspecific subcutaneous reticulation. No evidence of fracture. There is generalized osteopenia without visible myeloma in the covered skeleton. IMPRESSION: Generalized soft tissue swelling without acute or malignant  osseous finding. Electronically Signed   By: Monte Fantasia M.D.   On: 03/15/2020 11:06   CT ANGIO CHEST PE W OR WO CONTRAST  Result Date: 03/16/2020 CLINICAL DATA:  Elevated D-dimer and history of multiple myeloma. EXAM: CT ANGIOGRAPHY CHEST WITH CONTRAST TECHNIQUE: Multidetector CT imaging of the chest was performed using the standard protocol during bolus administration of intravenous contrast. Multiplanar CT image reconstructions and MIPs were obtained to evaluate the vascular anatomy. CONTRAST:  69m OMNIPAQUE IOHEXOL 350 MG/ML SOLN COMPARISON:  December 30, 2017 FINDINGS: Cardiovascular: A right-sided venous Port-A-Cath is in place. There is mild calcification of the aortic arch. The subsegmental lower lobe branches of the pulmonary arteries are limited in evaluation. No evidence of pulmonary embolism. There is moderate severity cardiomegaly. No pericardial effusion. Mediastinum/Nodes: No enlarged mediastinal, hilar, or axillary lymph nodes. A subcentimeter calcified right hilar lymph node is seen. Thyroid gland, trachea, and esophagus demonstrate no significant findings. Lungs/Pleura: Mild areas of atelectasis and/or infiltrate are seen within the posterior aspects of the bilateral upper lobes and right lower lobe. Moderate severity left lower lobe atelectasis and/or infiltrate is noted. There is a moderate size right pleural effusion. A small left pleural effusion is also seen. No pneumothorax is identified. Upper Abdomen: No acute abnormality. Musculoskeletal: Numerous lytic lesions are seen scattered throughout the osseous skeleton. Chronic fracture deformities are seen involving the sternum, mid to distal left clavicle and multiple bilateral ribs. Numerous chronic compression fracture deformities are seen throughout the thoracic spine involving the T5 through T9 vertebral bodies and T12 vertebral body. Moderate severity multilevel degenerative changes are also seen throughout the thoracic spine. Review of  the MIP images confirms the above findings. IMPRESSION: 1. No evidence of pulmonary embolism. 2. Mild bilateral upper lobes and right lower lobe atelectasis and/or infiltrate. 3. Moderate severity left lower lobe atelectasis and/or infiltrate. 4. Moderate size right pleural effusion. 5. Small left pleural effusion. 6. Numerous lytic lesions scattered throughout the osseous skeleton consistent with multiple myeloma. 7. Numerous chronic compression fracture deformities throughout the thoracic spine. 8. Chronic fracture deformities involving the sternum, mid to distal left clavicle and multiple bilateral ribs. 9. Aortic atherosclerosis. Aortic Atherosclerosis (ICD10-I70.0). Electronically Signed   By: TVirgina NorfolkM.D.   On: 03/16/2020 16:28   VAS [place]OWER EXTREMITY VENOUS (DVT) (ONLY [facility] & [facility])  Result Date: 03/15/2020  Lower Venous DVTStudy Indications: Edema.  Limitations: Body habitus, poor ultrasound/tissue interface and patient position. Comparison Study: No prior study Performing Technologist: MMaudry MayhewMHA, RDMS, RVT, RDCS  Examination Guidelines: A complete evaluation includes B-mode imaging, spectral Doppler, color Doppler, and power Doppler as needed of all accessible  portions of each vessel. Bilateral testing is considered an integral part of a complete examination. Limited examinations for reoccurring indications may be performed as noted. The reflux portion of the exam is performed with the patient in reverse Trendelenburg.  +---------+---------------+---------+-----------+----------+--------------+ RIGHT    CompressibilityPhasicitySpontaneityPropertiesThrombus Aging +---------+---------------+---------+-----------+----------+--------------+ CFV      Full           No       Yes                                 +---------+---------------+---------+-----------+----------+--------------+ SFJ      Full                                                         +---------+---------------+---------+-----------+----------+--------------+ FV Prox  Full                                                        +---------+---------------+---------+-----------+----------+--------------+ FV Mid   Full                                                        +---------+---------------+---------+-----------+----------+--------------+ FV DistalFull                                                        +---------+---------------+---------+-----------+----------+--------------+ PFV      Full                                                        +---------+---------------+---------+-----------+----------+--------------+ POP      Full           No       Yes                                 +---------+---------------+---------+-----------+----------+--------------+ PTV      Full                                                        +---------+---------------+---------+-----------+----------+--------------+ PERO     Full                                                        +---------+---------------+---------+-----------+----------+--------------+   +---------+---------------+---------+-----------+----------+--------------+ LEFT  CompressibilityPhasicitySpontaneityPropertiesThrombus Aging +---------+---------------+---------+-----------+----------+--------------+ CFV      Full           No       Yes                                 +---------+---------------+---------+-----------+----------+--------------+ SFJ      Full                                                        +---------+---------------+---------+-----------+----------+--------------+ FV Prox  Full                                                        +---------+---------------+---------+-----------+----------+--------------+ FV Mid   Full                                                         +---------+---------------+---------+-----------+----------+--------------+ FV DistalFull                                                        +---------+---------------+---------+-----------+----------+--------------+ PFV      Full                                                        +---------+---------------+---------+-----------+----------+--------------+ POP      Full           No       Yes                                 +---------+---------------+---------+-----------+----------+--------------+ PTV      Full                                                        +---------+---------------+---------+-----------+----------+--------------+ PERO     Full                                                        +---------+---------------+---------+-----------+----------+--------------+     Summary: RIGHT: - There is no evidence of deep vein thrombosis in the lower extremity.  - No cystic structure found in the popliteal fossa.  LEFT: - There is no evidence of deep vein thrombosis in the lower extremity.  - A cystic structure is  found in the popliteal fossa.  Bilateral lower extremity veins exhibit pulsatile flow, suggestive of possibly elevated right heart pressure.  *See table(s) above for measurements and observations. Electronically signed by Curt Jews MD on 03/15/2020 at 3:12:07 PM.    Final    ECHOCARDIOGRAM LIMITED  Result Date: 03/16/2020    ECHOCARDIOGRAM LIMITED REPORT   Patient Name:   CAROLJEAN MONSIVAIS Date of Exam: 03/16/2020 Medical Rec #:  [id number]      Height:       66.0 in Accession #:    [id number]     Weight:       114.4 lb Date of Birth:  December 23, 1940     BSA:          1.577 m Patient Age:    79 years       BP:           74/50 mmHg Patient Gender: F              HR:           78 bpm. Exam Location:  Inpatient Procedure: Limited Echo, Color Doppler and Cardiac Doppler Indications:    X50.56 Acute diastolic (congestive) heart failure  History:        Patient has  prior history of Echocardiogram examinations, most                 recent 11/08/2019. CHF; Risk Factors:Hypertension.  Sonographer:    Raquel Sarna Senior RDCS Referring Phys: [id number] Harold Hedge  Sonographer Comments: Technically difficult due to small rib spacing. IMPRESSIONS  1. Abnormal septal motion/flattening inferior wall hypokinesis . Left ventricular ejection fraction, by estimation, is 35 to 40%. The left ventricle has moderately decreased function. The left ventricle demonstrates regional wall motion abnormalities (see scoring diagram/findings for description). The left ventricular internal cavity size was mildly dilated. Left ventricular diastolic parameters are indeterminate.  2. Marked RV dysfunction . Right ventricular systolic function is severely reduced. The right ventricular size is moderately enlarged. There is moderately elevated pulmonary artery systolic pressure.  3. Left atrial size was moderately dilated.  4. Right atrial size was moderately dilated.  5. Mild mitral valve regurgitation.  6. Tricuspid valve regurgitation is moderate.  7. The aortic valve is tricuspid. Aortic valve regurgitation is trivial. Mild to moderate aortic valve sclerosis/calcification is present, without any evidence of aortic stenosis.  8. The inferior vena cava is dilated in size with <50% respiratory variability, suggesting right atrial pressure of 15 mmHg. FINDINGS  Left Ventricle: Abnormal septal motion/flattening inferior wall hypokinesis. Left ventricular ejection fraction, by estimation, is 35 to 40%. The left ventricle has moderately decreased function. The left ventricle demonstrates regional wall motion abnormalities. The left ventricular internal cavity size was mildly dilated. Right Ventricle: Marked RV dysfunction. The right ventricular size is moderately enlarged. Right vetricular wall thickness was not assessed. Right ventricular systolic function is severely reduced. There is moderately elevated pulmonary  artery systolic pressure. The tricuspid regurgitant velocity is 2.78 m/s, and with an assumed right atrial pressure of 15 mmHg, the estimated right ventricular systolic pressure is 65.5 mmHg. Left Atrium: Left atrial size was moderately dilated. Right Atrium: Right atrial size was moderately dilated. Pericardium: There is no evidence of pericardial effusion. Mitral Valve: There is mild thickening of the mitral valve leaflet(s). There is mild calcification of the mitral valve leaflet(s). Mild mitral valve regurgitation. Tricuspid Valve: Tricuspid valve regurgitation is moderate. Aortic Valve: The aortic valve is tricuspid. Aortic valve regurgitation is  trivial. Mild to moderate aortic valve sclerosis/calcification is present, without any evidence of aortic stenosis. Pulmonic Valve: The pulmonic valve was not well visualized. Venous: The inferior vena cava is dilated in size with less than 50% respiratory variability, suggesting right atrial pressure of 15 mmHg. LEFT VENTRICLE PLAX 2D LVOT diam:     1.90 cm LV SV:         41 LV SV Index:   26 LVOT Area:     2.84 cm  LV Volumes (MOD) LV vol d, MOD A2C: 115.0 ml LV vol d, MOD A4C: 94.5 ml LV vol s, MOD A2C: 69.0 ml LV vol s, MOD A4C: 63.3 ml LV SV MOD A2C:     46.0 ml LV SV MOD A4C:     94.5 ml LV SV MOD BP:      35.1 ml RIGHT VENTRICLE RV S prime:     5.44 cm/s TAPSE (M-mode): 1.0 cm AORTIC VALVE LVOT Vmax:   75.10 cm/s LVOT Vmean:  53.400 cm/s LVOT VTI:    0.146 m TRICUSPID VALVE TR Peak grad:   30.9 mmHg TR Vmax:        278.00 cm/s  SHUNTS Systemic VTI:  0.15 m Systemic Diam: 1.90 cm Jenkins Rouge MD Electronically signed by Jenkins Rouge MD Signature Date/Time: 03/16/2020/8:46:18 AM    Final     Cardiac Studies   Limited Echo 03/16/20  IMPRESSIONS    1. Abnormal septal motion/flattening inferior wall hypokinesis . Left  ventricular ejection fraction, by estimation, is 35 to 40%. The left  ventricle has moderately decreased function. The left ventricle    demonstrates regional wall motion abnormalities  (see scoring diagram/findings for description). The left ventricular  internal cavity size was mildly dilated. Left ventricular diastolic  parameters are indeterminate.  2. Marked RV dysfunction . Right ventricular systolic function is  severely reduced. The right ventricular size is moderately enlarged. There  is moderately elevated pulmonary artery systolic pressure.  3. Left atrial size was moderately dilated.  4. Right atrial size was moderately dilated.  5. Mild mitral valve regurgitation.  6. Tricuspid valve regurgitation is moderate.  7. The aortic valve is tricuspid. Aortic valve regurgitation is trivial.  Mild to moderate aortic valve sclerosis/calcification is present, without  any evidence of aortic stenosis.  8. The inferior vena cava is dilated in size with <50% respiratory  variability, suggesting right atrial pressure of 15 mmHg.   FINDINGS  Left Ventricle: Abnormal septal motion/flattening inferior wall  hypokinesis. Left ventricular ejection fraction, by estimation, is 35 to  40%. The left ventricle has moderately decreased function. The left  ventricle demonstrates regional wall motion  abnormalities. The left ventricular internal cavity size was mildly  dilated.   Right Ventricle: Marked RV dysfunction. The right ventricular size is  moderately enlarged. Right vetricular wall thickness was not assessed.  Right ventricular systolic function is severely reduced. There is  moderately elevated pulmonary artery systolic  pressure. The tricuspid regurgitant velocity is 2.78 m/s, and with an  assumed right atrial pressure of 15 mmHg, the estimated right ventricular  systolic pressure is 06.3 mmHg.   Left Atrium: Left atrial size was moderately dilated.   Right Atrium: Right atrial size was moderately dilated.   Pericardium: There is no evidence of pericardial effusion.   Mitral Valve: There is mild  thickening of the mitral valve leaflet(s).  There is mild calcification of the mitral valve leaflet(s). Mild mitral  valve regurgitation.   Tricuspid Valve: Tricuspid valve regurgitation  is moderate.   Aortic Valve: The aortic valve is tricuspid. Aortic valve regurgitation is  trivial. Mild to moderate aortic valve sclerosis/calcification is present,  without any evidence of aortic stenosis.   Pulmonic Valve: The pulmonic valve was not well visualized.   Venous: The inferior vena cava is dilated in size with less than 50%  respiratory variability, suggesting right atrial pressure of 15 mmHg.   LEFT VENTRICLE  PLAX 2D  LVOT diam:   1.90 cm  LV SV:     41  LV SV Index:  26  LVOT Area:   2.84 cm    LV Volumes (MOD)  LV vol d, MOD A2C: 115.0 ml  LV vol d, MOD A4C: 94.5 ml  LV vol s, MOD A2C: 69.0 ml  LV vol s, MOD A4C: 63.3 ml  LV SV MOD A2C:   46.0 ml  LV SV MOD A4C:   94.5 ml  LV SV MOD BP:   35.1 ml  Patient Profile     79 y.o. female with a hx of multiple myeloma with hx of bone marrow transplant followed by [facility] and by Dr. Burr Medico here, CAD with inf STEMI w/p thrombolytic 02/2014, combined CHF possible chemo induced   now with sepsis, hypotension and Biventricular heart failure  Assessment & Plan    Combined and Biventricular CHF. Her BNP is elevated but has been higher.  RV dysfunction new from July and neg for PE on CTA. + pl effusion on Rt.  --I&O not noted just intake, wt is up from 51.9 Kg to 54.9  --pl effusion, Dr. Percival Spanish talked with CCM and plan for tap today   Hypotension BB on hold  Was given NS for 1 L and albumin X 2  BP73/33 to 109/65 Midodrine was added  May need milrinone if BP remains low after pl tap  PSVT on initial EKG and now on monitor with 8-11 beats of SVT twice since admit.  - concern for increase off BB    Cellulitis with pseudomonas bacteremia, ID ordered repeat TTE - for now on hold, may be more beneficial to do TEE.  Defer to  Dr. Percival Spanish per Dr. Sallyanne Kuster.  Echo was done yesterday  CAD [place]. Cath with non obstructive CAD of RCA, other vessels patent  --hs troponin 86 and 90-most likely due to hypotension, denies chest pain  EKG similar to prior with some deeper T wave inversions  Multiple myeloma on chemo with leukopenia and thrombocytopenia per oncology   --hgb to 7.8       For questions or updates, please contact [facility] Please consult [email or web address] for contact info under        Signed, Cecilie Kicks, NP  03/17/2020, 9:41 AM     History and all data above reviewed.  Patient examined.    She actually looks pretty good today and is breathing Ok and says that she has less leg pain.  She is eating.  She remains hypotensive.  Although it is rebound slightly currently.  She is oxygenating well on RA and is afebrile but with some increased respiratory rate.   I agree with the findings as above.  The patient exam reveals COR:RRR  ,  Lungs: Decreased breath sounds right base  ,  Abd: Positive bowel sounds, no rebound no guarding, Ext No edema  .  All available labs, radiology testing, previous records reviewed. Agree with documented assessment and plan.  Acute RV failure:  This is a new finding.  No evidence of PE CT.  Unclear the etiology or acuity.  It was not present on echo in July.  If she remains hypotensive and doesn't slowly improve as we control her infection we could start milrinone later today given the evidence of LV dysfunction and left sided failure as well.  Bacteremia: TTE this admission did not suggest previous valve lesion (thought to be benign) noted at [facility].  However, we could consider TEE to further evaluate.  At this point I think she is too frail to consider transfer for this today.  Acute systolic HF:  She does have some evidence of increased edema on CXR.  However, she is oxygenating well and would not tolerate diuresis.  Given what  is clearly a low margin to tolerate excess fluid we could consider thoracentesis of her moderate to large right sided effusion.    Shasha Buchbinder  1:15 PM  03/17/2020

## 2020-03-18 DIAGNOSIS — I471 Supraventricular tachycardia: Secondary | ICD-10-CM

## 2020-03-18 DIAGNOSIS — Z7189 Other specified counseling: Secondary | ICD-10-CM

## 2020-03-18 DIAGNOSIS — Z515 Encounter for palliative care: Secondary | ICD-10-CM

## 2020-03-18 DIAGNOSIS — R531 Weakness: Secondary | ICD-10-CM

## 2020-03-18 DIAGNOSIS — I5041 Acute combined systolic (congestive) and diastolic (congestive) heart failure: Secondary | ICD-10-CM

## 2020-03-18 LAB — CBC WITH DIFFERENTIAL/PLATELET
Abs Immature Granulocytes: 0.11 10*3/uL — ABNORMAL HIGH (ref 0.00–0.07)
Basophils Absolute: 0 10*3/uL (ref 0.0–0.1)
Basophils Relative: 1 %
Eosinophils Absolute: 0 10*3/uL (ref 0.0–0.5)
Eosinophils Relative: 0 %
HCT: 22 % — ABNORMAL LOW (ref 36.0–46.0)
Hemoglobin: 7.1 g/dL — ABNORMAL LOW (ref 12.0–15.0)
Immature Granulocytes: 2 %
Lymphocytes Relative: 1 %
Lymphs Abs: 0.1 10*3/uL — ABNORMAL LOW (ref 0.7–4.0)
MCH: 34.1 pg — ABNORMAL HIGH (ref 26.0–34.0)
MCHC: 32.3 g/dL (ref 30.0–36.0)
MCV: 105.8 fL — ABNORMAL HIGH (ref 80.0–100.0)
Monocytes Absolute: 0.5 10*3/uL (ref 0.1–1.0)
Monocytes Relative: 8 %
Neutro Abs: 5.4 10*3/uL (ref 1.7–7.7)
Neutrophils Relative %: 88 %
Platelets: 98 10*3/uL — ABNORMAL LOW (ref 150–400)
RBC: 2.08 MIL/uL — ABNORMAL LOW (ref 3.87–5.11)
RDW: 19.8 % — ABNORMAL HIGH (ref 11.5–15.5)
WBC: 6.1 10*3/uL (ref 4.0–10.5)
nRBC: 0 % (ref 0.0–0.2)

## 2020-03-18 LAB — CULTURE, BLOOD (ROUTINE X 2): Special Requests: ADEQUATE

## 2020-03-18 LAB — BASIC METABOLIC PANEL
Anion gap: 9 (ref 5–15)
BUN: 47 mg/dL — ABNORMAL HIGH (ref 8–23)
CO2: 20 mmol/L — ABNORMAL LOW (ref 22–32)
Calcium: 8.8 mg/dL — ABNORMAL LOW (ref 8.9–10.3)
Chloride: 111 mmol/L (ref 98–111)
Creatinine, Ser: 0.94 mg/dL (ref 0.44–1.00)
GFR calc Af Amer: >60 mL/min (ref >60)
GFR calc non Af Amer: 59 mL/min — ABNORMAL LOW (ref >60)
Glucose, Bld: 143 mg/dL — ABNORMAL HIGH (ref 70–99)
Potassium: 4 mmol/L (ref 3.5–5.1)
Sodium: 140 mmol/L (ref 135–145)

## 2020-03-18 LAB — MAGNESIUM: Magnesium: 2.4 mg/dL (ref 1.7–2.4)

## 2020-03-18 MED ORDER — MORPHINE SULFATE (PF) 2 MG/ML IV SOLN
1.0000 mg | INTRAVENOUS | Status: DC | PRN
  Administered 2020-03-18 – 2020-03-19 (×4): 1 mg via INTRAVENOUS
  Filled 2020-03-18 (×4): qty 1

## 2020-03-18 MED ORDER — SODIUM CHLORIDE 0.9% FLUSH: 10.0000 mL | INTRAVENOUS | Status: DC | PRN

## 2020-03-18 MED ORDER — SODIUM CHLORIDE 0.9% FLUSH
10.0000 mL | Freq: Two times a day (BID) | INTRAVENOUS | Status: DC
  Administered 2020-03-18 – 2020-03-19 (×3): 10 mL

## 2020-03-18 MED ORDER — LORAZEPAM 2 MG/ML IJ SOLN: 0.5000 mg | INTRAMUSCULAR | Status: DC | PRN

## 2020-03-18 NOTE — Progress Notes (Addendum)
Progress Note  Patient Name: Shannon Rush Date of Encounter: 03/18/2020  [facility] Cardiologist: Ena Dawley, MD   Subjective   Patient refusing treatment this morning, stating she wants palliative care.  She is alert and oriented x3.  Inpatient Medications    Scheduled Meds:  aspirin EC  81 mg Oral Daily   atorvastatin  20 mg Oral Daily   calcium-vitamin D  1 tablet Oral Q breakfast   Chlorhexidine Gluconate Cloth  6 each Topical Daily   hydrocortisone sod succinate (SOLU-CORTEF) inj  50 mg Intravenous Q6H   mouth rinse  15 mL Mouth Rinse BID   metoprolol succinate  25 mg Oral Daily   midodrine  5 mg Oral TID WC   Continuous Infusions:  sodium chloride Stopped (03/17/20 2241)   acyclovir Stopped (03/18/20 0039)   ceFEPime (MAXIPIME) IV Stopped (03/17/20 2311)   PRN Meds: sodium chloride, acetaminophen, lip balm, metoprolol tartrate, ondansetron, prochlorperazine   Vital Signs    Vitals:   03/18/20 0200 03/18/20 0300 03/18/20 0400 03/18/20 0500  BP: (!) 81/48 (!) 85/45 (!) 89/47 (!) 89/48  Pulse: 81 83 82 86  Resp: 18 (!) 24 20 (!) 25  Temp:   97.8 F (36.6 C)   TempSrc:   Oral   SpO2: 92% 93% 94% 94%  Weight:    54.3 kg  Height:        Intake/Output Summary (Last 24 hours) at 03/18/2020 0727 Last data filed at 03/18/2020 0300 Gross per 24 hour  Intake 636.2 ml  Output 400 ml  Net 236.2 ml   Last 3 Weights 03/18/2020 03/17/2020 03/15/2020  Weight (lbs) 119 lb 11.4 oz 121 lb 0.5 oz 114 lb 6.7 oz  Weight (kg) 54.3 kg 54.9 kg 51.9 kg      Telemetry    NSR, rate 80s, brief run of SVT- Personally Reviewed  ECG    No new - Personally Reviewed  Physical Exam   GEN: No acute distress.   Neck: + JVD Cardiac: RRR, no murmurs, rubs, or gallops.  Respiratory: bibasilar rales GI: Soft, nontender, non-distended  MS: some edema Rt leg with some redness; No deformity. Neuro:  Nonfocal .  A&Ox3 Psych: Normal affect   Labs    High Sensitivity  Troponin:   Recent Labs  Lab 03/16/20 1100 03/16/20 1330  TROPONINIHS 86* 90*      Chemistry Recent Labs  Lab 03/16/20 1100 03/17/20 0254 03/17/20 1320 03/18/20 0523  NA 138 137  --  140  K 3.6 3.3*  --  4.0  CL 109 106  --  111  CO2 19* 19*  --  20*  GLUCOSE 97 180*  --  143*  BUN 31* 38*  --  47*  CREATININE 1.17* 1.05*  --  0.94  CALCIUM 8.4* 8.4*  --  8.8*  PROT  --   --  6.0*  --   GFRNONAA 45* 51*  --  59*  GFRAA 52* 59*  --  >60  ANIONGAP 10 12  --  9     Hematology Recent Labs  Lab 03/15/20 1021 03/16/20 0551 03/17/20 0254  WBC 7.4 2.3* 4.2  RBC 2.53* 2.29* 2.25*  HGB 8.8* 8.1* 7.8*  HCT 26.9* 24.1* 23.8*  MCV 106.3* 105.2* 105.8*  MCH 34.8* 35.4* 34.7*  MCHC 32.7 33.6 32.8  RDW 20.1* 20.5* 20.1*  PLT 131* 102* 98*    BNP Recent Labs  Lab 03/15/20 1021  BNP 2,409.6*  DDimer  Recent Labs  Lab 03/16/20 1230  DDIMER 1.64*     Radiology    CT ANGIO CHEST PE W OR WO CONTRAST  Result Date: 03/16/2020 CLINICAL DATA:  Elevated D-dimer and history of multiple myeloma. EXAM: CT ANGIOGRAPHY CHEST WITH CONTRAST TECHNIQUE: Multidetector CT imaging of the chest was performed using the standard protocol during bolus administration of intravenous contrast. Multiplanar CT image reconstructions and MIPs were obtained to evaluate the vascular anatomy. CONTRAST:  75m OMNIPAQUE IOHEXOL 350 MG/ML SOLN COMPARISON:  December 30, 2017 FINDINGS: Cardiovascular: A right-sided venous Port-A-Cath is in place. There is mild calcification of the aortic arch. The subsegmental lower lobe branches of the pulmonary arteries are limited in evaluation. No evidence of pulmonary embolism. There is moderate severity cardiomegaly. No pericardial effusion. Mediastinum/Nodes: No enlarged mediastinal, hilar, or axillary lymph nodes. A subcentimeter calcified right hilar lymph node is seen. Thyroid gland, trachea, and esophagus demonstrate no significant findings. Lungs/Pleura: Mild areas  of atelectasis and/or infiltrate are seen within the posterior aspects of the bilateral upper lobes and right lower lobe. Moderate severity left lower lobe atelectasis and/or infiltrate is noted. There is a moderate size right pleural effusion. A small left pleural effusion is also seen. No pneumothorax is identified. Upper Abdomen: No acute abnormality. Musculoskeletal: Numerous lytic lesions are seen scattered throughout the osseous skeleton. Chronic fracture deformities are seen involving the sternum, mid to distal left clavicle and multiple bilateral ribs. Numerous chronic compression fracture deformities are seen throughout the thoracic spine involving the T5 through T9 vertebral bodies and T12 vertebral body. Moderate severity multilevel degenerative changes are also seen throughout the thoracic spine. Review of the MIP images confirms the above findings. IMPRESSION: 1. No evidence of pulmonary embolism. 2. Mild bilateral upper lobes and right lower lobe atelectasis and/or infiltrate. 3. Moderate severity left lower lobe atelectasis and/or infiltrate. 4. Moderate size right pleural effusion. 5. Small left pleural effusion. 6. Numerous lytic lesions scattered throughout the osseous skeleton consistent with multiple myeloma. 7. Numerous chronic compression fracture deformities throughout the thoracic spine. 8. Chronic fracture deformities involving the sternum, mid to distal left clavicle and multiple bilateral ribs. 9. Aortic atherosclerosis. Aortic Atherosclerosis (ICD10-I70.0). Electronically Signed   By: TVirgina NorfolkM.D.   On: 03/16/2020 16:28   DG Chest Port 1 View  Result Date: 03/17/2020 CLINICAL DATA:  Status post thoracentesis. EXAM: PORTABLE CHEST 1 VIEW COMPARISON:  March 15, 2020. FINDINGS: Stable cardiomegaly. No pneumothorax is noted. Stable right-sided Port-A-Cath. Mild bibasilar subsegmental atelectasis is noted with small left pleural effusion. Old left rib fractures are noted.  IMPRESSION: Mild bibasilar subsegmental atelectasis with small left pleural effusion. No pneumothorax is noted. Electronically Signed   By: JMarijo ConceptionM.D.   On: 03/17/2020 13:16   ECHOCARDIOGRAM LIMITED  Result Date: 03/16/2020    ECHOCARDIOGRAM LIMITED REPORT   Patient Name:   CGABY HARNEYDate of Exam: 03/16/2020 Medical Rec #:  [id number]     Height:       66.0 in Accession #:    [id number]    Weight:       114.4 lb Date of Birth:  1Jan 09, 1942    BSA:          1.577 m Patient Age:    89years       BP:           74/50 mmHg Patient Gender: F  HR:           78 bpm. Exam Location:  Inpatient Procedure: Limited Echo, Color Doppler and Cardiac Doppler Indications:    A25.05 Acute diastolic (congestive) heart failure  History:        Patient has prior history of Echocardiogram examinations, most                 recent 11/08/2019. CHF; Risk Factors:Hypertension.  Sonographer:    Raquel Sarna Senior RDCS Referring Phys: [id number] Harold Hedge  Sonographer Comments: Technically difficult due to small rib spacing. IMPRESSIONS  1. Abnormal septal motion/flattening inferior wall hypokinesis . Left ventricular ejection fraction, by estimation, is 35 to 40%. The left ventricle has moderately decreased function. The left ventricle demonstrates regional wall motion abnormalities (see scoring diagram/findings for description). The left ventricular internal cavity size was mildly dilated. Left ventricular diastolic parameters are indeterminate.  2. Marked RV dysfunction . Right ventricular systolic function is severely reduced. The right ventricular size is moderately enlarged. There is moderately elevated pulmonary artery systolic pressure.  3. Left atrial size was moderately dilated.  4. Right atrial size was moderately dilated.  5. Mild mitral valve regurgitation.  6. Tricuspid valve regurgitation is moderate.  7. The aortic valve is tricuspid. Aortic valve regurgitation is trivial. Mild to moderate aortic valve  sclerosis/calcification is present, without any evidence of aortic stenosis.  8. The inferior vena cava is dilated in size with <50% respiratory variability, suggesting right atrial pressure of 15 mmHg. FINDINGS  Left Ventricle: Abnormal septal motion/flattening inferior wall hypokinesis. Left ventricular ejection fraction, by estimation, is 35 to 40%. The left ventricle has moderately decreased function. The left ventricle demonstrates regional wall motion abnormalities. The left ventricular internal cavity size was mildly dilated. Right Ventricle: Marked RV dysfunction. The right ventricular size is moderately enlarged. Right vetricular wall thickness was not assessed. Right ventricular systolic function is severely reduced. There is moderately elevated pulmonary artery systolic pressure. The tricuspid regurgitant velocity is 2.78 m/s, and with an assumed right atrial pressure of 15 mmHg, the estimated right ventricular systolic pressure is 19.3 mmHg. Left Atrium: Left atrial size was moderately dilated. Right Atrium: Right atrial size was moderately dilated. Pericardium: There is no evidence of pericardial effusion. Mitral Valve: There is mild thickening of the mitral valve leaflet(s). There is mild calcification of the mitral valve leaflet(s). Mild mitral valve regurgitation. Tricuspid Valve: Tricuspid valve regurgitation is moderate. Aortic Valve: The aortic valve is tricuspid. Aortic valve regurgitation is trivial. Mild to moderate aortic valve sclerosis/calcification is present, without any evidence of aortic stenosis. Pulmonic Valve: The pulmonic valve was not well visualized. Venous: The inferior vena cava is dilated in size with less than 50% respiratory variability, suggesting right atrial pressure of 15 mmHg. LEFT VENTRICLE PLAX 2D LVOT diam:     1.90 cm LV SV:         41 LV SV Index:   26 LVOT Area:     2.84 cm  LV Volumes (MOD) LV vol d, MOD A2C: 115.0 ml LV vol d, MOD A4C: 94.5 ml LV vol s, MOD A2C:  69.0 ml LV vol s, MOD A4C: 63.3 ml LV SV MOD A2C:     46.0 ml LV SV MOD A4C:     94.5 ml LV SV MOD BP:      35.1 ml RIGHT VENTRICLE RV S prime:     5.44 cm/s TAPSE (M-mode): 1.0 cm AORTIC VALVE LVOT Vmax:   75.10 cm/s LVOT Vmean:  53.400 cm/s LVOT VTI:    0.146 m TRICUSPID VALVE TR Peak grad:   30.9 mmHg TR Vmax:        278.00 cm/s  SHUNTS Systemic VTI:  0.15 m Systemic Diam: 1.90 cm Jenkins Rouge MD Electronically signed by Jenkins Rouge MD Signature Date/Time: 03/16/2020/8:46:18 AM    Final     Cardiac Studies   Limited Echo 03/16/20  IMPRESSIONS    1. Abnormal septal motion/flattening inferior wall hypokinesis . Left  ventricular ejection fraction, by estimation, is 35 to 40%. The left  ventricle has moderately decreased function. The left ventricle  demonstrates regional wall motion abnormalities  (see scoring diagram/findings for description). The left ventricular  internal cavity size was mildly dilated. Left ventricular diastolic  parameters are indeterminate.  2. Marked RV dysfunction . Right ventricular systolic function is  severely reduced. The right ventricular size is moderately enlarged. There  is moderately elevated pulmonary artery systolic pressure.  3. Left atrial size was moderately dilated.  4. Right atrial size was moderately dilated.  5. Mild mitral valve regurgitation.  6. Tricuspid valve regurgitation is moderate.  7. The aortic valve is tricuspid. Aortic valve regurgitation is trivial.  Mild to moderate aortic valve sclerosis/calcification is present, without  any evidence of aortic stenosis.  8. The inferior vena cava is dilated in size with <50% respiratory  variability, suggesting right atrial pressure of 15 mmHg.   FINDINGS  Left Ventricle: Abnormal septal motion/flattening inferior wall  hypokinesis. Left ventricular ejection fraction, by estimation, is 35 to  40%. The left ventricle has moderately decreased function. The left  ventricle demonstrates  regional wall motion  abnormalities. The left ventricular internal cavity size was mildly  dilated.   Right Ventricle: Marked RV dysfunction. The right ventricular size is  moderately enlarged. Right vetricular wall thickness was not assessed.  Right ventricular systolic function is severely reduced. There is  moderately elevated pulmonary artery systolic  pressure. The tricuspid regurgitant velocity is 2.78 m/s, and with an  assumed right atrial pressure of 15 mmHg, the estimated right ventricular  systolic pressure is 09.4 mmHg.   Left Atrium: Left atrial size was moderately dilated.   Right Atrium: Right atrial size was moderately dilated.   Pericardium: There is no evidence of pericardial effusion.   Mitral Valve: There is mild thickening of the mitral valve leaflet(s).  There is mild calcification of the mitral valve leaflet(s). Mild mitral  valve regurgitation.   Tricuspid Valve: Tricuspid valve regurgitation is moderate.   Aortic Valve: The aortic valve is tricuspid. Aortic valve regurgitation is  trivial. Mild to moderate aortic valve sclerosis/calcification is present,  without any evidence of aortic stenosis.   Pulmonic Valve: The pulmonic valve was not well visualized.   Venous: The inferior vena cava is dilated in size with less than 50%  respiratory variability, suggesting right atrial pressure of 15 mmHg.   LEFT VENTRICLE  PLAX 2D  LVOT diam:   1.90 cm  LV SV:     41  LV SV Index:  26  LVOT Area:   2.84 cm    LV Volumes (MOD)  LV vol d, MOD A2C: 115.0 ml  LV vol d, MOD A4C: 94.5 ml  LV vol s, MOD A2C: 69.0 ml  LV vol s, MOD A4C: 63.3 ml  LV SV MOD A2C:   46.0 ml  LV SV MOD A4C:   94.5 ml  LV SV MOD BP:   35.1 ml  Patient Profile  79 y.o. female with a hx of multiple myeloma with hx of bone marrow transplant followed by [facility] and by Dr. Burr Medico here, CAD with inf STEMI w/p thrombolytic 02/2014, combined CHF possible chemo induced   now  with sepsis, hypotension and Biventricular heart failure  Assessment & Plan    Acute Combined and Biventricular CHF: TTE 03/16/20 with EF 35-40%, severe RV dysfunction.  CTPA showed no PE.  Could represent stress cardiomyopathy in setting of sepsis with pseudomonas bacteremia.  Remains hypotensive, there was discussion of starting milrinone but  she is warm on exam and if her hypotension is from sepsis, milrinone would not be beneficial and would only worsen her hypotension.  If aggressive care being pursued, would favor placing CVC and checking CVP and Co-ox to evaluate cardiogenic versus septic etiology.  SVT: holding beta blocker given hypotension.  Brief runs of SVT.  Sepsis: pseudomonas bacteremia, likely 2/2 cellulitis.  ID recommending complete TTE to evaluate valves, prior was limited to evaluate systolic function  CAD: [place]. Cath with non obstructive CAD of RCA, other vessels patent.  Hs troponin 86 and 90-most likely demand ischemia   Multiple myeloma on chemo with leukopenia and thrombocytopenia per oncology. Hgb to 7.1   Goals of care: patient currently stating she wants comfort measures only and is declining treatments.  Recommend discussion with palliative care     For questions or updates, please contact [place] Please consult [email or web address] for contact info under        Signed, Donato Heinz, MD  03/18/2020, 7:27 AM

## 2020-03-18 NOTE — Progress Notes (Signed)
Pt was retaining urine, bladder scan was 680 mL at 0600. In and out cath attempted x 2, pt was agitated and asking [place] to stop.   pt stated "why are you doing this to me when I am going to die tomorrow?"  Pt refused another attempt, pt requested to be made comfortable and to "say goodbye to her family".

## 2020-03-18 NOTE — Progress Notes (Signed)
PT Cancellation Note  Patient Details Name: JULIYA MAGILL MRN: [id number] DOB: [birth year]   Cancelled Treatment:     PT deferred this date with Hgb 7.1, BP 89/48 and palliative consult noted.  Will follow.   Jannine Abreu 03/18/2020, 12:48 PM

## 2020-03-18 NOTE — Progress Notes (Signed)
PROGRESS NOTE  Shannon Rush  URK:[id number] DOB: 07-Jun-1941 DOA: 03/15/2020 PCP: [name], MD   Brief Narrative: This is a 79 year old female with past medical history of multiple myeloma on Cytoxan, Kyprolis, Dexa (follows with Dr. Burr Medico), diastolic heart failure (EF 55 to 60% in April), CAD, inferior STEMI in 2015, pancytopenia, osteoporosis who was admitted to [facility] on 7/15 for left lower extremity cellulitis and developed right leg edema on 7/30 with a negative Doppler at the time and was put on Lasix for swelling which initially improved who has had increasing pain and swelling of the RLE over the past few days as well as fatigue.  No relief despite home morphine.  Had noted some weeping from her extremity.  no fever or chills or any other complaint.   Upon arrival to ED, she was hemodynamically stable on room air.  Notable labs: Bicarb 19, glucose 121, BNP 2409, lactic acid 1.7, WBC 7.4, Hb 8.8, platelets 131, COVID-19 negative.  Bilateral lower extremity ultrasound: Negative for DVT but suggestive of possible elevated right heart pressure.  Received 1 L NS bolus for infection and possible sepsis however CXR was concerning for probable heart failure and further fluids were discontinued.  Was given vancomycin and Ancef x1"  She was admitted under hospitalist service and subsequently her blood cultures identified Pseudomonas, she developed hypotension which worsened, echocardiogram revealed RV and LV systolic dysfunction.  Due to hypotension, PCCM was consulted and she was moved to stepdown unit.  She was started on midodrine.  Found to have right-sided pleural effusion for which he underwent thoracentesis with retrieval of 550 cc mL.  Continue to have persistent hypotension with no improvement even after thoracentesis.  She did not require any vasopressors.  She improved with her mentation and her cellulitis almost resolved with antibiotics however on the evening of 03/17/2020, patient  expressed her desire to forego any aggressive measures anymore and wanted to die peacefully.  This was verified by myself with the patient on the morning of 03/18/2020.  She was seen by palliative care who had discussion with the family and with mutual agreement, patient was made comfort care on the morning of 03/18/2020.  Assessment & Plan: Principal Problem:   Cellulitis Active Problems:   Multiple myeloma (HCC)   Pleural effusion, right   Acute combined systolic and diastolic HF (heart failure) (HCC)   Acute metabolic encephalopathy   Essential hypertension   Bacteremia due to Pseudomonas  Hypotension: Multifactorial with diminished cardiac output as well as sepsis due to bacteremia.  - PCCM consulted given persistent hypotension.  Lactic acid, however, remains wnl.  Still with low blood pressure but she is alert and oriented.  She has expressed her desire to forego any aggressive measures and wanting to die peacefully since last night.  She has refused all sorts of treatments.  She was able to express her desire this morning when I saw her.  She was alert and oriented.  Now she was seen by palliative care and they made her comfort care.  Acute biventricular heart failure, HTN: RV dysfunction appears to be new on echocardiogram.  - Cardiology consulted. CTA chest ruled out PE.  Now comfort care.  Gram-negative rods/Pseudomonas bacteremia:  - ID, Dr. West Bali, consulted for further recommendations.  - Switched from vancomycin and meropenem to cefepime which the patient has tolerated thus far.  Blood culture repeated 03/17/2020.  Now comfort care.  RLE cellulitis: In setting of chemotherapy, MM, opportunistic infection with pseudomonas is feasible.  Swelling noted, negative venous U/S for DVT, though pulsations suggested high RV pressures.  No further signs of cellulitis today but continues to have some +1 pitting edema right lower extremity.    AKI: Likely from hypoperfusion due to hypotension.  Creatinine improving.   Hypokalemia: Resolved  Multiple myeloma in relapse: Recently had port inserted and restarted chemotherapy under Dr. Burr Medico and [facility] oncology Dr. [name].  - Port site does not appear infected at this time.   HLD:  - Statin  DVT prophylaxis: Lovenox Code Status: DNR, Family Communication: None present at bedside when I saw her this morning.  Palliative care had discussion with the family and now she is comfort care. Disposition Plan:  Status is: Inpatient  Remains inpatient appropriate because:Hemodynamically unstable  Dispo: The patient is from: Home              Anticipated d/c is to: TBD              Anticipated d/c date is: 3 days              Patient currently is not medically stable to d/c.  Consultants:   PCCM  ID  Oncology  Cardiology  Procedures:   Echocardiogram  Antimicrobials:  Vancomycin, ancef, meropenem 9/1  Acyclovir 9/1 >>  Cefepime 9/2 >>   Subjective: Patient seen and examined early this morning.  She was alert and oriented.  She was able to express her desire to die peacefully and forego any further aggressive treatment.  Objective: Vitals:   03/18/20 0900 03/18/20 1000 03/18/20 1100 03/18/20 1200  BP: (!) 94/47 (!) 90/55 (!) 95/55 (!) 86/44  Pulse: 78 85 85 84  Resp: 18 (!) 21 16 (!) 21  Temp:      TempSrc:      SpO2: 94% 94% 94% 95%  Weight:      Height:        Intake/Output Summary (Last 24 hours) at 03/18/2020 1306 Last data filed at 03/18/2020 0300 Gross per 24 hour  Intake 536.2 ml  Output 400 ml  Net 136.2 ml   Filed Weights   03/15/20 1026 03/17/20 0349 03/18/20 0500  Weight: 51.9 kg 54.9 kg 54.3 kg   General exam: Appears calm and comfortable  Respiratory system: Clear to auscultation. Respiratory effort normal. Cardiovascular system: S1 & S2 heard, RRR. No JVD, murmurs, rubs, gallops or clicks. No pedal edema. Gastrointestinal system: Abdomen is nondistended, soft and nontender. No  organomegaly or masses felt. Normal bowel sounds heard. Central nervous system: Alert and oriented. No focal neurological deficits. Extremities: Symmetric 5 x 5 power. Skin: No rashes, lesions or ulcers.  Psychiatry: Judgement and insight appear normal. Mood & affect appropriate.     Data Reviewed: I have personally reviewed following labs and imaging studies  CBC: Recent Labs  Lab 03/15/20 1021 03/16/20 0551 03/17/20 0254 03/18/20 0523  WBC 7.4 2.3* 4.2 6.1  NEUTROABS 6.3  --   --  5.4  HGB 8.8* 8.1* 7.8* 7.1*  HCT 26.9* 24.1* 23.8* 22.0*  MCV 106.3* 105.2* 105.8* 105.8*  PLT 131* 102* 98* 98*   Basic Metabolic Panel: Recent Labs  Lab 03/15/20 1021 03/16/20 1100 03/17/20 0254 03/18/20 0523  NA 138 138 137 140  K 3.8 3.6 3.3* 4.0  CL 108 109 106 111  CO2 19* 19* 19* 20*  GLUCOSE 121* 97 180* 143*  BUN 23 31* 38* 47*  CREATININE 0.80 1.17* 1.05* 0.94  CALCIUM 9.1 8.4* 8.4* 8.8*  MG  --   --  2.2 2.4   GFR: Estimated Creatinine Clearance: 43 mL/min (by C-G formula based on SCr of 0.94 mg/dL).  Urine analysis:    Component Value Date/Time   COLORURINE YELLOW 08/18/2018 1313   APPEARANCEUR CLEAR 08/18/2018 1313   LABSPEC 1.013 08/18/2018 1313   LABSPEC 1.015 08/20/2012 1124   PHURINE 7.0 08/18/2018 1313   GLUCOSEU NEGATIVE 08/18/2018 1313   GLUCOSEU Negative 08/20/2012 1124   HGBUR NEGATIVE 08/18/2018 1313   HGBUR negative 10/22/2007 1336   BILIRUBINUR NEGATIVE 08/18/2018 1313   BILIRUBINUR Negative 08/20/2012 1124   KETONESUR 5 (A) 08/18/2018 1313   PROTEINUR 30 (A) 08/18/2018 1313   UROBILINOGEN 0.2 09/14/2014 2100   UROBILINOGEN 0.2 08/20/2012 1124   NITRITE NEGATIVE 08/18/2018 1313   LEUKOCYTESUR NEGATIVE 08/18/2018 1313   LEUKOCYTESUR Small 08/20/2012 1124   Recent Results (from the past 240 hour(s))  Aerobic Culture (superficial specimen)     Status: Abnormal   Collection Time: 03/15/20 10:21 AM   Specimen: Leg  Result Value Ref Range Status    Specimen Description   Final    LEG RIGHT Performed at [facility] [address], [place], [place] [address]    Special Requests   Final    Immunocompromised Performed at [facility], [place] [address], [place], [place] [address]    Gram Stain   Final    NO WBC SEEN MODERATE GRAM VARIABLE ROD FEW GRAM POSITIVE COCCI FEW GRAM NEGATIVE RODS Performed at [facility], [place] [address], [place], [place] [address]    Culture MULTIPLE ORGANISMS PRESENT, NONE PREDOMINANT (A)  Final   Report Status 03/17/2020 FINAL  Final  SARS Coronavirus 2 by RT PCR (hospital order, performed in [facility] hospital lab) Nasopharyngeal Nasopharyngeal Swab     Status: None   Collection Time: 03/15/20 10:26 AM   Specimen: Nasopharyngeal Swab  Result Value Ref Range Status   SARS Coronavirus 2 NEGATIVE NEGATIVE Final    Comment: (NOTE) SARS-CoV-2 target nucleic acids are NOT DETECTED.  The SARS-CoV-2 RNA is generally detectable in upper and lower respiratory specimens during the acute phase of infection. The lowest concentration of SARS-CoV-2 viral copies this assay can detect is 250 copies / mL. A negative result does not preclude SARS-CoV-2 infection and should not be used as the sole basis for treatment or other patient management decisions.  A negative result may occur with improper specimen collection / handling, submission of specimen other than nasopharyngeal swab, presence of viral mutation(s) within the areas targeted by this assay, and inadequate number of viral copies (<250 copies / mL). A negative result must be combined with clinical observations, patient history, and epidemiological information.  Fact Sheet for Patients:   StrictlyIdeas.no  Fact Sheet for Healthcare Providers: BankingDealers.co.za  This test is not yet approved or  cleared by the [place] FDA and has been authorized for  detection and/or diagnosis of SARS-CoV-2 by FDA under an Emergency Use Authorization (EUA).  This EUA will remain in effect (meaning this test can be used) for the duration of the COVID-19 declaration under Section 564(b)(1) of the Act, 21 U.S.C. section 360bbb-3(b)(1), unless the authorization is terminated or revoked sooner.  Performed at [facility], [place] [address] [place], [place] [address]   Culture, blood (routine x 2)     Status: Abnormal   Collection Time: 03/15/20 11:10 AM   Specimen: BLOOD  Result Value Ref Range Status   Specimen Description   Final  BLOOD RIGHT ANTECUBITAL Performed at [facility] [address] [place], [place] [address]    Special Requests   Final    BOTTLES DRAWN AEROBIC AND ANAEROBIC Blood Culture adequate volume Performed at [facility] [address], [place], [place] [address]    Culture  Setup Time   Final    IN BOTH AEROBIC AND ANAEROBIC BOTTLES GRAM NEGATIVE RODS CRITICAL RESULT CALLED TO, READ BACK BY AND VERIFIED WITH: Sheffield Slider [facility] 03/16/20 0600 JDW Performed at [facility], [address], [place], [place] [address]    Culture PSEUDOMONAS AERUGINOSA (A)  Final   Report Status 03/18/2020 FINAL  Final   Organism ID, Bacteria PSEUDOMONAS AERUGINOSA  Final      Susceptibility   Pseudomonas aeruginosa - MIC*    CEFTAZIDIME 4 SENSITIVE Sensitive     CIPROFLOXACIN <=0.25 SENSITIVE Sensitive     GENTAMICIN <=1 SENSITIVE Sensitive     IMIPENEM 2 SENSITIVE Sensitive     PIP/TAZO 8 SENSITIVE Sensitive     CEFEPIME 4 SENSITIVE Sensitive     * PSEUDOMONAS AERUGINOSA  Blood Culture ID Panel (Reflexed)     Status: Abnormal   Collection Time: 03/15/20 11:10 AM  Result Value Ref Range Status   Enterococcus faecalis NOT DETECTED NOT DETECTED Final   Enterococcus Faecium NOT DETECTED NOT DETECTED Final   Listeria monocytogenes NOT DETECTED NOT DETECTED Final    Staphylococcus species NOT DETECTED NOT DETECTED Final   Staphylococcus aureus (BCID) NOT DETECTED NOT DETECTED Final   Staphylococcus epidermidis NOT DETECTED NOT DETECTED Final   Staphylococcus lugdunensis NOT DETECTED NOT DETECTED Final   Streptococcus species NOT DETECTED NOT DETECTED Final   Streptococcus agalactiae NOT DETECTED NOT DETECTED Final   Streptococcus pneumoniae NOT DETECTED NOT DETECTED Final   Streptococcus pyogenes NOT DETECTED NOT DETECTED Final   A.calcoaceticus-baumannii NOT DETECTED NOT DETECTED Final   Bacteroides fragilis NOT DETECTED NOT DETECTED Final   Enterobacterales NOT DETECTED NOT DETECTED Final   Enterobacter cloacae complex NOT DETECTED NOT DETECTED Final   Escherichia coli NOT DETECTED NOT DETECTED Final   Klebsiella aerogenes NOT DETECTED NOT DETECTED Final   Klebsiella oxytoca NOT DETECTED NOT DETECTED Final   Klebsiella pneumoniae NOT DETECTED NOT DETECTED Final   Proteus species NOT DETECTED NOT DETECTED Final   Salmonella species NOT DETECTED NOT DETECTED Final   Serratia marcescens NOT DETECTED NOT DETECTED Final   Haemophilus influenzae NOT DETECTED NOT DETECTED Final   Neisseria meningitidis NOT DETECTED NOT DETECTED Final   Pseudomonas aeruginosa DETECTED (A) NOT DETECTED Final    Comment: CRITICAL RESULT CALLED TO, READ BACK BY AND VERIFIED WITH: Sheffield Slider PHARMD 03/16/20 0600 JDW    Stenotrophomonas maltophilia NOT DETECTED NOT DETECTED Final   Candida albicans NOT DETECTED NOT DETECTED Final   Candida auris NOT DETECTED NOT DETECTED Final   Candida glabrata NOT DETECTED NOT DETECTED Final   Candida krusei NOT DETECTED NOT DETECTED Final   Candida parapsilosis NOT DETECTED NOT DETECTED Final   Candida tropicalis NOT DETECTED NOT DETECTED Final   Cryptococcus neoformans/gattii NOT DETECTED NOT DETECTED Final   CTX-M ESBL NOT DETECTED NOT DETECTED Final   Carbapenem resistance IMP NOT DETECTED NOT DETECTED Final   Carbapenem resistance  KPC NOT DETECTED NOT DETECTED Final   Carbapenem resistance NDM NOT DETECTED NOT DETECTED Final   Carbapenem resistance VIM NOT DETECTED NOT DETECTED Final    Comment: Performed at [facility], [address]  [place], [place] [address]  MRSA PCR Screening     Status: None   Collection Time: 03/16/20  2:30 PM   Specimen: Nasopharyngeal  Result Value Ref Range Status   MRSA by PCR NEGATIVE NEGATIVE Final    Comment:        The GeneXpert MRSA Assay (FDA approved for NASAL specimens only), is one component of a comprehensive MRSA colonization surveillance program. It is not intended to diagnose MRSA infection nor to guide or monitor treatment for MRSA infections. Performed at [facility], [place] [address], [place], [place] [address]   Body fluid culture     Status: None (Preliminary result)   Collection Time: 03/17/20 12:32 PM   Specimen: Thoracentesis; Body Fluid  Result Value Ref Range Status   Specimen Description   Final    THORACENTESIS Performed at [facility] [address], [place], [place] [address]    Special Requests   Final    NONE Performed at [facility], [place] [address], [place], [place] [address]    Gram Stain PENDING  Incomplete   Culture   Final    NO GROWTH < 24 HOURS Performed at [facility], [place] [address] [place], [place] [address]    Report Status PENDING  Incomplete      Radiology Studies: CT ANGIO CHEST PE W OR WO CONTRAST  Result Date: 03/16/2020 CLINICAL DATA:  Elevated D-dimer and history of multiple myeloma. EXAM: CT ANGIOGRAPHY CHEST WITH CONTRAST TECHNIQUE: Multidetector CT imaging of the chest was performed using the standard protocol during bolus administration of intravenous contrast. Multiplanar CT image reconstructions and MIPs were obtained to evaluate the vascular anatomy. CONTRAST:  40m OMNIPAQUE IOHEXOL 350 MG/ML SOLN COMPARISON:  December 30, 2017 FINDINGS:  Cardiovascular: A right-sided venous Port-A-Cath is in place. There is mild calcification of the aortic arch. The subsegmental lower lobe branches of the pulmonary arteries are limited in evaluation. No evidence of pulmonary embolism. There is moderate severity cardiomegaly. No pericardial effusion. Mediastinum/Nodes: No enlarged mediastinal, hilar, or axillary lymph nodes. A subcentimeter calcified right hilar lymph node is seen. Thyroid gland, trachea, and esophagus demonstrate no significant findings. Lungs/Pleura: Mild areas of atelectasis and/or infiltrate are seen within the posterior aspects of the bilateral upper lobes and right lower lobe. Moderate severity left lower lobe atelectasis and/or infiltrate is noted. There is a moderate size right pleural effusion. A small left pleural effusion is also seen. No pneumothorax is identified. Upper Abdomen: No acute abnormality. Musculoskeletal: Numerous lytic lesions are seen scattered throughout the osseous skeleton. Chronic fracture deformities are seen involving the sternum, mid to distal left clavicle and multiple bilateral ribs. Numerous chronic compression fracture deformities are seen throughout the thoracic spine involving the T5 through T9 vertebral bodies and T12 vertebral body. Moderate severity multilevel degenerative changes are also seen throughout the thoracic spine. Review of the MIP images confirms the above findings. IMPRESSION: 1. No evidence of pulmonary embolism. 2. Mild bilateral upper lobes and right lower lobe atelectasis and/or infiltrate. 3. Moderate severity left lower lobe atelectasis and/or infiltrate. 4. Moderate size right pleural effusion. 5. Small left pleural effusion. 6. Numerous lytic lesions scattered throughout the osseous skeleton consistent with multiple myeloma. 7. Numerous chronic compression fracture deformities throughout the thoracic spine. 8. Chronic fracture deformities involving the sternum, mid to distal left  clavicle and multiple bilateral ribs. 9. Aortic atherosclerosis. Aortic Atherosclerosis (ICD10-I70.0). Electronically Signed   By: TVirgina NorfolkM.D.   On:  03/16/2020 16:28   DG Chest Port 1 View  Result Date: 03/17/2020 CLINICAL DATA:  Status post thoracentesis. EXAM: PORTABLE CHEST 1 VIEW COMPARISON:  March 15, 2020. FINDINGS: Stable cardiomegaly. No pneumothorax is noted. Stable right-sided Port-A-Cath. Mild bibasilar subsegmental atelectasis is noted with small left pleural effusion. Old left rib fractures are noted. IMPRESSION: Mild bibasilar subsegmental atelectasis with small left pleural effusion. No pneumothorax is noted. Electronically Signed   By: Marijo Conception M.D.   On: 03/17/2020 13:16    Scheduled Meds: . Chlorhexidine Gluconate Cloth  6 each Topical Daily  . hydrocortisone sod succinate (SOLU-CORTEF) inj  50 mg Intravenous Q6H  . mouth rinse  15 mL Mouth Rinse BID  . metoprolol succinate  25 mg Oral Daily  . midodrine  5 mg Oral TID WC  . sodium chloride flush  10-40 mL Intracatheter Q12H   Continuous Infusions: . sodium chloride Stopped (03/17/20 2241)  . acyclovir Stopped (03/18/20 0039)  . ceFEPime (MAXIPIME) IV Stopped (03/17/20 2311)     LOS: 3 days   Time spent: 29 minutes.  Darliss Cheney, MD [facility] [email or web address] 03/18/2020, 1:06 PM

## 2020-03-18 NOTE — Consult Note (Signed)
Consultation Note Date: 03/18/2020   Patient Name: Shannon Rush  DOB: 30-Jul-1940  MRN: [id number]  Age / Sex: 79 y.o., female  PCP: [name], MD Referring Physician: Darliss Cheney, MD  Reason for Consultation: Establishing goals of care  HPI/Patient Profile: 79 y.o. female   admitted on 03/15/2020  79 year old lady who lives at home with her husband.  She has a history of multiple myeloma for which she follows with Dr. Burr Medico and has also undergone bone marrow transplant in the past.  She has coronary artery disease, combined heart failure.  Patient has been admitted with sepsis, hypotension and biventricular heart failure.  She remains admitted for sepsis, right lower extremity cellulitis, Pseudomonas bacteremia.  She has also undergone thoracentesis earlier in this hospitalization.  Patient represented her wishes this morning that she elects for palliative care and did not want to continue current mode of care.  I arrived at the bedside and spoke with patient and her husband.  Additionally also brief conversation via [place] with patient's daughter.  I met with Ms. Mikeria and her husband at the bedside.  Introduced palliative care as follows:  Palliative medicine is specialized medical care for people living with serious illness. It focuses on providing relief from the symptoms and stress of a serious illness. The goal is to improve quality of life for both the patient and the family.  Goals of care: Broad aims of medical therapy in relation to the patient's values and preferences. Our aim is to provide medical care aimed at enabling patients to achieve the goals that matter most to them, given the circumstances of their particular medical situation and their constraints.   Brief life review performed.  Patient is a retired Tourist information centre manager, she taught psychology at [employer], she has interest in  gerontology as well.  Patient believes that her life has had meaning and believes that she would like to pursue with comfort care and hospice.  [facility]  Discussed with patient and family about differences between hospice and palliative care, differences between time-limited trial of current interventions along with some symptom management efforts as opposed to full scope of comfort measures and residential hospice.  Patient and family to discuss further.  See below.  Clinical Assessment and Goals of Care:    NEXT OF KIN Lives at home with husband.  Has 2 daughters.  SUMMARY OF RECOMMENDATIONS   DNR Symptom management: Will add as needed low-dose opioids and benzodiazepines.  Continue to monitor. Initiate some comfort measures: We will discontinue routine lab draws, will discontinue p.o. medications no longer contributing to comfort.  Monitor as needed symptom management needs. Ongoing discussions with patient and her family about hospice philosophy of care, specifically residential hospice.  Additional family arriving tonight.  Palliative to follow on 03-19-20. Thank you for the consult.  Code Status/Advance Care Planning:  DNR    Symptom Management:   As above  Palliative Prophylaxis:   Delirium Protocol  Additional Recommendations (Limitations, Scope, Preferences):  No  Lab Draws  Psycho-social/Spiritual:   Desire for further Chaplaincy support:yes  Additional Recommendations: Caregiving  Support/Resources  Prognosis:   Unable to determine  Discharge Planning: To Be Determined      Primary Diagnoses: Present on Admission: . Cellulitis . Multiple myeloma ([place]) . Bacteremia due to Pseudomonas   I have reviewed the medical record, interviewed the patient and family, and examined the patient. The following aspects are pertinent.  Past Medical History:  Diagnosis Date  . CAD (coronary artery disease)    a. inf STEMI (in [place]  >>> [facility]) >>> LHC (8/15- [place]):  pRCA 60%, mid to dist RCA 50% >>> med rx  . Cardiomyopathy ([place])   . Cataract   . Chronic combined systolic and diastolic CHF (congestive heart failure) ([place])    cancer medication related   . Cord compression ([place]) 07/13/12   MRI- diffuse myeloma involvement of T-L spine  . History of radiation therapy 07/13/12-07/27/12   spinal cord compression T3-T10,left scapula  . Infection 01/29/2020   left leg infection- [facility] at [facility]  . 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 Multiple myeloma ([place]) 07/01/2012  . MVP (mitral valve prolapse)    a. trivial by echo 06/2017.  . OSTEOPOROSIS 06/11/2010   Multiple compression fractures; and spontaneous fracture of sternum Qualifier: Diagnosis of  By: [name], [name]     . Thoracic kyphosis 07/13/12   per MRI scan  . Unspecified deficiency anemia    Social History   Socioeconomic History  . Marital status: Married    Spouse name: Fritz Pickerel  . Number of children: 2  . Years of education: Not on file  . Highest education level: Doctorate  Occupational History  . Occupation: Museum/gallery conservator: [employer]  Tobacco Use  . Smoking status: Never Smoker  . Smokeless tobacco: Never Used  Vaping Use  . Vaping Use: Never used  Substance and Sexual Activity  . Alcohol use: Yes    Alcohol/week: 7.0 standard drinks    Types: 7 Standard drinks or equivalent per week    Comment: 1 glass of wine or beer 3-5 days per week  . Drug use: No  . Sexual activity: Yes    Birth control/protection: Post-menopausal  Other Topics Concern  . Not on file  Social History Narrative   Who lives with you: spouse, 2 story home, does not have problems with the stairs, has walk in shower, handrails.   Any pets: Dog, Brayden, black lab/boxer mix   Diet: Patient has a varied diet of protein, vegetables, starches.  Limits red meats.   Exercise: Patient exercises at least 3 times a week for over an  hour.   Seatbelts: Patient reports wearing her seatbelt when in vehicle.    Nancy Fetter Exposure/Protection: Patient reports wearing sunscreen daily.   Hobbies: gardening, swimming, reading, biking, [facility]      Important Relationships & Pets: Husband, two daughters, son in Sports coach and grandchildren / Black lab/boxer mix named Braden    Current Stressors: Other son-in-law's recent death, "Corporate treasurer"    Work / Education:  PhD in psychology/ Retired Professor at [employer] / Personal Beliefs:   "No organized religionRetail banker / Fun: Getting together with friends, politics and grandchildren    Social Determinants of Radio broadcast assistant Strain:   . Difficulty of Paying Living Expenses: Not on file  Food Insecurity:   . Worried  About Running Out of Food in the Last Year: Not on file  . Ran Out of Food in the Last Year: Not on file  Transportation Needs:   . Lack of Transportation (Medical): Not on file  . Lack of Transportation (Non-Medical): Not on file  Physical Activity:   . Days of Exercise per Week: Not on file  . Minutes of Exercise per Session: Not on file  Stress:   . Feeling of Stress : Not on file  Social Connections:   . Frequency of Communication with Friends and Family: Not on file  . Frequency of Social Gatherings with Friends and Family: Not on file  . Attends Religious Services: Not on file  . Active Member of Clubs or Organizations: Not on file  . Attends Archivist Meetings: Not on file  . Marital Status: Not on file   Family History  Problem Relation Age of Onset  . Glaucoma Mother   . Heart disease Mother   . Peripheral vascular disease Mother   . Raynaud syndrome Mother   . Heart disease Father   . Heart failure Father   . Glaucoma Father   . Cancer Brother        stage IV prostate CA  . Prostate cancer Brother   . Raynaud syndrome Daughter   . Raynaud syndrome Daughter   . Breast cancer Neg Hx   . Colon cancer  Neg Hx    Scheduled Meds: . Chlorhexidine Gluconate Cloth  6 each Topical Daily  . hydrocortisone sod succinate (SOLU-CORTEF) inj  50 mg Intravenous Q6H  . mouth rinse  15 mL Mouth Rinse BID  . metoprolol succinate  25 mg Oral Daily  . midodrine  5 mg Oral TID WC  . sodium chloride flush  10-40 mL Intracatheter Q12H   Continuous Infusions: . sodium chloride Stopped (03/17/20 2241)  . acyclovir Stopped (03/18/20 0039)  . ceFEPime (MAXIPIME) IV Stopped (03/17/20 2311)   PRN Meds:.sodium chloride, acetaminophen, lip balm, LORazepam, metoprolol tartrate, morphine injection, ondansetron, prochlorperazine, sodium chloride flush Medications Prior to Admission:  Prior to Admission medications   Medication Sig Start Date End Date Taking? Authorizing Provider  acetaminophen (TYLENOL) 500 MG tablet Take 500 mg by mouth every 6 (six) hours as needed for moderate pain or fever.   Yes [provider]  acyclovir (ZOVIRAX) 400 MG tablet Take 1 tablet (400 mg total) by mouth 2 (two) times daily. 02/22/19  Yes [name], MD  aspirin EC 81 MG tablet Take 1 tablet (81 mg total) by mouth daily. 04/01/18  Yes Dunn, Dayna N, PA-C  atorvastatin (LIPITOR) 20 MG tablet TAKE 1 TABLET BY MOUTH  DAILY Patient taking differently: Take 20 mg by mouth daily.  01/05/20  Yes [name], MD  calcium-vitamin D (OSCAL WITH D) 500-200 MG-UNIT TABS tablet Take by mouth.   Yes [provider]  cyclophosphamide (CYTOXAN) 50 MG capsule Take 6 capsules (369m) by mouth once weekly on day of chemo for 2 weeks, then off for one week 02/17/20  Yes FTruitt Merle MD  doxycycline (VIBRA-TABS) 100 MG tablet Take 100 mg by mouth as needed (treatment for pneumonia).    Yes [provider]  furosemide (LASIX) 20 MG tablet Take 1 tablet (20 mg total) by mouth daily. For 5 days then as needed 02/11/20  Yes FTruitt Merle MD  metoprolol succinate (TOPROL-XL) 25 MG 24 hr tablet TAKE 1 TABLET BY MOUTH  DAILY 11/08/19  Yes  NDorothy Spark  MD  morphine (MSIR) 15 MG tablet Take 0.5 tablets (7.5 mg total) by mouth every 8 (eight) hours as needed for severe pain. 02/04/20  Yes [name], MD  ondansetron (ZOFRAN) 8 MG tablet Take 1 tablet (8 mg total) by mouth every 8 (eight) hours as needed for nausea or vomiting. 06/02/19  Yes [name], MD  prochlorperazine (COMPAZINE) 10 MG tablet Take 1 tablet (10 mg total) by mouth every 6 (six) hours as needed (Nausea or vomiting). 06/14/19  Yes [name], MD  triamcinolone cream (KENALOG) 0.1 % Apply 0.1 application topically 2 (two) times daily as needed (rash).  02/02/20 02/01/21 Yes [provider]   Allergies  Allergen Reactions  . Zithromax [Azithromycin] Rash    Full body rash  . Zosyn [Piperacillin Sod-Tazobactam So] Swelling and Rash    Itching, rash and swelling of extremities Tolerates Rocephin  . Quinolones Rash    Cipro, Levaquin implicated Cipro also caused vomiting and abd pain   Review of Systems +generalized pain  Physical Exam Appears frail and weak Resting in bed Has some edema right lower extremity with redness Awake alert oriented Does not have any focal deficits Has coarse rales Abdomen is not distended Vital Signs: BP (!) 89/48   Pulse 86   Temp 97.8 F (36.6 C) (Oral)   Resp (!) 25   Ht _0  (1.676 m)   Wt 54.3 kg   SpO2 94%   BMI 19.32 kg/m  Pain Scale: 0-10   Pain Score: Asleep   SpO2: SpO2: 94 % O2 Device:SpO2: 94 % O2 Flow Rate: .O2 Flow Rate (L/min): 0.5 L/min  IO: Intake/output summary:   Intake/Output Summary (Last 24 hours) at 03/18/2020 1203 Last data filed at 03/18/2020 0300 Gross per 24 hour  Intake 536.2 ml  Output 400 ml  Net 136.2 ml    LBM: Last BM Date:  (UTA) Baseline Weight: Weight: 51.9 kg Most recent weight: Weight: 54.3 kg     Palliative Assessment/Data:   PPS 30%  Time In:  11 Time Out:  12 Time Total:   60  Greater than 50%  of this time was spent counseling and coordinating care  related to the above assessment and plan.  Signed by: Loistine Chance, MD   Please contact Palliative Medicine Team phone at [phone] for questions and concerns.  For individual provider: See Shea Evans

## 2020-03-19 DIAGNOSIS — R52 Pain, unspecified: Secondary | ICD-10-CM

## 2020-03-19 LAB — PH, BODY FLUID: pH, Body Fluid: 7.7

## 2020-03-19 NOTE — Progress Notes (Signed)
Daily Progress Note   Patient Name: Shannon Rush       Date: 03/19/2020 DOB: August 25, 1940  Age: 79 y.o. MRN#: [id number] Attending Physician: Darliss Cheney, MD Primary Care Physician: [name], MD Admit Date: 03/15/2020  Reason for Consultation/Follow-up: Terminal Care  Subjective: Appears more fatigued, less alert. Awakens and attempts to engage some. Daughter who is an Materials engineer at [employer] is now at the bedside, discussed with patient and daughter about comfort care and residential hospice, see below.   Length of Stay: 4  Current Medications: Scheduled Meds:  . Chlorhexidine Gluconate Cloth  6 each Topical Daily  . hydrocortisone sod succinate (SOLU-CORTEF) inj  50 mg Intravenous Q6H  . mouth rinse  15 mL Mouth Rinse BID  . metoprolol succinate  25 mg Oral Daily  . midodrine  5 mg Oral TID WC  . sodium chloride flush  10-40 mL Intracatheter Q12H    Continuous Infusions: . sodium chloride Stopped (03/17/20 2241)  . acyclovir Stopped (03/18/20 0039)  . ceFEPime (MAXIPIME) IV Stopped (03/17/20 2311)    PRN Meds: sodium chloride, acetaminophen, lip balm, LORazepam, metoprolol tartrate, morphine injection, ondansetron, prochlorperazine, sodium chloride flush  Physical Exam         Appears frail and weak Appears pale Shallow regular work of breathing Abdomen is not distended No edema Awakens and attempts to interact some, but overall is less alert than yesterday.   Vital Signs: BP 102/65 (BP Location: Right Arm)   Pulse 86   Temp 98.5 F (36.9 C)   Resp 18   Ht _0  (1.676 m)   Wt 53.3 kg   SpO2 100%   BMI 18.98 kg/m  SpO2: SpO2: 100 % O2 Device: O2 Device: Room Air O2 Flow Rate: O2 Flow Rate (L/min): 0.5 L/min  Intake/output summary:   Intake/Output Summary (Last  24 hours) at 03/19/2020 1030 Last data filed at 03/19/2020 0815 Gross per 24 hour  Intake --  Output 250 ml  Net -250 ml   LBM: Last BM Date:  (UTA/Patient unable to answer) Baseline Weight: Weight: 51.9 kg Most recent weight: Weight: 53.3 kg       Palliative Assessment/Data:      Patient Active Problem List   Diagnosis Date Noted  . Bacteremia due to Pseudomonas 03/16/2020  . Cellulitis 03/15/2020  .  Acute combined systolic and diastolic HF (heart failure) ([place]) 03/15/2020  . Acute metabolic encephalopathy 93/81/0175  . Essential hypertension 03/15/2020  . Cellulitis, leg 02/04/2020  . TIA (transient ischemic attack) 12/22/2018  . Vertigo 11/24/2018  . Hammer toe of right foot 11/24/2018  . Delayed wound healing 11/24/2018  . Acute bronchitis 08/29/2018  . Closed fracture of multiple pubic rami ([place]) 01/27/2018  . Closed fracture of multiple pubic rami, initial encounter ([place]) 01/27/2018  . Port-A-Cath in place 11/21/2017  . Goals of care, counseling/discussion 02/28/2017  . Leg edema, right 12/04/2016  . Non-ischemic cardiomyopathy ([place]) 12/04/2016  . Antineoplastic chemotherapy induced anemia 03/28/2016  . Diastolic dysfunction-grade 2 03/27/2016  . Pleural effusion, right   . Patient on antineoplastic chemotherapy regimen   . Adrenocortical insufficiency ([place])   . Drug-induced neutropenia ([place]) 03/11/2016  . HCAP (healthcare-associated pneumonia) 03/10/2016  . Corn of foot 11/04/2014  . Protein-calorie malnutrition ([place]) 09/23/2014  . Pancytopenia ([place]) 08/10/2014  . CAD -50-60% RCA Aug 2015 03/11/2014  . History of STEMI-Aug 2015- secondary to thrombotic state from chemo 03/10/2014  . Unspecified vitamin D deficiency 05/05/2013  . History of peripheral stem cell transplant ([place]) 03/05/2013  . History of organ or tissue transplant 03/05/2013  . Swelling, mass, or lump in chest 12/24/2012  . Multiple myeloma ([place]) 07/01/2012  . Sciatica of left side 06/03/2011  .  Osteoporosis 06/11/2010  . Anemia, macrocytic 06/02/2009  . DJD, UNSPECIFIED 09/11/2006    Palliative Care Assessment & Plan   Patient Profile:  79 year old lady who lives at home with her husband.  She has a history of multiple myeloma for which she follows with Dr. Burr Medico and has also undergone bone marrow transplant in the past.  She has coronary artery disease, combined heart failure.  Patient has been admitted with sepsis, hypotension and biventricular heart failure.  She remains admitted for sepsis, right lower extremity cellulitis, Pseudomonas bacteremia.  She has also undergone thoracentesis earlier in this hospitalization.  Assessment:  generalized weakness Generalized pain Comfort measures  Recommendations/Plan:   discussed with patient, and mostly with daughter about residential hospice, they are familiar with [facility]. Will reach out to hospice liaison and help facilitate.   Prognosis less than 2 weeks in my opinion.   Goals of Care and Additional Recommendations:  Limitations on Scope of Treatment: Full Comfort Care  Code Status:    Code Status Orders  (From admission, onward)         Start     Ordered   03/15/20 1532  Do not attempt resuscitation (DNR)  Continuous       Question Answer Comment  In the event of cardiac or respiratory ARREST Do not call a "code blue"   In the event of cardiac or respiratory ARREST Do not perform Intubation, CPR, defibrillation or ACLS   In the event of cardiac or respiratory ARREST Use medication by any route, position, wound care, and other measures to relive pain and suffering. May use oxygen, suction and manual treatment of airway obstruction as needed for comfort.      03/15/20 1532        Code Status History    Date Active Date Inactive Code Status Order ID Comments User Context   01/27/2018 0353 01/27/2018 2247 DNR [id number]  Rise Patience, MD Inpatient   12/31/2017 0214 01/02/2018 1514 DNR [id number]  Bethena Roys, MD Inpatient   12/12/2017 1845 12/15/2017 1738 DNR [id number]  Mariel Aloe, MD Inpatient  03/10/2016 1341 03/29/2016 1928 Full Code [id number]  Caren Griffins, MD ED   09/14/2014 2344 09/19/2014 1820 Full Code [id number]  Toy Baker, MD Inpatient   08/09/2014 0106 08/13/2014 1925 Full Code [id number]  Ivor Costa, MD Inpatient   Advance Care Planning Activity    Advance Directive Documentation     Most Recent Value  Type of Advance Directive Healthcare Power of Attorney  Pre-existing out of facility DNR order (yellow form or pink MOST form) --  "MOST" Form in Place? --       Prognosis:   < 2 weeks  Discharge Planning:  Hospice facility  Care plan was discussed with  Patient, daughter, [facility] MD.   Thank you for allowing the Palliative Medicine Team to assist in the care of this patient.   Time In: 10 Time Out: 10.30 Total Time 30 Prolonged Time Billed No       Greater than 50%  of this time was spent counseling and coordinating care related to the above assessment and plan.  Loistine Chance, MD  Please contact Palliative Medicine Team phone at [phone] for questions and concerns.

## 2020-03-19 NOTE — Progress Notes (Signed)
Pt discharged to [facility] via [facility]. Report called to [facility]. No immediate questions or concerns at this time.

## 2020-03-19 NOTE — Progress Notes (Addendum)
Manufacturing engineer [facility])  Referral received for residential hospice at [facility].  Spoke with dtr [name] to confirm interest.  Spouse had stepped away for a break.  Once consents are completed, transport may be arranged to [facility].  [facility] will update TOC once consents are done and then transport can occur.  RN staff, please call [phone] at any time to give report.  Room is assigned at that time.  Please fax dc summary to [phone].  Thank you, Venia Carbon RN, BSN, [facility] Liaison   **consents are completed and transportation to [facility] can be arranged.  TOC manager updated.

## 2020-03-19 NOTE — Progress Notes (Signed)
Change in patients status to focus on comfort measures noted.  I will sign off, call with any questions.  Thayer Headings, MD

## 2020-03-19 NOTE — Progress Notes (Signed)
TOC CSW contacted [facility] to inform them of her arrival.  DC packet has been given to nurse and Corey Harold has been called.  Pt is ready for dc.  Shannon Rush, MSW, [place] [facility] Transitions of CareClinical Social Worker [email or web address] [phone] Rush, MSW, LCSW-A

## 2020-03-19 NOTE — TOC Initial Note (Signed)
Transition of Care [facility]) - Initial/Assessment Note    Patient Details  Name: Shannon Rush MRN: [id number] Date of Birth: [birth year]  Transition of Care [facility]) CM/SW Contact:    Janila Arrazola C Tarpley-Carter, LCSWA Phone Number: 03/19/2020, 10:51 AM  Clinical Narrative:                 TOC CSW reached out to Sheyna Pettibone daughter [phone].  Per [name], pts family has chosen [facility] for palliative care.    CSW called [facility] [phone].  CSW left HIPPA compliant message with my contact information.    Expected Discharge Plan: Home/Self Care  [facility]  Barriers to Discharge: Continued Medical Work up     Patient Goals and CMS Choice  CMS Medicare.gov Compare Post Acute Care list provided to:: Patient and family wants to go to [facility].    Expected Discharge Plan and Services Expected Discharge Plan: Home/Self Care  Palliative Care   Discharge Planning Services: CM Consult   Living arrangements for the past 2 months: [place] with husband/Lawrence Keady. Expected Discharge Date:  (unknown)                                    Prior Living Arrangements/Services Living arrangements for the past 2 months: Single Family Home Lives with:: Spouse Patient language and need for interpreter reviewed:: Yes Do you feel safe going back to the place where you live?: Yes      Need for Family Participation in Patient Care: Yes (Comment) Care giver support system in place?: Yes (comment)   Criminal Activity/Legal Involvement Pertinent to Current Situation/Hospitalization: No - Comment as needed  Activities of Daily Living Home Assistive Devices/Equipment: Walker (specify type), Eyeglasses, Grab bars around toilet, Grab bars in shower, Other (Comment) (4 wheeled walker, walk-in shower, handicap toilet) ADL Screening (condition at time of admission) Patient's cognitive ability adequate to safely complete daily activities?: No  (patient is lethargic and not answering questions-husband here and answering questions) Is the patient deaf or have difficulty hearing?: No Does the patient have difficulty seeing, even when wearing glasses/contacts?: No Does the patient have difficulty concentrating, remembering, or making decisions?: Yes Patient able to express need for assistance with ADLs?: No Does the patient have difficulty dressing or bathing?: Yes Independently performs ADLs?: No Communication: Independent Dressing (OT): Needs assistance Is this a change from baseline?: Change from baseline, expected to last >3 days Grooming: Needs assistance Is this a change from baseline?: Change from baseline, expected to last >3 days Feeding: Needs assistance Is this a change from baseline?: Change from baseline, expected to last >3 days Bathing: Needs assistance Is this a change from baseline?: Change from baseline, expected to last >3 days Toileting: Dependent Is this a change from baseline?: Change from baseline, expected to last >3days In/Out Bed: Dependent Is this a change from baseline?: Change from baseline, expected to last >3 days Walks in Home: Dependent Is this a change from baseline?: Change from baseline, expected to last >3 days Does the patient have difficulty walking or climbing stairs?: Yes (secondary to weakness) Weakness of Legs: Both Weakness of Arms/Hands: Both  Permission Sought/Granted                  Emotional Assessment Appearance:: Appears stated age Attitude/Demeanor/Rapport: Engaged Affect (typically observed): Calm Orientation: : Oriented to Self, Oriented to Place, Oriented to  Time, Oriented to  Situation Alcohol / Substance Use: Not Applicable Psych Involvement: No (comment)  Admission diagnosis:  Cellulitis [L03.90] Thrombocytopenia (HCC) [D69.6] Cellulitis of right lower extremity [L03.115] Bacteremia due to Pseudomonas [R78.81, B96.5] Multiple myeloma (HCC) [C90.00] Anemia,  unspecified type [D64.9] Congestive heart failure, unspecified HF chronicity, unspecified heart failure type [facility]) [I50.9] Patient Active Problem List   Diagnosis Date Noted  . Bacteremia due to Pseudomonas 03/16/2020  . Cellulitis 03/15/2020  . Acute combined systolic and diastolic HF (heart failure) ([place]) 03/15/2020  . Acute metabolic encephalopathy 33/00/7622  . Essential hypertension 03/15/2020  . Cellulitis, leg 02/04/2020  . TIA (transient ischemic attack) 12/22/2018  . Vertigo 11/24/2018  . Hammer toe of right foot 11/24/2018  . Delayed wound healing 11/24/2018  . Acute bronchitis 08/29/2018  . Closed fracture of multiple pubic rami ([place]) 01/27/2018  . Closed fracture of multiple pubic rami, initial encounter ([place]) 01/27/2018  . Port-A-Cath in place 11/21/2017  . Goals of care, counseling/discussion 02/28/2017  . Leg edema, right 12/04/2016  . Non-ischemic cardiomyopathy ([place]) 12/04/2016  . Antineoplastic chemotherapy induced anemia 03/28/2016  . Diastolic dysfunction-grade 2 03/27/2016  . Pleural effusion, right   . Patient on antineoplastic chemotherapy regimen   . Adrenocortical insufficiency ([place])   . Drug-induced neutropenia ([place]) 03/11/2016  . HCAP (healthcare-associated pneumonia) 03/10/2016  . Corn of foot 11/04/2014  . Protein-calorie malnutrition ([place]) 09/23/2014  . Pancytopenia ([place]) 08/10/2014  . CAD -50-60% RCA Aug 2015 03/11/2014  . History of STEMI-Aug 2015- secondary to thrombotic state from chemo 03/10/2014  . Unspecified vitamin D deficiency 05/05/2013  . History of peripheral stem cell transplant ([place]) 03/05/2013  . History of organ or tissue transplant 03/05/2013  . Swelling, mass, or lump in chest 12/24/2012  . Multiple myeloma ([place]) 07/01/2012  . Sciatica of left side 06/03/2011  . Osteoporosis 06/11/2010  . Anemia, macrocytic 06/02/2009  . DJD, UNSPECIFIED 09/11/2006   PCP:  [name], MD Pharmacy:   [id number] [place],  [address] [place]Phone: [phone]: 3[place] [place]Phone: [phone]: [phone] [place] [address] [address] [place]Phone: [phone]: 8[place]Phone: [phone]: 4[place] [address]Phone: [phone]: [phone]    Social Determinants of Health (SDOH) Interventions    Readmission Risk Interventions No flowsheet data found.  Deep Bonawitz Tarpley-Carter, MSW, LAshkumED Transitions of CareClinical Social Worker Janaiah [email or web address] [phone]

## 2020-03-19 NOTE — Discharge Summary (Addendum)
Physician Discharge Summary  Shannon Rush FVC:[id number] DOB: 1940-08-03 DOA: 03/15/2020  PCP: [name], MD  Admit date: 03/15/2020 Discharge date: 03/19/2020  Admitted From: Home Disposition: [facility]/residential hospice  Recommendations for Outpatient Follow-up:  1. Follow up with PCP in 1-2 weeks 2. Please obtain BMP/CBC in one week 3. Please follow up with your PCP on the following pending results: Unresulted Labs (From admission, onward)          Start     Ordered   03/17/20 1232  Cholesterol, body fluid  Once,   R       Comments: Right side Pleural fluid    03/17/20 1233   03/17/20 1232  PH, Body Fluid  Once,   R        03/17/20 1233           Home Health: None Equipment/Devices: None  Discharge Condition: Guarded CODE STATUS: DNR Diet recommendation: Regular  Subjective: Seen and examined this morning.  Daughter at the bedside.  Patient to lethargic to hold any conversation.  Brief/Interim Summary: This is a 79 year old female with past medical history of multiple myeloma on Cytoxan,Kyprolis, Dexa(follows with Dr. Burr Medico), diastolic heart failure (EF 55 to 60% in April), CAD, inferior STEMI in 2015, pancytopenia, osteoporosis who was admitted to [facility] on 7/15 for left lower extremity cellulitis and developed right leg edema on 7/30 with a negative Doppler at the time and was put on Lasix for swelling which initially improved who has had increasing pain and swelling of the RLE over the past few days as well as fatigue. No relief despite home morphine. Had noted some weeping from her extremity.  no fever or chills or any other complaint.   Upon arrival to ED, she was hemodynamically stable on room air. Notable labs: Bicarb 19, glucose 121, BNP 2409, lactic acid 1.7, WBC 7.4, Hb 8.8, platelets 131, COVID-19 negative. Bilateral lower extremity ultrasound: Negative for DVT but suggestive of possible elevated right heart pressure. Received 1 L NS bolus for  infection and possible sepsis however CXR was concerning for probable heart failure and further fluids were discontinued. Was given vancomycin and Ancef x1"  She was admitted under hospitalist service and subsequently her blood cultures identified Pseudomonas, she developed hypotension which worsened, echocardiogram revealed RV and LV systolic dysfunction.  Patient did not have sepsis at the time of admission however during this hospitalization, patient developed signs and symptoms of suspicious including hypothermia with temperature of 96, tachycardia and leukopenia.  This was secondary to cellulitis and gram-negative bacteremia. Due to hypotension, PCCM was consulted and she was moved to stepdown unit.  She was started on midodrine.  Found to have right-sided pleural effusion for which he underwent thoracentesis with retrieval of 550 cc mL.  Continue to have persistent hypotension with no improvement even after thoracentesis.  She did not require any vasopressors.  She improved with her mentation and her cellulitis almost resolved with antibiotics however on the evening of 03/17/2020, patient expressed her desire to forego any aggressive measures anymore and wanted to die peacefully.  This was verified by myself with the patient on the morning of 03/18/2020.  She was seen by palliative care who had discussion with the family and with mutual agreement, patient was made comfort care on the morning of 03/18/2020.  All medications and antibiotics were stopped.  Bed was arranged for her to be complete so she is going to be discharged today in guarded condition.  Discharge Diagnoses:  Principal Problem:   Cellulitis Active Problems:   Multiple myeloma (HCC)   Pleural effusion, right   Acute combined systolic and diastolic HF (heart failure) (HCC)   Acute metabolic encephalopathy   Essential hypertension   Bacteremia due to Pseudomonas Severe sepsis secondary to Pseudomonas bacteremia and left lower extremity  cellulitis.   Discharge Instructions  Discharge Instructions    Discharge patient   Complete by: As directed    Discharge disposition: [facility]-Hospice/Medical Facility   Discharge patient date: 03/19/2020     Allergies as of 03/19/2020      Reactions   Zithromax [azithromycin] Rash   Full body rash   Zosyn [piperacillin Sod-tazobactam So] Swelling, Rash   Itching, rash and swelling of extremities Tolerates Rocephin   Quinolones Rash   Cipro, Levaquin implicated Cipro also caused vomiting and abd pain      Medication List    TAKE these medications   acetaminophen 500 MG tablet Commonly known as: TYLENOL Take 500 mg by mouth every 6 (six) hours as needed for moderate pain or fever.   acyclovir 400 MG tablet Commonly known as: ZOVIRAX Take 1 tablet (400 mg total) by mouth 2 (two) times daily.   aspirin EC 81 MG tablet Take 1 tablet (81 mg total) by mouth daily.   atorvastatin 20 MG tablet Commonly known as: LIPITOR TAKE 1 TABLET BY MOUTH  DAILY   calcium-vitamin D 500-200 MG-UNIT Tabs tablet Commonly known as: OSCAL WITH D Take by mouth.   cyclophosphamide 50 MG capsule Commonly known as: CYTOXAN Take 6 capsules (32m) by mouth once weekly on day of chemo for 2 weeks, then off for one week   doxycycline 100 MG tablet Commonly known as: VIBRA-TABS Take 100 mg by mouth as needed (treatment for pneumonia).   furosemide 20 MG tablet Commonly known as: LASIX Take 1 tablet (20 mg total) by mouth daily. For 5 days then as needed   metoprolol succinate 25 MG 24 hr tablet Commonly known as: TOPROL-XL TAKE 1 TABLET BY MOUTH  DAILY   morphine 15 MG tablet Commonly known as: MSIR Take 0.5 tablets (7.5 mg total) by mouth every 8 (eight) hours as needed for severe pain.   ondansetron 8 MG tablet Commonly known as: ZOFRAN Take 1 tablet (8 mg total) by mouth every 8 (eight) hours as needed for nausea or vomiting.   prochlorperazine 10 MG tablet Commonly known as:  COMPAZINE Take 1 tablet (10 mg total) by mouth every 6 (six) hours as needed (Nausea or vomiting).   triamcinolone cream 0.1 % Commonly known as: KENALOG Apply 0.1 application topically 2 (two) times daily as needed (rash).       Allergies  Allergen Reactions  . Zithromax [Azithromycin] Rash    Full body rash  . Zosyn [Piperacillin Sod-Tazobactam So] Swelling and Rash    Itching, rash and swelling of extremities Tolerates Rocephin  . Quinolones Rash    Cipro, Levaquin implicated Cipro also caused vomiting and abd pain    Consultations: ID, cardiology, PCCM, palliative care   Procedures/Studies: DG Chest 2 View  Result Date: 03/15/2020 CLINICAL DATA:  Weakness and right leg pain. EXAM: CHEST - 2 VIEW COMPARISON:  08/18/2018 FINDINGS: Patchy bilateral airspace disease. Cardiomegaly and vascular pedicle widening. There is a small right pleural effusion. No pneumothorax. Porta catheter with tip at the right atrium. Remote spinal, rib, and left clavicle fracture deformities. There is history of multiple myeloma with diffuse demineralization. IMPRESSION: Bilateral airspace disease that could be infection  or failure. Small right pleural effusion interval cardiomegaly favors failure. Electronically Signed   By: Monte Fantasia M.D.   On: 03/15/2020 11:05   DG Tibia/Fibula Right  Result Date: 03/15/2020 CLINICAL DATA:  Right leg pain with multiple wounds. Multiple myeloma. EXAM: RIGHT TIBIA AND FIBULA - 2 VIEW COMPARISON:  None. FINDINGS: Nonspecific subcutaneous reticulation. No evidence of fracture. There is generalized osteopenia without visible myeloma in the covered skeleton. IMPRESSION: Generalized soft tissue swelling without acute or malignant osseous finding. Electronically Signed   By: Monte Fantasia M.D.   On: 03/15/2020 11:06   CT ANGIO CHEST PE W OR WO CONTRAST  Result Date: 03/16/2020 CLINICAL DATA:  Elevated D-dimer and history of multiple myeloma. EXAM: CT ANGIOGRAPHY CHEST  WITH CONTRAST TECHNIQUE: Multidetector CT imaging of the chest was performed using the standard protocol during bolus administration of intravenous contrast. Multiplanar CT image reconstructions and MIPs were obtained to evaluate the vascular anatomy. CONTRAST:  72m OMNIPAQUE IOHEXOL 350 MG/ML SOLN COMPARISON:  December 30, 2017 FINDINGS: Cardiovascular: A right-sided venous Port-A-Cath is in place. There is mild calcification of the aortic arch. The subsegmental lower lobe branches of the pulmonary arteries are limited in evaluation. No evidence of pulmonary embolism. There is moderate severity cardiomegaly. No pericardial effusion. Mediastinum/Nodes: No enlarged mediastinal, hilar, or axillary lymph nodes. A subcentimeter calcified right hilar lymph node is seen. Thyroid gland, trachea, and esophagus demonstrate no significant findings. Lungs/Pleura: Mild areas of atelectasis and/or infiltrate are seen within the posterior aspects of the bilateral upper lobes and right lower lobe. Moderate severity left lower lobe atelectasis and/or infiltrate is noted. There is a moderate size right pleural effusion. A small left pleural effusion is also seen. No pneumothorax is identified. Upper Abdomen: No acute abnormality. Musculoskeletal: Numerous lytic lesions are seen scattered throughout the osseous skeleton. Chronic fracture deformities are seen involving the sternum, mid to distal left clavicle and multiple bilateral ribs. Numerous chronic compression fracture deformities are seen throughout the thoracic spine involving the T5 through T9 vertebral bodies and T12 vertebral body. Moderate severity multilevel degenerative changes are also seen throughout the thoracic spine. Review of the MIP images confirms the above findings. IMPRESSION: 1. No evidence of pulmonary embolism. 2. Mild bilateral upper lobes and right lower lobe atelectasis and/or infiltrate. 3. Moderate severity left lower lobe atelectasis and/or infiltrate. 4.  Moderate size right pleural effusion. 5. Small left pleural effusion. 6. Numerous lytic lesions scattered throughout the osseous skeleton consistent with multiple myeloma. 7. Numerous chronic compression fracture deformities throughout the thoracic spine. 8. Chronic fracture deformities involving the sternum, mid to distal left clavicle and multiple bilateral ribs. 9. Aortic atherosclerosis. Aortic Atherosclerosis (ICD10-I70.0). Electronically Signed   By: TVirgina NorfolkM.D.   On: 03/16/2020 16:28   DG Chest Port 1 View  Result Date: 03/17/2020 CLINICAL DATA:  Status post thoracentesis. EXAM: PORTABLE CHEST 1 VIEW COMPARISON:  March 15, 2020. FINDINGS: Stable cardiomegaly. No pneumothorax is noted. Stable right-sided Port-A-Cath. Mild bibasilar subsegmental atelectasis is noted with small left pleural effusion. Old left rib fractures are noted. IMPRESSION: Mild bibasilar subsegmental atelectasis with small left pleural effusion. No pneumothorax is noted. Electronically Signed   By: JMarijo ConceptionM.D.   On: 03/17/2020 13:16   IR IMAGING GUIDED PORT INSERTION  Result Date: 03/07/2020 INDICATION: 79year old female with multiple myeloma in relapse. She requires durable venous access. EXAM: IMPLANTED PORT A CATH PLACEMENT WITH ULTRASOUND AND FLUOROSCOPIC GUIDANCE MEDICATIONS: 1 g vancomycin; The antibiotic was administered within an  appropriate time interval prior to skin puncture. ANESTHESIA/SEDATION: Versed 1 mg IV; Fentanyl 50 mcg IV; Moderate Sedation Time:  12 minutes The patient was continuously monitored during the procedure by the interventional radiology nurse under my direct supervision. FLUOROSCOPY TIME:  0 minutes, 36 seconds (5 mGy) COMPLICATIONS: None immediate. PROCEDURE: The right neck and chest was prepped with chlorhexidine, and draped in the usual sterile fashion using maximum barrier technique (cap and mask, sterile gown, sterile gloves, large sterile sheet, hand hygiene and cutaneous  antiseptic). Local anesthesia was attained by infiltration with 1% lidocaine with epinephrine. Ultrasound demonstrated patency of the right internal jugular vein, and this was documented with an image. Under real-time ultrasound guidance, this vein was accessed with a 21 gauge micropuncture needle and image documentation was performed. A small dermatotomy was made at the access site with an 11 scalpel. A 0.018" wire was advanced into the SVC and the access needle exchanged for a 34F micropuncture vascular sheath. The 0.018" wire was then removed and a 0.035" wire advanced into the IVC. An appropriate location for the subcutaneous reservoir was selected below the clavicle and an incision was made through the skin and underlying soft tissues. The subcutaneous tissues were then dissected using a combination of blunt and sharp surgical technique and a pocket was formed. A single lumen low-profile power injectable portacatheter was then tunneled through the subcutaneous tissues from the pocket to the dermatotomy and the port reservoir placed within the subcutaneous pocket. The venous access site was then serially dilated and a peel away vascular sheath placed over the wire. The wire was removed and the port catheter advanced into position under fluoroscopic guidance. The catheter tip is positioned in the superior cavoatrial junction. This was documented with a spot image. The portacatheter was then tested and found to flush and aspirate well. The port was flushed with saline followed by 100 units/mL heparinized saline. The pocket was then closed in two layers using first subdermal inverted interrupted absorbable sutures followed by a running subcuticular suture. The epidermis was then sealed with Dermabond. The dermatotomy at the venous access site was also closed with Dermabond. IMPRESSION: Successful placement of a right IJ approach Power Port with ultrasound and fluoroscopic guidance. The catheter is ready for use.  Electronically Signed   By: Jacqulynn Cadet M.D.   On: 03/07/2020 16:48   VAS [place] LOWER EXTREMITY VENOUS (DVT) (ONLY [facility] & [facility])  Result Date: 03/15/2020  Lower Venous DVTStudy Indications: Edema.  Limitations: Body habitus, poor ultrasound/tissue interface and patient position. Comparison Study: No prior study Performing Technologist: Maudry Mayhew MHA, RDMS, RVT, RDCS  Examination Guidelines: A complete evaluation includes B-mode imaging, spectral Doppler, color Doppler, and power Doppler as needed of all accessible portions of each vessel. Bilateral testing is considered an integral part of a complete examination. Limited examinations for reoccurring indications may be performed as noted. The reflux portion of the exam is performed with the patient in reverse Trendelenburg.  +---------+---------------+---------+-----------+----------+--------------+ RIGHT    CompressibilityPhasicitySpontaneityPropertiesThrombus Aging +---------+---------------+---------+-----------+----------+--------------+ CFV      Full           No       Yes                                 +---------+---------------+---------+-----------+----------+--------------+ SFJ      Full                                                        +---------+---------------+---------+-----------+----------+--------------+  FV Prox  Full                                                        +---------+---------------+---------+-----------+----------+--------------+ FV Mid   Full                                                        +---------+---------------+---------+-----------+----------+--------------+ FV DistalFull                                                        +---------+---------------+---------+-----------+----------+--------------+ PFV      Full                                                        +---------+---------------+---------+-----------+----------+--------------+ POP       Full           No       Yes                                 +---------+---------------+---------+-----------+----------+--------------+ PTV      Full                                                        +---------+---------------+---------+-----------+----------+--------------+ PERO     Full                                                        +---------+---------------+---------+-----------+----------+--------------+   +---------+---------------+---------+-----------+----------+--------------+ LEFT     CompressibilityPhasicitySpontaneityPropertiesThrombus Aging +---------+---------------+---------+-----------+----------+--------------+ CFV      Full           No       Yes                                 +---------+---------------+---------+-----------+----------+--------------+ SFJ      Full                                                        +---------+---------------+---------+-----------+----------+--------------+ FV Prox  Full                                                        +---------+---------------+---------+-----------+----------+--------------+  FV Mid   Full                                                        +---------+---------------+---------+-----------+----------+--------------+ FV DistalFull                                                        +---------+---------------+---------+-----------+----------+--------------+ PFV      Full                                                        +---------+---------------+---------+-----------+----------+--------------+ POP      Full           No       Yes                                 +---------+---------------+---------+-----------+----------+--------------+ PTV      Full                                                        +---------+---------------+---------+-----------+----------+--------------+ PERO     Full                                                         +---------+---------------+---------+-----------+----------+--------------+     Summary: RIGHT: - There is no evidence of deep vein thrombosis in the lower extremity.  - No cystic structure found in the popliteal fossa.  LEFT: - There is no evidence of deep vein thrombosis in the lower extremity.  - A cystic structure is found in the popliteal fossa.  Bilateral lower extremity veins exhibit pulsatile flow, suggestive of possibly elevated right heart pressure.  *See table(s) above for measurements and observations. Electronically signed by Curt Jews MD on 03/15/2020 at 3:12:07 PM.    Final    ECHOCARDIOGRAM LIMITED  Result Date: 03/16/2020    ECHOCARDIOGRAM LIMITED REPORT   Patient Name:   DEANDRIA KLUTE Date of Exam: 03/16/2020 Medical Rec #:  [id number]      Height:       66.0 in Accession #:    [id number]     Weight:       114.4 lb Date of Birth:  1941-01-25     BSA:          1.577 m Patient Age:    6 years       BP:           74/50 mmHg Patient Gender: F              HR:           78  bpm. Exam Location:  Inpatient Procedure: Limited Echo, Color Doppler and Cardiac Doppler Indications:    P59.45 Acute diastolic (congestive) heart failure  History:        Patient has prior history of Echocardiogram examinations, most                 recent 11/08/2019. CHF; Risk Factors:Hypertension.  Sonographer:    Raquel Sarna Senior RDCS Referring Phys: [id number] Harold Hedge  Sonographer Comments: Technically difficult due to small rib spacing. IMPRESSIONS  1. Abnormal septal motion/flattening inferior wall hypokinesis . Left ventricular ejection fraction, by estimation, is 35 to 40%. The left ventricle has moderately decreased function. The left ventricle demonstrates regional wall motion abnormalities (see scoring diagram/findings for description). The left ventricular internal cavity size was mildly dilated. Left ventricular diastolic parameters are indeterminate.  2. Marked RV dysfunction . Right ventricular  systolic function is severely reduced. The right ventricular size is moderately enlarged. There is moderately elevated pulmonary artery systolic pressure.  3. Left atrial size was moderately dilated.  4. Right atrial size was moderately dilated.  5. Mild mitral valve regurgitation.  6. Tricuspid valve regurgitation is moderate.  7. The aortic valve is tricuspid. Aortic valve regurgitation is trivial. Mild to moderate aortic valve sclerosis/calcification is present, without any evidence of aortic stenosis.  8. The inferior vena cava is dilated in size with <50% respiratory variability, suggesting right atrial pressure of 15 mmHg. FINDINGS  Left Ventricle: Abnormal septal motion/flattening inferior wall hypokinesis. Left ventricular ejection fraction, by estimation, is 35 to 40%. The left ventricle has moderately decreased function. The left ventricle demonstrates regional wall motion abnormalities. The left ventricular internal cavity size was mildly dilated. Right Ventricle: Marked RV dysfunction. The right ventricular size is moderately enlarged. Right vetricular wall thickness was not assessed. Right ventricular systolic function is severely reduced. There is moderately elevated pulmonary artery systolic pressure. The tricuspid regurgitant velocity is 2.78 m/s, and with an assumed right atrial pressure of 15 mmHg, the estimated right ventricular systolic pressure is 46.2 mmHg. Left Atrium: Left atrial size was moderately dilated. Right Atrium: Right atrial size was moderately dilated. Pericardium: There is no evidence of pericardial effusion. Mitral Valve: There is mild thickening of the mitral valve leaflet(s). There is mild calcification of the mitral valve leaflet(s). Mild mitral valve regurgitation. Tricuspid Valve: Tricuspid valve regurgitation is moderate. Aortic Valve: The aortic valve is tricuspid. Aortic valve regurgitation is trivial. Mild to moderate aortic valve sclerosis/calcification is present,  without any evidence of aortic stenosis. Pulmonic Valve: The pulmonic valve was not well visualized. Venous: The inferior vena cava is dilated in size with less than 50% respiratory variability, suggesting right atrial pressure of 15 mmHg. LEFT VENTRICLE PLAX 2D LVOT diam:     1.90 cm LV SV:         41 LV SV Index:   26 LVOT Area:     2.84 cm  LV Volumes (MOD) LV vol d, MOD A2C: 115.0 ml LV vol d, MOD A4C: 94.5 ml LV vol s, MOD A2C: 69.0 ml LV vol s, MOD A4C: 63.3 ml LV SV MOD A2C:     46.0 ml LV SV MOD A4C:     94.5 ml LV SV MOD BP:      35.1 ml RIGHT VENTRICLE RV S prime:     5.44 cm/s TAPSE (M-mode): 1.0 cm AORTIC VALVE LVOT Vmax:   75.10 cm/s LVOT Vmean:  53.400 cm/s LVOT VTI:    0.146 m TRICUSPID VALVE TR  Peak grad:   30.9 mmHg TR Vmax:        278.00 cm/s  SHUNTS Systemic VTI:  0.15 m Systemic Diam: 1.90 cm Jenkins Rouge MD Electronically signed by Jenkins Rouge MD Signature Date/Time: 03/16/2020/8:46:18 AM    Final       Discharge Exam: Vitals:   03/18/20 1400 03/18/20 1442  BP: (!) 98/52 102/65  Pulse: 83 86  Resp: 20 18  Temp:  98.5 F (36.9 C)  SpO2: 96% 100%   Vitals:   03/18/20 1300 03/18/20 1400 03/18/20 1442 03/18/20 1444  BP: (!) 89/51 (!) 98/52 102/65   Pulse: 82 83 86   Resp: 19 20 18    Temp:   98.5 F (36.9 C)   TempSrc:      SpO2: 96% 96% 100%   Weight:    53.3 kg  Height:    5' 6"  (1.676 m)    General: Pt is lethargic but comfortable Cardiovascular: Irregularly irregular rate and rhythm, S1/S2 +, no rubs, no gallops Respiratory: CTA bilaterally but diminished breath sounds at the bases, no wheezing, no rhonchi Abdominal: Soft, NT, ND, bowel sounds + Extremities: no edema, no cyanosis    The results of significant diagnostics from this hospitalization (including imaging, microbiology, ancillary and laboratory) are listed below for reference.     Microbiology: Recent Results (from the past 240 hour(s))  Aerobic Culture (superficial specimen)     Status:  Abnormal   Collection Time: 03/15/20 10:21 AM   Specimen: Leg  Result Value Ref Range Status   Specimen Description   Final    LEG RIGHT Performed at [facility] [address] [place], [place] [address]    Special Requests   Final    Immunocompromised Performed at [facility], [place] [address] [place], [place] [address]    Gram Stain   Final    NO WBC SEEN MODERATE GRAM VARIABLE ROD FEW GRAM POSITIVE COCCI FEW GRAM NEGATIVE RODS Performed at [facility], [place] [address] [place], [place] [address]    Culture MULTIPLE ORGANISMS PRESENT, NONE PREDOMINANT (A)  Final   Report Status 03/17/2020 FINAL  Final  SARS Coronavirus 2 by RT PCR (hospital order, performed in [facility] hospital lab) Nasopharyngeal Nasopharyngeal Swab     Status: None   Collection Time: 03/15/20 10:26 AM   Specimen: Nasopharyngeal Swab  Result Value Ref Range Status   SARS Coronavirus 2 NEGATIVE NEGATIVE Final    Comment: (NOTE) SARS-CoV-2 target nucleic acids are NOT DETECTED.  The SARS-CoV-2 RNA is generally detectable in upper and lower respiratory specimens during the acute phase of infection. The lowest concentration of SARS-CoV-2 viral copies this assay can detect is 250 copies / mL. A negative result does not preclude SARS-CoV-2 infection and should not be used as the sole basis for treatment or other patient management decisions.  A negative result may occur with improper specimen collection / handling, submission of specimen other than nasopharyngeal swab, presence of viral mutation(s) within the areas targeted by this assay, and inadequate number of viral copies (<250 copies / mL). A negative result must be combined with clinical observations, patient history, and epidemiological information.  Fact Sheet for Patients:   StrictlyIdeas.no  Fact Sheet for Healthcare  Providers: BankingDealers.co.za  This test is not yet approved or  cleared by the [place] FDA and has been authorized for detection and/or diagnosis of SARS-CoV-2 by FDA under an Emergency Use Authorization (EUA).  This EUA  will remain in effect (meaning this test can be used) for the duration of the COVID-19 declaration under Section 564(b)(1) of the Act, 21 U.S.C. section 360bbb-3(b)(1), unless the authorization is terminated or revoked sooner.  Performed at [facility], [place] [address], [place], [place] [address]   Culture, blood (routine x 2)     Status: Abnormal   Collection Time: 03/15/20 11:10 AM   Specimen: BLOOD  Result Value Ref Range Status   Specimen Description   Final    BLOOD RIGHT ANTECUBITAL Performed at [facility] [address], [place], [place] [address]    Special Requests   Final    BOTTLES DRAWN AEROBIC AND ANAEROBIC Blood Culture adequate volume Performed at [facility] [address] [place], [place] [address]    Culture  Setup Time   Final    IN BOTH AEROBIC AND ANAEROBIC BOTTLES GRAM NEGATIVE RODS CRITICAL RESULT CALLED TO, READ BACK BY AND VERIFIED WITH: Sheffield Slider [facility] 03/16/20 0600 JDW Performed at [facility], [address], [place],  [address]    Culture PSEUDOMONAS AERUGINOSA (A)  Final   Report Status 03/18/2020 FINAL  Final   Organism ID, Bacteria PSEUDOMONAS AERUGINOSA  Final      Susceptibility   Pseudomonas aeruginosa - MIC*    CEFTAZIDIME 4 SENSITIVE Sensitive     CIPROFLOXACIN <=0.25 SENSITIVE Sensitive     GENTAMICIN <=1 SENSITIVE Sensitive     IMIPENEM 2 SENSITIVE Sensitive     PIP/TAZO 8 SENSITIVE Sensitive     CEFEPIME 4 SENSITIVE Sensitive     * PSEUDOMONAS AERUGINOSA  Blood Culture ID Panel (Reflexed)     Status: Abnormal   Collection Time: 03/15/20 11:10 AM  Result Value Ref Range Status   Enterococcus faecalis NOT  DETECTED NOT DETECTED Final   Enterococcus Faecium NOT DETECTED NOT DETECTED Final   Listeria monocytogenes NOT DETECTED NOT DETECTED Final   Staphylococcus species NOT DETECTED NOT DETECTED Final   Staphylococcus aureus (BCID) NOT DETECTED NOT DETECTED Final   Staphylococcus epidermidis NOT DETECTED NOT DETECTED Final   Staphylococcus lugdunensis NOT DETECTED NOT DETECTED Final   Streptococcus species NOT DETECTED NOT DETECTED Final   Streptococcus agalactiae NOT DETECTED NOT DETECTED Final   Streptococcus pneumoniae NOT DETECTED NOT DETECTED Final   Streptococcus pyogenes NOT DETECTED NOT DETECTED Final   A.calcoaceticus-baumannii NOT DETECTED NOT DETECTED Final   Bacteroides fragilis NOT DETECTED NOT DETECTED Final   Enterobacterales NOT DETECTED NOT DETECTED Final   Enterobacter cloacae complex NOT DETECTED NOT DETECTED Final   Escherichia coli NOT DETECTED NOT DETECTED Final   Klebsiella aerogenes NOT DETECTED NOT DETECTED Final   Klebsiella oxytoca NOT DETECTED NOT DETECTED Final   Klebsiella pneumoniae NOT DETECTED NOT DETECTED Final   Proteus species NOT DETECTED NOT DETECTED Final   Salmonella species NOT DETECTED NOT DETECTED Final   Serratia marcescens NOT DETECTED NOT DETECTED Final   Haemophilus influenzae NOT DETECTED NOT DETECTED Final   Neisseria meningitidis NOT DETECTED NOT DETECTED Final   Pseudomonas aeruginosa DETECTED (A) NOT DETECTED Final    Comment: CRITICAL RESULT CALLED TO, READ BACK BY AND VERIFIED WITH: Sheffield Slider [facility] 03/16/20 0600 JDW    Stenotrophomonas maltophilia NOT DETECTED NOT DETECTED Final   Candida albicans NOT DETECTED NOT DETECTED Final   Candida auris NOT DETECTED NOT DETECTED Final   Candida glabrata NOT DETECTED NOT DETECTED Final   Candida krusei NOT DETECTED NOT DETECTED Final  Candida parapsilosis NOT DETECTED NOT DETECTED Final   Candida tropicalis NOT DETECTED NOT DETECTED Final   Cryptococcus neoformans/gattii NOT DETECTED NOT  DETECTED Final   CTX-M ESBL NOT DETECTED NOT DETECTED Final   Carbapenem resistance IMP NOT DETECTED NOT DETECTED Final   Carbapenem resistance KPC NOT DETECTED NOT DETECTED Final   Carbapenem resistance NDM NOT DETECTED NOT DETECTED Final   Carbapenem resistance VIM NOT DETECTED NOT DETECTED Final    Comment: Performed at [facility], [place] [address] [place], [place] [address]  MRSA PCR Screening     Status: None   Collection Time: 03/16/20  2:30 PM   Specimen: Nasopharyngeal  Result Value Ref Range Status   MRSA by PCR NEGATIVE NEGATIVE Final    Comment:        The GeneXpert MRSA Assay (FDA approved for NASAL specimens only), is one component of a comprehensive MRSA colonization surveillance program. It is not intended to diagnose MRSA infection nor to guide or monitor treatment for MRSA infections. Performed at [facility], [place] [address] [place], [place] [address]   Culture, blood (routine x 2)     Status: None (Preliminary result)   Collection Time: 03/17/20  2:54 AM   Specimen: BLOOD  Result Value Ref Range Status   Specimen Description   Final    BLOOD BLOOD RIGHT HAND Performed at [facility] [address] [place], [place] [address]    Special Requests   Final    BOTTLES DRAWN AEROBIC ONLY Blood Culture adequate volume Performed at [facility] [address] [place], [place] [address]    Culture   Final    NO GROWTH 2 DAYS Performed at [place] [address] [place], [place] [address]    Report Status PENDING  Incomplete  Culture, blood (single)     Status: None (Preliminary result)   Collection Time: 03/17/20  2:54 AM   Specimen: BLOOD  Result Value Ref Range Status   Specimen Description   Final    BLOOD BLOOD RIGHT ARM Performed at [place] [facility] [address] [place], [place] [address]    Special Requests   Final    BOTTLES DRAWN AEROBIC AND ANAEROBIC  Blood Culture adequate volume Performed at [facility] [address], [place], [place] [address]    Culture   Final    NO GROWTH 2 DAYS Performed at [facility] [address], [place], [place] [address]    Report Status PENDING  Incomplete  Body fluid culture     Status: None (Preliminary result)   Collection Time: 03/17/20 12:32 PM   Specimen: Thoracentesis; Body Fluid  Result Value Ref Range Status   Specimen Description   Final    THORACENTESIS Performed at [place] [address] [place], [place] [address]    Special Requests   Final    NONE Performed at [facility], [place] [address], [place], [place] [address]    Gram Stain   Final    FEW WBC PRESENT, PREDOMINANTLY MONONUCLEAR NO ORGANISMS SEEN    Culture   Final    NO GROWTH 2 DAYS Performed at [facility] [address] [place],  [address]    Report Status PENDING  Incomplete     Labs: BNP (last 3 results) Recent Labs    03/15/20 1021  BNP 5,885.0*   Basic Metabolic Panel:  Recent Labs  Lab 03/15/20 1021 03/16/20 1100 03/17/20 0254 03/18/20 0523  NA 138 138 137 140  K 3.8 3.6 3.3* 4.0  CL 108 109 106 111  CO2 19* 19* 19* 20*  GLUCOSE 121* 97 180* 143*  BUN 23 31* 38* 47*  CREATININE 0.80 1.17* 1.05* 0.94  CALCIUM 9.1 8.4* 8.4* 8.8*  MG  --   --  2.2 2.4   Liver Function Tests: Recent Labs  Lab 03/17/20 1320  PROT 6.0*   No results for input(s): LIPASE, AMYLASE in the last 168 hours. No results for input(s): AMMONIA in the last 168 hours. CBC: Recent Labs  Lab 03/15/20 1021 03/16/20 0551 03/17/20 0254 03/18/20 0523  WBC 7.4 2.3* 4.2 6.1  NEUTROABS 6.3  --   --  5.4  HGB 8.8* 8.1* 7.8* 7.1*  HCT 26.9* 24.1* 23.8* 22.0*  MCV 106.3* 105.2* 105.8* 105.8*  PLT 131* 102* 98* 98*   Cardiac Enzymes: No results for input(s): CKTOTAL, CKMB, CKMBINDEX, TROPONINI in the last 168 hours. BNP: Invalid  input(s): POCBNP CBG: No results for input(s): GLUCAP in the last 168 hours. D-Dimer No results for input(s): DDIMER in the last 72 hours. Hgb A1c No results for input(s): HGBA1C in the last 72 hours. Lipid Profile Recent Labs    03/17/20 1320  CHOL 87   Thyroid function studies No results for input(s): TSH, T4TOTAL, T3FREE, THYROIDAB in the last 72 hours.  Invalid input(s): FREET3 Anemia work up No results for input(s): VITAMINB12, FOLATE, FERRITIN, TIBC, IRON, RETICCTPCT in the last 72 hours. Urinalysis    Component Value Date/Time   COLORURINE YELLOW 08/18/2018 1313   APPEARANCEUR CLEAR 08/18/2018 1313   LABSPEC 1.013 08/18/2018 1313   LABSPEC 1.015 08/20/2012 1124   PHURINE 7.0 08/18/2018 1313   GLUCOSEU NEGATIVE 08/18/2018 1313   GLUCOSEU Negative 08/20/2012 1124   HGBUR NEGATIVE 08/18/2018 1313   HGBUR negative 10/22/2007 1336   BILIRUBINUR NEGATIVE 08/18/2018 1313   BILIRUBINUR Negative 08/20/2012 1124   KETONESUR 5 (A) 08/18/2018 1313   PROTEINUR 30 (A) 08/18/2018 1313   UROBILINOGEN 0.2 09/14/2014 2100   UROBILINOGEN 0.2 08/20/2012 1124   NITRITE NEGATIVE 08/18/2018 1313   LEUKOCYTESUR NEGATIVE 08/18/2018 1313   LEUKOCYTESUR Small 08/20/2012 1124   Sepsis Labs Invalid input(s): PROCALCITONIN,  WBC,  LACTICIDVEN Microbiology Recent Results (from the past 240 hour(s))  Aerobic Culture (superficial specimen)     Status: Abnormal   Collection Time: 03/15/20 10:21 AM   Specimen: Leg  Result Value Ref Range Status   Specimen Description   Final    LEG RIGHT Performed at [facility] [address] [place], [place] [address]    Special Requests   Final    Immunocompromised Performed at [facility], [place] [address] [place], [place] [address]    Gram Stain   Final    NO WBC SEEN MODERATE GRAM VARIABLE ROD FEW GRAM POSITIVE COCCI FEW GRAM NEGATIVE RODS Performed at [facility], [place] [address],  [place], [place] [address]    Culture MULTIPLE ORGANISMS PRESENT, NONE PREDOMINANT (A)  Final   Report Status 03/17/2020 FINAL  Final  SARS Coronavirus 2 by RT PCR (hospital order, performed in [facility] hospital lab) Nasopharyngeal Nasopharyngeal Swab     Status: None   Collection Time: 03/15/20 10:26 AM   Specimen: Nasopharyngeal Swab  Result Value Ref Range Status   SARS Coronavirus 2 NEGATIVE NEGATIVE Final    Comment: (NOTE) SARS-CoV-2 target nucleic  acids are NOT DETECTED.  The SARS-CoV-2 RNA is generally detectable in upper and lower respiratory specimens during the acute phase of infection. The lowest concentration of SARS-CoV-2 viral copies this assay can detect is 250 copies / mL. A negative result does not preclude SARS-CoV-2 infection and should not be used as the sole basis for treatment or other patient management decisions.  A negative result may occur with improper specimen collection / handling, submission of specimen other than nasopharyngeal swab, presence of viral mutation(s) within the areas targeted by this assay, and inadequate number of viral copies (<250 copies / mL). A negative result must be combined with clinical observations, patient history, and epidemiological information.  Fact Sheet for Patients:   StrictlyIdeas.no  Fact Sheet for Healthcare Providers: BankingDealers.co.za  This test is not yet approved or  cleared by the [place] FDA and has been authorized for detection and/or diagnosis of SARS-CoV-2 by FDA under an Emergency Use Authorization (EUA).  This EUA will remain in effect (meaning this test can be used) for the duration of the COVID-19 declaration under Section 564(b)(1) of the Act, 21 U.S.C. section 360bbb-3(b)(1), unless the authorization is terminated or revoked sooner.  Performed at [facility], [place] [address] [place], [place] [address]   Culture, blood  (routine x 2)     Status: Abnormal   Collection Time: 03/15/20 11:10 AM   Specimen: BLOOD  Result Value Ref Range Status   Specimen Description   Final    BLOOD RIGHT ANTECUBITAL Performed at [place] [address] [place], [place] [address]    Special Requests   Final    BOTTLES DRAWN AEROBIC AND ANAEROBIC Blood Culture adequate volume Performed at [facility] [address] [place], [place] [address]    Culture  Setup Time   Final    IN BOTH AEROBIC AND ANAEROBIC BOTTLES GRAM NEGATIVE RODS CRITICAL RESULT CALLED TO, READ BACK BY AND VERIFIED WITH: Sheffield Slider [facility] 03/16/20 0600 JDW Performed at [facility], [name] [place], [place] [address]    Culture PSEUDOMONAS AERUGINOSA (A)  Final   Report Status 03/18/2020 FINAL  Final   Organism ID, Bacteria PSEUDOMONAS AERUGINOSA  Final      Susceptibility   Pseudomonas aeruginosa - MIC*    CEFTAZIDIME 4 SENSITIVE Sensitive     CIPROFLOXACIN <=0.25 SENSITIVE Sensitive     GENTAMICIN <=1 SENSITIVE Sensitive     IMIPENEM 2 SENSITIVE Sensitive     PIP/TAZO 8 SENSITIVE Sensitive     CEFEPIME 4 SENSITIVE Sensitive     * PSEUDOMONAS AERUGINOSA  Blood Culture ID Panel (Reflexed)     Status: Abnormal   Collection Time: 03/15/20 11:10 AM  Result Value Ref Range Status   Enterococcus faecalis NOT DETECTED NOT DETECTED Final   Enterococcus Faecium NOT DETECTED NOT DETECTED Final   Listeria monocytogenes NOT DETECTED NOT DETECTED Final   Staphylococcus species NOT DETECTED NOT DETECTED Final   Staphylococcus aureus (BCID) NOT DETECTED NOT DETECTED Final   Staphylococcus epidermidis NOT DETECTED NOT DETECTED Final   Staphylococcus lugdunensis NOT DETECTED NOT DETECTED Final   Streptococcus species NOT DETECTED NOT DETECTED Final   Streptococcus agalactiae NOT DETECTED NOT DETECTED Final   Streptococcus pneumoniae NOT DETECTED NOT DETECTED Final   Streptococcus pyogenes NOT DETECTED  NOT DETECTED Final   A.calcoaceticus-baumannii NOT DETECTED NOT DETECTED Final   Bacteroides fragilis NOT DETECTED NOT DETECTED Final   Enterobacterales NOT DETECTED NOT DETECTED  Final   Enterobacter cloacae complex NOT DETECTED NOT DETECTED Final   Escherichia coli NOT DETECTED NOT DETECTED Final   Klebsiella aerogenes NOT DETECTED NOT DETECTED Final   Klebsiella oxytoca NOT DETECTED NOT DETECTED Final   Klebsiella pneumoniae NOT DETECTED NOT DETECTED Final   Proteus species NOT DETECTED NOT DETECTED Final   Salmonella species NOT DETECTED NOT DETECTED Final   Serratia marcescens NOT DETECTED NOT DETECTED Final   Haemophilus influenzae NOT DETECTED NOT DETECTED Final   Neisseria meningitidis NOT DETECTED NOT DETECTED Final   Pseudomonas aeruginosa DETECTED (A) NOT DETECTED Final    Comment: CRITICAL RESULT CALLED TO, READ BACK BY AND VERIFIED WITH: Sheffield Slider [facility] 03/16/20 0600 JDW    Stenotrophomonas maltophilia NOT DETECTED NOT DETECTED Final   Candida albicans NOT DETECTED NOT DETECTED Final   Candida auris NOT DETECTED NOT DETECTED Final   Candida glabrata NOT DETECTED NOT DETECTED Final   Candida krusei NOT DETECTED NOT DETECTED Final   Candida parapsilosis NOT DETECTED NOT DETECTED Final   Candida tropicalis NOT DETECTED NOT DETECTED Final   Cryptococcus neoformans/gattii NOT DETECTED NOT DETECTED Final   CTX-M ESBL NOT DETECTED NOT DETECTED Final   Carbapenem resistance IMP NOT DETECTED NOT DETECTED Final   Carbapenem resistance KPC NOT DETECTED NOT DETECTED Final   Carbapenem resistance NDM NOT DETECTED NOT DETECTED Final   Carbapenem resistance VIM NOT DETECTED NOT DETECTED Final    Comment: Performed at [facility], [place] [address], [place], [place] [address]  MRSA PCR Screening     Status: None   Collection Time: 03/16/20  2:30 PM   Specimen: Nasopharyngeal  Result Value Ref Range Status   MRSA by PCR NEGATIVE NEGATIVE Final    Comment:        The GeneXpert  MRSA Assay (FDA approved for NASAL specimens only), is one component of a comprehensive MRSA colonization surveillance program. It is not intended to diagnose MRSA infection nor to guide or monitor treatment for MRSA infections. Performed at [facility], [place] [address] [place], [place] [address]   Culture, blood (routine x 2)     Status: None (Preliminary result)   Collection Time: 03/17/20  2:54 AM   Specimen: BLOOD  Result Value Ref Range Status   Specimen Description   Final    BLOOD BLOOD RIGHT HAND Performed at [facility] [address] [place], [place] [address]    Special Requests   Final    BOTTLES DRAWN AEROBIC ONLY Blood Culture adequate volume Performed at [place] [address] [place], [place] [address]    Culture   Final    NO GROWTH 2 DAYS Performed at [place] [address], [place], [place] [address]    Report Status PENDING  Incomplete  Culture, blood (single)     Status: None (Preliminary result)   Collection Time: 03/17/20  2:54 AM   Specimen: BLOOD  Result Value Ref Range Status   Specimen Description   Final    BLOOD BLOOD RIGHT ARM Performed at [facility] [address] [place], [place] [address]    Special Requests   Final    BOTTLES DRAWN AEROBIC AND ANAEROBIC Blood Culture adequate volume Performed at [place] [address], [place], [place] [address]    Culture   Final    NO GROWTH 2 DAYS Performed at [place],  [place] [address]    Report Status PENDING  Incomplete  Body fluid culture     Status: None (Preliminary result)   Collection Time: 03/17/20 12:32 PM   Specimen: Thoracentesis; Body Fluid  Result Value Ref Range Status   Specimen Description   Final    THORACENTESIS Performed at [facility] [address], [place], [place] [address]    Special  Requests   Final    NONE Performed at [facility], [place] [address] [place], [place] [address]    Gram Stain   Final    FEW WBC PRESENT, PREDOMINANTLY MONONUCLEAR NO ORGANISMS SEEN    Culture   Final    NO GROWTH 2 DAYS Performed at [place] [address] [place], [place] [address]    Report Status PENDING  Incomplete     Time coordinating discharge: Over 30 minutes  SIGNED:   Darliss Cheney, MD  [facility] 03/19/2020, 1:19 PM  If 7PM-7AM, please contact night-coverage [email or web address]

## 2020-03-21 LAB — BODY FLUID CULTURE: Culture: NO GROWTH

## 2020-03-22 LAB — CULTURE, BLOOD (ROUTINE X 2)
Culture: NO GROWTH
Special Requests: ADEQUATE

## 2020-03-22 LAB — CULTURE, BLOOD (SINGLE)
Culture: NO GROWTH
Special Requests: ADEQUATE

## 2020-03-22 LAB — CYTOLOGY - NON PAP

## 2020-03-23 ENCOUNTER — Inpatient Hospital Stay: Payer: Medicare Other

## 2020-03-23 ENCOUNTER — Inpatient Hospital Stay: Payer: Medicare Other | Admitting: Hematology

## 2020-03-23 LAB — CHOLESTEROL, BODY FLUID: Cholesterol, Fluid: 27 mg/dL

## 2020-03-30 ENCOUNTER — Ambulatory Visit: Payer: Medicare Other

## 2020-03-30 ENCOUNTER — Other Ambulatory Visit: Payer: Medicare Other

## 2020-04-06 ENCOUNTER — Ambulatory Visit: Payer: Medicare Other | Admitting: Hematology

## 2020-04-06 ENCOUNTER — Other Ambulatory Visit: Payer: Medicare Other

## 2020-04-06 ENCOUNTER — Ambulatory Visit: Payer: Medicare Other

## 2020-04-14 DEATH — deceased

## 2020-06-28 ENCOUNTER — Ambulatory Visit: Payer: Medicare Other | Admitting: Cardiology

## 2020-10-12 ENCOUNTER — Other Ambulatory Visit (HOSPITAL_COMMUNITY): Payer: Self-pay

## 8387-03-16 DEATH — deceased
